# Patient Record
Sex: Female | Born: 1960 | Race: White | Hispanic: No | Smoking: Current some day smoker
Health system: Southern US, Community
[De-identification: ages and names within clinical notes are randomized; demographics above are authoritative.]

## PROBLEM LIST (undated history)

## (undated) ENCOUNTER — Emergency Department (HOSPITAL_COMMUNITY): Admission: EM | Payer: Self-pay | Source: Home / Self Care

## (undated) DIAGNOSIS — J449 Chronic obstructive pulmonary disease, unspecified: Secondary | ICD-10-CM

## (undated) DIAGNOSIS — C140 Malignant neoplasm of pharynx, unspecified: Secondary | ICD-10-CM

## (undated) DIAGNOSIS — K449 Diaphragmatic hernia without obstruction or gangrene: Secondary | ICD-10-CM

## (undated) DIAGNOSIS — M5136 Other intervertebral disc degeneration, lumbar region: Secondary | ICD-10-CM

## (undated) DIAGNOSIS — J40 Bronchitis, not specified as acute or chronic: Secondary | ICD-10-CM

## (undated) DIAGNOSIS — M51369 Other intervertebral disc degeneration, lumbar region without mention of lumbar back pain or lower extremity pain: Secondary | ICD-10-CM

## (undated) DIAGNOSIS — I10 Essential (primary) hypertension: Secondary | ICD-10-CM

## (undated) HISTORY — PX: OTHER SURGICAL HISTORY: SHX169

## (undated) HISTORY — PX: CHOLECYSTECTOMY: SHX55

## (undated) NOTE — *Deleted (*Deleted)
PROGRESS NOTE  HEIDIE KELLEN  DOB: 1960/06/10  PCP: [facility] [id number]  DOA: 03/10/2020  LOS: 7 days   Chief Complaint  Patient presents with  . Shortness of Breath   *** Brief narrative: Veronica Horton is a 75 y.o. female who presented to the ED on 03/10/2020 with shortness of breath. PMH significant for COPD on 5 L oxygen at home, hypertension. Initially admitted as COPD exacerbation on BiPAP. She underwent speech evaluation.  Barium study showed supraglottic soft tissue mass. CT scan of the neck showed a bulky soft tissue mass in the supraglottis with narrowing of the airway, involvement of the right laryngeal cartilage with extension to the strap muscles in the right paraglottic space, questionable bilateral cervical adenopathy concerning for regional metastasis. Oncology and ENT were consulted.   11/13, ENT did direct laryngoscopy with biopsy and tracheostomy on 11/13 and required transfer to intensive care. 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 11/15, she was transitioned to trach collar.  NG tube was placed for feeding. 11/15, CT chest/abdomen/pelvis with contrast did not show any distant metastatic disease to the chest, abdomen or pelvis.  11/16, Transferred out to hospitalist service  Subjective: Patient was seen and examined *** Chart reviewed. No fever, hemodynamically stable, on 10 L oxygen via trach collar.  Assessment/Plan: Acute on chronic hypoxic respiratory failure -Chronically on oxygen 5 L at home because of COPD. -Acute worsening of respiratory status secondary to laryngeal cancer.  Underwent tracheostomy.  Currently on 2 L by nasal cannula.   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 -Per oncology note from this morning, this was discussed at ENT tumor board.   Plan is to proceed with concurrent chemoradiation.  MRI neck with and without contrast ordered for staging and radiation planning.  Recommendation is to transfer her to [facility] for further treatment. -Currently on Solu-Medrol to 40 milligrams IV daily -continue nebs and Breo, flutter valve and incentive spirometry -Attempt trach collar 24 hours, will involve speech and swallow to advance with PM valve  Aspiration pneumonia -Serratia marcescens isolated in sputum culture -continue Levaquin plan 7 to 10 days  GERD -on PPI  HTN -Currently controlled without home Lasix or lisinopril  Mild AKI -improving Recent Labs    01/23/20 0234 01/24/20 0425 03/10/20 1226 03/12/20 0352 03/13/20 0849 03/14/20 0824 03/15/20 0105 03/16/20 0613 03/17/20 0245  BUN 13 23* 16 33* 37* 39* 37* 35* 31*  CREATININE 0.92 0.94 1.01* 1.10* 1.16* 1.10* 1.20* 1.13* 1.10*   Rib fractures -CT chest noted ununited fractures along posterior LEFT chest involving ribs 10 through 12. These appear subacute or chronic. These did not appear to be present on previous imaging from September of 2020. -***     Mobility: *** Code Status:   Code Status: Full Code *** Nutritional status: Body mass index is 41.27 kg/m. Nutrition Problem: Increased nutrient needs Etiology: cancer and cancer related treatments, chronic illness Signs/Symptoms: estimated needs Diet Order            Diet NPO time specified  Diet effective now                *** DVT prophylaxis: SCDs Start: 03/10/20 1625   Antimicrobials: *** Fluid: *** Consultants: *** Family Communication: ***  Status is: Inpatient  {Inpatient:23812}  Dispo: The patient is from: {From:23814}  Anticipated d/c is to: {To:23815}              Anticipated d/c date is: {Days:23816}              Patient currently {Medically stable:23817}       Infusions:  . feeding supplement (OSMOLITE 1.5 CAL) 1,000 mL (03/16/20 1901)  .  levofloxacin (LEVAQUIN) IV 750 mg (03/16/20 0921)    Scheduled Meds: . arformoterol  15 mcg Nebulization BID  . budesonide (PULMICORT) nebulizer solution  0.5 mg Nebulization BID  . chlorhexidine  15 mL Mouth Rinse BID  . chlorhexidine gluconate (MEDLINE KIT)  15 mL Mouth Rinse BID  . Chlorhexidine Gluconate Cloth  6 each Topical Daily  . dextromethorphan-guaiFENesin  1 tablet Oral BID  . enoxaparin (LOVENOX) injection  55 mg Subcutaneous Q24H  . feeding supplement (PROSource TF)  45 mL Per Tube BID  . insulin aspart  0-9 Units Subcutaneous Q4H  . ipratropium-albuterol  3 mL Nebulization Q6H  . mouth rinse  15 mL Mouth Rinse 10 times per day  . methylPREDNISolone (SOLU-MEDROL) injection  40 mg Intravenous Daily  . multivitamin with minerals  1 tablet Oral Daily  . nicotine  21 mg Transdermal Daily  . pantoprazole (PROTONIX) IV  40 mg Intravenous Q24H  . pneumococcal 23 valent vaccine  0.5 mL Intramuscular Tomorrow-1000    Antimicrobials: Anti-infectives (From admission, onward)   Start     Dose/Rate Route Frequency Ordered Stop   03/14/20 1100  levofloxacin (LEVAQUIN) IVPB 750 mg        750 mg 100 mL/hr over 90 Minutes Intravenous Daily 03/14/20 0955 03/20/20 0959   03/10/20 1730  doxycycline (VIBRAMYCIN) 100 mg in sodium chloride 0.9 % 250 mL IVPB  Status:  Discontinued        100 mg 125 mL/hr over 120 Minutes Intravenous Every 12 hours 03/10/20 1634 03/14/20 0955   03/10/20 1600  azithromycin (ZITHROMAX) 500 mg in sodium chloride 0.9 % 250 mL IVPB  Status:  Discontinued        500 mg 250 mL/hr over 60 Minutes Intravenous Every 24 hours 03/10/20 1529 03/10/20 1538   03/10/20 1500  clindamycin (CLEOCIN) IVPB 600 mg  Status:  Discontinued        600 mg 100 mL/hr over 30 Minutes Intravenous  Once 03/10/20 1458 03/10/20 1521      PRN meds: fentaNYL (SUBLIMAZE) injection, ipratropium-albuterol   Objective: Vitals:   03/17/20 0729 03/17/20 0755  BP: 112/68   Pulse: 93    Resp: (!) 21   Temp:  98 F (36.7 C)  SpO2:      Intake/Output Summary (Last 24 hours) at 03/17/2020 0836 Last data filed at 03/17/2020 0600 Gross per 24 hour  Intake 330 ml  Output 900 ml  Net -570 ml   Filed Weights   03/14/20 0500 03/16/20 0500 03/17/20 0500  Weight: 111.7 kg 112.4 kg 112.5 kg   Weight change: 0.1 kg Body mass index is 41.27 kg/m.   Physical Exam: General exam: Appears calm and comfortable. *** Skin: No rashes, lesions or ulcers. HEENT: Atraumatic, normocephalic, supple neck, no obvious bleeding Lungs: *** CVS: *** GI/Abd *** CNS: *** Psychiatry: *** Extremities: ***  Data Review: I have personally reviewed the laboratory data and studies available.  Recent Labs  Lab 03/10/20 1227 03/12/20 0352 03/13/20 0849 03/14/20 0824 03/15/20 0105 03/16/20 0613 03/17/20 0245  WBC 10.1   < > 10.2 10.6* 8.2 6.9 9.5  NEUTROABS 8.1*  --   --   --  7.3 5.5 7.4  HGB 14.5   < > 15.0 15.5* 14.6 14.1 14.5  HCT 47.2*   < > 47.4* 47.8* 45.4 44.8 46.2*  MCV 101.9*   < > 97.1 98.0 98.3 98.2 99.1  PLT 220   < > 208 203 146* 121* 112*   < > = values in this interval not displayed.   Recent Labs  Lab 03/10/20 1600 03/12/20 0352 03/13/20 0849 03/14/20 0824 03/15/20 0105 03/15/20 1621 03/15/20 1728 03/16/20 0613 03/17/20 0245  NA  --    < > 140 141 141  --   --  144 145  K  --    < > 4.3 4.5 4.3  --   --  3.9 4.3  CL  --    < > 98 101 104  --   --  105 104  CO2  --    < > 32 28 30  --   --  30 32  GLUCOSE  --    < > 103* 112* 108*  --   --  126* 116*  BUN  --    < > 37* 39* 37*  --   --  35* 31*  CREATININE  --    < > 1.16* 1.10* 1.20*  --   --  1.13* 1.10*  CALCIUM  --    < > 9.3 8.9 8.6*  --   --  8.8* 8.6*  MG 2.3  --   --   --   --  2.7* 2.7* 2.6* 2.6*  PHOS  --   --   --   --   --  3.2 3.4 3.2 3.1   < > = values in this interval not displayed.    F/u labs ***  Signed, Terrilee Croak, MD [facility] 03/17/2020

---

## 2001-03-16 ENCOUNTER — Emergency Department (HOSPITAL_COMMUNITY): Admission: EM | Admit: 2001-03-16 | Discharge: 2001-03-16 | Payer: Self-pay | Admitting: *Deleted

## 2001-08-04 ENCOUNTER — Emergency Department (HOSPITAL_COMMUNITY): Admission: EM | Admit: 2001-08-04 | Discharge: 2001-08-04 | Payer: Self-pay | Admitting: *Deleted

## 2001-08-04 ENCOUNTER — Encounter: Payer: Self-pay | Admitting: *Deleted

## 2007-03-09 ENCOUNTER — Emergency Department (HOSPITAL_COMMUNITY): Admission: EM | Admit: 2007-03-09 | Discharge: 2007-03-09 | Payer: Self-pay | Admitting: Emergency Medicine

## 2007-12-03 ENCOUNTER — Emergency Department (HOSPITAL_COMMUNITY): Admission: EM | Admit: 2007-12-03 | Discharge: 2007-12-03 | Payer: Self-pay | Admitting: Emergency Medicine

## 2010-09-25 ENCOUNTER — Emergency Department (HOSPITAL_COMMUNITY)
Admission: EM | Admit: 2010-09-25 | Discharge: 2010-09-25 | Disposition: A | Discharge status: Home / Self Care | Payer: Self-pay | Attending: Emergency Medicine | Admitting: Emergency Medicine

## 2010-09-25 ENCOUNTER — Emergency Department (HOSPITAL_COMMUNITY): Payer: Self-pay

## 2010-09-25 DIAGNOSIS — K439 Ventral hernia without obstruction or gangrene: Secondary | ICD-10-CM | POA: Insufficient documentation

## 2010-09-25 DIAGNOSIS — R1013 Epigastric pain: Secondary | ICD-10-CM | POA: Insufficient documentation

## 2010-09-25 DIAGNOSIS — F17200 Nicotine dependence, unspecified, uncomplicated: Secondary | ICD-10-CM | POA: Insufficient documentation

## 2010-09-25 LAB — DIFFERENTIAL
Basophils Absolute: 0 10*3/uL (ref 0.0–0.1)
Basophils Relative: 0 % (ref 0–1)
Eosinophils Absolute: 0.1 10*3/uL (ref 0.0–0.7)
Eosinophils Relative: 0 % (ref 0–5)
Lymphocytes Relative: 11 % — ABNORMAL LOW (ref 12–46)
Lymphs Abs: 1.4 10*3/uL (ref 0.7–4.0)
Monocytes Absolute: 0.8 10*3/uL (ref 0.1–1.0)
Monocytes Relative: 6 % (ref 3–12)
Neutro Abs: 10 10*3/uL — ABNORMAL HIGH (ref 1.7–7.7)
Neutrophils Relative %: 82 % — ABNORMAL HIGH (ref 43–77)

## 2010-09-25 LAB — COMPREHENSIVE METABOLIC PANEL
Alkaline Phosphatase: 73 U/L (ref 39–117)
BUN: 19 mg/dL (ref 6–23)
Chloride: 98 mEq/L (ref 96–112)
Glucose, Bld: 116 mg/dL — ABNORMAL HIGH (ref 70–99)
Potassium: 3.1 mEq/L — ABNORMAL LOW (ref 3.5–5.1)
Total Bilirubin: 0.5 mg/dL (ref 0.3–1.2)

## 2010-09-25 LAB — CBC
HCT: 49.3 % — ABNORMAL HIGH (ref 36.0–46.0)
Hemoglobin: 16.9 g/dL — ABNORMAL HIGH (ref 12.0–15.0)
MCH: 33.1 pg (ref 26.0–34.0)
MCHC: 34.3 g/dL (ref 30.0–36.0)
MCV: 96.5 fL (ref 78.0–100.0)
Platelets: 204 10*3/uL (ref 150–400)
RBC: 5.11 MIL/uL (ref 3.87–5.11)
RDW: 13.8 % (ref 11.5–15.5)
WBC: 12.3 10*3/uL — ABNORMAL HIGH (ref 4.0–10.5)

## 2010-09-25 LAB — URINALYSIS, ROUTINE W REFLEX MICROSCOPIC
Ketones, ur: NEGATIVE mg/dL
Nitrite: NEGATIVE
Protein, ur: NEGATIVE mg/dL
Urobilinogen, UA: 0.2 mg/dL (ref 0.0–1.0)

## 2010-09-25 LAB — LIPASE, BLOOD: Lipase: 42 U/L (ref 11–59)

## 2010-09-25 MED ORDER — IOHEXOL 300 MG/ML  SOLN
100.0000 mL | Freq: Once | INTRAMUSCULAR | Status: AC | PRN
  Administered 2010-09-25: 100 mL via INTRAVENOUS

## 2010-10-03 ENCOUNTER — Emergency Department (HOSPITAL_COMMUNITY): Payer: Self-pay

## 2010-10-03 ENCOUNTER — Emergency Department (HOSPITAL_COMMUNITY)
Admission: EM | Admit: 2010-10-03 | Discharge: 2010-10-03 | Disposition: A | Discharge status: Home / Self Care | Payer: Self-pay | Attending: Emergency Medicine | Admitting: Emergency Medicine

## 2010-10-03 ENCOUNTER — Encounter (HOSPITAL_COMMUNITY): Payer: Self-pay | Admitting: Radiology

## 2010-10-03 DIAGNOSIS — F17200 Nicotine dependence, unspecified, uncomplicated: Secondary | ICD-10-CM | POA: Insufficient documentation

## 2010-10-03 DIAGNOSIS — R935 Abnormal findings on diagnostic imaging of other abdominal regions, including retroperitoneum: Secondary | ICD-10-CM | POA: Insufficient documentation

## 2010-10-03 DIAGNOSIS — Z1389 Encounter for screening for other disorder: Secondary | ICD-10-CM | POA: Insufficient documentation

## 2010-10-03 HISTORY — DX: Essential (primary) hypertension: I10

## 2010-10-03 LAB — BASIC METABOLIC PANEL
BUN: 15 mg/dL (ref 6–23)
Creatinine, Ser: 0.96 mg/dL (ref 0.4–1.2)
GFR calc non Af Amer: >60 mL/min (ref >60)

## 2010-10-03 MED ORDER — IOHEXOL 300 MG/ML  SOLN
80.0000 mL | Freq: Once | INTRAMUSCULAR | Status: AC | PRN
  Administered 2010-10-03: 80 mL via INTRAVENOUS

## 2012-05-14 ENCOUNTER — Inpatient Hospital Stay (HOSPITAL_COMMUNITY)
Admission: EM | Admit: 2012-05-14 | Discharge: 2012-05-17 | DRG: 394 | Disposition: A | Discharge status: Home / Self Care | Payer: MEDICAID | Attending: General Surgery | Admitting: General Surgery

## 2012-05-14 ENCOUNTER — Emergency Department (HOSPITAL_COMMUNITY): Payer: Self-pay

## 2012-05-14 ENCOUNTER — Encounter (HOSPITAL_COMMUNITY): Payer: Self-pay | Admitting: *Deleted

## 2012-05-14 DIAGNOSIS — D72829 Elevated white blood cell count, unspecified: Secondary | ICD-10-CM | POA: Diagnosis present

## 2012-05-14 DIAGNOSIS — K436 Other and unspecified ventral hernia with obstruction, without gangrene: Principal | ICD-10-CM | POA: Diagnosis present

## 2012-05-14 DIAGNOSIS — Z6841 Body Mass Index (BMI) 40.0 and over, adult: Secondary | ICD-10-CM

## 2012-05-14 DIAGNOSIS — Z23 Encounter for immunization: Secondary | ICD-10-CM

## 2012-05-14 DIAGNOSIS — J40 Bronchitis, not specified as acute or chronic: Secondary | ICD-10-CM | POA: Diagnosis present

## 2012-05-14 DIAGNOSIS — I10 Essential (primary) hypertension: Secondary | ICD-10-CM | POA: Diagnosis present

## 2012-05-14 DIAGNOSIS — R109 Unspecified abdominal pain: Secondary | ICD-10-CM

## 2012-05-14 HISTORY — DX: Bronchitis, not specified as acute or chronic: J40

## 2012-05-14 HISTORY — DX: Diaphragmatic hernia without obstruction or gangrene: K44.9

## 2012-05-14 LAB — COMPREHENSIVE METABOLIC PANEL
ALT: 15 U/L (ref 0–35)
AST: 14 U/L (ref 0–37)
Albumin: 4 g/dL (ref 3.5–5.2)
CO2: 24 mEq/L (ref 19–32)
Calcium: 9.8 mg/dL (ref 8.4–10.5)
GFR calc non Af Amer: 77 mL/min — ABNORMAL LOW (ref >90)
Sodium: 140 mEq/L (ref 135–145)
Total Protein: 8.1 g/dL (ref 6.0–8.3)

## 2012-05-14 LAB — CBC WITH DIFFERENTIAL/PLATELET
Basophils Absolute: 0 10*3/uL (ref 0.0–0.1)
Eosinophils Relative: 0 % (ref 0–5)
Lymphocytes Relative: 8 % — ABNORMAL LOW (ref 12–46)
MCV: 91.3 fL (ref 78.0–100.0)
Platelets: 214 10*3/uL (ref 150–400)
RDW: 13.6 % (ref 11.5–15.5)
WBC: 14.5 10*3/uL — ABNORMAL HIGH (ref 4.0–10.5)

## 2012-05-14 MED ORDER — ENOXAPARIN SODIUM 40 MG/0.4ML ~~LOC~~ SOLN
40.0000 mg | SUBCUTANEOUS | Status: DC
  Administered 2012-05-14 – 2012-05-16 (×3): 40 mg via SUBCUTANEOUS
  Filled 2012-05-14 (×3): qty 0.4

## 2012-05-14 MED ORDER — SODIUM CHLORIDE 0.9 % IV BOLUS (SEPSIS)
1000.0000 mL | Freq: Once | INTRAVENOUS | Status: AC
  Administered 2012-05-14: 1000 mL via INTRAVENOUS

## 2012-05-14 MED ORDER — ONDANSETRON HCL 4 MG/2ML IJ SOLN
4.0000 mg | Freq: Once | INTRAMUSCULAR | Status: AC
  Administered 2012-05-14: 4 mg via INTRAVENOUS
  Filled 2012-05-14 (×2): qty 2

## 2012-05-14 MED ORDER — ONDANSETRON HCL 4 MG/2ML IJ SOLN
4.0000 mg | Freq: Once | INTRAMUSCULAR | Status: AC
  Administered 2012-05-14: 4 mg via INTRAVENOUS
  Filled 2012-05-14: qty 2

## 2012-05-14 MED ORDER — HYDROMORPHONE HCL PF 1 MG/ML IJ SOLN
1.0000 mg | INTRAMUSCULAR | Status: DC | PRN
  Administered 2012-05-14 – 2012-05-15 (×2): 1 mg via INTRAVENOUS
  Filled 2012-05-14 (×2): qty 1

## 2012-05-14 MED ORDER — PANTOPRAZOLE SODIUM 40 MG IV SOLR
40.0000 mg | Freq: Every day | INTRAVENOUS | Status: DC
  Administered 2012-05-14 – 2012-05-16 (×3): 40 mg via INTRAVENOUS
  Filled 2012-05-14 (×3): qty 40

## 2012-05-14 MED ORDER — IOHEXOL 300 MG/ML  SOLN
100.0000 mL | Freq: Once | INTRAMUSCULAR | Status: AC | PRN
  Administered 2012-05-14: 100 mL via INTRAVENOUS

## 2012-05-14 MED ORDER — IOHEXOL 300 MG/ML  SOLN
50.0000 mL | Freq: Once | INTRAMUSCULAR | Status: AC | PRN
  Administered 2012-05-14: 50 mL via ORAL

## 2012-05-14 MED ORDER — INFLUENZA VIRUS VACC SPLIT PF IM SUSP
0.5000 mL | INTRAMUSCULAR | Status: AC
  Administered 2012-05-15: 0.5 mL via INTRAMUSCULAR
  Filled 2012-05-14: qty 0.5

## 2012-05-14 MED ORDER — ONDANSETRON HCL 4 MG/2ML IJ SOLN
4.0000 mg | Freq: Four times a day (QID) | INTRAMUSCULAR | Status: DC | PRN
  Administered 2012-05-15 (×2): 4 mg via INTRAVENOUS
  Filled 2012-05-14 (×2): qty 2

## 2012-05-14 MED ORDER — MORPHINE SULFATE 4 MG/ML IJ SOLN
4.0000 mg | Freq: Once | INTRAMUSCULAR | Status: AC
  Administered 2012-05-14: 4 mg via INTRAVENOUS
  Filled 2012-05-14: qty 1

## 2012-05-14 MED ORDER — PNEUMOCOCCAL VAC POLYVALENT 25 MCG/0.5ML IJ INJ
0.5000 mL | INJECTION | INTRAMUSCULAR | Status: AC
  Administered 2012-05-15: 0.5 mL via INTRAMUSCULAR
  Filled 2012-05-14: qty 0.5

## 2012-05-14 MED ORDER — LACTATED RINGERS IV SOLN
INTRAVENOUS | Status: DC
  Administered 2012-05-14 – 2012-05-17 (×6): via INTRAVENOUS

## 2012-05-14 NOTE — ED Notes (Signed)
abd pain, vomiting, fever ,onset this am.  No diarrhea.

## 2012-05-14 NOTE — ED Provider Notes (Signed)
History  This chart was scribed for Veryl Speak, MD by Roe Coombs, ED Scribe. The patient was seen in room APA01/APA01. Patient's care was started at 1328.  CSN: [id number]  Arrival date & time 05/14/12  52   First MD Initiated Contact with Patient 05/14/12 1328      Chief Complaint  Patient presents with  . Abdominal Pain     The history is provided by the patient. No language interpreter was used.    HPI Comments: Veronica Horton is a 52 y.o. female with history of hernia and hypertension who presents to the Emergency Department complaining of moderate, constant, non-radiating abdominal pain at hernia site, vomiting and fever onset this morning. Patient denies diarrhea, headache, congestion, visual disturbance, chest pain, cough, dysuria, hematuria, back pain, or rash. Patient has not had a bowel movement today, but she is passing air. Patient is not sure how the hernia developed, but she has been evaluated here for this problem before. Per medical records, she was seen for similar pain in May 2012 and had a CT which showed large periumbilical ventral hernia containing small bowel loops and no evidence of associated strangulation or incarceration by imaging. She states that she has seen a neurosurgeon, but she did not state medical plans for treatment.    Past Medical History  Diagnosis Date  . Hypertension   . Bronchitis   . Hiatal hernia     Past Surgical History  Procedure Date  . Cholecystectomy   . Degenerative bone disease     History reviewed. No pertinent family history.  History  Substance Use Topics  . Smoking status: Current Every Day Smoker  . Smokeless tobacco: Not on file  . Alcohol Use: No    OB History    Grav Para Term Preterm Abortions TAB SAB Ect Mult Living                  Review of Systems  Constitutional: Positive for fever.  HENT: Negative for congestion.   Eyes: Negative for visual disturbance.  Respiratory: Negative for cough.     Cardiovascular: Negative for chest pain.  Gastrointestinal: Positive for vomiting and abdominal pain. Negative for diarrhea.  Genitourinary: Negative for dysuria and hematuria.  Musculoskeletal: Negative for back pain.  Skin: Negative for rash.  Neurological: Negative for headaches.  Psychiatric/Behavioral: Negative.     Allergies  Codeine and Penicillins  Home Medications  No current outpatient prescriptions on file.  Triage Vitals: BP 185/84  Pulse 50  Temp 97.1 F (36.2 C) (Oral)  Resp 18  Ht 5' 5.5" (1.664 m)  SpO2 95%  Physical Exam  Constitutional: She is oriented to person, place, and time. She appears well-developed and well-nourished. No distress.  HENT:  Head: Normocephalic and atraumatic.  Mouth/Throat: Oropharynx is clear and moist.  Eyes: Conjunctivae normal and EOM are normal. Pupils are equal, round, and reactive to light.  Neck: Neck supple.  Cardiovascular: Normal rate and regular rhythm.   No murmur heard. Pulmonary/Chest: Effort normal and breath sounds normal. No respiratory distress. She has no wheezes. She has no rales.  Abdominal: Soft. Bowel sounds are normal. There is tenderness.       Large ventral hernia present that is firm. Is also tender to palpation and non reducible.   Musculoskeletal: Normal range of motion.  Neurological: She is alert and oriented to person, place, and time.  Skin: Skin is warm and dry.  Psychiatric: She has a normal mood and affect. Her  behavior is normal.    ED Course  Procedures (including critical care time) DIAGNOSTIC STUDIES: Oxygen Saturation is 95% on room air, adequate by my interpretation.    COORDINATION OF CARE: 1:43 PM- Patient informed of current plan for treatment and evaluation and agrees with plan at this time.      Labs Reviewed - No data to display No results found.   No diagnosis found.    MDM  The patient presents here with abd pain and vomiting.  She has a large ventral hernia that  she has had for years.  It is more painful.  It is tender to the exam and feels firm.  The acute abd series does not suggest obstruction and ct scan reveals findings that could potentially suggest incarceration.  I have spoken with Dr. Geroge Baseman who believes the patient should be admitted for hydration and observation.  She will be admitted to his service.      I personally performed the services described in this documentation, which was scribed in my presence. The recorded information has been reviewed and is accurate.      Veryl Speak, MD 05/14/12 2153

## 2012-05-14 NOTE — H&P (Signed)
Veronica Horton is an 52 y.o. female.   Chief Complaint: Abdominal pain HPI: Patient presented to [facility] with increasing nausea and diffuse abdominal pain. She had similar symptomatology in the past with suspected bowel obstruction. She has a known large ventral hernia. This is slowly increased in size. She has occasional in her mid and episodes where the hernia is enlarged and tight. She denies any emesis. No change in bowel movements. Her last bowel movement was yesterday. No melena or hematochezia. Only prior abdominal surgery was a cholecystectomy. She denies any fevers. No sick contacts. No unusual travel or exposure.  Past Medical History  Diagnosis Date  . Hypertension   . Bronchitis   . Hiatal hernia     Past Surgical History  Procedure Date  . Cholecystectomy   . Degenerative bone disease     History reviewed. No pertinent family history. Social History:  reports that she has been smoking.  She does not have any smokeless tobacco history on file. She reports that she does not drink alcohol. Her drug history not on file.  Allergies:  Allergies  Allergen Reactions  . Codeine Hives  . Penicillins Hives     (Not in a hospital admission)  Results for orders placed during the hospital encounter of 05/14/12 (from the past 48 hour(s))  CBC WITH DIFFERENTIAL     Status: Abnormal   Collection Time   05/14/12  1:50 PM      Component Value Range Comment   WBC 14.5 (*) 4.0 - 10.5 K/uL    RBC 5.49 (*) 3.87 - 5.11 MIL/uL    Hemoglobin 17.8 (*) 12.0 - 15.0 g/dL    HCT 50.1 (*) 36.0 - 46.0 %    MCV 91.3  78.0 - 100.0 fL    MCH 32.4  26.0 - 34.0 pg    MCHC 35.5  30.0 - 36.0 g/dL    RDW 13.6  11.5 - 15.5 %    Platelets 214  150 - 400 K/uL    Neutrophils Relative 87 (*) 43 - 77 %    Neutro Abs 12.7 (*) 1.7 - 7.7 K/uL    Lymphocytes Relative 8 (*) 12 - 46 %    Lymphs Abs 1.1  0.7 - 4.0 K/uL    Monocytes Relative 5  3 - 12 %    Monocytes Absolute 0.7  0.1 - 1.0 K/uL      Eosinophils Relative 0  0 - 5 %    Eosinophils Absolute 0.1  0.0 - 0.7 K/uL    Basophils Relative 0  0 - 1 %    Basophils Absolute 0.0  0.0 - 0.1 K/uL   COMPREHENSIVE METABOLIC PANEL     Status: Abnormal   Collection Time   05/14/12  1:50 PM      Component Value Range Comment   Sodium 140  135 - 145 mEq/L    Potassium 3.7  3.5 - 5.1 mEq/L    Chloride 102  96 - 112 mEq/L    CO2 24  19 - 32 mEq/L    Glucose, Bld 147 (*) 70 - 99 mg/dL    BUN 17  6 - 23 mg/dL    Creatinine, Ser 0.85  0.50 - 1.10 mg/dL    Calcium 9.8  8.4 - 10.5 mg/dL    Total Protein 8.1  6.0 - 8.3 g/dL    Albumin 4.0  3.5 - 5.2 g/dL    AST 14  0 - 37 U/L  ALT 15  0 - 35 U/L    Alkaline Phosphatase 80  39 - 117 U/L    Total Bilirubin 0.4  0.3 - 1.2 mg/dL    GFR calc non Af Amer 77 (*) >90 mL/min    GFR calc Af Amer 90 (*) >90 mL/min   LIPASE, BLOOD     Status: Normal   Collection Time   05/14/12  1:50 PM      Component Value Range Comment   Lipase 23  11 - 59 U/L    Ct Abdomen Pelvis W Contrast  05/14/2012  *RADIOLOGY REPORT*  Clinical Data: Abdominal pain, large ventral hernia, fever, vomiting, hypertension, prior cholecystectomy and right salpingectomy  CT ABDOMEN AND PELVIS WITH CONTRAST  Technique:  Multidetector CT imaging of the abdomen and pelvis was performed following the standard protocol during bolus administration of intravenous contrast. Sagittal and coronal MPR images reconstructed from axial data set.  Contrast: 18mL OMNIPAQUE IOHEXOL 300 MG/ML  SOLN orally, 1103mL OMNIPAQUE IOHEXOL 300 MG/ML  SOLN  Comparison: 09/25/2010  Findings: Linear scarring at lung bases. Small left renal cysts. Liver, spleen, pancreas, kidneys, and adrenal glands otherwise normal appearance. Gallbladder surgically absent. Large ventral hernia, umbilical, fascial defects 6.9 x 5.0 cm in size. Herniation of multiple small bowel loops with associated mesenteric edema. One of the small bowel loops within the hernia sac is minimally  distended up to 3.1 cm diameter but no definite evidence of bowel obstruction is identified.  No definite bowel wall thickening. Colon and remaining small bowel loops decompressed. Appendix not visualized. Unremarkable bladder and uterus. No mass, adenopathy, or free fluid. No free intraperitoneal air or acute osseous findings.  IMPRESSION: Large ventral hernia containing nonobstructed small bowel loops. A single small bowel loop within the hernia sac is minimally distended though remaining small bowel loops and colon are decompressed; no definite evidence of bowel obstruction is identified. Mesenteric edema associated with the herniated loops, nonspecific but could reflect incarceration.   Original Report Authenticated By: Lavonia Dana, M.D.    Dg Abd Acute W/chest  05/14/2012  *RADIOLOGY REPORT*  Clinical Data: Vomiting, abdominal pain, history of ventral hernia  ACUTE ABDOMEN SERIES (ABDOMEN 2 VIEW & CHEST 1 VIEW)  Comparison: Chest radiograph 09/25/2010 Correlation:  CT abdomen and pelvis 09/25/2010  Findings: Upper normal heart size. Mediastinal contours and pulmonary vascularity normal. Chronic right basilar atelectasis. Underlying emphysematous changes. Minimal chronic peribronchial thickening. No infiltrate, pleural effusion, or pneumothorax. Surgical clips right upper quadrant likely cholecystectomy. Nonobstructive bowel gas pattern. No bowel dilatation, bowel wall thickening or free intraperitoneal air. Bones unremarkable and no urinary tract calcification seen.  IMPRESSION: Question COPD with chronic right basilar atelectasis. No acute abdominal findings.   Original Report Authenticated By: Lavonia Dana, M.D.     Review of Systems  Constitutional: Positive for chills. Negative for fever, weight loss, malaise/fatigue and diaphoresis.  HENT: Negative.   Eyes: Negative.   Respiratory: Positive for cough.   Cardiovascular: Negative.   Gastrointestinal: Positive for heartburn, nausea and abdominal pain  (diffuse moderate). Negative for vomiting, diarrhea, constipation, blood in stool and melena.  Genitourinary: Negative.   Musculoskeletal: Negative.   Skin: Negative.   Neurological: Negative.  Negative for weakness.  Endo/Heme/Allergies: Negative.   Psychiatric/Behavioral: Negative.     Blood pressure 175/87, pulse 68, temperature 98.6 F (37 C), temperature source Oral, resp. rate 18, height 5' 5.5" (1.664 m), SpO2 92.00%. Physical Exam  Constitutional: She is oriented to person, place, and time. She appears well-developed  and well-nourished. No distress.       Morbidly obese  HENT:  Head: Normocephalic and atraumatic.  Eyes: Conjunctivae normal and EOM are normal. Pupils are equal, round, and reactive to light. No scleral icterus.  Neck: Normal range of motion. Neck supple. No tracheal deviation present. No thyromegaly present.  Cardiovascular: Normal rate, regular rhythm and normal heart sounds.   Respiratory: Effort normal and breath sounds normal. No respiratory distress.  GI: Soft. Bowel sounds are normal. She exhibits distension. She exhibits no mass. There is tenderness (mild to moderate no peritoneal signs). There is no rebound and no guarding.       Large ventral hernia with bowel sounds.  Musculoskeletal: Normal range of motion.  Lymphadenopathy:    She has no cervical adenopathy.  Neurological: She is alert and oriented to person, place, and time.  Skin: Skin is warm and dry.     Assessment/Plan Large ventral hernia, nausea, abdominal pain. At this point I have a low suspicion of the hernia leading to her etiology. I suspect she may have a viral gastroenteritis contributing to some of her symptomatology although a partial bowel obstruction due to the hernia cannot be overruled. At this time I have discussed with the patient admission with IV fluid hydration and bowel rest. Clinically her exam is benign and I do not feel that this extremely large hernia needs to be urgently  repaired. We did discuss repair it due to its large size I do feel this will be a complicated repair. Additionally do to its long chronic course as well as its large fascial opening I feel this is quite likely chronically incarcerated hernia and do not suspect any strangulation at this time. Indications to urgently proceed to the operating room have been discussed with the patient. We'll continue to monitor her closely. She is agreeable to the planned and discussed treatment course.  Joshiah Traynham C 05/14/2012, 7:33 PM

## 2012-05-15 LAB — BASIC METABOLIC PANEL
BUN: 16 mg/dL (ref 6–23)
CO2: 29 mEq/L (ref 19–32)
Chloride: 102 mEq/L (ref 96–112)
Creatinine, Ser: 0.86 mg/dL (ref 0.50–1.10)
Glucose, Bld: 110 mg/dL — ABNORMAL HIGH (ref 70–99)
Potassium: 3.9 mEq/L (ref 3.5–5.1)

## 2012-05-15 LAB — CBC
HCT: 47.8 % — ABNORMAL HIGH (ref 36.0–46.0)
Hemoglobin: 16 g/dL — ABNORMAL HIGH (ref 12.0–15.0)
MCH: 31.3 pg (ref 26.0–34.0)
MCHC: 33.5 g/dL (ref 30.0–36.0)
MCV: 93.4 fL (ref 78.0–100.0)
RDW: 13.9 % (ref 11.5–15.5)

## 2012-05-15 MED ORDER — SODIUM CHLORIDE 0.9 % IJ SOLN
3.0000 mL | INTRAMUSCULAR | Status: DC | PRN
  Administered 2012-05-15: 3 mL via INTRAVENOUS

## 2012-05-15 MED ORDER — PROMETHAZINE HCL 25 MG/ML IJ SOLN
12.5000 mg | INTRAMUSCULAR | Status: DC | PRN
  Administered 2012-05-15: 12.5 mg via INTRAVENOUS
  Filled 2012-05-15: qty 1

## 2012-05-15 NOTE — Progress Notes (Signed)
UR Chart Review Completed  

## 2012-05-15 NOTE — Progress Notes (Signed)
Subjective: Still with some episodes of nausea. Pain actually has improved slightly but is still present. She denies any fevers or chills. No flatus.  Objective: Vital signs in last 24 hours: Temp:  [97.1 F (36.2 C)-98.6 F (37 C)] 97.8 F (36.6 C) (01/15 0636) Pulse Rate:  [50-72] 68  (01/15 0636) Resp:  [18-20] 20  (01/15 0636) BP: (155-185)/(81-87) 163/82 mmHg (01/15 0636) SpO2:  [92 %-95 %] 92 % (01/15 0636) Weight:  [127.4 kg (280 lb 13.9 oz)] 127.4 kg (280 lb 13.9 oz) (01/14 2021) Last BM Date: 05/13/12  Intake/Output from previous day: 01/14 0701 - 01/15 0700 In: 1492 [I.V.:1482; IV Piggyback:10] Out: 500 [Urine:500] Intake/Output this shift:    General appearance: alert and no distress GI: Quiet, soft, obese, mild to moderate abdominal wall tenderness. Hernias distended. No evidence of peritoneal signs.  Lab Results:   Basename 05/15/12 0506 05/14/12 1350  WBC 16.4* 14.5*  HGB 16.0* 17.8*  HCT 47.8* 50.1*  PLT 237 214   BMET  Basename 05/15/12 0506 05/14/12 1350  NA 139 140  K 3.9 3.7  CL 102 102  CO2 29 24  GLUCOSE 110* 147*  BUN 16 17  CREATININE 0.86 0.85  CALCIUM 9.4 9.8   PT/INR No results found for this basename: LABPROT:2,INR:2 in the last 72 hours ABG No results found for this basename: PHART:2,PCO2:2,PO2:2,HCO3:2 in the last 72 hours  Studies/Results: Ct Abdomen Pelvis W Contrast  05/14/2012  *RADIOLOGY REPORT*  Clinical Data: Abdominal pain, large ventral hernia, fever, vomiting, hypertension, prior cholecystectomy and right salpingectomy  CT ABDOMEN AND PELVIS WITH CONTRAST  Technique:  Multidetector CT imaging of the abdomen and pelvis was performed following the standard protocol during bolus administration of intravenous contrast. Sagittal and coronal MPR images reconstructed from axial data set.  Contrast: 15mL OMNIPAQUE IOHEXOL 300 MG/ML  SOLN orally, 14mL OMNIPAQUE IOHEXOL 300 MG/ML  SOLN  Comparison: 09/25/2010  Findings: Linear  scarring at lung bases. Small left renal cysts. Liver, spleen, pancreas, kidneys, and adrenal glands otherwise normal appearance. Gallbladder surgically absent. Large ventral hernia, umbilical, fascial defects 6.9 x 5.0 cm in size. Herniation of multiple small bowel loops with associated mesenteric edema. One of the small bowel loops within the hernia sac is minimally distended up to 3.1 cm diameter but no definite evidence of bowel obstruction is identified.  No definite bowel wall thickening. Colon and remaining small bowel loops decompressed. Appendix not visualized. Unremarkable bladder and uterus. No mass, adenopathy, or free fluid. No free intraperitoneal air or acute osseous findings.  IMPRESSION: Large ventral hernia containing nonobstructed small bowel loops. A single small bowel loop within the hernia sac is minimally distended though remaining small bowel loops and colon are decompressed; no definite evidence of bowel obstruction is identified. Mesenteric edema associated with the herniated loops, nonspecific but could reflect incarceration.   Original Report Authenticated By: Lavonia Dana, M.D.    Dg Abd Acute W/chest  05/14/2012  *RADIOLOGY REPORT*  Clinical Data: Vomiting, abdominal pain, history of ventral hernia  ACUTE ABDOMEN SERIES (ABDOMEN 2 VIEW & CHEST 1 VIEW)  Comparison: Chest radiograph 09/25/2010 Correlation:  CT abdomen and pelvis 09/25/2010  Findings: Upper normal heart size. Mediastinal contours and pulmonary vascularity normal. Chronic right basilar atelectasis. Underlying emphysematous changes. Minimal chronic peribronchial thickening. No infiltrate, pleural effusion, or pneumothorax. Surgical clips right upper quadrant likely cholecystectomy. Nonobstructive bowel gas pattern. No bowel dilatation, bowel wall thickening or free intraperitoneal air. Bones unremarkable and no urinary tract calcification seen.  IMPRESSION:  Question COPD with chronic right basilar atelectasis. No acute  abdominal findings.   Original Report Authenticated By: Lavonia Dana, M.D.     Anti-infectives: Anti-infectives    None      Assessment/Plan: s/p * No surgery found * Continue bowel rest. I will add Phenergan for nausea.  Leukocytosis has increased slightly. I still feel this is somewhat deep to hemoconcentration as her hemoglobin also remains elevated. Continue IV fluid hydration. Clinically patient is exam is reassuring as is her clinical progression however I do feel patient may require repair for his admission if she persists.  LOS: 1 day    Gabe Glace C 05/15/2012

## 2012-05-15 NOTE — Care Management Note (Unsigned)
    Page 1 of 1   05/15/2012     1:18:39 PM   CARE MANAGEMENT NOTE 05/15/2012  Patient:  Veronica Horton, Veronica Horton   Account Number:  [id number]  Date Initiated:  05/15/2012  Documentation initiated by:  Theophilus Kinds  Subjective/Objective Assessment:   Pt admitted from home with SBO 2nd to incarcerated hernia. Pt lives with her boyfriend and will return home at discharge. Pt is independent with ADL's.     Action/Plan:   Financial counselor spoke with pt about self pay status. Will continue to follow for possible need for medication assistance.   Anticipated DC Date:  05/18/2012   Anticipated DC Plan:  [place]  CM consult      Choice offered to / List presented to:             Status of service:  In process, will continue to follow Medicare Important Message given?   (If response is "NO", the following Medicare IM given date fields will be blank) Date Medicare IM given:   Date Additional Medicare IM given:    Discharge Disposition:    Per UR Regulation:    If discussed at Long Length of Stay Meetings, dates discussed:    Comments:  05/15/12 Nina, RN BSN CM

## 2012-05-16 LAB — BASIC METABOLIC PANEL
Calcium: 9.1 mg/dL (ref 8.4–10.5)
Creatinine, Ser: 0.83 mg/dL (ref 0.50–1.10)
GFR calc non Af Amer: 80 mL/min — ABNORMAL LOW (ref >90)
Sodium: 138 mEq/L (ref 135–145)

## 2012-05-16 LAB — CBC
MCH: 32 pg (ref 26.0–34.0)
MCHC: 33.9 g/dL (ref 30.0–36.0)
MCV: 94.5 fL (ref 78.0–100.0)
Platelets: 169 10*3/uL (ref 150–400)

## 2012-05-16 MED ORDER — BIOTENE DRY MOUTH MT LIQD
15.0000 mL | Freq: Two times a day (BID) | OROMUCOSAL | Status: DC
  Administered 2012-05-16 – 2012-05-17 (×3): 15 mL via OROMUCOSAL

## 2012-05-16 MED ORDER — CHLORHEXIDINE GLUCONATE 0.12 % MT SOLN
15.0000 mL | Freq: Two times a day (BID) | OROMUCOSAL | Status: DC
  Administered 2012-05-16 – 2012-05-17 (×3): 15 mL via OROMUCOSAL
  Filled 2012-05-16 (×3): qty 15

## 2012-05-16 NOTE — Progress Notes (Signed)
  Subjective: Nausea improved. No complaints of abdominal pain. Positive flatus and small bowel movement.  Objective: Vital signs in last 24 hours: Temp:  [97.9 F (36.6 C)-98.3 F (36.8 C)] 98.3 F (36.8 C) (01/16 1400) Pulse Rate:  [69-74] 74  (01/16 1400) Resp:  [20] 20  (01/16 1400) BP: (120-151)/(69-78) 130/74 mmHg (01/16 1400) SpO2:  [90 %-94 %] 90 % (01/16 1400) Last BM Date: 05/15/12  Intake/Output from previous day:   Intake/Output this shift: Total I/O In: 4676 [P.O.:240; I.V.:4426; IV Piggyback:10] Out: 400 [Urine:400]  General appearance: alert and no distress GI: soft, non-tender; bowel sounds normal; no masses,  no organomegaly and Ventral hernia is softer.  Lab Results:   [facility] 05/16/12 0951 05/15/12 0506  WBC 8.6 16.4*  HGB 15.2* 16.0*  HCT 44.9 47.8*  PLT 169 237   BMET  Basename 05/16/12 0951 05/15/12 0506  NA 138 139  K 3.7 3.9  CL 102 102  CO2 28 29  GLUCOSE 81 110*  BUN 17 16  CREATININE 0.83 0.86  CALCIUM 9.1 9.4   PT/INR No results found for this basename: LABPROT:2,INR:2 in the last 72 hours ABG No results found for this basename: PHART:2,PCO2:2,PO2:2,HCO3:2 in the last 72 hours  Studies/Results: No results found.  Anti-infectives: Anti-infectives    None      Assessment/Plan: s/p * No surgery found * Small bowel obstruction, large ventral hernia. At this time we'll plan to advance patient slowly on an oral diet. If patient fails to progress then we'll again discuss possible surgical intervention for the hernia and likely transition site for the small bowel obstruction. However as I previously discussed the patient, ideally patient can be treated conservatively with plans to repair the hernia electively as an outpatient. Patient is agreeable to discussed treatment plan.  LOS: 2 days    Cortni Tays C 05/16/2012

## 2012-05-17 LAB — CBC
HCT: 41.6 % (ref 36.0–46.0)
Hemoglobin: 13.9 g/dL (ref 12.0–15.0)
MCHC: 33.4 g/dL (ref 30.0–36.0)
RBC: 4.44 MIL/uL (ref 3.87–5.11)
WBC: 7.2 10*3/uL (ref 4.0–10.5)

## 2012-05-17 LAB — BASIC METABOLIC PANEL
BUN: 13 mg/dL (ref 6–23)
Chloride: 103 mEq/L (ref 96–112)
GFR calc non Af Amer: 73 mL/min — ABNORMAL LOW (ref >90)
Glucose, Bld: 91 mg/dL (ref 70–99)
Potassium: 3.6 mEq/L (ref 3.5–5.1)

## 2012-05-17 MED ORDER — PANTOPRAZOLE SODIUM 40 MG PO TBEC: 40.0000 mg | DELAYED_RELEASE_TABLET | Freq: Every day | ORAL | Status: DC

## 2012-05-17 NOTE — Progress Notes (Signed)
The patient is receiving Protonix by the intravenous route.  Based on criteria approved by the Pharmacy and [place], the medication is being converted to the equivalent oral dose form.  These criteria include: -No Active GI bleeding -Able to tolerate diet of full liquids (or better) or tube feeding OR able to tolerate other medications by the oral or enteral route  If you have any questions about this conversion, please contact the Pharmacy Department (ext [phone]).  Thank you.  Pricilla Larsson, [facility] 05/17/2012 1:31 PM

## 2012-05-17 NOTE — Progress Notes (Signed)
Pt advanced to soft diet.  Patient ate all of lunch.  Has had no c/o nausea or vomiting or pain.  Dr. Geroge Baseman notified.

## 2012-05-17 NOTE — Progress Notes (Signed)
  Subjective: Nausea has improved. Positive bowel movement. Tolerating clear liquids.  Objective: Vital signs in last 24 hours: Temp:  [97.7 F (36.5 C)-98.3 F (36.8 C)] 97.7 F (36.5 C) (01/17 0459) Pulse Rate:  [57-74] 57  (01/17 0459) Resp:  [18-20] 20  (01/17 0459) BP: (130-142)/(57-74) 137/62 mmHg (01/17 0459) SpO2:  [90 %-93 %] 93 % (01/17 0851) Last BM Date: 05/17/12  Intake/Output from previous day: 01/16 0701 - 01/17 0700 In: 7460 [P.O.:1800; I.V.:5640; IV Piggyback:20] Out: 2275 [Urine:2275] Intake/Output this shift: Total I/O In: 240 [P.O.:240] Out: 1 [Stool:1]  General appearance: alert and no distress GI: soft, non-tender; bowel sounds normal; no masses,  no organomegaly and Ventral hernia is soft with no discomfort.  Lab Results:   Basename 05/17/12 0542 05/16/12 0951  WBC 7.2 8.6  HGB 13.9 15.2*  HCT 41.6 44.9  PLT 165 169   BMET  Basename 05/17/12 0542 05/16/12 0951  NA 137 138  K 3.6 3.7  CL 103 102  CO2 30 28  GLUCOSE 91 81  BUN 13 17  CREATININE 0.89 0.83  CALCIUM 8.9 9.1   PT/INR No results found for this basename: LABPROT:2,INR:2 in the last 72 hours ABG No results found for this basename: PHART:2,PCO2:2,PO2:2,HCO3:2 in the last 72 hours  Studies/Results: No results found.  Anti-infectives: Anti-infectives    None      Assessment/Plan: s/p * No surgery found * SBO, a large ventral hernia. At this time we'll continue to advance diet. Likely discharge later today if tolerates regular diet. Have discussed with patient followup as an outpatient to further discuss surgical options for eventual repair of her extremely large ventral hernia  LOS: 3 days    Ariaunna Longsworth C 05/17/2012

## 2012-05-18 NOTE — Progress Notes (Signed)
Reviewed AVS with patient.  Patient verbalized understanding of d/c instructions.  Teach back method used.  Pt transported via w/c by NT to main entrance for d/c.  Pt stable at time of d/c.

## 2012-06-05 NOTE — Discharge Summary (Signed)
Physician Discharge Summary  Patient ID: Veronica Horton MRN: [id number] DOB/AGE: [birth year] 52 y.o.  Admit date: 05/14/2012 Discharge date: 05/17/2012  Admission Diagnoses:  Small Bowel Obstruction  Discharge Diagnoses: The same. Active Problems:  * No active hospital problems. *    Discharged Condition: stable  Hospital Course: Patient presented with nausea, vomiting, and abdominal pain  Patient admitted for SBO.  Treated conservatively.  Noted to have large chronically incarcerated ventral hernia.  Began to have bowel function.  Was slowly advanced on her diet.  TOlerated well.  Patient ready for discharge.    Consults: None  Significant Diagnostic Studies: labs: daily.  CT abdomen and pelvis.  Treatments: IV hydration  Discharge Exam: Blood pressure 125/63, pulse 70, temperature 97.8 F (36.6 C), temperature source Oral, resp. rate 20, height 5\' 5"  (1.651 m), weight 127.4 kg (280 lb 13.9 oz), SpO2 97.00%. General appearance: alert and no distress Resp: clear to auscultation bilaterally Cardio: regular rate and rhythm GI: soft, non-tender; bowel sounds normal; no masses,  no organomegaly  Large chronically incarcerated hernia.    Disposition: 01-Home or Self Care  Discharge Orders    Future Orders Please Complete By Expires   Diet - low sodium heart healthy      Increase activity slowly      Discharge instructions      Comments:   Low residual diet       Medication List     As of 06/05/2012 10:39 PM    TAKE these medications         losartan-hydrochlorothiazide 100-25 MG per tablet   Commonly known as: HYZAAR   Take 1 tablet by mouth daily.           Follow-up Information    Follow up with Donato Heinz, MD. Call on 05/28/2012.   Contact information:   [place] [phone]          Signed: Donato Heinz 06/05/2012, 10:39 PM

## 2019-01-13 ENCOUNTER — Inpatient Hospital Stay (HOSPITAL_COMMUNITY)
Admission: EM | Admit: 2019-01-13 | Discharge: 2019-01-30 | DRG: 335 | Disposition: A | Discharge status: Home Health | Payer: Self-pay | Attending: Internal Medicine | Admitting: Internal Medicine

## 2019-01-13 ENCOUNTER — Emergency Department (HOSPITAL_COMMUNITY): Payer: Self-pay

## 2019-01-13 ENCOUNTER — Encounter (HOSPITAL_COMMUNITY): Payer: Self-pay

## 2019-01-13 ENCOUNTER — Other Ambulatory Visit: Payer: Self-pay

## 2019-01-13 DIAGNOSIS — Z6841 Body Mass Index (BMI) 40.0 and over, adult: Secondary | ICD-10-CM

## 2019-01-13 DIAGNOSIS — I10 Essential (primary) hypertension: Secondary | ICD-10-CM | POA: Diagnosis present

## 2019-01-13 DIAGNOSIS — G629 Polyneuropathy, unspecified: Secondary | ICD-10-CM | POA: Diagnosis present

## 2019-01-13 DIAGNOSIS — Z23 Encounter for immunization: Secondary | ICD-10-CM

## 2019-01-13 DIAGNOSIS — F17210 Nicotine dependence, cigarettes, uncomplicated: Secondary | ICD-10-CM | POA: Diagnosis present

## 2019-01-13 DIAGNOSIS — J690 Pneumonitis due to inhalation of food and vomit: Secondary | ICD-10-CM | POA: Diagnosis present

## 2019-01-13 DIAGNOSIS — K432 Incisional hernia without obstruction or gangrene: Secondary | ICD-10-CM

## 2019-01-13 DIAGNOSIS — K449 Diaphragmatic hernia without obstruction or gangrene: Secondary | ICD-10-CM | POA: Diagnosis present

## 2019-01-13 DIAGNOSIS — K56609 Unspecified intestinal obstruction, unspecified as to partial versus complete obstruction: Secondary | ICD-10-CM

## 2019-01-13 DIAGNOSIS — Z880 Allergy status to penicillin: Secondary | ICD-10-CM

## 2019-01-13 DIAGNOSIS — J449 Chronic obstructive pulmonary disease, unspecified: Secondary | ICD-10-CM | POA: Diagnosis present

## 2019-01-13 DIAGNOSIS — D751 Secondary polycythemia: Secondary | ICD-10-CM | POA: Diagnosis present

## 2019-01-13 DIAGNOSIS — I509 Heart failure, unspecified: Secondary | ICD-10-CM

## 2019-01-13 DIAGNOSIS — Z885 Allergy status to narcotic agent status: Secondary | ICD-10-CM

## 2019-01-13 DIAGNOSIS — K430 Incisional hernia with obstruction, without gangrene: Principal | ICD-10-CM | POA: Diagnosis present

## 2019-01-13 DIAGNOSIS — J9621 Acute and chronic respiratory failure with hypoxia: Secondary | ICD-10-CM | POA: Diagnosis not present

## 2019-01-13 DIAGNOSIS — R0902 Hypoxemia: Secondary | ICD-10-CM

## 2019-01-13 DIAGNOSIS — E876 Hypokalemia: Secondary | ICD-10-CM | POA: Diagnosis not present

## 2019-01-13 DIAGNOSIS — J9601 Acute respiratory failure with hypoxia: Secondary | ICD-10-CM | POA: Diagnosis present

## 2019-01-13 DIAGNOSIS — G8929 Other chronic pain: Secondary | ICD-10-CM | POA: Diagnosis present

## 2019-01-13 DIAGNOSIS — J189 Pneumonia, unspecified organism: Secondary | ICD-10-CM

## 2019-01-13 DIAGNOSIS — Z20828 Contact with and (suspected) exposure to other viral communicable diseases: Secondary | ICD-10-CM | POA: Diagnosis present

## 2019-01-13 DIAGNOSIS — M5136 Other intervertebral disc degeneration, lumbar region: Secondary | ICD-10-CM | POA: Diagnosis present

## 2019-01-13 DIAGNOSIS — J441 Chronic obstructive pulmonary disease with (acute) exacerbation: Secondary | ICD-10-CM | POA: Diagnosis not present

## 2019-01-13 HISTORY — DX: Unspecified intestinal obstruction, unspecified as to partial versus complete obstruction: K56.609

## 2019-01-13 HISTORY — DX: Other intervertebral disc degeneration, lumbar region: M51.36

## 2019-01-13 HISTORY — DX: Chronic obstructive pulmonary disease, unspecified: J44.9

## 2019-01-13 HISTORY — DX: Other intervertebral disc degeneration, lumbar region without mention of lumbar back pain or lower extremity pain: M51.369

## 2019-01-13 LAB — CBC WITH DIFFERENTIAL/PLATELET
Abs Immature Granulocytes: 0.02 10*3/uL (ref 0.00–0.07)
Basophils Absolute: 0.1 10*3/uL (ref 0.0–0.1)
Basophils Relative: 1 %
Eosinophils Absolute: 0 10*3/uL (ref 0.0–0.5)
Eosinophils Relative: 0 %
HCT: 56.8 % — ABNORMAL HIGH (ref 36.0–46.0)
Hemoglobin: 18.1 g/dL — ABNORMAL HIGH (ref 12.0–15.0)
Immature Granulocytes: 0 %
Lymphocytes Relative: 6 %
Lymphs Abs: 0.7 10*3/uL (ref 0.7–4.0)
MCH: 32 pg (ref 26.0–34.0)
MCHC: 31.9 g/dL (ref 30.0–36.0)
MCV: 100.4 fL — ABNORMAL HIGH (ref 80.0–100.0)
Monocytes Absolute: 0.3 10*3/uL (ref 0.1–1.0)
Monocytes Relative: 3 %
Neutro Abs: 10.1 10*3/uL — ABNORMAL HIGH (ref 1.7–7.7)
Neutrophils Relative %: 90 %
Platelets: 226 10*3/uL (ref 150–400)
RBC: 5.66 MIL/uL — ABNORMAL HIGH (ref 3.87–5.11)
RDW: 14.3 % (ref 11.5–15.5)
WBC: 11.3 10*3/uL — ABNORMAL HIGH (ref 4.0–10.5)
nRBC: 0 % (ref 0.0–0.2)

## 2019-01-13 LAB — COMPREHENSIVE METABOLIC PANEL
ALT: 16 U/L (ref 0–44)
AST: 18 U/L (ref 15–41)
Albumin: 4.3 g/dL (ref 3.5–5.0)
Alkaline Phosphatase: 83 U/L (ref 38–126)
Anion gap: 10 (ref 5–15)
BUN: 16 mg/dL (ref 6–20)
CO2: 30 mmol/L (ref 22–32)
Calcium: 9.1 mg/dL (ref 8.9–10.3)
Chloride: 98 mmol/L (ref 98–111)
Creatinine, Ser: 0.85 mg/dL (ref 0.44–1.00)
GFR calc Af Amer: >60 mL/min (ref >60)
GFR calc non Af Amer: >60 mL/min (ref >60)
Glucose, Bld: 131 mg/dL — ABNORMAL HIGH (ref 70–99)
Potassium: 4 mmol/L (ref 3.5–5.1)
Sodium: 138 mmol/L (ref 135–145)
Total Bilirubin: 1.1 mg/dL (ref 0.3–1.2)
Total Protein: 8.2 g/dL — ABNORMAL HIGH (ref 6.5–8.1)

## 2019-01-13 LAB — URINALYSIS, ROUTINE W REFLEX MICROSCOPIC
Bilirubin Urine: NEGATIVE
Glucose, UA: NEGATIVE mg/dL
Hgb urine dipstick: NEGATIVE
Ketones, ur: NEGATIVE mg/dL
Leukocytes,Ua: NEGATIVE
Nitrite: NEGATIVE
Protein, ur: 100 mg/dL — AB
Specific Gravity, Urine: 1.017 (ref 1.005–1.030)
pH: 8 (ref 5.0–8.0)

## 2019-01-13 LAB — SARS CORONAVIRUS 2 BY RT PCR (HOSPITAL ORDER, PERFORMED IN ~~LOC~~ HOSPITAL LAB): SARS Coronavirus 2: NEGATIVE

## 2019-01-13 LAB — PROCALCITONIN: Procalcitonin: <0.1 ng/mL

## 2019-01-13 LAB — LIPASE, BLOOD: Lipase: 23 U/L (ref 11–51)

## 2019-01-13 LAB — BRAIN NATRIURETIC PEPTIDE: B Natriuretic Peptide: 36 pg/mL (ref 0.0–100.0)

## 2019-01-13 MED ORDER — SODIUM CHLORIDE 0.45 % IV SOLN
INTRAVENOUS | Status: DC
  Administered 2019-01-13: 21:00:00 via INTRAVENOUS

## 2019-01-13 MED ORDER — FENTANYL CITRATE (PF) 100 MCG/2ML IJ SOLN
25.0000 ug | INTRAMUSCULAR | Status: DC | PRN
  Administered 2019-01-13 – 2019-01-19 (×9): 50 ug via INTRAVENOUS
  Filled 2019-01-13 (×9): qty 2

## 2019-01-13 MED ORDER — ONDANSETRON HCL 4 MG/2ML IJ SOLN
4.0000 mg | Freq: Once | INTRAMUSCULAR | Status: AC
  Administered 2019-01-13: 4 mg via INTRAVENOUS
  Filled 2019-01-13: qty 2

## 2019-01-13 MED ORDER — PNEUMOCOCCAL VAC POLYVALENT 25 MCG/0.5ML IJ INJ
0.5000 mL | INJECTION | INTRAMUSCULAR | Status: AC
  Administered 2019-01-14: 11:00:00 0.5 mL via INTRAMUSCULAR
  Filled 2019-01-13: qty 0.5

## 2019-01-13 MED ORDER — ACETAMINOPHEN 650 MG RE SUPP: 650.0000 mg | Freq: Four times a day (QID) | RECTAL | Status: DC | PRN

## 2019-01-13 MED ORDER — SODIUM CHLORIDE 0.9 % IV SOLN: 250.0000 mL | INTRAVENOUS | Status: DC | PRN

## 2019-01-13 MED ORDER — AEROCHAMBER PLUS FLO-VU MEDIUM MISC
1.0000 | Freq: Once | Status: AC
  Administered 2019-01-13: 17:00:00 1
  Filled 2019-01-13: qty 1

## 2019-01-13 MED ORDER — ALBUTEROL SULFATE HFA 108 (90 BASE) MCG/ACT IN AERS
4.0000 | INHALATION_SPRAY | Freq: Once | RESPIRATORY_TRACT | Status: DC
  Filled 2019-01-13: qty 6.7

## 2019-01-13 MED ORDER — IOHEXOL 300 MG/ML  SOLN
100.0000 mL | Freq: Once | INTRAMUSCULAR | Status: AC | PRN
  Administered 2019-01-13: 18:00:00 100 mL via INTRAVENOUS

## 2019-01-13 MED ORDER — ONDANSETRON HCL 4 MG PO TABS: 4.0000 mg | ORAL_TABLET | Freq: Four times a day (QID) | ORAL | Status: DC | PRN

## 2019-01-13 MED ORDER — ACETAMINOPHEN 325 MG PO TABS: 650.0000 mg | ORAL_TABLET | Freq: Four times a day (QID) | ORAL | Status: DC | PRN

## 2019-01-13 MED ORDER — ONDANSETRON HCL 4 MG/2ML IJ SOLN: 4.0000 mg | Freq: Four times a day (QID) | INTRAMUSCULAR | Status: DC | PRN

## 2019-01-13 MED ORDER — FENTANYL CITRATE (PF) 100 MCG/2ML IJ SOLN
50.0000 ug | Freq: Once | INTRAMUSCULAR | Status: AC
  Administered 2019-01-13: 17:00:00 50 ug via INTRAVENOUS
  Filled 2019-01-13: qty 2

## 2019-01-13 MED ORDER — SODIUM CHLORIDE 0.9% FLUSH
3.0000 mL | Freq: Two times a day (BID) | INTRAVENOUS | Status: DC
  Administered 2019-01-13 – 2019-01-30 (×31): 3 mL via INTRAVENOUS

## 2019-01-13 MED ORDER — IPRATROPIUM-ALBUTEROL 0.5-2.5 (3) MG/3ML IN SOLN
3.0000 mL | Freq: Four times a day (QID) | RESPIRATORY_TRACT | Status: DC | PRN
  Administered 2019-01-15: 3 mL via RESPIRATORY_TRACT

## 2019-01-13 MED ORDER — ORAL CARE MOUTH RINSE
15.0000 mL | Freq: Two times a day (BID) | OROMUCOSAL | Status: DC
  Administered 2019-01-13 – 2019-01-30 (×31): 15 mL via OROMUCOSAL

## 2019-01-13 MED ORDER — INFLUENZA VAC SPLIT QUAD 0.5 ML IM SUSY
0.5000 mL | PREFILLED_SYRINGE | INTRAMUSCULAR | Status: AC
  Administered 2019-01-14: 11:00:00 0.5 mL via INTRAMUSCULAR
  Filled 2019-01-13: qty 0.5

## 2019-01-13 MED ORDER — IPRATROPIUM-ALBUTEROL 20-100 MCG/ACT IN AERS
2.0000 | INHALATION_SPRAY | Freq: Once | RESPIRATORY_TRACT | Status: AC
  Administered 2019-01-13: 2 via RESPIRATORY_TRACT
  Filled 2019-01-13: qty 4

## 2019-01-13 MED ORDER — LABETALOL HCL 5 MG/ML IV SOLN
10.0000 mg | INTRAVENOUS | Status: DC | PRN
  Administered 2019-01-20: 22:00:00 10 mg via INTRAVENOUS
  Filled 2019-01-13: qty 4

## 2019-01-13 MED ORDER — IPRATROPIUM-ALBUTEROL 0.5-2.5 (3) MG/3ML IN SOLN
3.0000 mL | Freq: Once | RESPIRATORY_TRACT | Status: AC
  Administered 2019-01-13: 19:00:00 3 mL via RESPIRATORY_TRACT
  Filled 2019-01-13: qty 3

## 2019-01-13 MED ORDER — IPRATROPIUM BROMIDE HFA 17 MCG/ACT IN AERS
2.0000 | INHALATION_SPRAY | Freq: Once | RESPIRATORY_TRACT | Status: DC
  Filled 2019-01-13: qty 12.9

## 2019-01-13 MED ORDER — SODIUM CHLORIDE 0.9% FLUSH: 3.0000 mL | INTRAVENOUS | Status: DC | PRN

## 2019-01-13 NOTE — ED Notes (Signed)
Pt ambulated to restroom, o2 sats were 80 % upon arrival back to room.

## 2019-01-13 NOTE — ED Notes (Addendum)
Pt sat 87% on RA in triage. Pt placed on 4L of O2. Sats increased to 89%

## 2019-01-13 NOTE — H&P (Signed)
History and Physical    Veronica Horton ACZ:[id number] DOB: [birth year] DOA: 01/13/2019  PCP: [place], [employer]   Patient coming from: Home   Chief Complaint: Abdominal pain, N/V   HPI: Veronica Horton is a 58 y.o. female with medical history significant for COPD, hypertension, hiatal hernia, and history of cholecystectomy, now presenting to the emergency department for evaluation of abdominal pain with nausea and vomiting.  Patient reports that she been in her usual state of health until last night when she developed abdominal pain with nausea and vomiting.  Pain and nausea/vomiting persisted throughout the night and through today, not necessarily worsening, but not improving either.  She also had a marked increase in her chronic cough last night accompanied by thick sputum production.  She does not feel particularly short of breath.  She has not noted any lower extremity swelling or tenderness.  She denies any fevers or chills.  She has not had these symptoms previously.  She denies diarrhea, melena, or hematochezia.  ED Course: Upon arrival to the ED, patient is found to be afebrile, saturating mid 80s on room air, and with remaining vitals stable.  Chemistry panel is notable for a protein of 8.2 and CBC features a leukocytosis to 11,300 and a polycythemia with hemoglobin 18.1.  BNP and lipase were normal and urinalysis with 100 protein.  Chest x-ray features cardiomegaly with diffuse interstitial infiltrates concerning for mild CHF.  CT abdomen and pelvis is notable for small bowel obstruction due to a large periumbilical ventral hernia containing multiple small bowel loops but without signs of strangulation.  Patient was treated with fentanyl, DuoNeb's, Zofran, and NG tube is been placed.  Surgery was consulted by the ED physician and recommends medical admission.  Review of Systems:  All other systems reviewed and apart from HPI, are negative.  Past Medical History:  Diagnosis  Date  . Bronchitis   . COPD (chronic obstructive pulmonary disease) ([place])   . Degenerative disc disease, lumbar   . Hiatal hernia   . Hypertension     Past Surgical History:  Procedure Laterality Date  . CHOLECYSTECTOMY    . degenerative bone disease       reports that she has been smoking. She has been smoking about 1.00 pack per day. She has never used smokeless tobacco. She reports that she does not drink alcohol or use drugs.  Allergies  Allergen Reactions  . Codeine Hives  . Penicillins Hives    Family History  Problem Relation Age of Onset  . Sudden Cardiac Death Neg Hx      Prior to Admission medications   Medication Sig Start Date End Date Taking? Authorizing Provider  gabapentin (NEURONTIN) 300 MG capsule Take 300 mg by mouth 3 (three) times daily. 12/30/18   [provider]  losartan-hydrochlorothiazide (HYZAAR) 100-25 MG per tablet Take 1 tablet by mouth daily.    [provider]    Physical Exam: Vitals:   01/13/19 1830 01/13/19 1915 01/13/19 1928 01/13/19 1930  BP:  (!) 158/87    Pulse: 71   69  Resp:      Temp:      TempSrc:      SpO2: 92%  92% 97%    Constitutional: NAD, calm  Eyes: PERTLA, lids and conjunctivae normal ENMT: Mucous membranes are moist. Posterior pharynx clear of any exudate or lesions.   Neck: normal, supple, no masses, no thyromegaly Respiratory: No tachypnea, speaking full sentences. Coarse rales bilaterally. No pallor  or cyanosis.  Cardiovascular: S1 & S2 heard, regular rate and rhythm. Trace pretibial edema bilaterally.   Abdomen: Generally tender without rebound pain or guarding. Soft. Rare high-pitched bowel sound.  Musculoskeletal: no clubbing / cyanosis. No joint deformity upper and lower extremities.    Skin: hyperpigmentation to bilateral LE's in gaiter distribution. Warm, dry, well-perfused. Neurologic: No facial asymmetry. Sensation intact. Moving all extremities.  Psychiatric: Alert and oriented x 3.  Pleasant, cooperative.    Labs on Admission: I have personally reviewed following labs and imaging studies  CBC: Recent Labs  Lab 01/13/19 1640  WBC 11.3*  NEUTROABS 10.1*  HGB 18.1*  HCT 56.8*  MCV 100.4*  PLT 009   Basic Metabolic Panel: Recent Labs  Lab 01/13/19 1640  NA 138  K 4.0  CL 98  CO2 30  GLUCOSE 131*  BUN 16  CREATININE 0.85  CALCIUM 9.1   GFR: CrCl cannot be calculated (Unknown ideal weight.). Liver Function Tests: Recent Labs  Lab 01/13/19 1640  AST 18  ALT 16  ALKPHOS 83  BILITOT 1.1  PROT 8.2*  ALBUMIN 4.3   Recent Labs  Lab 01/13/19 1640  LIPASE 23   No results for input(s): AMMONIA in the last 168 hours. Coagulation Profile: No results for input(s): INR, PROTIME in the last 168 hours. Cardiac Enzymes: No results for input(s): CKTOTAL, CKMB, CKMBINDEX, TROPONINI in the last 168 hours. BNP (last 3 results) No results for input(s): PROBNP in the last 8760 hours. HbA1C: No results for input(s): HGBA1C in the last 72 hours. CBG: No results for input(s): GLUCAP in the last 168 hours. Lipid Profile: No results for input(s): CHOL, HDL, LDLCALC, TRIG, CHOLHDL, LDLDIRECT in the last 72 hours. Thyroid Function Tests: No results for input(s): TSH, T4TOTAL, FREET4, T3FREE, THYROIDAB in the last 72 hours. Anemia Panel: No results for input(s): VITAMINB12, FOLATE, FERRITIN, TIBC, IRON, RETICCTPCT in the last 72 hours. Urine analysis:    Component Value Date/Time   COLORURINE YELLOW 01/13/2019 1730   APPEARANCEUR CLOUDY (A) 01/13/2019 1730   LABSPEC 1.017 01/13/2019 1730   PHURINE 8.0 01/13/2019 1730   GLUCOSEU NEGATIVE 01/13/2019 1730   HGBUR NEGATIVE 01/13/2019 1730   BILIRUBINUR NEGATIVE 01/13/2019 1730   KETONESUR NEGATIVE 01/13/2019 1730   PROTEINUR 100 (A) 01/13/2019 1730   UROBILINOGEN 0.2 09/25/2010 0530   NITRITE NEGATIVE 01/13/2019 1730   LEUKOCYTESUR NEGATIVE 01/13/2019 1730   Sepsis  Labs: @LABRCNTIP (procalcitonin:4,lacticidven:4) ) Recent Results (from the past 240 hour(s))  SARS Coronavirus 2 [facility] order, Performed in [facility] hospital lab) Nasopharyngeal Nasopharyngeal Swab     Status: None   Collection Time: 01/13/19  3:59 PM   Specimen: Nasopharyngeal Swab  Result Value Ref Range Status   SARS Coronavirus 2 NEGATIVE NEGATIVE Final    Comment: (NOTE) If result is NEGATIVE SARS-CoV-2 target nucleic acids are NOT DETECTED. The SARS-CoV-2 RNA is generally detectable in upper and lower  respiratory specimens during the acute phase of infection. The lowest  concentration of SARS-CoV-2 viral copies this assay can detect is 250  copies / mL. A negative result does not preclude SARS-CoV-2 infection  and should not be used as the sole basis for treatment or other  patient management decisions.  A negative result may occur with  improper specimen collection / handling, submission of specimen other  than nasopharyngeal swab, presence of viral mutation(s) within the  areas targeted by this assay, and inadequate number of viral copies  (<250 copies / mL). A negative result must be  combined with clinical  observations, patient history, and epidemiological information. If result is POSITIVE SARS-CoV-2 target nucleic acids are DETECTED. The SARS-CoV-2 RNA is generally detectable in upper and lower  respiratory specimens dur ing the acute phase of infection.  Positive  results are indicative of active infection with SARS-CoV-2.  Clinical  correlation with patient history and other diagnostic information is  necessary to determine patient infection status.  Positive results do  not rule out bacterial infection or co-infection with other viruses. If result is PRESUMPTIVE POSTIVE SARS-CoV-2 nucleic acids MAY BE PRESENT.   A presumptive positive result was obtained on the submitted specimen  and confirmed on repeat testing.  While 2019 novel coronavirus  (SARS-CoV-2)  nucleic acids may be present in the submitted sample  additional confirmatory testing may be necessary for epidemiological  and / or clinical management purposes  to differentiate between  SARS-CoV-2 and other Sarbecovirus currently known to infect humans.  If clinically indicated additional testing with an alternate test  methodology [phone]) is advised. The SARS-CoV-2 RNA is generally  detectable in upper and lower respiratory sp ecimens during the acute  phase of infection. The expected result is Negative. Fact Sheet for Patients:  StrictlyIdeas.no Fact Sheet for Healthcare Providers: BankingDealers.co.za This test is not yet approved or cleared by the [place] FDA and has been authorized for detection and/or diagnosis of SARS-CoV-2 by FDA under an Emergency Use Authorization (EUA).  This EUA will remain in effect (meaning this test can be used) for the duration of the COVID-19 declaration under Section 564(b)(1) of the Act, 21 U.S.C. section 360bbb-3(b)(1), unless the authorization is terminated or revoked sooner. Performed at [facility], [address] [place], [place] [address]      Radiological Exams on Admission: Ct Abdomen Pelvis W Contrast  Result Date: 01/13/2019 CLINICAL DATA:  Right-sided abdominal pain and nausea and vomiting beginning last night. Prior cholecystectomy. EXAM: CT ABDOMEN AND PELVIS WITH CONTRAST TECHNIQUE: Multidetector CT imaging of the abdomen and pelvis was performed using the standard protocol following bolus administration of intravenous contrast. CONTRAST:  15mL OMNIPAQUE IOHEXOL 300 MG/ML  SOLN COMPARISON:  05/14/2012 FINDINGS: Lower Chest: Increased linear opacity in both lung bases since 2014, which may be due to subsegmental atelectasis or scarring. Hepatobiliary: No hepatic masses identified. Prior cholecystectomy. No evidence of biliary obstruction. Pancreas:  No mass or inflammatory changes.  Spleen: Within normal limits in size and appearance. Adrenals/Urinary Tract: No masses identified. Several small left renal cysts again noted. No evidence of hydronephrosis. Stomach/Bowel: Large paraumbilical ventral hernia is again seen containing multiple small bowel loops. Moderate dilatation of small bowel is seen within the hernia and proximal to the hernia, consistent with small-bowel obstruction. No evidence of small bowel wall thickening or pneumatosis. No evidence of inflammatory process or abnormal fluid collections. Diverticulosis is seen mainly involving the descending and sigmoid colon, however there is no evidence of diverticulitis. Vascular/Lymphatic: No pathologically enlarged lymph nodes. No abdominal aortic aneurysm. Aortic atherosclerosis. Reproductive:  No mass or other significant abnormality. Other:  None. Musculoskeletal:  No suspicious bone lesions identified. IMPRESSION: 1. Small bowel obstruction due to large paraumbilical ventral hernia, which contains multiple small bowel loops. No signs of bowel strangulation. 2. Colonic diverticulosis, without radiographic evidence of diverticulitis. Aortic Atherosclerosis (ICD10-I70.0). Electronically Signed   By: Marlaine Hind M.D.   On: 01/13/2019 18:50   Dg Chest Portable 1 View  Result Date: 01/13/2019 CLINICAL DATA:  Cough. Nausea and vomiting. Right-sided abdominal pain. EXAM: PORTABLE CHEST  1 VIEW COMPARISON:  09/25/2010 FINDINGS: Mild-to-moderate cardiomegaly. Diffuse interstitial infiltrates, suspicious for interstitial edema. Stable linear opacity in right lower lung, consistent with scarring. No evidence of pulmonary consolidation or pleural effusion. IMPRESSION: Cardiomegaly and diffuse interstitial infiltrates, suspicious for mild congestive heart failure. Electronically Signed   By: Marlaine Hind M.D.   On: 01/13/2019 16:41    EKG: Not performed.   Assessment/Plan   1. SBO  - Presents with 1 day of abd pain and N/V, found to  have SBO on CT d/t ventral hernia   - NGT was placed in ED  - Surgery is consulting and much appreciated  - Continue NG decompression, pain-control, monitor fluid-status and electrolytes, serial exams    2. Acute hypoxic respiratory failure  - Presents with abdominal pain and N/V, also reports productive cough that developed shortly after the GI sxs and is found to be saturating in mid-80's on rm air in ED  - She is afebrile, COVID-19 is negative, BNP normal, appears roughly euvolemic, and CXR findings suggest possible CHF  - She could have new CHF, though she developed productive cough shortly after onset of N/V and suspect she may have aspirated   - Plan for gentle IVF hydration, follow daily wt and I/O's, check sputum culture and trend procalcitonin, repeat labs in am and consider repeat CXR, continue supplemental O2 and nebs as needed   3. COPD, tobacco abuse   - She was treated with duoneb in ED and is not wheezing at time of admission  - She smokes a ppd, has not thought much about quitting lately but open to trying to cut back, declines nicotine patch  - Continue duonebs prn    4. Hypertension  - BP slightly elevated in ED in setting of pain and nausea  - Control pain and nausea, use labetalol IVP's as needed while NPO   5. Polycythemia  - Hgb is 18.1 on admission  - She is a smoker and may have chronic hypoxia, also likely hemoconcentrated in setting of N/V, will trend for now     PPE: Mask, face shield  DVT prophylaxis: SCD's  Code Status: Full  Family Communication: Discussed with patient  Consults called: Surgery  Admission status: Observation     Vianne Bulls, MD [facility] Pager [phone]  If 7PM-7AM, please contact night-coverage [email or web address] Password [facility]  01/13/2019, 8:03 PM

## 2019-01-13 NOTE — ED Notes (Signed)
ED TO INPATIENT HANDOFF REPORT  ED Nurse Name and Phone #: [phone]  S Name/Age/Gender Veronica Horton 58 y.o. female Room/Bed: APA03/APA03  Code Status   Code Status: Not on file  Home/SNF/Other Home Patient oriented to: situation Is this baseline? Yes   Triage Complete: Triage complete  Chief Complaint Abdominal Pain; Emesis  Triage Note Pt presents to ED with complaints of mid and right side abdominal pain, nausea and vomiting which started last night.    Allergies Allergies  Allergen Reactions  . Codeine Hives  . Penicillins Hives    Level of Care/Admitting Diagnosis ED Disposition    None      B Medical/Surgery History Past Medical History:  Diagnosis Date  . Bronchitis   . COPD (chronic obstructive pulmonary disease) ([place])   . Degenerative disc disease, lumbar   . Hiatal hernia   . Hypertension    Past Surgical History:  Procedure Laterality Date  . CHOLECYSTECTOMY    . degenerative bone disease       A IV Location/Drains/Wounds Patient Lines/Drains/Airways Status   Active Line/Drains/Airways    Name:   Placement date:   Placement time:   Site:   Days:   Peripheral IV 01/13/19 Right Antecubital   01/13/19    1635    Antecubital   less than 1          Intake/Output Last 24 hours No intake or output data in the 24 hours ending 01/13/19 1905  Labs/Imaging Results for orders placed or performed during the hospital encounter of 01/13/19 (from the past 48 hour(s))  SARS Coronavirus 2 [facility] order, Performed in [facility] hospital lab) Nasopharyngeal Nasopharyngeal Swab     Status: None   Collection Time: 01/13/19  3:59 PM   Specimen: Nasopharyngeal Swab  Result Value Ref Range   SARS Coronavirus 2 NEGATIVE NEGATIVE    Comment: (NOTE) If result is NEGATIVE SARS-CoV-2 target nucleic acids are NOT DETECTED. The SARS-CoV-2 RNA is generally detectable in upper and lower  respiratory specimens during the acute phase of infection. The  lowest  concentration of SARS-CoV-2 viral copies this assay can detect is 250  copies / mL. A negative result does not preclude SARS-CoV-2 infection  and should not be used as the sole basis for treatment or other  patient management decisions.  A negative result may occur with  improper specimen collection / handling, submission of specimen other  than nasopharyngeal swab, presence of viral mutation(s) within the  areas targeted by this assay, and inadequate number of viral copies  (<250 copies / mL). A negative result must be combined with clinical  observations, patient history, and epidemiological information. If result is POSITIVE SARS-CoV-2 target nucleic acids are DETECTED. The SARS-CoV-2 RNA is generally detectable in upper and lower  respiratory specimens dur ing the acute phase of infection.  Positive  results are indicative of active infection with SARS-CoV-2.  Clinical  correlation with patient history and other diagnostic information is  necessary to determine patient infection status.  Positive results do  not rule out bacterial infection or co-infection with other viruses. If result is PRESUMPTIVE POSTIVE SARS-CoV-2 nucleic acids MAY BE PRESENT.   A presumptive positive result was obtained on the submitted specimen  and confirmed on repeat testing.  While 2019 novel coronavirus  (SARS-CoV-2) nucleic acids may be present in the submitted sample  additional confirmatory testing may be necessary for epidemiological  and / or clinical management purposes  to differentiate between  SARS-CoV-2 and  other Sarbecovirus currently known to infect humans.  If clinically indicated additional testing with an alternate test  methodology [phone]) is advised. The SARS-CoV-2 RNA is generally  detectable in upper and lower respiratory sp ecimens during the acute  phase of infection. The expected result is Negative. Fact Sheet for Patients:   StrictlyIdeas.no Fact Sheet for Healthcare Providers: BankingDealers.co.za This test is not yet approved or cleared by the [place] FDA and has been authorized for detection and/or diagnosis of SARS-CoV-2 by FDA under an Emergency Use Authorization (EUA).  This EUA will remain in effect (meaning this test can be used) for the duration of the COVID-19 declaration under Section 564(b)(1) of the Act, 21 U.S.C. section 360bbb-3(b)(1), unless the authorization is terminated or revoked sooner. Performed at [facility], [address], [place], [place] [address]   Comprehensive metabolic panel     Status: Abnormal   Collection Time: 01/13/19  4:40 PM  Result Value Ref Range   Sodium 138 135 - 145 mmol/L   Potassium 4.0 3.5 - 5.1 mmol/L   Chloride 98 98 - 111 mmol/L   CO2 30 22 - 32 mmol/L   Glucose, Bld 131 (H) 70 - 99 mg/dL   BUN 16 6 - 20 mg/dL   Creatinine, Ser 0.85 0.44 - 1.00 mg/dL   Calcium 9.1 8.9 - 10.3 mg/dL   Total Protein 8.2 (H) 6.5 - 8.1 g/dL   Albumin 4.3 3.5 - 5.0 g/dL   AST 18 15 - 41 U/L   ALT 16 0 - 44 U/L   Alkaline Phosphatase 83 38 - 126 U/L   Total Bilirubin 1.1 0.3 - 1.2 mg/dL   GFR calc non Af Amer >60 >60 mL/min   GFR calc Af Amer >60 >60 mL/min   Anion gap 10 5 - 15    Comment: Performed at [facility], [address] [place], [place] [address]  CBC with Differential     Status: Abnormal   Collection Time: 01/13/19  4:40 PM  Result Value Ref Range   WBC 11.3 (H) 4.0 - 10.5 K/uL   RBC 5.66 (H) 3.87 - 5.11 MIL/uL   Hemoglobin 18.1 (H) 12.0 - 15.0 g/dL   HCT 56.8 (H) 36.0 - 46.0 %   MCV 100.4 (H) 80.0 - 100.0 fL   MCH 32.0 26.0 - 34.0 pg   MCHC 31.9 30.0 - 36.0 g/dL   RDW 14.3 11.5 - 15.5 %   Platelets 226 150 - 400 K/uL   nRBC 0.0 0.0 - 0.2 %   Neutrophils Relative % 90 %   Neutro Abs 10.1 (H) 1.7 - 7.7 K/uL   Lymphocytes Relative 6 %   Lymphs Abs 0.7 0.7 - 4.0 K/uL   Monocytes Relative 3 %    Monocytes Absolute 0.3 0.1 - 1.0 K/uL   Eosinophils Relative 0 %   Eosinophils Absolute 0.0 0.0 - 0.5 K/uL   Basophils Relative 1 %   Basophils Absolute 0.1 0.0 - 0.1 K/uL   Immature Granulocytes 0 %   Abs Immature Granulocytes 0.02 0.00 - 0.07 K/uL    Comment: Performed at [facility], [address], [place], [place] [address]  Lipase, blood     Status: None   Collection Time: 01/13/19  4:40 PM  Result Value Ref Range   Lipase 23 11 - 51 U/L    Comment: Performed at [facility], [address] [place], [place] [address]  Brain natriuretic peptide     Status: None  Collection Time: 01/13/19  4:40 PM  Result Value Ref Range   B Natriuretic Peptide 36.0 0.0 - 100.0 pg/mL    Comment: Performed at [facility], [address] [place], [place] [address]  Urinalysis, Routine w reflex microscopic     Status: Abnormal   Collection Time: 01/13/19  5:30 PM  Result Value Ref Range   Color, Urine YELLOW YELLOW   APPearance CLOUDY (A) CLEAR   Specific Gravity, Urine 1.017 1.005 - 1.030   pH 8.0 5.0 - 8.0   Glucose, UA NEGATIVE NEGATIVE mg/dL   Hgb urine dipstick NEGATIVE NEGATIVE   Bilirubin Urine NEGATIVE NEGATIVE   Ketones, ur NEGATIVE NEGATIVE mg/dL   Protein, ur 100 (A) NEGATIVE mg/dL   Nitrite NEGATIVE NEGATIVE   Leukocytes,Ua NEGATIVE NEGATIVE   RBC / HPF 0-5 0 - 5 RBC/hpf   WBC, UA 6-10 0 - 5 WBC/hpf   Bacteria, UA RARE (A) NONE SEEN   Squamous Epithelial / LPF 0-5 0 - 5   Amorphous Crystal PRESENT     Comment: Performed at [facility], [address], [place], [place] [address]   Ct Abdomen Pelvis W Contrast  Result Date: 01/13/2019 CLINICAL DATA:  Right-sided abdominal pain and nausea and vomiting beginning last night. Prior cholecystectomy. EXAM: CT ABDOMEN AND PELVIS WITH CONTRAST TECHNIQUE: Multidetector CT imaging of the abdomen and pelvis was performed using the standard protocol following bolus administration of intravenous contrast. CONTRAST:  144mL OMNIPAQUE IOHEXOL  300 MG/ML  SOLN COMPARISON:  05/14/2012 FINDINGS: Lower Chest: Increased linear opacity in both lung bases since 2014, which may be due to subsegmental atelectasis or scarring. Hepatobiliary: No hepatic masses identified. Prior cholecystectomy. No evidence of biliary obstruction. Pancreas:  No mass or inflammatory changes. Spleen: Within normal limits in size and appearance. Adrenals/Urinary Tract: No masses identified. Several small left renal cysts again noted. No evidence of hydronephrosis. Stomach/Bowel: Large paraumbilical ventral hernia is again seen containing multiple small bowel loops. Moderate dilatation of small bowel is seen within the hernia and proximal to the hernia, consistent with small-bowel obstruction. No evidence of small bowel wall thickening or pneumatosis. No evidence of inflammatory process or abnormal fluid collections. Diverticulosis is seen mainly involving the descending and sigmoid colon, however there is no evidence of diverticulitis. Vascular/Lymphatic: No pathologically enlarged lymph nodes. No abdominal aortic aneurysm. Aortic atherosclerosis. Reproductive:  No mass or other significant abnormality. Other:  None. Musculoskeletal:  No suspicious bone lesions identified. IMPRESSION: 1. Small bowel obstruction due to large paraumbilical ventral hernia, which contains multiple small bowel loops. No signs of bowel strangulation. 2. Colonic diverticulosis, without radiographic evidence of diverticulitis. Aortic Atherosclerosis (ICD10-I70.0). Electronically Signed   By: Marlaine Hind M.D.   On: 01/13/2019 18:50   Dg Chest Portable 1 View  Result Date: 01/13/2019 CLINICAL DATA:  Cough. Nausea and vomiting. Right-sided abdominal pain. EXAM: PORTABLE CHEST 1 VIEW COMPARISON:  09/25/2010 FINDINGS: Mild-to-moderate cardiomegaly. Diffuse interstitial infiltrates, suspicious for interstitial edema. Stable linear opacity in right lower lung, consistent with scarring. No evidence of pulmonary  consolidation or pleural effusion. IMPRESSION: Cardiomegaly and diffuse interstitial infiltrates, suspicious for mild congestive heart failure. Electronically Signed   By: Marlaine Hind M.D.   On: 01/13/2019 16:41    Pending Labs Unresulted Labs (From admission, onward)   None      Vitals/Pain Today's Vitals   01/13/19 1630 01/13/19 1700 01/13/19 1830 01/13/19 1858  BP: (!) 161/78     Pulse:  62 71   Resp:  Temp:      TempSrc:      SpO2:  93% 92%   PainSc:   2  Asleep    Isolation Precautions No active isolations  Medications Medications  ipratropium-albuterol (DUONEB) 0.5-2.5 (3) MG/3ML nebulizer solution 3 mL (has no administration in time range)  AeroChamber Plus Flo-Vu Medium MISC 1 each (1 each Other Given 01/13/19 1635)  fentaNYL (SUBLIMAZE) injection 50 mcg (50 mcg Intravenous Given 01/13/19 1635)  ondansetron (ZOFRAN) injection 4 mg (4 mg Intravenous Given 01/13/19 1635)  Ipratropium-Albuterol (COMBIVENT) respimat 2 puff (2 puffs Inhalation Given 01/13/19 1724)  iohexol (OMNIPAQUE) 300 MG/ML solution 100 mL (100 mLs Intravenous Contrast Given 01/13/19 1805)    Mobility walks Low fall risk   Focused Assessments    R Recommendations: See Admitting Provider Note  Report given to:   Additional Notes:

## 2019-01-13 NOTE — ED Provider Notes (Signed)
[facility] EMERGENCY DEPARTMENT Provider Note   CSN: [id number] Arrival date & time: 01/13/19  1420     History   Chief Complaint Chief Complaint  Patient presents with  . Abdominal Pain    HPI Veronica Horton is a 58 y.o. female with a hx of COPD, HTN, hiatal hernia, & prior cholecystectomy who presents to the ED w/ complaints of abdominal pain that began last night. Patient states pain is located to the R side of the abdomen, it is constant, moderate in severity, no alleviating/aggravating factors. Reports associated nausea w/ 5 episodes of emesis, last BM was 2 nights prior- typically goes daily. Denies fever, chills, hematemesis, diarrhea, melena, or hematochezia. She has chronic cough & wheezing which she states are unchanged. Denies fever, chills, chest pain, or dyspnea. Last PO intake was yesterday.     HPI  Past Medical History:  Diagnosis Date  . Bronchitis   . COPD (chronic obstructive pulmonary disease) ([place])   . Degenerative disc disease, lumbar   . Hiatal hernia   . Hypertension     There are no active problems to display for this patient.   Past Surgical History:  Procedure Laterality Date  . CHOLECYSTECTOMY    . degenerative bone disease       OB History   No obstetric history on file.      Home Medications    Prior to Admission medications   Medication Sig Start Date End Date Taking? Authorizing Provider  gabapentin (NEURONTIN) 300 MG capsule Take 300 mg by mouth 3 (three) times daily. 12/30/18   [provider]  losartan-hydrochlorothiazide (HYZAAR) 100-25 MG per tablet Take 1 tablet by mouth daily.    [provider]    Family History No family history on file.  Social History Social History   Tobacco Use  . Smoking status: Current Every Day Smoker  Substance Use Topics  . Alcohol use: No  . Drug use: No     Allergies   Codeine and Penicillins   Review of Systems Review of Systems  Constitutional: Negative  for chills and fever.  HENT: Negative for congestion, ear pain and sore throat.   Respiratory: Positive for cough (chronic unchanged) and wheezing (chronic unchanged). Negative for shortness of breath.   Cardiovascular: Negative for chest pain.  Gastrointestinal: Positive for abdominal pain, constipation, nausea and vomiting. Negative for anal bleeding, blood in stool and diarrhea.  Genitourinary: Negative for dysuria, vaginal bleeding and vaginal discharge.  All other systems reviewed and are negative.    Physical Exam Updated Vital Signs BP (!) 161/78   Pulse 71   Temp 98.2 F (36.8 C) (Oral)   Resp 19   SpO2 92%    Physical Exam Vitals signs and nursing note reviewed.  Constitutional:      General: She is not in acute distress.    Appearance: She is well-developed. She is obese. She is not toxic-appearing.  HENT:     Head: Normocephalic and atraumatic.  Eyes:     General:        Right eye: No discharge.        Left eye: No discharge.     Conjunctiva/sclera: Conjunctivae normal.  Neck:     Musculoskeletal: Neck supple.  Cardiovascular:     Rate and Rhythm: Normal rate and regular rhythm.  Pulmonary:     Effort: Pulmonary effort is normal. No respiratory distress.     Breath sounds: Wheezing (expiratory throughout) present. No rhonchi or rales.  Comments: SpO2 92-96% on RA, coughing in exam room w/ brief desat to 89%.  Abdominal:     General: There is no distension.     Palpations: Abdomen is soft.     Tenderness: There is abdominal tenderness in the right upper quadrant, right lower quadrant and periumbilical area. There is no right CVA tenderness or left CVA tenderness.     Hernia: A hernia (very large in size, tender to palpation, unable to fully reduce) is present. Hernia is present in the ventral area.     Comments: Tympany to percussion.   Skin:    General: Skin is warm and dry.     Findings: No rash.  Neurological:     Mental Status: She is alert.      Comments: Clear speech.   Psychiatric:        Behavior: Behavior normal.    ED Treatments / Results  Labs (all labs ordered are listed, but only abnormal results are displayed) Labs Reviewed  URINALYSIS, ROUTINE W REFLEX MICROSCOPIC - Abnormal; Notable for the following components:      Result Value   APPearance CLOUDY (*)    Protein, ur 100 (*)    Bacteria, UA RARE (*)    All other components within normal limits  COMPREHENSIVE METABOLIC PANEL - Abnormal; Notable for the following components:   Glucose, Bld 131 (*)    Total Protein 8.2 (*)    All other components within normal limits  CBC WITH DIFFERENTIAL/PLATELET - Abnormal; Notable for the following components:   WBC 11.3 (*)    RBC 5.66 (*)    Hemoglobin 18.1 (*)    HCT 56.8 (*)    MCV 100.4 (*)    Neutro Abs 10.1 (*)    All other components within normal limits  SARS CORONAVIRUS 2 (HOSPITAL ORDER, [place] [facility])  EXPECTORATED SPUTUM ASSESSMENT W REFEX TO RESP CULTURE  LIPASE, BLOOD  BRAIN NATRIURETIC PEPTIDE  PROCALCITONIN  PROCALCITONIN    EKG None  Radiology Ct Abdomen Pelvis W Contrast  Result Date: 01/13/2019 CLINICAL DATA:  Right-sided abdominal pain and nausea and vomiting beginning last night. Prior cholecystectomy. EXAM: CT ABDOMEN AND PELVIS WITH CONTRAST TECHNIQUE: Multidetector CT imaging of the abdomen and pelvis was performed using the standard protocol following bolus administration of intravenous contrast. CONTRAST:  182mL OMNIPAQUE IOHEXOL 300 MG/ML  SOLN COMPARISON:  05/14/2012 FINDINGS: Lower Chest: Increased linear opacity in both lung bases since 2014, which may be due to subsegmental atelectasis or scarring. Hepatobiliary: No hepatic masses identified. Prior cholecystectomy. No evidence of biliary obstruction. Pancreas:  No mass or inflammatory changes. Spleen: Within normal limits in size and appearance. Adrenals/Urinary Tract: No masses identified. Several small left renal  cysts again noted. No evidence of hydronephrosis. Stomach/Bowel: Large paraumbilical ventral hernia is again seen containing multiple small bowel loops. Moderate dilatation of small bowel is seen within the hernia and proximal to the hernia, consistent with small-bowel obstruction. No evidence of small bowel wall thickening or pneumatosis. No evidence of inflammatory process or abnormal fluid collections. Diverticulosis is seen mainly involving the descending and sigmoid colon, however there is no evidence of diverticulitis. Vascular/Lymphatic: No pathologically enlarged lymph nodes. No abdominal aortic aneurysm. Aortic atherosclerosis. Reproductive:  No mass or other significant abnormality. Other:  None. Musculoskeletal:  No suspicious bone lesions identified. IMPRESSION: 1. Small bowel obstruction due to large paraumbilical ventral hernia, which contains multiple small bowel loops. No signs of bowel strangulation. 2. Colonic diverticulosis, without radiographic  evidence of diverticulitis. Aortic Atherosclerosis (ICD10-I70.0). Electronically Signed   By: Marlaine Hind M.D.   On: 01/13/2019 18:50   Dg Chest Portable 1 View  Result Date: 01/13/2019 CLINICAL DATA:  Cough. Nausea and vomiting. Right-sided abdominal pain. EXAM: PORTABLE CHEST 1 VIEW COMPARISON:  09/25/2010 FINDINGS: Mild-to-moderate cardiomegaly. Diffuse interstitial infiltrates, suspicious for interstitial edema. Stable linear opacity in right lower lung, consistent with scarring. No evidence of pulmonary consolidation or pleural effusion. IMPRESSION: Cardiomegaly and diffuse interstitial infiltrates, suspicious for mild congestive heart failure. Electronically Signed   By: Marlaine Hind M.D.   On: 01/13/2019 16:41    Procedures Procedures (including critical care time)  Medications Ordered in ED Medications - No data to display   Initial Impression / Assessment and Plan / ED Course  I have reviewed the triage vital signs and the nursing  notes.  Pertinent labs & imaging results that were available during my care of the patient were reviewed by me and considered in my medical decision making (see chart for details).   Patient presents to the emergency department with complaints of abdominal pain that began last night associated with nausea, vomiting, and constipation.  Patient is nontoxic-appearing, seems to be resting fairly comfortably, vitals notable for mild hypoxia, oxygen 89-96% @ rest while I was in the exam room, did desat to low 80s w/ ambulation per nursing staff- placed on 2L via [place]. Lungs with expiratory wheezing throughout- start w/ albuterol/atrovent inhaler, rapid covid to trial duoneb once resulted. Abdomen exam notable for very large ventral hernia which is tender to palpation as well as R sided abdominal tenderness- DDX: bowel obstruction/perf w/ or w/o hernia involvement, appendicitis, diverticulitis, mass. Analgesics & anti-emetics ordered.   CBC Mild leukocytosis at 11.3, hemoglobin/hematocrit mildly elevated. CMP: Mild hyperglycemia at 131.  Otherwise fairly unremarkable. Lipase: WNL UA: Not overly consistent with UTI.  Chest x-ray:Cardiomegaly and diffuse interstitial infiltrates, suspicious for mild congestive heart failure. --> no prior hx of HF, no significant peripheral edema on exam, BNP added on WNL.  COVID: Negative ---> Duoneb ordered.   CT A/P: 1. Small bowel obstruction due to large paraumbilical ventral hernia, which contains multiple small bowel loops. No signs of bowel strangulation. 2. Colonic diverticulosis, without radiographic evidence of diverticulitis. Aortic Atherosclerosis    Will place NG tube & discuss w/ general surgery.   19:16: CONSULT: Discussed w/ general surgeon Dr. Arnoldo Morale- in agreement w/ NG tube insertion, hospitalist admission, will see in the AM.   19:25: CONSULT: Discussed w/ hospitalist Dr. Myna Hidalgo- accepts admission.   Final Clinical Impressions(s) / ED Diagnoses    Final diagnoses:  Small bowel obstruction [facility])    ED Discharge Orders    None       Amaryllis Dyke, PA-C 01/13/19 2010    Fredia Sorrow, MD 01/20/19 [phone]

## 2019-01-13 NOTE — ED Triage Notes (Signed)
Pt presents to ED with complaints of mid and right side abdominal pain, nausea and vomiting which started last night.

## 2019-01-14 DIAGNOSIS — J441 Chronic obstructive pulmonary disease with (acute) exacerbation: Secondary | ICD-10-CM

## 2019-01-14 DIAGNOSIS — K56609 Unspecified intestinal obstruction, unspecified as to partial versus complete obstruction: Secondary | ICD-10-CM

## 2019-01-14 LAB — CBC WITH DIFFERENTIAL/PLATELET
Abs Immature Granulocytes: 0.05 10*3/uL (ref 0.00–0.07)
Basophils Absolute: 0.1 10*3/uL (ref 0.0–0.1)
Basophils Relative: 1 %
Eosinophils Absolute: 0.1 10*3/uL (ref 0.0–0.5)
Eosinophils Relative: 1 %
HCT: 54 % — ABNORMAL HIGH (ref 36.0–46.0)
Hemoglobin: 16.9 g/dL — ABNORMAL HIGH (ref 12.0–15.0)
Immature Granulocytes: 1 %
Lymphocytes Relative: 19 %
Lymphs Abs: 2 10*3/uL (ref 0.7–4.0)
MCH: 32.3 pg (ref 26.0–34.0)
MCHC: 31.3 g/dL (ref 30.0–36.0)
MCV: 103.1 fL — ABNORMAL HIGH (ref 80.0–100.0)
Monocytes Absolute: 0.9 10*3/uL (ref 0.1–1.0)
Monocytes Relative: 9 %
Neutro Abs: 7.3 10*3/uL (ref 1.7–7.7)
Neutrophils Relative %: 69 %
Platelets: 233 10*3/uL (ref 150–400)
RBC: 5.24 MIL/uL — ABNORMAL HIGH (ref 3.87–5.11)
RDW: 14.6 % (ref 11.5–15.5)
WBC: 10.3 10*3/uL (ref 4.0–10.5)
nRBC: 0 % (ref 0.0–0.2)

## 2019-01-14 LAB — BASIC METABOLIC PANEL
Anion gap: 9 (ref 5–15)
BUN: 18 mg/dL (ref 6–20)
CO2: 30 mmol/L (ref 22–32)
Calcium: 9 mg/dL (ref 8.9–10.3)
Chloride: 104 mmol/L (ref 98–111)
Creatinine, Ser: 0.97 mg/dL (ref 0.44–1.00)
GFR calc Af Amer: >60 mL/min (ref >60)
GFR calc non Af Amer: >60 mL/min (ref >60)
Glucose, Bld: 95 mg/dL (ref 70–99)
Potassium: 4.1 mmol/L (ref 3.5–5.1)
Sodium: 143 mmol/L (ref 135–145)

## 2019-01-14 LAB — SURGICAL PCR SCREEN
MRSA, PCR: NEGATIVE
Staphylococcus aureus: NEGATIVE

## 2019-01-14 LAB — PROCALCITONIN: Procalcitonin: <0.1 ng/mL

## 2019-01-14 MED ORDER — KCL IN DEXTROSE-NACL 20-5-0.45 MEQ/L-%-% IV SOLN
INTRAVENOUS | Status: DC
  Administered 2019-01-14 – 2019-01-15 (×3): via INTRAVENOUS

## 2019-01-14 MED ORDER — CHLORHEXIDINE GLUCONATE CLOTH 2 % EX PADS
6.0000 | MEDICATED_PAD | Freq: Once | CUTANEOUS | Status: AC
  Administered 2019-01-15: 06:00:00 6 via TOPICAL

## 2019-01-14 MED ORDER — BUDESONIDE 0.5 MG/2ML IN SUSP
0.5000 mg | Freq: Two times a day (BID) | RESPIRATORY_TRACT | Status: DC
  Administered 2019-01-14 – 2019-01-30 (×32): 0.5 mg via RESPIRATORY_TRACT
  Filled 2019-01-14 (×32): qty 2

## 2019-01-14 MED ORDER — ENOXAPARIN SODIUM 40 MG/0.4ML ~~LOC~~ SOLN: 40.0000 mg | Freq: Once | SUBCUTANEOUS | Status: DC

## 2019-01-14 MED ORDER — IPRATROPIUM-ALBUTEROL 0.5-2.5 (3) MG/3ML IN SOLN
3.0000 mL | Freq: Four times a day (QID) | RESPIRATORY_TRACT | Status: DC
  Administered 2019-01-14 – 2019-01-16 (×7): 3 mL via RESPIRATORY_TRACT
  Filled 2019-01-14 (×7): qty 3

## 2019-01-14 MED ORDER — METHYLPREDNISOLONE SODIUM SUCC 125 MG IJ SOLR
60.0000 mg | Freq: Three times a day (TID) | INTRAMUSCULAR | Status: DC
  Administered 2019-01-14 – 2019-01-15 (×4): 60 mg via INTRAVENOUS
  Filled 2019-01-14 (×4): qty 2

## 2019-01-14 MED ORDER — CHLORHEXIDINE GLUCONATE CLOTH 2 % EX PADS: 6.0000 | MEDICATED_PAD | Freq: Once | CUTANEOUS | Status: DC

## 2019-01-14 MED ORDER — VANCOMYCIN HCL 10 G IV SOLR: 1500.0000 mg | INTRAVENOUS | Status: AC

## 2019-01-14 NOTE — Progress Notes (Signed)
PROGRESS NOTE  Veronica Horton WJX:[id number] DOB: 08-11-1960 DOA: 01/13/2019 PCP: [facility]  Brief History:  57 y/o female with history of COPD, HTN, tobacco abuse, morbid obesity presenting with abdominal pain with nausea and vomiting that started evening 01/12/19 and progressively worsened throughout the day on 01/13/19.  She also has had increasing cough with clear sputum x 1-2 days.  She denies f/c, cp, hemoptysis, dysuria, hematuria, hematochezia, melena.  She had some dyspnea on exertion, but stated it was not any worse than usual.  Her last BM was on 01/12/19.  She denies any new medications, but continues to smoke 1ppd.   In the ED, she was afebrile and hemodynamically stable.  BMP, LFTs and CBC were essentially unremarkable except some polycythemia and WBC 11.3.  The patient was afebrile hemodynamically stable but was hypoxic with oxygen saturation 87% on room air.  CT of abd/pelvis showed SBO secondary to a large paraumbilical ventral hernia without strangulation.  NG was placed for decompression.  General surgery was consulted to assist.  Assessment/Plan: SBO -NG tube placed for decompression -Remain n.p.o. -General surgery consult appreciated--case discussed with Dr. Arnoldo Morale planning for surgery 01/15/2019 -Continue IV fluids -Optimize electrolytes  Acute respiratory failure with hypoxia -Secondary to COPD exacerbation with a component of aspiration pneumonitis -Stable on 4 L presently -Wean oxygen for saturation greater 92%  COPD exacerbation -Start Pulmicort -Start duo nebs -Start IV Solu-Medrol  Essential hypertension -Restart lisinopril/HCTZ once able to tolerate po -Hydralazine as needed SBP>180  Peripheral neuropathy -Restart gabapentin once able to tolerate po -Serum O13 -Folic acid       Disposition Plan:   Home in 3-4 days  Family Communication:  No Family at bedside  Consultants:  General surgery  Code Status:  FULL    DVT Prophylaxis:  [place] Heparin    Procedures: As Listed in Progress Note Above  Antibiotics: None       Subjective: Patient complains of abdominal pain that is a little better.  Her vomiting has improved.  She continues complain of coughing and wheezing.  She denies any fevers, chills, headache, chest pain, emesis, diarrhea, dysuria, hematuria.  There is no hematochezia or melena.  Objective: Vitals:   01/13/19 1930 01/13/19 2048 01/14/19 0548 01/14/19 0833  BP:  (!) 155/91 (!) 146/74   Pulse: 69 76 62   Resp:  19 18   Temp:  98.3 F (36.8 C) 98.5 F (36.9 C)   TempSrc:      SpO2: 97% 92% (!) 89% 91%    Intake/Output Summary (Last 24 hours) at 01/14/2019 0934 Last data filed at 01/14/2019 0600 Gross per 24 hour  Intake 180.71 ml  Output -  Net 180.71 ml   Weight change:  Exam:   General:  Pt is alert, follows commands appropriately, not in acute distress  HEENT: No icterus, No thrush, No neck mass, [place]/AT  Cardiovascular: RRR, S1/S2, no rubs, no gallops  Respiratory: Bilateral rhonchi.  Bilateral speech or wheeze peer good air movement.  Abdomen: Soft/+BS, non tender, non distended, no guarding  Extremities: 1 + LE edema, No lymphangitis, No petechiae, No rashes, no synovitis   Data Reviewed: I have personally reviewed following labs and imaging studies Basic Metabolic Panel: Recent Labs  Lab 01/13/19 1640 01/14/19 0447  NA 138 143  K 4.0 4.1  CL 98 104  CO2 30 30  GLUCOSE 131* 95  BUN 16 18  CREATININE 0.85 0.97  CALCIUM 9.1 9.0   Liver Function Tests: Recent Labs  Lab 01/13/19 1640  AST 18  ALT 16  ALKPHOS 83  BILITOT 1.1  PROT 8.2*  ALBUMIN 4.3   Recent Labs  Lab 01/13/19 1640  LIPASE 23   No results for input(s): AMMONIA in the last 168 hours. Coagulation Profile: No results for input(s): INR, PROTIME in the last 168 hours. CBC: Recent Labs  Lab 01/13/19 1640 01/14/19 0447  WBC 11.3* 10.3  NEUTROABS 10.1* 7.3  HGB  18.1* 16.9*  HCT 56.8* 54.0*  MCV 100.4* 103.1*  PLT 226 233   Cardiac Enzymes: No results for input(s): CKTOTAL, CKMB, CKMBINDEX, TROPONINI in the last 168 hours. BNP: Invalid input(s): POCBNP CBG: No results for input(s): GLUCAP in the last 168 hours. HbA1C: No results for input(s): HGBA1C in the last 72 hours. Urine analysis:    Component Value Date/Time   COLORURINE YELLOW 01/13/2019 1730   APPEARANCEUR CLOUDY (A) 01/13/2019 1730   LABSPEC 1.017 01/13/2019 1730   PHURINE 8.0 01/13/2019 1730   GLUCOSEU NEGATIVE 01/13/2019 1730   HGBUR NEGATIVE 01/13/2019 1730   BILIRUBINUR NEGATIVE 01/13/2019 1730   KETONESUR NEGATIVE 01/13/2019 1730   PROTEINUR 100 (A) 01/13/2019 1730   UROBILINOGEN 0.2 09/25/2010 0530   NITRITE NEGATIVE 01/13/2019 1730   LEUKOCYTESUR NEGATIVE 01/13/2019 1730   Sepsis Labs: @LABRCNTIP (procalcitonin:4,lacticidven:4) ) Recent Results (from the past 240 hour(s))  SARS Coronavirus 2 [facility] order, Performed in [facility] hospital lab) Nasopharyngeal Nasopharyngeal Swab     Status: None   Collection Time: 01/13/19  3:59 PM   Specimen: Nasopharyngeal Swab  Result Value Ref Range Status   SARS Coronavirus 2 NEGATIVE NEGATIVE Final    Comment: (NOTE) If result is NEGATIVE SARS-CoV-2 target nucleic acids are NOT DETECTED. The SARS-CoV-2 RNA is generally detectable in upper and lower  respiratory specimens during the acute phase of infection. The lowest  concentration of SARS-CoV-2 viral copies this assay can detect is 250  copies / mL. A negative result does not preclude SARS-CoV-2 infection  and should not be used as the sole basis for treatment or other  patient management decisions.  A negative result may occur with  improper specimen collection / handling, submission of specimen other  than nasopharyngeal swab, presence of viral mutation(s) within the  areas targeted by this assay, and inadequate number of viral copies  (<250 copies / mL). A  negative result must be combined with clinical  observations, patient history, and epidemiological information. If result is POSITIVE SARS-CoV-2 target nucleic acids are DETECTED. The SARS-CoV-2 RNA is generally detectable in upper and lower  respiratory specimens dur ing the acute phase of infection.  Positive  results are indicative of active infection with SARS-CoV-2.  Clinical  correlation with patient history and other diagnostic information is  necessary to determine patient infection status.  Positive results do  not rule out bacterial infection or co-infection with other viruses. If result is PRESUMPTIVE POSTIVE SARS-CoV-2 nucleic acids MAY BE PRESENT.   A presumptive positive result was obtained on the submitted specimen  and confirmed on repeat testing.  While 2019 novel coronavirus  (SARS-CoV-2) nucleic acids may be present in the submitted sample  additional confirmatory testing may be necessary for epidemiological  and / or clinical management purposes  to differentiate between  SARS-CoV-2 and other Sarbecovirus currently known to infect humans.  If clinically indicated additional testing with an alternate test  methodology [phone]) is advised. The SARS-CoV-2 RNA is generally  detectable in  upper and lower respiratory sp ecimens during the acute  phase of infection. The expected result is Negative. Fact Sheet for Patients:  StrictlyIdeas.no Fact Sheet for Healthcare Providers: BankingDealers.co.za This test is not yet approved or cleared by the [place] FDA and has been authorized for detection and/or diagnosis of SARS-CoV-2 by FDA under an Emergency Use Authorization (EUA).  This EUA will remain in effect (meaning this test can be used) for the duration of the COVID-19 declaration under Section 564(b)(1) of the Act, 21 U.S.C. section 360bbb-3(b)(1), unless the authorization is terminated or revoked sooner. Performed  at [facility], [address] [place], [place] [address]      Scheduled Meds: . budesonide (PULMICORT) nebulizer solution  0.5 mg Nebulization BID  . influenza vac split quadrivalent PF  0.5 mL Intramuscular Tomorrow-1000  . ipratropium-albuterol  3 mL Nebulization Q6H  . mouth rinse  15 mL Mouth Rinse BID  . methylPREDNISolone (SOLU-MEDROL) injection  60 mg Intravenous Q8H  . pneumococcal 23 valent vaccine  0.5 mL Intramuscular Tomorrow-1000  . sodium chloride flush  3 mL Intravenous Q12H   Continuous Infusions: . sodium chloride    . dextrose 5 % and 0.45 % NaCl with KCl 20 mEq/L      Procedures/Studies: Ct Abdomen Pelvis W Contrast  Result Date: 01/13/2019 CLINICAL DATA:  Right-sided abdominal pain and nausea and vomiting beginning last night. Prior cholecystectomy. EXAM: CT ABDOMEN AND PELVIS WITH CONTRAST TECHNIQUE: Multidetector CT imaging of the abdomen and pelvis was performed using the standard protocol following bolus administration of intravenous contrast. CONTRAST:  122mL OMNIPAQUE IOHEXOL 300 MG/ML  SOLN COMPARISON:  05/14/2012 FINDINGS: Lower Chest: Increased linear opacity in both lung bases since 2014, which may be due to subsegmental atelectasis or scarring. Hepatobiliary: No hepatic masses identified. Prior cholecystectomy. No evidence of biliary obstruction. Pancreas:  No mass or inflammatory changes. Spleen: Within normal limits in size and appearance. Adrenals/Urinary Tract: No masses identified. Several small left renal cysts again noted. No evidence of hydronephrosis. Stomach/Bowel: Large paraumbilical ventral hernia is again seen containing multiple small bowel loops. Moderate dilatation of small bowel is seen within the hernia and proximal to the hernia, consistent with small-bowel obstruction. No evidence of small bowel wall thickening or pneumatosis. No evidence of inflammatory process or abnormal fluid collections. Diverticulosis is seen mainly involving the  descending and sigmoid colon, however there is no evidence of diverticulitis. Vascular/Lymphatic: No pathologically enlarged lymph nodes. No abdominal aortic aneurysm. Aortic atherosclerosis. Reproductive:  No mass or other significant abnormality. Other:  None. Musculoskeletal:  No suspicious bone lesions identified. IMPRESSION: 1. Small bowel obstruction due to large paraumbilical ventral hernia, which contains multiple small bowel loops. No signs of bowel strangulation. 2. Colonic diverticulosis, without radiographic evidence of diverticulitis. Aortic Atherosclerosis (ICD10-I70.0). Electronically Signed   By: Marlaine Hind M.D.   On: 01/13/2019 18:50   Dg Chest Portable 1 View  Result Date: 01/13/2019 CLINICAL DATA:  NG tube placement EXAM: PORTABLE CHEST 1 VIEW COMPARISON:  Radiograph 01/13/2019 FINDINGS: Interval placement of a transesophageal tube with tip and side port distal to the GE junction, curling within the gastric lumen in the left upper quadrant. Redemonstration of the diffuse interstitial opacities and moderate cardiomegaly. No pneumothorax. No new consolidative process. No acute osseous or soft tissue abnormality. Degenerative changes are present in the imaged spine and shoulders. IMPRESSION: 1. Appropriate positioning of the transesophageal tube with the tip and side port distal to the GE junction 2. Slightly diminished lung volumes with  persistent interstitial pulmonary edema and cardiomegaly. Electronically Signed   By: Lovena Le M.D.   On: 01/13/2019 20:15   Dg Chest Portable 1 View  Result Date: 01/13/2019 CLINICAL DATA:  Cough. Nausea and vomiting. Right-sided abdominal pain. EXAM: PORTABLE CHEST 1 VIEW COMPARISON:  09/25/2010 FINDINGS: Mild-to-moderate cardiomegaly. Diffuse interstitial infiltrates, suspicious for interstitial edema. Stable linear opacity in right lower lung, consistent with scarring. No evidence of pulmonary consolidation or pleural effusion. IMPRESSION:  Cardiomegaly and diffuse interstitial infiltrates, suspicious for mild congestive heart failure. Electronically Signed   By: Marlaine Hind M.D.   On: 01/13/2019 16:41    Orson Eva, DO  [facility] Pager [phone]  If 7PM-7AM, please contact night-coverage [email or web address] Password [facility] 01/14/2019, 9:34 AM   LOS: 0 days

## 2019-01-14 NOTE — Consult Note (Signed)
Reason for Consult: Small bowel obstruction secondary to incisional hernia Referring Physician: Dr. Maryland Pink is an 58 y.o. female.  HPI: Patient is a 58 year old morbidly obese white female with a history of COPD who presented to the emergency room with a 2-day history of worsening nausea and vomiting.  She has had a known incisional hernia with her last admission being in 2014.  She did get some relief with the emesis and NG tube placement.  A CT scan of the abdomen was performed which revealed a very large hernia around the umbilicus with a significant portion of her small intestine extraperitoneally.  There was no evidence of strangulation.  She was admitted to the hospital for NG tube decompression.  She does have COPD and requires pulmonary care.  She currently has 2 out of 10 abdominal pain.  Past Medical History:  Diagnosis Date  . Bronchitis   . COPD (chronic obstructive pulmonary disease) ([place])   . Degenerative disc disease, lumbar   . Hiatal hernia   . Hypertension     Past Surgical History:  Procedure Laterality Date  . CHOLECYSTECTOMY    . degenerative bone disease      Family History  Problem Relation Age of Onset  . Sudden Cardiac Death Neg Hx     Social History:  reports that she has been smoking. She has been smoking about 1.00 pack per day. She has never used smokeless tobacco. She reports that she does not drink alcohol or use drugs.  Allergies:  Allergies  Allergen Reactions  . Codeine Hives  . Penicillins Hives    Did it involve swelling of the face/tongue/throat, SOB, or low BP? No Did it involve sudden or severe rash/hives, skin peeling, or any reaction on the inside of your mouth or nose? Yes Did you need to seek medical attention at a hospital or doctor's office? Unknown When did it last happen?Over 10 years If all above answers are "NO", may proceed with cephalosporin use.     Medications: I have reviewed the patient's current  medications.  Results for orders placed or performed during the hospital encounter of 01/13/19 (from the past 48 hour(s))  SARS Coronavirus 2 [facility] order, Performed in [facility] hospital lab) Nasopharyngeal Nasopharyngeal Swab     Status: None   Collection Time: 01/13/19  3:59 PM   Specimen: Nasopharyngeal Swab  Result Value Ref Range   SARS Coronavirus 2 NEGATIVE NEGATIVE    Comment: (NOTE) If result is NEGATIVE SARS-CoV-2 target nucleic acids are NOT DETECTED. The SARS-CoV-2 RNA is generally detectable in upper and lower  respiratory specimens during the acute phase of infection. The lowest  concentration of SARS-CoV-2 viral copies this assay can detect is 250  copies / mL. A negative result does not preclude SARS-CoV-2 infection  and should not be used as the sole basis for treatment or other  patient management decisions.  A negative result may occur with  improper specimen collection / handling, submission of specimen other  than nasopharyngeal swab, presence of viral mutation(s) within the  areas targeted by this assay, and inadequate number of viral copies  (<250 copies / mL). A negative result must be combined with clinical  observations, patient history, and epidemiological information. If result is POSITIVE SARS-CoV-2 target nucleic acids are DETECTED. The SARS-CoV-2 RNA is generally detectable in upper and lower  respiratory specimens dur ing the acute phase of infection.  Positive  results are indicative of active infection with SARS-CoV-2.  Clinical  correlation with patient history and other diagnostic information is  necessary to determine patient infection status.  Positive results do  not rule out bacterial infection or co-infection with other viruses. If result is PRESUMPTIVE POSTIVE SARS-CoV-2 nucleic acids MAY BE PRESENT.   A presumptive positive result was obtained on the submitted specimen  and confirmed on repeat testing.  While 2019 novel coronavirus   (SARS-CoV-2) nucleic acids may be present in the submitted sample  additional confirmatory testing may be necessary for epidemiological  and / or clinical management purposes  to differentiate between  SARS-CoV-2 and other Sarbecovirus currently known to infect humans.  If clinically indicated additional testing with an alternate test  methodology [phone]) is advised. The SARS-CoV-2 RNA is generally  detectable in upper and lower respiratory sp ecimens during the acute  phase of infection. The expected result is Negative. Fact Sheet for Patients:  StrictlyIdeas.no Fact Sheet for Healthcare Providers: BankingDealers.co.za This test is not yet approved or cleared by the [place] FDA and has been authorized for detection and/or diagnosis of SARS-CoV-2 by FDA under an Emergency Use Authorization (EUA).  This EUA will remain in effect (meaning this test can be used) for the duration of the COVID-19 declaration under Section 564(b)(1) of the Act, 21 U.S.C. section 360bbb-3(b)(1), unless the authorization is terminated or revoked sooner. Performed at [facility], [address] [place], [place] [address]   Comprehensive metabolic panel     Status: Abnormal   Collection Time: 01/13/19  4:40 PM  Result Value Ref Range   Sodium 138 135 - 145 mmol/L   Potassium 4.0 3.5 - 5.1 mmol/L   Chloride 98 98 - 111 mmol/L   CO2 30 22 - 32 mmol/L   Glucose, Bld 131 (H) 70 - 99 mg/dL   BUN 16 6 - 20 mg/dL   Creatinine, Ser 0.85 0.44 - 1.00 mg/dL   Calcium 9.1 8.9 - 10.3 mg/dL   Total Protein 8.2 (H) 6.5 - 8.1 g/dL   Albumin 4.3 3.5 - 5.0 g/dL   AST 18 15 - 41 U/L   ALT 16 0 - 44 U/L   Alkaline Phosphatase 83 38 - 126 U/L   Total Bilirubin 1.1 0.3 - 1.2 mg/dL   GFR calc non Af Amer >60 >60 mL/min   GFR calc Af Amer >60 >60 mL/min   Anion gap 10 5 - 15    Comment: Performed at [facility], [address], [place], [place] [address]  CBC with  Differential     Status: Abnormal   Collection Time: 01/13/19  4:40 PM  Result Value Ref Range   WBC 11.3 (H) 4.0 - 10.5 K/uL   RBC 5.66 (H) 3.87 - 5.11 MIL/uL   Hemoglobin 18.1 (H) 12.0 - 15.0 g/dL   HCT 56.8 (H) 36.0 - 46.0 %   MCV 100.4 (H) 80.0 - 100.0 fL   MCH 32.0 26.0 - 34.0 pg   MCHC 31.9 30.0 - 36.0 g/dL   RDW 14.3 11.5 - 15.5 %   Platelets 226 150 - 400 K/uL   nRBC 0.0 0.0 - 0.2 %   Neutrophils Relative % 90 %   Neutro Abs 10.1 (H) 1.7 - 7.7 K/uL   Lymphocytes Relative 6 %   Lymphs Abs 0.7 0.7 - 4.0 K/uL   Monocytes Relative 3 %   Monocytes Absolute 0.3 0.1 - 1.0 K/uL   Eosinophils Relative 0 %   Eosinophils Absolute 0.0 0.0 - 0.5 K/uL  Basophils Relative 1 %   Basophils Absolute 0.1 0.0 - 0.1 K/uL   Immature Granulocytes 0 %   Abs Immature Granulocytes 0.02 0.00 - 0.07 K/uL    Comment: Performed at [facility], [address], [place], [place] [address]  Lipase, blood     Status: None   Collection Time: 01/13/19  4:40 PM  Result Value Ref Range   Lipase 23 11 - 51 U/L    Comment: Performed at [facility], [address], [place], [place] [address]  Brain natriuretic peptide     Status: None   Collection Time: 01/13/19  4:40 PM  Result Value Ref Range   B Natriuretic Peptide 36.0 0.0 - 100.0 pg/mL    Comment: Performed at [facility], [address], [place], [place] [address]  Procalcitonin - Baseline     Status: None   Collection Time: 01/13/19  4:40 PM  Result Value Ref Range   Procalcitonin <0.10 ng/mL    Comment:        Interpretation: PCT (Procalcitonin) <= 0.5 ng/mL: Systemic infection (sepsis) is not likely. Local bacterial infection is possible. (NOTE)       Sepsis PCT Algorithm           Lower Respiratory Tract                                      Infection PCT Algorithm    ----------------------------     ----------------------------         PCT < 0.25 ng/mL                PCT < 0.10 ng/mL         Strongly encourage             Strongly  discourage   discontinuation of antibiotics    initiation of antibiotics    ----------------------------     -----------------------------       PCT 0.25 - 0.50 ng/mL            PCT 0.10 - 0.25 ng/mL               OR       >80% decrease in PCT            Discourage initiation of                                            antibiotics      Encourage discontinuation           of antibiotics    ----------------------------     -----------------------------         PCT >= 0.50 ng/mL              PCT 0.26 - 0.50 ng/mL               AND        <80% decrease in PCT             Encourage initiation of                                             antibiotics  Encourage continuation           of antibiotics    ----------------------------     -----------------------------        PCT >= 0.50 ng/mL                  PCT > 0.50 ng/mL               AND         increase in PCT                  Strongly encourage                                      initiation of antibiotics    Strongly encourage escalation           of antibiotics                                     -----------------------------                                           PCT <= 0.25 ng/mL                                                 OR                                        > 80% decrease in PCT                                     Discontinue / Do not initiate                                             antibiotics Performed at [facility], [address] [place], [place] [address]   Urinalysis, Routine w reflex microscopic     Status: Abnormal   Collection Time: 01/13/19  5:30 PM  Result Value Ref Range   Color, Urine YELLOW YELLOW   APPearance CLOUDY (A) CLEAR   Specific Gravity, Urine 1.017 1.005 - 1.030   pH 8.0 5.0 - 8.0   Glucose, UA NEGATIVE NEGATIVE mg/dL   Hgb urine dipstick NEGATIVE NEGATIVE   Bilirubin Urine NEGATIVE NEGATIVE   Ketones, ur NEGATIVE NEGATIVE mg/dL   Protein, ur 100 (A) NEGATIVE mg/dL    Nitrite NEGATIVE NEGATIVE   Leukocytes,Ua NEGATIVE NEGATIVE   RBC / HPF 0-5 0 - 5 RBC/hpf   WBC, UA 6-10 0 - 5 WBC/hpf   Bacteria, UA RARE (A) NONE SEEN   Squamous Epithelial / LPF 0-5 0 - 5   Amorphous Crystal PRESENT     Comment: Performed at [facility], [address], [place], [place] [address]  Procalcitonin     Status: None   Collection  Time: 01/14/19  4:47 AM  Result Value Ref Range   Procalcitonin <0.10 ng/mL    Comment:        Interpretation: PCT (Procalcitonin) <= 0.5 ng/mL: Systemic infection (sepsis) is not likely. Local bacterial infection is possible. (NOTE)       Sepsis PCT Algorithm           Lower Respiratory Tract                                      Infection PCT Algorithm    ----------------------------     ----------------------------         PCT < 0.25 ng/mL                PCT < 0.10 ng/mL         Strongly encourage             Strongly discourage   discontinuation of antibiotics    initiation of antibiotics    ----------------------------     -----------------------------       PCT 0.25 - 0.50 ng/mL            PCT 0.10 - 0.25 ng/mL               OR       >80% decrease in PCT            Discourage initiation of                                            antibiotics      Encourage discontinuation           of antibiotics    ----------------------------     -----------------------------         PCT >= 0.50 ng/mL              PCT 0.26 - 0.50 ng/mL               AND        <80% decrease in PCT             Encourage initiation of                                             antibiotics       Encourage continuation           of antibiotics    ----------------------------     -----------------------------        PCT >= 0.50 ng/mL                  PCT > 0.50 ng/mL               AND         increase in PCT                  Strongly encourage                                      initiation of antibiotics    Strongly encourage escalation  of  antibiotics                                     -----------------------------                                           PCT <= 0.25 ng/mL                                                 OR                                        > 80% decrease in PCT                                     Discontinue / Do not initiate                                             antibiotics Performed at [facility], [address] [place], [place] [address]   Basic metabolic panel     Status: None   Collection Time: 01/14/19  4:47 AM  Result Value Ref Range   Sodium 143 135 - 145 mmol/L   Potassium 4.1 3.5 - 5.1 mmol/L   Chloride 104 98 - 111 mmol/L   CO2 30 22 - 32 mmol/L   Glucose, Bld 95 70 - 99 mg/dL   BUN 18 6 - 20 mg/dL   Creatinine, Ser 0.97 0.44 - 1.00 mg/dL   Calcium 9.0 8.9 - 10.3 mg/dL   GFR calc non Af Amer >60 >60 mL/min   GFR calc Af Amer >60 >60 mL/min   Anion gap 9 5 - 15    Comment: Performed at [facility], [address] [place], [place] [address]  CBC WITH DIFFERENTIAL     Status: Abnormal   Collection Time: 01/14/19  4:47 AM  Result Value Ref Range   WBC 10.3 4.0 - 10.5 K/uL   RBC 5.24 (H) 3.87 - 5.11 MIL/uL   Hemoglobin 16.9 (H) 12.0 - 15.0 g/dL   HCT 54.0 (H) 36.0 - 46.0 %   MCV 103.1 (H) 80.0 - 100.0 fL   MCH 32.3 26.0 - 34.0 pg   MCHC 31.3 30.0 - 36.0 g/dL   RDW 14.6 11.5 - 15.5 %   Platelets 233 150 - 400 K/uL   nRBC 0.0 0.0 - 0.2 %   Neutrophils Relative % 69 %   Neutro Abs 7.3 1.7 - 7.7 K/uL   Lymphocytes Relative 19 %   Lymphs Abs 2.0 0.7 - 4.0 K/uL   Monocytes Relative 9 %   Monocytes Absolute 0.9 0.1 - 1.0 K/uL   Eosinophils Relative 1 %   Eosinophils Absolute 0.1 0.0 - 0.5 K/uL   Basophils Relative 1 %   Basophils Absolute 0.1 0.0 - 0.1 K/uL   Immature Granulocytes 1 %   Abs  Immature Granulocytes 0.05 0.00 - 0.07 K/uL    Comment: Performed at [facility], [address], [place], [place] [address]    Ct Abdomen Pelvis W Contrast  Result Date:  01/13/2019 CLINICAL DATA:  Right-sided abdominal pain and nausea and vomiting beginning last night. Prior cholecystectomy. EXAM: CT ABDOMEN AND PELVIS WITH CONTRAST TECHNIQUE: Multidetector CT imaging of the abdomen and pelvis was performed using the standard protocol following bolus administration of intravenous contrast. CONTRAST:  165mL OMNIPAQUE IOHEXOL 300 MG/ML  SOLN COMPARISON:  05/14/2012 FINDINGS: Lower Chest: Increased linear opacity in both lung bases since 2014, which may be due to subsegmental atelectasis or scarring. Hepatobiliary: No hepatic masses identified. Prior cholecystectomy. No evidence of biliary obstruction. Pancreas:  No mass or inflammatory changes. Spleen: Within normal limits in size and appearance. Adrenals/Urinary Tract: No masses identified. Several small left renal cysts again noted. No evidence of hydronephrosis. Stomach/Bowel: Large paraumbilical ventral hernia is again seen containing multiple small bowel loops. Moderate dilatation of small bowel is seen within the hernia and proximal to the hernia, consistent with small-bowel obstruction. No evidence of small bowel wall thickening or pneumatosis. No evidence of inflammatory process or abnormal fluid collections. Diverticulosis is seen mainly involving the descending and sigmoid colon, however there is no evidence of diverticulitis. Vascular/Lymphatic: No pathologically enlarged lymph nodes. No abdominal aortic aneurysm. Aortic atherosclerosis. Reproductive:  No mass or other significant abnormality. Other:  None. Musculoskeletal:  No suspicious bone lesions identified. IMPRESSION: 1. Small bowel obstruction due to large paraumbilical ventral hernia, which contains multiple small bowel loops. No signs of bowel strangulation. 2. Colonic diverticulosis, without radiographic evidence of diverticulitis. Aortic Atherosclerosis (ICD10-I70.0). Electronically Signed   By: Marlaine Hind M.D.   On: 01/13/2019 18:50   Dg Chest Portable 1  View  Result Date: 01/13/2019 CLINICAL DATA:  NG tube placement EXAM: PORTABLE CHEST 1 VIEW COMPARISON:  Radiograph 01/13/2019 FINDINGS: Interval placement of a transesophageal tube with tip and side port distal to the GE junction, curling within the gastric lumen in the left upper quadrant. Redemonstration of the diffuse interstitial opacities and moderate cardiomegaly. No pneumothorax. No new consolidative process. No acute osseous or soft tissue abnormality. Degenerative changes are present in the imaged spine and shoulders. IMPRESSION: 1. Appropriate positioning of the transesophageal tube with the tip and side port distal to the GE junction 2. Slightly diminished lung volumes with persistent interstitial pulmonary edema and cardiomegaly. Electronically Signed   By: Lovena Le M.D.   On: 01/13/2019 20:15   Dg Chest Portable 1 View  Result Date: 01/13/2019 CLINICAL DATA:  Cough. Nausea and vomiting. Right-sided abdominal pain. EXAM: PORTABLE CHEST 1 VIEW COMPARISON:  09/25/2010 FINDINGS: Mild-to-moderate cardiomegaly. Diffuse interstitial infiltrates, suspicious for interstitial edema. Stable linear opacity in right lower lung, consistent with scarring. No evidence of pulmonary consolidation or pleural effusion. IMPRESSION: Cardiomegaly and diffuse interstitial infiltrates, suspicious for mild congestive heart failure. Electronically Signed   By: Marlaine Hind M.D.   On: 01/13/2019 16:41    ROS:  Pertinent items are noted in HPI.  Blood pressure (!) 146/74, pulse 62, temperature 98.5 F (36.9 C), resp. rate 18, SpO2 91 %. Physical Exam: Pleasant morbidly obese white female no acute distress. Head is normocephalic, atraumatic Lungs are clear to auscultation with your breath sounds bilaterally Heart examination reveals a regular rate and rhythm without S3, S4, murmurs Abdomen is large with an obvious periumbilical hernia, extending down into the right pannus.  I did not attempt to reduce it.  Her  abdomen is soft.  No rigidity is noted.  CT scan images personally reviewed  Assessment/Plan: Impression: Chronic incisional hernia with new onset bowel obstruction, COPD, morbid obesity Plan: Patient is scheduled for incisional herniorrhaphy with mesh tomorrow.  The risks and benefits of the procedure including bleeding, infection, cardiopulmonary difficulties, the possibility of a bowel resection, and the possibility of a recurrence of the hernia were fully explained to the patient, who gave informed consent.  [name] 01/14/2019, 9:22 AM

## 2019-01-14 NOTE — H&P (View-Only) (Signed)
Reason for Consult: Small bowel obstruction secondary to incisional hernia Referring Physician: Dr. [place] Veronica Horton is an 58 y.o. female.  HPI: Patient is a 58 year old morbidly obese white female with a history of COPD who presented to the emergency room with a 2-day history of worsening nausea and vomiting.  She has had a known incisional hernia with her last admission being in 2014.  She did get some relief with the emesis and NG tube placement.  A CT scan of the abdomen was performed which revealed a very large hernia around the umbilicus with a significant portion of her small intestine extraperitoneally.  There was no evidence of strangulation.  She was admitted to the hospital for NG tube decompression.  She does have COPD and requires pulmonary care.  She currently has 2 out of 10 abdominal pain.  Past Medical History:  Diagnosis Date  . Bronchitis   . COPD (chronic obstructive pulmonary disease) ([place])   . Degenerative disc disease, lumbar   . Hiatal hernia   . Hypertension     Past Surgical History:  Procedure Laterality Date  . CHOLECYSTECTOMY    . degenerative bone disease      Family History  Problem Relation Age of Onset  . Sudden Cardiac Death Neg Hx     Social History:  reports that she has been smoking. She has been smoking about 1.00 pack per day. She has never used smokeless tobacco. She reports that she does not drink alcohol or use drugs.  Allergies:  Allergies  Allergen Reactions  . Codeine Hives  . Penicillins Hives    Did it involve swelling of the face/tongue/throat, SOB, or low BP? No Did it involve sudden or severe rash/hives, skin peeling, or any reaction on the inside of your mouth or nose? Yes Did you need to seek medical attention at a hospital or doctor's office? Unknown When did it last happen?Over 10 years If all above answers are "NO", may proceed with cephalosporin use.     Medications: I have reviewed the patient's current  medications.  Results for orders placed or performed during the hospital encounter of 01/13/19 (from the past 48 hour(s))  SARS Coronavirus 2 [facility] order, Performed in [facility] hospital lab) Nasopharyngeal Nasopharyngeal Swab     Status: None   Collection Time: 01/13/19  3:59 PM   Specimen: Nasopharyngeal Swab  Result Value Ref Range   SARS Coronavirus 2 NEGATIVE NEGATIVE    Comment: (NOTE) If result is NEGATIVE SARS-CoV-2 target nucleic acids are NOT DETECTED. The SARS-CoV-2 RNA is generally detectable in upper and lower  respiratory specimens during the acute phase of infection. The lowest  concentration of SARS-CoV-2 viral copies this assay can detect is 250  copies / mL. A negative result does not preclude SARS-CoV-2 infection  and should not be used as the sole basis for treatment or other  patient management decisions.  A negative result may occur with  improper specimen collection / handling, submission of specimen other  than nasopharyngeal swab, presence of viral mutation(s) within the  areas targeted by this assay, and inadequate number of viral copies  (<250 copies / mL). A negative result must be combined with clinical  observations, patient history, and epidemiological information. If result is POSITIVE SARS-CoV-2 target nucleic acids are DETECTED. The SARS-CoV-2 RNA is generally detectable in upper and lower  respiratory specimens dur ing the acute phase of infection.  Positive  results are indicative of active infection with SARS-CoV-2.  Clinical  correlation with patient history and other diagnostic information is  necessary to determine patient infection status.  Positive results do  not rule out bacterial infection or co-infection with other viruses. If result is PRESUMPTIVE POSTIVE SARS-CoV-2 nucleic acids MAY BE PRESENT.   A presumptive positive result was obtained on the submitted specimen  and confirmed on repeat testing.  While 2019 novel coronavirus   (SARS-CoV-2) nucleic acids may be present in the submitted sample  additional confirmatory testing may be necessary for epidemiological  and / or clinical management purposes  to differentiate between  SARS-CoV-2 and other Sarbecovirus currently known to infect humans.  If clinically indicated additional testing with an alternate test  methodology [phone]) is advised. The SARS-CoV-2 RNA is generally  detectable in upper and lower respiratory sp ecimens during the acute  phase of infection. The expected result is Negative. Fact Sheet for Patients:  StrictlyIdeas.no Fact Sheet for Healthcare Providers: BankingDealers.co.za This test is not yet approved or cleared by the [place] FDA and has been authorized for detection and/or diagnosis of SARS-CoV-2 by FDA under an Emergency Use Authorization (EUA).  This EUA will remain in effect (meaning this test can be used) for the duration of the COVID-19 declaration under Section 564(b)(1) of the Act, 21 U.S.C. section 360bbb-3(b)(1), unless the authorization is terminated or revoked sooner. Performed at [facility], [address], [place], [place] [address]   Comprehensive metabolic panel     Status: Abnormal   Collection Time: 01/13/19  4:40 PM  Result Value Ref Range   Sodium 138 135 - 145 mmol/L   Potassium 4.0 3.5 - 5.1 mmol/L   Chloride 98 98 - 111 mmol/L   CO2 30 22 - 32 mmol/L   Glucose, Bld 131 (H) 70 - 99 mg/dL   BUN 16 6 - 20 mg/dL   Creatinine, Ser 0.85 0.44 - 1.00 mg/dL   Calcium 9.1 8.9 - 10.3 mg/dL   Total Protein 8.2 (H) 6.5 - 8.1 g/dL   Albumin 4.3 3.5 - 5.0 g/dL   AST 18 15 - 41 U/L   ALT 16 0 - 44 U/L   Alkaline Phosphatase 83 38 - 126 U/L   Total Bilirubin 1.1 0.3 - 1.2 mg/dL   GFR calc non Af Amer >60 >60 mL/min   GFR calc Af Amer >60 >60 mL/min   Anion gap 10 5 - 15    Comment: Performed at [facility], [address] [place], [place] [address]  CBC with  Differential     Status: Abnormal   Collection Time: 01/13/19  4:40 PM  Result Value Ref Range   WBC 11.3 (H) 4.0 - 10.5 K/uL   RBC 5.66 (H) 3.87 - 5.11 MIL/uL   Hemoglobin 18.1 (H) 12.0 - 15.0 g/dL   HCT 56.8 (H) 36.0 - 46.0 %   MCV 100.4 (H) 80.0 - 100.0 fL   MCH 32.0 26.0 - 34.0 pg   MCHC 31.9 30.0 - 36.0 g/dL   RDW 14.3 11.5 - 15.5 %   Platelets 226 150 - 400 K/uL   nRBC 0.0 0.0 - 0.2 %   Neutrophils Relative % 90 %   Neutro Abs 10.1 (H) 1.7 - 7.7 K/uL   Lymphocytes Relative 6 %   Lymphs Abs 0.7 0.7 - 4.0 K/uL   Monocytes Relative 3 %   Monocytes Absolute 0.3 0.1 - 1.0 K/uL   Eosinophils Relative 0 %   Eosinophils Absolute 0.0 0.0 - 0.5 K/uL  Basophils Relative 1 %   Basophils Absolute 0.1 0.0 - 0.1 K/uL   Immature Granulocytes 0 %   Abs Immature Granulocytes 0.02 0.00 - 0.07 K/uL    Comment: Performed at [facility], [address], [place], [place] [address]  Lipase, blood     Status: None   Collection Time: 01/13/19  4:40 PM  Result Value Ref Range   Lipase 23 11 - 51 U/L    Comment: Performed at [facility], [address], [place], [place] [address]  Brain natriuretic peptide     Status: None   Collection Time: 01/13/19  4:40 PM  Result Value Ref Range   B Natriuretic Peptide 36.0 0.0 - 100.0 pg/mL    Comment: Performed at [facility], [address] [place],  [address]  Procalcitonin - Baseline     Status: None   Collection Time: 01/13/19  4:40 PM  Result Value Ref Range   Procalcitonin <0.10 ng/mL    Comment:        Interpretation: PCT (Procalcitonin) <= 0.5 ng/mL: Systemic infection (sepsis) is not likely. Local bacterial infection is possible. (NOTE)       Sepsis PCT Algorithm           Lower Respiratory Tract                                      Infection PCT Algorithm    ----------------------------     ----------------------------         PCT < 0.25 ng/mL                PCT < 0.10 ng/mL         Strongly encourage             Strongly  discourage   discontinuation of antibiotics    initiation of antibiotics    ----------------------------     -----------------------------       PCT 0.25 - 0.50 ng/mL            PCT 0.10 - 0.25 ng/mL               OR       >80% decrease in PCT            Discourage initiation of                                            antibiotics      Encourage discontinuation           of antibiotics    ----------------------------     -----------------------------         PCT >= 0.50 ng/mL              PCT 0.26 - 0.50 ng/mL               AND        <80% decrease in PCT             Encourage initiation of                                             antibiotics  Encourage continuation           of antibiotics    ----------------------------     -----------------------------        PCT >= 0.50 ng/mL                  PCT > 0.50 ng/mL               AND         increase in PCT                  Strongly encourage                                      initiation of antibiotics    Strongly encourage escalation           of antibiotics                                     -----------------------------                                           PCT <= 0.25 ng/mL                                                 OR                                        > 80% decrease in PCT                                     Discontinue / Do not initiate                                             antibiotics Performed at [facility], [address], [place], [place] [address]   Urinalysis, Routine w reflex microscopic     Status: Abnormal   Collection Time: 01/13/19  5:30 PM  Result Value Ref Range   Color, Urine YELLOW YELLOW   APPearance CLOUDY (A) CLEAR   Specific Gravity, Urine 1.017 1.005 - 1.030   pH 8.0 5.0 - 8.0   Glucose, UA NEGATIVE NEGATIVE mg/dL   Hgb urine dipstick NEGATIVE NEGATIVE   Bilirubin Urine NEGATIVE NEGATIVE   Ketones, ur NEGATIVE NEGATIVE mg/dL   Protein, ur 100 (A) NEGATIVE mg/dL    Nitrite NEGATIVE NEGATIVE   Leukocytes,Ua NEGATIVE NEGATIVE   RBC / HPF 0-5 0 - 5 RBC/hpf   WBC, UA 6-10 0 - 5 WBC/hpf   Bacteria, UA RARE (A) NONE SEEN   Squamous Epithelial / LPF 0-5 0 - 5   Amorphous Crystal PRESENT     Comment: Performed at [facility], [address], [place], [place] [address]  Procalcitonin     Status: None   Collection  Time: 01/14/19  4:47 AM  Result Value Ref Range   Procalcitonin <0.10 ng/mL    Comment:        Interpretation: PCT (Procalcitonin) <= 0.5 ng/mL: Systemic infection (sepsis) is not likely. Local bacterial infection is possible. (NOTE)       Sepsis PCT Algorithm           Lower Respiratory Tract                                      Infection PCT Algorithm    ----------------------------     ----------------------------         PCT < 0.25 ng/mL                PCT < 0.10 ng/mL         Strongly encourage             Strongly discourage   discontinuation of antibiotics    initiation of antibiotics    ----------------------------     -----------------------------       PCT 0.25 - 0.50 ng/mL            PCT 0.10 - 0.25 ng/mL               OR       >80% decrease in PCT            Discourage initiation of                                            antibiotics      Encourage discontinuation           of antibiotics    ----------------------------     -----------------------------         PCT >= 0.50 ng/mL              PCT 0.26 - 0.50 ng/mL               AND        <80% decrease in PCT             Encourage initiation of                                             antibiotics       Encourage continuation           of antibiotics    ----------------------------     -----------------------------        PCT >= 0.50 ng/mL                  PCT > 0.50 ng/mL               AND         increase in PCT                  Strongly encourage                                      initiation of antibiotics    Strongly encourage escalation  of  antibiotics                                     -----------------------------                                           PCT <= 0.25 ng/mL                                                 OR                                        > 80% decrease in PCT                                     Discontinue / Do not initiate                                             antibiotics Performed at [facility], [address] [place], [place] [address]   Basic metabolic panel     Status: None   Collection Time: 01/14/19  4:47 AM  Result Value Ref Range   Sodium 143 135 - 145 mmol/L   Potassium 4.1 3.5 - 5.1 mmol/L   Chloride 104 98 - 111 mmol/L   CO2 30 22 - 32 mmol/L   Glucose, Bld 95 70 - 99 mg/dL   BUN 18 6 - 20 mg/dL   Creatinine, Ser 0.97 0.44 - 1.00 mg/dL   Calcium 9.0 8.9 - 10.3 mg/dL   GFR calc non Af Amer >60 >60 mL/min   GFR calc Af Amer >60 >60 mL/min   Anion gap 9 5 - 15    Comment: Performed at [facility], [address] [place],  [address]  CBC WITH DIFFERENTIAL     Status: Abnormal   Collection Time: 01/14/19  4:47 AM  Result Value Ref Range   WBC 10.3 4.0 - 10.5 K/uL   RBC 5.24 (H) 3.87 - 5.11 MIL/uL   Hemoglobin 16.9 (H) 12.0 - 15.0 g/dL   HCT 54.0 (H) 36.0 - 46.0 %   MCV 103.1 (H) 80.0 - 100.0 fL   MCH 32.3 26.0 - 34.0 pg   MCHC 31.3 30.0 - 36.0 g/dL   RDW 14.6 11.5 - 15.5 %   Platelets 233 150 - 400 K/uL   nRBC 0.0 0.0 - 0.2 %   Neutrophils Relative % 69 %   Neutro Abs 7.3 1.7 - 7.7 K/uL   Lymphocytes Relative 19 %   Lymphs Abs 2.0 0.7 - 4.0 K/uL   Monocytes Relative 9 %   Monocytes Absolute 0.9 0.1 - 1.0 K/uL   Eosinophils Relative 1 %   Eosinophils Absolute 0.1 0.0 - 0.5 K/uL   Basophils Relative 1 %   Basophils Absolute 0.1 0.0 - 0.1 K/uL   Immature Granulocytes 1 %   Abs  Immature Granulocytes 0.05 0.00 - 0.07 K/uL    Comment: Performed at [facility], [address], [place], [place] [address]    Ct Abdomen Pelvis W Contrast  Result Date:  01/13/2019 CLINICAL DATA:  Right-sided abdominal pain and nausea and vomiting beginning last night. Prior cholecystectomy. EXAM: CT ABDOMEN AND PELVIS WITH CONTRAST TECHNIQUE: Multidetector CT imaging of the abdomen and pelvis was performed using the standard protocol following bolus administration of intravenous contrast. CONTRAST:  161mL OMNIPAQUE IOHEXOL 300 MG/ML  SOLN COMPARISON:  05/14/2012 FINDINGS: Lower Chest: Increased linear opacity in both lung bases since 2014, which may be due to subsegmental atelectasis or scarring. Hepatobiliary: No hepatic masses identified. Prior cholecystectomy. No evidence of biliary obstruction. Pancreas:  No mass or inflammatory changes. Spleen: Within normal limits in size and appearance. Adrenals/Urinary Tract: No masses identified. Several small left renal cysts again noted. No evidence of hydronephrosis. Stomach/Bowel: Large paraumbilical ventral hernia is again seen containing multiple small bowel loops. Moderate dilatation of small bowel is seen within the hernia and proximal to the hernia, consistent with small-bowel obstruction. No evidence of small bowel wall thickening or pneumatosis. No evidence of inflammatory process or abnormal fluid collections. Diverticulosis is seen mainly involving the descending and sigmoid colon, however there is no evidence of diverticulitis. Vascular/Lymphatic: No pathologically enlarged lymph nodes. No abdominal aortic aneurysm. Aortic atherosclerosis. Reproductive:  No mass or other significant abnormality. Other:  None. Musculoskeletal:  No suspicious bone lesions identified. IMPRESSION: 1. Small bowel obstruction due to large paraumbilical ventral hernia, which contains multiple small bowel loops. No signs of bowel strangulation. 2. Colonic diverticulosis, without radiographic evidence of diverticulitis. Aortic Atherosclerosis (ICD10-I70.0). Electronically Signed   By: Marlaine Hind M.D.   On: 01/13/2019 18:50   Dg Chest Portable 1  View  Result Date: 01/13/2019 CLINICAL DATA:  NG tube placement EXAM: PORTABLE CHEST 1 VIEW COMPARISON:  Radiograph 01/13/2019 FINDINGS: Interval placement of a transesophageal tube with tip and side port distal to the GE junction, curling within the gastric lumen in the left upper quadrant. Redemonstration of the diffuse interstitial opacities and moderate cardiomegaly. No pneumothorax. No new consolidative process. No acute osseous or soft tissue abnormality. Degenerative changes are present in the imaged spine and shoulders. IMPRESSION: 1. Appropriate positioning of the transesophageal tube with the tip and side port distal to the GE junction 2. Slightly diminished lung volumes with persistent interstitial pulmonary edema and cardiomegaly. Electronically Signed   By: Lovena Le M.D.   On: 01/13/2019 20:15   Dg Chest Portable 1 View  Result Date: 01/13/2019 CLINICAL DATA:  Cough. Nausea and vomiting. Right-sided abdominal pain. EXAM: PORTABLE CHEST 1 VIEW COMPARISON:  09/25/2010 FINDINGS: Mild-to-moderate cardiomegaly. Diffuse interstitial infiltrates, suspicious for interstitial edema. Stable linear opacity in right lower lung, consistent with scarring. No evidence of pulmonary consolidation or pleural effusion. IMPRESSION: Cardiomegaly and diffuse interstitial infiltrates, suspicious for mild congestive heart failure. Electronically Signed   By: Marlaine Hind M.D.   On: 01/13/2019 16:41    ROS:  Pertinent items are noted in HPI.  Blood pressure (!) 146/74, pulse 62, temperature 98.5 F (36.9 C), resp. rate 18, SpO2 91 %. Physical Exam: Pleasant morbidly obese white female no acute distress. Head is normocephalic, atraumatic Lungs are clear to auscultation with your breath sounds bilaterally Heart examination reveals a regular rate and rhythm without S3, S4, murmurs Abdomen is large with an obvious periumbilical hernia, extending down into the right pannus.  I did not attempt to reduce it.  Her  abdomen is soft.  No rigidity is noted.  CT scan images personally reviewed  Assessment/Plan: Impression: Chronic incisional hernia with new onset bowel obstruction, COPD, morbid obesity Plan: Patient is scheduled for incisional herniorrhaphy with mesh tomorrow.  The risks and benefits of the procedure including bleeding, infection, cardiopulmonary difficulties, the possibility of a bowel resection, and the possibility of a recurrence of the hernia were fully explained to the patient, who gave informed consent.  [name] 01/14/2019, 9:22 AM

## 2019-01-14 NOTE — Progress Notes (Signed)
Offered to place humidity on oxygen but pt declined

## 2019-01-15 ENCOUNTER — Inpatient Hospital Stay (HOSPITAL_COMMUNITY): Payer: Self-pay | Admitting: Anesthesiology

## 2019-01-15 ENCOUNTER — Encounter (HOSPITAL_COMMUNITY)
Admission: EM | Disposition: A | Discharge status: Home Health | Payer: Self-pay | Source: Home / Self Care | Attending: Internal Medicine

## 2019-01-15 ENCOUNTER — Encounter (HOSPITAL_COMMUNITY): Payer: Self-pay | Admitting: *Deleted

## 2019-01-15 DIAGNOSIS — K432 Incisional hernia without obstruction or gangrene: Secondary | ICD-10-CM

## 2019-01-15 HISTORY — PX: INCISIONAL HERNIA REPAIR: SHX193

## 2019-01-15 HISTORY — PX: OMENTECTOMY: SHX5985

## 2019-01-15 LAB — BASIC METABOLIC PANEL
Anion gap: 11 (ref 5–15)
BUN: 21 mg/dL — ABNORMAL HIGH (ref 6–20)
CO2: 26 mmol/L (ref 22–32)
Calcium: 9.1 mg/dL (ref 8.9–10.3)
Chloride: 102 mmol/L (ref 98–111)
Creatinine, Ser: 0.93 mg/dL (ref 0.44–1.00)
GFR calc Af Amer: >60 mL/min (ref >60)
GFR calc non Af Amer: >60 mL/min (ref >60)
Glucose, Bld: 162 mg/dL — ABNORMAL HIGH (ref 70–99)
Potassium: 4.7 mmol/L (ref 3.5–5.1)
Sodium: 139 mmol/L (ref 135–145)

## 2019-01-15 LAB — CBC
HCT: 51.6 % — ABNORMAL HIGH (ref 36.0–46.0)
Hemoglobin: 16.4 g/dL — ABNORMAL HIGH (ref 12.0–15.0)
MCH: 32.7 pg (ref 26.0–34.0)
MCHC: 31.8 g/dL (ref 30.0–36.0)
MCV: 102.8 fL — ABNORMAL HIGH (ref 80.0–100.0)
Platelets: 156 10*3/uL (ref 150–400)
RBC: 5.02 MIL/uL (ref 3.87–5.11)
RDW: 14 % (ref 11.5–15.5)
WBC: 14.7 10*3/uL — ABNORMAL HIGH (ref 4.0–10.5)
nRBC: 0 % (ref 0.0–0.2)

## 2019-01-15 LAB — FOLATE: Folate: 4.3 ng/mL — ABNORMAL LOW (ref >5.9)

## 2019-01-15 LAB — MAGNESIUM: Magnesium: 2.4 mg/dL (ref 1.7–2.4)

## 2019-01-15 LAB — HIV ANTIBODY (ROUTINE TESTING W REFLEX): HIV Screen 4th Generation wRfx: NONREACTIVE

## 2019-01-15 LAB — MRSA PCR SCREENING: MRSA by PCR: NEGATIVE

## 2019-01-15 LAB — PROCALCITONIN: Procalcitonin: <0.1 ng/mL

## 2019-01-15 LAB — VITAMIN B12: Vitamin B-12: 113 pg/mL — ABNORMAL LOW (ref 180–914)

## 2019-01-15 SURGERY — REPAIR, HERNIA, INCISIONAL
Anesthesia: General | Site: Abdomen

## 2019-01-15 MED ORDER — SIMETHICONE 80 MG PO CHEW
40.0000 mg | CHEWABLE_TABLET | Freq: Four times a day (QID) | ORAL | Status: DC | PRN
  Administered 2019-01-30: 40 mg via ORAL
  Filled 2019-01-15: qty 1

## 2019-01-15 MED ORDER — MEPERIDINE HCL 50 MG/ML IJ SOLN: 6.2500 mg | INTRAMUSCULAR | Status: DC | PRN

## 2019-01-15 MED ORDER — GLYCOPYRROLATE 0.2 MG/ML IJ SOLN
INTRAMUSCULAR | Status: DC | PRN
  Administered 2019-01-15: 0.2 mg via INTRAVENOUS

## 2019-01-15 MED ORDER — KETOROLAC TROMETHAMINE 30 MG/ML IJ SOLN
30.0000 mg | Freq: Four times a day (QID) | INTRAMUSCULAR | Status: AC
  Administered 2019-01-15: 14:00:00 30 mg via INTRAVENOUS
  Filled 2019-01-15: qty 1

## 2019-01-15 MED ORDER — DIPHENHYDRAMINE HCL 50 MG/ML IJ SOLN
25.0000 mg | Freq: Four times a day (QID) | INTRAMUSCULAR | Status: DC | PRN
  Administered 2019-01-22 – 2019-01-26 (×2): 25 mg via INTRAVENOUS
  Filled 2019-01-15 (×2): qty 1

## 2019-01-15 MED ORDER — LORAZEPAM 2 MG/ML IJ SOLN: 1.0000 mg | INTRAMUSCULAR | Status: DC | PRN

## 2019-01-15 MED ORDER — ONDANSETRON HCL 4 MG/2ML IJ SOLN
INTRAMUSCULAR | Status: DC | PRN
  Administered 2019-01-15: 4 mg via INTRAVENOUS

## 2019-01-15 MED ORDER — MIDAZOLAM HCL 2 MG/2ML IJ SOLN
INTRAMUSCULAR | Status: AC
  Filled 2019-01-15: qty 2

## 2019-01-15 MED ORDER — FENTANYL CITRATE (PF) 100 MCG/2ML IJ SOLN
INTRAMUSCULAR | Status: DC | PRN
  Administered 2019-01-15 (×3): 50 ug via INTRAVENOUS
  Administered 2019-01-15: 150 ug via INTRAVENOUS
  Administered 2019-01-15: 50 ug via INTRAVENOUS

## 2019-01-15 MED ORDER — DEXAMETHASONE SODIUM PHOSPHATE 10 MG/ML IJ SOLN
INTRAMUSCULAR | Status: DC | PRN
  Administered 2019-01-15: 10 mg via INTRAVENOUS

## 2019-01-15 MED ORDER — LACTATED RINGERS IV SOLN
Freq: Once | INTRAVENOUS | Status: AC
  Administered 2019-01-15: 10:00:00 1000 mL via INTRAVENOUS

## 2019-01-15 MED ORDER — ENOXAPARIN SODIUM 40 MG/0.4ML ~~LOC~~ SOLN
40.0000 mg | Freq: Once | SUBCUTANEOUS | Status: AC
  Administered 2019-01-15: 40 mg via SUBCUTANEOUS
  Filled 2019-01-15: qty 0.4

## 2019-01-15 MED ORDER — BUPIVACAINE LIPOSOME 1.3 % IJ SUSP
INTRAMUSCULAR | Status: DC | PRN
  Administered 2019-01-15: 20 mL

## 2019-01-15 MED ORDER — FENTANYL CITRATE (PF) 100 MCG/2ML IJ SOLN
INTRAMUSCULAR | Status: AC
  Filled 2019-01-15: qty 2

## 2019-01-15 MED ORDER — SUGAMMADEX SODIUM 200 MG/2ML IV SOLN
INTRAVENOUS | Status: DC | PRN
  Administered 2019-01-15: 300 mg via INTRAVENOUS

## 2019-01-15 MED ORDER — TRAMADOL HCL 50 MG PO TABS
50.0000 mg | ORAL_TABLET | Freq: Four times a day (QID) | ORAL | Status: DC | PRN
  Administered 2019-01-18 – 2019-01-27 (×5): 50 mg via ORAL
  Filled 2019-01-15 (×5): qty 1

## 2019-01-15 MED ORDER — 0.9 % SODIUM CHLORIDE (POUR BTL) OPTIME
TOPICAL | Status: DC | PRN
  Administered 2019-01-15: 1000 mL

## 2019-01-15 MED ORDER — LIDOCAINE HCL (CARDIAC) PF 50 MG/5ML IV SOSY
PREFILLED_SYRINGE | INTRAVENOUS | Status: DC | PRN
  Administered 2019-01-15: 100 mg via INTRAVENOUS

## 2019-01-15 MED ORDER — CHLORHEXIDINE GLUCONATE CLOTH 2 % EX PADS
6.0000 | MEDICATED_PAD | Freq: Every day | CUTANEOUS | Status: DC
  Administered 2019-01-15 – 2019-01-30 (×15): 6 via TOPICAL

## 2019-01-15 MED ORDER — ONDANSETRON HCL 4 MG/2ML IJ SOLN
INTRAMUSCULAR | Status: AC
  Filled 2019-01-15: qty 2

## 2019-01-15 MED ORDER — PROPOFOL 10 MG/ML IV BOLUS
INTRAVENOUS | Status: AC
  Filled 2019-01-15: qty 20

## 2019-01-15 MED ORDER — SUCCINYLCHOLINE 20MG/ML (10ML) SYRINGE FOR MEDFUSION PUMP - OPTIME
INTRAMUSCULAR | Status: DC | PRN
  Administered 2019-01-15: 140 mg via INTRAVENOUS

## 2019-01-15 MED ORDER — FENTANYL CITRATE (PF) 250 MCG/5ML IJ SOLN
INTRAMUSCULAR | Status: AC
  Filled 2019-01-15: qty 5

## 2019-01-15 MED ORDER — KETOROLAC TROMETHAMINE 30 MG/ML IJ SOLN: 30.0000 mg | Freq: Four times a day (QID) | INTRAMUSCULAR | Status: DC | PRN

## 2019-01-15 MED ORDER — LACTATED RINGERS IV SOLN
Freq: Once | INTRAVENOUS | Status: AC
  Administered 2019-01-15: 1000 mL via INTRAVENOUS

## 2019-01-15 MED ORDER — VANCOMYCIN HCL 10 G IV SOLR
1500.0000 mg | INTRAVENOUS | Status: AC
  Administered 2019-01-15: 10:00:00 1500 mg via INTRAVENOUS
  Filled 2019-01-15: qty 1500

## 2019-01-15 MED ORDER — PROPOFOL 10 MG/ML IV BOLUS
INTRAVENOUS | Status: DC | PRN
  Administered 2019-01-15: 150 mg via INTRAVENOUS

## 2019-01-15 MED ORDER — POLYVINYL ALCOHOL 1.4 % OP SOLN
1.0000 [drp] | OPHTHALMIC | Status: DC | PRN
  Administered 2019-01-15: 1 [drp] via OPHTHALMIC
  Filled 2019-01-15: qty 15

## 2019-01-15 MED ORDER — ROCURONIUM 10MG/ML (10ML) SYRINGE FOR MEDFUSION PUMP - OPTIME
INTRAVENOUS | Status: DC | PRN
  Administered 2019-01-15: 50 mg via INTRAVENOUS
  Administered 2019-01-15 (×2): 10 mg via INTRAVENOUS

## 2019-01-15 MED ORDER — HYDROMORPHONE HCL 1 MG/ML IJ SOLN
0.5000 mg | INTRAMUSCULAR | Status: DC | PRN
  Administered 2019-01-15 (×2): 0.5 mg via INTRAVENOUS
  Filled 2019-01-15 (×2): qty 0.5

## 2019-01-15 MED ORDER — ENOXAPARIN SODIUM 40 MG/0.4ML ~~LOC~~ SOLN
40.0000 mg | SUBCUTANEOUS | Status: DC
  Administered 2019-01-16 – 2019-01-28 (×13): 40 mg via SUBCUTANEOUS
  Filled 2019-01-15 (×13): qty 0.4

## 2019-01-15 MED ORDER — POVIDONE-IODINE 10 % EX OINT
TOPICAL_OINTMENT | CUTANEOUS | Status: DC | PRN
  Administered 2019-01-15: 1 via TOPICAL

## 2019-01-15 MED ORDER — DIPHENHYDRAMINE HCL 25 MG PO CAPS
25.0000 mg | ORAL_CAPSULE | Freq: Four times a day (QID) | ORAL | Status: DC | PRN
  Administered 2019-01-20 – 2019-01-21 (×2): 25 mg via ORAL
  Filled 2019-01-15 (×2): qty 1

## 2019-01-15 MED ORDER — LACTATED RINGERS IV SOLN
INTRAVENOUS | Status: DC | PRN
  Administered 2019-01-15 (×3): via INTRAVENOUS

## 2019-01-15 MED ORDER — SODIUM CHLORIDE 0.9% FLUSH
INTRAVENOUS | Status: AC
  Filled 2019-01-15: qty 30

## 2019-01-15 SURGICAL SUPPLY — 45 items
APL PRP STRL LF DISP 70% ISPRP (MISCELLANEOUS) ×2
CHLORAPREP W/TINT 26 (MISCELLANEOUS) ×4 IMPLANT
CLOTH BEACON ORANGE TIMEOUT ST (SAFETY) ×4 IMPLANT
COVER LIGHT HANDLE STERIS (MISCELLANEOUS) ×8 IMPLANT
COVER WAND RF STERILE (DRAPES) ×4 IMPLANT
DRSG OPSITE POSTOP 4X10 (GAUZE/BANDAGES/DRESSINGS) ×2 IMPLANT
ELECT REM PT RETURN 9FT ADLT (ELECTROSURGICAL) ×4
ELECTRODE REM PT RTRN 9FT ADLT (ELECTROSURGICAL) ×2 IMPLANT
EVACUATOR DRAINAGE 10X20 100CC (DRAIN) IMPLANT
EVACUATOR SILICONE 100CC (DRAIN) ×4
GLOVE BIO SURGEON STRL SZ7 (GLOVE) ×4 IMPLANT
GLOVE BIO SURGEON STRL SZ8 (GLOVE) ×4 IMPLANT
GLOVE BIOGEL PI IND STRL 7.0 (GLOVE) ×4 IMPLANT
GLOVE BIOGEL PI IND STRL 7.5 (GLOVE) IMPLANT
GLOVE BIOGEL PI INDICATOR 7.0 (GLOVE) ×8
GLOVE BIOGEL PI INDICATOR 7.5 (GLOVE) ×4
GLOVE ECLIPSE 6.5 STRL STRAW (GLOVE) ×2 IMPLANT
GLOVE SURG SS PI 7.5 STRL IVOR (GLOVE) ×4 IMPLANT
GOWN STRL REUS W/ TWL LRG LVL3 (GOWN DISPOSABLE) IMPLANT
GOWN STRL REUS W/TWL LRG LVL3 (GOWN DISPOSABLE) ×20 IMPLANT
INST SET MAJOR GENERAL (KITS) ×2 IMPLANT
KIT TURNOVER KIT A (KITS) ×4 IMPLANT
LIGASURE IMPACT 36 18CM CVD LR (INSTRUMENTS) ×2 IMPLANT
MANIFOLD NEPTUNE II (INSTRUMENTS) ×4 IMPLANT
MESH VENTRALIGHT ST 4X6IN (Mesh General) ×2 IMPLANT
NDL HYPO 18GX1.5 BLUNT FILL (NEEDLE) IMPLANT
NDL HYPO 21X1.5 SAFETY (NEEDLE) ×2 IMPLANT
NEEDLE HYPO 18GX1.5 BLUNT FILL (NEEDLE) ×4 IMPLANT
NEEDLE HYPO 21X1.5 SAFETY (NEEDLE) ×4 IMPLANT
NS IRRIG 1000ML POUR BTL (IV SOLUTION) ×4 IMPLANT
PACK ABDOMINAL MAJOR (CUSTOM PROCEDURE TRAY) ×2 IMPLANT
PAD ARMBOARD 7.5X6 YLW CONV (MISCELLANEOUS) ×4 IMPLANT
SET BASIN LINEN APH (SET/KITS/TRAYS/PACK) ×4 IMPLANT
SPONGE DRAIN TRACH 4X4 STRL 2S (GAUZE/BANDAGES/DRESSINGS) ×2 IMPLANT
SPONGE LAP 18X18 RF (DISPOSABLE) ×6 IMPLANT
STAPLER VISISTAT (STAPLE) ×2 IMPLANT
SUT ETHILON 3 0 FSL (SUTURE) ×2 IMPLANT
SUT PROLENE 0 CT 1 CR/8 (SUTURE) ×2 IMPLANT
SUT VIC AB 2-0 CT1 27 (SUTURE) ×8
SUT VIC AB 2-0 CT1 TAPERPNT 27 (SUTURE) IMPLANT
SYR 20ML LL LF (SYRINGE) ×8 IMPLANT
SYR 30ML LL (SYRINGE) ×2 IMPLANT
SYR BULB IRRIGATION 50ML (SYRINGE) ×2 IMPLANT
TACKER 5MM HERNIA 3.5CML NAB (ENDOMECHANICALS) ×2 IMPLANT
TAPE CLOTH SURG 4X10 WHT LF (GAUZE/BANDAGES/DRESSINGS) ×2 IMPLANT

## 2019-01-15 NOTE — Transfer of Care (Signed)
Immediate Anesthesia Transfer of Care Note  Patient: Veronica Horton  Procedure(s) Performed: [facility] WITH MESH (N/A Abdomen) OMENTECTOMY (N/A )  Patient Location: PACU  Anesthesia Type:General  Level of Consciousness: awake and alert   Airway & Oxygen Therapy: Patient Spontanous Breathing  Post-op Assessment: Report given to RN  Post vital signs: Reviewed and stable  Last Vitals:  Vitals Value Taken Time  BP 200/69 01/15/19 1246  Temp    Pulse 59 01/15/19 1252  Resp 15 01/15/19 1252  SpO2 90 % 01/15/19 1252  Vitals shown include unvalidated device data.  Last Pain:  Vitals:   01/15/19 0909  TempSrc: Oral  PainSc: 0-No pain      Patients Stated Pain Goal: 6 (01/28/56 4734)  Complications: No apparent anesthesia complications

## 2019-01-15 NOTE — Progress Notes (Signed)
PROGRESS NOTE  Veronica Horton XIH:[id number] DOB: 1960/08/08 DOA: 01/13/2019 PCP: Sharilyn Sites, MD  Brief History:  58 y/o female with history of COPD, HTN, tobacco abuse, morbid obesity presenting with abdominal pain with nausea and vomiting that started evening 01/12/19 and progressively worsened throughout the day on 01/13/19.  She also has had increasing cough with clear sputum x 1-2 days.  She denies f/c, cp, hemoptysis, dysuria, hematuria, hematochezia, melena.  She had some dyspnea on exertion, but stated it was not any worse than usual.  Her last BM was on 01/12/19.  She denies any new medications, but continues to smoke 1ppd.   In the ED, she was afebrile and hemodynamically stable.  BMP, LFTs and CBC were essentially unremarkable except some polycythemia and WBC 11.3.  The patient was afebrile hemodynamically stable but was hypoxic with oxygen saturation 87% on room air.  CT of abd/pelvis showed SBO secondary to a large paraumbilical ventral hernia without strangulation.  NG was placed for decompression.  General surgery was consulted to assist.  Assessment/Plan: SBO -General surgery consult appreciated--case discussed with Dr. Arnoldo Morale planning for surgery 01/15/2019  Acute respiratory failure with hypoxia -Secondary to COPD exacerbation with a component of aspiration pneumonitis -Stable on 4 L presently -Wean oxygen for saturation greater 92%  COPD exacerbation -Started Pulmicort -Start duo nebs -Given IV solumedrol with good results  Essential hypertension -Restart lisinopril/HCTZ once able to tolerate po -Hydralazine as needed SBP>180  Peripheral neuropathy -Restart gabapentin once able to tolerate po -Serum L49 -Folic acid  Disposition Plan:   Home when medically/surgically cleared  Family Communication:  Patient updated at bedside  Consultants:  General surgery  Code Status:  FULL   DVT Prophylaxis:  [place] Heparin   Procedures: As Listed in Progress  Note Above  Antibiotics: None  Subjective: Patient seen just prior to surgery this morning. No specific complaints.   Objective: Vitals:   01/15/19 0504 01/15/19 0756 01/15/19 0801 01/15/19 0909  BP: (!) 120/53   (!) 151/81  Pulse: (!) 57   60  Resp: 20   (!) 22  Temp: 98 F (36.7 C)   97.8 F (36.6 C)  TempSrc: Oral   Oral  SpO2: (!) 88% 90% 91% (!) 88%  Weight:    127 kg  Height:    5\' 5"  (1.651 m)    Intake/Output Summary (Last 24 hours) at 01/15/2019 1259 Last data filed at 01/15/2019 1252 Gross per 24 hour  Intake 5008.53 ml  Output 450 ml  Net 4558.53 ml   Weight change:  Exam:   General:  Pt is alert, follows commands appropriately, not in acute distress  HEENT: No icterus, No thrush, No neck mass, [place]/AT  Cardiovascular: normal S1/S2, no rubs, no gallops  Respiratory: Bilateral rhonchi.  Bilateral speech or wheeze peer good air movement.  Abdomen: Soft/+BS,  no guarding  Extremities: 1 + LE edema, No lymphangitis, No petechiae, No rashes, no synovitis  Data Reviewed: I have personally reviewed following labs and imaging studies Basic Metabolic Panel: Recent Labs  Lab 01/13/19 1640 01/14/19 0447 01/15/19 0443  NA 138 143 139  K 4.0 4.1 4.7  CL 98 104 102  CO2 30 30 26   GLUCOSE 131* 95 162*  BUN 16 18 21*  CREATININE 0.85 0.97 0.93  CALCIUM 9.1 9.0 9.1  MG  --   --  2.4   Liver Function Tests: Recent Labs  Lab 01/13/19 1640  AST 18  ALT 16  ALKPHOS 83  BILITOT 1.1  PROT 8.2*  ALBUMIN 4.3   Recent Labs  Lab 01/13/19 1640  LIPASE 23   No results for input(s): AMMONIA in the last 168 hours. Coagulation Profile: No results for input(s): INR, PROTIME in the last 168 hours. CBC: Recent Labs  Lab 01/13/19 1640 01/14/19 0447 01/15/19 0443  WBC 11.3* 10.3 14.7*  NEUTROABS 10.1* 7.3  --   HGB 18.1* 16.9* 16.4*  HCT 56.8* 54.0* 51.6*  MCV 100.4* 103.1* 102.8*  PLT 226 233 156   Cardiac Enzymes: No results for input(s): CKTOTAL,  CKMB, CKMBINDEX, TROPONINI in the last 168 hours. BNP: Invalid input(s): POCBNP CBG: No results for input(s): GLUCAP in the last 168 hours. HbA1C: No results for input(s): HGBA1C in the last 72 hours. Urine analysis:    Component Value Date/Time   COLORURINE YELLOW 01/13/2019 1730   APPEARANCEUR CLOUDY (A) 01/13/2019 1730   LABSPEC 1.017 01/13/2019 1730   PHURINE 8.0 01/13/2019 1730   GLUCOSEU NEGATIVE 01/13/2019 1730   HGBUR NEGATIVE 01/13/2019 1730   BILIRUBINUR NEGATIVE 01/13/2019 1730   KETONESUR NEGATIVE 01/13/2019 1730   PROTEINUR 100 (A) 01/13/2019 1730   UROBILINOGEN 0.2 09/25/2010 0530   NITRITE NEGATIVE 01/13/2019 1730   LEUKOCYTESUR NEGATIVE 01/13/2019 1730    Recent Results (from the past 240 hour(s))  SARS Coronavirus 2 [facility] order, Performed in [facility] hospital lab) Nasopharyngeal Nasopharyngeal Swab     Status: None   Collection Time: 01/13/19  3:59 PM   Specimen: Nasopharyngeal Swab  Result Value Ref Range Status   SARS Coronavirus 2 NEGATIVE NEGATIVE Final    Comment: (NOTE) If result is NEGATIVE SARS-CoV-2 target nucleic acids are NOT DETECTED. The SARS-CoV-2 RNA is generally detectable in upper and lower  respiratory specimens during the acute phase of infection. The lowest  concentration of SARS-CoV-2 viral copies this assay can detect is 250  copies / mL. A negative result does not preclude SARS-CoV-2 infection  and should not be used as the sole basis for treatment or other  patient management decisions.  A negative result may occur with  improper specimen collection / handling, submission of specimen other  than nasopharyngeal swab, presence of viral mutation(s) within the  areas targeted by this assay, and inadequate number of viral copies  (<250 copies / mL). A negative result must be combined with clinical  observations, patient history, and epidemiological information. If result is POSITIVE SARS-CoV-2 target nucleic acids are  DETECTED. The SARS-CoV-2 RNA is generally detectable in upper and lower  respiratory specimens dur ing the acute phase of infection.  Positive  results are indicative of active infection with SARS-CoV-2.  Clinical  correlation with patient history and other diagnostic information is  necessary to determine patient infection status.  Positive results do  not rule out bacterial infection or co-infection with other viruses. If result is PRESUMPTIVE POSTIVE SARS-CoV-2 nucleic acids MAY BE PRESENT.   A presumptive positive result was obtained on the submitted specimen  and confirmed on repeat testing.  While 2019 novel coronavirus  (SARS-CoV-2) nucleic acids may be present in the submitted sample  additional confirmatory testing may be necessary for epidemiological  and / or clinical management purposes  to differentiate between  SARS-CoV-2 and other Sarbecovirus currently known to infect humans.  If clinically indicated additional testing with an alternate test  methodology [phone]) is advised. The SARS-CoV-2 RNA is generally  detectable in upper and lower respiratory sp ecimens during the acute  phase  of infection. The expected result is Negative. Fact Sheet for Patients:  StrictlyIdeas.no Fact Sheet for Healthcare Providers: BankingDealers.co.za This test is not yet approved or cleared by the [place] FDA and has been authorized for detection and/or diagnosis of SARS-CoV-2 by FDA under an Emergency Use Authorization (EUA).  This EUA will remain in effect (meaning this test can be used) for the duration of the COVID-19 declaration under Section 564(b)(1) of the Act, 21 U.S.C. section 360bbb-3(b)(1), unless the authorization is terminated or revoked sooner. Performed at [facility], [address] [place], [place] [address]   Surgical pcr screen     Status: None   Collection Time: 01/14/19  7:41 PM   Specimen: Nasal Mucosa; Nasal  Swab  Result Value Ref Range Status   MRSA, PCR NEGATIVE NEGATIVE Final   Staphylococcus aureus NEGATIVE NEGATIVE Final    Comment: (NOTE) The Xpert SA Assay (FDA approved for NASAL specimens in patients 57 years of age and older), is one component of a comprehensive surveillance program. It is not intended to diagnose infection nor to guide or monitor treatment. Performed at [facility], [address], [place], [place] [address]      Scheduled Meds: . [MAR Hold] budesonide (PULMICORT) nebulizer solution  0.5 mg Nebulization BID  . Chlorhexidine Gluconate Cloth  6 each Topical Once  . [MAR Hold] ipratropium-albuterol  3 mL Nebulization Q6H  . [MAR Hold] mouth rinse  15 mL Mouth Rinse BID  . [MAR Hold] methylPREDNISolone (SOLU-MEDROL) injection  60 mg Intravenous Q8H  . [MAR Hold] sodium chloride flush  3 mL Intravenous Q12H   Continuous Infusions: . [MAR Hold] sodium chloride    . dextrose 5 % and 0.45 % NaCl with KCl 20 mEq/L Stopped (01/15/19 0930)    Procedures/Studies: Ct Abdomen Pelvis W Contrast  Result Date: 01/13/2019 CLINICAL DATA:  Right-sided abdominal pain and nausea and vomiting beginning last night. Prior cholecystectomy. EXAM: CT ABDOMEN AND PELVIS WITH CONTRAST TECHNIQUE: Multidetector CT imaging of the abdomen and pelvis was performed using the standard protocol following bolus administration of intravenous contrast. CONTRAST:  175mL OMNIPAQUE IOHEXOL 300 MG/ML  SOLN COMPARISON:  05/14/2012 FINDINGS: Lower Chest: Increased linear opacity in both lung bases since 2014, which may be due to subsegmental atelectasis or scarring. Hepatobiliary: No hepatic masses identified. Prior cholecystectomy. No evidence of biliary obstruction. Pancreas:  No mass or inflammatory changes. Spleen: Within normal limits in size and appearance. Adrenals/Urinary Tract: No masses identified. Several small left renal cysts again noted. No evidence of hydronephrosis. Stomach/Bowel: Large  paraumbilical ventral hernia is again seen containing multiple small bowel loops. Moderate dilatation of small bowel is seen within the hernia and proximal to the hernia, consistent with small-bowel obstruction. No evidence of small bowel wall thickening or pneumatosis. No evidence of inflammatory process or abnormal fluid collections. Diverticulosis is seen mainly involving the descending and sigmoid colon, however there is no evidence of diverticulitis. Vascular/Lymphatic: No pathologically enlarged lymph nodes. No abdominal aortic aneurysm. Aortic atherosclerosis. Reproductive:  No mass or other significant abnormality. Other:  None. Musculoskeletal:  No suspicious bone lesions identified. IMPRESSION: 1. Small bowel obstruction due to large paraumbilical ventral hernia, which contains multiple small bowel loops. No signs of bowel strangulation. 2. Colonic diverticulosis, without radiographic evidence of diverticulitis. Aortic Atherosclerosis (ICD10-I70.0). Electronically Signed   By: Marlaine Hind M.D.   On: 01/13/2019 18:50   Dg Chest Portable 1 View  Result Date: 01/13/2019 CLINICAL DATA:  NG tube placement EXAM: PORTABLE CHEST 1  VIEW COMPARISON:  Radiograph 01/13/2019 FINDINGS: Interval placement of a transesophageal tube with tip and side port distal to the GE junction, curling within the gastric lumen in the left upper quadrant. Redemonstration of the diffuse interstitial opacities and moderate cardiomegaly. No pneumothorax. No new consolidative process. No acute osseous or soft tissue abnormality. Degenerative changes are present in the imaged spine and shoulders. IMPRESSION: 1. Appropriate positioning of the transesophageal tube with the tip and side port distal to the GE junction 2. Slightly diminished lung volumes with persistent interstitial pulmonary edema and cardiomegaly. Electronically Signed   By: Lovena Le M.D.   On: 01/13/2019 20:15   Dg Chest Portable 1 View  Result Date:  01/13/2019 CLINICAL DATA:  Cough. Nausea and vomiting. Right-sided abdominal pain. EXAM: PORTABLE CHEST 1 VIEW COMPARISON:  09/25/2010 FINDINGS: Mild-to-moderate cardiomegaly. Diffuse interstitial infiltrates, suspicious for interstitial edema. Stable linear opacity in right lower lung, consistent with scarring. No evidence of pulmonary consolidation or pleural effusion. IMPRESSION: Cardiomegaly and diffuse interstitial infiltrates, suspicious for mild congestive heart failure. Electronically Signed   By: Marlaine Hind M.D.   On: 01/13/2019 16:41   Raheen Capili Wynetta Emery, MD [facility] How to contact the [facility] Attending or Consulting provider [place] or covering provider during after hours [place], for this patient?  1. Check the care team in [facility] and look for a) attending/consulting [facility] provider listed and b) the [facility] team listed 2. Log into [email or web address] and use [place]'s universal password to access. If you do not have the password, please contact the hospital operator. 3. Locate the [facility] provider you are looking for under [facility] and page to a number that you can be directly reached. 4. If you still have difficulty reaching the provider, please page the [facility] (Director on Call) for the Hospitalists listed on amion for assistance.   If 7PM-7AM, please contact night-coverage [email or web address] Password [email or web address] 01/15/2019, 12:59 PM   LOS: 1 day

## 2019-01-15 NOTE — Anesthesia Preprocedure Evaluation (Addendum)
Anesthesia Evaluation  Patient identified by MRN, date of birth, ID band Patient awake    Reviewed: Allergy & Precautions, NPO status , Patient's Chart, lab work & pertinent test results  Airway Mallampati: III  TM Distance: >3 FB Neck ROM: Full    Dental  (+) Poor Dentition, Missing, Dental Advisory Given, Chipped   Pulmonary COPD,  COPD inhaler, Current Smoker and Patient abstained from smoking.,  IMPRESSION: 1. Appropriate positioning of the transesophageal tube with the tip and side port distal to the GE junction 2. Slightly diminished lung volumes with persistent interstitial pulmonary edema and cardiomegaly. Electronically Signed   By: Lovena Le M.D.   On: 01/13/2019 20:15 Patient's SpO2 on nasal cannula 3l/mt is 83 to 85% today, bilateral wheezing, duoneb nebulizer treatment was given, SpO2 levels improved to 93-95%  Patient's SpO2 on nasal cannula 3l/mt is 83 to 85% today, bilateral wheezing, duoneb nebulizer treatment was given, SpO2 levels improved to 93-95%    + wheezing      Cardiovascular Exercise Tolerance: Poor hypertension, Pt. on medications Normal cardiovascular exam Rhythm:Regular Rate:Normal  EKG - Nonspecific T wave   Neuro/Psych  Neuromuscular disease (restless leg syndrome) negative psych ROS   GI/Hepatic Neg liver ROS, hiatal hernia, Small bowel obstruction, nausea, vomiting, abdominal pain, NGT was placed yesterday came out this morning.   Endo/Other  Morbid obesity  Renal/GU negative Renal ROS     Musculoskeletal  (+) Arthritis ,   Abdominal (+) + obese,  Abdomen: tender.    Peds  Hematology  (+) Blood dyscrasia (polycythemia), ,   Anesthesia Other Findings ED Course: Upon arrival to the ED, patient is found to be afebrile, saturating mid 80s on room air, and with remaining vitals stable.  Chemistry panel is notable for a protein of 8.2 and CBC features a leukocytosis to 11,300 and  a polycythemia with hemoglobin 18.1.  BNP and lipase were normal and urinalysis with 100 protein.  Chest x-ray features cardiomegaly with diffuse interstitial infiltrates concerning for mild CHF.  CT abdomen and pelvis is notable for small bowel obstruction due to a large periumbilical ventral hernia containing multiple small bowel loops but without signs of strangulation.  Patient was treated with fentanyl, DuoNeb's, Zofran, and NG tube is been placed.  Surgery was consulted by the ED physician and recommends medical admission.  Reproductive/Obstetrics                           Anesthesia Physical Anesthesia Plan  ASA: IV  Anesthesia Plan: General   Post-op Pain Management:    Induction: Intravenous  PONV Risk Score and Plan: 4 or greater and Ondansetron, Dexamethasone and Promethazine  Airway Management Planned: Oral ETT  Additional Equipment: Arterial line  Intra-op Plan:   Post-operative Plan: Extubation in OR and Possible Post-op intubation/ventilation  Informed Consent: I have reviewed the patients History and Physical, chart, labs and discussed the procedure including the risks, benefits and alternatives for the proposed anesthesia with the patient or authorized representative who has indicated his/her understanding and acceptance.     Dental advisory given  Plan Discussed with: CRNA  Anesthesia Plan Comments:        Anesthesia Quick Evaluation

## 2019-01-15 NOTE — Op Note (Signed)
Patient:  Veronica Horton  DOB:  Dec 08, 1960  MRN:  [id number]   Preop Diagnosis: Incarcerated incisional hernia, small bowel obstruction  Postop Diagnosis: Same  Procedure: Incisional herniorrhaphy with mesh, omentectomy  Surgeon: [name], MD  Anes: General endotracheal  Indications: Patient is a 58 year old morbidly obese white female with a known incisional hernia just above her umbilicus who presents with a small bowel obstruction and incarceration of the large incisional hernia.  The risks and benefits of the procedure including bleeding, infection, cardiopulmonary difficulties, mesh use, the possibility of a bowel resection, and the possibility of recurrence of the hernia were fully explained to the patient, who gave informed consent.  Procedure note: The patient was placed in the supine position.  After induction of general endotracheal anesthesia, the abdomen was prepped and draped using usual sterile technique with ChloraPrep.  Surgical site confirmation was performed.  An incision was made from just above the umbilicus to the right of the umbilicus.  The skin was very thin and the large hernia was immediately seen.  I tried to freed away the subcutaneous tissue from the hernia sac down to the fascia.  Ultimately, I did enter the peritoneal cavity in order to free away adhesions of small bowel and omentum to the hernia sac.  Septated fascia was noted at the base, resulting in multiple hernias within the hernia sac.  This was all lysed and to facilitate reduction of the hernia contents, and omentectomy was performed using the LigaSure.  The specimen was sent to pathology further examination.  The bowel was inspected and no injury was noted.  The surrounding fascia was palpated and no other defects were noted.  The small bowel was returned into the abdominal cavity in an orderly fashion.  The resultant defect measured approximately 5 to 6 cm in its greatest diameter transversely.  A 10  x 15 cm Bard ventralight dual composite mesh that was then inserted and tacked to the fascial layer using a pro-tacker.  The overlying fascia was then reapproximated transversely using 0 Prolene interrupted sutures.  A tension-free repair was found.  The remaining hernia sac was excised and disposed of.  The wound was irrigated with normal saline.  Exparel was instilled into the surrounding wound.  #10 flat Jackson-Pratt drain was placed into the subcutaneous tissue and brought out through a separate stab wound to the right of the incision.  It was secured at the skin level using a 3-0 nylon interrupted suture.  Subcutaneous layer was reapproximated using a 2-0 Vicryl interrupted suture.  The skin was closed using staples.  Betadine ointment and dry sterile dressing were applied.  All tape and needle counts were correct at the end of the procedure.  The patient was extubated in the operating room and transferred to PACU guarded but stable condition due to her COPD.  Complications: None  EBL: 100 cc  Specimen: Omentum  Drains: Jackson-Pratt drain to subcutaneous space

## 2019-01-15 NOTE — Anesthesia Postprocedure Evaluation (Signed)
Anesthesia Post Note  Patient: Veronica Horton  Procedure(s) Performed: [facility] [place] WITH MESH (N/A Abdomen) OMENTECTOMY (N/A )  Patient location during evaluation: PACU Anesthesia Type: General Level of consciousness: awake and alert and oriented Pain management: pain level controlled Vital Signs Assessment: post-procedure vital signs reviewed and stable Respiratory status: spontaneous breathing Cardiovascular status: stable Postop Assessment: no apparent nausea or vomiting Anesthetic complications: no     Last Vitals:  Vitals:   01/15/19 0909 01/15/19 1245  BP: (!) 151/81 (!) 200/69  Pulse: 60   Resp: (!) 22 18  Temp: 36.6 C 37.1 C  SpO2: (!) 88% 91%    Last Pain:  Vitals:   01/15/19 1245  TempSrc:   PainSc: Asleep                 ADAMS, AMY A

## 2019-01-15 NOTE — Progress Notes (Signed)
Bladder scanned patient.  Patient has been voiding, but has voided small amounts each time.  Bladder not retaining urine.  Bladder scan showed less than 50cc of urine. Will continue to monitor patient.

## 2019-01-15 NOTE — Anesthesia Procedure Notes (Signed)
Procedure Name: Intubation Date/Time: 01/15/2019 10:50 AM Performed by: Ollen Bowl, CRNA Pre-anesthesia Checklist: Patient identified, Patient being monitored, Timeout performed, Emergency Drugs available and Suction available Patient Re-evaluated:Patient Re-evaluated prior to induction Oxygen Delivery Method: Circle system utilized Preoxygenation: Pre-oxygenation with 100% oxygen Induction Type: IV induction Ventilation: Mask ventilation without difficulty Laryngoscope Size: Glidescope (S3) Grade View: Grade I Tube type: Oral Tube size: 7.0 mm Number of attempts: 1 Airway Equipment and Method: Rigid stylet Placement Confirmation: ETT inserted through vocal cords under direct vision,  positive ETCO2 and breath sounds checked- equal and bilateral Secured at: 22 cm Tube secured with: Tape Dental Injury: Teeth and Oropharynx as per pre-operative assessment

## 2019-01-15 NOTE — Progress Notes (Signed)
Patient received breathing treatment that caused her to have severe sneezing and coughing, which caused the NG tube come out of patient's nose.  Notified Dr. Arnoldo Morale of NG tube coming out and he stated that was okay and it did not need to be put back in. Patient scheduled for surgery around 1000.

## 2019-01-15 NOTE — Interval H&P Note (Signed)
History and Physical Interval Note:  01/15/2019 9:51 AM  Veronica Horton  has presented today for surgery, with the diagnosis of incisional hernia.  The various methods of treatment have been discussed with the patient and family. After consideration of risks, benefits and other options for treatment, the patient has consented to  Procedure(s): HERNIA REPAIR INCISIONAL (N/A) as a surgical intervention.  The patient's history has been reviewed, patient examined, no change in status, stable for surgery.  I have reviewed the patient's chart and labs.  Questions were answered to the patient's satisfaction.     [name]

## 2019-01-16 ENCOUNTER — Inpatient Hospital Stay (HOSPITAL_COMMUNITY): Payer: Self-pay

## 2019-01-16 ENCOUNTER — Encounter (HOSPITAL_COMMUNITY): Payer: Self-pay | Admitting: General Surgery

## 2019-01-16 DIAGNOSIS — J9601 Acute respiratory failure with hypoxia: Secondary | ICD-10-CM

## 2019-01-16 LAB — ECHOCARDIOGRAM COMPLETE
Height: 65 in
Weight: 4836.01 oz

## 2019-01-16 LAB — CBC
HCT: 48.2 % — ABNORMAL HIGH (ref 36.0–46.0)
Hemoglobin: 15.3 g/dL — ABNORMAL HIGH (ref 12.0–15.0)
MCH: 32.3 pg (ref 26.0–34.0)
MCHC: 31.7 g/dL (ref 30.0–36.0)
MCV: 101.7 fL — ABNORMAL HIGH (ref 80.0–100.0)
Platelets: 228 10*3/uL (ref 150–400)
RBC: 4.74 MIL/uL (ref 3.87–5.11)
RDW: 14.3 % (ref 11.5–15.5)
WBC: 24.4 10*3/uL — ABNORMAL HIGH (ref 4.0–10.5)
nRBC: 0 % (ref 0.0–0.2)

## 2019-01-16 LAB — BASIC METABOLIC PANEL
Anion gap: 7 (ref 5–15)
BUN: 30 mg/dL — ABNORMAL HIGH (ref 6–20)
CO2: 26 mmol/L (ref 22–32)
Calcium: 8.8 mg/dL — ABNORMAL LOW (ref 8.9–10.3)
Chloride: 105 mmol/L (ref 98–111)
Creatinine, Ser: 1.15 mg/dL — ABNORMAL HIGH (ref 0.44–1.00)
GFR calc Af Amer: >60 mL/min (ref >60)
GFR calc non Af Amer: 52 mL/min — ABNORMAL LOW (ref >60)
Glucose, Bld: 128 mg/dL — ABNORMAL HIGH (ref 70–99)
Potassium: 5.1 mmol/L (ref 3.5–5.1)
Sodium: 138 mmol/L (ref 135–145)

## 2019-01-16 LAB — PHOSPHORUS: Phosphorus: 4.5 mg/dL (ref 2.5–4.6)

## 2019-01-16 LAB — MAGNESIUM: Magnesium: 2.4 mg/dL (ref 1.7–2.4)

## 2019-01-16 MED ORDER — IPRATROPIUM-ALBUTEROL 0.5-2.5 (3) MG/3ML IN SOLN
3.0000 mL | RESPIRATORY_TRACT | Status: DC
  Administered 2019-01-16 – 2019-01-30 (×81): 3 mL via RESPIRATORY_TRACT
  Filled 2019-01-16 (×77): qty 3

## 2019-01-16 MED ORDER — FUROSEMIDE 10 MG/ML IJ SOLN
30.0000 mg | Freq: Once | INTRAMUSCULAR | Status: AC
  Administered 2019-01-16: 30 mg via INTRAVENOUS
  Filled 2019-01-16: qty 4

## 2019-01-16 NOTE — Progress Notes (Addendum)
PROGRESS NOTE  Veronica Horton BZM:[id number] DOB: 1960/06/13 DOA: 01/13/2019 PCP: Sharilyn Sites, MD  Brief History:  58 y/o female with history of COPD, HTN, tobacco abuse, morbid obesity presenting with abdominal pain with nausea and vomiting that started evening 01/12/19 and progressively worsened throughout the day on 01/13/19.  She also has had increasing cough with clear sputum x 1-2 days.  She denies f/c, cp, hemoptysis, dysuria, hematuria, hematochezia, melena.  She had some dyspnea on exertion, but stated it was not any worse than usual.  Her last BM was on 01/12/19.  She denies any new medications, but continues to smoke 1ppd.   In the ED, she was afebrile and hemodynamically stable.  BMP, LFTs and CBC were essentially unremarkable except some polycythemia and WBC 11.3.  The patient was afebrile hemodynamically stable but was hypoxic with oxygen saturation 87% on room air.  CT of abd/pelvis showed SBO secondary to a large paraumbilical ventral hernia without strangulation.  NG was placed for decompression.  General surgery was consulted to assist.  Assessment/Plan: SBO - RESOLVED -POD#1 s/p incisional herniorrhaphy with mesh omentectomy - Pt is recovering well. Diet advanced by Dr. Arnoldo Morale.    Acute respiratory failure with hypoxia -Secondary to COPD exacerbation with a component of aspiration pneumonitis -Increasing oxygen requirement, now on 10L HFNC.  DC fluids, IV lasix x 1 dose, CXR reviewed.  Follow in Stepdown ICU today. -goal oxygen saturation 88% or higher.  Trying to avoid steroids if possible in immediate postop state.   COPD exacerbation -Continue Pulmicort -Increased duo nebs to every 4 hours -Given IV solumedrol 9/16 with good results  Essential hypertension -Hydralazine as needed SBP>180  Peripheral neuropathy -gabapentin once able to tolerate po  Disposition Plan:   Home when medically/surgically cleared  Family Communication:  Patient updated at  bedside  Consultants:  General surgery  Code Status:  FULL   DVT Prophylaxis:  [place] Heparin   Procedures: As Listed in Progress Note Above  Antibiotics: None  Subjective: Patient without complaints but has had increasing oxygen requirements.   Objective: Vitals:   01/16/19 0900 01/16/19 1000 01/16/19 1127 01/16/19 1200  BP: 115/88 120/68  122/60  Pulse: 74 65 62 71  Resp: 15 18    Temp:   98.4 F (36.9 C)   TempSrc:   Oral   SpO2: (!) 88% 91% 92% (!) 88%  Weight:      Height:        Intake/Output Summary (Last 24 hours) at 01/16/2019 1225 Last data filed at 01/16/2019 1000 Gross per 24 hour  Intake 1309.53 ml  Output 1155 ml  Net 154.53 ml   Weight change:  Exam:   General:  Pt is alert, follows commands appropriately, not in acute distress  HEENT: No icterus, No thrush, No neck mass, Gem/AT  Cardiovascular: normal S1/S2, no rubs, no gallops  Respiratory: diffuse wheezes and rales bilateral.  Abdomen: Soft/+BS,  no guarding  Extremities: 1 + LE edema, No lymphangitis, No petechiae, No rashes, no synovitis  Data Reviewed: I have personally reviewed following labs and imaging studies Basic Metabolic Panel: Recent Labs  Lab 01/13/19 1640 01/14/19 0447 01/15/19 0443 01/16/19 0414  NA 138 143 139 138  K 4.0 4.1 4.7 5.1  CL 98 104 102 105  CO2 30 30 26 26   GLUCOSE 131* 95 162* 128*  BUN 16 18 21* 30*  CREATININE 0.85 0.97 0.93 1.15*  CALCIUM 9.1 9.0 9.1 8.8*  MG  --   --  2.4 2.4  PHOS  --   --   --  4.5   Liver Function Tests: Recent Labs  Lab 01/13/19 1640  AST 18  ALT 16  ALKPHOS 83  BILITOT 1.1  PROT 8.2*  ALBUMIN 4.3   Recent Labs  Lab 01/13/19 1640  LIPASE 23   No results for input(s): AMMONIA in the last 168 hours. Coagulation Profile: No results for input(s): INR, PROTIME in the last 168 hours. CBC: Recent Labs  Lab 01/13/19 1640 01/14/19 0447 01/15/19 0443 01/16/19 0414  WBC 11.3* 10.3 14.7* 24.4*  NEUTROABS 10.1* 7.3   --   --   HGB 18.1* 16.9* 16.4* 15.3*  HCT 56.8* 54.0* 51.6* 48.2*  MCV 100.4* 103.1* 102.8* 101.7*  PLT 226 233 156 228   Cardiac Enzymes: No results for input(s): CKTOTAL, CKMB, CKMBINDEX, TROPONINI in the last 168 hours. BNP: Invalid input(s): POCBNP CBG: No results for input(s): GLUCAP in the last 168 hours. HbA1C: No results for input(s): HGBA1C in the last 72 hours. Urine analysis:    Component Value Date/Time   COLORURINE YELLOW 01/13/2019 1730   APPEARANCEUR CLOUDY (A) 01/13/2019 1730   LABSPEC 1.017 01/13/2019 1730   PHURINE 8.0 01/13/2019 1730   GLUCOSEU NEGATIVE 01/13/2019 1730   HGBUR NEGATIVE 01/13/2019 1730   BILIRUBINUR NEGATIVE 01/13/2019 1730   KETONESUR NEGATIVE 01/13/2019 1730   PROTEINUR 100 (A) 01/13/2019 1730   UROBILINOGEN 0.2 09/25/2010 0530   NITRITE NEGATIVE 01/13/2019 1730   LEUKOCYTESUR NEGATIVE 01/13/2019 1730    Recent Results (from the past 240 hour(s))  SARS Coronavirus 2 [facility] order, Performed in [facility] hospital lab) Nasopharyngeal Nasopharyngeal Swab     Status: None   Collection Time: 01/13/19  3:59 PM   Specimen: Nasopharyngeal Swab  Result Value Ref Range Status   SARS Coronavirus 2 NEGATIVE NEGATIVE Final    Comment: (NOTE) If result is NEGATIVE SARS-CoV-2 target nucleic acids are NOT DETECTED. The SARS-CoV-2 RNA is generally detectable in upper and lower  respiratory specimens during the acute phase of infection. The lowest  concentration of SARS-CoV-2 viral copies this assay can detect is 250  copies / mL. A negative result does not preclude SARS-CoV-2 infection  and should not be used as the sole basis for treatment or other  patient management decisions.  A negative result may occur with  improper specimen collection / handling, submission of specimen other  than nasopharyngeal swab, presence of viral mutation(s) within the  areas targeted by this assay, and inadequate number of viral copies  (<250 copies / mL). A  negative result must be combined with clinical  observations, patient history, and epidemiological information. If result is POSITIVE SARS-CoV-2 target nucleic acids are DETECTED. The SARS-CoV-2 RNA is generally detectable in upper and lower  respiratory specimens dur ing the acute phase of infection.  Positive  results are indicative of active infection with SARS-CoV-2.  Clinical  correlation with patient history and other diagnostic information is  necessary to determine patient infection status.  Positive results do  not rule out bacterial infection or co-infection with other viruses. If result is PRESUMPTIVE POSTIVE SARS-CoV-2 nucleic acids MAY BE PRESENT.   A presumptive positive result was obtained on the submitted specimen  and confirmed on repeat testing.  While 2019 novel coronavirus  (SARS-CoV-2) nucleic acids may be present in the submitted sample  additional confirmatory testing may be necessary for epidemiological  and / or clinical management purposes  to  differentiate between  SARS-CoV-2 and other Sarbecovirus currently known to infect humans.  If clinically indicated additional testing with an alternate test  methodology [phone]) is advised. The SARS-CoV-2 RNA is generally  detectable in upper and lower respiratory sp ecimens during the acute  phase of infection. The expected result is Negative. Fact Sheet for Patients:  StrictlyIdeas.no Fact Sheet for Healthcare Providers: BankingDealers.co.za This test is not yet approved or cleared by the [place] FDA and has been authorized for detection and/or diagnosis of SARS-CoV-2 by FDA under an Emergency Use Authorization (EUA).  This EUA will remain in effect (meaning this test can be used) for the duration of the COVID-19 declaration under Section 564(b)(1) of the Act, 21 U.S.C. section 360bbb-3(b)(1), unless the authorization is terminated or revoked sooner. Performed  at [facility], [address], [place], [place] [address]   Surgical pcr screen     Status: None   Collection Time: 01/14/19  7:41 PM   Specimen: Nasal Mucosa; Nasal Swab  Result Value Ref Range Status   MRSA, PCR NEGATIVE NEGATIVE Final   Staphylococcus aureus NEGATIVE NEGATIVE Final    Comment: (NOTE) The Xpert SA Assay (FDA approved for NASAL specimens in patients 65 years of age and older), is one component of a comprehensive surveillance program. It is not intended to diagnose infection nor to guide or monitor treatment. Performed at [facility], [address], [place], [place] [address]   MRSA PCR Screening     Status: None   Collection Time: 01/15/19  2:15 PM   Specimen: Nasal Mucosa; Nasopharyngeal  Result Value Ref Range Status   MRSA by PCR NEGATIVE NEGATIVE Final    Comment:        The GeneXpert MRSA Assay (FDA approved for NASAL specimens only), is one component of a comprehensive MRSA colonization surveillance program. It is not intended to diagnose MRSA infection nor to guide or monitor treatment for MRSA infections. Performed at [facility], [address], [place], [place] [address]      Scheduled Meds: . budesonide (PULMICORT) nebulizer solution  0.5 mg Nebulization BID  . Chlorhexidine Gluconate Cloth  6 each Topical Daily  . enoxaparin (LOVENOX) injection  40 mg Subcutaneous Q24H  . furosemide  30 mg Intravenous Once  . ipratropium-albuterol  3 mL Nebulization Q4H  . mouth rinse  15 mL Mouth Rinse BID  . sodium chloride flush  3 mL Intravenous Q12H   Continuous Infusions: . sodium chloride      Procedures/Studies: Ct Abdomen Pelvis W Contrast  Result Date: 01/13/2019 CLINICAL DATA:  Right-sided abdominal pain and nausea and vomiting beginning last night. Prior cholecystectomy. EXAM: CT ABDOMEN AND PELVIS WITH CONTRAST TECHNIQUE: Multidetector CT imaging of the abdomen and pelvis was performed using the standard protocol following bolus  administration of intravenous contrast. CONTRAST:  122mL OMNIPAQUE IOHEXOL 300 MG/ML  SOLN COMPARISON:  05/14/2012 FINDINGS: Lower Chest: Increased linear opacity in both lung bases since 2014, which may be due to subsegmental atelectasis or scarring. Hepatobiliary: No hepatic masses identified. Prior cholecystectomy. No evidence of biliary obstruction. Pancreas:  No mass or inflammatory changes. Spleen: Within normal limits in size and appearance. Adrenals/Urinary Tract: No masses identified. Several small left renal cysts again noted. No evidence of hydronephrosis. Stomach/Bowel: Large paraumbilical ventral hernia is again seen containing multiple small bowel loops. Moderate dilatation of small bowel is seen within the hernia and proximal to the hernia, consistent with small-bowel obstruction. No evidence of small bowel wall thickening or pneumatosis.  No evidence of inflammatory process or abnormal fluid collections. Diverticulosis is seen mainly involving the descending and sigmoid colon, however there is no evidence of diverticulitis. Vascular/Lymphatic: No pathologically enlarged lymph nodes. No abdominal aortic aneurysm. Aortic atherosclerosis. Reproductive:  No mass or other significant abnormality. Other:  None. Musculoskeletal:  No suspicious bone lesions identified. IMPRESSION: 1. Small bowel obstruction due to large paraumbilical ventral hernia, which contains multiple small bowel loops. No signs of bowel strangulation. 2. Colonic diverticulosis, without radiographic evidence of diverticulitis. Aortic Atherosclerosis (ICD10-I70.0). Electronically Signed   By: Marlaine Hind M.D.   On: 01/13/2019 18:50   Dg Chest Port 1 View  Result Date: 01/16/2019 CLINICAL DATA:  Hypoxia today. EXAM: PORTABLE CHEST 1 VIEW COMPARISON:  Single-view of the chest 01/13/2019. PA and lateral chest 09/25/2010. CT chest 10/03/2010. FINDINGS: NG tube has been removed. There is cardiomegaly and interstitial edema. Right pleural  effusion and basilar airspace disease have increased. Left basilar airspace disease is mildly improved. No pneumothorax. IMPRESSION: No marked change in cardiomegaly and interstitial edema. Increased small right pleural effusion and basilar atelectasis. Left pleural effusion is slightly decreased. Electronically Signed   By: Inge Rise M.D.   On: 01/16/2019 09:34   Dg Chest Portable 1 View  Result Date: 01/13/2019 CLINICAL DATA:  NG tube placement EXAM: PORTABLE CHEST 1 VIEW COMPARISON:  Radiograph 01/13/2019 FINDINGS: Interval placement of a transesophageal tube with tip and side port distal to the GE junction, curling within the gastric lumen in the left upper quadrant. Redemonstration of the diffuse interstitial opacities and moderate cardiomegaly. No pneumothorax. No new consolidative process. No acute osseous or soft tissue abnormality. Degenerative changes are present in the imaged spine and shoulders. IMPRESSION: 1. Appropriate positioning of the transesophageal tube with the tip and side port distal to the GE junction 2. Slightly diminished lung volumes with persistent interstitial pulmonary edema and cardiomegaly. Electronically Signed   By: Lovena Le M.D.   On: 01/13/2019 20:15   Dg Chest Portable 1 View  Result Date: 01/13/2019 CLINICAL DATA:  Cough. Nausea and vomiting. Right-sided abdominal pain. EXAM: PORTABLE CHEST 1 VIEW COMPARISON:  09/25/2010 FINDINGS: Mild-to-moderate cardiomegaly. Diffuse interstitial infiltrates, suspicious for interstitial edema. Stable linear opacity in right lower lung, consistent with scarring. No evidence of pulmonary consolidation or pleural effusion. IMPRESSION: Cardiomegaly and diffuse interstitial infiltrates, suspicious for mild congestive heart failure. Electronically Signed   By: Marlaine Hind M.D.   On: 01/13/2019 16:41    Critical care time spent 35 mins   Clanford Wynetta Emery, MD [facility] How to contact the [facility] Attending or Consulting  provider [place] or covering provider during after hours [place], for this patient?  1. Check the care team in [facility] and look for a) attending/consulting [facility] provider listed and b) the [facility] team listed 2. Log into [email or web address] and use [place]'s universal password to access. If you do not have the password, please contact the hospital operator. 3. Locate the [facility] provider you are looking for under [facility] and page to a number that you can be directly reached. 4. If you still have difficulty reaching the provider, please page the [facility] (Director on Call) for the Hospitalists listed on amion for assistance.   If 7PM-7AM, please contact night-coverage [email or web address] Password [email or web address] 01/16/2019, 12:25 PM   LOS: 2 days

## 2019-01-16 NOTE — Evaluation (Signed)
Physical Therapy Evaluation Patient Details Name: Veronica Horton MRN: [id number] DOB: [birth year] Today's Date: 01/16/2019   History of Present Illness  Veronica Horton is a 58 y.o. female with medical history significant for COPD, hypertension, hiatal hernia, and history of cholecystectomy, now presenting to the emergency department for evaluation of abdominal pain with nausea and vomiting.  Patient reports that she been in her usual state of health until last night when she developed abdominal pain with nausea and vomiting.  Pain and nausea/vomiting persisted throughout the night and through today, not necessarily worsening, but not improving either.  She also had a marked increase in her chronic cough last night accompanied by thick sputum production.  She does not feel particularly short of breath.  She has not noted any lower extremity swelling or tenderness.  She denies any fevers or chills.  She has not had these symptoms previously.  She denies diarrhea, melena, or hematochezia.    Clinical Impression  Pt presents seated at EOB, obtained position independently, had transfered to [facility] then back to bed; urine on floor, supplemental O2 disconnected, EKG leads off, pulse ox off due to becoming tangled and reports pressing call bell but no one came. Pt able to transfer from EOB and chair with min guard assist and hand held assist, demonstrating fair balance upon standing with hand held assist. Pt able to take 3-4 steps at bedside with min guard assist and single hand held assist, limited by fatigue with 7L O2 and O2 sat 82-88% with mobility. Pt also limited by increasing pain with mobility and coughing. Pt with significant support system at home and feels confident in returning home once discharged. Pt not normally on supplemental home O2, but does have COPD and confirms cigarette use so possible normative range; denies excessive shortness of breath or fatigue with mobility. Pt tolerates remaining up in  bedside chair at EOS with call bell at side, bracing pillow in lap and educated to call nursing if needs arise or wants to transfer back to bed and pt verbalizes understanding. RN in room to assist in cleaning pt up and changing linens, call bell tried and working appropriately. Patient will benefit from continued physical therapy in hospital and recommended venue below to increase strength, balance, endurance for safe ADLs and gait.     Follow Up Recommendations Home health PT;Supervision - Intermittent    Equipment Recommendations  3in1 (PT)    Recommendations for Other Services       Precautions / Restrictions Precautions Precautions: None Restrictions Weight Bearing Restrictions: No      Mobility  Bed Mobility               General bed mobility comments: seated EOB upon arrival, obtained position independently without assistance  Transfers Overall transfer level: Needs assistance Equipment used: 1 person hand held assist Transfers: Sit to/from Stand;Stand Pivot Transfers Sit to Stand: Min guard Stand pivot transfers: Min guard       General transfer comment: slow, labored movement, steadiness upon standing, offered hand held assist upon rising due to baseline with SPC/RW  Ambulation/Gait Ambulation/Gait assistance: Min guard Gait Distance (Feet): 4 Feet Assistive device: 1 person hand held assist Gait Pattern/deviations: Step-to pattern;Decreased step length - right;Decreased step length - left;Decreased stride length Gait velocity: decreased   General Gait Details: limited to 3-4 steps at bedside with HHA, limited by pain at incision site and decreased endurance, on 7L O2 and O2 sat 82-88% throughout session  Stairs  Wheelchair Mobility    Modified Rankin (Stroke Patients Only)       Balance Overall balance assessment: Needs assistance Sitting-balance support: Feet supported;No upper extremity supported Sitting balance-Leahy Scale:  Good Sitting balance - Comments: seated EOB   Standing balance support: During functional activity;Single extremity supported Standing balance-Leahy Scale: Fair Standing balance comment: single hand held assist                             Pertinent Vitals/Pain Pain Assessment: Faces Faces Pain Scale: Hurts little more Pain Location: abdomen/surgical site Pain Intervention(s): Limited activity within patient's tolerance;Monitored during session;Repositioned;Other (comment)(offered bracing pillow to hug when coughing)    Home Living Family/patient expects to be discharged to:: Private residence Living Arrangements: Spouse/significant other;Other relatives Available Help at Discharge: Family;Available 24 hours/day Type of Home: House Home Access: Ramped entrance     Home Layout: One level Home Equipment: Walker - 2 wheels;Cane - single point      Prior Function Level of Independence: Independent with assistive device(s)         Comments: Pt reports Ind with ADLs, sleeps in propped up position, not on supplemental home O2, uses SPC/RW around home and limited community distances with occasional steps around the house without AD     Hand Dominance        Extremity/Trunk Assessment   Upper Extremity Assessment Upper Extremity Assessment: Overall WFL for tasks assessed    Lower Extremity Assessment Lower Extremity Assessment: Overall WFL for tasks assessed    Cervical / Trunk Assessment Cervical / Trunk Assessment: Normal  Communication   Communication: No difficulties  Cognition Arousal/Alertness: Awake/alert Behavior During Therapy: WFL for tasks assessed/performed Overall Cognitive Status: Within Functional Limits for tasks assessed                                        General Comments      Exercises     Assessment/Plan    PT Assessment Patient needs continued PT services  PT Problem List Decreased strength;Decreased  activity tolerance;Decreased balance;Pain       PT Treatment Interventions DME instruction;Gait training;Functional mobility training;Therapeutic activities;Therapeutic exercise;Balance training;Patient/family education    PT Goals (Current goals can be found in the Care Plan section)  Acute Rehab PT Goals Patient Stated Goal: return home PT Goal Formulation: With patient Time For Goal Achievement: 01/23/19 Potential to Achieve Goals: Good    Frequency Min 3X/week   Barriers to discharge        Co-evaluation               AM-PAC PT "6 Clicks" Mobility  Outcome Measure Help needed turning from your back to your side while in a flat bed without using bedrails?: None Help needed moving from lying on your back to sitting on the side of a flat bed without using bedrails?: None Help needed moving to and from a bed to a chair (including a wheelchair)?: A Little Help needed standing up from a chair using your arms (e.g., wheelchair or bedside chair)?: A Little Help needed to walk in hospital room?: A Little Help needed climbing 3-5 steps with a railing? : A Lot 6 Click Score: 19    End of Session Equipment Utilized During Treatment: Oxygen(7 LPM O2) Activity Tolerance: Patient tolerated treatment well Patient left: in chair;with call bell/phone within reach Nurse  Communication: Mobility status PT Visit Diagnosis: Unsteadiness on feet (R26.81);Other abnormalities of gait and mobility (R26.89);Muscle weakness (generalized) (M62.81)    Time: 1455-1520 PT Time Calculation (min) (ACUTE ONLY): 25 min   Charges:   PT Evaluation $PT Eval Moderate Complexity: 1 Mod           Tori Julliana Whitmyer PT, DPT 01/16/19, 3:41 PM [phone]

## 2019-01-16 NOTE — TOC Initial Note (Signed)
Transition of Care [facility]) - Initial/Assessment Note    Patient Details  Name: Veronica Horton MRN: [id number] Date of Birth: 02-Feb-1961  Transition of Care [facility]) CM/SW Contact:    Ihor Gully, LCSW Phone Number: 01/16/2019, 10:36 AM  Clinical Narrative:                 Patient from home with aunt. Admitted for SBO. Independent at baseline. Ambulates with a cane mostly. Has a nebulizer, inhaler, and walker in the home. She does not drive. Family takes her to medical appointment. She has no issues obtaining medications.  Patient requests a tall, heavy duty bedside commode.  Attending made aware of request. DME choices provided and referral sent to agency.   Expected Discharge Plan: [place] Barriers to Discharge: Continued Medical Work up   Patient Goals and CMS Choice Patient states their goals for this hospitalization and ongoing recovery are:: to return home and get back to baseline CMS Medicare.gov Compare Post Acute Care list provided to:: Patient Choice offered to / list presented to : Patient  Expected Discharge Plan and Services Expected Discharge Plan: [place] In-house Referral: Clinical Social Work   Post Acute Care Choice: Durable Medical Equipment Living arrangements for the past 2 months: Single Family Home                 DME Arranged: Bedside commode DME Agency: [employer] Date DME Agency Contacted: 01/16/19 Time DME Agency Contacted: 80 Representative spoke with at DME Agency: Blake Divine            Prior Living Arrangements/Services Living arrangements for the past 2 months: [place] Lives with:: Relatives Patient language and need for interpreter reviewed:: Yes Do you feel safe going back to the place where you live?: Yes      Need for Family Participation in Patient Care: Yes (Comment) Care giver support system in place?: Yes (comment) Current home services: DME Criminal Activity/Legal  Involvement Pertinent to Current Situation/Hospitalization: No - Comment as needed  Activities of Daily Living Home Assistive Devices/Equipment: Eyeglasses, Cane (specify quad or straight), Walker (specify type) ADL Screening (condition at time of admission) Patient's cognitive ability adequate to safely complete daily activities?: Yes Is the patient deaf or have difficulty hearing?: No Does the patient have difficulty seeing, even when wearing glasses/contacts?: No Does the patient have difficulty concentrating, remembering, or making decisions?: No Patient able to express need for assistance with ADLs?: Yes Does the patient have difficulty dressing or bathing?: No Independently performs ADLs?: Yes (appropriate for developmental age) Does the patient have difficulty walking or climbing stairs?: No Weakness of Legs: None Weakness of Arms/Hands: None  Permission Sought/Granted                  Emotional Assessment Appearance:: Appears stated age   Affect (typically observed): Accepting Orientation: : Oriented to Self, Oriented to Place, Oriented to  Time, Oriented to Situation Alcohol / Substance Use: Not Applicable Psych Involvement: No (comment)  Admission diagnosis:  Small bowel obstruction ([place]) [K56.609] Patient Active Problem List   Diagnosis Date Noted  . Incisional hernia, without obstruction or gangrene   . COPD with acute exacerbation ([place]) 01/14/2019  . Small bowel obstruction ([place]) 01/13/2019  . COPD (chronic obstructive pulmonary disease) ([place])   . Hypertension   . Acute respiratory failure with hypoxia ([place])   . Polycythemia    PCP:  Sharilyn Sites, MD Pharmacy:   [facility]  Pharmacy, [place], [place] - [address] [place] [address] Phone: [phone] Fax: [phone]     Social Determinants of Health (SDOH) Interventions    Readmission Risk Interventions No flowsheet data found.

## 2019-01-16 NOTE — Plan of Care (Signed)
  Problem: Acute Rehab PT Goals(only PT should resolve) Goal: Patient Will Transfer Sit To/From Stand Outcome: Progressing Flowsheets (Taken 01/16/2019 1544) Patient will transfer sit to/from stand: with supervision Note: With LRAD Goal: Pt Will Transfer Bed To Chair/Chair To Bed Outcome: Progressing Flowsheets (Taken 01/16/2019 1544) Pt will Transfer Bed to Chair/Chair to Bed: with supervision Note: With LRAD Goal: Pt Will Ambulate Outcome: Progressing Flowsheets (Taken 01/16/2019 1544) Pt will Ambulate:  50 feet  with supervision  with least restrictive assistive device  Tori Christyn Gutkowski PT, DPT 01/16/19, 3:44 PM [phone]

## 2019-01-16 NOTE — Progress Notes (Signed)
  Echocardiogram 2D Echocardiogram has been performed.  Veronica Horton 01/16/2019, 3:23 PM

## 2019-01-16 NOTE — TOC Progression Note (Signed)
Transition of Care [facility]) - Progression Note    Patient Details  Name: Veronica Horton MRN: [id number] Date of Birth: [birth year]  Transition of Care [facility]) CM/SW Contact  Ihor Gully, LCSW Phone Number: 01/16/2019, 5:27 PM  Clinical Narrative:    [name] with [employer] met with patient regarding charity BSC. Patient requested heavy duty BSC. Patient does not meet the weight requirement of 350 for heavy duty BSC. Patient does qualify for regular BSC.  Patient does not want regular BSC.  Patient declined charity [facility] and stated that she will go to the pharmacy and buy a used heavy duty BSC.     Expected Discharge Plan: [place] Barriers to Discharge: Continued Medical Work up  Expected Discharge Plan and Services Expected Discharge Plan: [place] In-house Referral: Clinical Social Work   Post Acute Care Choice: Durable Medical Equipment Living arrangements for the past 2 months: Single Family Home                 DME Arranged: Bedside commode DME Agency: [employer] Date DME Agency Contacted: 01/16/19 Time DME Agency Contacted: 1021 Representative spoke with at DME Agency: Blake Divine             Social Determinants of Health ([place]) Interventions    Readmission Risk Interventions No flowsheet data found.

## 2019-01-16 NOTE — Progress Notes (Signed)
1 Day Post-Op  Subjective: Patient denies any significant abdominal pain.  Denies shortness of breath to my examination.  Objective: Vital signs in last 24 hours: Temp:  [97.8 F (36.6 C)-98.7 F (37.1 C)] 98.5 F (36.9 C) (09/17 0728) Pulse Rate:  [53-92] 64 (09/17 0800) Resp:  [12-22] 17 (09/17 0800) BP: (99-200)/(51-103) 139/60 (09/17 0800) SpO2:  [87 %-96 %] 89 % (09/17 0800) Weight:  [127 kg-139 kg] 137.1 kg (09/17 0500) Last BM Date: 01/13/19  Intake/Output from previous day: 09/16 0701 - 09/17 0700 In: 3309.5 [I.V.:3309.5] Out: 1130 [Urine:850; Drains:180; Blood:100] Intake/Output this shift: No intake/output data recorded.  General appearance: alert, cooperative and no distress GI: Soft, incision healing well.  JP drainage serosanguineous in nature.  Lab Results:  Recent Labs    01/15/19 0443 01/16/19 0414  WBC 14.7* 24.4*  HGB 16.4* 15.3*  HCT 51.6* 48.2*  PLT 156 228   BMET Recent Labs    01/15/19 0443 01/16/19 0414  NA 139 138  K 4.7 5.1  CL 102 105  CO2 26 26  GLUCOSE 162* 128*  BUN 21* 30*  CREATININE 0.93 1.15*  CALCIUM 9.1 8.8*   PT/INR No results for input(s): LABPROT, INR in the last 72 hours.  Studies/Results: No results found.  Anti-infectives: Anti-infectives (From admission, onward)   Start     Dose/Rate Route Frequency Ordered Stop   01/15/19 0900  vancomycin (VANCOCIN) 1,500 mg in sodium chloride 0.9 % 500 mL IVPB     1,500 mg 250 mL/hr over 120 Minutes Intravenous On call to O.R. 01/15/19 0636 01/15/19 1015   01/15/19 0600  vancomycin (VANCOCIN) 1,500 mg in sodium chloride 0.9 % 500 mL IVPB     1,500 mg 250 mL/hr over 120 Minutes Intravenous On call to O.R. 01/14/19 1941 01/15/19 0837      Assessment/Plan: s/p Procedure(s): INCISIONAL HERNIORRHAPHY WITH MESH OMENTECTOMY Impression: Stable on postoperative day 1.  Leukocytosis most likely secondary to steroids.  Will advance to full liquid diet.  Will get patient out of  bed.  LOS: 2 days    [name] 01/16/2019

## 2019-01-17 ENCOUNTER — Inpatient Hospital Stay (HOSPITAL_COMMUNITY): Payer: Self-pay

## 2019-01-17 ENCOUNTER — Encounter (HOSPITAL_COMMUNITY): Payer: Self-pay | Admitting: Family Medicine

## 2019-01-17 DIAGNOSIS — J690 Pneumonitis due to inhalation of food and vomit: Secondary | ICD-10-CM

## 2019-01-17 HISTORY — DX: Pneumonitis due to inhalation of food and vomit: J69.0

## 2019-01-17 LAB — CBC
HCT: 46.9 % — ABNORMAL HIGH (ref 36.0–46.0)
Hemoglobin: 14.9 g/dL (ref 12.0–15.0)
MCH: 32.5 pg (ref 26.0–34.0)
MCHC: 31.8 g/dL (ref 30.0–36.0)
MCV: 102.2 fL — ABNORMAL HIGH (ref 80.0–100.0)
Platelets: 191 10*3/uL (ref 150–400)
RBC: 4.59 MIL/uL (ref 3.87–5.11)
RDW: 14.6 % (ref 11.5–15.5)
WBC: 14.2 10*3/uL — ABNORMAL HIGH (ref 4.0–10.5)
nRBC: 0 % (ref 0.0–0.2)

## 2019-01-17 LAB — MAGNESIUM: Magnesium: 2.3 mg/dL (ref 1.7–2.4)

## 2019-01-17 LAB — BASIC METABOLIC PANEL
Anion gap: 10 (ref 5–15)
BUN: 30 mg/dL — ABNORMAL HIGH (ref 6–20)
CO2: 25 mmol/L (ref 22–32)
Calcium: 8.4 mg/dL — ABNORMAL LOW (ref 8.9–10.3)
Chloride: 102 mmol/L (ref 98–111)
Creatinine, Ser: 1.01 mg/dL — ABNORMAL HIGH (ref 0.44–1.00)
GFR calc Af Amer: >60 mL/min (ref >60)
GFR calc non Af Amer: >60 mL/min (ref >60)
Glucose, Bld: 89 mg/dL (ref 70–99)
Potassium: 4.1 mmol/L (ref 3.5–5.1)
Sodium: 137 mmol/L (ref 135–145)

## 2019-01-17 LAB — BLOOD GAS, ARTERIAL
Acid-Base Excess: 5.5 mmol/L — ABNORMAL HIGH (ref 0.0–2.0)
Bicarbonate: 28.5 mmol/L — ABNORMAL HIGH (ref 20.0–28.0)
FIO2: 70
O2 Saturation: 84.2 %
Patient temperature: 37
pCO2 arterial: 45.8 mmHg (ref 32.0–48.0)
pH, Arterial: 7.429 (ref 7.350–7.450)
pO2, Arterial: 49.9 mmHg — ABNORMAL LOW (ref 83.0–108.0)

## 2019-01-17 LAB — SURGICAL PATHOLOGY

## 2019-01-17 MED ORDER — IOHEXOL 350 MG/ML SOLN
100.0000 mL | Freq: Once | INTRAVENOUS | Status: AC | PRN
  Administered 2019-01-17: 100 mL via INTRAVENOUS

## 2019-01-17 MED ORDER — CLINDAMYCIN PHOSPHATE 300 MG/50ML IV SOLN: 300.0000 mg | Freq: Four times a day (QID) | INTRAVENOUS | Status: DC

## 2019-01-17 MED ORDER — CLINDAMYCIN PHOSPHATE 600 MG/50ML IV SOLN
600.0000 mg | Freq: Three times a day (TID) | INTRAVENOUS | Status: AC
  Administered 2019-01-17 – 2019-01-20 (×9): 600 mg via INTRAVENOUS
  Filled 2019-01-17 (×9): qty 50

## 2019-01-17 MED ORDER — MAGNESIUM HYDROXIDE 400 MG/5ML PO SUSP
30.0000 mL | Freq: Two times a day (BID) | ORAL | Status: DC
  Administered 2019-01-17 – 2019-01-30 (×26): 30 mL via ORAL
  Filled 2019-01-17 (×26): qty 30

## 2019-01-17 MED ORDER — CLINDAMYCIN PHOSPHATE 300 MG/50ML IV SOLN
300.0000 mg | Freq: Four times a day (QID) | INTRAVENOUS | Status: DC
  Filled 2019-01-17 (×12): qty 50

## 2019-01-17 NOTE — Progress Notes (Signed)
Placed patient on Bipap at this time to help with low PaO2. ABG showed decreased O2 saturation. Patient is responding well to bipap. Vt is good and patient is not SOB or showing any increased WOB at the present time.

## 2019-01-17 NOTE — Progress Notes (Signed)
2 Days Post-Op  Subjective: Patient denies any significant abdominal pain.  Is hungry.  She states her breathing is better this morning.  No bowel movement recorded  Objective: Vital signs in last 24 hours: Temp:  [98.2 F (36.8 C)-98.8 F (37.1 C)] 98.2 F (36.8 C) (09/18 0758) Pulse Rate:  [61-89] 71 (09/18 0600) Resp:  [15-22] 18 (09/18 0600) BP: (120-151)/(55-78) 141/78 (09/18 0600) SpO2:  [87 %-98 %] 90 % (09/18 0906) Last BM Date: 01/13/19  Intake/Output from previous day: 09/17 0701 - 09/18 0700 In: 240 [P.O.:240] Out: 1035 [Urine:900; Drains:135] Intake/Output this shift: Total I/O In: -  Out: 50 [Drains:50]  General appearance: alert, cooperative and no distress GI: Soft, incision healing well.  Occasional bowel sounds appreciated.  Lab Results:  Recent Labs    01/16/19 0414 01/17/19 0456  WBC 24.4* 14.2*  HGB 15.3* 14.9  HCT 48.2* 46.9*  PLT 228 191   BMET Recent Labs    01/16/19 0414 01/17/19 0456  NA 138 137  K 5.1 4.1  CL 105 102  CO2 26 25  GLUCOSE 128* 89  BUN 30* 30*  CREATININE 1.15* 1.01*  CALCIUM 8.8* 8.4*   PT/INR No results for input(s): LABPROT, INR in the last 72 hours.  Studies/Results: Dg Chest Port 1 View  Result Date: 01/17/2019 CLINICAL DATA:  Acute respiratory failure. EXAM: PORTABLE CHEST 1 VIEW COMPARISON:  Radiograph of January 16, 2019. FINDINGS: Stable cardiomediastinal silhouette. No pneumothorax is noted. Stable bibasilar atelectasis or infiltrates are noted. Small bilateral pleural effusions are noted as well. Bony thorax unremarkable. IMPRESSION: Stable bibasilar opacities as described above. Electronically Signed   By: Marijo Conception M.D.   On: 01/17/2019 07:36   Dg Chest Port 1 View  Result Date: 01/16/2019 CLINICAL DATA:  Hypoxia today. EXAM: PORTABLE CHEST 1 VIEW COMPARISON:  Single-view of the chest 01/13/2019. PA and lateral chest 09/25/2010. CT chest 10/03/2010. FINDINGS: NG tube has been removed. There is  cardiomegaly and interstitial edema. Right pleural effusion and basilar airspace disease have increased. Left basilar airspace disease is mildly improved. No pneumothorax. IMPRESSION: No marked change in cardiomegaly and interstitial edema. Increased small right pleural effusion and basilar atelectasis. Left pleural effusion is slightly decreased. Electronically Signed   By: Inge Rise M.D.   On: 01/16/2019 09:34    Anti-infectives: Anti-infectives (From admission, onward)   Start     Dose/Rate Route Frequency Ordered Stop   01/15/19 0900  vancomycin (VANCOCIN) 1,500 mg in sodium chloride 0.9 % 500 mL IVPB     1,500 mg 250 mL/hr over 120 Minutes Intravenous On call to O.R. 01/15/19 0636 01/15/19 1015   01/15/19 0600  vancomycin (VANCOCIN) 1,500 mg in sodium chloride 0.9 % 500 mL IVPB     1,500 mg 250 mL/hr over 120 Minutes Intravenous On call to O.R. 01/14/19 1941 01/15/19 0837      Assessment/Plan: s/p Procedure(s): INCISIONAL HERNIORRHAPHY WITH MESH OMENTECTOMY Impression: Respiratory status variable, may be returning to baseline state.  Tolerating full liquid diet well.  Will advance to soft diet.  Leukocytosis resolving.  Continue getting patient out of bed.  LOS: 3 days    [name] 01/17/2019

## 2019-01-17 NOTE — Progress Notes (Signed)
PROGRESS NOTE  Veronica Horton OQH:[id number] DOB: 1960/10/13 DOA: 01/13/2019 PCP: Sharilyn Sites, MD  Brief History:  58 y/o female with history of COPD, HTN, tobacco abuse, morbid obesity presenting with abdominal pain with nausea and vomiting that started evening 01/12/19 and progressively worsened throughout the day on 01/13/19.  She also has had increasing cough with clear sputum x 1-2 days.  She denies f/c, cp, hemoptysis, dysuria, hematuria, hematochezia, melena.  She had some dyspnea on exertion, but stated it was not any worse than usual.  Her last BM was on 01/12/19.  She denies any new medications, but continues to smoke 1ppd.   In the ED, she was afebrile and hemodynamically stable.  BMP, LFTs and CBC were essentially unremarkable except some polycythemia and WBC 11.3.  The patient was afebrile hemodynamically stable but was hypoxic with oxygen saturation 87% on room air.  CT of abd/pelvis showed SBO secondary to a large paraumbilical ventral hernia without strangulation.  NG was placed for decompression.  General surgery was consulted to assist.  Assessment/Plan: SBO - RESOLVED -POD#2 s/p incisional herniorrhaphy with mesh omentectomy - Pt is recovering well. Diet per Dr. Arnoldo Morale.    Acute on chronic respiratory failure with hypoxia -Secondary to COPD exacerbation with a component of aspiration pneumonitis -Increasing oxygen requirement, ABG with hypoxemia pO2 54, started bipap. -CT chest - no PE but significant atelectasis and mucus plugging and aspirate seen distal trachea.   Aspiration Pneumonia - added clindamycin IV 01/17/19 (reported allergy to penicillin).    COPD exacerbation -Continue Pulmicort -Increased duo nebs to every 4 hours -Given IV solumedrol 9/16 with good results  Essential hypertension -Hydralazine as needed SBP>180  Peripheral neuropathy -gabapentin once able to tolerate po  Disposition Plan:   Home when medically/surgically cleared  Family  Communication:  Patient updated at bedside  Consultants:  General surgery  Code Status:  FULL   DVT Prophylaxis:  [place] Heparin   Procedures: As Listed in Progress Note Above  Antibiotics: None  Subjective: Patient reports that she is coughing but mostly nonproductive.  Wants to advance diet.   Objective: Vitals:   01/17/19 0900 01/17/19 0906 01/17/19 1000 01/17/19 1118  BP: (!) 101/41  (!) 107/55   Pulse: 71  70   Resp: 20  19   Temp:    98.1 F (36.7 C)  TempSrc:    Axillary  SpO2: (!) 87% 90% 90%   Weight:      Height:        Intake/Output Summary (Last 24 hours) at 01/17/2019 1240 Last data filed at 01/17/2019 0701 Gross per 24 hour  Intake 240 ml  Output 860 ml  Net -620 ml   Weight change:  Exam:   General:  Pt is alert, follows commands appropriately, not in acute distress  HEENT: No icterus, No thrush, No neck mass, [place]/AT  Cardiovascular: normal S1/S2, no rubs, no gallops  Respiratory: diffuse wheezes and rales bilateral.  Abdomen: Soft/+BS,  no guarding  Extremities: 1 + LE edema, No lymphangitis, No petechiae, No rashes, no synovitis  Data Reviewed: I have personally reviewed following labs and imaging studies Basic Metabolic Panel: Recent Labs  Lab 01/13/19 1640 01/14/19 0447 01/15/19 0443 01/16/19 0414 01/17/19 0456  NA 138 143 139 138 137  K 4.0 4.1 4.7 5.1 4.1  CL 98 104 102 105 102  CO2 30 30 26 26 25   GLUCOSE 131* 95 162* 128* 89  BUN 16 18  21* 30* 30*  CREATININE 0.85 0.97 0.93 1.15* 1.01*  CALCIUM 9.1 9.0 9.1 8.8* 8.4*  MG  --   --  2.4 2.4 2.3  PHOS  --   --   --  4.5  --    Liver Function Tests: Recent Labs  Lab 01/13/19 1640  AST 18  ALT 16  ALKPHOS 83  BILITOT 1.1  PROT 8.2*  ALBUMIN 4.3   Recent Labs  Lab 01/13/19 1640  LIPASE 23   No results for input(s): AMMONIA in the last 168 hours. Coagulation Profile: No results for input(s): INR, PROTIME in the last 168 hours. CBC: Recent Labs  Lab 01/13/19 1640  01/14/19 0447 01/15/19 0443 01/16/19 0414 01/17/19 0456  WBC 11.3* 10.3 14.7* 24.4* 14.2*  NEUTROABS 10.1* 7.3  --   --   --   HGB 18.1* 16.9* 16.4* 15.3* 14.9  HCT 56.8* 54.0* 51.6* 48.2* 46.9*  MCV 100.4* 103.1* 102.8* 101.7* 102.2*  PLT 226 233 156 228 191   Cardiac Enzymes: No results for input(s): CKTOTAL, CKMB, CKMBINDEX, TROPONINI in the last 168 hours. BNP: Invalid input(s): POCBNP CBG: No results for input(s): GLUCAP in the last 168 hours. HbA1C: No results for input(s): HGBA1C in the last 72 hours. Urine analysis:    Component Value Date/Time   COLORURINE YELLOW 01/13/2019 1730   APPEARANCEUR CLOUDY (A) 01/13/2019 1730   LABSPEC 1.017 01/13/2019 1730   PHURINE 8.0 01/13/2019 1730   GLUCOSEU NEGATIVE 01/13/2019 1730   HGBUR NEGATIVE 01/13/2019 1730   BILIRUBINUR NEGATIVE 01/13/2019 1730   KETONESUR NEGATIVE 01/13/2019 1730   PROTEINUR 100 (A) 01/13/2019 1730   UROBILINOGEN 0.2 09/25/2010 0530   NITRITE NEGATIVE 01/13/2019 1730   LEUKOCYTESUR NEGATIVE 01/13/2019 1730    Recent Results (from the past 240 hour(s))  SARS Coronavirus 2 [facility] order, Performed in [facility] hospital lab) Nasopharyngeal Nasopharyngeal Swab     Status: None   Collection Time: 01/13/19  3:59 PM   Specimen: Nasopharyngeal Swab  Result Value Ref Range Status   SARS Coronavirus 2 NEGATIVE NEGATIVE Final    Comment: (NOTE) If result is NEGATIVE SARS-CoV-2 target nucleic acids are NOT DETECTED. The SARS-CoV-2 RNA is generally detectable in upper and lower  respiratory specimens during the acute phase of infection. The lowest  concentration of SARS-CoV-2 viral copies this assay can detect is 250  copies / mL. A negative result does not preclude SARS-CoV-2 infection  and should not be used as the sole basis for treatment or other  patient management decisions.  A negative result may occur with  improper specimen collection / handling, submission of specimen other  than nasopharyngeal  swab, presence of viral mutation(s) within the  areas targeted by this assay, and inadequate number of viral copies  (<250 copies / mL). A negative result must be combined with clinical  observations, patient history, and epidemiological information. If result is POSITIVE SARS-CoV-2 target nucleic acids are DETECTED. The SARS-CoV-2 RNA is generally detectable in upper and lower  respiratory specimens dur ing the acute phase of infection.  Positive  results are indicative of active infection with SARS-CoV-2.  Clinical  correlation with patient history and other diagnostic information is  necessary to determine patient infection status.  Positive results do  not rule out bacterial infection or co-infection with other viruses. If result is PRESUMPTIVE POSTIVE SARS-CoV-2 nucleic acids MAY BE PRESENT.   A presumptive positive result was obtained on the submitted specimen  and confirmed on repeat testing.  While  2019 novel coronavirus  (SARS-CoV-2) nucleic acids may be present in the submitted sample  additional confirmatory testing may be necessary for epidemiological  and / or clinical management purposes  to differentiate between  SARS-CoV-2 and other Sarbecovirus currently known to infect humans.  If clinically indicated additional testing with an alternate test  methodology [phone]) is advised. The SARS-CoV-2 RNA is generally  detectable in upper and lower respiratory sp ecimens during the acute  phase of infection. The expected result is Negative. Fact Sheet for Patients:  StrictlyIdeas.no Fact Sheet for Healthcare Providers: BankingDealers.co.za This test is not yet approved or cleared by the [place] FDA and has been authorized for detection and/or diagnosis of SARS-CoV-2 by FDA under an Emergency Use Authorization (EUA).  This EUA will remain in effect (meaning this test can be used) for the duration of the COVID-19 declaration  under Section 564(b)(1) of the Act, 21 U.S.C. section 360bbb-3(b)(1), unless the authorization is terminated or revoked sooner. Performed at [facility], [address], [place], [place] [address]   Surgical pcr screen     Status: None   Collection Time: 01/14/19  7:41 PM   Specimen: Nasal Mucosa; Nasal Swab  Result Value Ref Range Status   MRSA, PCR NEGATIVE NEGATIVE Final   Staphylococcus aureus NEGATIVE NEGATIVE Final    Comment: (NOTE) The Xpert SA Assay (FDA approved for NASAL specimens in patients 47 years of age and older), is one component of a comprehensive surveillance program. It is not intended to diagnose infection nor to guide or monitor treatment. Performed at [facility], [address], [place], [place] [address]   MRSA PCR Screening     Status: None   Collection Time: 01/15/19  2:15 PM   Specimen: Nasal Mucosa; Nasopharyngeal  Result Value Ref Range Status   MRSA by PCR NEGATIVE NEGATIVE Final    Comment:        The GeneXpert MRSA Assay (FDA approved for NASAL specimens only), is one component of a comprehensive MRSA colonization surveillance program. It is not intended to diagnose MRSA infection nor to guide or monitor treatment for MRSA infections. Performed at [facility], [address], [place], [place] [address]      Scheduled Meds: . budesonide (PULMICORT) nebulizer solution  0.5 mg Nebulization BID  . Chlorhexidine Gluconate Cloth  6 each Topical Daily  . enoxaparin (LOVENOX) injection  40 mg Subcutaneous Q24H  . ipratropium-albuterol  3 mL Nebulization Q4H  . magnesium hydroxide  30 mL Oral BID  . mouth rinse  15 mL Mouth Rinse BID  . sodium chloride flush  3 mL Intravenous Q12H   Continuous Infusions: . sodium chloride    . clindamycin (CLEOCIN) IV      Procedures/Studies: Ct Angio Chest Pe W Or Wo Contrast  Result Date: 01/17/2019 CLINICAL DATA:  Dyspnea chronically. Two days postop small-bowel obstruction and hernia repair. EXAM: CT  ANGIOGRAPHY CHEST WITH CONTRAST TECHNIQUE: Multidetector CT imaging of the chest was performed using the standard protocol during bolus administration of intravenous contrast. Multiplanar CT image reconstructions and MIPs were obtained to evaluate the vascular anatomy. CONTRAST:  157mL OMNIPAQUE IOHEXOL 350 MG/ML SOLN COMPARISON:  Chest CT 10/03/2010 and abdominal CT 01/13/2019 FINDINGS: Cardiovascular: Mild cardiomegaly. Minimal calcified plaque over the left anterior descending coronary artery. Thoracic aorta is normal. Pulmonary arterial system is well opacified without evidence of emboli. Aberrant right subclavian artery. Remaining vascular structures are unremarkable. Mediastinum/Nodes: No significant mediastinal or hilar adenopathy. Lungs/Pleura: Lungs are adequately inflated  demonstrate moderate consolidation over the lower lobes bilaterally likely atelectasis. Mild atelectatic change over the right middle lobe and lingula. Subtle dependent atelectasis over the posterior right upper lobe. No significant effusion. Dependent debris over the distal trachea and right mainstem bronchus likely aspirate material. Obstructed right posterior lower lobe bronchus which may be due to mucous plugging versus aspiration. Narrowed right lower lobe bronchi. Upper Abdomen: No acute findings. Musculoskeletal: Degenerative change of the spine. Review of the MIP images confirms the above findings. IMPRESSION: 1.  No evidence of pulmonary embolism. 2. Significant bilateral lower lobe consolidation likely atelectasis. Mild atelectasis over the right middle lobe and lingula as well as minimal dependent atelectasis over the right upper lobe. No effusion. Aspirate material over the distal trachea and right mainstem bronchus. 3. Cardiomegaly with minimal atherosclerotic coronary artery disease. Electronically Signed   By: Marin Olp M.D.   On: 01/17/2019 10:12   Ct Abdomen Pelvis W Contrast  Result Date: 01/13/2019 CLINICAL  DATA:  Right-sided abdominal pain and nausea and vomiting beginning last night. Prior cholecystectomy. EXAM: CT ABDOMEN AND PELVIS WITH CONTRAST TECHNIQUE: Multidetector CT imaging of the abdomen and pelvis was performed using the standard protocol following bolus administration of intravenous contrast. CONTRAST:  168mL OMNIPAQUE IOHEXOL 300 MG/ML  SOLN COMPARISON:  05/14/2012 FINDINGS: Lower Chest: Increased linear opacity in both lung bases since 2014, which may be due to subsegmental atelectasis or scarring. Hepatobiliary: No hepatic masses identified. Prior cholecystectomy. No evidence of biliary obstruction. Pancreas:  No mass or inflammatory changes. Spleen: Within normal limits in size and appearance. Adrenals/Urinary Tract: No masses identified. Several small left renal cysts again noted. No evidence of hydronephrosis. Stomach/Bowel: Large paraumbilical ventral hernia is again seen containing multiple small bowel loops. Moderate dilatation of small bowel is seen within the hernia and proximal to the hernia, consistent with small-bowel obstruction. No evidence of small bowel wall thickening or pneumatosis. No evidence of inflammatory process or abnormal fluid collections. Diverticulosis is seen mainly involving the descending and sigmoid colon, however there is no evidence of diverticulitis. Vascular/Lymphatic: No pathologically enlarged lymph nodes. No abdominal aortic aneurysm. Aortic atherosclerosis. Reproductive:  No mass or other significant abnormality. Other:  None. Musculoskeletal:  No suspicious bone lesions identified. IMPRESSION: 1. Small bowel obstruction due to large paraumbilical ventral hernia, which contains multiple small bowel loops. No signs of bowel strangulation. 2. Colonic diverticulosis, without radiographic evidence of diverticulitis. Aortic Atherosclerosis (ICD10-I70.0). Electronically Signed   By: Marlaine Hind M.D.   On: 01/13/2019 18:50   Dg Chest Port 1 View  Result Date:  01/17/2019 CLINICAL DATA:  Acute respiratory failure. EXAM: PORTABLE CHEST 1 VIEW COMPARISON:  Radiograph of January 16, 2019. FINDINGS: Stable cardiomediastinal silhouette. No pneumothorax is noted. Stable bibasilar atelectasis or infiltrates are noted. Small bilateral pleural effusions are noted as well. Bony thorax unremarkable. IMPRESSION: Stable bibasilar opacities as described above. Electronically Signed   By: Marijo Conception M.D.   On: 01/17/2019 07:36   Dg Chest Port 1 View  Result Date: 01/16/2019 CLINICAL DATA:  Hypoxia today. EXAM: PORTABLE CHEST 1 VIEW COMPARISON:  Single-view of the chest 01/13/2019. PA and lateral chest 09/25/2010. CT chest 10/03/2010. FINDINGS: NG tube has been removed. There is cardiomegaly and interstitial edema. Right pleural effusion and basilar airspace disease have increased. Left basilar airspace disease is mildly improved. No pneumothorax. IMPRESSION: No marked change in cardiomegaly and interstitial edema. Increased small right pleural effusion and basilar atelectasis. Left pleural effusion is slightly decreased. Electronically Signed  By: Inge Rise M.D.   On: 01/16/2019 09:34   Dg Chest Portable 1 View  Result Date: 01/13/2019 CLINICAL DATA:  NG tube placement EXAM: PORTABLE CHEST 1 VIEW COMPARISON:  Radiograph 01/13/2019 FINDINGS: Interval placement of a transesophageal tube with tip and side port distal to the GE junction, curling within the gastric lumen in the left upper quadrant. Redemonstration of the diffuse interstitial opacities and moderate cardiomegaly. No pneumothorax. No new consolidative process. No acute osseous or soft tissue abnormality. Degenerative changes are present in the imaged spine and shoulders. IMPRESSION: 1. Appropriate positioning of the transesophageal tube with the tip and side port distal to the GE junction 2. Slightly diminished lung volumes with persistent interstitial pulmonary edema and cardiomegaly. Electronically  Signed   By: Lovena Le M.D.   On: 01/13/2019 20:15   Dg Chest Portable 1 View  Result Date: 01/13/2019 CLINICAL DATA:  Cough. Nausea and vomiting. Right-sided abdominal pain. EXAM: PORTABLE CHEST 1 VIEW COMPARISON:  09/25/2010 FINDINGS: Mild-to-moderate cardiomegaly. Diffuse interstitial infiltrates, suspicious for interstitial edema. Stable linear opacity in right lower lung, consistent with scarring. No evidence of pulmonary consolidation or pleural effusion. IMPRESSION: Cardiomegaly and diffuse interstitial infiltrates, suspicious for mild congestive heart failure. Electronically Signed   By: Marlaine Hind M.D.   On: 01/13/2019 16:41    Critical care time spent 33 mins    Wynetta Emery, MD [facility] How to contact the [facility] Attending or Consulting provider [place] or covering provider during after hours [place], for this patient?  1. Check the care team in [facility] and look for a) attending/consulting [facility] provider listed and b) the [facility] team listed 2. Log into [email or web address] and use [place]'s universal password to access. If you do not have the password, please contact the hospital operator. 3. Locate the [facility] provider you are looking for under [facility] and page to a number that you can be directly reached. 4. If you still have difficulty reaching the provider, please page the [facility] (Director on Call) for the Hospitalists listed on amion for assistance.   If 7PM-7AM, please contact night-coverage [email or web address] Password [email or web address] 01/17/2019, 12:40 PM   LOS: 3 days

## 2019-01-18 LAB — COMPREHENSIVE METABOLIC PANEL
ALT: 16 U/L (ref 0–44)
AST: 14 U/L — ABNORMAL LOW (ref 15–41)
Albumin: 3 g/dL — ABNORMAL LOW (ref 3.5–5.0)
Alkaline Phosphatase: 52 U/L (ref 38–126)
Anion gap: 8 (ref 5–15)
BUN: 20 mg/dL (ref 6–20)
CO2: 30 mmol/L (ref 22–32)
Calcium: 8.5 mg/dL — ABNORMAL LOW (ref 8.9–10.3)
Chloride: 103 mmol/L (ref 98–111)
Creatinine, Ser: 0.92 mg/dL (ref 0.44–1.00)
GFR calc Af Amer: >60 mL/min (ref >60)
GFR calc non Af Amer: >60 mL/min (ref >60)
Glucose, Bld: 97 mg/dL (ref 70–99)
Potassium: 4.1 mmol/L (ref 3.5–5.1)
Sodium: 141 mmol/L (ref 135–145)
Total Bilirubin: 1 mg/dL (ref 0.3–1.2)
Total Protein: 6.1 g/dL — ABNORMAL LOW (ref 6.5–8.1)

## 2019-01-18 LAB — CBC WITH DIFFERENTIAL/PLATELET
Abs Immature Granulocytes: 0.03 10*3/uL (ref 0.00–0.07)
Basophils Absolute: 0 10*3/uL (ref 0.0–0.1)
Basophils Relative: 0 %
Eosinophils Absolute: 0 10*3/uL (ref 0.0–0.5)
Eosinophils Relative: 0 %
HCT: 44.4 % (ref 36.0–46.0)
Hemoglobin: 14 g/dL (ref 12.0–15.0)
Immature Granulocytes: 0 %
Lymphocytes Relative: 11 %
Lymphs Abs: 1.2 10*3/uL (ref 0.7–4.0)
MCH: 32.2 pg (ref 26.0–34.0)
MCHC: 31.5 g/dL (ref 30.0–36.0)
MCV: 102.1 fL — ABNORMAL HIGH (ref 80.0–100.0)
Monocytes Absolute: 1.1 10*3/uL — ABNORMAL HIGH (ref 0.1–1.0)
Monocytes Relative: 10 %
Neutro Abs: 9.2 10*3/uL — ABNORMAL HIGH (ref 1.7–7.7)
Neutrophils Relative %: 79 %
Platelets: 184 10*3/uL (ref 150–400)
RBC: 4.35 MIL/uL (ref 3.87–5.11)
RDW: 14.3 % (ref 11.5–15.5)
WBC: 11.7 10*3/uL — ABNORMAL HIGH (ref 4.0–10.5)
nRBC: 0 % (ref 0.0–0.2)

## 2019-01-18 LAB — MAGNESIUM: Magnesium: 2.4 mg/dL (ref 1.7–2.4)

## 2019-01-18 MED ORDER — GUAIFENESIN-DM 100-10 MG/5ML PO SYRP
5.0000 mL | ORAL_SOLUTION | ORAL | Status: DC | PRN
  Administered 2019-01-18 – 2019-01-20 (×2): 5 mL via ORAL
  Filled 2019-01-18 (×3): qty 5

## 2019-01-18 MED ORDER — POLYETHYLENE GLYCOL 3350 17 G PO PACK
17.0000 g | PACK | Freq: Two times a day (BID) | ORAL | Status: DC
  Administered 2019-01-18 (×2): 17 g via ORAL
  Filled 2019-01-18 (×3): qty 1

## 2019-01-18 MED ORDER — GUAIFENESIN ER 600 MG PO TB12
1200.0000 mg | ORAL_TABLET | Freq: Two times a day (BID) | ORAL | Status: DC
  Administered 2019-01-18 – 2019-01-30 (×25): 1200 mg via ORAL
  Filled 2019-01-18 (×24): qty 2

## 2019-01-18 NOTE — Progress Notes (Signed)
Patient was off BiPAP at first check around 2000 and on 15 lpm high flow. Saturation around 90. She stayed off BiPAP till around midnight at which time her saturation had decreased down to 85 or lower. Suspect that patients PO2/ saturation is chronically low as she show's no distress for this. Upon placing back on BiPAP at 100 percent oxygen her Saturation did not immediately increase. IT took approximately 5 to 10 minutes to reach a saturation of 91. She does have some aspiration from some time earlier and she is 2836.3 positive on fluids. Long history of smoking. Does have wheezing and rhonchi rt side is slightly more diminished. Also atelectasis is present. Started back on incentive.

## 2019-01-18 NOTE — Progress Notes (Signed)
PROGRESS NOTE  Veronica Horton QJJ:[id number] DOB: 02-20-1961 DOA: 01/13/2019 PCP: Sharilyn Sites, MD  Brief History:  58 y/o female with history of COPD, HTN, tobacco abuse, morbid obesity presenting with abdominal pain with nausea and vomiting that started evening 01/12/19 and progressively worsened throughout the day on 01/13/19.  She also has had increasing cough with clear sputum x 1-2 days.  She denies f/c, cp, hemoptysis, dysuria, hematuria, hematochezia, melena.  She had some dyspnea on exertion, but stated it was not any worse than usual.  Her last BM was on 01/12/19.  She denies any new medications, but continues to smoke 1ppd.   In the ED, she was afebrile and hemodynamically stable.  BMP, LFTs and CBC were essentially unremarkable except some polycythemia and WBC 11.3.  The patient was afebrile hemodynamically stable but was hypoxic with oxygen saturation 87% on room air.  CT of abd/pelvis showed SBO secondary to a large paraumbilical ventral hernia without strangulation.  NG was placed for decompression.  General surgery was consulted to assist.  Assessment/Plan: SBO - RESOLVED -POD#3 s/p incisional herniorrhaphy with mesh omentectomy - Pt is recovering well. Diet per Dr. Arnoldo Morale.    Acute on chronic respiratory failure with hypoxia -Secondary to COPD exacerbation with a component of aspiration pneumonitis -Increasing oxygen requirement, ABG with hypoxemia pO2 54, started bipap, now off bipap. -CT chest - no PE but significant atelectasis and mucus plugging and aspirate seen distal trachea. -Suspect she has chronic hypoxemia.  She is totally asymptomatic with a pulse ox in low to mid 80s.  Appreciate pulmonology consult.   Aspiration Pneumonia - added clindamycin IV 01/17/19 (reported allergy to penicillin).    COPD exacerbation -Continue Pulmicort -Increased duo nebs to every 4 hours -Given IV solumedrol 9/16 with good results  Essential hypertension -Hydralazine as  needed SBP>180  Peripheral neuropathy -gabapentin once able to tolerate po  Disposition Plan:   Home when medically/surgically cleared  Family Communication:  Patient updated at bedside  Consultants:  General surgery, pulmonology  Code Status:  FULL   DVT Prophylaxis:  [place] Heparin   Procedures: As Listed in Progress Note Above  Antibiotics: None  Subjective: Patient talking about wanting to go home.   Objective: Vitals:   01/18/19 0500 01/18/19 0600 01/18/19 0721 01/18/19 0752  BP: (!) 118/54 (!) 142/70    Pulse: 74 71    Resp: 16 17    Temp: 98 F (36.7 C)   98.9 F (37.2 C)  TempSrc: Oral   Oral  SpO2: (!) 89% (!) 88% (!) 88%   Weight: 135.4 kg     Height:        Intake/Output Summary (Last 24 hours) at 01/18/2019 1104 Last data filed at 01/18/2019 0529 Gross per 24 hour  Intake 591.84 ml  Output 1460 ml  Net -868.16 ml   Weight change:  Exam:   General:  Pt is alert, follows commands appropriately, not in acute distress  HEENT: No icterus, No thrush, No neck mass, [place]/AT  Cardiovascular: normal S1/S2, no rubs, no gallops  Respiratory: BBS fairly clear to ausculation with good air movmement.  Abdomen: wounds clean and dry and healing. Soft/+BS,  no guarding  Extremities: 1 + LE edema, No lymphangitis, No petechiae, No rashes, no synovitis  Data Reviewed: I have personally reviewed following labs and imaging studies Basic Metabolic Panel: Recent Labs  Lab 01/14/19 0447 01/15/19 0443 01/16/19 0414 01/17/19 0456 01/18/19 0501  NA 143  139 138 137 141  K 4.1 4.7 5.1 4.1 4.1  CL 104 102 105 102 103  CO2 30 26 26 25 30   GLUCOSE 95 162* 128* 89 97  BUN 18 21* 30* 30* 20  CREATININE 0.97 0.93 1.15* 1.01* 0.92  CALCIUM 9.0 9.1 8.8* 8.4* 8.5*  MG  --  2.4 2.4 2.3 2.4  PHOS  --   --  4.5  --   --    Liver Function Tests: Recent Labs  Lab 01/13/19 1640 01/18/19 0501  AST 18 14*  ALT 16 16  ALKPHOS 83 52  BILITOT 1.1 1.0  PROT 8.2* 6.1*   ALBUMIN 4.3 3.0*   Recent Labs  Lab 01/13/19 1640  LIPASE 23   No results for input(s): AMMONIA in the last 168 hours. Coagulation Profile: No results for input(s): INR, PROTIME in the last 168 hours. CBC: Recent Labs  Lab 01/13/19 1640 01/14/19 0447 01/15/19 0443 01/16/19 0414 01/17/19 0456 01/18/19 0501  WBC 11.3* 10.3 14.7* 24.4* 14.2* 11.7*  NEUTROABS 10.1* 7.3  --   --   --  9.2*  HGB 18.1* 16.9* 16.4* 15.3* 14.9 14.0  HCT 56.8* 54.0* 51.6* 48.2* 46.9* 44.4  MCV 100.4* 103.1* 102.8* 101.7* 102.2* 102.1*  PLT 226 233 156 228 191 184   Cardiac Enzymes: No results for input(s): CKTOTAL, CKMB, CKMBINDEX, TROPONINI in the last 168 hours. BNP: Invalid input(s): POCBNP CBG: No results for input(s): GLUCAP in the last 168 hours. HbA1C: No results for input(s): HGBA1C in the last 72 hours. Urine analysis:    Component Value Date/Time   COLORURINE YELLOW 01/13/2019 1730   APPEARANCEUR CLOUDY (A) 01/13/2019 1730   LABSPEC 1.017 01/13/2019 1730   PHURINE 8.0 01/13/2019 1730   GLUCOSEU NEGATIVE 01/13/2019 1730   HGBUR NEGATIVE 01/13/2019 1730   BILIRUBINUR NEGATIVE 01/13/2019 1730   KETONESUR NEGATIVE 01/13/2019 1730   PROTEINUR 100 (A) 01/13/2019 1730   UROBILINOGEN 0.2 09/25/2010 0530   NITRITE NEGATIVE 01/13/2019 1730   LEUKOCYTESUR NEGATIVE 01/13/2019 1730    Recent Results (from the past 240 hour(s))  SARS Coronavirus 2 [facility] order, Performed in [facility] hospital lab) Nasopharyngeal Nasopharyngeal Swab     Status: None   Collection Time: 01/13/19  3:59 PM   Specimen: Nasopharyngeal Swab  Result Value Ref Range Status   SARS Coronavirus 2 NEGATIVE NEGATIVE Final    Comment: (NOTE) If result is NEGATIVE SARS-CoV-2 target nucleic acids are NOT DETECTED. The SARS-CoV-2 RNA is generally detectable in upper and lower  respiratory specimens during the acute phase of infection. The lowest  concentration of SARS-CoV-2 viral copies this assay can detect is  250  copies / mL. A negative result does not preclude SARS-CoV-2 infection  and should not be used as the sole basis for treatment or other  patient management decisions.  A negative result may occur with  improper specimen collection / handling, submission of specimen other  than nasopharyngeal swab, presence of viral mutation(s) within the  areas targeted by this assay, and inadequate number of viral copies  (<250 copies / mL). A negative result must be combined with clinical  observations, patient history, and epidemiological information. If result is POSITIVE SARS-CoV-2 target nucleic acids are DETECTED. The SARS-CoV-2 RNA is generally detectable in upper and lower  respiratory specimens dur ing the acute phase of infection.  Positive  results are indicative of active infection with SARS-CoV-2.  Clinical  correlation with patient history and other diagnostic information is  necessary to  determine patient infection status.  Positive results do  not rule out bacterial infection or co-infection with other viruses. If result is PRESUMPTIVE POSTIVE SARS-CoV-2 nucleic acids MAY BE PRESENT.   A presumptive positive result was obtained on the submitted specimen  and confirmed on repeat testing.  While 2019 novel coronavirus  (SARS-CoV-2) nucleic acids may be present in the submitted sample  additional confirmatory testing may be necessary for epidemiological  and / or clinical management purposes  to differentiate between  SARS-CoV-2 and other Sarbecovirus currently known to infect humans.  If clinically indicated additional testing with an alternate test  methodology [phone]) is advised. The SARS-CoV-2 RNA is generally  detectable in upper and lower respiratory sp ecimens during the acute  phase of infection. The expected result is Negative. Fact Sheet for Patients:  StrictlyIdeas.no Fact Sheet for Healthcare  Providers: BankingDealers.co.za This test is not yet approved or cleared by the [place] FDA and has been authorized for detection and/or diagnosis of SARS-CoV-2 by FDA under an Emergency Use Authorization (EUA).  This EUA will remain in effect (meaning this test can be used) for the duration of the COVID-19 declaration under Section 564(b)(1) of the Act, 21 U.S.C. section 360bbb-3(b)(1), unless the authorization is terminated or revoked sooner. Performed at [facility], [address], [place], [place] [address]   Surgical pcr screen     Status: None   Collection Time: 01/14/19  7:41 PM   Specimen: Nasal Mucosa; Nasal Swab  Result Value Ref Range Status   MRSA, PCR NEGATIVE NEGATIVE Final   Staphylococcus aureus NEGATIVE NEGATIVE Final    Comment: (NOTE) The Xpert SA Assay (FDA approved for NASAL specimens in patients 68 years of age and older), is one component of a comprehensive surveillance program. It is not intended to diagnose infection nor to guide or monitor treatment. Performed at [facility], [address], [place], [place] [address]   MRSA PCR Screening     Status: None   Collection Time: 01/15/19  2:15 PM   Specimen: Nasal Mucosa; Nasopharyngeal  Result Value Ref Range Status   MRSA by PCR NEGATIVE NEGATIVE Final    Comment:        The GeneXpert MRSA Assay (FDA approved for NASAL specimens only), is one component of a comprehensive MRSA colonization surveillance program. It is not intended to diagnose MRSA infection nor to guide or monitor treatment for MRSA infections. Performed at [facility], [address] [place], [place] [address]      Scheduled Meds: . budesonide (PULMICORT) nebulizer solution  0.5 mg Nebulization BID  . Chlorhexidine Gluconate Cloth  6 each Topical Daily  . enoxaparin (LOVENOX) injection  40 mg Subcutaneous Q24H  . guaiFENesin  1,200 mg Oral BID  . ipratropium-albuterol  3 mL Nebulization Q4H  .  magnesium hydroxide  30 mL Oral BID  . mouth rinse  15 mL Mouth Rinse BID  . polyethylene glycol  17 g Oral BID  . sodium chloride flush  3 mL Intravenous Q12H   Continuous Infusions: . sodium chloride    . clindamycin (CLEOCIN) IV 100 mL/hr at 01/18/19 5465    Procedures/Studies: Ct Angio Chest Pe W Or Wo Contrast  Result Date: 01/17/2019 CLINICAL DATA:  Dyspnea chronically. Two days postop small-bowel obstruction and hernia repair. EXAM: CT ANGIOGRAPHY CHEST WITH CONTRAST TECHNIQUE: Multidetector CT imaging of the chest was performed using the standard protocol during bolus administration of intravenous contrast. Multiplanar CT image reconstructions and MIPs were obtained to  evaluate the vascular anatomy. CONTRAST:  151mL OMNIPAQUE IOHEXOL 350 MG/ML SOLN COMPARISON:  Chest CT 10/03/2010 and abdominal CT 01/13/2019 FINDINGS: Cardiovascular: Mild cardiomegaly. Minimal calcified plaque over the left anterior descending coronary artery. Thoracic aorta is normal. Pulmonary arterial system is well opacified without evidence of emboli. Aberrant right subclavian artery. Remaining vascular structures are unremarkable. Mediastinum/Nodes: No significant mediastinal or hilar adenopathy. Lungs/Pleura: Lungs are adequately inflated demonstrate moderate consolidation over the lower lobes bilaterally likely atelectasis. Mild atelectatic change over the right middle lobe and lingula. Subtle dependent atelectasis over the posterior right upper lobe. No significant effusion. Dependent debris over the distal trachea and right mainstem bronchus likely aspirate material. Obstructed right posterior lower lobe bronchus which may be due to mucous plugging versus aspiration. Narrowed right lower lobe bronchi. Upper Abdomen: No acute findings. Musculoskeletal: Degenerative change of the spine. Review of the MIP images confirms the above findings. IMPRESSION: 1.  No evidence of pulmonary embolism. 2. Significant bilateral lower  lobe consolidation likely atelectasis. Mild atelectasis over the right middle lobe and lingula as well as minimal dependent atelectasis over the right upper lobe. No effusion. Aspirate material over the distal trachea and right mainstem bronchus. 3. Cardiomegaly with minimal atherosclerotic coronary artery disease. Electronically Signed   By: Marin Olp M.D.   On: 01/17/2019 10:12   Ct Abdomen Pelvis W Contrast  Result Date: 01/13/2019 CLINICAL DATA:  Right-sided abdominal pain and nausea and vomiting beginning last night. Prior cholecystectomy. EXAM: CT ABDOMEN AND PELVIS WITH CONTRAST TECHNIQUE: Multidetector CT imaging of the abdomen and pelvis was performed using the standard protocol following bolus administration of intravenous contrast. CONTRAST:  184mL OMNIPAQUE IOHEXOL 300 MG/ML  SOLN COMPARISON:  05/14/2012 FINDINGS: Lower Chest: Increased linear opacity in both lung bases since 2014, which may be due to subsegmental atelectasis or scarring. Hepatobiliary: No hepatic masses identified. Prior cholecystectomy. No evidence of biliary obstruction. Pancreas:  No mass or inflammatory changes. Spleen: Within normal limits in size and appearance. Adrenals/Urinary Tract: No masses identified. Several small left renal cysts again noted. No evidence of hydronephrosis. Stomach/Bowel: Large paraumbilical ventral hernia is again seen containing multiple small bowel loops. Moderate dilatation of small bowel is seen within the hernia and proximal to the hernia, consistent with small-bowel obstruction. No evidence of small bowel wall thickening or pneumatosis. No evidence of inflammatory process or abnormal fluid collections. Diverticulosis is seen mainly involving the descending and sigmoid colon, however there is no evidence of diverticulitis. Vascular/Lymphatic: No pathologically enlarged lymph nodes. No abdominal aortic aneurysm. Aortic atherosclerosis. Reproductive:  No mass or other significant abnormality.  Other:  None. Musculoskeletal:  No suspicious bone lesions identified. IMPRESSION: 1. Small bowel obstruction due to large paraumbilical ventral hernia, which contains multiple small bowel loops. No signs of bowel strangulation. 2. Colonic diverticulosis, without radiographic evidence of diverticulitis. Aortic Atherosclerosis (ICD10-I70.0). Electronically Signed   By: Marlaine Hind M.D.   On: 01/13/2019 18:50   Dg Chest Port 1 View  Result Date: 01/17/2019 CLINICAL DATA:  Acute respiratory failure. EXAM: PORTABLE CHEST 1 VIEW COMPARISON:  Radiograph of January 16, 2019. FINDINGS: Stable cardiomediastinal silhouette. No pneumothorax is noted. Stable bibasilar atelectasis or infiltrates are noted. Small bilateral pleural effusions are noted as well. Bony thorax unremarkable. IMPRESSION: Stable bibasilar opacities as described above. Electronically Signed   By: Marijo Conception M.D.   On: 01/17/2019 07:36   Dg Chest Port 1 View  Result Date: 01/16/2019 CLINICAL DATA:  Hypoxia today. EXAM: PORTABLE CHEST 1 VIEW COMPARISON:  Single-view of the chest 01/13/2019. PA and lateral chest 09/25/2010. CT chest 10/03/2010. FINDINGS: NG tube has been removed. There is cardiomegaly and interstitial edema. Right pleural effusion and basilar airspace disease have increased. Left basilar airspace disease is mildly improved. No pneumothorax. IMPRESSION: No marked change in cardiomegaly and interstitial edema. Increased small right pleural effusion and basilar atelectasis. Left pleural effusion is slightly decreased. Electronically Signed   By: Inge Rise M.D.   On: 01/16/2019 09:34   Dg Chest Portable 1 View  Result Date: 01/13/2019 CLINICAL DATA:  NG tube placement EXAM: PORTABLE CHEST 1 VIEW COMPARISON:  Radiograph 01/13/2019 FINDINGS: Interval placement of a transesophageal tube with tip and side port distal to the GE junction, curling within the gastric lumen in the left upper quadrant. Redemonstration of the  diffuse interstitial opacities and moderate cardiomegaly. No pneumothorax. No new consolidative process. No acute osseous or soft tissue abnormality. Degenerative changes are present in the imaged spine and shoulders. IMPRESSION: 1. Appropriate positioning of the transesophageal tube with the tip and side port distal to the GE junction 2. Slightly diminished lung volumes with persistent interstitial pulmonary edema and cardiomegaly. Electronically Signed   By: Lovena Le M.D.   On: 01/13/2019 20:15   Dg Chest Portable 1 View  Result Date: 01/13/2019 CLINICAL DATA:  Cough. Nausea and vomiting. Right-sided abdominal pain. EXAM: PORTABLE CHEST 1 VIEW COMPARISON:  09/25/2010 FINDINGS: Mild-to-moderate cardiomegaly. Diffuse interstitial infiltrates, suspicious for interstitial edema. Stable linear opacity in right lower lung, consistent with scarring. No evidence of pulmonary consolidation or pleural effusion. IMPRESSION: Cardiomegaly and diffuse interstitial infiltrates, suspicious for mild congestive heart failure. Electronically Signed   By: Marlaine Hind M.D.   On: 01/13/2019 16:41    Critical care time spent 31 mins  Clanford Wynetta Emery, MD [facility] How to contact the [facility] Attending or Consulting provider [place] or covering provider during after hours [place], for this patient?  1. Check the care team in [facility] and look for a) attending/consulting [facility] provider listed and b) the [facility] team listed 2. Log into [email or web address] and use [place]'s universal password to access. If you do not have the password, please contact the hospital operator. 3. Locate the [facility] provider you are looking for under [facility] and page to a number that you can be directly reached. 4. If you still have difficulty reaching the provider, please page the [facility] (Director on Call) for the Hospitalists listed on amion for assistance.   If 7PM-7AM, please contact night-coverage [email or web address] Password [email or web address] 01/18/2019,  11:04 AM   LOS: 4 days

## 2019-01-18 NOTE — Consult Note (Signed)
Consult requested by: [facility] hospitalist, Dr. Wynetta Emery Consult requested for: Hypoxia  HPI: This is a 58 year old who came to the emergency department on 01/13/2019 with abdominal pain nausea and vomiting.  She also was coughing.  She was found to have small bowel obstruction from a ventral hernia.  It was felt that she had probably aspirated.  She underwent surgery for her hernia on the 16th and had hernia repair with mesh and omentectomy.  She has been hypoxic since her surgery.  She has been on BiPAP.  She has COPD at baseline.  She is coughing up a little bit of sputum.  She is known to have a large hiatal hernia hypertension degenerative disc disease and chronic back pain from that.  Chest x-ray shows atelectasis/infiltrate bilaterally.  She is on clindamycin for that.  She says she actually feels okay and is pretty asymptomatic from her hypoxia.  No chest pain.  She has some incisional pain.  She is not having nausea or vomiting now.  She did not have fever or chills prior to her admission.  No headaches.  No issues with her sinuses except she says the oxygen is causing her to have some nasal congestion.  Past Medical History:  Diagnosis Date  . Bronchitis   . COPD (chronic obstructive pulmonary disease) ([place])   . Degenerative disc disease, lumbar   . Hiatal hernia   . Hypertension      Family History  Problem Relation Age of Onset  . Sudden Cardiac Death Neg Hx   No known family history of lung disease  Social History   Socioeconomic History  . Marital status: Divorced    Spouse name: Not on file  . Number of children: Not on file  . Years of education: Not on file  . Highest education level: Not on file  Occupational History  . Not on file  Social Needs  . Financial resource strain: Not on file  . Food insecurity    Worry: Not on file    Inability: Not on file  . Transportation needs    Medical: Not on file    Non-medical: Not on file  Tobacco Use  . Smoking status:  Current Every Day Smoker    Packs/day: 1.00  . Smokeless tobacco: Never Used  Substance and Sexual Activity  . Alcohol use: No  . Drug use: No  . Sexual activity: Yes    Birth control/protection: Post-menopausal  Lifestyle  . Physical activity    Days per week: Not on file    Minutes per session: Not on file  . Stress: Not on file  Relationships  . Social Herbalist on phone: Not on file    Gets together: Not on file    Attends religious service: Not on file    Active member of club or organization: Not on file    Attends meetings of clubs or organizations: Not on file    Relationship status: Not on file  Other Topics Concern  . Not on file  Social History Narrative  . Not on file     ROS: Except as mentioned 10 point review of systems is negative    Objective: Vital signs in last 24 hours: Temp:  [97.8 F (36.6 C)-98.9 F (37.2 C)] 98.9 F (37.2 C) (09/19 0752) Pulse Rate:  [64-94] 71 (09/19 0600) Resp:  [15-23] 17 (09/19 0600) BP: (101-153)/(41-80) 142/70 (09/19 0600) SpO2:  [85 %-94 %] 88 % (09/19 0721) FiO2 (%):  [  70 %-100 %] 90 % (09/19 0439) Weight:  [135.4 kg] 135.4 kg (09/19 0500) Weight change:  Last BM Date: 01/13/19  Intake/Output from previous day: 09/18 0701 - 09/19 0700 In: 591.8 [P.O.:480; I.V.:6; IV Piggyback:105.8] Out: 1510 [Urine:1300; Drains:210]  PHYSICAL EXAM Constitutional: She is awake and alert and does not appear to be in any acute distress.  She is morbidly obese.  Eyes: Pupils react EOMI.  Ears nose mouth and throat: Mucous membranes are moist.  Hearing is grossly normal.  Cardiovascular: Heart is regular with normal heart sounds.  Respiratory: Respiratory effort is mildly increased.  She has diminished breath sounds bilaterally.  Gastrointestinal: Nontender.  Musculoskeletal: Grossly normal strength in upper and lower extremities bilaterally.  Neurological: No focal abnormalities.  Psychiatric: Normal mood and affect  Lab  Results: Basic Metabolic Panel: Recent Labs    01/16/19 0414 01/17/19 0456 01/18/19 0501  NA 138 137 141  K 5.1 4.1 4.1  CL 105 102 103  CO2 26 25 30   GLUCOSE 128* 89 97  BUN 30* 30* 20  CREATININE 1.15* 1.01* 0.92  CALCIUM 8.8* 8.4* 8.5*  MG 2.4 2.3 2.4  PHOS 4.5  --   --    Liver Function Tests: Recent Labs    01/18/19 0501  AST 14*  ALT 16  ALKPHOS 52  BILITOT 1.0  PROT 6.1*  ALBUMIN 3.0*   No results for input(s): LIPASE, AMYLASE in the last 72 hours. No results for input(s): AMMONIA in the last 72 hours. CBC: Recent Labs    01/17/19 0456 01/18/19 0501  WBC 14.2* 11.7*  NEUTROABS  --  9.2*  HGB 14.9 14.0  HCT 46.9* 44.4  MCV 102.2* 102.1*  PLT 191 184   Cardiac Enzymes: No results for input(s): CKTOTAL, CKMB, CKMBINDEX, TROPONINI in the last 72 hours. BNP: No results for input(s): PROBNP in the last 72 hours. D-Dimer: No results for input(s): DDIMER in the last 72 hours. CBG: No results for input(s): GLUCAP in the last 72 hours. Hemoglobin A1C: No results for input(s): HGBA1C in the last 72 hours. Fasting Lipid Panel: No results for input(s): CHOL, HDL, LDLCALC, TRIG, CHOLHDL, LDLDIRECT in the last 72 hours. Thyroid Function Tests: No results for input(s): TSH, T4TOTAL, FREET4, T3FREE, THYROIDAB in the last 72 hours. Anemia Panel: No results for input(s): VITAMINB12, FOLATE, FERRITIN, TIBC, IRON, RETICCTPCT in the last 72 hours. Coagulation: No results for input(s): LABPROT, INR in the last 72 hours. Urine Drug Screen: Drugs of Abuse  No results found for: LABOPIA, COCAINSCRNUR, LABBENZ, AMPHETMU, THCU, LABBARB  Alcohol Level: No results for input(s): ETH in the last 72 hours. Urinalysis: No results for input(s): COLORURINE, LABSPEC, PHURINE, GLUCOSEU, HGBUR, BILIRUBINUR, KETONESUR, PROTEINUR, UROBILINOGEN, NITRITE, LEUKOCYTESUR in the last 72 hours.  Invalid input(s): APPERANCEUR Misc. Labs:   ABGS: Recent Labs    01/17/19 0840  PHART  7.429  PO2ART 49.9*  HCO3 28.5*     MICROBIOLOGY: Recent Results (from the past 240 hour(s))  SARS Coronavirus 2 [facility] order, Performed in [facility] hospital lab) Nasopharyngeal Nasopharyngeal Swab     Status: None   Collection Time: 01/13/19  3:59 PM   Specimen: Nasopharyngeal Swab  Result Value Ref Range Status   SARS Coronavirus 2 NEGATIVE NEGATIVE Final    Comment: (NOTE) If result is NEGATIVE SARS-CoV-2 target nucleic acids are NOT DETECTED. The SARS-CoV-2 RNA is generally detectable in upper and lower  respiratory specimens during the acute phase of infection. The lowest  concentration of SARS-CoV-2 viral  copies this assay can detect is 250  copies / mL. A negative result does not preclude SARS-CoV-2 infection  and should not be used as the sole basis for treatment or other  patient management decisions.  A negative result may occur with  improper specimen collection / handling, submission of specimen other  than nasopharyngeal swab, presence of viral mutation(s) within the  areas targeted by this assay, and inadequate number of viral copies  (<250 copies / mL). A negative result must be combined with clinical  observations, patient history, and epidemiological information. If result is POSITIVE SARS-CoV-2 target nucleic acids are DETECTED. The SARS-CoV-2 RNA is generally detectable in upper and lower  respiratory specimens dur ing the acute phase of infection.  Positive  results are indicative of active infection with SARS-CoV-2.  Clinical  correlation with patient history and other diagnostic information is  necessary to determine patient infection status.  Positive results do  not rule out bacterial infection or co-infection with other viruses. If result is PRESUMPTIVE POSTIVE SARS-CoV-2 nucleic acids MAY BE PRESENT.   A presumptive positive result was obtained on the submitted specimen  and confirmed on repeat testing.  While 2019 novel coronavirus   (SARS-CoV-2) nucleic acids may be present in the submitted sample  additional confirmatory testing may be necessary for epidemiological  and / or clinical management purposes  to differentiate between  SARS-CoV-2 and other Sarbecovirus currently known to infect humans.  If clinically indicated additional testing with an alternate test  methodology [phone]) is advised. The SARS-CoV-2 RNA is generally  detectable in upper and lower respiratory sp ecimens during the acute  phase of infection. The expected result is Negative. Fact Sheet for Patients:  StrictlyIdeas.no Fact Sheet for Healthcare Providers: BankingDealers.co.za This test is not yet approved or cleared by the [place] FDA and has been authorized for detection and/or diagnosis of SARS-CoV-2 by FDA under an Emergency Use Authorization (EUA).  This EUA will remain in effect (meaning this test can be used) for the duration of the COVID-19 declaration under Section 564(b)(1) of the Act, 21 U.S.C. section 360bbb-3(b)(1), unless the authorization is terminated or revoked sooner. Performed at [facility], [address], [place], [place] [address]   Surgical pcr screen     Status: None   Collection Time: 01/14/19  7:41 PM   Specimen: Nasal Mucosa; Nasal Swab  Result Value Ref Range Status   MRSA, PCR NEGATIVE NEGATIVE Final   Staphylococcus aureus NEGATIVE NEGATIVE Final    Comment: (NOTE) The Xpert SA Assay (FDA approved for NASAL specimens in patients 43 years of age and older), is one component of a comprehensive surveillance program. It is not intended to diagnose infection nor to guide or monitor treatment. Performed at [facility], [address] [place], [place] [address]   MRSA PCR Screening     Status: None   Collection Time: 01/15/19  2:15 PM   Specimen: Nasal Mucosa; Nasopharyngeal  Result Value Ref Range Status   MRSA by PCR NEGATIVE NEGATIVE Final    Comment:         The GeneXpert MRSA Assay (FDA approved for NASAL specimens only), is one component of a comprehensive MRSA colonization surveillance program. It is not intended to diagnose MRSA infection nor to guide or monitor treatment for MRSA infections. Performed at [facility], [address] [place], [place] [address]     Studies/Results: Ct Angio Chest Pe W Or Wo Contrast  Result Date: 01/17/2019 CLINICAL DATA:  Dyspnea  chronically. Two days postop small-bowel obstruction and hernia repair. EXAM: CT ANGIOGRAPHY CHEST WITH CONTRAST TECHNIQUE: Multidetector CT imaging of the chest was performed using the standard protocol during bolus administration of intravenous contrast. Multiplanar CT image reconstructions and MIPs were obtained to evaluate the vascular anatomy. CONTRAST:  14mL OMNIPAQUE IOHEXOL 350 MG/ML SOLN COMPARISON:  Chest CT 10/03/2010 and abdominal CT 01/13/2019 FINDINGS: Cardiovascular: Mild cardiomegaly. Minimal calcified plaque over the left anterior descending coronary artery. Thoracic aorta is normal. Pulmonary arterial system is well opacified without evidence of emboli. Aberrant right subclavian artery. Remaining vascular structures are unremarkable. Mediastinum/Nodes: No significant mediastinal or hilar adenopathy. Lungs/Pleura: Lungs are adequately inflated demonstrate moderate consolidation over the lower lobes bilaterally likely atelectasis. Mild atelectatic change over the right middle lobe and lingula. Subtle dependent atelectasis over the posterior right upper lobe. No significant effusion. Dependent debris over the distal trachea and right mainstem bronchus likely aspirate material. Obstructed right posterior lower lobe bronchus which may be due to mucous plugging versus aspiration. Narrowed right lower lobe bronchi. Upper Abdomen: No acute findings. Musculoskeletal: Degenerative change of the spine. Review of the MIP images confirms the above findings. IMPRESSION: 1.  No  evidence of pulmonary embolism. 2. Significant bilateral lower lobe consolidation likely atelectasis. Mild atelectasis over the right middle lobe and lingula as well as minimal dependent atelectasis over the right upper lobe. No effusion. Aspirate material over the distal trachea and right mainstem bronchus. 3. Cardiomegaly with minimal atherosclerotic coronary artery disease. Electronically Signed   By: Marin Olp M.D.   On: 01/17/2019 10:12   Dg Chest Port 1 View  Result Date: 01/17/2019 CLINICAL DATA:  Acute respiratory failure. EXAM: PORTABLE CHEST 1 VIEW COMPARISON:  Radiograph of January 16, 2019. FINDINGS: Stable cardiomediastinal silhouette. No pneumothorax is noted. Stable bibasilar atelectasis or infiltrates are noted. Small bilateral pleural effusions are noted as well. Bony thorax unremarkable. IMPRESSION: Stable bibasilar opacities as described above. Electronically Signed   By: Marijo Conception M.D.   On: 01/17/2019 07:36   Dg Chest Port 1 View  Result Date: 01/16/2019 CLINICAL DATA:  Hypoxia today. EXAM: PORTABLE CHEST 1 VIEW COMPARISON:  Single-view of the chest 01/13/2019. PA and lateral chest 09/25/2010. CT chest 10/03/2010. FINDINGS: NG tube has been removed. There is cardiomegaly and interstitial edema. Right pleural effusion and basilar airspace disease have increased. Left basilar airspace disease is mildly improved. No pneumothorax. IMPRESSION: No marked change in cardiomegaly and interstitial edema. Increased small right pleural effusion and basilar atelectasis. Left pleural effusion is slightly decreased. Electronically Signed   By: Inge Rise M.D.   On: 01/16/2019 09:34    Medications:  Prior to Admission:  Medications Prior to Admission  Medication Sig Dispense Refill Last Dose  . albuterol (PROVENTIL) (2.5 MG/3ML) 0.083% nebulizer solution Take 2.5 mg by nebulization every 6 (six) hours as needed for wheezing or shortness of breath.   unknown  . albuterol  (VENTOLIN HFA) 108 (90 Base) MCG/ACT inhaler Inhale 1-2 puffs into the lungs every 6 (six) hours as needed for wheezing or shortness of breath.   unknown  . cyclobenzaprine (FLEXERIL) 10 MG tablet Take 10 mg by mouth daily as needed for muscle spasms.   Past Month at Unknown time  . diphenhydrAMINE HCl (ALLERGY MEDICATION PO) Take 1 tablet by mouth at bedtime.   01/12/2019 at Unknown time  . gabapentin (NEURONTIN) 300 MG capsule Take 600 mg by mouth at bedtime.    01/12/2019 at Unknown time  . ibuprofen (ADVIL)  200 MG tablet Take 400 mg by mouth at bedtime.   01/12/2019 at Unknown time  . lisinopril-hydrochlorothiazide (ZESTORETIC) 10-12.5 MG tablet Take 1 tablet by mouth every morning.   01/13/2019 at Unknown time   Scheduled: . budesonide (PULMICORT) nebulizer solution  0.5 mg Nebulization BID  . Chlorhexidine Gluconate Cloth  6 each Topical Daily  . enoxaparin (LOVENOX) injection  40 mg Subcutaneous Q24H  . guaiFENesin  1,200 mg Oral BID  . ipratropium-albuterol  3 mL Nebulization Q4H  . magnesium hydroxide  30 mL Oral BID  . mouth rinse  15 mL Mouth Rinse BID  . sodium chloride flush  3 mL Intravenous Q12H   Continuous: . sodium chloride    . clindamycin (CLEOCIN) IV 100 mL/hr at 01/18/19 0529   WER:XVQMGQ chloride, acetaminophen **OR** acetaminophen, diphenhydrAMINE **OR** diphenhydrAMINE, fentaNYL (SUBLIMAZE) injection, ipratropium-albuterol, labetalol, LORazepam, ondansetron **OR** ondansetron (ZOFRAN) IV, polyvinyl alcohol, simethicone, sodium chloride flush, traMADol  Assesment: She was admitted with small bowel obstruction from incisional hernia.  She has been treated surgically for that.  In the time that she was having nausea and vomiting she had aspiration and she is being treated for aspiration pneumonia.  Chest x-ray yesterday still shows infiltrates bilaterally  She had polycythemia on admission and that could be from chronic hypoxia at home but also may be from  hemoconcentration  She has left ventricular hypertrophy on echocardiogram likely multifactorial.  She does not have evidence of heart failure.  I think she probably has hypoxia at home that has not been recognized.  She has COPD exacerbation which is being treated  Although she is hypoxic on high flow nasal cannula she is pretty comfortable so I think less likely to need intubation and mechanical ventilation Principal Problem:   Small bowel obstruction ([facility]) Active Problems:   COPD (chronic obstructive pulmonary disease) ([facility])   Hypertension   Acute respiratory failure with hypoxia ([place])   Polycythemia   COPD with acute exacerbation ([facility])   Incisional hernia, without obstruction or gangrene   Aspiration pneumonitis ([place])    Plan: Not much else to add at this point.  She is on maximum treatment.  Not really a candidate for steroids because of her recent surgery.  Continue BiPAP as needed.  Continue getting her up because I think that will help.  Encourage cough and deep breathing.  She has incentive spirometry.  She has flutter valve.  I have added Mucinex    LOS: 4 days   Alonza Bogus 01/18/2019, 8:41 AM

## 2019-01-18 NOTE — Progress Notes (Signed)
Pt O2 sat in the 70's and she is currently on her cell phone making calls. She is alert and oriented. Changed sensor to her ear and encouraged her attempt to breathe through her nose.

## 2019-01-18 NOTE — Progress Notes (Signed)
Attempted to walk patient in room. Removed oxygen and patient desaturated to 74 SPO2 while sitting. Had to place back on oxygen at 12 lpm high flow oxygen returned to 88 SPO2 while sitting. I did not attempt to walk her due to oxygen drop.

## 2019-01-18 NOTE — Progress Notes (Signed)
Patients oxygen saturation when change to high flow ran in the 86 to 80 range. Heated high flow was mistakenly set up at 20 liters , FiO2 at 40. The 40 should have been at 100 percent. This was done by me. Patient appeared not sob of breath despite oxygen saturation running low. She talked on phone etc. This was noted when placed on BiPAP for sleep. Saturation 96 on 60 FIO2 and on 16/6 f 8 BiPAP.

## 2019-01-18 NOTE — Progress Notes (Signed)
3 Days Post-Op  Subjective: No nausea or vomiting.  No bowel movement yet.  Tolerating soft diet well.  Objective: Vital signs in last 24 hours: Temp:  [97.8 F (36.6 C)-98.9 F (37.2 C)] 98.9 F (37.2 C) (09/19 0752) Pulse Rate:  [64-94] 71 (09/19 0600) Resp:  [15-23] 17 (09/19 0600) BP: (107-153)/(50-80) 142/70 (09/19 0600) SpO2:  [85 %-94 %] 88 % (09/19 0721) FiO2 (%):  [70 %-100 %] 90 % (09/19 0439) Weight:  [135.4 kg] 135.4 kg (09/19 0500) Last BM Date: 01/13/19  Intake/Output from previous day: 09/18 0701 - 09/19 0700 In: 591.8 [P.O.:480; I.V.:6; IV Piggyback:105.8] Out: 1510 [Urine:1300; Drains:210] Intake/Output this shift: No intake/output data recorded.  General appearance: alert, cooperative and no distress GI: Soft, incision healing well.  JP drainage serosanguineous in nature.  Lab Results:  Recent Labs    01/17/19 0456 01/18/19 0501  WBC 14.2* 11.7*  HGB 14.9 14.0  HCT 46.9* 44.4  PLT 191 184   BMET Recent Labs    01/17/19 0456 01/18/19 0501  NA 137 141  K 4.1 4.1  CL 102 103  CO2 25 30  GLUCOSE 89 97  BUN 30* 20  CREATININE 1.01* 0.92  CALCIUM 8.4* 8.5*   PT/INR No results for input(s): LABPROT, INR in the last 72 hours.  Studies/Results: Ct Angio Chest Pe W Or Wo Contrast  Result Date: 01/17/2019 CLINICAL DATA:  Dyspnea chronically. Two days postop small-bowel obstruction and hernia repair. EXAM: CT ANGIOGRAPHY CHEST WITH CONTRAST TECHNIQUE: Multidetector CT imaging of the chest was performed using the standard protocol during bolus administration of intravenous contrast. Multiplanar CT image reconstructions and MIPs were obtained to evaluate the vascular anatomy. CONTRAST:  174mL OMNIPAQUE IOHEXOL 350 MG/ML SOLN COMPARISON:  Chest CT 10/03/2010 and abdominal CT 01/13/2019 FINDINGS: Cardiovascular: Mild cardiomegaly. Minimal calcified plaque over the left anterior descending coronary artery. Thoracic aorta is normal. Pulmonary arterial system  is well opacified without evidence of emboli. Aberrant right subclavian artery. Remaining vascular structures are unremarkable. Mediastinum/Nodes: No significant mediastinal or hilar adenopathy. Lungs/Pleura: Lungs are adequately inflated demonstrate moderate consolidation over the lower lobes bilaterally likely atelectasis. Mild atelectatic change over the right middle lobe and lingula. Subtle dependent atelectasis over the posterior right upper lobe. No significant effusion. Dependent debris over the distal trachea and right mainstem bronchus likely aspirate material. Obstructed right posterior lower lobe bronchus which may be due to mucous plugging versus aspiration. Narrowed right lower lobe bronchi. Upper Abdomen: No acute findings. Musculoskeletal: Degenerative change of the spine. Review of the MIP images confirms the above findings. IMPRESSION: 1.  No evidence of pulmonary embolism. 2. Significant bilateral lower lobe consolidation likely atelectasis. Mild atelectasis over the right middle lobe and lingula as well as minimal dependent atelectasis over the right upper lobe. No effusion. Aspirate material over the distal trachea and right mainstem bronchus. 3. Cardiomegaly with minimal atherosclerotic coronary artery disease. Electronically Signed   By: Marin Olp M.D.   On: 01/17/2019 10:12   Dg Chest Port 1 View  Result Date: 01/17/2019 CLINICAL DATA:  Acute respiratory failure. EXAM: PORTABLE CHEST 1 VIEW COMPARISON:  Radiograph of January 16, 2019. FINDINGS: Stable cardiomediastinal silhouette. No pneumothorax is noted. Stable bibasilar atelectasis or infiltrates are noted. Small bilateral pleural effusions are noted as well. Bony thorax unremarkable. IMPRESSION: Stable bibasilar opacities as described above. Electronically Signed   By: Marijo Conception M.D.   On: 01/17/2019 07:36   Dg Chest Port 1 View  Result Date: 01/16/2019 CLINICAL  DATA:  Hypoxia today. EXAM: PORTABLE CHEST 1 VIEW  COMPARISON:  Single-view of the chest 01/13/2019. PA and lateral chest 09/25/2010. CT chest 10/03/2010. FINDINGS: NG tube has been removed. There is cardiomegaly and interstitial edema. Right pleural effusion and basilar airspace disease have increased. Left basilar airspace disease is mildly improved. No pneumothorax. IMPRESSION: No marked change in cardiomegaly and interstitial edema. Increased small right pleural effusion and basilar atelectasis. Left pleural effusion is slightly decreased. Electronically Signed   By: Inge Rise M.D.   On: 01/16/2019 09:34    Anti-infectives: Anti-infectives (From admission, onward)   Start     Dose/Rate Route Frequency Ordered Stop   01/17/19 1400  clindamycin (CLEOCIN) IVPB 600 mg     600 mg 100 mL/hr over 30 Minutes Intravenous Every 8 hours 01/17/19 1314 01/20/19 1359   01/17/19 1200  clindamycin (CLEOCIN) IVPB 300 mg  Status:  Discontinued     300 mg 100 mL/hr over 30 Minutes Intravenous Every 6 hours 01/17/19 1159 01/17/19 1159   01/17/19 1200  clindamycin (CLEOCIN) IVPB 300 mg  Status:  Discontinued     300 mg 100 mL/hr over 30 Minutes Intravenous Every 6 hours 01/17/19 1159 01/17/19 1314   01/15/19 0900  vancomycin (VANCOCIN) 1,500 mg in sodium chloride 0.9 % 500 mL IVPB     1,500 mg 250 mL/hr over 120 Minutes Intravenous On call to O.R. 01/15/19 0636 01/15/19 1015   01/15/19 0600  vancomycin (VANCOCIN) 1,500 mg in sodium chloride 0.9 % 500 mL IVPB     1,500 mg 250 mL/hr over 120 Minutes Intravenous On call to O.R. 01/14/19 1941 01/15/19 0837      Assessment/Plan: s/p Procedure(s): INCISIONAL HERNIORRHAPHY WITH MESH OMENTECTOMY Impression: Continues to have slow return of bowel function.  No evidence of obstruction.  Will start MiraLAX.  LOS: 4 days    [name] 01/18/2019

## 2019-01-19 MED ORDER — SACCHAROMYCES BOULARDII 250 MG PO CAPS
250.0000 mg | ORAL_CAPSULE | Freq: Two times a day (BID) | ORAL | Status: DC
  Administered 2019-01-19 – 2019-01-30 (×23): 250 mg via ORAL
  Filled 2019-01-19 (×22): qty 1

## 2019-01-19 MED ORDER — LISINOPRIL 10 MG PO TABS
10.0000 mg | ORAL_TABLET | Freq: Every day | ORAL | Status: DC
  Administered 2019-01-19 – 2019-01-20 (×2): 10 mg via ORAL
  Filled 2019-01-19 (×2): qty 1

## 2019-01-19 NOTE — Progress Notes (Signed)
Subjective: She was admitted with abdominal pain and eventually had to have herniorrhaphy and removal of omentum.  She had a incisional hernia.  Since that time she has been hypoxic.  She has COPD at baseline and it looks like she is likely hypoxic at home but that has not been documented.  Her oxygenation is better this morning.  She did not do well with trying to ambulate yesterday because of hypoxia.  She says she is coughing.  She feels fairly well.  She says she had 2 bowel movements  Objective: Vital signs in last 24 hours: Temp:  [97.9 F (36.6 C)-98.9 F (37.2 C)] 98.4 F (36.9 C) (09/20 0400) Pulse Rate:  [64-105] 65 (09/20 0531) Resp:  [13-24] 13 (09/20 0531) BP: (118-161)/(63-87) 132/66 (09/20 0531) SpO2:  [82 %-97 %] 96 % (09/20 0531) FiO2 (%):  [40 %-60 %] 60 % (09/20 0547) Weight:  [134.6 kg] 134.6 kg (09/20 0500) Weight change: -0.8 kg Last BM Date: 01/18/19  Intake/Output from previous day: 09/19 0701 - 09/20 0700 In: 742.5 [P.O.:600; IV Piggyback:142.5] Out: 690 [Urine:675; Drains:15]  PHYSICAL EXAM General appearance: She is awake and alert and in no acute distress.  She is morbidly obese Resp: She has bilateral wheezes left greater than right Cardio: regular rate and rhythm, S1, S2 normal, no murmur, click, rub or gallop GI: soft, non-tender; bowel sounds normal; no masses,  no organomegaly Extremities: extremities normal, atraumatic, no cyanosis or edema  Lab Results:  Results for orders placed or performed during the hospital encounter of 01/13/19 (from the past 48 hour(s))  Blood gas, arterial     Status: Abnormal   Collection Time: 01/17/19  8:40 AM  Result Value Ref Range   FIO2 70.00    pH, Arterial 7.429 7.350 - 7.450   pCO2 arterial 45.8 32.0 - 48.0 mmHg   pO2, Arterial 49.9 (L) 83.0 - 108.0 mmHg   Bicarbonate 28.5 (H) 20.0 - 28.0 mmol/L   Acid-Base Excess 5.5 (H) 0.0 - 2.0 mmol/L   O2 Saturation 84.2 %   Patient temperature 37.0    Allens test  (pass/fail) PASS PASS    Comment: Performed at [facility], [address], [place], [place] [address]  CBC with Differential/Platelet     Status: Abnormal   Collection Time: 01/18/19  5:01 AM  Result Value Ref Range   WBC 11.7 (H) 4.0 - 10.5 K/uL   RBC 4.35 3.87 - 5.11 MIL/uL   Hemoglobin 14.0 12.0 - 15.0 g/dL   HCT 44.4 36.0 - 46.0 %   MCV 102.1 (H) 80.0 - 100.0 fL   MCH 32.2 26.0 - 34.0 pg   MCHC 31.5 30.0 - 36.0 g/dL   RDW 14.3 11.5 - 15.5 %   Platelets 184 150 - 400 K/uL   nRBC 0.0 0.0 - 0.2 %   Neutrophils Relative % 79 %   Neutro Abs 9.2 (H) 1.7 - 7.7 K/uL   Lymphocytes Relative 11 %   Lymphs Abs 1.2 0.7 - 4.0 K/uL   Monocytes Relative 10 %   Monocytes Absolute 1.1 (H) 0.1 - 1.0 K/uL   Eosinophils Relative 0 %   Eosinophils Absolute 0.0 0.0 - 0.5 K/uL   Basophils Relative 0 %   Basophils Absolute 0.0 0.0 - 0.1 K/uL   Immature Granulocytes 0 %   Abs Immature Granulocytes 0.03 0.00 - 0.07 K/uL    Comment: Performed at [facility], [address] [place],  [address]  Comprehensive metabolic panel  Status: Abnormal   Collection Time: 01/18/19  5:01 AM  Result Value Ref Range   Sodium 141 135 - 145 mmol/L   Potassium 4.1 3.5 - 5.1 mmol/L   Chloride 103 98 - 111 mmol/L   CO2 30 22 - 32 mmol/L   Glucose, Bld 97 70 - 99 mg/dL   BUN 20 6 - 20 mg/dL   Creatinine, Ser 0.92 0.44 - 1.00 mg/dL   Calcium 8.5 (L) 8.9 - 10.3 mg/dL   Total Protein 6.1 (L) 6.5 - 8.1 g/dL   Albumin 3.0 (L) 3.5 - 5.0 g/dL   AST 14 (L) 15 - 41 U/L   ALT 16 0 - 44 U/L   Alkaline Phosphatase 52 38 - 126 U/L   Total Bilirubin 1.0 0.3 - 1.2 mg/dL   GFR calc non Af Amer >60 >60 mL/min   GFR calc Af Amer >60 >60 mL/min   Anion gap 8 5 - 15    Comment: Performed at [facility], [address], [place], [place] [address]  Magnesium     Status: None   Collection Time: 01/18/19  5:01 AM  Result Value Ref Range   Magnesium 2.4 1.7 - 2.4 mg/dL    Comment: Performed at [facility], [address] [place], [place] [address]    ABGS Recent Labs    01/17/19 0840  PHART 7.429  PO2ART 49.9*  HCO3 28.5*   CULTURES Recent Results (from the past 240 hour(s))  SARS Coronavirus 2 [facility] order, Performed in [facility] hospital lab) Nasopharyngeal Nasopharyngeal Swab     Status: None   Collection Time: 01/13/19  3:59 PM   Specimen: Nasopharyngeal Swab  Result Value Ref Range Status   SARS Coronavirus 2 NEGATIVE NEGATIVE Final    Comment: (NOTE) If result is NEGATIVE SARS-CoV-2 target nucleic acids are NOT DETECTED. The SARS-CoV-2 RNA is generally detectable in upper and lower  respiratory specimens during the acute phase of infection. The lowest  concentration of SARS-CoV-2 viral copies this assay can detect is 250  copies / mL. A negative result does not preclude SARS-CoV-2 infection  and should not be used as the sole basis for treatment or other  patient management decisions.  A negative result may occur with  improper specimen collection / handling, submission of specimen other  than nasopharyngeal swab, presence of viral mutation(s) within the  areas targeted by this assay, and inadequate number of viral copies  (<250 copies / mL). A negative result must be combined with clinical  observations, patient history, and epidemiological information. If result is POSITIVE SARS-CoV-2 target nucleic acids are DETECTED. The SARS-CoV-2 RNA is generally detectable in upper and lower  respiratory specimens dur ing the acute phase of infection.  Positive  results are indicative of active infection with SARS-CoV-2.  Clinical  correlation with patient history and other diagnostic information is  necessary to determine patient infection status.  Positive results do  not rule out bacterial infection or co-infection with other viruses. If result is PRESUMPTIVE POSTIVE SARS-CoV-2 nucleic acids MAY BE PRESENT.   A presumptive positive result was obtained on the submitted specimen  and  confirmed on repeat testing.  While 2019 novel coronavirus  (SARS-CoV-2) nucleic acids may be present in the submitted sample  additional confirmatory testing may be necessary for epidemiological  and / or clinical management purposes  to differentiate between  SARS-CoV-2 and other Sarbecovirus currently known to infect humans.  If clinically indicated additional testing with an alternate test  methodology [id number]) is advised. The SARS-CoV-2 RNA is generally  detectable in upper and lower respiratory sp ecimens during the acute  phase of infection. The expected result is Negative. Fact Sheet for Patients:  StrictlyIdeas.no Fact Sheet for Healthcare Providers: BankingDealers.co.za This test is not yet approved or cleared by the [place] FDA and has been authorized for detection and/or diagnosis of SARS-CoV-2 by FDA under an Emergency Use Authorization (EUA).  This EUA will remain in effect (meaning this test can be used) for the duration of the COVID-19 declaration under Section 564(b)(1) of the Act, 21 U.S.C. section 360bbb-3(b)(1), unless the authorization is terminated or revoked sooner. Performed at [facility], [address] [place], [place] [address]   Surgical pcr screen     Status: None   Collection Time: 01/14/19  7:41 PM   Specimen: Nasal Mucosa; Nasal Swab  Result Value Ref Range Status   MRSA, PCR NEGATIVE NEGATIVE Final   Staphylococcus aureus NEGATIVE NEGATIVE Final    Comment: (NOTE) The Xpert SA Assay (FDA approved for NASAL specimens in patients 16 years of age and older), is one component of a comprehensive surveillance program. It is not intended to diagnose infection nor to guide or monitor treatment. Performed at [facility], [address], [place], [place] [address]   MRSA PCR Screening     Status: None   Collection Time: 01/15/19  2:15 PM   Specimen: Nasal Mucosa; Nasopharyngeal  Result Value Ref Range  Status   MRSA by PCR NEGATIVE NEGATIVE Final    Comment:        The GeneXpert MRSA Assay (FDA approved for NASAL specimens only), is one component of a comprehensive MRSA colonization surveillance program. It is not intended to diagnose MRSA infection nor to guide or monitor treatment for MRSA infections. Performed at [facility], [address], [place], [place] [address]    Studies/Results: Ct Angio Chest Pe W Or Wo Contrast  Result Date: 01/17/2019 CLINICAL DATA:  Dyspnea chronically. Two days postop small-bowel obstruction and hernia repair. EXAM: CT ANGIOGRAPHY CHEST WITH CONTRAST TECHNIQUE: Multidetector CT imaging of the chest was performed using the standard protocol during bolus administration of intravenous contrast. Multiplanar CT image reconstructions and MIPs were obtained to evaluate the vascular anatomy. CONTRAST:  156mL OMNIPAQUE IOHEXOL 350 MG/ML SOLN COMPARISON:  Chest CT 10/03/2010 and abdominal CT 01/13/2019 FINDINGS: Cardiovascular: Mild cardiomegaly. Minimal calcified plaque over the left anterior descending coronary artery. Thoracic aorta is normal. Pulmonary arterial system is well opacified without evidence of emboli. Aberrant right subclavian artery. Remaining vascular structures are unremarkable. Mediastinum/Nodes: No significant mediastinal or hilar adenopathy. Lungs/Pleura: Lungs are adequately inflated demonstrate moderate consolidation over the lower lobes bilaterally likely atelectasis. Mild atelectatic change over the right middle lobe and lingula. Subtle dependent atelectasis over the posterior right upper lobe. No significant effusion. Dependent debris over the distal trachea and right mainstem bronchus likely aspirate material. Obstructed right posterior lower lobe bronchus which may be due to mucous plugging versus aspiration. Narrowed right lower lobe bronchi. Upper Abdomen: No acute findings. Musculoskeletal: Degenerative change of the spine. Review of the MIP  images confirms the above findings. IMPRESSION: 1.  No evidence of pulmonary embolism. 2. Significant bilateral lower lobe consolidation likely atelectasis. Mild atelectasis over the right middle lobe and lingula as well as minimal dependent atelectasis over the right upper lobe. No effusion. Aspirate material over the distal trachea and right mainstem bronchus. 3. Cardiomegaly with minimal atherosclerotic coronary artery disease. Electronically Signed  By: Marin Olp M.D.   On: 01/17/2019 10:12    Medications:  Prior to Admission:  Medications Prior to Admission  Medication Sig Dispense Refill Last Dose  . albuterol (PROVENTIL) (2.5 MG/3ML) 0.083% nebulizer solution Take 2.5 mg by nebulization every 6 (six) hours as needed for wheezing or shortness of breath.   unknown  . albuterol (VENTOLIN HFA) 108 (90 Base) MCG/ACT inhaler Inhale 1-2 puffs into the lungs every 6 (six) hours as needed for wheezing or shortness of breath.   unknown  . cyclobenzaprine (FLEXERIL) 10 MG tablet Take 10 mg by mouth daily as needed for muscle spasms.   Past Month at Unknown time  . diphenhydrAMINE HCl (ALLERGY MEDICATION PO) Take 1 tablet by mouth at bedtime.   01/12/2019 at Unknown time  . gabapentin (NEURONTIN) 300 MG capsule Take 600 mg by mouth at bedtime.    01/12/2019 at Unknown time  . ibuprofen (ADVIL) 200 MG tablet Take 400 mg by mouth at bedtime.   01/12/2019 at Unknown time  . lisinopril-hydrochlorothiazide (ZESTORETIC) 10-12.5 MG tablet Take 1 tablet by mouth every morning.   01/13/2019 at Unknown time   Scheduled: . budesonide (PULMICORT) nebulizer solution  0.5 mg Nebulization BID  . Chlorhexidine Gluconate Cloth  6 each Topical Daily  . enoxaparin (LOVENOX) injection  40 mg Subcutaneous Q24H  . guaiFENesin  1,200 mg Oral BID  . ipratropium-albuterol  3 mL Nebulization Q4H  . lisinopril  10 mg Oral Daily  . magnesium hydroxide  30 mL Oral BID  . mouth rinse  15 mL Mouth Rinse BID  . polyethylene  glycol  17 g Oral BID  . saccharomyces boulardii  250 mg Oral BID  . sodium chloride flush  3 mL Intravenous Q12H   Continuous: . sodium chloride    . clindamycin (CLEOCIN) IV 600 mg (01/19/19 0557)   DGU:YQIHKV chloride, acetaminophen **OR** acetaminophen, diphenhydrAMINE **OR** diphenhydrAMINE, fentaNYL (SUBLIMAZE) injection, guaiFENesin-dextromethorphan, ipratropium-albuterol, labetalol, LORazepam, ondansetron **OR** ondansetron (ZOFRAN) IV, polyvinyl alcohol, simethicone, sodium chloride flush, traMADol  Assesment: She has had small bowel obstruction with incisional hernia and eventually underwent repair.  At baseline she has COPD and she is having an exacerbation which is being treated.  She had an episode of aspiration during her vomiting from her small bowel obstruction and that is being treated with clindamycin.  She has acute hypoxic respiratory failure.  I think she probably has some element of chronic hypoxic respiratory failure that has not been diagnosed yet.  She has morbid obesity unchanged  She has chronic pain.  She was polycythemic on admission which may be related to hypoxia at home but she also may have had some hemoconcentration from dehydration.  Hemoglobin level yesterday was 14 Principal Problem:   Small bowel obstruction (HCC) Active Problems:   COPD (chronic obstructive pulmonary disease) (HCC)   Hypertension   Acute respiratory failure with hypoxia (HCC)   Polycythemia   COPD with acute exacerbation (HCC)   Incisional hernia, without obstruction or gangrene   Aspiration pneumonitis ([place])    Plan: Continue current treatments.  No changes in medications.  Continue efforts at getting her up and moving as allowed by her hypoxia.    LOS: 5 days   Veronica Horton 01/19/2019, 7:41 AM

## 2019-01-19 NOTE — Progress Notes (Signed)
4 Days Post-Op  Subjective: No abdominal pain.  Still with episodes of shortness of breath.  Did have bowel movements yesterday.  Tolerating diet well.  Objective: Vital signs in last 24 hours: Temp:  [97.9 F (36.6 C)-98.9 F (37.2 C)] 98.9 F (37.2 C) (09/20 0756) Pulse Rate:  [64-105] 90 (09/20 0900) Resp:  [13-24] 22 (09/20 0900) BP: (118-161)/(63-87) 140/67 (09/20 0800) SpO2:  [82 %-97 %] 86 % (09/20 0900) FiO2 (%):  [40 %-60 %] 60 % (09/20 0909) Weight:  [134.6 kg] 134.6 kg (09/20 0500) Last BM Date: 01/18/19  Intake/Output from previous day: 09/19 0701 - 09/20 0700 In: 742.5 [P.O.:600; IV Piggyback:142.5] Out: 690 [Urine:675; Drains:15] Intake/Output this shift: Total I/O In: 125 [P.O.:120; I.V.:5] Out: 71 [Drains:50]  General appearance: alert, cooperative and no distress GI: Soft, incision healing well.  Minimal JP drainage which is serosanguineous in nature.  Lab Results:  Recent Labs    01/17/19 0456 01/18/19 0501  WBC 14.2* 11.7*  HGB 14.9 14.0  HCT 46.9* 44.4  PLT 191 184   BMET Recent Labs    01/17/19 0456 01/18/19 0501  NA 137 141  K 4.1 4.1  CL 102 103  CO2 25 30  GLUCOSE 89 97  BUN 30* 20  CREATININE 1.01* 0.92  CALCIUM 8.4* 8.5*   PT/INR No results for input(s): LABPROT, INR in the last 72 hours.  Studies/Results: Ct Angio Chest Pe W Or Wo Contrast  Result Date: 01/17/2019 CLINICAL DATA:  Dyspnea chronically. Two days postop small-bowel obstruction and hernia repair. EXAM: CT ANGIOGRAPHY CHEST WITH CONTRAST TECHNIQUE: Multidetector CT imaging of the chest was performed using the standard protocol during bolus administration of intravenous contrast. Multiplanar CT image reconstructions and MIPs were obtained to evaluate the vascular anatomy. CONTRAST:  148mL OMNIPAQUE IOHEXOL 350 MG/ML SOLN COMPARISON:  Chest CT 10/03/2010 and abdominal CT 01/13/2019 FINDINGS: Cardiovascular: Mild cardiomegaly. Minimal calcified plaque over the left  anterior descending coronary artery. Thoracic aorta is normal. Pulmonary arterial system is well opacified without evidence of emboli. Aberrant right subclavian artery. Remaining vascular structures are unremarkable. Mediastinum/Nodes: No significant mediastinal or hilar adenopathy. Lungs/Pleura: Lungs are adequately inflated demonstrate moderate consolidation over the lower lobes bilaterally likely atelectasis. Mild atelectatic change over the right middle lobe and lingula. Subtle dependent atelectasis over the posterior right upper lobe. No significant effusion. Dependent debris over the distal trachea and right mainstem bronchus likely aspirate material. Obstructed right posterior lower lobe bronchus which may be due to mucous plugging versus aspiration. Narrowed right lower lobe bronchi. Upper Abdomen: No acute findings. Musculoskeletal: Degenerative change of the spine. Review of the MIP images confirms the above findings. IMPRESSION: 1.  No evidence of pulmonary embolism. 2. Significant bilateral lower lobe consolidation likely atelectasis. Mild atelectasis over the right middle lobe and lingula as well as minimal dependent atelectasis over the right upper lobe. No effusion. Aspirate material over the distal trachea and right mainstem bronchus. 3. Cardiomegaly with minimal atherosclerotic coronary artery disease. Electronically Signed   By: Marin Olp M.D.   On: 01/17/2019 10:12    Anti-infectives: Anti-infectives (From admission, onward)   Start     Dose/Rate Route Frequency Ordered Stop   01/17/19 1400  clindamycin (CLEOCIN) IVPB 600 mg     600 mg 100 mL/hr over 30 Minutes Intravenous Every 8 hours 01/17/19 1314 01/20/19 1359   01/17/19 1200  clindamycin (CLEOCIN) IVPB 300 mg  Status:  Discontinued     300 mg 100 mL/hr over 30 Minutes Intravenous  Every 6 hours 01/17/19 1159 01/17/19 1159   01/17/19 1200  clindamycin (CLEOCIN) IVPB 300 mg  Status:  Discontinued     300 mg 100 mL/hr over 30  Minutes Intravenous Every 6 hours 01/17/19 1159 01/17/19 1314   01/15/19 0900  vancomycin (VANCOCIN) 1,500 mg in sodium chloride 0.9 % 500 mL IVPB     1,500 mg 250 mL/hr over 120 Minutes Intravenous On call to O.R. 01/15/19 0636 01/15/19 1015   01/15/19 0600  vancomycin (VANCOCIN) 1,500 mg in sodium chloride 0.9 % 500 mL IVPB     1,500 mg 250 mL/hr over 120 Minutes Intravenous On call to O.R. 01/14/19 1941 01/15/19 0837      Assessment/Plan: s/p Procedure(s): INCISIONAL HERNIORRHAPHY WITH MESH OMENTECTOMY Impression: GI function has returned.  Patient's recovery complicated by acute exacerbation of chronic COPD.  Continue management per hospitalist.  LOS: 5 days    [name] 01/19/2019

## 2019-01-19 NOTE — Progress Notes (Signed)
PROGRESS NOTE  Veronica Horton EVO:[id number] DOB: August 22, 1960 DOA: 01/13/2019 PCP: Sharilyn Sites, MD  Brief History:  58 y/o female with history of COPD, HTN, tobacco abuse, morbid obesity presenting with abdominal pain with nausea and vomiting that started evening 01/12/19 and progressively worsened throughout the day on 01/13/19.  She also has had increasing cough with clear sputum x 1-2 days.  She denies f/c, cp, hemoptysis, dysuria, hematuria, hematochezia, melena.  She had some dyspnea on exertion, but stated it was not any worse than usual.  Her last BM was on 01/12/19.  She denies any new medications, but continues to smoke 1ppd.   In the ED, she was afebrile and hemodynamically stable.  BMP, LFTs and CBC were essentially unremarkable except some polycythemia and WBC 11.3.  The patient was afebrile hemodynamically stable but was hypoxic with oxygen saturation 87% on room air.  CT of abd/pelvis showed SBO secondary to a large paraumbilical ventral hernia without strangulation.  NG was placed for decompression.  General surgery was consulted to assist.  Assessment/Plan: SBO - RESOLVED -POD#4 s/p incisional herniorrhaphy with mesh omentectomy - Pt is recovering well. Diet per Dr. Arnoldo Morale.  Having bowel movements.    Acute on chronic respiratory failure with hypoxia -Secondary to COPD exacerbation with a component of aspiration pneumonitis -Increasing oxygen requirement, ABG with hypoxemia pO2 54, started bipap, now off bipap. -CT chest - no PE but significant atelectasis and mucus plugging and aspirate seen distal trachea. -Suspect she has chronic hypoxemia.  She is totally asymptomatic with a pulse ox in low to mid 80s.  Appreciate pulmonology consult.   Aspiration Pneumonia - added clindamycin IV 01/17/19 (reported allergy to penicillin).  Pt to complete antibiotics today.   COPD exacerbation -Continue Pulmicort -Increased duo nebs to every 4 hours -Given IV solumedrol 9/16 with  good results  Essential hypertension -Hydralazine as needed SBP>180  Peripheral neuropathy -gabapentin taken at home as needed  Disposition Plan:   Home when surgically cleared  Family Communication:  Patient updated at bedside  Consultants:  General surgery, pulmonology  Code Status:  FULL   DVT Prophylaxis:   Heparin   Procedures: As Listed in Progress Note Above  Antibiotics: None  Subjective: Patient reports having 2 BMs. Pt denies SOB.  Hypoxic with ambulation.    Objective: Vitals:   01/19/19 0415 01/19/19 0500 01/19/19 0531 01/19/19 0756  BP:   132/66   Pulse: 64  65   Resp: 15  13   Temp:    98.9 F (37.2 C)  TempSrc:    Oral  SpO2: 96%  96%   Weight:  134.6 kg    Height:        Intake/Output Summary (Last 24 hours) at 01/19/2019 0908 Last data filed at 01/19/2019 2993 Gross per 24 hour  Intake 507.47 ml  Output 690 ml  Net -182.53 ml   Weight change: -0.8 kg Exam:   General:  Pt is alert, follows commands appropriately, not in acute distress  HEENT: No icterus, No thrush, No neck mass, [place]/AT  Cardiovascular: normal S1/S2, no rubs, no gallops  Respiratory: BBS fairly clear to ausculation with bibasilar exp wheezes.  Abdomen: wounds clean and dry and healing. Soft/+BS,  no guarding  Extremities: 1 + LE edema, No lymphangitis, No petechiae, No rashes, no synovitis  Data Reviewed: I have personally reviewed following labs and imaging studies Basic Metabolic Panel: Recent Labs  Lab 01/14/19 0447 01/15/19 0443 01/16/19  0973 01/17/19 0456 01/18/19 0501  NA 143 139 138 137 141  K 4.1 4.7 5.1 4.1 4.1  CL 104 102 105 102 103  CO2 30 26 26 25 30   GLUCOSE 95 162* 128* 89 97  BUN 18 21* 30* 30* 20  CREATININE 0.97 0.93 1.15* 1.01* 0.92  CALCIUM 9.0 9.1 8.8* 8.4* 8.5*  MG  --  2.4 2.4 2.3 2.4  PHOS  --   --  4.5  --   --    Liver Function Tests: Recent Labs  Lab 01/13/19 1640 01/18/19 0501  AST 18 14*  ALT 16 16  ALKPHOS 83 52   BILITOT 1.1 1.0  PROT 8.2* 6.1*  ALBUMIN 4.3 3.0*   Recent Labs  Lab 01/13/19 1640  LIPASE 23   No results for input(s): AMMONIA in the last 168 hours. Coagulation Profile: No results for input(s): INR, PROTIME in the last 168 hours. CBC: Recent Labs  Lab 01/13/19 1640 01/14/19 0447 01/15/19 0443 01/16/19 0414 01/17/19 0456 01/18/19 0501  WBC 11.3* 10.3 14.7* 24.4* 14.2* 11.7*  NEUTROABS 10.1* 7.3  --   --   --  9.2*  HGB 18.1* 16.9* 16.4* 15.3* 14.9 14.0  HCT 56.8* 54.0* 51.6* 48.2* 46.9* 44.4  MCV 100.4* 103.1* 102.8* 101.7* 102.2* 102.1*  PLT 226 233 156 228 191 184   Cardiac Enzymes: No results for input(s): CKTOTAL, CKMB, CKMBINDEX, TROPONINI in the last 168 hours. BNP: Invalid input(s): POCBNP CBG: No results for input(s): GLUCAP in the last 168 hours. HbA1C: No results for input(s): HGBA1C in the last 72 hours. Urine analysis:    Component Value Date/Time   COLORURINE YELLOW 01/13/2019 1730   APPEARANCEUR CLOUDY (A) 01/13/2019 1730   LABSPEC 1.017 01/13/2019 1730   PHURINE 8.0 01/13/2019 1730   GLUCOSEU NEGATIVE 01/13/2019 1730   HGBUR NEGATIVE 01/13/2019 1730   BILIRUBINUR NEGATIVE 01/13/2019 1730   KETONESUR NEGATIVE 01/13/2019 1730   PROTEINUR 100 (A) 01/13/2019 1730   UROBILINOGEN 0.2 09/25/2010 0530   NITRITE NEGATIVE 01/13/2019 1730   LEUKOCYTESUR NEGATIVE 01/13/2019 1730    Recent Results (from the past 240 hour(s))  SARS Coronavirus 2 [facility] order, Performed in [facility] hospital lab) Nasopharyngeal Nasopharyngeal Swab     Status: None   Collection Time: 01/13/19  3:59 PM   Specimen: Nasopharyngeal Swab  Result Value Ref Range Status   SARS Coronavirus 2 NEGATIVE NEGATIVE Final    Comment: (NOTE) If result is NEGATIVE SARS-CoV-2 target nucleic acids are NOT DETECTED. The SARS-CoV-2 RNA is generally detectable in upper and lower  respiratory specimens during the acute phase of infection. The lowest  concentration of SARS-CoV-2 viral  copies this assay can detect is 250  copies / mL. A negative result does not preclude SARS-CoV-2 infection  and should not be used as the sole basis for treatment or other  patient management decisions.  A negative result may occur with  improper specimen collection / handling, submission of specimen other  than nasopharyngeal swab, presence of viral mutation(s) within the  areas targeted by this assay, and inadequate number of viral copies  (<250 copies / mL). A negative result must be combined with clinical  observations, patient history, and epidemiological information. If result is POSITIVE SARS-CoV-2 target nucleic acids are DETECTED. The SARS-CoV-2 RNA is generally detectable in upper and lower  respiratory specimens dur ing the acute phase of infection.  Positive  results are indicative of active infection with SARS-CoV-2.  Clinical  correlation with patient history  and other diagnostic information is  necessary to determine patient infection status.  Positive results do  not rule out bacterial infection or co-infection with other viruses. If result is PRESUMPTIVE POSTIVE SARS-CoV-2 nucleic acids MAY BE PRESENT.   A presumptive positive result was obtained on the submitted specimen  and confirmed on repeat testing.  While 2019 novel coronavirus  (SARS-CoV-2) nucleic acids may be present in the submitted sample  additional confirmatory testing may be necessary for epidemiological  and / or clinical management purposes  to differentiate between  SARS-CoV-2 and other Sarbecovirus currently known to infect humans.  If clinically indicated additional testing with an alternate test  methodology [phone]) is advised. The SARS-CoV-2 RNA is generally  detectable in upper and lower respiratory sp ecimens during the acute  phase of infection. The expected result is Negative. Fact Sheet for Patients:  StrictlyIdeas.no Fact Sheet for Healthcare  Providers: BankingDealers.co.za This test is not yet approved or cleared by the [place] FDA and has been authorized for detection and/or diagnosis of SARS-CoV-2 by FDA under an Emergency Use Authorization (EUA).  This EUA will remain in effect (meaning this test can be used) for the duration of the COVID-19 declaration under Section 564(b)(1) of the Act, 21 U.S.C. section 360bbb-3(b)(1), unless the authorization is terminated or revoked sooner. Performed at [facility], [address], [place], [place] [address]   Surgical pcr screen     Status: None   Collection Time: 01/14/19  7:41 PM   Specimen: Nasal Mucosa; Nasal Swab  Result Value Ref Range Status   MRSA, PCR NEGATIVE NEGATIVE Final   Staphylococcus aureus NEGATIVE NEGATIVE Final    Comment: (NOTE) The Xpert SA Assay (FDA approved for NASAL specimens in patients 75 years of age and older), is one component of a comprehensive surveillance program. It is not intended to diagnose infection nor to guide or monitor treatment. Performed at [facility], [address], [place], [place] [address]   MRSA PCR Screening     Status: None   Collection Time: 01/15/19  2:15 PM   Specimen: Nasal Mucosa; Nasopharyngeal  Result Value Ref Range Status   MRSA by PCR NEGATIVE NEGATIVE Final    Comment:        The GeneXpert MRSA Assay (FDA approved for NASAL specimens only), is one component of a comprehensive MRSA colonization surveillance program. It is not intended to diagnose MRSA infection nor to guide or monitor treatment for MRSA infections. Performed at [facility], [address] [place], [place] [address]      Scheduled Meds: . budesonide (PULMICORT) nebulizer solution  0.5 mg Nebulization BID  . Chlorhexidine Gluconate Cloth  6 each Topical Daily  . enoxaparin (LOVENOX) injection  40 mg Subcutaneous Q24H  . guaiFENesin  1,200 mg Oral BID  . ipratropium-albuterol  3 mL Nebulization Q4H  .  lisinopril  10 mg Oral Daily  . magnesium hydroxide  30 mL Oral BID  . mouth rinse  15 mL Mouth Rinse BID  . polyethylene glycol  17 g Oral BID  . saccharomyces boulardii  250 mg Oral BID  . sodium chloride flush  3 mL Intravenous Q12H   Continuous Infusions: . sodium chloride    . clindamycin (CLEOCIN) IV 600 mg (01/19/19 0557)    Procedures/Studies: Ct Angio Chest Pe W Or Wo Contrast  Result Date: 01/17/2019 CLINICAL DATA:  Dyspnea chronically. Two days postop small-bowel obstruction and hernia repair. EXAM: CT ANGIOGRAPHY CHEST WITH CONTRAST TECHNIQUE: Multidetector CT imaging  of the chest was performed using the standard protocol during bolus administration of intravenous contrast. Multiplanar CT image reconstructions and MIPs were obtained to evaluate the vascular anatomy. CONTRAST:  15mL OMNIPAQUE IOHEXOL 350 MG/ML SOLN COMPARISON:  Chest CT 10/03/2010 and abdominal CT 01/13/2019 FINDINGS: Cardiovascular: Mild cardiomegaly. Minimal calcified plaque over the left anterior descending coronary artery. Thoracic aorta is normal. Pulmonary arterial system is well opacified without evidence of emboli. Aberrant right subclavian artery. Remaining vascular structures are unremarkable. Mediastinum/Nodes: No significant mediastinal or hilar adenopathy. Lungs/Pleura: Lungs are adequately inflated demonstrate moderate consolidation over the lower lobes bilaterally likely atelectasis. Mild atelectatic change over the right middle lobe and lingula. Subtle dependent atelectasis over the posterior right upper lobe. No significant effusion. Dependent debris over the distal trachea and right mainstem bronchus likely aspirate material. Obstructed right posterior lower lobe bronchus which may be due to mucous plugging versus aspiration. Narrowed right lower lobe bronchi. Upper Abdomen: No acute findings. Musculoskeletal: Degenerative change of the spine. Review of the MIP images confirms the above findings.  IMPRESSION: 1.  No evidence of pulmonary embolism. 2. Significant bilateral lower lobe consolidation likely atelectasis. Mild atelectasis over the right middle lobe and lingula as well as minimal dependent atelectasis over the right upper lobe. No effusion. Aspirate material over the distal trachea and right mainstem bronchus. 3. Cardiomegaly with minimal atherosclerotic coronary artery disease. Electronically Signed   By: Marin Olp M.D.   On: 01/17/2019 10:12   Ct Abdomen Pelvis W Contrast  Result Date: 01/13/2019 CLINICAL DATA:  Right-sided abdominal pain and nausea and vomiting beginning last night. Prior cholecystectomy. EXAM: CT ABDOMEN AND PELVIS WITH CONTRAST TECHNIQUE: Multidetector CT imaging of the abdomen and pelvis was performed using the standard protocol following bolus administration of intravenous contrast. CONTRAST:  166mL OMNIPAQUE IOHEXOL 300 MG/ML  SOLN COMPARISON:  05/14/2012 FINDINGS: Lower Chest: Increased linear opacity in both lung bases since 2014, which may be due to subsegmental atelectasis or scarring. Hepatobiliary: No hepatic masses identified. Prior cholecystectomy. No evidence of biliary obstruction. Pancreas:  No mass or inflammatory changes. Spleen: Within normal limits in size and appearance. Adrenals/Urinary Tract: No masses identified. Several small left renal cysts again noted. No evidence of hydronephrosis. Stomach/Bowel: Large paraumbilical ventral hernia is again seen containing multiple small bowel loops. Moderate dilatation of small bowel is seen within the hernia and proximal to the hernia, consistent with small-bowel obstruction. No evidence of small bowel wall thickening or pneumatosis. No evidence of inflammatory process or abnormal fluid collections. Diverticulosis is seen mainly involving the descending and sigmoid colon, however there is no evidence of diverticulitis. Vascular/Lymphatic: No pathologically enlarged lymph nodes. No abdominal aortic aneurysm.  Aortic atherosclerosis. Reproductive:  No mass or other significant abnormality. Other:  None. Musculoskeletal:  No suspicious bone lesions identified. IMPRESSION: 1. Small bowel obstruction due to large paraumbilical ventral hernia, which contains multiple small bowel loops. No signs of bowel strangulation. 2. Colonic diverticulosis, without radiographic evidence of diverticulitis. Aortic Atherosclerosis (ICD10-I70.0). Electronically Signed   By: Marlaine Hind M.D.   On: 01/13/2019 18:50   Dg Chest Port 1 View  Result Date: 01/17/2019 CLINICAL DATA:  Acute respiratory failure. EXAM: PORTABLE CHEST 1 VIEW COMPARISON:  Radiograph of January 16, 2019. FINDINGS: Stable cardiomediastinal silhouette. No pneumothorax is noted. Stable bibasilar atelectasis or infiltrates are noted. Small bilateral pleural effusions are noted as well. Bony thorax unremarkable. IMPRESSION: Stable bibasilar opacities as described above. Electronically Signed   By: Marijo Conception M.D.   On:  01/17/2019 07:36   Dg Chest Port 1 View  Result Date: 01/16/2019 CLINICAL DATA:  Hypoxia today. EXAM: PORTABLE CHEST 1 VIEW COMPARISON:  Single-view of the chest 01/13/2019. PA and lateral chest 09/25/2010. CT chest 10/03/2010. FINDINGS: NG tube has been removed. There is cardiomegaly and interstitial edema. Right pleural effusion and basilar airspace disease have increased. Left basilar airspace disease is mildly improved. No pneumothorax. IMPRESSION: No marked change in cardiomegaly and interstitial edema. Increased small right pleural effusion and basilar atelectasis. Left pleural effusion is slightly decreased. Electronically Signed   By: Inge Rise M.D.   On: 01/16/2019 09:34   Dg Chest Portable 1 View  Result Date: 01/13/2019 CLINICAL DATA:  NG tube placement EXAM: PORTABLE CHEST 1 VIEW COMPARISON:  Radiograph 01/13/2019 FINDINGS: Interval placement of a transesophageal tube with tip and side port distal to the GE junction, curling  within the gastric lumen in the left upper quadrant. Redemonstration of the diffuse interstitial opacities and moderate cardiomegaly. No pneumothorax. No new consolidative process. No acute osseous or soft tissue abnormality. Degenerative changes are present in the imaged spine and shoulders. IMPRESSION: 1. Appropriate positioning of the transesophageal tube with the tip and side port distal to the GE junction 2. Slightly diminished lung volumes with persistent interstitial pulmonary edema and cardiomegaly. Electronically Signed   By: Lovena Le M.D.   On: 01/13/2019 20:15   Dg Chest Portable 1 View  Result Date: 01/13/2019 CLINICAL DATA:  Cough. Nausea and vomiting. Right-sided abdominal pain. EXAM: PORTABLE CHEST 1 VIEW COMPARISON:  09/25/2010 FINDINGS: Mild-to-moderate cardiomegaly. Diffuse interstitial infiltrates, suspicious for interstitial edema. Stable linear opacity in right lower lung, consistent with scarring. No evidence of pulmonary consolidation or pleural effusion. IMPRESSION: Cardiomegaly and diffuse interstitial infiltrates, suspicious for mild congestive heart failure. Electronically Signed   By: Marlaine Hind M.D.   On: 01/13/2019 16:41    Critical care time spent 30 mins  Clanford Wynetta Emery, MD [facility] How to contact the [facility] Attending or Consulting provider [place] or covering provider during after hours [place], for this patient?  1. Check the care team in [facility] and look for a) attending/consulting [facility] provider listed and b) the [facility] team listed 2. Log into [email or web address] and use [place]'s universal password to access. If you do not have the password, please contact the hospital operator. 3. Locate the [facility] provider you are looking for under [facility] and page to a number that you can be directly reached. 4. If you still have difficulty reaching the provider, please page the [facility] (Director on Call) for the Hospitalists listed on amion for assistance.   If  7PM-7AM, please contact night-coverage [email or web address] Password [email or web address] 01/19/2019, 9:08 AM   LOS: 5 days

## 2019-01-20 ENCOUNTER — Encounter (HOSPITAL_COMMUNITY): Payer: Self-pay | Admitting: Radiology

## 2019-01-20 ENCOUNTER — Inpatient Hospital Stay (HOSPITAL_COMMUNITY): Payer: Self-pay

## 2019-01-20 NOTE — Progress Notes (Signed)
PROGRESS NOTE  Veronica Horton DQQ:[id number] DOB: 1960/07/30 DOA: 01/13/2019 PCP: Sharilyn Sites, MD  Brief History:  58 y/o female with history of COPD, HTN, tobacco abuse, morbid obesity presenting with abdominal pain with nausea and vomiting that started evening 01/12/19 and progressively worsened throughout the day on 01/13/19.  She also has had increasing cough with clear sputum x 1-2 days.  She denies f/c, cp, hemoptysis, dysuria, hematuria, hematochezia, melena.  She had some dyspnea on exertion, but stated it was not any worse than usual.  Her last BM was on 01/12/19.  She denies any new medications, but continues to smoke 1ppd.   In the ED, she was afebrile and hemodynamically stable.  BMP, LFTs and CBC were essentially unremarkable except some polycythemia and WBC 11.3.  The patient was afebrile hemodynamically stable but was hypoxic with oxygen saturation 87% on room air.  CT of abd/pelvis showed SBO secondary to a large paraumbilical ventral hernia without strangulation.  NG was placed for decompression.  General surgery was consulted to assist.  Assessment/Plan: SBO - RESOLVED -POD#5 s/p incisional herniorrhaphy with mesh omentectomy - Pt is recovering well. Diet per Dr. Arnoldo Morale.  Having bowel movements.    Acute on chronic respiratory failure with hypoxia -Secondary to COPD exacerbation with a component of aspiration pneumonitis -Increasing oxygen requirement, ABG with hypoxemia pO2 54, started bipap, now off bipap. -CT chest - no PE but significant atelectasis and mucus plugging and aspirate seen distal trachea. -Suspect she has chronic hypoxemia.  She is totally asymptomatic with a pulse ox in low to mid 80s.  Appreciate pulmonology consult. Goal is for pulse ox of 88%.    Aspiration Pneumonia - completed course of antibiotics.      COPD exacerbation -Continue Pulmicort -Increased duo nebs to every 4 hours -Given IV solumedrol 9/16 with good results  Essential  hypertension -Hydralazine as needed SBP>180  Peripheral neuropathy -gabapentin taken at home as needed  Disposition Plan:   Home when surgically cleared  Family Communication:  Patient updated at bedside  Consultants:  General surgery, pulmonology  Code Status:  FULL   DVT Prophylaxis:  [place] Heparin   Procedures: As Listed in Progress Note Above  Antibiotics: None  Subjective: Patient gonna try to ambulate more today.    Objective: Vitals:   01/20/19 0700 01/20/19 0735 01/20/19 0811 01/20/19 0813  BP: 138/71     Pulse: 65 72    Resp: 17 19    Temp:  99.2 F (37.3 C)    TempSrc:  Oral    SpO2: 90% 92% 93% 95%  Weight:      Height:        Intake/Output Summary (Last 24 hours) at 01/20/2019 0912 Last data filed at 01/20/2019 1941 Gross per 24 hour  Intake 152.44 ml  Output 465 ml  Net -312.56 ml   Weight change: 1 kg Exam:   General:  Pt is alert, follows commands appropriately, not in acute distress  HEENT: No icterus, No thrush, No neck mass, [place]/AT  Cardiovascular: normal S1/S2, no rubs, no gallops  Respiratory: BBS fairly clear to ausculation with bibasilar exp wheezes.  Abdomen: wounds clean and dry and healing. Soft/+BS,  no guarding  Extremities: trace LE edema, No lymphangitis, No petechiae, No rashes, no synovitis  Data Reviewed: I have personally reviewed following labs and imaging studies Basic Metabolic Panel: Recent Labs  Lab 01/14/19 0447 01/15/19 0443 01/16/19 0414 01/17/19 0456 01/18/19 0501  NA 143 139 138 137 141  K 4.1 4.7 5.1 4.1 4.1  CL 104 102 105 102 103  CO2 30 26 26 25 30   GLUCOSE 95 162* 128* 89 97  BUN 18 21* 30* 30* 20  CREATININE 0.97 0.93 1.15* 1.01* 0.92  CALCIUM 9.0 9.1 8.8* 8.4* 8.5*  MG  --  2.4 2.4 2.3 2.4  PHOS  --   --  4.5  --   --    Liver Function Tests: Recent Labs  Lab 01/13/19 1640 01/18/19 0501  AST 18 14*  ALT 16 16  ALKPHOS 83 52  BILITOT 1.1 1.0  PROT 8.2* 6.1*  ALBUMIN 4.3 3.0*    Recent Labs  Lab 01/13/19 1640  LIPASE 23   No results for input(s): AMMONIA in the last 168 hours. Coagulation Profile: No results for input(s): INR, PROTIME in the last 168 hours. CBC: Recent Labs  Lab 01/13/19 1640 01/14/19 0447 01/15/19 0443 01/16/19 0414 01/17/19 0456 01/18/19 0501  WBC 11.3* 10.3 14.7* 24.4* 14.2* 11.7*  NEUTROABS 10.1* 7.3  --   --   --  9.2*  HGB 18.1* 16.9* 16.4* 15.3* 14.9 14.0  HCT 56.8* 54.0* 51.6* 48.2* 46.9* 44.4  MCV 100.4* 103.1* 102.8* 101.7* 102.2* 102.1*  PLT 226 233 156 228 191 184   Cardiac Enzymes: No results for input(s): CKTOTAL, CKMB, CKMBINDEX, TROPONINI in the last 168 hours. BNP: Invalid input(s): POCBNP CBG: No results for input(s): GLUCAP in the last 168 hours. HbA1C: No results for input(s): HGBA1C in the last 72 hours. Urine analysis:    Component Value Date/Time   COLORURINE YELLOW 01/13/2019 1730   APPEARANCEUR CLOUDY (A) 01/13/2019 1730   LABSPEC 1.017 01/13/2019 1730   PHURINE 8.0 01/13/2019 1730   GLUCOSEU NEGATIVE 01/13/2019 1730   HGBUR NEGATIVE 01/13/2019 1730   BILIRUBINUR NEGATIVE 01/13/2019 1730   KETONESUR NEGATIVE 01/13/2019 1730   PROTEINUR 100 (A) 01/13/2019 1730   UROBILINOGEN 0.2 09/25/2010 0530   NITRITE NEGATIVE 01/13/2019 1730   LEUKOCYTESUR NEGATIVE 01/13/2019 1730    Recent Results (from the past 240 hour(s))  SARS Coronavirus 2 [facility] order, Performed in [facility] hospital lab) Nasopharyngeal Nasopharyngeal Swab     Status: None   Collection Time: 01/13/19  3:59 PM   Specimen: Nasopharyngeal Swab  Result Value Ref Range Status   SARS Coronavirus 2 NEGATIVE NEGATIVE Final    Comment: (NOTE) If result is NEGATIVE SARS-CoV-2 target nucleic acids are NOT DETECTED. The SARS-CoV-2 RNA is generally detectable in upper and lower  respiratory specimens during the acute phase of infection. The lowest  concentration of SARS-CoV-2 viral copies this assay can detect is 250  copies / mL. A  negative result does not preclude SARS-CoV-2 infection  and should not be used as the sole basis for treatment or other  patient management decisions.  A negative result may occur with  improper specimen collection / handling, submission of specimen other  than nasopharyngeal swab, presence of viral mutation(s) within the  areas targeted by this assay, and inadequate number of viral copies  (<250 copies / mL). A negative result must be combined with clinical  observations, patient history, and epidemiological information. If result is POSITIVE SARS-CoV-2 target nucleic acids are DETECTED. The SARS-CoV-2 RNA is generally detectable in upper and lower  respiratory specimens dur ing the acute phase of infection.  Positive  results are indicative of active infection with SARS-CoV-2.  Clinical  correlation with patient history and other diagnostic information is  necessary to determine patient infection status.  Positive results do  not rule out bacterial infection or co-infection with other viruses. If result is PRESUMPTIVE POSTIVE SARS-CoV-2 nucleic acids MAY BE PRESENT.   A presumptive positive result was obtained on the submitted specimen  and confirmed on repeat testing.  While 2019 novel coronavirus  (SARS-CoV-2) nucleic acids may be present in the submitted sample  additional confirmatory testing may be necessary for epidemiological  and / or clinical management purposes  to differentiate between  SARS-CoV-2 and other Sarbecovirus currently known to infect humans.  If clinically indicated additional testing with an alternate test  methodology [phone]) is advised. The SARS-CoV-2 RNA is generally  detectable in upper and lower respiratory sp ecimens during the acute  phase of infection. The expected result is Negative. Fact Sheet for Patients:  StrictlyIdeas.no Fact Sheet for Healthcare Providers: BankingDealers.co.za This test is not  yet approved or cleared by the [place] FDA and has been authorized for detection and/or diagnosis of SARS-CoV-2 by FDA under an Emergency Use Authorization (EUA).  This EUA will remain in effect (meaning this test can be used) for the duration of the COVID-19 declaration under Section 564(b)(1) of the Act, 21 U.S.C. section 360bbb-3(b)(1), unless the authorization is terminated or revoked sooner. Performed at [facility], [address] [place], [place] [address]   Surgical pcr screen     Status: None   Collection Time: 01/14/19  7:41 PM   Specimen: Nasal Mucosa; Nasal Swab  Result Value Ref Range Status   MRSA, PCR NEGATIVE NEGATIVE Final   Staphylococcus aureus NEGATIVE NEGATIVE Final    Comment: (NOTE) The Xpert SA Assay (FDA approved for NASAL specimens in patients 26 years of age and older), is one component of a comprehensive surveillance program. It is not intended to diagnose infection nor to guide or monitor treatment. Performed at [facility], [address], [place], [place] [address]   MRSA PCR Screening     Status: None   Collection Time: 01/15/19  2:15 PM   Specimen: Nasal Mucosa; Nasopharyngeal  Result Value Ref Range Status   MRSA by PCR NEGATIVE NEGATIVE Final    Comment:        The GeneXpert MRSA Assay (FDA approved for NASAL specimens only), is one component of a comprehensive MRSA colonization surveillance program. It is not intended to diagnose MRSA infection nor to guide or monitor treatment for MRSA infections. Performed at [facility], [address] [place], [place] [address]      Scheduled Meds: . budesonide (PULMICORT) nebulizer solution  0.5 mg Nebulization BID  . Chlorhexidine Gluconate Cloth  6 each Topical Daily  . enoxaparin (LOVENOX) injection  40 mg Subcutaneous Q24H  . guaiFENesin  1,200 mg Oral BID  . ipratropium-albuterol  3 mL Nebulization Q4H  . lisinopril  10 mg Oral Daily  . magnesium hydroxide  30 mL Oral BID  . mouth  rinse  15 mL Mouth Rinse BID  . polyethylene glycol  17 g Oral BID  . saccharomyces boulardii  250 mg Oral BID  . sodium chloride flush  3 mL Intravenous Q12H   Continuous Infusions: . sodium chloride      Procedures/Studies: Ct Angio Chest Pe W Or Wo Contrast  Result Date: 01/17/2019 CLINICAL DATA:  Dyspnea chronically. Two days postop small-bowel obstruction and hernia repair. EXAM: CT ANGIOGRAPHY CHEST WITH CONTRAST TECHNIQUE: Multidetector CT imaging of the chest was performed using the standard protocol during bolus administration of intravenous contrast.  Multiplanar CT image reconstructions and MIPs were obtained to evaluate the vascular anatomy. CONTRAST:  167mL OMNIPAQUE IOHEXOL 350 MG/ML SOLN COMPARISON:  Chest CT 10/03/2010 and abdominal CT 01/13/2019 FINDINGS: Cardiovascular: Mild cardiomegaly. Minimal calcified plaque over the left anterior descending coronary artery. Thoracic aorta is normal. Pulmonary arterial system is well opacified without evidence of emboli. Aberrant right subclavian artery. Remaining vascular structures are unremarkable. Mediastinum/Nodes: No significant mediastinal or hilar adenopathy. Lungs/Pleura: Lungs are adequately inflated demonstrate moderate consolidation over the lower lobes bilaterally likely atelectasis. Mild atelectatic change over the right middle lobe and lingula. Subtle dependent atelectasis over the posterior right upper lobe. No significant effusion. Dependent debris over the distal trachea and right mainstem bronchus likely aspirate material. Obstructed right posterior lower lobe bronchus which may be due to mucous plugging versus aspiration. Narrowed right lower lobe bronchi. Upper Abdomen: No acute findings. Musculoskeletal: Degenerative change of the spine. Review of the MIP images confirms the above findings. IMPRESSION: 1.  No evidence of pulmonary embolism. 2. Significant bilateral lower lobe consolidation likely atelectasis. Mild atelectasis  over the right middle lobe and lingula as well as minimal dependent atelectasis over the right upper lobe. No effusion. Aspirate material over the distal trachea and right mainstem bronchus. 3. Cardiomegaly with minimal atherosclerotic coronary artery disease. Electronically Signed   By: Marin Olp M.D.   On: 01/17/2019 10:12   Ct Abdomen Pelvis W Contrast  Result Date: 01/13/2019 CLINICAL DATA:  Right-sided abdominal pain and nausea and vomiting beginning last night. Prior cholecystectomy. EXAM: CT ABDOMEN AND PELVIS WITH CONTRAST TECHNIQUE: Multidetector CT imaging of the abdomen and pelvis was performed using the standard protocol following bolus administration of intravenous contrast. CONTRAST:  171mL OMNIPAQUE IOHEXOL 300 MG/ML  SOLN COMPARISON:  05/14/2012 FINDINGS: Lower Chest: Increased linear opacity in both lung bases since 2014, which may be due to subsegmental atelectasis or scarring. Hepatobiliary: No hepatic masses identified. Prior cholecystectomy. No evidence of biliary obstruction. Pancreas:  No mass or inflammatory changes. Spleen: Within normal limits in size and appearance. Adrenals/Urinary Tract: No masses identified. Several small left renal cysts again noted. No evidence of hydronephrosis. Stomach/Bowel: Large paraumbilical ventral hernia is again seen containing multiple small bowel loops. Moderate dilatation of small bowel is seen within the hernia and proximal to the hernia, consistent with small-bowel obstruction. No evidence of small bowel wall thickening or pneumatosis. No evidence of inflammatory process or abnormal fluid collections. Diverticulosis is seen mainly involving the descending and sigmoid colon, however there is no evidence of diverticulitis. Vascular/Lymphatic: No pathologically enlarged lymph nodes. No abdominal aortic aneurysm. Aortic atherosclerosis. Reproductive:  No mass or other significant abnormality. Other:  None. Musculoskeletal:  No suspicious bone lesions  identified. IMPRESSION: 1. Small bowel obstruction due to large paraumbilical ventral hernia, which contains multiple small bowel loops. No signs of bowel strangulation. 2. Colonic diverticulosis, without radiographic evidence of diverticulitis. Aortic Atherosclerosis (ICD10-I70.0). Electronically Signed   By: Marlaine Hind M.D.   On: 01/13/2019 18:50   Dg Chest Port 1 View  Result Date: 01/17/2019 CLINICAL DATA:  Acute respiratory failure. EXAM: PORTABLE CHEST 1 VIEW COMPARISON:  Radiograph of January 16, 2019. FINDINGS: Stable cardiomediastinal silhouette. No pneumothorax is noted. Stable bibasilar atelectasis or infiltrates are noted. Small bilateral pleural effusions are noted as well. Bony thorax unremarkable. IMPRESSION: Stable bibasilar opacities as described above. Electronically Signed   By: Marijo Conception M.D.   On: 01/17/2019 07:36   Dg Chest Port 1 View  Result Date: 01/16/2019 CLINICAL DATA:  Hypoxia today. EXAM: PORTABLE CHEST 1 VIEW COMPARISON:  Single-view of the chest 01/13/2019. PA and lateral chest 09/25/2010. CT chest 10/03/2010. FINDINGS: NG tube has been removed. There is cardiomegaly and interstitial edema. Right pleural effusion and basilar airspace disease have increased. Left basilar airspace disease is mildly improved. No pneumothorax. IMPRESSION: No marked change in cardiomegaly and interstitial edema. Increased small right pleural effusion and basilar atelectasis. Left pleural effusion is slightly decreased. Electronically Signed   By: Inge Rise M.D.   On: 01/16/2019 09:34   Dg Chest Portable 1 View  Result Date: 01/13/2019 CLINICAL DATA:  NG tube placement EXAM: PORTABLE CHEST 1 VIEW COMPARISON:  Radiograph 01/13/2019 FINDINGS: Interval placement of a transesophageal tube with tip and side port distal to the GE junction, curling within the gastric lumen in the left upper quadrant. Redemonstration of the diffuse interstitial opacities and moderate cardiomegaly. No  pneumothorax. No new consolidative process. No acute osseous or soft tissue abnormality. Degenerative changes are present in the imaged spine and shoulders. IMPRESSION: 1. Appropriate positioning of the transesophageal tube with the tip and side port distal to the GE junction 2. Slightly diminished lung volumes with persistent interstitial pulmonary edema and cardiomegaly. Electronically Signed   By: Lovena Le M.D.   On: 01/13/2019 20:15   Dg Chest Portable 1 View  Result Date: 01/13/2019 CLINICAL DATA:  Cough. Nausea and vomiting. Right-sided abdominal pain. EXAM: PORTABLE CHEST 1 VIEW COMPARISON:  09/25/2010 FINDINGS: Mild-to-moderate cardiomegaly. Diffuse interstitial infiltrates, suspicious for interstitial edema. Stable linear opacity in right lower lung, consistent with scarring. No evidence of pulmonary consolidation or pleural effusion. IMPRESSION: Cardiomegaly and diffuse interstitial infiltrates, suspicious for mild congestive heart failure. Electronically Signed   By: Marlaine Hind M.D.   On: 01/13/2019 16:41   Critical care time spent 30 mins   Wynetta Emery, MD [facility] How to contact the [facility] Attending or Consulting provider [place] or covering provider during after hours [place], for this patient?  1. Check the care team in [facility] and look for a) attending/consulting [facility] provider listed and b) the [facility] team listed 2. Log into [email or web address] and use [place]'s universal password to access. If you do not have the password, please contact the hospital operator. 3. Locate the [facility] provider you are looking for under [facility] and page to a number that you can be directly reached. 4. If you still have difficulty reaching the provider, please page the [facility] (Director on Call) for the Hospitalists listed on amion for assistance.   If 7PM-7AM, please contact night-coverage [email or web address] Password [email or web address] 01/20/2019, 9:12 AM   LOS: 6 days

## 2019-01-20 NOTE — Progress Notes (Signed)
Subjective: She says she feels okay.  She is still coughing.  She was able to sit up yesterday.  She remains on high flow nasal cannula  Objective: Vital signs in last 24 hours: Temp:  [98.3 F (36.8 C)-99.6 F (37.6 C)] 99.2 F (37.3 C) (09/21 0735) Pulse Rate:  [62-108] 72 (09/21 0735) Resp:  [14-27] 19 (09/21 0735) BP: (120-153)/(57-81) 138/71 (09/21 0700) SpO2:  [83 %-96 %] 95 % (09/21 0813) FiO2 (%):  [40 %-80 %] 65 % (09/21 0811) Weight:  [135.6 kg] 135.6 kg (09/21 0500) Weight change: 1 kg Last BM Date: 01/18/19  Intake/Output from previous day: 09/20 0701 - 09/21 0700 In: 277.4 [P.O.:120; I.V.:5; IV Piggyback:152.4] Out: 515 [Urine:400; Drains:115]  PHYSICAL EXAM General appearance: alert, cooperative and no distress Resp: rhonchi bilaterally Cardio: regular rate and rhythm, S1, S2 normal, no murmur, click, rub or gallop GI: soft, non-tender; bowel sounds normal; no masses,  no organomegaly Extremities: extremities normal, atraumatic, no cyanosis or edema  Lab Results:  No results found for this or any previous visit (from the past 48 hour(s)).  ABGS No results for input(s): PHART, PO2ART, TCO2, HCO3 in the last 72 hours.  Invalid input(s): PCO2 CULTURES Recent Results (from the past 240 hour(s))  SARS Coronavirus 2 [facility] order, Performed in [facility] hospital lab) Nasopharyngeal Nasopharyngeal Swab     Status: None   Collection Time: 01/13/19  3:59 PM   Specimen: Nasopharyngeal Swab  Result Value Ref Range Status   SARS Coronavirus 2 NEGATIVE NEGATIVE Final    Comment: (NOTE) If result is NEGATIVE SARS-CoV-2 target nucleic acids are NOT DETECTED. The SARS-CoV-2 RNA is generally detectable in upper and lower  respiratory specimens during the acute phase of infection. The lowest  concentration of SARS-CoV-2 viral copies this assay can detect is 250  copies / mL. A negative result does not preclude SARS-CoV-2 infection  and should not be used as the  sole basis for treatment or other  patient management decisions.  A negative result may occur with  improper specimen collection / handling, submission of specimen other  than nasopharyngeal swab, presence of viral mutation(s) within the  areas targeted by this assay, and inadequate number of viral copies  (<250 copies / mL). A negative result must be combined with clinical  observations, patient history, and epidemiological information. If result is POSITIVE SARS-CoV-2 target nucleic acids are DETECTED. The SARS-CoV-2 RNA is generally detectable in upper and lower  respiratory specimens dur ing the acute phase of infection.  Positive  results are indicative of active infection with SARS-CoV-2.  Clinical  correlation with patient history and other diagnostic information is  necessary to determine patient infection status.  Positive results do  not rule out bacterial infection or co-infection with other viruses. If result is PRESUMPTIVE POSTIVE SARS-CoV-2 nucleic acids MAY BE PRESENT.   A presumptive positive result was obtained on the submitted specimen  and confirmed on repeat testing.  While 2019 novel coronavirus  (SARS-CoV-2) nucleic acids may be present in the submitted sample  additional confirmatory testing may be necessary for epidemiological  and / or clinical management purposes  to differentiate between  SARS-CoV-2 and other Sarbecovirus currently known to infect humans.  If clinically indicated additional testing with an alternate test  methodology [phone]) is advised. The SARS-CoV-2 RNA is generally  detectable in upper and lower respiratory sp ecimens during the acute  phase of infection. The expected result is Negative. Fact Sheet for Patients:  StrictlyIdeas.no Fact Sheet  for Healthcare Providers: BankingDealers.co.za This test is not yet approved or cleared by the [place] and has been authorized for detection  and/or diagnosis of SARS-CoV-2 by FDA under an Emergency Use Authorization (EUA).  This EUA will remain in effect (meaning this test can be used) for the duration of the COVID-19 declaration under Section 564(b)(1) of the Act, 21 U.S.C. section 360bbb-3(b)(1), unless the authorization is terminated or revoked sooner. Performed at [facility], [address], [place], [place] [address]   Surgical pcr screen     Status: None   Collection Time: 01/14/19  7:41 PM   Specimen: Nasal Mucosa; Nasal Swab  Result Value Ref Range Status   MRSA, PCR NEGATIVE NEGATIVE Final   Staphylococcus aureus NEGATIVE NEGATIVE Final    Comment: (NOTE) The Xpert SA Assay (FDA approved for NASAL specimens in patients 41 years of age and older), is one component of a comprehensive surveillance program. It is not intended to diagnose infection nor to guide or monitor treatment. Performed at [facility], [address], [place], [place] [address]   MRSA PCR Screening     Status: None   Collection Time: 01/15/19  2:15 PM   Specimen: Nasal Mucosa; Nasopharyngeal  Result Value Ref Range Status   MRSA by PCR NEGATIVE NEGATIVE Final    Comment:        The GeneXpert MRSA Assay (FDA approved for NASAL specimens only), is one component of a comprehensive MRSA colonization surveillance program. It is not intended to diagnose MRSA infection nor to guide or monitor treatment for MRSA infections. Performed at [facility], [address] [place], [place] [address]    Studies/Results: No results found.  Medications:  Prior to Admission:  Medications Prior to Admission  Medication Sig Dispense Refill Last Dose  . albuterol (PROVENTIL) (2.5 MG/3ML) 0.083% nebulizer solution Take 2.5 mg by nebulization every 6 (six) hours as needed for wheezing or shortness of breath.   unknown  . albuterol (VENTOLIN HFA) 108 (90 Base) MCG/ACT inhaler Inhale 1-2 puffs into the lungs every 6 (six) hours as needed for wheezing or  shortness of breath.   unknown  . cyclobenzaprine (FLEXERIL) 10 MG tablet Take 10 mg by mouth daily as needed for muscle spasms.   Past Month at Unknown time  . diphenhydrAMINE HCl (ALLERGY MEDICATION PO) Take 1 tablet by mouth at bedtime.   01/12/2019 at Unknown time  . gabapentin (NEURONTIN) 300 MG capsule Take 600 mg by mouth at bedtime.    01/12/2019 at Unknown time  . ibuprofen (ADVIL) 200 MG tablet Take 400 mg by mouth at bedtime.   01/12/2019 at Unknown time  . lisinopril-hydrochlorothiazide (ZESTORETIC) 10-12.5 MG tablet Take 1 tablet by mouth every morning.   01/13/2019 at Unknown time   Scheduled: . budesonide (PULMICORT) nebulizer solution  0.5 mg Nebulization BID  . Chlorhexidine Gluconate Cloth  6 each Topical Daily  . enoxaparin (LOVENOX) injection  40 mg Subcutaneous Q24H  . guaiFENesin  1,200 mg Oral BID  . ipratropium-albuterol  3 mL Nebulization Q4H  . lisinopril  10 mg Oral Daily  . magnesium hydroxide  30 mL Oral BID  . mouth rinse  15 mL Mouth Rinse BID  . polyethylene glycol  17 g Oral BID  . saccharomyces boulardii  250 mg Oral BID  . sodium chloride flush  3 mL Intravenous Q12H   Continuous: . sodium chloride     YKD:XIPJAS chloride, acetaminophen **OR** acetaminophen, diphenhydrAMINE **OR** diphenhydrAMINE, fentaNYL (SUBLIMAZE) injection,  guaiFENesin-dextromethorphan, ipratropium-albuterol, labetalol, LORazepam, ondansetron **OR** ondansetron (ZOFRAN) IV, polyvinyl alcohol, simethicone, sodium chloride flush, traMADol  Assesment: She was admitted with small bowel obstruction that was related to her hernia.  She has had hernia repair.  At baseline she has COPD.  She has acute hypoxic respiratory failure and I suspect that she also has chronic hypoxic respiratory failure that has just not been diagnosed yet.  While she was having trouble with nausea vomiting from her small bowel obstruction she aspirated and she is being treated for aspiration pneumonia.  She is improving  but is now on 65% oxygen. Principal Problem:   Small bowel obstruction (HCC) Active Problems:   COPD (chronic obstructive pulmonary disease) (HCC)   Hypertension   Acute respiratory failure with hypoxia (HCC)   Polycythemia   COPD with acute exacerbation (HCC)   Incisional hernia, without obstruction or gangrene   Aspiration pneumonitis ([place])    Plan: Continue current treatments.  Goal oxygen saturation for her should be around 88%.    LOS: 6 days   Alonza Bogus 01/20/2019, 8:41 AM

## 2019-01-20 NOTE — Progress Notes (Signed)
Physical Therapy Treatment Patient Details Name: Veronica Horton MRN: [id number] DOB: 1960-06-10 Today's Date: 01/20/2019    History of Present Illness Veronica Horton is a 58 y.o. female with medical history significant for COPD, hypertension, hiatal hernia, and history of cholecystectomy, now presenting to the emergency department for evaluation of abdominal pain with nausea and vomiting.  Patient reports that she been in her usual state of health until last night when she developed abdominal pain with nausea and vomiting.  Pain and nausea/vomiting persisted throughout the night and through today, not necessarily worsening, but not improving either.  She also had a marked increase in her chronic cough last night accompanied by thick sputum production.  She does not feel particularly short of breath.  She has not noted any lower extremity swelling or tenderness.  She denies any fevers or chills.  She has not had these symptoms previously.  She denies diarrhea, melena, or hematochezia.    PT Comments    Patient presents seated in chair and agreeable for therapy.  Patient demonstrates good return for completing BLE ROM/strengthening exercises, tolerated taking steps forward/backwards at bedside, limited secondary to c/o fatigue and on 35.2 LPM with SpO2 between 85-89%.  Patient continued sitting up in chair after therapy.  Patient will benefit from continued physical therapy in hospital and recommended venue below to increase strength, balance, endurance for safe ADLs and gait.    Follow Up Recommendations  Home health PT;Supervision - Intermittent     Equipment Recommendations  3in1 (PT)    Recommendations for Other Services       Precautions / Restrictions Precautions Precautions: Fall Restrictions Weight Bearing Restrictions: No    Mobility  Bed Mobility               General bed mobility comments: Patient presents in chair (assisted by nursing staff)  Transfers Overall  transfer level: Needs assistance Equipment used: Rolling walker (2 wheeled) Transfers: Sit to/from Stand;Stand Pivot Transfers Sit to Stand: Supervision;Min guard Stand pivot transfers: Supervision;Min guard       General transfer comment: increased time, slightly labored movement  Ambulation/Gait Ambulation/Gait assistance: Min guard Gait Distance (Feet): 15 Feet Assistive device: Rolling walker (2 wheeled) Gait Pattern/deviations: Decreased step length - right;Decreased step length - left;Decreased stride length Gait velocity: decreased   General Gait Details: limited to 15-16 slow slilghtly labored forward/backward steps at bedside while on 35.2 LPM high flow O2 with SpO2 between 85-89%   Stairs             Wheelchair Mobility    Modified Rankin (Stroke Patients Only)       Balance Overall balance assessment: Needs assistance Sitting-balance support: Feet supported Sitting balance-Leahy Scale: Good Sitting balance - Comments: seated in chair   Standing balance support: During functional activity;Bilateral upper extremity supported Standing balance-Leahy Scale: Fair Standing balance comment: fair/good using RW                            Cognition Arousal/Alertness: Awake/alert Behavior During Therapy: WFL for tasks assessed/performed Overall Cognitive Status: Within Functional Limits for tasks assessed                                        Exercises General Exercises - Lower Extremity Long Arc Quad: Seated;AROM;Strengthening;Both;10 reps Hip Flexion/Marching: Seated;AROM;Strengthening;Both;10 reps Toe Raises: Seated;AROM;Strengthening;Both;10 reps Heel Raises:  Seated;AROM;Strengthening;Both;10 reps    General Comments        Pertinent Vitals/Pain Pain Assessment: No/denies pain    Home Living                      Prior Function            PT Goals (current goals can now be found in the care plan section)  Acute Rehab PT Goals Patient Stated Goal: return home PT Goal Formulation: With patient Time For Goal Achievement: 01/23/19 Potential to Achieve Goals: Good Progress towards PT goals: Progressing toward goals    Frequency    Min 3X/week      PT Plan Current plan remains appropriate    Co-evaluation              AM-PAC PT "6 Clicks" Mobility   Outcome Measure  Help needed turning from your back to your side while in a flat bed without using bedrails?: None Help needed moving from lying on your back to sitting on the side of a flat bed without using bedrails?: None Help needed moving to and from a bed to a chair (including a wheelchair)?: A Little Help needed standing up from a chair using your arms (e.g., wheelchair or bedside chair)?: A Little Help needed to walk in hospital room?: A Little Help needed climbing 3-5 steps with a railing? : A Lot 6 Click Score: 19    End of Session Equipment Utilized During Treatment: Oxygen Activity Tolerance: Patient tolerated treatment well Patient left: in chair;with call bell/phone within reach Nurse Communication: Mobility status PT Visit Diagnosis: Unsteadiness on feet (R26.81);Other abnormalities of gait and mobility (R26.89);Muscle weakness (generalized) (M62.81)     Time: 1027-1050 PT Time Calculation (min) (ACUTE ONLY): 23 min  Charges:  $Therapeutic Exercise: 8-22 mins $Therapeutic Activity: 8-22 mins                     3:03 PM, 01/20/19 Lonell Grandchild, MPT Physical Therapist with [facility] [phone] office [phone] mobile phone

## 2019-01-20 NOTE — Progress Notes (Signed)
5 Days Post-Op  Subjective: No complaints of abdominal pain.  Tolerating diet well.  Objective: Vital signs in last 24 hours: Temp:  [97.7 F (36.5 C)-99.6 F (37.6 C)] 97.7 F (36.5 C) (09/21 1110) Pulse Rate:  [62-108] 70 (09/21 1110) Resp:  [14-27] 21 (09/21 1110) BP: (120-153)/(55-81) 134/71 (09/21 1003) SpO2:  [83 %-96 %] 94 % (09/21 1110) FiO2 (%):  [40 %-80 %] 65 % (09/21 0811) Weight:  [135.6 kg] 135.6 kg (09/21 0500) Last BM Date: 01/20/19  Intake/Output from previous day: 09/20 0701 - 09/21 0700 In: 277.4 [P.O.:120; I.V.:5; IV Piggyback:152.4] Out: 515 [Urine:400; Drains:115] Intake/Output this shift: No intake/output data recorded.  General appearance: alert, cooperative and no distress GI: Soft, incision healing well.  JP drainage serosanguineous in nature.  Lab Results:  Recent Labs    01/18/19 0501  WBC 11.7*  HGB 14.0  HCT 44.4  PLT 184   BMET Recent Labs    01/18/19 0501  NA 141  K 4.1  CL 103  CO2 30  GLUCOSE 97  BUN 20  CREATININE 0.92  CALCIUM 8.5*   PT/INR No results for input(s): LABPROT, INR in the last 72 hours.  Studies/Results: No results found.  Anti-infectives: Anti-infectives (From admission, onward)   Start     Dose/Rate Route Frequency Ordered Stop   01/17/19 1400  clindamycin (CLEOCIN) IVPB 600 mg     600 mg 100 mL/hr over 30 Minutes Intravenous Every 8 hours 01/17/19 1314 01/20/19 0558   01/17/19 1200  clindamycin (CLEOCIN) IVPB 300 mg  Status:  Discontinued     300 mg 100 mL/hr over 30 Minutes Intravenous Every 6 hours 01/17/19 1159 01/17/19 1159   01/17/19 1200  clindamycin (CLEOCIN) IVPB 300 mg  Status:  Discontinued     300 mg 100 mL/hr over 30 Minutes Intravenous Every 6 hours 01/17/19 1159 01/17/19 1314   01/15/19 0900  vancomycin (VANCOCIN) 1,500 mg in sodium chloride 0.9 % 500 mL IVPB     1,500 mg 250 mL/hr over 120 Minutes Intravenous On call to O.R. 01/15/19 0636 01/15/19 1015   01/15/19 0600  vancomycin  (VANCOCIN) 1,500 mg in sodium chloride 0.9 % 500 mL IVPB     1,500 mg 250 mL/hr over 120 Minutes Intravenous On call to O.R. 01/14/19 1941 01/15/19 0837      Assessment/Plan: s/p Procedure(s): INCISIONAL HERNIORRHAPHY WITH MESH OMENTECTOMY Impression: Continues to be stable from surgery standpoint.  Patient will need home O2 given her severe COPD.  Her staples will remain for at least 2 to 3 weeks.  LOS: 6 days    [name] 01/20/2019

## 2019-01-20 NOTE — Progress Notes (Signed)
Patient is on Heated high flow nasal cannula , setting's  are 15 liters 60 percent FiO2., she currently is about 89 on her saturation. Will try to stay off BiPAP tonight. She was on 4 liters oxygen earlier and saturation was 83 to 86 which she seemed to tolerate ok. She has started coughing up thick lt yellow mucus.about 2 to 3 cc . Goal is to keep Saturation about 85 to 90 tonight. She will need oxygen at home most likely 4 liter range.

## 2019-01-20 NOTE — TOC Progression Note (Signed)
Transition of Care [facility]) - Progression Note    Patient Details  Name: Veronica Horton MRN: [id number] Date of Birth: 04/04/1961  [facility]) CM/SW Contact  Shade Flood, LCSW Phone Number: 01/20/2019, 11:57 AM  Clinical Narrative:     TOC following. Per MD, pt will need O2 at dc and also [facility] RN. PT also recommending HH PT. Asked [name] with [employer] to talk to pt about charity O2 for home use. Referred to [facility] provider for the week Alvis Lemmings). Per Donaciano Eva at [place], pt lives in [place] and he had to reach out to [facility], [employer], and [employer] to see if they can assist as Alvis Lemmings cannot service pt's address. Awaiting update. TOC will follow and assist with dc needs.  Expected Discharge Plan: [place] Barriers to Discharge: Continued Medical Work up  Expected Discharge Plan and Services Expected Discharge Plan: [place] In-house Referral: Clinical Social Work   Post Acute Care Choice: Durable Medical Equipment Living arrangements for the past 2 months: Single Family Home                 DME Arranged: Bedside commode DME Agency: [employer] Date DME Agency Contacted: 01/16/19 Time DME Agency Contacted: 2 Representative spoke with at DME Agency: Blake Divine             Social Determinants of Health ([place]) Interventions    Readmission Risk Interventions Readmission Risk Prevention Plan 01/20/2019  Medication Screening Complete  Transportation Screening Complete  Some recent data might be hidden

## 2019-01-21 MED ORDER — POLYETHYLENE GLYCOL 3350 17 G PO PACK: 17.0000 g | PACK | Freq: Every day | ORAL | Status: DC | PRN

## 2019-01-21 MED ORDER — LISINOPRIL 10 MG PO TABS
20.0000 mg | ORAL_TABLET | Freq: Every day | ORAL | Status: DC
  Administered 2019-01-21 – 2019-01-30 (×10): 20 mg via ORAL
  Filled 2019-01-21 (×10): qty 2

## 2019-01-21 NOTE — TOC Progression Note (Signed)
Transition of Care [facility]) - Progression Note    Patient Details  Name: Veronica Horton MRN: [id number] Date of Birth: 1960-06-01  Transition of Care [facility]) CM/SW Contact  Ihor Gully, LCSW Phone Number: 01/21/2019, 2:02 PM  Clinical Narrative:    Vaughan Basta at [facility] has agreed to take patient as charity HH (RN, PT). [name] with [employer] is working with patient on charity oxygen.    Expected Discharge Plan: [place] Barriers to Discharge: Continued Medical Work up  Expected Discharge Plan and Services Expected Discharge Plan: [place] In-house Referral: Clinical Social Work   Post Acute Care Choice: Durable Medical Equipment Living arrangements for the past 2 months: Single Family Home                 DME Arranged: Bedside commode DME Agency: [employer] Date DME Agency Contacted: 01/16/19 Time DME Agency Contacted: 48 Representative spoke with at DME Agency: Blake Divine             Social Determinants of Health ([place]) Interventions    Readmission Risk Interventions Readmission Risk Prevention Plan 01/20/2019  Medication Screening Complete  Transportation Screening Complete  Some recent data might be hidden

## 2019-01-21 NOTE — Progress Notes (Signed)
PROGRESS NOTE  Veronica Horton ZSW:[id number] DOB: 31-Jan-1961 DOA: 01/13/2019 PCP: Sharilyn Sites, MD  Brief History:  58 y/o female with history of COPD, HTN, tobacco abuse, morbid obesity presenting with abdominal pain with nausea and vomiting that started evening 01/12/19 and progressively worsened throughout the day on 01/13/19.  She also has had increasing cough with clear sputum x 1-2 days.  She denies f/c, cp, hemoptysis, dysuria, hematuria, hematochezia, melena.  She had some dyspnea on exertion, but stated it was not any worse than usual.  Her last BM was on 01/12/19.  She denies any new medications, but continues to smoke 1ppd.   In the ED, she was afebrile and hemodynamically stable.  BMP, LFTs and CBC were essentially unremarkable except some polycythemia and WBC 11.3.  The patient was afebrile hemodynamically stable but was hypoxic with oxygen saturation 87% on room air.  CT of abd/pelvis showed SBO secondary to a large paraumbilical ventral hernia without strangulation.  NG was placed for decompression.  General surgery was consulted to assist.  Assessment/Plan: SBO - RESOLVED -POD#6 s/p incisional herniorrhaphy with mesh omentectomy - Pt is recovering well. Diet per Dr. Arnoldo Morale.  Having bowel movements.  Pt is eating much better.  Pain is well controlled.    Acute on chronic respiratory failure with hypoxia -Secondary to COPD exacerbation with a component of aspiration pneumonitis -Increasing oxygen requirement, ABG with hypoxemia pO2 54, started bipap, now off bipap. -CT chest - no PE but significant atelectasis and mucus plugging and aspirate seen distal trachea. -Suspect she has chronic hypoxemia.  She is totally asymptomatic with a pulse ox in low to mid 80s.  Appreciate pulmonology consult. Goal is for pulse ox of 88%.   Incentive spirometry encouraged.   I asked patient to consider applying for disability benefits.  I asked social worker to [facility] is trying to  [place] services for home oxygen for the patient.  She can have up 10 L/min at home per care management.   Appreciate Dr. Luan Pulling helping [place] to get her better oxygenated.    Aspiration Pneumonia - completed course of antibiotics.      COPD exacerbation -Continue Pulmicort -Increased duo nebs to every 4 hours -Given IV solumedrol 9/16 with good results  Essential hypertension -Hydralazine as needed SBP>180 -Increased lisinopril to 20 mg daily.   Peripheral neuropathy -gabapentin taken at home as needed  Disposition Plan:   Home when surgically cleared  Family Communication:  Patient updated at bedside  Consultants:  General surgery, pulmonology  Code Status:  FULL   DVT Prophylaxis:  [place] Heparin   Procedures: As Listed in Progress Note Above  Antibiotics: None  Subjective: Patient feeling pretty well but her oxygenation has been difficult to keep up to goal.  She will try to ambulate more today.  She still has some cough and chest congestion but has been trying to use incentive spirometer.    Objective: Vitals:   01/21/19 0900 01/21/19 1000 01/21/19 1018 01/21/19 1117  BP: (!) 163/76 (!) 148/83    Pulse: 62 65  74  Resp: 20 17  (!) 23  Temp:    98.2 F (36.8 C)  TempSrc:    Oral  SpO2: (!) 86% (!) 83% (!) 87% (!) 89%  Weight:      Height:        Intake/Output Summary (Last 24 hours) at 01/21/2019 1118 Last data filed at 01/21/2019 0700 Gross per  24 hour  Intake 600 ml  Output 280 ml  Net 320 ml   Weight change: -1.6 kg Exam:   General:  Pt is alert, follows commands appropriately, not in acute distress  HEENT: No icterus, No thrush, No neck mass, [place]/AT  Cardiovascular: normal S1/S2, no rubs, no gallops  Respiratory: BBS fairly clear to ausculation with bibasilar exp wheezes.  Abdomen: wounds clean and dry and healing. Soft/+BS,  no guarding  Extremities: trace LE edema, No lymphangitis, No petechiae, No rashes, no synovitis  Data Reviewed: I  have personally reviewed following labs and imaging studies Basic Metabolic Panel: Recent Labs  Lab 01/15/19 0443 01/16/19 0414 01/17/19 0456 01/18/19 0501  NA 139 138 137 141  K 4.7 5.1 4.1 4.1  CL 102 105 102 103  CO2 26 26 25 30   GLUCOSE 162* 128* 89 97  BUN 21* 30* 30* 20  CREATININE 0.93 1.15* 1.01* 0.92  CALCIUM 9.1 8.8* 8.4* 8.5*  MG 2.4 2.4 2.3 2.4  PHOS  --  4.5  --   --    Liver Function Tests: Recent Labs  Lab 01/18/19 0501  AST 14*  ALT 16  ALKPHOS 52  BILITOT 1.0  PROT 6.1*  ALBUMIN 3.0*   No results for input(s): LIPASE, AMYLASE in the last 168 hours. No results for input(s): AMMONIA in the last 168 hours. Coagulation Profile: No results for input(s): INR, PROTIME in the last 168 hours. CBC: Recent Labs  Lab 01/15/19 0443 01/16/19 0414 01/17/19 0456 01/18/19 0501  WBC 14.7* 24.4* 14.2* 11.7*  NEUTROABS  --   --   --  9.2*  HGB 16.4* 15.3* 14.9 14.0  HCT 51.6* 48.2* 46.9* 44.4  MCV 102.8* 101.7* 102.2* 102.1*  PLT 156 228 191 184   Cardiac Enzymes: No results for input(s): CKTOTAL, CKMB, CKMBINDEX, TROPONINI in the last 168 hours. BNP: Invalid input(s): POCBNP CBG: No results for input(s): GLUCAP in the last 168 hours. HbA1C: No results for input(s): HGBA1C in the last 72 hours. Urine analysis:    Component Value Date/Time   COLORURINE YELLOW 01/13/2019 1730   APPEARANCEUR CLOUDY (A) 01/13/2019 1730   LABSPEC 1.017 01/13/2019 1730   PHURINE 8.0 01/13/2019 1730   GLUCOSEU NEGATIVE 01/13/2019 1730   HGBUR NEGATIVE 01/13/2019 1730   BILIRUBINUR NEGATIVE 01/13/2019 1730   KETONESUR NEGATIVE 01/13/2019 1730   PROTEINUR 100 (A) 01/13/2019 1730   UROBILINOGEN 0.2 09/25/2010 0530   NITRITE NEGATIVE 01/13/2019 1730   LEUKOCYTESUR NEGATIVE 01/13/2019 1730    Recent Results (from the past 240 hour(s))  SARS Coronavirus 2 [facility] order, Performed in [facility] hospital lab) Nasopharyngeal Nasopharyngeal Swab     Status: None   Collection  Time: 01/13/19  3:59 PM   Specimen: Nasopharyngeal Swab  Result Value Ref Range Status   SARS Coronavirus 2 NEGATIVE NEGATIVE Final    Comment: (NOTE) If result is NEGATIVE SARS-CoV-2 target nucleic acids are NOT DETECTED. The SARS-CoV-2 RNA is generally detectable in upper and lower  respiratory specimens during the acute phase of infection. The lowest  concentration of SARS-CoV-2 viral copies this assay can detect is 250  copies / mL. A negative result does not preclude SARS-CoV-2 infection  and should not be used as the sole basis for treatment or other  patient management decisions.  A negative result may occur with  improper specimen collection / handling, submission of specimen other  than nasopharyngeal swab, presence of viral mutation(s) within the  areas targeted by this assay, and  inadequate number of viral copies  (<250 copies / mL). A negative result must be combined with clinical  observations, patient history, and epidemiological information. If result is POSITIVE SARS-CoV-2 target nucleic acids are DETECTED. The SARS-CoV-2 RNA is generally detectable in upper and lower  respiratory specimens dur ing the acute phase of infection.  Positive  results are indicative of active infection with SARS-CoV-2.  Clinical  correlation with patient history and other diagnostic information is  necessary to determine patient infection status.  Positive results do  not rule out bacterial infection or co-infection with other viruses. If result is PRESUMPTIVE POSTIVE SARS-CoV-2 nucleic acids MAY BE PRESENT.   A presumptive positive result was obtained on the submitted specimen  and confirmed on repeat testing.  While 2019 novel coronavirus  (SARS-CoV-2) nucleic acids may be present in the submitted sample  additional confirmatory testing may be necessary for epidemiological  and / or clinical management purposes  to differentiate between  SARS-CoV-2 and other Sarbecovirus currently known  to infect humans.  If clinically indicated additional testing with an alternate test  methodology [phone]) is advised. The SARS-CoV-2 RNA is generally  detectable in upper and lower respiratory sp ecimens during the acute  phase of infection. The expected result is Negative. Fact Sheet for Patients:  StrictlyIdeas.no Fact Sheet for Healthcare Providers: BankingDealers.co.za This test is not yet approved or cleared by the [place] FDA and has been authorized for detection and/or diagnosis of SARS-CoV-2 by FDA under an Emergency Use Authorization (EUA).  This EUA will remain in effect (meaning this test can be used) for the duration of the COVID-19 declaration under Section 564(b)(1) of the Act, 21 U.S.C. section 360bbb-3(b)(1), unless the authorization is terminated or revoked sooner. Performed at [facility], [address] [place], [place] [address]   Surgical pcr screen     Status: None   Collection Time: 01/14/19  7:41 PM   Specimen: Nasal Mucosa; Nasal Swab  Result Value Ref Range Status   MRSA, PCR NEGATIVE NEGATIVE Final   Staphylococcus aureus NEGATIVE NEGATIVE Final    Comment: (NOTE) The Xpert SA Assay (FDA approved for NASAL specimens in patients 25 years of age and older), is one component of a comprehensive surveillance program. It is not intended to diagnose infection nor to guide or monitor treatment. Performed at [facility], [address], [place], [place] [address]   MRSA PCR Screening     Status: None   Collection Time: 01/15/19  2:15 PM   Specimen: Nasal Mucosa; Nasopharyngeal  Result Value Ref Range Status   MRSA by PCR NEGATIVE NEGATIVE Final    Comment:        The GeneXpert MRSA Assay (FDA approved for NASAL specimens only), is one component of a comprehensive MRSA colonization surveillance program. It is not intended to diagnose MRSA infection nor to guide or monitor treatment for MRSA  infections. Performed at [facility], [address], [place], [place] [address]      Scheduled Meds: . budesonide (PULMICORT) nebulizer solution  0.5 mg Nebulization BID  . Chlorhexidine Gluconate Cloth  6 each Topical Daily  . enoxaparin (LOVENOX) injection  40 mg Subcutaneous Q24H  . guaiFENesin  1,200 mg Oral BID  . ipratropium-albuterol  3 mL Nebulization Q4H  . lisinopril  20 mg Oral Daily  . magnesium hydroxide  30 mL Oral BID  . mouth rinse  15 mL Mouth Rinse BID  . saccharomyces boulardii  250 mg Oral BID  . sodium  chloride flush  3 mL Intravenous Q12H   Continuous Infusions: . sodium chloride      Procedures/Studies: Ct Angio Chest Pe W Or Wo Contrast  Result Date: 01/17/2019 CLINICAL DATA:  Dyspnea chronically. Two days postop small-bowel obstruction and hernia repair. EXAM: CT ANGIOGRAPHY CHEST WITH CONTRAST TECHNIQUE: Multidetector CT imaging of the chest was performed using the standard protocol during bolus administration of intravenous contrast. Multiplanar CT image reconstructions and MIPs were obtained to evaluate the vascular anatomy. CONTRAST:  184mL OMNIPAQUE IOHEXOL 350 MG/ML SOLN COMPARISON:  Chest CT 10/03/2010 and abdominal CT 01/13/2019 FINDINGS: Cardiovascular: Mild cardiomegaly. Minimal calcified plaque over the left anterior descending coronary artery. Thoracic aorta is normal. Pulmonary arterial system is well opacified without evidence of emboli. Aberrant right subclavian artery. Remaining vascular structures are unremarkable. Mediastinum/Nodes: No significant mediastinal or hilar adenopathy. Lungs/Pleura: Lungs are adequately inflated demonstrate moderate consolidation over the lower lobes bilaterally likely atelectasis. Mild atelectatic change over the right middle lobe and lingula. Subtle dependent atelectasis over the posterior right upper lobe. No significant effusion. Dependent debris over the distal trachea and right mainstem bronchus likely aspirate  material. Obstructed right posterior lower lobe bronchus which may be due to mucous plugging versus aspiration. Narrowed right lower lobe bronchi. Upper Abdomen: No acute findings. Musculoskeletal: Degenerative change of the spine. Review of the MIP images confirms the above findings. IMPRESSION: 1.  No evidence of pulmonary embolism. 2. Significant bilateral lower lobe consolidation likely atelectasis. Mild atelectasis over the right middle lobe and lingula as well as minimal dependent atelectasis over the right upper lobe. No effusion. Aspirate material over the distal trachea and right mainstem bronchus. 3. Cardiomegaly with minimal atherosclerotic coronary artery disease. Electronically Signed   By: Marin Olp M.D.   On: 01/17/2019 10:12   Ct Abdomen Pelvis W Contrast  Result Date: 01/13/2019 CLINICAL DATA:  Right-sided abdominal pain and nausea and vomiting beginning last night. Prior cholecystectomy. EXAM: CT ABDOMEN AND PELVIS WITH CONTRAST TECHNIQUE: Multidetector CT imaging of the abdomen and pelvis was performed using the standard protocol following bolus administration of intravenous contrast. CONTRAST:  146mL OMNIPAQUE IOHEXOL 300 MG/ML  SOLN COMPARISON:  05/14/2012 FINDINGS: Lower Chest: Increased linear opacity in both lung bases since 2014, which may be due to subsegmental atelectasis or scarring. Hepatobiliary: No hepatic masses identified. Prior cholecystectomy. No evidence of biliary obstruction. Pancreas:  No mass or inflammatory changes. Spleen: Within normal limits in size and appearance. Adrenals/Urinary Tract: No masses identified. Several small left renal cysts again noted. No evidence of hydronephrosis. Stomach/Bowel: Large paraumbilical ventral hernia is again seen containing multiple small bowel loops. Moderate dilatation of small bowel is seen within the hernia and proximal to the hernia, consistent with small-bowel obstruction. No evidence of small bowel wall thickening or  pneumatosis. No evidence of inflammatory process or abnormal fluid collections. Diverticulosis is seen mainly involving the descending and sigmoid colon, however there is no evidence of diverticulitis. Vascular/Lymphatic: No pathologically enlarged lymph nodes. No abdominal aortic aneurysm. Aortic atherosclerosis. Reproductive:  No mass or other significant abnormality. Other:  None. Musculoskeletal:  No suspicious bone lesions identified. IMPRESSION: 1. Small bowel obstruction due to large paraumbilical ventral hernia, which contains multiple small bowel loops. No signs of bowel strangulation. 2. Colonic diverticulosis, without radiographic evidence of diverticulitis. Aortic Atherosclerosis (ICD10-I70.0). Electronically Signed   By: Marlaine Hind M.D.   On: 01/13/2019 18:50   Dg Chest Port 1 View  Result Date: 01/20/2019 CLINICAL DATA:  Hypoxia. EXAM: PORTABLE CHEST 1 VIEW  COMPARISON:  Radiograph of January 17, 2019. FINDINGS: Stable cardiomegaly. No pneumothorax is noted. Stable bibasilar atelectasis or infiltrates are noted with possible small pleural effusions. Bony thorax is unremarkable. IMPRESSION: Stable bibasilar opacities and small effusions as described above Electronically Signed   By: Marijo Conception M.D.   On: 01/20/2019 15:01   Dg Chest Port 1 View  Result Date: 01/17/2019 CLINICAL DATA:  Acute respiratory failure. EXAM: PORTABLE CHEST 1 VIEW COMPARISON:  Radiograph of January 16, 2019. FINDINGS: Stable cardiomediastinal silhouette. No pneumothorax is noted. Stable bibasilar atelectasis or infiltrates are noted. Small bilateral pleural effusions are noted as well. Bony thorax unremarkable. IMPRESSION: Stable bibasilar opacities as described above. Electronically Signed   By: Marijo Conception M.D.   On: 01/17/2019 07:36   Dg Chest Port 1 View  Result Date: 01/16/2019 CLINICAL DATA:  Hypoxia today. EXAM: PORTABLE CHEST 1 VIEW COMPARISON:  Single-view of the chest 01/13/2019. PA and lateral  chest 09/25/2010. CT chest 10/03/2010. FINDINGS: NG tube has been removed. There is cardiomegaly and interstitial edema. Right pleural effusion and basilar airspace disease have increased. Left basilar airspace disease is mildly improved. No pneumothorax. IMPRESSION: No marked change in cardiomegaly and interstitial edema. Increased small right pleural effusion and basilar atelectasis. Left pleural effusion is slightly decreased. Electronically Signed   By: Inge Rise M.D.   On: 01/16/2019 09:34   Dg Chest Portable 1 View  Result Date: 01/13/2019 CLINICAL DATA:  NG tube placement EXAM: PORTABLE CHEST 1 VIEW COMPARISON:  Radiograph 01/13/2019 FINDINGS: Interval placement of a transesophageal tube with tip and side port distal to the GE junction, curling within the gastric lumen in the left upper quadrant. Redemonstration of the diffuse interstitial opacities and moderate cardiomegaly. No pneumothorax. No new consolidative process. No acute osseous or soft tissue abnormality. Degenerative changes are present in the imaged spine and shoulders. IMPRESSION: 1. Appropriate positioning of the transesophageal tube with the tip and side port distal to the GE junction 2. Slightly diminished lung volumes with persistent interstitial pulmonary edema and cardiomegaly. Electronically Signed   By: Lovena Le M.D.   On: 01/13/2019 20:15   Dg Chest Portable 1 View  Result Date: 01/13/2019 CLINICAL DATA:  Cough. Nausea and vomiting. Right-sided abdominal pain. EXAM: PORTABLE CHEST 1 VIEW COMPARISON:  09/25/2010 FINDINGS: Mild-to-moderate cardiomegaly. Diffuse interstitial infiltrates, suspicious for interstitial edema. Stable linear opacity in right lower lung, consistent with scarring. No evidence of pulmonary consolidation or pleural effusion. IMPRESSION: Cardiomegaly and diffuse interstitial infiltrates, suspicious for mild congestive heart failure. Electronically Signed   By: Marlaine Hind M.D.   On: 01/13/2019  16:41   Critical care time spent 30 mins  Girard Koontz Wynetta Emery, MD [facility] How to contact the [facility] Attending or Consulting provider [place] or covering provider during after hours [place], for this patient?  1. Check the care team in [facility] and look for a) attending/consulting TR[facility] provider listed and b) the [facility] team listed 2. Log into [email or web address] and use [place]'s universal password to access. If you do not have the password, please contact the hospital operator. 3. Locate the [facility] provider you are looking for under [facility] and page to a number that you can be directly reached. 4. If you still have difficulty reaching the provider, please page the [facility] (Director on Call) for the Hospitalists listed on amion for assistance.   If 7PM-7AM, please contact night-coverage [email or web address] Password [email or web address] 01/21/2019, 11:18 AM   LOS: 7 days

## 2019-01-21 NOTE — TOC Progression Note (Signed)
Transition of Care [facility]) - Progression Note    Patient Details  Name: Veronica Horton MRN: [id number] Date of Birth: 1960-05-12  [facility] CM/SW Contact  Ihor Gully, LCSW Phone Number: 01/21/2019, 3:28 PM  Clinical Narrative:    Vaughan Basta with [facility] met with patient about charity [facility] services, advising of financial information needed to do the application. Patient stated that she could walk and did not need PT. Discussed need for RN potential. Patient stated that she was not willing to provide financial information from her aunt whom she lives with.    Expected Discharge Plan: [place] Barriers to Discharge: Continued Medical Work up  Expected Discharge Plan and Services Expected Discharge Plan: [place] In-house Referral: Clinical Social Work   Post Acute Care Choice: Durable Medical Equipment Living arrangements for the past 2 months: Single Family Home                 DME Arranged: Bedside commode DME Agency: [employer] Date DME Agency Contacted: 01/16/19 Time DME Agency Contacted: 75 Representative spoke with at DME Agency: Blake Divine             Social Determinants of Health ([place]) Interventions    Readmission Risk Interventions Readmission Risk Prevention Plan 01/20/2019  Medication Screening Complete  Transportation Screening Complete  Some recent data might be hidden

## 2019-01-21 NOTE — Progress Notes (Signed)
Physical Therapy Note  Patient Details  Name: Veronica Horton MRN: [id number] Date of Birth: 06-28-1960 Today's Date: 01/21/2019    Pt up in chair; states she's been doing the exercises as instructed by PT yesterday and she does not want to do anything else today.    Teena Irani, PTA/CLT [phone]    Roseanne Reno B 01/21/2019, 3:36 PM

## 2019-01-21 NOTE — Progress Notes (Signed)
Subjective: She says she feels okay.  She is still hypoxic.  She is requiring 60% oxygen through high flow nasal cannula.  Oxygenation is marginal with that she has been in the high 80s but now is at about 83%.  She says she is having some trouble with some loose stools.  She still coughing up some sputum.  Objective: Vital signs in last 24 hours: Temp:  [97.7 F (36.5 C)-98.6 F (37 C)] 98.1 F (36.7 C) (09/22 0717) Pulse Rate:  [58-87] 72 (09/22 0717) Resp:  [11-23] 17 (09/22 0717) BP: (103-189)/(55-97) 149/77 (09/22 0400) SpO2:  [83 %-97 %] 83 % (09/22 0717) FiO2 (%):  [60 %-65 %] 60 % (09/22 0346) Weight:  [134 kg] 134 kg (09/22 0500) Weight change: -1.6 kg Last BM Date: 01/20/19  Intake/Output from previous day: 09/21 0701 - 09/22 0700 In: 600 [P.O.:600] Out: 280 [Urine:250; Drains:30]  PHYSICAL EXAM General appearance: alert, cooperative, no distress and morbidly obese Resp: She has rhonchi bilaterally right greater than left Cardio: regular rate and rhythm, S1, S2 normal, no murmur, click, rub or gallop GI: soft, non-tender; bowel sounds normal; no masses,  no organomegaly Extremities: extremities normal, atraumatic, no cyanosis or edema  Lab Results:  No results found for this or any previous visit (from the past 48 hour(s)).  ABGS No results for input(s): PHART, PO2ART, TCO2, HCO3 in the last 72 hours.  Invalid input(s): PCO2 CULTURES Recent Results (from the past 240 hour(s))  SARS Coronavirus 2 [facility] order, Performed in [facility] hospital lab) Nasopharyngeal Nasopharyngeal Swab     Status: None   Collection Time: 01/13/19  3:59 PM   Specimen: Nasopharyngeal Swab  Result Value Ref Range Status   SARS Coronavirus 2 NEGATIVE NEGATIVE Final    Comment: (NOTE) If result is NEGATIVE SARS-CoV-2 target nucleic acids are NOT DETECTED. The SARS-CoV-2 RNA is generally detectable in upper and lower  respiratory specimens during the acute phase of infection. The  lowest  concentration of SARS-CoV-2 viral copies this assay can detect is 250  copies / mL. A negative result does not preclude SARS-CoV-2 infection  and should not be used as the sole basis for treatment or other  patient management decisions.  A negative result may occur with  improper specimen collection / handling, submission of specimen other  than nasopharyngeal swab, presence of viral mutation(s) within the  areas targeted by this assay, and inadequate number of viral copies  (<250 copies / mL). A negative result must be combined with clinical  observations, patient history, and epidemiological information. If result is POSITIVE SARS-CoV-2 target nucleic acids are DETECTED. The SARS-CoV-2 RNA is generally detectable in upper and lower  respiratory specimens dur ing the acute phase of infection.  Positive  results are indicative of active infection with SARS-CoV-2.  Clinical  correlation with patient history and other diagnostic information is  necessary to determine patient infection status.  Positive results do  not rule out bacterial infection or co-infection with other viruses. If result is PRESUMPTIVE POSTIVE SARS-CoV-2 nucleic acids MAY BE PRESENT.   A presumptive positive result was obtained on the submitted specimen  and confirmed on repeat testing.  While 2019 novel coronavirus  (SARS-CoV-2) nucleic acids may be present in the submitted sample  additional confirmatory testing may be necessary for epidemiological  and / or clinical management purposes  to differentiate between  SARS-CoV-2 and other Sarbecovirus currently known to infect humans.  If clinically indicated additional testing with an alternate test  methodology [phone]) is advised. The SARS-CoV-2 RNA is generally  detectable in upper and lower respiratory sp ecimens during the acute  phase of infection. The expected result is Negative. Fact Sheet for Patients:   StrictlyIdeas.no Fact Sheet for Healthcare Providers: BankingDealers.co.za This test is not yet approved or cleared by the [place] FDA and has been authorized for detection and/or diagnosis of SARS-CoV-2 by FDA under an Emergency Use Authorization (EUA).  This EUA will remain in effect (meaning this test can be used) for the duration of the COVID-19 declaration under Section 564(b)(1) of the Act, 21 U.S.C. section 360bbb-3(b)(1), unless the authorization is terminated or revoked sooner. Performed at [facility], [address], [name], [place] [address]   Surgical pcr screen     Status: None   Collection Time: 01/14/19  7:41 PM   Specimen: Nasal Mucosa; Nasal Swab  Result Value Ref Range Status   MRSA, PCR NEGATIVE NEGATIVE Final   Staphylococcus aureus NEGATIVE NEGATIVE Final    Comment: (NOTE) The Xpert SA Assay (FDA approved for NASAL specimens in patients 9 years of age and older), is one component of a comprehensive surveillance program. It is not intended to diagnose infection nor to guide or monitor treatment. Performed at [facility], [address] [place], [place] [address]   MRSA PCR Screening     Status: None   Collection Time: 01/15/19  2:15 PM   Specimen: Nasal Mucosa; Nasopharyngeal  Result Value Ref Range Status   MRSA by PCR NEGATIVE NEGATIVE Final    Comment:        The GeneXpert MRSA Assay (FDA approved for NASAL specimens only), is one component of a comprehensive MRSA colonization surveillance program. It is not intended to diagnose MRSA infection nor to guide or monitor treatment for MRSA infections. Performed at [facility], [address], [place],  [address]    Studies/Results: Dg Chest Port 1 View  Result Date: 01/20/2019 CLINICAL DATA:  Hypoxia. EXAM: PORTABLE CHEST 1 VIEW COMPARISON:  Radiograph of January 17, 2019. FINDINGS: Stable cardiomegaly. No pneumothorax is noted. Stable  bibasilar atelectasis or infiltrates are noted with possible small pleural effusions. Bony thorax is unremarkable. IMPRESSION: Stable bibasilar opacities and small effusions as described above Electronically Signed   By: Marijo Conception M.D.   On: 01/20/2019 15:01    Medications:  Prior to Admission:  Medications Prior to Admission  Medication Sig Dispense Refill Last Dose  . albuterol (PROVENTIL) (2.5 MG/3ML) 0.083% nebulizer solution Take 2.5 mg by nebulization every 6 (six) hours as needed for wheezing or shortness of breath.   unknown  . albuterol (VENTOLIN HFA) 108 (90 Base) MCG/ACT inhaler Inhale 1-2 puffs into the lungs every 6 (six) hours as needed for wheezing or shortness of breath.   unknown  . cyclobenzaprine (FLEXERIL) 10 MG tablet Take 10 mg by mouth daily as needed for muscle spasms.   Past Month at Unknown time  . diphenhydrAMINE HCl (ALLERGY MEDICATION PO) Take 1 tablet by mouth at bedtime.   01/12/2019 at Unknown time  . gabapentin (NEURONTIN) 300 MG capsule Take 600 mg by mouth at bedtime.    01/12/2019 at Unknown time  . ibuprofen (ADVIL) 200 MG tablet Take 400 mg by mouth at bedtime.   01/12/2019 at Unknown time  . lisinopril-hydrochlorothiazide (ZESTORETIC) 10-12.5 MG tablet Take 1 tablet by mouth every morning.   01/13/2019 at Unknown time   Scheduled: . budesonide (PULMICORT) nebulizer solution  0.5 mg Nebulization BID  .  Chlorhexidine Gluconate Cloth  6 each Topical Daily  . enoxaparin (LOVENOX) injection  40 mg Subcutaneous Q24H  . guaiFENesin  1,200 mg Oral BID  . ipratropium-albuterol  3 mL Nebulization Q4H  . lisinopril  20 mg Oral Daily  . magnesium hydroxide  30 mL Oral BID  . mouth rinse  15 mL Mouth Rinse BID  . saccharomyces boulardii  250 mg Oral BID  . sodium chloride flush  3 mL Intravenous Q12H   Continuous: . sodium chloride     DIY:MEBRAX chloride, acetaminophen **OR** acetaminophen, diphenhydrAMINE **OR** diphenhydrAMINE, fentaNYL (SUBLIMAZE)  injection, guaiFENesin-dextromethorphan, ipratropium-albuterol, labetalol, LORazepam, ondansetron **OR** ondansetron (ZOFRAN) IV, polyethylene glycol, polyvinyl alcohol, simethicone, sodium chloride flush, traMADol  Assesment: She came to the hospital because of small bowel obstruction.  This is related to an incisional hernia.  She had surgery to correct the hernia and had mesh.  She had an omentectomy as well.  She has done well from a surgical point of view postop  She has acute hypoxic respiratory failure.  I think this is likely acute on chronic and she has been hypoxic at home but we do not have any documentation of that.  She is requiring high flow nasal cannula at 60% and her oxygenation is marginal.  Her goal would be for her to be in the high 80s 88% or so and right now she is at 83% although she did better through most of the day yesterday.  She has COPD at baseline and I suspect that she is hypoxic at baseline.  She has aspiration pneumonia which is being treated and she is still coughing up sputum Principal Problem:   Small bowel obstruction (HCC) Active Problems:   COPD (chronic obstructive pulmonary disease) (HCC)   Hypertension   Acute respiratory failure with hypoxia (HCC)   Polycythemia   COPD with acute exacerbation (HCC)   Incisional hernia, without obstruction or gangrene   Aspiration pneumonitis (HCC)    Plan: Continue high flow nasal cannula.  Discussed with Dr. Wynetta Emery hospitalist attending.  I do not think she is ready for discharge.  I am concerned about the ability to provide adequate oxygen at home.  Would agree to continue high flow nasal cannula continue physical therapy get her up and moving all of which may help with her oxygenation.  I also agree with Dr. Wynetta Emery that an application for disability would be in order    LOS: 7 days   Alonza Bogus 01/21/2019, 7:42 AM

## 2019-01-21 NOTE — Progress Notes (Signed)
6 Days Post-Op  Subjective: No abdominal pain, nausea, or vomiting.  Objective: Vital signs in last 24 hours: Temp:  [97.7 F (36.5 C)-98.6 F (37 C)] 98.1 F (36.7 C) (09/22 0717) Pulse Rate:  [58-87] 62 (09/22 0800) Resp:  [11-23] 20 (09/22 0800) BP: (103-189)/(59-97) 150/67 (09/22 0800) SpO2:  [82 %-97 %] 85 % (09/22 0833) FiO2 (%):  [60 %-65 %] 65 % (09/22 0833) Weight:  [134 kg] 134 kg (09/22 0500) Last BM Date: 01/20/19  Intake/Output from previous day: 09/21 0701 - 09/22 0700 In: 600 [P.O.:600] Out: 280 [Urine:250; Drains:30] Intake/Output this shift: No intake/output data recorded.  General appearance: alert, cooperative and no distress GI: Impression:Soft, incision healing well.  JP drainage serosanguineous in nature.  Lab Results:  No results for input(s): WBC, HGB, HCT, PLT in the last 72 hours. BMET No results for input(s): NA, K, CL, CO2, GLUCOSE, BUN, CREATININE, CALCIUM in the last 72 hours. PT/INR No results for input(s): LABPROT, INR in the last 72 hours.  Studies/Results: Dg Chest Port 1 View  Result Date: 01/20/2019 CLINICAL DATA:  Hypoxia. EXAM: PORTABLE CHEST 1 VIEW COMPARISON:  Radiograph of January 17, 2019. FINDINGS: Stable cardiomegaly. No pneumothorax is noted. Stable bibasilar atelectasis or infiltrates are noted with possible small pleural effusions. Bony thorax is unremarkable. IMPRESSION: Stable bibasilar opacities and small effusions as described above Electronically Signed   By: Marijo Conception M.D.   On: 01/20/2019 15:01    Anti-infectives: Anti-infectives (From admission, onward)   Start     Dose/Rate Route Frequency Ordered Stop   01/17/19 1400  clindamycin (CLEOCIN) IVPB 600 mg     600 mg 100 mL/hr over 30 Minutes Intravenous Every 8 hours 01/17/19 1314 01/20/19 0558   01/17/19 1200  clindamycin (CLEOCIN) IVPB 300 mg  Status:  Discontinued     300 mg 100 mL/hr over 30 Minutes Intravenous Every 6 hours 01/17/19 1159 01/17/19 1159    01/17/19 1200  clindamycin (CLEOCIN) IVPB 300 mg  Status:  Discontinued     300 mg 100 mL/hr over 30 Minutes Intravenous Every 6 hours 01/17/19 1159 01/17/19 1314   01/15/19 0900  vancomycin (VANCOCIN) 1,500 mg in sodium chloride 0.9 % 500 mL IVPB     1,500 mg 250 mL/hr over 120 Minutes Intravenous On call to O.R. 01/15/19 0636 01/15/19 1015   01/15/19 0600  vancomycin (VANCOCIN) 1,500 mg in sodium chloride 0.9 % 500 mL IVPB     1,500 mg 250 mL/hr over 120 Minutes Intravenous On call to O.R. 01/14/19 1941 01/15/19 0837      Assessment/Plan: s/p Procedure(s): INCISIONAL HERNIORRHAPHY WITH MESH OMENTECTOMY Impression: Postoperative day 6.  No acute changes from surgical standpoint.  Discharge planning ongoing.  LOS: 7 days    [name] 01/21/2019

## 2019-01-22 ENCOUNTER — Inpatient Hospital Stay (HOSPITAL_COMMUNITY): Payer: Self-pay

## 2019-01-22 DIAGNOSIS — J690 Pneumonitis due to inhalation of food and vomit: Secondary | ICD-10-CM

## 2019-01-22 LAB — CBC
HCT: 45 % (ref 36.0–46.0)
Hemoglobin: 14.2 g/dL (ref 12.0–15.0)
MCH: 32 pg (ref 26.0–34.0)
MCHC: 31.6 g/dL (ref 30.0–36.0)
MCV: 101.4 fL — ABNORMAL HIGH (ref 80.0–100.0)
Platelets: 216 10*3/uL (ref 150–400)
RBC: 4.44 MIL/uL (ref 3.87–5.11)
RDW: 14.3 % (ref 11.5–15.5)
WBC: 11.4 10*3/uL — ABNORMAL HIGH (ref 4.0–10.5)
nRBC: 0 % (ref 0.0–0.2)

## 2019-01-22 LAB — BASIC METABOLIC PANEL
Anion gap: 8 (ref 5–15)
BUN: 12 mg/dL (ref 6–20)
CO2: 28 mmol/L (ref 22–32)
Calcium: 8.3 mg/dL — ABNORMAL LOW (ref 8.9–10.3)
Chloride: 104 mmol/L (ref 98–111)
Creatinine, Ser: 0.92 mg/dL (ref 0.44–1.00)
GFR calc Af Amer: >60 mL/min (ref >60)
GFR calc non Af Amer: >60 mL/min (ref >60)
Glucose, Bld: 109 mg/dL — ABNORMAL HIGH (ref 70–99)
Potassium: 3.4 mmol/L — ABNORMAL LOW (ref 3.5–5.1)
Sodium: 140 mmol/L (ref 135–145)

## 2019-01-22 LAB — BRAIN NATRIURETIC PEPTIDE: B Natriuretic Peptide: 68 pg/mL (ref 0.0–100.0)

## 2019-01-22 MED ORDER — CYANOCOBALAMIN 1000 MCG/ML IJ SOLN
1000.0000 ug | Freq: Every day | INTRAMUSCULAR | Status: AC
  Administered 2019-01-22 – 2019-01-24 (×3): 1000 ug via INTRAMUSCULAR
  Filled 2019-01-22 (×3): qty 1

## 2019-01-22 MED ORDER — METHYLPREDNISOLONE SODIUM SUCC 40 MG IJ SOLR
40.0000 mg | Freq: Two times a day (BID) | INTRAMUSCULAR | Status: DC
  Administered 2019-01-22 – 2019-01-29 (×15): 40 mg via INTRAVENOUS
  Filled 2019-01-22 (×14): qty 1

## 2019-01-22 MED ORDER — FOLIC ACID 1 MG PO TABS
1.0000 mg | ORAL_TABLET | Freq: Every day | ORAL | Status: DC
  Administered 2019-01-22 – 2019-01-30 (×9): 1 mg via ORAL
  Filled 2019-01-22 (×9): qty 1

## 2019-01-22 NOTE — Progress Notes (Signed)
Subjective: She says she feels okay.  She is still on 65% oxygen via high flow nasal cannula and has oxygen saturation around 88%.  She is still coughing.  No complaints from her abdomen.  Objective: Vital signs in last 24 hours: Temp:  [98 F (36.7 C)-99.1 F (37.3 C)] 98 F (36.7 C) (09/23 0400) Pulse Rate:  [62-78] 67 (09/23 0600) Resp:  [17-26] 22 (09/23 0600) BP: (141-166)/(67-83) 142/76 (09/23 0600) SpO2:  [82 %-92 %] 87 % (09/23 0738) FiO2 (%):  [60 %-65 %] 65 % (09/23 0738) Weight:  [133.8 kg] 133.8 kg (09/23 0500) Weight change: -0.2 kg Last BM Date: 01/20/19  Intake/Output from previous day: No intake/output data recorded.  PHYSICAL EXAM General appearance: alert, cooperative and morbidly obese Resp: She has some wheezes left greater than right Cardio: regular rate and rhythm, S1, S2 normal, no murmur, click, rub or gallop GI: soft, non-tender; bowel sounds normal; no masses,  no organomegaly Extremities: extremities normal, atraumatic, no cyanosis or edema  Lab Results:  No results found for this or any previous visit (from the past 48 hour(s)).  ABGS No results for input(s): PHART, PO2ART, TCO2, HCO3 in the last 72 hours.  Invalid input(s): PCO2 CULTURES Recent Results (from the past 240 hour(s))  SARS Coronavirus 2 [facility] order, Performed in [facility] hospital lab) Nasopharyngeal Nasopharyngeal Swab     Status: None   Collection Time: 01/13/19  3:59 PM   Specimen: Nasopharyngeal Swab  Result Value Ref Range Status   SARS Coronavirus 2 NEGATIVE NEGATIVE Final    Comment: (NOTE) If result is NEGATIVE SARS-CoV-2 target nucleic acids are NOT DETECTED. The SARS-CoV-2 RNA is generally detectable in upper and lower  respiratory specimens during the acute phase of infection. The lowest  concentration of SARS-CoV-2 viral copies this assay can detect is 250  copies / mL. A negative result does not preclude SARS-CoV-2 infection  and should not be used as the  sole basis for treatment or other  patient management decisions.  A negative result may occur with  improper specimen collection / handling, submission of specimen other  than nasopharyngeal swab, presence of viral mutation(s) within the  areas targeted by this assay, and inadequate number of viral copies  (<250 copies / mL). A negative result must be combined with clinical  observations, patient history, and epidemiological information. If result is POSITIVE SARS-CoV-2 target nucleic acids are DETECTED. The SARS-CoV-2 RNA is generally detectable in upper and lower  respiratory specimens dur ing the acute phase of infection.  Positive  results are indicative of active infection with SARS-CoV-2.  Clinical  correlation with patient history and other diagnostic information is  necessary to determine patient infection status.  Positive results do  not rule out bacterial infection or co-infection with other viruses. If result is PRESUMPTIVE POSTIVE SARS-CoV-2 nucleic acids MAY BE PRESENT.   A presumptive positive result was obtained on the submitted specimen  and confirmed on repeat testing.  While 2019 novel coronavirus  (SARS-CoV-2) nucleic acids may be present in the submitted sample  additional confirmatory testing may be necessary for epidemiological  and / or clinical management purposes  to differentiate between  SARS-CoV-2 and other Sarbecovirus currently known to infect humans.  If clinically indicated additional testing with an alternate test  methodology [phone]) is advised. The SARS-CoV-2 RNA is generally  detectable in upper and lower respiratory sp ecimens during the acute  phase of infection. The expected result is Negative. Fact Sheet for Patients:  StrictlyIdeas.no Fact Sheet for Healthcare Providers: BankingDealers.co.za This test is not yet approved or cleared by the [place] FDA and has been authorized for detection  and/or diagnosis of SARS-CoV-2 by FDA under an Emergency Use Authorization (EUA).  This EUA will remain in effect (meaning this test can be used) for the duration of the COVID-19 declaration under Section 564(b)(1) of the Act, 21 U.S.C. section 360bbb-3(b)(1), unless the authorization is terminated or revoked sooner. Performed at [facility], [address] [place], [place] [address]   Surgical pcr screen     Status: None   Collection Time: 01/14/19  7:41 PM   Specimen: Nasal Mucosa; Nasal Swab  Result Value Ref Range Status   MRSA, PCR NEGATIVE NEGATIVE Final   Staphylococcus aureus NEGATIVE NEGATIVE Final    Comment: (NOTE) The Xpert SA Assay (FDA approved for NASAL specimens in patients 34 years of age and older), is one component of a comprehensive surveillance program. It is not intended to diagnose infection nor to guide or monitor treatment. Performed at [facility], [address] [place], [place] [address]   MRSA PCR Screening     Status: None   Collection Time: 01/15/19  2:15 PM   Specimen: Nasal Mucosa; Nasopharyngeal  Result Value Ref Range Status   MRSA by PCR NEGATIVE NEGATIVE Final    Comment:        The GeneXpert MRSA Assay (FDA approved for NASAL specimens only), is one component of a comprehensive MRSA colonization surveillance program. It is not intended to diagnose MRSA infection nor to guide or monitor treatment for MRSA infections. Performed at [facility], [address], [place], [place] [address]    Studies/Results: Dg Chest Port 1 View  Result Date: 01/20/2019 CLINICAL DATA:  Hypoxia. EXAM: PORTABLE CHEST 1 VIEW COMPARISON:  Radiograph of January 17, 2019. FINDINGS: Stable cardiomegaly. No pneumothorax is noted. Stable bibasilar atelectasis or infiltrates are noted with possible small pleural effusions. Bony thorax is unremarkable. IMPRESSION: Stable bibasilar opacities and small effusions as described above Electronically Signed   By: Marijo Conception M.D.   On: 01/20/2019 15:01    Medications:  Prior to Admission:  Medications Prior to Admission  Medication Sig Dispense Refill Last Dose  . albuterol (PROVENTIL) (2.5 MG/3ML) 0.083% nebulizer solution Take 2.5 mg by nebulization every 6 (six) hours as needed for wheezing or shortness of breath.   unknown  . albuterol (VENTOLIN HFA) 108 (90 Base) MCG/ACT inhaler Inhale 1-2 puffs into the lungs every 6 (six) hours as needed for wheezing or shortness of breath.   unknown  . cyclobenzaprine (FLEXERIL) 10 MG tablet Take 10 mg by mouth daily as needed for muscle spasms.   Past Month at Unknown time  . diphenhydrAMINE HCl (ALLERGY MEDICATION PO) Take 1 tablet by mouth at bedtime.   01/12/2019 at Unknown time  . gabapentin (NEURONTIN) 300 MG capsule Take 600 mg by mouth at bedtime.    01/12/2019 at Unknown time  . ibuprofen (ADVIL) 200 MG tablet Take 400 mg by mouth at bedtime.   01/12/2019 at Unknown time  . lisinopril-hydrochlorothiazide (ZESTORETIC) 10-12.5 MG tablet Take 1 tablet by mouth every morning.   01/13/2019 at Unknown time   Scheduled: . budesonide (PULMICORT) nebulizer solution  0.5 mg Nebulization BID  . Chlorhexidine Gluconate Cloth  6 each Topical Daily  . enoxaparin (LOVENOX) injection  40 mg Subcutaneous Q24H  . guaiFENesin  1,200 mg Oral BID  . ipratropium-albuterol  3 mL Nebulization Q4H  .  lisinopril  20 mg Oral Daily  . magnesium hydroxide  30 mL Oral BID  . mouth rinse  15 mL Mouth Rinse BID  . saccharomyces boulardii  250 mg Oral BID  . sodium chloride flush  3 mL Intravenous Q12H   Continuous: . sodium chloride     SVX:[id number] chloride, acetaminophen **OR** acetaminophen, diphenhydrAMINE **OR** diphenhydrAMINE, fentaNYL (SUBLIMAZE) injection, guaiFENesin-dextromethorphan, ipratropium-albuterol, labetalol, LORazepam, ondansetron **OR** ondansetron (ZOFRAN) IV, polyethylene glycol, polyvinyl alcohol, simethicone, sodium chloride flush, traMADol  Assesment: She  was admitted with small bowel obstruction from an incisional hernia.  She eventually underwent herniorrhaphy with mesh and had omentectomy as well.  She is recovering from that.  She has acute hypoxic respiratory failure and I suspect that she has chronic hypoxic respiratory failure that has not been diagnosed at home.  She is still running in the high 80s but on 65% oxygen by high flow nasal cannula.  Our goal is going to be 85 to 88%.  She has COPD exacerbation and she is being treated for that.  If okay with general surgery it may be helpful to give her some steroids  She had polycythemia on admission suggestive of the chronic hypoxic respiratory failure.  Some of this may have been from hemoconcentration.  She has aspiration pneumonia which is being treated Principal Problem:   Small bowel obstruction ([place]) Active Problems:   COPD (chronic obstructive pulmonary disease) (HCC)   Hypertension   Acute respiratory failure with hypoxia (HCC)   Polycythemia   COPD with acute exacerbation (HCC)   Incisional hernia, without obstruction or gangrene   Aspiration pneumonitis ([place])    Plan: Continue current treatments.  Continue getting up and moving.  I have messaged Dr. Arnoldo Morale her general surgeon to see if we can do IV steroids.   LOS: 8 days   Alonza Bogus 01/22/2019, 7:49 AM

## 2019-01-22 NOTE — Progress Notes (Signed)
PROGRESS NOTE  Veronica Horton WUJ:[id number] DOB: 03/15/1961 DOA: 01/13/2019 PCP: Sharilyn Sites, MD  Brief History:  58 y/o female with history of COPD, HTN, tobacco abuse, morbid obesity presenting with abdominal pain with nausea and vomiting that started evening 01/12/19 and progressively worsened throughout the day on 01/13/19.  She also has had increasing cough with clear sputum x 1-2 days.  She denies f/c, cp, hemoptysis, dysuria, hematuria, hematochezia, melena.  She had some dyspnea on exertion, but stated it was not any worse than usual.  Her last BM was on 01/12/19.  She denies any new medications, but continues to smoke 1ppd.   In the ED, she was afebrile and hemodynamically stable.  BMP, LFTs and CBC were essentially unremarkable except some polycythemia and WBC 11.3.  The patient was afebrile hemodynamically stable but was hypoxic with oxygen saturation 87% on room air.  CT of abd/pelvis showed SBO secondary to a large paraumbilical ventral hernia without strangulation.  NG was placed for decompression.  General surgery was consulted to assist.  Assessment/Plan: SBO -NG tube placed for decompression initially -01/15/19--incisional herniorrhaphy with mesh and omentectomy -having BMs and tolerating diet post-op -Optimize electrolytes  Acute respiratory failure with hypoxia -Secondary to COPD exacerbation with a component of aspiration pneumonitis -appreciate pulmonary consult -repeat CXR -BNP -Stable on HF [employer] presently -Wean oxygen for saturation greater 85-88%  COPD exacerbation -Continue Pulmicort -Continue duo nebs -reStart IV Solu-Medrol  Essential hypertension -Restarted lisinopril -Hydralazine as needed SBP>180  Peripheral neuropathy -Serum N56--213 -Folic YQMV--7.8 -supplement B12 and folate       Disposition Plan:   remain in SDU due to high oxygen requirement  Family Communication:  No Family at bedside  Consultants:  General  surgery, pulmonary  Code Status:  FULL   DVT Prophylaxis:  [place] Heparin    Procedures: As Listed in Progress Note Above  Antibiotics:       Subjective: Patient denies fevers, chills, headache, chest pain, dyspnea, nausea, vomiting, diarrhea, abdominal pain, dysuria, hematuria, hematochezia, and melena.   Objective: Vitals:   01/22/19 0500 01/22/19 0600 01/22/19 0738 01/22/19 0754  BP: (!) 151/74 (!) 142/76    Pulse: 62 67    Resp: (!) 21 (!) 22    Temp:      TempSrc:      SpO2: (!) 86% (!) 85% (!) 87% (!) 87%  Weight: 133.8 kg     Height:       No intake or output data in the 24 hours ending 01/22/19 0855 Weight change: -0.2 kg Exam:   General:  Pt is alert, follows commands appropriately, not in acute distress  HEENT: No icterus, No thrush, No neck mass, /AT  Cardiovascular: RRR, S1/S2, no rubs, no gallops  Respiratory: bilateral wheeze and scattered rales  Abdomen: Soft/+BS, non tender, non distended, no guarding  Extremities: 1 + LE edema, No lymphangitis, No petechiae, No rashes, no synovitis   Data Reviewed: I have personally reviewed following labs and imaging studies Basic Metabolic Panel: Recent Labs  Lab 01/16/19 0414 01/17/19 0456 01/18/19 0501  NA 138 137 141  K 5.1 4.1 4.1  CL 105 102 103  CO2 26 25 30   GLUCOSE 128* 89 97  BUN 30* 30* 20  CREATININE 1.15* 1.01* 0.92  CALCIUM 8.8* 8.4* 8.5*  MG 2.4 2.3 2.4  PHOS 4.5  --   --    Liver Function Tests: Recent Labs  Lab 01/18/19 0501  AST 14*  ALT 16  ALKPHOS 52  BILITOT 1.0  PROT 6.1*  ALBUMIN 3.0*   No results for input(s): LIPASE, AMYLASE in the last 168 hours. No results for input(s): AMMONIA in the last 168 hours. Coagulation Profile: No results for input(s): INR, PROTIME in the last 168 hours. CBC: Recent Labs  Lab 01/16/19 0414 01/17/19 0456 01/18/19 0501  WBC 24.4* 14.2* 11.7*  NEUTROABS  --   --  9.2*  HGB 15.3* 14.9 14.0  HCT 48.2* 46.9* 44.4  MCV  101.7* 102.2* 102.1*  PLT 228 191 184   Cardiac Enzymes: No results for input(s): CKTOTAL, CKMB, CKMBINDEX, TROPONINI in the last 168 hours. BNP: Invalid input(s): POCBNP CBG: No results for input(s): GLUCAP in the last 168 hours. HbA1C: No results for input(s): HGBA1C in the last 72 hours. Urine analysis:    Component Value Date/Time   COLORURINE YELLOW 01/13/2019 1730   APPEARANCEUR CLOUDY (A) 01/13/2019 1730   LABSPEC 1.017 01/13/2019 1730   PHURINE 8.0 01/13/2019 1730   GLUCOSEU NEGATIVE 01/13/2019 1730   HGBUR NEGATIVE 01/13/2019 1730   BILIRUBINUR NEGATIVE 01/13/2019 1730   KETONESUR NEGATIVE 01/13/2019 1730   PROTEINUR 100 (A) 01/13/2019 1730   UROBILINOGEN 0.2 09/25/2010 0530   NITRITE NEGATIVE 01/13/2019 1730   LEUKOCYTESUR NEGATIVE 01/13/2019 1730   Sepsis Labs: @LABRCNTIP (procalcitonin:4,lacticidven:4) ) Recent Results (from the past 240 hour(s))  SARS Coronavirus 2 [facility] order, Performed in [facility] hospital lab) Nasopharyngeal Nasopharyngeal Swab     Status: None   Collection Time: 01/13/19  3:59 PM   Specimen: Nasopharyngeal Swab  Result Value Ref Range Status   SARS Coronavirus 2 NEGATIVE NEGATIVE Final    Comment: (NOTE) If result is NEGATIVE SARS-CoV-2 target nucleic acids are NOT DETECTED. The SARS-CoV-2 RNA is generally detectable in upper and lower  respiratory specimens during the acute phase of infection. The lowest  concentration of SARS-CoV-2 viral copies this assay can detect is 250  copies / mL. A negative result does not preclude SARS-CoV-2 infection  and should not be used as the sole basis for treatment or other  patient management decisions.  A negative result may occur with  improper specimen collection / handling, submission of specimen other  than nasopharyngeal swab, presence of viral mutation(s) within the  areas targeted by this assay, and inadequate number of viral copies  (<250 copies / mL). A negative result must be  combined with clinical  observations, patient history, and epidemiological information. If result is POSITIVE SARS-CoV-2 target nucleic acids are DETECTED. The SARS-CoV-2 RNA is generally detectable in upper and lower  respiratory specimens dur ing the acute phase of infection.  Positive  results are indicative of active infection with SARS-CoV-2.  Clinical  correlation with patient history and other diagnostic information is  necessary to determine patient infection status.  Positive results do  not rule out bacterial infection or co-infection with other viruses. If result is PRESUMPTIVE POSTIVE SARS-CoV-2 nucleic acids MAY BE PRESENT.   A presumptive positive result was obtained on the submitted specimen  and confirmed on repeat testing.  While 2019 novel coronavirus  (SARS-CoV-2) nucleic acids may be present in the submitted sample  additional confirmatory testing may be necessary for epidemiological  and / or clinical management purposes  to differentiate between  SARS-CoV-2 and other Sarbecovirus currently known to infect humans.  If clinically indicated additional testing with an alternate test  methodology [id number]) is advised. The SARS-CoV-2 RNA is generally  detectable in upper and lower  respiratory sp ecimens during the acute  phase of infection. The expected result is Negative. Fact Sheet for Patients:  StrictlyIdeas.no Fact Sheet for Healthcare Providers: BankingDealers.co.za This test is not yet approved or cleared by the [place] FDA and has been authorized for detection and/or diagnosis of SARS-CoV-2 by FDA under an Emergency Use Authorization (EUA).  This EUA will remain in effect (meaning this test can be used) for the duration of the COVID-19 declaration under Section 564(b)(1) of the Act, 21 U.S.C. section 360bbb-3(b)(1), unless the authorization is terminated or revoked sooner. Performed at [facility],  [address], [place], [place] [address]   Surgical pcr screen     Status: None   Collection Time: 01/14/19  7:41 PM   Specimen: Nasal Mucosa; Nasal Swab  Result Value Ref Range Status   MRSA, PCR NEGATIVE NEGATIVE Final   Staphylococcus aureus NEGATIVE NEGATIVE Final    Comment: (NOTE) The Xpert SA Assay (FDA approved for NASAL specimens in patients 18 years of age and older), is one component of a comprehensive surveillance program. It is not intended to diagnose infection nor to guide or monitor treatment. Performed at [facility], [address], [place],  [address]   MRSA PCR Screening     Status: None   Collection Time: 01/15/19  2:15 PM   Specimen: Nasal Mucosa; Nasopharyngeal  Result Value Ref Range Status   MRSA by PCR NEGATIVE NEGATIVE Final    Comment:        The GeneXpert MRSA Assay (FDA approved for NASAL specimens only), is one component of a comprehensive MRSA colonization surveillance program. It is not intended to diagnose MRSA infection nor to guide or monitor treatment for MRSA infections. Performed at [facility], [address], [place],  [address]      Scheduled Meds: . budesonide (PULMICORT) nebulizer solution  0.5 mg Nebulization BID  . Chlorhexidine Gluconate Cloth  6 each Topical Daily  . enoxaparin (LOVENOX) injection  40 mg Subcutaneous Q24H  . guaiFENesin  1,200 mg Oral BID  . ipratropium-albuterol  3 mL Nebulization Q4H  . lisinopril  20 mg Oral Daily  . magnesium hydroxide  30 mL Oral BID  . mouth rinse  15 mL Mouth Rinse BID  . methylPREDNISolone (SOLU-MEDROL) injection  40 mg Intravenous Q12H  . saccharomyces boulardii  250 mg Oral BID  . sodium chloride flush  3 mL Intravenous Q12H   Continuous Infusions: . sodium chloride      Procedures/Studies: Ct Angio Chest Pe W Or Wo Contrast  Result Date: 01/17/2019 CLINICAL DATA:  Dyspnea chronically. Two days postop small-bowel obstruction and hernia repair. EXAM: CT ANGIOGRAPHY  CHEST WITH CONTRAST TECHNIQUE: Multidetector CT imaging of the chest was performed using the standard protocol during bolus administration of intravenous contrast. Multiplanar CT image reconstructions and MIPs were obtained to evaluate the vascular anatomy. CONTRAST:  196mL OMNIPAQUE IOHEXOL 350 MG/ML SOLN COMPARISON:  Chest CT 10/03/2010 and abdominal CT 01/13/2019 FINDINGS: Cardiovascular: Mild cardiomegaly. Minimal calcified plaque over the left anterior descending coronary artery. Thoracic aorta is normal. Pulmonary arterial system is well opacified without evidence of emboli. Aberrant right subclavian artery. Remaining vascular structures are unremarkable. Mediastinum/Nodes: No significant mediastinal or hilar adenopathy. Lungs/Pleura: Lungs are adequately inflated demonstrate moderate consolidation over the lower lobes bilaterally likely atelectasis. Mild atelectatic change over the right middle lobe and lingula. Subtle dependent atelectasis over the posterior right upper lobe. No significant effusion. Dependent debris over the distal trachea and right mainstem bronchus  likely aspirate material. Obstructed right posterior lower lobe bronchus which may be due to mucous plugging versus aspiration. Narrowed right lower lobe bronchi. Upper Abdomen: No acute findings. Musculoskeletal: Degenerative change of the spine. Review of the MIP images confirms the above findings. IMPRESSION: 1.  No evidence of pulmonary embolism. 2. Significant bilateral lower lobe consolidation likely atelectasis. Mild atelectasis over the right middle lobe and lingula as well as minimal dependent atelectasis over the right upper lobe. No effusion. Aspirate material over the distal trachea and right mainstem bronchus. 3. Cardiomegaly with minimal atherosclerotic coronary artery disease. Electronically Signed   By: Marin Olp M.D.   On: 01/17/2019 10:12   Ct Abdomen Pelvis W Contrast  Result Date: 01/13/2019 CLINICAL DATA:   Right-sided abdominal pain and nausea and vomiting beginning last night. Prior cholecystectomy. EXAM: CT ABDOMEN AND PELVIS WITH CONTRAST TECHNIQUE: Multidetector CT imaging of the abdomen and pelvis was performed using the standard protocol following bolus administration of intravenous contrast. CONTRAST:  172mL OMNIPAQUE IOHEXOL 300 MG/ML  SOLN COMPARISON:  05/14/2012 FINDINGS: Lower Chest: Increased linear opacity in both lung bases since 2014, which may be due to subsegmental atelectasis or scarring. Hepatobiliary: No hepatic masses identified. Prior cholecystectomy. No evidence of biliary obstruction. Pancreas:  No mass or inflammatory changes. Spleen: Within normal limits in size and appearance. Adrenals/Urinary Tract: No masses identified. Several small left renal cysts again noted. No evidence of hydronephrosis. Stomach/Bowel: Large paraumbilical ventral hernia is again seen containing multiple small bowel loops. Moderate dilatation of small bowel is seen within the hernia and proximal to the hernia, consistent with small-bowel obstruction. No evidence of small bowel wall thickening or pneumatosis. No evidence of inflammatory process or abnormal fluid collections. Diverticulosis is seen mainly involving the descending and sigmoid colon, however there is no evidence of diverticulitis. Vascular/Lymphatic: No pathologically enlarged lymph nodes. No abdominal aortic aneurysm. Aortic atherosclerosis. Reproductive:  No mass or other significant abnormality. Other:  None. Musculoskeletal:  No suspicious bone lesions identified. IMPRESSION: 1. Small bowel obstruction due to large paraumbilical ventral hernia, which contains multiple small bowel loops. No signs of bowel strangulation. 2. Colonic diverticulosis, without radiographic evidence of diverticulitis. Aortic Atherosclerosis (ICD10-I70.0). Electronically Signed   By: Marlaine Hind M.D.   On: 01/13/2019 18:50   Dg Chest Port 1 View  Result Date:  01/20/2019 CLINICAL DATA:  Hypoxia. EXAM: PORTABLE CHEST 1 VIEW COMPARISON:  Radiograph of January 17, 2019. FINDINGS: Stable cardiomegaly. No pneumothorax is noted. Stable bibasilar atelectasis or infiltrates are noted with possible small pleural effusions. Bony thorax is unremarkable. IMPRESSION: Stable bibasilar opacities and small effusions as described above Electronically Signed   By: Marijo Conception M.D.   On: 01/20/2019 15:01   Dg Chest Port 1 View  Result Date: 01/17/2019 CLINICAL DATA:  Acute respiratory failure. EXAM: PORTABLE CHEST 1 VIEW COMPARISON:  Radiograph of January 16, 2019. FINDINGS: Stable cardiomediastinal silhouette. No pneumothorax is noted. Stable bibasilar atelectasis or infiltrates are noted. Small bilateral pleural effusions are noted as well. Bony thorax unremarkable. IMPRESSION: Stable bibasilar opacities as described above. Electronically Signed   By: Marijo Conception M.D.   On: 01/17/2019 07:36   Dg Chest Port 1 View  Result Date: 01/16/2019 CLINICAL DATA:  Hypoxia today. EXAM: PORTABLE CHEST 1 VIEW COMPARISON:  Single-view of the chest 01/13/2019. PA and lateral chest 09/25/2010. CT chest 10/03/2010. FINDINGS: NG tube has been removed. There is cardiomegaly and interstitial edema. Right pleural effusion and basilar airspace disease have increased. Left basilar  airspace disease is mildly improved. No pneumothorax. IMPRESSION: No marked change in cardiomegaly and interstitial edema. Increased small right pleural effusion and basilar atelectasis. Left pleural effusion is slightly decreased. Electronically Signed   By: Inge Rise M.D.   On: 01/16/2019 09:34   Dg Chest Portable 1 View  Result Date: 01/13/2019 CLINICAL DATA:  NG tube placement EXAM: PORTABLE CHEST 1 VIEW COMPARISON:  Radiograph 01/13/2019 FINDINGS: Interval placement of a transesophageal tube with tip and side port distal to the GE junction, curling within the gastric lumen in the left upper quadrant.  Redemonstration of the diffuse interstitial opacities and moderate cardiomegaly. No pneumothorax. No new consolidative process. No acute osseous or soft tissue abnormality. Degenerative changes are present in the imaged spine and shoulders. IMPRESSION: 1. Appropriate positioning of the transesophageal tube with the tip and side port distal to the GE junction 2. Slightly diminished lung volumes with persistent interstitial pulmonary edema and cardiomegaly. Electronically Signed   By: Lovena Le M.D.   On: 01/13/2019 20:15   Dg Chest Portable 1 View  Result Date: 01/13/2019 CLINICAL DATA:  Cough. Nausea and vomiting. Right-sided abdominal pain. EXAM: PORTABLE CHEST 1 VIEW COMPARISON:  09/25/2010 FINDINGS: Mild-to-moderate cardiomegaly. Diffuse interstitial infiltrates, suspicious for interstitial edema. Stable linear opacity in right lower lung, consistent with scarring. No evidence of pulmonary consolidation or pleural effusion. IMPRESSION: Cardiomegaly and diffuse interstitial infiltrates, suspicious for mild congestive heart failure. Electronically Signed   By: Marlaine Hind M.D.   On: 01/13/2019 16:41    Orson Eva, DO  [facility] Pager [phone]  If 7PM-7AM, please contact night-coverage [email or web address] Password [facility] 01/22/2019, 8:55 AM   LOS: 8 days

## 2019-01-22 NOTE — Progress Notes (Signed)
Physical Therapy Treatment Patient Details Name: Veronica Horton MRN: [id number] DOB: [birth year] Today's Date: 01/22/2019    History of Present Illness Veronica Horton is a 58 y.o. female with medical history significant for COPD, hypertension, hiatal hernia, and history of cholecystectomy, now presenting to the emergency department for evaluation of abdominal pain with nausea and vomiting.  Patient reports that she been in her usual state of health until last night when she developed abdominal pain with nausea and vomiting.  Pain and nausea/vomiting persisted throughout the night and through today, not necessarily worsening, but not improving either.  She also had a marked increase in her chronic cough last night accompanied by thick sputum production.  She does not feel particularly short of breath.  She has not noted any lower extremity swelling or tenderness.  She denies any fevers or chills.  She has not had these symptoms previously.  She denies diarrhea, melena, or hematochezia.    PT Comments    Patient presents up in chair, agreeable for therapy and continues to be on 35.2 LPM of high flow O2.  Patient demonstrates good return for completing BLE ROM/strengthening exercises, able to take steps forward/backwards at bedside without loss of balance, but limited secondary to fatigue and SpO2 between 84-88%.  Patient continue sitting up in chair after therapy.  Patient will benefit from continued physical therapy in hospital and recommended venue below to increase strength, balance, endurance for safe ADLs and gait.   Follow Up Recommendations  Home health PT;Supervision - Intermittent     Equipment Recommendations  3in1 (PT)    Recommendations for Other Services       Precautions / Restrictions Precautions Precautions: Fall Restrictions Weight Bearing Restrictions: No    Mobility  Bed Mobility               General bed mobility comments: Patient presents in chair (assisted  by nursing staff)  Transfers Overall transfer level: Needs assistance Equipment used: Rolling walker (2 wheeled) Transfers: Sit to/from Stand;Stand Pivot Transfers Sit to Stand: Supervision;Min guard Stand pivot transfers: Supervision;Min guard       General transfer comment: increased time, slightly labored movement  Ambulation/Gait Ambulation/Gait assistance: Min guard Gait Distance (Feet): 18 Feet Assistive device: Rolling walker (2 wheeled) Gait Pattern/deviations: Decreased step length - right;Decreased step length - left;Decreased stride length Gait velocity: slow   General Gait Details: limited to 15-16 slow slilghtly labored forward/backward steps at bedside while on 35.2 LPM high flow O2 with SpO2 between 84-88%   Stairs             Wheelchair Mobility    Modified Rankin (Stroke Patients Only)       Balance Overall balance assessment: Needs assistance Sitting-balance support: Feet supported;No upper extremity supported Sitting balance-Leahy Scale: Good Sitting balance - Comments: seated in chair   Standing balance support: During functional activity;Bilateral upper extremity supported Standing balance-Leahy Scale: Fair Standing balance comment: fair/good using RW                            Cognition Arousal/Alertness: Awake/alert Behavior During Therapy: WFL for tasks assessed/performed Overall Cognitive Status: Within Functional Limits for tasks assessed                                        Exercises General Exercises - Lower Extremity Long Arc  Quad: Seated;AROM;Strengthening;Both;10 reps Hip Flexion/Marching: Seated;AROM;Strengthening;Both;10 reps Toe Raises: Seated;AROM;Strengthening;Both;10 reps Heel Raises: Seated;AROM;Strengthening;Both;10 reps    General Comments        Pertinent Vitals/Pain Pain Assessment: No/denies pain    Home Living                      Prior Function            PT  Goals (current goals can now be found in the care plan section) Acute Rehab PT Goals Patient Stated Goal: return home PT Goal Formulation: With patient Time For Goal Achievement: 02/04/19 Potential to Achieve Goals: Good Progress towards PT goals: Progressing toward goals    Frequency    Min 3X/week      PT Plan Current plan remains appropriate    Co-evaluation              AM-PAC PT "6 Clicks" Mobility   Outcome Measure  Help needed turning from your back to your side while in a flat bed without using bedrails?: None Help needed moving from lying on your back to sitting on the side of a flat bed without using bedrails?: None Help needed moving to and from a bed to a chair (including a wheelchair)?: A Little Help needed standing up from a chair using your arms (e.g., wheelchair or bedside chair)?: A Little Help needed to walk in hospital room?: A Little Help needed climbing 3-5 steps with a railing? : A Lot 6 Click Score: 19    End of Session Equipment Utilized During Treatment: Oxygen Activity Tolerance: Patient tolerated treatment well Patient left: in chair;with call bell/phone within reach Nurse Communication: Mobility status PT Visit Diagnosis: Unsteadiness on feet (R26.81);Other abnormalities of gait and mobility (R26.89);Muscle weakness (generalized) (M62.81)     Time: 3704-8889 PT Time Calculation (min) (ACUTE ONLY): 24 min  Charges:  $Therapeutic Exercise: 8-22 mins $Therapeutic Activity: 8-22 mins                     2:50 PM, 01/22/19 Lonell Grandchild, MPT Physical Therapist with [facility] [phone] office [phone] mobile phone

## 2019-01-23 LAB — PROCALCITONIN: Procalcitonin: <0.1 ng/mL

## 2019-01-23 MED ORDER — DIPHENHYDRAMINE-ZINC ACETATE 2-0.1 % EX CREA
TOPICAL_CREAM | Freq: Three times a day (TID) | CUTANEOUS | Status: DC | PRN
  Administered 2019-01-23 – 2019-01-25 (×2): via TOPICAL
  Administered 2019-01-25: 1 via TOPICAL
  Filled 2019-01-23: qty 28

## 2019-01-23 MED ORDER — ACETYLCYSTEINE 20 % IN SOLN
1.0000 mL | Freq: Two times a day (BID) | RESPIRATORY_TRACT | Status: DC
  Administered 2019-01-23 – 2019-01-25 (×4): 1 mL via RESPIRATORY_TRACT
  Administered 2019-01-25: 4 mL via RESPIRATORY_TRACT
  Administered 2019-01-26 (×2): 1 mL via RESPIRATORY_TRACT
  Administered 2019-01-27: 4 mL via RESPIRATORY_TRACT
  Administered 2019-01-27 – 2019-01-30 (×6): 1 mL via RESPIRATORY_TRACT
  Filled 2019-01-23 (×15): qty 4

## 2019-01-23 MED ORDER — POTASSIUM CHLORIDE CRYS ER 20 MEQ PO TBCR
20.0000 meq | EXTENDED_RELEASE_TABLET | Freq: Once | ORAL | Status: AC
  Administered 2019-01-23: 12:00:00 20 meq via ORAL
  Filled 2019-01-23: qty 1

## 2019-01-23 NOTE — Progress Notes (Signed)
Subjective: She says she feels okay except she is itching.  She does not have any other new complaints.  She is still requiring high flow nasal cannula and is on 65% but oxygenation is in the mid 90s so I think we can go down.  No other new complaints.  She is still coughing some.  Objective: Vital signs in last 24 hours: Temp:  [97.5 F (36.4 C)-98.5 F (36.9 C)] 98.5 F (36.9 C) (09/24 0400) Pulse Rate:  [65-83] 75 (09/24 0100) Resp:  [16-25] 16 (09/24 0100) BP: (142-170)/(69-89) 160/85 (09/24 0100) SpO2:  [82 %-92 %] 90 % (09/24 0501) FiO2 (%):  [60 %-65 %] 65 % (09/24 0501) Weight:  [132.3 kg] 132.3 kg (09/24 0500) Weight change: -1.5 kg Last BM Date: 01/20/19  Intake/Output from previous day: No intake/output data recorded.  PHYSICAL EXAM General appearance: alert, cooperative, no distress and morbidly obese Resp: rhonchi bilaterally Cardio: regular rate and rhythm, S1, S2 normal, no murmur, click, rub or gallop GI: soft, non-tender; bowel sounds normal; no masses,  no organomegaly Extremities: extremities normal, atraumatic, no cyanosis or edema  Lab Results:  Results for orders placed or performed during the hospital encounter of 01/13/19 (from the past 48 hour(s))  Basic metabolic panel     Status: Abnormal   Collection Time: 01/22/19  8:58 AM  Result Value Ref Range   Sodium 140 135 - 145 mmol/L   Potassium 3.4 (L) 3.5 - 5.1 mmol/L   Chloride 104 98 - 111 mmol/L   CO2 28 22 - 32 mmol/L   Glucose, Bld 109 (H) 70 - 99 mg/dL   BUN 12 6 - 20 mg/dL   Creatinine, Ser 0.92 0.44 - 1.00 mg/dL   Calcium 8.3 (L) 8.9 - 10.3 mg/dL   GFR calc non Af Amer >60 >60 mL/min   GFR calc Af Amer >60 >60 mL/min   Anion gap 8 5 - 15    Comment: Performed at [facility], [address], [place], [place] [address]  CBC     Status: Abnormal   Collection Time: 01/22/19  8:58 AM  Result Value Ref Range   WBC 11.4 (H) 4.0 - 10.5 K/uL   RBC 4.44 3.87 - 5.11 MIL/uL   Hemoglobin 14.2 12.0  - 15.0 g/dL   HCT 45.0 36.0 - 46.0 %   MCV 101.4 (H) 80.0 - 100.0 fL   MCH 32.0 26.0 - 34.0 pg   MCHC 31.6 30.0 - 36.0 g/dL   RDW 14.3 11.5 - 15.5 %   Platelets 216 150 - 400 K/uL   nRBC 0.0 0.0 - 0.2 %    Comment: Performed at [facility], [address], [place], [place] [address]  Brain natriuretic peptide     Status: None   Collection Time: 01/22/19  8:58 AM  Result Value Ref Range   B Natriuretic Peptide 68.0 0.0 - 100.0 pg/mL    Comment: Performed at [facility], [address], [place], [place] [address]    ABGS No results for input(s): PHART, PO2ART, TCO2, HCO3 in the last 72 hours.  Invalid input(s): PCO2 CULTURES Recent Results (from the past 240 hour(s))  SARS Coronavirus 2 [facility] order, Performed in [facility] hospital lab) Nasopharyngeal Nasopharyngeal Swab     Status: None   Collection Time: 01/13/19  3:59 PM   Specimen: Nasopharyngeal Swab  Result Value Ref Range Status   SARS Coronavirus 2 NEGATIVE NEGATIVE Final    Comment: (NOTE) If result is NEGATIVE SARS-CoV-2 target  nucleic acids are NOT DETECTED. The SARS-CoV-2 RNA is generally detectable in upper and lower  respiratory specimens during the acute phase of infection. The lowest  concentration of SARS-CoV-2 viral copies this assay can detect is 250  copies / mL. A negative result does not preclude SARS-CoV-2 infection  and should not be used as the sole basis for treatment or other  patient management decisions.  A negative result may occur with  improper specimen collection / handling, submission of specimen other  than nasopharyngeal swab, presence of viral mutation(s) within the  areas targeted by this assay, and inadequate number of viral copies  (<250 copies / mL). A negative result must be combined with clinical  observations, patient history, and epidemiological information. If result is POSITIVE SARS-CoV-2 target nucleic acids are DETECTED. The SARS-CoV-2 RNA is generally detectable in upper  and lower  respiratory specimens dur ing the acute phase of infection.  Positive  results are indicative of active infection with SARS-CoV-2.  Clinical  correlation with patient history and other diagnostic information is  necessary to determine patient infection status.  Positive results do  not rule out bacterial infection or co-infection with other viruses. If result is PRESUMPTIVE POSTIVE SARS-CoV-2 nucleic acids MAY BE PRESENT.   A presumptive positive result was obtained on the submitted specimen  and confirmed on repeat testing.  While 2019 novel coronavirus  (SARS-CoV-2) nucleic acids may be present in the submitted sample  additional confirmatory testing may be necessary for epidemiological  and / or clinical management purposes  to differentiate between  SARS-CoV-2 and other Sarbecovirus currently known to infect humans.  If clinically indicated additional testing with an alternate test  methodology [phone]) is advised. The SARS-CoV-2 RNA is generally  detectable in upper and lower respiratory sp ecimens during the acute  phase of infection. The expected result is Negative. Fact Sheet for Patients:  StrictlyIdeas.no Fact Sheet for Healthcare Providers: BankingDealers.co.za This test is not yet approved or cleared by the [place] FDA and has been authorized for detection and/or diagnosis of SARS-CoV-2 by FDA under an Emergency Use Authorization (EUA).  This EUA will remain in effect (meaning this test can be used) for the duration of the COVID-19 declaration under Section 564(b)(1) of the Act, 21 U.S.C. section 360bbb-3(b)(1), unless the authorization is terminated or revoked sooner. Performed at [facility], [address], [place], [place] [address]   Surgical pcr screen     Status: None   Collection Time: 01/14/19  7:41 PM   Specimen: Nasal Mucosa; Nasal Swab  Result Value Ref Range Status   MRSA, PCR NEGATIVE  NEGATIVE Final   Staphylococcus aureus NEGATIVE NEGATIVE Final    Comment: (NOTE) The Xpert SA Assay (FDA approved for NASAL specimens in patients 65 years of age and older), is one component of a comprehensive surveillance program. It is not intended to diagnose infection nor to guide or monitor treatment. Performed at [facility], [address], [place], [place] [address]   MRSA PCR Screening     Status: None   Collection Time: 01/15/19  2:15 PM   Specimen: Nasal Mucosa; Nasopharyngeal  Result Value Ref Range Status   MRSA by PCR NEGATIVE NEGATIVE Final    Comment:        The GeneXpert MRSA Assay (FDA approved for NASAL specimens only), is one component of a comprehensive MRSA colonization surveillance program. It is not intended to diagnose MRSA infection nor to guide or monitor treatment for MRSA infections. Performed  at [facility], [address] [place], [name] [address]    Studies/Results: Dg Chest Port 1 View  Result Date: 01/22/2019 CLINICAL DATA:  Acute respiratory failure with hypoxia, history COPD, hypertension EXAM: PORTABLE CHEST 1 VIEW COMPARISON:  Portable exam 0941 hours compared to 01/20/2019 FINDINGS: Enlargement of cardiac silhouette. BILATERAL pulmonary infiltrates in the mid to lower lungs. Associated small RIGHT pleural effusion and basilar atelectasis. Lung apices clear. No pneumothorax. IMPRESSION: Bibasilar infiltrates slightly increased from previous exam with associated small RIGHT pleural effusion and mild RIGHT basilar atelectasis. Electronically Signed   By: Lavonia Dana M.D.   On: 01/22/2019 10:03    Medications:  Prior to Admission:  Medications Prior to Admission  Medication Sig Dispense Refill Last Dose  . albuterol (PROVENTIL) (2.5 MG/3ML) 0.083% nebulizer solution Take 2.5 mg by nebulization every 6 (six) hours as needed for wheezing or shortness of breath.   unknown  . albuterol (VENTOLIN HFA) 108 (90 Base) MCG/ACT inhaler Inhale 1-2  puffs into the lungs every 6 (six) hours as needed for wheezing or shortness of breath.   unknown  . cyclobenzaprine (FLEXERIL) 10 MG tablet Take 10 mg by mouth daily as needed for muscle spasms.   Past Month at Unknown time  . diphenhydrAMINE HCl (ALLERGY MEDICATION PO) Take 1 tablet by mouth at bedtime.   01/12/2019 at Unknown time  . gabapentin (NEURONTIN) 300 MG capsule Take 600 mg by mouth at bedtime.    01/12/2019 at Unknown time  . ibuprofen (ADVIL) 200 MG tablet Take 400 mg by mouth at bedtime.   01/12/2019 at Unknown time  . lisinopril-hydrochlorothiazide (ZESTORETIC) 10-12.5 MG tablet Take 1 tablet by mouth every morning.   01/13/2019 at Unknown time   Scheduled: . budesonide (PULMICORT) nebulizer solution  0.5 mg Nebulization BID  . Chlorhexidine Gluconate Cloth  6 each Topical Daily  . cyanocobalamin  1,000 mcg Intramuscular Daily  . enoxaparin (LOVENOX) injection  40 mg Subcutaneous Q24H  . folic acid  1 mg Oral Daily  . guaiFENesin  1,200 mg Oral BID  . ipratropium-albuterol  3 mL Nebulization Q4H  . lisinopril  20 mg Oral Daily  . magnesium hydroxide  30 mL Oral BID  . mouth rinse  15 mL Mouth Rinse BID  . methylPREDNISolone (SOLU-MEDROL) injection  40 mg Intravenous Q12H  . saccharomyces boulardii  250 mg Oral BID  . sodium chloride flush  3 mL Intravenous Q12H   Continuous: . sodium chloride     PPJ:KDTOIZ chloride, acetaminophen **OR** acetaminophen, diphenhydrAMINE **OR** diphenhydrAMINE, fentaNYL (SUBLIMAZE) injection, guaiFENesin-dextromethorphan, ipratropium-albuterol, labetalol, LORazepam, ondansetron **OR** ondansetron (ZOFRAN) IV, polyethylene glycol, polyvinyl alcohol, simethicone, sodium chloride flush, traMADol  Assesment: She was admitted with small bowel obstruction that was from an incisional hernia and she eventually underwent hernia repair and omentectomy.  She is done well postop.  She had acute hypoxic respiratory failure probably on chronic hypoxic  respiratory failure and she is better.  She still has very high oxygen requirements.  She has COPD exacerbation and she was started on steroids yesterday.  She also has an element of aspiration pneumonia and she is being treated for that.  She complains of itching and that is keeping her from being comfortable Principal Problem:   Small bowel obstruction (HCC) Active Problems:   COPD (chronic obstructive pulmonary disease) (HCC)   Hypertension   Acute respiratory failure with hypoxia (HCC)   Polycythemia   COPD with acute exacerbation (HCC)   Incisional hernia, without obstruction or gangrene  Aspiration pneumonitis ([place])    Plan: Continue current treatments.  Continue trying to wean oxygen.  I turned her down to 60%.    LOS: 9 days   Veronica Horton 01/23/2019, 7:16 AM

## 2019-01-23 NOTE — Progress Notes (Signed)
Physical Therapy Treatment Patient Details Name: Veronica Horton MRN: [id number] DOB: 1961-01-10 Today's Date: 01/23/2019    History of Present Illness Veronica Horton is a 58 y.o. female with medical history significant for COPD, hypertension, hiatal hernia, and history of cholecystectomy, now presenting to the emergency department for evaluation of abdominal pain with nausea and vomiting.  Patient reports that she been in her usual state of health until last night when she developed abdominal pain with nausea and vomiting.  Pain and nausea/vomiting persisted throughout the night and through today, not necessarily worsening, but not improving either.  She also had a marked increase in her chronic cough last night accompanied by thick sputum production.  She does not feel particularly short of breath.  She has not noted any lower extremity swelling or tenderness.  She denies any fevers or chills.  She has not had these symptoms previously.  She denies diarrhea, melena, or hematochezia.    PT Comments    Patient excited to participate in physical therapy today. Performed seated LE exercises and patient needed verbal cues throughout to remind her to breath during exercises and to perform pursed lip breathing. During LAQs, O2 saturation dropped down to 69% while on oxygen. Patient most likely was holding her breath during exercise. Took about 1.5 minutes for O2 saturation to return to baseline of 85-89% with pursed lip breathing. Supervision required tfor sit to stand transfer and stepping forwards and backwards at bedside. Improved O2 noted during ambulation especially when patient was cued to "stand upright." Patient returned to chair at end of session and discussed O2 levels with nursing. Patient would continue to benefit from skilled physical therapy focusing on improving functional tolerance and strength. Plan still appropriate for patient at this time.    Follow Up Recommendations  Home health  PT;Supervision - Intermittent     Equipment Recommendations  3in1 (PT)    Recommendations for Other Services       Precautions / Restrictions Precautions Precautions: Fall Restrictions Weight Bearing Restrictions: No    Mobility  Bed Mobility               General bed mobility comments: Patient presents in chair (assisted by nursing staff)  Transfers Overall transfer level: Needs assistance Equipment used: Rolling walker (2 wheeled) Transfers: Sit to/from Stand Sit to Stand: Supervision;Min guard         General transfer comment: increased time, slightly labored movement, verbal cues to remind her of pursed lip breathing  Ambulation/Gait Ambulation/Gait assistance: Supervision Gait Distance (Feet): 20 Feet Assistive device: Rolling walker (2 wheeled) Gait Pattern/deviations: Decreased step length - right;Decreased step length - left;Decreased stride length Gait velocity: slow   General Gait Details: limited to 18-20 slow slilghtly labored forward/backward steps at bedside while on 35.2 LPM high flow O2 with SpO2 between 80-88% during ambulation,   Stairs             Wheelchair Mobility    Modified Rankin (Stroke Patients Only)       Balance Overall balance assessment: Needs assistance Sitting-balance support: Feet supported;No upper extremity supported Sitting balance-Leahy Scale: Good Sitting balance - Comments: seated in chair   Standing balance support: During functional activity;Bilateral upper extremity supported Standing balance-Leahy Scale: Fair Standing balance comment: fair/good using RW                            Cognition Arousal/Alertness: Awake/alert Behavior During Therapy: WFL for tasks  assessed/performed Overall Cognitive Status: Within Functional Limits for tasks assessed                                        Exercises General Exercises - Lower Extremity Long Arc Quad:  Seated;AROM;Strengthening;Both;20 reps Hip Flexion/Marching: Seated;AROM;Strengthening;Both;20 reps Toe Raises: Seated;AROM;Strengthening;Both;20 reps Heel Raises: Seated;AROM;Strengthening;Both;20 reps    General Comments        Pertinent Vitals/Pain Pain Assessment: No/denies pain    Home Living                      Prior Function            PT Goals (current goals can now be found in the care plan section) Acute Rehab PT Goals Patient Stated Goal: return home PT Goal Formulation: With patient Time For Goal Achievement: 02/04/19 Potential to Achieve Goals: Good Progress towards PT goals: Progressing toward goals    Frequency    Min 3X/week      PT Plan Current plan remains appropriate    Co-evaluation              AM-PAC PT "6 Clicks" Mobility   Outcome Measure  Help needed turning from your back to your side while in a flat bed without using bedrails?: None Help needed moving from lying on your back to sitting on the side of a flat bed without using bedrails?: None Help needed moving to and from a bed to a chair (including a wheelchair)?: A Little Help needed standing up from a chair using your arms (e.g., wheelchair or bedside chair)?: A Little Help needed to walk in hospital room?: A Little Help needed climbing 3-5 steps with a railing? : A Lot 6 Click Score: 19    End of Session Equipment Utilized During Treatment: Oxygen Activity Tolerance: Patient tolerated treatment well Patient left: in chair;with call bell/phone within reach Nurse Communication: Mobility status PT Visit Diagnosis: Unsteadiness on feet (R26.81);Other abnormalities of gait and mobility (R26.89);Muscle weakness (generalized) (M62.81)     Time: 5997-7414 PT Time Calculation (min) (ACUTE ONLY): 20 min  Charges:  $Therapeutic Activity: 8-22 mins                      3:50 PM, 01/23/19 Jerene Pitch, DPT Physical Therapy with [facility]   [phone] office

## 2019-01-23 NOTE — Progress Notes (Signed)
PROGRESS NOTE  NAVIAH BELFIELD DPO:[id number] DOB: 1960-10-27 DOA: 01/13/2019 PCP: Sharilyn Sites, MD   Brief History: 58 y/o female with history of COPD, HTN, tobacco abuse, morbid obesity presenting with abdominal pain with nausea and vomiting that started evening 01/12/19 and progressively worsened throughout the day on 01/13/19. She also has had increasing cough with clear sputum x 1-2 days. She denies f/c, cp, hemoptysis, dysuria, hematuria, hematochezia, melena. She had some dyspnea on exertion, but stated it was not any worse than usual. Her last BM was on 01/12/19. She denies any new medications, but continues to smoke 1ppd.  In the ED, she was afebrile and hemodynamically stable. BMP, LFTs and CBC were essentially unremarkable except some polycythemia and WBC 11.3. The patient was afebrile hemodynamically stable but was hypoxic with oxygen saturation 87% on room air. CT of abd/pelvis showed SBO secondary to a large paraumbilical ventral hernia without strangulation. NG was placed for decompression. General surgery was consulted to assist.  Assessment/Plan: SBO -NG tube placed for decompression initially -01/15/19--incisional herniorrhaphy with mesh and omentectomy -having BMs and tolerating diet post-op -Optimize electrolytes  Acute respiratory failure with hypoxia -Secondary to COPD exacerbation with a component of aspiration pneumonitis -appreciate pulmonary consult -repeat CXR--personally reviewed--bibasilar opacities -BNP--66 -Stable on HF [place] presently at 60% -Wean oxygen for saturation greater 85-88% -add mucomyst  COPD exacerbation -Continue Pulmicort -Continue duo nebs -reStarted IV Solu-Medrol 9/23  Essential hypertension -Restarted lisinopril -Hydralazine as neededSBP>180  Peripheral neuropathy -Serum E31--540 -Folic GQQP--6.1 -supplement B12 and folate  Hypokalemia -replete -check mag     Disposition Plan: remain in SDU  due to high oxygen requirement requirement  Family Communication:NoFamily at bedside  Consultants:General surgery, pulmonary  Code Status: FULL   DVT Prophylaxis: [place] Heparin    Procedures: As Listed in Progress Note Above  Antibiotics:       Subjective: Patient denies fevers, chills, headache, chest pain, dyspnea, nausea, vomiting, diarrhea, abdominal pain, dysuria, hematuria, hematochezia, and melena. She had 2 BMs in last 24 hours.  Has cough with yellow sputum  Objective: Vitals:   01/23/19 0600 01/23/19 0700 01/23/19 0751 01/23/19 0814  BP: (!) 171/79 (!) 143/70    Pulse: 63 (!) 57    Resp: 18 20    Temp:   98.4 F (36.9 C)   TempSrc:   Oral   SpO2: (!) 88% 93%  90%  Weight:      Height:       No intake or output data in the 24 hours ending 01/23/19 0946 Weight change: -1.5 kg Exam:   General:  Pt is alert, follows commands appropriately, not in acute distress  HEENT: No icterus, No thrush, No neck mass, [place]/AT  Cardiovascular: RRR, S1/S2, no rubs, no gallops  Respiratory: bibasilar rales, no wheeze  Abdomen: Soft/+BS, non tender, non distended, no guarding  Extremities: trace LEedema, No lymphangitis, No petechiae, No rashes, no synovitis   Data Reviewed: I have personally reviewed following labs and imaging studies Basic Metabolic Panel: Recent Labs  Lab 01/17/19 0456 01/18/19 0501 01/22/19 0858  NA 137 141 140  K 4.1 4.1 3.4*  CL 102 103 104  CO2 25 30 28   GLUCOSE 89 97 109*  BUN 30* 20 12  CREATININE 1.01* 0.92 0.92  CALCIUM 8.4* 8.5* 8.3*  MG 2.3 2.4  --    Liver Function Tests: Recent Labs  Lab 01/18/19 0501  AST 14*  ALT 16  ALKPHOS 52  BILITOT 1.0  PROT 6.1*  ALBUMIN 3.0*   No results for input(s): LIPASE, AMYLASE in the last 168 hours. No results for input(s): AMMONIA in the last 168 hours. Coagulation Profile: No results for input(s): INR, PROTIME in the last 168 hours. CBC: Recent Labs  Lab  01/17/19 0456 01/18/19 0501 01/22/19 0858  WBC 14.2* 11.7* 11.4*  NEUTROABS  --  9.2*  --   HGB 14.9 14.0 14.2  HCT 46.9* 44.4 45.0  MCV 102.2* 102.1* 101.4*  PLT 191 184 216   Cardiac Enzymes: No results for input(s): CKTOTAL, CKMB, CKMBINDEX, TROPONINI in the last 168 hours. BNP: Invalid input(s): POCBNP CBG: No results for input(s): GLUCAP in the last 168 hours. HbA1C: No results for input(s): HGBA1C in the last 72 hours. Urine analysis:    Component Value Date/Time   COLORURINE YELLOW 01/13/2019 1730   APPEARANCEUR CLOUDY (A) 01/13/2019 1730   LABSPEC 1.017 01/13/2019 1730   PHURINE 8.0 01/13/2019 1730   GLUCOSEU NEGATIVE 01/13/2019 1730   HGBUR NEGATIVE 01/13/2019 1730   BILIRUBINUR NEGATIVE 01/13/2019 1730   KETONESUR NEGATIVE 01/13/2019 1730   PROTEINUR 100 (A) 01/13/2019 1730   UROBILINOGEN 0.2 09/25/2010 0530   NITRITE NEGATIVE 01/13/2019 1730   LEUKOCYTESUR NEGATIVE 01/13/2019 1730   Sepsis Labs: @LABRCNTIP (procalcitonin:4,lacticidven:4) ) Recent Results (from the past 240 hour(s))  SARS Coronavirus 2 [facility] order, Performed in [facility] hospital lab) Nasopharyngeal Nasopharyngeal Swab     Status: None   Collection Time: 01/13/19  3:59 PM   Specimen: Nasopharyngeal Swab  Result Value Ref Range Status   SARS Coronavirus 2 NEGATIVE NEGATIVE Final    Comment: (NOTE) If result is NEGATIVE SARS-CoV-2 target nucleic acids are NOT DETECTED. The SARS-CoV-2 RNA is generally detectable in upper and lower  respiratory specimens during the acute phase of infection. The lowest  concentration of SARS-CoV-2 viral copies this assay can detect is 250  copies / mL. A negative result does not preclude SARS-CoV-2 infection  and should not be used as the sole basis for treatment or other  patient management decisions.  A negative result may occur with  improper specimen collection / handling, submission of specimen other  than nasopharyngeal swab, presence of viral  mutation(s) within the  areas targeted by this assay, and inadequate number of viral copies  (<250 copies / mL). A negative result must be combined with clinical  observations, patient history, and epidemiological information. If result is POSITIVE SARS-CoV-2 target nucleic acids are DETECTED. The SARS-CoV-2 RNA is generally detectable in upper and lower  respiratory specimens dur ing the acute phase of infection.  Positive  results are indicative of active infection with SARS-CoV-2.  Clinical  correlation with patient history and other diagnostic information is  necessary to determine patient infection status.  Positive results do  not rule out bacterial infection or co-infection with other viruses. If result is PRESUMPTIVE POSTIVE SARS-CoV-2 nucleic acids MAY BE PRESENT.   A presumptive positive result was obtained on the submitted specimen  and confirmed on repeat testing.  While 2019 novel coronavirus  (SARS-CoV-2) nucleic acids may be present in the submitted sample  additional confirmatory testing may be necessary for epidemiological  and / or clinical management purposes  to differentiate between  SARS-CoV-2 and other Sarbecovirus currently known to infect humans.  If clinically indicated additional testing with an alternate test  methodology [phone]) is advised. The SARS-CoV-2 RNA is generally  detectable in upper and lower respiratory sp ecimens during the acute  phase of  infection. The expected result is Negative. Fact Sheet for Patients:  StrictlyIdeas.no Fact Sheet for Healthcare Providers: BankingDealers.co.za This test is not yet approved or cleared by the [place] FDA and has been authorized for detection and/or diagnosis of SARS-CoV-2 by FDA under an Emergency Use Authorization (EUA).  This EUA will remain in effect (meaning this test can be used) for the duration of the COVID-19 declaration under Section 564(b)(1)  of the Act, 21 U.S.C. section 360bbb-3(b)(1), unless the authorization is terminated or revoked sooner. Performed at [facility], [address] [place], [place] [address]   Surgical pcr screen     Status: None   Collection Time: 01/14/19  7:41 PM   Specimen: Nasal Mucosa; Nasal Swab  Result Value Ref Range Status   MRSA, PCR NEGATIVE NEGATIVE Final   Staphylococcus aureus NEGATIVE NEGATIVE Final    Comment: (NOTE) The Xpert SA Assay (FDA approved for NASAL specimens in patients 61 years of age and older), is one component of a comprehensive surveillance program. It is not intended to diagnose infection nor to guide or monitor treatment. Performed at [facility], [address] [place], [place] [address]   MRSA PCR Screening     Status: None   Collection Time: 01/15/19  2:15 PM   Specimen: Nasal Mucosa; Nasopharyngeal  Result Value Ref Range Status   MRSA by PCR NEGATIVE NEGATIVE Final    Comment:        The GeneXpert MRSA Assay (FDA approved for NASAL specimens only), is one component of a comprehensive MRSA colonization surveillance program. It is not intended to diagnose MRSA infection nor to guide or monitor treatment for MRSA infections. Performed at [facility], [address] [place], [place] [address]      Scheduled Meds: . budesonide (PULMICORT) nebulizer solution  0.5 mg Nebulization BID  . Chlorhexidine Gluconate Cloth  6 each Topical Daily  . cyanocobalamin  1,000 mcg Intramuscular Daily  . enoxaparin (LOVENOX) injection  40 mg Subcutaneous Q24H  . folic acid  1 mg Oral Daily  . guaiFENesin  1,200 mg Oral BID  . ipratropium-albuterol  3 mL Nebulization Q4H  . lisinopril  20 mg Oral Daily  . magnesium hydroxide  30 mL Oral BID  . mouth rinse  15 mL Mouth Rinse BID  . methylPREDNISolone (SOLU-MEDROL) injection  40 mg Intravenous Q12H  . potassium chloride  20 mEq Oral Once  . saccharomyces boulardii  250 mg Oral BID  . sodium chloride flush  3 mL  Intravenous Q12H   Continuous Infusions: . sodium chloride      Procedures/Studies: Ct Angio Chest Pe W Or Wo Contrast  Result Date: 01/17/2019 CLINICAL DATA:  Dyspnea chronically. Two days postop small-bowel obstruction and hernia repair. EXAM: CT ANGIOGRAPHY CHEST WITH CONTRAST TECHNIQUE: Multidetector CT imaging of the chest was performed using the standard protocol during bolus administration of intravenous contrast. Multiplanar CT image reconstructions and MIPs were obtained to evaluate the vascular anatomy. CONTRAST:  129mL OMNIPAQUE IOHEXOL 350 MG/ML SOLN COMPARISON:  Chest CT 10/03/2010 and abdominal CT 01/13/2019 FINDINGS: Cardiovascular: Mild cardiomegaly. Minimal calcified plaque over the left anterior descending coronary artery. Thoracic aorta is normal. Pulmonary arterial system is well opacified without evidence of emboli. Aberrant right subclavian artery. Remaining vascular structures are unremarkable. Mediastinum/Nodes: No significant mediastinal or hilar adenopathy. Lungs/Pleura: Lungs are adequately inflated demonstrate moderate consolidation over the lower lobes bilaterally likely atelectasis. Mild atelectatic change over the right middle lobe and lingula. Subtle dependent atelectasis over the  posterior right upper lobe. No significant effusion. Dependent debris over the distal trachea and right mainstem bronchus likely aspirate material. Obstructed right posterior lower lobe bronchus which may be due to mucous plugging versus aspiration. Narrowed right lower lobe bronchi. Upper Abdomen: No acute findings. Musculoskeletal: Degenerative change of the spine. Review of the MIP images confirms the above findings. IMPRESSION: 1.  No evidence of pulmonary embolism. 2. Significant bilateral lower lobe consolidation likely atelectasis. Mild atelectasis over the right middle lobe and lingula as well as minimal dependent atelectasis over the right upper lobe. No effusion. Aspirate material over the  distal trachea and right mainstem bronchus. 3. Cardiomegaly with minimal atherosclerotic coronary artery disease. Electronically Signed   By: Marin Olp M.D.   On: 01/17/2019 10:12   Ct Abdomen Pelvis W Contrast  Result Date: 01/13/2019 CLINICAL DATA:  Right-sided abdominal pain and nausea and vomiting beginning last night. Prior cholecystectomy. EXAM: CT ABDOMEN AND PELVIS WITH CONTRAST TECHNIQUE: Multidetector CT imaging of the abdomen and pelvis was performed using the standard protocol following bolus administration of intravenous contrast. CONTRAST:  157mL OMNIPAQUE IOHEXOL 300 MG/ML  SOLN COMPARISON:  05/14/2012 FINDINGS: Lower Chest: Increased linear opacity in both lung bases since 2014, which may be due to subsegmental atelectasis or scarring. Hepatobiliary: No hepatic masses identified. Prior cholecystectomy. No evidence of biliary obstruction. Pancreas:  No mass or inflammatory changes. Spleen: Within normal limits in size and appearance. Adrenals/Urinary Tract: No masses identified. Several small left renal cysts again noted. No evidence of hydronephrosis. Stomach/Bowel: Large paraumbilical ventral hernia is again seen containing multiple small bowel loops. Moderate dilatation of small bowel is seen within the hernia and proximal to the hernia, consistent with small-bowel obstruction. No evidence of small bowel wall thickening or pneumatosis. No evidence of inflammatory process or abnormal fluid collections. Diverticulosis is seen mainly involving the descending and sigmoid colon, however there is no evidence of diverticulitis. Vascular/Lymphatic: No pathologically enlarged lymph nodes. No abdominal aortic aneurysm. Aortic atherosclerosis. Reproductive:  No mass or other significant abnormality. Other:  None. Musculoskeletal:  No suspicious bone lesions identified. IMPRESSION: 1. Small bowel obstruction due to large paraumbilical ventral hernia, which contains multiple small bowel loops. No signs  of bowel strangulation. 2. Colonic diverticulosis, without radiographic evidence of diverticulitis. Aortic Atherosclerosis (ICD10-I70.0). Electronically Signed   By: Marlaine Hind M.D.   On: 01/13/2019 18:50   Dg Chest Port 1 View  Result Date: 01/22/2019 CLINICAL DATA:  Acute respiratory failure with hypoxia, history COPD, hypertension EXAM: PORTABLE CHEST 1 VIEW COMPARISON:  Portable exam 0941 hours compared to 01/20/2019 FINDINGS: Enlargement of cardiac silhouette. BILATERAL pulmonary infiltrates in the mid to lower lungs. Associated small RIGHT pleural effusion and basilar atelectasis. Lung apices clear. No pneumothorax. IMPRESSION: Bibasilar infiltrates slightly increased from previous exam with associated small RIGHT pleural effusion and mild RIGHT basilar atelectasis. Electronically Signed   By: Lavonia Dana M.D.   On: 01/22/2019 10:03   Dg Chest Port 1 View  Result Date: 01/20/2019 CLINICAL DATA:  Hypoxia. EXAM: PORTABLE CHEST 1 VIEW COMPARISON:  Radiograph of January 17, 2019. FINDINGS: Stable cardiomegaly. No pneumothorax is noted. Stable bibasilar atelectasis or infiltrates are noted with possible small pleural effusions. Bony thorax is unremarkable. IMPRESSION: Stable bibasilar opacities and small effusions as described above Electronically Signed   By: Marijo Conception M.D.   On: 01/20/2019 15:01   Dg Chest Port 1 View  Result Date: 01/17/2019 CLINICAL DATA:  Acute respiratory failure. EXAM: PORTABLE CHEST 1 VIEW COMPARISON:  Radiograph of January 16, 2019. FINDINGS: Stable cardiomediastinal silhouette. No pneumothorax is noted. Stable bibasilar atelectasis or infiltrates are noted. Small bilateral pleural effusions are noted as well. Bony thorax unremarkable. IMPRESSION: Stable bibasilar opacities as described above. Electronically Signed   By: Marijo Conception M.D.   On: 01/17/2019 07:36   Dg Chest Port 1 View  Result Date: 01/16/2019 CLINICAL DATA:  Hypoxia today. EXAM: PORTABLE CHEST  1 VIEW COMPARISON:  Single-view of the chest 01/13/2019. PA and lateral chest 09/25/2010. CT chest 10/03/2010. FINDINGS: NG tube has been removed. There is cardiomegaly and interstitial edema. Right pleural effusion and basilar airspace disease have increased. Left basilar airspace disease is mildly improved. No pneumothorax. IMPRESSION: No marked change in cardiomegaly and interstitial edema. Increased small right pleural effusion and basilar atelectasis. Left pleural effusion is slightly decreased. Electronically Signed   By: Inge Rise M.D.   On: 01/16/2019 09:34   Dg Chest Portable 1 View  Result Date: 01/13/2019 CLINICAL DATA:  NG tube placement EXAM: PORTABLE CHEST 1 VIEW COMPARISON:  Radiograph 01/13/2019 FINDINGS: Interval placement of a transesophageal tube with tip and side port distal to the GE junction, curling within the gastric lumen in the left upper quadrant. Redemonstration of the diffuse interstitial opacities and moderate cardiomegaly. No pneumothorax. No new consolidative process. No acute osseous or soft tissue abnormality. Degenerative changes are present in the imaged spine and shoulders. IMPRESSION: 1. Appropriate positioning of the transesophageal tube with the tip and side port distal to the GE junction 2. Slightly diminished lung volumes with persistent interstitial pulmonary edema and cardiomegaly. Electronically Signed   By: Lovena Le M.D.   On: 01/13/2019 20:15   Dg Chest Portable 1 View  Result Date: 01/13/2019 CLINICAL DATA:  Cough. Nausea and vomiting. Right-sided abdominal pain. EXAM: PORTABLE CHEST 1 VIEW COMPARISON:  09/25/2010 FINDINGS: Mild-to-moderate cardiomegaly. Diffuse interstitial infiltrates, suspicious for interstitial edema. Stable linear opacity in right lower lung, consistent with scarring. No evidence of pulmonary consolidation or pleural effusion. IMPRESSION: Cardiomegaly and diffuse interstitial infiltrates, suspicious for mild congestive heart  failure. Electronically Signed   By: Marlaine Hind M.D.   On: 01/13/2019 16:41    Orson Eva, DO  [facility] Pager [phone]  If 7PM-7AM, please contact night-coverage [email or web address] Password [email or web address] 01/23/2019, 9:46 AM   LOS: 9 days

## 2019-01-24 ENCOUNTER — Inpatient Hospital Stay (HOSPITAL_COMMUNITY): Payer: Self-pay

## 2019-01-24 LAB — BASIC METABOLIC PANEL
Anion gap: 8 (ref 5–15)
BUN: 26 mg/dL — ABNORMAL HIGH (ref 6–20)
CO2: 26 mmol/L (ref 22–32)
Calcium: 8.7 mg/dL — ABNORMAL LOW (ref 8.9–10.3)
Chloride: 105 mmol/L (ref 98–111)
Creatinine, Ser: 1.01 mg/dL — ABNORMAL HIGH (ref 0.44–1.00)
GFR calc Af Amer: >60 mL/min (ref >60)
GFR calc non Af Amer: >60 mL/min (ref >60)
Glucose, Bld: 144 mg/dL — ABNORMAL HIGH (ref 70–99)
Potassium: 4.5 mmol/L (ref 3.5–5.1)
Sodium: 139 mmol/L (ref 135–145)

## 2019-01-24 LAB — MAGNESIUM: Magnesium: 2.6 mg/dL — ABNORMAL HIGH (ref 1.7–2.4)

## 2019-01-24 LAB — CBC
HCT: 44.4 % (ref 36.0–46.0)
Hemoglobin: 14.2 g/dL (ref 12.0–15.0)
MCH: 32.3 pg (ref 26.0–34.0)
MCHC: 32 g/dL (ref 30.0–36.0)
MCV: 101.1 fL — ABNORMAL HIGH (ref 80.0–100.0)
Platelets: 270 10*3/uL (ref 150–400)
RBC: 4.39 MIL/uL (ref 3.87–5.11)
RDW: 13.9 % (ref 11.5–15.5)
WBC: 16.9 10*3/uL — ABNORMAL HIGH (ref 4.0–10.5)
nRBC: 0 % (ref 0.0–0.2)

## 2019-01-24 MED ORDER — ARFORMOTEROL TARTRATE 15 MCG/2ML IN NEBU
15.0000 ug | INHALATION_SOLUTION | Freq: Two times a day (BID) | RESPIRATORY_TRACT | Status: DC
  Administered 2019-01-24 – 2019-01-30 (×12): 15 ug via RESPIRATORY_TRACT
  Filled 2019-01-24 (×12): qty 2

## 2019-01-24 NOTE — Progress Notes (Addendum)
Initial Nutrition Assessment  DOCUMENTATION CODES:   Morbid obesity  INTERVENTION:  ProStat 30 ml BID (each 30 ml provides 100 kcal, 15 gr protein)   Encourage: heart healthy diet at home  NUTRITION DIAGNOSIS:   Increased nutrient needs related to acute illness(S/p Hernia repair and COPD exacerbation) as evidenced by estimated needs.  GOAL:   Provide needs based on ASPEN/SCCM guidelines  MONITOR:   PO intake, Supplement acceptance, Labs, Weight trends, Skin  REASON FOR ASSESSMENT:   LOS   ASSESSMENT: Patient is a 58 yo who presented with N/V abdominal pain. Hx of COPD, HTN, hiatal hernia. Tobacco smoker. CT findings small bowel obstruction/ secondary to incisional hernia. NG tube was placed-decompression -remained until 9/16. Acute respiratory failure-COPD exacerbation. Underwent surgery -9/16-incisional hernia repair. Patient  progressed well through diet advancement. Tolerating Soft diet since 9/18. Upon RD visit today she is finishing up a plate of beef stew, mashed potatoes. Meal intake 75-100%.  Home diet is regular.  Weight history shows not significant changes. Patient eating as usual up to acute onset of abdominal pain day before presenting to ED.   Labs: BMP Latest Ref Rng & Units 01/24/2019 01/22/2019 01/18/2019  Glucose 70 - 99 mg/dL 144(H) 109(H) 97  BUN 6 - 20 mg/dL 26(H) 12 20  Creatinine 0.44 - 1.00 mg/dL 1.01(H) 0.92 0.92  Sodium 135 - 145 mmol/L 139 140 141  Potassium 3.5 - 5.1 mmol/L 4.5 3.4(L) 4.1  Chloride 98 - 111 mmol/L 105 104 103  CO2 22 - 32 mmol/L 26 28 30   Calcium 8.9 - 10.3 mg/dL 8.7(L) 8.3(L) 8.5(L)     NUTRITION - FOCUSED PHYSICAL EXAM:  Unable to complete Nutrition-Focused physical exam at this time.  Patient is eating lunch.   Diet Order:   Diet Order            DIET SOFT Room service appropriate? Yes; Fluid consistency: Thin  Diet effective now              EDUCATION NEEDS:   Education needs have been addressed   Skin:  Skin  Assessment: Skin Integrity Issues: Skin Integrity Issues:: Incisions Incisions: hernia repair and she has a surgical drain  Last BM:  9/24  Height:   Ht Readings from Last 1 Encounters:  01/15/19 5\' 5"  (1.651 m)    Weight:   Wt Readings from Last 1 Encounters:  01/24/19 132.5 kg    Ideal Body Weight:  57 kg  BMI:  Body mass index is 48.61 kg/m.  Estimated Nutritional Needs:   Kcal:  9826-4158 (11-14 kcal/kg/bw)  Protein:  125-130 gr  Fluid:  >1500 ml daily fluid   Colman Cater MS,RD,CSG,LDN Office: [phone] Pager: [phone]

## 2019-01-24 NOTE — Progress Notes (Signed)
9 Days Post-Op  Subjective: No abdominal complaints  Objective: Vital signs in last 24 hours: Temp:  [97.6 F (36.4 C)-98.6 F (37 C)] 97.6 F (36.4 C) (09/25 0758) Pulse Rate:  [48-109] 62 (09/25 0855) Resp:  [14-28] 19 (09/25 0855) BP: (124-162)/(63-89) 124/67 (09/25 0300) SpO2:  [81 %-94 %] 88 % (09/25 1116) FiO2 (%):  [60 %-75 %] 70 % (09/25 1116) Weight:  [132.5 kg] 132.5 kg (09/25 0500) Last BM Date: 01/23/19  Intake/Output from previous day: No intake/output data recorded. Intake/Output this shift: Total I/O In: 120 [P.O.:120] Out: 630 [Urine:600; Drains:30]  General appearance: alert, cooperative and no distress GI: Soft, incision healing well.  JP drainage serosanguineous.  Lab Results:  Recent Labs    01/22/19 0858 01/24/19 0417  WBC 11.4* 16.9*  HGB 14.2 14.2  HCT 45.0 44.4  PLT 216 270   BMET Recent Labs    01/22/19 0858 01/24/19 0417  NA 140 139  K 3.4* 4.5  CL 104 105  CO2 28 26  GLUCOSE 109* 144*  BUN 12 26*  CREATININE 0.92 1.01*  CALCIUM 8.3* 8.7*   PT/INR No results for input(s): LABPROT, INR in the last 72 hours.  Studies/Results: Dg Chest Port 1 View  Result Date: 01/24/2019 CLINICAL DATA:  Shortness of breath and pneumonia. EXAM: PORTABLE CHEST 1 VIEW COMPARISON:  01/22/2019 FINDINGS: Cardiac silhouette is stable. There are persistent bibasilar airspace opacities and possible small bilateral pleural effusions. No pneumothorax. No acute osseous abnormality. IMPRESSION: No significant interval change. Electronically Signed   By: Constance Holster M.D.   On: 01/24/2019 11:15    Anti-infectives: Anti-infectives (From admission, onward)   Start     Dose/Rate Route Frequency Ordered Stop   01/17/19 1400  clindamycin (CLEOCIN) IVPB 600 mg     600 mg 100 mL/hr over 30 Minutes Intravenous Every 8 hours 01/17/19 1314 01/20/19 0558   01/17/19 1200  clindamycin (CLEOCIN) IVPB 300 mg  Status:  Discontinued     300 mg 100 mL/hr over 30  Minutes Intravenous Every 6 hours 01/17/19 1159 01/17/19 1159   01/17/19 1200  clindamycin (CLEOCIN) IVPB 300 mg  Status:  Discontinued     300 mg 100 mL/hr over 30 Minutes Intravenous Every 6 hours 01/17/19 1159 01/17/19 1314   01/15/19 0900  vancomycin (VANCOCIN) 1,500 mg in sodium chloride 0.9 % 500 mL IVPB     1,500 mg 250 mL/hr over 120 Minutes Intravenous On call to O.R. 01/15/19 0636 01/15/19 1015   01/15/19 0600  vancomycin (VANCOCIN) 1,500 mg in sodium chloride 0.9 % 500 mL IVPB     1,500 mg 250 mL/hr over 120 Minutes Intravenous On call to O.R. 01/14/19 1941 01/15/19 0837      Assessment/Plan: s/p Procedure(s): INCISIONAL HERNIORRHAPHY WITH MESH OMENTECTOMY Impression: Stable from surgery standpoint.  Will follow peripherally with you.  Dr. Constance Haw will be available this weekend for any questions.  LOS: 10 days    [name] 01/24/2019

## 2019-01-24 NOTE — Progress Notes (Signed)
Subjective: She continues hypoxic.  She actually had to have her oxygen increased and she is at 75% now.  She looks comfortable.  She is not really got any symptoms.  She is still coughing a little bit.  She still has some wheezing.  Objective: Vital signs in last 24 hours: Temp:  [98.1 F (36.7 C)-98.6 F (37 C)] 98.5 F (36.9 C) (09/25 0400) Pulse Rate:  [48-109] 48 (09/25 0300) Resp:  [13-28] 18 (09/25 0300) BP: (100-162)/(43-89) 124/67 (09/25 0300) SpO2:  [82 %-94 %] 84 % (09/25 0423) FiO2 (%):  [60 %-70 %] 60 % (09/25 0423) Weight:  [132.5 kg] 132.5 kg (09/25 0500) Weight change: 0.2 kg Last BM Date: 01/23/19  Intake/Output from previous day: No intake/output data recorded.  PHYSICAL EXAM General appearance: alert, cooperative and morbidly obese Resp: rhonchi bilaterally Cardio: regular rate and rhythm, S1, S2 normal, no murmur, click, rub or gallop GI: soft, non-tender; bowel sounds normal; no masses,  no organomegaly Extremities: extremities normal, atraumatic, no cyanosis or edema  Lab Results:  Results for orders placed or performed during the hospital encounter of 01/13/19 (from the past 48 hour(s))  Basic metabolic panel     Status: Abnormal   Collection Time: 01/22/19  8:58 AM  Result Value Ref Range   Sodium 140 135 - 145 mmol/L   Potassium 3.4 (L) 3.5 - 5.1 mmol/L   Chloride 104 98 - 111 mmol/L   CO2 28 22 - 32 mmol/L   Glucose, Bld 109 (H) 70 - 99 mg/dL   BUN 12 6 - 20 mg/dL   Creatinine, Ser 0.92 0.44 - 1.00 mg/dL   Calcium 8.3 (L) 8.9 - 10.3 mg/dL   GFR calc non Af Amer >60 >60 mL/min   GFR calc Af Amer >60 >60 mL/min   Anion gap 8 5 - 15    Comment: Performed at [facility], [address], [place], [place] [address]  CBC     Status: Abnormal   Collection Time: 01/22/19  8:58 AM  Result Value Ref Range   WBC 11.4 (H) 4.0 - 10.5 K/uL   RBC 4.44 3.87 - 5.11 MIL/uL   Hemoglobin 14.2 12.0 - 15.0 g/dL   HCT 45.0 36.0 - 46.0 %   MCV 101.4 (H) 80.0 -  100.0 fL   MCH 32.0 26.0 - 34.0 pg   MCHC 31.6 30.0 - 36.0 g/dL   RDW 14.3 11.5 - 15.5 %   Platelets 216 150 - 400 K/uL   nRBC 0.0 0.0 - 0.2 %    Comment: Performed at [facility], [address] [place], [place] [address]  Brain natriuretic peptide     Status: None   Collection Time: 01/22/19  8:58 AM  Result Value Ref Range   B Natriuretic Peptide 68.0 0.0 - 100.0 pg/mL    Comment: Performed at [facility], [address], [place], [place] [address]  Procalcitonin - Baseline     Status: None   Collection Time: 01/23/19 10:20 AM  Result Value Ref Range   Procalcitonin <0.10 ng/mL    Comment:        Interpretation: PCT (Procalcitonin) <= 0.5 ng/mL: Systemic infection (sepsis) is not likely. Local bacterial infection is possible. (NOTE)       Sepsis PCT Algorithm           Lower Respiratory Tract  Infection PCT Algorithm    ----------------------------     ----------------------------         PCT < 0.25 ng/mL                PCT < 0.10 ng/mL         Strongly encourage             Strongly discourage   discontinuation of antibiotics    initiation of antibiotics    ----------------------------     -----------------------------       PCT 0.25 - 0.50 ng/mL            PCT 0.10 - 0.25 ng/mL               OR       >80% decrease in PCT            Discourage initiation of                                            antibiotics      Encourage discontinuation           of antibiotics    ----------------------------     -----------------------------         PCT >= 0.50 ng/mL              PCT 0.26 - 0.50 ng/mL               AND        <80% decrease in PCT             Encourage initiation of                                             antibiotics       Encourage continuation           of antibiotics    ----------------------------     -----------------------------        PCT >= 0.50 ng/mL                  PCT > 0.50 ng/mL               AND          increase in PCT                  Strongly encourage                                      initiation of antibiotics    Strongly encourage escalation           of antibiotics                                     -----------------------------                                           PCT <= 0.25 ng/mL  OR                                        > 80% decrease in PCT                                     Discontinue / Do not initiate                                             antibiotics Performed at [facility], [address], [place], [place] [address]   Basic metabolic panel     Status: Abnormal   Collection Time: 01/24/19  4:17 AM  Result Value Ref Range   Sodium 139 135 - 145 mmol/L   Potassium 4.5 3.5 - 5.1 mmol/L    Comment: DELTA CHECK NOTED   Chloride 105 98 - 111 mmol/L   CO2 26 22 - 32 mmol/L   Glucose, Bld 144 (H) 70 - 99 mg/dL   BUN 26 (H) 6 - 20 mg/dL   Creatinine, Ser 1.01 (H) 0.44 - 1.00 mg/dL   Calcium 8.7 (L) 8.9 - 10.3 mg/dL   GFR calc non Af Amer >60 >60 mL/min   GFR calc Af Amer >60 >60 mL/min   Anion gap 8 5 - 15    Comment: Performed at [facility], [address] [place], [place] [address]  CBC     Status: Abnormal   Collection Time: 01/24/19  4:17 AM  Result Value Ref Range   WBC 16.9 (H) 4.0 - 10.5 K/uL   RBC 4.39 3.87 - 5.11 MIL/uL   Hemoglobin 14.2 12.0 - 15.0 g/dL   HCT 44.4 36.0 - 46.0 %   MCV 101.1 (H) 80.0 - 100.0 fL   MCH 32.3 26.0 - 34.0 pg   MCHC 32.0 30.0 - 36.0 g/dL   RDW 13.9 11.5 - 15.5 %   Platelets 270 150 - 400 K/uL   nRBC 0.0 0.0 - 0.2 %    Comment: Performed at [facility], [address], [place], [place] [address]  Magnesium     Status: Abnormal   Collection Time: 01/24/19  4:17 AM  Result Value Ref Range   Magnesium 2.6 (H) 1.7 - 2.4 mg/dL    Comment: Performed at [facility], [address], [place], [place] [address]    ABGS No results for input(s): PHART, PO2ART, TCO2, HCO3 in  the last 72 hours.  Invalid input(s): PCO2 CULTURES Recent Results (from the past 240 hour(s))  Surgical pcr screen     Status: None   Collection Time: 01/14/19  7:41 PM   Specimen: Nasal Mucosa; Nasal Swab  Result Value Ref Range Status   MRSA, PCR NEGATIVE NEGATIVE Final   Staphylococcus aureus NEGATIVE NEGATIVE Final    Comment: (NOTE) The Xpert SA Assay (FDA approved for NASAL specimens in patients 57 years of age and older), is one component of a comprehensive surveillance program. It is not intended to diagnose infection nor to guide or monitor treatment. Performed at [facility], [address] [place], [place] [address]   MRSA PCR Screening     Status: None   Collection Time: 01/15/19  2:15 PM   Specimen: Nasal  Mucosa; Nasopharyngeal  Result Value Ref Range Status   MRSA by PCR NEGATIVE NEGATIVE Final    Comment:        The GeneXpert MRSA Assay (FDA approved for NASAL specimens only), is one component of a comprehensive MRSA colonization surveillance program. It is not intended to diagnose MRSA infection nor to guide or monitor treatment for MRSA infections. Performed at [facility], [address] [name], [place] [address]    Studies/Results: Dg Chest Port 1 View  Result Date: 01/22/2019 CLINICAL DATA:  Acute respiratory failure with hypoxia, history COPD, hypertension EXAM: PORTABLE CHEST 1 VIEW COMPARISON:  Portable exam 0941 hours compared to 01/20/2019 FINDINGS: Enlargement of cardiac silhouette. BILATERAL pulmonary infiltrates in the mid to lower lungs. Associated small RIGHT pleural effusion and basilar atelectasis. Lung apices clear. No pneumothorax. IMPRESSION: Bibasilar infiltrates slightly increased from previous exam with associated small RIGHT pleural effusion and mild RIGHT basilar atelectasis. Electronically Signed   By: Lavonia Dana M.D.   On: 01/22/2019 10:03    Medications:  Prior to Admission:  Medications Prior to Admission  Medication Sig  Dispense Refill Last Dose  . albuterol (PROVENTIL) (2.5 MG/3ML) 0.083% nebulizer solution Take 2.5 mg by nebulization every 6 (six) hours as needed for wheezing or shortness of breath.   unknown  . albuterol (VENTOLIN HFA) 108 (90 Base) MCG/ACT inhaler Inhale 1-2 puffs into the lungs every 6 (six) hours as needed for wheezing or shortness of breath.   unknown  . cyclobenzaprine (FLEXERIL) 10 MG tablet Take 10 mg by mouth daily as needed for muscle spasms.   Past Month at Unknown time  . diphenhydrAMINE HCl (ALLERGY MEDICATION PO) Take 1 tablet by mouth at bedtime.   01/12/2019 at Unknown time  . gabapentin (NEURONTIN) 300 MG capsule Take 600 mg by mouth at bedtime.    01/12/2019 at Unknown time  . ibuprofen (ADVIL) 200 MG tablet Take 400 mg by mouth at bedtime.   01/12/2019 at Unknown time  . lisinopril-hydrochlorothiazide (ZESTORETIC) 10-12.5 MG tablet Take 1 tablet by mouth every morning.   01/13/2019 at Unknown time   Scheduled: . acetylcysteine  1 mL Nebulization BID  . budesonide (PULMICORT) nebulizer solution  0.5 mg Nebulization BID  . Chlorhexidine Gluconate Cloth  6 each Topical Daily  . cyanocobalamin  1,000 mcg Intramuscular Daily  . enoxaparin (LOVENOX) injection  40 mg Subcutaneous Q24H  . folic acid  1 mg Oral Daily  . guaiFENesin  1,200 mg Oral BID  . ipratropium-albuterol  3 mL Nebulization Q4H  . lisinopril  20 mg Oral Daily  . magnesium hydroxide  30 mL Oral BID  . mouth rinse  15 mL Mouth Rinse BID  . methylPREDNISolone (SOLU-MEDROL) injection  40 mg Intravenous Q12H  . saccharomyces boulardii  250 mg Oral BID  . sodium chloride flush  3 mL Intravenous Q12H   Continuous: . sodium chloride     EHM:CNOBSJ chloride, acetaminophen **OR** acetaminophen, diphenhydrAMINE **OR** diphenhydrAMINE, diphenhydrAMINE-zinc acetate, fentaNYL (SUBLIMAZE) injection, guaiFENesin-dextromethorphan, ipratropium-albuterol, labetalol, LORazepam, ondansetron **OR** ondansetron (ZOFRAN) IV,  polyethylene glycol, polyvinyl alcohol, simethicone, sodium chloride flush, traMADol  Assesment: She was admitted with small bowel obstruction.  She eventually underwent surgery and had repair of incisional hernia and omentectomy with mesh.  She did well through surgery but she has been hypoxic.  She has severe acute hypoxic respiratory failure and I suspect that it is actually acute on chronic.  She is now requiring 75% oxygen to maintain O2 saturations in the high 80s.  She does have COPD at baseline and that is being treated.  She has aspiration pneumonia and that has been treated but chest x-ray done on the 23rd shows the infiltrates are somewhat increased.  We will repeat chest x-ray today Principal Problem:   Small bowel obstruction (HCC) Active Problems:   COPD (chronic obstructive pulmonary disease) (HCC)   Hypertension   Acute respiratory failure with hypoxia (HCC)   Polycythemia   COPD with acute exacerbation (HCC)   Incisional hernia, without obstruction or gangrene   Aspiration pneumonitis ([place])    Plan: Continue treatments.  It is not clear why her oxygen requirement is so high.  I do not think we have any evidence for any sort of intrapulmonary shunt.  She does have pneumonia and that is presumably the problem plus COPD exacerbation.  Will repeat chest x-ray today    LOS: 10 days   Veronica Horton 01/24/2019, 7:51 AM

## 2019-01-24 NOTE — Discharge Instructions (Signed)
Surgical [facility] Surgical drains are used to remove extra fluid that normally builds up in a surgical wound after surgery. A surgical drain helps to heal a surgical wound. Different kinds of surgical drains include:  Active drains. These drains use suction to pull drainage away from the surgical wound. Drainage flows through a tube to a container outside of the body. With these drains, you need to keep the bulb or the drainage container flat (compressed) at all times, except while you empty it. Flattening the bulb or container creates suction.  Passive drains. These drains allow fluid to drain naturally, by gravity. Drainage flows through a tube to a bandage (dressing) or a container outside of the body. Passive drains do not need to be emptied. A drain is placed during surgery. Right after surgery, drainage is usually bright red and a little thicker than water. The drainage may gradually turn yellow or pink and become thinner. It is likely that your health care provider will remove the drain when the drainage stops or when the amount decreases to 1-2 Tbsp (15-30 mL) during a 24-hour period. Supplies needed:  Tape.  Germ-free cleaning solution (sterile saline).  Cotton swabs.  Split gauze drain sponge: 4 x 4 inches (10 x 10 cm).  Gauze square: 4 x 4 inches (10 x 10 cm). How to care for your surgical drain Care for your drain as told by your health care provider. This is important to help prevent infection. If your drain is placed at your back, or any other hard-to-reach area, ask another person to assist you in performing the following tasks: General care  Keep the skin around the drain dry and covered with a dressing at all times.  Check your drain area every day for signs of infection. Check for: ? Redness, swelling, or pain. ? Pus or a bad smell. ? Cloudy drainage. ? Tenderness or pressure at the drain exit site. Changing the dressing Follow instructions from your health care  provider about how to change your dressing. Change your dressing at least once a day. Change it more often if needed to keep the dressing dry. Make sure you: 1. Gather your supplies. 2. Wash your hands with soap and water before you change your dressing. If soap and water are not available, use hand sanitizer. 3. Remove the old dressing. Avoid using scissors to do that. 4. Wash your hands with soap and water again after removing the old dressing. 5. Use sterile saline to clean your skin around the drain. You may need to use a cotton swab to clean the skin. 6. Place the tube through the slit in a drain sponge. Place the drain sponge so that it covers your wound. 7. Place the gauze square or another drain sponge on top of the drain sponge that is on the wound. Make sure the tube is between those layers. 8. Tape the dressing to your skin. 9. Tape the drainage tube to your skin 1-2 inches (2.5-5 cm) below the place where the tube enters your body. Taping keeps the tube from pulling on any stitches (sutures) that you have. 10. Wash your hands with soap and water. 11. Write down the color of your drainage and how often you change your dressing. How to empty your active drain  1. Make sure that you have a measuring cup that you can empty your drainage into. 2. Wash your hands with soap and water. If soap and water are not available, use hand sanitizer. 3.  Loosen any pins or clips that hold the tube in place. 4. If your health care provider tells you to strip the tube to prevent clots and tube blockages: ? Hold the tube at the skin with one hand. Use your other hand to pinch the tubing with your thumb and first finger. ? Gently move your fingers down the tube while squeezing very lightly. This clears any drainage, clots, or tissue from the tube. ? You may need to do this several times each day to keep the tube clear. Do not pull on the tube. 5. Open the bulb cap or the drain plug. Do not touch the  inside of the cap or the bottom of the plug. 6. Turn the device upside down and gently squeeze. 7. Empty all of the drainage into the measuring cup. 8. Compress the bulb or the container and replace the cap or the plug. To compress the bulb or the container, squeeze it firmly in the middle while you close the cap or plug the container. 9. Write down the amount of drainage that you have in each 24-hour period. If you have less than 2 Tbsp (30 mL) of drainage during 24 hours, contact your health care provider. 10. Flush the drainage down the toilet. 11. Wash your hands with soap and water. Contact a health care provider if:  You have redness, swelling, or pain around your drain area.  You have pus or a bad smell coming from your drain area.  You have a fever or chills.  The skin around your drain is warm to the touch.  The amount of drainage that you have is increasing instead of decreasing.  You have drainage that is cloudy.  There is a sudden stop or a sudden decrease in the amount of drainage that you have.  Your drain tube falls out.  Your active drain does not stay compressed after you empty it. Summary  Surgical drains are used to remove extra fluid that normally builds up in a surgical wound after surgery.  Different kinds of surgical drains include active drains and passive drains. Active drains use suction to pull drainage away from the surgical wound, and passive drains allow fluid to drain naturally.  It is important to care for your drain to prevent infection. If your drain is placed at your back, or any other hard-to-reach area, ask another person to assist you.  Contact your health care provider if you have redness, swelling, or pain around your drain area. This information is not intended to replace advice given to you by your health care provider. Make sure you discuss any questions you have with your health care provider. Document Released: 04/14/2000 Document  Revised: 05/22/2018 Document Reviewed: 05/22/2018 Elsevier Patient Education  2020 [employer].

## 2019-01-24 NOTE — Progress Notes (Signed)
PROGRESS NOTE  Veronica Horton:[id number] DOB: 1961/02/21 DOA: 01/13/2019 PCP: Sharilyn Sites, MD   Brief History: 58 y/o female with history of COPD, HTN, tobacco abuse, morbid obesity presenting with abdominal pain with nausea and vomiting that started evening 01/12/19 and progressively worsened throughout the day on 01/13/19. She also has had increasing cough with clear sputum x 1-2 days. She denies f/c, cp, hemoptysis, dysuria, hematuria, hematochezia, melena. She had some dyspnea on exertion, but stated it was not any worse than usual. Her last BM was on 01/12/19. She denies any new medications, but continues to smoke 1ppd.  In the ED, she was afebrile and hemodynamically stable. BMP, LFTs and CBC were essentially unremarkable except some polycythemia and WBC 11.3. The patient was afebrile hemodynamically stable but was hypoxic with oxygen saturation 87% on room air. CT of abd/pelvis showed SBO secondary to a large paraumbilical ventral hernia without strangulation. NG was placed for decompression. General surgery was consulted to assist.  Assessment/Plan: SBO -NG tube placed for decompressioninitially -01/15/19--incisional herniorrhaphy with mesh and omentectomy -having BMs and tolerating diet post-op -Optimize electrolytes  Acute respiratory failure with hypoxia -Secondary to COPD exacerbation with a component of aspiration pneumonitis -appreciate pulmonary consult -repeat PCT <0.10 -01/17/19 CTA chest neg for PE -repeat CXR--personally reviewed--bibasilar opacities unchanged -BNP--66 -Stable onHF NCpresently at 70% -Wean oxygen for saturation greater85-88% -add mucomyst -add brovana -suspect persistent hypoxia due to atelectasis and mucus plugging  COPD exacerbation -ContinuePulmicort -Continueduo nebs -reStarted IV Solu-Medrol 9/23 -added brovana and mucomyst  Essential hypertension -Restartedlisinopril -Hydralazine as  neededSBP>180  Peripheral neuropathy -Serum I32--549 -Folic IYME--1.5 -supplement B12 and folate  Hypokalemia -replete -check mag--2.6     Disposition Plan:remain in SDU due to high oxygen requirement requirement Family Communication:NoFamily at bedside  Consultants:General surgery, pulmonary  Code Status: FULL   DVT Prophylaxis: [place] Heparin    Procedures: As Listed in Progress Note Above  Antibiotics:    Subjective: Patient denies fevers, chills, headache, chest pain, dyspnea, nausea, vomiting, diarrhea, abdominal pain, dysuria, hematuria, hematochezia, and melena. Had BM in last 24 hours  Objective: Vitals:   01/24/19 1540 01/24/19 1555 01/24/19 1600 01/24/19 1649  BP:   135/74   Pulse: (!) 55  64   Resp:   (!) 22   Temp:    98.4 F (36.9 C)  TempSrc:    Oral  SpO2: (!) 86% (!) 85% (!) 83%   Weight:      Height:        Intake/Output Summary (Last 24 hours) at 01/24/2019 1702 Last data filed at 01/24/2019 1300 Gross per 24 hour  Intake 240 ml  Output 630 ml  Net -390 ml   Weight change: 0.2 kg Exam:   General:  Pt is alert, follows commands appropriately, not in acute distress  HEENT: No icterus, No thrush, No neck mass, [place]/AT  Cardiovascular: RRR, S1/S2, no rubs, no gallops  Respiratory: bibasilar rales, no wheeze  Abdomen: Soft/+BS, non tender, non distended, no guarding  Extremities: 1 + LE edema, No lymphangitis, No petechiae, No rashes, no synovitis   Data Reviewed: I have personally reviewed following labs and imaging studies Basic Metabolic Panel: Recent Labs  Lab 01/18/19 0501 01/22/19 0858 01/24/19 0417  NA 141 140 139  K 4.1 3.4* 4.5  CL 103 104 105  CO2 30 28 26   GLUCOSE 97 109* 144*  BUN 20 12 26*  CREATININE 0.92 0.92 1.01*  CALCIUM 8.5* 8.3*  8.7*  MG 2.4  --  2.6*   Liver Function Tests: Recent Labs  Lab 01/18/19 0501  AST 14*  ALT 16  ALKPHOS 52  BILITOT 1.0  PROT 6.1*  ALBUMIN  3.0*   No results for input(s): LIPASE, AMYLASE in the last 168 hours. No results for input(s): AMMONIA in the last 168 hours. Coagulation Profile: No results for input(s): INR, PROTIME in the last 168 hours. CBC: Recent Labs  Lab 01/18/19 0501 01/22/19 0858 01/24/19 0417  WBC 11.7* 11.4* 16.9*  NEUTROABS 9.2*  --   --   HGB 14.0 14.2 14.2  HCT 44.4 45.0 44.4  MCV 102.1* 101.4* 101.1*  PLT 184 216 270   Cardiac Enzymes: No results for input(s): CKTOTAL, CKMB, CKMBINDEX, TROPONINI in the last 168 hours. BNP: Invalid input(s): POCBNP CBG: No results for input(s): GLUCAP in the last 168 hours. HbA1C: No results for input(s): HGBA1C in the last 72 hours. Urine analysis:    Component Value Date/Time   COLORURINE YELLOW 01/13/2019 1730   APPEARANCEUR CLOUDY (A) 01/13/2019 1730   LABSPEC 1.017 01/13/2019 1730   PHURINE 8.0 01/13/2019 1730   GLUCOSEU NEGATIVE 01/13/2019 1730   HGBUR NEGATIVE 01/13/2019 1730   BILIRUBINUR NEGATIVE 01/13/2019 1730   KETONESUR NEGATIVE 01/13/2019 1730   PROTEINUR 100 (A) 01/13/2019 1730   UROBILINOGEN 0.2 09/25/2010 0530   NITRITE NEGATIVE 01/13/2019 1730   LEUKOCYTESUR NEGATIVE 01/13/2019 1730   Sepsis Labs: @LABRCNTIP (procalcitonin:4,lacticidven:4) ) Recent Results (from the past 240 hour(s))  Surgical pcr screen     Status: None   Collection Time: 01/14/19  7:41 PM   Specimen: Nasal Mucosa; Nasal Swab  Result Value Ref Range Status   MRSA, PCR NEGATIVE NEGATIVE Final   Staphylococcus aureus NEGATIVE NEGATIVE Final    Comment: (NOTE) The Xpert SA Assay (FDA approved for NASAL specimens in patients 33 years of age and older), is one component of a comprehensive surveillance program. It is not intended to diagnose infection nor to guide or monitor treatment. Performed at [facility], [address] [place], [place] [address]   MRSA PCR Screening     Status: None   Collection Time: 01/15/19  2:15 PM   Specimen: Nasal Mucosa;  Nasopharyngeal  Result Value Ref Range Status   MRSA by PCR NEGATIVE NEGATIVE Final    Comment:        The GeneXpert MRSA Assay (FDA approved for NASAL specimens only), is one component of a comprehensive MRSA colonization surveillance program. It is not intended to diagnose MRSA infection nor to guide or monitor treatment for MRSA infections. Performed at [facility], [address], [place], [place] [address]      Scheduled Meds: . acetylcysteine  1 mL Nebulization BID  . arformoterol  15 mcg Nebulization BID  . budesonide (PULMICORT) nebulizer solution  0.5 mg Nebulization BID  . Chlorhexidine Gluconate Cloth  6 each Topical Daily  . enoxaparin (LOVENOX) injection  40 mg Subcutaneous Q24H  . folic acid  1 mg Oral Daily  . guaiFENesin  1,200 mg Oral BID  . ipratropium-albuterol  3 mL Nebulization Q4H  . lisinopril  20 mg Oral Daily  . magnesium hydroxide  30 mL Oral BID  . mouth rinse  15 mL Mouth Rinse BID  . methylPREDNISolone (SOLU-MEDROL) injection  40 mg Intravenous Q12H  . saccharomyces boulardii  250 mg Oral BID  . sodium chloride flush  3 mL Intravenous Q12H   Continuous Infusions: . sodium chloride  Procedures/Studies: Ct Angio Chest Pe W Or Wo Contrast  Result Date: 01/17/2019 CLINICAL DATA:  Dyspnea chronically. Two days postop small-bowel obstruction and hernia repair. EXAM: CT ANGIOGRAPHY CHEST WITH CONTRAST TECHNIQUE: Multidetector CT imaging of the chest was performed using the standard protocol during bolus administration of intravenous contrast. Multiplanar CT image reconstructions and MIPs were obtained to evaluate the vascular anatomy. CONTRAST:  126mL OMNIPAQUE IOHEXOL 350 MG/ML SOLN COMPARISON:  Chest CT 10/03/2010 and abdominal CT 01/13/2019 FINDINGS: Cardiovascular: Mild cardiomegaly. Minimal calcified plaque over the left anterior descending coronary artery. Thoracic aorta is normal. Pulmonary arterial system is well opacified without evidence of  emboli. Aberrant right subclavian artery. Remaining vascular structures are unremarkable. Mediastinum/Nodes: No significant mediastinal or hilar adenopathy. Lungs/Pleura: Lungs are adequately inflated demonstrate moderate consolidation over the lower lobes bilaterally likely atelectasis. Mild atelectatic change over the right middle lobe and lingula. Subtle dependent atelectasis over the posterior right upper lobe. No significant effusion. Dependent debris over the distal trachea and right mainstem bronchus likely aspirate material. Obstructed right posterior lower lobe bronchus which may be due to mucous plugging versus aspiration. Narrowed right lower lobe bronchi. Upper Abdomen: No acute findings. Musculoskeletal: Degenerative change of the spine. Review of the MIP images confirms the above findings. IMPRESSION: 1.  No evidence of pulmonary embolism. 2. Significant bilateral lower lobe consolidation likely atelectasis. Mild atelectasis over the right middle lobe and lingula as well as minimal dependent atelectasis over the right upper lobe. No effusion. Aspirate material over the distal trachea and right mainstem bronchus. 3. Cardiomegaly with minimal atherosclerotic coronary artery disease. Electronically Signed   By: Marin Olp M.D.   On: 01/17/2019 10:12   Ct Abdomen Pelvis W Contrast  Result Date: 01/13/2019 CLINICAL DATA:  Right-sided abdominal pain and nausea and vomiting beginning last night. Prior cholecystectomy. EXAM: CT ABDOMEN AND PELVIS WITH CONTRAST TECHNIQUE: Multidetector CT imaging of the abdomen and pelvis was performed using the standard protocol following bolus administration of intravenous contrast. CONTRAST:  173mL OMNIPAQUE IOHEXOL 300 MG/ML  SOLN COMPARISON:  05/14/2012 FINDINGS: Lower Chest: Increased linear opacity in both lung bases since 2014, which may be due to subsegmental atelectasis or scarring. Hepatobiliary: No hepatic masses identified. Prior cholecystectomy. No evidence  of biliary obstruction. Pancreas:  No mass or inflammatory changes. Spleen: Within normal limits in size and appearance. Adrenals/Urinary Tract: No masses identified. Several small left renal cysts again noted. No evidence of hydronephrosis. Stomach/Bowel: Large paraumbilical ventral hernia is again seen containing multiple small bowel loops. Moderate dilatation of small bowel is seen within the hernia and proximal to the hernia, consistent with small-bowel obstruction. No evidence of small bowel wall thickening or pneumatosis. No evidence of inflammatory process or abnormal fluid collections. Diverticulosis is seen mainly involving the descending and sigmoid colon, however there is no evidence of diverticulitis. Vascular/Lymphatic: No pathologically enlarged lymph nodes. No abdominal aortic aneurysm. Aortic atherosclerosis. Reproductive:  No mass or other significant abnormality. Other:  None. Musculoskeletal:  No suspicious bone lesions identified. IMPRESSION: 1. Small bowel obstruction due to large paraumbilical ventral hernia, which contains multiple small bowel loops. No signs of bowel strangulation. 2. Colonic diverticulosis, without radiographic evidence of diverticulitis. Aortic Atherosclerosis (ICD10-I70.0). Electronically Signed   By: Marlaine Hind M.D.   On: 01/13/2019 18:50   Dg Chest Port 1 View  Result Date: 01/24/2019 CLINICAL DATA:  Shortness of breath and pneumonia. EXAM: PORTABLE CHEST 1 VIEW COMPARISON:  01/22/2019 FINDINGS: Cardiac silhouette is stable. There are persistent bibasilar airspace opacities and  possible small bilateral pleural effusions. No pneumothorax. No acute osseous abnormality. IMPRESSION: No significant interval change. Electronically Signed   By: Constance Holster M.D.   On: 01/24/2019 11:15   Dg Chest Port 1 View  Result Date: 01/22/2019 CLINICAL DATA:  Acute respiratory failure with hypoxia, history COPD, hypertension EXAM: PORTABLE CHEST 1 VIEW COMPARISON:  Portable  exam 0941 hours compared to 01/20/2019 FINDINGS: Enlargement of cardiac silhouette. BILATERAL pulmonary infiltrates in the mid to lower lungs. Associated small RIGHT pleural effusion and basilar atelectasis. Lung apices clear. No pneumothorax. IMPRESSION: Bibasilar infiltrates slightly increased from previous exam with associated small RIGHT pleural effusion and mild RIGHT basilar atelectasis. Electronically Signed   By: Lavonia Dana M.D.   On: 01/22/2019 10:03   Dg Chest Port 1 View  Result Date: 01/20/2019 CLINICAL DATA:  Hypoxia. EXAM: PORTABLE CHEST 1 VIEW COMPARISON:  Radiograph of January 17, 2019. FINDINGS: Stable cardiomegaly. No pneumothorax is noted. Stable bibasilar atelectasis or infiltrates are noted with possible small pleural effusions. Bony thorax is unremarkable. IMPRESSION: Stable bibasilar opacities and small effusions as described above Electronically Signed   By: Marijo Conception M.D.   On: 01/20/2019 15:01   Dg Chest Port 1 View  Result Date: 01/17/2019 CLINICAL DATA:  Acute respiratory failure. EXAM: PORTABLE CHEST 1 VIEW COMPARISON:  Radiograph of January 16, 2019. FINDINGS: Stable cardiomediastinal silhouette. No pneumothorax is noted. Stable bibasilar atelectasis or infiltrates are noted. Small bilateral pleural effusions are noted as well. Bony thorax unremarkable. IMPRESSION: Stable bibasilar opacities as described above. Electronically Signed   By: Marijo Conception M.D.   On: 01/17/2019 07:36   Dg Chest Port 1 View  Result Date: 01/16/2019 CLINICAL DATA:  Hypoxia today. EXAM: PORTABLE CHEST 1 VIEW COMPARISON:  Single-view of the chest 01/13/2019. PA and lateral chest 09/25/2010. CT chest 10/03/2010. FINDINGS: NG tube has been removed. There is cardiomegaly and interstitial edema. Right pleural effusion and basilar airspace disease have increased. Left basilar airspace disease is mildly improved. No pneumothorax. IMPRESSION: No marked change in cardiomegaly and interstitial  edema. Increased small right pleural effusion and basilar atelectasis. Left pleural effusion is slightly decreased. Electronically Signed   By: Inge Rise M.D.   On: 01/16/2019 09:34   Dg Chest Portable 1 View  Result Date: 01/13/2019 CLINICAL DATA:  NG tube placement EXAM: PORTABLE CHEST 1 VIEW COMPARISON:  Radiograph 01/13/2019 FINDINGS: Interval placement of a transesophageal tube with tip and side port distal to the GE junction, curling within the gastric lumen in the left upper quadrant. Redemonstration of the diffuse interstitial opacities and moderate cardiomegaly. No pneumothorax. No new consolidative process. No acute osseous or soft tissue abnormality. Degenerative changes are present in the imaged spine and shoulders. IMPRESSION: 1. Appropriate positioning of the transesophageal tube with the tip and side port distal to the GE junction 2. Slightly diminished lung volumes with persistent interstitial pulmonary edema and cardiomegaly. Electronically Signed   By: Lovena Le M.D.   On: 01/13/2019 20:15   Dg Chest Portable 1 View  Result Date: 01/13/2019 CLINICAL DATA:  Cough. Nausea and vomiting. Right-sided abdominal pain. EXAM: PORTABLE CHEST 1 VIEW COMPARISON:  09/25/2010 FINDINGS: Mild-to-moderate cardiomegaly. Diffuse interstitial infiltrates, suspicious for interstitial edema. Stable linear opacity in right lower lung, consistent with scarring. No evidence of pulmonary consolidation or pleural effusion. IMPRESSION: Cardiomegaly and diffuse interstitial infiltrates, suspicious for mild congestive heart failure. Electronically Signed   By: Marlaine Hind M.D.   On: 01/13/2019 16:41    Shanon Brow  Lorilee Cafarella, DO  [facility] Pager [phone]  If 7PM-7AM, please contact night-coverage [email or web address] Password [email or web address] 01/24/2019, 5:02 PM   LOS: 10 days

## 2019-01-25 LAB — CBC WITH DIFFERENTIAL/PLATELET
Abs Immature Granulocytes: 0.28 10*3/uL — ABNORMAL HIGH (ref 0.00–0.07)
Basophils Absolute: 0 10*3/uL (ref 0.0–0.1)
Basophils Relative: 0 %
Eosinophils Absolute: 0 10*3/uL (ref 0.0–0.5)
Eosinophils Relative: 0 %
HCT: 44.5 % (ref 36.0–46.0)
Hemoglobin: 14 g/dL (ref 12.0–15.0)
Immature Granulocytes: 2 %
Lymphocytes Relative: 5 %
Lymphs Abs: 0.9 10*3/uL (ref 0.7–4.0)
MCH: 31.9 pg (ref 26.0–34.0)
MCHC: 31.5 g/dL (ref 30.0–36.0)
MCV: 101.4 fL — ABNORMAL HIGH (ref 80.0–100.0)
Monocytes Absolute: 0.7 10*3/uL (ref 0.1–1.0)
Monocytes Relative: 4 %
Neutro Abs: 15.5 10*3/uL — ABNORMAL HIGH (ref 1.7–7.7)
Neutrophils Relative %: 89 %
Platelets: 288 10*3/uL (ref 150–400)
RBC: 4.39 MIL/uL (ref 3.87–5.11)
RDW: 14 % (ref 11.5–15.5)
WBC: 17.4 10*3/uL — ABNORMAL HIGH (ref 4.0–10.5)
nRBC: 0 % (ref 0.0–0.2)

## 2019-01-25 LAB — COMPREHENSIVE METABOLIC PANEL
ALT: 23 U/L (ref 0–44)
AST: 14 U/L — ABNORMAL LOW (ref 15–41)
Albumin: 3.2 g/dL — ABNORMAL LOW (ref 3.5–5.0)
Alkaline Phosphatase: 55 U/L (ref 38–126)
Anion gap: 7 (ref 5–15)
BUN: 33 mg/dL — ABNORMAL HIGH (ref 6–20)
CO2: 28 mmol/L (ref 22–32)
Calcium: 8.6 mg/dL — ABNORMAL LOW (ref 8.9–10.3)
Chloride: 103 mmol/L (ref 98–111)
Creatinine, Ser: 0.96 mg/dL (ref 0.44–1.00)
GFR calc Af Amer: >60 mL/min (ref >60)
GFR calc non Af Amer: >60 mL/min (ref >60)
Glucose, Bld: 140 mg/dL — ABNORMAL HIGH (ref 70–99)
Potassium: 4.2 mmol/L (ref 3.5–5.1)
Sodium: 138 mmol/L (ref 135–145)
Total Bilirubin: 0.6 mg/dL (ref 0.3–1.2)
Total Protein: 6.6 g/dL (ref 6.5–8.1)

## 2019-01-25 LAB — MAGNESIUM: Magnesium: 2.6 mg/dL — ABNORMAL HIGH (ref 1.7–2.4)

## 2019-01-25 MED ORDER — FUROSEMIDE 10 MG/ML IJ SOLN
20.0000 mg | Freq: Once | INTRAMUSCULAR | Status: AC
  Administered 2019-01-25: 20 mg via INTRAVENOUS
  Filled 2019-01-25: qty 2

## 2019-01-25 MED ORDER — VITAMIN B-12 1000 MCG PO TABS
500.0000 ug | ORAL_TABLET | Freq: Every day | ORAL | Status: DC
  Administered 2019-01-25 – 2019-01-30 (×6): 500 ug via ORAL
  Filled 2019-01-25 (×6): qty 1

## 2019-01-25 NOTE — Progress Notes (Signed)
Subjective: She remains with hypoxia requiring high flow oxygen.  She is on 70%.  She says she is not coughing much now.  Chest x-ray that I ordered yesterday and have personally reviewed really shows no significant change.  She was admitted after having had small bowel obstruction from a incisional hernia and has had hernia repair and omentectomy.  She has remained hypoxic postop.  I suspect that she is hypoxic at home as well.  Objective: Vital signs in last 24 hours: Temp:  [97.7 F (36.5 C)-98.4 F (36.9 C)] 97.9 F (36.6 C) (09/26 0738) Pulse Rate:  [44-73] 54 (09/26 0738) Resp:  [15-24] 15 (09/26 0738) BP: (113-172)/(60-96) 113/61 (09/26 0600) SpO2:  [83 %-90 %] 88 % (09/26 0800) FiO2 (%):  [70 %] 70 % (09/26 0800) Weight change:  Last BM Date: 01/24/19  Intake/Output from previous day: 09/25 0701 - 09/26 0700 In: 720 [P.O.:720] Out: 1030 [Urine:1000; Drains:30]  PHYSICAL EXAM General appearance: alert, cooperative and morbidly obese Resp: Her chest is clearer than yesterday Cardio: regular rate and rhythm, S1, S2 normal, no murmur, click, rub or gallop GI: soft, non-tender; bowel sounds normal; no masses,  no organomegaly Extremities: extremities normal, atraumatic, no cyanosis or edema  Lab Results:  Results for orders placed or performed during the hospital encounter of 01/13/19 (from the past 48 hour(s))  Procalcitonin - Baseline     Status: None   Collection Time: 01/23/19 10:20 AM  Result Value Ref Range   Procalcitonin <0.10 ng/mL    Comment:        Interpretation: PCT (Procalcitonin) <= 0.5 ng/mL: Systemic infection (sepsis) is not likely. Local bacterial infection is possible. (NOTE)       Sepsis PCT Algorithm           Lower Respiratory Tract                                      Infection PCT Algorithm    ----------------------------     ----------------------------         PCT < 0.25 ng/mL                PCT < 0.10 ng/mL         Strongly encourage              Strongly discourage   discontinuation of antibiotics    initiation of antibiotics    ----------------------------     -----------------------------       PCT 0.25 - 0.50 ng/mL            PCT 0.10 - 0.25 ng/mL               OR       >80% decrease in PCT            Discourage initiation of                                            antibiotics      Encourage discontinuation           of antibiotics    ----------------------------     -----------------------------         PCT >= 0.50 ng/mL              PCT 0.26 -  0.50 ng/mL               AND        <80% decrease in PCT             Encourage initiation of                                             antibiotics       Encourage continuation           of antibiotics    ----------------------------     -----------------------------        PCT >= 0.50 ng/mL                  PCT > 0.50 ng/mL               AND         increase in PCT                  Strongly encourage                                      initiation of antibiotics    Strongly encourage escalation           of antibiotics                                     -----------------------------                                           PCT <= 0.25 ng/mL                                                 OR                                        > 80% decrease in PCT                                     Discontinue / Do not initiate                                             antibiotics Performed at [facility], [address], [place], [place] [address]   Basic metabolic panel     Status: Abnormal   Collection Time: 01/24/19  4:17 AM  Result Value Ref Range   Sodium 139 135 - 145 mmol/L   Potassium 4.5 3.5 - 5.1 mmol/L    Comment: DELTA CHECK NOTED   Chloride 105 98 - 111 mmol/L   CO2 26 22 - 32 mmol/L   Glucose, Bld 144 (H) 70 - 99 mg/dL  BUN 26 (H) 6 - 20 mg/dL   Creatinine, Ser 1.01 (H) 0.44 - 1.00 mg/dL   Calcium 8.7 (L) 8.9 - 10.3 mg/dL   GFR calc non Af Amer >60 >60  mL/min   GFR calc Af Amer >60 >60 mL/min   Anion gap 8 5 - 15    Comment: Performed at [facility], [address] [place], [place] [address]  CBC     Status: Abnormal   Collection Time: 01/24/19  4:17 AM  Result Value Ref Range   WBC 16.9 (H) 4.0 - 10.5 K/uL   RBC 4.39 3.87 - 5.11 MIL/uL   Hemoglobin 14.2 12.0 - 15.0 g/dL   HCT 44.4 36.0 - 46.0 %   MCV 101.1 (H) 80.0 - 100.0 fL   MCH 32.3 26.0 - 34.0 pg   MCHC 32.0 30.0 - 36.0 g/dL   RDW 13.9 11.5 - 15.5 %   Platelets 270 150 - 400 K/uL   nRBC 0.0 0.0 - 0.2 %    Comment: Performed at [facility], [address] [place], [place] [address]  Magnesium     Status: Abnormal   Collection Time: 01/24/19  4:17 AM  Result Value Ref Range   Magnesium 2.6 (H) 1.7 - 2.4 mg/dL    Comment: Performed at [facility], [address], [place], [place] [address]  Comprehensive metabolic panel     Status: Abnormal   Collection Time: 01/25/19  4:26 AM  Result Value Ref Range   Sodium 138 135 - 145 mmol/L   Potassium 4.2 3.5 - 5.1 mmol/L   Chloride 103 98 - 111 mmol/L   CO2 28 22 - 32 mmol/L   Glucose, Bld 140 (H) 70 - 99 mg/dL   BUN 33 (H) 6 - 20 mg/dL   Creatinine, Ser 0.96 0.44 - 1.00 mg/dL   Calcium 8.6 (L) 8.9 - 10.3 mg/dL   Total Protein 6.6 6.5 - 8.1 g/dL   Albumin 3.2 (L) 3.5 - 5.0 g/dL   AST 14 (L) 15 - 41 U/L   ALT 23 0 - 44 U/L   Alkaline Phosphatase 55 38 - 126 U/L   Total Bilirubin 0.6 0.3 - 1.2 mg/dL   GFR calc non Af Amer >60 >60 mL/min   GFR calc Af Amer >60 >60 mL/min   Anion gap 7 5 - 15    Comment: Performed at [facility], [address], [place], [place] [address]  Magnesium     Status: Abnormal   Collection Time: 01/25/19  4:26 AM  Result Value Ref Range   Magnesium 2.6 (H) 1.7 - 2.4 mg/dL    Comment: Performed at [facility], [address] [place], [place] [address]  CBC with Differential/Platelet     Status: Abnormal   Collection Time: 01/25/19  4:26 AM  Result Value Ref Range   WBC 17.4 (H) 4.0 - 10.5 K/uL    RBC 4.39 3.87 - 5.11 MIL/uL   Hemoglobin 14.0 12.0 - 15.0 g/dL   HCT 44.5 36.0 - 46.0 %   MCV 101.4 (H) 80.0 - 100.0 fL   MCH 31.9 26.0 - 34.0 pg   MCHC 31.5 30.0 - 36.0 g/dL   RDW 14.0 11.5 - 15.5 %   Platelets 288 150 - 400 K/uL   nRBC 0.0 0.0 - 0.2 %   Neutrophils Relative % 89 %   Neutro Abs 15.5 (H) 1.7 - 7.7 K/uL   Lymphocytes Relative 5 %   Lymphs Abs 0.9 0.7 -  4.0 K/uL   Monocytes Relative 4 %   Monocytes Absolute 0.7 0.1 - 1.0 K/uL   Eosinophils Relative 0 %   Eosinophils Absolute 0.0 0.0 - 0.5 K/uL   Basophils Relative 0 %   Basophils Absolute 0.0 0.0 - 0.1 K/uL   Immature Granulocytes 2 %   Abs Immature Granulocytes 0.28 (H) 0.00 - 0.07 K/uL    Comment: Performed at [facility], [address], [place], [place] [address]    ABGS No results for input(s): PHART, PO2ART, TCO2, HCO3 in the last 72 hours.  Invalid input(s): PCO2 CULTURES Recent Results (from the past 240 hour(s))  MRSA PCR Screening     Status: None   Collection Time: 01/15/19  2:15 PM   Specimen: Nasal Mucosa; Nasopharyngeal  Result Value Ref Range Status   MRSA by PCR NEGATIVE NEGATIVE Final    Comment:        The GeneXpert MRSA Assay (FDA approved for NASAL specimens only), is one component of a comprehensive MRSA colonization surveillance program. It is not intended to diagnose MRSA infection nor to guide or monitor treatment for MRSA infections. Performed at [facility], [address] [place], [place] [address]    Studies/Results: Dg Chest Port 1 View  Result Date: 01/24/2019 CLINICAL DATA:  Shortness of breath and pneumonia. EXAM: PORTABLE CHEST 1 VIEW COMPARISON:  01/22/2019 FINDINGS: Cardiac silhouette is stable. There are persistent bibasilar airspace opacities and possible small bilateral pleural effusions. No pneumothorax. No acute osseous abnormality. IMPRESSION: No significant interval change. Electronically Signed   By: Constance Holster M.D.   On: 01/24/2019 11:15     Medications:  Prior to Admission:  Medications Prior to Admission  Medication Sig Dispense Refill Last Dose  . albuterol (PROVENTIL) (2.5 MG/3ML) 0.083% nebulizer solution Take 2.5 mg by nebulization every 6 (six) hours as needed for wheezing or shortness of breath.   unknown  . albuterol (VENTOLIN HFA) 108 (90 Base) MCG/ACT inhaler Inhale 1-2 puffs into the lungs every 6 (six) hours as needed for wheezing or shortness of breath.   unknown  . cyclobenzaprine (FLEXERIL) 10 MG tablet Take 10 mg by mouth daily as needed for muscle spasms.   Past Month at Unknown time  . diphenhydrAMINE HCl (ALLERGY MEDICATION PO) Take 1 tablet by mouth at bedtime.   01/12/2019 at Unknown time  . gabapentin (NEURONTIN) 300 MG capsule Take 600 mg by mouth at bedtime.    01/12/2019 at Unknown time  . ibuprofen (ADVIL) 200 MG tablet Take 400 mg by mouth at bedtime.   01/12/2019 at Unknown time  . lisinopril-hydrochlorothiazide (ZESTORETIC) 10-12.5 MG tablet Take 1 tablet by mouth every morning.   01/13/2019 at Unknown time   Scheduled: . acetylcysteine  1 mL Nebulization BID  . arformoterol  15 mcg Nebulization BID  . budesonide (PULMICORT) nebulizer solution  0.5 mg Nebulization BID  . Chlorhexidine Gluconate Cloth  6 each Topical Daily  . enoxaparin (LOVENOX) injection  40 mg Subcutaneous Q24H  . folic acid  1 mg Oral Daily  . guaiFENesin  1,200 mg Oral BID  . ipratropium-albuterol  3 mL Nebulization Q4H  . lisinopril  20 mg Oral Daily  . magnesium hydroxide  30 mL Oral BID  . mouth rinse  15 mL Mouth Rinse BID  . methylPREDNISolone (SOLU-MEDROL) injection  40 mg Intravenous Q12H  . saccharomyces boulardii  250 mg Oral BID  . sodium chloride flush  3 mL Intravenous Q12H   Continuous: . sodium chloride  [id number] chloride, acetaminophen **OR** acetaminophen, diphenhydrAMINE **OR** diphenhydrAMINE, diphenhydrAMINE-zinc acetate, fentaNYL (SUBLIMAZE) injection, guaiFENesin-dextromethorphan,  ipratropium-albuterol, labetalol, LORazepam, ondansetron **OR** ondansetron (ZOFRAN) IV, polyethylene glycol, polyvinyl alcohol, simethicone, sodium chloride flush, traMADol  Assesment: She was admitted with small bowel obstruction and eventually underwent hernia repair and omentectomy with mesh.  She is done well postop  During her period of nausea and vomiting prior to admission she aspirated and has been treated for aspiration pneumonitis.  She still has infiltrates on chest x-ray  At baseline she has COPD and she is having acute exacerbation which is being treated with nebulizer treatments and steroids  She was polycythemic on admission which may be some hemoconcentration but also would make me think that she probably has hypoxia at home. Principal Problem:   Small bowel obstruction (HCC) Active Problems:   COPD (chronic obstructive pulmonary disease) (HCC)   Hypertension   Acute respiratory failure with hypoxia (HCC)   Polycythemia   COPD with acute exacerbation (HCC)   Incisional hernia, without obstruction or gangrene   Aspiration pneumonitis ([place])    Plan: Continue treatments.  Continue attempts to wean oxygen.    LOS: 11 days   Alonza Bogus 01/25/2019, 8:25 AM

## 2019-01-25 NOTE — Progress Notes (Signed)
PROGRESS NOTE  Veronica Horton KVQ:[id number] DOB: 1961-01-19 DOA: 01/13/2019 PCP: Sharilyn Sites, MD   Brief History: 58 y/o female with history of COPD, HTN, tobacco abuse, morbid obesity presenting with abdominal pain with nausea and vomiting that started evening 01/12/19 and progressively worsened throughout the day on 01/13/19. She also has had increasing cough with clear sputum x 1-2 days. She denies f/c, cp, hemoptysis, dysuria, hematuria, hematochezia, melena. She had some dyspnea on exertion, but stated it was not any worse than usual. Her last BM was on 01/12/19. She denies any new medications, but continues to smoke 1ppd.  In the ED, she was afebrile and hemodynamically stable. BMP, LFTs and CBC were essentially unremarkable except some polycythemia and WBC 11.3. The patient was afebrile hemodynamically stable but was hypoxic with oxygen saturation 87% on room air. CT of abd/pelvis showed SBO secondary to a large paraumbilical ventral hernia without strangulation. NG was placed for decompression. General surgery was consulted to assist. Postop, the patient had persistent hypoxemia.  CTA chest was negative for PE.  Pulmonary medicine was consulted to assist with management.   Assessment/Plan: SBO -NG tube placed for decompressioninitially -01/15/19--incisional herniorrhaphy with mesh and omentectomy -having BMs and tolerating diet post-op -Optimize electrolytes  Acute respiratory failure with hypoxia -Secondary to COPD exacerbation with a component of aspiration pneumonitis -appreciate pulmonary consult -repeat PCT <0.10 -01/17/19 CTA chest neg for PE -repeat CXR--personally reviewed--bibasilar opacities unchanged -BNP--66 -Stable onHF NCpresentlyat 70% -Wean oxygen for saturation greater85-88% -added mucomyst -added brovana -suspect persistent hypoxia due to atelectasis and mucus plugging -give lasix IV x 1  COPD  exacerbation -ContinuePulmicort -Continueduo nebs -reStartedIV Solu-Medrol 9/23 -added brovana and mucomyst  Essential hypertension -Restartedlisinopril -Hydralazine as neededSBP>180  Peripheral neuropathy -Serum I43--329 -Folic JJOA--4.1 -supplement B12 and folate  Hypokalemia -replete -check mag--2.6     Disposition Plan:remain in SDU due to high oxygenrequirementrequirement Family Communication:NoFamily at bedside  Consultants:General surgery, pulmonary  Code Status: FULL   DVT Prophylaxis: [place] Lovenox    Procedures: As Listed in Progress Note Above  Antibiotics:      Subjective: Patient denies fevers, chills, headache, chest pain, dyspnea, nausea, vomiting, diarrhea, abdominal pain, dysuria, hematuria, hematochezia, and melena. She states she is not coughing up much, but feels like she needs to bring some sputum up  Objective: Vitals:   01/25/19 1200 01/25/19 1230 01/25/19 1600 01/25/19 1608  BP:      Pulse: 65     Resp: (!) 26     Temp:   98.3 F (36.8 C)   TempSrc:   Oral   SpO2: (!) 83% (!) 85%  (!) 85%  Weight:      Height:        Intake/Output Summary (Last 24 hours) at 01/25/2019 1654 Last data filed at 01/25/2019 0427 Gross per 24 hour  Intake 480 ml  Output 400 ml  Net 80 ml   Weight change:  Exam:   General:  Pt is alert, follows commands appropriately, not in acute distress  HEENT: No icterus, No thrush, No neck mass, [place]/AT  Cardiovascular: RRR, S1/S2, no rubs, no gallops  Respiratory: bibasilar rales. No wheeze  Abdomen: Soft/+BS, non tender, non distended, no guarding  Extremities: trace LE edema, No lymphangitis, No petechiae, No rashes, no synovitis   Data Reviewed: I have personally reviewed following labs and imaging studies Basic Metabolic Panel: Recent Labs  Lab 01/22/19 0858 01/24/19 0417 01/25/19 0426  NA 140 139  138  K 3.4* 4.5 4.2  CL 104 105 103  CO2 28 26 28    GLUCOSE 109* 144* 140*  BUN 12 26* 33*  CREATININE 0.92 1.01* 0.96  CALCIUM 8.3* 8.7* 8.6*  MG  --  2.6* 2.6*   Liver Function Tests: Recent Labs  Lab 01/25/19 0426  AST 14*  ALT 23  ALKPHOS 55  BILITOT 0.6  PROT 6.6  ALBUMIN 3.2*   No results for input(s): LIPASE, AMYLASE in the last 168 hours. No results for input(s): AMMONIA in the last 168 hours. Coagulation Profile: No results for input(s): INR, PROTIME in the last 168 hours. CBC: Recent Labs  Lab 01/22/19 0858 01/24/19 0417 01/25/19 0426  WBC 11.4* 16.9* 17.4*  NEUTROABS  --   --  15.5*  HGB 14.2 14.2 14.0  HCT 45.0 44.4 44.5  MCV 101.4* 101.1* 101.4*  PLT 216 270 288   Cardiac Enzymes: No results for input(s): CKTOTAL, CKMB, CKMBINDEX, TROPONINI in the last 168 hours. BNP: Invalid input(s): POCBNP CBG: No results for input(s): GLUCAP in the last 168 hours. HbA1C: No results for input(s): HGBA1C in the last 72 hours. Urine analysis:    Component Value Date/Time   COLORURINE YELLOW 01/13/2019 1730   APPEARANCEUR CLOUDY (A) 01/13/2019 1730   LABSPEC 1.017 01/13/2019 1730   PHURINE 8.0 01/13/2019 1730   GLUCOSEU NEGATIVE 01/13/2019 1730   HGBUR NEGATIVE 01/13/2019 1730   BILIRUBINUR NEGATIVE 01/13/2019 1730   KETONESUR NEGATIVE 01/13/2019 1730   PROTEINUR 100 (A) 01/13/2019 1730   UROBILINOGEN 0.2 09/25/2010 0530   NITRITE NEGATIVE 01/13/2019 1730   LEUKOCYTESUR NEGATIVE 01/13/2019 1730   Sepsis Labs: @LABRCNTIP (procalcitonin:4,lacticidven:4) )No results found for this or any previous visit (from the past 240 hour(s)).   Scheduled Meds: . acetylcysteine  1 mL Nebulization BID  . arformoterol  15 mcg Nebulization BID  . budesonide (PULMICORT) nebulizer solution  0.5 mg Nebulization BID  . Chlorhexidine Gluconate Cloth  6 each Topical Daily  . enoxaparin (LOVENOX) injection  40 mg Subcutaneous Q24H  . folic acid  1 mg Oral Daily  . guaiFENesin  1,200 mg Oral BID  . ipratropium-albuterol  3 mL  Nebulization Q4H  . lisinopril  20 mg Oral Daily  . magnesium hydroxide  30 mL Oral BID  . mouth rinse  15 mL Mouth Rinse BID  . methylPREDNISolone (SOLU-MEDROL) injection  40 mg Intravenous Q12H  . saccharomyces boulardii  250 mg Oral BID  . sodium chloride flush  3 mL Intravenous Q12H   Continuous Infusions: . sodium chloride      Procedures/Studies: Ct Angio Chest Pe W Or Wo Contrast  Result Date: 01/17/2019 CLINICAL DATA:  Dyspnea chronically. Two days postop small-bowel obstruction and hernia repair. EXAM: CT ANGIOGRAPHY CHEST WITH CONTRAST TECHNIQUE: Multidetector CT imaging of the chest was performed using the standard protocol during bolus administration of intravenous contrast. Multiplanar CT image reconstructions and MIPs were obtained to evaluate the vascular anatomy. CONTRAST:  130mL OMNIPAQUE IOHEXOL 350 MG/ML SOLN COMPARISON:  Chest CT 10/03/2010 and abdominal CT 01/13/2019 FINDINGS: Cardiovascular: Mild cardiomegaly. Minimal calcified plaque over the left anterior descending coronary artery. Thoracic aorta is normal. Pulmonary arterial system is well opacified without evidence of emboli. Aberrant right subclavian artery. Remaining vascular structures are unremarkable. Mediastinum/Nodes: No significant mediastinal or hilar adenopathy. Lungs/Pleura: Lungs are adequately inflated demonstrate moderate consolidation over the lower lobes bilaterally likely atelectasis. Mild atelectatic change over the right middle lobe and lingula. Subtle dependent atelectasis over the posterior right upper  lobe. No significant effusion. Dependent debris over the distal trachea and right mainstem bronchus likely aspirate material. Obstructed right posterior lower lobe bronchus which may be due to mucous plugging versus aspiration. Narrowed right lower lobe bronchi. Upper Abdomen: No acute findings. Musculoskeletal: Degenerative change of the spine. Review of the MIP images confirms the above findings.  IMPRESSION: 1.  No evidence of pulmonary embolism. 2. Significant bilateral lower lobe consolidation likely atelectasis. Mild atelectasis over the right middle lobe and lingula as well as minimal dependent atelectasis over the right upper lobe. No effusion. Aspirate material over the distal trachea and right mainstem bronchus. 3. Cardiomegaly with minimal atherosclerotic coronary artery disease. Electronically Signed   By: Marin Olp M.D.   On: 01/17/2019 10:12   Ct Abdomen Pelvis W Contrast  Result Date: 01/13/2019 CLINICAL DATA:  Right-sided abdominal pain and nausea and vomiting beginning last night. Prior cholecystectomy. EXAM: CT ABDOMEN AND PELVIS WITH CONTRAST TECHNIQUE: Multidetector CT imaging of the abdomen and pelvis was performed using the standard protocol following bolus administration of intravenous contrast. CONTRAST:  136mL OMNIPAQUE IOHEXOL 300 MG/ML  SOLN COMPARISON:  05/14/2012 FINDINGS: Lower Chest: Increased linear opacity in both lung bases since 2014, which may be due to subsegmental atelectasis or scarring. Hepatobiliary: No hepatic masses identified. Prior cholecystectomy. No evidence of biliary obstruction. Pancreas:  No mass or inflammatory changes. Spleen: Within normal limits in size and appearance. Adrenals/Urinary Tract: No masses identified. Several small left renal cysts again noted. No evidence of hydronephrosis. Stomach/Bowel: Large paraumbilical ventral hernia is again seen containing multiple small bowel loops. Moderate dilatation of small bowel is seen within the hernia and proximal to the hernia, consistent with small-bowel obstruction. No evidence of small bowel wall thickening or pneumatosis. No evidence of inflammatory process or abnormal fluid collections. Diverticulosis is seen mainly involving the descending and sigmoid colon, however there is no evidence of diverticulitis. Vascular/Lymphatic: No pathologically enlarged lymph nodes. No abdominal aortic aneurysm.  Aortic atherosclerosis. Reproductive:  No mass or other significant abnormality. Other:  None. Musculoskeletal:  No suspicious bone lesions identified. IMPRESSION: 1. Small bowel obstruction due to large paraumbilical ventral hernia, which contains multiple small bowel loops. No signs of bowel strangulation. 2. Colonic diverticulosis, without radiographic evidence of diverticulitis. Aortic Atherosclerosis (ICD10-I70.0). Electronically Signed   By: Marlaine Hind M.D.   On: 01/13/2019 18:50   Dg Chest Port 1 View  Result Date: 01/24/2019 CLINICAL DATA:  Shortness of breath and pneumonia. EXAM: PORTABLE CHEST 1 VIEW COMPARISON:  01/22/2019 FINDINGS: Cardiac silhouette is stable. There are persistent bibasilar airspace opacities and possible small bilateral pleural effusions. No pneumothorax. No acute osseous abnormality. IMPRESSION: No significant interval change. Electronically Signed   By: Constance Holster M.D.   On: 01/24/2019 11:15   Dg Chest Port 1 View  Result Date: 01/22/2019 CLINICAL DATA:  Acute respiratory failure with hypoxia, history COPD, hypertension EXAM: PORTABLE CHEST 1 VIEW COMPARISON:  Portable exam 0941 hours compared to 01/20/2019 FINDINGS: Enlargement of cardiac silhouette. BILATERAL pulmonary infiltrates in the mid to lower lungs. Associated small RIGHT pleural effusion and basilar atelectasis. Lung apices clear. No pneumothorax. IMPRESSION: Bibasilar infiltrates slightly increased from previous exam with associated small RIGHT pleural effusion and mild RIGHT basilar atelectasis. Electronically Signed   By: Lavonia Dana M.D.   On: 01/22/2019 10:03   Dg Chest Port 1 View  Result Date: 01/20/2019 CLINICAL DATA:  Hypoxia. EXAM: PORTABLE CHEST 1 VIEW COMPARISON:  Radiograph of January 17, 2019. FINDINGS: Stable cardiomegaly. No pneumothorax  is noted. Stable bibasilar atelectasis or infiltrates are noted with possible small pleural effusions. Bony thorax is unremarkable. IMPRESSION:  Stable bibasilar opacities and small effusions as described above Electronically Signed   By: Marijo Conception M.D.   On: 01/20/2019 15:01   Dg Chest Port 1 View  Result Date: 01/17/2019 CLINICAL DATA:  Acute respiratory failure. EXAM: PORTABLE CHEST 1 VIEW COMPARISON:  Radiograph of January 16, 2019. FINDINGS: Stable cardiomediastinal silhouette. No pneumothorax is noted. Stable bibasilar atelectasis or infiltrates are noted. Small bilateral pleural effusions are noted as well. Bony thorax unremarkable. IMPRESSION: Stable bibasilar opacities as described above. Electronically Signed   By: Marijo Conception M.D.   On: 01/17/2019 07:36   Dg Chest Port 1 View  Result Date: 01/16/2019 CLINICAL DATA:  Hypoxia today. EXAM: PORTABLE CHEST 1 VIEW COMPARISON:  Single-view of the chest 01/13/2019. PA and lateral chest 09/25/2010. CT chest 10/03/2010. FINDINGS: NG tube has been removed. There is cardiomegaly and interstitial edema. Right pleural effusion and basilar airspace disease have increased. Left basilar airspace disease is mildly improved. No pneumothorax. IMPRESSION: No marked change in cardiomegaly and interstitial edema. Increased small right pleural effusion and basilar atelectasis. Left pleural effusion is slightly decreased. Electronically Signed   By: Inge Rise M.D.   On: 01/16/2019 09:34   Dg Chest Portable 1 View  Result Date: 01/13/2019 CLINICAL DATA:  NG tube placement EXAM: PORTABLE CHEST 1 VIEW COMPARISON:  Radiograph 01/13/2019 FINDINGS: Interval placement of a transesophageal tube with tip and side port distal to the GE junction, curling within the gastric lumen in the left upper quadrant. Redemonstration of the diffuse interstitial opacities and moderate cardiomegaly. No pneumothorax. No new consolidative process. No acute osseous or soft tissue abnormality. Degenerative changes are present in the imaged spine and shoulders. IMPRESSION: 1. Appropriate positioning of the  transesophageal tube with the tip and side port distal to the GE junction 2. Slightly diminished lung volumes with persistent interstitial pulmonary edema and cardiomegaly. Electronically Signed   By: Lovena Le M.D.   On: 01/13/2019 20:15   Dg Chest Portable 1 View  Result Date: 01/13/2019 CLINICAL DATA:  Cough. Nausea and vomiting. Right-sided abdominal pain. EXAM: PORTABLE CHEST 1 VIEW COMPARISON:  09/25/2010 FINDINGS: Mild-to-moderate cardiomegaly. Diffuse interstitial infiltrates, suspicious for interstitial edema. Stable linear opacity in right lower lung, consistent with scarring. No evidence of pulmonary consolidation or pleural effusion. IMPRESSION: Cardiomegaly and diffuse interstitial infiltrates, suspicious for mild congestive heart failure. Electronically Signed   By: Marlaine Hind M.D.   On: 01/13/2019 16:41    Orson Eva, DO  [facility] Pager [phone]  If 7PM-7AM, please contact night-coverage [email or web address] Password [email or web address] 01/25/2019, 4:54 PM   LOS: 11 days

## 2019-01-26 LAB — BASIC METABOLIC PANEL
Anion gap: 8 (ref 5–15)
BUN: 33 mg/dL — ABNORMAL HIGH (ref 6–20)
CO2: 27 mmol/L (ref 22–32)
Calcium: 8.5 mg/dL — ABNORMAL LOW (ref 8.9–10.3)
Chloride: 103 mmol/L (ref 98–111)
Creatinine, Ser: 1.14 mg/dL — ABNORMAL HIGH (ref 0.44–1.00)
GFR calc Af Amer: >60 mL/min (ref >60)
GFR calc non Af Amer: 53 mL/min — ABNORMAL LOW (ref >60)
Glucose, Bld: 142 mg/dL — ABNORMAL HIGH (ref 70–99)
Potassium: 4.7 mmol/L (ref 3.5–5.1)
Sodium: 138 mmol/L (ref 135–145)

## 2019-01-26 LAB — MAGNESIUM: Magnesium: 2.7 mg/dL — ABNORMAL HIGH (ref 1.7–2.4)

## 2019-01-26 LAB — PHOSPHORUS: Phosphorus: 4.2 mg/dL (ref 2.5–4.6)

## 2019-01-26 NOTE — Progress Notes (Signed)
Dr. Maudie Mercury notified of HR in 40s while sleeping, otherwise vs WNL.

## 2019-01-26 NOTE — Progress Notes (Signed)
Subjective: She says she feels about the same.  She remains on high flow nasal cannula with 70% FiO2.  No other new complaints.  She is still coughing some and coughing up some sputum.  Objective: Vital signs in last 24 hours: Temp:  [97.7 F (36.5 C)-98.3 F (36.8 C)] 98.1 F (36.7 C) (09/27 0755) Pulse Rate:  [50-73] 53 (09/27 0755) Resp:  [11-26] 19 (09/27 0755) BP: (133-149)/(60-82) 149/82 (09/27 0000) SpO2:  [83 %-96 %] 92 % (09/27 0828) FiO2 (%):  [7 %-70 %] 70 % (09/27 0828) Weight:  [135.4 kg] 135.4 kg (09/27 0500) Weight change:  Last BM Date: 01/24/19  Intake/Output from previous day: 09/26 0701 - 09/27 0700 In: 243 [P.O.:240; I.V.:3] Out: 717 [Urine:700; Drains:17]  PHYSICAL EXAM General appearance: alert, cooperative, no distress and morbidly obese Resp: clear to auscultation bilaterally Cardio: regular rate and rhythm, S1, S2 normal, no murmur, click, rub or gallop GI: soft, non-tender; bowel sounds normal; no masses,  no organomegaly Extremities: extremities normal, atraumatic, no cyanosis or edema  Lab Results:  Results for orders placed or performed during the hospital encounter of 01/13/19 (from the past 48 hour(s))  Comprehensive metabolic panel     Status: Abnormal   Collection Time: 01/25/19  4:26 AM  Result Value Ref Range   Sodium 138 135 - 145 mmol/L   Potassium 4.2 3.5 - 5.1 mmol/L   Chloride 103 98 - 111 mmol/L   CO2 28 22 - 32 mmol/L   Glucose, Bld 140 (H) 70 - 99 mg/dL   BUN 33 (H) 6 - 20 mg/dL   Creatinine, Ser 0.96 0.44 - 1.00 mg/dL   Calcium 8.6 (L) 8.9 - 10.3 mg/dL   Total Protein 6.6 6.5 - 8.1 g/dL   Albumin 3.2 (L) 3.5 - 5.0 g/dL   AST 14 (L) 15 - 41 U/L   ALT 23 0 - 44 U/L   Alkaline Phosphatase 55 38 - 126 U/L   Total Bilirubin 0.6 0.3 - 1.2 mg/dL   GFR calc non Af Amer >60 >60 mL/min   GFR calc Af Amer >60 >60 mL/min   Anion gap 7 5 - 15    Comment: Performed at [facility], [address] [place], [place] [address]  Magnesium      Status: Abnormal   Collection Time: 01/25/19  4:26 AM  Result Value Ref Range   Magnesium 2.6 (H) 1.7 - 2.4 mg/dL    Comment: Performed at [facility], [address], [place],  [address]  CBC with Differential/Platelet     Status: Abnormal   Collection Time: 01/25/19  4:26 AM  Result Value Ref Range   WBC 17.4 (H) 4.0 - 10.5 K/uL   RBC 4.39 3.87 - 5.11 MIL/uL   Hemoglobin 14.0 12.0 - 15.0 g/dL   HCT 44.5 36.0 - 46.0 %   MCV 101.4 (H) 80.0 - 100.0 fL   MCH 31.9 26.0 - 34.0 pg   MCHC 31.5 30.0 - 36.0 g/dL   RDW 14.0 11.5 - 15.5 %   Platelets 288 150 - 400 K/uL   nRBC 0.0 0.0 - 0.2 %   Neutrophils Relative % 89 %   Neutro Abs 15.5 (H) 1.7 - 7.7 K/uL   Lymphocytes Relative 5 %   Lymphs Abs 0.9 0.7 - 4.0 K/uL   Monocytes Relative 4 %   Monocytes Absolute 0.7 0.1 - 1.0 K/uL   Eosinophils Relative 0 %   Eosinophils Absolute 0.0 0.0 - 0.5 K/uL  Basophils Relative 0 %   Basophils Absolute 0.0 0.0 - 0.1 K/uL   Immature Granulocytes 2 %   Abs Immature Granulocytes 0.28 (H) 0.00 - 0.07 K/uL    Comment: Performed at [facility], [address], [place], [place] [address]  Basic metabolic panel     Status: Abnormal   Collection Time: 01/26/19  5:13 AM  Result Value Ref Range   Sodium 138 135 - 145 mmol/L   Potassium 4.7 3.5 - 5.1 mmol/L   Chloride 103 98 - 111 mmol/L   CO2 27 22 - 32 mmol/L   Glucose, Bld 142 (H) 70 - 99 mg/dL   BUN 33 (H) 6 - 20 mg/dL   Creatinine, Ser 1.14 (H) 0.44 - 1.00 mg/dL   Calcium 8.5 (L) 8.9 - 10.3 mg/dL   GFR calc non Af Amer 53 (L) >60 mL/min   GFR calc Af Amer >60 >60 mL/min   Anion gap 8 5 - 15    Comment: Performed at [facility], [address] [place], [place] [address]  Magnesium     Status: Abnormal   Collection Time: 01/26/19  5:13 AM  Result Value Ref Range   Magnesium 2.7 (H) 1.7 - 2.4 mg/dL    Comment: Performed at [facility], [address] [place], [place] [address]  Phosphorus     Status: None   Collection Time: 01/26/19   5:13 AM  Result Value Ref Range   Phosphorus 4.2 2.5 - 4.6 mg/dL    Comment: Performed at [facility], [address] [place], [place] [address]    ABGS No results for input(s): PHART, PO2ART, TCO2, HCO3 in the last 72 hours.  Invalid input(s): PCO2 CULTURES No results found for this or any previous visit (from the past 240 hour(s)). Studies/Results: Dg Chest Port 1 View  Result Date: 01/24/2019 CLINICAL DATA:  Shortness of breath and pneumonia. EXAM: PORTABLE CHEST 1 VIEW COMPARISON:  01/22/2019 FINDINGS: Cardiac silhouette is stable. There are persistent bibasilar airspace opacities and possible small bilateral pleural effusions. No pneumothorax. No acute osseous abnormality. IMPRESSION: No significant interval change. Electronically Signed   By: Constance Holster M.D.   On: 01/24/2019 11:15    Medications:  Prior to Admission:  Medications Prior to Admission  Medication Sig Dispense Refill Last Dose  . albuterol (PROVENTIL) (2.5 MG/3ML) 0.083% nebulizer solution Take 2.5 mg by nebulization every 6 (six) hours as needed for wheezing or shortness of breath.   unknown  . albuterol (VENTOLIN HFA) 108 (90 Base) MCG/ACT inhaler Inhale 1-2 puffs into the lungs every 6 (six) hours as needed for wheezing or shortness of breath.   unknown  . cyclobenzaprine (FLEXERIL) 10 MG tablet Take 10 mg by mouth daily as needed for muscle spasms.   Past Month at Unknown time  . diphenhydrAMINE HCl (ALLERGY MEDICATION PO) Take 1 tablet by mouth at bedtime.   01/12/2019 at Unknown time  . gabapentin (NEURONTIN) 300 MG capsule Take 600 mg by mouth at bedtime.    01/12/2019 at Unknown time  . ibuprofen (ADVIL) 200 MG tablet Take 400 mg by mouth at bedtime.   01/12/2019 at Unknown time  . lisinopril-hydrochlorothiazide (ZESTORETIC) 10-12.5 MG tablet Take 1 tablet by mouth every morning.   01/13/2019 at Unknown time   Scheduled: . acetylcysteine  1 mL Nebulization BID  . arformoterol  15 mcg Nebulization BID   . budesonide (PULMICORT) nebulizer solution  0.5 mg Nebulization BID  . Chlorhexidine Gluconate Cloth  6 each Topical  Daily  . enoxaparin (LOVENOX) injection  40 mg Subcutaneous Q24H  . folic acid  1 mg Oral Daily  . guaiFENesin  1,200 mg Oral BID  . ipratropium-albuterol  3 mL Nebulization Q4H  . lisinopril  20 mg Oral Daily  . magnesium hydroxide  30 mL Oral BID  . mouth rinse  15 mL Mouth Rinse BID  . methylPREDNISolone (SOLU-MEDROL) injection  40 mg Intravenous Q12H  . saccharomyces boulardii  250 mg Oral BID  . sodium chloride flush  3 mL Intravenous Q12H  . vitamin B-12  500 mcg Oral Daily   Continuous: . sodium chloride     XJD:BZMCEY chloride, acetaminophen **OR** acetaminophen, diphenhydrAMINE **OR** diphenhydrAMINE, diphenhydrAMINE-zinc acetate, fentaNYL (SUBLIMAZE) injection, guaiFENesin-dextromethorphan, ipratropium-albuterol, labetalol, LORazepam, ondansetron **OR** ondansetron (ZOFRAN) IV, polyethylene glycol, polyvinyl alcohol, simethicone, sodium chloride flush, traMADol  Assesment: She was admitted with small bowel obstruction.  She eventually underwent surgery with hernia repair mesh placement and omentectomy.  She is doing well postop with the exception of her hypoxia.  She has acute probably on chronic hypoxic respiratory failure and she is requiring high flow nasal cannula still at 70%.  I think she likely has chronic hypoxic respiratory failure that has not been noted previously.  She has COPD exacerbation which is being treated  She had aspiration pneumonia during her time with a small bowel obstruction with nausea vomiting and she is being treated for that as well Principal Problem:   Small bowel obstruction (HCC) Active Problems:   COPD (chronic obstructive pulmonary disease) ([place])   Hypertension   Acute respiratory failure with hypoxia ([place])   Polycythemia   COPD with acute exacerbation ([place])   Incisional hernia, without obstruction or gangrene    Aspiration pneumonitis ([place])    Plan: Continue current treatments.  Continue trying to wean oxygen.    LOS: 12 days   Veronica Horton 01/26/2019, 8:31 AM

## 2019-01-26 NOTE — Progress Notes (Signed)
Subjective: This lady was admitted with small bowel obstruction and also now has respiratory failure due to aspiration and underlying morbid obesity.  She has done well from the surgical standpoint.  Unfortunately, she still requires FiO2 of 70% to maintain saturation in the mid 80s and close to 90%. This morning, she has no specific worsening symptoms.   Objective: Vital signs in last 24 hours: Temp:  [97.7 F (36.5 C)-98.3 F (36.8 C)] 98.1 F (36.7 C) (09/27 0755) Pulse Rate:  [50-73] 53 (09/27 0755) Resp:  [11-26] 19 (09/27 0755) BP: (133-149)/(60-82) 149/82 (09/27 0000) SpO2:  [83 %-96 %] 90 % (09/27 0820) FiO2 (%):  [7 %-70 %] 70 % (09/27 0820) Weight:  [135.4 kg] 135.4 kg (09/27 0500)  Intake/Output from previous day: 09/26 0701 - 09/27 0700 In: 243 [P.O.:240; I.V.:3] Out: 717 [Urine:700; Drains:17] Intake/Output this shift: No intake/output data recorded.  Recent Labs    01/24/19 0417 01/25/19 0426  HGB 14.2 14.0   Recent Labs    01/24/19 0417 01/25/19 0426  WBC 16.9* 17.4*  RBC 4.39 4.39  HCT 44.4 44.5  PLT 270 288   Recent Labs    01/25/19 0426 01/26/19 0513  NA 138 138  K 4.2 4.7  CL 103 103  CO2 28 27  BUN 33* 33*  CREATININE 0.96 1.14*  GLUCOSE 140* 142*  CALCIUM 8.6* 8.5*   No results for input(s): LABPT, INR in the last 72 hours.  She appears to be comfortable and is alert and orientated.  Vital signs are stable.  Lung fields are clinically clear except for some crackles in the right mid and lower zones.  Assessment/Plan: SBO -NG tube placed for decompressioninitially -01/15/19--incisional herniorrhaphy with mesh and omentectomy -having BMs and tolerating diet post-op -Optimize electrolytes  Acute respiratory failure with hypoxia -Secondary to COPD exacerbation with a component of aspiration pneumonitis -appreciate pulmonary consult  COPD exacerbation -ContinuePulmicort -Continueduo nebs -reStartedIV Solu-Medrol 9/23 -added  brovana and mucomyst  Essential hypertension -Restartedlisinopril -Hydralazine as neededSBP>180  Peripheral neuropathy -Serum S28--315 -Folic VVOH--6.0 -supplement B12 and folate    Nimish C Gosrani 01/26/2019, 8:25 AM

## 2019-01-27 MED ORDER — FUROSEMIDE 10 MG/ML IJ SOLN
20.0000 mg | Freq: Once | INTRAMUSCULAR | Status: AC
  Administered 2019-01-27: 20 mg via INTRAVENOUS
  Filled 2019-01-27: qty 2

## 2019-01-27 NOTE — Progress Notes (Signed)
Subjective: She says she feels a little better.  She is coughing up some sputum.  She remains on high flow nasal cannula.  Objective: Vital signs in last 24 hours: Temp:  [98 F (36.7 C)-98.5 F (36.9 C)] 98 F (36.7 C) (09/28 0400) Pulse Rate:  [45-66] 45 (09/28 0600) Resp:  [14-22] 18 (09/28 0600) BP: (85-152)/(54-86) 129/68 (09/28 0600) SpO2:  [77 %-93 %] 91 % (09/28 0600) FiO2 (%):  [70 %] 70 % (09/28 0409) Weight change:  Last BM Date: 01/26/19  Intake/Output from previous day: 09/27 0701 - 09/28 0700 In: -  Out: 10 [Drains:10]  PHYSICAL EXAM General appearance: alert, cooperative and morbidly obese Resp: rhonchi bilaterally Cardio: regular rate and rhythm, S1, S2 normal, no murmur, click, rub or gallop GI: soft, non-tender; bowel sounds normal; no masses,  no organomegaly Extremities: extremities normal, atraumatic, no cyanosis or edema  Lab Results:  Results for orders placed or performed during the hospital encounter of 01/13/19 (from the past 48 hour(s))  Basic metabolic panel     Status: Abnormal   Collection Time: 01/26/19  5:13 AM  Result Value Ref Range   Sodium 138 135 - 145 mmol/L   Potassium 4.7 3.5 - 5.1 mmol/L   Chloride 103 98 - 111 mmol/L   CO2 27 22 - 32 mmol/L   Glucose, Bld 142 (H) 70 - 99 mg/dL   BUN 33 (H) 6 - 20 mg/dL   Creatinine, Ser 1.14 (H) 0.44 - 1.00 mg/dL   Calcium 8.5 (L) 8.9 - 10.3 mg/dL   GFR calc non Af Amer 53 (L) >60 mL/min   GFR calc Af Amer >60 >60 mL/min   Anion gap 8 5 - 15    Comment: Performed at [facility], [address] [place], [place] [address]  Magnesium     Status: Abnormal   Collection Time: 01/26/19  5:13 AM  Result Value Ref Range   Magnesium 2.7 (H) 1.7 - 2.4 mg/dL    Comment: Performed at [facility], [address], [place], [place] [address]  Phosphorus     Status: None   Collection Time: 01/26/19  5:13 AM  Result Value Ref Range   Phosphorus 4.2 2.5 - 4.6 mg/dL    Comment: Performed at [facility], [address], [place], [place] [address]    ABGS No results for input(s): PHART, PO2ART, TCO2, HCO3 in the last 72 hours.  Invalid input(s): PCO2 CULTURES No results found for this or any previous visit (from the past 240 hour(s)). Studies/Results: No results found.  Medications:  Prior to Admission:  Medications Prior to Admission  Medication Sig Dispense Refill Last Dose  . albuterol (PROVENTIL) (2.5 MG/3ML) 0.083% nebulizer solution Take 2.5 mg by nebulization every 6 (six) hours as needed for wheezing or shortness of breath.   unknown  . albuterol (VENTOLIN HFA) 108 (90 Base) MCG/ACT inhaler Inhale 1-2 puffs into the lungs every 6 (six) hours as needed for wheezing or shortness of breath.   unknown  . cyclobenzaprine (FLEXERIL) 10 MG tablet Take 10 mg by mouth daily as needed for muscle spasms.   Past Month at Unknown time  . diphenhydrAMINE HCl (ALLERGY MEDICATION PO) Take 1 tablet by mouth at bedtime.   01/12/2019 at Unknown time  . gabapentin (NEURONTIN) 300 MG capsule Take 600 mg by mouth at bedtime.    01/12/2019 at Unknown time  . ibuprofen (ADVIL) 200 MG tablet Take 400 mg by mouth at bedtime.   01/12/2019 at  Unknown time  . lisinopril-hydrochlorothiazide (ZESTORETIC) 10-12.5 MG tablet Take 1 tablet by mouth every morning.   01/13/2019 at Unknown time   Scheduled: . acetylcysteine  1 mL Nebulization BID  . arformoterol  15 mcg Nebulization BID  . budesonide (PULMICORT) nebulizer solution  0.5 mg Nebulization BID  . Chlorhexidine Gluconate Cloth  6 each Topical Daily  . enoxaparin (LOVENOX) injection  40 mg Subcutaneous Q24H  . folic acid  1 mg Oral Daily  . guaiFENesin  1,200 mg Oral BID  . ipratropium-albuterol  3 mL Nebulization Q4H  . lisinopril  20 mg Oral Daily  . magnesium hydroxide  30 mL Oral BID  . mouth rinse  15 mL Mouth Rinse BID  . methylPREDNISolone (SOLU-MEDROL) injection  40 mg Intravenous Q12H  . saccharomyces boulardii  250 mg Oral BID  . sodium  chloride flush  3 mL Intravenous Q12H  . vitamin B-12  500 mcg Oral Daily   Continuous: . sodium chloride     KYH:CWCBJS chloride, acetaminophen **OR** acetaminophen, diphenhydrAMINE **OR** diphenhydrAMINE, diphenhydrAMINE-zinc acetate, fentaNYL (SUBLIMAZE) injection, guaiFENesin-dextromethorphan, ipratropium-albuterol, labetalol, LORazepam, ondansetron **OR** ondansetron (ZOFRAN) IV, polyethylene glycol, polyvinyl alcohol, simethicone, sodium chloride flush, traMADol  Assesment: She was admitted with abdominal pain and small bowel obstruction.  She had a incisional hernia and eventually underwent repair with mesh and had omentectomy.  She is done well from a surgical point of view.  She has acute hypoxic respiratory failure and I suspect that she has chronic hypoxic respiratory failure as well that has not been documented.  She remains on 70% oxygen with O2 sats that are only fair.  At this moment she is saturating at 92% so we can probably turn her oxygen down at least temporarily  She has COPD exacerbation which is being treated  She has had aspiration pneumonia and that is probably the source of some of her hypoxia. Principal Problem:   Small bowel obstruction (HCC) Active Problems:   COPD (chronic obstructive pulmonary disease) (HCC)   Hypertension   Acute respiratory failure with hypoxia (HCC)   Polycythemia   COPD with acute exacerbation (HCC)   Incisional hernia, without obstruction or gangrene   Aspiration pneumonitis ([place])    Plan: Continue treatments.  See if we can wean her oxygen    LOS: 13 days   Alonza Bogus 01/27/2019, 7:28 AM

## 2019-01-27 NOTE — Progress Notes (Signed)
Physical Therapy Treatment Patient Details Name: Veronica Horton MRN: [id number] DOB: 23-Nov-1960 Today's Date: 01/27/2019    History of Present Illness MONTEEN TOOPS is a 58 y.o. female with medical history significant for COPD, hypertension, hiatal hernia, and history of cholecystectomy, now presenting to the emergency department for evaluation of abdominal pain with nausea and vomiting.  Patient reports that she been in her usual state of health until last night when she developed abdominal pain with nausea and vomiting.  Pain and nausea/vomiting persisted throughout the night and through today, not necessarily worsening, but not improving either.  She also had a marked increase in her chronic cough last night accompanied by thick sputum production.  She does not feel particularly short of breath.  She has not noted any lower extremity swelling or tenderness.  She denies any fevers or chills.  She has not had these symptoms previously.  She denies diarrhea, melena, or hematochezia.    PT Comments    Pt sitting up in chair.  Declined ambulation but agreed to therex.  Completed either reclined or seated, 2 sets of 5 or 2 sets of 10 each exercise.  Pt verbalized that she is getting up and walking in room.  No fatique or issues noted during session.    Follow Up Recommendations              Precautions / Restrictions Precautions Precautions: Fall          Cognition Arousal/Alertness: Awake/alert Behavior During Therapy: WFL for tasks assessed/performed Overall Cognitive Status: Within Functional Limits for tasks assessed                                        Exercises General Exercises - Lower Extremity Ankle Circles/Pumps: AROM;20 reps;Both Long Arc Quad: AROM;Both;20 reps Heel Slides: AROM;Both;10 reps Hip ABduction/ADduction: AROM;Both;10 reps Straight Leg Raises: AROM;Both;10 reps Hip Flexion/Marching: AROM;Both;20 reps Toe Raises: AROM;Both;20 reps         Pertinent Vitals/Pain Pain Assessment: No/denies pain      End of Session   Activity Tolerance: Patient tolerated treatment well Patient left: in chair;with call bell/phone within reach   PT Visit Diagnosis: Unsteadiness on feet (R26.81);Other abnormalities of gait and mobility (R26.89);Muscle weakness (generalized) (M62.81)     Time: 2694-8546 PT Time Calculation (min) (ACUTE ONLY): 10 min  Charges:  $Therapeutic Exercise: 8-22 mins                    Teena Irani, PTA/CLT [place], Gurshan Settlemire B 01/27/2019, 12:00 PM

## 2019-01-27 NOTE — Progress Notes (Addendum)
PROGRESS NOTE  EMER ONNEN FVC:[id number] DOB: April 27, 1961 DOA: 01/13/2019 PCP: Sharilyn Sites, MD   Brief History: 58 y/o female with history of COPD, HTN, tobacco abuse, morbid obesity presenting with abdominal pain with nausea and vomiting that started evening 01/12/19 and progressively worsened throughout the day on 01/13/19. She also has had increasing cough with clear sputum x 1-2 days. She denies f/c, cp, hemoptysis, dysuria, hematuria, hematochezia, melena. She had some dyspnea on exertion, but stated it was not any worse than usual. Her last BM was on 01/12/19. She denies any new medications, but continues to smoke 1ppd.  In the ED, she was afebrile and hemodynamically stable. BMP, LFTs and CBC were essentially unremarkable except some polycythemia and WBC 11.3. The patient was afebrile hemodynamically stable but was hypoxic with oxygen saturation 87% on room air. CT of abd/pelvis showed SBO secondary to a large paraumbilical ventral hernia without strangulation. NG was placed for decompression. General surgery was consulted to assist. Postop, the patient had persistent hypoxemia.  CTA chest was negative for PE.  Pulmonary medicine was consulted to assist with management. Her hospital stay has been prolonged due to persisted hypoxemia.  Assessment/Plan: SBO -NG tube placed for decompressioninitially -01/15/19--incisional herniorrhaphy with mesh and omentectomy -having BMs and tolerating diet post-op -Optimize electrolytes  Acute respiratory failure with hypoxia -Secondary to COPD exacerbation with a component of aspiration pneumonitis -appreciate pulmonary consult -repeat PCT <0.10 -01/17/19 CTA chest neg for PE but showed dependent debris in distal trachea and obstructed RLL bronchus with mucus plug -repeat CXR--personally reviewed--bibasilar opacitiesunchanged -BNP--66 -Stable onHF NCpresentlyat70% -Wean oxygen for saturation greater85-88% -added  mucomyst -added brovana -suspect persistent hypoxia due to atelectasis and mucus plugging -repeat lasix IV x 1 -increase activity  COPD exacerbation -ContinuePulmicort -Continueduo nebs -reStartedIV Solu-Medrol 9/23 -added brovana and mucomyst  Essential hypertension -Restartedlisinopril -Hydralazine as neededSBP>180  Peripheral neuropathy -Serum M38--466 -Folic ZLDJ--5.7 -supplement B12 and folate  Hypokalemia -replete -check mag--2.6     Disposition Plan:remain in SDU due to high oxygenrequirementrequirement Family Communication:spouse updated at bedside 9/28  Consultants:General surgery, pulmonary  Code Status: FULL   DVT Prophylaxis: Ada Lovenox    Procedures: As Listed in Progress Note Above       Subjective: Patient denies fevers, chills, headache, chest pain, dyspnea, nausea, vomiting, diarrhea, abdominal pain, dysuria, hematuria, hematochezia, and melena.   Objective: Vitals:   01/27/19 1500 01/27/19 1600 01/27/19 1629 01/27/19 1705  BP: (!) 155/73 (!) 142/74    Pulse: 68 64    Resp: 18 (!) 21    Temp:   98.8 F (37.1 C)   TempSrc:   Oral   SpO2: (!) 88% (!) 89%  91%  Weight:      Height:        Intake/Output Summary (Last 24 hours) at 01/27/2019 1722 Last data filed at 01/27/2019 0940 Gross per 24 hour  Intake 3 ml  Output --  Net 3 ml   Weight change:  Exam:   General:  Pt is alert, follows commands appropriately, not in acute distress  HEENT: No icterus, No thrush, No neck mass, [place]/AT  Cardiovascular: RRR, S1/S2, no rubs, no gallops  Respiratory: bibasilar rales. No wheese  Abdomen: Soft/+BS, non tender, non distended, no guarding  Extremities: 1+ LE edema, No lymphangitis, No petechiae, No rashes, no synovitis   Data Reviewed: I have personally reviewed following labs and imaging studies Basic Metabolic Panel: Recent Labs  Lab 01/22/19 0858 01/24/19  7673 01/25/19 0426 01/26/19 0513   NA 140 139 138 138  K 3.4* 4.5 4.2 4.7  CL 104 105 103 103  CO2 28 26 28 27   GLUCOSE 109* 144* 140* 142*  BUN 12 26* 33* 33*  CREATININE 0.92 1.01* 0.96 1.14*  CALCIUM 8.3* 8.7* 8.6* 8.5*  MG  --  2.6* 2.6* 2.7*  PHOS  --   --   --  4.2   Liver Function Tests: Recent Labs  Lab 01/25/19 0426  AST 14*  ALT 23  ALKPHOS 55  BILITOT 0.6  PROT 6.6  ALBUMIN 3.2*   No results for input(s): LIPASE, AMYLASE in the last 168 hours. No results for input(s): AMMONIA in the last 168 hours. Coagulation Profile: No results for input(s): INR, PROTIME in the last 168 hours. CBC: Recent Labs  Lab 01/22/19 0858 01/24/19 0417 01/25/19 0426  WBC 11.4* 16.9* 17.4*  NEUTROABS  --   --  15.5*  HGB 14.2 14.2 14.0  HCT 45.0 44.4 44.5  MCV 101.4* 101.1* 101.4*  PLT 216 270 288   Cardiac Enzymes: No results for input(s): CKTOTAL, CKMB, CKMBINDEX, TROPONINI in the last 168 hours. BNP: Invalid input(s): POCBNP CBG: No results for input(s): GLUCAP in the last 168 hours. HbA1C: No results for input(s): HGBA1C in the last 72 hours. Urine analysis:    Component Value Date/Time   COLORURINE YELLOW 01/13/2019 1730   APPEARANCEUR CLOUDY (A) 01/13/2019 1730   LABSPEC 1.017 01/13/2019 1730   PHURINE 8.0 01/13/2019 1730   GLUCOSEU NEGATIVE 01/13/2019 1730   HGBUR NEGATIVE 01/13/2019 1730   BILIRUBINUR NEGATIVE 01/13/2019 1730   KETONESUR NEGATIVE 01/13/2019 1730   PROTEINUR 100 (A) 01/13/2019 1730   UROBILINOGEN 0.2 09/25/2010 0530   NITRITE NEGATIVE 01/13/2019 1730   LEUKOCYTESUR NEGATIVE 01/13/2019 1730   Sepsis Labs: @LABRCNTIP (procalcitonin:4,lacticidven:4) )No results found for this or any previous visit (from the past 240 hour(s)).   Scheduled Meds: . acetylcysteine  1 mL Nebulization BID  . arformoterol  15 mcg Nebulization BID  . budesonide (PULMICORT) nebulizer solution  0.5 mg Nebulization BID  . Chlorhexidine Gluconate Cloth  6 each Topical Daily  . enoxaparin (LOVENOX)  injection  40 mg Subcutaneous Q24H  . folic acid  1 mg Oral Daily  . furosemide  20 mg Intravenous Once  . guaiFENesin  1,200 mg Oral BID  . ipratropium-albuterol  3 mL Nebulization Q4H  . lisinopril  20 mg Oral Daily  . magnesium hydroxide  30 mL Oral BID  . mouth rinse  15 mL Mouth Rinse BID  . methylPREDNISolone (SOLU-MEDROL) injection  40 mg Intravenous Q12H  . saccharomyces boulardii  250 mg Oral BID  . sodium chloride flush  3 mL Intravenous Q12H  . vitamin B-12  500 mcg Oral Daily   Continuous Infusions: . sodium chloride      Procedures/Studies: Ct Angio Chest Pe W Or Wo Contrast  Result Date: 01/17/2019 CLINICAL DATA:  Dyspnea chronically. Two days postop small-bowel obstruction and hernia repair. EXAM: CT ANGIOGRAPHY CHEST WITH CONTRAST TECHNIQUE: Multidetector CT imaging of the chest was performed using the standard protocol during bolus administration of intravenous contrast. Multiplanar CT image reconstructions and MIPs were obtained to evaluate the vascular anatomy. CONTRAST:  140mL OMNIPAQUE IOHEXOL 350 MG/ML SOLN COMPARISON:  Chest CT 10/03/2010 and abdominal CT 01/13/2019 FINDINGS: Cardiovascular: Mild cardiomegaly. Minimal calcified plaque over the left anterior descending coronary artery. Thoracic aorta is normal. Pulmonary arterial system is well opacified without evidence of emboli. Aberrant right  subclavian artery. Remaining vascular structures are unremarkable. Mediastinum/Nodes: No significant mediastinal or hilar adenopathy. Lungs/Pleura: Lungs are adequately inflated demonstrate moderate consolidation over the lower lobes bilaterally likely atelectasis. Mild atelectatic change over the right middle lobe and lingula. Subtle dependent atelectasis over the posterior right upper lobe. No significant effusion. Dependent debris over the distal trachea and right mainstem bronchus likely aspirate material. Obstructed right posterior lower lobe bronchus which may be due to mucous  plugging versus aspiration. Narrowed right lower lobe bronchi. Upper Abdomen: No acute findings. Musculoskeletal: Degenerative change of the spine. Review of the MIP images confirms the above findings. IMPRESSION: 1.  No evidence of pulmonary embolism. 2. Significant bilateral lower lobe consolidation likely atelectasis. Mild atelectasis over the right middle lobe and lingula as well as minimal dependent atelectasis over the right upper lobe. No effusion. Aspirate material over the distal trachea and right mainstem bronchus. 3. Cardiomegaly with minimal atherosclerotic coronary artery disease. Electronically Signed   By: Marin Olp M.D.   On: 01/17/2019 10:12   Ct Abdomen Pelvis W Contrast  Result Date: 01/13/2019 CLINICAL DATA:  Right-sided abdominal pain and nausea and vomiting beginning last night. Prior cholecystectomy. EXAM: CT ABDOMEN AND PELVIS WITH CONTRAST TECHNIQUE: Multidetector CT imaging of the abdomen and pelvis was performed using the standard protocol following bolus administration of intravenous contrast. CONTRAST:  169mL OMNIPAQUE IOHEXOL 300 MG/ML  SOLN COMPARISON:  05/14/2012 FINDINGS: Lower Chest: Increased linear opacity in both lung bases since 2014, which may be due to subsegmental atelectasis or scarring. Hepatobiliary: No hepatic masses identified. Prior cholecystectomy. No evidence of biliary obstruction. Pancreas:  No mass or inflammatory changes. Spleen: Within normal limits in size and appearance. Adrenals/Urinary Tract: No masses identified. Several small left renal cysts again noted. No evidence of hydronephrosis. Stomach/Bowel: Large paraumbilical ventral hernia is again seen containing multiple small bowel loops. Moderate dilatation of small bowel is seen within the hernia and proximal to the hernia, consistent with small-bowel obstruction. No evidence of small bowel wall thickening or pneumatosis. No evidence of inflammatory process or abnormal fluid collections.  Diverticulosis is seen mainly involving the descending and sigmoid colon, however there is no evidence of diverticulitis. Vascular/Lymphatic: No pathologically enlarged lymph nodes. No abdominal aortic aneurysm. Aortic atherosclerosis. Reproductive:  No mass or other significant abnormality. Other:  None. Musculoskeletal:  No suspicious bone lesions identified. IMPRESSION: 1. Small bowel obstruction due to large paraumbilical ventral hernia, which contains multiple small bowel loops. No signs of bowel strangulation. 2. Colonic diverticulosis, without radiographic evidence of diverticulitis. Aortic Atherosclerosis (ICD10-I70.0). Electronically Signed   By: Marlaine Hind M.D.   On: 01/13/2019 18:50   Dg Chest Port 1 View  Result Date: 01/24/2019 CLINICAL DATA:  Shortness of breath and pneumonia. EXAM: PORTABLE CHEST 1 VIEW COMPARISON:  01/22/2019 FINDINGS: Cardiac silhouette is stable. There are persistent bibasilar airspace opacities and possible small bilateral pleural effusions. No pneumothorax. No acute osseous abnormality. IMPRESSION: No significant interval change. Electronically Signed   By: Constance Holster M.D.   On: 01/24/2019 11:15   Dg Chest Port 1 View  Result Date: 01/22/2019 CLINICAL DATA:  Acute respiratory failure with hypoxia, history COPD, hypertension EXAM: PORTABLE CHEST 1 VIEW COMPARISON:  Portable exam 0941 hours compared to 01/20/2019 FINDINGS: Enlargement of cardiac silhouette. BILATERAL pulmonary infiltrates in the mid to lower lungs. Associated small RIGHT pleural effusion and basilar atelectasis. Lung apices clear. No pneumothorax. IMPRESSION: Bibasilar infiltrates slightly increased from previous exam with associated small RIGHT pleural effusion and mild RIGHT basilar  atelectasis. Electronically Signed   By: Lavonia Dana M.D.   On: 01/22/2019 10:03   Dg Chest Port 1 View  Result Date: 01/20/2019 CLINICAL DATA:  Hypoxia. EXAM: PORTABLE CHEST 1 VIEW COMPARISON:  Radiograph of  January 17, 2019. FINDINGS: Stable cardiomegaly. No pneumothorax is noted. Stable bibasilar atelectasis or infiltrates are noted with possible small pleural effusions. Bony thorax is unremarkable. IMPRESSION: Stable bibasilar opacities and small effusions as described above Electronically Signed   By: Marijo Conception M.D.   On: 01/20/2019 15:01   Dg Chest Port 1 View  Result Date: 01/17/2019 CLINICAL DATA:  Acute respiratory failure. EXAM: PORTABLE CHEST 1 VIEW COMPARISON:  Radiograph of January 16, 2019. FINDINGS: Stable cardiomediastinal silhouette. No pneumothorax is noted. Stable bibasilar atelectasis or infiltrates are noted. Small bilateral pleural effusions are noted as well. Bony thorax unremarkable. IMPRESSION: Stable bibasilar opacities as described above. Electronically Signed   By: Marijo Conception M.D.   On: 01/17/2019 07:36   Dg Chest Port 1 View  Result Date: 01/16/2019 CLINICAL DATA:  Hypoxia today. EXAM: PORTABLE CHEST 1 VIEW COMPARISON:  Single-view of the chest 01/13/2019. PA and lateral chest 09/25/2010. CT chest 10/03/2010. FINDINGS: NG tube has been removed. There is cardiomegaly and interstitial edema. Right pleural effusion and basilar airspace disease have increased. Left basilar airspace disease is mildly improved. No pneumothorax. IMPRESSION: No marked change in cardiomegaly and interstitial edema. Increased small right pleural effusion and basilar atelectasis. Left pleural effusion is slightly decreased. Electronically Signed   By: Inge Rise M.D.   On: 01/16/2019 09:34   Dg Chest Portable 1 View  Result Date: 01/13/2019 CLINICAL DATA:  NG tube placement EXAM: PORTABLE CHEST 1 VIEW COMPARISON:  Radiograph 01/13/2019 FINDINGS: Interval placement of a transesophageal tube with tip and side port distal to the GE junction, curling within the gastric lumen in the left upper quadrant. Redemonstration of the diffuse interstitial opacities and moderate cardiomegaly. No  pneumothorax. No new consolidative process. No acute osseous or soft tissue abnormality. Degenerative changes are present in the imaged spine and shoulders. IMPRESSION: 1. Appropriate positioning of the transesophageal tube with the tip and side port distal to the GE junction 2. Slightly diminished lung volumes with persistent interstitial pulmonary edema and cardiomegaly. Electronically Signed   By: Lovena Le M.D.   On: 01/13/2019 20:15   Dg Chest Portable 1 View  Result Date: 01/13/2019 CLINICAL DATA:  Cough. Nausea and vomiting. Right-sided abdominal pain. EXAM: PORTABLE CHEST 1 VIEW COMPARISON:  09/25/2010 FINDINGS: Mild-to-moderate cardiomegaly. Diffuse interstitial infiltrates, suspicious for interstitial edema. Stable linear opacity in right lower lung, consistent with scarring. No evidence of pulmonary consolidation or pleural effusion. IMPRESSION: Cardiomegaly and diffuse interstitial infiltrates, suspicious for mild congestive heart failure. Electronically Signed   By: Marlaine Hind M.D.   On: 01/13/2019 16:41    Orson Eva, DO  [facility] Pager [phone]  If 7PM-7AM, please contact night-coverage [email or web address] Password [email or web address] 01/27/2019, 5:22 PM   LOS: 13 days

## 2019-01-28 ENCOUNTER — Inpatient Hospital Stay (HOSPITAL_COMMUNITY): Payer: Self-pay

## 2019-01-28 LAB — BASIC METABOLIC PANEL
Anion gap: 9 (ref 5–15)
BUN: 38 mg/dL — ABNORMAL HIGH (ref 6–20)
CO2: 28 mmol/L (ref 22–32)
Calcium: 8.5 mg/dL — ABNORMAL LOW (ref 8.9–10.3)
Chloride: 104 mmol/L (ref 98–111)
Creatinine, Ser: 1.12 mg/dL — ABNORMAL HIGH (ref 0.44–1.00)
GFR calc Af Amer: >60 mL/min (ref >60)
GFR calc non Af Amer: 54 mL/min — ABNORMAL LOW (ref >60)
Glucose, Bld: 97 mg/dL (ref 70–99)
Potassium: 4.2 mmol/L (ref 3.5–5.1)
Sodium: 141 mmol/L (ref 135–145)

## 2019-01-28 LAB — CBC
HCT: 45.2 % (ref 36.0–46.0)
Hemoglobin: 14.3 g/dL (ref 12.0–15.0)
MCH: 32.1 pg (ref 26.0–34.0)
MCHC: 31.6 g/dL (ref 30.0–36.0)
MCV: 101.3 fL — ABNORMAL HIGH (ref 80.0–100.0)
Platelets: 249 10*3/uL (ref 150–400)
RBC: 4.46 MIL/uL (ref 3.87–5.11)
RDW: 14.2 % (ref 11.5–15.5)
WBC: 16.6 10*3/uL — ABNORMAL HIGH (ref 4.0–10.5)
nRBC: 0 % (ref 0.0–0.2)

## 2019-01-28 MED ORDER — FUROSEMIDE 10 MG/ML IJ SOLN
20.0000 mg | Freq: Once | INTRAMUSCULAR | Status: AC
  Administered 2019-01-28: 20 mg via INTRAVENOUS
  Filled 2019-01-28: qty 2

## 2019-01-28 MED ORDER — ENOXAPARIN SODIUM 80 MG/0.8ML ~~LOC~~ SOLN
70.0000 mg | SUBCUTANEOUS | Status: DC
  Administered 2019-01-29 – 2019-01-30 (×2): 70 mg via SUBCUTANEOUS
  Filled 2019-01-28 (×2): qty 0.8

## 2019-01-28 NOTE — Progress Notes (Signed)
PROGRESS NOTE  MARCEL SORTER ZES:[id number] DOB: 1960/05/09 DOA: 01/13/2019 PCP: Sharilyn Sites, MD  Brief History: 58 y/o female with history of COPD, HTN, tobacco abuse, morbid obesity presenting with abdominal pain with nausea and vomiting that started evening 01/12/19 and progressively worsened throughout the day on 01/13/19. She also has had increasing cough with clear sputum x 1-2 days. She denies f/c, cp, hemoptysis, dysuria, hematuria, hematochezia, melena. She had some dyspnea on exertion, but stated it was not any worse than usual. Her last BM was on 01/12/19. She denies any new medications, but continues to smoke 1ppd.  In the ED, she was afebrile and hemodynamically stable. BMP, LFTs and CBC were essentially unremarkable except some polycythemia and WBC 11.3. The patient was afebrile hemodynamically stable but was hypoxic with oxygen saturation 87% on room air. CT of abd/pelvis showed SBO secondary to a large paraumbilical ventral hernia without strangulation. NG was placed for decompression. General surgery was consulted to assist.Postop, the patient had persistent hypoxemia. CTA chest was negative for PE. Pulmonary medicine was consulted to assist with management. Her hospital stay has been prolonged due to persisted hypoxemia.  Assessment/Plan: SBO -NG tube placed for decompressioninitially -01/15/19--incisional herniorrhaphy with mesh and omentectomy -having BMs and tolerating diet post-op -Optimize electrolytes  Acute respiratory failure with hypoxia -Secondary to COPD exacerbation with a component of aspiration pneumonitis -finished abx for aspiration -appreciate pulmonary consult -repeat PCT <0.10 -01/17/19 CTA chest neg for PE but showed dependent debris in distal trachea and obstructed RLL bronchus with mucus plug -repeat CXR--personally reviewed--bibasilar opacitiesunchanged -BNP--66 -Stable onHF NCpresentlyat15L HFNC -Wean oxygen for  saturation greater85-88% -addedmucomyst -addedbrovana -suspect persistent hypoxia due to atelectasis and mucus plugging -IV lasix daily x 3 seems to have helped decrease FiO2--repeat today -increase activity  COPD exacerbation -ContinuePulmicort -Continueduo nebs -reStartedIV Solu-Medrol 9/23 -added brovana and mucomyst  Essential hypertension -Restartedlisinopril -Hydralazine as neededSBP>180  Peripheral neuropathy -Serum U63--335 -Folic KTGY--5.6 -supplement B12 and folate  Hypokalemia -replete -check mag--2.6     Subjective: Patient denies fevers, chills, headache, chest pain, dyspnea, nausea, vomiting, diarrhea, abdominal pain, dysuria, hematuria, hematochezia, and melena.   Objective: Vitals:   01/28/19 1400 01/28/19 1500 01/28/19 1517 01/28/19 1635  BP: 132/60     Pulse: 60  64   Resp:   17   Temp:    98.6 F (37 C)  TempSrc:    Oral  SpO2: 90% 91% 92%   Weight:      Height:        Intake/Output Summary (Last 24 hours) at 01/28/2019 1645 Last data filed at 01/28/2019 1320 Gross per 24 hour  Intake 6 ml  Output 1575 ml  Net -1569 ml   Weight change:  Exam:   General:  Pt is alert, follows commands appropriately, not in acute distress  HEENT: No icterus, No thrush, No neck mass, [place]/AT  Cardiovascular: RRR, S1/S2, no rubs, no gallops  Respiratory: bibasilar crackles, no wheeze  Abdomen: Soft/+BS, non tender, non distended, no guarding  Extremities: 1 + LE edema, No lymphangitis, No petechiae, No rashes, no synovitis   Data Reviewed: I have personally reviewed following labs and imaging studies Basic Metabolic Panel: Recent Labs  Lab 01/22/19 0858 01/24/19 0417 01/25/19 0426 01/26/19 0513 01/28/19 0411  NA 140 139 138 138 141  K 3.4* 4.5 4.2 4.7 4.2  CL 104 105 103 103 104  CO2 28 26 28 27 28   GLUCOSE 109* 144* 140* 142*  97  BUN 12 26* 33* 33* 38*  CREATININE 0.92 1.01* 0.96 1.14* 1.12*  CALCIUM 8.3* 8.7* 8.6* 8.5*  8.5*  MG  --  2.6* 2.6* 2.7*  --   PHOS  --   --   --  4.2  --    Liver Function Tests: Recent Labs  Lab 01/25/19 0426  AST 14*  ALT 23  ALKPHOS 55  BILITOT 0.6  PROT 6.6  ALBUMIN 3.2*   No results for input(s): LIPASE, AMYLASE in the last 168 hours. No results for input(s): AMMONIA in the last 168 hours. Coagulation Profile: No results for input(s): INR, PROTIME in the last 168 hours. CBC: Recent Labs  Lab 01/22/19 0858 01/24/19 0417 01/25/19 0426 01/28/19 0411  WBC 11.4* 16.9* 17.4* 16.6*  NEUTROABS  --   --  15.5*  --   HGB 14.2 14.2 14.0 14.3  HCT 45.0 44.4 44.5 45.2  MCV 101.4* 101.1* 101.4* 101.3*  PLT 216 270 288 249   Cardiac Enzymes: No results for input(s): CKTOTAL, CKMB, CKMBINDEX, TROPONINI in the last 168 hours. BNP: Invalid input(s): POCBNP CBG: No results for input(s): GLUCAP in the last 168 hours. HbA1C: No results for input(s): HGBA1C in the last 72 hours. Urine analysis:    Component Value Date/Time   COLORURINE YELLOW 01/13/2019 1730   APPEARANCEUR CLOUDY (A) 01/13/2019 1730   LABSPEC 1.017 01/13/2019 1730   PHURINE 8.0 01/13/2019 1730   GLUCOSEU NEGATIVE 01/13/2019 1730   HGBUR NEGATIVE 01/13/2019 1730   BILIRUBINUR NEGATIVE 01/13/2019 1730   KETONESUR NEGATIVE 01/13/2019 1730   PROTEINUR 100 (A) 01/13/2019 1730   UROBILINOGEN 0.2 09/25/2010 0530   NITRITE NEGATIVE 01/13/2019 1730   LEUKOCYTESUR NEGATIVE 01/13/2019 1730   Sepsis Labs: @LABRCNTIP (procalcitonin:4,lacticidven:4) )No results found for this or any previous visit (from the past 240 hour(s)).   Scheduled Meds: . acetylcysteine  1 mL Nebulization BID  . arformoterol  15 mcg Nebulization BID  . budesonide (PULMICORT) nebulizer solution  0.5 mg Nebulization BID  . Chlorhexidine Gluconate Cloth  6 each Topical Daily  . [START ON 01/29/2019] enoxaparin (LOVENOX) injection  70 mg Subcutaneous Q24H  . folic acid  1 mg Oral Daily  . furosemide  20 mg Intravenous Once  .  guaiFENesin  1,200 mg Oral BID  . ipratropium-albuterol  3 mL Nebulization Q4H  . lisinopril  20 mg Oral Daily  . magnesium hydroxide  30 mL Oral BID  . mouth rinse  15 mL Mouth Rinse BID  . methylPREDNISolone (SOLU-MEDROL) injection  40 mg Intravenous Q12H  . saccharomyces boulardii  250 mg Oral BID  . sodium chloride flush  3 mL Intravenous Q12H  . vitamin B-12  500 mcg Oral Daily   Continuous Infusions: . sodium chloride      Procedures/Studies: Ct Angio Chest Pe W Or Wo Contrast  Result Date: 01/17/2019 CLINICAL DATA:  Dyspnea chronically. Two days postop small-bowel obstruction and hernia repair. EXAM: CT ANGIOGRAPHY CHEST WITH CONTRAST TECHNIQUE: Multidetector CT imaging of the chest was performed using the standard protocol during bolus administration of intravenous contrast. Multiplanar CT image reconstructions and MIPs were obtained to evaluate the vascular anatomy. CONTRAST:  186mL OMNIPAQUE IOHEXOL 350 MG/ML SOLN COMPARISON:  Chest CT 10/03/2010 and abdominal CT 01/13/2019 FINDINGS: Cardiovascular: Mild cardiomegaly. Minimal calcified plaque over the left anterior descending coronary artery. Thoracic aorta is normal. Pulmonary arterial system is well opacified without evidence of emboli. Aberrant right subclavian artery. Remaining vascular structures are unremarkable. Mediastinum/Nodes: No significant mediastinal or  hilar adenopathy. Lungs/Pleura: Lungs are adequately inflated demonstrate moderate consolidation over the lower lobes bilaterally likely atelectasis. Mild atelectatic change over the right middle lobe and lingula. Subtle dependent atelectasis over the posterior right upper lobe. No significant effusion. Dependent debris over the distal trachea and right mainstem bronchus likely aspirate material. Obstructed right posterior lower lobe bronchus which may be due to mucous plugging versus aspiration. Narrowed right lower lobe bronchi. Upper Abdomen: No acute findings.  Musculoskeletal: Degenerative change of the spine. Review of the MIP images confirms the above findings. IMPRESSION: 1.  No evidence of pulmonary embolism. 2. Significant bilateral lower lobe consolidation likely atelectasis. Mild atelectasis over the right middle lobe and lingula as well as minimal dependent atelectasis over the right upper lobe. No effusion. Aspirate material over the distal trachea and right mainstem bronchus. 3. Cardiomegaly with minimal atherosclerotic coronary artery disease. Electronically Signed   By: Marin Olp M.D.   On: 01/17/2019 10:12   Ct Abdomen Pelvis W Contrast  Result Date: 01/13/2019 CLINICAL DATA:  Right-sided abdominal pain and nausea and vomiting beginning last night. Prior cholecystectomy. EXAM: CT ABDOMEN AND PELVIS WITH CONTRAST TECHNIQUE: Multidetector CT imaging of the abdomen and pelvis was performed using the standard protocol following bolus administration of intravenous contrast. CONTRAST:  156mL OMNIPAQUE IOHEXOL 300 MG/ML  SOLN COMPARISON:  05/14/2012 FINDINGS: Lower Chest: Increased linear opacity in both lung bases since 2014, which may be due to subsegmental atelectasis or scarring. Hepatobiliary: No hepatic masses identified. Prior cholecystectomy. No evidence of biliary obstruction. Pancreas:  No mass or inflammatory changes. Spleen: Within normal limits in size and appearance. Adrenals/Urinary Tract: No masses identified. Several small left renal cysts again noted. No evidence of hydronephrosis. Stomach/Bowel: Large paraumbilical ventral hernia is again seen containing multiple small bowel loops. Moderate dilatation of small bowel is seen within the hernia and proximal to the hernia, consistent with small-bowel obstruction. No evidence of small bowel wall thickening or pneumatosis. No evidence of inflammatory process or abnormal fluid collections. Diverticulosis is seen mainly involving the descending and sigmoid colon, however there is no evidence of  diverticulitis. Vascular/Lymphatic: No pathologically enlarged lymph nodes. No abdominal aortic aneurysm. Aortic atherosclerosis. Reproductive:  No mass or other significant abnormality. Other:  None. Musculoskeletal:  No suspicious bone lesions identified. IMPRESSION: 1. Small bowel obstruction due to large paraumbilical ventral hernia, which contains multiple small bowel loops. No signs of bowel strangulation. 2. Colonic diverticulosis, without radiographic evidence of diverticulitis. Aortic Atherosclerosis (ICD10-I70.0). Electronically Signed   By: Marlaine Hind M.D.   On: 01/13/2019 18:50   Dg Chest Port 1 View  Result Date: 01/24/2019 CLINICAL DATA:  Shortness of breath and pneumonia. EXAM: PORTABLE CHEST 1 VIEW COMPARISON:  01/22/2019 FINDINGS: Cardiac silhouette is stable. There are persistent bibasilar airspace opacities and possible small bilateral pleural effusions. No pneumothorax. No acute osseous abnormality. IMPRESSION: No significant interval change. Electronically Signed   By: Constance Holster M.D.   On: 01/24/2019 11:15   Dg Chest Port 1 View  Result Date: 01/22/2019 CLINICAL DATA:  Acute respiratory failure with hypoxia, history COPD, hypertension EXAM: PORTABLE CHEST 1 VIEW COMPARISON:  Portable exam 0941 hours compared to 01/20/2019 FINDINGS: Enlargement of cardiac silhouette. BILATERAL pulmonary infiltrates in the mid to lower lungs. Associated small RIGHT pleural effusion and basilar atelectasis. Lung apices clear. No pneumothorax. IMPRESSION: Bibasilar infiltrates slightly increased from previous exam with associated small RIGHT pleural effusion and mild RIGHT basilar atelectasis. Electronically Signed   By: Crist Infante.D.  On: 01/22/2019 10:03   Dg Chest Port 1 View  Result Date: 01/20/2019 CLINICAL DATA:  Hypoxia. EXAM: PORTABLE CHEST 1 VIEW COMPARISON:  Radiograph of January 17, 2019. FINDINGS: Stable cardiomegaly. No pneumothorax is noted. Stable bibasilar atelectasis or  infiltrates are noted with possible small pleural effusions. Bony thorax is unremarkable. IMPRESSION: Stable bibasilar opacities and small effusions as described above Electronically Signed   By: Marijo Conception M.D.   On: 01/20/2019 15:01   Dg Chest Port 1 View  Result Date: 01/17/2019 CLINICAL DATA:  Acute respiratory failure. EXAM: PORTABLE CHEST 1 VIEW COMPARISON:  Radiograph of January 16, 2019. FINDINGS: Stable cardiomediastinal silhouette. No pneumothorax is noted. Stable bibasilar atelectasis or infiltrates are noted. Small bilateral pleural effusions are noted as well. Bony thorax unremarkable. IMPRESSION: Stable bibasilar opacities as described above. Electronically Signed   By: Marijo Conception M.D.   On: 01/17/2019 07:36   Dg Chest Port 1 View  Result Date: 01/16/2019 CLINICAL DATA:  Hypoxia today. EXAM: PORTABLE CHEST 1 VIEW COMPARISON:  Single-view of the chest 01/13/2019. PA and lateral chest 09/25/2010. CT chest 10/03/2010. FINDINGS: NG tube has been removed. There is cardiomegaly and interstitial edema. Right pleural effusion and basilar airspace disease have increased. Left basilar airspace disease is mildly improved. No pneumothorax. IMPRESSION: No marked change in cardiomegaly and interstitial edema. Increased small right pleural effusion and basilar atelectasis. Left pleural effusion is slightly decreased. Electronically Signed   By: Inge Rise M.D.   On: 01/16/2019 09:34   Dg Chest Portable 1 View  Result Date: 01/13/2019 CLINICAL DATA:  NG tube placement EXAM: PORTABLE CHEST 1 VIEW COMPARISON:  Radiograph 01/13/2019 FINDINGS: Interval placement of a transesophageal tube with tip and side port distal to the GE junction, curling within the gastric lumen in the left upper quadrant. Redemonstration of the diffuse interstitial opacities and moderate cardiomegaly. No pneumothorax. No new consolidative process. No acute osseous or soft tissue abnormality. Degenerative changes are  present in the imaged spine and shoulders. IMPRESSION: 1. Appropriate positioning of the transesophageal tube with the tip and side port distal to the GE junction 2. Slightly diminished lung volumes with persistent interstitial pulmonary edema and cardiomegaly. Electronically Signed   By: Lovena Le M.D.   On: 01/13/2019 20:15   Dg Chest Portable 1 View  Result Date: 01/13/2019 CLINICAL DATA:  Cough. Nausea and vomiting. Right-sided abdominal pain. EXAM: PORTABLE CHEST 1 VIEW COMPARISON:  09/25/2010 FINDINGS: Mild-to-moderate cardiomegaly. Diffuse interstitial infiltrates, suspicious for interstitial edema. Stable linear opacity in right lower lung, consistent with scarring. No evidence of pulmonary consolidation or pleural effusion. IMPRESSION: Cardiomegaly and diffuse interstitial infiltrates, suspicious for mild congestive heart failure. Electronically Signed   By: Marlaine Hind M.D.   On: 01/13/2019 16:41    Orson Eva, DO  [facility] Pager [phone]  If 7PM-7AM, please contact night-coverage [email or web address] Password [email or web address] 01/28/2019, 4:45 PM   LOS: 14 days

## 2019-01-28 NOTE — Progress Notes (Signed)
Subjective: She says she feels about the same.  She remains hypoxic.  Objective: Vital signs in last 24 hours: Temp:  [97.6 F (36.4 C)-98.8 F (37.1 C)] 98.1 F (36.7 C) (09/29 0400) Pulse Rate:  [43-77] 46 (09/29 0600) Resp:  [15-28] 18 (09/29 0600) BP: (106-161)/(50-95) 108/50 (09/29 0600) SpO2:  [83 %-98 %] 94 % (09/29 0600) FiO2 (%):  [20 %-70 %] 70 % (09/28 2358) Weight change:  Last BM Date: 01/26/19  Intake/Output from previous day: 09/28 0701 - 09/29 0700 In: 6 [I.V.:6] Out: 825 [Urine:825]  PHYSICAL EXAM General appearance: alert, cooperative and no distress Resp: rhonchi bilaterally Cardio: regular rate and rhythm, S1, S2 normal, no murmur, click, rub or gallop GI: soft, non-tender; bowel sounds normal; no masses,  no organomegaly Extremities: extremities normal, atraumatic, no cyanosis or edema  Lab Results:  Results for orders placed or performed during the hospital encounter of 01/13/19 (from the past 48 hour(s))  Basic metabolic panel     Status: Abnormal   Collection Time: 01/28/19  4:11 AM  Result Value Ref Range   Sodium 141 135 - 145 mmol/L   Potassium 4.2 3.5 - 5.1 mmol/L   Chloride 104 98 - 111 mmol/L   CO2 28 22 - 32 mmol/L   Glucose, Bld 97 70 - 99 mg/dL   BUN 38 (H) 6 - 20 mg/dL   Creatinine, Ser 1.12 (H) 0.44 - 1.00 mg/dL   Calcium 8.5 (L) 8.9 - 10.3 mg/dL   GFR calc non Af Amer 54 (L) >60 mL/min   GFR calc Af Amer >60 >60 mL/min   Anion gap 9 5 - 15    Comment: Performed at [facility], [address], [place], [place] [address]  CBC     Status: Abnormal   Collection Time: 01/28/19  4:11 AM  Result Value Ref Range   WBC 16.6 (H) 4.0 - 10.5 K/uL   RBC 4.46 3.87 - 5.11 MIL/uL   Hemoglobin 14.3 12.0 - 15.0 g/dL   HCT 45.2 36.0 - 46.0 %   MCV 101.3 (H) 80.0 - 100.0 fL   MCH 32.1 26.0 - 34.0 pg   MCHC 31.6 30.0 - 36.0 g/dL   RDW 14.2 11.5 - 15.5 %   Platelets 249 150 - 400 K/uL   nRBC 0.0 0.0 - 0.2 %    Comment: Performed at [facility], [address] [place], [place] [address]    ABGS No results for input(s): PHART, PO2ART, TCO2, HCO3 in the last 72 hours.  Invalid input(s): PCO2 CULTURES No results found for this or any previous visit (from the past 240 hour(s)). Studies/Results: No results found.  Medications:  Prior to Admission:  Medications Prior to Admission  Medication Sig Dispense Refill Last Dose  . albuterol (PROVENTIL) (2.5 MG/3ML) 0.083% nebulizer solution Take 2.5 mg by nebulization every 6 (six) hours as needed for wheezing or shortness of breath.   unknown  . albuterol (VENTOLIN HFA) 108 (90 Base) MCG/ACT inhaler Inhale 1-2 puffs into the lungs every 6 (six) hours as needed for wheezing or shortness of breath.   unknown  . cyclobenzaprine (FLEXERIL) 10 MG tablet Take 10 mg by mouth daily as needed for muscle spasms.   Past Month at Unknown time  . diphenhydrAMINE HCl (ALLERGY MEDICATION PO) Take 1 tablet by mouth at bedtime.   01/12/2019 at Unknown time  . gabapentin (NEURONTIN) 300 MG capsule Take 600 mg by mouth at bedtime.    01/12/2019 at Unknown time  .  ibuprofen (ADVIL) 200 MG tablet Take 400 mg by mouth at bedtime.   01/12/2019 at Unknown time  . lisinopril-hydrochlorothiazide (ZESTORETIC) 10-12.5 MG tablet Take 1 tablet by mouth every morning.   01/13/2019 at Unknown time   Scheduled: . acetylcysteine  1 mL Nebulization BID  . arformoterol  15 mcg Nebulization BID  . budesonide (PULMICORT) nebulizer solution  0.5 mg Nebulization BID  . Chlorhexidine Gluconate Cloth  6 each Topical Daily  . enoxaparin (LOVENOX) injection  40 mg Subcutaneous Q24H  . folic acid  1 mg Oral Daily  . guaiFENesin  1,200 mg Oral BID  . ipratropium-albuterol  3 mL Nebulization Q4H  . lisinopril  20 mg Oral Daily  . magnesium hydroxide  30 mL Oral BID  . mouth rinse  15 mL Mouth Rinse BID  . methylPREDNISolone (SOLU-MEDROL) injection  40 mg Intravenous Q12H  . saccharomyces boulardii  250 mg Oral BID  . sodium  chloride flush  3 mL Intravenous Q12H  . vitamin B-12  500 mcg Oral Daily   Continuous: . sodium chloride     OQH:UTMLYY chloride, acetaminophen **OR** acetaminophen, diphenhydrAMINE **OR** diphenhydrAMINE, diphenhydrAMINE-zinc acetate, fentaNYL (SUBLIMAZE) injection, guaiFENesin-dextromethorphan, ipratropium-albuterol, labetalol, LORazepam, ondansetron **OR** ondansetron (ZOFRAN) IV, polyethylene glycol, polyvinyl alcohol, simethicone, sodium chloride flush, traMADol  Assesment: She was admitted with small bowel obstruction.  She had a large incisional hernia and had small bowel obstruction from that.  She will underwent surgery had hernia repair omentectomy and mesh.  She is done well postop.  She has persistent hypoxia after her surgery and is requiring right now 70% oxygen but has better O2 sat.  I think she has chronic hypoxia and if we can get her to approximately 86% I think that is going to be our goal although she may improve some as an outpatient.  She has aspiration pneumonia which is been treated.  She has COPD exacerbation. Principal Problem:   Small bowel obstruction (HCC) Active Problems:   COPD (chronic obstructive pulmonary disease) (HCC)   Hypertension   Acute respiratory failure with hypoxia (HCC)   Polycythemia   COPD with acute exacerbation (HCC)   Incisional hernia, without obstruction or gangrene   Aspiration pneumonitis ([place])    Plan: Continue treatments.  Continue oxygen.  Continue to try to wean oxygen as possible CT chest without contrast.  CTA done on the 18th showed debris in the lungs suggesting aspiration and 1 segmental bronchus appeared to be occluded I would like to see if she has more obstructed airways that might create a shunt    LOS: 14 days   Veronica Horton 01/28/2019, 7:28 AM

## 2019-01-29 DIAGNOSIS — I5032 Chronic diastolic (congestive) heart failure: Secondary | ICD-10-CM

## 2019-01-29 LAB — BASIC METABOLIC PANEL
Anion gap: 11 (ref 5–15)
BUN: 38 mg/dL — ABNORMAL HIGH (ref 6–20)
CO2: 28 mmol/L (ref 22–32)
Calcium: 8.3 mg/dL — ABNORMAL LOW (ref 8.9–10.3)
Chloride: 101 mmol/L (ref 98–111)
Creatinine, Ser: 1.23 mg/dL — ABNORMAL HIGH (ref 0.44–1.00)
GFR calc Af Amer: 56 mL/min — ABNORMAL LOW (ref >60)
GFR calc non Af Amer: 48 mL/min — ABNORMAL LOW (ref >60)
Glucose, Bld: 96 mg/dL (ref 70–99)
Potassium: 4 mmol/L (ref 3.5–5.1)
Sodium: 140 mmol/L (ref 135–145)

## 2019-01-29 MED ORDER — METHYLPREDNISOLONE SODIUM SUCC 125 MG IJ SOLR
60.0000 mg | INTRAMUSCULAR | Status: DC
  Administered 2019-01-30: 60 mg via INTRAVENOUS
  Filled 2019-01-29: qty 2

## 2019-01-29 MED ORDER — FUROSEMIDE 10 MG/ML IJ SOLN
40.0000 mg | Freq: Two times a day (BID) | INTRAMUSCULAR | Status: AC
  Administered 2019-01-29 – 2019-01-30 (×2): 40 mg via INTRAVENOUS
  Filled 2019-01-29 (×2): qty 4

## 2019-01-29 NOTE — Progress Notes (Signed)
PROGRESS NOTE  Veronica SANAGUSTIN WER:[id number] DOB: 1961/04/20 DOA: 01/13/2019 PCP: Sharilyn Sites, MD  Brief History: 58 y/o female with history of COPD, HTN, tobacco abuse, morbid obesity presenting with abdominal pain with nausea and vomiting that started evening 01/12/19 and progressively worsened throughout the day on 01/13/19. She also has had increasing cough with clear sputum x 1-2 days. She denies f/c, cp, hemoptysis, dysuria, hematuria, hematochezia, melena. She had some dyspnea on exertion, but stated it was not any worse than usual. Her last BM was on 01/12/19. She denies any new medications, but continues to smoke 1ppd.  In the ED, she was afebrile and hemodynamically stable. BMP, LFTs and CBC were essentially unremarkable except some polycythemia and WBC 11.3. The patient was afebrile hemodynamically stable but was hypoxic with oxygen saturation 87% on room air. CT of abd/pelvis showed SBO secondary to a large paraumbilical ventral hernia without strangulation. NG was placed for decompression. General surgery was consulted to assist.Postop, the patient had persistent hypoxemia. CTA chest was negative for PE. Pulmonary medicine was consulted to assist with management. Her hospital stay has been prolonged due to persisted hypoxemia.  Assessment/Plan: SBO -NG tube placed for decompressioninitially -01/15/19--incisional herniorrhaphy with mesh and omentectomy -having BMs and tolerating diet post-op -Optimize electrolytes  Acute respiratory failure with hypoxia -Secondary to COPD exacerbation with a component of aspiration pneumonitis -finished abx for aspiration PNA. -appreciate pulmonary consult and recommendations. -repeat PCT <0.10 -01/17/19 CTA chest neg for PE but showed dependent debris in distal trachea and obstructed RLL bronchus with mucus plug -repeat CXR--personally reviewed--bibasilar opacitiesunchanged -BNP--66 -Stable O2 sat onHFNC -Wean oxygen  for saturation greater88% -continue Brovana, Mucomyst and Flutter valve. -suspect persistent hypoxia due to atelectasis and mucus plugging -continue IV lasix X2 doses -increase activity  COPD exacerbation -ContinuePulmicort -Continueduo nebs and IV steroids, will initiate tapering. -Continue brovana and mucomyst -started on flutter valve  Essential hypertension -Continue lisinopril -Blood pressure stable currently. -Hydralazine as neededSBP>180  Peripheral neuropathy -Serum P95--093 -Folic OIZT--2.4 -supplement B12 and folate  Hypokalemia -repleted -mag--2.6 -Continue monitoring and providing further repletion as needed.  Morbid obesity -Body mass index is 49.67 kg/m. -Low calorie diet, portion control and increase physical activity discussed with patient -Will benefit of outpatient sleep study.    Subjective: No fever, no chest pain, no nausea, no vomiting.  Reports some improvement in her breathing.  Still requiring high flow nasal cannula supplementation.  Objective: Vitals:   01/29/19 1108 01/29/19 1200 01/29/19 1219 01/29/19 1400  BP:  134/60  (!) 115/55  Pulse: 74 85  68  Resp: 15 15  15   Temp: 98.4 F (36.9 C)     TempSrc: Oral     SpO2: 95% (!) 89% 92% (!) 86%  Weight:      Height:        Intake/Output Summary (Last 24 hours) at 01/29/2019 1513 Last data filed at 01/29/2019 0913 Gross per 24 hour  Intake 3 ml  Output 1150 ml  Net -1147 ml   Weight change:  Exam:  General exam: Alert, awake, oriented x 3; afebrile, denies chest pain, no nausea or vomiting.  Reports no abdominal pain and is tolerating diet without any difficulties.  Continues to require high flow nasal cannula supplementation. Respiratory system: Positive rhonchi, fair air movement bilaterally, no active wheezing at this time.  Positive tachypnea with minimal exertion. Cardiovascular system: RRR. No murmurs, rubs, gallops. Gastrointestinal system: Abdomen is obese,  nondistended, soft  and nontender. No organomegaly or masses felt. Normal bowel sounds heard. Central nervous system: Alert and oriented. No focal neurological deficits. Extremities: No cyanosis or clubbing; 1+ lower extremity edema bilaterally. Skin: No rashes, no petechiae. Psychiatry: Judgement and insight appear normal. Mood & affect appropriate.    Data Reviewed: I have personally reviewed following labs and imaging studies  Basic Metabolic Panel: Recent Labs  Lab 01/24/19 0417 01/25/19 0426 01/26/19 0513 01/28/19 0411 01/29/19 0406  NA 139 138 138 141 140  K 4.5 4.2 4.7 4.2 4.0  CL 105 103 103 104 101  CO2 26 28 27 28 28   GLUCOSE 144* 140* 142* 97 96  BUN 26* 33* 33* 38* 38*  CREATININE 1.01* 0.96 1.14* 1.12* 1.23*  CALCIUM 8.7* 8.6* 8.5* 8.5* 8.3*  MG 2.6* 2.6* 2.7*  --   --   PHOS  --   --  4.2  --   --    Liver Function Tests: Recent Labs  Lab 01/25/19 0426  AST 14*  ALT 23  ALKPHOS 55  BILITOT 0.6  PROT 6.6  ALBUMIN 3.2*   CBC: Recent Labs  Lab 01/24/19 0417 01/25/19 0426 01/28/19 0411  WBC 16.9* 17.4* 16.6*  NEUTROABS  --  15.5*  --   HGB 14.2 14.0 14.3  HCT 44.4 44.5 45.2  MCV 101.1* 101.4* 101.3*  PLT 270 288 249   Urine analysis:    Component Value Date/Time   COLORURINE YELLOW 01/13/2019 1730   APPEARANCEUR CLOUDY (A) 01/13/2019 1730   LABSPEC 1.017 01/13/2019 1730   PHURINE 8.0 01/13/2019 1730   GLUCOSEU NEGATIVE 01/13/2019 1730   HGBUR NEGATIVE 01/13/2019 1730   BILIRUBINUR NEGATIVE 01/13/2019 1730   KETONESUR NEGATIVE 01/13/2019 1730   PROTEINUR 100 (A) 01/13/2019 1730   UROBILINOGEN 0.2 09/25/2010 0530   NITRITE NEGATIVE 01/13/2019 1730   LEUKOCYTESUR NEGATIVE 01/13/2019 1730   Scheduled Meds: . acetylcysteine  1 mL Nebulization BID  . arformoterol  15 mcg Nebulization BID  . budesonide (PULMICORT) nebulizer solution  0.5 mg Nebulization BID  . Chlorhexidine Gluconate Cloth  6 each Topical Daily  . enoxaparin (LOVENOX)  injection  70 mg Subcutaneous Q24H  . folic acid  1 mg Oral Daily  . guaiFENesin  1,200 mg Oral BID  . ipratropium-albuterol  3 mL Nebulization Q4H  . lisinopril  20 mg Oral Daily  . magnesium hydroxide  30 mL Oral BID  . mouth rinse  15 mL Mouth Rinse BID  . methylPREDNISolone (SOLU-MEDROL) injection  40 mg Intravenous Q12H  . saccharomyces boulardii  250 mg Oral BID  . sodium chloride flush  3 mL Intravenous Q12H  . vitamin B-12  500 mcg Oral Daily   Continuous Infusions: . sodium chloride      Procedures/Studies: Ct Chest Wo Contrast  Result Date: 01/28/2019 CLINICAL DATA:  Difficulty breathing.  COPD exacerbation. EXAM: CT CHEST WITHOUT CONTRAST TECHNIQUE: Multidetector CT imaging of the chest was performed following the standard protocol without IV contrast. COMPARISON:  CT scan of January 17, 2019. FINDINGS: Cardiovascular: There is no evidence of thoracic aortic aneurysm. Mild cardiomegaly is noted. No pericardial effusion is noted. Mild coronary artery calcifications are noted. Mediastinum/Nodes: No enlarged mediastinal or axillary lymph nodes. Thyroid gland, trachea, and esophagus demonstrate no significant findings. Lungs/Pleura: No pneumothorax is noted. Continued bilateral lower lobe airspace opacities are noted with air bronchograms concerning for atelectasis or possibly infiltrates, with small associated pleural effusions. Right upper lobe subsegmental atelectasis is noted. Upper Abdomen: No acute abnormality.  Musculoskeletal: No chest wall mass or suspicious bone lesions identified. IMPRESSION: Bilateral lower lobe airspace opacities are again noted, concerning for atelectasis or possibly infiltrates, with small associated bilateral pleural effusions. Right upper lobe subsegmental atelectasis is noted. Mild coronary artery calcifications are noted. Electronically Signed   By: Marijo Conception M.D.   On: 01/28/2019 17:05   Ct Angio Chest Pe W Or Wo Contrast  Result Date:  01/17/2019 CLINICAL DATA:  Dyspnea chronically. Two days postop small-bowel obstruction and hernia repair. EXAM: CT ANGIOGRAPHY CHEST WITH CONTRAST TECHNIQUE: Multidetector CT imaging of the chest was performed using the standard protocol during bolus administration of intravenous contrast. Multiplanar CT image reconstructions and MIPs were obtained to evaluate the vascular anatomy. CONTRAST:  172mL OMNIPAQUE IOHEXOL 350 MG/ML SOLN COMPARISON:  Chest CT 10/03/2010 and abdominal CT 01/13/2019 FINDINGS: Cardiovascular: Mild cardiomegaly. Minimal calcified plaque over the left anterior descending coronary artery. Thoracic aorta is normal. Pulmonary arterial system is well opacified without evidence of emboli. Aberrant right subclavian artery. Remaining vascular structures are unremarkable. Mediastinum/Nodes: No significant mediastinal or hilar adenopathy. Lungs/Pleura: Lungs are adequately inflated demonstrate moderate consolidation over the lower lobes bilaterally likely atelectasis. Mild atelectatic change over the right middle lobe and lingula. Subtle dependent atelectasis over the posterior right upper lobe. No significant effusion. Dependent debris over the distal trachea and right mainstem bronchus likely aspirate material. Obstructed right posterior lower lobe bronchus which may be due to mucous plugging versus aspiration. Narrowed right lower lobe bronchi. Upper Abdomen: No acute findings. Musculoskeletal: Degenerative change of the spine. Review of the MIP images confirms the above findings. IMPRESSION: 1.  No evidence of pulmonary embolism. 2. Significant bilateral lower lobe consolidation likely atelectasis. Mild atelectasis over the right middle lobe and lingula as well as minimal dependent atelectasis over the right upper lobe. No effusion. Aspirate material over the distal trachea and right mainstem bronchus. 3. Cardiomegaly with minimal atherosclerotic coronary artery disease. Electronically Signed   By:  Marin Olp M.D.   On: 01/17/2019 10:12   Ct Abdomen Pelvis W Contrast  Result Date: 01/13/2019 CLINICAL DATA:  Right-sided abdominal pain and nausea and vomiting beginning last night. Prior cholecystectomy. EXAM: CT ABDOMEN AND PELVIS WITH CONTRAST TECHNIQUE: Multidetector CT imaging of the abdomen and pelvis was performed using the standard protocol following bolus administration of intravenous contrast. CONTRAST:  189mL OMNIPAQUE IOHEXOL 300 MG/ML  SOLN COMPARISON:  05/14/2012 FINDINGS: Lower Chest: Increased linear opacity in both lung bases since 2014, which may be due to subsegmental atelectasis or scarring. Hepatobiliary: No hepatic masses identified. Prior cholecystectomy. No evidence of biliary obstruction. Pancreas:  No mass or inflammatory changes. Spleen: Within normal limits in size and appearance. Adrenals/Urinary Tract: No masses identified. Several small left renal cysts again noted. No evidence of hydronephrosis. Stomach/Bowel: Large paraumbilical ventral hernia is again seen containing multiple small bowel loops. Moderate dilatation of small bowel is seen within the hernia and proximal to the hernia, consistent with small-bowel obstruction. No evidence of small bowel wall thickening or pneumatosis. No evidence of inflammatory process or abnormal fluid collections. Diverticulosis is seen mainly involving the descending and sigmoid colon, however there is no evidence of diverticulitis. Vascular/Lymphatic: No pathologically enlarged lymph nodes. No abdominal aortic aneurysm. Aortic atherosclerosis. Reproductive:  No mass or other significant abnormality. Other:  None. Musculoskeletal:  No suspicious bone lesions identified. IMPRESSION: 1. Small bowel obstruction due to large paraumbilical ventral hernia, which contains multiple small bowel loops. No signs of bowel strangulation. 2. Colonic diverticulosis, without  radiographic evidence of diverticulitis. Aortic Atherosclerosis (ICD10-I70.0).  Electronically Signed   By: Marlaine Hind M.D.   On: 01/13/2019 18:50   Dg Chest Port 1 View  Result Date: 01/24/2019 CLINICAL DATA:  Shortness of breath and pneumonia. EXAM: PORTABLE CHEST 1 VIEW COMPARISON:  01/22/2019 FINDINGS: Cardiac silhouette is stable. There are persistent bibasilar airspace opacities and possible small bilateral pleural effusions. No pneumothorax. No acute osseous abnormality. IMPRESSION: No significant interval change. Electronically Signed   By: Constance Holster M.D.   On: 01/24/2019 11:15   Dg Chest Port 1 View  Result Date: 01/22/2019 CLINICAL DATA:  Acute respiratory failure with hypoxia, history COPD, hypertension EXAM: PORTABLE CHEST 1 VIEW COMPARISON:  Portable exam 0941 hours compared to 01/20/2019 FINDINGS: Enlargement of cardiac silhouette. BILATERAL pulmonary infiltrates in the mid to lower lungs. Associated small RIGHT pleural effusion and basilar atelectasis. Lung apices clear. No pneumothorax. IMPRESSION: Bibasilar infiltrates slightly increased from previous exam with associated small RIGHT pleural effusion and mild RIGHT basilar atelectasis. Electronically Signed   By: Lavonia Dana M.D.   On: 01/22/2019 10:03   Dg Chest Port 1 View  Result Date: 01/20/2019 CLINICAL DATA:  Hypoxia. EXAM: PORTABLE CHEST 1 VIEW COMPARISON:  Radiograph of January 17, 2019. FINDINGS: Stable cardiomegaly. No pneumothorax is noted. Stable bibasilar atelectasis or infiltrates are noted with possible small pleural effusions. Bony thorax is unremarkable. IMPRESSION: Stable bibasilar opacities and small effusions as described above Electronically Signed   By: Marijo Conception M.D.   On: 01/20/2019 15:01   Dg Chest Port 1 View  Result Date: 01/17/2019 CLINICAL DATA:  Acute respiratory failure. EXAM: PORTABLE CHEST 1 VIEW COMPARISON:  Radiograph of January 16, 2019. FINDINGS: Stable cardiomediastinal silhouette. No pneumothorax is noted. Stable bibasilar atelectasis or infiltrates are  noted. Small bilateral pleural effusions are noted as well. Bony thorax unremarkable. IMPRESSION: Stable bibasilar opacities as described above. Electronically Signed   By: Marijo Conception M.D.   On: 01/17/2019 07:36   Dg Chest Port 1 View  Result Date: 01/16/2019 CLINICAL DATA:  Hypoxia today. EXAM: PORTABLE CHEST 1 VIEW COMPARISON:  Single-view of the chest 01/13/2019. PA and lateral chest 09/25/2010. CT chest 10/03/2010. FINDINGS: NG tube has been removed. There is cardiomegaly and interstitial edema. Right pleural effusion and basilar airspace disease have increased. Left basilar airspace disease is mildly improved. No pneumothorax. IMPRESSION: No marked change in cardiomegaly and interstitial edema. Increased small right pleural effusion and basilar atelectasis. Left pleural effusion is slightly decreased. Electronically Signed   By: Inge Rise M.D.   On: 01/16/2019 09:34   Dg Chest Portable 1 View  Result Date: 01/13/2019 CLINICAL DATA:  NG tube placement EXAM: PORTABLE CHEST 1 VIEW COMPARISON:  Radiograph 01/13/2019 FINDINGS: Interval placement of a transesophageal tube with tip and side port distal to the GE junction, curling within the gastric lumen in the left upper quadrant. Redemonstration of the diffuse interstitial opacities and moderate cardiomegaly. No pneumothorax. No new consolidative process. No acute osseous or soft tissue abnormality. Degenerative changes are present in the imaged spine and shoulders. IMPRESSION: 1. Appropriate positioning of the transesophageal tube with the tip and side port distal to the GE junction 2. Slightly diminished lung volumes with persistent interstitial pulmonary edema and cardiomegaly. Electronically Signed   By: Lovena Le M.D.   On: 01/13/2019 20:15   Dg Chest Portable 1 View  Result Date: 01/13/2019 CLINICAL DATA:  Cough. Nausea and vomiting. Right-sided abdominal pain. EXAM: PORTABLE CHEST 1 VIEW COMPARISON:  09/25/2010 FINDINGS:  Mild-to-moderate cardiomegaly. Diffuse interstitial infiltrates, suspicious for interstitial edema. Stable linear opacity in right lower lung, consistent with scarring. No evidence of pulmonary consolidation or pleural effusion. IMPRESSION: Cardiomegaly and diffuse interstitial infiltrates, suspicious for mild congestive heart failure. Electronically Signed   By: Marlaine Hind M.D.   On: 01/13/2019 16:41    [name], MD  [facility] Pager [phone]  01/29/2019, 3:13 PM   LOS: 15 days

## 2019-01-29 NOTE — Progress Notes (Signed)
Pt already has a flutter

## 2019-01-29 NOTE — Progress Notes (Signed)
Physical Therapy Treatment Patient Details Name: Veronica Horton MRN: [id number] DOB: Jul 14, 1960 Today's Date: 01/29/2019    History of Present Illness Veronica Horton is a 58 y.o. female with medical history significant for COPD, hypertension, hiatal hernia, and history of cholecystectomy, now presenting to the emergency department for evaluation of abdominal pain with nausea and vomiting.  Patient reports that she been in her usual state of health until last night when she developed abdominal pain with nausea and vomiting.  Pain and nausea/vomiting persisted throughout the night and through today, not necessarily worsening, but not improving either.  She also had a marked increase in her chronic cough last night accompanied by thick sputum production.  She does not feel particularly short of breath.  She has not noted any lower extremity swelling or tenderness.  She denies any fevers or chills.  She has not had these symptoms previously.  She denies diarrhea, melena, or hematochezia.    PT Comments    Pt sitting in chair and willing to participate.  Pt with big grin, proud to share she was able to walk to the doorway yesterday with RN for procedure.  Monitored O2 saturation through session, pt on 10L O2 A and SpO2 range from 87-91% during gait and therex.  Supervision/min guard during gait training with no LOB, used RW for balance.  No reports of pain through session, was fatigued following therex.  EOS pt left in chair iwht call bell within reach and family member in room.  RN aware of status and O2 saturation.    Follow Up Recommendations  Home health PT;Supervision - Intermittent     Equipment Recommendations  3in1 (PT)    Recommendations for Other Services       Precautions / Restrictions Precautions Precautions: Fall Restrictions Weight Bearing Restrictions: No    Mobility  Bed Mobility                  Transfers Overall transfer level: Modified independent Equipment  used: Rolling walker (2 wheeled) Transfers: Sit to/from Stand Sit to Stand: Supervision;Min guard         General transfer comment: Pt sitting in chair upon entrance.  Good mechanics with sit to stand.  Minimal cueing required for pursed lip breathing  Ambulation/Gait Ambulation/Gait assistance: Supervision Gait Distance (Feet): 24 Feet(Forward/backward x 4RT) Assistive device: Rolling walker (2 wheeled) Gait Pattern/deviations: Decreased step length - right;Decreased step length - left;Decreased stride length Gait velocity: slow   General Gait Details: Increased distance, able to make it by side of footboard bed; forward/backward gait with RW on 10LPM O2 with SpO2 between 87-91% during gait   Stairs             Wheelchair Mobility    Modified Rankin (Stroke Patients Only)       Balance                                            Cognition Arousal/Alertness: Awake/alert Behavior During Therapy: WFL for tasks assessed/performed Overall Cognitive Status: Within Functional Limits for tasks assessed                                        Exercises General Exercises - Lower Extremity Ankle Circles/Pumps: AROM;20 reps;Both Long Arc Quad: AROM;Both;20 reps  Hip ABduction/ADduction: AROM;Both;10 reps Hip Flexion/Marching: AROM;Both;20 reps Toe Raises: AROM;Both;20 reps Heel Raises: Seated;AROM;Strengthening;Both;20 reps    General Comments        Pertinent Vitals/Pain Pain Assessment: No/denies pain    Home Living                      Prior Function            PT Goals (current goals can now be found in the care plan section)      Frequency    Min 3X/week      PT Plan Current plan remains appropriate    Co-evaluation              AM-PAC PT "6 Clicks" Mobility   Outcome Measure  Help needed turning from your back to your side while in a flat bed without using bedrails?: None Help needed moving  from lying on your back to sitting on the side of a flat bed without using bedrails?: None Help needed moving to and from a bed to a chair (including a wheelchair)?: A Little Help needed standing up from a chair using your arms (e.g., wheelchair or bedside chair)?: A Little Help needed to walk in hospital room?: A Little Help needed climbing 3-5 steps with a railing? : A Lot 6 Click Score: 19    End of Session Equipment Utilized During Treatment: Oxygen Activity Tolerance: Patient tolerated treatment well Patient left: in chair;with call bell/phone within reach Nurse Communication: Mobility status PT Visit Diagnosis: Unsteadiness on feet (R26.81);Other abnormalities of gait and mobility (R26.89);Muscle weakness (generalized) (M62.81)     Time: 5790-3833 PT Time Calculation (min) (ACUTE ONLY): 24 min  Charges:  $Therapeutic Exercise: 8-22 mins $Therapeutic Activity: 8-22 mins                     [address] LPTA; CBIS [phone]  Aldona Lento 01/29/2019, 4:08 PM

## 2019-01-29 NOTE — Progress Notes (Signed)
Pt has an incentive already in her room from previous order. Pt reminded and instructions given again about importance of use

## 2019-01-29 NOTE — Progress Notes (Signed)
14 Days Post-Op  Subjective: No abdominal complaints.  Objective: Vital signs in last 24 hours: Temp:  [97.3 F (36.3 C)-98.6 F (37 C)] 98.1 F (36.7 C) (09/30 0400) Pulse Rate:  [48-77] 59 (09/30 0600) Resp:  [10-20] 14 (09/30 0600) BP: (110-147)/(47-74) 122/73 (09/30 0600) SpO2:  [86 %-100 %] 93 % (09/30 0600) FiO2 (%):  [70 %] 70 % (09/29 1500) Weight:  [135.4 kg] 135.4 kg (09/30 0500) Last BM Date: 01/27/19  Intake/Output from previous day: 09/29 0701 - 09/30 0700 In: 3 [I.V.:3] Out: 1900 [Urine:1900] Intake/Output this shift: No intake/output data recorded.  General appearance: alert, cooperative and no distress GI: soft, non-tender; bowel sounds normal; no masses,  no organomegaly and incision healing well.  Staples removed, steristrips applied.  JP drain removed.  Lab Results:  Recent Labs    01/28/19 0411  WBC 16.6*  HGB 14.3  HCT 45.2  PLT 249   BMET Recent Labs    01/28/19 0411 01/29/19 0406  NA 141 140  K 4.2 4.0  CL 104 101  CO2 28 28  GLUCOSE 97 96  BUN 38* 38*  CREATININE 1.12* 1.23*  CALCIUM 8.5* 8.3*   PT/INR No results for input(s): LABPROT, INR in the last 72 hours.  Studies/Results: Ct Chest Wo Contrast  Result Date: 01/28/2019 CLINICAL DATA:  Difficulty breathing.  COPD exacerbation. EXAM: CT CHEST WITHOUT CONTRAST TECHNIQUE: Multidetector CT imaging of the chest was performed following the standard protocol without IV contrast. COMPARISON:  CT scan of January 17, 2019. FINDINGS: Cardiovascular: There is no evidence of thoracic aortic aneurysm. Mild cardiomegaly is noted. No pericardial effusion is noted. Mild coronary artery calcifications are noted. Mediastinum/Nodes: No enlarged mediastinal or axillary lymph nodes. Thyroid gland, trachea, and esophagus demonstrate no significant findings. Lungs/Pleura: No pneumothorax is noted. Continued bilateral lower lobe airspace opacities are noted with air bronchograms concerning for atelectasis  or possibly infiltrates, with small associated pleural effusions. Right upper lobe subsegmental atelectasis is noted. Upper Abdomen: No acute abnormality. Musculoskeletal: No chest wall mass or suspicious bone lesions identified. IMPRESSION: Bilateral lower lobe airspace opacities are again noted, concerning for atelectasis or possibly infiltrates, with small associated bilateral pleural effusions. Right upper lobe subsegmental atelectasis is noted. Mild coronary artery calcifications are noted. Electronically Signed   By: Marijo Conception M.D.   On: 01/28/2019 17:05    Anti-infectives: Anti-infectives (From admission, onward)   Start     Dose/Rate Route Frequency Ordered Stop   01/17/19 1400  clindamycin (CLEOCIN) IVPB 600 mg     600 mg 100 mL/hr over 30 Minutes Intravenous Every 8 hours 01/17/19 1314 01/20/19 0558   01/17/19 1200  clindamycin (CLEOCIN) IVPB 300 mg  Status:  Discontinued     300 mg 100 mL/hr over 30 Minutes Intravenous Every 6 hours 01/17/19 1159 01/17/19 1159   01/17/19 1200  clindamycin (CLEOCIN) IVPB 300 mg  Status:  Discontinued     300 mg 100 mL/hr over 30 Minutes Intravenous Every 6 hours 01/17/19 1159 01/17/19 1314   01/15/19 0900  vancomycin (VANCOCIN) 1,500 mg in sodium chloride 0.9 % 500 mL IVPB     1,500 mg 250 mL/hr over 120 Minutes Intravenous On call to O.R. 01/15/19 0636 01/15/19 1015   01/15/19 0600  vancomycin (VANCOCIN) 1,500 mg in sodium chloride 0.9 % 500 mL IVPB     1,500 mg 250 mL/hr over 120 Minutes Intravenous On call to O.R. 01/14/19 1941 01/15/19 0837      Assessment/Plan: s/p Procedure(s):  INCISIONAL HERNIORRHAPHY WITH MESH OMENTECTOMY Incision healing well.  Nothing further to add from surgical standpoint.  Will sign off.  Please call if any issues arise.  LOS: 15 days    [name] 01/29/2019

## 2019-01-29 NOTE — Progress Notes (Signed)
Subjective: She says she feels okay.  No complaints.  Her breathing is doing better she thinks.  Oxygen saturations 94% with her lying in bed on 65% oxygen  Objective: Vital signs in last 24 hours: Temp:  [97.3 F (36.3 C)-98.6 F (37 C)] 97.7 F (36.5 C) (09/30 0805) Pulse Rate:  [48-77] 57 (09/30 0805) Resp:  [10-18] 18 (09/30 0805) BP: (110-147)/(47-74) 136/72 (09/30 0800) SpO2:  [86 %-100 %] 92 % (09/30 0805) FiO2 (%):  [70 %] 70 % (09/29 1500) Weight:  [135.4 kg] 135.4 kg (09/30 0500) Weight change:  Last BM Date: 01/27/19  Intake/Output from previous day: 09/29 0701 - 09/30 0700 In: 3 [I.V.:3] Out: 1900 [Urine:1900]  PHYSICAL EXAM General appearance: alert, cooperative, no distress and morbidly obese Resp: clear to auscultation bilaterally Cardio: regular rate and rhythm, S1, S2 normal, no murmur, click, rub or gallop GI: soft, non-tender; bowel sounds normal; no masses,  no organomegaly Extremities: extremities normal, atraumatic, no cyanosis or edema  Lab Results:  Results for orders placed or performed during the hospital encounter of 01/13/19 (from the past 48 hour(s))  Basic metabolic panel     Status: Abnormal   Collection Time: 01/28/19  4:11 AM  Result Value Ref Range   Sodium 141 135 - 145 mmol/L   Potassium 4.2 3.5 - 5.1 mmol/L   Chloride 104 98 - 111 mmol/L   CO2 28 22 - 32 mmol/L   Glucose, Bld 97 70 - 99 mg/dL   BUN 38 (H) 6 - 20 mg/dL   Creatinine, Ser 1.12 (H) 0.44 - 1.00 mg/dL   Calcium 8.5 (L) 8.9 - 10.3 mg/dL   GFR calc non Af Amer 54 (L) >60 mL/min   GFR calc Af Amer >60 >60 mL/min   Anion gap 9 5 - 15    Comment: Performed at [facility], [address], [place], [place] [address]  CBC     Status: Abnormal   Collection Time: 01/28/19  4:11 AM  Result Value Ref Range   WBC 16.6 (H) 4.0 - 10.5 K/uL   RBC 4.46 3.87 - 5.11 MIL/uL   Hemoglobin 14.3 12.0 - 15.0 g/dL   HCT 45.2 36.0 - 46.0 %   MCV 101.3 (H) 80.0 - 100.0 fL   MCH 32.1 26.0 -  34.0 pg   MCHC 31.6 30.0 - 36.0 g/dL   RDW 14.2 11.5 - 15.5 %   Platelets 249 150 - 400 K/uL   nRBC 0.0 0.0 - 0.2 %    Comment: Performed at [name], [address] [place],  [address]  Basic metabolic panel     Status: Abnormal   Collection Time: 01/29/19  4:06 AM  Result Value Ref Range   Sodium 140 135 - 145 mmol/L   Potassium 4.0 3.5 - 5.1 mmol/L   Chloride 101 98 - 111 mmol/L   CO2 28 22 - 32 mmol/L   Glucose, Bld 96 70 - 99 mg/dL   BUN 38 (H) 6 - 20 mg/dL   Creatinine, Ser 1.23 (H) 0.44 - 1.00 mg/dL   Calcium 8.3 (L) 8.9 - 10.3 mg/dL   GFR calc non Af Amer 48 (L) >60 mL/min   GFR calc Af Amer 56 (L) >60 mL/min   Anion gap 11 5 - 15    Comment: Performed at [facility], [address], [place], [place] [address]    ABGS No results for input(s): PHART, PO2ART, TCO2, HCO3 in the last 72 hours.  Invalid input(s):  PCO2 CULTURES No results found for this or any previous visit (from the past 240 hour(s)). Studies/Results: Ct Chest Wo Contrast  Result Date: 01/28/2019 CLINICAL DATA:  Difficulty breathing.  COPD exacerbation. EXAM: CT CHEST WITHOUT CONTRAST TECHNIQUE: Multidetector CT imaging of the chest was performed following the standard protocol without IV contrast. COMPARISON:  CT scan of January 17, 2019. FINDINGS: Cardiovascular: There is no evidence of thoracic aortic aneurysm. Mild cardiomegaly is noted. No pericardial effusion is noted. Mild coronary artery calcifications are noted. Mediastinum/Nodes: No enlarged mediastinal or axillary lymph nodes. Thyroid gland, trachea, and esophagus demonstrate no significant findings. Lungs/Pleura: No pneumothorax is noted. Continued bilateral lower lobe airspace opacities are noted with air bronchograms concerning for atelectasis or possibly infiltrates, with small associated pleural effusions. Right upper lobe subsegmental atelectasis is noted. Upper Abdomen: No acute abnormality. Musculoskeletal: No chest wall mass or  suspicious bone lesions identified. IMPRESSION: Bilateral lower lobe airspace opacities are again noted, concerning for atelectasis or possibly infiltrates, with small associated bilateral pleural effusions. Right upper lobe subsegmental atelectasis is noted. Mild coronary artery calcifications are noted. Electronically Signed   By: Marijo Conception M.D.   On: 01/28/2019 17:05    Medications:  Prior to Admission:  Medications Prior to Admission  Medication Sig Dispense Refill Last Dose  . albuterol (PROVENTIL) (2.5 MG/3ML) 0.083% nebulizer solution Take 2.5 mg by nebulization every 6 (six) hours as needed for wheezing or shortness of breath.   unknown  . albuterol (VENTOLIN HFA) 108 (90 Base) MCG/ACT inhaler Inhale 1-2 puffs into the lungs every 6 (six) hours as needed for wheezing or shortness of breath.   unknown  . cyclobenzaprine (FLEXERIL) 10 MG tablet Take 10 mg by mouth daily as needed for muscle spasms.   Past Month at Unknown time  . diphenhydrAMINE HCl (ALLERGY MEDICATION PO) Take 1 tablet by mouth at bedtime.   01/12/2019 at Unknown time  . gabapentin (NEURONTIN) 300 MG capsule Take 600 mg by mouth at bedtime.    01/12/2019 at Unknown time  . ibuprofen (ADVIL) 200 MG tablet Take 400 mg by mouth at bedtime.   01/12/2019 at Unknown time  . lisinopril-hydrochlorothiazide (ZESTORETIC) 10-12.5 MG tablet Take 1 tablet by mouth every morning.   01/13/2019 at Unknown time   Scheduled: . acetylcysteine  1 mL Nebulization BID  . arformoterol  15 mcg Nebulization BID  . budesonide (PULMICORT) nebulizer solution  0.5 mg Nebulization BID  . Chlorhexidine Gluconate Cloth  6 each Topical Daily  . enoxaparin (LOVENOX) injection  70 mg Subcutaneous Q24H  . folic acid  1 mg Oral Daily  . guaiFENesin  1,200 mg Oral BID  . ipratropium-albuterol  3 mL Nebulization Q4H  . lisinopril  20 mg Oral Daily  . magnesium hydroxide  30 mL Oral BID  . mouth rinse  15 mL Mouth Rinse BID  . methylPREDNISolone  (SOLU-MEDROL) injection  40 mg Intravenous Q12H  . saccharomyces boulardii  250 mg Oral BID  . sodium chloride flush  3 mL Intravenous Q12H  . vitamin B-12  500 mcg Oral Daily   Continuous: . sodium chloride     KKX:FGHWEX chloride, acetaminophen **OR** acetaminophen, diphenhydrAMINE **OR** diphenhydrAMINE, diphenhydrAMINE-zinc acetate, fentaNYL (SUBLIMAZE) injection, guaiFENesin-dextromethorphan, ipratropium-albuterol, labetalol, LORazepam, ondansetron **OR** ondansetron (ZOFRAN) IV, polyethylene glycol, polyvinyl alcohol, simethicone, sodium chloride flush, traMADol  Assesment: She was admitted with small bowel obstruction and underwent surgery for that.  She is done well postop.  She did have aspiration pneumonia during the time that  she was vomiting from her small bowel obstruction.  She has acute on probably chronic hypoxic respiratory failure and she is still requiring high flow nasal cannula.  She had CT yesterday that still shows some infiltrates bilaterally so that may be 1 of the reasons that she is still having trouble with hypoxia.  She has COPD at baseline and is being treated Principal Problem:   Small bowel obstruction ([place]) Active Problems:   COPD (chronic obstructive pulmonary disease) (HCC)   Hypertension   Acute respiratory failure with hypoxia (HCC)   Polycythemia   COPD with acute exacerbation (HCC)   Incisional hernia, without obstruction or gangrene   Aspiration pneumonitis ([place])    Plan: Continue current treatments    LOS: 15 days   Veronica Horton 01/29/2019, 8:46 AM

## 2019-01-30 DIAGNOSIS — J189 Pneumonia, unspecified organism: Secondary | ICD-10-CM

## 2019-01-30 MED ORDER — BUDESONIDE-FORMOTEROL FUMARATE 160-4.5 MCG/ACT IN AERO: 2.0000 | INHALATION_SPRAY | Freq: Two times a day (BID) | RESPIRATORY_TRACT | 12 refills | Status: DC

## 2019-01-30 MED ORDER — FOLIC ACID 1 MG PO TABS: 1.0000 mg | ORAL_TABLET | Freq: Every day | ORAL | 2 refills | Status: DC

## 2019-01-30 MED ORDER — SACCHAROMYCES BOULARDII 250 MG PO CAPS: 250.0000 mg | ORAL_CAPSULE | Freq: Two times a day (BID) | ORAL | 0 refills | Status: DC

## 2019-01-30 MED ORDER — LISINOPRIL 20 MG PO TABS: 20.0000 mg | ORAL_TABLET | Freq: Every day | ORAL | 2 refills | Status: DC

## 2019-01-30 MED ORDER — GUAIFENESIN ER 600 MG PO TB12: 600.0000 mg | ORAL_TABLET | Freq: Two times a day (BID) | ORAL | 0 refills | Status: DC

## 2019-01-30 MED ORDER — PREDNISONE 20 MG PO TABS: ORAL_TABLET | ORAL | 0 refills | Status: DC

## 2019-01-30 MED ORDER — CYANOCOBALAMIN 500 MCG PO TABS: 500.0000 ug | ORAL_TABLET | Freq: Every day | ORAL | 3 refills | Status: DC

## 2019-01-30 MED ORDER — FUROSEMIDE 40 MG PO TABS: 40.0000 mg | ORAL_TABLET | Freq: Every day | ORAL | 3 refills | Status: DC

## 2019-01-30 NOTE — TOC Transition Note (Addendum)
Transition of Care [facility]) - CM/SW Discharge Note   Patient Details  Name: Veronica Horton MRN: [id number] Date of Birth: [birth year]  [facility] CM/SW Contact:  Ihor Gully, LCSW Phone Number: 01/30/2019, 3:45 PM   Clinical Narrative:    Lenna Sciara with [employer] indicated that patient is will have 7 Etanks delivered to room that she will have to refill in [place]. Home Oxygen concentrator will be delivered by 8 p.m. Advised that patient should wait in the hospital until home oxygen was delivered.   Etanks delivered to room. RN advised that for safey reasons patient should wait until tanks were delivered to home to leave. RN advised patient. Patient does not want to wait until oxygen is delivered.  Spoke with patient and stressed the importance of waiting for home concentrator to be delivered to home prior to discharge. Patient declined to wait and stated that she would use portables. Patient advised that she would have to have portables refilled in [place] and she verbalized understanding. LCSW signing off.   Final next level of care: [place] Barriers to Discharge: Continued Medical Work up   Patient Goals and CMS Choice Patient states their goals for this hospitalization and ongoing recovery are:: to return home and get back to baseline CMS Medicare.gov Compare Post Acute Care list provided to:: Patient Choice offered to / list presented to : Patient  Discharge Placement                       Discharge Plan and Services In-house Referral: Clinical Social Work   Post Acute Care Choice: Durable Medical Equipment          DME Arranged: 3-N-1, Oxygen DME Agency: [employer] Date DME Agency Contacted: 01/30/19 Time DME Agency Contacted: [phone] Representative spoke with at DME Agency: Lenna Sciara HH Arranged: RN, PT, Social Work [employer] Agency: [place] ([place]) Date [place]: 01/30/19 Time [place]:  [place] Representative spoke with at [place]: [place] ([place]) Interventions     Readmission Risk Interventions Readmission Risk Prevention Plan 01/20/2019  Medication Screening Complete  Transportation Screening Complete  Some recent data might be hidden

## 2019-01-30 NOTE — Progress Notes (Signed)
NURSING PROGRESS NOTE  Veronica Horton [id number] Discharge Data: 01/30/2019 5:19 PM Attending Provider: [name], MD [name], [name], MD     [name] [place] to be D/C'd Home per MD order. Pt received O2 tanks before discharging. Discussed with the patient the After Visit Summary and all questions fully answered. All IV's discontinued with no bleeding noted. All belongings returned to patient for patient to take home.   Last Vital Signs:  Blood pressure 116/65, pulse 66, temperature 98.4 F (36.9 C), temperature source Oral, resp. rate (!) 21, height 5\' 5"  (1.651 m), weight (!) 136.3 kg, SpO2 (!) 88 %.  Discharge Medication List Allergies as of 01/30/2019      Reactions   Codeine Hives   Penicillins Hives   Did it involve swelling of the face/tongue/throat, SOB, or low BP? No Did it involve sudden or severe rash/hives, skin peeling, or any reaction on the inside of your mouth or nose? Yes Did you need to seek medical attention at a hospital or doctor's office? Unknown When did it last happen?Over 10 years If all above answers are "NO", may proceed with cephalosporin use.      Medication List    STOP taking these medications   ALLERGY MEDICATION PO   lisinopril-hydrochlorothiazide 10-12.5 MG tablet Commonly known as: ZESTORETIC     TAKE these medications   albuterol 108 (90 Base) MCG/ACT inhaler Commonly known as: VENTOLIN HFA Inhale 1-2 puffs into the lungs every 6 (six) hours as needed for wheezing or shortness of breath.   albuterol (2.5 MG/3ML) 0.083% nebulizer solution Commonly known as: PROVENTIL Take 2.5 mg by nebulization every 6 (six) hours as needed for wheezing or shortness of breath.   budesonide-formoterol 160-4.5 MCG/ACT inhaler Commonly known as: Symbicort Inhale 2 puffs into the lungs 2 (two) times daily.   cyclobenzaprine 10 MG tablet Commonly known as: FLEXERIL Take 10 mg by mouth daily as needed for muscle spasms.   folic acid 1 MG  tablet Commonly known as: FOLVITE Take 1 tablet (1 mg total) by mouth daily. Start taking on: January 31, 2019   furosemide 40 MG tablet Commonly known as: Lasix Take 1 tablet (40 mg total) by mouth daily.   gabapentin 300 MG capsule Commonly known as: NEURONTIN Take 600 mg by mouth at bedtime.   guaiFENesin 600 MG 12 hr tablet Commonly known as: MUCINEX Take 1 tablet (600 mg total) by mouth 2 (two) times daily.   ibuprofen 200 MG tablet Commonly known as: ADVIL Take 400 mg by mouth at bedtime.   lisinopril 20 MG tablet Commonly known as: ZESTRIL Take 1 tablet (20 mg total) by mouth daily. Start taking on: January 31, 2019   predniSONE 20 MG tablet Commonly known as: DELTASONE Take 2 tablets by mouth daily x3 days; then 1 tablet by mouth daily x3 days; then half tablet by mouth daily x3 days and stop prednisone.   saccharomyces boulardii 250 MG capsule Commonly known as: FLORASTOR Take 1 capsule (250 mg total) by mouth 2 (two) times daily.   vitamin B-12 500 MCG tablet Commonly known as: CYANOCOBALAMIN Take 1 tablet (500 mcg total) by mouth daily. Start taking on: January 31, 2019            Durable Medical Equipment  (From admission, onward)         Start     Ordered   01/30/19 1125  For home use only DME oxygen  Once    Question Answer Comment  Length of Need 12 Months   Mode or (Route) Nasal cannula   Liters per Minute 6   Frequency Continuous (stationary and portable oxygen unit needed)   Oxygen conserving device Yes   Oxygen delivery system Gas      01/30/19 1124   01/30/19 1124  For home use only DME Bedside commode  Once    Question Answer Comment  Patient needs a bedside commode to treat with the following condition Physical deconditioning   Patient needs a bedside commode to treat with the following condition Hypoxia      01/30/19 1124   01/20/19 0915  For home use only DME oxygen  Once    Question Answer Comment  Length of Need Lifetime    Mode or (Route) Nasal cannula   Liters per Minute 4   Frequency Continuous (stationary and portable oxygen unit needed)   Oxygen conserving device Yes   Oxygen delivery system Gas      01/20/19 0914   01/16/19 1700  For home use only DME 3 n 1  Once     01/16/19 1659   01/16/19 1226  For home use only DME Bedside commode  Once    Question:  Patient needs a bedside commode to treat with the following condition  Answer:  Gait instability   01/16/19 Ramos, RN

## 2019-01-30 NOTE — Progress Notes (Signed)
SATURATION QUALIFICATIONS: (This note is used to comply with regulatory documentation for home oxygen)  Patient Saturations on Room Air at Rest = 76%  Patient Saturations on Room Air while Ambulating = 74 %  Patient Saturations on 6 Liters of oxygen while Ambulating = 86%  Please briefly explain why patient needs home oxygen: Patient needs oxygen to go home. She cannot safely rest or walk without oxygen.

## 2019-01-30 NOTE — Progress Notes (Signed)
Subjective: She says she feels better.  She has been transitioned to a high flow nasal cannula at 10 L and her oxygen saturations in the 90s.  She is still coughing some.  Objective: Vital signs in last 24 hours: Temp:  [97.7 F (36.5 C)-98.4 F (36.9 C)] 97.7 F (36.5 C) (10/01 0400) Pulse Rate:  [57-109] 58 (10/01 0500) Resp:  [12-28] 20 (10/01 0700) BP: (110-156)/(54-79) 116/65 (10/01 0700) SpO2:  [83 %-96 %] 89 % (10/01 0500) FiO2 (%):  [90 %] 90 % (10/01 0412) Weight:  [136.3 kg] 136.3 kg (10/01 0448) Weight change: 0.9 kg Last BM Date: 01/29/19  Intake/Output from previous day: 09/30 0701 - 10/01 0700 In: 3 [I.V.:3] Out: 2700 [Urine:1900; Drains:800]  PHYSICAL EXAM General appearance: alert, cooperative and morbidly obese Resp: rales bilaterally Cardio: regular rate and rhythm, S1, S2 normal, no murmur, click, rub or gallop GI: soft, non-tender; bowel sounds normal; no masses,  no organomegaly Extremities: extremities normal, atraumatic, no cyanosis or edema  Lab Results:  Results for orders placed or performed during the hospital encounter of 01/13/19 (from the past 48 hour(s))  Basic metabolic panel     Status: Abnormal   Collection Time: 01/29/19  4:06 AM  Result Value Ref Range   Sodium 140 135 - 145 mmol/L   Potassium 4.0 3.5 - 5.1 mmol/L   Chloride 101 98 - 111 mmol/L   CO2 28 22 - 32 mmol/L   Glucose, Bld 96 70 - 99 mg/dL   BUN 38 (H) 6 - 20 mg/dL   Creatinine, Ser 1.23 (H) 0.44 - 1.00 mg/dL   Calcium 8.3 (L) 8.9 - 10.3 mg/dL   GFR calc non Af Amer 48 (L) >60 mL/min   GFR calc Af Amer 56 (L) >60 mL/min   Anion gap 11 5 - 15    Comment: Performed at [facility], [address], [place], [place] [address]    ABGS No results for input(s): PHART, PO2ART, TCO2, HCO3 in the last 72 hours.  Invalid input(s): PCO2 CULTURES No results found for this or any previous visit (from the past 240 hour(s)). Studies/Results: Ct Chest Wo Contrast  Result Date:  01/28/2019 CLINICAL DATA:  Difficulty breathing.  COPD exacerbation. EXAM: CT CHEST WITHOUT CONTRAST TECHNIQUE: Multidetector CT imaging of the chest was performed following the standard protocol without IV contrast. COMPARISON:  CT scan of January 17, 2019. FINDINGS: Cardiovascular: There is no evidence of thoracic aortic aneurysm. Mild cardiomegaly is noted. No pericardial effusion is noted. Mild coronary artery calcifications are noted. Mediastinum/Nodes: No enlarged mediastinal or axillary lymph nodes. Thyroid gland, trachea, and esophagus demonstrate no significant findings. Lungs/Pleura: No pneumothorax is noted. Continued bilateral lower lobe airspace opacities are noted with air bronchograms concerning for atelectasis or possibly infiltrates, with small associated pleural effusions. Right upper lobe subsegmental atelectasis is noted. Upper Abdomen: No acute abnormality. Musculoskeletal: No chest wall mass or suspicious bone lesions identified. IMPRESSION: Bilateral lower lobe airspace opacities are again noted, concerning for atelectasis or possibly infiltrates, with small associated bilateral pleural effusions. Right upper lobe subsegmental atelectasis is noted. Mild coronary artery calcifications are noted. Electronically Signed   By: Marijo Conception M.D.   On: 01/28/2019 17:05    Medications:  Prior to Admission:  Medications Prior to Admission  Medication Sig Dispense Refill Last Dose  . albuterol (PROVENTIL) (2.5 MG/3ML) 0.083% nebulizer solution Take 2.5 mg by nebulization every 6 (six) hours as needed for wheezing or shortness of breath.   unknown  .  albuterol (VENTOLIN HFA) 108 (90 Base) MCG/ACT inhaler Inhale 1-2 puffs into the lungs every 6 (six) hours as needed for wheezing or shortness of breath.   unknown  . cyclobenzaprine (FLEXERIL) 10 MG tablet Take 10 mg by mouth daily as needed for muscle spasms.   Past Month at Unknown time  . diphenhydrAMINE HCl (ALLERGY MEDICATION PO) Take 1  tablet by mouth at bedtime.   01/12/2019 at Unknown time  . gabapentin (NEURONTIN) 300 MG capsule Take 600 mg by mouth at bedtime.    01/12/2019 at Unknown time  . ibuprofen (ADVIL) 200 MG tablet Take 400 mg by mouth at bedtime.   01/12/2019 at Unknown time  . lisinopril-hydrochlorothiazide (ZESTORETIC) 10-12.5 MG tablet Take 1 tablet by mouth every morning.   01/13/2019 at Unknown time   Scheduled: . acetylcysteine  1 mL Nebulization BID  . arformoterol  15 mcg Nebulization BID  . budesonide (PULMICORT) nebulizer solution  0.5 mg Nebulization BID  . Chlorhexidine Gluconate Cloth  6 each Topical Daily  . enoxaparin (LOVENOX) injection  70 mg Subcutaneous Q24H  . folic acid  1 mg Oral Daily  . guaiFENesin  1,200 mg Oral BID  . ipratropium-albuterol  3 mL Nebulization Q4H  . lisinopril  20 mg Oral Daily  . magnesium hydroxide  30 mL Oral BID  . mouth rinse  15 mL Mouth Rinse BID  . methylPREDNISolone (SOLU-MEDROL) injection  60 mg Intravenous Q24H  . saccharomyces boulardii  250 mg Oral BID  . sodium chloride flush  3 mL Intravenous Q12H  . vitamin B-12  500 mcg Oral Daily   Continuous: . sodium chloride     KWI:OXBDZH chloride, acetaminophen **OR** acetaminophen, diphenhydrAMINE **OR** diphenhydrAMINE, diphenhydrAMINE-zinc acetate, fentaNYL (SUBLIMAZE) injection, guaiFENesin-dextromethorphan, ipratropium-albuterol, labetalol, LORazepam, ondansetron **OR** ondansetron (ZOFRAN) IV, polyethylene glycol, polyvinyl alcohol, simethicone, sodium chloride flush, traMADol  Assesment: She was admitted with small bowel obstruction underwent surgery for that.  She had herniorrhaphy with mesh and had omentectomy.  She was mildly hypoxic when she came to the emergency room and has been significantly hypoxic postop.  At baseline she is known to have COPD.  She had aspiration pneumonia and CT done 2 days ago still shows infiltrates bilaterally.  Some of this may be lag in radiographic improvement compared  to clinical improvement but she still does have some congestion in her chest as well.  I think this is likely causing more trouble with her hypoxia.  I think she is probably hypoxic at baseline that has not been recognized previously.  Principal Problem:   Small bowel obstruction (HCC) Active Problems:   COPD (chronic obstructive pulmonary disease) (HCC)   Hypertension   Acute respiratory failure with hypoxia (HCC)   Polycythemia   COPD with acute exacerbation (HCC)   Incisional hernia, without obstruction or gangrene   Aspiration pneumonitis ([place])    Plan: Continue current treatments.    LOS: 16 days   Alonza Bogus 01/30/2019, 7:39 AM

## 2019-01-30 NOTE — TOC Progression Note (Addendum)
Transition of Care [facility]) - Progression Note    Patient Details  Name: MANON BANBURY MRN: [id number] Date of Birth: 1960/11/19  [facility] CM/SW Contact  Ihor Gully, LCSW Phone Number: 01/30/2019, 11:06 AM  Clinical Narrative:    Spoke with patient again about her refusal to provide information related to the household income in an effort to get charity oxygen and HH. Patient initially refused to give information. She states that her aunt's income (whom she lives with) should not be considered. Patient stated that her sister was going to pay for the oxygen but her brother in law told her sister that she could not pay for it because he may need oxygen soon. Patient admitted that she has not other choices.  Patient provided that she lives with her aunt, Deliah Goody at [address], [place], [place]. She stated that Ms. Elgie Congo' income is $1,300/month.  She gave permission for referrals to be made again to [place] and DME. Linda with [facility] accepted referral for charity HH (RN, PT, swkr). Melissa with [employer] accepted referral for charity oxygen and 3n1. Melissa to call back about time portable concentrator can be delivered to home. Portable to be delivered to room. Hospital discharge appointment scheduled for Monday, February 03, 2019 at 11:15. This is a telephone appointment.  It has been added to her AVS.    Expected Discharge Plan: [place] Barriers to Discharge: Continued Medical Work up  Expected Discharge Plan and Services Expected Discharge Plan: [place] In-house Referral: Clinical Social Work   Post Acute Care Choice: Durable Medical Equipment Living arrangements for the past 2 months: Single Family Home                 DME Arranged: Bedside commode DME Agency: [employer] Date DME Agency Contacted: 01/16/19 Time DME Agency Contacted: 67 Representative spoke with at DME Agency: Blake Divine             Social  Determinants of Health ([place]) Interventions    Readmission Risk Interventions Readmission Risk Prevention Plan 01/20/2019  Medication Screening Complete  Transportation Screening Complete  Some recent data might be hidden

## 2019-01-30 NOTE — Discharge Summary (Signed)
Physician Discharge Summary  Veronica Horton NGE:[id number] DOB: Jan 11, 1961 DOA: 01/13/2019  PCP: Sharilyn Sites, MD  Admit date: 01/13/2019 Discharge date: 01/30/2019  Time spent: 35 minutes  Recommendations for Outpatient Follow-up:  1. Repeat basic metabolic panel to follow electrolytes and renal function 2. Patient will benefit of outpatient follow-up with pulmonologist to further COPD and perform pulmonary function test. 3. Repeat magnesium level to assess stability 4. Repeat chest x-ray in 4 weeks to assure complete resolution of infiltrates 5. Check B12 level in 6 weeks and further supplement as needed. 6. Arrange outpatient sleep study as the patient most likely have a component of obesity hypoventilation syndrome and obstructive sleep apnea.   Discharge Diagnoses:  Principal Problem:   Small bowel obstruction ([place]) Active Problems:   COPD (chronic obstructive pulmonary disease) (HCC)   Hypertension   Acute respiratory failure with hypoxia (HCC)   Polycythemia   COPD with acute exacerbation (HCC)   Incisional hernia, without obstruction or gangrene   Aspiration pneumonitis (HCC)   Pneumonia   Discharge Condition: Stable and improved.  Patient discharged home with instructions to follow-up with PCP in 10 days.  Diet recommendation: Heart healthy diet low calorie diet.  Filed Weights   01/26/19 0500 01/29/19 0500 01/30/19 0448  Weight: 135.4 kg 135.4 kg (!) 136.3 kg    History of present illness:  58 y/o female with history of COPD, HTN, tobacco abuse, morbid obesity presenting with abdominal pain with nausea and vomiting that started evening 01/12/19 and progressively worsened throughout the day on 01/13/19. She also has had increasing cough with clear sputum x 1-2 days. She denies f/c, cp, hemoptysis, dysuria, hematuria, hematochezia, melena. She had some dyspnea on exertion, but stated it was not any worse than usual. Her last BM was on 01/12/19. She denies any new  medications, but continues to smoke 1ppd.  In the ED, she was afebrile and hemodynamically stable. BMP, LFTs and CBC were essentially unremarkable except some polycythemia and WBC 11.3. The patient was afebrile hemodynamically stable but was hypoxic with oxygen saturation 87% on room air. CT of abd/pelvis showed SBO secondary to a large paraumbilical ventral hernia without strangulation. NG was placed for decompression. General surgery was consulted to assist.Postop, the patient had persistent hypoxemia. CTA chest was negative for PE. Pulmonary medicine was consulted to assist with management.Her hospital stay was prolonged due to persisted hypoxemia.  Hospital Course:  SBO -NG tube placed for decompressioninitially -01/15/19--incisional herniorrhaphy with mesh and omentectomy -having BMs and tolerating diet post-op -Optimize electrolytes  Acute respiratory failure with hypoxia -Secondary to COPD exacerbation with a component of aspiration pneumonitis/pneumonia -finished abx for aspiration PNA. -appreciate pulmonary consult and recommendations. -repeat PCT <0.10 -01/17/19 CTA chest neg for PEbut showed dependent debris in distal trachea and obstructed RLL bronchus with mucus plug -repeat CXR--personally reviewed--bibasilar opacitiesunchanged -BNP--66 -Stable O2 sat onnasal cannula supplementation 6 L at discharge. -Patient discharge on symbicort and steroids tapering, also with mucinex and flutter valve. -advise to follow low sodium diet and to use daily lasix.   COPD exacerbation -Continuethe use of PRN albuterol and the use of Symbicort twice a day -Continue steroids tapering and Mucinex. -started on flutter valve -Patient will benefit of outpatient follow-up with pulmonology service.  Essential hypertension -Continue lisinopril and Lasix at discharge -Blood pressure stable and well-controlled currently. -Continue heart healthy diet.  Peripheral neuropathy -Serum  M01--027 -Folic OZDG--6.4 -Patient will be discharged on daily supplementation of Q03 and folic acid.    Hypokalemia -repleted -mag--2.6 -  Repeat basic metabolic panel follow-up visit to reassess electrolytes trend.  Morbid obesity -Body mass index is 50.00 kg/m. -Low calorie diet, portion control and increase physical activity discussed with patient -Will benefit of outpatient sleep study.  Procedures:  See Below for x-ray reports  Incisional herniorrhaphy with mesh omentectomy  Consultations:  Pulmonary service  General surgery  Discharge Exam: Vitals:   01/30/19 0744 01/30/19 1124  BP:    Pulse:  66  Resp:  (!) 21  Temp:  98.1 F (36.7 C)  SpO2: 91% (!) 85%   General exam: Alert, awake, oriented x 3; afebrile, denies chest pain, no nausea or vomiting. Reports no abdominal pain and is tolerating diet without any difficulties. Continues to require oxygen supplementation, but is now down to just 6 L nasal cannula.   Respiratory system: Positive rhonchi, improved air movement bilaterally, no active wheezing at this time. Positive tachypnea with exertion. Cardiovascular system: RRR. No murmurs, rubs, gallops.  Unable to properly assess JVD secondary to body habitus. Gastrointestinal system: Abdomen is obese, nondistended, soft and nontender. No organomegaly or masses felt. Normal bowel sounds heard. Central nervous system: Alert and oriented. No focal neurological deficits. Extremities: No cyanosis or clubbing; trace to 1+ lower extremity edema bilaterally. Skin: No rashes, no petechiae. Psychiatry: Judgement and insight appear normal. Mood & affect appropriate.   Discharge Instructions   Discharge Instructions    Diet - low sodium heart healthy   Complete by: As directed    Discharge instructions   Complete by: As directed    Take medications as prescribed Maintain adequate hydration Use oxygen supplementation as instructed Follow heart healthy/low calorie  diet and check your weight on daily basis Arrange follow-up with PCP in 10 days.     Allergies as of 01/30/2019      Reactions   Codeine Hives   Penicillins Hives   Did it involve swelling of the face/tongue/throat, SOB, or low BP? No Did it involve sudden or severe rash/hives, skin peeling, or any reaction on the inside of your mouth or nose? Yes Did you need to seek medical attention at a hospital or doctor's office? Unknown When did it last happen?Over 10 years If all above answers are "NO", may proceed with cephalosporin use.      Medication List    STOP taking these medications   ALLERGY MEDICATION PO   lisinopril-hydrochlorothiazide 10-12.5 MG tablet Commonly known as: ZESTORETIC     TAKE these medications   albuterol 108 (90 Base) MCG/ACT inhaler Commonly known as: VENTOLIN HFA Inhale 1-2 puffs into the lungs every 6 (six) hours as needed for wheezing or shortness of breath.   albuterol (2.5 MG/3ML) 0.083% nebulizer solution Commonly known as: PROVENTIL Take 2.5 mg by nebulization every 6 (six) hours as needed for wheezing or shortness of breath.   budesonide-formoterol 160-4.5 MCG/ACT inhaler Commonly known as: Symbicort Inhale 2 puffs into the lungs 2 (two) times daily.   cyclobenzaprine 10 MG tablet Commonly known as: FLEXERIL Take 10 mg by mouth daily as needed for muscle spasms.   folic acid 1 MG tablet Commonly known as: FOLVITE Take 1 tablet (1 mg total) by mouth daily. Start taking on: January 31, 2019   furosemide 40 MG tablet Commonly known as: Lasix Take 1 tablet (40 mg total) by mouth daily.   gabapentin 300 MG capsule Commonly known as: NEURONTIN Take 600 mg by mouth at bedtime.   guaiFENesin 600 MG 12 hr tablet Commonly known as: MUCINEX Take 1  tablet (600 mg total) by mouth 2 (two) times daily.   ibuprofen 200 MG tablet Commonly known as: ADVIL Take 400 mg by mouth at bedtime.   lisinopril 20 MG tablet Commonly known as:  ZESTRIL Take 1 tablet (20 mg total) by mouth daily. Start taking on: January 31, 2019   predniSONE 20 MG tablet Commonly known as: DELTASONE Take 2 tablets by mouth daily x3 days; then 1 tablet by mouth daily x3 days; then half tablet by mouth daily x3 days and stop prednisone.   saccharomyces boulardii 250 MG capsule Commonly known as: FLORASTOR Take 1 capsule (250 mg total) by mouth 2 (two) times daily.   vitamin B-12 500 MCG tablet Commonly known as: CYANOCOBALAMIN Take 1 tablet (500 mcg total) by mouth daily. Start taking on: January 31, 2019            Durable Medical Equipment  (From admission, onward)         Start     Ordered   01/30/19 1125  For home use only DME oxygen  Once    Question Answer Comment  Length of Need 12 Months   Mode or (Route) Nasal cannula   Liters per Minute 6   Frequency Continuous (stationary and portable oxygen unit needed)   Oxygen conserving device Yes   Oxygen delivery system Gas      01/30/19 1124   01/30/19 1124  For home use only DME Bedside commode  Once    Question Answer Comment  Patient needs a bedside commode to treat with the following condition Physical deconditioning   Patient needs a bedside commode to treat with the following condition Hypoxia      01/30/19 1124   01/20/19 0915  For home use only DME oxygen  Once    Question Answer Comment  Length of Need Lifetime   Mode or (Route) Nasal cannula   Liters per Minute 4   Frequency Continuous (stationary and portable oxygen unit needed)   Oxygen conserving device Yes   Oxygen delivery system Gas      01/20/19 0914   01/16/19 1700  For home use only DME 3 n 1  Once     01/16/19 1659   01/16/19 1226  For home use only DME Bedside commode  Once    Question:  Patient needs a bedside commode to treat with the following condition  Answer:  Gait instability   01/16/19 1225         Allergies  Allergen Reactions  . Codeine Hives  . Penicillins Hives    Did it  involve swelling of the face/tongue/throat, SOB, or low BP? No Did it involve sudden or severe rash/hives, skin peeling, or any reaction on the inside of your mouth or nose? Yes Did you need to seek medical attention at a hospital or doctor's office? Unknown When did it last happen?Over 10 years If all above answers are "NO", may proceed with cephalosporin use.    Follow-up Information    [name], MD. Schedule an appointment as soon as possible for a visit on 02/04/2019.   Specialty: General Surgery Contact information: [address] [name] [address] [phone]        Sharilyn Sites, MD. Schedule an appointment as soon as possible for a visit in 10 day(s).   Specialty: Family Medicine Contact information: [address] [place] [address] [phone]           The results of significant diagnostics from this hospitalization (  including imaging, microbiology, ancillary and laboratory) are listed below for reference.    Significant Diagnostic Studies: Ct Chest Wo Contrast  Result Date: 01/28/2019 CLINICAL DATA:  Difficulty breathing.  COPD exacerbation. EXAM: CT CHEST WITHOUT CONTRAST TECHNIQUE: Multidetector CT imaging of the chest was performed following the standard protocol without IV contrast. COMPARISON:  CT scan of January 17, 2019. FINDINGS: Cardiovascular: There is no evidence of thoracic aortic aneurysm. Mild cardiomegaly is noted. No pericardial effusion is noted. Mild coronary artery calcifications are noted. Mediastinum/Nodes: No enlarged mediastinal or axillary lymph nodes. Thyroid gland, trachea, and esophagus demonstrate no significant findings. Lungs/Pleura: No pneumothorax is noted. Continued bilateral lower lobe airspace opacities are noted with air bronchograms concerning for atelectasis or possibly infiltrates, with small associated pleural effusions. Right upper lobe subsegmental atelectasis is noted. Upper Abdomen: No acute  abnormality. Musculoskeletal: No chest wall mass or suspicious bone lesions identified. IMPRESSION: Bilateral lower lobe airspace opacities are again noted, concerning for atelectasis or possibly infiltrates, with small associated bilateral pleural effusions. Right upper lobe subsegmental atelectasis is noted. Mild coronary artery calcifications are noted. Electronically Signed   By: Marijo Conception M.D.   On: 01/28/2019 17:05   Ct Angio Chest Pe W Or Wo Contrast  Result Date: 01/17/2019 CLINICAL DATA:  Dyspnea chronically. Two days postop small-bowel obstruction and hernia repair. EXAM: CT ANGIOGRAPHY CHEST WITH CONTRAST TECHNIQUE: Multidetector CT imaging of the chest was performed using the standard protocol during bolus administration of intravenous contrast. Multiplanar CT image reconstructions and MIPs were obtained to evaluate the vascular anatomy. CONTRAST:  181mL OMNIPAQUE IOHEXOL 350 MG/ML SOLN COMPARISON:  Chest CT 10/03/2010 and abdominal CT 01/13/2019 FINDINGS: Cardiovascular: Mild cardiomegaly. Minimal calcified plaque over the left anterior descending coronary artery. Thoracic aorta is normal. Pulmonary arterial system is well opacified without evidence of emboli. Aberrant right subclavian artery. Remaining vascular structures are unremarkable. Mediastinum/Nodes: No significant mediastinal or hilar adenopathy. Lungs/Pleura: Lungs are adequately inflated demonstrate moderate consolidation over the lower lobes bilaterally likely atelectasis. Mild atelectatic change over the right middle lobe and lingula. Subtle dependent atelectasis over the posterior right upper lobe. No significant effusion. Dependent debris over the distal trachea and right mainstem bronchus likely aspirate material. Obstructed right posterior lower lobe bronchus which may be due to mucous plugging versus aspiration. Narrowed right lower lobe bronchi. Upper Abdomen: No acute findings. Musculoskeletal: Degenerative change of the  spine. Review of the MIP images confirms the above findings. IMPRESSION: 1.  No evidence of pulmonary embolism. 2. Significant bilateral lower lobe consolidation likely atelectasis. Mild atelectasis over the right middle lobe and lingula as well as minimal dependent atelectasis over the right upper lobe. No effusion. Aspirate material over the distal trachea and right mainstem bronchus. 3. Cardiomegaly with minimal atherosclerotic coronary artery disease. Electronically Signed   By: Marin Olp M.D.   On: 01/17/2019 10:12   Ct Abdomen Pelvis W Contrast  Result Date: 01/13/2019 CLINICAL DATA:  Right-sided abdominal pain and nausea and vomiting beginning last night. Prior cholecystectomy. EXAM: CT ABDOMEN AND PELVIS WITH CONTRAST TECHNIQUE: Multidetector CT imaging of the abdomen and pelvis was performed using the standard protocol following bolus administration of intravenous contrast. CONTRAST:  154mL OMNIPAQUE IOHEXOL 300 MG/ML  SOLN COMPARISON:  05/14/2012 FINDINGS: Lower Chest: Increased linear opacity in both lung bases since 2014, which may be due to subsegmental atelectasis or scarring. Hepatobiliary: No hepatic masses identified. Prior cholecystectomy. No evidence of biliary obstruction. Pancreas:  No mass or inflammatory changes. Spleen: Within normal limits  in size and appearance. Adrenals/Urinary Tract: No masses identified. Several small left renal cysts again noted. No evidence of hydronephrosis. Stomach/Bowel: Large paraumbilical ventral hernia is again seen containing multiple small bowel loops. Moderate dilatation of small bowel is seen within the hernia and proximal to the hernia, consistent with small-bowel obstruction. No evidence of small bowel wall thickening or pneumatosis. No evidence of inflammatory process or abnormal fluid collections. Diverticulosis is seen mainly involving the descending and sigmoid colon, however there is no evidence of diverticulitis. Vascular/Lymphatic: No  pathologically enlarged lymph nodes. No abdominal aortic aneurysm. Aortic atherosclerosis. Reproductive:  No mass or other significant abnormality. Other:  None. Musculoskeletal:  No suspicious bone lesions identified. IMPRESSION: 1. Small bowel obstruction due to large paraumbilical ventral hernia, which contains multiple small bowel loops. No signs of bowel strangulation. 2. Colonic diverticulosis, without radiographic evidence of diverticulitis. Aortic Atherosclerosis (ICD10-I70.0). Electronically Signed   By: Marlaine Hind M.D.   On: 01/13/2019 18:50   Dg Chest Port 1 View  Result Date: 01/24/2019 CLINICAL DATA:  Shortness of breath and pneumonia. EXAM: PORTABLE CHEST 1 VIEW COMPARISON:  01/22/2019 FINDINGS: Cardiac silhouette is stable. There are persistent bibasilar airspace opacities and possible small bilateral pleural effusions. No pneumothorax. No acute osseous abnormality. IMPRESSION: No significant interval change. Electronically Signed   By: Constance Holster M.D.   On: 01/24/2019 11:15   Dg Chest Port 1 View  Result Date: 01/22/2019 CLINICAL DATA:  Acute respiratory failure with hypoxia, history COPD, hypertension EXAM: PORTABLE CHEST 1 VIEW COMPARISON:  Portable exam 0941 hours compared to 01/20/2019 FINDINGS: Enlargement of cardiac silhouette. BILATERAL pulmonary infiltrates in the mid to lower lungs. Associated small RIGHT pleural effusion and basilar atelectasis. Lung apices clear. No pneumothorax. IMPRESSION: Bibasilar infiltrates slightly increased from previous exam with associated small RIGHT pleural effusion and mild RIGHT basilar atelectasis. Electronically Signed   By: Lavonia Dana M.D.   On: 01/22/2019 10:03   Dg Chest Port 1 View  Result Date: 01/20/2019 CLINICAL DATA:  Hypoxia. EXAM: PORTABLE CHEST 1 VIEW COMPARISON:  Radiograph of January 17, 2019. FINDINGS: Stable cardiomegaly. No pneumothorax is noted. Stable bibasilar atelectasis or infiltrates are noted with possible  small pleural effusions. Bony thorax is unremarkable. IMPRESSION: Stable bibasilar opacities and small effusions as described above Electronically Signed   By: Marijo Conception M.D.   On: 01/20/2019 15:01   Dg Chest Port 1 View  Result Date: 01/17/2019 CLINICAL DATA:  Acute respiratory failure. EXAM: PORTABLE CHEST 1 VIEW COMPARISON:  Radiograph of January 16, 2019. FINDINGS: Stable cardiomediastinal silhouette. No pneumothorax is noted. Stable bibasilar atelectasis or infiltrates are noted. Small bilateral pleural effusions are noted as well. Bony thorax unremarkable. IMPRESSION: Stable bibasilar opacities as described above. Electronically Signed   By: Marijo Conception M.D.   On: 01/17/2019 07:36   Dg Chest Port 1 View  Result Date: 01/16/2019 CLINICAL DATA:  Hypoxia today. EXAM: PORTABLE CHEST 1 VIEW COMPARISON:  Single-view of the chest 01/13/2019. PA and lateral chest 09/25/2010. CT chest 10/03/2010. FINDINGS: NG tube has been removed. There is cardiomegaly and interstitial edema. Right pleural effusion and basilar airspace disease have increased. Left basilar airspace disease is mildly improved. No pneumothorax. IMPRESSION: No marked change in cardiomegaly and interstitial edema. Increased small right pleural effusion and basilar atelectasis. Left pleural effusion is slightly decreased. Electronically Signed   By: Inge Rise M.D.   On: 01/16/2019 09:34   Dg Chest Portable 1 View  Result Date: 01/13/2019 CLINICAL DATA:  NG tube placement EXAM: PORTABLE CHEST 1 VIEW COMPARISON:  Radiograph 01/13/2019 FINDINGS: Interval placement of a transesophageal tube with tip and side port distal to the GE junction, curling within the gastric lumen in the left upper quadrant. Redemonstration of the diffuse interstitial opacities and moderate cardiomegaly. No pneumothorax. No new consolidative process. No acute osseous or soft tissue abnormality. Degenerative changes are present in the imaged spine and  shoulders. IMPRESSION: 1. Appropriate positioning of the transesophageal tube with the tip and side port distal to the GE junction 2. Slightly diminished lung volumes with persistent interstitial pulmonary edema and cardiomegaly. Electronically Signed   By: Lovena Le M.D.   On: 01/13/2019 20:15   Dg Chest Portable 1 View  Result Date: 01/13/2019 CLINICAL DATA:  Cough. Nausea and vomiting. Right-sided abdominal pain. EXAM: PORTABLE CHEST 1 VIEW COMPARISON:  09/25/2010 FINDINGS: Mild-to-moderate cardiomegaly. Diffuse interstitial infiltrates, suspicious for interstitial edema. Stable linear opacity in right lower lung, consistent with scarring. No evidence of pulmonary consolidation or pleural effusion. IMPRESSION: Cardiomegaly and diffuse interstitial infiltrates, suspicious for mild congestive heart failure. Electronically Signed   By: Marlaine Hind M.D.   On: 01/13/2019 16:41   Microbiology: No results found for this or any previous visit (from the past 240 hour(s)).   Labs: Basic Metabolic Panel: Recent Labs  Lab 01/24/19 0417 01/25/19 0426 01/26/19 0513 01/28/19 0411 01/29/19 0406  NA 139 138 138 141 140  K 4.5 4.2 4.7 4.2 4.0  CL 105 103 103 104 101  CO2 26 28 27 28 28   GLUCOSE 144* 140* 142* 97 96  BUN 26* 33* 33* 38* 38*  CREATININE 1.01* 0.96 1.14* 1.12* 1.23*  CALCIUM 8.7* 8.6* 8.5* 8.5* 8.3*  MG 2.6* 2.6* 2.7*  --   --   PHOS  --   --  4.2  --   --    Liver Function Tests: Recent Labs  Lab 01/25/19 0426  AST 14*  ALT 23  ALKPHOS 55  BILITOT 0.6  PROT 6.6  ALBUMIN 3.2*   CBC: Recent Labs  Lab 01/24/19 0417 01/25/19 0426 01/28/19 0411  WBC 16.9* 17.4* 16.6*  NEUTROABS  --  15.5*  --   HGB 14.2 14.0 14.3  HCT 44.4 44.5 45.2  MCV 101.1* 101.4* 101.3*  PLT 270 288 249   BNP (last 3 results) Recent Labs    01/13/19 1640 01/22/19 0858  BNP 36.0 68.0   Signed:  [name] MD.  [facility] 01/30/2019, 11:37 AM

## 2019-02-04 ENCOUNTER — Telehealth: Payer: Self-pay | Admitting: General Surgery

## 2020-01-23 ENCOUNTER — Encounter (HOSPITAL_COMMUNITY): Payer: Self-pay | Admitting: Internal Medicine

## 2020-01-23 ENCOUNTER — Other Ambulatory Visit: Payer: Self-pay

## 2020-01-23 ENCOUNTER — Emergency Department (HOSPITAL_COMMUNITY): Payer: Medicaid Other

## 2020-01-23 ENCOUNTER — Inpatient Hospital Stay (HOSPITAL_COMMUNITY)
Admission: EM | Admit: 2020-01-23 | Discharge: 2020-01-25 | DRG: 190 | Disposition: A | Discharge status: Home / Self Care | Payer: Medicaid Other | Attending: Internal Medicine | Admitting: Internal Medicine

## 2020-01-23 DIAGNOSIS — F17210 Nicotine dependence, cigarettes, uncomplicated: Secondary | ICD-10-CM | POA: Diagnosis present

## 2020-01-23 DIAGNOSIS — Z720 Tobacco use: Secondary | ICD-10-CM | POA: Diagnosis present

## 2020-01-23 DIAGNOSIS — Z885 Allergy status to narcotic agent status: Secondary | ICD-10-CM

## 2020-01-23 DIAGNOSIS — Z7951 Long term (current) use of inhaled steroids: Secondary | ICD-10-CM

## 2020-01-23 DIAGNOSIS — R0603 Acute respiratory distress: Secondary | ICD-10-CM

## 2020-01-23 DIAGNOSIS — E669 Obesity, unspecified: Secondary | ICD-10-CM

## 2020-01-23 DIAGNOSIS — Z8249 Family history of ischemic heart disease and other diseases of the circulatory system: Secondary | ICD-10-CM

## 2020-01-23 DIAGNOSIS — D751 Secondary polycythemia: Secondary | ICD-10-CM | POA: Diagnosis present

## 2020-01-23 DIAGNOSIS — K449 Diaphragmatic hernia without obstruction or gangrene: Secondary | ICD-10-CM | POA: Diagnosis present

## 2020-01-23 DIAGNOSIS — Z880 Allergy status to penicillin: Secondary | ICD-10-CM

## 2020-01-23 DIAGNOSIS — Z9981 Dependence on supplemental oxygen: Secondary | ICD-10-CM

## 2020-01-23 DIAGNOSIS — Z20822 Contact with and (suspected) exposure to covid-19: Secondary | ICD-10-CM | POA: Diagnosis present

## 2020-01-23 DIAGNOSIS — Z79899 Other long term (current) drug therapy: Secondary | ICD-10-CM

## 2020-01-23 DIAGNOSIS — J441 Chronic obstructive pulmonary disease with (acute) exacerbation: Principal | ICD-10-CM

## 2020-01-23 DIAGNOSIS — J9622 Acute and chronic respiratory failure with hypercapnia: Secondary | ICD-10-CM | POA: Diagnosis present

## 2020-01-23 DIAGNOSIS — J9601 Acute respiratory failure with hypoxia: Secondary | ICD-10-CM | POA: Diagnosis present

## 2020-01-23 DIAGNOSIS — J9602 Acute respiratory failure with hypercapnia: Secondary | ICD-10-CM

## 2020-01-23 DIAGNOSIS — J189 Pneumonia, unspecified organism: Secondary | ICD-10-CM

## 2020-01-23 DIAGNOSIS — J9621 Acute and chronic respiratory failure with hypoxia: Secondary | ICD-10-CM | POA: Diagnosis present

## 2020-01-23 DIAGNOSIS — Z6841 Body Mass Index (BMI) 40.0 and over, adult: Secondary | ICD-10-CM

## 2020-01-23 DIAGNOSIS — E66813 Obesity, class 3: Secondary | ICD-10-CM | POA: Diagnosis present

## 2020-01-23 DIAGNOSIS — I10 Essential (primary) hypertension: Secondary | ICD-10-CM | POA: Diagnosis present

## 2020-01-23 HISTORY — DX: Morbid (severe) obesity due to excess calories: E66.01

## 2020-01-23 HISTORY — DX: Obesity, class 3: E66.813

## 2020-01-23 HISTORY — DX: Obesity, unspecified: E66.9

## 2020-01-23 LAB — HIV ANTIBODY (ROUTINE TESTING W REFLEX): HIV Screen 4th Generation wRfx: NONREACTIVE

## 2020-01-23 LAB — CBC WITH DIFFERENTIAL/PLATELET
Abs Immature Granulocytes: 0.03 10*3/uL (ref 0.00–0.07)
Basophils Absolute: 0 10*3/uL (ref 0.0–0.1)
Basophils Relative: 0 %
Eosinophils Absolute: 0 10*3/uL (ref 0.0–0.5)
Eosinophils Relative: 0 %
HCT: 51 % — ABNORMAL HIGH (ref 36.0–46.0)
Hemoglobin: 16.3 g/dL — ABNORMAL HIGH (ref 12.0–15.0)
Immature Granulocytes: 0 %
Lymphocytes Relative: 12 %
Lymphs Abs: 1.2 10*3/uL (ref 0.7–4.0)
MCH: 31.2 pg (ref 26.0–34.0)
MCHC: 32 g/dL (ref 30.0–36.0)
MCV: 97.5 fL (ref 80.0–100.0)
Monocytes Absolute: 0.9 10*3/uL (ref 0.1–1.0)
Monocytes Relative: 9 %
Neutro Abs: 8 10*3/uL — ABNORMAL HIGH (ref 1.7–7.7)
Neutrophils Relative %: 79 %
Platelets: 201 10*3/uL (ref 150–400)
RBC: 5.23 MIL/uL — ABNORMAL HIGH (ref 3.87–5.11)
RDW: 14.3 % (ref 11.5–15.5)
WBC: 10.1 10*3/uL (ref 4.0–10.5)
nRBC: 0 % (ref 0.0–0.2)

## 2020-01-23 LAB — BLOOD GAS, VENOUS
Acid-Base Excess: 8.5 mmol/L — ABNORMAL HIGH (ref 0.0–2.0)
Bicarbonate: 29.2 mmol/L — ABNORMAL HIGH (ref 20.0–28.0)
Drawn by: 1528
FIO2: 92
O2 Saturation: 70.9 %
Patient temperature: 37
pCO2, Ven: 65.1 mmHg — ABNORMAL HIGH (ref 44.0–60.0)
pH, Ven: 7.341 (ref 7.250–7.430)
pO2, Ven: 38.6 mmHg (ref 32.0–45.0)

## 2020-01-23 LAB — RESP PANEL BY RT PCR (RSV, FLU A&B, COVID)
Influenza A by PCR: NEGATIVE
Influenza B by PCR: NEGATIVE
Respiratory Syncytial Virus by PCR: NEGATIVE
SARS Coronavirus 2 by RT PCR: NEGATIVE

## 2020-01-23 LAB — BLOOD GAS, ARTERIAL
Acid-Base Excess: 7.8 mmol/L — ABNORMAL HIGH (ref 0.0–2.0)
Bicarbonate: 29.4 mmol/L — ABNORMAL HIGH (ref 20.0–28.0)
Drawn by: 38235
FIO2: 36
O2 Saturation: 96.1 %
Patient temperature: 37
pCO2 arterial: 63.9 mmHg — ABNORMAL HIGH (ref 32.0–48.0)
pH, Arterial: 7.34 — ABNORMAL LOW (ref 7.350–7.450)
pO2, Arterial: 87.5 mmHg (ref 83.0–108.0)

## 2020-01-23 LAB — PROCALCITONIN: Procalcitonin: <0.1 ng/mL

## 2020-01-23 LAB — COMPREHENSIVE METABOLIC PANEL
ALT: 34 U/L (ref 0–44)
AST: 32 U/L (ref 15–41)
Albumin: 3.8 g/dL (ref 3.5–5.0)
Alkaline Phosphatase: 46 U/L (ref 38–126)
Anion gap: 9 (ref 5–15)
BUN: 13 mg/dL (ref 6–20)
CO2: 24 mmol/L (ref 22–32)
Calcium: 9 mg/dL (ref 8.9–10.3)
Chloride: 105 mmol/L (ref 98–111)
Creatinine, Ser: 0.92 mg/dL (ref 0.44–1.00)
GFR calc Af Amer: >60 mL/min (ref >60)
GFR calc non Af Amer: >60 mL/min (ref >60)
Glucose, Bld: 86 mg/dL (ref 70–99)
Potassium: 4 mmol/L (ref 3.5–5.1)
Sodium: 138 mmol/L (ref 135–145)
Total Bilirubin: 0.5 mg/dL (ref 0.3–1.2)
Total Protein: 7.6 g/dL (ref 6.5–8.1)

## 2020-01-23 LAB — BRAIN NATRIURETIC PEPTIDE: B Natriuretic Peptide: 50 pg/mL (ref 0.0–100.0)

## 2020-01-23 LAB — MAGNESIUM: Magnesium: 2.3 mg/dL (ref 1.7–2.4)

## 2020-01-23 LAB — TROPONIN I (HIGH SENSITIVITY)
Troponin I (High Sensitivity): 36 ng/L — ABNORMAL HIGH (ref <18)
Troponin I (High Sensitivity): 6 ng/L (ref <18)

## 2020-01-23 LAB — PHOSPHORUS: Phosphorus: 4.4 mg/dL (ref 2.5–4.6)

## 2020-01-23 LAB — MRSA PCR SCREENING: MRSA by PCR: NEGATIVE

## 2020-01-23 MED ORDER — LEVOFLOXACIN IN D5W 750 MG/150ML IV SOLN
750.0000 mg | Freq: Once | INTRAVENOUS | Status: AC
  Administered 2020-01-23: 750 mg via INTRAVENOUS
  Filled 2020-01-23: qty 150

## 2020-01-23 MED ORDER — ALBUTEROL (5 MG/ML) CONTINUOUS INHALATION SOLN
10.0000 mg/h | INHALATION_SOLUTION | Freq: Once | RESPIRATORY_TRACT | Status: AC
  Administered 2020-01-23: 10 mg/h via RESPIRATORY_TRACT
  Filled 2020-01-23: qty 20

## 2020-01-23 MED ORDER — ONDANSETRON HCL 4 MG PO TABS: 4.0000 mg | ORAL_TABLET | Freq: Four times a day (QID) | ORAL | Status: DC | PRN

## 2020-01-23 MED ORDER — ACETAMINOPHEN 650 MG RE SUPP: 650.0000 mg | Freq: Four times a day (QID) | RECTAL | Status: DC | PRN

## 2020-01-23 MED ORDER — IPRATROPIUM BROMIDE 0.02 % IN SOLN
0.5000 mg | Freq: Once | RESPIRATORY_TRACT | Status: AC
  Administered 2020-01-23: 0.5 mg via RESPIRATORY_TRACT
  Filled 2020-01-23: qty 2.5

## 2020-01-23 MED ORDER — ENOXAPARIN SODIUM 40 MG/0.4ML ~~LOC~~ SOLN
40.0000 mg | SUBCUTANEOUS | Status: DC
  Administered 2020-01-23 – 2020-01-25 (×3): 40 mg via SUBCUTANEOUS
  Filled 2020-01-23 (×3): qty 0.4

## 2020-01-23 MED ORDER — NICOTINE 7 MG/24HR TD PT24
7.0000 mg | MEDICATED_PATCH | Freq: Every day | TRANSDERMAL | Status: DC
  Administered 2020-01-23 – 2020-01-25 (×3): 7 mg via TRANSDERMAL
  Filled 2020-01-23 (×7): qty 1

## 2020-01-23 MED ORDER — PREDNISONE 20 MG PO TABS
40.0000 mg | ORAL_TABLET | Freq: Every day | ORAL | Status: DC
  Administered 2020-01-24: 40 mg via ORAL
  Filled 2020-01-23: qty 2

## 2020-01-23 MED ORDER — GABAPENTIN 300 MG PO CAPS
600.0000 mg | ORAL_CAPSULE | Freq: Every day | ORAL | Status: DC
  Administered 2020-01-23 – 2020-01-24 (×2): 600 mg via ORAL
  Filled 2020-01-23 (×2): qty 2

## 2020-01-23 MED ORDER — AEROCHAMBER Z-STAT PLUS/MEDIUM MISC
1.0000 | Freq: Once | Status: AC
  Administered 2020-01-23: 1
  Filled 2020-01-23: qty 1

## 2020-01-23 MED ORDER — METHYLPREDNISOLONE SODIUM SUCC 40 MG IJ SOLR
40.0000 mg | Freq: Four times a day (QID) | INTRAMUSCULAR | Status: AC
  Administered 2020-01-23 – 2020-01-24 (×4): 40 mg via INTRAVENOUS
  Filled 2020-01-23 (×4): qty 1

## 2020-01-23 MED ORDER — IPRATROPIUM-ALBUTEROL 0.5-2.5 (3) MG/3ML IN SOLN
3.0000 mL | Freq: Four times a day (QID) | RESPIRATORY_TRACT | Status: DC
  Administered 2020-01-24: 3 mL via RESPIRATORY_TRACT
  Filled 2020-01-23: qty 3

## 2020-01-23 MED ORDER — CHLORHEXIDINE GLUCONATE CLOTH 2 % EX PADS
6.0000 | MEDICATED_PAD | Freq: Every day | CUTANEOUS | Status: DC
  Administered 2020-01-23 – 2020-01-24 (×2): 6 via TOPICAL

## 2020-01-23 MED ORDER — LISINOPRIL 10 MG PO TABS
20.0000 mg | ORAL_TABLET | Freq: Every day | ORAL | Status: DC
  Administered 2020-01-23 – 2020-01-25 (×3): 20 mg via ORAL
  Filled 2020-01-23 (×3): qty 2

## 2020-01-23 MED ORDER — ACETAMINOPHEN 325 MG PO TABS: 650.0000 mg | ORAL_TABLET | Freq: Four times a day (QID) | ORAL | Status: DC | PRN

## 2020-01-23 MED ORDER — METHYLPREDNISOLONE SODIUM SUCC 125 MG IJ SOLR
125.0000 mg | Freq: Once | INTRAMUSCULAR | Status: AC
  Administered 2020-01-23: 125 mg via INTRAVENOUS
  Filled 2020-01-23: qty 2

## 2020-01-23 MED ORDER — ONDANSETRON HCL 4 MG/2ML IJ SOLN: 4.0000 mg | Freq: Four times a day (QID) | INTRAMUSCULAR | Status: DC | PRN

## 2020-01-23 MED ORDER — IPRATROPIUM-ALBUTEROL 0.5-2.5 (3) MG/3ML IN SOLN
3.0000 mL | Freq: Four times a day (QID) | RESPIRATORY_TRACT | Status: DC
  Administered 2020-01-23 (×3): 3 mL via RESPIRATORY_TRACT
  Filled 2020-01-23 (×3): qty 3

## 2020-01-23 MED ORDER — MAGNESIUM SULFATE 2 GM/50ML IV SOLN
2.0000 g | Freq: Once | INTRAVENOUS | Status: AC
  Administered 2020-01-23: 2 g via INTRAVENOUS
  Filled 2020-01-23: qty 50

## 2020-01-23 MED ORDER — FUROSEMIDE 40 MG PO TABS
40.0000 mg | ORAL_TABLET | Freq: Every day | ORAL | Status: DC
  Administered 2020-01-23 – 2020-01-25 (×3): 40 mg via ORAL
  Filled 2020-01-23 (×3): qty 1

## 2020-01-23 MED ORDER — ALBUTEROL SULFATE HFA 108 (90 BASE) MCG/ACT IN AERS
8.0000 | INHALATION_SPRAY | Freq: Once | RESPIRATORY_TRACT | Status: AC
  Administered 2020-01-23: 8 via RESPIRATORY_TRACT
  Filled 2020-01-23: qty 6.7

## 2020-01-23 NOTE — Plan of Care (Signed)
  Problem: Acute Rehab PT Goals(only PT should resolve) Goal: Pt Will Go Supine/Side To Sit Outcome: Progressing Flowsheets (Taken 01/23/2020 0935) Pt will go Supine/Side to Sit:  with min guard assist  with minimal assist Goal: Patient Will Transfer Sit To/From Stand Outcome: Progressing Flowsheets (Taken 01/23/2020 0935) Patient will transfer sit to/from stand: with minimal assist Goal: Pt Will Transfer Bed To Chair/Chair To Bed Outcome: Progressing Flowsheets (Taken 01/23/2020 0935) Pt will Transfer Bed to Chair/Chair to Bed: with min assist Goal: Pt Will Ambulate Outcome: Progressing Flowsheets (Taken 01/23/2020 0935) Pt will Ambulate:  25 feet  with minimal assist  with rolling walker   9:36 AM, 01/23/20 Lonell Grandchild, MPT Physical Therapist with [facility] 336 [phone] office [phone] mobile phone

## 2020-01-23 NOTE — ED Provider Notes (Signed)
[facility] EMERGENCY DEPARTMENT Provider Note   CSN: [id number] Arrival date & time: 01/23/20  0207   Time seen 2:28 AM  History Chief Complaint  Patient presents with  . Respiratory Distress   Level 5 caveat due to respiratory distress  Veronica Horton is a 59 y.o. female.  HPI   Patient states she started having trouble breathing last night.  She states she is coughing up clear mucus.  She denies fever, or chest pain.  She states her legs have been swelling.  She states she has had this before but she does not know what was wrong.  She states she smokes 1-1/2 packs/day and she uses oxygen at home 5 L/min nasal cannula.  Husband told triage nurse she took an extra dose of Lasix today.  Patient has not had the Covid vaccine  PCP Sharilyn Sites, MD   Past Medical History:  Diagnosis Date  . Bronchitis   . COPD (chronic obstructive pulmonary disease) ([place])   . Degenerative disc disease, lumbar   . Hiatal hernia   . Hypertension     Patient Active Problem List   Diagnosis Date Noted  . Pneumonia   . Aspiration pneumonitis ([place]) 01/17/2019  . Incisional hernia, without obstruction or gangrene   . COPD with acute exacerbation ([place]) 01/14/2019  . Small bowel obstruction ([place]) 01/13/2019  . COPD (chronic obstructive pulmonary disease) ([place])   . Hypertension   . Acute respiratory failure with hypoxia ([place])   . Polycythemia     Past Surgical History:  Procedure Laterality Date  . CHOLECYSTECTOMY    . degenerative bone disease    . INCISIONAL HERNIA REPAIR N/A 01/15/2019   Procedure: [name] HERNIORRHAPHY WITH MESH;  Surgeon: [name], MD;  Location: [facility];  Service: General;  Laterality: N/A;  . OMENTECTOMY N/A 01/15/2019   Procedure: OMENTECTOMY;  Surgeon: [name], MD;  Location: [facility];  Service: General;  Laterality: N/A;     OB History   No obstetric history on file.     Family History  Problem Relation Age of Onset  . Sudden Cardiac Death Neg  Hx     Social History   Tobacco Use  . Smoking status: Current Every Day Smoker    Packs/day: 1.00  . Smokeless tobacco: Never Used  Substance Use Topics  . Alcohol use: No  . Drug use: No  lives at home Lives with spouse  Home Medications Prior to Admission medications   Medication Sig Start Date End Date Taking? Authorizing Provider  albuterol (PROVENTIL) (2.5 MG/3ML) 0.083% nebulizer solution Take 2.5 mg by nebulization every 6 (six) hours as needed for wheezing or shortness of breath.    [provider]  albuterol (VENTOLIN HFA) 108 (90 Base) MCG/ACT inhaler Inhale 1-2 puffs into the lungs every 6 (six) hours as needed for wheezing or shortness of breath.    [provider]  budesonide-formoterol (SYMBICORT) 160-4.5 MCG/ACT inhaler Inhale 2 puffs into the lungs 2 (two) times daily. 01/30/19   [name], MD  cyclobenzaprine (FLEXERIL) 10 MG tablet Take 10 mg by mouth daily as needed for muscle spasms.    [provider]  folic acid (FOLVITE) 1 MG tablet Take 1 tablet (1 mg total) by mouth daily. 01/31/19   [name], MD  furosemide (LASIX) 40 MG tablet Take 1 tablet (40 mg total) by mouth daily. 01/30/19 05/30/19  [name], MD  gabapentin (NEURONTIN) 300 MG capsule Take 600 mg by mouth at bedtime.  12/30/18   [provider]  guaiFENesin (MUCINEX) 600 MG 12 hr tablet Take 1 tablet (600 mg total) by mouth 2 (two) times daily. 01/30/19   [name], MD  ibuprofen (ADVIL) 200 MG tablet Take 400 mg by mouth at bedtime.    [provider]  lisinopril (ZESTRIL) 20 MG tablet Take 1 tablet (20 mg total) by mouth daily. 01/31/19   [name], MD  predniSONE (DELTASONE) 20 MG tablet Take 2 tablets by mouth daily x3 days; then 1 tablet by mouth daily x3 days; then half tablet by mouth daily x3 days and stop prednisone. 01/30/19   [name], MD  saccharomyces boulardii (FLORASTOR) 250 MG capsule Take 1 capsule (250 mg total) by  mouth 2 (two) times daily. 01/30/19   [name], MD  vitamin B-12 (CYANOCOBALAMIN) 500 MCG tablet Take 1 tablet (500 mcg total) by mouth daily. 01/31/19   [name], MD    Allergies    Codeine and Penicillins  Review of Systems   Review of Systems  All other systems reviewed and are negative.   Physical Exam Updated Vital Signs BP (!) 146/61   Pulse 94   Temp 98.6 F (37 C) (Oral)   Resp 18   SpO2 91%   Physical Exam Vitals and nursing note reviewed.  Constitutional:      General: She is in acute distress.     Appearance: She is obese.  HENT:     Head: Normocephalic and atraumatic.     Right Ear: External ear normal.     Left Ear: External ear normal.     Mouth/Throat:     Comments: Patient is missing some of her front upper incisors, and makes her speech difficult to understand. Eyes:     Extraocular Movements: Extraocular movements intact.     Conjunctiva/sclera: Conjunctivae normal.     Pupils: Pupils are equal, round, and reactive to light.  Cardiovascular:     Rate and Rhythm: Normal rate and regular rhythm.     Pulses: Normal pulses.     Heart sounds: Normal heart sounds. No murmur heard.   Pulmonary:     Effort: Tachypnea, accessory muscle usage, prolonged expiration, respiratory distress and retractions present.     Breath sounds: Decreased breath sounds and wheezing present.  Abdominal:     General: There is distension.     Palpations: Abdomen is soft.  Musculoskeletal:        General: Swelling present.     Cervical back: Normal range of motion.     Right lower leg: Edema present.     Left lower leg: Edema present.  Skin:    General: Skin is warm and dry.     Capillary Refill: Capillary refill takes less than 2 seconds.     Comments: Patient has some hyperpigmentation of her lower extremities bilaterally between her ankle and the upper portion of her lower legs  Psychiatric:        Mood and Affect: Affect is flat.        Speech: Speech is  delayed and slurred.        Behavior: Behavior is slowed.     ED Results / Procedures / Treatments   Labs (all labs ordered are listed, but only abnormal results are displayed) Results for orders placed or performed during the hospital encounter of 01/23/20  Culture, blood (routine x 2)   Specimen: BLOOD  Result Value Ref Range   Specimen Description BLOOD RIGHT ANTECUBITAL  Special Requests      BOTTLES DRAWN AEROBIC AND ANAEROBIC Blood Culture adequate volume   Culture      NO GROWTH < 12 HOURS Performed at [facility], [address], [place], [place] [address]    Report Status PENDING   Culture, blood (routine x 2)   Specimen: BLOOD RIGHT HAND  Result Value Ref Range   Specimen Description BLOOD RIGHT HAND    Special Requests      BOTTLES DRAWN AEROBIC AND ANAEROBIC Blood Culture adequate volume   Culture      NO GROWTH < 12 HOURS Performed at [facility], [address], [place],  [address]    Report Status PENDING   Resp Panel by RT PCR (RSV, Flu A&B, Covid) - Nasopharyngeal Swab   Specimen: Nasopharyngeal Swab  Result Value Ref Range   SARS Coronavirus 2 by RT PCR NEGATIVE NEGATIVE   Influenza A by PCR NEGATIVE NEGATIVE   Influenza B by PCR NEGATIVE NEGATIVE   Respiratory Syncytial Virus by PCR NEGATIVE NEGATIVE  Comprehensive metabolic panel  Result Value Ref Range   Sodium 138 135 - 145 mmol/L   Potassium 4.0 3.5 - 5.1 mmol/L   Chloride 105 98 - 111 mmol/L   CO2 24 22 - 32 mmol/L   Glucose, Bld 86 70 - 99 mg/dL   BUN 13 6 - 20 mg/dL   Creatinine, Ser 0.92 0.44 - 1.00 mg/dL   Calcium 9.0 8.9 - 10.3 mg/dL   Total Protein 7.6 6.5 - 8.1 g/dL   Albumin 3.8 3.5 - 5.0 g/dL   AST 32 15 - 41 U/L   ALT 34 0 - 44 U/L   Alkaline Phosphatase 46 38 - 126 U/L   Total Bilirubin 0.5 0.3 - 1.2 mg/dL   GFR calc non Af Amer >60 >60 mL/min   GFR calc Af Amer >60 >60 mL/min   Anion gap 9 5 - 15  Brain natriuretic peptide  Result Value Ref Range   B Natriuretic  Peptide 50.0 0.0 - 100.0 pg/mL  CBC with Differential  Result Value Ref Range   WBC 10.1 4.0 - 10.5 K/uL   RBC 5.23 (H) 3.87 - 5.11 MIL/uL   Hemoglobin 16.3 (H) 12.0 - 15.0 g/dL   HCT 51.0 (H) 36 - 46 %   MCV 97.5 80.0 - 100.0 fL   MCH 31.2 26.0 - 34.0 pg   MCHC 32.0 30.0 - 36.0 g/dL   RDW 14.3 11.5 - 15.5 %   Platelets 201 150 - 400 K/uL   nRBC 0.0 0.0 - 0.2 %   Neutrophils Relative % 79 %   Neutro Abs 8.0 (H) 1.7 - 7.7 K/uL   Lymphocytes Relative 12 %   Lymphs Abs 1.2 0.7 - 4.0 K/uL   Monocytes Relative 9 %   Monocytes Absolute 0.9 0 - 1 K/uL   Eosinophils Relative 0 %   Eosinophils Absolute 0.0 0 - 0 K/uL   Basophils Relative 0 %   Basophils Absolute 0.0 0 - 0 K/uL   Immature Granulocytes 0 %   Abs Immature Granulocytes 0.03 0.00 - 0.07 K/uL  Blood gas, arterial (at [facility])  Result Value Ref Range   FIO2 36.00    Delivery systems NASAL CANNULA    pH, Arterial 7.340 (L) 7.35 - 7.45   pCO2 arterial 63.9 (H) 32 - 48 mmHg   pO2, Arterial 87.5 83 - 108 mmHg   Bicarbonate 29.4 (H) 20.0 - 28.0 mmol/L  Acid-Base Excess 7.8 (H) 0.0 - 2.0 mmol/L   O2 Saturation 96.1 %   Patient temperature 37.0    Collection site LEFT RADIAL    Drawn by [id number]    Allens test (pass/fail) PASS PASS  Blood gas, venous (at [facility] and [facility], not at [facility])  Result Value Ref Range   FIO2 92.00    pH, Ven 7.341 7.25 - 7.43   pCO2, Ven 65.1 (H) 44 - 60 mmHg   pO2, Ven 38.6 32 - 45 mmHg   Bicarbonate 29.2 (H) 20.0 - 28.0 mmol/L   Acid-Base Excess 8.5 (H) 0.0 - 2.0 mmol/L   O2 Saturation 70.9 %   Patient temperature 37.0    Collection site VENOUS    Drawn by 1528   Troponin I (High Sensitivity)  Result Value Ref Range   Troponin I (High Sensitivity) 36 (H) <18 ng/L  Troponin I (High Sensitivity)  Result Value Ref Range   Troponin I (High Sensitivity) 6 <18 ng/L   Laboratory interpretation all normal except mild respiratory acidosis, elevation of her hemoglobin consistent with chronic hypoxia or  dehydration    EKG EKG Interpretation  Date/Time:  Friday January 23 2020 02:36:00 EDT Ventricular Rate:  72 PR Interval:    QRS Duration: 109 QT Interval:  422 QTC Calculation: 462 R Axis:   79 Text Interpretation: Sinus rhythm Atrial premature complexes Borderline T abnormalities, diffuse leads No significant change since last tracing 15 Jan 2019 Confirmed by Rolland Porter [phone]) on 01/23/2020 3:15:51 AM   Radiology DG Chest Port 1 View  Result Date: 01/23/2020 CLINICAL DATA:  Initial evaluation for acute cough, shortness of breath. EXAM: PORTABLE CHEST 1 VIEW COMPARISON:  Prior radiograph from 01/24/2019. FINDINGS: Mild cardiomegaly, stable. Mediastinal silhouette within normal limits. Lungs hypoinflated. Streaky and patchy opacity at the left lung base could reflect atelectasis or infiltrate. Underlying coarsening of the interstitial markings, stable. Mild perihilar vascular congestion without overt pulmonary edema. No visible pleural effusion. No pneumothorax. No acute osseous finding. IMPRESSION: 1. Streaky and patchy opacity at the left lung base, which could reflect atelectasis or infiltrate. 2. Cardiomegaly with mild perihilar vascular congestion without overt pulmonary edema. Electronically Signed   By: Jeannine Boga M.D.   On: 01/23/2020 03:03    Procedures .Critical Care Performed by: Rolland Porter, MD Authorized by: Rolland Porter, MD   Critical care provider statement:    Critical care time (minutes):  40   Critical care was necessary to treat or prevent imminent or life-threatening deterioration of the following conditions:  Respiratory failure   Critical care was time spent personally by me on the following activities:  Discussions with consultants, examination of patient, obtaining history from patient or surrogate, ordering and performing treatments and interventions, ordering and review of laboratory studies, ordering and review of radiographic studies, pulse  oximetry, re-evaluation of patient's condition and review of old charts   (including critical care time)  Medications Ordered in ED Medications  albuterol (VENTOLIN HFA) 108 (90 Base) MCG/ACT inhaler 8 puff (8 puffs Inhalation Given 01/23/20 0247)  aerochamber Z-Stat Plus/medium 1 each (1 each Other Given 01/23/20 0256)  methylPREDNISolone sodium succinate (SOLU-MEDROL) 125 mg/2 mL injection 125 mg (125 mg Intravenous Given 01/23/20 0255)  levofloxacin (LEVAQUIN) IVPB 750 mg (0 mg Intravenous Stopped 01/23/20 0511)  albuterol (PROVENTIL,VENTOLIN) solution continuous neb (10 mg/hr Nebulization Given 01/23/20 0356)  ipratropium (ATROVENT) nebulizer solution 0.5 mg (0.5 mg Nebulization Given 01/23/20 0356)    ED Course  I have reviewed the  triage vital signs and the nursing notes.  Pertinent labs & imaging results that were available during my care of the patient were reviewed by me and considered in my medical decision making (see chart for details).    MDM Rules/Calculators/A&P                         Patient was given albuterol 8 puffs by inhaler.  She was given Solu-Medrol IV.  Blood cultures were drawn.  After reviewing her chest x-ray she was given Levophed 750 mg IV, patient has a penicillin allergy with hives.  Respiratory therapy took off the nonrebreather and placed patient back on nasal cannula oxygen.  When I review her last hospitalization she was discharged home with 6 L/min nasal cannula.  At that time she was felt to have an aspiration pneumonia.  They also thought she might have obstructive sleep apnea and recommended she get a sleep study which she has not had done.  Recheck at 3:36 AM patient is sleeping.  She is got her chin on her chest and she is making stridorous noise due to kinking of her airway.  I removed her pillow and her head was in a more natural position and that stridor went away.  Patient is hard to awaken.  She was given albuterol nebulizer, her Covid test is come  back negative.  We will repeat her VBG after the nebulizer.  Patient's respiratory rate is 18 with heart rate 112.  Pulse ox is 95% on 3 L/min nasal cannula.  4:40 AM patient had just finished her continuous nebulizer.  Her pulse ox is 97% on 3 L.  Respiratory rate is 20, heart rate is 111 and her blood pressure is 150/77.  Patient is snoring.  She has better air movement.  However she does not wake up to sternal rub.  Nurse reports she had awakened a short while before and complained of cramping in her leg.  Repeat VBG was ordered.  I cannot tell if patient is just sleepy because she is sleep deprived or if she is starting to retain CO2.  Patient's ABG is about the same.  When compared to her prior her pH is a little bit lower and her PCO2 is about 20 points higher.  Consideration was made for BiPAP but she is very somnolent.  Boyfriend states she has been somnolent for the last several days and falls asleep smoking her cigarettes.  He also states she has not done any follow-up since she was discharged from the hospital.  05:58 AM Dr Olevia Bowens, hospitalist, will admit.   Final Clinical Impression(s) / ED Diagnoses Final diagnoses:  COPD exacerbation ([place])  Respiratory distress  Community acquired pneumonia of left lower lobe of lung    Rx / DC Orders  Plan admission  Rolland Porter, MD, Barbette Or, MD 01/23/20 [id number]

## 2020-01-23 NOTE — ED Notes (Signed)
PT to assess pt.

## 2020-01-23 NOTE — Progress Notes (Signed)
Per HPI: Arpi [facility] a 59 y.o.femalewith medical history significant ofclass III obesity, hiatal hernia, hypertension, bronchitis, COPD, chronic respiratory failure on home oxygen at 5 LPM, active smoker of 1-1/2 packs of cigarettes per day who is brought to the emergency department via EMS due to progressively worse dyspnea for several days despite taking 2 extra diuretic tablets over the past 2 days. Patient could not provide much information as she would get tachypneic while talking and was unable to speak in full sentences. She denies headache, chest pain or abdominal pain at this time.  9/24: Patient has been admitted with acute COPD exacerbation in the setting of ongoing tobacco abuse.  She has been seen and evaluated at bedside and has been admitted after midnight.  She apparently wears 5 L nasal cannula oxygen intermittently at home.  She has been started on IV steroids and duo nebs will be added.  Her home blood pressure agents have been resumed.  She continues to have ongoing dyspnea.  PT evaluation with recommendation for SNF.  Continue monitoring closely with repeat labs in a.m.  Total CARE time: 25 minutes.

## 2020-01-23 NOTE — Evaluation (Signed)
Physical Therapy Evaluation Patient Details Name: Veronica Horton MRN: [id number] DOB: December 09, 1960 Today's Date: 01/23/2020   History of Present Illness  Veronica Horton is a 59 y.o. female with medical history significant of class III obesity, hiatal hernia, hypertension, bronchitis, COPD, chronic respiratory failure on home oxygen at 5 LPM, active smoker of 1-1/2 packs of cigarettes per day who is brought to the emergency department via EMS due to progressively worse dyspnea for several days despite taking 2 extra diuretic tablets over the past 2 days.  Patient could not provide much information as she would get tachypneic while talking and was unable to speak in full sentences.  She denies headache, chest pain or abdominal pain at this time.    Clinical Impression  Patient demonstrates slow labored movement for sitting up at bedside requiring Mod assist to pull self to sitting, limited to a few unsteady labored side steps at bedside due to weakness and fall risk and put back to bed after therapy.  Patient will benefit from continued physical therapy in hospital and recommended venue below to increase strength, balance, endurance for safe ADLs and gait.    Follow Up Recommendations SNF;Supervision - Intermittent;Supervision for mobility/OOB    Equipment Recommendations  3in1 (PT);Other (comment) (need large 3in 1)    Recommendations for Other Services       Precautions / Restrictions Precautions Precautions: Fall Restrictions Weight Bearing Restrictions: No      Mobility  Bed Mobility Overal bed mobility: Needs Assistance Bed Mobility: Supine to Sit;Sit to Supine     Supine to sit: Min assist;Mod assist Sit to supine: Mod assist      Transfers Overall transfer level: Needs assistance Equipment used: Rolling walker (2 wheeled) Transfers: Sit to/from [facility] Sit to Stand: Mod assist Stand pivot transfers: Mod assist       General transfer comment:  slow labored movement  Ambulation/Gait Ambulation/Gait assistance: Mod assist;Max assist Gait Distance (Feet): 4 Feet Assistive device: Rolling walker (2 wheeled) Gait Pattern/deviations: Decreased step length - right;Decreased step length - left;Decreased stride length Gait velocity: slow   General Gait Details: limited to 4-5 slow unsteady labored side steps at bedside secondary to weakness and fall risk  Stairs            Wheelchair Mobility    Modified Rankin (Stroke Patients Only)       Balance Overall balance assessment: Needs assistance Sitting-balance support: Bilateral upper extremity supported;Feet unsupported Sitting balance-Leahy Scale: Fair Sitting balance - Comments: seated at EOB   Standing balance support: Bilateral upper extremity supported;During functional activity Standing balance-Leahy Scale: Poor Standing balance comment: fair/poor using RW                             Pertinent Vitals/Pain Pain Assessment: No/denies pain    Home Living Family/patient expects to be discharged to:: Private residence Living Arrangements: Non-relatives/Friends Available Help at Discharge: Family;Friend(s);Available 24 hours/day Type of Home: Mobile home Home Access: Ramped entrance     Home Layout: One level Home Equipment: [place] - 2 wheels;Cane - single point      Prior Function Level of Independence: Independent with assistive device(s)         Comments: Household and short distanced community ambulator using [facility]     Hand Dominance        Extremity/Trunk Assessment   Upper Extremity Assessment Upper Extremity Assessment: Generalized weakness    Lower Extremity Assessment Lower  Extremity Assessment: Generalized weakness    Cervical / Trunk Assessment Cervical / Trunk Assessment: Kyphotic  Communication   Communication: No difficulties  Cognition Arousal/Alertness: Awake/alert Behavior During Therapy: WFL for tasks  assessed/performed Overall Cognitive Status: Within Functional Limits for tasks assessed                                        General Comments      Exercises     Assessment/Plan    PT Assessment Patient needs continued PT services  PT Problem List Decreased strength;Decreased activity tolerance;Decreased balance;Decreased mobility       PT Treatment Interventions DME instruction;Gait training;Stair training;Functional mobility training;Therapeutic activities;Therapeutic exercise;Patient/family education    PT Goals (Current goals can be found in the Care Plan section)  Acute Rehab PT Goals Patient Stated Goal: return home with family  PT Goal Formulation: With patient Time For Goal Achievement: 02/06/20 Potential to Achieve Goals: Good    Frequency Min 3X/week   Barriers to discharge        Co-evaluation               AM-PAC PT "6 Clicks" Mobility  Outcome Measure Help needed turning from your back to your side while in a flat bed without using bedrails?: A Lot Help needed moving from lying on your back to sitting on the side of a flat bed without using bedrails?: A Lot Help needed moving to and from a bed to a chair (including a wheelchair)?: A Lot Help needed standing up from a chair using your arms (e.g., wheelchair or bedside chair)?: A Lot Help needed to walk in hospital room?: A Lot Help needed climbing 3-5 steps with a railing? : Total 6 Click Score: 11    End of Session Equipment Utilized During Treatment: Oxygen Activity Tolerance: Patient tolerated treatment well;Patient limited by fatigue Patient left: in bed;with call bell/phone within reach Nurse Communication: Mobility status PT Visit Diagnosis: Unsteadiness on feet (R26.81);Other abnormalities of gait and mobility (R26.89);Muscle weakness (generalized) (M62.81)    Time: 2130-8657 PT Time Calculation (min) (ACUTE ONLY): 31 min   Charges:   PT Evaluation $PT Eval Moderate  Complexity: 1 Mod PT Treatments $Therapeutic Activity: 23-37 mins        9:31 AM, 01/23/20 Lonell Grandchild, MPT Physical Therapist with [facility] [phone] office [phone] mobile phone

## 2020-01-23 NOTE — H&P (Signed)
History and Physical    Veronica Horton QVZ:[id number] DOB: 27-Jul-1960 DOA: 01/23/2020  PCP: Sharilyn Sites, MD   Patient coming from: Home.  I have personally briefly reviewed patient's old medical records in [place]  Chief Complaint: Shortness of breath.  HPI: Veronica Horton is a 59 y.o. female with medical history significant of class III obesity, hiatal hernia, hypertension, bronchitis, COPD, chronic respiratory failure on home oxygen at 5 LPM, active smoker of 1-1/2 packs of cigarettes per day who is brought to the emergency department via EMS due to progressively worse dyspnea for several days despite taking 2 extra diuretic tablets over the past 2 days.  Patient could not provide much information as she would get tachypneic while talking and was unable to speak in full sentences.  She denies headache, chest pain or abdominal pain at this time.  ED Course: Initial vital signs were temperature 98.6 F, heart rate 101, respiration 17, blood pressure 155/84 mmHg O2 sat 94% O2 via [place] at 3 LPM.  The patient received 8 puffs of albuterol initially, followed by a continuous 10 mg neb treatment, Levaquin 750 mg IVP x1, Solu-Medrol 125 mg IVP x1.  Arterial blood's with an FiO2 36% showed a pH of 7.34, PCO2 of 64 and PO2 of 87.5 mmHg.  CBC is white count 10.1, hemoglobin 16.3 g/dL and platelets 201.  SARS and influenza PCR was negative.  BNP was 50.0 pg/mL.  Troponin was 36 and then 6 ng/L.  CMP was normal.  Venous blood gas showed similar numbers to the earlier ABG.  Please see full reports for further detail.  Imaging: A portable 1 view chest radiograph showed streaky and patchy opacity of the left lung base, which could reflect atelectasis or infiltrate.  Review of Systems: As per HPI otherwise all other systems reviewed and are negative.  Past Medical History:  Diagnosis Date  . Bronchitis   . Class 3 obesity 01/23/2020  . COPD (chronic obstructive pulmonary disease) ([place])   .  Degenerative disc disease, lumbar   . Hiatal hernia   . Hypertension    Past Surgical History:  Procedure Laterality Date  . CHOLECYSTECTOMY    . degenerative bone disease    . INCISIONAL HERNIA REPAIR N/A 01/15/2019   Procedure: [name] HERNIORRHAPHY WITH MESH;  Surgeon: [name], MD;  Location: [facility];  Service: General;  Laterality: N/A;  . OMENTECTOMY N/A 01/15/2019   Procedure: OMENTECTOMY;  Surgeon: [name], MD;  Location: [facility];  Service: General;  Laterality: N/A;   Social History  reports that she has been smoking. She has been smoking about 1.00 pack per day. She has never used smokeless tobacco. She reports that she does not drink alcohol and does not use drugs.  Allergies  Allergen Reactions  . Codeine Hives  . Penicillins Hives    Did it involve swelling of the face/tongue/throat, SOB, or low BP? No Did it involve sudden or severe rash/hives, skin peeling, or any reaction on the inside of your mouth or nose? Yes Did you need to seek medical attention at a hospital or doctor's office? Unknown When did it last happen?Over 10 years If all above answers are "NO", may proceed with cephalosporin use.    Family History  Problem Relation Age of Onset  . Hypertension Mother   . Sudden Cardiac Death Neg Hx    Prior to Admission medications   Medication Sig Start Date End Date Taking? Authorizing Provider  albuterol (PROVENTIL) (2.5 MG/3ML)  0.083% nebulizer solution Take 2.5 mg by nebulization every 6 (six) hours as needed for wheezing or shortness of breath.    [provider]  albuterol (VENTOLIN HFA) 108 (90 Base) MCG/ACT inhaler Inhale 1-2 puffs into the lungs every 6 (six) hours as needed for wheezing or shortness of breath.    [provider]  budesonide-formoterol (SYMBICORT) 160-4.5 MCG/ACT inhaler Inhale 2 puffs into the lungs 2 (two) times daily. 01/30/19   [name], MD  cyclobenzaprine (FLEXERIL) 10 MG tablet Take 10 mg by  mouth daily as needed for muscle spasms.    [provider]  folic acid (FOLVITE) 1 MG tablet Take 1 tablet (1 mg total) by mouth daily. 01/31/19   [name], MD  furosemide (LASIX) 40 MG tablet Take 1 tablet (40 mg total) by mouth daily. 01/30/19 05/30/19  [name], MD  gabapentin (NEURONTIN) 300 MG capsule Take 600 mg by mouth at bedtime.  12/30/18   [provider]  guaiFENesin (MUCINEX) 600 MG 12 hr tablet Take 1 tablet (600 mg total) by mouth 2 (two) times daily. 01/30/19   [name], MD  ibuprofen (ADVIL) 200 MG tablet Take 400 mg by mouth at bedtime.    [provider]  lisinopril (ZESTRIL) 20 MG tablet Take 1 tablet (20 mg total) by mouth daily. 01/31/19   [name], MD  predniSONE (DELTASONE) 20 MG tablet Take 2 tablets by mouth daily x3 days; then 1 tablet by mouth daily x3 days; then half tablet by mouth daily x3 days and stop prednisone. 01/30/19   [name], MD  saccharomyces boulardii (FLORASTOR) 250 MG capsule Take 1 capsule (250 mg total) by mouth 2 (two) times daily. 01/30/19   [name], MD  vitamin B-12 (CYANOCOBALAMIN) 500 MCG tablet Take 1 tablet (500 mcg total) by mouth daily. 01/31/19   [name], MD   Physical Exam: Vitals:   01/23/20 0430 01/23/20 0500 01/23/20 0530 01/23/20 0600  BP: (!) 152/77 (!) 143/95 (!) 146/61 (!) 144/86  Pulse: 88 92 94 85  Resp: 18 18 18 18   Temp:      TempSrc:      SpO2: 96% 91% 91% 91%   Constitutional: NAD, calm, comfortable Eyes: PERRL, lids and conjunctivae injected. ENMT: Mucous membranes are dry. Posterior pharynx clear of any exudate or lesions. Neck: normal, supple, no masses, no thyromegaly Respiratory: Decreased breath sounds with bilateral wheezing and rhonchi. No accessory muscle use.  Cardiovascular: Regular rate and rhythm, no murmurs / rubs / gallops. No extremity edema. 2+ pedal pulses. No carotid bruits.  Abdomen: Nondistended.  Obese. Bowel sounds positive. Soft, no  tenderness, no masses palpated. No hepatosplenomegaly.  Musculoskeletal: Generalized weakness.  No clubbing / cyanosis. Good ROM, no contractures. Normal muscle tone.  Skin: Ecchymosis on left shoulder.  Unable to fully evaluate. Neurologic: CN 2-12 grossly intact. Sensation intact, DTR normal. Strength 5/5 in all 4.  Psychiatric:  Alert and oriented x 3. Normal mood.   Labs on Admission: I have personally reviewed following labs and imaging studies  CBC: Recent Labs  Lab 01/23/20 0234  WBC 10.1  NEUTROABS 8.0*  HGB 16.3*  HCT 51.0*  MCV 97.5  PLT 914   Basic Metabolic Panel: Recent Labs  Lab 01/23/20 0234  NA 138  K 4.0  CL 105  CO2 24  GLUCOSE 86  BUN 13  CREATININE 0.92  CALCIUM 9.0   GFR: CrCl cannot be calculated (Unknown ideal weight.).  Liver Function Tests: Recent Labs  Lab 01/23/20 0234  AST 32  ALT 34  ALKPHOS 46  BILITOT 0.5  PROT 7.6  ALBUMIN 3.8   Radiological Exams on Admission: DG Chest Port 1 View  Result Date: 01/23/2020 CLINICAL DATA:  Initial evaluation for acute cough, shortness of breath. EXAM: PORTABLE CHEST 1 VIEW COMPARISON:  Prior radiograph from 01/24/2019. FINDINGS: Mild cardiomegaly, stable. Mediastinal silhouette within normal limits. Lungs hypoinflated. Streaky and patchy opacity at the left lung base could reflect atelectasis or infiltrate. Underlying coarsening of the interstitial markings, stable. Mild perihilar vascular congestion without overt pulmonary edema. No visible pleural effusion. No pneumothorax. No acute osseous finding. IMPRESSION: 1. Streaky and patchy opacity at the left lung base, which could reflect atelectasis or infiltrate. 2. Cardiomegaly with mild perihilar vascular congestion without overt pulmonary edema. Electronically Signed   By: Jeannine Boga M.D.   On: 01/23/2020 03:03   EKG: Independently reviewed. Vent. rate 72 BPM PR interval * ms QRS duration 109 ms QT/QTc 422/462 ms P-R-T axes 64 79  -41 Sinus rhythm Atrial premature complexes Borderline T abnormalities, diffuse leads  Assessment/Plan Principal Problem:   Acute respiratory failure with hypoxia and hypercapnia (HCC)   COPD with acute exacerbation (HCC) Observation/stepdown. Continue supplemental oxygen. BiPAP as needed. Continue bronchodilators. Continue Solu-Medrol 40 mg IVP every 6 hours. Start prednisone taper after methylprednisolone course. Smoking cessation was strongly advised. Has not done sleep study yet. Advised to reschedule her sleep polysomnography.  Active Problems:   Hypertension Apparently on lisinopril. We will need my reconciliation. Monitor blood pressure, renal function and electrolytes.    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 Smoking cessation suggested.    Class 3 obesity Needs significant lifestyle modifications.    Tobacco abuse Declined nicotine replacement therapy. Staff to provide tobacco cessation information.    DVT prophylaxis: Lovenox SQ. Code Status:   Full code. Family Communication:   Disposition Plan:   Patient is from:  Home.   Anticipated DC to:  Home.   Anticipated DC date:  01/23/2020  Anticipated DC barriers: Clinical status. Consults called:   Admission status:  SDU/Observation.   Severity of Illness: High due to acute on chronic respiratory failure with CO2 retention and worsening hypoxia.  Reubin Milan MD [facility]  How to contact the [facility] Attending or Consulting provider [place] or covering provider during after hours [place], for this patient?   1. Check the care team in [facility] and look for a) attending/consulting [facility] provider listed and b) the [facility] team listed 2. Log into [email or web address] and use [facility]'s universal password to access. If you do not have the password, please contact the hospital operator. 3. Locate the [facility] provider you are looking for under [facility] and page to a number that you can be directly reached. 4. If  you still have difficulty reaching the provider, please page the [facility] (Director on Call) for the Hospitalists listed on amion for assistance.  01/23/2020, 6:26 AM   This document was prepared using Dragon voice recognition software and may contain some unintended transcription errors.

## 2020-01-23 NOTE — ED Triage Notes (Signed)
Patient brought in POV for respiratory distress. Patient had wheezing and decreased lung sounds. Patient placed on Non-rebreather initially upon triage. Unable to gather much information from patient but patient's family member states patient took 2 fluid pills at home instead of her regular dose. Respiratory at bedside.

## 2020-01-24 DIAGNOSIS — J9622 Acute and chronic respiratory failure with hypercapnia: Secondary | ICD-10-CM | POA: Diagnosis present

## 2020-01-24 DIAGNOSIS — I10 Essential (primary) hypertension: Secondary | ICD-10-CM | POA: Diagnosis present

## 2020-01-24 DIAGNOSIS — F17210 Nicotine dependence, cigarettes, uncomplicated: Secondary | ICD-10-CM | POA: Diagnosis present

## 2020-01-24 DIAGNOSIS — Z20822 Contact with and (suspected) exposure to covid-19: Secondary | ICD-10-CM | POA: Diagnosis present

## 2020-01-24 DIAGNOSIS — Z8249 Family history of ischemic heart disease and other diseases of the circulatory system: Secondary | ICD-10-CM | POA: Diagnosis not present

## 2020-01-24 DIAGNOSIS — Z885 Allergy status to narcotic agent status: Secondary | ICD-10-CM | POA: Diagnosis not present

## 2020-01-24 DIAGNOSIS — J441 Chronic obstructive pulmonary disease with (acute) exacerbation: Secondary | ICD-10-CM | POA: Diagnosis present

## 2020-01-24 DIAGNOSIS — Z880 Allergy status to penicillin: Secondary | ICD-10-CM | POA: Diagnosis not present

## 2020-01-24 DIAGNOSIS — J9621 Acute and chronic respiratory failure with hypoxia: Secondary | ICD-10-CM | POA: Diagnosis present

## 2020-01-24 DIAGNOSIS — Z9981 Dependence on supplemental oxygen: Secondary | ICD-10-CM | POA: Diagnosis not present

## 2020-01-24 DIAGNOSIS — Z7951 Long term (current) use of inhaled steroids: Secondary | ICD-10-CM | POA: Diagnosis not present

## 2020-01-24 DIAGNOSIS — K449 Diaphragmatic hernia without obstruction or gangrene: Secondary | ICD-10-CM | POA: Diagnosis present

## 2020-01-24 DIAGNOSIS — D751 Secondary polycythemia: Secondary | ICD-10-CM | POA: Diagnosis present

## 2020-01-24 DIAGNOSIS — Z79899 Other long term (current) drug therapy: Secondary | ICD-10-CM | POA: Diagnosis not present

## 2020-01-24 DIAGNOSIS — Z6841 Body Mass Index (BMI) 40.0 and over, adult: Secondary | ICD-10-CM | POA: Diagnosis not present

## 2020-01-24 LAB — COMPREHENSIVE METABOLIC PANEL
ALT: 14 U/L (ref 0–44)
AST: 18 U/L (ref 15–41)
Albumin: 3.3 g/dL — ABNORMAL LOW (ref 3.5–5.0)
Alkaline Phosphatase: 71 U/L (ref 38–126)
Anion gap: 12 (ref 5–15)
BUN: 23 mg/dL — ABNORMAL HIGH (ref 6–20)
CO2: 33 mmol/L — ABNORMAL HIGH (ref 22–32)
Calcium: 9.3 mg/dL (ref 8.9–10.3)
Chloride: 92 mmol/L — ABNORMAL LOW (ref 98–111)
Creatinine, Ser: 0.94 mg/dL (ref 0.44–1.00)
GFR calc Af Amer: >60 mL/min (ref >60)
GFR calc non Af Amer: >60 mL/min (ref >60)
Glucose, Bld: 135 mg/dL — ABNORMAL HIGH (ref 70–99)
Potassium: 4 mmol/L (ref 3.5–5.1)
Sodium: 137 mmol/L (ref 135–145)
Total Bilirubin: 0.7 mg/dL (ref 0.3–1.2)
Total Protein: 7.5 g/dL (ref 6.5–8.1)

## 2020-01-24 LAB — CBC
HCT: 48.9 % — ABNORMAL HIGH (ref 36.0–46.0)
Hemoglobin: 15.6 g/dL — ABNORMAL HIGH (ref 12.0–15.0)
MCH: 31.6 pg (ref 26.0–34.0)
MCHC: 31.9 g/dL (ref 30.0–36.0)
MCV: 99 fL (ref 80.0–100.0)
Platelets: 215 10*3/uL (ref 150–400)
RBC: 4.94 MIL/uL (ref 3.87–5.11)
RDW: 14.1 % (ref 11.5–15.5)
WBC: 16.4 10*3/uL — ABNORMAL HIGH (ref 4.0–10.5)
nRBC: 0 % (ref 0.0–0.2)

## 2020-01-24 LAB — C-REACTIVE PROTEIN: CRP: 5 mg/dL — ABNORMAL HIGH (ref <1.0)

## 2020-01-24 LAB — SEDIMENTATION RATE: Sed Rate: 10 mm/hr (ref 0–22)

## 2020-01-24 LAB — MAGNESIUM: Magnesium: 2.6 mg/dL — ABNORMAL HIGH (ref 1.7–2.4)

## 2020-01-24 LAB — HIV ANTIBODY (ROUTINE TESTING W REFLEX): HIV Screen 4th Generation wRfx: NONREACTIVE

## 2020-01-24 MED ORDER — METHYLPREDNISOLONE SODIUM SUCC 40 MG IJ SOLR
40.0000 mg | Freq: Two times a day (BID) | INTRAMUSCULAR | Status: DC
  Administered 2020-01-24 – 2020-01-25 (×2): 40 mg via INTRAVENOUS
  Filled 2020-01-24 (×2): qty 1

## 2020-01-24 MED ORDER — INFLUENZA VAC SPLIT QUAD 0.5 ML IM SUSY: 0.5000 mL | PREFILLED_SYRINGE | INTRAMUSCULAR | Status: DC

## 2020-01-24 MED ORDER — ENSURE ENLIVE PO LIQD
237.0000 mL | Freq: Two times a day (BID) | ORAL | Status: DC
  Administered 2020-01-24: 237 mL via ORAL

## 2020-01-24 MED ORDER — ADULT MULTIVITAMIN W/MINERALS CH
1.0000 | ORAL_TABLET | Freq: Every day | ORAL | Status: DC
  Administered 2020-01-24 – 2020-01-25 (×2): 1 via ORAL
  Filled 2020-01-24 (×2): qty 1

## 2020-01-24 MED ORDER — IPRATROPIUM-ALBUTEROL 0.5-2.5 (3) MG/3ML IN SOLN
3.0000 mL | Freq: Four times a day (QID) | RESPIRATORY_TRACT | Status: DC | PRN
  Administered 2020-01-24 – 2020-01-25 (×2): 3 mL via RESPIRATORY_TRACT
  Filled 2020-01-24 (×3): qty 3

## 2020-01-24 NOTE — Progress Notes (Signed)
PROGRESS NOTE    Veronica Horton  LOV:[id number] DOB: 11/26/1960 DOA: 01/23/2020 PCP: Sharilyn Sites, MD   Brief Narrative:  Per HPI: Veronica Horton [facility] a 59 y.o.femalewith medical history significant ofclass III obesity, hiatal hernia, hypertension, bronchitis, COPD, chronic respiratory failure on home oxygen at 5 LPM, active smoker of 1-1/2 packs of cigarettes per day who is brought to the emergency department via EMS due to progressively worse dyspnea for several days despite taking 2 extra diuretic tablets over the past 2 days. Patient could not provide much information as she would get tachypneic while talking and was unable to speak in full sentences. She denies headache, chest pain or abdominal pain at this time.  9/24: Patient has been admitted with acute COPD exacerbation in the setting of ongoing tobacco abuse.  She has been seen and evaluated at bedside and has been admitted after midnight.  She apparently wears 5 L nasal cannula oxygen intermittently at home.  She has been started on IV steroids and duo nebs will be added.  Her home blood pressure agents have been resumed.  She continues to have ongoing dyspnea.  PT evaluation with recommendation for SNF.  Continue monitoring closely with repeat labs in a.m.  9/25: Patient continues to have some ongoing shortness of breath as well as some right-sided ear pain.  Her wheezing is improving however.  Assessment & Plan:   Principal Problem:   Acute respiratory failure with hypoxia and hypercapnia (HCC) Active Problems:   Hypertension   Polycythemia   COPD with acute exacerbation (HCC)   Class 3 obesity   Tobacco abuse   Acute on chronic hypoxemic respiratory failure secondary to COPD exacerbation -Apparently wears 5 L nasal cannula intermittently at home -Continue IV methylprednisolone 40 twice daily through today and then taper with prednisone -DuoNebs as needed for shortness of breath or wheezing -Plan to transfer to  telemetry today -Plan to have pulmonology follow-up outpatient  Hypertension-well-controlled -Continue lisinopril  Polycythemia -Secondary to chronic hypoxemia -Smoking cessation suggested  Obesity -Lifestyle modifications  Ongoing tobacco abuse -Counseled on cessation   DVT prophylaxis: Lovenox Code Status: Full code Family Communication: None at bedside Disposition Plan:  Status is: Observation  The patient will require care spanning > 2 midnights and should be moved to inpatient because: IV treatments appropriate due to intensity of illness or inability to take PO and Inpatient level of care appropriate due to severity of illness  Dispo: The patient is from: Home              Anticipated d/c is to: SNF              Anticipated d/c date is: 1 day              Patient currently is not medically stable to d/c.  Patient requires ongoing breathing treatments and IV steroids and is not quite at baseline with breathing.   Consultants:   None  Procedures:   None  Antimicrobials:  Anti-infectives (From admission, onward)   Start     Dose/Rate Route Frequency Ordered Stop   01/23/20 0345  levofloxacin (LEVAQUIN) IVPB 750 mg        750 mg 100 mL/hr over 90 Minutes Intravenous  Once 01/23/20 0332 01/23/20 0511       Subjective: Patient seen and evaluated today with some ongoing shortness of breath and minimal wheezing noted.  She denies any further coughing.  She states that she is slowly improving.  She does complain  of some right-sided ear pain as well as pain in her mouth, but this has been ongoing for quite some time.  Objective: Vitals:   01/24/20 0700 01/24/20 0805 01/24/20 0808 01/24/20 0826  BP: 125/65   (!) 149/74  Pulse: (!) 58  (!) 57   Resp:      Temp:   (!) 97.5 F (36.4 C)   TempSrc:   Oral   SpO2: 92% 90% 90%   Weight:      Height:        Intake/Output Summary (Last 24 hours) at 01/24/2020 0850 Last data filed at 01/23/2020 2051 Gross per 24  hour  Intake 240 ml  Output --  Net 240 ml   Filed Weights   01/23/20 1600 01/24/20 0400  Weight: 111.7 kg 111.8 kg    Examination:  General exam: Appears calm and comfortable, obese Respiratory system: Clear to auscultation. Respiratory effort normal.  Currently on 3 L nasal cannula oxygen. Cardiovascular system: S1 & S2 heard, RRR. No JVD, murmurs, rubs, gallops or clicks. No pedal edema. Gastrointestinal system: Abdomen is nondistended, soft and nontender.  Central nervous system: Alert and oriented. No focal neurological deficits. Extremities: No edema Skin: No rashes, lesions or ulcers Psychiatry: Judgement and insight appear normal. Mood & affect appropriate.     Data Reviewed: I have personally reviewed following labs and imaging studies  CBC: Recent Labs  Lab 01/23/20 0234 01/24/20 0425  WBC 10.1 16.4*  NEUTROABS 8.0*  --   HGB 16.3* 15.6*  HCT 51.0* 48.9*  MCV 97.5 99.0  PLT 201 956   Basic Metabolic Panel: Recent Labs  Lab 01/23/20 0234 01/23/20 0700 01/24/20 0425  NA 138  --  137  K 4.0  --  4.0  CL 105  --  92*  CO2 24  --  33*  GLUCOSE 86  --  135*  BUN 13  --  23*  CREATININE 0.92  --  0.94  CALCIUM 9.0  --  9.3  MG  --  2.3 2.6*  PHOS  --  4.4  --    GFR: Estimated Creatinine Clearance: 79.3 mL/min (by C-G formula based on SCr of 0.94 mg/dL). Liver Function Tests: Recent Labs  Lab 01/23/20 0234 01/24/20 0425  AST 32 18  ALT 34 14  ALKPHOS 46 71  BILITOT 0.5 0.7  PROT 7.6 7.5  ALBUMIN 3.8 3.3*   No results for input(s): LIPASE, AMYLASE in the last 168 hours. No results for input(s): AMMONIA in the last 168 hours. Coagulation Profile: No results for input(s): INR, PROTIME in the last 168 hours. Cardiac Enzymes: No results for input(s): CKTOTAL, CKMB, CKMBINDEX, TROPONINI in the last 168 hours. BNP (last 3 results) No results for input(s): PROBNP in the last 8760 hours. HbA1C: No results for input(s): HGBA1C in the last 72  hours. CBG: No results for input(s): GLUCAP in the last 168 hours. Lipid Profile: No results for input(s): CHOL, HDL, LDLCALC, TRIG, CHOLHDL, LDLDIRECT in the last 72 hours. Thyroid Function Tests: No results for input(s): TSH, T4TOTAL, FREET4, T3FREE, THYROIDAB in the last 72 hours. Anemia Panel: No results for input(s): VITAMINB12, FOLATE, FERRITIN, TIBC, IRON, RETICCTPCT in the last 72 hours. Sepsis Labs: Recent Labs  Lab 01/23/20 0700  PROCALCITON <0.10    Recent Results (from the past 240 hour(s))  Resp Panel by RT PCR (RSV, Flu A&B, Covid) - Nasopharyngeal Swab     Status: None   Collection Time: 01/23/20  2:35 AM  Specimen: Nasopharyngeal Swab  Result Value Ref Range Status   SARS Coronavirus 2 by RT PCR NEGATIVE NEGATIVE Final    Comment: (NOTE) SARS-CoV-2 target nucleic acids are NOT DETECTED.  The SARS-CoV-2 RNA is generally detectable in upper respiratoy specimens during the acute phase of infection. The lowest concentration of SARS-CoV-2 viral copies this assay can detect is 131 copies/mL. A negative result does not preclude SARS-Cov-2 infection and should not be used as the sole basis for treatment or other patient management decisions. A negative result may occur with  improper specimen collection/handling, submission of specimen other than nasopharyngeal swab, presence of viral mutation(s) within the areas targeted by this assay, and inadequate number of viral copies (<131 copies/mL). A negative result must be combined with clinical observations, patient history, and epidemiological information. The expected result is Negative.  Fact Sheet for Patients:  PinkCheek.be  Fact Sheet for Healthcare Providers:  GravelBags.it  This test is no t yet approved or cleared by the [place] FDA and  has been authorized for detection and/or diagnosis of SARS-CoV-2 by FDA under an Emergency Use Authorization  (EUA). This EUA will remain  in effect (meaning this test can be used) for the duration of the COVID-19 declaration under Section 564(b)(1) of the Act, 21 U.S.C. section 360bbb-3(b)(1), unless the authorization is terminated or revoked sooner.     Influenza A by PCR NEGATIVE NEGATIVE Final   Influenza B by PCR NEGATIVE NEGATIVE Final    Comment: (NOTE) The Xpert Xpress SARS-CoV-2/FLU/RSV assay is intended as an aid in  the diagnosis of influenza from Nasopharyngeal swab specimens and  should not be used as a sole basis for treatment. Nasal washings and  aspirates are unacceptable for Xpert Xpress SARS-CoV-2/FLU/RSV  testing.  Fact Sheet for Patients: PinkCheek.be  Fact Sheet for Healthcare Providers: GravelBags.it  This test is not yet approved or cleared by the [place] FDA and  has been authorized for detection and/or diagnosis of SARS-CoV-2 by  FDA under an Emergency Use Authorization (EUA). This EUA will remain  in effect (meaning this test can be used) for the duration of the  Covid-19 declaration under Section 564(b)(1) of the Act, 21  U.S.C. section 360bbb-3(b)(1), unless the authorization is  terminated or revoked.    Respiratory Syncytial Virus by PCR NEGATIVE NEGATIVE Final    Comment: (NOTE) Fact Sheet for Patients: PinkCheek.be  Fact Sheet for Healthcare Providers: GravelBags.it  This test is not yet approved or cleared by the [place] FDA and  has been authorized for detection and/or diagnosis of SARS-CoV-2 by  FDA under an Emergency Use Authorization (EUA). This EUA will remain  in effect (meaning this test can be used) for the duration of the  COVID-19 declaration under Section 564(b)(1) of the Act, 21 U.S.C.  section 360bbb-3(b)(1), unless the authorization is terminated or  revoked. Performed at [facility], [address]  [place], [place] [address]   Culture, blood (routine x 2)     Status: None (Preliminary result)   Collection Time: 01/23/20  2:54 AM   Specimen: BLOOD  Result Value Ref Range Status   Specimen Description BLOOD RIGHT ANTECUBITAL  Final   Special Requests   Final    BOTTLES DRAWN AEROBIC AND ANAEROBIC Blood Culture adequate volume   Culture   Final    NO GROWTH 1 DAY Performed at [facility], [address], [place], [place] [address]    Report Status PENDING  Incomplete  Culture, blood (routine x 2)  Status: None (Preliminary result)   Collection Time: 01/23/20  3:04 AM   Specimen: BLOOD RIGHT HAND  Result Value Ref Range Status   Specimen Description BLOOD RIGHT HAND  Final   Special Requests   Final    BOTTLES DRAWN AEROBIC AND ANAEROBIC Blood Culture adequate volume   Culture   Final    NO GROWTH 1 DAY Performed at [facility], [address] [place], [place] [address]    Report Status PENDING  Incomplete  MRSA PCR Screening     Status: None   Collection Time: 01/23/20 10:20 AM   Specimen: Nasal Mucosa; Nasopharyngeal  Result Value Ref Range Status   MRSA by PCR NEGATIVE NEGATIVE Final    Comment:        The GeneXpert MRSA Assay (FDA approved for NASAL specimens only), is one component of a comprehensive MRSA colonization surveillance program. It is not intended to diagnose MRSA infection nor to guide or monitor treatment for MRSA infections. Performed at [facility], [address], [place], [place] [address]          Radiology Studies: [facility] Chest [facility] 1 View  Result Date: 01/23/2020 CLINICAL DATA:  Initial evaluation for acute cough, shortness of breath. EXAM: PORTABLE CHEST 1 VIEW COMPARISON:  Prior radiograph from 01/24/2019. FINDINGS: Mild cardiomegaly, stable. Mediastinal silhouette within normal limits. Lungs hypoinflated. Streaky and patchy opacity at the left lung base could reflect atelectasis or infiltrate. Underlying coarsening of the interstitial  markings, stable. Mild perihilar vascular congestion without overt pulmonary edema. No visible pleural effusion. No pneumothorax. No acute osseous finding. IMPRESSION: 1. Streaky and patchy opacity at the left lung base, which could reflect atelectasis or infiltrate. 2. Cardiomegaly with mild perihilar vascular congestion without overt pulmonary edema. Electronically Signed   By: Jeannine Boga M.D.   On: 01/23/2020 03:03        Scheduled Meds: . Chlorhexidine Gluconate Cloth  6 each Topical Daily  . enoxaparin (LOVENOX) injection  40 mg Subcutaneous Q24H  . furosemide  40 mg Oral Daily  . gabapentin  600 mg Oral QHS  . [START ON 01/26/2020] influenza vac split quadrivalent PF  0.5 mL Intramuscular Tomorrow-1000  . lisinopril  20 mg Oral Daily  . methylPREDNISolone (SOLU-MEDROL) injection  40 mg Intravenous Q12H  . nicotine  7 mg Transdermal Daily    LOS: 0 days    Time spent: 30 minutes    Kenlei Safi Darleen Crocker, DO [facility]  If 7PM-7AM, please contact night-coverage [email or web address] 01/24/2020, 8:50 AM

## 2020-01-24 NOTE — Progress Notes (Signed)
Initial Nutrition Assessment  DOCUMENTATION CODES:   Morbid obesity  INTERVENTION:  Ensure Enlive po BID, each supplement provides 350 kcal and 20 grams of protein  MVI with minerals    NUTRITION DIAGNOSIS:   Increased nutrient needs related to acute illness, chronic illness (worsening dyspnea d/t acute on chronic respiratory failure; COPD) as evidenced by estimated needs.    GOAL:   Patient will meet greater than or equal to 90% of their needs    MONITOR:   Labs, Supplement acceptance, PO intake, Weight trends, I & O's  REASON FOR ASSESSMENT:   Consult Assessment of nutrition requirement/status  ASSESSMENT:  RD working remotely.  59 year old female with history of obesity, hiatal hernia, bronchitis, HTN, COPD, chronic respiratory failure on 5L nightly, active smoker presented due to progressively worsening dyspnea over the past several days despite taking 2 extra diuretic tablets x 2 days.  9/25 admitted to ICU with acute respiratory failure with hypoxia and hypercapnia   Per notes, wheezing is improving, she continues to have ongoing shortness of breath as well as right-sided ear pain today, plans to transfer to telemetry today. No documented intakes at this time, suspect she has experienced decreased appetite/intake over the past few days given persistent shortness of breath. She is ordered a therapeutic HH diet which restricts protein. Will order Ensure supplement and daily MVI to aid with meeting needs.  No recent wt history for review, per chart review pt was admitted to hospital in 09/20 for small bowl obstruction and weighed 135.4 kg (297.88 lb) on 01/29/19. This indicates ~52 lb (17%) wt loss in the last year; insignificant for time frame. Moderate pitting BLE edema noted.  Medications reviewed and include: Gabapentin, Methylprednisolone  Labs: BUN 23 (H), Mg 2.6 (H), WBC 16.4 (H), Hgb 15.6 (H), HCT 48.9 (H)   NUTRITION - FOCUSED PHYSICAL EXAM: Unable to  complete at this time, RD working remotely.    Diet Order:   Diet Order            Diet Heart Room service appropriate? Yes; Fluid consistency: Thin  Diet effective now                 EDUCATION NEEDS:   Not appropriate for education at this time  Skin:  Skin Assessment: Reviewed RN Assessment  Last BM:  9/22  Height:   Ht Readings from Last 1 Encounters:  01/23/20 5\' 5"  (1.651 m)    Weight:   Wt Readings from Last 1 Encounters:  01/24/20 111.8 kg    Ideal Body Weight:  56.8 kg  BMI:  Body mass index is 41.02 kg/m.  Estimated Nutritional Needs:   Kcal:  2032-2200 (MSJ x 1.2-1.3)  Protein:  100-110  Fluid:  >/= 2 L   Lajuan Lines, RD, LDN Clinical Nutrition After Hours/Weekend Pager # in [phone]

## 2020-01-24 NOTE — Progress Notes (Signed)
Patient complains of pressure and pain in right eat. Patient states she would like to have her ear assessed. She reports "lumps" in her neck and cheeks that are painful to touch. She desires to have a doctor assess these concerning areas.

## 2020-01-24 NOTE — TOC Progression Note (Signed)
Transition of Care [facility]) - Progression Note    Patient Details  Name: Veronica Horton MRN: [id number] Date of Birth: [birth year]  Transition of Care [facility]) CM/SW Contact  [name], LCSW Phone Number: 01/24/2020, 5:55 PM  Clinical Narrative:    CSW received SNF consult for patient. Patient is uninsured. Patient reported that she applied for medicaid earlier this year with the help of [facility] [employer] counselor, but was only given family planning and denied for additional healthcare benefits. Patient reports that she has not had any change in income or health status/diagnosis since applying for medicaid. CSW discussed [place] options with patient. Patient agreeable to [facility] CSW contacted [facility], [facility], [facility], [facility], [place], and [facility]. All HH agencies declined patient because of her location in [place] and her insurance status. CSW contacted on call supervisor [name] for assistance. [name] agreeable LOG for patient with given information. The above stated [facility] agencies reported that they are not accepting LOG's. CSW contacted [facility] and [facility] to inquire if they would take a patient with an LOG. [name] with [facility] reported that corporate has not been accepting LOG's and if they did, they would require 3 bank statements, W2's, and addition financial and health records before accepting patient and that they would have to be medicaid pending. [name] with [facility] and [facility] reported that [facility] would require a discharge plan  already in place before accepting patient, but would consider taking an LOG. Patient's sister reported that patient would only be agreeable to [facility] if she agreed to SNF because she would be close to family. CSW contacted [name] to inquire if she would be agreeable to a LOG for SNF. [name] reported that she would agree to a SNF LOG. CSW discussed [facility] referral with patient. Patient refused SNF. CSW discussed with patient that [facility] agencies would not be able to  provide services. Patient also reported that she would not be able to do OPPT because of a lack of transportation and ability to get to a vehicle for transportation without assistance. CSW discussed with patient that she would have to discharge without services if she declined SNF option. Patient stated that she would "go home and take care of herself. And would come back here to the hospital if she needed to." CSW discussed health risk and cost of hospitalization without insurance with patient. Patient continues to decline SNF. TOC to follow.         Expected Discharge Plan and Services                                                 Social Determinants of Health (SDOH) Interventions    Readmission Risk Interventions Readmission Risk Prevention Plan 01/20/2019  Medication Screening Complete  Transportation Screening Complete  Some recent data might be hidden

## 2020-01-25 LAB — CBC
HCT: 46.1 % — ABNORMAL HIGH (ref 36.0–46.0)
Hemoglobin: 14.5 g/dL (ref 12.0–15.0)
MCH: 31.5 pg (ref 26.0–34.0)
MCHC: 31.5 g/dL (ref 30.0–36.0)
MCV: 100 fL (ref 80.0–100.0)
Platelets: 232 10*3/uL (ref 150–400)
RBC: 4.61 MIL/uL (ref 3.87–5.11)
RDW: 14.1 % (ref 11.5–15.5)
WBC: 17.2 10*3/uL — ABNORMAL HIGH (ref 4.0–10.5)
nRBC: 0 % (ref 0.0–0.2)

## 2020-01-25 LAB — SEDIMENTATION RATE: Sed Rate: 7 mm/hr (ref 0–22)

## 2020-01-25 LAB — C-REACTIVE PROTEIN: CRP: 1.5 mg/dL — ABNORMAL HIGH (ref <1.0)

## 2020-01-25 MED ORDER — DM-GUAIFENESIN ER 30-600 MG PO TB12: 1.0000 | ORAL_TABLET | Freq: Two times a day (BID) | ORAL | 0 refills | Status: AC

## 2020-01-25 MED ORDER — NICOTINE 7 MG/24HR TD PT24: 7.0000 mg | MEDICATED_PATCH | Freq: Every day | TRANSDERMAL | 0 refills | Status: DC

## 2020-01-25 MED ORDER — PREDNISONE 20 MG PO TABS: 40.0000 mg | ORAL_TABLET | Freq: Every day | ORAL | 0 refills | Status: AC

## 2020-01-25 MED ORDER — ENSURE ENLIVE PO LIQD: 237.0000 mL | Freq: Two times a day (BID) | ORAL | 12 refills | Status: DC

## 2020-01-25 MED ORDER — AZITHROMYCIN 250 MG PO TABS: ORAL_TABLET | ORAL | 0 refills | Status: AC

## 2020-01-25 NOTE — Discharge Summary (Signed)
Physician Discharge Summary  Veronica Horton XTK:[id number] DOB: Nov 30, 1960 DOA: 01/23/2020  PCP: Veronica Sites, MD  Admit date: 01/23/2020  Discharge date: 01/25/2020  Admitted From:Home  Disposition:  Home; refused SNF  Recommendations for Outpatient Follow-up:  1. Follow up with PCP in 1-2 weeks 2. Follow-up with pulmonology which will be scheduled 3. Please obtain BMP/CBC in one week 4. Continue on prednisone as prescribed for 5 more days 5. Continue breathing treatments at home with nebulizer as needed 6. Continue on Mucinex as prescribed 7. Continue on azithromycin as prescribed to finish course of treatment 8. Nicotine patches provided to assist with tobacco cessation and patient counseled on smoking cessation  Home Health: None  Equipment/Devices: Has home walker and oxygen 5 L nasal cannula  Discharge Condition: Stable  CODE STATUS: Full  Diet recommendation: Heart Healthy  Brief/Interim Summary: Per HPI: Veronica Horton a 59 y.o.femalewith medical history significant ofclass III obesity, hiatal hernia, hypertension, bronchitis, COPD, chronic respiratory failure on home oxygen at 5 LPM, active smoker of 1-1/2 packs of cigarettes per day who is brought to the emergency department via EMS due to progressively worse dyspnea for several days despite taking 2 extra diuretic tablets over the past 2 days. Patient could not provide much information as she would get tachypneic while talking and was unable to speak in full sentences. She denies headache, chest pain or abdominal pain at this time.  9/24:Patient has been admitted with acute COPD exacerbation in the setting of ongoing tobacco abuse. She has been seen and evaluated at bedside and has been admitted after midnight. She apparently wears 5 L nasal cannula oxygen intermittently at home. She has been started on IV steroids and duo nebs will be added. Her home blood pressure agents have been resumed. She continues  to have ongoing dyspnea. PT evaluation with recommendation for SNF. Continue monitoring closely with repeat labs in a.m.  9/25: Patient continues to have some ongoing shortness of breath as well as some right-sided ear pain.  Her wheezing is improving however.  9/26: Patient denies any symptomatic complaints or concerns and is overall feeling back to her usual baseline.  She remains on 5 L nasal cannula oxygen which is her baseline requirement.  She continues to have a congested cough, but states that this is usually the case with her when she has COPD exacerbations and she feels comfortable going home.  She has been seen by physical therapy with recommendation for SNF, but she declines this as well as any home health.  She has been counseled on smoking cessation as well as the need for pulmonology consultation in the near future.  No other acute events noted throughout the course of this admission.  She does have some leukocytosis, but this is attributed to her steroid use.  Discharge Diagnoses:  Principal Problem:   Acute respiratory failure with hypoxia and hypercapnia (HCC) Active Problems:   Hypertension   Polycythemia   COPD with acute exacerbation (HCC)   Class 3 obesity   Tobacco abuse   Acute exacerbation of chronic obstructive pulmonary disease (COPD) ([place])  Principal discharge diagnosis: Acute on chronic hypoxemic respiratory failure secondary to COPD exacerbation in the setting of ongoing tobacco abuse.  Discharge Instructions  Discharge Instructions    Diet - low sodium heart healthy   Complete by: As directed    Increase activity slowly   Complete by: As directed      Allergies as of 01/25/2020      Reactions  Codeine Hives   Penicillins Hives   Did it involve swelling of the face/tongue/throat, SOB, or low BP? No Did it involve sudden or severe rash/hives, skin peeling, or any reaction on the inside of your mouth or nose? Yes Did you need to seek medical attention  at a hospital or doctor's office? Unknown When did it last happen?Over 10 years If all above answers are NO, may proceed with cephalosporin use.      Medication List    STOP taking these medications   guaiFENesin 600 MG 12 hr tablet Commonly known as: MUCINEX     TAKE these medications   albuterol 108 (90 Base) MCG/ACT inhaler Commonly known as: VENTOLIN HFA Inhale 1-2 puffs into the lungs every 6 (six) hours as needed for wheezing or shortness of breath.   albuterol (2.5 MG/3ML) 0.083% nebulizer solution Commonly known as: PROVENTIL Take 2.5 mg by nebulization every 6 (six) hours as needed for wheezing or shortness of breath.   azithromycin 250 MG tablet Commonly known as: Zithromax Z-Pak Take 2 tablets (500 mg) on  Day 1,  followed by 1 tablet (250 mg) once daily on Days 2 through 5.   budesonide-formoterol 160-4.5 MCG/ACT inhaler Commonly known as: Symbicort Inhale 2 puffs into the lungs 2 (two) times daily.   cyclobenzaprine 10 MG tablet Commonly known as: FLEXERIL Take 10 mg by mouth daily as needed for muscle spasms.   dextromethorphan-guaiFENesin 30-600 MG 12hr tablet Commonly known as: MUCINEX DM Take 1 tablet by mouth 2 (two) times daily for 10 days.   feeding supplement (ENSURE ENLIVE) Liqd Take 237 mLs by mouth 2 (two) times daily between meals.   folic acid 1 MG tablet Commonly known as: FOLVITE Take 1 tablet (1 mg total) by mouth daily.   furosemide 40 MG tablet Commonly known as: Lasix Take 1 tablet (40 mg total) by mouth daily.   gabapentin 300 MG capsule Commonly known as: NEURONTIN Take 600 mg by mouth at bedtime.   lisinopril 20 MG tablet Commonly known as: ZESTRIL Take 1 tablet (20 mg total) by mouth daily.   nicotine 7 mg/24hr patch Commonly known as: NICODERM CQ - dosed in mg/24 hr Place 1 patch (7 mg total) onto the skin daily. Start taking on: January 26, 2020   predniSONE 20 MG tablet Commonly known as: Deltasone Take 2  tablets (40 mg total) by mouth daily for 5 days. What changed:   how much to take  how to take this  when to take this  additional instructions   saccharomyces boulardii 250 MG capsule Commonly known as: FLORASTOR Take 1 capsule (250 mg total) by mouth 2 (two) times daily.   vitamin B-12 500 MCG tablet Commonly known as: CYANOCOBALAMIN Take 1 tablet (500 mcg total) by mouth daily.       Follow-up Information    Veronica Sites, MD Follow up in 1 week(s).   Specialty: Family Medicine Contact information: [address] [place] [address] [phone]              Allergies  Allergen Reactions   Codeine Hives   Penicillins Hives    Did it involve swelling of the face/tongue/throat, SOB, or low BP? No Did it involve sudden or severe rash/hives, skin peeling, or any reaction on the inside of your mouth or nose? Yes Did you need to seek medical attention at a hospital or doctor's office? Unknown When did it last happen?Over 10 years If all above answers are NO, may  proceed with cephalosporin use.     Consultations:  None   Procedures/Studies: DG Chest Port 1 View  Result Date: 01/23/2020 CLINICAL DATA:  Initial evaluation for acute cough, shortness of breath. EXAM: PORTABLE CHEST 1 VIEW COMPARISON:  Prior radiograph from 01/24/2019. FINDINGS: Mild cardiomegaly, stable. Mediastinal silhouette within normal limits. Lungs hypoinflated. Streaky and patchy opacity at the left lung base could reflect atelectasis or infiltrate. Underlying coarsening of the interstitial markings, stable. Mild perihilar vascular congestion without overt pulmonary edema. No visible pleural effusion. No pneumothorax. No acute osseous finding. IMPRESSION: 1. Streaky and patchy opacity at the left lung base, which could reflect atelectasis or infiltrate. 2. Cardiomegaly with mild perihilar vascular congestion without overt pulmonary edema. Electronically Signed   By: Jeannine Boga M.D.   On: 01/23/2020 03:03     Discharge Exam: Vitals:   01/25/20 0734 01/25/20 0744  BP: (!) 103/58   Pulse:    Resp:    Temp:    SpO2:  92%   Vitals:   01/25/20 0500 01/25/20 0700 01/25/20 0734 01/25/20 0744  BP:  (!) 103/58 (!) 103/58   Pulse:  (!) 49    Resp:  17    Temp: 97.9 F (36.6 C)     TempSrc: Oral     SpO2:  95%  92%  Weight:      Height:        General: Pt is alert, awake, not in acute distress, obese Cardiovascular: RRR, S1/S2 +, no rubs, no gallops Respiratory: CTA bilaterally, no wheezing, no rhonchi, on 5 L nasal cannula oxygen Abdominal: Soft, NT, ND, bowel sounds + Extremities: no edema, no cyanosis    The results of significant diagnostics from this hospitalization (including imaging, microbiology, ancillary and laboratory) are listed below for reference.     Microbiology: Recent Results (from the past 240 hour(s))  Resp Panel by RT PCR (RSV, Flu A&B, Covid) - Nasopharyngeal Swab     Status: None   Collection Time: 01/23/20  2:35 AM   Specimen: Nasopharyngeal Swab  Result Value Ref Range Status   SARS Coronavirus 2 by RT PCR NEGATIVE NEGATIVE Final    Comment: (NOTE) SARS-CoV-2 target nucleic acids are NOT DETECTED.  The SARS-CoV-2 RNA is generally detectable in upper respiratoy specimens during the acute phase of infection. The lowest concentration of SARS-CoV-2 viral copies this assay can detect is 131 copies/mL. A negative result does not preclude SARS-Cov-2 infection and should not be used as the sole basis for treatment or other patient management decisions. A negative result may occur with  improper specimen collection/handling, submission of specimen other than nasopharyngeal swab, presence of viral mutation(s) within the areas targeted by this assay, and inadequate number of viral copies (<131 copies/mL). A negative result must be combined with clinical observations, patient history, and epidemiological information.  The expected result is Negative.  Fact Sheet for Patients:  PinkCheek.be  Fact Sheet for Healthcare Providers:  GravelBags.it  This test is no t yet approved or cleared by the [place] FDA and  has been authorized for detection and/or diagnosis of SARS-CoV-2 by FDA under an Emergency Use Authorization (EUA). This EUA will remain  in effect (meaning this test can be used) for the duration of the COVID-19 declaration under Section 564(b)(1) of the Act, 21 U.S.C. section 360bbb-3(b)(1), unless the authorization is terminated or revoked sooner.     Influenza A by PCR NEGATIVE NEGATIVE Final   Influenza B by PCR NEGATIVE NEGATIVE Final  Comment: (NOTE) The Xpert Xpress SARS-CoV-2/FLU/RSV assay is intended as an aid in  the diagnosis of influenza from Nasopharyngeal swab specimens and  should not be used as a sole basis for treatment. Nasal washings and  aspirates are unacceptable for Xpert Xpress SARS-CoV-2/FLU/RSV  testing.  Fact Sheet for Patients: PinkCheek.be  Fact Sheet for Healthcare Providers: GravelBags.it  This test is not yet approved or cleared by the [place] FDA and  has been authorized for detection and/or diagnosis of SARS-CoV-2 by  FDA under an Emergency Use Authorization (EUA). This EUA will remain  in effect (meaning this test can be used) for the duration of the  Covid-19 declaration under Section 564(b)(1) of the Act, 21  U.S.C. section 360bbb-3(b)(1), unless the authorization is  terminated or revoked.    Respiratory Syncytial Virus by PCR NEGATIVE NEGATIVE Final    Comment: (NOTE) Fact Sheet for Patients: PinkCheek.be  Fact Sheet for Healthcare Providers: GravelBags.it  This test is not yet approved or cleared by the [place] FDA and  has been authorized for  detection and/or diagnosis of SARS-CoV-2 by  FDA under an Emergency Use Authorization (EUA). This EUA will remain  in effect (meaning this test can be used) for the duration of the  COVID-19 declaration under Section 564(b)(1) of the Act, 21 U.S.C.  section 360bbb-3(b)(1), unless the authorization is terminated or  revoked. Performed at [facility], [address] [place], [place] [address]   Culture, blood (routine x 2)     Status: None (Preliminary result)   Collection Time: 01/23/20  2:54 AM   Specimen: BLOOD  Result Value Ref Range Status   Specimen Description BLOOD RIGHT ANTECUBITAL  Final   Special Requests   Final    BOTTLES DRAWN AEROBIC AND ANAEROBIC Blood Culture adequate volume   Culture   Final    NO GROWTH 1 DAY Performed at [facility], [address], [place], [place] [address]    Report Status PENDING  Incomplete  Culture, blood (routine x 2)     Status: None (Preliminary result)   Collection Time: 01/23/20  3:04 AM   Specimen: BLOOD RIGHT HAND  Result Value Ref Range Status   Specimen Description BLOOD RIGHT HAND  Final   Special Requests   Final    BOTTLES DRAWN AEROBIC AND ANAEROBIC Blood Culture adequate volume   Culture   Final    NO GROWTH 1 DAY Performed at [facility], [address], [place], [place] [address]    Report Status PENDING  Incomplete  MRSA PCR Screening     Status: None   Collection Time: 01/23/20 10:20 AM   Specimen: Nasal Mucosa; Nasopharyngeal  Result Value Ref Range Status   MRSA by PCR NEGATIVE NEGATIVE Final    Comment:        The GeneXpert MRSA Assay (FDA approved for NASAL specimens only), is one component of a comprehensive MRSA colonization surveillance program. It is not intended to diagnose MRSA infection nor to guide or monitor treatment for MRSA infections. Performed at [facility], [address] [place], [place] [address]      Labs: BNP (last 3 results) Recent Labs    01/23/20 0234  BNP 50.9   Basic  Metabolic Panel: Recent Labs  Lab 01/23/20 0234 01/23/20 0700 01/24/20 0425  NA 138  --  137  K 4.0  --  4.0  CL 105  --  92*  CO2 24  --  33*  GLUCOSE 86  --  135*  BUN 13  --  23*  CREATININE 0.92  --  0.94  CALCIUM 9.0  --  9.3  MG  --  2.3 2.6*  PHOS  --  4.4  --    Liver Function Tests: Recent Labs  Lab 01/23/20 0234 01/24/20 0425  AST 32 18  ALT 34 14  ALKPHOS 46 71  BILITOT 0.5 0.7  PROT 7.6 7.5  ALBUMIN 3.8 3.3*   No results for input(s): LIPASE, AMYLASE in the last 168 hours. No results for input(s): AMMONIA in the last 168 hours. CBC: Recent Labs  Lab 01/23/20 0234 01/24/20 0425 01/25/20 0629  WBC 10.1 16.4* 17.2*  NEUTROABS 8.0*  --   --   HGB 16.3* 15.6* 14.5  HCT 51.0* 48.9* 46.1*  MCV 97.5 99.0 100.0  PLT 201 215 232   Cardiac Enzymes: No results for input(s): CKTOTAL, CKMB, CKMBINDEX, TROPONINI in the last 168 hours. BNP: Invalid input(s): POCBNP CBG: No results for input(s): GLUCAP in the last 168 hours. D-Dimer No results for input(s): DDIMER in the last 72 hours. Hgb A1c No results for input(s): HGBA1C in the last 72 hours. Lipid Profile No results for input(s): CHOL, HDL, LDLCALC, TRIG, CHOLHDL, LDLDIRECT in the last 72 hours. Thyroid function studies No results for input(s): TSH, T4TOTAL, T3FREE, THYROIDAB in the last 72 hours.  Invalid input(s): FREET3 Anemia work up No results for input(s): VITAMINB12, FOLATE, FERRITIN, TIBC, IRON, RETICCTPCT in the last 72 hours. Urinalysis    Component Value Date/Time   COLORURINE YELLOW 01/13/2019 1730   APPEARANCEUR CLOUDY (A) 01/13/2019 1730   LABSPEC 1.017 01/13/2019 1730   PHURINE 8.0 01/13/2019 1730   GLUCOSEU NEGATIVE 01/13/2019 1730   HGBUR NEGATIVE 01/13/2019 1730   BILIRUBINUR NEGATIVE 01/13/2019 1730   KETONESUR NEGATIVE 01/13/2019 1730   PROTEINUR 100 (A) 01/13/2019 1730   UROBILINOGEN 0.2 09/25/2010 0530   NITRITE NEGATIVE 01/13/2019 1730   LEUKOCYTESUR NEGATIVE  01/13/2019 1730   Sepsis Labs Invalid input(s): PROCALCITONIN,  WBC,  LACTICIDVEN Microbiology Recent Results (from the past 240 hour(s))  Resp Panel by RT PCR (RSV, Flu A&B, Covid) - Nasopharyngeal Swab     Status: None   Collection Time: 01/23/20  2:35 AM   Specimen: Nasopharyngeal Swab  Result Value Ref Range Status   SARS Coronavirus 2 by RT PCR NEGATIVE NEGATIVE Final    Comment: (NOTE) SARS-CoV-2 target nucleic acids are NOT DETECTED.  The SARS-CoV-2 RNA is generally detectable in upper respiratoy specimens during the acute phase of infection. The lowest concentration of SARS-CoV-2 viral copies this assay can detect is 131 copies/mL. A negative result does not preclude SARS-Cov-2 infection and should not be used as the sole basis for treatment or other patient management decisions. A negative result may occur with  improper specimen collection/handling, submission of specimen other than nasopharyngeal swab, presence of viral mutation(s) within the areas targeted by this assay, and inadequate number of viral copies (<131 copies/mL). A negative result must be combined with clinical observations, patient history, and epidemiological information. The expected result is Negative.  Fact Sheet for Patients:  PinkCheek.be  Fact Sheet for Healthcare Providers:  GravelBags.it  This test is no t yet approved or cleared by the [place] FDA and  has been authorized for detection and/or diagnosis of SARS-CoV-2 by FDA under an Emergency Use Authorization (EUA). This EUA will remain  in effect (meaning this test can be used) for the duration of the COVID-19 declaration under Section 564(b)(1) of the Act,  21 U.S.C. section 360bbb-3(b)(1), unless the authorization is terminated or revoked sooner.     Influenza A by PCR NEGATIVE NEGATIVE Final   Influenza B by PCR NEGATIVE NEGATIVE Final    Comment: (NOTE) The Xpert Xpress  SARS-CoV-2/FLU/RSV assay is intended as an aid in  the diagnosis of influenza from Nasopharyngeal swab specimens and  should not be used as a sole basis for treatment. Nasal washings and  aspirates are unacceptable for Xpert Xpress SARS-CoV-2/FLU/RSV  testing.  Fact Sheet for Patients: PinkCheek.be  Fact Sheet for Healthcare Providers: GravelBags.it  This test is not yet approved or cleared by the [place] FDA and  has been authorized for detection and/or diagnosis of SARS-CoV-2 by  FDA under an Emergency Use Authorization (EUA). This EUA will remain  in effect (meaning this test can be used) for the duration of the  Covid-19 declaration under Section 564(b)(1) of the Act, 21  U.S.C. section 360bbb-3(b)(1), unless the authorization is  terminated or revoked.    Respiratory Syncytial Virus by PCR NEGATIVE NEGATIVE Final    Comment: (NOTE) Fact Sheet for Patients: PinkCheek.be  Fact Sheet for Healthcare Providers: GravelBags.it  This test is not yet approved or cleared by the [place] FDA and  has been authorized for detection and/or diagnosis of SARS-CoV-2 by  FDA under an Emergency Use Authorization (EUA). This EUA will remain  in effect (meaning this test can be used) for the duration of the  COVID-19 declaration under Section 564(b)(1) of the Act, 21 U.S.C.  section 360bbb-3(b)(1), unless the authorization is terminated or  revoked. Performed at [facility], [address], [place], [place] [address]   Culture, blood (routine x 2)     Status: None (Preliminary result)   Collection Time: 01/23/20  2:54 AM   Specimen: BLOOD  Result Value Ref Range Status   Specimen Description BLOOD RIGHT ANTECUBITAL  Final   Special Requests   Final    BOTTLES DRAWN AEROBIC AND ANAEROBIC Blood Culture adequate volume   Culture   Final    NO GROWTH 1 DAY Performed  at [facility], [address] [place], [place] [address]    Report Status PENDING  Incomplete  Culture, blood (routine x 2)     Status: None (Preliminary result)   Collection Time: 01/23/20  3:04 AM   Specimen: BLOOD RIGHT HAND  Result Value Ref Range Status   Specimen Description BLOOD RIGHT HAND  Final   Special Requests   Final    BOTTLES DRAWN AEROBIC AND ANAEROBIC Blood Culture adequate volume   Culture   Final    NO GROWTH 1 DAY Performed at [facility], [address], [place], [place] [address]    Report Status PENDING  Incomplete  MRSA PCR Screening     Status: None   Collection Time: 01/23/20 10:20 AM   Specimen: Nasal Mucosa; Nasopharyngeal  Result Value Ref Range Status   MRSA by PCR NEGATIVE NEGATIVE Final    Comment:        The GeneXpert MRSA Assay (FDA approved for NASAL specimens only), is one component of a comprehensive MRSA colonization surveillance program. It is not intended to diagnose MRSA infection nor to guide or monitor treatment for MRSA infections. Performed at [facility], [address] [place], [place] [address]      Time coordinating discharge: 35 minutes  SIGNED:   Rodena Goldmann, DO [facility] 01/25/2020, 9:21 AM  If 7PM-7AM, please contact night-coverage [email or web address]

## 2020-01-25 NOTE — Progress Notes (Signed)
Nsg Discharge Note  Admit Date:  01/23/2020 Discharge date: 01/25/2020   Veronica Horton to be D/C'd Home per MD order.  AVS completed.  Patient able to verbalize understanding.  Discharge Medication: Allergies as of 01/25/2020      Reactions   Codeine Hives   Penicillins Hives   Did it involve swelling of the face/tongue/throat, SOB, or low BP? No Did it involve sudden or severe rash/hives, skin peeling, or any reaction on the inside of your mouth or nose? Yes Did you need to seek medical attention at a hospital or doctor's office? Unknown When did it last happen?Over 10 years If all above answers are "NO", may proceed with cephalosporin use.      Medication List    STOP taking these medications   guaiFENesin 600 MG 12 hr tablet Commonly known as: MUCINEX     TAKE these medications   albuterol 108 (90 Base) MCG/ACT inhaler Commonly known as: VENTOLIN HFA Inhale 1-2 puffs into the lungs every 6 (six) hours as needed for wheezing or shortness of breath.   albuterol (2.5 MG/3ML) 0.083% nebulizer solution Commonly known as: PROVENTIL Take 2.5 mg by nebulization every 6 (six) hours as needed for wheezing or shortness of breath.   azithromycin 250 MG tablet Commonly known as: Zithromax Z-Pak Take 2 tablets (500 mg) on  Day 1,  followed by 1 tablet (250 mg) once daily on Days 2 through 5.   budesonide-formoterol 160-4.5 MCG/ACT inhaler Commonly known as: Symbicort Inhale 2 puffs into the lungs 2 (two) times daily.   cyclobenzaprine 10 MG tablet Commonly known as: FLEXERIL Take 10 mg by mouth daily as needed for muscle spasms.   dextromethorphan-guaiFENesin 30-600 MG 12hr tablet Commonly known as: MUCINEX DM Take 1 tablet by mouth 2 (two) times daily for 10 days.   feeding supplement (ENSURE ENLIVE) Liqd Take 237 mLs by mouth 2 (two) times daily between meals.   folic acid 1 MG tablet Commonly known as: FOLVITE Take 1 tablet (1 mg total) by mouth daily.    furosemide 40 MG tablet Commonly known as: Lasix Take 1 tablet (40 mg total) by mouth daily.   gabapentin 300 MG capsule Commonly known as: NEURONTIN Take 600 mg by mouth at bedtime.   lisinopril 20 MG tablet Commonly known as: ZESTRIL Take 1 tablet (20 mg total) by mouth daily.   nicotine 7 mg/24hr patch Commonly known as: NICODERM CQ - dosed in mg/24 hr Place 1 patch (7 mg total) onto the skin daily. Start taking on: January 26, 2020   predniSONE 20 MG tablet Commonly known as: Deltasone Take 2 tablets (40 mg total) by mouth daily for 5 days. What changed:   how much to take  how to take this  when to take this  additional instructions   saccharomyces boulardii 250 MG capsule Commonly known as: FLORASTOR Take 1 capsule (250 mg total) by mouth 2 (two) times daily.   vitamin B-12 500 MCG tablet Commonly known as: CYANOCOBALAMIN Take 1 tablet (500 mcg total) by mouth daily.       Discharge Assessment: Vitals:   01/25/20 0734 01/25/20 0744  BP: (!) 103/58   Pulse:    Resp:    Temp:    SpO2:  92%   Skin clean, dry and intact without evidence of skin break down, no evidence of skin tears noted. IV catheter discontinued intact. Site without signs and symptoms of complications - no redness or edema noted at insertion site, patient  denies c/o pain - only slight tenderness at site.  Dressing with slight pressure applied.  D/c Instructions-Education: Discharge instructions given to patient with verbalized understanding. D/c education completed with patient including follow up instructions, medication list, d/c activities limitations if indicated, with other d/c instructions as indicated by MD - patient able to verbalize understanding, all questions fully answered. Patient instructed to return to ED, call 911, or call MD for any changes in condition.  Patient escorted via [place], and D/C home via private auto.  Arlenne Kimbley Loletha Grayer, RN 01/25/2020 10:16 AM

## 2020-01-28 LAB — CULTURE, BLOOD (ROUTINE X 2)
Culture: NO GROWTH
Culture: NO GROWTH
Special Requests: ADEQUATE
Special Requests: ADEQUATE

## 2020-03-10 ENCOUNTER — Other Ambulatory Visit: Payer: Self-pay

## 2020-03-10 ENCOUNTER — Encounter (HOSPITAL_COMMUNITY): Payer: Self-pay | Admitting: Emergency Medicine

## 2020-03-10 ENCOUNTER — Emergency Department (HOSPITAL_COMMUNITY): Payer: Medicaid Other

## 2020-03-10 ENCOUNTER — Inpatient Hospital Stay (HOSPITAL_COMMUNITY)
Admission: EM | Admit: 2020-03-10 | Discharge: 2020-04-16 | DRG: 004 | Disposition: A | Discharge status: Home / Self Care | Payer: Medicaid Other | Attending: Internal Medicine | Admitting: Internal Medicine

## 2020-03-10 DIAGNOSIS — J9621 Acute and chronic respiratory failure with hypoxia: Secondary | ICD-10-CM | POA: Diagnosis present

## 2020-03-10 DIAGNOSIS — K219 Gastro-esophageal reflux disease without esophagitis: Secondary | ICD-10-CM | POA: Diagnosis present

## 2020-03-10 DIAGNOSIS — J690 Pneumonitis due to inhalation of food and vomit: Secondary | ICD-10-CM | POA: Diagnosis present

## 2020-03-10 DIAGNOSIS — J440 Chronic obstructive pulmonary disease with acute lower respiratory infection: Secondary | ICD-10-CM | POA: Diagnosis present

## 2020-03-10 DIAGNOSIS — Z880 Allergy status to penicillin: Secondary | ICD-10-CM | POA: Diagnosis not present

## 2020-03-10 DIAGNOSIS — L03115 Cellulitis of right lower limb: Secondary | ICD-10-CM

## 2020-03-10 DIAGNOSIS — F17210 Nicotine dependence, cigarettes, uncomplicated: Secondary | ICD-10-CM | POA: Diagnosis present

## 2020-03-10 DIAGNOSIS — J9601 Acute respiratory failure with hypoxia: Secondary | ICD-10-CM

## 2020-03-10 DIAGNOSIS — J156 Pneumonia due to other aerobic Gram-negative bacteria: Secondary | ICD-10-CM | POA: Diagnosis present

## 2020-03-10 DIAGNOSIS — R9389 Abnormal findings on diagnostic imaging of other specified body structures: Secondary | ICD-10-CM

## 2020-03-10 DIAGNOSIS — Z20822 Contact with and (suspected) exposure to covid-19: Secondary | ICD-10-CM | POA: Diagnosis present

## 2020-03-10 DIAGNOSIS — J387 Other diseases of larynx: Secondary | ICD-10-CM | POA: Diagnosis present

## 2020-03-10 DIAGNOSIS — J441 Chronic obstructive pulmonary disease with (acute) exacerbation: Principal | ICD-10-CM | POA: Diagnosis present

## 2020-03-10 DIAGNOSIS — E669 Obesity, unspecified: Secondary | ICD-10-CM | POA: Diagnosis present

## 2020-03-10 DIAGNOSIS — Z9981 Dependence on supplemental oxygen: Secondary | ICD-10-CM | POA: Diagnosis not present

## 2020-03-10 DIAGNOSIS — Z6841 Body Mass Index (BMI) 40.0 and over, adult: Secondary | ICD-10-CM

## 2020-03-10 DIAGNOSIS — C321 Malignant neoplasm of supraglottis: Secondary | ICD-10-CM | POA: Diagnosis present

## 2020-03-10 DIAGNOSIS — J9602 Acute respiratory failure with hypercapnia: Secondary | ICD-10-CM | POA: Diagnosis present

## 2020-03-10 DIAGNOSIS — N179 Acute kidney failure, unspecified: Secondary | ICD-10-CM | POA: Diagnosis not present

## 2020-03-10 DIAGNOSIS — R0902 Hypoxemia: Secondary | ICD-10-CM

## 2020-03-10 DIAGNOSIS — Z515 Encounter for palliative care: Secondary | ICD-10-CM | POA: Diagnosis not present

## 2020-03-10 DIAGNOSIS — I82611 Acute embolism and thrombosis of superficial veins of right upper extremity: Secondary | ICD-10-CM | POA: Diagnosis not present

## 2020-03-10 DIAGNOSIS — Z9049 Acquired absence of other specified parts of digestive tract: Secondary | ICD-10-CM | POA: Diagnosis not present

## 2020-03-10 DIAGNOSIS — R1011 Right upper quadrant pain: Secondary | ICD-10-CM

## 2020-03-10 DIAGNOSIS — Z23 Encounter for immunization: Secondary | ICD-10-CM

## 2020-03-10 DIAGNOSIS — Z79899 Other long term (current) drug therapy: Secondary | ICD-10-CM | POA: Diagnosis not present

## 2020-03-10 DIAGNOSIS — Z885 Allergy status to narcotic agent status: Secondary | ICD-10-CM

## 2020-03-10 DIAGNOSIS — G9341 Metabolic encephalopathy: Secondary | ICD-10-CM | POA: Diagnosis present

## 2020-03-10 DIAGNOSIS — D128 Benign neoplasm of rectum: Secondary | ICD-10-CM | POA: Diagnosis present

## 2020-03-10 DIAGNOSIS — E872 Acidosis: Secondary | ICD-10-CM | POA: Diagnosis present

## 2020-03-10 DIAGNOSIS — R0602 Shortness of breath: Secondary | ICD-10-CM

## 2020-03-10 DIAGNOSIS — Z930 Tracheostomy status: Secondary | ICD-10-CM

## 2020-03-10 DIAGNOSIS — J9600 Acute respiratory failure, unspecified whether with hypoxia or hypercapnia: Secondary | ICD-10-CM

## 2020-03-10 DIAGNOSIS — D696 Thrombocytopenia, unspecified: Secondary | ICD-10-CM | POA: Diagnosis present

## 2020-03-10 DIAGNOSIS — Z7951 Long term (current) use of inhaled steroids: Secondary | ICD-10-CM

## 2020-03-10 DIAGNOSIS — E162 Hypoglycemia, unspecified: Secondary | ICD-10-CM | POA: Diagnosis not present

## 2020-03-10 DIAGNOSIS — Z8249 Family history of ischemic heart disease and other diseases of the circulatory system: Secondary | ICD-10-CM

## 2020-03-10 DIAGNOSIS — J90 Pleural effusion, not elsewhere classified: Secondary | ICD-10-CM | POA: Diagnosis not present

## 2020-03-10 DIAGNOSIS — R4182 Altered mental status, unspecified: Secondary | ICD-10-CM

## 2020-03-10 DIAGNOSIS — J9622 Acute and chronic respiratory failure with hypercapnia: Secondary | ICD-10-CM | POA: Diagnosis present

## 2020-03-10 DIAGNOSIS — J9620 Acute and chronic respiratory failure, unspecified whether with hypoxia or hypercapnia: Secondary | ICD-10-CM

## 2020-03-10 DIAGNOSIS — C329 Malignant neoplasm of larynx, unspecified: Secondary | ICD-10-CM

## 2020-03-10 DIAGNOSIS — I10 Essential (primary) hypertension: Secondary | ICD-10-CM | POA: Diagnosis present

## 2020-03-10 DIAGNOSIS — C760 Malignant neoplasm of head, face and neck: Secondary | ICD-10-CM

## 2020-03-10 DIAGNOSIS — K626 Ulcer of anus and rectum: Secondary | ICD-10-CM | POA: Diagnosis present

## 2020-03-10 DIAGNOSIS — K644 Residual hemorrhoidal skin tags: Secondary | ICD-10-CM | POA: Diagnosis present

## 2020-03-10 DIAGNOSIS — J9811 Atelectasis: Secondary | ICD-10-CM | POA: Diagnosis not present

## 2020-03-10 DIAGNOSIS — K640 First degree hemorrhoids: Secondary | ICD-10-CM | POA: Diagnosis present

## 2020-03-10 DIAGNOSIS — R9431 Abnormal electrocardiogram [ECG] [EKG]: Secondary | ICD-10-CM | POA: Diagnosis present

## 2020-03-10 DIAGNOSIS — K5900 Constipation, unspecified: Secondary | ICD-10-CM | POA: Diagnosis not present

## 2020-03-10 DIAGNOSIS — D751 Secondary polycythemia: Secondary | ICD-10-CM | POA: Diagnosis present

## 2020-03-10 DIAGNOSIS — R1313 Dysphagia, pharyngeal phase: Secondary | ICD-10-CM | POA: Diagnosis present

## 2020-03-10 DIAGNOSIS — J189 Pneumonia, unspecified organism: Secondary | ICD-10-CM

## 2020-03-10 DIAGNOSIS — E66813 Obesity, class 3: Secondary | ICD-10-CM | POA: Diagnosis present

## 2020-03-10 DIAGNOSIS — R935 Abnormal findings on diagnostic imaging of other abdominal regions, including retroperitoneum: Secondary | ICD-10-CM | POA: Clinically undetermined

## 2020-03-10 DIAGNOSIS — I82409 Acute embolism and thrombosis of unspecified deep veins of unspecified lower extremity: Secondary | ICD-10-CM

## 2020-03-10 DIAGNOSIS — I878 Other specified disorders of veins: Secondary | ICD-10-CM | POA: Diagnosis present

## 2020-03-10 DIAGNOSIS — R1310 Dysphagia, unspecified: Secondary | ICD-10-CM

## 2020-03-10 DIAGNOSIS — R109 Unspecified abdominal pain: Secondary | ICD-10-CM

## 2020-03-10 LAB — COMPREHENSIVE METABOLIC PANEL
ALT: 12 U/L (ref 0–44)
AST: 14 U/L — ABNORMAL LOW (ref 15–41)
Albumin: 3.6 g/dL (ref 3.5–5.0)
Alkaline Phosphatase: 79 U/L (ref 38–126)
Anion gap: 7 (ref 5–15)
BUN: 16 mg/dL (ref 6–20)
CO2: 34 mmol/L — ABNORMAL HIGH (ref 22–32)
Calcium: 8.6 mg/dL — ABNORMAL LOW (ref 8.9–10.3)
Chloride: 96 mmol/L — ABNORMAL LOW (ref 98–111)
Creatinine, Ser: 1.01 mg/dL — ABNORMAL HIGH (ref 0.44–1.00)
GFR, Estimated: >60 mL/min (ref >60)
Glucose, Bld: 104 mg/dL — ABNORMAL HIGH (ref 70–99)
Potassium: 4.6 mmol/L (ref 3.5–5.1)
Sodium: 137 mmol/L (ref 135–145)
Total Bilirubin: 0.8 mg/dL (ref 0.3–1.2)
Total Protein: 7.3 g/dL (ref 6.5–8.1)

## 2020-03-10 LAB — BLOOD GAS, ARTERIAL
Acid-Base Excess: 9.2 mmol/L — ABNORMAL HIGH (ref 0.0–2.0)
Acid-Base Excess: 8.7 mmol/L — ABNORMAL HIGH (ref 0.0–2.0)
Bicarbonate: 29 mmol/L — ABNORMAL HIGH (ref 20.0–28.0)
Bicarbonate: 28.7 mmol/L — ABNORMAL HIGH (ref 20.0–28.0)
FIO2: 36
FIO2: 40
O2 Saturation: 95.4 %
O2 Saturation: 93.9 %
Patient temperature: 36.8
Patient temperature: 36.9
pCO2 arterial: 91.7 mmHg (ref 32.0–48.0)
pCO2 arterial: 84.7 mmHg (ref 32.0–48.0)
pH, Arterial: 7.226 — ABNORMAL LOW (ref 7.350–7.450)
pH, Arterial: 7.249 — ABNORMAL LOW (ref 7.350–7.450)
pO2, Arterial: 82 mmHg — ABNORMAL LOW (ref 83.0–108.0)
pO2, Arterial: 73.4 mmHg — ABNORMAL LOW (ref 83.0–108.0)

## 2020-03-10 LAB — TROPONIN I (HIGH SENSITIVITY)
Troponin I (High Sensitivity): 9 ng/L
Troponin I (High Sensitivity): 8 ng/L

## 2020-03-10 LAB — CBC WITH DIFFERENTIAL/PLATELET
Abs Immature Granulocytes: 0.04 10*3/uL (ref 0.00–0.07)
Basophils Absolute: 0 10*3/uL (ref 0.0–0.1)
Basophils Relative: 0 %
Eosinophils Absolute: 0.2 10*3/uL (ref 0.0–0.5)
Eosinophils Relative: 2 %
HCT: 47.2 % — ABNORMAL HIGH (ref 36.0–46.0)
Hemoglobin: 14.5 g/dL (ref 12.0–15.0)
Immature Granulocytes: 0 %
Lymphocytes Relative: 10 %
Lymphs Abs: 1 10*3/uL (ref 0.7–4.0)
MCH: 31.3 pg (ref 26.0–34.0)
MCHC: 30.7 g/dL (ref 30.0–36.0)
MCV: 101.9 fL — ABNORMAL HIGH (ref 80.0–100.0)
Monocytes Absolute: 0.7 10*3/uL (ref 0.1–1.0)
Monocytes Relative: 7 %
Neutro Abs: 8.1 10*3/uL — ABNORMAL HIGH (ref 1.7–7.7)
Neutrophils Relative %: 81 %
Platelets: 220 10*3/uL (ref 150–400)
RBC: 4.63 MIL/uL (ref 3.87–5.11)
RDW: 14.7 % (ref 11.5–15.5)
WBC: 10.1 10*3/uL (ref 4.0–10.5)
nRBC: 0 % (ref 0.0–0.2)

## 2020-03-10 LAB — LACTIC ACID, PLASMA
Lactic Acid, Venous: 0.6 mmol/L (ref 0.5–1.9)
Lactic Acid, Venous: 0.6 mmol/L (ref 0.5–1.9)

## 2020-03-10 LAB — MRSA PCR SCREENING: MRSA by PCR: NEGATIVE

## 2020-03-10 LAB — RESPIRATORY PANEL BY RT PCR (FLU A&B, COVID)
Influenza A by PCR: NEGATIVE
Influenza B by PCR: NEGATIVE
SARS Coronavirus 2 by RT PCR: NEGATIVE

## 2020-03-10 LAB — BRAIN NATRIURETIC PEPTIDE: B Natriuretic Peptide: 183 pg/mL — ABNORMAL HIGH (ref 0.0–100.0)

## 2020-03-10 LAB — MAGNESIUM: Magnesium: 2.3 mg/dL (ref 1.7–2.4)

## 2020-03-10 MED ORDER — IPRATROPIUM-ALBUTEROL 0.5-2.5 (3) MG/3ML IN SOLN: 3.0000 mL | Freq: Once | RESPIRATORY_TRACT | Status: DC

## 2020-03-10 MED ORDER — ENOXAPARIN SODIUM 40 MG/0.4ML ~~LOC~~ SOLN: 40.0000 mg | SUBCUTANEOUS | Status: DC

## 2020-03-10 MED ORDER — ENOXAPARIN SODIUM 60 MG/0.6ML ~~LOC~~ SOLN
55.0000 mg | SUBCUTANEOUS | Status: AC
  Administered 2020-03-10 – 2020-03-20 (×10): 55 mg via SUBCUTANEOUS
  Filled 2020-03-10: qty 0.55
  Filled 2020-03-10: qty 0.6
  Filled 2020-03-10: qty 0.55
  Filled 2020-03-10 (×2): qty 0.6
  Filled 2020-03-10 (×7): qty 0.55

## 2020-03-10 MED ORDER — MAGNESIUM SULFATE 2 GM/50ML IV SOLN
2.0000 g | Freq: Once | INTRAVENOUS | Status: AC
  Administered 2020-03-10: 2 g via INTRAVENOUS
  Filled 2020-03-10: qty 50

## 2020-03-10 MED ORDER — ALBUTEROL (5 MG/ML) CONTINUOUS INHALATION SOLN
7.5000 mg/h | INHALATION_SOLUTION | Freq: Once | RESPIRATORY_TRACT | Status: AC
  Administered 2020-03-10: 7.5 mg/h via RESPIRATORY_TRACT
  Filled 2020-03-10: qty 20

## 2020-03-10 MED ORDER — MOMETASONE FURO-FORMOTEROL FUM 200-5 MCG/ACT IN AERO: 2.0000 | INHALATION_SPRAY | Freq: Two times a day (BID) | RESPIRATORY_TRACT | Status: DC

## 2020-03-10 MED ORDER — CHLORHEXIDINE GLUCONATE CLOTH 2 % EX PADS
6.0000 | MEDICATED_PAD | Freq: Every day | CUTANEOUS | Status: DC
  Administered 2020-03-10 – 2020-04-04 (×22): 6 via TOPICAL

## 2020-03-10 MED ORDER — SODIUM CHLORIDE 0.9 % IV SOLN: 500.0000 mg | INTRAVENOUS | Status: DC

## 2020-03-10 MED ORDER — FLUTICASONE FUROATE-VILANTEROL 200-25 MCG/INH IN AEPB
1.0000 | INHALATION_SPRAY | Freq: Every day | RESPIRATORY_TRACT | Status: DC
  Administered 2020-03-11 – 2020-03-13 (×3): 1 via RESPIRATORY_TRACT
  Filled 2020-03-10 (×2): qty 28

## 2020-03-10 MED ORDER — FUROSEMIDE 10 MG/ML IJ SOLN
40.0000 mg | Freq: Two times a day (BID) | INTRAMUSCULAR | Status: DC
  Administered 2020-03-10 – 2020-03-11 (×2): 40 mg via INTRAVENOUS
  Filled 2020-03-10 (×2): qty 4

## 2020-03-10 MED ORDER — AEROCHAMBER Z-STAT PLUS/MEDIUM MISC
Status: AC
  Filled 2020-03-10: qty 1

## 2020-03-10 MED ORDER — CLINDAMYCIN PHOSPHATE 600 MG/50ML IV SOLN: 600.0000 mg | Freq: Once | INTRAVENOUS | Status: DC

## 2020-03-10 MED ORDER — IPRATROPIUM-ALBUTEROL 20-100 MCG/ACT IN AERS: 1.0000 | INHALATION_SPRAY | Freq: Once | RESPIRATORY_TRACT | Status: AC

## 2020-03-10 MED ORDER — PANTOPRAZOLE SODIUM 40 MG IV SOLR
40.0000 mg | INTRAVENOUS | Status: DC
  Administered 2020-03-10 – 2020-03-23 (×13): 40 mg via INTRAVENOUS
  Filled 2020-03-10 (×13): qty 40

## 2020-03-10 MED ORDER — IPRATROPIUM-ALBUTEROL 20-100 MCG/ACT IN AERS
INHALATION_SPRAY | RESPIRATORY_TRACT | Status: AC
  Filled 2020-03-10: qty 4

## 2020-03-10 MED ORDER — METHYLPREDNISOLONE SODIUM SUCC 125 MG IJ SOLR
125.0000 mg | Freq: Once | INTRAMUSCULAR | Status: AC
  Administered 2020-03-10: 125 mg via INTRAVENOUS
  Filled 2020-03-10: qty 2

## 2020-03-10 MED ORDER — ALBUTEROL SULFATE (2.5 MG/3ML) 0.083% IN NEBU
2.5000 mg | INHALATION_SOLUTION | Freq: Four times a day (QID) | RESPIRATORY_TRACT | Status: DC
  Administered 2020-03-11 (×3): 2.5 mg via RESPIRATORY_TRACT
  Filled 2020-03-10 (×3): qty 3

## 2020-03-10 MED ORDER — SODIUM CHLORIDE 0.9 % IV SOLN
100.0000 mg | Freq: Two times a day (BID) | INTRAVENOUS | Status: DC
  Administered 2020-03-10 – 2020-03-14 (×7): 100 mg via INTRAVENOUS
  Filled 2020-03-10 (×13): qty 100

## 2020-03-10 MED ORDER — METHYLPREDNISOLONE SODIUM SUCC 40 MG IJ SOLR
40.0000 mg | Freq: Four times a day (QID) | INTRAMUSCULAR | Status: DC
  Administered 2020-03-10 – 2020-03-15 (×19): 40 mg via INTRAVENOUS
  Filled 2020-03-10 (×19): qty 1

## 2020-03-10 MED ORDER — FUROSEMIDE 10 MG/ML IJ SOLN
80.0000 mg | Freq: Once | INTRAMUSCULAR | Status: AC
  Administered 2020-03-10: 80 mg via INTRAVENOUS
  Filled 2020-03-10: qty 8

## 2020-03-10 NOTE — ED Provider Notes (Addendum)
[facility] EMERGENCY DEPARTMENT Provider Note   CSN: [id number] Arrival date & time: 03/10/20  1150     History SOB   Veronica Horton is a 59 y.o. female with past medical history significant for COPD, hypertension, hiatal hernia, chronic respiratory failure, obesity, lower extremity edema on chronic Lasix who presents for evaluation of shortness of breath.  Patient states progressively worsening dyspnea over the last 4 days.  She is not vaccinated against Covid.  Has been taking her medications without relief.  Has been using her home albuterol without relief.  Is on chronic 5 L of oxygen via nasal cannula.  She is an active tobacco user.   Equal edema to BLE however some redness and warmth between ankle and mid shaft shin on Right. Overlying warmth.  Recent admission 01/23/20 for CAP and COPD exacerbation.   Level 5 caveat- SOB   HPI     Past Medical History:  Diagnosis Date  . Bronchitis   . Class 3 obesity 01/23/2020  . COPD (chronic obstructive pulmonary disease) ([place])   . Degenerative disc disease, lumbar   . Hiatal hernia   . Hypertension     Patient Active Problem List   Diagnosis Date Noted  . Acute respiratory failure with hypercapnia ([place]) 03/10/2020  . Acute exacerbation of chronic obstructive pulmonary disease (COPD) ([place]) 01/24/2020  . Acute respiratory failure with hypoxia and hypercapnia ([place]) 01/23/2020  . Class 3 obesity 01/23/2020  . Tobacco abuse 01/23/2020  . Pneumonia   . Aspiration pneumonitis ([place]) 01/17/2019  . Incisional hernia, without obstruction or gangrene   . COPD with acute exacerbation ([place]) 01/14/2019  . Small bowel obstruction ([place]) 01/13/2019  . COPD (chronic obstructive pulmonary disease) ([place])   . Hypertension   . Acute respiratory failure with hypoxia ([place])   . Polycythemia     Past Surgical History:  Procedure Laterality Date  . CHOLECYSTECTOMY    . degenerative bone disease    . INCISIONAL HERNIA REPAIR N/A 01/15/2019    Procedure: [name] HERNIORRHAPHY WITH MESH;  Surgeon: [name], MD;  Location: [facility];  Service: General;  Laterality: N/A;  . OMENTECTOMY N/A 01/15/2019   Procedure: OMENTECTOMY;  Surgeon: [name], MD;  Location: [facility];  Service: General;  Laterality: N/A;     OB History   No obstetric history on file.     Family History  Problem Relation Age of Onset  . Hypertension Mother   . Sudden Cardiac Death Neg Hx     Social History   Tobacco Use  . Smoking status: Current Every Day Smoker    Packs/day: 1.00  . Smokeless tobacco: Never Used  Substance Use Topics  . Alcohol use: No  . Drug use: No    Home Medications Prior to Admission medications   Medication Sig Start Date End Date Taking? Authorizing Provider  albuterol (PROVENTIL) (2.5 MG/3ML) 0.083% nebulizer solution Take 2.5 mg by nebulization every 6 (six) hours as needed for wheezing or shortness of breath.    [provider]  albuterol (VENTOLIN HFA) 108 (90 Base) MCG/ACT inhaler Inhale 1-2 puffs into the lungs every 6 (six) hours as needed for wheezing or shortness of breath.    [provider]  budesonide-formoterol (SYMBICORT) 160-4.5 MCG/ACT inhaler Inhale 2 puffs into the lungs 2 (two) times daily. 01/30/19   [name], MD  cyclobenzaprine (FLEXERIL) 10 MG tablet Take 10 mg by mouth daily as needed for muscle spasms.    [provider]  feeding  supplement, ENSURE ENLIVE, (ENSURE ENLIVE) LIQD Take 237 mLs by mouth 2 (two) times daily between meals. 01/25/20   Manuella Ghazi, Pratik D, DO  folic acid (FOLVITE) 1 MG tablet Take 1 tablet (1 mg total) by mouth daily. 01/31/19   [name], MD  furosemide (LASIX) 40 MG tablet Take 1 tablet (40 mg total) by mouth daily. 01/30/19 01/23/20  [name], MD  gabapentin (NEURONTIN) 300 MG capsule Take 600 mg by mouth at bedtime.  12/30/18   [provider]  lisinopril (ZESTRIL) 20 MG tablet Take 1 tablet (20 mg total) by mouth daily.  01/31/19   [name], MD  nicotine (NICODERM CQ - DOSED IN MG/24 HR) 7 mg/24hr patch Place 1 patch (7 mg total) onto the skin daily. 01/26/20   Manuella Ghazi, Pratik D, DO  saccharomyces boulardii (FLORASTOR) 250 MG capsule Take 1 capsule (250 mg total) by mouth 2 (two) times daily. Patient not taking: Reported on 01/23/2020 01/30/19   [name], MD  vitamin B-12 (CYANOCOBALAMIN) 500 MCG tablet Take 1 tablet (500 mcg total) by mouth daily. Patient not taking: Reported on 01/23/2020 01/31/19   [name], MD    Allergies    Codeine and Penicillins  Review of Systems   Review of Systems  Constitutional: Negative.   HENT: Negative.   Respiratory: Positive for cough and shortness of breath. Negative for apnea, choking, chest tightness, wheezing and stridor.   Cardiovascular: Positive for leg swelling.  Gastrointestinal: Negative.   Genitourinary: Negative.   Musculoskeletal: Negative.   Skin: Positive for rash.  Neurological: Negative.   All other systems reviewed and are negative.   Physical Exam Updated Vital Signs BP (!) 104/44   Pulse 70   Temp 98.4 F (36.9 C) (Oral)   Resp 15   Ht 5\' 5"  (1.651 m)   Wt 111.6 kg   SpO2 98%   BMI 40.94 kg/m   Physical Exam Vitals and nursing note reviewed.  Constitutional:      General: She is not in acute distress.    Appearance: She is obese.     Comments: Sleepy  HENT:     Head: Normocephalic and atraumatic.     Mouth/Throat:     Pharynx: Oropharynx is clear.  Eyes:     Pupils: Pupils are equal, round, and reactive to light.  Cardiovascular:     Rate and Rhythm: Normal rate.     Pulses: Normal pulses.     Heart sounds: Normal heart sounds.  Pulmonary:     Effort: No respiratory distress.     Breath sounds: Decreased breath sounds, wheezing and rhonchi present.     Comments: Mild expiratory wheeze, decreased breath sound throughout. Mild course rhonchi on BL bases. Speaks in short 1-2 word sentences Chest:     Chest wall: No  mass, deformity, tenderness, crepitus or edema.  Abdominal:     General: Bowel sounds are normal. There is no distension.     Palpations: Abdomen is soft. There is no mass.     Tenderness: There is no abdominal tenderness. There is no guarding or rebound.  Musculoskeletal:        General: Normal range of motion.     Cervical back: Normal range of motion.     Right lower leg: Tenderness present. Edema present.     Left lower leg: No tenderness. Edema present.     Comments: No bony tenderness.  Compartments soft.  2+ any edema to mid shins bilaterally.  Bevelyn Buckles' sign negative.  Skin:    General: Skin is warm and dry.     Capillary Refill: Capillary refill takes less than 2 seconds.     Comments: Erythema and warmth to right lower extremity from ankle to mid shin.  No fluctuance or induration.  Warm circumferential.  Does not extend into calves.  Chronic venous stasis skin changes to bilateral lower extremities.  Neurological:     Mental Status: She is oriented to person, place, and time.     Comments: Oriented x 3 however sleepy Moves all 4 extremities equally Intact sensation 5/5 strength bilaterally     ED Results / Procedures / Treatments   Labs (all labs ordered are listed, but only abnormal results are displayed) Labs Reviewed  COMPREHENSIVE METABOLIC PANEL - Abnormal; Notable for the following components:      Result Value   Chloride 96 (*)    CO2 34 (*)    Glucose, Bld 104 (*)    Creatinine, Ser 1.01 (*)    Calcium 8.6 (*)    AST 14 (*)    All other components within normal limits  CBC WITH DIFFERENTIAL/PLATELET - Abnormal; Notable for the following components:   HCT 47.2 (*)    MCV 101.9 (*)    Neutro Abs 8.1 (*)    All other components within normal limits  BLOOD GAS, ARTERIAL - Abnormal; Notable for the following components:   pH, Arterial 7.226 (*)    pCO2 arterial 91.7 (*)    pO2, Arterial 82.0 (*)    Bicarbonate 29.0 (*)    Acid-Base Excess 9.2 (*)    All  other components within normal limits  BRAIN NATRIURETIC PEPTIDE - Abnormal; Notable for the following components:   B Natriuretic Peptide 183.0 (*)    All other components within normal limits  RESPIRATORY PANEL BY RT PCR (FLU A&B, COVID)  LACTIC ACID, PLASMA  LACTIC ACID, PLASMA  MAGNESIUM  BLOOD GAS, ARTERIAL  TROPONIN I (HIGH SENSITIVITY)  TROPONIN I (HIGH SENSITIVITY)    EKG None  Radiology DG Chest Portable 1 View  Result Date: 03/10/2020 CLINICAL DATA:  Shortness of breath and cough EXAM: PORTABLE CHEST 1 VIEW COMPARISON:  January 23, 2020 FINDINGS: There is bibasilar atelectatic change. Elsewhere the interstitium is mildly thickened. No consolidation. Heart size and pulmonary vascularity are normal. No adenopathy. There is degenerative change in each shoulder. IMPRESSION: Bibasilar atelectasis. Interstitial thickening likely represents a degree of underlying chronic bronchitis. No edema or airspace opacity. Heart size within normal limits. Electronically Signed   By: Lowella Grip III M.D.   On: 03/10/2020 12:42    Procedures .Critical Care Performed by: Nettie Elm, PA-C Authorized by: Nettie Elm, PA-C   Critical care provider statement:    Critical care time (minutes):  45   Critical care was necessary to treat or prevent imminent or life-threatening deterioration of the following conditions:  Respiratory failure   Critical care was time spent personally by me on the following activities:  Discussions with consultants, evaluation of patient's response to treatment, examination of patient, ordering and performing treatments and interventions, ordering and review of laboratory studies, ordering and review of radiographic studies, pulse oximetry, re-evaluation of patient's condition, obtaining history from patient or surrogate and review of old charts   (including critical care time)  Medications Ordered in ED Medications  methylPREDNISolone sodium  succinate (SOLU-MEDROL) 40 mg/mL injection 40 mg (has no administration in time range)  pantoprazole (PROTONIX) injection 40 mg (has no administration in time  range)  furosemide (LASIX) injection 40 mg (has no administration in time range)  magnesium sulfate IVPB 2 g 50 mL (0 g Intravenous Stopped 03/10/20 1342)  methylPREDNISolone sodium succinate (SOLU-MEDROL) 125 mg/2 mL injection 125 mg (125 mg Intravenous Given 03/10/20 1232)  furosemide (LASIX) injection 80 mg (80 mg Intravenous Given 03/10/20 1231)  Ipratropium-Albuterol (COMBIVENT) respimat 1 puff ( Inhalation Given 03/10/20 1329)  aerochamber Z-Stat Plus/medium (  Given 03/10/20 1342)  albuterol (PROVENTIL,VENTOLIN) solution continuous neb (7.5 mg/hr Nebulization Given 03/10/20 1429)    ED Course  I have reviewed the triage vital signs and the nursing notes.  Pertinent labs & imaging results that were available during my care of the patient were reviewed by me and considered in my medical decision making (see chart for details).  59 year old presents for evaluation of shortness of breath.  She is afebrile, nonseptic appearing.  Does appear moderately dyspneic on exam.  She is on her home oxygen of 5 L via nasal cannula however has significantly decreased breath sounds bilaterally with some mild expiratory wheeze and some minimal rales bibasilar lungs.  Abdomen soft, nontender.  She is sleepy on exam however neurovascularly intact.  States she did not sleep last night.  Does have history of hypercapnic respiratory failure.  Has been increasing her home Lasix without relief.  Multiple duoneb's at home PTA without relief. Does have 2+ pitting edema to however does have some erythema to her right lower extremity between ankle and midshaft.  There is some overlying warmth.  Her compartments are soft.  Her most likely this is cellulitis versus DVT.  Plan on labs, imaging and reassess.  Labs and imaging personally reviewed and interpreted:  DG  with possible chronic bronchitis, bibasilar atelectasis.  No infiltrates.  Heart within normal limits, no pulmonary edema. CBC without leukocytosis Trop 9 Lactic 0.6 CMP CO2 34, glucose 1 4, creatinine 1.10 ABG with CO2 at 91.7, O2 82.0, Ph 7.226 EKG without ischemia COVID negative  Patient reassessed. Continues to be sleepy. ABG does indicate hypercarbic respiratory failure. Patient placed on BiPAP with continuous neb. Will need admission.  Patient's unilateral leg redness consistent with cellulitis.  I have low suspicion for DVT.  Will treat with antibiotics. Allergy to Penicillins and Cephalosporins. Given patient's somnolent we will give dose IV antibiotics.  Low suspicion for acute bacterial pneumonia, PE, dissection, ACS as cause of patient's respiratory failure.  Patient reassessed. Continues to be sleepy however arousal to voice and pain. On BiPap. Getting continuous neb  CONSULT with Dr. Waldron Labs with [facility] who will evaluate to admit  Patient seen eval by attending, Dr. Vanita Panda who agrees above treatment, plan and disposition.    MDM Rules/Calculators/A&P                         KENYETTA WIMBISH was evaluated in Emergency Department on 03/10/2020 for the symptoms described in the history of present illness. She was evaluated in the context of the global COVID-19 pandemic, which necessitated consideration that the patient might be at risk for infection with the SARS-CoV-2 virus that causes COVID-19. Institutional protocols and algorithms that pertain to the evaluation of patients at risk for COVID-19 are in a state of rapid change based on information released by regulatory bodies including the CDC and federal and state organizations. These policies and algorithms were followed during the patient's care in the ED. Final Clinical Impression(s) / ED Diagnoses Final diagnoses:  Acute on chronic respiratory failure with  hypoxia and hypercapnia (HCC)  Cellulitis of right lower extremity   Altered mental status, unspecified altered mental status type    Rx / DC Orders ED Discharge Orders    None       Kathrina Crosley A, PA-C 03/10/20 1621    Jenesys Casseus A, PA-C 03/10/20 1622    Carmin Muskrat, MD 03/12/20 [id number]

## 2020-03-10 NOTE — ED Triage Notes (Addendum)
Pt c/o of sob and cough that worsening last night. 85% on room air

## 2020-03-10 NOTE — H&P (Addendum)
TRH H&P   Patient Demographics:    Veronica Horton, is a 59 y.o. female  MRN: [id number]   DOB - [birth year]  Admit Date - 03/10/2020  Outpatient Primary MD for the patient is [facility]  Referring MD/NP/PA: PA Britni  Patient coming from: Home  Chief Complaint  Patient presents with  . Shortness of Breath      HPI:    Veronica Horton  is a 59 y.o. female, with medical history significant ofclass III obesity, hiatal hernia, hypertension, bronchitis, COPD, chronic respiratory failure on home oxygen at 5 LPM, active smoker of 1-1/2 packs of cigarettes per day , patient presents to ED secondary to shortness of breath and altered mental status, patient with receptive worsening dyspnea over the last 4 days, she is not vaccinated against Covid, she is altered at the time of my examination, history was obtained from boyfriend at bedside, and ED staff, patient still smoking, she still using 5 L of oxygen, he is with increased work of breathing, significantly dyspneic, upon presentation to ED she is altered not provide any complaints at this point. - in ED chest x-ray was significant for no opacity or volume overload, her ABG was significant for pH of 7.2, PCO2 of 92 for which she is started on BiPAP, upon within normal range, creatinine at baseline at 1, globin is stable at 14.5 (she is with known history of polycythemia), she was started on IV steroids, and IV Lasix given she has significant lower extremity edema.    Review of systems:    Patient unable to provide any review of system given her encephalopathy and altered mental status.  With Past History of the following :    Past Medical History:  Diagnosis Date  . Bronchitis   . Class 3 obesity 01/23/2020  . COPD (chronic obstructive pulmonary disease) ([place])   . Degenerative disc disease, lumbar   . Hiatal  hernia   . Hypertension       Past Surgical History:  Procedure Laterality Date  . CHOLECYSTECTOMY    . degenerative bone disease    . INCISIONAL HERNIA REPAIR N/A 01/15/2019   Procedure: [name] HERNIORRHAPHY WITH MESH;  Surgeon: [name], MD;  Location: [facility];  Service: General;  Laterality: N/A;  . OMENTECTOMY N/A 01/15/2019   Procedure: OMENTECTOMY;  Surgeon: [name], MD;  Location: [facility];  Service: General;  Laterality: N/A;      Social History:     Social History   Tobacco Use  . Smoking status: Current Every Day Smoker    Packs/day: 1.00  . Smokeless tobacco: Never Used  Substance Use Topics  . Alcohol use: No       Family History :     Family History  Problem Relation Age of Onset  . Hypertension Mother   . Sudden Cardiac Death Neg Hx  Home Medications:   Prior to Admission medications   Medication Sig Start Date End Date Taking? Authorizing Provider  albuterol (PROVENTIL) (2.5 MG/3ML) 0.083% nebulizer solution Take 2.5 mg by nebulization every 6 (six) hours as needed for wheezing or shortness of breath.    [provider]  albuterol (VENTOLIN HFA) 108 (90 Base) MCG/ACT inhaler Inhale 1-2 puffs into the lungs every 6 (six) hours as needed for wheezing or shortness of breath.    [provider]  budesonide-formoterol (SYMBICORT) 160-4.5 MCG/ACT inhaler Inhale 2 puffs into the lungs 2 (two) times daily. 01/30/19   [name], MD  cyclobenzaprine (FLEXERIL) 10 MG tablet Take 10 mg by mouth daily as needed for muscle spasms.    [provider]  feeding supplement, ENSURE ENLIVE, (ENSURE ENLIVE) LIQD Take 237 mLs by mouth 2 (two) times daily between meals. 01/25/20   Manuella Ghazi, Pratik D, DO  folic acid (FOLVITE) 1 MG tablet Take 1 tablet (1 mg total) by mouth daily. 01/31/19   [name], MD  furosemide (LASIX) 40 MG tablet Take 1 tablet (40 mg total) by mouth daily. 01/30/19 01/23/20  [name], MD  gabapentin  (NEURONTIN) 300 MG capsule Take 600 mg by mouth at bedtime.  12/30/18   [provider]  lisinopril (ZESTRIL) 20 MG tablet Take 1 tablet (20 mg total) by mouth daily. 01/31/19   [name], MD  nicotine (NICODERM CQ - DOSED IN MG/24 HR) 7 mg/24hr patch Place 1 patch (7 mg total) onto the skin daily. 01/26/20   Manuella Ghazi, Pratik D, DO  saccharomyces boulardii (FLORASTOR) 250 MG capsule Take 1 capsule (250 mg total) by mouth 2 (two) times daily. Patient not taking: Reported on 01/23/2020 01/30/19   [name], MD  vitamin B-12 (CYANOCOBALAMIN) 500 MCG tablet Take 1 tablet (500 mcg total) by mouth daily. Patient not taking: Reported on 01/23/2020 01/31/19   [name], MD     Allergies:     Allergies  Allergen Reactions  . Codeine Hives  . Penicillins Hives    Did it involve swelling of the face/tongue/throat, SOB, or low BP? No Did it involve sudden or severe rash/hives, skin peeling, or any reaction on the inside of your mouth or nose? Yes Did you need to seek medical attention at a hospital or doctor's office? Unknown When did it last happen?Over 10 years If all above answers are "NO", may proceed with cephalosporin use.      Physical Exam:   Vitals  Blood pressure (!) 124/53, pulse 62, temperature 98.4 F (36.9 C), temperature source Oral, resp. rate 14, height 5\' 5"  (1.651 m), weight 111.6 kg, SpO2 100 %.   1. General Beese female, lethargic in bed on BiPAP   2.  SHEENT is lethargic, grimaces to loud verbal stimuli and to painful stimuli, otherwise unable to provide any history or answer any questions appropriately or follow any commands..  3.  Appears to be moving all extremities to painful stimuli, otherwise unable to do appropriate neurological exam given her altered mentation.  4. Ears and Eyes appear Normal, Conjunctivae clear, on BiPAP mask  5. Supple Neck, No JVD, No cervical lymphadenopathy appriciated, No Carotid Bruits.  6. Symmetrical Chest wall  movement, Minister air entry bilaterally with scattered wheezing  7. RRR, No Gallops, Rubs or Murmurs, No Parasternal Heave.  8. Positive Bowel Sounds, Abdomen Soft, No tenderness, No organomegaly appriciated,No rebound -guarding or rigidity.  9.  No Cyanosis, Normal Skin Turgor, 2 edema bilaterally, with some mild  erythema in her lower extremities(does not appear to be cellulitis)  10. Good muscle tone,  joints appear normal , no effusions, Normal ROM.  11. No Palpable Lymph Nodes in Neck or Axillae   Data Review:    CBC Recent Labs  Lab 03/10/20 1227  WBC 10.1  HGB 14.5  HCT 47.2*  PLT 220  MCV 101.9*  MCH 31.3  MCHC 30.7  RDW 14.7  LYMPHSABS 1.0  MONOABS 0.7  EOSABS 0.2  BASOSABS 0.0   ------------------------------------------------------------------------------------------------------------------  Chemistries  Recent Labs  Lab 03/10/20 1226  NA 137  K 4.6  CL 96*  CO2 34*  GLUCOSE 104*  BUN 16  CREATININE 1.01*  CALCIUM 8.6*  AST 14*  ALT 12  ALKPHOS 79  BILITOT 0.8   ------------------------------------------------------------------------------------------------------------------ estimated creatinine clearance is 73.7 mL/min (A) (by C-G formula based on SCr of 1.01 mg/dL (H)). ------------------------------------------------------------------------------------------------------------------ No results for input(s): TSH, T4TOTAL, T3FREE, THYROIDAB in the last 72 hours.  Invalid input(s): FREET3  Coagulation profile No results for input(s): INR, PROTIME in the last 168 hours. ------------------------------------------------------------------------------------------------------------------- No results for input(s): DDIMER in the last 72 hours. -------------------------------------------------------------------------------------------------------------------  Cardiac Enzymes No results for input(s): CKMB, TROPONINI, MYOGLOBIN in the last 168  hours.  Invalid input(s): CK ------------------------------------------------------------------------------------------------------------------    Component Value Date/Time   BNP 50.0 01/23/2020 0234     ---------------------------------------------------------------------------------------------------------------  Urinalysis    Component Value Date/Time   COLORURINE YELLOW 01/13/2019 1730   APPEARANCEUR CLOUDY (A) 01/13/2019 1730   LABSPEC 1.017 01/13/2019 1730   PHURINE 8.0 01/13/2019 1730   GLUCOSEU NEGATIVE 01/13/2019 1730   HGBUR NEGATIVE 01/13/2019 1730   BILIRUBINUR NEGATIVE 01/13/2019 1730   KETONESUR NEGATIVE 01/13/2019 1730   PROTEINUR 100 (A) 01/13/2019 1730   UROBILINOGEN 0.2 09/25/2010 0530   NITRITE NEGATIVE 01/13/2019 1730   LEUKOCYTESUR NEGATIVE 01/13/2019 1730    ----------------------------------------------------------------------------------------------------------------   Imaging Results:    DG Chest Portable 1 View  Result Date: 03/10/2020 CLINICAL DATA:  Shortness of breath and cough EXAM: PORTABLE CHEST 1 VIEW COMPARISON:  January 23, 2020 FINDINGS: There is bibasilar atelectatic change. Elsewhere the interstitium is mildly thickened. No consolidation. Heart size and pulmonary vascularity are normal. No adenopathy. There is degenerative change in each shoulder. IMPRESSION: Bibasilar atelectasis. Interstitial thickening likely represents a degree of underlying chronic bronchitis. No edema or airspace opacity. Heart size within normal limits. Electronically Signed   By: Lowella Grip III M.D.   On: 03/10/2020 12:42    My personal review of EKG: Rhythm NSR, Rate  59 /min, QTc 527,    Assessment & Plan:    Active Problems:   Hypertension   Polycythemia   Class 3 obesity   Acute exacerbation of chronic obstructive pulmonary disease (COPD) (HCC)   Acute respiratory failure with hypercapnia (HCC)  Acute respiratory failure with  hypercapnia due  to COPD with acute exacerbation  -With respiratory acidosis, PCO2 of 91, with altered mental status, this is due to CV exacerbation. -Continue with BiPAP, will repeat ABG in 1 hours. -Continue to aim for oxygen saturation 90 to 93%. -She will be started on IV Solu-Medrol 40 mg IV every 6 hours, scheduled Combivent, as needed albuterol, and Symbicort (formulary0. -sHe is with prolonged QTC, will avoid azithromycin or quinolone, will start on doxycycline. -Given her encephalopathy will be kept n.p.o.. -He is high risk for intubation, will admit to ICU. -He is with lower extremity edema, will keep on IV Lasix, will check BNP  Acute metabolic encephalopathy -Due to  CO2 retention.  Hypoxic respiratory failure -He is at 5 L baseline nasal cannula, keep oxygen saturation 90 to 93% due to CO2 retention.  Lisinopril -Blood pressure acceptable, hold lisinopril as unable to take oral currently and she is on IV Lasix  QTC prolongation -Avoid prolonging agent, keep magnesium> 2 and potassium> 4 -Trend telemetry and repeat EKG in a.m.  Class 3 obesity Needs significant lifestyle modifications.  Tobacco abuse -We will need counseling 1 more awake  Lower extremity edema -Does not appear to be cellulitis, it has mildly warm due to significant edema, will obtain 2D echo and continue Lasix, but she will be on doxycycline regardless in the setting of her COPD.   DVT Prophylaxis   Lovenox - SCDs   AM Labs Ordered, also please review Full Orders  Family Communication: Admission, patients condition and plan of care including tests being ordered have been discussed with boyfriend at bedside who indicate understanding and agree with the plan and Code Status.  Code Status Full  Likely DC to  home  Condition GUARDED    Consults called: none    Admission status: inpatient    Time spent in minutes : 60 minutes   Phillips Climes M.D on 03/10/2020 at 3:26 PM   [facility] - Office   [phone]

## 2020-03-11 ENCOUNTER — Inpatient Hospital Stay (HOSPITAL_COMMUNITY): Payer: Medicaid Other

## 2020-03-11 DIAGNOSIS — E669 Obesity, unspecified: Secondary | ICD-10-CM

## 2020-03-11 DIAGNOSIS — R0602 Shortness of breath: Secondary | ICD-10-CM

## 2020-03-11 DIAGNOSIS — J387 Other diseases of larynx: Secondary | ICD-10-CM | POA: Diagnosis present

## 2020-03-11 LAB — HIV ANTIBODY (ROUTINE TESTING W REFLEX): HIV Screen 4th Generation wRfx: NONREACTIVE

## 2020-03-11 LAB — ECHOCARDIOGRAM COMPLETE
Area-P 1/2: 2.69 cm2
Height: 65 in
S' Lateral: 2.3 cm
Weight: 3904.79 oz

## 2020-03-11 MED ORDER — NICOTINE 21 MG/24HR TD PT24
21.0000 mg | MEDICATED_PATCH | Freq: Every day | TRANSDERMAL | Status: DC
  Administered 2020-03-11 – 2020-04-16 (×36): 21 mg via TRANSDERMAL
  Filled 2020-03-11 (×37): qty 1

## 2020-03-11 MED ORDER — ORAL CARE MOUTH RINSE
15.0000 mL | Freq: Two times a day (BID) | OROMUCOSAL | Status: DC
  Administered 2020-03-11 – 2020-03-13 (×4): 15 mL via OROMUCOSAL

## 2020-03-11 MED ORDER — ADULT MULTIVITAMIN W/MINERALS CH
1.0000 | ORAL_TABLET | Freq: Every day | ORAL | Status: DC
  Administered 2020-03-16 – 2020-03-18 (×3): 1 via ORAL
  Filled 2020-03-11 (×3): qty 1

## 2020-03-11 MED ORDER — IPRATROPIUM-ALBUTEROL 0.5-2.5 (3) MG/3ML IN SOLN
3.0000 mL | Freq: Four times a day (QID) | RESPIRATORY_TRACT | Status: DC
  Administered 2020-03-11 – 2020-03-12 (×5): 3 mL via RESPIRATORY_TRACT
  Filled 2020-03-11 (×5): qty 3

## 2020-03-11 MED ORDER — ENSURE ENLIVE PO LIQD: 237.0000 mL | Freq: Two times a day (BID) | ORAL | Status: DC

## 2020-03-11 MED ORDER — DM-GUAIFENESIN ER 30-600 MG PO TB12
1.0000 | ORAL_TABLET | Freq: Two times a day (BID) | ORAL | Status: DC
  Administered 2020-03-11 – 2020-03-18 (×7): 1 via ORAL
  Filled 2020-03-11 (×8): qty 1

## 2020-03-11 MED ORDER — ALBUTEROL SULFATE (2.5 MG/3ML) 0.083% IN NEBU: 2.5000 mg | INHALATION_SOLUTION | Freq: Four times a day (QID) | RESPIRATORY_TRACT | Status: DC | PRN

## 2020-03-11 MED ORDER — IOHEXOL 300 MG/ML  SOLN
75.0000 mL | Freq: Once | INTRAMUSCULAR | Status: AC | PRN
  Administered 2020-03-11: 75 mL via INTRAVENOUS

## 2020-03-11 NOTE — Progress Notes (Signed)
Modified Barium Swallow Progress Note  Patient Details  Name: Veronica Horton MRN: [id number] Date of Birth: 12-08-1960  Today's Date: 03/11/2020  Modified Barium Swallow completed.  Full report located under Chart Review in the Imaging Section.  Brief recommendations include the following:  Clinical Impression  Pt presents with mild pharyngeal phase dysphagia, however appearance of anatomy appears edematous (epiglottis and aryepiglottic folds). Pt with limited dentition and requires extra time to masticate solids, swallow trigger generally at the level of the valleculae, Pt with blunted appearance of epiglottis with reduced deflection resulting in variable trace, flash penetration of thins during the swallow without aspiration and min vallecular residue with solids and brief stasis of barium tablet in valleculae. Pt with prominent cricopharyngeus. Recommend D3/mech soft and thin liquids, po medications whole in puree and follow with liquid wash or per Pt preference. Also strongly recommend additional imaging (neck CT) and ENT consult pending those results. SLP will follow pending results of imaging. Above to RN and MD.    Ricka Burdock Evaluation Recommendations   Recommended Consults: Consider ENT evaluation (consider neck CT)   SLP Diet Recommendations: Dysphagia 3 (Mech soft) solids;Thin liquid   Liquid Administration via: Cup;Straw   Medication Administration: Whole meds with puree   Supervision: Patient able to self feed   Compensations: Slow rate;Small sips/bites   Postural Changes: Remain semi-upright after after feeds/meals (Comment);Seated upright at 90 degrees   Oral Care Recommendations: Oral care BID   Other Recommendations: Clarify dietary restrictions   Thank you,  [name], [place]  [place] 03/11/2020,2:06 PM

## 2020-03-11 NOTE — Progress Notes (Signed)
*  PRELIMINARY RESULTS* Echocardiogram 2D Echocardiogram has been performed.  Leavy Cella 03/11/2020, 3:06 PM

## 2020-03-11 NOTE — Progress Notes (Signed)
Nutrition Follow-up  DOCUMENTATION CODES:   Morbid obesity  INTERVENTION:  Ensure Enlive po BID, each supplement provides 350 kcal and 20 grams of protein  MVI with minerals daily   NUTRITION DIAGNOSIS:   Increased nutrient needs related to chronic illness (chronic respiratory failure on 5 L O2 nightly; COPD) as evidenced by estimated needs.  GOAL:   Patient will meet greater than or equal to 90% of their needs    MONITOR:   PO intake, Supplement acceptance, Weight trends, Labs, I & O's, Skin  REASON FOR ASSESSMENT:   Consult Assessment of nutrition requirement/status  ASSESSMENT:   59 year old female with history significant of hiatal hernia, HTN, bronchitis, COPD, and chronic respiratory failure on 5 L oxygen at night, presents with AMS and worsening dyspnea over the last 4 days.  Pt discussed in rounds, noted on BiPAP over night and improving. She was NPO pending swallow evaluation and is s/p MBS this afternoon, diet advanced to D3 (mechanical soft) with thin liquids. Pt being transported off unit at RD attempt to see today. Discussed with RN, pt taken to CT for additional imagining of neck due to edematous appearance of epiglottis and aryepiglottic folds on MBS. RN reports a few bites of magic cup today. Suspect inadequate po intake prior to admission given worsening SOB. Will order Ensure supplement to aid with meeting needs and will plan to obtain nutrition history at follow-up.  Limited weight history for review, per chart weights have trended down ~48 lbs (16.3%) in the last 14 months; insignificant for time frame.   I/Os: -365.7 ml since admit UOP: 650 ml x 24 hrs Medications reviewed and include: Mucinex, Methylprednisolone, Protonix, Vancomycin  Labs reviewed  NUTRITION - FOCUSED PHYSICAL EXAM: Unable to complete at this time, pt off floor  Moderate pitting BLE edema per flowhsheet  Diet Order:   Diet Order            DIET DYS 3 Room service  appropriate? Yes; Fluid consistency: Thin  Diet effective now                 EDUCATION NEEDS:   Not appropriate for education at this time  Skin:  Skin Assessment: Skin Integrity Issues: Skin Integrity Issues:: Other (Comment) Other: Cellulits; LLE; MASD; R thigh  Last BM:  pta  Height:   Ht Readings from Last 1 Encounters:  03/10/20 5\' 5"  (1.651 m)    Weight:   Wt Readings from Last 1 Encounters:  03/10/20 110.7 kg    BMI:  Body mass index is 40.61 kg/m.  Estimated Nutritional Needs:   Kcal:  9201-0071 (MSJ x 1.3-1.4)  Protein:  110-125  Fluid:  > 2 L/day   Lajuan Lines, RD, LDN Clinical Nutrition After Hours/Weekend Pager # in [place]

## 2020-03-11 NOTE — Progress Notes (Signed)
PROGRESS NOTE    Veronica Horton  OMV:[id number] DOB: October 01, 1960 DOA: 03/10/2020 PCP: [facility]    Brief Narrative:  59 year old female with history of COPD, chronic respiratory failure on 5 L of oxygen, tobacco use, admitted to the hospital with altered mental status secondary to CO2 retention.  Patient was started on BiPAP and steroids, bronchodilators, antibiotics for COPD exacerbation.  Overall respiratory status/mental status has improved back to baseline.  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  She is being transferred to [facility] for ENT evaluation.   Assessment & Plan:   Active Problems:   Hypertension   Polycythemia   Class 3 obesity   Acute exacerbation of chronic obstructive pulmonary disease (COPD) (HCC)   Acute respiratory failure with hypercapnia (HCC)   Laryngeal mass   Acute on chronic respiratory failure with hypercapnia and hypoxia -Related to COPD exacerbation -Patient treated with antibiotics, steroids, bronchodilators -PCO2 elevated at 91 on admission -Clinically, mental status has improved with BiPAP -Patient is chronically on 5 L of oxygen -She has tolerated well off of BiPAP since this morning and is back on her home oxygen requirement  Acute metabolic encephalopathy -Secondary to CO2 retention -Mental status is back to baseline  Laryngeal mass -Patient reported significant coughing after trying to eat and drink -Seen by speech therapy and underwent MBSS where she was noted to have abnormal larynx -CT scan of the neck performed showed significant laryngeal mass -There is mention of possible airway compromise, but clinically she is not short of breath, does not have any stridor, and appears to be comfortable -ENT Dr. Constance Holster consulted and will evaluate the patient upon transfer to [facility]  Lower extremity edema -Suspect is related to venous stasis -Echocardiogram shows preserved  ejection fraction bilaterally -Respiratory status currently stable  Tobacco use -We will continue nicotine patch  Hypertension -Chronically on lisinopril -Blood pressures currently stable -Resume lisinopril as blood pressure allows  Class III obesity -Counseled on importance of diet and exercise   DVT prophylaxis: SCDs Start: 03/10/20 1625  Code Status: Full code Family Communication: At patient's request, try to update her sister over the phone.  Unable to reach her over the phone Disposition Plan: Status is: Inpatient.  Patient is being transferred to [facility] for further management  Remains inpatient appropriate because:Ongoing diagnostic testing needed not appropriate for outpatient work up   Dispo: The patient is from: Home              Anticipated d/c is to: Home              Anticipated d/c date is: 3 days              Patient currently is not medically stable to d/c.     Consultants:     Procedures:     Antimicrobials:   Doxycycline 11/10 >   Subjective: Overall shortness of breath is better.  She has been off of BiPAP since early this morning.  She does describe noticing coughing when she tries to eat or drink.  Objective: Vitals:   03/11/20 1523 03/11/20 1634 03/11/20 1700 03/11/20 1800  BP: 127/65  (!) 117/38 (!) 135/52  Pulse: 70 60 62 (!) 49  Resp:   12   Temp:  98.1 F (36.7 C)    TempSrc:  Oral    SpO2: 99% 96% 94% 96%  Weight:      Height:  Intake/Output Summary (Last 24 hours) at 03/11/2020 1933 Last data filed at 03/11/2020 1800 Gross per 24 hour  Intake 568.7 ml  Output 1150 ml  Net -581.3 ml   Filed Weights   03/10/20 1205 03/10/20 1645  Weight: 111.6 kg 110.7 kg    Examination:  General exam: Appears calm and comfortable, voice is very hoarse  Respiratory system: Clear to auscultation. Respiratory effort normal. Cardiovascular system: S1 & S2 heard, RRR. No JVD, murmurs, rubs, gallops or clicks. No  pedal edema. Gastrointestinal system: Abdomen is nondistended, soft and nontender. No organomegaly or masses felt. Normal bowel sounds heard. Central nervous system: Alert and oriented. No focal neurological deficits. Extremities: Symmetric 5 x 5 power. Skin: Venous stasis changes in lower extremities bilaterally Psychiatry: Judgement and insight appear normal. Mood & affect appropriate.     Data Reviewed: I have personally reviewed following labs and imaging studies  CBC: Recent Labs  Lab 03/10/20 1227  WBC 10.1  NEUTROABS 8.1*  HGB 14.5  HCT 47.2*  MCV 101.9*  PLT 254   Basic Metabolic Panel: Recent Labs  Lab 03/10/20 1226 03/10/20 1600  NA 137  --   K 4.6  --   CL 96*  --   CO2 34*  --   GLUCOSE 104*  --   BUN 16  --   CREATININE 1.01*  --   CALCIUM 8.6*  --   MG  --  2.3   GFR: Estimated Creatinine Clearance: 73.4 mL/min (A) (by C-G formula based on SCr of 1.01 mg/dL (H)). Liver Function Tests: Recent Labs  Lab 03/10/20 1226  AST 14*  ALT 12  ALKPHOS 79  BILITOT 0.8  PROT 7.3  ALBUMIN 3.6   No results for input(s): LIPASE, AMYLASE in the last 168 hours. No results for input(s): AMMONIA in the last 168 hours. Coagulation Profile: No results for input(s): INR, PROTIME in the last 168 hours. Cardiac Enzymes: No results for input(s): CKTOTAL, CKMB, CKMBINDEX, TROPONINI in the last 168 hours. BNP (last 3 results) No results for input(s): PROBNP in the last 8760 hours. HbA1C: No results for input(s): HGBA1C in the last 72 hours. CBG: No results for input(s): GLUCAP in the last 168 hours. Lipid Profile: No results for input(s): CHOL, HDL, LDLCALC, TRIG, CHOLHDL, LDLDIRECT in the last 72 hours. Thyroid Function Tests: No results for input(s): TSH, T4TOTAL, FREET4, T3FREE, THYROIDAB in the last 72 hours. Anemia Panel: No results for input(s): VITAMINB12, FOLATE, FERRITIN, TIBC, IRON, RETICCTPCT in the last 72 hours. Sepsis Labs: Recent Labs  Lab  03/10/20 1227 03/10/20 1405  LATICACIDVEN 0.6 0.6    Recent Results (from the past 240 hour(s))  Respiratory Panel by RT PCR (Flu A&B, Covid) - Nasopharyngeal Swab     Status: None   Collection Time: 03/10/20 12:26 PM   Specimen: Nasopharyngeal Swab  Result Value Ref Range Status   SARS Coronavirus 2 by RT PCR NEGATIVE NEGATIVE Final    Comment: (NOTE) SARS-CoV-2 target nucleic acids are NOT DETECTED.  The SARS-CoV-2 RNA is generally detectable in upper respiratoy specimens during the acute phase of infection. The lowest concentration of SARS-CoV-2 viral copies this assay can detect is 131 copies/mL. A negative result does not preclude SARS-Cov-2 infection and should not be used as the sole basis for treatment or other patient management decisions. A negative result may occur with  improper specimen collection/handling, submission of specimen other than nasopharyngeal swab, presence of viral mutation(s) within the areas targeted by this assay, and inadequate  number of viral copies (<131 copies/mL). A negative result must be combined with clinical observations, patient history, and epidemiological information. The expected result is Negative.  Fact Sheet for Patients:  PinkCheek.be  Fact Sheet for Healthcare Providers:  GravelBags.it  This test is no t yet approved or cleared by the [place] FDA and  has been authorized for detection and/or diagnosis of SARS-CoV-2 by FDA under an Emergency Use Authorization (EUA). This EUA will remain  in effect (meaning this test can be used) for the duration of the COVID-19 declaration under Section 564(b)(1) of the Act, 21 U.S.C. section 360bbb-3(b)(1), unless the authorization is terminated or revoked sooner.     Influenza A by PCR NEGATIVE NEGATIVE Final   Influenza B by PCR NEGATIVE NEGATIVE Final    Comment: (NOTE) The Xpert Xpress SARS-CoV-2/FLU/RSV assay is intended as  an aid in  the diagnosis of influenza from Nasopharyngeal swab specimens and  should not be used as a sole basis for treatment. Nasal washings and  aspirates are unacceptable for Xpert Xpress SARS-CoV-2/FLU/RSV  testing.  Fact Sheet for Patients: PinkCheek.be  Fact Sheet for Healthcare Providers: GravelBags.it  This test is not yet approved or cleared by the [place] FDA and  has been authorized for detection and/or diagnosis of SARS-CoV-2 by  FDA under an Emergency Use Authorization (EUA). This EUA will remain  in effect (meaning this test can be used) for the duration of the  Covid-19 declaration under Section 564(b)(1) of the Act, 21  U.S.C. section 360bbb-3(b)(1), unless the authorization is  terminated or revoked. Performed at [facility], [address], [place], [place] [address]   MRSA PCR Screening     Status: None   Collection Time: 03/10/20  5:08 PM   Specimen: Nasopharyngeal  Result Value Ref Range Status   MRSA by PCR NEGATIVE NEGATIVE Final    Comment:        The GeneXpert MRSA Assay (FDA approved for NASAL specimens only), is one component of a comprehensive MRSA colonization surveillance program. It is not intended to diagnose MRSA infection nor to guide or monitor treatment for MRSA infections. Performed at [facility], [address] [place], [place] [address]          Radiology Studies: CT SOFT TISSUE NECK W CONTRAST  Addendum Date: 03/11/2020   ADDENDUM REPORT: 03/11/2020 16:36 ADDENDUM: Study discussed by telephone with Dr. Jolaine Artist Juel Ripley on 03/11/2020 at 1631 hours. Electronically Signed   By: Genevie Ann M.D.   On: 03/11/2020 16:36   Result Date: 03/11/2020 CLINICAL DATA:  59 year old female with dysphagia. Tonsillitis suspected. EXAM: CT NECK WITH CONTRAST TECHNIQUE: Multidetector CT imaging of the neck was performed using the standard protocol following the bolus administration of  intravenous contrast. CONTRAST:  44mL OMNIPAQUE IOHEXOL 300 MG/ML  SOLN COMPARISON:  Chest CT 01/28/2019. FINDINGS: Pharynx and larynx: Bulky soft tissue mass throughout the bilateral supraglottic larynx and affecting the hypopharynx. Lobulated diffuse thickening of the epiglottis (series 2, image 53). Diffuse false cord and anterior commissure involvement with evidence of extension into the right paraglottic space on series 2 image 61 - where nodular direct extension of tumor and/or inseparable abnormal right level 3 lymph node protrudes on coronal image 58. Asymmetric erosion of the undersurface of the right thyroid cartilage on image 65. Asymmetric sclerosis of the right arytenoid on image 67., and midline extension of tumor is suspected through the anterior commissure a distance of about 11 mm as seen on series 2, image 67  and sagittal image 48. All told, tumor size is estimated at 37 x 52 by 45 mm (AP by transverse by CC). There is evidence of airway compromise. The true cords may be spared as seen on series 2, image 71. Subglottic larynx is within normal limits. Above the vallecula pharyngeal contours appear normal. Normal superior parapharyngeal and retropharyngeal spaces. Salivary glands: Negative sublingual space. Submandibular glands and parotid glands remain within normal limits. Thyroid: Negative. Lymph nodes: Malignant left level IIIb lymph node measures 17 mm short axis and 29 mm long axis (series 2, image 55 and coronal image 73). As stated above it is possible of malignant right level IIIa lymph node is inseparable from the parent tumor along the right paraglottic space (coronal image 58). Smaller but asymmetrically enlarged left level 4 nodes measure up to 9 mm short axis (coronal image 72). No other abnormal or suspicious lymph nodes identified. Vascular: Major vascular structures in the neck and at the skull base remain patent including both internal jugular veins. The left vertebral artery  appears dominant. Calcified atherosclerosis at the skull base. Limited intracranial: Negative. Visualized orbits: Negative. Mastoids and visualized paranasal sinuses: Clear. Skeleton: Absent and carious dentition. Cervical spine degeneration. No suspicious osseous lesion identified. 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 No superior mediastinal lymphadenopathy. Visible axillary lymph nodes are normal. Mild dependent atelectasis.  No upper lung nodule identified. IMPRESSION: 1. 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 Recommend ENT consultation. Involvement of right laryngeal cartilages, with extension in the midline anterior to the strap muscles, and also extension through the right paraglottic space and/or inseparable malignant right level IIIa lymph node. Estimated tumor long axis 5.2 cm. 2. Malignant contralateral left level 3b lymph node is 2.9 cm long axis. Smaller indeterminate left level 4 lymph nodes. 3. No distant metastatic disease identified in the neck or upper chest. Electronically Signed: By: Genevie Ann M.D. On: 03/11/2020 16:23   DG Chest Portable 1 View  Result Date: 03/10/2020 CLINICAL DATA:  Shortness of breath and cough EXAM: PORTABLE CHEST 1 VIEW COMPARISON:  January 23, 2020 FINDINGS: There is bibasilar atelectatic change. Elsewhere the interstitium is mildly thickened. No consolidation. Heart size and pulmonary vascularity are normal. No adenopathy. There is degenerative change in each shoulder. IMPRESSION: Bibasilar atelectasis. Interstitial thickening likely represents a degree of underlying chronic bronchitis. No edema or airspace opacity. Heart size within normal limits. Electronically Signed   By: Lowella Grip III M.D.   On: 03/10/2020 12:42   DG Swallowing Func-Speech Pathology  Result Date: 03/11/2020 Objective Swallowing Evaluation: Type of Study: MBS-Modified Barium Swallow Study  Patient Details Name: Veronica Horton MRN: [id number] Date of Birth: January 27, 1961 Today's Date: 03/11/2020 Time: SLP Start Time (ACUTE ONLY): 4196 -SLP Stop Time (ACUTE ONLY): 2229 SLP Time Calculation (min) (ACUTE ONLY): 21 min Past Medical History: Past Medical History: Diagnosis Date  Bronchitis   Class 3 obesity 01/23/2020  COPD (chronic obstructive pulmonary disease) ([place])   Degenerative disc disease, lumbar   Hiatal hernia   Hypertension  Past Surgical History: Past Surgical History: Procedure Laterality Date  CHOLECYSTECTOMY    degenerative bone disease    INCISIONAL HERNIA REPAIR N/A 01/15/2019  Procedure: [name] HERNIORRHAPHY WITH MESH;  Surgeon: [name], MD;  Location: [facility];  Service: General;  Laterality: N/A;  OMENTECTOMY N/A 01/15/2019  Procedure: OMENTECTOMY;  Surgeon: [name], MD;  Location: [facility];  Service: General;  Laterality: N/A; HPI: Veronica Horton  is a 59 y.o.  female, with medical history significant of class III obesity, hiatal hernia, hypertension, bronchitis, COPD, chronic respiratory failure on home oxygen at 5 LPM, active smoker of 1-1/2 packs of cigarettes per day , patient presents to ED secondary to shortness of breath and altered mental status, patient with receptive worsening dyspnea over the last 4 days, she is not vaccinated against Covid, she is altered at the time of my examination, history was obtained from boyfriend at bedside, and ED staff, patient still smoking, she still using 5 L of oxygen, he is with increased work of breathing, significantly dyspneic, upon presentation to ED she is altered not provide any complaints at this point. BSE requested.  Subjective: "I haven't been able to swallow my pills for a couple weeks." Assessment / Plan / Recommendation CHL IP CLINICAL IMPRESSIONS 03/11/2020 Clinical Impression Pt presents with mild pharyngeal phase dysphagia, however appearance of anatomy appears edematous (epiglottis and aryepiglottic folds). Pt with limited dentition and requires  extra time to masticate solids, swallow trigger generally at the level of the valleculae, Pt with blunted appearance of epiglottis with reduced deflection resulting in variable trace, flash penetration of thins during the swallow without aspiration and min vallecular residue with solids and brief stasis of barium tablet in valleculae. Pt with prominent cricopharyngeus. Recommend D3/mech soft and thin liquids, po medications whole in puree and follow with liquid wash or per Pt preference. Also strongly recommend additional imaging (neck CT) and ENT consult pending those results. SLP will follow pending results of imaging. Above to RN and MD.  SLP Visit Diagnosis Dysphagia, oropharyngeal phase (R13.12) Attention and concentration deficit following -- Frontal lobe and executive function deficit following -- Impact on safety and function Mild aspiration risk   CHL IP TREATMENT RECOMMENDATION 03/11/2020 Treatment Recommendations No treatment recommended at this time   Prognosis 03/11/2020 Prognosis for Safe Diet Advancement Fair Barriers to Reach Goals Severity of deficits Barriers/Prognosis Comment -- CHL IP DIET RECOMMENDATION 03/11/2020 SLP Diet Recommendations Dysphagia 3 (Mech soft) solids;Thin liquid Liquid Administration via Cup;Straw Medication Administration Whole meds with puree Compensations Slow rate;Small sips/bites Postural Changes Remain semi-upright after after feeds/meals (Comment);Seated upright at 90 degrees   CHL IP OTHER RECOMMENDATIONS 03/11/2020 Recommended Consults Consider ENT evaluation Oral Care Recommendations Oral care BID Other Recommendations Clarify dietary restrictions   CHL IP FOLLOW UP RECOMMENDATIONS 03/11/2020 Follow up Recommendations None   CHL IP FREQUENCY AND DURATION 03/11/2020 Speech Therapy Frequency (ACUTE ONLY) min 2x/week Treatment Duration 1 week      CHL IP ORAL PHASE 03/11/2020 Oral Phase WFL Oral - Pudding Teaspoon -- Oral - Pudding Cup -- Oral - Honey Teaspoon -- Oral -  Honey Cup -- Oral - Nectar Teaspoon -- Oral - Nectar Cup -- Oral - Nectar Straw -- Oral - Thin Teaspoon -- Oral - Thin Cup -- Oral - Thin Straw -- Oral - Puree -- Oral - Mech Soft -- Oral - Regular -- Oral - Multi-Consistency -- Oral - Pill -- Oral Phase - Comment --  CHL IP PHARYNGEAL PHASE 03/11/2020 Pharyngeal Phase Impaired Pharyngeal- Pudding Teaspoon -- Pharyngeal -- Pharyngeal- Pudding Cup -- Pharyngeal -- Pharyngeal- Honey Teaspoon -- Pharyngeal -- Pharyngeal- Honey Cup -- Pharyngeal -- Pharyngeal- Nectar Teaspoon -- Pharyngeal -- Pharyngeal- Nectar Cup -- Pharyngeal -- Pharyngeal- Nectar Straw Pharyngeal residue - valleculae;Reduced epiglottic inversion Pharyngeal -- Pharyngeal- Thin Teaspoon Delayed swallow initiation-vallecula;Reduced epiglottic inversion Pharyngeal -- Pharyngeal- Thin Cup Reduced epiglottic inversion;Penetration/Aspiration during swallow Pharyngeal Material enters airway, remains ABOVE vocal cords then ejected out Pharyngeal-  Thin Straw Reduced epiglottic inversion;Penetration/Aspiration during swallow Pharyngeal Material enters airway, remains ABOVE vocal cords then ejected out Pharyngeal- Puree WFL Pharyngeal -- Pharyngeal- Mechanical Soft -- Pharyngeal -- Pharyngeal- Regular Pharyngeal residue - valleculae;Reduced epiglottic inversion Pharyngeal -- Pharyngeal- Multi-consistency -- Pharyngeal -- Pharyngeal- Pill Pharyngeal residue - valleculae;Reduced epiglottic inversion Pharyngeal -- Pharyngeal Comment edematous pharynx  CHL IP CERVICAL ESOPHAGEAL PHASE 03/11/2020 Cervical Esophageal Phase Impaired Pudding Teaspoon -- Pudding Cup -- Honey Teaspoon -- Honey Cup -- Nectar Teaspoon -- Nectar Cup -- Nectar Straw -- Thin Teaspoon -- Thin Cup Prominent cricopharyngeal segment Thin Straw -- Puree -- Mechanical Soft -- Regular -- Multi-consistency -- Pill -- Cervical Esophageal Comment -- Thank you, [name], [place] Sevier 03/11/2020, 2:12 PM               ECHOCARDIOGRAM COMPLETE  Result Date: 03/11/2020    ECHOCARDIOGRAM REPORT   Patient Name:   Veronica Horton Date of Exam: 03/11/2020 Medical Rec #:  [id number]       Height:       65.0 in Accession #:    [id number]      Weight:       244.0 lb Date of Birth:  [birth year]       BSA:          2.153 m Patient Age:    50 years        BP:           115/35 mmHg Patient Gender: F               HR:           57 bpm. Exam Location:  [facility] Procedure: 2D Echo Indications:    Dyspnea 786.09 / R06.00  History:        Patient has prior history of Echocardiogram examinations, most                 recent 01/16/2019. COPD; Risk Factors:Hypertension and Current                 Smoker. Acute respiratory failure with hypoxia.  Sonographer:    Leavy Cella RDCS (AE) Referring Phys: [id number] [address] [place] IMPRESSIONS  1. Left ventricular ejection fraction, by estimation, is 70 to 75%. The left ventricle has hyperdynamic function. The left ventricle has no regional wall motion abnormalities. There is mild left ventricular hypertrophy. Left ventricular diastolic parameters were normal.  2. Right ventricular systolic function is normal. The right ventricular size is normal. Tricuspid regurgitation signal is inadequate for assessing PA pressure.  3. The mitral valve is grossly normal. Trivial mitral valve regurgitation.  4. The aortic valve is tricuspid. Aortic valve regurgitation is not visualized.  5. The inferior vena cava is normal in size with greater than 50% respiratory variability, suggesting right atrial pressure of 3 mmHg. FINDINGS  Left Ventricle: Left ventricular ejection fraction, by estimation, is 70 to 75%. The left ventricle has hyperdynamic function. The left ventricle has no regional wall motion abnormalities. The left ventricular internal cavity size was normal in size. There is mild left ventricular hypertrophy. Left ventricular diastolic parameters were normal. Right Ventricle: The right ventricular size is  normal. No increase in right ventricular wall thickness. Right ventricular systolic function is normal. Tricuspid regurgitation signal is inadequate for assessing PA pressure. Left Atrium: Left atrial size was normal in size. Right Atrium: Right atrial size was normal in size. Pericardium: There is no evidence of pericardial effusion. Mitral Valve: The mitral  valve is grossly normal. Trivial mitral valve regurgitation. Tricuspid Valve: The tricuspid valve is grossly normal. Tricuspid valve regurgitation is trivial. Aortic Valve: The aortic valve is tricuspid. Aortic valve regurgitation is not visualized. Pulmonic Valve: The pulmonic valve was not well visualized. Pulmonic valve regurgitation is not visualized. Aorta: The aortic root is normal in size and structure. Venous: The inferior vena cava is normal in size with greater than 50% respiratory variability, suggesting right atrial pressure of 3 mmHg. IAS/Shunts: No atrial level shunt detected by color flow Doppler.  LEFT VENTRICLE PLAX 2D LVIDd:         4.05 cm  Diastology LVIDs:         2.30 cm  LV e' medial:    8.59 cm/s LV PW:         1.35 cm  LV E/e' medial:  12.2 LV IVS:        1.34 cm  LV e' lateral:   11.00 cm/s LVOT diam:     2.00 cm  LV E/e' lateral: 9.5 LVOT Area:     3.14 cm  RIGHT VENTRICLE RV S prime:     15.40 cm/s TAPSE (M-mode): 2.9 cm LEFT ATRIUM             Index       RIGHT ATRIUM           Index LA diam:        4.70 cm 2.18 cm/m  RA Area:     13.50 cm LA Vol (A2C):   73.3 ml 34.05 ml/m RA Volume:   35.40 ml  16.44 ml/m LA Vol (A4C):   39.3 ml 18.26 ml/m LA Biplane Vol: 55.6 ml 25.83 ml/m   AORTA Ao Root diam: 2.70 cm MITRAL VALVE MV Area (PHT): 2.69 cm     SHUNTS MV Decel Time: 282 msec     Systemic Diam: 2.00 cm MV E velocity: 105.00 cm/s MV A velocity: 57.80 cm/s MV E/A ratio:  1.82 Rozann Lesches MD Electronically signed by Rozann Lesches MD Signature Date/Time: 03/11/2020/4:59:31 PM    Final         Scheduled Meds:   Chlorhexidine Gluconate Cloth  6 each Topical Daily   dextromethorphan-guaiFENesin  1 tablet Oral BID   enoxaparin (LOVENOX) injection  55 mg Subcutaneous Q24H   [START ON 03/12/2020] feeding supplement  237 mL Oral BID BM   fluticasone furoate-vilanterol  1 puff Inhalation Daily   ipratropium-albuterol  3 mL Nebulization Q6H   mouth rinse  15 mL Mouth Rinse BID   methylPREDNISolone (SOLU-MEDROL) injection  40 mg Intravenous Q6H   [START ON 03/12/2020] multivitamin with minerals  1 tablet Oral Daily   nicotine  21 mg Transdermal Daily   pantoprazole (PROTONIX) IV  40 mg Intravenous Q24H   Continuous Infusions:  doxycycline (VIBRAMYCIN) IV 125 mL/hr at 03/11/20 1800     LOS: 1 day    Time spent: 22mins    Kathie Dike, MD [facility]   If 7PM-7AM, please contact night-coverage [email or web address]  03/11/2020, 7:33 PM

## 2020-03-11 NOTE — Plan of Care (Signed)
  Problem: Acute Rehab PT Goals(only PT should resolve) Goal: Pt Will Go Supine/Side To Sit Outcome: Progressing Flowsheets (Taken 03/11/2020 1227) Pt will go Supine/Side to Sit:  Independently  with modified independence Goal: Patient Will Transfer Sit To/From Stand Outcome: Progressing Flowsheets (Taken 03/11/2020 1227) Patient will transfer sit to/from stand: with supervision Goal: Pt Will Transfer Bed To Chair/Chair To Bed Outcome: Progressing Flowsheets (Taken 03/11/2020 1227) Pt will Transfer Bed to Chair/Chair to Bed: with supervision Goal: Pt Will Ambulate Outcome: Progressing Flowsheets (Taken 03/11/2020 1227) Pt will Ambulate:  50 feet  with supervision  with rolling walker  with min guard assist   12:28 PM, 03/11/20 Lonell Grandchild, MPT Physical Therapist with [facility] [phone] office [phone] mobile phone

## 2020-03-11 NOTE — Evaluation (Signed)
Physical Therapy Evaluation Patient Details Name: Veronica Horton MRN: [id number] DOB: 1960/12/16 Today's Date: 03/11/2020   History of Present Illness  Veronica Horton  is a 59 y.o. female, with medical history significant of class III obesity, hiatal hernia, hypertension, bronchitis, COPD, chronic respiratory failure on home oxygen at 5 LPM, active smoker of 1-1/2 packs of cigarettes per day , patient presents to ED secondary to shortness of breath and altered mental status, patient with receptive worsening dyspnea over the last 4 days, she is not vaccinated against Covid, she is altered at the time of my examination, history was obtained from boyfriend at bedside, and ED staff, patient still smoking, she still using 5 L of oxygen, he is with increased work of breathing, significantly dyspneic, upon presentation to ED she is altered not provide any complaints at this point.- in ED chest x-ray was significant for no opacity or volume overload, her ABG was significant for pH of 7.2, PCO2 of 92 for which she is started on BiPAP, upon within normal range, creatinine at baseline at 1, globin is stable at 14.5 (she is with known history of polycythemia), she was started on IV steroids, and IV Lasix given she has significant lower extremity edema.    Clinical Impression  Patient has to lean on nearby objects for support when standing, able to take steps forward/backwards at bedside up to 6-7 minutes before having to sit, on 5 LPM with SpO2 dropping from 94% to 85% when walking and tolerated staying up in chair after therapy - RN aware.  Patient will benefit from continued physical therapy in hospital and recommended venue below to increase strength, balance, endurance for safe ADLs and gait.    Follow Up Recommendations Home health PT;Supervision for mobility/OOB;Supervision - Intermittent    Equipment Recommendations  None recommended by PT    Recommendations for Other Services       Precautions /  Restrictions Precautions Precautions: Fall Restrictions Weight Bearing Restrictions: No      Mobility  Bed Mobility Overal bed mobility: Needs Assistance Bed Mobility: Supine to Sit     Supine to sit: Supervision     General bed mobility comments: slightly labored movement, increase time    Transfers Overall transfer level: Needs assistance Equipment used: Rolling walker (2 wheeled);None Transfers: Sit to/from [employer] to Stand: Min guard Stand pivot transfers: Min guard       General transfer comment: had to lean on nearby objects for support when not using AD, safer using RW  Ambulation/Gait Ambulation/Gait assistance: Min guard;Min assist Gait Distance (Feet): 30 Feet Assistive device: Rolling walker (2 wheeled) Gait Pattern/deviations: Decreased step length - right;Decreased step length - left;Decreased stride length;Trunk flexed Gait velocity: decreased   General Gait Details: able to ambulate forward/backwards at bedside with slow labored cadence without loss of balance, limited mostly due to fatigue with SpO2 dropping from 94% to 85%  Stairs            Wheelchair Mobility    Modified Rankin (Stroke Patients Only)       Balance Overall balance assessment: Needs assistance Sitting-balance support: Feet supported;No upper extremity supported Sitting balance-Leahy Scale: Fair Sitting balance - Comments: fair/good seated at EOB   Standing balance support: During functional activity;No upper extremity supported Standing balance-Leahy Scale: Fair Standing balance comment: fair/poor without AD, fair using RW  Pertinent Vitals/Pain Pain Assessment: No/denies pain    Home Living Family/patient expects to be discharged to:: Private residence Living Arrangements: Non-relatives/Friends;Other relatives (aunt) Available Help at Discharge: Friend(s);Available 24 hours/day;Family Type of Home:  Mobile home Home Access: Ramped entrance     Home Layout: One level Home Equipment: [place] - 2 wheels;Cane - single point      Prior Function Level of Independence: Independent with assistive device(s)         Comments: Household and short distanced community ambulator using [facility]     Hand Dominance        Extremity/Trunk Assessment   Upper Extremity Assessment Upper Extremity Assessment: Overall WFL for tasks assessed    Lower Extremity Assessment Lower Extremity Assessment: Generalized weakness    Cervical / Trunk Assessment Cervical / Trunk Assessment: Kyphotic  Communication   Communication: No difficulties  Cognition Arousal/Alertness: Awake/alert Behavior During Therapy: WFL for tasks assessed/performed Overall Cognitive Status: Within Functional Limits for tasks assessed                                        General Comments      Exercises     Assessment/Plan    PT Assessment Patient needs continued PT services  PT Problem List Decreased strength;Decreased activity tolerance;Decreased balance;Decreased mobility       PT Treatment Interventions DME instruction;Gait training;Stair training;Functional mobility training;Therapeutic activities;Therapeutic exercise;Patient/family education;Balance training    PT Goals (Current goals can be found in the Care Plan section)  Acute Rehab PT Goals Patient Stated Goal: return home with family to assist PT Goal Formulation: With patient Time For Goal Achievement: 03/18/20 Potential to Achieve Goals: Good    Frequency Min 3X/week   Barriers to discharge        Co-evaluation               AM-PAC PT "6 Clicks" Mobility  Outcome Measure Help needed turning from your back to your side while in a flat bed without using bedrails?: None Help needed moving from lying on your back to sitting on the side of a flat bed without using bedrails?: None Help needed moving to and from a bed to a  chair (including a wheelchair)?: A Little Help needed standing up from a chair using your arms (e.g., wheelchair or bedside chair)?: A Little Help needed to walk in hospital room?: A Little Help needed climbing 3-5 steps with a railing? : A Lot 6 Click Score: 19    End of Session Equipment Utilized During Treatment: Oxygen Activity Tolerance: Patient tolerated treatment well;Patient limited by fatigue Patient left: in chair;with call bell/phone within reach Nurse Communication: Mobility status PT Visit Diagnosis: Unsteadiness on feet (R26.81);Other abnormalities of gait and mobility (R26.89);Muscle weakness (generalized) (M62.81)    Time: 2119-4174 PT Time Calculation (min) (ACUTE ONLY): 31 min   Charges:   PT Evaluation $PT Eval Moderate Complexity: 1 Mod PT Treatments $Therapeutic Activity: 23-37 mins        12:25 PM, 03/11/20 Lonell Grandchild, MPT Physical Therapist with [facility] [phone] office [phone] mobile phone

## 2020-03-11 NOTE — Evaluation (Signed)
Clinical/Bedside Swallow Evaluation Patient Details  Name: Veronica Horton MRN: [id number] Date of Birth: 11-20-1960  Today's Date: 03/11/2020 Time: SLP Start Time (ACUTE ONLY): 1000 SLP Stop Time (ACUTE ONLY): 1028 SLP Time Calculation (min) (ACUTE ONLY): 28 min  Past Medical History:  Past Medical History:  Diagnosis Date  . Bronchitis   . Class 3 obesity 01/23/2020  . COPD (chronic obstructive pulmonary disease) ([place])   . Degenerative disc disease, lumbar   . Hiatal hernia   . Hypertension    Past Surgical History:  Past Surgical History:  Procedure Laterality Date  . CHOLECYSTECTOMY    . degenerative bone disease    . INCISIONAL HERNIA REPAIR N/A 01/15/2019   Procedure: Veronica Horton HERNIORRHAPHY WITH MESH;  Surgeon: [name], MD;  Location: [facility];  Service: General;  Laterality: N/A;  . OMENTECTOMY N/A 01/15/2019   Procedure: OMENTECTOMY;  Surgeon: [name], MD;  Location: [facility];  Service: General;  Laterality: N/A;   HPI:  Veronica Horton  is a 59 y.o. female, with medical history significant of class III obesity, hiatal hernia, hypertension, bronchitis, COPD, chronic respiratory failure on home oxygen at 5 LPM, active smoker of 1-1/2 packs of cigarettes per day , patient presents to ED secondary to shortness of breath and altered mental status, patient with receptive worsening dyspnea over the last 4 days, she is not vaccinated against Covid, she is altered at the time of my examination, history was obtained from boyfriend at bedside, and ED staff, patient still smoking, she still using 5 L of oxygen, he is with increased work of breathing, significantly dyspneic, upon presentation to ED she is altered not provide any complaints at this point. BSE requested.   Assessment / Plan / Recommendation Clinical Impression  Clinical swallow evaluation completed with Pt sitting up in chair. Pt with sparse dentition and indicates that she has difficulty chewing meats. Oral motor  examination is otherwise WNL. Pt with hoarse, harsh vocal quality, which she states started about 3 weeks ago. Pt shows no overt signs or symptoms of aspiration clinically, however she is at risk for aspiration given respiratory compromise/COPD. Pt does indicate acute onset difficulty swallowing pills (~3 weeks) and has to dissolve them in order to swallow. She reports that they "stick" in her throat. Given severity of COPD, harsh vocal quality, and globus sensation in pharynx with pills, will proceed with MBSS to objectively evaluate swallow. Above to RN>   SLP Visit Diagnosis: Dysphagia, unspecified (R13.10)    Aspiration Risk  Mild aspiration risk;Moderate aspiration risk    Diet Recommendation Dysphagia 3 (Mech soft);Thin liquid   Liquid Administration via: Straw;Cup Medication Administration: Crushed with puree Supervision: Patient able to self feed;Intermittent supervision to cue for compensatory strategies Compensations: Slow rate;Small sips/bites Postural Changes: Seated upright at 90 degrees;Remain upright for at least 30 minutes after po intake    Other  Recommendations Oral Care Recommendations: Oral care BID Other Recommendations: Clarify dietary restrictions   Follow up Recommendations None      Frequency and Duration min 2x/week  1 week       Prognosis Prognosis for Safe Diet Advancement: Fair Barriers to Reach Goals: Severity of deficits      Swallow Study   General Date of Onset: 03/10/20 HPI: Veronica Horton  is a 59 y.o. female, with medical history significant of class III obesity, hiatal hernia, hypertension, bronchitis, COPD, chronic respiratory failure on home oxygen at 5 LPM, active smoker of 1-1/2 packs of cigarettes per day ,  patient presents to ED secondary to shortness of breath and altered mental status, patient with receptive worsening dyspnea over the last 4 days, she is not vaccinated against Covid, she is altered at the time of my examination, history  was obtained from boyfriend at bedside, and ED staff, patient still smoking, she still using 5 L of oxygen, he is with increased work of breathing, significantly dyspneic, upon presentation to ED she is altered not provide any complaints at this point. BSE requested. Type of Study: Bedside Swallow Evaluation Previous Swallow Assessment: None on record Diet Prior to this Study: NPO Temperature Spikes Noted: No Respiratory Status: Nasal cannula History of Recent Intubation: No Behavior/Cognition: Alert;Cooperative;Pleasant mood Oral Cavity Assessment: Within Functional Limits Oral Care Completed by SLP: Recent completion by staff Oral Cavity - Dentition: Missing dentition;Poor condition Vision: Functional for self-feeding Self-Feeding Abilities: Able to feed self Patient Positioning: Upright in chair Baseline Vocal Quality: Hoarse Volitional Cough: Strong;Congested Volitional Swallow: Able to elicit    Oral/Motor/Sensory Function Overall Oral Motor/Sensory Function: Within functional limits   Ice Chips Ice chips: Within functional limits Presentation: Spoon   Thin Liquid Thin Liquid: Within functional limits Presentation: Cup;Self Fed;Straw    Nectar Thick Nectar Thick Liquid: Not tested   Honey Thick Honey Thick Liquid: Not tested   Puree Puree: Within functional limits Presentation: Spoon;Self Fed   Solid     Solid: Impaired Presentation: Self Fed Oral Phase Impairments: Impaired mastication Oral Phase Functional Implications: Prolonged oral transit     Thank you,  Veronica Horton, [place]  Veronica Horton 03/11/2020,10:28 AM

## 2020-03-12 LAB — BASIC METABOLIC PANEL
Anion gap: 13 (ref 5–15)
BUN: 33 mg/dL — ABNORMAL HIGH (ref 6–20)
CO2: 32 mmol/L (ref 22–32)
Calcium: 9.7 mg/dL (ref 8.9–10.3)
Chloride: 95 mmol/L — ABNORMAL LOW (ref 98–111)
Creatinine, Ser: 1.1 mg/dL — ABNORMAL HIGH (ref 0.44–1.00)
GFR, Estimated: 58 mL/min — ABNORMAL LOW (ref >60)
Glucose, Bld: 131 mg/dL — ABNORMAL HIGH (ref 70–99)
Potassium: 4 mmol/L (ref 3.5–5.1)
Sodium: 140 mmol/L (ref 135–145)

## 2020-03-12 LAB — CBC
HCT: 48.4 % — ABNORMAL HIGH (ref 36.0–46.0)
Hemoglobin: 15 g/dL (ref 12.0–15.0)
MCH: 31.2 pg (ref 26.0–34.0)
MCHC: 31 g/dL (ref 30.0–36.0)
MCV: 100.6 fL — ABNORMAL HIGH (ref 80.0–100.0)
Platelets: 248 10*3/uL (ref 150–400)
RBC: 4.81 MIL/uL (ref 3.87–5.11)
RDW: 14.6 % (ref 11.5–15.5)
WBC: 18.7 10*3/uL — ABNORMAL HIGH (ref 4.0–10.5)
nRBC: 0 % (ref 0.0–0.2)

## 2020-03-12 NOTE — Progress Notes (Signed)
PROGRESS NOTE    Veronica Horton  QQP:[id number] DOB: 04/11/1961 DOA: 03/10/2020 PCP: [facility]    Brief Narrative:  59 year old female with history of COPD, chronic respiratory failure on 5 L of oxygen, tobacco use, admitted to the hospital with altered mental status secondary to CO2 retention.  Patient was started on BiPAP and steroids, bronchodilators, antibiotics for COPD exacerbation.  Overall respiratory status/mental status has improved back to baseline.  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  She is being transferred to [facility] for ENT evaluation.   Assessment & Plan:   Active Problems:   Hypertension   Polycythemia   Class 3 obesity   Acute exacerbation of chronic obstructive pulmonary disease (COPD) (HCC)   Acute respiratory failure with hypercapnia (HCC)   Laryngeal mass   Acute on chronic respiratory failure with hypercapnia and hypoxia -Related to COPD exacerbation -Patient treated with antibiotics, steroids, bronchodilators -PCO2 elevated at 91 on admission -Clinically, mental status has improved with BiPAP -Patient is chronically on 5 L of oxygen -Bipap discontinued on 11/11 and she appears to be tolerating nasal cannula  Acute metabolic encephalopathy -Secondary to CO2 retention -Mental status is back to baseline  Laryngeal mass -Patient reported significant coughing after trying to eat and drink -Seen by speech therapy and underwent MBSS where she was noted to have abnormal larynx -CT scan of the neck performed showed significant laryngeal mass -There is mention of possible airway compromise, but clinically she is not short of breath, does not have any stridor, and appears to be comfortable -ENT Dr. Constance Holster consulted and will evaluate the patient upon transfer to [facility]  Lower extremity edema -Suspect is related to venous stasis -Echocardiogram shows preserved ejection fraction  bilaterally -Respiratory status currently stable  Tobacco use -We will continue nicotine patch  Hypertension -Chronically on lisinopril -Blood pressures currently stable -Resume lisinopril as blood pressure allows  Class III obesity -Counseled on importance of diet and exercise   DVT prophylaxis: SCDs Start: 03/10/20 1625  Code Status: Full code Family Communication: At patient's request, try to update her sister over the phone.  Unable to reach her over the phone Disposition Plan: Status is: Inpatient.  Patient is being transferred to [facility] for further management  Remains inpatient appropriate because:Ongoing diagnostic testing needed not appropriate for outpatient work up   Dispo: The patient is from: Home              Anticipated d/c is to: Home              Anticipated d/c date is: 3 days              Patient currently is not medically stable to d/c.     Consultants:     Procedures:     Antimicrobials:   Doxycycline 11/10 >   Subjective: Feels that breathing is improving. She has a productive cough. Feels that wheezing is improving.  Objective: Vitals:   03/11/20 2100 03/11/20 2134 03/12/20 0249 03/12/20 0618  BP: (!) 147/52   (!) 104/33  Pulse: 69 68  (!) 51  Resp:    12  Temp:    97.8 F (36.6 C)  TempSrc:    Oral  SpO2: 92% 94% 93% 95%  Weight:      Height:        Intake/Output Summary (Last 24 hours) at 03/12/2020 1101 Last data filed at 03/12/2020 0807 Gross per  24 hour  Intake 553.95 ml  Output 500 ml  Net 53.95 ml   Filed Weights   03/10/20 1205 03/10/20 1645  Weight: 111.6 kg 110.7 kg    Examination:  General exam: Alert, awake, oriented x 3, voice is hoarse Respiratory system: Clear to auscultation. Respiratory effort normal. Cardiovascular system:RRR. No murmurs, rubs, gallops. Gastrointestinal system: Abdomen is nondistended, soft and nontender. No organomegaly or masses felt. Normal bowel sounds  heard. Central nervous system: Alert and oriented. No focal neurological deficits. Extremities: No C/C/E, +pedal pulses Skin: No rashes, lesions or ulcers Psychiatry: Judgement and insight appear normal. Mood & affect appropriate.      Data Reviewed: I have personally reviewed following labs and imaging studies  CBC: Recent Labs  Lab 03/10/20 1227 03/12/20 0352  WBC 10.1 18.7*  NEUTROABS 8.1*  --   HGB 14.5 15.0  HCT 47.2* 48.4*  MCV 101.9* 100.6*  PLT 220 130   Basic Metabolic Panel: Recent Labs  Lab 03/10/20 1226 03/10/20 1600 03/12/20 0352  NA 137  --  140  K 4.6  --  4.0  CL 96*  --  95*  CO2 34*  --  32  GLUCOSE 104*  --  131*  BUN 16  --  33*  CREATININE 1.01*  --  1.10*  CALCIUM 8.6*  --  9.7  MG  --  2.3  --    GFR: Estimated Creatinine Clearance: 67.4 mL/min (A) (by C-G formula based on SCr of 1.1 mg/dL (H)). Liver Function Tests: Recent Labs  Lab 03/10/20 1226  AST 14*  ALT 12  ALKPHOS 79  BILITOT 0.8  PROT 7.3  ALBUMIN 3.6   No results for input(s): LIPASE, AMYLASE in the last 168 hours. No results for input(s): AMMONIA in the last 168 hours. Coagulation Profile: No results for input(s): INR, PROTIME in the last 168 hours. Cardiac Enzymes: No results for input(s): CKTOTAL, CKMB, CKMBINDEX, TROPONINI in the last 168 hours. BNP (last 3 results) No results for input(s): PROBNP in the last 8760 hours. HbA1C: No results for input(s): HGBA1C in the last 72 hours. CBG: No results for input(s): GLUCAP in the last 168 hours. Lipid Profile: No results for input(s): CHOL, HDL, LDLCALC, TRIG, CHOLHDL, LDLDIRECT in the last 72 hours. Thyroid Function Tests: No results for input(s): TSH, T4TOTAL, FREET4, T3FREE, THYROIDAB in the last 72 hours. Anemia Panel: No results for input(s): VITAMINB12, FOLATE, FERRITIN, TIBC, IRON, RETICCTPCT in the last 72 hours. Sepsis Labs: Recent Labs  Lab 03/10/20 1227 03/10/20 1405  LATICACIDVEN 0.6 0.6     Recent Results (from the past 240 hour(s))  Respiratory Panel by RT PCR (Flu A&B, Covid) - Nasopharyngeal Swab     Status: None   Collection Time: 03/10/20 12:26 PM   Specimen: Nasopharyngeal Swab  Result Value Ref Range Status   SARS Coronavirus 2 by RT PCR NEGATIVE NEGATIVE Final    Comment: (NOTE) SARS-CoV-2 target nucleic acids are NOT DETECTED.  The SARS-CoV-2 RNA is generally detectable in upper respiratoy specimens during the acute phase of infection. The lowest concentration of SARS-CoV-2 viral copies this assay can detect is 131 copies/mL. A negative result does not preclude SARS-Cov-2 infection and should not be used as the sole basis for treatment or other patient management decisions. A negative result may occur with  improper specimen collection/handling, submission of specimen other than nasopharyngeal swab, presence of viral mutation(s) within the areas targeted by this assay, and inadequate number of viral copies (<131 copies/mL). A  negative result must be combined with clinical observations, patient history, and epidemiological information. The expected result is Negative.  Fact Sheet for Patients:  PinkCheek.be  Fact Sheet for Healthcare Providers:  GravelBags.it  This test is no t yet approved or cleared by the [place] FDA and  has been authorized for detection and/or diagnosis of SARS-CoV-2 by FDA under an Emergency Use Authorization (EUA). This EUA will remain  in effect (meaning this test can be used) for the duration of the COVID-19 declaration under Section 564(b)(1) of the Act, 21 U.S.C. section 360bbb-3(b)(1), unless the authorization is terminated or revoked sooner.     Influenza A by PCR NEGATIVE NEGATIVE Final   Influenza B by PCR NEGATIVE NEGATIVE Final    Comment: (NOTE) The Xpert Xpress SARS-CoV-2/FLU/RSV assay is intended as an aid in  the diagnosis of influenza from  Nasopharyngeal swab specimens and  should not be used as a sole basis for treatment. Nasal washings and  aspirates are unacceptable for Xpert Xpress SARS-CoV-2/FLU/RSV  testing.  Fact Sheet for Patients: PinkCheek.be  Fact Sheet for Healthcare Providers: GravelBags.it  This test is not yet approved or cleared by the [place] FDA and  has been authorized for detection and/or diagnosis of SARS-CoV-2 by  FDA under an Emergency Use Authorization (EUA). This EUA will remain  in effect (meaning this test can be used) for the duration of the  Covid-19 declaration under Section 564(b)(1) of the Act, 21  U.S.C. section 360bbb-3(b)(1), unless the authorization is  terminated or revoked. Performed at [facility], [address] [place], [place] [address]   MRSA PCR Screening     Status: None   Collection Time: 03/10/20  5:08 PM   Specimen: Nasopharyngeal  Result Value Ref Range Status   MRSA by PCR NEGATIVE NEGATIVE Final    Comment:        The GeneXpert MRSA Assay (FDA approved for NASAL specimens only), is one component of a comprehensive MRSA colonization surveillance program. It is not intended to diagnose MRSA infection nor to guide or monitor treatment for MRSA infections. Performed at [facility], [address], [place], [place] [id number]          Radiology Studies: CT SOFT TISSUE NECK W CONTRAST  Addendum Date: 03/11/2020   ADDENDUM REPORT: 03/11/2020 16:36 ADDENDUM: Study discussed by telephone with Dr. Jolaine Artist Niambi Smoak on 03/11/2020 at 1631 hours. Electronically Signed   By: Genevie Ann M.D.   On: 03/11/2020 16:36   Result Date: 03/11/2020 CLINICAL DATA:  59 year old female with dysphagia. Tonsillitis suspected. EXAM: CT NECK WITH CONTRAST TECHNIQUE: Multidetector CT imaging of the neck was performed using the standard protocol following the bolus administration of intravenous contrast. CONTRAST:  83mL  OMNIPAQUE IOHEXOL 300 MG/ML  SOLN COMPARISON:  Chest CT 01/28/2019. FINDINGS: Pharynx and larynx: Bulky soft tissue mass throughout the bilateral supraglottic larynx and affecting the hypopharynx. Lobulated diffuse thickening of the epiglottis (series 2, image 53). Diffuse false cord and anterior commissure involvement with evidence of extension into the right paraglottic space on series 2 image 61 - where nodular direct extension of tumor and/or inseparable abnormal right level 3 lymph node protrudes on coronal image 58. Asymmetric erosion of the undersurface of the right thyroid cartilage on image 65. Asymmetric sclerosis of the right arytenoid on image 67., and midline extension of tumor is suspected through the anterior commissure a distance of about 11 mm as seen on series 2, image 67 and sagittal image 48. All told, tumor  size is estimated at 37 x 52 by 45 mm (AP by transverse by CC). There is evidence of airway compromise. The true cords may be spared as seen on series 2, image 71. Subglottic larynx is within normal limits. Above the vallecula pharyngeal contours appear normal. Normal superior parapharyngeal and retropharyngeal spaces. Salivary glands: Negative sublingual space. Submandibular glands and parotid glands remain within normal limits. Thyroid: Negative. Lymph nodes: Malignant left level IIIb lymph node measures 17 mm short axis and 29 mm long axis (series 2, image 55 and coronal image 73). As stated above it is possible of malignant right level IIIa lymph node is inseparable from the parent tumor along the right paraglottic space (coronal image 58). Smaller but asymmetrically enlarged left level 4 nodes measure up to 9 mm short axis (coronal image 72). No other abnormal or suspicious lymph nodes identified. Vascular: Major vascular structures in the neck and at the skull base remain patent including both internal jugular veins. The left vertebral artery appears dominant. Calcified atherosclerosis  at the skull base. Limited intracranial: Negative. Visualized orbits: Negative. Mastoids and visualized paranasal sinuses: Clear. Skeleton: Absent and carious dentition. Cervical spine degeneration. No suspicious osseous lesion identified. 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 No superior mediastinal lymphadenopathy. Visible axillary lymph nodes are normal. Mild dependent atelectasis.  No upper lung nodule identified. IMPRESSION: 1. 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 Recommend ENT consultation. Involvement of right laryngeal cartilages, with extension in the midline anterior to the strap muscles, and also extension through the right paraglottic space and/or inseparable malignant right level IIIa lymph node. Estimated tumor long axis 5.2 cm. 2. Malignant contralateral left level 3b lymph node is 2.9 cm long axis. Smaller indeterminate left level 4 lymph nodes. 3. No distant metastatic disease identified in the neck or upper chest. Electronically Signed: By: Genevie Ann M.D. On: 03/11/2020 16:23   DG Chest Portable 1 View  Result Date: 03/10/2020 CLINICAL DATA:  Shortness of breath and cough EXAM: PORTABLE CHEST 1 VIEW COMPARISON:  January 23, 2020 FINDINGS: There is bibasilar atelectatic change. Elsewhere the interstitium is mildly thickened. No consolidation. Heart size and pulmonary vascularity are normal. No adenopathy. There is degenerative change in each shoulder. IMPRESSION: Bibasilar atelectasis. Interstitial thickening likely represents a degree of underlying chronic bronchitis. No edema or airspace opacity. Heart size within normal limits. Electronically Signed   By: Lowella Grip III M.D.   On: 03/10/2020 12:42   DG Swallowing Func-Speech Pathology  Result Date: 03/11/2020 Objective Swallowing Evaluation: Type of Study: MBS-Modified Barium Swallow Study  Patient Details Name: Veronica Horton MRN: [id number] Date of Birth: Nov 17, 1960  Today's Date: 03/11/2020 Time: SLP Start Time (ACUTE ONLY): 5038 -SLP Stop Time (ACUTE ONLY): 1316 SLP Time Calculation (min) (ACUTE ONLY): 21 min Past Medical History: Past Medical History: Diagnosis Date . Bronchitis  . Class 3 obesity 01/23/2020 . COPD (chronic obstructive pulmonary disease) ([place])  . Degenerative disc disease, lumbar  . Hiatal hernia  . Hypertension  Past Surgical History: Past Surgical History: Procedure Laterality Date . CHOLECYSTECTOMY   . degenerative bone disease   . INCISIONAL HERNIA REPAIR N/A 01/15/2019  Procedure: [name] HERNIORRHAPHY WITH MESH;  Surgeon: [name], MD;  Location: [facility];  Service: General;  Laterality: N/A; . OMENTECTOMY N/A 01/15/2019  Procedure: OMENTECTOMY;  Surgeon: [name], MD;  Location: [facility];  Service: General;  Laterality: N/A; HPI: Arturo Freundlich  is a 59 y.o. female, with medical history significant of class  III obesity, hiatal hernia, hypertension, bronchitis, COPD, chronic respiratory failure on home oxygen at 5 LPM, active smoker of 1-1/2 packs of cigarettes per day , patient presents to ED secondary to shortness of breath and altered mental status, patient with receptive worsening dyspnea over the last 4 days, she is not vaccinated against Covid, she is altered at the time of my examination, history was obtained from boyfriend at bedside, and ED staff, patient still smoking, she still using 5 L of oxygen, he is with increased work of breathing, significantly dyspneic, upon presentation to ED she is altered not provide any complaints at this point. BSE requested.  Subjective: "I haven't been able to swallow my pills for a couple weeks." Assessment / Plan / Recommendation CHL IP CLINICAL IMPRESSIONS 03/11/2020 Clinical Impression Pt presents with mild pharyngeal phase dysphagia, however appearance of anatomy appears edematous (epiglottis and aryepiglottic folds). Pt with limited dentition and requires extra time to masticate solids, swallow  trigger generally at the level of the valleculae, Pt with blunted appearance of epiglottis with reduced deflection resulting in variable trace, flash penetration of thins during the swallow without aspiration and min vallecular residue with solids and brief stasis of barium tablet in valleculae. Pt with prominent cricopharyngeus. Recommend D3/mech soft and thin liquids, po medications whole in puree and follow with liquid wash or per Pt preference. Also strongly recommend additional imaging (neck CT) and ENT consult pending those results. SLP will follow pending results of imaging. Above to RN and MD.  SLP Visit Diagnosis Dysphagia, oropharyngeal phase (R13.12) Attention and concentration deficit following -- Frontal lobe and executive function deficit following -- Impact on safety and function Mild aspiration risk   CHL IP TREATMENT RECOMMENDATION 03/11/2020 Treatment Recommendations No treatment recommended at this time   Prognosis 03/11/2020 Prognosis for Safe Diet Advancement Fair Barriers to Reach Goals Severity of deficits Barriers/Prognosis Comment -- CHL IP DIET RECOMMENDATION 03/11/2020 SLP Diet Recommendations Dysphagia 3 (Mech soft) solids;Thin liquid Liquid Administration via Cup;Straw Medication Administration Whole meds with puree Compensations Slow rate;Small sips/bites Postural Changes Remain semi-upright after after feeds/meals (Comment);Seated upright at 90 degrees   CHL IP OTHER RECOMMENDATIONS 03/11/2020 Recommended Consults Consider ENT evaluation Oral Care Recommendations Oral care BID Other Recommendations Clarify dietary restrictions   CHL IP FOLLOW UP RECOMMENDATIONS 03/11/2020 Follow up Recommendations None   CHL IP FREQUENCY AND DURATION 03/11/2020 Speech Therapy Frequency (ACUTE ONLY) min 2x/week Treatment Duration 1 week      CHL IP ORAL PHASE 03/11/2020 Oral Phase WFL Oral - Pudding Teaspoon -- Oral - Pudding Cup -- Oral - Honey Teaspoon -- Oral - Honey Cup -- Oral - Nectar Teaspoon --  Oral - Nectar Cup -- Oral - Nectar Straw -- Oral - Thin Teaspoon -- Oral - Thin Cup -- Oral - Thin Straw -- Oral - Puree -- Oral - Mech Soft -- Oral - Regular -- Oral - Multi-Consistency -- Oral - Pill -- Oral Phase - Comment --  CHL IP PHARYNGEAL PHASE 03/11/2020 Pharyngeal Phase Impaired Pharyngeal- Pudding Teaspoon -- Pharyngeal -- Pharyngeal- Pudding Cup -- Pharyngeal -- Pharyngeal- Honey Teaspoon -- Pharyngeal -- Pharyngeal- Honey Cup -- Pharyngeal -- Pharyngeal- Nectar Teaspoon -- Pharyngeal -- Pharyngeal- Nectar Cup -- Pharyngeal -- Pharyngeal- Nectar Straw Pharyngeal residue - valleculae;Reduced epiglottic inversion Pharyngeal -- Pharyngeal- Thin Teaspoon Delayed swallow initiation-vallecula;Reduced epiglottic inversion Pharyngeal -- Pharyngeal- Thin Cup Reduced epiglottic inversion;Penetration/Aspiration during swallow Pharyngeal Material enters airway, remains ABOVE vocal cords then ejected out Pharyngeal- Thin Straw Reduced epiglottic inversion;Penetration/Aspiration during swallow  Pharyngeal Material enters airway, remains ABOVE vocal cords then ejected out Pharyngeal- Puree WFL Pharyngeal -- Pharyngeal- Mechanical Soft -- Pharyngeal -- Pharyngeal- Regular Pharyngeal residue - valleculae;Reduced epiglottic inversion Pharyngeal -- Pharyngeal- Multi-consistency -- Pharyngeal -- Pharyngeal- Pill Pharyngeal residue - valleculae;Reduced epiglottic inversion Pharyngeal -- Pharyngeal Comment edematous pharynx  CHL IP CERVICAL ESOPHAGEAL PHASE 03/11/2020 Cervical Esophageal Phase Impaired Pudding Teaspoon -- Pudding Cup -- Honey Teaspoon -- Honey Cup -- Nectar Teaspoon -- Nectar Cup -- Nectar Straw -- Thin Teaspoon -- Thin Cup Prominent cricopharyngeal segment Thin Straw -- Puree -- Mechanical Soft -- Regular -- Multi-consistency -- Pill -- Cervical Esophageal Comment -- Thank you, [name], [place] 03/11/2020, 2:12 PM              ECHOCARDIOGRAM COMPLETE  Result Date:  03/11/2020    ECHOCARDIOGRAM REPORT   Patient Name:   Veronica Horton Date of Exam: 03/11/2020 Medical Rec #:  [id number]       Height:       65.0 in Accession #:    [id number]      Weight:       244.0 lb Date of Birth:  January 17, 1961       BSA:          2.153 m Patient Age:    82 years        BP:           115/35 mmHg Patient Gender: F               HR:           57 bpm. Exam Location:  [facility] Procedure: 2D Echo Indications:    Dyspnea 786.09 / R06.00  History:        Patient has prior history of Echocardiogram examinations, most                 recent 01/16/2019. COPD; Risk Factors:Hypertension and Current                 Smoker. Acute respiratory failure with hypoxia.  Sonographer:    Leavy Cella RDCS (AE) Referring Phys: [id number] [address] [place] IMPRESSIONS  1. Left ventricular ejection fraction, by estimation, is 70 to 75%. The left ventricle has hyperdynamic function. The left ventricle has no regional wall motion abnormalities. There is mild left ventricular hypertrophy. Left ventricular diastolic parameters were normal.  2. Right ventricular systolic function is normal. The right ventricular size is normal. Tricuspid regurgitation signal is inadequate for assessing PA pressure.  3. The mitral valve is grossly normal. Trivial mitral valve regurgitation.  4. The aortic valve is tricuspid. Aortic valve regurgitation is not visualized.  5. The inferior vena cava is normal in size with greater than 50% respiratory variability, suggesting right atrial pressure of 3 mmHg. FINDINGS  Left Ventricle: Left ventricular ejection fraction, by estimation, is 70 to 75%. The left ventricle has hyperdynamic function. The left ventricle has no regional wall motion abnormalities. The left ventricular internal cavity size was normal in size. There is mild left ventricular hypertrophy. Left ventricular diastolic parameters were normal. Right Ventricle: The right ventricular size is normal. No increase in right ventricular wall  thickness. Right ventricular systolic function is normal. Tricuspid regurgitation signal is inadequate for assessing PA pressure. Left Atrium: Left atrial size was normal in size. Right Atrium: Right atrial size was normal in size. Pericardium: There is no evidence of pericardial effusion. Mitral Valve: The mitral valve is grossly normal. Trivial mitral valve  regurgitation. Tricuspid Valve: The tricuspid valve is grossly normal. Tricuspid valve regurgitation is trivial. Aortic Valve: The aortic valve is tricuspid. Aortic valve regurgitation is not visualized. Pulmonic Valve: The pulmonic valve was not well visualized. Pulmonic valve regurgitation is not visualized. Aorta: The aortic root is normal in size and structure. Venous: The inferior vena cava is normal in size with greater than 50% respiratory variability, suggesting right atrial pressure of 3 mmHg. IAS/Shunts: No atrial level shunt detected by color flow Doppler.  LEFT VENTRICLE PLAX 2D LVIDd:         4.05 cm  Diastology LVIDs:         2.30 cm  LV e' medial:    8.59 cm/s LV PW:         1.35 cm  LV E/e' medial:  12.2 LV IVS:        1.34 cm  LV e' lateral:   11.00 cm/s LVOT diam:     2.00 cm  LV E/e' lateral: 9.5 LVOT Area:     3.14 cm  RIGHT VENTRICLE RV S prime:     15.40 cm/s TAPSE (M-mode): 2.9 cm LEFT ATRIUM             Index       RIGHT ATRIUM           Index LA diam:        4.70 cm 2.18 cm/m  RA Area:     13.50 cm LA Vol (A2C):   73.3 ml 34.05 ml/m RA Volume:   35.40 ml  16.44 ml/m LA Vol (A4C):   39.3 ml 18.26 ml/m LA Biplane Vol: 55.6 ml 25.83 ml/m   AORTA Ao Root diam: 2.70 cm MITRAL VALVE MV Area (PHT): 2.69 cm     SHUNTS MV Decel Time: 282 msec     Systemic Diam: 2.00 cm MV E velocity: 105.00 cm/s MV A velocity: 57.80 cm/s MV E/A ratio:  1.82 Rozann Lesches MD Electronically signed by Rozann Lesches MD Signature Date/Time: 03/11/2020/4:59:31 PM    Final         Scheduled Meds: . Chlorhexidine Gluconate Cloth  6 each Topical Daily   . dextromethorphan-guaiFENesin  1 tablet Oral BID  . enoxaparin (LOVENOX) injection  55 mg Subcutaneous Q24H  . feeding supplement  237 mL Oral BID BM  . fluticasone furoate-vilanterol  1 puff Inhalation Daily  . ipratropium-albuterol  3 mL Nebulization Q6H  . mouth rinse  15 mL Mouth Rinse BID  . methylPREDNISolone (SOLU-MEDROL) injection  40 mg Intravenous Q6H  . multivitamin with minerals  1 tablet Oral Daily  . nicotine  21 mg Transdermal Daily  . pantoprazole (PROTONIX) IV  40 mg Intravenous Q24H   Continuous Infusions: . doxycycline (VIBRAMYCIN) IV 100 mg (03/12/20 0525)     LOS: 2 days    Time spent: 32mins    Kathie Dike, MD [facility]   If 7PM-7AM, please contact night-coverage [email or web address]  03/12/2020, 11:01 AM

## 2020-03-12 NOTE — Progress Notes (Signed)
Patient admitted with COPD. PT recommending HHPT, Patient only has Family plan medicaid. 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 Recommend ENT consultation. Patient will transfer to [facility].  TOC called [facility] to make them aware of new finds and change in medical status that will require great needs.  Veronica Horton will try to see patient before transfer if not she will contact [facility].

## 2020-03-12 NOTE — Progress Notes (Signed)
Patient in the care of [facility] as of 2331 Report call to [facility] [address]) completed as of 2353 Pt was awake and oriented at departure.

## 2020-03-13 ENCOUNTER — Encounter (HOSPITAL_COMMUNITY)
Admission: EM | Disposition: A | Discharge status: Home / Self Care | Payer: Self-pay | Source: Home / Self Care | Attending: Internal Medicine

## 2020-03-13 ENCOUNTER — Other Ambulatory Visit: Payer: Self-pay

## 2020-03-13 ENCOUNTER — Inpatient Hospital Stay (HOSPITAL_COMMUNITY): Payer: Medicaid Other | Admitting: Anesthesiology

## 2020-03-13 DIAGNOSIS — D751 Secondary polycythemia: Secondary | ICD-10-CM

## 2020-03-13 DIAGNOSIS — J9622 Acute and chronic respiratory failure with hypercapnia: Secondary | ICD-10-CM | POA: Diagnosis present

## 2020-03-13 DIAGNOSIS — J9621 Acute and chronic respiratory failure with hypoxia: Secondary | ICD-10-CM | POA: Diagnosis present

## 2020-03-13 HISTORY — PX: TRACHEOSTOMY TUBE PLACEMENT: SHX814

## 2020-03-13 HISTORY — PX: DIRECT LARYNGOSCOPY: SHX5326

## 2020-03-13 LAB — BASIC METABOLIC PANEL
Anion gap: 10 (ref 5–15)
BUN: 37 mg/dL — ABNORMAL HIGH (ref 6–20)
CO2: 32 mmol/L (ref 22–32)
Calcium: 9.3 mg/dL (ref 8.9–10.3)
Chloride: 98 mmol/L (ref 98–111)
Creatinine, Ser: 1.16 mg/dL — ABNORMAL HIGH (ref 0.44–1.00)
GFR, Estimated: 54 mL/min — ABNORMAL LOW (ref >60)
Glucose, Bld: 103 mg/dL — ABNORMAL HIGH (ref 70–99)
Potassium: 4.3 mmol/L (ref 3.5–5.1)
Sodium: 140 mmol/L (ref 135–145)

## 2020-03-13 LAB — CBC
HCT: 47.4 % — ABNORMAL HIGH (ref 36.0–46.0)
Hemoglobin: 15 g/dL (ref 12.0–15.0)
MCH: 30.7 pg (ref 26.0–34.0)
MCHC: 31.6 g/dL (ref 30.0–36.0)
MCV: 97.1 fL (ref 80.0–100.0)
Platelets: 208 10*3/uL (ref 150–400)
RBC: 4.88 MIL/uL (ref 3.87–5.11)
RDW: 14.5 % (ref 11.5–15.5)
WBC: 10.2 10*3/uL (ref 4.0–10.5)
nRBC: 0 % (ref 0.0–0.2)

## 2020-03-13 LAB — GLUCOSE, CAPILLARY
Glucose-Capillary: 111 mg/dL — ABNORMAL HIGH (ref 70–99)
Glucose-Capillary: 116 mg/dL — ABNORMAL HIGH (ref 70–99)

## 2020-03-13 SURGERY — LARYNGOSCOPY, DIRECT
Anesthesia: Monitor Anesthesia Care

## 2020-03-13 MED ORDER — ONDANSETRON HCL 4 MG/2ML IJ SOLN
INTRAMUSCULAR | Status: AC
  Filled 2020-03-13: qty 2

## 2020-03-13 MED ORDER — OXYMETAZOLINE HCL 0.05 % NA SOLN
NASAL | Status: AC
  Filled 2020-03-13: qty 30

## 2020-03-13 MED ORDER — PHENYLEPHRINE HCL (PRESSORS) 10 MG/ML IV SOLN
INTRAVENOUS | Status: DC | PRN
  Administered 2020-03-13: 80 ug via INTRAVENOUS

## 2020-03-13 MED ORDER — CHLORHEXIDINE GLUCONATE 0.12 % MT SOLN
15.0000 mL | Freq: Two times a day (BID) | OROMUCOSAL | Status: DC
  Administered 2020-03-13 – 2020-03-17 (×4): 15 mL via OROMUCOSAL
  Filled 2020-03-13: qty 15

## 2020-03-13 MED ORDER — ONDANSETRON HCL 4 MG/2ML IJ SOLN
INTRAMUSCULAR | Status: DC | PRN
  Administered 2020-03-13: 4 mg via INTRAVENOUS

## 2020-03-13 MED ORDER — DEXAMETHASONE SODIUM PHOSPHATE 10 MG/ML IJ SOLN
INTRAMUSCULAR | Status: DC | PRN
  Administered 2020-03-13: 10 mg via INTRAVENOUS

## 2020-03-13 MED ORDER — ORAL CARE MOUTH RINSE
15.0000 mL | Freq: Two times a day (BID) | OROMUCOSAL | Status: DC
  Administered 2020-03-14: 15 mL via OROMUCOSAL

## 2020-03-13 MED ORDER — SUGAMMADEX SODIUM 200 MG/2ML IV SOLN
INTRAVENOUS | Status: DC | PRN
  Administered 2020-03-13: 250 mg via INTRAVENOUS

## 2020-03-13 MED ORDER — 0.9 % SODIUM CHLORIDE (POUR BTL) OPTIME
TOPICAL | Status: DC | PRN
  Administered 2020-03-13: 1000 mL

## 2020-03-13 MED ORDER — LIDOCAINE 2% (20 MG/ML) 5 ML SYRINGE
INTRAMUSCULAR | Status: AC
  Filled 2020-03-13: qty 10

## 2020-03-13 MED ORDER — LACTATED RINGERS IV SOLN: INTRAVENOUS | Status: DC | PRN

## 2020-03-13 MED ORDER — PHENYLEPHRINE HCL-NACL 10-0.9 MG/250ML-% IV SOLN
INTRAVENOUS | Status: DC | PRN
  Administered 2020-03-13: 15 ug/min via INTRAVENOUS

## 2020-03-13 MED ORDER — DEXAMETHASONE SODIUM PHOSPHATE 10 MG/ML IJ SOLN
INTRAMUSCULAR | Status: AC
  Filled 2020-03-13: qty 1

## 2020-03-13 MED ORDER — ROCURONIUM BROMIDE 10 MG/ML (PF) SYRINGE
PREFILLED_SYRINGE | INTRAVENOUS | Status: DC | PRN
  Administered 2020-03-13: 30 mg via INTRAVENOUS
  Administered 2020-03-13: 20 mg via INTRAVENOUS

## 2020-03-13 MED ORDER — PROMETHAZINE HCL 25 MG/ML IJ SOLN: 6.2500 mg | INTRAMUSCULAR | Status: DC | PRN

## 2020-03-13 MED ORDER — HEMOSTATIC AGENTS (NO CHARGE) OPTIME
TOPICAL | Status: DC | PRN
  Administered 2020-03-13: 1 via TOPICAL

## 2020-03-13 MED ORDER — OXYMETAZOLINE HCL 0.05 % NA SOLN
NASAL | Status: DC | PRN
  Administered 2020-03-13: 1 via TOPICAL

## 2020-03-13 MED ORDER — LIDOCAINE-EPINEPHRINE 1 %-1:100000 IJ SOLN
INTRAMUSCULAR | Status: AC
  Filled 2020-03-13: qty 1

## 2020-03-13 MED ORDER — MIDAZOLAM HCL 2 MG/2ML IJ SOLN
INTRAMUSCULAR | Status: AC
  Filled 2020-03-13: qty 2

## 2020-03-13 MED ORDER — LIDOCAINE-EPINEPHRINE 1 %-1:100000 IJ SOLN
INTRAMUSCULAR | Status: DC | PRN
  Administered 2020-03-13: 18 mL

## 2020-03-13 MED ORDER — LACTATED RINGERS IV SOLN: INTRAVENOUS | Status: DC

## 2020-03-13 MED ORDER — MEPERIDINE HCL 25 MG/ML IJ SOLN: 6.2500 mg | INTRAMUSCULAR | Status: DC | PRN

## 2020-03-13 MED ORDER — FENTANYL CITRATE (PF) 250 MCG/5ML IJ SOLN
INTRAMUSCULAR | Status: DC | PRN
  Administered 2020-03-13: 25 ug via INTRAVENOUS
  Administered 2020-03-13 (×2): 50 ug via INTRAVENOUS
  Administered 2020-03-13: 25 ug via INTRAVENOUS
  Administered 2020-03-13: 50 ug via INTRAVENOUS
  Administered 2020-03-13 (×2): 25 ug via INTRAVENOUS

## 2020-03-13 MED ORDER — FENTANYL CITRATE (PF) 250 MCG/5ML IJ SOLN
INTRAMUSCULAR | Status: AC
  Filled 2020-03-13: qty 5

## 2020-03-13 MED ORDER — PHENYLEPHRINE 40 MCG/ML (10ML) SYRINGE FOR IV PUSH (FOR BLOOD PRESSURE SUPPORT)
PREFILLED_SYRINGE | INTRAVENOUS | Status: AC
  Filled 2020-03-13: qty 20

## 2020-03-13 MED ORDER — IPRATROPIUM-ALBUTEROL 0.5-2.5 (3) MG/3ML IN SOLN
3.0000 mL | Freq: Four times a day (QID) | RESPIRATORY_TRACT | Status: DC | PRN
  Administered 2020-03-14: 08:00:00 3 mL via RESPIRATORY_TRACT
  Filled 2020-03-13: qty 3

## 2020-03-13 MED ORDER — SUCCINYLCHOLINE CHLORIDE 200 MG/10ML IV SOSY
PREFILLED_SYRINGE | INTRAVENOUS | Status: AC
  Filled 2020-03-13: qty 10

## 2020-03-13 MED ORDER — PROPOFOL 10 MG/ML IV BOLUS
INTRAVENOUS | Status: DC | PRN
  Administered 2020-03-13 (×2): 30 mg via INTRAVENOUS
  Administered 2020-03-13: 90 mg via INTRAVENOUS
  Administered 2020-03-13: 20 mg via INTRAVENOUS

## 2020-03-13 MED ORDER — EPINEPHRINE HCL (NASAL) 0.1 % NA SOLN
NASAL | Status: AC
  Filled 2020-03-13: qty 30

## 2020-03-13 MED ORDER — FENTANYL CITRATE (PF) 100 MCG/2ML IJ SOLN: 25.0000 ug | INTRAMUSCULAR | Status: DC | PRN

## 2020-03-13 MED ORDER — MIDAZOLAM HCL 2 MG/2ML IJ SOLN
INTRAMUSCULAR | Status: DC | PRN
  Administered 2020-03-13 (×4): .5 mg via INTRAVENOUS

## 2020-03-13 SURGICAL SUPPLY — 45 items
BLADE SURG 15 STRL LF DISP TIS (BLADE) IMPLANT
BLADE SURG 15 STRL SS (BLADE) ×2
CANISTER SUCT 3000ML PPV (MISCELLANEOUS) ×2 IMPLANT
CLEANER TIP ELECTROSURG 2X2 (MISCELLANEOUS) ×2 IMPLANT
CNTNR URN SCR LID CUP LEK RST (MISCELLANEOUS) IMPLANT
CONT SPEC 4OZ STRL OR WHT (MISCELLANEOUS) ×8
CORD BIPOLAR FORCEPS 12FT (ELECTRODE) ×1 IMPLANT
COVER SURGICAL LIGHT HANDLE (MISCELLANEOUS) ×2 IMPLANT
DRAPE HALF SHEET 40X57 (DRAPES) ×2 IMPLANT
DRSG TELFA 3X8 NADH (GAUZE/BANDAGES/DRESSINGS) ×2 IMPLANT
ELECT COATED BLADE 2.86 ST (ELECTRODE) ×2 IMPLANT
ELECT REM PT RETURN 9FT ADLT (ELECTROSURGICAL) ×2
ELECTRODE REM PT RTRN 9FT ADLT (ELECTROSURGICAL) ×1 IMPLANT
FORCEPS BIPOLAR SPETZLER 8 1.0 (NEUROSURGERY SUPPLIES) ×1 IMPLANT
GLOVE BIO SURGEON STRL SZ 6.5 (GLOVE) ×2 IMPLANT
GOWN STRL REUS W/ TWL LRG LVL3 (GOWN DISPOSABLE) ×1 IMPLANT
GOWN STRL REUS W/TWL 2XL LVL3 (GOWN DISPOSABLE) ×1 IMPLANT
GOWN STRL REUS W/TWL LRG LVL3 (GOWN DISPOSABLE) ×4
HEMOSTAT ARISTA ABSORB 3G PWDR (HEMOSTASIS) ×1 IMPLANT
HOLDER TRACH TUBE VELCRO 19.5 (MISCELLANEOUS) ×1 IMPLANT
KIT BASIN OR (CUSTOM PROCEDURE TRAY) ×2 IMPLANT
KIT SUCTION CATH 14FR (SUCTIONS) ×1 IMPLANT
KIT TURNOVER KIT B (KITS) ×2 IMPLANT
NDL HYPO 25GX1X1/2 BEV (NEEDLE) ×1 IMPLANT
NEEDLE HYPO 25GX1X1/2 BEV (NEEDLE) ×2 IMPLANT
NS IRRIG 1000ML POUR BTL (IV SOLUTION) ×2 IMPLANT
PACK EENT II TURBAN DRAPE (CUSTOM PROCEDURE TRAY) ×2 IMPLANT
PAD ARMBOARD 7.5X6 YLW CONV (MISCELLANEOUS) ×4 IMPLANT
PAD DRESSING TELFA 3X8 NADH (GAUZE/BANDAGES/DRESSINGS) IMPLANT
PATTIES SURGICAL .5 X3 (DISPOSABLE) ×1 IMPLANT
PENCIL SMOKE EVACUATOR (MISCELLANEOUS) ×2 IMPLANT
POSITIONER HEAD DONUT 9IN (MISCELLANEOUS) ×1 IMPLANT
SOL ANTI FOG 6CC (MISCELLANEOUS) IMPLANT
SOLUTION ANTI FOG 6CC (MISCELLANEOUS) ×1
SPONGE INTESTINAL PEANUT (DISPOSABLE) ×3 IMPLANT
SUT ETHILON 2 0 FS 18 (SUTURE) ×4 IMPLANT
SUT VIC AB 3-0 SH 27 (SUTURE) ×2
SUT VIC AB 3-0 SH 27X BRD (SUTURE) IMPLANT
SYR 20ML LL LF (SYRINGE) ×2 IMPLANT
SYR CONTROL 10ML LL (SYRINGE) ×2 IMPLANT
TOWEL GREEN STERILE FF (TOWEL DISPOSABLE) ×4 IMPLANT
TUBE CONNECTING 12X1/4 (SUCTIONS) ×2 IMPLANT
TUBE TRACH  6.0 CUFF FLEX (MISCELLANEOUS) ×2
TUBE TRACH 6.0 CUFF FLEX (MISCELLANEOUS) IMPLANT
WATER STERILE IRR 1000ML POUR (IV SOLUTION) ×2 IMPLANT

## 2020-03-13 NOTE — Progress Notes (Signed)
Admitted as a transfer- for respiratory failure- felt contineuos pulse is appropriate- npo at this time- inquired about pt needing iv fluids

## 2020-03-13 NOTE — Progress Notes (Signed)
PT Cancellation Note  Patient Details Name: Veronica Horton MRN: [id number] DOB: 1960-11-12   Cancelled Treatment: Therapy attempted to see patient in the morning. Nursing advised PT to hold in the morning 2nd to respiratory issues. Therapy unable to follow up in the afternoon.         Carney Living PT DPT  03/13/2020, 1:48 PM

## 2020-03-13 NOTE — Interval H&P Note (Signed)
History and Physical Interval Note:  03/13/2020 2:07 PM  Veronica Horton  has presented today for surgery, with the diagnosis of laryngeal mass.  The various methods of treatment have been discussed with the patient . After consideration of risks, benefits and other options for treatment, the patient has consented to  Procedure(s): DIRECT LARYNGOSCOPY WITH BIOPSY (N/A) AWAKE TRACHEOSTOMY (N/A) as a surgical intervention.  The patient's history has been reviewed, patient examined, no change in status, stable for surgery.  I have reviewed the patient's chart and labs.  Questions were answered to the patient's satisfaction.     Zooey Schreurs A Eydan Chianese

## 2020-03-13 NOTE — Transfer of Care (Signed)
Immediate Anesthesia Transfer of Care Note  Patient: Veronica Horton  Procedure(s) Performed: DIRECT LARYNGOSCOPY WITH BIOPSY (N/A ) AWAKE TRACHEOSTOMY (N/A )  Patient Location: PACU  Anesthesia Type:General  Level of Consciousness: awake, alert  and oriented  Airway & Oxygen Therapy: Patient Spontanous Breathing and Patient connected to tracheostomy mask oxygen  Post-op Assessment: Report given to RN and Post -op Vital signs reviewed and stable  Post vital signs: Reviewed and stable  Last Vitals:  Vitals Value Taken Time  BP 170/78 03/13/20 1625  Temp 36.4 C 03/13/20 1623  Pulse 116 03/13/20 1632  Resp 16 03/13/20 1632  SpO2 87 % 03/13/20 1632  Vitals shown include unvalidated device data.  Last Pain:  Vitals:   03/13/20 1623  TempSrc:   PainSc: 0-No pain         Complications: No complications documented.

## 2020-03-13 NOTE — Op Note (Signed)
OPERATIVE NOTE  Veronica Horton Date/Time of Admission: 03/10/2020 11:52 AM  CSN: [id number];YKZ:[id number] Attending Provider: Desiree Hane, MD Room/Bed: MCPO/NONE DOB: Mar 09, 1961 Age: 59 y.o.   Pre-Op Diagnosis: Laryngeal Mass  Post-Op Diagnosis: Laryngeal Mass  Procedure: Procedure(s): DIRECT LARYNGOSCOPY WITH BIOPSY AWAKE TRACHEOSTOMY  Anesthesia: General  Surgeon(s): [place], DO  Staff: Circulator: Tereasa Coop, RN Scrub Person: Mauricia Area; Clovis Fredrickson Circulator Assistant: Natalia Leatherwood, RN  Implants: * No implants in log *  Specimens: ID Type Source Tests Collected by Time Destination  1 : Central Base of Tongue  ENT PATH Other SURGICAL PATHOLOGY Caoilainn Sacks, [place], DO 03/13/2020 1527   2 : Lingual Surface of Epiglottis ENT PATH Other SURGICAL PATHOLOGY Haygen Zebrowski A, DO 03/13/2020 1530   3 : Lingual Surface of Epiglottis  ENT PATH Other SURGICAL PATHOLOGY Aris Even A, DO 03/13/2020 1532   4 : Right False Vocal Cord  ENT Vocal Cord, Right SURGICAL PATHOLOGY Locke Barrell A, DO 93/90/3009 2330     Complications: None    EBL: 15 ML  Condition: Stable  Brief History: The patient is a 59 y.o. female with a history of COPD, supraglottic mass lesion on CT neck.  Flexible laryngoscopy performed at the bedside demonstrated an extensive mass lesion completely obstructing visualization of the patient's true vocal folds. Given the patient's history and findings I recommended awake tracheostomy with direct laryngoscopy and biopsy under local anesthesia, risks and benefits were discussed in detail with the patient, who agreed to proceed with above-stated procedure.  Operative Findings:  Large supraglottic mass, involving lingual and laryngeal surface of epiglottis, with apparent involvement of the right arytenoid, right false vocal fold.  Bilateral true vocal folds did not have gross tumor involvement.  Postcricoid area  uninvolved. 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  Additional tissue sent as permanent specimen.   Clinically staged T4a N2c MX   Description of Operation: Once operative consent was obtained and the site and surgery were confirmed with the patient and the operating room team, the patient was brought back to the operating room spontaneously breathing. Once the patient was lightly sedated, the patient was turned over to the ENT service. The patient was prepped and draped in clean fashion. A 2 cm incision was planned horizontally in the neck 2 cm above the sternal notch in the midline. The incision was injected with 10 ml of 1% lidocaine with 1:100,000 epinephrine A #15 blade scalpel was then used to incise the skin and subcutaneous tissue along the planned incision. The subcutaneous tissue and strap musculature were divided through the midline raphae with Bovie electrocautery. The thyroid isthmus was identified and also divided with Bovie electrocautery.  Additional 7cc of 1% lidocaine with 1:100,000 epinephrine was utilized to maintain patient comfort. Education officer, museum were used to retract the soft tissues and then the patient's anterior trachea was identified and cleaned with a Kitner. The second tracheal ring was identified.  Meticulous hemostasis was achieved using bipolar cautery.  A 15 blade scalpel was used to incise the space between the first and second trachel ring and the airway was suctioned free of any mucus and blood. A Bjrk flap was then created using a heavy scissor and sutured to the subcutaneous tissue using a  3-0 vicryl suture.  A 6-0 Shiley tracheostomy tube was inserted without difficulty and the patient's airway was stabilized.  The anesthesia circuit was then  connected and good gas exchange was confirmed.  Once good gas exchange was confirmed, general anesthesia was induced. The  tracheostomy tube was sutured in place with 2-0 Silk sutures and a trach tie. Once again, the surgical site was inspected for any active bleeding, and hemostasis was confirmed.    Attention was then turned to the direct laryngoscopy portion of the procedure.  The patient's bed was rotated 90 degrees. A laryngoscope was used to examine the patient's oral cavity, oropharynx and larynx with findings and frozen section biopsy results as documented above.  Moderate bleeding was noted from the tumor, which was controlled with Afrin-soaked pledgets and Arista.   An orogastric tube was passed and stomach contents were aspirated. Patient was aroused from anesthetic and transferred from the operating room to the recovery room in stable condition. There were no complications.   Jason Coop, [facility]  03/13/2020

## 2020-03-13 NOTE — Progress Notes (Addendum)
OTO HNS POST-OP NOTE    Veronica Horton is a 59 y/o F with history of COPD,chronic respiratory failure on home oxygen at 5 L/min, with large laryngeal mass lesion, now POD #0 s/p tracheostomy with Shiley 6-0 and direct laryngoscopy and biopsy. 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 Patient is clinically staged [id number].   Plan: - Admit to ICU for post-op observation. Please note patient is NOT intubatable from above, please keep sign at head of bed stating this.  - Tape obturator to head of bed - Keep extra 6-0 and 4-0 shiley trach at nursing station - Wean vent as tolerated - Routine trach care - OK to deflate trach after 24 hours if not requiring vent support - Continue speech and swallowing therapy once cuff deflated - Do not cut trach sutures until POD #5 - Follow up final pathology results - Blood-tinged sputum to be expected in postoperative state, moderate bleeding from tumor was noted intraoperatively; patient on Lovenox for DVT prophylaxis - Recommend oncology, radiation oncology consultation - Patient will need PET/CT for staging, will defer timing of imaging to Oncology  - Recommend dental evaluation for extraction of remaining dentition - Recommend smoking cessation counseling - Recommend CorPak placement to provide nutrition, patient will likely require consideration for PEG tube placement pending treatment course - Medical management as per primary team  Thank you for allowing me to participate in the care of this patient. Please do not hesitate to contact me with any questions or concerns.   Jason Coop, [facility] Cell: [phone]

## 2020-03-13 NOTE — Progress Notes (Signed)
Surgeon aware of lovenox yesterday.

## 2020-03-13 NOTE — Anesthesia Postprocedure Evaluation (Signed)
Anesthesia Post Note  Patient: Veronica Horton  Procedure(s) Performed: DIRECT LARYNGOSCOPY WITH BIOPSY (N/A ) AWAKE TRACHEOSTOMY (N/A )     Patient location during evaluation: PACU Anesthesia Type: MAC and General Level of consciousness: sedated and patient cooperative Pain management: pain level controlled Vital Signs Assessment: post-procedure vital signs reviewed and stable Respiratory status: spontaneous breathing Cardiovascular status: stable Anesthetic complications: no   No complications documented.  Last Vitals:  Vitals:   03/13/20 2000 03/13/20 2100  BP: (!) 143/74 133/69  Pulse: (!) 54 (!) 55  Resp: 15 14  Temp:    SpO2: 90% 90%    Last Pain:  Vitals:   03/13/20 2000  TempSrc:   PainSc: 0-No pain                 Nolon Nations

## 2020-03-13 NOTE — H&P (View-Only) (Signed)
ENT CONSULT:  Reason for Consult: Supraglottic Mass  Referring Physician:  Dr. Lonny Prude  HPI: Veronica Horton is an 59 y.o. female with past medical history of obesity, hiatal hernia, hypertension, bronchitis, COPD, chronic respiratory failure on home oxygen at 5 L/min, who presents as transfer from [facility].  Patient initially presented to [facility] on 03/10/2020 with complaints of shortness of breath and altered mental status.  Patient was admitted with acute exacerbation of COPD and placed on BiPAP.  Patient was evaluated by speech-language pathology.  Modified barium swallow study was performed on 11/11 which demonstrated edematous supraglottis and mild pharyngeal phase dysphagia; SLP recommended CT neck and ENT consultation.  CT of the neck was performed which demonstrated a bulky soft tissue mass in the supraglottis with narrowing of the airway, involvement of the right laryngeal cartilage with extension to the strap muscles and the right paraglottic space, questionable bilateral cervical adenopathy concerning for regional metastatic disease.  ENT was consulted and evaluated transfer to [facility].  On examination today, patient is sitting up in a chair.  She reports that her shortness of breath is significantly improved from initial presentation; she is currently on 5 L nasal cannula.  She states symptoms of dysphagia have been present for approximately 3 weeks prior to her presentation at [facility].  She denies any unintentional weight loss.  She states she was a current pack-a-day smoker up until her hospitalization, with a roughly 35-pack-year history.    Past Medical History:  Diagnosis Date  . Bronchitis   . Class 3 obesity 01/23/2020  . COPD (chronic obstructive pulmonary disease) ([place])   . Degenerative disc disease, lumbar   . Hiatal hernia   . Hypertension     Past Surgical History:  Procedure Laterality Date  . CHOLECYSTECTOMY    . degenerative bone disease     . INCISIONAL HERNIA REPAIR N/A 01/15/2019   Procedure: [name] HERNIORRHAPHY WITH MESH;  Surgeon: [name], MD;  Location: [facility];  Service: General;  Laterality: N/A;  . OMENTECTOMY N/A 01/15/2019   Procedure: OMENTECTOMY;  Surgeon: [name], MD;  Location: [facility];  Service: General;  Laterality: N/A;    Family History  Problem Relation Age of Onset  . Hypertension Mother   . Sudden Cardiac Death Neg Hx     Social History:  reports that she has been smoking. She has been smoking about 1.00 pack per day. She has never used smokeless tobacco. She reports that she does not drink alcohol and does not use drugs.  Allergies:  Allergies  Allergen Reactions  . Codeine Hives  . Penicillins Hives    Did it involve swelling of the face/tongue/throat, SOB, or low BP? No Did it involve sudden or severe rash/hives, skin peeling, or any reaction on the inside of your mouth or nose? Yes Did you need to seek medical attention at a hospital or doctor's office? Unknown When did it last happen?Over 10 years If all above answers are "NO", may proceed with cephalosporin use.     Medications: I have reviewed the patient's current medications.  Results for orders placed or performed during the hospital encounter of 03/10/20 (from the past 48 hour(s))  Basic metabolic panel     Status: Abnormal   Collection Time: 03/12/20  3:52 AM  Result Value Ref Range   Sodium 140 135 - 145 mmol/L   Potassium 4.0 3.5 - 5.1 mmol/L   Chloride 95 (L) 98 - 111 mmol/L  CO2 32 22 - 32 mmol/L   Glucose, Bld 131 (H) 70 - 99 mg/dL    Comment: Glucose reference range applies only to samples taken after fasting for at least 8 hours.   BUN 33 (H) 6 - 20 mg/dL   Creatinine, Ser 1.10 (H) 0.44 - 1.00 mg/dL   Calcium 9.7 8.9 - 10.3 mg/dL   GFR, Estimated 58 (L) >60 mL/min    Comment: (NOTE) Calculated using the CKD-EPI Creatinine Equation (2021)    Anion gap 13 5 - 15    Comment: Performed at [facility], [address] [place], [place] [address]  CBC     Status: Abnormal   Collection Time: 03/12/20  3:52 AM  Result Value Ref Range   WBC 18.7 (H) 4.0 - 10.5 K/uL   RBC 4.81 3.87 - 5.11 MIL/uL   Hemoglobin 15.0 12.0 - 15.0 g/dL   HCT 48.4 (H) 36 - 46 %   MCV 100.6 (H) 80.0 - 100.0 fL   MCH 31.2 26.0 - 34.0 pg   MCHC 31.0 30.0 - 36.0 g/dL   RDW 14.6 11.5 - 15.5 %   Platelets 248 150 - 400 K/uL   nRBC 0.0 0.0 - 0.2 %    Comment: Performed at [facility], [address] [place], [place] [address]  CBC     Status: Abnormal   Collection Time: 03/13/20  8:49 AM  Result Value Ref Range   WBC 10.2 4.0 - 10.5 K/uL   RBC 4.88 3.87 - 5.11 MIL/uL   Hemoglobin 15.0 12.0 - 15.0 g/dL   HCT 47.4 (H) 36 - 46 %   MCV 97.1 80.0 - 100.0 fL   MCH 30.7 26.0 - 34.0 pg   MCHC 31.6 30.0 - 36.0 g/dL   RDW 14.5 11.5 - 15.5 %   Platelets 208 150 - 400 K/uL   nRBC 0.0 0.0 - 0.2 %    Comment: Performed at [facility], [place] [address], [place], [place] [address]  Basic metabolic panel     Status: Abnormal   Collection Time: 03/13/20  8:49 AM  Result Value Ref Range   Sodium 140 135 - 145 mmol/L   Potassium 4.3 3.5 - 5.1 mmol/L   Chloride 98 98 - 111 mmol/L   CO2 32 22 - 32 mmol/L   Glucose, Bld 103 (H) 70 - 99 mg/dL    Comment: Glucose reference range applies only to samples taken after fasting for at least 8 hours.   BUN 37 (H) 6 - 20 mg/dL   Creatinine, Ser 1.16 (H) 0.44 - 1.00 mg/dL   Calcium 9.3 8.9 - 10.3 mg/dL   GFR, Estimated 54 (L) >60 mL/min    Comment: (NOTE) Calculated using the CKD-EPI Creatinine Equation (2021)    Anion gap 10 5 - 15    Comment: Performed at [facility] [address], [place], [place] [address]    CT SOFT TISSUE NECK W CONTRAST  Addendum Date: 03/11/2020   ADDENDUM REPORT: 03/11/2020 16:36 ADDENDUM: Study discussed by telephone with Dr. Jolaine Artist MEMON on 03/11/2020 at 1631 hours. Electronically Signed   By: Genevie Ann M.D.   On: 03/11/2020 16:36    Result Date: 03/11/2020 CLINICAL DATA:  59 year old female with dysphagia. Tonsillitis suspected. EXAM: CT NECK WITH CONTRAST TECHNIQUE: Multidetector CT imaging of the neck was performed using the standard protocol following the bolus administration of intravenous contrast. CONTRAST:  98mL OMNIPAQUE IOHEXOL 300 MG/ML  SOLN COMPARISON:  Chest CT 01/28/2019. FINDINGS: Pharynx and larynx: Bulky soft tissue mass throughout the bilateral supraglottic larynx and affecting the hypopharynx. Lobulated diffuse thickening of the epiglottis (series 2, image 53). Diffuse false cord and anterior commissure involvement with evidence of extension into the right paraglottic space on series 2 image 61 - where nodular direct extension of tumor and/or inseparable abnormal right level 3 lymph node protrudes on coronal image 58. Asymmetric erosion of the undersurface of the right thyroid cartilage on image 65. Asymmetric sclerosis of the right arytenoid on image 67., and midline extension of tumor is suspected through the anterior commissure a distance of about 11 mm as seen on series 2, image 67 and sagittal image 48. All told, tumor size is estimated at 37 x 52 by 45 mm (AP by transverse by CC). There is evidence of airway compromise. The true cords may be spared as seen on series 2, image 71. Subglottic larynx is within normal limits. Above the vallecula pharyngeal contours appear normal. Normal superior parapharyngeal and retropharyngeal spaces. Salivary glands: Negative sublingual space. Submandibular glands and parotid glands remain within normal limits. Thyroid: Negative. Lymph nodes: Malignant left level IIIb lymph node measures 17 mm short axis and 29 mm long axis (series 2, image 55 and coronal image 73). As stated above it is possible of malignant right level IIIa lymph node is inseparable from the parent tumor along the right paraglottic space (coronal image 58). Smaller but asymmetrically enlarged left level 4 nodes  measure up to 9 mm short axis (coronal image 72). No other abnormal or suspicious lymph nodes identified. Vascular: Major vascular structures in the neck and at the skull base remain patent including both internal jugular veins. The left vertebral artery appears dominant. Calcified atherosclerosis at the skull base. Limited intracranial: Negative. Visualized orbits: Negative. Mastoids and visualized paranasal sinuses: Clear. Skeleton: Absent and carious dentition. Cervical spine degeneration. No suspicious osseous lesion identified. 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 No superior mediastinal lymphadenopathy. Visible axillary lymph nodes are normal. Mild dependent atelectasis.  No upper lung nodule identified. IMPRESSION: 1. 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 Recommend ENT consultation. Involvement of right laryngeal cartilages, with extension in the midline anterior to the strap muscles, and also extension through the right paraglottic space and/or inseparable malignant right level IIIa lymph node. Estimated tumor long axis 5.2 cm. 2. Malignant contralateral left level 3b lymph node is 2.9 cm long axis. Smaller indeterminate left level 4 lymph nodes. 3. No distant metastatic disease identified in the neck or upper chest. Electronically Signed: By: Genevie Ann M.D. On: 03/11/2020 16:23   DG Swallowing Func-Speech Pathology  Result Date: 03/11/2020 Objective Swallowing Evaluation: Type of Study: MBS-Modified Barium Swallow Study  Patient Details Name: AKEISHA LAGERQUIST MRN: [id number] Date of Birth: 29-Jun-1960 Today's Date: 03/11/2020 Time: SLP Start Time (ACUTE ONLY): 8366 -SLP Stop Time (ACUTE ONLY): 1316 SLP Time Calculation (min) (ACUTE ONLY): 21 min Past Medical History: Past Medical History: Diagnosis Date . Bronchitis  . Class 3 obesity 01/23/2020 . COPD (chronic obstructive pulmonary disease) ([place])  . Degenerative disc disease, lumbar  . Hiatal  hernia  . Hypertension  Past Surgical History: Past Surgical History: Procedure Laterality Date . CHOLECYSTECTOMY   . degenerative bone disease   . INCISIONAL HERNIA REPAIR N/A 01/15/2019  Procedure: [name] HERNIORRHAPHY WITH MESH;  Surgeon: [name], MD;  Location: [facility];  Service: General;  Laterality: N/A; . OMENTECTOMY N/A 01/15/2019  Procedure: OMENTECTOMY;  Surgeon: Arnoldo Morale,  Elta Guadeloupe, MD;  Location: [facility];  Service: General;  Laterality: N/A; HPI: Ruthellen Tippy  is a 59 y.o. female, with medical history significant of class III obesity, hiatal hernia, hypertension, bronchitis, COPD, chronic respiratory failure on home oxygen at 5 LPM, active smoker of 1-1/2 packs of cigarettes per day , patient presents to ED secondary to shortness of breath and altered mental status, patient with receptive worsening dyspnea over the last 4 days, she is not vaccinated against Covid, she is altered at the time of my examination, history was obtained from boyfriend at bedside, and ED staff, patient still smoking, she still using 5 L of oxygen, he is with increased work of breathing, significantly dyspneic, upon presentation to ED she is altered not provide any complaints at this point. BSE requested.  Subjective: "I haven't been able to swallow my pills for a couple weeks." Assessment / Plan / Recommendation CHL IP CLINICAL IMPRESSIONS 03/11/2020 Clinical Impression Pt presents with mild pharyngeal phase dysphagia, however appearance of anatomy appears edematous (epiglottis and aryepiglottic folds). Pt with limited dentition and requires extra time to masticate solids, swallow trigger generally at the level of the valleculae, Pt with blunted appearance of epiglottis with reduced deflection resulting in variable trace, flash penetration of thins during the swallow without aspiration and min vallecular residue with solids and brief stasis of barium tablet in valleculae. Pt with prominent cricopharyngeus. Recommend D3/mech  soft and thin liquids, po medications whole in puree and follow with liquid wash or per Pt preference. Also strongly recommend additional imaging (neck CT) and ENT consult pending those results. SLP will follow pending results of imaging. Above to RN and MD.  SLP Visit Diagnosis Dysphagia, oropharyngeal phase (R13.12) Attention and concentration deficit following -- Frontal lobe and executive function deficit following -- Impact on safety and function Mild aspiration risk   CHL IP TREATMENT RECOMMENDATION 03/11/2020 Treatment Recommendations No treatment recommended at this time   Prognosis 03/11/2020 Prognosis for Safe Diet Advancement Fair Barriers to Reach Goals Severity of deficits Barriers/Prognosis Comment -- CHL IP DIET RECOMMENDATION 03/11/2020 SLP Diet Recommendations Dysphagia 3 (Mech soft) solids;Thin liquid Liquid Administration via Cup;Straw Medication Administration Whole meds with puree Compensations Slow rate;Small sips/bites Postural Changes Remain semi-upright after after feeds/meals (Comment);Seated upright at 90 degrees   CHL IP OTHER RECOMMENDATIONS 03/11/2020 Recommended Consults Consider ENT evaluation Oral Care Recommendations Oral care BID Other Recommendations Clarify dietary restrictions   CHL IP FOLLOW UP RECOMMENDATIONS 03/11/2020 Follow up Recommendations None   CHL IP FREQUENCY AND DURATION 03/11/2020 Speech Therapy Frequency (ACUTE ONLY) min 2x/week Treatment Duration 1 week      CHL IP ORAL PHASE 03/11/2020 Oral Phase WFL Oral - Pudding Teaspoon -- Oral - Pudding Cup -- Oral - Honey Teaspoon -- Oral - Honey Cup -- Oral - Nectar Teaspoon -- Oral - Nectar Cup -- Oral - Nectar Straw -- Oral - Thin Teaspoon -- Oral - Thin Cup -- Oral - Thin Straw -- Oral - Puree -- Oral - Mech Soft -- Oral - Regular -- Oral - Multi-Consistency -- Oral - Pill -- Oral Phase - Comment --  CHL IP PHARYNGEAL PHASE 03/11/2020 Pharyngeal Phase Impaired Pharyngeal- Pudding Teaspoon -- Pharyngeal -- Pharyngeal-  Pudding Cup -- Pharyngeal -- Pharyngeal- Honey Teaspoon -- Pharyngeal -- Pharyngeal- Honey Cup -- Pharyngeal -- Pharyngeal- Nectar Teaspoon -- Pharyngeal -- Pharyngeal- Nectar Cup -- Pharyngeal -- Pharyngeal- Nectar Straw Pharyngeal residue - valleculae;Reduced epiglottic inversion Pharyngeal -- Pharyngeal- Thin Teaspoon Delayed swallow initiation-vallecula;Reduced epiglottic inversion Pharyngeal --  Pharyngeal- Thin Cup Reduced epiglottic inversion;Penetration/Aspiration during swallow Pharyngeal Material enters airway, remains ABOVE vocal cords then ejected out Pharyngeal- Thin Straw Reduced epiglottic inversion;Penetration/Aspiration during swallow Pharyngeal Material enters airway, remains ABOVE vocal cords then ejected out Pharyngeal- Puree WFL Pharyngeal -- Pharyngeal- Mechanical Soft -- Pharyngeal -- Pharyngeal- Regular Pharyngeal residue - valleculae;Reduced epiglottic inversion Pharyngeal -- Pharyngeal- Multi-consistency -- Pharyngeal -- Pharyngeal- Pill Pharyngeal residue - valleculae;Reduced epiglottic inversion Pharyngeal -- Pharyngeal Comment edematous pharynx  CHL IP CERVICAL ESOPHAGEAL PHASE 03/11/2020 Cervical Esophageal Phase Impaired Pudding Teaspoon -- Pudding Cup -- Honey Teaspoon -- Honey Cup -- Nectar Teaspoon -- Nectar Cup -- Nectar Straw -- Thin Teaspoon -- Thin Cup Prominent cricopharyngeal segment Thin Straw -- Puree -- Mechanical Soft -- Regular -- Multi-consistency -- Pill -- Cervical Esophageal Comment -- Thank you, [name], [place] 03/11/2020, 2:12 PM              ECHOCARDIOGRAM COMPLETE  Result Date: 03/11/2020    ECHOCARDIOGRAM REPORT   Patient Name:   ANNMARIE PLEMMONS Date of Exam: 03/11/2020 Medical Rec #:  [id number]       Height:       65.0 in Accession #:    [id number]      Weight:       244.0 lb Date of Birth:  1961-01-13       BSA:          2.153 m Patient Age:    43 years        BP:           115/35 mmHg Patient Gender: F               HR:            57 bpm. Exam Location:  [facility] Procedure: 2D Echo Indications:    Dyspnea 786.09 / R06.00  History:        Patient has prior history of Echocardiogram examinations, most                 recent 01/16/2019. COPD; Risk Factors:Hypertension and Current                 Smoker. Acute respiratory failure with hypoxia.  Sonographer:    Leavy Cella RDCS (AE) Referring Phys: [id number] [address] [place] IMPRESSIONS  1. Left ventricular ejection fraction, by estimation, is 70 to 75%. The left ventricle has hyperdynamic function. The left ventricle has no regional wall motion abnormalities. There is mild left ventricular hypertrophy. Left ventricular diastolic parameters were normal.  2. Right ventricular systolic function is normal. The right ventricular size is normal. Tricuspid regurgitation signal is inadequate for assessing PA pressure.  3. The mitral valve is grossly normal. Trivial mitral valve regurgitation.  4. The aortic valve is tricuspid. Aortic valve regurgitation is not visualized.  5. The inferior vena cava is normal in size with greater than 50% respiratory variability, suggesting right atrial pressure of 3 mmHg. FINDINGS  Left Ventricle: Left ventricular ejection fraction, by estimation, is 70 to 75%. The left ventricle has hyperdynamic function. The left ventricle has no regional wall motion abnormalities. The left ventricular internal cavity size was normal in size. There is mild left ventricular hypertrophy. Left ventricular diastolic parameters were normal. Right Ventricle: The right ventricular size is normal. No increase in right ventricular wall thickness. Right ventricular systolic function is normal. Tricuspid regurgitation signal is inadequate for assessing PA pressure. Left Atrium: Left atrial size was normal in size. Right Atrium:  Right atrial size was normal in size. Pericardium: There is no evidence of pericardial effusion. Mitral Valve: The mitral valve is grossly normal. Trivial mitral  valve regurgitation. Tricuspid Valve: The tricuspid valve is grossly normal. Tricuspid valve regurgitation is trivial. Aortic Valve: The aortic valve is tricuspid. Aortic valve regurgitation is not visualized. Pulmonic Valve: The pulmonic valve was not well visualized. Pulmonic valve regurgitation is not visualized. Aorta: The aortic root is normal in size and structure. Venous: The inferior vena cava is normal in size with greater than 50% respiratory variability, suggesting right atrial pressure of 3 mmHg. IAS/Shunts: No atrial level shunt detected by color flow Doppler.  LEFT VENTRICLE PLAX 2D LVIDd:         4.05 cm  Diastology LVIDs:         2.30 cm  LV e' medial:    8.59 cm/s LV PW:         1.35 cm  LV E/e' medial:  12.2 LV IVS:        1.34 cm  LV e' lateral:   11.00 cm/s LVOT diam:     2.00 cm  LV E/e' lateral: 9.5 LVOT Area:     3.14 cm  RIGHT VENTRICLE RV S prime:     15.40 cm/s TAPSE (M-mode): 2.9 cm LEFT ATRIUM             Index       RIGHT ATRIUM           Index LA diam:        4.70 cm 2.18 cm/m  RA Area:     13.50 cm LA Vol (A2C):   73.3 ml 34.05 ml/m RA Volume:   35.40 ml  16.44 ml/m LA Vol (A4C):   39.3 ml 18.26 ml/m LA Biplane Vol: 55.6 ml 25.83 ml/m   AORTA Ao Root diam: 2.70 cm MITRAL VALVE MV Area (PHT): 2.69 cm     SHUNTS MV Decel Time: 282 msec     Systemic Diam: 2.00 cm MV E velocity: 105.00 cm/s MV A velocity: 57.80 cm/s MV E/A ratio:  1.82 Rozann Lesches MD Electronically signed by Rozann Lesches MD Signature Date/Time: 03/11/2020/4:59:31 PM    Final     WIO:XBDZHG of Systems  Respiratory: Positive for cough and shortness of breath.     Blood pressure 136/64, pulse (!) 55, temperature 97.7 F (36.5 C), temperature source Oral, resp. rate 16, height 5\' 5"  (1.651 m), weight 110.7 kg, SpO2 99 %.   CONSTITUTIONAL: Obese female, sitting up in char, appears comfortable, in no no distress. Alert and oriented x 3 PULMONARY/CHEST WALL: On 5 L [place] with no increased work of  breathing, no stridor HENT: Head : normocephalic and atraumatic Nose: Mild crusting noted bilaterally of caudal septum at contact points of nasal cannula, no active bleeding appreciated Mouth/Throat: Very poor dentition with several broken and missing teeth, remaining dentition with active decay.  Floor mouth soft.  No evidence of mucosal lesions, uvula midline.  Throat: oropharynx clear and moist. 1+ tonsils Mucous membranes: normal EYES: conjunctiva normal, EOM normal and PERRL NECK: Bulky mass lesion noted at level of thyroid cartilage on right, questionable level II lymph node versus tumor extension, overlying skin normal, palpable tracheal landmarks. Left level III lymph node measuring approximately 2.5cm, mobile.   Studies Reviewed:CT neck reviewed  Flexible Fiberoptic Nasolaryngoscopy: Anesth: Topical with 4% lidocaine Compl: None Findings: Bulky exophytic mass lesion of the supraglottis with complete encasement of the epiglottis; unable to visualize true  vocal folds due to extent of mass lesion, unable to visualize pyriform sinuses bilaterally.   Description:  After discussing risks, benefits, and alternatives, the patient was placed in a seated position and the left nasal passage was sprayed with topical anesthetic.  The fiberoptic scope was passed through the left nasal passage to view the pharynx and larynx.  Findings are noted above.  The scope was then removed and she was returned to nursing care in stable condition.   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  -Patient presently stable on 5 L oxygen via nasal cannula.  -Patient will need direct laryngoscopy with biopsy; based on flexible laryngoscopy performed at bedside, I believe this will be most safely done with an awake tracheostomy to preceed DL and biopsy; will discuss with Anesthesia.   -Maintain NPO status -Patient Covid negative on 11/10 -Obtain consent for planned procedure. -Medical management per primary team  -Further recommendations pending results of above.   Thank you for allowing me to participate in the care of this patient. Please do not hesitate to contact me with any questions or concerns.   Jason Coop, [facility] Cell: [phone] 03/13/2020, 12:09 PM

## 2020-03-13 NOTE — Progress Notes (Signed)
SLP Cancellation Note  Patient Details Name: Veronica Horton MRN: [id number] DOB: 06-30-1960   Cancelled treatment:       Reason Eval/Treat Not Completed: Other (comment) (transfer to [facility] from [facility] s/p MBS with rec for dys3/thin diet, and pills crushed due to pt's laryngeal mass causing dysphagia,  currently NPO) [name], MS [place] Office [phone] Pager [phone]    [name] 03/13/2020, 7:59 AM

## 2020-03-13 NOTE — Anesthesia Preprocedure Evaluation (Addendum)
Anesthesia Evaluation  Patient identified by MRN, date of birth, ID band Patient awake    Reviewed: Allergy & Precautions, NPO status , Patient's Chart, lab work & pertinent test results, Unable to perform ROS - Chart review onlyPreop documentation limited or incomplete due to emergent nature of procedure.  Airway Mallampati: III  TM Distance: >3 FB Neck ROM: Limited    Dental  (+) Poor Dentition, Missing, Loose, Chipped   Pulmonary pneumonia, COPD, Current Smoker,     + decreased breath sounds + stridor     Cardiovascular hypertension, Pt. on medications  Rhythm:Regular Rate:Normal     Neuro/Psych negative neurological ROS     GI/Hepatic Neg liver ROS, hiatal hernia,   Endo/Other  negative endocrine ROS  Renal/GU negative Renal ROS     Musculoskeletal  (+) Arthritis ,   Abdominal (+) + obese,   Peds  Hematology negative hematology ROS (+)   Anesthesia Other Findings   Reproductive/Obstetrics                            Anesthesia Physical Anesthesia Plan  ASA: IV and emergent  Anesthesia Plan: MAC   Post-op Pain Management:    Induction: Intravenous  PONV Risk Score and Plan: 1 and Ondansetron, Propofol infusion, Treatment may vary due to age or medical condition and Midazolam  Airway Management Planned: Natural Airway  Additional Equipment:   Intra-op Plan:   Post-operative Plan:   Informed Consent: I have reviewed the patients History and Physical, chart, labs and discussed the procedure including the risks, benefits and alternatives for the proposed anesthesia with the patient or authorized representative who has indicated his/her understanding and acceptance.     Dental advisory given  Plan Discussed with: CRNA  Anesthesia Plan Comments:       Anesthesia Quick Evaluation

## 2020-03-13 NOTE — Consult Note (Signed)
ENT CONSULT:  Reason for Consult: Supraglottic Mass  Referring Physician:  Dr. Lonny Prude  HPI: Veronica Horton is an 59 y.o. female with past medical history of obesity, hiatal hernia, hypertension, bronchitis, COPD, chronic respiratory failure on home oxygen at 5 L/min, who presents as transfer from [facility].  Patient initially presented to [facility] on 03/10/2020 with complaints of shortness of breath and altered mental status.  Patient was admitted with acute exacerbation of COPD and placed on BiPAP.  Patient was evaluated by speech-language pathology.  Modified barium swallow study was performed on 11/11 which demonstrated edematous supraglottis and mild pharyngeal phase dysphagia; SLP recommended CT neck and ENT consultation.  CT of the neck was performed which demonstrated a bulky soft tissue mass in the supraglottis with narrowing of the airway, involvement of the right laryngeal cartilage with extension to the strap muscles and the right paraglottic space, questionable bilateral cervical adenopathy concerning for regional metastatic disease.  ENT was consulted and evaluated transfer to [facility].  On examination today, patient is sitting up in a chair.  She reports that her shortness of breath is significantly improved from initial presentation; she is currently on 5 L nasal cannula.  She states symptoms of dysphagia have been present for approximately 3 weeks prior to her presentation at [facility].  She denies any unintentional weight loss.  She states she was a current pack-a-day smoker up until her hospitalization, with a roughly 35-pack-year history.    Past Medical History:  Diagnosis Date   Bronchitis    Class 3 obesity 01/23/2020   COPD (chronic obstructive pulmonary disease) (HCC)    Degenerative disc disease, lumbar    Hiatal hernia    Hypertension     Past Surgical History:  Procedure Laterality Date   CHOLECYSTECTOMY     degenerative bone disease      INCISIONAL HERNIA REPAIR N/A 01/15/2019   Procedure: [name] HERNIORRHAPHY WITH MESH;  Surgeon: [name], MD;  Location: [facility];  Service: General;  Laterality: N/A;   OMENTECTOMY N/A 01/15/2019   Procedure: OMENTECTOMY;  Surgeon: [name], MD;  Location: [facility];  Service: General;  Laterality: N/A;    Family History  Problem Relation Age of Onset   Hypertension Mother    Sudden Cardiac Death Neg Hx     Social History:  reports that she has been smoking. She has been smoking about 1.00 pack per day. She has never used smokeless tobacco. She reports that she does not drink alcohol and does not use drugs.  Allergies:  Allergies  Allergen Reactions   Codeine Hives   Penicillins Hives    Did it involve swelling of the face/tongue/throat, SOB, or low BP? No Did it involve sudden or severe rash/hives, skin peeling, or any reaction on the inside of your mouth or nose? Yes Did you need to seek medical attention at a hospital or doctor's office? Unknown When did it last happen?Over 10 years If all above answers are NO, may proceed with cephalosporin use.     Medications: I have reviewed the patient's current medications.  Results for orders placed or performed during the hospital encounter of 03/10/20 (from the past 48 hour(s))  Basic metabolic panel     Status: Abnormal   Collection Time: 03/12/20  3:52 AM  Result Value Ref Range   Sodium 140 135 - 145 mmol/L   Potassium 4.0 3.5 - 5.1 mmol/L   Chloride 95 (L) 98 - 111 mmol/L  CO2 32 22 - 32 mmol/L   Glucose, Bld 131 (H) 70 - 99 mg/dL    Comment: Glucose reference range applies only to samples taken after fasting for at least 8 hours.   BUN 33 (H) 6 - 20 mg/dL   Creatinine, Ser 1.10 (H) 0.44 - 1.00 mg/dL   Calcium 9.7 8.9 - 10.3 mg/dL   GFR, Estimated 58 (L) >60 mL/min    Comment: (NOTE) Calculated using the CKD-EPI Creatinine Equation (2021)    Anion gap 13 5 - 15    Comment: Performed at [facility], [address] [place], [place] [address]  CBC     Status: Abnormal   Collection Time: 03/12/20  3:52 AM  Result Value Ref Range   WBC 18.7 (H) 4.0 - 10.5 K/uL   RBC 4.81 3.87 - 5.11 MIL/uL   Hemoglobin 15.0 12.0 - 15.0 g/dL   HCT 48.4 (H) 36 - 46 %   MCV 100.6 (H) 80.0 - 100.0 fL   MCH 31.2 26.0 - 34.0 pg   MCHC 31.0 30.0 - 36.0 g/dL   RDW 14.6 11.5 - 15.5 %   Platelets 248 150 - 400 K/uL   nRBC 0.0 0.0 - 0.2 %    Comment: Performed at [facility], [address], [place], [place] [address]  CBC     Status: Abnormal   Collection Time: 03/13/20  8:49 AM  Result Value Ref Range   WBC 10.2 4.0 - 10.5 K/uL   RBC 4.88 3.87 - 5.11 MIL/uL   Hemoglobin 15.0 12.0 - 15.0 g/dL   HCT 47.4 (H) 36 - 46 %   MCV 97.1 80.0 - 100.0 fL   MCH 30.7 26.0 - 34.0 pg   MCHC 31.6 30.0 - 36.0 g/dL   RDW 14.5 11.5 - 15.5 %   Platelets 208 150 - 400 K/uL   nRBC 0.0 0.0 - 0.2 %    Comment: Performed at [facility], [place] [address], [place], [place] [address]  Basic metabolic panel     Status: Abnormal   Collection Time: 03/13/20  8:49 AM  Result Value Ref Range   Sodium 140 135 - 145 mmol/L   Potassium 4.3 3.5 - 5.1 mmol/L   Chloride 98 98 - 111 mmol/L   CO2 32 22 - 32 mmol/L   Glucose, Bld 103 (H) 70 - 99 mg/dL    Comment: Glucose reference range applies only to samples taken after fasting for at least 8 hours.   BUN 37 (H) 6 - 20 mg/dL   Creatinine, Ser 1.16 (H) 0.44 - 1.00 mg/dL   Calcium 9.3 8.9 - 10.3 mg/dL   GFR, Estimated 54 (L) >60 mL/min    Comment: (NOTE) Calculated using the CKD-EPI Creatinine Equation (2021)    Anion gap 10 5 - 15    Comment: Performed at [facility] [address] [place], [place] [address]    CT SOFT TISSUE NECK W CONTRAST  Addendum Date: 03/11/2020   ADDENDUM REPORT: 03/11/2020 16:36 ADDENDUM: Study discussed by telephone with Dr. Jolaine Artist MEMON on 03/11/2020 at 1631 hours. Electronically Signed   By: Genevie Ann M.D.   On: 03/11/2020 16:36    Result Date: 03/11/2020 CLINICAL DATA:  59 year old female with dysphagia. Tonsillitis suspected. EXAM: CT NECK WITH CONTRAST TECHNIQUE: Multidetector CT imaging of the neck was performed using the standard protocol following the bolus administration of intravenous contrast. CONTRAST:  43mL OMNIPAQUE IOHEXOL 300 MG/ML  SOLN COMPARISON:  Chest CT 01/28/2019. FINDINGS: Pharynx and larynx: Bulky soft tissue mass throughout the bilateral supraglottic larynx and affecting the hypopharynx. Lobulated diffuse thickening of the epiglottis (series 2, image 53). Diffuse false cord and anterior commissure involvement with evidence of extension into the right paraglottic space on series 2 image 61 - where nodular direct extension of tumor and/or inseparable abnormal right level 3 lymph node protrudes on coronal image 58. Asymmetric erosion of the undersurface of the right thyroid cartilage on image 65. Asymmetric sclerosis of the right arytenoid on image 67., and midline extension of tumor is suspected through the anterior commissure a distance of about 11 mm as seen on series 2, image 67 and sagittal image 48. All told, tumor size is estimated at 37 x 52 by 45 mm (AP by transverse by CC). There is evidence of airway compromise. The true cords may be spared as seen on series 2, image 71. Subglottic larynx is within normal limits. Above the vallecula pharyngeal contours appear normal. Normal superior parapharyngeal and retropharyngeal spaces. Salivary glands: Negative sublingual space. Submandibular glands and parotid glands remain within normal limits. Thyroid: Negative. Lymph nodes: Malignant left level IIIb lymph node measures 17 mm short axis and 29 mm long axis (series 2, image 55 and coronal image 73). As stated above it is possible of malignant right level IIIa lymph node is inseparable from the parent tumor along the right paraglottic space (coronal image 58). Smaller but asymmetrically enlarged left level 4 nodes  measure up to 9 mm short axis (coronal image 72). No other abnormal or suspicious lymph nodes identified. Vascular: Major vascular structures in the neck and at the skull base remain patent including both internal jugular veins. The left vertebral artery appears dominant. Calcified atherosclerosis at the skull base. Limited intracranial: Negative. Visualized orbits: Negative. Mastoids and visualized paranasal sinuses: Clear. Skeleton: Absent and carious dentition. Cervical spine degeneration. No suspicious osseous lesion identified. 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 No superior mediastinal lymphadenopathy. Visible axillary lymph nodes are normal. Mild dependent atelectasis.  No upper lung nodule identified. IMPRESSION: 1. 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 Recommend ENT consultation. Involvement of right laryngeal cartilages, with extension in the midline anterior to the strap muscles, and also extension through the right paraglottic space and/or inseparable malignant right level IIIa lymph node. Estimated tumor long axis 5.2 cm. 2. Malignant contralateral left level 3b lymph node is 2.9 cm long axis. Smaller indeterminate left level 4 lymph nodes. 3. No distant metastatic disease identified in the neck or upper chest. Electronically Signed: By: Genevie Ann M.D. On: 03/11/2020 16:23   DG Swallowing Func-Speech Pathology  Result Date: 03/11/2020 Objective Swallowing Evaluation: Type of Study: MBS-Modified Barium Swallow Study  Patient Details Name: ARIANNI GALLEGO MRN: [id number] Date of Birth: 1960-08-28 Today's Date: 03/11/2020 Time: SLP Start Time (ACUTE ONLY): 3235 -SLP Stop Time (ACUTE ONLY): 5732 SLP Time Calculation (min) (ACUTE ONLY): 21 min Past Medical History: Past Medical History: Diagnosis Date  Bronchitis   Class 3 obesity 01/23/2020  COPD (chronic obstructive pulmonary disease) ([place])   Degenerative disc disease, lumbar   Hiatal  hernia   Hypertension  Past Surgical History: Past Surgical History: Procedure Laterality Date  CHOLECYSTECTOMY    degenerative bone disease    INCISIONAL HERNIA REPAIR N/A 01/15/2019  Procedure: [name] HERNIORRHAPHY WITH MESH;  Surgeon: [name], MD;  Location: [facility];  Service: General;  Laterality: N/A;  OMENTECTOMY N/A 01/15/2019  Procedure: OMENTECTOMY;  Surgeon: Arnoldo Morale,  Elta Guadeloupe, MD;  Location: [facility];  Service: General;  Laterality: N/A; HPI: Zinia Innocent  is a 59 y.o. female, with medical history significant of class III obesity, hiatal hernia, hypertension, bronchitis, COPD, chronic respiratory failure on home oxygen at 5 LPM, active smoker of 1-1/2 packs of cigarettes per day , patient presents to ED secondary to shortness of breath and altered mental status, patient with receptive worsening dyspnea over the last 4 days, she is not vaccinated against Covid, she is altered at the time of my examination, history was obtained from boyfriend at bedside, and ED staff, patient still smoking, she still using 5 L of oxygen, he is with increased work of breathing, significantly dyspneic, upon presentation to ED she is altered not provide any complaints at this point. BSE requested.  Subjective: "I haven't been able to swallow my pills for a couple weeks." Assessment / Plan / Recommendation CHL IP CLINICAL IMPRESSIONS 03/11/2020 Clinical Impression Pt presents with mild pharyngeal phase dysphagia, however appearance of anatomy appears edematous (epiglottis and aryepiglottic folds). Pt with limited dentition and requires extra time to masticate solids, swallow trigger generally at the level of the valleculae, Pt with blunted appearance of epiglottis with reduced deflection resulting in variable trace, flash penetration of thins during the swallow without aspiration and min vallecular residue with solids and brief stasis of barium tablet in valleculae. Pt with prominent cricopharyngeus. Recommend D3/mech  soft and thin liquids, po medications whole in puree and follow with liquid wash or per Pt preference. Also strongly recommend additional imaging (neck CT) and ENT consult pending those results. SLP will follow pending results of imaging. Above to RN and MD.  SLP Visit Diagnosis Dysphagia, oropharyngeal phase (R13.12) Attention and concentration deficit following -- Frontal lobe and executive function deficit following -- Impact on safety and function Mild aspiration risk   CHL IP TREATMENT RECOMMENDATION 03/11/2020 Treatment Recommendations No treatment recommended at this time   Prognosis 03/11/2020 Prognosis for Safe Diet Advancement Fair Barriers to Reach Goals Severity of deficits Barriers/Prognosis Comment -- CHL IP DIET RECOMMENDATION 03/11/2020 SLP Diet Recommendations Dysphagia 3 (Mech soft) solids;Thin liquid Liquid Administration via Cup;Straw Medication Administration Whole meds with puree Compensations Slow rate;Small sips/bites Postural Changes Remain semi-upright after after feeds/meals (Comment);Seated upright at 90 degrees   CHL IP OTHER RECOMMENDATIONS 03/11/2020 Recommended Consults Consider ENT evaluation Oral Care Recommendations Oral care BID Other Recommendations Clarify dietary restrictions   CHL IP FOLLOW UP RECOMMENDATIONS 03/11/2020 Follow up Recommendations None   CHL IP FREQUENCY AND DURATION 03/11/2020 Speech Therapy Frequency (ACUTE ONLY) min 2x/week Treatment Duration 1 week      CHL IP ORAL PHASE 03/11/2020 Oral Phase WFL Oral - Pudding Teaspoon -- Oral - Pudding Cup -- Oral - Honey Teaspoon -- Oral - Honey Cup -- Oral - Nectar Teaspoon -- Oral - Nectar Cup -- Oral - Nectar Straw -- Oral - Thin Teaspoon -- Oral - Thin Cup -- Oral - Thin Straw -- Oral - Puree -- Oral - Mech Soft -- Oral - Regular -- Oral - Multi-Consistency -- Oral - Pill -- Oral Phase - Comment --  CHL IP PHARYNGEAL PHASE 03/11/2020 Pharyngeal Phase Impaired Pharyngeal- Pudding Teaspoon -- Pharyngeal -- Pharyngeal-  Pudding Cup -- Pharyngeal -- Pharyngeal- Honey Teaspoon -- Pharyngeal -- Pharyngeal- Honey Cup -- Pharyngeal -- Pharyngeal- Nectar Teaspoon -- Pharyngeal -- Pharyngeal- Nectar Cup -- Pharyngeal -- Pharyngeal- Nectar Straw Pharyngeal residue - valleculae;Reduced epiglottic inversion Pharyngeal -- Pharyngeal- Thin Teaspoon Delayed swallow initiation-vallecula;Reduced epiglottic inversion Pharyngeal --  Pharyngeal- Thin Cup Reduced epiglottic inversion;Penetration/Aspiration during swallow Pharyngeal Material enters airway, remains ABOVE vocal cords then ejected out Pharyngeal- Thin Straw Reduced epiglottic inversion;Penetration/Aspiration during swallow Pharyngeal Material enters airway, remains ABOVE vocal cords then ejected out Pharyngeal- Puree WFL Pharyngeal -- Pharyngeal- Mechanical Soft -- Pharyngeal -- Pharyngeal- Regular Pharyngeal residue - valleculae;Reduced epiglottic inversion Pharyngeal -- Pharyngeal- Multi-consistency -- Pharyngeal -- Pharyngeal- Pill Pharyngeal residue - valleculae;Reduced epiglottic inversion Pharyngeal -- Pharyngeal Comment edematous pharynx  CHL IP CERVICAL ESOPHAGEAL PHASE 03/11/2020 Cervical Esophageal Phase Impaired Pudding Teaspoon -- Pudding Cup -- Honey Teaspoon -- Honey Cup -- Nectar Teaspoon -- Nectar Cup -- Nectar Straw -- Thin Teaspoon -- Thin Cup Prominent cricopharyngeal segment Thin Straw -- Puree -- Mechanical Soft -- Regular -- Multi-consistency -- Pill -- Cervical Esophageal Comment -- Thank you, [name], [place] 03/11/2020, 2:12 PM              ECHOCARDIOGRAM COMPLETE  Result Date: 03/11/2020    ECHOCARDIOGRAM REPORT   Patient Name:   JOLYNDA TOWNLEY Date of Exam: 03/11/2020 Medical Rec #:  [id number]       Height:       65.0 in Accession #:    [id number]      Weight:       244.0 lb Date of Birth:  07/13/1960       BSA:          2.153 m Patient Age:    66 years        BP:           115/35 mmHg Patient Gender: F               HR:            57 bpm. Exam Location:  [facility] Procedure: 2D Echo Indications:    Dyspnea 786.09 / R06.00  History:        Patient has prior history of Echocardiogram examinations, most                 recent 01/16/2019. COPD; Risk Factors:Hypertension and Current                 Smoker. Acute respiratory failure with hypoxia.  Sonographer:    Leavy Cella RDCS (AE) Referring Phys: [id number] [address] [place] IMPRESSIONS  1. Left ventricular ejection fraction, by estimation, is 70 to 75%. The left ventricle has hyperdynamic function. The left ventricle has no regional wall motion abnormalities. There is mild left ventricular hypertrophy. Left ventricular diastolic parameters were normal.  2. Right ventricular systolic function is normal. The right ventricular size is normal. Tricuspid regurgitation signal is inadequate for assessing PA pressure.  3. The mitral valve is grossly normal. Trivial mitral valve regurgitation.  4. The aortic valve is tricuspid. Aortic valve regurgitation is not visualized.  5. The inferior vena cava is normal in size with greater than 50% respiratory variability, suggesting right atrial pressure of 3 mmHg. FINDINGS  Left Ventricle: Left ventricular ejection fraction, by estimation, is 70 to 75%. The left ventricle has hyperdynamic function. The left ventricle has no regional wall motion abnormalities. The left ventricular internal cavity size was normal in size. There is mild left ventricular hypertrophy. Left ventricular diastolic parameters were normal. Right Ventricle: The right ventricular size is normal. No increase in right ventricular wall thickness. Right ventricular systolic function is normal. Tricuspid regurgitation signal is inadequate for assessing PA pressure. Left Atrium: Left atrial size was normal in size. Right Atrium:  Right atrial size was normal in size. Pericardium: There is no evidence of pericardial effusion. Mitral Valve: The mitral valve is grossly normal. Trivial mitral  valve regurgitation. Tricuspid Valve: The tricuspid valve is grossly normal. Tricuspid valve regurgitation is trivial. Aortic Valve: The aortic valve is tricuspid. Aortic valve regurgitation is not visualized. Pulmonic Valve: The pulmonic valve was not well visualized. Pulmonic valve regurgitation is not visualized. Aorta: The aortic root is normal in size and structure. Venous: The inferior vena cava is normal in size with greater than 50% respiratory variability, suggesting right atrial pressure of 3 mmHg. IAS/Shunts: No atrial level shunt detected by color flow Doppler.  LEFT VENTRICLE PLAX 2D LVIDd:         4.05 cm  Diastology LVIDs:         2.30 cm  LV e' medial:    8.59 cm/s LV PW:         1.35 cm  LV E/e' medial:  12.2 LV IVS:        1.34 cm  LV e' lateral:   11.00 cm/s LVOT diam:     2.00 cm  LV E/e' lateral: 9.5 LVOT Area:     3.14 cm  RIGHT VENTRICLE RV S prime:     15.40 cm/s TAPSE (M-mode): 2.9 cm LEFT ATRIUM             Index       RIGHT ATRIUM           Index LA diam:        4.70 cm 2.18 cm/m  RA Area:     13.50 cm LA Vol (A2C):   73.3 ml 34.05 ml/m RA Volume:   35.40 ml  16.44 ml/m LA Vol (A4C):   39.3 ml 18.26 ml/m LA Biplane Vol: 55.6 ml 25.83 ml/m   AORTA Ao Root diam: 2.70 cm MITRAL VALVE MV Area (PHT): 2.69 cm     SHUNTS MV Decel Time: 282 msec     Systemic Diam: 2.00 cm MV E velocity: 105.00 cm/s MV A velocity: 57.80 cm/s MV E/A ratio:  1.82 Rozann Lesches MD Electronically signed by Rozann Lesches MD Signature Date/Time: 03/11/2020/4:59:31 PM    Final     BJS:EGBTDV of Systems  Respiratory: Positive for cough and shortness of breath.     Blood pressure 136/64, pulse (!) 55, temperature 97.7 F (36.5 C), temperature source Oral, resp. rate 16, height 5\' 5"  (1.651 m), weight 110.7 kg, SpO2 99 %.   CONSTITUTIONAL: Obese female, sitting up in char, appears comfortable, in no no distress. Alert and oriented x 3 PULMONARY/CHEST WALL: On 5 L [place] with no increased work of  breathing, no stridor HENT: Head : normocephalic and atraumatic Nose: Mild crusting noted bilaterally of caudal septum at contact points of nasal cannula, no active bleeding appreciated Mouth/Throat: Very poor dentition with several broken and missing teeth, remaining dentition with active decay.  Floor mouth soft.  No evidence of mucosal lesions, uvula midline.  Throat: oropharynx clear and moist. 1+ tonsils Mucous membranes: normal EYES: conjunctiva normal, EOM normal and PERRL NECK: Bulky mass lesion noted at level of thyroid cartilage on right, questionable level II lymph node versus tumor extension, overlying skin normal, palpable tracheal landmarks. Left level III lymph node measuring approximately 2.5cm, mobile.   Studies Reviewed:CT neck reviewed  Flexible Fiberoptic Nasolaryngoscopy: Anesth: Topical with 4% lidocaine Compl: None Findings: Bulky exophytic mass lesion of the supraglottis with complete encasement of the epiglottis; unable to visualize true  vocal folds due to extent of mass lesion, unable to visualize pyriform sinuses bilaterally.   Description:  After discussing risks, benefits, and alternatives, the patient was placed in a seated position and the left nasal passage was sprayed with topical anesthetic.  The fiberoptic scope was passed through the left nasal passage to view the pharynx and larynx.  Findings are noted above.  The scope was then removed and she was returned to nursing care in stable condition.   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  -Patient presently stable on 5 L oxygen via nasal cannula.  -Patient will need direct laryngoscopy with biopsy; based on flexible laryngoscopy performed at bedside, I believe this will be most safely done with an awake tracheostomy to preceed DL and biopsy; will discuss with Anesthesia.   -Maintain NPO status -Patient Covid negative on 11/10 -Obtain consent for planned procedure. -Medical management per primary team  -Further recommendations pending results of above.   Thank you for allowing me to participate in the care of this patient. Please do not hesitate to contact me with any questions or concerns.   Jason Coop, [facility] Cell: [phone] 03/13/2020, 12:09 PM

## 2020-03-13 NOTE — Progress Notes (Signed)
Sinus bradycardia rate 58, SpO2 98% on 5 lpm RR 14 on arrival to Short Stay.

## 2020-03-13 NOTE — Progress Notes (Addendum)
[facility]  PROGRESS NOTE  Veronica Horton YQM:[id number] DOB: 1960-10-31 DOA: 03/10/2020 PCP: [facility] date - 03/10/2020   Admitting Physician Albertine Patricia, MD  Outpatient Primary MD for the patient is [facility]  LOS - 3 Brief Narrative  Veronica Horton is a 59 year old female with medical history significant for COPD with chronic hypoxic respiratory failure on 5 L, HTN, tobacco use, obesity class 3 who presented on 11/10 with worsening shortness of breath over the past 4 days and increased confusion.  Patient was found to have acute on chronic hypoxic hypercapnic respiratory failure secondary to COPD exacerbation that initially required BiPAP due to CO2 22, pH is 7.2 he quickly improved with IV steroids, IV Lasix and scheduled inhalers.  Hospital course complicated by dysphagia for 3 weeks evaluated with MBS barium swallow found to have edematous epiglottis/aryepiglottic folds and blunted appearance of epiglottis on MBS.  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   Currently stable on home oxygen requirements of 5 L, no increased work of breathing, quite hoarse with dysphonia -Currently n.p.o. -Continue to closely monitor respiratory status -ENT consulted for evaluation  Acute on chronic hypoxic/hypercapnic respiratory failure secondary to COPD exacerbation, resolved.   No wheezing on exam.  Stable on home O2 at 5 L. Normal mentation -Continue scheduled Breo, doxycycline -Currently on IV Solu-Medrol, given n.p.o. we will hold off on oral  prednisone -Continue Mucinex, incentive spirometry, flutter valve  GERD, stable -IV PPI, given n.p.o.  Tobacco use -cessation encouraged admission -Nicotine patch  Hypertension, currently at goal -holding home BP measurement given n.p.o. for now -IV hydralazine as needed  Class III obesity -Strongly encourage lifestyle changes, with outpatient PCP --recommend outpatient sleep study ( given polycythemia and elevated CO2)  Extremity edema, resolved.   TTE showed preserved EF.  Likely related to venous stasis given quick resolution and no longer requiring diuresis.  Doubt DVT given resolution -Close monitoring       Family Communication  :  none  Code Status :  FULL  Disposition Plan  :  Patient is from home. Anticipated d/c date: 2 to 3 days. Barriers to d/c or necessity for inpatient status:  Stable from a COPD standpoint however CT chest concerning for laryngeal mass, will need close monitoring of respiratory status and ENT evaluation Consults  :  ENT  Procedures  :  Barium Swallow  DVT Prophylaxis  :  Lovenox  MDM: The below labs and imaging reports were reviewed and summarized above.  Medication management as above.  Lab Results  Component Value Date   PLT 208 03/13/2020    Diet :  Diet Order            Diet NPO time specified  Diet effective now                  Inpatient Medications Scheduled Meds: . Chlorhexidine Gluconate Cloth  6 each Topical Daily  . dextromethorphan-guaiFENesin  1 tablet Oral BID  . enoxaparin (LOVENOX) injection  55 mg Subcutaneous Q24H  . feeding supplement  237  mL Oral BID BM  . fluticasone furoate-vilanterol  1 puff Inhalation Daily  . mouth rinse  15 mL Mouth Rinse BID  . methylPREDNISolone (SOLU-MEDROL) injection  40 mg Intravenous Q6H  . multivitamin with minerals  1 tablet Oral Daily  . nicotine  21 mg Transdermal Daily  . pantoprazole (PROTONIX) IV  40 mg Intravenous Q24H   Continuous Infusions: . doxycycline  (VIBRAMYCIN) IV 100 mg (03/13/20 3762)  . lactated ringers 50 mL/hr at 03/13/20 0451   PRN Meds:.albuterol, ipratropium-albuterol  Antibiotics  :   Anti-infectives (From admission, onward)   Start     Dose/Rate Route Frequency Ordered Stop   03/10/20 1730  doxycycline (VIBRAMYCIN) 100 mg in sodium chloride 0.9 % 250 mL IVPB        100 mg 125 mL/hr over 120 Minutes Intravenous Every 12 hours 03/10/20 1634     03/10/20 1600  azithromycin (ZITHROMAX) 500 mg in sodium chloride 0.9 % 250 mL IVPB  Status:  Discontinued        500 mg 250 mL/hr over 60 Minutes Intravenous Every 24 hours 03/10/20 1529 03/10/20 1538   03/10/20 1500  clindamycin (CLEOCIN) IVPB 600 mg  Status:  Discontinued        600 mg 100 mL/hr over 30 Minutes Intravenous  Once 03/10/20 1458 03/10/20 1521       Objective   Vitals:   03/13/20 0019 03/13/20 0350 03/13/20 0747 03/13/20 0859  BP: 129/69 133/62 136/64   Pulse: 68 60 (!) 55   Resp: 15 16 16    Temp: 98.4 F (36.9 C) (!) 97.5 F (36.4 C) 97.7 F (36.5 C)   TempSrc: Oral Axillary Oral   SpO2: 95% 97% 92% 99%  Weight:      Height:        SpO2: 99 % O2 Flow Rate (L/min): 4 L/min FiO2 (%): 36 %  Wt Readings from Last 3 Encounters:  03/10/20 110.7 kg  01/24/20 111.8 kg  01/30/19 (!) 136.3 kg     Intake/Output Summary (Last 24 hours) at 03/13/2020 1203 Last data filed at 03/13/2020 0350 Gross per 24 hour  Intake 250 ml  Output 500 ml  Net -250 ml    Physical Exam:   Awake Alert, Oriented X 3, Normal affect, no distress Hoarse voice No new F.N deficits,  .AT, Normal respiratory effort on 5 L, no wheezing, normal breath sounds, but some conversation dyspnea RRR,No Gallops,Rubs or new Murmurs,  +ve B.Sounds, Abd Soft, No tenderness, No rebound, guarding or rigidity. No Cyanosis, No new Rash or bruise     I have personally reviewed the following:   Data Reviewed:  CBC Recent Labs  Lab 03/10/20 1227 03/12/20 0352 03/13/20 0849   WBC 10.1 18.7* 10.2  HGB 14.5 15.0 15.0  HCT 47.2* 48.4* 47.4*  PLT 220 248 208  MCV 101.9* 100.6* 97.1  MCH 31.3 31.2 30.7  MCHC 30.7 31.0 31.6  RDW 14.7 14.6 14.5  LYMPHSABS 1.0  --   --   MONOABS 0.7  --   --   EOSABS 0.2  --   --   BASOSABS 0.0  --   --     Chemistries  Recent Labs  Lab 03/10/20 1226 03/10/20 1600 03/12/20 0352 03/13/20 0849  NA 137  --  140 140  K 4.6  --  4.0 4.3  CL 96*  --  95* 98  CO2 34*  --  32 32  GLUCOSE 104*  --  131* 103*  BUN 16  --  33* 37*  CREATININE 1.01*  --  1.10* 1.16*  CALCIUM 8.6*  --  9.7 9.3  MG  --  2.3  --   --   AST 14*  --   --   --   ALT 12  --   --   --   ALKPHOS 79  --   --   --   BILITOT 0.8  --   --   --    ------------------------------------------------------------------------------------------------------------------ No results for input(s): CHOL, HDL, LDLCALC, TRIG, CHOLHDL, LDLDIRECT in the last 72 hours.  No results found for: HGBA1C ------------------------------------------------------------------------------------------------------------------ No results for input(s): TSH, T4TOTAL, T3FREE, THYROIDAB in the last 72 hours.  Invalid input(s): FREET3 ------------------------------------------------------------------------------------------------------------------ No results for input(s): VITAMINB12, FOLATE, FERRITIN, TIBC, IRON, RETICCTPCT in the last 72 hours.  Coagulation profile No results for input(s): INR, PROTIME in the last 168 hours.  No results for input(s): DDIMER in the last 72 hours.  Cardiac Enzymes No results for input(s): CKMB, TROPONINI, MYOGLOBIN in the last 168 hours.  Invalid input(s): CK ------------------------------------------------------------------------------------------------------------------    Component Value Date/Time   BNP 183.0 (H) 03/10/2020 1227    Micro Results Recent Results (from the past 240 hour(s))  Respiratory Panel by RT PCR (Flu A&B, Covid) -  Nasopharyngeal Swab     Status: None   Collection Time: 03/10/20 12:26 PM   Specimen: Nasopharyngeal Swab  Result Value Ref Range Status   SARS Coronavirus 2 by RT PCR NEGATIVE NEGATIVE Final    Comment: (NOTE) SARS-CoV-2 target nucleic acids are NOT DETECTED.  The SARS-CoV-2 RNA is generally detectable in upper respiratoy specimens during the acute phase of infection. The lowest concentration of SARS-CoV-2 viral copies this assay can detect is 131 copies/mL. A negative result does not preclude SARS-Cov-2 infection and should not be used as the sole basis for treatment or other patient management decisions. A negative result may occur with  improper specimen collection/handling, submission of specimen other than nasopharyngeal swab, presence of viral mutation(s) within the areas targeted by this assay, and inadequate number of viral copies (<131 copies/mL). A negative result must be combined with clinical observations, patient history, and epidemiological information. The expected result is Negative.  Fact Sheet for Patients:  PinkCheek.be  Fact Sheet for Healthcare Providers:  GravelBags.it  This test is no t yet approved or cleared by the [place] FDA and  has been authorized for detection and/or diagnosis of SARS-CoV-2 by FDA under an Emergency Use Authorization (EUA). This EUA will remain  in effect (meaning this test can be used) for the duration of the COVID-19 declaration under Section 564(b)(1) of the Act, 21 U.S.C. section 360bbb-3(b)(1), unless the authorization is terminated or revoked sooner.     Influenza A by PCR NEGATIVE NEGATIVE Final   Influenza B by PCR NEGATIVE NEGATIVE Final    Comment: (NOTE) The Xpert Xpress SARS-CoV-2/FLU/RSV assay is intended as an aid in  the diagnosis of influenza from Nasopharyngeal swab specimens and  should not be used as a sole basis for treatment. Nasal washings and   aspirates are unacceptable for Xpert Xpress SARS-CoV-2/FLU/RSV  testing.  Fact Sheet for Patients: PinkCheek.be  Fact Sheet for Healthcare Providers: GravelBags.it  This test is not yet approved or cleared by the [place] FDA and  has been authorized for detection and/or diagnosis of SARS-CoV-2 by  FDA under an Emergency Use Authorization (EUA). This EUA will remain  in effect (meaning this test can be used) for  the duration of the  Covid-19 declaration under Section 564(b)(1) of the Act, 21  U.S.C. section 360bbb-3(b)(1), unless the authorization is  terminated or revoked. Performed at [facility], [address], [place], [place] [address]   MRSA PCR Screening     Status: None   Collection Time: 03/10/20  5:08 PM   Specimen: Nasopharyngeal  Result Value Ref Range Status   MRSA by PCR NEGATIVE NEGATIVE Final    Comment:        The GeneXpert MRSA Assay (FDA approved for NASAL specimens only), is one component of a comprehensive MRSA colonization surveillance program. It is not intended to diagnose MRSA infection nor to guide or monitor treatment for MRSA infections. Performed at [facility] [address], [place], [place] [address]     Radiology Reports CT SOFT TISSUE NECK W CONTRAST  Addendum Date: 03/11/2020   ADDENDUM REPORT: 03/11/2020 16:36 ADDENDUM: Study discussed by telephone with Dr. Jolaine Artist MEMON on 03/11/2020 at 1631 hours. Electronically Signed   By: Genevie Ann M.D.   On: 03/11/2020 16:36   Result Date: 03/11/2020 CLINICAL DATA:  59 year old female with dysphagia. Tonsillitis suspected. EXAM: CT NECK WITH CONTRAST TECHNIQUE: Multidetector CT imaging of the neck was performed using the standard protocol following the bolus administration of intravenous contrast. CONTRAST:  18mL OMNIPAQUE IOHEXOL 300 MG/ML  SOLN COMPARISON:  Chest CT 01/28/2019. FINDINGS: Pharynx and larynx: Bulky soft tissue mass  throughout the bilateral supraglottic larynx and affecting the hypopharynx. Lobulated diffuse thickening of the epiglottis (series 2, image 53). Diffuse false cord and anterior commissure involvement with evidence of extension into the right paraglottic space on series 2 image 61 - where nodular direct extension of tumor and/or inseparable abnormal right level 3 lymph node protrudes on coronal image 58. Asymmetric erosion of the undersurface of the right thyroid cartilage on image 65. Asymmetric sclerosis of the right arytenoid on image 67., and midline extension of tumor is suspected through the anterior commissure a distance of about 11 mm as seen on series 2, image 67 and sagittal image 48. All told, tumor size is estimated at 37 x 52 by 45 mm (AP by transverse by CC). There is evidence of airway compromise. The true cords may be spared as seen on series 2, image 71. Subglottic larynx is within normal limits. Above the vallecula pharyngeal contours appear normal. Normal superior parapharyngeal and retropharyngeal spaces. Salivary glands: Negative sublingual space. Submandibular glands and parotid glands remain within normal limits. Thyroid: Negative. Lymph nodes: Malignant left level IIIb lymph node measures 17 mm short axis and 29 mm long axis (series 2, image 55 and coronal image 73). As stated above it is possible of malignant right level IIIa lymph node is inseparable from the parent tumor along the right paraglottic space (coronal image 58). Smaller but asymmetrically enlarged left level 4 nodes measure up to 9 mm short axis (coronal image 72). No other abnormal or suspicious lymph nodes identified. Vascular: Major vascular structures in the neck and at the skull base remain patent including both internal jugular veins. The left vertebral artery appears dominant. Calcified atherosclerosis at the skull base. Limited intracranial: Negative. Visualized orbits: Negative. Mastoids and visualized paranasal  sinuses: Clear. Skeleton: Absent and carious dentition. Cervical spine degeneration. No suspicious osseous lesion identified. 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 No superior mediastinal lymphadenopathy. Visible axillary lymph nodes are normal. Mild dependent atelectasis.  No upper lung nodule identified. IMPRESSION: 1. 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 Recommend ENT consultation. Involvement of right laryngeal cartilages, with extension in the midline anterior to the strap muscles, and also extension through the right paraglottic space and/or inseparable malignant right level IIIa lymph node. Estimated tumor long axis 5.2 cm. 2. Malignant contralateral left level 3b lymph node is 2.9 cm long axis. Smaller indeterminate left level 4 lymph nodes. 3. No distant metastatic disease identified in the neck or upper chest. Electronically Signed: By: Genevie Ann M.D. On: 03/11/2020 16:23   DG Chest Portable 1 View  Result Date: 03/10/2020 CLINICAL DATA:  Shortness of breath and cough EXAM: PORTABLE CHEST 1 VIEW COMPARISON:  January 23, 2020 FINDINGS: There is bibasilar atelectatic change. Elsewhere the interstitium is mildly thickened. No consolidation. Heart size and pulmonary vascularity are normal. No adenopathy. There is degenerative change in each shoulder. IMPRESSION: Bibasilar atelectasis. Interstitial thickening likely represents a degree of underlying chronic bronchitis. No edema or airspace opacity. Heart size within normal limits. Electronically Signed   By: Lowella Grip III M.D.   On: 03/10/2020 12:42   DG Swallowing Func-Speech Pathology  Result Date: 03/11/2020 Objective Swallowing Evaluation: Type of Study: MBS-Modified Barium Swallow Study  Patient Details Name: Veronica Horton MRN: [id number] Date of Birth: 19-Sep-1960 Today's Date: 03/11/2020 Time: SLP Start Time (ACUTE ONLY): 7412 -SLP Stop Time (ACUTE ONLY): 1316 SLP Time  Calculation (min) (ACUTE ONLY): 21 min Past Medical History: Past Medical History: Diagnosis Date . Bronchitis  . Class 3 obesity 01/23/2020 . COPD (chronic obstructive pulmonary disease) ([place])  . Degenerative disc disease, lumbar  . Hiatal hernia  . Hypertension  Past Surgical History: Past Surgical History: Procedure Laterality Date . CHOLECYSTECTOMY   . degenerative bone disease   . INCISIONAL HERNIA REPAIR N/A 01/15/2019  Procedure: [name] HERNIORRHAPHY WITH MESH;  Surgeon: [name], MD;  Location: [facility];  Service: General;  Laterality: N/A; . OMENTECTOMY N/A 01/15/2019  Procedure: OMENTECTOMY;  Surgeon: [name], MD;  Location: [facility];  Service: General;  Laterality: N/A; HPI: Veronica Horton  is a 59 y.o. female, with medical history significant of class III obesity, hiatal hernia, hypertension, bronchitis, COPD, chronic respiratory failure on home oxygen at 5 LPM, active smoker of 1-1/2 packs of cigarettes per day , patient presents to ED secondary to shortness of breath and altered mental status, patient with receptive worsening dyspnea over the last 4 days, she is not vaccinated against Covid, she is altered at the time of my examination, history was obtained from boyfriend at bedside, and ED staff, patient still smoking, she still using 5 L of oxygen, he is with increased work of breathing, significantly dyspneic, upon presentation to ED she is altered not provide any complaints at this point. BSE requested.  Subjective: "I haven't been able to swallow my pills for a couple weeks." Assessment / Plan / Recommendation CHL IP CLINICAL IMPRESSIONS 03/11/2020 Clinical Impression Pt presents with mild pharyngeal phase dysphagia, however appearance of anatomy appears edematous (epiglottis and aryepiglottic folds). Pt with limited dentition and requires extra time to masticate solids, swallow trigger generally at the level of the valleculae, Pt with blunted appearance of epiglottis with reduced  deflection resulting in variable trace, flash penetration of thins during the swallow without aspiration and min vallecular residue with solids and brief stasis of barium tablet in valleculae. Pt with prominent cricopharyngeus. Recommend D3/mech soft and thin liquids, po medications whole in puree and follow with liquid wash or per Pt preference. Also strongly recommend additional imaging (neck CT) and ENT  consult pending those results. SLP will follow pending results of imaging. Above to RN and MD.  SLP Visit Diagnosis Dysphagia, oropharyngeal phase (R13.12) Attention and concentration deficit following -- Frontal lobe and executive function deficit following -- Impact on safety and function Mild aspiration risk   CHL IP TREATMENT RECOMMENDATION 03/11/2020 Treatment Recommendations No treatment recommended at this time   Prognosis 03/11/2020 Prognosis for Safe Diet Advancement Fair Barriers to Reach Goals Severity of deficits Barriers/Prognosis Comment -- CHL IP DIET RECOMMENDATION 03/11/2020 SLP Diet Recommendations Dysphagia 3 (Mech soft) solids;Thin liquid Liquid Administration via Cup;Straw Medication Administration Whole meds with puree Compensations Slow rate;Small sips/bites Postural Changes Remain semi-upright after after feeds/meals (Comment);Seated upright at 90 degrees   CHL IP OTHER RECOMMENDATIONS 03/11/2020 Recommended Consults Consider ENT evaluation Oral Care Recommendations Oral care BID Other Recommendations Clarify dietary restrictions   CHL IP FOLLOW UP RECOMMENDATIONS 03/11/2020 Follow up Recommendations None   CHL IP FREQUENCY AND DURATION 03/11/2020 Speech Therapy Frequency (ACUTE ONLY) min 2x/week Treatment Duration 1 week      CHL IP ORAL PHASE 03/11/2020 Oral Phase WFL Oral - Pudding Teaspoon -- Oral - Pudding Cup -- Oral - Honey Teaspoon -- Oral - Honey Cup -- Oral - Nectar Teaspoon -- Oral - Nectar Cup -- Oral - Nectar Straw -- Oral - Thin Teaspoon -- Oral - Thin Cup -- Oral - Thin Straw  -- Oral - Puree -- Oral - Mech Soft -- Oral - Regular -- Oral - Multi-Consistency -- Oral - Pill -- Oral Phase - Comment --  CHL IP PHARYNGEAL PHASE 03/11/2020 Pharyngeal Phase Impaired Pharyngeal- Pudding Teaspoon -- Pharyngeal -- Pharyngeal- Pudding Cup -- Pharyngeal -- Pharyngeal- Honey Teaspoon -- Pharyngeal -- Pharyngeal- Honey Cup -- Pharyngeal -- Pharyngeal- Nectar Teaspoon -- Pharyngeal -- Pharyngeal- Nectar Cup -- Pharyngeal -- Pharyngeal- Nectar Straw Pharyngeal residue - valleculae;Reduced epiglottic inversion Pharyngeal -- Pharyngeal- Thin Teaspoon Delayed swallow initiation-vallecula;Reduced epiglottic inversion Pharyngeal -- Pharyngeal- Thin Cup Reduced epiglottic inversion;Penetration/Aspiration during swallow Pharyngeal Material enters airway, remains ABOVE vocal cords then ejected out Pharyngeal- Thin Straw Reduced epiglottic inversion;Penetration/Aspiration during swallow Pharyngeal Material enters airway, remains ABOVE vocal cords then ejected out Pharyngeal- Puree WFL Pharyngeal -- Pharyngeal- Mechanical Soft -- Pharyngeal -- Pharyngeal- Regular Pharyngeal residue - valleculae;Reduced epiglottic inversion Pharyngeal -- Pharyngeal- Multi-consistency -- Pharyngeal -- Pharyngeal- Pill Pharyngeal residue - valleculae;Reduced epiglottic inversion Pharyngeal -- Pharyngeal Comment edematous pharynx  CHL IP CERVICAL ESOPHAGEAL PHASE 03/11/2020 Cervical Esophageal Phase Impaired Pudding Teaspoon -- Pudding Cup -- Honey Teaspoon -- Honey Cup -- Nectar Teaspoon -- Nectar Cup -- Nectar Straw -- Thin Teaspoon -- Thin Cup Prominent cricopharyngeal segment Thin Straw -- Puree -- Mechanical Soft -- Regular -- Multi-consistency -- Pill -- Cervical Esophageal Comment -- Thank you, [name], [place] 03/11/2020, 2:12 PM              ECHOCARDIOGRAM COMPLETE  Result Date: 03/11/2020    ECHOCARDIOGRAM REPORT   Patient Name:   Veronica Horton Date of Exam: 03/11/2020 Medical Rec #:   [id number]       Height:       65.0 in Accession #:    [id number]      Weight:       244.0 lb Date of Birth:  [birth year]       BSA:          2.153 m Patient Age:    58 years        BP:  115/35 mmHg Patient Gender: F               HR:           57 bpm. Exam Location:  [facility] Procedure: 2D Echo Indications:    Dyspnea 786.09 / R06.00  History:        Patient has prior history of Echocardiogram examinations, most                 recent 01/16/2019. COPD; Risk Factors:Hypertension and Current                 Smoker. Acute respiratory failure with hypoxia.  Sonographer:    Leavy Cella RDCS (AE) Referring Phys: [id number] [address] [place] IMPRESSIONS  1. Left ventricular ejection fraction, by estimation, is 70 to 75%. The left ventricle has hyperdynamic function. The left ventricle has no regional wall motion abnormalities. There is mild left ventricular hypertrophy. Left ventricular diastolic parameters were normal.  2. Right ventricular systolic function is normal. The right ventricular size is normal. Tricuspid regurgitation signal is inadequate for assessing PA pressure.  3. The mitral valve is grossly normal. Trivial mitral valve regurgitation.  4. The aortic valve is tricuspid. Aortic valve regurgitation is not visualized.  5. The inferior vena cava is normal in size with greater than 50% respiratory variability, suggesting right atrial pressure of 3 mmHg. FINDINGS  Left Ventricle: Left ventricular ejection fraction, by estimation, is 70 to 75%. The left ventricle has hyperdynamic function. The left ventricle has no regional wall motion abnormalities. The left ventricular internal cavity size was normal in size. There is mild left ventricular hypertrophy. Left ventricular diastolic parameters were normal. Right Ventricle: The right ventricular size is normal. No increase in right ventricular wall thickness. Right ventricular systolic function is normal. Tricuspid regurgitation signal is inadequate for  assessing PA pressure. Left Atrium: Left atrial size was normal in size. Right Atrium: Right atrial size was normal in size. Pericardium: There is no evidence of pericardial effusion. Mitral Valve: The mitral valve is grossly normal. Trivial mitral valve regurgitation. Tricuspid Valve: The tricuspid valve is grossly normal. Tricuspid valve regurgitation is trivial. Aortic Valve: The aortic valve is tricuspid. Aortic valve regurgitation is not visualized. Pulmonic Valve: The pulmonic valve was not well visualized. Pulmonic valve regurgitation is not visualized. Aorta: The aortic root is normal in size and structure. Venous: The inferior vena cava is normal in size with greater than 50% respiratory variability, suggesting right atrial pressure of 3 mmHg. IAS/Shunts: No atrial level shunt detected by color flow Doppler.  LEFT VENTRICLE PLAX 2D LVIDd:         4.05 cm  Diastology LVIDs:         2.30 cm  LV e' medial:    8.59 cm/s LV PW:         1.35 cm  LV E/e' medial:  12.2 LV IVS:        1.34 cm  LV e' lateral:   11.00 cm/s LVOT diam:     2.00 cm  LV E/e' lateral: 9.5 LVOT Area:     3.14 cm  RIGHT VENTRICLE RV S prime:     15.40 cm/s TAPSE (M-mode): 2.9 cm LEFT ATRIUM             Index       RIGHT ATRIUM           Index LA diam:        4.70 cm 2.18 cm/m  RA  Area:     13.50 cm LA Vol (A2C):   73.3 ml 34.05 ml/m RA Volume:   35.40 ml  16.44 ml/m LA Vol (A4C):   39.3 ml 18.26 ml/m LA Biplane Vol: 55.6 ml 25.83 ml/m   AORTA Ao Root diam: 2.70 cm MITRAL VALVE MV Area (PHT): 2.69 cm     SHUNTS MV Decel Time: 282 msec     Systemic Diam: 2.00 cm MV E velocity: 105.00 cm/s MV A velocity: 57.80 cm/s MV E/A ratio:  1.82 Rozann Lesches MD Electronically signed by Rozann Lesches MD Signature Date/Time: 03/11/2020/4:59:31 PM    Final      Time Spent in minutes  30     Desiree Hane M.D on 03/13/2020 at 12:03 PM  To page go to [email or web address] - password [facility]

## 2020-03-14 ENCOUNTER — Encounter (HOSPITAL_COMMUNITY): Payer: Self-pay | Admitting: Otolaryngology

## 2020-03-14 ENCOUNTER — Inpatient Hospital Stay (HOSPITAL_COMMUNITY): Payer: Medicaid Other

## 2020-03-14 DIAGNOSIS — J181 Lobar pneumonia, unspecified organism: Secondary | ICD-10-CM

## 2020-03-14 DIAGNOSIS — J441 Chronic obstructive pulmonary disease with (acute) exacerbation: Principal | ICD-10-CM

## 2020-03-14 DIAGNOSIS — R0902 Hypoxemia: Secondary | ICD-10-CM

## 2020-03-14 DIAGNOSIS — I10 Essential (primary) hypertension: Secondary | ICD-10-CM

## 2020-03-14 DIAGNOSIS — F17210 Nicotine dependence, cigarettes, uncomplicated: Secondary | ICD-10-CM

## 2020-03-14 DIAGNOSIS — C329 Malignant neoplasm of larynx, unspecified: Secondary | ICD-10-CM

## 2020-03-14 LAB — BLOOD GAS, ARTERIAL
Acid-Base Excess: 6.5 mmol/L — ABNORMAL HIGH (ref 0.0–2.0)
Bicarbonate: 31.7 mmol/L — ABNORMAL HIGH (ref 20.0–28.0)
Drawn by: 13746
FIO2: 100
O2 Saturation: 97.8 %
Patient temperature: 36.6
pCO2 arterial: 55.4 mmHg — ABNORMAL HIGH (ref 32.0–48.0)
pH, Arterial: 7.373 (ref 7.350–7.450)
pO2, Arterial: 108 mmHg (ref 83.0–108.0)

## 2020-03-14 LAB — CBC
HCT: 47.8 % — ABNORMAL HIGH (ref 36.0–46.0)
Hemoglobin: 15.5 g/dL — ABNORMAL HIGH (ref 12.0–15.0)
MCH: 31.8 pg (ref 26.0–34.0)
MCHC: 32.4 g/dL (ref 30.0–36.0)
MCV: 98 fL (ref 80.0–100.0)
Platelets: 203 10*3/uL (ref 150–400)
RBC: 4.88 MIL/uL (ref 3.87–5.11)
RDW: 14.5 % (ref 11.5–15.5)
WBC: 10.6 10*3/uL — ABNORMAL HIGH (ref 4.0–10.5)
nRBC: 0 % (ref 0.0–0.2)

## 2020-03-14 LAB — BASIC METABOLIC PANEL
Anion gap: 12 (ref 5–15)
BUN: 39 mg/dL — ABNORMAL HIGH (ref 6–20)
CO2: 28 mmol/L (ref 22–32)
Calcium: 8.9 mg/dL (ref 8.9–10.3)
Chloride: 101 mmol/L (ref 98–111)
Creatinine, Ser: 1.1 mg/dL — ABNORMAL HIGH (ref 0.44–1.00)
GFR, Estimated: 58 mL/min — ABNORMAL LOW (ref >60)
Glucose, Bld: 112 mg/dL — ABNORMAL HIGH (ref 70–99)
Potassium: 4.5 mmol/L (ref 3.5–5.1)
Sodium: 141 mmol/L (ref 135–145)

## 2020-03-14 LAB — GLUCOSE, CAPILLARY
Glucose-Capillary: 109 mg/dL — ABNORMAL HIGH (ref 70–99)
Glucose-Capillary: 114 mg/dL — ABNORMAL HIGH (ref 70–99)
Glucose-Capillary: 79 mg/dL (ref 70–99)
Glucose-Capillary: 95 mg/dL (ref 70–99)
Glucose-Capillary: 94 mg/dL (ref 70–99)
Glucose-Capillary: 105 mg/dL — ABNORMAL HIGH (ref 70–99)

## 2020-03-14 MED ORDER — IPRATROPIUM-ALBUTEROL 0.5-2.5 (3) MG/3ML IN SOLN
3.0000 mL | Freq: Four times a day (QID) | RESPIRATORY_TRACT | Status: DC
  Administered 2020-03-14 – 2020-03-20 (×23): 3 mL via RESPIRATORY_TRACT
  Filled 2020-03-14 (×23): qty 3

## 2020-03-14 MED ORDER — FENTANYL CITRATE (PF) 100 MCG/2ML IJ SOLN
INTRAMUSCULAR | Status: AC
  Filled 2020-03-14: qty 2

## 2020-03-14 MED ORDER — IPRATROPIUM-ALBUTEROL 0.5-2.5 (3) MG/3ML IN SOLN: 3.0000 mL | Freq: Four times a day (QID) | RESPIRATORY_TRACT | Status: DC

## 2020-03-14 MED ORDER — CHLORHEXIDINE GLUCONATE 0.12% ORAL RINSE (MEDLINE KIT)
15.0000 mL | Freq: Two times a day (BID) | OROMUCOSAL | Status: DC
  Administered 2020-03-14 – 2020-03-23 (×18): 15 mL via OROMUCOSAL

## 2020-03-14 MED ORDER — LEVOFLOXACIN IN D5W 750 MG/150ML IV SOLN
750.0000 mg | Freq: Every day | INTRAVENOUS | Status: AC
  Administered 2020-03-14 – 2020-03-19 (×6): 750 mg via INTRAVENOUS
  Filled 2020-03-14 (×6): qty 150

## 2020-03-14 MED ORDER — FENTANYL CITRATE (PF) 100 MCG/2ML IJ SOLN
25.0000 ug | INTRAMUSCULAR | Status: DC | PRN
  Administered 2020-03-14 – 2020-03-27 (×20): 25 ug via INTRAVENOUS
  Filled 2020-03-14 (×20): qty 2

## 2020-03-14 MED ORDER — ORAL CARE MOUTH RINSE
15.0000 mL | OROMUCOSAL | Status: DC
  Administered 2020-03-14 – 2020-03-23 (×75): 15 mL via OROMUCOSAL

## 2020-03-14 NOTE — Progress Notes (Signed)
Pt in apparent distress, with O2 desaturations into upper 70's. RT at bedside, and CCM MD (Dr Ander Slade) notified of status change, since he was present and nearby on the unit. Pt placed on ventilator for respiratory support per MD. O2 sats greatly improved once on mechanical ventilation. STAT CXR ordered. RT will obtain ABG. Admitting physician, Dr Oretha Milch with [facility], paged to inform her of pt's status change. New orders received for pain meds prn and consult to CCM for vent management. Will continue to monitor.

## 2020-03-14 NOTE — Progress Notes (Addendum)
[facility]  PROGRESS NOTE  Veronica Horton BLT:[id number] DOB: 1960/11/26 DOA: 03/10/2020 PCP: [facility] date - 03/10/2020   Admitting Physician Albertine Patricia, MD  Outpatient Primary MD for the patient is [facility]  LOS - 4 Brief Narrative  Veronica Horton is a 59 year old female with medical history significant for COPD with chronic hypoxic respiratory failure on 5 L, HTN, tobacco use, obesity class 3 who presented on 11/10 with worsening shortness of breath over the past 4 days and increased confusion.  Patient was found to have acute on chronic hypoxic hypercapnic respiratory failure secondary to COPD exacerbation that initially required BiPAP due to CO2 22, pH is 7.2 he quickly improved with IV steroids, IV Lasix and scheduled inhalers.  Hospital course complicated by dysphagia for 3 weeks evaluated with MBS barium swallow found to have edematous epiglottis/aryepiglottic folds and blunted appearance of epiglottis on MBS.  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  Patient was transferred from [facility] to [facility] for formal ENT consultation/evaluation.  Patient underwent laryngeal mass biopsy and resultant tracheostomy 11/13 and transferred to ICU for postop care.  In a.m. of 11/14 patient's oxygen levels desaturated on 80% FiO2 via trach collar despite suctioning.  Stat chest x-ray was obtained with breathing treatments, critical care Dr. [place] Bali patient by request of bedside respiratory therapist.  Subjective  This morning now on ventilator via trach with improvement in oxygen saturations 96%.  Patient still able to respond appropriately to voice and using suction enough.  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  Status post  laryngeal mass biopsy on 11/13 and now with tracheostomy.  Currently requiring vent support (started postoperative), -Continue to wean vent support in ICU, wean vent as tolerated -Currently n.p.o., will need core track for nutrition -Pathology pending, will consult heme-onc/radiation oncology -ENT also recommends consulting dentistry to remove remaining of teeth -Appreciate ENT recommendations, speech and swallow therapy once cuff deflated (okay to deflate after 24 hours if not requiring vent support)  Acute on chronic hypoxic/hypercapnic respiratory failure, multifactorial etiology including COPD exacerbation, complicated by laryngeal mass requiring tracheostomy on 11/13, LUL pneumonia/left pleural effusion.  COPD exacerbation had resolved but after tracheostomy worsening resptiratory statusNow receiving oxygenation via ventilator via trach to maintain O2 saturations greater than 88%.  Normal mentation, following commands. CXR shows areas of patchy airspace disease concerning for left upper lobe pneumoniae and equivocal left pleural effusion Home regimen 5 L O2 nasal cannula -Continue vent support, hopeful to be able to wean down throughout the day, will continue to monitor in ICU -Appreciate assistance from PCCM who agrees with chest x-ray, further monitoring with ABG and close monitoring respiratory status -Continue scheduled Breo, discontinue doxycycline, change to levaquin for CAP coverage given COPD there is increased risk for pseudomonal infection, obtain sputum cx -Currently on IV Solu-Medrol, given n.p.o. we will hold off on oral prednisone -Continue Mucinex, incentive spirometry, flutter valve -Pain control with IV fentanyl  GERD, stable -IV PPI, given n.p.o.  Tobacco use -cessation encouraged admission -Nicotine patch  Hypertension, currently at goal -holding home BP measurement given n.p.o. for now -IV hydralazine as needed  Class III obesity -Strongly encourage lifestyle  changes, with outpatient PCP --recommend outpatient sleep study ( given polycythemia and elevated  CO2)  Extremity edema, resolved.   TTE showed preserved EF.  Likely related to venous stasis given quick resolution and no longer requiring diuresis.  Doubt DVT given resolution -Close monitoring       Family Communication  :  none  Code Status :  FULL  Disposition Plan  :  Patient is from home. Anticipated d/c date: 2 to 3 days. Barriers to d/c or necessity for inpatient status:  Status post tracheostomy after biopsy for laryngeal mass, currently requiring vent support via trach hopefully only need for short period of time Consults  :  ENT  Procedures  :  Barium Swallow  DVT Prophylaxis  :  Lovenox  MDM: The below labs and imaging reports were reviewed and summarized above.  Medication management as above.  Lab Results  Component Value Date   PLT 203 03/14/2020    Diet :  Diet Order            Diet NPO time specified  Diet effective now                  Inpatient Medications Scheduled Meds: . chlorhexidine  15 mL Mouth Rinse BID  . Chlorhexidine Gluconate Cloth  6 each Topical Daily  . dextromethorphan-guaiFENesin  1 tablet Oral BID  . enoxaparin (LOVENOX) injection  55 mg Subcutaneous Q24H  . feeding supplement  237 mL Oral BID BM  . fentaNYL      . fluticasone furoate-vilanterol  1 puff Inhalation Daily  . ipratropium-albuterol  3 mL Nebulization Q6H  . mouth rinse  15 mL Mouth Rinse q12n4p  . methylPREDNISolone (SOLU-MEDROL) injection  40 mg Intravenous Q6H  . multivitamin with minerals  1 tablet Oral Daily  . nicotine  21 mg Transdermal Daily  . pantoprazole (PROTONIX) IV  40 mg Intravenous Q24H   Continuous Infusions: . doxycycline (VIBRAMYCIN) IV 125 mL/hr at 03/14/20 0600  . lactated ringers Stopped (03/14/20 0547)   PRN Meds:.fentaNYL (SUBLIMAZE) injection, ipratropium-albuterol  Antibiotics  :   Anti-infectives (From admission, onward)   Start      Dose/Rate Route Frequency Ordered Stop   03/10/20 1730  doxycycline (VIBRAMYCIN) 100 mg in sodium chloride 0.9 % 250 mL IVPB        100 mg 125 mL/hr over 120 Minutes Intravenous Every 12 hours 03/10/20 1634     03/10/20 1600  azithromycin (ZITHROMAX) 500 mg in sodium chloride 0.9 % 250 mL IVPB  Status:  Discontinued        500 mg 250 mL/hr over 60 Minutes Intravenous Every 24 hours 03/10/20 1529 03/10/20 1538   03/10/20 1500  clindamycin (CLEOCIN) IVPB 600 mg  Status:  Discontinued        600 mg 100 mL/hr over 30 Minutes Intravenous  Once 03/10/20 1458 03/10/20 1521       Objective   Vitals:   03/14/20 0600 03/14/20 0732 03/14/20 0754 03/14/20 0814  BP: 128/70     Pulse: (!) 50 61    Resp: 15     Temp:   97.9 F (36.6 C)   TempSrc:   Oral   SpO2: (!) 88% (!) 89%    Weight:      Height:    5\' 5"  (1.651 m)    SpO2: (!) 89 % O2 Flow Rate (L/min): 15 L/min FiO2 (%): 100 %  Wt Readings from Last 3 Encounters:  03/14/20 111.7 kg  01/24/20 111.8 kg  01/30/19 (!) 136.3 kg     Intake/Output Summary (  Last 24 hours) at 03/14/2020 0852 Last data filed at 03/14/2020 0600 Gross per 24 hour  Intake 1968.23 ml  Output 325 ml  Net 1643.23 ml    Physical Exam:   Awake Alert, Oriented X 3, Normal affect, no distress Tracheostomy with vent support on 100% Fio2 Slightly increased work of breathing with use of abdominal muscles No new F.N deficits,  Veronica Horton.AT, RRR,No Gallops,Rubs or new Murmurs,  +ve B.Sounds, Abd Soft, No tenderness, No rebound, guarding or rigidity. No Cyanosis, No new Rash or bruise     I have personally reviewed the following:   Data Reviewed:  CBC Recent Labs  Lab 03/10/20 1227 03/12/20 0352 03/13/20 0849 03/14/20 0824  WBC 10.1 18.7* 10.2 10.6*  HGB 14.5 15.0 15.0 15.5*  HCT 47.2* 48.4* 47.4* 47.8*  PLT 220 248 208 203  MCV 101.9* 100.6* 97.1 98.0  MCH 31.3 31.2 30.7 31.8  MCHC 30.7 31.0 31.6 32.4  RDW 14.7 14.6 14.5 14.5  LYMPHSABS 1.0  --    --   --   MONOABS 0.7  --   --   --   EOSABS 0.2  --   --   --   BASOSABS 0.0  --   --   --     Chemistries  Recent Labs  Lab 03/10/20 1226 03/10/20 1600 03/12/20 0352 03/13/20 0849  NA 137  --  140 140  K 4.6  --  4.0 4.3  CL 96*  --  95* 98  CO2 34*  --  32 32  GLUCOSE 104*  --  131* 103*  BUN 16  --  33* 37*  CREATININE 1.01*  --  1.10* 1.16*  CALCIUM 8.6*  --  9.7 9.3  MG  --  2.3  --   --   AST 14*  --   --   --   ALT 12  --   --   --   ALKPHOS 79  --   --   --   BILITOT 0.8  --   --   --    ------------------------------------------------------------------------------------------------------------------ No results for input(s): CHOL, HDL, LDLCALC, TRIG, CHOLHDL, LDLDIRECT in the last 72 hours.  No results found for: HGBA1C ------------------------------------------------------------------------------------------------------------------ No results for input(s): TSH, T4TOTAL, T3FREE, THYROIDAB in the last 72 hours.  Invalid input(s): FREET3 ------------------------------------------------------------------------------------------------------------------ No results for input(s): VITAMINB12, FOLATE, FERRITIN, TIBC, IRON, RETICCTPCT in the last 72 hours.  Coagulation profile No results for input(s): INR, PROTIME in the last 168 hours.  No results for input(s): DDIMER in the last 72 hours.  Cardiac Enzymes No results for input(s): CKMB, TROPONINI, MYOGLOBIN in the last 168 hours.  Invalid input(s): CK ------------------------------------------------------------------------------------------------------------------    Component Value Date/Time   BNP 183.0 (H) 03/10/2020 1227    Micro Results Recent Results (from the past 240 hour(s))  Respiratory Panel by RT PCR (Flu A&B, Covid) - Nasopharyngeal Swab     Status: None   Collection Time: 03/10/20 12:26 PM   Specimen: Nasopharyngeal Swab  Result Value Ref Range Status   SARS Coronavirus 2 by RT PCR NEGATIVE  NEGATIVE Final    Comment: (NOTE) SARS-CoV-2 target nucleic acids are NOT DETECTED.  The SARS-CoV-2 RNA is generally detectable in upper respiratoy specimens during the acute phase of infection. The lowest concentration of SARS-CoV-2 viral copies this assay can detect is 131 copies/mL. A negative result does not preclude SARS-Cov-2 infection and should not be used as the sole basis for treatment or other patient management decisions.  A negative result may occur with  improper specimen collection/handling, submission of specimen other than nasopharyngeal swab, presence of viral mutation(s) within the areas targeted by this assay, and inadequate number of viral copies (<131 copies/mL). A negative result must be combined with clinical observations, patient history, and epidemiological information. The expected result is Negative.  Fact Sheet for Patients:  PinkCheek.be  Fact Sheet for Healthcare Providers:  GravelBags.it  This test is no t yet approved or cleared by the [place] FDA and  has been authorized for detection and/or diagnosis of SARS-CoV-2 by FDA under an Emergency Use Authorization (EUA). This EUA will remain  in effect (meaning this test can be used) for the duration of the COVID-19 declaration under Section 564(b)(1) of the Act, 21 U.S.C. section 360bbb-3(b)(1), unless the authorization is terminated or revoked sooner.     Influenza A by PCR NEGATIVE NEGATIVE Final   Influenza B by PCR NEGATIVE NEGATIVE Final    Comment: (NOTE) The Xpert Xpress SARS-CoV-2/FLU/RSV assay is intended as an aid in  the diagnosis of influenza from Nasopharyngeal swab specimens and  should not be used as a sole basis for treatment. Nasal washings and  aspirates are unacceptable for Xpert Xpress SARS-CoV-2/FLU/RSV  testing.  Fact Sheet for Patients: PinkCheek.be  Fact Sheet for Healthcare  Providers: GravelBags.it  This test is not yet approved or cleared by the [place] FDA and  has been authorized for detection and/or diagnosis of SARS-CoV-2 by  FDA under an Emergency Use Authorization (EUA). This EUA will remain  in effect (meaning this test can be used) for the duration of the  Covid-19 declaration under Section 564(b)(1) of the Act, 21  U.S.C. section 360bbb-3(b)(1), unless the authorization is  terminated or revoked. Performed at [facility], [address], [place], [place] [address]   MRSA PCR Screening     Status: None   Collection Time: 03/10/20  5:08 PM   Specimen: Nasopharyngeal  Result Value Ref Range Status   MRSA by PCR NEGATIVE NEGATIVE Final    Comment:        The GeneXpert MRSA Assay (FDA approved for NASAL specimens only), is one component of a comprehensive MRSA colonization surveillance program. It is not intended to diagnose MRSA infection nor to guide or monitor treatment for MRSA infections. Performed at [facility], [address] [place], [place] [address]     Radiology Reports CT SOFT TISSUE NECK W CONTRAST  Addendum Date: 03/11/2020   ADDENDUM REPORT: 03/11/2020 16:36 ADDENDUM: Study discussed by telephone with Dr. Jolaine Artist MEMON on 03/11/2020 at 1631 hours. Electronically Signed   By: Genevie Ann M.D.   On: 03/11/2020 16:36   Result Date: 03/11/2020 CLINICAL DATA:  59 year old female with dysphagia. Tonsillitis suspected. EXAM: CT NECK WITH CONTRAST TECHNIQUE: Multidetector CT imaging of the neck was performed using the standard protocol following the bolus administration of intravenous contrast. CONTRAST:  2mL OMNIPAQUE IOHEXOL 300 MG/ML  SOLN COMPARISON:  Chest CT 01/28/2019. FINDINGS: Pharynx and larynx: Bulky soft tissue mass throughout the bilateral supraglottic larynx and affecting the hypopharynx. Lobulated diffuse thickening of the epiglottis (series 2, image 53). Diffuse false cord and anterior  commissure involvement with evidence of extension into the right paraglottic space on series 2 image 61 - where nodular direct extension of tumor and/or inseparable abnormal right level 3 lymph node protrudes on coronal image 58. Asymmetric erosion of the undersurface of the right thyroid cartilage on image 65. Asymmetric sclerosis of the right arytenoid on image  22., and midline extension of tumor is suspected through the anterior commissure a distance of about 11 mm as seen on series 2, image 67 and sagittal image 48. All told, tumor size is estimated at 37 x 52 by 45 mm (AP by transverse by CC). There is evidence of airway compromise. The true cords may be spared as seen on series 2, image 71. Subglottic larynx is within normal limits. Above the vallecula pharyngeal contours appear normal. Normal superior parapharyngeal and retropharyngeal spaces. Salivary glands: Negative sublingual space. Submandibular glands and parotid glands remain within normal limits. Thyroid: Negative. Lymph nodes: Malignant left level IIIb lymph node measures 17 mm short axis and 29 mm long axis (series 2, image 55 and coronal image 73). As stated above it is possible of malignant right level IIIa lymph node is inseparable from the parent tumor along the right paraglottic space (coronal image 58). Smaller but asymmetrically enlarged left level 4 nodes measure up to 9 mm short axis (coronal image 72). No other abnormal or suspicious lymph nodes identified. Vascular: Major vascular structures in the neck and at the skull base remain patent including both internal jugular veins. The left vertebral artery appears dominant. Calcified atherosclerosis at the skull base. Limited intracranial: Negative. Visualized orbits: Negative. Mastoids and visualized paranasal sinuses: Clear. Skeleton: Absent and carious dentition. Cervical spine degeneration. No suspicious osseous lesion identified. Upper chest: Aberrant origin right subclavian artery  (normal variant). No superior mediastinal lymphadenopathy. Visible axillary lymph nodes are normal. Mild dependent atelectasis.  No upper lung nodule identified. IMPRESSION: 1. 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 Recommend ENT consultation. Involvement of right laryngeal cartilages, with extension in the midline anterior to the strap muscles, and also extension through the right paraglottic space and/or inseparable malignant right level IIIa lymph node. Estimated tumor long axis 5.2 cm. 2. Malignant contralateral left level 3b lymph node is 2.9 cm long axis. Smaller indeterminate left level 4 lymph nodes. 3. No distant metastatic disease identified in the neck or upper chest. Electronically Signed: By: Genevie Ann M.D. On: 03/11/2020 16:23   DG Chest Portable 1 View  Result Date: 03/10/2020 CLINICAL DATA:  Shortness of breath and cough EXAM: PORTABLE CHEST 1 VIEW COMPARISON:  January 23, 2020 FINDINGS: There is bibasilar atelectatic change. Elsewhere the interstitium is mildly thickened. No consolidation. Heart size and pulmonary vascularity are normal. No adenopathy. There is degenerative change in each shoulder. IMPRESSION: Bibasilar atelectasis. Interstitial thickening likely represents a degree of underlying chronic bronchitis. No edema or airspace opacity. Heart size within normal limits. Electronically Signed   By: Lowella Grip III M.D.   On: 03/10/2020 12:42   DG Swallowing Func-Speech Pathology  Result Date: 03/11/2020 Objective Swallowing Evaluation: Type of Study: MBS-Modified Barium Swallow Study  Patient Details Name: TZIREL LEONOR MRN: [id number] Date of Birth: 1960-11-18 Today's Date: 03/11/2020 Time: SLP Start Time (ACUTE ONLY): 0938 -SLP Stop Time (ACUTE ONLY): 1316 SLP Time Calculation (min) (ACUTE ONLY): 21 min Past Medical History: Past Medical History: Diagnosis Date . Bronchitis  . Class 3 obesity 01/23/2020 . COPD (chronic obstructive pulmonary  disease) ([place])  . Degenerative disc disease, lumbar  . Hiatal hernia  . Hypertension  Past Surgical History: Past Surgical History: Procedure Laterality Date . CHOLECYSTECTOMY   . degenerative bone disease   . INCISIONAL HERNIA REPAIR N/A 01/15/2019  Procedure: [name] HERNIORRHAPHY WITH MESH;  Surgeon: [name], MD;  Location: [facility];  Service: General;  Laterality: N/A; . OMENTECTOMY N/A 01/15/2019  Procedure: OMENTECTOMY;  Surgeon: [name], MD;  Location: [facility];  Service: General;  Laterality: N/A; HPI: Rylen Hou  is a 59 y.o. female, with medical history significant of class III obesity, hiatal hernia, hypertension, bronchitis, COPD, chronic respiratory failure on home oxygen at 5 LPM, active smoker of 1-1/2 packs of cigarettes per day , patient presents to ED secondary to shortness of breath and altered mental status, patient with receptive worsening dyspnea over the last 4 days, she is not vaccinated against Covid, she is altered at the time of my examination, history was obtained from boyfriend at bedside, and ED staff, patient still smoking, she still using 5 L of oxygen, he is with increased work of breathing, significantly dyspneic, upon presentation to ED she is altered not provide any complaints at this point. BSE requested.  Subjective: "I haven't been able to swallow my pills for a couple weeks." Assessment / Plan / Recommendation CHL IP CLINICAL IMPRESSIONS 03/11/2020 Clinical Impression Pt presents with mild pharyngeal phase dysphagia, however appearance of anatomy appears edematous (epiglottis and aryepiglottic folds). Pt with limited dentition and requires extra time to masticate solids, swallow trigger generally at the level of the valleculae, Pt with blunted appearance of epiglottis with reduced deflection resulting in variable trace, flash penetration of thins during the swallow without aspiration and min vallecular residue with solids and brief stasis of barium tablet in  valleculae. Pt with prominent cricopharyngeus. Recommend D3/mech soft and thin liquids, po medications whole in puree and follow with liquid wash or per Pt preference. Also strongly recommend additional imaging (neck CT) and ENT consult pending those results. SLP will follow pending results of imaging. Above to RN and MD.  SLP Visit Diagnosis Dysphagia, oropharyngeal phase (R13.12) Attention and concentration deficit following -- Frontal lobe and executive function deficit following -- Impact on safety and function Mild aspiration risk   CHL IP TREATMENT RECOMMENDATION 03/11/2020 Treatment Recommendations No treatment recommended at this time   Prognosis 03/11/2020 Prognosis for Safe Diet Advancement Fair Barriers to Reach Goals Severity of deficits Barriers/Prognosis Comment -- CHL IP DIET RECOMMENDATION 03/11/2020 SLP Diet Recommendations Dysphagia 3 (Mech soft) solids;Thin liquid Liquid Administration via Cup;Straw Medication Administration Whole meds with puree Compensations Slow rate;Small sips/bites Postural Changes Remain semi-upright after after feeds/meals (Comment);Seated upright at 90 degrees   CHL IP OTHER RECOMMENDATIONS 03/11/2020 Recommended Consults Consider ENT evaluation Oral Care Recommendations Oral care BID Other Recommendations Clarify dietary restrictions   CHL IP FOLLOW UP RECOMMENDATIONS 03/11/2020 Follow up Recommendations None   CHL IP FREQUENCY AND DURATION 03/11/2020 Speech Therapy Frequency (ACUTE ONLY) min 2x/week Treatment Duration 1 week      CHL IP ORAL PHASE 03/11/2020 Oral Phase WFL Oral - Pudding Teaspoon -- Oral - Pudding Cup -- Oral - Honey Teaspoon -- Oral - Honey Cup -- Oral - Nectar Teaspoon -- Oral - Nectar Cup -- Oral - Nectar Straw -- Oral - Thin Teaspoon -- Oral - Thin Cup -- Oral - Thin Straw -- Oral - Puree -- Oral - Mech Soft -- Oral - Regular -- Oral - Multi-Consistency -- Oral - Pill -- Oral Phase - Comment --  CHL IP PHARYNGEAL PHASE 03/11/2020 Pharyngeal Phase  Impaired Pharyngeal- Pudding Teaspoon -- Pharyngeal -- Pharyngeal- Pudding Cup -- Pharyngeal -- Pharyngeal- Honey Teaspoon -- Pharyngeal -- Pharyngeal- Honey Cup -- Pharyngeal -- Pharyngeal- Nectar Teaspoon -- Pharyngeal -- Pharyngeal- Nectar Cup -- Pharyngeal -- Pharyngeal- Nectar Straw Pharyngeal residue - valleculae;Reduced epiglottic inversion Pharyngeal -- Pharyngeal- Thin Teaspoon Delayed swallow  initiation-vallecula;Reduced epiglottic inversion Pharyngeal -- Pharyngeal- Thin Cup Reduced epiglottic inversion;Penetration/Aspiration during swallow Pharyngeal Material enters airway, remains ABOVE vocal cords then ejected out Pharyngeal- Thin Straw Reduced epiglottic inversion;Penetration/Aspiration during swallow Pharyngeal Material enters airway, remains ABOVE vocal cords then ejected out Pharyngeal- Puree WFL Pharyngeal -- Pharyngeal- Mechanical Soft -- Pharyngeal -- Pharyngeal- Regular Pharyngeal residue - valleculae;Reduced epiglottic inversion Pharyngeal -- Pharyngeal- Multi-consistency -- Pharyngeal -- Pharyngeal- Pill Pharyngeal residue - valleculae;Reduced epiglottic inversion Pharyngeal -- Pharyngeal Comment edematous pharynx  CHL IP CERVICAL ESOPHAGEAL PHASE 03/11/2020 Cervical Esophageal Phase Impaired Pudding Teaspoon -- Pudding Cup -- Honey Teaspoon -- Honey Cup -- Nectar Teaspoon -- Nectar Cup -- Nectar Straw -- Thin Teaspoon -- Thin Cup Prominent cricopharyngeal segment Thin Straw -- Puree -- Mechanical Soft -- Regular -- Multi-consistency -- Pill -- Cervical Esophageal Comment -- Thank you, [name], [place] 03/11/2020, 2:12 PM              ECHOCARDIOGRAM COMPLETE  Result Date: 03/11/2020    ECHOCARDIOGRAM REPORT   Patient Name:   RUHAMA LEHEW Date of Exam: 03/11/2020 Medical Rec #:  [id number]       Height:       65.0 in Accession #:    [id number]      Weight:       244.0 lb Date of Birth:  1960/08/14       BSA:          2.153 m Patient Age:    19 years         BP:           115/35 mmHg Patient Gender: F               HR:           57 bpm. Exam Location:  [facility] Procedure: 2D Echo Indications:    Dyspnea 786.09 / R06.00  History:        Patient has prior history of Echocardiogram examinations, most                 recent 01/16/2019. COPD; Risk Factors:Hypertension and Current                 Smoker. Acute respiratory failure with hypoxia.  Sonographer:    Leavy Cella RDCS (AE) Referring Phys: [id number] [address] [place] IMPRESSIONS  1. Left ventricular ejection fraction, by estimation, is 70 to 75%. The left ventricle has hyperdynamic function. The left ventricle has no regional wall motion abnormalities. There is mild left ventricular hypertrophy. Left ventricular diastolic parameters were normal.  2. Right ventricular systolic function is normal. The right ventricular size is normal. Tricuspid regurgitation signal is inadequate for assessing PA pressure.  3. The mitral valve is grossly normal. Trivial mitral valve regurgitation.  4. The aortic valve is tricuspid. Aortic valve regurgitation is not visualized.  5. The inferior vena cava is normal in size with greater than 50% respiratory variability, suggesting right atrial pressure of 3 mmHg. FINDINGS  Left Ventricle: Left ventricular ejection fraction, by estimation, is 70 to 75%. The left ventricle has hyperdynamic function. The left ventricle has no regional wall motion abnormalities. The left ventricular internal cavity size was normal in size. There is mild left ventricular hypertrophy. Left ventricular diastolic parameters were normal. Right Ventricle: The right ventricular size is normal. No increase in right ventricular wall thickness. Right ventricular systolic function is normal. Tricuspid regurgitation signal is inadequate for assessing PA pressure. Left Atrium: Left atrial size was  normal in size. Right Atrium: Right atrial size was normal in size. Pericardium: There is no evidence of pericardial  effusion. Mitral Valve: The mitral valve is grossly normal. Trivial mitral valve regurgitation. Tricuspid Valve: The tricuspid valve is grossly normal. Tricuspid valve regurgitation is trivial. Aortic Valve: The aortic valve is tricuspid. Aortic valve regurgitation is not visualized. Pulmonic Valve: The pulmonic valve was not well visualized. Pulmonic valve regurgitation is not visualized. Aorta: The aortic root is normal in size and structure. Venous: The inferior vena cava is normal in size with greater than 50% respiratory variability, suggesting right atrial pressure of 3 mmHg. IAS/Shunts: No atrial level shunt detected by color flow Doppler.  LEFT VENTRICLE PLAX 2D LVIDd:         4.05 cm  Diastology LVIDs:         2.30 cm  LV e' medial:    8.59 cm/s LV PW:         1.35 cm  LV E/e' medial:  12.2 LV IVS:        1.34 cm  LV e' lateral:   11.00 cm/s LVOT diam:     2.00 cm  LV E/e' lateral: 9.5 LVOT Area:     3.14 cm  RIGHT VENTRICLE RV S prime:     15.40 cm/s TAPSE (M-mode): 2.9 cm LEFT ATRIUM             Index       RIGHT ATRIUM           Index LA diam:        4.70 cm 2.18 cm/m  RA Area:     13.50 cm LA Vol (A2C):   73.3 ml 34.05 ml/m RA Volume:   35.40 ml  16.44 ml/m LA Vol (A4C):   39.3 ml 18.26 ml/m LA Biplane Vol: 55.6 ml 25.83 ml/m   AORTA Ao Root diam: 2.70 cm MITRAL VALVE MV Area (PHT): 2.69 cm     SHUNTS MV Decel Time: 282 msec     Systemic Diam: 2.00 cm MV E velocity: 105.00 cm/s MV A velocity: 57.80 cm/s MV E/A ratio:  1.82 Rozann Lesches MD Electronically signed by Rozann Lesches MD Signature Date/Time: 03/11/2020/4:59:31 PM    Final      Time Spent in minutes  30     Desiree Hane M.D on 03/14/2020 at 8:52 AM  To page go to [email or web address] - password [email or web address]

## 2020-03-14 NOTE — Evaluation (Signed)
Physical Therapy RE- Evaluation Patient Details Name: Veronica Horton MRN: [id number] DOB: 02/28/1961 Today's Date: 03/14/2020   History of Present Illness  Ms. Stimmel is a 59 year old female with medical history significant for COPD with chronic hypoxic respiratory failure on 5 L, HTN, tobacco use, obesity class 3 who presented on 11/10 with worsening shortness of breath over the past 4 days and increased confusion. Pt found to be in COPD exacerbation, hospital course at [facility] complicated by dysphasia, pt found to have a laryngeal mass. Pt transferred to [facility]. Pt underwent trach placement on 11/13 s/p layrngeal mass biopsy, went on vent 11/14 in AM due to desaturation..    Clinical Impression  Pt transferred from [facility], pt now with trach and on vent. Pt very motivated to move and was able to stand at bedside today despite excessive coughing and RN suctioning patient x2. Pt writing to communicate but also mouthing words. Anticipate once patient is weaned off vent pt with be able to mobilize with minimal assess however acute PT will cont to follow and re-assess d/c recommendations.    Follow Up Recommendations Home health PT;Supervision/Assistance - 24 hour    Equipment Recommendations  None recommended by PT    Recommendations for Other Services       Precautions / Restrictions Precautions Precautions: Fall Restrictions Weight Bearing Restrictions: No      Mobility  Bed Mobility Overal bed mobility: Needs Assistance Bed Mobility: Supine to Sit     Supine to sit: Supervision     General bed mobility comments: HOB elevated, pt brought self to EOB, supervision for line management, verbal cues for safety, increased time    Transfers Overall transfer level: Needs assistance Equipment used: 2 person hand held assist Transfers: Sit to/from Stand Sit to Stand: Min assist;+2 safety/equipment         General transfer comment: minA to power up and manage lines and vent,  pt attempted to side step to [facility] pt requiring assist to move R LE.   Ambulation/Gait             General Gait Details: unable due to trach/vent  Stairs            Wheelchair Mobility    Modified Rankin (Stroke Patients Only)       Balance Overall balance assessment: Needs assistance Sitting-balance support: Feet unsupported;No upper extremity supported Sitting balance-Leahy Scale: Good Sitting balance - Comments: pt with excessive coughing during EOB sitting, RN present and aware, suctioned pt   Standing balance support: Bilateral upper extremity supported;During functional activity Standing balance-Leahy Scale: Poor Standing balance comment: dependent on bilat UE support                             Pertinent Vitals/Pain Pain Assessment: No/denies pain    Home Living Family/patient expects to be discharged to:: Private residence Living Arrangements: Spouse/significant other Available Help at Discharge: Friend(s);Available 24 hours/day (spouse and sister) Type of Home: Mobile home Home Access: Ramped entrance     Home Layout: One level Home Equipment: Walker - 2 wheels;Cane - single point      Prior Function Level of Independence: Independent with assistive device(s)         Comments: used cane and RW to aide in amb, pt doesn't work     Hand Dominance   Dominant Hand: Right    Extremity/Trunk Assessment   Upper Extremity Assessment Upper Extremity Assessment: Overall [facility]  for tasks assessed    Lower Extremity Assessment Lower Extremity Assessment: Overall WFL for tasks assessed    Cervical / Trunk Assessment Cervical / Trunk Assessment: Normal  Communication   Communication: Tracheostomy (writes on paper and mouths words)  Cognition Arousal/Alertness: Awake/alert Behavior During Therapy: WFL for tasks assessed/performed Overall Cognitive Status: Within Functional Limits for tasks assessed                                         General Comments General comments (skin integrity, edema, etc.): pt with bilat LE discoloration, SPO2 >95% on vent, pt with lots of coughing spells, RN states "shes just getting used to the trach"    Exercises     Assessment/Plan    PT Assessment Patient needs continued PT services  PT Problem List Decreased strength;Decreased activity tolerance;Decreased balance;Decreased mobility       PT Treatment Interventions DME instruction;Gait training;Stair training;Functional mobility training;Therapeutic activities;Therapeutic exercise;Patient/family education;Balance training    PT Goals (Current goals can be found in the Care Plan section)  Acute Rehab PT Goals Patient Stated Goal: didn't state PT Goal Formulation: With patient Time For Goal Achievement: 03/28/20 Potential to Achieve Goals: Good    Frequency Min 3X/week   Barriers to discharge        Co-evaluation               AM-PAC PT "6 Clicks" Mobility  Outcome Measure Help needed turning from your back to your side while in a flat bed without using bedrails?: None Help needed moving from lying on your back to sitting on the side of a flat bed without using bedrails?: None Help needed moving to and from a bed to a chair (including a wheelchair)?: A Little Help needed standing up from a chair using your arms (e.g., wheelchair or bedside chair)?: A Little Help needed to walk in hospital room?: A Lot Help needed climbing 3-5 steps with a railing? : A Lot 6 Click Score: 18    End of Session Equipment Utilized During Treatment: Oxygen Activity Tolerance: Patient tolerated treatment well;Patient limited by fatigue Patient left: in bed;with bed alarm set Nurse Communication: Mobility status PT Visit Diagnosis: Unsteadiness on feet (R26.81);Other abnormalities of gait and mobility (R26.89);Muscle weakness (generalized) (M62.81)    Time: 9735-3299 PT Time Calculation (min) (ACUTE ONLY): 27  min   Charges:   PT Evaluation $PT Re-evaluation: 1 Re-eval PT Treatments $Therapeutic Activity: 8-22 mins        Kittie Plater, PT, DPT Acute Rehabilitation Services Pager #: [phone] Office #: [phone]   Berline Lopes 03/14/2020, 2:20 PM

## 2020-03-14 NOTE — Progress Notes (Addendum)
ENT PROGRESS NOTE   Subjective: Patient seen and examined at bedside. Patient was placed on vent support this morning due to desaturation. She is resting comfortably now, awake and alert, able to answer questions with head movements and gestures.  She reports pain is controlled.    Objective: Vital signs in last 24 hours: Temp:  [97.5 F (36.4 C)-98.5 F (36.9 C)] 98 F (36.7 C) (11/14 1133) Pulse Rate:  [45-101] 51 (11/14 1100) Resp:  [10-23] 14 (11/14 1100) BP: (120-170)/(52-81) 121/55 (11/14 1100) SpO2:  [82 %-98 %] 94 % (11/14 1100) FiO2 (%):  [60 %-100 %] 60 % (11/14 1041) Weight:  [111.7 kg] 111.7 kg (11/14 0500)  Physical Exam: Constitutional: Obese female, resting comfortably, on vent support, no acute distress HENT: Head : normocephalic and atraumatic Mouth/Throat: Very poor dentition with several broken and missing teeth, remaining dentition with active decay.  Floor mouth soft.  No evidence of mucosal lesions, uvula midline.  Neck:  Shiley tracheostomy secured with sutures and Velcro ties, patent, no significant bleeding around tracheostomy Pulmonary:  On vent support  @LABLAST2 (wbc:2,hgb:2,hct:2,plt:2) Recent Labs    03/13/20 0849 03/14/20 0824  NA 140 141  K 4.3 4.5  CL 98 101  CO2 32 28  GLUCOSE 103* 112*  BUN 37* 39*  CREATININE 1.16* 1.10*  CALCIUM 9.3 8.9    Medications: I have reviewed the patient's current medications.  New Imaging: CXR 11/14 IMPRESSION: Tracheostomy as described without pneumothorax. Patchy airspace opacity consistent with scattered areas of pneumonia in the left upper lobe and left base. Equivocal left pleural effusion. Right lung clear. Stable cardiac silhouette.  Pathology: Final pathology pending  Assessment/Plan: Veronica Horton is a 59 y/o F with history of COPD,chronic respiratory failure on home oxygen at 5 L/min,with large laryngeal mass lesion, now POD #1 s/p tracheostomy with Shiley 6-0 and direct laryngoscopy and  biopsy. 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 Patient is clinically staged [id number].  - Continue ICU monitoring, appreciate critical care assistance. Please note patient is NOT intubatable from above, please keep sign at head of bed stating this.  - Tape obturator to head of bed - Keep extra 6-0 and 4-0 shiley trach at nursing station - Wean vent as tolerated - Routine trach care, ok deflate trach cuff if not requiring vent support - Continue speech and swallowing therapy once cuff deflated - Do not cut trach sutures until POD #5 - Follow up final pathology results - Blood-tinged sputum to be expected in postoperative state, moderate bleeding from tumor was noted intraoperatively; patient on Lovenox for DVT prophylaxis - Recommend oncology, radiation oncology consultation - Patient will need PET/CT for staging, will defer timing of imaging to Oncology  - Recommend dental evaluation for extraction of remaining dentition - Recommend smoking cessation counseling - Recommend CorPak placement to provide nutrition, patient will likely require consideration for PEG tube placement pending treatment course - Medical management as per primary team   LOS: 4 days  Thank you for allowing me to participate in the care of this patient. Please do not hesitate to contact me with any questions or concerns.   Jason Coop, [place] Cell: [phone] 03/14/2020, 11:58 AM

## 2020-03-14 NOTE — Progress Notes (Signed)
Patient in distress  Desaturated, suctioning has not really helped  Fresh tracheostomy  Ordered for patient to be placed on a ventilator Stat breathing treatment Stat chest x-ray Obtain ABG   Nurse will let ENT know about events

## 2020-03-14 NOTE — Progress Notes (Signed)
Entered patient's room to find her desaturating to 82% on 80% trach collar.  Pt repsonds to voice appropriately and is able to suction her mouth with [place].  Trach tube suctioned for moderate amount of blood-tinged secretions with no improvements in saturations.  Asked Dr. Ander Slade to see the patient until we could get in touch with the attending physician.  Pt was placed on the ventilator in PRVC mode and stat duoneb treatment given inline.  O2 saturations improved to 89-91%.  Stat chest x ray ordered and we will obtain ABG later this morning.

## 2020-03-14 NOTE — Consult Note (Signed)
Veronica Horton  Telephone:([phone]) [place]  DOB: [birth year]  MR#: [id number]  CSN#: [id number]    Requesting Physician: [facility]  Patient Care Team: [facility] as PCP - General  Reason for consult: Newly diagnosed laryngeal cancer  History of present illness: Patient is a 59 year old Caucasian female, with past medical history of COPD, on home oxygen 5 L/min continuously, hypertension, presented with worsening dyspnea to any pain hospital on March 10, 2020.  She was initially admitted for acute exacerbation of COPD and placed on BiPAP, speech evaluation showed erythematous supraglottis and mild pharyngeal phase dysphagia.  CT neck was sent which showed a large soft tissue mass in the supraglottis with narrowing of the trachea.  Patient was transferred to [facility] for further evaluation, and underwent urgent tracheostomy yesterday.  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  Final surgical path is not back yet.  Patient is awake, able to communicate briefly with writing.  Due to desaturation, he was put on ventilation last night. No fever or chills.  MEDICAL HISTORY:  Past Medical History:  Diagnosis Date  . Bronchitis   . Class 3 obesity 01/23/2020  . COPD (chronic obstructive pulmonary disease) ([place])   . Degenerative disc disease, lumbar   . Hiatal hernia   . Hypertension     SURGICAL HISTORY: Past Surgical History:  Procedure Laterality Date  . CHOLECYSTECTOMY    . degenerative bone disease    . DIRECT LARYNGOSCOPY N/A 03/13/2020   Procedure: DIRECT LARYNGOSCOPY WITH BIOPSY;  Surgeon: Jason Coop, DO;  Location: [facility];  Service: ENT;  Laterality: N/A;  . INCISIONAL HERNIA REPAIR N/A 01/15/2019   Procedure: [name] HERNIORRHAPHY WITH MESH;  Surgeon: [name], MD;  Location: [facility];   Service: General;  Laterality: N/A;  . OMENTECTOMY N/A 01/15/2019   Procedure: OMENTECTOMY;  Surgeon: [name], MD;  Location: [facility];  Service: General;  Laterality: N/A;  . TRACHEOSTOMY TUBE PLACEMENT N/A 03/13/2020   Procedure: AWAKE TRACHEOSTOMY;  Surgeon: Jason Coop, DO;  Location: [facility];  Service: ENT;  Laterality: N/A;    SOCIAL HISTORY: Social History   Socioeconomic History  . Marital status: Divorced    Spouse name: Not on file  . Number of children: Not on file  . Years of education: Not on file  . Highest education level: Not on file  Occupational History  . Not on file  Tobacco Use  . Smoking status: Current Every Day Smoker    Packs/day: 1.00  . Smokeless tobacco: Never Used  Substance and Sexual Activity  . Alcohol use: No  . Drug use: No  . Sexual activity: Yes    Birth control/protection: Post-menopausal  Other Topics Concern  . Not on file  Social History Narrative  . Not on file   Social Determinants of Health   Financial Resource Strain:   . Difficulty of Paying Living Expenses: Not on file  Food Insecurity:   . Worried About Charity fundraiser in the Last Year: Not on file  . Ran Out of Food in the Last Year: Not on file  Transportation Needs:   . Lack of Transportation (Medical): Not on file  . Lack of Transportation (Non-Medical): Not on file  Physical Activity:   . Days of Exercise per Week: Not on file  . Minutes of Exercise per Session:  Not on file  Stress:   . Feeling of Stress : Not on file  Social Connections:   . Frequency of Communication with Friends and Family: Not on file  . Frequency of Social Gatherings with Friends and Family: Not on file  . Attends Religious Services: Not on file  . Active Member of Clubs or Organizations: Not on file  . Attends Archivist Meetings: Not on file  . Marital Status: Not on file  Intimate Partner Violence:   . Fear of Current or Ex-Partner: Not on file  . Emotionally  Abused: Not on file  . Physically Abused: Not on file  . Sexually Abused: Not on file    FAMILY HISTORY: Family History  Problem Relation Age of Onset  . Hypertension Mother   . Sudden Cardiac Death Neg Hx     ALLERGIES:  is allergic to codeine and penicillins.  MEDICATIONS:  Current Facility-Administered Medications  Medication Dose Route Frequency Provider Last Rate Last Admin  . chlorhexidine (PERIDEX) 0.12 % solution 15 mL  15 mL Mouth Rinse BID Oretha Milch D, MD   15 mL at 03/14/20 1000  . chlorhexidine gluconate (MEDLINE KIT) (PERIDEX) 0.12 % solution 15 mL  15 mL Mouth Rinse BID Oretha Milch D, MD      . Chlorhexidine Gluconate Cloth 2 % PADS 6 each  6 each Topical Daily Elgergawy, Silver Huguenin, MD   6 each at 03/14/20 0932  . dextromethorphan-guaiFENesin (MUCINEX DM) 30-600 MG per 12 hr tablet 1 tablet  1 tablet Oral BID Kathie Dike, MD   1 tablet at 03/11/20 1158  . enoxaparin (LOVENOX) injection 55 mg  55 mg Subcutaneous Q24H Elgergawy, Silver Huguenin, MD   55 mg at 03/14/20 1706  . feeding supplement (ENSURE ENLIVE / ENSURE PLUS) liquid 237 mL  237 mL Oral BID BM Memon, Jolaine Artist, MD      . fentaNYL (SUBLIMAZE) injection 25 mcg  25 mcg Intravenous Q2H PRN Oretha Milch D, MD   25 mcg at 03/14/20 0847  . fluticasone furoate-vilanterol (BREO ELLIPTA) 200-25 MCG/INH 1 puff  1 puff Inhalation Daily Elgergawy, Silver Huguenin, MD   1 puff at 03/13/20 0857  . ipratropium-albuterol (DUONEB) 0.5-2.5 (3) MG/3ML nebulizer solution 3 mL  3 mL Nebulization Q6H PRN Kathie Dike, MD   3 mL at 03/14/20 0817  . ipratropium-albuterol (DUONEB) 0.5-2.5 (3) MG/3ML nebulizer solution 3 mL  3 mL Nebulization Q6H Olalere, Adewale A, MD   3 mL at 03/14/20 1252  . lactated ringers infusion   Intravenous Continuous Lovey Newcomer T, NP 50 mL/hr at 03/14/20 1700 Rate Verify at 03/14/20 1700  . levofloxacin (LEVAQUIN) IVPB 750 mg  750 mg Intravenous Daily Desiree Hane, MD   Stopped at 03/14/20 1339  .  MEDLINE mouth rinse  15 mL Mouth Rinse 10 times per day Oretha Milch D, MD   15 mL at 03/14/20 1600  . methylPREDNISolone sodium succinate (SOLU-MEDROL) 40 mg/mL injection 40 mg  40 mg Intravenous Q6H Elgergawy, Silver Huguenin, MD   40 mg at 03/14/20 1706  . multivitamin with minerals tablet 1 tablet  1 tablet Oral Daily Memon, Jolaine Artist, MD      . nicotine (NICODERM CQ - dosed in mg/24 hours) patch 21 mg  21 mg Transdermal Daily Kathie Dike, MD   21 mg at 03/14/20 1025  . pantoprazole (PROTONIX) injection 40 mg  40 mg Intravenous Q24H Elgergawy, Silver Huguenin, MD   40 mg at 03/14/20 1434  REVIEW OF SYSTEMS:   Constitutional: Denies fevers, chills or abnormal night sweats. (+) fatigue  Eyes: Denies blurriness of vision, double vision or watery eyes Ears, nose, mouth, throat, and face: Denies mucositis or sore throat Respiratory: (+) Worsening dyspnea Cardiovascular: Denies palpitation, chest discomfort or lower extremity swelling Gastrointestinal:  Denies nausea, heartburn or change in bowel habits Skin: Denies abnormal skin rashes Lymphatics: Denies new lymphadenopathy or easy bruising Neurological:Denies numbness, tingling or new weaknesses Behavioral/Psych: Mood is stable, no new changes  All other systems were reviewed with the patient and are negative.  PHYSICAL EXAMINATION: ECOG PERFORMANCE STATUS: 3 - Symptomatic, >50% confined to bed  Vitals:   03/14/20 1530 03/14/20 1700  BP:  (!) 124/58  Pulse:  (!) 59  Resp:  16  Temp: 97.8 F (36.6 C)   SpO2:  95%   Filed Weights   03/10/20 1205 03/10/20 1645 03/14/20 0500  Weight: 246 lb (111.6 kg) 244 lb 0.8 oz (110.7 kg) 246 lb 4.1 oz (111.7 kg)    GENERAL:alert, no distress and comfortable SKIN: skin color, texture, turgor are normal, no rashes or significant lesions EYES: normal, conjunctiva are pink and non-injected, sclera clear OROPHARYNX:no exudate, no erythema and lips, buccal mucosa, and tongue normal  NECK: supple, thyroid  normal size, non-tender, without nodularity LYMPH:  no palpable lymphadenopathy in the cervical, axillary or inguinal LUNGS: clear to auscultation and percussion with normal breathing effort HEART: regular rate & rhythm and no murmurs and no lower extremity edema ABDOMEN:abdomen soft, non-tender and normal bowel sounds Musculoskeletal:no cyanosis of digits and no clubbing  PSYCH: alert & oriented x 3 with fluent speech NEURO: no focal motor/sensory deficits  LABORATORY DATA:  I have reviewed the data as listed Lab Results  Component Value Date   WBC 10.6 (H) 03/14/2020   HGB 15.5 (H) 03/14/2020   HCT 47.8 (H) 03/14/2020   MCV 98.0 03/14/2020   PLT 203 03/14/2020   Recent Labs    01/23/20 0234 01/23/20 0234 01/24/20 0425 01/24/20 0425 03/10/20 1226 03/10/20 1226 03/12/20 0352 03/13/20 0849 03/14/20 0824  NA 138   < > 137   < > 137   < > 140 140 141  K 4.0   < > 4.0   < > 4.6   < > 4.0 4.3 4.5  CL 105   < > 92*   < > 96*   < > 95* 98 101  CO2 24   < > 33*   < > 34*   < > 32 32 28  GLUCOSE 86   < > 135*   < > 104*   < > 131* 103* 112*  BUN 13   < > 23*   < > 16   < > 33* 37* 39*  CREATININE 0.92   < > 0.94   < > 1.01*   < > 1.10* 1.16* 1.10*  CALCIUM 9.0   < > 9.3   < > 8.6*   < > 9.7 9.3 8.9  GFRNONAA >60   < > >60  --  >60   < > 58* 54* 58*  GFRAA >60  --  >60  --   --   --   --   --   --   PROT 7.6  --  7.5  --  7.3  --   --   --   --   ALBUMIN 3.8  --  3.3*  --  3.6  --   --   --   --  AST 32  --  18  --  14*  --   --   --   --   ALT 34  --  14  --  12  --   --   --   --   ALKPHOS 46  --  71  --  79  --   --   --   --   BILITOT 0.5  --  0.7  --  0.8  --   --   --   --    < > = values in this interval not displayed.    RADIOGRAPHIC STUDIES: I have personally reviewed the radiological images as listed and agreed with the findings in the report. CT SOFT TISSUE NECK W CONTRAST  Addendum Date: 03/11/2020   ADDENDUM REPORT: 03/11/2020 16:36 ADDENDUM: Study discussed  by telephone with Dr. Jolaine Artist MEMON on 03/11/2020 at 1631 hours. Electronically Signed   By: Genevie Ann M.D.   On: 03/11/2020 16:36   Result Date: 03/11/2020 CLINICAL DATA:  59 year old female with dysphagia. Tonsillitis suspected. EXAM: CT NECK WITH CONTRAST TECHNIQUE: Multidetector CT imaging of the neck was performed using the standard protocol following the bolus administration of intravenous contrast. CONTRAST:  94mL OMNIPAQUE IOHEXOL 300 MG/ML  SOLN COMPARISON:  Chest CT 01/28/2019. FINDINGS: Pharynx and larynx: Bulky soft tissue mass throughout the bilateral supraglottic larynx and affecting the hypopharynx. Lobulated diffuse thickening of the epiglottis (series 2, image 53). Diffuse false cord and anterior commissure involvement with evidence of extension into the right paraglottic space on series 2 image 61 - where nodular direct extension of tumor and/or inseparable abnormal right level 3 lymph node protrudes on coronal image 58. Asymmetric erosion of the undersurface of the right thyroid cartilage on image 65. Asymmetric sclerosis of the right arytenoid on image 67., and midline extension of tumor is suspected through the anterior commissure a distance of about 11 mm as seen on series 2, image 67 and sagittal image 48. All told, tumor size is estimated at 37 x 52 by 45 mm (AP by transverse by CC). There is evidence of airway compromise. The true cords may be spared as seen on series 2, image 71. Subglottic larynx is within normal limits. Above the vallecula pharyngeal contours appear normal. Normal superior parapharyngeal and retropharyngeal spaces. Salivary glands: Negative sublingual space. Submandibular glands and parotid glands remain within normal limits. Thyroid: Negative. Lymph nodes: Malignant left level IIIb lymph node measures 17 mm short axis and 29 mm long axis (series 2, image 55 and coronal image 73). As stated above it is possible of malignant right level IIIa lymph node is inseparable  from the parent tumor along the right paraglottic space (coronal image 58). Smaller but asymmetrically enlarged left level 4 nodes measure up to 9 mm short axis (coronal image 72). No other abnormal or suspicious lymph nodes identified. Vascular: Major vascular structures in the neck and at the skull base remain patent including both internal jugular veins. The left vertebral artery appears dominant. Calcified atherosclerosis at the skull base. Limited intracranial: Negative. Visualized orbits: Negative. Mastoids and visualized paranasal sinuses: Clear. Skeleton: Absent and carious dentition. Cervical spine degeneration. No suspicious osseous lesion identified. 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 No superior mediastinal lymphadenopathy. Visible axillary lymph nodes are normal. Mild dependent atelectasis.  No upper lung nodule identified. IMPRESSION: 1. 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 Recommend ENT consultation. Involvement of right laryngeal cartilages, with extension in the midline anterior  to the strap muscles, and also extension through the right paraglottic space and/or inseparable malignant right level IIIa lymph node. Estimated tumor long axis 5.2 cm. 2. Malignant contralateral left level 3b lymph node is 2.9 cm long axis. Smaller indeterminate left level 4 lymph nodes. 3. No distant metastatic disease identified in the neck or upper chest. Electronically Signed: By: Genevie Ann M.D. On: 03/11/2020 16:23   DG CHEST PORT 1 VIEW  Result Date: 03/14/2020 CLINICAL DATA:  Respiratory failure with hypercapnia EXAM: PORTABLE CHEST 1 VIEW COMPARISON:  March 10, 2020 FINDINGS: Tracheostomy catheter tip is 6.2 cm above the carina. There is ill-defined airspace opacity in the left upper lobe and left base regions with equivocal left pleural effusion. The right lung is clear. Heart is upper normal in size with pulmonary vascularity normal. No  adenopathy. No bone lesions. IMPRESSION: Tracheostomy as described without pneumothorax. Patchy airspace opacity consistent with scattered areas of pneumonia in the left upper lobe and left base. Equivocal left pleural effusion. Right lung clear. Stable cardiac silhouette. Electronically Signed   By: Lowella Grip III M.D.   On: 03/14/2020 09:17   DG Chest Portable 1 View  Result Date: 03/10/2020 CLINICAL DATA:  Shortness of breath and cough EXAM: PORTABLE CHEST 1 VIEW COMPARISON:  January 23, 2020 FINDINGS: There is bibasilar atelectatic change. Elsewhere the interstitium is mildly thickened. No consolidation. Heart size and pulmonary vascularity are normal. No adenopathy. There is degenerative change in each shoulder. IMPRESSION: Bibasilar atelectasis. Interstitial thickening likely represents a degree of underlying chronic bronchitis. No edema or airspace opacity. Heart size within normal limits. Electronically Signed   By: Lowella Grip III M.D.   On: 03/10/2020 12:42   DG Swallowing Func-Speech Pathology  Result Date: 03/11/2020 Objective Swallowing Evaluation: Type of Study: MBS-Modified Barium Swallow Study  Patient Details Name: Veronica Horton MRN: [id number] Date of Birth: 1960/08/02 Today's Date: 03/11/2020 Time: SLP Start Time (ACUTE ONLY): 6578 -SLP Stop Time (ACUTE ONLY): 1316 SLP Time Calculation (min) (ACUTE ONLY): 21 min Past Medical History: Past Medical History: Diagnosis Date . Bronchitis  . Class 3 obesity 01/23/2020 . COPD (chronic obstructive pulmonary disease) ([place])  . Degenerative disc disease, lumbar  . Hiatal hernia  . Hypertension  Past Surgical History: Past Surgical History: Procedure Laterality Date . CHOLECYSTECTOMY   . degenerative bone disease   . INCISIONAL HERNIA REPAIR N/A 01/15/2019  Procedure: [name] HERNIORRHAPHY WITH MESH;  Surgeon: [name], MD;  Location: [facility];  Service: General;  Laterality: N/A; . OMENTECTOMY N/A 01/15/2019  Procedure:  OMENTECTOMY;  Surgeon: [name], MD;  Location: [facility];  Service: General;  Laterality: N/A; HPI: Teniya Filter  is a 59 y.o. female, with medical history significant of class III obesity, hiatal hernia, hypertension, bronchitis, COPD, chronic respiratory failure on home oxygen at 5 LPM, active smoker of 1-1/2 packs of cigarettes per day , patient presents to ED secondary to shortness of breath and altered mental status, patient with receptive worsening dyspnea over the last 4 days, she is not vaccinated against Covid, she is altered at the time of my examination, history was obtained from boyfriend at bedside, and ED staff, patient still smoking, she still using 5 L of oxygen, he is with increased work of breathing, significantly dyspneic, upon presentation to ED she is altered not provide any complaints at this point. BSE requested.  Subjective: "I haven't been able to swallow my pills for a couple weeks." Assessment / Plan / Recommendation CHL IP CLINICAL  IMPRESSIONS 03/11/2020 Clinical Impression Pt presents with mild pharyngeal phase dysphagia, however appearance of anatomy appears edematous (epiglottis and aryepiglottic folds). Pt with limited dentition and requires extra time to masticate solids, swallow trigger generally at the level of the valleculae, Pt with blunted appearance of epiglottis with reduced deflection resulting in variable trace, flash penetration of thins during the swallow without aspiration and min vallecular residue with solids and brief stasis of barium tablet in valleculae. Pt with prominent cricopharyngeus. Recommend D3/mech soft and thin liquids, po medications whole in puree and follow with liquid wash or per Pt preference. Also strongly recommend additional imaging (neck CT) and ENT consult pending those results. SLP will follow pending results of imaging. Above to RN and MD.  SLP Visit Diagnosis Dysphagia, oropharyngeal phase (R13.12) Attention and concentration deficit  following -- Frontal lobe and executive function deficit following -- Impact on safety and function Mild aspiration risk   CHL IP TREATMENT RECOMMENDATION 03/11/2020 Treatment Recommendations No treatment recommended at this time   Prognosis 03/11/2020 Prognosis for Safe Diet Advancement Fair Barriers to Reach Goals Severity of deficits Barriers/Prognosis Comment -- CHL IP DIET RECOMMENDATION 03/11/2020 SLP Diet Recommendations Dysphagia 3 (Mech soft) solids;Thin liquid Liquid Administration via Cup;Straw Medication Administration Whole meds with puree Compensations Slow rate;Small sips/bites Postural Changes Remain semi-upright after after feeds/meals (Comment);Seated upright at 90 degrees   CHL IP OTHER RECOMMENDATIONS 03/11/2020 Recommended Consults Consider ENT evaluation Oral Care Recommendations Oral care BID Other Recommendations Clarify dietary restrictions   CHL IP FOLLOW UP RECOMMENDATIONS 03/11/2020 Follow up Recommendations None   CHL IP FREQUENCY AND DURATION 03/11/2020 Speech Therapy Frequency (ACUTE ONLY) min 2x/week Treatment Duration 1 week      CHL IP ORAL PHASE 03/11/2020 Oral Phase WFL Oral - Pudding Teaspoon -- Oral - Pudding Cup -- Oral - Honey Teaspoon -- Oral - Honey Cup -- Oral - Nectar Teaspoon -- Oral - Nectar Cup -- Oral - Nectar Straw -- Oral - Thin Teaspoon -- Oral - Thin Cup -- Oral - Thin Straw -- Oral - Puree -- Oral - Mech Soft -- Oral - Regular -- Oral - Multi-Consistency -- Oral - Pill -- Oral Phase - Comment --  CHL IP PHARYNGEAL PHASE 03/11/2020 Pharyngeal Phase Impaired Pharyngeal- Pudding Teaspoon -- Pharyngeal -- Pharyngeal- Pudding Cup -- Pharyngeal -- Pharyngeal- Honey Teaspoon -- Pharyngeal -- Pharyngeal- Honey Cup -- Pharyngeal -- Pharyngeal- Nectar Teaspoon -- Pharyngeal -- Pharyngeal- Nectar Cup -- Pharyngeal -- Pharyngeal- Nectar Straw Pharyngeal residue - valleculae;Reduced epiglottic inversion Pharyngeal -- Pharyngeal- Thin Teaspoon Delayed swallow  initiation-vallecula;Reduced epiglottic inversion Pharyngeal -- Pharyngeal- Thin Cup Reduced epiglottic inversion;Penetration/Aspiration during swallow Pharyngeal Material enters airway, remains ABOVE vocal cords then ejected out Pharyngeal- Thin Straw Reduced epiglottic inversion;Penetration/Aspiration during swallow Pharyngeal Material enters airway, remains ABOVE vocal cords then ejected out Pharyngeal- Puree WFL Pharyngeal -- Pharyngeal- Mechanical Soft -- Pharyngeal -- Pharyngeal- Regular Pharyngeal residue - valleculae;Reduced epiglottic inversion Pharyngeal -- Pharyngeal- Multi-consistency -- Pharyngeal -- Pharyngeal- Pill Pharyngeal residue - valleculae;Reduced epiglottic inversion Pharyngeal -- Pharyngeal Comment edematous pharynx  CHL IP CERVICAL ESOPHAGEAL PHASE 03/11/2020 Cervical Esophageal Phase Impaired Pudding Teaspoon -- Pudding Cup -- Honey Teaspoon -- Honey Cup -- Nectar Teaspoon -- Nectar Cup -- Nectar Straw -- Thin Teaspoon -- Thin Cup Prominent cricopharyngeal segment Thin Straw -- Puree -- Mechanical Soft -- Regular -- Multi-consistency -- Pill -- Cervical Esophageal Comment -- Thank you, [name], [place] PORTER,DABNEY 03/11/2020, 2:12 PM  ECHOCARDIOGRAM COMPLETE  Result Date: 03/11/2020    ECHOCARDIOGRAM REPORT   Patient Name:   Veronica Horton Date of Exam: 03/11/2020 Medical Rec #:  [id number]       Height:       65.0 in Accession #:    [id number]      Weight:       244.0 lb Date of Birth:  1960-09-17       BSA:          2.153 m Patient Age:    81 years        BP:           115/35 mmHg Patient Gender: F               HR:           57 bpm. Exam Location:  [facility] Procedure: 2D Echo Indications:    Dyspnea 786.09 / R06.00  History:        Patient has prior history of Echocardiogram examinations, most                 recent 01/16/2019. COPD; Risk Factors:Hypertension and Current                 Smoker. Acute respiratory failure with hypoxia.  Sonographer:     Leavy Cella RDCS (AE) Referring Phys: [id number] [address] [place] IMPRESSIONS  1. Left ventricular ejection fraction, by estimation, is 70 to 75%. The left ventricle has hyperdynamic function. The left ventricle has no regional wall motion abnormalities. There is mild left ventricular hypertrophy. Left ventricular diastolic parameters were normal.  2. Right ventricular systolic function is normal. The right ventricular size is normal. Tricuspid regurgitation signal is inadequate for assessing PA pressure.  3. The mitral valve is grossly normal. Trivial mitral valve regurgitation.  4. The aortic valve is tricuspid. Aortic valve regurgitation is not visualized.  5. The inferior vena cava is normal in size with greater than 50% respiratory variability, suggesting right atrial pressure of 3 mmHg. FINDINGS  Left Ventricle: Left ventricular ejection fraction, by estimation, is 70 to 75%. The left ventricle has hyperdynamic function. The left ventricle has no regional wall motion abnormalities. The left ventricular internal cavity size was normal in size. There is mild left ventricular hypertrophy. Left ventricular diastolic parameters were normal. Right Ventricle: The right ventricular size is normal. No increase in right ventricular wall thickness. Right ventricular systolic function is normal. Tricuspid regurgitation signal is inadequate for assessing PA pressure. Left Atrium: Left atrial size was normal in size. Right Atrium: Right atrial size was normal in size. Pericardium: There is no evidence of pericardial effusion. Mitral Valve: The mitral valve is grossly normal. Trivial mitral valve regurgitation. Tricuspid Valve: The tricuspid valve is grossly normal. Tricuspid valve regurgitation is trivial. Aortic Valve: The aortic valve is tricuspid. Aortic valve regurgitation is not visualized. Pulmonic Valve: The pulmonic valve was not well visualized. Pulmonic valve regurgitation is not visualized. Aorta: The aortic  root is normal in size and structure. Venous: The inferior vena cava is normal in size with greater than 50% respiratory variability, suggesting right atrial pressure of 3 mmHg. IAS/Shunts: No atrial level shunt detected by color flow Doppler.  LEFT VENTRICLE PLAX 2D LVIDd:         4.05 cm  Diastology LVIDs:         2.30 cm  LV e' medial:    8.59 cm/s LV PW:  1.35 cm  LV E/e' medial:  12.2 LV IVS:        1.34 cm  LV e' lateral:   11.00 cm/s LVOT diam:     2.00 cm  LV E/e' lateral: 9.5 LVOT Area:     3.14 cm  RIGHT VENTRICLE RV S prime:     15.40 cm/s TAPSE (M-mode): 2.9 cm LEFT ATRIUM             Index       RIGHT ATRIUM           Index LA diam:        4.70 cm 2.18 cm/m  RA Area:     13.50 cm LA Vol (A2C):   73.3 ml 34.05 ml/m RA Volume:   35.40 ml  16.44 ml/m LA Vol (A4C):   39.3 ml 18.26 ml/m LA Biplane Vol: 55.6 ml 25.83 ml/m   AORTA Ao Root diam: 2.70 cm MITRAL VALVE MV Area (PHT): 2.69 cm     SHUNTS MV Decel Time: 282 msec     Systemic Diam: 2.00 cm MV E velocity: 105.00 cm/s MV A velocity: 57.80 cm/s MV E/A ratio:  1.82 Rozann Lesches MD Electronically signed by Rozann Lesches MD Signature Date/Time: 03/11/2020/4:59:31 PM    Final     ASSESSMENT & PLAN: 59 year old Caucasian female, with medical history of heavy smoking, COPD on home oxygen 5 L/min, hypertension, presents with worsening dyspnea and altered mental status.  Work-up showed a large laryngeal mass.  Patient required urgent tracheostomy.  1.  Supraglottic laryngeal cancer, [id number] 2. COPD with acute exacerbation, on chronic home oxygen 5 L/min 3. HTN  4.  Heavy smoking history   Recommendations: -I have reviewed her CT scan and her records.  She has at least locally advanced supraglottic laryngeal cancer, with compromised airway, required urgent tracheostomy.  -For staging scan, we ideally would like to get a PET scan.  However this is not feasible while she is in hospital, and there is quite bit social difficult to  get her treated as outpt (she lives an hour away and has no transportation), so please get CT chest, abdomen and pelvis with contrast tomorrow for staging  -although I have not treated many head and neck cancer, but I think the best treatment option would be concurrent chemoradiation for her. I am not sure if she is a candidate for total laryngectomy.  I will refer her to radiation oncologist Dr. Isidore Moos, and my colleague Dr. Chryl Heck who are specialized in treatment of head and neck cancers.  Patient may need to be transferred to [facility] long hospital if we start treatment as inpt this week.  -pt needs feeding tube for nutrition support  -we will update the treatment recommendation tomorrow  -I called pt's sister [name] and updated her and answered her questions. Both pt and her daughter are concerned about her lack of transportation [name] may be to help some), and the financial burden (she has no income) from treatment, please consult case manager.    All questions were answered. The patient knows to call the clinic with any problems, questions or concerns.      Truitt Merle, MD 03/14/2020 5:23 PM

## 2020-03-15 ENCOUNTER — Inpatient Hospital Stay (HOSPITAL_COMMUNITY): Payer: Medicaid Other

## 2020-03-15 DIAGNOSIS — J9621 Acute and chronic respiratory failure with hypoxia: Secondary | ICD-10-CM

## 2020-03-15 DIAGNOSIS — Z930 Tracheostomy status: Secondary | ICD-10-CM

## 2020-03-15 DIAGNOSIS — J387 Other diseases of larynx: Secondary | ICD-10-CM

## 2020-03-15 DIAGNOSIS — J9622 Acute and chronic respiratory failure with hypercapnia: Secondary | ICD-10-CM

## 2020-03-15 LAB — CBC WITH DIFFERENTIAL/PLATELET
Abs Immature Granulocytes: 0.03 10*3/uL (ref 0.00–0.07)
Basophils Absolute: 0 10*3/uL (ref 0.0–0.1)
Basophils Relative: 0 %
Eosinophils Absolute: 0 10*3/uL (ref 0.0–0.5)
Eosinophils Relative: 0 %
HCT: 45.4 % (ref 36.0–46.0)
Hemoglobin: 14.6 g/dL (ref 12.0–15.0)
Immature Granulocytes: 0 %
Lymphocytes Relative: 3 %
Lymphs Abs: 0.2 10*3/uL — ABNORMAL LOW (ref 0.7–4.0)
MCH: 31.6 pg (ref 26.0–34.0)
MCHC: 32.2 g/dL (ref 30.0–36.0)
MCV: 98.3 fL (ref 80.0–100.0)
Monocytes Absolute: 0.6 10*3/uL (ref 0.1–1.0)
Monocytes Relative: 7 %
Neutro Abs: 7.3 10*3/uL (ref 1.7–7.7)
Neutrophils Relative %: 90 %
Platelets: 146 10*3/uL — ABNORMAL LOW (ref 150–400)
RBC: 4.62 MIL/uL (ref 3.87–5.11)
RDW: 14.6 % (ref 11.5–15.5)
WBC: 8.2 10*3/uL (ref 4.0–10.5)
nRBC: 0 % (ref 0.0–0.2)

## 2020-03-15 LAB — MAGNESIUM
Magnesium: 2.7 mg/dL — ABNORMAL HIGH (ref 1.7–2.4)
Magnesium: 2.7 mg/dL — ABNORMAL HIGH (ref 1.7–2.4)

## 2020-03-15 LAB — BASIC METABOLIC PANEL
Anion gap: 7 (ref 5–15)
BUN: 37 mg/dL — ABNORMAL HIGH (ref 6–20)
CO2: 30 mmol/L (ref 22–32)
Calcium: 8.6 mg/dL — ABNORMAL LOW (ref 8.9–10.3)
Chloride: 104 mmol/L (ref 98–111)
Creatinine, Ser: 1.2 mg/dL — ABNORMAL HIGH (ref 0.44–1.00)
GFR, Estimated: 52 mL/min — ABNORMAL LOW (ref >60)
Glucose, Bld: 108 mg/dL — ABNORMAL HIGH (ref 70–99)
Potassium: 4.3 mmol/L (ref 3.5–5.1)
Sodium: 141 mmol/L (ref 135–145)

## 2020-03-15 LAB — PHOSPHORUS
Phosphorus: 3.2 mg/dL (ref 2.5–4.6)
Phosphorus: 3.4 mg/dL (ref 2.5–4.6)

## 2020-03-15 LAB — HEMOGLOBIN A1C
Hgb A1c MFr Bld: 5.4 % (ref 4.8–5.6)
Mean Plasma Glucose: 108.28 mg/dL

## 2020-03-15 LAB — GLUCOSE, CAPILLARY
Glucose-Capillary: 101 mg/dL — ABNORMAL HIGH (ref 70–99)
Glucose-Capillary: 91 mg/dL (ref 70–99)
Glucose-Capillary: 77 mg/dL (ref 70–99)
Glucose-Capillary: 111 mg/dL — ABNORMAL HIGH (ref 70–99)

## 2020-03-15 MED ORDER — IOHEXOL 300 MG/ML  SOLN
100.0000 mL | Freq: Once | INTRAMUSCULAR | Status: AC | PRN
  Administered 2020-03-15: 100 mL via INTRAVENOUS

## 2020-03-15 MED ORDER — PROSOURCE TF PO LIQD
45.0000 mL | Freq: Two times a day (BID) | ORAL | Status: DC
  Administered 2020-03-15 – 2020-03-21 (×14): 45 mL
  Filled 2020-03-15 (×13): qty 45

## 2020-03-15 MED ORDER — INSULIN ASPART 100 UNIT/ML ~~LOC~~ SOLN
0.0000 [IU] | SUBCUTANEOUS | Status: DC
  Administered 2020-03-15: 1 [IU] via SUBCUTANEOUS
  Administered 2020-03-16: 2 [IU] via SUBCUTANEOUS
  Administered 2020-03-16 (×3): 1 [IU] via SUBCUTANEOUS
  Administered 2020-03-16: 2 [IU] via SUBCUTANEOUS
  Administered 2020-03-17 – 2020-03-20 (×11): 1 [IU] via SUBCUTANEOUS
  Administered 2020-03-20: 12:00:00 2 [IU] via SUBCUTANEOUS
  Administered 2020-03-20: 16:00:00 1 [IU] via SUBCUTANEOUS
  Administered 2020-03-21: 2 [IU] via SUBCUTANEOUS
  Administered 2020-03-21 – 2020-03-27 (×7): 1 [IU] via SUBCUTANEOUS
  Administered 2020-03-28 – 2020-03-29 (×2): 2 [IU] via SUBCUTANEOUS
  Administered 2020-03-29 – 2020-03-30 (×3): 1 [IU] via SUBCUTANEOUS
  Administered 2020-03-30: 3 [IU] via SUBCUTANEOUS
  Administered 2020-03-31 – 2020-04-03 (×2): 1 [IU] via SUBCUTANEOUS
  Administered 2020-04-03: 2 [IU] via SUBCUTANEOUS
  Administered 2020-04-04: 12:00:00 1 [IU] via SUBCUTANEOUS
  Administered 2020-04-04: 01:00:00 2 [IU] via SUBCUTANEOUS
  Administered 2020-04-04: 5 [IU] via SUBCUTANEOUS
  Administered 2020-04-05: 1 [IU] via SUBCUTANEOUS
  Administered 2020-04-05: 21:00:00 3 [IU] via SUBCUTANEOUS
  Administered 2020-04-06: 1 [IU] via SUBCUTANEOUS
  Administered 2020-04-06: 01:00:00 2 [IU] via SUBCUTANEOUS
  Administered 2020-04-06 – 2020-04-13 (×9): 1 [IU] via SUBCUTANEOUS
  Administered 2020-04-14: 14:00:00 2 [IU] via SUBCUTANEOUS
  Administered 2020-04-14 – 2020-04-16 (×3): 1 [IU] via SUBCUTANEOUS

## 2020-03-15 MED ORDER — METHYLPREDNISOLONE SODIUM SUCC 40 MG IJ SOLR
40.0000 mg | Freq: Two times a day (BID) | INTRAMUSCULAR | Status: DC
  Administered 2020-03-15 – 2020-03-16 (×2): 40 mg via INTRAVENOUS
  Filled 2020-03-15 (×2): qty 1

## 2020-03-15 MED ORDER — PNEUMOCOCCAL VAC POLYVALENT 25 MCG/0.5ML IJ INJ
0.5000 mL | INJECTION | INTRAMUSCULAR | Status: AC
  Administered 2020-03-18: 0.5 mL via INTRAMUSCULAR
  Filled 2020-03-15: qty 0.5

## 2020-03-15 MED ORDER — OSMOLITE 1.5 CAL PO LIQD
1000.0000 mL | ORAL | Status: DC
  Administered 2020-03-15 – 2020-03-21 (×6): 1000 mL
  Filled 2020-03-15 (×11): qty 1000

## 2020-03-15 NOTE — Procedures (Signed)
Cortrak  Person Inserting Tube:  King, Ivette Castronova E, RD Tube Type:  Cortrak - 43 inches Tube Location:  Left nare Initial Placement:  Stomach Secured by: Bridle Technique Used to Measure Tube Placement:  Documented cm marking at nare/ corner of mouth Cortrak Secured At:  65 cm    Cortrak Tube Team Note:  Consult received to place a Cortrak feeding tube.   No x-ray is required. RN may begin using tube.    If the tube becomes dislodged please keep the tube and contact the Cortrak team at [email or web address] (password [email or web address]) for replacement.  If after hours and replacement cannot be delayed, place a NG tube and confirm placement with an abdominal x-ray.    Itza Maniaci King, MS, RD, LDN Pager number available on Amion 

## 2020-03-15 NOTE — Progress Notes (Signed)
ENT PROGRESS NOTE   Subjective: Patient seen and examined at bedside.  She had CorPak feeding tube placement earlier today and is currently tolerating feeding tubes without difficulty.  She denies nausea, abdominal pain.  She denies pain over tracheostomy site.  She is currently on 40% ATC and tolerating well.  Objective: Vital signs in last 24 hours: Temp:  [97.9 F (36.6 C)-98.4 F (36.9 C)] 98.4 F (36.9 C) (11/15 1529) Pulse Rate:  [56-113] 70 (11/15 1700) Resp:  [11-22] 14 (11/15 1700) BP: (109-156)/(57-94) 130/61 (11/15 1700) SpO2:  [88 %-100 %] 92 % (11/15 1700) FiO2 (%):  [40 %-50 %] 40 % (11/15 1600)  Physical Exam: Constitutional: Obese female, resting comfortably, on vent support, no acute distress HENT: Head : normocephalic and atraumatic Mouth/Throat: Very poor dentition with several broken and missing teeth, remaining dentition with active decay.  Floor mouth soft.  No evidence of mucosal lesions, uvula midline.  Neck:  Shiley tracheostomy secured with sutures and Velcro ties, patent, no significant bleeding around tracheostomy Pulmonary:  On vent support  @[email or web address] (wbc:2,hgb:2,hct:2,plt:2) Recent Labs    03/14/20 0824 03/15/20 0105  NA 141 141  K 4.5 4.3  CL 101 104  CO2 28 30  GLUCOSE 112* 108*  BUN 39* 37*  CREATININE 1.10* 1.20*  CALCIUM 8.9 8.6*    Medications: I have reviewed the patient's current medications.  New Imaging: CT CHEST ABDOMEN PELVIS W CONTRAST: IMPRESSION: 1. Supraglottic mass partially imaged on the first image of the data set. No signs of metastatic disease to the chest, abdomen or pelvis. 2. Rounded partially necrotic lymph node in the LEFT neck partially visualized, corresponding to level IIIb lymph node seen on the neck CT. 3. Basilar consolidative changes bilaterally with similar pattern of consolidation though decreased volume loss when compared to the study of September of 2020. Findings may be related to aspiration. No  definite lesions seen in the chest though these areas could obscure underlying lesions. 4. Postoperative changes with small amount of pneumomediastinum presumably related to recent tracheostomy tube insertion. 5. Ununited fractures along posterior LEFT chest involving ribs 10 through 12. These appear subacute or chronic. These did not appear to be present on previous imaging from September of 2020. 6. Post abdominal wall reconstruction with rectus diastasis, no frank hernia. 7. Aortic atherosclerosis. Tracheostomy as described without pneumothorax. Patchy airspace opacity consistent with scattered areas of pneumonia in the left upper lobe and left base. Equivocal left pleural effusion. Right lung clear. Stable cardiac silhouette.  Pathology: Final pathology pending  Assessment/Plan: Veronica Horton is a 59 y/o F with history of COPD,chronic respiratory failure on home oxygen at 5 L/min,with large laryngeal mass lesion, now POD #2 s/p tracheostomy with Shiley 6-0 and direct laryngoscopy and biopsy. 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 Patient is clinically staged [id number].   - Continue ICU monitoring, appreciate critical care assistance. Please note patient is NOT intubatable from above, please keep sign at head of bed stating this.  - Tape obturator to head of bed - Keep extra 6-0 and 4-0 shiley trach at nursing station - Wean vent as tolerated - Routine trach care, ok deflate trach cuff if not requiring vent support - Continue speech and swallowing therapy once cuff deflated - Do not cut trach sutures until POD #5 - Follow up final pathology results - Oncology consulted appreciate recommendations, CT chest abdomen and pelvis completed today with no evidence of distant metastases.  - Radiation oncology consultation pending - Patient to be discussed  at tumor board on Wednesday.  Based on current clinical staging, she is a candidate for a total laryngectomy with  bilateral neck dissections.  I did discuss the procedure, as well as expected postoperative course and recovery with patient at bedside. At this time she is very hesitant about surgical option for initial treatment.  - Patient will need PET/CT for staging, will defer timing of imaging to Oncology  - Recommend dental evaluation for extraction of remaining dentition - Recommend smoking cessation counseling - Recommend consultation for PEG tube placement as patient will need more permanent means of nutrition during treatment course and recovery - Medical management as per primary team   LOS: 5 days  Thank you for allowing me to participate in the care of this patient. Please do not hesitate to contact me with any questions or concerns.   Jason Coop, [facility] Cell: [phone] 03/15/2020, 6:11 PM

## 2020-03-15 NOTE — Progress Notes (Signed)
Pt placed on 40% ATC and is tolerating well at this time. RN aware. RT to continue to monitor. 

## 2020-03-15 NOTE — Progress Notes (Addendum)
HEMATOLOGY-ONCOLOGY PROGRESS NOTE  SUBJECTIVE: Overall reports that she is feeling well today.  No pain to her trach site.  Denies shortness of breath.  She has no other complaints.  REVIEW OF SYSTEMS:   Constitutional: Denies fevers, chills Eyes: Denies blurriness of vision Ears, nose, mouth, throat, and face: Denies mucositis or sore throat Respiratory: No shortness of breath reported today. Cardiovascular: Denies palpitation, chest discomfort Gastrointestinal:  Denies nausea, heartburn or change in bowel habits Skin: Denies abnormal skin rashes Lymphatics: Denies new lymphadenopathy or easy bruising Neurological:Denies numbness, tingling or new weaknesses Behavioral/Psych: Mood is stable, no new changes  Extremities: No lower extremity edema All other systems were reviewed with the patient and are negative.  I have reviewed the past medical history, past surgical history, social history and family history with the patient and they are unchanged from previous note.   PHYSICAL EXAMINATION: ECOG PERFORMANCE STATUS: 3 - Symptomatic, >50% confined to bed  Vitals:   03/15/20 0735 03/15/20 0800  BP:  (!) 109/94  Pulse:  69  Resp:  16  Temp: 98.2 F (36.8 C)   SpO2:  93%   Filed Weights   03/10/20 1205 03/10/20 1645 03/14/20 0500  Weight: 111.6 kg 110.7 kg 111.7 kg    Intake/Output from previous day: 11/14 0701 - 11/15 0700 In: 1289.2 [I.V.:1038.9; IV Piggyback:250.3] Out: 700 [Urine:700]  GENERAL:alert, no distress and comfortable SKIN: skin color, texture, turgor are normal, no rashes or significant lesions EYES: normal, Conjunctiva are pink and non-injected, sclera clear OROPHARYNX:no exudate, no erythema and lips, buccal mucosa, and tongue normal  NECK: Tracheostomy midline without erythema or drainage LYMPH: Palpable cervical lymph nodes on the left LUNGS: clear to auscultation and percussion with normal breathing effort HEART: regular rate & rhythm and no murmurs  and no lower extremity edema ABDOMEN:abdomen soft, non-tender and normal bowel sounds NEURO: alert & oriented x 3 with fluent speech, no focal motor/sensory deficits  LABORATORY DATA:  I have reviewed the data as listed CMP Latest Ref Rng & Units 03/15/2020 03/14/2020 03/13/2020  Glucose 70 - 99 mg/dL 108(H) 112(H) 103(H)  BUN 6 - 20 mg/dL 37(H) 39(H) 37(H)  Creatinine 0.44 - 1.00 mg/dL 1.20(H) 1.10(H) 1.16(H)  Sodium 135 - 145 mmol/L 141 141 140  Potassium 3.5 - 5.1 mmol/L 4.3 4.5 4.3  Chloride 98 - 111 mmol/L 104 101 98  CO2 22 - 32 mmol/L 30 28 32  Calcium 8.9 - 10.3 mg/dL 8.6(L) 8.9 9.3  Total Protein 6.5 - 8.1 g/dL - - -  Total Bilirubin 0.3 - 1.2 mg/dL - - -  Alkaline Phos 38 - 126 U/L - - -  AST 15 - 41 U/L - - -  ALT 0 - 44 U/L - - -    Lab Results  Component Value Date   WBC 8.2 03/15/2020   HGB 14.6 03/15/2020   HCT 45.4 03/15/2020   MCV 98.3 03/15/2020   PLT 146 (L) 03/15/2020   NEUTROABS 7.3 03/15/2020    CT SOFT TISSUE NECK W CONTRAST  Addendum Date: 03/11/2020   ADDENDUM REPORT: 03/11/2020 16:36 ADDENDUM: Study discussed by telephone with Dr. Jolaine Artist MEMON on 03/11/2020 at 1631 hours. Electronically Signed   By: Genevie Ann M.D.   On: 03/11/2020 16:36   Result Date: 03/11/2020 CLINICAL DATA:  59 year old female with dysphagia. Tonsillitis suspected. EXAM: CT NECK WITH CONTRAST TECHNIQUE: Multidetector CT imaging of the neck was performed using the standard protocol following the bolus administration of intravenous contrast. CONTRAST:  35mL  OMNIPAQUE IOHEXOL 300 MG/ML  SOLN COMPARISON:  Chest CT 01/28/2019. FINDINGS: Pharynx and larynx: Bulky soft tissue mass throughout the bilateral supraglottic larynx and affecting the hypopharynx. Lobulated diffuse thickening of the epiglottis (series 2, image 53). Diffuse false cord and anterior commissure involvement with evidence of extension into the right paraglottic space on series 2 image 61 - where nodular direct extension  of tumor and/or inseparable abnormal right level 3 lymph node protrudes on coronal image 58. Asymmetric erosion of the undersurface of the right thyroid cartilage on image 65. Asymmetric sclerosis of the right arytenoid on image 67., and midline extension of tumor is suspected through the anterior commissure a distance of about 11 mm as seen on series 2, image 67 and sagittal image 48. All told, tumor size is estimated at 37 x 52 by 45 mm (AP by transverse by CC). There is evidence of airway compromise. The true cords may be spared as seen on series 2, image 71. Subglottic larynx is within normal limits. Above the vallecula pharyngeal contours appear normal. Normal superior parapharyngeal and retropharyngeal spaces. Salivary glands: Negative sublingual space. Submandibular glands and parotid glands remain within normal limits. Thyroid: Negative. Lymph nodes: Malignant left level IIIb lymph node measures 17 mm short axis and 29 mm long axis (series 2, image 55 and coronal image 73). As stated above it is possible of malignant right level IIIa lymph node is inseparable from the parent tumor along the right paraglottic space (coronal image 58). Smaller but asymmetrically enlarged left level 4 nodes measure up to 9 mm short axis (coronal image 72). No other abnormal or suspicious lymph nodes identified. Vascular: Major vascular structures in the neck and at the skull base remain patent including both internal jugular veins. The left vertebral artery appears dominant. Calcified atherosclerosis at the skull base. Limited intracranial: Negative. Visualized orbits: Negative. Mastoids and visualized paranasal sinuses: Clear. Skeleton: Absent and carious dentition. Cervical spine degeneration. No suspicious osseous lesion identified. 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 No superior mediastinal lymphadenopathy. Visible axillary lymph nodes are normal. Mild dependent atelectasis.  No upper lung  nodule identified. IMPRESSION: 1. 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 Recommend ENT consultation. Involvement of right laryngeal cartilages, with extension in the midline anterior to the strap muscles, and also extension through the right paraglottic space and/or inseparable malignant right level IIIa lymph node. Estimated tumor long axis 5.2 cm. 2. Malignant contralateral left level 3b lymph node is 2.9 cm long axis. Smaller indeterminate left level 4 lymph nodes. 3. No distant metastatic disease identified in the neck or upper chest. Electronically Signed: By: Genevie Ann M.D. On: 03/11/2020 16:23   DG CHEST PORT 1 VIEW  Result Date: 03/14/2020 CLINICAL DATA:  Respiratory failure with hypercapnia EXAM: PORTABLE CHEST 1 VIEW COMPARISON:  March 10, 2020 FINDINGS: Tracheostomy catheter tip is 6.2 cm above the carina. There is ill-defined airspace opacity in the left upper lobe and left base regions with equivocal left pleural effusion. The right lung is clear. Heart is upper normal in size with pulmonary vascularity normal. No adenopathy. No bone lesions. IMPRESSION: Tracheostomy as described without pneumothorax. Patchy airspace opacity consistent with scattered areas of pneumonia in the left upper lobe and left base. Equivocal left pleural effusion. Right lung clear. Stable cardiac silhouette. Electronically Signed   By: Lowella Grip III M.D.   On: 03/14/2020 09:17   DG Chest Portable 1 View  Result Date: 03/10/2020 CLINICAL DATA:  Shortness of breath  and cough EXAM: PORTABLE CHEST 1 VIEW COMPARISON:  January 23, 2020 FINDINGS: There is bibasilar atelectatic change. Elsewhere the interstitium is mildly thickened. No consolidation. Heart size and pulmonary vascularity are normal. No adenopathy. There is degenerative change in each shoulder. IMPRESSION: Bibasilar atelectasis. Interstitial thickening likely represents a degree of underlying chronic bronchitis. No edema  or airspace opacity. Heart size within normal limits. Electronically Signed   By: Lowella Grip III M.D.   On: 03/10/2020 12:42   DG Swallowing Func-Speech Pathology  Result Date: 03/11/2020 Objective Swallowing Evaluation: Type of Study: MBS-Modified Barium Swallow Study  Patient Details Name: LAMISHA ROUSSELL MRN: [id number] Date of Birth: 1961/04/20 Today's Date: 03/11/2020 Time: SLP Start Time (ACUTE ONLY): 1700 -SLP Stop Time (ACUTE ONLY): 1316 SLP Time Calculation (min) (ACUTE ONLY): 21 min Past Medical History: Past Medical History: Diagnosis Date . Bronchitis  . Class 3 obesity 01/23/2020 . COPD (chronic obstructive pulmonary disease) ([place])  . Degenerative disc disease, lumbar  . Hiatal hernia  . Hypertension  Past Surgical History: Past Surgical History: Procedure Laterality Date . CHOLECYSTECTOMY   . degenerative bone disease   . INCISIONAL HERNIA REPAIR N/A 01/15/2019  Procedure: [name] HERNIORRHAPHY WITH MESH;  Surgeon: [name], MD;  Location: [facility];  Service: General;  Laterality: N/A; . OMENTECTOMY N/A 01/15/2019  Procedure: OMENTECTOMY;  Surgeon: [name], MD;  Location: [facility];  Service: General;  Laterality: N/A; HPI: Marely Apgar  is a 59 y.o. female, with medical history significant of class III obesity, hiatal hernia, hypertension, bronchitis, COPD, chronic respiratory failure on home oxygen at 5 LPM, active smoker of 1-1/2 packs of cigarettes per day , patient presents to ED secondary to shortness of breath and altered mental status, patient with receptive worsening dyspnea over the last 4 days, she is not vaccinated against Covid, she is altered at the time of my examination, history was obtained from boyfriend at bedside, and ED staff, patient still smoking, she still using 5 L of oxygen, he is with increased work of breathing, significantly dyspneic, upon presentation to ED she is altered not provide any complaints at this point. BSE requested.  Subjective: "I haven't been  able to swallow my pills for a couple weeks." Assessment / Plan / Recommendation CHL IP CLINICAL IMPRESSIONS 03/11/2020 Clinical Impression Pt presents with mild pharyngeal phase dysphagia, however appearance of anatomy appears edematous (epiglottis and aryepiglottic folds). Pt with limited dentition and requires extra time to masticate solids, swallow trigger generally at the level of the valleculae, Pt with blunted appearance of epiglottis with reduced deflection resulting in variable trace, flash penetration of thins during the swallow without aspiration and min vallecular residue with solids and brief stasis of barium tablet in valleculae. Pt with prominent cricopharyngeus. Recommend D3/mech soft and thin liquids, po medications whole in puree and follow with liquid wash or per Pt preference. Also strongly recommend additional imaging (neck CT) and ENT consult pending those results. SLP will follow pending results of imaging. Above to RN and MD.  SLP Visit Diagnosis Dysphagia, oropharyngeal phase (R13.12) Attention and concentration deficit following -- Frontal lobe and executive function deficit following -- Impact on safety and function Mild aspiration risk   CHL IP TREATMENT RECOMMENDATION 03/11/2020 Treatment Recommendations No treatment recommended at this time   Prognosis 03/11/2020 Prognosis for Safe Diet Advancement Fair Barriers to Reach Goals Severity of deficits Barriers/Prognosis Comment -- CHL IP DIET RECOMMENDATION 03/11/2020 SLP Diet Recommendations Dysphagia 3 (Mech soft) solids;Thin liquid Liquid Administration via Cup;Straw Medication  Administration Whole meds with puree Compensations Slow rate;Small sips/bites Postural Changes Remain semi-upright after after feeds/meals (Comment);Seated upright at 90 degrees   CHL IP OTHER RECOMMENDATIONS 03/11/2020 Recommended Consults Consider ENT evaluation Oral Care Recommendations Oral care BID Other Recommendations Clarify dietary restrictions   CHL IP  FOLLOW UP RECOMMENDATIONS 03/11/2020 Follow up Recommendations None   CHL IP FREQUENCY AND DURATION 03/11/2020 Speech Therapy Frequency (ACUTE ONLY) min 2x/week Treatment Duration 1 week      CHL IP ORAL PHASE 03/11/2020 Oral Phase WFL Oral - Pudding Teaspoon -- Oral - Pudding Cup -- Oral - Honey Teaspoon -- Oral - Honey Cup -- Oral - Nectar Teaspoon -- Oral - Nectar Cup -- Oral - Nectar Straw -- Oral - Thin Teaspoon -- Oral - Thin Cup -- Oral - Thin Straw -- Oral - Puree -- Oral - Mech Soft -- Oral - Regular -- Oral - Multi-Consistency -- Oral - Pill -- Oral Phase - Comment --  CHL IP PHARYNGEAL PHASE 03/11/2020 Pharyngeal Phase Impaired Pharyngeal- Pudding Teaspoon -- Pharyngeal -- Pharyngeal- Pudding Cup -- Pharyngeal -- Pharyngeal- Honey Teaspoon -- Pharyngeal -- Pharyngeal- Honey Cup -- Pharyngeal -- Pharyngeal- Nectar Teaspoon -- Pharyngeal -- Pharyngeal- Nectar Cup -- Pharyngeal -- Pharyngeal- Nectar Straw Pharyngeal residue - valleculae;Reduced epiglottic inversion Pharyngeal -- Pharyngeal- Thin Teaspoon Delayed swallow initiation-vallecula;Reduced epiglottic inversion Pharyngeal -- Pharyngeal- Thin Cup Reduced epiglottic inversion;Penetration/Aspiration during swallow Pharyngeal Material enters airway, remains ABOVE vocal cords then ejected out Pharyngeal- Thin Straw Reduced epiglottic inversion;Penetration/Aspiration during swallow Pharyngeal Material enters airway, remains ABOVE vocal cords then ejected out Pharyngeal- Puree WFL Pharyngeal -- Pharyngeal- Mechanical Soft -- Pharyngeal -- Pharyngeal- Regular Pharyngeal residue - valleculae;Reduced epiglottic inversion Pharyngeal -- Pharyngeal- Multi-consistency -- Pharyngeal -- Pharyngeal- Pill Pharyngeal residue - valleculae;Reduced epiglottic inversion Pharyngeal -- Pharyngeal Comment edematous pharynx  CHL IP CERVICAL ESOPHAGEAL PHASE 03/11/2020 Cervical Esophageal Phase Impaired Pudding Teaspoon -- Pudding Cup -- Honey Teaspoon -- Honey Cup -- Nectar  Teaspoon -- Nectar Cup -- Nectar Straw -- Thin Teaspoon -- Thin Cup Prominent cricopharyngeal segment Thin Straw -- Puree -- Mechanical Soft -- Regular -- Multi-consistency -- Pill -- Cervical Esophageal Comment -- Thank you, [name], [place] 03/11/2020, 2:12 PM              ECHOCARDIOGRAM COMPLETE  Result Date: 03/11/2020    ECHOCARDIOGRAM REPORT   Patient Name:   TARYNN GARLING Date of Exam: 03/11/2020 Medical Rec #:  [id number]       Height:       65.0 in Accession #:    [id number]      Weight:       244.0 lb Date of Birth:  [birth year]       BSA:          2.153 m Patient Age:    4 years        BP:           115/35 mmHg Patient Gender: F               HR:           57 bpm. Exam Location:  [facility] Procedure: 2D Echo Indications:    Dyspnea 786.09 / R06.00  History:        Patient has prior history of Echocardiogram examinations, most                 recent 01/16/2019. COPD; Risk Factors:Hypertension and Current  Smoker. Acute respiratory failure with hypoxia.  Sonographer:    Leavy Cella RDCS (AE) Referring Phys: [id number] [place] S [place] IMPRESSIONS  1. Left ventricular ejection fraction, by estimation, is 70 to 75%. The left ventricle has hyperdynamic function. The left ventricle has no regional wall motion abnormalities. There is mild left ventricular hypertrophy. Left ventricular diastolic parameters were normal.  2. Right ventricular systolic function is normal. The right ventricular size is normal. Tricuspid regurgitation signal is inadequate for assessing PA pressure.  3. The mitral valve is grossly normal. Trivial mitral valve regurgitation.  4. The aortic valve is tricuspid. Aortic valve regurgitation is not visualized.  5. The inferior vena cava is normal in size with greater than 50% respiratory variability, suggesting right atrial pressure of 3 mmHg. FINDINGS  Left Ventricle: Left ventricular ejection fraction, by estimation, is 70 to 75%. The  left ventricle has hyperdynamic function. The left ventricle has no regional wall motion abnormalities. The left ventricular internal cavity size was normal in size. There is mild left ventricular hypertrophy. Left ventricular diastolic parameters were normal. Right Ventricle: The right ventricular size is normal. No increase in right ventricular wall thickness. Right ventricular systolic function is normal. Tricuspid regurgitation signal is inadequate for assessing PA pressure. Left Atrium: Left atrial size was normal in size. Right Atrium: Right atrial size was normal in size. Pericardium: There is no evidence of pericardial effusion. Mitral Valve: The mitral valve is grossly normal. Trivial mitral valve regurgitation. Tricuspid Valve: The tricuspid valve is grossly normal. Tricuspid valve regurgitation is trivial. Aortic Valve: The aortic valve is tricuspid. Aortic valve regurgitation is not visualized. Pulmonic Valve: The pulmonic valve was not well visualized. Pulmonic valve regurgitation is not visualized. Aorta: The aortic root is normal in size and structure. Venous: The inferior vena cava is normal in size with greater than 50% respiratory variability, suggesting right atrial pressure of 3 mmHg. IAS/Shunts: No atrial level shunt detected by color flow Doppler.  LEFT VENTRICLE PLAX 2D LVIDd:         4.05 cm  Diastology LVIDs:         2.30 cm  LV e' medial:    8.59 cm/s LV PW:         1.35 cm  LV E/e' medial:  12.2 LV IVS:        1.34 cm  LV e' lateral:   11.00 cm/s LVOT diam:     2.00 cm  LV E/e' lateral: 9.5 LVOT Area:     3.14 cm  RIGHT VENTRICLE RV S prime:     15.40 cm/s TAPSE (M-mode): 2.9 cm LEFT ATRIUM             Index       RIGHT ATRIUM           Index LA diam:        4.70 cm 2.18 cm/m  RA Area:     13.50 cm LA Vol (A2C):   73.3 ml 34.05 ml/m RA Volume:   35.40 ml  16.44 ml/m LA Vol (A4C):   39.3 ml 18.26 ml/m LA Biplane Vol: 55.6 ml 25.83 ml/m   AORTA Ao Root diam: 2.70 cm MITRAL VALVE MV  Area (PHT): 2.69 cm     SHUNTS MV Decel Time: 282 msec     Systemic Diam: 2.00 cm MV E velocity: 105.00 cm/s MV A velocity: 57.80 cm/s MV E/A ratio:  1.82 Rozann Lesches MD Electronically signed by Rozann Lesches MD Signature Date/Time: 03/11/2020/4:59:31 PM  Final     ASSESSMENT AND PLAN: 59 year old Caucasian female, with medical history of heavy smoking, COPD on home oxygen 5 L/min, hypertension, presents with worsening dyspnea and altered mental status.  Work-up showed a large laryngeal mass.  Patient required urgent tracheostomy.  1.  Supraglottic laryngeal cancer, [id number] 2. COPD with acute exacerbation, on chronic home oxygen 5 L/min 3. HTN  4.  Heavy smoking history   Recommendations: -I have reviewed her initial CT scan and her records.  She has at least locally advanced supraglottic laryngeal cancer, with compromised airway, required urgent tracheostomy.  -For staging scan, we ideally would like to get a PET scan.  However this is not feasible while she is in hospital, and there is quite bit social difficult to get her treated as outpt (she lives an hour away and has no transportation). CT C/A/P performed earlier today did not show evidence of distant mets.  -We have reached out to ENT to see if she is a candidate for total laryngectomy. They plan to see later today. We have also spoken with Dr. Isidore Moos of Radiation Oncology. They will discuss in tumor board this Wednesday. Concurrent chemoradiation may be a reasonable treatment option if she is not a candidate for total laryngectomy. May need to be transferred to [facility] later this week to begin radiation if total laryngectomy not recommended.  We have also reached out to Dr. Chryl Heck who specializes in treatment of head and neck cancers.   -pt needs feeding tube for nutrition support  -If the patient proceeds with radiation, will reach out to dental medicine for teeth extraction.   LOS: 5 days   Mikey Bussing, DNP,  AGPCNP-BC, AOCNP 03/15/20  Addendum  I saw pt this morning, she is on trach collar, doing better.  NG tube has been placed for feeding..   I reviewed her staging CT scan with her, which was negative for distant metastasis.  We discussed the standard treatment for her locally advanced supraglottic cancer is total laryngectomy, followed by adjuvant radiation and possible chemotherapy, depends on risk features. She has spoken with ENT Dr. Bethann Humble yesterday about her daughter lumpectomy, she is very reluctant to have the surgery.  We discussed the alternative concurrent chemoradiation, which will have 30-50% cure rate, and salvage total laryngectomy if she does not achieve complete response.  Logistics and potential side effects from chemo and radiation were discussed with her today.  After lengthy discussion, patient is leaning towards chemoradiation first.  Her case will be discussed in tumor board tomorrow morning to finalize her treatment plan.  We will update after the conference, and decide on transferring her to [facility] or Thursday, for radiation simulation this Friday and start treatment next Monday.  I told the patient that we would likely start her treatment in the hospital, and complete as outpatient. All questions were answered.   Truitt Merle  03/16/2020

## 2020-03-15 NOTE — Progress Notes (Signed)
Nutrition Follow-up  DOCUMENTATION CODES:   Morbid obesity  INTERVENTION:   Tube Feeding via Cortrak :  Osmolite 1.5 at 60 ml/hr Pro-Source 45 mL BID Provides 112 g of protein, 2240 kcals and 1094 mL of free water Meets 100% estimated calorie and protein needs   NUTRITION DIAGNOSIS:   Increased nutrient needs related to cancer and cancer related treatments, chronic illness as evidenced by estimated needs.  Being addressed via TF   GOAL:   Patient will meet greater than or equal to 90% of their needs  Progressing  MONITOR:   TF tolerance, Diet advancement, Labs, Weight trends  REASON FOR ASSESSMENT:   Consult Assessment of nutrition requirement/status  ASSESSMENT:   60 year old female with history significant of hiatal hernia, HTN, bronchitis, COPD, and chronic respiratory failure on 5 L oxygen at night, presents with AMS and worsening dyspnea over the last 4 days.   11/11 MBS by SLP with abnormal larynx, CT neck with supraglottic mass 11/13 Direct laryngoscopy with biopsy and trach 11/15 CT chest/abdomen pelvis with no signs of metastatic disease, partially necrotic lymph node to left neck  Noted pt with dysphagia to liquids and solids x 3 weeks PTA. Based on weight encounters, no weight loss since September admission  Pt with newly diagnosed laryngeal cancer with large laryngeal mass  Pt remains on vent support via trach, tolerating SBT with plans for TCT Cortrak placement today. NPO  Current wt 111.7 kg; admit wt 111 kg  Labs: reviewed Meds: ss novolog, LR at 50 ml/hr, solumedrol, MVI with Minearls   Diet Order:   Diet Order            Diet NPO time specified  Diet effective now                 EDUCATION NEEDS:   Not appropriate for education at this time  Skin:  Skin Assessment: Skin Integrity Issues: Skin Integrity Issues:: Other (Comment) Other: Cellulits; LLE; MASD; R thigh  Last BM:  11/13  Height:   Ht Readings from Last 1  Encounters:  03/14/20 5\' 5"  (1.651 m)    Weight:   Wt Readings from Last 1 Encounters:  03/14/20 111.7 kg    BMI:  Body mass index is 40.98 kg/m.  Estimated Nutritional Needs:   Kcal:  2000-2300 kcals  Protein:  110-125  Fluid:  > 2 L/day   Kerman Passey MS, RDN, LDN, CNSC Registered Dietitian III Clinical Nutrition RD Pager and On-Call Pager Number Located in [place]

## 2020-03-15 NOTE — Progress Notes (Signed)
Physical Therapy Treatment Patient Details Name: Veronica Horton MRN: [id number] DOB: 17-Apr-1961 Today's Date: 03/15/2020    History of Present Illness [name] is a 59 year old female with medical history significant for COPD with chronic hypoxic respiratory failure on 5 L, HTN, tobacco use, obesity class 3 who presented on 11/10 with worsening shortness of breath over the past 4 days and increased confusion. Pt found to be in COPD exacerbation, hospital course at [facility] complicated by dysphasia, pt found to have a laryngeal mass. Pt transferred to [facility]. Pt underwent trach placement on 11/13 s/p layrngeal mass biopsy, went on vent 11/14 in AM due to desaturation..    PT Comments    Pt admitted with above diagnosis. Pt was able to ambulate with RW a few feet beside bed limited by lines.  Pt should progress well when not limited by lines.  Will continue to follow pt.  Pt currently with functional limitations due to balance and endurance deficits. Pt will benefit from skilled PT to increase their independence and safety with mobility to allow discharge to the venue listed below.     Follow Up Recommendations  Home health PT;Supervision/Assistance - 24 hour     Equipment Recommendations  None recommended by PT    Recommendations for Other Services       Precautions / Restrictions Precautions Precautions: Fall Precaution Comments: Vent with trach Restrictions Weight Bearing Restrictions: No    Mobility  Bed Mobility Overal bed mobility: Needs Assistance Bed Mobility: Supine to Sit     Supine to sit: Supervision     General bed mobility comments: HOB elevated, pt brought self to EOB, supervision for line management, verbal cues for safety, increased time  Transfers Overall transfer level: Needs assistance Equipment used: 2 person hand held assist;Rolling walker (2 wheeled) Transfers: Sit to/from Stand Sit to Stand: +2 safety/equipment;Min guard Stand pivot transfers: Min  guard       General transfer comment: min guard A to power up and manage lines and vent  Ambulation/Gait Ambulation/Gait assistance: Min guard;Min assist Gait Distance (Feet): 12 Feet (4 feet x 3) Assistive device: Rolling walker (2 wheeled) Gait Pattern/deviations: Decreased step length - right;Decreased step length - left;Decreased stride length;Trunk flexed Gait velocity: decreased Gait velocity interpretation: <1.31 ft/sec, indicative of household ambulator General Gait Details: Pt walked 4 feet forward and back as long as the vent tubes would reach beside bed.  Pt did this x 3 x.  Steady overall. Limited by vent tubes.    Stairs             Wheelchair Mobility    Modified Rankin (Stroke Patients Only)       Balance Overall balance assessment: Needs assistance Sitting-balance support: Feet unsupported;No upper extremity supported Sitting balance-Leahy Scale: Good     Standing balance support: Bilateral upper extremity supported;During functional activity Standing balance-Leahy Scale: Poor Standing balance comment: dependent on bilat UE support                            Cognition Arousal/Alertness: Awake/alert Behavior During Therapy: WFL for tasks assessed/performed Overall Cognitive Status: Within Functional Limits for tasks assessed                                        Exercises General Exercises - Lower Extremity Ankle Circles/Pumps: AROM;Both;10 reps;Supine Heel Slides: AROM;Both;10  reps;Supine    General Comments General comments (skin integrity, edema, etc.): Vent setting 50%FiO2 and PEEP 8.  Desat with coughing x 1 to 83% but rebounded to >90% most of session, other VSS      Pertinent Vitals/Pain Pain Assessment: No/denies pain    Home Living                      Prior Function            PT Goals (current goals can now be found in the care plan section) Acute Rehab PT Goals Patient Stated Goal:  didn't state Progress towards PT goals: Progressing toward goals    Frequency    Min 3X/week      PT Plan Current plan remains appropriate    Co-evaluation              AM-PAC PT "6 Clicks" Mobility   Outcome Measure  Help needed turning from your back to your side while in a flat bed without using bedrails?: None Help needed moving from lying on your back to sitting on the side of a flat bed without using bedrails?: None Help needed moving to and from a bed to a chair (including a wheelchair)?: A Little Help needed standing up from a chair using your arms (e.g., wheelchair or bedside chair)?: A Little Help needed to walk in hospital room?: A Little Help needed climbing 3-5 steps with a railing? : A Lot 6 Click Score: 19    End of Session Equipment Utilized During Treatment: Oxygen;Gait belt Activity Tolerance: Patient tolerated treatment well Patient left: in bed;with bed alarm set;with call bell/phone within reach Nurse Communication: Mobility status PT Visit Diagnosis: Unsteadiness on feet (R26.81);Other abnormalities of gait and mobility (R26.89);Muscle weakness (generalized) (M62.81)     Time: 7681-1572 PT Time Calculation (min) (ACUTE ONLY): 19 min  Charges:  $Gait Training: 8-22 mins                     Wynee Matarazzo W,PT [place] Pager:  [phone]  Office:  [place] 03/15/2020, 1:32 PM

## 2020-03-15 NOTE — Progress Notes (Signed)
Patient requests to have all vaccines given if applicable; Covid immunization, shingles immunization, Pneumonia immunization, and influenza immunization. Will discuss with provider team if appropriate at this time in am

## 2020-03-15 NOTE — Progress Notes (Signed)
Pt transported from [place] to CT and back on full vent support. No complications noted.

## 2020-03-15 NOTE — Progress Notes (Signed)
SLP Cancellation Note  Patient Details Name: Veronica Horton MRN: [id number] DOB: 1961/03/02   Cancelled evaluation: Pt remains on ventilator s/p trach due to desaturation.  SLP will follow along for readiness for speech/swallowing assessments.  Pt will need PMV eval orders.   Leannah Guse L. Tivis Ringer, [place] Office number [phone] Pager [phone]            Juan Quam Laurice 03/15/2020, 9:49 AM

## 2020-03-15 NOTE — Consult Note (Signed)
NAME:  Veronica Horton, MRN:  [id number], DOB:  June 16, 1960, LOS: 5 ADMISSION DATE:  03/10/2020, CONSULTATION DATE:  03/15/20 REFERRING MD:  Hospitalist, CHIEF COMPLAINT:  Respiratory failure   Brief History   59 y.o. F with PMH of COPD 11/10 with shortness of breath found to have laryngeal mass concerning for malignancy s/p biopsy and tracheostomy who was transferred to the intensive care unit as she required ventilator support.  History of present illness   Veronica Horton is a 59y/o F with PMH of COPD on 5L home O2, DDD and HTN who presented on 11/10 for shortness of breath and was treated initially for a COPD exacerbation on bipap. She underwent SLP evaluation and barium that demonstrated supraglottic soft tissue mass. CT of the neck was performed which demonstrated a bulky soft tissue mass in the supraglottis with narrowing of the airway, involvement of the right laryngeal cartilage with extension to the strap muscles and the right paraglottic space, questionable bilateral cervical adenopathy concerning for regional metastatic disease.  She underwent direct laryngoscopy with biopsy and tracheostomy on 11/13 and required transfer to intensive care.  Oncology was consulted and given she is multiple to get a PET scan while inpatient CT chest/abdomen/pelvis with contrast was ordered.  Recommendations from head and neck cancer specialist are pending and patient may need transfer to [facility].  Past Medical History   has a past medical history of Bronchitis, Class 3 obesity (01/23/2020), COPD (chronic obstructive pulmonary disease) ([place]), Degenerative disc disease, lumbar, Hiatal hernia, and Hypertension.  Significant Hospital Events   11/10 to hospitalists 11/13 laryngoscopy and biopsy with tracheostomy 11/15 transferred to intensive care unit  Consults:  ENT Oncology PCCM  Procedures:  11/13 tracheostomy by ENT  Significant Diagnostic Tests:  11/11 CT soft tissue neck with  contrast>>Bulky bilateral supraglottic tumor with possible airway Compromise, Involvement of right laryngeal cartilages, with extension in the midline anterior to the strap muscles, and also extension through the right paraglottic space and/or inseparable malignant right level IIIa lymph node.  11/15 CT chest/abdomen/pelvis>> no signs of metastatic disease, rounded partially necrotic lymph node to the left neck, basilar consolidative changes similar to prior, small pneumomediastinum  Micro Data:  11/10 Covid-19, influenza>> negative 11/10 MRSA>> give 11/14 sputum culture>>   Antimicrobials:  Doxycycline 11/10-11/13 Levofloxacin 11/14-  Interim history/subjective:  Patient has been on ventilator support for the last day, currently awake alert and in no distress 50% FiO2 and PEEP of 8  Objective   Blood pressure (!) 109/94, pulse 69, temperature 98.2 F (36.8 C), temperature source Oral, resp. rate 16, height 5\' 5"  (1.651 m), weight 111.7 kg, SpO2 95 %.    Vent Mode: PRVC FiO2 (%):  [50 %-60 %] 50 % Set Rate:  [14 bmp] 14 bmp Vt Set:  [450 mL] 450 mL PEEP:  [8 cmH20] 8 cmH20 Plateau Pressure:  [11 cmH20-16 cmH20] 12 cmH20   Intake/Output Summary (Last 24 hours) at 03/15/2020 1048 Last data filed at 03/15/2020 0800 Gross per 24 hour  Intake 1113.73 ml  Output 700 ml  Net 413.73 ml   Filed Weights   03/10/20 1205 03/10/20 1645 03/14/20 0500  Weight: 111.6 kg 110.7 kg 111.7 kg    General: Elderly appearing female, awake and responsive in no distress HEENT: MM pink/moist, tracheostomy in place without bleeding Neuro: Awake and alert, mouthing and writing responses to questions CV: s1s2 RRR, no m/r/g PULM: Mechanical breath sounds without wheezing or rhonchi bilaterally, diminished in bilateral bases GI:  soft, bsx4 active  Extremities: warm/dry, trace pedal edema  Skin: no rashes or lesions  Resolved Hospital Problem list     Assessment & Plan:   Acute hypoxic  respiratory failure in the setting of supraglottic laryngeal cancer with airway compromise and history of COPD on 5L home O2 Status post 6-0 [place] tracheostomy, subsequent respiratory difficulty requiring full vent support and ICU transfer P: -ENT and oncology following, 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  May need transfer to [facility] for chemoradiation. -SBT with good volumes, trial trach collar  -Currently n.p.o., core track placement today -No wheezing on exam, start tapering IV Solu-Medrol from 40 mg every 6 to 40 mg twice daily -On levofloxacin for possible CAP, chronic consolidative changes on CXR without leukocytosis or fever.  Suptum culture growing Serratia so continue flouroquinolone pending susceptibilites -continue nebs and Breo, flutter valve and incentive spirometry  GERD -on PPI   HTN -Currently controlled without home Lasix or lisinopril   Mild AKI Creatinine 1.2 P: -Follow renal indices and urine output and avoid nephrotoxins  Best practice:  Diet: NPO Pain/Anxiety/Delirium protocol (if indicated): Fentanyl as needed  VAP protocol (if indicated): HOB 30 degrees suction as needed  DVT prophylaxis: Lovenox GI prophylaxis: PPI Glucose control: SSI Mobility: Out of bed as tolerated with PT Code Status: Full code Family Communication: Pending Disposition: ICU  Labs   CBC: Recent Labs  Lab 03/10/20 1227 03/12/20 0352 03/13/20 0849 03/14/20 0824 03/15/20 0105  WBC 10.1 18.7* 10.2 10.6* 8.2  NEUTROABS 8.1*  --   --   --  7.3  HGB 14.5 15.0 15.0 15.5* 14.6  HCT 47.2* 48.4* 47.4* 47.8* 45.4  MCV 101.9* 100.6* 97.1 98.0 98.3  PLT 220 248 208 203 146*    Basic Metabolic Panel: Recent Labs  Lab 03/10/20 1226 03/10/20 1600 03/12/20 0352 03/13/20 0849 03/14/20 0824 03/15/20 0105  NA 137  --  140 140 141 141  K 4.6  --  4.0 4.3 4.5 4.3   CL 96*  --  95* 98 101 104  CO2 34*  --  32 32 28 30  GLUCOSE 104*  --  131* 103* 112* 108*  BUN 16  --  33* 37* 39* 37*  CREATININE 1.01*  --  1.10* 1.16* 1.10* 1.20*  CALCIUM 8.6*  --  9.7 9.3 8.9 8.6*  MG  --  2.3  --   --   --   --    GFR: Estimated Creatinine Clearance: 62.1 mL/min (A) (by C-G formula based on SCr of 1.2 mg/dL (H)). Recent Labs  Lab 03/10/20 1227 03/10/20 1227 03/10/20 1405 03/12/20 0352 03/13/20 0849 03/14/20 0824 03/15/20 0105  WBC 10.1   < >  --  18.7* 10.2 10.6* 8.2  LATICACIDVEN 0.6  --  0.6  --   --   --   --    < > = values in this interval not displayed.    Liver Function Tests: Recent Labs  Lab 03/10/20 1226  AST 14*  ALT 12  ALKPHOS 79  BILITOT 0.8  PROT 7.3  ALBUMIN 3.6   No results for input(s): LIPASE, AMYLASE in the last 168 hours. No results for input(s): AMMONIA in the last 168 hours.  ABG    Component Value Date/Time   PHART 7.373 03/14/2020 0936   PCO2ART 55.4 (H) 03/14/2020 0936   PO2ART 108 03/14/2020 0936   HCO3 31.7 (H)  03/14/2020 0936   O2SAT 97.8 03/14/2020 0936     Coagulation Profile: No results for input(s): INR, PROTIME in the last 168 hours.  Cardiac Enzymes: No results for input(s): CKTOTAL, CKMB, CKMBINDEX, TROPONINI in the last 168 hours.  HbA1C: No results found for: HGBA1C  CBG: Recent Labs  Lab 03/14/20 1131 03/14/20 1527 03/14/20 1939 03/14/20 2317 03/15/20 0733  GLUCAP 79 95 94 105* 101*    Review of Systems:   Unable to fully obtain as patient is unable to speak  Past Medical History  She,  has a past medical history of Bronchitis, Class 3 obesity (01/23/2020), COPD (chronic obstructive pulmonary disease) ([place]), Degenerative disc disease, lumbar, Hiatal hernia, and Hypertension.   Surgical History    Past Surgical History:  Procedure Laterality Date  . CHOLECYSTECTOMY    . degenerative bone disease    . DIRECT LARYNGOSCOPY N/A 03/13/2020   Procedure: DIRECT LARYNGOSCOPY WITH  BIOPSY;  Surgeon: Jason Coop, DO;  Location: [place];  Service: ENT;  Laterality: N/A;  . INCISIONAL HERNIA REPAIR N/A 01/15/2019   Procedure: [name] HERNIORRHAPHY WITH MESH;  Surgeon: [name], MD;  Location: [facility];  Service: General;  Laterality: N/A;  . OMENTECTOMY N/A 01/15/2019   Procedure: OMENTECTOMY;  Surgeon: [name], MD;  Location: [facility];  Service: General;  Laterality: N/A;  . TRACHEOSTOMY TUBE PLACEMENT N/A 03/13/2020   Procedure: AWAKE TRACHEOSTOMY;  Surgeon: Jason Coop, DO;  Location: [facility];  Service: ENT;  Laterality: N/A;     Social History   reports that she has been smoking. She has been smoking about 1.00 pack per day. She has never used smokeless tobacco. She reports that she does not drink alcohol and does not use drugs.   Family History   Her family history includes Hypertension in her mother. There is no history of Sudden Cardiac Death.   Allergies Allergies  Allergen Reactions  . Codeine Hives  . Penicillins Hives    Did it involve swelling of the face/tongue/throat, SOB, or low BP? No Did it involve sudden or severe rash/hives, skin peeling, or any reaction on the inside of your mouth or nose? Yes Did you need to seek medical attention at a hospital or doctor's office? Unknown When did it last happen?Over 10 years If all above answers are "NO", may proceed with cephalosporin use.      Home Medications  Prior to Admission medications   Medication Sig Start Date End Date Taking? Authorizing Provider  albuterol (PROVENTIL) (2.5 MG/3ML) 0.083% nebulizer solution Take 2.5 mg by nebulization every 6 (six) hours as needed for wheezing or shortness of breath.   Yes [provider]  albuterol (VENTOLIN HFA) 108 (90 Base) MCG/ACT inhaler Inhale 1-2 puffs into the lungs every 6 (six) hours as needed for wheezing or shortness of breath.   Yes [provider]  feeding supplement, ENSURE ENLIVE, (ENSURE ENLIVE) LIQD  Take 237 mLs by mouth 2 (two) times daily between meals. 01/25/20  Yes Shah, Pratik D, DO  folic acid (FOLVITE) 1 MG tablet Take 1 tablet (1 mg total) by mouth daily. 01/31/19  Yes [name], MD  furosemide (LASIX) 40 MG tablet Take 1 tablet (40 mg total) by mouth daily. 01/30/19 03/14/20 Yes [name], MD  gabapentin (NEURONTIN) 300 MG capsule Take 600 mg by mouth 3 (three) times daily.  12/30/18  Yes [provider]  lisinopril (ZESTRIL) 20 MG tablet Take 1 tablet (20 mg total) by mouth daily. 01/31/19  Yes [name], MD  budesonide-formoterol [facility]) 160-4.5 MCG/ACT inhaler Inhale 2 puffs into the lungs 2 (two) times daily. Patient not taking: Reported on 03/14/2020 01/30/19   [name], MD  cyclobenzaprine (FLEXERIL) 10 MG tablet Take 10 mg by mouth daily as needed for muscle spasms. Patient not taking: Reported on 03/14/2020    [provider]  nicotine (NICODERM CQ - DOSED IN MG/24 HR) 7 mg/24hr patch Place 1 patch (7 mg total) onto the skin daily. Patient not taking: Reported on 03/14/2020 01/26/20   Heath Lark D, DO  saccharomyces boulardii (FLORASTOR) 250 MG capsule Take 1 capsule (250 mg total) by mouth 2 (two) times daily. Patient not taking: Reported on 01/23/2020 01/30/19   [name], MD  vitamin B-12 (CYANOCOBALAMIN) 500 MCG tablet Take 1 tablet (500 mcg total) by mouth daily. Patient not taking: Reported on 01/23/2020 01/31/19   [name], MD     Critical care time: 35 minutes    CRITICAL CARE Performed by: Otilio Carpen Brytnee Bechler   Total critical care time: 35 minutes  Critical care time was exclusive of separately billable procedures and treating other patients.  Critical care was necessary to treat or prevent imminent or life-threatening deterioration.  Critical care was time spent personally by me on the following activities: development of treatment plan with patient and/or surrogate as well as nursing, discussions with consultants,  evaluation of patient's response to treatment, examination of patient, obtaining history from patient or surrogate, ordering and performing treatments and interventions, ordering and review of laboratory studies, ordering and review of radiographic studies, pulse oximetry and re-evaluation of patient's condition.   Otilio Carpen Maxxon Schwanke, PA-C [place] P[facility]  Pager# [phone], if no answer [phone]

## 2020-03-15 NOTE — Evaluation (Signed)
Occupational Therapy Evaluation Patient Details Name: Veronica Horton MRN: [id number] DOB: 1960/08/27 Today's Date: 03/15/2020    History of Present Illness Veronica Horton is a 59 year old female with medical history significant for COPD with chronic hypoxic respiratory failure on 5 L, HTN, tobacco use, obesity class 3 who presented on 11/10 with worsening shortness of breath over the past 4 days and increased confusion. Pt found to be in COPD exacerbation, hospital course at [facility] complicated by dysphasia, pt found to have a laryngeal mass. Pt transferred to [facility]. Pt underwent trach placement on 11/13 s/p layrngeal mass biopsy, went on vent 11/14 in AM due to desaturation..   Clinical Impression   Limited evaluation due to pt on ventilator. She is typically independent in ADL and walks with a cane or walker. She presents with generalized weakness, impaired standing balance and decreased activity tolerance. She requires set up to moderate assistance for ADL. Pt likely to progress well and has good family support at home. Will follow acutely.    Follow Up Recommendations  Home health OT;Supervision/Assistance - 24 hour    Equipment Recommendations  3 in 1 bedside commode    Recommendations for Other Services       Precautions / Restrictions Precautions Precautions: Fall Precaution Comments: Vent with trach, cortrak Restrictions Weight Bearing Restrictions: No      Mobility Bed Mobility Overal bed mobility: Needs Assistance Bed Mobility: Supine to Sit;Sit to Supine     Supine to sit: Supervision Sit to supine: Supervision   General bed mobility comments: HOB elevated, pt brought self to EOB, supervision for line management, verbal cues for safety, increased time    Transfers Overall transfer level: Needs assistance Equipment used: 1 person hand held assist Transfers: Sit to/from Stand Sit to Stand: Min guard;+2 safety/equipment Stand pivot transfers: Min guard        General transfer comment: min guard for safety and line management    Balance Overall balance assessment: Needs assistance Sitting-balance support: Feet unsupported;No upper extremity supported Sitting balance-Leahy Scale: Good     Standing balance support: Single extremity supported Standing balance-Leahy Scale: Poor Standing balance comment: dependent on minimal hand held assist                           ADL either performed or assessed with clinical judgement   ADL Overall ADL's : Needs assistance/impaired Eating/Feeding: NPO   Grooming: Set up;Bed level;Oral care   Upper Body Bathing: Minimal assistance;Sitting   Lower Body Bathing: Sit to/from stand;Moderate assistance   Upper Body Dressing : Minimal assistance;Sitting   Lower Body Dressing: Maximal assistance;Sit to/from stand   Toilet Transfer: Minimal assistance   Toileting- Clothing Manipulation and Hygiene: Minimal assistance;Sit to/from stand         General ADL Comments: pt reports she typically pulls her leg up onto the bed to don her socks     Vision Baseline Vision/History: Wears glasses Wears Glasses: Reading only Patient Visual Report: No change from baseline       Perception     Praxis      Pertinent Vitals/Pain Pain Assessment: No/denies pain     Hand Dominance Right   Extremity/Trunk Assessment Upper Extremity Assessment Upper Extremity Assessment: Overall WFL for tasks assessed   Lower Extremity Assessment Lower Extremity Assessment: Defer to PT evaluation   Cervical / Trunk Assessment Cervical / Trunk Assessment: Normal (reluctant to turn head with trach and cortrak)   Communication Communication  Communication: Tracheostomy;Other (comment) (communicated by writing)   Cognition Arousal/Alertness: Awake/alert Behavior During Therapy: WFL for tasks assessed/performed Overall Cognitive Status: Within Functional Limits for tasks assessed                                      General Comments  vent on cpap    Exercises    Shoulder Instructions      Home Living Family/patient expects to be discharged to:: Private residence Living Arrangements: Spouse/significant other;Other relatives (aunt/cousin) Available Help at Discharge: Friend(s);Available 24 hours/day (boyfriend and sister) Type of Home: Mobile home Home Access: Ramped entrance     Home Layout: One level     Bathroom Shower/Tub: Walk-in shower;Other (comment) (garden tub)   Bathroom Toilet: Standard     Home Equipment: Environmental consultant - 2 wheels;Cane - single point          Prior Functioning/Environment Level of Independence: Independent with assistive device(s)        Comments: used cane and RW to aide in amb, pt doesn't work        OT Problem List: Decreased strength;Decreased activity tolerance;Impaired balance (sitting and/or standing);Decreased knowledge of use of DME or AE;Obesity;Cardiopulmonary status limiting activity      OT Treatment/Interventions: Self-care/ADL training;DME and/or AE instruction;Therapeutic activities;Patient/family education;Balance training    OT Goals(Current goals can be found in the care plan section) Acute Rehab OT Goals Patient Stated Goal: to eat OT Goal Formulation: With patient Time For Goal Achievement: 03/29/20 Potential to Achieve Goals: Good ADL Goals Pt Will Perform Grooming: with supervision;standing Pt Will Perform Lower Body Bathing: with supervision;sit to/from stand Pt Will Perform Lower Body Dressing: with supervision;sit to/from stand Pt Will Transfer to Toilet: with supervision;ambulating;regular height toilet (determine need for 3 in1) Pt Will Perform Toileting - Clothing Manipulation and hygiene: with supervision;sit to/from stand Additional ADL Goal #1: Pt will implement energy conservation strategies in ADL and mobility.  OT Frequency: Min 2X/week   Barriers to D/C:            Co-evaluation               AM-PAC OT "6 Clicks" Daily Activity     Outcome Measure Help from another person eating meals?: Total Help from another person taking care of personal grooming?: A Little Help from another person toileting, which includes using toliet, bedpan, or urinal?: A Little Help from another person bathing (including washing, rinsing, drying)?: A Lot Help from another person to put on and taking off regular upper body clothing?: A Little Help from another person to put on and taking off regular lower body clothing?: A Lot 6 Click Score: 14   End of Session Equipment Utilized During Treatment: Other (comment) (vent)  Activity Tolerance: Treatment limited secondary to medical complications (Comment) (vent on CPAP) Patient left: in bed;with call bell/phone within reach  OT Visit Diagnosis: Unsteadiness on feet (R26.81);Muscle weakness (generalized) (M62.81)                Time: 3151-7616 OT Time Calculation (min): 13 min Charges:  OT General Charges $OT Visit: 1 Visit OT Evaluation $OT Eval High Complexity: 1 High  Nestor Lewandowsky, OTR/L Acute Rehabilitation Services Pager: [phone] Office: [phone]  Malka So 03/15/2020, 2:46 PM

## 2020-03-15 NOTE — Plan of Care (Signed)
Called PCCM given patient transferred to ICU on 03/13/20 for ventilator support s/p tracheostomy and continues on ventilator TRH will sign off while she is in ICU.  [facility]

## 2020-03-16 DIAGNOSIS — J189 Pneumonia, unspecified organism: Secondary | ICD-10-CM

## 2020-03-16 DIAGNOSIS — C760 Malignant neoplasm of head, face and neck: Secondary | ICD-10-CM

## 2020-03-16 DIAGNOSIS — Y95 Nosocomial condition: Secondary | ICD-10-CM

## 2020-03-16 LAB — CBC WITH DIFFERENTIAL/PLATELET
Abs Immature Granulocytes: 0.05 10*3/uL (ref 0.00–0.07)
Basophils Absolute: 0 10*3/uL (ref 0.0–0.1)
Basophils Relative: 0 %
Eosinophils Absolute: 0 10*3/uL (ref 0.0–0.5)
Eosinophils Relative: 0 %
HCT: 44.8 % (ref 36.0–46.0)
Hemoglobin: 14.1 g/dL (ref 12.0–15.0)
Immature Granulocytes: 1 %
Lymphocytes Relative: 9 %
Lymphs Abs: 0.6 10*3/uL — ABNORMAL LOW (ref 0.7–4.0)
MCH: 30.9 pg (ref 26.0–34.0)
MCHC: 31.5 g/dL (ref 30.0–36.0)
MCV: 98.2 fL (ref 80.0–100.0)
Monocytes Absolute: 0.7 10*3/uL (ref 0.1–1.0)
Monocytes Relative: 11 %
Neutro Abs: 5.5 10*3/uL (ref 1.7–7.7)
Neutrophils Relative %: 79 %
Platelets: 121 10*3/uL — ABNORMAL LOW (ref 150–400)
RBC: 4.56 MIL/uL (ref 3.87–5.11)
RDW: 14.6 % (ref 11.5–15.5)
WBC: 6.9 10*3/uL (ref 4.0–10.5)
nRBC: 0 % (ref 0.0–0.2)

## 2020-03-16 LAB — BASIC METABOLIC PANEL
Anion gap: 9 (ref 5–15)
BUN: 35 mg/dL — ABNORMAL HIGH (ref 6–20)
CO2: 30 mmol/L (ref 22–32)
Calcium: 8.8 mg/dL — ABNORMAL LOW (ref 8.9–10.3)
Chloride: 105 mmol/L (ref 98–111)
Creatinine, Ser: 1.13 mg/dL — ABNORMAL HIGH (ref 0.44–1.00)
GFR, Estimated: 56 mL/min — ABNORMAL LOW (ref >60)
Glucose, Bld: 126 mg/dL — ABNORMAL HIGH (ref 70–99)
Potassium: 3.9 mmol/L (ref 3.5–5.1)
Sodium: 144 mmol/L (ref 135–145)

## 2020-03-16 LAB — CULTURE, RESPIRATORY W GRAM STAIN: Gram Stain: NONE SEEN

## 2020-03-16 LAB — GLUCOSE, CAPILLARY
Glucose-Capillary: 146 mg/dL — ABNORMAL HIGH (ref 70–99)
Glucose-Capillary: 147 mg/dL — ABNORMAL HIGH (ref 70–99)
Glucose-Capillary: 155 mg/dL — ABNORMAL HIGH (ref 70–99)
Glucose-Capillary: 151 mg/dL — ABNORMAL HIGH (ref 70–99)
Glucose-Capillary: 127 mg/dL — ABNORMAL HIGH (ref 70–99)
Glucose-Capillary: 99 mg/dL (ref 70–99)

## 2020-03-16 LAB — MAGNESIUM: Magnesium: 2.6 mg/dL — ABNORMAL HIGH (ref 1.7–2.4)

## 2020-03-16 LAB — PHOSPHORUS: Phosphorus: 3.2 mg/dL (ref 2.5–4.6)

## 2020-03-16 MED ORDER — METHYLPREDNISOLONE SODIUM SUCC 40 MG IJ SOLR
40.0000 mg | Freq: Every day | INTRAMUSCULAR | Status: DC
  Administered 2020-03-17 – 2020-03-19 (×3): 40 mg via INTRAVENOUS
  Filled 2020-03-16 (×3): qty 1

## 2020-03-16 MED ORDER — BUDESONIDE 0.5 MG/2ML IN SUSP
0.5000 mg | Freq: Two times a day (BID) | RESPIRATORY_TRACT | Status: DC
  Administered 2020-03-16 – 2020-04-16 (×63): 0.5 mg via RESPIRATORY_TRACT
  Filled 2020-03-16 (×62): qty 2

## 2020-03-16 MED ORDER — ARFORMOTEROL TARTRATE 15 MCG/2ML IN NEBU
15.0000 ug | INHALATION_SOLUTION | Freq: Two times a day (BID) | RESPIRATORY_TRACT | Status: DC
  Administered 2020-03-16 – 2020-04-16 (×63): 15 ug via RESPIRATORY_TRACT
  Filled 2020-03-16 (×63): qty 2

## 2020-03-16 NOTE — Progress Notes (Signed)
NAME:  Veronica Horton, MRN:  [id number], DOB:  04-May-1960, LOS: 6 ADMISSION DATE:  03/10/2020, CONSULTATION DATE:  03/16/20 REFERRING MD:  Hospitalist, CHIEF COMPLAINT:  Respiratory failure   Brief History   59 y.o. F with PMH of COPD 11/10 with shortness of breath found to have laryngeal mass concerning for malignancy s/p biopsy and tracheostomy who was transferred to the intensive care unit as she required ventilator support.  History of present illness   Veronica Horton is a 59y/o F with PMH of COPD on 5L home O2, DDD and HTN who presented on 11/10 for shortness of breath and was treated initially for a COPD exacerbation on bipap. She underwent SLP evaluation and barium that demonstrated supraglottic soft tissue mass. CT of the neck was performed which demonstrated a bulky soft tissue mass in the supraglottis with narrowing of the airway, involvement of the right laryngeal cartilage with extension to the strap muscles and the right paraglottic space, questionable bilateral cervical adenopathy concerning for regional metastatic disease.  She underwent direct laryngoscopy with biopsy and tracheostomy on 11/13 and required transfer to intensive care.  Oncology was consulted and given she is multiple to get a PET scan while inpatient CT chest/abdomen/pelvis with contrast was ordered.  Recommendations from head and neck cancer specialist are pending and patient may need transfer to [facility].  Past Medical History   has a past medical history of Bronchitis, Class 3 obesity (01/23/2020), COPD (chronic obstructive pulmonary disease) ([place]), Degenerative disc disease, lumbar, Hiatal hernia, and Hypertension.  Significant Hospital Events   11/10 to hospitalists 11/13 laryngoscopy and biopsy with tracheostomy 11/15 transferred to intensive care unit 11/15 transition to trach collar  Consults:  ENT Oncology PCCM  Procedures:  11/13 tracheostomy by ENT  Significant Diagnostic Tests:  11/11  CT soft tissue neck with contrast>>Bulky bilateral supraglottic tumor with possible airway Compromise, Involvement of right laryngeal cartilages, with extension in the midline anterior to the strap muscles, and also extension through the right paraglottic space and/or inseparable malignant right level IIIa lymph node.  11/15 CT chest/abdomen/pelvis>> no signs of metastatic disease, rounded partially necrotic lymph node to the left neck, basilar consolidative changes similar to prior, small pneumomediastinum  Micro Data:  11/10 Covid-19, influenza>> negative 11/10 MRSA>> give 11/14 sputum culture>> Serratia >>   Antimicrobials:  Doxycycline 11/10-11/13 Levofloxacin 11/14-  Interim history/subjective:   Placed on trach collar yesterday and she tolerated all day, placed back on vent at night Hemodynamically stable Afebrile Denies pain or dyspnea  Objective   Blood pressure (!) 114/59, pulse 69, temperature 98 F (36.7 C), resp. rate 18, height 5\' 5"  (1.651 m), weight 112.4 kg, SpO2 95 %.    Vent Mode: PSV;CPAP FiO2 (%):  [40 %-60 %] 60 % Set Rate:  [14 bmp] 14 bmp Vt Set:  [450 mL] 450 mL PEEP:  [8 cmH20] 8 cmH20 Pressure Support:  [5 cmH20] 5 cmH20 Plateau Pressure:  [12 cmH20-16 cmH20] 15 cmH20   Intake/Output Summary (Last 24 hours) at 03/16/2020 0935 Last data filed at 03/16/2020 0800 Gross per 24 hour  Intake 1754.9 ml  Output 799 ml  Net 955.9 ml   Filed Weights   03/10/20 1645 03/14/20 0500 03/16/20 0500  Weight: 110.7 kg 111.7 kg 112.4 kg    Gen:      elderly woman, no distress , on 40% trach collar HEENT:  EOMI, sclera anicteric, mild pallor Neck:     No JVD; no thyromegaly Lungs:   Decreased  breath sounds bilateral no accessory muscle use CV:         Regular rate and rhythm; no murmurs Abd:      + bowel sounds; soft, non-tender; no palpable masses, no distension Ext:    No edema; adequate peripheral perfusion Skin:      Warm and dry; no rash Neuro: alert  and oriented x 3 , nonfocal, interactive  Chest x-ray 11/14 independently reviewed which shows left upper lobe airspace disease, no effusions.  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  Aspiration -Await sensitivities, continue Levaquin plan 7 to 10 days  GERD -on PPI   HTN -Currently controlled without home Lasix or lisinopril   Mild AKI , improving  P: -Follow renal indices and urine output and avoid nephrotoxins  Best practice:  Diet: NPO Pain/Anxiety/Delirium protocol (if indicated): Fentanyl prn VAP protocol (if indicated): HOB 30 degrees suction as needed  DVT prophylaxis: Lovenox GI prophylaxis: PPI Glucose control: SSI Mobility: Out of bed as tolerated with PT Code Status: Full code Family Communication:pt Disposition: ICU  Labs   CBC: Recent Labs  Lab 03/10/20 1227 03/10/20 1227 03/12/20 0352 03/13/20 0849 03/14/20 0824 03/15/20 0105 03/16/20 0613  WBC 10.1   < > 18.7* 10.2 10.6* 8.2 6.9  NEUTROABS 8.1*  --   --   --   --  7.3 5.5  HGB 14.5   < > 15.0 15.0 15.5* 14.6 14.1  HCT 47.2*   < > 48.4* 47.4* 47.8* 45.4 44.8  MCV 101.9*   < > 100.6* 97.1 98.0 98.3 98.2  PLT 220   < > 248 208 203 146* 121*   < > = values in this interval not displayed.    Basic Metabolic Panel: Recent Labs  Lab  03/10/20 1226 03/10/20 1600 03/12/20 0352 03/13/20 0849 03/14/20 0824 03/15/20 0105 03/15/20 1621 03/15/20 1728 03/16/20 0613  NA   < >  --  140 140 141 141  --   --  144  K   < >  --  4.0 4.3 4.5 4.3  --   --  3.9  CL   < >  --  95* 98 101 104  --   --  105  CO2   < >  --  32 32 28 30  --   --  30  GLUCOSE   < >  --  131* 103* 112* 108*  --   --  126*  BUN   < >  --  33* 37* 39* 37*  --   --  35*  CREATININE   < >  --  1.10* 1.16* 1.10* 1.20*  --   --  1.13*  CALCIUM   < >  --  9.7 9.3 8.9 8.6*  --   --  8.8*  MG  --  2.3  --   --   --   --  2.7* 2.7* 2.6*  PHOS  --   --   --   --   --   --  3.2 3.4 3.2   < > = values in this interval not displayed.   GFR: Estimated Creatinine Clearance: 66.2 mL/min (A) (by C-G formula based on SCr of 1.13 mg/dL (H)). Recent Labs  Lab 03/10/20 1227 03/10/20 1405 03/12/20 0352 03/13/20 0849 03/14/20 0824 03/15/20 0105 03/16/20 0613  WBC 10.1  --    < > 10.2 10.6* 8.2 6.9  LATICACIDVEN 0.6 0.6  --   --   --   --   --    < > = values in this interval not displayed.    Liver Function Tests: Recent Labs  Lab 03/10/20 1226  AST 14*  ALT 12  ALKPHOS 79  BILITOT 0.8  PROT 7.3  ALBUMIN 3.6   No results for input(s): LIPASE, AMYLASE in the last 168 hours. No results for input(s): AMMONIA in the last 168 hours.  ABG    Component Value Date/Time   PHART 7.373 03/14/2020 0936   PCO2ART 55.4 (H) 03/14/2020 0936   PO2ART 108 03/14/2020 0936   HCO3 31.7 (H) 03/14/2020 0936   O2SAT 97.8 03/14/2020 0936      The patient is critically ill with multiple organ systems failure and requires high complexity decision making for assessment and support, frequent evaluation and titration of therapies, application of advanced monitoring technologies and extensive interpretation of multiple databases. Critical Care Time devoted to patient care services described in this note independent of APP/resident  time is 31 minutes.   Kara Mead MD.  [facility]. [facility] See Amion for pager  If no response to pager , please call [phone]  After 7:00 pm call Elink  [phone]   03/16/2020

## 2020-03-16 NOTE — Evaluation (Signed)
[facility]y-Muir Speaking Valve - Evaluation Patient Details  Name: Veronica Horton MRN: [id number] Date of Birth: [birth year]  Today's Date: 03/16/2020 Time: 1010-1030 SLP Time Calculation (min) (ACUTE ONLY): 20 min  Past Medical History:  Past Medical History:  Diagnosis Date  . Bronchitis   . Class 3 obesity 01/23/2020  . COPD (chronic obstructive pulmonary disease) ([place])   . Degenerative disc disease, lumbar   . Hiatal hernia   . Hypertension    Past Surgical History:  Past Surgical History:  Procedure Laterality Date  . CHOLECYSTECTOMY    . degenerative bone disease    . DIRECT LARYNGOSCOPY N/A 03/13/2020   Procedure: DIRECT LARYNGOSCOPY WITH BIOPSY;  Surgeon: Jason Coop, DO;  Location: [facility];  Service: ENT;  Laterality: N/A;  . INCISIONAL HERNIA REPAIR N/A 01/15/2019   Procedure: [name] HERNIORRHAPHY WITH MESH;  Surgeon: [name], MD;  Location: [facility];  Service: General;  Laterality: N/A;  . OMENTECTOMY N/A 01/15/2019   Procedure: OMENTECTOMY;  Surgeon: [name], MD;  Location: [facility];  Service: General;  Laterality: N/A;  . TRACHEOSTOMY TUBE PLACEMENT N/A 03/13/2020   Procedure: AWAKE TRACHEOSTOMY;  Surgeon: Jason Coop, DO;  Location: [facility];  Service: ENT;  Laterality: N/A;   HPI:  Veronica Horton is a 59 y/o F with history of COPD,chronic respiratory failure on home oxygen at 5 L/min, with large laryngeal mass lesion, now s/p tracheostomy with Shiley 6-0 and direct laryngoscopy and biopsy. 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   Assessment / Plan / Recommendation Clinical Impression  Initial PMV evaluation complete. Patient alert, comfortable at baseline. Cuff deflated with onset of coughing, expectoration of thin clear oral secretions, and drop in O2 saturation levels to mid 80s which lasted approximately 10 minutes. Requested tracheal suctioning from RN with some improvement. Combination of tracheal and independent oral  expectoration of secretions resulted in eventual calm state with return of O2 to low 90s. Patinet able to achieve evidence of minimal positive upper airway patency with finger occlusion (eventual coughing) but without ability to produce audible phonation. Given that patient reports she was able to phonate prior to tracheostomy, suspect that combination of mass, trach size, and edema may all be contributing. No PMV placement attempted today given performance. Will continue to f/u.  SLP Visit Diagnosis: Aphonia (R49.1)    SLP Assessment  Patient needs continued Speech Lanaguage Pathology Services       Frequency and Duration min 3x week  2 weeks    PMSV Trial PMSV was placed for: n/a   Tracheostomy Tube  Additional Tracheostomy Tube Assessment Fenestrated: No Trach Collar Period: none yet Secretion Description: minimal, thin Level of Secretion Expectoration: Tracheal;Oral    Vent Dependency  Vent Dependent: No Vent Mode: (S) PSV;CPAP PEEP: 8 cmH20 Pressure Support: 5 cmH20 FiO2 (%): 60 %    Cuff Deflation Trial  GO Tolerated Cuff Deflation: Yes Length of Time for Cuff Deflation Trial: 15 minutes Behavior: Alert;Controlled;Cooperative Cuff Deflation Trial - Comments: see notes     Veronica Howington MA, CCC-SLP     Veronica Horton Meryl 03/16/2020, 10:40 AM

## 2020-03-16 NOTE — Progress Notes (Signed)
Pt placed on 60% ATC and is tolerating well at this time. RN aware. RT to continue to monitor.

## 2020-03-17 ENCOUNTER — Inpatient Hospital Stay (HOSPITAL_COMMUNITY): Payer: Medicaid Other

## 2020-03-17 LAB — CBC WITH DIFFERENTIAL/PLATELET
Abs Immature Granulocytes: 0.09 10*3/uL — ABNORMAL HIGH (ref 0.00–0.07)
Basophils Absolute: 0 10*3/uL (ref 0.0–0.1)
Basophils Relative: 0 %
Eosinophils Absolute: 0 10*3/uL (ref 0.0–0.5)
Eosinophils Relative: 0 %
HCT: 46.2 % — ABNORMAL HIGH (ref 36.0–46.0)
Hemoglobin: 14.5 g/dL (ref 12.0–15.0)
Immature Granulocytes: 1 %
Lymphocytes Relative: 11 %
Lymphs Abs: 1 10*3/uL (ref 0.7–4.0)
MCH: 31.1 pg (ref 26.0–34.0)
MCHC: 31.4 g/dL (ref 30.0–36.0)
MCV: 99.1 fL (ref 80.0–100.0)
Monocytes Absolute: 1 10*3/uL (ref 0.1–1.0)
Monocytes Relative: 11 %
Neutro Abs: 7.4 10*3/uL (ref 1.7–7.7)
Neutrophils Relative %: 77 %
Platelets: 112 10*3/uL — ABNORMAL LOW (ref 150–400)
RBC: 4.66 MIL/uL (ref 3.87–5.11)
RDW: 14.8 % (ref 11.5–15.5)
WBC: 9.5 10*3/uL (ref 4.0–10.5)
nRBC: 0 % (ref 0.0–0.2)

## 2020-03-17 LAB — BASIC METABOLIC PANEL
Anion gap: 9 (ref 5–15)
BUN: 31 mg/dL — ABNORMAL HIGH (ref 6–20)
CO2: 32 mmol/L (ref 22–32)
Calcium: 8.6 mg/dL — ABNORMAL LOW (ref 8.9–10.3)
Chloride: 104 mmol/L (ref 98–111)
Creatinine, Ser: 1.1 mg/dL — ABNORMAL HIGH (ref 0.44–1.00)
GFR, Estimated: 58 mL/min — ABNORMAL LOW (ref >60)
Glucose, Bld: 116 mg/dL — ABNORMAL HIGH (ref 70–99)
Potassium: 4.3 mmol/L (ref 3.5–5.1)
Sodium: 145 mmol/L (ref 135–145)

## 2020-03-17 LAB — GLUCOSE, CAPILLARY
Glucose-Capillary: 125 mg/dL — ABNORMAL HIGH (ref 70–99)
Glucose-Capillary: 136 mg/dL — ABNORMAL HIGH (ref 70–99)
Glucose-Capillary: 143 mg/dL — ABNORMAL HIGH (ref 70–99)
Glucose-Capillary: 111 mg/dL — ABNORMAL HIGH (ref 70–99)
Glucose-Capillary: 131 mg/dL — ABNORMAL HIGH (ref 70–99)
Glucose-Capillary: 165 mg/dL — ABNORMAL HIGH (ref 70–99)

## 2020-03-17 LAB — MAGNESIUM: Magnesium: 2.6 mg/dL — ABNORMAL HIGH (ref 1.7–2.4)

## 2020-03-17 LAB — PHOSPHORUS: Phosphorus: 3.1 mg/dL (ref 2.5–4.6)

## 2020-03-17 MED ORDER — GLYCOPYRROLATE 0.2 MG/ML IJ SOLN
0.1000 mg | Freq: Once | INTRAMUSCULAR | Status: AC
  Administered 2020-03-17: 0.1 mg via INTRAVENOUS
  Filled 2020-03-17: qty 1

## 2020-03-17 NOTE — Progress Notes (Signed)
RT assisted in vent pt. Transport to MRI and back to 4G65 without complications.

## 2020-03-17 NOTE — Progress Notes (Signed)
Patient placed on [facility] vent for transport to [facility].

## 2020-03-17 NOTE — Progress Notes (Signed)
[facility] Progress Note Patient Name: JALEN OBERRY DOB: 11-28-1960 MRN: [id number]   Date of Service  03/17/2020  HPI/Events of Note  Notified of growth of Serratia from tracheal aspirate and inquiry if contact precaution necessary  eICU Interventions  Sensitivities reviewed and not an MDR organism hence contact precaution not necessary     Intervention Category Intermediate Interventions: Other:  Judd Lien 03/17/2020, 5:06 AM

## 2020-03-17 NOTE — Progress Notes (Signed)
OT Cancellation Note  Patient Details Name: Veronica Horton MRN: [id number] DOB: Apr 09, 1961   Cancelled Treatment:    Reason Eval/Treat Not Completed: Patient at procedure or test/ unavailable  Malka So 03/17/2020, 11:05 AM  Nestor Lewandowsky, OTR/L Acute Rehabilitation Services Pager: [phone] Office: [phone]

## 2020-03-17 NOTE — Progress Notes (Signed)
Physical Therapy Treatment Patient Details Name: Veronica Horton MRN: [id number] DOB: [birth year] Today's Date: 03/17/2020    History of Present Illness Ms. Galeno is a 59 year old female with medical history significant for COPD with chronic hypoxic respiratory failure on 5 L, HTN, tobacco use, obesity class 3 who presented on 11/10 with worsening shortness of breath over the past 4 days and increased confusion. Pt found to be in COPD exacerbation, hospital course at [facility] complicated by dysphasia, pt found to have a laryngeal mass. Pt transferred to [facility]. Pt underwent trach placement on 11/13 s/p layrngeal mass biopsy, went on vent 11/14 in AM due to desaturation..    PT Comments    Pt admitted with above diagnosis. Pt was able to ambulate with RW with min guard to min assist with RT assisting with vent management as pt on vent with trach.  Sats maintained during walk and other VSS.  Pt fatigued at end of walk but tolerated well.  Pt currently with functional limitations due to balance and endurance deficits. Pt will benefit from skilled PT to increase their independence and safety with mobility to allow discharge to the venue listed below.     Follow Up Recommendations  Home health PT;Supervision/Assistance - 24 hour     Equipment Recommendations  None recommended by PT    Recommendations for Other Services       Precautions / Restrictions Precautions Precautions: Fall Precaution Comments: Vent with trach, cortrak Restrictions Weight Bearing Restrictions: No    Mobility  Bed Mobility Overal bed mobility: Needs Assistance Bed Mobility: Supine to Sit;Sit to Supine     Supine to sit: Min assist Sit to supine: Supervision   General bed mobility comments: HOB up, increased time, assist to initiate LEs over EOB and to raise trunk, pt uses momentum to get her LEs back into bed  Transfers Overall transfer level: Needs assistance Equipment used: Rolling walker (2  wheeled) Transfers: Sit to/from Stand Sit to Stand: Min guard;+2 safety/equipment         General transfer comment: min guard for safety and line management  Ambulation/Gait Ambulation/Gait assistance: Min guard;Min assist Gait Distance (Feet): 150 Feet Assistive device: Rolling walker (2 wheeled) Gait Pattern/deviations: Decreased step length - right;Decreased step length - left;Decreased stride length;Trunk flexed Gait velocity: decreased Gait velocity interpretation: <1.31 ft/sec, indicative of household ambulator General Gait Details: RT pushed vent and PT and OT assisted with pt to ambulate in hallway with RW.  Pt FiO2 -100%, PEEP 8, with sats >93%.  HR 71-80 bpm, RR 23-30.  Tolerated well overall.    Stairs             Wheelchair Mobility    Modified Rankin (Stroke Patients Only)       Balance Overall balance assessment: Needs assistance Sitting-balance support: Feet unsupported;No upper extremity supported Sitting balance-Leahy Scale: Good     Standing balance support: Bilateral upper extremity supported Standing balance-Leahy Scale: Poor Standing balance comment: dependent on B UE support on RW                            Cognition Arousal/Alertness: Awake/alert Behavior During Therapy: WFL for tasks assessed/performed Overall Cognitive Status: Within Functional Limits for tasks assessed  Exercises General Exercises - Lower Extremity Ankle Circles/Pumps: AROM;Both;10 reps;Supine Heel Slides: AROM;Both;10 reps;Supine    General Comments General comments (skin integrity, edema, etc.): On 100% vent support for the walk and after walk stayed on this.       Pertinent Vitals/Pain Pain Assessment: Faces Faces Pain Scale: Hurts little more Pain Location: LEs with touch Pain Descriptors / Indicators: Grimacing;Guarding Pain Intervention(s): Limited activity within patient's  tolerance;Monitored during session;Repositioned    Home Living                      Prior Function            PT Goals (current goals can now be found in the care plan section) Acute Rehab PT Goals Patient Stated Goal: to eat Progress towards PT goals: Progressing toward goals    Frequency    Min 3X/week      PT Plan Current plan remains appropriate    Co-evaluation PT/OT/SLP Co-Evaluation/Treatment: Yes Reason for Co-Treatment: Complexity of the patient's impairments (multi-system involvement);For patient/therapist safety PT goals addressed during session: Mobility/safety with mobility OT goals addressed during session: Proper use of Adaptive equipment and DME      AM-PAC PT "6 Clicks" Mobility   Outcome Measure  Help needed turning from your back to your side while in a flat bed without using bedrails?: None Help needed moving from lying on your back to sitting on the side of a flat bed without using bedrails?: None Help needed moving to and from a bed to a chair (including a wheelchair)?: A Little Help needed standing up from a chair using your arms (e.g., wheelchair or bedside chair)?: A Little Help needed to walk in hospital room?: A Little Help needed climbing 3-5 steps with a railing? : A Lot 6 Click Score: 19    End of Session Equipment Utilized During Treatment: Oxygen;Gait belt Activity Tolerance: Patient tolerated treatment well Patient left: in bed;with bed alarm set;with call bell/phone within reach Nurse Communication: Mobility status PT Visit Diagnosis: Unsteadiness on feet (R26.81);Other abnormalities of gait and mobility (R26.89);Muscle weakness (generalized) (M62.81)     Time: 7341-9379 PT Time Calculation (min) (ACUTE ONLY): 24 min  Charges:  $Gait Training: 8-22 mins                     Hasana Alcorta W,PT [place] Pager:  [phone]  Office:  [place] 03/17/2020, 2:08 PM

## 2020-03-17 NOTE — Progress Notes (Signed)
Occupational Therapy Treatment Patient Details Name: Veronica Horton MRN: [id number] DOB: 05-Oct-1960 Today's Date: 03/17/2020    History of present illness Veronica Horton is a 59 year old female with medical history significant for COPD with chronic hypoxic respiratory failure on 5 L, HTN, tobacco use, obesity class 3 who presented on 11/10 with worsening shortness of breath over the past 4 days and increased confusion. Pt found to be in COPD exacerbation, hospital course at [facility] complicated by dysphasia, pt found to have a laryngeal mass. Pt transferred to [facility]. Pt underwent trach placement on 11/13 s/p layrngeal mass biopsy, went on vent 11/14 in AM due to desaturation..   OT comments  Pt remains highly motivated. Willing to work with therapies on OOB on ventilator with RT support and bed level exercises despite testing this morning and plan for move to [employer] as soon as this afternoon. Min assist to achieve sitting EOB, stood and ambulated with RW and chair follow with min guard assist. Pt needing moderate assistance for UB dressing and total assist for LB.  Sp02 remained >94% on vent support (100%FiO).   Follow Up Recommendations  Home health OT;Supervision/Assistance - 24 hour    Equipment Recommendations  3 in 1 bedside commode    Recommendations for Other Services      Precautions / Restrictions Precautions Precautions: Fall Precaution Comments: Vent with trach, cortrak       Mobility Bed Mobility Overal bed mobility: Needs Assistance Bed Mobility: Supine to Sit;Sit to Supine     Supine to sit: Min assist Sit to supine: Supervision   General bed mobility comments: HOB up, increased time, assist to initiate LEs over EOB and to raise trunk, pt uses momentum to get her LEs back into bed  Transfers Overall transfer level: Needs assistance Equipment used: Rolling walker (2 wheeled) Transfers: Sit to/from Stand Sit to Stand: Min guard;+2 safety/equipment          General transfer comment: min guard for safety and line management    Balance Overall balance assessment: Needs assistance Sitting-balance support: Feet unsupported;No upper extremity supported Sitting balance-Leahy Scale: Good     Standing balance support: Bilateral upper extremity supported Standing balance-Leahy Scale: Poor Standing balance comment: dependent on B UE support on RW                           ADL either performed or assessed with clinical judgement   ADL Overall ADL's : Needs assistance/impaired                 Upper Body Dressing : Moderate assistance;Sitting   Lower Body Dressing: Total assistance;Bed level                       Vision       Perception     Praxis      Cognition Arousal/Alertness: Awake/alert Behavior During Therapy: WFL for tasks assessed/performed Overall Cognitive Status: Within Functional Limits for tasks assessed                                          Exercises     Shoulder Instructions       General Comments      Pertinent Vitals/ Pain       Pain Assessment: Faces Faces Pain Scale: Hurts little more Pain Location:  LEs with touch Pain Descriptors / Indicators: Grimacing;Guarding Pain Intervention(s): Monitored during session;Repositioned  Home Living                                          Prior Functioning/Environment              Frequency  Min 2X/week        Progress Toward Goals  OT Goals(current goals can now be found in the care plan section)  Progress towards OT goals: Progressing toward goals  Acute Rehab OT Goals Patient Stated Goal: to eat OT Goal Formulation: With patient Time For Goal Achievement: 03/29/20 Potential to Achieve Goals: Good  Plan Discharge plan remains appropriate    Co-evaluation    PT/OT/SLP Co-Evaluation/Treatment: Yes Reason for Co-Treatment: Complexity of the patient's impairments (multi-system  involvement)   OT goals addressed during session: Proper use of Adaptive equipment and DME      AM-PAC OT "6 Clicks" Daily Activity     Outcome Measure   Help from another person eating meals?: Total Help from another person taking care of personal grooming?: A Little Help from another person toileting, which includes using toliet, bedpan, or urinal?: A Little Help from another person bathing (including washing, rinsing, drying)?: A Lot Help from another person to put on and taking off regular upper body clothing?: A Little Help from another person to put on and taking off regular lower body clothing?: A Lot 6 Click Score: 14    End of Session Equipment Utilized During Treatment: Gait belt;Rolling walker;Other (comment) (RT managed vent)  OT Visit Diagnosis: Unsteadiness on feet (R26.81);Muscle weakness (generalized) (M62.81)   Activity Tolerance Patient tolerated treatment well   Patient Left in bed;with call bell/phone within reach   Nurse Communication Mobility status        Time: 0258-5277 OT Time Calculation (min): 24 min  Charges: OT General Charges $OT Visit: 1 Visit OT Treatments $Therapeutic Activity: 8-22 mins  Nestor Lewandowsky, OTR/L Acute Rehabilitation Services Pager: [phone] Office: [phone]   Malka So 03/17/2020, 1:45 PM

## 2020-03-17 NOTE — Progress Notes (Signed)
Pt could not tolerate MRI, became anxious and asking to get out. Pt refused offer of nurse to ask MD for anxiety med. Returned to unit. Dr. Elsworth Soho aware. Pt imminently transferring to [facility] ICU.Ellamae Sia

## 2020-03-17 NOTE — Progress Notes (Signed)
[place] Progress Note Patient Name: Veronica Horton DOB: 1960/05/19 MRN: [id number]   Date of Service  03/17/2020  HPI/Events of Note  Patient with copious airway secretions causing the ventilator to "pop off".  eICU Interventions  Robinul 0.1 mg iv x 1 ordered.        Kerry Kass Colm Lyford 03/17/2020, 9:34 PM

## 2020-03-17 NOTE — Progress Notes (Signed)
Oncology brief note   We discussed her case in ENT tumor board this morning, will proceed with concurrent chemoradiation.  I have ordered neck MRI w wo contrast to be done asap for staging and radiation planning.   Please arrange her to be transferred to [facility] today or tomorrow.  Truitt Merle  03/17/2020

## 2020-03-17 NOTE — Progress Notes (Signed)
[facility] at the bedside to transport patient to [facility] ICU . Pt's sister [name] and  pts significant other informed. Ellamae Sia

## 2020-03-17 NOTE — Progress Notes (Signed)
RT assisted PT with pt. walking in the unit on the vent without complications.

## 2020-03-17 NOTE — Progress Notes (Signed)
NAME:  Veronica Horton, MRN:  [id number], DOB:  05-27-1960, LOS: 7 ADMISSION DATE:  03/10/2020, CONSULTATION DATE:  03/17/20 REFERRING MD:  Hospitalist, CHIEF COMPLAINT:  Respiratory failure   Brief History   59 y.o. F with PMH of COPD 11/10 with shortness of breath found to have laryngeal mass concerning for malignancy s/p biopsy and tracheostomy who was transferred to the intensive care unit as she required ventilator support.  History of present illness   Veronica Horton is a 59y/o F with PMH of COPD on 5L home O2, DDD and HTN who presented on 11/10 for shortness of breath and was treated initially for a COPD exacerbation on bipap. She underwent SLP evaluation and barium that demonstrated supraglottic soft tissue mass. CT of the neck was performed which demonstrated a bulky soft tissue mass in the supraglottis with narrowing of the airway, involvement of the right laryngeal cartilage with extension to the strap muscles and the right paraglottic space, questionable bilateral cervical adenopathy concerning for regional metastatic disease.  She underwent direct laryngoscopy with biopsy and tracheostomy on 11/13 and required transfer to intensive care.  Oncology was consulted and given she is multiple to get a PET scan while inpatient CT chest/abdomen/pelvis with contrast was ordered.  Recommendations from head and neck cancer specialist are pending and patient may need transfer to [facility].  Past Medical History   has a past medical history of Bronchitis, Class 3 obesity (01/23/2020), COPD (chronic obstructive pulmonary disease) ([place]), Degenerative disc disease, lumbar, Hiatal hernia, and Hypertension.  Significant Hospital Events   11/10 to hospitalists 11/13 laryngoscopy and biopsy with tracheostomy 11/15 transferred to intensive care unit 11/15 transition to trach collar  Consults:  ENT Oncology PCCM  Procedures:  11/13 tracheostomy by ENT  Significant Diagnostic Tests:  11/11  CT soft tissue neck with contrast>>Bulky bilateral supraglottic tumor with possible airway Compromise, Involvement of right laryngeal cartilages, with extension in the midline anterior to the strap muscles, and also extension through the right paraglottic space and/or inseparable malignant right level IIIa lymph node.  11/15 CT chest/abdomen/pelvis>> no signs of metastatic disease, rounded partially necrotic lymph node to the left neck, basilar consolidative changes similar to prior, small pneumomediastinum  Micro Data:  11/10 Covid-19, influenza>> negative 11/10 MRSA>> give 11/14 sputum culture>> Serratia >>   Antimicrobials:  Doxycycline 11/10-11/13 Levofloxacin 11/14-  Interim history/subjective:   Stayed on trach collar last 24 hours, more hypoxic this morning, FiO2 turned up to 100% Denies chest pain or dyspnea Afebrile  Objective   Blood pressure (!) 110/59, pulse 82, temperature 98 F (36.7 C), temperature source Oral, resp. rate (!) 23, height 5\' 5"  (1.651 m), weight 112.5 kg, SpO2 90 %.    FiO2 (%):  [60 %-98 %] 98 %   Intake/Output Summary (Last 24 hours) at 03/17/2020 0918 Last data filed at 03/17/2020 0800 Gross per 24 hour  Intake 1109.5 ml  Output 900 ml  Net 209.5 ml   Filed Weights   03/14/20 0500 03/16/20 0500 03/17/20 0500  Weight: 111.7 kg 112.4 kg 112.5 kg    Gen:      elderly woman, no distress , on 100% trach collar HEENT:  EOMI, sclera anicteric, mild pallor Neck:     No JVD; no thyromegaly Lungs:   No accessory muscle use, decreased breath sounds bilateral CV:         Regular rate and rhythm; no murmurs Abd:      + bowel sounds; soft, non-tender; no palpable  masses, no distension Ext:    No edema; adequate peripheral perfusion Skin:      Warm and dry; no rash Neuro: alert and oriented x 3 , nonfocal, interactive  Chest x-ray 11/16 independently reviewed -increased left lower lobe airspace disease versus atelectasis/effusion  Labs show no  leukocytosis, stable thrombocytopenia, normal electrolytes   Resolved Hospital Problem list     Assessment & Plan:   Acute hypoxic respiratory failure Status post 6-0 Shiley tracheostomy (ENT )  - COPD on 5L home O2 Serratia HAP?  Aspiration  -Hypoxia appears worse which may be related to worsening consolidation/atelectasis. 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  P: -ENT and oncology following, plan is laryngectomy versus chemoradiation Per the recommendation after discussing in tumor board, transferred to [facility]   GERD -on PPI   HTN -Currently controlled without home Lasix or lisinopril   Mild AKI , improving P: -Follow renal indices and urine output and avoid nephrotoxins  Best practice:  Diet: NPO Pain/Anxiety/Delirium protocol (if indicated): Fentanyl prn VAP protocol (if indicated): HOB 30 degrees suction as needed  DVT prophylaxis: Lovenox GI prophylaxis: PPI Glucose control: SSI Mobility: Out of bed as tolerated with PT Code Status: Full code Family Communication:pt Disposition: Stay in ICU due to worsening hypoxia  Labs   CBC: Recent Labs  Lab 03/10/20 1227 03/12/20 0352 03/13/20 0849 03/14/20 0824 03/15/20 0105 03/16/20 0613 03/17/20 0245  WBC 10.1   < > 10.2 10.6* 8.2 6.9 9.5  NEUTROABS 8.1*  --   --   --  7.3 5.5 7.4  HGB 14.5   < > 15.0 15.5* 14.6 14.1 14.5  HCT 47.2*   < > 47.4* 47.8* 45.4 44.8 46.2*  MCV 101.9*   < > 97.1 98.0 98.3 98.2 99.1  PLT 220   < > 208 203 146* 121* 112*   < > = values in this interval not displayed.    Basic Metabolic Panel: Recent Labs  Lab 03/10/20 1600 03/12/20 0352 03/13/20 0849 03/14/20 0824 03/15/20 0105 03/15/20 1621 03/15/20 1728 03/16/20 0613 03/17/20 0245   NA  --    < > 140 141 141  --   --  144 145  K  --    < > 4.3 4.5 4.3  --   --  3.9 4.3  CL  --    < > 98 101 104  --   --  105 104  CO2  --    < > 32 28 30  --   --  30 32  GLUCOSE  --    < > 103* 112* 108*  --   --  126* 116*  BUN  --    < > 37* 39* 37*  --   --  35* 31*  CREATININE  --    < > 1.16* 1.10* 1.20*  --   --  1.13* 1.10*  CALCIUM  --    < > 9.3 8.9 8.6*  --   --  8.8* 8.6*  MG 2.3  --   --   --   --  2.7* 2.7* 2.6* 2.6*  PHOS  --   --   --   --   --  3.2 3.4 3.2 3.1   < > = values  in this interval not displayed.   GFR: Estimated Creatinine Clearance: 68 mL/min (A) (by C-G formula based on SCr of 1.1 mg/dL (H)). Recent Labs  Lab 03/10/20 1227 03/10/20 1405 03/12/20 0352 03/14/20 0824 03/15/20 0105 03/16/20 0613 03/17/20 0245  WBC 10.1  --    < > 10.6* 8.2 6.9 9.5  LATICACIDVEN 0.6 0.6  --   --   --   --   --    < > = values in this interval not displayed.    Liver Function Tests: Recent Labs  Lab 03/10/20 1226  AST 14*  ALT 12  ALKPHOS 79  BILITOT 0.8  PROT 7.3  ALBUMIN 3.6   No results for input(s): LIPASE, AMYLASE in the last 168 hours. No results for input(s): AMMONIA in the last 168 hours.  ABG    Component Value Date/Time   PHART 7.373 03/14/2020 0936   PCO2ART 55.4 (H) 03/14/2020 0936   PO2ART 108 03/14/2020 0936   HCO3 31.7 (H) 03/14/2020 0936   O2SAT 97.8 03/14/2020 0936     The patient is critically ill with multiple organ systems failure and requires high complexity decision making for assessment and support, frequent evaluation and titration of therapies, application of advanced monitoring technologies and extensive interpretation of multiple databases. Critical Care Time devoted to patient care services described in this note independent of APP/resident  time is 32 minutes.    Kara Mead MD. [place] Flood. [facility] See Amion for pager  If no response to pager , please call [phone]  After 7:00 pm call Elink   [phone]   03/17/2020

## 2020-03-18 ENCOUNTER — Inpatient Hospital Stay (HOSPITAL_COMMUNITY): Payer: Medicaid Other

## 2020-03-18 DIAGNOSIS — C329 Malignant neoplasm of larynx, unspecified: Secondary | ICD-10-CM

## 2020-03-18 DIAGNOSIS — J9602 Acute respiratory failure with hypercapnia: Secondary | ICD-10-CM

## 2020-03-18 DIAGNOSIS — Z930 Tracheostomy status: Secondary | ICD-10-CM

## 2020-03-18 DIAGNOSIS — C321 Malignant neoplasm of supraglottis: Secondary | ICD-10-CM

## 2020-03-18 LAB — GLUCOSE, CAPILLARY
Glucose-Capillary: 101 mg/dL — ABNORMAL HIGH (ref 70–99)
Glucose-Capillary: 116 mg/dL — ABNORMAL HIGH (ref 70–99)
Glucose-Capillary: 123 mg/dL — ABNORMAL HIGH (ref 70–99)
Glucose-Capillary: 129 mg/dL — ABNORMAL HIGH (ref 70–99)
Glucose-Capillary: 130 mg/dL — ABNORMAL HIGH (ref 70–99)
Glucose-Capillary: 135 mg/dL — ABNORMAL HIGH (ref 70–99)

## 2020-03-18 LAB — CBC
HCT: 45.9 % (ref 36.0–46.0)
Hemoglobin: 14.2 g/dL (ref 12.0–15.0)
MCH: 31.8 pg (ref 26.0–34.0)
MCHC: 30.9 g/dL (ref 30.0–36.0)
MCV: 102.7 fL — ABNORMAL HIGH (ref 80.0–100.0)
Platelets: 103 10*3/uL — ABNORMAL LOW (ref 150–400)
RBC: 4.47 MIL/uL (ref 3.87–5.11)
RDW: 15.2 % (ref 11.5–15.5)
WBC: 11.4 10*3/uL — ABNORMAL HIGH (ref 4.0–10.5)
nRBC: 0 % (ref 0.0–0.2)

## 2020-03-18 LAB — BASIC METABOLIC PANEL
Anion gap: 5 (ref 5–15)
BUN: 28 mg/dL — ABNORMAL HIGH (ref 6–20)
CO2: 32 mmol/L (ref 22–32)
Calcium: 8.5 mg/dL — ABNORMAL LOW (ref 8.9–10.3)
Chloride: 105 mmol/L (ref 98–111)
Creatinine, Ser: 0.91 mg/dL (ref 0.44–1.00)
GFR, Estimated: >60 mL/min (ref >60)
Glucose, Bld: 123 mg/dL — ABNORMAL HIGH (ref 70–99)
Potassium: 4.5 mmol/L (ref 3.5–5.1)
Sodium: 142 mmol/L (ref 135–145)

## 2020-03-18 LAB — SURGICAL PATHOLOGY

## 2020-03-18 LAB — MAGNESIUM: Magnesium: 2.8 mg/dL — ABNORMAL HIGH (ref 1.7–2.4)

## 2020-03-18 LAB — PHOSPHORUS: Phosphorus: 3.5 mg/dL (ref 2.5–4.6)

## 2020-03-18 MED ORDER — DOCUSATE SODIUM 50 MG/5ML PO LIQD
100.0000 mg | Freq: Two times a day (BID) | ORAL | Status: DC
  Administered 2020-03-18 – 2020-03-28 (×17): 100 mg
  Filled 2020-03-18 (×20): qty 10

## 2020-03-18 MED ORDER — SODIUM CHLORIDE 0.9 % IV SOLN: INTRAVENOUS | Status: DC | PRN

## 2020-03-18 MED ORDER — GUAIFENESIN-DM 100-10 MG/5ML PO SYRP
5.0000 mL | ORAL_SOLUTION | ORAL | Status: DC | PRN
  Administered 2020-03-18 – 2020-03-25 (×5): 5 mL
  Filled 2020-03-18 (×5): qty 10

## 2020-03-18 MED ORDER — ADULT MULTIVITAMIN W/MINERALS CH
1.0000 | ORAL_TABLET | Freq: Every day | ORAL | Status: DC
  Administered 2020-03-19 – 2020-03-28 (×9): 1
  Filled 2020-03-18 (×9): qty 1

## 2020-03-18 NOTE — Consult Note (Signed)
Chief Complaint: Patient was seen in consultation today for dysphagia/gastrostomy tube placement.  Referring Physician(s): Donita Brooks (CCM)  Supervising Physician: Arne Cleveland  Patient Status: [facility] - In-pt  History of Present Illness: Veronica Horton is a 59 y.o. female with a past medical history of hypertension, COPD, bronchitis, laryngeal cancer, hiatal hernia, obesity, and DDD. She presented to [facility] ED 03/10/2020 with complaints of dyspnea. She was admitted for what was presumed COPD exacerbation. Further work-up unfortunately revealed a laryngeal mass. 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 She was then transferred to [facility] for oncology management and has tentative plans to begin concurrent chemoradiation. She requires alternative measures (other than PO) for nutritional support due to tracheostomy/chemoradiation plans.  IR consulted by Noe Gens, NP for possible image-guided percutaneous gastrostomy tube placement. Patient awake and alert sitting in bed, tracheostomy in place. No speech valve but patient does nod appropriately to yes/no questions and does use paper/pen for communication. No complaints. Denies fever, chills, chest pain, dyspnea, abdominal pain, or headache.  LD Lovenox SQ 03/17/2020 at 1824.   Past Medical History:  Diagnosis Date  . Bronchitis   . Class 3 obesity 01/23/2020  . COPD (chronic obstructive pulmonary disease) ([place])   . Degenerative disc disease, lumbar   . Hiatal hernia   . Hypertension     Past Surgical History:  Procedure Laterality Date  . CHOLECYSTECTOMY    . degenerative bone disease    . DIRECT LARYNGOSCOPY N/A 03/13/2020   Procedure: DIRECT LARYNGOSCOPY WITH BIOPSY;  Surgeon: Jason Coop, DO;  Location: [place];  Service: ENT;  Laterality: N/A;  . INCISIONAL HERNIA REPAIR N/A 01/15/2019   Procedure: [name] HERNIORRHAPHY WITH MESH;  Surgeon: [name], MD;  Location: [facility];  Service: General;  Laterality: N/A;  . OMENTECTOMY N/A 01/15/2019   Procedure: OMENTECTOMY;  Surgeon: [name], MD;  Location: [facility];  Service: General;  Laterality: N/A;  . TRACHEOSTOMY TUBE PLACEMENT N/A 03/13/2020   Procedure: AWAKE TRACHEOSTOMY;  Surgeon: Jason Coop, DO;  Location: [place];  Service: ENT;  Laterality: N/A;    Allergies: Codeine and Penicillins  Medications: Prior to Admission medications   Medication Sig Start Date End Date Taking? Authorizing Provider  albuterol (PROVENTIL) (2.5 MG/3ML) 0.083% nebulizer solution Take 2.5 mg by nebulization every 6 (six) hours as needed for wheezing or shortness of breath.   Yes [provider]  albuterol (VENTOLIN HFA) 108 (90 Base) MCG/ACT inhaler Inhale 1-2 puffs into the lungs every 6 (six) hours as needed for wheezing or shortness of breath.   Yes [provider]  feeding supplement, ENSURE ENLIVE, (ENSURE ENLIVE) LIQD Take 237 mLs by mouth 2 (two) times daily between meals. 01/25/20  Yes Shah, Pratik D, DO  folic acid (FOLVITE) 1 MG tablet Take 1 tablet (1 mg total) by mouth daily. 01/31/19  Yes [name], MD  furosemide (LASIX) 40 MG tablet Take 1 tablet (40 mg total) by mouth daily. 01/30/19 03/14/20 Yes [name], MD  gabapentin (NEURONTIN) 300 MG capsule Take 600 mg by mouth 3 (three) times daily.  12/30/18  Yes [provider]  lisinopril (ZESTRIL) 20 MG tablet Take 1 tablet (20 mg total) by mouth daily. 01/31/19  Yes [name], MD  budesonide-formoterol [facility]) 160-4.5 MCG/ACT inhaler Inhale 2 puffs into the lungs 2 (two) times daily. Patient not taking: Reported on 03/14/2020 01/30/19   [name], MD  cyclobenzaprine (FLEXERIL) 10  MG tablet Take 10 mg by mouth daily as needed for muscle spasms. Patient not taking: Reported on 03/14/2020    [provider]  nicotine (NICODERM CQ - DOSED IN MG/24 HR) 7 mg/24hr patch Place 1 patch (7  mg total) onto the skin daily. Patient not taking: Reported on 03/14/2020 01/26/20   Heath Lark D, DO  saccharomyces boulardii (FLORASTOR) 250 MG capsule Take 1 capsule (250 mg total) by mouth 2 (two) times daily. Patient not taking: Reported on 01/23/2020 01/30/19   [name], MD  vitamin B-12 (CYANOCOBALAMIN) 500 MCG tablet Take 1 tablet (500 mcg total) by mouth daily. Patient not taking: Reported on 01/23/2020 01/31/19   [name], MD     Family History  Problem Relation Age of Onset  . Hypertension Mother   . Sudden Cardiac Death Neg Hx     Social History   Socioeconomic History  . Marital status: Divorced    Spouse name: Not on file  . Number of children: Not on file  . Years of education: Not on file  . Highest education level: Not on file  Occupational History  . Not on file  Tobacco Use  . Smoking status: Current Every Day Smoker    Packs/day: 1.00  . Smokeless tobacco: Never Used  Substance and Sexual Activity  . Alcohol use: No  . Drug use: No  . Sexual activity: Yes    Birth control/protection: Post-menopausal  Other Topics Concern  . Not on file  Social History Narrative  . Not on file   Social Determinants of Health   Financial Resource Strain:   . Difficulty of Paying Living Expenses: Not on file  Food Insecurity:   . Worried About Charity fundraiser in the Last Year: Not on file  . Ran Out of Food in the Last Year: Not on file  Transportation Needs:   . Lack of Transportation (Medical): Not on file  . Lack of Transportation (Non-Medical): Not on file  Physical Activity:   . Days of Exercise per Week: Not on file  . Minutes of Exercise per Session: Not on file  Stress:   . Feeling of Stress : Not on file  Social Connections:   . Frequency of Communication with Friends and Family: Not on file  . Frequency of Social Gatherings with Friends and Family: Not on file  . Attends Religious Services: Not on file  . Active Member of Clubs or  Organizations: Not on file  . Attends Archivist Meetings: Not on file  . Marital Status: Not on file     Review of Systems: A 12 point ROS discussed and pertinent positives are indicated in the HPI above.  All other systems are negative.  Review of Systems  Constitutional: Negative for chills and fever.  Respiratory: Negative for shortness of breath and wheezing.   Cardiovascular: Negative for chest pain and palpitations.  Gastrointestinal: Negative for abdominal pain.  Neurological: Negative for headaches.    Vital Signs: BP (!) 124/55   Pulse 72   Temp 98.3 F (36.8 C) (Oral)   Resp 12   Ht 5\' 5"  (1.651 m)   Wt 248 lb 0.3 oz (112.5 kg)   SpO2 (!) 89% Comment: RT aware  BMI 41.27 kg/m   Physical Exam Vitals and nursing note reviewed.  Constitutional:      General: She is not in acute distress.    Appearance: She is ill-appearing.     Comments: Tracheostomy.  Cardiovascular:  Rate and Rhythm: Normal rate and regular rhythm.     Heart sounds: Normal heart sounds. No murmur heard.   Pulmonary:     Effort: Pulmonary effort is normal. No respiratory distress.     Breath sounds: Normal breath sounds. No wheezing.     Comments: Tracheostomy. Skin:    General: Skin is warm and dry.  Neurological:     Mental Status: She is alert and oriented to person, place, and time.     Comments: No speech valve but nods appropriately to yes/no questions and uses pen/paper for communication.      MD Evaluation Airway:  (Tracheostomy.) Heart: WNL Abdomen: WNL Chest/ Lungs: WNL ASA  Classification: 3 Mallampati/Airway Score: Three (Tracheostomy.)   Imaging: CT SOFT TISSUE NECK W CONTRAST  Addendum Date: 03/11/2020   ADDENDUM REPORT: 03/11/2020 16:36 ADDENDUM: Study discussed by telephone with Dr. Jolaine Artist MEMON on 03/11/2020 at 1631 hours. Electronically Signed   By: Genevie Ann M.D.   On: 03/11/2020 16:36   Result Date: 03/11/2020 CLINICAL DATA:  59 year old  female with dysphagia. Tonsillitis suspected. EXAM: CT NECK WITH CONTRAST TECHNIQUE: Multidetector CT imaging of the neck was performed using the standard protocol following the bolus administration of intravenous contrast. CONTRAST:  55mL OMNIPAQUE IOHEXOL 300 MG/ML  SOLN COMPARISON:  Chest CT 01/28/2019. FINDINGS: Pharynx and larynx: Bulky soft tissue mass throughout the bilateral supraglottic larynx and affecting the hypopharynx. Lobulated diffuse thickening of the epiglottis (series 2, image 53). Diffuse false cord and anterior commissure involvement with evidence of extension into the right paraglottic space on series 2 image 61 - where nodular direct extension of tumor and/or inseparable abnormal right level 3 lymph node protrudes on coronal image 58. Asymmetric erosion of the undersurface of the right thyroid cartilage on image 65. Asymmetric sclerosis of the right arytenoid on image 67., and midline extension of tumor is suspected through the anterior commissure a distance of about 11 mm as seen on series 2, image 67 and sagittal image 48. All told, tumor size is estimated at 37 x 52 by 45 mm (AP by transverse by CC). There is evidence of airway compromise. The true cords may be spared as seen on series 2, image 71. Subglottic larynx is within normal limits. Above the vallecula pharyngeal contours appear normal. Normal superior parapharyngeal and retropharyngeal spaces. Salivary glands: Negative sublingual space. Submandibular glands and parotid glands remain within normal limits. Thyroid: Negative. Lymph nodes: Malignant left level IIIb lymph node measures 17 mm short axis and 29 mm long axis (series 2, image 55 and coronal image 73). As stated above it is possible of malignant right level IIIa lymph node is inseparable from the parent tumor along the right paraglottic space (coronal image 58). Smaller but asymmetrically enlarged left level 4 nodes measure up to 9 mm short axis (coronal image 72). No other  abnormal or suspicious lymph nodes identified. Vascular: Major vascular structures in the neck and at the skull base remain patent including both internal jugular veins. The left vertebral artery appears dominant. Calcified atherosclerosis at the skull base. Limited intracranial: Negative. Visualized orbits: Negative. Mastoids and visualized paranasal sinuses: Clear. Skeleton: Absent and carious dentition. Cervical spine degeneration. No suspicious osseous lesion identified. 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 No superior mediastinal lymphadenopathy. Visible axillary lymph nodes are normal. Mild dependent atelectasis.  No upper lung nodule identified. IMPRESSION: 1. 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 Recommend ENT consultation. Involvement of right laryngeal cartilages, with extension in  the midline anterior to the strap muscles, and also extension through the right paraglottic space and/or inseparable malignant right level IIIa lymph node. Estimated tumor long axis 5.2 cm. 2. Malignant contralateral left level 3b lymph node is 2.9 cm long axis. Smaller indeterminate left level 4 lymph nodes. 3. No distant metastatic disease identified in the neck or upper chest. Electronically Signed: By: Genevie Ann M.D. On: 03/11/2020 16:23   CT CHEST ABDOMEN PELVIS W CONTRAST  Result Date: 03/15/2020 CLINICAL DATA:  New diagnosis of head neck mass EXAM: CT CHEST, ABDOMEN, AND PELVIS WITH CONTRAST TECHNIQUE: Multidetector CT imaging of the chest, abdomen and pelvis was performed following the standard protocol during bolus administration of intravenous contrast. CONTRAST:  138mL OMNIPAQUE IOHEXOL 300 MG/ML  SOLN COMPARISON:  Prior swallowing evaluation and CT of the neck. FINDINGS: CT CHEST FINDINGS Cardiovascular: Calcified coronary artery disease. Aorta is normal caliber. 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 Central  pulmonary vasculature is mildly engorged. Approximately 3 cm greatest caliber unchanged from previous exam. Unremarkable on limited venous phase assessment. Mediastinum/Nodes: Supraglottic mass partially imaged on the first image of the data set. Interval placement of tracheostomy tube with resultant pneumomediastinum in the anterior mediastinum in upper mediastinum. No adenopathy within the mediastinum or axilla. No hilar adenopathy. Rounded partial necrotic lymph node in the LEFT neck partially visualized, corresponding to level IIIb lymph node seen on the neck CT. Lungs/Pleura: Basilar consolidative changes bilaterally with similar pattern of consolidation though decreased volume loss when compared to the study of September of 2020. Airways are patent. Musculoskeletal: See below for full musculoskeletal detail. CT ABDOMEN PELVIS FINDINGS Hepatobiliary: Liver without focal lesion. Post cholecystectomy. No biliary duct dilation. Pancreas: Mild atrophy of the pancreas without focal lesion, ductal dilation or inflammation. Spleen: Spleen normal in size and contour. Adrenals/Urinary Tract: Adrenal glands are normal. Symmetric renal enhancement. No hydronephrosis. LEFT renal cysts, largest arising from the lower pole measuring 2.8 x 2.9 cm. Urinary bladder under distended limiting assessment. Stomach/Bowel: Gastrointestinal tract with signs of colonic diverticulosis. No acute bowel process. Rectus diastasis. Postoperative changes in the midline of the abdomen related to prior ventral hernia repair. Mild bulging at the site of hernia repair, no herniation beyond the inserted mesh near the umbilicus. Vascular/Lymphatic: Calcified atheromatous plaque in the abdominal aorta. No aneurysmal dilation. There is no gastrohepatic or hepatoduodenal ligament lymphadenopathy. No retroperitoneal or mesenteric lymphadenopathy. No pelvic sidewall lymphadenopathy. Reproductive: No adnexal mass. Reproductive structures are unremarkable.  Other: Post abdominal wall reconstruction with rectus diastasis, no frank hernia. Musculoskeletal: Spinal degenerative changes. Degenerative changes in glenohumeral joints and hips. Ununited fractures along posterior LEFT chest involving ribs 10 through 12. These appear subacute or chronic. These did not appear to be present on previous imaging from September of 2020. IMPRESSION: 1. Supraglottic mass partially imaged on the first image of the data set. No signs of metastatic disease to the chest, abdomen or pelvis. 2. Rounded partially necrotic lymph node in the LEFT neck partially visualized, corresponding to level IIIb lymph node seen on the neck CT. 3. Basilar consolidative changes bilaterally with similar pattern of consolidation though decreased volume loss when compared to the study of September of 2020. Findings may be related to aspiration. No definite lesions seen in the chest though these areas could obscure underlying lesions. 4. Postoperative changes with small amount of pneumomediastinum presumably related to recent tracheostomy tube insertion. 5. Ununited fractures along posterior LEFT chest involving ribs 10 through 12. These appear  subacute or chronic. These did not appear to be present on previous imaging from September of 2020. 6. Post abdominal wall reconstruction with rectus diastasis, no frank hernia. 7. Aortic atherosclerosis. Aortic Atherosclerosis (ICD10-I70.0). Electronically Signed   By: Zetta Bills M.D.   On: 03/15/2020 10:53   DG Chest Port 1 View  Result Date: 03/18/2020 CLINICAL DATA:  Respiratory failure. EXAM: PORTABLE CHEST 1 VIEW COMPARISON:  03/17/2020. FINDINGS: Tracheostomy tube and feeding tube in stable position. Cardiomegaly. Mild pulmonary venous congestion. Mild bilateral interstitial prominence. Mild component of CHF cannot be excluded. Persistent left lower lobe atelectasis/infiltrate. New onset right mid lung prominent atelectatic changes. No pneumothorax.  IMPRESSION: 1. Tracheostomy tube and feeding tube in stable position. 2. Cardiomegaly with mild pulmonary venous congestion and mild bilateral interstitial prominence. Mild component of CHF cannot be excluded. 3. Persistent left lower lobe atelectasis/infiltrate. New onset of right mid lung prominent atelectatic changes. Electronically Signed   By: Marcello Moores  Register   On: 03/18/2020 07:34   DG CHEST PORT 1 VIEW  Result Date: 03/17/2020 CLINICAL DATA:  Respiratory failure EXAM: PORTABLE CHEST 1 VIEW COMPARISON:  March 14, 2020 FINDINGS: Tracheostomy catheter tip is 6.2 cm above the carina. Feeding tube tip is below the diaphragm. No pneumothorax. There is a small left pleural effusion with consolidation in the left lower lobe. There is right base atelectasis with equivocal small right pleural effusion. Heart is mildly enlarged, stable, with pulmonary vascularity normal. No adenopathy. No bone lesions. IMPRESSION: Tube positions as described without pneumothorax. Left pleural effusion with equivocal right pleural effusion. Airspace opacity consistent with combination of atelectasis and pneumonia left lower lobe. Mild atelectasis right base. Stable cardiac prominence. Electronically Signed   By: Lowella Grip III M.D.   On: 03/17/2020 09:02   DG CHEST PORT 1 VIEW  Result Date: 03/14/2020 CLINICAL DATA:  Respiratory failure with hypercapnia EXAM: PORTABLE CHEST 1 VIEW COMPARISON:  March 10, 2020 FINDINGS: Tracheostomy catheter tip is 6.2 cm above the carina. There is ill-defined airspace opacity in the left upper lobe and left base regions with equivocal left pleural effusion. The right lung is clear. Heart is upper normal in size with pulmonary vascularity normal. No adenopathy. No bone lesions. IMPRESSION: Tracheostomy as described without pneumothorax. Patchy airspace opacity consistent with scattered areas of pneumonia in the left upper lobe and left base. Equivocal left pleural effusion. Right  lung clear. Stable cardiac silhouette. Electronically Signed   By: Lowella Grip III M.D.   On: 03/14/2020 09:17   DG Chest Portable 1 View  Result Date: 03/10/2020 CLINICAL DATA:  Shortness of breath and cough EXAM: PORTABLE CHEST 1 VIEW COMPARISON:  January 23, 2020 FINDINGS: There is bibasilar atelectatic change. Elsewhere the interstitium is mildly thickened. No consolidation. Heart size and pulmonary vascularity are normal. No adenopathy. There is degenerative change in each shoulder. IMPRESSION: Bibasilar atelectasis. Interstitial thickening likely represents a degree of underlying chronic bronchitis. No edema or airspace opacity. Heart size within normal limits. Electronically Signed   By: Lowella Grip III M.D.   On: 03/10/2020 12:42   DG Swallowing Func-Speech Pathology  Result Date: 03/11/2020 Objective Swallowing Evaluation: Type of Study: MBS-Modified Barium Swallow Study  Patient Details Name: JAMESE TRAUGER MRN: [id number] Date of Birth: 1960-10-13 Today's Date: 03/11/2020 Time: SLP Start Time (ACUTE ONLY): 9629 -SLP Stop Time (ACUTE ONLY): 1316 SLP Time Calculation (min) (ACUTE ONLY): 21 min Past Medical History: Past Medical History: Diagnosis Date . Bronchitis  . Class 3 obesity 01/23/2020 .  COPD (chronic obstructive pulmonary disease) ([place])  . Degenerative disc disease, lumbar  . Hiatal hernia  . Hypertension  Past Surgical History: Past Surgical History: Procedure Laterality Date . CHOLECYSTECTOMY   . degenerative bone disease   . INCISIONAL HERNIA REPAIR N/A 01/15/2019  Procedure: [name] HERNIORRHAPHY WITH MESH;  Surgeon: [name], MD;  Location: [facility];  Service: General;  Laterality: N/A; . OMENTECTOMY N/A 01/15/2019  Procedure: OMENTECTOMY;  Surgeon: [name], MD;  Location: [facility];  Service: General;  Laterality: N/A; HPI: Analissa Bayless  is a 59 y.o. female, with medical history significant of class III obesity, hiatal hernia, hypertension, bronchitis, COPD,  chronic respiratory failure on home oxygen at 5 LPM, active smoker of 1-1/2 packs of cigarettes per day , patient presents to ED secondary to shortness of breath and altered mental status, patient with receptive worsening dyspnea over the last 4 days, she is not vaccinated against Covid, she is altered at the time of my examination, history was obtained from boyfriend at bedside, and ED staff, patient still smoking, she still using 5 L of oxygen, he is with increased work of breathing, significantly dyspneic, upon presentation to ED she is altered not provide any complaints at this point. BSE requested.  Subjective: "I haven't been able to swallow my pills for a couple weeks." Assessment / Plan / Recommendation CHL IP CLINICAL IMPRESSIONS 03/11/2020 Clinical Impression Pt presents with mild pharyngeal phase dysphagia, however appearance of anatomy appears edematous (epiglottis and aryepiglottic folds). Pt with limited dentition and requires extra time to masticate solids, swallow trigger generally at the level of the valleculae, Pt with blunted appearance of epiglottis with reduced deflection resulting in variable trace, flash penetration of thins during the swallow without aspiration and min vallecular residue with solids and brief stasis of barium tablet in valleculae. Pt with prominent cricopharyngeus. Recommend D3/mech soft and thin liquids, po medications whole in puree and follow with liquid wash or per Pt preference. Also strongly recommend additional imaging (neck CT) and ENT consult pending those results. SLP will follow pending results of imaging. Above to RN and MD.  SLP Visit Diagnosis Dysphagia, oropharyngeal phase (R13.12) Attention and concentration deficit following -- Frontal lobe and executive function deficit following -- Impact on safety and function Mild aspiration risk   CHL IP TREATMENT RECOMMENDATION 03/11/2020 Treatment Recommendations No treatment recommended at this time   Prognosis  03/11/2020 Prognosis for Safe Diet Advancement Fair Barriers to Reach Goals Severity of deficits Barriers/Prognosis Comment -- CHL IP DIET RECOMMENDATION 03/11/2020 SLP Diet Recommendations Dysphagia 3 (Mech soft) solids;Thin liquid Liquid Administration via Cup;Straw Medication Administration Whole meds with puree Compensations Slow rate;Small sips/bites Postural Changes Remain semi-upright after after feeds/meals (Comment);Seated upright at 90 degrees   CHL IP OTHER RECOMMENDATIONS 03/11/2020 Recommended Consults Consider ENT evaluation Oral Care Recommendations Oral care BID Other Recommendations Clarify dietary restrictions   CHL IP FOLLOW UP RECOMMENDATIONS 03/11/2020 Follow up Recommendations None   CHL IP FREQUENCY AND DURATION 03/11/2020 Speech Therapy Frequency (ACUTE ONLY) min 2x/week Treatment Duration 1 week      CHL IP ORAL PHASE 03/11/2020 Oral Phase WFL Oral - Pudding Teaspoon -- Oral - Pudding Cup -- Oral - Honey Teaspoon -- Oral - Honey Cup -- Oral - Nectar Teaspoon -- Oral - Nectar Cup -- Oral - Nectar Straw -- Oral - Thin Teaspoon -- Oral - Thin Cup -- Oral - Thin Straw -- Oral - Puree -- Oral - Mech Soft -- Oral - Regular -- Oral - Multi-Consistency --  Oral - Pill -- Oral Phase - Comment --  CHL IP PHARYNGEAL PHASE 03/11/2020 Pharyngeal Phase Impaired Pharyngeal- Pudding Teaspoon -- Pharyngeal -- Pharyngeal- Pudding Cup -- Pharyngeal -- Pharyngeal- Honey Teaspoon -- Pharyngeal -- Pharyngeal- Honey Cup -- Pharyngeal -- Pharyngeal- Nectar Teaspoon -- Pharyngeal -- Pharyngeal- Nectar Cup -- Pharyngeal -- Pharyngeal- Nectar Straw Pharyngeal residue - valleculae;Reduced epiglottic inversion Pharyngeal -- Pharyngeal- Thin Teaspoon Delayed swallow initiation-vallecula;Reduced epiglottic inversion Pharyngeal -- Pharyngeal- Thin Cup Reduced epiglottic inversion;Penetration/Aspiration during swallow Pharyngeal Material enters airway, remains ABOVE vocal cords then ejected out Pharyngeal- Thin Straw Reduced  epiglottic inversion;Penetration/Aspiration during swallow Pharyngeal Material enters airway, remains ABOVE vocal cords then ejected out Pharyngeal- Puree WFL Pharyngeal -- Pharyngeal- Mechanical Soft -- Pharyngeal -- Pharyngeal- Regular Pharyngeal residue - valleculae;Reduced epiglottic inversion Pharyngeal -- Pharyngeal- Multi-consistency -- Pharyngeal -- Pharyngeal- Pill Pharyngeal residue - valleculae;Reduced epiglottic inversion Pharyngeal -- Pharyngeal Comment edematous pharynx  CHL IP CERVICAL ESOPHAGEAL PHASE 03/11/2020 Cervical Esophageal Phase Impaired Pudding Teaspoon -- Pudding Cup -- Honey Teaspoon -- Honey Cup -- Nectar Teaspoon -- Nectar Cup -- Nectar Straw -- Thin Teaspoon -- Thin Cup Prominent cricopharyngeal segment Thin Straw -- Puree -- Mechanical Soft -- Regular -- Multi-consistency -- Pill -- Cervical Esophageal Comment -- Thank you, [name], Drummond Hasty 03/11/2020, 2:12 PM              ECHOCARDIOGRAM COMPLETE  Result Date: 03/11/2020    ECHOCARDIOGRAM REPORT   Patient Name:   CHYANN AMBROCIO Date of Exam: 03/11/2020 Medical Rec #:  [id number]       Height:       65.0 in Accession #:    [id number]      Weight:       244.0 lb Date of Birth:  06-29-1960       BSA:          2.153 m Patient Age:    64 years        BP:           115/35 mmHg Patient Gender: F               HR:           57 bpm. Exam Location:  [facility] Procedure: 2D Echo Indications:    Dyspnea 786.09 / R06.00  History:        Patient has prior history of Echocardiogram examinations, most                 recent 01/16/2019. COPD; Risk Factors:Hypertension and Current                 Smoker. Acute respiratory failure with hypoxia.  Sonographer:    Leavy Cella RDCS (AE) Referring Phys: [id number] [address] S [place] IMPRESSIONS  1. Left ventricular ejection fraction, by estimation, is 70 to 75%. The left ventricle has hyperdynamic function. The left ventricle has no regional wall motion abnormalities.  There is mild left ventricular hypertrophy. Left ventricular diastolic parameters were normal.  2. Right ventricular systolic function is normal. The right ventricular size is normal. Tricuspid regurgitation signal is inadequate for assessing PA pressure.  3. The mitral valve is grossly normal. Trivial mitral valve regurgitation.  4. The aortic valve is tricuspid. Aortic valve regurgitation is not visualized.  5. The inferior vena cava is normal in size with greater than 50% respiratory variability, suggesting right atrial pressure of 3 mmHg. FINDINGS  Left Ventricle: Left ventricular ejection fraction, by estimation, is 70 to 75%. The left  ventricle has hyperdynamic function. The left ventricle has no regional wall motion abnormalities. The left ventricular internal cavity size was normal in size. There is mild left ventricular hypertrophy. Left ventricular diastolic parameters were normal. Right Ventricle: The right ventricular size is normal. No increase in right ventricular wall thickness. Right ventricular systolic function is normal. Tricuspid regurgitation signal is inadequate for assessing PA pressure. Left Atrium: Left atrial size was normal in size. Right Atrium: Right atrial size was normal in size. Pericardium: There is no evidence of pericardial effusion. Mitral Valve: The mitral valve is grossly normal. Trivial mitral valve regurgitation. Tricuspid Valve: The tricuspid valve is grossly normal. Tricuspid valve regurgitation is trivial. Aortic Valve: The aortic valve is tricuspid. Aortic valve regurgitation is not visualized. Pulmonic Valve: The pulmonic valve was not well visualized. Pulmonic valve regurgitation is not visualized. Aorta: The aortic root is normal in size and structure. Venous: The inferior vena cava is normal in size with greater than 50% respiratory variability, suggesting right atrial pressure of 3 mmHg. IAS/Shunts: No atrial level shunt detected by color flow Doppler.  LEFT VENTRICLE  PLAX 2D LVIDd:         4.05 cm  Diastology LVIDs:         2.30 cm  LV e' medial:    8.59 cm/s LV PW:         1.35 cm  LV E/e' medial:  12.2 LV IVS:        1.34 cm  LV e' lateral:   11.00 cm/s LVOT diam:     2.00 cm  LV E/e' lateral: 9.5 LVOT Area:     3.14 cm  RIGHT VENTRICLE RV S prime:     15.40 cm/s TAPSE (M-mode): 2.9 cm LEFT ATRIUM             Index       RIGHT ATRIUM           Index LA diam:        4.70 cm 2.18 cm/m  RA Area:     13.50 cm LA Vol (A2C):   73.3 ml 34.05 ml/m RA Volume:   35.40 ml  16.44 ml/m LA Vol (A4C):   39.3 ml 18.26 ml/m LA Biplane Vol: 55.6 ml 25.83 ml/m   AORTA Ao Root diam: 2.70 cm MITRAL VALVE MV Area (PHT): 2.69 cm     SHUNTS MV Decel Time: 282 msec     Systemic Diam: 2.00 cm MV E velocity: 105.00 cm/s MV A velocity: 57.80 cm/s MV E/A ratio:  1.82 Rozann Lesches MD Electronically signed by Rozann Lesches MD Signature Date/Time: 03/11/2020/4:59:31 PM    Final     Labs:  CBC: Recent Labs    03/15/20 0105 03/16/20 1601 03/17/20 0245 03/18/20 0909  WBC 8.2 6.9 9.5 11.4*  HGB 14.6 14.1 14.5 14.2  HCT 45.4 44.8 46.2* 45.9  PLT 146* 121* 112* 103*    COAGS: No results for input(s): INR, APTT in the last 8760 hours.  BMP: Recent Labs    01/23/20 0234 01/23/20 0234 01/24/20 0425 03/10/20 1226 03/15/20 0105 03/16/20 0613 03/17/20 0245 03/18/20 0909  NA 138   < > 137   < > 141 144 145 142  K 4.0   < > 4.0   < > 4.3 3.9 4.3 4.5  CL 105   < > 92*   < > 104 105 104 105  CO2 24   < > 33*   < > 30 30 32 32  GLUCOSE 86   < > 135*   < > 108* 126* 116* 123*  BUN 13   < > 23*   < > 37* 35* 31* 28*  CALCIUM 9.0   < > 9.3   < > 8.6* 8.8* 8.6* 8.5*  CREATININE 0.92   < > 0.94   < > 1.20* 1.13* 1.10* 0.91  GFRNONAA >60   < > >60   < > 52* 56* 58* >60  GFRAA >60  --  >60  --   --   --   --   --    < > = values in this interval not displayed.    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 Plan for image-guided percutaneous gastrostomy tube placement in IR tentatively for Monday 03/22/2020 pending IR scheduling. Patient will be NPO at midnight prior to procedure including tube feeds. Afebrile. Will hold Lovenox per IR protocol. INR pending.  Called patient's sister, [name], at 1440 per patient's request to update on upcoming procedure. All questions answered and concerns addressed.  Risks and benefits discussed with the patient including, but not limited to the need for a barium enema during the procedure, bleeding, infection, peritonitis, or damage to adjacent structures. All of the patient's questions were answered, patient is agreeable to proceed. Consent signed and in chart.   Thank you for this interesting consult.  I greatly enjoyed [place] and look forward to participating in their care.  A copy of this report was sent to the requesting provider on this date.  Electronically Signed: Earley Abide, PA-C 03/18/2020, 2:08 PM   I spent a total of 40 Minutes in face to face in clinical consultation, greater than 50% of which was counseling/coordinating care for dysphagia/gastrostomy tube placement.

## 2020-03-18 NOTE — Progress Notes (Signed)
HEMATOLOGY-ONCOLOGY PROGRESS NOTE  SUBJECTIVE: Overall reports that she is feeling well today.  No pain to her trach site. She ha been needing vent support at night. Feeding through NG tube. No fevers.  REVIEW OF SYSTEMS:   Constitutional: Denies fevers, chills Eyes: Denies blurriness of vision Ears, nose, mouth, throat, and face: Denies mucositis or sore throat Respiratory: No shortness of breath reported today. Needing vent support at night. Cardiovascular: Denies palpitation, chest discomfort Gastrointestinal:  Denies nausea, heartburn or change in bowel habits Behavioral/Psych: Mood is stable, no new changes  Extremities: No lower extremity edema All other systems were reviewed with the patient and are negative.  I have reviewed the past medical history, past surgical history, social history and family history with the patient and they are unchanged from previous note.   PHYSICAL EXAMINATION: ECOG PERFORMANCE STATUS: 3 - Symptomatic, >50% confined to bed  Vitals:   03/18/20 1100 03/18/20 1200  BP: (!) 135/58 (!) 125/52  Pulse: 67 70  Resp: 15 16  Temp:  98.3 F (36.8 C)  SpO2: 91% 92%   Filed Weights   03/16/20 0500 03/17/20 0500 03/18/20 0500  Weight: 247 lb 12.8 oz (112.4 kg) 248 lb 0.3 oz (112.5 kg) 248 lb 0.3 oz (112.5 kg)    Intake/Output from previous day: 11/17 0701 - 11/18 0700 In: 2024 [NG/GT:1640; IV Piggyback:384] Out: 775 [Urine:775]  GENERAL:alert, no distress and comfortable, on trach collar SKIN: skin color, texture, turgor are normal, no rashes or significant lesions EYES: normal, Conjunctiva are pink and non-injected, sclera clear NECK: Tracheostomy midline without erythema or drainage LYMPH: Palpable cervical lymph nodes on the left LUNGS: clear to auscultation and percussion with normal breathing effort HEART: regular rate & rhythm and no murmurs and no lower extremity edema ABDOMEN:abdomen soft, non-tender and normal bowel sounds NEURO: alert &  oriented  LABORATORY DATA:  I have reviewed the data as listed CMP Latest Ref Rng & Units 03/18/2020 03/17/2020 03/16/2020  Glucose 70 - 99 mg/dL 123(H) 116(H) 126(H)  BUN 6 - 20 mg/dL 28(H) 31(H) 35(H)  Creatinine 0.44 - 1.00 mg/dL 0.91 1.10(H) 1.13(H)  Sodium 135 - 145 mmol/L 142 145 144  Potassium 3.5 - 5.1 mmol/L 4.5 4.3 3.9  Chloride 98 - 111 mmol/L 105 104 105  CO2 22 - 32 mmol/L 32 32 30  Calcium 8.9 - 10.3 mg/dL 8.5(L) 8.6(L) 8.8(L)  Total Protein 6.5 - 8.1 g/dL - - -  Total Bilirubin 0.3 - 1.2 mg/dL - - -  Alkaline Phos 38 - 126 U/L - - -  AST 15 - 41 U/L - - -  ALT 0 - 44 U/L - - -    Lab Results  Component Value Date   WBC 11.4 (H) 03/18/2020   HGB 14.2 03/18/2020   HCT 45.9 03/18/2020   MCV 102.7 (H) 03/18/2020   PLT 103 (L) 03/18/2020   NEUTROABS 7.4 03/17/2020    CT SOFT TISSUE NECK W CONTRAST  Addendum Date: 03/11/2020   ADDENDUM REPORT: 03/11/2020 16:36 ADDENDUM: Study discussed by telephone with Dr. Jolaine Artist MEMON on 03/11/2020 at 1631 hours. Electronically Signed   By: Genevie Ann M.D.   On: 03/11/2020 16:36   Result Date: 03/11/2020 CLINICAL DATA:  59 year old female with dysphagia. Tonsillitis suspected. EXAM: CT NECK WITH CONTRAST TECHNIQUE: Multidetector CT imaging of the neck was performed using the standard protocol following the bolus administration of intravenous contrast. CONTRAST:  68mL OMNIPAQUE IOHEXOL 300 MG/ML  SOLN COMPARISON:  Chest CT 01/28/2019. FINDINGS: Pharynx  and larynx: Bulky soft tissue mass throughout the bilateral supraglottic larynx and affecting the hypopharynx. Lobulated diffuse thickening of the epiglottis (series 2, image 53). Diffuse false cord and anterior commissure involvement with evidence of extension into the right paraglottic space on series 2 image 61 - where nodular direct extension of tumor and/or inseparable abnormal right level 3 lymph node protrudes on coronal image 58. Asymmetric erosion of the undersurface of the right  thyroid cartilage on image 65. Asymmetric sclerosis of the right arytenoid on image 67., and midline extension of tumor is suspected through the anterior commissure a distance of about 11 mm as seen on series 2, image 67 and sagittal image 48. All told, tumor size is estimated at 37 x 52 by 45 mm (AP by transverse by CC). There is evidence of airway compromise. The true cords may be spared as seen on series 2, image 71. Subglottic larynx is within normal limits. Above the vallecula pharyngeal contours appear normal. Normal superior parapharyngeal and retropharyngeal spaces. Salivary glands: Negative sublingual space. Submandibular glands and parotid glands remain within normal limits. Thyroid: Negative. Lymph nodes: Malignant left level IIIb lymph node measures 17 mm short axis and 29 mm long axis (series 2, image 55 and coronal image 73). As stated above it is possible of malignant right level IIIa lymph node is inseparable from the parent tumor along the right paraglottic space (coronal image 58). Smaller but asymmetrically enlarged left level 4 nodes measure up to 9 mm short axis (coronal image 72). No other abnormal or suspicious lymph nodes identified. Vascular: Major vascular structures in the neck and at the skull base remain patent including both internal jugular veins. The left vertebral artery appears dominant. Calcified atherosclerosis at the skull base. Limited intracranial: Negative. Visualized orbits: Negative. Mastoids and visualized paranasal sinuses: Clear. Skeleton: Absent and carious dentition. Cervical spine degeneration. No suspicious osseous lesion identified. 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 No superior mediastinal lymphadenopathy. Visible axillary lymph nodes are normal. Mild dependent atelectasis.  No upper lung nodule identified. IMPRESSION: 1. 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 Recommend ENT consultation. Involvement of  right laryngeal cartilages, with extension in the midline anterior to the strap muscles, and also extension through the right paraglottic space and/or inseparable malignant right level IIIa lymph node. Estimated tumor long axis 5.2 cm. 2. Malignant contralateral left level 3b lymph node is 2.9 cm long axis. Smaller indeterminate left level 4 lymph nodes. 3. No distant metastatic disease identified in the neck or upper chest. Electronically Signed: By: Genevie Ann M.D. On: 03/11/2020 16:23   CT CHEST ABDOMEN PELVIS W CONTRAST  Result Date: 03/15/2020 CLINICAL DATA:  New diagnosis of head neck mass EXAM: CT CHEST, ABDOMEN, AND PELVIS WITH CONTRAST TECHNIQUE: Multidetector CT imaging of the chest, abdomen and pelvis was performed following the standard protocol during bolus administration of intravenous contrast. CONTRAST:  153mL OMNIPAQUE IOHEXOL 300 MG/ML  SOLN COMPARISON:  Prior swallowing evaluation and CT of the neck. FINDINGS: CT CHEST FINDINGS Cardiovascular: Calcified coronary artery disease. Aorta is normal caliber. 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 Central pulmonary vasculature is mildly engorged. Approximately 3 cm greatest caliber unchanged from previous exam. Unremarkable on limited venous phase assessment. Mediastinum/Nodes: Supraglottic mass partially imaged on the first image of the data set. Interval placement of tracheostomy tube with resultant pneumomediastinum in the anterior mediastinum in upper mediastinum. No adenopathy within the mediastinum or axilla. No hilar adenopathy. Rounded partial  necrotic lymph node in the LEFT neck partially visualized, corresponding to level IIIb lymph node seen on the neck CT. Lungs/Pleura: Basilar consolidative changes bilaterally with similar pattern of consolidation though decreased volume loss when compared to the study of September of 2020. Airways are patent. Musculoskeletal: See below for full musculoskeletal  detail. CT ABDOMEN PELVIS FINDINGS Hepatobiliary: Liver without focal lesion. Post cholecystectomy. No biliary duct dilation. Pancreas: Mild atrophy of the pancreas without focal lesion, ductal dilation or inflammation. Spleen: Spleen normal in size and contour. Adrenals/Urinary Tract: Adrenal glands are normal. Symmetric renal enhancement. No hydronephrosis. LEFT renal cysts, largest arising from the lower pole measuring 2.8 x 2.9 cm. Urinary bladder under distended limiting assessment. Stomach/Bowel: Gastrointestinal tract with signs of colonic diverticulosis. No acute bowel process. Rectus diastasis. Postoperative changes in the midline of the abdomen related to prior ventral hernia repair. Mild bulging at the site of hernia repair, no herniation beyond the inserted mesh near the umbilicus. Vascular/Lymphatic: Calcified atheromatous plaque in the abdominal aorta. No aneurysmal dilation. There is no gastrohepatic or hepatoduodenal ligament lymphadenopathy. No retroperitoneal or mesenteric lymphadenopathy. No pelvic sidewall lymphadenopathy. Reproductive: No adnexal mass. Reproductive structures are unremarkable. Other: Post abdominal wall reconstruction with rectus diastasis, no frank hernia. Musculoskeletal: Spinal degenerative changes. Degenerative changes in glenohumeral joints and hips. Ununited fractures along posterior LEFT chest involving ribs 10 through 12. These appear subacute or chronic. These did not appear to be present on previous imaging from September of 2020. IMPRESSION: 1. Supraglottic mass partially imaged on the first image of the data set. No signs of metastatic disease to the chest, abdomen or pelvis. 2. Rounded partially necrotic lymph node in the LEFT neck partially visualized, corresponding to level IIIb lymph node seen on the neck CT. 3. Basilar consolidative changes bilaterally with similar pattern of consolidation though decreased volume loss when compared to the study of September of  2020. Findings may be related to aspiration. No definite lesions seen in the chest though these areas could obscure underlying lesions. 4. Postoperative changes with small amount of pneumomediastinum presumably related to recent tracheostomy tube insertion. 5. Ununited fractures along posterior LEFT chest involving ribs 10 through 12. These appear subacute or chronic. These did not appear to be present on previous imaging from September of 2020. 6. Post abdominal wall reconstruction with rectus diastasis, no frank hernia. 7. Aortic atherosclerosis. Aortic Atherosclerosis (ICD10-I70.0). Electronically Signed   By: Zetta Bills M.D.   On: 03/15/2020 10:53   DG Chest Port 1 View  Result Date: 03/18/2020 CLINICAL DATA:  Respiratory failure. EXAM: PORTABLE CHEST 1 VIEW COMPARISON:  03/17/2020. FINDINGS: Tracheostomy tube and feeding tube in stable position. Cardiomegaly. Mild pulmonary venous congestion. Mild bilateral interstitial prominence. Mild component of CHF cannot be excluded. Persistent left lower lobe atelectasis/infiltrate. New onset right mid lung prominent atelectatic changes. No pneumothorax. IMPRESSION: 1. Tracheostomy tube and feeding tube in stable position. 2. Cardiomegaly with mild pulmonary venous congestion and mild bilateral interstitial prominence. Mild component of CHF cannot be excluded. 3. Persistent left lower lobe atelectasis/infiltrate. New onset of right mid lung prominent atelectatic changes. Electronically Signed   By: Marcello Moores  Register   On: 03/18/2020 07:34   DG CHEST PORT 1 VIEW  Result Date: 03/17/2020 CLINICAL DATA:  Respiratory failure EXAM: PORTABLE CHEST 1 VIEW COMPARISON:  March 14, 2020 FINDINGS: Tracheostomy catheter tip is 6.2 cm above the carina. Feeding tube tip is below the diaphragm. No pneumothorax. There is a small left pleural effusion with consolidation in the left  lower lobe. There is right base atelectasis with equivocal small right pleural effusion.  Heart is mildly enlarged, stable, with pulmonary vascularity normal. No adenopathy. No bone lesions. IMPRESSION: Tube positions as described without pneumothorax. Left pleural effusion with equivocal right pleural effusion. Airspace opacity consistent with combination of atelectasis and pneumonia left lower lobe. Mild atelectasis right base. Stable cardiac prominence. Electronically Signed   By: Lowella Grip III M.D.   On: 03/17/2020 09:02   DG CHEST PORT 1 VIEW  Result Date: 03/14/2020 CLINICAL DATA:  Respiratory failure with hypercapnia EXAM: PORTABLE CHEST 1 VIEW COMPARISON:  March 10, 2020 FINDINGS: Tracheostomy catheter tip is 6.2 cm above the carina. There is ill-defined airspace opacity in the left upper lobe and left base regions with equivocal left pleural effusion. The right lung is clear. Heart is upper normal in size with pulmonary vascularity normal. No adenopathy. No bone lesions. IMPRESSION: Tracheostomy as described without pneumothorax. Patchy airspace opacity consistent with scattered areas of pneumonia in the left upper lobe and left base. Equivocal left pleural effusion. Right lung clear. Stable cardiac silhouette. Electronically Signed   By: Lowella Grip III M.D.   On: 03/14/2020 09:17   DG Chest Portable 1 View  Result Date: 03/10/2020 CLINICAL DATA:  Shortness of breath and cough EXAM: PORTABLE CHEST 1 VIEW COMPARISON:  January 23, 2020 FINDINGS: There is bibasilar atelectatic change. Elsewhere the interstitium is mildly thickened. No consolidation. Heart size and pulmonary vascularity are normal. No adenopathy. There is degenerative change in each shoulder. IMPRESSION: Bibasilar atelectasis. Interstitial thickening likely represents a degree of underlying chronic bronchitis. No edema or airspace opacity. Heart size within normal limits. Electronically Signed   By: Lowella Grip III M.D.   On: 03/10/2020 12:42   DG Swallowing Func-Speech Pathology  Result Date:  03/11/2020 Objective Swallowing Evaluation: Type of Study: MBS-Modified Barium Swallow Study  Patient Details Name: JAMEEKA MARCY MRN: [id number] Date of Birth: [birth year] Today's Date: 03/11/2020 Time: SLP Start Time (ACUTE ONLY): 7628 -SLP Stop Time (ACUTE ONLY): 1316 SLP Time Calculation (min) (ACUTE ONLY): 21 min Past Medical History: Past Medical History: Diagnosis Date . Bronchitis  . Class 3 obesity 01/23/2020 . COPD (chronic obstructive pulmonary disease) ([place])  . Degenerative disc disease, lumbar  . Hiatal hernia  . Hypertension  Past Surgical History: Past Surgical History: Procedure Laterality Date . CHOLECYSTECTOMY   . degenerative bone disease   . INCISIONAL HERNIA REPAIR N/A 01/15/2019  Procedure: [name] HERNIORRHAPHY WITH MESH;  Surgeon: [name], MD;  Location: [facility];  Service: General;  Laterality: N/A; . OMENTECTOMY N/A 01/15/2019  Procedure: OMENTECTOMY;  Surgeon: [name], MD;  Location: [facility];  Service: General;  Laterality: N/A; HPI: Abigayl Hor  is a 59 y.o. female, with medical history significant of class III obesity, hiatal hernia, hypertension, bronchitis, COPD, chronic respiratory failure on home oxygen at 5 LPM, active smoker of 1-1/2 packs of cigarettes per day , patient presents to ED secondary to shortness of breath and altered mental status, patient with receptive worsening dyspnea over the last 4 days, she is not vaccinated against Covid, she is altered at the time of my examination, history was obtained from boyfriend at bedside, and ED staff, patient still smoking, she still using 5 L of oxygen, he is with increased work of breathing, significantly dyspneic, upon presentation to ED she is altered not provide any complaints at this point. BSE requested.  Subjective: "I haven't been able to swallow my pills for a couple weeks."  Assessment / Plan / Recommendation CHL IP CLINICAL IMPRESSIONS 03/11/2020 Clinical Impression Pt presents with mild pharyngeal phase  dysphagia, however appearance of anatomy appears edematous (epiglottis and aryepiglottic folds). Pt with limited dentition and requires extra time to masticate solids, swallow trigger generally at the level of the valleculae, Pt with blunted appearance of epiglottis with reduced deflection resulting in variable trace, flash penetration of thins during the swallow without aspiration and min vallecular residue with solids and brief stasis of barium tablet in valleculae. Pt with prominent cricopharyngeus. Recommend D3/mech soft and thin liquids, po medications whole in puree and follow with liquid wash or per Pt preference. Also strongly recommend additional imaging (neck CT) and ENT consult pending those results. SLP will follow pending results of imaging. Above to RN and MD.  SLP Visit Diagnosis Dysphagia, oropharyngeal phase (R13.12) Attention and concentration deficit following -- Frontal lobe and executive function deficit following -- Impact on safety and function Mild aspiration risk   CHL IP TREATMENT RECOMMENDATION 03/11/2020 Treatment Recommendations No treatment recommended at this time   Prognosis 03/11/2020 Prognosis for Safe Diet Advancement Fair Barriers to Reach Goals Severity of deficits Barriers/Prognosis Comment -- CHL IP DIET RECOMMENDATION 03/11/2020 SLP Diet Recommendations Dysphagia 3 (Mech soft) solids;Thin liquid Liquid Administration via Cup;Straw Medication Administration Whole meds with puree Compensations Slow rate;Small sips/bites Postural Changes Remain semi-upright after after feeds/meals (Comment);Seated upright at 90 degrees   CHL IP OTHER RECOMMENDATIONS 03/11/2020 Recommended Consults Consider ENT evaluation Oral Care Recommendations Oral care BID Other Recommendations Clarify dietary restrictions   CHL IP FOLLOW UP RECOMMENDATIONS 03/11/2020 Follow up Recommendations None   CHL IP FREQUENCY AND DURATION 03/11/2020 Speech Therapy Frequency (ACUTE ONLY) min 2x/week Treatment Duration  1 week      CHL IP ORAL PHASE 03/11/2020 Oral Phase WFL Oral - Pudding Teaspoon -- Oral - Pudding Cup -- Oral - Honey Teaspoon -- Oral - Honey Cup -- Oral - Nectar Teaspoon -- Oral - Nectar Cup -- Oral - Nectar Straw -- Oral - Thin Teaspoon -- Oral - Thin Cup -- Oral - Thin Straw -- Oral - Puree -- Oral - Mech Soft -- Oral - Regular -- Oral - Multi-Consistency -- Oral - Pill -- Oral Phase - Comment --  CHL IP PHARYNGEAL PHASE 03/11/2020 Pharyngeal Phase Impaired Pharyngeal- Pudding Teaspoon -- Pharyngeal -- Pharyngeal- Pudding Cup -- Pharyngeal -- Pharyngeal- Honey Teaspoon -- Pharyngeal -- Pharyngeal- Honey Cup -- Pharyngeal -- Pharyngeal- Nectar Teaspoon -- Pharyngeal -- Pharyngeal- Nectar Cup -- Pharyngeal -- Pharyngeal- Nectar Straw Pharyngeal residue - valleculae;Reduced epiglottic inversion Pharyngeal -- Pharyngeal- Thin Teaspoon Delayed swallow initiation-vallecula;Reduced epiglottic inversion Pharyngeal -- Pharyngeal- Thin Cup Reduced epiglottic inversion;Penetration/Aspiration during swallow Pharyngeal Material enters airway, remains ABOVE vocal cords then ejected out Pharyngeal- Thin Straw Reduced epiglottic inversion;Penetration/Aspiration during swallow Pharyngeal Material enters airway, remains ABOVE vocal cords then ejected out Pharyngeal- Puree WFL Pharyngeal -- Pharyngeal- Mechanical Soft -- Pharyngeal -- Pharyngeal- Regular Pharyngeal residue - valleculae;Reduced epiglottic inversion Pharyngeal -- Pharyngeal- Multi-consistency -- Pharyngeal -- Pharyngeal- Pill Pharyngeal residue - valleculae;Reduced epiglottic inversion Pharyngeal -- Pharyngeal Comment edematous pharynx  CHL IP CERVICAL ESOPHAGEAL PHASE 03/11/2020 Cervical Esophageal Phase Impaired Pudding Teaspoon -- Pudding Cup -- Honey Teaspoon -- Honey Cup -- Nectar Teaspoon -- Nectar Cup -- Nectar Straw -- Thin Teaspoon -- Thin Cup Prominent cricopharyngeal segment Thin Straw -- Puree -- Mechanical Soft -- Regular -- Multi-consistency -- Pill  -- Cervical Esophageal Comment -- Thank you, [name], [place] PORTER,DABNEY 03/11/2020, 2:12 PM  ECHOCARDIOGRAM COMPLETE  Result Date: 03/11/2020    ECHOCARDIOGRAM REPORT   Patient Name:   LEN KLUVER Date of Exam: 03/11/2020 Medical Rec #:  [id number]       Height:       65.0 in Accession #:    [id number]      Weight:       244.0 lb Date of Birth:  Oct 19, 1960       BSA:          2.153 m Patient Age:    59 years        BP:           115/35 mmHg Patient Gender: F               HR:           57 bpm. Exam Location:  [facility] Procedure: 2D Echo Indications:    Dyspnea 786.09 / R06.00  History:        Patient has prior history of Echocardiogram examinations, most                 recent 01/16/2019. COPD; Risk Factors:Hypertension and Current                 Smoker. Acute respiratory failure with hypoxia.  Sonographer:    Leavy Cella RDCS (AE) Referring Phys: [id number] [place] [place] IMPRESSIONS  1. Left ventricular ejection fraction, by estimation, is 70 to 75%. The left ventricle has hyperdynamic function. The left ventricle has no regional wall motion abnormalities. There is mild left ventricular hypertrophy. Left ventricular diastolic parameters were normal.  2. Right ventricular systolic function is normal. The right ventricular size is normal. Tricuspid regurgitation signal is inadequate for assessing PA pressure.  3. The mitral valve is grossly normal. Trivial mitral valve regurgitation.  4. The aortic valve is tricuspid. Aortic valve regurgitation is not visualized.  5. The inferior vena cava is normal in size with greater than 50% respiratory variability, suggesting right atrial pressure of 3 mmHg. FINDINGS  Left Ventricle: Left ventricular ejection fraction, by estimation, is 70 to 75%. The left ventricle has hyperdynamic function. The left ventricle has no regional wall motion abnormalities. The left ventricular internal cavity size was normal in size. There is mild  left ventricular hypertrophy. Left ventricular diastolic parameters were normal. Right Ventricle: The right ventricular size is normal. No increase in right ventricular wall thickness. Right ventricular systolic function is normal. Tricuspid regurgitation signal is inadequate for assessing PA pressure. Left Atrium: Left atrial size was normal in size. Right Atrium: Right atrial size was normal in size. Pericardium: There is no evidence of pericardial effusion. Mitral Valve: The mitral valve is grossly normal. Trivial mitral valve regurgitation. Tricuspid Valve: The tricuspid valve is grossly normal. Tricuspid valve regurgitation is trivial. Aortic Valve: The aortic valve is tricuspid. Aortic valve regurgitation is not visualized. Pulmonic Valve: The pulmonic valve was not well visualized. Pulmonic valve regurgitation is not visualized. Aorta: The aortic root is normal in size and structure. Venous: The inferior vena cava is normal in size with greater than 50% respiratory variability, suggesting right atrial pressure of 3 mmHg. IAS/Shunts: No atrial level shunt detected by color flow Doppler.  LEFT VENTRICLE PLAX 2D LVIDd:         4.05 cm  Diastology LVIDs:         2.30 cm  LV e' medial:    8.59 cm/s LV PW:         1.35  cm  LV E/e' medial:  12.2 LV IVS:        1.34 cm  LV e' lateral:   11.00 cm/s LVOT diam:     2.00 cm  LV E/e' lateral: 9.5 LVOT Area:     3.14 cm  RIGHT VENTRICLE RV S prime:     15.40 cm/s TAPSE (M-mode): 2.9 cm LEFT ATRIUM             Index       RIGHT ATRIUM           Index LA diam:        4.70 cm 2.18 cm/m  RA Area:     13.50 cm LA Vol (A2C):   73.3 ml 34.05 ml/m RA Volume:   35.40 ml  16.44 ml/m LA Vol (A4C):   39.3 ml 18.26 ml/m LA Biplane Vol: 55.6 ml 25.83 ml/m   AORTA Ao Root diam: 2.70 cm MITRAL VALVE MV Area (PHT): 2.69 cm     SHUNTS MV Decel Time: 282 msec     Systemic Diam: 2.00 cm MV E velocity: 105.00 cm/s MV A velocity: 57.80 cm/s MV E/A ratio:  1.82 Rozann Lesches MD  Electronically signed by Rozann Lesches MD Signature Date/Time: 03/11/2020/4:59:31 PM    Final     ASSESSMENT AND PLAN: 58 year old Caucasian female, with medical history of heavy smoking, COPD on home oxygen 5 L/min, hypertension, presents with worsening dyspnea and altered mental status.  Work-up showed a large laryngeal mass.  Patient required urgent tracheostomy.  1.  Supraglottic laryngeal cancer, [id number] 2. COPD with acute exacerbation, on chronic home oxygen 5 L/min 3. HTN  4.  Heavy smoking history  Recommendations:  -I have reviewed her initial CT scan and her records.  She has T4aN2c supraglottic laryngeal cancer, with compromised airway, required urgent tracheostomy.  -For staging scan, we ideally would like to get a PET scan.  However this is not feasible while she is in hospital, and there is quite bit social difficult to get her treated as outpt (she lives an hour away and has no transportation). CT C/A/P performed did not show evidence of distant mets.  - Ideally for T4a tumors, total laryngectomy with bilateral neck dissection is the way to go but patient refused surgery. I did explain to her if patient declines surgery, we can proceed with concurrent chemo radiation although it is likely that she may need salvage surgery if she has incomplete response. Given baseline PS, she is not an ideal candidate for every 21 day cisplatin, hence we can proceed with weekly cisplatin. We will coordinate with radiation oncology for chemo start. Informed consent obtained today. I have discussed adverse effects from cisplatin including but not limited to fatigue, nausea, vomiting, increased risk of infections which can be fatal occasionally, ototoxicity resulting in permanent hearing loss at times, nephrotoxicity which can occasionally be permanent. She understands that the treatment can be tough and life threatening rarely but she agrees to proceed We will need a PEG tube for feeding,  discussed with ICU team [facility] NP. Will e-mail team about treatment planning.   LOS: 8 days   Benay Pike, MD

## 2020-03-18 NOTE — Progress Notes (Addendum)
NAME:  Veronica Horton, MRN:  [id number], DOB:  03-26-1961, LOS: 8 ADMISSION DATE:  03/10/2020, CONSULTATION DATE:  03/18/20 REFERRING MD:  Hospitalist, CHIEF COMPLAINT:  Respiratory failure   Brief History   59 y.o. F with PMH of COPD 11/10 with shortness of breath found to have laryngeal mass concerning for malignancy s/p biopsy and tracheostomy.  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  She was transferred to the intensive care unit post biopsy as she required ventilator support.  Tx to [facility] on 11/17 for ONC therapy.   Past Medical History  COPD Bronchitis  Class III Obesity  DJD  Hiatal Hernia  HTN  Significant Hospital Events   11/10 admit to hospitalists with SOB 11/13 laryngoscopy and biopsy with tracheostomy 11/15 transferred to intensive care unit 11/15 transition to trach collar 11/17 Tx to [facility] for ONC therapy. 24 hours on ATC.   Consults:  ENT Oncology PCCM  Procedures:  ENT Trach 11/13 >>  Significant Diagnostic Tests:  11/11 CT soft tissue neck with contrast >> Bulky bilateral supraglottic tumor with possible airway Compromise, Involvement of right laryngeal cartilages, with extension in the midline anterior to the strap muscles, and also extension through the right paraglottic space and/or inseparable malignant right level IIIa lymph node. 11/15 CT chest/abdomen/pelvis >> no signs of metastatic disease, rounded partially necrotic lymph node to the left neck, basilar consolidative changes similar to prior, small pneumomediastinum  Micro Data:  COVID 11/10 >> negative Influenza 11/10 >> negative  MRSA PCR 11/10 >>  Sputum 11/14 >> serratia >> R-cefazolin, S-ceftriaxone  Antimicrobials:  Azithro 11/10 x1  Clinda 11/10 x1  Doxycycline 11/10 >> 11/14  Levofloxacin 11/14 >>   Interim history/subjective:  Afebrile / WBC 9.5  On Vent - PSV 8/8 with 40% fiO2  Glucose range 99-165 I/O 775 ml UOP, +1.2L in last 24  hours Pt denies complaints  Objective   Blood pressure (!) 116/46, pulse 75, temperature 97.9 F (36.6 C), temperature source Oral, resp. rate 17, height 5\' 5"  (1.651 m), weight 112.5 kg, SpO2 96 %.    Vent Mode: PSV;CPAP FiO2 (%):  [40 %-100 %] 40 % Set Rate:  [14 bmp] 14 bmp Vt Set:  [450 mL] 450 mL PEEP:  [8 cmH20] 8 cmH20 Pressure Support:  [8 cmH20] 8 cmH20 Plateau Pressure:  [10 cmH20-16 cmH20] 15 cmH20   Intake/Output Summary (Last 24 hours) at 03/18/2020 0818 Last data filed at 03/18/2020 0600 Gross per 24 hour  Intake 1904.01 ml  Output 775 ml  Net 1129.01 ml   Filed Weights   03/16/20 0500 03/17/20 0500 03/18/20 0500  Weight: 112.4 kg 112.5 kg 112.5 kg    Exam: General: adult female lying in bed watching [employer] movies  HEENT: MM pink/moist, trach midline c/d/i  Neuro: AAOx4, communicates approproately  CV: s1s2 RRR, no m/r/g PULM: non-labored on PSV, lungs bilaterally with rhonchi GI: soft, bsx4 active  Extremities: warm/dry, trace pedal edema  Skin: no rashes or lesions  PCXR 11/18 >> images personally reviewed, persistent LLL opacity, new RML linear atelectasis  Resolved Hospital Problem list     Assessment & Plan:    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 Chryl Heck, will plan for PEG   Acute on Chronic Hypoxic Respiratory Failure   Tracheostomy Status  COPD on 5L home O2 Serratia PNA, Suspected Aspiration  Baseline 5L O2  dependent, s/p #6 Shiley per ENT -continue levaquin, stop date in place  -brovana + pulmicort BID with duoneb  -solumedrol 40 mg QD -SLP assistance appreciated  -pulmonary hygiene - IS, mobilize  -PRVC 8cc/kg as rest mode, in the short term may need vent at night and trach collar during day  -PSV as tolerated -follow intermittent CXR  -aspiration precaution  GERD -PPI   HTN -monitor off home  lasix, lisinopril   AKI  -Trend BMP / urinary output -Replace electrolytes as indicated -Avoid nephrotoxic agents, ensure adequate renal perfusion  Thrombocytopenia  -follow CBC -monitor for bleeding    Best practice:  Diet: NPO Pain/Anxiety/Delirium protocol (if indicated): Fentanyl prn VAP protocol (if indicated): HOB elevated   DVT prophylaxis: Lovenox GI prophylaxis: PPI Glucose control: SSI Mobility: as tolerated  Code Status: Full code Family Communication: Patient updated on plan of care 11/18 Disposition: ICU  Labs   CBC: Recent Labs  Lab 03/13/20 0849 03/14/20 0824 03/15/20 0105 03/16/20 0613 03/17/20 0245  WBC 10.2 10.6* 8.2 6.9 9.5  NEUTROABS  --   --  7.3 5.5 7.4  HGB 15.0 15.5* 14.6 14.1 14.5  HCT 47.4* 47.8* 45.4 44.8 46.2*  MCV 97.1 98.0 98.3 98.2 99.1  PLT 208 203 146* 121* 112*    Basic Metabolic Panel: Recent Labs  Lab 03/13/20 0849 03/14/20 0824 03/15/20 0105 03/15/20 1621 03/15/20 1728 03/16/20 0613 03/17/20 0245  NA 140 141 141  --   --  144 145  K 4.3 4.5 4.3  --   --  3.9 4.3  CL 98 101 104  --   --  105 104  CO2 32 28 30  --   --  30 32  GLUCOSE 103* 112* 108*  --   --  126* 116*  BUN 37* 39* 37*  --   --  35* 31*  CREATININE 1.16* 1.10* 1.20*  --   --  1.13* 1.10*  CALCIUM 9.3 8.9 8.6*  --   --  8.8* 8.6*  MG  --   --   --  2.7* 2.7* 2.6* 2.6*  PHOS  --   --   --  3.2 3.4 3.2 3.1   GFR: Estimated Creatinine Clearance: 68 mL/min (A) (by C-G formula based on SCr of 1.1 mg/dL (H)). Recent Labs  Lab 03/14/20 0824 03/15/20 0105 03/16/20 0613 03/17/20 0245  WBC 10.6* 8.2 6.9 9.5    Liver Function Tests: No results for input(s): AST, ALT, ALKPHOS, BILITOT, PROT, ALBUMIN in the last 168 hours. No results for input(s): LIPASE, AMYLASE in the last 168 hours. No results for input(s): AMMONIA in the last 168 hours.  ABG    Component Value Date/Time   PHART 7.373 03/14/2020 0936   PCO2ART 55.4 (H) 03/14/2020 0936   PO2ART  108 03/14/2020 0936   HCO3 31.7 (H) 03/14/2020 0936   O2SAT 97.8 03/14/2020 0936     Critical Care Time: 32 minutes   Noe Gens, MSN, NP-C, AGACNP-BC [facility] 03/18/2020, 8:18 AM   Please see [email or web address] for pager details.

## 2020-03-18 NOTE — TOC Progression Note (Signed)
Transition of Care [facility]) - Progression Note    Patient Details  Name: Veronica Horton MRN: [id number] Date of Birth: [birth year]  Transition of [facility]) CM/SW Contact  Leeroy Cha, RN Phone Number: 03/18/2020, 7:54 AM  Clinical Narrative:    59y/o F with PMH of COPD on 5L home O2, DDD and HTN who presented on 11/10 for shortness of breath and was treated initially for a COPD exacerbation on bipap. She underwent SLP evaluation and barium that demonstrated supraglottic soft tissue mass. CT of the neck was performed which demonstrated a bulky soft tissue mass in the supraglottis with narrowing of the airway,involvement of the right laryngeal cartilage with extension to the strap muscles and the right paraglottic space,questionable bilateral cervical adenopathy concerning for regional metastatic disease.  She underwent direct laryngoscopy with biopsy and tracheostomy on 11/13 and required transfer to intensive care.  Oncology was consulted and given she is multiple to get a PET scan while inpatient CT chest/abdomen/pelvis with contrast was ordered.  Recommendations from head and neck cancer specialist are pending and patient may need transfer to [facility] long hospital. On vent at 40% fi02, iv solumedrol and iv levaquin, tube feeds-osmolite/ Plan is unsure at this time due to vent and secretions/ Followings for progression.  Transfer from [place] on [id number] Expected Discharge Plan: [place] Barriers to Discharge: Continued Medical Work up  Expected Discharge Plan and Services Expected Discharge Plan: [place]                                               Social Determinants of Health (SDOH) Interventions    Readmission Risk Interventions Readmission Risk Prevention Plan 01/20/2019  Medication Screening Complete  Transportation Screening Complete  Some recent data might be hidden

## 2020-03-18 NOTE — Progress Notes (Signed)
CPT on hold due to pt in chair.  

## 2020-03-19 ENCOUNTER — Institutional Professional Consult (permissible substitution): Payer: Self-pay | Admitting: Radiation Oncology

## 2020-03-19 ENCOUNTER — Ambulatory Visit
Admit: 2020-03-19 | Discharge: 2020-03-19 | Disposition: A | Discharge status: Home / Self Care | Payer: MEDICAID | Attending: Radiation Oncology | Admitting: Radiation Oncology

## 2020-03-19 ENCOUNTER — Ambulatory Visit
Admit: 2020-03-19 | Discharge: 2020-03-19 | Disposition: A | Discharge status: Home / Self Care | Payer: Medicaid Other | Attending: Radiation Oncology | Admitting: Radiation Oncology

## 2020-03-19 ENCOUNTER — Inpatient Hospital Stay (HOSPITAL_COMMUNITY): Payer: Medicaid Other

## 2020-03-19 DIAGNOSIS — C321 Malignant neoplasm of supraglottis: Secondary | ICD-10-CM

## 2020-03-19 LAB — GLUCOSE, CAPILLARY
Glucose-Capillary: 102 mg/dL — ABNORMAL HIGH (ref 70–99)
Glucose-Capillary: 110 mg/dL — ABNORMAL HIGH (ref 70–99)
Glucose-Capillary: 111 mg/dL — ABNORMAL HIGH (ref 70–99)
Glucose-Capillary: 119 mg/dL — ABNORMAL HIGH (ref 70–99)
Glucose-Capillary: 133 mg/dL — ABNORMAL HIGH (ref 70–99)
Glucose-Capillary: 133 mg/dL — ABNORMAL HIGH (ref 70–99)
Glucose-Capillary: 95 mg/dL (ref 70–99)

## 2020-03-19 LAB — CBC
HCT: 44.3 % (ref 36.0–46.0)
Hemoglobin: 14.1 g/dL (ref 12.0–15.0)
MCH: 31.5 pg (ref 26.0–34.0)
MCHC: 31.8 g/dL (ref 30.0–36.0)
MCV: 98.9 fL (ref 80.0–100.0)
Platelets: 122 10*3/uL — ABNORMAL LOW (ref 150–400)
RBC: 4.48 MIL/uL (ref 3.87–5.11)
RDW: 15 % (ref 11.5–15.5)
WBC: 13.7 10*3/uL — ABNORMAL HIGH (ref 4.0–10.5)
nRBC: 0 % (ref 0.0–0.2)

## 2020-03-19 LAB — PROTIME-INR
INR: 1.1 (ref 0.8–1.2)
Prothrombin Time: 13.3 seconds (ref 11.4–15.2)

## 2020-03-19 LAB — BASIC METABOLIC PANEL
Anion gap: 4 — ABNORMAL LOW (ref 5–15)
BUN: 30 mg/dL — ABNORMAL HIGH (ref 6–20)
CO2: 30 mmol/L (ref 22–32)
Calcium: 8.5 mg/dL — ABNORMAL LOW (ref 8.9–10.3)
Chloride: 106 mmol/L (ref 98–111)
Creatinine, Ser: 0.97 mg/dL (ref 0.44–1.00)
GFR, Estimated: >60 mL/min (ref >60)
Glucose, Bld: 130 mg/dL — ABNORMAL HIGH (ref 70–99)
Potassium: 4.4 mmol/L (ref 3.5–5.1)
Sodium: 140 mmol/L (ref 135–145)

## 2020-03-19 LAB — PHOSPHORUS: Phosphorus: 3.8 mg/dL (ref 2.5–4.6)

## 2020-03-19 LAB — MAGNESIUM: Magnesium: 2.6 mg/dL — ABNORMAL HIGH (ref 1.7–2.4)

## 2020-03-19 MED ORDER — METHYLPREDNISOLONE SODIUM SUCC 40 MG IJ SOLR
30.0000 mg | Freq: Every day | INTRAMUSCULAR | Status: DC
  Administered 2020-03-20 – 2020-03-22 (×3): 30 mg via INTRAVENOUS
  Filled 2020-03-19 (×3): qty 1

## 2020-03-19 NOTE — Plan of Care (Signed)
This RN will continue to monitor patient's progression of care plan.  

## 2020-03-19 NOTE — Progress Notes (Addendum)
PT Cancellation Note  Patient Details Name: Veronica Horton MRN: [id number] DOB: 1960-10-06   Cancelled Treatment:     Planned to see patient after 1pm  then patient left for radiation therapy at the same time. Will attempt to see patient at a different time, schedule permitting.    Juel Burrow, [facility] [phone]

## 2020-03-19 NOTE — Progress Notes (Signed)
SLP Cancellation Note  Patient Details Name: Veronica Horton MRN: [id number] DOB: 01/03/1961   Cancelled treatment:       Reason Eval/Treat Not Completed: Medical issues which prohibited therapy - pt on vent at this time. Will continue efforts.  Darion Juhasz B. Quentin Ore, [facility], [place] Speech Language Pathologist Office: [phone] Pager: [phone]  Shonna Chock 03/19/2020, 9:24 AM

## 2020-03-19 NOTE — Progress Notes (Signed)
Pt continues to have copious amounts of thick, pink-tinged secretions from trach, and white/clear secretions from mouth. This RN and RT suctioned pt through trach, and RN gave pt PRN robitussin. Pt still on trach collar; O2 92%. RN will continue to carefully monitor pt.

## 2020-03-19 NOTE — Progress Notes (Signed)
Removed PT from vent and placed on 28% ATC- tolerating well at this time. RN aware.

## 2020-03-19 NOTE — Progress Notes (Signed)
NAME:  Veronica Horton, MRN:  [id number], DOB:  08-May-1960, LOS: 9 ADMISSION DATE:  03/10/2020, CONSULTATION DATE:  03/19/20 REFERRING MD:  Hospitalist, CHIEF COMPLAINT:  Respiratory failure   Brief History   59 y.o. F with PMH of COPD 11/10 with shortness of breath found to have laryngeal mass concerning for malignancy s/p biopsy and tracheostomy.  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  She was transferred to the intensive care unit post biopsy as she required ventilator support.  Tx to [facility] on 11/17 for ONC therapy.   Past Medical History  COPD Bronchitis  Class III Obesity  DJD  Hiatal Hernia  HTN  Significant Hospital Events   11/10 admit to hospitalists with SOB 11/13 laryngoscopy and biopsy with tracheostomy 11/15 transferred to intensive care unit 11/15 transition to trach collar 11/17 Tx to [facility] for ONC therapy. 24 hours on ATC.   Consults:  ENT Oncology PCCM  Procedures:  ENT Trach 11/13 >>  Significant Diagnostic Tests:  11/11 CT soft tissue neck with contrast >> Bulky bilateral supraglottic tumor with possible airway Compromise, Involvement of right laryngeal cartilages, with extension in the midline anterior to the strap muscles, and also extension through the right paraglottic space and/or inseparable malignant right level IIIa lymph node. 11/15 CT chest/abdomen/pelvis >> no signs of metastatic disease, rounded partially necrotic lymph node to the left neck, basilar consolidative changes similar to prior, small pneumomediastinum  Micro Data:  COVID 11/10 >> negative Influenza 11/10 >> negative  MRSA PCR 11/10 >>  Sputum 11/14 >> serratia >> R-cefazolin, S-ceftriaxone  Antimicrobials:  Azithro 11/10 x1  Clinda 11/10 x1  Doxycycline 11/10 >> 11/14  Levofloxacin 11/14 >>   Interim history/subjective:  Afebrile / WBC 13.7  RN reports pt sat up in chair for 5-6 hours yesterday, tolerated ATC most of day, on vent  at night for comfort No acute events  Planned PEG for 11/22 Glucose range 95-130  Tolerating TF I/O 950 ml UOP, +737  Objective   Blood pressure (!) 123/54, pulse 70, temperature 98 F (36.7 C), temperature source Oral, resp. rate 15, height 5\' 5"  (1.651 m), weight 112.5 kg, SpO2 96 %.    Vent Mode: PRVC FiO2 (%):  [30 %-80 %] 30 % Set Rate:  [14 bmp] 14 bmp Vt Set:  [450 mL] 450 mL PEEP:  [8 cmH20] 8 cmH20 Plateau Pressure:  [12 cmH20-17 cmH20] 12 cmH20   Intake/Output Summary (Last 24 hours) at 03/19/2020 6834 Last data filed at 03/19/2020 0800 Gross per 24 hour  Intake 1687 ml  Output 950 ml  Net 737 ml   Filed Weights   03/16/20 0500 03/17/20 0500 03/18/20 0500  Weight: 112.4 kg 112.5 kg 112.5 kg    Exam: General: adult female lying in bed in NAD on vent sleeping HEENT: MM pink/moist, #6 trach midline c/d/i, anicteric  Neuro: Awake / alert, oriented, communicates by writing  CV: s1s2 rrr, no m/r/g PULM: non-labored on vent, lungs bilaterally clear anterior, diminished at bases GI: soft, bsx4 active  Extremities: warm/dry, trace edema  Skin: no rashes or lesions  PCXR 11/19 >> images personally reviewed, persistent LLL opacity, R atelectasis   Resolved Hospital Problem list     Assessment & Plan:    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  -appreciate ENT, ONC input  -further therapy plan per ONC -plan for PEG 11/22 per IR   Acute on Chronic Hypoxic Respiratory Failure   Tracheostomy Status  COPD on 5L home O2 Serratia PNA, Suspected Aspiration  Baseline 5L O2 dependent, s/p #6 Shiley per ENT -continue levaquin for serratia PNA, stop date in place  -brovana + pulmicort BID with duoneb  -solumedrol 40 mg QD  -pulmonary hygiene -chest PT, mobilize  -Vent support at night as needed, trach collar during day  -follow intermittent CXR  -aspiration precautions -no intubation from above  GERD -PPI   HTN -monitor off home  lasix, lisinopril   AKI  -Trend BMP / urinary output -Replace electrolytes as indicated -Avoid nephrotoxic agents, ensure adequate renal perfusion  Thrombocytopenia  -follow CBC  -monitor for bleeding    Best practice:  Diet: NPO, TF Pain/Anxiety/Delirium protocol (if indicated): Fentanyl prn VAP protocol (if indicated): HOB elevated   DVT prophylaxis: Lovenox GI prophylaxis: PPI Glucose control: SSI Mobility: as tolerated  Code Status: Full code Family Communication: Patient updated on plan of care 11/19 Disposition: ICU, to [facility] as of 11/20.  PCCM will follow for vent / trach assist.   Labs   CBC: Recent Labs  Lab 03/15/20 0105 03/16/20 0613 03/17/20 0245 03/18/20 0909 03/19/20 0254  WBC 8.2 6.9 9.5 11.4* 13.7*  NEUTROABS 7.3 5.5 7.4  --   --   HGB 14.6 14.1 14.5 14.2 14.1  HCT 45.4 44.8 46.2* 45.9 44.3  MCV 98.3 98.2 99.1 102.7* 98.9  PLT 146* 121* 112* 103* 122*    Basic Metabolic Panel: Recent Labs  Lab 03/15/20 0105 03/15/20 1621 03/15/20 1728 03/16/20 0613 03/17/20 0245 03/18/20 0909 03/19/20 0254  NA 141  --   --  144 145 142 140  K 4.3  --   --  3.9 4.3 4.5 4.4  CL 104  --   --  105 104 105 106  CO2 30  --   --  30 32 32 30  GLUCOSE 108*  --   --  126* 116* 123* 130*  BUN 37*  --   --  35* 31* 28* 30*  CREATININE 1.20*  --   --  1.13* 1.10* 0.91 0.97  CALCIUM 8.6*  --   --  8.8* 8.6* 8.5* 8.5*  MG  --    < > 2.7* 2.6* 2.6* 2.8* 2.6*  PHOS  --    < > 3.4 3.2 3.1 3.5 3.8   < > = values in this interval not displayed.   GFR: Estimated Creatinine Clearance: 77.1 mL/min (by C-G formula based on SCr of 0.97 mg/dL). Recent Labs  Lab 03/16/20 0613 03/17/20 0245 03/18/20 0909 03/19/20 0254  WBC 6.9 9.5 11.4* 13.7*    Liver Function Tests: No results for input(s): AST, ALT, ALKPHOS, BILITOT, PROT, ALBUMIN in the last 168 hours. No results for input(s): LIPASE, AMYLASE in the last 168 hours. No results for input(s): AMMONIA in the last 168  hours.  ABG    Component Value Date/Time   PHART 7.373 03/14/2020 0936   PCO2ART 55.4 (H) 03/14/2020 0936   PO2ART 108 03/14/2020 0936   HCO3 31.7 (H) 03/14/2020 0936   O2SAT 97.8 03/14/2020 0936     Critical Care Time: 30 minutes   Noe Gens, MSN, NP-C, AGACNP-BC [place] [facility] 03/19/2020, 8:33 AM   Please see [email or web address] for pager details.

## 2020-03-20 LAB — BASIC METABOLIC PANEL
Anion gap: 10 (ref 5–15)
BUN: 31 mg/dL — ABNORMAL HIGH (ref 6–20)
CO2: 27 mmol/L (ref 22–32)
Calcium: 8.4 mg/dL — ABNORMAL LOW (ref 8.9–10.3)
Chloride: 104 mmol/L (ref 98–111)
Creatinine, Ser: 0.91 mg/dL (ref 0.44–1.00)
GFR, Estimated: >60 mL/min (ref >60)
Glucose, Bld: 86 mg/dL (ref 70–99)
Potassium: 4.6 mmol/L (ref 3.5–5.1)
Sodium: 141 mmol/L (ref 135–145)

## 2020-03-20 LAB — CBC
HCT: 47.6 % — ABNORMAL HIGH (ref 36.0–46.0)
Hemoglobin: 14.8 g/dL (ref 12.0–15.0)
MCH: 31.5 pg (ref 26.0–34.0)
MCHC: 31.1 g/dL (ref 30.0–36.0)
MCV: 101.3 fL — ABNORMAL HIGH (ref 80.0–100.0)
Platelets: 130 10*3/uL — ABNORMAL LOW (ref 150–400)
RBC: 4.7 MIL/uL (ref 3.87–5.11)
RDW: 15.1 % (ref 11.5–15.5)
WBC: 11.8 10*3/uL — ABNORMAL HIGH (ref 4.0–10.5)
nRBC: 0 % (ref 0.0–0.2)

## 2020-03-20 LAB — GLUCOSE, CAPILLARY
Glucose-Capillary: 61 mg/dL — ABNORMAL LOW (ref 70–99)
Glucose-Capillary: 70 mg/dL (ref 70–99)
Glucose-Capillary: 125 mg/dL — ABNORMAL HIGH (ref 70–99)
Glucose-Capillary: 152 mg/dL — ABNORMAL HIGH (ref 70–99)
Glucose-Capillary: 143 mg/dL — ABNORMAL HIGH (ref 70–99)
Glucose-Capillary: 102 mg/dL — ABNORMAL HIGH (ref 70–99)

## 2020-03-20 LAB — PHOSPHORUS: Phosphorus: 4.5 mg/dL (ref 2.5–4.6)

## 2020-03-20 LAB — MAGNESIUM: Magnesium: 2.7 mg/dL — ABNORMAL HIGH (ref 1.7–2.4)

## 2020-03-20 MED ORDER — IPRATROPIUM-ALBUTEROL 0.5-2.5 (3) MG/3ML IN SOLN
3.0000 mL | RESPIRATORY_TRACT | Status: DC
  Administered 2020-03-20 – 2020-03-21 (×10): 3 mL via RESPIRATORY_TRACT
  Filled 2020-03-20 (×10): qty 3

## 2020-03-20 NOTE — Progress Notes (Signed)
Four sutures removed from trach. No other sutures seen.

## 2020-03-20 NOTE — Progress Notes (Signed)
PROGRESS NOTE    Veronica Horton  PQD:[id number] DOB: 06-08-1960 DOA: 03/10/2020 PCP: [facility]   Chief Complain: Shortness of breath  Brief Narrative: Patient is a 59 year old female with history of morbid obesity, hiatal hernia, hypertension, COPD, chronic respiratory failure on home oxygen at 5 L/min, smoker who presents to the emergency room  On 03/10/20 with complaint of shortness of breath, altered mental status.  On presentation ABG showed PCO2 of 92 and she had to be kept on BiPAP.  She was found to have laryngeal mass concerning for malignancy and she underwent biopsy which showed invasive squamous  cell carcinoma and she required tracheostomy.  She had to be transferred to ICU post biopsy and had to be kept on ventilator support.  She was transferred to [place] [facility] on 11/17 for radiation therapy.  Plan is to put the PEG by GI on 03/22/2020.  Transfer to [facility] service from PCCM service on 03/20/2020.  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  Found to have airway obstruction/compromise due to laryngeal mass.  CT soft tissue neck on 11/11 showed bulky bilateral supraglottic tumor with possible airway compromise, involvement of right laryngeal cartilages.  ENT was consulted.  She underwent laryngeal biopsy which showed squamous cell carcinoma.  Status post tracheostomy Undergoing PEG placement by IR on 11/22. CT chest/abdomen/pelvis did not show signs of metastatic  disease. Undergoing radiation therapy.  Plan to start on  cisplatin here.  Acute on chronic hypoxic/hypercarbic respiratory failure: History of COPD, on oxygen at home at 5 L/min.  Chronic respiratory failure exacerbated by laryngeal squamous cell carcinoma, COPD exacerbation, pneumonia.  Hospital course was remarkable for Serratia pneumonia.  Finished the course of antibiotics with levofloxacin.   COPD exacerbation: Being treated with steroids, bronchodilators.  Continue pulmonary hygiene, ventilator support at night, trach collar during the day.  Follow intermittent chest x-ray.  Aspiration precautions.  GERD: Continue PPI  Hypertension: Monitor blood pressure.  Off antihypertensives which she are at home.  AKI: Avoid nephrotoxins.  Continue to monitor BMP.  Thrombocytopenia: Stable.    Nutrition Problem: Increased nutrient needs Etiology: cancer and cancer related treatments, chronic illness      DVT prophylaxis:Lovenox Code Status: Full code Family Communication: None at bedside Status is: Inpatient  Remains inpatient appropriate because:Inpatient level of care appropriate due to severity of illness   Dispo: The patient is from: Home              Anticipated d/c is to: Home              Anticipated d/c date is: > 3 days              Patient currently is not medically stable to d/c.    Consultants: PCCM, ENT, oncology  Procedures: Tracheostomy  Antimicrobials:  Anti-infectives (From admission, onward)   Start     Dose/Rate Route Frequency Ordered Stop   03/14/20 1100  levofloxacin (LEVAQUIN) IVPB 750 mg  750 mg 100 mL/hr over 90 Minutes Intravenous Daily 03/14/20 0955 03/19/20 1146   03/10/20 1730  doxycycline (VIBRAMYCIN) 100 mg in sodium chloride 0.9 % 250 mL IVPB  Status:  Discontinued        100 mg 125 mL/hr over 120 Minutes Intravenous Every 12 hours 03/10/20 1634 03/14/20 0955   03/10/20 1600  azithromycin (ZITHROMAX) 500 mg in sodium chloride 0.9 %  250 mL IVPB  Status:  Discontinued        500 mg 250 mL/hr over 60 Minutes Intravenous Every 24 hours 03/10/20 1529 03/10/20 1538   03/10/20 1500  clindamycin (CLEOCIN) IVPB 600 mg  Status:  Discontinued        600 mg 100 mL/hr over 30 Minutes Intravenous  Once 03/10/20 1458 03/10/20 1521      Subjective: Patient seen and examined the bedside this morning.  Hemodynamically stable.  On trach collar.  Denies any nausea, vomiting, shortness of breath or chest pain.  Comfortable  Objective: Vitals:   03/20/20 0400 03/20/20 0420 03/20/20 0500 03/20/20 0600  BP: (!) 106/47  (!) 100/39 (!) 92/36  Pulse: 62  (!) 58 64  Resp: _0 Temp: 98.1 F (36.7 C)     TempSrc: Oral     SpO2: 95% 97% 97% 92%  Weight:      Height:        Intake/Output Summary (Last 24 hours) at 03/20/2020 9798 Last data filed at 03/20/2020 0600 Gross per 24 hour  Intake 0 ml  Output 1200 ml  Net -1200 ml   Filed Weights   03/16/20 0500 03/17/20 0500 03/18/20 0500  Weight: 112.4 kg 112.5 kg 112.5 kg    Examination:  General exam: Comfortable, morbidly obese HEENT: Trach collar Respiratory system: Bilateral equal air entry, normal vesicular breath sounds, no wheezes or crackles  Cardiovascular system: S1 & S2 heard, RRR. No JVD, murmurs, rubs, gallops or clicks. No pedal edema. Gastrointestinal system: Abdomen is nondistended, soft and nontender. No organomegaly or masses felt. Normal bowel sounds heard. Central nervous system: Alert and oriented. No focal neurological deficits. Extremities: No edema, no clubbing ,no cyanosis Skin: Chronic venous stasis changes on bilateral lower extremities, no ulcers,no icterus ,no pallor   Data Reviewed: I have personally reviewed following labs and imaging studies  CBC: Recent Labs  Lab 03/15/20 0105 03/15/20 0105 03/16/20 9211 03/17/20 0245 03/18/20 0909 03/19/20 0254 03/20/20 0249  WBC 8.2   < > 6.9 9.5 11.4* 13.7* 11.8*  NEUTROABS 7.3  --  5.5 7.4   --   --   --   HGB 14.6   < > 14.1 14.5 14.2 14.1 14.8  HCT 45.4   < > 44.8 46.2* 45.9 44.3 47.6*  MCV 98.3   < > 98.2 99.1 102.7* 98.9 101.3*  PLT 146*   < > 121* 112* 103* 122* 130*   < > = values in this interval not displayed.   Basic Metabolic Panel: Recent Labs  Lab 03/16/20 0613 03/17/20 0245 03/18/20 0909 03/19/20 0254 03/20/20 0249  NA 144 145 142 140 141  K 3.9 4.3 4.5 4.4 4.6  CL 105 104 105 106 104  CO2 30 32 32 30 27  GLUCOSE 126* 116* 123* 130* 86  BUN 35* 31* 28* 30* 31*  CREATININE 1.13* 1.10* 0.91 0.97 0.91  CALCIUM 8.8* 8.6* 8.5* 8.5* 8.4*  MG 2.6* 2.6* 2.8* 2.6* 2.7*  PHOS 3.2 3.1 3.5 3.8 4.5   GFR: Estimated Creatinine Clearance:  82.2 mL/min (by C-G formula based on SCr of 0.91 mg/dL). Liver Function Tests: No results for input(s): AST, ALT, ALKPHOS, BILITOT, PROT, ALBUMIN in the last 168 hours. No results for input(s): LIPASE, AMYLASE in the last 168 hours. No results for input(s): AMMONIA in the last 168 hours. Coagulation Profile: Recent Labs  Lab 03/19/20 0254  INR 1.1   Cardiac Enzymes: No results for input(s): CKTOTAL, CKMB, CKMBINDEX, TROPONINI in the last 168 hours. BNP (last 3 results) No results for input(s): PROBNP in the last 8760 hours. HbA1C: No results for input(s): HGBA1C in the last 72 hours. CBG: Recent Labs  Lab 03/19/20 1630 03/19/20 1939 03/19/20 2321 03/20/20 0324 03/20/20 0355  GLUCAP 133* 111* 119* 61* 70   Lipid Profile: No results for input(s): CHOL, HDL, LDLCALC, TRIG, CHOLHDL, LDLDIRECT in the last 72 hours. Thyroid Function Tests: No results for input(s): TSH, T4TOTAL, FREET4, T3FREE, THYROIDAB in the last 72 hours. Anemia Panel: No results for input(s): VITAMINB12, FOLATE, FERRITIN, TIBC, IRON, RETICCTPCT in the last 72 hours. Sepsis Labs: No results for input(s): PROCALCITON, LATICACIDVEN in the last 168 hours.  Recent Results (from the past 240 hour(s))  Respiratory Panel by RT PCR (Flu A&B, Covid) -  Nasopharyngeal Swab     Status: None   Collection Time: 03/10/20 12:26 PM   Specimen: Nasopharyngeal Swab  Result Value Ref Range Status   SARS Coronavirus 2 by RT PCR NEGATIVE NEGATIVE Final    Comment: (NOTE) SARS-CoV-2 target nucleic acids are NOT DETECTED.  The SARS-CoV-2 RNA is generally detectable in upper respiratoy specimens during the acute phase of infection. The lowest concentration of SARS-CoV-2 viral copies this assay can detect is 131 copies/mL. A negative result does not preclude SARS-Cov-2 infection and should not be used as the sole basis for treatment or other patient management decisions. A negative result may occur with  improper specimen collection/handling, submission of specimen other than nasopharyngeal swab, presence of viral mutation(s) within the areas targeted by this assay, and inadequate number of viral copies (<131 copies/mL). A negative result must be combined with clinical observations, patient history, and epidemiological information. The expected result is Negative.  Fact Sheet for Patients:  PinkCheek.be  Fact Sheet for Healthcare Providers:  GravelBags.it  This test is no t yet approved or cleared by the [place] FDA and  has been authorized for detection and/or diagnosis of SARS-CoV-2 by FDA under an Emergency Use Authorization (EUA). This EUA will remain  in effect (meaning this test can be used) for the duration of the COVID-19 declaration under Section 564(b)(1) of the Act, 21 U.S.C. section 360bbb-3(b)(1), unless the authorization is terminated or revoked sooner.     Influenza A by PCR NEGATIVE NEGATIVE Final   Influenza B by PCR NEGATIVE NEGATIVE Final    Comment: (NOTE) The Xpert Xpress SARS-CoV-2/FLU/RSV assay is intended as an aid in  the diagnosis of influenza from Nasopharyngeal swab specimens and  should not be used as a sole basis for treatment. Nasal washings and   aspirates are unacceptable for Xpert Xpress SARS-CoV-2/FLU/RSV  testing.  Fact Sheet for Patients: PinkCheek.be  Fact Sheet for Healthcare Providers: GravelBags.it  This test is not yet approved or cleared by the [place] FDA and  has been authorized for detection and/or diagnosis of SARS-CoV-2 by  FDA under an Emergency Use Authorization (EUA). This EUA will remain  in effect (meaning this test can be used) for the duration of the  Covid-19 declaration under Section 564(b)(1) of the  Act, 21  U.S.C. section 360bbb-3(b)(1), unless the authorization is  terminated or revoked. Performed at [facility], [address], [place], [place] [address]   MRSA PCR Screening     Status: None   Collection Time: 03/10/20  5:08 PM   Specimen: Nasopharyngeal  Result Value Ref Range Status   MRSA by PCR NEGATIVE NEGATIVE Final    Comment:        The GeneXpert MRSA Assay (FDA approved for NASAL specimens only), is one component of a comprehensive MRSA colonization surveillance program. It is not intended to diagnose MRSA infection nor to guide or monitor treatment for MRSA infections. Performed at [facility], [address], [place], [place] [address]   Culture, respiratory     Status: None   Collection Time: 03/14/20 11:36 AM   Specimen: Tracheal Aspirate  Result Value Ref Range Status   Specimen Description TRACHEAL ASPIRATE  Final   Special Requests NONE  Final   Gram Stain   Final    NO WBC SEEN NO ORGANISMS SEEN Performed at [facility], [address], [place],  [address]    Culture RARE SERRATIA MARCESCENS  Final   Report Status 03/16/2020 FINAL  Final   Organism ID, Bacteria SERRATIA MARCESCENS  Final      Susceptibility   Serratia marcescens - MIC*    CEFAZOLIN >=64 RESISTANT Resistant     CEFEPIME <=0.12 SENSITIVE Sensitive     CEFTAZIDIME <=1 SENSITIVE Sensitive     CEFTRIAXONE <=0.25  SENSITIVE Sensitive     CIPROFLOXACIN <=0.25 SENSITIVE Sensitive     GENTAMICIN <=1 SENSITIVE Sensitive     TRIMETH/SULFA <=20 SENSITIVE Sensitive     * RARE SERRATIA MARCESCENS         Radiology Studies: DG CHEST PORT 1 VIEW  Result Date: 03/19/2020 CLINICAL DATA:  Pneumonia. EXAM: PORTABLE CHEST 1 VIEW COMPARISON:  03/18/2020. FINDINGS: Tracheostomy tube, feeding tube in stable position. Stable cardiomegaly. Improved pulmonary venous congestion. Lung volumes. Persistent left base atelectasis/infiltrate. Persistent right base subsegmental atelectasis. Elliptical density noted over the right mid chest may represent atelectasis and or fissural fluid. Pleural effusions cannot be excluded. No pneumothorax. IMPRESSION: 1. Lines and tubes in stable position. 2. Stable cardiomegaly. Improved pulmonary venous congestion. 3. Persistent left base atelectasis/infiltrate. Persistent right base subsegmental atelectasis. Elliptical density noted over the right mid chest may represent atelectasis and or fissural fluid. Small pleural effusions cannot be excluded. Electronically Signed   By: Marcello Moores  Register   On: 03/19/2020 06:03        Scheduled Meds: . arformoterol  15 mcg Nebulization BID  . budesonide (PULMICORT) nebulizer solution  0.5 mg Nebulization BID  . chlorhexidine gluconate (MEDLINE KIT)  15 mL Mouth Rinse BID  . Chlorhexidine Gluconate Cloth  6 each Topical Daily  . docusate  100 mg Per Tube BID  . enoxaparin (LOVENOX) injection  55 mg Subcutaneous Q24H  . feeding supplement (PROSource TF)  45 mL Per Tube BID  . insulin aspart  0-9 Units Subcutaneous Q4H  . ipratropium-albuterol  3 mL Nebulization Q6H  . mouth rinse  15 mL Mouth Rinse 10 times per day  . methylPREDNISolone (SOLU-MEDROL) injection  30 mg Intravenous Daily  . multivitamin with minerals  1 tablet Per Tube Daily  . nicotine  21 mg Transdermal Daily  . pantoprazole (PROTONIX) IV  40 mg Intravenous Q24H   Continuous  Infusions: . sodium chloride    . feeding supplement (OSMOLITE 1.5 CAL) 1,000 mL (  03/19/20 0507)     LOS: 10 days    Time spent:35 mins. More than 50% of that time was spent in counseling and/or coordination of care.      Shelly Coss, MD [facility] P11/20/2021, 7:14 AM

## 2020-03-21 LAB — GLUCOSE, CAPILLARY
Glucose-Capillary: 109 mg/dL — ABNORMAL HIGH (ref 70–99)
Glucose-Capillary: 116 mg/dL — ABNORMAL HIGH (ref 70–99)
Glucose-Capillary: 118 mg/dL — ABNORMAL HIGH (ref 70–99)
Glucose-Capillary: 122 mg/dL — ABNORMAL HIGH (ref 70–99)
Glucose-Capillary: 124 mg/dL — ABNORMAL HIGH (ref 70–99)
Glucose-Capillary: 156 mg/dL — ABNORMAL HIGH (ref 70–99)
Glucose-Capillary: 93 mg/dL (ref 70–99)

## 2020-03-21 MED ORDER — FENTANYL CITRATE (PF) 100 MCG/2ML IJ SOLN
25.0000 ug | Freq: Once | INTRAMUSCULAR | Status: AC
  Administered 2020-03-21: 25 ug via INTRAVENOUS
  Filled 2020-03-21: qty 2

## 2020-03-21 MED ORDER — CHLORHEXIDINE GLUCONATE 0.12 % MT SOLN
OROMUCOSAL | Status: AC
  Administered 2020-03-21: 15 mL via OROMUCOSAL
  Filled 2020-03-21: qty 15

## 2020-03-21 MED ORDER — MAGIC MOUTHWASH W/LIDOCAINE
10.0000 mL | Freq: Four times a day (QID) | ORAL | Status: DC | PRN
  Administered 2020-03-21 – 2020-04-12 (×28): 10 mL via ORAL
  Administered 2020-04-13: 5 mL via ORAL
  Administered 2020-04-13 – 2020-04-15 (×4): 10 mL via ORAL
  Filled 2020-03-21 (×37): qty 10

## 2020-03-21 NOTE — Progress Notes (Signed)
Radiation Oncology         [phone] ________________________________  Initial Inpatient Consultation  Name: Veronica Horton MRN: [id number]  Date: 03/19/2020  DOB: 1960-08-20  CC:[facility]   REFERRING PHYSICIAN: [facility]  DIAGNOSIS:    ICD-10-CM   1. 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 female who presented to the ED on 03/10/2020 with chief complaint of shortness of breath. She was admitted to the hospital for further management of acute on chronic respiratory failure with hypoxia and hypercapnia, cellulitis of the right lower extremity, and altered mental status.  While admitted to the hospital, the patient reported difficulty swallowing over the last couple of weeks. Thus, she underwent a soft tissue neck CT scan on 03/11/2020 that showed a bulky bilateral supraglottic tumor with possible airway compromise. There was involvement of the right laryngeal cartilages with extension in the midline anterior to the strap muscles as well as extension through the right paraglottic space and/or inseparable malignant right level IIIa lymph node. The estimated tumor along axis was 5.2 cm. There was also noted to be malignancy contralateral left level 3b lymph node that was 2.9 cm in long axis in addition to smaller indeterminate left level 4 lymph nodes. There was no distant metastatic disease identified in the neck or upper chest.  Given the above findings, the patient underwent a direct laryngoscopy with biopsy and awake tracheostomy on the date of 03/13/2020. Pathology from  the procedure revealed invasive moderately differentiated squamous cell carcinoma of the central base of the tongue that was focally suspicious for lymphovascular invasion. There was also invasive moderately differentiated squamous cell carcinoma identified in the lingual surface of the epiglottis and right false vocal cord.  CT scan of chest/abdomen/pelvis on the date of 03/15/2020 showed a supraglottic mass with a rounded, partially necrotic lymph node in the left neck. There were also noted to be basilar consolidative changes bilaterally with a similar pattern of consolidation, though decreased volume loss when compared to prior study in 2020; possibly related to aspiration. There were no definite lesions seen in the chest, though the above areas could be obscure underlying lesions. Additionally, there were postoperative changes with a small amount of pneumomediastinum presumably related to tracheostomy tube insertion. Finally, there were ununited fractures along the posterior left chest involving ribs 10-12 that appeared subacute or chronic. There were no signs of metastatic disease to the chest, abdomen, or pelvis.  The candidate's case was discussed at tumor board on 03/17/2020, during which time concurrent chemoradiation was recommended. She has discussed surgery with ENT and adamantly declined that.  She has not smoked since admission, h/o smoking 1ppd previously.   PREVIOUS RADIATION THERAPY: No  PAST MEDICAL HISTORY:  has a past medical history of Bronchitis, Class 3 obesity (01/23/2020), COPD (chronic obstructive pulmonary disease) ([place]), Degenerative disc disease, lumbar, Hiatal hernia, and Hypertension.    PAST SURGICAL HISTORY: Past Surgical History:  Procedure Laterality Date  . CHOLECYSTECTOMY    . degenerative bone disease    . DIRECT LARYNGOSCOPY N/A 03/13/2020   Procedure: DIRECT LARYNGOSCOPY WITH BIOPSY;  Surgeon:  Skotnicki, Meghan A, DO;  Location: [place];  Service: ENT;   Laterality: N/A;  . INCISIONAL HERNIA REPAIR N/A 01/15/2019   Procedure: [name] HERNIORRHAPHY WITH MESH;  Surgeon: [name], MD;  Location: [facility];  Service: General;  Laterality: N/A;  . OMENTECTOMY N/A 01/15/2019   Procedure: OMENTECTOMY;  Surgeon: [name], MD;  Location: [facility];  Service: General;  Laterality: N/A;  . TRACHEOSTOMY TUBE PLACEMENT N/A 03/13/2020   Procedure: AWAKE TRACHEOSTOMY;  Surgeon: Jason Coop, DO;  Location: [place];  Service: ENT;  Laterality: N/A;    FAMILY HISTORY: family history includes Hypertension in her mother.  SOCIAL HISTORY:  reports that she has been smoking. She has been smoking about 1.00 pack per day. She has never used smokeless tobacco. She reports that she does not drink alcohol and does not use drugs.  ALLERGIES: Codeine and Penicillins  MEDICATIONS:  No current facility-administered medications for this encounter.   No current outpatient medications on file.   Facility-Administered Medications Ordered in Other Encounters  Medication Dose Route Frequency Provider Last Rate Last Admin  . 0.9 %  sodium chloride infusion   Intravenous PRN Mannam, Praveen, MD      . arformoterol (BROVANA) nebulizer solution 15 mcg  15 mcg Nebulization BID Rigoberto Noel, MD   15 mcg at 03/21/20 2038  . budesonide (PULMICORT) nebulizer solution 0.5 mg  0.5 mg Nebulization BID Rigoberto Noel, MD   0.5 mg at 03/21/20 2038  . chlorhexidine gluconate (MEDLINE KIT) (PERIDEX) 0.12 % solution 15 mL  15 mL Mouth Rinse BID Oretha Milch D, MD   15 mL at 03/21/20 2100  . Chlorhexidine Gluconate Cloth 2 % PADS 6 each  6 each Topical Daily Elgergawy, Silver Huguenin, MD   6 each at 03/21/20 1800  . docusate (COLACE) 50 MG/5ML liquid 100 mg  100 mg Per Tube BID Mannam, Praveen, MD   100 mg at 03/21/20 2123  . feeding supplement (OSMOLITE 1.5 CAL) liquid 1,000 mL  1,000 mL Per Tube Continuous Gleason, Otilio Carpen, PA-C 60 mL/hr at 03/22/20 0000 Rate Verify at 03/22/20  0000  . feeding supplement (PROSource TF) liquid 45 mL  45 mL Per Tube BID Gleason, Otilio Carpen, PA-C   45 mL at 03/21/20 2123  . fentaNYL (SUBLIMAZE) injection 25 mcg  25 mcg Intravenous Q2H PRN Oretha Milch D, MD   25 mcg at 03/22/20 0355  . guaiFENesin-dextromethorphan (ROBITUSSIN DM) 100-10 MG/5ML syrup 5 mL  5 mL Per Tube Q4H PRN Mannam, Praveen, MD   5 mL at 03/19/20 1712  . insulin aspart (novoLOG) injection 0-9 Units  0-9 Units Subcutaneous Q4H Gleason, Otilio Carpen, PA-C   1 Units at 03/21/20 2051  . ipratropium-albuterol (DUONEB) 0.5-2.5 (3) MG/3ML nebulizer solution 3 mL  3 mL Nebulization Q6H PRN Kathie Dike, MD   3 mL at 03/14/20 0817  . ipratropium-albuterol (DUONEB) 0.5-2.5 (3) MG/3ML nebulizer solution 3 mL  3 mL Nebulization Q6H Adhikari, Amrit, MD      . magic mouthwash w/lidocaine  10 mL Oral QID PRN Lovey Newcomer T, NP   10 mL at 03/22/20 0052  . MEDLINE mouth rinse  15 mL Mouth Rinse 10 times per day Oretha Milch D, MD   15 mL at 03/22/20 0414  . methylPREDNISolone sodium succinate (SOLU-MEDROL) 40 mg/mL injection 30 mg  30 mg Intravenous Daily Mannam, Praveen, MD   30 mg at 03/21/20 0927  . multivitamin with minerals tablet 1 tablet  1 tablet  Per Tube Daily Efraim Kaufmann, RPH   1 tablet at 03/21/20 0737  . nicotine (NICODERM CQ - dosed in mg/24 hours) patch 21 mg  21 mg Transdermal Daily Kathie Dike, MD   21 mg at 03/21/20 0926  . pantoprazole (PROTONIX) injection 40 mg  40 mg Intravenous Q24H Elgergawy, Silver Huguenin, MD   40 mg at 03/21/20 1508    REVIEW OF SYSTEMS:  Notable for that above.   PHYSICAL EXAM:  vitals were not taken for this visit.   General: Alert and oriented, in no acute distress, lying on gurney HEENT: Head is normocephalic. Oropharynx is notable for no thrush or obvious lesions in mouth/throat (upper).  Poor dentition.  Trach intact. Neck: Neck is notable for palpable cervical adenopathy bilaterally (exam limited by patient position and trach  collar) Extremities: ankle edema bilaterally Psychiatric: alert and oriented, conversant  ECOG = 3  0 - Asymptomatic (Fully active, able to carry on all predisease activities without restriction)  1 - Symptomatic but completely ambulatory (Restricted in physically strenuous activity but ambulatory and able to carry out work of a light or sedentary nature. For example, light housework, office work)  2 - Symptomatic, <50% in bed during the day (Ambulatory and capable of all self care but unable to carry out any work activities. Up and about more than 50% of waking hours)  3 - Symptomatic, >50% in bed, but not bedbound (Capable of only limited self-care, confined to bed or chair 50% or more of waking hours)  4 - Bedbound (Completely disabled. Cannot carry on any self-care. Totally confined to bed or chair)  5 - Death   Eustace Pen MM, Creech RH, Tormey DC, et al. [phone]). "Toxicity and response criteria of the [facility]". [place] Oncol. 5 (6): 649-55   LABORATORY DATA:  Lab Results  Component Value Date   WBC 11.8 (H) 03/20/2020   HGB 14.8 03/20/2020   HCT 47.6 (H) 03/20/2020   MCV 101.3 (H) 03/20/2020   PLT 130 (L) 03/20/2020   CMP     Component Value Date/Time   NA 141 03/20/2020 0249   K 4.6 03/20/2020 0249   CL 104 03/20/2020 0249   CO2 27 03/20/2020 0249   GLUCOSE 86 03/20/2020 0249   BUN 31 (H) 03/20/2020 0249   CREATININE 0.91 03/20/2020 0249   CALCIUM 8.4 (L) 03/20/2020 0249   PROT 7.3 03/10/2020 1226   ALBUMIN 3.6 03/10/2020 1226   AST 14 (L) 03/10/2020 1226   ALT 12 03/10/2020 1226   ALKPHOS 79 03/10/2020 1226   BILITOT 0.8 03/10/2020 1226   GFRNONAA >60 03/20/2020 0249   GFRAA >60 01/24/2020 0425      No results found for: TSH   RADIOGRAPHY: CT SOFT TISSUE NECK W CONTRAST  Addendum Date: 03/11/2020   ADDENDUM REPORT: 03/11/2020 16:36 ADDENDUM: Study discussed by telephone with Dr. Jolaine Artist MEMON on 03/11/2020 at 1631 hours.  Electronically Signed   By: Genevie Ann M.D.   On: 03/11/2020 16:36   Result Date: 03/11/2020 CLINICAL DATA:  59 year old female with dysphagia. Tonsillitis suspected. EXAM: CT NECK WITH CONTRAST TECHNIQUE: Multidetector CT imaging of the neck was performed using the standard protocol following the bolus administration of intravenous contrast. CONTRAST:  3m OMNIPAQUE IOHEXOL 300 MG/ML  SOLN COMPARISON:  Chest CT 01/28/2019. FINDINGS: Pharynx and larynx: Bulky soft tissue mass throughout the bilateral supraglottic larynx and affecting the hypopharynx. Lobulated diffuse thickening of the epiglottis (series 2, image 53). Diffuse false cord  and anterior commissure involvement with evidence of extension into the right paraglottic space on series 2 image 61 - where nodular direct extension of tumor and/or inseparable abnormal right level 3 lymph node protrudes on coronal image 58. Asymmetric erosion of the undersurface of the right thyroid cartilage on image 65. Asymmetric sclerosis of the right arytenoid on image 67., and midline extension of tumor is suspected through the anterior commissure a distance of about 11 mm as seen on series 2, image 67 and sagittal image 48. All told, tumor size is estimated at 37 x 52 by 45 mm (AP by transverse by CC). There is evidence of airway compromise. The true cords may be spared as seen on series 2, image 71. Subglottic larynx is within normal limits. Above the vallecula pharyngeal contours appear normal. Normal superior parapharyngeal and retropharyngeal spaces. Salivary glands: Negative sublingual space. Submandibular glands and parotid glands remain within normal limits. Thyroid: Negative. Lymph nodes: Malignant left level IIIb lymph node measures 17 mm short axis and 29 mm long axis (series 2, image 55 and coronal image 73). As stated above it is possible of malignant right level IIIa lymph node is inseparable from the parent tumor along the right paraglottic space (coronal  image 58). Smaller but asymmetrically enlarged left level 4 nodes measure up to 9 mm short axis (coronal image 72). No other abnormal or suspicious lymph nodes identified. Vascular: Major vascular structures in the neck and at the skull base remain patent including both internal jugular veins. The left vertebral artery appears dominant. Calcified atherosclerosis at the skull base. Limited intracranial: Negative. Visualized orbits: Negative. Mastoids and visualized paranasal sinuses: Clear. Skeleton: Absent and carious dentition. Cervical spine degeneration. No suspicious osseous lesion identified. 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 No superior mediastinal lymphadenopathy. Visible axillary lymph nodes are normal. Mild dependent atelectasis.  No upper lung nodule identified. IMPRESSION: 1. 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 Recommend ENT consultation. Involvement of right laryngeal cartilages, with extension in the midline anterior to the strap muscles, and also extension through the right paraglottic space and/or inseparable malignant right level IIIa lymph node. Estimated tumor long axis 5.2 cm. 2. Malignant contralateral left level 3b lymph node is 2.9 cm long axis. Smaller indeterminate left level 4 lymph nodes. 3. No distant metastatic disease identified in the neck or upper chest. Electronically Signed: By: Genevie Ann M.D. On: 03/11/2020 16:23   CT CHEST ABDOMEN PELVIS W CONTRAST  Result Date: 03/15/2020 CLINICAL DATA:  New diagnosis of head neck mass EXAM: CT CHEST, ABDOMEN, AND PELVIS WITH CONTRAST TECHNIQUE: Multidetector CT imaging of the chest, abdomen and pelvis was performed following the standard protocol during bolus administration of intravenous contrast. CONTRAST:  142m OMNIPAQUE IOHEXOL 300 MG/ML  SOLN COMPARISON:  Prior swallowing evaluation and CT of the neck. FINDINGS: CT CHEST FINDINGS Cardiovascular: Calcified coronary  artery disease. Aorta is normal caliber. 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 Central pulmonary vasculature is mildly engorged. Approximately 3 cm greatest caliber unchanged from previous exam. Unremarkable on limited venous phase assessment. Mediastinum/Nodes: Supraglottic mass partially imaged on the first image of the data set. Interval placement of tracheostomy tube with resultant pneumomediastinum in the anterior mediastinum in upper mediastinum. No adenopathy within the mediastinum or axilla. No hilar adenopathy. Rounded partial necrotic lymph node in the LEFT neck partially visualized, corresponding to level IIIb lymph node seen on the neck CT. Lungs/Pleura: Basilar consolidative changes bilaterally with similar pattern  of consolidation though decreased volume loss when compared to the study of September of 2020. Airways are patent. Musculoskeletal: See below for full musculoskeletal detail. CT ABDOMEN PELVIS FINDINGS Hepatobiliary: Liver without focal lesion. Post cholecystectomy. No biliary duct dilation. Pancreas: Mild atrophy of the pancreas without focal lesion, ductal dilation or inflammation. Spleen: Spleen normal in size and contour. Adrenals/Urinary Tract: Adrenal glands are normal. Symmetric renal enhancement. No hydronephrosis. LEFT renal cysts, largest arising from the lower pole measuring 2.8 x 2.9 cm. Urinary bladder under distended limiting assessment. Stomach/Bowel: Gastrointestinal tract with signs of colonic diverticulosis. No acute bowel process. Rectus diastasis. Postoperative changes in the midline of the abdomen related to prior ventral hernia repair. Mild bulging at the site of hernia repair, no herniation beyond the inserted mesh near the umbilicus. Vascular/Lymphatic: Calcified atheromatous plaque in the abdominal aorta. No aneurysmal dilation. There is no gastrohepatic or hepatoduodenal ligament lymphadenopathy. No retroperitoneal or  mesenteric lymphadenopathy. No pelvic sidewall lymphadenopathy. Reproductive: No adnexal mass. Reproductive structures are unremarkable. Other: Post abdominal wall reconstruction with rectus diastasis, no frank hernia. Musculoskeletal: Spinal degenerative changes. Degenerative changes in glenohumeral joints and hips. Ununited fractures along posterior LEFT chest involving ribs 10 through 12. These appear subacute or chronic. These did not appear to be present on previous imaging from September of 2020. IMPRESSION: 1. Supraglottic mass partially imaged on the first image of the data set. No signs of metastatic disease to the chest, abdomen or pelvis. 2. Rounded partially necrotic lymph node in the LEFT neck partially visualized, corresponding to level IIIb lymph node seen on the neck CT. 3. Basilar consolidative changes bilaterally with similar pattern of consolidation though decreased volume loss when compared to the study of September of 2020. Findings may be related to aspiration. No definite lesions seen in the chest though these areas could obscure underlying lesions. 4. Postoperative changes with small amount of pneumomediastinum presumably related to recent tracheostomy tube insertion. 5. Ununited fractures along posterior LEFT chest involving ribs 10 through 12. These appear subacute or chronic. These did not appear to be present on previous imaging from September of 2020. 6. Post abdominal wall reconstruction with rectus diastasis, no frank hernia. 7. Aortic atherosclerosis. Aortic Atherosclerosis (ICD10-I70.0). Electronically Signed   By: Zetta Bills M.D.   On: 03/15/2020 10:53   DG CHEST PORT 1 VIEW  Result Date: 03/19/2020 CLINICAL DATA:  Pneumonia. EXAM: PORTABLE CHEST 1 VIEW COMPARISON:  03/18/2020. FINDINGS: Tracheostomy tube, feeding tube in stable position. Stable cardiomegaly. Improved pulmonary venous congestion. Lung volumes. Persistent left base atelectasis/infiltrate. Persistent right  base subsegmental atelectasis. Elliptical density noted over the right mid chest may represent atelectasis and or fissural fluid. Pleural effusions cannot be excluded. No pneumothorax. IMPRESSION: 1. Lines and tubes in stable position. 2. Stable cardiomegaly. Improved pulmonary venous congestion. 3. Persistent left base atelectasis/infiltrate. Persistent right base subsegmental atelectasis. Elliptical density noted over the right mid chest may represent atelectasis and or fissural fluid. Small pleural effusions cannot be excluded. Electronically Signed   By: Marcello Moores  Register   On: 03/19/2020 06:03   DG Chest Port 1 View  Result Date: 03/18/2020 CLINICAL DATA:  Respiratory failure. EXAM: PORTABLE CHEST 1 VIEW COMPARISON:  03/17/2020. FINDINGS: Tracheostomy tube and feeding tube in stable position. Cardiomegaly. Mild pulmonary venous congestion. Mild bilateral interstitial prominence. Mild component of CHF cannot be excluded. Persistent left lower lobe atelectasis/infiltrate. New onset right mid lung prominent atelectatic changes. No pneumothorax. IMPRESSION: 1. Tracheostomy tube and feeding tube in stable position. 2. Cardiomegaly with  mild pulmonary venous congestion and mild bilateral interstitial prominence. Mild component of CHF cannot be excluded. 3. Persistent left lower lobe atelectasis/infiltrate. New onset of right mid lung prominent atelectatic changes. Electronically Signed   By: Marcello Moores  Register   On: 03/18/2020 07:34   DG CHEST PORT 1 VIEW  Result Date: 03/17/2020 CLINICAL DATA:  Respiratory failure EXAM: PORTABLE CHEST 1 VIEW COMPARISON:  March 14, 2020 FINDINGS: Tracheostomy catheter tip is 6.2 cm above the carina. Feeding tube tip is below the diaphragm. No pneumothorax. There is a small left pleural effusion with consolidation in the left lower lobe. There is right base atelectasis with equivocal small right pleural effusion. Heart is mildly enlarged, stable, with pulmonary vascularity  normal. No adenopathy. No bone lesions. IMPRESSION: Tube positions as described without pneumothorax. Left pleural effusion with equivocal right pleural effusion. Airspace opacity consistent with combination of atelectasis and pneumonia left lower lobe. Mild atelectasis right base. Stable cardiac prominence. Electronically Signed   By: Lowella Grip III M.D.   On: 03/17/2020 09:02   DG CHEST PORT 1 VIEW  Result Date: 03/14/2020 CLINICAL DATA:  Respiratory failure with hypercapnia EXAM: PORTABLE CHEST 1 VIEW COMPARISON:  March 10, 2020 FINDINGS: Tracheostomy catheter tip is 6.2 cm above the carina. There is ill-defined airspace opacity in the left upper lobe and left base regions with equivocal left pleural effusion. The right lung is clear. Heart is upper normal in size with pulmonary vascularity normal. No adenopathy. No bone lesions. IMPRESSION: Tracheostomy as described without pneumothorax. Patchy airspace opacity consistent with scattered areas of pneumonia in the left upper lobe and left base. Equivocal left pleural effusion. Right lung clear. Stable cardiac silhouette. Electronically Signed   By: Lowella Grip III M.D.   On: 03/14/2020 09:17   DG Chest Portable 1 View  Result Date: 03/10/2020 CLINICAL DATA:  Shortness of breath and cough EXAM: PORTABLE CHEST 1 VIEW COMPARISON:  January 23, 2020 FINDINGS: There is bibasilar atelectatic change. Elsewhere the interstitium is mildly thickened. No consolidation. Heart size and pulmonary vascularity are normal. No adenopathy. There is degenerative change in each shoulder. IMPRESSION: Bibasilar atelectasis. Interstitial thickening likely represents a degree of underlying chronic bronchitis. No edema or airspace opacity. Heart size within normal limits. Electronically Signed   By: Lowella Grip III M.D.   On: 03/10/2020 12:42   DG Swallowing Func-Speech Pathology  Result Date: 03/11/2020 Objective Swallowing Evaluation: Type of Study:  MBS-Modified Barium Swallow Study  Patient Details Name: Veronica Horton MRN: [id number] Date of Birth: 04-Mar-1961 Today's Date: 03/11/2020 Time: SLP Start Time (ACUTE ONLY): 4627 -SLP Stop Time (ACUTE ONLY): 1316 SLP Time Calculation (min) (ACUTE ONLY): 21 min Past Medical History: Past Medical History: Diagnosis Date . Bronchitis  . Class 3 obesity 01/23/2020 . COPD (chronic obstructive pulmonary disease) ([place])  . Degenerative disc disease, lumbar  . Hiatal hernia  . Hypertension  Past Surgical History: Past Surgical History: Procedure Laterality Date . CHOLECYSTECTOMY   . degenerative bone disease   . INCISIONAL HERNIA REPAIR N/A 01/15/2019  Procedure: [name] HERNIORRHAPHY WITH MESH;  Surgeon: [name], MD;  Location: [facility];  Service: General;  Laterality: N/A; . OMENTECTOMY N/A 01/15/2019  Procedure: OMENTECTOMY;  Surgeon: [name], MD;  Location: [facility];  Service: General;  Laterality: N/A; HPI: Veronica Horton  is a 59 y.o. female, with medical history significant of class III obesity, hiatal hernia, hypertension, bronchitis, COPD, chronic respiratory failure on home oxygen at 5 LPM, active smoker of 1-1/2 packs of  cigarettes per day , patient presents to ED secondary to shortness of breath and altered mental status, patient with receptive worsening dyspnea over the last 4 days, she is not vaccinated against Covid, she is altered at the time of my examination, history was obtained from boyfriend at bedside, and ED staff, patient still smoking, she still using 5 L of oxygen, he is with increased work of breathing, significantly dyspneic, upon presentation to ED she is altered not provide any complaints at this point. BSE requested.  Subjective: "I haven't been able to swallow my pills for a couple weeks." Assessment / Plan / Recommendation CHL IP CLINICAL IMPRESSIONS 03/11/2020 Clinical Impression Pt presents with mild pharyngeal phase dysphagia, however appearance of anatomy appears edematous  (epiglottis and aryepiglottic folds). Pt with limited dentition and requires extra time to masticate solids, swallow trigger generally at the level of the valleculae, Pt with blunted appearance of epiglottis with reduced deflection resulting in variable trace, flash penetration of thins during the swallow without aspiration and min vallecular residue with solids and brief stasis of barium tablet in valleculae. Pt with prominent cricopharyngeus. Recommend D3/mech soft and thin liquids, po medications whole in puree and follow with liquid wash or per Pt preference. Also strongly recommend additional imaging (neck CT) and ENT consult pending those results. SLP will follow pending results of imaging. Above to RN and MD.  SLP Visit Diagnosis Dysphagia, oropharyngeal phase (R13.12) Attention and concentration deficit following -- Frontal lobe and executive function deficit following -- Impact on safety and function Mild aspiration risk   CHL IP TREATMENT RECOMMENDATION 03/11/2020 Treatment Recommendations No treatment recommended at this time   Prognosis 03/11/2020 Prognosis for Safe Diet Advancement Fair Barriers to Reach Goals Severity of deficits Barriers/Prognosis Comment -- [facility]L IP DIET RECOMMENDATION 03/11/2020 SLP Diet Recommendations Dysphagia 3 (Mech soft) solids;Thin liquid Liquid Administration via Cup;Straw Medication Administration Whole meds with puree Compensations Slow rate;Small sips/bites Postural Changes Remain semi-upright after after feeds/meals (Comment);Seated upright at 90 degrees   CHL IP OTHER RECOMMENDATIONS 03/11/2020 Recommended Consults Consider ENT evaluation Oral Care Recommendations Oral care BID Other Recommendations Clarify dietary restrictions   CHL IP FOLLOW UP RECOMMENDATIONS 03/11/2020 Follow up Recommendations None   [facility] IP FREQUENCY AND DURATION 03/11/2020 Speech Therapy Frequency (ACUTE ONLY) min 2x/week Treatment Duration 1 week      CHL IP ORAL PHASE 03/11/2020 Oral Phase WFL  Oral - Pudding Teaspoon -- Oral - Pudding Cup -- Oral - Honey Teaspoon -- Oral - Honey Cup -- Oral - Nectar Teaspoon -- Oral - Nectar Cup -- Oral - Nectar Straw -- Oral - Thin Teaspoon -- Oral - Thin Cup -- Oral - Thin Straw -- Oral - Puree -- Oral - Mech Soft -- Oral - Regular -- Oral - Multi-Consistency -- Oral - Pill -- Oral Phase - Comment --  CHL IP PHARYNGEAL PHASE 03/11/2020 Pharyngeal Phase Impaired Pharyngeal- Pudding Teaspoon -- Pharyngeal -- Pharyngeal- Pudding Cup -- Pharyngeal -- Pharyngeal- Honey Teaspoon -- Pharyngeal -- Pharyngeal- Honey Cup -- Pharyngeal -- Pharyngeal- Nectar Teaspoon -- Pharyngeal -- Pharyngeal- Nectar Cup -- Pharyngeal -- Pharyngeal- Nectar Straw Pharyngeal residue - valleculae;Reduced epiglottic inversion Pharyngeal -- Pharyngeal- Thin Teaspoon Delayed swallow initiation-vallecula;Reduced epiglottic inversion Pharyngeal -- Pharyngeal- Thin Cup Reduced epiglottic inversion;Penetration/Aspiration during swallow Pharyngeal Material enters airway, remains ABOVE vocal cords then ejected out Pharyngeal- Thin Straw Reduced epiglottic inversion;Penetration/Aspiration during swallow Pharyngeal Material enters airway, remains ABOVE vocal cords then ejected out Pharyngeal- Puree WFL Pharyngeal -- Pharyngeal- Mechanical Soft -- Pharyngeal --  Pharyngeal- Regular Pharyngeal residue - valleculae;Reduced epiglottic inversion Pharyngeal -- Pharyngeal- Multi-consistency -- Pharyngeal -- Pharyngeal- Pill Pharyngeal residue - valleculae;Reduced epiglottic inversion Pharyngeal -- Pharyngeal Comment edematous pharynx  CHL IP CERVICAL ESOPHAGEAL PHASE 03/11/2020 Cervical Esophageal Phase Impaired Pudding Teaspoon -- Pudding Cup -- Honey Teaspoon -- Honey Cup -- Nectar Teaspoon -- Nectar Cup -- Nectar Straw -- Thin Teaspoon -- Thin Cup Prominent cricopharyngeal segment Thin Straw -- Puree -- Mechanical Soft -- Regular -- Multi-consistency -- Pill -- Cervical Esophageal Comment -- Thank you, [name], Hanna [place] 03/11/2020, 2:12 PM              ECHOCARDIOGRAM COMPLETE  Result Date: 03/11/2020    ECHOCARDIOGRAM REPORT   Patient Name:   Veronica Horton Date of Exam: 03/11/2020 Medical Rec #:  [id number]       Height:       65.0 in Accession #:    [id number]      Weight:       244.0 lb Date of Birth:  08/30/1960       BSA:          2.153 m Patient Age:    34 years        BP:           115/35 mmHg Patient Gender: F               HR:           57 bpm. Exam Location:  [facility] Procedure: 2D Echo Indications:    Dyspnea 786.09 / R06.00  History:        Patient has prior history of Echocardiogram examinations, most                 recent 01/16/2019. COPD; Risk Factors:Hypertension and Current                 Smoker. Acute respiratory failure with hypoxia.  Sonographer:    Leavy Cella RDCS (AE) Referring Phys: [id number] DAWOOD S ELGER[place]WY IMPRESSIONS  1. Left ventricular ejection fraction, by estimation, is 70 to 75%. The left ventricle has hyperdynamic function. The left ventricle has no regional wall motion abnormalities. There is mild left ventricular hypertrophy. Left ventricular diastolic parameters were normal.  2. Right ventricular systolic function is normal. The right ventricular size is normal. Tricuspid regurgitation signal is inadequate for assessing PA pressure.  3. The mitral valve is grossly normal. Trivial mitral valve regurgitation.  4. The aortic valve is tricuspid. Aortic valve regurgitation is not visualized.  5. The inferior vena cava is normal in size with greater than 50% respiratory variability, suggesting right atrial pressure of 3 mmHg. FINDINGS  Left Ventricle: Left ventricular ejection fraction, by estimation, is 70 to 75%. The left ventricle has hyperdynamic function. The left ventricle has no regional wall motion abnormalities. The left ventricular internal cavity size was normal in size. There is mild left ventricular hypertrophy. Left ventricular  diastolic parameters were normal. Right Ventricle: The right ventricular size is normal. No increase in right ventricular wall thickness. Right ventricular systolic function is normal. Tricuspid regurgitation signal is inadequate for assessing PA pressure. Left Atrium: Left atrial size was normal in size. Right Atrium: Right atrial size was normal in size. Pericardium: There is no evidence of pericardial effusion. Mitral Valve: The mitral valve is grossly normal. Trivial mitral valve regurgitation. Tricuspid Valve: The tricuspid valve is grossly normal. Tricuspid valve regurgitation is trivial. Aortic Valve: The aortic valve is tricuspid. Aortic  valve regurgitation is not visualized. Pulmonic Valve: The pulmonic valve was not well visualized. Pulmonic valve regurgitation is not visualized. Aorta: The aortic root is normal in size and structure. Venous: The inferior vena cava is normal in size with greater than 50% respiratory variability, suggesting right atrial pressure of 3 mmHg. IAS/Shunts: No atrial level shunt detected by color flow Doppler.  LEFT VENTRICLE PLAX 2D LVIDd:         4.05 cm  Diastology LVIDs:         2.30 cm  LV e' medial:    8.59 cm/s LV PW:         1.35 cm  LV E/e' medial:  12.2 LV IVS:        1.34 cm  LV e' lateral:   11.00 cm/s LVOT diam:     2.00 cm  LV E/e' lateral: 9.5 LVOT Area:     3.14 cm  RIGHT VENTRICLE RV S prime:     15.40 cm/s TAPSE (M-mode): 2.9 cm LEFT ATRIUM             Index       RIGHT ATRIUM           Index LA diam:        4.70 cm 2.18 cm/m  RA Area:     13.50 cm LA Vol (A2C):   73.3 ml 34.05 ml/m RA Volume:   35.40 ml  16.44 ml/m LA Vol (A4C):   39.3 ml 18.26 ml/m LA Biplane Vol: 55.6 ml 25.83 ml/m   AORTA Ao Root diam: 2.70 cm MITRAL VALVE MV Area (PHT): 2.69 cm     SHUNTS MV Decel Time: 282 msec     Systemic Diam: 2.00 cm MV E velocity: 105.00 cm/s MV A velocity: 57.80 cm/s MV E/A ratio:  1.82 Rozann Lesches MD Electronically signed by Rozann Lesches MD Signature  Date/Time: 03/11/2020/4:59:31 PM    Final       IMPRESSION/PLAN: Laryngeal cancer  This is a delightful patient with locally advancedsupraglottic cancer who has declined surgical options; airway protected by trach currently. Per consensus at ENT tumor board, I recommend 7 weeks of radiotherapy for this patient. She may be a candidate for concurrent chemotherapy.   Patient's sister [name]) was present on speaker phone for the discussion, as was Harlow Asa, RN (in person), our Head and Neck Oncology Navigator  We discussed the potential risks, benefits, and side effects of radiotherapy. We talked in detail about acute and late effects. We discussed that some of the most bothersome acute effects may be mucositis, dysgeusia, salivary changes, skin irritation, hair loss, dehydration, weight loss and fatigue. We talked about late effects which include but are not necessarily limited to dysphagia, lymphedema, hypothyroidism, nerve injury, vascular injury, spinal cord injury, xerostomia, trismus, neck edema, and potential injury to any of the tissues in the head and neck region. No guarantees of treatment were given. A consent form was signed and placed in the patient's medical record. The patient is enthusiastic about proceeding with treatment. I look forward to participating in the patient's care.    Simulation (treatment planning) will take place today  We also discussed that the treatment of head and neck cancer is a multidisciplinary process to maximize treatment outcomes and quality of life. For this reason the following referrals have been or will be made:   Medical oncology to discuss chemotherapy    Nutritionist for nutrition support during and after treatment.   Speech language pathology for  swallowing and/or speech therapy.   Social work for social support.    Physical therapy due to risk of lymphedema in neck and deconditioning.   Baseline labs including TSH.  Harlow Asa  will coordinate ancillary care once pt is discharged from hospital. Anticipate starting RT 03/29/20 to allow technical time for radiation planning w/ our physics team.  On date of service, in total, I spent 45 minutes on this encounter. Patient was seen in person.  __________________________________________   Eppie Gibson, MD  This document serves as a record of services personally performed by Eppie Gibson, MD. It was created on his behalf by Clerance Lav, a trained medical scribe. The creation of this record is based on the scribe's personal observations and the provider's statements to them. This document has been checked and approved by the attending provider.

## 2020-03-21 NOTE — Progress Notes (Signed)
NAME:  Veronica Horton, MRN:  [id number], DOB:  June 06, 1960, LOS: 22 ADMISSION DATE:  03/10/2020, CONSULTATION DATE:  03/21/20 REFERRING MD:  Hospitalist, CHIEF COMPLAINT:  Respiratory failure   Brief History   59 y.o. F with PMH of COPD 11/10 with shortness of breath found to have laryngeal mass concerning for malignancy s/p biopsy and tracheostomy.  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  She was transferred to the intensive care unit post biopsy as she required ventilator support.  Tx to [facility] on 11/17 for ONC therapy.   Past Medical History  COPD Bronchitis  Class III Obesity  DJD  Hiatal Hernia  HTN  Significant Hospital Events   11/10 admit to hospitalists with SOB 11/13 laryngoscopy and biopsy with tracheostomy 11/15 transferred to intensive care unit 11/15 transition to trach collar 11/17 Tx to [facility] for ONC therapy. 24 hours on ATC.   Consults:  ENT Oncology PCCM  Procedures:  ENT Trach 11/13 >>  Significant Diagnostic Tests:  11/11 CT soft tissue neck with contrast >> Bulky bilateral supraglottic tumor with possible airway Compromise, Involvement of right laryngeal cartilages, with extension in the midline anterior to the strap muscles, and also extension through the right paraglottic space and/or inseparable malignant right level IIIa lymph node. 11/15 CT chest/abdomen/pelvis >> no signs of metastatic disease, rounded partially necrotic lymph node to the left neck, basilar consolidative changes similar to prior, small pneumomediastinum  Micro Data:  COVID 11/10 >> negative Influenza 11/10 >> negative  MRSA PCR 11/10 >>  Sputum 11/14 >> serratia >> R-cefazolin, S-ceftriaxone  Antimicrobials:  Azithro 11/10 x1  Clinda 11/10 x1  Doxycycline 11/10 >> 11/14  Levofloxacin 11/14 >> 11/19  Interim history/subjective:  Slept well last night on TC. Having ongoing thick secretions.  Objective   Blood pressure (!) 113/52,  pulse (P) 68, temperature 98.7 F (37.1 C), temperature source Oral, resp. rate (P) 16, height 5\' 5"  (1.651 m), weight 112.5 kg, SpO2 93 %.    FiO2 (%):  [40 %-60 %] 60 %   Intake/Output Summary (Last 24 hours) at 03/21/2020 1612 Last data filed at 03/20/2020 2000 Gross per 24 hour  Intake 120 ml  Output 650 ml  Net -530 ml   Filed Weights   03/16/20 0500 03/17/20 0500 03/18/20 0500  Weight: 112.4 kg 112.5 kg 112.5 kg    Exam: General: chronically ill appearing woman sitting up in bed in NAD HEENT: [place]/AT, eyes anicteric, poor dentition Neck: trach in place, some dried blood around the site Neuro: awake and alert, nodding to answer questions an mouthing answers. Responding appropriately to questions. CV: S1S2, RRR PULM: faint rhonchi bilaterally, examined just after pneumatic vest therapy. Breathing comfortably on TC. Coughing up thick secretions. 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 She will not be able to resume dry  powder inhalers at home due to upper airway occlusion. Will need nebulizer trach collar attachment for all inhaled meds. Recommend yupelri and brovana at discharge if possible. Will also need trach collar attachment for home oxygen rather than [place]. Transtracheal oxygen may be an eventual  requirement if she is unable to wean to a suitable home oxygen setting on TC. -solumedrol 40 mg QD  -remain on TC unless needs to go back on MV- doing well off the vent since AM on 11/20 -aspiration precautions -No intubation from above due to airway occlusion. Not a PMV candidate unless she has appropriate collateral air flow- Con't SLP evaluation. Last evaluation on 11/16. Patient would receive total laryngectomy if she opted for surgical management of her malignancy.  GERD -PPI   Thrombocytopenia  -follow CBC  -monitor for bleeding    Best practice:  Diet: NPO, TF Pain/Anxiety/Delirium protocol (if indicated): Fentanyl prn VAP protocol (if indicated): HOB elevated   DVT prophylaxis: Lovenox GI prophylaxis: PPI Glucose control: SSI Mobility: as tolerated  Code Status: Full code Family Communication: Patient updated on plan of care 11/21 Disposition: per primary. PCCM will follow for trach assist.   Labs   CBC: Recent Labs  Lab 03/15/20 0105 03/15/20 0105 03/16/20 5732 03/17/20 0245 03/18/20 0909 03/19/20 0254 03/20/20 0249  WBC 8.2   < > 6.9 9.5 11.4* 13.7* 11.8*  NEUTROABS 7.3  --  5.5 7.4  --   --   --   HGB 14.6   < > 14.1 14.5 14.2 14.1 14.8  HCT 45.4   < > 44.8 46.2* 45.9 44.3 47.6*  MCV 98.3   < > 98.2 99.1 102.7* 98.9 101.3*  PLT 146*   < > 121* 112* 103* 122* 130*   < > = values in this interval not displayed.    Basic Metabolic Panel: Recent Labs  Lab 03/16/20 0613 03/17/20 0245 03/18/20 0909 03/19/20 0254 03/20/20 0249  NA 144 145 142 140 141  K 3.9 4.3 4.5 4.4 4.6  CL 105 104 105 106 104  CO2 30 32 32 30 27  GLUCOSE 126* 116* 123* 130* 86  BUN 35* 31* 28* 30* 31*  CREATININE 1.13* 1.10* 0.91 0.97 0.91  CALCIUM 8.8* 8.6* 8.5* 8.5* 8.4*  MG 2.6* 2.6* 2.8* 2.6* 2.7*  PHOS 3.2 3.1 3.5 3.8 4.5   GFR: Estimated Creatinine Clearance: 82.2 mL/min (by C-G formula based on SCr of 0.91 mg/dL). Recent Labs  Lab 03/17/20 0245 03/18/20 0909  03/19/20 0254 03/20/20 0249  WBC 9.5 11.4* 13.7* 11.8*    Liver Function Tests: No results for input(s): AST, ALT, ALKPHOS, BILITOT, PROT, ALBUMIN in the last 168 hours. No results for input(s): LIPASE, AMYLASE in the last 168 hours. No results for input(s): AMMONIA in the last 168 hours.  ABG    Component Value Date/Time   PHART 7.373 03/14/2020 0936   PCO2ART 55.4 (H) 03/14/2020 0936   PO2ART 108 03/14/2020 0936   HCO3 31.7 (H) 03/14/2020 0936   O2SAT 97.8 03/14/2020 Atkinson Dakoda Bassette, DO 03/21/20 4:31 PM [facility]

## 2020-03-21 NOTE — Progress Notes (Signed)
PROGRESS NOTE    Veronica Horton  ZOX:[id number] DOB: December 12, 1960 DOA: 03/10/2020 PCP: [facility]   Chief Complain: Shortness of breath  Brief Narrative: Patient is a 59 year old female with history of morbid obesity, hiatal hernia, hypertension, COPD, chronic respiratory failure on home oxygen at 5 L/min, smoker who presents to the emergency room  On 03/10/20 with complaint of shortness of breath, altered mental status.  On presentation ABG showed PCO2 of 92 and she had to be kept on BiPAP.  She was found to have laryngeal mass concerning for malignancy and she underwent biopsy which showed invasive squamous  cell carcinoma and she required tracheostomy.  She had to be transferred to ICU post biopsy and had to be kept on ventilator support.  She was transferred to [facility] on 11/17 for radiation therapy.  Plan is to put the PEG by GI on 03/22/2020.  Transfer to [facility] service from PCCM service on 03/20/2020.  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  Found to have airway obstruction/compromise due to laryngeal mass.  CT soft tissue neck on 11/11 showed bulky bilateral supraglottic tumor with possible airway compromise, involvement of right laryngeal cartilages.  ENT was consulted.  She underwent laryngeal biopsy which showed squamous cell carcinoma.  Status post tracheostomy Undergoing PEG placement by IR on 11/22. CT chest/abdomen/pelvis did not show signs of metastatic disease. She was  recommended total laryngectomy with bilateral neck dissection but she refused surgery.  Pan is to start concurrent chemoradiation.Oncology following  Acute on chronic hypoxic/hypercarbic respiratory failure: History of COPD, on oxygen at home at 5 L/min.  Chronic respiratory failure exacerbated by laryngeal squamous cell carcinoma, COPD exacerbation, pneumonia.  Hospital course was remarkable for Serratia pneumonia.  Finished the course of antibiotics with levofloxacin.   COPD exacerbation: Being treated with steroids, bronchodilators.  Continue pulmonary hygiene, ventilator support at night, trach collar during the day.  Follow intermittent chest x-ray.  Aspiration precautions.  GERD: Continue PPI  Hypertension: Monitor blood pressure.  Off antihypertensives which she are at home.  AKI: Avoid nephrotoxins.  Continue to monitor BMP.  Thrombocytopenia: Stable.    Nutrition Problem: Increased nutrient needs Etiology: cancer and cancer related treatments, chronic illness      DVT prophylaxis:Lovenox Code Status: Full code Family Communication: None at bedside Status is: Inpatient  Remains inpatient appropriate because:Inpatient level of care appropriate due to severity of illness   Dispo: The patient is from: Home              Anticipated d/c is to: Home              Anticipated d/c date is: > 3 days              Patient currently is not medically stable to d/c.    Consultants: PCCM, ENT, oncology  Procedures: Tracheostomy  Antimicrobials:  Anti-infectives (From admission, onward)   Start     Dose/Rate Route Frequency Ordered Stop   03/14/20 1100  levofloxacin (LEVAQUIN)  IVPB 750 mg        750 mg 100 mL/hr over 90 Minutes Intravenous Daily 03/14/20 0955 03/19/20 1146   03/10/20 1730  doxycycline (VIBRAMYCIN) 100 mg in sodium chloride 0.9 % 250 mL IVPB  Status:  Discontinued        100 mg 125 mL/hr over 120 Minutes Intravenous Every 12 hours 03/10/20 1634 03/14/20 0955    03/10/20 1600  azithromycin (ZITHROMAX) 500 mg in sodium chloride 0.9 % 250 mL IVPB  Status:  Discontinued        500 mg 250 mL/hr over 60 Minutes Intravenous Every 24 hours 03/10/20 1529 03/10/20 1538   03/10/20 1500  clindamycin (CLEOCIN) IVPB 600 mg  Status:  Discontinued        600 mg 100 mL/hr over 30 Minutes Intravenous  Once 03/10/20 1458 03/10/20 1521      Subjective:  Patient seen and examined at the bedside this morning.  Hemodynamically stable.  Sitting on the chair.  She had some desdaturarion issue this mrng  but during my evaluation she was on trach collar and comfortable.  Plan for PEG placement tomorrow by IR.  Objective: Vitals:   03/21/20 0400 03/21/20 0500 03/21/20 0502 03/21/20 0600  BP: (!) 110/46 124/61  (!) 105/45  Pulse: 63 69  68  Resp: $Remo'16 18  15  'JmEbA$ Temp: 98.4 F (36.9 C)     TempSrc: Oral     SpO2: 90% 92% 92% 90%  Weight:      Height:        Intake/Output Summary (Last 24 hours) at 03/21/2020 0736 Last data filed at 03/20/2020 2000 Gross per 24 hour  Intake 1263.5 ml  Output 1050 ml  Net 213.5 ml   Filed Weights   03/16/20 0500 03/17/20 0500 03/18/20 0500  Weight: 112.4 kg 112.5 kg 112.5 kg    Examination:  General exam: Comfortable, sitting in the chair HEENT: Trach collar, NG tube Respiratory system: Bilateral equal air entry, normal vesicular breath sounds, no wheezes or crackles  Cardiovascular system: S1 & S2 heard, RRR. No JVD, murmurs, rubs, gallops or clicks. Gastrointestinal system: Abdomen is nondistended, soft and nontender. No organomegaly or masses felt. Normal bowel sounds heard. Central nervous system: Alert and oriented. No focal neurological deficits. Extremities: No edema, no clubbing ,no cyanosis Skin: No rashes, lesions or ulcers,no icterus ,no pallor    Data Reviewed: I have personally reviewed following labs and imaging studies  CBC: Recent Labs  Lab 03/15/20 0105 03/15/20 0105 03/16/20 6440 03/17/20 0245  03/18/20 0909 03/19/20 0254 03/20/20 0249  WBC 8.2   < > 6.9 9.5 11.4* 13.7* 11.8*  NEUTROABS 7.3  --  5.5 7.4  --   --   --   HGB 14.6   < > 14.1 14.5 14.2 14.1 14.8  HCT 45.4   < > 44.8 46.2* 45.9 44.3 47.6*  MCV 98.3   < > 98.2 99.1 102.7* 98.9 101.3*  PLT 146*   < > 121* 112* 103* 122* 130*   < > = values in this interval not displayed.   Basic Metabolic Panel: Recent Labs  Lab 03/16/20 0613 03/17/20 0245 03/18/20 0909 03/19/20 0254 03/20/20 0249  NA 144 145 142 140 141  K 3.9 4.3 4.5 4.4 4.6  CL 105 104 105 106 104  CO2 30 32 32 30 27  GLUCOSE 126* 116* 123* 130* 86  BUN 35* 31* 28* 30* 31*  CREATININE 1.13* 1.10* 0.91 0.97 0.91  CALCIUM 8.8* 8.6*  8.5* 8.5* 8.4*  MG 2.6* 2.6* 2.8* 2.6* 2.7*  PHOS 3.2 3.1 3.5 3.8 4.5   GFR: Estimated Creatinine Clearance: 82.2 mL/min (by C-G formula based on SCr of 0.91 mg/dL). Liver Function Tests: No results for input(s): AST, ALT, ALKPHOS, BILITOT, PROT, ALBUMIN in the last 168 hours. No results for input(s): LIPASE, AMYLASE in the last 168 hours. No results for input(s): AMMONIA in the last 168 hours. Coagulation Profile: Recent Labs  Lab 03/19/20 0254  INR 1.1   Cardiac Enzymes: No results for input(s): CKTOTAL, CKMB, CKMBINDEX, TROPONINI in the last 168 hours. BNP (last 3 results) No results for input(s): PROBNP in the last 8760 hours. HbA1C: No results for input(s): HGBA1C in the last 72 hours. CBG: Recent Labs  Lab 03/20/20 1130 03/20/20 1604 03/20/20 2000 03/21/20 0001 03/21/20 0353  GLUCAP 152* 143* 102* 109* 122*   Lipid Profile: No results for input(s): CHOL, HDL, LDLCALC, TRIG, CHOLHDL, LDLDIRECT in the last 72 hours. Thyroid Function Tests: No results for input(s): TSH, T4TOTAL, FREET4, T3FREE, THYROIDAB in the last 72 hours. Anemia Panel: No results for input(s): VITAMINB12, FOLATE, FERRITIN, TIBC, IRON, RETICCTPCT in the last 72 hours. Sepsis Labs: No results for input(s): PROCALCITON, LATICACIDVEN  in the last 168 hours.  Recent Results (from the past 240 hour(s))  Culture, respiratory     Status: None   Collection Time: 03/14/20 11:36 AM   Specimen: Tracheal Aspirate  Result Value Ref Range Status   Specimen Description TRACHEAL ASPIRATE  Final   Special Requests NONE  Final   Gram Stain   Final    NO WBC SEEN NO ORGANISMS SEEN Performed at [facility], [address], [place], [place] [address]    Culture RARE SERRATIA MARCESCENS  Final   Report Status 03/16/2020 FINAL  Final   Organism ID, Bacteria SERRATIA MARCESCENS  Final      Susceptibility   Serratia marcescens - MIC*    CEFAZOLIN >=64 RESISTANT Resistant     CEFEPIME <=0.12 SENSITIVE Sensitive     CEFTAZIDIME <=1 SENSITIVE Sensitive     CEFTRIAXONE <=0.25 SENSITIVE Sensitive     CIPROFLOXACIN <=0.25 SENSITIVE Sensitive     GENTAMICIN <=1 SENSITIVE Sensitive     TRIMETH/SULFA <=20 SENSITIVE Sensitive     * RARE SERRATIA MARCESCENS         Radiology Studies: No results found.      Scheduled Meds: . arformoterol  15 mcg Nebulization BID  . budesonide (PULMICORT) nebulizer solution  0.5 mg Nebulization BID  . chlorhexidine gluconate (MEDLINE KIT)  15 mL Mouth Rinse BID  . Chlorhexidine Gluconate Cloth  6 each Topical Daily  . docusate  100 mg Per Tube BID  . feeding supplement (PROSource TF)  45 mL Per Tube BID  . insulin aspart  0-9 Units Subcutaneous Q4H  . ipratropium-albuterol  3 mL Nebulization Q4H  . mouth rinse  15 mL Mouth Rinse 10 times per day  . methylPREDNISolone (SOLU-MEDROL) injection  30 mg Intravenous Daily  . multivitamin with minerals  1 tablet Per Tube Daily  . nicotine  21 mg Transdermal Daily  . pantoprazole (PROTONIX) IV  40 mg Intravenous Q24H   Continuous Infusions: . sodium chloride    . feeding supplement (OSMOLITE 1.5 CAL) 1,000 mL (03/19/20 0507)     LOS: 11 days    Time spent:35 mins. More than 50% of that time was spent in counseling and/or coordination of  care.      Shelly Coss,  MD [facility] P11/21/2021, 7:36 AM

## 2020-03-21 NOTE — Progress Notes (Signed)
Patient stayed on [facility] all night and has done very well. Had to increase oxygen to 60% due to sats being 86%. 2000 CPT was not done due to patient sitting up in the chair.

## 2020-03-22 ENCOUNTER — Inpatient Hospital Stay (HOSPITAL_COMMUNITY): Payer: Medicaid Other

## 2020-03-22 ENCOUNTER — Encounter: Payer: Self-pay | Admitting: Radiation Oncology

## 2020-03-22 DIAGNOSIS — J449 Chronic obstructive pulmonary disease, unspecified: Secondary | ICD-10-CM

## 2020-03-22 HISTORY — PX: IR GASTROSTOMY TUBE MOD SED: IMG625

## 2020-03-22 LAB — GLUCOSE, CAPILLARY
Glucose-Capillary: 95 mg/dL (ref 70–99)
Glucose-Capillary: 91 mg/dL (ref 70–99)
Glucose-Capillary: 101 mg/dL — ABNORMAL HIGH (ref 70–99)
Glucose-Capillary: 89 mg/dL (ref 70–99)
Glucose-Capillary: 66 mg/dL — ABNORMAL LOW (ref 70–99)
Glucose-Capillary: 88 mg/dL (ref 70–99)

## 2020-03-22 LAB — TSH: TSH: 9.685 u[IU]/mL — ABNORMAL HIGH (ref 0.350–4.500)

## 2020-03-22 MED ORDER — IPRATROPIUM-ALBUTEROL 0.5-2.5 (3) MG/3ML IN SOLN
3.0000 mL | Freq: Four times a day (QID) | RESPIRATORY_TRACT | Status: DC
  Administered 2020-03-22 (×2): 3 mL via RESPIRATORY_TRACT
  Filled 2020-03-22 (×3): qty 3

## 2020-03-22 MED ORDER — IOHEXOL 300 MG/ML  SOLN
50.0000 mL | Freq: Once | INTRAMUSCULAR | Status: AC | PRN
  Administered 2020-03-22: 12 mL

## 2020-03-22 MED ORDER — VANCOMYCIN HCL IN DEXTROSE 1-5 GM/200ML-% IV SOLN
INTRAVENOUS | Status: AC
  Administered 2020-03-22: 1500 mg
  Filled 2020-03-22: qty 200

## 2020-03-22 MED ORDER — GLUCAGON HCL (RDNA) 1 MG IJ SOLR
INTRAMUSCULAR | Status: DC | PRN
  Administered 2020-03-22: 1 mg via INTRAVENOUS
  Administered 2020-03-22: .5 mg via INTRAVENOUS
  Administered 2020-04-09: 1 mg via INTRAVENOUS

## 2020-03-22 MED ORDER — GLUCAGON HCL RDNA (DIAGNOSTIC) 1 MG IJ SOLR
INTRAMUSCULAR | Status: AC
  Filled 2020-03-22: qty 1

## 2020-03-22 MED ORDER — STERILE WATER FOR INJECTION IJ SOLN
INTRAMUSCULAR | Status: AC
  Filled 2020-03-22: qty 10

## 2020-03-22 MED ORDER — FENTANYL CITRATE (PF) 100 MCG/2ML IJ SOLN
INTRAMUSCULAR | Status: AC
  Filled 2020-03-22: qty 2

## 2020-03-22 MED ORDER — FENTANYL CITRATE (PF) 100 MCG/2ML IJ SOLN
INTRAMUSCULAR | Status: DC | PRN
Start: 2020-03-22 — End: 2020-03-24
  Administered 2020-03-22 (×2): 25 ug via INTRAVENOUS

## 2020-03-22 MED ORDER — LIDOCAINE HCL (PF) 1 % IJ SOLN
INTRAMUSCULAR | Status: DC | PRN
  Administered 2020-03-22: 10 mL via INTRADERMAL

## 2020-03-22 MED ORDER — MIDAZOLAM HCL 2 MG/2ML IJ SOLN
INTRAMUSCULAR | Status: AC
  Filled 2020-03-22: qty 2

## 2020-03-22 MED ORDER — DEXTROSE 50 % IV SOLN
INTRAVENOUS | Status: AC
  Filled 2020-03-22: qty 50

## 2020-03-22 MED ORDER — VANCOMYCIN HCL IN DEXTROSE 1-5 GM/200ML-% IV SOLN: 1000.0000 mg | Freq: Once | INTRAVENOUS | Status: DC

## 2020-03-22 MED ORDER — LIDOCAINE HCL 1 % IJ SOLN
INTRAMUSCULAR | Status: AC
  Filled 2020-03-22: qty 20

## 2020-03-22 NOTE — Progress Notes (Signed)
Holding per patient pain. Will administer CPT at later time.

## 2020-03-22 NOTE — Progress Notes (Signed)
PROGRESS NOTE    Veronica Horton  QMV:[id number] DOB: Jan 20, 1961 DOA: 03/10/2020 PCP: [facility]   Chief Complain: Shortness of breath  Brief Narrative: Patient is a 59 year old female with history of morbid obesity, hiatal hernia, hypertension, COPD, chronic respiratory failure on home oxygen at 5 L/min, smoker who presents to the emergency room  On 03/10/20 with complaint of shortness of breath, altered mental status.  On presentation ABG showed PCO2 of 92 and she had to be kept on BiPAP.  She was found to have laryngeal mass concerning for malignancy and she underwent biopsy which showed invasive squamous  cell carcinoma and she required tracheostomy.  She had to be transferred to ICU post biopsy and had to be kept on ventilator support.  She was transferred to [facility] on 11/17 for radiation therapy.  Plan is to put the PEG by GI on 03/22/2020.  Transfer to [facility] service from PCCM service on 03/20/2020.  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  Found to have airway obstruction/compromise due to laryngeal mass.  CT soft tissue neck on 11/11 showed bulky bilateral supraglottic tumor with possible airway compromise, involvement of right laryngeal cartilages.  ENT was consulted.  She underwent laryngeal biopsy which showed squamous cell carcinoma.  Status post tracheostomy Undergoing PEG placement by IR on 11/22. CT chest/abdomen/pelvis did not show signs of metastatic disease. She was  recommended total laryngectomy with bilateral neck dissection but she refused surgery.  Pan is to start concurrent chemoradiation.Oncology following  Acute on chronic hypoxic/hypercarbic respiratory failure: History of COPD, on oxygen at home at 5 L/min.  Chronic respiratory failure exacerbated by laryngeal squamous cell carcinoma, COPD exacerbation, pneumonia.  Hospital course was remarkable for Serratia pneumonia.  Finished the course of antibiotics with levofloxacin.   COPD exacerbation: Being treated with steroids, bronchodilators.  Continue pulmonary hygiene, ventilator support at night, trach collar during the day.  Follow intermittent chest x-ray.  Aspiration precautions.  GERD: Continue PPI  Hypertension: Monitor blood pressure.  Off antihypertensives which she was at home.  Blood pressure okay.  AKI: Resolved  Thrombocytopenia: Stable.    Nutrition Problem: Increased nutrient needs Etiology: cancer and cancer related treatments, chronic illness      DVT prophylaxis:Lovenox Code Status: Full code Family Communication: None at bedside Status is: Inpatient  Remains inpatient appropriate because:Inpatient level of care appropriate due to severity of illness   Dispo: The patient is from: Home              Anticipated d/c is to: Home              Anticipated d/c date is: > 3 days              Patient currently is not medically stable to d/c.    Consultants: PCCM, ENT, oncology  Procedures: Tracheostomy  Antimicrobials:  Anti-infectives (From admission, onward)   Start     Dose/Rate Route Frequency Ordered Stop   03/14/20 1100  levofloxacin (LEVAQUIN) IVPB 750  mg        750 mg 100 mL/hr over 90 Minutes Intravenous Daily 03/14/20 0955 03/19/20 1146   03/10/20 1730  doxycycline (VIBRAMYCIN) 100 mg in sodium chloride 0.9 % 250 mL IVPB  Status:  Discontinued        100 mg 125 mL/hr over 120 Minutes Intravenous Every 12 hours 03/10/20 1634 03/14/20 0955   03/10/20 1600   azithromycin (ZITHROMAX) 500 mg in sodium chloride 0.9 % 250 mL IVPB  Status:  Discontinued        500 mg 250 mL/hr over 60 Minutes Intravenous Every 24 hours 03/10/20 1529 03/10/20 1538   03/10/20 1500  clindamycin (CLEOCIN) IVPB 600 mg  Status:  Discontinued        600 mg 100 mL/hr over 30 Minutes Intravenous  Once 03/10/20 1458 03/10/20 1521      Subjective:  Patient seen and examined at the bedside this morning.  Comfortable, hemodynamically stable.  No active issues today.  Plan for IR guided PEG placement  Objective: Vitals:   03/22/20 0200 03/22/20 0400 03/22/20 0459 03/22/20 0600  BP: (!) 104/49 (!) 107/52  (!) 118/55  Pulse: 70 64 (!) 58 (!) 57  Resp: _0 Temp:   98.7 F (37.1 C)   TempSrc:   Axillary   SpO2: 90% 93% 94% 96%  Weight:      Height:        Intake/Output Summary (Last 24 hours) at 03/22/2020 0744 Last data filed at 03/22/2020 0000 Gross per 24 hour  Intake 640 ml  Output 950 ml  Net -310 ml   Filed Weights   03/16/20 0500 03/17/20 0500 03/18/20 0500  Weight: 112.4 kg 112.5 kg 112.5 kg    Examination: General exam:comfortable, morbidly obese HEENT:Trach collar Respiratory system: Bilateral equal air entry, normal vesicular breath sounds, no wheezes or crackles  Cardiovascular system: S1 & S2 heard, RRR. No JVD, murmurs, rubs, gallops or clicks. Gastrointestinal system: Abdomen is nondistended, soft and nontender. No organomegaly or masses felt. Normal bowel sounds heard. Central nervous system: Alert and oriented. No focal neurological deficits. Extremities: No edema, no clubbing ,no cyanosis Skin: No  ulcers,no icterus ,no pallor, chronic venous stasis changes in bilateral lower extremities   Data Reviewed: I have personally reviewed following labs and imaging studies  CBC: Recent Labs  Lab 03/16/20 0613 03/17/20 0245 03/18/20 0909 03/19/20 0254 03/20/20 0249  WBC 6.9 9.5 11.4* 13.7* 11.8*  NEUTROABS 5.5 7.4  --   --   --    HGB 14.1 14.5 14.2 14.1 14.8  HCT 44.8 46.2* 45.9 44.3 47.6*  MCV 98.2 99.1 102.7* 98.9 101.3*  PLT 121* 112* 103* 122* 419*   Basic Metabolic Panel: Recent Labs  Lab 03/16/20 0613 03/17/20 0245 03/18/20 0909 03/19/20 0254 03/20/20 0249  NA 144 145 142 140 141  K 3.9 4.3 4.5 4.4 4.6  CL 105 104 105 106 104  CO2 30 32 32 30 27  GLUCOSE 126* 116* 123* 130* 86  BUN 35* 31* 28* 30* 31*  CREATININE 1.13* 1.10* 0.91 0.97 0.91  CALCIUM 8.8* 8.6* 8.5* 8.5* 8.4*  MG 2.6* 2.6* 2.8* 2.6* 2.7*  PHOS 3.2 3.1 3.5 3.8 4.5   GFR: Estimated Creatinine Clearance: 82.2 mL/min (by C-G formula based on SCr of 0.91 mg/dL). Liver Function Tests: No results for input(s): AST, ALT, ALKPHOS, BILITOT, PROT, ALBUMIN in the last 168 hours. No results for input(s): LIPASE, AMYLASE in the last 168 hours. No results  for input(s): AMMONIA in the last 168 hours. Coagulation Profile: Recent Labs  Lab 03/19/20 0254  INR 1.1   Cardiac Enzymes: No results for input(s): CKTOTAL, CKMB, CKMBINDEX, TROPONINI in the last 168 hours. BNP (last 3 results) No results for input(s): PROBNP in the last 8760 hours. HbA1C: No results for input(s): HGBA1C in the last 72 hours. CBG: Recent Labs  Lab 03/21/20 1245 03/21/20 1557 03/21/20 1957 03/21/20 2328 03/22/20 0333  GLUCAP 118* 156* 124* 93 95   Lipid Profile: No results for input(s): CHOL, HDL, LDLCALC, TRIG, CHOLHDL, LDLDIRECT in the last 72 hours. Thyroid Function Tests: No results for input(s): TSH, T4TOTAL, FREET4, T3FREE, THYROIDAB in the last 72 hours. Anemia Panel: No results for input(s): VITAMINB12, FOLATE, FERRITIN, TIBC, IRON, RETICCTPCT in the last 72 hours. Sepsis Labs: No results for input(s): PROCALCITON, LATICACIDVEN in the last 168 hours.  Recent Results (from the past 240 hour(s))  Culture, respiratory     Status: None   Collection Time: 03/14/20 11:36 AM   Specimen: Tracheal Aspirate  Result Value Ref Range Status   Specimen  Description TRACHEAL ASPIRATE  Final   Special Requests NONE  Final   Gram Stain   Final    NO WBC SEEN NO ORGANISMS SEEN Performed at [facility], [address] [place], [place] [address]    Culture RARE SERRATIA MARCESCENS  Final   Report Status 03/16/2020 FINAL  Final   Organism ID, Bacteria SERRATIA MARCESCENS  Final      Susceptibility   Serratia marcescens - MIC*    CEFAZOLIN >=64 RESISTANT Resistant     CEFEPIME <=0.12 SENSITIVE Sensitive     CEFTAZIDIME <=1 SENSITIVE Sensitive     CEFTRIAXONE <=0.25 SENSITIVE Sensitive     CIPROFLOXACIN <=0.25 SENSITIVE Sensitive     GENTAMICIN <=1 SENSITIVE Sensitive     TRIMETH/SULFA <=20 SENSITIVE Sensitive     * RARE SERRATIA MARCESCENS  Culture, respiratory (non-expectorated)     Status: None (Preliminary result)   Collection Time: 03/21/20  4:25 PM   Specimen: Tracheal Aspirate; Respiratory  Result Value Ref Range Status   Specimen Description   Final    TRACHEAL ASPIRATE Performed at [facility] [address] [place], [place] [address]    Special Requests   Final    NONE Performed at [facility], [place] [address], [place], [place] [address]    Gram Stain   Final    FEW WBC PRESENT, PREDOMINANTLY PMN FEW GRAM POSITIVE COCCI IN PAIRS IN CLUSTERS RARE GRAM NEGATIVE RODS Performed at [facility], [place] [address], [place], [place] [address]    Culture PENDING  Incomplete   Report Status PENDING  Incomplete         Radiology Studies: No results found.      Scheduled Meds: . arformoterol  15 mcg Nebulization BID  . budesonide (PULMICORT) nebulizer solution  0.5 mg Nebulization BID  . chlorhexidine gluconate (MEDLINE KIT)  15 mL Mouth Rinse BID  . Chlorhexidine Gluconate Cloth  6 each Topical Daily  . docusate  100 mg Per Tube BID  . feeding supplement (PROSource TF)  45 mL Per Tube BID  . insulin aspart  0-9 Units Subcutaneous Q4H  . ipratropium-albuterol  3  mL Nebulization Q6H  . mouth rinse  15 mL Mouth Rinse 10 times per day  . methylPREDNISolone (SOLU-MEDROL) injection  30 mg Intravenous Daily  . multivitamin with minerals  1 tablet Per Tube Daily  .  nicotine  21 mg Transdermal Daily  . pantoprazole (PROTONIX) IV  40 mg Intravenous Q24H   Continuous Infusions: . sodium chloride    . feeding supplement (OSMOLITE 1.5 CAL) 60 mL/hr at 03/22/20 0000     LOS: 12 days    Time spent:35 mins. More than 50% of that time was spent in counseling and/or coordination of care.      Shelly Coss, MD [facility] P11/22/2021, 7:44 AM

## 2020-03-22 NOTE — Procedures (Signed)
Interventional Radiology Procedure Note  Procedure: fluoro 16 fr balloon tip Gtube    Complications: None  Estimated Blood Loss:  min  Findings: Confirmed in the stomach    Veronica Punt, MD

## 2020-03-22 NOTE — Progress Notes (Signed)
Patient declined CPT at this time.

## 2020-03-22 NOTE — Progress Notes (Signed)
Oncology Nurse Navigator Documentation  Met with patient during initial consult with Ms. Herrod.  . Introduced myself as his/their Navigator, explained my role as a member of the Care Team. . Assisted with post-consult appt scheduling. . She verbalized understanding of information provided. . She is currently an inpatient. Will need to arrange transportation when patient is discharged.  . I encouraged them to call with questions/concerns moving forward.  Harlow Asa, RN, BSN, OCN Head & Neck Oncology Nurse [place] [facility] [phone]

## 2020-03-22 NOTE — Progress Notes (Signed)
Pt complaining of pain at this time.  Cpt held per patient's request.

## 2020-03-22 NOTE — Progress Notes (Signed)
Physical Therapy Treatment Patient Details Name: Veronica Horton MRN: [id number] DOB: [birth year] Today's Date: 03/22/2020    History of Present Illness Ms. Childress is a 59 year old female with medical history significant for COPD with chronic hypoxic respiratory failure on 5 L, HTN, tobacco use, obesity class 3 who presented on 11/10 with worsening shortness of breath over the past 4 days and increased confusion. Pt found to be in COPD exacerbation, hospital course at [facility] complicated by dysphasia, pt found to have a laryngeal mass. Pt transferred to [facility]. Pt underwent trach placement on 11/13 s/p layrngeal mass biopsy, went on vent 11/14 in AM due to desaturation..    PT Comments    Patient found asleep in bed. Beginning/supine vitals 108/47 BP, 64 HR, 92% O2 sat on 10L 60%. Min A to get EOB for line management, increased time, and assist with trunk negotiation; notable LE weakness. Sitting vitals 115/52 BP, 65 HR, 87% O2 sat. Pt experiencing no SOB. Min G with sit<>stand for safety; pt requires increased time to get standing upright, but no knee buckling observed or physical assistance needed. Standing vitals 109/60 BP, 75 HR, 77% O2 sats. Gait is deferred due to drop in vitals. Min A to get supine, assistance needed to get centered in bed. Ending supine vials 105/48 BP, 63 HR, 92% O2 sats. Pt claiming to be asymptomatic throughout session, no c/o dizziness or SOB. Nursing notified of desatting.   Follow Up Recommendations  Home health PT;Supervision/Assistance - 24 hour     Equipment Recommendations  None recommended by PT    Recommendations for Other Services       Precautions / Restrictions Precautions Precautions: Fall Precaution Comments: Vent with trach, cortrak Restrictions Weight Bearing Restrictions: No    Mobility  Bed Mobility Overal bed mobility: Needs Assistance Bed Mobility: Supine to Sit;Sit to Supine     Supine to sit: Min assist Sit to supine: Min assist    General bed mobility comments: HOB up, increased time, assist to initiate LEs over EOB and to raise trunk, pt uses momentum to get her LEs back into bed  Transfers Overall transfer level: Needs assistance Equipment used: Rolling walker (2 wheeled) Transfers: Sit to/from Stand Sit to Stand: Min guard;+2 safety/equipment Stand pivot transfers: Min guard       General transfer comment: min guard for safety and line management  Ambulation/Gait             General Gait Details: No gait performed due to BP and O2 sat drop in standing   Stairs             Wheelchair Mobility    Modified Rankin (Stroke Patients Only)       Balance                                            Cognition Arousal/Alertness: Awake/alert Behavior During Therapy: WFL for tasks assessed/performed Overall Cognitive Status: Within Functional Limits for tasks assessed                                        Exercises      General Comments        Pertinent Vitals/Pain Pain Assessment: No/denies pain Pain Intervention(s): Monitored during session    Home Living  Prior Function            PT Goals (current goals can now be found in the care plan section) Acute Rehab PT Goals Patient Stated Goal: to eat PT Goal Formulation: With patient Time For Goal Achievement: 03/28/20 Potential to Achieve Goals: Good Progress towards PT goals: Progressing toward goals    Frequency    Min 3X/week      PT Plan Current plan remains appropriate    Co-evaluation              AM-PAC PT "6 Clicks" Mobility   Outcome Measure  Help needed turning from your back to your side while in a flat bed without using bedrails?: None Help needed moving from lying on your back to sitting on the side of a flat bed without using bedrails?: None Help needed moving to and from a bed to a chair (including a wheelchair)?: A Little Help  needed standing up from a chair using your arms (e.g., wheelchair or bedside chair)?: A Little Help needed to walk in hospital room?: A Little Help needed climbing 3-5 steps with a railing? : A Lot 6 Click Score: 19    End of Session Equipment Utilized During Treatment: Gait belt Activity Tolerance: Patient tolerated treatment well Patient left: in bed;with bed alarm set;with call bell/phone within reach Nurse Communication: Mobility status PT Visit Diagnosis: Unsteadiness on feet (R26.81);Other abnormalities of gait and mobility (R26.89);Muscle weakness (generalized) (M62.81)     Time: 4496-7591 PT Time Calculation (min) (ACUTE ONLY): 23 min  Charges:  $Therapeutic Activity: 23-37 mins                     C. Parks [place], [place] [phone]

## 2020-03-22 NOTE — TOC Progression Note (Addendum)
Transition of Care [facility]) - Progression Note    Patient Details  Name: Veronica Horton MRN: [id number] Date of Birth: 1961-04-13  Transition of Care [facility]) CM/SW Contact  Leeroy Cha, RN Phone Number: 03/22/2020, 8:42 AM  Clinical Narrative:    tracheostomy on 11132021/Iv solu medrol, tube feeds with osmolite Trach collar at 60% Plan is to return to home Following for progression. Trach supplies for home use ordered through [employer] [place], text sent to [place] at 1228 Expected Discharge Plan: [place] Barriers to Discharge: Continued Medical Work up  Expected Discharge Plan and Services Expected Discharge Plan: [place]                                               Social Determinants of Health (SDOH) Interventions    Readmission Risk Interventions Readmission Risk Prevention Plan 01/20/2019  Medication Screening Complete  Transportation Screening Complete  Some recent data might be hidden

## 2020-03-22 NOTE — Progress Notes (Signed)
Oncology Nurse Navigator Documentation  At Dr. Pearlie Oyster request I called and spoke to Veronica Horton, Veronica Horton. I requested a TSH be added to her next blood draw. She verbalized that she would get the order and place it for Veronica Horton.  Harlow Asa RN, BSN, OCN Head & Neck Oncology Nurse [place] at [facility] Phone # [phone]  Fax # [phone]

## 2020-03-22 NOTE — Progress Notes (Addendum)
NAME:  Veronica Horton, MRN:  [id number], DOB:  1960-06-24, LOS: 12 ADMISSION DATE:  03/10/2020, CONSULTATION DATE:  03/22/20 REFERRING MD:  Hospitalist, CHIEF COMPLAINT:  Respiratory failure   Brief History   59 y.o. F with PMH of COPD 11/10 with shortness of breath found to have laryngeal mass concerning for malignancy s/p biopsy and tracheostomy.  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  She was transferred to the intensive care unit post biopsy as she required ventilator support.  Tx to [facility] on 11/17 for ONC therapy.   Past Medical History  COPD, Bronchitis,Class III Obesity ,DJD,Hiatal [facility] Events   11/10 admit to hospitalists with SOB 11/13 laryngoscopy and biopsy with tracheostomy 11/15 transferred to intensive care unit 11/15 transition to trach collar 11/17 Tx to [facility] for ONC therapy. 24 hours on ATC.   Consults:  ENT Oncology PCCM  Procedures:  ENT Trach 11/13 >>  Significant Diagnostic Tests:  11/11 CT soft tissue neck with contrast >> Bulky bilateral supraglottic tumor with possible airway Compromise, Involvement of right laryngeal cartilages, with extension in the midline anterior to the strap muscles, and also extension through the right paraglottic space and/or inseparable malignant right level IIIa lymph node. 11/15 CT chest/abdomen/pelvis >> no signs of metastatic disease, rounded partially necrotic lymph node to the left neck, basilar consolidative changes similar to prior, small pneumomediastinum  Micro Data:  COVID 11/10 >> negative Influenza 11/10 >> negative  MRSA PCR 11/10 >>  Sputum 11/14 >> serratia >> R-cefazolin, S-ceftriaxone  Antimicrobials:  Azithro 11/10 x1  Clinda 11/10 x1  Doxycycline 11/10 >> 11/14  Levofloxacin 11/14 >> 11/19  Interim history/subjective:  No acute issues   Objective   Blood pressure (Abnormal) 118/55, pulse (Abnormal) 57, temperature 98 F (36.7  C), temperature source Oral, resp. rate 18, height 5\' 5"  (1.651 m), weight 112.5 kg, SpO2 (Abnormal) 87 %.    FiO2 (%):  [60 %] 60 %   Intake/Output Summary (Last 24 hours) at 03/22/2020 1020 Last data filed at 03/22/2020 0000 Gross per 24 hour  Intake 640 ml  Output 950 ml  Net -310 ml   Filed Weights   03/16/20 0500 03/17/20 0500 03/18/20 0500  Weight: 112.4 kg 112.5 kg 112.5 kg    Exam:  General this is a 59 year old white female resting in bed no distress HENT NCAT # 6 cuffed trach is unremarkable. No breakdown. Does have thick tracheal secretions Pulm scattered rhonchi  No accessory use. 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 No intubation from above due to airway occlusion. Plan Cont BID pulmicort and duoneb Wean oxygen  Recommend Yupelri and brovana at dc (DO NOT SEND HOME ON DRY POWDER NEBS) Cont slow pred taper Aspiration precautions Not PMV candidate   GERD Plan PPI  Thrombocytopenia  Plan  Trend cbc   Best practice:  Diet: NPO, TF Pain/Anxiety/Delirium protocol (if indicated): Fentanyl prn VAP protocol (if indicated): HOB elevated   DVT prophylaxis: Lovenox GI prophylaxis: PPI Glucose control: SSI Mobility: as tolerated  Code Status: Full code Family Communication: Patient updated on plan of care  11/21 Disposition: per primary. PCCM will follow for trach assist.   Erick Colace ACNP-BC [place] Pager # [phone] OR # [phone] if no answer

## 2020-03-22 NOTE — Consult Note (Signed)
Please see note at top of chart for consultation. -----------------------------------  Eppie Gibson, MD

## 2020-03-22 NOTE — Progress Notes (Signed)
Nutrition Follow-up  DOCUMENTATION CODES:   Morbid obesity  INTERVENTION:   Once PEG ready to use: -Initiate Osmolite 1.5 @ 40 ml/hr, advance by 10 ml every 4 hours to goal rate of 60 ml/hr.  Goal for discharge: -Once tolerating continuous feeds at goal can transition to bolus feeds of 237 ml Osmolite 1.5 -6 times daily -Free water flush with 60 ml before and after each bolus feed -Provides 2130 kcals, 89g protein and 1806 ml H2O  NUTRITION DIAGNOSIS:   Increased nutrient needs related to cancer and cancer related treatments, chronic illness as evidenced by estimated needs.  GOAL:   Patient will meet greater than or equal to 90% of their needs  MONITOR:   TF tolerance, Diet advancement, Labs, Weight trends  ASSESSMENT:   59 year old female with history significant of hiatal hernia, HTN, bronchitis, COPD, and chronic respiratory failure on 5 L oxygen at night, presents with AMS and worsening dyspnea over the last 4 days.  11/11 MBS by SLP with abnormal larynx, CT neck with supraglottic mass 11/13 Direct laryngoscopy with biopsy and trach 11/14 intubated 11/15 CT chest/abdomen pelvis with no signs of metastatic disease, partially necrotic lymph node to left neck, Cortrak placed, transitioned to trach collar 11/17 transferred to [facility] for chemoradiation  Patient currently on trach collar, last required MV on 11/20. TF currently off for placement of PEG today in IR.   Patient has been consistently receiving TF since Cortrak placement on 11/15. Will initiate feeds continuously via PEG but anticipate will be able to transition to bolus feeds for discharge soon after. Will leave recommendations for both.  Admission weight: 244 lbs. Current weight: 248 lbs  Medications: Colace, Multivitamin with minerals daily, Magic Mouthwash with lidocaine  Labs reviewed: CBGs: 91-101  Diet Order:   Diet Order            Diet NPO time specified  Diet effective midnight                  EDUCATION NEEDS:   Not appropriate for education at this time  Skin:  Skin Assessment: Skin Integrity Issues: Skin Integrity Issues:: Other (Comment) Other: Cellulits; LLE; MASD; R thigh  Last BM:  11/13  Height:   Ht Readings from Last 1 Encounters:  03/14/20 5\' 5"  (1.651 m)    Weight:   Wt Readings from Last 1 Encounters:  03/18/20 112.5 kg   BMI:  Body mass index is 41.27 kg/m.  Estimated Nutritional Needs:   Kcal:  2000-2300 kcals  Protein:  110-125  Fluid:  > 2 L/day  Clayton Bibles, MS, RD, LDN Inpatient Clinical Dietitian Contact information available via Amion

## 2020-03-23 ENCOUNTER — Inpatient Hospital Stay (HOSPITAL_COMMUNITY): Payer: Medicaid Other

## 2020-03-23 LAB — CBC WITH DIFFERENTIAL/PLATELET
Abs Immature Granulocytes: 0.11 10*3/uL — ABNORMAL HIGH (ref 0.00–0.07)
Basophils Absolute: 0 10*3/uL (ref 0.0–0.1)
Basophils Relative: 0 %
Eosinophils Absolute: 0.1 10*3/uL (ref 0.0–0.5)
Eosinophils Relative: 1 %
HCT: 44.2 % (ref 36.0–46.0)
Hemoglobin: 13.9 g/dL (ref 12.0–15.0)
Immature Granulocytes: 1 %
Lymphocytes Relative: 12 %
Lymphs Abs: 1.6 10*3/uL (ref 0.7–4.0)
MCH: 31.4 pg (ref 26.0–34.0)
MCHC: 31.4 g/dL (ref 30.0–36.0)
MCV: 100 fL (ref 80.0–100.0)
Monocytes Absolute: 1 10*3/uL (ref 0.1–1.0)
Monocytes Relative: 7 %
Neutro Abs: 10.6 10*3/uL — ABNORMAL HIGH (ref 1.7–7.7)
Neutrophils Relative %: 79 %
Platelets: 163 10*3/uL (ref 150–400)
RBC: 4.42 MIL/uL (ref 3.87–5.11)
RDW: 15 % (ref 11.5–15.5)
WBC: 13.4 10*3/uL — ABNORMAL HIGH (ref 4.0–10.5)
nRBC: 0 % (ref 0.0–0.2)

## 2020-03-23 LAB — GLUCOSE, CAPILLARY
Glucose-Capillary: 72 mg/dL (ref 70–99)
Glucose-Capillary: 77 mg/dL (ref 70–99)
Glucose-Capillary: 81 mg/dL (ref 70–99)
Glucose-Capillary: 121 mg/dL — ABNORMAL HIGH (ref 70–99)
Glucose-Capillary: 111 mg/dL — ABNORMAL HIGH (ref 70–99)
Glucose-Capillary: 104 mg/dL — ABNORMAL HIGH (ref 70–99)

## 2020-03-23 LAB — BASIC METABOLIC PANEL
Anion gap: 9 (ref 5–15)
BUN: 29 mg/dL — ABNORMAL HIGH (ref 6–20)
CO2: 23 mmol/L (ref 22–32)
Calcium: 8.4 mg/dL — ABNORMAL LOW (ref 8.9–10.3)
Chloride: 107 mmol/L (ref 98–111)
Creatinine, Ser: 0.81 mg/dL (ref 0.44–1.00)
GFR, Estimated: >60 mL/min (ref >60)
Glucose, Bld: 81 mg/dL (ref 70–99)
Potassium: 4.4 mmol/L (ref 3.5–5.1)
Sodium: 139 mmol/L (ref 135–145)

## 2020-03-23 LAB — CULTURE, RESPIRATORY W GRAM STAIN: Culture: NORMAL

## 2020-03-23 MED ORDER — IPRATROPIUM-ALBUTEROL 0.5-2.5 (3) MG/3ML IN SOLN
3.0000 mL | Freq: Three times a day (TID) | RESPIRATORY_TRACT | Status: DC
  Administered 2020-03-23 – 2020-04-07 (×44): 3 mL via RESPIRATORY_TRACT
  Filled 2020-03-23 (×44): qty 3

## 2020-03-23 MED ORDER — ENOXAPARIN SODIUM 60 MG/0.6ML ~~LOC~~ SOLN
55.0000 mg | SUBCUTANEOUS | Status: DC
  Administered 2020-03-23 – 2020-04-01 (×10): 55 mg via SUBCUTANEOUS
  Filled 2020-03-23: qty 0.6
  Filled 2020-03-23: qty 0.55
  Filled 2020-03-23 (×5): qty 0.6
  Filled 2020-03-23 (×2): qty 0.55
  Filled 2020-03-23 (×2): qty 0.6

## 2020-03-23 MED ORDER — DEXTROSE 10 % IV SOLN: INTRAVENOUS | Status: DC

## 2020-03-23 MED ORDER — PREDNISONE 20 MG PO TABS
20.0000 mg | ORAL_TABLET | Freq: Every day | ORAL | Status: DC
  Administered 2020-03-23 – 2020-03-24 (×2): 20 mg via ORAL
  Filled 2020-03-23 (×2): qty 1

## 2020-03-23 MED ORDER — OSMOLITE 1.5 CAL PO LIQD
1000.0000 mL | ORAL | Status: DC
  Administered 2020-03-23 – 2020-03-29 (×7): 1000 mL
  Filled 2020-03-23 (×10): qty 1000

## 2020-03-23 NOTE — TOC Progression Note (Signed)
Transition of Care [facility]) - Progression Note    Patient Details  Name: Veronica Horton MRN: [id number] Date of Birth: 12-Mar-1961  Transition of Care [facility]) CM/SW Contact  Leeroy Cha, RN Phone Number: 03/23/2020, 1:57 PM  Clinical Narrative:    Damaris Schooner with [name] the patient sister.  Wanting to know about how the [place] application ws coming.  Letter emailed to [employer] the fin. Counselor for this group. To pleas call the sister name and number to call was given.   Expected Discharge Plan: Skilled Nursing Facility Barriers to Discharge: Continued Medical Work up  Expected Discharge Plan and Services Expected Discharge Plan: [place] Acute Care Choice: Durable Medical Equipment                   DME Arranged: Lurline Idol supplies DME Agency: [employer] Date DME Agency Contacted: 03/22/20 Time DME Agency Contacted: 1229 Representative spoke with at DME Agency: [email or web address]             Social Determinants of Health ([place]) Interventions    Readmission Risk Interventions Readmission Risk Prevention Plan 01/20/2019  Medication Screening Complete  Transportation Screening Complete  Some recent data might be hidden

## 2020-03-23 NOTE — Progress Notes (Signed)
  Speech Language Pathology Treatment: Dysphagia  Patient Details Name: Veronica Horton MRN: [id number] DOB: 1960-07-03 Today's Date: 03/23/2020 Time: 4944-9675 SLP Time Calculation (min) (ACUTE ONLY): 30 min  Assessment / Plan / Recommendation Clinical Impression  Pt today sitting upright in chair with sister present.  Per documentation, she will not be a candidate for a PMSV presumed due to mass location.  Pt is now s/p #6 Shiley trach placed - currently with cuff deflated.  She is also s/p PEG placement 03/22/2020 with nutrition started via feeding tube 03/23/2020.  SLP had undergone MBS at [facility] with recommendations for Dys3/thin diet -  Due to her anatomical related dysphagia resulting in flash penetration of thin, minimal vallecular residuals with brief stasis of barium tablet at vallecular space. It was recommended pt take pills with puree - follow with liquids.    Given pt's placement of trach and premorbid COPD, pill dysphagia and pt requiring ventilator usage - she would benefit from repeat MBS to assess changes in swallow function and airway protection.    Pt's articulation is excellent, thus SLP had her brush her oral cavity and consume single ice chips. Delayed cough noted.  Pt with oral and trachea viscous secretion clearance  - however pt's trach was also manipulated (as it was off center and SLP obtained RN for assist) which likely contributed to cough.     Encouraged pt to continue po of ice chips after oral care.  Advised her to conduct 2 swallows with each ice chip bolus and conduct effortful swallow.  Pt able to demonstrate effortful swallows with min cues and viewed oral care importance with teach back.  Pt's sister and pt agreeable to plan and swallow precaution signs provided and posted.       HPI HPI: Kimberle Stanfill is a 59 y/o F with history of COPD,chronic respiratory failure on home oxygen at 5 L/min, with large laryngeal mass lesion, now s/p tracheostomy with Shiley  6-0 and direct laryngoscopy and biopsy. 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  Pt is also s/p PEG for nutrition.  Plans are for chemo and radiation concurrently starting while in hospital.  Prior medical history includes smoking and some dyspnea if walking long distanced.      SLP Plan  Continue with current plan of care       Recommendations  Diet recommendations: Other(comment) (ice) Medication Administration: Via alternative means Supervision: Patient able to self feed                Oral Care Recommendations: Oral care prior to ice chip/H20 Follow up Recommendations: Other (comment) (TBD) SLP Visit Diagnosis: Dysphagia, pharyngoesophageal phase (R13.14) Plan: Continue with current plan of care       GO                [name] 03/23/2020, 2:23 PM [name], MS [facility] SLP Acute Rehab Services Office [phone] Pager [phone]

## 2020-03-23 NOTE — Progress Notes (Signed)
PROGRESS NOTE    Veronica Horton  OJJ:[id number] DOB: Apr 18, 1961 DOA: 03/10/2020 PCP: [facility]   Chief Complain: Shortness of breath  Brief Narrative: Patient is a 59 year old female with history of morbid obesity, hiatal hernia, hypertension, COPD, chronic respiratory failure on home oxygen at 5 L/min, smoker who presents to the emergency room  On 03/10/20 with complaint of shortness of breath, altered mental status.  On presentation ABG showed PCO2 of 92 and she had to be kept on BiPAP.  She was found to have laryngeal mass concerning for malignancy and she underwent biopsy which showed invasive squamous  cell carcinoma and she required tracheostomy.  She had to be transferred to ICU post biopsy and had to be kept on ventilator support.  She was transferred to [facility] on 11/17 for radiation therapy.  ransfer to [facility] service from [facility] service on 03/20/2020. Underwent  PEG placement  by IR on 03/22/2020. Plan is to start concurrent chemo radiation during this hospitalization while seeking placement in CIR.  Oncology/radiation oncology following.  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  Found to have airway obstruction/compromise due to laryngeal mass.  CT soft tissue neck on 11/11 showed bulky bilateral supraglottic tumor with possible airway compromise, involvement of right laryngeal cartilages.  ENT was consulted.  She underwent laryngeal biopsy which showed squamous cell carcinoma.   Status post tracheostomy Underwent PEG placement by IR on 11/22. CT chest/abdomen/pelvis did not show signs of metastatic disease. She was recommended total laryngectomy with bilateral neck dissection but she refused surgery.  Pan is to start concurrent chemoradiation during this hospitalization.Oncology/radiation oncology following.  Acute on chronic hypoxic/hypercarbic respiratory failure: History of COPD, on oxygen at home at 5 L/min.  Chronic respiratory failure exacerbated by laryngeal squamous cell carcinoma, COPD exacerbation, pneumonia.  Hospital course was remarkable for Serratia pneumonia.  Finished the course of antibiotics with levofloxacin.  Currently on trach collar on day, vent at night.  Chest x-ray done today showed persistent  bibasilar atelectasis/infiltrates and a small bilateral pleural effusions.  PCCM following.  Continue bronchodilators.   COPD exacerbation: Being treated with steroids, bronchodilators.  Continue pulmonary hygiene, ventilator support at night, trach collar during the day.  Follow intermittent chest x-ray.  Aspiration precautions.  Continue tapering steroids.  Hypoglycemia: Hypoglycemia overnight.  Given D10 infusion.  We will start her tube feeding so hopefully this will resolve the issue.  GERD: Continue PPI  Hypertension: Monitor blood pressure.  Off antihypertensives which she was at home.  Blood pressure okay.  AKI: Resolved  Thrombocytopenia: Stable.  Disposition: PT/OT recommending CIR.  She denies skilled nursing facility placement.  Plan is to start on concurrent chemoradiation during this hospitalization but can go to the CIR whenever possible.  TOC following    Nutrition Problem: Increased nutrient needs Etiology: cancer and cancer related treatments, chronic illness      DVT prophylaxis:Lovenox Code Status: Full code Family Communication: Called  sister for update,call not received Status is: Inpatient  Remains  inpatient  appropriate because:Inpatient level of care appropriate due to severity of illness   Dispo: The patient is from: Home              Anticipated d/c is to: CIR              Anticipated d/c date is: > 3 days              Patient currently is not medically stable to d/c.    Consultants: PCCM, ENT, oncology  Procedures: Tracheostomy  Antimicrobials:  Anti-infectives (From admission, onward)   Start     Dose/Rate Route Frequency Ordered Stop   03/22/20 1700  vancomycin (VANCOCIN) IVPB 1000 mg/200 mL premix  Status:  Discontinued        1,000 mg 200 mL/hr over 60 Minutes Intravenous  Once 03/22/20 1605 03/23/20 0236   03/22/20 1604  vancomycin (VANCOCIN) 1-5 GM/200ML-% IVPB       Note to Pharmacy: [name]   : cabinet override      03/22/20 1604 03/22/20 1615   03/14/20 1100  levofloxacin (LEVAQUIN) IVPB 750 mg        750 mg 100 mL/hr over 90 Minutes Intravenous Daily 03/14/20 0955 03/19/20 1146   03/10/20 1730  doxycycline (VIBRAMYCIN) 100 mg in sodium chloride 0.9 % 250 mL IVPB  Status:  Discontinued        100 mg 125 mL/hr over 120 Minutes Intravenous Every 12 hours 03/10/20 1634 03/14/20 0955   03/10/20 1600  azithromycin (ZITHROMAX) 500 mg in sodium chloride 0.9 % 250 mL IVPB  Status:  Discontinued        500 mg 250 mL/hr over 60 Minutes Intravenous Every 24 hours 03/10/20 1529 03/10/20 1538   03/10/20 1500  clindamycin (CLEOCIN) IVPB 600 mg  Status:  Discontinued        600 mg 100 mL/hr over 30 Minutes Intravenous  Once 03/10/20 1458 03/10/20 1521      Subjective:  Patient seen and examined at the bedside this morning.  Sitting on the edge of the bed.  Overall comfortable but had some desaturation this morning and blood sugar issues last night.  Objective: Vitals:   03/23/20 0329 03/23/20 0400 03/23/20 0500 03/23/20 0600  BP: (!) 104/44 (!) 120/56 112/63 (!) 102/45  Pulse: (!) 58 (!) 56 (!) 57 (!) 56  Resp: $Remo'17 20 15 16  'TNZyY$ Temp: 98.4 F (36.9 C)     TempSrc: Oral      SpO2: 95% 95% 95% 93%  Weight:   111.8 kg   Height:        Intake/Output Summary (Last 24 hours) at 03/23/2020 0754 Last data filed at 03/23/2020 0500 Gross per 24 hour  Intake 75.58 ml  Output 600 ml  Net -524.42 ml   Filed Weights   03/17/20 0500 03/18/20 0500 03/23/20 0500  Weight: 112.5 kg 112.5 kg 111.8 kg    Examination:  General exam: Comfortable, morbidly obese  HEENT: Trach Respiratory system: Bilateral rhonchi  cardiovascular system: S1 & S2 heard, RRR. No JVD, murmurs, rubs, gallops or clicks. Gastrointestinal system: Abdomen is nondistended, soft and nontender. No organomegaly or masses felt. Normal bowel sounds heard.  PEG Central nervous system: Alert and oriented. No focal neurological deficits. Extremities: No edema, no clubbing ,no cyanosis Skin: Chronic venous stasis changes in bilateral lower extremities,  no lesions or ulcers,no icterus ,no pallor    Data Reviewed: I have personally reviewed following labs and imaging studies  CBC: Recent Labs  Lab 03/17/20 0245 03/18/20 0909 03/19/20 0254 03/20/20 0249 03/23/20 0301  WBC 9.5 11.4* 13.7* 11.8* 13.4*  NEUTROABS 7.4  --   --   --  10.6*  HGB 14.5 14.2 14.1 14.8 13.9  HCT 46.2* 45.9 44.3 47.6* 44.2  MCV 99.1 102.7* 98.9 101.3* 100.0  PLT 112* 103* 122* 130* 096   Basic Metabolic Panel: Recent Labs  Lab 03/17/20 0245 03/18/20 0909 03/19/20 0254 03/20/20 0249 03/23/20 0301  NA 145 142 140 141 139  K 4.3 4.5 4.4 4.6 4.4  CL 104 105 106 104 107  CO2 32 32 $Rem'30 27 23  'bUjT$ GLUCOSE 116* 123* 130* 86 81  BUN 31* 28* 30* 31* 29*  CREATININE 1.10* 0.91 0.97 0.91 0.81  CALCIUM 8.6* 8.5* 8.5* 8.4* 8.4*  MG 2.6* 2.8* 2.6* 2.7*  --   PHOS 3.1 3.5 3.8 4.5  --    GFR: Estimated Creatinine Clearance: 92 mL/min (by C-G formula based on SCr of 0.81 mg/dL). Liver Function Tests: No results for input(s): AST, ALT, ALKPHOS, BILITOT, PROT, ALBUMIN in the last 168 hours. No results for input(s): LIPASE,  AMYLASE in the last 168 hours. No results for input(s): AMMONIA in the last 168 hours. Coagulation Profile: Recent Labs  Lab 03/19/20 0254  INR 1.1   Cardiac Enzymes: No results for input(s): CKTOTAL, CKMB, CKMBINDEX, TROPONINI in the last 168 hours. BNP (last 3 results) No results for input(s): PROBNP in the last 8760 hours. HbA1C: No results for input(s): HGBA1C in the last 72 hours. CBG: Recent Labs  Lab 03/22/20 1825 03/22/20 2011 03/22/20 2101 03/23/20 0103 03/23/20 0339  GLUCAP 89 66* 88 72 77   Lipid Profile: No results for input(s): CHOL, HDL, LDLCALC, TRIG, CHOLHDL, LDLDIRECT in the last 72 hours. Thyroid Function Tests: Recent Labs    03/22/20 1038  TSH 9.685*   Anemia Panel: No results for input(s): VITAMINB12, FOLATE, FERRITIN, TIBC, IRON, RETICCTPCT in the last 72 hours. Sepsis Labs: No results for input(s): PROCALCITON, LATICACIDVEN in the last 168 hours.  Recent Results (from the past 240 hour(s))  Culture, respiratory     Status: None   Collection Time: 03/14/20 11:36 AM   Specimen: Tracheal Aspirate  Result Value Ref Range Status   Specimen Description TRACHEAL ASPIRATE  Final   Special Requests NONE  Final   Gram Stain   Final    NO WBC SEEN NO ORGANISMS SEEN Performed at [facility], [address], [place], [place] [address]    Culture RARE SERRATIA MARCESCENS  Final   Report Status 03/16/2020 FINAL  Final   Organism ID, Bacteria SERRATIA MARCESCENS  Final      Susceptibility   Serratia marcescens - MIC*    CEFAZOLIN >=64 RESISTANT Resistant     CEFEPIME <=0.12 SENSITIVE Sensitive     CEFTAZIDIME <=1 SENSITIVE Sensitive     CEFTRIAXONE <=0.25 SENSITIVE Sensitive     CIPROFLOXACIN <=0.25 SENSITIVE Sensitive     GENTAMICIN <=1 SENSITIVE Sensitive     TRIMETH/SULFA <=20 SENSITIVE Sensitive     * RARE SERRATIA MARCESCENS  Culture, respiratory (non-expectorated)     Status: None   Collection Time: 03/21/20  4:25 PM   Specimen:  Tracheal Aspirate; Respiratory  Result Value Ref Range Status   Specimen Description   Final    TRACHEAL ASPIRATE Performed at [facility] [address], [place], [place] [address]    Special Requests   Final    NONE Performed at [facility], [place] [address] [place], [place] [address]    Gram Stain   Final    FEW WBC PRESENT, PREDOMINANTLY PMN FEW GRAM POSITIVE COCCI IN PAIRS IN CLUSTERS RARE GRAM NEGATIVE RODS    Culture   Final    Normal respiratory flora-no Staph aureus or Pseudomonas seen Performed at [facility], [address] [place], [place] [address]    Report Status 03/23/2020 FINAL  Final         Radiology Studies: IR GASTROSTOMY TUBE MOD SED  Result Date: 03/22/2020 INDICATION: Head and neck malignancy, dysphagia EXAM: FLUOROSCOPIC 16 FRENCH BALLOON RETENTION GASTROSTOMY MEDICATIONS: 1 G VANCOMYCIN; Antibiotics were administered within 1 hour of the procedure. GLUCAGON 0.5 MG IV ANESTHESIA/SEDATION: Versed 0 mg IV; Fentanyl 50 mcg IV Moderate Sedation Time:  12 MINUTES The patient was continuously monitored during the procedure by the interventional radiology nurse under my direct supervision. CONTRAST:  10 CC-administered into the gastric lumen. FLUOROSCOPY TIME:  Fluoroscopy Time: 1 minutes 36 seconds (53 mGy). COMPLICATIONS: None immediate. PROCEDURE: Informed written consent was obtained from the patient after a thorough discussion of the procedural risks, benefits and alternatives. All questions were addressed. Maximal Sterile Barrier Technique was utilized including caps, mask, sterile gowns, sterile gloves, sterile drape, hand hygiene and skin antiseptic. A timeout was performed prior to the initiation of the procedure. Previous imaging reviewed. Patient has an existing feeding tube within the stomach. This was utilized to insufflate the stomach with air. The stomach was localized beneath the left subcostal margin in  the left abdomen with biplane fluoroscopy. Overlying skin marked. Under sterile conditions and local anesthesia, introducer needle was advanced into the stomach. Contrast injection confirms percutaneous needle access within the stomach. Single T tack deployed for gastropexy. Amplatz guidewire inserted. Tract dilatation performed to insert the 16 French balloon retention gastrostomy. Balloon tip inflated with 7 cc mixture of saline and 1 cc contrast. This was retracted against the anterior gastric wall. Contrast injection confirms position in the stomach. Images obtained for documentation. T tacks secured to the skin site. A sterile dressing applied. No immediate complication. Patient tolerated the procedure well. IMPRESSION: Successful fluoroscopic 63 French balloon retention gastrostomy Electronically Signed   By: Jerilynn Mages.  Shick M.D.   On: 03/22/2020 16:53   DG CHEST PORT 1 VIEW  Result Date: 03/23/2020 CLINICAL DATA:  Hypoxia. EXAM: PORTABLE CHEST 1 VIEW COMPARISON:  Left 19 2021. FINDINGS: Interim removal of feeding tube. Tracheostomy tube in stable position. Cardiomegaly. Persistent bibasilar atelectasis/infiltrates and small bilateral pleural effusions. Density noted over the right mid chest may represent atelectasis and or pleural fluid pseudotumor, no interim change. IMPRESSION: 1. Interim removal of feeding tube. Tracheostomy tube in stable position. 2. Stable cardiomegaly. 3. Persistent bibasilar atelectasis/infiltrates and small bilateral pleural effusions. Density of the right mid chest may represent atelectasis and or pleural fluid pseudotumor, no interim change. Electronically Signed   By: Marcello Moores  Register   On: 03/23/2020 06:39        Scheduled Meds: . arformoterol  15 mcg Nebulization BID  . budesonide (PULMICORT) nebulizer solution  0.5 mg Nebulization BID  . chlorhexidine gluconate (MEDLINE KIT)  15 mL Mouth Rinse BID  . Chlorhexidine Gluconate Cloth  6 each Topical Daily  . docusate   100 mg Per Tube BID  . feeding supplement (PROSource TF)  45 mL Per Tube BID  . insulin aspart  0-9 Units Subcutaneous Q4H  . ipratropium-albuterol  3 mL Nebulization TID  . mouth rinse  15 mL Mouth Rinse 10 times per day  . methylPREDNISolone (SOLU-MEDROL) injection  30 mg Intravenous Daily  . multivitamin with minerals  1 tablet Per Tube Daily  . nicotine  21 mg Transdermal Daily  . pantoprazole (PROTONIX) IV  40 mg Intravenous Q24H   Continuous Infusions: . sodium chloride    . dextrose 30 mL/hr at 03/23/20 0500  . feeding supplement (OSMOLITE 1.5 CAL) Stopped (03/22/20 0740)     LOS: 13 days    Time spent:35 mins. More than 50% of that time was spent in counseling and/or coordination of care.      Shelly Coss, MD [facility] P11/23/2021, 7:54 AM

## 2020-03-23 NOTE — Progress Notes (Signed)
Pt declined cpt at this time.

## 2020-03-23 NOTE — Progress Notes (Signed)
Inpatient Rehab Admissions Coordinator Note:   Per OT recommendations, pt was screened for CIR candidacy by Shann Medal, PT, DPT.  Note PT recommending [place] f/u.  Pt currently requiring 10L via 60% trach collar and mobilizing at min assist or better.  We cannot accept patients requiring more than 28% FiO2 on trach collar, or O2 10L at rest.  Will not request a consult order at this time.  I will follow from a distance for decreasing O2 needs and if pt does not continue to progress with therapy, will rescreen for possible candidacy for CIR.   Please contact me with questions.   Shann Medal, PT, DPT [phone] 03/23/20 2:03 PM

## 2020-03-23 NOTE — Progress Notes (Signed)
Physical Therapy Treatment Patient Details Name: RAIZEL WESOLOWSKI MRN: [id number] DOB: 1960/11/14 Today's Date: 03/23/2020    History of Present Illness Ms. Espaillat is a 59 year old female with medical history significant for COPD with chronic hypoxic respiratory failure on 5 L, HTN, tobacco use, obesity class 3 who presented on 11/10 with worsening shortness of breath over the past 4 days and increased confusion. Pt found to be in COPD exacerbation, hospital course at [facility] complicated by dysphasia, pt found to have a laryngeal mass. Pt transferred to [facility]. Pt underwent trach placement on 11/13 s/p layrngeal mass biopsy, went on vent 11/14 in AM due to desaturation..11/22 G-tube    PT Comments    Patient received awake in chair with nurse present. Pt eager to participate in therapy. She is on 10L O2 60% T-collar. Beginning vitals 113/61 BP, 95 O2, 69 HR. Min G to stand for safety, additional time needed. No c/o of increased pain or SOB. O2 at 96 in standing. Min G in hallway for 243ft no LOB and 1 sitting rest break required for ~1 min. Slight SOB noted and lowest O2 sat to report at 92 during ambulation. Narrow base of support and decreased stride length, but overall steady and careful. She is educated on standing tall to open diaphragm and increase lung capacity. No overt c/o SOB and no s/s of dizziness. Min G for standing pivot to chair. Post session BP: 110/62 and O2sat: 100.  Follow Up Recommendations  Home health PT;Supervision/Assistance - 24 hour     Equipment Recommendations  None recommended by PT    Recommendations for Other Services       Precautions / Restrictions Precautions Precautions: Fall Precaution Comments: Vent with trach, G-tube Restrictions Weight Bearing Restrictions: No    Mobility  Bed Mobility               General bed mobility comments: Pt OOB in chair  Transfers Overall transfer level: Needs assistance Equipment used: Rolling walker (2  wheeled) Transfers: Sit to/from [facility] Sit to Stand: Min guard Stand pivot transfers: Min guard       General transfer comment: Min G for safety; pt assist with line management  Ambulation/Gait Ambulation/Gait assistance: Min guard Gait Distance (Feet): 225 Feet Assistive device: Rolling walker (2 wheeled) Gait Pattern/deviations: Decreased stride length;Trunk flexed;Step-through pattern Gait velocity: decreased   General Gait Details: cues to stand tall to open up lungs   Stairs             Wheelchair Mobility    Modified Rankin (Stroke Patients Only)       Balance                                            Cognition Arousal/Alertness: Awake/alert Behavior During Therapy: WFL for tasks assessed/performed Overall Cognitive Status: Within Functional Limits for tasks assessed                                        Exercises      General Comments        Pertinent Vitals/Pain Pain Assessment: Faces Pain Score: 4  Faces Pain Scale: Hurts a little bit Pain Location: G-tube insertion site Pain Descriptors / Indicators: Grimacing;Guarding Pain Intervention(s): Monitored during session  Home Living                      Prior Function            PT Goals (current goals can now be found in the care plan section) Acute Rehab PT Goals Patient Stated Goal: to go home PT Goal Formulation: With patient Time For Goal Achievement: 03/28/20 Potential to Achieve Goals: Good Progress towards PT goals: Progressing toward goals    Frequency    Min 3X/week      PT Plan Current plan remains appropriate    Co-evaluation              AM-PAC PT "6 Clicks" Mobility   Outcome Measure  Help needed turning from your back to your side while in a flat bed without using bedrails?: None Help needed moving from lying on your back to sitting on the side of a flat bed without using bedrails?:  None Help needed moving to and from a bed to a chair (including a wheelchair)?: A Little Help needed standing up from a chair using your arms (e.g., wheelchair or bedside chair)?: A Little Help needed to walk in hospital room?: A Little Help needed climbing 3-5 steps with a railing? : A Little 6 Click Score: 20    End of Session Equipment Utilized During Treatment: Gait belt;Oxygen Activity Tolerance: Patient tolerated treatment well Patient left: in chair;with call bell/phone within reach Nurse Communication: Mobility status PT Visit Diagnosis: Unsteadiness on feet (R26.81);Other abnormalities of gait and mobility (R26.89);Muscle weakness (generalized) (M62.81)     Time: 1540-1610 PT Time Calculation (min) (ACUTE ONLY): 30 min  Charges:  $Gait Training: 8-22 mins $Therapeutic Activity: 8-22 mins                     C. Parks [place], [facility] [phone]

## 2020-03-23 NOTE — Progress Notes (Signed)
  Speech Language Pathology Treatment: Cognitive-Linquistic (speech/communication)  Patient Details Name: Veronica Horton MRN: [id number] DOB: [birth year] Today's Date: 03/23/2020 Time: 1340-1401 SLP Time Calculation (min) (ACUTE ONLY): 21 min  Assessment / Plan / Recommendation Clinical Impression  Second session focused on communication alternatives for pt as per notes, she is not a PMSV candidate.  Pt currently is writing in a spiral notebook for communication.  Her writing is both legible and comprehensive thus is effective.  Provided pt with communication board for emergent use, writing frequent communication items and marking in a book certain areas including *basic needs, questions for MD, basic social communication, etc to keep pt from having to repeatedly write the same information.  In addition, showed pt 2 different AAC programs for smart phones or tablet that may be useful for her if she or her sister chose to obtain smart phone.  Pt's sister states she will look into getting the patient a smart device to allow AAC given uncertainty for amount of time pt may not be able to voice/PMSV contraindicated with mass.  Pt also demonstrated appropriate gestures for rapid communication  - eg: inability to speak. Pt and her sister advised to understanding to plan.   Will follow up with patient to facilitate expressive communication given she is cognitively intact.      HPI HPI: Veronica Horton is a 59 y/o F with history of COPD,chronic respiratory failure on home oxygen at 5 L/min, with large laryngeal mass lesion, now s/p tracheostomy with Shiley 6-0 and direct laryngoscopy and biopsy. 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  Pt is also s/p PEG for nutrition.  Plans are for chemo and radiation concurrently starting while in hospital.  Prior medical history includes smoking and some dyspnea if walking long distanced.      SLP Plan  Continue with current plan of care        Recommendations  Diet recommendations: Other(comment) (ice) Medication Administration: Via alternative means Supervision: Patient able to self feed                Oral Care Recommendations: Oral care prior to ice chip/H20 Follow up Recommendations: Other (comment) (TBD) SLP Visit Diagnosis: Aphonia (R49.1) Plan: Continue with current plan of care       GO                [name] 03/23/2020, 3:01 PM   [name], MS [facility] SLP Acute Rehab Services Office [phone] Pager [phone]

## 2020-03-23 NOTE — Progress Notes (Signed)
S/p perc G-tube yesterday Site clean dry. Already in use with TF, no leak.  Call IR if issues.  Ascencion Dike PA-C Interventional Radiology 03/23/2020 10:17 AM

## 2020-03-23 NOTE — Progress Notes (Signed)
NAME:  Veronica Horton, MRN:  [id number], DOB:  April 23, 1961, LOS: 32 ADMISSION DATE:  03/10/2020, CONSULTATION DATE:  03/23/20 REFERRING MD:  Hospitalist, CHIEF COMPLAINT:  Respiratory failure   Brief History   59 y.o. F with PMH of COPD 11/10 with shortness of breath found to have laryngeal mass concerning for malignancy s/p biopsy and tracheostomy.  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  She was transferred to the intensive care unit post biopsy as she required ventilator support.  Tx to [facility] on 11/17 for ONC therapy.   Past Medical History  COPD, Bronchitis,Class III Obesity ,DJD,[facility] Hospital Events   11/10 admit to hospitalists with SOB 11/13 laryngoscopy and biopsy with tracheostomy 11/15 transferred to intensive care unit 11/15 transition to trach collar 11/17 Tx to [facility] for ONC therapy. 24 hours on ATC.  11/22 G Tube with IR  11/23 starting EN   Consults:  ENT Oncology PCCM  Procedures:  ENT Trach 11/13 > IR GTube 11/22>   Significant Diagnostic Tests:  11/11 CT soft tissue neck with contrast >> Bulky bilateral supraglottic tumor with possible airway Compromise, Involvement of right laryngeal cartilages, with extension in the midline anterior to the strap muscles, and also extension through the right paraglottic space and/or inseparable malignant right level IIIa lymph node. 11/15 CT chest/abdomen/pelvis >> no signs of metastatic disease, rounded partially necrotic lymph node to the left neck, basilar consolidative changes similar to prior, small pneumomediastinum  Micro Data:  COVID 11/10 >> negative Influenza 11/10 >> negative  MRSA PCR 11/10 >>  Sputum 11/14 >> serratia >> R-cefazolin, S-ceftriaxone  Antimicrobials:  Azithro 11/10 x1  Clinda 11/10 x1  Doxycycline 11/10 >> 11/14  Levofloxacin 11/14 >> 11/19  Interim history/subjective:  G tube yesterday with IR  Hypoglycemic overnight,  started on D10W  Objective   Blood pressure (!) 102/45, pulse (!) 56, temperature 98.4 F (36.9 C), temperature source Oral, resp. rate 16, height 5\' 5"  (1.651 m), weight 111.8 kg, SpO2 93 %.    FiO2 (%):  [60 %-80 %] 60 %   Intake/Output Summary (Last 24 hours) at 03/23/2020 0738 Last data filed at 03/23/2020 0500 Gross per 24 hour  Intake 75.58 ml  Output 600 ml  Net -524.42 ml   Filed Weights   03/17/20 0500 03/18/20 0500 03/23/20 0500  Weight: 112.5 kg 112.5 kg 111.8 kg    Exam: General: Chronically ill appearing older adult F, reclined in bed NAD  HENT: #6 cuffed trach secure.  Pulm: Symmetrical chest expansion, no accessory use. Expiratory wheeze.  CV: RRR distant heart sounds 2+ pulses GI: obese soft round normoactive. 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 No intubation from above due to airway occlusion. Plan - Continue BID pulmicort and duoneb -wean O2 as able for SpO2 goal 88-92%   -Recommend Maretta Bees  and brovana at dc (DO NOT SEND HOME ON DRY POWDER NEBS) -Cont slow pred taper -Aspiration precautions -Not PMV candidate  -remains off nocturnal vent, tolerating well  -Pulm hygiene, CPT   GERD -PPI  Inadequate PO intake -s/p G tube 11/22 with IR  -EN   Best practice:  Diet:EN via G tube   Pain/Anxiety/Delirium protocol (if indicated): Fentanyl prn VAP protocol (if indicated): HOB elevated   DVT prophylaxis: Lovenox GI prophylaxis: PPI Glucose control: SSI Mobility: as tolerated  Code Status: Full code Family Communication: pt updated 11/23. Family communication per primary  Disposition: per primary. PCCM will follow for trach assist.     Eliseo Gum MSN, AGACNP-BC [place] [phone]  [phone] 03/23/2020, 7:39 AM

## 2020-03-23 NOTE — Progress Notes (Signed)
Chaplain engaged in initial visit with Pamala Hurry.  During visit, Antonela shared that she wishes to go home.  She has been independent all of her life and the thought of having to go to a facility to live, as mentioned by a physician, has left her feeling down.  She is adamant about receiving care at home.  Davonna is also in a place of understanding that she has multiple things that need to be in place before she can go home, which include her oxygen.  Being in this waiting period of cultures coming back and seeing if she will be removed off her current status has been hard for her.  Chaplain offered the ministries of prayer and listening with Sadhana, uplifting the journey she is on and praying for her requests to be honored.  Zoraida also shared that her son has been dealing with his own health crisis.  Chaplain and Naquisha prayed over son's health.  Chaplain will continue to follow-up.    03/23/20 1200  Clinical Encounter Type  Visited With Patient  Visit Type Initial;Spiritual support  Referral From Nurse;Care management  Consult/Referral To Chaplain

## 2020-03-23 NOTE — Progress Notes (Signed)
Occupational Therapy Treatment Patient Details Name: Veronica Horton MRN: [id number] DOB: 1960/11/03 Today's Date: 03/23/2020    History of present illness Veronica Horton is a 59 year old female with medical history significant for COPD with chronic hypoxic respiratory failure on 5 L, HTN, tobacco use, obesity class 3 who presented on 11/10 with worsening shortness of breath over the past 4 days and increased confusion. Pt found to be in COPD exacerbation, hospital course at [facility] complicated by dysphasia, pt found to have a laryngeal mass. Pt transferred to [facility]. Pt underwent trach placement on 11/13 s/p layrngeal mass biopsy, went on vent 11/14 in AM due to desaturation..11/22 G-tube   OT comments  This 59 yo female seen today to look at bed mobility, transfers and grooming. Pt needing a little bit more A today--had G-tube placed yesterday. She continues to be motivated to get up and move and wants to go home, but needs to be more independent and family/patient need to be educated on how to manage her trach and G-tube. She will continue to benefit from acute OT with follow up on CIR now recommended.  Follow Up Recommendations  CIR;Supervision/Assistance - 24 hour    Equipment Recommendations  3 in 1 bedside commode       Precautions / Restrictions Precautions Precautions: Fall Precaution Comments: Vent with trach, G-tube Restrictions Weight Bearing Restrictions: No       Mobility Bed Mobility Overal bed mobility: Needs Assistance Bed Mobility: Supine to Sit     Supine to sit: Min assist     General bed mobility comments: Pt able get to 3/4 of the way up with VCs for hand placement--needed remainder of A for trunk  Transfers Overall transfer level: Needs assistance Equipment used: 1 person hand held assist Transfers: Sit to/from [facility] Sit to Stand: Min assist Stand pivot transfers: Min assist            Balance Overall balance assessment: Needs  assistance Sitting-balance support: No upper extremity supported;Feet supported Sitting balance-Leahy Scale: Good     Standing balance support: Single extremity supported Standing balance-Leahy Scale: Poor                             ADL either performed or assessed with clinical judgement   ADL Overall ADL's : Needs assistance/impaired     Grooming: Wash/dry face;Set up;Sitting Grooming Details (indicate cue type and reason): in recliner                 Toilet Transfer: Minimal assistance;Stand-pivot Toilet Transfer Details (indicate cue type and reason): bed>recliner                 Vision Baseline Vision/History: Wears glasses Wears Glasses: Reading only Patient Visual Report: No change from baseline            Cognition Arousal/Alertness: Awake/alert Behavior During Therapy: WFL for tasks assessed/performed Overall Cognitive Status: Within Functional Limits for tasks assessed                                                     Pertinent Vitals/ Pain       Pain Assessment: Faces Faces Pain Scale: Hurts little more Pain Location: adomen where new G--tube is Pain Descriptors / Indicators: Grimacing;Guarding Pain Intervention(s): Limited  activity within patient's tolerance;Monitored during session         Frequency  Min 2X/week        Progress Toward Goals  OT Goals(current goals can now be found in the care plan section)  Progress towards OT goals: Not progressing toward goals - comment (has had a couple medical issues/procedures since eval, but still very motivated to get better and go home)  Acute Rehab OT Goals Patient Stated Goal: to go home OT Goal Formulation: With patient Time For Goal Achievement: 03/29/20 Potential to Achieve Goals: Good  Plan Discharge plan needs to be updated       AM-PAC OT "6 Clicks" Daily Activity     Outcome Measure   Help from another person eating meals?: Total  (NPO) Help from another person taking care of personal grooming?: A Little Help from another person toileting, which includes using toliet, bedpan, or urinal?: A Lot Help from another person bathing (including washing, rinsing, drying)?: A Lot Help from another person to put on and taking off regular upper body clothing?: A Little Help from another person to put on and taking off regular lower body clothing?: A Lot 6 Click Score: 13    End of Session Equipment Utilized During Treatment: Oxygen (10 liters 60% trach collar)  OT Visit Diagnosis: Unsteadiness on feet (R26.81);Muscle weakness (generalized) (M62.81);Pain Pain - part of body:  (abdomen)   Activity Tolerance Patient tolerated treatment well   Patient Left in chair;with call bell/phone within reach   Nurse Communication          Time: 8329-1916 OT Time Calculation (min): 36 min  Charges: OT General Charges $OT Visit: 1 Visit OT Treatments $Self Care/Home Management : 23-37 mins  Veronica Horton, OTR/L Acute [employer] Pager [phone] Office [phone]      Veronica Horton 03/23/2020, 10:57 AM

## 2020-03-23 NOTE — Progress Notes (Signed)
[place] Progress Note Patient Name: Veronica Horton DOB: November 21, 1960 MRN: [id number]   Date of Service  03/23/2020  HPI/Events of Note  Hypoglycemia episodes X 2  - Blood glucose = 66 and 72. Already given D50.  eICU Interventions  Plan: 1. D10W to run IV at 30 mL/hour.      Intervention Category Major Interventions: Other:  Lysle Dingwall 03/23/2020, 1:39 AM

## 2020-03-23 NOTE — Progress Notes (Signed)
At 20:11pm patient BS=66 hypoglycemic, asymptomatic lying in her bed. Given Glucagon IV and rechecked BS=88. At 1:00am BS=72, again asymptomatic. Called E-link to inform and was given order to start D10 IVF at 63ml/hr.

## 2020-03-23 NOTE — Progress Notes (Signed)
Nutrition Brief Note  Received message from RN that MD wants TF order placed and initiated.  Will begin Osmolite 1.5 @ 40 ml/hr, advance by 10 ml every 4 hours to goal rate of 60 ml/hr via PEG.   Once tolerating at goal rate, can then transition to bolus feeds.  Goal for discharge: -Bolus feeds of 237 ml Osmolite 1.5 -6 times daily -Free water flush with 60 ml before and after each bolus feed -Provides 2130 kcals, 89g protein and 1806 ml H2O  If further nutrition issues arise, please consult RD.   Clayton Bibles, MS, RD, LDN Inpatient Clinical Dietitian Contact information available via Amion

## 2020-03-23 NOTE — Progress Notes (Signed)
Patient declined CPT at this time due to pain.

## 2020-03-24 LAB — CBC WITH DIFFERENTIAL/PLATELET
Abs Immature Granulocytes: 0.07 10*3/uL (ref 0.00–0.07)
Basophils Absolute: 0 10*3/uL (ref 0.0–0.1)
Basophils Relative: 0 %
Eosinophils Absolute: 0.1 10*3/uL (ref 0.0–0.5)
Eosinophils Relative: 1 %
HCT: 40.6 % (ref 36.0–46.0)
Hemoglobin: 13.1 g/dL (ref 12.0–15.0)
Immature Granulocytes: 1 %
Lymphocytes Relative: 14 %
Lymphs Abs: 1.4 10*3/uL (ref 0.7–4.0)
MCH: 31.6 pg (ref 26.0–34.0)
MCHC: 32.3 g/dL (ref 30.0–36.0)
MCV: 97.8 fL (ref 80.0–100.0)
Monocytes Absolute: 1 10*3/uL (ref 0.1–1.0)
Monocytes Relative: 10 %
Neutro Abs: 7.4 10*3/uL (ref 1.7–7.7)
Neutrophils Relative %: 74 %
Platelets: 174 10*3/uL (ref 150–400)
RBC: 4.15 MIL/uL (ref 3.87–5.11)
RDW: 15.2 % (ref 11.5–15.5)
WBC: 10 10*3/uL (ref 4.0–10.5)
nRBC: 0 % (ref 0.0–0.2)

## 2020-03-24 LAB — GLUCOSE, CAPILLARY
Glucose-Capillary: 110 mg/dL — ABNORMAL HIGH (ref 70–99)
Glucose-Capillary: 114 mg/dL — ABNORMAL HIGH (ref 70–99)
Glucose-Capillary: 117 mg/dL — ABNORMAL HIGH (ref 70–99)
Glucose-Capillary: 117 mg/dL — ABNORMAL HIGH (ref 70–99)
Glucose-Capillary: 91 mg/dL (ref 70–99)
Glucose-Capillary: 93 mg/dL (ref 70–99)
Glucose-Capillary: 95 mg/dL (ref 70–99)

## 2020-03-24 LAB — COMPREHENSIVE METABOLIC PANEL
ALT: 19 U/L (ref 0–44)
AST: 14 U/L — ABNORMAL LOW (ref 15–41)
Albumin: 2.8 g/dL — ABNORMAL LOW (ref 3.5–5.0)
Alkaline Phosphatase: 49 U/L (ref 38–126)
Anion gap: 10 (ref 5–15)
BUN: 28 mg/dL — ABNORMAL HIGH (ref 6–20)
CO2: 22 mmol/L (ref 22–32)
Calcium: 8.3 mg/dL — ABNORMAL LOW (ref 8.9–10.3)
Chloride: 105 mmol/L (ref 98–111)
Creatinine, Ser: 0.96 mg/dL (ref 0.44–1.00)
GFR, Estimated: >60 mL/min (ref >60)
Glucose, Bld: 134 mg/dL — ABNORMAL HIGH (ref 70–99)
Potassium: 3.8 mmol/L (ref 3.5–5.1)
Sodium: 137 mmol/L (ref 135–145)
Total Bilirubin: 0.9 mg/dL (ref 0.3–1.2)
Total Protein: 5.3 g/dL — ABNORMAL LOW (ref 6.5–8.1)

## 2020-03-24 MED ORDER — DIPHENHYDRAMINE HCL 12.5 MG/5ML PO ELIX: 12.5000 mg | ORAL_SOLUTION | Freq: Four times a day (QID) | ORAL | Status: DC | PRN

## 2020-03-24 MED ORDER — DICLOFENAC SODIUM 1 % EX GEL
2.0000 g | Freq: Four times a day (QID) | CUTANEOUS | Status: DC | PRN
  Administered 2020-03-25 – 2020-03-27 (×2): 2 g via TOPICAL
  Filled 2020-03-24: qty 100

## 2020-03-24 MED ORDER — PREDNISONE 20 MG PO TABS
20.0000 mg | ORAL_TABLET | Freq: Every day | ORAL | Status: AC
  Administered 2020-03-25: 20 mg
  Filled 2020-03-24: qty 1

## 2020-03-24 MED ORDER — PANTOPRAZOLE SODIUM 40 MG PO PACK
40.0000 mg | PACK | Freq: Every day | ORAL | Status: DC
  Administered 2020-03-24 – 2020-03-27 (×4): 40 mg
  Filled 2020-03-24 (×5): qty 20

## 2020-03-24 MED ORDER — HYDROXYZINE HCL 10 MG/5ML PO SYRP
10.0000 mg | ORAL_SOLUTION | Freq: Three times a day (TID) | ORAL | Status: DC | PRN
  Filled 2020-03-24: qty 5

## 2020-03-24 MED ORDER — DICLOFENAC SODIUM 1 % EX GEL: 2.0000 g | Freq: Four times a day (QID) | CUTANEOUS | Status: DC

## 2020-03-24 MED ORDER — ORAL CARE MOUTH RINSE
15.0000 mL | Freq: Two times a day (BID) | OROMUCOSAL | Status: DC
  Administered 2020-03-24 – 2020-04-16 (×40): 15 mL via OROMUCOSAL

## 2020-03-24 MED ORDER — CHLORHEXIDINE GLUCONATE 0.12 % MT SOLN
15.0000 mL | Freq: Two times a day (BID) | OROMUCOSAL | Status: DC
  Administered 2020-03-24 – 2020-04-16 (×46): 15 mL via OROMUCOSAL
  Filled 2020-03-24 (×44): qty 15

## 2020-03-24 NOTE — Progress Notes (Signed)
NAME:  Veronica Horton, MRN:  [id number], DOB:  1960-11-06, LOS: 54 ADMISSION DATE:  03/10/2020, CONSULTATION DATE:  03/24/20 REFERRING MD:  Hospitalist, CHIEF COMPLAINT:  Respiratory failure   Brief History   59 y.o. F with PMH of COPD 11/10 with shortness of breath found to have laryngeal mass concerning for malignancy s/p biopsy and tracheostomy.  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  She was transferred to the intensive care unit post biopsy as she required ventilator support.  Tx to [facility] on 11/17 for ONC therapy.   Past Medical History  COPD, Bronchitis,Class III Obesity ,DJD,[facility] [place] Hospital Events   11/10 admit to hospitalists with SOB 11/13 laryngoscopy and biopsy with tracheostomy 11/15 transferred to intensive care unit 11/15 transition to trach collar 11/17 Tx to [facility] for ONC therapy. 24 hours on ATC.  11/22 G Tube with IR  11/23 starting EN   Consults:  ENT Oncology PCCM  Procedures:  ENT Trach 11/13 > IR GTube 11/22>   Significant Diagnostic Tests:  11/11 CT soft tissue neck with contrast >> Bulky bilateral supraglottic tumor with possible airway Compromise, Involvement of right laryngeal cartilages, with extension in the midline anterior to the strap muscles, and also extension through the right paraglottic space and/or inseparable malignant right level IIIa lymph node. 11/15 CT chest/abdomen/pelvis >> no signs of metastatic disease, rounded partially necrotic lymph node to the left neck, basilar consolidative changes similar to prior, small pneumomediastinum  Micro Data:  COVID 11/10 >> negative Influenza 11/10 >> negative  MRSA PCR 11/10 >>  Sputum 11/14 >> serratia >> R-cefazolin, S-ceftriaxone Trach aspirate 11/21> normal flora   Antimicrobials:  Azithro 11/10 x1  Clinda 11/10 x1  Doxycycline 11/10 >> 11/14  Levofloxacin 11/14 >> 11/19  Interim history/subjective:  Tolerating  EN  Remains off of nocturnal vent   Trach collar 40%  Objective   Blood pressure (!) 107/49, pulse 61, temperature 98.1 F (36.7 C), temperature source Oral, resp. rate 17, height 5\' 5"  (1.651 m), weight 111.5 kg, SpO2 92 %.    FiO2 (%):  [40 %-60 %] 40 %   Intake/Output Summary (Last 24 hours) at 03/24/2020 1013 Last data filed at 03/24/2020 0500 Gross per 24 hour  Intake 980 ml  Output 700 ml  Net 280 ml   Filed Weights   03/18/20 0500 03/23/20 0500 03/24/20 0500  Weight: 112.5 kg 111.8 kg 111.5 kg    Exam: General: Chronically ill appearing adult F, reclined in bed, trach collar, NAD  HENT: #6 cuffed trach secure. Moderate tan secretions. Pulm: Symmetrical chest expansion. Scattered rhonchi  CV: RRR distant heart sounds. Cap refill brisk  GI: Obese soft round. Slight tenderness surrounding G tube site. Normoactive bowel sounds  Ext: no obvious joint deformity. 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 No intubation from above due to airway occlusion. Plan -  Continue BID pulmicort and duoneb -wean O2 as able for SpO2 goal 88-92%   -Recommend Yupelri and brovana at dc (DO NOT SEND HOME ON DRY POWDER NEBS) -Cont slow pred taper -Aspiration precautions -Not PMV candidate  -Pulm hygiene, CPT   GERD -PPI  Inadequate PO intake -s/p G  tube 11/22 with IR  -EN   Best practice:  Diet:EN via G tube  Pain/Anxiety/Delirium protocol (if indicated): Fentanyl prn VAP protocol (if indicated): HOB elevated   DVT prophylaxis: Lovenox GI prophylaxis: PPI Glucose control: SSI Mobility: as tolerated  Code Status: Full code Family Communication: pt updated 11/24 Disposition: per primary -- PCCM following for trach   Eliseo Gum MSN, AGACNP-BC [place] [phone] If no answer, [phone] 03/24/2020, 10:13 AM

## 2020-03-24 NOTE — Progress Notes (Signed)

## 2020-03-24 NOTE — Progress Notes (Signed)
HEMATOLOGY-ONCOLOGY PROGRESS NOTE  SUBJECTIVE: Overall reports that she is feeling well today.  She is not needing vent support anymore.  She is on 8 L trach collar at the time of my visit. Feeding through PEG tube.  REVIEW OF SYSTEMS:   Constitutional: Denies fevers, chills Eyes: Denies blurriness of vision Ears, nose, mouth, throat, and face: Denies mucositis or sore throat Respiratory: No shortness of breath reported today.  Cardiovascular: Denies palpitation, chest discomfort Gastrointestinal:  Denies nausea, heartburn or change in bowel habits Behavioral/Psych: Mood is stable, no new changes  Extremities: No lower extremity edema All other systems were reviewed with the patient and are negative.  I have reviewed the past medical history, past surgical history, social history and family history with the patient and they are unchanged from previous note.   PHYSICAL EXAMINATION: ECOG PERFORMANCE STATUS: 3 - Symptomatic, >50% confined to bed  Vitals:   03/24/20 1235 03/24/20 1346  BP: (!) 140/104   Pulse: 67   Resp: 17   Temp:    SpO2: 92% 91%   Filed Weights   03/18/20 0500 03/23/20 0500 03/24/20 0500  Weight: 248 lb 0.3 oz (112.5 kg) 246 lb 7.6 oz (111.8 kg) 245 lb 13 oz (111.5 kg)    Intake/Output from previous day: 11/23 0701 - 11/24 0700 In: 1076.1 [I.V.:96.1; NG/GT:980] Out: 700 [Urine:700]  GENERAL:alert, no distress and comfortable, on trach collar, communicates by writing on the note pad SKIN: skin color, texture, turgor are normal, no rashes or significant lesions EYES: normal, Conjunctiva are pink and non-injected, sclera clear NECK: Tracheostomy midline without erythema or drainage, palpable large left cervical LN LYMPH: Palpable cervical lymph nodes on the left LUNGS: clear to auscultation and percussion with normal breathing effort, ant lung fields HEART: regular rate & rhythm and no murmurs and no lower extremity edema ABDOMEN:abdomen soft, non-tender  and normal bowel sounds NEURO: alert & oriented No LE edema.  LABORATORY DATA:  I have reviewed the data as listed CMP Latest Ref Rng & Units 03/24/2020 03/23/2020 03/20/2020  Glucose 70 - 99 mg/dL 134(H) 81 86  BUN 6 - 20 mg/dL 28(H) 29(H) 31(H)  Creatinine 0.44 - 1.00 mg/dL 0.96 0.81 0.91  Sodium 135 - 145 mmol/L 137 139 141  Potassium 3.5 - 5.1 mmol/L 3.8 4.4 4.6  Chloride 98 - 111 mmol/L 105 107 104  CO2 22 - 32 mmol/L 22 23 27   Calcium 8.9 - 10.3 mg/dL 8.3(L) 8.4(L) 8.4(L)  Total Protein 6.5 - 8.1 g/dL 5.3(L) - -  Total Bilirubin 0.3 - 1.2 mg/dL 0.9 - -  Alkaline Phos 38 - 126 U/L 49 - -  AST 15 - 41 U/L 14(L) - -  ALT 0 - 44 U/L 19 - -    Lab Results  Component Value Date   WBC 10.0 03/24/2020   HGB 13.1 03/24/2020   HCT 40.6 03/24/2020   MCV 97.8 03/24/2020   PLT 174 03/24/2020   NEUTROABS 7.4 03/24/2020    CT SOFT TISSUE NECK W CONTRAST  Addendum Date: 03/11/2020   ADDENDUM REPORT: 03/11/2020 16:36 ADDENDUM: Study discussed by telephone with Dr. Jolaine Artist MEMON on 03/11/2020 at 1631 hours. Electronically Signed   By: Genevie Ann M.D.   On: 03/11/2020 16:36   Result Date: 03/11/2020 CLINICAL DATA:  59 year old female with dysphagia. Tonsillitis suspected. EXAM: CT NECK WITH CONTRAST TECHNIQUE: Multidetector CT imaging of the neck was performed using the standard protocol following the bolus administration of intravenous contrast. CONTRAST:  96mL OMNIPAQUE IOHEXOL  300 MG/ML  SOLN COMPARISON:  Chest CT 01/28/2019. FINDINGS: Pharynx and larynx: Bulky soft tissue mass throughout the bilateral supraglottic larynx and affecting the hypopharynx. Lobulated diffuse thickening of the epiglottis (series 2, image 53). Diffuse false cord and anterior commissure involvement with evidence of extension into the right paraglottic space on series 2 image 61 - where nodular direct extension of tumor and/or inseparable abnormal right level 3 lymph node protrudes on coronal image 58. Asymmetric  erosion of the undersurface of the right thyroid cartilage on image 65. Asymmetric sclerosis of the right arytenoid on image 67., and midline extension of tumor is suspected through the anterior commissure a distance of about 11 mm as seen on series 2, image 67 and sagittal image 48. All told, tumor size is estimated at 37 x 52 by 45 mm (AP by transverse by CC). There is evidence of airway compromise. The true cords may be spared as seen on series 2, image 71. Subglottic larynx is within normal limits. Above the vallecula pharyngeal contours appear normal. Normal superior parapharyngeal and retropharyngeal spaces. Salivary glands: Negative sublingual space. Submandibular glands and parotid glands remain within normal limits. Thyroid: Negative. Lymph nodes: Malignant left level IIIb lymph node measures 17 mm short axis and 29 mm long axis (series 2, image 55 and coronal image 73). As stated above it is possible of malignant right level IIIa lymph node is inseparable from the parent tumor along the right paraglottic space (coronal image 58). Smaller but asymmetrically enlarged left level 4 nodes measure up to 9 mm short axis (coronal image 72). No other abnormal or suspicious lymph nodes identified. Vascular: Major vascular structures in the neck and at the skull base remain patent including both internal jugular veins. The left vertebral artery appears dominant. Calcified atherosclerosis at the skull base. Limited intracranial: Negative. Visualized orbits: Negative. Mastoids and visualized paranasal sinuses: Clear. Skeleton: Absent and carious dentition. Cervical spine degeneration. No suspicious osseous lesion identified. 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 No superior mediastinal lymphadenopathy. Visible axillary lymph nodes are normal. Mild dependent atelectasis.  No upper lung nodule identified. IMPRESSION: 1. 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  Recommend ENT consultation. Involvement of right laryngeal cartilages, with extension in the midline anterior to the strap muscles, and also extension through the right paraglottic space and/or inseparable malignant right level IIIa lymph node. Estimated tumor long axis 5.2 cm. 2. Malignant contralateral left level 3b lymph node is 2.9 cm long axis. Smaller indeterminate left level 4 lymph nodes. 3. No distant metastatic disease identified in the neck or upper chest. Electronically Signed: By: Genevie Ann M.D. On: 03/11/2020 16:23   IR GASTROSTOMY TUBE MOD SED  Result Date: 03/22/2020 INDICATION: Head and neck malignancy, dysphagia EXAM: FLUOROSCOPIC 16 FRENCH BALLOON RETENTION GASTROSTOMY MEDICATIONS: 1 G VANCOMYCIN; Antibiotics were administered within 1 hour of the procedure. GLUCAGON 0.5 MG IV ANESTHESIA/SEDATION: Versed 0 mg IV; Fentanyl 50 mcg IV Moderate Sedation Time:  12 MINUTES The patient was continuously monitored during the procedure by the interventional radiology nurse under my direct supervision. CONTRAST:  10 CC-administered into the gastric lumen. FLUOROSCOPY TIME:  Fluoroscopy Time: 1 minutes 36 seconds (53 mGy). COMPLICATIONS: None immediate. PROCEDURE: Informed written consent was obtained from the patient after a thorough discussion of the procedural risks, benefits and alternatives. All questions were addressed. Maximal Sterile Barrier Technique was utilized including caps, mask, sterile gowns, sterile gloves, sterile drape, hand hygiene and skin antiseptic. A timeout was performed prior  to the initiation of the procedure. Previous imaging reviewed. Patient has an existing feeding tube within the stomach. This was utilized to insufflate the stomach with air. The stomach was localized beneath the left subcostal margin in the left abdomen with biplane fluoroscopy. Overlying skin marked. Under sterile conditions and local anesthesia, introducer needle was advanced into the stomach. Contrast  injection confirms percutaneous needle access within the stomach. Single T tack deployed for gastropexy. Amplatz guidewire inserted. Tract dilatation performed to insert the 16 French balloon retention gastrostomy. Balloon tip inflated with 7 cc mixture of saline and 1 cc contrast. This was retracted against the anterior gastric wall. Contrast injection confirms position in the stomach. Images obtained for documentation. T tacks secured to the skin site. A sterile dressing applied. No immediate complication. Patient tolerated the procedure well. IMPRESSION: Successful fluoroscopic 72 French balloon retention gastrostomy Electronically Signed   By: Jerilynn Mages.  Shick M.D.   On: 03/22/2020 16:53   CT CHEST ABDOMEN PELVIS W CONTRAST  Result Date: 03/15/2020 CLINICAL DATA:  New diagnosis of head neck mass EXAM: CT CHEST, ABDOMEN, AND PELVIS WITH CONTRAST TECHNIQUE: Multidetector CT imaging of the chest, abdomen and pelvis was performed following the standard protocol during bolus administration of intravenous contrast. CONTRAST:  17mL OMNIPAQUE IOHEXOL 300 MG/ML  SOLN COMPARISON:  Prior swallowing evaluation and CT of the neck. FINDINGS: CT CHEST FINDINGS Cardiovascular: Calcified coronary artery disease. Aorta is normal caliber. 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 Central pulmonary vasculature is mildly engorged. Approximately 3 cm greatest caliber unchanged from previous exam. Unremarkable on limited venous phase assessment. Mediastinum/Nodes: Supraglottic mass partially imaged on the first image of the data set. Interval placement of tracheostomy tube with resultant pneumomediastinum in the anterior mediastinum in upper mediastinum. No adenopathy within the mediastinum or axilla. No hilar adenopathy. Rounded partial necrotic lymph node in the LEFT neck partially visualized, corresponding to level IIIb lymph node seen on the neck CT. Lungs/Pleura: Basilar consolidative changes  bilaterally with similar pattern of consolidation though decreased volume loss when compared to the study of September of 2020. Airways are patent. Musculoskeletal: See below for full musculoskeletal detail. CT ABDOMEN PELVIS FINDINGS Hepatobiliary: Liver without focal lesion. Post cholecystectomy. No biliary duct dilation. Pancreas: Mild atrophy of the pancreas without focal lesion, ductal dilation or inflammation. Spleen: Spleen normal in size and contour. Adrenals/Urinary Tract: Adrenal glands are normal. Symmetric renal enhancement. No hydronephrosis. LEFT renal cysts, largest arising from the lower pole measuring 2.8 x 2.9 cm. Urinary bladder under distended limiting assessment. Stomach/Bowel: Gastrointestinal tract with signs of colonic diverticulosis. No acute bowel process. Rectus diastasis. Postoperative changes in the midline of the abdomen related to prior ventral hernia repair. Mild bulging at the site of hernia repair, no herniation beyond the inserted mesh near the umbilicus. Vascular/Lymphatic: Calcified atheromatous plaque in the abdominal aorta. No aneurysmal dilation. There is no gastrohepatic or hepatoduodenal ligament lymphadenopathy. No retroperitoneal or mesenteric lymphadenopathy. No pelvic sidewall lymphadenopathy. Reproductive: No adnexal mass. Reproductive structures are unremarkable. Other: Post abdominal wall reconstruction with rectus diastasis, no frank hernia. Musculoskeletal: Spinal degenerative changes. Degenerative changes in glenohumeral joints and hips. Ununited fractures along posterior LEFT chest involving ribs 10 through 12. These appear subacute or chronic. These did not appear to be present on previous imaging from September of 2020. IMPRESSION: 1. Supraglottic mass partially imaged on the first image of the data set. No signs of metastatic disease to the chest, abdomen or pelvis. 2. Rounded partially necrotic lymph  node in the LEFT neck partially visualized, corresponding to  level IIIb lymph node seen on the neck CT. 3. Basilar consolidative changes bilaterally with similar pattern of consolidation though decreased volume loss when compared to the study of September of 2020. Findings may be related to aspiration. No definite lesions seen in the chest though these areas could obscure underlying lesions. 4. Postoperative changes with small amount of pneumomediastinum presumably related to recent tracheostomy tube insertion. 5. Ununited fractures along posterior LEFT chest involving ribs 10 through 12. These appear subacute or chronic. These did not appear to be present on previous imaging from September of 2020. 6. Post abdominal wall reconstruction with rectus diastasis, no frank hernia. 7. Aortic atherosclerosis. Aortic Atherosclerosis (ICD10-I70.0). Electronically Signed   By: Zetta Bills M.D.   On: 03/15/2020 10:53   DG CHEST PORT 1 VIEW  Result Date: 03/23/2020 CLINICAL DATA:  Hypoxia. EXAM: PORTABLE CHEST 1 VIEW COMPARISON:  Left 19 2021. FINDINGS: Interim removal of feeding tube. Tracheostomy tube in stable position. Cardiomegaly. Persistent bibasilar atelectasis/infiltrates and small bilateral pleural effusions. Density noted over the right mid chest may represent atelectasis and or pleural fluid pseudotumor, no interim change. IMPRESSION: 1. Interim removal of feeding tube. Tracheostomy tube in stable position. 2. Stable cardiomegaly. 3. Persistent bibasilar atelectasis/infiltrates and small bilateral pleural effusions. Density of the right mid chest may represent atelectasis and or pleural fluid pseudotumor, no interim change. Electronically Signed   By: Marcello Moores  Register   On: 03/23/2020 06:39   DG CHEST PORT 1 VIEW  Result Date: 03/19/2020 CLINICAL DATA:  Pneumonia. EXAM: PORTABLE CHEST 1 VIEW COMPARISON:  03/18/2020. FINDINGS: Tracheostomy tube, feeding tube in stable position. Stable cardiomegaly. Improved pulmonary venous congestion. Lung volumes. Persistent  left base atelectasis/infiltrate. Persistent right base subsegmental atelectasis. Elliptical density noted over the right mid chest may represent atelectasis and or fissural fluid. Pleural effusions cannot be excluded. No pneumothorax. IMPRESSION: 1. Lines and tubes in stable position. 2. Stable cardiomegaly. Improved pulmonary venous congestion. 3. Persistent left base atelectasis/infiltrate. Persistent right base subsegmental atelectasis. Elliptical density noted over the right mid chest may represent atelectasis and or fissural fluid. Small pleural effusions cannot be excluded. Electronically Signed   By: Marcello Moores  Register   On: 03/19/2020 06:03   DG Chest Port 1 View  Result Date: 03/18/2020 CLINICAL DATA:  Respiratory failure. EXAM: PORTABLE CHEST 1 VIEW COMPARISON:  03/17/2020. FINDINGS: Tracheostomy tube and feeding tube in stable position. Cardiomegaly. Mild pulmonary venous congestion. Mild bilateral interstitial prominence. Mild component of CHF cannot be excluded. Persistent left lower lobe atelectasis/infiltrate. New onset right mid lung prominent atelectatic changes. No pneumothorax. IMPRESSION: 1. Tracheostomy tube and feeding tube in stable position. 2. Cardiomegaly with mild pulmonary venous congestion and mild bilateral interstitial prominence. Mild component of CHF cannot be excluded. 3. Persistent left lower lobe atelectasis/infiltrate. New onset of right mid lung prominent atelectatic changes. Electronically Signed   By: Marcello Moores  Register   On: 03/18/2020 07:34   DG CHEST PORT 1 VIEW  Result Date: 03/17/2020 CLINICAL DATA:  Respiratory failure EXAM: PORTABLE CHEST 1 VIEW COMPARISON:  March 14, 2020 FINDINGS: Tracheostomy catheter tip is 6.2 cm above the carina. Feeding tube tip is below the diaphragm. No pneumothorax. There is a small left pleural effusion with consolidation in the left lower lobe. There is right base atelectasis with equivocal small right pleural effusion. Heart is  mildly enlarged, stable, with pulmonary vascularity normal. No adenopathy. No bone lesions. IMPRESSION: Tube positions as described without pneumothorax. Left  pleural effusion with equivocal right pleural effusion. Airspace opacity consistent with combination of atelectasis and pneumonia left lower lobe. Mild atelectasis right base. Stable cardiac prominence. Electronically Signed   By: Lowella Grip III M.D.   On: 03/17/2020 09:02   DG CHEST PORT 1 VIEW  Result Date: 03/14/2020 CLINICAL DATA:  Respiratory failure with hypercapnia EXAM: PORTABLE CHEST 1 VIEW COMPARISON:  March 10, 2020 FINDINGS: Tracheostomy catheter tip is 6.2 cm above the carina. There is ill-defined airspace opacity in the left upper lobe and left base regions with equivocal left pleural effusion. The right lung is clear. Heart is upper normal in size with pulmonary vascularity normal. No adenopathy. No bone lesions. IMPRESSION: Tracheostomy as described without pneumothorax. Patchy airspace opacity consistent with scattered areas of pneumonia in the left upper lobe and left base. Equivocal left pleural effusion. Right lung clear. Stable cardiac silhouette. Electronically Signed   By: Lowella Grip III M.D.   On: 03/14/2020 09:17   DG Chest Portable 1 View  Result Date: 03/10/2020 CLINICAL DATA:  Shortness of breath and cough EXAM: PORTABLE CHEST 1 VIEW COMPARISON:  January 23, 2020 FINDINGS: There is bibasilar atelectatic change. Elsewhere the interstitium is mildly thickened. No consolidation. Heart size and pulmonary vascularity are normal. No adenopathy. There is degenerative change in each shoulder. IMPRESSION: Bibasilar atelectasis. Interstitial thickening likely represents a degree of underlying chronic bronchitis. No edema or airspace opacity. Heart size within normal limits. Electronically Signed   By: Lowella Grip III M.D.   On: 03/10/2020 12:42   DG Swallowing Func-Speech Pathology  Result Date:  03/11/2020 Objective Swallowing Evaluation: Type of Study: MBS-Modified Barium Swallow Study  Patient Details Name: Veronica Horton MRN: [id number] Date of Birth: 08-Feb-1961 Today's Date: 03/11/2020 Time: SLP Start Time (ACUTE ONLY): 5809 -SLP Stop Time (ACUTE ONLY): 1316 SLP Time Calculation (min) (ACUTE ONLY): 21 min Past Medical History: Past Medical History: Diagnosis Date . Bronchitis  . Class 3 obesity 01/23/2020 . COPD (chronic obstructive pulmonary disease) ([place])  . Degenerative disc disease, lumbar  . Hiatal hernia  . Hypertension  Past Surgical History: Past Surgical History: Procedure Laterality Date . CHOLECYSTECTOMY   . degenerative bone disease   . INCISIONAL HERNIA REPAIR N/A 01/15/2019  Procedure: [name] HERNIORRHAPHY WITH MESH;  Surgeon: [name], MD;  Location: [facility];  Service: General;  Laterality: N/A; . OMENTECTOMY N/A 01/15/2019  Procedure: OMENTECTOMY;  Surgeon: [name], MD;  Location: [facility];  Service: General;  Laterality: N/A; HPI: Asianae Minkler  is a 59 y.o. female, with medical history significant of class III obesity, hiatal hernia, hypertension, bronchitis, COPD, chronic respiratory failure on home oxygen at 5 LPM, active smoker of 1-1/2 packs of cigarettes per day , patient presents to ED secondary to shortness of breath and altered mental status, patient with receptive worsening dyspnea over the last 4 days, she is not vaccinated against Covid, she is altered at the time of my examination, history was obtained from boyfriend at bedside, and ED staff, patient still smoking, she still using 5 L of oxygen, he is with increased work of breathing, significantly dyspneic, upon presentation to ED she is altered not provide any complaints at this point. BSE requested.  Subjective: "I haven't been able to swallow my pills for a couple weeks." Assessment / Plan / Recommendation CHL IP CLINICAL IMPRESSIONS 03/11/2020 Clinical Impression Pt presents with mild pharyngeal phase  dysphagia, however appearance of anatomy appears edematous (epiglottis and aryepiglottic folds). Pt with limited dentition and requires  extra time to masticate solids, swallow trigger generally at the level of the valleculae, Pt with blunted appearance of epiglottis with reduced deflection resulting in variable trace, flash penetration of thins during the swallow without aspiration and min vallecular residue with solids and brief stasis of barium tablet in valleculae. Pt with prominent cricopharyngeus. Recommend D3/mech soft and thin liquids, po medications whole in puree and follow with liquid wash or per Pt preference. Also strongly recommend additional imaging (neck CT) and ENT consult pending those results. SLP will follow pending results of imaging. Above to RN and MD.  SLP Visit Diagnosis Dysphagia, oropharyngeal phase (R13.12) Attention and concentration deficit following -- Frontal lobe and executive function deficit following -- Impact on safety and function Mild aspiration risk   CHL IP TREATMENT RECOMMENDATION 03/11/2020 Treatment Recommendations No treatment recommended at this time   Prognosis 03/11/2020 Prognosis for Safe Diet Advancement Fair Barriers to Reach Goals Severity of deficits Barriers/Prognosis Comment -- CHL IP DIET RECOMMENDATION 03/11/2020 SLP Diet Recommendations Dysphagia 3 (Mech soft) solids;Thin liquid Liquid Administration via Cup;Straw Medication Administration Whole meds with puree Compensations Slow rate;Small sips/bites Postural Changes Remain semi-upright after after feeds/meals (Comment);Seated upright at 90 degrees   CHL IP OTHER RECOMMENDATIONS 03/11/2020 Recommended Consults Consider ENT evaluation Oral Care Recommendations Oral care BID Other Recommendations Clarify dietary restrictions   CHL IP FOLLOW UP RECOMMENDATIONS 03/11/2020 Follow up Recommendations None   CHL IP FREQUENCY AND DURATION 03/11/2020 Speech Therapy Frequency (ACUTE ONLY) min 2x/week Treatment Duration  1 week      CHL IP ORAL PHASE 03/11/2020 Oral Phase WFL Oral - Pudding Teaspoon -- Oral - Pudding Cup -- Oral - Honey Teaspoon -- Oral - Honey Cup -- Oral - Nectar Teaspoon -- Oral - Nectar Cup -- Oral - Nectar Straw -- Oral - Thin Teaspoon -- Oral - Thin Cup -- Oral - Thin Straw -- Oral - Puree -- Oral - Mech Soft -- Oral - Regular -- Oral - Multi-Consistency -- Oral - Pill -- Oral Phase - Comment --  CHL IP PHARYNGEAL PHASE 03/11/2020 Pharyngeal Phase Impaired Pharyngeal- Pudding Teaspoon -- Pharyngeal -- Pharyngeal- Pudding Cup -- Pharyngeal -- Pharyngeal- Honey Teaspoon -- Pharyngeal -- Pharyngeal- Honey Cup -- Pharyngeal -- Pharyngeal- Nectar Teaspoon -- Pharyngeal -- Pharyngeal- Nectar Cup -- Pharyngeal -- Pharyngeal- Nectar Straw Pharyngeal residue - valleculae;Reduced epiglottic inversion Pharyngeal -- Pharyngeal- Thin Teaspoon Delayed swallow initiation-vallecula;Reduced epiglottic inversion Pharyngeal -- Pharyngeal- Thin Cup Reduced epiglottic inversion;Penetration/Aspiration during swallow Pharyngeal Material enters airway, remains ABOVE vocal cords then ejected out Pharyngeal- Thin Straw Reduced epiglottic inversion;Penetration/Aspiration during swallow Pharyngeal Material enters airway, remains ABOVE vocal cords then ejected out Pharyngeal- Puree WFL Pharyngeal -- Pharyngeal- Mechanical Soft -- Pharyngeal -- Pharyngeal- Regular Pharyngeal residue - valleculae;Reduced epiglottic inversion Pharyngeal -- Pharyngeal- Multi-consistency -- Pharyngeal -- Pharyngeal- Pill Pharyngeal residue - valleculae;Reduced epiglottic inversion Pharyngeal -- Pharyngeal Comment edematous pharynx  CHL IP CERVICAL ESOPHAGEAL PHASE 03/11/2020 Cervical Esophageal Phase Impaired Pudding Teaspoon -- Pudding Cup -- Honey Teaspoon -- Honey Cup -- Nectar Teaspoon -- Nectar Cup -- Nectar Straw -- Thin Teaspoon -- Thin Cup Prominent cricopharyngeal segment Thin Straw -- Puree -- Mechanical Soft -- Regular -- Multi-consistency -- Pill  -- Cervical Esophageal Comment -- Thank you, [name], [place] 03/11/2020, 2:12 PM              ECHOCARDIOGRAM COMPLETE  Result Date: 03/11/2020    ECHOCARDIOGRAM REPORT   Patient Name:   Veronica Horton Date of Exam: 03/11/2020 Medical Rec #:  [id number]       Height:       65.0 in Accession #:    [id number]      Weight:       244.0 lb Date of Birth:  29-May-1960       BSA:          2.153 m Patient Age:    73 years        BP:           115/35 mmHg Patient Gender: F               HR:           57 bpm. Exam Location:  [facility] Procedure: 2D Echo Indications:    Dyspnea 786.09 / R06.00  History:        Patient has prior history of Echocardiogram examinations, most                 recent 01/16/2019. COPD; Risk Factors:Hypertension and Current                 Smoker. Acute respiratory failure with hypoxia.  Sonographer:    Leavy Cella RDCS (AE) Referring Phys: [id number] DAWOOD S ELGERGAWY IMPRESSIONS  1. Left ventricular ejection fraction, by estimation, is 70 to 75%. The left ventricle has hyperdynamic function. The left ventricle has no regional wall motion abnormalities. There is mild left ventricular hypertrophy. Left ventricular diastolic parameters were normal.  2. Right ventricular systolic function is normal. The right ventricular size is normal. Tricuspid regurgitation signal is inadequate for assessing PA pressure.  3. The mitral valve is grossly normal. Trivial mitral valve regurgitation.  4. The aortic valve is tricuspid. Aortic valve regurgitation is not visualized.  5. The inferior vena cava is normal in size with greater than 50% respiratory variability, suggesting right atrial pressure of 3 mmHg. FINDINGS  Left Ventricle: Left ventricular ejection fraction, by estimation, is 70 to 75%. The left ventricle has hyperdynamic function. The left ventricle has no regional wall motion abnormalities. The left ventricular internal cavity size was normal in size. There is mild  left ventricular hypertrophy. Left ventricular diastolic parameters were normal. Right Ventricle: The right ventricular size is normal. No increase in right ventricular wall thickness. Right ventricular systolic function is normal. Tricuspid regurgitation signal is inadequate for assessing PA pressure. Left Atrium: Left atrial size was normal in size. Right Atrium: Right atrial size was normal in size. Pericardium: There is no evidence of pericardial effusion. Mitral Valve: The mitral valve is grossly normal. Trivial mitral valve regurgitation. Tricuspid Valve: The tricuspid valve is grossly normal. Tricuspid valve regurgitation is trivial. Aortic Valve: The aortic valve is tricuspid. Aortic valve regurgitation is not visualized. Pulmonic Valve: The pulmonic valve was not well visualized. Pulmonic valve regurgitation is not visualized. Aorta: The aortic root is normal in size and structure. Venous: The inferior vena cava is normal in size with greater than 50% respiratory variability, suggesting right atrial pressure of 3 mmHg. IAS/Shunts: No atrial level shunt detected by color flow Doppler.  LEFT VENTRICLE PLAX 2D LVIDd:         4.05 cm  Diastology LVIDs:         2.30 cm  LV e' medial:    8.59 cm/s LV PW:         1.35 cm  LV E/e' medial:  12.2 LV IVS:        1.34 cm  LV e' lateral:   11.00 cm/s LVOT diam:  2.00 cm  LV E/e' lateral: 9.5 LVOT Area:     3.14 cm  RIGHT VENTRICLE RV S prime:     15.40 cm/s TAPSE (M-mode): 2.9 cm LEFT ATRIUM             Index       RIGHT ATRIUM           Index LA diam:        4.70 cm 2.18 cm/m  RA Area:     13.50 cm LA Vol (A2C):   73.3 ml 34.05 ml/m RA Volume:   35.40 ml  16.44 ml/m LA Vol (A4C):   39.3 ml 18.26 ml/m LA Biplane Vol: 55.6 ml 25.83 ml/m   AORTA Ao Root diam: 2.70 cm MITRAL VALVE MV Area (PHT): 2.69 cm     SHUNTS MV Decel Time: 282 msec     Systemic Diam: 2.00 cm MV E velocity: 105.00 cm/s MV A velocity: 57.80 cm/s MV E/A ratio:  1.82 Rozann Lesches MD  Electronically signed by Rozann Lesches MD Signature Date/Time: 03/11/2020/4:59:31 PM    Final     ASSESSMENT AND PLAN: 59 year old Caucasian female, with medical history of heavy smoking, COPD on home oxygen 5 L/min, hypertension, presents with worsening dyspnea and altered mental status.  Work-up showed a large laryngeal mass.  Patient required urgent tracheostomy.  1.  Supraglottic laryngeal cancer, [id number] 2. COPD with acute exacerbation, on chronic home oxygen 5 L/min 3. HTN  4.  Heavy smoking history  Recommendations:  -I have reviewed her initial CT scan and her records.  She has T4aN2c supraglottic laryngeal cancer, with compromised airway, required urgent tracheostomy.  -For staging scan, we ideally would like to get a PET scan.  However this is not feasible while she is in hospital, and there is quite bit social difficult to get her treated as outpt (she lives an hour away and has no transportation). CT C/A/P performed did not show evidence of distant mets.  - Ideally for T4a tumors, total laryngectomy with bilateral neck dissection is the way to go but patient refused surgery. I did explain to her if patient declines surgery, we can proceed with concurrent chemo radiation although it is likely that she may need salvage surgery if she has incomplete response. Given baseline PS, she is not an ideal candidate for every 21 day cisplatin, hence we can proceed with weekly cisplatin. Informed consent obtained. I have discussed adverse effects from cisplatin including but not limited to fatigue, nausea, vomiting, increased risk of infections which can be fatal occasionally, ototoxicity resulting in permanent hearing loss at times, nephrotoxicity which can occasionally be permanent. She understands that the treatment can be tough and life threatening rarely but she agrees to proceed PEG for feeding. Anticipate CRT start on Monday, will coordinate with Dr Isidore Moos. Dr Lorenso Courier with see the  patient with our inpt NP on Monday before chemo start. No contraindications to chemo from my review today, GFR greater than 60 today. Disposition plan, yet to be determined, I discussed with hospitalist team early this week and there appear to be some social issues with placement, hence this is still being evaluated.  Benay Pike, MD

## 2020-03-24 NOTE — Progress Notes (Signed)
PROGRESS NOTE    Veronica Horton  KGM:[id number] DOB: 02/13/1961 DOA: 03/10/2020 PCP: [facility]   Chief Complaint  Patient presents with  . Shortness of Breath   Brief Narrative: Patient is Veronica Horton 59 year old female with history of morbid obesity, hiatal hernia, hypertension, COPD, chronic respiratory failure on home oxygen at 5 L/min, smoker who presents to the emergency room  On 03/10/20 with complaint of shortness of breath, altered mental status.  On presentation ABG showed PCO2 of 92 and she had to be kept on BiPAP.  She was found to have laryngeal mass concerning for malignancy and she underwent biopsy which showed invasive squamous  cell carcinoma and she required tracheostomy.  She had to be transferred to ICU post biopsy and had to be kept on ventilator support.  She was transferred to [facility] on 11/17 for radiation therapy.  ransfer to [facility] service from [facility] service on 03/20/2020. Underwent  PEG placement  by IR on 03/22/2020. Plan is to start concurrent chemo radiation during this hospitalization while seeking placement in CIR.  Oncology/radiation oncology following.  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  Found to have airway obstruction/compromise due to laryngeal mass.  CT soft tissue neck on 11/11 showed bulky bilateral supraglottic tumor with possible airway compromise, involvement of right laryngeal cartilages.  ENT was consulted.  She underwent laryngeal biopsy which  showed squamous cell carcinoma.  Status post tracheostomy Underwent PEG placement by IR on 11/22. CT chest/abdomen/pelvis did not show signs of metastatic disease. She was recommended total laryngectomy with bilateral neck dissection but she refused surgery.  Pan is to start concurrent chemoradiation during this hospitalization.Oncology/radiation oncology following.  Per oncology, anticipate CRT Monday.  Acute on chronic hypoxic/hypercarbic respiratory failure: History of COPD, on oxygen at home at 5 L/min.  Chronic respiratory failure exacerbated by laryngeal squamous cell carcinoma, COPD exacerbation, pneumonia.  Hospital course was remarkable for Serratia pneumonia.  Finished the course of antibiotics with levofloxacin.  Currently on trach collar, currently requiring 10 L by trach collar, awaiting improving improvement in her oxygenations  CXR 11/23 persistent bibasilar atelectassi/infiltrates and small bilateral effusions  COPD exacerbation: Being treated with steroids, bronchodilators.  Continue pulmonary hygiene, ventilator support at night, trach collar during the day.  Follow intermittent chest x-ray.  Aspiration precautions.  Continue tapering steroids.  Hypoglycemia: resolved with tube feeds, continue to monitor  GERD: Continue PPI  Hypertension: Monitor blood pressure.  Off antihypertensives which she was at home.  Blood pressure okay.  AKI: Resolved  Thrombocytopenia: resolved.  Disposition: PT/OT recommending CIR.  She denies skilled nursing facility placement.  Plan is to start on concurrent chemoradiation during this hospitalization.  Awaiting improvement in oxygen needs prior to CIR.  Nutrition Problem: Increased nutrient needs Etiology: cancer and cancer related treatments, chronic illness  DVT prophylaxis:lovenox Code Status:full  Family Communication: [name] Disposition:   Status is: Inpatient  Remains inpatient appropriate because:Inpatient level of care  appropriate due to severity of illness   Dispo: The patient  is from: Home              Anticipated d/c is to: CIR              Anticipated d/c date is: > 3 days              Patient currently is not medically stable to d/c.       Consultants:   PCCM  Rad onc  Oncology  ENT  Procedures:  tracheostomy  Echo IMPRESSIONS    1. Left ventricular ejection fraction, by estimation, is 70 to 75%. The  left ventricle has hyperdynamic function. The left ventricle has no  regional wall motion abnormalities. There is mild left ventricular  hypertrophy. Left ventricular diastolic  parameters were normal.  2. Right ventricular systolic function is normal. The right ventricular  size is normal. Tricuspid regurgitation signal is inadequate for assessing  PA pressure.  3. The mitral valve is grossly normal. Trivial mitral valve  regurgitation.  4. The aortic valve is tricuspid. Aortic valve regurgitation is not  visualized.  5. The inferior vena cava is normal in size with greater than 50%  respiratory variability, suggesting right atrial pressure of 3 mmHg.  Antimicrobials:  Anti-infectives (From admission, onward)   Start     Dose/Rate Route Frequency Ordered Stop   03/22/20 1700  vancomycin (VANCOCIN) IVPB 1000 mg/200 mL premix  Status:  Discontinued        1,000 mg 200 mL/hr over 60 Minutes Intravenous  Once 03/22/20 1605 03/23/20 0236   03/22/20 1604  vancomycin (VANCOCIN) 1-5 GM/200ML-% IVPB       Note to Pharmacy: [name]   : cabinet override      03/22/20 1604 03/22/20 1615   03/14/20 1100  levofloxacin (LEVAQUIN) IVPB 750 mg        750 mg 100 mL/hr over 90 Minutes Intravenous Daily 03/14/20 0955 03/19/20 1146   03/10/20 1730  doxycycline (VIBRAMYCIN) 100 mg in sodium chloride 0.9 % 250 mL IVPB  Status:  Discontinued        100 mg 125 mL/hr over 120 Minutes Intravenous Every 12 hours 03/10/20 1634 03/14/20 0955   03/10/20 1600  azithromycin (ZITHROMAX) 500 mg  in sodium chloride 0.9 % 250 mL IVPB  Status:  Discontinued        500 mg 250 mL/hr over 60 Minutes Intravenous Every 24 hours 03/10/20 1529 03/10/20 1538   03/10/20 1500  clindamycin (CLEOCIN) IVPB 600 mg  Status:  Discontinued        600 mg 100 mL/hr over 30 Minutes Intravenous  Once 03/10/20 1458 03/10/20 1521         Subjective: C/o some msk pain, pain with cough, otherwise doing ok  Objective: Vitals:   03/24/20 0500 03/24/20 0600 03/24/20 0800 03/24/20 0821  BP:  (!) 107/49    Pulse:  61    Resp:  17    Temp:   98.1 F (36.7 C)   TempSrc:   Oral   SpO2:  (!) 89%  92%  Weight: 111.5 kg     Height:        Intake/Output Summary (Last 24 hours) at 03/24/2020 0936 Last data filed at 03/24/2020 0500 Gross per 24 hour  Intake 980 ml  Output 700 ml  Net 280 ml   Filed Weights   03/18/20 0500 03/23/20 0500 03/24/20 0500  Weight: 112.5 kg 111.8 kg 111.5 kg    Examination:  General exam: Appears calm and comfortable  Respiratory system: trach, unlabored Cardiovascular system: S1 & S2 heard, RRR Gastrointestinal system: Abdomen is nondistended, soft and nontender Central nervous system: Alert and oriented. No focal neurological deficits. Extremities:no LEE Skin: No rashes, lesions or ulcers Psychiatry: Judgement and insight appear normal. Mood & affect appropriate.     Data Reviewed: I have personally reviewed following labs and imaging studies  CBC: Recent Labs  Lab 03/18/20 0909 03/19/20 0254 03/20/20 0249 03/23/20 0301 03/24/20 0748  WBC 11.4* 13.7* 11.8* 13.4* 10.0  NEUTROABS  --   --   --  10.6* 7.4  HGB 14.2 14.1 14.8 13.9 13.1  HCT 45.9 44.3 47.6* 44.2 40.6  MCV 102.7* 98.9 101.3* 100.0 97.8  PLT 103* 122* 130* 163 154    Basic Metabolic Panel: Recent Labs  Lab 03/18/20 0909 03/19/20 0254 03/20/20 0249 03/23/20 0301 03/24/20 0748  NA 142 140 141 139 137  K 4.5 4.4 4.6 4.4 3.8  CL 105 106 104 107 105  CO2 32 30 27 23 22   GLUCOSE 123*  130* 86 81 134*  BUN 28* 30* 31* 29* 28*  CREATININE 0.91 0.97 0.91 0.81 0.96  CALCIUM 8.5* 8.5* 8.4* 8.4* 8.3*  MG 2.8* 2.6* 2.7*  --   --   PHOS 3.5 3.8 4.5  --   --     GFR: Estimated Creatinine Clearance: 77.5 mL/min (by C-G formula based on SCr of 0.96 mg/dL).  Liver Function Tests: Recent Labs  Lab 03/24/20 0748  AST 14*  ALT 19  ALKPHOS 49  BILITOT 0.9  PROT 5.3*  ALBUMIN 2.8*    CBG: Recent Labs  Lab 03/23/20 1622 03/23/20 1941 03/24/20 0018 03/24/20 0343 03/24/20 0732  GLUCAP 111* 104* 93 91 117*     Recent Results (from the past 240 hour(s))  Culture, respiratory     Status: None   Collection Time: 03/14/20 11:36 AM   Specimen: Tracheal Aspirate  Result Value Ref Range Status   Specimen Description TRACHEAL ASPIRATE  Final   Special Requests NONE  Final   Gram Stain   Final    NO WBC SEEN NO ORGANISMS SEEN Performed at [facility], [address] [place], [place] [address]    Culture RARE SERRATIA MARCESCENS  Final   Report Status 03/16/2020 FINAL  Final   Organism ID, Bacteria SERRATIA MARCESCENS  Final      Susceptibility   Serratia marcescens - MIC*    CEFAZOLIN >=64 RESISTANT Resistant     CEFEPIME <=0.12 SENSITIVE Sensitive     CEFTAZIDIME <=1 SENSITIVE Sensitive     CEFTRIAXONE <=0.25 SENSITIVE Sensitive     CIPROFLOXACIN <=0.25 SENSITIVE Sensitive     GENTAMICIN <=1 SENSITIVE Sensitive     TRIMETH/SULFA <=20 SENSITIVE Sensitive     * RARE SERRATIA MARCESCENS  Culture, respiratory (non-expectorated)     Status: None   Collection Time: 03/21/20  4:25 PM   Specimen: Tracheal Aspirate; Respiratory  Result Value Ref Range Status   Specimen Description   Final    TRACHEAL ASPIRATE Performed at [facility] [address] [place], [place] [address]    Special Requests   Final    NONE Performed at [facility], [place] [address], [place], [place] [address]    Gram Stain   Final    FEW WBC  PRESENT, PREDOMINANTLY PMN FEW GRAM POSITIVE COCCI IN PAIRS IN CLUSTERS RARE GRAM NEGATIVE RODS    Culture   Final    Normal respiratory  flora-no Staph aureus or Pseudomonas seen Performed at [facility] [address] [place], [place] [address]    Report Status 03/23/2020 FINAL  Final         Radiology Studies: IR GASTROSTOMY TUBE MOD SED  Result Date: 03/22/2020 INDICATION: Head and neck malignancy, dysphagia EXAM: FLUOROSCOPIC 16 FRENCH BALLOON RETENTION GASTROSTOMY MEDICATIONS: 1 G VANCOMYCIN; Antibiotics were administered within 1 hour of the procedure. GLUCAGON 0.5 MG IV ANESTHESIA/SEDATION: Versed 0 mg IV; Fentanyl 50 mcg IV Moderate Sedation Time:  12 MINUTES The patient was continuously monitored during the procedure by the interventional radiology nurse under my direct supervision. CONTRAST:  10 CC-administered into the gastric lumen. FLUOROSCOPY TIME:  Fluoroscopy Time: 1 minutes 36 seconds (53 mGy). COMPLICATIONS: None immediate. PROCEDURE: Informed written consent was obtained from the patient after Maxon Kresse thorough discussion of the procedural risks, benefits and alternatives. All questions were addressed. Maximal Sterile Barrier Technique was utilized including caps, mask, sterile gowns, sterile gloves, sterile drape, hand hygiene and skin antiseptic. Tema Alire timeout was performed prior to the initiation of the procedure. Previous imaging reviewed. Patient has an existing feeding tube within the stomach. This was utilized to insufflate the stomach with air. The stomach was localized beneath the left subcostal margin in the left abdomen with biplane fluoroscopy. Overlying skin marked. Under sterile conditions and local anesthesia, introducer needle was advanced into the stomach. Contrast injection confirms percutaneous needle access within the stomach. Single T tack deployed for gastropexy. Amplatz guidewire inserted. Tract dilatation performed to insert the 16 French balloon retention  gastrostomy. Balloon tip inflated with 7 cc mixture of saline and 1 cc contrast. This was retracted against the anterior gastric wall. Contrast injection confirms position in the stomach. Images obtained for documentation. T tacks secured to the skin site. Ivania Teagarden sterile dressing applied. No immediate complication. Patient tolerated the procedure well. IMPRESSION: Successful fluoroscopic 43 French balloon retention gastrostomy Electronically Signed   By: Jerilynn Mages.  Shick M.D.   On: 03/22/2020 16:53   DG CHEST PORT 1 VIEW  Result Date: 03/23/2020 CLINICAL DATA:  Hypoxia. EXAM: PORTABLE CHEST 1 VIEW COMPARISON:  Left 19 2021. FINDINGS: Interim removal of feeding tube. Tracheostomy tube in stable position. Cardiomegaly. Persistent bibasilar atelectasis/infiltrates and small bilateral pleural effusions. Density noted over the right mid chest may represent atelectasis and or pleural fluid pseudotumor, no interim change. IMPRESSION: 1. Interim removal of feeding tube. Tracheostomy tube in stable position. 2. Stable cardiomegaly. 3. Persistent bibasilar atelectasis/infiltrates and small bilateral pleural effusions. Density of the right mid chest may represent atelectasis and or pleural fluid pseudotumor, no interim change. Electronically Signed   By: Marcello Moores  Register   On: 03/23/2020 06:39        Scheduled Meds: . arformoterol  15 mcg Nebulization BID  . budesonide (PULMICORT) nebulizer solution  0.5 mg Nebulization BID  . chlorhexidine  15 mL Mouth Rinse BID  . Chlorhexidine Gluconate Cloth  6 each Topical Daily  . docusate  100 mg Per Tube BID  . enoxaparin (LOVENOX) injection  55 mg Subcutaneous Q24H  . insulin aspart  0-9 Units Subcutaneous Q4H  . ipratropium-albuterol  3 mL Nebulization TID  . mouth rinse  15 mL Mouth Rinse q12n4p  . multivitamin with minerals  1 tablet Per Tube Daily  . nicotine  21 mg Transdermal Daily  . pantoprazole (PROTONIX) IV  40 mg Intravenous Q24H  . predniSONE  20 mg Oral Q  breakfast   Continuous Infusions: . sodium chloride    .  feeding supplement (OSMOLITE 1.5 CAL) 60 mL/hr at 03/23/20 2200     LOS: 14 days    Time spent: over 30 min    Fayrene Helper, MD [facility]   To contact the attending provider between 7A-7P or the covering provider during after hours 7P-7A, please log into the web site [email or web address] and access using universal [place] password for that web site. If you do not have the password, please call the hospital operator.  03/24/2020, 9:36 AM

## 2020-03-25 ENCOUNTER — Inpatient Hospital Stay (HOSPITAL_COMMUNITY): Payer: Medicaid Other

## 2020-03-25 LAB — CBC WITH DIFFERENTIAL/PLATELET
Abs Immature Granulocytes: 0.07 10*3/uL (ref 0.00–0.07)
Basophils Absolute: 0 10*3/uL (ref 0.0–0.1)
Basophils Relative: 0 %
Eosinophils Absolute: 0.1 10*3/uL (ref 0.0–0.5)
Eosinophils Relative: 1 %
HCT: 40.9 % (ref 36.0–46.0)
Hemoglobin: 13.3 g/dL (ref 12.0–15.0)
Immature Granulocytes: 1 %
Lymphocytes Relative: 14 %
Lymphs Abs: 1.4 10*3/uL (ref 0.7–4.0)
MCH: 31.5 pg (ref 26.0–34.0)
MCHC: 32.5 g/dL (ref 30.0–36.0)
MCV: 96.9 fL (ref 80.0–100.0)
Monocytes Absolute: 0.9 10*3/uL (ref 0.1–1.0)
Monocytes Relative: 9 %
Neutro Abs: 7.3 10*3/uL (ref 1.7–7.7)
Neutrophils Relative %: 75 %
Platelets: 159 10*3/uL (ref 150–400)
RBC: 4.22 MIL/uL (ref 3.87–5.11)
RDW: 15 % (ref 11.5–15.5)
WBC: 9.7 10*3/uL (ref 4.0–10.5)
nRBC: 0 % (ref 0.0–0.2)

## 2020-03-25 LAB — COMPREHENSIVE METABOLIC PANEL
ALT: 17 U/L (ref 0–44)
AST: 14 U/L — ABNORMAL LOW (ref 15–41)
Albumin: 2.8 g/dL — ABNORMAL LOW (ref 3.5–5.0)
Alkaline Phosphatase: 54 U/L (ref 38–126)
Anion gap: 10 (ref 5–15)
BUN: 25 mg/dL — ABNORMAL HIGH (ref 6–20)
CO2: 23 mmol/L (ref 22–32)
Calcium: 8.4 mg/dL — ABNORMAL LOW (ref 8.9–10.3)
Chloride: 105 mmol/L (ref 98–111)
Creatinine, Ser: 0.84 mg/dL (ref 0.44–1.00)
GFR, Estimated: >60 mL/min (ref >60)
Glucose, Bld: 115 mg/dL — ABNORMAL HIGH (ref 70–99)
Potassium: 3.9 mmol/L (ref 3.5–5.1)
Sodium: 138 mmol/L (ref 135–145)
Total Bilirubin: 0.6 mg/dL (ref 0.3–1.2)
Total Protein: 5.4 g/dL — ABNORMAL LOW (ref 6.5–8.1)

## 2020-03-25 LAB — PHOSPHORUS: Phosphorus: 4.6 mg/dL (ref 2.5–4.6)

## 2020-03-25 LAB — GLUCOSE, CAPILLARY
Glucose-Capillary: 121 mg/dL — ABNORMAL HIGH (ref 70–99)
Glucose-Capillary: 129 mg/dL — ABNORMAL HIGH (ref 70–99)
Glucose-Capillary: 142 mg/dL — ABNORMAL HIGH (ref 70–99)
Glucose-Capillary: 102 mg/dL — ABNORMAL HIGH (ref 70–99)
Glucose-Capillary: 93 mg/dL (ref 70–99)

## 2020-03-25 LAB — MAGNESIUM: Magnesium: 2.4 mg/dL (ref 1.7–2.4)

## 2020-03-25 NOTE — Progress Notes (Signed)
Pt. has strong productive cough for small amount white sputum.

## 2020-03-25 NOTE — Progress Notes (Signed)
Chaplain engaged in follow-up visit with Veronica Horton.  [place] shared that they had found a bed for her.  Chaplain continued to offer support and let her know that chaplains are there for her.  Tal[place] shared that she is feeling better.  Chaplain will follow-up as needed.    03/25/20 1000  Clinical Encounter Type  Visited With Patient  Visit Type Follow-up

## 2020-03-25 NOTE — Progress Notes (Addendum)
PROGRESS NOTE    Veronica Horton  KTG:[id number] DOB: 1961/01/21 DOA: 03/10/2020 PCP: [facility]   Chief Complaint  Patient presents with  . Shortness of Breath   Brief Narrative: Patient is Veronica Horton 59 year old female with history of morbid obesity, hiatal hernia, hypertension, COPD, chronic respiratory failure on home oxygen at 5 L/min, smoker who presents to the emergency room  On 03/10/20 with complaint of shortness of breath, altered mental status.  On presentation ABG showed PCO2 of 92 and she had to be kept on BiPAP.  She was found to have laryngeal mass concerning for malignancy and she underwent biopsy which showed invasive squamous  cell carcinoma and she required tracheostomy.  She had to be transferred to ICU post biopsy and had to be kept on ventilator support.  She was transferred to [facility] on 11/17 for radiation therapy.  ransfer to [facility] service from [facility] service on 03/20/2020. Underwent  PEG placement  by IR on 03/22/2020. Plan is to start concurrent chemo radiation during this hospitalization while seeking placement in CIR.  Oncology/radiation oncology following.  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  Found to have airway obstruction/compromise due to laryngeal mass.  CT soft tissue neck on 11/11 showed bulky bilateral supraglottic tumor with possible airway compromise, involvement of right laryngeal cartilages.  ENT was consulted.  She underwent laryngeal biopsy which  showed squamous cell carcinoma.   Status post tracheostomy Underwent PEG placement by IR on 11/22. CT chest/abdomen/pelvis did not show signs of metastatic disease. She was recommended total laryngectomy with bilateral neck dissection but she declined surgery.  Pan is to start concurrent chemoradiation during this hospitalization.  Oncology/radiation oncology following.  Per oncology, anticipate CRT Monday.  Acute on chronic hypoxic/hypercarbic respiratory failure: History of COPD, on oxygen at home at 5 L/min.  Chronic respiratory failure exacerbated by laryngeal squamous cell carcinoma, COPD exacerbation, pneumonia.   Hospital course was remarkable for Serratia pneumonia.  Finished the course of antibiotics with levofloxacin.  Currently on trach collar, currently requiring 10 L by trach collar, intermittently desatting to mid 80's, fluctuating into the 90's Repeat CXR 11/25 pending  COPD exacerbation: Being treated with steroids, bronchodilators.  Continue pulmonary hygiene, ventilator support at night, trach collar during the day.  Follow intermittent chest x-ray.  Aspiration precautions.  Continue tapering steroids.  No wheezing appreciated.  Hypoglycemia: resolved with tube feeds, continue to monitor  GERD: Continue PPI  Hypertension: Monitor blood pressure.  Off antihypertensives which she was at home.  Blood pressure okay.  AKI: Resolved  Thrombocytopenia: resolved.  Disposition: PT/OT recommending CIR.  She denies skilled nursing facility placement.  Plan is to start on concurrent chemoradiation during this hospitalization.  Awaiting improvement in oxygen needs prior to CIR.  Nutrition Problem: Increased nutrient needs Etiology: cancer and cancer related treatments, chronic illness  DVT prophylaxis:lovenox Code Status:full  Family Communication: [name] 11/24 - called 11/25, no answer Disposition:   Status is: Inpatient  Remains inpatient appropriate  because:Inpatient level of care  appropriate due to severity of illness   Dispo: The patient is from: Home              Anticipated d/c is to: CIR              Anticipated d/c date is: > 3 days              Patient currently is not medically stable to d/c.       Consultants:   PCCM  Rad onc  Oncology  ENT  Procedures:  tracheostomy  Echo IMPRESSIONS    1. Left ventricular ejection fraction, by estimation, is 70 to 75%. The  left ventricle has hyperdynamic function. The left ventricle has no  regional wall motion abnormalities. There is mild left ventricular  hypertrophy. Left ventricular diastolic  parameters were normal.  2. Right ventricular systolic function is normal. The right ventricular  size is normal. Tricuspid regurgitation signal is inadequate for assessing  PA pressure.  3. The mitral valve is grossly normal. Trivial mitral valve  regurgitation.  4. The aortic valve is tricuspid. Aortic valve regurgitation is not  visualized.  5. The inferior vena cava is normal in size with greater than 50%  respiratory variability, suggesting right atrial pressure of 3 mmHg.  Antimicrobials:  Anti-infectives (From admission, onward)   Start     Dose/Rate Route Frequency Ordered Stop   03/22/20 1700  vancomycin (VANCOCIN) IVPB 1000 mg/200 mL premix  Status:  Discontinued        1,000 mg 200 mL/hr over 60 Minutes Intravenous  Once 03/22/20 1605 03/23/20 0236   03/22/20 1604  vancomycin (VANCOCIN) 1-5 GM/200ML-% IVPB       Note to Pharmacy: [name]   : cabinet override      03/22/20 1604 03/22/20 1615   03/14/20 1100  levofloxacin (LEVAQUIN) IVPB 750 mg        750 mg 100 mL/hr over 90 Minutes Intravenous Daily 03/14/20 0955 03/19/20 1146   03/10/20 1730  doxycycline (VIBRAMYCIN) 100 mg in sodium chloride 0.9 % 250 mL IVPB  Status:  Discontinued        100 mg 125 mL/hr over 120 Minutes Intravenous Every 12 hours 03/10/20 1634 03/14/20 0955   03/10/20 1600   azithromycin (ZITHROMAX) 500 mg in sodium chloride 0.9 % 250 mL IVPB  Status:  Discontinued        500 mg 250 mL/hr over 60 Minutes Intravenous Every 24 hours 03/10/20 1529 03/10/20 1538   03/10/20 1500  clindamycin (CLEOCIN) IVPB 600 mg  Status:  Discontinued        600 mg 100 mL/hr over 30 Minutes Intravenous  Once 03/10/20 1458 03/10/20 1521         Subjective: Denies pain or discomfort Denies SOB  Objective: Vitals:   03/25/20 0600 03/25/20 0700 03/25/20 0743 03/25/20 0806  BP: (!) 98/40     Pulse: (!) 54 (!) 51    Resp: 18 17    Temp:    98.2 F (36.8 C)  TempSrc:    Oral  SpO2: 94% 93% 91%   Weight:      Height:        Intake/Output Summary (Last 24 hours) at 03/25/2020 0836 Last data filed at 03/25/2020 0600 Gross per 24 hour  Intake 1580 ml  Output 800 ml  Net 780 ml   Filed Weights   03/18/20 0500 03/23/20 0500 03/24/20 0500  Weight: 112.5 kg 111.8 kg 111.5 kg    Examination:  General: No acute distress. Cardiovascular: Heart sounds show Aleatha Taite regular rate, and rhythm. No gallops or rubs. No murmurs. No JVD. Lungs: Clear to auscultation bilaterally.  Trach.  Abdomen: Soft, nontender, nondistended  Neurological: Alert and oriented 3. Moves all extremities 4 . Cranial nerves II through XII grossly intact. Skin: Warm and dry. No rashes or lesions. Extremities: No clubbing or cyanosis. No edema.    Data Reviewed: I have personally reviewed following labs and imaging studies  CBC: Recent Labs  Lab 03/19/20 0254 03/20/20 0249 03/23/20 0301 03/24/20 0748 03/25/20 0243  WBC 13.7* 11.8* 13.4* 10.0 9.7  NEUTROABS  --   --  10.6* 7.4 7.3  HGB 14.1 14.8 13.9 13.1 13.3  HCT 44.3 47.6* 44.2 40.6 40.9  MCV 98.9 101.3* 100.0 97.8 96.9  PLT 122* 130* 163 174 027    Basic Metabolic Panel: Recent Labs  Lab 03/18/20 0909 03/18/20 0909 03/19/20 0254 03/20/20 0249 03/23/20 0301 03/24/20 0748 03/25/20 0243  NA 142   < > 140 141 139 137 138  K 4.5   < >  4.4 4.6 4.4 3.8 3.9  CL 105   < > 106 104 107 105 105  CO2 32   < > 30 27 23 22 23   GLUCOSE 123*   < > 130* 86 81 134* 115*  BUN 28*   < > 30* 31* 29* 28* 25*  CREATININE 0.91   < > 0.97 0.91 0.81 0.96 0.84  CALCIUM 8.5*   < > 8.5* 8.4* 8.4* 8.3* 8.4*  MG 2.8*  --  2.6* 2.7*  --   --  2.4  PHOS 3.5  --  3.8 4.5  --   --  4.6   < > = values in this interval not displayed.    GFR: Estimated Creatinine Clearance: 88.6 mL/min (by C-G formula based on SCr of 0.84 mg/dL).  Liver Function Tests: Recent Labs  Lab 03/24/20 0748 03/25/20 0243  AST 14* 14*  ALT 19 17  ALKPHOS 49 54  BILITOT 0.9 0.6  PROT 5.3* 5.4*  ALBUMIN 2.8* 2.8*    CBG: Recent Labs  Lab 03/24/20 1535 03/24/20 2032 03/24/20 2323 03/25/20 0350 03/25/20 0749  GLUCAP 117* 95 110* 121* 129*     Recent Results (from the past 240 hour(s))  Culture, respiratory (non-expectorated)     Status: None   Collection Time: 03/21/20  4:25 PM   Specimen: Tracheal Aspirate; Respiratory  Result Value Ref Range Status   Specimen Description   Final    TRACHEAL ASPIRATE Performed at [facility] [address], [name], [place] [address]    Special Requests   Final    NONE Performed at [facility], [place] [address] [place] [address], [place], [place] [address]    Gram Stain   Final    FEW WBC PRESENT, PREDOMINANTLY PMN FEW GRAM POSITIVE COCCI IN PAIRS IN CLUSTERS RARE GRAM NEGATIVE RODS    Culture   Final    Normal respiratory flora-no Staph aureus or Pseudomonas seen Performed at [facility], [address], [place], [place] [address]    Report Status 03/23/2020 FINAL  Final         Radiology Studies: No results found.      Scheduled Meds: . arformoterol  15 mcg Nebulization BID  . budesonide (PULMICORT) nebulizer solution  0.5 mg Nebulization BID  . chlorhexidine  15 mL Mouth Rinse BID  . Chlorhexidine Gluconate Cloth  6 each Topical Daily  .  docusate  100 mg Per Tube  BID  . enoxaparin (LOVENOX) injection  55 mg Subcutaneous Q24H  . insulin aspart  0-9 Units Subcutaneous Q4H  . ipratropium-albuterol  3 mL Nebulization TID  . mouth rinse  15 mL Mouth Rinse q12n4p  . multivitamin with minerals  1 tablet Per Tube Daily  . nicotine  21 mg Transdermal Daily  . pantoprazole sodium  40 mg Per Tube QHS  . predniSONE  20 mg Per Tube Q breakfast   Continuous Infusions: . sodium chloride    . feeding supplement (OSMOLITE 1.5 CAL) 60 mL/hr at 03/25/20 0600     LOS: 15 days    Time spent: over 30 min    Fayrene Helper, MD [facility]   To contact the attending provider between 7A-7P or the covering provider during after hours 7P-7A, please log into the web site [email or web address] and access using universal [place] password for that web site. If you do not have the password, please call the hospital operator.  03/25/2020, 8:36 AM

## 2020-03-26 LAB — MAGNESIUM: Magnesium: 2.5 mg/dL — ABNORMAL HIGH (ref 1.7–2.4)

## 2020-03-26 LAB — CBC WITH DIFFERENTIAL/PLATELET
Abs Immature Granulocytes: 0.1 10*3/uL — ABNORMAL HIGH (ref 0.00–0.07)
Basophils Absolute: 0 10*3/uL (ref 0.0–0.1)
Basophils Relative: 0 %
Eosinophils Absolute: 0.1 10*3/uL (ref 0.0–0.5)
Eosinophils Relative: 1 %
HCT: 40.8 % (ref 36.0–46.0)
Hemoglobin: 12.9 g/dL (ref 12.0–15.0)
Immature Granulocytes: 1 %
Lymphocytes Relative: 20 %
Lymphs Abs: 1.5 10*3/uL (ref 0.7–4.0)
MCH: 31.4 pg (ref 26.0–34.0)
MCHC: 31.6 g/dL (ref 30.0–36.0)
MCV: 99.3 fL (ref 80.0–100.0)
Monocytes Absolute: 0.6 10*3/uL (ref 0.1–1.0)
Monocytes Relative: 8 %
Neutro Abs: 5.2 10*3/uL (ref 1.7–7.7)
Neutrophils Relative %: 70 %
Platelets: 167 10*3/uL (ref 150–400)
RBC: 4.11 MIL/uL (ref 3.87–5.11)
RDW: 15.1 % (ref 11.5–15.5)
WBC: 7.6 10*3/uL (ref 4.0–10.5)
nRBC: 0 % (ref 0.0–0.2)

## 2020-03-26 LAB — COMPREHENSIVE METABOLIC PANEL
ALT: 17 U/L (ref 0–44)
AST: 15 U/L (ref 15–41)
Albumin: 2.7 g/dL — ABNORMAL LOW (ref 3.5–5.0)
Alkaline Phosphatase: 52 U/L (ref 38–126)
Anion gap: 8 (ref 5–15)
BUN: 22 mg/dL — ABNORMAL HIGH (ref 6–20)
CO2: 25 mmol/L (ref 22–32)
Calcium: 8.4 mg/dL — ABNORMAL LOW (ref 8.9–10.3)
Chloride: 104 mmol/L (ref 98–111)
Creatinine, Ser: 0.9 mg/dL (ref 0.44–1.00)
GFR, Estimated: >60 mL/min (ref >60)
Glucose, Bld: 141 mg/dL — ABNORMAL HIGH (ref 70–99)
Potassium: 4 mmol/L (ref 3.5–5.1)
Sodium: 137 mmol/L (ref 135–145)
Total Bilirubin: 0.7 mg/dL (ref 0.3–1.2)
Total Protein: 5.3 g/dL — ABNORMAL LOW (ref 6.5–8.1)

## 2020-03-26 LAB — GLUCOSE, CAPILLARY
Glucose-Capillary: 103 mg/dL — ABNORMAL HIGH (ref 70–99)
Glucose-Capillary: 123 mg/dL — ABNORMAL HIGH (ref 70–99)
Glucose-Capillary: 74 mg/dL (ref 70–99)
Glucose-Capillary: 75 mg/dL (ref 70–99)
Glucose-Capillary: 81 mg/dL (ref 70–99)
Glucose-Capillary: 95 mg/dL (ref 70–99)
Glucose-Capillary: 95 mg/dL (ref 70–99)

## 2020-03-26 LAB — PHOSPHORUS: Phosphorus: 4.5 mg/dL (ref 2.5–4.6)

## 2020-03-26 NOTE — Progress Notes (Signed)
NAME:  Veronica Horton, MRN:  [id number], DOB:  Jun 24, 1960, LOS: 29 ADMISSION DATE:  03/10/2020, CONSULTATION DATE:  03/26/20 REFERRING MD:  Hospitalist, CHIEF COMPLAINT:  Respiratory failure   Brief History   59 y.o. F with PMH of COPD 11/10 with shortness of breath found to have laryngeal mass concerning for malignancy s/p biopsy and tracheostomy.  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  She was transferred to the intensive care unit post biopsy as she required ventilator support.  Tx to [facility] on 11/17 for ONC therapy.   Past Medical History  COPD, Bronchitis,Class III Obesity ,DJD,Hiatal [place] Hospital Events   11/10 admit to hospitalists with SOB 11/13 laryngoscopy and biopsy with tracheostomy 11/15 transferred to intensive care unit 11/15 transition to trach collar 11/17 Tx to [facility] for ONC therapy. 24 hours on ATC.  11/22 G Tube with IR  11/23 starting EN   Consults:  ENT Oncology PCCM  Procedures:  ENT Trach 11/13 > IR GTube 11/22>   Significant Diagnostic Tests:  11/11 CT soft tissue neck with contrast >> Bulky bilateral supraglottic tumor with possible airway Compromise, Involvement of right laryngeal cartilages, with extension in the midline anterior to the strap muscles, and also extension through the right paraglottic space and/or inseparable malignant right level IIIa lymph node. 11/15 CT chest/abdomen/pelvis >> no signs of metastatic disease, rounded partially necrotic lymph node to the left neck, basilar consolidative changes similar to prior, small pneumomediastinum  Micro Data:  COVID 11/10 >> negative Influenza 11/10 >> negative  MRSA PCR 11/10 >>  Sputum 11/14 >> serratia >> R-cefazolin, S-ceftriaxone Trach aspirate 11/21> normal flora   Antimicrobials:  Azithro 11/10 x1  Clinda 11/10 x1  Doxycycline 11/10 >> 11/14  Levofloxacin 11/14 >> 11/19  Interim history/subjective:  Has moved out  of ICU  Tolerating EN  Trach collar 60%   Objective   Blood pressure 123/68, pulse (!) 58, temperature 99.1 F (37.3 C), temperature source Oral, resp. rate 18, height 5\' 5"  (1.651 m), weight 111.5 kg, SpO2 92 %.    FiO2 (%):  [60 %] 60 %   Intake/Output Summary (Last 24 hours) at 03/26/2020 1109 Last data filed at 03/25/2020 2140 Gross per 24 hour  Intake --  Output 400 ml  Net -400 ml   Filed Weights   03/18/20 0500 03/23/20 0500 03/24/20 0500  Weight: 112.5 kg 111.8 kg 111.5 kg    Exam: General: Chronically ill F, older adult. Reclined in bed NAD  HENT: #6 cuffed trach secure. Small amount of thick tan secretions. Trach collar 60%  Pulm: Symmetrical chest expansion, even unlabored respirations. Diminished bibasilar sounds. Scattered rhonchi, less than prior.  CV: rrr s1s2. Cap refill brisk   GI: Obese soft round. G Tube in place. + bowel sounds  Ext: BLE chronic darkened discoloration over shins. No obvious joint deformity. No cyanosis or clubbing  Neuro: Awake alert oriented. 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 No intubation from above due to airway occlusion. Plan -wean O2 as able for goal 88-92%  -BID duoneb, pulmicort  -Recommend Yupelri and brovana at dc (DO NOT SEND HOME ON DRY POWDER NEBS) -Not PMV candidate  -Cont pulm hygiene, mobility, CPT -aspiration  precautions   GERD -PPI  Inadequate PO intake -s/p G tube 11/22 with IR  -EN   Best practice:  Diet:EN via G tube  Pain/Anxiety/Delirium protocol (if indicated): Fentanyl prn VAP protocol (if indicated): HOB elevated   DVT prophylaxis: Lovenox GI prophylaxis: PPI Glucose control: SSI Mobility: PT/OT, continue efforts at advancement  Code Status: Full code Family Communication: pt updated 11/26 Disposition: per primary -- PCCM following for trach   Eliseo Gum MSN, AGACNP-BC [place] [phone] If no answer, [phone] 03/26/2020, 11:09 AM

## 2020-03-26 NOTE — Progress Notes (Signed)
Physical Therapy Treatment Patient Details Name: Veronica Horton MRN: [id number] DOB: 06-10-1960 Today's Date: 03/26/2020    History of Present Illness Veronica Horton is a 58 year old female with medical history significant for COPD with chronic hypoxic respiratory failure on 5 L, HTN, tobacco use, obesity class 3 who presented on 11/10 with worsening shortness of breath over the past 4 days and increased confusion. Pt found to be in COPD exacerbation, hospital course at [facility] complicated by dysphasia, pt found to have a laryngeal mass. Pt transferred to [facility]. Pt underwent trach placement on 11/13 s/p layrngeal mass biopsy, went on vent 11/14 in AM due to desaturation..11/22 G-tube    PT Comments    Pt ambulated in hallway with RW and mobilizing well.  Pt requiring min/guard mostly for lines/tubing.  Pt remains on 60% 10L trach collar.   Follow Up Recommendations  Home health PT;Supervision/Assistance - 24 hour     Equipment Recommendations  None recommended by PT    Recommendations for Other Services       Precautions / Restrictions Precautions Precautions: Fall Precaution Comments: trach collar, G-tube    Mobility  Bed Mobility Overal bed mobility: Needs Assistance Bed Mobility: Supine to Sit     Supine to sit: Min guard     General bed mobility comments: pt utilized bed rail and provided a hand to self assist trunk upright  Transfers Overall transfer level: Needs assistance Equipment used: Rolling walker (2 wheeled) Transfers: Sit to/from Stand Sit to Stand: Min guard         General transfer comment: min/guard for safety  Ambulation/Gait Ambulation/Gait assistance: Min guard Gait Distance (Feet): 200 Feet Assistive device: Rolling walker (2 wheeled) Gait Pattern/deviations: Decreased stride length;Step-through pattern     General Gait Details: tolerated distance well, min/guard for safey/lines   Stairs             Wheelchair Mobility     Modified Rankin (Stroke Patients Only)       Balance                                            Cognition Arousal/Alertness: Awake/alert Behavior During Therapy: WFL for tasks assessed/performed Overall Cognitive Status: Within Functional Limits for tasks assessed                                        Exercises      General Comments        Pertinent Vitals/Pain Pain Assessment: No/denies pain Pain Intervention(s): Repositioned;Monitored during session    Home Living                      Prior Function            PT Goals (current goals can now be found in the care plan section) Progress towards PT goals: Progressing toward goals    Frequency    Min 3X/week      PT Plan Current plan remains appropriate    Co-evaluation              AM-PAC PT "6 Clicks" Mobility   Outcome Measure  Help needed turning from your back to your side while in a flat bed without using bedrails?: None Help needed moving from lying on  your back to sitting on the side of a flat bed without using bedrails?: None Help needed moving to and from a bed to a chair (including a wheelchair)?: A Little Help needed standing up from a chair using your arms (e.g., wheelchair or bedside chair)?: A Little Help needed to walk in hospital room?: A Little Help needed climbing 3-5 steps with a railing? : A Little 6 Click Score: 20    End of Session Equipment Utilized During Treatment: Oxygen Activity Tolerance: Patient tolerated treatment well Patient left: in chair;with call bell/phone within reach Nurse Communication: Mobility status PT Visit Diagnosis: Muscle weakness (generalized) (M62.81);Difficulty in walking, not elsewhere classified (R26.2)     Time: 0370-4888 PT Time Calculation (min) (ACUTE ONLY): 29 min  Charges:  $Gait Training: 23-37 mins                     Veronica Horton PT, DPT Acute Rehabilitation Services Pager:  [phone] Office: [phone]  Veronica Horton E 03/26/2020, 1:20 PM

## 2020-03-26 NOTE — Progress Notes (Signed)
HEMATOLOGY-ONCOLOGY PROGRESS NOTE  SUBJECTIVE:   Patient was working with PT at the time of my visit. Using trach collar 8 L No pain Breathing stable. She feels well overall and is ready to start treatment.  REVIEW OF SYSTEMS:    Constitutional: Denies fevers, chills Eyes: Denies blurriness of vision Ears, nose, mouth, throat, and face: Denies mucositis or sore throat Respiratory: No shortness of breath reported today. Oxygen needs stable. Cardiovascular: Denies palpitation, chest discomfort Gastrointestinal:  Denies nausea, heartburn or change in bowel habits Behavioral/Psych: Mood is stable, no new changes  Extremities: LE edema improving. All other systems were reviewed with the patient and are negative.  I have reviewed the past medical history, past surgical history, social history and family history with the patient and they are unchanged from previous note.   PHYSICAL EXAMINATION: ECOG PERFORMANCE STATUS: 3 - Symptomatic, >50% confined to bed  Vitals:   03/26/20 0742 03/26/20 1159  BP:  (!) 107/59  Pulse:  64  Resp:  18  Temp:  97.9 F (36.6 C)  SpO2: 92% 94%   Filed Weights   03/18/20 0500 03/23/20 0500 03/24/20 0500  Weight: 248 lb 0.3 oz (112.5 kg) 246 lb 7.6 oz (111.8 kg) 245 lb 13 oz (111.5 kg)    Intake/Output from previous day: 11/25 0701 - 11/26 0700 In: -  Out: 400 [Urine:400]  GENERAL:alert, no distress and comfortable, on trach collar Venous stasis changes BLE Alert, oriented.  LABORATORY DATA:  I have reviewed the data as listed CMP Latest Ref Rng & Units 03/26/2020 03/25/2020 03/24/2020  Glucose 70 - 99 mg/dL 141(H) 115(H) 134(H)  BUN 6 - 20 mg/dL 22(H) 25(H) 28(H)  Creatinine 0.44 - 1.00 mg/dL 0.90 0.84 0.96  Sodium 135 - 145 mmol/L 137 138 137  Potassium 3.5 - 5.1 mmol/L 4.0 3.9 3.8  Chloride 98 - 111 mmol/L 104 105 105  CO2 22 - 32 mmol/L 25 23 22   Calcium 8.9 - 10.3 mg/dL 8.4(L) 8.4(L) 8.3(L)  Total Protein 6.5 - 8.1 g/dL 5.3(L)  5.4(L) 5.3(L)  Total Bilirubin 0.3 - 1.2 mg/dL 0.7 0.6 0.9  Alkaline Phos 38 - 126 U/L 52 54 49  AST 15 - 41 U/L 15 14(L) 14(L)  ALT 0 - 44 U/L 17 17 19     Lab Results  Component Value Date   WBC 7.6 03/26/2020   HGB 12.9 03/26/2020   HCT 40.8 03/26/2020   MCV 99.3 03/26/2020   PLT 167 03/26/2020   NEUTROABS 5.2 03/26/2020    CT SOFT TISSUE NECK W CONTRAST  Addendum Date: 03/11/2020   ADDENDUM REPORT: 03/11/2020 16:36 ADDENDUM: Study discussed by telephone with Dr. Jolaine Artist MEMON on 03/11/2020 at 1631 hours. Electronically Signed   By: Genevie Ann M.D.   On: 03/11/2020 16:36   Result Date: 03/11/2020 CLINICAL DATA:  59 year old female with dysphagia. Tonsillitis suspected. EXAM: CT NECK WITH CONTRAST TECHNIQUE: Multidetector CT imaging of the neck was performed using the standard protocol following the bolus administration of intravenous contrast. CONTRAST:  72mL OMNIPAQUE IOHEXOL 300 MG/ML  SOLN COMPARISON:  Chest CT 01/28/2019. FINDINGS: Pharynx and larynx: Bulky soft tissue mass throughout the bilateral supraglottic larynx and affecting the hypopharynx. Lobulated diffuse thickening of the epiglottis (series 2, image 53). Diffuse false cord and anterior commissure involvement with evidence of extension into the right paraglottic space on series 2 image 61 - where nodular direct extension of tumor and/or inseparable abnormal right level 3 lymph node protrudes on coronal image 58. Asymmetric erosion of  the undersurface of the right thyroid cartilage on image 65. Asymmetric sclerosis of the right arytenoid on image 67., and midline extension of tumor is suspected through the anterior commissure a distance of about 11 mm as seen on series 2, image 67 and sagittal image 48. All told, tumor size is estimated at 37 x 52 by 45 mm (AP by transverse by CC). There is evidence of airway compromise. The true cords may be spared as seen on series 2, image 71. Subglottic larynx is within normal limits. Above  the vallecula pharyngeal contours appear normal. Normal superior parapharyngeal and retropharyngeal spaces. Salivary glands: Negative sublingual space. Submandibular glands and parotid glands remain within normal limits. Thyroid: Negative. Lymph nodes: Malignant left level IIIb lymph node measures 17 mm short axis and 29 mm long axis (series 2, image 55 and coronal image 73). As stated above it is possible of malignant right level IIIa lymph node is inseparable from the parent tumor along the right paraglottic space (coronal image 58). Smaller but asymmetrically enlarged left level 4 nodes measure up to 9 mm short axis (coronal image 72). No other abnormal or suspicious lymph nodes identified. Vascular: Major vascular structures in the neck and at the skull base remain patent including both internal jugular veins. The left vertebral artery appears dominant. Calcified atherosclerosis at the skull base. Limited intracranial: Negative. Visualized orbits: Negative. Mastoids and visualized paranasal sinuses: Clear. Skeleton: Absent and carious dentition. Cervical spine degeneration. No suspicious osseous lesion identified. 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 No superior mediastinal lymphadenopathy. Visible axillary lymph nodes are normal. Mild dependent atelectasis.  No upper lung nodule identified. IMPRESSION: 1. 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 Recommend ENT consultation. Involvement of right laryngeal cartilages, with extension in the midline anterior to the strap muscles, and also extension through the right paraglottic space and/or inseparable malignant right level IIIa lymph node. Estimated tumor long axis 5.2 cm. 2. Malignant contralateral left level 3b lymph node is 2.9 cm long axis. Smaller indeterminate left level 4 lymph nodes. 3. No distant metastatic disease identified in the neck or upper chest. Electronically Signed: By: Genevie Ann M.D. On:  03/11/2020 16:23   IR GASTROSTOMY TUBE MOD SED  Result Date: 03/22/2020 INDICATION: Head and neck malignancy, dysphagia EXAM: FLUOROSCOPIC 16 FRENCH BALLOON RETENTION GASTROSTOMY MEDICATIONS: 1 G VANCOMYCIN; Antibiotics were administered within 1 hour of the procedure. GLUCAGON 0.5 MG IV ANESTHESIA/SEDATION: Versed 0 mg IV; Fentanyl 50 mcg IV Moderate Sedation Time:  12 MINUTES The patient was continuously monitored during the procedure by the interventional radiology nurse under my direct supervision. CONTRAST:  10 CC-administered into the gastric lumen. FLUOROSCOPY TIME:  Fluoroscopy Time: 1 minutes 36 seconds (53 mGy). COMPLICATIONS: None immediate. PROCEDURE: Informed written consent was obtained from the patient after a thorough discussion of the procedural risks, benefits and alternatives. All questions were addressed. Maximal Sterile Barrier Technique was utilized including caps, mask, sterile gowns, sterile gloves, sterile drape, hand hygiene and skin antiseptic. A timeout was performed prior to the initiation of the procedure. Previous imaging reviewed. Patient has an existing feeding tube within the stomach. This was utilized to insufflate the stomach with air. The stomach was localized beneath the left subcostal margin in the left abdomen with biplane fluoroscopy. Overlying skin marked. Under sterile conditions and local anesthesia, introducer needle was advanced into the stomach. Contrast injection confirms percutaneous needle access within the stomach. Single T tack deployed for gastropexy. Amplatz guidewire inserted. Tract dilatation performed  to insert the 40 French balloon retention gastrostomy. Balloon tip inflated with 7 cc mixture of saline and 1 cc contrast. This was retracted against the anterior gastric wall. Contrast injection confirms position in the stomach. Images obtained for documentation. T tacks secured to the skin site. A sterile dressing applied. No immediate complication.  Patient tolerated the procedure well. IMPRESSION: Successful fluoroscopic 50 French balloon retention gastrostomy Electronically Signed   By: Jerilynn Mages.  Shick M.D.   On: 03/22/2020 16:53   CT CHEST ABDOMEN PELVIS W CONTRAST  Result Date: 03/15/2020 CLINICAL DATA:  New diagnosis of head neck mass EXAM: CT CHEST, ABDOMEN, AND PELVIS WITH CONTRAST TECHNIQUE: Multidetector CT imaging of the chest, abdomen and pelvis was performed following the standard protocol during bolus administration of intravenous contrast. CONTRAST:  137mL OMNIPAQUE IOHEXOL 300 MG/ML  SOLN COMPARISON:  Prior swallowing evaluation and CT of the neck. FINDINGS: CT CHEST FINDINGS Cardiovascular: Calcified coronary artery disease. Aorta is normal caliber. 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 Central pulmonary vasculature is mildly engorged. Approximately 3 cm greatest caliber unchanged from previous exam. Unremarkable on limited venous phase assessment. Mediastinum/Nodes: Supraglottic mass partially imaged on the first image of the data set. Interval placement of tracheostomy tube with resultant pneumomediastinum in the anterior mediastinum in upper mediastinum. No adenopathy within the mediastinum or axilla. No hilar adenopathy. Rounded partial necrotic lymph node in the LEFT neck partially visualized, corresponding to level IIIb lymph node seen on the neck CT. Lungs/Pleura: Basilar consolidative changes bilaterally with similar pattern of consolidation though decreased volume loss when compared to the study of September of 2020. Airways are patent. Musculoskeletal: See below for full musculoskeletal detail. CT ABDOMEN PELVIS FINDINGS Hepatobiliary: Liver without focal lesion. Post cholecystectomy. No biliary duct dilation. Pancreas: Mild atrophy of the pancreas without focal lesion, ductal dilation or inflammation. Spleen: Spleen normal in size and contour. Adrenals/Urinary Tract: Adrenal glands are normal.  Symmetric renal enhancement. No hydronephrosis. LEFT renal cysts, largest arising from the lower pole measuring 2.8 x 2.9 cm. Urinary bladder under distended limiting assessment. Stomach/Bowel: Gastrointestinal tract with signs of colonic diverticulosis. No acute bowel process. Rectus diastasis. Postoperative changes in the midline of the abdomen related to prior ventral hernia repair. Mild bulging at the site of hernia repair, no herniation beyond the inserted mesh near the umbilicus. Vascular/Lymphatic: Calcified atheromatous plaque in the abdominal aorta. No aneurysmal dilation. There is no gastrohepatic or hepatoduodenal ligament lymphadenopathy. No retroperitoneal or mesenteric lymphadenopathy. No pelvic sidewall lymphadenopathy. Reproductive: No adnexal mass. Reproductive structures are unremarkable. Other: Post abdominal wall reconstruction with rectus diastasis, no frank hernia. Musculoskeletal: Spinal degenerative changes. Degenerative changes in glenohumeral joints and hips. Ununited fractures along posterior LEFT chest involving ribs 10 through 12. These appear subacute or chronic. These did not appear to be present on previous imaging from September of 2020. IMPRESSION: 1. Supraglottic mass partially imaged on the first image of the data set. No signs of metastatic disease to the chest, abdomen or pelvis. 2. Rounded partially necrotic lymph node in the LEFT neck partially visualized, corresponding to level IIIb lymph node seen on the neck CT. 3. Basilar consolidative changes bilaterally with similar pattern of consolidation though decreased volume loss when compared to the study of September of 2020. Findings may be related to aspiration. No definite lesions seen in the chest though these areas could obscure underlying lesions. 4. Postoperative changes with small amount of pneumomediastinum presumably related to recent tracheostomy tube insertion. 5. Ununited fractures along  posterior LEFT chest  involving ribs 10 through 12. These appear subacute or chronic. These did not appear to be present on previous imaging from September of 2020. 6. Post abdominal wall reconstruction with rectus diastasis, no frank hernia. 7. Aortic atherosclerosis. Aortic Atherosclerosis (ICD10-I70.0). Electronically Signed   By: Zetta Bills M.D.   On: 03/15/2020 10:53   DG CHEST PORT 1 VIEW  Result Date: 03/25/2020 CLINICAL DATA:  Hypoxia, shortness of breath, altered mental status history COPD, hypertension, history laryngeal carcinoma, tracheostomy, undergoing radiation therapy EXAM: PORTABLE CHEST 1 VIEW COMPARISON:  Portable exam 0844 hours compared to 03/23/2020 FINDINGS: Tracheostomy tube projects over tracheal air column. Normal heart size, mediastinal contours, and pulmonary vascularity. Improving opacity at LEFT lung base which could represent atelectasis or consolidation. Subsegmental atelectasis RIGHT middle lobe slightly improved. Tiny LEFT pleural effusion. Remaining lungs clear. No pneumothorax. Bones demineralized. IMPRESSION: Persistent subsegmental atelectasis at RIGHT base with improving atelectasis versus consolidation and tiny pleural effusion at LEFT base. Electronically Signed   By: Lavonia Dana M.D.   On: 03/25/2020 10:33   DG CHEST PORT 1 VIEW  Result Date: 03/23/2020 CLINICAL DATA:  Hypoxia. EXAM: PORTABLE CHEST 1 VIEW COMPARISON:  Left 19 2021. FINDINGS: Interim removal of feeding tube. Tracheostomy tube in stable position. Cardiomegaly. Persistent bibasilar atelectasis/infiltrates and small bilateral pleural effusions. Density noted over the right mid chest may represent atelectasis and or pleural fluid pseudotumor, no interim change. IMPRESSION: 1. Interim removal of feeding tube. Tracheostomy tube in stable position. 2. Stable cardiomegaly. 3. Persistent bibasilar atelectasis/infiltrates and small bilateral pleural effusions. Density of the right mid chest may represent atelectasis and or  pleural fluid pseudotumor, no interim change. Electronically Signed   By: Marcello Moores  Register   On: 03/23/2020 06:39   DG CHEST PORT 1 VIEW  Result Date: 03/19/2020 CLINICAL DATA:  Pneumonia. EXAM: PORTABLE CHEST 1 VIEW COMPARISON:  03/18/2020. FINDINGS: Tracheostomy tube, feeding tube in stable position. Stable cardiomegaly. Improved pulmonary venous congestion. Lung volumes. Persistent left base atelectasis/infiltrate. Persistent right base subsegmental atelectasis. Elliptical density noted over the right mid chest may represent atelectasis and or fissural fluid. Pleural effusions cannot be excluded. No pneumothorax. IMPRESSION: 1. Lines and tubes in stable position. 2. Stable cardiomegaly. Improved pulmonary venous congestion. 3. Persistent left base atelectasis/infiltrate. Persistent right base subsegmental atelectasis. Elliptical density noted over the right mid chest may represent atelectasis and or fissural fluid. Small pleural effusions cannot be excluded. Electronically Signed   By: Marcello Moores  Register   On: 03/19/2020 06:03   DG Chest Port 1 View  Result Date: 03/18/2020 CLINICAL DATA:  Respiratory failure. EXAM: PORTABLE CHEST 1 VIEW COMPARISON:  03/17/2020. FINDINGS: Tracheostomy tube and feeding tube in stable position. Cardiomegaly. Mild pulmonary venous congestion. Mild bilateral interstitial prominence. Mild component of CHF cannot be excluded. Persistent left lower lobe atelectasis/infiltrate. New onset right mid lung prominent atelectatic changes. No pneumothorax. IMPRESSION: 1. Tracheostomy tube and feeding tube in stable position. 2. Cardiomegaly with mild pulmonary venous congestion and mild bilateral interstitial prominence. Mild component of CHF cannot be excluded. 3. Persistent left lower lobe atelectasis/infiltrate. New onset of right mid lung prominent atelectatic changes. Electronically Signed   By: Marcello Moores  Register   On: 03/18/2020 07:34   DG CHEST PORT 1 VIEW  Result Date:  03/17/2020 CLINICAL DATA:  Respiratory failure EXAM: PORTABLE CHEST 1 VIEW COMPARISON:  March 14, 2020 FINDINGS: Tracheostomy catheter tip is 6.2 cm above the carina. Feeding tube tip is below the diaphragm. No pneumothorax. There is a  small left pleural effusion with consolidation in the left lower lobe. There is right base atelectasis with equivocal small right pleural effusion. Heart is mildly enlarged, stable, with pulmonary vascularity normal. No adenopathy. No bone lesions. IMPRESSION: Tube positions as described without pneumothorax. Left pleural effusion with equivocal right pleural effusion. Airspace opacity consistent with combination of atelectasis and pneumonia left lower lobe. Mild atelectasis right base. Stable cardiac prominence. Electronically Signed   By: Lowella Grip III M.D.   On: 03/17/2020 09:02   DG CHEST PORT 1 VIEW  Result Date: 03/14/2020 CLINICAL DATA:  Respiratory failure with hypercapnia EXAM: PORTABLE CHEST 1 VIEW COMPARISON:  March 10, 2020 FINDINGS: Tracheostomy catheter tip is 6.2 cm above the carina. There is ill-defined airspace opacity in the left upper lobe and left base regions with equivocal left pleural effusion. The right lung is clear. Heart is upper normal in size with pulmonary vascularity normal. No adenopathy. No bone lesions. IMPRESSION: Tracheostomy as described without pneumothorax. Patchy airspace opacity consistent with scattered areas of pneumonia in the left upper lobe and left base. Equivocal left pleural effusion. Right lung clear. Stable cardiac silhouette. Electronically Signed   By: Lowella Grip III M.D.   On: 03/14/2020 09:17   DG Chest Portable 1 View  Result Date: 03/10/2020 CLINICAL DATA:  Shortness of breath and cough EXAM: PORTABLE CHEST 1 VIEW COMPARISON:  January 23, 2020 FINDINGS: There is bibasilar atelectatic change. Elsewhere the interstitium is mildly thickened. No consolidation. Heart size and pulmonary vascularity  are normal. No adenopathy. There is degenerative change in each shoulder. IMPRESSION: Bibasilar atelectasis. Interstitial thickening likely represents a degree of underlying chronic bronchitis. No edema or airspace opacity. Heart size within normal limits. Electronically Signed   By: Lowella Grip III M.D.   On: 03/10/2020 12:42   DG Swallowing Func-Speech Pathology  Result Date: 03/11/2020 Objective Swallowing Evaluation: Type of Study: MBS-Modified Barium Swallow Study  Patient Details Name: AZARIE CORIZ MRN: [id number] Date of Birth: August 09, 1960 Today's Date: 03/11/2020 Time: SLP Start Time (ACUTE ONLY): 7096 -SLP Stop Time (ACUTE ONLY): 1316 SLP Time Calculation (min) (ACUTE ONLY): 21 min Past Medical History: Past Medical History: Diagnosis Date . Bronchitis  . Class 3 obesity 01/23/2020 . COPD (chronic obstructive pulmonary disease) ([place])  . Degenerative disc disease, lumbar  . Hiatal hernia  . Hypertension  Past Surgical History: Past Surgical History: Procedure Laterality Date . CHOLECYSTECTOMY   . degenerative bone disease   . INCISIONAL HERNIA REPAIR N/A 01/15/2019  Procedure: [name] HERNIORRHAPHY WITH MESH;  Surgeon: [name], MD;  Location: [facility];  Service: General;  Laterality: N/A; . OMENTECTOMY N/A 01/15/2019  Procedure: OMENTECTOMY;  Surgeon: [name], MD;  Location: [facility];  Service: General;  Laterality: N/A; HPI: Veronica Horton  is a 58 y.o. female, with medical history significant of class III obesity, hiatal hernia, hypertension, bronchitis, COPD, chronic respiratory failure on home oxygen at 5 LPM, active smoker of 1-1/2 packs of cigarettes per day , patient presents to ED secondary to shortness of breath and altered mental status, patient with receptive worsening dyspnea over the last 4 days, she is not vaccinated against Covid, she is altered at the time of my examination, history was obtained from boyfriend at bedside, and ED staff, patient still smoking, she still using  5 L of oxygen, he is with increased work of breathing, significantly dyspneic, upon presentation to ED she is altered not provide any complaints at this point. BSE requested.  Subjective: "I haven't been  able to swallow my pills for a couple weeks." Assessment / Plan / Recommendation CHL IP CLINICAL IMPRESSIONS 03/11/2020 Clinical Impression Pt presents with mild pharyngeal phase dysphagia, however appearance of anatomy appears edematous (epiglottis and aryepiglottic folds). Pt with limited dentition and requires extra time to masticate solids, swallow trigger generally at the level of the valleculae, Pt with blunted appearance of epiglottis with reduced deflection resulting in variable trace, flash penetration of thins during the swallow without aspiration and min vallecular residue with solids and brief stasis of barium tablet in valleculae. Pt with prominent cricopharyngeus. Recommend D3/mech soft and thin liquids, po medications whole in puree and follow with liquid wash or per Pt preference. Also strongly recommend additional imaging (neck CT) and ENT consult pending those results. SLP will follow pending results of imaging. Above to RN and MD.  SLP Visit Diagnosis Dysphagia, oropharyngeal phase (R13.12) Attention and concentration deficit following -- Frontal lobe and executive function deficit following -- Impact on safety and function Mild aspiration risk   CHL IP TREATMENT RECOMMENDATION 03/11/2020 Treatment Recommendations No treatment recommended at this time   Prognosis 03/11/2020 Prognosis for Safe Diet Advancement Fair Barriers to Reach Goals Severity of deficits Barriers/Prognosis Comment -- CHL IP DIET RECOMMENDATION 03/11/2020 SLP Diet Recommendations Dysphagia 3 (Mech soft) solids;Thin liquid Liquid Administration via Cup;Straw Medication Administration Whole meds with puree Compensations Slow rate;Small sips/bites Postural Changes Remain semi-upright after after feeds/meals (Comment);Seated  upright at 90 degrees   CHL IP OTHER RECOMMENDATIONS 03/11/2020 Recommended Consults Consider ENT evaluation Oral Care Recommendations Oral care BID Other Recommendations Clarify dietary restrictions   CHL IP FOLLOW UP RECOMMENDATIONS 03/11/2020 Follow up Recommendations None   CHL IP FREQUENCY AND DURATION 03/11/2020 Speech Therapy Frequency (ACUTE ONLY) min 2x/week Treatment Duration 1 week      CHL IP ORAL PHASE 03/11/2020 Oral Phase WFL Oral - Pudding Teaspoon -- Oral - Pudding Cup -- Oral - Honey Teaspoon -- Oral - Honey Cup -- Oral - Nectar Teaspoon -- Oral - Nectar Cup -- Oral - Nectar Straw -- Oral - Thin Teaspoon -- Oral - Thin Cup -- Oral - Thin Straw -- Oral - Puree -- Oral - Mech Soft -- Oral - Regular -- Oral - Multi-Consistency -- Oral - Pill -- Oral Phase - Comment --  CHL IP PHARYNGEAL PHASE 03/11/2020 Pharyngeal Phase Impaired Pharyngeal- Pudding Teaspoon -- Pharyngeal -- Pharyngeal- Pudding Cup -- Pharyngeal -- Pharyngeal- Honey Teaspoon -- Pharyngeal -- Pharyngeal- Honey Cup -- Pharyngeal -- Pharyngeal- Nectar Teaspoon -- Pharyngeal -- Pharyngeal- Nectar Cup -- Pharyngeal -- Pharyngeal- Nectar Straw Pharyngeal residue - valleculae;Reduced epiglottic inversion Pharyngeal -- Pharyngeal- Thin Teaspoon Delayed swallow initiation-vallecula;Reduced epiglottic inversion Pharyngeal -- Pharyngeal- Thin Cup Reduced epiglottic inversion;Penetration/Aspiration during swallow Pharyngeal Material enters airway, remains ABOVE vocal cords then ejected out Pharyngeal- Thin Straw Reduced epiglottic inversion;Penetration/Aspiration during swallow Pharyngeal Material enters airway, remains ABOVE vocal cords then ejected out Pharyngeal- Puree WFL Pharyngeal -- Pharyngeal- Mechanical Soft -- Pharyngeal -- Pharyngeal- Regular Pharyngeal residue - valleculae;Reduced epiglottic inversion Pharyngeal -- Pharyngeal- Multi-consistency -- Pharyngeal -- Pharyngeal- Pill Pharyngeal residue - valleculae;Reduced epiglottic  inversion Pharyngeal -- Pharyngeal Comment edematous pharynx  CHL IP CERVICAL ESOPHAGEAL PHASE 03/11/2020 Cervical Esophageal Phase Impaired Pudding Teaspoon -- Pudding Cup -- Honey Teaspoon -- Honey Cup -- Nectar Teaspoon -- Nectar Cup -- Nectar Straw -- Thin Teaspoon -- Thin Cup Prominent cricopharyngeal segment Thin Straw -- Puree -- Mechanical Soft -- Regular -- Multi-consistency -- Pill -- Cervical Esophageal Comment -- Thank you, [name], CCC-SLP  [phone] PORTER,DABNEY 03/11/2020, 2:12 PM              ECHOCARDIOGRAM COMPLETE  Result Date: 03/11/2020    ECHOCARDIOGRAM REPORT   Patient Name:   Veronica Horton Date of Exam: 03/11/2020 Medical Rec #:  [id number]       Height:       65.0 in Accession #:    [id number]      Weight:       244.0 lb Date of Birth:  05-Jul-1960       BSA:          2.153 m Patient Age:    59 years        BP:           115/35 mmHg Patient Gender: F               HR:           57 bpm. Exam Location:  [facility] Procedure: 2D Echo Indications:    Dyspnea 786.09 / R06.00  History:        Patient has prior history of Echocardiogram examinations, most                 recent 01/16/2019. COPD; Risk Factors:Hypertension and Current                 Smoker. Acute respiratory failure with hypoxia.  Sonographer:    Leavy Cella RDCS (AE) Referring Phys: [id number] DAWOOD S ELGERGAWY IMPRESSIONS  1. Left ventricular ejection fraction, by estimation, is 70 to 75%. The left ventricle has hyperdynamic function. The left ventricle has no regional wall motion abnormalities. There is mild left ventricular hypertrophy. Left ventricular diastolic parameters were normal.  2. Right ventricular systolic function is normal. The right ventricular size is normal. Tricuspid regurgitation signal is inadequate for assessing PA pressure.  3. The mitral valve is grossly normal. Trivial mitral valve regurgitation.  4. The aortic valve is tricuspid. Aortic valve regurgitation is not visualized.  5. The inferior  vena cava is normal in size with greater than 50% respiratory variability, suggesting right atrial pressure of 3 mmHg. FINDINGS  Left Ventricle: Left ventricular ejection fraction, by estimation, is 70 to 75%. The left ventricle has hyperdynamic function. The left ventricle has no regional wall motion abnormalities. The left ventricular internal cavity size was normal in size. There is mild left ventricular hypertrophy. Left ventricular diastolic parameters were normal. Right Ventricle: The right ventricular size is normal. No increase in right ventricular wall thickness. Right ventricular systolic function is normal. Tricuspid regurgitation signal is inadequate for assessing PA pressure. Left Atrium: Left atrial size was normal in size. Right Atrium: Right atrial size was normal in size. Pericardium: There is no evidence of pericardial effusion. Mitral Valve: The mitral valve is grossly normal. Trivial mitral valve regurgitation. Tricuspid Valve: The tricuspid valve is grossly normal. Tricuspid valve regurgitation is trivial. Aortic Valve: The aortic valve is tricuspid. Aortic valve regurgitation is not visualized. Pulmonic Valve: The pulmonic valve was not well visualized. Pulmonic valve regurgitation is not visualized. Aorta: The aortic root is normal in size and structure. Venous: The inferior vena cava is normal in size with greater than 50% respiratory variability, suggesting right atrial pressure of 3 mmHg. IAS/Shunts: No atrial level shunt detected by color flow Doppler.  LEFT VENTRICLE PLAX 2D LVIDd:         4.05 cm  Diastology LVIDs:         2.30 cm  LV  e' medial:    8.59 cm/s LV PW:         1.35 cm  LV E/e' medial:  12.2 LV IVS:        1.34 cm  LV e' lateral:   11.00 cm/s LVOT diam:     2.00 cm  LV E/e' lateral: 9.5 LVOT Area:     3.14 cm  RIGHT VENTRICLE RV S prime:     15.40 cm/s TAPSE (M-mode): 2.9 cm LEFT ATRIUM             Index       RIGHT ATRIUM           Index LA diam:        4.70 cm 2.18 cm/m   RA Area:     13.50 cm LA Vol (A2C):   73.3 ml 34.05 ml/m RA Volume:   35.40 ml  16.44 ml/m LA Vol (A4C):   39.3 ml 18.26 ml/m LA Biplane Vol: 55.6 ml 25.83 ml/m   AORTA Ao Root diam: 2.70 cm MITRAL VALVE MV Area (PHT): 2.69 cm     SHUNTS MV Decel Time: 282 msec     Systemic Diam: 2.00 cm MV E velocity: 105.00 cm/s MV A velocity: 57.80 cm/s MV E/A ratio:  1.82 Rozann Lesches MD Electronically signed by Rozann Lesches MD Signature Date/Time: 03/11/2020/4:59:31 PM    Final     ASSESSMENT AND PLAN: 59 year old Caucasian female, with medical history of heavy smoking, COPD on home oxygen 5 L/min, hypertension, presents with worsening dyspnea and altered mental status.  Work-up showed a large laryngeal mass.  Patient required urgent tracheostomy.  1.  Supraglottic laryngeal cancer, [id number] 2. COPD with acute exacerbation, on chronic home oxygen 5 L/min 3. HTN  4.  Heavy smoking history  Recommendations:  She has T4aN2c supraglottic laryngeal cancer, with compromised airway, required urgent tracheostomy.  -For staging scan, we ideally would like to get a PET scan.  However this is not feasible while she is in hospital, and there is quite bit social difficult to get her treated as outpt (she lives an hour away and has no transportation). CT C/A/P performed did not show evidence of distant mets.  - Ideally for T4a tumors, total laryngectomy with bilateral neck dissection is the way to go but patient refused surgery. I did explain to her if patient declines surgery, we can proceed with concurrent chemo radiation although it is likely that she may need salvage surgery if she has incomplete response. Given baseline PS, she is not an ideal candidate for every 21 day cisplatin, hence we will proceed with weekly cisplatin. Informed consent obtained. I have discussed adverse effects from cisplatin including but not limited to fatigue, nausea, vomiting, increased risk of infections which can be fatal  occasionally, ototoxicity resulting in permanent hearing loss at times, nephrotoxicity which can occasionally be permanent. She understands that the treatment can be tough and life threatening rarely but she agrees to proceed PEG for feeding. Anticipate CRT start on Monday. No contraindications to chemo from my review today, GFR greater than 60 today. Disposition plan, yet to be determined, Benay Pike, MD

## 2020-03-26 NOTE — Progress Notes (Signed)
Requested and obtained MBS order given pt's trach placement and secretions with new laryngeal mass diagnosis.  Currently pt has PEG for nutrition and is unable to use PMSV due to laryngeal mass.     Will plan MBS tomorrow 03/27/2020 to determine pt's appropriateness for po.  In the interim, recommend continue ice chips after oral care.  Thank you!  [name], MS [facility] SLP Acute Rehab Services Office [phone] Pager [phone]

## 2020-03-26 NOTE — Progress Notes (Signed)
PROGRESS NOTE    Veronica Horton  UXN:[id number] DOB: 04-14-1961 DOA: 03/10/2020 PCP: [facility]   Chief Complaint  Patient presents with  . Shortness of Breath   Brief Narrative: Patient is Veronica Horton 59 year old female with history of morbid obesity, hiatal hernia, hypertension, COPD, chronic respiratory failure on home oxygen at 5 L/min, smoker who presents to the emergency room  On 03/10/20 with complaint of shortness of breath, altered mental status.  On presentation ABG showed PCO2 of 92 and she had to be kept on BiPAP.  She was found to have laryngeal mass concerning for malignancy and she underwent biopsy which showed invasive squamous  cell carcinoma and she required tracheostomy.  She had to be transferred to ICU post biopsy and had to be kept on ventilator support.  She was transferred to [facility] on 11/17 for radiation therapy.  ransfer to [facility] service from [name] service on 03/20/2020. Underwent  PEG placement  by IR on 03/22/2020. Plan is to start concurrent chemo radiation during this hospitalization while seeking placement in CIR.  Oncology/radiation oncology following.  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  Found to have airway obstruction/compromise due to laryngeal mass.  CT soft tissue neck on 11/11 showed bulky bilateral supraglottic tumor with possible airway compromise, involvement of right laryngeal cartilages.  ENT was consulted.  She underwent laryngeal biopsy which  showed squamous cell carcinoma.   Status post tracheostomy Underwent PEG placement by IR on 11/22. CT chest/abdomen/pelvis did not show signs of metastatic disease. She was recommended total laryngectomy with bilateral neck dissection but she declined surgery.  Plan is to start concurrent chemoradiation during this hospitalization.  Oncology/radiation oncology following.  Per oncology, anticipate CRT Monday.  Acute on chronic hypoxic/hypercarbic respiratory failure: History of COPD, on oxygen at home at 5 L/min.  Chronic respiratory failure exacerbated by laryngeal squamous cell carcinoma, COPD exacerbation, pneumonia.   Hospital course was remarkable for Serratia pneumonia.  Finished the course of antibiotics with levofloxacin.  Currently on trach collar, currently requiring 10 L by trach collar, intermittently desatting to mid 80's, fluctuating into the 90's Repeat CXR 11/25 with persistent subsegmental atelectasis at R base with improving atelectasis vs consolidation and tiny effusion at L base  COPD exacerbation: Being treated with steroids, bronchodilators.  Continue pulmonary hygiene, ventilator support at night, trach collar during the day.  Follow intermittent chest x-ray.  Aspiration precautions.  Steroids off.  No wheezing appreciated.  Hypoglycemia: resolved with tube feeds, continue to monitor  GERD: Continue PPI  Hypertension: Monitor blood pressure.  Off antihypertensives which she was at home.  Blood pressure okay.  AKI: Resolved  Thrombocytopenia: resolved.  Disposition: PT/OT recommending CIR.  She denies skilled nursing facility placement.  Plan is to start on concurrent chemoradiation during this hospitalization.  Awaiting improvement in oxygen needs prior to CIR.  Nutrition Problem: Increased nutrient needs Etiology: cancer and cancer related treatments, chronic illness  DVT prophylaxis:lovenox Code Status:full  Family Communication: [name] 11/24 - called  11/25,  no answer Disposition:   Status is: Inpatient  Remains inpatient appropriate because:Inpatient level of care appropriate due to severity of illness   Dispo: The patient is from: Home              Anticipated d/c is to: CIR              Anticipated d/c date is: > 3 days              Patient currently is not medically stable to d/c.       Consultants:   PCCM  Rad onc  Oncology  ENT  Procedures:  tracheostomy  Echo IMPRESSIONS    1. Left ventricular ejection fraction, by estimation, is 70 to 75%. The  left ventricle has hyperdynamic function. The left ventricle has no  regional wall motion abnormalities. There is mild left ventricular  hypertrophy. Left ventricular diastolic  parameters were normal.  2. Right ventricular systolic function is normal. The right ventricular  size is normal. Tricuspid regurgitation signal is inadequate for assessing  PA pressure.  3. The mitral valve is grossly normal. Trivial mitral valve  regurgitation.  4. The aortic valve is tricuspid. Aortic valve regurgitation is not  visualized.  5. The inferior vena cava is normal in size with greater than 50%  respiratory variability, suggesting right atrial pressure of 3 mmHg.  Antimicrobials:  Anti-infectives (From admission, onward)   Start     Dose/Rate Route Frequency Ordered Stop   03/22/20 1700  vancomycin (VANCOCIN) IVPB 1000 mg/200 mL premix  Status:  Discontinued        1,000 mg 200 mL/hr over 60 Minutes Intravenous  Once 03/22/20 1605 03/23/20 0236   03/22/20 1604  vancomycin (VANCOCIN) 1-5 GM/200ML-% IVPB       Note to Pharmacy: [name]   : cabinet override      03/22/20 1604 03/22/20 1615   03/14/20 1100  levofloxacin (LEVAQUIN) IVPB 750 mg        750 mg 100 mL/hr over 90 Minutes Intravenous Daily 03/14/20 0955 03/19/20 1146   03/10/20 1730  doxycycline (VIBRAMYCIN) 100 mg in sodium chloride 0.9 % 250 mL IVPB  Status:  Discontinued        100 mg 125 mL/hr  over 120 Minutes Intravenous Every 12 hours 03/10/20 1634 03/14/20 0955   03/10/20 1600  azithromycin (ZITHROMAX) 500 mg in sodium chloride 0.9 % 250 mL IVPB  Status:  Discontinued        500 mg 250 mL/hr over 60 Minutes Intravenous Every 24 hours 03/10/20 1529 03/10/20 1538   03/10/20 1500  clindamycin (CLEOCIN) IVPB 600 mg  Status:  Discontinued        600 mg 100 mL/hr over 30 Minutes Intravenous  Once 03/10/20 1458 03/10/20 1521         Subjective: Just c/o soreness from chair No other complaints  Objective: Vitals:   03/26/20 0030 03/26/20 0313 03/26/20 0742 03/26/20 1159  BP:    (!) 107/59  Pulse: 73 (!) 58  64  Resp: 18 18  18   Temp:    97.9 F (36.6 C)  TempSrc:      SpO2: 92% 92% 92% 94%  Weight:      Height:        Intake/Output Summary (Last 24 hours) at 03/26/2020 1456 Last data filed at 03/25/2020 2140 Gross per 24 hour  Intake --  Output 400 ml  Net -400 ml   Filed Weights   03/18/20 0500  03/23/20 0500 03/24/20 0500  Weight: 112.5 kg 111.8 kg 111.5 kg    Examination:  General: No acute distress. Cardiovascular: Heart sounds show Aydrian Halpin regular rate, and rhythm Lungs: coarse breath sounds bilatearlly. Trach. Abdomen: Soft, nontender, nondistended.  Peg. Neurological: Alert and oriented 3. Moves all extremities 4. Cranial nerves II through XII grossly intact. Skin: Warm and dry. No rashes or lesions. Extremities: No clubbing or cyanosis. No edema. Data Reviewed: I have personally reviewed following labs and imaging studies  CBC: Recent Labs  Lab 03/20/20 0249 03/23/20 0301 03/24/20 0748 03/25/20 0243 03/26/20 0535  WBC 11.8* 13.4* 10.0 9.7 7.6  NEUTROABS  --  10.6* 7.4 7.3 5.2  HGB 14.8 13.9 13.1 13.3 12.9  HCT 47.6* 44.2 40.6 40.9 40.8  MCV 101.3* 100.0 97.8 96.9 99.3  PLT 130* 163 174 159 254    Basic Metabolic Panel: Recent Labs  Lab 03/20/20 0249 03/23/20 0301 03/24/20 0748 03/25/20 0243 03/26/20 0535  NA 141 139 137 138 137  K  4.6 4.4 3.8 3.9 4.0  CL 104 107 105 105 104  CO2 27 23 22 23 25   GLUCOSE 86 81 134* 115* 141*  BUN 31* 29* 28* 25* 22*  CREATININE 0.91 0.81 0.96 0.84 0.90  CALCIUM 8.4* 8.4* 8.3* 8.4* 8.4*  MG 2.7*  --   --  2.4 2.5*  PHOS 4.5  --   --  4.6 4.5    GFR: Estimated Creatinine Clearance: 82.7 mL/min (by C-G formula based on SCr of 0.9 mg/dL).  Liver Function Tests: Recent Labs  Lab 03/24/20 0748 03/25/20 0243 03/26/20 0535  AST 14* 14* 15  ALT 19 17 17   ALKPHOS 49 54 52  BILITOT 0.9 0.6 0.7  PROT 5.3* 5.4* 5.3*  ALBUMIN 2.8* 2.8* 2.7*    CBG: Recent Labs  Lab 03/25/20 2016 03/26/20 0008 03/26/20 0417 03/26/20 0725 03/26/20 1201  GLUCAP 93 75 103* 123* 95     Recent Results (from the past 240 hour(s))  Culture, respiratory (non-expectorated)     Status: None   Collection Time: 03/21/20  4:25 PM   Specimen: Tracheal Aspirate; Respiratory  Result Value Ref Range Status   Specimen Description   Final    TRACHEAL ASPIRATE Performed at [facility] [address], [place], [place] [address]    Special Requests   Final    NONE Performed at [facility], [place] [address], [name], [place] [address]    Gram Stain   Final    FEW WBC PRESENT, PREDOMINANTLY PMN FEW GRAM POSITIVE COCCI IN PAIRS IN CLUSTERS RARE GRAM NEGATIVE RODS    Culture   Final    Normal respiratory flora-no Staph aureus or Pseudomonas seen Performed at [facility], [address] [place], [place] [address]    Report Status 03/23/2020 FINAL  Final         Radiology Studies: DG CHEST PORT 1 VIEW  Result Date: 03/25/2020 CLINICAL DATA:  Hypoxia, shortness of breath, altered mental status history COPD, hypertension, history laryngeal carcinoma, tracheostomy, undergoing radiation therapy EXAM: PORTABLE CHEST 1 VIEW COMPARISON:  Portable exam 0844 hours compared to 03/23/2020 FINDINGS: Tracheostomy tube projects over tracheal air column. Normal heart  size, mediastinal contours, and pulmonary vascularity. Improving opacity at LEFT lung base which could represent atelectasis or consolidation. Subsegmental atelectasis RIGHT middle lobe slightly improved. Tiny LEFT pleural effusion. Remaining lungs clear. No pneumothorax. Bones demineralized. IMPRESSION: Persistent subsegmental atelectasis at RIGHT base with improving atelectasis versus  consolidation and tiny pleural effusion at LEFT base. Electronically Signed   By: Lavonia Dana M.D.   On: 03/25/2020 10:33        Scheduled Meds: . arformoterol  15 mcg Nebulization BID  . budesonide (PULMICORT) nebulizer solution  0.5 mg Nebulization BID  . chlorhexidine  15 mL Mouth Rinse BID  . Chlorhexidine Gluconate Cloth  6 each Topical Daily  . docusate  100 mg Per Tube BID  . enoxaparin (LOVENOX) injection  55 mg Subcutaneous Q24H  . insulin aspart  0-9 Units Subcutaneous Q4H  . ipratropium-albuterol  3 mL Nebulization TID  . mouth rinse  15 mL Mouth Rinse q12n4p  . multivitamin with minerals  1 tablet Per Tube Daily  . nicotine  21 mg Transdermal Daily  . pantoprazole sodium  40 mg Per Tube QHS   Continuous Infusions: . sodium chloride    . feeding supplement (OSMOLITE 1.5 CAL) 60 mL/hr at 03/25/20 0600     LOS: 16 days    Time spent: over 30 min    Fayrene Helper, MD [facility]   To contact the attending provider between 7A-7P or the covering provider during after hours 7P-7A, please log into the web site [email or web address] and access using universal [place] password for that web site. If you do not have the password, please call the hospital operator.  03/26/2020, 2:56 PM

## 2020-03-27 ENCOUNTER — Inpatient Hospital Stay (HOSPITAL_COMMUNITY): Payer: Medicaid Other

## 2020-03-27 LAB — CBC WITH DIFFERENTIAL/PLATELET
Abs Immature Granulocytes: 0.05 10*3/uL (ref 0.00–0.07)
Basophils Absolute: 0 10*3/uL (ref 0.0–0.1)
Basophils Relative: 0 %
Eosinophils Absolute: 0.2 10*3/uL (ref 0.0–0.5)
Eosinophils Relative: 2 %
HCT: 40 % (ref 36.0–46.0)
Hemoglobin: 13 g/dL (ref 12.0–15.0)
Immature Granulocytes: 1 %
Lymphocytes Relative: 15 %
Lymphs Abs: 1.3 10*3/uL (ref 0.7–4.0)
MCH: 31.9 pg (ref 26.0–34.0)
MCHC: 32.5 g/dL (ref 30.0–36.0)
MCV: 98.3 fL (ref 80.0–100.0)
Monocytes Absolute: 0.6 10*3/uL (ref 0.1–1.0)
Monocytes Relative: 7 %
Neutro Abs: 6.4 10*3/uL (ref 1.7–7.7)
Neutrophils Relative %: 75 %
Platelets: 158 10*3/uL (ref 150–400)
RBC: 4.07 MIL/uL (ref 3.87–5.11)
RDW: 15.2 % (ref 11.5–15.5)
WBC: 8.5 10*3/uL (ref 4.0–10.5)
nRBC: 0 % (ref 0.0–0.2)

## 2020-03-27 LAB — PHOSPHORUS: Phosphorus: 3.5 mg/dL (ref 2.5–4.6)

## 2020-03-27 LAB — COMPREHENSIVE METABOLIC PANEL
ALT: 15 U/L (ref 0–44)
AST: 14 U/L — ABNORMAL LOW (ref 15–41)
Albumin: 2.7 g/dL — ABNORMAL LOW (ref 3.5–5.0)
Alkaline Phosphatase: 55 U/L (ref 38–126)
Anion gap: 8 (ref 5–15)
BUN: 17 mg/dL (ref 6–20)
CO2: 26 mmol/L (ref 22–32)
Calcium: 8.5 mg/dL — ABNORMAL LOW (ref 8.9–10.3)
Chloride: 106 mmol/L (ref 98–111)
Creatinine, Ser: 0.88 mg/dL (ref 0.44–1.00)
GFR, Estimated: >60 mL/min (ref >60)
Glucose, Bld: 137 mg/dL — ABNORMAL HIGH (ref 70–99)
Potassium: 4.4 mmol/L (ref 3.5–5.1)
Sodium: 140 mmol/L (ref 135–145)
Total Bilirubin: 0.5 mg/dL (ref 0.3–1.2)
Total Protein: 5.5 g/dL — ABNORMAL LOW (ref 6.5–8.1)

## 2020-03-27 LAB — MAGNESIUM: Magnesium: 2.4 mg/dL (ref 1.7–2.4)

## 2020-03-27 LAB — GLUCOSE, CAPILLARY
Glucose-Capillary: 104 mg/dL — ABNORMAL HIGH (ref 70–99)
Glucose-Capillary: 114 mg/dL — ABNORMAL HIGH (ref 70–99)
Glucose-Capillary: 122 mg/dL — ABNORMAL HIGH (ref 70–99)
Glucose-Capillary: 113 mg/dL — ABNORMAL HIGH (ref 70–99)
Glucose-Capillary: 105 mg/dL — ABNORMAL HIGH (ref 70–99)

## 2020-03-27 MED ORDER — FENTANYL CITRATE (PF) 100 MCG/2ML IJ SOLN
25.0000 ug | INTRAMUSCULAR | Status: DC | PRN
  Administered 2020-03-27 – 2020-03-29 (×2): 25 ug via INTRAVENOUS
  Filled 2020-03-27 (×2): qty 2

## 2020-03-27 MED ORDER — SCOPOLAMINE 1 MG/3DAYS TD PT72
1.0000 | MEDICATED_PATCH | TRANSDERMAL | Status: DC
  Administered 2020-03-28 – 2020-04-15 (×7): 1.5 mg via TRANSDERMAL
  Filled 2020-03-27 (×7): qty 1

## 2020-03-27 NOTE — Progress Notes (Signed)
  Speech Language Pathology Treatment: Dysphagia  Patient Details Name: Veronica Horton MRN: [id number] DOB: 07/20/1960 Today's Date: 03/27/2020 Time: 8657-8469 SLP Time Calculation (min) (ACUTE ONLY): 20 min  Assessment / Plan / Recommendation Clinical Impression  Pt seen to review MBS, diet recommendations, compensation strategies importance and to measure mandibular ROM.  SLP showed pt her MBS study demonstrating reasoning for following solids with liquids.  Using teach back, she is able to state precautions.  Measurement of mandibular ROM is 50 mm which is normal at this time.  Pt demonstrated precautions with intake of soda and applesauce.    Reviewed possible AAC with pt to use on a smart phone or tablet and after review, she states she is fine with writing.  Advised her to consider putting flags on her notebook to allow repeat usage of repetitive written statements.    Will follow up next week to initiate swallowing exercises for pt to begin as soon as any dysphagia is present *per research prophylactic exercises may not be beneficial.  Importance of continuing po during treatment reviewed per literature to decrease disuse muscle atrophy and help combat fibrosis.  Pt educated to all above and agreeable to plan.   HPI HPI: Veronica Horton is a 59 y/o F with history of COPD,chronic respiratory failure on home oxygen at 5 L/min, with large laryngeal mass esion, now s/p tracheostomy with Shiley 6-0 and direct laryngoscopy and biopsy. 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  Pt is also s/p PEG for nutrition.  Plans are for chemo and radiation concurrently starting while in hospital.  Prior medical history includes smoking and some dyspnea if walking long distanced.  Pt underwent MBS today and follow up completed to educate her to recommendations.  SLP showed pt her MBS study during testing and observed pt to po trials.      SLP Plan  Continue with current plan of care        Recommendations  Diet recommendations: Dysphagia 1 (puree);Thin liquid Medication Administration: Via alternative means (or with puree) Supervision: Patient able to self feed Compensations: Slow rate;Small sips/bites;Follow solids with liquid Postural Changes and/or Swallow Maneuvers: Seated upright 90 degrees;Upright 30-60 min after meal                Oral Care Recommendations: Oral care prior to ice chip/H20 Follow up Recommendations: Inpatient Rehab (TBD) SLP Visit Diagnosis: Dysphagia, pharyngoesophageal phase (R13.14) Plan: Continue with current plan of care       GO                [name] 03/27/2020, 4:20 PM  [name], MS [place] Office [phone] Pager [phone]

## 2020-03-27 NOTE — Progress Notes (Addendum)
PROGRESS NOTE    Veronica Horton  VOH:[id number] DOB: Aug 04, 1960 DOA: 03/10/2020 PCP: [facility]   Chief Complaint  Patient presents with  . Shortness of Breath   Brief Narrative: Patient is Veronica Horton 59 year old female with history of morbid obesity, hiatal hernia, hypertension, COPD, chronic respiratory failure on home oxygen at 5 L/min, smoker who presents to the emergency room  On 03/10/20 with complaint of shortness of breath, altered mental status.  On presentation ABG showed PCO2 of 92 and she had to be kept on BiPAP.  She was found to have laryngeal mass concerning for malignancy and she underwent biopsy which showed invasive squamous  cell carcinoma and she required tracheostomy.  She had to be transferred to ICU post biopsy and had to be kept on ventilator support.  She was transferred to [facility] on 11/17 for radiation therapy.  ransfer to [facility] service from [facility] service on 03/20/2020. Underwent  PEG placement  by IR on 03/22/2020. Plan is to start concurrent chemo radiation during this hospitalization while seeking placement in CIR.  Oncology/radiation oncology following.  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  Found to have airway obstruction/compromise due to laryngeal mass.  CT soft tissue neck on 11/11 showed bulky bilateral supraglottic tumor with possible airway compromise, involvement of right laryngeal cartilages.  ENT was consulted.  She underwent laryngeal biopsy which  showed squamous cell carcinoma.   Status post tracheostomy Underwent PEG placement by IR on 11/22. CT chest/abdomen/pelvis did not show signs of metastatic disease. She was recommended total laryngectomy with bilateral neck dissection but she declined surgery.  Plan is to start concurrent chemoradiation during this hospitalization.  Oncology/radiation oncology following.  Per oncology, anticipate CRT Monday. S/p MBS today, per speech  Acute on chronic hypoxic/hypercarbic respiratory failure: History of COPD, on oxygen at home at 5 L/min.  Chronic respiratory failure exacerbated by laryngeal squamous cell carcinoma, COPD exacerbation, pneumonia.   Hospital course was remarkable for Serratia pneumonia.  Finished the course of antibiotics with levofloxacin.  Currently on trach collar, currently requiring 10 L by trach collar, wean as tolerated Repeat CXR 11/25 with persistent subsegmental atelectasis at R base with improving atelectasis vs consolidation and tiny effusion at L base  COPD exacerbation: Being treated with steroids, bronchodilators.  Continue pulmonary hygiene, ventilator support at night, trach collar during the day.  Follow intermittent chest x-ray.  Aspiration precautions.  Steroids off.  No wheezing appreciated.  Hypoglycemia: resolved with tube feeds, continue to monitor  GERD: Continue PPI  Hypertension: Monitor blood pressure.  Off antihypertensives which she was at home.  Blood pressure okay.  AKI: Resolved  Thrombocytopenia: resolved.  Disposition: PT/OT recommending CIR.  She denies skilled nursing facility placement.  Plan is to start on concurrent chemoradiation during this hospitalization.  Awaiting improvement in oxygen needs prior to CIR.  Nutrition Problem: Increased nutrient needs Etiology: cancer and cancer related treatments, chronic illness  DVT prophylaxis:lovenox Code Status:full  Family Communication: [name] 11/24 - called 11/25, no  answer Disposition:   Status is: Inpatient  Remains inpatient appropriate because:Inpatient level of care appropriate due to severity of illness   Dispo: The patient is from: Home              Anticipated d/c is to: CIR              Anticipated d/c date is: > 3 days              Patient currently is not medically stable to d/c.       Consultants:   PCCM  Rad onc  Oncology  ENT  Procedures:  tracheostomy  Echo IMPRESSIONS    1. Left ventricular ejection fraction, by estimation, is 70 to 75%. The  left ventricle has hyperdynamic function. The left ventricle has no  regional wall motion abnormalities. There is mild left ventricular  hypertrophy. Left ventricular diastolic  parameters were normal.  2. Right ventricular systolic function is normal. The right ventricular  size is normal. Tricuspid regurgitation signal is inadequate for assessing  PA pressure.  3. The mitral valve is grossly normal. Trivial mitral valve  regurgitation.  4. The aortic valve is tricuspid. Aortic valve regurgitation is not  visualized.  5. The inferior vena cava is normal in size with greater than 50%  respiratory variability, suggesting right atrial pressure of 3 mmHg.  Antimicrobials:  Anti-infectives (From admission, onward)   Start     Dose/Rate Route Frequency Ordered Stop   03/22/20 1700  vancomycin (VANCOCIN) IVPB 1000 mg/200 mL premix  Status:  Discontinued        1,000 mg 200 mL/hr over 60 Minutes Intravenous  Once 03/22/20 1605 03/23/20 0236   03/22/20 1604  vancomycin (VANCOCIN) 1-5 GM/200ML-% IVPB       Note to Pharmacy: [name]   : cabinet override      03/22/20 1604 03/22/20 1615   03/14/20 1100  levofloxacin (LEVAQUIN) IVPB 750 mg        750 mg 100 mL/hr over 90 Minutes Intravenous Daily 03/14/20 0955 03/19/20 1146   03/10/20 1730  doxycycline (VIBRAMYCIN) 100 mg in sodium chloride 0.9 % 250 mL IVPB  Status:  Discontinued        100 mg 125 mL/hr over 120  Minutes Intravenous Every 12 hours 03/10/20 1634 03/14/20 0955   03/10/20 1600  azithromycin (ZITHROMAX) 500 mg in sodium chloride 0.9 % 250 mL IVPB  Status:  Discontinued        500 mg 250 mL/hr over 60 Minutes Intravenous Every 24 hours 03/10/20 1529 03/10/20 1538   03/10/20 1500  clindamycin (CLEOCIN) IVPB 600 mg  Status:  Discontinued        600 mg 100 mL/hr over 30 Minutes Intravenous  Once 03/10/20 1458 03/10/20 1521         Subjective: No new complaints today Just came from [facility]  Objective: Vitals:   03/26/20 2102 03/27/20 0408 03/27/20 0730 03/27/20 0738  BP: (!) 106/54 111/65    Pulse: 72 (!) 58  78  Resp:  13  (!) 21  Temp: 98.8 F (37.1 C) 98.5 F (36.9 C)    TempSrc: Oral     SpO2: 94% 94% 95% 95%  Weight:      Height:        Intake/Output Summary (Last 24 hours) at 03/27/2020 1326 Last data filed at 03/27/2020 0616 Gross per 24 hour  Intake 1456 ml  Output 400 ml  Net 1056 ml   [employer]  03/18/20 0500 03/23/20 0500 03/24/20 0500  Weight: 112.5 kg 111.8 kg 111.5 kg    Examination:  General: No acute distress. Cardiovascular: Heart sounds show Xaidyn Kepner regular rate, and rhythm Lungs: unlabored, trach in place Abdomen: Soft, nontender, nondistended, peg Neurological: Alert and oriented 3. Moves all extremities 4 with equal strength. Cranial nerves II through XII grossly intact. Skin: Warm and dry. No rashes or lesions. Extremities: No clubbing or cyanosis. No edema.  Data Reviewed: I have personally reviewed following labs and imaging studies  CBC: Recent Labs  Lab 03/23/20 0301 03/24/20 0748 03/25/20 0243 03/26/20 0535 03/27/20 0616  WBC 13.4* 10.0 9.7 7.6 8.5  NEUTROABS 10.6* 7.4 7.3 5.2 6.4  HGB 13.9 13.1 13.3 12.9 13.0  HCT 44.2 40.6 40.9 40.8 40.0  MCV 100.0 97.8 96.9 99.3 98.3  PLT 163 174 159 167 937    Basic Metabolic Panel: Recent Labs  Lab 03/23/20 0301 03/24/20 0748 03/25/20 0243 03/26/20 0535 03/27/20 0616  NA 139  137 138 137 140  K 4.4 3.8 3.9 4.0 4.4  CL 107 105 105 104 106  CO2 23 22 23 25 26   GLUCOSE 81 134* 115* 141* 137*  BUN 29* 28* 25* 22* 17  CREATININE 0.81 0.96 0.84 0.90 0.88  CALCIUM 8.4* 8.3* 8.4* 8.4* 8.5*  MG  --   --  2.4 2.5* 2.4  PHOS  --   --  4.6 4.5 3.5    GFR: Estimated Creatinine Clearance: 84.6 mL/min (by C-G formula based on SCr of 0.88 mg/dL).  Liver Function Tests: Recent Labs  Lab 03/24/20 0748 03/25/20 0243 03/26/20 0535 03/27/20 0616  AST 14* 14* 15 14*  ALT 19 17 17 15   ALKPHOS 49 54 52 55  BILITOT 0.9 0.6 0.7 0.5  PROT 5.3* 5.4* 5.3* 5.5*  ALBUMIN 2.8* 2.8* 2.7* 2.7*    CBG: Recent Labs  Lab 03/26/20 1936 03/26/20 2338 03/27/20 0405 03/27/20 0754 03/27/20 1143  GLUCAP 74 95 104* 114* 122*     Recent Results (from the past 240 hour(s))  Culture, respiratory (non-expectorated)     Status: None   Collection Time: 03/21/20  4:25 PM   Specimen: Tracheal Aspirate; Respiratory  Result Value Ref Range Status   Specimen Description   Final    TRACHEAL ASPIRATE Performed at [facility] [address] [place], [place] [address]    Special Requests   Final    NONE Performed at [facility], [place] [address], [name], [place] [address]    Gram Stain   Final    FEW WBC PRESENT, PREDOMINANTLY PMN FEW GRAM POSITIVE COCCI IN PAIRS IN CLUSTERS RARE GRAM NEGATIVE RODS    Culture   Final    Normal respiratory flora-no Staph aureus or Pseudomonas seen Performed at [facility], [address] [place], [place] [address]    Report Status 03/23/2020 FINAL  Final         Radiology Studies: No results found.      Scheduled Meds: . arformoterol  15 mcg Nebulization BID  . budesonide (PULMICORT) nebulizer solution  0.5 mg Nebulization BID  . chlorhexidine  15 mL Mouth Rinse BID  . Chlorhexidine Gluconate Cloth  6 each Topical Daily  . docusate  100 mg Per Tube BID  . enoxaparin (LOVENOX)  injection  55 mg Subcutaneous Q24H  . insulin aspart  0-9 Units Subcutaneous Q4H  . ipratropium-albuterol  3 mL Nebulization TID  . mouth rinse  15 mL  Mouth Rinse q12n4p  . multivitamin with minerals  1 tablet Per Tube Daily  . nicotine  21 mg Transdermal Daily  . pantoprazole sodium  40 mg Per Tube QHS   Continuous Infusions: . sodium chloride    . feeding supplement (OSMOLITE 1.5 CAL) 1,000 mL (03/27/20 0922)     LOS: 17 days    Time spent: over 30 min    Fayrene Helper, MD [facility]   To contact the attending provider between 7A-7P or the covering provider during after hours 7P-7A, please log into the web site [email or web address] and access using universal [place] password for that web site. If you do not have the password, please call the hospital operator.  03/27/2020, 1:26 PM

## 2020-03-27 NOTE — Progress Notes (Addendum)
Modified Barium Swallow Progress Note  Patient Details  Name: Veronica Horton MRN: [id number] Date of Birth: 14-Aug-1960  Today's Date: 03/27/2020  Modified Barium Swallow completed.  Full report located under Chart Review in the Imaging Section.  Brief recommendations include the following:  Clinical Impression  Pt continues with with mild pharyngeal phase dysphagia - obstructive due to mass impacting epiglottic deflection.  In addition, she requires extra time to masticate solids due to dentition.  Swallow trigger is timely with minimal laryngeal penetration of liquid into larynx during the swallow due to decreased epiglottic deflection.  Min vallecular residue with solids without pt awareness - liquid wash assisted to clear.  Pt with prominent cricopharyngeus and appears with trace backflow near this region but view was suboptimal.  Recommend D3/mech soft and thin liquids, po medications whole in puree and follow with liquid wash.  Of note, following testing, pt did cough and slight whitish coating noted on secretions but no aspiration noted during testing.  Suspect pt has some low grade chronic secretion aspiration.   Swallow Evaluation Recommendations     SLP Diet Recommendations: Dysphagia 3 (Mech soft) solids;Thin liquid       Medication Administration: Whole meds with puree, follow with liquids   Supervision: Patient able to self feed   Compensations: Slow rate;Small sips/bites;Follow solids with liquid, Follow solids with liquids       Oral Care Recommendations: Oral care BID   Other Recommendations: Clarify dietary restrictions  [name], MS [facility] SLP Acute Rehab Services Office [phone] Pager [phone]    [name] 03/27/2020,4:29 PM

## 2020-03-27 NOTE — Progress Notes (Signed)
OT Cancellation Note  Patient Details Name: Veronica Horton MRN: [id number] DOB: Feb 07, 1961   Cancelled Treatment:    Reason Eval/Treat Not Completed: Patient at procedure or test/ unavailable. Patient going down for swallow evaluation. Will check back as schedule permits.   Delbert Phenix OT OT pager: [phone]   Rosemary Holms 03/27/2020, 12:02 PM

## 2020-03-28 LAB — CBC WITH DIFFERENTIAL/PLATELET
Abs Immature Granulocytes: 0.04 10*3/uL (ref 0.00–0.07)
Basophils Absolute: 0 10*3/uL (ref 0.0–0.1)
Basophils Relative: 0 %
Eosinophils Absolute: 0.2 10*3/uL (ref 0.0–0.5)
Eosinophils Relative: 2 %
HCT: 38 % (ref 36.0–46.0)
Hemoglobin: 12.3 g/dL (ref 12.0–15.0)
Immature Granulocytes: 1 %
Lymphocytes Relative: 16 %
Lymphs Abs: 1.3 10*3/uL (ref 0.7–4.0)
MCH: 31.9 pg (ref 26.0–34.0)
MCHC: 32.4 g/dL (ref 30.0–36.0)
MCV: 98.7 fL (ref 80.0–100.0)
Monocytes Absolute: 0.7 10*3/uL (ref 0.1–1.0)
Monocytes Relative: 8 %
Neutro Abs: 5.7 10*3/uL (ref 1.7–7.7)
Neutrophils Relative %: 73 %
Platelets: 152 10*3/uL (ref 150–400)
RBC: 3.85 MIL/uL — ABNORMAL LOW (ref 3.87–5.11)
RDW: 15.1 % (ref 11.5–15.5)
WBC: 7.9 10*3/uL (ref 4.0–10.5)
nRBC: 0 % (ref 0.0–0.2)

## 2020-03-28 LAB — COMPREHENSIVE METABOLIC PANEL
ALT: 16 U/L (ref 0–44)
AST: 13 U/L — ABNORMAL LOW (ref 15–41)
Albumin: 2.6 g/dL — ABNORMAL LOW (ref 3.5–5.0)
Alkaline Phosphatase: 52 U/L (ref 38–126)
Anion gap: 7 (ref 5–15)
BUN: 17 mg/dL (ref 6–20)
CO2: 27 mmol/L (ref 22–32)
Calcium: 8.4 mg/dL — ABNORMAL LOW (ref 8.9–10.3)
Chloride: 105 mmol/L (ref 98–111)
Creatinine, Ser: 0.95 mg/dL (ref 0.44–1.00)
GFR, Estimated: >60 mL/min (ref >60)
Glucose, Bld: 134 mg/dL — ABNORMAL HIGH (ref 70–99)
Potassium: 4.1 mmol/L (ref 3.5–5.1)
Sodium: 139 mmol/L (ref 135–145)
Total Bilirubin: 0.6 mg/dL (ref 0.3–1.2)
Total Protein: 5.4 g/dL — ABNORMAL LOW (ref 6.5–8.1)

## 2020-03-28 LAB — GLUCOSE, CAPILLARY
Glucose-Capillary: 104 mg/dL — ABNORMAL HIGH (ref 70–99)
Glucose-Capillary: 108 mg/dL — ABNORMAL HIGH (ref 70–99)
Glucose-Capillary: 111 mg/dL — ABNORMAL HIGH (ref 70–99)
Glucose-Capillary: 120 mg/dL — ABNORMAL HIGH (ref 70–99)
Glucose-Capillary: 123 mg/dL — ABNORMAL HIGH (ref 70–99)
Glucose-Capillary: 97 mg/dL (ref 70–99)

## 2020-03-28 LAB — MAGNESIUM: Magnesium: 2.3 mg/dL (ref 1.7–2.4)

## 2020-03-28 LAB — PHOSPHORUS: Phosphorus: 4.4 mg/dL (ref 2.5–4.6)

## 2020-03-28 MED ORDER — PANTOPRAZOLE SODIUM 40 MG PO TBEC
40.0000 mg | DELAYED_RELEASE_TABLET | Freq: Every day | ORAL | Status: DC
  Administered 2020-03-28: 40 mg via ORAL
  Filled 2020-03-28: qty 1

## 2020-03-28 MED ORDER — LIDOCAINE 5 % EX PTCH
1.0000 | MEDICATED_PATCH | CUTANEOUS | Status: DC
  Administered 2020-03-28 – 2020-04-16 (×18): 1 via TRANSDERMAL
  Filled 2020-03-28 (×21): qty 1

## 2020-03-28 MED ORDER — OXYCODONE HCL 5 MG PO TABS
5.0000 mg | ORAL_TABLET | Freq: Four times a day (QID) | ORAL | Status: DC | PRN
  Administered 2020-03-28: 5 mg via ORAL
  Filled 2020-03-28: qty 1

## 2020-03-28 MED ORDER — ADULT MULTIVITAMIN W/MINERALS CH
1.0000 | ORAL_TABLET | Freq: Every day | ORAL | Status: DC
  Administered 2020-03-28: 1 via ORAL
  Filled 2020-03-28: qty 1

## 2020-03-28 MED ORDER — GUAIFENESIN-DM 100-10 MG/5ML PO SYRP
5.0000 mL | ORAL_SOLUTION | ORAL | Status: DC | PRN
  Administered 2020-03-28: 5 mL via ORAL
  Filled 2020-03-28: qty 10

## 2020-03-28 MED ORDER — DOCUSATE SODIUM 100 MG PO CAPS
100.0000 mg | ORAL_CAPSULE | Freq: Two times a day (BID) | ORAL | Status: DC
  Administered 2020-03-28: 100 mg via ORAL

## 2020-03-28 MED ORDER — HYDROXYZINE HCL 10 MG PO TABS
10.0000 mg | ORAL_TABLET | Freq: Three times a day (TID) | ORAL | Status: DC | PRN
  Filled 2020-03-28: qty 1

## 2020-03-28 MED ORDER — ACETAMINOPHEN 325 MG PO TABS: 650.0000 mg | ORAL_TABLET | Freq: Four times a day (QID) | ORAL | Status: DC | PRN

## 2020-03-28 NOTE — Progress Notes (Signed)
PROGRESS NOTE    Veronica Horton  SKA:[id number] DOB: 1961-02-03 DOA: 03/10/2020 PCP: [facility]   Chief Complaint  Patient presents with  . Shortness of Breath   Brief Narrative: Patient is Veronica Horton 59 year old female with history of morbid obesity, hiatal hernia, hypertension, COPD, chronic respiratory failure on home oxygen at 5 L/min, smoker who presents to the emergency room  On 03/10/20 with complaint of shortness of breath, altered mental status.  On presentation ABG showed PCO2 of 92 and she had to be kept on BiPAP.  She was found to have laryngeal mass concerning for malignancy and she underwent biopsy which showed invasive squamous  cell carcinoma and she required tracheostomy.  She had to be transferred to ICU post biopsy and had to be kept on ventilator support.  She was transferred [facility] on [facility]/17 for radiation therapy.  ransfer to [facility] service from [facility] service on 03/20/2020. Underwent  PEG placement  by IR on 03/22/2020. Plan is to start concurrent chemo radiation during this hospitalization while seeking placement in CIR.  Oncology/radiation oncology following.  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  Found to have airway obstruction/compromise due to laryngeal mass.  CT soft tissue neck on 11/11 showed bulky bilateral supraglottic tumor with possible airway compromise, involvement of right laryngeal cartilages.  ENT was consulted.  She underwent laryngeal biopsy which  showed squamous cell carcinoma.   Status post tracheostomy Underwent PEG placement by IR on 11/22. CT chest/abdomen/pelvis did not show signs of metastatic disease. She was recommended total laryngectomy with bilateral neck dissection but she declined surgery.  Plan is to start concurrent chemoradiation during this hospitalization.  Oncology/radiation oncology following.  Per oncology, anticipate CRT Monday. S/p MBS today, per speech dysphagia 1 thin liquids, medication with puree  Acute on chronic hypoxic/hypercarbic respiratory failure: History of COPD, on oxygen at home at 5 L/min.  Chronic respiratory failure exacerbated by laryngeal squamous cell carcinoma, COPD exacerbation, pneumonia.   Hospital course was remarkable for Serratia pneumonia.  Finished the course of antibiotics with levofloxacin.  Currently on trach collar, currently requiring 10 L by trach collar, wean as tolerated Repeat CXR 11/25 with persistent subsegmental atelectasis at R base with improving atelectasis vs consolidation and tiny effusion at L base Repeat CXR 11/29 pending  COPD exacerbation: Being treated with steroids, bronchodilators.  Continue pulmonary hygiene, ventilator support at night, trach collar during the day.  Follow intermittent chest x-ray.  Aspiration precautions.  Steroids off.  No wheezing appreciated.  Hypoglycemia: resolved with tube feeds, continue to monitor  GERD: Continue PPI  Hypertension: Monitor blood pressure.  Off antihypertensives which she was at home.  Blood pressure okay.  AKI: Resolved  Thrombocytopenia: resolved.  Disposition: PT/OT recommending CIR.  She denies skilled nursing facility placement.  Plan is to start on concurrent chemoradiation during this hospitalization.  Awaiting improvement in oxygen needs prior to CIR.  Nutrition Problem: Increased nutrient needs Etiology: cancer and cancer related treatments, chronic illness  DVT prophylaxis:lovenox Code  Status:full  Family Communication: [name] 11/24 - called 11/25, no answer Disposition:   Status is: Inpatient  Remains inpatient appropriate because:Inpatient level of care appropriate due to severity of illness   Dispo: The patient is from: Home              Anticipated d/c is to: CIR              Anticipated d/c date is: > 3 days              Patient currently is not medically stable to d/c.       Consultants:   PCCM  Rad onc  Oncology  ENT  Procedures:  tracheostomy  Echo IMPRESSIONS    1. Left ventricular ejection fraction, by estimation, is 70 to 75%. The  left ventricle has hyperdynamic function. The left ventricle has no  regional wall motion abnormalities. There is mild left ventricular  hypertrophy. Left ventricular diastolic  parameters were normal.  2. Right ventricular systolic function is normal. The right ventricular  size is normal. Tricuspid regurgitation signal is inadequate for assessing  PA pressure.  3. The mitral valve is grossly normal. Trivial mitral valve  regurgitation.  4. The aortic valve is tricuspid. Aortic valve regurgitation is not  visualized.  5. The inferior vena cava is normal in size with greater than 50%  respiratory variability, suggesting right atrial pressure of 3 mmHg.  Antimicrobials:  Anti-infectives (From admission, onward)   Start     Dose/Rate Route Frequency Ordered Stop   03/22/20 1700  vancomycin (VANCOCIN) IVPB 1000 mg/200 mL premix  Status:  Discontinued        1,000 mg 200 mL/hr over 60 Minutes Intravenous  Once 03/22/20 1605 03/23/20 0236   03/22/20 1604  vancomycin (VANCOCIN) 1-5 GM/200ML-% IVPB       Note to Pharmacy: [name]   : cabinet override      03/22/20 1604 03/22/20 1615   03/14/20 1100  levofloxacin (LEVAQUIN) IVPB 750 mg        750 mg 100 mL/hr over 90 Minutes Intravenous Daily 03/14/20 0955 03/19/20 1146   03/10/20 1730  doxycycline (VIBRAMYCIN) 100 mg in sodium chloride 0.9 % 250  mL IVPB  Status:  Discontinued        100 mg 125 mL/hr over 120 Minutes Intravenous Every 12 hours 03/10/20 1634 03/14/20 0955   03/10/20 1600  azithromycin (ZITHROMAX) 500 mg in sodium chloride 0.9 % 250 mL IVPB  Status:  Discontinued        500 mg 250 mL/hr over 60 Minutes Intravenous Every 24 hours 03/10/20 1529 03/10/20 1538   03/10/20 1500  clindamycin (CLEOCIN) IVPB 600 mg  Status:  Discontinued        600 mg 100 mL/hr over 30 Minutes Intravenous  Once 03/10/20 1458 03/10/20 1521       Subjective: No new complaints Just coughed up some mucus  Objective: Vitals:   03/28/20 0625 03/28/20 0728 03/28/20 1317 03/28/20 1404  BP: (!) 105/59  (!) 109/56   Pulse: (!) 50  66   Resp: 18  18   Temp: 98.2 F (36.8 C)  98.3 F (36.8 C)   TempSrc:   Oral   SpO2: 97% 98% 96% 97%  Weight: 113.4 kg     Height:        Intake/Output Summary (Last 24 hours) at 03/28/2020 1828 Last data filed at 03/28/2020 1008 Gross per 24 hour  Intake 240 ml  Output 1500  ml  Net -1260 ml   Filed Weights   03/23/20 0500 03/24/20 0500 03/28/20 0625  Weight: 111.8 kg 111.5 kg 113.4 kg    Examination:  General: No acute distress. Cardiovascular: Heart sounds show Vietta Bonifield regular rate, and rhythm.  Lungs: Clear to auscultation bilaterally. trach Abdomen: Soft, nontender, nondistended.  peg Neurological: Alert and oriented 3. Moves all extremities 4. Cranial nerves II through XII grossly intact. Skin: Warm and dry. No rashes or lesions. Extremities: No clubbing or cyanosis. No edema.    Data Reviewed: I have personally reviewed following labs and imaging studies  CBC: Recent Labs  Lab 03/24/20 0748 03/25/20 0243 03/26/20 0535 03/27/20 0616 03/28/20 0646  WBC 10.0 9.7 7.6 8.5 7.9  NEUTROABS 7.4 7.3 5.2 6.4 5.7  HGB 13.1 13.3 12.9 13.0 12.3  HCT 40.6 40.9 40.8 40.0 38.0  MCV 97.8 96.9 99.3 98.3 98.7  PLT 174 159 167 158 735    Basic Metabolic Panel: Recent Labs  Lab 03/24/20 0748  03/25/20 0243 03/26/20 0535 03/27/20 0616 03/28/20 0646  NA 137 138 137 140 139  K 3.8 3.9 4.0 4.4 4.1  CL 105 105 104 106 105  CO2 22 23 25 26 27   GLUCOSE 134* 115* 141* 137* 134*  BUN 28* 25* 22* 17 17  CREATININE 0.96 0.84 0.90 0.88 0.95  CALCIUM 8.3* 8.4* 8.4* 8.5* 8.4*  MG  --  2.4 2.5* 2.4 2.3  PHOS  --  4.6 4.5 3.5 4.4    GFR: Estimated Creatinine Clearance: 79.1 mL/min (by C-G formula based on SCr of 0.95 mg/dL).  Liver Function Tests: Recent Labs  Lab 03/24/20 0748 03/25/20 0243 03/26/20 0535 03/27/20 0616 03/28/20 0646  AST 14* 14* 15 14* 13*  ALT 19 17 17 15 16   ALKPHOS 49 54 52 55 52  BILITOT 0.9 0.6 0.7 0.5 0.6  PROT 5.3* 5.4* 5.3* 5.5* 5.4*  ALBUMIN 2.8* 2.8* 2.7* 2.7* 2.6*    CBG: Recent Labs  Lab 03/27/20 2358 03/28/20 0418 03/28/20 0730 03/28/20 1138 03/28/20 1623  GLUCAP 97 108* 123* 120* 104*     Recent Results (from the past 240 hour(s))  Culture, respiratory (non-expectorated)     Status: None   Collection Time: 03/21/20  4:25 PM   Specimen: Tracheal Aspirate; Respiratory  Result Value Ref Range Status   Specimen Description   Final    TRACHEAL ASPIRATE Performed at [facility] [address] [place], [place] [address]    Special Requests   Final    NONE Performed at [facility], [place] [address], [place], [place] [address]    Gram Stain   Final    FEW WBC PRESENT, PREDOMINANTLY PMN FEW GRAM POSITIVE COCCI IN PAIRS IN CLUSTERS RARE GRAM NEGATIVE RODS    Culture   Final    Normal respiratory flora-no Staph aureus or Pseudomonas seen Performed at [facility], [address], [place], [place] [address]    Report Status 03/23/2020 FINAL  Final         Radiology Studies: DG Swallowing Func-Speech Pathology  Result Date: 03/27/2020 Objective Swallowing Evaluation: Type of Study: MBS-Modified Barium Swallow Study  Patient Details Name: JENNIFER PAYES MRN: [id number] Date of  Birth: 1960-08-06 Today's Date: 03/27/2020 Time: SLP Start Time (ACUTE ONLY): 1941 -SLP Stop Time (ACUTE ONLY): 1235 SLP Time Calculation (min) (ACUTE ONLY): 20 min Past Medical History: Past Medical History: Diagnosis Date . Bronchitis  . Class 3 obesity 01/23/2020 . COPD (  chronic obstructive pulmonary disease) ([place])  . Degenerative disc disease, lumbar  . Hiatal hernia  . Hypertension  Past Surgical History: Past Surgical History: Procedure Laterality Date . CHOLECYSTECTOMY   . degenerative bone disease   . DIRECT LARYNGOSCOPY N/Ajooni Karam 03/13/2020  Procedure: DIRECT LARYNGOSCOPY WITH BIOPSY;  Surgeon: Jason Coop, DO;  Location: [facility];  Service: ENT;  Laterality: N/Govani Radloff; . INCISIONAL HERNIA REPAIR N/Albin Duckett 01/15/2019  Procedure: [name] HERNIORRHAPHY WITH MESH;  Surgeon: [name], MD;  Location: [facility];  Service: General;  Laterality: N/Zakarie Sturdivant; . IR GASTROSTOMY TUBE MOD SED  03/22/2020 . OMENTECTOMY N/Lilly Gasser 01/15/2019  Procedure: OMENTECTOMY;  Surgeon: [name], MD;  Location: [facility];  Service: General;  Laterality: N/Ruger Saxer; . TRACHEOSTOMY TUBE PLACEMENT N/Ryley Bachtel 03/13/2020  Procedure: AWAKE TRACHEOSTOMY;  Surgeon: Jason Coop, DO;  Location: [facility];  Service: ENT;  Laterality: N/Kensi Karr; HPI: Chessica Audia is Oda Lansdowne 59 y/o F with history of COPD,chronic respiratory failure on home oxygen at 5 L/min, with large laryngeal mass lesion, now s/p tracheostomy with Shiley 6-0 and direct laryngoscopy and biopsy. 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  Pt is also s/p PEG for nutrition.  Plans are for chemo and radiation concurrently starting while in hospital.  Prior medical history includes smoking and some dyspnea if walking long distanced.  Subjective: pt awake in chair Assessment / Plan / Recommendation CHL IP CLINICAL IMPRESSIONS 03/27/2020 Clinical Impression Pt continues with with mild pharyngeal phase dysphagia - obstructive due to mass impacting epiglottic deflection.  In addition, she requires extra time  to masticate solids due to dentition.  Swallow trigger is timely with minimal laryngeal penetration of liquid into larynx during the swallow due to decreased epiglottic deflection.  Min vallecular residue with solids without pt awareness - liquid wash assisted to clear.  Pt with prominent cricopharyngeus and appears with trace backflow near this region but view was suboptimal.  Recommend D3/mech soft and thin liquids, po medications whole in puree and follow with liquid wash.  Of note, following testing, pt did cough and slight whitish coating noted on secretions but no aspiration noted during testing.  Suspect pt has some low grade chronic secretion aspiration. SLP Visit Diagnosis Dysphagia, oropharyngeal phase (R13.12) Attention and concentration deficit following -- Frontal lobe and executive function deficit following -- Impact on safety and function Mild aspiration risk   CHL IP TREATMENT RECOMMENDATION 03/27/2020 Treatment Recommendations Therapy as outlined in treatment plan below   Prognosis 03/27/2020 Prognosis for Safe Diet Advancement Fair Barriers to Reach Goals Other (Comment) Barriers/Prognosis Comment -- CHL IP DIET RECOMMENDATION 03/27/2020 SLP Diet Recommendations Dysphagia 3 (Mech soft) solids;Thin liquid Liquid Administration via -- Medication Administration Whole meds with puree Compensations Slow rate;Small sips/bites Postural Changes --   CHL IP OTHER RECOMMENDATIONS 03/27/2020 Recommended Consults (No Data) Oral Care Recommendations Oral care BID Other Recommendations Clarify dietary restrictions   CHL IP FOLLOW UP RECOMMENDATIONS 03/27/2020 Follow up Recommendations Skilled Nursing facility   [facility] IP FREQUENCY AND DURATION 03/27/2020 Speech Therapy Frequency (ACUTE ONLY) min 1 x/week Treatment Duration 1 week      CHL IP ORAL PHASE 03/27/2020 Oral Phase WFL Oral - Pudding Teaspoon -- Oral - Pudding Cup -- Oral - Honey Teaspoon -- Oral - Honey Cup -- Oral - Nectar Teaspoon -- Oral - Nectar Cup  -- Oral - Nectar Straw -- Oral - Thin Teaspoon -- Oral - Thin Cup -- Oral - Thin Straw -- Oral - Puree -- Oral - Mech Soft -- Oral - Regular --  Oral - Multi-Consistency -- Oral - Pill -- Oral Phase - Comment --  CHL IP PHARYNGEAL PHASE 03/27/2020 Pharyngeal Phase -- Pharyngeal- Pudding Teaspoon -- Pharyngeal -- Pharyngeal- Pudding Cup -- Pharyngeal -- Pharyngeal- Honey Teaspoon -- Pharyngeal -- Pharyngeal- Honey Cup -- Pharyngeal -- Pharyngeal- Nectar Teaspoon -- Pharyngeal -- Pharyngeal- Nectar Cup -- Pharyngeal -- Pharyngeal- Nectar Straw Reduced epiglottic inversion;Pharyngeal residue - valleculae Pharyngeal -- Pharyngeal- Thin Teaspoon Reduced epiglottic inversion;Pharyngeal residue - valleculae Pharyngeal -- Pharyngeal- Thin Cup Pharyngeal residue - valleculae;Reduced epiglottic inversion;Penetration/Aspiration during swallow Pharyngeal Material enters airway, remains ABOVE vocal cords then ejected out Pharyngeal- Thin Straw Reduced epiglottic inversion;Pharyngeal residue - valleculae;Penetration/Aspiration during swallow Pharyngeal Material enters airway, remains ABOVE vocal cords then ejected out Pharyngeal- Puree WFL Pharyngeal -- Pharyngeal- Mechanical Soft -- Pharyngeal -- Pharyngeal- Regular Reduced epiglottic inversion Pharyngeal -- Pharyngeal- Multi-consistency -- Pharyngeal -- Pharyngeal- Pill NT Pharyngeal -- Pharyngeal Comment --  CHL IP CERVICAL ESOPHAGEAL PHASE 03/27/2020 Cervical Esophageal Phase -- Pudding Teaspoon -- Pudding Cup -- Honey Teaspoon -- Honey Cup -- Nectar Teaspoon -- Nectar Cup -- Nectar Straw -- Thin Teaspoon -- Thin Cup Prominent cricopharyngeal segment Thin Straw -- Puree -- Mechanical Soft -- Regular -- Multi-consistency -- Pill -- Cervical Esophageal Comment appearance of potential minimal backflow of liquid near UES region = suboptimal view - did not backflow into pharynx/larynx [name], MS [facility] SLP Acute Rehab Services Office [phone] Pager [phone] [name] 03/27/2020, 2:31 PM                   Scheduled Meds: . arformoterol  15 mcg Nebulization BID  . budesonide (PULMICORT) nebulizer solution  0.5 mg Nebulization BID  . chlorhexidine  15 mL Mouth Rinse BID  . Chlorhexidine Gluconate Cloth  6 each Topical Daily  . docusate sodium  100 mg Oral BID  . enoxaparin (LOVENOX) injection  55 mg Subcutaneous Q24H  . insulin aspart  0-9 Units Subcutaneous Q4H  . ipratropium-albuterol  3 mL Nebulization TID  . lidocaine  1 patch Transdermal Q24H  . mouth rinse  15 mL Mouth Rinse q12n4p  . multivitamin with minerals  1 tablet Oral Daily  . nicotine  21 mg Transdermal Daily  . pantoprazole  40 mg Oral Daily  . scopolamine  1 patch Transdermal Q72H   Continuous Infusions: . sodium chloride    . feeding supplement (OSMOLITE 1.5 CAL) 1,000 mL (03/28/20 0421)     LOS: 18 days    Time spent: over 30 min    Fayrene Helper, MD [facility]   To contact the attending provider between 7A-7P or the covering provider during after hours 7P-7A, please log into the web site [email or web address] and access using universal [place] password for that web site. If you do not have the password, please call the hospital operator.  03/28/2020, 6:28 PM

## 2020-03-28 NOTE — Progress Notes (Signed)
Occupational Therapy Treatment Patient Details Name: BROOK GERACI MRN: [id number] DOB: 1960/11/19 Today's Date: 03/28/2020    History of present illness Ms. Sammarco is a 59 year old female with medical history significant for COPD with chronic hypoxic respiratory failure on 5 L, HTN, tobacco use, obesity class 3 who presented on 11/10 with worsening shortness of breath over the past 4 days and increased confusion. Pt found to be in COPD exacerbation, hospital course at [facility] complicated by dysphasia, pt found to have a laryngeal mass. Pt transferred to [facility]. Pt underwent trach placement on 11/13 s/p layrngeal mass biopsy, went on vent 11/14 in AM due to desaturation..11/22 G-tube   OT comments  Treatment focused on improving patient's ability to perform self care tasks and activity tolerance. Patient stood at sink to perform grooming tasks for approx 3 minutes with supervision. Patient mod I for supine to sit, min guard for taking steps to sink and transfer to recliner. Patient making progress towards goals.    Follow Up Recommendations  CIR;Supervision/Assistance - 24 hour    Equipment Recommendations  3 in 1 bedside commode    Recommendations for Other Services      Precautions / Restrictions Precautions Precautions: Fall Precaution Comments: trach collar, G-tube Restrictions Weight Bearing Restrictions: No       Mobility Bed Mobility   Bed Mobility: Supine to Sit     Supine to sit: Modified independent (Device/Increase time)     General bed mobility comments: Mod I for supine to sit.  Transfers Overall transfer level: Needs assistance Equipment used: None Transfers: Stand Pivot Transfers;Sit to/from Stand Sit to Stand: Min guard Stand pivot transfers: Min guard       General transfer comment: Min guard to take steps to sink, stand and then pivot to sit in recliner. Patient able to stand and position pillows as she wanted in recliner before turning and lifting  herself all the way back into recliner.    Balance Overall balance assessment: Mild deficits observed, not formally tested Sitting-balance support: No upper extremity supported;Feet supported Sitting balance-Leahy Scale: Good     Standing balance support: Single extremity supported Standing balance-Leahy Scale: Poor Standing balance comment: Dependent on one hand for steadying herself                           ADL either performed or assessed with clinical judgement   ADL Overall ADL's : Needs assistance/impaired Eating/Feeding: Independent   Grooming: Wash/dry hands;Wash/dry face;Oral care;Standing Grooming Details (indicate cue type and reason): Patient performed grooming tasks at sink. Patient propping on sink counter for support.                                     Vision Patient Visual Report: No change from baseline     Perception     Praxis      Cognition Arousal/Alertness: Awake/alert Behavior During Therapy: WFL for tasks assessed/performed Overall Cognitive Status: Within Functional Limits for tasks assessed                                          Exercises     Shoulder Instructions       General Comments      Pertinent Vitals/ Pain  Pain Assessment: No/denies pain  Home Living                                          Prior Functioning/Environment              Frequency  Min 2X/week        Progress Toward Goals  OT Goals(current goals can now be found in the care plan section)  Progress towards OT goals: Progressing toward goals  Acute Rehab OT Goals Patient Stated Goal: to go home OT Goal Formulation: With patient Time For Goal Achievement: 04/11/20 Potential to Achieve Goals: Good  Plan Discharge plan remains appropriate    Co-evaluation          OT goals addressed during session: ADL's and self-care      AM-PAC OT "6 Clicks" Daily Activity     Outcome  Measure   Help from another person eating meals?: None Help from another person taking care of personal grooming?: A Little Help from another person toileting, which includes using toliet, bedpan, or urinal?: A Little Help from another person bathing (including washing, rinsing, drying)?: A Lot Help from another person to put on and taking off regular upper body clothing?: A Little Help from another person to put on and taking off regular lower body clothing?: A Lot 6 Click Score: 17    End of Session Equipment Utilized During Treatment: Oxygen  OT Visit Diagnosis: Unsteadiness on feet (R26.81);Muscle weakness (generalized) (M62.81);Pain   Activity Tolerance Patient tolerated treatment well   Patient Left in chair;with call bell/phone within reach   Nurse Communication Mobility status        Time: 0950-0959 OT Time Calculation (min): 9 min  Charges: OT General Charges $OT Visit: 1 Visit OT Treatments $Self Care/Home Management : 8-22 mins  Derl Barrow, OTR/L [facility]  Office [phone] Pager: [place] 03/28/2020, 1:12 PM

## 2020-03-29 ENCOUNTER — Inpatient Hospital Stay (HOSPITAL_COMMUNITY): Payer: Medicaid Other

## 2020-03-29 ENCOUNTER — Ambulatory Visit
Admit: 2020-03-29 | Discharge: 2020-03-29 | Disposition: A | Discharge status: Home / Self Care | Payer: Medicaid Other | Attending: Radiation Oncology | Admitting: Radiation Oncology

## 2020-03-29 DIAGNOSIS — C321 Malignant neoplasm of supraglottis: Secondary | ICD-10-CM

## 2020-03-29 DIAGNOSIS — C4492 Squamous cell carcinoma of skin, unspecified: Secondary | ICD-10-CM

## 2020-03-29 DIAGNOSIS — Z430 Encounter for attention to tracheostomy: Secondary | ICD-10-CM

## 2020-03-29 LAB — CBC WITH DIFFERENTIAL/PLATELET
Abs Immature Granulocytes: 0.03 10*3/uL (ref 0.00–0.07)
Basophils Absolute: 0 10*3/uL (ref 0.0–0.1)
Basophils Relative: 0 %
Eosinophils Absolute: 0.2 10*3/uL (ref 0.0–0.5)
Eosinophils Relative: 2 %
HCT: 37.7 % (ref 36.0–46.0)
Hemoglobin: 12.1 g/dL (ref 12.0–15.0)
Immature Granulocytes: 0 %
Lymphocytes Relative: 19 %
Lymphs Abs: 1.3 10*3/uL (ref 0.7–4.0)
MCH: 31.8 pg (ref 26.0–34.0)
MCHC: 32.1 g/dL (ref 30.0–36.0)
MCV: 99 fL (ref 80.0–100.0)
Monocytes Absolute: 0.6 10*3/uL (ref 0.1–1.0)
Monocytes Relative: 9 %
Neutro Abs: 4.8 10*3/uL (ref 1.7–7.7)
Neutrophils Relative %: 70 %
Platelets: 149 10*3/uL — ABNORMAL LOW (ref 150–400)
RBC: 3.81 MIL/uL — ABNORMAL LOW (ref 3.87–5.11)
RDW: 15.2 % (ref 11.5–15.5)
WBC: 7 10*3/uL (ref 4.0–10.5)
nRBC: 0 % (ref 0.0–0.2)

## 2020-03-29 LAB — MAGNESIUM: Magnesium: 2.3 mg/dL (ref 1.7–2.4)

## 2020-03-29 LAB — COMPREHENSIVE METABOLIC PANEL
ALT: 15 U/L (ref 0–44)
AST: 14 U/L — ABNORMAL LOW (ref 15–41)
Albumin: 2.7 g/dL — ABNORMAL LOW (ref 3.5–5.0)
Alkaline Phosphatase: 53 U/L (ref 38–126)
Anion gap: 8 (ref 5–15)
BUN: 19 mg/dL (ref 6–20)
CO2: 27 mmol/L (ref 22–32)
Calcium: 8.4 mg/dL — ABNORMAL LOW (ref 8.9–10.3)
Chloride: 103 mmol/L (ref 98–111)
Creatinine, Ser: 0.97 mg/dL (ref 0.44–1.00)
GFR, Estimated: >60 mL/min (ref >60)
Glucose, Bld: 130 mg/dL — ABNORMAL HIGH (ref 70–99)
Potassium: 4.4 mmol/L (ref 3.5–5.1)
Sodium: 138 mmol/L (ref 135–145)
Total Bilirubin: 0.7 mg/dL (ref 0.3–1.2)
Total Protein: 5.6 g/dL — ABNORMAL LOW (ref 6.5–8.1)

## 2020-03-29 LAB — GLUCOSE, CAPILLARY
Glucose-Capillary: 117 mg/dL — ABNORMAL HIGH (ref 70–99)
Glucose-Capillary: 127 mg/dL — ABNORMAL HIGH (ref 70–99)
Glucose-Capillary: 127 mg/dL — ABNORMAL HIGH (ref 70–99)
Glucose-Capillary: 149 mg/dL — ABNORMAL HIGH (ref 70–99)
Glucose-Capillary: 194 mg/dL — ABNORMAL HIGH (ref 70–99)
Glucose-Capillary: 78 mg/dL (ref 70–99)

## 2020-03-29 LAB — BRAIN NATRIURETIC PEPTIDE: B Natriuretic Peptide: 64.6 pg/mL (ref 0.0–100.0)

## 2020-03-29 LAB — PHOSPHORUS: Phosphorus: 4.4 mg/dL (ref 2.5–4.6)

## 2020-03-29 MED ORDER — PANTOPRAZOLE SODIUM 40 MG PO PACK
40.0000 mg | PACK | Freq: Every day | ORAL | Status: DC
  Administered 2020-03-29 – 2020-04-16 (×19): 40 mg
  Filled 2020-03-29 (×20): qty 20

## 2020-03-29 MED ORDER — SODIUM CHLORIDE 0.9 % IV SOLN
Freq: Once | INTRAVENOUS | Status: AC
  Filled 2020-03-29: qty 10

## 2020-03-29 MED ORDER — HYDROXYZINE HCL 10 MG PO TABS
10.0000 mg | ORAL_TABLET | Freq: Three times a day (TID) | ORAL | Status: DC | PRN
  Administered 2020-04-07 – 2020-04-09 (×2): 10 mg
  Filled 2020-03-29 (×3): qty 1

## 2020-03-29 MED ORDER — DOCUSATE SODIUM 50 MG/5ML PO LIQD
100.0000 mg | Freq: Two times a day (BID) | ORAL | Status: DC
  Administered 2020-03-29 – 2020-04-13 (×28): 100 mg
  Filled 2020-03-29 (×32): qty 10

## 2020-03-29 MED ORDER — ADULT MULTIVITAMIN W/MINERALS CH
1.0000 | ORAL_TABLET | Freq: Every day | ORAL | Status: DC
  Administered 2020-03-29 – 2020-04-15 (×19): 1
  Filled 2020-03-29 (×18): qty 1

## 2020-03-29 MED ORDER — GUAIFENESIN-DM 100-10 MG/5ML PO SYRP
5.0000 mL | ORAL_SOLUTION | ORAL | Status: DC | PRN
  Administered 2020-03-29 – 2020-04-02 (×3): 5 mL
  Filled 2020-03-29 (×3): qty 10

## 2020-03-29 MED ORDER — OXYCODONE HCL 5 MG PO TABS
5.0000 mg | ORAL_TABLET | Freq: Four times a day (QID) | ORAL | Status: DC | PRN
  Administered 2020-03-29 – 2020-04-15 (×32): 5 mg
  Filled 2020-03-29 (×33): qty 1

## 2020-03-29 MED ORDER — ENSURE ENLIVE PO LIQD
237.0000 mL | Freq: Two times a day (BID) | ORAL | Status: DC
  Administered 2020-03-29 – 2020-03-31 (×4): 237 mL via ORAL

## 2020-03-29 MED ORDER — SODIUM CHLORIDE 0.9 % IV SOLN: Freq: Once | INTRAVENOUS | Status: AC

## 2020-03-29 MED ORDER — OSMOLITE 1.5 CAL PO LIQD
237.0000 mL | Freq: Three times a day (TID) | ORAL | Status: DC
  Administered 2020-03-29 – 2020-03-30 (×4): 237 mL
  Filled 2020-03-29 (×8): qty 237

## 2020-03-29 MED ORDER — SODIUM CHLORIDE 0.9 % IV SOLN
10.0000 mg | Freq: Once | INTRAVENOUS | Status: AC
  Administered 2020-03-29: 10 mg via INTRAVENOUS
  Filled 2020-03-29: qty 1

## 2020-03-29 MED ORDER — SODIUM CHLORIDE 0.9 % IV SOLN
150.0000 mg | Freq: Once | INTRAVENOUS | Status: AC
  Administered 2020-03-29: 150 mg via INTRAVENOUS
  Filled 2020-03-29: qty 5

## 2020-03-29 MED ORDER — SODIUM CHLORIDE 0.9 % IV SOLN
40.0000 mg/m2 | Freq: Once | INTRAVENOUS | Status: AC
  Administered 2020-03-29: 91 mg via INTRAVENOUS
  Filled 2020-03-29: qty 91

## 2020-03-29 MED ORDER — PALONOSETRON HCL INJECTION 0.25 MG/5ML
0.2500 mg | Freq: Once | INTRAVENOUS | Status: AC
  Administered 2020-03-29: 0.25 mg via INTRAVENOUS
  Filled 2020-03-29: qty 5

## 2020-03-29 MED ORDER — FUROSEMIDE 10 MG/ML IJ SOLN
20.0000 mg | Freq: Once | INTRAMUSCULAR | Status: AC
  Administered 2020-03-29: 20 mg via INTRAVENOUS
  Filled 2020-03-29: qty 2

## 2020-03-29 MED ORDER — PROCHLORPERAZINE MALEATE 10 MG PO TABS: 10.0000 mg | ORAL_TABLET | Freq: Four times a day (QID) | ORAL | Status: DC | PRN

## 2020-03-29 MED ORDER — ACETAMINOPHEN 160 MG/5ML PO SOLN
650.0000 mg | Freq: Four times a day (QID) | ORAL | Status: DC | PRN
  Administered 2020-04-08 – 2020-04-13 (×3): 650 mg
  Filled 2020-03-29 (×3): qty 20.3

## 2020-03-29 NOTE — Progress Notes (Signed)
Hemoptysis noted. Manual CPT held at this time.

## 2020-03-29 NOTE — Progress Notes (Signed)
Inpatient Rehabilitation Admissions Coordinator  PT recommending [facility] and OT recommends CIR. Remains on 60% trach collar. Feel patient can progress to d/c home with [facility] once oxygen needs are manageable at home.  Danne Baxter, RN, MSN Rehab Admissions Coordinator [phone] 03/29/2020 2:13 PM

## 2020-03-29 NOTE — Progress Notes (Signed)
PT Cancellation Note  Patient Details Name: Veronica Horton MRN: [id number] DOB: 07-05-1960   Cancelled Treatment:    Reason Eval/Treat Not Completed: Medical issues which prohibited therapy Pt just returned from radiation and to start chemotherapy today.  Will check back as schedule permits.   Haylin Camilli,KATHrine E 03/29/2020, 9:43 AM Jannette Spanner PT, DPT Acute Rehabilitation Services Pager: [phone] Office: [phone]

## 2020-03-29 NOTE — Progress Notes (Signed)
PROGRESS NOTE    Veronica Horton  JOI:[id number] DOB: 1961-04-13 DOA: 03/10/2020 PCP: [facility]   Chief Complaint  Patient presents with  . Shortness of Breath   Brief Narrative: Patient is Veronica Horton 59 year old female with history of morbid obesity, hiatal hernia, hypertension, COPD, chronic respiratory failure on home oxygen at 5 L/min, smoker who presents to the emergency room  On 03/10/20 with complaint of shortness of breath, altered mental status.  On presentation ABG showed PCO2 of 92 and she had to be kept on BiPAP.  She was found to have laryngeal mass concerning for malignancy and she underwent biopsy which showed invasive squamous  cell carcinoma and she required tracheostomy.  She had to be transferred to ICU post biopsy and had to be kept on ventilator support.  She was transferred to [facility] on 11/17 for radiation therapy.  ransfer to [facility] service from [facility] service on 03/20/2020. Underwent  PEG placement  by IR on 03/22/2020. Plan is to start concurrent chemo radiation during this hospitalization while seeking placement in CIR.  Oncology/radiation oncology following.  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  Found to have airway obstruction/compromise due to laryngeal mass.  CT soft tissue neck on 11/11 showed bulky bilateral supraglottic tumor with possible airway compromise, involvement of right laryngeal cartilages.  ENT was consulted.  She underwent laryngeal biopsy which  showed squamous cell carcinoma.   Status post tracheostomy Underwent PEG placement by IR on 11/22. CT chest/abdomen/pelvis did not show signs of metastatic disease. She was recommended total laryngectomy with bilateral neck dissection but she declined surgery.  Plan is to start concurrent chemoradiation during this hospitalization.  Oncology/radiation oncology following.  Per oncology, anticipate CRT 11/29 (today). S/p MBS today, per speech dysphagia 1 thin liquids, medication with puree  Acute on chronic hypoxic/hypercarbic respiratory failure: History of COPD, on oxygen at home at 5 L/min.  Chronic respiratory failure exacerbated by laryngeal squamous cell carcinoma, COPD exacerbation, pneumonia.   Hospital course was remarkable for Serratia pneumonia.  Finished the course of antibiotics with levofloxacin.  Currently on trach collar, currently requiring 10 L by trach collar, wean as tolerated Repeat CXR 11/25 with persistent subsegmental atelectasis at R base with improving atelectasis vs consolidation and tiny effusion at L base Repeat CXR 11/29 with interval small bilateral effusions -> trial of lasix  COPD exacerbation: Being treated with steroids, bronchodilators.  Continue pulmonary hygiene, ventilator support at night, trach collar during the day.  Follow intermittent chest x-ray.  Aspiration precautions.  Steroids off.  No wheezing appreciated.  Hypoglycemia: resolved with tube feeds, continue to monitor  GERD: Continue PPI  Hypertension: Monitor blood pressure.  Off antihypertensives which she was at home.  Blood pressure okay.  AKI: Resolved  Thrombocytopenia: resolved.  Disposition: PT/OT recommending CIR.  She denies skilled nursing facility placement.  Plan is to start on concurrent chemoradiation during this hospitalization.  Awaiting improvement in oxygen needs prior to CIR.  Nutrition Problem: Increased nutrient needs Etiology: cancer and  cancer related  treatments, chronic illness  DVT prophylaxis:lovenox Code Status:full  Family Communication: [name] 11/24 - called 11/25, no answer Disposition:   Status is: Inpatient  Remains inpatient appropriate because:Inpatient level of care appropriate due to severity of illness   Dispo: The patient is from: Home              Anticipated d/c is to: CIR              Anticipated d/c date is: > 3 days              Patient currently is not medically stable to d/c.       Consultants:   PCCM  Rad onc  Oncology  ENT  Procedures:  tracheostomy  Echo IMPRESSIONS    1. Left ventricular ejection fraction, by estimation, is 70 to 75%. The  left ventricle has hyperdynamic function. The left ventricle has no  regional wall motion abnormalities. There is mild left ventricular  hypertrophy. Left ventricular diastolic  parameters were normal.  2. Right ventricular systolic function is normal. The right ventricular  size is normal. Tricuspid regurgitation signal is inadequate for assessing  PA pressure.  3. The mitral valve is grossly normal. Trivial mitral valve  regurgitation.  4. The aortic valve is tricuspid. Aortic valve regurgitation is not  visualized.  5. The inferior vena cava is normal in size with greater than 50%  respiratory variability, suggesting right atrial pressure of 3 mmHg.  Antimicrobials:  Anti-infectives (From admission, onward)   Start     Dose/Rate Route Frequency Ordered Stop   03/22/20 1700  vancomycin (VANCOCIN) IVPB 1000 mg/200 mL premix  Status:  Discontinued        1,000 mg 200 mL/hr over 60 Minutes Intravenous  Once 03/22/20 1605 03/23/20 0236   03/22/20 1604  vancomycin (VANCOCIN) 1-5 GM/200ML-% IVPB       Note to Pharmacy: [name]   : cabinet override      03/22/20 1604 03/22/20 1615   03/14/20 1100  levofloxacin (LEVAQUIN) IVPB 750 mg        750 mg 100 mL/hr over 90 Minutes Intravenous Daily 03/14/20 0955 03/19/20 1146   03/10/20 1730   doxycycline (VIBRAMYCIN) 100 mg in sodium chloride 0.9 % 250 mL IVPB  Status:  Discontinued        100 mg 125 mL/hr over 120 Minutes Intravenous Every 12 hours 03/10/20 1634 03/14/20 0955   03/10/20 1600  azithromycin (ZITHROMAX) 500 mg in sodium chloride 0.9 % 250 mL IVPB  Status:  Discontinued        500 mg 250 mL/hr over 60 Minutes Intravenous Every 24 hours 03/10/20 1529 03/10/20 1538   03/10/20 1500  clindamycin (CLEOCIN) IVPB 600 mg  Status:  Discontinued        600 mg 100 mL/hr over 30 Minutes Intravenous  Once 03/10/20 1458 03/10/20 1521       Subjective: No new complaints today  Objective: Vitals:   03/29/20 0500 03/29/20 0600 03/29/20 0748 03/29/20 1313  BP:      Pulse:      Resp: 18 17    Temp:      TempSrc:      SpO2:   94% 95%  Weight: 113.8 kg     Height:        Intake/Output Summary (Last 24 hours) at 03/29/2020 1442 Last data filed at 03/29/2020 1334 Gross per 24 hour  Intake 120 ml  Output 2400 ml  Net -  2280 ml   Filed Weights   03/24/20 0500 03/28/20 0625 03/29/20 0500  Weight: 111.5 kg 113.4 kg 113.8 kg    Examination:  General: No acute distress. Cardiovascular: Heart sounds show Talen Poser regular rate, and rhythm Lungs: coarse breath sounds, trach  Abdomen: Soft, nontender, nondistended Neurological: Alert and oriented 3. Moves all extremities 4. Cranial nerves II through XII grossly intact. Skin: Warm and dry. No rashes or lesions. Extremities: No clubbing or cyanosis. No edema.    Data Reviewed: I have personally reviewed following labs and imaging studies  CBC: Recent Labs  Lab 03/25/20 0243 03/26/20 0535 03/27/20 0616 03/28/20 0646 03/29/20 0515  WBC 9.7 7.6 8.5 7.9 7.0  NEUTROABS 7.3 5.2 6.4 5.7 4.8  HGB 13.3 12.9 13.0 12.3 12.1  HCT 40.9 40.8 40.0 38.0 37.7  MCV 96.9 99.3 98.3 98.7 99.0  PLT 159 167 158 152 149*    Basic Metabolic Panel: Recent Labs  Lab 03/25/20 0243 03/26/20 0535 03/27/20 0616 03/28/20 0646  03/29/20 0515  NA 138 137 140 139 138  K 3.9 4.0 4.4 4.1 4.4  CL 105 104 106 105 103  CO2 23 25 26 27 27   GLUCOSE 115* 141* 137* 134* 130*  BUN 25* 22* 17 17 19   CREATININE 0.84 0.90 0.88 0.95 0.97  CALCIUM 8.4* 8.4* 8.5* 8.4* 8.4*  MG 2.4 2.5* 2.4 2.3 2.3  PHOS 4.6 4.5 3.5 4.4 4.4    GFR: Estimated Creatinine Clearance: 77.6 mL/min (by C-G formula based on SCr of 0.97 mg/dL).  Liver Function Tests: Recent Labs  Lab 03/25/20 0243 03/26/20 0535 03/27/20 0616 03/28/20 0646 03/29/20 0515  AST 14* 15 14* 13* 14*  ALT 17 17 15 16 15   ALKPHOS 54 52 55 52 53  BILITOT 0.6 0.7 0.5 0.6 0.7  PROT 5.4* 5.3* 5.5* 5.4* 5.6*  ALBUMIN 2.8* 2.7* 2.7* 2.6* 2.7*    CBG: Recent Labs  Lab 03/28/20 2040 03/29/20 0001 03/29/20 0435 03/29/20 0736 03/29/20 1142  GLUCAP 111* 127* 117* 127* 78     Recent Results (from the past 240 hour(s))  Culture, respiratory (non-expectorated)     Status: None   Collection Time: 03/21/20  4:25 PM   Specimen: Tracheal Aspirate; Respiratory  Result Value Ref Range Status   Specimen Description   Final    TRACHEAL ASPIRATE Performed at [facility] [address], [place], [place] [address]    Special Requests   Final    NONE Performed at [facility], [place] [address], [place], [place] [address]    Gram Stain   Final    FEW WBC PRESENT, PREDOMINANTLY PMN FEW GRAM POSITIVE COCCI IN PAIRS IN CLUSTERS RARE GRAM NEGATIVE RODS    Culture   Final    Normal respiratory flora-no Staph aureus or Pseudomonas seen Performed at [facility], [address], [place],  [address]    Report Status 03/23/2020 FINAL  Final         Radiology Studies: DG CHEST PORT 1 VIEW  Result Date: 03/29/2020 CLINICAL DATA:  Dyspnea EXAM: PORTABLE CHEST 1 VIEW COMPARISON:  03/25/2020 FINDINGS: Tracheostomy unchanged. Pulmonary insufflation is symmetric though has decreased slightly since prior examination. Small  bilateral pleural effusions have developed with associated bibasilar atelectasis. No pneumothorax. Cardiac size within normal limits. Pulmonary vascularity is normal. No acute bone abnormality. IMPRESSION: Interval development of small bilateral pleural effusions with bibasilar compressive atelectasis. Electronically Signed   By: Fidela Salisbury MD  On: 03/29/2020 06:00        Scheduled Meds: . arformoterol  15 mcg Nebulization BID  . budesonide (PULMICORT) nebulizer solution  0.5 mg Nebulization BID  . chlorhexidine  15 mL Mouth Rinse BID  . Chlorhexidine Gluconate Cloth  6 each Topical Daily  . CISplatin  40 mg/m2 (Treatment Plan Recorded) Intravenous Once  . docusate  100 mg Per Tube BID  . enoxaparin (LOVENOX) injection  55 mg Subcutaneous Q24H  . feeding supplement  237 mL Oral BID BM  . feeding supplement (OSMOLITE 1.5 CAL)  237 mL Per Tube TID BM  . insulin aspart  0-9 Units Subcutaneous Q4H  . ipratropium-albuterol  3 mL Nebulization TID  . lidocaine  1 patch Transdermal Q24H  . mouth rinse  15 mL Mouth Rinse q12n4p  . multivitamin with minerals  1 tablet Per Tube Daily  . nicotine  21 mg Transdermal Daily  . pantoprazole sodium  40 mg Per Tube Daily  . scopolamine  1 patch Transdermal Q72H   Continuous Infusions: . sodium chloride       LOS: 19 days    Time spent: over 30 min    Fayrene Helper, MD [facility]   To contact the attending provider between 7A-7P or the covering provider during after hours 7P-7A, please log into the web site [email or web address] and access using universal [place] password for that web site. If you do not have the password, please call the hospital operator.  03/29/2020, 2:42 PM

## 2020-03-29 NOTE — Plan of Care (Signed)
  Problem: Clinical Measurements: Goal: Respiratory complications will improve Outcome: Progressing   Problem: Activity: Goal: Risk for activity intolerance will decrease Outcome: Progressing   Problem: Coping: Goal: Level of anxiety will decrease Outcome: Progressing   

## 2020-03-29 NOTE — Plan of Care (Signed)
  Problem: Education: Goal: Knowledge of the prescribed therapeutic regimen will improve Outcome: Progressing   Problem: Clinical Measurements: Goal: Will remain free from infection Outcome: Progressing   Problem: Bowel/Gastric: Goal: Occurrences of nausea will decrease Outcome: Not Applicable

## 2020-03-29 NOTE — Progress Notes (Signed)
HEMATOLOGY-ONCOLOGY PROGRESS NOTE  SUBJECTIVE:   Patient just returned from radiation. Using trach collar 10 L No pain Breathing stable. Denies nausea vomiting. She feels well overall and is ready to start treatment.  REVIEW OF SYSTEMS:    Constitutional: Denies fevers, chills Eyes: Denies blurriness of vision Ears, nose, mouth, throat, and face: Denies mucositis or sore throat Respiratory: No shortness of breath reported today. Oxygen needs stable. Cardiovascular: Denies palpitation, chest discomfort Gastrointestinal:  Denies nausea, heartburn or change in bowel habits Behavioral/Psych: Mood is stable, no new changes  Extremities: LE edema improving. All other systems were reviewed with the patient and are negative.  I have reviewed the past medical history, past surgical history, social history and family history with the patient and they are unchanged from previous note.   PHYSICAL EXAMINATION: ECOG PERFORMANCE STATUS: 3 - Symptomatic, >50% confined to bed  Vitals:   03/29/20 0600 03/29/20 0748  BP:    Pulse:    Resp: 17   Temp:    SpO2:  94%   Filed Weights   03/24/20 0500 03/28/20 0625 03/29/20 0500  Weight: 111.5 kg 113.4 kg 113.8 kg    Intake/Output from previous day: 11/28 0701 - 11/29 0700 In: -  Out: 1500 [Urine:1500]  GENERAL:alert, no distress and comfortable, on trach collar Venous stasis changes BLE Alert, oriented.  LABORATORY DATA:  I have reviewed the data as listed CMP Latest Ref Rng & Units 03/29/2020 03/28/2020 03/27/2020  Glucose 70 - 99 mg/dL 130(H) 134(H) 137(H)  BUN 6 - 20 mg/dL 19 17 17   Creatinine 0.44 - 1.00 mg/dL 0.97 0.95 0.88  Sodium 135 - 145 mmol/L 138 139 140  Potassium 3.5 - 5.1 mmol/L 4.4 4.1 4.4  Chloride 98 - 111 mmol/L 103 105 106  CO2 22 - 32 mmol/L 27 27 26   Calcium 8.9 - 10.3 mg/dL 8.4(L) 8.4(L) 8.5(L)  Total Protein 6.5 - 8.1 g/dL 5.6(L) 5.4(L) 5.5(L)  Total Bilirubin 0.3 - 1.2 mg/dL 0.7 0.6 0.5  Alkaline Phos  38 - 126 U/L 53 52 55  AST 15 - 41 U/L 14(L) 13(L) 14(L)  ALT 0 - 44 U/L 15 16 15     Lab Results  Component Value Date   WBC 7.0 03/29/2020   HGB 12.1 03/29/2020   HCT 37.7 03/29/2020   MCV 99.0 03/29/2020   PLT 149 (L) 03/29/2020   NEUTROABS 4.8 03/29/2020    CT SOFT TISSUE NECK W CONTRAST  Addendum Date: 03/11/2020   ADDENDUM REPORT: 03/11/2020 16:36 ADDENDUM: Study discussed by telephone with Dr. Jolaine Artist MEMON on 03/11/2020 at 1631 hours. Electronically Signed   By: Genevie Ann M.D.   On: 03/11/2020 16:36   Result Date: 03/11/2020 CLINICAL DATA:  59 year old female with dysphagia. Tonsillitis suspected. EXAM: CT NECK WITH CONTRAST TECHNIQUE: Multidetector CT imaging of the neck was performed using the standard protocol following the bolus administration of intravenous contrast. CONTRAST:  74mL OMNIPAQUE IOHEXOL 300 MG/ML  SOLN COMPARISON:  Chest CT 01/28/2019. FINDINGS: Pharynx and larynx: Bulky soft tissue mass throughout the bilateral supraglottic larynx and affecting the hypopharynx. Lobulated diffuse thickening of the epiglottis (series 2, image 53). Diffuse false cord and anterior commissure involvement with evidence of extension into the right paraglottic space on series 2 image 61 - where nodular direct extension of tumor and/or inseparable abnormal right level 3 lymph node protrudes on coronal image 58. Asymmetric erosion of the undersurface of the right thyroid cartilage on image 65. Asymmetric sclerosis of the right arytenoid on image  12., and midline extension of tumor is suspected through the anterior commissure a distance of about 11 mm as seen on series 2, image 67 and sagittal image 48. All told, tumor size is estimated at 37 x 52 by 45 mm (AP by transverse by CC). There is evidence of airway compromise. The true cords may be spared as seen on series 2, image 71. Subglottic larynx is within normal limits. Above the vallecula pharyngeal contours appear normal. Normal superior  parapharyngeal and retropharyngeal spaces. Salivary glands: Negative sublingual space. Submandibular glands and parotid glands remain within normal limits. Thyroid: Negative. Lymph nodes: Malignant left level IIIb lymph node measures 17 mm short axis and 29 mm long axis (series 2, image 55 and coronal image 73). As stated above it is possible of malignant right level IIIa lymph node is inseparable from the parent tumor along the right paraglottic space (coronal image 58). Smaller but asymmetrically enlarged left level 4 nodes measure up to 9 mm short axis (coronal image 72). No other abnormal or suspicious lymph nodes identified. Vascular: Major vascular structures in the neck and at the skull base remain patent including both internal jugular veins. The left vertebral artery appears dominant. Calcified atherosclerosis at the skull base. Limited intracranial: Negative. Visualized orbits: Negative. Mastoids and visualized paranasal sinuses: Clear. Skeleton: Absent and carious dentition. Cervical spine degeneration. No suspicious osseous lesion identified. 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 No superior mediastinal lymphadenopathy. Visible axillary lymph nodes are normal. Mild dependent atelectasis.  No upper lung nodule identified. IMPRESSION: 1. 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 Recommend ENT consultation. Involvement of right laryngeal cartilages, with extension in the midline anterior to the strap muscles, and also extension through the right paraglottic space and/or inseparable malignant right level IIIa lymph node. Estimated tumor long axis 5.2 cm. 2. Malignant contralateral left level 3b lymph node is 2.9 cm long axis. Smaller indeterminate left level 4 lymph nodes. 3. No distant metastatic disease identified in the neck or upper chest. Electronically Signed: By: Genevie Ann M.D. On: 03/11/2020 16:23   IR GASTROSTOMY TUBE MOD SED  Result Date:  03/22/2020 INDICATION: Head and neck malignancy, dysphagia EXAM: FLUOROSCOPIC 16 FRENCH BALLOON RETENTION GASTROSTOMY MEDICATIONS: 1 G VANCOMYCIN; Antibiotics were administered within 1 hour of the procedure. GLUCAGON 0.5 MG IV ANESTHESIA/SEDATION: Versed 0 mg IV; Fentanyl 50 mcg IV Moderate Sedation Time:  12 MINUTES The patient was continuously monitored during the procedure by the interventional radiology nurse under my direct supervision. CONTRAST:  10 CC-administered into the gastric lumen. FLUOROSCOPY TIME:  Fluoroscopy Time: 1 minutes 36 seconds (53 mGy). COMPLICATIONS: None immediate. PROCEDURE: Informed written consent was obtained from the patient after a thorough discussion of the procedural risks, benefits and alternatives. All questions were addressed. Maximal Sterile Barrier Technique was utilized including caps, mask, sterile gowns, sterile gloves, sterile drape, hand hygiene and skin antiseptic. A timeout was performed prior to the initiation of the procedure. Previous imaging reviewed. Patient has an existing feeding tube within the stomach. This was utilized to insufflate the stomach with air. The stomach was localized beneath the left subcostal margin in the left abdomen with biplane fluoroscopy. Overlying skin marked. Under sterile conditions and local anesthesia, introducer needle was advanced into the stomach. Contrast injection confirms percutaneous needle access within the stomach. Single T tack deployed for gastropexy. Amplatz guidewire inserted. Tract dilatation performed to insert the 16 French balloon retention gastrostomy. Balloon tip inflated with 7 cc mixture of saline and  1 cc contrast. This was retracted against the anterior gastric wall. Contrast injection confirms position in the stomach. Images obtained for documentation. T tacks secured to the skin site. A sterile dressing applied. No immediate complication. Patient tolerated the procedure well. IMPRESSION: Successful  fluoroscopic 19 French balloon retention gastrostomy Electronically Signed   By: Jerilynn Mages.  Shick M.D.   On: 03/22/2020 16:53   CT CHEST ABDOMEN PELVIS W CONTRAST  Result Date: 03/15/2020 CLINICAL DATA:  New diagnosis of head neck mass EXAM: CT CHEST, ABDOMEN, AND PELVIS WITH CONTRAST TECHNIQUE: Multidetector CT imaging of the chest, abdomen and pelvis was performed following the standard protocol during bolus administration of intravenous contrast. CONTRAST:  155mL OMNIPAQUE IOHEXOL 300 MG/ML  SOLN COMPARISON:  Prior swallowing evaluation and CT of the neck. FINDINGS: CT CHEST FINDINGS Cardiovascular: Calcified coronary artery disease. Aorta is normal caliber. 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 Central pulmonary vasculature is mildly engorged. Approximately 3 cm greatest caliber unchanged from previous exam. Unremarkable on limited venous phase assessment. Mediastinum/Nodes: Supraglottic mass partially imaged on the first image of the data set. Interval placement of tracheostomy tube with resultant pneumomediastinum in the anterior mediastinum in upper mediastinum. No adenopathy within the mediastinum or axilla. No hilar adenopathy. Rounded partial necrotic lymph node in the LEFT neck partially visualized, corresponding to level IIIb lymph node seen on the neck CT. Lungs/Pleura: Basilar consolidative changes bilaterally with similar pattern of consolidation though decreased volume loss when compared to the study of September of 2020. Airways are patent. Musculoskeletal: See below for full musculoskeletal detail. CT ABDOMEN PELVIS FINDINGS Hepatobiliary: Liver without focal lesion. Post cholecystectomy. No biliary duct dilation. Pancreas: Mild atrophy of the pancreas without focal lesion, ductal dilation or inflammation. Spleen: Spleen normal in size and contour. Adrenals/Urinary Tract: Adrenal glands are normal. Symmetric renal enhancement. No hydronephrosis. LEFT renal  cysts, largest arising from the lower pole measuring 2.8 x 2.9 cm. Urinary bladder under distended limiting assessment. Stomach/Bowel: Gastrointestinal tract with signs of colonic diverticulosis. No acute bowel process. Rectus diastasis. Postoperative changes in the midline of the abdomen related to prior ventral hernia repair. Mild bulging at the site of hernia repair, no herniation beyond the inserted mesh near the umbilicus. Vascular/Lymphatic: Calcified atheromatous plaque in the abdominal aorta. No aneurysmal dilation. There is no gastrohepatic or hepatoduodenal ligament lymphadenopathy. No retroperitoneal or mesenteric lymphadenopathy. No pelvic sidewall lymphadenopathy. Reproductive: No adnexal mass. Reproductive structures are unremarkable. Other: Post abdominal wall reconstruction with rectus diastasis, no frank hernia. Musculoskeletal: Spinal degenerative changes. Degenerative changes in glenohumeral joints and hips. Ununited fractures along posterior LEFT chest involving ribs 10 through 12. These appear subacute or chronic. These did not appear to be present on previous imaging from September of 2020. IMPRESSION: 1. Supraglottic mass partially imaged on the first image of the data set. No signs of metastatic disease to the chest, abdomen or pelvis. 2. Rounded partially necrotic lymph node in the LEFT neck partially visualized, corresponding to level IIIb lymph node seen on the neck CT. 3. Basilar consolidative changes bilaterally with similar pattern of consolidation though decreased volume loss when compared to the study of September of 2020. Findings may be related to aspiration. No definite lesions seen in the chest though these areas could obscure underlying lesions. 4. Postoperative changes with small amount of pneumomediastinum presumably related to recent tracheostomy tube insertion. 5. Ununited fractures along posterior LEFT chest involving ribs 10 through 12. These appear subacute or chronic.  These did not appear  to be present on previous imaging from September of 2020. 6. Post abdominal wall reconstruction with rectus diastasis, no frank hernia. 7. Aortic atherosclerosis. Aortic Atherosclerosis (ICD10-I70.0). Electronically Signed   By: Zetta Bills M.D.   On: 03/15/2020 10:53   DG CHEST PORT 1 VIEW  Result Date: 03/29/2020 CLINICAL DATA:  Dyspnea EXAM: PORTABLE CHEST 1 VIEW COMPARISON:  03/25/2020 FINDINGS: Tracheostomy unchanged. Pulmonary insufflation is symmetric though has decreased slightly since prior examination. Small bilateral pleural effusions have developed with associated bibasilar atelectasis. No pneumothorax. Cardiac size within normal limits. Pulmonary vascularity is normal. No acute bone abnormality. IMPRESSION: Interval development of small bilateral pleural effusions with bibasilar compressive atelectasis. Electronically Signed   By: Fidela Salisbury MD   On: 03/29/2020 06:00   DG CHEST PORT 1 VIEW  Result Date: 03/25/2020 CLINICAL DATA:  Hypoxia, shortness of breath, altered mental status history COPD, hypertension, history laryngeal carcinoma, tracheostomy, undergoing radiation therapy EXAM: PORTABLE CHEST 1 VIEW COMPARISON:  Portable exam 6644 hours compared to 03/23/2020 FINDINGS: Tracheostomy tube projects over tracheal air column. Normal heart size, mediastinal contours, and pulmonary vascularity. Improving opacity at LEFT lung base which could represent atelectasis or consolidation. Subsegmental atelectasis RIGHT middle lobe slightly improved. Tiny LEFT pleural effusion. Remaining lungs clear. No pneumothorax. Bones demineralized. IMPRESSION: Persistent subsegmental atelectasis at RIGHT base with improving atelectasis versus consolidation and tiny pleural effusion at LEFT base. Electronically Signed   By: Lavonia Dana M.D.   On: 03/25/2020 10:33   DG CHEST PORT 1 VIEW  Result Date: 03/23/2020 CLINICAL DATA:  Hypoxia. EXAM: PORTABLE CHEST 1 VIEW COMPARISON:  Left  19 2021. FINDINGS: Interim removal of feeding tube. Tracheostomy tube in stable position. Cardiomegaly. Persistent bibasilar atelectasis/infiltrates and small bilateral pleural effusions. Density noted over the right mid chest may represent atelectasis and or pleural fluid pseudotumor, no interim change. IMPRESSION: 1. Interim removal of feeding tube. Tracheostomy tube in stable position. 2. Stable cardiomegaly. 3. Persistent bibasilar atelectasis/infiltrates and small bilateral pleural effusions. Density of the right mid chest may represent atelectasis and or pleural fluid pseudotumor, no interim change. Electronically Signed   By: Marcello Moores  Register   On: 03/23/2020 06:39   DG CHEST PORT 1 VIEW  Result Date: 03/19/2020 CLINICAL DATA:  Pneumonia. EXAM: PORTABLE CHEST 1 VIEW COMPARISON:  03/18/2020. FINDINGS: Tracheostomy tube, feeding tube in stable position. Stable cardiomegaly. Improved pulmonary venous congestion. Lung volumes. Persistent left base atelectasis/infiltrate. Persistent right base subsegmental atelectasis. Elliptical density noted over the right mid chest may represent atelectasis and or fissural fluid. Pleural effusions cannot be excluded. No pneumothorax. IMPRESSION: 1. Lines and tubes in stable position. 2. Stable cardiomegaly. Improved pulmonary venous congestion. 3. Persistent left base atelectasis/infiltrate. Persistent right base subsegmental atelectasis. Elliptical density noted over the right mid chest may represent atelectasis and or fissural fluid. Small pleural effusions cannot be excluded. Electronically Signed   By: Marcello Moores  Register   On: 03/19/2020 06:03   DG Chest Port 1 View  Result Date: 03/18/2020 CLINICAL DATA:  Respiratory failure. EXAM: PORTABLE CHEST 1 VIEW COMPARISON:  03/17/2020. FINDINGS: Tracheostomy tube and feeding tube in stable position. Cardiomegaly. Mild pulmonary venous congestion. Mild bilateral interstitial prominence. Mild component of CHF cannot be  excluded. Persistent left lower lobe atelectasis/infiltrate. New onset right mid lung prominent atelectatic changes. No pneumothorax. IMPRESSION: 1. Tracheostomy tube and feeding tube in stable position. 2. Cardiomegaly with mild pulmonary venous congestion and mild bilateral interstitial prominence. Mild component of CHF cannot be excluded. 3. Persistent left lower lobe atelectasis/infiltrate.  New onset of right mid lung prominent atelectatic changes. Electronically Signed   By: Marcello Moores  Register   On: 03/18/2020 07:34   DG CHEST PORT 1 VIEW  Result Date: 03/17/2020 CLINICAL DATA:  Respiratory failure EXAM: PORTABLE CHEST 1 VIEW COMPARISON:  March 14, 2020 FINDINGS: Tracheostomy catheter tip is 6.2 cm above the carina. Feeding tube tip is below the diaphragm. No pneumothorax. There is a small left pleural effusion with consolidation in the left lower lobe. There is right base atelectasis with equivocal small right pleural effusion. Heart is mildly enlarged, stable, with pulmonary vascularity normal. No adenopathy. No bone lesions. IMPRESSION: Tube positions as described without pneumothorax. Left pleural effusion with equivocal right pleural effusion. Airspace opacity consistent with combination of atelectasis and pneumonia left lower lobe. Mild atelectasis right base. Stable cardiac prominence. Electronically Signed   By: Lowella Grip III M.D.   On: 03/17/2020 09:02   DG CHEST PORT 1 VIEW  Result Date: 03/14/2020 CLINICAL DATA:  Respiratory failure with hypercapnia EXAM: PORTABLE CHEST 1 VIEW COMPARISON:  March 10, 2020 FINDINGS: Tracheostomy catheter tip is 6.2 cm above the carina. There is ill-defined airspace opacity in the left upper lobe and left base regions with equivocal left pleural effusion. The right lung is clear. Heart is upper normal in size with pulmonary vascularity normal. No adenopathy. No bone lesions. IMPRESSION: Tracheostomy as described without pneumothorax. Patchy  airspace opacity consistent with scattered areas of pneumonia in the left upper lobe and left base. Equivocal left pleural effusion. Right lung clear. Stable cardiac silhouette. Electronically Signed   By: Lowella Grip III M.D.   On: 03/14/2020 09:17   DG Chest Portable 1 View  Result Date: 03/10/2020 CLINICAL DATA:  Shortness of breath and cough EXAM: PORTABLE CHEST 1 VIEW COMPARISON:  January 23, 2020 FINDINGS: There is bibasilar atelectatic change. Elsewhere the interstitium is mildly thickened. No consolidation. Heart size and pulmonary vascularity are normal. No adenopathy. There is degenerative change in each shoulder. IMPRESSION: Bibasilar atelectasis. Interstitial thickening likely represents a degree of underlying chronic bronchitis. No edema or airspace opacity. Heart size within normal limits. Electronically Signed   By: Lowella Grip III M.D.   On: 03/10/2020 12:42   DG Swallowing Func-Speech Pathology  Result Date: 03/27/2020 Objective Swallowing Evaluation: Type of Study: MBS-Modified Barium Swallow Study  Patient Details Name: Veronica Horton MRN: [id number] Date of Birth: February 02, 1961 Today's Date: 03/27/2020 Time: SLP Start Time (ACUTE ONLY): 9678 -SLP Stop Time (ACUTE ONLY): 1235 SLP Time Calculation (min) (ACUTE ONLY): 20 min Past Medical History: Past Medical History: Diagnosis Date . Bronchitis  . Class 3 obesity 01/23/2020 . COPD (chronic obstructive pulmonary disease) ([place])  . Degenerative disc disease, lumbar  . Hiatal hernia  . Hypertension  Past Surgical History: Past Surgical History: Procedure Laterality Date . CHOLECYSTECTOMY   . degenerative bone disease   . DIRECT LARYNGOSCOPY N/A 03/13/2020  Procedure: DIRECT LARYNGOSCOPY WITH BIOPSY;  Surgeon: Jason Coop, DO;  Location: [place];  Service: ENT;  Laterality: N/A; . INCISIONAL HERNIA REPAIR N/A 01/15/2019  Procedure: [name] HERNIORRHAPHY WITH MESH;  Surgeon: [name], MD;  Location: [facility];  Service:  General;  Laterality: N/A; . IR GASTROSTOMY TUBE MOD SED  03/22/2020 . OMENTECTOMY N/A 01/15/2019  Procedure: OMENTECTOMY;  Surgeon: [name], MD;  Location: [facility];  Service: General;  Laterality: N/A; . TRACHEOSTOMY TUBE PLACEMENT N/A 03/13/2020  Procedure: AWAKE TRACHEOSTOMY;  Surgeon: Jason Coop, DO;  Location: [place];  Service: ENT;  Laterality: N/A; HPI: Veronica Horton is a 59 y/o F with history of COPD,chronic respiratory failure on home oxygen at 5 L/min, with large laryngeal mass lesion, now s/p tracheostomy with Shiley 6-0 and direct laryngoscopy and biopsy. 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  Pt is also s/p PEG for nutrition.  Plans are for chemo and radiation concurrently starting while in hospital.  Prior medical history includes smoking and some dyspnea if walking long distanced.  Subjective: pt awake in chair Assessment / Plan / Recommendation CHL IP CLINICAL IMPRESSIONS 03/27/2020 Clinical Impression Pt continues with with mild pharyngeal phase dysphagia - obstructive due to mass impacting epiglottic deflection.  In addition, she requires extra time to masticate solids due to dentition.  Swallow trigger is timely with minimal laryngeal penetration of liquid into larynx during the swallow due to decreased epiglottic deflection.  Min vallecular residue with solids without pt awareness - liquid wash assisted to clear.  Pt with prominent cricopharyngeus and appears with trace backflow near this region but view was suboptimal.  Recommend D3/mech soft and thin liquids, po medications whole in puree and follow with liquid wash.  Of note, following testing, pt did cough and slight whitish coating noted on secretions but no aspiration noted during testing.  Suspect pt has some low grade chronic secretion aspiration. SLP Visit Diagnosis Dysphagia, oropharyngeal phase (R13.12) Attention and concentration deficit following -- Frontal lobe and executive function deficit  following -- Impact on safety and function Mild aspiration risk   CHL IP TREATMENT RECOMMENDATION 03/27/2020 Treatment Recommendations Therapy as outlined in treatment plan below   Prognosis 03/27/2020 Prognosis for Safe Diet Advancement Fair Barriers to Reach Goals Other (Comment) Barriers/Prognosis Comment -- CH[facility] IP DIET RECOMMENDATION 03/27/2020 SLP Diet Recommendations Dysphagia 3 (Mech soft) solids;Thin liquid Liquid Administration via -- Medication Administration Whole meds with puree Compensations Slow rate;Small sips/bites Postural Changes --   CHL IP OTHER RECOMMENDATIONS 03/27/2020 Recommended Consults (No Data) Oral Care Recommendations Oral care BID Other Recommendations Clarify dietary restrictions   [facility] IP FOLLOW UP RECOMMENDATIONS 03/27/2020 Follow up Recommendations Skilled Nursing facility   [facility] IP FREQUENCY AND DURATION 03/27/2020 Speech Therapy Frequency (ACUTE ONLY) min 1 x/week Treatment Duration 1 week      CHL IP ORAL PHASE 03/27/2020 Oral Phase WFL Oral - Pudding Teaspoon -- Oral - Pudding Cup -- Oral - Honey Teaspoon -- Oral - Honey Cup -- Oral - Nectar Teaspoon -- Oral - Nectar Cup -- Oral - Nectar Straw -- Oral - Thin Teaspoon -- Oral - Thin Cup -- Oral - Thin Straw -- Oral - Puree -- Oral - Mech Soft -- Oral - Regular -- Oral - Multi-Consistency -- Oral - Pill -- Oral Phase - Comment --  CHL IP PHARYNGEAL PHASE 03/27/2020 Pharyngeal Phase -- Pharyngeal- Pudding Teaspoon -- Pharyngeal -- Pharyngeal- Pudding Cup -- Pharyngeal -- Pharyngeal- Honey Teaspoon -- Pharyngeal -- Pharyngeal- Honey Cup -- Pharyngeal -- Pharyngeal- Nectar Teaspoon -- Pharyngeal -- Pharyngeal- Nectar Cup -- Pharyngeal -- Pharyngeal- Nectar Straw Reduced epiglottic inversion;Pharyngeal residue - valleculae Pharyngeal -- Pharyngeal- Thin Teaspoon Reduced epiglottic inversion;Pharyngeal residue - valleculae Pharyngeal -- Pharyngeal- Thin Cup Pharyngeal residue - valleculae;Reduced epiglottic  inversion;Penetration/Aspiration during swallow Pharyngeal Material enters airway, remains ABOVE vocal cords then ejected out Pharyngeal- Thin Straw Reduced epiglottic inversion;Pharyngeal residue - valleculae;Penetration/Aspiration during swallow Pharyngeal Material enters airway, remains ABOVE vocal cords then ejected out Pharyngeal- Puree WFL Pharyngeal -- Pharyngeal- Mechanical Soft -- Pharyngeal -- Pharyngeal- Regular Reduced epiglottic inversion Pharyngeal -- Pharyngeal- Multi-consistency --  Pharyngeal -- Pharyngeal- Pill NT Pharyngeal -- Pharyngeal Comment --  CHL IP CERVICAL ESOPHAGEAL PHASE 03/27/2020 Cervical Esophageal Phase -- Pudding Teaspoon -- Pudding Cup -- Honey Teaspoon -- Honey Cup -- Nectar Teaspoon -- Nectar Cup -- Nectar Straw -- Thin Teaspoon -- Thin Cup Prominent cricopharyngeal segment Thin Straw -- Puree -- Mechanical Soft -- Regular -- Multi-consistency -- Pill -- Cervical Esophageal Comment appearance of potential minimal backflow of liquid near UES region = suboptimal view - did not backflow into pharynx/larynx [name], MS [facility] SLP Acute Rehab Services Office [phone] Pager [phone] [name] 03/27/2020, 2:31 PM              DG Swallowing Func-Speech Pathology  Result Date: 03/11/2020 Objective Swallowing Evaluation: Type of Study: MBS-Modified Barium Swallow Study  Patient Details Name: Veronica Horton MRN: [id number] Date of Birth: 1961/01/24 Today's Date: 03/11/2020 Time: SLP Start Time (ACUTE ONLY): 5364 -SLP Stop Time (ACUTE ONLY): 1316 SLP Time Calculation (min) (ACUTE ONLY): 21 min Past Medical History: Past Medical History: Diagnosis Date . Bronchitis  . Class 3 obesity 01/23/2020 . COPD (chronic obstructive pulmonary disease) ([place])  . Degenerative disc disease, lumbar  . Hiatal hernia  . Hypertension  Past Surgical History: Past Surgical History: Procedure Laterality Date . CHOLECYSTECTOMY   . degenerative bone disease   . INCISIONAL HERNIA REPAIR N/A  01/15/2019  Procedure: [name] HERNIORRHAPHY WITH MESH;  Surgeon: [name], MD;  Location: [facility];  Service: General;  Laterality: N/A; . OMENTECTOMY N/A 01/15/2019  Procedure: OMENTECTOMY;  Surgeon: [name], MD;  Location: [facility];  Service: General;  Laterality: N/A; HPI: Astella Desir  is a 59 y.o. female, with medical history significant of class III obesity, hiatal hernia, hypertension, bronchitis, COPD, chronic respiratory failure on home oxygen at 5 LPM, active smoker of 1-1/2 packs of cigarettes per day , patient presents to ED secondary to shortness of breath and altered mental status, patient with receptive worsening dyspnea over the last 4 days, she is not vaccinated against Covid, she is altered at the time of my examination, history was obtained from boyfriend at bedside, and ED staff, patient still smoking, she still using 5 L of oxygen, he is with increased work of breathing, significantly dyspneic, upon presentation to ED she is altered not provide any complaints at this point. BSE requested.  Subjective: "I haven't been able to swallow my pills for a couple weeks." Assessment / Plan / Recommendation CHL IP CLINICAL IMPRESSIONS 03/11/2020 Clinical Impression Pt presents with mild pharyngeal phase dysphagia, however appearance of anatomy appears edematous (epiglottis and aryepiglottic folds). Pt with limited dentition and requires extra time to masticate solids, swallow trigger generally at the level of the valleculae, Pt with blunted appearance of epiglottis with reduced deflection resulting in variable trace, flash penetration of thins during the swallow without aspiration and min vallecular residue with solids and brief stasis of barium tablet in valleculae. Pt with prominent cricopharyngeus. Recommend D3/mech soft and thin liquids, po medications whole in puree and follow with liquid wash or per Pt preference. Also strongly recommend additional imaging (neck CT) and ENT consult pending  those results. SLP will follow pending results of imaging. Above to RN and MD.  SLP Visit Diagnosis Dysphagia, oropharyngeal phase (R13.12) Attention and concentration deficit following -- Frontal lobe and executive function deficit following -- Impact on safety and function Mild aspiration risk   CHL IP TREATMENT RECOMMENDATION 03/11/2020 Treatment Recommendations No treatment recommended at this time   Prognosis 03/11/2020  Prognosis for Safe Diet Advancement Fair Barriers to Reach Goals Severity of deficits Barriers/Prognosis Comment -- CHL IP DIET RECOMMENDATION 03/11/2020 SLP Diet Recommendations Dysphagia 3 (Mech soft) solids;Thin liquid Liquid Administration via Cup;Straw Medication Administration Whole meds with puree Compensations Slow rate;Small sips/bites Postural Changes Remain semi-upright after after feeds/meals (Comment);Seated upright at 90 degrees   CHL IP OTHER RECOMMENDATIONS 03/11/2020 Recommended Consults Consider ENT evaluation Oral Care Recommendations Oral care BID Other Recommendations Clarify dietary restrictions   CHL IP FOLLOW UP RECOMMENDATIONS 03/11/2020 Follow up Recommendations None   CHL IP FREQUENCY AND DURATION 03/11/2020 Speech Therapy Frequency (ACUTE ONLY) min 2x/week Treatment Duration 1 week      CHL IP ORAL PHASE 03/11/2020 Oral Phase WFL Oral - Pudding Teaspoon -- Oral - Pudding Cup -- Oral - Honey Teaspoon -- Oral - Honey Cup -- Oral - Nectar Teaspoon -- Oral - Nectar Cup -- Oral - Nectar Straw -- Oral - Thin Teaspoon -- Oral - Thin Cup -- Oral - Thin Straw -- Oral - Puree -- Oral - Mech Soft -- Oral - Regular -- Oral - Multi-Consistency -- Oral - Pill -- Oral Phase - Comment --  CHL IP PHARYNGEAL PHASE 03/11/2020 Pharyngeal Phase Impaired Pharyngeal- Pudding Teaspoon -- Pharyngeal -- Pharyngeal- Pudding Cup -- Pharyngeal -- Pharyngeal- Honey Teaspoon -- Pharyngeal -- Pharyngeal- Honey Cup -- Pharyngeal -- Pharyngeal- Nectar Teaspoon -- Pharyngeal -- Pharyngeal- Nectar Cup  -- Pharyngeal -- Pharyngeal- Nectar Straw Pharyngeal residue - valleculae;Reduced epiglottic inversion Pharyngeal -- Pharyngeal- Thin Teaspoon Delayed swallow initiation-vallecula;Reduced epiglottic inversion Pharyngeal -- Pharyngeal- Thin Cup Reduced epiglottic inversion;Penetration/Aspiration during swallow Pharyngeal Material enters airway, remains ABOVE vocal cords then ejected out Pharyngeal- Thin Straw Reduced epiglottic inversion;Penetration/Aspiration during swallow Pharyngeal Material enters airway, remains ABOVE vocal cords then ejected out Pharyngeal- Puree WFL Pharyngeal -- Pharyngeal- Mechanical Soft -- Pharyngeal -- Pharyngeal- Regular Pharyngeal residue - valleculae;Reduced epiglottic inversion Pharyngeal -- Pharyngeal- Multi-consistency -- Pharyngeal -- Pharyngeal- Pill Pharyngeal residue - valleculae;Reduced epiglottic inversion Pharyngeal -- Pharyngeal Comment edematous pharynx  CHL IP CERVICAL ESOPHAGEAL PHASE 03/11/2020 Cervical Esophageal Phase Impaired Pudding Teaspoon -- Pudding Cup -- Honey Teaspoon -- Honey Cup -- Nectar Teaspoon -- Nectar Cup -- Nectar Straw -- Thin Teaspoon -- Thin Cup Prominent cricopharyngeal segment Thin Straw -- Puree -- Mechanical Soft -- Regular -- Multi-consistency -- Pill -- Cervical Esophageal Comment -- Thank you, [name], [place] 03/11/2020, 2:12 PM              ECHOCARDIOGRAM COMPLETE  Result Date: 03/11/2020    ECHOCARDIOGRAM REPORT   Patient Name:   Veronica Horton Date of Exam: 03/11/2020 Medical Rec #:  [id number]       Height:       65.0 in Accession #:    [id number]      Weight:       244.0 lb Date of Birth:  10/26/1960       BSA:          2.153 m Patient Age:    40 years        BP:           115/35 mmHg Patient Gender: F               HR:           57 bpm. Exam Location:  [facility] Procedure: 2D Echo Indications:    Dyspnea 786.09 / R06.00  History:        Patient has prior history of Echocardiogram  examinations,  most                 recent 01/16/2019. COPD; Risk Factors:Hypertension and Current                 Smoker. Acute respiratory failure with hypoxia.  Sonographer:    Leavy Cella RDCS (AE) Referring Phys: [id number] DAWOOD S [place] IMPRESSIONS  1. Left ventricular ejection fraction, by estimation, is 70 to 75%. The left ventricle has hyperdynamic function. The left ventricle has no regional wall motion abnormalities. There is mild left ventricular hypertrophy. Left ventricular diastolic parameters were normal.  2. Right ventricular systolic function is normal. The right ventricular size is normal. Tricuspid regurgitation signal is inadequate for assessing PA pressure.  3. The mitral valve is grossly normal. Trivial mitral valve regurgitation.  4. The aortic valve is tricuspid. Aortic valve regurgitation is not visualized.  5. The inferior vena cava is normal in size with greater than 50% respiratory variability, suggesting right atrial pressure of 3 mmHg. FINDINGS  Left Ventricle: Left ventricular ejection fraction, by estimation, is 70 to 75%. The left ventricle has hyperdynamic function. The left ventricle has no regional wall motion abnormalities. The left ventricular internal cavity size was normal in size. There is mild left ventricular hypertrophy. Left ventricular diastolic parameters were normal. Right Ventricle: The right ventricular size is normal. No increase in right ventricular wall thickness. Right ventricular systolic function is normal. Tricuspid regurgitation signal is inadequate for assessing PA pressure. Left Atrium: Left atrial size was normal in size. Right Atrium: Right atrial size was normal in size. Pericardium: There is no evidence of pericardial effusion. Mitral Valve: The mitral valve is grossly normal. Trivial mitral valve regurgitation. Tricuspid Valve: The tricuspid valve is grossly normal. Tricuspid valve regurgitation is trivial. Aortic Valve: The aortic valve is tricuspid. Aortic valve  regurgitation is not visualized. Pulmonic Valve: The pulmonic valve was not well visualized. Pulmonic valve regurgitation is not visualized. Aorta: The aortic root is normal in size and structure. Venous: The inferior vena cava is normal in size with greater than 50% respiratory variability, suggesting right atrial pressure of 3 mmHg. IAS/Shunts: No atrial level shunt detected by color flow Doppler.  LEFT VENTRICLE PLAX 2D LVIDd:         4.05 cm  Diastology LVIDs:         2.30 cm  LV e' medial:    8.59 cm/s LV PW:         1.35 cm  LV E/e' medial:  12.2 LV IVS:        1.34 cm  LV e' lateral:   11.00 cm/s LVOT diam:     2.00 cm  LV E/e' lateral: 9.5 LVOT Area:     3.14 cm  RIGHT VENTRICLE RV S prime:     15.40 cm/s TAPSE (M-mode): 2.9 cm LEFT ATRIUM             Index       RIGHT ATRIUM           Index LA diam:        4.70 cm 2.18 cm/m  RA Area:     13.50 cm LA Vol (A2C):   73.3 ml 34.05 ml/m RA Volume:   35.40 ml  16.44 ml/m LA Vol (A4C):   39.3 ml 18.26 ml/m LA Biplane Vol: 55.6 ml 25.83 ml/m   AORTA Ao Root diam: 2.70 cm MITRAL VALVE MV Area (PHT): 2.69 cm     SHUNTS MV  Decel Time: 282 msec     Systemic Diam: 2.00 cm MV E velocity: 105.00 cm/s MV A velocity: 57.80 cm/s MV E/A ratio:  1.82 Rozann Lesches MD Electronically signed by Rozann Lesches MD Signature Date/Time: 03/11/2020/4:59:31 PM    Final     ASSESSMENT AND PLAN: 59 year old Caucasian female, with medical history of heavy smoking, COPD on home oxygen 5 L/min, hypertension, presents with worsening dyspnea and altered mental status.  Work-up showed a large laryngeal mass.  Patient required urgent tracheostomy.  1.  Supraglottic laryngeal cancer, cT4aN2bMx 2. COPD with acute exacerbation, on chronic home oxygen 5 L/min 3. HTN  4.  Heavy smoking history  Recommendations:  -She has T4aN2c supraglottic laryngeal cancer, with compromised airway, required urgent tracheostomy.  -For staging scan, we ideally would like to get a PET scan.   However this is not feasible while she is in hospital, and there is quite bit social difficult to get her treated as outpt (she lives an hour away and has no transportation). CT C/A/P performed did not show evidence of distant mets.  - Ideally for T4a tumors, total laryngectomy with bilateral neck dissection is the way to go but patient refused surgery. I did explain to her if patient declines surgery, we can proceed with concurrent chemo radiation although it is likely that she may need salvage surgery if she has incomplete response. Given baseline PS, she is not an ideal candidate for every 21 day cisplatin, hence we will proceed with weekly cisplatin. -Informed consent obtained. I have discussed adverse effects from cisplatin including but not limited to fatigue, nausea, vomiting, increased risk of infections which can be fatal occasionally, ototoxicity resulting in permanent hearing loss at times, nephrotoxicity which can occasionally be permanent. She understands that the treatment can be tough and life threatening rarely but she agrees to proceed PEG for feeding. -Labs from this morning have been reviewed.  There are no contraindications to beginning chemoradiation today. -Discharge planning-disposition to be determined.  Mikey Bussing

## 2020-03-29 NOTE — Progress Notes (Signed)
NAME:  Veronica Horton, MRN:  [id number], DOB:  08-06-1960, LOS: 40 ADMISSION DATE:  03/10/2020, CONSULTATION DATE:  03/29/20 REFERRING MD:  Hospitalist, CHIEF COMPLAINT:  Respiratory failure   Brief History   59 y.o. F with PMH of COPD 11/10 with shortness of breath found to have laryngeal mass concerning for malignancy s/p biopsy and tracheostomy.  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  She was transferred to the intensive care unit post biopsy as she required ventilator support.  Tx to [facility] on 11/17 for ONC therapy.   Past Medical History  COPD, Bronchitis,Class III Obesity ,DJD,Hiatal Leslie Hospital Events   11/10 admit to hospitalists with SOB 11/13 laryngoscopy and biopsy with tracheostomy 11/15 transferred to intensive care unit 11/15 transition to trach collar 11/17 Tx to [facility] for ONC therapy. 24 hours on ATC.  11/22 G Tube with IR  11/23 starting EN   Consults:  ENT Oncology PCCM  Procedures:  ENT Trach 11/13 > IR GTube 11/22>   Significant Diagnostic Tests:  11/11 CT soft tissue neck with contrast >> Bulky bilateral supraglottic tumor with possible airway Compromise, Involvement of right laryngeal cartilages, with extension in the midline anterior to the strap muscles, and also extension through the right paraglottic space and/or inseparable malignant right level IIIa lymph node. 11/15 CT chest/abdomen/pelvis >> no signs of metastatic disease, rounded partially necrotic lymph node to the left neck, basilar consolidative changes similar to prior, small pneumomediastinum  Micro Data:  COVID 11/10 >> negative Influenza 11/10 >> negative  MRSA PCR 11/10 >>  Sputum 11/14 >> serratia >> R-cefazolin, S-ceftriaxone Trach aspirate 11/21> normal flora   Antimicrobials:  Azithro 11/10 x1  Clinda 11/10 x1  Doxycycline 11/10 >> 11/14  Levofloxacin 11/14 >> 11/19  Interim history/subjective:  No complaints.  Breathing comfortably on 60% ATC.   Objective   Blood pressure (!) 106/52, pulse 72, temperature 99.6 F (37.6 C), temperature source Oral, resp. rate 20, height 5\' 5"  (1.651 m), weight 113.8 kg, SpO2 90 %.    FiO2 (%):  [60 %] 60 %   Intake/Output Summary (Last 24 hours) at 03/29/2020 1517 Last data filed at 03/29/2020 1334 Gross per 24 hour  Intake 120 ml  Output 2400 ml  Net -2280 ml   Filed Weights   03/24/20 0500 03/28/20 0625 03/29/20 0500  Weight: 111.5 kg 113.4 kg 113.8 kg    Exam: General: Chronically ill F, older adult. Reclined in bed NAD  HENT: #6 cuffed trach secure. Small amount of thick tan secretions. Trach collar 60%  Pulm: Symmetrical chest expansion, even unlabored respirations. Diminished bibasilar sounds. Scattered rhonchi, less than prior.  CV: rrr s1s2. Cap refill brisk   GI: Obese soft round. G Tube in place. + bowel sounds  Ext: BLE chronic darkened discoloration over shins. No obvious joint deformity. No cyanosis or clubbing  Neuro: Awake alert oriented. 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 No intubation from above due to airway occlusion.  CXR 11/29 with bilateral effusions  Plan - wean O2 as able for goal 88-95% - I have turned oxygen down  to 40% on trach collar, which is essentially her 5L home O2 and she is maintaining sats > 88% - Continue current nebs - Recommend brovana and yupelri at discharge. She will not be able to use inhalers at discharge due to upper airway occlusion. - Cannot use PMV due to airway obstruction - Cont pulm hygiene, mobility,  CPT, routine trach care - Aspiration precautions  - Agree with Lasix today, track I&O   Supraglottic squamous cell laryngeal cancer (T4aN2cMX) with airway compromise, s/p laryngoscopy with biopsy & tracheostomy  -appreciate ENT, ONC input  Plan -plan is chemo and xrt concurrently  -routine trach care   Best practice:  Diet:EN via G tube  Pain/Anxiety/Delirium protocol (if indicated): Fentanyl prn VAP protocol (if indicated): HOB elevated   DVT prophylaxis: Lovenox GI prophylaxis: PPI Glucose control: SSI Mobility: PT/OT, continue efforts at advancement  Code Status: Full code Family Communication: pt updated 11/29 Disposition: per primary -- PCCM following for trach   Georgann Housekeeper, AGACNP-BC [place] for personal pager PCCM on call pager [phone]  03/29/2020 3:35 PM

## 2020-03-29 NOTE — Progress Notes (Signed)
Patient due for Cisplatin chemotherapy today. Reinforced teaching of chemotherapy side effects and patient demonstrated understanding. Verified chemotherapy consent was signed and in shadow chart. Patient voided total of 1600 cc urine prior to Cisplatin administration. Cisplatin dosage, total VTBI, pharmacy signatures, expiration date/time, and patient identifiers independently verified by me and by Lottie Dawson, RN.

## 2020-03-29 NOTE — Progress Notes (Signed)
Nutrition Follow-up  DOCUMENTATION CODES:   Morbid obesity  INTERVENTION:   Begin transition to bolus feed regimen: -237 ml Osmolite 1.5 TID via PEG -Flush with 60 ml before and after each bolus (360 ml total)  -Ensure Plus BID PO, each provides 350 kcals and 13g protein  -Encouraged PO intakes of meals  *If patient able to take in PO, goal for bolus feeds will be 237 ml Osmolite 1.5 QID.  **If unable to tolerate PO intakes, 100% of nutritional needs will be met with 237 ml Osmolite 1.5 -6 times daily.  NUTRITION DIAGNOSIS:   Increased nutrient needs related to cancer and cancer related treatments, chronic illness as evidenced by estimated needs.  Ongoing.  GOAL:   Patient will meet greater than or equal to 90% of their needs  Progressing.  MONITOR:   TF tolerance, Diet advancement, Labs, Weight trends  REASON FOR ASSESSMENT:   Consult Assessment of nutrition requirement/status  ASSESSMENT:   59 year old female with history significant of hiatal hernia, HTN, bronchitis, COPD, and chronic respiratory failure on 5 L oxygen at night, presents with AMS and worsening dyspnea over the last 4 days.  11/11 MBS by SLP with abnormal larynx, CT neck with supraglottic mass 11/13 Direct laryngoscopy with biopsy and trach 11/14 intubated 11/15 CT chest/abdomen pelvis with no signs of metastatic disease, partially necrotic lymph node to left neck, Cortrak placed, transitioned to trach collar 11/17 transferred to [facility] for chemoradiation 11/22: PEG placement 11/27: MBS: SLP recommending D3 diet with thin liquids  Patient in room with RN and tech. Pt expected to begin chemotherapy today. Had radiation this morning.   Patient states she is eating fine and is tolerating her TF well at 60 ml/hr. Pt is able to communicate via writing. Explained TF options for discharge and pt states she would like to try transition to bolus feeds in addition to eating meals and drinking supplements.  Ordered Ensure for pt to try. Per nurse tech in room, pt did not eat that much of her breakfast meal this morning but she will document how much pt ate of her lunch tray which was ordered for 1300.  4 cartons of Osmolite 1.5 via PEG and 2 oral Ensure supplements a day would provide pt 2120 kcals (100% of needs) and 85g protein (100% of needs).  Admission weight: 244 lbs. Current weight: 250 lbs.  Medications: Colace, IV Lasix, Multivitamin with minerals daily  Labs reviewed:  CBGs: 78-127 Mg/Phos WNL  Diet Order:   Diet Order            DIET DYS 3 Room service appropriate? Yes; Fluid consistency: Thin  Diet effective now                 EDUCATION NEEDS:   Not appropriate for education at this time  Skin:  Skin Assessment: Skin Integrity Issues: Skin Integrity Issues:: Other (Comment) Other: Cellulits; LLE; MASD; R thigh  Last BM:  11/13  Height:   Ht Readings from Last 1 Encounters:  03/14/20 $RemoveB'5\' 5"'QxFSZCfl$  (1.651 m)    Weight:   Wt Readings from Last 1 Encounters:  03/29/20 113.8 kg   BMI:  Body mass index is 41.75 kg/m.  Estimated Nutritional Needs:   Kcal:  2100-2300  Protein:  85-100g  Fluid:  2L/day  Clayton Bibles, MS, RD, LDN Inpatient Clinical Dietitian Contact information available via Amion

## 2020-03-29 NOTE — TOC Progression Note (Addendum)
Transition of Care [facility]) - Progression Note    Patient Details  Name: Veronica Horton MRN: [id number] Date of Birth: [birth year]  [facility] CM/SW Contact  Deundra Furber, [name], RN Phone Number: 03/29/2020, 1:47 PM  Clinical Narrative: Patient has PEG-TF-recc bolus for home;On 02-high flow;medicaid pending. Unable to leave vm w/contact person [name] listed sister [phone]. PT recc HHPT. No HHC agency to accept currently for charity. Will continue to follow.  5p-patient d/c plan home. Portable suction w/catheters,yankauers ordered.CIR not appropriate for d/c plan. 10:29a-Patient will stay w/Aunt-Eveline  Bass:[address] [place] [address];The contact [place] [phone].     Expected Discharge Plan: Skilled Nursing Facility Barriers to Discharge: Continued Medical Work up  Expected Discharge Plan and Services Expected Discharge Plan: [place] In-house Referral: Development worker, community Discharge Planning Services: CM Consult Post Acute Care Choice: Durable Medical Equipment                   DME Arranged: Trach supplies DME Agency: [employer] Date DME Agency Contacted: 03/22/20 Time DME Agency Contacted: 1229 Representative spoke with at DME Agency: [email or web address]             Social Determinants of Health ([place]) Interventions    Readmission Risk Interventions Readmission Risk Prevention Plan 01/20/2019  Medication Screening Complete  Transportation Screening Complete  Some recent data might be hidden

## 2020-03-29 NOTE — Progress Notes (Signed)
Patient tolerated chemotherapy with no issues.

## 2020-03-29 NOTE — Progress Notes (Signed)
Placed on new ATC- uneventful.

## 2020-03-29 NOTE — Progress Notes (Signed)
Oncology Nurse Navigator Documentation  To provide support, encouragement and care continuity, met with [name] for her initial RT.  She is currently an inpatient and I assisted her transport to [place] [facility] from 1407.   I reviewed the 2-step treatment process, answered questions.   [name] completed treatment without difficulty, denied questions/concerns.  I encouraged them to call me with questions/concerns as treatments proceed.  She saw Dr. Isidore Moos for her weekly PUT as well.   She will receive her first Chemotherapy today as an inpatient.   I will follow her inpatient stay for discharge to help arrange transportation to and from the [place] for her daily radiation treatments and weekly chemotherapy treatments.    Harlow Asa RN, BSN, OCN Head & Neck Oncology Nurse [place] at [facility] Phone # [phone]  Fax # [phone]

## 2020-03-30 ENCOUNTER — Ambulatory Visit
Admit: 2020-03-30 | Discharge: 2020-03-30 | Disposition: A | Discharge status: Home / Self Care | Payer: Medicaid Other | Attending: Radiation Oncology | Admitting: Radiation Oncology

## 2020-03-30 LAB — CBC WITH DIFFERENTIAL/PLATELET
Abs Immature Granulocytes: 0.04 10*3/uL (ref 0.00–0.07)
Basophils Absolute: 0 10*3/uL (ref 0.0–0.1)
Basophils Relative: 0 %
Eosinophils Absolute: 0 10*3/uL (ref 0.0–0.5)
Eosinophils Relative: 0 %
HCT: 41.1 % (ref 36.0–46.0)
Hemoglobin: 13.3 g/dL (ref 12.0–15.0)
Immature Granulocytes: 0 %
Lymphocytes Relative: 4 %
Lymphs Abs: 0.4 10*3/uL — ABNORMAL LOW (ref 0.7–4.0)
MCH: 31.7 pg (ref 26.0–34.0)
MCHC: 32.4 g/dL (ref 30.0–36.0)
MCV: 97.9 fL (ref 80.0–100.0)
Monocytes Absolute: 0.2 10*3/uL (ref 0.1–1.0)
Monocytes Relative: 2 %
Neutro Abs: 9.1 10*3/uL — ABNORMAL HIGH (ref 1.7–7.7)
Neutrophils Relative %: 94 %
Platelets: 169 10*3/uL (ref 150–400)
RBC: 4.2 MIL/uL (ref 3.87–5.11)
RDW: 14.5 % (ref 11.5–15.5)
WBC: 9.8 10*3/uL (ref 4.0–10.5)
nRBC: 0 % (ref 0.0–0.2)

## 2020-03-30 LAB — COMPREHENSIVE METABOLIC PANEL
ALT: 16 U/L (ref 0–44)
AST: 17 U/L (ref 15–41)
Albumin: 2.9 g/dL — ABNORMAL LOW (ref 3.5–5.0)
Alkaline Phosphatase: 56 U/L (ref 38–126)
Anion gap: 8 (ref 5–15)
BUN: 26 mg/dL — ABNORMAL HIGH (ref 6–20)
CO2: 27 mmol/L (ref 22–32)
Calcium: 8.9 mg/dL (ref 8.9–10.3)
Chloride: 103 mmol/L (ref 98–111)
Creatinine, Ser: 0.9 mg/dL (ref 0.44–1.00)
GFR, Estimated: >60 mL/min (ref >60)
Glucose, Bld: 124 mg/dL — ABNORMAL HIGH (ref 70–99)
Potassium: 4.9 mmol/L (ref 3.5–5.1)
Sodium: 138 mmol/L (ref 135–145)
Total Bilirubin: 0.8 mg/dL (ref 0.3–1.2)
Total Protein: 6.4 g/dL — ABNORMAL LOW (ref 6.5–8.1)

## 2020-03-30 LAB — GLUCOSE, CAPILLARY
Glucose-Capillary: 110 mg/dL — ABNORMAL HIGH (ref 70–99)
Glucose-Capillary: 125 mg/dL — ABNORMAL HIGH (ref 70–99)
Glucose-Capillary: 138 mg/dL — ABNORMAL HIGH (ref 70–99)
Glucose-Capillary: 144 mg/dL — ABNORMAL HIGH (ref 70–99)
Glucose-Capillary: 144 mg/dL — ABNORMAL HIGH (ref 70–99)
Glucose-Capillary: 232 mg/dL — ABNORMAL HIGH (ref 70–99)
Glucose-Capillary: 71 mg/dL (ref 70–99)

## 2020-03-30 LAB — PHOSPHORUS: Phosphorus: 4.1 mg/dL (ref 2.5–4.6)

## 2020-03-30 LAB — MAGNESIUM: Magnesium: 2.7 mg/dL — ABNORMAL HIGH (ref 1.7–2.4)

## 2020-03-30 MED ORDER — GABAPENTIN 300 MG PO CAPS
600.0000 mg | ORAL_CAPSULE | Freq: Every day | ORAL | Status: DC
  Administered 2020-03-30 – 2020-04-15 (×16): 600 mg via ORAL
  Filled 2020-03-30 (×17): qty 2

## 2020-03-30 MED ORDER — GABAPENTIN 300 MG PO CAPS: 300.0000 mg | ORAL_CAPSULE | Freq: Every day | ORAL | Status: DC

## 2020-03-30 MED ORDER — KETOTIFEN FUMARATE 0.025 % OP SOLN
1.0000 [drp] | Freq: Two times a day (BID) | OPHTHALMIC | Status: DC
  Administered 2020-03-30 – 2020-04-16 (×34): 1 [drp] via OPHTHALMIC
  Filled 2020-03-30 (×2): qty 5

## 2020-03-30 MED ORDER — MELATONIN 5 MG PO TABS
5.0000 mg | ORAL_TABLET | Freq: Every day | ORAL | Status: DC
  Administered 2020-03-30 – 2020-04-15 (×17): 5 mg via ORAL
  Filled 2020-03-30 (×17): qty 1

## 2020-03-30 MED ORDER — FUROSEMIDE 10 MG/ML IJ SOLN
20.0000 mg | Freq: Once | INTRAMUSCULAR | Status: AC
  Administered 2020-03-30: 20 mg via INTRAVENOUS
  Filled 2020-03-30: qty 2

## 2020-03-30 NOTE — Progress Notes (Signed)
Meds scanned by student RN witnessed by this RN.

## 2020-03-30 NOTE — Progress Notes (Signed)
PROGRESS NOTE    Veronica Horton  MGQ:[id number] DOB: 07/19/1960 DOA: 03/10/2020 PCP: [facility]   Chief Complaint  Patient presents with  . Shortness of Breath   Brief Narrative: Patient is Veronica Horton 58 year old female with history of morbid obesity, hiatal hernia, hypertension, COPD, chronic respiratory failure on home oxygen at 5 L/min, smoker who presents to the emergency room  On 03/10/20 with complaint of shortness of breath, altered mental status.  On presentation ABG showed PCO2 of 92 and she had to be kept on BiPAP.  She was found to have laryngeal mass concerning for malignancy and she underwent biopsy which showed invasive squamous  cell carcinoma and she required tracheostomy.  She had to be transferred to ICU post biopsy and had to be kept on ventilator support.  She was transferred to [facility] on 11/17 for radiation therapy.  ransfer to [facility] service from [facility] service on 03/20/2020. Underwent  PEG placement  by IR on 03/22/2020. She started concurrent chemo radiation on 11/29.  Currently working on discharge planning.  Therapy recommending home health with 24 hr supervision/assistance.  CM currently working on [facility], some difficulty with finding accepting agency.  Currently discharge planning pending.  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  Found to have airway obstruction/compromise due to laryngeal mass.  CT soft tissue neck on 11/11 showed bulky bilateral supraglottic tumor with  possible airway compromise, involvement of right laryngeal cartilages.  ENT was consulted.  She underwent laryngeal biopsy which showed squamous cell carcinoma.   Status post tracheostomy Underwent PEG placement by IR on 11/22. CT chest/abdomen/pelvis did not show signs of metastatic disease. She was recommended total laryngectomy with bilateral neck dissection but she declined surgery.  Plan is to start concurrent chemoradiation during this hospitalization.  Oncology/radiation oncology following.  Per oncology, started chemoradiation 11/29. S/p MBS today, per speech dysphagia 1 thin liquids, medication with puree -> per RD, transitioning to bolus feed program with tube feeds - per RD, if able to tolerate PO, goal for bolus feeds is 237 ml osmolite 1.5 QID (if unable to tolerated PO intake 100% of nutritional needs will be met with 237 ml osmolite 1.5 6 times daily)  Acute on chronic hypoxic/hypercarbic respiratory failure: History of COPD, on oxygen at home at 5 L/min.  Chronic respiratory failure exacerbated by laryngeal squamous cell carcinoma, COPD exacerbation, pneumonia.   Hospital course was remarkable for Serratia pneumonia.  Finished the course of antibiotics with levofloxacin.  Currently on trach collar, currently requiring 10 L by trach collar on 40% FiO2, wean as tolerated Repeat CXR 11/25 with persistent subsegmental atelectasis at R base with improving atelectasis vs consolidation and tiny effusion at L base Repeat CXR 11/29 with interval small bilateral effusions -> received lasix 11/29 and 11/30, follow   COPD exacerbation: Being treated with steroids, bronchodilators.  Continue pulmonary hygiene, ventilator support at night, trach collar during the day.  Follow intermittent chest x-ray.  Aspiration precautions.  Steroids off.  No wheezing appreciated.  Hypoglycemia: resolved with tube feeds, continue to monitor  GERD: Continue PPI  Hypertension: Monitor blood pressure.  Off  antihypertensives which she was at home.  Blood pressure okay.  AKI: Resolved  Thrombocytopenia: resolved.  Disposition: therapy now recommending HH and 24 hr supervision/assistance, CM working on home health, but apparently no accepting agency.  Will need to ensure she's able to manage trach and o2 needs at home as well.   Nutrition Problem: Increased nutrient needs Etiology: cancer and cancer related treatments, chronic illness  DVT prophylaxis:lovenox Code Status:full  Family Communication: [name] 11/30 Disposition:   Status is: Inpatient  Remains inpatient appropriate because:Inpatient level of care appropriate due to severity of illness   Dispo: The patient is from: Home              Anticipated d/c is to: CIR              Anticipated d/c date is: > 3 days              Patient currently is not medically stable to d/c.       Consultants:   PCCM  Rad onc  Oncology  ENT  Procedures:  tracheostomy  Echo IMPRESSIONS    1. Left ventricular ejection fraction, by estimation, is 70 to 75%. The  left ventricle has hyperdynamic function. The left ventricle has no  regional wall motion abnormalities. There is mild left ventricular  hypertrophy. Left ventricular diastolic  parameters were normal.  2. Right ventricular systolic function is normal. The right ventricular  size is normal. Tricuspid regurgitation signal is inadequate for assessing  PA pressure.  3. The mitral valve is grossly normal. Trivial mitral valve  regurgitation.  4. The aortic valve is tricuspid. Aortic valve regurgitation is not  visualized.  5. The inferior vena cava is normal in size with greater than 50%  respiratory variability, suggesting right atrial pressure of 3 mmHg.  Antimicrobials:  Anti-infectives (From admission, onward)   Start     Dose/Rate Route Frequency Ordered Stop   03/22/20 1700  vancomycin (VANCOCIN) IVPB 1000 mg/200 mL premix  Status:  Discontinued         1,000 mg 200 mL/hr over 60 Minutes Intravenous  Once 03/22/20 1605 03/23/20 0236   03/22/20 1604  vancomycin (VANCOCIN) 1-5 GM/200ML-% IVPB       Note to Pharmacy: [name]   : cabinet override      03/22/20 1604 03/22/20 1615   03/14/20 1100  levofloxacin (LEVAQUIN) IVPB 750 mg        750 mg 100 mL/hr over 90 Minutes Intravenous Daily 03/14/20 0955 03/19/20 1146   03/10/20 1730  doxycycline (VIBRAMYCIN) 100 mg in sodium chloride 0.9 % 250 mL IVPB  Status:  Discontinued        100 mg 125 mL/hr over 120 Minutes Intravenous Every 12 hours 03/10/20 1634 03/14/20 0955   03/10/20 1600  azithromycin (ZITHROMAX) 500 mg in sodium chloride 0.9 % 250 mL IVPB  Status:  Discontinued        500 mg 250 mL/hr over 60 Minutes Intravenous Every 24 hours 03/10/20 1529 03/10/20 1538   03/10/20 1500  clindamycin (CLEOCIN) IVPB 600 mg  Status:  Discontinued        600 mg 100 mL/hr over 30 Minutes Intravenous  Once 03/10/20 1458 03/10/20 1521       Subjective: No new complaints  Objective: Vitals:   03/30/20 0421 03/30/20 0500 03/30/20 0826 03/30/20 1225  BP: 107/60 **Note De-identified vi Obfusction** Pulse: (!) 47   70  Resp: 14   17  Temp: 97.9 F (36.6 C)     TempSrc: Orl     SpO2: 92%  92% 90%  Weight:  94.6 kg    Height:        Intke/Output Summry (Lst 24 hours) t 03/30/2020 1715 Lst dt filed t 03/30/2020 1647 Gross per 24 hour  Intke 1248 ml  Output 950 ml  Net 298 ml   Filed Weights   03/28/20 0625 03/29/20 0500 03/30/20 0500  Weight: 113.4 kg 113.8 kg 94.6 kg    Exmintion:  Generl: No cute distress. Crdiovsculr: Hert sounds show  regulr rte, nd rhythm Lungs: Cler to usculttion bilterlly. Trch in plce. Abdomen: Soft, nontender, nondistended.  PEG. Neurologicl: Alert nd oriented 3. Moves ll extremities 4. Crnil nerves II through XII grossly intct. Skin: Wrm nd dry. No rshes or lesions. Extremities: No clubbing or cynosis. No edem  Dt Reviewed: I hve  personlly reviewed following lbs nd imging studies  CBC: Recent Lbs  Lb 03/26/20 0535 03/27/20 0616 03/28/20 0646 03/29/20 0515 03/30/20 0819  WBC 7.6 8.5 7.9 7.0 9.8  NEUTROABS 5.2 6.4 5.7 4.8 9.1*  HGB 12.9 13.0 12.3 12.1 13.3  HCT 40.8 40.0 38.0 37.7 41.1  MCV 99.3 98.3 98.7 99.0 97.9  PLT 167 158 152 149* 169    Bsic Metbolic Pnel: Recent Lbs  Lb 03/26/20 0535 03/27/20 0616 03/28/20 0646 03/29/20 0515 03/30/20 0819  NA 137 140 139 138 138  K 4.0 4.4 4.1 4.4 4.9  CL 104 106 105 103 103  CO2 25 26 27 27 27  GLUCOSE 141* 137* 134* 130* 124*  BUN 22* 17 17 19 26*  CREATININE 0.90 0.88 0.95 0.97 0.90  CALCIUM 8.4* 8.5* 8.4* 8.4* 8.9  MG 2.5* 2.4 2.3 2.3 2.7*  PHOS 4.5 3.5 4.4 4.4 4.1    GFR: Estimted Cretinine Clernce: 75.6 mL/min (by [employer] formul bsed on SCr of 0.9 mg/dL).  Liver Function Tests: Recent Lbs  Lb 03/26/20 0535 03/27/20 0616 03/28/20 0646 03/29/20 0515 03/30/20 0819  AST 15 14* 13* 14* 17  ALT 17 15 16 15 16  ALKPHOS 52 55 52 53 56  BILITOT 0.7 0.5 0.6 0.7 0.8  PROT 5.3* 5.5* 5.4* 5.6* 6.4*  ALBUMIN 2.7* 2.7* 2.6* 2.7* 2.9*    CBG: Recent Lbs  Lb 03/30/20 0027 03/30/20 0417 03/30/20 0752 03/30/20 1104 03/30/20 1606  GLUCAP 125* 138* 110* 232* 71     Recent Results (from the pst 240 hour(s))  Culture, respirtory (non-expectorted)     Sttus: None   Collection Time: 03/21/20  4:25 PM   Specimen: Trchel Aspirte; Respirtory  Result Vlue Ref Rnge Sttus   Specimen Description   Finl    TRACHEAL ASPIRATE Performed t [facility], [address] [place], [place] [address]    Specil Requests   Finl    NONE Performed t [facility], [address] [place], [place] [address]    Grm Stin   Finl    FEW WBC PRESENT, PREDOMINANTLY PMN FEW GRAM POSITIVE COCCI IN PAIRS IN CLUSTERS RARE GRAM NEGATIVE RODS    Culture   Finl    Norml respirtory flor-no  Stph ureus or Pseudomons seen Performed t [facility], [address] [place], [place] [address]    Report Sttus 03/23/2020 FINAL  Finl         Rdiology Studies: DG CHEST PORT 1   VIEW  Result Date: 03/29/2020 CLINICAL DATA:  Dyspnea EXAM: PORTABLE CHEST 1 VIEW COMPARISON:  03/25/2020 FINDINGS: Tracheostomy unchanged. Pulmonary insufflation is symmetric though has decreased slightly since prior examination. Small bilateral pleural effusions have developed with associated bibasilar atelectasis. No pneumothorax. Cardiac size within normal limits. Pulmonary vascularity is normal. No acute bone abnormality. IMPRESSION: Interval development of small bilateral pleural effusions with bibasilar compressive atelectasis. Electronically Signed   By: Ashesh  Parikh MD   On: 03/29/2020 06:00        Scheduled Meds: . arformoterol  15 mcg Nebulization BID  . budesonide (PULMICORT) nebulizer solution  0.5 mg Nebulization BID  . chlorhexidine  15 mL Mouth Rinse BID  . Chlorhexidine Gluconate Cloth  6 each Topical Daily  . docusate  100 mg Per Tube BID  . enoxaparin (LOVENOX) injection  55 mg Subcutaneous Q24H  . feeding supplement  237 mL Oral BID BM  . feeding supplement (OSMOLITE 1.5 CAL)  237 mL Per Tube TID BM  . gabapentin  300 mg Oral QHS  . insulin aspart  0-9 Units Subcutaneous Q4H  . ipratropium-albuterol  3 mL Nebulization TID  . lidocaine  1 patch Transdermal Q24H  . mouth rinse  15 mL Mouth Rinse q12n4p  . melatonin  5 mg Oral QHS  . multivitamin with minerals  1 tablet Per Tube Daily  . nicotine  21 mg Transdermal Daily  . pantoprazole sodium  40 mg Per Tube Daily  . scopolamine  1 patch Transdermal Q72H   Continuous Infusions: . sodium chloride       LOS: 20 days    Time spent: over 30 min    Caldwell Powell, MD [facility]   To contact the attending provider between 7A-7P or the covering provider during after hours 7P-7A, please log into the  web site [email or web address] and access using universal  password for that web site. If you do not have the password, please call the hospital operator.  03/30/2020, 5:15 PM  

## 2020-03-30 NOTE — Progress Notes (Signed)
Physical Therapy Treatment Patient Details Name: Veronica Horton MRN: [id number] DOB: 1960/10/02 Today's Date: 03/30/2020    History of Present Illness Ms. Turck is a 59 year old female with medical history significant for COPD with chronic hypoxic respiratory failure on 5 L, HTN, tobacco use, obesity class 3 who presented on 11/10 with worsening shortness of breath over the past 4 days and increased confusion. Pt found to be in COPD exacerbation, hospital course at [facility] complicated by dysphasia, pt found to have a laryngeal mass. Pt transferred to [facility]. Pt underwent trach placement on 11/13 s/p layrngeal mass biopsy, went on vent 11/14 in AM due to desaturation..11/22 G-tube    PT Comments    Patient motivated to ambulate, tolerated 240' using RW and 8 L/40% trach collar. SPO2 after ambulation  +91%,   Follow Up Recommendations  Home health PT;Supervision/Assistance - 24 hour     Equipment Recommendations  None recommended by PT    Recommendations for Other Services       Precautions / Restrictions Precautions Precautions: Fall Precaution Comments: trach collar, G-tube    Mobility  Bed Mobility         Supine to sit: Modified independent (Device/Increase time)        Transfers   Equipment used: Rolling walker (2 wheeled) Transfers: Sit to/from Stand Sit to Stand: Min guard         General transfer comment: steady asssit to rise from bed  Ambulation/Gait Ambulation/Gait assistance: Min guard Gait Distance (Feet): 240 Feet Assistive device: Rolling walker (2 wheeled) Gait Pattern/deviations: Step-through pattern Gait velocity: decreased   General Gait Details: tolerated distance well, min/guard for O2   Stairs             Wheelchair Mobility    Modified Rankin (Stroke Patients Only)       Balance Overall balance assessment: Mild deficits observed, not formally tested Sitting-balance support: No upper extremity supported;Feet  supported Sitting balance-Leahy Scale: Good       Standing balance-Leahy Scale: Fair Standing balance comment: Dependent on one hand for steadying herself                            Cognition Arousal/Alertness: Awake/alert Behavior During Therapy: WFL for tasks assessed/performed                                          Exercises      General Comments        Pertinent Vitals/Pain Pain Assessment: No/denies pain    Home Living                      Prior Function            PT Goals (current goals can now be found in the care plan section) Progress towards PT goals: Progressing toward goals    Frequency    Min 3X/week      PT Plan Current plan remains appropriate    Co-evaluation              AM-PAC PT "6 Clicks" Mobility   Outcome Measure  Help needed turning from your back to your side while in a flat bed without using bedrails?: None Help needed moving from lying on your back to sitting on the side of a flat bed without using bedrails?:  None Help needed moving to and from a bed to a chair (including a wheelchair)?: A Little Help needed standing up from a chair using your arms (e.g., wheelchair or bedside chair)?: A Little Help needed to walk in hospital room?: A Little Help needed climbing 3-5 steps with a railing? : A Little 6 Click Score: 20    End of Session Equipment Utilized During Treatment: Oxygen Activity Tolerance: Patient tolerated treatment well Patient left: in bed;with call bell/phone within reach Nurse Communication: Mobility status PT Visit Diagnosis: Muscle weakness (generalized) (M62.81);Difficulty in walking, not elsewhere classified (R26.2)     Time: 1135-1200 PT Time Calculation (min) (ACUTE ONLY): 25 min  Charges:  $Gait Training: 23-37 mins                     [place] Pager [phone] Office [phone]    Claretha Cooper 03/30/2020, 1:47 PM

## 2020-03-30 NOTE — TOC Progression Note (Signed)
Transition of Care [facility]) - Progression Note    Patient Details  Name: Veronica Horton MRN: [id number] Date of Birth: 02/03/1961  [facility] CM/SW Contact  Paizlie Klaus, [name], RN Phone Number: 03/30/2020, 1:29 PM  Clinical Narrative:  Patient/sister [name] state patient will stay with an [name] in [place] [address]-[name] who lives down the rd will asst with care-family willing to learn. Unable to find [facility] agency-will accept a trach.no insurance-medicaid pending.Patient states she does not want to go to a nursing home. Getting chemo, then xrt after. Discussed case with supv.     Expected Discharge Plan: Home/Self Care Barriers to Discharge: Continued Medical Work up  Expected Discharge Plan and Services Expected Discharge Plan: Home/Self Care In-house Referral: Financial Counselor Discharge Planning Services: CM Consult Post Acute Care Choice: Durable Medical Equipment                   DME Arranged: Trach supplies DME Agency: [employer] Date DME Agency Contacted: 03/22/20 Time DME Agency Contacted: 1229 Representative spoke with at DME Agency: [email or web address]             Social Determinants of Health ([place]) Interventions    Readmission Risk Interventions Readmission Risk Prevention Plan 01/20/2019  Medication Screening Complete  Transportation Screening Complete  Some recent data might be hidden

## 2020-03-31 ENCOUNTER — Inpatient Hospital Stay (HOSPITAL_COMMUNITY): Payer: Medicaid Other

## 2020-03-31 ENCOUNTER — Ambulatory Visit
Admission: RE | Admit: 2020-03-31 | Discharge: 2020-03-31 | Disposition: A | Discharge status: Home / Self Care | Payer: Medicaid Other | Source: Ambulatory Visit | Attending: Radiation Oncology | Admitting: Radiation Oncology

## 2020-03-31 DIAGNOSIS — C328 Malignant neoplasm of overlapping sites of larynx: Secondary | ICD-10-CM | POA: Insufficient documentation

## 2020-03-31 DIAGNOSIS — J410 Simple chronic bronchitis: Secondary | ICD-10-CM

## 2020-03-31 DIAGNOSIS — Z923 Personal history of irradiation: Secondary | ICD-10-CM | POA: Insufficient documentation

## 2020-03-31 DIAGNOSIS — Z931 Gastrostomy status: Secondary | ICD-10-CM

## 2020-03-31 DIAGNOSIS — Z510 Encounter for antineoplastic radiation therapy: Secondary | ICD-10-CM | POA: Insufficient documentation

## 2020-03-31 LAB — COMPREHENSIVE METABOLIC PANEL
ALT: 22 U/L (ref 0–44)
AST: 19 U/L (ref 15–41)
Albumin: 2.9 g/dL — ABNORMAL LOW (ref 3.5–5.0)
Alkaline Phosphatase: 52 U/L (ref 38–126)
Anion gap: 7 (ref 5–15)
BUN: 34 mg/dL — ABNORMAL HIGH (ref 6–20)
CO2: 29 mmol/L (ref 22–32)
Calcium: 8.6 mg/dL — ABNORMAL LOW (ref 8.9–10.3)
Chloride: 99 mmol/L (ref 98–111)
Creatinine, Ser: 1.19 mg/dL — ABNORMAL HIGH (ref 0.44–1.00)
GFR, Estimated: 52 mL/min — ABNORMAL LOW (ref >60)
Glucose, Bld: 94 mg/dL (ref 70–99)
Potassium: 4.8 mmol/L (ref 3.5–5.1)
Sodium: 135 mmol/L (ref 135–145)
Total Bilirubin: 0.5 mg/dL (ref 0.3–1.2)
Total Protein: 5.8 g/dL — ABNORMAL LOW (ref 6.5–8.1)

## 2020-03-31 LAB — CBC WITH DIFFERENTIAL/PLATELET
Abs Immature Granulocytes: 0.03 10*3/uL (ref 0.00–0.07)
Basophils Absolute: 0 10*3/uL (ref 0.0–0.1)
Basophils Relative: 0 %
Eosinophils Absolute: 0 10*3/uL (ref 0.0–0.5)
Eosinophils Relative: 0 %
HCT: 39.2 % (ref 36.0–46.0)
Hemoglobin: 12.5 g/dL (ref 12.0–15.0)
Immature Granulocytes: 0 %
Lymphocytes Relative: 11 %
Lymphs Abs: 0.9 10*3/uL (ref 0.7–4.0)
MCH: 31.6 pg (ref 26.0–34.0)
MCHC: 31.9 g/dL (ref 30.0–36.0)
MCV: 99 fL (ref 80.0–100.0)
Monocytes Absolute: 0.8 10*3/uL (ref 0.1–1.0)
Monocytes Relative: 9 %
Neutro Abs: 6.8 10*3/uL (ref 1.7–7.7)
Neutrophils Relative %: 80 %
Platelets: 170 10*3/uL (ref 150–400)
RBC: 3.96 MIL/uL (ref 3.87–5.11)
RDW: 14.6 % (ref 11.5–15.5)
WBC: 8.6 10*3/uL (ref 4.0–10.5)
nRBC: 0 % (ref 0.0–0.2)

## 2020-03-31 LAB — GLUCOSE, CAPILLARY
Glucose-Capillary: 100 mg/dL — ABNORMAL HIGH (ref 70–99)
Glucose-Capillary: 104 mg/dL — ABNORMAL HIGH (ref 70–99)
Glucose-Capillary: 106 mg/dL — ABNORMAL HIGH (ref 70–99)
Glucose-Capillary: 111 mg/dL — ABNORMAL HIGH (ref 70–99)
Glucose-Capillary: 69 mg/dL — ABNORMAL LOW (ref 70–99)
Glucose-Capillary: 74 mg/dL (ref 70–99)
Glucose-Capillary: 86 mg/dL (ref 70–99)

## 2020-03-31 LAB — MAGNESIUM: Magnesium: 2.4 mg/dL (ref 1.7–2.4)

## 2020-03-31 LAB — PHOSPHORUS: Phosphorus: 5.4 mg/dL — ABNORMAL HIGH (ref 2.5–4.6)

## 2020-03-31 MED ORDER — ENSURE ENLIVE PO LIQD
237.0000 mL | Freq: Three times a day (TID) | ORAL | Status: DC
  Administered 2020-03-31 – 2020-04-12 (×18): 237 mL via ORAL

## 2020-03-31 MED FILL — Insulin Aspart Inj 100 Unit/ML: SUBCUTANEOUS | Qty: 0.01 | Status: AC

## 2020-03-31 NOTE — Progress Notes (Signed)
Manual CPT done at this time. Pt coughing up secretions with strong cough. No suction needed at this time.

## 2020-03-31 NOTE — Progress Notes (Signed)
NAME:  Veronica Horton, MRN:  [id number], DOB:  [birth year], LOS: 21 ADMISSION DATE:  03/10/2020, CONSULTATION DATE:  03/31/20 REFERRING MD:  Hospitalist, CHIEF COMPLAINT:  Respiratory failure   Brief History   59 y.o. F with PMH of COPD 11/10 with shortness of breath found to have laryngeal mass concerning for malignancy s/p biopsy and tracheostomy.  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  She was transferred to the intensive care unit post biopsy as she required ventilator support.  Tx to [facility] on 11/17 for ONC therapy.   Past Medical History  COPD, Bronchitis,Class III Obesity ,DJD,[facility]atal [place] [facility] Events   11/10 admit to hospitalists with SOB 11/13 laryngoscopy and biopsy with tracheostomy 11/15 transferred to intensive care unit 11/15 transition to trach collar 11/17 Tx to [facility] for ONC therapy. 24 hours on ATC.  11/22 G Tube with IR  11/23 starting EN   Consults:  ENT Oncology PCCM  Procedures:  ENT Trach 11/13 > IR GTube 11/22>   Significant Diagnostic Tests:  11/11 CT soft tissue neck with contrast >> Bulky bilateral supraglottic tumor with possible airway Compromise, Involvement of right laryngeal cartilages, with extension in the midline anterior to the strap muscles, and also extension through the right paraglottic space and/or inseparable malignant right level IIIa lymph node. 11/15 CT chest/abdomen/pelvis >> no signs of metastatic disease, rounded partially necrotic lymph node to the left neck, basilar consolidative changes similar to prior, small pneumomediastinum  Micro Data:  COVID 11/10 >> negative Influenza 11/10 >> negative  MRSA PCR 11/10 >>  Sputum 11/14 >> serratia >> R-cefazolin, S-ceftriaxone Trach aspirate 11/21> normal flora   Antimicrobials:  Azithro 11/10 x1  Clinda 11/10 x1  Doxycycline 11/10 >> 11/14  Levofloxacin 11/14 >> 11/19  Interim history/subjective:  Resting in bed.  No complaints . Now down to 40% ATC  Objective   Blood pressure 101/63, pulse (Abnormal) 54, temperature 98.5 F (36.9 C), temperature source Oral, resp. rate 16, height 5\' 5"  (1.651 m), weight 94.6 kg, SpO2 94 %.    FiO2 (%):  [40 %] 40 %   Intake/Output Summary (Last 24 hours) at 03/31/2020 0934 Last data filed at 03/31/2020 0640 Gross per 24 hour  Intake 711 ml  Output 375 ml  Net 336 ml   Filed Weights   03/28/20 0625 03/29/20 0500 03/30/20 0500  Weight: 113.4 kg 113.8 kg 94.6 kg    Exam: General this is a delightful 59 year old female sitting up in bed. She is in no acute distress this am  HENT # 6 cuffed trach in place. Secretions thin. She is not able to phonate d/t laryngeal mass and airway occlusion Pulm faint exp wheeze no accessory use 40% ATC  Card RRR abd soft not tender tol diet Neuro intact gu voids Ext chronic venous congestion changes over LEs. 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 No intubation from above due to airway occlusion. pcxr improved aeration bibasilar atx low volume  Plan Cont routine trach care Brovana and Yupelri at [employer] (won't be able to use MDIs d/t upper airway  occlusion) Can't use PMB due to obstruction Will change to cuffless trach Initiate trach care teaching Mobilize Daily assessment for diuretics   Supraglottic squamous cell laryngeal cancer ([id number]) with airway compromise, s/p laryngoscopy with biopsy & tracheostomy  -appreciate ENT, ONC input  Plan Will eventually have concurrent chemo and xrt F/u w/ ONC and ENT    Best practice:   Diet:EN via G tube  Pain/Anxiety/Delirium protocol (if indicated): Fentanyl prn VAP protocol (if indicated): HOB elevated   DVT prophylaxis: Lovenox GI prophylaxis: PPI Glucose control: SSI Mobility: PT/OT, continue efforts at advancement  Code Status: Full code Family Communication: pt updated 11/29 Disposition: per primary -- PCCM following for trach   Erick Colace ACNP-BC [place] Pager # [phone] OR # [phone] if no answer

## 2020-03-31 NOTE — Progress Notes (Signed)
Occupational Therapy Treatment Patient Details Name: Veronica Horton MRN: [id number] DOB: 12/16/1960 Today's Date: 03/31/2020    History of present illness Veronica Horton is a 59 year old female with medical history significant for COPD with chronic hypoxic respiratory failure on 5 L, HTN, tobacco use, obesity class 3 who presented on 11/10 with worsening shortness of breath over the past 4 days and increased confusion. 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 Pt transferred to [facility]. Pt underwent trach placement on 11/13 s/p layrngeal mass biopsy, went on vent 11/14 in AM due to desaturation..11/22 G-tube   Horton comments  Treatment focused on improving activity tolerance, ADLs and preparation for return home. Patient able to don socks sitting in bed, ambulate in room and hall with RW. Patient demonstrated ability to straighten up bed linens with BUEs without loss of balance. Patient ambulated approx 300 feet in hall with RW on 8L trach collar - o2 sat dropped to 85% but recovered quickly. Patient and therapist discussed home environment and therapist recommends 3in1 BSC. Patient fearful of slipping in walk in shower. Discussed need for Veronica Horton at home - to improve patient's independence and make home environment safe.    Follow Up Recommendations  Home health Horton;Supervision - Intermittent    Equipment Recommendations  3 in 1 bedside commode    Recommendations for Other Services      Precautions / Restrictions Precautions Precautions: Fall Precaution Comments: trach collar, G-tube Restrictions Weight Bearing Restrictions: No       Mobility Bed Mobility Overal bed mobility: Needs Assistance Bed Mobility: Sit to Supine;Supine to Sit     Supine to sit: Modified independent (Device/Increase time);HOB elevated Sit to supine: Supervision   General bed mobility comments: Mod I - increased time and use of bed  rail.  Transfers Overall transfer level: Needs assistance Equipment used: Rolling walker (2 wheeled) Transfers: Sit to/from Stand Sit to Stand: Min guard         General transfer comment: Min guard to ambulate in hall (at patient's request) with RW on 8L trach collar    Balance Overall balance assessment: Mild deficits observed, not formally tested         Standing balance support: During functional activity Standing balance-Leahy Scale: Fair Standing balance comment: use of RW with ambulation but demonstrated ability to use UEs to manage bed lines                           ADL either performed or assessed with clinical judgement   ADL               Lower Body Bathing: Set up;Bed level Lower Body Bathing Details (indicate cue type and reason): able to don socks sitting in bed                       General ADL Comments: POC is for patient to go home at this time. During ambulation and mobility patient and therapist discussed home environement and DME needs.     Vision Baseline Vision/History: Wears glasses Wears Glasses: Reading only Patient Visual Report: No change from baseline     Perception     Praxis      Cognition Arousal/Alertness: Awake/alert Behavior During Therapy: WFL for tasks assessed/performed Overall Cognitive Status: Within Functional Limits for tasks assessed  Exercises     Shoulder Instructions       General Comments      Pertinent Vitals/ Pain       Pain Assessment: No/denies pain  Home Living                                          Prior Functioning/Environment              Frequency  Min 2X/week        Progress Toward Goals  Horton Goals(current goals can now be found in the care plan section)  Progress towards Horton goals: Progressing toward goals  Acute Rehab Horton Goals Patient Stated Goal: to go home Horton Goal  Formulation: With patient Time For Goal Achievement: 04/11/20 Potential to Achieve Goals: Good  Plan Discharge plan needs to be updated    Co-evaluation          Horton goals addressed during session: ADL's and self-care (mobility, DME equipment for home)      AM-PAC Horton "6 Clicks" Daily Activity     Outcome Measure   Help from another person eating meals?: None Help from another person taking care of personal grooming?: A Little Help from another person toileting, which includes using toliet, bedpan, or urinal?: A Little Help from another person bathing (including washing, rinsing, drying)?: A Little Help from another person to put on and taking off regular upper body clothing?: A Little Help from another person to put on and taking off regular lower body clothing?: A Little 6 Click Score: 19    End of Session Equipment Utilized During Treatment: Oxygen;Rolling walker  Horton Visit Diagnosis: Unsteadiness on feet (R26.81);Muscle weakness (generalized) (M62.81);Pain   Activity Tolerance Patient tolerated treatment well   Patient Left in chair;with call bell/phone within reach   Nurse Communication Mobility status        Time: 1308-6578 Horton Time Calculation (min): 28 min  Charges: Horton General Charges $Horton Visit: 1 Visit Horton Treatments $Self Care/Home Management : 8-22 mins $Therapeutic Activity: 8-22 mins  Veronica Horton, OTR/L [place]  Office [phone] Pager: [place] 03/31/2020, 4:39 PM

## 2020-03-31 NOTE — Progress Notes (Signed)
Speech Language Pathology Treatment: Dysphagia  Patient Details Name: Veronica Horton MRN: [id number] DOB: July 09, 1960 Today's Date: 03/31/2020 Time: 2841-3244 SLP Time Calculation (min) (ACUTE ONLY): 40 min  Assessment / Plan / Recommendation Clinical Impression  Session focused on assessing po tolerance, providing pt with information re: xerostomia compensations, potential side effects of chemoradiation on swallowing and compensation/mitigation strategies.  Pt communicated that she already has Biotene rinse and antibacterial rinse that is nonalcoholic.  Pt communicates via writing effectively and her articulation abilities are impeccable.    She wants her diet advanced to allow her more freedom of food choices and assures this SLP that she will only order soft foods.  Given her cognition is intact and she desires to focus on oral po vs tube feeding, providing an opportunity is reasonable.    Pt advised that she MUST ORDER  HER MEALS - providing NURSE or NURSE TECH with her food order in writing *one list for entire day* so that she does not receive foods she can't manage.  She reports she will not eat foods that are hard to masticate and she was eating softer foods at home.  She does however want to try pizza, cake, etc.  Messaged MD and received approval for plan.    Meal arrived during session and SLP observed pt consuming meal to assess tolerance.  She consumed brocolli that she cut well, beans, rice and a sweet potato.  Occasional cough during intake noted with expectoration of secretions via trach x3- tracheal secretions were initially viscocus and blood tinged with last being very thin and on her chest.  Nothing expectorated was the color of what she was consuming.   RT reports her secretions have decreased in amount since last week.    Recommend advance to regular/thin with strict precautions and SLP will follow up .  Using "teach back" and written cues/sign, pt able to state strategies to  combat potential chemo-rad side effects and xerostomia as well as need to drink liquids during meal.    SLP will follow up for tolerance and to provide pt with pharyngeal exercises to start when she begins experiencing dysphagia. RN and NT informed of recommendations.  Thanks!      HPI HPI: Veronica Horton is a 59 y/o F with history of COPD,chronic respiratory failure on home oxygen at 5 L/min, with large laryngeal mass esion, now s/p tracheostomy with Shiley 6-0 and direct laryngoscopy and biopsy. 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  Pt is also s/p PEG for nutrition.  Plans are for chemo and radiation concurrently starting while in hospital.  Prior medical history includes smoking and some dyspnea if walking long distanced.  Pt underwent MBS today and follow up completed to educate her to recommendations.  She has been on a dys3/thin diet and follow up indicated to provide information and compensations for potential side effects of radiation and compensation strategies.  In addition assuring pt tolerating diet indicated.  She has started XRT and reports she is on her 3rd treatment.      SLP Plan  Continue with current plan of care       Recommendations  Diet recommendations: Regular;Thin liquid (with pt ordering only soft foods) Liquids provided via: Straw;Cup Medication Administration: Via alternative means (or with puree) Supervision: Patient able to self feed Compensations: Slow rate;Small sips/bites;Follow solids with liquid Postural Changes and/or Swallow Maneuvers: Seated upright 90 degrees;Upright 30-60 min after meal  Oral Care Recommendations: Oral care BID Follow up Recommendations: Home health SLP SLP Visit Diagnosis: Dysphagia, pharyngoesophageal phase (R13.14) Plan: Continue with current plan of care       GO              [name], MS [place] Office [phone] Pager [phone]    [name] 03/31/2020, 6:39 PM

## 2020-03-31 NOTE — Progress Notes (Signed)
HEMATOLOGY-ONCOLOGY PROGRESS NOTE  SUBJECTIVE:   She looks well, feels well. No pain Breathing stable.  She has been eating ok. No nausea or vomiting Bowels moving well. Urinating well.  Rest of the pertinent 10 point ROS reviewed and negative  I have reviewed the past medical history, past surgical history, social history and family history with the patient and they are unchanged from previous note.   PHYSICAL EXAMINATION: ECOG PERFORMANCE STATUS: 2 - Symptomatic, <50% confined to bed  Vitals:   03/31/20 1219 03/31/20 1324  BP: (!) 82/52   Pulse: 72   Resp: 19   Temp: 98.4 F (36.9 C)   SpO2: 93% 92%   Filed Weights   03/28/20 0625 03/29/20 0500 03/30/20 0500  Weight: 250 lb (113.4 kg) 250 lb 14.1 oz (113.8 kg) 208 lb 8.9 oz (94.6 kg)    Intake/Output from previous day: 11/30 0701 - 12/01 0700 In: 1008 [P.O.:237; NG/GT:711] Out: 375 [Urine:375]  GENERAL:alert, no distress and comfortable, on trach collar Ant lung fields clear. Abdomen. Non tender non distended BLE venous stasis , no change.   LABORATORY DATA:  I have reviewed the data as listed CMP Latest Ref Rng & Units 03/31/2020 03/30/2020 03/29/2020  Glucose 70 - 99 mg/dL 94 124(H) 130(H)  BUN 6 - 20 mg/dL 34(H) 26(H) 19  Creatinine 0.44 - 1.00 mg/dL 1.19(H) 0.90 0.97  Sodium 135 - 145 mmol/L 135 138 138  Potassium 3.5 - 5.1 mmol/L 4.8 4.9 4.4  Chloride 98 - 111 mmol/L 99 103 103  CO2 22 - 32 mmol/L 29 27 27   Calcium 8.9 - 10.3 mg/dL 8.6(L) 8.9 8.4(L)  Total Protein 6.5 - 8.1 g/dL 5.8(L) 6.4(L) 5.6(L)  Total Bilirubin 0.3 - 1.2 mg/dL 0.5 0.8 0.7  Alkaline Phos 38 - 126 U/L 52 56 53  AST 15 - 41 U/L 19 17 14(L)  ALT 0 - 44 U/L 22 16 15     Lab Results  Component Value Date   WBC 8.6 03/31/2020   HGB 12.5 03/31/2020   HCT 39.2 03/31/2020   MCV 99.0 03/31/2020   PLT 170 03/31/2020   NEUTROABS 6.8 03/31/2020    CT SOFT TISSUE NECK W CONTRAST  Addendum Date: 03/11/2020   ADDENDUM REPORT:  03/11/2020 16:36 ADDENDUM: Study discussed by telephone with Dr. Jolaine Artist MEMON on 03/11/2020 at 1631 hours. Electronically Signed   By: Genevie Ann M.D.   On: 03/11/2020 16:36   Result Date: 03/11/2020 CLINICAL DATA:  59 year old female with dysphagia. Tonsillitis suspected. EXAM: CT NECK WITH CONTRAST TECHNIQUE: Multidetector CT imaging of the neck was performed using the standard protocol following the bolus administration of intravenous contrast. CONTRAST:  44mL OMNIPAQUE IOHEXOL 300 MG/ML  SOLN COMPARISON:  Chest CT 01/28/2019. FINDINGS: Pharynx and larynx: Bulky soft tissue mass throughout the bilateral supraglottic larynx and affecting the hypopharynx. Lobulated diffuse thickening of the epiglottis (series 2, image 53). Diffuse false cord and anterior commissure involvement with evidence of extension into the right paraglottic space on series 2 image 61 - where nodular direct extension of tumor and/or inseparable abnormal right level 3 lymph node protrudes on coronal image 58. Asymmetric erosion of the undersurface of the right thyroid cartilage on image 65. Asymmetric sclerosis of the right arytenoid on image 67., and midline extension of tumor is suspected through the anterior commissure a distance of about 11 mm as seen on series 2, image 67 and sagittal image 48. All told, tumor size is estimated at 37 x 52 by 45 mm (AP  by transverse by CC). There is evidence of airway compromise. The true cords may be spared as seen on series 2, image 71. Subglottic larynx is within normal limits. Above the vallecula pharyngeal contours appear normal. Normal superior parapharyngeal and retropharyngeal spaces. Salivary glands: Negative sublingual space. Submandibular glands and parotid glands remain within normal limits. Thyroid: Negative. Lymph nodes: Malignant left level IIIb lymph node measures 17 mm short axis and 29 mm long axis (series 2, image 55 and coronal image 73). As stated above it is possible of malignant  right level IIIa lymph node is inseparable from the parent tumor along the right paraglottic space (coronal image 58). Smaller but asymmetrically enlarged left level 4 nodes measure up to 9 mm short axis (coronal image 72). No other abnormal or suspicious lymph nodes identified. Vascular: Major vascular structures in the neck and at the skull base remain patent including both internal jugular veins. The left vertebral artery appears dominant. Calcified atherosclerosis at the skull base. Limited intracranial: Negative. Visualized orbits: Negative. Mastoids and visualized paranasal sinuses: Clear. Skeleton: Absent and carious dentition. Cervical spine degeneration. No suspicious osseous lesion identified. 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 No superior mediastinal lymphadenopathy. Visible axillary lymph nodes are normal. Mild dependent atelectasis.  No upper lung nodule identified. IMPRESSION: 1. 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 Recommend ENT consultation. Involvement of right laryngeal cartilages, with extension in the midline anterior to the strap muscles, and also extension through the right paraglottic space and/or inseparable malignant right level IIIa lymph node. Estimated tumor long axis 5.2 cm. 2. Malignant contralateral left level 3b lymph node is 2.9 cm long axis. Smaller indeterminate left level 4 lymph nodes. 3. No distant metastatic disease identified in the neck or upper chest. Electronically Signed: By: Genevie Ann M.D. On: 03/11/2020 16:23   IR GASTROSTOMY TUBE MOD SED  Result Date: 03/22/2020 INDICATION: Head and neck malignancy, dysphagia EXAM: FLUOROSCOPIC 16 FRENCH BALLOON RETENTION GASTROSTOMY MEDICATIONS: 1 G VANCOMYCIN; Antibiotics were administered within 1 hour of the procedure. GLUCAGON 0.5 MG IV ANESTHESIA/SEDATION: Versed 0 mg IV; Fentanyl 50 mcg IV Moderate Sedation Time:  12 MINUTES The patient was continuously  monitored during the procedure by the interventional radiology nurse under my direct supervision. CONTRAST:  10 CC-administered into the gastric lumen. FLUOROSCOPY TIME:  Fluoroscopy Time: 1 minutes 36 seconds (53 mGy). COMPLICATIONS: None immediate. PROCEDURE: Informed written consent was obtained from the patient after a thorough discussion of the procedural risks, benefits and alternatives. All questions were addressed. Maximal Sterile Barrier Technique was utilized including caps, mask, sterile gowns, sterile gloves, sterile drape, hand hygiene and skin antiseptic. A timeout was performed prior to the initiation of the procedure. Previous imaging reviewed. Patient has an existing feeding tube within the stomach. This was utilized to insufflate the stomach with air. The stomach was localized beneath the left subcostal margin in the left abdomen with biplane fluoroscopy. Overlying skin marked. Under sterile conditions and local anesthesia, introducer needle was advanced into the stomach. Contrast injection confirms percutaneous needle access within the stomach. Single T tack deployed for gastropexy. Amplatz guidewire inserted. Tract dilatation performed to insert the 16 French balloon retention gastrostomy. Balloon tip inflated with 7 cc mixture of saline and 1 cc contrast. This was retracted against the anterior gastric wall. Contrast injection confirms position in the stomach. Images obtained for documentation. T tacks secured to the skin site. A sterile dressing applied. No immediate complication. Patient tolerated the procedure well. IMPRESSION: Successful  fluoroscopic 34 French balloon retention gastrostomy Electronically Signed   By: Jerilynn Mages.  Shick M.D.   On: 03/22/2020 16:53   CT CHEST ABDOMEN PELVIS W CONTRAST  Result Date: 03/15/2020 CLINICAL DATA:  New diagnosis of head neck mass EXAM: CT CHEST, ABDOMEN, AND PELVIS WITH CONTRAST TECHNIQUE: Multidetector CT imaging of the chest, abdomen and pelvis was  performed following the standard protocol during bolus administration of intravenous contrast. CONTRAST:  145mL OMNIPAQUE IOHEXOL 300 MG/ML  SOLN COMPARISON:  Prior swallowing evaluation and CT of the neck. FINDINGS: CT CHEST FINDINGS Cardiovascular: Calcified coronary artery disease. Aorta is normal caliber. 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 Central pulmonary vasculature is mildly engorged. Approximately 3 cm greatest caliber unchanged from previous exam. Unremarkable on limited venous phase assessment. Mediastinum/Nodes: Supraglottic mass partially imaged on the first image of the data set. Interval placement of tracheostomy tube with resultant pneumomediastinum in the anterior mediastinum in upper mediastinum. No adenopathy within the mediastinum or axilla. No hilar adenopathy. Rounded partial necrotic lymph node in the LEFT neck partially visualized, corresponding to level IIIb lymph node seen on the neck CT. Lungs/Pleura: Basilar consolidative changes bilaterally with similar pattern of consolidation though decreased volume loss when compared to the study of September of 2020. Airways are patent. Musculoskeletal: See below for full musculoskeletal detail. CT ABDOMEN PELVIS FINDINGS Hepatobiliary: Liver without focal lesion. Post cholecystectomy. No biliary duct dilation. Pancreas: Mild atrophy of the pancreas without focal lesion, ductal dilation or inflammation. Spleen: Spleen normal in size and contour. Adrenals/Urinary Tract: Adrenal glands are normal. Symmetric renal enhancement. No hydronephrosis. LEFT renal cysts, largest arising from the lower pole measuring 2.8 x 2.9 cm. Urinary bladder under distended limiting assessment. Stomach/Bowel: Gastrointestinal tract with signs of colonic diverticulosis. No acute bowel process. Rectus diastasis. Postoperative changes in the midline of the abdomen related to prior ventral hernia repair. Mild bulging at the site of  hernia repair, no herniation beyond the inserted mesh near the umbilicus. Vascular/Lymphatic: Calcified atheromatous plaque in the abdominal aorta. No aneurysmal dilation. There is no gastrohepatic or hepatoduodenal ligament lymphadenopathy. No retroperitoneal or mesenteric lymphadenopathy. No pelvic sidewall lymphadenopathy. Reproductive: No adnexal mass. Reproductive structures are unremarkable. Other: Post abdominal wall reconstruction with rectus diastasis, no frank hernia. Musculoskeletal: Spinal degenerative changes. Degenerative changes in glenohumeral joints and hips. Ununited fractures along posterior LEFT chest involving ribs 10 through 12. These appear subacute or chronic. These did not appear to be present on previous imaging from September of 2020. IMPRESSION: 1. Supraglottic mass partially imaged on the first image of the data set. No signs of metastatic disease to the chest, abdomen or pelvis. 2. Rounded partially necrotic lymph node in the LEFT neck partially visualized, corresponding to level IIIb lymph node seen on the neck CT. 3. Basilar consolidative changes bilaterally with similar pattern of consolidation though decreased volume loss when compared to the study of September of 2020. Findings may be related to aspiration. No definite lesions seen in the chest though these areas could obscure underlying lesions. 4. Postoperative changes with small amount of pneumomediastinum presumably related to recent tracheostomy tube insertion. 5. Ununited fractures along posterior LEFT chest involving ribs 10 through 12. These appear subacute or chronic. These did not appear to be present on previous imaging from September of 2020. 6. Post abdominal wall reconstruction with rectus diastasis, no frank hernia. 7. Aortic atherosclerosis. Aortic Atherosclerosis (ICD10-I70.0). Electronically Signed   By: Zetta Bills M.D.   On: 03/15/2020 10:53   DG  CHEST PORT 1 VIEW  Result Date: 03/31/2020 CLINICAL DATA:   Pleural effusion EXAM: PORTABLE CHEST 1 VIEW COMPARISON:  03/29/2020 FINDINGS: Tracheostomy unchanged. Lung volumes are small, however, pulmonary insufflation remains stable since prior examination. Bilateral pleural effusions are present small and decreased in size since prior examination. Ovoid opacity within the right mid lung zone likely represents fluid within the fissure. There is bibasilar atelectasis. Cardiac size is within normal limits. No acute bone abnormality. IMPRESSION: Slight interval decrease in small bilateral pleural effusions with associated bibasilar atelectasis. Pulmonary hypoinflation. Electronically Signed   By: Fidela Salisbury MD   On: 03/31/2020 05:41   DG CHEST PORT 1 VIEW  Result Date: 03/29/2020 CLINICAL DATA:  Dyspnea EXAM: PORTABLE CHEST 1 VIEW COMPARISON:  03/25/2020 FINDINGS: Tracheostomy unchanged. Pulmonary insufflation is symmetric though has decreased slightly since prior examination. Small bilateral pleural effusions have developed with associated bibasilar atelectasis. No pneumothorax. Cardiac size within normal limits. Pulmonary vascularity is normal. No acute bone abnormality. IMPRESSION: Interval development of small bilateral pleural effusions with bibasilar compressive atelectasis. Electronically Signed   By: Fidela Salisbury MD   On: 03/29/2020 06:00   DG CHEST PORT 1 VIEW  Result Date: 03/25/2020 CLINICAL DATA:  Hypoxia, shortness of breath, altered mental status history COPD, hypertension, history laryngeal carcinoma, tracheostomy, undergoing radiation therapy EXAM: PORTABLE CHEST 1 VIEW COMPARISON:  Portable exam 6606 hours compared to 03/23/2020 FINDINGS: Tracheostomy tube projects over tracheal air column. Normal heart size, mediastinal contours, and pulmonary vascularity. Improving opacity at LEFT lung base which could represent atelectasis or consolidation. Subsegmental atelectasis RIGHT middle lobe slightly improved. Tiny LEFT pleural effusion. Remaining  lungs clear. No pneumothorax. Bones demineralized. IMPRESSION: Persistent subsegmental atelectasis at RIGHT base with improving atelectasis versus consolidation and tiny pleural effusion at LEFT base. Electronically Signed   By: Lavonia Dana M.D.   On: 03/25/2020 10:33   DG CHEST PORT 1 VIEW  Result Date: 03/23/2020 CLINICAL DATA:  Hypoxia. EXAM: PORTABLE CHEST 1 VIEW COMPARISON:  Left 19 2021. FINDINGS: Interim removal of feeding tube. Tracheostomy tube in stable position. Cardiomegaly. Persistent bibasilar atelectasis/infiltrates and small bilateral pleural effusions. Density noted over the right mid chest may represent atelectasis and or pleural fluid pseudotumor, no interim change. IMPRESSION: 1. Interim removal of feeding tube. Tracheostomy tube in stable position. 2. Stable cardiomegaly. 3. Persistent bibasilar atelectasis/infiltrates and small bilateral pleural effusions. Density of the right mid chest may represent atelectasis and or pleural fluid pseudotumor, no interim change. Electronically Signed   By: Marcello Moores  Register   On: 03/23/2020 06:39   DG CHEST PORT 1 VIEW  Result Date: 03/19/2020 CLINICAL DATA:  Pneumonia. EXAM: PORTABLE CHEST 1 VIEW COMPARISON:  03/18/2020. FINDINGS: Tracheostomy tube, feeding tube in stable position. Stable cardiomegaly. Improved pulmonary venous congestion. Lung volumes. Persistent left base atelectasis/infiltrate. Persistent right base subsegmental atelectasis. Elliptical density noted over the right mid chest may represent atelectasis and or fissural fluid. Pleural effusions cannot be excluded. No pneumothorax. IMPRESSION: 1. Lines and tubes in stable position. 2. Stable cardiomegaly. Improved pulmonary venous congestion. 3. Persistent left base atelectasis/infiltrate. Persistent right base subsegmental atelectasis. Elliptical density noted over the right mid chest may represent atelectasis and or fissural fluid. Small pleural effusions cannot be excluded.  Electronically Signed   By: Marcello Moores  Register   On: 03/19/2020 06:03   DG Chest Port 1 View  Result Date: 03/18/2020 CLINICAL DATA:  Respiratory failure. EXAM: PORTABLE CHEST 1 VIEW COMPARISON:  03/17/2020. FINDINGS: Tracheostomy tube and feeding tube in stable position.  Cardiomegaly. Mild pulmonary venous congestion. Mild bilateral interstitial prominence. Mild component of CHF cannot be excluded. Persistent left lower lobe atelectasis/infiltrate. New onset right mid lung prominent atelectatic changes. No pneumothorax. IMPRESSION: 1. Tracheostomy tube and feeding tube in stable position. 2. Cardiomegaly with mild pulmonary venous congestion and mild bilateral interstitial prominence. Mild component of CHF cannot be excluded. 3. Persistent left lower lobe atelectasis/infiltrate. New onset of right mid lung prominent atelectatic changes. Electronically Signed   By: Marcello Moores  Register   On: 03/18/2020 07:34   DG CHEST PORT 1 VIEW  Result Date: 03/17/2020 CLINICAL DATA:  Respiratory failure EXAM: PORTABLE CHEST 1 VIEW COMPARISON:  March 14, 2020 FINDINGS: Tracheostomy catheter tip is 6.2 cm above the carina. Feeding tube tip is below the diaphragm. No pneumothorax. There is a small left pleural effusion with consolidation in the left lower lobe. There is right base atelectasis with equivocal small right pleural effusion. Heart is mildly enlarged, stable, with pulmonary vascularity normal. No adenopathy. No bone lesions. IMPRESSION: Tube positions as described without pneumothorax. Left pleural effusion with equivocal right pleural effusion. Airspace opacity consistent with combination of atelectasis and pneumonia left lower lobe. Mild atelectasis right base. Stable cardiac prominence. Electronically Signed   By: Lowella Grip III M.D.   On: 03/17/2020 09:02   DG CHEST PORT 1 VIEW  Result Date: 03/14/2020 CLINICAL DATA:  Respiratory failure with hypercapnia EXAM: PORTABLE CHEST 1 VIEW COMPARISON:   March 10, 2020 FINDINGS: Tracheostomy catheter tip is 6.2 cm above the carina. There is ill-defined airspace opacity in the left upper lobe and left base regions with equivocal left pleural effusion. The right lung is clear. Heart is upper normal in size with pulmonary vascularity normal. No adenopathy. No bone lesions. IMPRESSION: Tracheostomy as described without pneumothorax. Patchy airspace opacity consistent with scattered areas of pneumonia in the left upper lobe and left base. Equivocal left pleural effusion. Right lung clear. Stable cardiac silhouette. Electronically Signed   By: Lowella Grip III M.D.   On: 03/14/2020 09:17   DG Chest Portable 1 View  Result Date: 03/10/2020 CLINICAL DATA:  Shortness of breath and cough EXAM: PORTABLE CHEST 1 VIEW COMPARISON:  January 23, 2020 FINDINGS: There is bibasilar atelectatic change. Elsewhere the interstitium is mildly thickened. No consolidation. Heart size and pulmonary vascularity are normal. No adenopathy. There is degenerative change in each shoulder. IMPRESSION: Bibasilar atelectasis. Interstitial thickening likely represents a degree of underlying chronic bronchitis. No edema or airspace opacity. Heart size within normal limits. Electronically Signed   By: Lowella Grip III M.D.   On: 03/10/2020 12:42   DG Swallowing Func-Speech Pathology  Result Date: 03/27/2020 Objective Swallowing Evaluation: Type of Study: MBS-Modified Barium Swallow Study  Patient Details Name: ZIARE ORRICK MRN: [id number] Date of Birth: 25-Dec-1960 Today's Date: 03/27/2020 Time: SLP Start Time (ACUTE ONLY): 1215 -SLP Stop Time (ACUTE ONLY): 5621 SLP Time Calculation (min) (ACUTE ONLY): 20 min Past Medical History: Past Medical History: Diagnosis Date  Bronchitis   Class 3 obesity 01/23/2020  COPD (chronic obstructive pulmonary disease) ([place])   Degenerative disc disease, lumbar   Hiatal hernia   Hypertension  Past Surgical History: Past Surgical History:  Procedure Laterality Date  CHOLECYSTECTOMY    degenerative bone disease    DIRECT LARYNGOSCOPY N/A 03/13/2020  Procedure: DIRECT LARYNGOSCOPY WITH BIOPSY;  Surgeon: Jason Coop, DO;  Location: [facility];  Service: ENT;  Laterality: N/A;  [place] N/A 01/15/2019  Procedure: [name] HERNIORRHAPHY WITH MESH;  Surgeon: Arnoldo Morale,  Elta Guadeloupe, MD;  Location: [facility];  Service: General;  Laterality: N/A;  IR GASTROSTOMY TUBE MOD SED  03/22/2020  OMENTECTOMY N/A 01/15/2019  Procedure: OMENTECTOMY;  Surgeon: [name], MD;  Location: [facility];  Service: General;  Laterality: N/A;  TRACHEOSTOMY TUBE PLACEMENT N/A 03/13/2020  Procedure: AWAKE TRACHEOSTOMY;  Surgeon: Jason Coop, DO;  Location: [facility];  Service: ENT;  Laterality: N/A; HPI: Audris Speaker is a 59 y/o F with history of COPD,chronic respiratory failure on home oxygen at 5 L/min, with large laryngeal mass lesion, now s/p tracheostomy with Shiley 6-0 and direct laryngoscopy and biopsy. 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  Pt is also s/p PEG for nutrition.  Plans are for chemo and radiation concurrently starting while in hospital.  Prior medical history includes smoking and some dyspnea if walking long distanced.  Subjective: pt awake in chair Assessment / Plan / Recommendation CHL IP CLINICAL IMPRESSIONS 03/27/2020 Clinical Impression Pt continues with with mild pharyngeal phase dysphagia - obstructive due to mass impacting epiglottic deflection.  In addition, she requires extra time to masticate solids due to dentition.  Swallow trigger is timely with minimal laryngeal penetration of liquid into larynx during the swallow due to decreased epiglottic deflection.  Min vallecular residue with solids without pt awareness - liquid wash assisted to clear.  Pt with prominent cricopharyngeus and appears with trace backflow near this region but view was suboptimal.  Recommend D3/mech soft and thin liquids, po  medications whole in puree and follow with liquid wash.  Of note, following testing, pt did cough and slight whitish coating noted on secretions but no aspiration noted during testing.  Suspect pt has some low grade chronic secretion aspiration. SLP Visit Diagnosis Dysphagia, oropharyngeal phase (R13.12) Attention and concentration deficit following -- Frontal lobe and executive function deficit following -- Impact on safety and function Mild aspiration risk   CHL IP TREATMENT RECOMMENDATION 03/27/2020 Treatment Recommendations Therapy as outlined in treatment plan below   Prognosis 03/27/2020 Prognosis for Safe Diet Advancement Fair Barriers to Reach Goals Other (Comment) Barriers/Prognosis Comment -- CH[facility] IP DIET RECOMMENDATION 03/27/2020 SLP Diet Recommendations Dysphagia 3 (Mech soft) solids;Thin liquid Liquid Administration via -- Medication Administration Whole meds with puree Compensations Slow rate;Small sips/bites Postural Changes --   CHL IP OTHER RECOMMENDATIONS 03/27/2020 Recommended Consults (No Data) Oral Care Recommendations Oral care BID Other Recommendations Clarify dietary restrictions   CH[facility] IP FOLLOW UP RECOMMENDATIONS 03/27/2020 Follow up Recommendations Skilled Nursing facility   [facility] IP FREQUENCY AND DURATION 03/27/2020 Speech Therapy Frequency (ACUTE ONLY) min 1 x/week Treatment Duration 1 week      CHL IP ORAL PHASE 03/27/2020 Oral Phase WFL Oral - Pudding Teaspoon -- Oral - Pudding Cup -- Oral - Honey Teaspoon -- Oral - Honey Cup -- Oral - Nectar Teaspoon -- Oral - Nectar Cup -- Oral - Nectar Straw -- Oral - Thin Teaspoon -- Oral - Thin Cup -- Oral - Thin Straw -- Oral - Puree -- Oral - Mech Soft -- Oral - Regular -- Oral - Multi-Consistency -- Oral - Pill -- Oral Phase - Comment --  CH[facility] IP PHARYNGEAL PHASE 03/27/2020 Pharyngeal Phase -- Pharyngeal- Pudding Teaspoon -- Pharyngeal -- Pharyngeal- Pudding Cup -- Pharyngeal -- Pharyngeal- Honey Teaspoon -- Pharyngeal -- Pharyngeal- Honey Cup --  Pharyngeal -- Pharyngeal- Nectar Teaspoon -- Pharyngeal -- Pharyngeal- Nectar Cup -- Pharyngeal -- Pharyngeal- Nectar Straw Reduced epiglottic inversion;Pharyngeal residue - valleculae Pharyngeal -- Pharyngeal- Thin Teaspoon Reduced epiglottic inversion;Pharyngeal residue - valleculae Pharyngeal --  Pharyngeal- Thin Cup Pharyngeal residue - valleculae;Reduced epiglottic inversion;Penetration/Aspiration during swallow Pharyngeal Material enters airway, remains ABOVE vocal cords then ejected out Pharyngeal- Thin Straw Reduced epiglottic inversion;Pharyngeal residue - valleculae;Penetration/Aspiration during swallow Pharyngeal Material enters airway, remains ABOVE vocal cords then ejected out Pharyngeal- Puree WFL Pharyngeal -- Pharyngeal- Mechanical Soft -- Pharyngeal -- Pharyngeal- Regular Reduced epiglottic inversion Pharyngeal -- Pharyngeal- Multi-consistency -- Pharyngeal -- Pharyngeal- Pill NT Pharyngeal -- Pharyngeal Comment --  CHL IP CERVICAL ESOPHAGEAL PHASE 03/27/2020 Cervical Esophageal Phase -- Pudding Teaspoon -- Pudding Cup -- Honey Teaspoon -- Honey Cup -- Nectar Teaspoon -- Nectar Cup -- Nectar Straw -- Thin Teaspoon -- Thin Cup Prominent cricopharyngeal segment Thin Straw -- Puree -- Mechanical Soft -- Regular -- Multi-consistency -- Pill -- Cervical Esophageal Comment appearance of potential minimal backflow of liquid near UES region = suboptimal view - did not backflow into pharynx/larynx [name], MS [facility] SLP Acute Rehab Services Office [phone] Pager [phone] [name] 03/27/2020, 2:31 PM              DG Swallowing Func-Speech Pathology  Result Date: 03/11/2020 Objective Swallowing Evaluation: Type of Study: MBS-Modified Barium Swallow Study  Patient Details Name: TASHIANNA BROOME MRN: [id number] Date of Birth: 1960/06/30 Today's Date: 03/11/2020 Time: SLP Start Time (ACUTE ONLY): 5465 -SLP Stop Time (ACUTE ONLY): 6812 SLP Time Calculation (min) (ACUTE ONLY): 21 min Past Medical  History: Past Medical History: Diagnosis Date  Bronchitis   Class 3 obesity 01/23/2020  COPD (chronic obstructive pulmonary disease) ([place])   Degenerative disc disease, lumbar   Hiatal hernia   Hypertension  Past Surgical History: Past Surgical History: Procedure Laterality Date  CHOLECYSTECTOMY    degenerative bone disease    INCISIONAL HERNIA REPAIR N/A 01/15/2019  Procedure: INCISIONAL HERNIORRHAPHY WITH MESH;  Surgeon: [name], MD;  Location: [facility];  Service: General;  Laterality: N/A;  OMENTECTOMY N/A 01/15/2019  Procedure: OMENTECTOMY;  Surgeon: [name], MD;  Location: [facility];  Service: General;  Laterality: N/A; HPI: Desteni Piscopo  is a 59 y.o. female, with medical history significant of class III obesity, hiatal hernia, hypertension, bronchitis, COPD, chronic respiratory failure on home oxygen at 5 LPM, active smoker of 1-1/2 packs of cigarettes per day , patient presents to ED secondary to shortness of breath and altered mental status, patient with receptive worsening dyspnea over the last 4 days, she is not vaccinated against Covid, she is altered at the time of my examination, history was obtained from boyfriend at bedside, and ED staff, patient still smoking, she still using 5 L of oxygen, he is with increased work of breathing, significantly dyspneic, upon presentation to ED she is altered not provide any complaints at this point. BSE requested.  Subjective: "I haven't been able to swallow my pills for a couple weeks." Assessment / Plan / Recommendation CHL IP CLINICAL IMPRESSIONS 03/11/2020 Clinical Impression Pt presents with mild pharyngeal phase dysphagia, however appearance of anatomy appears edematous (epiglottis and aryepiglottic folds). Pt with limited dentition and requires extra time to masticate solids, swallow trigger generally at the level of the valleculae, Pt with blunted appearance of epiglottis with reduced deflection resulting in variable trace, flash penetration of  thins during the swallow without aspiration and min vallecular residue with solids and brief stasis of barium tablet in valleculae. Pt with prominent cricopharyngeus. Recommend D3/mech soft and thin liquids, po medications whole in puree and follow with liquid wash or per Pt preference. Also strongly recommend additional imaging (neck CT) and ENT  consult pending those results. SLP will follow pending results of imaging. Above to RN and MD.  SLP Visit Diagnosis Dysphagia, oropharyngeal phase (R13.12) Attention and concentration deficit following -- Frontal lobe and executive function deficit following -- Impact on safety and function Mild aspiration risk   CHL IP TREATMENT RECOMMENDATION 03/11/2020 Treatment Recommendations No treatment recommended at this time   Prognosis 03/11/2020 Prognosis for Safe Diet Advancement Fair Barriers to Reach Goals Severity of deficits Barriers/Prognosis Comment -- CHL IP DIET RECOMMENDATION 03/11/2020 SLP Diet Recommendations Dysphagia 3 (Mech soft) solids;Thin liquid Liquid Administration via Cup;Straw Medication Administration Whole meds with puree Compensations Slow rate;Small sips/bites Postural Changes Remain semi-upright after after feeds/meals (Comment);Seated upright at 90 degrees   CHL IP OTHER RECOMMENDATIONS 03/11/2020 Recommended Consults Consider ENT evaluation Oral Care Recommendations Oral care BID Other Recommendations Clarify dietary restrictions   CHL IP FOLLOW UP RECOMMENDATIONS 03/11/2020 Follow up Recommendations None   CHL IP FREQUENCY AND DURATION 03/11/2020 Speech Therapy Frequency (ACUTE ONLY) min 2x/week Treatment Duration 1 week      CHL IP ORAL PHASE 03/11/2020 Oral Phase WFL Oral - Pudding Teaspoon -- Oral - Pudding Cup -- Oral - Honey Teaspoon -- Oral - Honey Cup -- Oral - Nectar Teaspoon -- Oral - Nectar Cup -- Oral - Nectar Straw -- Oral - Thin Teaspoon -- Oral - Thin Cup -- Oral - Thin Straw -- Oral - Puree -- Oral - Mech Soft -- Oral - Regular --  Oral - Multi-Consistency -- Oral - Pill -- Oral Phase - Comment --  CHL IP PHARYNGEAL PHASE 03/11/2020 Pharyngeal Phase Impaired Pharyngeal- Pudding Teaspoon -- Pharyngeal -- Pharyngeal- Pudding Cup -- Pharyngeal -- Pharyngeal- Honey Teaspoon -- Pharyngeal -- Pharyngeal- Honey Cup -- Pharyngeal -- Pharyngeal- Nectar Teaspoon -- Pharyngeal -- Pharyngeal- Nectar Cup -- Pharyngeal -- Pharyngeal- Nectar Straw Pharyngeal residue - valleculae;Reduced epiglottic inversion Pharyngeal -- Pharyngeal- Thin Teaspoon Delayed swallow initiation-vallecula;Reduced epiglottic inversion Pharyngeal -- Pharyngeal- Thin Cup Reduced epiglottic inversion;Penetration/Aspiration during swallow Pharyngeal Material enters airway, remains ABOVE vocal cords then ejected out Pharyngeal- Thin Straw Reduced epiglottic inversion;Penetration/Aspiration during swallow Pharyngeal Material enters airway, remains ABOVE vocal cords then ejected out Pharyngeal- Puree WFL Pharyngeal -- Pharyngeal- Mechanical Soft -- Pharyngeal -- Pharyngeal- Regular Pharyngeal residue - valleculae;Reduced epiglottic inversion Pharyngeal -- Pharyngeal- Multi-consistency -- Pharyngeal -- Pharyngeal- Pill Pharyngeal residue - valleculae;Reduced epiglottic inversion Pharyngeal -- Pharyngeal Comment edematous pharynx  CHL IP CERVICAL ESOPHAGEAL PHASE 03/11/2020 Cervical Esophageal Phase Impaired Pudding Teaspoon -- Pudding Cup -- Honey Teaspoon -- Honey Cup -- Nectar Teaspoon -- Nectar Cup -- Nectar Straw -- Thin Teaspoon -- Thin Cup Prominent cricopharyngeal segment Thin Straw -- Puree -- Mechanical Soft -- Regular -- Multi-consistency -- Pill -- Cervical Esophageal Comment -- Thank you, [name], [place] 03/11/2020, 2:12 PM              ECHOCARDIOGRAM COMPLETE  Result Date: 03/11/2020    ECHOCARDIOGRAM REPORT   Patient Name:   VANNESSA GODOWN Date of Exam: 03/11/2020 Medical Rec #:  [id number]       Height:       65.0 in Accession #:     [id number]      Weight:       244.0 lb Date of Birth:  21-Dec-1960       BSA:          2.153 m Patient Age:    7 years        BP:  115/35 mmHg Patient Gender: F               HR:           57 bpm. Exam Location:  [facility] Procedure: 2D Echo Indications:    Dyspnea 786.09 / R06.00  History:        Patient has prior history of Echocardiogram examinations, most                 recent 01/16/2019. COPD; Risk Factors:Hypertension and Current                 Smoker. Acute respiratory failure with hypoxia.  Sonographer:    Leavy Cella RDCS (AE) Referring Phys: [id number] [place] [address] [place] IMPRESSIONS  1. Left ventricular ejection fraction, by estimation, is 70 to 75%. The left ventricle has hyperdynamic function. The left ventricle has no regional wall motion abnormalities. There is mild left ventricular hypertrophy. Left ventricular diastolic parameters were normal.  2. Right ventricular systolic function is normal. The right ventricular size is normal. Tricuspid regurgitation signal is inadequate for assessing PA pressure.  3. The mitral valve is grossly normal. Trivial mitral valve regurgitation.  4. The aortic valve is tricuspid. Aortic valve regurgitation is not visualized.  5. The inferior vena cava is normal in size with greater than 50% respiratory variability, suggesting right atrial pressure of 3 mmHg. FINDINGS  Left Ventricle: Left ventricular ejection fraction, by estimation, is 70 to 75%. The left ventricle has hyperdynamic function. The left ventricle has no regional wall motion abnormalities. The left ventricular internal cavity size was normal in size. There is mild left ventricular hypertrophy. Left ventricular diastolic parameters were normal. Right Ventricle: The right ventricular size is normal. No increase in right ventricular wall thickness. Right ventricular systolic function is normal. Tricuspid regurgitation signal is inadequate for assessing PA pressure. Left Atrium: Left atrial size was  normal in size. Right Atrium: Right atrial size was normal in size. Pericardium: There is no evidence of pericardial effusion. Mitral Valve: The mitral valve is grossly normal. Trivial mitral valve regurgitation. Tricuspid Valve: The tricuspid valve is grossly normal. Tricuspid valve regurgitation is trivial. Aortic Valve: The aortic valve is tricuspid. Aortic valve regurgitation is not visualized. Pulmonic Valve: The pulmonic valve was not well visualized. Pulmonic valve regurgitation is not visualized. Aorta: The aortic root is normal in size and structure. Venous: The inferior vena cava is normal in size with greater than 50% respiratory variability, suggesting right atrial pressure of 3 mmHg. IAS/Shunts: No atrial level shunt detected by color flow Doppler.  LEFT VENTRICLE PLAX 2D LVIDd:         4.05 cm  Diastology LVIDs:         2.30 cm  LV e' medial:    8.59 cm/s LV PW:         1.35 cm  LV E/e' medial:  12.2 LV IVS:        1.34 cm  LV e' lateral:   11.00 cm/s LVOT diam:     2.00 cm  LV E/e' lateral: 9.5 LVOT Area:     3.14 cm  RIGHT VENTRICLE RV S prime:     15.40 cm/s TAPSE (M-mode): 2.9 cm LEFT ATRIUM             Index       RIGHT ATRIUM           Index LA diam:        4.70 cm 2.18 cm/m  RA  Area:     13.50 cm LA Vol (A2C):   73.3 ml 34.05 ml/m RA Volume:   35.40 ml  16.44 ml/m LA Vol (A4C):   39.3 ml 18.26 ml/m LA Biplane Vol: 55.6 ml 25.83 ml/m   AORTA Ao Root diam: 2.70 cm MITRAL VALVE MV Area (PHT): 2.69 cm     SHUNTS MV Decel Time: 282 msec     Systemic Diam: 2.00 cm MV E velocity: 105.00 cm/s MV A velocity: 57.80 cm/s MV E/A ratio:  1.82 Rozann Lesches MD Electronically signed by Rozann Lesches MD Signature Date/Time: 03/11/2020/4:59:31 PM    Final     ASSESSMENT AND PLAN: 59 year old Caucasian female, with medical history of heavy smoking, COPD on home oxygen 5 L/min, hypertension, presents with worsening dyspnea and altered mental status.  Work-up showed a large laryngeal mass.  Patient  required urgent tracheostomy.  1.  Supraglottic laryngeal cancer, [id number] 2. COPD with acute exacerbation, on chronic home oxygen 5 L/min 3. HTN  4.  Heavy smoking history  Recommendations:  She has T4aN2c supraglottic laryngeal cancer, with compromised airway, required urgent tracheostomy.  -For staging scan, we ideally would like to get a PET scan.  However this is not feasible while she is in hospital, and there is quite bit social difficult to get her treated as outpt (she lives an hour away and has no transportation). CT C/A/P performed did not show evidence of distant mets.  - Ideally for T4a tumors, total laryngectomy with bilateral neck dissection is the way to go but patient refused surgery. I did explain to her if patient declines surgery, we can proceed with concurrent chemo radiation although it is likely that she may need salvage surgery if she has incomplete response. Given baseline PS, she is not an ideal candidate for every 21 day cisplatin, hence we offered weekly cisplatin. Started concurrent CRT on 03/29/2020 Mild creatinine impairment noted, likely pre renal, given bump in BUN as well, could consider IVF it this continues to worsen. We will continue to monitor her periodically Please let us know about her disposition so we can arrange for FU outpatient. If she remains inpatient, she will be due for chemo coming Monday.  Benay Pike, MD

## 2020-03-31 NOTE — Progress Notes (Addendum)
Nutrition Follow-up  DOCUMENTATION CODES:   Morbid obesity  INTERVENTION:   -Ensure Plus TID PO, each provides 350 kcals and 13g protein -Provide "Dysphagia 3 diet" handout per pt request  -Placed lunch and dinner orders for patient   NUTRITION DIAGNOSIS:   Increased nutrient needs related to cancer and cancer related treatments, chronic illness as evidenced by estimated needs.  Ongoing.  GOAL:   Patient will meet greater than or equal to 90% of their needs  Meeting with TF + PO + supplements  MONITOR:   TF tolerance, PO intake, supplemental acceptance, Labs, Weight trends  ASSESSMENT:   59 year old female with history significant of hiatal hernia, HTN, bronchitis, COPD, and chronic respiratory failure on 5 L oxygen at night, presents with AMS and worsening dyspnea over the last 4 days.  11/11 MBS by SLP with abnormal larynx, CT neck with supraglottic mass 11/13 Direct laryngoscopy with biopsy and trach 11/14 intubated 11/15 CT chest/abdomen pelvis with no signs of metastatic disease, partially necrotic lymph node to left neck, Cortrak placed, transitioned to trach collar 11/17 transferred to [facility] for chemoradiation 11/22: PEG placement 11/27: MBS: SLP recommending D3 diet with thin liquids  Pt reports eating well. Per documentation, pt consuming 75-90% of meals at this time. Pt requested certain items for her lunch and dinner today. Ordered additional items to orders that weren't already in the system.  Pt with questions as to what she can have on the dysphagia 3 diet. Provided a handout and reviewed options on menu with pt.   Pt would like to mainly focus on consumption of meals and drink Ensure. Will discontinue bolus feeds at this time. Can always order again if PO decreases. Will continue to monitor POs. Will increase Ensure to TID.  Admission weight: 244 lbs. Current weight: 208 lbs (question if this is correct).  I/Os: -473 ml since 11/17 UOP: 975 ml x 24  hrs  Medications: Colace, Multivitamin with minerals daily  Labs reviewed: CBGs: 100-111 Elevated Phos Mg WNL  Diet Order:   Diet Order            DIET DYS 3 Room service appropriate? Yes; Fluid consistency: Thin  Diet effective now                 EDUCATION NEEDS:   Not appropriate for education at this time  Skin:  Skin Assessment: Skin Integrity Issues: Skin Integrity Issues:: Other (Comment) Other: Cellulits; LLE; MASD; R thigh  Last BM:  11/13  Height:   Ht Readings from Last 1 Encounters:  03/14/20 5\' 5"  (1.651 m)    Weight:   Wt Readings from Last 1 Encounters:  03/30/20 94.6 kg   BMI:  Body mass index is 34.71 kg/m.  Estimated Nutritional Needs:   Kcal:  2100-2300  Protein:  85-100g  Fluid:  2L/day  Clayton Bibles, MS, RD, LDN Inpatient Clinical Dietitian Contact information available via Amion

## 2020-03-31 NOTE — Progress Notes (Signed)
Patient ID: Veronica Horton, female   DOB: 1961/01/23, 59 y.o.   MRN: [id number]  PROGRESS NOTE    Veronica Horton  CVE:[id number] DOB: 12-14-1960 DOA: 03/10/2020 PCP: [facility]   Brief Narrative:  59 year old female with history of morbid obesity, hiatal hernia, hypertension, COPD, chronic respiratory failure on home oxygen at 5 L/min, smoker presented with worsening shortness of breath and altered mental status.  On presentation, ABG showed PCO2 of 92 requiring BiPAP. She was found to have laryngeal mass concerning for malignancy and she underwent biopsy which showed invasive squamous cell carcinoma and she required tracheostomy. She had to be transferred to ICU post biopsy and had to be kept on ventilator support. She was transferred [facility] 11/17 for radiation therapy. She was transferred to [facility] service from [facility] service on 03/20/2020. UnderwentPEG placementby IRon 03/22/2020. She started concurrent chemo radiationon 11/29.  Currently working on discharge planning.  Therapy recommending home health with 24 hr supervision/assistance.  CM currently working on [facility], some difficulty with finding accepting agency.   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 Found to have airway obstruction/compromise due to laryngeal mass. CT soft tissue neck on 11/11 showed bulky bilateral supraglottic tumor with possible airway compromise, involvement of right laryngeal cartilages. ENT was consulted. She underwent laryngeal biopsy which showed squamous cell carcinoma.  -Status post tracheostomy -UnderwentPEG placement by IR on 11/22. -CT chest/abdomen/pelvis did not show signs of metastatic disease. -She was recommended total laryngectomy with bilateral neck dissection but she declined surgery.  -Oncology/radiation oncologyfollowing.  Per oncology, started chemoradiation  11/29. -Diet as per SLP recommendations.  Continue tube feeding as per dietary -Consult palliative care for goals of care discussion  Acute on chronic hypoxic/hypercarbic respiratory failure: History of COPD, on oxygen at home at 5 L/min. Chronic respiratory failure exacerbated by laryngeal squamous cell carcinoma, COPD exacerbation, pneumonia.  -Hospital course was remarkable for Serratia pneumonia. Finished the course of antibiotics with levofloxacin.  -Currently on trach collar, currently requiring 10 L by trach collar on 40% FiO2, wean as tolerated.  PCCM following. -Repeat CXR 11/25 with persistent subsegmental atelectasis at R base with improving atelectasis vs consolidation and tiny effusion at L base -Repeat CXR 11/29 with interval small bilateral effusions -> received lasix 11/29 and 11/30, follow   COPD exacerbation:  -Continue pulmonary hygiene, ventilator support at night, trach collar during the day. Follow intermittent chest x-ray. Aspiration precautions.Steroids have been tapered off. No wheezing appreciated. -Continue nebs  Hypoglycemia: -Continue tube feeds and oral intake as tolerated  GERD: Continue PPI  Hypertension:Monitor blood pressure. Off antihypertensives which she was at home. Blood pressure okay.  AKI: Resolved  Thrombocytopenia: resolved.  Disposition:therapy now recommending HH and 24 hr supervision/assistance, CM working on home health, but apparently no accepting agency.  Will need to ensure she's able to manage trach and o2 needs at home as well.     DVT prophylaxis:lovenox Code Status: Full Family Communication:  None at bedside Disposition Plan: Status is: Inpatient  Remains inpatient appropriate because:Inpatient level of care appropriate due to severity of illness   Dispo: The patient is from: Home              Anticipated d/c is to: Home              Anticipated d/c date is: > 3 days  Patient currently is  not medically stable to d/c.   Consultants: PCCM/ENT/IR/oncology/radiation oncology  Procedures:  Intubation/extubation Tracheostomy PEG tube placement  Echo IMPRESSIONS    1. Left ventricular ejection fraction, by estimation, is 70 to 75%. The  left ventricle has hyperdynamic function. The left ventricle has no  regional wall motion abnormalities. There is mild left ventricular  hypertrophy. Left ventricular diastolic  parameters were normal.  2. Right ventricular systolic function is normal. The right ventricular  size is normal. Tricuspid regurgitation signal is inadequate for assessing  PA pressure.  3. The mitral valve is grossly normal. Trivial mitral valve  regurgitation.  4. The aortic valve is tricuspid. Aortic valve regurgitation is not  visualized.  5. The inferior vena cava is normal in size with greater than 50%  respiratory variability, suggesting right atrial pressure of 3 mmHg.   Antimicrobials:  Anti-infectives (From admission, onward)   Start     Dose/Rate Route Frequency Ordered Stop   03/22/20 1700  vancomycin (VANCOCIN) IVPB 1000 mg/200 mL premix  Status:  Discontinued        1,000 mg 200 mL/hr over 60 Minutes Intravenous  Once 03/22/20 1605 03/23/20 0236   03/22/20 1604  vancomycin (VANCOCIN) 1-5 GM/200ML-% IVPB       Note to Pharmacy: [name]   : cabinet override      03/22/20 1604 03/22/20 1615   03/14/20 1100  levofloxacin (LEVAQUIN) IVPB 750 mg        750 mg 100 mL/hr over 90 Minutes Intravenous Daily 03/14/20 0955 03/19/20 1146   03/10/20 1730  doxycycline (VIBRAMYCIN) 100 mg in sodium chloride 0.9 % 250 mL IVPB  Status:  Discontinued        100 mg 125 mL/hr over 120 Minutes Intravenous Every 12 hours 03/10/20 1634 03/14/20 0955   03/10/20 1600  azithromycin (ZITHROMAX) 500 mg in sodium chloride 0.9 % 250 mL IVPB  Status:  Discontinued        500 mg 250 mL/hr over 60 Minutes Intravenous Every 24 hours 03/10/20 1529 03/10/20 1538    03/10/20 1500  clindamycin (CLEOCIN) IVPB 600 mg  Status:  Discontinued        600 mg 100 mL/hr over 30 Minutes Intravenous  Once 03/10/20 1458 03/10/20 1521       Subjective: Patient seen and examined at bedside.  Not certain some questions.  Denies worsening shortness of breath.  No overnight fever or vomiting reported.  Objective: Vitals:   03/31/20 0553 03/31/20 0801 03/31/20 1110 03/31/20 1219  BP: 101/63   (!) 82/52  Pulse: (!) 54 (!) 54 86 72  Resp: 14 16 18 19   Temp: 98.5 F (36.9 C)   98.4 F (36.9 C)  TempSrc: Oral   Oral  SpO2: 95% 94% 96% 93%  Weight:      Height:        Intake/Output Summary (Last 24 hours) at 03/31/2020 1230 Last data filed at 03/31/2020 0640 Gross per 24 hour  Intake 474 ml  Output 375 ml  Net 99 ml   Filed Weights   03/28/20 0625 03/29/20 0500 03/30/20 0500  Weight: 113.4 kg 113.8 kg 94.6 kg    Examination:  General exam: Chronically ill looking female lying on bed.  No distress  ENT: Tracheostomy present; on trach collar  respiratory system: Bilateral decreased breath sounds at bases with scattered crackles Cardiovascular system: S1 & S2 heard, intermittently bradycardic  gastrointestinal system: Abdomen is obese, nondistended, soft and nontender. Normal bowel  sounds heard.  PEG tube present Extremities: No cyanosis, clubbing; trace lower extremity edema Central nervous system: Awake, nods her head to some questions.  No focal neurological deficits. Moving extremities Skin: No obvious ecchymosis/lesions Psychiatry: Flat affect    Data Reviewed: I have personally reviewed following labs and imaging studies  CBC: Recent Labs  Lab 03/27/20 0616 03/28/20 0646 03/29/20 0515 03/30/20 0819 03/31/20 0611  WBC 8.5 7.9 7.0 9.8 8.6  NEUTROABS 6.4 5.7 4.8 9.1* 6.8  HGB 13.0 12.3 12.1 13.3 12.5  HCT 40.0 38.0 37.7 41.1 39.2  MCV 98.3 98.7 99.0 97.9 99.0  PLT 158 152 149* 169 390   Basic Metabolic Panel: Recent Labs  Lab  03/27/20 0616 03/28/20 0646 03/29/20 0515 03/30/20 0819 03/31/20 0611  NA 140 139 138 138 135  K 4.4 4.1 4.4 4.9 4.8  CL 106 105 103 103 99  CO2 26 27 27 27 29   GLUCOSE 137* 134* 130* 124* 94  BUN 17 17 19  26* 34*  CREATININE 0.88 0.95 0.97 0.90 1.19*  CALCIUM 8.5* 8.4* 8.4* 8.9 8.6*  MG 2.4 2.3 2.3 2.7* 2.4  PHOS 3.5 4.4 4.4 4.1 5.4*   GFR: Estimated Creatinine Clearance: 57.1 mL/min (A) (by C-G formula based on SCr of 1.19 mg/dL (H)). Liver Function Tests: Recent Labs  Lab 03/27/20 0616 03/28/20 0646 03/29/20 0515 03/30/20 0819 03/31/20 0611  AST 14* 13* 14* 17 19  ALT 15 16 15 16 22   ALKPHOS 55 52 53 56 52  BILITOT 0.5 0.6 0.7 0.8 0.5  PROT 5.5* 5.4* 5.6* 6.4* 5.8*  ALBUMIN 2.7* 2.6* 2.7* 2.9* 2.9*   No results for input(s): LIPASE, AMYLASE in the last 168 hours. No results for input(s): AMMONIA in the last 168 hours. Coagulation Profile: No results for input(s): INR, PROTIME in the last 168 hours. Cardiac Enzymes: No results for input(s): CKTOTAL, CKMB, CKMBINDEX, TROPONINI in the last 168 hours. BNP (last 3 results) No results for input(s): PROBNP in the last 8760 hours. HbA1C: No results for input(s): HGBA1C in the last 72 hours. CBG: Recent Labs  Lab 03/30/20 2341 03/31/20 0423 03/31/20 0753 03/31/20 0844 03/31/20 1213  GLUCAP 144* 86 69* 74 111*   Lipid Profile: No results for input(s): CHOL, HDL, LDLCALC, TRIG, CHOLHDL, LDLDIRECT in the last 72 hours. Thyroid Function Tests: No results for input(s): TSH, T4TOTAL, FREET4, T3FREE, THYROIDAB in the last 72 hours. Anemia Panel: No results for input(s): VITAMINB12, FOLATE, FERRITIN, TIBC, IRON, RETICCTPCT in the last 72 hours. Sepsis Labs: No results for input(s): PROCALCITON, LATICACIDVEN in the last 168 hours.  Recent Results (from the past 240 hour(s))  Culture, respiratory (non-expectorated)     Status: None   Collection Time: 03/21/20  4:25 PM   Specimen: Tracheal Aspirate; Respiratory   Result Value Ref Range Status   Specimen Description   Final    TRACHEAL ASPIRATE Performed at [facility] [address] [place], [place] [address]    Special Requests   Final    NONE Performed at [facility], [place] [address], [place], [place] [address]    Gram Stain   Final    FEW WBC PRESENT, PREDOMINANTLY PMN FEW GRAM POSITIVE COCCI IN PAIRS IN CLUSTERS RARE GRAM NEGATIVE RODS    Culture   Final    Normal respiratory flora-no Staph aureus or Pseudomonas seen Performed at [facility], [address], [place], [place] [address]    Report Status 03/23/2020 FINAL  Final  Radiology Studies: DG CHEST PORT 1 VIEW  Result Date: 03/31/2020 CLINICAL DATA:  Pleural effusion EXAM: PORTABLE CHEST 1 VIEW COMPARISON:  03/29/2020 FINDINGS: Tracheostomy unchanged. Lung volumes are small, however, pulmonary insufflation remains stable since prior examination. Bilateral pleural effusions are present small and decreased in size since prior examination. Ovoid opacity within the right mid lung zone likely represents fluid within the fissure. There is bibasilar atelectasis. Cardiac size is within normal limits. No acute bone abnormality. IMPRESSION: Slight interval decrease in small bilateral pleural effusions with associated bibasilar atelectasis. Pulmonary hypoinflation. Electronically Signed   By: Fidela Salisbury MD   On: 03/31/2020 05:41        Scheduled Meds: . arformoterol  15 mcg Nebulization BID  . budesonide (PULMICORT) nebulizer solution  0.5 mg Nebulization BID  . chlorhexidine  15 mL Mouth Rinse BID  . Chlorhexidine Gluconate Cloth  6 each Topical Daily  . docusate  100 mg Per Tube BID  . enoxaparin (LOVENOX) injection  55 mg Subcutaneous Q24H  . feeding supplement  237 mL Oral TID BM  . feeding supplement (OSMOLITE 1.5 CAL)  237 mL Per Tube TID BM  . gabapentin  600 mg Oral QHS  . insulin aspart  0-9 Units Subcutaneous Q4H   . ipratropium-albuterol  3 mL Nebulization TID  . ketotifen  1 drop Both Eyes BID  . lidocaine  1 patch Transdermal Q24H  . mouth rinse  15 mL Mouth Rinse q12n4p  . melatonin  5 mg Oral QHS  . multivitamin with minerals  1 tablet Per Tube Daily  . nicotine  21 mg Transdermal Daily  . pantoprazole sodium  40 mg Per Tube Daily  . scopolamine  1 patch Transdermal Q72H   Continuous Infusions: . sodium chloride            Aline August, MD [facility] 03/31/2020, 12:30 PM

## 2020-04-01 ENCOUNTER — Ambulatory Visit
Admit: 2020-04-01 | Discharge: 2020-04-01 | Disposition: A | Discharge status: Home / Self Care | Payer: Medicaid Other | Attending: Radiation Oncology | Admitting: Radiation Oncology

## 2020-04-01 LAB — GLUCOSE, CAPILLARY
Glucose-Capillary: 112 mg/dL — ABNORMAL HIGH (ref 70–99)
Glucose-Capillary: 113 mg/dL — ABNORMAL HIGH (ref 70–99)
Glucose-Capillary: 118 mg/dL — ABNORMAL HIGH (ref 70–99)
Glucose-Capillary: 78 mg/dL (ref 70–99)
Glucose-Capillary: 80 mg/dL (ref 70–99)
Glucose-Capillary: 82 mg/dL (ref 70–99)

## 2020-04-01 NOTE — Procedures (Signed)
Tracheostomy Exchange Procedure Note  HILLERY ZACHMAN  [id number]  [birth year]  Date:04/01/20  Time:12:11 PM   Provider Performing:Pete E Kary Kos   Procedure: Tracheostomy Exchange Through Immature Stoma [phone])  Indication(s) Change to cuffless  Consent Risks of the procedure as well as the alternatives and risks of each were explained to the patient and/or caregiver.  Consent for the procedure was obtained and is signed in the bedside chart  Anesthesia None   Time Out Verified patient identification, verified procedure, site/side was marked, verified correct patient position, special equipment/implants available, medications/allergies/relevant history reviewed, required imaging and test results available.   Sterile Technique Hand hygiene, gloves   Procedure Description Size 6 cuffed existing Shiley removed and size 6 uncuffed Shiley placed through stoma. Pt does have very small ledge for trach   Complications/Tolerance None; patient tolerated the procedure well..   EBL Minimal   Will need to see her 1 month after [place] ACNP-BC [place] Pager # [phone] OR # [phone] if no answer

## 2020-04-01 NOTE — Progress Notes (Signed)
CPT not done due to pt sleeping. °

## 2020-04-01 NOTE — Progress Notes (Signed)
Patient ID: Veronica Horton, female   DOB: 09-18-1960, 59 y.o.   MRN: [id number]  PROGRESS NOTE    Veronica Horton  UQJ:[id number] DOB: Aug 28, 1960 DOA: 03/10/2020 PCP: [facility]   Brief Narrative:  59 year old female with history of morbid obesity, hiatal hernia, hypertension, COPD, chronic respiratory failure on home oxygen at 5 L/min, smoker presented with worsening shortness of breath and altered mental status.  On presentation, ABG showed PCO2 of 92 requiring BiPAP. She was found to have laryngeal mass concerning for malignancy and she underwent biopsy which showed invasive squamous cell carcinoma and she required tracheostomy. She had to be transferred to ICU post biopsy and had to be kept on ventilator support. She was transferred to [facility] long on 11/17 for radiation therapy. She was transferred to [facility] service from [name] service on 03/20/2020. UnderwentPEG placementby IRon 03/22/2020. She started concurrent chemo radiationon 11/29.  Currently working on discharge planning.  Therapy recommending home health with 24 hr supervision/assistance.  CM currently working on [facility], some difficulty with finding accepting agency.   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 Found to have airway obstruction/compromise due to laryngeal mass. CT soft tissue neck on 11/11 showed bulky bilateral supraglottic tumor with possible airway compromise, involvement of right laryngeal cartilages. ENT was consulted. She underwent laryngeal biopsy which showed squamous cell carcinoma.  -Status post tracheostomy -UnderwentPEG placement by IR on 11/22. -CT chest/abdomen/pelvis did not show signs of metastatic disease. -She was recommended total laryngectomy with bilateral neck dissection but she declined surgery.  -Oncology/radiation oncologyfollowing.  Per oncology, started chemoradiation  11/29. -Diet as per SLP recommendations.  Continue tube feeding as per dietary -palliative care for goals of care discussion is pending.  Acute on chronic hypoxic/hypercarbic respiratory failure: History of COPD, on oxygen at home at 5 L/min. Chronic respiratory failure exacerbated by laryngeal squamous cell carcinoma, COPD exacerbation, pneumonia.  -Hospital course was remarkable for Serratia pneumonia. Finished the course of antibiotics with levofloxacin.  -Currently on trach collar, currently requiring 10 L by trach collar on 40% FiO2, wean as tolerated.  PCCM following. -Repeat CXR 11/25 with persistent subsegmental atelectasis at R base with improving atelectasis vs consolidation and tiny effusion at L base -Repeat CXR 11/29 with interval small bilateral effusions -> received lasix 11/29 and 11/30, follow   COPD exacerbation:  -Continue pulmonary hygiene, ventilator support at night, trach collar during the day. Follow intermittent chest x-ray. Aspiration precautions.Steroids have been tapered off. No wheezing appreciated. -Continue nebs  Hypoglycemia: -Continue tube feeds and increase oral intake as tolerated  GERD: Continue PPI  Hypertension:Monitor blood pressure. Off antihypertensives which she was at home. Blood pressure okay.  AKI: Resolved  Thrombocytopenia: resolved.  Disposition:therapy now recommending HH and 24 hr supervision/assistance, CM working on home health, but apparently no accepting agency.  Will need to ensure she's able to manage trach and o2 needs at home as well.     DVT prophylaxis:lovenox Code Status: Full Family Communication:  None at bedside Disposition Plan: Status is: Inpatient  Remains inpatient appropriate because:Inpatient level of care appropriate due to severity of illness   Dispo: The patient is from: Home              Anticipated d/c is to: Home              Anticipated d/c date is: > 3 days  Patient  currently is not medically stable to d/c.   Consultants: PCCM/ENT/IR/oncology/radiation oncology  Procedures:  Intubation/extubation Tracheostomy PEG tube placement  Echo IMPRESSIONS    1. Left ventricular ejection fraction, by estimation, is 70 to 75%. The  left ventricle has hyperdynamic function. The left ventricle has no  regional wall motion abnormalities. There is mild left ventricular  hypertrophy. Left ventricular diastolic  parameters were normal.  2. Right ventricular systolic function is normal. The right ventricular  size is normal. Tricuspid regurgitation signal is inadequate for assessing  PA pressure.  3. The mitral valve is grossly normal. Trivial mitral valve  regurgitation.  4. The aortic valve is tricuspid. Aortic valve regurgitation is not  visualized.  5. The inferior vena cava is normal in size with greater than 50%  respiratory variability, suggesting right atrial pressure of 3 mmHg.   Antimicrobials:  Anti-infectives (From admission, onward)   Start     Dose/Rate Route Frequency Ordered Stop   03/22/20 1700  vancomycin (VANCOCIN) IVPB 1000 mg/200 mL premix  Status:  Discontinued        1,000 mg 200 mL/hr over 60 Minutes Intravenous  Once 03/22/20 1605 03/23/20 0236   03/22/20 1604  vancomycin (VANCOCIN) 1-5 GM/200ML-% IVPB       Note to Pharmacy: Veronica Horton   : cabinet override      03/22/20 1604 03/22/20 1615   03/14/20 1100  levofloxacin (LEVAQUIN) IVPB 750 mg        750 mg 100 mL/hr over 90 Minutes Intravenous Daily 03/14/20 0955 03/19/20 1146   03/10/20 1730  doxycycline (VIBRAMYCIN) 100 mg in sodium chloride 0.9 % 250 mL IVPB  Status:  Discontinued        100 mg 125 mL/hr over 120 Minutes Intravenous Every 12 hours 03/10/20 1634 03/14/20 0955   03/10/20 1600  azithromycin (ZITHROMAX) 500 mg in sodium chloride 0.9 % 250 mL IVPB  Status:  Discontinued        500 mg 250 mL/hr over 60 Minutes Intravenous Every 24 hours 03/10/20 1529  03/10/20 1538   03/10/20 1500  clindamycin (CLEOCIN) IVPB 600 mg  Status:  Discontinued        600 mg 100 mL/hr over 30 Minutes Intravenous  Once 03/10/20 1458 03/10/20 1521       Subjective: Patient seen and examined at bedside.  Poor historian.  Sleepy, wakes up slightly, nods her head to some questions.  No worsening shortness of breath, fever or vomiting reported.  Objective: Vitals:   03/31/20 2008 03/31/20 2233 04/01/20 0334 04/01/20 0421  BP: (!) 102/54   (!) 97/52  Pulse: 76 68 68 62  Resp: 18   18  Temp: 98.9 F (37.2 C)   98.1 F (36.7 C)  TempSrc: Oral   Oral  SpO2: 90% (!) 89% 91% 94%  Weight:      Height:        Intake/Output Summary (Last 24 hours) at 04/01/2020 0801 Last data filed at 04/01/2020 0000 Gross per 24 hour  Intake 840 ml  Output 900 ml  Net -60 ml   Filed Weights   03/28/20 0625 03/29/20 0500 03/30/20 0500  Weight: 113.4 kg 113.8 kg 94.6 kg    Examination:  General exam: No acute distress.  Looks chronically ill.  Currently on 10 L oxygen via tracheostomy ENT: Tracheostomy present. respiratory system: Decreased breath sounds at bases with some crackles, no wheezing  cardiovascular system: Currently rate controlled, S1-S2 heard gastrointestinal system: Abdomen is obese,  nondistended, soft and nontender.  Bowel sounds are heard.  PEG tube present Extremities: Trace lower extremity edema present.  No clubbing  Central nervous system: Sleepy, wakes up slightly, nods her head to some questions.  No focal neurological deficits.  Moves extremities.  Poor historian. Skin: No obvious petechiae/rashes  psychiatry: Affect is flat    Data Reviewed: I have personally reviewed following labs and imaging studies  CBC: Recent Labs  Lab 03/27/20 0616 03/28/20 0646 03/29/20 0515 03/30/20 0819 03/31/20 0611  WBC 8.5 7.9 7.0 9.8 8.6  NEUTROABS 6.4 5.7 4.8 9.1* 6.8  HGB 13.0 12.3 12.1 13.3 12.5  HCT 40.0 38.0 37.7 41.1 39.2  MCV 98.3 98.7 99.0  97.9 99.0  PLT 158 152 149* 169 619   Basic Metabolic Panel: Recent Labs  Lab 03/27/20 0616 03/28/20 0646 03/29/20 0515 03/30/20 0819 03/31/20 0611  NA 140 139 138 138 135  K 4.4 4.1 4.4 4.9 4.8  CL 106 105 103 103 99  CO2 26 27 27 27 29   GLUCOSE 137* 134* 130* 124* 94  BUN 17 17 19  26* 34*  CREATININE 0.88 0.95 0.97 0.90 1.19*  CALCIUM 8.5* 8.4* 8.4* 8.9 8.6*  MG 2.4 2.3 2.3 2.7* 2.4  PHOS 3.5 4.4 4.4 4.1 5.4*   GFR: Estimated Creatinine Clearance: 57.1 mL/min (A) (by C-G formula based on SCr of 1.19 mg/dL (H)). Liver Function Tests: Recent Labs  Lab 03/27/20 0616 03/28/20 0646 03/29/20 0515 03/30/20 0819 03/31/20 0611  AST 14* 13* 14* 17 19  ALT 15 16 15 16 22   ALKPHOS 55 52 53 56 52  BILITOT 0.5 0.6 0.7 0.8 0.5  PROT 5.5* 5.4* 5.6* 6.4* 5.8*  ALBUMIN 2.7* 2.6* 2.7* 2.9* 2.9*   No results for input(s): LIPASE, AMYLASE in the last 168 hours. No results for input(s): AMMONIA in the last 168 hours. Coagulation Profile: No results for input(s): INR, PROTIME in the last 168 hours. Cardiac Enzymes: No results for input(s): CKTOTAL, CKMB, CKMBINDEX, TROPONINI in the last 168 hours. BNP (last 3 results) No results for input(s): PROBNP in the last 8760 hours. HbA1C: No results for input(s): HGBA1C in the last 72 hours. CBG: Recent Labs  Lab 03/31/20 1543 03/31/20 2007 03/31/20 2347 04/01/20 0419 04/01/20 0731  GLUCAP 100* 104* 106* 78 80   Lipid Profile: No results for input(s): CHOL, HDL, LDLCALC, TRIG, CHOLHDL, LDLDIRECT in the last 72 hours. Thyroid Function Tests: No results for input(s): TSH, T4TOTAL, FREET4, T3FREE, THYROIDAB in the last 72 hours. Anemia Panel: No results for input(s): VITAMINB12, FOLATE, FERRITIN, TIBC, IRON, RETICCTPCT in the last 72 hours. Sepsis Labs: No results for input(s): PROCALCITON, LATICACIDVEN in the last 168 hours.  No results found for this or any previous visit (from the past 240 hour(s)).       Radiology  Studies: DG CHEST PORT 1 VIEW  Result Date: 03/31/2020 CLINICAL DATA:  Pleural effusion EXAM: PORTABLE CHEST 1 VIEW COMPARISON:  03/29/2020 FINDINGS: Tracheostomy unchanged. Lung volumes are small, however, pulmonary insufflation remains stable since prior examination. Bilateral pleural effusions are present small and decreased in size since prior examination. Ovoid opacity within the right mid lung zone likely represents fluid within the fissure. There is bibasilar atelectasis. Cardiac size is within normal limits. No acute bone abnormality. IMPRESSION: Slight interval decrease in small bilateral pleural effusions with associated bibasilar atelectasis. Pulmonary hypoinflation. Electronically Signed   By: Fidela Salisbury MD   On: 03/31/2020 05:41        Scheduled Meds:  arformoterol  15 mcg Nebulization BID   budesonide (PULMICORT) nebulizer solution  0.5 mg Nebulization BID   chlorhexidine  15 mL Mouth Rinse BID   Chlorhexidine Gluconate Cloth  6 each Topical Daily   docusate  100 mg Per Tube BID   enoxaparin (LOVENOX) injection  55 mg Subcutaneous Q24H   feeding supplement  237 mL Oral TID BM   gabapentin  600 mg Oral QHS   insulin aspart  0-9 Units Subcutaneous Q4H   ipratropium-albuterol  3 mL Nebulization TID   ketotifen  1 drop Both Eyes BID   lidocaine  1 patch Transdermal Q24H   mouth rinse  15 mL Mouth Rinse q12n4p   melatonin  5 mg Oral QHS   multivitamin with minerals  1 tablet Per Tube Daily   nicotine  21 mg Transdermal Daily   pantoprazole sodium  40 mg Per Tube Daily   scopolamine  1 patch Transdermal Q72H   Continuous Infusions:  sodium chloride            Aline August, MD [facility] 04/01/2020, 8:01 AM

## 2020-04-01 NOTE — Progress Notes (Signed)
Trach changed by CCM/NP. Pt had good color change on CO2 detector, equal bs and normal vitals.

## 2020-04-01 NOTE — Progress Notes (Signed)
SLP Cancellation Note  Patient Details Name: Veronica Horton MRN: [id number] DOB: December 24, 1960   Cancelled treatment:       Reason Eval/Treat Not Completed: Other (comment) (pt was asleep when SLP came by earlier at approx 1245 pm, will continue efforts)  [name], MS [facility] SLP [place] Office [phone] Pager [phone]    [name] 04/01/2020, 2:38 PM

## 2020-04-01 NOTE — Progress Notes (Signed)
PT Cancellation Note  Patient Details Name: Veronica Horton MRN: [id number] DOB: 07/25/1960   Cancelled Treatment:    Reason Eval/Treat Not Completed: Patient at procedure or test/unavailable Pt with tracheostomy exchange earlier and now at radiation.  Will check back as schedule permits.   Kathaleen Dudziak,KATHrine E 04/01/2020, 2:26 PM Kati PT, DPT [facility] Pager: [phone] Office: [phone]

## 2020-04-02 ENCOUNTER — Ambulatory Visit
Admit: 2020-04-02 | Discharge: 2020-04-02 | Disposition: A | Discharge status: Home / Self Care | Payer: Medicaid Other | Attending: Radiation Oncology | Admitting: Radiation Oncology

## 2020-04-02 LAB — CBC WITH DIFFERENTIAL/PLATELET
Abs Immature Granulocytes: 0.02 10*3/uL (ref 0.00–0.07)
Basophils Absolute: 0 10*3/uL (ref 0.0–0.1)
Basophils Relative: 0 %
Eosinophils Absolute: 0.1 10*3/uL (ref 0.0–0.5)
Eosinophils Relative: 2 %
HCT: 37.6 % (ref 36.0–46.0)
Hemoglobin: 12 g/dL (ref 12.0–15.0)
Immature Granulocytes: 0 %
Lymphocytes Relative: 12 %
Lymphs Abs: 0.7 10*3/uL (ref 0.7–4.0)
MCH: 31.7 pg (ref 26.0–34.0)
MCHC: 31.9 g/dL (ref 30.0–36.0)
MCV: 99.5 fL (ref 80.0–100.0)
Monocytes Absolute: 0.5 10*3/uL (ref 0.1–1.0)
Monocytes Relative: 10 %
Neutro Abs: 4.2 10*3/uL (ref 1.7–7.7)
Neutrophils Relative %: 76 %
Platelets: 149 10*3/uL — ABNORMAL LOW (ref 150–400)
RBC: 3.78 MIL/uL — ABNORMAL LOW (ref 3.87–5.11)
RDW: 14.6 % (ref 11.5–15.5)
WBC: 5.5 10*3/uL (ref 4.0–10.5)
nRBC: 0 % (ref 0.0–0.2)

## 2020-04-02 LAB — GLUCOSE, CAPILLARY
Glucose-Capillary: 100 mg/dL — ABNORMAL HIGH (ref 70–99)
Glucose-Capillary: 100 mg/dL — ABNORMAL HIGH (ref 70–99)
Glucose-Capillary: 103 mg/dL — ABNORMAL HIGH (ref 70–99)
Glucose-Capillary: 120 mg/dL — ABNORMAL HIGH (ref 70–99)
Glucose-Capillary: 91 mg/dL (ref 70–99)
Glucose-Capillary: 93 mg/dL (ref 70–99)

## 2020-04-02 LAB — COMPREHENSIVE METABOLIC PANEL
ALT: 18 U/L (ref 0–44)
AST: 15 U/L (ref 15–41)
Albumin: 3 g/dL — ABNORMAL LOW (ref 3.5–5.0)
Alkaline Phosphatase: 61 U/L (ref 38–126)
Anion gap: 8 (ref 5–15)
BUN: 20 mg/dL (ref 6–20)
CO2: 28 mmol/L (ref 22–32)
Calcium: 8.5 mg/dL — ABNORMAL LOW (ref 8.9–10.3)
Chloride: 99 mmol/L (ref 98–111)
Creatinine, Ser: 0.99 mg/dL (ref 0.44–1.00)
GFR, Estimated: >60 mL/min (ref >60)
Glucose, Bld: 101 mg/dL — ABNORMAL HIGH (ref 70–99)
Potassium: 4.1 mmol/L (ref 3.5–5.1)
Sodium: 135 mmol/L (ref 135–145)
Total Bilirubin: 0.6 mg/dL (ref 0.3–1.2)
Total Protein: 5.9 g/dL — ABNORMAL LOW (ref 6.5–8.1)

## 2020-04-02 MED ORDER — POLYETHYLENE GLYCOL 3350 17 G PO PACK: 17.0000 g | PACK | Freq: Every day | ORAL | Status: DC | PRN

## 2020-04-02 MED ORDER — ENOXAPARIN SODIUM 60 MG/0.6ML ~~LOC~~ SOLN
60.0000 mg | Freq: Every day | SUBCUTANEOUS | Status: DC
  Administered 2020-04-02 – 2020-04-14 (×13): 60 mg via SUBCUTANEOUS
  Filled 2020-04-02 (×12): qty 0.6

## 2020-04-02 MED ORDER — BISACODYL 10 MG RE SUPP
10.0000 mg | Freq: Every day | RECTAL | Status: DC | PRN
  Administered 2020-04-02 – 2020-04-06 (×2): 10 mg via RECTAL
  Filled 2020-04-02 (×4): qty 1

## 2020-04-02 MED ORDER — OSMOLITE 1.5 CAL PO LIQD
237.0000 mL | Freq: Three times a day (TID) | ORAL | Status: DC
  Administered 2020-04-02 – 2020-04-16 (×38): 237 mL
  Filled 2020-04-02 (×45): qty 237

## 2020-04-02 MED ORDER — GUAIFENESIN-DM 100-10 MG/5ML PO SYRP
10.0000 mL | ORAL_SOLUTION | ORAL | Status: DC | PRN
  Administered 2020-04-02 – 2020-04-03 (×2): 10 mL
  Filled 2020-04-02 (×3): qty 10

## 2020-04-02 NOTE — Progress Notes (Signed)
Patient ID: Veronica Horton, female   DOB: April 27, 1961, 59 y.o.   MRN: [id number]  PROGRESS NOTE    Veronica Horton  TSV:[id number] DOB: 20-Dec-1960 DOA: 03/10/2020 PCP: [facility]   Brief Narrative:  59 year old female with history of morbid obesity, hiatal hernia, hypertension, COPD, chronic respiratory failure on home oxygen at 5 L/min, smoker presented with worsening shortness of breath and altered mental status.  On presentation, ABG showed PCO2 of 92 requiring BiPAP. She was found to have laryngeal mass concerning for malignancy and she underwent biopsy which showed invasive squamous cell carcinoma and she required tracheostomy. She had to be transferred to ICU post biopsy and had to be kept on ventilator support. She was transferred to [place] long on 11/17 for radiation therapy. She was transferred to [facility] service from [facility] service on 03/20/2020. UnderwentPEG placementby IRon 03/22/2020. She started concurrent chemo radiationon 11/29.  Currently working on discharge planning.  Therapy recommending home health with 24 hr supervision/assistance.  CM currently working on [facility], some difficulty with finding accepting agency.   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 Found to have airway obstruction/compromise due to laryngeal mass. CT soft tissue neck on 11/11 showed bulky bilateral supraglottic tumor with possible airway compromise, involvement of right laryngeal cartilages. ENT was consulted. She underwent laryngeal biopsy which showed squamous cell carcinoma.  -Status post tracheostomy -UnderwentPEG placement by IR on 11/22. -CT chest/abdomen/pelvis did not show signs of metastatic disease. -She was recommended total laryngectomy with bilateral neck dissection but she declined surgery.  -Oncology/radiation oncologyfollowing.  Per oncology, started chemoradiation  11/29. -Diet as per SLP recommendations.  Continue tube feeding as per dietary -palliative care for goals of care discussion is pending.  Acute on chronic hypoxic/hypercarbic respiratory failure: History of COPD, on oxygen at home at 5 L/min. Chronic respiratory failure exacerbated by laryngeal squamous cell carcinoma, COPD exacerbation, pneumonia.  -Hospital course was remarkable for Serratia pneumonia. Finished the course of antibiotics with levofloxacin.  -PCCM following: Size 6 cuffed Shiley removed and size 6 uncuffed Shiley placed by PCCM on 04/01/2020.  Currently still requiring 10 L oxygen via trach -Repeat CXR 11/25 with persistent subsegmental atelectasis at R base with improving atelectasis vs consolidation and tiny effusion at L base -Repeat CXR 11/29 with interval small bilateral effusions -> received lasix 11/29 and 11/30, follow   COPD exacerbation:  -Continue pulmonary hygiene, ventilator support at night, trach collar during the day. Follow intermittent chest x-ray. Aspiration precautions.Steroids have been tapered off. No wheezing appreciated. -Continue nebs  Hypoglycemia: -Continue tube feeds and increase oral intake as tolerated  GERD: Continue PPI  Hypertension:Monitor blood pressure. Off antihypertensives which she was at home. Blood pressure okay.  AKI: Resolved  Thrombocytopenia: resolved.  Disposition:therapy now recommending HH and 24 hr supervision/assistance, CM working on home health, but apparently no accepting agency.  Will need to ensure she's able to manage trach and o2 needs at home as well.     DVT prophylaxis:lovenox Code Status: Full Family Communication:  None at bedside Disposition Plan: Status is: Inpatient  Remains inpatient appropriate because:Inpatient level of care appropriate due to severity of illness   Dispo: The patient is from: Home              Anticipated d/c is to: Home              Anticipated d/c date  is: > 3 days  Patient currently is medically stable to d/c.  If appropriate home health agency can be found   Consultants: PCCM/ENT/IR/oncology/radiation oncology  Procedures:  Intubation/extubation Tracheostomy PEG tube placement  Echo IMPRESSIONS    1. Left ventricular ejection fraction, by estimation, is 70 to 75%. The  left ventricle has hyperdynamic function. The left ventricle has no  regional wall motion abnormalities. There is mild left ventricular  hypertrophy. Left ventricular diastolic  parameters were normal.  2. Right ventricular systolic function is normal. The right ventricular  size is normal. Tricuspid regurgitation signal is inadequate for assessing  PA pressure.  3. The mitral valve is grossly normal. Trivial mitral valve  regurgitation.  4. The aortic valve is tricuspid. Aortic valve regurgitation is not  visualized.  5. The inferior vena cava is normal in size with greater than 50%  respiratory variability, suggesting right atrial pressure of 3 mmHg.   Antimicrobials:  Anti-infectives (From admission, onward)   Start     Dose/Rate Route Frequency Ordered Stop   03/22/20 1700  vancomycin (VANCOCIN) IVPB 1000 mg/200 mL premix  Status:  Discontinued        1,000 mg 200 mL/hr over 60 Minutes Intravenous  Once 03/22/20 1605 03/23/20 0236   03/22/20 1604  vancomycin (VANCOCIN) 1-5 GM/200ML-% IVPB       Note to Pharmacy: [name]   : cabinet override      03/22/20 1604 03/22/20 1615   03/14/20 1100  levofloxacin (LEVAQUIN) IVPB 750 mg        750 mg 100 mL/hr over 90 Minutes Intravenous Daily 03/14/20 0955 03/19/20 1146   03/10/20 1730  doxycycline (VIBRAMYCIN) 100 mg in sodium chloride 0.9 % 250 mL IVPB  Status:  Discontinued        100 mg 125 mL/hr over 120 Minutes Intravenous Every 12 hours 03/10/20 1634 03/14/20 0955   03/10/20 1600  azithromycin (ZITHROMAX) 500 mg in sodium chloride 0.9 % 250 mL IVPB  Status:  Discontinued         500 mg 250 mL/hr over 60 Minutes Intravenous Every 24 hours 03/10/20 1529 03/10/20 1538   03/10/20 1500  clindamycin (CLEOCIN) IVPB 600 mg  Status:  Discontinued        600 mg 100 mL/hr over 30 Minutes Intravenous  Once 03/10/20 1458 03/10/20 1521       Subjective: Patient seen and examined at bedside.  Very poor historian.  Awake, nods her head to some questions.  No overnight fever or vomiting reported. Objective: Vitals:   04/01/20 2012 04/01/20 2319 04/02/20 0330 04/02/20 0433  BP: (!) 105/53   (!) 99/56  Pulse: 71 68 (!) 58 60  Resp: 18 16 16 20   Temp: 98.6 F (37 C)   98.7 F (37.1 C)  TempSrc: Oral   Oral  SpO2: 94% 92% 91% 93%  Weight:      Height:        Intake/Output Summary (Last 24 hours) at 04/02/2020 0800 Last data filed at 04/02/2020 0000 Gross per 24 hour  Intake 120 ml  Output 1000 ml  Net -880 ml   Filed Weights   03/29/20 0500 03/30/20 0500 04/01/20 1700  Weight: 113.8 kg 94.6 kg 115.8 kg    Examination:  General exam: No distress.  Looks chronically ill.  Still on 10 L oxygen via tracheostomy  ENT: Trach present. respiratory system: Bilateral decreased responses bases with scattered crackles cardiovascular system: Rate controlled, S1-S2 heard gastrointestinal system: Abdomen is obese, nondistended, soft and nontender.  Normal bowel sounds are heard.  PEG tube present Extremities: No cyanosis.  Mild lower extremity edema present. Central nervous system: Poor historian, awake, nods her head to some questions.  No focal neurological deficits.  Moving extremities Skin: No obvious ecchymosis/lesions psychiatry: Flat affect   Data Reviewed: I have personally reviewed following labs and imaging studies  CBC: Recent Labs  Lab 03/27/20 0616 03/28/20 0646 03/29/20 0515 03/30/20 0819 03/31/20 0611  WBC 8.5 7.9 7.0 9.8 8.6  NEUTROABS 6.4 5.7 4.8 9.1* 6.8  HGB 13.0 12.3 12.1 13.3 12.5  HCT 40.0 38.0 37.7 41.1 39.2  MCV 98.3 98.7 99.0 97.9 99.0   PLT 158 152 149* 169 585   Basic Metabolic Panel: Recent Labs  Lab 03/27/20 0616 03/28/20 0646 03/29/20 0515 03/30/20 0819 03/31/20 0611  NA 140 139 138 138 135  K 4.4 4.1 4.4 4.9 4.8  CL 106 105 103 103 99  CO2 26 27 27 27 29   GLUCOSE 137* 134* 130* 124* 94  BUN 17 17 19  26* 34*  CREATININE 0.88 0.95 0.97 0.90 1.19*  CALCIUM 8.5* 8.4* 8.4* 8.9 8.6*  MG 2.4 2.3 2.3 2.7* 2.4  PHOS 3.5 4.4 4.4 4.1 5.4*   GFR: Estimated Creatinine Clearance: 63.9 mL/min (A) (by C-G formula based on SCr of 1.19 mg/dL (H)). Liver Function Tests: Recent Labs  Lab 03/27/20 0616 03/28/20 0646 03/29/20 0515 03/30/20 0819 03/31/20 0611  AST 14* 13* 14* 17 19  ALT 15 16 15 16 22   ALKPHOS 55 52 53 56 52  BILITOT 0.5 0.6 0.7 0.8 0.5  PROT 5.5* 5.4* 5.6* 6.4* 5.8*  ALBUMIN 2.7* 2.6* 2.7* 2.9* 2.9*   No results for input(s): LIPASE, AMYLASE in the last 168 hours. No results for input(s): AMMONIA in the last 168 hours. Coagulation Profile: No results for input(s): INR, PROTIME in the last 168 hours. Cardiac Enzymes: No results for input(s): CKTOTAL, CKMB, CKMBINDEX, TROPONINI in the last 168 hours. BNP (last 3 results) No results for input(s): PROBNP in the last 8760 hours. HbA1C: No results for input(s): HGBA1C in the last 72 hours. CBG: Recent Labs  Lab 04/01/20 1607 04/01/20 2010 04/01/20 2357 04/02/20 0430 04/02/20 0748  GLUCAP 113* 118* 112* 100* 103*   Lipid Profile: No results for input(s): CHOL, HDL, LDLCALC, TRIG, CHOLHDL, LDLDIRECT in the last 72 hours. Thyroid Function Tests: No results for input(s): TSH, T4TOTAL, FREET4, T3FREE, THYROIDAB in the last 72 hours. Anemia Panel: No results for input(s): VITAMINB12, FOLATE, FERRITIN, TIBC, IRON, RETICCTPCT in the last 72 hours. Sepsis Labs: No results for input(s): PROCALCITON, LATICACIDVEN in the last 168 hours.  No results found for this or any previous visit (from the past 240 hour(s)).       Radiology Studies: No  results found.      Scheduled Meds: . arformoterol  15 mcg Nebulization BID  . budesonide (PULMICORT) nebulizer solution  0.5 mg Nebulization BID  . chlorhexidine  15 mL Mouth Rinse BID  . Chlorhexidine Gluconate Cloth  6 each Topical Daily  . docusate  100 mg Per Tube BID  . enoxaparin (LOVENOX) injection  55 mg Subcutaneous Q24H  . feeding supplement  237 mL Oral TID BM  . gabapentin  600 mg Oral QHS  . insulin aspart  0-9 Units Subcutaneous Q4H  . ipratropium-albuterol  3 mL Nebulization TID  . ketotifen  1 drop Both Eyes BID  . lidocaine  1 patch Transdermal Q24H  . mouth rinse  15 mL Mouth Rinse q12n4p  .  melatonin  5 mg Oral QHS  . multivitamin with minerals  1 tablet Per Tube Daily  . nicotine  21 mg Transdermal Daily  . pantoprazole sodium  40 mg Per Tube Daily  . scopolamine  1 patch Transdermal Q72H   Continuous Infusions: . sodium chloride            Aline August, MD [facility] 04/02/2020, 8:00 AM

## 2020-04-02 NOTE — Progress Notes (Signed)
Physical Therapy Treatment Patient Details Name: Veronica Horton MRN: [id number] DOB: [birth year] Today's Date: 04/02/2020    History of Present Illness Ms. Klumb is a 59 year old female with medical history significant for COPD with chronic hypoxic respiratory failure on 5 L, HTN, tobacco use, obesity class 3 who presented on 11/10 with worsening shortness of breath over the past 4 days and increased confusion. Pt found to be in COPD exacerbation, hospital course at [facility] complicated by dysphasia, pt found to have a laryngeal mass. Pt transferred to [facility]. Pt underwent trach placement on 11/13 s/p layrngeal mass biopsy, went on vent 11/14 in AM due to desaturation..11/22 G-tube    PT Comments    Pt assisted with ambulating in hallway and progressing very well with mobility.   Follow Up Recommendations  Home health PT;Supervision/Assistance - 24 hour     Equipment Recommendations  None recommended by PT    Recommendations for Other Services       Precautions / Restrictions Precautions Precautions: Fall Precaution Comments: trach collar, G-tube    Mobility  Bed Mobility Overal bed mobility: Modified Independent                Transfers Overall transfer level: Needs assistance Equipment used: Rolling walker (2 wheeled) Transfers: Sit to/from Stand Sit to Stand: Supervision         General transfer comment: supervision for safety/ lines  Ambulation/Gait Ambulation/Gait assistance: Supervision;Min guard Gait Distance (Feet): 220 Feet Assistive device: Rolling walker (2 wheeled) Gait Pattern/deviations: Step-through pattern;Trunk flexed     General Gait Details: cues for RW positioning, ambulated on 8L 40% trach collar   Stairs             Wheelchair Mobility    Modified Rankin (Stroke Patients Only)       Balance           Standing balance support: During functional activity Standing balance-Leahy Scale: Fair                               Cognition Arousal/Alertness: Awake/alert Behavior During Therapy: WFL for tasks assessed/performed Overall Cognitive Status: Within Functional Limits for tasks assessed                                        Exercises      General Comments        Pertinent Vitals/Pain Pain Assessment: No/denies pain    Home Living                      Prior Function            PT Goals (current goals can now be found in the care plan section) Acute Rehab PT Goals PT Goal Formulation: With patient Time For Goal Achievement: 04/16/20 Potential to Achieve Goals: Good Progress towards PT goals: Progressing toward goals    Frequency    Min 3X/week      PT Plan Current plan remains appropriate    Co-evaluation              AM-PAC PT "6 Clicks" Mobility   Outcome Measure  Help needed turning from your back to your side while in a flat bed without using bedrails?: None Help needed moving from lying on your back to sitting on the side of a flat  bed without using bedrails?: None Help needed moving to and from a bed to a chair (including a wheelchair)?: A Little Help needed standing up from a chair using your arms (e.g., wheelchair or bedside chair)?: A Little Help needed to walk in hospital room?: A Little Help needed climbing 3-5 steps with a railing? : A Little 6 Click Score: 20    End of Session Equipment Utilized During Treatment: Oxygen Activity Tolerance: Patient tolerated treatment well Patient left: in chair;with call bell/phone within reach Nurse Communication: Mobility status (RN aware chair alarm not on (pt sitting on 5 pillows)) PT Visit Diagnosis: Muscle weakness (generalized) (M62.81);Difficulty in walking, not elsewhere classified (R26.2)     Time: 2633-3545 PT Time Calculation (min) (ACUTE ONLY): 24 min  Charges:  $Gait Training: 8-22 mins                     Arlyce Dice, DPT Acute Rehabilitation Services Pager:  [phone] Office: [phone] [place] E 04/02/2020, 1:01 PM

## 2020-04-02 NOTE — Progress Notes (Signed)
NAME:  Veronica Horton, MRN:  [id number], DOB:  03-01-1961, LOS: 23 ADMISSION DATE:  03/10/2020, CONSULTATION DATE:  04/02/20 REFERRING MD:  Hospitalist, CHIEF COMPLAINT:  Respiratory failure   Brief History   59 y.o. F with PMH of COPD 11/10 with shortness of breath found to have laryngeal mass concerning for malignancy s/p biopsy and tracheostomy.  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  She was transferred to the intensive care unit post biopsy as she required ventilator support.  Tx to [facility] on 11/17 for ONC therapy.   Past Medical History  COPD, Bronchitis,Class III Obesity ,DJD,[facility] [place] [facility]   11/10 admit to hospitalists with SOB 11/13 laryngoscopy and biopsy with tracheostomy 11/15 transferred to intensive care unit 11/15 transition to trach collar 11/17 Tx to [facility] for ONC therapy. 24 hours on ATC.  11/22 G Tube with IR  11/23 starting EN   Consults:  ENT Oncology PCCM  Procedures:  ENT Trach 11/13 > IR GTube 11/22>   Significant Diagnostic Tests:  11/11 CT soft tissue neck with contrast >> Bulky bilateral supraglottic tumor with possible airway Compromise, Involvement of right laryngeal cartilages, with extension in the midline anterior to the strap muscles, and also extension through the right paraglottic space and/or inseparable malignant right level IIIa lymph node. 11/15 CT chest/abdomen/pelvis >> no signs of metastatic disease, rounded partially necrotic lymph node to the left neck, basilar consolidative changes similar to prior, small pneumomediastinum  Micro Data:  COVID 11/10 >> negative Influenza 11/10 >> negative  MRSA PCR 11/10 >>  Sputum 11/14 >> serratia >> R-cefazolin, S-ceftriaxone Trach aspirate 11/21> normal flora   Antimicrobials:  Azithro 11/10 x1  Clinda 11/10 x1  Doxycycline 11/10 >> 11/14  Levofloxacin 11/14 >> 11/19  Interim history/subjective:  No  distress Objective   Blood pressure (Abnormal) 99/56, pulse 62, temperature 98.7 F (37.1 C), temperature source Oral, resp. rate 18, height 5\' 5"  (1.651 m), weight 115.8 kg, SpO2 92 %.    FiO2 (%):  [40 %-60 %] 60 %   Intake/Output Summary (Last 24 hours) at 04/02/2020 1126 Last data filed at 04/02/2020 0800 Gross per 24 hour  Intake no documentation  Output 1950 ml  Net -1950 ml   Filed Weights   03/29/20 0500 03/30/20 0500 04/01/20 1700  Weight: 113.8 kg 94.6 kg 115.8 kg    Exam:  General this is a 59 year old female she is resting in chair. Not In distress HENT NCAT the size 6 cuffless trach is in good position. She is unable to phonate. Still has sig thick secretions.  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 No intubation from above due to airway occlusion. pcxr improved aeration bibasilar atx low volume  Plan Cont routine trach care Brovana and Yuperlri at dc No PMV for now. 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

## 2020-04-02 NOTE — Progress Notes (Signed)
  Speech Language Pathology Treatment: Dysphagia  Patient Details Name: Veronica Horton MRN: [id number] DOB: Feb 26, 1961 Today's Date: 04/02/2020 Time: 5993-5701 SLP Time Calculation (min) (ACUTE ONLY): 16 min  Assessment / Plan / Recommendation Clinical Impression  Pt reports she is consuming boluses of liquids after bites of food via mouthing "I do that".  Upon entrance to room, pt is demonstrating significant coughing = and although her cough is strong, she reports "choking on it".   When pt able to clear, her tracheal secretions are viscous and clear to white tinged, oral secretions are frothy and pt uses oral suction to remove.  Pt requested assistance and tracheal suctioning to clear - RN informed and she arrived to complete.  Following tracheal suctioning by RN, pt coughed an expectorated a large bolus of viscous secretions.  SLP observed pt consuming water with cough following swallow but no expectoration via trach. RN reports pt's secretions are not tinged the same color as her secretions fortunately.   However pt's oxygen needs increased from 40 to 60 and she has increased secretion production over the last 2 days with some dyspnea.  Thus SLP advises for pt to restart tube feeding at 1/2 dose to assure adequate nutrition consumed to decrease asp risk if pt is sleepy, dyspneic or tired, especially as she is progressing through treatment.  Encouraged pt to assure adequate usage of communication board for basic emergent needs and she placed this board on top of her notebook.  Pt assured SLP she will continue following solids with liquids and masaticating well.  Deferred exercises initiation due to pt report of "choking" on secretions.    HPI   Veronica Horton is a 59 y/o F with history of COPD,chronic respiratory failure on home oxygen at 5 L/min, with large laryngeal mass esion, now s/p tracheostomy with Shiley 6-0 and direct laryngoscopy and biopsy. Intraoperative frozen section confirmed squamous  cell carcinoma.  Pt is also s/p PEG for nutrition.  Plans are for chemo and radiation concurrently starting while in hospital.  Prior medical history includes smoking and some dyspnea if walking long distanced.  Pt underwent MBS today and follow up completed to educate her to recommendations.  She has been on a dys3/thin diet and follow up indicated to provide information and compensations for potential side effects of radiation and compensation strategies.  In addition assuring pt tolerating diet indicated.       SLP Plan  Continue with current plan of care       Recommendations  Diet recommendations: Regular;Thin liquid Liquids provided via: Straw;Cup Medication Administration: Via alternative means (or with puree) Supervision: Patient able to self feed Compensations: Slow rate;Small sips/bites;Follow solids with liquid Postural Changes and/or Swallow Maneuvers: Seated upright 90 degrees;Upright 30-60 min after meal                Oral Care Recommendations: Oral care BID Follow up Recommendations: Skilled Nursing facility SLP Visit Diagnosis: Dysphagia, pharyngoesophageal phase (R13.14) Plan: Continue with current plan of care       GO                Veronica Horton 04/02/2020, 4:17 PM  Veronica Lime, MS [facility] SLP Acute Rehab Services Office [phone] Pager [phone]

## 2020-04-02 NOTE — Progress Notes (Addendum)
HEMATOLOGY-ONCOLOGY PROGRESS NOTE  SUBJECTIVE:   She looks well, feels well. No pain Breathing stable.  She has been eating ok. No nausea or vomiting Bowels moving well. Urinating well. She was wondering if I can give her something to break the mucus,mucus is really thick No fevers or chills.  Rest of the pertinent 10 point ROS reviewed and negative  I have reviewed the past medical history, past surgical history, social history and family history with the patient and they are unchanged from previous note.   PHYSICAL EXAMINATION: ECOG PERFORMANCE STATUS: 2 - Symptomatic, <50% confined to bed  Vitals:   04/02/20 1534 04/02/20 1537  BP:    Pulse:  62  Resp:  18  Temp:    SpO2: 95% 95%   Filed Weights   03/29/20 0500 03/30/20 0500 04/01/20 1700  Weight: 250 lb 14.1 oz (113.8 kg) 208 lb 8.9 oz (94.6 kg) 255 lb 4.8 oz (115.8 kg)    Intake/Output from previous day: 12/02 0701 - 12/03 0700 In: 120 [P.O.:120] Out: 1000 [Urine:1000]  GENERAL: alert, no distress and comfortable, on trach collar Ant lung fields clear. Trach site with clear mucus Abdomen. Non tender non distended, PEG site clean. BLE venous stasis , no change.  LABORATORY DATA:  I have reviewed the data as listed CMP Latest Ref Rng & Units 04/02/2020 03/31/2020 03/30/2020  Glucose 70 - 99 mg/dL 101(H) 94 124(H)  BUN 6 - 20 mg/dL 20 34(H) 26(H)  Creatinine 0.44 - 1.00 mg/dL 0.99 1.19(H) 0.90  Sodium 135 - 145 mmol/L 135 135 138  Potassium 3.5 - 5.1 mmol/L 4.1 4.8 4.9  Chloride 98 - 111 mmol/L 99 99 103  CO2 22 - 32 mmol/L 28 29 27   Calcium 8.9 - 10.3 mg/dL 8.5(L) 8.6(L) 8.9  Total Protein 6.5 - 8.1 g/dL 5.9(L) 5.8(L) 6.4(L)  Total Bilirubin 0.3 - 1.2 mg/dL 0.6 0.5 0.8  Alkaline Phos 38 - 126 U/L 61 52 56  AST 15 - 41 U/L 15 19 17   ALT 0 - 44 U/L 18 22 16     Lab Results  Component Value Date   WBC 5.5 04/02/2020   HGB 12.0 04/02/2020   HCT 37.6 04/02/2020   MCV 99.5 04/02/2020   PLT 149 (L)  04/02/2020   NEUTROABS 4.2 04/02/2020    CT SOFT TISSUE NECK W CONTRAST  Addendum Date: 03/11/2020   ADDENDUM REPORT: 03/11/2020 16:36 ADDENDUM: Study discussed by telephone with Dr. Jolaine Artist MEMON on 03/11/2020 at 1631 hours. Electronically Signed   By: Genevie Ann M.D.   On: 03/11/2020 16:36   Result Date: 03/11/2020 CLINICAL DATA:  59 year old female with dysphagia. Tonsillitis suspected. EXAM: CT NECK WITH CONTRAST TECHNIQUE: Multidetector CT imaging of the neck was performed using the standard protocol following the bolus administration of intravenous contrast. CONTRAST:  33mL OMNIPAQUE IOHEXOL 300 MG/ML  SOLN COMPARISON:  Chest CT 01/28/2019. FINDINGS: Pharynx and larynx: Bulky soft tissue mass throughout the bilateral supraglottic larynx and affecting the hypopharynx. Lobulated diffuse thickening of the epiglottis (series 2, image 53). Diffuse false cord and anterior commissure involvement with evidence of extension into the right paraglottic space on series 2 image 61 - where nodular direct extension of tumor and/or inseparable abnormal right level 3 lymph node protrudes on coronal image 58. Asymmetric erosion of the undersurface of the right thyroid cartilage on image 65. Asymmetric sclerosis of the right arytenoid on image 67., and midline extension of tumor is suspected through the anterior commissure a distance of about 11  mm as seen on series 2, image 67 and sagittal image 48. All told, tumor size is estimated at 37 x 52 by 45 mm (AP by transverse by CC). There is evidence of airway compromise. The true cords may be spared as seen on series 2, image 71. Subglottic larynx is within normal limits. Above the vallecula pharyngeal contours appear normal. Normal superior parapharyngeal and retropharyngeal spaces. Salivary glands: Negative sublingual space. Submandibular glands and parotid glands remain within normal limits. Thyroid: Negative. Lymph nodes: Malignant left level IIIb lymph node measures 17  mm short axis and 29 mm long axis (series 2, image 55 and coronal image 73). As stated above it is possible of malignant right level IIIa lymph node is inseparable from the parent tumor along the right paraglottic space (coronal image 58). Smaller but asymmetrically enlarged left level 4 nodes measure up to 9 mm short axis (coronal image 72). No other abnormal or suspicious lymph nodes identified. Vascular: Major vascular structures in the neck and at the skull base remain patent including both internal jugular veins. The left vertebral artery appears dominant. Calcified atherosclerosis at the skull base. Limited intracranial: Negative. Visualized orbits: Negative. Mastoids and visualized paranasal sinuses: Clear. Skeleton: Absent and carious dentition. Cervical spine degeneration. No suspicious osseous lesion identified. 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 No superior mediastinal lymphadenopathy. Visible axillary lymph nodes are normal. Mild dependent atelectasis.  No upper lung nodule identified. IMPRESSION: 1. 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 Recommend ENT consultation. Involvement of right laryngeal cartilages, with extension in the midline anterior to the strap muscles, and also extension through the right paraglottic space and/or inseparable malignant right level IIIa lymph node. Estimated tumor long axis 5.2 cm. 2. Malignant contralateral left level 3b lymph node is 2.9 cm long axis. Smaller indeterminate left level 4 lymph nodes. 3. No distant metastatic disease identified in the neck or upper chest. Electronically Signed: By: Genevie Ann M.D. On: 03/11/2020 16:23   IR GASTROSTOMY TUBE MOD SED  Result Date: 03/22/2020 INDICATION: Head and neck malignancy, dysphagia EXAM: FLUOROSCOPIC 16 FRENCH BALLOON RETENTION GASTROSTOMY MEDICATIONS: 1 G VANCOMYCIN; Antibiotics were administered within 1 hour of the procedure. GLUCAGON 0.5 MG IV  ANESTHESIA/SEDATION: Versed 0 mg IV; Fentanyl 50 mcg IV Moderate Sedation Time:  12 MINUTES The patient was continuously monitored during the procedure by the interventional radiology nurse under my direct supervision. CONTRAST:  10 CC-administered into the gastric lumen. FLUOROSCOPY TIME:  Fluoroscopy Time: 1 minutes 36 seconds (53 mGy). COMPLICATIONS: None immediate. PROCEDURE: Informed written consent was obtained from the patient after a thorough discussion of the procedural risks, benefits and alternatives. All questions were addressed. Maximal Sterile Barrier Technique was utilized including caps, mask, sterile gowns, sterile gloves, sterile drape, hand hygiene and skin antiseptic. A timeout was performed prior to the initiation of the procedure. Previous imaging reviewed. Patient has an existing feeding tube within the stomach. This was utilized to insufflate the stomach with air. The stomach was localized beneath the left subcostal margin in the left abdomen with biplane fluoroscopy. Overlying skin marked. Under sterile conditions and local anesthesia, introducer needle was advanced into the stomach. Contrast injection confirms percutaneous needle access within the stomach. Single T tack deployed for gastropexy. Amplatz guidewire inserted. Tract dilatation performed to insert the 16 French balloon retention gastrostomy. Balloon tip inflated with 7 cc mixture of saline and 1 cc contrast. This was retracted against the anterior gastric wall. Contrast injection confirms position in the  stomach. Images obtained for documentation. T tacks secured to the skin site. A sterile dressing applied. No immediate complication. Patient tolerated the procedure well. IMPRESSION: Successful fluoroscopic 1 French balloon retention gastrostomy Electronically Signed   By: Jerilynn Mages.  Shick M.D.   On: 03/22/2020 16:53   CT CHEST ABDOMEN PELVIS W CONTRAST  Result Date: 03/15/2020 CLINICAL DATA:  New diagnosis of head neck mass EXAM:  CT CHEST, ABDOMEN, AND PELVIS WITH CONTRAST TECHNIQUE: Multidetector CT imaging of the chest, abdomen and pelvis was performed following the standard protocol during bolus administration of intravenous contrast. CONTRAST:  119mL OMNIPAQUE IOHEXOL 300 MG/ML  SOLN COMPARISON:  Prior swallowing evaluation and CT of the neck. FINDINGS: CT CHEST FINDINGS Cardiovascular: Calcified coronary artery disease. Aorta is normal caliber. 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 Central pulmonary vasculature is mildly engorged. Approximately 3 cm greatest caliber unchanged from previous exam. Unremarkable on limited venous phase assessment. Mediastinum/Nodes: Supraglottic mass partially imaged on the first image of the data set. Interval placement of tracheostomy tube with resultant pneumomediastinum in the anterior mediastinum in upper mediastinum. No adenopathy within the mediastinum or axilla. No hilar adenopathy. Rounded partial necrotic lymph node in the LEFT neck partially visualized, corresponding to level IIIb lymph node seen on the neck CT. Lungs/Pleura: Basilar consolidative changes bilaterally with similar pattern of consolidation though decreased volume loss when compared to the study of September of 2020. Airways are patent. Musculoskeletal: See below for full musculoskeletal detail. CT ABDOMEN PELVIS FINDINGS Hepatobiliary: Liver without focal lesion. Post cholecystectomy. No biliary duct dilation. Pancreas: Mild atrophy of the pancreas without focal lesion, ductal dilation or inflammation. Spleen: Spleen normal in size and contour. Adrenals/Urinary Tract: Adrenal glands are normal. Symmetric renal enhancement. No hydronephrosis. LEFT renal cysts, largest arising from the lower pole measuring 2.8 x 2.9 cm. Urinary bladder under distended limiting assessment. Stomach/Bowel: Gastrointestinal tract with signs of colonic diverticulosis. No acute bowel process. Rectus diastasis.  Postoperative changes in the midline of the abdomen related to prior ventral hernia repair. Mild bulging at the site of hernia repair, no herniation beyond the inserted mesh near the umbilicus. Vascular/Lymphatic: Calcified atheromatous plaque in the abdominal aorta. No aneurysmal dilation. There is no gastrohepatic or hepatoduodenal ligament lymphadenopathy. No retroperitoneal or mesenteric lymphadenopathy. No pelvic sidewall lymphadenopathy. Reproductive: No adnexal mass. Reproductive structures are unremarkable. Other: Post abdominal wall reconstruction with rectus diastasis, no frank hernia. Musculoskeletal: Spinal degenerative changes. Degenerative changes in glenohumeral joints and hips. Ununited fractures along posterior LEFT chest involving ribs 10 through 12. These appear subacute or chronic. These did not appear to be present on previous imaging from September of 2020. IMPRESSION: 1. Supraglottic mass partially imaged on the first image of the data set. No signs of metastatic disease to the chest, abdomen or pelvis. 2. Rounded partially necrotic lymph node in the LEFT neck partially visualized, corresponding to level IIIb lymph node seen on the neck CT. 3. Basilar consolidative changes bilaterally with similar pattern of consolidation though decreased volume loss when compared to the study of September of 2020. Findings may be related to aspiration. No definite lesions seen in the chest though these areas could obscure underlying lesions. 4. Postoperative changes with small amount of pneumomediastinum presumably related to recent tracheostomy tube insertion. 5. Ununited fractures along posterior LEFT chest involving ribs 10 through 12. These appear subacute or chronic. These did not appear to be present on previous imaging from September of 2020. 6. Post abdominal wall reconstruction with rectus diastasis,  no frank hernia. 7. Aortic atherosclerosis. Aortic Atherosclerosis (ICD10-I70.0). Electronically  Signed   By: Zetta Bills M.D.   On: 03/15/2020 10:53   DG CHEST PORT 1 VIEW  Result Date: 03/31/2020 CLINICAL DATA:  Pleural effusion EXAM: PORTABLE CHEST 1 VIEW COMPARISON:  03/29/2020 FINDINGS: Tracheostomy unchanged. Lung volumes are small, however, pulmonary insufflation remains stable since prior examination. Bilateral pleural effusions are present small and decreased in size since prior examination. Ovoid opacity within the right mid lung zone likely represents fluid within the fissure. There is bibasilar atelectasis. Cardiac size is within normal limits. No acute bone abnormality. IMPRESSION: Slight interval decrease in small bilateral pleural effusions with associated bibasilar atelectasis. Pulmonary hypoinflation. Electronically Signed   By: Fidela Salisbury MD   On: 03/31/2020 05:41   DG CHEST PORT 1 VIEW  Result Date: 03/29/2020 CLINICAL DATA:  Dyspnea EXAM: PORTABLE CHEST 1 VIEW COMPARISON:  03/25/2020 FINDINGS: Tracheostomy unchanged. Pulmonary insufflation is symmetric though has decreased slightly since prior examination. Small bilateral pleural effusions have developed with associated bibasilar atelectasis. No pneumothorax. Cardiac size within normal limits. Pulmonary vascularity is normal. No acute bone abnormality. IMPRESSION: Interval development of small bilateral pleural effusions with bibasilar compressive atelectasis. Electronically Signed   By: Fidela Salisbury MD   On: 03/29/2020 06:00   DG CHEST PORT 1 VIEW  Result Date: 03/25/2020 CLINICAL DATA:  Hypoxia, shortness of breath, altered mental status history COPD, hypertension, history laryngeal carcinoma, tracheostomy, undergoing radiation therapy EXAM: PORTABLE CHEST 1 VIEW COMPARISON:  Portable exam 6294 hours compared to 03/23/2020 FINDINGS: Tracheostomy tube projects over tracheal air column. Normal heart size, mediastinal contours, and pulmonary vascularity. Improving opacity at LEFT lung base which could represent  atelectasis or consolidation. Subsegmental atelectasis RIGHT middle lobe slightly improved. Tiny LEFT pleural effusion. Remaining lungs clear. No pneumothorax. Bones demineralized. IMPRESSION: Persistent subsegmental atelectasis at RIGHT base with improving atelectasis versus consolidation and tiny pleural effusion at LEFT base. Electronically Signed   By: Lavonia Dana M.D.   On: 03/25/2020 10:33   DG CHEST PORT 1 VIEW  Result Date: 03/23/2020 CLINICAL DATA:  Hypoxia. EXAM: PORTABLE CHEST 1 VIEW COMPARISON:  Left 19 2021. FINDINGS: Interim removal of feeding tube. Tracheostomy tube in stable position. Cardiomegaly. Persistent bibasilar atelectasis/infiltrates and small bilateral pleural effusions. Density noted over the right mid chest may represent atelectasis and or pleural fluid pseudotumor, no interim change. IMPRESSION: 1. Interim removal of feeding tube. Tracheostomy tube in stable position. 2. Stable cardiomegaly. 3. Persistent bibasilar atelectasis/infiltrates and small bilateral pleural effusions. Density of the right mid chest may represent atelectasis and or pleural fluid pseudotumor, no interim change. Electronically Signed   By: Marcello Moores  Register   On: 03/23/2020 06:39   DG CHEST PORT 1 VIEW  Result Date: 03/19/2020 CLINICAL DATA:  Pneumonia. EXAM: PORTABLE CHEST 1 VIEW COMPARISON:  03/18/2020. FINDINGS: Tracheostomy tube, feeding tube in stable position. Stable cardiomegaly. Improved pulmonary venous congestion. Lung volumes. Persistent left base atelectasis/infiltrate. Persistent right base subsegmental atelectasis. Elliptical density noted over the right mid chest may represent atelectasis and or fissural fluid. Pleural effusions cannot be excluded. No pneumothorax. IMPRESSION: 1. Lines and tubes in stable position. 2. Stable cardiomegaly. Improved pulmonary venous congestion. 3. Persistent left base atelectasis/infiltrate. Persistent right base subsegmental atelectasis. Elliptical density  noted over the right mid chest may represent atelectasis and or fissural fluid. Small pleural effusions cannot be excluded. Electronically Signed   By: Marcello Moores  Register   On: 03/19/2020 06:03   DG Chest Port 1 [address]  Result Date: 03/18/2020 CLINICAL DATA:  Respiratory failure. EXAM: PORTABLE CHEST 1 VIEW COMPARISON:  03/17/2020. FINDINGS: Tracheostomy tube and feeding tube in stable position. Cardiomegaly. Mild pulmonary venous congestion. Mild bilateral interstitial prominence. Mild component of CHF cannot be excluded. Persistent left lower lobe atelectasis/infiltrate. New onset right mid lung prominent atelectatic changes. No pneumothorax. IMPRESSION: 1. Tracheostomy tube and feeding tube in stable position. 2. Cardiomegaly with mild pulmonary venous congestion and mild bilateral interstitial prominence. Mild component of CHF cannot be excluded. 3. Persistent left lower lobe atelectasis/infiltrate. New onset of right mid lung prominent atelectatic changes. Electronically Signed   By: Marcello Moores  Register   On: 03/18/2020 07:34   DG CHEST PORT 1 VIEW  Result Date: 03/17/2020 CLINICAL DATA:  Respiratory failure EXAM: PORTABLE CHEST 1 VIEW COMPARISON:  March 14, 2020 FINDINGS: Tracheostomy catheter tip is 6.2 cm above the carina. Feeding tube tip is below the diaphragm. No pneumothorax. There is a small left pleural effusion with consolidation in the left lower lobe. There is right base atelectasis with equivocal small right pleural effusion. Heart is mildly enlarged, stable, with pulmonary vascularity normal. No adenopathy. No bone lesions. IMPRESSION: Tube positions as described without pneumothorax. Left pleural effusion with equivocal right pleural effusion. Airspace opacity consistent with combination of atelectasis and pneumonia left lower lobe. Mild atelectasis right base. Stable cardiac prominence. Electronically Signed   By: Lowella Grip III M.D.   On: 03/17/2020 09:02   DG CHEST PORT 1  VIEW  Result Date: 03/14/2020 CLINICAL DATA:  Respiratory failure with hypercapnia EXAM: PORTABLE CHEST 1 VIEW COMPARISON:  March 10, 2020 FINDINGS: Tracheostomy catheter tip is 6.2 cm above the carina. There is ill-defined airspace opacity in the left upper lobe and left base regions with equivocal left pleural effusion. The right lung is clear. Heart is upper normal in size with pulmonary vascularity normal. No adenopathy. No bone lesions. IMPRESSION: Tracheostomy as described without pneumothorax. Patchy airspace opacity consistent with scattered areas of pneumonia in the left upper lobe and left base. Equivocal left pleural effusion. Right lung clear. Stable cardiac silhouette. Electronically Signed   By: Lowella Grip III M.D.   On: 03/14/2020 09:17   DG Chest Portable 1 View  Result Date: 03/10/2020 CLINICAL DATA:  Shortness of breath and cough EXAM: PORTABLE CHEST 1 VIEW COMPARISON:  January 23, 2020 FINDINGS: There is bibasilar atelectatic change. Elsewhere the interstitium is mildly thickened. No consolidation. Heart size and pulmonary vascularity are normal. No adenopathy. There is degenerative change in each shoulder. IMPRESSION: Bibasilar atelectasis. Interstitial thickening likely represents a degree of underlying chronic bronchitis. No edema or airspace opacity. Heart size within normal limits. Electronically Signed   By: Lowella Grip III M.D.   On: 03/10/2020 12:42   DG Swallowing Func-Speech Pathology  Result Date: 03/27/2020 Objective Swallowing Evaluation: Type of Study: MBS-Modified Barium Swallow Study  Patient Details Name: BRITTON PERKINSON MRN: [id number] Date of Birth: Feb 20, 1961 Today's Date: 03/27/2020 Time: SLP Start Time (ACUTE ONLY): 9983 -SLP Stop Time (ACUTE ONLY): 1235 SLP Time Calculation (min) (ACUTE ONLY): 20 min Past Medical History: Past Medical History: Diagnosis Date . Bronchitis  . Class 3 obesity 01/23/2020 . COPD (chronic obstructive pulmonary disease)  ([place])  . Degenerative disc disease, lumbar  . Hiatal hernia  . Hypertension  Past Surgical History: Past Surgical History: Procedure Laterality Date . CHOLECYSTECTOMY   . degenerative bone disease   . DIRECT LARYNGOSCOPY N/A 03/13/2020  Procedure: DIRECT LARYNGOSCOPY WITH BIOPSY;  Surgeon: Ebbie Latus A, DO;  Location: [facility];  Service: ENT;  Laterality: N/A; . INCISIONAL HERNIA REPAIR N/A 01/15/2019  Procedure: [name] HERNIORRHAPHY WITH MESH;  Surgeon: [name], MD;  Location: [facility];  Service: General;  Laterality: N/A; . IR GASTROSTOMY TUBE MOD SED  03/22/2020 . OMENTECTOMY N/A 01/15/2019  Procedure: OMENTECTOMY;  Surgeon: [name], MD;  Location: [facility];  Service: General;  Laterality: N/A; . TRACHEOSTOMY TUBE PLACEMENT N/A 03/13/2020  Procedure: AWAKE TRACHEOSTOMY;  Surgeon: Jason Coop, DO;  Location: [facility];  Service: ENT;  Laterality: N/A; HPI: Krystale Rinkenberger is a 59 y/o F with history of COPD,chronic respiratory failure on home oxygen at 5 L/min, with large laryngeal mass lesion, now s/p tracheostomy with Shiley 6-0 and direct laryngoscopy and biopsy. 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  Pt is also s/p PEG for nutrition.  Plans are for chemo and radiation concurrently starting while in hospital.  Prior medical history includes smoking and some dyspnea if walking long distanced.  Subjective: pt awake in chair Assessment / Plan / Recommendation CHL IP CLINICAL IMPRESSIONS 03/27/2020 Clinical Impression Pt continues with with mild pharyngeal phase dysphagia - obstructive due to mass impacting epiglottic deflection.  In addition, she requires extra time to masticate solids due to dentition.  Swallow trigger is timely with minimal laryngeal penetration of liquid into larynx during the swallow due to decreased epiglottic deflection.  Min vallecular residue with solids without pt awareness - liquid wash assisted to clear.  Pt with prominent cricopharyngeus and  appears with trace backflow near this region but view was suboptimal.  Recommend D3/mech soft and thin liquids, po medications whole in puree and follow with liquid wash.  Of note, following testing, pt did cough and slight whitish coating noted on secretions but no aspiration noted during testing.  Suspect pt has some low grade chronic secretion aspiration. SLP Visit Diagnosis Dysphagia, oropharyngeal phase (R13.12) Attention and concentration deficit following -- Frontal lobe and executive function deficit following -- Impact on safety and function Mild aspiration risk   CHL IP TREATMENT RECOMMENDATION 03/27/2020 Treatment Recommendations Therapy as outlined in treatment plan below   Prognosis 03/27/2020 Prognosis for Safe Diet Advancement Fair Barriers to Reach Goals Other (Comment) Barriers/Prognosis Comment -- CH[facility] IP DIET RECOMMENDATION 03/27/2020 SLP Diet Recommendations Dysphagia 3 (Mech soft) solids;Thin liquid Liquid Administration via -- Medication Administration Whole meds with puree Compensations Slow rate;Small sips/bites Postural Changes --   CHL IP OTHER RECOMMENDATIONS 03/27/2020 Recommended Consults (No Data) Oral Care Recommendations Oral care BID Other Recommendations Clarify dietary restrictions   [facility] IP FOLLOW UP RECOMMENDATIONS 03/27/2020 Follow up Recommendations Skilled Nursing facility   [facility] IP FREQUENCY AND DURATION 03/27/2020 Speech Therapy Frequency (ACUTE ONLY) min 1 x/week Treatment Duration 1 week      CHL IP ORAL PHASE 03/27/2020 Oral Phase WFL Oral - Pudding Teaspoon -- Oral - Pudding Cup -- Oral - Honey Teaspoon -- Oral - Honey Cup -- Oral - Nectar Teaspoon -- Oral - Nectar Cup -- Oral - Nectar Straw -- Oral - Thin Teaspoon -- Oral - Thin Cup -- Oral - Thin Straw -- Oral - Puree -- Oral - Mech Soft -- Oral - Regular -- Oral - Multi-Consistency -- Oral - Pill -- Oral Phase - Comment --  CHL IP PHARYNGEAL PHASE 03/27/2020 Pharyngeal Phase -- Pharyngeal- Pudding Teaspoon -- Pharyngeal  -- Pharyngeal- Pudding Cup -- Pharyngeal -- Pharyngeal- Honey Teaspoon -- Pharyngeal -- Pharyngeal- Honey Cup -- Pharyngeal -- Pharyngeal- Nectar Teaspoon -- Pharyngeal -- Pharyngeal- Nectar Cup --  Pharyngeal -- Pharyngeal- Nectar Straw Reduced epiglottic inversion;Pharyngeal residue - valleculae Pharyngeal -- Pharyngeal- Thin Teaspoon Reduced epiglottic inversion;Pharyngeal residue - valleculae Pharyngeal -- Pharyngeal- Thin Cup Pharyngeal residue - valleculae;Reduced epiglottic inversion;Penetration/Aspiration during swallow Pharyngeal Material enters airway, remains ABOVE vocal cords then ejected out Pharyngeal- Thin Straw Reduced epiglottic inversion;Pharyngeal residue - valleculae;Penetration/Aspiration during swallow Pharyngeal Material enters airway, remains ABOVE vocal cords then ejected out Pharyngeal- Puree WFL Pharyngeal -- Pharyngeal- Mechanical Soft -- Pharyngeal -- Pharyngeal- Regular Reduced epiglottic inversion Pharyngeal -- Pharyngeal- Multi-consistency -- Pharyngeal -- Pharyngeal- Pill NT Pharyngeal -- Pharyngeal Comment --  CHL IP CERVICAL ESOPHAGEAL PHASE 03/27/2020 Cervical Esophageal Phase -- Pudding Teaspoon -- Pudding Cup -- Honey Teaspoon -- Honey Cup -- Nectar Teaspoon -- Nectar Cup -- Nectar Straw -- Thin Teaspoon -- Thin Cup Prominent cricopharyngeal segment Thin Straw -- Puree -- Mechanical Soft -- Regular -- Multi-consistency -- Pill -- Cervical Esophageal Comment appearance of potential minimal backflow of liquid near UES region = suboptimal view - did not backflow into pharynx/larynx [name], MS [facility] SLP Acute Rehab Services Office [phone] Pager [phone] [name] 03/27/2020, 2:31 PM              DG Swallowing Func-Speech Pathology  Result Date: 03/11/2020 Objective Swallowing Evaluation: Type of Study: MBS-Modified Barium Swallow Study  Patient Details Name: STACI DACK MRN: [id number] Date of Birth: 20-Mar-1961 Today's Date: 03/11/2020 Time: SLP Start Time  (ACUTE ONLY): 3557 -SLP Stop Time (ACUTE ONLY): 1316 SLP Time Calculation (min) (ACUTE ONLY): 21 min Past Medical History: Past Medical History: Diagnosis Date . Bronchitis  . Class 3 obesity 01/23/2020 . COPD (chronic obstructive pulmonary disease) ([place])  . Degenerative disc disease, lumbar  . Hiatal hernia  . Hypertension  Past Surgical History: Past Surgical History: Procedure Laterality Date . CHOLECYSTECTOMY   . degenerative bone disease   . INCISIONAL HERNIA REPAIR N/A 01/15/2019  Procedure: [name] HERNIORRHAPHY WITH MESH;  Surgeon: [name], MD;  Location: [facility];  Service: General;  Laterality: N/A; . OMENTECTOMY N/A 01/15/2019  Procedure: OMENTECTOMY;  Surgeon: [name], MD;  Location: [facility];  Service: General;  Laterality: N/A; HPI: Lashawnna Lambrecht  is a 59 y.o. female, with medical history significant of class III obesity, hiatal hernia, hypertension, bronchitis, COPD, chronic respiratory failure on home oxygen at 5 LPM, active smoker of 1-1/2 packs of cigarettes per day , patient presents to ED secondary to shortness of breath and altered mental status, patient with receptive worsening dyspnea over the last 4 days, she is not vaccinated against Covid, she is altered at the time of my examination, history was obtained from boyfriend at bedside, and ED staff, patient still smoking, she still using 5 L of oxygen, he is with increased work of breathing, significantly dyspneic, upon presentation to ED she is altered not provide any complaints at this point. BSE requested.  Subjective: "I haven't been able to swallow my pills for a couple weeks." Assessment / Plan / Recommendation CHL IP CLINICAL IMPRESSIONS 03/11/2020 Clinical Impression Pt presents with mild pharyngeal phase dysphagia, however appearance of anatomy appears edematous (epiglottis and aryepiglottic folds). Pt with limited dentition and requires extra time to masticate solids, swallow trigger generally at the level of the valleculae,  Pt with blunted appearance of epiglottis with reduced deflection resulting in variable trace, flash penetration of thins during the swallow without aspiration and min vallecular residue with solids and brief stasis of barium tablet in valleculae. Pt with prominent cricopharyngeus. Recommend D3/mech soft and thin  liquids, po medications whole in puree and follow with liquid wash or per Pt preference. Also strongly recommend additional imaging (neck CT) and ENT consult pending those results. SLP will follow pending results of imaging. Above to RN and MD.  SLP Visit Diagnosis Dysphagia, oropharyngeal phase (R13.12) Attention and concentration deficit following -- Frontal lobe and executive function deficit following -- Impact on safety and function Mild aspiration risk   CHL IP TREATMENT RECOMMENDATION 03/11/2020 Treatment Recommendations No treatment recommended at this time   Prognosis 03/11/2020 Prognosis for Safe Diet Advancement Fair Barriers to Reach Goals Severity of deficits Barriers/Prognosis Comment -- CHL IP DIET RECOMMENDATION 03/11/2020 SLP Diet Recommendations Dysphagia 3 (Mech soft) solids;Thin liquid Liquid Administration via Cup;Straw Medication Administration Whole meds with puree Compensations Slow rate;Small sips/bites Postural Changes Remain semi-upright after after feeds/meals (Comment);Seated upright at 90 degrees   CHL IP OTHER RECOMMENDATIONS 03/11/2020 Recommended Consults Consider ENT evaluation Oral Care Recommendations Oral care BID Other Recommendations Clarify dietary restrictions   CHL IP FOLLOW UP RECOMMENDATIONS 03/11/2020 Follow up Recommendations None   CHL IP FREQUENCY AND DURATION 03/11/2020 Speech Therapy Frequency (ACUTE ONLY) min 2x/week Treatment Duration 1 week      CHL IP ORAL PHASE 03/11/2020 Oral Phase WFL Oral - Pudding Teaspoon -- Oral - Pudding Cup -- Oral - Honey Teaspoon -- Oral - Honey Cup -- Oral - Nectar Teaspoon -- Oral - Nectar Cup -- Oral - Nectar Straw -- Oral -  Thin Teaspoon -- Oral - Thin Cup -- Oral - Thin Straw -- Oral - Puree -- Oral - Mech Soft -- Oral - Regular -- Oral - Multi-Consistency -- Oral - Pill -- Oral Phase - Comment --  CHL IP PHARYNGEAL PHASE 03/11/2020 Pharyngeal Phase Impaired Pharyngeal- Pudding Teaspoon -- Pharyngeal -- Pharyngeal- Pudding Cup -- Pharyngeal -- Pharyngeal- Honey Teaspoon -- Pharyngeal -- Pharyngeal- Honey Cup -- Pharyngeal -- Pharyngeal- Nectar Teaspoon -- Pharyngeal -- Pharyngeal- Nectar Cup -- Pharyngeal -- Pharyngeal- Nectar Straw Pharyngeal residue - valleculae;Reduced epiglottic inversion Pharyngeal -- Pharyngeal- Thin Teaspoon Delayed swallow initiation-vallecula;Reduced epiglottic inversion Pharyngeal -- Pharyngeal- Thin Cup Reduced epiglottic inversion;Penetration/Aspiration during swallow Pharyngeal Material enters airway, remains ABOVE vocal cords then ejected out Pharyngeal- Thin Straw Reduced epiglottic inversion;Penetration/Aspiration during swallow Pharyngeal Material enters airway, remains ABOVE vocal cords then ejected out Pharyngeal- Puree WFL Pharyngeal -- Pharyngeal- Mechanical Soft -- Pharyngeal -- Pharyngeal- Regular Pharyngeal residue - valleculae;Reduced epiglottic inversion Pharyngeal -- Pharyngeal- Multi-consistency -- Pharyngeal -- Pharyngeal- Pill Pharyngeal residue - valleculae;Reduced epiglottic inversion Pharyngeal -- Pharyngeal Comment edematous pharynx  CHL IP CERVICAL ESOPHAGEAL PHASE 03/11/2020 Cervical Esophageal Phase Impaired Pudding Teaspoon -- Pudding Cup -- Honey Teaspoon -- Honey Cup -- Nectar Teaspoon -- Nectar Cup -- Nectar Straw -- Thin Teaspoon -- Thin Cup Prominent cricopharyngeal segment Thin Straw -- Puree -- Mechanical Soft -- Regular -- Multi-consistency -- Pill -- Cervical Esophageal Comment -- Thank you, [name], Howell Dover 03/11/2020, 2:12 PM              ECHOCARDIOGRAM COMPLETE  Result Date: 03/11/2020    ECHOCARDIOGRAM REPORT   Patient Name:    CELLA CAPPELLO Date of Exam: 03/11/2020 Medical Rec #:  [id number]       Height:       65.0 in Accession #:    [id number]      Weight:       244.0 lb Date of Birth:  14-Sep-1960       BSA:  2.153 m Patient Age:    59 years        BP:           115/35 mmHg Patient Gender: F               HR:           57 bpm. Exam Location:  [facility] Procedure: 2D Echo Indications:    Dyspnea 786.09 / R06.00  History:        Patient has prior history of Echocardiogram examinations, most                 recent 01/16/2019. COPD; Risk Factors:Hypertension and Current                 Smoker. Acute respiratory failure with hypoxia.  Sonographer:    Leavy Cella RDCS (AE) Referring Phys: [id number] [place] [address] [place] IMPRESSIONS  1. Left ventricular ejection fraction, by estimation, is 70 to 75%. The left ventricle has hyperdynamic function. The left ventricle has no regional wall motion abnormalities. There is mild left ventricular hypertrophy. Left ventricular diastolic parameters were normal.  2. Right ventricular systolic function is normal. The right ventricular size is normal. Tricuspid regurgitation signal is inadequate for assessing PA pressure.  3. The mitral valve is grossly normal. Trivial mitral valve regurgitation.  4. The aortic valve is tricuspid. Aortic valve regurgitation is not visualized.  5. The inferior vena cava is normal in size with greater than 50% respiratory variability, suggesting right atrial pressure of 3 mmHg. FINDINGS  Left Ventricle: Left ventricular ejection fraction, by estimation, is 70 to 75%. The left ventricle has hyperdynamic function. The left ventricle has no regional wall motion abnormalities. The left ventricular internal cavity size was normal in size. There is mild left ventricular hypertrophy. Left ventricular diastolic parameters were normal. Right Ventricle: The right ventricular size is normal. No increase in right ventricular wall thickness. Right ventricular systolic function is  normal. Tricuspid regurgitation signal is inadequate for assessing PA pressure. Left Atrium: Left atrial size was normal in size. Right Atrium: Right atrial size was normal in size. Pericardium: There is no evidence of pericardial effusion. Mitral Valve: The mitral valve is grossly normal. Trivial mitral valve regurgitation. Tricuspid Valve: The tricuspid valve is grossly normal. Tricuspid valve regurgitation is trivial. Aortic Valve: The aortic valve is tricuspid. Aortic valve regurgitation is not visualized. Pulmonic Valve: The pulmonic valve was not well visualized. Pulmonic valve regurgitation is not visualized. Aorta: The aortic root is normal in size and structure. Venous: The inferior vena cava is normal in size with greater than 50% respiratory variability, suggesting right atrial pressure of 3 mmHg. IAS/Shunts: No atrial level shunt detected by color flow Doppler.  LEFT VENTRICLE PLAX 2D LVIDd:         4.05 cm  Diastology LVIDs:         2.30 cm  LV e' medial:    8.59 cm/s LV PW:         1.35 cm  LV E/e' medial:  12.2 LV IVS:        1.34 cm  LV e' lateral:   11.00 cm/s LVOT diam:     2.00 cm  LV E/e' lateral: 9.5 LVOT Area:     3.14 cm  RIGHT VENTRICLE RV S prime:     15.40 cm/s TAPSE (M-mode): 2.9 cm LEFT ATRIUM             Index       RIGHT  ATRIUM           Index LA diam:        4.70 cm 2.18 cm/m  RA Area:     13.50 cm LA Vol (A2C):   73.3 ml 34.05 ml/m RA Volume:   35.40 ml  16.44 ml/m LA Vol (A4C):   39.3 ml 18.26 ml/m LA Biplane Vol: 55.6 ml 25.83 ml/m   AORTA Ao Root diam: 2.70 cm MITRAL VALVE MV Area (PHT): 2.69 cm     SHUNTS MV Decel Time: 282 msec     Systemic Diam: 2.00 cm MV E velocity: 105.00 cm/s MV A velocity: 57.80 cm/s MV E/A ratio:  1.82 Rozann Lesches MD Electronically signed by Rozann Lesches MD Signature Date/Time: 03/11/2020/4:59:31 PM    Final     ASSESSMENT AND PLAN: 59 year old Caucasian female, with medical history of heavy smoking, COPD on home oxygen 5 L/min,  hypertension, presents with worsening dyspnea and altered mental status.  Work-up showed a large laryngeal mass.  Patient required urgent tracheostomy.  1.  Supraglottic laryngeal cancer, [id number] 2. COPD with acute exacerbation, on chronic home oxygen 5 L/min 3. HTN  4.  Heavy smoking history  Recommendations:  She has T4aN2c supraglottic laryngeal cancer, with compromised airway, required urgent tracheostomy.  -For staging scan, we ideally would like to get a PET scan.  However this is not feasible while she is in hospital, and there is quite bit social difficult to get her treated as outpt (she lives an hour away and has no transportation). CT C/A/P performed did not show evidence of distant mets.  - Ideally for T4a tumors, total laryngectomy with bilateral neck dissection is the way to go but patient refused surgery. I did explain to her if patient declines surgery, we can proceed with concurrent chemo radiation although it is likely that she may need salvage surgery if she has incomplete response. Given baseline PS, she is not an ideal candidate for every 21 day cisplatin, hence we offered weekly cisplatin. Started concurrent CRT on 03/29/2020 CBC and CMP reviewed, mild thrombocytopenia, creatinine appears well. Needs repeat CBC and CMP on Monday before chemotherapy administration. We will continue to monitor her periodically Please let us know about her disposition so we can arrange for FU outpatient. She is already on guaifenesin for mucolytic, will discuss with primary team.  Ok to proceed with cisplatin on Monday, provided labs are within parameters and no contraindication to infections.  Benay Pike, MD

## 2020-04-02 NOTE — Progress Notes (Signed)
Nutrition Note  SLP requesting pt's tube feeding be resumed given increased secretions, chronic cough and increased oxygen needs. MD agreed to restart of tube feeds.  RD to place orders for bolus feeds: -Osmolite 1.5 TID via PEG.  -Please flush with 60 ml of free water before and after each bolus. -This will provide 1065 kcals, 44 g protein and 903 ml H2O.  Pt continues on regular diet. Consumed 75% of meals today.   If further nutrition issues arise, please consult RD.   Clayton Bibles, MS, RD, LDN Inpatient Clinical Dietitian Contact information available via Amion

## 2020-04-02 NOTE — Plan of Care (Signed)
  Problem: Education: Goal: Knowledge of General Education information will improve Description: Including pain rating scale, medication(s)/side effects and non-pharmacologic comfort measures Outcome: Progressing  Patient seems to be well versed on medications, most trach care requirements & nutrition goals. Problem: Coping: Goal: Level of anxiety will decrease Outcome: Progressing  Patient seems to be coping best to her ability. Blinds opened ever am for sunlight, crossword puzzles at bedside & tv on for patient.  Problem: Nutritional: Goal: Maintenance of adequate nutrition will improve Outcome: Progressing She is eating very well & drinking her ensure supplements between meals. Consumes 60-100% of each meal & drinks the 2 ensures each day

## 2020-04-03 ENCOUNTER — Inpatient Hospital Stay (HOSPITAL_COMMUNITY): Payer: Medicaid Other

## 2020-04-03 LAB — GLUCOSE, CAPILLARY
Glucose-Capillary: 108 mg/dL — ABNORMAL HIGH (ref 70–99)
Glucose-Capillary: 154 mg/dL — ABNORMAL HIGH (ref 70–99)
Glucose-Capillary: 120 mg/dL — ABNORMAL HIGH (ref 70–99)
Glucose-Capillary: 101 mg/dL — ABNORMAL HIGH (ref 70–99)
Glucose-Capillary: 146 mg/dL — ABNORMAL HIGH (ref 70–99)

## 2020-04-03 MED ORDER — FLEET ENEMA 7-19 GM/118ML RE ENEM
1.0000 | ENEMA | Freq: Once | RECTAL | Status: AC
  Administered 2020-04-03: 1 via RECTAL
  Filled 2020-04-03: qty 1

## 2020-04-03 MED ORDER — GUAIFENESIN-DM 100-10 MG/5ML PO SYRP
10.0000 mL | ORAL_SOLUTION | ORAL | Status: DC | PRN
  Administered 2020-04-03 – 2020-04-07 (×11): 10 mL via ORAL
  Filled 2020-04-03 (×11): qty 10

## 2020-04-03 NOTE — Progress Notes (Signed)
Pt called RN to room and communicated via writing that that she was having trouble breathing. Pt presented with increased work of breathing and a congested cough. BL Lung sounds were found to have increased rhonchi than previous shift assessment. O2 noted to be 92% on 60%FiO2.  Pt wrote down on paper that she, "had a mucus plug stuck in her trach". Respiratory therapist and rapid response team called to bedside. Pt trach was suctioned and a minimal amount of thick pink tinged sputum was removed. Pt was pulled up in bed and HOB elevated. Pt stated that she felt better after interventions carried out.  MD notified. New orders placed. Will continue to monitor.

## 2020-04-03 NOTE — Progress Notes (Signed)
Attempted to see again today x 2.  Patient receiving personal care and not able to meet.  Will continue to attempt follow-up.  [name], MD [facility] Team [phone]  NO CHARGE NOTE

## 2020-04-03 NOTE — Progress Notes (Signed)
Patient ID: Veronica Horton, female   DOB: June 26, 1960, 59 y.o.   MRN: [id number]  PROGRESS NOTE    Veronica Horton  OVZ:[id number] DOB: [birth year] DOA: 03/10/2020 PCP: [facility]   Brief Narrative:  59 year old female with history of morbid obesity, hiatal hernia, hypertension, COPD, chronic respiratory failure on home oxygen at 5 L/min, smoker presented with worsening shortness of breath and altered mental status.  On presentation, ABG showed PCO2 of 92 requiring BiPAP. She was found to have laryngeal mass concerning for malignancy and she underwent biopsy which showed invasive squamous cell carcinoma and she required tracheostomy. She had to be transferred to ICU post biopsy and had to be kept on ventilator support. She was transferred to [facility] on 11/17 for radiation therapy. She was transferred to [facility] service from [facility] service on 03/20/2020. UnderwentPEG placementby IRon 03/22/2020. She started concurrent chemo radiationon 11/29.  Currently working on discharge planning.  Therapy recommending home health with 24 hr supervision/assistance.  CM currently working on [facility], some difficulty with finding accepting agency.   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 Found to have airway obstruction/compromise due to laryngeal mass. CT soft tissue neck on 11/11 showed bulky bilateral supraglottic tumor with possible airway compromise, involvement of right laryngeal cartilages. ENT was consulted. She underwent laryngeal biopsy which showed squamous cell carcinoma.  -Status post tracheostomy -UnderwentPEG placement by IR on 11/22. -CT chest/abdomen/pelvis did not show signs of metastatic disease. -She was recommended total laryngectomy with bilateral neck dissection but she declined surgery.  -Oncology/radiation oncologyfollowing.  Per oncology, started chemoradiation 11/29.   Next chemotherapy on Monday as per oncology. -Diet as per SLP recommendations.  Continue tube feeding as per dietary -palliative care for goals of care discussion is pending.  Acute on chronic hypoxic/hypercarbic respiratory failure: History of COPD, on oxygen at home at 5 L/min. Chronic respiratory failure exacerbated by laryngeal squamous cell carcinoma, COPD exacerbation, pneumonia.  -Hospital course was remarkable for Serratia pneumonia. Finished the course of antibiotics with levofloxacin.  -PCCM following: Size 6 cuffed Shiley removed and size 6 uncuffed Shiley placed by PCCM on 04/01/2020.  Currently still requiring 7 L oxygen via trach -Repeat CXR 11/25 with persistent subsegmental atelectasis at R base with improving atelectasis vs consolidation and tiny effusion at L base -Repeat CXR 11/29 with interval small bilateral effusions -> received lasix 11/29 and 11/30, follow   COPD exacerbation:  -Continue pulmonary hygiene, ventilator support at night, trach collar during the day. Follow intermittent chest x-ray. Aspiration precautions.Steroids have been tapered off. No wheezing appreciated. -Continue nebs  Hypoglycemia: -Continue tube feeds and increase oral intake as tolerated  GERD: Continue PPI  Hypertension:Monitor blood pressure. Off antihypertensives which she was at home. Blood pressure okay.  AKI: Resolved  Thrombocytopenia: resolved.  Disposition:therapy now recommending HH and 24 hr supervision/assistance, CM working on home health, but apparently no accepting agency.  Will need to ensure she's able to manage trach and o2 needs at home as well.     DVT prophylaxis:lovenox Code Status: Full Family Communication:  None at bedside Disposition Plan: Status is: Inpatient  Remains inpatient appropriate because:Inpatient level of care appropriate due to severity of illness   Dispo: The patient is from: Home              Anticipated d/c is to:  Home  Anticipated d/c date is: > 3 days              Patient currently is medically stable to d/c.  If appropriate home health agency can be found   Consultants: PCCM/ENT/IR/oncology/radiation oncology  Procedures:  Intubation/extubation Tracheostomy PEG tube placement  Echo IMPRESSIONS    1. Left ventricular ejection fraction, by estimation, is 70 to 75%. The  left ventricle has hyperdynamic function. The left ventricle has no  regional wall motion abnormalities. There is mild left ventricular  hypertrophy. Left ventricular diastolic  parameters were normal.  2. Right ventricular systolic function is normal. The right ventricular  size is normal. Tricuspid regurgitation signal is inadequate for assessing  PA pressure.  3. The mitral valve is grossly normal. Trivial mitral valve  regurgitation.  4. The aortic valve is tricuspid. Aortic valve regurgitation is not  visualized.  5. The inferior vena cava is normal in size with greater than 50%  respiratory variability, suggesting right atrial pressure of 3 mmHg.   Antimicrobials:  Anti-infectives (From admission, onward)   Start     Dose/Rate Route Frequency Ordered Stop   03/22/20 1700  vancomycin (VANCOCIN) IVPB 1000 mg/200 mL premix  Status:  Discontinued        1,000 mg 200 mL/hr over 60 Minutes Intravenous  Once 03/22/20 1605 03/23/20 0236   03/22/20 1604  vancomycin (VANCOCIN) 1-5 GM/200ML-% IVPB       Note to Pharmacy: [name]   : cabinet override      03/22/20 1604 03/22/20 1615   03/14/20 1100  levofloxacin (LEVAQUIN) IVPB 750 mg        750 mg 100 mL/hr over 90 Minutes Intravenous Daily 03/14/20 0955 03/19/20 1146   03/10/20 1730  doxycycline (VIBRAMYCIN) 100 mg in sodium chloride 0.9 % 250 mL IVPB  Status:  Discontinued        100 mg 125 mL/hr over 120 Minutes Intravenous Every 12 hours 03/10/20 1634 03/14/20 0955   03/10/20 1600  azithromycin (ZITHROMAX) 500 mg in sodium chloride 0.9 % 250  mL IVPB  Status:  Discontinued        500 mg 250 mL/hr over 60 Minutes Intravenous Every 24 hours 03/10/20 1529 03/10/20 1538   03/10/20 1500  clindamycin (CLEOCIN) IVPB 600 mg  Status:  Discontinued        600 mg 100 mL/hr over 30 Minutes Intravenous  Once 03/10/20 1458 03/10/20 1521       Subjective: Patient seen and examined at bedside.  Poor historian.  No overnight fever, vomiting reported.  Nods her head to some questions  objective: Vitals:   04/02/20 2145 04/03/20 0043 04/03/20 0334 04/03/20 0340  BP:   121/64   Pulse:   71   Resp:  17 20   Temp:   99.1 F (37.3 C)   TempSrc:      SpO2: 93%  95%   Weight:    123.5 kg  Height:        Intake/Output Summary (Last 24 hours) at 04/03/2020 0802 Last data filed at 04/03/2020 0732 Gross per 24 hour  Intake 480 ml  Output 1700 ml  Net -1220 ml   Filed Weights   03/30/20 0500 04/01/20 1700 04/03/20 0340  Weight: 94.6 kg 115.8 kg 123.5 kg    Examination:  General exam: No acute distress looks chronically ill.  Still on 7 L oxygen via tracheostomy  ENT: Tracheostomy is present respiratory system: Decreased breath sounds at bases bilaterally with some  crackles  cardiovascular system: S1-S2 heard, rate controlled  gastrointestinal system: Abdomen is obese, nondistended, soft and nontender.  Bowel sounds heard.  PEG tube is present.   Extremities: Trace lower extremity edema present.  No clubbing  Central nervous system: Sleepy, wakes up slightly, nods her head to some questions.  No focal neurological deficits.  Moves extremities Skin: No obvious petechiae/lesions  psychiatry: Affect is flat   Data Reviewed: I have personally reviewed following labs and imaging studies  CBC: Recent Labs  Lab 03/28/20 0646 03/29/20 0515 03/30/20 0819 03/31/20 0611 04/02/20 1446  WBC 7.9 7.0 9.8 8.6 5.5  NEUTROABS 5.7 4.8 9.1* 6.8 4.2  HGB 12.3 12.1 13.3 12.5 12.0  HCT 38.0 37.7 41.1 39.2 37.6  MCV 98.7 99.0 97.9 99.0 99.5  PLT  152 149* 169 170 045*   Basic Metabolic Panel: Recent Labs  Lab 03/28/20 0646 03/29/20 0515 03/30/20 0819 03/31/20 0611 04/02/20 1446  NA 139 138 138 135 135  K 4.1 4.4 4.9 4.8 4.1  CL 105 103 103 99 99  CO2 27 27 27 29 28   GLUCOSE 134* 130* 124* 94 101*  BUN 17 19 26* 34* 20  CREATININE 0.95 0.97 0.90 1.19* 0.99  CALCIUM 8.4* 8.4* 8.9 8.6* 8.5*  MG 2.3 2.3 2.7* 2.4  --   PHOS 4.4 4.4 4.1 5.4*  --    GFR: Estimated Creatinine Clearance: 79.8 mL/min (by C-G formula based on SCr of 0.99 mg/dL). Liver Function Tests: Recent Labs  Lab 03/28/20 0646 03/29/20 0515 03/30/20 0819 03/31/20 0611 04/02/20 1446  AST 13* 14* 17 19 15   ALT 16 15 16 22 18   ALKPHOS 52 53 56 52 61  BILITOT 0.6 0.7 0.8 0.5 0.6  PROT 5.4* 5.6* 6.4* 5.8* 5.9*  ALBUMIN 2.6* 2.7* 2.9* 2.9* 3.0*   No results for input(s): LIPASE, AMYLASE in the last 168 hours. No results for input(s): AMMONIA in the last 168 hours. Coagulation Profile: No results for input(s): INR, PROTIME in the last 168 hours. Cardiac Enzymes: No results for input(s): CKTOTAL, CKMB, CKMBINDEX, TROPONINI in the last 168 hours. BNP (last 3 results) No results for input(s): PROBNP in the last 8760 hours. HbA1C: No results for input(s): HGBA1C in the last 72 hours. CBG: Recent Labs  Lab 04/02/20 1217 04/02/20 1559 04/02/20 2006 04/02/20 2337 04/03/20 0337  GLUCAP 91 100* 120* 93 108*   Lipid Profile: No results for input(s): CHOL, HDL, LDLCALC, TRIG, CHOLHDL, LDLDIRECT in the last 72 hours. Thyroid Function Tests: No results for input(s): TSH, T4TOTAL, FREET4, T3FREE, THYROIDAB in the last 72 hours. Anemia Panel: No results for input(s): VITAMINB12, FOLATE, FERRITIN, TIBC, IRON, RETICCTPCT in the last 72 hours. Sepsis Labs: No results for input(s): PROCALCITON, LATICACIDVEN in the last 168 hours.  Recent Results (from the past 240 hour(s))  Culture, respiratory (non-expectorated)     Status: None (Preliminary result)    Collection Time: 04/02/20  3:36 PM   Specimen: Tracheal Aspirate; Respiratory  Result Value Ref Range Status   Specimen Description   Final    TRACHEAL ASPIRATE Performed at [facility] [address] [place], [place] [address]    Special Requests   Final    NONE Performed at [facility], [place] [address] [place], [place] [address]    Gram Stain   Final    RARE WBC PRESENT, PREDOMINANTLY PMN RARE GRAM POSITIVE COCCI RARE GRAM NEGATIVE RODS Performed at [facility], [place],  [place] [address]    Culture PENDING  Incomplete   Report Status PENDING  Incomplete         Radiology Studies: No results found.      Scheduled Meds: . arformoterol  15 mcg Nebulization BID  . budesonide (PULMICORT) nebulizer solution  0.5 mg Nebulization BID  . chlorhexidine  15 mL Mouth Rinse BID  . Chlorhexidine Gluconate Cloth  6 each Topical Daily  . docusate  100 mg Per Tube BID  . enoxaparin (LOVENOX) injection  60 mg Subcutaneous Daily  . feeding supplement  237 mL Oral TID BM  . feeding supplement (OSMOLITE 1.5 CAL)  237 mL Per Tube TID BM  . gabapentin  600 mg Oral QHS  . insulin aspart  0-9 Units Subcutaneous Q4H  . ipratropium-albuterol  3 mL Nebulization TID  . ketotifen  1 drop Both Eyes BID  . lidocaine  1 patch Transdermal Q24H  . mouth rinse  15 mL Mouth Rinse q12n4p  . melatonin  5 mg Oral QHS  . multivitamin with minerals  1 tablet Per Tube Daily  . nicotine  21 mg Transdermal Daily  . pantoprazole sodium  40 mg Per Tube Daily  . scopolamine  1 patch Transdermal Q72H   Continuous Infusions: . sodium chloride            Aline August, MD [facility] 04/03/2020, 8:02 AM

## 2020-04-03 NOTE — Progress Notes (Signed)
Palliative care brief progress note  Palliative care consult received.  Chart reviewed including review of pertinent labs and imaging.  I met briefly with Veronica Horton this evening.  She was working on eating dinner at the time of my encounter and was very much focused on this.  I introduced palliative care as specialized medical care for people living with serious illness. It focuses on providing relief from the symptoms and stress of a serious illness. The goal is to improve quality of life for both the patient and the family.  I offered to come back to meet with her tomorrow if she would prefer as she was focusing on trying to eat. She expressed this would be her preference.  Veronica Rough, MD [place] [facility] [phone]  No charge note

## 2020-04-04 DIAGNOSIS — Z515 Encounter for palliative care: Secondary | ICD-10-CM

## 2020-04-04 DIAGNOSIS — Z7189 Other specified counseling: Secondary | ICD-10-CM

## 2020-04-04 LAB — GLUCOSE, CAPILLARY
Glucose-Capillary: 103 mg/dL — ABNORMAL HIGH (ref 70–99)
Glucose-Capillary: 139 mg/dL — ABNORMAL HIGH (ref 70–99)
Glucose-Capillary: 151 mg/dL — ABNORMAL HIGH (ref 70–99)
Glucose-Capillary: 154 mg/dL — ABNORMAL HIGH (ref 70–99)
Glucose-Capillary: 259 mg/dL — ABNORMAL HIGH (ref 70–99)
Glucose-Capillary: 87 mg/dL (ref 70–99)
Glucose-Capillary: 93 mg/dL (ref 70–99)

## 2020-04-04 NOTE — Progress Notes (Signed)
Patient ID: Veronica Horton, female   DOB: 21-Apr-1961, 59 y.o.   MRN: [id number]  PROGRESS NOTE    Veronica Horton  IDP:[id number] DOB: Nov 03, 1960 DOA: 03/10/2020 PCP: [facility]   Brief Narrative:  59 year old female with history of morbid obesity, hiatal hernia, hypertension, COPD, chronic respiratory failure on home oxygen at 5 L/min, smoker presented with worsening shortness of breath and altered mental status.  On presentation, ABG showed PCO2 of 92 requiring BiPAP. She was found to have laryngeal mass concerning for malignancy and she underwent biopsy which showed invasive squamous cell carcinoma and she required tracheostomy. She had to be transferred to ICU post biopsy and had to be kept on ventilator support. She was transferred to [facility] on 11/17 for radiation therapy. She was transferred to [facility] service from [facility] service on 03/20/2020. UnderwentPEG placementby IRon 03/22/2020. She started concurrent chemo radiationon 11/29.  Currently working on discharge planning.  Therapy recommending home health with 24 hr supervision/assistance.  CM currently working on [facility], some difficulty with finding accepting agency.   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 Found to have airway obstruction/compromise due to laryngeal mass. CT soft tissue neck on 11/11 showed bulky bilateral supraglottic tumor with possible airway compromise, involvement of right laryngeal cartilages. ENT was consulted. She underwent laryngeal biopsy which showed squamous cell carcinoma.  -Status post tracheostomy -UnderwentPEG placement by IR on 11/22. -CT chest/abdomen/pelvis did not show signs of metastatic disease. -She was recommended total laryngectomy with bilateral neck dissection but she declined surgery.  -Oncology/radiation oncologyfollowing.  Per oncology, started chemoradiation 11/29.   Next chemotherapy on Monday as per oncology. -Diet as per SLP recommendations.  Continue tube feeding as per dietary -palliative care evaluation for goals of care discussion is pending.  Acute on chronic hypoxic/hypercarbic respiratory failure: History of COPD, on oxygen at home at 5 L/min. Chronic respiratory failure exacerbated by laryngeal squamous cell carcinoma, COPD exacerbation, pneumonia.  -Hospital course was remarkable for Serratia pneumonia. Finished the course of antibiotics with levofloxacin.  -PCCM following: Size 6 cuffed Shiley removed and size 6 uncuffed Shiley placed by PCCM on 04/01/2020.  Currently still requiring 10 L oxygen via trach -Chest x-ray from 04/03/2020 showed persistent bibasilar opacities with slightly improved aeration.  COPD exacerbation:  -Continue pulmonary hygiene, ventilator support at night, trach collar during the day. Follow intermittent chest x-ray. Aspiration precautions.Steroids have been tapered off. No wheezing appreciated. -Continue nebs  Hypoglycemia: -Continue tube feeds and increase oral intake as tolerated  GERD: Continue PPI  Hypertension:Monitor blood pressure. Off antihypertensives which she was at home. Blood pressure okay.  AKI: Resolved  Thrombocytopenia: resolved.  Disposition:therapy now recommending HH and 24 hr supervision/assistance, CM working on home health, but apparently no accepting agency.  Will need to ensure she's able to manage trach and o2 needs at home as well.     DVT prophylaxis:lovenox Code Status: Full Family Communication:  None at bedside Disposition Plan: Status is: Inpatient  Remains inpatient appropriate because:Inpatient level of care appropriate due to severity of illness   Dispo: The patient is from: Home              Anticipated d/c is to: Home              Anticipated d/c date is: > 3 days              Patient currently is medically stable  to d/c.  If appropriate home  health agency can be found   Consultants: PCCM/ENT/IR/oncology/radiation oncology  Procedures:  Intubation/extubation Tracheostomy PEG tube placement  Echo IMPRESSIONS    1. Left ventricular ejection fraction, by estimation, is 70 to 75%. The  left ventricle has hyperdynamic function. The left ventricle has no  regional wall motion abnormalities. There is mild left ventricular  hypertrophy. Left ventricular diastolic  parameters were normal.  2. Right ventricular systolic function is normal. The right ventricular  size is normal. Tricuspid regurgitation signal is inadequate for assessing  PA pressure.  3. The mitral valve is grossly normal. Trivial mitral valve  regurgitation.  4. The aortic valve is tricuspid. Aortic valve regurgitation is not  visualized.  5. The inferior vena cava is normal in size with greater than 50%  respiratory variability, suggesting right atrial pressure of 3 mmHg.   Antimicrobials:  Anti-infectives (From admission, onward)   Start     Dose/Rate Route Frequency Ordered Stop   03/22/20 1700  vancomycin (VANCOCIN) IVPB 1000 mg/200 mL premix  Status:  Discontinued        1,000 mg 200 mL/hr over 60 Minutes Intravenous  Once 03/22/20 1605 03/23/20 0236   03/22/20 1604  vancomycin (VANCOCIN) 1-5 GM/200ML-% IVPB       Note to Pharmacy: [name]   : cabinet override      03/22/20 1604 03/22/20 1615   03/14/20 1100  levofloxacin (LEVAQUIN) IVPB 750 mg        750 mg 100 mL/hr over 90 Minutes Intravenous Daily 03/14/20 0955 03/19/20 1146   03/10/20 1730  doxycycline (VIBRAMYCIN) 100 mg in sodium chloride 0.9 % 250 mL IVPB  Status:  Discontinued        100 mg 125 mL/hr over 120 Minutes Intravenous Every 12 hours 03/10/20 1634 03/14/20 0955   03/10/20 1600  azithromycin (ZITHROMAX) 500 mg in sodium chloride 0.9 % 250 mL IVPB  Status:  Discontinued        500 mg 250 mL/hr over 60 Minutes Intravenous Every 24 hours 03/10/20 1529 03/10/20 1538    03/10/20 1500  clindamycin (CLEOCIN) IVPB 600 mg  Status:  Discontinued        600 mg 100 mL/hr over 30 Minutes Intravenous  Once 03/10/20 1458 03/10/20 1521       Subjective: Patient seen and examined at bedside.  Nods her head to some questions.  Poor historian.  Complains of cough and more shortness of breath.  No overnight fever or vomiting reported objective: Vitals:   04/03/20 2335 04/04/20 0500 04/04/20 0514 04/04/20 0742  BP:   (!) 103/54   Pulse: 74  66   Resp:   16   Temp:   99.5 F (37.5 C)   TempSrc:   Oral   SpO2: 93%  94% (!) 89%  Weight:  118.5 kg    Height:        Intake/Output Summary (Last 24 hours) at 04/04/2020 4540 Last data filed at 04/04/2020 0220 Gross per 24 hour  Intake 1194 ml  Output 1850 ml  Net -656 ml   Filed Weights   04/01/20 1700 04/03/20 0340 04/04/20 0500  Weight: 115.8 kg 123.5 kg 118.5 kg    Examination:  General exam: No distress.  Chronically ill looking female lying in bed.  Currently on 10 L oxygen via tracheostomy  ENT: Trach present respiratory system: Bilateral reviewed breath sounds at bases with scattered crackles, no wheezing  cardiovascular system: Rate controlled, S1-S2 heard  gastrointestinal system: Abdomen is morbidly obese, nondistended, soft and nontender.  Normal bowel sounds are heard.  PEG tube is present.   Extremities: No cyanosis.  Mild lower extremity edema present Central nervous system: Poor historian.  Nods her head to some questions.  No focal neurological deficits.  Moving extremities Skin: No obvious ecchymosis/rashes psychiatry: Flat affect  Data Reviewed: I have personally reviewed following labs and imaging studies  CBC: Recent Labs  Lab 03/29/20 0515 03/30/20 0819 03/31/20 0611 04/02/20 1446  WBC 7.0 9.8 8.6 5.5  NEUTROABS 4.8 9.1* 6.8 4.2  HGB 12.1 13.3 12.5 12.0  HCT 37.7 41.1 39.2 37.6  MCV 99.0 97.9 99.0 99.5  PLT 149* 169 170 440*   Basic Metabolic Panel: Recent Labs  Lab  03/29/20 0515 03/30/20 0819 03/31/20 0611 04/02/20 1446  NA 138 138 135 135  K 4.4 4.9 4.8 4.1  CL 103 103 99 99  CO2 27 27 29 28   GLUCOSE 130* 124* 94 101*  BUN 19 26* 34* 20  CREATININE 0.97 0.90 1.19* 0.99  CALCIUM 8.4* 8.9 8.6* 8.5*  MG 2.3 2.7* 2.4  --   PHOS 4.4 4.1 5.4*  --    GFR: Estimated Creatinine Clearance: 77.8 mL/min (by C-G formula based on SCr of 0.99 mg/dL). Liver Function Tests: Recent Labs  Lab 03/29/20 0515 03/30/20 0819 03/31/20 0611 04/02/20 1446  AST 14* 17 19 15   ALT 15 16 22 18   ALKPHOS 53 56 52 61  BILITOT 0.7 0.8 0.5 0.6  PROT 5.6* 6.4* 5.8* 5.9*  ALBUMIN 2.7* 2.9* 2.9* 3.0*   No results for input(s): LIPASE, AMYLASE in the last 168 hours. No results for input(s): AMMONIA in the last 168 hours. Coagulation Profile: No results for input(s): INR, PROTIME in the last 168 hours. Cardiac Enzymes: No results for input(s): CKTOTAL, CKMB, CKMBINDEX, TROPONINI in the last 168 hours. BNP (last 3 results) No results for input(s): PROBNP in the last 8760 hours. HbA1C: No results for input(s): HGBA1C in the last 72 hours. CBG: Recent Labs  Lab 04/03/20 1656 04/03/20 2006 04/04/20 0013 04/04/20 0445 04/04/20 0740  GLUCAP 101* 146* 154* 87 259*   Lipid Profile: No results for input(s): CHOL, HDL, LDLCALC, TRIG, CHOLHDL, LDLDIRECT in the last 72 hours. Thyroid Function Tests: No results for input(s): TSH, T4TOTAL, FREET4, T3FREE, THYROIDAB in the last 72 hours. Anemia Panel: No results for input(s): VITAMINB12, FOLATE, FERRITIN, TIBC, IRON, RETICCTPCT in the last 72 hours. Sepsis Labs: No results for input(s): PROCALCITON, LATICACIDVEN in the last 168 hours.  Recent Results (from the past 240 hour(s))  Culture, respiratory (non-expectorated)     Status: None (Preliminary result)   Collection Time: 04/02/20  3:36 PM   Specimen: Tracheal Aspirate; Respiratory  Result Value Ref Range Status   Specimen Description   Final    TRACHEAL ASPIRATE  Performed at [facility] [address] [place], [place] [address]    Special Requests   Final    NONE Performed at [facility], [place] [address] [place], [place] [address]    Gram Stain   Final    RARE WBC PRESENT, PREDOMINANTLY PMN RARE GRAM POSITIVE COCCI RARE GRAM NEGATIVE RODS    Culture   Final    FEW GRAM NEGATIVE RODS CULTURE REINCUBATED FOR BETTER GROWTH Performed at [facility], [place] [address], [place], [place] [address]    Report Status PENDING  Incomplete         Radiology Studies:  DG CHEST PORT 1 VIEW  Result Date: 04/03/2020 CLINICAL DATA:  Increasing shortness of breath EXAM: PORTABLE CHEST 1 VIEW COMPARISON:  03/31/2020 FINDINGS: Tracheostomy tube is noted in satisfactory position. Cardiac shadow is enlarged but stable. Some persistent bibasilar opacities are noted although mildly improved when compared with the prior exam. No new focal abnormality is noted. IMPRESSION: Improved aeration as described. Electronically Signed   By: Inez Catalina M.D.   On: 04/03/2020 11:59        Scheduled Meds: . arformoterol  15 mcg Nebulization BID  . budesonide (PULMICORT) nebulizer solution  0.5 mg Nebulization BID  . chlorhexidine  15 mL Mouth Rinse BID  . Chlorhexidine Gluconate Cloth  6 each Topical Daily  . docusate  100 mg Per Tube BID  . enoxaparin (LOVENOX) injection  60 mg Subcutaneous Daily  . feeding supplement  237 mL Oral TID BM  . feeding supplement (OSMOLITE 1.5 CAL)  237 mL Per Tube TID BM  . gabapentin  600 mg Oral QHS  . insulin aspart  0-9 Units Subcutaneous Q4H  . ipratropium-albuterol  3 mL Nebulization TID  . ketotifen  1 drop Both Eyes BID  . lidocaine  1 patch Transdermal Q24H  . mouth rinse  15 mL Mouth Rinse q12n4p  . melatonin  5 mg Oral QHS  . multivitamin with minerals  1 tablet Per Tube Daily  . nicotine  21 mg Transdermal Daily  . pantoprazole sodium  40 mg Per Tube Daily  . scopolamine   1 patch Transdermal Q72H   Continuous Infusions: . sodium chloride            Aline August, MD [facility] 04/04/2020, 8:22 AM

## 2020-04-05 ENCOUNTER — Ambulatory Visit
Admit: 2020-04-05 | Discharge: 2020-04-05 | Disposition: A | Discharge status: Home / Self Care | Payer: Medicaid Other | Attending: Radiation Oncology | Admitting: Radiation Oncology

## 2020-04-05 LAB — BASIC METABOLIC PANEL
Anion gap: 9 (ref 5–15)
BUN: 14 mg/dL (ref 6–20)
CO2: 29 mmol/L (ref 22–32)
Calcium: 8.5 mg/dL — ABNORMAL LOW (ref 8.9–10.3)
Chloride: 99 mmol/L (ref 98–111)
Creatinine, Ser: 1 mg/dL (ref 0.44–1.00)
GFR, Estimated: >60 mL/min (ref >60)
Glucose, Bld: 89 mg/dL (ref 70–99)
Potassium: 4.4 mmol/L (ref 3.5–5.1)
Sodium: 137 mmol/L (ref 135–145)

## 2020-04-05 LAB — CBC WITH DIFFERENTIAL/PLATELET
Abs Immature Granulocytes: 0.04 10*3/uL (ref 0.00–0.07)
Basophils Absolute: 0 10*3/uL (ref 0.0–0.1)
Basophils Relative: 1 %
Eosinophils Absolute: 0.1 10*3/uL (ref 0.0–0.5)
Eosinophils Relative: 1 %
HCT: 37.5 % (ref 36.0–46.0)
Hemoglobin: 12 g/dL (ref 12.0–15.0)
Immature Granulocytes: 1 %
Lymphocytes Relative: 17 %
Lymphs Abs: 1 10*3/uL (ref 0.7–4.0)
MCH: 31.6 pg (ref 26.0–34.0)
MCHC: 32 g/dL (ref 30.0–36.0)
MCV: 98.7 fL (ref 80.0–100.0)
Monocytes Absolute: 0.6 10*3/uL (ref 0.1–1.0)
Monocytes Relative: 11 %
Neutro Abs: 4.1 10*3/uL (ref 1.7–7.7)
Neutrophils Relative %: 69 %
Platelets: 171 10*3/uL (ref 150–400)
RBC: 3.8 MIL/uL — ABNORMAL LOW (ref 3.87–5.11)
RDW: 14.3 % (ref 11.5–15.5)
WBC: 5.8 10*3/uL (ref 4.0–10.5)
nRBC: 0 % (ref 0.0–0.2)

## 2020-04-05 LAB — GLUCOSE, CAPILLARY
Glucose-Capillary: 87 mg/dL (ref 70–99)
Glucose-Capillary: 88 mg/dL (ref 70–99)
Glucose-Capillary: 113 mg/dL — ABNORMAL HIGH (ref 70–99)
Glucose-Capillary: 128 mg/dL — ABNORMAL HIGH (ref 70–99)
Glucose-Capillary: 237 mg/dL — ABNORMAL HIGH (ref 70–99)

## 2020-04-05 LAB — CULTURE, RESPIRATORY W GRAM STAIN

## 2020-04-05 LAB — MAGNESIUM: Magnesium: 2.1 mg/dL (ref 1.7–2.4)

## 2020-04-05 MED ORDER — SODIUM CHLORIDE 0.9 % IV SOLN
150.0000 mg | Freq: Once | INTRAVENOUS | Status: AC
  Administered 2020-04-05: 150 mg via INTRAVENOUS
  Filled 2020-04-05: qty 5

## 2020-04-05 MED ORDER — SODIUM CHLORIDE 0.9 % IV SOLN
Freq: Once | INTRAVENOUS | Status: AC
  Filled 2020-04-05: qty 10

## 2020-04-05 MED ORDER — PALONOSETRON HCL INJECTION 0.25 MG/5ML
0.2500 mg | Freq: Once | INTRAVENOUS | Status: AC
  Administered 2020-04-05: 0.25 mg via INTRAVENOUS
  Filled 2020-04-05: qty 5

## 2020-04-05 MED ORDER — SODIUM CHLORIDE 0.9 % IV SOLN
10.0000 mg | Freq: Once | INTRAVENOUS | Status: AC
  Administered 2020-04-05: 10 mg via INTRAVENOUS
  Filled 2020-04-05: qty 1

## 2020-04-05 MED ORDER — SODIUM CHLORIDE 0.9 % IV SOLN
40.0000 mg/m2 | Freq: Once | INTRAVENOUS | Status: AC
  Administered 2020-04-05: 91 mg via INTRAVENOUS
  Filled 2020-04-05: qty 91

## 2020-04-05 MED ORDER — SODIUM CHLORIDE 0.9 % IV SOLN
Freq: Once | INTRAVENOUS | Status: DC
  Filled 2020-04-05: qty 1000

## 2020-04-05 MED ORDER — SODIUM CHLORIDE 0.9 % IV SOLN: Freq: Once | INTRAVENOUS | Status: AC

## 2020-04-05 NOTE — Progress Notes (Addendum)
HEMATOLOGY-ONCOLOGY PROGRESS NOTE  SUBJECTIVE:  The patient is feeling well overall this morning.  Denies pain.  Respiratory status is stable.  Denies nausea and vomiting.  No bowel changes.  No difficulty urinating.  Still has thick blood-tinged mucus from trach.  Using guaifenesin as needed to thin secretions.  No fevers or chills.  Rest of the pertinent 10 point ROS reviewed and negative  I have reviewed the past medical history, past surgical history, social history and family history with the patient and they are unchanged from previous note.   PHYSICAL EXAMINATION: ECOG PERFORMANCE STATUS: 2 - Symptomatic, <50% confined to bed  Vitals:   04/05/20 0746 04/05/20 0837  BP:    Pulse:    Resp:    Temp:    SpO2: 92% 92%   Filed Weights   04/03/20 0340 04/04/20 0500 04/05/20 0500  Weight: 123.5 kg 118.5 kg 116.7 kg    Intake/Output from previous day: 12/05 0701 - 12/06 0700 In: 735 [P.O.:870] Out: 700 [Urine:700]  GENERAL: alert, no distress and comfortable, on trach collar Ant lung fields clear. Trach site with clear mucus Abdomen. Non tender non distended, PEG site clean. BLE venous stasis , no change.  LABORATORY DATA:  I have reviewed the data as listed CMP Latest Ref Rng & Units 04/05/2020 04/02/2020 03/31/2020  Glucose 70 - 99 mg/dL 89 101(H) 94  BUN 6 - 20 mg/dL 14 20 34(H)  Creatinine 0.44 - 1.00 mg/dL 1.00 0.99 1.19(H)  Sodium 135 - 145 mmol/L 137 135 135  Potassium 3.5 - 5.1 mmol/L 4.4 4.1 4.8  Chloride 98 - 111 mmol/L 99 99 99  CO2 22 - 32 mmol/L 29 28 29   Calcium 8.9 - 10.3 mg/dL 8.5(L) 8.5(L) 8.6(L)  Total Protein 6.5 - 8.1 g/dL - 5.9(L) 5.8(L)  Total Bilirubin 0.3 - 1.2 mg/dL - 0.6 0.5  Alkaline Phos 38 - 126 U/L - 61 52  AST 15 - 41 U/L - 15 19  ALT 0 - 44 U/L - 18 22    Lab Results  Component Value Date   WBC 5.8 04/05/2020   HGB 12.0 04/05/2020   HCT 37.5 04/05/2020   MCV 98.7 04/05/2020   PLT 171 04/05/2020   NEUTROABS 4.1 04/05/2020    CT  SOFT TISSUE NECK W CONTRAST  Addendum Date: 03/11/2020   ADDENDUM REPORT: 03/11/2020 16:36 ADDENDUM: Study discussed by telephone with Dr. Jolaine Artist MEMON on 03/11/2020 at 1631 hours. Electronically Signed   By: Genevie Ann M.D.   On: 03/11/2020 16:36   Result Date: 03/11/2020 CLINICAL DATA:  59 year old female with dysphagia. Tonsillitis suspected. EXAM: CT NECK WITH CONTRAST TECHNIQUE: Multidetector CT imaging of the neck was performed using the standard protocol following the bolus administration of intravenous contrast. CONTRAST:  78mL OMNIPAQUE IOHEXOL 300 MG/ML  SOLN COMPARISON:  Chest CT 01/28/2019. FINDINGS: Pharynx and larynx: Bulky soft tissue mass throughout the bilateral supraglottic larynx and affecting the hypopharynx. Lobulated diffuse thickening of the epiglottis (series 2, image 53). Diffuse false cord and anterior commissure involvement with evidence of extension into the right paraglottic space on series 2 image 61 - where nodular direct extension of tumor and/or inseparable abnormal right level 3 lymph node protrudes on coronal image 58. Asymmetric erosion of the undersurface of the right thyroid cartilage on image 65. Asymmetric sclerosis of the right arytenoid on image 67., and midline extension of tumor is suspected through the anterior commissure a distance of about 11 mm as seen on series 2, image 67  and sagittal image 48. All told, tumor size is estimated at 37 x 52 by 45 mm (AP by transverse by CC). There is evidence of airway compromise. The true cords may be spared as seen on series 2, image 71. Subglottic larynx is within normal limits. Above the vallecula pharyngeal contours appear normal. Normal superior parapharyngeal and retropharyngeal spaces. Salivary glands: Negative sublingual space. Submandibular glands and parotid glands remain within normal limits. Thyroid: Negative. Lymph nodes: Malignant left level IIIb lymph node measures 17 mm short axis and 29 mm long axis (series 2,  image 55 and coronal image 73). As stated above it is possible of malignant right level IIIa lymph node is inseparable from the parent tumor along the right paraglottic space (coronal image 58). Smaller but asymmetrically enlarged left level 4 nodes measure up to 9 mm short axis (coronal image 72). No other abnormal or suspicious lymph nodes identified. Vascular: Major vascular structures in the neck and at the skull base remain patent including both internal jugular veins. The left vertebral artery appears dominant. Calcified atherosclerosis at the skull base. Limited intracranial: Negative. Visualized orbits: Negative. Mastoids and visualized paranasal sinuses: Clear. Skeleton: Absent and carious dentition. Cervical spine degeneration. No suspicious osseous lesion identified. 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 No superior mediastinal lymphadenopathy. Visible axillary lymph nodes are normal. Mild dependent atelectasis.  No upper lung nodule identified. IMPRESSION: 1. 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 Recommend ENT consultation. Involvement of right laryngeal cartilages, with extension in the midline anterior to the strap muscles, and also extension through the right paraglottic space and/or inseparable malignant right level IIIa lymph node. Estimated tumor long axis 5.2 cm. 2. Malignant contralateral left level 3b lymph node is 2.9 cm long axis. Smaller indeterminate left level 4 lymph nodes. 3. No distant metastatic disease identified in the neck or upper chest. Electronically Signed: By: Genevie Ann M.D. On: 03/11/2020 16:23   IR GASTROSTOMY TUBE MOD SED  Result Date: 03/22/2020 INDICATION: Head and neck malignancy, dysphagia EXAM: FLUOROSCOPIC 16 FRENCH BALLOON RETENTION GASTROSTOMY MEDICATIONS: 1 G VANCOMYCIN; Antibiotics were administered within 1 hour of the procedure. GLUCAGON 0.5 MG IV ANESTHESIA/SEDATION: Versed 0 mg IV; Fentanyl 50 mcg  IV Moderate Sedation Time:  12 MINUTES The patient was continuously monitored during the procedure by the interventional radiology nurse under my direct supervision. CONTRAST:  10 CC-administered into the gastric lumen. FLUOROSCOPY TIME:  Fluoroscopy Time: 1 minutes 36 seconds (53 mGy). COMPLICATIONS: None immediate. PROCEDURE: Informed written consent was obtained from the patient after a thorough discussion of the procedural risks, benefits and alternatives. All questions were addressed. Maximal Sterile Barrier Technique was utilized including caps, mask, sterile gowns, sterile gloves, sterile drape, hand hygiene and skin antiseptic. A timeout was performed prior to the initiation of the procedure. Previous imaging reviewed. Patient has an existing feeding tube within the stomach. This was utilized to insufflate the stomach with air. The stomach was localized beneath the left subcostal margin in the left abdomen with biplane fluoroscopy. Overlying skin marked. Under sterile conditions and local anesthesia, introducer needle was advanced into the stomach. Contrast injection confirms percutaneous needle access within the stomach. Single T tack deployed for gastropexy. Amplatz guidewire inserted. Tract dilatation performed to insert the 16 French balloon retention gastrostomy. Balloon tip inflated with 7 cc mixture of saline and 1 cc contrast. This was retracted against the anterior gastric wall. Contrast injection confirms position in the stomach. Images obtained for documentation. T tacks secured  to the skin site. A sterile dressing applied. No immediate complication. Patient tolerated the procedure well. IMPRESSION: Successful fluoroscopic 38 French balloon retention gastrostomy Electronically Signed   By: Jerilynn Mages.  Shick M.D.   On: 03/22/2020 16:53   CT CHEST ABDOMEN PELVIS W CONTRAST  Result Date: 03/15/2020 CLINICAL DATA:  New diagnosis of head neck mass EXAM: CT CHEST, ABDOMEN, AND PELVIS WITH CONTRAST  TECHNIQUE: Multidetector CT imaging of the chest, abdomen and pelvis was performed following the standard protocol during bolus administration of intravenous contrast. CONTRAST:  141mL OMNIPAQUE IOHEXOL 300 MG/ML  SOLN COMPARISON:  Prior swallowing evaluation and CT of the neck. FINDINGS: CT CHEST FINDINGS Cardiovascular: Calcified coronary artery disease. Aorta is normal caliber. 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 Central pulmonary vasculature is mildly engorged. Approximately 3 cm greatest caliber unchanged from previous exam. Unremarkable on limited venous phase assessment. Mediastinum/Nodes: Supraglottic mass partially imaged on the first image of the data set. Interval placement of tracheostomy tube with resultant pneumomediastinum in the anterior mediastinum in upper mediastinum. No adenopathy within the mediastinum or axilla. No hilar adenopathy. Rounded partial necrotic lymph node in the LEFT neck partially visualized, corresponding to level IIIb lymph node seen on the neck CT. Lungs/Pleura: Basilar consolidative changes bilaterally with similar pattern of consolidation though decreased volume loss when compared to the study of September of 2020. Airways are patent. Musculoskeletal: See below for full musculoskeletal detail. CT ABDOMEN PELVIS FINDINGS Hepatobiliary: Liver without focal lesion. Post cholecystectomy. No biliary duct dilation. Pancreas: Mild atrophy of the pancreas without focal lesion, ductal dilation or inflammation. Spleen: Spleen normal in size and contour. Adrenals/Urinary Tract: Adrenal glands are normal. Symmetric renal enhancement. No hydronephrosis. LEFT renal cysts, largest arising from the lower pole measuring 2.8 x 2.9 cm. Urinary bladder under distended limiting assessment. Stomach/Bowel: Gastrointestinal tract with signs of colonic diverticulosis. No acute bowel process. Rectus diastasis. Postoperative changes in the midline of the  abdomen related to prior ventral hernia repair. Mild bulging at the site of hernia repair, no herniation beyond the inserted mesh near the umbilicus. Vascular/Lymphatic: Calcified atheromatous plaque in the abdominal aorta. No aneurysmal dilation. There is no gastrohepatic or hepatoduodenal ligament lymphadenopathy. No retroperitoneal or mesenteric lymphadenopathy. No pelvic sidewall lymphadenopathy. Reproductive: No adnexal mass. Reproductive structures are unremarkable. Other: Post abdominal wall reconstruction with rectus diastasis, no frank hernia. Musculoskeletal: Spinal degenerative changes. Degenerative changes in glenohumeral joints and hips. Ununited fractures along posterior LEFT chest involving ribs 10 through 12. These appear subacute or chronic. These did not appear to be present on previous imaging from September of 2020. IMPRESSION: 1. Supraglottic mass partially imaged on the first image of the data set. No signs of metastatic disease to the chest, abdomen or pelvis. 2. Rounded partially necrotic lymph node in the LEFT neck partially visualized, corresponding to level IIIb lymph node seen on the neck CT. 3. Basilar consolidative changes bilaterally with similar pattern of consolidation though decreased volume loss when compared to the study of September of 2020. Findings may be related to aspiration. No definite lesions seen in the chest though these areas could obscure underlying lesions. 4. Postoperative changes with small amount of pneumomediastinum presumably related to recent tracheostomy tube insertion. 5. Ununited fractures along posterior LEFT chest involving ribs 10 through 12. These appear subacute or chronic. These did not appear to be present on previous imaging from September of 2020. 6. Post abdominal wall reconstruction with rectus diastasis, no frank hernia. 7. Aortic atherosclerosis. Aortic Atherosclerosis (  ICD10-I70.0). Electronically Signed   By: Zetta Bills M.D.   On:  03/15/2020 10:53   DG CHEST PORT 1 VIEW  Result Date: 04/03/2020 CLINICAL DATA:  Increasing shortness of breath EXAM: PORTABLE CHEST 1 VIEW COMPARISON:  03/31/2020 FINDINGS: Tracheostomy tube is noted in satisfactory position. Cardiac shadow is enlarged but stable. Some persistent bibasilar opacities are noted although mildly improved when compared with the prior exam. No new focal abnormality is noted. IMPRESSION: Improved aeration as described. Electronically Signed   By: Inez Catalina M.D.   On: 04/03/2020 11:59   DG CHEST PORT 1 VIEW  Result Date: 03/31/2020 CLINICAL DATA:  Pleural effusion EXAM: PORTABLE CHEST 1 VIEW COMPARISON:  03/29/2020 FINDINGS: Tracheostomy unchanged. Lung volumes are small, however, pulmonary insufflation remains stable since prior examination. Bilateral pleural effusions are present small and decreased in size since prior examination. Ovoid opacity within the right mid lung zone likely represents fluid within the fissure. There is bibasilar atelectasis. Cardiac size is within normal limits. No acute bone abnormality. IMPRESSION: Slight interval decrease in small bilateral pleural effusions with associated bibasilar atelectasis. Pulmonary hypoinflation. Electronically Signed   By: Fidela Salisbury MD   On: 03/31/2020 05:41   DG CHEST PORT 1 VIEW  Result Date: 03/29/2020 CLINICAL DATA:  Dyspnea EXAM: PORTABLE CHEST 1 VIEW COMPARISON:  03/25/2020 FINDINGS: Tracheostomy unchanged. Pulmonary insufflation is symmetric though has decreased slightly since prior examination. Small bilateral pleural effusions have developed with associated bibasilar atelectasis. No pneumothorax. Cardiac size within normal limits. Pulmonary vascularity is normal. No acute bone abnormality. IMPRESSION: Interval development of small bilateral pleural effusions with bibasilar compressive atelectasis. Electronically Signed   By: Fidela Salisbury MD   On: 03/29/2020 06:00   DG CHEST PORT 1 VIEW  Result  Date: 03/25/2020 CLINICAL DATA:  Hypoxia, shortness of breath, altered mental status history COPD, hypertension, history laryngeal carcinoma, tracheostomy, undergoing radiation therapy EXAM: PORTABLE CHEST 1 VIEW COMPARISON:  Portable exam 6387 hours compared to 03/23/2020 FINDINGS: Tracheostomy tube projects over tracheal air column. Normal heart size, mediastinal contours, and pulmonary vascularity. Improving opacity at LEFT lung base which could represent atelectasis or consolidation. Subsegmental atelectasis RIGHT middle lobe slightly improved. Tiny LEFT pleural effusion. Remaining lungs clear. No pneumothorax. Bones demineralized. IMPRESSION: Persistent subsegmental atelectasis at RIGHT base with improving atelectasis versus consolidation and tiny pleural effusion at LEFT base. Electronically Signed   By: Lavonia Dana M.D.   On: 03/25/2020 10:33   DG CHEST PORT 1 VIEW  Result Date: 03/23/2020 CLINICAL DATA:  Hypoxia. EXAM: PORTABLE CHEST 1 VIEW COMPARISON:  Left 19 2021. FINDINGS: Interim removal of feeding tube. Tracheostomy tube in stable position. Cardiomegaly. Persistent bibasilar atelectasis/infiltrates and small bilateral pleural effusions. Density noted over the right mid chest may represent atelectasis and or pleural fluid pseudotumor, no interim change. IMPRESSION: 1. Interim removal of feeding tube. Tracheostomy tube in stable position. 2. Stable cardiomegaly. 3. Persistent bibasilar atelectasis/infiltrates and small bilateral pleural effusions. Density of the right mid chest may represent atelectasis and or pleural fluid pseudotumor, no interim change. Electronically Signed   By: Marcello Moores  Register   On: 03/23/2020 06:39   DG CHEST PORT 1 VIEW  Result Date: 03/19/2020 CLINICAL DATA:  Pneumonia. EXAM: PORTABLE CHEST 1 VIEW COMPARISON:  03/18/2020. FINDINGS: Tracheostomy tube, feeding tube in stable position. Stable cardiomegaly. Improved pulmonary venous congestion. Lung volumes. Persistent  left base atelectasis/infiltrate. Persistent right base subsegmental atelectasis. Elliptical density noted over the right mid chest may represent atelectasis and or fissural fluid. Pleural  effusions cannot be excluded. No pneumothorax. IMPRESSION: 1. Lines and tubes in stable position. 2. Stable cardiomegaly. Improved pulmonary venous congestion. 3. Persistent left base atelectasis/infiltrate. Persistent right base subsegmental atelectasis. Elliptical density noted over the right mid chest may represent atelectasis and or fissural fluid. Small pleural effusions cannot be excluded. Electronically Signed   By: Marcello Moores  Register   On: 03/19/2020 06:03   DG Chest Port 1 View  Result Date: 03/18/2020 CLINICAL DATA:  Respiratory failure. EXAM: PORTABLE CHEST 1 VIEW COMPARISON:  03/17/2020. FINDINGS: Tracheostomy tube and feeding tube in stable position. Cardiomegaly. Mild pulmonary venous congestion. Mild bilateral interstitial prominence. Mild component of CHF cannot be excluded. Persistent left lower lobe atelectasis/infiltrate. New onset right mid lung prominent atelectatic changes. No pneumothorax. IMPRESSION: 1. Tracheostomy tube and feeding tube in stable position. 2. Cardiomegaly with mild pulmonary venous congestion and mild bilateral interstitial prominence. Mild component of CHF cannot be excluded. 3. Persistent left lower lobe atelectasis/infiltrate. New onset of right mid lung prominent atelectatic changes. Electronically Signed   By: Marcello Moores  Register   On: 03/18/2020 07:34   DG CHEST PORT 1 VIEW  Result Date: 03/17/2020 CLINICAL DATA:  Respiratory failure EXAM: PORTABLE CHEST 1 VIEW COMPARISON:  March 14, 2020 FINDINGS: Tracheostomy catheter tip is 6.2 cm above the carina. Feeding tube tip is below the diaphragm. No pneumothorax. There is a small left pleural effusion with consolidation in the left lower lobe. There is right base atelectasis with equivocal small right pleural effusion. Heart is  mildly enlarged, stable, with pulmonary vascularity normal. No adenopathy. No bone lesions. IMPRESSION: Tube positions as described without pneumothorax. Left pleural effusion with equivocal right pleural effusion. Airspace opacity consistent with combination of atelectasis and pneumonia left lower lobe. Mild atelectasis right base. Stable cardiac prominence. Electronically Signed   By: Lowella Grip III M.D.   On: 03/17/2020 09:02   DG CHEST PORT 1 VIEW  Result Date: 03/14/2020 CLINICAL DATA:  Respiratory failure with hypercapnia EXAM: PORTABLE CHEST 1 VIEW COMPARISON:  March 10, 2020 FINDINGS: Tracheostomy catheter tip is 6.2 cm above the carina. There is ill-defined airspace opacity in the left upper lobe and left base regions with equivocal left pleural effusion. The right lung is clear. Heart is upper normal in size with pulmonary vascularity normal. No adenopathy. No bone lesions. IMPRESSION: Tracheostomy as described without pneumothorax. Patchy airspace opacity consistent with scattered areas of pneumonia in the left upper lobe and left base. Equivocal left pleural effusion. Right lung clear. Stable cardiac silhouette. Electronically Signed   By: Lowella Grip III M.D.   On: 03/14/2020 09:17   DG Chest Portable 1 View  Result Date: 03/10/2020 CLINICAL DATA:  Shortness of breath and cough EXAM: PORTABLE CHEST 1 VIEW COMPARISON:  January 23, 2020 FINDINGS: There is bibasilar atelectatic change. Elsewhere the interstitium is mildly thickened. No consolidation. Heart size and pulmonary vascularity are normal. No adenopathy. There is degenerative change in each shoulder. IMPRESSION: Bibasilar atelectasis. Interstitial thickening likely represents a degree of underlying chronic bronchitis. No edema or airspace opacity. Heart size within normal limits. Electronically Signed   By: Lowella Grip III M.D.   On: 03/10/2020 12:42   DG Swallowing Func-Speech Pathology  Result Date:  03/27/2020 Objective Swallowing Evaluation: Type of Study: MBS-Modified Barium Swallow Study  Patient Details Name: DEZYRAE KENSINGER MRN: [id number] Date of Birth: Oct 30, 1960 Today's Date: 03/27/2020 Time: SLP Start Time (ACUTE ONLY): 3825 -SLP Stop Time (ACUTE ONLY): 1235 SLP Time Calculation (min) (ACUTE ONLY): 20 min  Past Medical History: Past Medical History: Diagnosis Date . Bronchitis  . Class 3 obesity 01/23/2020 . COPD (chronic obstructive pulmonary disease) ([place])  . Degenerative disc disease, lumbar  . Hiatal hernia  . Hypertension  Past Surgical History: Past Surgical History: Procedure Laterality Date . CHOLECYSTECTOMY   . degenerative bone disease   . DIRECT LARYNGOSCOPY N/A 03/13/2020  Procedure: DIRECT LARYNGOSCOPY WITH BIOPSY;  Surgeon: Jason Coop, DO;  Location: [place];  Service: ENT;  Laterality: N/A; . INCISIONAL HERNIA REPAIR N/A 01/15/2019  Procedure: [name] HERNIORRHAPHY WITH MESH;  Surgeon: [name], MD;  Location: [facility];  Service: General;  Laterality: N/A; . IR GASTROSTOMY TUBE MOD SED  03/22/2020 . OMENTECTOMY N/A 01/15/2019  Procedure: OMENTECTOMY;  Surgeon: [name], MD;  Location: [facility];  Service: General;  Laterality: N/A; . TRACHEOSTOMY TUBE PLACEMENT N/A 03/13/2020  Procedure: AWAKE TRACHEOSTOMY;  Surgeon: Jason Coop, DO;  Location: [facility] [name];  Service: ENT;  Laterality: N/A; HPI: Veronica Horton is a 59 y/o F with history of COPD,chronic respiratory failure on home oxygen at 5 L/min, with large laryngeal mass lesion, now s/p tracheostomy with Shiley 6-0 and direct laryngoscopy and biopsy. 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  Pt is also s/p PEG for nutrition.  Plans are for chemo and radiation concurrently starting while in hospital.  Prior medical history includes smoking and some dyspnea if walking long distanced.  Subjective: pt awake in chair Assessment / Plan / Recommendation CHL IP CLINICAL IMPRESSIONS 03/27/2020 Clinical  Impression Pt continues with with mild pharyngeal phase dysphagia - obstructive due to mass impacting epiglottic deflection.  In addition, she requires extra time to masticate solids due to dentition.  Swallow trigger is timely with minimal laryngeal penetration of liquid into larynx during the swallow due to decreased epiglottic deflection.  Min vallecular residue with solids without pt awareness - liquid wash assisted to clear.  Pt with prominent cricopharyngeus and appears with trace backflow near this region but view was suboptimal.  Recommend D3/mech soft and thin liquids, po medications whole in puree and follow with liquid wash.  Of note, following testing, pt did cough and slight whitish coating noted on secretions but no aspiration noted during testing.  Suspect pt has some low grade chronic secretion aspiration. SLP Visit Diagnosis Dysphagia, oropharyngeal phase (R13.12) Attention and concentration deficit following -- Frontal lobe and executive function deficit following -- Impact on safety and function Mild aspiration risk   CHL IP TREATMENT RECOMMENDATION 03/27/2020 Treatment Recommendations Therapy as outlined in treatment plan below   Prognosis 03/27/2020 Prognosis for Safe Diet Advancement Fair Barriers to Reach Goals Other (Comment) Barriers/Prognosis Comment -- CHL IP DIET RECOMMENDATION 03/27/2020 SLP Diet Recommendations Dysphagia 3 (Mech soft) solids;Thin liquid Liquid Administration via -- Medication Administration Whole meds with puree Compensations Slow rate;Small sips/bites Postural Changes --   CHL IP OTHER RECOMMENDATIONS 03/27/2020 Recommended Consults (No Data) Oral Care Recommendations Oral care BID Other Recommendations Clarify dietary restrictions   CH[facility] IP FOLLOW UP RECOMMENDATIONS 03/27/2020 Follow up Recommendations Skilled Nursing facility   [facility] IP FREQUENCY AND DURATION 03/27/2020 Speech Therapy Frequency (ACUTE ONLY) min 1 x/week Treatment Duration 1 week      CHL IP ORAL PHASE  03/27/2020 Oral Phase WFL Oral - Pudding Teaspoon -- Oral - Pudding Cup -- Oral - Honey Teaspoon -- Oral - Honey Cup -- Oral - Nectar Teaspoon -- Oral - Nectar Cup -- Oral - Nectar Straw -- Oral - Thin Teaspoon -- Oral - Thin Cup --  Oral - Thin Straw -- Oral - Puree -- Oral - Mech Soft -- Oral - Regular -- Oral - Multi-Consistency -- Oral - Pill -- Oral Phase - Comment --  CH[facility] IP PHARYNGEAL PHASE 03/27/2020 Pharyngeal Phase -- Pharyngeal- Pudding Teaspoon -- Pharyngeal -- Pharyngeal- Pudding Cup -- Pharyngeal -- Pharyngeal- Honey Teaspoon -- Pharyngeal -- Pharyngeal- Honey Cup -- Pharyngeal -- Pharyngeal- Nectar Teaspoon -- Pharyngeal -- Pharyngeal- Nectar Cup -- Pharyngeal -- Pharyngeal- Nectar Straw Reduced epiglottic inversion;Pharyngeal residue - valleculae Pharyngeal -- Pharyngeal- Thin Teaspoon Reduced epiglottic inversion;Pharyngeal residue - valleculae Pharyngeal -- Pharyngeal- Thin Cup Pharyngeal residue - valleculae;Reduced epiglottic inversion;Penetration/Aspiration during swallow Pharyngeal Material enters airway, remains ABOVE vocal cords then ejected out Pharyngeal- Thin Straw Reduced epiglottic inversion;Pharyngeal residue - valleculae;Penetration/Aspiration during swallow Pharyngeal Material enters airway, remains ABOVE vocal cords then ejected out Pharyngeal- Puree WFL Pharyngeal -- Pharyngeal- Mechanical Soft -- Pharyngeal -- Pharyngeal- Regular Reduced epiglottic inversion Pharyngeal -- Pharyngeal- Multi-consistency -- Pharyngeal -- Pharyngeal- Pill NT Pharyngeal -- Pharyngeal Comment --  CHL IP CERVICAL ESOPHAGEAL PHASE 03/27/2020 Cervical Esophageal Phase -- Pudding Teaspoon -- Pudding Cup -- Honey Teaspoon -- Honey Cup -- Nectar Teaspoon -- Nectar Cup -- Nectar Straw -- Thin Teaspoon -- Thin Cup Prominent cricopharyngeal segment Thin Straw -- Puree -- Mechanical Soft -- Regular -- Multi-consistency -- Pill -- Cervical Esophageal Comment appearance of potential minimal backflow of liquid near  UES region = suboptimal view - did not backflow into pharynx/larynx [name], MS [facility] SLP Acute Rehab Services Office [phone] Pager [phone] [name] 03/27/2020, 2:31 PM              DG Swallowing Func-Speech Pathology  Result Date: 03/11/2020 Objective Swallowing Evaluation: Type of Study: MBS-Modified Barium Swallow Study  Patient Details Name: Veronica Horton MRN: [id number] Date of Birth: 1960/06/07 Today's Date: 03/11/2020 Time: SLP Start Time (ACUTE ONLY): 9833 -SLP Stop Time (ACUTE ONLY): 1316 SLP Time Calculation (min) (ACUTE ONLY): 21 min Past Medical History: Past Medical History: Diagnosis Date . Bronchitis  . Class 3 obesity 01/23/2020 . COPD (chronic obstructive pulmonary disease) ([place])  . Degenerative disc disease, lumbar  . Hiatal hernia  . Hypertension  Past Surgical History: Past Surgical History: Procedure Laterality Date . CHOLECYSTECTOMY   . degenerative bone disease   . INCISIONAL HERNIA REPAIR N/A 01/15/2019  Procedure: [name] HERNIORRHAPHY WITH MESH;  Surgeon: [name], MD;  Location: [facility];  Service: General;  Laterality: N/A; . OMENTECTOMY N/A 01/15/2019  Procedure: OMENTECTOMY;  Surgeon: [name], MD;  Location: [facility];  Service: General;  Laterality: N/A; HPI: Veronica Horton  is a 59 y.o. female, with medical history significant of class III obesity, hiatal hernia, hypertension, bronchitis, COPD, chronic respiratory failure on home oxygen at 5 LPM, active smoker of 1-1/2 packs of cigarettes per day , patient presents to ED secondary to shortness of breath and altered mental status, patient with receptive worsening dyspnea over the last 4 days, she is not vaccinated against Covid, she is altered at the time of my examination, history was obtained from boyfriend at bedside, and ED staff, patient still smoking, she still using 5 L of oxygen, he is with increased work of breathing, significantly dyspneic, upon presentation to ED she is altered not provide any  complaints at this point. BSE requested.  Subjective: "I haven't been able to swallow my pills for a couple weeks." Assessment / Plan / Recommendation CHL IP CLINICAL IMPRESSIONS 03/11/2020 Clinical Impression Pt presents with mild pharyngeal phase dysphagia, however  appearance of anatomy appears edematous (epiglottis and aryepiglottic folds). Pt with limited dentition and requires extra time to masticate solids, swallow trigger generally at the level of the valleculae, Pt with blunted appearance of epiglottis with reduced deflection resulting in variable trace, flash penetration of thins during the swallow without aspiration and min vallecular residue with solids and brief stasis of barium tablet in valleculae. Pt with prominent cricopharyngeus. Recommend D3/mech soft and thin liquids, po medications whole in puree and follow with liquid wash or per Pt preference. Also strongly recommend additional imaging (neck CT) and ENT consult pending those results. SLP will follow pending results of imaging. Above to RN and MD.  SLP Visit Diagnosis Dysphagia, oropharyngeal phase (R13.12) Attention and concentration deficit following -- Frontal lobe and executive function deficit following -- Impact on safety and function Mild aspiration risk   CHL IP TREATMENT RECOMMENDATION 03/11/2020 Treatment Recommendations No treatment recommended at this time   Prognosis 03/11/2020 Prognosis for Safe Diet Advancement Fair Barriers to Reach Goals Severity of deficits Barriers/Prognosis Comment -- CHL IP DIET RECOMMENDATION 03/11/2020 SLP Diet Recommendations Dysphagia 3 (Mech soft) solids;Thin liquid Liquid Administration via Cup;Straw Medication Administration Whole meds with puree Compensations Slow rate;Small sips/bites Postural Changes Remain semi-upright after after feeds/meals (Comment);Seated upright at 90 degrees   CHL IP OTHER RECOMMENDATIONS 03/11/2020 Recommended Consults Consider ENT evaluation Oral Care Recommendations Oral  care BID Other Recommendations Clarify dietary restrictions   CHL IP FOLLOW UP RECOMMENDATIONS 03/11/2020 Follow up Recommendations None   CHL IP FREQUENCY AND DURATION 03/11/2020 Speech Therapy Frequency (ACUTE ONLY) min 2x/week Treatment Duration 1 week      CHL IP ORAL PHASE 03/11/2020 Oral Phase WFL Oral - Pudding Teaspoon -- Oral - Pudding Cup -- Oral - Honey Teaspoon -- Oral - Honey Cup -- Oral - Nectar Teaspoon -- Oral - Nectar Cup -- Oral - Nectar Straw -- Oral - Thin Teaspoon -- Oral - Thin Cup -- Oral - Thin Straw -- Oral - Puree -- Oral - Mech Soft -- Oral - Regular -- Oral - Multi-Consistency -- Oral - Pill -- Oral Phase - Comment --  CHL IP PHARYNGEAL PHASE 03/11/2020 Pharyngeal Phase Impaired Pharyngeal- Pudding Teaspoon -- Pharyngeal -- Pharyngeal- Pudding Cup -- Pharyngeal -- Pharyngeal- Honey Teaspoon -- Pharyngeal -- Pharyngeal- Honey Cup -- Pharyngeal -- Pharyngeal- Nectar Teaspoon -- Pharyngeal -- Pharyngeal- Nectar Cup -- Pharyngeal -- Pharyngeal- Nectar Straw Pharyngeal residue - valleculae;Reduced epiglottic inversion Pharyngeal -- Pharyngeal- Thin Teaspoon Delayed swallow initiation-vallecula;Reduced epiglottic inversion Pharyngeal -- Pharyngeal- Thin Cup Reduced epiglottic inversion;Penetration/Aspiration during swallow Pharyngeal Material enters airway, remains ABOVE vocal cords then ejected out Pharyngeal- Thin Straw Reduced epiglottic inversion;Penetration/Aspiration during swallow Pharyngeal Material enters airway, remains ABOVE vocal cords then ejected out Pharyngeal- Puree WFL Pharyngeal -- Pharyngeal- Mechanical Soft -- Pharyngeal -- Pharyngeal- Regular Pharyngeal residue - valleculae;Reduced epiglottic inversion Pharyngeal -- Pharyngeal- Multi-consistency -- Pharyngeal -- Pharyngeal- Pill Pharyngeal residue - valleculae;Reduced epiglottic inversion Pharyngeal -- Pharyngeal Comment edematous pharynx  CHL IP CERVICAL ESOPHAGEAL PHASE 03/11/2020 Cervical Esophageal Phase Impaired  Pudding Teaspoon -- Pudding Cup -- Honey Teaspoon -- Honey Cup -- Nectar Teaspoon -- Nectar Cup -- Nectar Straw -- Thin Teaspoon -- Thin Cup Prominent cricopharyngeal segment Thin Straw -- Puree -- Mechanical Soft -- Regular -- Multi-consistency -- Pill -- Cervical Esophageal Comment -- Thank you, [name], Easton Avis 03/11/2020, 2:12 PM              ECHOCARDIOGRAM COMPLETE  Result Date: 03/11/2020    ECHOCARDIOGRAM REPORT  Patient Name:   Veronica Horton Date of Exam: 03/11/2020 Medical Rec #:  [id number]       Height:       65.0 in Accession #:    [id number]      Weight:       244.0 lb Date of Birth:  Jul 03, 1960       BSA:          2.153 m Patient Age:    37 years        BP:           115/35 mmHg Patient Gender: F               HR:           57 bpm. Exam Location:  [facility] Procedure: 2D Echo Indications:    Dyspnea 786.09 / R06.00  History:        Patient has prior history of Echocardiogram examinations, most                 recent 01/16/2019. COPD; Risk Factors:Hypertension and Current                 Smoker. Acute respiratory failure with hypoxia.  Sonographer:    Leavy Cella RDCS (AE) Referring Phys: [id number] DAWOOD S [place] IMPRESSIONS  1. Left ventricular ejection fraction, by estimation, is 70 to 75%. The left ventricle has hyperdynamic function. The left ventricle has no regional wall motion abnormalities. There is mild left ventricular hypertrophy. Left ventricular diastolic parameters were normal.  2. Right ventricular systolic function is normal. The right ventricular size is normal. Tricuspid regurgitation signal is inadequate for assessing PA pressure.  3. The mitral valve is grossly normal. Trivial mitral valve regurgitation.  4. The aortic valve is tricuspid. Aortic valve regurgitation is not visualized.  5. The inferior vena cava is normal in size with greater than 50% respiratory variability, suggesting right atrial pressure of 3 mmHg. FINDINGS  Left  Ventricle: Left ventricular ejection fraction, by estimation, is 70 to 75%. The left ventricle has hyperdynamic function. The left ventricle has no regional wall motion abnormalities. The left ventricular internal cavity size was normal in size. There is mild left ventricular hypertrophy. Left ventricular diastolic parameters were normal. Right Ventricle: The right ventricular size is normal. No increase in right ventricular wall thickness. Right ventricular systolic function is normal. Tricuspid regurgitation signal is inadequate for assessing PA pressure. Left Atrium: Left atrial size was normal in size. Right Atrium: Right atrial size was normal in size. Pericardium: There is no evidence of pericardial effusion. Mitral Valve: The mitral valve is grossly normal. Trivial mitral valve regurgitation. Tricuspid Valve: The tricuspid valve is grossly normal. Tricuspid valve regurgitation is trivial. Aortic Valve: The aortic valve is tricuspid. Aortic valve regurgitation is not visualized. Pulmonic Valve: The pulmonic valve was not well visualized. Pulmonic valve regurgitation is not visualized. Aorta: The aortic root is normal in size and structure. Venous: The inferior vena cava is normal in size with greater than 50% respiratory variability, suggesting right atrial pressure of 3 mmHg. IAS/Shunts: No atrial level shunt detected by color flow Doppler.  LEFT VENTRICLE PLAX 2D LVIDd:         4.05 cm  Diastology LVIDs:         2.30 cm  LV e' medial:    8.59 cm/s LV PW:         1.35 cm  LV E/e' medial:  12.2 LV IVS:  1.34 cm  LV e' lateral:   11.00 cm/s LVOT diam:     2.00 cm  LV E/e' lateral: 9.5 LVOT Area:     3.14 cm  RIGHT VENTRICLE RV S prime:     15.40 cm/s TAPSE (M-mode): 2.9 cm LEFT ATRIUM             Index       RIGHT ATRIUM           Index LA diam:        4.70 cm 2.18 cm/m  RA Area:     13.50 cm LA Vol (A2C):   73.3 ml 34.05 ml/m RA Volume:   35.40 ml  16.44 ml/m LA Vol (A4C):   39.3 ml 18.26 ml/m LA  Biplane Vol: 55.6 ml 25.83 ml/m   AORTA Ao Root diam: 2.70 cm MITRAL VALVE MV Area (PHT): 2.69 cm     SHUNTS MV Decel Time: 282 msec     Systemic Diam: 2.00 cm MV E velocity: 105.00 cm/s MV A velocity: 57.80 cm/s MV E/A ratio:  1.82 Rozann Lesches MD Electronically signed by Rozann Lesches MD Signature Date/Time: 03/11/2020/4:59:31 PM    Final     ASSESSMENT AND PLAN: 59 year old Caucasian female, with medical history of heavy smoking, COPD on home oxygen 5 L/min, hypertension, presents with worsening dyspnea and altered mental status.  Work-up showed a large laryngeal mass.  Patient required urgent tracheostomy.  1.  Supraglottic laryngeal cancer, [id number] 2. COPD with acute exacerbation, on chronic home oxygen 5 L/min 3. HTN  4.  Heavy smoking history  Recommendations:  -She has T4aN2c supraglottic laryngeal cancer, with compromised airway, required urgent tracheostomy.  -For staging scan, we ideally would like to get a PET scan.  However this is not feasible while she is in hospital, and there is quite bit social difficult to get her treated as outpt (she lives an hour away and has no transportation). CT C/A/P performed did not show evidence of distant mets.  - Ideally for T4a tumors, total laryngectomy with bilateral neck dissection is the way to go but patient refused surgery. I did explain to her if patient declines surgery, we can proceed with concurrent chemo radiation although it is likely that she may need salvage surgery if she has incomplete response. -Given baseline PS, she is not an ideal candidate for every 21 day cisplatin, hence we offered weekly cisplatin. -Started concurrent CRT on 03/29/2020.  Tolerating chemotherapy well. -Labs reviewed and are adequate for treatment today. -We will continue to monitor her periodically -Disposition plan pending.  I have tentatively scheduled her for weekly chemotherapy starting next Monday, 04/12/2020.  However, she remains in the  hospital, we will administer her weekly cisplatin in her hospital room. -Continue guaifenesin for mucolytic.  Veronica Horton   ADDENDUM: Hematology/Oncology Attending: The patient was seen and examined today.  I agree with the above note.  She is a very pleasant 59 years old white female with a T4a, N2C supraglottic laryngeal cancer and currently undergoing a course of concurrent chemoradiation with weekly cisplatin.  The patient has been tolerating her treatment well with no concerning adverse effects. Blood work today is unremarkable and it meets the parameter for her to proceed with the treatment. She will continue with her radiotherapy as planned. The patient will be seen by Dr. Chryl Heck tomorrow. Disclaimer: This note was dictated with voice recognition software. Similar sounding words can inadvertently be transcribed and may be missed upon review. Eilleen Kempf,  MD

## 2020-04-05 NOTE — Progress Notes (Signed)
Occupational Therapy Treatment Patient Details Name: Veronica Horton MRN: [id number] DOB: [birth year] Today's Date: 04/05/2020    History of present illness Veronica Horton is a 59 year old female with medical history significant for COPD with chronic hypoxic respiratory failure on 5 L, HTN, tobacco use, obesity class 3 who presented on 11/10 with worsening shortness of breath over the past 4 days and increased confusion. Pt found to be in COPD exacerbation, hospital course at [facility] complicated by dysphasia, pt found to have a laryngeal mass. Pt transferred to [facility]. Pt underwent trach placement on 11/13 s/p layrngeal mass biopsy, went on vent 11/14 in AM due to desaturation..11/22 G-tube   OT comments  Pt progressing overall towards acute OT. Limited session due to arrival of meal tray and pt leaving soon for radiation treatment. Focused on energy conservation education. D/c plan remains appropriate.    Follow Up Recommendations  Home health OT;Supervision - Intermittent    Equipment Recommendations  3 in 1 bedside commode    Recommendations for Other Services      Precautions / Restrictions Precautions Precautions: Fall Precaution Comments: trach collar, G-tube Restrictions Weight Bearing Restrictions: No       Mobility Bed Mobility Overal bed mobility: Modified Independent                Transfers                 General transfer comment: has not been needing physical A with transfers per chart review and conversation with pt    Balance                                           ADL either performed or assessed with clinical judgement   ADL Overall ADL's : Needs assistance/impaired                                       General ADL Comments: session focused on energy conservation education. Session limited by arrival of meal tray and pt leaving soon for radiation treatment     Vision       Perception     Praxis       Cognition Arousal/Alertness: Awake/alert Behavior During Therapy: WFL for tasks assessed/performed Overall Cognitive Status: Within Functional Limits for tasks assessed                                          Exercises Exercises: Other exercises Other Exercises Other Exercises: Pt declined theraband for BUE strengthening.    Shoulder Instructions       General Comments      Pertinent Vitals/ Pain       Pain Assessment: Faces Faces Pain Scale: Hurts a little bit Pain Location: unspecified Pain Intervention(s): Monitored during session  Home Living                                          Prior Functioning/Environment              Frequency  Min 2X/week        Progress Toward Goals  OT Goals(current goals can now be found in the care plan section)  Progress towards OT goals: Progressing toward goals  Acute Rehab OT Goals Patient Stated Goal: to go home OT Goal Formulation: With patient Time For Goal Achievement: 04/11/20 Potential to Achieve Goals: Good ADL Goals Pt Will Perform Grooming: with supervision;standing Pt Will Perform Lower Body Bathing: with supervision;sit to/from stand Pt Will Perform Lower Body Dressing: with supervision;sit to/from stand Pt Will Transfer to Toilet: with supervision;ambulating;regular height toilet Pt Will Perform Toileting - Clothing Manipulation and hygiene: with supervision;sit to/from stand Additional ADL Goal #1: Pt will implement energy conservation strategies in ADL and mobility.  Plan Discharge plan remains appropriate    Co-evaluation                 AM-PAC OT "6 Clicks" Daily Activity     Outcome Measure   Help from another person eating meals?: None Help from another person taking care of personal grooming?: None Help from another person toileting, which includes using toliet, bedpan, or urinal?: A Little Help from another person bathing (including washing,  rinsing, drying)?: A Little Help from another person to put on and taking off regular upper body clothing?: A Little Help from another person to put on and taking off regular lower body clothing?: A Little 6 Click Score: 20    End of Session Equipment Utilized During Treatment: Oxygen  OT Visit Diagnosis: Unsteadiness on feet (R26.81);Muscle weakness (generalized) (M62.81);Pain   Activity Tolerance Patient tolerated treatment well   Patient Left in bed;with call bell/phone within reach   Nurse Communication          Time: 4431-5400 OT Time Calculation (min): 8 min  Charges: OT General Charges $OT Visit: 1 Visit OT Treatments $Self Care/Home Management : 8-22 mins  Veronica Horton, OT Acute Rehabilitation Services Pager: [phone] Office: [phone]    Veronica Horton 04/05/2020, 2:08 PM

## 2020-04-05 NOTE — Progress Notes (Signed)
Patient due for second dose of Cisplatin today.  Chemotherapy dosage, total VTBI, 2 pharmacy signatures, expiration date/time, and patient identifiers independently verified by me and by Aldean Baker, RN.  Patient just voided 600 cc urine.  Zandra Abts [facility]  04/05/2020  11:59 AM

## 2020-04-05 NOTE — Consult Note (Signed)
Consultation Note Date: 04/05/2020   Patient Name: Veronica Horton  DOB: 09-Jan-1961  MRN: [id number]  Age / Sex: 59 y.o., female  PCP: [facility] Referring Physician: Aline August, MD  Reason for Consultation: Establishing goals of care  HPI/Patient Profile: 59 y.o. female  with past medical history of obesity, hiatal hernia, hypertension, COPD, chronic respiratory failure on 5 L oxygen prior to admission admitted on 03/10/2020 with worsened mental status and shortness of breath.  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  She declined radical neck surgery but has had tracheostomy and is undergoing chemoradiation.  Currently, she is fairly stable and working towards plan for discharge as there has been difficulty finding accepting home health agency.  Palliative consulted for goals of care.  Clinical Assessment and Goals of Care: I met today with Veronica Horton. We discussed clinical course as well as wishes moving forward in regard to advanced directives.  Concepts specific to code status and rehospitalization discussed.  We discussed difference between a aggressive medical intervention path and a palliative, comfort focused care path.  Values and goals of care important to patient and family were attempted to be elicited.  In regard to advanced care planning, we discussed MOST form.  She was open to completion today and elected for continuation of aggressive interventions.  She reports that she had just started on chemoradiation and wants to see how she does with trial of treatment prior to making any changes to her long-term care plan.  We also discussed surrogate decision making.  She does have a son, Veronica Horton, who is an adult.  She indicates that she does not feel that he would be the best  person to act as her surrogate as her sister, [name], and her long-term significant other, Nori Riis, are more readily available and would be better able to speak what her wishes would be in any particular situation.  Questions and concerns addressed.   PMT will continue to support holistically.  SUMMARY OF RECOMMENDATIONS   - Full Code, Full Scope treatment - Veronica Horton just has relatively new cancer diagnosis and is currently undergoing chemoradiation.  She is invented in continuation of aggressive interventions at this point. - We completed a MOST form today.  Full code, full scope treatment, antibiotics if indicated, IV fluids if indicated, feeding tube long-term if indicated. - We discussed surrogate decision making.  At this point, legally it appears it would be her son, Veronica Horton who would be her decision maker.  She reports that she feels that her sister, Veronica Horton, and her long-term significant other, Veronica Horton, should be her surrogate decision makers.  I placed a referral to spiritual care to help facilitate completion of [facility] POA paperwork.   Code Status/Advance Care Planning:  Full code Prognosis:   Unable to determine  Discharge Planning: Home with Home Health      Primary Diagnoses: Present on Admission: . Acute exacerbation  of chronic obstructive pulmonary disease (COPD) ([place]) . Class 3 obesity . Hypertension . Polycythemia . Acute respiratory failure with hypercapnia ([place]) . Laryngeal mass . Acute on chronic respiratory failure with hypoxia and hypercapnia ([facility])   I have reviewed the medical record, interviewed the patient and family, and examined the patient. The following aspects are pertinent.  Past Medical History:  Diagnosis Date  . Bronchitis   . Class 3 obesity 01/23/2020  . COPD (chronic obstructive pulmonary disease) ([place])   . Degenerative disc disease, lumbar   . Hiatal hernia   . Hypertension    Social History   Socioeconomic History  .  Marital status: Divorced    Spouse name: Not on file  . Number of children: Not on file  . Years of education: Not on file  . Highest education level: Not on file  Occupational History  . Not on file  Tobacco Use  . Smoking status: Current Every Day Smoker    Packs/day: 1.00  . Smokeless tobacco: Never Used  Substance and Sexual Activity  . Alcohol use: No  . Drug use: No  . Sexual activity: Yes    Birth control/protection: Post-menopausal  Other Topics Concern  . Not on file  Social History Narrative  . Not on file   Social Determinants of Health   Financial Resource Strain:   . Difficulty of Paying Living Expenses: Not on file  Food Insecurity:   . Worried About Charity fundraiser in the Last Year: Not on file  . Ran Out of Food in the Last Year: Not on file  Transportation Needs:   . Lack of Transportation (Medical): Not on file  . Lack of Transportation (Non-Medical): Not on file  Physical Activity:   . Days of Exercise per Week: Not on file  . Minutes of Exercise per Session: Not on file  Stress:   . Feeling of Stress : Not on file  Social Connections:   . Frequency of Communication with Friends and Family: Not on file  . Frequency of Social Gatherings with Friends and Family: Not on file  . Attends Religious Services: Not on file  . Active Member of Clubs or Organizations: Not on file  . Attends Archivist Meetings: Not on file  . Marital Status: Not on file   Family History  Problem Relation Age of Onset  . Hypertension Mother   . Sudden Cardiac Death Neg Hx    Scheduled Meds: . arformoterol  15 mcg Nebulization BID  . budesonide (PULMICORT) nebulizer solution  0.5 mg Nebulization BID  . chlorhexidine  15 mL Mouth Rinse BID  . Chlorhexidine Gluconate Cloth  6 each Topical Daily  . docusate  100 mg Per Tube BID  . enoxaparin (LOVENOX) injection  60 mg Subcutaneous Daily  . feeding supplement  237 mL Oral TID BM  . feeding supplement (OSMOLITE  1.5 CAL)  237 mL Per Tube TID BM  . gabapentin  600 mg Oral QHS  . insulin aspart  0-9 Units Subcutaneous Q4H  . ipratropium-albuterol  3 mL Nebulization TID  . ketotifen  1 drop Both Eyes BID  . lidocaine  1 patch Transdermal Q24H  . mouth rinse  15 mL Mouth Rinse q12n4p  . melatonin  5 mg Oral QHS  . multivitamin with minerals  1 tablet Per Tube Daily  . nicotine  21 mg Transdermal Daily  . palonosetron  0.25 mg Intravenous Once  . pantoprazole sodium  40 mg Per  Tube Daily  . scopolamine  1 patch Transdermal Q72H  . NS 1000 mL + KCL + MG +/- MANNITOL IV CISplatin hydration   Intravenous Once   Continuous Infusions: . sodium chloride    . sodium chloride    . dexamethasone (DECADRON) IVPB ([facility])    . fosaprepitant (EMEND) IV infusion 150 mg     PRN Meds:.sodium chloride, acetaminophen (TYLENOL) oral liquid 160 mg/5 mL, bisacodyl, diclofenac Sodium, diphenhydrAMINE, glucagon, guaiFENesin-dextromethorphan, hydrOXYzine, ipratropium-albuterol, lidocaine (PF), magic mouthwash w/lidocaine, oxyCODONE, polyethylene glycol, prochlorperazine Medications Prior to Admission:  Prior to Admission medications   Medication Sig Start Date End Date Taking? Authorizing Provider  albuterol (PROVENTIL) (2.5 MG/3ML) 0.083% nebulizer solution Take 2.5 mg by nebulization every 6 (six) hours as needed for wheezing or shortness of breath.   Yes [provider]  albuterol (VENTOLIN HFA) 108 (90 Base) MCG/ACT inhaler Inhale 1-2 puffs into the lungs every 6 (six) hours as needed for wheezing or shortness of breath.   Yes [provider]  feeding supplement, ENSURE ENLIVE, (ENSURE ENLIVE) LIQD Take 237 mLs by mouth 2 (two) times daily between meals. 01/25/20  Yes Shah, Pratik D, DO  folic acid (FOLVITE) 1 MG tablet Take 1 tablet (1 mg total) by mouth daily. 01/31/19  Yes [name], MD  furosemide (LASIX) 40 MG tablet Take 1 tablet (40 mg total) by mouth daily. 01/30/19 03/14/20 Yes [name], MD  gabapentin (NEURONTIN) 300 MG capsule Take 600 mg by mouth 3 (three) times daily.  12/30/18  Yes [provider]  lisinopril (ZESTRIL) 20 MG tablet Take 1 tablet (20 mg total) by mouth daily. 01/31/19  Yes [name], MD  budesonide-formoterol [facility]) 160-4.5 MCG/ACT inhaler Inhale 2 puffs into the lungs 2 (two) times daily. Patient not taking: Reported on 03/14/2020 01/30/19   [name], MD  cyclobenzaprine (FLEXERIL) 10 MG tablet Take 10 mg by mouth daily as needed for muscle spasms. Patient not taking: Reported on 03/14/2020    [provider]  nicotine (NICODERM CQ - DOSED IN MG/24 HR) 7 mg/24hr patch Place 1 patch (7 mg total) onto the skin daily. Patient not taking: Reported on 03/14/2020 01/26/20   Heath Lark D, DO  saccharomyces boulardii (FLORASTOR) 250 MG capsule Take 1 capsule (250 mg total) by mouth 2 (two) times daily. Patient not taking: Reported on 01/23/2020 01/30/19   [name], MD  vitamin B-12 (CYANOCOBALAMIN) 500 MCG tablet Take 1 tablet (500 mcg total) by mouth daily. Patient not taking: Reported on 01/23/2020 01/31/19   [name], MD   Allergies  Allergen Reactions  . Codeine Hives    Reports itching only per RN  . Penicillins Hives    Did it involve swelling of the face/tongue/throat, SOB, or low BP? No Did it involve sudden or severe rash/hives, skin peeling, or any reaction on the inside of your mouth or nose? Yes Did you need to seek medical attention at a hospital or doctor's office? Unknown When did it last happen?Over 10 years If all above answers are "NO", may proceed with cephalosporin use.    Review of Systems  Constitutional: Positive for appetite change and fatigue.  Respiratory: Positive for shortness of breath.   Psychiatric/Behavioral: Positive for sleep disturbance.   Physical Exam General: Alert, awake, in no acute distress. Trach in place.  Sitting in bedside chair. Heart: Regular rate and  rhythm. No murmur appreciated. Lungs: Good air movement, scattered crackles Abdomen: Soft, nontender, nondistended, positive bowel sounds.  Ext: No  significant edema Skin: Warm and dry Neuro: Grossly intact, nonfocal.  Vital Signs: BP (!) 99/58 (BP Location: Left Arm)   Pulse (!) 59   Temp 98.5 F (36.9 C)   Resp 18   Ht $R'5\' 5"'Ja$  (1.651 m)   Wt 116.7 kg   SpO2 92%   BMI 42.81 kg/m  Pain Scale: 0-10 POSS *See Group Information*: 1-Acceptable,Awake and alert Pain Score: 1    SpO2: SpO2: 92 % O2 Device:SpO2: 92 % O2 Flow Rate: .O2 Flow Rate (L/min): 10 L/min  IO: Intake/output summary:   Intake/Output Summary (Last 24 hours) at 04/05/2020 0911 Last data filed at 04/05/2020 0600 Gross per 24 hour  Intake 870 ml  Output 700 ml  Net 170 ml    LBM: Last BM Date: 04/03/20 Baseline Weight: Weight: 111.6 kg Most recent weight: Weight: 116.7 kg     Palliative Assessment/Data:   Flowsheet Rows     Most Recent Value  Intake Tab  Referral Department Hospitalist  Unit at Time of Referral Med/Surg Unit  Palliative Care Primary Diagnosis Cancer  Date Notified 03/31/20  Palliative Care Type New Palliative care  Reason for referral Clarify Goals of Care  Date of Admission 03/10/20  Date first seen by Palliative Care 04/02/20  # of days Palliative referral response time 2 Day(s)  # of days IP prior to Palliative referral 21  Clinical Assessment  Palliative Performance Scale Score 40%  Psychosocial & Spiritual Assessment  Palliative Care Outcomes  Patient/Family meeting held? Yes  Who was at the meeting? Patient      Time In: 1530 Time Out: 1630 Time Total: 60 Greater than 50%  of this time was spent counseling and coordinating care related to the above assessment and plan.  Signed by: [name], MD   Please contact Palliative Medicine Team phone at [phone] for questions and concerns.  For individual provider: See Shea Evans

## 2020-04-05 NOTE — Progress Notes (Signed)
Pt sats dropped to mid 80's.  Pt was suctioned, inner cannula cleaned, O2 increased to 80%.  Pt was then lavaged, bagged and suctioned . Sats currently 90% on 80% trach collar.  Will continue to monitor and titrate O2 if possible.

## 2020-04-05 NOTE — Progress Notes (Signed)
Pt alert and aware. I explained why I was there and she wrote answer. I helped get the witnesses and notoriety. We where able to complete the AD and copies where given to pt's nurse.

## 2020-04-05 NOTE — Progress Notes (Signed)
Patient ID: Veronica Horton, female   DOB: [birth year], 59 y.o.   MRN: [id number]  PROGRESS NOTE    Veronica Horton  PPJ:[id number] DOB: May 22, 1960 DOA: 03/10/2020 PCP: [facility]   Brief Narrative:  59 year old female with history of morbid obesity, hiatal hernia, hypertension, COPD, chronic respiratory failure on home oxygen at 5 L/min, smoker presented with worsening shortness of breath and altered mental status.  On presentation, ABG showed PCO2 of 92 requiring BiPAP. She was found to have laryngeal mass concerning for malignancy and she underwent biopsy which showed invasive squamous cell carcinoma and she required tracheostomy. She had to be transferred to ICU post biopsy and had to be kept on ventilator support. [facility] was [facility]/17 for radiation therapy. She was transferred to [facility] service from [facility] service on 03/20/2020. UnderwentPEG placementby IRon 03/22/2020. She started concurrent chemo radiationon 11/29.  Currently working on discharge planning.  Therapy recommending home health with 24 hr supervision/assistance.  CM currently working on [facility], some difficulty with finding accepting agency.   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 Found to have airway obstruction/compromise due to laryngeal mass. CT soft tissue neck on 11/11 showed bulky bilateral supraglottic tumor with possible airway compromise, involvement of right laryngeal cartilages. ENT was consulted. She underwent laryngeal biopsy which showed squamous cell carcinoma.  -Status post tracheostomy -UnderwentPEG placement by IR on 11/22. -CT chest/abdomen/pelvis did not show signs of metastatic disease. -She was recommended total laryngectomy with bilateral neck dissection but she declined surgery.  -Oncology/radiation oncologyfollowing.  Per oncology, started chemoradiation 11/29.   Next chemotherapy on Monday as per oncology. -Diet as per SLP recommendations.  Continue tube feeding as per dietary -palliative care evaluation for goals of care discussion is pending.  Acute on chronic hypoxic/hypercarbic respiratory failure: History of COPD, on oxygen at home at 5 L/min. Chronic respiratory failure exacerbated by laryngeal squamous cell carcinoma, COPD exacerbation, pneumonia.  -Hospital course was remarkable for Serratia pneumonia. Finished the course of antibiotics with levofloxacin.  -PCCM following: Size 6 cuffed Shiley removed and size 6 uncuffed Shiley placed by PCCM on 04/01/2020.  She is currently still requiring 10 L oxygen via tracheostomy. -Chest x-ray from 04/03/2020 showed persistent bibasilar opacities with slightly improved aeration.  COPD exacerbation:  -Continue pulmonary hygiene, ventilator support at night, trach collar during the day. Follow intermittent chest x-ray. Aspiration precautions.Steroids have been tapered off. No wheezing appreciated. -Continue nebs  Hypoglycemia: -Continue tube feeds and increase oral intake as tolerated  GERD: Continue PPI  Hypertension:Monitor blood pressure. Off antihypertensives which she was at home. Blood pressure okay.  AKI: Resolved  Thrombocytopenia: resolved.  Disposition:therapy now recommending HH and 24 hr supervision/assistance, CM working on home health, but apparently no accepting agency.  Will need to ensure she's able to manage trach and o2 needs at home as well.     DVT prophylaxis:lovenox Code Status: Full Family Communication:  None at bedside Disposition Plan: Status is: Inpatient  Remains inpatient appropriate because:Inpatient level of care appropriate due to severity of illness   Dispo: The patient is from: Home              Anticipated d/c is to: Home              Anticipated d/c date is: > 3 days              Patient currently is  medically stable to d/c.  If  appropriate home health agency can be found   Consultants: PCCM/ENT/IR/oncology/radiation oncology  Procedures:  Intubation/extubation Tracheostomy PEG tube placement  Echo IMPRESSIONS    1. Left ventricular ejection fraction, by estimation, is 70 to 75%. The  left ventricle has hyperdynamic function. The left ventricle has no  regional wall motion abnormalities. There is mild left ventricular  hypertrophy. Left ventricular diastolic  parameters were normal.  2. Right ventricular systolic function is normal. The right ventricular  size is normal. Tricuspid regurgitation signal is inadequate for assessing  PA pressure.  3. The mitral valve is grossly normal. Trivial mitral valve  regurgitation.  4. The aortic valve is tricuspid. Aortic valve regurgitation is not  visualized.  5. The inferior vena cava is normal in size with greater than 50%  respiratory variability, suggesting right atrial pressure of 3 mmHg.   Antimicrobials:  Anti-infectives (From admission, onward)   Start     Dose/Rate Route Frequency Ordered Stop   03/22/20 1700  vancomycin (VANCOCIN) IVPB 1000 mg/200 mL premix  Status:  Discontinued        1,000 mg 200 mL/hr over 60 Minutes Intravenous  Once 03/22/20 1605 03/23/20 0236   03/22/20 1604  vancomycin (VANCOCIN) 1-5 GM/200ML-% IVPB       Note to Pharmacy: [name]   : cabinet override      03/22/20 1604 03/22/20 1615   03/14/20 1100  levofloxacin (LEVAQUIN) IVPB 750 mg        750 mg 100 mL/hr over 90 Minutes Intravenous Daily 03/14/20 0955 03/19/20 1146   03/10/20 1730  doxycycline (VIBRAMYCIN) 100 mg in sodium chloride 0.9 % 250 mL IVPB  Status:  Discontinued        100 mg 125 mL/hr over 120 Minutes Intravenous Every 12 hours 03/10/20 1634 03/14/20 0955   03/10/20 1600  azithromycin (ZITHROMAX) 500 mg in sodium chloride 0.9 % 250 mL IVPB  Status:  Discontinued        500 mg 250 mL/hr over 60 Minutes Intravenous Every 24 hours 03/10/20 1529  03/10/20 1538   03/10/20 1500  clindamycin (CLEOCIN) IVPB 600 mg  Status:  Discontinued        600 mg 100 mL/hr over 30 Minutes Intravenous  Once 03/10/20 1458 03/10/20 1521       Subjective: Patient seen and examined at bedside.  Poor historian.  Still complains of intermittent cough and mild shortness of breath.  Nods her head to some questions.  No overnight fever reported.  objective: Vitals:   04/05/20 0023 04/05/20 0353 04/05/20 0500 04/05/20 0746  BP:   (!) 99/58   Pulse: 66 62 (!) 59   Resp: 18 18 18    Temp:   98.5 F (36.9 C)   TempSrc:      SpO2: 92% 92% 100% 92%  Weight:   116.7 kg   Height:        Intake/Output Summary (Last 24 hours) at 04/05/2020 0759 Last data filed at 04/05/2020 0600 Gross per 24 hour  Intake 870 ml  Output 700 ml  Net 170 ml   Filed Weights   04/03/20 0340 04/04/20 0500 04/05/20 0500  Weight: 123.5 kg 118.5 kg 116.7 kg    Examination:  General exam: No acute distress.  Chronically ill looking female lying in bed.  Currently still on 10 L oxygen via tracheostomy  ENT: Tracheostomy present respiratory system: Decreased breath sounds at bases bilaterally with scattered crackles cardiovascular system: Intermittently bradycardic,  S1-S2 heard gastrointestinal system: Abdomen is morbidly obese, nondistended, soft and nontender.  Bowel sounds heard.  PEG tube is present.   Extremities: Trace lower extremity edema present.  No clubbing  Central nervous system: Moves extremities.  No focal neurological deficits.  Nods her head to some questions.   Skin: No obvious lesions/petechiae psychiatry: Affect is flat  Data Reviewed: I have personally reviewed following labs and imaging studies  CBC: Recent Labs  Lab 03/30/20 0819 03/31/20 0611 04/02/20 1446 04/05/20 0517  WBC 9.8 8.6 5.5 5.8  NEUTROABS 9.1* 6.8 4.2 4.1  HGB 13.3 12.5 12.0 12.0  HCT 41.1 39.2 37.6 37.5  MCV 97.9 99.0 99.5 98.7  PLT 169 170 149* 947   Basic Metabolic  Panel: Recent Labs  Lab 03/30/20 0819 03/31/20 0611 04/02/20 1446 04/05/20 0517  NA 138 135 135 137  K 4.9 4.8 4.1 4.4  CL 103 99 99 99  CO2 27 29 28 29   GLUCOSE 124* 94 101* 89  BUN 26* 34* 20 14  CREATININE 0.90 1.19* 0.99 1.00  CALCIUM 8.9 8.6* 8.5* 8.5*  MG 2.7* 2.4  --  2.1  PHOS 4.1 5.4*  --   --    GFR: Estimated Creatinine Clearance: 76.4 mL/min (by C-G formula based on SCr of 1 mg/dL). Liver Function Tests: Recent Labs  Lab 03/30/20 0819 03/31/20 0611 04/02/20 1446  AST 17 19 15   ALT 16 22 18   ALKPHOS 56 52 61  BILITOT 0.8 0.5 0.6  PROT 6.4* 5.8* 5.9*  ALBUMIN 2.9* 2.9* 3.0*   No results for input(s): LIPASE, AMYLASE in the last 168 hours. No results for input(s): AMMONIA in the last 168 hours. Coagulation Profile: No results for input(s): INR, PROTIME in the last 168 hours. Cardiac Enzymes: No results for input(s): CKTOTAL, CKMB, CKMBINDEX, TROPONINI in the last 168 hours. BNP (last 3 results) No results for input(s): PROBNP in the last 8760 hours. HbA1C: No results for input(s): HGBA1C in the last 72 hours. CBG: Recent Labs  Lab 04/04/20 1646 04/04/20 2029 04/04/20 2355 04/05/20 0452 04/05/20 0744  GLUCAP 103* 93 151* 87 88   Lipid Profile: No results for input(s): CHOL, HDL, LDLCALC, TRIG, CHOLHDL, LDLDIRECT in the last 72 hours. Thyroid Function Tests: No results for input(s): TSH, T4TOTAL, FREET4, T3FREE, THYROIDAB in the last 72 hours. Anemia Panel: No results for input(s): VITAMINB12, FOLATE, FERRITIN, TIBC, IRON, RETICCTPCT in the last 72 hours. Sepsis Labs: No results for input(s): PROCALCITON, LATICACIDVEN in the last 168 hours.  Recent Results (from the past 240 hour(s))  Culture, respiratory (non-expectorated)     Status: None (Preliminary result)   Collection Time: 04/02/20  3:36 PM   Specimen: Tracheal Aspirate; Respiratory  Result Value Ref Range Status   Specimen Description   Final    TRACHEAL ASPIRATE Performed at [facility] [address], [place], [place] [address]    Special Requests   Final    NONE Performed at [facility] [place] [address], [place], [place] [address]    Gram Stain   Final    RARE WBC PRESENT, PREDOMINANTLY PMN RARE GRAM POSITIVE COCCI RARE GRAM NEGATIVE RODS    Culture   Final    MODERATE SERRATIA MARCESCENS SUSCEPTIBILITIES TO FOLLOW Performed at [facility], [place] [address], [place], [place] [address]    Report Status PENDING  Incomplete         Radiology Studies: DG CHEST PORT 1 VIEW  Result Date:  04/03/2020 CLINICAL DATA:  Increasing shortness of breath EXAM: PORTABLE CHEST 1 VIEW COMPARISON:  03/31/2020 FINDINGS: Tracheostomy tube is noted in satisfactory position. Cardiac shadow is enlarged but stable. Some persistent bibasilar opacities are noted although mildly improved when compared with the prior exam. No new focal abnormality is noted. IMPRESSION: Improved aeration as described. Electronically Signed   By: Inez Catalina M.D.   On: 04/03/2020 11:59        Scheduled Meds: . arformoterol  15 mcg Nebulization BID  . budesonide (PULMICORT) nebulizer solution  0.5 mg Nebulization BID  . chlorhexidine  15 mL Mouth Rinse BID  . Chlorhexidine Gluconate Cloth  6 each Topical Daily  . docusate  100 mg Per Tube BID  . enoxaparin (LOVENOX) injection  60 mg Subcutaneous Daily  . feeding supplement  237 mL Oral TID BM  . feeding supplement (OSMOLITE 1.5 CAL)  237 mL Per Tube TID BM  . gabapentin  600 mg Oral QHS  . insulin aspart  0-9 Units Subcutaneous Q4H  . ipratropium-albuterol  3 mL Nebulization TID  . ketotifen  1 drop Both Eyes BID  . lidocaine  1 patch Transdermal Q24H  . mouth rinse  15 mL Mouth Rinse q12n4p  . melatonin  5 mg Oral QHS  . multivitamin with minerals  1 tablet Per Tube Daily  . nicotine  21 mg Transdermal Daily  . pantoprazole sodium  40 mg Per Tube Daily  . scopolamine  1 patch Transdermal Q72H    Continuous Infusions: . sodium chloride            Aline August, MD [facility] 04/05/2020, 7:59 AM

## 2020-04-06 ENCOUNTER — Inpatient Hospital Stay: Payer: MEDICAID | Admitting: Nutrition

## 2020-04-06 ENCOUNTER — Ambulatory Visit
Admit: 2020-04-06 | Discharge: 2020-04-06 | Disposition: A | Discharge status: Home / Self Care | Payer: Medicaid Other | Attending: Radiation Oncology | Admitting: Radiation Oncology

## 2020-04-06 LAB — GLUCOSE, CAPILLARY
Glucose-Capillary: 101 mg/dL — ABNORMAL HIGH (ref 70–99)
Glucose-Capillary: 109 mg/dL — ABNORMAL HIGH (ref 70–99)
Glucose-Capillary: 115 mg/dL — ABNORMAL HIGH (ref 70–99)
Glucose-Capillary: 129 mg/dL — ABNORMAL HIGH (ref 70–99)
Glucose-Capillary: 129 mg/dL — ABNORMAL HIGH (ref 70–99)
Glucose-Capillary: 168 mg/dL — ABNORMAL HIGH (ref 70–99)
Glucose-Capillary: 170 mg/dL — ABNORMAL HIGH (ref 70–99)

## 2020-04-06 MED ORDER — PROCHLORPERAZINE EDISYLATE 10 MG/2ML IJ SOLN
10.0000 mg | Freq: Four times a day (QID) | INTRAMUSCULAR | Status: DC | PRN
  Administered 2020-04-06: 10 mg via INTRAVENOUS
  Filled 2020-04-06: qty 2

## 2020-04-06 NOTE — Progress Notes (Signed)
Patient refusing suppository for constipation, states an enema is the only thing that will work. Education provided on suppository medications and progress of clinical pathway

## 2020-04-06 NOTE — Plan of Care (Signed)
  Problem: Clinical Measurements: Goal: Ability to maintain clinical measurements within normal limits will improve Outcome: Progressing   

## 2020-04-06 NOTE — Progress Notes (Signed)
Patient refusing tube feed 04/06/2020 @ 2000, states she feels full, residual checked, and 43mL aspirated. Pt states she has not had a BM since day before yesterday, is nauseous, and has not passed gas since yesterday. Bowel sounds active and audible in all four quadrants, patient stating she feels she the urge to produce a BM but is unable to.

## 2020-04-06 NOTE — Progress Notes (Signed)
Patient ID: Veronica Horton, female   DOB: 23-Jul-1960, 59 y.o.   MRN: [id number]  PROGRESS NOTE    Veronica Horton  ERX:[id number] DOB: 14-Aug-1960 DOA: 03/10/2020 PCP: [facility]   Brief Narrative:  59 year old female with history of morbid obesity, hiatal hernia, hypertension, COPD, chronic respiratory failure on home oxygen at 5 L/min, smoker presented with worsening shortness of breath and altered mental status.  On presentation, ABG showed PCO2 of 92 requiring BiPAP. She was found to have laryngeal mass concerning for malignancy and she underwent biopsy which showed invasive squamous cell carcinoma and she required tracheostomy. She had to be transferred to ICU post biopsy and had to be kept on ventilator support. She was transferred to [facility] on 11/17 for radiation therapy. She was transferred to [facility] service from [facility] service on 03/20/2020. UnderwentPEG placementby IRon 03/22/2020. She started concurrent chemo radiationon 11/29.  Currently working on discharge planning.  Therapy recommending home health with 24 hr supervision/assistance.  CM currently working on [facility], some difficulty with finding accepting agency.   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 Found to have airway obstruction/compromise due to laryngeal mass. CT soft tissue neck on 11/11 showed bulky bilateral supraglottic tumor with possible airway compromise, involvement of right laryngeal cartilages. ENT was consulted. She underwent laryngeal biopsy which showed squamous cell carcinoma.  -Status post tracheostomy -UnderwentPEG placement by IR on 11/22. -CT chest/abdomen/pelvis did not show signs of metastatic disease. -She was recommended total laryngectomy with bilateral neck dissection but she declined surgery.  -Oncology/radiation oncologyfollowing.  Per oncology, started chemoradiation 11/29.   She is getting weekly cisplatin.  She received her second dose yesterday on 04/05/2020. -Diet as per SLP recommendations.  Continue tube feeding as per dietary -palliative care evaluation has evaluated the patient for goals of care.  Remains full code.  Acute on chronic hypoxic/hypercarbic respiratory failure: History of COPD, on oxygen at home at 5 L/min. Chronic respiratory failure exacerbated by laryngeal squamous cell carcinoma, COPD exacerbation, pneumonia.  -Hospital course was remarkable for Serratia pneumonia. Finished the course of antibiotics with levofloxacin.  -PCCM following: Size 6 cuffed Shiley removed and size 6 uncuffed Shiley placed by PCCM on 04/01/2020.  She is currently still requiring 10 L oxygen via tracheostomy. -Chest x-ray from 04/03/2020 showed persistent bibasilar opacities with slightly improved aeration.  COPD exacerbation:  -Continue pulmonary hygiene, ventilator support at night, trach collar during the day. Follow intermittent chest x-ray. Aspiration precautions.Steroids have been tapered off. No wheezing appreciated. -Continue nebs  Hypoglycemia: -Continue tube feeds and increase oral intake as tolerated  GERD: Continue PPI  Hypertension:Monitor blood pressure. Off antihypertensives which she was at home. Blood pressure okay.  AKI: Resolved  Thrombocytopenia: resolved.  Disposition:therapy now recommending HH and 24 hr supervision/assistance, CM working on home health, but apparently no accepting agency.  Will need to ensure she's able to manage trach and oxygen needs at home as well.     DVT prophylaxis:lovenox Code Status: Full Family Communication:  None at bedside Disposition Plan: Status is: Inpatient  Remains inpatient appropriate because:Inpatient level of care appropriate due to severity of illness   Dispo: The patient is from: Home              Anticipated d/c is to: Home              Anticipated d/c date is: > 3  days  Patient currently is medically stable to d/c.  If appropriate home health agency can be found   Consultants: PCCM/ENT/IR/oncology/radiation oncology  Procedures:  Intubation/extubation Tracheostomy PEG tube placement  Echo IMPRESSIONS    1. Left ventricular ejection fraction, by estimation, is 70 to 75%. The  left ventricle has hyperdynamic function. The left ventricle has no  regional wall motion abnormalities. There is mild left ventricular  hypertrophy. Left ventricular diastolic  parameters were normal.  2. Right ventricular systolic function is normal. The right ventricular  size is normal. Tricuspid regurgitation signal is inadequate for assessing  PA pressure.  3. The mitral valve is grossly normal. Trivial mitral valve  regurgitation.  4. The aortic valve is tricuspid. Aortic valve regurgitation is not  visualized.  5. The inferior vena cava is normal in size with greater than 50%  respiratory variability, suggesting right atrial pressure of 3 mmHg.   Antimicrobials:  Anti-infectives (From admission, onward)   Start     Dose/Rate Route Frequency Ordered Stop   03/22/20 1700  vancomycin (VANCOCIN) IVPB 1000 mg/200 mL premix  Status:  Discontinued        1,000 mg 200 mL/hr over 60 Minutes Intravenous  Once 03/22/20 1605 03/23/20 0236   03/22/20 1604  vancomycin (VANCOCIN) 1-5 GM/200ML-% IVPB       Note to Pharmacy: [name]   : cabinet override      03/22/20 1604 03/22/20 1615   03/14/20 1100  levofloxacin (LEVAQUIN) IVPB 750 mg        750 mg 100 mL/hr over 90 Minutes Intravenous Daily 03/14/20 0955 03/19/20 1146   03/10/20 1730  doxycycline (VIBRAMYCIN) 100 mg in sodium chloride 0.9 % 250 mL IVPB  Status:  Discontinued        100 mg 125 mL/hr over 120 Minutes Intravenous Every 12 hours 03/10/20 1634 03/14/20 0955   03/10/20 1600  azithromycin (ZITHROMAX) 500 mg in sodium chloride 0.9 % 250 mL IVPB  Status:  Discontinued        500  mg 250 mL/hr over 60 Minutes Intravenous Every 24 hours 03/10/20 1529 03/10/20 1538   03/10/20 1500  clindamycin (CLEOCIN) IVPB 600 mg  Status:  Discontinued        600 mg 100 mL/hr over 30 Minutes Intravenous  Once 03/10/20 1458 03/10/20 1521       Subjective: Patient seen and examined at bedside.  Poor historian.  Nods her head to some questions.  Denies worsening shortness of breath or vomiting.  No overnight fever reported.   Objective: Vitals:   04/06/20 0042 04/06/20 0438 04/06/20 0629 04/06/20 0900  BP:   (!) 109/57   Pulse: (!) 56 63 (!) 50   Resp: 16 16 18    Temp:   98.2 F (36.8 C)   TempSrc:   Oral   SpO2: 92% 91% 95% 94%  Weight:   118.7 kg   Height:        Intake/Output Summary (Last 24 hours) at 04/06/2020 0914 Last data filed at 04/05/2020 2051 Gross per 24 hour  Intake 756.75 ml  Output 2300 ml  Net -1543.25 ml   Filed Weights   04/04/20 0500 04/05/20 0500 04/06/20 0629  Weight: 118.5 kg 116.7 kg 118.7 kg    Examination:  General exam: No distress.  Chronically ill looking female lying in bed.  Still requiring 10 L oxygen via tracheostomy  ENT: Trach present respiratory system: Bilateral decreased breath sounds at bases with some crackles, no wheezing  cardiovascular system:  S1-S2 heard, bradycardic gastrointestinal system: Abdomen is morbidly obese, nondistended, soft and nontender.  PEG tube present.  Normal bowel sounds heard. Extremities: No cyanosis.  Mild lower extremity edema present.   Central nervous system: Moving extremities.  No obvious focal neurological deficits.  Nods her head to some questions.   Skin: No obvious ecchymosis/lesions psychiatry: Flat affect  Data Reviewed: I have personally reviewed following labs and imaging studies  CBC: Recent Labs  Lab 03/31/20 0611 04/02/20 1446 04/05/20 0517  WBC 8.6 5.5 5.8  NEUTROABS 6.8 4.2 4.1  HGB 12.5 12.0 12.0  HCT 39.2 37.6 37.5  MCV 99.0 99.5 98.7  PLT 170 149* 299   Basic  Metabolic Panel: Recent Labs  Lab 03/31/20 0611 04/02/20 1446 04/05/20 0517  NA 135 135 137  K 4.8 4.1 4.4  CL 99 99 99  CO2 29 28 29   GLUCOSE 94 101* 89  BUN 34* 20 14  CREATININE 1.19* 0.99 1.00  CALCIUM 8.6* 8.5* 8.5*  MG 2.4  --  2.1  PHOS 5.4*  --   --    GFR: Estimated Creatinine Clearance: 77.2 mL/min (by C-G formula based on SCr of 1 mg/dL). Liver Function Tests: Recent Labs  Lab 03/31/20 0611 04/02/20 1446  AST 19 15  ALT 22 18  ALKPHOS 52 61  BILITOT 0.5 0.6  PROT 5.8* 5.9*  ALBUMIN 2.9* 3.0*   No results for input(s): LIPASE, AMYLASE in the last 168 hours. No results for input(s): AMMONIA in the last 168 hours. Coagulation Profile: No results for input(s): INR, PROTIME in the last 168 hours. Cardiac Enzymes: No results for input(s): CKTOTAL, CKMB, CKMBINDEX, TROPONINI in the last 168 hours. BNP (last 3 results) No results for input(s): PROBNP in the last 8760 hours. HbA1C: No results for input(s): HGBA1C in the last 72 hours. CBG: Recent Labs  Lab 04/05/20 1627 04/05/20 2027 04/06/20 0056 04/06/20 0623 04/06/20 0747  GLUCAP 128* 237* 168* 129* 115*   Lipid Profile: No results for input(s): CHOL, HDL, LDLCALC, TRIG, CHOLHDL, LDLDIRECT in the last 72 hours. Thyroid Function Tests: No results for input(s): TSH, T4TOTAL, FREET4, T3FREE, THYROIDAB in the last 72 hours. Anemia Panel: No results for input(s): VITAMINB12, FOLATE, FERRITIN, TIBC, IRON, RETICCTPCT in the last 72 hours. Sepsis Labs: No results for input(s): PROCALCITON, LATICACIDVEN in the last 168 hours.  Recent Results (from the past 240 hour(s))  Culture, respiratory (non-expectorated)     Status: None   Collection Time: 04/02/20  3:36 PM   Specimen: Tracheal Aspirate; Respiratory  Result Value Ref Range Status   Specimen Description   Final    TRACHEAL ASPIRATE Performed at [facility] [address], [place], [place] [address]    Special Requests   Final     NONE Performed at [facility], [place] [address], [place], [place] [address]    Gram Stain   Final    RARE WBC PRESENT, PREDOMINANTLY PMN RARE GRAM POSITIVE COCCI RARE GRAM NEGATIVE RODS Performed at [facility], [place] [address] [place],  [address]    Culture MODERATE SERRATIA MARCESCENS  Final   Report Status 04/05/2020 FINAL  Final   Organism ID, Bacteria SERRATIA MARCESCENS  Final      Susceptibility   Serratia marcescens - MIC*    CEFAZOLIN >=64 RESISTANT Resistant     CEFEPIME <=0.12 SENSITIVE Sensitive     CEFTAZIDIME <=1 SENSITIVE Sensitive     CEFTRIAXONE <=0.25 SENSITIVE Sensitive  CIPROFLOXACIN <=0.25 SENSITIVE Sensitive     GENTAMICIN <=1 SENSITIVE Sensitive     TRIMETH/SULFA <=20 SENSITIVE Sensitive     * MODERATE SERRATIA MARCESCENS         Radiology Studies: No results found.      Scheduled Meds: . arformoterol  15 mcg Nebulization BID  . budesonide (PULMICORT) nebulizer solution  0.5 mg Nebulization BID  . chlorhexidine  15 mL Mouth Rinse BID  . docusate  100 mg Per Tube BID  . enoxaparin (LOVENOX) injection  60 mg Subcutaneous Daily  . feeding supplement  237 mL Oral TID BM  . feeding supplement (OSMOLITE 1.5 CAL)  237 mL Per Tube TID BM  . gabapentin  600 mg Oral QHS  . insulin aspart  0-9 Units Subcutaneous Q4H  . ipratropium-albuterol  3 mL Nebulization TID  . ketotifen  1 drop Both Eyes BID  . lidocaine  1 patch Transdermal Q24H  . mouth rinse  15 mL Mouth Rinse q12n4p  . melatonin  5 mg Oral QHS  . multivitamin with minerals  1 tablet Per Tube Daily  . nicotine  21 mg Transdermal Daily  . pantoprazole sodium  40 mg Per Tube Daily  . scopolamine  1 patch Transdermal Q72H   Continuous Infusions: . sodium chloride            Aline August, MD [facility] 04/06/2020, 9:14 AM

## 2020-04-06 NOTE — Progress Notes (Signed)
Physical Therapy Treatment Patient Details Name: Veronica Horton MRN: [id number] DOB: 30-Apr-1961 Today's Date: 04/06/2020    History of Present Illness [name] is a 59 year old female with medical history significant for COPD with chronic hypoxic respiratory failure on 5 L, HTN, tobacco use, obesity class 3 who presented on 11/10 with worsening shortness of breath over the past 4 days and increased confusion. Veronica Horton found to be in COPD exacerbation, hospital course at [facility] complicated by dysphasia, Veronica Horton found to have a laryngeal mass. Veronica Horton transferred to [facility]. Veronica Horton underwent trach placement on 11/13 s/p layrngeal mass biopsy, went on vent 11/14 in AM due to desaturation..11/22 G-tube    Veronica Horton Comments    Veronica Horton continues to be cooperative and motivated. She reports she amb in room with nursing staff this weekend.  amb hallway distance today on 10L, 60%. SpO2=88-96% with activity.  Continue to follow in acute setting.   Follow Up Recommendations  Home health Veronica Horton;Supervision - Intermittent     Equipment Recommendations  None recommended by Veronica Horton    Recommendations for Other Services       Precautions / Restrictions Precautions Precautions: Fall Precaution Comments: trach collar, G-tube Restrictions Weight Bearing Restrictions: No    Mobility  Bed Mobility Overal bed mobility: Modified Independent       Supine to sit: Modified independent (Device/Increase time);HOB elevated     General bed mobility comments: Mod I - increased time and use of bed rail.  Transfers Overall transfer level: Needs assistance Equipment used: Rolling walker (2 wheeled) Transfers: Sit to/from Stand Sit to Stand: Supervision         General transfer comment: for safety, line management   Ambulation/Gait Ambulation/Gait assistance: Supervision;Min guard Gait Distance (Feet): 240 Feet Assistive device: Rolling walker (2 wheeled) Gait Pattern/deviations: Step-through pattern;Trunk flexed;Wide base of  support Gait velocity: decreased   General Gait Details: cues for RW positioning, ambulated on 10L 60% trach collar   Stairs             Wheelchair Mobility    Modified Rankin (Stroke Patients Only)       Balance   Sitting-balance support: No upper extremity supported;Feet supported Sitting balance-Leahy Scale: Good       Standing balance-Leahy Scale: Fair Standing balance comment: use of RW/reliant on UEs for gait. able to static stand without UE support                            Cognition Arousal/Alertness: Awake/alert Behavior During Therapy: WFL for tasks assessed/performed Overall Cognitive Status: Within Functional Limits for tasks assessed                                        Exercises      General Comments General comments (skin integrity, edema, etc.): sat EOB for recovery, emphasis on trunk extension, Veronica Horton fatigues quickly       Pertinent Vitals/Pain Pain Assessment: Faces Faces Pain Scale: Hurts a little bit Pain Location: "butt" Pain Descriptors / Indicators: Grimacing Pain Intervention(s): Limited activity within patient's tolerance;Monitored during session;Repositioned    Home Living                      Prior Function            Veronica Horton Goals (current goals can now be found in the care plan  section) Acute Rehab Veronica Horton Goals Patient Stated Goal: to go home Veronica Horton Goal Formulation: With patient Time For Goal Achievement: 04/16/20 Potential to Achieve Goals: Good Progress towards Veronica Horton goals: Progressing toward goals    Frequency    Min 3X/week      Veronica Horton Plan Current plan remains appropriate    Co-evaluation              AM-PAC Veronica Horton "6 Clicks" Mobility   Outcome Measure  Help needed turning from your back to your side while in a flat bed without using bedrails?: None Help needed moving from lying on your back to sitting on the side of a flat bed without using bedrails?: None Help needed moving to  and from a bed to a chair (including a wheelchair)?: A Little Help needed standing up from a chair using your arms (e.g., wheelchair or bedside chair)?: None Help needed to walk in hospital room?: A Little Help needed climbing 3-5 steps with a railing? : A Little 6 Click Score: 21    End of Session Equipment Utilized During Treatment: Gait belt;Oxygen Activity Tolerance: Patient tolerated treatment well Patient left: in bed;with call bell/phone within reach;with bed alarm set   Veronica Horton Visit Diagnosis: Muscle weakness (generalized) (M62.81);Difficulty in walking, not elsewhere classified (R26.2)     Time: 5732-2025 Veronica Horton Time Calculation (min) (ACUTE ONLY): 25 min  Charges:  $Gait Training: 23-37 mins                     Baxter Flattery, Veronica Horton  Acute Rehab Dept ([place]) [phone] Pager [phone]  04/06/2020    [facility] 04/06/2020, 4:02 PM

## 2020-04-07 ENCOUNTER — Inpatient Hospital Stay (HOSPITAL_COMMUNITY): Payer: Medicaid Other

## 2020-04-07 ENCOUNTER — Ambulatory Visit
Admit: 2020-04-07 | Discharge: 2020-04-07 | Disposition: A | Discharge status: Home / Self Care | Payer: Medicaid Other | Attending: Radiation Oncology | Admitting: Radiation Oncology

## 2020-04-07 DIAGNOSIS — R1030 Lower abdominal pain, unspecified: Secondary | ICD-10-CM

## 2020-04-07 LAB — CBC WITH DIFFERENTIAL/PLATELET
Abs Immature Granulocytes: 0.03 10*3/uL (ref 0.00–0.07)
Basophils Absolute: 0 10*3/uL (ref 0.0–0.1)
Basophils Relative: 0 %
Eosinophils Absolute: 0 10*3/uL (ref 0.0–0.5)
Eosinophils Relative: 0 %
HCT: 33.9 % — ABNORMAL LOW (ref 36.0–46.0)
Hemoglobin: 10.9 g/dL — ABNORMAL LOW (ref 12.0–15.0)
Immature Granulocytes: 0 %
Lymphocytes Relative: 12 %
Lymphs Abs: 0.9 10*3/uL (ref 0.7–4.0)
MCH: 31.6 pg (ref 26.0–34.0)
MCHC: 32.2 g/dL (ref 30.0–36.0)
MCV: 98.3 fL (ref 80.0–100.0)
Monocytes Absolute: 0.8 10*3/uL (ref 0.1–1.0)
Monocytes Relative: 11 %
Neutro Abs: 5.7 10*3/uL (ref 1.7–7.7)
Neutrophils Relative %: 77 %
Platelets: 177 10*3/uL (ref 150–400)
RBC: 3.45 MIL/uL — ABNORMAL LOW (ref 3.87–5.11)
RDW: 14.3 % (ref 11.5–15.5)
WBC: 7.4 10*3/uL (ref 4.0–10.5)
nRBC: 0 % (ref 0.0–0.2)

## 2020-04-07 LAB — GLUCOSE, CAPILLARY
Glucose-Capillary: 65 mg/dL — ABNORMAL LOW (ref 70–99)
Glucose-Capillary: 114 mg/dL — ABNORMAL HIGH (ref 70–99)
Glucose-Capillary: 119 mg/dL — ABNORMAL HIGH (ref 70–99)
Glucose-Capillary: 90 mg/dL (ref 70–99)
Glucose-Capillary: 128 mg/dL — ABNORMAL HIGH (ref 70–99)
Glucose-Capillary: 128 mg/dL — ABNORMAL HIGH (ref 70–99)

## 2020-04-07 LAB — BASIC METABOLIC PANEL
Anion gap: 8 (ref 5–15)
BUN: 34 mg/dL — ABNORMAL HIGH (ref 6–20)
CO2: 27 mmol/L (ref 22–32)
Calcium: 8.3 mg/dL — ABNORMAL LOW (ref 8.9–10.3)
Chloride: 99 mmol/L (ref 98–111)
Creatinine, Ser: 1.36 mg/dL — ABNORMAL HIGH (ref 0.44–1.00)
GFR, Estimated: 45 mL/min — ABNORMAL LOW (ref >60)
Glucose, Bld: 126 mg/dL — ABNORMAL HIGH (ref 70–99)
Potassium: 4.4 mmol/L (ref 3.5–5.1)
Sodium: 134 mmol/L — ABNORMAL LOW (ref 135–145)

## 2020-04-07 LAB — MAGNESIUM: Magnesium: 2.3 mg/dL (ref 1.7–2.4)

## 2020-04-07 MED ORDER — POLYETHYLENE GLYCOL 3350 17 G PO PACK
17.0000 g | PACK | Freq: Two times a day (BID) | ORAL | Status: DC
  Administered 2020-04-07 – 2020-04-13 (×12): 17 g
  Filled 2020-04-07 (×12): qty 1

## 2020-04-07 MED ORDER — IPRATROPIUM-ALBUTEROL 0.5-2.5 (3) MG/3ML IN SOLN
3.0000 mL | Freq: Two times a day (BID) | RESPIRATORY_TRACT | Status: DC
  Administered 2020-04-07 – 2020-04-11 (×8): 3 mL via RESPIRATORY_TRACT
  Filled 2020-04-07 (×8): qty 3

## 2020-04-07 MED ORDER — PROCHLORPERAZINE EDISYLATE 10 MG/2ML IJ SOLN
10.0000 mg | Freq: Four times a day (QID) | INTRAMUSCULAR | Status: DC | PRN
  Administered 2020-04-10 – 2020-04-13 (×2): 10 mg via INTRAVENOUS
  Filled 2020-04-07 (×2): qty 2

## 2020-04-07 MED ORDER — PROCHLORPERAZINE MALEATE 10 MG PO TABS: 10.0000 mg | ORAL_TABLET | Freq: Four times a day (QID) | ORAL | Status: DC | PRN

## 2020-04-07 MED ORDER — LACTULOSE ENEMA
300.0000 mL | Freq: Once | ORAL | Status: AC
  Administered 2020-04-07: 300 mL via RECTAL
  Filled 2020-04-07: qty 300

## 2020-04-07 NOTE — Progress Notes (Signed)
PT is refusing tube feeds due to abdominal pain, pending imaging. Patient's CBG 128 and orders for novolog are 1 unit for this CBG. Patient experienced hypoglycemic event yesterday at 0400 of CBG 65 after receiving 2 units for similar CBG and refusing tube feedings due to constipation. Requested hold for Novolog for this evening to prevent further hypoglycemic events.

## 2020-04-07 NOTE — Progress Notes (Signed)
Occupational Therapy Treatment Patient Details Name: Veronica Horton MRN: [id number] DOB: 06/11/1960 Today's Date: 04/07/2020    History of present illness Ms. Taffe is a 59 year old female with medical history significant for COPD with chronic hypoxic respiratory failure on 5 L, HTN, tobacco use, obesity class 3 who presented on 11/10 with worsening shortness of breath over the past 4 days and increased confusion. Pt found to be in COPD exacerbation, hospital course at [facility] complicated by dysphasia, pt found to have a laryngeal mass. Pt transferred to [facility]. Pt underwent trach placement on 11/13 s/p layrngeal mass biopsy, went on vent 11/14 in AM due to desaturation..11/22 G-tube   OT comments  Treatment focused on improving independence with self care tasks. Patient requiring increased time and use of bed rails for bed mobility (mod I), min guard for standing (patient needing to rock to get into standing today) and min guard to ambulate to bathroom. Patient min guard/supervision for toileting and grooming. Patient's o2 sats fluctuated between 88-92% on 10 L trach collar. Patient able to stand approx 8 minutes at sink for grooming tasks - leaning onto sink for support. Patient returned to bed at end of treatment reporting she is tired and not feeling well tired. Needs continued OT services to progress to modified independence.   Follow Up Recommendations  Home health OT;Supervision - Intermittent    Equipment Recommendations  3 in 1 bedside commode    Recommendations for Other Services      Precautions / Restrictions Precautions Precautions: Fall Precaution Comments: trach collar, G-tube Restrictions Weight Bearing Restrictions: No       Mobility Bed Mobility Overal bed mobility: Modified Independent             General bed mobility comments: Mod I - increased time and use of bed rail.  Transfers Overall transfer level: Needs assistance Equipment used: Rolling walker (2  wheeled) Transfers: Sit to/from Stand Sit to Stand: Min guard Stand pivot transfers: Min guard       General transfer comment: Patient needed to perform rocking motion to perform sit to stand. min guard for ambulation and sit to stands. Assistance for line management.    Balance Overall balance assessment: Mild deficits observed, not formally tested                                         ADL either performed or assessed with clinical judgement   ADL       Grooming: Wash/dry hands;Wash/dry face;Oral care;Standing;Brushing hair Grooming Details (indicate cue type and reason): Patient performed grooming tasks at sink. Patient propping on sink counter for support. Patient stood at sink approx 8 minutes to complete tasks.                 Toilet Transfer: Nature conservation officer;Ambulation;RW Toilet Transfer Details (indicate cue type and reason): Ambulated to bathroom on 10 L trach collar. Min guard for transfer. Patient needed to rock back and forth for sit to stand. Toileting- Clothing Manipulation and Hygiene: Supervision/safety;Sitting/lateral lean Toileting - Clothing Manipulation Details (indicate cue type and reason): Patient able to manage hospital gown and pericare.             Vision Wears Glasses: Reading only Patient Visual Report: No change from baseline     Perception     Praxis      Cognition Arousal/Alertness: Awake/alert Behavior During Therapy:  WFL for tasks assessed/performed Overall Cognitive Status: Within Functional Limits for tasks assessed                                          Exercises     Shoulder Instructions       General Comments      Pertinent Vitals/ Pain       Pain Assessment: Faces Faces Pain Scale: Hurts a little bit Pain Location: "butt" Pain Descriptors / Indicators: Grimacing Pain Intervention(s): Monitored during session  Home Living                                           Prior Functioning/Environment              Frequency  Min 2X/week        Progress Toward Goals  OT Goals(current goals can now be found in the care plan section)  Progress towards OT goals: Progressing toward goals  Acute Rehab OT Goals Patient Stated Goal: to go home OT Goal Formulation: With patient Time For Goal Achievement: 04/11/20 Potential to Achieve Goals: Good  Plan Discharge plan remains appropriate    Co-evaluation          OT goals addressed during session: ADL's and self-care      AM-PAC OT "6 Clicks" Daily Activity     Outcome Measure   Help from another person eating meals?: None Help from another person taking care of personal grooming?: None Help from another person toileting, which includes using toliet, bedpan, or urinal?: A Little Help from another person bathing (including washing, rinsing, drying)?: A Little Help from another person to put on and taking off regular upper body clothing?: A Little Help from another person to put on and taking off regular lower body clothing?: A Little 6 Click Score: 20    End of Session Equipment Utilized During Treatment: Oxygen;Rolling walker  OT Visit Diagnosis: Unsteadiness on feet (R26.81);Muscle weakness (generalized) (M62.81);Pain   Activity Tolerance Patient tolerated treatment well   Patient Left in bed;with call bell/phone within reach   Nurse Communication Mobility status        Time: 5436-0677 OT Time Calculation (min): 31 min  Charges: OT General Charges $OT Visit: 1 Visit OT Treatments $Self Care/Home Management : 23-37 mins  Ayrton Mcvay, OTR/L [place]  Office [phone] Pager: [place] 04/07/2020, 10:52 AM

## 2020-04-07 NOTE — Progress Notes (Signed)
  Speech Language Pathology Treatment: Dysphagia  Patient Details Name: Veronica Horton MRN: [id number] DOB: 08/02/1960 Today's Date: 04/07/2020 Time: 9485-4627 SLP Time Calculation (min) (ACUTE ONLY): 34 min  Assessment / Plan / Recommendation Clinical Impression  Today pt returned from XRT and reports increase in secretion amounts due to frequently being moved.  Secretions are viscous and blood tinged but fortunately pt can largely clear most of them.   Pt observed consuming thin water from a bottle, delayed coughing with expectoration of secretions noted. Pt with copious coughing with secretions at baseline therefore can not determine if cough could be aspiration related.  Pt denies having sensation of food/drink entering airway.   Since cough is strong and vitals are stable, recommend continue intake.    She has required lavage, bagging and suction on 04/05/2020 per review of chart.  In light of this occurence and pt being toward end of hall, requested she have a bell to use for respiratory emergencies.   Charge RN provided pt with bell and pt understands it is for respiratory emergencies only.    Pt agreeable to initiate a single exercises today, lingual press to improve laryngeal closure for airway protection with intake demonstrated with pt using return demonstration.  Advised pt conduct exercise for 3 seconds x20 repetitions - TID.  Offered to write down exercise but pt politely declined.    Recommend strict aspiration precautions and using teach back, informed pt to importance of continuing water intake.  Recommended caution with other intake due to large amount of secretions potentially impacting airway protection with po negatively.    Given sensorimotor deficits that may occur with chemorad, monitoring vitals closely is indicated to help with determining po tolerance.  She advised understanding to information.     HPI HPI: Veronica Horton is a 59 y/o F with history of COPD,chronic  respiratory failure on home oxygen at 5 L/min, with large laryngeal mass esion, now s/p tracheostomy with Shiley 6-0 and direct laryngoscopy and biopsy. 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  Pt is also s/p PEG for nutrition.  Plans are for chemo and radiation concurrently starting while in hospital.  Prior medical history includes smoking and some dyspnea if walking long distanced.  Pt underwent MBS today and follow up completed to educate her to recommendations.  She has been on a dys3/thin diet and follow up indicated to provide information and compensations for potential side effects of radiation and compensation strategies.      SLP Plan  Continue with current plan of care       Recommendations  Diet recommendations: Thin liquid (solids with strict precautions) Liquids provided via: Straw;Cup Medication Administration: Via alternative means (or with puree) Supervision: Patient able to self feed Compensations: Slow rate;Small sips/bites;Follow solids with liquid Postural Changes and/or Swallow Maneuvers: Seated upright 90 degrees;Upright 30-60 min after meal                Oral Care Recommendations: Oral care BID Follow up Recommendations: Skilled Nursing facility SLP Visit Diagnosis: Dysphagia, pharyngoesophageal phase (R13.14) Plan: Continue with current plan of care       GO                Veronica Horton 04/07/2020, 4:53 PM

## 2020-04-07 NOTE — Progress Notes (Signed)
[facility]  PROGRESS NOTE  [place] [name] DOB: November 28, 1960 DOA: 03/10/2020 PCP: [facility] date - 03/10/2020   Admitting Physician Albertine Patricia, MD  Outpatient Primary MD for the patient is [facility] [place] is a 59 y.o. year old female with medical history significant for COPD, chronic hypoxic respiratory failure on 5 L O2, hiatal hernia, morbid obesity who presented on 03/10/2020 with shortness of breath thus far have COPD exacerbation.  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  Has been at [facility] long since 11/17 radiation therapy, also had PEG tube placed on 11/22.  Currently receiving chemoradiation.    Subjective  Today states she still having lower abdomen pain and feels full when she tries tube feeds  A & P  Abdominal pain, and lower quadrant, suspect constipation given reports of diminished bowel movements of the past 3 days with minimal improvement using enema PEG tube site looks stable.  Abdominal x-ray shows nonobstructive gas pattern -Optimize bowel regimen continue Colace twice daily, add MiraLAX twice daily -Continue  PPI -Check LFTs in a.m.  Squamous cell laryngeal cancer, status post tracheostomy and PEG placement.  Patient declined total laryngectomy -Continue weekly cisplatin per oncology -Receiving radiation  Acute on chronic hypoxic/hypercarbic respiratory failure.  Likely multifactorial etiology including COPD further exacerbated by laryngeal squamous cell carcinoma as well aspiration pneumonia. -On size 6 uncuffed Shiley placed by PCCM on 12/2 -Still requiring 2 L via trach -Encourage incentive spirometry  COPD, no wheezing -Continue inhaler regimen  Hypoglycemia.  Closely monitor, patient declining further tube feeds  given abdominal pain. -Continue close monitor CBG on insulin regimen  GERD-continue PPI     Family Communication  : None at bedside  Code Status : Full  Disposition Plan  :  Patient is from home. Anticipated d/c date: 2 to 3 days. Barriers to d/c or necessity for inpatient status:  Still requiring fairly high amounts of oxygen currently at 10 L trach support, case management assisting with disposition patient will likely need home health therapy at minimum Consults  :    Procedures  :    DVT Prophylaxis  :  Lovenox  MDM: The below labs and imaging reports were reviewed and summarized above.  Medication management as above.  Lab Results  Component Value Date   PLT 177 04/07/2020    Diet :  Diet Order            Diet regular Room service appropriate? Yes; Fluid consistency: Thin  Diet effective now                  Inpatient Medications Scheduled Meds: . arformoterol  15 mcg Nebulization BID  . budesonide (PULMICORT) nebulizer solution  0.5 mg Nebulization BID  . chlorhexidine  15 mL Mouth Rinse BID  . docusate  100 mg Per Tube BID  . enoxaparin (LOVENOX) injection  60 mg Subcutaneous Daily  . feeding supplement  237 mL Oral TID BM  . feeding supplement (OSMOLITE 1.5 CAL)  237 mL Per Tube TID BM  . gabapentin  600 mg Oral QHS  . insulin aspart  0-9 Units Subcutaneous Q4H  . ipratropium-albuterol  3 mL Nebulization BID  . ketotifen  1 drop Both Eyes BID  . lidocaine  1 patch Transdermal Q24H  . mouth rinse  15 mL Mouth Rinse q12n4p  . melatonin  5 mg Oral QHS  . multivitamin with minerals  1 tablet Per Tube Daily  . nicotine  21 mg Transdermal Daily  . pantoprazole sodium  40 mg Per Tube Daily  . scopolamine  1 patch Transdermal Q72H   Continuous Infusions: . sodium chloride     PRN Meds:.sodium chloride, acetaminophen (TYLENOL) oral liquid 160 mg/5 mL, bisacodyl, diclofenac Sodium, diphenhydrAMINE, glucagon, guaiFENesin-dextromethorphan, hydrOXYzine,  ipratropium-albuterol, lidocaine (PF), magic mouthwash w/lidocaine, oxyCODONE, polyethylene glycol, prochlorperazine **OR** prochlorperazine  Antibiotics  :   Anti-infectives (From admission, onward)   Start     Dose/Rate Route Frequency Ordered Stop   03/22/20 1700  vancomycin (VANCOCIN) IVPB 1000 mg/200 mL premix  Status:  Discontinued        1,000 mg 200 mL/hr over 60 Minutes Intravenous  Once 03/22/20 1605 03/23/20 0236   03/22/20 1604  vancomycin (VANCOCIN) 1-5 GM/200ML-% IVPB       Note to Pharmacy: [name]   : cabinet override      03/22/20 1604 03/22/20 1615   03/14/20 1100  levofloxacin (LEVAQUIN) IVPB 750 mg        750 mg 100 mL/hr over 90 Minutes Intravenous Daily 03/14/20 0955 03/19/20 1146   03/10/20 1730  doxycycline (VIBRAMYCIN) 100 mg in sodium chloride 0.9 % 250 mL IVPB  Status:  Discontinued        100 mg 125 mL/hr over 120 Minutes Intravenous Every 12 hours 03/10/20 1634 03/14/20 0955   03/10/20 1600  azithromycin (ZITHROMAX) 500 mg in sodium chloride 0.9 % 250 mL IVPB  Status:  Discontinued        500 mg 250 mL/hr over 60 Minutes Intravenous Every 24 hours 03/10/20 1529 03/10/20 1538   03/10/20 1500  clindamycin (CLEOCIN) IVPB 600 mg  Status:  Discontinued        600 mg 100 mL/hr over 30 Minutes Intravenous  Once 03/10/20 1458 03/10/20 1521       Objective   Vitals:   04/07/20 1205 04/07/20 1514 04/07/20 1928 04/07/20 1959  BP: (!) 96/55   136/65  Pulse: 62 74 70 80  Resp: 18 18 16 18   Temp: 99.5 F (37.5 C)   99.3 F (37.4 C)  TempSrc: Oral     SpO2: (!) 88% 91% 96% 95%  Weight:      Height:        SpO2: 95 % O2 Flow Rate (L/min): 10 L/min FiO2 (%): 60 %  Wt Readings from Last 3 Encounters:  04/06/20 118.7 kg  01/24/20 111.8 kg  01/30/19 (!) 136.3 kg     Intake/Output Summary (Last 24 hours) at 04/07/2020 2137 Last data filed at 04/07/2020 1717 Gross per 24 hour  Intake 480 ml  Output 800 ml  Net -320 ml    Physical  Exam:     Awake Alert, Oriented X 3, flat affect No new F.N deficits,  [place].AT, Normal respiratory effort on trach collar, CTAB RRR,No Gallops,Rubs or new Murmurs,  Abd Soft, tender in lower quadrant,no tenderness around PEG tube No Cyanosis, No new Rash or bruise    I have personally reviewed the following:   Data Reviewed:  CBC Recent Labs  Lab 04/02/20 1446 04/05/20 0517 04/07/20 0800  WBC 5.5 5.8 7.4  HGB 12.0 12.0 10.9*  HCT 37.6 37.5 33.9*  PLT 149* 171 177  MCV 99.5 98.7 98.3  MCH 31.7 31.6 31.6  MCHC 31.9 32.0 32.2  RDW 14.6 14.3 14.3  LYMPHSABS 0.7 1.0 0.9  MONOABS 0.5 0.6 0.8  EOSABS 0.1 0.1 0.0  BASOSABS 0.0 0.0 0.0    Chemistries  Recent Labs  Lab 04/02/20 1446 04/05/20 0517 04/07/20 0800  NA 135 137 134*  K 4.1 4.4 4.4  CL 99 99 99  CO2 28 29 27   GLUCOSE 101* 89 126*  BUN 20 14 34*  CREATININE 0.99 1.00 1.36*  CALCIUM 8.5* 8.5* 8.3*  MG  --  2.1 2.3  AST 15  --   --   ALT 18  --   --   ALKPHOS 61  --   --   BILITOT 0.6  --   --    ------------------------------------------------------------------------------------------------------------------ No results for input(s): CHOL, HDL, LDLCALC, TRIG, CHOLHDL, LDLDIRECT in the last 72 hours.  Lab Results  Component Value Date   HGBA1C 5.4 03/15/2020   ------------------------------------------------------------------------------------------------------------------ No results for input(s): TSH, T4TOTAL, T3FREE, THYROIDAB in the last 72 hours.  Invalid input(s): FREET3 ------------------------------------------------------------------------------------------------------------------ No results for input(s): VITAMINB12, FOLATE, FERRITIN, TIBC, IRON, RETICCTPCT in the last 72 hours.  Coagulation profile No results for input(s): INR, PROTIME in the last 168 hours.  No results for input(s): DDIMER in the last 72 hours.  Cardiac Enzymes No results for input(s): CKMB, TROPONINI, MYOGLOBIN in the  last 168 hours.  Invalid input(s): CK ------------------------------------------------------------------------------------------------------------------    Component Value Date/Time   BNP 64.6 03/29/2020 0515    Micro Results Recent Results (from the past 240 hour(s))  Culture, respiratory (non-expectorated)     Status: None   Collection Time: 04/02/20  3:36 PM   Specimen: Tracheal Aspirate; Respiratory  Result Value Ref Range Status   Specimen Description   Final    TRACHEAL ASPIRATE Performed at [facility] [address] [place], [place] [address]    Special Requests   Final    NONE Performed at [facility], [place] [address] [place], [place] [address]    Gram Stain   Final    RARE WBC PRESENT, PREDOMINANTLY PMN RARE GRAM POSITIVE COCCI RARE GRAM NEGATIVE RODS Performed at [facility], [place] [address], [place], [place] [address]    Culture MODERATE SERRATIA MARCESCENS  Final   Report Status 04/05/2020 FINAL  Final   Organism ID, Bacteria SERRATIA MARCESCENS  Final      Susceptibility   Serratia marcescens - MIC*    CEFAZOLIN >=64 RESISTANT Resistant     CEFEPIME <=0.12 SENSITIVE Sensitive     CEFTAZIDIME <=1 SENSITIVE Sensitive     CEFTRIAXONE <=0.25 SENSITIVE Sensitive     CIPROFLOXACIN <=0.25 SENSITIVE Sensitive     GENTAMICIN <=1 SENSITIVE Sensitive     TRIMETH/SULFA <=20 SENSITIVE Sensitive     * MODERATE SERRATIA MARCESCENS    Radiology Reports CT SOFT TISSUE NECK W CONTRAST  Addendum Date: 03/11/2020   ADDENDUM REPORT: 03/11/2020 16:36 ADDENDUM: Study discussed by telephone with Dr. Jolaine Artist MEMON on 03/11/2020 at 1631 hours. Electronically Signed   By: Genevie Ann M.D.   On: 03/11/2020 16:36   Result Date: 03/11/2020 CLINICAL DATA:  59 year old female with dysphagia. Tonsillitis suspected. EXAM: CT NECK WITH CONTRAST TECHNIQUE: Multidetector CT imaging of the neck was performed using the standard protocol following  the bolus administration of intravenous contrast. CONTRAST:  34mL OMNIPAQUE IOHEXOL 300 MG/ML  SOLN COMPARISON:  Chest CT 01/28/2019. FINDINGS: Pharynx and larynx: Bulky soft tissue mass throughout the bilateral supraglottic larynx and affecting the hypopharynx. Lobulated diffuse thickening of the epiglottis (series 2, image  18). Diffuse false cord and anterior commissure involvement with evidence of extension into the right paraglottic space on series 2 image 61 - where nodular direct extension of tumor and/or inseparable abnormal right level 3 lymph node protrudes on coronal image 58. Asymmetric erosion of the undersurface of the right thyroid cartilage on image 65. Asymmetric sclerosis of the right arytenoid on image 67., and midline extension of tumor is suspected through the anterior commissure a distance of about 11 mm as seen on series 2, image 67 and sagittal image 48. All told, tumor size is estimated at 37 x 52 by 45 mm (AP by transverse by CC). There is evidence of airway compromise. The true cords may be spared as seen on series 2, image 71. Subglottic larynx is within normal limits. Above the vallecula pharyngeal contours appear normal. Normal superior parapharyngeal and retropharyngeal spaces. Salivary glands: Negative sublingual space. Submandibular glands and parotid glands remain within normal limits. Thyroid: Negative. Lymph nodes: Malignant left level IIIb lymph node measures 17 mm short axis and 29 mm long axis (series 2, image 55 and coronal image 73). As stated above it is possible of malignant right level IIIa lymph node is inseparable from the parent tumor along the right paraglottic space (coronal image 58). Smaller but asymmetrically enlarged left level 4 nodes measure up to 9 mm short axis (coronal image 72). No other abnormal or suspicious lymph nodes identified. Vascular: Major vascular structures in the neck and at the skull base remain patent including both internal jugular veins. The  left vertebral artery appears dominant. Calcified atherosclerosis at the skull base. Limited intracranial: Negative. Visualized orbits: Negative. Mastoids and visualized paranasal sinuses: Clear. Skeleton: Absent and carious dentition. Cervical spine degeneration. No suspicious osseous lesion identified. 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 No superior mediastinal lymphadenopathy. Visible axillary lymph nodes are normal. Mild dependent atelectasis.  No upper lung nodule identified. IMPRESSION: 1. 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 Recommend ENT consultation. Involvement of right laryngeal cartilages, with extension in the midline anterior to the strap muscles, and also extension through the right paraglottic space and/or inseparable malignant right level IIIa lymph node. Estimated tumor long axis 5.2 cm. 2. Malignant contralateral left level 3b lymph node is 2.9 cm long axis. Smaller indeterminate left level 4 lymph nodes. 3. No distant metastatic disease identified in the neck or upper chest. Electronically Signed: By: Genevie Ann M.D. On: 03/11/2020 16:23   IR GASTROSTOMY TUBE MOD SED  Result Date: 03/22/2020 INDICATION: Head and neck malignancy, dysphagia EXAM: FLUOROSCOPIC 16 FRENCH BALLOON RETENTION GASTROSTOMY MEDICATIONS: 1 G VANCOMYCIN; Antibiotics were administered within 1 hour of the procedure. GLUCAGON 0.5 MG IV ANESTHESIA/SEDATION: Versed 0 mg IV; Fentanyl 50 mcg IV Moderate Sedation Time:  12 MINUTES The patient was continuously monitored during the procedure by the interventional radiology nurse under my direct supervision. CONTRAST:  10 CC-administered into the gastric lumen. FLUOROSCOPY TIME:  Fluoroscopy Time: 1 minutes 36 seconds (53 mGy). COMPLICATIONS: None immediate. PROCEDURE: Informed written consent was obtained from the patient after a thorough discussion of the procedural risks, benefits and alternatives. All questions  were addressed. Maximal Sterile Barrier Technique was utilized including caps, mask, sterile gowns, sterile gloves, sterile drape, hand hygiene and skin antiseptic. A timeout was performed prior to the initiation of the procedure. Previous imaging reviewed. Patient has an existing feeding tube within the stomach. This was utilized to insufflate the stomach with air. The stomach was localized beneath the left subcostal  margin in the left abdomen with biplane fluoroscopy. Overlying skin marked. Under sterile conditions and local anesthesia, introducer needle was advanced into the stomach. Contrast injection confirms percutaneous needle access within the stomach. Single T tack deployed for gastropexy. Amplatz guidewire inserted. Tract dilatation performed to insert the 16 French balloon retention gastrostomy. Balloon tip inflated with 7 cc mixture of saline and 1 cc contrast. This was retracted against the anterior gastric wall. Contrast injection confirms position in the stomach. Images obtained for documentation. T tacks secured to the skin site. A sterile dressing applied. No immediate complication. Patient tolerated the procedure well. IMPRESSION: Successful fluoroscopic 8 French balloon retention gastrostomy Electronically Signed   By: Jerilynn Mages.  Shick M.D.   On: 03/22/2020 16:53   CT CHEST ABDOMEN PELVIS W CONTRAST  Result Date: 03/15/2020 CLINICAL DATA:  New diagnosis of head neck mass EXAM: CT CHEST, ABDOMEN, AND PELVIS WITH CONTRAST TECHNIQUE: Multidetector CT imaging of the chest, abdomen and pelvis was performed following the standard protocol during bolus administration of intravenous contrast. CONTRAST:  158mL OMNIPAQUE IOHEXOL 300 MG/ML  SOLN COMPARISON:  Prior swallowing evaluation and CT of the neck. FINDINGS: CT CHEST FINDINGS Cardiovascular: Calcified coronary artery disease. Aorta is normal caliber. 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 Central pulmonary  vasculature is mildly engorged. Approximately 3 cm greatest caliber unchanged from previous exam. Unremarkable on limited venous phase assessment. Mediastinum/Nodes: Supraglottic mass partially imaged on the first image of the data set. Interval placement of tracheostomy tube with resultant pneumomediastinum in the anterior mediastinum in upper mediastinum. No adenopathy within the mediastinum or axilla. No hilar adenopathy. Rounded partial necrotic lymph node in the LEFT neck partially visualized, corresponding to level IIIb lymph node seen on the neck CT. Lungs/Pleura: Basilar consolidative changes bilaterally with similar pattern of consolidation though decreased volume loss when compared to the study of September of 2020. Airways are patent. Musculoskeletal: See below for full musculoskeletal detail. CT ABDOMEN PELVIS FINDINGS Hepatobiliary: Liver without focal lesion. Post cholecystectomy. No biliary duct dilation. Pancreas: Mild atrophy of the pancreas without focal lesion, ductal dilation or inflammation. Spleen: Spleen normal in size and contour. Adrenals/Urinary Tract: Adrenal glands are normal. Symmetric renal enhancement. No hydronephrosis. LEFT renal cysts, largest arising from the lower pole measuring 2.8 x 2.9 cm. Urinary bladder under distended limiting assessment. Stomach/Bowel: Gastrointestinal tract with signs of colonic diverticulosis. No acute bowel process. Rectus diastasis. Postoperative changes in the midline of the abdomen related to prior ventral hernia repair. Mild bulging at the site of hernia repair, no herniation beyond the inserted mesh near the umbilicus. Vascular/Lymphatic: Calcified atheromatous plaque in the abdominal aorta. No aneurysmal dilation. There is no gastrohepatic or hepatoduodenal ligament lymphadenopathy. No retroperitoneal or mesenteric lymphadenopathy. No pelvic sidewall lymphadenopathy. Reproductive: No adnexal mass. Reproductive structures are unremarkable. Other:  Post abdominal wall reconstruction with rectus diastasis, no frank hernia. Musculoskeletal: Spinal degenerative changes. Degenerative changes in glenohumeral joints and hips. Ununited fractures along posterior LEFT chest involving ribs 10 through 12. These appear subacute or chronic. These did not appear to be present on previous imaging from September of 2020. IMPRESSION: 1. Supraglottic mass partially imaged on the first image of the data set. No signs of metastatic disease to the chest, abdomen or pelvis. 2. Rounded partially necrotic lymph node in the LEFT neck partially visualized, corresponding to level IIIb lymph node seen on the neck CT. 3. Basilar consolidative changes bilaterally with similar pattern of consolidation though decreased volume loss when compared to  the study of September of 2020. Findings may be related to aspiration. No definite lesions seen in the chest though these areas could obscure underlying lesions. 4. Postoperative changes with small amount of pneumomediastinum presumably related to recent tracheostomy tube insertion. 5. Ununited fractures along posterior LEFT chest involving ribs 10 through 12. These appear subacute or chronic. These did not appear to be present on previous imaging from September of 2020. 6. Post abdominal wall reconstruction with rectus diastasis, no frank hernia. 7. Aortic atherosclerosis. Aortic Atherosclerosis (ICD10-I70.0). Electronically Signed   By: Zetta Bills M.D.   On: 03/15/2020 10:53   DG CHEST PORT 1 VIEW  Result Date: 04/03/2020 CLINICAL DATA:  Increasing shortness of breath EXAM: PORTABLE CHEST 1 VIEW COMPARISON:  03/31/2020 FINDINGS: Tracheostomy tube is noted in satisfactory position. Cardiac shadow is enlarged but stable. Some persistent bibasilar opacities are noted although mildly improved when compared with the prior exam. No new focal abnormality is noted. IMPRESSION: Improved aeration as described. Electronically Signed   By: Inez Catalina M.D.   On: 04/03/2020 11:59   DG CHEST PORT 1 VIEW  Result Date: 03/31/2020 CLINICAL DATA:  Pleural effusion EXAM: PORTABLE CHEST 1 VIEW COMPARISON:  03/29/2020 FINDINGS: Tracheostomy unchanged. Lung volumes are small, however, pulmonary insufflation remains stable since prior examination. Bilateral pleural effusions are present small and decreased in size since prior examination. Ovoid opacity within the right mid lung zone likely represents fluid within the fissure. There is bibasilar atelectasis. Cardiac size is within normal limits. No acute bone abnormality. IMPRESSION: Slight interval decrease in small bilateral pleural effusions with associated bibasilar atelectasis. Pulmonary hypoinflation. Electronically Signed   By: Fidela Salisbury MD   On: 03/31/2020 05:41   DG CHEST PORT 1 VIEW  Result Date: 03/29/2020 CLINICAL DATA:  Dyspnea EXAM: PORTABLE CHEST 1 VIEW COMPARISON:  03/25/2020 FINDINGS: Tracheostomy unchanged. Pulmonary insufflation is symmetric though has decreased slightly since prior examination. Small bilateral pleural effusions have developed with associated bibasilar atelectasis. No pneumothorax. Cardiac size within normal limits. Pulmonary vascularity is normal. No acute bone abnormality. IMPRESSION: Interval development of small bilateral pleural effusions with bibasilar compressive atelectasis. Electronically Signed   By: Fidela Salisbury MD   On: 03/29/2020 06:00   DG CHEST PORT 1 VIEW  Result Date: 03/25/2020 CLINICAL DATA:  Hypoxia, shortness of breath, altered mental status history COPD, hypertension, history laryngeal carcinoma, tracheostomy, undergoing radiation therapy EXAM: PORTABLE CHEST 1 VIEW COMPARISON:  Portable exam 5170 hours compared to 03/23/2020 FINDINGS: Tracheostomy tube projects over tracheal air column. Normal heart size, mediastinal contours, and pulmonary vascularity. Improving opacity at LEFT lung base which could represent atelectasis or consolidation.  Subsegmental atelectasis RIGHT middle lobe slightly improved. Tiny LEFT pleural effusion. Remaining lungs clear. No pneumothorax. Bones demineralized. IMPRESSION: Persistent subsegmental atelectasis at RIGHT base with improving atelectasis versus consolidation and tiny pleural effusion at LEFT base. Electronically Signed   By: Lavonia Dana M.D.   On: 03/25/2020 10:33   DG CHEST PORT 1 VIEW  Result Date: 03/23/2020 CLINICAL DATA:  Hypoxia. EXAM: PORTABLE CHEST 1 VIEW COMPARISON:  Left 19 2021. FINDINGS: Interim removal of feeding tube. Tracheostomy tube in stable position. Cardiomegaly. Persistent bibasilar atelectasis/infiltrates and small bilateral pleural effusions. Density noted over the right mid chest may represent atelectasis and or pleural fluid pseudotumor, no interim change. IMPRESSION: 1. Interim removal of feeding tube. Tracheostomy tube in stable position. 2. Stable cardiomegaly. 3. Persistent bibasilar atelectasis/infiltrates and small bilateral pleural effusions. Density of the right mid chest may  represent atelectasis and or pleural fluid pseudotumor, no interim change. Electronically Signed   By: Marcello Moores  Register   On: 03/23/2020 06:39   DG CHEST PORT 1 VIEW  Result Date: 03/19/2020 CLINICAL DATA:  Pneumonia. EXAM: PORTABLE CHEST 1 VIEW COMPARISON:  03/18/2020. FINDINGS: Tracheostomy tube, feeding tube in stable position. Stable cardiomegaly. Improved pulmonary venous congestion. Lung volumes. Persistent left base atelectasis/infiltrate. Persistent right base subsegmental atelectasis. Elliptical density noted over the right mid chest may represent atelectasis and or fissural fluid. Pleural effusions cannot be excluded. No pneumothorax. IMPRESSION: 1. Lines and tubes in stable position. 2. Stable cardiomegaly. Improved pulmonary venous congestion. 3. Persistent left base atelectasis/infiltrate. Persistent right base subsegmental atelectasis. Elliptical density noted over the right mid chest  may represent atelectasis and or fissural fluid. Small pleural effusions cannot be excluded. Electronically Signed   By: Marcello Moores  Register   On: 03/19/2020 06:03   DG Chest Port 1 View  Result Date: 03/18/2020 CLINICAL DATA:  Respiratory failure. EXAM: PORTABLE CHEST 1 VIEW COMPARISON:  03/17/2020. FINDINGS: Tracheostomy tube and feeding tube in stable position. Cardiomegaly. Mild pulmonary venous congestion. Mild bilateral interstitial prominence. Mild component of CHF cannot be excluded. Persistent left lower lobe atelectasis/infiltrate. New onset right mid lung prominent atelectatic changes. No pneumothorax. IMPRESSION: 1. Tracheostomy tube and feeding tube in stable position. 2. Cardiomegaly with mild pulmonary venous congestion and mild bilateral interstitial prominence. Mild component of CHF cannot be excluded. 3. Persistent left lower lobe atelectasis/infiltrate. New onset of right mid lung prominent atelectatic changes. Electronically Signed   By: Marcello Moores  Register   On: 03/18/2020 07:34   DG CHEST PORT 1 VIEW  Result Date: 03/17/2020 CLINICAL DATA:  Respiratory failure EXAM: PORTABLE CHEST 1 VIEW COMPARISON:  March 14, 2020 FINDINGS: Tracheostomy catheter tip is 6.2 cm above the carina. Feeding tube tip is below the diaphragm. No pneumothorax. There is a small left pleural effusion with consolidation in the left lower lobe. There is right base atelectasis with equivocal small right pleural effusion. Heart is mildly enlarged, stable, with pulmonary vascularity normal. No adenopathy. No bone lesions. IMPRESSION: Tube positions as described without pneumothorax. Left pleural effusion with equivocal right pleural effusion. Airspace opacity consistent with combination of atelectasis and pneumonia left lower lobe. Mild atelectasis right base. Stable cardiac prominence. Electronically Signed   By: Lowella Grip III M.D.   On: 03/17/2020 09:02   DG CHEST PORT 1 VIEW  Result Date:  03/14/2020 CLINICAL DATA:  Respiratory failure with hypercapnia EXAM: PORTABLE CHEST 1 VIEW COMPARISON:  March 10, 2020 FINDINGS: Tracheostomy catheter tip is 6.2 cm above the carina. There is ill-defined airspace opacity in the left upper lobe and left base regions with equivocal left pleural effusion. The right lung is clear. Heart is upper normal in size with pulmonary vascularity normal. No adenopathy. No bone lesions. IMPRESSION: Tracheostomy as described without pneumothorax. Patchy airspace opacity consistent with scattered areas of pneumonia in the left upper lobe and left base. Equivocal left pleural effusion. Right lung clear. Stable cardiac silhouette. Electronically Signed   By: Lowella Grip III M.D.   On: 03/14/2020 09:17   DG Chest Portable 1 View  Result Date: 03/10/2020 CLINICAL DATA:  Shortness of breath and cough EXAM: PORTABLE CHEST 1 VIEW COMPARISON:  January 23, 2020 FINDINGS: There is bibasilar atelectatic change. Elsewhere the interstitium is mildly thickened. No consolidation. Heart size and pulmonary vascularity are normal. No adenopathy. There is degenerative change in each shoulder. IMPRESSION: Bibasilar atelectasis. Interstitial thickening likely represents a  degree of underlying chronic bronchitis. No edema or airspace opacity. Heart size within normal limits. Electronically Signed   By: Lowella Grip III M.D.   On: 03/10/2020 12:42   DG Abd Portable 1V  Result Date: 04/07/2020 CLINICAL DATA:  Abdomen pain EXAM: PORTABLE ABDOMEN - 1 VIEW COMPARISON:  CT 03/15/2020 FINDINGS: Gastrostomy tube in the left mid abdomen. Nonobstructed bowel-gas pattern. Evidence of prior hernia repair. Curvilinear densities in the right lower quadrant, possible calcifications or retained contrast within appendix. IMPRESSION: Nonobstructed gas pattern. Electronically Signed   By: Donavan Foil M.D.   On: 04/07/2020 21:16   DG Swallowing Func-Speech Pathology  Result Date:  03/27/2020 Objective Swallowing Evaluation: Type of Study: MBS-Modified Barium Swallow Study  Patient Details Name: TAMARAH BHULLAR MRN: [id number] Date of Birth: 1961-03-23 Today's Date: 03/27/2020 Time: SLP Start Time (ACUTE ONLY): 3474 -SLP Stop Time (ACUTE ONLY): 1235 SLP Time Calculation (min) (ACUTE ONLY): 20 min Past Medical History: Past Medical History: Diagnosis Date . Bronchitis  . Class 3 obesity 01/23/2020 . COPD (chronic obstructive pulmonary disease) ([place])  . Degenerative disc disease, lumbar  . Hiatal hernia  . Hypertension  Past Surgical History: Past Surgical History: Procedure Laterality Date . CHOLECYSTECTOMY   . degenerative bone disease   . DIRECT LARYNGOSCOPY N/A 03/13/2020  Procedure: DIRECT LARYNGOSCOPY WITH BIOPSY;  Surgeon: Jason Coop, DO;  Location: [facility];  Service: ENT;  Laterality: N/A; . INCISIONAL HERNIA REPAIR N/A 01/15/2019  Procedure: [name] HERNIORRHAPHY WITH MESH;  Surgeon: [name], MD;  Location: [facility];  Service: General;  Laterality: N/A; . IR GASTROSTOMY TUBE MOD SED  03/22/2020 . OMENTECTOMY N/A 01/15/2019  Procedure: OMENTECTOMY;  Surgeon: [name], MD;  Location: [facility];  Service: General;  Laterality: N/A; . TRACHEOSTOMY TUBE PLACEMENT N/A 03/13/2020  Procedure: AWAKE TRACHEOSTOMY;  Surgeon: Jason Coop, DO;  Location: [facility];  Service: ENT;  Laterality: N/A; HPI: Tamilyn Lupien is a 59 y/o F with history of COPD,chronic respiratory failure on home oxygen at 5 L/min, with large laryngeal mass lesion, now s/p tracheostomy with Shiley 6-0 and direct laryngoscopy and biopsy. 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  Pt is also s/p PEG for nutrition.  Plans are for chemo and radiation concurrently starting while in hospital.  Prior medical history includes smoking and some dyspnea if walking long distanced.  Subjective: pt awake in chair Assessment / Plan / Recommendation CHL IP CLINICAL IMPRESSIONS 03/27/2020 Clinical  Impression Pt continues with with mild pharyngeal phase dysphagia - obstructive due to mass impacting epiglottic deflection.  In addition, she requires extra time to masticate solids due to dentition.  Swallow trigger is timely with minimal laryngeal penetration of liquid into larynx during the swallow due to decreased epiglottic deflection.  Min vallecular residue with solids without pt awareness - liquid wash assisted to clear.  Pt with prominent cricopharyngeus and appears with trace backflow near this region but view was suboptimal.  Recommend D3/mech soft and thin liquids, po medications whole in puree and follow with liquid wash.  Of note, following testing, pt did cough and slight whitish coating noted on secretions but no aspiration noted during testing.  Suspect pt has some low grade chronic secretion aspiration. SLP Visit Diagnosis Dysphagia, oropharyngeal phase (R13.12) Attention and concentration deficit following -- Frontal lobe and executive function deficit following -- Impact on safety and function Mild aspiration risk   CHL IP TREATMENT RECOMMENDATION 03/27/2020 Treatment Recommendations Therapy as outlined in treatment plan below   Prognosis  03/27/2020 Prognosis for Safe Diet Advancement Fair Barriers to Reach Goals Other (Comment) Barriers/Prognosis Comment -- CHL IP DIET RECOMMENDATION 03/27/2020 SLP Diet Recommendations Dysphagia 3 (Mech soft) solids;Thin liquid Liquid Administration via -- Medication Administration Whole meds with puree Compensations Slow rate;Small sips/bites Postural Changes --   CHL IP OTHER RECOMMENDATIONS 03/27/2020 Recommended Consults (No Data) Oral Care Recommendations Oral care BID Other Recommendations Clarify dietary restrictions   CHL IP FOLLOW UP RECOMMENDATIONS 03/27/2020 Follow up Recommendations Skilled Nursing facility   [facility] IP FREQUENCY AND DURATION 03/27/2020 Speech Therapy Frequency (ACUTE ONLY) min 1 x/week Treatment Duration 1 week      CHL IP ORAL PHASE  03/27/2020 Oral Phase WFL Oral - Pudding Teaspoon -- Oral - Pudding Cup -- Oral - Honey Teaspoon -- Oral - Honey Cup -- Oral - Nectar Teaspoon -- Oral - Nectar Cup -- Oral - Nectar Straw -- Oral - Thin Teaspoon -- Oral - Thin Cup -- Oral - Thin Straw -- Oral - Puree -- Oral - Mech Soft -- Oral - Regular -- Oral - Multi-Consistency -- Oral - Pill -- Oral Phase - Comment --  CHL IP PHARYNGEAL PHASE 03/27/2020 Pharyngeal Phase -- Pharyngeal- Pudding Teaspoon -- Pharyngeal -- Pharyngeal- Pudding Cup -- Pharyngeal -- Pharyngeal- Honey Teaspoon -- Pharyngeal -- Pharyngeal- Honey Cup -- Pharyngeal -- Pharyngeal- Nectar Teaspoon -- Pharyngeal -- Pharyngeal- Nectar Cup -- Pharyngeal -- Pharyngeal- Nectar Straw Reduced epiglottic inversion;Pharyngeal residue - valleculae Pharyngeal -- Pharyngeal- Thin Teaspoon Reduced epiglottic inversion;Pharyngeal residue - valleculae Pharyngeal -- Pharyngeal- Thin Cup Pharyngeal residue - valleculae;Reduced epiglottic inversion;Penetration/Aspiration during swallow Pharyngeal Material enters airway, remains ABOVE vocal cords then ejected out Pharyngeal- Thin Straw Reduced epiglottic inversion;Pharyngeal residue - valleculae;Penetration/Aspiration during swallow Pharyngeal Material enters airway, remains ABOVE vocal cords then ejected out Pharyngeal- Puree WFL Pharyngeal -- Pharyngeal- Mechanical Soft -- Pharyngeal -- Pharyngeal- Regular Reduced epiglottic inversion Pharyngeal -- Pharyngeal- Multi-consistency -- Pharyngeal -- Pharyngeal- Pill NT Pharyngeal -- Pharyngeal Comment --  CHL IP CERVICAL ESOPHAGEAL PHASE 03/27/2020 Cervical Esophageal Phase -- Pudding Teaspoon -- Pudding Cup -- Honey Teaspoon -- Honey Cup -- Nectar Teaspoon -- Nectar Cup -- Nectar Straw -- Thin Teaspoon -- Thin Cup Prominent cricopharyngeal segment Thin Straw -- Puree -- Mechanical Soft -- Regular -- Multi-consistency -- Pill -- Cervical Esophageal Comment appearance of potential minimal backflow of liquid near  UES region = suboptimal view - did not backflow into pharynx/larynx [name], MS [facility] SLP Acute Rehab Services Office [phone] Pager [phone] [name] 03/27/2020, 2:31 PM              DG Swallowing Func-Speech Pathology  Result Date: 03/11/2020 Objective Swallowing Evaluation: Type of Study: MBS-Modified Barium Swallow Study  Patient Details Name: DENEEN SLAGER MRN: [id number] Date of Birth: 04-11-1961 Today's Date: 03/11/2020 Time: SLP Start Time (ACUTE ONLY): 1255 -SLP Stop Time (ACUTE ONLY): 1316 SLP Time Calculation (min) (ACUTE ONLY): 21 min Past Medical History: Past Medical History: Diagnosis Date . Bronchitis  . Class 3 obesity 01/23/2020 . COPD (chronic obstructive pulmonary disease) ([place])  . Degenerative disc disease, lumbar  . Hiatal hernia  . Hypertension  Past Surgical History: Past Surgical History: Procedure Laterality Date . CHOLECYSTECTOMY   . degenerative bone disease   . INCISIONAL HERNIA REPAIR N/A 01/15/2019  Procedure: [name] HERNIORRHAPHY WITH MESH;  Surgeon: [name], MD;  Location: [facility];  Service: General;  Laterality: N/A; . OMENTECTOMY N/A 01/15/2019  Procedure: OMENTECTOMY;  Surgeon: [name], MD;  Location: [facility];  Service: General;  Laterality: N/A; HPI: Riana Tessmer  is a 59 y.o. female, with medical history significant of class III obesity, hiatal hernia, hypertension, bronchitis, COPD, chronic respiratory failure on home oxygen at 5 LPM, active smoker of 1-1/2 packs of cigarettes per day , patient presents to ED secondary to shortness of breath and altered mental status, patient with receptive worsening dyspnea over the last 4 days, she is not vaccinated against Covid, she is altered at the time of my examination, history was obtained from boyfriend at bedside, and ED staff, patient still smoking, she still using 5 L of oxygen, he is with increased work of breathing, significantly dyspneic, upon presentation to ED she is altered not provide any  complaints at this point. BSE requested.  Subjective: "I haven't been able to swallow my pills for a couple weeks." Assessment / Plan / Recommendation CHL IP CLINICAL IMPRESSIONS 03/11/2020 Clinical Impression Pt presents with mild pharyngeal phase dysphagia, however appearance of anatomy appears edematous (epiglottis and aryepiglottic folds). Pt with limited dentition and requires extra time to masticate solids, swallow trigger generally at the level of the valleculae, Pt with blunted appearance of epiglottis with reduced deflection resulting in variable trace, flash penetration of thins during the swallow without aspiration and min vallecular residue with solids and brief stasis of barium tablet in valleculae. Pt with prominent cricopharyngeus. Recommend D3/mech soft and thin liquids, po medications whole in puree and follow with liquid wash or per Pt preference. Also strongly recommend additional imaging (neck CT) and ENT consult pending those results. SLP will follow pending results of imaging. Above to RN and MD.  SLP Visit Diagnosis Dysphagia, oropharyngeal phase (R13.12) Attention and concentration deficit following -- Frontal lobe and executive function deficit following -- Impact on safety and function Mild aspiration risk   CHL IP TREATMENT RECOMMENDATION 03/11/2020 Treatment Recommendations No treatment recommended at this time   Prognosis 03/11/2020 Prognosis for Safe Diet Advancement Fair Barriers to Reach Goals Severity of deficits Barriers/Prognosis Comment -- CHL IP DIET RECOMMENDATION 03/11/2020 SLP Diet Recommendations Dysphagia 3 (Mech soft) solids;Thin liquid Liquid Administration via Cup;Straw Medication Administration Whole meds with puree Compensations Slow rate;Small sips/bites Postural Changes Remain semi-upright after after feeds/meals (Comment);Seated upright at 90 degrees   CHL IP OTHER RECOMMENDATIONS 03/11/2020 Recommended Consults Consider ENT evaluation Oral Care Recommendations Oral  care BID Other Recommendations Clarify dietary restrictions   CHL IP FOLLOW UP RECOMMENDATIONS 03/11/2020 Follow up Recommendations None   CHL IP FREQUENCY AND DURATION 03/11/2020 Speech Therapy Frequency (ACUTE ONLY) min 2x/week Treatment Duration 1 week      CHL IP ORAL PHASE 03/11/2020 Oral Phase WFL Oral - Pudding Teaspoon -- Oral - Pudding Cup -- Oral - Honey Teaspoon -- Oral - Honey Cup -- Oral - Nectar Teaspoon -- Oral - Nectar Cup -- Oral - Nectar Straw -- Oral - Thin Teaspoon -- Oral - Thin Cup -- Oral - Thin Straw -- Oral - Puree -- Oral - Mech Soft -- Oral - Regular -- Oral - Multi-Consistency -- Oral - Pill -- Oral Phase - Comment --  CHL IP PHARYNGEAL PHASE 03/11/2020 Pharyngeal Phase Impaired Pharyngeal- Pudding Teaspoon -- Pharyngeal -- Pharyngeal- Pudding Cup -- Pharyngeal -- Pharyngeal- Honey Teaspoon -- Pharyngeal -- Pharyngeal- Honey Cup -- Pharyngeal -- Pharyngeal- Nectar Teaspoon -- Pharyngeal -- Pharyngeal- Nectar Cup -- Pharyngeal -- Pharyngeal- Nectar Straw Pharyngeal residue - valleculae;Reduced epiglottic inversion Pharyngeal -- Pharyngeal- Thin Teaspoon Delayed swallow initiation-vallecula;Reduced epiglottic inversion Pharyngeal -- Pharyngeal- Thin Cup Reduced epiglottic inversion;Penetration/Aspiration during swallow Pharyngeal Material  enters airway, remains ABOVE vocal cords then ejected out Pharyngeal- Thin Straw Reduced epiglottic inversion;Penetration/Aspiration during swallow Pharyngeal Material enters airway, remains ABOVE vocal cords then ejected out Pharyngeal- Puree WFL Pharyngeal -- Pharyngeal- Mechanical Soft -- Pharyngeal -- Pharyngeal- Regular Pharyngeal residue - valleculae;Reduced epiglottic inversion Pharyngeal -- Pharyngeal- Multi-consistency -- Pharyngeal -- Pharyngeal- Pill Pharyngeal residue - valleculae;Reduced epiglottic inversion Pharyngeal -- Pharyngeal Comment edematous pharynx  CHL IP CERVICAL ESOPHAGEAL PHASE 03/11/2020 Cervical Esophageal Phase Impaired  Pudding Teaspoon -- Pudding Cup -- Honey Teaspoon -- Honey Cup -- Nectar Teaspoon -- Nectar Cup -- Nectar Straw -- Thin Teaspoon -- Thin Cup Prominent cricopharyngeal segment Thin Straw -- Puree -- Mechanical Soft -- Regular -- Multi-consistency -- Pill -- Cervical Esophageal Comment -- Thank you, [name], [address] [place] 03/11/2020, 2:12 PM              ECHOCARDIOGRAM COMPLETE  Result Date: 03/11/2020    ECHOCARDIOGRAM REPORT   Patient Name:   HENDRIX CONSOLE Date of Exam: 03/11/2020 Medical Rec #:  [id number]       Height:       65.0 in Accession #:    [id number]      Weight:       244.0 lb Date of Birth:  28-Apr-1961       BSA:          2.153 m Patient Age:    32 years        BP:           115/35 mmHg Patient Gender: F               HR:           57 bpm. Exam Location:  [facility] Procedure: 2D Echo Indications:    Dyspnea 786.09 / R06.00  History:        Patient has prior history of Echocardiogram examinations, most                 recent 01/16/2019. COPD; Risk Factors:Hypertension and Current                 Smoker. Acute respiratory failure with hypoxia.  Sonographer:    Leavy Cella RDCS (AE) Referring Phys: [id number] [address] [place] IMPRESSIONS  1. Left ventricular ejection fraction, by estimation, is 70 to 75%. The left ventricle has hyperdynamic function. The left ventricle has no regional wall motion abnormalities. There is mild left ventricular hypertrophy. Left ventricular diastolic parameters were normal.  2. Right ventricular systolic function is normal. The right ventricular size is normal. Tricuspid regurgitation signal is inadequate for assessing PA pressure.  3. The mitral valve is grossly normal. Trivial mitral valve regurgitation.  4. The aortic valve is tricuspid. Aortic valve regurgitation is not visualized.  5. The inferior vena cava is normal in size with greater than 50% respiratory variability, suggesting right atrial pressure of 3 mmHg. FINDINGS  Left  Ventricle: Left ventricular ejection fraction, by estimation, is 70 to 75%. The left ventricle has hyperdynamic function. The left ventricle has no regional wall motion abnormalities. The left ventricular internal cavity size was normal in size. There is mild left ventricular hypertrophy. Left ventricular diastolic parameters were normal. Right Ventricle: The right ventricular size is normal. No increase in right ventricular wall thickness. Right ventricular systolic function is normal. Tricuspid regurgitation signal is inadequate for assessing PA pressure. Left Atrium: Left atrial size was normal in size. Right Atrium: Right atrial size was normal in size. Pericardium: There  is no evidence of pericardial effusion. Mitral Valve: The mitral valve is grossly normal. Trivial mitral valve regurgitation. Tricuspid Valve: The tricuspid valve is grossly normal. Tricuspid valve regurgitation is trivial. Aortic Valve: The aortic valve is tricuspid. Aortic valve regurgitation is not visualized. Pulmonic Valve: The pulmonic valve was not well visualized. Pulmonic valve regurgitation is not visualized. Aorta: The aortic root is normal in size and structure. Venous: The inferior vena cava is normal in size with greater than 50% respiratory variability, suggesting right atrial pressure of 3 mmHg. IAS/Shunts: No atrial level shunt detected by color flow Doppler.  LEFT VENTRICLE PLAX 2D LVIDd:         4.05 cm  Diastology LVIDs:         2.30 cm  LV e' medial:    8.59 cm/s LV PW:         1.35 cm  LV E/e' medial:  12.2 LV IVS:        1.34 cm  LV e' lateral:   11.00 cm/s LVOT diam:     2.00 cm  LV E/e' lateral: 9.5 LVOT Area:     3.14 cm  RIGHT VENTRICLE RV S prime:     15.40 cm/s TAPSE (M-mode): 2.9 cm LEFT ATRIUM             Index       RIGHT ATRIUM           Index LA diam:        4.70 cm 2.18 cm/m  RA Area:     13.50 cm LA Vol (A2C):   73.3 ml 34.05 ml/m RA Volume:   35.40 ml  16.44 ml/m LA Vol (A4C):   39.3 ml 18.26 ml/m LA  Biplane Vol: 55.6 ml 25.83 ml/m   AORTA Ao Root diam: 2.70 cm MITRAL VALVE MV Area (PHT): 2.69 cm     SHUNTS MV Decel Time: 282 msec     Systemic Diam: 2.00 cm MV E velocity: 105.00 cm/s MV A velocity: 57.80 cm/s MV E/A ratio:  1.82 Rozann Lesches MD Electronically signed by Rozann Lesches MD Signature Date/Time: 03/11/2020/4:59:31 PM    Final      Time Spent in minutes  30     Desiree Hane M.D on 04/07/2020 at 9:37 PM  To page go to [email or web address] - password [facility]

## 2020-04-07 NOTE — Progress Notes (Signed)
Hypoglycemic Event  CBG: 65  Treatment: Administered Previously scheuled tube feed per patient's allowance completed at 0610  Symptoms: N/A   Follow-up CBG: Time: 0625 CBG Result:114  Possible Reasons for Event: Refusal of tube feedings  Comments/MD notified:   [name]

## 2020-04-07 NOTE — Progress Notes (Signed)
Nutrition Follow-up  DOCUMENTATION CODES:   Morbid obesity  INTERVENTION:   -Continue 237 ml Osmolite 1.5 TID via PEG -Flush with 60 ml before and after each bolus (360 ml total)  -Ensure Plus TID PO, each provides 350 kcals and 13g protein  NUTRITION DIAGNOSIS:   Increased nutrient needs related to cancer and cancer related treatments, chronic illness as evidenced by estimated needs.  Ongoing.  GOAL:   Patient will meet greater than or equal to 90% of their needs  Meeting with PO + TF  MONITOR:   TF tolerance, Diet advancement, Labs, Weight trends  ASSESSMENT:   59 year old female with history significant of hiatal hernia, HTN, bronchitis, COPD, and chronic respiratory failure on 5 L oxygen at night, presents with AMS and worsening dyspnea over the last 4 days.  11/11 MBS by SLP with abnormal larynx, CT neck with supraglottic mass 11/13 Direct laryngoscopy with biopsy and trach 11/14 intubated 11/15 CT chest/abdomen pelvis with no signs of metastatic disease, partially necrotic lymph node to left neck, Cortrak placed, transitioned to trach collar 11/17 transferred to [facility] for chemoradiation 11/22: PEG placement 11/27: MBS: SLP recommending D3 diet with thin liquids 12/1: diet liberalized to regular  12/3: Bolus feeds resumed  Patient continues to receive bolus feeds of Osmolite 1.5 TID via PEG. Pt did refuse a feeding last night d/t feeling of fullness. Per RN note, residual was checked which was not significant. Pt has not had a BM since 12/4. Refused suppository for constipation.  Pt is drinking Ensures. About 2 daily.  Admission weight: 244 lbs. Current weight: 261 lbs.  I/Os: -5.1L since 11/24 UOP: 850 ml x 24 hrs  Medications: Colace, Multivitamin with minerals daily  Labs reviewed:  CBGs: 90-119 Low Na  Diet Order:   Diet Order            Diet regular Room service appropriate? Yes; Fluid consistency: Thin  Diet effective now                  EDUCATION NEEDS:   Not appropriate for education at this time  Skin:  Skin Assessment: Skin Integrity Issues: Skin Integrity Issues:: Other (Comment) Other: Cellulits; LLE; MASD; R thigh  Last BM:  11/13  Height:   Ht Readings from Last 1 Encounters:  03/14/20 5\' 5"  (1.651 m)    Weight:   Wt Readings from Last 1 Encounters:  04/06/20 118.7 kg   BMI:  Body mass index is 43.53 kg/m.  Estimated Nutritional Needs:   Kcal:  2100-2300  Protein:  85-100g  Fluid:  2L/day  Clayton Bibles, MS, RD, LDN Inpatient Clinical Dietitian Contact information available via Amion

## 2020-04-08 ENCOUNTER — Inpatient Hospital Stay (HOSPITAL_COMMUNITY): Payer: Medicaid Other

## 2020-04-08 ENCOUNTER — Ambulatory Visit
Admit: 2020-04-08 | Discharge: 2020-04-08 | Disposition: A | Discharge status: Home / Self Care | Payer: Medicaid Other | Attending: Radiation Oncology | Admitting: Radiation Oncology

## 2020-04-08 LAB — COMPREHENSIVE METABOLIC PANEL
ALT: 15 U/L (ref 0–44)
AST: 14 U/L — ABNORMAL LOW (ref 15–41)
Albumin: 2.6 g/dL — ABNORMAL LOW (ref 3.5–5.0)
Alkaline Phosphatase: 51 U/L (ref 38–126)
Anion gap: 10 (ref 5–15)
BUN: 31 mg/dL — ABNORMAL HIGH (ref 6–20)
CO2: 25 mmol/L (ref 22–32)
Calcium: 8.3 mg/dL — ABNORMAL LOW (ref 8.9–10.3)
Chloride: 98 mmol/L (ref 98–111)
Creatinine, Ser: 1.18 mg/dL — ABNORMAL HIGH (ref 0.44–1.00)
GFR, Estimated: 53 mL/min — ABNORMAL LOW (ref >60)
Glucose, Bld: 86 mg/dL (ref 70–99)
Potassium: 4.8 mmol/L (ref 3.5–5.1)
Sodium: 133 mmol/L — ABNORMAL LOW (ref 135–145)
Total Bilirubin: 0.5 mg/dL (ref 0.3–1.2)
Total Protein: 5.5 g/dL — ABNORMAL LOW (ref 6.5–8.1)

## 2020-04-08 LAB — CBC
HCT: 34.7 % — ABNORMAL LOW (ref 36.0–46.0)
Hemoglobin: 11.1 g/dL — ABNORMAL LOW (ref 12.0–15.0)
MCH: 30.9 pg (ref 26.0–34.0)
MCHC: 32 g/dL (ref 30.0–36.0)
MCV: 96.7 fL (ref 80.0–100.0)
Platelets: 151 10*3/uL (ref 150–400)
RBC: 3.59 MIL/uL — ABNORMAL LOW (ref 3.87–5.11)
RDW: 14.2 % (ref 11.5–15.5)
WBC: 6 10*3/uL (ref 4.0–10.5)
nRBC: 0 % (ref 0.0–0.2)

## 2020-04-08 LAB — GLUCOSE, CAPILLARY
Glucose-Capillary: 132 mg/dL — ABNORMAL HIGH (ref 70–99)
Glucose-Capillary: 142 mg/dL — ABNORMAL HIGH (ref 70–99)
Glucose-Capillary: 81 mg/dL (ref 70–99)
Glucose-Capillary: 85 mg/dL (ref 70–99)
Glucose-Capillary: 92 mg/dL (ref 70–99)
Glucose-Capillary: 96 mg/dL (ref 70–99)
Glucose-Capillary: 99 mg/dL (ref 70–99)

## 2020-04-08 MED ORDER — BISACODYL 10 MG RE SUPP: 10.0000 mg | Freq: Once | RECTAL | Status: DC

## 2020-04-08 NOTE — Progress Notes (Signed)
[facility]  PROGRESS NOTE  Veronica Horton RXV:[id number] DOB: [birth year] DOA: 03/10/2020 PCP: [facility] date - 03/10/2020   Admitting Physician Albertine Patricia, MD  Outpatient Primary MD for the patient is [facility] is a 59 y.o. year old female with medical history significant for COPD, chronic hypoxic respiratory failure on 5 L O2, hiatal hernia, morbid obesity who presented on 03/10/2020 with shortness of breath thus far have COPD exacerbation.  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  Has been at [facility] since 11/17 radiation therapy, also had PEG tube placed on 11/22.  Currently receiving chemoradiation.    Subjective  She still feels like her stool is hard.  Had a big bowel movement this morning per nursing.  Still reports that hides him pain in the lower part of her abdomen.  Worse with coughing  A & P  Abdominal pain, and lower quadrant, suspect constipation given reports of diminished bowel movements of the past 3 days with minimal improvement using enema PEG tube site looks stable.  Abdominal x-ray shows nonobstructive gas pattern.  Had a BM yesterday and this morning but still complaining of intermittent lower quadrant pain, denies any urinary symptoms, LFTs unremarkable, doubt infection given normal white count and no fevers -We will add suppository -Optimize bowel regimen continue Colace twice daily, add MiraLAX twice daily -Continue  PPI -If persist despite bowel optimization will assess with CT abdomen  Supraglottic cell laryngeal cancer, status post tracheostomy and PEG placement.  Patient declined total laryngectomy -Responding well to chemochterapy, discussed with Dr.Iruku -Receiving radiation  Acute on chronic hypoxic/hypercarbic  respiratory failure.  Likely multifactorial etiology including COPD further exacerbated by laryngeal squamous cell carcinoma as well aspiration pneumonia.  No current evidence of wheezing, feels COPD stable, discussed with oncology status of her larger masses decreased significantly, would expect ability to decrease O2 requirements -On size 6 uncuffed Shiley placed by PCCM on 12/2 -Still requiring 10 L via trach, previous home regimen 5 L, will need to wean to be able to be safe from a disposition standpoint to go home, if unable to wean will assess with chest x-ray to ensure no contributing factors from a lung standpoint, goal SPO2 greater than 88% -Encourage incentive spirometry  AKI, improving.  Baseline creatinine point 9-1.  1.36 on 12/8, downtrending to 1.1 on 12/9.  Suspect likely prerenal given diminished appetite related to ongoing/intermittent abdominal pain. -Avoid nephrotoxins -Monitor output -Monitor BMP  COPD, no wheezing -Continue inhaler regimen  Hypoglycemia.  Closely monitor, patient declining further tube feeds given abdominal pain. -Continue close monitor CBG on insulin regimen  GERD -continue PPI     Family Communication  : None at bedside  Code Status : Full  Disposition Plan  :  Patient is from home. Anticipated d/c date: 2 to 3 days. Barriers to d/c or necessity for inpatient status:  Still requiring fairly high amounts of oxygen currently at 10 L trach support, case management assisting with disposition patient will likely need home health therapy at minimum Consults  : Oncology  Procedures  :    DVT Prophylaxis  :  Lovenox  MDM: The below labs and imaging reports were reviewed and summarized above.  Medication management as above.  Lab Results  Component Value Date   PLT 151 04/08/2020  Diet :  Diet Order            Diet regular Room service appropriate? Yes; Fluid consistency: Thin  Diet effective now                  Inpatient  Medications Scheduled Meds: . arformoterol  15 mcg Nebulization BID  . bisacodyl  10 mg Rectal Once  . budesonide (PULMICORT) nebulizer solution  0.5 mg Nebulization BID  . chlorhexidine  15 mL Mouth Rinse BID  . docusate  100 mg Per Tube BID  . enoxaparin (LOVENOX) injection  60 mg Subcutaneous Daily  . feeding supplement  237 mL Oral TID BM  . feeding supplement (OSMOLITE 1.5 CAL)  237 mL Per Tube TID BM  . gabapentin  600 mg Oral QHS  . insulin aspart  0-9 Units Subcutaneous Q4H  . ipratropium-albuterol  3 mL Nebulization BID  . ketotifen  1 drop Both Eyes BID  . lidocaine  1 patch Transdermal Q24H  . mouth rinse  15 mL Mouth Rinse q12n4p  . melatonin  5 mg Oral QHS  . multivitamin with minerals  1 tablet Per Tube Daily  . nicotine  21 mg Transdermal Daily  . pantoprazole sodium  40 mg Per Tube Daily  . polyethylene glycol  17 g Per Tube BID  . scopolamine  1 patch Transdermal Q72H   Continuous Infusions: . sodium chloride     PRN Meds:.sodium chloride, acetaminophen (TYLENOL) oral liquid 160 mg/5 mL, bisacodyl, diclofenac Sodium, diphenhydrAMINE, glucagon, guaiFENesin-dextromethorphan, hydrOXYzine, ipratropium-albuterol, lidocaine (PF), magic mouthwash w/lidocaine, oxyCODONE, prochlorperazine **OR** prochlorperazine  Antibiotics  :   Anti-infectives (From admission, onward)   Start     Dose/Rate Route Frequency Ordered Stop   03/22/20 1700  vancomycin (VANCOCIN) IVPB 1000 mg/200 mL premix  Status:  Discontinued        1,000 mg 200 mL/hr over 60 Minutes Intravenous  Once 03/22/20 1605 03/23/20 0236   03/22/20 1604  vancomycin (VANCOCIN) 1-5 GM/200ML-% IVPB       Note to Pharmacy: [name]   : cabinet override      03/22/20 1604 03/22/20 1615   03/14/20 1100  levofloxacin (LEVAQUIN) IVPB 750 mg        750 mg 100 mL/hr over 90 Minutes Intravenous Daily 03/14/20 0955 03/19/20 1146   03/10/20 1730  doxycycline (VIBRAMYCIN) 100 mg in sodium chloride 0.9 % 250 mL IVPB   Status:  Discontinued        100 mg 125 mL/hr over 120 Minutes Intravenous Every 12 hours 03/10/20 1634 03/14/20 0955   03/10/20 1600  azithromycin (ZITHROMAX) 500 mg in sodium chloride 0.9 % 250 mL IVPB  Status:  Discontinued        500 mg 250 mL/hr over 60 Minutes Intravenous Every 24 hours 03/10/20 1529 03/10/20 1538   03/10/20 1500  clindamycin (CLEOCIN) IVPB 600 mg  Status:  Discontinued        600 mg 100 mL/hr over 30 Minutes Intravenous  Once 03/10/20 1458 03/10/20 1521       Objective   Vitals:   04/08/20 0923 04/08/20 1120 04/08/20 1152 04/08/20 1519  BP: (!) 112/56  112/60   Pulse: 70 65 65 63  Resp: 20 16 19    Temp:   98.9 F (37.2 C)   TempSrc:      SpO2: 90% 90% 93% 93%  Weight:      Height:        SpO2: 93 % O2 Flow  Rate (L/min): 10 L/min FiO2 (%): 80 %  Wt Readings from Last 3 Encounters:  04/06/20 118.7 kg  01/24/20 111.8 kg  01/30/19 (!) 136.3 kg     Intake/Output Summary (Last 24 hours) at 04/08/2020 1534 Last data filed at 04/07/2020 2130 Gross per 24 hour  Intake 120 ml  Output 800 ml  Net -680 ml    Physical Exam:     Awake Alert, Oriented X 3, flat affect No new F.N deficits,  [name].AT, Normal respiratory effort on trach collar, no wheezing RRR,No Gallops,Rubs or new Murmurs,  Abd Soft, tender in lower quadrant with deep palpation,no tenderness around PEG tube No Cyanosis, No new Rash or bruise    I have personally reviewed the following:   Data Reviewed:  CBC Recent Labs  Lab 04/02/20 1446 04/05/20 0517 04/07/20 0800 04/08/20 0517  WBC 5.5 5.8 7.4 6.0  HGB 12.0 12.0 10.9* 11.1*  HCT 37.6 37.5 33.9* 34.7*  PLT 149* 171 177 151  MCV 99.5 98.7 98.3 96.7  MCH 31.7 31.6 31.6 30.9  MCHC 31.9 32.0 32.2 32.0  RDW 14.6 14.3 14.3 14.2  LYMPHSABS 0.7 1.0 0.9  --   MONOABS 0.5 0.6 0.8  --   EOSABS 0.1 0.1 0.0  --   BASOSABS 0.0 0.0 0.0  --     Chemistries  Recent Labs  Lab 04/02/20 1446 04/05/20 0517 04/07/20 0800  04/08/20 0517  NA 135 137 134* 133*  K 4.1 4.4 4.4 4.8  CL 99 99 99 98  CO2 28 29 27 25   GLUCOSE 101* 89 126* 86  BUN 20 14 34* 31*  CREATININE 0.99 1.00 1.36* 1.18*  CALCIUM 8.5* 8.5* 8.3* 8.3*  MG  --  2.1 2.3  --   AST 15  --   --  14*  ALT 18  --   --  15  ALKPHOS 61  --   --  51  BILITOT 0.6  --   --  0.5   ------------------------------------------------------------------------------------------------------------------ No results for input(s): CHOL, HDL, LDLCALC, TRIG, CHOLHDL, LDLDIRECT in the last 72 hours.  Lab Results  Component Value Date   HGBA1C 5.4 03/15/2020   ------------------------------------------------------------------------------------------------------------------ No results for input(s): TSH, T4TOTAL, T3FREE, THYROIDAB in the last 72 hours.  Invalid input(s): FREET3 ------------------------------------------------------------------------------------------------------------------ No results for input(s): VITAMINB12, FOLATE, FERRITIN, TIBC, IRON, RETICCTPCT in the last 72 hours.  Coagulation profile No results for input(s): INR, PROTIME in the last 168 hours.  No results for input(s): DDIMER in the last 72 hours.  Cardiac Enzymes No results for input(s): CKMB, TROPONINI, MYOGLOBIN in the last 168 hours.  Invalid input(s): CK ------------------------------------------------------------------------------------------------------------------    Component Value Date/Time   BNP 64.6 03/29/2020 0515    Micro Results Recent Results (from the past 240 hour(s))  Culture, respiratory (non-expectorated)     Status: None   Collection Time: 04/02/20  3:36 PM   Specimen: Tracheal Aspirate; Respiratory  Result Value Ref Range Status   Specimen Description   Final    TRACHEAL ASPIRATE Performed at [facility] [address], [place], [place] [address]    Special Requests   Final    NONE Performed at [facility],  [place] [address] [place], [place] [address]    Gram Stain   Final    RARE WBC PRESENT, PREDOMINANTLY PMN RARE GRAM POSITIVE COCCI RARE GRAM NEGATIVE RODS Performed at [facility], [place] [address], [place], [place] [address]    Culture MODERATE SERRATIA  MARCESCENS  Final   Report Status 04/05/2020 FINAL  Final   Organism ID, Bacteria SERRATIA MARCESCENS  Final      Susceptibility   Serratia marcescens - MIC*    CEFAZOLIN >=64 RESISTANT Resistant     CEFEPIME <=0.12 SENSITIVE Sensitive     CEFTAZIDIME <=1 SENSITIVE Sensitive     CEFTRIAXONE <=0.25 SENSITIVE Sensitive     CIPROFLOXACIN <=0.25 SENSITIVE Sensitive     GENTAMICIN <=1 SENSITIVE Sensitive     TRIMETH/SULFA <=20 SENSITIVE Sensitive     * MODERATE SERRATIA MARCESCENS    Radiology Reports CT SOFT TISSUE NECK W CONTRAST  Addendum Date: 03/11/2020   ADDENDUM REPORT: 03/11/2020 16:36 ADDENDUM: Study discussed by telephone with Dr. Jolaine Artist MEMON on 03/11/2020 at 1631 hours. Electronically Signed   By: Genevie Ann M.D.   On: 03/11/2020 16:36   Result Date: 03/11/2020 CLINICAL DATA:  59 year old female with dysphagia. Tonsillitis suspected. EXAM: CT NECK WITH CONTRAST TECHNIQUE: Multidetector CT imaging of the neck was performed using the standard protocol following the bolus administration of intravenous contrast. CONTRAST:  58mL OMNIPAQUE IOHEXOL 300 MG/ML  SOLN COMPARISON:  Chest CT 01/28/2019. FINDINGS: Pharynx and larynx: Bulky soft tissue mass throughout the bilateral supraglottic larynx and affecting the hypopharynx. Lobulated diffuse thickening of the epiglottis (series 2, image 53). Diffuse false cord and anterior commissure involvement with evidence of extension into the right paraglottic space on series 2 image 61 - where nodular direct extension of tumor and/or inseparable abnormal right level 3 lymph node protrudes on coronal image 58. Asymmetric erosion of the undersurface of the right thyroid cartilage on image 65.  Asymmetric sclerosis of the right arytenoid on image 67., and midline extension of tumor is suspected through the anterior commissure a distance of about 11 mm as seen on series 2, image 67 and sagittal image 48. All told, tumor size is estimated at 37 x 52 by 45 mm (AP by transverse by CC). There is evidence of airway compromise. The true cords may be spared as seen on series 2, image 71. Subglottic larynx is within normal limits. Above the vallecula pharyngeal contours appear normal. Normal superior parapharyngeal and retropharyngeal spaces. Salivary glands: Negative sublingual space. Submandibular glands and parotid glands remain within normal limits. Thyroid: Negative. Lymph nodes: Malignant left level IIIb lymph node measures 17 mm short axis and 29 mm long axis (series 2, image 55 and coronal image 73). As stated above it is possible of malignant right level IIIa lymph node is inseparable from the parent tumor along the right paraglottic space (coronal image 58). Smaller but asymmetrically enlarged left level 4 nodes measure up to 9 mm short axis (coronal image 72). No other abnormal or suspicious lymph nodes identified. Vascular: Major vascular structures in the neck and at the skull base remain patent including both internal jugular veins. The left vertebral artery appears dominant. Calcified atherosclerosis at the skull base. Limited intracranial: Negative. Visualized orbits: Negative. Mastoids and visualized paranasal sinuses: Clear. Skeleton: Absent and carious dentition. Cervical spine degeneration. No suspicious osseous lesion identified. 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 No superior mediastinal lymphadenopathy. Visible axillary lymph nodes are normal. Mild dependent atelectasis.  No upper lung nodule identified. IMPRESSION: 1. 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 Recommend ENT consultation. Involvement of right laryngeal cartilages,  with extension in the midline anterior to the strap muscles, and also extension through the right paraglottic space and/or inseparable malignant right level IIIa lymph node. Estimated tumor long axis  5.2 cm. 2. Malignant contralateral left level 3b lymph node is 2.9 cm long axis. Smaller indeterminate left level 4 lymph nodes. 3. No distant metastatic disease identified in the neck or upper chest. Electronically Signed: By: Genevie Ann M.D. On: 03/11/2020 16:23   IR GASTROSTOMY TUBE MOD SED  Result Date: 03/22/2020 INDICATION: Head and neck malignancy, dysphagia EXAM: FLUOROSCOPIC 16 FRENCH BALLOON RETENTION GASTROSTOMY MEDICATIONS: 1 G VANCOMYCIN; Antibiotics were administered within 1 hour of the procedure. GLUCAGON 0.5 MG IV ANESTHESIA/SEDATION: Versed 0 mg IV; Fentanyl 50 mcg IV Moderate Sedation Time:  12 MINUTES The patient was continuously monitored during the procedure by the interventional radiology nurse under my direct supervision. CONTRAST:  10 CC-administered into the gastric lumen. FLUOROSCOPY TIME:  Fluoroscopy Time: 1 minutes 36 seconds (53 mGy). COMPLICATIONS: None immediate. PROCEDURE: Informed written consent was obtained from the patient after a thorough discussion of the procedural risks, benefits and alternatives. All questions were addressed. Maximal Sterile Barrier Technique was utilized including caps, mask, sterile gowns, sterile gloves, sterile drape, hand hygiene and skin antiseptic. A timeout was performed prior to the initiation of the procedure. Previous imaging reviewed. Patient has an existing feeding tube within the stomach. This was utilized to insufflate the stomach with air. The stomach was localized beneath the left subcostal margin in the left abdomen with biplane fluoroscopy. Overlying skin marked. Under sterile conditions and local anesthesia, introducer needle was advanced into the stomach. Contrast injection confirms percutaneous needle access within the stomach. Single T  tack deployed for gastropexy. Amplatz guidewire inserted. Tract dilatation performed to insert the 16 French balloon retention gastrostomy. Balloon tip inflated with 7 cc mixture of saline and 1 cc contrast. This was retracted against the anterior gastric wall. Contrast injection confirms position in the stomach. Images obtained for documentation. T tacks secured to the skin site. A sterile dressing applied. No immediate complication. Patient tolerated the procedure well. IMPRESSION: Successful fluoroscopic 47 French balloon retention gastrostomy Electronically Signed   By: Jerilynn Mages.  Shick M.D.   On: 03/22/2020 16:53   CT CHEST ABDOMEN PELVIS W CONTRAST  Result Date: 03/15/2020 CLINICAL DATA:  New diagnosis of head neck mass EXAM: CT CHEST, ABDOMEN, AND PELVIS WITH CONTRAST TECHNIQUE: Multidetector CT imaging of the chest, abdomen and pelvis was performed following the standard protocol during bolus administration of intravenous contrast. CONTRAST:  164mL OMNIPAQUE IOHEXOL 300 MG/ML  SOLN COMPARISON:  Prior swallowing evaluation and CT of the neck. FINDINGS: CT CHEST FINDINGS Cardiovascular: Calcified coronary artery disease. Aorta is normal caliber. 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 Central pulmonary vasculature is mildly engorged. Approximately 3 cm greatest caliber unchanged from previous exam. Unremarkable on limited venous phase assessment. Mediastinum/Nodes: Supraglottic mass partially imaged on the first image of the data set. Interval placement of tracheostomy tube with resultant pneumomediastinum in the anterior mediastinum in upper mediastinum. No adenopathy within the mediastinum or axilla. No hilar adenopathy. Rounded partial necrotic lymph node in the LEFT neck partially visualized, corresponding to level IIIb lymph node seen on the neck CT. Lungs/Pleura: Basilar consolidative changes bilaterally with similar pattern of consolidation though decreased volume  loss when compared to the study of September of 2020. Airways are patent. Musculoskeletal: See below for full musculoskeletal detail. CT ABDOMEN PELVIS FINDINGS Hepatobiliary: Liver without focal lesion. Post cholecystectomy. No biliary duct dilation. Pancreas: Mild atrophy of the pancreas without focal lesion, ductal dilation or inflammation. Spleen: Spleen normal in size and contour. Adrenals/Urinary Tract: Adrenal glands are normal.  Symmetric renal enhancement. No hydronephrosis. LEFT renal cysts, largest arising from the lower pole measuring 2.8 x 2.9 cm. Urinary bladder under distended limiting assessment. Stomach/Bowel: Gastrointestinal tract with signs of colonic diverticulosis. No acute bowel process. Rectus diastasis. Postoperative changes in the midline of the abdomen related to prior ventral hernia repair. Mild bulging at the site of hernia repair, no herniation beyond the inserted mesh near the umbilicus. Vascular/Lymphatic: Calcified atheromatous plaque in the abdominal aorta. No aneurysmal dilation. There is no gastrohepatic or hepatoduodenal ligament lymphadenopathy. No retroperitoneal or mesenteric lymphadenopathy. No pelvic sidewall lymphadenopathy. Reproductive: No adnexal mass. Reproductive structures are unremarkable. Other: Post abdominal wall reconstruction with rectus diastasis, no frank hernia. Musculoskeletal: Spinal degenerative changes. Degenerative changes in glenohumeral joints and hips. Ununited fractures along posterior LEFT chest involving ribs 10 through 12. These appear subacute or chronic. These did not appear to be present on previous imaging from September of 2020. IMPRESSION: 1. Supraglottic mass partially imaged on the first image of the data set. No signs of metastatic disease to the chest, abdomen or pelvis. 2. Rounded partially necrotic lymph node in the LEFT neck partially visualized, corresponding to level IIIb lymph node seen on the neck CT. 3. Basilar consolidative  changes bilaterally with similar pattern of consolidation though decreased volume loss when compared to the study of September of 2020. Findings may be related to aspiration. No definite lesions seen in the chest though these areas could obscure underlying lesions. 4. Postoperative changes with small amount of pneumomediastinum presumably related to recent tracheostomy tube insertion. 5. Ununited fractures along posterior LEFT chest involving ribs 10 through 12. These appear subacute or chronic. These did not appear to be present on previous imaging from September of 2020. 6. Post abdominal wall reconstruction with rectus diastasis, no frank hernia. 7. Aortic atherosclerosis. Aortic Atherosclerosis (ICD10-I70.0). Electronically Signed   By: Zetta Bills M.D.   On: 03/15/2020 10:53   DG CHEST PORT 1 VIEW  Result Date: 04/03/2020 CLINICAL DATA:  Increasing shortness of breath EXAM: PORTABLE CHEST 1 VIEW COMPARISON:  03/31/2020 FINDINGS: Tracheostomy tube is noted in satisfactory position. Cardiac shadow is enlarged but stable. Some persistent bibasilar opacities are noted although mildly improved when compared with the prior exam. No new focal abnormality is noted. IMPRESSION: Improved aeration as described. Electronically Signed   By: Inez Catalina M.D.   On: 04/03/2020 11:59   DG CHEST PORT 1 VIEW  Result Date: 03/31/2020 CLINICAL DATA:  Pleural effusion EXAM: PORTABLE CHEST 1 VIEW COMPARISON:  03/29/2020 FINDINGS: Tracheostomy unchanged. Lung volumes are small, however, pulmonary insufflation remains stable since prior examination. Bilateral pleural effusions are present small and decreased in size since prior examination. Ovoid opacity within the right mid lung zone likely represents fluid within the fissure. There is bibasilar atelectasis. Cardiac size is within normal limits. No acute bone abnormality. IMPRESSION: Slight interval decrease in small bilateral pleural effusions with associated bibasilar  atelectasis. Pulmonary hypoinflation. Electronically Signed   By: Fidela Salisbury MD   On: 03/31/2020 05:41   DG CHEST PORT 1 VIEW  Result Date: 03/29/2020 CLINICAL DATA:  Dyspnea EXAM: PORTABLE CHEST 1 VIEW COMPARISON:  03/25/2020 FINDINGS: Tracheostomy unchanged. Pulmonary insufflation is symmetric though has decreased slightly since prior examination. Small bilateral pleural effusions have developed with associated bibasilar atelectasis. No pneumothorax. Cardiac size within normal limits. Pulmonary vascularity is normal. No acute bone abnormality. IMPRESSION: Interval development of small bilateral pleural effusions with bibasilar compressive atelectasis. Electronically Signed   By: Fidela Salisbury MD  On: 03/29/2020 06:00   DG CHEST PORT 1 VIEW  Result Date: 03/25/2020 CLINICAL DATA:  Hypoxia, shortness of breath, altered mental status history COPD, hypertension, history laryngeal carcinoma, tracheostomy, undergoing radiation therapy EXAM: PORTABLE CHEST 1 VIEW COMPARISON:  Portable exam 0844 hours compared to 03/23/2020 FINDINGS: Tracheostomy tube projects over tracheal air column. Normal heart size, mediastinal contours, and pulmonary vascularity. Improving opacity at LEFT lung base which could represent atelectasis or consolidation. Subsegmental atelectasis RIGHT middle lobe slightly improved. Tiny LEFT pleural effusion. Remaining lungs clear. No pneumothorax. Bones demineralized. IMPRESSION: Persistent subsegmental atelectasis at RIGHT base with improving atelectasis versus consolidation and tiny pleural effusion at LEFT base. Electronically Signed   By: Lavonia Dana M.D.   On: 03/25/2020 10:33   DG CHEST PORT 1 VIEW  Result Date: 03/23/2020 CLINICAL DATA:  Hypoxia. EXAM: PORTABLE CHEST 1 VIEW COMPARISON:  Left 19 2021. FINDINGS: Interim removal of feeding tube. Tracheostomy tube in stable position. Cardiomegaly. Persistent bibasilar atelectasis/infiltrates and small bilateral pleural effusions.  Density noted over the right mid chest may represent atelectasis and or pleural fluid pseudotumor, no interim change. IMPRESSION: 1. Interim removal of feeding tube. Tracheostomy tube in stable position. 2. Stable cardiomegaly. 3. Persistent bibasilar atelectasis/infiltrates and small bilateral pleural effusions. Density of the right mid chest may represent atelectasis and or pleural fluid pseudotumor, no interim change. Electronically Signed   By: Marcello Moores  Register   On: 03/23/2020 06:39   DG CHEST PORT 1 VIEW  Result Date: 03/19/2020 CLINICAL DATA:  Pneumonia. EXAM: PORTABLE CHEST 1 VIEW COMPARISON:  03/18/2020. FINDINGS: Tracheostomy tube, feeding tube in stable position. Stable cardiomegaly. Improved pulmonary venous congestion. Lung volumes. Persistent left base atelectasis/infiltrate. Persistent right base subsegmental atelectasis. Elliptical density noted over the right mid chest may represent atelectasis and or fissural fluid. Pleural effusions cannot be excluded. No pneumothorax. IMPRESSION: 1. Lines and tubes in stable position. 2. Stable cardiomegaly. Improved pulmonary venous congestion. 3. Persistent left base atelectasis/infiltrate. Persistent right base subsegmental atelectasis. Elliptical density noted over the right mid chest may represent atelectasis and or fissural fluid. Small pleural effusions cannot be excluded. Electronically Signed   By: Marcello Moores  Register   On: 03/19/2020 06:03   DG Chest Port 1 View  Result Date: 03/18/2020 CLINICAL DATA:  Respiratory failure. EXAM: PORTABLE CHEST 1 VIEW COMPARISON:  03/17/2020. FINDINGS: Tracheostomy tube and feeding tube in stable position. Cardiomegaly. Mild pulmonary venous congestion. Mild bilateral interstitial prominence. Mild component of CHF cannot be excluded. Persistent left lower lobe atelectasis/infiltrate. New onset right mid lung prominent atelectatic changes. No pneumothorax. IMPRESSION: 1. Tracheostomy tube and feeding tube in stable  position. 2. Cardiomegaly with mild pulmonary venous congestion and mild bilateral interstitial prominence. Mild component of CHF cannot be excluded. 3. Persistent left lower lobe atelectasis/infiltrate. New onset of right mid lung prominent atelectatic changes. Electronically Signed   By: Marcello Moores  Register   On: 03/18/2020 07:34   DG CHEST PORT 1 VIEW  Result Date: 03/17/2020 CLINICAL DATA:  Respiratory failure EXAM: PORTABLE CHEST 1 VIEW COMPARISON:  March 14, 2020 FINDINGS: Tracheostomy catheter tip is 6.2 cm above the carina. Feeding tube tip is below the diaphragm. No pneumothorax. There is a small left pleural effusion with consolidation in the left lower lobe. There is right base atelectasis with equivocal small right pleural effusion. Heart is mildly enlarged, stable, with pulmonary vascularity normal. No adenopathy. No bone lesions. IMPRESSION: Tube positions as described without pneumothorax. Left pleural effusion with equivocal right pleural effusion. Airspace opacity consistent with combination  of atelectasis and pneumonia left lower lobe. Mild atelectasis right base. Stable cardiac prominence. Electronically Signed   By: Lowella Grip III M.D.   On: 03/17/2020 09:02   DG CHEST PORT 1 VIEW  Result Date: 03/14/2020 CLINICAL DATA:  Respiratory failure with hypercapnia EXAM: PORTABLE CHEST 1 VIEW COMPARISON:  March 10, 2020 FINDINGS: Tracheostomy catheter tip is 6.2 cm above the carina. There is ill-defined airspace opacity in the left upper lobe and left base regions with equivocal left pleural effusion. The right lung is clear. Heart is upper normal in size with pulmonary vascularity normal. No adenopathy. No bone lesions. IMPRESSION: Tracheostomy as described without pneumothorax. Patchy airspace opacity consistent with scattered areas of pneumonia in the left upper lobe and left base. Equivocal left pleural effusion. Right lung clear. Stable cardiac silhouette. Electronically Signed    By: Lowella Grip III M.D.   On: 03/14/2020 09:17   DG Chest Portable 1 View  Result Date: 03/10/2020 CLINICAL DATA:  Shortness of breath and cough EXAM: PORTABLE CHEST 1 VIEW COMPARISON:  January 23, 2020 FINDINGS: There is bibasilar atelectatic change. Elsewhere the interstitium is mildly thickened. No consolidation. Heart size and pulmonary vascularity are normal. No adenopathy. There is degenerative change in each shoulder. IMPRESSION: Bibasilar atelectasis. Interstitial thickening likely represents a degree of underlying chronic bronchitis. No edema or airspace opacity. Heart size within normal limits. Electronically Signed   By: Lowella Grip III M.D.   On: 03/10/2020 12:42   DG Abd Portable 1V  Result Date: 04/07/2020 CLINICAL DATA:  Abdomen pain EXAM: PORTABLE ABDOMEN - 1 VIEW COMPARISON:  CT 03/15/2020 FINDINGS: Gastrostomy tube in the left mid abdomen. Nonobstructed bowel-gas pattern. Evidence of prior hernia repair. Curvilinear densities in the right lower quadrant, possible calcifications or retained contrast within appendix. IMPRESSION: Nonobstructed gas pattern. Electronically Signed   By: Donavan Foil M.D.   On: 04/07/2020 21:16   DG Swallowing Func-Speech Pathology  Result Date: 03/27/2020 Objective Swallowing Evaluation: Type of Study: MBS-Modified Barium Swallow Study  Patient Details Name: Veronica Horton MRN: [id number] Date of Birth: 22-Oct-1960 Today's Date: 03/27/2020 Time: SLP Start Time (ACUTE ONLY): 3267 -SLP Stop Time (ACUTE ONLY): 1235 SLP Time Calculation (min) (ACUTE ONLY): 20 min Past Medical History: Past Medical History: Diagnosis Date . Bronchitis  . Class 3 obesity 01/23/2020 . COPD (chronic obstructive pulmonary disease) ([place])  . Degenerative disc disease, lumbar  . Hiatal hernia  . Hypertension  Past Surgical History: Past Surgical History: Procedure Laterality Date . CHOLECYSTECTOMY   . degenerative bone disease   . DIRECT LARYNGOSCOPY N/A 03/13/2020   Procedure: DIRECT LARYNGOSCOPY WITH BIOPSY;  Surgeon: Jason Coop, DO;  Location: [facility];  Service: ENT;  Laterality: N/A; . INCISIONAL HERNIA REPAIR N/A 01/15/2019  Procedure: [name] HERNIORRHAPHY WITH MESH;  Surgeon: [name], MD;  Location: [facility];  Service: General;  Laterality: N/A; . IR GASTROSTOMY TUBE MOD SED  03/22/2020 . OMENTECTOMY N/A 01/15/2019  Procedure: OMENTECTOMY;  Surgeon: [name], MD;  Location: [facility];  Service: General;  Laterality: N/A; . TRACHEOSTOMY TUBE PLACEMENT N/A 03/13/2020  Procedure: AWAKE TRACHEOSTOMY;  Surgeon: Jason Coop, DO;  Location: [facility];  Service: ENT;  Laterality: N/A; HPI: Varshini Arrants is a 59 y/o F with history of COPD,chronic respiratory failure on home oxygen at 5 L/min, with large laryngeal mass lesion, now s/p tracheostomy with Shiley 6-0 and direct laryngoscopy and biopsy. 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  Pt is also s/p PEG for nutrition.  Plans are for chemo and radiation concurrently starting while in hospital.  Prior medical history includes smoking and some dyspnea if walking long distanced.  Subjective: pt awake in chair Assessment / Plan / Recommendation CHL IP CLINICAL IMPRESSIONS 03/27/2020 Clinical Impression Pt continues with with mild pharyngeal phase dysphagia - obstructive due to mass impacting epiglottic deflection.  In addition, she requires extra time to masticate solids due to dentition.  Swallow trigger is timely with minimal laryngeal penetration of liquid into larynx during the swallow due to decreased epiglottic deflection.  Min vallecular residue with solids without pt awareness - liquid wash assisted to clear.  Pt with prominent cricopharyngeus and appears with trace backflow near this region but view was suboptimal.  Recommend D3/mech soft and thin liquids, po medications whole in puree and follow with liquid wash.  Of note, following testing, pt did cough and slight whitish coating  noted on secretions but no aspiration noted during testing.  Suspect pt has some low grade chronic secretion aspiration. SLP Visit Diagnosis Dysphagia, oropharyngeal phase (R13.12) Attention and concentration deficit following -- Frontal lobe and executive function deficit following -- Impact on safety and function Mild aspiration risk   CHL IP TREATMENT RECOMMENDATION 03/27/2020 Treatment Recommendations Therapy as outlined in treatment plan below   Prognosis 03/27/2020 Prognosis for Safe Diet Advancement Fair Barriers to Reach Goals Other (Comment) Barriers/Prognosis Comment -- CHL IP DIET RECOMMENDATION 03/27/2020 SLP Diet Recommendations Dysphagia 3 (Mech soft) solids;Thin liquid Liquid Administration via -- Medication Administration Whole meds with puree Compensations Slow rate;Small sips/bites Postural Changes --   CHL IP OTHER RECOMMENDATIONS 03/27/2020 Recommended Consults (No Data) Oral Care Recommendations Oral care BID Other Recommendations Clarify dietary restrictions   CHL IP FOLLOW UP RECOMMENDATIONS 03/27/2020 Follow up Recommendations Skilled Nursing facility   [facility] IP FREQUENCY AND DURATION 03/27/2020 Speech Therapy Frequency (ACUTE ONLY) min 1 x/week Treatment Duration 1 week      CHL IP ORAL PHASE 03/27/2020 Oral Phase WFL Oral - Pudding Teaspoon -- Oral - Pudding Cup -- Oral - Honey Teaspoon -- Oral - Honey Cup -- Oral - Nectar Teaspoon -- Oral - Nectar Cup -- Oral - Nectar Straw -- Oral - Thin Teaspoon -- Oral - Thin Cup -- Oral - Thin Straw -- Oral - Puree -- Oral - Mech Soft -- Oral - Regular -- Oral - Multi-Consistency -- Oral - Pill -- Oral Phase - Comment --  CHL IP PHARYNGEAL PHASE 03/27/2020 Pharyngeal Phase -- Pharyngeal- Pudding Teaspoon -- Pharyngeal -- Pharyngeal- Pudding Cup -- Pharyngeal -- Pharyngeal- Honey Teaspoon -- Pharyngeal -- Pharyngeal- Honey Cup -- Pharyngeal -- Pharyngeal- Nectar Teaspoon -- Pharyngeal -- Pharyngeal- Nectar Cup -- Pharyngeal -- Pharyngeal- Nectar Straw  Reduced epiglottic inversion;Pharyngeal residue - valleculae Pharyngeal -- Pharyngeal- Thin Teaspoon Reduced epiglottic inversion;Pharyngeal residue - valleculae Pharyngeal -- Pharyngeal- Thin Cup Pharyngeal residue - valleculae;Reduced epiglottic inversion;Penetration/Aspiration during swallow Pharyngeal Material enters airway, remains ABOVE vocal cords then ejected out Pharyngeal- Thin Straw Reduced epiglottic inversion;Pharyngeal residue - valleculae;Penetration/Aspiration during swallow Pharyngeal Material enters airway, remains ABOVE vocal cords then ejected out Pharyngeal- Puree WFL Pharyngeal -- Pharyngeal- Mechanical Soft -- Pharyngeal -- Pharyngeal- Regular Reduced epiglottic inversion Pharyngeal -- Pharyngeal- Multi-consistency -- Pharyngeal -- Pharyngeal- Pill NT Pharyngeal -- Pharyngeal Comment --  CHL IP CERVICAL ESOPHAGEAL PHASE 03/27/2020 Cervical Esophageal Phase -- Pudding Teaspoon -- Pudding Cup -- Honey Teaspoon -- Honey Cup -- Nectar Teaspoon -- Nectar Cup -- Nectar Straw -- Thin Teaspoon -- Thin Cup Prominent cricopharyngeal segment Thin Straw -- Puree --  Mechanical Soft -- Regular -- Multi-consistency -- Pill -- Cervical Esophageal Comment appearance of potential minimal backflow of liquid near UES region = suboptimal view - did not backflow into pharynx/larynx [name], MS [facility] SLP Acute Rehab Services Office [phone] Pager [phone] [name] 03/27/2020, 2:31 PM              DG Swallowing Func-Speech Pathology  Result Date: 03/11/2020 Objective Swallowing Evaluation: Type of Study: MBS-Modified Barium Swallow Study  Patient Details Name: Veronica Horton MRN: [id number] Date of Birth: 22-Sep-1960 Today's Date: 03/11/2020 Time: SLP Start Time (ACUTE ONLY): 3329 -SLP Stop Time (ACUTE ONLY): 1316 SLP Time Calculation (min) (ACUTE ONLY): 21 min Past Medical History: Past Medical History: Diagnosis Date . Bronchitis  . Class 3 obesity 01/23/2020 . COPD (chronic obstructive pulmonary  disease) ([place])  . Degenerative disc disease, lumbar  . Hiatal hernia  . Hypertension  Past Surgical History: Past Surgical History: Procedure Laterality Date . CHOLECYSTECTOMY   . degenerative bone disease   . INCISIONAL HERNIA REPAIR N/A 01/15/2019  Procedure: [name] HERNIORRHAPHY WITH MESH;  Surgeon: [name], MD;  Location: [facility];  Service: General;  Laterality: N/A; . OMENTECTOMY N/A 01/15/2019  Procedure: OMENTECTOMY;  Surgeon: [name], MD;  Location: [facility];  Service: General;  Laterality: N/A; HPI: Veronica Horton  is a 58 y.o. female, with medical history significant of class III obesity, hiatal hernia, hypertension, bronchitis, COPD, chronic respiratory failure on home oxygen at 5 LPM, active smoker of 1-1/2 packs of cigarettes per day , patient presents to ED secondary to shortness of breath and altered mental status, patient with receptive worsening dyspnea over the last 4 days, she is not vaccinated against Covid, she is altered at the time of my examination, history was obtained from boyfriend at bedside, and ED staff, patient still smoking, she still using 5 L of oxygen, he is with increased work of breathing, significantly dyspneic, upon presentation to ED she is altered not provide any complaints at this point. BSE requested.  Subjective: "I haven't been able to swallow my pills for a couple weeks." Assessment / Plan / Recommendation CHL IP CLINICAL IMPRESSIONS 03/11/2020 Clinical Impression Pt presents with mild pharyngeal phase dysphagia, however appearance of anatomy appears edematous (epiglottis and aryepiglottic folds). Pt with limited dentition and requires extra time to masticate solids, swallow trigger generally at the level of the valleculae, Pt with blunted appearance of epiglottis with reduced deflection resulting in variable trace, flash penetration of thins during the swallow without aspiration and min vallecular residue with solids and brief stasis of barium tablet in  valleculae. Pt with prominent cricopharyngeus. Recommend D3/mech soft and thin liquids, po medications whole in puree and follow with liquid wash or per Pt preference. Also strongly recommend additional imaging (neck CT) and ENT consult pending those results. SLP will follow pending results of imaging. Above to RN and MD.  SLP Visit Diagnosis Dysphagia, oropharyngeal phase (R13.12) Attention and concentration deficit following -- Frontal lobe and executive function deficit following -- Impact on safety and function Mild aspiration risk   CHL IP TREATMENT RECOMMENDATION 03/11/2020 Treatment Recommendations No treatment recommended at this time   Prognosis 03/11/2020 Prognosis for Safe Diet Advancement Fair Barriers to Reach Goals Severity of deficits Barriers/Prognosis Comment -- CHL IP DIET RECOMMENDATION 03/11/2020 SLP Diet Recommendations Dysphagia 3 (Mech soft) solids;Thin liquid Liquid Administration via Cup;Straw Medication Administration Whole meds with puree Compensations Slow rate;Small sips/bites Postural Changes Remain semi-upright after after feeds/meals (Comment);Seated upright at 90  degrees   CHL IP OTHER RECOMMENDATIONS 03/11/2020 Recommended Consults Consider ENT evaluation Oral Care Recommendations Oral care BID Other Recommendations Clarify dietary restrictions   CHL IP FOLLOW UP RECOMMENDATIONS 03/11/2020 Follow up Recommendations None   CHL IP FREQUENCY AND DURATION 03/11/2020 Speech Therapy Frequency (ACUTE ONLY) min 2x/week Treatment Duration 1 week      CHL IP ORAL PHASE 03/11/2020 Oral Phase WFL Oral - Pudding Teaspoon -- Oral - Pudding Cup -- Oral - Honey Teaspoon -- Oral - Honey Cup -- Oral - Nectar Teaspoon -- Oral - Nectar Cup -- Oral - Nectar Straw -- Oral - Thin Teaspoon -- Oral - Thin Cup -- Oral - Thin Straw -- Oral - Puree -- Oral - Mech Soft -- Oral - Regular -- Oral - Multi-Consistency -- Oral - Pill -- Oral Phase - Comment --  CHL IP PHARYNGEAL PHASE 03/11/2020 Pharyngeal Phase  Impaired Pharyngeal- Pudding Teaspoon -- Pharyngeal -- Pharyngeal- Pudding Cup -- Pharyngeal -- Pharyngeal- Honey Teaspoon -- Pharyngeal -- Pharyngeal- Honey Cup -- Pharyngeal -- Pharyngeal- Nectar Teaspoon -- Pharyngeal -- Pharyngeal- Nectar Cup -- Pharyngeal -- Pharyngeal- Nectar Straw Pharyngeal residue - valleculae;Reduced epiglottic inversion Pharyngeal -- Pharyngeal- Thin Teaspoon Delayed swallow initiation-vallecula;Reduced epiglottic inversion Pharyngeal -- Pharyngeal- Thin Cup Reduced epiglottic inversion;Penetration/Aspiration during swallow Pharyngeal Material enters airway, remains ABOVE vocal cords then ejected out Pharyngeal- Thin Straw Reduced epiglottic inversion;Penetration/Aspiration during swallow Pharyngeal Material enters airway, remains ABOVE vocal cords then ejected out Pharyngeal- Puree WFL Pharyngeal -- Pharyngeal- Mechanical Soft -- Pharyngeal -- Pharyngeal- Regular Pharyngeal residue - valleculae;Reduced epiglottic inversion Pharyngeal -- Pharyngeal- Multi-consistency -- Pharyngeal -- Pharyngeal- Pill Pharyngeal residue - valleculae;Reduced epiglottic inversion Pharyngeal -- Pharyngeal Comment edematous pharynx  CHL IP CERVICAL ESOPHAGEAL PHASE 03/11/2020 Cervical Esophageal Phase Impaired Pudding Teaspoon -- Pudding Cup -- Honey Teaspoon -- Honey Cup -- Nectar Teaspoon -- Nectar Cup -- Nectar Straw -- Thin Teaspoon -- Thin Cup Prominent cricopharyngeal segment Thin Straw -- Puree -- Mechanical Soft -- Regular -- Multi-consistency -- Pill -- Cervical Esophageal Comment -- Thank you, [name], [facility] [place] 03/11/2020, 2:12 PM              ECHOCARDIOGRAM COMPLETE  Result Date: 03/11/2020    ECHOCARDIOGRAM REPORT   Patient Name:   Veronica Horton Date of Exam: 03/11/2020 Medical Rec #:  [id number]       Height:       65.0 in Accession #:    [id number]      Weight:       244.0 lb Date of Birth:  1960-08-16       BSA:          2.153 m Patient Age:    76 years         BP:           115/35 mmHg Patient Gender: F               HR:           57 bpm. Exam Location:  [facility] Procedure: 2D Echo Indications:    Dyspnea 786.09 / R06.00  History:        Patient has prior history of Echocardiogram examinations, most                 recent 01/16/2019. COPD; Risk Factors:Hypertension and Current                 Smoker. Acute respiratory failure with hypoxia.  Sonographer:    Leavy Cella  RDCS (AE) Referring Phys: [place]  1. Left ventricular ejection fraction, by estimation, is 70 to 75%. The left ventricle has hyperdynamic function. The left ventricle has no regional wall motion abnormalities. There is mild left ventricular hypertrophy. Left ventricular diastolic parameters were normal.  2. Right ventricular systolic function is normal. The right ventricular size is normal. Tricuspid regurgitation signal is inadequate for assessing PA pressure.  3. The mitral valve is grossly normal. Trivial mitral valve regurgitation.  4. The aortic valve is tricuspid. Aortic valve regurgitation is not visualized.  5. The inferior vena cava is normal in size with greater than 50% respiratory variability, suggesting right atrial pressure of 3 mmHg. FINDINGS  Left Ventricle: Left ventricular ejection fraction, by estimation, is 70 to 75%. The left ventricle has hyperdynamic function. The left ventricle has no regional wall motion abnormalities. The left ventricular internal cavity size was normal in size. There is mild left ventricular hypertrophy. Left ventricular diastolic parameters were normal. Right Ventricle: The right ventricular size is normal. No increase in right ventricular wall thickness. Right ventricular systolic function is normal. Tricuspid regurgitation signal is inadequate for assessing PA pressure. Left Atrium: Left atrial size was normal in size. Right Atrium: Right atrial size was normal in size. Pericardium: There is no evidence of pericardial  effusion. Mitral Valve: The mitral valve is grossly normal. Trivial mitral valve regurgitation. Tricuspid Valve: The tricuspid valve is grossly normal. Tricuspid valve regurgitation is trivial. Aortic Valve: The aortic valve is tricuspid. Aortic valve regurgitation is not visualized. Pulmonic Valve: The pulmonic valve was not well visualized. Pulmonic valve regurgitation is not visualized. Aorta: The aortic root is normal in size and structure. Venous: The inferior vena cava is normal in size with greater than 50% respiratory variability, suggesting right atrial pressure of 3 mmHg. IAS/Shunts: No atrial level shunt detected by color flow Doppler.  LEFT VENTRICLE PLAX 2D LVIDd:         4.05 cm  Diastology LVIDs:         2.30 cm  LV e' medial:    8.59 cm/s LV PW:         1.35 cm  LV E/e' medial:  12.2 LV IVS:        1.34 cm  LV e' lateral:   11.00 cm/s LVOT diam:     2.00 cm  LV E/e' lateral: 9.5 LVOT Area:     3.14 cm  RIGHT VENTRICLE RV S prime:     15.40 cm/s TAPSE (M-mode): 2.9 cm LEFT ATRIUM             Index       RIGHT ATRIUM           Index LA diam:        4.70 cm 2.18 cm/m  RA Area:     13.50 cm LA Vol (A2C):   73.3 ml 34.05 ml/m RA Volume:   35.40 ml  16.44 ml/m LA Vol (A4C):   39.3 ml 18.26 ml/m LA Biplane Vol: 55.6 ml 25.83 ml/m   AORTA Ao Root diam: 2.70 cm MITRAL VALVE MV Area (PHT): 2.69 cm     SHUNTS MV Decel Time: 282 msec     Systemic Diam: 2.00 cm MV E velocity: 105.00 cm/s MV A velocity: 57.80 cm/s MV E/A ratio:  1.82 Rozann Lesches MD Electronically signed by Rozann Lesches MD Signature Date/Time: 03/11/2020/4:59:31 PM    Final      Time Spent in minutes  30  Desiree Hane M.D on 04/08/2020 at 3:34 PM  To page go to [email or web address] - password [facility]

## 2020-04-08 NOTE — Progress Notes (Signed)
PT Cancellation Note  Patient Details Name: Veronica Horton MRN: [id number] DOB: Aug 01, 1960   Cancelled Treatment:    Reason Eval/Treat Not Completed: Other (comment) Pt politely declines today.  Requests PT to come back tomorrow.  Willl check back as schedule permits.   Alvis Edgell,KATHrine E 04/08/2020, 11:39 AM Arlyce Dice, DPT Acute Rehabilitation Services Pager: [phone] Office: [phone]

## 2020-04-08 NOTE — Progress Notes (Signed)
HEMATOLOGY-ONCOLOGY PROGRESS NOTE  SUBJECTIVE:   The patient is feeling tired this morning. She complains of abdominal pain, more so in the lower quadrants and she is not able to have a good bowel movements. She complained of some pain in the mouth, but able to eat and drink. No change in breathing.  Rest of the pertinent 10 point ROS reviewed and negative  I have reviewed the past medical history, past surgical history, social history and family history with the patient and they are unchanged from previous note.   PHYSICAL EXAMINATION: ECOG PERFORMANCE STATUS: 2 - Symptomatic, <50% confined to bed  Vitals:   04/08/20 1120 04/08/20 1152  BP:  112/60  Pulse: 65 65  Resp: 16 19  Temp:  98.9 F (37.2 C)  SpO2: 90% 93%   Filed Weights   04/04/20 0500 04/05/20 0500 04/06/20 0629  Weight: 261 lb 3.9 oz (118.5 kg) 257 lb 4.4 oz (116.7 kg) 261 lb 9.6 oz (118.7 kg)    Intake/Output from previous day: 12/08 0701 - 12/09 0700 In: 600 [P.O.:480] Out: 800 [Urine:800]  GENERAL: alert, no distress and comfortable, on trach collar Neck: Significant decrease in the LN  Ant lung fields clear. Trach site with clear mucus Abdomen. PEG site clean ,tenderness noted in the lower abdomen. BS normal. She was however holding her abdomen when she was coughing. BLE venous stasis , no change.  LABORATORY DATA:  I have reviewed the data as listed CMP Latest Ref Rng & Units 04/08/2020 04/07/2020 04/05/2020  Glucose 70 - 99 mg/dL 86 126(H) 89  BUN 6 - 20 mg/dL 31(H) 34(H) 14  Creatinine 0.44 - 1.00 mg/dL 1.18(H) 1.36(H) 1.00  Sodium 135 - 145 mmol/L 133(L) 134(L) 137  Potassium 3.5 - 5.1 mmol/L 4.8 4.4 4.4  Chloride 98 - 111 mmol/L 98 99 99  CO2 22 - 32 mmol/L 25 27 29   Calcium 8.9 - 10.3 mg/dL 8.3(L) 8.3(L) 8.5(L)  Total Protein 6.5 - 8.1 g/dL 5.5(L) - -  Total Bilirubin 0.3 - 1.2 mg/dL 0.5 - -  Alkaline Phos 38 - 126 U/L 51 - -  AST 15 - 41 U/L 14(L) - -  ALT 0 - 44 U/L 15 - -    Lab  Results  Component Value Date   WBC 6.0 04/08/2020   HGB 11.1 (L) 04/08/2020   HCT 34.7 (L) 04/08/2020   MCV 96.7 04/08/2020   PLT 151 04/08/2020   NEUTROABS 5.7 04/07/2020    CT SOFT TISSUE NECK W CONTRAST  Addendum Date: 03/11/2020   ADDENDUM REPORT: 03/11/2020 16:36 ADDENDUM: Study discussed by telephone with Dr. Jolaine Artist MEMON on 03/11/2020 at 1631 hours. Electronically Signed   By: Genevie Ann M.D.   On: 03/11/2020 16:36   Result Date: 03/11/2020 CLINICAL DATA:  59 year old female with dysphagia. Tonsillitis suspected. EXAM: CT NECK WITH CONTRAST TECHNIQUE: Multidetector CT imaging of the neck was performed using the standard protocol following the bolus administration of intravenous contrast. CONTRAST:  82mL OMNIPAQUE IOHEXOL 300 MG/ML  SOLN COMPARISON:  Chest CT 01/28/2019. FINDINGS: Pharynx and larynx: Bulky soft tissue mass throughout the bilateral supraglottic larynx and affecting the hypopharynx. Lobulated diffuse thickening of the epiglottis (series 2, image 53). Diffuse false cord and anterior commissure involvement with evidence of extension into the right paraglottic space on series 2 image 61 - where nodular direct extension of tumor and/or inseparable abnormal right level 3 lymph node protrudes on coronal image 58. Asymmetric erosion of the undersurface of the right thyroid cartilage  on image 65. Asymmetric sclerosis of the right arytenoid on image 67., and midline extension of tumor is suspected through the anterior commissure a distance of about 11 mm as seen on series 2, image 67 and sagittal image 48. All told, tumor size is estimated at 37 x 52 by 45 mm (AP by transverse by CC). There is evidence of airway compromise. The true cords may be spared as seen on series 2, image 71. Subglottic larynx is within normal limits. Above the vallecula pharyngeal contours appear normal. Normal superior parapharyngeal and retropharyngeal spaces. Salivary glands: Negative sublingual space.  Submandibular glands and parotid glands remain within normal limits. Thyroid: Negative. Lymph nodes: Malignant left level IIIb lymph node measures 17 mm short axis and 29 mm long axis (series 2, image 55 and coronal image 73). As stated above it is possible of malignant right level IIIa lymph node is inseparable from the parent tumor along the right paraglottic space (coronal image 58). Smaller but asymmetrically enlarged left level 4 nodes measure up to 9 mm short axis (coronal image 72). No other abnormal or suspicious lymph nodes identified. Vascular: Major vascular structures in the neck and at the skull base remain patent including both internal jugular veins. The left vertebral artery appears dominant. Calcified atherosclerosis at the skull base. Limited intracranial: Negative. Visualized orbits: Negative. Mastoids and visualized paranasal sinuses: Clear. Skeleton: Absent and carious dentition. Cervical spine degeneration. No suspicious osseous lesion identified. 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 No superior mediastinal lymphadenopathy. Visible axillary lymph nodes are normal. Mild dependent atelectasis.  No upper lung nodule identified. IMPRESSION: 1. 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 Recommend ENT consultation. Involvement of right laryngeal cartilages, with extension in the midline anterior to the strap muscles, and also extension through the right paraglottic space and/or inseparable malignant right level IIIa lymph node. Estimated tumor long axis 5.2 cm. 2. Malignant contralateral left level 3b lymph node is 2.9 cm long axis. Smaller indeterminate left level 4 lymph nodes. 3. No distant metastatic disease identified in the neck or upper chest. Electronically Signed: By: Genevie Ann M.D. On: 03/11/2020 16:23   IR GASTROSTOMY TUBE MOD SED  Result Date: 03/22/2020 INDICATION: Head and neck malignancy, dysphagia EXAM: FLUOROSCOPIC 16  FRENCH BALLOON RETENTION GASTROSTOMY MEDICATIONS: 1 G VANCOMYCIN; Antibiotics were administered within 1 hour of the procedure. GLUCAGON 0.5 MG IV ANESTHESIA/SEDATION: Versed 0 mg IV; Fentanyl 50 mcg IV Moderate Sedation Time:  12 MINUTES The patient was continuously monitored during the procedure by the interventional radiology nurse under my direct supervision. CONTRAST:  10 CC-administered into the gastric lumen. FLUOROSCOPY TIME:  Fluoroscopy Time: 1 minutes 36 seconds (53 mGy). COMPLICATIONS: None immediate. PROCEDURE: Informed written consent was obtained from the patient after a thorough discussion of the procedural risks, benefits and alternatives. All questions were addressed. Maximal Sterile Barrier Technique was utilized including caps, mask, sterile gowns, sterile gloves, sterile drape, hand hygiene and skin antiseptic. A timeout was performed prior to the initiation of the procedure. Previous imaging reviewed. Patient has an existing feeding tube within the stomach. This was utilized to insufflate the stomach with air. The stomach was localized beneath the left subcostal margin in the left abdomen with biplane fluoroscopy. Overlying skin marked. Under sterile conditions and local anesthesia, introducer needle was advanced into the stomach. Contrast injection confirms percutaneous needle access within the stomach. Single T tack deployed for gastropexy. Amplatz guidewire inserted. Tract dilatation performed to insert the 16 French balloon retention  gastrostomy. Balloon tip inflated with 7 cc mixture of saline and 1 cc contrast. This was retracted against the anterior gastric wall. Contrast injection confirms position in the stomach. Images obtained for documentation. T tacks secured to the skin site. A sterile dressing applied. No immediate complication. Patient tolerated the procedure well. IMPRESSION: Successful fluoroscopic 38 French balloon retention gastrostomy Electronically Signed   By: Jerilynn Mages.  Shick  M.D.   On: 03/22/2020 16:53   CT CHEST ABDOMEN PELVIS W CONTRAST  Result Date: 03/15/2020 CLINICAL DATA:  New diagnosis of head neck mass EXAM: CT CHEST, ABDOMEN, AND PELVIS WITH CONTRAST TECHNIQUE: Multidetector CT imaging of the chest, abdomen and pelvis was performed following the standard protocol during bolus administration of intravenous contrast. CONTRAST:  14mL OMNIPAQUE IOHEXOL 300 MG/ML  SOLN COMPARISON:  Prior swallowing evaluation and CT of the neck. FINDINGS: CT CHEST FINDINGS Cardiovascular: Calcified coronary artery disease. Aorta is normal caliber. 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 Central pulmonary vasculature is mildly engorged. Approximately 3 cm greatest caliber unchanged from previous exam. Unremarkable on limited venous phase assessment. Mediastinum/Nodes: Supraglottic mass partially imaged on the first image of the data set. Interval placement of tracheostomy tube with resultant pneumomediastinum in the anterior mediastinum in upper mediastinum. No adenopathy within the mediastinum or axilla. No hilar adenopathy. Rounded partial necrotic lymph node in the LEFT neck partially visualized, corresponding to level IIIb lymph node seen on the neck CT. Lungs/Pleura: Basilar consolidative changes bilaterally with similar pattern of consolidation though decreased volume loss when compared to the study of September of 2020. Airways are patent. Musculoskeletal: See below for full musculoskeletal detail. CT ABDOMEN PELVIS FINDINGS Hepatobiliary: Liver without focal lesion. Post cholecystectomy. No biliary duct dilation. Pancreas: Mild atrophy of the pancreas without focal lesion, ductal dilation or inflammation. Spleen: Spleen normal in size and contour. Adrenals/Urinary Tract: Adrenal glands are normal. Symmetric renal enhancement. No hydronephrosis. LEFT renal cysts, largest arising from the lower pole measuring 2.8 x 2.9 cm. Urinary bladder under  distended limiting assessment. Stomach/Bowel: Gastrointestinal tract with signs of colonic diverticulosis. No acute bowel process. Rectus diastasis. Postoperative changes in the midline of the abdomen related to prior ventral hernia repair. Mild bulging at the site of hernia repair, no herniation beyond the inserted mesh near the umbilicus. Vascular/Lymphatic: Calcified atheromatous plaque in the abdominal aorta. No aneurysmal dilation. There is no gastrohepatic or hepatoduodenal ligament lymphadenopathy. No retroperitoneal or mesenteric lymphadenopathy. No pelvic sidewall lymphadenopathy. Reproductive: No adnexal mass. Reproductive structures are unremarkable. Other: Post abdominal wall reconstruction with rectus diastasis, no frank hernia. Musculoskeletal: Spinal degenerative changes. Degenerative changes in glenohumeral joints and hips. Ununited fractures along posterior LEFT chest involving ribs 10 through 12. These appear subacute or chronic. These did not appear to be present on previous imaging from September of 2020. IMPRESSION: 1. Supraglottic mass partially imaged on the first image of the data set. No signs of metastatic disease to the chest, abdomen or pelvis. 2. Rounded partially necrotic lymph node in the LEFT neck partially visualized, corresponding to level IIIb lymph node seen on the neck CT. 3. Basilar consolidative changes bilaterally with similar pattern of consolidation though decreased volume loss when compared to the study of September of 2020. Findings may be related to aspiration. No definite lesions seen in the chest though these areas could obscure underlying lesions. 4. Postoperative changes with small amount of pneumomediastinum presumably related to recent tracheostomy tube insertion. 5. Ununited fractures along posterior LEFT chest involving ribs 10 through  12. These appear subacute or chronic. These did not appear to be present on previous imaging from September of 2020. 6. Post  abdominal wall reconstruction with rectus diastasis, no frank hernia. 7. Aortic atherosclerosis. Aortic Atherosclerosis (ICD10-I70.0). Electronically Signed   By: Zetta Bills M.D.   On: 03/15/2020 10:53   DG CHEST PORT 1 VIEW  Result Date: 04/03/2020 CLINICAL DATA:  Increasing shortness of breath EXAM: PORTABLE CHEST 1 VIEW COMPARISON:  03/31/2020 FINDINGS: Tracheostomy tube is noted in satisfactory position. Cardiac shadow is enlarged but stable. Some persistent bibasilar opacities are noted although mildly improved when compared with the prior exam. No new focal abnormality is noted. IMPRESSION: Improved aeration as described. Electronically Signed   By: Inez Catalina M.D.   On: 04/03/2020 11:59   DG CHEST PORT 1 VIEW  Result Date: 03/31/2020 CLINICAL DATA:  Pleural effusion EXAM: PORTABLE CHEST 1 VIEW COMPARISON:  03/29/2020 FINDINGS: Tracheostomy unchanged. Lung volumes are small, however, pulmonary insufflation remains stable since prior examination. Bilateral pleural effusions are present small and decreased in size since prior examination. Ovoid opacity within the right mid lung zone likely represents fluid within the fissure. There is bibasilar atelectasis. Cardiac size is within normal limits. No acute bone abnormality. IMPRESSION: Slight interval decrease in small bilateral pleural effusions with associated bibasilar atelectasis. Pulmonary hypoinflation. Electronically Signed   By: Fidela Salisbury MD   On: 03/31/2020 05:41   DG CHEST PORT 1 VIEW  Result Date: 03/29/2020 CLINICAL DATA:  Dyspnea EXAM: PORTABLE CHEST 1 VIEW COMPARISON:  03/25/2020 FINDINGS: Tracheostomy unchanged. Pulmonary insufflation is symmetric though has decreased slightly since prior examination. Small bilateral pleural effusions have developed with associated bibasilar atelectasis. No pneumothorax. Cardiac size within normal limits. Pulmonary vascularity is normal. No acute bone abnormality. IMPRESSION: Interval  development of small bilateral pleural effusions with bibasilar compressive atelectasis. Electronically Signed   By: Fidela Salisbury MD   On: 03/29/2020 06:00   DG CHEST PORT 1 VIEW  Result Date: 03/25/2020 CLINICAL DATA:  Hypoxia, shortness of breath, altered mental status history COPD, hypertension, history laryngeal carcinoma, tracheostomy, undergoing radiation therapy EXAM: PORTABLE CHEST 1 VIEW COMPARISON:  Portable exam 2831 hours compared to 03/23/2020 FINDINGS: Tracheostomy tube projects over tracheal air column. Normal heart size, mediastinal contours, and pulmonary vascularity. Improving opacity at LEFT lung base which could represent atelectasis or consolidation. Subsegmental atelectasis RIGHT middle lobe slightly improved. Tiny LEFT pleural effusion. Remaining lungs clear. No pneumothorax. Bones demineralized. IMPRESSION: Persistent subsegmental atelectasis at RIGHT base with improving atelectasis versus consolidation and tiny pleural effusion at LEFT base. Electronically Signed   By: Lavonia Dana M.D.   On: 03/25/2020 10:33   DG CHEST PORT 1 VIEW  Result Date: 03/23/2020 CLINICAL DATA:  Hypoxia. EXAM: PORTABLE CHEST 1 VIEW COMPARISON:  Left 19 2021. FINDINGS: Interim removal of feeding tube. Tracheostomy tube in stable position. Cardiomegaly. Persistent bibasilar atelectasis/infiltrates and small bilateral pleural effusions. Density noted over the right mid chest may represent atelectasis and or pleural fluid pseudotumor, no interim change. IMPRESSION: 1. Interim removal of feeding tube. Tracheostomy tube in stable position. 2. Stable cardiomegaly. 3. Persistent bibasilar atelectasis/infiltrates and small bilateral pleural effusions. Density of the right mid chest may represent atelectasis and or pleural fluid pseudotumor, no interim change. Electronically Signed   By: Marcello Moores  Register   On: 03/23/2020 06:39   DG CHEST PORT 1 VIEW  Result Date: 03/19/2020 CLINICAL DATA:  Pneumonia. EXAM:  PORTABLE CHEST 1 VIEW COMPARISON:  03/18/2020. FINDINGS: Tracheostomy tube, feeding tube  in stable position. Stable cardiomegaly. Improved pulmonary venous congestion. Lung volumes. Persistent left base atelectasis/infiltrate. Persistent right base subsegmental atelectasis. Elliptical density noted over the right mid chest may represent atelectasis and or fissural fluid. Pleural effusions cannot be excluded. No pneumothorax. IMPRESSION: 1. Lines and tubes in stable position. 2. Stable cardiomegaly. Improved pulmonary venous congestion. 3. Persistent left base atelectasis/infiltrate. Persistent right base subsegmental atelectasis. Elliptical density noted over the right mid chest may represent atelectasis and or fissural fluid. Small pleural effusions cannot be excluded. Electronically Signed   By: Marcello Moores  Register   On: 03/19/2020 06:03   DG Chest Port 1 View  Result Date: 03/18/2020 CLINICAL DATA:  Respiratory failure. EXAM: PORTABLE CHEST 1 VIEW COMPARISON:  03/17/2020. FINDINGS: Tracheostomy tube and feeding tube in stable position. Cardiomegaly. Mild pulmonary venous congestion. Mild bilateral interstitial prominence. Mild component of CHF cannot be excluded. Persistent left lower lobe atelectasis/infiltrate. New onset right mid lung prominent atelectatic changes. No pneumothorax. IMPRESSION: 1. Tracheostomy tube and feeding tube in stable position. 2. Cardiomegaly with mild pulmonary venous congestion and mild bilateral interstitial prominence. Mild component of CHF cannot be excluded. 3. Persistent left lower lobe atelectasis/infiltrate. New onset of right mid lung prominent atelectatic changes. Electronically Signed   By: Marcello Moores  Register   On: 03/18/2020 07:34   DG CHEST PORT 1 VIEW  Result Date: 03/17/2020 CLINICAL DATA:  Respiratory failure EXAM: PORTABLE CHEST 1 VIEW COMPARISON:  March 14, 2020 FINDINGS: Tracheostomy catheter tip is 6.2 cm above the carina. Feeding tube tip is below the  diaphragm. No pneumothorax. There is a small left pleural effusion with consolidation in the left lower lobe. There is right base atelectasis with equivocal small right pleural effusion. Heart is mildly enlarged, stable, with pulmonary vascularity normal. No adenopathy. No bone lesions. IMPRESSION: Tube positions as described without pneumothorax. Left pleural effusion with equivocal right pleural effusion. Airspace opacity consistent with combination of atelectasis and pneumonia left lower lobe. Mild atelectasis right base. Stable cardiac prominence. Electronically Signed   By: Lowella Grip III M.D.   On: 03/17/2020 09:02   DG CHEST PORT 1 VIEW  Result Date: 03/14/2020 CLINICAL DATA:  Respiratory failure with hypercapnia EXAM: PORTABLE CHEST 1 VIEW COMPARISON:  March 10, 2020 FINDINGS: Tracheostomy catheter tip is 6.2 cm above the carina. There is ill-defined airspace opacity in the left upper lobe and left base regions with equivocal left pleural effusion. The right lung is clear. Heart is upper normal in size with pulmonary vascularity normal. No adenopathy. No bone lesions. IMPRESSION: Tracheostomy as described without pneumothorax. Patchy airspace opacity consistent with scattered areas of pneumonia in the left upper lobe and left base. Equivocal left pleural effusion. Right lung clear. Stable cardiac silhouette. Electronically Signed   By: Lowella Grip III M.D.   On: 03/14/2020 09:17   DG Chest Portable 1 View  Result Date: 03/10/2020 CLINICAL DATA:  Shortness of breath and cough EXAM: PORTABLE CHEST 1 VIEW COMPARISON:  January 23, 2020 FINDINGS: There is bibasilar atelectatic change. Elsewhere the interstitium is mildly thickened. No consolidation. Heart size and pulmonary vascularity are normal. No adenopathy. There is degenerative change in each shoulder. IMPRESSION: Bibasilar atelectasis. Interstitial thickening likely represents a degree of underlying chronic bronchitis. No edema  or airspace opacity. Heart size within normal limits. Electronically Signed   By: Lowella Grip III M.D.   On: 03/10/2020 12:42   DG Abd Portable 1V  Result Date: 04/07/2020 CLINICAL DATA:  Abdomen pain EXAM: PORTABLE ABDOMEN - 1 VIEW  COMPARISON:  CT 03/15/2020 FINDINGS: Gastrostomy tube in the left mid abdomen. Nonobstructed bowel-gas pattern. Evidence of prior hernia repair. Curvilinear densities in the right lower quadrant, possible calcifications or retained contrast within appendix. IMPRESSION: Nonobstructed gas pattern. Electronically Signed   By: Donavan Foil M.D.   On: 04/07/2020 21:16   DG Swallowing Func-Speech Pathology  Result Date: 03/27/2020 Objective Swallowing Evaluation: Type of Study: MBS-Modified Barium Swallow Study  Patient Details Name: NOLAH KRENZER MRN: [id number] Date of Birth: [birth year] Today's Date: 03/27/2020 Time: SLP Start Time (ACUTE ONLY): 4496 -SLP Stop Time (ACUTE ONLY): 1235 SLP Time Calculation (min) (ACUTE ONLY): 20 min Past Medical History: Past Medical History: Diagnosis Date . Bronchitis  . Class 3 obesity 01/23/2020 . COPD (chronic obstructive pulmonary disease) ([place])  . Degenerative disc disease, lumbar  . Hiatal hernia  . Hypertension  Past Surgical History: Past Surgical History: Procedure Laterality Date . CHOLECYSTECTOMY   . degenerative bone disease   . DIRECT LARYNGOSCOPY N/A 03/13/2020  Procedure: DIRECT LARYNGOSCOPY WITH BIOPSY;  Surgeon: Jason Coop, DO;  Location: [place] [place];  Service: ENT;  Laterality: N/A; . INCISIONAL HERNIA REPAIR N/A 01/15/2019  Procedure: [name] HERNIORRHAPHY WITH MESH;  Surgeon: [name], MD;  Location: [facility];  Service: General;  Laterality: N/A; . IR GASTROSTOMY TUBE MOD SED  03/22/2020 . OMENTECTOMY N/A 01/15/2019  Procedure: OMENTECTOMY;  Surgeon: [name], MD;  Location: [facility];  Service: General;  Laterality: N/A; . TRACHEOSTOMY TUBE PLACEMENT N/A 03/13/2020  Procedure: AWAKE TRACHEOSTOMY;  Surgeon:  Jason Coop, DO;  Location: [facility];  Service: ENT;  Laterality: N/A; HPI: Jin Shockley is a 59 y/o F with history of COPD,chronic respiratory failure on home oxygen at 5 L/min, with large laryngeal mass lesion, now s/p tracheostomy with Shiley 6-0 and direct laryngoscopy and biopsy. 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  Pt is also s/p PEG for nutrition.  Plans are for chemo and radiation concurrently starting while in hospital.  Prior medical history includes smoking and some dyspnea if walking long distanced.  Subjective: pt awake in chair Assessment / Plan / Recommendation CHL IP CLINICAL IMPRESSIONS 03/27/2020 Clinical Impression Pt continues with with mild pharyngeal phase dysphagia - obstructive due to mass impacting epiglottic deflection.  In addition, she requires extra time to masticate solids due to dentition.  Swallow trigger is timely with minimal laryngeal penetration of liquid into larynx during the swallow due to decreased epiglottic deflection.  Min vallecular residue with solids without pt awareness - liquid wash assisted to clear.  Pt with prominent cricopharyngeus and appears with trace backflow near this region but view was suboptimal.  Recommend D3/mech soft and thin liquids, po medications whole in puree and follow with liquid wash.  Of note, following testing, pt did cough and slight whitish coating noted on secretions but no aspiration noted during testing.  Suspect pt has some low grade chronic secretion aspiration. SLP Visit Diagnosis Dysphagia, oropharyngeal phase (R13.12) Attention and concentration deficit following -- Frontal lobe and executive function deficit following -- Impact on safety and function Mild aspiration risk   CHL IP TREATMENT RECOMMENDATION 03/27/2020 Treatment Recommendations Therapy as outlined in treatment plan below   Prognosis 03/27/2020 Prognosis for Safe Diet Advancement Fair Barriers to Reach Goals Other (Comment)  Barriers/Prognosis Comment -- CH[facility] IP DIET RECOMMENDATION 03/27/2020 SLP Diet Recommendations Dysphagia 3 (Mech soft) solids;Thin liquid Liquid Administration via -- Medication Administration Whole meds with puree Compensations Slow rate;Small sips/bites Postural Changes --   CHL IP  OTHER RECOMMENDATIONS 03/27/2020 Recommended Consults (No Data) Oral Care Recommendations Oral care BID Other Recommendations Clarify dietary restrictions   [facility] IP FOLLOW UP RECOMMENDATIONS 03/27/2020 Follow up Recommendations Skilled Nursing facility   [facility] IP FREQUENCY AND DURATION 03/27/2020 Speech Therapy Frequency (ACUTE ONLY) min 1 x/week Treatment Duration 1 week      CHL IP ORAL PHASE 03/27/2020 Oral Phase WFL Oral - Pudding Teaspoon -- Oral - Pudding Cup -- Oral - Honey Teaspoon -- Oral - Honey Cup -- Oral - Nectar Teaspoon -- Oral - Nectar Cup -- Oral - Nectar Straw -- Oral - Thin Teaspoon -- Oral - Thin Cup -- Oral - Thin Straw -- Oral - Puree -- Oral - Mech Soft -- Oral - Regular -- Oral - Multi-Consistency -- Oral - Pill -- Oral Phase - Comment --  CHL IP PHARYNGEAL PHASE 03/27/2020 Pharyngeal Phase -- Pharyngeal- Pudding Teaspoon -- Pharyngeal -- Pharyngeal- Pudding Cup -- Pharyngeal -- Pharyngeal- Honey Teaspoon -- Pharyngeal -- Pharyngeal- Honey Cup -- Pharyngeal -- Pharyngeal- Nectar Teaspoon -- Pharyngeal -- Pharyngeal- Nectar Cup -- Pharyngeal -- Pharyngeal- Nectar Straw Reduced epiglottic inversion;Pharyngeal residue - valleculae Pharyngeal -- Pharyngeal- Thin Teaspoon Reduced epiglottic inversion;Pharyngeal residue - valleculae Pharyngeal -- Pharyngeal- Thin Cup Pharyngeal residue - valleculae;Reduced epiglottic inversion;Penetration/Aspiration during swallow Pharyngeal Material enters airway, remains ABOVE vocal cords then ejected out Pharyngeal- Thin Straw Reduced epiglottic inversion;Pharyngeal residue - valleculae;Penetration/Aspiration during swallow Pharyngeal Material enters airway, remains ABOVE vocal cords  then ejected out Pharyngeal- Puree WFL Pharyngeal -- Pharyngeal- Mechanical Soft -- Pharyngeal -- Pharyngeal- Regular Reduced epiglottic inversion Pharyngeal -- Pharyngeal- Multi-consistency -- Pharyngeal -- Pharyngeal- Pill NT Pharyngeal -- Pharyngeal Comment --  CHL IP CERVICAL ESOPHAGEAL PHASE 03/27/2020 Cervical Esophageal Phase -- Pudding Teaspoon -- Pudding Cup -- Honey Teaspoon -- Honey Cup -- Nectar Teaspoon -- Nectar Cup -- Nectar Straw -- Thin Teaspoon -- Thin Cup Prominent cricopharyngeal segment Thin Straw -- Puree -- Mechanical Soft -- Regular -- Multi-consistency -- Pill -- Cervical Esophageal Comment appearance of potential minimal backflow of liquid near UES region = suboptimal view - did not backflow into pharynx/larynx [name], MS [facility] SLP Acute Rehab Services Office [phone] Pager [phone] [name] 03/27/2020, 2:31 PM              DG Swallowing Func-Speech Pathology  Result Date: 03/11/2020 Objective Swallowing Evaluation: Type of Study: MBS-Modified Barium Swallow Study  Patient Details Name: AREEBAH MEINDERS MRN: [id number] Date of Birth: 1960-05-13 Today's Date: 03/11/2020 Time: SLP Start Time (ACUTE ONLY): 1255 -SLP Stop Time (ACUTE ONLY): 1316 SLP Time Calculation (min) (ACUTE ONLY): 21 min Past Medical History: Past Medical History: Diagnosis Date . Bronchitis  . Class 3 obesity 01/23/2020 . COPD (chronic obstructive pulmonary disease) ([place])  . Degenerative disc disease, lumbar  . Hiatal hernia  . Hypertension  Past Surgical History: Past Surgical History: Procedure Laterality Date . CHOLECYSTECTOMY   . degenerative bone disease   . INCISIONAL HERNIA REPAIR N/A 01/15/2019  Procedure: [name] HERNIORRHAPHY WITH MESH;  Surgeon: [name], MD;  Location: [facility];  Service: General;  Laterality: N/A; . OMENTECTOMY N/A 01/15/2019  Procedure: OMENTECTOMY;  Surgeon: [name], MD;  Location: [facility];  Service: General;  Laterality: N/A; HPI: Charmagne Buhl  is a 59 y.o.  female, with medical history significant of class III obesity, hiatal hernia, hypertension, bronchitis, COPD, chronic respiratory failure on home oxygen at 5 LPM, active smoker of 1-1/2 packs of cigarettes per day , patient presents to ED secondary to shortness  of breath and altered mental status, patient with receptive worsening dyspnea over the last 4 days, she is not vaccinated against Covid, she is altered at the time of my examination, history was obtained from boyfriend at bedside, and ED staff, patient still smoking, she still using 5 L of oxygen, he is with increased work of breathing, significantly dyspneic, upon presentation to ED she is altered not provide any complaints at this point. BSE requested.  Subjective: "I haven't been able to swallow my pills for a couple weeks." Assessment / Plan / Recommendation CHL IP CLINICAL IMPRESSIONS 03/11/2020 Clinical Impression Pt presents with mild pharyngeal phase dysphagia, however appearance of anatomy appears edematous (epiglottis and aryepiglottic folds). Pt with limited dentition and requires extra time to masticate solids, swallow trigger generally at the level of the valleculae, Pt with blunted appearance of epiglottis with reduced deflection resulting in variable trace, flash penetration of thins during the swallow without aspiration and min vallecular residue with solids and brief stasis of barium tablet in valleculae. Pt with prominent cricopharyngeus. Recommend D3/mech soft and thin liquids, po medications whole in puree and follow with liquid wash or per Pt preference. Also strongly recommend additional imaging (neck CT) and ENT consult pending those results. SLP will follow pending results of imaging. Above to RN and MD.  SLP Visit Diagnosis Dysphagia, oropharyngeal phase (R13.12) Attention and concentration deficit following -- Frontal lobe and executive function deficit following -- Impact on safety and function Mild aspiration risk   CHL IP  TREATMENT RECOMMENDATION 03/11/2020 Treatment Recommendations No treatment recommended at this time   Prognosis 03/11/2020 Prognosis for Safe Diet Advancement Fair Barriers to Reach Goals Severity of deficits Barriers/Prognosis Comment -- CHL IP DIET RECOMMENDATION 03/11/2020 SLP Diet Recommendations Dysphagia 3 (Mech soft) solids;Thin liquid Liquid Administration via Cup;Straw Medication Administration Whole meds with puree Compensations Slow rate;Small sips/bites Postural Changes Remain semi-upright after after feeds/meals (Comment);Seated upright at 90 degrees   CHL IP OTHER RECOMMENDATIONS 03/11/2020 Recommended Consults Consider ENT evaluation Oral Care Recommendations Oral care BID Other Recommendations Clarify dietary restrictions   CHL IP FOLLOW UP RECOMMENDATIONS 03/11/2020 Follow up Recommendations None   CHL IP FREQUENCY AND DURATION 03/11/2020 Speech Therapy Frequency (ACUTE ONLY) min 2x/week Treatment Duration 1 week      CHL IP ORAL PHASE 03/11/2020 Oral Phase WFL Oral - Pudding Teaspoon -- Oral - Pudding Cup -- Oral - Honey Teaspoon -- Oral - Honey Cup -- Oral - Nectar Teaspoon -- Oral - Nectar Cup -- Oral - Nectar Straw -- Oral - Thin Teaspoon -- Oral - Thin Cup -- Oral - Thin Straw -- Oral - Puree -- Oral - Mech Soft -- Oral - Regular -- Oral - Multi-Consistency -- Oral - Pill -- Oral Phase - Comment --  CHL IP PHARYNGEAL PHASE 03/11/2020 Pharyngeal Phase Impaired Pharyngeal- Pudding Teaspoon -- Pharyngeal -- Pharyngeal- Pudding Cup -- Pharyngeal -- Pharyngeal- Honey Teaspoon -- Pharyngeal -- Pharyngeal- Honey Cup -- Pharyngeal -- Pharyngeal- Nectar Teaspoon -- Pharyngeal -- Pharyngeal- Nectar Cup -- Pharyngeal -- Pharyngeal- Nectar Straw Pharyngeal residue - valleculae;Reduced epiglottic inversion Pharyngeal -- Pharyngeal- Thin Teaspoon Delayed swallow initiation-vallecula;Reduced epiglottic inversion Pharyngeal -- Pharyngeal- Thin Cup Reduced epiglottic inversion;Penetration/Aspiration during  swallow Pharyngeal Material enters airway, remains ABOVE vocal cords then ejected out Pharyngeal- Thin Straw Reduced epiglottic inversion;Penetration/Aspiration during swallow Pharyngeal Material enters airway, remains ABOVE vocal cords then ejected out Pharyngeal- Puree WFL Pharyngeal -- Pharyngeal- Mechanical Soft -- Pharyngeal -- Pharyngeal- Regular Pharyngeal residue - valleculae;Reduced epiglottic inversion Pharyngeal -- Pharyngeal-  Multi-consistency -- Pharyngeal -- Pharyngeal- Pill Pharyngeal residue - valleculae;Reduced epiglottic inversion Pharyngeal -- Pharyngeal Comment edematous pharynx  CHL IP CERVICAL ESOPHAGEAL PHASE 03/11/2020 Cervical Esophageal Phase Impaired Pudding Teaspoon -- Pudding Cup -- Honey Teaspoon -- Honey Cup -- Nectar Teaspoon -- Nectar Cup -- Nectar Straw -- Thin Teaspoon -- Thin Cup Prominent cricopharyngeal segment Thin Straw -- Puree -- Mechanical Soft -- Regular -- Multi-consistency -- Pill -- Cervical Esophageal Comment -- Thank you, [name], Hopewell Brimfield 03/11/2020, 2:12 PM              ECHOCARDIOGRAM COMPLETE  Result Date: 03/11/2020    ECHOCARDIOGRAM REPORT   Patient Name:   MATTALYNN CRANDLE Date of Exam: 03/11/2020 Medical Rec #:  [id number]       Height:       65.0 in Accession #:    [id number]      Weight:       244.0 lb Date of Birth:  [birth year]       BSA:          2.153 m Patient Age:    78 years        BP:           115/35 mmHg Patient Gender: F               HR:           57 bpm. Exam Location:  [facility] Procedure: 2D Echo Indications:    Dyspnea 786.09 / R06.00  History:        Patient has prior history of Echocardiogram examinations, most                 recent 01/16/2019. COPD; Risk Factors:Hypertension and Current                 Smoker. Acute respiratory failure with hypoxia.  Sonographer:    Leavy Cella RDCS (AE) Referring Phys: [id number] [place] S [place] IMPRESSIONS  1. Left ventricular ejection fraction, by estimation, is 70 to  75%. The left ventricle has hyperdynamic function. The left ventricle has no regional wall motion abnormalities. There is mild left ventricular hypertrophy. Left ventricular diastolic parameters were normal.  2. Right ventricular systolic function is normal. The right ventricular size is normal. Tricuspid regurgitation signal is inadequate for assessing PA pressure.  3. The mitral valve is grossly normal. Trivial mitral valve regurgitation.  4. The aortic valve is tricuspid. Aortic valve regurgitation is not visualized.  5. The inferior vena cava is normal in size with greater than 50% respiratory variability, suggesting right atrial pressure of 3 mmHg. FINDINGS  Left Ventricle: Left ventricular ejection fraction, by estimation, is 70 to 75%. The left ventricle has hyperdynamic function. The left ventricle has no regional wall motion abnormalities. The left ventricular internal cavity size was normal in size. There is mild left ventricular hypertrophy. Left ventricular diastolic parameters were normal. Right Ventricle: The right ventricular size is normal. No increase in right ventricular wall thickness. Right ventricular systolic function is normal. Tricuspid regurgitation signal is inadequate for assessing PA pressure. Left Atrium: Left atrial size was normal in size. Right Atrium: Right atrial size was normal in size. Pericardium: There is no evidence of pericardial effusion. Mitral Valve: The mitral valve is grossly normal. Trivial mitral valve regurgitation. Tricuspid Valve: The tricuspid valve is grossly normal. Tricuspid valve regurgitation is trivial. Aortic Valve: The aortic valve is tricuspid. Aortic valve regurgitation is not visualized. Pulmonic Valve: The pulmonic valve was not  well visualized. Pulmonic valve regurgitation is not visualized. Aorta: The aortic root is normal in size and structure. Venous: The inferior vena cava is normal in size with greater than 50% respiratory variability, suggesting  right atrial pressure of 3 mmHg. IAS/Shunts: No atrial level shunt detected by color flow Doppler.  LEFT VENTRICLE PLAX 2D LVIDd:         4.05 cm  Diastology LVIDs:         2.30 cm  LV e' medial:    8.59 cm/s LV PW:         1.35 cm  LV E/e' medial:  12.2 LV IVS:        1.34 cm  LV e' lateral:   11.00 cm/s LVOT diam:     2.00 cm  LV E/e' lateral: 9.5 LVOT Area:     3.14 cm  RIGHT VENTRICLE RV S prime:     15.40 cm/s TAPSE (M-mode): 2.9 cm LEFT ATRIUM             Index       RIGHT ATRIUM           Index LA diam:        4.70 cm 2.18 cm/m  RA Area:     13.50 cm LA Vol (A2C):   73.3 ml 34.05 ml/m RA Volume:   35.40 ml  16.44 ml/m LA Vol (A4C):   39.3 ml 18.26 ml/m LA Biplane Vol: 55.6 ml 25.83 ml/m   AORTA Ao Root diam: 2.70 cm MITRAL VALVE MV Area (PHT): 2.69 cm     SHUNTS MV Decel Time: 282 msec     Systemic Diam: 2.00 cm MV E velocity: 105.00 cm/s MV A velocity: 57.80 cm/s MV E/A ratio:  1.82 Rozann Lesches MD Electronically signed by Rozann Lesches MD Signature Date/Time: 03/11/2020/4:59:31 PM    Final    ASSESSMENT AND PLAN:  59 year old Caucasian female, with medical history of heavy smoking, COPD on home oxygen 5 L/min, hypertension, presents with worsening dyspnea and altered mental status.  Work-up showed a large laryngeal mass.  Patient required urgent tracheostomy.  1.  Supraglottic laryngeal cancer, [id number] 2. COPD with acute exacerbation, on chronic home oxygen 5 L/min 3. HTN  4.  Heavy smoking history  Recommendations:  -She has T4aN2c supraglottic laryngeal cancer, with compromised airway, required urgent tracheostomy.  -Started concurrent CRT on 03/29/2020.  Tolerating chemotherapy well. Significant response in lymphadenopathy. Labs reviewed, no major cytopenias, BUN and creatinine high, likely some AKI. We will closely monitor this. New onset abdominal pain, agree with trying laxative and CT imaging to evaluate further. Upon discharge, we will arrange for out patient  follow up. Discussed plan of care with Dr Lonny Prude.

## 2020-04-09 ENCOUNTER — Ambulatory Visit
Admit: 2020-04-09 | Discharge: 2020-04-09 | Disposition: A | Discharge status: Home / Self Care | Payer: Medicaid Other | Attending: Radiation Oncology | Admitting: Radiation Oncology

## 2020-04-09 ENCOUNTER — Inpatient Hospital Stay: Payer: Medicaid Other | Attending: Medical | Admitting: Medical

## 2020-04-09 ENCOUNTER — Inpatient Hospital Stay: Payer: Medicaid Other

## 2020-04-09 ENCOUNTER — Inpatient Hospital Stay (HOSPITAL_COMMUNITY): Payer: Medicaid Other

## 2020-04-09 ENCOUNTER — Encounter: Payer: Self-pay | Admitting: General Practice

## 2020-04-09 DIAGNOSIS — C321 Malignant neoplasm of supraglottis: Secondary | ICD-10-CM | POA: Insufficient documentation

## 2020-04-09 DIAGNOSIS — Z23 Encounter for immunization: Secondary | ICD-10-CM | POA: Insufficient documentation

## 2020-04-09 DIAGNOSIS — Z5112 Encounter for antineoplastic immunotherapy: Secondary | ICD-10-CM | POA: Insufficient documentation

## 2020-04-09 DIAGNOSIS — K6289 Other specified diseases of anus and rectum: Secondary | ICD-10-CM

## 2020-04-09 LAB — CBC
HCT: 33.6 % — ABNORMAL LOW (ref 36.0–46.0)
Hemoglobin: 11.1 g/dL — ABNORMAL LOW (ref 12.0–15.0)
MCH: 31.8 pg (ref 26.0–34.0)
MCHC: 33 g/dL (ref 30.0–36.0)
MCV: 96.3 fL (ref 80.0–100.0)
Platelets: 184 10*3/uL (ref 150–400)
RBC: 3.49 MIL/uL — ABNORMAL LOW (ref 3.87–5.11)
RDW: 14.3 % (ref 11.5–15.5)
WBC: 9.5 10*3/uL (ref 4.0–10.5)
nRBC: 0 % (ref 0.0–0.2)

## 2020-04-09 LAB — COMPREHENSIVE METABOLIC PANEL
ALT: 15 U/L (ref 0–44)
AST: 16 U/L (ref 15–41)
Albumin: 2.7 g/dL — ABNORMAL LOW (ref 3.5–5.0)
Alkaline Phosphatase: 54 U/L (ref 38–126)
Anion gap: 9 (ref 5–15)
BUN: 28 mg/dL — ABNORMAL HIGH (ref 6–20)
CO2: 27 mmol/L (ref 22–32)
Calcium: 8.1 mg/dL — ABNORMAL LOW (ref 8.9–10.3)
Chloride: 99 mmol/L (ref 98–111)
Creatinine, Ser: 1.18 mg/dL — ABNORMAL HIGH (ref 0.44–1.00)
GFR, Estimated: 53 mL/min — ABNORMAL LOW (ref >60)
Glucose, Bld: 89 mg/dL (ref 70–99)
Potassium: 4.5 mmol/L (ref 3.5–5.1)
Sodium: 135 mmol/L (ref 135–145)
Total Bilirubin: 0.7 mg/dL (ref 0.3–1.2)
Total Protein: 5.6 g/dL — ABNORMAL LOW (ref 6.5–8.1)

## 2020-04-09 LAB — GLUCOSE, CAPILLARY
Glucose-Capillary: 65 mg/dL — ABNORMAL LOW (ref 70–99)
Glucose-Capillary: 93 mg/dL (ref 70–99)
Glucose-Capillary: 82 mg/dL (ref 70–99)
Glucose-Capillary: 131 mg/dL — ABNORMAL HIGH (ref 70–99)
Glucose-Capillary: 103 mg/dL — ABNORMAL HIGH (ref 70–99)
Glucose-Capillary: 119 mg/dL — ABNORMAL HIGH (ref 70–99)
Glucose-Capillary: 147 mg/dL — ABNORMAL HIGH (ref 70–99)

## 2020-04-09 MED ORDER — GLUCAGON HCL RDNA (DIAGNOSTIC) 1 MG IJ SOLR
INTRAMUSCULAR | Status: AC
  Filled 2020-04-09: qty 1

## 2020-04-09 MED ORDER — METRONIDAZOLE 500 MG PO TABS
500.0000 mg | ORAL_TABLET | Freq: Three times a day (TID) | ORAL | Status: DC
  Administered 2020-04-09 – 2020-04-13 (×10): 500 mg
  Filled 2020-04-09 (×11): qty 1

## 2020-04-09 MED ORDER — CIPROFLOXACIN IN D5W 400 MG/200ML IV SOLN
400.0000 mg | Freq: Two times a day (BID) | INTRAVENOUS | Status: DC
  Administered 2020-04-09 – 2020-04-13 (×8): 400 mg via INTRAVENOUS
  Filled 2020-04-09 (×8): qty 200

## 2020-04-09 NOTE — Progress Notes (Signed)
[facility] CSW Progress Notes  Message received from inpatient [facility] Team member, K Mahabir.  States patient will be living w aunt at discharge, sister [name] will be the contact person.  Called sister.  Family plans to transport patient to all [facility] appointments, do not need [facility].  Referral was already made so if family does need help, Transportation has the referral.  Family was given the number to Transportation in order to schedule rides as needed, stressed need to provide advance notice of rides due to distance.  Sister asked for information on [employer] (contact information given) and patient also referred to [facility] for any possible help.  Inpatient TOC team alerted that sister would like an update re discharge plans.  Edwyna Shell, LCSW Clinical Social Worker Phone:  [phone]

## 2020-04-09 NOTE — Progress Notes (Signed)
PT Cancellation Note  Patient Details Name: Veronica Horton MRN: [id number] DOB: Mar 17, 1961   Cancelled Treatment:    Reason Eval/Treat Not Completed: Other (comment), generally not feeling well. Politely declines   [facility] 04/09/2020, 1:16 PM

## 2020-04-09 NOTE — Plan of Care (Signed)
  Problem: Clinical Measurements: Goal: Respiratory complications will improve Outcome: Progressing   Problem: Pain Managment: Goal: General experience of comfort will improve Outcome: Progressing   Problem: Safety: Goal: Ability to remain free from injury will improve Outcome: Progressing   Problem: Education: Goal: Knowledge of General Education information will improve Description: Including pain rating scale, medication(s)/side effects and non-pharmacologic comfort measures Outcome: Completed/Met

## 2020-04-09 NOTE — Progress Notes (Signed)
Hypoglycemic Event  CBG: 65  Treatment: glucagon  Symptoms: asymptomatic  Follow-up CBG: Time:0450 CBG Result: 93  Possible Reasons for Event:   Comments/MD notified:Yes, Lavinia Sharps

## 2020-04-09 NOTE — TOC Progression Note (Addendum)
Transition of Care [facility]) - Progression Note    Patient Details  Name: LERONDA LEWERS MRN: [id number] Date of Birth: 1960/11/12  Transition of Care [facility]) CM/SW Contact  Torryn Fiske, [name], RN Phone Number: 04/09/2020, 9:49 AM  Clinical Narrative:Spoke to patient about d/c plans-declines SNF wants home-No [facility] agency to accept-new trach;has trach otpt appt;Nsg to continue teaching patient/family. Nsg please provide 70fr catheters/yankauers;states she can manage PEG feedings;goes to xrt/chemo. Has family support.[employer] rep Thedore Mins following for PEG supples/formula/trach supplies-already has home 02-family to bring tank to hospital @ d/c.[facility] about transport for otpt xrt/chemo-await call back.    2:37p-Spoke to sister Jacklynn Barnacle will manage transportation after d/c to/from xrt/chemo treatments;updated on currentl d/c plans-recc to contact floor for medical concerns.    Expected Discharge Plan: Home/Self Care Barriers to Discharge: Continued Medical Work up  Expected Discharge Plan and Services Expected Discharge Plan: Home/Self Care In-house Referral: Financial Counselor Discharge Planning Services: CM Consult Post Acute Care Choice: Durable Medical Equipment                   DME Arranged: Trach supplies DME Agency: [employer] Date DME Agency Contacted: 03/22/20 Time DME Agency Contacted: [phone] Representative spoke with at DME Agency: [email or web address]             Social Determinants of Health ([place]) Interventions    Readmission Risk Interventions Readmission Risk Prevention Plan 01/20/2019  Medication Screening Complete  Transportation Screening Complete  Some recent data might be hidden

## 2020-04-09 NOTE — Progress Notes (Addendum)
[facility]  PROGRESS NOTE  Veronica Horton UXN:[id number] DOB: 04-Oct-1960 DOA: 03/10/2020 PCP: [facility] date - 03/10/2020   Admitting Physician Albertine Patricia, MD  Outpatient Primary MD for the patient is [name] is a 59 y.o. year old female with medical history significant for COPD, chronic hypoxic respiratory failure on 5 L O2, hiatal hernia, morbid obesity who presented on 03/10/2020 with shortness of breath thus far have COPD exacerbation.  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  Has been at [facility] since 11/17 radiation therapy, also had PEG tube placed on 11/22.  Currently receiving chemoradiation.    Subjective  She still feels like her stool is hard.  Had a big bowel movement this morning per nursing.  Still reports that hides him pain in the lower part of her abdomen.  Worse with coughing  A & P   Lower quadrant abdominal pain, CT abdomen showing rectal wall thickening and perirectal infiltrate changes concerning for proctitis. Malignancy as well as radiation associated proctitis also on differential. Still having abdominal pain despite optimization of bowel regimen and having bowel movements, as well as fever and increase in WBC though no leukocytosis currently. -Add IV cipro an flagy (listed allergy to penicillins). IV Given poor po intake related to abdominal pain -monitor blood cultures -Discussed with on-call GI, GI will evaluate on 12/11 given nonurgent, will make n.p.o. at midnight in case of potential endoscopic procedure -bowel regimen continue Colace twice daily, add MiraLAX twice daily -Continue  PPI  Fever in the setting of likely proctitis.  T-max 100.7 overnight has no white count but WBC did increase to 9.5 from 6.   Hemodynamically remained stable. -Starting IV cipro and flagyl given concern for proctitis -Monitor blood cultures  Supraglottic cell laryngeal cancer, status post tracheostomy and PEG placement.  Patient declined total laryngectomy -Responding well to chemochterapy, discussed with Dr.Iruku -Receiving radiation  Acute on chronic hypoxic/hypercarbic respiratory failure.  Likely multifactorial etiology including COPD further exacerbated by laryngeal squamous cell carcinoma as well aspiration pneumonia.  No current evidence of wheezing, feels COPD stable, discussed with oncology status of her larger masses decreased significantly, would expect ability to decrease O2 requirements -On size 6 uncuffed Shiley placed by PCCM on 12/2 -Still requiring 10 L via trach, previous home regimen 5 L, will need to wean to be able to be safe from a disposition standpoint to go home, if unable to wean will assess with chest x-ray to ensure no contributing factors from a lung standpoint, goal SPO2 greater than 88% -Encourage incentive spirometry  AKI, improving.  Baseline creatinine point 9-1.  1.36 on 12/8, stable at 1.1.  Suspect likely prerenal given diminished appetite related to ongoing/intermittent abdominal pain. -Avoid nephrotoxins -Monitor output -Monitor BMP  COPD, no wheezing -Continue inhaler regimen  Hypoglycemia.  Closely monitor, patient declining further tube feeds given abdominal pain. -Continue close monitor CBG on insulin regimen  GERD -continue PPI     Family Communication  : None at bedside  Code Status : Full  Disposition Plan  :  Patient is from home. Anticipated d/c date: 2 to 3 days. Barriers to d/c or necessity for inpatient status:  IV cipro and flagyl per tube started for proctitis, GI consulted, still requiring fairly high amounts of oxygen currently at 10  L trach support, case management assisting with disposition patient will likely need home health therapy at  minimum Consults  : Oncology  Procedures  :    DVT Prophylaxis  :  Lovenox  MDM: The below labs and imaging reports were reviewed and summarized above.  Medication management as above.  Lab Results  Component Value Date   PLT 184 04/09/2020    Diet :  Diet Order            Diet regular Room service appropriate? Yes; Fluid consistency: Thin  Diet effective now                  Inpatient Medications Scheduled Meds: . arformoterol  15 mcg Nebulization BID  . bisacodyl  10 mg Rectal Once  . budesonide (PULMICORT) nebulizer solution  0.5 mg Nebulization BID  . chlorhexidine  15 mL Mouth Rinse BID  . docusate  100 mg Per Tube BID  . enoxaparin (LOVENOX) injection  60 mg Subcutaneous Daily  . feeding supplement  237 mL Oral TID BM  . feeding supplement (OSMOLITE 1.5 CAL)  237 mL Per Tube TID BM  . gabapentin  600 mg Oral QHS  . insulin aspart  0-9 Units Subcutaneous Q4H  . ipratropium-albuterol  3 mL Nebulization BID  . ketotifen  1 drop Both Eyes BID  . lidocaine  1 patch Transdermal Q24H  . mouth rinse  15 mL Mouth Rinse q12n4p  . melatonin  5 mg Oral QHS  . multivitamin with minerals  1 tablet Per Tube Daily  . nicotine  21 mg Transdermal Daily  . pantoprazole sodium  40 mg Per Tube Daily  . polyethylene glycol  17 g Per Tube BID  . scopolamine  1 patch Transdermal Q72H   Continuous Infusions: . sodium chloride     PRN Meds:.sodium chloride, acetaminophen (TYLENOL) oral liquid 160 mg/5 mL, bisacodyl, diclofenac Sodium, diphenhydrAMINE, glucagon, guaiFENesin-dextromethorphan, hydrOXYzine, ipratropium-albuterol, lidocaine (PF), magic mouthwash w/lidocaine, oxyCODONE, prochlorperazine **OR** prochlorperazine  Antibiotics  :   Anti-infectives (From admission, onward)   Start     Dose/Rate Route Frequency Ordered Stop   03/22/20 1700  vancomycin (VANCOCIN) IVPB 1000 mg/200 mL premix  Status:  Discontinued        1,000 mg 200 mL/hr over 60 Minutes Intravenous  Once  03/22/20 1605 03/23/20 0236   03/22/20 1604  vancomycin (VANCOCIN) 1-5 GM/200ML-% IVPB       Note to Pharmacy: [name]   : cabinet override      03/22/20 1604 03/22/20 1615   03/14/20 1100  levofloxacin (LEVAQUIN) IVPB 750 mg        750 mg 100 mL/hr over 90 Minutes Intravenous Daily 03/14/20 0955 03/19/20 1146   03/10/20 1730  doxycycline (VIBRAMYCIN) 100 mg in sodium chloride 0.9 % 250 mL IVPB  Status:  Discontinued        100 mg 125 mL/hr over 120 Minutes Intravenous Every 12 hours 03/10/20 1634 03/14/20 0955   03/10/20 1600  azithromycin (ZITHROMAX) 500 mg in sodium chloride 0.9 % 250 mL IVPB  Status:  Discontinued        500 mg 250 mL/hr over 60 Minutes Intravenous Every 24 hours 03/10/20 1529 03/10/20 1538   03/10/20 1500  clindamycin (CLEOCIN) IVPB 600 mg  Status:  Discontinued        600 mg 100 mL/hr over 30 Minutes Intravenous  Once 03/10/20 1458 03/10/20 1521       Objective   Vitals:   04/09/20  0827 04/09/20 1116 04/09/20 1353 04/09/20 1515  BP:   (!) 103/49   Pulse: 60 62 71 88  Resp: 17 18 18  (!) 24  Temp:   100 F (37.8 C)   TempSrc:   Oral   SpO2: 94% 95% 91% 92%  Weight:      Height:        SpO2: 92 % O2 Flow Rate (L/min): 10 L/min FiO2 (%): 60 %  Wt Readings from Last 3 Encounters:  04/09/20 125.6 kg  01/24/20 111.8 kg  01/30/19 (!) 136.3 kg     Intake/Output Summary (Last 24 hours) at 04/09/2020 1634 Last data filed at 04/09/2020 1403 Gross per 24 hour  Intake 857 ml  Output 1800 ml  Net -943 ml    Physical Exam:  Awake Alert, Oriented X 3, flat affect No new F.N deficits,  [place].AT, Normal respiratory effort on trach collar, no wheezing RRR,No Gallops,Rubs or new Murmurs,  Abd Soft, tender in lower quadrant with deep palpation,no tenderness around PEG tube No Cyanosis, No new Rash or bruise    I have personally reviewed the following:   Data Reviewed:  CBC Recent Labs  Lab 04/05/20 0517 04/07/20 0800 04/08/20 0517  04/09/20 0830  WBC 5.8 7.4 6.0 9.5  HGB 12.0 10.9* 11.1* 11.1*  HCT 37.5 33.9* 34.7* 33.6*  PLT 171 177 151 184  MCV 98.7 98.3 96.7 96.3  MCH 31.6 31.6 30.9 31.8  MCHC 32.0 32.2 32.0 33.0  RDW 14.3 14.3 14.2 14.3  LYMPHSABS 1.0 0.9  --   --   MONOABS 0.6 0.8  --   --   EOSABS 0.1 0.0  --   --   BASOSABS 0.0 0.0  --   --     Chemistries  Recent Labs  Lab 04/05/20 0517 04/07/20 0800 04/08/20 0517 04/09/20 0830  NA 137 134* 133* 135  K 4.4 4.4 4.8 4.5  CL 99 99 98 99  CO2 29 27 25 27   GLUCOSE 89 126* 86 89  BUN 14 34* 31* 28*  CREATININE 1.00 1.36* 1.18* 1.18*  CALCIUM 8.5* 8.3* 8.3* 8.1*  MG 2.1 2.3  --   --   AST  --   --  14* 16  ALT  --   --  15 15  ALKPHOS  --   --  51 54  BILITOT  --   --  0.5 0.7   ------------------------------------------------------------------------------------------------------------------ No results for input(s): CHOL, HDL, LDLCALC, TRIG, CHOLHDL, LDLDIRECT in the last 72 hours.  Lab Results  Component Value Date   HGBA1C 5.4 03/15/2020   ------------------------------------------------------------------------------------------------------------------ No results for input(s): TSH, T4TOTAL, T3FREE, THYROIDAB in the last 72 hours.  Invalid input(s): FREET3 ------------------------------------------------------------------------------------------------------------------ No results for input(s): VITAMINB12, FOLATE, FERRITIN, TIBC, IRON, RETICCTPCT in the last 72 hours.  Coagulation profile No results for input(s): INR, PROTIME in the last 168 hours.  No results for input(s): DDIMER in the last 72 hours.  Cardiac Enzymes No results for input(s): CKMB, TROPONINI, MYOGLOBIN in the last 168 hours.  Invalid input(s): CK ------------------------------------------------------------------------------------------------------------------    Component Value Date/Time   BNP 64.6 03/29/2020 0515    Micro Results Recent Results (from the  past 240 hour(s))  Culture, respiratory (non-expectorated)     Status: None   Collection Time: 04/02/20  3:36 PM   Specimen: Tracheal Aspirate; Respiratory  Result Value Ref Range Status   Specimen Description   Final    TRACHEAL ASPIRATE Performed at [facility]  [address], [place], [place] [address]    Special Requests   Final    NONE Performed at [facility], [place] [address] [place], [place] [address]    Gram Stain   Final    RARE WBC PRESENT, PREDOMINANTLY PMN RARE GRAM POSITIVE COCCI RARE GRAM NEGATIVE RODS Performed at [facility], [place] [address] [place], [place] [address]    Culture MODERATE SERRATIA MARCESCENS  Final   Report Status 04/05/2020 FINAL  Final   Organism ID, Bacteria SERRATIA MARCESCENS  Final      Susceptibility   Serratia marcescens - MIC*    CEFAZOLIN >=64 RESISTANT Resistant     CEFEPIME <=0.12 SENSITIVE Sensitive     CEFTAZIDIME <=1 SENSITIVE Sensitive     CEFTRIAXONE <=0.25 SENSITIVE Sensitive     CIPROFLOXACIN <=0.25 SENSITIVE Sensitive     GENTAMICIN <=1 SENSITIVE Sensitive     TRIMETH/SULFA <=20 SENSITIVE Sensitive     * MODERATE SERRATIA MARCESCENS    Radiology Reports CT ABDOMEN PELVIS WO CONTRAST  Result Date: 04/09/2020 CLINICAL DATA:  Intermittent lower quadrant abdominal pain, diminished bowel movements over past 3 days, fever, history COPD, hypertension, hiatal hernia EXAM: CT ABDOMEN AND PELVIS WITHOUT CONTRAST TECHNIQUE: Multidetector CT imaging of the abdomen and pelvis was performed following the standard protocol without IV contrast. Sagittal and coronal MPR images reconstructed from axial data set. COMPARISON:  03/15/2020 FINDINGS: Lower chest: Bibasilar consolidation, volume loss and pleural effusions Hepatobiliary: Gallbladder surgically absent. Liver normal appearance Pancreas: Atrophic, otherwise unremarkable Spleen: Normal appearance Adrenals/Urinary Tract: LEFT renal cysts  unchanged. Adrenal glands, kidneys, ureters, and bladder otherwise normal appearance Stomach/Bowel: Scattered radiopacities within stool. Normal appendix. Gastrostomy tube in stomach. Mild diverticulosis of distal descending and proximal sigmoid colon without evidence of diverticulitis. Significant rectal wall thickening with surrounding perirectal infiltrative changes extending into presacral space favor proctitis though tumor not excluded. Prior ventral hernia repair with recurrent hernia identified at the inferior margin of the repair containing a nonobstructed small bowel loop (see coronal images 81-82). Vascular/Lymphatic: Atherosclerotic calcifications aorta and iliac arteries without aneurysm. No adenopathy. Reproductive: Unremarkable uterus and adnexa Other: Infiltration of fat within small bowel mesentery consistent with fibrosing mesenteritis. No free air or free fluid. Musculoskeletal: Osseous demineralization. IMPRESSION: Distal colonic diverticulosis without evidence of diverticulitis. Significant rectal wall thickening with surrounding perirectal infiltrative changes extending into presacral space favor proctitis though tumor not excluded; recommend correlation with proctoscopy. Prior ventral hernia repair with recurrent hernia at the inferior margin of the repair, containing a nonobstructed small bowel loop. Changes of fibrosing mesenteritis. Bibasilar consolidation, volume loss and pleural effusions. Aortic Atherosclerosis (ICD10-I70.0). Electronically Signed   By: Lavonia Dana M.D.   On: 04/09/2020 13:27   CT SOFT TISSUE NECK W CONTRAST  Addendum Date: 03/11/2020   ADDENDUM REPORT: 03/11/2020 16:36 ADDENDUM: Study discussed by telephone with Dr. Jolaine Artist MEMON on 03/11/2020 at 1631 hours. Electronically Signed   By: Genevie Ann M.D.   On: 03/11/2020 16:36   Result Date: 03/11/2020 CLINICAL DATA:  59 year old female with dysphagia. Tonsillitis suspected. EXAM: CT NECK WITH CONTRAST TECHNIQUE:  Multidetector CT imaging of the neck was performed using the standard protocol following the bolus administration of intravenous contrast. CONTRAST:  44mL OMNIPAQUE IOHEXOL 300 MG/ML  SOLN COMPARISON:  Chest CT 01/28/2019. FINDINGS: Pharynx and larynx: Bulky soft tissue mass throughout the bilateral supraglottic larynx and affecting the hypopharynx. Lobulated diffuse thickening of the epiglottis (series 2, image 53). Diffuse false cord and anterior commissure involvement with  evidence of extension into the right paraglottic space on series 2 image 61 - where nodular direct extension of tumor and/or inseparable abnormal right level 3 lymph node protrudes on coronal image 58. Asymmetric erosion of the undersurface of the right thyroid cartilage on image 65. Asymmetric sclerosis of the right arytenoid on image 67., and midline extension of tumor is suspected through the anterior commissure a distance of about 11 mm as seen on series 2, image 67 and sagittal image 48. All told, tumor size is estimated at 37 x 52 by 45 mm (AP by transverse by CC). There is evidence of airway compromise. The true cords may be spared as seen on series 2, image 71. Subglottic larynx is within normal limits. Above the vallecula pharyngeal contours appear normal. Normal superior parapharyngeal and retropharyngeal spaces. Salivary glands: Negative sublingual space. Submandibular glands and parotid glands remain within normal limits. Thyroid: Negative. Lymph nodes: Malignant left level IIIb lymph node measures 17 mm short axis and 29 mm long axis (series 2, image 55 and coronal image 73). As stated above it is possible of malignant right level IIIa lymph node is inseparable from the parent tumor along the right paraglottic space (coronal image 58). Smaller but asymmetrically enlarged left level 4 nodes measure up to 9 mm short axis (coronal image 72). No other abnormal or suspicious lymph nodes identified. Vascular: Major vascular structures in  the neck and at the skull base remain patent including both internal jugular veins. The left vertebral artery appears dominant. Calcified atherosclerosis at the skull base. Limited intracranial: Negative. Visualized orbits: Negative. Mastoids and visualized paranasal sinuses: Clear. Skeleton: Absent and carious dentition. Cervical spine degeneration. No suspicious osseous lesion identified. 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 No superior mediastinal lymphadenopathy. Visible axillary lymph nodes are normal. Mild dependent atelectasis.  No upper lung nodule identified. IMPRESSION: 1. 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 Recommend ENT consultation. Involvement of right laryngeal cartilages, with extension in the midline anterior to the strap muscles, and also extension through the right paraglottic space and/or inseparable malignant right level IIIa lymph node. Estimated tumor long axis 5.2 cm. 2. Malignant contralateral left level 3b lymph node is 2.9 cm long axis. Smaller indeterminate left level 4 lymph nodes. 3. No distant metastatic disease identified in the neck or upper chest. Electronically Signed: By: Genevie Ann M.D. On: 03/11/2020 16:23   IR GASTROSTOMY TUBE MOD SED  Result Date: 03/22/2020 INDICATION: Head and neck malignancy, dysphagia EXAM: FLUOROSCOPIC 16 FRENCH BALLOON RETENTION GASTROSTOMY MEDICATIONS: 1 G VANCOMYCIN; Antibiotics were administered within 1 hour of the procedure. GLUCAGON 0.5 MG IV ANESTHESIA/SEDATION: Versed 0 mg IV; Fentanyl 50 mcg IV Moderate Sedation Time:  12 MINUTES The patient was continuously monitored during the procedure by the interventional radiology nurse under my direct supervision. CONTRAST:  10 CC-administered into the gastric lumen. FLUOROSCOPY TIME:  Fluoroscopy Time: 1 minutes 36 seconds (53 mGy). COMPLICATIONS: None immediate. PROCEDURE: Informed written consent was obtained from the patient after a  thorough discussion of the procedural risks, benefits and alternatives. All questions were addressed. Maximal Sterile Barrier Technique was utilized including caps, mask, sterile gowns, sterile gloves, sterile drape, hand hygiene and skin antiseptic. A timeout was performed prior to the initiation of the procedure. Previous imaging reviewed. Patient has an existing feeding tube within the stomach. This was utilized to insufflate the stomach with air. The stomach was localized beneath the left subcostal margin in the left abdomen with biplane fluoroscopy. Overlying  skin marked. Under sterile conditions and local anesthesia, introducer needle was advanced into the stomach. Contrast injection confirms percutaneous needle access within the stomach. Single T tack deployed for gastropexy. Amplatz guidewire inserted. Tract dilatation performed to insert the 16 French balloon retention gastrostomy. Balloon tip inflated with 7 cc mixture of saline and 1 cc contrast. This was retracted against the anterior gastric wall. Contrast injection confirms position in the stomach. Images obtained for documentation. T tacks secured to the skin site. A sterile dressing applied. No immediate complication. Patient tolerated the procedure well. IMPRESSION: Successful fluoroscopic 50 French balloon retention gastrostomy Electronically Signed   By: Jerilynn Mages.  Shick M.D.   On: 03/22/2020 16:53   CT CHEST ABDOMEN PELVIS W CONTRAST  Result Date: 03/15/2020 CLINICAL DATA:  New diagnosis of head neck mass EXAM: CT CHEST, ABDOMEN, AND PELVIS WITH CONTRAST TECHNIQUE: Multidetector CT imaging of the chest, abdomen and pelvis was performed following the standard protocol during bolus administration of intravenous contrast. CONTRAST:  169mL OMNIPAQUE IOHEXOL 300 MG/ML  SOLN COMPARISON:  Prior swallowing evaluation and CT of the neck. FINDINGS: CT CHEST FINDINGS Cardiovascular: Calcified coronary artery disease. Aorta is normal caliber. 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 Central pulmonary vasculature is mildly engorged. Approximately 3 cm greatest caliber unchanged from previous exam. Unremarkable on limited venous phase assessment. Mediastinum/Nodes: Supraglottic mass partially imaged on the first image of the data set. Interval placement of tracheostomy tube with resultant pneumomediastinum in the anterior mediastinum in upper mediastinum. No adenopathy within the mediastinum or axilla. No hilar adenopathy. Rounded partial necrotic lymph node in the LEFT neck partially visualized, corresponding to level IIIb lymph node seen on the neck CT. Lungs/Pleura: Basilar consolidative changes bilaterally with similar pattern of consolidation though decreased volume loss when compared to the study of September of 2020. Airways are patent. Musculoskeletal: See below for full musculoskeletal detail. CT ABDOMEN PELVIS FINDINGS Hepatobiliary: Liver without focal lesion. Post cholecystectomy. No biliary duct dilation. Pancreas: Mild atrophy of the pancreas without focal lesion, ductal dilation or inflammation. Spleen: Spleen normal in size and contour. Adrenals/Urinary Tract: Adrenal glands are normal. Symmetric renal enhancement. No hydronephrosis. LEFT renal cysts, largest arising from the lower pole measuring 2.8 x 2.9 cm. Urinary bladder under distended limiting assessment. Stomach/Bowel: Gastrointestinal tract with signs of colonic diverticulosis. No acute bowel process. Rectus diastasis. Postoperative changes in the midline of the abdomen related to prior ventral hernia repair. Mild bulging at the site of hernia repair, no herniation beyond the inserted mesh near the umbilicus. Vascular/Lymphatic: Calcified atheromatous plaque in the abdominal aorta. No aneurysmal dilation. There is no gastrohepatic or hepatoduodenal ligament lymphadenopathy. No retroperitoneal or mesenteric lymphadenopathy. No pelvic sidewall lymphadenopathy.  Reproductive: No adnexal mass. Reproductive structures are unremarkable. Other: Post abdominal wall reconstruction with rectus diastasis, no frank hernia. Musculoskeletal: Spinal degenerative changes. Degenerative changes in glenohumeral joints and hips. Ununited fractures along posterior LEFT chest involving ribs 10 through 12. These appear subacute or chronic. These did not appear to be present on previous imaging from September of 2020. IMPRESSION: 1. Supraglottic mass partially imaged on the first image of the data set. No signs of metastatic disease to the chest, abdomen or pelvis. 2. Rounded partially necrotic lymph node in the LEFT neck partially visualized, corresponding to level IIIb lymph node seen on the neck CT. 3. Basilar consolidative changes bilaterally with similar pattern of consolidation though decreased volume loss when compared to the study of September of 2020. Findings may be  related to aspiration. No definite lesions seen in the chest though these areas could obscure underlying lesions. 4. Postoperative changes with small amount of pneumomediastinum presumably related to recent tracheostomy tube insertion. 5. Ununited fractures along posterior LEFT chest involving ribs 10 through 12. These appear subacute or chronic. These did not appear to be present on previous imaging from September of 2020. 6. Post abdominal wall reconstruction with rectus diastasis, no frank hernia. 7. Aortic atherosclerosis. Aortic Atherosclerosis (ICD10-I70.0). Electronically Signed   By: Zetta Bills M.D.   On: 03/15/2020 10:53   DG CHEST PORT 1 VIEW  Result Date: 04/08/2020 CLINICAL DATA:  Respiratory failure. EXAM: PORTABLE CHEST 1 VIEW COMPARISON:  April 03, 2020 FINDINGS: There is stable tracheostomy tube positioning. Mild, diffuse chronic appearing increased interstitial lung markings are seen. Mild to moderate severity atelectasis and/or infiltrate is seen within the right lung base. This is similar in  severity when compared to the prior study. Marked severity left basilar atelectasis and/or infiltrate is seen and is increased in severity when compared to the prior exam. There is no evidence of a pleural effusion or pneumothorax. There is mild to moderate severity enlargement of the cardiac silhouette. Degenerative changes are seen throughout the thoracic spine. IMPRESSION: 1. Bibasilar atelectasis and/or infiltrate, left greater than right. Electronically Signed   By: Virgina Norfolk M.D.   On: 04/08/2020 16:45   DG CHEST PORT 1 VIEW  Result Date: 04/03/2020 CLINICAL DATA:  Increasing shortness of breath EXAM: PORTABLE CHEST 1 VIEW COMPARISON:  03/31/2020 FINDINGS: Tracheostomy tube is noted in satisfactory position. Cardiac shadow is enlarged but stable. Some persistent bibasilar opacities are noted although mildly improved when compared with the prior exam. No new focal abnormality is noted. IMPRESSION: Improved aeration as described. Electronically Signed   By: Inez Catalina M.D.   On: 04/03/2020 11:59   DG CHEST PORT 1 VIEW  Result Date: 03/31/2020 CLINICAL DATA:  Pleural effusion EXAM: PORTABLE CHEST 1 VIEW COMPARISON:  03/29/2020 FINDINGS: Tracheostomy unchanged. Lung volumes are small, however, pulmonary insufflation remains stable since prior examination. Bilateral pleural effusions are present small and decreased in size since prior examination. Ovoid opacity within the right mid lung zone likely represents fluid within the fissure. There is bibasilar atelectasis. Cardiac size is within normal limits. No acute bone abnormality. IMPRESSION: Slight interval decrease in small bilateral pleural effusions with associated bibasilar atelectasis. Pulmonary hypoinflation. Electronically Signed   By: Fidela Salisbury MD   On: 03/31/2020 05:41   DG CHEST PORT 1 VIEW  Result Date: 03/29/2020 CLINICAL DATA:  Dyspnea EXAM: PORTABLE CHEST 1 VIEW COMPARISON:  03/25/2020 FINDINGS: Tracheostomy unchanged.  Pulmonary insufflation is symmetric though has decreased slightly since prior examination. Small bilateral pleural effusions have developed with associated bibasilar atelectasis. No pneumothorax. Cardiac size within normal limits. Pulmonary vascularity is normal. No acute bone abnormality. IMPRESSION: Interval development of small bilateral pleural effusions with bibasilar compressive atelectasis. Electronically Signed   By: Fidela Salisbury MD   On: 03/29/2020 06:00   DG CHEST PORT 1 VIEW  Result Date: 03/25/2020 CLINICAL DATA:  Hypoxia, shortness of breath, altered mental status history COPD, hypertension, history laryngeal carcinoma, tracheostomy, undergoing radiation therapy EXAM: PORTABLE CHEST 1 VIEW COMPARISON:  Portable exam 2595 hours compared to 03/23/2020 FINDINGS: Tracheostomy tube projects over tracheal air column. Normal heart size, mediastinal contours, and pulmonary vascularity. Improving opacity at LEFT lung base which could represent atelectasis or consolidation. Subsegmental atelectasis RIGHT middle lobe slightly improved. Tiny LEFT pleural effusion. Remaining lungs  clear. No pneumothorax. Bones demineralized. IMPRESSION: Persistent subsegmental atelectasis at RIGHT base with improving atelectasis versus consolidation and tiny pleural effusion at LEFT base. Electronically Signed   By: Lavonia Dana M.D.   On: 03/25/2020 10:33   DG CHEST PORT 1 VIEW  Result Date: 03/23/2020 CLINICAL DATA:  Hypoxia. EXAM: PORTABLE CHEST 1 VIEW COMPARISON:  Left 19 2021. FINDINGS: Interim removal of feeding tube. Tracheostomy tube in stable position. Cardiomegaly. Persistent bibasilar atelectasis/infiltrates and small bilateral pleural effusions. Density noted over the right mid chest may represent atelectasis and or pleural fluid pseudotumor, no interim change. IMPRESSION: 1. Interim removal of feeding tube. Tracheostomy tube in stable position. 2. Stable cardiomegaly. 3. Persistent bibasilar  atelectasis/infiltrates and small bilateral pleural effusions. Density of the right mid chest may represent atelectasis and or pleural fluid pseudotumor, no interim change. Electronically Signed   By: Marcello Moores  Register   On: 03/23/2020 06:39   DG CHEST PORT 1 VIEW  Result Date: 03/19/2020 CLINICAL DATA:  Pneumonia. EXAM: PORTABLE CHEST 1 VIEW COMPARISON:  03/18/2020. FINDINGS: Tracheostomy tube, feeding tube in stable position. Stable cardiomegaly. Improved pulmonary venous congestion. Lung volumes. Persistent left base atelectasis/infiltrate. Persistent right base subsegmental atelectasis. Elliptical density noted over the right mid chest may represent atelectasis and or fissural fluid. Pleural effusions cannot be excluded. No pneumothorax. IMPRESSION: 1. Lines and tubes in stable position. 2. Stable cardiomegaly. Improved pulmonary venous congestion. 3. Persistent left base atelectasis/infiltrate. Persistent right base subsegmental atelectasis. Elliptical density noted over the right mid chest may represent atelectasis and or fissural fluid. Small pleural effusions cannot be excluded. Electronically Signed   By: Marcello Moores  Register   On: 03/19/2020 06:03   DG Chest Port 1 View  Result Date: 03/18/2020 CLINICAL DATA:  Respiratory failure. EXAM: PORTABLE CHEST 1 VIEW COMPARISON:  03/17/2020. FINDINGS: Tracheostomy tube and feeding tube in stable position. Cardiomegaly. Mild pulmonary venous congestion. Mild bilateral interstitial prominence. Mild component of CHF cannot be excluded. Persistent left lower lobe atelectasis/infiltrate. New onset right mid lung prominent atelectatic changes. No pneumothorax. IMPRESSION: 1. Tracheostomy tube and feeding tube in stable position. 2. Cardiomegaly with mild pulmonary venous congestion and mild bilateral interstitial prominence. Mild component of CHF cannot be excluded. 3. Persistent left lower lobe atelectasis/infiltrate. New onset of right mid lung prominent  atelectatic changes. Electronically Signed   By: Marcello Moores  Register   On: 03/18/2020 07:34   DG CHEST PORT 1 VIEW  Result Date: 03/17/2020 CLINICAL DATA:  Respiratory failure EXAM: PORTABLE CHEST 1 VIEW COMPARISON:  March 14, 2020 FINDINGS: Tracheostomy catheter tip is 6.2 cm above the carina. Feeding tube tip is below the diaphragm. No pneumothorax. There is a small left pleural effusion with consolidation in the left lower lobe. There is right base atelectasis with equivocal small right pleural effusion. Heart is mildly enlarged, stable, with pulmonary vascularity normal. No adenopathy. No bone lesions. IMPRESSION: Tube positions as described without pneumothorax. Left pleural effusion with equivocal right pleural effusion. Airspace opacity consistent with combination of atelectasis and pneumonia left lower lobe. Mild atelectasis right base. Stable cardiac prominence. Electronically Signed   By: Lowella Grip III M.D.   On: 03/17/2020 09:02   DG CHEST PORT 1 VIEW  Result Date: 03/14/2020 CLINICAL DATA:  Respiratory failure with hypercapnia EXAM: PORTABLE CHEST 1 VIEW COMPARISON:  March 10, 2020 FINDINGS: Tracheostomy catheter tip is 6.2 cm above the carina. There is ill-defined airspace opacity in the left upper lobe and left base regions with equivocal left pleural effusion. The right lung  is clear. Heart is upper normal in size with pulmonary vascularity normal. No adenopathy. No bone lesions. IMPRESSION: Tracheostomy as described without pneumothorax. Patchy airspace opacity consistent with scattered areas of pneumonia in the left upper lobe and left base. Equivocal left pleural effusion. Right lung clear. Stable cardiac silhouette. Electronically Signed   By: Lowella Grip III M.D.   On: 03/14/2020 09:17   DG Abd Portable 1V  Result Date: 04/07/2020 CLINICAL DATA:  Abdomen pain EXAM: PORTABLE ABDOMEN - 1 VIEW COMPARISON:  CT 03/15/2020 FINDINGS: Gastrostomy tube in the left mid  abdomen. Nonobstructed bowel-gas pattern. Evidence of prior hernia repair. Curvilinear densities in the right lower quadrant, possible calcifications or retained contrast within appendix. IMPRESSION: Nonobstructed gas pattern. Electronically Signed   By: Donavan Foil M.D.   On: 04/07/2020 21:16   DG Swallowing Func-Speech Pathology  Result Date: 03/27/2020 Objective Swallowing Evaluation: Type of Study: MBS-Modified Barium Swallow Study  Patient Details Name: Veronica Horton MRN: [id number] Date of Birth: October 06, 1960 Today's Date: 03/27/2020 Time: SLP Start Time (ACUTE ONLY): 7062 -SLP Stop Time (ACUTE ONLY): 1235 SLP Time Calculation (min) (ACUTE ONLY): 20 min Past Medical History: Past Medical History: Diagnosis Date . Bronchitis  . Class 3 obesity 01/23/2020 . COPD (chronic obstructive pulmonary disease) ([place])  . Degenerative disc disease, lumbar  . Hiatal hernia  . Hypertension  Past Surgical History: Past Surgical History: Procedure Laterality Date . CHOLECYSTECTOMY   . degenerative bone disease   . DIRECT LARYNGOSCOPY N/A 03/13/2020  Procedure: DIRECT LARYNGOSCOPY WITH BIOPSY;  Surgeon: Jason Coop, DO;  Location: [place];  Service: ENT;  Laterality: N/A; . INCISIONAL HERNIA REPAIR N/A 01/15/2019  Procedure: [name] HERNIORRHAPHY WITH MESH;  Surgeon: [name], MD;  Location: [facility];  Service: General;  Laterality: N/A; . IR GASTROSTOMY TUBE MOD SED  03/22/2020 . OMENTECTOMY N/A 01/15/2019  Procedure: OMENTECTOMY;  Surgeon: [name], MD;  Location: [facility];  Service: General;  Laterality: N/A; . TRACHEOSTOMY TUBE PLACEMENT N/A 03/13/2020  Procedure: AWAKE TRACHEOSTOMY;  Surgeon: Jason Coop, DO;  Location: [place];  Service: ENT;  Laterality: N/A; HPI: Veronica Horton is a 59 y/o F with history of COPD,chronic respiratory failure on home oxygen at 5 L/min, with large laryngeal mass lesion, now s/p tracheostomy with Shiley 6-0 and direct laryngoscopy and biopsy. 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  Pt is also s/p PEG for nutrition.  Plans are for chemo and radiation concurrently starting while in hospital.  Prior medical history includes smoking and some dyspnea if walking long distanced.  Subjective: pt awake in chair Assessment / Plan / Recommendation CHL IP CLINICAL IMPRESSIONS 03/27/2020 Clinical Impression Pt continues with with mild pharyngeal phase dysphagia - obstructive due to mass impacting epiglottic deflection.  In addition, she requires extra time to masticate solids due to dentition.  Swallow trigger is timely with minimal laryngeal penetration of liquid into larynx during the swallow due to decreased epiglottic deflection.  Min vallecular residue with solids without pt awareness - liquid wash assisted to clear.  Pt with prominent cricopharyngeus and appears with trace backflow near this region but view was suboptimal.  Recommend D3/mech soft and thin liquids, po medications whole in puree and follow with liquid wash.  Of note, following testing, pt did cough and slight whitish coating noted on secretions but no aspiration noted during testing.  Suspect pt has some low grade chronic secretion aspiration. SLP Visit Diagnosis Dysphagia, oropharyngeal phase (R13.12) Attention and concentration deficit following --  Frontal lobe and executive function deficit following -- Impact on safety and function Mild aspiration risk   CHL IP TREATMENT RECOMMENDATION 03/27/2020 Treatment Recommendations Therapy as outlined in treatment plan below   Prognosis 03/27/2020 Prognosis for Safe Diet Advancement Fair Barriers to Reach Goals Other (Comment) Barriers/Prognosis Comment -- CHL IP DIET RECOMMENDATION 03/27/2020 SLP Diet Recommendations Dysphagia 3 (Mech soft) solids;Thin liquid Liquid Administration via -- Medication Administration Whole meds with puree Compensations Slow rate;Small sips/bites Postural Changes --   CHL IP OTHER RECOMMENDATIONS 03/27/2020 Recommended  Consults (No Data) Oral Care Recommendations Oral care BID Other Recommendations Clarify dietary restrictions   CHL IP FOLLOW UP RECOMMENDATIONS 03/27/2020 Follow up Recommendations Skilled Nursing facility   [facility] IP FREQUENCY AND DURATION 03/27/2020 Speech Therapy Frequency (ACUTE ONLY) min 1 x/week Treatment Duration 1 week      CHL IP ORAL PHASE 03/27/2020 Oral Phase WFL Oral - Pudding Teaspoon -- Oral - Pudding Cup -- Oral - Honey Teaspoon -- Oral - Honey Cup -- Oral - Nectar Teaspoon -- Oral - Nectar Cup -- Oral - Nectar Straw -- Oral - Thin Teaspoon -- Oral - Thin Cup -- Oral - Thin Straw -- Oral - Puree -- Oral - Mech Soft -- Oral - Regular -- Oral - Multi-Consistency -- Oral - Pill -- Oral Phase - Comment --  CHL IP PHARYNGEAL PHASE 03/27/2020 Pharyngeal Phase -- Pharyngeal- Pudding Teaspoon -- Pharyngeal -- Pharyngeal- Pudding Cup -- Pharyngeal -- Pharyngeal- Honey Teaspoon -- Pharyngeal -- Pharyngeal- Honey Cup -- Pharyngeal -- Pharyngeal- Nectar Teaspoon -- Pharyngeal -- Pharyngeal- Nectar Cup -- Pharyngeal -- Pharyngeal- Nectar Straw Reduced epiglottic inversion;Pharyngeal residue - valleculae Pharyngeal -- Pharyngeal- Thin Teaspoon Reduced epiglottic inversion;Pharyngeal residue - valleculae Pharyngeal -- Pharyngeal- Thin Cup Pharyngeal residue - valleculae;Reduced epiglottic inversion;Penetration/Aspiration during swallow Pharyngeal Material enters airway, remains ABOVE vocal cords then ejected out Pharyngeal- Thin Straw Reduced epiglottic inversion;Pharyngeal residue - valleculae;Penetration/Aspiration during swallow Pharyngeal Material enters airway, remains ABOVE vocal cords then ejected out Pharyngeal- Puree WFL Pharyngeal -- Pharyngeal- Mechanical Soft -- Pharyngeal -- Pharyngeal- Regular Reduced epiglottic inversion Pharyngeal -- Pharyngeal- Multi-consistency -- Pharyngeal -- Pharyngeal- Pill NT Pharyngeal -- Pharyngeal Comment --  CHL IP CERVICAL ESOPHAGEAL PHASE 03/27/2020 Cervical Esophageal  Phase -- Pudding Teaspoon -- Pudding Cup -- Honey Teaspoon -- Honey Cup -- Nectar Teaspoon -- Nectar Cup -- Nectar Straw -- Thin Teaspoon -- Thin Cup Prominent cricopharyngeal segment Thin Straw -- Puree -- Mechanical Soft -- Regular -- Multi-consistency -- Pill -- Cervical Esophageal Comment appearance of potential minimal backflow of liquid near UES region = suboptimal view - did not backflow into pharynx/larynx [name], MS [facility] SLP Acute Rehab Services Office [phone] Pager [phone] [name] 03/27/2020, 2:31 PM              DG Swallowing Func-Speech Pathology  Result Date: 03/11/2020 Objective Swallowing Evaluation: Type of Study: MBS-Modified Barium Swallow Study  Patient Details Name: Veronica Horton MRN: [id number] Date of Birth: 12-18-1960 Today's Date: 03/11/2020 Time: SLP Start Time (ACUTE ONLY): 1255 -SLP Stop Time (ACUTE ONLY): 1316 SLP Time Calculation (min) (ACUTE ONLY): 21 min Past Medical History: Past Medical History: Diagnosis Date . Bronchitis  . Class 3 obesity 01/23/2020 . COPD (chronic obstructive pulmonary disease) ([place])  . Degenerative disc disease, lumbar  . Hiatal hernia  . Hypertension  Past Surgical History: Past Surgical History: Procedure Laterality Date . CHOLECYSTECTOMY   . degenerative bone disease   . INCISIONAL HERNIA REPAIR N/A 01/15/2019  Procedure: INCISIONAL HERNIORRHAPHY WITH  MESH;  Surgeon: [name], MD;  Location: [facility];  Service: General;  Laterality: N/A; . OMENTECTOMY N/A 01/15/2019  Procedure: OMENTECTOMY;  Surgeon: [name], MD;  Location: [facility];  Service: General;  Laterality: N/A; HPI: Veronica Horton  is a 59 y.o. female, with medical history significant of class III obesity, hiatal hernia, hypertension, bronchitis, COPD, chronic respiratory failure on home oxygen at 5 LPM, active smoker of 1-1/2 packs of cigarettes per day , patient presents to ED secondary to shortness of breath and altered mental status, patient with receptive worsening  dyspnea over the last 4 days, she is not vaccinated against Covid, she is altered at the time of my examination, history was obtained from boyfriend at bedside, and ED staff, patient still smoking, she still using 5 L of oxygen, he is with increased work of breathing, significantly dyspneic, upon presentation to ED she is altered not provide any complaints at this point. BSE requested.  Subjective: "I haven't been able to swallow my pills for a couple weeks." Assessment / Plan / Recommendation CHL IP CLINICAL IMPRESSIONS 03/11/2020 Clinical Impression Pt presents with mild pharyngeal phase dysphagia, however appearance of anatomy appears edematous (epiglottis and aryepiglottic folds). Pt with limited dentition and requires extra time to masticate solids, swallow trigger generally at the level of the valleculae, Pt with blunted appearance of epiglottis with reduced deflection resulting in variable trace, flash penetration of thins during the swallow without aspiration and min vallecular residue with solids and brief stasis of barium tablet in valleculae. Pt with prominent cricopharyngeus. Recommend D3/mech soft and thin liquids, po medications whole in puree and follow with liquid wash or per Pt preference. Also strongly recommend additional imaging (neck CT) and ENT consult pending those results. SLP will follow pending results of imaging. Above to RN and MD.  SLP Visit Diagnosis Dysphagia, oropharyngeal phase (R13.12) Attention and concentration deficit following -- Frontal lobe and executive function deficit following -- Impact on safety and function Mild aspiration risk   CHL IP TREATMENT RECOMMENDATION 03/11/2020 Treatment Recommendations No treatment recommended at this time   Prognosis 03/11/2020 Prognosis for Safe Diet Advancement Fair Barriers to Reach Goals Severity of deficits Barriers/Prognosis Comment -- CHL IP DIET RECOMMENDATION 03/11/2020 SLP Diet Recommendations Dysphagia 3 (Mech soft) solids;Thin  liquid Liquid Administration via Cup;Straw Medication Administration Whole meds with puree Compensations Slow rate;Small sips/bites Postural Changes Remain semi-upright after after feeds/meals (Comment);Seated upright at 90 degrees   CHL IP OTHER RECOMMENDATIONS 03/11/2020 Recommended Consults Consider ENT evaluation Oral Care Recommendations Oral care BID Other Recommendations Clarify dietary restrictions   CHL IP FOLLOW UP RECOMMENDATIONS 03/11/2020 Follow up Recommendations None   CHL IP FREQUENCY AND DURATION 03/11/2020 Speech Therapy Frequency (ACUTE ONLY) min 2x/week Treatment Duration 1 week      CHL IP ORAL PHASE 03/11/2020 Oral Phase WFL Oral - Pudding Teaspoon -- Oral - Pudding Cup -- Oral - Honey Teaspoon -- Oral - Honey Cup -- Oral - Nectar Teaspoon -- Oral - Nectar Cup -- Oral - Nectar Straw -- Oral - Thin Teaspoon -- Oral - Thin Cup -- Oral - Thin Straw -- Oral - Puree -- Oral - Mech Soft -- Oral - Regular -- Oral - Multi-Consistency -- Oral - Pill -- Oral Phase - Comment --  CHL IP PHARYNGEAL PHASE 03/11/2020 Pharyngeal Phase Impaired Pharyngeal- Pudding Teaspoon -- Pharyngeal -- Pharyngeal- Pudding Cup -- Pharyngeal -- Pharyngeal- Honey Teaspoon -- Pharyngeal -- Pharyngeal- Honey Cup -- Pharyngeal -- Pharyngeal- Nectar Teaspoon -- Pharyngeal -- Pharyngeal-  Nectar Cup -- Pharyngeal -- Pharyngeal- Nectar Straw Pharyngeal residue - valleculae;Reduced epiglottic inversion Pharyngeal -- Pharyngeal- Thin Teaspoon Delayed swallow initiation-vallecula;Reduced epiglottic inversion Pharyngeal -- Pharyngeal- Thin Cup Reduced epiglottic inversion;Penetration/Aspiration during swallow Pharyngeal Material enters airway, remains ABOVE vocal cords then ejected out Pharyngeal- Thin Straw Reduced epiglottic inversion;Penetration/Aspiration during swallow Pharyngeal Material enters airway, remains ABOVE vocal cords then ejected out Pharyngeal- Puree WFL Pharyngeal -- Pharyngeal- Mechanical Soft -- Pharyngeal --  Pharyngeal- Regular Pharyngeal residue - valleculae;Reduced epiglottic inversion Pharyngeal -- Pharyngeal- Multi-consistency -- Pharyngeal -- Pharyngeal- Pill Pharyngeal residue - valleculae;Reduced epiglottic inversion Pharyngeal -- Pharyngeal Comment edematous pharynx  CHL IP CERVICAL ESOPHAGEAL PHASE 03/11/2020 Cervical Esophageal Phase Impaired Pudding Teaspoon -- Pudding Cup -- Honey Teaspoon -- Honey Cup -- Nectar Teaspoon -- Nectar Cup -- Nectar Straw -- Thin Teaspoon -- Thin Cup Prominent cricopharyngeal segment Thin Straw -- Puree -- Mechanical Soft -- Regular -- Multi-consistency -- Pill -- Cervical Esophageal Comment -- Thank you, [name], [place] 03/11/2020, 2:12 PM              ECHOCARDIOGRAM COMPLETE  Result Date: 03/11/2020    ECHOCARDIOGRAM REPORT   Patient Name:   Veronica Horton Date of Exam: 03/11/2020 Medical Rec #:  [id number]       Height:       65.0 in Accession #:    [id number]      Weight:       244.0 lb Date of Birth:  [birth year]       BSA:          2.153 m Patient Age:    11 years        BP:           115/35 mmHg Patient Gender: F               HR:           57 bpm. Exam Location:  [facility] Procedure: 2D Echo Indications:    Dyspnea 786.09 / R06.00  History:        Patient has prior history of Echocardiogram examinations, most                 recent 01/16/2019. COPD; Risk Factors:Hypertension and Current                 Smoker. Acute respiratory failure with hypoxia.  Sonographer:    Leavy Cella RDCS (AE) Referring Phys: [id number] [place] [place] IMPRESSIONS  1. Left ventricular ejection fraction, by estimation, is 70 to 75%. The left ventricle has hyperdynamic function. The left ventricle has no regional wall motion abnormalities. There is mild left ventricular hypertrophy. Left ventricular diastolic parameters were normal.  2. Right ventricular systolic function is normal. The right ventricular size is normal. Tricuspid regurgitation signal is  inadequate for assessing PA pressure.  3. The mitral valve is grossly normal. Trivial mitral valve regurgitation.  4. The aortic valve is tricuspid. Aortic valve regurgitation is not visualized.  5. The inferior vena cava is normal in size with greater than 50% respiratory variability, suggesting right atrial pressure of 3 mmHg. FINDINGS  Left Ventricle: Left ventricular ejection fraction, by estimation, is 70 to 75%. The left ventricle has hyperdynamic function. The left ventricle has no regional wall motion abnormalities. The left ventricular internal cavity size was normal in size. There is mild left ventricular hypertrophy. Left ventricular diastolic parameters were normal. Right Ventricle: The right ventricular size is normal. No increase in right ventricular wall  thickness. Right ventricular systolic function is normal. Tricuspid regurgitation signal is inadequate for assessing PA pressure. Left Atrium: Left atrial size was normal in size. Right Atrium: Right atrial size was normal in size. Pericardium: There is no evidence of pericardial effusion. Mitral Valve: The mitral valve is grossly normal. Trivial mitral valve regurgitation. Tricuspid Valve: The tricuspid valve is grossly normal. Tricuspid valve regurgitation is trivial. Aortic Valve: The aortic valve is tricuspid. Aortic valve regurgitation is not visualized. Pulmonic Valve: The pulmonic valve was not well visualized. Pulmonic valve regurgitation is not visualized. Aorta: The aortic root is normal in size and structure. Venous: The inferior vena cava is normal in size with greater than 50% respiratory variability, suggesting right atrial pressure of 3 mmHg. IAS/Shunts: No atrial level shunt detected by color flow Doppler.  LEFT VENTRICLE PLAX 2D LVIDd:         4.05 cm  Diastology LVIDs:         2.30 cm  LV e' medial:    8.59 cm/s LV PW:         1.35 cm  LV E/e' medial:  12.2 LV IVS:        1.34 cm  LV e' lateral:   11.00 cm/s LVOT diam:     2.00 cm   LV E/e' lateral: 9.5 LVOT Area:     3.14 cm  RIGHT VENTRICLE RV S prime:     15.40 cm/s TAPSE (M-mode): 2.9 cm LEFT ATRIUM             Index       RIGHT ATRIUM           Index LA diam:        4.70 cm 2.18 cm/m  RA Area:     13.50 cm LA Vol (A2C):   73.3 ml 34.05 ml/m RA Volume:   35.40 ml  16.44 ml/m LA Vol (A4C):   39.3 ml 18.26 ml/m LA Biplane Vol: 55.6 ml 25.83 ml/m   AORTA Ao Root diam: 2.70 cm MITRAL VALVE MV Area (PHT): 2.69 cm     SHUNTS MV Decel Time: 282 msec     Systemic Diam: 2.00 cm MV E velocity: 105.00 cm/s MV A velocity: 57.80 cm/s MV E/A ratio:  1.82 Rozann Lesches MD Electronically signed by Rozann Lesches MD Signature Date/Time: 03/11/2020/4:59:31 PM    Final      Time Spent in minutes  30     Desiree Hane M.D on 04/09/2020 at 4:34 PM  To page go to [email or web address] - password [facility]

## 2020-04-10 ENCOUNTER — Encounter (HOSPITAL_COMMUNITY): Payer: Self-pay | Admitting: Anesthesiology

## 2020-04-10 LAB — BASIC METABOLIC PANEL
Anion gap: 7 (ref 5–15)
BUN: 23 mg/dL — ABNORMAL HIGH (ref 6–20)
CO2: 28 mmol/L (ref 22–32)
Calcium: 8 mg/dL — ABNORMAL LOW (ref 8.9–10.3)
Chloride: 99 mmol/L (ref 98–111)
Creatinine, Ser: 1.21 mg/dL — ABNORMAL HIGH (ref 0.44–1.00)
GFR, Estimated: 51 mL/min — ABNORMAL LOW (ref >60)
Glucose, Bld: 101 mg/dL — ABNORMAL HIGH (ref 70–99)
Potassium: 4.4 mmol/L (ref 3.5–5.1)
Sodium: 134 mmol/L — ABNORMAL LOW (ref 135–145)

## 2020-04-10 LAB — GLUCOSE, CAPILLARY
Glucose-Capillary: 107 mg/dL — ABNORMAL HIGH (ref 70–99)
Glucose-Capillary: 126 mg/dL — ABNORMAL HIGH (ref 70–99)
Glucose-Capillary: 131 mg/dL — ABNORMAL HIGH (ref 70–99)
Glucose-Capillary: 80 mg/dL (ref 70–99)
Glucose-Capillary: 86 mg/dL (ref 70–99)
Glucose-Capillary: 94 mg/dL (ref 70–99)

## 2020-04-10 LAB — CBC
HCT: 32.1 % — ABNORMAL LOW (ref 36.0–46.0)
Hemoglobin: 10.3 g/dL — ABNORMAL LOW (ref 12.0–15.0)
MCH: 31.3 pg (ref 26.0–34.0)
MCHC: 32.1 g/dL (ref 30.0–36.0)
MCV: 97.6 fL (ref 80.0–100.0)
Platelets: 157 10*3/uL (ref 150–400)
RBC: 3.29 MIL/uL — ABNORMAL LOW (ref 3.87–5.11)
RDW: 14.4 % (ref 11.5–15.5)
WBC: 7.6 10*3/uL (ref 4.0–10.5)
nRBC: 0 % (ref 0.0–0.2)

## 2020-04-10 MED ORDER — FLEET ENEMA 7-19 GM/118ML RE ENEM
1.0000 | ENEMA | Freq: Once | RECTAL | Status: AC
  Administered 2020-04-11: 07:00:00 1 via RECTAL
  Filled 2020-04-10: qty 1

## 2020-04-10 MED ORDER — FLEET ENEMA 7-19 GM/118ML RE ENEM
1.0000 | ENEMA | Freq: Once | RECTAL | Status: AC
  Administered 2020-04-10: 21:00:00 1 via RECTAL
  Filled 2020-04-10: qty 1

## 2020-04-10 MED ORDER — SODIUM CHLORIDE 0.9 % IV SOLN: INTRAVENOUS | Status: DC

## 2020-04-10 NOTE — Progress Notes (Signed)
[facility]  PROGRESS NOTE  Veronica Horton GYJ:[id number] DOB: 10-24-1960 DOA: 03/10/2020 PCP: [facility] date - 03/10/2020   Admitting Physician Albertine Patricia, MD  Outpatient Primary MD for the patient is [facility] is a 59 y.o. year old female with medical history significant for COPD, chronic hypoxic respiratory failure on 5 L O2, hiatal hernia, morbid obesity who presented on 03/10/2020 with shortness of breath thus far have COPD exacerbation.  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  Has been at [facility] since 11/17 radiation therapy, also had PEG tube placed on 11/22.  Currently receiving chemoradiation.    Subjective  Reports increasing cough, still having abdominal pain.  A & P   Lower quadrant abdominal pain, CT abdomen showing rectal wall thickening and perirectal infiltrate changes concerning for proctitis. Malignancy as well as radiation associated proctitis also on differential. Still having abdominal pain despite optimization of bowel regimen and having bowel movements, as well as fever and increase in WBC though no leukocytosis currently. - IV cipro an flagy (listed allergy to penicillins). IV Given poor po intake related to abdominal pain -monitor blood cultures -GI plans for endoscopic evaluation on 12/12, continue liquid diet, n.p.o. at midnight -bowel regimen continue Colace twice daily, add MiraLAX twice daily -Continue  PPI  Fever in the setting of likely proctitis.  T-max 100.7 on 12/10.  T-max of 100 overnight has no leukocytosis, blood pressures becoming a little bit more soft with SBP 98 with diastolic over 49 -Continue IV cipro and flagyl given concern for proctitis -Monitor blood cultures -Closely monitor blood pressure, on  telemetry  Supraglottic cell laryngeal cancer, status post tracheostomy and PEG placement.  Patient declined total laryngectomy -Responding well to chemochterapy, discussed with Dr.Iruku on 12/9 -Receiving radiation  Acute on chronic hypoxic/hypercarbic respiratory failure.  Likely multifactorial etiology including COPD further exacerbated by laryngeal squamous cell carcinoma as well aspiration pneumonia.  No current evidence of wheezing, feel COPD stable, discussed with oncology status of her larger masses decreased significantly, would expect ability to decrease O2 requirements, chest x-ray on 12/9 shows bibasilar atelectasis or infiltrate -On size 6 uncuffed Shiley placed by PCCM on 12/2 -Still requiring 10 L via trach, previous home regimen 5 L, will need to wean to be able to be safe from a disposition standpoint to go home, if unable to wean will assess with chest x-ray to ensure no contributing factors from a lung standpoint, goal SPO2 greater than 88% -Encourage incentive spirometry -If cough worsens or continues to persist with fever will broaden out coverage with antibiotics for pneumonia -Continue Pulmicort and duo nebs inhalers  AKI, improving.  Baseline creatinine 0.9-1.  Currently 1.2 suspect likely prerenal given diminished appetite related to ongoing/intermittent abdominal pain. -Avoid nephrotoxins -Monitor output -Monitor BMP  COPD, no wheezing -Continue inhaler regimen  Hypoglycemia episodes.  Closely monitor, patient declining further tube feeds given abdominal pain. -Continue close monitor CBG on insulin regimen  GERD -continue PPI     Family Communication  : None at bedside  Code Status : Full  Disposition Plan  :  Patient is from home. Anticipated d/c date: 2 to 3 days. Barriers to d/c or necessity for inpatient status:  IV cipro and flagyl per tube for proctitis, GI consulted, still requiring fairly high amounts of  oxygen currently at 10 L trach support, case  management assisting with disposition patient will likely need home health therapy at minimum Consults  : Oncology  Procedures  :    DVT Prophylaxis  :  Lovenox  MDM: The below labs and imaging reports were reviewed and summarized above.  Medication management as above.  Lab Results  Component Value Date   PLT 157 04/10/2020    Diet :  Diet Order            Diet NPO time specified  Diet effective midnight           DIET SOFT Room service appropriate? Yes; Fluid consistency: Thin  Diet effective now                  Inpatient Medications Scheduled Meds: . arformoterol  15 mcg Nebulization BID  . bisacodyl  10 mg Rectal Once  . budesonide (PULMICORT) nebulizer solution  0.5 mg Nebulization BID  . chlorhexidine  15 mL Mouth Rinse BID  . docusate  100 mg Per Tube BID  . enoxaparin (LOVENOX) injection  60 mg Subcutaneous Daily  . feeding supplement  237 mL Oral TID BM  . feeding supplement (OSMOLITE 1.5 CAL)  237 mL Per Tube TID BM  . gabapentin  600 mg Oral QHS  . insulin aspart  0-9 Units Subcutaneous Q4H  . ipratropium-albuterol  3 mL Nebulization BID  . ketotifen  1 drop Both Eyes BID  . lidocaine  1 patch Transdermal Q24H  . mouth rinse  15 mL Mouth Rinse q12n4p  . melatonin  5 mg Oral QHS  . metroNIDAZOLE  500 mg Per Tube Q8H  . multivitamin with minerals  1 tablet Per Tube Daily  . nicotine  21 mg Transdermal Daily  . pantoprazole sodium  40 mg Per Tube Daily  . polyethylene glycol  17 g Per Tube BID  . scopolamine  1 patch Transdermal Q72H  . sodium phosphate  1 enema Rectal Once  . [START ON 04/11/2020] sodium phosphate  1 enema Rectal Once   Continuous Infusions: . sodium chloride    . sodium chloride    . ciprofloxacin 400 mg (04/10/20 0605)   PRN Meds:.sodium chloride, acetaminophen (TYLENOL) oral liquid 160 mg/5 mL, bisacodyl, diclofenac Sodium, diphenhydrAMINE, glucagon, guaiFENesin-dextromethorphan, hydrOXYzine, ipratropium-albuterol, lidocaine (PF),  magic mouthwash w/lidocaine, oxyCODONE, prochlorperazine **OR** prochlorperazine  Antibiotics  :   Anti-infectives (From admission, onward)   Start     Dose/Rate Route Frequency Ordered Stop   04/09/20 1800  ciprofloxacin (CIPRO) IVPB 400 mg        400 mg 200 mL/hr over 60 Minutes Intravenous Every 12 hours 04/09/20 1705     04/09/20 1800  metroNIDAZOLE (FLAGYL) tablet 500 mg        500 mg Per Tube Every 8 hours 04/09/20 1705     03/22/20 1700  vancomycin (VANCOCIN) IVPB 1000 mg/200 mL premix  Status:  Discontinued        1,000 mg 200 mL/hr over 60 Minutes Intravenous  Once 03/22/20 1605 03/23/20 0236   03/22/20 1604  vancomycin (VANCOCIN) 1-5 GM/200ML-% IVPB       Note to Pharmacy: [name]   : cabinet override      03/22/20 1604 03/22/20 1615   03/14/20 1100  levofloxacin (LEVAQUIN) IVPB 750 mg        750 mg 100 mL/hr over 90 Minutes Intravenous Daily 03/14/20 0955 03/19/20 1146   03/10/20 1730  doxycycline (VIBRAMYCIN) 100 mg  in sodium chloride 0.9 % 250 mL IVPB  Status:  Discontinued        100 mg 125 mL/hr over 120 Minutes Intravenous Every 12 hours 03/10/20 1634 03/14/20 0955   03/10/20 1600  azithromycin (ZITHROMAX) 500 mg in sodium chloride 0.9 % 250 mL IVPB  Status:  Discontinued        500 mg 250 mL/hr over 60 Minutes Intravenous Every 24 hours 03/10/20 1529 03/10/20 1538   03/10/20 1500  clindamycin (CLEOCIN) IVPB 600 mg  Status:  Discontinued        600 mg 100 mL/hr over 30 Minutes Intravenous  Once 03/10/20 1458 03/10/20 1521       Objective   Vitals:   04/10/20 0513 04/10/20 0842 04/10/20 1132 04/10/20 1340  BP: (!) 100/55   (!) 98/49  Pulse: 65 (!) 55  61  Resp: 17 16 20 13   Temp: 98.8 F (37.1 C)   98.8 F (37.1 C)  TempSrc: Oral   Oral  SpO2: 92% 95% 91% 91%  Weight: 126.3 kg     Height:        SpO2: 91 % O2 Flow Rate (L/min): 10 L/min FiO2 (%): 60 %  Wt Readings from Last 3 Encounters:  04/10/20 126.3 kg  01/24/20 111.8 kg  01/30/19 (!)  136.3 kg     Intake/Output Summary (Last 24 hours) at 04/10/2020 1532 Last data filed at 04/10/2020 1100 Gross per 24 hour  Intake 674.81 ml  Output 700 ml  Net -25.19 ml    Physical Exam:  Awake Alert, Oriented X 3, flat affect No new F.N deficits,  [place].AT, Normal respiratory effort on trach collar, no wheezing, RRR,No Gallops,Rubs or new Murmurs,  Abd Soft, tender in lower quadrant with deep palpation,no tenderness around PEG tube No Cyanosis, No new Rash or bruise    I have personally reviewed the following:   Data Reviewed:  CBC Recent Labs  Lab 04/05/20 0517 04/07/20 0800 04/08/20 0517 04/09/20 0830 04/10/20 0617  WBC 5.8 7.4 6.0 9.5 7.6  HGB 12.0 10.9* 11.1* 11.1* 10.3*  HCT 37.5 33.9* 34.7* 33.6* 32.1*  PLT 171 177 151 184 157  MCV 98.7 98.3 96.7 96.3 97.6  MCH 31.6 31.6 30.9 31.8 31.3  MCHC 32.0 32.2 32.0 33.0 32.1  RDW 14.3 14.3 14.2 14.3 14.4  LYMPHSABS 1.0 0.9  --   --   --   MONOABS 0.6 0.8  --   --   --   EOSABS 0.1 0.0  --   --   --   BASOSABS 0.0 0.0  --   --   --     Chemistries  Recent Labs  Lab 04/05/20 0517 04/07/20 0800 04/08/20 0517 04/09/20 0830 04/10/20 0617  NA 137 134* 133* 135 134*  K 4.4 4.4 4.8 4.5 4.4  CL 99 99 98 99 99  CO2 29 27 25 27 28   GLUCOSE 89 126* 86 89 101*  BUN 14 34* 31* 28* 23*  CREATININE 1.00 1.36* 1.18* 1.18* 1.21*  CALCIUM 8.5* 8.3* 8.3* 8.1* 8.0*  MG 2.1 2.3  --   --   --   AST  --   --  14* 16  --   ALT  --   --  15 15  --   ALKPHOS  --   --  51 54  --   BILITOT  --   --  0.5 0.7  --    ------------------------------------------------------------------------------------------------------------------ No results for input(s): CHOL, HDL,  LDLCALC, TRIG, CHOLHDL, LDLDIRECT in the last 72 hours.  Lab Results  Component Value Date   HGBA1C 5.4 03/15/2020   ------------------------------------------------------------------------------------------------------------------ No results for input(s): TSH,  T4TOTAL, T3FREE, THYROIDAB in the last 72 hours.  Invalid input(s): FREET3 ------------------------------------------------------------------------------------------------------------------ No results for input(s): VITAMINB12, FOLATE, FERRITIN, TIBC, IRON, RETICCTPCT in the last 72 hours.  Coagulation profile No results for input(s): INR, PROTIME in the last 168 hours.  No results for input(s): DDIMER in the last 72 hours.  Cardiac Enzymes No results for input(s): CKMB, TROPONINI, MYOGLOBIN in the last 168 hours.  Invalid input(s): CK ------------------------------------------------------------------------------------------------------------------    Component Value Date/Time   BNP 64.6 03/29/2020 0515    Micro Results Recent Results (from the past 240 hour(s))  Culture, respiratory (non-expectorated)     Status: None   Collection Time: 04/02/20  3:36 PM   Specimen: Tracheal Aspirate; Respiratory  Result Value Ref Range Status   Specimen Description   Final    TRACHEAL ASPIRATE Performed at [facility] [address], [place], [place] [address]    Special Requests   Final    NONE Performed at [facility], [place] [address], [place], [place] [address]    Gram Stain   Final    RARE WBC PRESENT, PREDOMINANTLY PMN RARE GRAM POSITIVE COCCI RARE GRAM NEGATIVE RODS Performed at [facility], [place] [address], [place], [place] [address]    Culture MODERATE SERRATIA MARCESCENS  Final   Report Status 04/05/2020 FINAL  Final   Organism ID, Bacteria SERRATIA MARCESCENS  Final      Susceptibility   Serratia marcescens - MIC*    CEFAZOLIN >=64 RESISTANT Resistant     CEFEPIME <=0.12 SENSITIVE Sensitive     CEFTAZIDIME <=1 SENSITIVE Sensitive     CEFTRIAXONE <=0.25 SENSITIVE Sensitive     CIPROFLOXACIN <=0.25 SENSITIVE Sensitive     GENTAMICIN <=1 SENSITIVE Sensitive     TRIMETH/SULFA <=20 SENSITIVE Sensitive     * MODERATE SERRATIA  MARCESCENS  Culture, blood (routine x 2)     Status: None (Preliminary result)   Collection Time: 04/09/20  8:23 AM   Specimen: BLOOD  Result Value Ref Range Status   Specimen Description   Final    BLOOD LEFT ANTECUBITAL Performed at [facility] [address], [place], [place] [address]    Special Requests   Final    BOTTLES DRAWN AEROBIC AND ANAEROBIC Blood Culture adequate volume Performed at [facility] [address], [place], [place] [address]    Culture   Final    NO GROWTH 1 DAY Performed at [facility], [place] [address], [place], [place] [address]    Report Status PENDING  Incomplete  Culture, blood (routine x 2)     Status: None (Preliminary result)   Collection Time: 04/09/20  8:30 AM   Specimen: BLOOD  Result Value Ref Range Status   Specimen Description   Final    BLOOD LEFT HAND Performed at [facility] [address] [place], [place] [address]    Special Requests   Final    BOTTLES DRAWN AEROBIC AND ANAEROBIC Blood Culture adequate volume Performed at [facility] [address] [place], [place] [address]    Culture   Final    NO GROWTH 1 DAY Performed at [facility], [place] [address], [place],  [address]    Report Status PENDING  Incomplete  Culture,  respiratory (non-expectorated)     Status: None (Preliminary result)   Collection Time: 04/09/20  3:26 PM   Specimen: Tracheal Aspirate; Respiratory  Result Value Ref Range Status   Specimen Description   Final    TRACHEAL ASPIRATE Performed at [facility] [address], [place], [place] [address]    Special Requests   Final    NONE Performed at [facility], [place] [address] [place], [place] [address]    Gram Stain   Final    NO WBC SEEN RARE GRAM POSITIVE COCCI RARE GRAM NEGATIVE RODS    Culture   Final    TOO YOUNG TO READ Performed at [facility], [place] [address], [place], [place] [address]    Report Status PENDING  Incomplete    Radiology Reports CT ABDOMEN PELVIS WO CONTRAST  Result Date: 04/09/2020 CLINICAL DATA:  Intermittent lower quadrant abdominal pain, diminished bowel movements over past 3 days, fever, history COPD, hypertension, hiatal hernia EXAM: CT ABDOMEN AND PELVIS WITHOUT CONTRAST TECHNIQUE: Multidetector CT imaging of the abdomen and pelvis was performed following the standard protocol without IV contrast. Sagittal and coronal MPR images reconstructed from axial data set. COMPARISON:  03/15/2020 FINDINGS: Lower chest: Bibasilar consolidation, volume loss and pleural effusions Hepatobiliary: Gallbladder surgically absent. Liver normal appearance Pancreas: Atrophic, otherwise unremarkable Spleen: Normal appearance Adrenals/Urinary Tract: LEFT renal cysts unchanged. Adrenal glands, kidneys, ureters, and bladder otherwise normal appearance Stomach/Bowel: Scattered radiopacities within stool. Normal appendix. Gastrostomy tube in stomach. Mild diverticulosis of distal descending and proximal sigmoid colon without evidence of diverticulitis. Significant rectal wall thickening with surrounding perirectal infiltrative changes extending into presacral space favor proctitis though tumor not excluded. Prior ventral hernia repair with recurrent hernia identified at the inferior margin of the repair containing a nonobstructed small bowel loop (see coronal images 81-82). Vascular/Lymphatic: Atherosclerotic calcifications aorta and iliac arteries without aneurysm. No adenopathy. Reproductive: Unremarkable uterus and adnexa Other: Infiltration of fat within small bowel mesentery consistent with fibrosing mesenteritis. No free air or free fluid. Musculoskeletal: Osseous demineralization. IMPRESSION: Distal colonic diverticulosis without evidence of diverticulitis. Significant rectal wall thickening with surrounding perirectal infiltrative changes  extending into presacral space favor proctitis though tumor not excluded; recommend correlation with proctoscopy. Prior ventral hernia repair with recurrent hernia at the inferior margin of the repair, containing a nonobstructed small bowel loop. Changes of fibrosing mesenteritis. Bibasilar consolidation, volume loss and pleural effusions. Aortic Atherosclerosis (ICD10-I70.0). Electronically Signed   By: Lavonia Dana M.D.   On: 04/09/2020 13:27   CT SOFT TISSUE NECK W CONTRAST  Addendum Date: 03/11/2020   ADDENDUM REPORT: 03/11/2020 16:36 ADDENDUM: Study discussed by telephone with Dr. Jolaine Artist MEMON on 03/11/2020 at 1631 hours. Electronically Signed   By: Genevie Ann M.D.   On: 03/11/2020 16:36   Result Date: 03/11/2020 CLINICAL DATA:  59 year old female with dysphagia. Tonsillitis suspected. EXAM: CT NECK WITH CONTRAST TECHNIQUE: Multidetector CT imaging of the neck was performed using the standard protocol following the bolus administration of intravenous contrast. CONTRAST:  52mL OMNIPAQUE IOHEXOL 300 MG/ML  SOLN COMPARISON:  Chest CT 01/28/2019. FINDINGS: Pharynx and larynx: Bulky soft tissue mass throughout the bilateral supraglottic larynx and affecting the hypopharynx. Lobulated diffuse thickening of the epiglottis (series 2, image 53). Diffuse false cord and anterior commissure involvement with evidence of extension into the right paraglottic space on series 2 image 61 - where nodular direct extension of tumor and/or inseparable abnormal right level 3 lymph node protrudes on coronal  image 58. Asymmetric erosion of the undersurface of the right thyroid cartilage on image 65. Asymmetric sclerosis of the right arytenoid on image 67., and midline extension of tumor is suspected through the anterior commissure a distance of about 11 mm as seen on series 2, image 67 and sagittal image 48. All told, tumor size is estimated at 37 x 52 by 45 mm (AP by transverse by CC). There is evidence of airway compromise. The  true cords may be spared as seen on series 2, image 71. Subglottic larynx is within normal limits. Above the vallecula pharyngeal contours appear normal. Normal superior parapharyngeal and retropharyngeal spaces. Salivary glands: Negative sublingual space. Submandibular glands and parotid glands remain within normal limits. Thyroid: Negative. Lymph nodes: Malignant left level IIIb lymph node measures 17 mm short axis and 29 mm long axis (series 2, image 55 and coronal image 73). As stated above it is possible of malignant right level IIIa lymph node is inseparable from the parent tumor along the right paraglottic space (coronal image 58). Smaller but asymmetrically enlarged left level 4 nodes measure up to 9 mm short axis (coronal image 72). No other abnormal or suspicious lymph nodes identified. Vascular: Major vascular structures in the neck and at the skull base remain patent including both internal jugular veins. The left vertebral artery appears dominant. Calcified atherosclerosis at the skull base. Limited intracranial: Negative. Visualized orbits: Negative. Mastoids and visualized paranasal sinuses: Clear. Skeleton: Absent and carious dentition. Cervical spine degeneration. No suspicious osseous lesion identified. 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 No superior mediastinal lymphadenopathy. Visible axillary lymph nodes are normal. Mild dependent atelectasis.  No upper lung nodule identified. IMPRESSION: 1. 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 Recommend ENT consultation. Involvement of right laryngeal cartilages, with extension in the midline anterior to the strap muscles, and also extension through the right paraglottic space and/or inseparable malignant right level IIIa lymph node. Estimated tumor long axis 5.2 cm. 2. Malignant contralateral left level 3b lymph node is 2.9 cm long axis. Smaller indeterminate left level 4 lymph nodes. 3. No  distant metastatic disease identified in the neck or upper chest. Electronically Signed: By: Genevie Ann M.D. On: 03/11/2020 16:23   IR GASTROSTOMY TUBE MOD SED  Result Date: 03/22/2020 INDICATION: Head and neck malignancy, dysphagia EXAM: FLUOROSCOPIC 16 FRENCH BALLOON RETENTION GASTROSTOMY MEDICATIONS: 1 G VANCOMYCIN; Antibiotics were administered within 1 hour of the procedure. GLUCAGON 0.5 MG IV ANESTHESIA/SEDATION: Versed 0 mg IV; Fentanyl 50 mcg IV Moderate Sedation Time:  12 MINUTES The patient was continuously monitored during the procedure by the interventional radiology nurse under my direct supervision. CONTRAST:  10 CC-administered into the gastric lumen. FLUOROSCOPY TIME:  Fluoroscopy Time: 1 minutes 36 seconds (53 mGy). COMPLICATIONS: None immediate. PROCEDURE: Informed written consent was obtained from the patient after a thorough discussion of the procedural risks, benefits and alternatives. All questions were addressed. Maximal Sterile Barrier Technique was utilized including caps, mask, sterile gowns, sterile gloves, sterile drape, hand hygiene and skin antiseptic. A timeout was performed prior to the initiation of the procedure. Previous imaging reviewed. Patient has an existing feeding tube within the stomach. This was utilized to insufflate the stomach with air. The stomach was localized beneath the left subcostal margin in the left abdomen with biplane fluoroscopy. Overlying skin marked. Under sterile conditions and local anesthesia, introducer needle was advanced into the stomach. Contrast injection confirms percutaneous needle access within the stomach. Single T tack deployed for gastropexy. Amplatz  guidewire inserted. Tract dilatation performed to insert the 16 French balloon retention gastrostomy. Balloon tip inflated with 7 cc mixture of saline and 1 cc contrast. This was retracted against the anterior gastric wall. Contrast injection confirms position in the stomach. Images obtained for  documentation. T tacks secured to the skin site. A sterile dressing applied. No immediate complication. Patient tolerated the procedure well. IMPRESSION: Successful fluoroscopic 68 French balloon retention gastrostomy Electronically Signed   By: Jerilynn Mages.  Shick M.D.   On: 03/22/2020 16:53   CT CHEST ABDOMEN PELVIS W CONTRAST  Result Date: 03/15/2020 CLINICAL DATA:  New diagnosis of head neck mass EXAM: CT CHEST, ABDOMEN, AND PELVIS WITH CONTRAST TECHNIQUE: Multidetector CT imaging of the chest, abdomen and pelvis was performed following the standard protocol during bolus administration of intravenous contrast. CONTRAST:  119mL OMNIPAQUE IOHEXOL 300 MG/ML  SOLN COMPARISON:  Prior swallowing evaluation and CT of the neck. FINDINGS: CT CHEST FINDINGS Cardiovascular: Calcified coronary artery disease. Aorta is normal caliber. 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 Central pulmonary vasculature is mildly engorged. Approximately 3 cm greatest caliber unchanged from previous exam. Unremarkable on limited venous phase assessment. Mediastinum/Nodes: Supraglottic mass partially imaged on the first image of the data set. Interval placement of tracheostomy tube with resultant pneumomediastinum in the anterior mediastinum in upper mediastinum. No adenopathy within the mediastinum or axilla. No hilar adenopathy. Rounded partial necrotic lymph node in the LEFT neck partially visualized, corresponding to level IIIb lymph node seen on the neck CT. Lungs/Pleura: Basilar consolidative changes bilaterally with similar pattern of consolidation though decreased volume loss when compared to the study of September of 2020. Airways are patent. Musculoskeletal: See below for full musculoskeletal detail. CT ABDOMEN PELVIS FINDINGS Hepatobiliary: Liver without focal lesion. Post cholecystectomy. No biliary duct dilation. Pancreas: Mild atrophy of the pancreas without focal lesion, ductal dilation or  inflammation. Spleen: Spleen normal in size and contour. Adrenals/Urinary Tract: Adrenal glands are normal. Symmetric renal enhancement. No hydronephrosis. LEFT renal cysts, largest arising from the lower pole measuring 2.8 x 2.9 cm. Urinary bladder under distended limiting assessment. Stomach/Bowel: Gastrointestinal tract with signs of colonic diverticulosis. No acute bowel process. Rectus diastasis. Postoperative changes in the midline of the abdomen related to prior ventral hernia repair. Mild bulging at the site of hernia repair, no herniation beyond the inserted mesh near the umbilicus. Vascular/Lymphatic: Calcified atheromatous plaque in the abdominal aorta. No aneurysmal dilation. There is no gastrohepatic or hepatoduodenal ligament lymphadenopathy. No retroperitoneal or mesenteric lymphadenopathy. No pelvic sidewall lymphadenopathy. Reproductive: No adnexal mass. Reproductive structures are unremarkable. Other: Post abdominal wall reconstruction with rectus diastasis, no frank hernia. Musculoskeletal: Spinal degenerative changes. Degenerative changes in glenohumeral joints and hips. Ununited fractures along posterior LEFT chest involving ribs 10 through 12. These appear subacute or chronic. These did not appear to be present on previous imaging from September of 2020. IMPRESSION: 1. Supraglottic mass partially imaged on the first image of the data set. No signs of metastatic disease to the chest, abdomen or pelvis. 2. Rounded partially necrotic lymph node in the LEFT neck partially visualized, corresponding to level IIIb lymph node seen on the neck CT. 3. Basilar consolidative changes bilaterally with similar pattern of consolidation though decreased volume loss when compared to the study of September of 2020. Findings may be related to aspiration. No definite lesions seen in the chest though these areas could obscure underlying lesions. 4. Postoperative changes with small amount of pneumomediastinum  presumably related to recent tracheostomy  tube insertion. 5. Ununited fractures along posterior LEFT chest involving ribs 10 through 12. These appear subacute or chronic. These did not appear to be present on previous imaging from September of 2020. 6. Post abdominal wall reconstruction with rectus diastasis, no frank hernia. 7. Aortic atherosclerosis. Aortic Atherosclerosis (ICD10-I70.0). Electronically Signed   By: Zetta Bills M.D.   On: 03/15/2020 10:53   DG CHEST PORT 1 VIEW  Result Date: 04/08/2020 CLINICAL DATA:  Respiratory failure. EXAM: PORTABLE CHEST 1 VIEW COMPARISON:  April 03, 2020 FINDINGS: There is stable tracheostomy tube positioning. Mild, diffuse chronic appearing increased interstitial lung markings are seen. Mild to moderate severity atelectasis and/or infiltrate is seen within the right lung base. This is similar in severity when compared to the prior study. Marked severity left basilar atelectasis and/or infiltrate is seen and is increased in severity when compared to the prior exam. There is no evidence of a pleural effusion or pneumothorax. There is mild to moderate severity enlargement of the cardiac silhouette. Degenerative changes are seen throughout the thoracic spine. IMPRESSION: 1. Bibasilar atelectasis and/or infiltrate, left greater than right. Electronically Signed   By: Virgina Norfolk M.D.   On: 04/08/2020 16:45   DG CHEST PORT 1 VIEW  Result Date: 04/03/2020 CLINICAL DATA:  Increasing shortness of breath EXAM: PORTABLE CHEST 1 VIEW COMPARISON:  03/31/2020 FINDINGS: Tracheostomy tube is noted in satisfactory position. Cardiac shadow is enlarged but stable. Some persistent bibasilar opacities are noted although mildly improved when compared with the prior exam. No new focal abnormality is noted. IMPRESSION: Improved aeration as described. Electronically Signed   By: Inez Catalina M.D.   On: 04/03/2020 11:59   DG CHEST PORT 1 VIEW  Result Date: 03/31/2020 CLINICAL  DATA:  Pleural effusion EXAM: PORTABLE CHEST 1 VIEW COMPARISON:  03/29/2020 FINDINGS: Tracheostomy unchanged. Lung volumes are small, however, pulmonary insufflation remains stable since prior examination. Bilateral pleural effusions are present small and decreased in size since prior examination. Ovoid opacity within the right mid lung zone likely represents fluid within the fissure. There is bibasilar atelectasis. Cardiac size is within normal limits. No acute bone abnormality. IMPRESSION: Slight interval decrease in small bilateral pleural effusions with associated bibasilar atelectasis. Pulmonary hypoinflation. Electronically Signed   By: Fidela Salisbury MD   On: 03/31/2020 05:41   DG CHEST PORT 1 VIEW  Result Date: 03/29/2020 CLINICAL DATA:  Dyspnea EXAM: PORTABLE CHEST 1 VIEW COMPARISON:  03/25/2020 FINDINGS: Tracheostomy unchanged. Pulmonary insufflation is symmetric though has decreased slightly since prior examination. Small bilateral pleural effusions have developed with associated bibasilar atelectasis. No pneumothorax. Cardiac size within normal limits. Pulmonary vascularity is normal. No acute bone abnormality. IMPRESSION: Interval development of small bilateral pleural effusions with bibasilar compressive atelectasis. Electronically Signed   By: Fidela Salisbury MD   On: 03/29/2020 06:00   DG CHEST PORT 1 VIEW  Result Date: 03/25/2020 CLINICAL DATA:  Hypoxia, shortness of breath, altered mental status history COPD, hypertension, history laryngeal carcinoma, tracheostomy, undergoing radiation therapy EXAM: PORTABLE CHEST 1 VIEW COMPARISON:  Portable exam 7741 hours compared to 03/23/2020 FINDINGS: Tracheostomy tube projects over tracheal air column. Normal heart size, mediastinal contours, and pulmonary vascularity. Improving opacity at LEFT lung base which could represent atelectasis or consolidation. Subsegmental atelectasis RIGHT middle lobe slightly improved. Tiny LEFT pleural effusion.  Remaining lungs clear. No pneumothorax. Bones demineralized. IMPRESSION: Persistent subsegmental atelectasis at RIGHT base with improving atelectasis versus consolidation and tiny pleural effusion at LEFT base. Electronically Signed   By: Elta Guadeloupe  Thornton Papas M.D.   On: 03/25/2020 10:33   DG CHEST PORT 1 VIEW  Result Date: 03/23/2020 CLINICAL DATA:  Hypoxia. EXAM: PORTABLE CHEST 1 VIEW COMPARISON:  Left 19 2021. FINDINGS: Interim removal of feeding tube. Tracheostomy tube in stable position. Cardiomegaly. Persistent bibasilar atelectasis/infiltrates and small bilateral pleural effusions. Density noted over the right mid chest may represent atelectasis and or pleural fluid pseudotumor, no interim change. IMPRESSION: 1. Interim removal of feeding tube. Tracheostomy tube in stable position. 2. Stable cardiomegaly. 3. Persistent bibasilar atelectasis/infiltrates and small bilateral pleural effusions. Density of the right mid chest may represent atelectasis and or pleural fluid pseudotumor, no interim change. Electronically Signed   By: Marcello Moores  Register   On: 03/23/2020 06:39   DG CHEST PORT 1 VIEW  Result Date: 03/19/2020 CLINICAL DATA:  Pneumonia. EXAM: PORTABLE CHEST 1 VIEW COMPARISON:  03/18/2020. FINDINGS: Tracheostomy tube, feeding tube in stable position. Stable cardiomegaly. Improved pulmonary venous congestion. Lung volumes. Persistent left base atelectasis/infiltrate. Persistent right base subsegmental atelectasis. Elliptical density noted over the right mid chest may represent atelectasis and or fissural fluid. Pleural effusions cannot be excluded. No pneumothorax. IMPRESSION: 1. Lines and tubes in stable position. 2. Stable cardiomegaly. Improved pulmonary venous congestion. 3. Persistent left base atelectasis/infiltrate. Persistent right base subsegmental atelectasis. Elliptical density noted over the right mid chest may represent atelectasis and or fissural fluid. Small pleural effusions cannot be  excluded. Electronically Signed   By: Marcello Moores  Register   On: 03/19/2020 06:03   DG Chest Port 1 View  Result Date: 03/18/2020 CLINICAL DATA:  Respiratory failure. EXAM: PORTABLE CHEST 1 VIEW COMPARISON:  03/17/2020. FINDINGS: Tracheostomy tube and feeding tube in stable position. Cardiomegaly. Mild pulmonary venous congestion. Mild bilateral interstitial prominence. Mild component of CHF cannot be excluded. Persistent left lower lobe atelectasis/infiltrate. New onset right mid lung prominent atelectatic changes. No pneumothorax. IMPRESSION: 1. Tracheostomy tube and feeding tube in stable position. 2. Cardiomegaly with mild pulmonary venous congestion and mild bilateral interstitial prominence. Mild component of CHF cannot be excluded. 3. Persistent left lower lobe atelectasis/infiltrate. New onset of right mid lung prominent atelectatic changes. Electronically Signed   By: Marcello Moores  Register   On: 03/18/2020 07:34   DG CHEST PORT 1 VIEW  Result Date: 03/17/2020 CLINICAL DATA:  Respiratory failure EXAM: PORTABLE CHEST 1 VIEW COMPARISON:  March 14, 2020 FINDINGS: Tracheostomy catheter tip is 6.2 cm above the carina. Feeding tube tip is below the diaphragm. No pneumothorax. There is a small left pleural effusion with consolidation in the left lower lobe. There is right base atelectasis with equivocal small right pleural effusion. Heart is mildly enlarged, stable, with pulmonary vascularity normal. No adenopathy. No bone lesions. IMPRESSION: Tube positions as described without pneumothorax. Left pleural effusion with equivocal right pleural effusion. Airspace opacity consistent with combination of atelectasis and pneumonia left lower lobe. Mild atelectasis right base. Stable cardiac prominence. Electronically Signed   By: Lowella Grip III M.D.   On: 03/17/2020 09:02   DG CHEST PORT 1 VIEW  Result Date: 03/14/2020 CLINICAL DATA:  Respiratory failure with hypercapnia EXAM: PORTABLE CHEST 1 VIEW  COMPARISON:  March 10, 2020 FINDINGS: Tracheostomy catheter tip is 6.2 cm above the carina. There is ill-defined airspace opacity in the left upper lobe and left base regions with equivocal left pleural effusion. The right lung is clear. Heart is upper normal in size with pulmonary vascularity normal. No adenopathy. No bone lesions. IMPRESSION: Tracheostomy as described without pneumothorax. Patchy airspace opacity consistent with scattered areas of  pneumonia in the left upper lobe and left base. Equivocal left pleural effusion. Right lung clear. Stable cardiac silhouette. Electronically Signed   By: Lowella Grip III M.D.   On: 03/14/2020 09:17   DG Abd Portable 1V  Result Date: 04/07/2020 CLINICAL DATA:  Abdomen pain EXAM: PORTABLE ABDOMEN - 1 VIEW COMPARISON:  CT 03/15/2020 FINDINGS: Gastrostomy tube in the left mid abdomen. Nonobstructed bowel-gas pattern. Evidence of prior hernia repair. Curvilinear densities in the right lower quadrant, possible calcifications or retained contrast within appendix. IMPRESSION: Nonobstructed gas pattern. Electronically Signed   By: Donavan Foil M.D.   On: 04/07/2020 21:16   DG Swallowing Func-Speech Pathology  Result Date: 03/27/2020 Objective Swallowing Evaluation: Type of Study: MBS-Modified Barium Swallow Study  Patient Details Name: ANJANNETTE GAUGER MRN: [id number] Date of Birth: Jun 06, 1960 Today's Date: 03/27/2020 Time: SLP Start Time (ACUTE ONLY): 2774 -SLP Stop Time (ACUTE ONLY): 1235 SLP Time Calculation (min) (ACUTE ONLY): 20 min Past Medical History: Past Medical History: Diagnosis Date . Bronchitis  . Class 3 obesity 01/23/2020 . COPD (chronic obstructive pulmonary disease) ([place])  . Degenerative disc disease, lumbar  . Hiatal hernia  . Hypertension  Past Surgical History: Past Surgical History: Procedure Laterality Date . CHOLECYSTECTOMY   . degenerative bone disease   . DIRECT LARYNGOSCOPY N/A 03/13/2020  Procedure: DIRECT LARYNGOSCOPY WITH BIOPSY;   Surgeon: Jason Coop, DO;  Location: [facility];  Service: ENT;  Laterality: N/A; . INCISIONAL HERNIA REPAIR N/A 01/15/2019  Procedure: [name] HERNIORRHAPHY WITH MESH;  Surgeon: [name], MD;  Location: [facility];  Service: General;  Laterality: N/A; . IR GASTROSTOMY TUBE MOD SED  03/22/2020 . OMENTECTOMY N/A 01/15/2019  Procedure: OMENTECTOMY;  Surgeon: [name], MD;  Location: [facility];  Service: General;  Laterality: N/A; . TRACHEOSTOMY TUBE PLACEMENT N/A 03/13/2020  Procedure: AWAKE TRACHEOSTOMY;  Surgeon: Jason Coop, DO;  Location: [facility];  Service: ENT;  Laterality: N/A; HPI: Eunie Lawn is a 59 y/o F with history of COPD,chronic respiratory failure on home oxygen at 5 L/min, with large laryngeal mass lesion, now s/p tracheostomy with Shiley 6-0 and direct laryngoscopy and biopsy. 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  Pt is also s/p PEG for nutrition.  Plans are for chemo and radiation concurrently starting while in hospital.  Prior medical history includes smoking and some dyspnea if walking long distanced.  Subjective: pt awake in chair Assessment / Plan / Recommendation CHL IP CLINICAL IMPRESSIONS 03/27/2020 Clinical Impression Pt continues with with mild pharyngeal phase dysphagia - obstructive due to mass impacting epiglottic deflection.  In addition, she requires extra time to masticate solids due to dentition.  Swallow trigger is timely with minimal laryngeal penetration of liquid into larynx during the swallow due to decreased epiglottic deflection.  Min vallecular residue with solids without pt awareness - liquid wash assisted to clear.  Pt with prominent cricopharyngeus and appears with trace backflow near this region but view was suboptimal.  Recommend D3/mech soft and thin liquids, po medications whole in puree and follow with liquid wash.  Of note, following testing, pt did cough and slight whitish coating noted on secretions but no aspiration noted  during testing.  Suspect pt has some low grade chronic secretion aspiration. SLP Visit Diagnosis Dysphagia, oropharyngeal phase (R13.12) Attention and concentration deficit following -- Frontal lobe and executive function deficit following -- Impact on safety and function Mild aspiration risk   CHL IP TREATMENT RECOMMENDATION 03/27/2020 Treatment Recommendations Therapy as outlined in treatment plan  below   Prognosis 03/27/2020 Prognosis for Safe Diet Advancement Fair Barriers to Reach Goals Other (Comment) Barriers/Prognosis Comment -- CHL IP DIET RECOMMENDATION 03/27/2020 SLP Diet Recommendations Dysphagia 3 (Mech soft) solids;Thin liquid Liquid Administration via -- Medication Administration Whole meds with puree Compensations Slow rate;Small sips/bites Postural Changes --   CHL IP OTHER RECOMMENDATIONS 03/27/2020 Recommended Consults (No Data) Oral Care Recommendations Oral care BID Other Recommendations Clarify dietary restrictions   CHL IP FOLLOW UP RECOMMENDATIONS 03/27/2020 Follow up Recommendations Skilled Nursing facility   [facility] IP FREQUENCY AND DURATION 03/27/2020 Speech Therapy Frequency (ACUTE ONLY) min 1 x/week Treatment Duration 1 week      CHL IP ORAL PHASE 03/27/2020 Oral Phase WFL Oral - Pudding Teaspoon -- Oral - Pudding Cup -- Oral - Honey Teaspoon -- Oral - Honey Cup -- Oral - Nectar Teaspoon -- Oral - Nectar Cup -- Oral - Nectar Straw -- Oral - Thin Teaspoon -- Oral - Thin Cup -- Oral - Thin Straw -- Oral - Puree -- Oral - Mech Soft -- Oral - Regular -- Oral - Multi-Consistency -- Oral - Pill -- Oral Phase - Comment --  CHL IP PHARYNGEAL PHASE 03/27/2020 Pharyngeal Phase -- Pharyngeal- Pudding Teaspoon -- Pharyngeal -- Pharyngeal- Pudding Cup -- Pharyngeal -- Pharyngeal- Honey Teaspoon -- Pharyngeal -- Pharyngeal- Honey Cup -- Pharyngeal -- Pharyngeal- Nectar Teaspoon -- Pharyngeal -- Pharyngeal- Nectar Cup -- Pharyngeal -- Pharyngeal- Nectar Straw Reduced epiglottic inversion;Pharyngeal residue  - valleculae Pharyngeal -- Pharyngeal- Thin Teaspoon Reduced epiglottic inversion;Pharyngeal residue - valleculae Pharyngeal -- Pharyngeal- Thin Cup Pharyngeal residue - valleculae;Reduced epiglottic inversion;Penetration/Aspiration during swallow Pharyngeal Material enters airway, remains ABOVE vocal cords then ejected out Pharyngeal- Thin Straw Reduced epiglottic inversion;Pharyngeal residue - valleculae;Penetration/Aspiration during swallow Pharyngeal Material enters airway, remains ABOVE vocal cords then ejected out Pharyngeal- Puree WFL Pharyngeal -- Pharyngeal- Mechanical Soft -- Pharyngeal -- Pharyngeal- Regular Reduced epiglottic inversion Pharyngeal -- Pharyngeal- Multi-consistency -- Pharyngeal -- Pharyngeal- Pill NT Pharyngeal -- Pharyngeal Comment --  CHL IP CERVICAL ESOPHAGEAL PHASE 03/27/2020 Cervical Esophageal Phase -- Pudding Teaspoon -- Pudding Cup -- Honey Teaspoon -- Honey Cup -- Nectar Teaspoon -- Nectar Cup -- Nectar Straw -- Thin Teaspoon -- Thin Cup Prominent cricopharyngeal segment Thin Straw -- Puree -- Mechanical Soft -- Regular -- Multi-consistency -- Pill -- Cervical Esophageal Comment appearance of potential minimal backflow of liquid near UES region = suboptimal view - did not backflow into pharynx/larynx [name], MS [facility] SLP Acute Rehab Services Office [phone] Pager [phone] [name] 03/27/2020, 2:31 PM                Time Spent in minutes  30     Desiree Hane M.D on 04/10/2020 at 3:32 PM  To page go to [email or web address] - password [email or web address]

## 2020-04-10 NOTE — Anesthesia Preprocedure Evaluation (Deleted)
Anesthesia Evaluation  Patient identified by MRN, date of birth, ID band Patient awake    Reviewed: Allergy & Precautions, H&P , NPO status , Patient's Chart, lab work & pertinent test results  Airway        Dental no notable dental hx.    Pulmonary COPD,  COPD inhaler and oxygen dependent, Current Smoker and Patient abstained from smoking.,  Trach for supraglotic mass   Pulmonary exam normal        Cardiovascular Exercise Tolerance: Good hypertension, Pt. on medications      Neuro/Psych negative neurological ROS  negative psych ROS   GI/Hepatic Neg liver ROS, hiatal hernia,   Endo/Other  Morbid obesity  Renal/GU negative Renal ROS  negative genitourinary   Musculoskeletal  (+) Arthritis ,   Abdominal   Peds  Hematology negative hematology ROS (+)   Anesthesia Other Findings   Reproductive/Obstetrics negative OB ROS                            Anesthesia Physical Anesthesia Plan  ASA: IV  Anesthesia Plan: MAC   Post-op Pain Management:    Induction: Intravenous  PONV Risk Score and Plan: 1 and Propofol infusion and Treatment may vary due to age or medical condition  Airway Management Planned: Tracheostomy  Additional Equipment:   Intra-op Plan:   Post-operative Plan:   Informed Consent: I have reviewed the patients History and Physical, chart, labs and discussed the procedure including the risks, benefits and alternatives for the proposed anesthesia with the patient or authorized representative who has indicated his/her understanding and acceptance.     Dental advisory given  Plan Discussed with: CRNA  Anesthesia Plan Comments:        Anesthesia Quick Evaluation

## 2020-04-10 NOTE — Consult Note (Signed)
Referring Provider: [facility] Primary Care Physician:  [facility] Gastroenterologist: Althia Forts  Reason for Consultation: Lower abdominal pain, abnormal CT scan showing rectal wall thickening  HPI: Veronica Horton is a 59 y.o. female with past medical history of COPD and chronic hypoxic respiratory failure was admitted to the hospital on November 10 with shortness of breath.  Was initially diagnosed with COPD exacerbation.  Was subsequently diagnosed with laryngeal mass.  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  Also underwent PEG tube placement.  Currently receiving chemoradiation.  Underwent CT abdomen pelvis for evaluation of lower abdominal pain which showed significant rectal wall thickening with surrounding perirectal infiltrative changes.  Could be from proctitis could be malignancy.  GI was consulted for further evaluation.  Patient seen and examined at bedside.  Chart reviewed.  Limited history obtained because of patient's tracheostomy.  Patient's lower abdominal pain is improving.  She is denying any blood in the stool or black stool.  No previous colonoscopy.  No family history of colon cancer  Past Medical History:  Diagnosis Date  . Bronchitis   . Class 3 obesity 01/23/2020  . COPD (chronic obstructive pulmonary disease) ([place])   . Degenerative disc disease, lumbar   . Hiatal hernia   . Hypertension     Past Surgical History:  Procedure Laterality Date  . CHOLECYSTECTOMY    . degenerative bone disease    . DIRECT LARYNGOSCOPY N/A 03/13/2020   Procedure: DIRECT LARYNGOSCOPY WITH BIOPSY;  Surgeon: Jason Coop, DO;  Location: [facility];  Service: ENT;  Laterality: N/A;  . INCISIONAL HERNIA REPAIR N/A 01/15/2019   Procedure: [name] HERNIORRHAPHY WITH MESH;  Surgeon: [name], MD;  Location: [facility];  Service: General;  Laterality: N/A;  . IR GASTROSTOMY TUBE MOD SED  03/22/2020  . OMENTECTOMY N/A  01/15/2019   Procedure: OMENTECTOMY;  Surgeon: [name], MD;  Location: [facility];  Service: General;  Laterality: N/A;  . TRACHEOSTOMY TUBE PLACEMENT N/A 03/13/2020   Procedure: AWAKE TRACHEOSTOMY;  Surgeon: Jason Coop, DO;  Location: [facility];  Service: ENT;  Laterality: N/A;    Prior to Admission medications   Medication Sig Start Date End Date Taking? Authorizing Provider  albuterol (PROVENTIL) (2.5 MG/3ML) 0.083% nebulizer solution Take 2.5 mg by nebulization every 6 (six) hours as needed for wheezing or shortness of breath.   Yes [provider]  albuterol (VENTOLIN HFA) 108 (90 Base) MCG/ACT inhaler Inhale 1-2 puffs into the lungs every 6 (six) hours as needed for wheezing or shortness of breath.   Yes [provider]  feeding supplement, ENSURE ENLIVE, (ENSURE ENLIVE) LIQD Take 237 mLs by mouth 2 (two) times daily between meals. 01/25/20  Yes Shah, Pratik D, DO  folic acid (FOLVITE) 1 MG tablet Take 1 tablet (1 mg total) by mouth daily. 01/31/19  Yes [name], MD  furosemide (LASIX) 40 MG tablet Take 1 tablet (40 mg total) by mouth daily. 01/30/19 03/14/20 Yes [name], MD  gabapentin (NEURONTIN) 300 MG capsule Take 600 mg by mouth 3 (three) times daily.  12/30/18  Yes [provider]  lisinopril (ZESTRIL) 20 MG tablet Take 1 tablet (20 mg total) by mouth daily. 01/31/19  Yes [name], MD  budesonide-formoterol [facility]) 160-4.5 MCG/ACT inhaler Inhale 2 puffs into the lungs 2 (two) times daily. Patient not taking: Reported on 03/14/2020 01/30/19   [name], MD  cyclobenzaprine (FLEXERIL) 10 MG tablet Take 10 mg  by mouth daily as needed for muscle spasms. Patient not taking: Reported on 03/14/2020    [provider]  nicotine (NICODERM CQ - DOSED IN MG/24 HR) 7 mg/24hr patch Place 1 patch (7 mg total) onto the skin daily. Patient not taking: Reported on 03/14/2020 01/26/20   Heath Lark D, DO  saccharomyces boulardii  (FLORASTOR) 250 MG capsule Take 1 capsule (250 mg total) by mouth 2 (two) times daily. Patient not taking: Reported on 01/23/2020 01/30/19   [name], MD  vitamin B-12 (CYANOCOBALAMIN) 500 MCG tablet Take 1 tablet (500 mcg total) by mouth daily. Patient not taking: Reported on 01/23/2020 01/31/19   [name], MD    Scheduled Meds: . arformoterol  15 mcg Nebulization BID  . bisacodyl  10 mg Rectal Once  . budesonide (PULMICORT) nebulizer solution  0.5 mg Nebulization BID  . chlorhexidine  15 mL Mouth Rinse BID  . docusate  100 mg Per Tube BID  . enoxaparin (LOVENOX) injection  60 mg Subcutaneous Daily  . feeding supplement  237 mL Oral TID BM  . feeding supplement (OSMOLITE 1.5 CAL)  237 mL Per Tube TID BM  . gabapentin  600 mg Oral QHS  . insulin aspart  0-9 Units Subcutaneous Q4H  . ipratropium-albuterol  3 mL Nebulization BID  . ketotifen  1 drop Both Eyes BID  . lidocaine  1 patch Transdermal Q24H  . mouth rinse  15 mL Mouth Rinse q12n4p  . melatonin  5 mg Oral QHS  . metroNIDAZOLE  500 mg Per Tube Q8H  . multivitamin with minerals  1 tablet Per Tube Daily  . nicotine  21 mg Transdermal Daily  . pantoprazole sodium  40 mg Per Tube Daily  . polyethylene glycol  17 g Per Tube BID  . scopolamine  1 patch Transdermal Q72H   Continuous Infusions: . sodium chloride    . ciprofloxacin 400 mg (04/10/20 0605)   PRN Meds:.sodium chloride, acetaminophen (TYLENOL) oral liquid 160 mg/5 mL, bisacodyl, diclofenac Sodium, diphenhydrAMINE, glucagon, guaiFENesin-dextromethorphan, hydrOXYzine, ipratropium-albuterol, lidocaine (PF), magic mouthwash w/lidocaine, oxyCODONE, prochlorperazine **OR** prochlorperazine  Allergies as of 03/10/2020 - Review Complete 03/10/2020  Allergen Reaction Noted  . Codeine Hives 10/03/2010  . Penicillins Hives 10/03/2010    Family History  Problem Relation Age of Onset  . Hypertension Mother   . Sudden Cardiac Death Neg Hx     Social History    Socioeconomic History  . Marital status: Divorced    Spouse name: Not on file  . Number of children: Not on file  . Years of education: Not on file  . Highest education level: Not on file  Occupational History  . Not on file  Tobacco Use  . Smoking status: Current Every Day Smoker    Packs/day: 1.00  . Smokeless tobacco: Never Used  Substance and Sexual Activity  . Alcohol use: No  . Drug use: No  . Sexual activity: Yes    Birth control/protection: Post-menopausal  Other Topics Concern  . Not on file  Social History Narrative  . Not on file   Social Determinants of Health   Financial Resource Strain: Not on file  Food Insecurity: Not on file  Transportation Needs: Not on file  Physical Activity: Not on file  Stress: Not on file  Social Connections: Not on file  Intimate Partner Violence: Not on file    Review of Systems: All negative except as stated above in HPI.  Physical Exam: Vital signs: Vitals:   04/10/20  0351 04/10/20 0513  BP:  (!) 100/55  Pulse: 64 65  Resp: 18 17  Temp:  98.8 F (37.1 C)  SpO2: 92% 92%   Last BM Date: 04/09/20 Physical Exam Vitals reviewed.  Constitutional:      General: She is not in acute distress.    Appearance: She is well-developed.  HENT:     Head: Normocephalic and atraumatic.  Neck:     Comments: Tracheostomy in place Cardiovascular:     Rate and Rhythm: Normal rate and regular rhythm.     Heart sounds: No murmur heard.   Pulmonary:     Effort: Pulmonary effort is normal.     Comments: Coarse breath sounds noted.  Anterior exam only Chest:     Chest wall: No deformity.  Abdominal:     General: Bowel sounds are normal.     Palpations: Abdomen is soft. There is no mass.     Tenderness: There is no abdominal tenderness. There is no guarding or rebound.  Musculoskeletal:     Right lower leg: No edema.     Left lower leg: No edema.  Skin:    General: Skin is warm.  Neurological:     Mental Status: She is  alert and oriented to person, place, and time.  Psychiatric:        Mood and Affect: Mood normal.        Behavior: Behavior normal.     GI:  Lab Results: Recent Labs    04/08/20 0517 04/09/20 0830 04/10/20 0617  WBC 6.0 9.5 7.6  HGB 11.1* 11.1* 10.3*  HCT 34.7* 33.6* 32.1*  PLT 151 184 157   BMET Recent Labs    04/08/20 0517 04/09/20 0830 04/10/20 0617  NA 133* 135 134*  K 4.8 4.5 4.4  CL 98 99 99  CO2 25 27 28   GLUCOSE 86 89 101*  BUN 31* 28* 23*  CREATININE 1.18* 1.18* 1.21*  CALCIUM 8.3* 8.1* 8.0*   LFT Recent Labs    04/09/20 0830  PROT 5.6*  ALBUMIN 2.7*  AST 16  ALT 15  ALKPHOS 54  BILITOT 0.7   PT/INR No results for input(s): LABPROT, INR in the last 72 hours.   Studies/Results: CT ABDOMEN PELVIS WO CONTRAST  Result Date: 04/09/2020 CLINICAL DATA:  Intermittent lower quadrant abdominal pain, diminished bowel movements over past 3 days, fever, history COPD, hypertension, hiatal hernia EXAM: CT ABDOMEN AND PELVIS WITHOUT CONTRAST TECHNIQUE: Multidetector CT imaging of the abdomen and pelvis was performed following the standard protocol without IV contrast. Sagittal and coronal MPR images reconstructed from axial data set. COMPARISON:  03/15/2020 FINDINGS: Lower chest: Bibasilar consolidation, volume loss and pleural effusions Hepatobiliary: Gallbladder surgically absent. Liver normal appearance Pancreas: Atrophic, otherwise unremarkable Spleen: Normal appearance Adrenals/Urinary Tract: LEFT renal cysts unchanged. Adrenal glands, kidneys, ureters, and bladder otherwise normal appearance Stomach/Bowel: Scattered radiopacities within stool. Normal appendix. Gastrostomy tube in stomach. Mild diverticulosis of distal descending and proximal sigmoid colon without evidence of diverticulitis. Significant rectal wall thickening with surrounding perirectal infiltrative changes extending into presacral space favor proctitis though tumor not excluded. Prior ventral  hernia repair with recurrent hernia identified at the inferior margin of the repair containing a nonobstructed small bowel loop (see coronal images 81-82). Vascular/Lymphatic: Atherosclerotic calcifications aorta and iliac arteries without aneurysm. No adenopathy. Reproductive: Unremarkable uterus and adnexa Other: Infiltration of fat within small bowel mesentery consistent with fibrosing mesenteritis. No free air or free fluid. Musculoskeletal: Osseous demineralization. IMPRESSION: Distal colonic diverticulosis  without evidence of diverticulitis. Significant rectal wall thickening with surrounding perirectal infiltrative changes extending into presacral space favor proctitis though tumor not excluded; recommend correlation with proctoscopy. Prior ventral hernia repair with recurrent hernia at the inferior margin of the repair, containing a nonobstructed small bowel loop. Changes of fibrosing mesenteritis. Bibasilar consolidation, volume loss and pleural effusions. Aortic Atherosclerosis (ICD10-I70.0). Electronically Signed   By: Lavonia Dana M.D.   On: 04/09/2020 13:27   DG CHEST PORT 1 VIEW  Result Date: 04/08/2020 CLINICAL DATA:  Respiratory failure. EXAM: PORTABLE CHEST 1 VIEW COMPARISON:  April 03, 2020 FINDINGS: There is stable tracheostomy tube positioning. Mild, diffuse chronic appearing increased interstitial lung markings are seen. Mild to moderate severity atelectasis and/or infiltrate is seen within the right lung base. This is similar in severity when compared to the prior study. Marked severity left basilar atelectasis and/or infiltrate is seen and is increased in severity when compared to the prior exam. There is no evidence of a pleural effusion or pneumothorax. There is mild to moderate severity enlargement of the cardiac silhouette. Degenerative changes are seen throughout the thoracic spine. IMPRESSION: 1. Bibasilar atelectasis and/or infiltrate, left greater than right. Electronically Signed    By: Virgina Norfolk M.D.   On: 04/08/2020 16:45    Impression/Plan: -Abnormal CT scan concerning for rectal wall thickening.  Could be proctitis versus tumor. -Supraglottic laryngeal cancer.  Status post tracheostomy.  Currently receiving chemoradiation. -COPD   Recommendations ------------------------ -Plan for flexible sigmoidoscopy tomorrow. -Okay to have soft diet today.  Keep n.p.o. past midnight.   fleets enema, 1 tonight and 1 tomorrow morning.  Risks (bleeding, infection, bowel perforation that could require surgery, sedation-related changes in cardiopulmonary systems), benefits (identification and possible treatment of source of symptoms, exclusion of certain causes of symptoms), and alternatives (watchful waiting, radiographic imaging studies, empiric medical treatment)  were explained to patient in detail and patient wishes to proceed.   LOS: 31 days   Otis Brace  MD, FACP 04/10/2020, 8:41 AM  Contact #  [phone]

## 2020-04-10 NOTE — Progress Notes (Signed)
PT Cancellation Note  Patient Details Name: Veronica Horton MRN: [id number] DOB: 01-18-1961   Cancelled Treatment:    Reason Eval/Treat Not Completed: Medical issues which prohibited therapy. Pt declines due to her stomach bothering her and feeling poorly. She is getting colonoscopy tomorrow. Will check back at the beginning of next week.   Galen Manila 04/10/2020, 1:33 PM

## 2020-04-11 ENCOUNTER — Inpatient Hospital Stay (HOSPITAL_COMMUNITY): Payer: Medicaid Other

## 2020-04-11 ENCOUNTER — Encounter (HOSPITAL_COMMUNITY)
Admission: EM | Disposition: A | Discharge status: Home / Self Care | Payer: Self-pay | Source: Home / Self Care | Attending: Internal Medicine

## 2020-04-11 DIAGNOSIS — R9389 Abnormal findings on diagnostic imaging of other specified body structures: Secondary | ICD-10-CM

## 2020-04-11 LAB — BASIC METABOLIC PANEL
Anion gap: 10 (ref 5–15)
BUN: 21 mg/dL — ABNORMAL HIGH (ref 6–20)
CO2: 26 mmol/L (ref 22–32)
Calcium: 8.3 mg/dL — ABNORMAL LOW (ref 8.9–10.3)
Chloride: 99 mmol/L (ref 98–111)
Creatinine, Ser: 1.14 mg/dL — ABNORMAL HIGH (ref 0.44–1.00)
GFR, Estimated: 55 mL/min — ABNORMAL LOW (ref >60)
Glucose, Bld: 91 mg/dL (ref 70–99)
Potassium: 4.2 mmol/L (ref 3.5–5.1)
Sodium: 135 mmol/L (ref 135–145)

## 2020-04-11 LAB — CBC
HCT: 32.9 % — ABNORMAL LOW (ref 36.0–46.0)
Hemoglobin: 10.7 g/dL — ABNORMAL LOW (ref 12.0–15.0)
MCH: 31.7 pg (ref 26.0–34.0)
MCHC: 32.5 g/dL (ref 30.0–36.0)
MCV: 97.3 fL (ref 80.0–100.0)
Platelets: 166 10*3/uL (ref 150–400)
RBC: 3.38 MIL/uL — ABNORMAL LOW (ref 3.87–5.11)
RDW: 14.3 % (ref 11.5–15.5)
WBC: 5.6 10*3/uL (ref 4.0–10.5)
nRBC: 0 % (ref 0.0–0.2)

## 2020-04-11 LAB — GLUCOSE, CAPILLARY
Glucose-Capillary: 86 mg/dL (ref 70–99)
Glucose-Capillary: 93 mg/dL (ref 70–99)
Glucose-Capillary: 85 mg/dL (ref 70–99)
Glucose-Capillary: 101 mg/dL — ABNORMAL HIGH (ref 70–99)
Glucose-Capillary: 137 mg/dL — ABNORMAL HIGH (ref 70–99)

## 2020-04-11 SURGERY — CANCELLED PROCEDURE
Anesthesia: Monitor Anesthesia Care

## 2020-04-11 MED ORDER — FLEET ENEMA 7-19 GM/118ML RE ENEM
1.0000 | ENEMA | Freq: Once | RECTAL | Status: AC
  Administered 2020-04-12: 06:00:00 1 via RECTAL
  Filled 2020-04-11: qty 1

## 2020-04-11 MED ORDER — IPRATROPIUM-ALBUTEROL 0.5-2.5 (3) MG/3ML IN SOLN
3.0000 mL | Freq: Four times a day (QID) | RESPIRATORY_TRACT | Status: DC
  Administered 2020-04-11 (×2): 3 mL via RESPIRATORY_TRACT
  Filled 2020-04-11 (×2): qty 3

## 2020-04-11 NOTE — Progress Notes (Signed)
[facility]  PROGRESS NOTE  AUDEN WETTSTEIN IOX:[id number] DOB: 02-02-1961 DOA: 03/10/2020 PCP: [facility] date - 03/10/2020   Admitting Physician Albertine Patricia, MD  Outpatient Primary MD for the patient is [facility] [place] is a 59 y.o. year old female with medical history significant for COPD, chronic hypoxic respiratory failure on 5 L O2, hiatal hernia, morbid obesity who presented on 03/10/2020 with shortness of breath thus far have COPD exacerbation.  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  Has been at [facility] since 11/17 radiation therapy, also had PEG tube placed on 11/22.  Currently receiving chemoradiation.  Planned flex sig and anoscopy on 12/12 cancer due to respiratory issues.  Repeat chest x-ray shows right middle lobe atelectasis and stable pleural effusions/atelectasis.    Subjective  Still having productive cough, lower abdominal pain  A & P   Lower quadrant abdominal pain, CT abdomen showing rectal wall thickening and perirectal infiltrate changes concerning for proctitis. Malignancy as well as radiation associated proctitis also on differential. Still having abdominal pain despite optimization of bowel regimen and having bowel movements, as well as fever and increase in WBC though no leukocytosis currently. - IV cipro and flagy (listed allergy to penicillins). IV Given poor po intake related to abdominal pain -monitor blood cultures -GI plans for endoscopic evaluation on 12/13 (held on 12/12 due to some respiratory issues this a.m. that are now resolved), continue liquid diet, n.p.o. at midnight -bowel regimen continue Colace twice daily, add MiraLAX twice daily -Continue  PPI  Fever in the setting of likely proctitis.  T-max 100.3 overnight,  has no leukocytosis, blood pressures stable at 115/65 -Continue IV cipro and flagyl given concern for proctitis -Monitor blood cultures -Closely monitor blood pressure, on telemetry  Supraglottic cell laryngeal cancer, status post tracheostomy and PEG placement.  Patient declined total laryngectomy -Responding well to chemochterapy, discussed with Dr.Iruku on 12/9 -Receiving radiation  Acute on chronic hypoxic/hypercarbic respiratory failure, stable.  Likely multifactorial etiology including COPD further exacerbated by laryngeal squamous cell carcinoma as well aspiration pneumonia.  No current evidence of wheezing, feel COPD stable, discussed with oncology status of her larger masses decreased significantly, would expect ability to decrease O2 requirements, chest x-ray on 12/12 shows bibasilar atelectasis and right middle lobe atelectasis -On size 6 uncuffed Shiley placed by PCCM on 12/2 -Increase scheduled duo nebs to every 6 -Still requiring 10 L via trach, previous home regimen 5 L, will need to wean to be able to be safe from a disposition standpoint to go home, if unable to wean will assess with chest x-ray to ensure no contributing factors from a lung standpoint, goal SPO2 greater than 88% -Encourage incentive spirometry use -If cough worsens or continues to persist with fever will broaden out coverage with antibiotics for pneumonia -Continue Pulmicort and duo nebs inhalers  AKI, improving.  Baseline creatinine 0.9-1.  Currently 1.14 suspect likely prerenal given diminished appetite related to ongoing/intermittent abdominal pain. -Avoid nephrotoxins -Monitor output -Monitor BMP  COPD, no wheezing.  Increased rhonchi, chest x-ray shows atelectasis -Continue inhaler regimen -increase scheduled DuoNebs given increased rhonchi and productive cough  Hypoglycemia episodes.  Closely monitor regimen while on tube feeds -Continue close monitor CBG on insulin regimen  GERD -continue  PPI     Family Communication  :  None at bedside, will update family [name]  Code Status : Full  Disposition Plan  :  Patient is from home. Anticipated d/c date: 2 to 3 days. Barriers to d/c or necessity for inpatient status:  IV cipro and flagyl per tube for proctitis, GI consulted plan for flex sig on 12/13, still requiring fairly high amounts of oxygen currently at 10 L trach support, case management assisting with disposition patient will likely need home health therapy at minimum Consults  : Oncology and GI  Procedures  :    DVT Prophylaxis  :  Lovenox  MDM: The below labs and imaging reports were reviewed and summarized above.  Medication management as above.  Lab Results  Component Value Date   PLT 166 04/11/2020    Diet :  Diet Order            Diet NPO time specified  Diet effective midnight           DIET SOFT Room service appropriate? Yes; Fluid consistency: Thin  Diet effective now                  Inpatient Medications Scheduled Meds: . arformoterol  15 mcg Nebulization BID  . bisacodyl  10 mg Rectal Once  . budesonide (PULMICORT) nebulizer solution  0.5 mg Nebulization BID  . chlorhexidine  15 mL Mouth Rinse BID  . docusate  100 mg Per Tube BID  . enoxaparin (LOVENOX) injection  60 mg Subcutaneous Daily  . feeding supplement  237 mL Oral TID BM  . feeding supplement (OSMOLITE 1.5 CAL)  237 mL Per Tube TID BM  . gabapentin  600 mg Oral QHS  . insulin aspart  0-9 Units Subcutaneous Q4H  . ipratropium-albuterol  3 mL Nebulization Q6H  . ketotifen  1 drop Both Eyes BID  . lidocaine  1 patch Transdermal Q24H  . mouth rinse  15 mL Mouth Rinse q12n4p  . melatonin  5 mg Oral QHS  . metroNIDAZOLE  500 mg Per Tube Q8H  . multivitamin with minerals  1 tablet Per Tube Daily  . nicotine  21 mg Transdermal Daily  . pantoprazole sodium  40 mg Per Tube Daily  . polyethylene glycol  17 g Per Tube BID  . scopolamine  1 patch Transdermal Q72H  . [START ON  04/12/2020] sodium phosphate  1 enema Rectal Once   Continuous Infusions: . sodium chloride    . sodium chloride 20 mL/hr at 04/11/20 0006  . ciprofloxacin 400 mg (04/11/20 0634)   PRN Meds:.sodium chloride, acetaminophen (TYLENOL) oral liquid 160 mg/5 mL, bisacodyl, diclofenac Sodium, diphenhydrAMINE, glucagon, guaiFENesin-dextromethorphan, hydrOXYzine, ipratropium-albuterol, lidocaine (PF), magic mouthwash w/lidocaine, oxyCODONE, prochlorperazine **OR** prochlorperazine  Antibiotics  :   Anti-infectives (From admission, onward)   Start     Dose/Rate Route Frequency Ordered Stop   04/09/20 1800  ciprofloxacin (CIPRO) IVPB 400 mg        400 mg 200 mL/hr over 60 Minutes Intravenous Every 12 hours 04/09/20 1705     04/09/20 1800  metroNIDAZOLE (FLAGYL) tablet 500 mg        500 mg Per Tube Every 8 hours 04/09/20 1705     03/22/20 1700  vancomycin (VANCOCIN) IVPB 1000 mg/200 mL premix  Status:  Discontinued        1,000 mg 200 mL/hr over 60 Minutes Intravenous  Once 03/22/20 1605 03/23/20 0236   03/22/20 1604  vancomycin (VANCOCIN) 1-5 GM/200ML-% IVPB       Note to  Pharmacy: [name]   : cabinet override      03/22/20 1604 03/22/20 1615   03/14/20 1100  levofloxacin (LEVAQUIN) IVPB 750 mg        750 mg 100 mL/hr over 90 Minutes Intravenous Daily 03/14/20 0955 03/19/20 1146   03/10/20 1730  doxycycline (VIBRAMYCIN) 100 mg in sodium chloride 0.9 % 250 mL IVPB  Status:  Discontinued        100 mg 125 mL/hr over 120 Minutes Intravenous Every 12 hours 03/10/20 1634 03/14/20 0955   03/10/20 1600  azithromycin (ZITHROMAX) 500 mg in sodium chloride 0.9 % 250 mL IVPB  Status:  Discontinued        500 mg 250 mL/hr over 60 Minutes Intravenous Every 24 hours 03/10/20 1529 03/10/20 1538   03/10/20 1500  clindamycin (CLEOCIN) IVPB 600 mg  Status:  Discontinued        600 mg 100 mL/hr over 30 Minutes Intravenous  Once 03/10/20 1458 03/10/20 1521       Objective   Vitals:   04/11/20 1112  04/11/20 1145 04/11/20 1341 04/11/20 1543  BP:  115/65    Pulse: 60 (!) 59  65  Resp:  16    Temp:  98.9 F (37.2 C)    TempSrc:  Oral    SpO2: 92% 93% 93% 93%  Weight:      Height:        SpO2: 93 % O2 Flow Rate (L/min): 10 L/min FiO2 (%): 60 %  Wt Readings from Last 3 Encounters:  04/11/20 118.6 kg  01/24/20 111.8 kg  01/30/19 (!) 136.3 kg     Intake/Output Summary (Last 24 hours) at 04/11/2020 1639 Last data filed at 04/11/2020 0200 Gross per 24 hour  Intake 674.75 ml  Output 1050 ml  Net -375.25 ml    Physical Exam:  Awake Alert, Oriented X 3, flat affect No new F.N deficits,  .AT, Normal respiratory effort on trach collar, no wheezing, rhonchi present RRR,No Gallops,Rubs or new Murmurs,  Abd Soft, tender in lower quadrant with deep palpation,no tenderness around PEG tube No Cyanosis, No new Rash or bruise    I have personally reviewed the following:   Data Reviewed:  CBC Recent Labs  Lab 04/05/20 0517 04/07/20 0800 04/08/20 0517 04/09/20 0830 04/10/20 0617 04/11/20 0530  WBC 5.8 7.4 6.0 9.5 7.6 5.6  HGB 12.0 10.9* 11.1* 11.1* 10.3* 10.7*  HCT 37.5 33.9* 34.7* 33.6* 32.1* 32.9*  PLT 171 177 151 184 157 166  MCV 98.7 98.3 96.7 96.3 97.6 97.3  MCH 31.6 31.6 30.9 31.8 31.3 31.7  MCHC 32.0 32.2 32.0 33.0 32.1 32.5  RDW 14.3 14.3 14.2 14.3 14.4 14.3  LYMPHSABS 1.0 0.9  --   --   --   --   MONOABS 0.6 0.8  --   --   --   --   EOSABS 0.1 0.0  --   --   --   --   BASOSABS 0.0 0.0  --   --   --   --     Chemistries  Recent Labs  Lab 04/05/20 0517 04/07/20 0800 04/08/20 0517 04/09/20 0830 04/10/20 0617 04/11/20 0530  NA 137 134* 133* 135 134* 135  K 4.4 4.4 4.8 4.5 4.4 4.2  CL 99 99 98 99 99 99  CO2 29 27 25 27 28 26   GLUCOSE 89 126* 86 89 101* 91  BUN 14 34* 31* 28* 23* 21*  CREATININE 1.00 1.36* 1.18* 1.18*  1.21* 1.14*  CALCIUM 8.5* 8.3* 8.3* 8.1* 8.0* 8.3*  MG 2.1 2.3  --   --   --   --   AST  --   --  14* 16  --   --   ALT  --    --  15 15  --   --   ALKPHOS  --   --  51 54  --   --   BILITOT  --   --  0.5 0.7  --   --    ------------------------------------------------------------------------------------------------------------------ No results for input(s): CHOL, HDL, LDLCALC, TRIG, CHOLHDL, LDLDIRECT in the last 72 hours.  Lab Results  Component Value Date   HGBA1C 5.4 03/15/2020   ------------------------------------------------------------------------------------------------------------------ No results for input(s): TSH, T4TOTAL, T3FREE, THYROIDAB in the last 72 hours.  Invalid input(s): FREET3 ------------------------------------------------------------------------------------------------------------------ No results for input(s): VITAMINB12, FOLATE, FERRITIN, TIBC, IRON, RETICCTPCT in the last 72 hours.  Coagulation profile No results for input(s): INR, PROTIME in the last 168 hours.  No results for input(s): DDIMER in the last 72 hours.  Cardiac Enzymes No results for input(s): CKMB, TROPONINI, MYOGLOBIN in the last 168 hours.  Invalid input(s): CK ------------------------------------------------------------------------------------------------------------------    Component Value Date/Time   BNP 64.6 03/29/2020 0515    Micro Results Recent Results (from the past 240 hour(s))  Culture, respiratory (non-expectorated)     Status: None   Collection Time: 04/02/20  3:36 PM   Specimen: Tracheal Aspirate; Respiratory  Result Value Ref Range Status   Specimen Description   Final    TRACHEAL ASPIRATE Performed at [facility] [address] [place], [place] [address]    Special Requests   Final    NONE Performed at [facility], [place] [address], [place], [place] [address]    Gram Stain   Final    RARE WBC PRESENT, PREDOMINANTLY PMN RARE GRAM POSITIVE COCCI RARE GRAM NEGATIVE RODS Performed at [facility], [place] [address] [place], [place]  [address]    Culture MODERATE SERRATIA MARCESCENS  Final   Report Status 04/05/2020 FINAL  Final   Organism ID, Bacteria SERRATIA MARCESCENS  Final      Susceptibility   Serratia marcescens - MIC*    CEFAZOLIN >=64 RESISTANT Resistant     CEFEPIME <=0.12 SENSITIVE Sensitive     CEFTAZIDIME <=1 SENSITIVE Sensitive     CEFTRIAXONE <=0.25 SENSITIVE Sensitive     CIPROFLOXACIN <=0.25 SENSITIVE Sensitive     GENTAMICIN <=1 SENSITIVE Sensitive     TRIMETH/SULFA <=20 SENSITIVE Sensitive     * MODERATE SERRATIA MARCESCENS  Culture, blood (routine x 2)     Status: None (Preliminary result)   Collection Time: 04/09/20  8:23 AM   Specimen: BLOOD  Result Value Ref Range Status   Specimen Description   Final    BLOOD LEFT ANTECUBITAL Performed at [facility] [address] [place], [place] [address]    Special Requests   Final    BOTTLES DRAWN AEROBIC AND ANAEROBIC Blood Culture adequate volume Performed at [facility] [address], [place], [place] [address]    Culture   Final    NO GROWTH 2 DAYS Performed at [facility] [address], [place], [place] [address]    Report Status PENDING  Incomplete  Culture, blood (routine x 2)     Status: None (Preliminary result)   Collection Time: 04/09/20  8:30 AM   Specimen: BLOOD  Result Value  Ref Range Status   Specimen Description   Final    BLOOD LEFT HAND Performed at [facility] [address] [place], [place] [address]    Special Requests   Final    BOTTLES DRAWN AEROBIC AND ANAEROBIC Blood Culture adequate volume Performed at [facility] [address], [place], [place] [address]    Culture   Final    NO GROWTH 2 DAYS Performed at [facility] [address] [place], [place] [address]    Report Status PENDING  Incomplete  Culture, respiratory (non-expectorated)     Status: None (Preliminary result)   Collection Time: 04/09/20  3:26  PM   Specimen: Tracheal Aspirate; Respiratory  Result Value Ref Range Status   Specimen Description   Final    TRACHEAL ASPIRATE Performed at [facility] [address] [place], [place] [address]    Special Requests   Final    NONE Performed at [facility], [place] [address] [place], [place] [address]    Gram Stain   Final    NO WBC SEEN RARE GRAM POSITIVE COCCI RARE GRAM NEGATIVE RODS    Culture   Final    MODERATE SERRATIA MARCESCENS SUSCEPTIBILITIES TO FOLLOW Performed at [facility], [place] [address] [place], [place] [address]    Report Status PENDING  Incomplete    Radiology Reports CT ABDOMEN PELVIS WO CONTRAST  Result Date: 04/09/2020 CLINICAL DATA:  Intermittent lower quadrant abdominal pain, diminished bowel movements over past 3 days, fever, history COPD, hypertension, hiatal hernia EXAM: CT ABDOMEN AND PELVIS WITHOUT CONTRAST TECHNIQUE: Multidetector CT imaging of the abdomen and pelvis was performed following the standard protocol without IV contrast. Sagittal and coronal MPR images reconstructed from axial data set. COMPARISON:  03/15/2020 FINDINGS: Lower chest: Bibasilar consolidation, volume loss and pleural effusions Hepatobiliary: Gallbladder surgically absent. Liver normal appearance Pancreas: Atrophic, otherwise unremarkable Spleen: Normal appearance Adrenals/Urinary Tract: LEFT renal cysts unchanged. Adrenal glands, kidneys, ureters, and bladder otherwise normal appearance Stomach/Bowel: Scattered radiopacities within stool. Normal appendix. Gastrostomy tube in stomach. Mild diverticulosis of distal descending and proximal sigmoid colon without evidence of diverticulitis. Significant rectal wall thickening with surrounding perirectal infiltrative changes extending into presacral space favor proctitis though tumor not excluded. Prior ventral hernia repair with recurrent hernia identified at the inferior margin of the repair  containing a nonobstructed small bowel loop (see coronal images 81-82). Vascular/Lymphatic: Atherosclerotic calcifications aorta and iliac arteries without aneurysm. No adenopathy. Reproductive: Unremarkable uterus and adnexa Other: Infiltration of fat within small bowel mesentery consistent with fibrosing mesenteritis. No free air or free fluid. Musculoskeletal: Osseous demineralization. IMPRESSION: Distal colonic diverticulosis without evidence of diverticulitis. Significant rectal wall thickening with surrounding perirectal infiltrative changes extending into presacral space favor proctitis though tumor not excluded; recommend correlation with proctoscopy. Prior ventral hernia repair with recurrent hernia at the inferior margin of the repair, containing a nonobstructed small bowel loop. Changes of fibrosing mesenteritis. Bibasilar consolidation, volume loss and pleural effusions. Aortic Atherosclerosis (ICD10-I70.0). Electronically Signed   By: Lavonia Dana M.D.   On: 04/09/2020 13:27   IR GASTROSTOMY TUBE MOD SED  Result Date: 03/22/2020 INDICATION: Head and neck malignancy, dysphagia EXAM: FLUOROSCOPIC 16 FRENCH BALLOON RETENTION GASTROSTOMY MEDICATIONS: 1 G VANCOMYCIN; Antibiotics were administered within 1 hour of the procedure. GLUCAGON 0.5 MG IV ANESTHESIA/SEDATION: Versed 0 mg IV; Fentanyl 50 mcg IV Moderate Sedation Time:  12 MINUTES The patient was continuously monitored during the procedure  by the interventional radiology nurse under my direct supervision. CONTRAST:  10 CC-administered into the gastric lumen. FLUOROSCOPY TIME:  Fluoroscopy Time: 1 minutes 36 seconds (53 mGy). COMPLICATIONS: None immediate. PROCEDURE: Informed written consent was obtained from the patient after a thorough discussion of the procedural risks, benefits and alternatives. All questions were addressed. Maximal Sterile Barrier Technique was utilized including caps, mask, sterile gowns, sterile gloves, sterile drape, hand  hygiene and skin antiseptic. A timeout was performed prior to the initiation of the procedure. Previous imaging reviewed. Patient has an existing feeding tube within the stomach. This was utilized to insufflate the stomach with air. The stomach was localized beneath the left subcostal margin in the left abdomen with biplane fluoroscopy. Overlying skin marked. Under sterile conditions and local anesthesia, introducer needle was advanced into the stomach. Contrast injection confirms percutaneous needle access within the stomach. Single T tack deployed for gastropexy. Amplatz guidewire inserted. Tract dilatation performed to insert the 16 French balloon retention gastrostomy. Balloon tip inflated with 7 cc mixture of saline and 1 cc contrast. This was retracted against the anterior gastric wall. Contrast injection confirms position in the stomach. Images obtained for documentation. T tacks secured to the skin site. A sterile dressing applied. No immediate complication. Patient tolerated the procedure well. IMPRESSION: Successful fluoroscopic 37 French balloon retention gastrostomy Electronically Signed   By: Jerilynn Mages.  Shick M.D.   On: 03/22/2020 16:53   CT CHEST ABDOMEN PELVIS W CONTRAST  Result Date: 03/15/2020 CLINICAL DATA:  New diagnosis of head neck mass EXAM: CT CHEST, ABDOMEN, AND PELVIS WITH CONTRAST TECHNIQUE: Multidetector CT imaging of the chest, abdomen and pelvis was performed following the standard protocol during bolus administration of intravenous contrast. CONTRAST:  199mL OMNIPAQUE IOHEXOL 300 MG/ML  SOLN COMPARISON:  Prior swallowing evaluation and CT of the neck. FINDINGS: CT CHEST FINDINGS Cardiovascular: Calcified coronary artery disease. Aorta is normal caliber. 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 Central pulmonary vasculature is mildly engorged. Approximately 3 cm greatest caliber unchanged from previous exam. Unremarkable on limited venous phase  assessment. Mediastinum/Nodes: Supraglottic mass partially imaged on the first image of the data set. Interval placement of tracheostomy tube with resultant pneumomediastinum in the anterior mediastinum in upper mediastinum. No adenopathy within the mediastinum or axilla. No hilar adenopathy. Rounded partial necrotic lymph node in the LEFT neck partially visualized, corresponding to level IIIb lymph node seen on the neck CT. Lungs/Pleura: Basilar consolidative changes bilaterally with similar pattern of consolidation though decreased volume loss when compared to the study of September of 2020. Airways are patent. Musculoskeletal: See below for full musculoskeletal detail. CT ABDOMEN PELVIS FINDINGS Hepatobiliary: Liver without focal lesion. Post cholecystectomy. No biliary duct dilation. Pancreas: Mild atrophy of the pancreas without focal lesion, ductal dilation or inflammation. Spleen: Spleen normal in size and contour. Adrenals/Urinary Tract: Adrenal glands are normal. Symmetric renal enhancement. No hydronephrosis. LEFT renal cysts, largest arising from the lower pole measuring 2.8 x 2.9 cm. Urinary bladder under distended limiting assessment. Stomach/Bowel: Gastrointestinal tract with signs of colonic diverticulosis. No acute bowel process. Rectus diastasis. Postoperative changes in the midline of the abdomen related to prior ventral hernia repair. Mild bulging at the site of hernia repair, no herniation beyond the inserted mesh near the umbilicus. Vascular/Lymphatic: Calcified atheromatous plaque in the abdominal aorta. No aneurysmal dilation. There is no gastrohepatic or hepatoduodenal ligament lymphadenopathy. No retroperitoneal or mesenteric lymphadenopathy. No pelvic sidewall lymphadenopathy. Reproductive: No adnexal mass. Reproductive structures are unremarkable. Other: Post  abdominal wall reconstruction with rectus diastasis, no frank hernia. Musculoskeletal: Spinal degenerative changes. Degenerative  changes in glenohumeral joints and hips. Ununited fractures along posterior LEFT chest involving ribs 10 through 12. These appear subacute or chronic. These did not appear to be present on previous imaging from September of 2020. IMPRESSION: 1. Supraglottic mass partially imaged on the first image of the data set. No signs of metastatic disease to the chest, abdomen or pelvis. 2. Rounded partially necrotic lymph node in the LEFT neck partially visualized, corresponding to level IIIb lymph node seen on the neck CT. 3. Basilar consolidative changes bilaterally with similar pattern of consolidation though decreased volume loss when compared to the study of September of 2020. Findings may be related to aspiration. No definite lesions seen in the chest though these areas could obscure underlying lesions. 4. Postoperative changes with small amount of pneumomediastinum presumably related to recent tracheostomy tube insertion. 5. Ununited fractures along posterior LEFT chest involving ribs 10 through 12. These appear subacute or chronic. These did not appear to be present on previous imaging from September of 2020. 6. Post abdominal wall reconstruction with rectus diastasis, no frank hernia. 7. Aortic atherosclerosis. Aortic Atherosclerosis (ICD10-I70.0). Electronically Signed   By: Zetta Bills M.D.   On: 03/15/2020 10:53   DG CHEST PORT 1 VIEW  Result Date: 04/11/2020 CLINICAL DATA:  Hypoxia. EXAM: PORTABLE CHEST 1 VIEW COMPARISON:  April 08, 2020. FINDINGS: Stable cardiomediastinal silhouette. Tracheostomy tube is unchanged. No pneumothorax is noted. Stable left pleural effusion is noted with associated left basilar atelectasis or infiltrate. Right midlung subsegmental atelectasis is noted with probable small right pleural effusion. Bony thorax is unremarkable. IMPRESSION: Stable left pleural effusion with associated left basilar atelectasis or infiltrate. Right midlung subsegmental atelectasis is noted with  probable small right pleural effusion. Electronically Signed   By: Marijo Conception M.D.   On: 04/11/2020 13:20   DG CHEST PORT 1 VIEW  Result Date: 04/08/2020 CLINICAL DATA:  Respiratory failure. EXAM: PORTABLE CHEST 1 VIEW COMPARISON:  April 03, 2020 FINDINGS: There is stable tracheostomy tube positioning. Mild, diffuse chronic appearing increased interstitial lung markings are seen. Mild to moderate severity atelectasis and/or infiltrate is seen within the right lung base. This is similar in severity when compared to the prior study. Marked severity left basilar atelectasis and/or infiltrate is seen and is increased in severity when compared to the prior exam. There is no evidence of a pleural effusion or pneumothorax. There is mild to moderate severity enlargement of the cardiac silhouette. Degenerative changes are seen throughout the thoracic spine. IMPRESSION: 1. Bibasilar atelectasis and/or infiltrate, left greater than right. Electronically Signed   By: Virgina Norfolk M.D.   On: 04/08/2020 16:45   DG CHEST PORT 1 VIEW  Result Date: 04/03/2020 CLINICAL DATA:  Increasing shortness of breath EXAM: PORTABLE CHEST 1 VIEW COMPARISON:  03/31/2020 FINDINGS: Tracheostomy tube is noted in satisfactory position. Cardiac shadow is enlarged but stable. Some persistent bibasilar opacities are noted although mildly improved when compared with the prior exam. No new focal abnormality is noted. IMPRESSION: Improved aeration as described. Electronically Signed   By: Inez Catalina M.D.   On: 04/03/2020 11:59   DG CHEST PORT 1 VIEW  Result Date: 03/31/2020 CLINICAL DATA:  Pleural effusion EXAM: PORTABLE CHEST 1 VIEW COMPARISON:  03/29/2020 FINDINGS: Tracheostomy unchanged. Lung volumes are small, however, pulmonary insufflation remains stable since prior examination. Bilateral pleural effusions are present small and decreased in size since prior examination. Ovoid opacity  within the right mid lung zone likely  represents fluid within the fissure. There is bibasilar atelectasis. Cardiac size is within normal limits. No acute bone abnormality. IMPRESSION: Slight interval decrease in small bilateral pleural effusions with associated bibasilar atelectasis. Pulmonary hypoinflation. Electronically Signed   By: Fidela Salisbury MD   On: 03/31/2020 05:41   DG CHEST PORT 1 VIEW  Result Date: 03/29/2020 CLINICAL DATA:  Dyspnea EXAM: PORTABLE CHEST 1 VIEW COMPARISON:  03/25/2020 FINDINGS: Tracheostomy unchanged. Pulmonary insufflation is symmetric though has decreased slightly since prior examination. Small bilateral pleural effusions have developed with associated bibasilar atelectasis. No pneumothorax. Cardiac size within normal limits. Pulmonary vascularity is normal. No acute bone abnormality. IMPRESSION: Interval development of small bilateral pleural effusions with bibasilar compressive atelectasis. Electronically Signed   By: Fidela Salisbury MD   On: 03/29/2020 06:00   DG CHEST PORT 1 VIEW  Result Date: 03/25/2020 CLINICAL DATA:  Hypoxia, shortness of breath, altered mental status history COPD, hypertension, history laryngeal carcinoma, tracheostomy, undergoing radiation therapy EXAM: PORTABLE CHEST 1 VIEW COMPARISON:  Portable exam 9379 hours compared to 03/23/2020 FINDINGS: Tracheostomy tube projects over tracheal air column. Normal heart size, mediastinal contours, and pulmonary vascularity. Improving opacity at LEFT lung base which could represent atelectasis or consolidation. Subsegmental atelectasis RIGHT middle lobe slightly improved. Tiny LEFT pleural effusion. Remaining lungs clear. No pneumothorax. Bones demineralized. IMPRESSION: Persistent subsegmental atelectasis at RIGHT base with improving atelectasis versus consolidation and tiny pleural effusion at LEFT base. Electronically Signed   By: Lavonia Dana M.D.   On: 03/25/2020 10:33   DG CHEST PORT 1 VIEW  Result Date: 03/23/2020 CLINICAL DATA:  Hypoxia.  EXAM: PORTABLE CHEST 1 VIEW COMPARISON:  Left 19 2021. FINDINGS: Interim removal of feeding tube. Tracheostomy tube in stable position. Cardiomegaly. Persistent bibasilar atelectasis/infiltrates and small bilateral pleural effusions. Density noted over the right mid chest may represent atelectasis and or pleural fluid pseudotumor, no interim change. IMPRESSION: 1. Interim removal of feeding tube. Tracheostomy tube in stable position. 2. Stable cardiomegaly. 3. Persistent bibasilar atelectasis/infiltrates and small bilateral pleural effusions. Density of the right mid chest may represent atelectasis and or pleural fluid pseudotumor, no interim change. Electronically Signed   By: Marcello Moores  Register   On: 03/23/2020 06:39   DG CHEST PORT 1 VIEW  Result Date: 03/19/2020 CLINICAL DATA:  Pneumonia. EXAM: PORTABLE CHEST 1 VIEW COMPARISON:  03/18/2020. FINDINGS: Tracheostomy tube, feeding tube in stable position. Stable cardiomegaly. Improved pulmonary venous congestion. Lung volumes. Persistent left base atelectasis/infiltrate. Persistent right base subsegmental atelectasis. Elliptical density noted over the right mid chest may represent atelectasis and or fissural fluid. Pleural effusions cannot be excluded. No pneumothorax. IMPRESSION: 1. Lines and tubes in stable position. 2. Stable cardiomegaly. Improved pulmonary venous congestion. 3. Persistent left base atelectasis/infiltrate. Persistent right base subsegmental atelectasis. Elliptical density noted over the right mid chest may represent atelectasis and or fissural fluid. Small pleural effusions cannot be excluded. Electronically Signed   By: Marcello Moores  Register   On: 03/19/2020 06:03   DG Chest Port 1 View  Result Date: 03/18/2020 CLINICAL DATA:  Respiratory failure. EXAM: PORTABLE CHEST 1 VIEW COMPARISON:  03/17/2020. FINDINGS: Tracheostomy tube and feeding tube in stable position. Cardiomegaly. Mild pulmonary venous congestion. Mild bilateral interstitial  prominence. Mild component of CHF cannot be excluded. Persistent left lower lobe atelectasis/infiltrate. New onset right mid lung prominent atelectatic changes. No pneumothorax. IMPRESSION: 1. Tracheostomy tube and feeding tube in stable position. 2. Cardiomegaly with mild pulmonary venous congestion and mild bilateral  interstitial prominence. Mild component of CHF cannot be excluded. 3. Persistent left lower lobe atelectasis/infiltrate. New onset of right mid lung prominent atelectatic changes. Electronically Signed   By: Marcello Moores  Register   On: 03/18/2020 07:34   DG CHEST PORT 1 VIEW  Result Date: 03/17/2020 CLINICAL DATA:  Respiratory failure EXAM: PORTABLE CHEST 1 VIEW COMPARISON:  March 14, 2020 FINDINGS: Tracheostomy catheter tip is 6.2 cm above the carina. Feeding tube tip is below the diaphragm. No pneumothorax. There is a small left pleural effusion with consolidation in the left lower lobe. There is right base atelectasis with equivocal small right pleural effusion. Heart is mildly enlarged, stable, with pulmonary vascularity normal. No adenopathy. No bone lesions. IMPRESSION: Tube positions as described without pneumothorax. Left pleural effusion with equivocal right pleural effusion. Airspace opacity consistent with combination of atelectasis and pneumonia left lower lobe. Mild atelectasis right base. Stable cardiac prominence. Electronically Signed   By: Lowella Grip III M.D.   On: 03/17/2020 09:02   DG CHEST PORT 1 VIEW  Result Date: 03/14/2020 CLINICAL DATA:  Respiratory failure with hypercapnia EXAM: PORTABLE CHEST 1 VIEW COMPARISON:  March 10, 2020 FINDINGS: Tracheostomy catheter tip is 6.2 cm above the carina. There is ill-defined airspace opacity in the left upper lobe and left base regions with equivocal left pleural effusion. The right lung is clear. Heart is upper normal in size with pulmonary vascularity normal. No adenopathy. No bone lesions. IMPRESSION: Tracheostomy as  described without pneumothorax. Patchy airspace opacity consistent with scattered areas of pneumonia in the left upper lobe and left base. Equivocal left pleural effusion. Right lung clear. Stable cardiac silhouette. Electronically Signed   By: Lowella Grip III M.D.   On: 03/14/2020 09:17   DG Abd Portable 1V  Result Date: 04/07/2020 CLINICAL DATA:  Abdomen pain EXAM: PORTABLE ABDOMEN - 1 VIEW COMPARISON:  CT 03/15/2020 FINDINGS: Gastrostomy tube in the left mid abdomen. Nonobstructed bowel-gas pattern. Evidence of prior hernia repair. Curvilinear densities in the right lower quadrant, possible calcifications or retained contrast within appendix. IMPRESSION: Nonobstructed gas pattern. Electronically Signed   By: Donavan Foil M.D.   On: 04/07/2020 21:16   DG Swallowing Func-Speech Pathology  Result Date: 03/27/2020 Objective Swallowing Evaluation: Type of Study: MBS-Modified Barium Swallow Study  Patient Details Name: IYESHA SUCH MRN: [id number] Date of Birth: 04-17-1961 Today's Date: 03/27/2020 Time: SLP Start Time (ACUTE ONLY): 3903 -SLP Stop Time (ACUTE ONLY): 1235 SLP Time Calculation (min) (ACUTE ONLY): 20 min Past Medical History: Past Medical History: Diagnosis Date . Bronchitis  . Class 3 obesity 01/23/2020 . COPD (chronic obstructive pulmonary disease) ([place])  . Degenerative disc disease, lumbar  . Hiatal hernia  . Hypertension  Past Surgical History: Past Surgical History: Procedure Laterality Date . CHOLECYSTECTOMY   . degenerative bone disease   . DIRECT LARYNGOSCOPY N/A 03/13/2020  Procedure: DIRECT LARYNGOSCOPY WITH BIOPSY;  Surgeon: Jason Coop, DO;  Location: [facility];  Service: ENT;  Laterality: N/A; . INCISIONAL HERNIA REPAIR N/A 01/15/2019  Procedure: [name] HERNIORRHAPHY WITH MESH;  Surgeon: [name], MD;  Location: [facility];  Service: General;  Laterality: N/A; . IR GASTROSTOMY TUBE MOD SED  03/22/2020 . OMENTECTOMY N/A 01/15/2019  Procedure: OMENTECTOMY;  Surgeon:  [name], MD;  Location: [facility];  Service: General;  Laterality: N/A; . TRACHEOSTOMY TUBE PLACEMENT N/A 03/13/2020  Procedure: AWAKE TRACHEOSTOMY;  Surgeon: Jason Coop, DO;  Location: [facility];  Service: ENT;  Laterality: N/A; HPI: Minervia Osso is a 59 y/o  F with history of COPD,chronic respiratory failure on home oxygen at 5 L/min, with large laryngeal mass lesion, now s/p tracheostomy with Shiley 6-0 and direct laryngoscopy and biopsy. 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  Pt is also s/p PEG for nutrition.  Plans are for chemo and radiation concurrently starting while in hospital.  Prior medical history includes smoking and some dyspnea if walking long distanced.  Subjective: pt awake in chair Assessment / Plan / Recommendation CHL IP CLINICAL IMPRESSIONS 03/27/2020 Clinical Impression Pt continues with with mild pharyngeal phase dysphagia - obstructive due to mass impacting epiglottic deflection.  In addition, she requires extra time to masticate solids due to dentition.  Swallow trigger is timely with minimal laryngeal penetration of liquid into larynx during the swallow due to decreased epiglottic deflection.  Min vallecular residue with solids without pt awareness - liquid wash assisted to clear.  Pt with prominent cricopharyngeus and appears with trace backflow near this region but view was suboptimal.  Recommend D3/mech soft and thin liquids, po medications whole in puree and follow with liquid wash.  Of note, following testing, pt did cough and slight whitish coating noted on secretions but no aspiration noted during testing.  Suspect pt has some low grade chronic secretion aspiration. SLP Visit Diagnosis Dysphagia, oropharyngeal phase (R13.12) Attention and concentration deficit following -- Frontal lobe and executive function deficit following -- Impact on safety and function Mild aspiration risk   CHL IP TREATMENT RECOMMENDATION 03/27/2020 Treatment Recommendations  Therapy as outlined in treatment plan below   Prognosis 03/27/2020 Prognosis for Safe Diet Advancement Fair Barriers to Reach Goals Other (Comment) Barriers/Prognosis Comment -- CHL IP DIET RECOMMENDATION 03/27/2020 SLP Diet Recommendations Dysphagia 3 (Mech soft) solids;Thin liquid Liquid Administration via -- Medication Administration Whole meds with puree Compensations Slow rate;Small sips/bites Postural Changes --   CHL IP OTHER RECOMMENDATIONS 03/27/2020 Recommended Consults (No Data) Oral Care Recommendations Oral care BID Other Recommendations Clarify dietary restrictions   CHL IP FOLLOW UP RECOMMENDATIONS 03/27/2020 Follow up Recommendations Skilled Nursing facility   [facility] IP FREQUENCY AND DURATION 03/27/2020 Speech Therapy Frequency (ACUTE ONLY) min 1 x/week Treatment Duration 1 week      CHL IP ORAL PHASE 03/27/2020 Oral Phase WFL Oral - Pudding Teaspoon -- Oral - Pudding Cup -- Oral - Honey Teaspoon -- Oral - Honey Cup -- Oral - Nectar Teaspoon -- Oral - Nectar Cup -- Oral - Nectar Straw -- Oral - Thin Teaspoon -- Oral - Thin Cup -- Oral - Thin Straw -- Oral - Puree -- Oral - Mech Soft -- Oral - Regular -- Oral - Multi-Consistency -- Oral - Pill -- Oral Phase - Comment --  CHL IP PHARYNGEAL PHASE 03/27/2020 Pharyngeal Phase -- Pharyngeal- Pudding Teaspoon -- Pharyngeal -- Pharyngeal- Pudding Cup -- Pharyngeal -- Pharyngeal- Honey Teaspoon -- Pharyngeal -- Pharyngeal- Honey Cup -- Pharyngeal -- Pharyngeal- Nectar Teaspoon -- Pharyngeal -- Pharyngeal- Nectar Cup -- Pharyngeal -- Pharyngeal- Nectar Straw Reduced epiglottic inversion;Pharyngeal residue - valleculae Pharyngeal -- Pharyngeal- Thin Teaspoon Reduced epiglottic inversion;Pharyngeal residue - valleculae Pharyngeal -- Pharyngeal- Thin Cup Pharyngeal residue - valleculae;Reduced epiglottic inversion;Penetration/Aspiration during swallow Pharyngeal Material enters airway, remains ABOVE vocal cords then ejected out Pharyngeal- Thin Straw Reduced  epiglottic inversion;Pharyngeal residue - valleculae;Penetration/Aspiration during swallow Pharyngeal Material enters airway, remains ABOVE vocal cords then ejected out Pharyngeal- Puree WFL Pharyngeal -- Pharyngeal- Mechanical Soft -- Pharyngeal -- Pharyngeal- Regular Reduced epiglottic inversion Pharyngeal -- Pharyngeal- Multi-consistency -- Pharyngeal -- Pharyngeal- Pill NT Pharyngeal -- Pharyngeal Comment --  CHL IP CERVICAL ESOPHAGEAL PHASE 03/27/2020 Cervical Esophageal Phase -- Pudding Teaspoon -- Pudding Cup -- Honey Teaspoon -- Honey Cup -- Nectar Teaspoon -- Nectar Cup -- Nectar Straw -- Thin Teaspoon -- Thin Cup Prominent cricopharyngeal segment Thin Straw -- Puree -- Mechanical Soft -- Regular -- Multi-consistency -- Pill -- Cervical Esophageal Comment appearance of potential minimal backflow of liquid near UES region = suboptimal view - did not backflow into pharynx/larynx [name], MS [facility] SLP Acute Rehab Services Office [phone] Pager [phone] [name] 03/27/2020, 2:31 PM                Time Spent in minutes  30     Desiree Hane M.D on 04/11/2020 at 4:39 PM  To page go to [email or web address] - password [facility]

## 2020-04-11 NOTE — H&P (View-Only) (Signed)
[facility] Gastroenterology Progress Note  Joscelynn Brutus JFHLK 59 y.o. 07/09/1960  CC: Abdominal pain, abnormal CT scan   Subjective: Patient seen and examined at bedside.  Flexible sigmoidoscopy this morning was canceled because of respiratory issues.  She is feeling better now.  ROS : Afebrile.  Negative for acute chest pain.   Objective: Vital signs in last 24 hours: Vitals:   04/11/20 1112 04/11/20 1145  BP:  115/65  Pulse: 60 (!) 59  Resp:  16  Temp:  98.9 F (37.2 C)  SpO2: 92% 93%    Physical Exam:  General:  Alert, cooperative, tracheostomy in place  Head:  Normocephalic, without obvious abnormality, atraumatic  Eyes:  , EOM's intact,   Lungs:    Coarse breath sounds  Heart:  Regular rate and rhythm, S1, S2 normal  Abdomen:   Soft, non-tender, nondistended, bowel sounds present.  No peritoneal signs  Extremities: Extremities normal, atraumatic, no  edema    Lab Results: Recent Labs    04/10/20 0617 04/11/20 0530  NA 134* 135  K 4.4 4.2  CL 99 99  CO2 28 26  GLUCOSE 101* 91  BUN 23* 21*  CREATININE 1.21* 1.14*  CALCIUM 8.0* 8.3*   Recent Labs    04/09/20 0830  AST 16  ALT 15  ALKPHOS 54  BILITOT 0.7  PROT 5.6*  ALBUMIN 2.7*   Recent Labs    04/10/20 0617 04/11/20 0530  WBC 7.6 5.6  HGB 10.3* 10.7*  HCT 32.1* 32.9*  MCV 97.6 97.3  PLT 157 166   No results for input(s): LABPROT, INR in the last 72 hours.    Assessment/Plan: -Abnormal CT scan concerning for rectal wall thickening.  Could be proctitis versus tumor. -Supraglottic laryngeal cancer.  Status post tracheostomy.  Currently receiving chemoradiation. -COPD  Recommendations -------------------------- -Flexible sigmoidoscopy this morning was canceled because of respiratory issues.  She is feeling better now.  Reschedule flex sig for tomorrow. -Continue other supportive care.  GI will follow.  Okay to have diet today.  Hold tube feeding and diet from midnight.   Otis Brace MD,  [place] 04/11/2020, 1:06 PM  Contact #  [phone]

## 2020-04-11 NOTE — Progress Notes (Signed)
[facility] Gastroenterology Progress Note  Veronica Horton EVOJJ 59 y.o. 1960-12-28  CC: Abdominal pain, abnormal CT scan   Subjective: Patient seen and examined at bedside.  Flexible sigmoidoscopy this morning was canceled because of respiratory issues.  She is feeling better now.  ROS : Afebrile.  Negative for acute chest pain.   Objective: Vital signs in last 24 hours: Vitals:   04/11/20 1112 04/11/20 1145  BP:  115/65  Pulse: 60 (!) 59  Resp:  16  Temp:  98.9 F (37.2 C)  SpO2: 92% 93%    Physical Exam:  General:  Alert, cooperative, tracheostomy in place  Head:  Normocephalic, without obvious abnormality, atraumatic  Eyes:  , EOM's intact,   Lungs:    Coarse breath sounds  Heart:  Regular rate and rhythm, S1, S2 normal  Abdomen:   Soft, non-tender, nondistended, bowel sounds present.  No peritoneal signs  Extremities: Extremities normal, atraumatic, no  edema    Lab Results: Recent Labs    04/10/20 0617 04/11/20 0530  NA 134* 135  K 4.4 4.2  CL 99 99  CO2 28 26  GLUCOSE 101* 91  BUN 23* 21*  CREATININE 1.21* 1.14*  CALCIUM 8.0* 8.3*   Recent Labs    04/09/20 0830  AST 16  ALT 15  ALKPHOS 54  BILITOT 0.7  PROT 5.6*  ALBUMIN 2.7*   Recent Labs    04/10/20 0617 04/11/20 0530  WBC 7.6 5.6  HGB 10.3* 10.7*  HCT 32.1* 32.9*  MCV 97.6 97.3  PLT 157 166   No results for input(s): LABPROT, INR in the last 72 hours.    Assessment/Plan: -Abnormal CT scan concerning for rectal wall thickening.  Could be proctitis versus tumor. -Supraglottic laryngeal cancer.  Status post tracheostomy.  Currently receiving chemoradiation. -COPD  Recommendations -------------------------- -Flexible sigmoidoscopy this morning was canceled because of respiratory issues.  She is feeling better now.  Reschedule flex sig for tomorrow. -Continue other supportive care.  GI will follow.  Okay to have diet today.  Hold tube feeding and diet from midnight.   Otis Brace MD,  [place] 04/11/2020, 1:06 PM  Contact #  [phone]

## 2020-04-12 ENCOUNTER — Inpatient Hospital Stay (HOSPITAL_COMMUNITY): Payer: Medicaid Other | Admitting: Anesthesiology

## 2020-04-12 ENCOUNTER — Inpatient Hospital Stay: Payer: Medicaid Other

## 2020-04-12 ENCOUNTER — Ambulatory Visit
Admit: 2020-04-12 | Discharge: 2020-04-12 | Disposition: A | Discharge status: Home / Self Care | Payer: Medicaid Other | Attending: Radiation Oncology | Admitting: Radiation Oncology

## 2020-04-12 ENCOUNTER — Encounter (HOSPITAL_COMMUNITY)
Admission: EM | Disposition: A | Discharge status: Home / Self Care | Payer: Self-pay | Source: Home / Self Care | Attending: Internal Medicine

## 2020-04-12 ENCOUNTER — Encounter (HOSPITAL_COMMUNITY): Payer: Self-pay | Admitting: Internal Medicine

## 2020-04-12 DIAGNOSIS — J9601 Acute respiratory failure with hypoxia: Secondary | ICD-10-CM

## 2020-04-12 DIAGNOSIS — R935 Abnormal findings on diagnostic imaging of other abdominal regions, including retroperitoneum: Secondary | ICD-10-CM | POA: Clinically undetermined

## 2020-04-12 HISTORY — PX: POLYPECTOMY: SHX5525

## 2020-04-12 HISTORY — PX: BIOPSY: SHX5522

## 2020-04-12 HISTORY — PX: FLEXIBLE SIGMOIDOSCOPY: SHX5431

## 2020-04-12 LAB — GLUCOSE, CAPILLARY
Glucose-Capillary: 103 mg/dL — ABNORMAL HIGH (ref 70–99)
Glucose-Capillary: 113 mg/dL — ABNORMAL HIGH (ref 70–99)
Glucose-Capillary: 116 mg/dL — ABNORMAL HIGH (ref 70–99)
Glucose-Capillary: 130 mg/dL — ABNORMAL HIGH (ref 70–99)
Glucose-Capillary: 86 mg/dL (ref 70–99)
Glucose-Capillary: 87 mg/dL (ref 70–99)
Glucose-Capillary: 92 mg/dL (ref 70–99)

## 2020-04-12 LAB — CBC
HCT: 30.7 % — ABNORMAL LOW (ref 36.0–46.0)
Hemoglobin: 10.3 g/dL — ABNORMAL LOW (ref 12.0–15.0)
MCH: 32.2 pg (ref 26.0–34.0)
MCHC: 33.6 g/dL (ref 30.0–36.0)
MCV: 95.9 fL (ref 80.0–100.0)
Platelets: 189 10*3/uL (ref 150–400)
RBC: 3.2 MIL/uL — ABNORMAL LOW (ref 3.87–5.11)
RDW: 14.4 % (ref 11.5–15.5)
WBC: 5.9 10*3/uL (ref 4.0–10.5)
nRBC: 0 % (ref 0.0–0.2)

## 2020-04-12 LAB — BASIC METABOLIC PANEL
Anion gap: 8 (ref 5–15)
BUN: 21 mg/dL — ABNORMAL HIGH (ref 6–20)
CO2: 27 mmol/L (ref 22–32)
Calcium: 7.8 mg/dL — ABNORMAL LOW (ref 8.9–10.3)
Chloride: 97 mmol/L — ABNORMAL LOW (ref 98–111)
Creatinine, Ser: 1.17 mg/dL — ABNORMAL HIGH (ref 0.44–1.00)
GFR, Estimated: 53 mL/min — ABNORMAL LOW (ref >60)
Glucose, Bld: 91 mg/dL (ref 70–99)
Potassium: 4.2 mmol/L (ref 3.5–5.1)
Sodium: 132 mmol/L — ABNORMAL LOW (ref 135–145)

## 2020-04-12 LAB — CULTURE, RESPIRATORY W GRAM STAIN: Gram Stain: NONE SEEN

## 2020-04-12 SURGERY — SIGMOIDOSCOPY, FLEXIBLE
Anesthesia: Monitor Anesthesia Care

## 2020-04-12 MED ORDER — IPRATROPIUM-ALBUTEROL 0.5-2.5 (3) MG/3ML IN SOLN
3.0000 mL | Freq: Three times a day (TID) | RESPIRATORY_TRACT | Status: DC
  Administered 2020-04-12 – 2020-04-16 (×14): 3 mL via RESPIRATORY_TRACT
  Filled 2020-04-12 (×14): qty 3

## 2020-04-12 MED ORDER — LIDOCAINE HCL (CARDIAC) PF 100 MG/5ML IV SOSY
PREFILLED_SYRINGE | INTRAVENOUS | Status: DC | PRN
  Administered 2020-04-12 (×2): 50 mg via INTRAVENOUS

## 2020-04-12 MED ORDER — MIDAZOLAM HCL 5 MG/5ML IJ SOLN
INTRAMUSCULAR | Status: DC | PRN
  Administered 2020-04-12 (×2): 1 mg via INTRAVENOUS

## 2020-04-12 MED ORDER — MIDAZOLAM HCL 2 MG/2ML IJ SOLN
INTRAMUSCULAR | Status: AC
  Filled 2020-04-12: qty 2

## 2020-04-12 MED ORDER — PROPOFOL 10 MG/ML IV BOLUS
INTRAVENOUS | Status: DC | PRN
  Administered 2020-04-12 (×2): 10 mg via INTRAVENOUS

## 2020-04-12 NOTE — Brief Op Note (Signed)
Flex sig revealed a large rectal ulcer likely a stercoral ulcer and doubt malignancy. Biopsies taken. Benign-appearing distal rectal polyp removed with snare cautery. Supportive care. Laxatives. Will call path results as outpt. Will sign off. Call if questions. D/W Dr. Lonny Prude.

## 2020-04-12 NOTE — Plan of Care (Signed)
  Problem: Health Behavior/Discharge Planning: Goal: Ability to manage health-related needs will improve Outcome: Progressing   Problem: Clinical Measurements: Goal: Ability to maintain clinical measurements within normal limits will improve Outcome: Progressing Goal: Will remain free from infection Outcome: Progressing Goal: Diagnostic test results will improve Outcome: Progressing Goal: Respiratory complications will improve Outcome: Progressing Goal: Cardiovascular complication will be avoided Outcome: Progressing   Problem: Activity: Goal: Risk for activity intolerance will decrease Outcome: Progressing   Problem: Nutrition: Goal: Adequate nutrition will be maintained Outcome: Progressing   Problem: Coping: Goal: Level of anxiety will decrease Outcome: Progressing   Problem: Elimination: Goal: Will not experience complications related to bowel motility Outcome: Progressing Goal: Will not experience complications related to urinary retention Outcome: Progressing   Problem: Pain Managment: Goal: General experience of comfort will improve Outcome: Progressing   Problem: Safety: Goal: Ability to remain free from injury will improve Outcome: Progressing   Problem: Skin Integrity: Goal: Risk for impaired skin integrity will decrease Outcome: Progressing   Problem: Education: Goal: Knowledge about tracheostomy care/management will improve Outcome: Progressing   Problem: Activity: Goal: Ability to tolerate increased activity will improve Outcome: Progressing   Problem: Health Behavior/Discharge Planning: Goal: Ability to manage tracheostomy will improve Outcome: Progressing   Problem: Respiratory: Goal: Patent airway maintenance will improve Outcome: Progressing   Problem: Role Relationship: Goal: Ability to communicate will improve Outcome: Progressing   Problem: Education: Goal: Knowledge of disease or condition will improve Outcome:  Progressing Goal: Knowledge of the prescribed therapeutic regimen will improve Outcome: Progressing Goal: Individualized Educational Video(s) Outcome: Progressing   Problem: Activity: Goal: Ability to tolerate increased activity will improve Outcome: Progressing Goal: Will verbalize the importance of balancing activity with adequate rest periods Outcome: Progressing   Problem: Respiratory: Goal: Ability to maintain a clear airway will improve Outcome: Progressing Goal: Levels of oxygenation will improve Outcome: Progressing Goal: Ability to maintain adequate ventilation will improve Outcome: Progressing   Problem: Education: Goal: Knowledge about tracheostomy care/management will improve Outcome: Progressing   Problem: Activity: Goal: Ability to tolerate increased activity will improve Outcome: Progressing   Problem: Health Behavior/Discharge Planning: Goal: Ability to manage tracheostomy will improve Outcome: Progressing   Problem: Respiratory: Goal: Patent airway maintenance will improve Outcome: Progressing   Problem: Role Relationship: Goal: Ability to communicate will improve Outcome: Progressing   Problem: Education: Goal: Knowledge of the prescribed therapeutic regimen will improve Outcome: Progressing   Problem: Activity: Goal: Ability to implement measures to reduce episodes of fatigue will improve Outcome: Progressing   Problem: Bowel/Gastric: Goal: Will not experience complications related to bowel motility Outcome: Progressing   Problem: Coping: Goal: Ability to identify and develop effective coping behavior will improve Outcome: Progressing   Problem: Education: Goal: Knowledge of the prescribed therapeutic regimen will improve Outcome: Progressing   Problem: Activity: Goal: Ability to perform activities at highest level will improve Outcome: Progressing   Problem: Bowel/Gastric: Goal: Bowel function will improve Outcome: Progressing    Problem: Nutritional: Goal: Maintenance of adequate nutrition will improve Outcome: Progressing   Problem: Clinical Measurements: Goal: Will remain free from infection Outcome: Progressing   Problem: Skin Integrity: Goal: Status of oral mucous membranes will improve Outcome: Progressing

## 2020-04-12 NOTE — Interval H&P Note (Signed)
History and Physical Interval Note:  04/12/2020 11:54 AM  [place]  has presented today for surgery, with the diagnosis of bleeding.  The various methods of treatment have been discussed with the patient and family. After consideration of risks, benefits and other options for treatment, the patient has consented to  Procedure(s): FLEXIBLE SIGMOIDOSCOPY (N/A) as a surgical intervention.  The patient's history has been reviewed, patient examined, no change in status, stable for surgery.  I have reviewed the patient's chart and labs.  Questions were answered to the patient's satisfaction.     Lear Ng

## 2020-04-12 NOTE — Progress Notes (Signed)
Erroneous encounter

## 2020-04-12 NOTE — Transfer of Care (Signed)
Immediate Anesthesia Transfer of Care Note  Patient: LOYAL RUDY  Procedure(s) Performed: FLEXIBLE SIGMOIDOSCOPY (N/A ) BIOPSY POLYPECTOMY  Patient Location: Endoscopy Unit  Anesthesia Type:MAC  Level of Consciousness: awake  Airway & Oxygen Therapy: Patient Spontanous Breathing and Patient connected to T-piece oxygen  Post-op Assessment: Report given to RN and Post -op Vital signs reviewed and stable  Post vital signs: Reviewed and stable  Last Vitals:  Vitals Value Taken Time  BP    Temp    Pulse 65 04/12/20 1242  Resp 19 04/12/20 1242  SpO2 88 % 04/12/20 1242  Vitals shown include unvalidated device data.  Last Pain:  Vitals:   04/12/20 1128  TempSrc:   PainSc: 9       Patients Stated Pain Goal: 4 (35/32/99 2426)  Complications: No complications documented.

## 2020-04-12 NOTE — Anesthesia Postprocedure Evaluation (Addendum)
Anesthesia Post Note  Patient: Veronica Horton  Procedure(s) Performed: FLEXIBLE SIGMOIDOSCOPY (N/A ) BIOPSY POLYPECTOMY     Patient location during evaluation: Endoscopy Anesthesia Type: MAC Level of consciousness: awake and alert Pain management: pain level controlled Vital Signs Assessment: post-procedure vital signs reviewed and stable Respiratory status: spontaneous breathing and patient connected to tracheostomy mask oxygen (at pre-procedure baseline) Cardiovascular status: blood pressure returned to baseline and stable Postop Assessment: no apparent nausea or vomiting Anesthetic complications: no   No complications documented.  Last Vitals:  Vitals:   04/12/20 1310 04/12/20 1320  BP: (!) 93/36 (!) 95/39  Pulse: 71 65  Resp: 18 (!) 21  Temp:    SpO2: 96% 90%    Last Pain:  Vitals:   04/12/20 1320  TempSrc:   PainSc: 0-No pain                 Lidia Collum

## 2020-04-12 NOTE — Op Note (Signed)
[facility] Patient Name: Veronica Horton Procedure Date: 04/12/2020 MRN: [id number] Attending MD: Lear Ng , MD Date of Birth: 02/13/1961 CSN: [id number] Age: 59 Admit Type: Inpatient Procedure:                Flexible Sigmoidoscopy Indications:              Lower abdominal pain, Abnormal CT of the GI tract Providers:                Lear Ng, MD, Cleda Daub, RN, Elspeth Cho Tech., Technician, Signa Kell, CRNA Referring MD:             Benay Pike; hospital team Medicines:                Propofol per Anesthesia, Monitored Anesthesia Care Complications:            No immediate complications. Estimated Blood Loss:     Estimated blood loss was minimal. Procedure:                Pre-Anesthesia Assessment:                           - Prior to the procedure, a History and Physical                            was performed, and patient medications and                            allergies were reviewed. The patient's tolerance of                            previous anesthesia was also reviewed. The risks                            and benefits of the procedure and the sedation                            options and risks were discussed with the patient.                            All questions were answered, and informed consent                            was obtained. Prior Anticoagulants: The patient has                            taken no previous anticoagulant or antiplatelet                            agents. ASA Grade Assessment: IV - A patient with                            severe systemic disease that is a constant threat  to life. After reviewing the risks and benefits,                            the patient was deemed in satisfactory condition to                            undergo the procedure.                           After obtaining informed consent, the scope was                             passed under direct vision. The PCF-H190DL                            ([id number] Olympus pediatric colonscope was                            introduced through the anus and advanced to the the                            sigmoid colon. The flexible sigmoidoscopy was                            accomplished without difficulty. The patient                            tolerated the procedure well. The quality of the                            bowel preparation was fair. Scope In: Scope Out: Findings:      The perianal exam findings include non-thrombosed external hemorrhoids.      A single (solitary) twenty mm ulcer was found in the rectum. No bleeding       was present. Stigmata of recent bleeding were present.      A segmental area of moderately congested and erythematous mucosa was       found in the rectum. Biopsies were taken with a cold forceps for       histology. Estimated blood loss was minimal.      A 10 mm polyp was found in the distal rectum. The polyp was       semi-pedunculated. The polyp was removed with a hot snare. Resection and       retrieval were complete. Estimated blood loss: none.      Internal hemorrhoids were found during retroflexion. The hemorrhoids       were small and Grade I (internal hemorrhoids that do not prolapse). Impression:               - Preparation of the colon was fair.                           - Non-thrombosed external hemorrhoids found on                            perianal exam.                           -  A single (solitary) ulcer in the rectum.                           - Congested and erythematous mucosa in the rectum.                            Biopsied.                           - One 10 mm polyp in the distal rectum, removed                            with a hot snare. Resected and retrieved.                           - Internal hemorrhoids. Moderate Sedation:      Not Applicable - Patient had care per Anesthesia. Recommendation:            - Resume previous diet.                           - Await pathology results. Procedure Code(s):        --- Professional ---                           [phone], Sigmoidoscopy, flexible; with removal of                            tumor(s), polyp(s), or other lesion(s) by snare                            technique                           [id number], 47, Sigmoidoscopy, flexible; with biopsy,                            single or multiple Diagnosis Code(s):        --- Professional ---                           R10.30, Lower abdominal pain, unspecified                           K62.1, Rectal polyp                           R93.3, Abnormal findings on diagnostic imaging of                            other parts of digestive tract                           K62.89, Other specified diseases of anus and rectum                           K62.6, Ulcer of anus and rectum  K64.4, Residual hemorrhoidal skin tags                           K64.0, First degree hemorrhoids CPT copyright 2019 [employer]. All rights reserved. The codes documented in this report are preliminary and upon coder review may  be revised to meet current compliance requirements. Lear Ng, MD 04/12/2020 12:41:12 PM This report has been signed electronically. Number of Addenda: 0

## 2020-04-12 NOTE — Progress Notes (Signed)
PT Cancellation Note  Patient Details Name: Veronica Horton MRN: [id number] DOB: 04-22-1961   Cancelled Treatment:     pt at procedure this morning then was seen amb in hallway with nursing.   Pt has been evaluated with rec for [place]  PTA Acute  Rehabilitation Services Pager      [phone] Office      [phone]

## 2020-04-12 NOTE — Progress Notes (Signed)
[facility]  PROGRESS NOTE  Veronica Horton WVP:[id number] DOB: April 03, 1961 DOA: 03/10/2020 PCP: [facility] date - 03/10/2020   Admitting Physician Veronica Patricia, MD  Outpatient Primary MD for the patient is [facility] is a 59 y.o. year old female with medical history significant for COPD, chronic hypoxic respiratory failure on 5 L O2, hiatal hernia, morbid obesity who presented on 03/10/2020 with shortness of breath thus far have COPD exacerbation.  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  Has been at [facility] since 11/17 radiation therapy, also had PEG tube placed on 11/22.  Currently receiving chemoradiation.  Planned flex sig and anoscopy on 12/12 canceled due to respiratory issues.  Repeat chest x-ray shows right middle lobe atelectasis and stable pleural effusions/atelectasis.    Subjective  Coughing but less productive, feels breathing is better.  Still has lower abdominal pain.  A & P   Lower quadrant abdominal pain, CT abdomen showing rectal wall thickening and perirectal infiltrate changes concerning for proctitis. Malignancy as well as radiation associated proctitis also on differential. Still having abdominal pain despite optimization of bowel regimen and having nonbloody bowel movements febrile, no leukocytosis or leukopenia since starting antibiotics  - IV cipro and flagy (listed allergy to penicillins). IV Given poor po intake related to abdominal pain -monitor blood cultures -GI plans for endoscopic evaluation on 12/13 (held on 12/12 due to some respiratory issues this a.m. that are now resolved), will await further recommendations -bowel regimen continue Colace twice daily, add MiraLAX twice daily -Continue  PPI  Fever in the setting of  likely proctitis.  Afebrile overnight, has no leukocytosis, blood pressures stable at 115/65 -Continue IV cipro and flagyl given concern for proctitis -Monitor blood cultures -Closely monitor blood pressure, on telemetry  Supraglottic cell laryngeal cancer, status post tracheostomy and PEG placement.  Patient declined total laryngectomy -Responding well to chemochterapy, discussed with Veronica Horton on 12/9 -Receiving radiation  Acute on chronic hypoxic/hypercarbic respiratory failure, stable.  Likely multifactorial etiology including COPD further exacerbated by laryngeal squamous cell carcinoma as well aspiration pneumonia.  Chest x-ray on 12/12 shows bibasilar atelectasis and right middle lobe atelectasis.  Tracheal aspirate culture growing Serratia pansensitive (only resistant to Keflex) -On size 6 uncuffed Shiley placed by PCCM on 12/2 - scheduled duo nebs to every 6, pending findings refluxate may transition to cefdinir -Still requiring 10 L via trach, previous home regimen 5 L, will need to wean to be able to be safe from a disposition standpoint to go home,  goal SPO2 greater than 88% -Encourage incentive spirometry use -Continue Pulmicort and duo nebs inhalers  AKI, improving.  Baseline creatinine 0.9-1.  Currently 1.1 suspect likely prerenal given diminished appetite related to ongoing/intermittent abdominal pain. -Avoid nephrotoxins -Monitor output -Monitor BMP  COPD, no wheezing.  Still has rhonchi on exam, chest x-ray shows atelectasis -Continue inhaler regimen - scheduled DuoNebs every 6 hours given increased rhonchi and productive cough  Hypoglycemia episodes.  G this a.m. 103.  Currently n.p.o. for flex sig  -Continue close monitor CBG on insulin regimen  GERD -continue PPI     Family Communication  : Updated Veronica Horton, sister on 12/12 by phone  Code Status : Full  Disposition Plan  :  Patient is from home. Anticipated d/c date: 2 to 3 days.  Barriers to d/c or  necessity for inpatient status:  IV cipro and flagyl per tube for proctitis, GI consulted plan for flex sig on 12/13, still requiring fairly high amounts of oxygen currently at 10 L trach support, case management assisting with disposition patient will likely need home health therapy at minimum Consults  : Oncology and GI  Procedures  :    DVT Prophylaxis  :  Lovenox  MDM: The below labs and imaging reports were reviewed and summarized above.  Medication management as above.  Lab Results  Component Value Date   PLT 189 04/12/2020    Diet :  Diet Order            Diet NPO time specified  Diet effective midnight                  Inpatient Medications Scheduled Meds: . [MAR Hold] arformoterol  15 mcg Nebulization BID  . [MAR Hold] bisacodyl  10 mg Rectal Once  . [MAR Hold] budesonide (PULMICORT) nebulizer solution  0.5 mg Nebulization BID  . [MAR Hold] chlorhexidine  15 mL Mouth Rinse BID  . [MAR Hold] docusate  100 mg Per Tube BID  . [MAR Hold] enoxaparin (LOVENOX) injection  60 mg Subcutaneous Daily  . [MAR Hold] feeding supplement  237 mL Oral TID BM  . [MAR Hold] feeding supplement (OSMOLITE 1.5 CAL)  237 mL Per Tube TID BM  . [MAR Hold] gabapentin  600 mg Oral QHS  . [MAR Hold] insulin aspart  0-9 Units Subcutaneous Q4H  . [MAR Hold] ipratropium-albuterol  3 mL Nebulization TID  . [MAR Hold] ketotifen  1 drop Both Eyes BID  . [MAR Hold] lidocaine  1 patch Transdermal Q24H  . [MAR Hold] mouth rinse  15 mL Mouth Rinse q12n4p  . [MAR Hold] melatonin  5 mg Oral QHS  . [MAR Hold] metroNIDAZOLE  500 mg Per Tube Q8H  . [MAR Hold] multivitamin with minerals  1 tablet Per Tube Daily  . [MAR Hold] nicotine  21 mg Transdermal Daily  . [MAR Hold] pantoprazole sodium  40 mg Per Tube Daily  . [MAR Hold] polyethylene glycol  17 g Per Tube BID  . [MAR Hold] scopolamine  1 patch Transdermal Q72H   Continuous Infusions: . [MAR Hold] sodium chloride    . sodium chloride 20 mL/hr at  04/12/20 1201  . [MAR Hold] ciprofloxacin 400 mg (04/12/20 0616)   PRN Meds:.[MAR Hold] sodium chloride, [MAR Hold] acetaminophen (TYLENOL) oral liquid 160 mg/5 mL, [MAR Hold] bisacodyl, [MAR Hold] diclofenac Sodium, [MAR Hold] diphenhydrAMINE, glucagon, [MAR Hold] guaiFENesin-dextromethorphan, [MAR Hold] hydrOXYzine, [MAR Hold] ipratropium-albuterol, lidocaine (PF), [MAR Hold] magic mouthwash w/lidocaine, [MAR Hold] oxyCODONE, [MAR Hold] prochlorperazine **OR** [MAR Hold] prochlorperazine  Antibiotics  :   Anti-infectives (From admission, onward)   Start     Dose/Rate Route Frequency Ordered Stop   04/09/20 1800  [MAR Hold]  ciprofloxacin (CIPRO) IVPB 400 mg        (MAR Hold since Mon 04/12/2020 at 1112.Hold Reason: Transfer to a Procedural area.)   400 mg 200 mL/hr over 60 Minutes Intravenous Every 12 hours 04/09/20 1705     04/09/20 1800  [MAR Hold]  metroNIDAZOLE (FLAGYL) tablet 500 mg        (MAR Hold since Mon 04/12/2020 at 1112.Hold Reason: Transfer to a Procedural area.)   500 mg Per Tube Every 8 hours 04/09/20 1705     03/22/20 1700  vancomycin (VANCOCIN) IVPB 1000 mg/200 mL premix  Status:  Discontinued        1,000 mg 200 mL/hr over 60 Minutes Intravenous  Once 03/22/20 1605 03/23/20 0236   03/22/20 1604  vancomycin (VANCOCIN) 1-5 GM/200ML-% IVPB       Note to Pharmacy: [name]   : cabinet override      03/22/20 1604 03/22/20 1615   03/14/20 1100  levofloxacin (LEVAQUIN) IVPB 750 mg        750 mg 100 mL/hr over 90 Minutes Intravenous Daily 03/14/20 0955 03/19/20 1146   03/10/20 1730  doxycycline (VIBRAMYCIN) 100 mg in sodium chloride 0.9 % 250 mL IVPB  Status:  Discontinued        100 mg 125 mL/hr over 120 Minutes Intravenous Every 12 hours 03/10/20 1634 03/14/20 0955   03/10/20 1600  azithromycin (ZITHROMAX) 500 mg in sodium chloride 0.9 % 250 mL IVPB  Status:  Discontinued        500 mg 250 mL/hr over 60 Minutes Intravenous Every 24 hours 03/10/20 1529 03/10/20 1538    03/10/20 1500  clindamycin (CLEOCIN) IVPB 600 mg  Status:  Discontinued        600 mg 100 mL/hr over 30 Minutes Intravenous  Once 03/10/20 1458 03/10/20 1521       Objective   Vitals:   04/12/20 0504 04/12/20 0736 04/12/20 1000 04/12/20 1128  BP: (!) 100/45   (!) 123/29  Pulse: 64   60  Resp: 18   20  Temp: 98.7 F (37.1 C)     TempSrc: Oral     SpO2: 92% (!) 89% 100% 90%  Weight:      Height:        SpO2: 90 % O2 Flow Rate (L/min): 10 L/min FiO2 (%): 60 %  Wt Readings from Last 3 Encounters:  04/12/20 104.7 kg  01/24/20 111.8 kg  01/30/19 (!) 136.3 kg     Intake/Output Summary (Last 24 hours) at 04/12/2020 1226 Last data filed at 04/12/2020 1222 Gross per 24 hour  Intake 1100.87 ml  Output 900 ml  Net 200.87 ml    Physical Exam:  Awake Alert, Oriented X 3, flat affect No new F.N deficits,  [place].AT, Normal respiratory effort on trach collar, no wheezing, rhonchi present RRR,No Gallops,Rubs or new Murmurs,  Abd Soft, tender in lower quadrant with deep palpation,no tenderness around PEG tube No Cyanosis, No new Rash or bruise    I have personally reviewed the following:   Data Reviewed:  CBC Recent Labs  Lab 04/07/20 0800 04/08/20 0517 04/09/20 0830 04/10/20 0617 04/11/20 0530 04/12/20 0515  WBC 7.4 6.0 9.5 7.6 5.6 5.9  HGB 10.9* 11.1* 11.1* 10.3* 10.7* 10.3*  HCT 33.9* 34.7* 33.6* 32.1* 32.9* 30.7*  PLT 177 151 184 157 166 189  MCV 98.3 96.7 96.3 97.6 97.3 95.9  MCH 31.6 30.9 31.8 31.3 31.7 32.2  MCHC 32.2 32.0 33.0 32.1 32.5 33.6  RDW 14.3 14.2 14.3 14.4 14.3 14.4  LYMPHSABS 0.9  --   --   --   --   --   MONOABS 0.8  --   --   --   --   --   EOSABS 0.0  --   --   --   --   --   BASOSABS 0.0  --   --   --   --   --     Chemistries  Recent Labs  Lab 04/07/20 0800 04/08/20 0517 04/09/20 0830 04/10/20 0617 04/11/20 0530 04/12/20 0515  NA  134* 133* 135 134* 135 132*  K 4.4 4.8 4.5 4.4 4.2 4.2  CL 99 98 99 99 99 97*  CO2 27 25 27 28 26  27   GLUCOSE 126* 86 89 101* 91 91  BUN 34* 31* 28* 23* 21* 21*  CREATININE 1.36* 1.18* 1.18* 1.21* 1.14* 1.17*  CALCIUM 8.3* 8.3* 8.1* 8.0* 8.3* 7.8*  MG 2.3  --   --   --   --   --   AST  --  14* 16  --   --   --   ALT  --  15 15  --   --   --   ALKPHOS  --  51 54  --   --   --   BILITOT  --  0.5 0.7  --   --   --    ------------------------------------------------------------------------------------------------------------------ No results for input(s): CHOL, HDL, LDLCALC, TRIG, CHOLHDL, LDLDIRECT in the last 72 hours.  Lab Results  Component Value Date   HGBA1C 5.4 03/15/2020   ------------------------------------------------------------------------------------------------------------------ No results for input(s): TSH, T4TOTAL, T3FREE, THYROIDAB in the last 72 hours.  Invalid input(s): FREET3 ------------------------------------------------------------------------------------------------------------------ No results for input(s): VITAMINB12, FOLATE, FERRITIN, TIBC, IRON, RETICCTPCT in the last 72 hours.  Coagulation profile No results for input(s): INR, PROTIME in the last 168 hours.  No results for input(s): DDIMER in the last 72 hours.  Cardiac Enzymes No results for input(s): CKMB, TROPONINI, MYOGLOBIN in the last 168 hours.  Invalid input(s): CK ------------------------------------------------------------------------------------------------------------------    Component Value Date/Time   BNP 64.6 03/29/2020 0515    Micro Results Recent Results (from the past 240 hour(s))  Culture, respiratory (non-expectorated)     Status: None   Collection Time: 04/02/20  3:36 PM   Specimen: Tracheal Aspirate; Respiratory  Result Value Ref Range Status   Specimen Description   Final    TRACHEAL ASPIRATE Performed at [facility] [address], [place], [place] [address]    Special Requests   Final    NONE Performed at [facility],  [place] [address] [place], [place] [address]    Gram Stain   Final    RARE WBC PRESENT, PREDOMINANTLY PMN RARE GRAM POSITIVE COCCI RARE GRAM NEGATIVE RODS Performed at [facility], [place] [address] [place], [place] [address]    Culture MODERATE SERRATIA MARCESCENS  Final   Report Status 04/05/2020 FINAL  Final   Organism ID, Bacteria SERRATIA MARCESCENS  Final      Susceptibility   Serratia marcescens - MIC*    CEFAZOLIN >=64 RESISTANT Resistant     CEFEPIME <=0.12 SENSITIVE Sensitive     CEFTAZIDIME <=1 SENSITIVE Sensitive     CEFTRIAXONE <=0.25 SENSITIVE Sensitive     CIPROFLOXACIN <=0.25 SENSITIVE Sensitive     GENTAMICIN <=1 SENSITIVE Sensitive     TRIMETH/SULFA <=20 SENSITIVE Sensitive     * MODERATE SERRATIA MARCESCENS  Culture, blood (routine x 2)     Status: None (Preliminary result)   Collection Time: 04/09/20  8:23 AM   Specimen: BLOOD  Result Value Ref Range Status   Specimen Description   Final    BLOOD LEFT ANTECUBITAL Performed at [facility] [address], [place], [place] [address]    Special Requests   Final    BOTTLES DRAWN AEROBIC AND ANAEROBIC Blood Culture adequate volume Performed at [facility] [address] [place], [place] [address]    Culture   Final  NO GROWTH 3 DAYS Performed at [facility], [place] [address] [place], [place] [address]    Report Status PENDING  Incomplete  Culture, blood (routine x 2)     Status: None (Preliminary result)   Collection Time: 04/09/20  8:30 AM   Specimen: BLOOD  Result Value Ref Range Status   Specimen Description   Final    BLOOD LEFT HAND Performed at [facility] [address], [place], [place] [address]    Special Requests   Final    BOTTLES DRAWN AEROBIC AND ANAEROBIC Blood Culture adequate volume Performed at [facility] [address], [place], [place] [address]    Culture   Final    NO GROWTH 3  DAYS Performed at [facility], [place] [address], [place], [place] [address]    Report Status PENDING  Incomplete  Culture, respiratory (non-expectorated)     Status: None   Collection Time: 04/09/20  3:26 PM   Specimen: Tracheal Aspirate; Respiratory  Result Value Ref Range Status   Specimen Description   Final    TRACHEAL ASPIRATE Performed at [facility] [address] [place], [place] [address]    Special Requests   Final    NONE Performed at [facility], [place] [address], [place], [place] [address]    Gram Stain   Final    NO WBC SEEN RARE GRAM POSITIVE COCCI RARE GRAM NEGATIVE RODS Performed at [facility], [place] [address], [place], [place] [address]    Culture MODERATE SERRATIA MARCESCENS  Final   Report Status 04/12/2020 FINAL  Final   Organism ID, Bacteria SERRATIA MARCESCENS  Final      Susceptibility   Serratia marcescens - MIC*    CEFAZOLIN >=64 RESISTANT Resistant     CEFEPIME <=0.12 SENSITIVE Sensitive     CEFTAZIDIME <=1 SENSITIVE Sensitive     CEFTRIAXONE <=0.25 SENSITIVE Sensitive     CIPROFLOXACIN <=0.25 SENSITIVE Sensitive     GENTAMICIN <=1 SENSITIVE Sensitive     TRIMETH/SULFA <=20 SENSITIVE Sensitive     * MODERATE SERRATIA MARCESCENS    Radiology Reports CT ABDOMEN PELVIS WO CONTRAST  Result Date: 04/09/2020 CLINICAL DATA:  Intermittent lower quadrant abdominal pain, diminished bowel movements over past 3 days, fever, history COPD, hypertension, hiatal hernia EXAM: CT ABDOMEN AND PELVIS WITHOUT CONTRAST TECHNIQUE: Multidetector CT imaging of the abdomen and pelvis was performed following the standard protocol without IV contrast. Sagittal and coronal MPR images reconstructed from axial data set. COMPARISON:  03/15/2020 FINDINGS: Lower chest: Bibasilar consolidation, volume loss and pleural effusions Hepatobiliary: Gallbladder surgically absent. Liver normal appearance Pancreas: Atrophic, otherwise  unremarkable Spleen: Normal appearance Adrenals/Urinary Tract: LEFT renal cysts unchanged. Adrenal glands, kidneys, ureters, and bladder otherwise normal appearance Stomach/Bowel: Scattered radiopacities within stool. Normal appendix. Gastrostomy tube in stomach. Mild diverticulosis of distal descending and proximal sigmoid colon without evidence of diverticulitis. Significant rectal wall thickening with surrounding perirectal infiltrative changes extending into presacral space favor proctitis though tumor not excluded. Prior ventral hernia repair with recurrent hernia identified at the inferior margin of the repair containing a nonobstructed small bowel loop (see coronal images 81-82). Vascular/Lymphatic: Atherosclerotic calcifications aorta and iliac arteries without aneurysm. No adenopathy. Reproductive: Unremarkable uterus and adnexa Other: Infiltration of fat within small bowel mesentery consistent with fibrosing mesenteritis. No free air or free fluid. Musculoskeletal: Osseous demineralization. IMPRESSION: Distal colonic diverticulosis without evidence of diverticulitis. Significant rectal wall thickening with surrounding  perirectal infiltrative changes extending into presacral space favor proctitis though tumor not excluded; recommend correlation with proctoscopy. Prior ventral hernia repair with recurrent hernia at the inferior margin of the repair, containing a nonobstructed small bowel loop. Changes of fibrosing mesenteritis. Bibasilar consolidation, volume loss and pleural effusions. Aortic Atherosclerosis (ICD10-I70.0). Electronically Signed   By: Lavonia Dana M.D.   On: 04/09/2020 13:27   IR GASTROSTOMY TUBE MOD SED  Result Date: 03/22/2020 INDICATION: Head and neck malignancy, dysphagia EXAM: FLUOROSCOPIC 16 FRENCH BALLOON RETENTION GASTROSTOMY MEDICATIONS: 1 G VANCOMYCIN; Antibiotics were administered within 1 hour of the procedure. GLUCAGON 0.5 MG IV ANESTHESIA/SEDATION: Versed 0 mg IV; Fentanyl  50 mcg IV Moderate Sedation Time:  12 MINUTES The patient was continuously monitored during the procedure by the interventional radiology nurse under my direct supervision. CONTRAST:  10 CC-administered into the gastric lumen. FLUOROSCOPY TIME:  Fluoroscopy Time: 1 minutes 36 seconds (53 mGy). COMPLICATIONS: None immediate. PROCEDURE: Informed written consent was obtained from the patient after a thorough discussion of the procedural risks, benefits and alternatives. All questions were addressed. Maximal Sterile Barrier Technique was utilized including caps, mask, sterile gowns, sterile gloves, sterile drape, hand hygiene and skin antiseptic. A timeout was performed prior to the initiation of the procedure. Previous imaging reviewed. Patient has an existing feeding tube within the stomach. This was utilized to insufflate the stomach with air. The stomach was localized beneath the left subcostal margin in the left abdomen with biplane fluoroscopy. Overlying skin marked. Under sterile conditions and local anesthesia, introducer needle was advanced into the stomach. Contrast injection confirms percutaneous needle access within the stomach. Single T tack deployed for gastropexy. Amplatz guidewire inserted. Tract dilatation performed to insert the 16 French balloon retention gastrostomy. Balloon tip inflated with 7 cc mixture of saline and 1 cc contrast. This was retracted against the anterior gastric wall. Contrast injection confirms position in the stomach. Images obtained for documentation. T tacks secured to the skin site. A sterile dressing applied. No immediate complication. Patient tolerated the procedure well. IMPRESSION: Successful fluoroscopic 52 French balloon retention gastrostomy Electronically Signed   By: Jerilynn Mages.  Shick M.D.   On: 03/22/2020 16:53   CT CHEST ABDOMEN PELVIS W CONTRAST  Result Date: 03/15/2020 CLINICAL DATA:  New diagnosis of head neck mass EXAM: CT CHEST, ABDOMEN, AND PELVIS WITH CONTRAST  TECHNIQUE: Multidetector CT imaging of the chest, abdomen and pelvis was performed following the standard protocol during bolus administration of intravenous contrast. CONTRAST:  163mL OMNIPAQUE IOHEXOL 300 MG/ML  SOLN COMPARISON:  Prior swallowing evaluation and CT of the neck. FINDINGS: CT CHEST FINDINGS Cardiovascular: Calcified coronary artery disease. Aorta is normal caliber. 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 Central pulmonary vasculature is mildly engorged. Approximately 3 cm greatest caliber unchanged from previous exam. Unremarkable on limited venous phase assessment. Mediastinum/Nodes: Supraglottic mass partially imaged on the first image of the data set. Interval placement of tracheostomy tube with resultant pneumomediastinum in the anterior mediastinum in upper mediastinum. No adenopathy within the mediastinum or axilla. No hilar adenopathy. Rounded partial necrotic lymph node in the LEFT neck partially visualized, corresponding to level IIIb lymph node seen on the neck CT. Lungs/Pleura: Basilar consolidative changes bilaterally with similar pattern of consolidation though decreased volume loss when compared to the study of September of 2020. Airways are patent. Musculoskeletal: See below for full musculoskeletal detail. CT ABDOMEN PELVIS FINDINGS Hepatobiliary: Liver without focal lesion. Post cholecystectomy. No biliary duct dilation. Pancreas: Mild atrophy of the  pancreas without focal lesion, ductal dilation or inflammation. Spleen: Spleen normal in size and contour. Adrenals/Urinary Tract: Adrenal glands are normal. Symmetric renal enhancement. No hydronephrosis. LEFT renal cysts, largest arising from the lower pole measuring 2.8 x 2.9 cm. Urinary bladder under distended limiting assessment. Stomach/Bowel: Gastrointestinal tract with signs of colonic diverticulosis. No acute bowel process. Rectus diastasis. Postoperative changes in the midline of the  abdomen related to prior ventral hernia repair. Mild bulging at the site of hernia repair, no herniation beyond the inserted mesh near the umbilicus. Vascular/Lymphatic: Calcified atheromatous plaque in the abdominal aorta. No aneurysmal dilation. There is no gastrohepatic or hepatoduodenal ligament lymphadenopathy. No retroperitoneal or mesenteric lymphadenopathy. No pelvic sidewall lymphadenopathy. Reproductive: No adnexal mass. Reproductive structures are unremarkable. Other: Post abdominal wall reconstruction with rectus diastasis, no frank hernia. Musculoskeletal: Spinal degenerative changes. Degenerative changes in glenohumeral joints and hips. Ununited fractures along posterior LEFT chest involving ribs 10 through 12. These appear subacute or chronic. These did not appear to be present on previous imaging from September of 2020. IMPRESSION: 1. Supraglottic mass partially imaged on the first image of the data set. No signs of metastatic disease to the chest, abdomen or pelvis. 2. Rounded partially necrotic lymph node in the LEFT neck partially visualized, corresponding to level IIIb lymph node seen on the neck CT. 3. Basilar consolidative changes bilaterally with similar pattern of consolidation though decreased volume loss when compared to the study of September of 2020. Findings may be related to aspiration. No definite lesions seen in the chest though these areas could obscure underlying lesions. 4. Postoperative changes with small amount of pneumomediastinum presumably related to recent tracheostomy tube insertion. 5. Ununited fractures along posterior LEFT chest involving ribs 10 through 12. These appear subacute or chronic. These did not appear to be present on previous imaging from September of 2020. 6. Post abdominal wall reconstruction with rectus diastasis, no frank hernia. 7. Aortic atherosclerosis. Aortic Atherosclerosis (ICD10-I70.0). Electronically Signed   By: Zetta Bills M.D.   On:  03/15/2020 10:53   DG CHEST PORT 1 VIEW  Result Date: 04/11/2020 CLINICAL DATA:  Hypoxia. EXAM: PORTABLE CHEST 1 VIEW COMPARISON:  April 08, 2020. FINDINGS: Stable cardiomediastinal silhouette. Tracheostomy tube is unchanged. No pneumothorax is noted. Stable left pleural effusion is noted with associated left basilar atelectasis or infiltrate. Right midlung subsegmental atelectasis is noted with probable small right pleural effusion. Bony thorax is unremarkable. IMPRESSION: Stable left pleural effusion with associated left basilar atelectasis or infiltrate. Right midlung subsegmental atelectasis is noted with probable small right pleural effusion. Electronically Signed   By: Marijo Conception M.D.   On: 04/11/2020 13:20   DG CHEST PORT 1 VIEW  Result Date: 04/08/2020 CLINICAL DATA:  Respiratory failure. EXAM: PORTABLE CHEST 1 VIEW COMPARISON:  April 03, 2020 FINDINGS: There is stable tracheostomy tube positioning. Mild, diffuse chronic appearing increased interstitial lung markings are seen. Mild to moderate severity atelectasis and/or infiltrate is seen within the right lung base. This is similar in severity when compared to the prior study. Marked severity left basilar atelectasis and/or infiltrate is seen and is increased in severity when compared to the prior exam. There is no evidence of a pleural effusion or pneumothorax. There is mild to moderate severity enlargement of the cardiac silhouette. Degenerative changes are seen throughout the thoracic spine. IMPRESSION: 1. Bibasilar atelectasis and/or infiltrate, left greater than right. Electronically Signed   By: Virgina Norfolk M.D.   On: 04/08/2020 16:45   DG CHEST PORT  1 VIEW  Result Date: 04/03/2020 CLINICAL DATA:  Increasing shortness of breath EXAM: PORTABLE CHEST 1 VIEW COMPARISON:  03/31/2020 FINDINGS: Tracheostomy tube is noted in satisfactory position. Cardiac shadow is enlarged but stable. Some persistent bibasilar opacities are noted  although mildly improved when compared with the prior exam. No new focal abnormality is noted. IMPRESSION: Improved aeration as described. Electronically Signed   By: Inez Catalina M.D.   On: 04/03/2020 11:59   DG CHEST PORT 1 VIEW  Result Date: 03/31/2020 CLINICAL DATA:  Pleural effusion EXAM: PORTABLE CHEST 1 VIEW COMPARISON:  03/29/2020 FINDINGS: Tracheostomy unchanged. Lung volumes are small, however, pulmonary insufflation remains stable since prior examination. Bilateral pleural effusions are present small and decreased in size since prior examination. Ovoid opacity within the right mid lung zone likely represents fluid within the fissure. There is bibasilar atelectasis. Cardiac size is within normal limits. No acute bone abnormality. IMPRESSION: Slight interval decrease in small bilateral pleural effusions with associated bibasilar atelectasis. Pulmonary hypoinflation. Electronically Signed   By: Fidela Salisbury MD   On: 03/31/2020 05:41   DG CHEST PORT 1 VIEW  Result Date: 03/29/2020 CLINICAL DATA:  Dyspnea EXAM: PORTABLE CHEST 1 VIEW COMPARISON:  03/25/2020 FINDINGS: Tracheostomy unchanged. Pulmonary insufflation is symmetric though has decreased slightly since prior examination. Small bilateral pleural effusions have developed with associated bibasilar atelectasis. No pneumothorax. Cardiac size within normal limits. Pulmonary vascularity is normal. No acute bone abnormality. IMPRESSION: Interval development of small bilateral pleural effusions with bibasilar compressive atelectasis. Electronically Signed   By: Fidela Salisbury MD   On: 03/29/2020 06:00   DG CHEST PORT 1 VIEW  Result Date: 03/25/2020 CLINICAL DATA:  Hypoxia, shortness of breath, altered mental status history COPD, hypertension, history laryngeal carcinoma, tracheostomy, undergoing radiation therapy EXAM: PORTABLE CHEST 1 VIEW COMPARISON:  Portable exam 2542 hours compared to 03/23/2020 FINDINGS: Tracheostomy tube projects over  tracheal air column. Normal heart size, mediastinal contours, and pulmonary vascularity. Improving opacity at LEFT lung base which could represent atelectasis or consolidation. Subsegmental atelectasis RIGHT middle lobe slightly improved. Tiny LEFT pleural effusion. Remaining lungs clear. No pneumothorax. Bones demineralized. IMPRESSION: Persistent subsegmental atelectasis at RIGHT base with improving atelectasis versus consolidation and tiny pleural effusion at LEFT base. Electronically Signed   By: Lavonia Dana M.D.   On: 03/25/2020 10:33   DG CHEST PORT 1 VIEW  Result Date: 03/23/2020 CLINICAL DATA:  Hypoxia. EXAM: PORTABLE CHEST 1 VIEW COMPARISON:  Left 19 2021. FINDINGS: Interim removal of feeding tube. Tracheostomy tube in stable position. Cardiomegaly. Persistent bibasilar atelectasis/infiltrates and small bilateral pleural effusions. Density noted over the right mid chest may represent atelectasis and or pleural fluid pseudotumor, no interim change. IMPRESSION: 1. Interim removal of feeding tube. Tracheostomy tube in stable position. 2. Stable cardiomegaly. 3. Persistent bibasilar atelectasis/infiltrates and small bilateral pleural effusions. Density of the right mid chest may represent atelectasis and or pleural fluid pseudotumor, no interim change. Electronically Signed   By: Marcello Moores  Register   On: 03/23/2020 06:39   DG CHEST PORT 1 VIEW  Result Date: 03/19/2020 CLINICAL DATA:  Pneumonia. EXAM: PORTABLE CHEST 1 VIEW COMPARISON:  03/18/2020. FINDINGS: Tracheostomy tube, feeding tube in stable position. Stable cardiomegaly. Improved pulmonary venous congestion. Lung volumes. Persistent left base atelectasis/infiltrate. Persistent right base subsegmental atelectasis. Elliptical density noted over the right mid chest may represent atelectasis and or fissural fluid. Pleural effusions cannot be excluded. No pneumothorax. IMPRESSION: 1. Lines and tubes in stable position. 2. Stable cardiomegaly. Improved  pulmonary venous  congestion. 3. Persistent left base atelectasis/infiltrate. Persistent right base subsegmental atelectasis. Elliptical density noted over the right mid chest may represent atelectasis and or fissural fluid. Small pleural effusions cannot be excluded. Electronically Signed   By: Marcello Moores  Register   On: 03/19/2020 06:03   DG Chest Port 1 View  Result Date: 03/18/2020 CLINICAL DATA:  Respiratory failure. EXAM: PORTABLE CHEST 1 VIEW COMPARISON:  03/17/2020. FINDINGS: Tracheostomy tube and feeding tube in stable position. Cardiomegaly. Mild pulmonary venous congestion. Mild bilateral interstitial prominence. Mild component of CHF cannot be excluded. Persistent left lower lobe atelectasis/infiltrate. New onset right mid lung prominent atelectatic changes. No pneumothorax. IMPRESSION: 1. Tracheostomy tube and feeding tube in stable position. 2. Cardiomegaly with mild pulmonary venous congestion and mild bilateral interstitial prominence. Mild component of CHF cannot be excluded. 3. Persistent left lower lobe atelectasis/infiltrate. New onset of right mid lung prominent atelectatic changes. Electronically Signed   By: Marcello Moores  Register   On: 03/18/2020 07:34   DG CHEST PORT 1 VIEW  Result Date: 03/17/2020 CLINICAL DATA:  Respiratory failure EXAM: PORTABLE CHEST 1 VIEW COMPARISON:  March 14, 2020 FINDINGS: Tracheostomy catheter tip is 6.2 cm above the carina. Feeding tube tip is below the diaphragm. No pneumothorax. There is a small left pleural effusion with consolidation in the left lower lobe. There is right base atelectasis with equivocal small right pleural effusion. Heart is mildly enlarged, stable, with pulmonary vascularity normal. No adenopathy. No bone lesions. IMPRESSION: Tube positions as described without pneumothorax. Left pleural effusion with equivocal right pleural effusion. Airspace opacity consistent with combination of atelectasis and pneumonia left lower lobe. Mild atelectasis  right base. Stable cardiac prominence. Electronically Signed   By: Lowella Grip III M.D.   On: 03/17/2020 09:02   DG CHEST PORT 1 VIEW  Result Date: 03/14/2020 CLINICAL DATA:  Respiratory failure with hypercapnia EXAM: PORTABLE CHEST 1 VIEW COMPARISON:  March 10, 2020 FINDINGS: Tracheostomy catheter tip is 6.2 cm above the carina. There is ill-defined airspace opacity in the left upper lobe and left base regions with equivocal left pleural effusion. The right lung is clear. Heart is upper normal in size with pulmonary vascularity normal. No adenopathy. No bone lesions. IMPRESSION: Tracheostomy as described without pneumothorax. Patchy airspace opacity consistent with scattered areas of pneumonia in the left upper lobe and left base. Equivocal left pleural effusion. Right lung clear. Stable cardiac silhouette. Electronically Signed   By: Lowella Grip III M.D.   On: 03/14/2020 09:17   DG Abd Portable 1V  Result Date: 04/07/2020 CLINICAL DATA:  Abdomen pain EXAM: PORTABLE ABDOMEN - 1 VIEW COMPARISON:  CT 03/15/2020 FINDINGS: Gastrostomy tube in the left mid abdomen. Nonobstructed bowel-gas pattern. Evidence of prior hernia repair. Curvilinear densities in the right lower quadrant, possible calcifications or retained contrast within appendix. IMPRESSION: Nonobstructed gas pattern. Electronically Signed   By: Donavan Foil M.D.   On: 04/07/2020 21:16   DG Swallowing Func-Speech Pathology  Result Date: 03/27/2020 Objective Swallowing Evaluation: Type of Study: MBS-Modified Barium Swallow Study  Patient Details Name: CENA BRUHN MRN: [id number] Date of Birth: 1960/05/06 Today's Date: 03/27/2020 Time: SLP Start Time (ACUTE ONLY): 1884 -SLP Stop Time (ACUTE ONLY): 1235 SLP Time Calculation (min) (ACUTE ONLY): 20 min Past Medical History: Past Medical History: Diagnosis Date . Bronchitis  . Class 3 obesity 01/23/2020 . COPD (chronic obstructive pulmonary disease) ([place])  . Degenerative disc disease,  lumbar  . Hiatal hernia  . Hypertension  Past Surgical History: Past Surgical History: Procedure  Laterality Date . CHOLECYSTECTOMY   . degenerative bone disease   . DIRECT LARYNGOSCOPY N/A 03/13/2020  Procedure: DIRECT LARYNGOSCOPY WITH BIOPSY;  Surgeon: Jason Coop, DO;  Location: [place];  Service: ENT;  Laterality: N/A; . INCISIONAL HERNIA REPAIR N/A 01/15/2019  Procedure: [name] HERNIORRHAPHY WITH MESH;  Surgeon: [name], MD;  Location: [facility];  Service: General;  Laterality: N/A; . IR GASTROSTOMY TUBE MOD SED  03/22/2020 . OMENTECTOMY N/A 01/15/2019  Procedure: OMENTECTOMY;  Surgeon: [name], MD;  Location: [facility];  Service: General;  Laterality: N/A; . TRACHEOSTOMY TUBE PLACEMENT N/A 03/13/2020  Procedure: AWAKE TRACHEOSTOMY;  Surgeon: Jason Coop, DO;  Location: [facility];  Service: ENT;  Laterality: N/A; HPI: Aamari West is a 59 y/o F with history of COPD,chronic respiratory failure on home oxygen at 5 L/min, with large laryngeal mass lesion, now s/p tracheostomy with Shiley 6-0 and direct laryngoscopy and biopsy. 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  Pt is also s/p PEG for nutrition.  Plans are for chemo and radiation concurrently starting while in hospital.  Prior medical history includes smoking and some dyspnea if walking long distanced.  Subjective: pt awake in chair Assessment / Plan / Recommendation CHL IP CLINICAL IMPRESSIONS 03/27/2020 Clinical Impression Pt continues with with mild pharyngeal phase dysphagia - obstructive due to mass impacting epiglottic deflection.  In addition, she requires extra time to masticate solids due to dentition.  Swallow trigger is timely with minimal laryngeal penetration of liquid into larynx during the swallow due to decreased epiglottic deflection.  Min vallecular residue with solids without pt awareness - liquid wash assisted to clear.  Pt with prominent cricopharyngeus and appears with trace backflow near this  region but view was suboptimal.  Recommend D3/mech soft and thin liquids, po medications whole in puree and follow with liquid wash.  Of note, following testing, pt did cough and slight whitish coating noted on secretions but no aspiration noted during testing.  Suspect pt has some low grade chronic secretion aspiration. SLP Visit Diagnosis Dysphagia, oropharyngeal phase (R13.12) Attention and concentration deficit following -- Frontal lobe and executive function deficit following -- Impact on safety and function Mild aspiration risk   CHL IP TREATMENT RECOMMENDATION 03/27/2020 Treatment Recommendations Therapy as outlined in treatment plan below   Prognosis 03/27/2020 Prognosis for Safe Diet Advancement Fair Barriers to Reach Goals Other (Comment) Barriers/Prognosis Comment -- CHL IP DIET RECOMMENDATION 03/27/2020 SLP Diet Recommendations Dysphagia 3 (Mech soft) solids;Thin liquid Liquid Administration via -- Medication Administration Whole meds with puree Compensations Slow rate;Small sips/bites Postural Changes --   CHL IP OTHER RECOMMENDATIONS 03/27/2020 Recommended Consults (No Data) Oral Care Recommendations Oral care BID Other Recommendations Clarify dietary restrictions   CHL IP FOLLOW UP RECOMMENDATIONS 03/27/2020 Follow up Recommendations Skilled Nursing facility   [facility] IP FREQUENCY AND DURATION 03/27/2020 Speech Therapy Frequency (ACUTE ONLY) min 1 x/week Treatment Duration 1 week      CHL IP ORAL PHASE 03/27/2020 Oral Phase WFL Oral - Pudding Teaspoon -- Oral - Pudding Cup -- Oral - Honey Teaspoon -- Oral - Honey Cup -- Oral - Nectar Teaspoon -- Oral - Nectar Cup -- Oral - Nectar Straw -- Oral - Thin Teaspoon -- Oral - Thin Cup -- Oral - Thin Straw -- Oral - Puree -- Oral - Mech Soft -- Oral - Regular -- Oral - Multi-Consistency -- Oral - Pill -- Oral Phase - Comment --  CHL IP PHARYNGEAL PHASE 03/27/2020 Pharyngeal Phase -- Pharyngeal- Pudding Teaspoon --  Pharyngeal -- Pharyngeal- Pudding Cup --  Pharyngeal -- Pharyngeal- Honey Teaspoon -- Pharyngeal -- Pharyngeal- Honey Cup -- Pharyngeal -- Pharyngeal- Nectar Teaspoon -- Pharyngeal -- Pharyngeal- Nectar Cup -- Pharyngeal -- Pharyngeal- Nectar Straw Reduced epiglottic inversion;Pharyngeal residue - valleculae Pharyngeal -- Pharyngeal- Thin Teaspoon Reduced epiglottic inversion;Pharyngeal residue - valleculae Pharyngeal -- Pharyngeal- Thin Cup Pharyngeal residue - valleculae;Reduced epiglottic inversion;Penetration/Aspiration during swallow Pharyngeal Material enters airway, remains ABOVE vocal cords then ejected out Pharyngeal- Thin Straw Reduced epiglottic inversion;Pharyngeal residue - valleculae;Penetration/Aspiration during swallow Pharyngeal Material enters airway, remains ABOVE vocal cords then ejected out Pharyngeal- Puree WFL Pharyngeal -- Pharyngeal- Mechanical Soft -- Pharyngeal -- Pharyngeal- Regular Reduced epiglottic inversion Pharyngeal -- Pharyngeal- Multi-consistency -- Pharyngeal -- Pharyngeal- Pill NT Pharyngeal -- Pharyngeal Comment --  CHL IP CERVICAL ESOPHAGEAL PHASE 03/27/2020 Cervical Esophageal Phase -- Pudding Teaspoon -- Pudding Cup -- Honey Teaspoon -- Honey Cup -- Nectar Teaspoon -- Nectar Cup -- Nectar Straw -- Thin Teaspoon -- Thin Cup Prominent cricopharyngeal segment Thin Straw -- Puree -- Mechanical Soft -- Regular -- Multi-consistency -- Pill -- Cervical Esophageal Comment appearance of potential minimal backflow of liquid near UES region = suboptimal view - did not backflow into pharynx/larynx [name], MS [facility] SLP Acute Rehab Services Office [phone] Pager [phone] [name] 03/27/2020, 2:31 PM                Time Spent in minutes  30     Desiree Hane M.D on 04/12/2020 at 12:26 PM  To page go to [email or web address] - password [facility]

## 2020-04-12 NOTE — Progress Notes (Signed)
PT denies need for trach suctioning at this time. 

## 2020-04-12 NOTE — Anesthesia Preprocedure Evaluation (Addendum)
Anesthesia Evaluation  Patient identified by MRN, date of birth, ID band Patient awake    Reviewed: Allergy & Precautions, NPO status , Patient's Chart, lab work & pertinent test results  History of Anesthesia Complications Negative for: history of anesthetic complications  Airway Mallampati: Trach       Dental   Pulmonary COPD (chronic respiratory failure; recent COPD exacerbation),  oxygen dependent, Current Smoker,    Pulmonary exam normal        Cardiovascular hypertension, negative cardio ROS Normal cardiovascular exam     Neuro/Psych negative neurological ROS  negative psych ROS   GI/Hepatic Neg liver ROS, hiatal hernia, proctitis   Endo/Other  negative endocrine ROS  Renal/GU negative Renal ROS  negative genitourinary   Musculoskeletal negative musculoskeletal ROS (+)   Abdominal   Peds  Hematology negative hematology ROS (+)   Anesthesia Other Findings  Laryngeal tumor s/p tracheostomy  Reproductive/Obstetrics                            Anesthesia Physical Anesthesia Plan  ASA: IV  Anesthesia Plan: MAC   Post-op Pain Management:    Induction: Intravenous  PONV Risk Score and Plan: 2 and Propofol infusion, TIVA and Treatment may vary due to age or medical condition  Airway Management Planned: Natural Airway, Nasal Cannula and Simple Face Mask  Additional Equipment: None  Intra-op Plan:   Post-operative Plan:   Informed Consent: I have reviewed the patients History and Physical, chart, labs and discussed the procedure including the risks, benefits and alternatives for the proposed anesthesia with the patient or authorized representative who has indicated his/her understanding and acceptance.       Plan Discussed with:   Anesthesia Plan Comments:         Anesthesia Quick Evaluation

## 2020-04-12 NOTE — Progress Notes (Signed)
SLP Cancellation Note  Patient Details Name: Veronica Horton MRN: [id number] DOB: 05-18-1960   Cancelled treatment:       Reason Eval/Treat Not Completed: Other (comment) (pt npo for procedure with GI per notes, will continue efforts for dysphagia management, swallowing exercise implementation)   [name] 04/12/2020, 8:15 AM  [name], MS [facility] SLP Acute Rehab Services Office [phone] Pager [phone]

## 2020-04-12 NOTE — Progress Notes (Signed)
RN states she just suctioned PT trach. PT denies need for additional trach suctioning at this time.

## 2020-04-12 NOTE — Progress Notes (Addendum)
HEMATOLOGY-ONCOLOGY PROGRESS NOTE  SUBJECTIVE:   The patient reports ongoing abdominal pain this morning. She is scheduled to have a sigmoidoscopy later today. Currently receiving bowel regimen. Having a lot of thick secretions from her tracheostomy. No change in her breathing. Having some pain in her mouth but able to eat and drink. No bleeding reported. No recurrent fevers.  Rest of the pertinent 10 point ROS reviewed and negative  I have reviewed the past medical history, past surgical history, social history and family history with the patient and they are unchanged from previous note.   PHYSICAL EXAMINATION: ECOG PERFORMANCE STATUS: 2 - Symptomatic, <50% confined to bed  Vitals:   04/12/20 0736 04/12/20 1000  BP:    Pulse:    Resp:    Temp:    SpO2: (!) 89% 100%   Filed Weights   04/10/20 0513 04/11/20 0500 04/12/20 0500  Weight: 126.3 kg 118.6 kg 104.7 kg    Intake/Output from previous day: 12/12 0701 - 12/13 0700 In: 900.9 [P.O.:120; I.V.:143.9; NG/GT:237; IV Piggyback:200] Out: 900 [Urine:900]  GENERAL: alert, no distress and comfortable, on trach collar Neck: Significant decrease in the LN  Ant lung fields clear. Trach site with clear mucus Abdomen. PEG site clean ,tenderness noted in the lower abdomen. BS normal. She was however holding her abdomen when she was coughing. BLE venous stasis , no change.  LABORATORY DATA:  I have reviewed the data as listed CMP Latest Ref Rng & Units 04/12/2020 04/11/2020 04/10/2020  Glucose 70 - 99 mg/dL 91 91 101(H)  BUN 6 - 20 mg/dL 21(H) 21(H) 23(H)  Creatinine 0.44 - 1.00 mg/dL 1.17(H) 1.14(H) 1.21(H)  Sodium 135 - 145 mmol/L 132(L) 135 134(L)  Potassium 3.5 - 5.1 mmol/L 4.2 4.2 4.4  Chloride 98 - 111 mmol/L 97(L) 99 99  CO2 22 - 32 mmol/L 27 26 28   Calcium 8.9 - 10.3 mg/dL 7.8(L) 8.3(L) 8.0(L)  Total Protein 6.5 - 8.1 g/dL - - -  Total Bilirubin 0.3 - 1.2 mg/dL - - -  Alkaline Phos 38 - 126 U/L - - -  AST 15 -  41 U/L - - -  ALT 0 - 44 U/L - - -    Lab Results  Component Value Date   WBC 5.9 04/12/2020   HGB 10.3 (L) 04/12/2020   HCT 30.7 (L) 04/12/2020   MCV 95.9 04/12/2020   PLT 189 04/12/2020   NEUTROABS 5.7 04/07/2020    CT ABDOMEN PELVIS WO CONTRAST  Result Date: 04/09/2020 CLINICAL DATA:  Intermittent lower quadrant abdominal pain, diminished bowel movements over past 3 days, fever, history COPD, hypertension, hiatal hernia EXAM: CT ABDOMEN AND PELVIS WITHOUT CONTRAST TECHNIQUE: Multidetector CT imaging of the abdomen and pelvis was performed following the standard protocol without IV contrast. Sagittal and coronal MPR images reconstructed from axial data set. COMPARISON:  03/15/2020 FINDINGS: Lower chest: Bibasilar consolidation, volume loss and pleural effusions Hepatobiliary: Gallbladder surgically absent. Liver normal appearance Pancreas: Atrophic, otherwise unremarkable Spleen: Normal appearance Adrenals/Urinary Tract: LEFT renal cysts unchanged. Adrenal glands, kidneys, ureters, and bladder otherwise normal appearance Stomach/Bowel: Scattered radiopacities within stool. Normal appendix. Gastrostomy tube in stomach. Mild diverticulosis of distal descending and proximal sigmoid colon without evidence of diverticulitis. Significant rectal wall thickening with surrounding perirectal infiltrative changes extending into presacral space favor proctitis though tumor not excluded. Prior ventral hernia repair with recurrent hernia identified at the inferior margin of the repair containing a nonobstructed small bowel loop (see coronal images 81-82). Vascular/Lymphatic: Atherosclerotic calcifications aorta  and iliac arteries without aneurysm. No adenopathy. Reproductive: Unremarkable uterus and adnexa Other: Infiltration of fat within small bowel mesentery consistent with fibrosing mesenteritis. No free air or free fluid. Musculoskeletal: Osseous demineralization. IMPRESSION: Distal colonic diverticulosis  without evidence of diverticulitis. Significant rectal wall thickening with surrounding perirectal infiltrative changes extending into presacral space favor proctitis though tumor not excluded; recommend correlation with proctoscopy. Prior ventral hernia repair with recurrent hernia at the inferior margin of the repair, containing a nonobstructed small bowel loop. Changes of fibrosing mesenteritis. Bibasilar consolidation, volume loss and pleural effusions. Aortic Atherosclerosis (ICD10-I70.0). Electronically Signed   By: Lavonia Dana M.D.   On: 04/09/2020 13:27   IR GASTROSTOMY TUBE MOD SED  Result Date: 03/22/2020 INDICATION: Head and neck malignancy, dysphagia EXAM: FLUOROSCOPIC 16 FRENCH BALLOON RETENTION GASTROSTOMY MEDICATIONS: 1 G VANCOMYCIN; Antibiotics were administered within 1 hour of the procedure. GLUCAGON 0.5 MG IV ANESTHESIA/SEDATION: Versed 0 mg IV; Fentanyl 50 mcg IV Moderate Sedation Time:  12 MINUTES The patient was continuously monitored during the procedure by the interventional radiology nurse under my direct supervision. CONTRAST:  10 CC-administered into the gastric lumen. FLUOROSCOPY TIME:  Fluoroscopy Time: 1 minutes 36 seconds (53 mGy). COMPLICATIONS: None immediate. PROCEDURE: Informed written consent was obtained from the patient after a thorough discussion of the procedural risks, benefits and alternatives. All questions were addressed. Maximal Sterile Barrier Technique was utilized including caps, mask, sterile gowns, sterile gloves, sterile drape, hand hygiene and skin antiseptic. A timeout was performed prior to the initiation of the procedure. Previous imaging reviewed. Patient has an existing feeding tube within the stomach. This was utilized to insufflate the stomach with air. The stomach was localized beneath the left subcostal margin in the left abdomen with biplane fluoroscopy. Overlying skin marked. Under sterile conditions and local anesthesia, introducer needle was  advanced into the stomach. Contrast injection confirms percutaneous needle access within the stomach. Single T tack deployed for gastropexy. Amplatz guidewire inserted. Tract dilatation performed to insert the 16 French balloon retention gastrostomy. Balloon tip inflated with 7 cc mixture of saline and 1 cc contrast. This was retracted against the anterior gastric wall. Contrast injection confirms position in the stomach. Images obtained for documentation. T tacks secured to the skin site. A sterile dressing applied. No immediate complication. Patient tolerated the procedure well. IMPRESSION: Successful fluoroscopic 2 French balloon retention gastrostomy Electronically Signed   By: Jerilynn Mages.  Shick M.D.   On: 03/22/2020 16:53   CT CHEST ABDOMEN PELVIS W CONTRAST  Result Date: 03/15/2020 CLINICAL DATA:  New diagnosis of head neck mass EXAM: CT CHEST, ABDOMEN, AND PELVIS WITH CONTRAST TECHNIQUE: Multidetector CT imaging of the chest, abdomen and pelvis was performed following the standard protocol during bolus administration of intravenous contrast. CONTRAST:  128mL OMNIPAQUE IOHEXOL 300 MG/ML  SOLN COMPARISON:  Prior swallowing evaluation and CT of the neck. FINDINGS: CT CHEST FINDINGS Cardiovascular: Calcified coronary artery disease. Aorta is normal caliber. 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 Central pulmonary vasculature is mildly engorged. Approximately 3 cm greatest caliber unchanged from previous exam. Unremarkable on limited venous phase assessment. Mediastinum/Nodes: Supraglottic mass partially imaged on the first image of the data set. Interval placement of tracheostomy tube with resultant pneumomediastinum in the anterior mediastinum in upper mediastinum. No adenopathy within the mediastinum or axilla. No hilar adenopathy. Rounded partial necrotic lymph node in the LEFT neck partially visualized, corresponding to level IIIb lymph node seen on the neck CT.  Lungs/Pleura: Basilar consolidative changes bilaterally  with similar pattern of consolidation though decreased volume loss when compared to the study of September of 2020. Airways are patent. Musculoskeletal: See below for full musculoskeletal detail. CT ABDOMEN PELVIS FINDINGS Hepatobiliary: Liver without focal lesion. Post cholecystectomy. No biliary duct dilation. Pancreas: Mild atrophy of the pancreas without focal lesion, ductal dilation or inflammation. Spleen: Spleen normal in size and contour. Adrenals/Urinary Tract: Adrenal glands are normal. Symmetric renal enhancement. No hydronephrosis. LEFT renal cysts, largest arising from the lower pole measuring 2.8 x 2.9 cm. Urinary bladder under distended limiting assessment. Stomach/Bowel: Gastrointestinal tract with signs of colonic diverticulosis. No acute bowel process. Rectus diastasis. Postoperative changes in the midline of the abdomen related to prior ventral hernia repair. Mild bulging at the site of hernia repair, no herniation beyond the inserted mesh near the umbilicus. Vascular/Lymphatic: Calcified atheromatous plaque in the abdominal aorta. No aneurysmal dilation. There is no gastrohepatic or hepatoduodenal ligament lymphadenopathy. No retroperitoneal or mesenteric lymphadenopathy. No pelvic sidewall lymphadenopathy. Reproductive: No adnexal mass. Reproductive structures are unremarkable. Other: Post abdominal wall reconstruction with rectus diastasis, no frank hernia. Musculoskeletal: Spinal degenerative changes. Degenerative changes in glenohumeral joints and hips. Ununited fractures along posterior LEFT chest involving ribs 10 through 12. These appear subacute or chronic. These did not appear to be present on previous imaging from September of 2020. IMPRESSION: 1. Supraglottic mass partially imaged on the first image of the data set. No signs of metastatic disease to the chest, abdomen or pelvis. 2. Rounded partially necrotic lymph node in the LEFT  neck partially visualized, corresponding to level IIIb lymph node seen on the neck CT. 3. Basilar consolidative changes bilaterally with similar pattern of consolidation though decreased volume loss when compared to the study of September of 2020. Findings may be related to aspiration. No definite lesions seen in the chest though these areas could obscure underlying lesions. 4. Postoperative changes with small amount of pneumomediastinum presumably related to recent tracheostomy tube insertion. 5. Ununited fractures along posterior LEFT chest involving ribs 10 through 12. These appear subacute or chronic. These did not appear to be present on previous imaging from September of 2020. 6. Post abdominal wall reconstruction with rectus diastasis, no frank hernia. 7. Aortic atherosclerosis. Aortic Atherosclerosis (ICD10-I70.0). Electronically Signed   By: Zetta Bills M.D.   On: 03/15/2020 10:53   DG CHEST PORT 1 VIEW  Result Date: 04/11/2020 CLINICAL DATA:  Hypoxia. EXAM: PORTABLE CHEST 1 VIEW COMPARISON:  April 08, 2020. FINDINGS: Stable cardiomediastinal silhouette. Tracheostomy tube is unchanged. No pneumothorax is noted. Stable left pleural effusion is noted with associated left basilar atelectasis or infiltrate. Right midlung subsegmental atelectasis is noted with probable small right pleural effusion. Bony thorax is unremarkable. IMPRESSION: Stable left pleural effusion with associated left basilar atelectasis or infiltrate. Right midlung subsegmental atelectasis is noted with probable small right pleural effusion. Electronically Signed   By: Marijo Conception M.D.   On: 04/11/2020 13:20   DG CHEST PORT 1 VIEW  Result Date: 04/08/2020 CLINICAL DATA:  Respiratory failure. EXAM: PORTABLE CHEST 1 VIEW COMPARISON:  April 03, 2020 FINDINGS: There is stable tracheostomy tube positioning. Mild, diffuse chronic appearing increased interstitial lung markings are seen. Mild to moderate severity atelectasis  and/or infiltrate is seen within the right lung base. This is similar in severity when compared to the prior study. Marked severity left basilar atelectasis and/or infiltrate is seen and is increased in severity when compared to the prior exam. There is no evidence of a pleural effusion or pneumothorax.  There is mild to moderate severity enlargement of the cardiac silhouette. Degenerative changes are seen throughout the thoracic spine. IMPRESSION: 1. Bibasilar atelectasis and/or infiltrate, left greater than right. Electronically Signed   By: Virgina Norfolk M.D.   On: 04/08/2020 16:45   DG CHEST PORT 1 VIEW  Result Date: 04/03/2020 CLINICAL DATA:  Increasing shortness of breath EXAM: PORTABLE CHEST 1 VIEW COMPARISON:  03/31/2020 FINDINGS: Tracheostomy tube is noted in satisfactory position. Cardiac shadow is enlarged but stable. Some persistent bibasilar opacities are noted although mildly improved when compared with the prior exam. No new focal abnormality is noted. IMPRESSION: Improved aeration as described. Electronically Signed   By: Inez Catalina M.D.   On: 04/03/2020 11:59   DG CHEST PORT 1 VIEW  Result Date: 03/31/2020 CLINICAL DATA:  Pleural effusion EXAM: PORTABLE CHEST 1 VIEW COMPARISON:  03/29/2020 FINDINGS: Tracheostomy unchanged. Lung volumes are small, however, pulmonary insufflation remains stable since prior examination. Bilateral pleural effusions are present small and decreased in size since prior examination. Ovoid opacity within the right mid lung zone likely represents fluid within the fissure. There is bibasilar atelectasis. Cardiac size is within normal limits. No acute bone abnormality. IMPRESSION: Slight interval decrease in small bilateral pleural effusions with associated bibasilar atelectasis. Pulmonary hypoinflation. Electronically Signed   By: Fidela Salisbury MD   On: 03/31/2020 05:41   DG CHEST PORT 1 VIEW  Result Date: 03/29/2020 CLINICAL DATA:  Dyspnea EXAM: PORTABLE  CHEST 1 VIEW COMPARISON:  03/25/2020 FINDINGS: Tracheostomy unchanged. Pulmonary insufflation is symmetric though has decreased slightly since prior examination. Small bilateral pleural effusions have developed with associated bibasilar atelectasis. No pneumothorax. Cardiac size within normal limits. Pulmonary vascularity is normal. No acute bone abnormality. IMPRESSION: Interval development of small bilateral pleural effusions with bibasilar compressive atelectasis. Electronically Signed   By: Fidela Salisbury MD   On: 03/29/2020 06:00   DG CHEST PORT 1 VIEW  Result Date: 03/25/2020 CLINICAL DATA:  Hypoxia, shortness of breath, altered mental status history COPD, hypertension, history laryngeal carcinoma, tracheostomy, undergoing radiation therapy EXAM: PORTABLE CHEST 1 VIEW COMPARISON:  Portable exam 6384 hours compared to 03/23/2020 FINDINGS: Tracheostomy tube projects over tracheal air column. Normal heart size, mediastinal contours, and pulmonary vascularity. Improving opacity at LEFT lung base which could represent atelectasis or consolidation. Subsegmental atelectasis RIGHT middle lobe slightly improved. Tiny LEFT pleural effusion. Remaining lungs clear. No pneumothorax. Bones demineralized. IMPRESSION: Persistent subsegmental atelectasis at RIGHT base with improving atelectasis versus consolidation and tiny pleural effusion at LEFT base. Electronically Signed   By: Lavonia Dana M.D.   On: 03/25/2020 10:33   DG CHEST PORT 1 VIEW  Result Date: 03/23/2020 CLINICAL DATA:  Hypoxia. EXAM: PORTABLE CHEST 1 VIEW COMPARISON:  Left 19 2021. FINDINGS: Interim removal of feeding tube. Tracheostomy tube in stable position. Cardiomegaly. Persistent bibasilar atelectasis/infiltrates and small bilateral pleural effusions. Density noted over the right mid chest may represent atelectasis and or pleural fluid pseudotumor, no interim change. IMPRESSION: 1. Interim removal of feeding tube. Tracheostomy tube in stable  position. 2. Stable cardiomegaly. 3. Persistent bibasilar atelectasis/infiltrates and small bilateral pleural effusions. Density of the right mid chest may represent atelectasis and or pleural fluid pseudotumor, no interim change. Electronically Signed   By: Marcello Moores  Register   On: 03/23/2020 06:39   DG CHEST PORT 1 VIEW  Result Date: 03/19/2020 CLINICAL DATA:  Pneumonia. EXAM: PORTABLE CHEST 1 VIEW COMPARISON:  03/18/2020. FINDINGS: Tracheostomy tube, feeding tube in stable position. Stable cardiomegaly. Improved pulmonary venous congestion.  Lung volumes. Persistent left base atelectasis/infiltrate. Persistent right base subsegmental atelectasis. Elliptical density noted over the right mid chest may represent atelectasis and or fissural fluid. Pleural effusions cannot be excluded. No pneumothorax. IMPRESSION: 1. Lines and tubes in stable position. 2. Stable cardiomegaly. Improved pulmonary venous congestion. 3. Persistent left base atelectasis/infiltrate. Persistent right base subsegmental atelectasis. Elliptical density noted over the right mid chest may represent atelectasis and or fissural fluid. Small pleural effusions cannot be excluded. Electronically Signed   By: Marcello Moores  Register   On: 03/19/2020 06:03   DG Chest Port 1 View  Result Date: 03/18/2020 CLINICAL DATA:  Respiratory failure. EXAM: PORTABLE CHEST 1 VIEW COMPARISON:  03/17/2020. FINDINGS: Tracheostomy tube and feeding tube in stable position. Cardiomegaly. Mild pulmonary venous congestion. Mild bilateral interstitial prominence. Mild component of CHF cannot be excluded. Persistent left lower lobe atelectasis/infiltrate. New onset right mid lung prominent atelectatic changes. No pneumothorax. IMPRESSION: 1. Tracheostomy tube and feeding tube in stable position. 2. Cardiomegaly with mild pulmonary venous congestion and mild bilateral interstitial prominence. Mild component of CHF cannot be excluded. 3. Persistent left lower lobe  atelectasis/infiltrate. New onset of right mid lung prominent atelectatic changes. Electronically Signed   By: Marcello Moores  Register   On: 03/18/2020 07:34   DG CHEST PORT 1 VIEW  Result Date: 03/17/2020 CLINICAL DATA:  Respiratory failure EXAM: PORTABLE CHEST 1 VIEW COMPARISON:  March 14, 2020 FINDINGS: Tracheostomy catheter tip is 6.2 cm above the carina. Feeding tube tip is below the diaphragm. No pneumothorax. There is a small left pleural effusion with consolidation in the left lower lobe. There is right base atelectasis with equivocal small right pleural effusion. Heart is mildly enlarged, stable, with pulmonary vascularity normal. No adenopathy. No bone lesions. IMPRESSION: Tube positions as described without pneumothorax. Left pleural effusion with equivocal right pleural effusion. Airspace opacity consistent with combination of atelectasis and pneumonia left lower lobe. Mild atelectasis right base. Stable cardiac prominence. Electronically Signed   By: Lowella Grip III M.D.   On: 03/17/2020 09:02   DG CHEST PORT 1 VIEW  Result Date: 03/14/2020 CLINICAL DATA:  Respiratory failure with hypercapnia EXAM: PORTABLE CHEST 1 VIEW COMPARISON:  March 10, 2020 FINDINGS: Tracheostomy catheter tip is 6.2 cm above the carina. There is ill-defined airspace opacity in the left upper lobe and left base regions with equivocal left pleural effusion. The right lung is clear. Heart is upper normal in size with pulmonary vascularity normal. No adenopathy. No bone lesions. IMPRESSION: Tracheostomy as described without pneumothorax. Patchy airspace opacity consistent with scattered areas of pneumonia in the left upper lobe and left base. Equivocal left pleural effusion. Right lung clear. Stable cardiac silhouette. Electronically Signed   By: Lowella Grip III M.D.   On: 03/14/2020 09:17   DG Abd Portable 1V  Result Date: 04/07/2020 CLINICAL DATA:  Abdomen pain EXAM: PORTABLE ABDOMEN - 1 VIEW COMPARISON:   CT 03/15/2020 FINDINGS: Gastrostomy tube in the left mid abdomen. Nonobstructed bowel-gas pattern. Evidence of prior hernia repair. Curvilinear densities in the right lower quadrant, possible calcifications or retained contrast within appendix. IMPRESSION: Nonobstructed gas pattern. Electronically Signed   By: Donavan Foil M.D.   On: 04/07/2020 21:16   DG Swallowing Func-Speech Pathology  Result Date: 03/27/2020 Objective Swallowing Evaluation: Type of Study: MBS-Modified Barium Swallow Study  Patient Details Name: Veronica Horton MRN: [id number] Date of Birth: 10-06-1960 Today's Date: 03/27/2020 Time: SLP Start Time (ACUTE ONLY): 4098 -SLP Stop Time (ACUTE ONLY): 1191 SLP Time Calculation (min) (ACUTE  ONLY): 20 min Past Medical History: Past Medical History: Diagnosis Date . Bronchitis  . Class 3 obesity 01/23/2020 . COPD (chronic obstructive pulmonary disease) ([place])  . Degenerative disc disease, lumbar  . Hiatal hernia  . Hypertension  Past Surgical History: Past Surgical History: Procedure Laterality Date . CHOLECYSTECTOMY   . degenerative bone disease   . DIRECT LARYNGOSCOPY N/A 03/13/2020  Procedure: DIRECT LARYNGOSCOPY WITH BIOPSY;  Surgeon: Jason Coop, DO;  Location: [place];  Service: ENT;  Laterality: N/A; . INCISIONAL HERNIA REPAIR N/A 01/15/2019  Procedure: [name] HERNIORRHAPHY WITH MESH;  Surgeon: [name], MD;  Location: [facility];  Service: General;  Laterality: N/A; . IR GASTROSTOMY TUBE MOD SED  03/22/2020 . OMENTECTOMY N/A 01/15/2019  Procedure: OMENTECTOMY;  Surgeon: [name], MD;  Location: [facility];  Service: General;  Laterality: N/A; . TRACHEOSTOMY TUBE PLACEMENT N/A 03/13/2020  Procedure: AWAKE TRACHEOSTOMY;  Surgeon: Jason Coop, DO;  Location: [place];  Service: ENT;  Laterality: N/A; HPI: Veronica Horton is a 59 y/o F with history of COPD,chronic respiratory failure on home oxygen at 5 L/min, with large laryngeal mass lesion, now s/p tracheostomy with Shiley 6-0 and  direct laryngoscopy and biopsy. 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  Pt is also s/p PEG for nutrition.  Plans are for chemo and radiation concurrently starting while in hospital.  Prior medical history includes smoking and some dyspnea if walking long distanced.  Subjective: pt awake in chair Assessment / Plan / Recommendation CHL IP CLINICAL IMPRESSIONS 03/27/2020 Clinical Impression Pt continues with with mild pharyngeal phase dysphagia - obstructive due to mass impacting epiglottic deflection.  In addition, she requires extra time to masticate solids due to dentition.  Swallow trigger is timely with minimal laryngeal penetration of liquid into larynx during the swallow due to decreased epiglottic deflection.  Min vallecular residue with solids without pt awareness - liquid wash assisted to clear.  Pt with prominent cricopharyngeus and appears with trace backflow near this region but view was suboptimal.  Recommend D3/mech soft and thin liquids, po medications whole in puree and follow with liquid wash.  Of note, following testing, pt did cough and slight whitish coating noted on secretions but no aspiration noted during testing.  Suspect pt has some low grade chronic secretion aspiration. SLP Visit Diagnosis Dysphagia, oropharyngeal phase (R13.12) Attention and concentration deficit following -- Frontal lobe and executive function deficit following -- Impact on safety and function Mild aspiration risk   CHL IP TREATMENT RECOMMENDATION 03/27/2020 Treatment Recommendations Therapy as outlined in treatment plan below   Prognosis 03/27/2020 Prognosis for Safe Diet Advancement Fair Barriers to Reach Goals Other (Comment) Barriers/Prognosis Comment -- CH[facility] IP DIET RECOMMENDATION 03/27/2020 SLP Diet Recommendations Dysphagia 3 (Mech soft) solids;Thin liquid Liquid Administration via -- Medication Administration Whole meds with puree Compensations Slow rate;Small sips/bites Postural Changes --    CHL IP OTHER RECOMMENDATIONS 03/27/2020 Recommended Consults (No Data) Oral Care Recommendations Oral care BID Other Recommendations Clarify dietary restrictions   [facility] IP FOLLOW UP RECOMMENDATIONS 03/27/2020 Follow up Recommendations Skilled Nursing facility   [facility] IP FREQUENCY AND DURATION 03/27/2020 Speech Therapy Frequency (ACUTE ONLY) min 1 x/week Treatment Duration 1 week      CHL IP ORAL PHASE 03/27/2020 Oral Phase WFL Oral - Pudding Teaspoon -- Oral - Pudding Cup -- Oral - Honey Teaspoon -- Oral - Honey Cup -- Oral - Nectar Teaspoon -- Oral - Nectar Cup -- Oral - Nectar Straw -- Oral - Thin Teaspoon -- Oral -  Thin Cup -- Oral - Thin Straw -- Oral - Puree -- Oral - Mech Soft -- Oral - Regular -- Oral - Multi-Consistency -- Oral - Pill -- Oral Phase - Comment --  [facility] IP PHARYNGEAL PHASE 03/27/2020 Pharyngeal Phase -- Pharyngeal- Pudding Teaspoon -- Pharyngeal -- Pharyngeal- Pudding Cup -- Pharyngeal -- Pharyngeal- Honey Teaspoon -- Pharyngeal -- Pharyngeal- Honey Cup -- Pharyngeal -- Pharyngeal- Nectar Teaspoon -- Pharyngeal -- Pharyngeal- Nectar Cup -- Pharyngeal -- Pharyngeal- Nectar Straw Reduced epiglottic inversion;Pharyngeal residue - valleculae Pharyngeal -- Pharyngeal- Thin Teaspoon Reduced epiglottic inversion;Pharyngeal residue - valleculae Pharyngeal -- Pharyngeal- Thin Cup Pharyngeal residue - valleculae;Reduced epiglottic inversion;Penetration/Aspiration during swallow Pharyngeal Material enters airway, remains ABOVE vocal cords then ejected out Pharyngeal- Thin Straw Reduced epiglottic inversion;Pharyngeal residue - valleculae;Penetration/Aspiration during swallow Pharyngeal Material enters airway, remains ABOVE vocal cords then ejected out Pharyngeal- Puree WFL Pharyngeal -- Pharyngeal- Mechanical Soft -- Pharyngeal -- Pharyngeal- Regular Reduced epiglottic inversion Pharyngeal -- Pharyngeal- Multi-consistency -- Pharyngeal -- Pharyngeal- Pill NT Pharyngeal -- Pharyngeal Comment --  CHL IP  CERVICAL ESOPHAGEAL PHASE 03/27/2020 Cervical Esophageal Phase -- Pudding Teaspoon -- Pudding Cup -- Honey Teaspoon -- Honey Cup -- Nectar Teaspoon -- Nectar Cup -- Nectar Straw -- Thin Teaspoon -- Thin Cup Prominent cricopharyngeal segment Thin Straw -- Puree -- Mechanical Soft -- Regular -- Multi-consistency -- Pill -- Cervical Esophageal Comment appearance of potential minimal backflow of liquid near UES region = suboptimal view - did not backflow into pharynx/larynx [name], MS [facility] SLP Acute Rehab Services Office [phone] Pager [phone] [name] 03/27/2020, 2:31 PM              ASSESSMENT AND PLAN:  59 year old Caucasian female, with medical history of heavy smoking, COPD on home oxygen 5 L/min, hypertension, presents with worsening dyspnea and altered mental status.  Work-up showed a large laryngeal mass.  Patient required urgent tracheostomy.  1.  Supraglottic laryngeal cancer, [id number] 2. COPD with acute exacerbation, on chronic home oxygen 5 L/min 3. HTN  4.  Heavy smoking history 5.  Abdominal pain with proctitis noted on CT scan  Recommendations:  -She has T4aN2c supraglottic laryngeal cancer, with compromised airway, required urgent tracheostomy.  -Started concurrent CRT on 03/29/2020.  Tolerating chemotherapy well. Significant response in lymphadenopathy. -Labs reviewed.  She has no neutropenia or thrombocytopenia.  She has mild anemia which is overall stable.  BUN/creatinine remain elevated but stable.  Likely component of AKI.  We will continue to monitor this. -She has persistent abdominal pain.  Scheduled for sigmoidoscopy later today per GI.  On Cipro and Flagyl for proctitis noted on CT scan. -She has no recurrent fevers.  Blood cultures remain negative.  Sputum culture positive for Serratia marcescens -?colinization.  Serratia sensitive to Cipro. -Given ongoing abdominal pain and work-up plan by GI later today, will hold the therapy plan for today. -She  is tentatively scheduled for weekly follow-up in our clinic -we will adjust schedule as needed. -Discussed with hospitalist.  Mikey Bussing, DNP, AGPCNP-BC, AOCNP  ADDENDUM  .Patient was Personally and independently interviewed, examined and relevant elements of the history of present illness were reviewed in details and an assessment and plan was created. All elements of the patient's history of present illness , assessment and plan were discussed in details with Mikey Bussing, DNP, AGPCNP-BC, AOCNP. The above documentation reflects our combined findings assessment and plan. Holding chemotherapy scheduled today Sigmoidoscopy showed -a large rectal ulcer likely a stercoral ulcer and doubt malignancy. Biopsies taken. Benign-appearing distal  rectal polyp removed with snare cautery. GI recommended laxatives and supportive care. F/u as per scheduled appointment for her next labs, clinic visit and treatment as outpatient on 04/19/2020.   Sullivan Lone MD MS

## 2020-04-12 NOTE — Progress Notes (Signed)
PT in procedure at this time.

## 2020-04-13 ENCOUNTER — Ambulatory Visit
Admit: 2020-04-13 | Discharge: 2020-04-13 | Disposition: A | Discharge status: Home / Self Care | Payer: Medicaid Other | Attending: Radiation Oncology | Admitting: Radiation Oncology

## 2020-04-13 ENCOUNTER — Inpatient Hospital Stay (HOSPITAL_COMMUNITY): Payer: Medicaid Other

## 2020-04-13 ENCOUNTER — Encounter (HOSPITAL_COMMUNITY): Payer: Self-pay | Admitting: Gastroenterology

## 2020-04-13 ENCOUNTER — Encounter: Payer: Self-pay | Admitting: General Practice

## 2020-04-13 ENCOUNTER — Inpatient Hospital Stay: Payer: Medicaid Other

## 2020-04-13 ENCOUNTER — Other Ambulatory Visit: Payer: Self-pay | Admitting: *Deleted

## 2020-04-13 DIAGNOSIS — R935 Abnormal findings on diagnostic imaging of other abdominal regions, including retroperitoneum: Secondary | ICD-10-CM

## 2020-04-13 DIAGNOSIS — C321 Malignant neoplasm of supraglottis: Secondary | ICD-10-CM

## 2020-04-13 DIAGNOSIS — R1011 Right upper quadrant pain: Secondary | ICD-10-CM

## 2020-04-13 LAB — CBC
HCT: 32.3 % — ABNORMAL LOW (ref 36.0–46.0)
Hemoglobin: 10.4 g/dL — ABNORMAL LOW (ref 12.0–15.0)
MCH: 31.3 pg (ref 26.0–34.0)
MCHC: 32.2 g/dL (ref 30.0–36.0)
MCV: 97.3 fL (ref 80.0–100.0)
Platelets: 199 10*3/uL (ref 150–400)
RBC: 3.32 MIL/uL — ABNORMAL LOW (ref 3.87–5.11)
RDW: 14.6 % (ref 11.5–15.5)
WBC: 4.8 10*3/uL (ref 4.0–10.5)
nRBC: 0 % (ref 0.0–0.2)

## 2020-04-13 LAB — BASIC METABOLIC PANEL
Anion gap: 10 (ref 5–15)
BUN: 17 mg/dL (ref 6–20)
CO2: 26 mmol/L (ref 22–32)
Calcium: 8.2 mg/dL — ABNORMAL LOW (ref 8.9–10.3)
Chloride: 101 mmol/L (ref 98–111)
Creatinine, Ser: 1.05 mg/dL — ABNORMAL HIGH (ref 0.44–1.00)
GFR, Estimated: >60 mL/min (ref >60)
Glucose, Bld: 92 mg/dL (ref 70–99)
Potassium: 4 mmol/L (ref 3.5–5.1)
Sodium: 137 mmol/L (ref 135–145)

## 2020-04-13 LAB — HEPATIC FUNCTION PANEL
ALT: 12 U/L (ref 0–44)
AST: 13 U/L — ABNORMAL LOW (ref 15–41)
Albumin: 2.5 g/dL — ABNORMAL LOW (ref 3.5–5.0)
Alkaline Phosphatase: 45 U/L (ref 38–126)
Bilirubin, Direct: 0.1 mg/dL (ref 0.0–0.2)
Indirect Bilirubin: 0.4 mg/dL (ref 0.3–0.9)
Total Bilirubin: 0.5 mg/dL (ref 0.3–1.2)
Total Protein: 5.3 g/dL — ABNORMAL LOW (ref 6.5–8.1)

## 2020-04-13 LAB — GLUCOSE, CAPILLARY
Glucose-Capillary: 82 mg/dL (ref 70–99)
Glucose-Capillary: 87 mg/dL (ref 70–99)
Glucose-Capillary: 87 mg/dL (ref 70–99)
Glucose-Capillary: 107 mg/dL — ABNORMAL HIGH (ref 70–99)

## 2020-04-13 LAB — SURGICAL PATHOLOGY

## 2020-04-13 MED ORDER — POLYETHYLENE GLYCOL 3350 17 G PO PACK: 17.0000 g | PACK | Freq: Two times a day (BID) | ORAL | Status: DC | PRN

## 2020-04-13 MED ORDER — BOOST PLUS PO LIQD
237.0000 mL | ORAL | Status: DC
  Filled 2020-04-13 (×3): qty 237

## 2020-04-13 NOTE — Progress Notes (Signed)
[facility] Gastroenterology Progress Note  Veronica Horton 59 y.o. 1960-09-01  CC:  Lower abdominal pain, rectal thickening of CT  Subjective: Patient reports continued lower abdominal pain. Had a large liquid BM this morning without any bleeding.  Denies nausea/vomiting. Is tolerating a regular diet.  ROS : Review of Systems  Cardiovascular: Negative for chest pain and palpitations.  Gastrointestinal: Positive for abdominal pain and diarrhea. Negative for blood in stool, constipation, heartburn, melena, nausea and vomiting.    Objective: Vital signs in last 24 hours: Vitals:   04/13/20 0424 04/13/20 0739  BP:    Pulse: 72   Resp: 18   Temp:    SpO2: 96% 95%    Physical Exam:  General:  Alert, cooperative, no distress  Neck:  Trahehostomy in place  Eyes:  Anicteric sclera, EOMs intact   Lungs:   Clear to auscultation bilaterally, respirations unlabored  Heart:  Regular rate and rhythm, S1, S2 normal  Abdomen:   Soft, mild RLQ and LLQ tenderness to palpation, bowel sounds active all four quadrants,  no peritoneal signs   Extremities: Extremities normal, atraumatic, no  edema  Pulses: 2+ and symmetric    Lab Results: Recent Labs    04/12/20 0515 04/13/20 0515  NA 132* 137  K 4.2 4.0  CL 97* 101  CO2 27 26  GLUCOSE 91 92  BUN 21* 17  CREATININE 1.17* 1.05*  CALCIUM 7.8* 8.2*   No results for input(s): AST, ALT, ALKPHOS, BILITOT, PROT, ALBUMIN in the last 72 hours. Recent Labs    04/12/20 0515 04/13/20 0515  WBC 5.9 4.8  HGB 10.3* 10.4*  HCT 30.7* 32.3*  MCV 95.9 97.3  PLT 189 199   No results for input(s): LABPROT, INR in the last 72 hours.    Assessment: Lower abdominal pain, rectal thickening of CT -Flex sig yesterday revealed a large rectal ulcer, likely a stercoral ulcer and benign-appearing distal rectal polyp, biopsies pending   Plan: Avoid constipation.  Miralax 1-2 times daily PRN.    [facility] will sign off. Please contact us if we can be of any  further assistance during this hospital stay.  Salley Slaughter PA-C 04/13/2020, 10:29 AM  Contact #  [phone]

## 2020-04-13 NOTE — Progress Notes (Signed)
OT Cancellation Note  Patient Details Name: AVIGAYIL TON MRN: [id number] DOB: [birth year]   Cancelled Treatment:    Reason Eval/Treat Not Completed: Patient declined due to lunch just having arrived.  Pt was provided with energy conservation handout and pt observed reviewing the information on her own. Will continue efforts.   Julien Girt 04/13/2020, 2:26 PM

## 2020-04-13 NOTE — Progress Notes (Addendum)
[facility]  PROGRESS NOTE  Veronica Horton QQV:[id number] DOB: 1960-10-30 DOA: 03/10/2020 PCP: [facility] date - 03/10/2020   Admitting Physician Albertine Patricia, MD  Outpatient Primary MD for the patient is [facility] [place] [place] [facility] is a 59 y.o. year old female with medical history significant for COPD, chronic hypoxic respiratory failure on 5 L O2, hiatal hernia, morbid obesity who presented on 03/10/2020 with shortness of breath thus far have COPD exacerbation.  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  Has been at [facility] long since 11/17 radiation therapy, also had PEG tube placed on 11/22.  Currently receiving chemoradiation.  Underwent flexible sigmoidoscopy on 12/13 due to rectal wall thickening concerning for proctitis seen on CT abdomen and was found to have  a large rectal ulcer presumed to be stercoral ulcer in the setting of constipation.  Her IV antibiotics were discontinued given less concern for infectious proctitis.Patient's respiratory status has been optimized given chest x-ray showed atelectasis, have encouraged some spirometry with scheduled DuoNebs and nebulizers given patient cannot tolerate inhalers due to laryngeal mass as recommended by PCCM.   Subjective  Coughing but less productive, feels breathing is better.  Still has lower abdominal pain.  A & P   Lower quadrant abdominal pain, persists.  Likely from persistent constipation.  Status post flex sig revealed large rectal ulcer presumed to be stercoral ulcer in setting of constipation/proctitis.  Abdominal x-ray today shows no signs of obstruction shows moderate stool in colon.  Patient does still have some pain in right upper quadrant (status post gallbladder removal on imaging) -Discontinue IV antibiotics  given afebrile, no white count, blood cultures unremarkable, doubt infection -Decrease MiraLAX to twice daily as needed given patient also having diarrhea.  DC scheduled Colace -Continue  PPI  Loculated pleural effusion, left base.  Noted on abdominal x-ray.  Has had intermittent episodes of increase O2 requirements, has remained relatively stable on 2 L, 40 to 60% FiO2. -CT chest to evaluate pleural effusion -Tracheal culture significant for Serratia, unclear clinical significance, previously on IV Cipro for presumed infectious proctitis earlier -Continue to closely monitor  Fever in the setting of likely proctitis, resolved.  Is been afebrile for greater than 48 hours.  Blood cultures unremarkable.  Doubt infectious proctitis monitor kelechi stercoral colitis in setting of constipation as noted above.  -IV antibiotics discontinued -Continue to Monitor blood cultures -Closely monitor blood pressure, on telemetry  Supraglottic cell laryngeal cancer, status post tracheostomy and PEG placement.  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  She can not be intubated from above due to airway occlusion.  Abdominal x-ray notes concern for loculated pleural effusion at the left -Obtain CT chest for further evaluation of left pleural effusion --Garlon Hatchet and Yuperlri at discharge, she can not use inhalers due to airway occlusion -NO passy-muir valve. She will be seen in trach clinic after chemotherapy for her tumor to see if she can t olerate per PCCM --routine trach care needs to be taught to patient and family(discussed with sister who  states she can't come up to hospital but the  patient's significant other can) --I touched base with cancer center team/social work on 12/14 to see if there any resources for nursing/therapy for patient to go home with as well  Likely multifactorial etiology including COPD further exacerbated by laryngeal squamous cell carcinoma as well aspiration pneumonia.  Chest x-ray on 12/12 shows bibasilar atelectasis and right middle lobe atelectasis.  Tracheal aspirate culture growing Serratia pansensitive (only resistant to Keflex) -On size 6 uncuffed Shiley placed by PCCM on 12/2 - scheduled duo nebs to every 6, pending findings refluxate may transition to cefdinir -Still requiring 10 L via trach, previous home regimen 5 L, will need to wean to be able to be safe from a disposition standpoint to go home,  goal SPO2 greater than 88% -Encourage incentive spirometry use -Continue Pulmicort and duo nebs inhalers  AKI, improving.  Baseline creatinine 0.9-1.  Currently 1.1 suspect likely prerenal given diminished appetite related to ongoing/intermittent abdominal pain. -Avoid nephrotoxins -Monitor output -Monitor BMP  COPD, no wheezing.  Still has rhonchi on exam, chest x-ray shows atelectasis -Continue inhaler regimen - scheduled DuoNebs every 6 hours given increased rhonchi and productive cough  Hypoglycemia episodes.  G this a.m. 103.  Currently n.p.o. for flex sig  -Continue close monitor CBG on insulin regimen  GERD -continue PPI     Family Communication  : Updated Veronica Horton, sister on 12/13 by phone, will update today  Code Status : Full  Disposition Plan  :  Patient is from home. Anticipated d/c date: 2 to 3 days. Barriers to d/c or necessity for inpatient status:  Discontinue IV antibiotics, still requiring fairly high amounts of oxygen currently at 10 L trach support but has been stable--need to make sure patient/family know the education of trach care, CT chest to evaluate possible loculated pleural effusion, case management  assisting with disposition patient will likely need home health therapy at minimum Consults  : Oncology and GI  Procedures  :    DVT Prophylaxis  :  Lovenox  MDM: The below labs and imaging reports were reviewed and summarized above.  Medication management as above.  Lab Results  Component Value Date   PLT 199 04/13/2020    Diet :  Diet Order            Diet Heart Room service appropriate? Yes; Fluid consistency: Thin  Diet effective now                  Inpatient Medications Scheduled Meds: . arformoterol  15 mcg Nebulization BID  . bisacodyl  10 mg Rectal Once  . budesonide (PULMICORT) nebulizer solution  0.5 mg Nebulization BID  . chlorhexidine  15 mL Mouth Rinse BID  . docusate  100 mg Per Tube BID  . enoxaparin (LOVENOX) injection  60 mg Subcutaneous Daily  . feeding supplement  237 mL Oral TID BM  . feeding supplement (OSMOLITE 1.5 CAL)  237 mL Per Tube TID BM  . gabapentin  600 mg Oral QHS  . insulin aspart  0-9 Units Subcutaneous Q4H  . ipratropium-albuterol  3 mL Nebulization TID  . ketotifen  1 drop Both Eyes BID  . lidocaine  1 patch Transdermal Q24H  . mouth rinse  15 mL Mouth Rinse q12n4p  . melatonin  5 mg Oral QHS  . multivitamin with minerals  1 tablet Per Tube Daily  . nicotine  21 mg Transdermal Daily  . pantoprazole sodium  40 mg  Per Tube Daily  . polyethylene glycol  17 g Per Tube BID  . scopolamine  1 patch Transdermal Q72H   Continuous Infusions: . sodium chloride    . ciprofloxacin 400 mg (04/13/20 0528)   PRN Meds:.sodium chloride, acetaminophen (TYLENOL) oral liquid 160 mg/5 mL, bisacodyl, diclofenac Sodium, diphenhydrAMINE, glucagon, guaiFENesin-dextromethorphan, hydrOXYzine, ipratropium-albuterol, lidocaine (PF), magic mouthwash w/lidocaine, oxyCODONE, prochlorperazine **OR** prochlorperazine  Antibiotics  :   Anti-infectives (From admission, onward)   Start     Dose/Rate Route Frequency Ordered Stop   04/09/20 1800  ciprofloxacin  (CIPRO) IVPB 400 mg        400 mg 200 mL/hr over 60 Minutes Intravenous Every 12 hours 04/09/20 1705     04/09/20 1800  metroNIDAZOLE (FLAGYL) tablet 500 mg  Status:  Discontinued        500 mg Per Tube Every 8 hours 04/09/20 1705 04/13/20 1019   03/22/20 1700  vancomycin (VANCOCIN) IVPB 1000 mg/200 mL premix  Status:  Discontinued        1,000 mg 200 mL/hr over 60 Minutes Intravenous  Once 03/22/20 1605 03/23/20 0236   03/22/20 1604  vancomycin (VANCOCIN) 1-5 GM/200ML-% IVPB       Note to Pharmacy: [name]   : cabinet override      03/22/20 1604 03/22/20 1615   03/14/20 1100  levofloxacin (LEVAQUIN) IVPB 750 mg        750 mg 100 mL/hr over 90 Minutes Intravenous Daily 03/14/20 0955 03/19/20 1146   03/10/20 1730  doxycycline (VIBRAMYCIN) 100 mg in sodium chloride 0.9 % 250 mL IVPB  Status:  Discontinued        100 mg 125 mL/hr over 120 Minutes Intravenous Every 12 hours 03/10/20 1634 03/14/20 0955   03/10/20 1600  azithromycin (ZITHROMAX) 500 mg in sodium chloride 0.9 % 250 mL IVPB  Status:  Discontinued        500 mg 250 mL/hr over 60 Minutes Intravenous Every 24 hours 03/10/20 1529 03/10/20 1538   03/10/20 1500  clindamycin (CLEOCIN) IVPB 600 mg  Status:  Discontinued        600 mg 100 mL/hr over 30 Minutes Intravenous  Once 03/10/20 1458 03/10/20 1521       Objective   Vitals:   04/12/20 2018 04/12/20 2035 04/13/20 0424 04/13/20 0739  BP:  (!) 98/50    Pulse: 72 81 72   Resp: 18 16 18    Temp:  99 F (37.2 C)    TempSrc:  Oral    SpO2: 90% 93% 96% 95%  Weight:      Height:        SpO2: 95 % (decreased to 40% (next interval on ATC device)) O2 Flow Rate (L/min): 10 L/min FiO2 (%): 60 %  Wt Readings from Last 3 Encounters:  04/12/20 104.7 kg  01/24/20 111.8 kg  01/30/19 (!) 136.3 kg     Intake/Output Summary (Last 24 hours) at 04/13/2020 1021 Last data filed at 04/13/2020 0086 Gross per 24 hour  Intake 497 ml  Output 800 ml  Net -303 ml    Physical  Exam:  Awake Alert, Oriented X 3, flat affect No new F.N deficits,  [place].AT, Normal respiratory effort on trach collar, no wheezing, rhonchi present in all fields RRR,No Gallops,Rubs or new Murmurs,  Abd Soft, mildly tender in lower quadrant with deep palpation, increased tenderness in RUQ, no tenderness around PEG tube No Cyanosis, No new Rash or bruise    I have personally reviewed the following:  Data Reviewed:  CBC Recent Labs  Lab 04/07/20 0800 04/08/20 0517 04/09/20 0830 04/10/20 0617 04/11/20 0530 04/12/20 0515 04/13/20 0515  WBC 7.4   < > 9.5 7.6 5.6 5.9 4.8  HGB 10.9*   < > 11.1* 10.3* 10.7* 10.3* 10.4*  HCT 33.9*   < > 33.6* 32.1* 32.9* 30.7* 32.3*  PLT 177   < > 184 157 166 189 199  MCV 98.3   < > 96.3 97.6 97.3 95.9 97.3  MCH 31.6   < > 31.8 31.3 31.7 32.2 31.3  MCHC 32.2   < > 33.0 32.1 32.5 33.6 32.2  RDW 14.3   < > 14.3 14.4 14.3 14.4 14.6  LYMPHSABS 0.9  --   --   --   --   --   --   MONOABS 0.8  --   --   --   --   --   --   EOSABS 0.0  --   --   --   --   --   --   BASOSABS 0.0  --   --   --   --   --   --    < > = values in this interval not displayed.    Chemistries  Recent Labs  Lab 04/07/20 0800 04/08/20 0517 04/09/20 0830 04/10/20 0617 04/11/20 0530 04/12/20 0515 04/13/20 0515  NA 134* 133* 135 134* 135 132* 137  K 4.4 4.8 4.5 4.4 4.2 4.2 4.0  CL 99 98 99 99 99 97* 101  CO2 27 25 27 28 26 27 26   GLUCOSE 126* 86 89 101* 91 91 92  BUN 34* 31* 28* 23* 21* 21* 17  CREATININE 1.36* 1.18* 1.18* 1.21* 1.14* 1.17* 1.05*  CALCIUM 8.3* 8.3* 8.1* 8.0* 8.3* 7.8* 8.2*  MG 2.3  --   --   --   --   --   --   AST  --  14* 16  --   --   --   --   ALT  --  15 15  --   --   --   --   ALKPHOS  --  51 54  --   --   --   --   BILITOT  --  0.5 0.7  --   --   --   --    ------------------------------------------------------------------------------------------------------------------ No results for input(s): CHOL, HDL, LDLCALC, TRIG, CHOLHDL, LDLDIRECT in  the last 72 hours.  Lab Results  Component Value Date   HGBA1C 5.4 03/15/2020   ------------------------------------------------------------------------------------------------------------------ No results for input(s): TSH, T4TOTAL, T3FREE, THYROIDAB in the last 72 hours.  Invalid input(s): FREET3 ------------------------------------------------------------------------------------------------------------------ No results for input(s): VITAMINB12, FOLATE, FERRITIN, TIBC, IRON, RETICCTPCT in the last 72 hours.  Coagulation profile No results for input(s): INR, PROTIME in the last 168 hours.  No results for input(s): DDIMER in the last 72 hours.  Cardiac Enzymes No results for input(s): CKMB, TROPONINI, MYOGLOBIN in the last 168 hours.  Invalid input(s): CK ------------------------------------------------------------------------------------------------------------------    Component Value Date/Time   BNP 64.6 03/29/2020 0515    Micro Results Recent Results (from the past 240 hour(s))  Culture, blood (routine x 2)     Status: None (Preliminary result)   Collection Time: 04/09/20  8:23 AM   Specimen: BLOOD  Result Value Ref Range Status   Specimen Description   Final    BLOOD LEFT ANTECUBITAL Performed at [facility] [address], [place], [place] [address]  Special Requests   Final    BOTTLES DRAWN AEROBIC AND ANAEROBIC Blood Culture adequate volume Performed at [facility] [address], [place], [place] [address]    Culture   Final    NO GROWTH 3 DAYS Performed at [facility], [place] [address] [place], [place] [address]    Report Status PENDING  Incomplete  Culture, blood (routine x 2)     Status: None (Preliminary result)   Collection Time: 04/09/20  8:30 AM   Specimen: BLOOD  Result Value Ref Range Status   Specimen Description   Final    BLOOD LEFT HAND Performed at [facility]  [address], [place], [place] [address]    Special Requests   Final    BOTTLES DRAWN AEROBIC AND ANAEROBIC Blood Culture adequate volume Performed at [facility] [address] [place], [place] [address]    Culture   Final    NO GROWTH 3 DAYS Performed at [facility], [place] [address] [place], [place] [address]    Report Status PENDING  Incomplete  Culture, respiratory (non-expectorated)     Status: None   Collection Time: 04/09/20  3:26 PM   Specimen: Tracheal Aspirate; Respiratory  Result Value Ref Range Status   Specimen Description   Final    TRACHEAL ASPIRATE Performed at [facility] [address], [place], [place] [address]    Special Requests   Final    NONE Performed at [facility], [place] [address] [place], [place] [address]    Gram Stain   Final    NO WBC SEEN RARE GRAM POSITIVE COCCI RARE GRAM NEGATIVE RODS Performed at [facility], [place] [name], [place], [place] [address]    Culture MODERATE SERRATIA MARCESCENS  Final   Report Status 04/12/2020 FINAL  Final   Organism ID, Bacteria SERRATIA MARCESCENS  Final      Susceptibility   Serratia marcescens - MIC*    CEFAZOLIN >=64 RESISTANT Resistant     CEFEPIME <=0.12 SENSITIVE Sensitive     CEFTAZIDIME <=1 SENSITIVE Sensitive     CEFTRIAXONE <=0.25 SENSITIVE Sensitive     CIPROFLOXACIN <=0.25 SENSITIVE Sensitive     GENTAMICIN <=1 SENSITIVE Sensitive     TRIMETH/SULFA <=20 SENSITIVE Sensitive     * MODERATE SERRATIA MARCESCENS    Radiology Reports CT ABDOMEN PELVIS WO CONTRAST  Result Date: 04/09/2020 CLINICAL DATA:  Intermittent lower quadrant abdominal pain, diminished bowel movements over past 3 days, fever, history COPD, hypertension, hiatal hernia EXAM: CT ABDOMEN AND PELVIS WITHOUT CONTRAST TECHNIQUE: Multidetector CT imaging of the abdomen and pelvis was performed following the standard protocol without IV contrast. Sagittal and  coronal MPR images reconstructed from axial data set. COMPARISON:  03/15/2020 FINDINGS: Lower chest: Bibasilar consolidation, volume loss and pleural effusions Hepatobiliary: Gallbladder surgically absent. Liver normal appearance Pancreas: Atrophic, otherwise unremarkable Spleen: Normal appearance Adrenals/Urinary Tract: LEFT renal cysts unchanged. Adrenal glands, kidneys, ureters, and bladder otherwise normal appearance Stomach/Bowel: Scattered radiopacities within stool. Normal appendix. Gastrostomy tube in stomach. Mild diverticulosis of distal descending and proximal sigmoid colon without evidence of diverticulitis. Significant rectal wall thickening with surrounding perirectal infiltrative changes extending into presacral space favor proctitis though tumor not excluded. Prior ventral hernia repair with recurrent hernia identified at the inferior margin of the repair containing a nonobstructed small bowel loop (see coronal images 81-82). Vascular/Lymphatic: Atherosclerotic calcifications aorta and iliac arteries without aneurysm. No adenopathy.  Reproductive: Unremarkable uterus and adnexa Other: Infiltration of fat within small bowel mesentery consistent with fibrosing mesenteritis. No free air or free fluid. Musculoskeletal: Osseous demineralization. IMPRESSION: Distal colonic diverticulosis without evidence of diverticulitis. Significant rectal wall thickening with surrounding perirectal infiltrative changes extending into presacral space favor proctitis though tumor not excluded; recommend correlation with proctoscopy. Prior ventral hernia repair with recurrent hernia at the inferior margin of the repair, containing a nonobstructed small bowel loop. Changes of fibrosing mesenteritis. Bibasilar consolidation, volume loss and pleural effusions. Aortic Atherosclerosis (ICD10-I70.0). Electronically Signed   By: Lavonia Dana M.D.   On: 04/09/2020 13:27   IR GASTROSTOMY TUBE MOD SED  Result Date:  03/22/2020 INDICATION: Head and neck malignancy, dysphagia EXAM: FLUOROSCOPIC 16 FRENCH BALLOON RETENTION GASTROSTOMY MEDICATIONS: 1 G VANCOMYCIN; Antibiotics were administered within 1 hour of the procedure. GLUCAGON 0.5 MG IV ANESTHESIA/SEDATION: Versed 0 mg IV; Fentanyl 50 mcg IV Moderate Sedation Time:  12 MINUTES The patient was continuously monitored during the procedure by the interventional radiology nurse under my direct supervision. CONTRAST:  10 CC-administered into the gastric lumen. FLUOROSCOPY TIME:  Fluoroscopy Time: 1 minutes 36 seconds (53 mGy). COMPLICATIONS: None immediate. PROCEDURE: Informed written consent was obtained from the patient after a thorough discussion of the procedural risks, benefits and alternatives. All questions were addressed. Maximal Sterile Barrier Technique was utilized including caps, mask, sterile gowns, sterile gloves, sterile drape, hand hygiene and skin antiseptic. A timeout was performed prior to the initiation of the procedure. Previous imaging reviewed. Patient has an existing feeding tube within the stomach. This was utilized to insufflate the stomach with air. The stomach was localized beneath the left subcostal margin in the left abdomen with biplane fluoroscopy. Overlying skin marked. Under sterile conditions and local anesthesia, introducer needle was advanced into the stomach. Contrast injection confirms percutaneous needle access within the stomach. Single T tack deployed for gastropexy. Amplatz guidewire inserted. Tract dilatation performed to insert the 16 French balloon retention gastrostomy. Balloon tip inflated with 7 cc mixture of saline and 1 cc contrast. This was retracted against the anterior gastric wall. Contrast injection confirms position in the stomach. Images obtained for documentation. T tacks secured to the skin site. A sterile dressing applied. No immediate complication. Patient tolerated the procedure well. IMPRESSION: Successful  fluoroscopic 52 French balloon retention gastrostomy Electronically Signed   By: Jerilynn Mages.  Shick M.D.   On: 03/22/2020 16:53   CT CHEST ABDOMEN PELVIS W CONTRAST  Result Date: 03/15/2020 CLINICAL DATA:  New diagnosis of head neck mass EXAM: CT CHEST, ABDOMEN, AND PELVIS WITH CONTRAST TECHNIQUE: Multidetector CT imaging of the chest, abdomen and pelvis was performed following the standard protocol during bolus administration of intravenous contrast. CONTRAST:  166mL OMNIPAQUE IOHEXOL 300 MG/ML  SOLN COMPARISON:  Prior swallowing evaluation and CT of the neck. FINDINGS: CT CHEST FINDINGS Cardiovascular: Calcified coronary artery disease. Aorta is normal caliber. 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 Central pulmonary vasculature is mildly engorged. Approximately 3 cm greatest caliber unchanged from previous exam. Unremarkable on limited venous phase assessment. Mediastinum/Nodes: Supraglottic mass partially imaged on the first image of the data set. Interval placement of tracheostomy tube with resultant pneumomediastinum in the anterior mediastinum in upper mediastinum. No adenopathy within the mediastinum or axilla. No hilar adenopathy. Rounded partial necrotic lymph node in the LEFT neck partially visualized, corresponding to level IIIb lymph node seen on the neck CT. Lungs/Pleura: Basilar consolidative changes bilaterally with similar pattern of consolidation though decreased volume  loss when compared to the study of September of 2020. Airways are patent. Musculoskeletal: See below for full musculoskeletal detail. CT ABDOMEN PELVIS FINDINGS Hepatobiliary: Liver without focal lesion. Post cholecystectomy. No biliary duct dilation. Pancreas: Mild atrophy of the pancreas without focal lesion, ductal dilation or inflammation. Spleen: Spleen normal in size and contour. Adrenals/Urinary Tract: Adrenal glands are normal. Symmetric renal enhancement. No hydronephrosis. LEFT renal  cysts, largest arising from the lower pole measuring 2.8 x 2.9 cm. Urinary bladder under distended limiting assessment. Stomach/Bowel: Gastrointestinal tract with signs of colonic diverticulosis. No acute bowel process. Rectus diastasis. Postoperative changes in the midline of the abdomen related to prior ventral hernia repair. Mild bulging at the site of hernia repair, no herniation beyond the inserted mesh near the umbilicus. Vascular/Lymphatic: Calcified atheromatous plaque in the abdominal aorta. No aneurysmal dilation. There is no gastrohepatic or hepatoduodenal ligament lymphadenopathy. No retroperitoneal or mesenteric lymphadenopathy. No pelvic sidewall lymphadenopathy. Reproductive: No adnexal mass. Reproductive structures are unremarkable. Other: Post abdominal wall reconstruction with rectus diastasis, no frank hernia. Musculoskeletal: Spinal degenerative changes. Degenerative changes in glenohumeral joints and hips. Ununited fractures along posterior LEFT chest involving ribs 10 through 12. These appear subacute or chronic. These did not appear to be present on previous imaging from September of 2020. IMPRESSION: 1. Supraglottic mass partially imaged on the first image of the data set. No signs of metastatic disease to the chest, abdomen or pelvis. 2. Rounded partially necrotic lymph node in the LEFT neck partially visualized, corresponding to level IIIb lymph node seen on the neck CT. 3. Basilar consolidative changes bilaterally with similar pattern of consolidation though decreased volume loss when compared to the study of September of 2020. Findings may be related to aspiration. No definite lesions seen in the chest though these areas could obscure underlying lesions. 4. Postoperative changes with small amount of pneumomediastinum presumably related to recent tracheostomy tube insertion. 5. Ununited fractures along posterior LEFT chest involving ribs 10 through 12. These appear subacute or chronic.  These did not appear to be present on previous imaging from September of 2020. 6. Post abdominal wall reconstruction with rectus diastasis, no frank hernia. 7. Aortic atherosclerosis. Aortic Atherosclerosis (ICD10-I70.0). Electronically Signed   By: Zetta Bills M.D.   On: 03/15/2020 10:53   DG CHEST PORT 1 VIEW  Result Date: 04/11/2020 CLINICAL DATA:  Hypoxia. EXAM: PORTABLE CHEST 1 VIEW COMPARISON:  April 08, 2020. FINDINGS: Stable cardiomediastinal silhouette. Tracheostomy tube is unchanged. No pneumothorax is noted. Stable left pleural effusion is noted with associated left basilar atelectasis or infiltrate. Right midlung subsegmental atelectasis is noted with probable small right pleural effusion. Bony thorax is unremarkable. IMPRESSION: Stable left pleural effusion with associated left basilar atelectasis or infiltrate. Right midlung subsegmental atelectasis is noted with probable small right pleural effusion. Electronically Signed   By: Marijo Conception M.D.   On: 04/11/2020 13:20   DG CHEST PORT 1 VIEW  Result Date: 04/08/2020 CLINICAL DATA:  Respiratory failure. EXAM: PORTABLE CHEST 1 VIEW COMPARISON:  April 03, 2020 FINDINGS: There is stable tracheostomy tube positioning. Mild, diffuse chronic appearing increased interstitial lung markings are seen. Mild to moderate severity atelectasis and/or infiltrate is seen within the right lung base. This is similar in severity when compared to the prior study. Marked severity left basilar atelectasis and/or infiltrate is seen and is increased in severity when compared to the prior exam. There is no evidence of a pleural effusion or pneumothorax. There is mild to moderate severity enlargement of  the cardiac silhouette. Degenerative changes are seen throughout the thoracic spine. IMPRESSION: 1. Bibasilar atelectasis and/or infiltrate, left greater than right. Electronically Signed   By: Virgina Norfolk M.D.   On: 04/08/2020 16:45   DG CHEST PORT 1  VIEW  Result Date: 04/03/2020 CLINICAL DATA:  Increasing shortness of breath EXAM: PORTABLE CHEST 1 VIEW COMPARISON:  03/31/2020 FINDINGS: Tracheostomy tube is noted in satisfactory position. Cardiac shadow is enlarged but stable. Some persistent bibasilar opacities are noted although mildly improved when compared with the prior exam. No new focal abnormality is noted. IMPRESSION: Improved aeration as described. Electronically Signed   By: Inez Catalina M.D.   On: 04/03/2020 11:59   DG CHEST PORT 1 VIEW  Result Date: 03/31/2020 CLINICAL DATA:  Pleural effusion EXAM: PORTABLE CHEST 1 VIEW COMPARISON:  03/29/2020 FINDINGS: Tracheostomy unchanged. Lung volumes are small, however, pulmonary insufflation remains stable since prior examination. Bilateral pleural effusions are present small and decreased in size since prior examination. Ovoid opacity within the right mid lung zone likely represents fluid within the fissure. There is bibasilar atelectasis. Cardiac size is within normal limits. No acute bone abnormality. IMPRESSION: Slight interval decrease in small bilateral pleural effusions with associated bibasilar atelectasis. Pulmonary hypoinflation. Electronically Signed   By: Fidela Salisbury MD   On: 03/31/2020 05:41   DG CHEST PORT 1 VIEW  Result Date: 03/29/2020 CLINICAL DATA:  Dyspnea EXAM: PORTABLE CHEST 1 VIEW COMPARISON:  03/25/2020 FINDINGS: Tracheostomy unchanged. Pulmonary insufflation is symmetric though has decreased slightly since prior examination. Small bilateral pleural effusions have developed with associated bibasilar atelectasis. No pneumothorax. Cardiac size within normal limits. Pulmonary vascularity is normal. No acute bone abnormality. IMPRESSION: Interval development of small bilateral pleural effusions with bibasilar compressive atelectasis. Electronically Signed   By: Fidela Salisbury MD   On: 03/29/2020 06:00   DG CHEST PORT 1 VIEW  Result Date: 03/25/2020 CLINICAL DATA:   Hypoxia, shortness of breath, altered mental status history COPD, hypertension, history laryngeal carcinoma, tracheostomy, undergoing radiation therapy EXAM: PORTABLE CHEST 1 VIEW COMPARISON:  Portable exam 2229 hours compared to 03/23/2020 FINDINGS: Tracheostomy tube projects over tracheal air column. Normal heart size, mediastinal contours, and pulmonary vascularity. Improving opacity at LEFT lung base which could represent atelectasis or consolidation. Subsegmental atelectasis RIGHT middle lobe slightly improved. Tiny LEFT pleural effusion. Remaining lungs clear. No pneumothorax. Bones demineralized. IMPRESSION: Persistent subsegmental atelectasis at RIGHT base with improving atelectasis versus consolidation and tiny pleural effusion at LEFT base. Electronically Signed   By: Lavonia Dana M.D.   On: 03/25/2020 10:33   DG CHEST PORT 1 VIEW  Result Date: 03/23/2020 CLINICAL DATA:  Hypoxia. EXAM: PORTABLE CHEST 1 VIEW COMPARISON:  Left 19 2021. FINDINGS: Interim removal of feeding tube. Tracheostomy tube in stable position. Cardiomegaly. Persistent bibasilar atelectasis/infiltrates and small bilateral pleural effusions. Density noted over the right mid chest may represent atelectasis and or pleural fluid pseudotumor, no interim change. IMPRESSION: 1. Interim removal of feeding tube. Tracheostomy tube in stable position. 2. Stable cardiomegaly. 3. Persistent bibasilar atelectasis/infiltrates and small bilateral pleural effusions. Density of the right mid chest may represent atelectasis and or pleural fluid pseudotumor, no interim change. Electronically Signed   By: Marcello Moores  Register   On: 03/23/2020 06:39   DG CHEST PORT 1 VIEW  Result Date: 03/19/2020 CLINICAL DATA:  Pneumonia. EXAM: PORTABLE CHEST 1 VIEW COMPARISON:  03/18/2020. FINDINGS: Tracheostomy tube, feeding tube in stable position. Stable cardiomegaly. Improved pulmonary venous congestion. Lung volumes. Persistent left base atelectasis/infiltrate.  Persistent  right base subsegmental atelectasis. Elliptical density noted over the right mid chest may represent atelectasis and or fissural fluid. Pleural effusions cannot be excluded. No pneumothorax. IMPRESSION: 1. Lines and tubes in stable position. 2. Stable cardiomegaly. Improved pulmonary venous congestion. 3. Persistent left base atelectasis/infiltrate. Persistent right base subsegmental atelectasis. Elliptical density noted over the right mid chest may represent atelectasis and or fissural fluid. Small pleural effusions cannot be excluded. Electronically Signed   By: Marcello Moores  Register   On: 03/19/2020 06:03   DG Chest Port 1 View  Result Date: 03/18/2020 CLINICAL DATA:  Respiratory failure. EXAM: PORTABLE CHEST 1 VIEW COMPARISON:  03/17/2020. FINDINGS: Tracheostomy tube and feeding tube in stable position. Cardiomegaly. Mild pulmonary venous congestion. Mild bilateral interstitial prominence. Mild component of CHF cannot be excluded. Persistent left lower lobe atelectasis/infiltrate. New onset right mid lung prominent atelectatic changes. No pneumothorax. IMPRESSION: 1. Tracheostomy tube and feeding tube in stable position. 2. Cardiomegaly with mild pulmonary venous congestion and mild bilateral interstitial prominence. Mild component of CHF cannot be excluded. 3. Persistent left lower lobe atelectasis/infiltrate. New onset of right mid lung prominent atelectatic changes. Electronically Signed   By: Marcello Moores  Register   On: 03/18/2020 07:34   DG CHEST PORT 1 VIEW  Result Date: 03/17/2020 CLINICAL DATA:  Respiratory failure EXAM: PORTABLE CHEST 1 VIEW COMPARISON:  March 14, 2020 FINDINGS: Tracheostomy catheter tip is 6.2 cm above the carina. Feeding tube tip is below the diaphragm. No pneumothorax. There is a small left pleural effusion with consolidation in the left lower lobe. There is right base atelectasis with equivocal small right pleural effusion. Heart is mildly enlarged, stable, with  pulmonary vascularity normal. No adenopathy. No bone lesions. IMPRESSION: Tube positions as described without pneumothorax. Left pleural effusion with equivocal right pleural effusion. Airspace opacity consistent with combination of atelectasis and pneumonia left lower lobe. Mild atelectasis right base. Stable cardiac prominence. Electronically Signed   By: Lowella Grip III M.D.   On: 03/17/2020 09:02   DG Abd Portable 1V  Result Date: 04/07/2020 CLINICAL DATA:  Abdomen pain EXAM: PORTABLE ABDOMEN - 1 VIEW COMPARISON:  CT 03/15/2020 FINDINGS: Gastrostomy tube in the left mid abdomen. Nonobstructed bowel-gas pattern. Evidence of prior hernia repair. Curvilinear densities in the right lower quadrant, possible calcifications or retained contrast within appendix. IMPRESSION: Nonobstructed gas pattern. Electronically Signed   By: Donavan Foil M.D.   On: 04/07/2020 21:16   DG Swallowing Func-Speech Pathology  Result Date: 03/27/2020 Objective Swallowing Evaluation: Type of Study: MBS-Modified Barium Swallow Study  Patient Details Name: GRIFFIN DEWILDE MRN: [id number] Date of Birth: 08/13/1960 Today's Date: 03/27/2020 Time: SLP Start Time (ACUTE ONLY): 1962 -SLP Stop Time (ACUTE ONLY): 1235 SLP Time Calculation (min) (ACUTE ONLY): 20 min Past Medical History: Past Medical History: Diagnosis Date . Bronchitis  . Class 3 obesity 01/23/2020 . COPD (chronic obstructive pulmonary disease) ([place])  . Degenerative disc disease, lumbar  . Hiatal hernia  . Hypertension  Past Surgical History: Past Surgical History: Procedure Laterality Date . CHOLECYSTECTOMY   . degenerative bone disease   . DIRECT LARYNGOSCOPY N/A 03/13/2020  Procedure: DIRECT LARYNGOSCOPY WITH BIOPSY;  Surgeon: Jason Coop, DO;  Location: [facility];  Service: ENT;  Laterality: N/A; . INCISIONAL HERNIA REPAIR N/A 01/15/2019  Procedure: [name] HERNIORRHAPHY WITH MESH;  Surgeon: [name], MD;  Location: [facility];  Service: General;  Laterality:  N/A; . IR GASTROSTOMY TUBE MOD SED  03/22/2020 . OMENTECTOMY N/A 01/15/2019  Procedure: OMENTECTOMY;  Surgeon: [name],  MD;  Location: [facility];  Service: General;  Laterality: N/A; . TRACHEOSTOMY TUBE PLACEMENT N/A 03/13/2020  Procedure: AWAKE TRACHEOSTOMY;  Surgeon: Jason Coop, DO;  Location: [facility];  Service: ENT;  Laterality: N/A; HPI: Soumya Colson is a 59 y/o F with history of COPD,chronic respiratory failure on home oxygen at 5 L/min, with large laryngeal mass lesion, now s/p tracheostomy with Shiley 6-0 and direct laryngoscopy and biopsy. 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  Pt is also s/p PEG for nutrition.  Plans are for chemo and radiation concurrently starting while in hospital.  Prior medical history includes smoking and some dyspnea if walking long distanced.  Subjective: pt awake in chair Assessment / Plan / Recommendation CHL IP CLINICAL IMPRESSIONS 03/27/2020 Clinical Impression Pt continues with with mild pharyngeal phase dysphagia - obstructive due to mass impacting epiglottic deflection.  In addition, she requires extra time to masticate solids due to dentition.  Swallow trigger is timely with minimal laryngeal penetration of liquid into larynx during the swallow due to decreased epiglottic deflection.  Min vallecular residue with solids without pt awareness - liquid wash assisted to clear.  Pt with prominent cricopharyngeus and appears with trace backflow near this region but view was suboptimal.  Recommend D3/mech soft and thin liquids, po medications whole in puree and follow with liquid wash.  Of note, following testing, pt did cough and slight whitish coating noted on secretions but no aspiration noted during testing.  Suspect pt has some low grade chronic secretion aspiration. SLP Visit Diagnosis Dysphagia, oropharyngeal phase (R13.12) Attention and concentration deficit following -- Frontal lobe and executive function deficit following -- Impact on  safety and function Mild aspiration risk   CHL IP TREATMENT RECOMMENDATION 03/27/2020 Treatment Recommendations Therapy as outlined in treatment plan below   Prognosis 03/27/2020 Prognosis for Safe Diet Advancement Fair Barriers to Reach Goals Other (Comment) Barriers/Prognosis Comment -- CHL IP DIET RECOMMENDATION 03/27/2020 SLP Diet Recommendations Dysphagia 3 (Mech soft) solids;Thin liquid Liquid Administration via -- Medication Administration Whole meds with puree Compensations Slow rate;Small sips/bites Postural Changes --   CHL IP OTHER RECOMMENDATIONS 03/27/2020 Recommended Consults (No Data) Oral Care Recommendations Oral care BID Other Recommendations Clarify dietary restrictions   CHL IP FOLLOW UP RECOMMENDATIONS 03/27/2020 Follow up Recommendations Skilled Nursing facility   [facility] IP FREQUENCY AND DURATION 03/27/2020 Speech Therapy Frequency (ACUTE ONLY) min 1 x/week Treatment Duration 1 week      CHL IP ORAL PHASE 03/27/2020 Oral Phase WFL Oral - Pudding Teaspoon -- Oral - Pudding Cup -- Oral - Honey Teaspoon -- Oral - Honey Cup -- Oral - Nectar Teaspoon -- Oral - Nectar Cup -- Oral - Nectar Straw -- Oral - Thin Teaspoon -- Oral - Thin Cup -- Oral - Thin Straw -- Oral - Puree -- Oral - Mech Soft -- Oral - Regular -- Oral - Multi-Consistency -- Oral - Pill -- Oral Phase - Comment --  CHL IP PHARYNGEAL PHASE 03/27/2020 Pharyngeal Phase -- Pharyngeal- Pudding Teaspoon -- Pharyngeal -- Pharyngeal- Pudding Cup -- Pharyngeal -- Pharyngeal- Honey Teaspoon -- Pharyngeal -- Pharyngeal- Honey Cup -- Pharyngeal -- Pharyngeal- Nectar Teaspoon -- Pharyngeal -- Pharyngeal- Nectar Cup -- Pharyngeal -- Pharyngeal- Nectar Straw Reduced epiglottic inversion;Pharyngeal residue - valleculae Pharyngeal -- Pharyngeal- Thin Teaspoon Reduced epiglottic inversion;Pharyngeal residue - valleculae Pharyngeal -- Pharyngeal- Thin Cup Pharyngeal residue - valleculae;Reduced epiglottic inversion;Penetration/Aspiration during swallow  Pharyngeal Material enters airway, remains ABOVE vocal cords then ejected out Pharyngeal- Thin Straw Reduced epiglottic inversion;Pharyngeal residue -  valleculae;Penetration/Aspiration during swallow Pharyngeal Material enters airway, remains ABOVE vocal cords then ejected out Pharyngeal- Puree WFL Pharyngeal -- Pharyngeal- Mechanical Soft -- Pharyngeal -- Pharyngeal- Regular Reduced epiglottic inversion Pharyngeal -- Pharyngeal- Multi-consistency -- Pharyngeal -- Pharyngeal- Pill NT Pharyngeal -- Pharyngeal Comment --  CHL IP CERVICAL ESOPHAGEAL PHASE 03/27/2020 Cervical Esophageal Phase -- Pudding Teaspoon -- Pudding Cup -- Honey Teaspoon -- Honey Cup -- Nectar Teaspoon -- Nectar Cup -- Nectar Straw -- Thin Teaspoon -- Thin Cup Prominent cricopharyngeal segment Thin Straw -- Puree -- Mechanical Soft -- Regular -- Multi-consistency -- Pill -- Cervical Esophageal Comment appearance of potential minimal backflow of liquid near UES region = suboptimal view - did not backflow into pharynx/larynx [name], MS [facility] SLP Acute Rehab Services Office [phone] Pager [phone] [name] 03/27/2020, 2:31 PM                Time Spent in minutes  30     Desiree Hane M.D on 04/13/2020 at 10:21 AM  To page go to [email or web address] - password [facility]

## 2020-04-13 NOTE — Progress Notes (Signed)
New ATC set up.

## 2020-04-13 NOTE — Progress Notes (Signed)
[facility] CSW Progress Notes  Spoke w J McKeel, [employer].  They are in possession of her completed Medicaid application and will submit it to [facility] this week.  She has been referred to [facility] for disability assistance, [facility] needs letter from provider documenting that she is unable to speak for herself on the phone - letter should specify that patient needs someone to speak on her behalf.  Edwyna Shell, LCSW Clinical Social Worker Phone:  [phone] Cell:  [phone]

## 2020-04-13 NOTE — Plan of Care (Signed)
  Problem: Health Behavior/Discharge Planning: Goal: Ability to manage health-related needs will improve Outcome: Progressing   Problem: Clinical Measurements: Goal: Ability to maintain clinical measurements within normal limits will improve Outcome: Progressing Goal: Will remain free from infection Outcome: Progressing Goal: Diagnostic test results will improve Outcome: Progressing Goal: Respiratory complications will improve Outcome: Progressing Goal: Cardiovascular complication will be avoided Outcome: Progressing   Problem: Activity: Goal: Risk for activity intolerance will decrease Outcome: Progressing   Problem: Nutrition: Goal: Adequate nutrition will be maintained Outcome: Progressing   Problem: Coping: Goal: Level of anxiety will decrease Outcome: Progressing   Problem: Elimination: Goal: Will not experience complications related to bowel motility Outcome: Progressing Goal: Will not experience complications related to urinary retention Outcome: Progressing   Problem: Pain Managment: Goal: General experience of comfort will improve Outcome: Progressing   Problem: Safety: Goal: Ability to remain free from injury will improve Outcome: Progressing   Problem: Skin Integrity: Goal: Risk for impaired skin integrity will decrease Outcome: Progressing   Problem: Education: Goal: Knowledge about tracheostomy care/management will improve Outcome: Progressing   Problem: Activity: Goal: Ability to tolerate increased activity will improve Outcome: Progressing   Problem: Health Behavior/Discharge Planning: Goal: Ability to manage tracheostomy will improve Outcome: Progressing   Problem: Respiratory: Goal: Patent airway maintenance will improve Outcome: Progressing   Problem: Education: Goal: Knowledge about tracheostomy care/management will improve Outcome: Progressing   Problem: Activity: Goal: Ability to tolerate increased activity will improve Outcome:  Progressing   Problem: Respiratory: Goal: Patent airway maintenance will improve Outcome: Progressing

## 2020-04-13 NOTE — Progress Notes (Addendum)
Nutrition Follow-up  DOCUMENTATION CODES:   Obesity unspecified  INTERVENTION:   Downgrade to Dysphagia 3 diet for ease of chewing/swallowing.  Discontinue Ensure Enlive TID  Boost Plus once daily, each supplement provides 360 kcals, 14 grams of protein  Continue 237 ml Osmolite 1.5 TID via PEG Flush with 60 ml before and after each bolus (360 ml total)  NUTRITION DIAGNOSIS:   Increased nutrient needs related to cancer and cancer related treatments,chronic illness as evidenced by estimated needs.  Progressing  GOAL:   Patient will meet greater than or equal to 90% of their needs  Progressing  MONITOR:   TF tolerance,Diet advancement,Labs,Weight trends  REASON FOR ASSESSMENT:   Consult Assessment of nutrition requirement/status  ASSESSMENT:   59 year old female with history significant of hiatal hernia, HTN, bronchitis, COPD, and chronic respiratory failure on 5 L oxygen at night, presents with AMS and worsening dyspnea over the last 4 days.  11/11 MBS by SLP with abnormal larynx, CT neck with supraglottic mass 11/13 Direct laryngoscopy with biopsy and trach 11/14 intubated 11/15 CT chest/abdomen pelvis with no signs of metastatic disease, partially necrotic lymph node to left neck, Cortrak placed, transitioned to trach collar 11/17 transferred to [facility] for chemoradiation 11/22: PEG placement 11/27: MBS: SLP recommending D3 diet with thin liquids 12/1: diet liberalized to regular  12/3: Bolus feeds resumed 12/13: underwent flex sigmoidoscopy, revealed large rectal ulcer  Spoke with pt at bedside. Pt was able to communicate via writing responses onto paper.   Pt stated she was frustrated with her meals because they are not sending her food soft enough to eat. Pt complained that she ordered toast with butter and it is too hard for her to consume. She mentioned she is able to each spaghetti and meatloaf because they are softer. She reports she received a piece of  chicken for a meal and could not eat it because it was not cut up and soft enough. Pt reports that she is not drinking her Ensures because they are too thick. She was wanting to try Boost drinks instead.   Pt reports no issues with her TF via PEG tube. She is currently on 237 mL Osmolite 1.5 TID with 60 mL free water flushes before and after each bolus. This regimen provides 1065 kcals, 44 grams protein and 903 mL free water.   Pt complained of recent diarrhea. Noted she is on stool softeners because of recent constipation and antibiotics BID. TF formula is fiber free. Per MD, it is appropriate to discontinue stool softeners for today only.   Per chart review, pt's weight fluctuates frequently during this admission.   Labs reviewed.   Medications reviewed and include: dolcolax, colace, miralax,  SSI, MVI with minerals, protonix, Cipro  Diet Order:   Diet Order            DIET DYS 3 Room service appropriate? Yes with Assist; Fluid consistency: Thin  Diet effective now                 EDUCATION NEEDS:   Not appropriate for education at this time  Skin:  Skin Assessment: Skin Integrity Issues: Skin Integrity Issues:: Other (Comment) Other: Cellulits; LLE; MASD; R thigh  Last BM:  04/12/20  Height:   Ht Readings from Last 1 Encounters:  03/14/20 5\' 5"  (1.651 m)    Weight:   Wt Readings from Last 1 Encounters:  04/12/20 104.7 kg    BMI:  Body mass index is 38.41 kg/m.  Estimated Nutritional Needs:   Kcal:  2100-2300  Protein:  85-100g  Fluid:  2L/day    Ronnald Nian, Dietetic Intern Pager: [phone] If unavailable: [phone]

## 2020-04-13 NOTE — Progress Notes (Addendum)
HEMATOLOGY-ONCOLOGY PROGRESS NOTE  SUBJECTIVE:    Underwent flex sigmoidoscopy yesterday This showed a large rectal ulcer likely a stercoral ulcer and doubt malignancy. Biopsies taken. Benign-appearing distal rectal polyp removed with snare cautery. GI recommended laxatives and supportive care. The patient reports ongoing abdominal pain this morning. Rates abdominal pain 9/10 in pain primarily in the right upper quadrant. Breathing remains stable. Having some pain in her mouth but able to eat and drink. No bleeding reported. No recurrent fevers.  Rest of the pertinent 10 point ROS reviewed and negative  I have reviewed the past medical history, past surgical history, social history and family history with the patient and they are unchanged from previous note.   PHYSICAL EXAMINATION: ECOG PERFORMANCE STATUS: 2 - Symptomatic, <50% confined to bed  Vitals:   04/13/20 0424 04/13/20 0739  BP:    Pulse: 72   Resp: 18   Temp:    SpO2: 96% 95%   Filed Weights   04/10/20 0513 04/11/20 0500 04/12/20 0500  Weight: 126.3 kg 118.6 kg 104.7 kg    Intake/Output from previous day: 12/13 0701 - 12/14 0700 In: 497 [I.V.:200; NG/GT:237] Out: 800 [Urine:800]  GENERAL: alert, no distress and comfortable, on trach collar Neck: Significant decrease in the LN  Ant lung fields clear. Trach site with clear mucus Abdomen. PEG site clean , tenderness to the right upper quadrant with palpation, BS normal.  BLE venous stasis , no change.  LABORATORY DATA:  I have reviewed the data as listed CMP Latest Ref Rng & Units 04/13/2020 04/12/2020 04/11/2020  Glucose 70 - 99 mg/dL 92 91 91  BUN 6 - 20 mg/dL 17 21(H) 21(H)  Creatinine 0.44 - 1.00 mg/dL 1.05(H) 1.17(H) 1.14(H)  Sodium 135 - 145 mmol/L 137 132(L) 135  Potassium 3.5 - 5.1 mmol/L 4.0 4.2 4.2  Chloride 98 - 111 mmol/L 101 97(L) 99  CO2 22 - 32 mmol/L 26 27 26   Calcium 8.9 - 10.3 mg/dL 8.2(L) 7.8(L) 8.3(L)  Total Protein 6.5 - 8.1 g/dL -  - -  Total Bilirubin 0.3 - 1.2 mg/dL - - -  Alkaline Phos 38 - 126 U/L - - -  AST 15 - 41 U/L - - -  ALT 0 - 44 U/L - - -    Lab Results  Component Value Date   WBC 4.8 04/13/2020   HGB 10.4 (L) 04/13/2020   HCT 32.3 (L) 04/13/2020   MCV 97.3 04/13/2020   PLT 199 04/13/2020   NEUTROABS 5.7 04/07/2020    CT ABDOMEN PELVIS WO CONTRAST  Result Date: 04/09/2020 CLINICAL DATA:  Intermittent lower quadrant abdominal pain, diminished bowel movements over past 3 days, fever, history COPD, hypertension, hiatal hernia EXAM: CT ABDOMEN AND PELVIS WITHOUT CONTRAST TECHNIQUE: Multidetector CT imaging of the abdomen and pelvis was performed following the standard protocol without IV contrast. Sagittal and coronal MPR images reconstructed from axial data set. COMPARISON:  03/15/2020 FINDINGS: Lower chest: Bibasilar consolidation, volume loss and pleural effusions Hepatobiliary: Gallbladder surgically absent. Liver normal appearance Pancreas: Atrophic, otherwise unremarkable Spleen: Normal appearance Adrenals/Urinary Tract: LEFT renal cysts unchanged. Adrenal glands, kidneys, ureters, and bladder otherwise normal appearance Stomach/Bowel: Scattered radiopacities within stool. Normal appendix. Gastrostomy tube in stomach. Mild diverticulosis of distal descending and proximal sigmoid colon without evidence of diverticulitis. Significant rectal wall thickening with surrounding perirectal infiltrative changes extending into presacral space favor proctitis though tumor not excluded. Prior ventral hernia repair with recurrent hernia identified at the inferior margin of the repair containing a  nonobstructed small bowel loop (see coronal images 81-82). Vascular/Lymphatic: Atherosclerotic calcifications aorta and iliac arteries without aneurysm. No adenopathy. Reproductive: Unremarkable uterus and adnexa Other: Infiltration of fat within small bowel mesentery consistent with fibrosing mesenteritis. No free air or free  fluid. Musculoskeletal: Osseous demineralization. IMPRESSION: Distal colonic diverticulosis without evidence of diverticulitis. Significant rectal wall thickening with surrounding perirectal infiltrative changes extending into presacral space favor proctitis though tumor not excluded; recommend correlation with proctoscopy. Prior ventral hernia repair with recurrent hernia at the inferior margin of the repair, containing a nonobstructed small bowel loop. Changes of fibrosing mesenteritis. Bibasilar consolidation, volume loss and pleural effusions. Aortic Atherosclerosis (ICD10-I70.0). Electronically Signed   By: Lavonia Dana M.D.   On: 04/09/2020 13:27   IR GASTROSTOMY TUBE MOD SED  Result Date: 03/22/2020 INDICATION: Head and neck malignancy, dysphagia EXAM: FLUOROSCOPIC 16 FRENCH BALLOON RETENTION GASTROSTOMY MEDICATIONS: 1 G VANCOMYCIN; Antibiotics were administered within 1 hour of the procedure. GLUCAGON 0.5 MG IV ANESTHESIA/SEDATION: Versed 0 mg IV; Fentanyl 50 mcg IV Moderate Sedation Time:  12 MINUTES The patient was continuously monitored during the procedure by the interventional radiology nurse under my direct supervision. CONTRAST:  10 CC-administered into the gastric lumen. FLUOROSCOPY TIME:  Fluoroscopy Time: 1 minutes 36 seconds (53 mGy). COMPLICATIONS: None immediate. PROCEDURE: Informed written consent was obtained from the patient after a thorough discussion of the procedural risks, benefits and alternatives. All questions were addressed. Maximal Sterile Barrier Technique was utilized including caps, mask, sterile gowns, sterile gloves, sterile drape, hand hygiene and skin antiseptic. A timeout was performed prior to the initiation of the procedure. Previous imaging reviewed. Patient has an existing feeding tube within the stomach. This was utilized to insufflate the stomach with air. The stomach was localized beneath the left subcostal margin in the left abdomen with biplane fluoroscopy.  Overlying skin marked. Under sterile conditions and local anesthesia, introducer needle was advanced into the stomach. Contrast injection confirms percutaneous needle access within the stomach. Single T tack deployed for gastropexy. Amplatz guidewire inserted. Tract dilatation performed to insert the 16 French balloon retention gastrostomy. Balloon tip inflated with 7 cc mixture of saline and 1 cc contrast. This was retracted against the anterior gastric wall. Contrast injection confirms position in the stomach. Images obtained for documentation. T tacks secured to the skin site. A sterile dressing applied. No immediate complication. Patient tolerated the procedure well. IMPRESSION: Successful fluoroscopic 10 French balloon retention gastrostomy Electronically Signed   By: Jerilynn Mages.  Shick M.D.   On: 03/22/2020 16:53   CT CHEST ABDOMEN PELVIS W CONTRAST  Result Date: 03/15/2020 CLINICAL DATA:  New diagnosis of head neck mass EXAM: CT CHEST, ABDOMEN, AND PELVIS WITH CONTRAST TECHNIQUE: Multidetector CT imaging of the chest, abdomen and pelvis was performed following the standard protocol during bolus administration of intravenous contrast. CONTRAST:  113mL OMNIPAQUE IOHEXOL 300 MG/ML  SOLN COMPARISON:  Prior swallowing evaluation and CT of the neck. FINDINGS: CT CHEST FINDINGS Cardiovascular: Calcified coronary artery disease. Aorta is normal caliber. 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 Central pulmonary vasculature is mildly engorged. Approximately 3 cm greatest caliber unchanged from previous exam. Unremarkable on limited venous phase assessment. Mediastinum/Nodes: Supraglottic mass partially imaged on the first image of the data set. Interval placement of tracheostomy tube with resultant pneumomediastinum in the anterior mediastinum in upper mediastinum. No adenopathy within the mediastinum or axilla. No hilar adenopathy. Rounded partial necrotic lymph node in the LEFT neck  partially visualized, corresponding to level IIIb  lymph node seen on the neck CT. Lungs/Pleura: Basilar consolidative changes bilaterally with similar pattern of consolidation though decreased volume loss when compared to the study of September of 2020. Airways are patent. Musculoskeletal: See below for full musculoskeletal detail. CT ABDOMEN PELVIS FINDINGS Hepatobiliary: Liver without focal lesion. Post cholecystectomy. No biliary duct dilation. Pancreas: Mild atrophy of the pancreas without focal lesion, ductal dilation or inflammation. Spleen: Spleen normal in size and contour. Adrenals/Urinary Tract: Adrenal glands are normal. Symmetric renal enhancement. No hydronephrosis. LEFT renal cysts, largest arising from the lower pole measuring 2.8 x 2.9 cm. Urinary bladder under distended limiting assessment. Stomach/Bowel: Gastrointestinal tract with signs of colonic diverticulosis. No acute bowel process. Rectus diastasis. Postoperative changes in the midline of the abdomen related to prior ventral hernia repair. Mild bulging at the site of hernia repair, no herniation beyond the inserted mesh near the umbilicus. Vascular/Lymphatic: Calcified atheromatous plaque in the abdominal aorta. No aneurysmal dilation. There is no gastrohepatic or hepatoduodenal ligament lymphadenopathy. No retroperitoneal or mesenteric lymphadenopathy. No pelvic sidewall lymphadenopathy. Reproductive: No adnexal mass. Reproductive structures are unremarkable. Other: Post abdominal wall reconstruction with rectus diastasis, no frank hernia. Musculoskeletal: Spinal degenerative changes. Degenerative changes in glenohumeral joints and hips. Ununited fractures along posterior LEFT chest involving ribs 10 through 12. These appear subacute or chronic. These did not appear to be present on previous imaging from September of 2020. IMPRESSION: 1. Supraglottic mass partially imaged on the first image of the data set. No signs of metastatic disease to  the chest, abdomen or pelvis. 2. Rounded partially necrotic lymph node in the LEFT neck partially visualized, corresponding to level IIIb lymph node seen on the neck CT. 3. Basilar consolidative changes bilaterally with similar pattern of consolidation though decreased volume loss when compared to the study of September of 2020. Findings may be related to aspiration. No definite lesions seen in the chest though these areas could obscure underlying lesions. 4. Postoperative changes with small amount of pneumomediastinum presumably related to recent tracheostomy tube insertion. 5. Ununited fractures along posterior LEFT chest involving ribs 10 through 12. These appear subacute or chronic. These did not appear to be present on previous imaging from September of 2020. 6. Post abdominal wall reconstruction with rectus diastasis, no frank hernia. 7. Aortic atherosclerosis. Aortic Atherosclerosis (ICD10-I70.0). Electronically Signed   By: Zetta Bills M.D.   On: 03/15/2020 10:53   DG CHEST PORT 1 VIEW  Result Date: 04/11/2020 CLINICAL DATA:  Hypoxia. EXAM: PORTABLE CHEST 1 VIEW COMPARISON:  April 08, 2020. FINDINGS: Stable cardiomediastinal silhouette. Tracheostomy tube is unchanged. No pneumothorax is noted. Stable left pleural effusion is noted with associated left basilar atelectasis or infiltrate. Right midlung subsegmental atelectasis is noted with probable small right pleural effusion. Bony thorax is unremarkable. IMPRESSION: Stable left pleural effusion with associated left basilar atelectasis or infiltrate. Right midlung subsegmental atelectasis is noted with probable small right pleural effusion. Electronically Signed   By: Marijo Conception M.D.   On: 04/11/2020 13:20   DG CHEST PORT 1 VIEW  Result Date: 04/08/2020 CLINICAL DATA:  Respiratory failure. EXAM: PORTABLE CHEST 1 VIEW COMPARISON:  April 03, 2020 FINDINGS: There is stable tracheostomy tube positioning. Mild, diffuse chronic appearing  increased interstitial lung markings are seen. Mild to moderate severity atelectasis and/or infiltrate is seen within the right lung base. This is similar in severity when compared to the prior study. Marked severity left basilar atelectasis and/or infiltrate is seen and is increased in severity when compared to the  prior exam. There is no evidence of a pleural effusion or pneumothorax. There is mild to moderate severity enlargement of the cardiac silhouette. Degenerative changes are seen throughout the thoracic spine. IMPRESSION: 1. Bibasilar atelectasis and/or infiltrate, left greater than right. Electronically Signed   By: Virgina Norfolk M.D.   On: 04/08/2020 16:45   DG CHEST PORT 1 VIEW  Result Date: 04/03/2020 CLINICAL DATA:  Increasing shortness of breath EXAM: PORTABLE CHEST 1 VIEW COMPARISON:  03/31/2020 FINDINGS: Tracheostomy tube is noted in satisfactory position. Cardiac shadow is enlarged but stable. Some persistent bibasilar opacities are noted although mildly improved when compared with the prior exam. No new focal abnormality is noted. IMPRESSION: Improved aeration as described. Electronically Signed   By: Inez Catalina M.D.   On: 04/03/2020 11:59   DG CHEST PORT 1 VIEW  Result Date: 03/31/2020 CLINICAL DATA:  Pleural effusion EXAM: PORTABLE CHEST 1 VIEW COMPARISON:  03/29/2020 FINDINGS: Tracheostomy unchanged. Lung volumes are small, however, pulmonary insufflation remains stable since prior examination. Bilateral pleural effusions are present small and decreased in size since prior examination. Ovoid opacity within the right mid lung zone likely represents fluid within the fissure. There is bibasilar atelectasis. Cardiac size is within normal limits. No acute bone abnormality. IMPRESSION: Slight interval decrease in small bilateral pleural effusions with associated bibasilar atelectasis. Pulmonary hypoinflation. Electronically Signed   By: Fidela Salisbury MD   On: 03/31/2020 05:41   DG  CHEST PORT 1 VIEW  Result Date: 03/29/2020 CLINICAL DATA:  Dyspnea EXAM: PORTABLE CHEST 1 VIEW COMPARISON:  03/25/2020 FINDINGS: Tracheostomy unchanged. Pulmonary insufflation is symmetric though has decreased slightly since prior examination. Small bilateral pleural effusions have developed with associated bibasilar atelectasis. No pneumothorax. Cardiac size within normal limits. Pulmonary vascularity is normal. No acute bone abnormality. IMPRESSION: Interval development of small bilateral pleural effusions with bibasilar compressive atelectasis. Electronically Signed   By: Fidela Salisbury MD   On: 03/29/2020 06:00   DG CHEST PORT 1 VIEW  Result Date: 03/25/2020 CLINICAL DATA:  Hypoxia, shortness of breath, altered mental status history COPD, hypertension, history laryngeal carcinoma, tracheostomy, undergoing radiation therapy EXAM: PORTABLE CHEST 1 VIEW COMPARISON:  Portable exam 0076 hours compared to 03/23/2020 FINDINGS: Tracheostomy tube projects over tracheal air column. Normal heart size, mediastinal contours, and pulmonary vascularity. Improving opacity at LEFT lung base which could represent atelectasis or consolidation. Subsegmental atelectasis RIGHT middle lobe slightly improved. Tiny LEFT pleural effusion. Remaining lungs clear. No pneumothorax. Bones demineralized. IMPRESSION: Persistent subsegmental atelectasis at RIGHT base with improving atelectasis versus consolidation and tiny pleural effusion at LEFT base. Electronically Signed   By: Lavonia Dana M.D.   On: 03/25/2020 10:33   DG CHEST PORT 1 VIEW  Result Date: 03/23/2020 CLINICAL DATA:  Hypoxia. EXAM: PORTABLE CHEST 1 VIEW COMPARISON:  Left 19 2021. FINDINGS: Interim removal of feeding tube. Tracheostomy tube in stable position. Cardiomegaly. Persistent bibasilar atelectasis/infiltrates and small bilateral pleural effusions. Density noted over the right mid chest may represent atelectasis and or pleural fluid pseudotumor, no interim  change. IMPRESSION: 1. Interim removal of feeding tube. Tracheostomy tube in stable position. 2. Stable cardiomegaly. 3. Persistent bibasilar atelectasis/infiltrates and small bilateral pleural effusions. Density of the right mid chest may represent atelectasis and or pleural fluid pseudotumor, no interim change. Electronically Signed   By: Marcello Moores  Register   On: 03/23/2020 06:39   DG CHEST PORT 1 VIEW  Result Date: 03/19/2020 CLINICAL DATA:  Pneumonia. EXAM: PORTABLE CHEST 1 VIEW COMPARISON:  03/18/2020. FINDINGS: Tracheostomy  tube, feeding tube in stable position. Stable cardiomegaly. Improved pulmonary venous congestion. Lung volumes. Persistent left base atelectasis/infiltrate. Persistent right base subsegmental atelectasis. Elliptical density noted over the right mid chest may represent atelectasis and or fissural fluid. Pleural effusions cannot be excluded. No pneumothorax. IMPRESSION: 1. Lines and tubes in stable position. 2. Stable cardiomegaly. Improved pulmonary venous congestion. 3. Persistent left base atelectasis/infiltrate. Persistent right base subsegmental atelectasis. Elliptical density noted over the right mid chest may represent atelectasis and or fissural fluid. Small pleural effusions cannot be excluded. Electronically Signed   By: Marcello Moores  Register   On: 03/19/2020 06:03   DG Chest Port 1 View  Result Date: 03/18/2020 CLINICAL DATA:  Respiratory failure. EXAM: PORTABLE CHEST 1 VIEW COMPARISON:  03/17/2020. FINDINGS: Tracheostomy tube and feeding tube in stable position. Cardiomegaly. Mild pulmonary venous congestion. Mild bilateral interstitial prominence. Mild component of CHF cannot be excluded. Persistent left lower lobe atelectasis/infiltrate. New onset right mid lung prominent atelectatic changes. No pneumothorax. IMPRESSION: 1. Tracheostomy tube and feeding tube in stable position. 2. Cardiomegaly with mild pulmonary venous congestion and mild bilateral interstitial prominence.  Mild component of CHF cannot be excluded. 3. Persistent left lower lobe atelectasis/infiltrate. New onset of right mid lung prominent atelectatic changes. Electronically Signed   By: Marcello Moores  Register   On: 03/18/2020 07:34   DG CHEST PORT 1 VIEW  Result Date: 03/17/2020 CLINICAL DATA:  Respiratory failure EXAM: PORTABLE CHEST 1 VIEW COMPARISON:  March 14, 2020 FINDINGS: Tracheostomy catheter tip is 6.2 cm above the carina. Feeding tube tip is below the diaphragm. No pneumothorax. There is a small left pleural effusion with consolidation in the left lower lobe. There is right base atelectasis with equivocal small right pleural effusion. Heart is mildly enlarged, stable, with pulmonary vascularity normal. No adenopathy. No bone lesions. IMPRESSION: Tube positions as described without pneumothorax. Left pleural effusion with equivocal right pleural effusion. Airspace opacity consistent with combination of atelectasis and pneumonia left lower lobe. Mild atelectasis right base. Stable cardiac prominence. Electronically Signed   By: Lowella Grip III M.D.   On: 03/17/2020 09:02   DG Abd Portable 1V  Result Date: 04/07/2020 CLINICAL DATA:  Abdomen pain EXAM: PORTABLE ABDOMEN - 1 VIEW COMPARISON:  CT 03/15/2020 FINDINGS: Gastrostomy tube in the left mid abdomen. Nonobstructed bowel-gas pattern. Evidence of prior hernia repair. Curvilinear densities in the right lower quadrant, possible calcifications or retained contrast within appendix. IMPRESSION: Nonobstructed gas pattern. Electronically Signed   By: Donavan Foil M.D.   On: 04/07/2020 21:16   DG Swallowing Func-Speech Pathology  Result Date: 03/27/2020 Objective Swallowing Evaluation: Type of Study: MBS-Modified Barium Swallow Study  Patient Details Name: SHALECE STAFFA MRN: [id number] Date of Birth: 03/27/1961 Today's Date: 03/27/2020 Time: SLP Start Time (ACUTE ONLY): 1215 -SLP Stop Time (ACUTE ONLY): 5329 SLP Time Calculation (min) (ACUTE ONLY):  20 min Past Medical History: Past Medical History: Diagnosis Date  Bronchitis   Class 3 obesity 01/23/2020  COPD (chronic obstructive pulmonary disease) ([place])   Degenerative disc disease, lumbar   Hiatal hernia   Hypertension  Past Surgical History: Past Surgical History: Procedure Laterality Date  CHOLECYSTECTOMY    degenerative bone disease    DIRECT LARYNGOSCOPY N/A 03/13/2020  Procedure: DIRECT LARYNGOSCOPY WITH BIOPSY;  Surgeon: Jason Coop, DO;  Location: [facility];  Service: ENT;  Laterality: N/A;  INCISIONAL HERNIA REPAIR N/A 01/15/2019  Procedure: Dorian Furnace WITH MESH;  Surgeon: [name], MD;  Location: [facility];  Service: General;  Laterality: N/A;  IR GASTROSTOMY TUBE MOD SED  03/22/2020  OMENTECTOMY N/A 01/15/2019  Procedure: OMENTECTOMY;  Surgeon: [name], MD;  Location: [facility];  Service: General;  Laterality: N/A;  TRACHEOSTOMY TUBE PLACEMENT N/A 03/13/2020  Procedure: AWAKE TRACHEOSTOMY;  Surgeon: Jason Coop, DO;  Location: [facility];  Service: ENT;  Laterality: N/A; HPI: Earlene Bjelland is a 60 y/o F with history of COPD,chronic respiratory failure on home oxygen at 5 L/min, with large laryngeal mass lesion, now s/p tracheostomy with Shiley 6-0 and direct laryngoscopy and biopsy. 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  Pt is also s/p PEG for nutrition.  Plans are for chemo and radiation concurrently starting while in hospital.  Prior medical history includes smoking and some dyspnea if walking long distanced.  Subjective: pt awake in chair Assessment / Plan / Recommendation CHL IP CLINICAL IMPRESSIONS 03/27/2020 Clinical Impression Pt continues with with mild pharyngeal phase dysphagia - obstructive due to mass impacting epiglottic deflection.  In addition, she requires extra time to masticate solids due to dentition.  Swallow trigger is timely with minimal laryngeal penetration of liquid into larynx during the swallow due to decreased epiglottic  deflection.  Min vallecular residue with solids without pt awareness - liquid wash assisted to clear.  Pt with prominent cricopharyngeus and appears with trace backflow near this region but view was suboptimal.  Recommend D3/mech soft and thin liquids, po medications whole in puree and follow with liquid wash.  Of note, following testing, pt did cough and slight whitish coating noted on secretions but no aspiration noted during testing.  Suspect pt has some low grade chronic secretion aspiration. SLP Visit Diagnosis Dysphagia, oropharyngeal phase (R13.12) Attention and concentration deficit following -- Frontal lobe and executive function deficit following -- Impact on safety and function Mild aspiration risk   CHL IP TREATMENT RECOMMENDATION 03/27/2020 Treatment Recommendations Therapy as outlined in treatment plan below   Prognosis 03/27/2020 Prognosis for Safe Diet Advancement Fair Barriers to Reach Goals Other (Comment) Barriers/Prognosis Comment -- CHL IP DIET RECOMMENDATION 03/27/2020 SLP Diet Recommendations Dysphagia 3 (Mech soft) solids;Thin liquid Liquid Administration via -- Medication Administration Whole meds with puree Compensations Slow rate;Small sips/bites Postural Changes --   CHL IP OTHER RECOMMENDATIONS 03/27/2020 Recommended Consults (No Data) Oral Care Recommendations Oral care BID Other Recommendations Clarify dietary restrictions   CHL IP FOLLOW UP RECOMMENDATIONS 03/27/2020 Follow up Recommendations Skilled Nursing facility   [facility] IP FREQUENCY AND DURATION 03/27/2020 Speech Therapy Frequency (ACUTE ONLY) min 1 x/week Treatment Duration 1 week      CHL IP ORAL PHASE 03/27/2020 Oral Phase WFL Oral - Pudding Teaspoon -- Oral - Pudding Cup -- Oral - Honey Teaspoon -- Oral - Honey Cup -- Oral - Nectar Teaspoon -- Oral - Nectar Cup -- Oral - Nectar Straw -- Oral - Thin Teaspoon -- Oral - Thin Cup -- Oral - Thin Straw -- Oral - Puree -- Oral - Mech Soft -- Oral - Regular -- Oral - Multi-Consistency  -- Oral - Pill -- Oral Phase - Comment --  CHL IP PHARYNGEAL PHASE 03/27/2020 Pharyngeal Phase -- Pharyngeal- Pudding Teaspoon -- Pharyngeal -- Pharyngeal- Pudding Cup -- Pharyngeal -- Pharyngeal- Honey Teaspoon -- Pharyngeal -- Pharyngeal- Honey Cup -- Pharyngeal -- Pharyngeal- Nectar Teaspoon -- Pharyngeal -- Pharyngeal- Nectar Cup -- Pharyngeal -- Pharyngeal- Nectar Straw Reduced epiglottic inversion;Pharyngeal residue - valleculae Pharyngeal -- Pharyngeal- Thin Teaspoon Reduced epiglottic inversion;Pharyngeal residue - valleculae Pharyngeal -- Pharyngeal- Thin Cup Pharyngeal residue - valleculae;Reduced epiglottic inversion;Penetration/Aspiration during swallow Pharyngeal  Material enters airway, remains ABOVE vocal cords then ejected out Pharyngeal- Thin Straw Reduced epiglottic inversion;Pharyngeal residue - valleculae;Penetration/Aspiration during swallow Pharyngeal Material enters airway, remains ABOVE vocal cords then ejected out Pharyngeal- Puree WFL Pharyngeal -- Pharyngeal- Mechanical Soft -- Pharyngeal -- Pharyngeal- Regular Reduced epiglottic inversion Pharyngeal -- Pharyngeal- Multi-consistency -- Pharyngeal -- Pharyngeal- Pill NT Pharyngeal -- Pharyngeal Comment --  CHL IP CERVICAL ESOPHAGEAL PHASE 03/27/2020 Cervical Esophageal Phase -- Pudding Teaspoon -- Pudding Cup -- Honey Teaspoon -- Honey Cup -- Nectar Teaspoon -- Nectar Cup -- Nectar Straw -- Thin Teaspoon -- Thin Cup Prominent cricopharyngeal segment Thin Straw -- Puree -- Mechanical Soft -- Regular -- Multi-consistency -- Pill -- Cervical Esophageal Comment appearance of potential minimal backflow of liquid near UES region = suboptimal view - did not backflow into pharynx/larynx [name], MS [facility] SLP Acute Rehab Services Office [phone] Pager [phone] [name] 03/27/2020, 2:31 PM              ASSESSMENT AND PLAN:  59 year old Caucasian female, with medical history of heavy smoking, COPD on home oxygen 5 L/min,  hypertension, presents with worsening dyspnea and altered mental status.  Work-up showed a large laryngeal mass.  Patient required urgent tracheostomy.   1.  Supraglottic laryngeal cancer, cT4aN2bMx 2. COPD with acute exacerbation, on chronic home oxygen 5 L/min 3. HTN  4.  Heavy smoking history 5.  Abdominal pain with proctitis noted on CT scan   Recommendations:  -She has T4aN2c supraglottic laryngeal cancer, with compromised airway, required urgent tracheostomy.  -Started concurrent CRT on 03/29/2020.  Tolerating chemotherapy well. Significant response in lymphadenopathy. -Labs reviewed.  She has no neutropenia or thrombocytopenia.  She has mild anemia which is overall stable.  BUN/creatinine improved today. We will continue to monitor this. -She has persistent abdominal pain.  Sigmoidoscopy showed a large rectal ulcer likely a stercoral ulcer and doubt malignancy, biopsies taken.  We will plan to follow-up on these.  GI recommending laxatives and supportive care.  Remains on Cipro and Flagyl. -She has no recurrent fevers.  Blood cultures remain negative.  Sputum culture positive for Serratia marcescens -?colinization.  Serratia sensitive to Cipro. -Hospitalist indicates possible discharge to home later today. -We are trying to arrange for labs, follow-up visit, and possible chemotherapy in our office tomorrow.  We will contact the patient with the date/time.  We will continue on radiation under the care of Dr. Isidore Moos.  Mikey Bussing, DNP, AGPCNP-BC, AOCNP  Patient seen and examined personally.  I agree with that note above.  Patient reports no major complaints at this time.  Patient slightly lethargic.  Her abdominal examination still reveals some discomfort on palpitation.  I recommended continued supportive management and daily radiation and defer resumption of chemotherapy for the time being.

## 2020-04-13 NOTE — TOC Progression Note (Signed)
Transition of Care [facility]) - Progression Note    Patient Details  Name: Veronica Horton MRN: [id number] Date of Birth: 10-Nov-1960  Transition of Care [facility]) CM/SW Contact  [place]na Stegeman, [name], RN Phone Number: 04/13/2020, 11:27 AM  Clinical Narrative: MD place home 02 order & place;oxygen w/liter flow/frequency/route;if humidity needed. [employer] providing all dme.Patient will transport home on own. No further CM needs.     Expected Discharge Plan: Home/Self Care Barriers to Discharge: Continued Medical Work up  Expected Discharge Plan and Services Expected Discharge Plan: Home/Self Care In-house Referral: Financial Counselor Discharge Planning Services: CM Consult Post Acute Care Choice: Durable Medical Equipment                   DME Arranged: Trach supplies DME Agency: [employer] Date DME Agency Contacted: 03/22/20 Time DME Agency Contacted: [phone] Representative spoke with at DME Agency: [email or web address]             Social Determinants of Health ([place]) Interventions    Readmission Risk Interventions Readmission Risk Prevention Plan 01/20/2019  Medication Screening Complete  Transportation Screening Complete  Some recent data might be hidden

## 2020-04-14 ENCOUNTER — Encounter: Payer: Self-pay | Admitting: General Practice

## 2020-04-14 ENCOUNTER — Other Ambulatory Visit (HOSPITAL_COMMUNITY): Payer: Self-pay | Admitting: Hematology and Oncology

## 2020-04-14 ENCOUNTER — Ambulatory Visit
Admit: 2020-04-14 | Discharge: 2020-04-14 | Disposition: A | Discharge status: Home / Self Care | Payer: Medicaid Other | Attending: Radiation Oncology | Admitting: Radiation Oncology

## 2020-04-14 LAB — CBC
HCT: 31.5 % — ABNORMAL LOW (ref 36.0–46.0)
Hemoglobin: 10.2 g/dL — ABNORMAL LOW (ref 12.0–15.0)
MCH: 31.7 pg (ref 26.0–34.0)
MCHC: 32.4 g/dL (ref 30.0–36.0)
MCV: 97.8 fL (ref 80.0–100.0)
Platelets: 238 10*3/uL (ref 150–400)
RBC: 3.22 MIL/uL — ABNORMAL LOW (ref 3.87–5.11)
RDW: 14.8 % (ref 11.5–15.5)
WBC: 5.7 10*3/uL (ref 4.0–10.5)
nRBC: 0 % (ref 0.0–0.2)

## 2020-04-14 LAB — BASIC METABOLIC PANEL
Anion gap: 10 (ref 5–15)
BUN: 15 mg/dL (ref 6–20)
CO2: 26 mmol/L (ref 22–32)
Calcium: 8.2 mg/dL — ABNORMAL LOW (ref 8.9–10.3)
Chloride: 100 mmol/L (ref 98–111)
Creatinine, Ser: 0.96 mg/dL (ref 0.44–1.00)
GFR, Estimated: >60 mL/min (ref >60)
Glucose, Bld: 90 mg/dL (ref 70–99)
Potassium: 4.1 mmol/L (ref 3.5–5.1)
Sodium: 136 mmol/L (ref 135–145)

## 2020-04-14 LAB — GLUCOSE, CAPILLARY
Glucose-Capillary: 101 mg/dL — ABNORMAL HIGH (ref 70–99)
Glucose-Capillary: 108 mg/dL — ABNORMAL HIGH (ref 70–99)
Glucose-Capillary: 127 mg/dL — ABNORMAL HIGH (ref 70–99)
Glucose-Capillary: 148 mg/dL — ABNORMAL HIGH (ref 70–99)
Glucose-Capillary: 165 mg/dL — ABNORMAL HIGH (ref 70–99)
Glucose-Capillary: 79 mg/dL (ref 70–99)

## 2020-04-14 LAB — CULTURE, BLOOD (ROUTINE X 2)
Culture: NO GROWTH
Culture: NO GROWTH
Special Requests: ADEQUATE
Special Requests: ADEQUATE

## 2020-04-14 MED ORDER — COVID-19 MRNA VACC (MODERNA) 100 MCG/0.5ML IM SUSP
0.5000 mL | Freq: Once | INTRAMUSCULAR | Status: AC
  Filled 2020-04-14: qty 0.5

## 2020-04-14 NOTE — Progress Notes (Signed)
Speech Language Pathology Treatment: Dysphagia  Patient Details Name: Veronica Horton MRN: [id number] DOB: 1961/02/29 Today's Date: 04/14/2020 Time: 1015-1106 SLP Time Calculation (min) (ACUTE ONLY): 51 min  Assessment / Plan / Recommendation Clinical Impression  SLP provided pt with empty spiral notebook to use for communication. Pt seen to address her dysphagia goals including therapeutic exercises to improve laryngeal closure by strengthening sublingual musculature.  Pt admits she has been conducting exercises daily.  Pt demonstrated exercise with min cues initially fading to mod I - lingual press x3 seconds x20 repetitions conducted.  Pt advised work level was 9/10 at end of regimen, thus advised her to decrease repetitions with goal of effort 7/10.  She reported understanding and was agreeable to task.  Round bell remains in room for emergent uses- advised RN to pt's use for respiratory emergency and posted signs in room for staff carryover.   Also requested nurse secretary to post sign at desk for staff to know.  Pt reports she is not eating much due to being full from her Tube feeding - advised her to her baseline dysphagia and potential changes with swallowing and immune status with XRT/chemo that will increase her aspiration pna risk.  Observed pt consuming water during exercises without s/s of aspiration.  Viscous secretions coughed from trach prior to significant po administration.  SLP observed pt consuming peaches and water - she placed multiple peaches in her oral cavity without adequately masticating and thus proceeded with overtly cough with face turning red and expectoration of blood tinged secretions through trach.  Highly suspect pt aspirated some peach and hopefully we able to clear.   She advises this happens generally once a day with solids not liquids.  Pt is cognitively intact and advises that she will use extreme caution with solid po - assuring to take small bites/masticating  well and following solids with liquids.  Reviewed risk for aspiration pna being increased due to her level of dysphagia and immune status.  Will follow closely.    HPI HPI: Selin Eisler is a 59 y/o F with history of COPD,chronic respiratory failure on home oxygen at 5 L/min, with large laryngeal mass esion, now s/p tracheostomy with Shiley 6-0 and direct laryngoscopy and biopsy. 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  Pt is also s/p PEG for nutrition.  Plans are for chemo and radiation concurrently starting while in hospital.  Prior medical history includes smoking and some dyspnea if walking long distanced.  Pt underwent MBS today and follow up completed to educate her to recommendations.  She has been on a dys3/thin diet and follow up indicated to provide information and compensations for potential side effects of radiation and compensation strategies.      SLP Plan  Continue with current plan of care       Recommendations  Liquids provided via: Straw;Cup Medication Administration: Via alternative means (or with puree) Supervision: Patient able to self feed Compensations: Slow rate;Small sips/bites;Follow solids with liquid;Other (Comment) (stop intake if coughing!) Postural Changes and/or Swallow Maneuvers: Seated upright 90 degrees;Upright 30-60 min after meal                Oral Care Recommendations: Oral care BID Follow up Recommendations: Home health SLP SLP Visit Diagnosis: Dysphagia, pharyngoesophageal phase (R13.14) Plan: Continue with current plan of care       [place], Jeston Junkins Ann 04/14/2020, 11:25 AM Tammy K,  MS [facility] SLP Acute Rehab Services Office [phone] Pager [phone]

## 2020-04-14 NOTE — Progress Notes (Signed)
Occupational Therapy Treatment Veronica Horton Details Name: Veronica Horton MRN: [id number] DOB: [birth year] Today's Date: 04/14/2020    History of present illness Veronica Horton is a 59 year old female with medical history significant for COPD with chronic hypoxic respiratory failure on 5 L, HTN, tobacco use, obesity class 3 who presented on 11/10 with worsening shortness of breath over the past 4 days and increased confusion. Pt found to be in COPD exacerbation, hospital course at [facility] complicated by dysphasia, pt found to have a laryngeal mass. Pt transferred to [facility]. Pt underwent trach placement on 11/13 s/p layrngeal mass biopsy. 11/22 G-tube. Veronica Horton tolerating dysphagia diet and trach.   OT comments  Veronica Horton is progressing towards goal and POC changed to 1 x a week. Veronica Horton presents on 12 L trach collar and 60% fio2. Veronica Horton mod I for bed mobiilty, supervision for short ambulation around the bed with use use of RW. Veronica Horton independent to stand at sink and perform grooming task - stabilizing her self at sink. Veronica Horton demonstrated ability to manage trach line, leave walker when holding onto furniture and manage telemetry box during grooming task. Veronica Horton also demonstrated ability to bend over and grab item from floor while holding onto sink. Veronica Horton's o2  Sat 98% at end of treatment after activity.    Follow Up Recommendations  Home health OT;Supervision - Intermittent    Equipment Recommendations  3 in 1 bedside commode;Tub/shower seat    Recommendations for Other Services      Precautions / Restrictions Precautions Precaution Comments: trach collar, G-tube Restrictions Weight Bearing Restrictions: No       Mobility Bed Mobility Overal bed mobility: Modified Independent             General bed mobility comments: Mod I - increased time and use of bed rail.  Transfers Overall transfer level: Needs assistance Equipment used: Rolling walker (2 wheeled) Transfers: Sit to/from  [facility] Sit to Stand: Supervision Stand pivot transfers: Supervision       General transfer comment: Supervision to stand and take steps to sink for grooming. Veronica Horton leans on sink as is her baseline. Veronica Horton will leave walker when having something to hold onto (recliner and sink). Demonstrated ability to bend over and pick something up off of the floor with one hand hold.    Balance Overall balance assessment: Mild deficits observed, not formally tested                                         ADL either performed or assessed with clinical judgement   ADL Overall ADL's : Needs assistance/impaired Eating/Feeding: Independent   Grooming: Independent;Wash/dry face;Wash/dry hands;Oral care;Standing Grooming Details (indicate cue type and reason): Veronica Horton independent to stand at sink and perform grooming - oral care and washing face and hands.             Lower Body Dressing: Set up;Supervision/safety;Sit to/from stand Lower Body Dressing Details (indicate cue type and reason): able to don socks - manuevering on bed as needed. Declined donning underwear at this time.                     Vision Veronica Horton Visual Report: No change from baseline     Perception     Praxis      Cognition Arousal/Alertness: Awake/alert Behavior During Therapy: WFL for tasks assessed/performed Overall Cognitive Status: Within Functional Limits for  tasks assessed                                          Exercises     Shoulder Instructions       General Comments      Pertinent Vitals/ Pain       Pain Assessment: Faces Faces Pain Scale: Hurts a little bit Pain Location: botttom Pain Descriptors / Indicators: Grimacing Pain Intervention(s): Monitored during session  Home Living                                          Prior Functioning/Environment              Frequency  Min 1X/week        Progress  Toward Goals  OT Goals(current goals can now be found in the care plan section)  Progress towards OT goals: Progressing toward goals;Goals met and updated - see care plan  Acute Rehab OT Goals Veronica Horton Stated Goal: to go home OT Goal Formulation: With Veronica Horton Time For Goal Achievement: 04/28/20 Potential to Achieve Goals: Good  Plan Discharge plan remains appropriate    Co-evaluation          OT goals addressed during session: ADL's and self-care      AM-PAC OT "6 Clicks" Daily Activity     Outcome Measure   Help from another person eating meals?: None Help from another person taking care of personal grooming?: None Help from another person toileting, which includes using toliet, bedpan, or urinal?: A Little Help from another person bathing (including washing, rinsing, drying)?: A Little Help from another person to put on and taking off regular upper body clothing?: A Little Help from another person to put on and taking off regular lower body clothing?: A Little 6 Click Score: 20    End of Session Equipment Utilized During Treatment: Rolling walker;Oxygen  OT Visit Diagnosis: Unsteadiness on feet (R26.81);Muscle weakness (generalized) (M62.81);Pain   Activity Tolerance Veronica Horton tolerated treatment well   Veronica Horton Left in chair;with call bell/phone within reach   Nurse Communication Mobility status        Time: 7902-4097 OT Time Calculation (min): 16 min  Charges: OT General Charges $OT Visit: 1 Visit OT Treatments $Self Care/Home Management : 8-22 mins  Derl Barrow, OTR/L [place]  Office [phone] Pager: [place] 04/14/2020, 11:23 AM

## 2020-04-14 NOTE — Plan of Care (Signed)
  Problem: Clinical Measurements: Goal: Will remain free from infection Outcome: Progressing   Problem: Clinical Measurements: Goal: Cardiovascular complication will be avoided Outcome: Progressing   

## 2020-04-14 NOTE — TOC Progression Note (Signed)
Transition of Care [facility]) - Progression Note    Patient Details  Name: Veronica Horton MRN: [id number] Date of Birth: December 04, 1960  Transition of Care [facility]) CM/SW Contact  Roslind Michaux, [name], RN Phone Number: 04/14/2020, 12:31 PM  Clinical Narrative:  Patient declines SNF, or any type of facility-no LTACH. D/c plan home w/family. [place]-will have support from sisters. Nsg-instruct sister [name] on trach care prior d/c-Nsg aware.Respiratory following on 02 weaning liter flow. [employer] can manage home 02-up to 8l. All other dme [employer] has orders for home. Family to transport home on own.    Expected Discharge Plan: Home/Self Care Barriers to Discharge: Continued Medical Work up  Expected Discharge Plan and Services Expected Discharge Plan: Home/Self Care In-house Referral: Financial Counselor Discharge Planning Services: CM Consult Post Acute Care Choice: Durable Medical Equipment                   DME Arranged: Oxygen DME Agency: [employer] Date DME Agency Contacted: 04/14/20 Time DME Agency Contacted: 1142 Representative spoke with at DME Agency: [place] Interventions    Readmission Risk Interventions Readmission Risk Prevention Plan 01/20/2019  Medication Screening Complete  Transportation Screening Complete  Some recent data might be hidden

## 2020-04-14 NOTE — Progress Notes (Signed)
Physical Therapy Discharge Patient Details Name: Veronica Horton MRN: [id number] DOB: 08-Aug-1960 Today's Date: 04/14/2020 Time:  -     Patient discharged from PT services secondary to goals met and no further PT needs identified.Patient is able to ambulate with nursing staff.. Plans for DC home. Please see latest therapy progress note for current level of functioning and progress toward goals.    Progress and discharge plan discussed with patient and/or caregiver: pt. Out of room.  GP     Claretha Cooper 04/14/2020, 12:23 PM Tresa Endo PT Acute Rehabilitation Services Pager [phone] Office [phone]

## 2020-04-14 NOTE — Progress Notes (Addendum)
PROGRESS NOTE  FELISA ZECHMAN QMG:[id number] DOB: 09-Oct-1960 DOA: 03/10/2020 PCP: [facility]   LOS: 35 days   Brief narrative: Veronica Horton  is a 59 y.o. female, with medical history significant ofclass III obesity, hiatal hernia, hypertension, bronchitis, COPD, chronic respiratory failure on home oxygen at 5 LPM, active smoker of 1-1/2 packs of cigarettes per day ,  presented to the emergency department with complaint of shortness of breath and altered mental status with worsening dyspnea.  In the ED,chest x-ray was significant for no opacity or volume overload, her ABG was significant for pH of 7.2, PCO2 of 92 for which she was started on BiPAP. She was also started on IV steroids, and IV Lasix given for significant lower extremity edema.   Hospitalization was complicated by finding of laryngeal mass as he underwent biopsy showed invasive squamous cell carcinoma and patient underwent tracheostomy.  Has been at [facility] since 11/17 for radiation therapy, also had PEG tube placed on 11/22.    Underwent flexible sigmoidoscopy on 12/13 due to rectal wall thickening concerning for proctitis seen on CT abdomen and was found to have  a large rectal ulcer presumed to be stercoral ulcer in the setting of constipation.  Her IV antibiotics were discontinued given less concern for infectious proctitis.  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  Status post flex sigmoidoscopy revealed  large rectal ulcer presumed to be stercoral ulcer in setting of constipation/proctitis.    Antibiotics have been discontinued.  Continue MiraLAX to avoid constipation.  Loculated pleural effusion, left base.  Noted on abdominal x-ray.    CT chest formed on 04/13/2020 showed patchy airspace infiltrate with a left upper lobe and a small bilateral pleural effusion with atelectasis.  -Tracheal culture significant for Serratia, unclear clinical significance, previously on IV Cipro for presumed infectious proctitis .  Fever in the setting of likely proctitis, resolved.    Off antibiotic at this time.  Right arm mild erythema and induration.  Will get duplex ultrasound of the right upper extremity.  Supraglottic cell laryngeal cancer, status post tracheostomy and PEG placement.  Patient declined total laryngectomy.  Responding well to chemotherapy.  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 COPD on 5L home O2 Trach collar currently.  Declined a total laryngectomy.  CT scan with mild effusion but no metastatic disease. --Garlon Hatchet and Yuperlri at discharge, she can not use inhalers due to airway occlusion. NO passy-muir valve. She will be seen in trach clinic after chemotherapy for her tumor to see if she can t olerate per PCCM.  Will need routine trach care at home.  Patient's sister is willing to help her on discharge.  Currently trying to set of oxygen at home.  Dyspnea secondary to COPD further exacerbated by laryngeal squamous cell carcinoma as well aspiration pneumonia.  Chest x-ray on 12/12 shows bibasilar atelectasis and right middle lobe atelectasis.  CT scan on 04/13/2020 with  mild effusions and infiltrate.  Continue bronchodilators, supplemental oxygen tracheostomy care.  Will need nebulizers on discharge.  Will need continued trach care at home  AKI, improving.  Baseline creatinine 0.9-1.     Has resolved at this time.  Hypoglycemia episodes.    Improved.  GERD -continue PPI  DVT prophylaxis: SCDs Start: 03/10/20 1625   Code Status: Full code  Family Communication: None today  Status is: Inpatient  Remains inpatient appropriate because:IV treatments appropriate due to intensity of illness or inability to take PO and Inpatient level of care appropriate due to severity of illness   Dispo: The patient is from: Home              Anticipated d/c is to: Home with home health              Anticipated d/c date is: 1 day, doing home health arrangements with humidified oxygen through trach collar.  Spoke at the progression rounds              Patient currently is not medically stable to d/c.   Consultants:  Oncology  ENT  Radiation oncology  GI  Procedures:  Tracheostomy 03/13/2020  flexible sigmoidoscopy and polypectomy on 04/12/2020  PEG tube placement 11/22.  Antibiotics:  . None at this time  Anti-infectives (From admission, onward)   Start     Dose/Rate Route Frequency Ordered Stop   04/09/20 1800  ciprofloxacin (CIPRO) IVPB 400 mg  Status:  Discontinued        400 mg 200 mL/hr over 60 Minutes Intravenous Every 12 hours 04/09/20 1705 04/13/20 1659   04/09/20 1800  metroNIDAZOLE (FLAGYL) tablet 500 mg  Status:  Discontinued        500 mg Per Tube Every 8 hours 04/09/20 1705 04/13/20 1019   03/22/20 1700  vancomycin (VANCOCIN) IVPB 1000 mg/200 mL premix  Status:  Discontinued        1,000 mg 200 mL/hr over 60 Minutes Intravenous  Once 03/22/20 1605 03/23/20 0236   03/22/20 1604  vancomycin (VANCOCIN) 1-5 GM/200ML-% IVPB       Note to Pharmacy: [name]   : cabinet override      03/22/20 1604 03/22/20 1615   03/14/20 1100  levofloxacin (LEVAQUIN) IVPB 750 mg        750 mg 100 mL/hr over 90 Minutes Intravenous Daily 03/14/20 0955 03/19/20 1146   03/10/20 1730  doxycycline (VIBRAMYCIN) 100 mg in sodium chloride 0.9 % 250 mL IVPB  Status:   Discontinued        100 mg 125 mL/hr over 120 Minutes Intravenous Every 12 hours 03/10/20 1634 03/14/20 0955   03/10/20 1600  azithromycin (ZITHROMAX) 500 mg in sodium chloride 0.9 % 250 mL IVPB  Status:  Discontinued        500 mg 250 mL/hr over 60 Minutes Intravenous Every 24 hours 03/10/20 1529 03/10/20 1538   03/10/20 1500  clindamycin (CLEOCIN) IVPB 600 mg  Status:  Discontinued        600 mg 100 mL/hr over 30 Minutes Intravenous  Once 03/10/20 1458 03/10/20 1521     Subjective: Today, patient was seen and examined at bedside.   Denies any chest pain, shortness of breath, fever or chills.  Wishes to go home.  Objective: Vitals:   04/14/20 0724 04/14/20 0746  BP:    Pulse:  74  Resp:  (!) 23  Temp:    SpO2: 90% 92%    Intake/Output Summary (Last 24  hours) at 04/14/2020 0834 Last data filed at 04/13/2020 2330 Gross per 24 hour  Intake --  Output 700 ml  Net -700 ml   Filed Weights   04/11/20 0500 04/12/20 0500 04/14/20 0500  Weight: 118.6 kg 104.7 kg 104.7 kg   Body mass index is 38.41 kg/m.   Physical Exam: GENERAL: Patient is alert awake and oriented. Not in obvious distress.  Obese, on trach collar. HENT: No scleral pallor or icterus. Pupils equally reactive to light. Oral mucosa is moist, obese on trach collar at 11 L/min NECK: is supple, no gross swelling noted. CHEST: Coarse breath sounds noted bilaterally, diminished breath sounds bilaterally. CVS: S1 and S2 heard, no murmur. Regular rate and rhythm.  ABDOMEN: Soft, non-tender, bowel sounds are present.  PEG tube in place. EXTREMITIES: No edema. CNS: Cranial nerves are intact. No focal motor deficits.  Moving extremities. SKIN: warm and dry without rashes.  Data Review: I have personally reviewed the following laboratory data and studies,  CBC: Recent Labs  Lab 04/10/20 0617 04/11/20 0530 04/12/20 0515 04/13/20 0515 04/14/20 0610  WBC 7.6 5.6 5.9 4.8 5.7  HGB 10.3* 10.7* 10.3* 10.4* 10.2*  HCT  32.1* 32.9* 30.7* 32.3* 31.5*  MCV 97.6 97.3 95.9 97.3 97.8  PLT 157 166 189 199 093   Basic Metabolic Panel: Recent Labs  Lab 04/10/20 0617 04/11/20 0530 04/12/20 0515 04/13/20 0515 04/14/20 0610  NA 134* 135 132* 137 136  K 4.4 4.2 4.2 4.0 4.1  CL 99 99 97* 101 100  CO2 28 26 27 26 26   GLUCOSE 101* 91 91 92 90  BUN 23* 21* 21* 17 15  CREATININE 1.21* 1.14* 1.17* 1.05* 0.96  CALCIUM 8.0* 8.3* 7.8* 8.2* 8.2*   Liver Function Tests: Recent Labs  Lab 04/08/20 0517 04/09/20 0830 04/13/20 0515  AST 14* 16 13*  ALT 15 15 12   ALKPHOS 51 54 45  BILITOT 0.5 0.7 0.5  PROT 5.5* 5.6* 5.3*  ALBUMIN 2.6* 2.7* 2.5*   No results for input(s): LIPASE, AMYLASE in the last 168 hours. No results for input(s): AMMONIA in the last 168 hours. Cardiac Enzymes: No results for input(s): CKTOTAL, CKMB, CKMBINDEX, TROPONINI in the last 168 hours. BNP (last 3 results) Recent Labs    01/23/20 0234 03/10/20 1227 03/29/20 0515  BNP 50.0 183.0* 64.6    ProBNP (last 3 results) No results for input(s): PROBNP in the last 8760 hours.  CBG: Recent Labs  Lab 04/13/20 1656 04/13/20 2014 04/14/20 0003 04/14/20 0405 04/14/20 0730  GLUCAP 87 107* 148* 101* 108*   Recent Results (from the past 240 hour(s))  Culture, blood (routine x 2)     Status: None (Preliminary result)   Collection Time: 04/09/20  8:23 AM   Specimen: BLOOD  Result Value Ref Range Status   Specimen Description   Final    BLOOD LEFT ANTECUBITAL Performed at [facility], [place] [address], [place], [place] [address]    Special Requests   Final    BOTTLES DRAWN AEROBIC AND ANAEROBIC Blood Culture adequate volume Performed at  [address] [place], [place] [address]    Culture   Final    NO GROWTH 4 DAYS Performed at [facility], [place] [address], [place], [place] [address]    Report Status PENDING  Incomplete  Culture, blood (routine x 2)     Status: None  (Preliminary result)   Collection Time: 04/09/20  8:30 AM  Specimen: BLOOD  Result Value Ref Range Status   Specimen Description   Final    BLOOD LEFT HAND Performed at [facility] [address], [place], [place] [address]    Special Requests   Final    BOTTLES DRAWN AEROBIC AND ANAEROBIC Blood Culture adequate volume Performed at [facility] [address], [place], [place] [address]    Culture   Final    NO GROWTH 4 DAYS Performed at [facility], [place] [address], [place], [place] [address]    Report Status PENDING  Incomplete  Culture, respiratory (non-expectorated)     Status: None   Collection Time: 04/09/20  3:26 PM   Specimen: Tracheal Aspirate; Respiratory  Result Value Ref Range Status   Specimen Description   Final    TRACHEAL ASPIRATE Performed at [facility] [address] [place], [place] [address]    Special Requests   Final    NONE Performed at [facility], [place] [address], [place], [place] [address]    Gram Stain   Final    NO WBC SEEN RARE GRAM POSITIVE COCCI RARE GRAM NEGATIVE RODS Performed at [facility], [place] [address] [place], [place] [address]    Culture MODERATE SERRATIA MARCESCENS  Final   Report Status 04/12/2020 FINAL  Final   Organism ID, Bacteria SERRATIA MARCESCENS  Final      Susceptibility   Serratia marcescens - MIC*    CEFAZOLIN >=64 RESISTANT Resistant     CEFEPIME <=0.12 SENSITIVE Sensitive     CEFTAZIDIME <=1 SENSITIVE Sensitive     CEFTRIAXONE <=0.25 SENSITIVE Sensitive     CIPROFLOXACIN <=0.25 SENSITIVE Sensitive     GENTAMICIN <=1 SENSITIVE Sensitive     TRIMETH/SULFA <=20 SENSITIVE Sensitive     * MODERATE SERRATIA MARCESCENS     Studies: CT CHEST WO CONTRAST  Result Date: 04/13/2020 CLINICAL DATA:  Pleural effusion EXAM: CT CHEST WITHOUT CONTRAST TECHNIQUE: Multidetector CT imaging of the chest was performed following the  standard protocol without IV contrast. COMPARISON:  03/15/2020 FINDINGS: Cardiovascular: Extensive coronary artery calcification again noted predominantly within the left anterior descending coronary artery. Global cardiac size within normal limits. No pericardial effusion. Central pulmonary arteries are enlarged in keeping with changes of pulmonary arterial hypertension, similar to that noted on prior examination. Mild atherosclerotic calcification is seen within the thoracic aorta. Aberrant right subclavian artery noted. Mediastinum/Nodes: Tracheostomy in expected position. No pathologic thoracic adenopathy. The esophagus is unremarkable. Lungs/Pleura: Patchy pulmonary infiltrate is seen within the a left upper lobe, new since prior examination and likely infectious in the acute setting. Bibasilar atelectasis is present with subtotal collapse of the lower lobes bilaterally. Small bilateral pleural effusions are present, left greater than right. No pneumothorax. No central obstructing lesion. Upper Abdomen: Cholecystectomy has been performed. No acute abnormality. Musculoskeletal: No lytic or blastic bone lesions are seen. Multiple remote left rib fractures are noted no acute bone abnormality. IMPRESSION: Small bilateral pleural effusions with associated bibasilar atelectasis. Patchy airspace infiltrate within the left upper lobe, likely infectious in the acute setting. No central obstructing lesion. Extensive coronary artery calcification. Morphologic changes in keeping with pulmonary arterial hypertension. Aortic Atherosclerosis (ICD10-I70.0). Electronically Signed   By: Fidela Salisbury MD   On: 04/13/2020 20:19   DG Abd Portable 1V  Result Date: 04/13/2020 CLINICAL DATA:  Abdominal pain EXAM: PORTABLE ABDOMEN - 1 VIEW COMPARISON:  CT abdomen and pelvis April 09, 2020; abdominal  radiograph April 07, 2020 FINDINGS: Gastrostomy catheter overlying stomach region. Moderate stool in colon. No bowel dilatation  or air-fluid level to suggest bowel obstruction. No free air. Postoperative change left pelvic region. Loculated effusion left base. IMPRESSION: No bowel obstruction or free air. Moderate stool in colon. Loculated pleural effusion lateral left base. Electronically Signed   By: Lowella Grip III M.D.   On: 04/13/2020 16:14      Flora Lipps, MD  [facility] 04/14/2020  If 7PM-7AM, please contact night-coverage

## 2020-04-14 NOTE — Progress Notes (Signed)
RT NOTE:  Pt O2 increased to 8L/35% due to saturations in the lower 80s while sleeping. Pt now at 92% and comfortable. Will continue to monitor pts oxygen levels and oxygen requirements.

## 2020-04-14 NOTE — Progress Notes (Signed)
[facility] Therapy Treatment Record Phone [phone]   [id number] Therapy was administered to Veronica Horton on: 04/14/2020  11:44 AM and was treatment # 13 out of a planned course of 35 treatments.  Radiation Treatment  1). Beam photons with 6-10 energy  2). Brachytherapy None  3). Stereotactic Radiosurgery None  4). Other Radiation None     Veto Macqueen A Bensen [place], RT (T)

## 2020-04-14 NOTE — Progress Notes (Signed)
RT NOTE:  Pt currently down in radiation, RN to call RT when pt returns.

## 2020-04-14 NOTE — Progress Notes (Signed)
Patient ID: Veronica Horton, female   DOB: 1960-06-03, 59 y.o.   MRN: [id number]  Informed patient of pathology results. Rectal biopsies negative for active inflammation or malignancy. Rectal polyp was hyperplastic. Suspect patient had constipation leading to the rectal ulcer. Reports ongoing loose stools. "Mushy" stools reported yesterday. Continue supportive care. Laxatives prn if constipation develops. Will sign off. Call us back if needed. Dr. Cristina Gong available tomorrow if needed.

## 2020-04-14 NOTE — Progress Notes (Addendum)
HEMATOLOGY-ONCOLOGY PROGRESS NOTE  SUBJECTIVE:    She is doing better. Abdominal pain is improving. She is able to eat. No fevers or chills. Mild nausea and sore throat, no vomiting. No change in breathing.  Rest of the pertinent 10 point ROS reviewed and negative  I have reviewed the past medical history, past surgical history, social history and family history with the patient and they are unchanged from previous note.   PHYSICAL EXAMINATION: ECOG PERFORMANCE STATUS: 2 - Symptomatic, <50% confined to bed  Vitals:   04/14/20 1244 04/14/20 1458  BP:  (!) 112/55  Pulse: 78 65  Resp: 15 15  Temp:    SpO2: 95% 90%   Filed Weights   04/11/20 0500 04/12/20 0500 04/14/20 0500  Weight: 261 lb 7.5 oz (118.6 kg) 230 lb 13.2 oz (104.7 kg) 230 lb 13.2 oz (104.7 kg)    Intake/Output from previous day: 12/14 0800 - 12/15 0759 In: -  Out: 700 [Urine:700]  GENERAL: alert, no distress and comfortable, on trach collar Neck: Significant decrease in the LN  Ant lung fields clear. Trach site with clear mucus Abdomen. PEG site clean , BS normal, no tenderness , guarding or rigidity. BLE venous stasis , no change.  LABORATORY DATA:  I have reviewed the data as listed CMP Latest Ref Rng & Units 04/14/2020 04/13/2020 04/12/2020  Glucose 70 - 99 mg/dL 90 92 91  BUN 6 - 20 mg/dL 15 17 21(H)  Creatinine 0.44 - 1.00 mg/dL 0.96 1.05(H) 1.17(H)  Sodium 135 - 145 mmol/L 136 137 132(L)  Potassium 3.5 - 5.1 mmol/L 4.1 4.0 4.2  Chloride 98 - 111 mmol/L 100 101 97(L)  CO2 22 - 32 mmol/L 26 26 27   Calcium 8.9 - 10.3 mg/dL 8.2(L) 8.2(L) 7.8(L)  Total Protein 6.5 - 8.1 g/dL - 5.3(L) -  Total Bilirubin 0.3 - 1.2 mg/dL - 0.5 -  Alkaline Phos 38 - 126 U/L - 45 -  AST 15 - 41 U/L - 13(L) -  ALT 0 - 44 U/L - 12 -    Lab Results  Component Value Date   WBC 5.7 04/14/2020   HGB 10.2 (L) 04/14/2020   HCT 31.5 (L) 04/14/2020   MCV 97.8 04/14/2020   PLT 238 04/14/2020   NEUTROABS 5.7 04/07/2020     CT ABDOMEN PELVIS WO CONTRAST  Result Date: 04/09/2020 CLINICAL DATA:  Intermittent lower quadrant abdominal pain, diminished bowel movements over past 3 days, fever, history COPD, hypertension, hiatal hernia EXAM: CT ABDOMEN AND PELVIS WITHOUT CONTRAST TECHNIQUE: Multidetector CT imaging of the abdomen and pelvis was performed following the standard protocol without IV contrast. Sagittal and coronal MPR images reconstructed from axial data set. COMPARISON:  03/15/2020 FINDINGS: Lower chest: Bibasilar consolidation, volume loss and pleural effusions Hepatobiliary: Gallbladder surgically absent. Liver normal appearance Pancreas: Atrophic, otherwise unremarkable Spleen: Normal appearance Adrenals/Urinary Tract: LEFT renal cysts unchanged. Adrenal glands, kidneys, ureters, and bladder otherwise normal appearance Stomach/Bowel: Scattered radiopacities within stool. Normal appendix. Gastrostomy tube in stomach. Mild diverticulosis of distal descending and proximal sigmoid colon without evidence of diverticulitis. Significant rectal wall thickening with surrounding perirectal infiltrative changes extending into presacral space favor proctitis though tumor not excluded. Prior ventral hernia repair with recurrent hernia identified at the inferior margin of the repair containing a nonobstructed small bowel loop (see coronal images 81-82). Vascular/Lymphatic: Atherosclerotic calcifications aorta and iliac arteries without aneurysm. No adenopathy. Reproductive: Unremarkable uterus and adnexa Other: Infiltration of fat within small bowel mesentery consistent with fibrosing mesenteritis. No  free air or free fluid. Musculoskeletal: Osseous demineralization. IMPRESSION: Distal colonic diverticulosis without evidence of diverticulitis. Significant rectal wall thickening with surrounding perirectal infiltrative changes extending into presacral space favor proctitis though tumor not excluded; recommend correlation with  proctoscopy. Prior ventral hernia repair with recurrent hernia at the inferior margin of the repair, containing a nonobstructed small bowel loop. Changes of fibrosing mesenteritis. Bibasilar consolidation, volume loss and pleural effusions. Aortic Atherosclerosis (ICD10-I70.0). Electronically Signed   By: Lavonia Dana M.D.   On: 04/09/2020 13:27   CT CHEST WO CONTRAST  Result Date: 04/13/2020 CLINICAL DATA:  Pleural effusion EXAM: CT CHEST WITHOUT CONTRAST TECHNIQUE: Multidetector CT imaging of the chest was performed following the standard protocol without IV contrast. COMPARISON:  03/15/2020 FINDINGS: Cardiovascular: Extensive coronary artery calcification again noted predominantly within the left anterior descending coronary artery. Global cardiac size within normal limits. No pericardial effusion. Central pulmonary arteries are enlarged in keeping with changes of pulmonary arterial hypertension, similar to that noted on prior examination. Mild atherosclerotic calcification is seen within the thoracic aorta. Aberrant right subclavian artery noted. Mediastinum/Nodes: Tracheostomy in expected position. No pathologic thoracic adenopathy. The esophagus is unremarkable. Lungs/Pleura: Patchy pulmonary infiltrate is seen within the a left upper lobe, new since prior examination and likely infectious in the acute setting. Bibasilar atelectasis is present with subtotal collapse of the lower lobes bilaterally. Small bilateral pleural effusions are present, left greater than right. No pneumothorax. No central obstructing lesion. Upper Abdomen: Cholecystectomy has been performed. No acute abnormality. Musculoskeletal: No lytic or blastic bone lesions are seen. Multiple remote left rib fractures are noted no acute bone abnormality. IMPRESSION: Small bilateral pleural effusions with associated bibasilar atelectasis. Patchy airspace infiltrate within the left upper lobe, likely infectious in the acute setting. No central  obstructing lesion. Extensive coronary artery calcification. Morphologic changes in keeping with pulmonary arterial hypertension. Aortic Atherosclerosis (ICD10-I70.0). Electronically Signed   By: Fidela Salisbury MD   On: 04/13/2020 20:19   IR GASTROSTOMY TUBE MOD SED  Result Date: 03/22/2020 INDICATION: Head and neck malignancy, dysphagia EXAM: FLUOROSCOPIC 16 FRENCH BALLOON RETENTION GASTROSTOMY MEDICATIONS: 1 G VANCOMYCIN; Antibiotics were administered within 1 hour of the procedure. GLUCAGON 0.5 MG IV ANESTHESIA/SEDATION: Versed 0 mg IV; Fentanyl 50 mcg IV Moderate Sedation Time:  12 MINUTES The patient was continuously monitored during the procedure by the interventional radiology nurse under my direct supervision. CONTRAST:  10 CC-administered into the gastric lumen. FLUOROSCOPY TIME:  Fluoroscopy Time: 1 minutes 36 seconds (53 mGy). COMPLICATIONS: None immediate. PROCEDURE: Informed written consent was obtained from the patient after a thorough discussion of the procedural risks, benefits and alternatives. All questions were addressed. Maximal Sterile Barrier Technique was utilized including caps, mask, sterile gowns, sterile gloves, sterile drape, hand hygiene and skin antiseptic. A timeout was performed prior to the initiation of the procedure. Previous imaging reviewed. Patient has an existing feeding tube within the stomach. This was utilized to insufflate the stomach with air. The stomach was localized beneath the left subcostal margin in the left abdomen with biplane fluoroscopy. Overlying skin marked. Under sterile conditions and local anesthesia, introducer needle was advanced into the stomach. Contrast injection confirms percutaneous needle access within the stomach. Single T tack deployed for gastropexy. Amplatz guidewire inserted. Tract dilatation performed to insert the 16 French balloon retention gastrostomy. Balloon tip inflated with 7 cc mixture of saline and 1 cc contrast. This was  retracted against the anterior gastric wall. Contrast injection confirms position in the stomach. Images obtained for  documentation. T tacks secured to the skin site. A sterile dressing applied. No immediate complication. Patient tolerated the procedure well. IMPRESSION: Successful fluoroscopic 47 French balloon retention gastrostomy Electronically Signed   By: Jerilynn Mages.  Shick M.D.   On: 03/22/2020 16:53   DG CHEST PORT 1 VIEW  Result Date: 04/11/2020 CLINICAL DATA:  Hypoxia. EXAM: PORTABLE CHEST 1 VIEW COMPARISON:  April 08, 2020. FINDINGS: Stable cardiomediastinal silhouette. Tracheostomy tube is unchanged. No pneumothorax is noted. Stable left pleural effusion is noted with associated left basilar atelectasis or infiltrate. Right midlung subsegmental atelectasis is noted with probable small right pleural effusion. Bony thorax is unremarkable. IMPRESSION: Stable left pleural effusion with associated left basilar atelectasis or infiltrate. Right midlung subsegmental atelectasis is noted with probable small right pleural effusion. Electronically Signed   By: Marijo Conception M.D.   On: 04/11/2020 13:20   DG CHEST PORT 1 VIEW  Result Date: 04/08/2020 CLINICAL DATA:  Respiratory failure. EXAM: PORTABLE CHEST 1 VIEW COMPARISON:  April 03, 2020 FINDINGS: There is stable tracheostomy tube positioning. Mild, diffuse chronic appearing increased interstitial lung markings are seen. Mild to moderate severity atelectasis and/or infiltrate is seen within the right lung base. This is similar in severity when compared to the prior study. Marked severity left basilar atelectasis and/or infiltrate is seen and is increased in severity when compared to the prior exam. There is no evidence of a pleural effusion or pneumothorax. There is mild to moderate severity enlargement of the cardiac silhouette. Degenerative changes are seen throughout the thoracic spine. IMPRESSION: 1. Bibasilar atelectasis and/or infiltrate, left greater  than right. Electronically Signed   By: Virgina Norfolk M.D.   On: 04/08/2020 16:45   DG CHEST PORT 1 VIEW  Result Date: 04/03/2020 CLINICAL DATA:  Increasing shortness of breath EXAM: PORTABLE CHEST 1 VIEW COMPARISON:  03/31/2020 FINDINGS: Tracheostomy tube is noted in satisfactory position. Cardiac shadow is enlarged but stable. Some persistent bibasilar opacities are noted although mildly improved when compared with the prior exam. No new focal abnormality is noted. IMPRESSION: Improved aeration as described. Electronically Signed   By: Inez Catalina M.D.   On: 04/03/2020 11:59   DG CHEST PORT 1 VIEW  Result Date: 03/31/2020 CLINICAL DATA:  Pleural effusion EXAM: PORTABLE CHEST 1 VIEW COMPARISON:  03/29/2020 FINDINGS: Tracheostomy unchanged. Lung volumes are small, however, pulmonary insufflation remains stable since prior examination. Bilateral pleural effusions are present small and decreased in size since prior examination. Ovoid opacity within the right mid lung zone likely represents fluid within the fissure. There is bibasilar atelectasis. Cardiac size is within normal limits. No acute bone abnormality. IMPRESSION: Slight interval decrease in small bilateral pleural effusions with associated bibasilar atelectasis. Pulmonary hypoinflation. Electronically Signed   By: Fidela Salisbury MD   On: 03/31/2020 05:41   DG CHEST PORT 1 VIEW  Result Date: 03/29/2020 CLINICAL DATA:  Dyspnea EXAM: PORTABLE CHEST 1 VIEW COMPARISON:  03/25/2020 FINDINGS: Tracheostomy unchanged. Pulmonary insufflation is symmetric though has decreased slightly since prior examination. Small bilateral pleural effusions have developed with associated bibasilar atelectasis. No pneumothorax. Cardiac size within normal limits. Pulmonary vascularity is normal. No acute bone abnormality. IMPRESSION: Interval development of small bilateral pleural effusions with bibasilar compressive atelectasis. Electronically Signed   By: Fidela Salisbury MD   On: 03/29/2020 06:00   DG CHEST PORT 1 VIEW  Result Date: 03/25/2020 CLINICAL DATA:  Hypoxia, shortness of breath, altered mental status history COPD, hypertension, history laryngeal carcinoma, tracheostomy, undergoing radiation therapy EXAM: PORTABLE  CHEST 1 VIEW COMPARISON:  Portable exam 9326 hours compared to 03/23/2020 FINDINGS: Tracheostomy tube projects over tracheal air column. Normal heart size, mediastinal contours, and pulmonary vascularity. Improving opacity at LEFT lung base which could represent atelectasis or consolidation. Subsegmental atelectasis RIGHT middle lobe slightly improved. Tiny LEFT pleural effusion. Remaining lungs clear. No pneumothorax. Bones demineralized. IMPRESSION: Persistent subsegmental atelectasis at RIGHT base with improving atelectasis versus consolidation and tiny pleural effusion at LEFT base. Electronically Signed   By: Lavonia Dana M.D.   On: 03/25/2020 10:33   DG CHEST PORT 1 VIEW  Result Date: 03/23/2020 CLINICAL DATA:  Hypoxia. EXAM: PORTABLE CHEST 1 VIEW COMPARISON:  Left 19 2021. FINDINGS: Interim removal of feeding tube. Tracheostomy tube in stable position. Cardiomegaly. Persistent bibasilar atelectasis/infiltrates and small bilateral pleural effusions. Density noted over the right mid chest may represent atelectasis and or pleural fluid pseudotumor, no interim change. IMPRESSION: 1. Interim removal of feeding tube. Tracheostomy tube in stable position. 2. Stable cardiomegaly. 3. Persistent bibasilar atelectasis/infiltrates and small bilateral pleural effusions. Density of the right mid chest may represent atelectasis and or pleural fluid pseudotumor, no interim change. Electronically Signed   By: Marcello Moores  Register   On: 03/23/2020 06:39   DG CHEST PORT 1 VIEW  Result Date: 03/19/2020 CLINICAL DATA:  Pneumonia. EXAM: PORTABLE CHEST 1 VIEW COMPARISON:  03/18/2020. FINDINGS: Tracheostomy tube, feeding tube in stable position. Stable  cardiomegaly. Improved pulmonary venous congestion. Lung volumes. Persistent left base atelectasis/infiltrate. Persistent right base subsegmental atelectasis. Elliptical density noted over the right mid chest may represent atelectasis and or fissural fluid. Pleural effusions cannot be excluded. No pneumothorax. IMPRESSION: 1. Lines and tubes in stable position. 2. Stable cardiomegaly. Improved pulmonary venous congestion. 3. Persistent left base atelectasis/infiltrate. Persistent right base subsegmental atelectasis. Elliptical density noted over the right mid chest may represent atelectasis and or fissural fluid. Small pleural effusions cannot be excluded. Electronically Signed   By: Marcello Moores  Register   On: 03/19/2020 06:03   DG Chest Port 1 View  Result Date: 03/18/2020 CLINICAL DATA:  Respiratory failure. EXAM: PORTABLE CHEST 1 VIEW COMPARISON:  03/17/2020. FINDINGS: Tracheostomy tube and feeding tube in stable position. Cardiomegaly. Mild pulmonary venous congestion. Mild bilateral interstitial prominence. Mild component of CHF cannot be excluded. Persistent left lower lobe atelectasis/infiltrate. New onset right mid lung prominent atelectatic changes. No pneumothorax. IMPRESSION: 1. Tracheostomy tube and feeding tube in stable position. 2. Cardiomegaly with mild pulmonary venous congestion and mild bilateral interstitial prominence. Mild component of CHF cannot be excluded. 3. Persistent left lower lobe atelectasis/infiltrate. New onset of right mid lung prominent atelectatic changes. Electronically Signed   By: Marcello Moores  Register   On: 03/18/2020 07:34   DG CHEST PORT 1 VIEW  Result Date: 03/17/2020 CLINICAL DATA:  Respiratory failure EXAM: PORTABLE CHEST 1 VIEW COMPARISON:  March 14, 2020 FINDINGS: Tracheostomy catheter tip is 6.2 cm above the carina. Feeding tube tip is below the diaphragm. No pneumothorax. There is a small left pleural effusion with consolidation in the left lower lobe. There is  right base atelectasis with equivocal small right pleural effusion. Heart is mildly enlarged, stable, with pulmonary vascularity normal. No adenopathy. No bone lesions. IMPRESSION: Tube positions as described without pneumothorax. Left pleural effusion with equivocal right pleural effusion. Airspace opacity consistent with combination of atelectasis and pneumonia left lower lobe. Mild atelectasis right base. Stable cardiac prominence. Electronically Signed   By: Lowella Grip III M.D.   On: 03/17/2020 09:02   DG Abd Portable 1V  Result Date: 04/13/2020 CLINICAL DATA:  Abdominal pain EXAM: PORTABLE ABDOMEN - 1 VIEW COMPARISON:  CT abdomen and pelvis April 09, 2020; abdominal radiograph April 07, 2020 FINDINGS: Gastrostomy catheter overlying stomach region. Moderate stool in colon. No bowel dilatation or air-fluid level to suggest bowel obstruction. No free air. Postoperative change left pelvic region. Loculated effusion left base. IMPRESSION: No bowel obstruction or free air. Moderate stool in colon. Loculated pleural effusion lateral left base. Electronically Signed   By: Lowella Grip III M.D.   On: 04/13/2020 16:14   DG Abd Portable 1V  Result Date: 04/07/2020 CLINICAL DATA:  Abdomen pain EXAM: PORTABLE ABDOMEN - 1 VIEW COMPARISON:  CT 03/15/2020 FINDINGS: Gastrostomy tube in the left mid abdomen. Nonobstructed bowel-gas pattern. Evidence of prior hernia repair. Curvilinear densities in the right lower quadrant, possible calcifications or retained contrast within appendix. IMPRESSION: Nonobstructed gas pattern. Electronically Signed   By: Donavan Foil M.D.   On: 04/07/2020 21:16   DG Swallowing Func-Speech Pathology  Result Date: 03/27/2020 Objective Swallowing Evaluation: Type of Study: MBS-Modified Barium Swallow Study  Patient Details Name: ZAKEYA JUNKER MRN: [id number] Date of Birth: 11/18/1960 Today's Date: 03/27/2020 Time: SLP Start Time (ACUTE ONLY): 7124 -SLP Stop Time (ACUTE  ONLY): 1235 SLP Time Calculation (min) (ACUTE ONLY): 20 min Past Medical History: Past Medical History: Diagnosis Date . Bronchitis  . Class 3 obesity 01/23/2020 . COPD (chronic obstructive pulmonary disease) ([place])  . Degenerative disc disease, lumbar  . Hiatal hernia  . Hypertension  Past Surgical History: Past Surgical History: Procedure Laterality Date . CHOLECYSTECTOMY   . degenerative bone disease   . DIRECT LARYNGOSCOPY N/A 03/13/2020  Procedure: DIRECT LARYNGOSCOPY WITH BIOPSY;  Surgeon: Jason Coop, DO;  Location: [facility];  Service: ENT;  Laterality: N/A; . INCISIONAL HERNIA REPAIR N/A 01/15/2019  Procedure: [name] HERNIORRHAPHY WITH MESH;  Surgeon: [name], MD;  Location: [facility];  Service: General;  Laterality: N/A; . IR GASTROSTOMY TUBE MOD SED  03/22/2020 . OMENTECTOMY N/A 01/15/2019  Procedure: OMENTECTOMY;  Surgeon: [name], MD;  Location: [facility];  Service: General;  Laterality: N/A; . TRACHEOSTOMY TUBE PLACEMENT N/A 03/13/2020  Procedure: AWAKE TRACHEOSTOMY;  Surgeon: Jason Coop, DO;  Location: [facility];  Service: ENT;  Laterality: N/A; HPI: Opal Dinning is a 59 y/o F with history of COPD,chronic respiratory failure on home oxygen at 5 L/min, with large laryngeal mass lesion, now s/p tracheostomy with Shiley 6-0 and direct laryngoscopy and biopsy. 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  Pt is also s/p PEG for nutrition.  Plans are for chemo and radiation concurrently starting while in hospital.  Prior medical history includes smoking and some dyspnea if walking long distanced.  Subjective: pt awake in chair Assessment / Plan / Recommendation CHL IP CLINICAL IMPRESSIONS 03/27/2020 Clinical Impression Pt continues with with mild pharyngeal phase dysphagia - obstructive due to mass impacting epiglottic deflection.  In addition, she requires extra time to masticate solids due to dentition.  Swallow trigger is timely with minimal laryngeal penetration of  liquid into larynx during the swallow due to decreased epiglottic deflection.  Min vallecular residue with solids without pt awareness - liquid wash assisted to clear.  Pt with prominent cricopharyngeus and appears with trace backflow near this region but view was suboptimal.  Recommend D3/mech soft and thin liquids, po medications whole in puree and follow with liquid wash.  Of note, following testing, pt did cough and slight whitish coating noted on secretions but no  aspiration noted during testing.  Suspect pt has some low grade chronic secretion aspiration. SLP Visit Diagnosis Dysphagia, oropharyngeal phase (R13.12) Attention and concentration deficit following -- Frontal lobe and executive function deficit following -- Impact on safety and function Mild aspiration risk   CHL IP TREATMENT RECOMMENDATION 03/27/2020 Treatment Recommendations Therapy as outlined in treatment plan below   Prognosis 03/27/2020 Prognosis for Safe Diet Advancement Fair Barriers to Reach Goals Other (Comment) Barriers/Prognosis Comment -- CHL IP DIET RECOMMENDATION 03/27/2020 SLP Diet Recommendations Dysphagia 3 (Mech soft) solids;Thin liquid Liquid Administration via -- Medication Administration Whole meds with puree Compensations Slow rate;Small sips/bites Postural Changes --   CHL IP OTHER RECOMMENDATIONS 03/27/2020 Recommended Consults (No Data) Oral Care Recommendations Oral care BID Other Recommendations Clarify dietary restrictions   CHL IP FOLLOW UP RECOMMENDATIONS 03/27/2020 Follow up Recommendations Skilled Nursing facility   [facility] IP FREQUENCY AND DURATION 03/27/2020 Speech Therapy Frequency (ACUTE ONLY) min 1 x/week Treatment Duration 1 week      CHL IP ORAL PHASE 03/27/2020 Oral Phase WFL Oral - Pudding Teaspoon -- Oral - Pudding Cup -- Oral - Honey Teaspoon -- Oral - Honey Cup -- Oral - Nectar Teaspoon -- Oral - Nectar Cup -- Oral - Nectar Straw -- Oral - Thin Teaspoon -- Oral - Thin Cup -- Oral - Thin Straw -- Oral - Puree  -- Oral - Mech Soft -- Oral - Regular -- Oral - Multi-Consistency -- Oral - Pill -- Oral Phase - Comment --  CHL IP PHARYNGEAL PHASE 03/27/2020 Pharyngeal Phase -- Pharyngeal- Pudding Teaspoon -- Pharyngeal -- Pharyngeal- Pudding Cup -- Pharyngeal -- Pharyngeal- Honey Teaspoon -- Pharyngeal -- Pharyngeal- Honey Cup -- Pharyngeal -- Pharyngeal- Nectar Teaspoon -- Pharyngeal -- Pharyngeal- Nectar Cup -- Pharyngeal -- Pharyngeal- Nectar Straw Reduced epiglottic inversion;Pharyngeal residue - valleculae Pharyngeal -- Pharyngeal- Thin Teaspoon Reduced epiglottic inversion;Pharyngeal residue - valleculae Pharyngeal -- Pharyngeal- Thin Cup Pharyngeal residue - valleculae;Reduced epiglottic inversion;Penetration/Aspiration during swallow Pharyngeal Material enters airway, remains ABOVE vocal cords then ejected out Pharyngeal- Thin Straw Reduced epiglottic inversion;Pharyngeal residue - valleculae;Penetration/Aspiration during swallow Pharyngeal Material enters airway, remains ABOVE vocal cords then ejected out Pharyngeal- Puree WFL Pharyngeal -- Pharyngeal- Mechanical Soft -- Pharyngeal -- Pharyngeal- Regular Reduced epiglottic inversion Pharyngeal -- Pharyngeal- Multi-consistency -- Pharyngeal -- Pharyngeal- Pill NT Pharyngeal -- Pharyngeal Comment --  CHL IP CERVICAL ESOPHAGEAL PHASE 03/27/2020 Cervical Esophageal Phase -- Pudding Teaspoon -- Pudding Cup -- Honey Teaspoon -- Honey Cup -- Nectar Teaspoon -- Nectar Cup -- Nectar Straw -- Thin Teaspoon -- Thin Cup Prominent cricopharyngeal segment Thin Straw -- Puree -- Mechanical Soft -- Regular -- Multi-consistency -- Pill -- Cervical Esophageal Comment appearance of potential minimal backflow of liquid near UES region = suboptimal view - did not backflow into pharynx/larynx [name], MS [facility] SLP Acute Rehab Services Office [phone] Pager [phone] [name] 03/27/2020, 2:31 PM              ASSESSMENT AND PLAN:  59 year old Caucasian female, with medical  history of heavy smoking, COPD on home oxygen 5 L/min, hypertension, presents with worsening dyspnea and altered mental status.  Work-up showed a large laryngeal mass.  Patient required urgent tracheostomy.   1.  Supraglottic laryngeal cancer, [id number] 2. COPD with acute exacerbation, on chronic home oxygen 5 L/min 3. HTN  4.  Heavy smoking history 5.  Abdominal pain with proctitis noted on CT scan   Recommendations:  -She has T4aN2c supraglottic laryngeal cancer, with compromised airway, required urgent tracheostomy.  -Started  concurrent CRT on 03/29/2020.  Tolerating chemotherapy well. Significant response in lymphadenopathy. -Sigmoidoscopy showed a large rectal ulcer benign, hyperplastic polyp. -GI recommending laxatives and supportive care.   -She has no recurrent fevers.  Blood cultures remain negative.  Sputum culture positive for Serratia marcescens -?colinization. Labs reviewed no major concerns. If she is discharged tomorrow, we have already set FU appointment and labs for Friday for Monday chemo. If she remains in the hospital, we will examine her on Friday. She didn't get chemo this week, but we are hoping to continue from next week.

## 2020-04-14 NOTE — Progress Notes (Signed)
RT NOTE:  Pt ATC lowered to 28%/5L. Pt saturations stayed between 91-95%. Pt states her breathing feels good at this time and she feels comfortable. Vitals are stable at this time, RT will continue to monitor.

## 2020-04-15 ENCOUNTER — Encounter: Payer: Self-pay | Admitting: General Practice

## 2020-04-15 ENCOUNTER — Other Ambulatory Visit (HOSPITAL_COMMUNITY): Payer: Self-pay | Admitting: Hematology and Oncology

## 2020-04-15 ENCOUNTER — Other Ambulatory Visit: Payer: Self-pay | Admitting: *Deleted

## 2020-04-15 ENCOUNTER — Ambulatory Visit
Admit: 2020-04-15 | Discharge: 2020-04-15 | Disposition: A | Discharge status: Home / Self Care | Payer: Medicaid Other | Attending: Radiation Oncology | Admitting: Radiation Oncology

## 2020-04-15 ENCOUNTER — Inpatient Hospital Stay (HOSPITAL_COMMUNITY): Payer: Medicaid Other

## 2020-04-15 DIAGNOSIS — C321 Malignant neoplasm of supraglottis: Secondary | ICD-10-CM

## 2020-04-15 DIAGNOSIS — I82409 Acute embolism and thrombosis of unspecified deep veins of unspecified lower extremity: Secondary | ICD-10-CM

## 2020-04-15 LAB — GLUCOSE, CAPILLARY
Glucose-Capillary: 114 mg/dL — ABNORMAL HIGH (ref 70–99)
Glucose-Capillary: 130 mg/dL — ABNORMAL HIGH (ref 70–99)
Glucose-Capillary: 132 mg/dL — ABNORMAL HIGH (ref 70–99)
Glucose-Capillary: 138 mg/dL — ABNORMAL HIGH (ref 70–99)
Glucose-Capillary: 73 mg/dL (ref 70–99)
Glucose-Capillary: 85 mg/dL (ref 70–99)
Glucose-Capillary: 95 mg/dL (ref 70–99)

## 2020-04-15 MED ORDER — LORAZEPAM 0.5 MG PO TABS: 0.5000 mg | ORAL_TABLET | Freq: Four times a day (QID) | ORAL | 0 refills | Status: DC | PRN

## 2020-04-15 MED ORDER — DEXAMETHASONE 4 MG PO TABS: 8.0000 mg | ORAL_TABLET | Freq: Every day | ORAL | 1 refills | Status: DC

## 2020-04-15 MED ORDER — ONDANSETRON HCL 8 MG PO TABS: 8.0000 mg | ORAL_TABLET | Freq: Two times a day (BID) | ORAL | 1 refills | Status: DC | PRN

## 2020-04-15 MED ORDER — ENOXAPARIN SODIUM 100 MG/ML ~~LOC~~ SOLN
100.0000 mg | Freq: Two times a day (BID) | SUBCUTANEOUS | Status: DC
  Administered 2020-04-15: 10:00:00 100 mg via SUBCUTANEOUS
  Filled 2020-04-15: qty 1

## 2020-04-15 MED ORDER — PROCHLORPERAZINE MALEATE 10 MG PO TABS: 10.0000 mg | ORAL_TABLET | Freq: Four times a day (QID) | ORAL | 1 refills | Status: DC | PRN

## 2020-04-15 MED ORDER — ADULT MULTIVITAMIN LIQUID CH
15.0000 mL | Freq: Every day | ORAL | Status: DC
  Administered 2020-04-16: 15 mL
  Filled 2020-04-15 (×2): qty 15

## 2020-04-15 MED ORDER — LIDOCAINE-PRILOCAINE 2.5-2.5 % EX CREA: TOPICAL_CREAM | CUTANEOUS | 3 refills | Status: DC

## 2020-04-15 MED ORDER — ENOXAPARIN SODIUM 60 MG/0.6ML ~~LOC~~ SOLN
50.0000 mg | SUBCUTANEOUS | Status: DC
  Administered 2020-04-16: 10:00:00 50 mg via SUBCUTANEOUS
  Filled 2020-04-15: qty 0.6

## 2020-04-15 NOTE — Plan of Care (Signed)
  Problem: Clinical Measurements: Goal: Diagnostic test results will improve Outcome: Progressing Goal: Respiratory complications will improve Outcome: Progressing   Problem: Activity: Goal: Risk for activity intolerance will decrease Outcome: Progressing   Problem: Coping: Goal: Level of anxiety will decrease Outcome: Progressing   Problem: Clinical Measurements: Goal: Cardiovascular complication will be avoided Outcome: Adequate for Discharge   Problem: Pain Managment: Goal: General experience of comfort will improve Outcome: Adequate for Discharge

## 2020-04-15 NOTE — Progress Notes (Signed)
Speech Language Pathology Treatment: Dysphagia  Patient Details Name: Veronica Horton MRN: [id number] DOB: [birth year] Today's Date: 04/15/2020 Time: 1905-2001 SLP Time Calculation (min) (ACUTE ONLY): 56 min  Assessment / Plan / Recommendation Clinical Impression  Since plans are for pt to dc home tomorrow final education completed with pt re: her dysphagia/compensation strategies.  SLP also remeasured her mandibular ROM - lip to lip remains 50 mm WFL.  Pt advised this SLP that she had no "choking" episodes simliar to occurence yesterday when consuming peaches.  She advised she was extra careful and recognizes that she sensed she "got choked" and pulmonary risk of aspirating.    Discussed her goals of dysphagia management, of which she desires to eat.  Repeat MBS potential reviewed with pt but together SLP and pt determined if is not necessary at this time as it will likely not change pt's care plan and she is managing her swallowing dysfunction well overall.     Reviewed s/s of asp pna, provided information re: long term effects of radiation, neck and mandibular ROM and massage exercises to conduct DAILY to decrease fibrosis.  Also reviewed with pt lack of sensation of retention in pharynx on her prior MBS thus extra caution needed.    Advised pt to continue po as much as able throughout treatment but if she becomes acutely ill, to use extra caution including decreased po to water only and use tube feeding until resolution of her acute illness.  Maintaining cough/"hock" strength indicated to assure airway clearance.   Advised pt assure she is followed by a dentist due to her poor dentition and monitor her oral health closely.    Pt has made excellent progress in her tolerance of po, understanding of her dysphagia and committment to exercises. Education completed at this point and all information providing in writinig, recommend follow up with OP SLP approximately 3 months after completion of XRT  and chemo to assure pt's tolerating po well and modify exercise regimen as needed.  Thanks for allowing me to help participate in this pt's care plan while she has been hospitalized. She is dedicated to her care plan and has been a delightful patient.   HPI HPI: Veronica Horton is a 59 y/o F with history of COPD,chronic respiratory failure on home oxygen at 5 L/min, with large laryngeal mass esion, now s/p tracheostomy with Shiley 6-0 and direct laryngoscopy and biopsy. 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  Pt is also s/p PEG and has undergone XRT and chemo.  Prior medical history includes smoking and some dyspnea if walking long distanced.  She has been on a dys3/thin diet and follow up indicated to provide information including exercises etc for potential side effects of XRT/chemo.  Pt has been maintained on a dys3/thin diet.  She initally desired her diet to be advanced but has greed to soft/thin diet. She is to dc home tomorrow and communicates that she will eat only soft foods at home. She states she will NOT be home alone.      SLP Plan  All goals met       Recommendations  Diet recommendations: Dysphagia 3 (mechanical soft);Thin liquid Liquids provided via: Straw;Cup Medication Administration: Via alternative means (or with puree) Supervision: Patient able to self feed Compensations: Slow rate;Small sips/bites;Follow solids with liquid;Other (Comment) (stop intake if coughing!) Postural Changes and/or Swallow Maneuvers: Seated upright 90 degrees;Upright 30-60 min after meal  Oral Care Recommendations: Oral care BID Follow up Recommendations: Home health SLP SLP Visit Diagnosis: Dysphagia, pharyngoesophageal phase (R13.14) Plan: All goals met       GO                Veronica Horton 04/15/2020, 8:37 PM  Veronica Lime, MS [facility] SLP Acute Rehab Services Office [phone] Pager [phone]

## 2020-04-15 NOTE — Progress Notes (Signed)
Pt + for R arm DVT per [place] tech.  Notified MD to check results. Veronica Horton

## 2020-04-15 NOTE — TOC Progression Note (Signed)
Transition of Care [facility]) - Progression Note    Patient Details  Name: Veronica Horton MRN: [id number] Date of Birth: 03-01-1961  Transition of Care [facility]) CM/SW Contact  Tammey Deeg, [name], RN Phone Number: 04/15/2020, 12:32 PM  Clinical Narrative:Patient has a nebulizer machine @ home. Noted 02 sats yesterday-qualify for home 02 @ max of 8l-[facility] following.Family to be taught trach care prior d/c.Family to transport home on own.       Expected Discharge Plan: Home/Self Care Barriers to Discharge: Continued Medical Work up  Expected Discharge Plan and Services Expected Discharge Plan: Home/Self Care In-house Referral: Financial Counselor Discharge Planning Services: CM Consult Post Acute Care Choice: Durable Medical Equipment                   DME Arranged: Oxygen DME Agency: [facility] Date DME Agency Contacted: 04/14/20 Time DME Agency Contacted: 1142 Representative spoke with at DME Agency: [place] ([place]) Interventions    Readmission Risk Interventions Readmission Risk Prevention Plan 01/20/2019  Medication Screening Complete  Transportation Screening Complete  Some recent data might be hidden

## 2020-04-15 NOTE — Progress Notes (Addendum)
PROGRESS NOTE  Veronica Horton NKN:[id number] DOB: [birth year] DOA: 03/10/2020 PCP: [facility]   LOS: 36 days   Brief narrative: Veronica Horton  is a 59 y.o. female, with medical history significant ofclass III obesity, hiatal hernia, hypertension, bronchitis, COPD, chronic respiratory failure on home oxygen at 5 LPM, active smoker of 1-1/2 packs of cigarettes per day ,  presented to the emergency department with complaint of shortness of breath and altered mental status with worsening dyspnea.  In the ED,chest x-ray was significant for no opacity or volume overload, her ABG was significant for pH of 7.2, PCO2 of 92 for which she was started on BiPAP. She was also started on IV steroids, and IV Lasix given for significant lower extremity edema.  Hospitalization was complicated by finding of laryngeal mass as he underwent biopsy showed invasive squamous cell carcinoma and patient underwent tracheostomy.  Has been at [facility] since 11/17 for radiation therapy, also had PEG tube placed on 11/22.    Underwent flexible sigmoidoscopy on 12/13 due to rectal wall thickening concerning for proctitis seen on CT abdomen and was found to have  a large rectal ulcer presumed to be stercoral ulcer in the setting of constipation.  Her IV antibiotics were discontinued given less concern for infectious proctitis.  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  Improved..  Status post flex sigmoidoscopy revealed large rectal ulcer presumed to be  stercoral ulcer in setting of constipation/proctitis.     Continue MiraLAX to avoid constipation.  Loculated pleural effusion, left base.     CT chest formed on 04/13/2020 showed patchy airspace infiltrate with a left upper lobe and a small bilateral pleural effusion with atelectasis.  -Tracheal culture significant for Serratia, unclear clinical significance, previously on IV Cipro for presumed infectious proctitis .  No need for further antibiotic.  Fever in the setting of likely proctitis, resolved.    Off antibiotic at this time.  Right arm mild erythema and induration.  Ultrasound of the right upper extremity showed evidence of superficial vein thrombosis in the basilic and cephalic vein which was age-indeterminate.  No deep vein thrombosis.  Will continue supportive care   Supraglottic cell laryngeal cancer, status post tracheostomy and PEG placement.  Patient declined total laryngectomy.  Responding well to chemotherapy.  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 COPD on 5L home O2 Trach collar currently.  Declined total laryngectomy.  CT scan chest with mild effusion but no metastatic disease.  Will consider-Brovana and Yuperlri at discharge, she can not use inhalers due to airway occlusion. NO passy-muir valve. She will be seen in trach clinic after chemotherapy for her tumor to see if she can t olerate per PCCM.  Will need routine trach care at home.  Patient's sister is willing to help her on discharge.  Currently trying to set of oxygen at home including family teaching for trach care.  Dyspnea secondary to COPD further exacerbated by laryngeal squamous cell  carcinoma as well aspiration pneumonia.  Chest x-ray on 12/12 shows bibasilar atelectasis and right middle lobe atelectasis.  CT scan on 04/13/2020 with mild effusions and infiltrate.  Continue bronchodilators, supplemental oxygen  tracheostomy care.  Will need nebulizers on discharge.  Will need continued trach care at home  AKI, improving.  Baseline creatinine 0.9-1.    Has resolved at this time.  Hypoglycemia episodes.    Improved.  GERD -continue PPI  DVT prophylaxis: SCDs Start: 03/10/20 1625   Code Status: Full code  Family Communication:Spoke with the patient's sister [name] on the phone  Status is: Inpatient  Remains inpatient appropriate because:IV treatments appropriate due to intensity of illness or inability to take PO and Inpatient level of care appropriate due to severity of illness   Dispo: The patient is from: Home              Anticipated d/c is to: Home with home health              Anticipated d/c date is: 1 day, doing home health arrangements with humidified oxygen through trach collar.  I again spoke at the progression rounds.              Patient currently is not medically stable to d/c.   Consultants:  Oncology  ENT  Radiation oncology  GI  Procedures:  Tracheostomy 03/13/2020  flexible sigmoidoscopy and polypectomy on 04/12/2020  PEG tube placement 11/22.  Antibiotics:  . None at this time  Anti-infectives (From admission, onward)   Start     Dose/Rate Route Frequency Ordered Stop   04/09/20 1800  ciprofloxacin (CIPRO) IVPB 400 mg  Status:  Discontinued        400 mg 200 mL/hr over 60 Minutes Intravenous Every 12 hours 04/09/20 1705 04/13/20 1659   04/09/20 1800  metroNIDAZOLE (FLAGYL) tablet 500 mg  Status:  Discontinued        500 mg Per Tube Every 8 hours 04/09/20 1705 04/13/20 1019   03/22/20 1700  vancomycin (VANCOCIN) IVPB 1000 mg/200 mL premix  Status:  Discontinued        1,000 mg 200 mL/hr over 60 Minutes Intravenous  Once 03/22/20 1605 03/23/20 0236   03/22/20 1604  vancomycin (VANCOCIN) 1-5 GM/200ML-% IVPB       Note to Pharmacy: [name]   : cabinet override      03/22/20 1604 03/22/20 1615   03/14/20 1100  levofloxacin (LEVAQUIN) IVPB 750 mg         750 mg 100 mL/hr over 90 Minutes Intravenous Daily 03/14/20 0955 03/19/20 1146   03/10/20 1730  doxycycline (VIBRAMYCIN) 100 mg in sodium chloride 0.9 % 250 mL IVPB  Status:  Discontinued        100 mg 125 mL/hr over 120 Minutes Intravenous Every 12 hours 03/10/20 1634 03/14/20 0955   03/10/20 1600  azithromycin (ZITHROMAX) 500 mg in sodium chloride 0.9 % 250 mL IVPB  Status:  Discontinued        500 mg 250 mL/hr over 60 Minutes Intravenous Every 24 hours 03/10/20 1529 03/10/20 1538   03/10/20 1500  clindamycin (CLEOCIN) IVPB 600 mg  Status:  Discontinued        600 mg 100 mL/hr over 30 Minutes Intravenous  Once 03/10/20 1458 03/10/20 1521     Subjective: Today, patient was seen and examined at bedside.  Patient denies any pain, nausea, vomiting. Complains of mild redness of the right upper extremity with pain..    Objective:  Vitals:   04/15/20 0522 04/15/20 0827  BP: (!) 104/53   Pulse: 67   Resp: 18   Temp: 98.6 F (37 C)   SpO2: 93% 94%    Intake/Output Summary (Last 24 hours) at 04/15/2020 1141 Last data filed at 04/15/2020 1027 Gross per 24 hour  Intake 654 ml  Output 400 ml  Net 254 ml   Filed Weights   04/11/20 0500 04/12/20 0500 04/14/20 0500  Weight: 118.6 kg 104.7 kg 104.7 kg   Body mass index is 38.41 kg/m.   Physical Exam:  GENERAL: Patient is alert awake and oriented. Not in obvious distress.  Obese, on trach  HENT: No scleral pallor or icterus. Pupils equally reactive to light. Oral mucosa is moist, obese on trach collar at 8 L/min. NECK: is supple, no gross swelling noted. CHEST: Coarse breath sounds noted bilaterally, diminished breath sounds bilaterally. CVS: S1 and S2 heard, no murmur. Regular rate and rhythm.  ABDOMEN: Soft, non-tender, bowel sounds are present.  PEG tube in place. EXTREMITIES: No edema. CNS: Cranial nerves are intact. No focal motor deficits.  Moving extremities. SKIN: warm and dry without rashes.  Data Review: I have  personally reviewed the following laboratory data and studies,  CBC: Recent Labs  Lab 04/10/20 0617 04/11/20 0530 04/12/20 0515 04/13/20 0515 04/14/20 0610  WBC 7.6 5.6 5.9 4.8 5.7  HGB 10.3* 10.7* 10.3* 10.4* 10.2*  HCT 32.1* 32.9* 30.7* 32.3* 31.5*  MCV 97.6 97.3 95.9 97.3 97.8  PLT 157 166 189 199 716   Basic Metabolic Panel: Recent Labs  Lab 04/10/20 0617 04/11/20 0530 04/12/20 0515 04/13/20 0515 04/14/20 0610  NA 134* 135 132* 137 136  K 4.4 4.2 4.2 4.0 4.1  CL 99 99 97* 101 100  CO2 28 26 27 26 26   GLUCOSE 101* 91 91 92 90  BUN 23* 21* 21* 17 15  CREATININE 1.21* 1.14* 1.17* 1.05* 0.96  CALCIUM 8.0* 8.3* 7.8* 8.2* 8.2*   Liver Function Tests: Recent Labs  Lab 04/09/20 0830 04/13/20 0515  AST 16 13*  ALT 15 12  ALKPHOS 54 45  BILITOT 0.7 0.5  PROT 5.6* 5.3*  ALBUMIN 2.7* 2.5*   No results for input(s): LIPASE, AMYLASE in the last 168 hours. No results for input(s): AMMONIA in the last 168 hours. Cardiac Enzymes: No results for input(s): CKTOTAL, CKMB, CKMBINDEX, TROPONINI in the last 168 hours. BNP (last 3 results) Recent Labs    01/23/20 0234 03/10/20 1227 03/29/20 0515  BNP 50.0 183.0* 64.6    ProBNP (last 3 results) No results for input(s): PROBNP in the last 8760 hours.  CBG: Recent Labs  Lab 04/14/20 1630 04/14/20 2046 04/15/20 0013 04/15/20 0417 04/15/20 0740  GLUCAP 127* 79 138* 85 73   Recent Results (from the past 240 hour(s))  Culture, blood (routine x 2)     Status: None   Collection Time: 04/09/20  8:23 AM   Specimen: BLOOD  Result Value Ref Range Status   Specimen Description   Final    BLOOD LEFT ANTECUBITAL Performed at [facility] [address], [place], [place] [address]    Special Requests   Final    BOTTLES DRAWN AEROBIC AND ANAEROBIC Blood Culture adequate volume Performed at [facility] [address] [place],  [address]    Culture   Final    NO GROWTH 5  DAYS Performed at [facility], [place] [address], [place],  [address]  Report Status 04/14/2020 FINAL  Final  Culture, blood (routine x 2)     Status: None   Collection Time: 04/09/20  8:30 AM   Specimen: BLOOD  Result Value Ref Range Status   Specimen Description   Final    BLOOD LEFT HAND Performed at [facility] [address], [place], [place] [address]    Special Requests   Final    BOTTLES DRAWN AEROBIC AND ANAEROBIC Blood Culture adequate volume Performed at [facility] [address] [place], [place] [address]    Culture   Final    NO GROWTH 5 DAYS Performed at [facility], [place] [address] [place], [place] [address]    Report Status 04/14/2020 FINAL  Final  Culture, respiratory (non-expectorated)     Status: None   Collection Time: 04/09/20  3:26 PM   Specimen: Tracheal Aspirate; Respiratory  Result Value Ref Range Status   Specimen Description   Final    TRACHEAL ASPIRATE Performed at [facility] [address], [place], [place] [address]    Special Requests   Final    NONE Performed at [facility], [place] [address], [place], [place] [address]    Gram Stain   Final    NO WBC SEEN RARE GRAM POSITIVE COCCI RARE GRAM NEGATIVE RODS Performed at [facility], [place] [address], [place], [place] [address]    Culture MODERATE SERRATIA MARCESCENS  Final   Report Status 04/12/2020 FINAL  Final   Organism ID, Bacteria SERRATIA MARCESCENS  Final      Susceptibility   Serratia marcescens - MIC*    CEFAZOLIN >=64 RESISTANT Resistant     CEFEPIME <=0.12 SENSITIVE Sensitive     CEFTAZIDIME <=1 SENSITIVE Sensitive     CEFTRIAXONE <=0.25 SENSITIVE Sensitive     CIPROFLOXACIN <=0.25 SENSITIVE Sensitive     GENTAMICIN <=1 SENSITIVE Sensitive     TRIMETH/SULFA <=20 SENSITIVE Sensitive     * MODERATE SERRATIA MARCESCENS     Studies: CT CHEST WO CONTRAST  Result Date:  04/13/2020 CLINICAL DATA:  Pleural effusion EXAM: CT CHEST WITHOUT CONTRAST TECHNIQUE: Multidetector CT imaging of the chest was performed following the standard protocol without IV contrast. COMPARISON:  03/15/2020 FINDINGS: Cardiovascular: Extensive coronary artery calcification again noted predominantly within the left anterior descending coronary artery. Global cardiac size within normal limits. No pericardial effusion. Central pulmonary arteries are enlarged in keeping with changes of pulmonary arterial hypertension, similar to that noted on prior examination. Mild atherosclerotic calcification is seen within the thoracic aorta. Aberrant right subclavian artery noted. Mediastinum/Nodes: Tracheostomy in expected position. No pathologic thoracic adenopathy. The esophagus is unremarkable. Lungs/Pleura: Patchy pulmonary infiltrate is seen within the a left upper lobe, new since prior examination and likely infectious in the acute setting. Bibasilar atelectasis is present with subtotal collapse of the lower lobes bilaterally. Small bilateral pleural effusions are present, left greater than right. No pneumothorax. No central obstructing lesion. Upper Abdomen: Cholecystectomy has been performed. No acute abnormality. Musculoskeletal: No lytic or blastic bone lesions are seen. Multiple remote left rib fractures are noted no acute bone abnormality. IMPRESSION: Small bilateral pleural effusions with associated bibasilar atelectasis. Patchy airspace infiltrate within the left upper lobe, likely infectious in the acute setting. No central obstructing lesion. Extensive coronary artery calcification. Morphologic changes in keeping with pulmonary arterial hypertension. Aortic Atherosclerosis (ICD10-I70.0). Electronically Signed   By: Fidela Salisbury MD   On: 04/13/2020 20:19  DG Abd Portable 1V  Result Date: 04/13/2020 CLINICAL DATA:  Abdominal pain EXAM: PORTABLE ABDOMEN - 1 VIEW COMPARISON:  CT abdomen and pelvis  April 09, 2020; abdominal radiograph April 07, 2020 FINDINGS: Gastrostomy catheter overlying stomach region. Moderate stool in colon. No bowel dilatation or air-fluid level to suggest bowel obstruction. No free air. Postoperative change left pelvic region. Loculated effusion left base. IMPRESSION: No bowel obstruction or free air. Moderate stool in colon. Loculated pleural effusion lateral left base. Electronically Signed   By: Lowella Grip III M.D.   On: 04/13/2020 16:14   VAS [place] UPPER EXTREMITY VENOUS DUPLEX  Result Date: 04/15/2020 UPPER VENOUS STUDY  Indications: Swelling, and Pain Comparison Study: no prior Performing Technologist: Abram Sander RVS  Examination Guidelines: A complete evaluation includes B-mode imaging, spectral Doppler, color Doppler, and power Doppler as needed of all accessible portions of each vessel. Bilateral testing is considered an integral part of a complete examination. Limited examinations for reoccurring indications may be performed as noted.  Right Findings: +----------+------------+---------+-----------+----------+-----------------+ RIGHT     CompressiblePhasicitySpontaneousProperties     Summary      +----------+------------+---------+-----------+----------+-----------------+ IJV           Full       Yes       Yes                                +----------+------------+---------+-----------+----------+-----------------+ Subclavian    Full       Yes       Yes                                +----------+------------+---------+-----------+----------+-----------------+ Axillary      Full       Yes       Yes                                +----------+------------+---------+-----------+----------+-----------------+ Brachial      Full       Yes       Yes                                +----------+------------+---------+-----------+----------+-----------------+ Radial        Full                                                     +----------+------------+---------+-----------+----------+-----------------+ Ulnar         Full                                                    +----------+------------+---------+-----------+----------+-----------------+ Cephalic      None                                  Age Indeterminate +----------+------------+---------+-----------+----------+-----------------+ Basilic       None  Age Indeterminate +----------+------------+---------+-----------+----------+-----------------+  Summary:  Right: No evidence of deep vein thrombosis in the upper extremity. Findings consistent with age indeterminate superficial vein thrombosis involving the right cephalic vein and right basilic vein.  *See table(s) above for measurements and observations.    Preliminary       Flora Lipps, MD  [facility] 04/15/2020  If 7PM-7AM, please contact night-coverage

## 2020-04-15 NOTE — Progress Notes (Signed)
RT spoke with RN- appears trach change due. PT is being treated for laryngeal mass (currently receiving radiation). MD will need to be consulted about scheduled trach change.

## 2020-04-15 NOTE — Progress Notes (Signed)
Upper extremity venous has been completed.   Results given to Rn. Preliminary results in CV Proc.   Abram Sander 04/15/2020 8:49 AM

## 2020-04-15 NOTE — Progress Notes (Signed)
[facility] CSW Progress Notes  Email from [place], stating she has filed patients claim for disability w [employer] on 04/07/2020.   Edwyna Shell, LCSW Clinical Social Worker Phone:  [phone]

## 2020-04-15 NOTE — Progress Notes (Addendum)
ANTICOAGULATION CONSULT NOTE - Initial Consult  Pharmacy Consult for Lovenox Indication: VTE treatment  Allergies  Allergen Reactions  . Codeine Hives    Reports itching only per RN  . Penicillins Hives    Did it involve swelling of the face/tongue/throat, SOB, or low BP? No Did it involve sudden or severe rash/hives, skin peeling, or any reaction on the inside of your mouth or nose? Yes Did you need to seek medical attention at a hospital or doctor's office? Unknown When did it last happen?Over 10 years If all above answers are "NO", may proceed with cephalosporin use.     Patient Measurements: Height: 5\' 5"  (165.1 cm) Weight: 104.7 kg (230 lb 13.2 oz) IBW/kg (Calculated) : 57 Heparin Dosing Weight:   Vital Signs: Temp: 98.6 F (37 C) (12/16 0522) Temp Source: Oral (12/16 0522) BP: 104/53 (12/16 0522) Pulse Rate: 67 (12/16 0522)  Labs: Recent Labs    04/13/20 0515 04/14/20 0610  HGB 10.4* 10.2*  HCT 32.3* 31.5*  PLT 199 238  CREATININE 1.05* 0.96    Estimated Creatinine Clearance: 74.9 mL/min (by C-G formula based on SCr of 0.96 mg/dL).   Medical History: Past Medical History:  Diagnosis Date  . Bronchitis   . Class 3 obesity 01/23/2020  . COPD (chronic obstructive pulmonary disease) ([place])   . Degenerative disc disease, lumbar   . Hiatal hernia   . Hypertension     Medications:  Scheduled:  . arformoterol  15 mcg Nebulization BID  . bisacodyl  10 mg Rectal Once  . budesonide (PULMICORT) nebulizer solution  0.5 mg Nebulization BID  . chlorhexidine  15 mL Mouth Rinse BID  . COVID-19 mRNA vaccine (Moderna)  0.5 mL Intramuscular ONCE-1600  . enoxaparin (LOVENOX) injection  100 mg Subcutaneous Q12H  . feeding supplement (OSMOLITE 1.5 CAL)  237 mL Per Tube TID BM  . gabapentin  600 mg Oral QHS  . insulin aspart  0-9 Units Subcutaneous Q4H  . ipratropium-albuterol  3 mL Nebulization TID  . ketotifen  1 drop Both Eyes BID  . lactose free nutrition   237 mL Oral Q24H  . lidocaine  1 patch Transdermal Q24H  . mouth rinse  15 mL Mouth Rinse q12n4p  . melatonin  5 mg Oral QHS  . multivitamin with minerals  1 tablet Per Tube Daily  . nicotine  21 mg Transdermal Daily  . pantoprazole sodium  40 mg Per Tube Daily  . scopolamine  1 patch Transdermal Q72H    Assessment: 57 yoF admitted on 11/10 with SOB, confusion, COPD exacerbation.  Today, she is found to have RUE thrombosis.  Pharmacy is consulted to dose Lovenox. She has been on prophylactic Lovenox 0.5 mg/kg daily, last dose on 12/15.  12/16 UE dopplers: age indeterminate superficial vein thrombosis involving the right cephalic vein and right basilic vein.   Today, 04/15/2020: SCr < 1 with CrCl > 30 ml/min. CBC: Hgb remains low/stable at 10.2, Plt WNL No bleeding or complications reported   Goal of Therapy:  Anti-Xa level 0.6-1 units/ml 4hrs after LMWH dose given Monitor platelets by anticoagulation protocol: Yes   Plan:  Lovenox 1 mg/kg (100mg ) SQ q12h Pharmacy to follow up SCr and CBC Follow up long-term anticoagulation plans.  Gretta Arab PharmD, BCPS Clinical Pharmacist [facility] main pharmacy [phone] 04/15/2020 8:56 AM   Addendum: Due to the superficial clot, no need for treatment dose Lovenox.  Plan to resume prophylaxis dosing. Plan: Lovenox 0.5 mg/kg (50 mg) SQ q24h Pharmacy to  sign off notes.  Gretta Arab PharmD, BCPS Clinical Pharmacist [facility] [phone] 04/15/2020 1:37 PM

## 2020-04-15 NOTE — Progress Notes (Signed)
PT is in radiation at this time- RN stated she had suctioned PT prior to departure.

## 2020-04-16 ENCOUNTER — Inpatient Hospital Stay: Payer: Medicaid Other

## 2020-04-16 ENCOUNTER — Ambulatory Visit
Admit: 2020-04-16 | Discharge: 2020-04-16 | Disposition: A | Discharge status: Home / Self Care | Payer: Medicaid Other | Attending: Radiation Oncology | Admitting: Radiation Oncology

## 2020-04-16 ENCOUNTER — Inpatient Hospital Stay: Payer: Medicaid Other | Admitting: Medical

## 2020-04-16 LAB — COMPREHENSIVE METABOLIC PANEL
ALT: 9 U/L (ref 0–44)
AST: 13 U/L — ABNORMAL LOW (ref 15–41)
Albumin: 2.7 g/dL — ABNORMAL LOW (ref 3.5–5.0)
Alkaline Phosphatase: 45 U/L (ref 38–126)
Anion gap: 8 (ref 5–15)
BUN: 17 mg/dL (ref 6–20)
CO2: 29 mmol/L (ref 22–32)
Calcium: 8.3 mg/dL — ABNORMAL LOW (ref 8.9–10.3)
Chloride: 98 mmol/L (ref 98–111)
Creatinine, Ser: 1.09 mg/dL — ABNORMAL HIGH (ref 0.44–1.00)
GFR, Estimated: 58 mL/min — ABNORMAL LOW (ref >60)
Glucose, Bld: 92 mg/dL (ref 70–99)
Potassium: 4.7 mmol/L (ref 3.5–5.1)
Sodium: 135 mmol/L (ref 135–145)
Total Bilirubin: 0.4 mg/dL (ref 0.3–1.2)
Total Protein: 6.1 g/dL — ABNORMAL LOW (ref 6.5–8.1)

## 2020-04-16 LAB — CBC WITH DIFFERENTIAL/PLATELET
Abs Immature Granulocytes: 0.06 10*3/uL (ref 0.00–0.07)
Basophils Absolute: 0 10*3/uL (ref 0.0–0.1)
Basophils Relative: 1 %
Eosinophils Absolute: 0 10*3/uL (ref 0.0–0.5)
Eosinophils Relative: 0 %
HCT: 32.3 % — ABNORMAL LOW (ref 36.0–46.0)
Hemoglobin: 10.5 g/dL — ABNORMAL LOW (ref 12.0–15.0)
Immature Granulocytes: 1 %
Lymphocytes Relative: 10 %
Lymphs Abs: 0.6 10*3/uL — ABNORMAL LOW (ref 0.7–4.0)
MCH: 31.6 pg (ref 26.0–34.0)
MCHC: 32.5 g/dL (ref 30.0–36.0)
MCV: 97.3 fL (ref 80.0–100.0)
Monocytes Absolute: 0.7 10*3/uL (ref 0.1–1.0)
Monocytes Relative: 13 %
Neutro Abs: 4 10*3/uL (ref 1.7–7.7)
Neutrophils Relative %: 75 %
Platelets: 251 10*3/uL (ref 150–400)
RBC: 3.32 MIL/uL — ABNORMAL LOW (ref 3.87–5.11)
RDW: 15.2 % (ref 11.5–15.5)
WBC: 5.4 10*3/uL (ref 4.0–10.5)
nRBC: 0 % (ref 0.0–0.2)

## 2020-04-16 LAB — GLUCOSE, CAPILLARY
Glucose-Capillary: 100 mg/dL — ABNORMAL HIGH (ref 70–99)
Glucose-Capillary: 87 mg/dL (ref 70–99)
Glucose-Capillary: 157 mg/dL — ABNORMAL HIGH (ref 70–99)

## 2020-04-16 LAB — MAGNESIUM: Magnesium: 2.2 mg/dL (ref 1.7–2.4)

## 2020-04-16 MED ORDER — DICLOFENAC SODIUM 1 % EX GEL: 2.0000 g | Freq: Four times a day (QID) | CUTANEOUS | 0 refills | Status: DC | PRN

## 2020-04-16 MED ORDER — OSMOLITE 1.5 CAL PO LIQD
237.0000 mL | Freq: Four times a day (QID) | ORAL | Status: DC
  Filled 2020-04-16: qty 237

## 2020-04-16 MED ORDER — MELATONIN 5 MG PO TABS: 5.0000 mg | ORAL_TABLET | Freq: Every day | ORAL | 2 refills | Status: DC

## 2020-04-16 MED ORDER — ARFORMOTEROL TARTRATE 15 MCG/2ML IN NEBU: 15.0000 ug | INHALATION_SOLUTION | Freq: Two times a day (BID) | RESPIRATORY_TRACT | 2 refills | Status: DC

## 2020-04-16 MED ORDER — GUAIFENESIN-DM 100-10 MG/5ML PO SYRP: 10.0000 mL | ORAL_SOLUTION | ORAL | 0 refills | Status: DC | PRN

## 2020-04-16 MED ORDER — KETOTIFEN FUMARATE 0.025 % OP SOLN: 1.0000 [drp] | Freq: Two times a day (BID) | OPHTHALMIC | 0 refills | Status: DC

## 2020-04-16 MED ORDER — GABAPENTIN 300 MG PO CAPS: 600.0000 mg | ORAL_CAPSULE | Freq: Every day | ORAL | Status: DC

## 2020-04-16 MED ORDER — BOOST PLUS PO LIQD: 237.0000 mL | ORAL | 2 refills | Status: AC

## 2020-04-16 MED ORDER — LIDOCAINE 5 % EX PTCH: 1.0000 | MEDICATED_PATCH | CUTANEOUS | 0 refills | Status: DC

## 2020-04-16 MED ORDER — POLYETHYLENE GLYCOL 3350 17 G PO PACK: 17.0000 g | PACK | Freq: Two times a day (BID) | ORAL | 0 refills | Status: DC | PRN

## 2020-04-16 MED ORDER — DIPHENHYDRAMINE HCL 12.5 MG/5ML PO ELIX: 12.5000 mg | ORAL_SOLUTION | Freq: Four times a day (QID) | ORAL | 0 refills | Status: DC | PRN

## 2020-04-16 MED ORDER — SCOPOLAMINE 1 MG/3DAYS TD PT72: 1.0000 | MEDICATED_PATCH | TRANSDERMAL | 12 refills | Status: DC

## 2020-04-16 MED ORDER — BUDESONIDE 0.5 MG/2ML IN SUSP: 0.5000 mg | Freq: Two times a day (BID) | RESPIRATORY_TRACT | 2 refills | Status: AC

## 2020-04-16 MED ORDER — OXYCODONE HCL 5 MG PO TABS
5.0000 mg | ORAL_TABLET | Freq: Four times a day (QID) | ORAL | 0 refills | Status: DC | PRN
Start: 2020-04-16 — End: 2020-11-29

## 2020-04-16 MED ORDER — BISACODYL 10 MG RE SUPP: 10.0000 mg | Freq: Every day | RECTAL | 0 refills | Status: DC | PRN

## 2020-04-16 MED ORDER — OSMOLITE 1.5 CAL PO LIQD: 237.0000 mL | Freq: Three times a day (TID) | ORAL | 2 refills | Status: AC

## 2020-04-16 MED ORDER — IPRATROPIUM-ALBUTEROL 0.5-2.5 (3) MG/3ML IN SOLN: 3.0000 mL | Freq: Four times a day (QID) | RESPIRATORY_TRACT | 2 refills | Status: DC | PRN

## 2020-04-16 MED ORDER — PANTOPRAZOLE SODIUM 40 MG PO PACK: 40.0000 mg | PACK | Freq: Every day | ORAL | 2 refills | Status: DC

## 2020-04-16 MED ORDER — HYDROXYZINE HCL 10 MG PO TABS: 10.0000 mg | ORAL_TABLET | Freq: Three times a day (TID) | ORAL | 2 refills | Status: DC | PRN

## 2020-04-16 NOTE — Progress Notes (Addendum)
HEMATOLOGY-ONCOLOGY PROGRESS NOTE  SUBJECTIVE:    The patient is feeling better.  Abdominal pain has resolved. She is eating and drinking without difficulty. Denies mucositis, nausea, vomiting. Breathing remains stable. Case management is working on obtaining home equipment for possible discharge in the next 1 to 2 days.  Rest of the pertinent 10 point ROS reviewed and negative  I have reviewed the past medical history, past surgical history, social history and family history with the patient and they are unchanged from previous note.   PHYSICAL EXAMINATION: ECOG PERFORMANCE STATUS: 2 - Symptomatic, <50% confined to bed  Vitals:   04/16/20 0719 04/16/20 0739  BP:    Pulse:  (!) 59  Resp:  16  Temp:    SpO2: 94% 95%   Filed Weights   04/12/20 0500 04/14/20 0500 04/15/20 1500  Weight: 104.7 kg 104.7 kg 116.1 kg    Intake/Output from previous day: 12/16 0701 - 12/17 0700 In: 1194 [P.O.:480; NG/GT:474] Out: 700 [Urine:700]  GENERAL: alert, no distress and comfortable, on trach collar Neck: Significant decrease in the LN  Ant lung fields clear. Trach site with clear mucus Abdomen. PEG site clean , BS normal, no tenderness , guarding or rigidity. BLE venous stasis , no change.  LABORATORY DATA:  I have reviewed the data as listed CMP Latest Ref Rng & Units 04/16/2020 04/14/2020 04/13/2020  Glucose 70 - 99 mg/dL 92 90 92  BUN 6 - 20 mg/dL 17 15 17   Creatinine 0.44 - 1.00 mg/dL 1.09(H) 0.96 1.05(H)  Sodium 135 - 145 mmol/L 135 136 137  Potassium 3.5 - 5.1 mmol/L 4.7 4.1 4.0  Chloride 98 - 111 mmol/L 98 100 101  CO2 22 - 32 mmol/L 29 26 26   Calcium 8.9 - 10.3 mg/dL 8.3(L) 8.2(L) 8.2(L)  Total Protein 6.5 - 8.1 g/dL 6.1(L) - 5.3(L)  Total Bilirubin 0.3 - 1.2 mg/dL 0.4 - 0.5  Alkaline Phos 38 - 126 U/L 45 - 45  AST 15 - 41 U/L 13(L) - 13(L)  ALT 0 - 44 U/L 9 - 12    Lab Results  Component Value Date   WBC 5.4 04/16/2020   HGB 10.5 (L) 04/16/2020   HCT 32.3 (L)  04/16/2020   MCV 97.3 04/16/2020   PLT 251 04/16/2020   NEUTROABS 4.0 04/16/2020    CT ABDOMEN PELVIS WO CONTRAST  Result Date: 04/09/2020 CLINICAL DATA:  Intermittent lower quadrant abdominal pain, diminished bowel movements over past 3 days, fever, history COPD, hypertension, hiatal hernia EXAM: CT ABDOMEN AND PELVIS WITHOUT CONTRAST TECHNIQUE: Multidetector CT imaging of the abdomen and pelvis was performed following the standard protocol without IV contrast. Sagittal and coronal MPR images reconstructed from axial data set. COMPARISON:  03/15/2020 FINDINGS: Lower chest: Bibasilar consolidation, volume loss and pleural effusions Hepatobiliary: Gallbladder surgically absent. Liver normal appearance Pancreas: Atrophic, otherwise unremarkable Spleen: Normal appearance Adrenals/Urinary Tract: LEFT renal cysts unchanged. Adrenal glands, kidneys, ureters, and bladder otherwise normal appearance Stomach/Bowel: Scattered radiopacities within stool. Normal appendix. Gastrostomy tube in stomach. Mild diverticulosis of distal descending and proximal sigmoid colon without evidence of diverticulitis. Significant rectal wall thickening with surrounding perirectal infiltrative changes extending into presacral space favor proctitis though tumor not excluded. Prior ventral hernia repair with recurrent hernia identified at the inferior margin of the repair containing a nonobstructed small bowel loop (see coronal images 81-82). Vascular/Lymphatic: Atherosclerotic calcifications aorta and iliac arteries without aneurysm. No adenopathy. Reproductive: Unremarkable uterus and adnexa Other: Infiltration of fat within small bowel mesentery consistent with  fibrosing mesenteritis. No free air or free fluid. Musculoskeletal: Osseous demineralization. IMPRESSION: Distal colonic diverticulosis without evidence of diverticulitis. Significant rectal wall thickening with surrounding perirectal infiltrative changes extending into  presacral space favor proctitis though tumor not excluded; recommend correlation with proctoscopy. Prior ventral hernia repair with recurrent hernia at the inferior margin of the repair, containing a nonobstructed small bowel loop. Changes of fibrosing mesenteritis. Bibasilar consolidation, volume loss and pleural effusions. Aortic Atherosclerosis (ICD10-I70.0). Electronically Signed   By: Lavonia Dana M.D.   On: 04/09/2020 13:27   CT CHEST WO CONTRAST  Result Date: 04/13/2020 CLINICAL DATA:  Pleural effusion EXAM: CT CHEST WITHOUT CONTRAST TECHNIQUE: Multidetector CT imaging of the chest was performed following the standard protocol without IV contrast. COMPARISON:  03/15/2020 FINDINGS: Cardiovascular: Extensive coronary artery calcification again noted predominantly within the left anterior descending coronary artery. Global cardiac size within normal limits. No pericardial effusion. Central pulmonary arteries are enlarged in keeping with changes of pulmonary arterial hypertension, similar to that noted on prior examination. Mild atherosclerotic calcification is seen within the thoracic aorta. Aberrant right subclavian artery noted. Mediastinum/Nodes: Tracheostomy in expected position. No pathologic thoracic adenopathy. The esophagus is unremarkable. Lungs/Pleura: Patchy pulmonary infiltrate is seen within the a left upper lobe, new since prior examination and likely infectious in the acute setting. Bibasilar atelectasis is present with subtotal collapse of the lower lobes bilaterally. Small bilateral pleural effusions are present, left greater than right. No pneumothorax. No central obstructing lesion. Upper Abdomen: Cholecystectomy has been performed. No acute abnormality. Musculoskeletal: No lytic or blastic bone lesions are seen. Multiple remote left rib fractures are noted no acute bone abnormality. IMPRESSION: Small bilateral pleural effusions with associated bibasilar atelectasis. Patchy airspace  infiltrate within the left upper lobe, likely infectious in the acute setting. No central obstructing lesion. Extensive coronary artery calcification. Morphologic changes in keeping with pulmonary arterial hypertension. Aortic Atherosclerosis (ICD10-I70.0). Electronically Signed   By: Fidela Salisbury MD   On: 04/13/2020 20:19   IR GASTROSTOMY TUBE MOD SED  Result Date: 03/22/2020 INDICATION: Head and neck malignancy, dysphagia EXAM: FLUOROSCOPIC 16 FRENCH BALLOON RETENTION GASTROSTOMY MEDICATIONS: 1 G VANCOMYCIN; Antibiotics were administered within 1 hour of the procedure. GLUCAGON 0.5 MG IV ANESTHESIA/SEDATION: Versed 0 mg IV; Fentanyl 50 mcg IV Moderate Sedation Time:  12 MINUTES The patient was continuously monitored during the procedure by the interventional radiology nurse under my direct supervision. CONTRAST:  10 CC-administered into the gastric lumen. FLUOROSCOPY TIME:  Fluoroscopy Time: 1 minutes 36 seconds (53 mGy). COMPLICATIONS: None immediate. PROCEDURE: Informed written consent was obtained from the patient after a thorough discussion of the procedural risks, benefits and alternatives. All questions were addressed. Maximal Sterile Barrier Technique was utilized including caps, mask, sterile gowns, sterile gloves, sterile drape, hand hygiene and skin antiseptic. A timeout was performed prior to the initiation of the procedure. Previous imaging reviewed. Patient has an existing feeding tube within the stomach. This was utilized to insufflate the stomach with air. The stomach was localized beneath the left subcostal margin in the left abdomen with biplane fluoroscopy. Overlying skin marked. Under sterile conditions and local anesthesia, introducer needle was advanced into the stomach. Contrast injection confirms percutaneous needle access within the stomach. Single T tack deployed for gastropexy. Amplatz guidewire inserted. Tract dilatation performed to insert the 16 French balloon retention  gastrostomy. Balloon tip inflated with 7 cc mixture of saline and 1 cc contrast. This was retracted against the anterior gastric wall. Contrast injection confirms position in the stomach.  Images obtained for documentation. T tacks secured to the skin site. A sterile dressing applied. No immediate complication. Patient tolerated the procedure well. IMPRESSION: Successful fluoroscopic 69 French balloon retention gastrostomy Electronically Signed   By: Jerilynn Mages.  Shick M.D.   On: 03/22/2020 16:53   DG CHEST PORT 1 VIEW  Result Date: 04/11/2020 CLINICAL DATA:  Hypoxia. EXAM: PORTABLE CHEST 1 VIEW COMPARISON:  April 08, 2020. FINDINGS: Stable cardiomediastinal silhouette. Tracheostomy tube is unchanged. No pneumothorax is noted. Stable left pleural effusion is noted with associated left basilar atelectasis or infiltrate. Right midlung subsegmental atelectasis is noted with probable small right pleural effusion. Bony thorax is unremarkable. IMPRESSION: Stable left pleural effusion with associated left basilar atelectasis or infiltrate. Right midlung subsegmental atelectasis is noted with probable small right pleural effusion. Electronically Signed   By: Marijo Conception M.D.   On: 04/11/2020 13:20   DG CHEST PORT 1 VIEW  Result Date: 04/08/2020 CLINICAL DATA:  Respiratory failure. EXAM: PORTABLE CHEST 1 VIEW COMPARISON:  April 03, 2020 FINDINGS: There is stable tracheostomy tube positioning. Mild, diffuse chronic appearing increased interstitial lung markings are seen. Mild to moderate severity atelectasis and/or infiltrate is seen within the right lung base. This is similar in severity when compared to the prior study. Marked severity left basilar atelectasis and/or infiltrate is seen and is increased in severity when compared to the prior exam. There is no evidence of a pleural effusion or pneumothorax. There is mild to moderate severity enlargement of the cardiac silhouette. Degenerative changes are seen  throughout the thoracic spine. IMPRESSION: 1. Bibasilar atelectasis and/or infiltrate, left greater than right. Electronically Signed   By: Virgina Norfolk M.D.   On: 04/08/2020 16:45   DG CHEST PORT 1 VIEW  Result Date: 04/03/2020 CLINICAL DATA:  Increasing shortness of breath EXAM: PORTABLE CHEST 1 VIEW COMPARISON:  03/31/2020 FINDINGS: Tracheostomy tube is noted in satisfactory position. Cardiac shadow is enlarged but stable. Some persistent bibasilar opacities are noted although mildly improved when compared with the prior exam. No new focal abnormality is noted. IMPRESSION: Improved aeration as described. Electronically Signed   By: Inez Catalina M.D.   On: 04/03/2020 11:59   DG CHEST PORT 1 VIEW  Result Date: 03/31/2020 CLINICAL DATA:  Pleural effusion EXAM: PORTABLE CHEST 1 VIEW COMPARISON:  03/29/2020 FINDINGS: Tracheostomy unchanged. Lung volumes are small, however, pulmonary insufflation remains stable since prior examination. Bilateral pleural effusions are present small and decreased in size since prior examination. Ovoid opacity within the right mid lung zone likely represents fluid within the fissure. There is bibasilar atelectasis. Cardiac size is within normal limits. No acute bone abnormality. IMPRESSION: Slight interval decrease in small bilateral pleural effusions with associated bibasilar atelectasis. Pulmonary hypoinflation. Electronically Signed   By: Fidela Salisbury MD   On: 03/31/2020 05:41   DG CHEST PORT 1 VIEW  Result Date: 03/29/2020 CLINICAL DATA:  Dyspnea EXAM: PORTABLE CHEST 1 VIEW COMPARISON:  03/25/2020 FINDINGS: Tracheostomy unchanged. Pulmonary insufflation is symmetric though has decreased slightly since prior examination. Small bilateral pleural effusions have developed with associated bibasilar atelectasis. No pneumothorax. Cardiac size within normal limits. Pulmonary vascularity is normal. No acute bone abnormality. IMPRESSION: Interval development of small  bilateral pleural effusions with bibasilar compressive atelectasis. Electronically Signed   By: Fidela Salisbury MD   On: 03/29/2020 06:00   DG CHEST PORT 1 VIEW  Result Date: 03/25/2020 CLINICAL DATA:  Hypoxia, shortness of breath, altered mental status history COPD, hypertension, history laryngeal carcinoma, tracheostomy, undergoing radiation  therapy EXAM: PORTABLE CHEST 1 VIEW COMPARISON:  Portable exam 8588 hours compared to 03/23/2020 FINDINGS: Tracheostomy tube projects over tracheal air column. Normal heart size, mediastinal contours, and pulmonary vascularity. Improving opacity at LEFT lung base which could represent atelectasis or consolidation. Subsegmental atelectasis RIGHT middle lobe slightly improved. Tiny LEFT pleural effusion. Remaining lungs clear. No pneumothorax. Bones demineralized. IMPRESSION: Persistent subsegmental atelectasis at RIGHT base with improving atelectasis versus consolidation and tiny pleural effusion at LEFT base. Electronically Signed   By: Lavonia Dana M.D.   On: 03/25/2020 10:33   DG CHEST PORT 1 VIEW  Result Date: 03/23/2020 CLINICAL DATA:  Hypoxia. EXAM: PORTABLE CHEST 1 VIEW COMPARISON:  Left 19 2021. FINDINGS: Interim removal of feeding tube. Tracheostomy tube in stable position. Cardiomegaly. Persistent bibasilar atelectasis/infiltrates and small bilateral pleural effusions. Density noted over the right mid chest may represent atelectasis and or pleural fluid pseudotumor, no interim change. IMPRESSION: 1. Interim removal of feeding tube. Tracheostomy tube in stable position. 2. Stable cardiomegaly. 3. Persistent bibasilar atelectasis/infiltrates and small bilateral pleural effusions. Density of the right mid chest may represent atelectasis and or pleural fluid pseudotumor, no interim change. Electronically Signed   By: Marcello Moores  Register   On: 03/23/2020 06:39   DG CHEST PORT 1 VIEW  Result Date: 03/19/2020 CLINICAL DATA:  Pneumonia. EXAM: PORTABLE CHEST 1 VIEW  COMPARISON:  03/18/2020. FINDINGS: Tracheostomy tube, feeding tube in stable position. Stable cardiomegaly. Improved pulmonary venous congestion. Lung volumes. Persistent left base atelectasis/infiltrate. Persistent right base subsegmental atelectasis. Elliptical density noted over the right mid chest may represent atelectasis and or fissural fluid. Pleural effusions cannot be excluded. No pneumothorax. IMPRESSION: 1. Lines and tubes in stable position. 2. Stable cardiomegaly. Improved pulmonary venous congestion. 3. Persistent left base atelectasis/infiltrate. Persistent right base subsegmental atelectasis. Elliptical density noted over the right mid chest may represent atelectasis and or fissural fluid. Small pleural effusions cannot be excluded. Electronically Signed   By: Marcello Moores  Register   On: 03/19/2020 06:03   DG Chest Port 1 View  Result Date: 03/18/2020 CLINICAL DATA:  Respiratory failure. EXAM: PORTABLE CHEST 1 VIEW COMPARISON:  03/17/2020. FINDINGS: Tracheostomy tube and feeding tube in stable position. Cardiomegaly. Mild pulmonary venous congestion. Mild bilateral interstitial prominence. Mild component of CHF cannot be excluded. Persistent left lower lobe atelectasis/infiltrate. New onset right mid lung prominent atelectatic changes. No pneumothorax. IMPRESSION: 1. Tracheostomy tube and feeding tube in stable position. 2. Cardiomegaly with mild pulmonary venous congestion and mild bilateral interstitial prominence. Mild component of CHF cannot be excluded. 3. Persistent left lower lobe atelectasis/infiltrate. New onset of right mid lung prominent atelectatic changes. Electronically Signed   By: Marcello Moores  Register   On: 03/18/2020 07:34   DG CHEST PORT 1 VIEW  Result Date: 03/17/2020 CLINICAL DATA:  Respiratory failure EXAM: PORTABLE CHEST 1 VIEW COMPARISON:  March 14, 2020 FINDINGS: Tracheostomy catheter tip is 6.2 cm above the carina. Feeding tube tip is below the diaphragm. No pneumothorax.  There is a small left pleural effusion with consolidation in the left lower lobe. There is right base atelectasis with equivocal small right pleural effusion. Heart is mildly enlarged, stable, with pulmonary vascularity normal. No adenopathy. No bone lesions. IMPRESSION: Tube positions as described without pneumothorax. Left pleural effusion with equivocal right pleural effusion. Airspace opacity consistent with combination of atelectasis and pneumonia left lower lobe. Mild atelectasis right base. Stable cardiac prominence. Electronically Signed   By: Lowella Grip III M.D.   On: 03/17/2020 09:02   DG Abd  Portable 1V  Result Date: 04/13/2020 CLINICAL DATA:  Abdominal pain EXAM: PORTABLE ABDOMEN - 1 VIEW COMPARISON:  CT abdomen and pelvis April 09, 2020; abdominal radiograph April 07, 2020 FINDINGS: Gastrostomy catheter overlying stomach region. Moderate stool in colon. No bowel dilatation or air-fluid level to suggest bowel obstruction. No free air. Postoperative change left pelvic region. Loculated effusion left base. IMPRESSION: No bowel obstruction or free air. Moderate stool in colon. Loculated pleural effusion lateral left base. Electronically Signed   By: Lowella Grip III M.D.   On: 04/13/2020 16:14   DG Abd Portable 1V  Result Date: 04/07/2020 CLINICAL DATA:  Abdomen pain EXAM: PORTABLE ABDOMEN - 1 VIEW COMPARISON:  CT 03/15/2020 FINDINGS: Gastrostomy tube in the left mid abdomen. Nonobstructed bowel-gas pattern. Evidence of prior hernia repair. Curvilinear densities in the right lower quadrant, possible calcifications or retained contrast within appendix. IMPRESSION: Nonobstructed gas pattern. Electronically Signed   By: Donavan Foil M.D.   On: 04/07/2020 21:16   DG Swallowing Func-Speech Pathology  Result Date: 03/27/2020 Objective Swallowing Evaluation: Type of Study: MBS-Modified Barium Swallow Study  Patient Details Name: Veronica Horton MRN: [id number] Date of Birth:  09/02/1960 Today's Date: 03/27/2020 Time: SLP Start Time (ACUTE ONLY): 1027 -SLP Stop Time (ACUTE ONLY): 1235 SLP Time Calculation (min) (ACUTE ONLY): 20 min Past Medical History: Past Medical History: Diagnosis Date . Bronchitis  . Class 3 obesity 01/23/2020 . COPD (chronic obstructive pulmonary disease) ([place])  . Degenerative disc disease, lumbar  . Hiatal hernia  . Hypertension  Past Surgical History: Past Surgical History: Procedure Laterality Date . CHOLECYSTECTOMY   . degenerative bone disease   . DIRECT LARYNGOSCOPY N/A 03/13/2020  Procedure: DIRECT LARYNGOSCOPY WITH BIOPSY;  Surgeon: Jason Coop, DO;  Location: [facility] [facility];  Service: ENT;  Laterality: N/A; . INCISIONAL HERNIA REPAIR N/A 01/15/2019  Procedure: [name] HERNIORRHAPHY WITH MESH;  Surgeon: [name], MD;  Location: [facility];  Service: General;  Laterality: N/A; . IR GASTROSTOMY TUBE MOD SED  03/22/2020 . OMENTECTOMY N/A 01/15/2019  Procedure: OMENTECTOMY;  Surgeon: [name], MD;  Location: [facility];  Service: General;  Laterality: N/A; . TRACHEOSTOMY TUBE PLACEMENT N/A 03/13/2020  Procedure: AWAKE TRACHEOSTOMY;  Surgeon: Jason Coop, DO;  Location: [facility];  Service: ENT;  Laterality: N/A; HPI: Veronica Horton is a 59 y/o F with history of COPD,chronic respiratory failure on home oxygen at 5 L/min, with large laryngeal mass lesion, now s/p tracheostomy with Shiley 6-0 and direct laryngoscopy and biopsy. 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  Pt is also s/p PEG for nutrition.  Plans are for chemo and radiation concurrently starting while in hospital.  Prior medical history includes smoking and some dyspnea if walking long distanced.  Subjective: pt awake in chair Assessment / Plan / Recommendation CHL IP CLINICAL IMPRESSIONS 03/27/2020 Clinical Impression Pt continues with with mild pharyngeal phase dysphagia - obstructive due to mass impacting epiglottic deflection.  In addition, she requires extra time to  masticate solids due to dentition.  Swallow trigger is timely with minimal laryngeal penetration of liquid into larynx during the swallow due to decreased epiglottic deflection.  Min vallecular residue with solids without pt awareness - liquid wash assisted to clear.  Pt with prominent cricopharyngeus and appears with trace backflow near this region but view was suboptimal.  Recommend D3/mech soft and thin liquids, po medications whole in puree and follow with liquid wash.  Of note, following testing, pt did cough and slight whitish coating noted on  secretions but no aspiration noted during testing.  Suspect pt has some low grade chronic secretion aspiration. SLP Visit Diagnosis Dysphagia, oropharyngeal phase (R13.12) Attention and concentration deficit following -- Frontal lobe and executive function deficit following -- Impact on safety and function Mild aspiration risk   CHL IP TREATMENT RECOMMENDATION 03/27/2020 Treatment Recommendations Therapy as outlined in treatment plan below   Prognosis 03/27/2020 Prognosis for Safe Diet Advancement Fair Barriers to Reach Goals Other (Comment) Barriers/Prognosis Comment -- CHL IP DIET RECOMMENDATION 03/27/2020 SLP Diet Recommendations Dysphagia 3 (Mech soft) solids;Thin liquid Liquid Administration via -- Medication Administration Whole meds with puree Compensations Slow rate;Small sips/bites Postural Changes --   CHL IP OTHER RECOMMENDATIONS 03/27/2020 Recommended Consults (No Data) Oral Care Recommendations Oral care BID Other Recommendations Clarify dietary restrictions   CHL IP FOLLOW UP RECOMMENDATIONS 03/27/2020 Follow up Recommendations Skilled Nursing facility   [facility] IP FREQUENCY AND DURATION 03/27/2020 Speech Therapy Frequency (ACUTE ONLY) min 1 x/week Treatment Duration 1 week      CHL IP ORAL PHASE 03/27/2020 Oral Phase WFL Oral - Pudding Teaspoon -- Oral - Pudding Cup -- Oral - Honey Teaspoon -- Oral - Honey Cup -- Oral - Nectar Teaspoon -- Oral - Nectar Cup --  Oral - Nectar Straw -- Oral - Thin Teaspoon -- Oral - Thin Cup -- Oral - Thin Straw -- Oral - Puree -- Oral - Mech Soft -- Oral - Regular -- Oral - Multi-Consistency -- Oral - Pill -- Oral Phase - Comment --  CHL IP PHARYNGEAL PHASE 03/27/2020 Pharyngeal Phase -- Pharyngeal- Pudding Teaspoon -- Pharyngeal -- Pharyngeal- Pudding Cup -- Pharyngeal -- Pharyngeal- Honey Teaspoon -- Pharyngeal -- Pharyngeal- Honey Cup -- Pharyngeal -- Pharyngeal- Nectar Teaspoon -- Pharyngeal -- Pharyngeal- Nectar Cup -- Pharyngeal -- Pharyngeal- Nectar Straw Reduced epiglottic inversion;Pharyngeal residue - valleculae Pharyngeal -- Pharyngeal- Thin Teaspoon Reduced epiglottic inversion;Pharyngeal residue - valleculae Pharyngeal -- Pharyngeal- Thin Cup Pharyngeal residue - valleculae;Reduced epiglottic inversion;Penetration/Aspiration during swallow Pharyngeal Material enters airway, remains ABOVE vocal cords then ejected out Pharyngeal- Thin Straw Reduced epiglottic inversion;Pharyngeal residue - valleculae;Penetration/Aspiration during swallow Pharyngeal Material enters airway, remains ABOVE vocal cords then ejected out Pharyngeal- Puree WFL Pharyngeal -- Pharyngeal- Mechanical Soft -- Pharyngeal -- Pharyngeal- Regular Reduced epiglottic inversion Pharyngeal -- Pharyngeal- Multi-consistency -- Pharyngeal -- Pharyngeal- Pill NT Pharyngeal -- Pharyngeal Comment --  CHL IP CERVICAL ESOPHAGEAL PHASE 03/27/2020 Cervical Esophageal Phase -- Pudding Teaspoon -- Pudding Cup -- Honey Teaspoon -- Honey Cup -- Nectar Teaspoon -- Nectar Cup -- Nectar Straw -- Thin Teaspoon -- Thin Cup Prominent cricopharyngeal segment Thin Straw -- Puree -- Mechanical Soft -- Regular -- Multi-consistency -- Pill -- Cervical Esophageal Comment appearance of potential minimal backflow of liquid near UES region = suboptimal view - did not backflow into pharynx/larynx Veronica Lime, MS [facility] SLP Acute Rehab Services Office [phone] Pager [phone] [name]  03/27/2020, 2:31 PM              VAS [place] UPPER EXTREMITY VENOUS DUPLEX  Result Date: 04/15/2020 UPPER VENOUS STUDY  Indications: Swelling, and Pain Comparison Study: no prior Performing Technologist: Abram Sander RVS  Examination Guidelines: A complete evaluation includes B-mode imaging, spectral Doppler, color Doppler, and power Doppler as needed of all accessible portions of each vessel. Bilateral testing is considered an integral part of a complete examination. Limited examinations for reoccurring indications may be performed as noted.  Right Findings: +----------+------------+---------+-----------+----------+-----------------+ RIGHT     CompressiblePhasicitySpontaneousProperties     Summary      +----------+------------+---------+-----------+----------+-----------------+  IJV           Full       Yes       Yes                                +----------+------------+---------+-----------+----------+-----------------+ Subclavian    Full       Yes       Yes                                +----------+------------+---------+-----------+----------+-----------------+ Axillary      Full       Yes       Yes                                +----------+------------+---------+-----------+----------+-----------------+ Brachial      Full       Yes       Yes                                +----------+------------+---------+-----------+----------+-----------------+ Radial        Full                                                    +----------+------------+---------+-----------+----------+-----------------+ Ulnar         Full                                                    +----------+------------+---------+-----------+----------+-----------------+ Cephalic      None                                  Age Indeterminate +----------+------------+---------+-----------+----------+-----------------+ Basilic       None                                  Age  Indeterminate +----------+------------+---------+-----------+----------+-----------------+  Summary:  Right: No evidence of deep vein thrombosis in the upper extremity. Findings consistent with age indeterminate superficial vein thrombosis involving the right cephalic vein and right basilic vein.  *See table(s) above for measurements and observations.    Preliminary    ASSESSMENT AND PLAN:  59 year old Caucasian female, with medical history of heavy smoking, COPD on home oxygen 5 L/min, hypertension, presents with worsening dyspnea and altered mental status.  Work-up showed a large laryngeal mass.  Patient required urgent tracheostomy.   1.  Supraglottic laryngeal cancer, [id number] 2. COPD with acute exacerbation, on chronic home oxygen 5 L/min 3. HTN  4.  Heavy smoking history 5.  Abdominal pain with proctitis noted on CT scan, resolved   Recommendations:  -She has T4aN2c supraglottic laryngeal cancer, with compromised airway, required urgent tracheostomy.  -Started concurrent CRT on 03/29/2020.  Tolerating chemotherapy well. Significant response in lymphadenopathy. -Sigmoidoscopy showed a large rectal ulcer benign, hyperplastic polyp. GI recommending laxatives and supportive care.  Abdominal pain now resolved. -  Blood cultures remain negative.  Sputum culture positive for Serratia marcescens -?colonization. -Labs obtained today in anticipation of chemotherapy on Monday, 04/19/2020.  She has mild anemia which is overall stable, magnesium remains normal, and renal function stable.  Labs are adequate to proceed with chemotherapy as planned on 04/19/2020. -The patient is scheduled as an outpatient on 04/19/2020 for chemotherapy.  I anticipate that she will be discharged in the next 24 to 48 hours.  If discharge is again delayed, we will plan for chemotherapy inpatient on Monday, 04/19/2020.  Mikey Bussing, DNP, AGPCNP-BC, AOCNP   ADDENDUM  .Patient was Personally and independently  interviewed, examined and relevant elements of the history of present illness were reviewed in details and an assessment and plan was created. All elements of the patient's history of present illness , assessment and plan were discussed in details with  Mikey Bussing, DNP, AGPCNP-BC, AOCNP. The above documentation reflects our combined findings assessment and plan.  Reasonable to proceed with next cycle of weekly Cisplatin with RT  . CBC Latest Ref Rng & Units 04/16/2020 04/14/2020 04/13/2020  WBC 4.0 - 10.5 K/uL 5.4 5.7 4.8  Hemoglobin 12.0 - 15.0 g/dL 10.5(L) 10.2(L) 10.4(L)  Hematocrit 36.0 - 46.0 % 32.3(L) 31.5(L) 32.3(L)  Platelets 150 - 400 K/uL 251 238 199    . CMP Latest Ref Rng & Units 04/16/2020 04/14/2020 04/13/2020  Glucose 70 - 99 mg/dL 92 90 92  BUN 6 - 20 mg/dL 17 15 17   Creatinine 0.44 - 1.00 mg/dL 1.09(H) 0.96 1.05(H)  Sodium 135 - 145 mmol/L 135 136 137  Potassium 3.5 - 5.1 mmol/L 4.7 4.1 4.0  Chloride 98 - 111 mmol/L 98 100 101  CO2 22 - 32 mmol/L 29 26 26   Calcium 8.9 - 10.3 mg/dL 8.3(L) 8.2(L) 8.2(L)  Total Protein 6.5 - 8.1 g/dL 6.1(L) - 5.3(L)  Total Bilirubin 0.3 - 1.2 mg/dL 0.4 - 0.5  Alkaline Phos 38 - 126 U/L 45 - 45  AST 15 - 41 U/L 13(L) - 13(L)  ALT 0 - 44 U/L 9 - 12     Sullivan Lone MD MS

## 2020-04-16 NOTE — TOC Transition Note (Addendum)
Transition of Care [facility]) - CM/SW Discharge Note   Patient Details  Name: Veronica Horton MRN: [id number] Date of Birth: 01-09-1961  [facility]) CM/SW Contact:  Dessa Phi, RN Phone Number: 04/16/2020, 11:48 AM   Clinical Narrative: [employer] following for all dme-they will manage delivery of supplies,& arrange with family.Patient has home 02 travel tank-family will bring to hospital.Nsg aware to provide 3days of trach supplies to [facility] prior d/c.No further CM needs.  Refer to prior Case Management notes also.    Final next level of care: Home/Self Care Barriers to Discharge: No Barriers Identified   Patient Goals and CMS Choice Patient states their goals for this hospitalization and ongoing recovery are:: to return home. CMS Medicare.gov Compare Post Acute Care list provided to:: Patient    Discharge Placement                       Discharge Plan and Services In-house Referral: Financial Counselor Discharge Planning Services: CM Consult Post Acute Care Choice: Durable Medical Equipment          DME Arranged: Oxygen DME Agency: [employer] Date DME Agency Contacted: 04/14/20 Time DME Agency Contacted: 1142 Representative spoke with at DME Agency: [place] ([place]) Interventions     Readmission Risk Interventions Readmission Risk Prevention Plan 01/20/2019  Medication Screening Complete  Transportation Screening Complete  Some recent data might be hidden

## 2020-04-16 NOTE — Discharge Summary (Signed)
Physician Discharge Summary  Veronica Horton NLZ:[id number] DOB: [birth year] DOA: 03/10/2020  PCP: [facility] date: 03/10/2020 Discharge date: 04/16/2020  Admitted From: Home  Discharge disposition: Home    Recommendations for Outpatient Follow-Up:   . Follow up with your primary care provider in one week.  . Check CBC, BMP, magnesium in the next visit . Patient had right upper extremity superficial thrombophlebitis.  Continue to monitor as outpatient. . Continue radiation and chemotherapy as outpatient.  Continue follow-up with oncology. . Continue tube feeding as outpatient. . Will need tracheostomy care at home plus follow-up for tracheostomy in the future.   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  Diet recommendation: .  Wound care: None.  Code status: Full.   History of Present Illness:   BarbaraWileyis a59 y.o.female,with medical history significant ofclass III obesity, hiatal hernia, hypertension, bronchitis, COPD, chronic respiratory failure on home oxygen at 5 LPM, active smoker of 1-1/2 packs of cigarettes per day, presented to the emergency department with complaint of shortness of breath and altered mental status with worsening dyspnea.  In the ED,chest x-ray was significant for no opacity or volume overload, her ABG was significant for pH of 7.2, PCO2 of 92 for which she was started on BiPAP. She was also started on IV steroids, and IV Lasix given for  significant lower extremity edema.  Hospitalization was complicated by finding of laryngeal mass as he underwent biopsy showed invasive squamous cell carcinoma and patient underwent tracheostomy. Has been at [facility] long since 11/17 for radiation therapy, also had PEG tube placed on 11/22.  Underwent flexible sigmoidoscopy on 12/13 due to rectal wall thickening concerning for proctitis seen on CT abdomen and was found to have a large rectal ulcer presumed to be stercoral ulcer in the setting of constipation. Her IV antibiotics were discontinued given less concern for infectious proctitis.   Hospital Course:   Following conditions were addressed during hospitalization as listed below,  Constipation.  Improved.. Status post flex sigmoidoscopy revealed large rectal ulcer presumed to be stercoral ulcer in setting of constipation/proctitis.    Continue MiraLAX to avoid constipation.  Loculated pleural effusion, left base.  CT chest formed on 04/13/2020 showed patchy airspace infiltrate with a left upper lobe and a small bilateral pleural effusion with atelectasis.  -Tracheal culture significant for Serratia, unclear clinical significance, previously on IV Cipro for presumed infectious proctitis .  No need for further antibiotic.  Fever in the setting of likely proctitis, resolved.  Off antibiotic at this time.  Right arm mild erythema and induration.  Ultrasound of the right upper extremity showed evidence of superficial vein thrombosis in the basilic and cephalic vein which was age-indeterminate.  No deep vein thrombosis.  Will continue supportive care with cold and warm compress.  Supraglottic cell laryngeal cancer, status post tracheostomy and PEG placement.Patient declined total laryngectomy.  Responding well to chemotherapy.  Receiving radiation treatment. Will need continued chemo on discharge.  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 COPD on 5L home O2 Trach collar currently.  Declined total laryngectomy.  CT scan chest with mild effusion but no metastatic disease.  Will consider-Brovana and Yuperlri at [facility] passy-muir valve. She will be seen in trach clinic after chemotherapy for her tumor to see if she can tolerate per PCCM.  Will need routine trach care at home.  Patient's sister is willing to help her on discharge.    Dyspnea secondary to COPD further exacerbated by laryngeal squamous cell carcinoma as well aspiration pneumonia.Chest x-ray on 12/12 shows bibasilar atelectasis and right middle lobe atelectasis.  CT scan on 04/13/2020 with mild effusions and infiltrate.  Continue bronchodilators, supplemental oxygen tracheostomy care.  Will need nebulizers on discharge. Will need continued trach care at home  AKI,Baseline creatinine 0.9-1.   Has resolved at this time.  Hypoglycemia episodes.  Improved.  On tube feeding.  GERD -continue PPI on discharge.  Disposition.  At this time, patient is stable for disposition home.  Will need to follow-up with oncology as outpatient.  Spoke with the patient's sister about disposition yesterday.  Medical Consultants:    Oncology  ENT  Radiation oncology  GI  Procedures:     Tracheostomy 03/13/2020  flexible sigmoidoscopy and polypectomy on 04/12/2020  PEG tube placement 11/22.  Subjective:   Today, patient was seen and examined at bedside.Patient denies any nausea vomiting shortness of breath cough fever.  Wishes to go home  Discharge Exam:   Vitals:   04/16/20 1255 04/16/20 1404  BP: (!) 91/44   Pulse: 67 70  Resp: 18 18  Temp: 98.9 F (37.2 C)   SpO2: (!) 89% 93%   Vitals:   04/16/20 0719 04/16/20 0739 04/16/20 1255 04/16/20 1404  BP:   (!) 91/44   Pulse:  (!) 59 67 70  Resp:  16 18 18   Temp:   98.9 F (37.2 C)   TempSrc:   Oral   SpO2: 94% 95% (!) 89% 93%  Weight:      Height:         GENERAL: Patient is alert awake and oriented. Not in obvious distress.  Obese, on trach  HENT: No scleral pallor or icterus. Pupils equally reactive to light. Oral mucosa is moist, obese on trach collar at 8 L/min. NECK: is supple, no gross swelling noted. CHEST: Coarse breath sounds noted bilaterally, diminished breath sounds bilaterally. CVS: S1 and S2 heard, no murmur. Regular rate and rhythm.  ABDOMEN: Soft, non-tender, bowel sounds are present.  PEG tube in place. EXTREMITIES: No edema. CNS: Cranial nerves are intact. No focal motor deficits.  Moving extremities. SKIN: warm and dry without rashes.   The results of significant diagnostics from this hospitalization (including imaging, microbiology, ancillary and laboratory) are listed below for reference.     Diagnostic Studies:   No results found.   Labs:   Basic Metabolic Panel: Recent Labs  Lab 04/11/20 0530 04/12/20 0515 04/13/20 0515 04/14/20 0610 04/16/20 0828  NA 135 132* 137 136 135  K 4.2 4.2 4.0 4.1 4.7  CL 99 97* 101 100 98  CO2 26 27 26 26 29   GLUCOSE 91 91 92 90 92  BUN 21* 21* 17 15 17   CREATININE 1.14* 1.17* 1.05* 0.96 1.09*  CALCIUM 8.3* 7.8* 8.2* 8.2* 8.3*  MG  --   --   --   --  2.2   GFR Estimated Creatinine Clearance: 69.8 mL/min (A) (by C-G formula  based on SCr of 1.09 mg/dL (H)). Liver Function Tests: Recent Labs  Lab 04/13/20 0515 04/16/20 0828  AST 13* 13*  ALT 12 9  ALKPHOS 45 45  BILITOT 0.5 0.4  PROT 5.3* 6.1*  ALBUMIN 2.5* 2.7*   No results for input(s): LIPASE, AMYLASE in the last 168 hours. No results for input(s): AMMONIA in the last 168 hours. Coagulation profile No results for input(s): INR, PROTIME in the last 168 hours.  CBC: Recent Labs  Lab 04/11/20 0530 04/12/20 0515 04/13/20 0515 04/14/20 0610 04/16/20 0828  WBC 5.6 5.9 4.8 5.7 5.4  NEUTROABS  --   --   --   --  4.0  HGB 10.7* 10.3* 10.4* 10.2* 10.5*  HCT 32.9* 30.7* 32.3* 31.5* 32.3*  MCV 97.3 95.9  97.3 97.8 97.3  PLT 166 189 199 238 251   Cardiac Enzymes: No results for input(s): CKTOTAL, CKMB, CKMBINDEX, TROPONINI in the last 168 hours. BNP: Invalid input(s): POCBNP CBG: Recent Labs  Lab 04/15/20 2023 04/15/20 2345 04/16/20 0428 04/16/20 0813 04/16/20 1125  GLUCAP 95 130* 100* 87 157*   D-Dimer No results for input(s): DDIMER in the last 72 hours. Hgb A1c No results for input(s): HGBA1C in the last 72 hours. Lipid Profile No results for input(s): CHOL, HDL, LDLCALC, TRIG, CHOLHDL, LDLDIRECT in the last 72 hours. Thyroid function studies No results for input(s): TSH, T4TOTAL, T3FREE, THYROIDAB in the last 72 hours.  Invalid input(s): FREET3 Anemia work up No results for input(s): VITAMINB12, FOLATE, FERRITIN, TIBC, IRON, RETICCTPCT in the last 72 hours. Microbiology Recent Results (from the past 240 hour(s))  Culture, blood (routine x 2)     Status: None   Collection Time: 04/09/20  8:23 AM   Specimen: BLOOD  Result Value Ref Range Status   Specimen Description   Final    BLOOD LEFT ANTECUBITAL Performed at [facility] [address] [place], [place] [address]    Special Requests   Final    BOTTLES DRAWN AEROBIC AND ANAEROBIC Blood Culture adequate volume Performed at [facility] [address] [place], [place] [address]    Culture   Final    NO GROWTH 5 DAYS Performed at [facility], [place] [address] [place], [place] [address]    Report Status 04/14/2020 FINAL  Final  Culture, blood (routine x 2)     Status: None   Collection Time: 04/09/20  8:30 AM   Specimen: BLOOD  Result Value Ref Range Status   Specimen Description   Final    BLOOD LEFT HAND Performed at [facility] [address], [place], [place] [address]    Special Requests   Final    BOTTLES DRAWN AEROBIC AND ANAEROBIC Blood Culture adequate volume Performed at [facility] [address]  [place], [place] [address]    Culture   Final    NO GROWTH 5 DAYS Performed at [facility], [place] [address], [place], [place] [address]    Report Status 04/14/2020 FINAL  Final  Culture, respiratory (non-expectorated)     Status: None   Collection Time: 04/09/20  3:26 PM   Specimen: Tracheal Aspirate; Respiratory  Result Value Ref Range Status   Specimen Description   Final    TRACHEAL ASPIRATE Performed at [facility] [address] [place], [place] [address]    Special Requests   Final    NONE Performed at [facility],  [place] [address], [place], [place] [address]    Gram Stain   Final    NO WBC SEEN RARE GRAM POSITIVE COCCI RARE GRAM NEGATIVE RODS Performed at [facility], [place] [address], [place], [place] [address]    Culture MODERATE SERRATIA MARCESCENS  Final   Report Status 04/12/2020 FINAL  Final   Organism ID, Bacteria SERRATIA MARCESCENS  Final      Susceptibility   Serratia marcescens - MIC*    CEFAZOLIN >=64 RESISTANT Resistant     CEFEPIME <=0.12 SENSITIVE Sensitive     CEFTAZIDIME <=1 SENSITIVE Sensitive     CEFTRIAXONE <=0.25 SENSITIVE Sensitive     CIPROFLOXACIN <=0.25 SENSITIVE Sensitive     GENTAMICIN <=1 SENSITIVE Sensitive     TRIMETH/SULFA <=20 SENSITIVE Sensitive     * MODERATE SERRATIA MARCESCENS     Discharge Instructions:   Discharge Instructions    CISPLATIN TREATMENT CONDITION   Complete by: As directed    Notify MD if Serum Creatinine > 1.5 or urine output < 200 mL prior to cisplatin.   CISPLATIN TREATMENT CONDITION   Complete by: As directed    Notify MD if Serum Creatinine > 1.5 or urine output < 200 mL prior to cisplatin.   CISPLATIN TREATMENT CONDITION   Complete by: As directed    Patient should have a magnesium level within 7 days prior to chemotherapy administration. Notify MD if Mg < 1.6.   Call MD for:  persistant nausea and vomiting   Complete by: As directed    Call MD for:   severe uncontrolled pain   Complete by: As directed    Call MD for:  temperature >100.4   Complete by: As directed    Diet - low sodium heart healthy   Complete by: As directed    Dysphagia 3 diet   Discharge instructions   Complete by: As directed    Follow-up with your primary care provider in 1 week. Continue trach care at home. Continue ice packs and alternate hot compression of the right arm.  Follow-up with oncology for chemotherapy and radiation treatment   Increase activity slowly   Complete by: As directed    No wound care   Complete by: As directed    TREATMENT CONDITIONS   Complete by: As directed    Patient should have CBC & BMP within 7 days prior to chemotherapy administration. NOTIFY MD IF: ANC < 1500, Hemoglobin < 8, PLT < 100,000,  Creatinine > 1.5 or if patient has unstable vital signs: Temperature > 38.5, SBP > 180 or < 90, RR > 30 or HR > 100.   TREATMENT CONDITIONS   Complete by: As directed    Patient should have CBC & BMP within 7 days prior to chemotherapy administration. NOTIFY MD IF: ANC < 1500, Hemoglobin < 8, PLT < 100,000,  Creatinine > 1.5 or if patient has unstable vital signs: Temperature > 38.5, SBP > 180 or < 90, RR > 30 or HR > 100.     Allergies as of 04/16/2020      Reactions   Codeine Hives   Reports itching only per RN   Penicillins Hives   Did it involve swelling of the face/tongue/throat, SOB, or low BP? No Did it involve sudden or severe rash/hives, skin peeling, or any reaction on the inside of your mouth or nose? Yes Did you need to seek medical attention at a hospital or doctor's office? Unknown When did it last happen?Over  10 years If all above answers are "NO", may proceed with cephalosporin use.      Medication List    STOP taking these medications   albuterol (2.5 MG/3ML) 0.083% nebulizer solution Commonly known as: PROVENTIL   albuterol 108 (90 Base) MCG/ACT inhaler Commonly known as: VENTOLIN HFA    budesonide-formoterol 160-4.5 MCG/ACT inhaler Commonly known as: Symbicort   cyclobenzaprine 10 MG tablet Commonly known as: FLEXERIL   furosemide 40 MG tablet Commonly known as: Lasix     TAKE these medications   arformoterol 15 MCG/2ML Nebu Commonly known as: BROVANA Take 2 mLs (15 mcg total) by nebulization 2 (two) times daily.   bisacodyl 10 MG suppository Commonly known as: DULCOLAX Place 1 suppository (10 mg total) rectally daily as needed for severe constipation.   budesonide 0.5 MG/2ML nebulizer solution Commonly known as: PULMICORT Take 2 mLs (0.5 mg total) by nebulization 2 (two) times daily.   diclofenac Sodium 1 % Gel Commonly known as: VOLTAREN Apply 2 g topically 4 (four) times daily as needed (neck/back pain).   diphenhydrAMINE 12.5 MG/5ML elixir Commonly known as: BENADRYL Place 5 mLs (12.5 mg total) into feeding tube every 6 (six) hours as needed for itching.   feeding supplement Liqd Take 237 mLs by mouth 2 (two) times daily between meals. What changed: Another medication with the same name was added. Make sure you understand how and when to take each.   feeding supplement (OSMOLITE 1.5 CAL) Liqd Place 237 mLs into feeding tube 3 (three) times daily between meals. What changed: You were already taking a medication with the same name, and this prescription was added. Make sure you understand how and when to take each.   lactose free nutrition Liqd Take 237 mLs by mouth daily. What changed: You were already taking a medication with the same name, and this prescription was added. Make sure you understand how and when to take each.   folic acid 1 MG tablet Commonly known as: FOLVITE Take 1 tablet (1 mg total) by mouth daily.   gabapentin 300 MG capsule Commonly known as: NEURONTIN Take 2 capsules (600 mg total) by mouth at bedtime. What changed: when to take this   guaiFENesin-dextromethorphan 100-10 MG/5ML syrup Commonly known as: ROBITUSSIN  DM Take 10 mLs by mouth every 4 (four) hours as needed for cough.   hydrOXYzine 10 MG tablet Commonly known as: ATARAX/VISTARIL Place 1 tablet (10 mg total) into feeding tube 3 (three) times daily as needed for anxiety.   ipratropium-albuterol 0.5-2.5 (3) MG/3ML Soln Commonly known as: DUONEB Take 3 mLs by nebulization every 6 (six) hours as needed.   ketotifen 0.025 % ophthalmic solution Commonly known as: ZADITOR Place 1 drop into both eyes 2 (two) times daily.   lidocaine 5 % Commonly known as: LIDODERM Place 1 patch onto the skin daily. Remove & Discard patch within 12 hours or as directed by MD   lisinopril 20 MG tablet Commonly known as: ZESTRIL Take 1 tablet (20 mg total) by mouth daily.   melatonin 5 MG Tabs Take 1 tablet (5 mg total) by mouth at bedtime.   nicotine 7 mg/24hr patch Commonly known as: NICODERM CQ - dosed in mg/24 hr Place 1 patch (7 mg total) onto the skin daily.   ondansetron 8 MG tablet Commonly known as: Zofran Take 1 tablet (8 mg total) by mouth 2 (two) times daily as needed. Start on the third day after chemotherapy.   oxyCODONE 5 MG immediate release tablet Commonly  known as: Oxy IR/ROXICODONE Place 1 tablet (5 mg total) into feeding tube every 6 (six) hours as needed for severe pain.   pantoprazole sodium 40 mg/20 mL Pack Commonly known as: PROTONIX Place 20 mLs (40 mg total) into feeding tube daily.   polyethylene glycol 17 g packet Commonly known as: MIRALAX / GLYCOLAX Place 17 g into feeding tube 2 (two) times daily as needed for moderate constipation.   prochlorperazine 10 MG tablet Commonly known as: COMPAZINE Take 1 tablet (10 mg total) by mouth every 6 (six) hours as needed (Nausea or vomiting).   saccharomyces boulardii 250 MG capsule Commonly known as: FLORASTOR Take 1 capsule (250 mg total) by mouth 2 (two) times daily.   scopolamine 1 MG/3DAYS Commonly known as: TRANSDERM-SCOP Place 1 patch (1.5 mg total) onto the skin  every 3 (three) days. Start taking on: April 18, 2020   vitamin B-12 500 MCG tablet Commonly known as: CYANOCOBALAMIN Take 1 tablet (500 mcg total) by mouth daily.            Durable Medical Equipment  (From admission, onward)         Start     Ordered   04/14/20 1239  For home use only DME oxygen  Once       Comments: Humidified air trach collar  Question Answer Comment  Length of Need 6 Months   Mode or (Route) Mask   Liters per Minute 8   Frequency Continuous (stationary and portable oxygen unit needed)   Oxygen conserving device Yes   Oxygen delivery system Other see comments      04/14/20 1239   04/08/20 1430  For home use only DME Other see comment  Once       Comments: RD to place orders for bolus feeds: -Osmolite 1.5 TID via PEG.  -Please flush with 60 ml of free water before and after each bolus. -This will provide 1065 kcals, 44 g protein and 903 ml H2O.  Question:  Length of Need  Answer:  6 Months   04/08/20 1434   03/31/20 0944  For home use only DME Trach supplies  Once       Question Answer Comment  Trach Type Shiley   Size 6 cuffless w/ reusable inner cannula   Cuffed or Uncuffed Uncuffed   Fenestrated No   Does patient need speaking valve? No   Trach Humidification Yes   If Oxygen Bleed In (lpm) 6   Suction Needed? Yes   Manual Resuscitator Needed? No   Emergency Backup Trach size (one size smaller than trach in throat) 4 cuffless shiley w/ reusable inner cannula      03/31/20 0944   03/29/20 1702  For home use only DME Other see comment  Once       Comments: Portable suction machine;french catheters-size 3fr;yankauers  Question:  Length of Need  Answer:  6 Months   03/29/20 1705   03/23/20 1124  For home use only DME Trach supplies  Once        03/23/20 1123          Follow-up Information    [facility] .   Specialty: Radiology Contact information: [address] [phone] [name] [facility]. Go on 05/05/2020.   Specialty: Respiratory Therapy Why: @ 2p;Entrance A-valet parking.Call with any concerns. Contact information: [place] [phone] [place]  [place]. Go on 05/03/2020.   Why: 2p. Contact information: [address]               Time coordinating discharge: 39 minutes  Signed:  Jader Desai  [facility] 04/16/2020, 3:58 PM

## 2020-04-16 NOTE — Progress Notes (Addendum)
Nutrition Follow-up  DOCUMENTATION CODES:   Obesity unspecified  INTERVENTION:   Discontinue Boost Plus once daily as pt not drinking.   Begin 237 ml Osmolite 1.5 QID via PEG Flush with 60 ml before and after each bolus (480 ml total)  This regimen provides a total of 1422 kcals, 60 grams of protein, and 1204 mL of free water.   Continue to monitor po intake   NUTRITION DIAGNOSIS:   Increased nutrient needs related to cancer and cancer related treatments,chronic illness as evidenced by estimated needs.  Progressing  GOAL:   Patient will meet greater than or equal to 90% of their needs  Progressing  MONITOR:   TF tolerance,Diet advancement,Labs,Weight trends  REASON FOR ASSESSMENT:   Consult Assessment of nutrition requirement/status  ASSESSMENT:   59 year old female with history significant of hiatal hernia, HTN, bronchitis, COPD, and chronic respiratory failure on 5 L oxygen at night, presents with AMS and worsening dyspnea over the last 4 days.  11/11 MBS by SLP with abnormal larynx, CT neck with supraglottic mass 11/13 Direct laryngoscopy with biopsy and trach 11/14 intubated 11/15 CT chest/abdomen pelvis with no signs of metastatic disease, partially necrotic lymph node to left neck, Cortrak placed, transitioned to trach collar 11/17 transferred to [facility] for chemoradiation 11/22: PEG placement 11/27: MBS: SLP recommending D3 diet with thin liquids 12/1: diet liberalized to regular  12/3: Bolus feeds resumed 12/13: underwent flex sigmoidoscopy, revealed large rectal ulcer 12/20: Next chemotherapy treatment  Per pt report, she is able to eat PO without chewing or swallowing complications. Pt reports she is able to consume her foods better since downgrading to Dysphagia 3 diet. She reports that some foods are more difficult than others to eat but overall reports an easier time with softer textures. Pt reports that she feels full quickly and often cannot finish her  meals. Per chart review, pt consumed 20-25% of last two documented meal records on 12/16.   Noted lunch tray at bedside of oatmeal, cereal, juice and applesauce with 50% consumed. RN reports pt ordered same meal for breakfast and lunch today. RN reports pt has continued coughing for a few weeks but attributed this to increased secretions and mucus. RN reports no recent diarrhea. Pt denies abdominal discomfort.   Pt reports she is not drinking Boost protein drinks because they are too thick and she does not like the taste.   Pt reports no issues with her TF via PEG tube. She is currently on 237 mL Osmolite 1.5 TID with 60 mL free water flushes before and after each bolus. This regimen provides 1065 kcals, 44 grams protein and 903 mL free water. This regimen meets 51% of her estimated kcal needs, and 52% of her estimated protein needs.     Per chart review, pt's weight fluctuates frequently during this admission.   Labs reviewed.   Medications reviewed and include: Dulcolax, Neurotonin, liquid MVI 15 mL, Protonix, SSI   Diet Order:   Diet Order            DIET DYS 3 Room service appropriate? Yes with Assist; Fluid consistency: Thin  Diet effective now                 EDUCATION NEEDS:   Not appropriate for education at this time  Skin:  Skin Assessment: Skin Integrity Issues: Skin Integrity Issues:: Other (Comment) Other: Cellulits; LLE; MASD; R thigh  Last BM:  12/16  Height:   Ht Readings from Last 1 Encounters:  03/14/20 5\' 5"  (1.651 m)    Weight:   Wt Readings from Last 1 Encounters:  04/15/20 116.1 kg    BMI:  Body mass index is 42.59 kg/m.  Estimated Nutritional Needs:   Kcal:  2100-2300  Protein:  85-100g  Fluid:  2L/day    Ronnald Nian, Dietetic Intern Pager: [phone] If unavailable: [phone]

## 2020-04-16 NOTE — TOC Transition Note (Addendum)
Transition of Care [facility]) - CM/SW Discharge Note   Patient Details  Name: Veronica Horton MRN: [id number] Date of Birth: Apr 04, 1961  Transition of Care [facility]) CM/SW Contact:  Dessa Phi, RN Phone Number: 04/16/2020, 2:03 PM   Clinical Narrative:  Provided [employer] rep Thedore Mins- with delivery address for DME: Kim(sister) contact person [address] [employer] [address] tel#[phone].[name](sister) in rm receiving teaching, will tranport home,has 02 travel tank.No further CM needs. 3p-Nurse to document 02 sats. Received call about dme cost informed nurse that [place] works directly with the patient's family on the cost, & affordability. No further CM needs.    Final next level of care: Home/Self Care Barriers to Discharge: No Barriers Identified   Patient Goals and CMS Choice Patient states their goals for this hospitalization and ongoing recovery are:: to return home. CMS Medicare.gov Compare Post Acute Care list provided to:: Patient    Discharge Placement                       Discharge Plan and Services In-house Referral: Financial Counselor Discharge Planning Services: CM Consult Post Acute Care Choice: Durable Medical Equipment          DME Arranged: Oxygen DME Agency: [employer] Date DME Agency Contacted: 04/14/20 Time DME Agency Contacted: 1142 Representative spoke with at DME Agency: [facility] Interventions     Readmission Risk Interventions Readmission Risk Prevention Plan 01/20/2019  Medication Screening Complete  Transportation Screening Complete  Some recent data might be hidden

## 2020-04-19 ENCOUNTER — Other Ambulatory Visit: Payer: Self-pay

## 2020-04-19 ENCOUNTER — Ambulatory Visit: Payer: Medicaid Other | Admitting: Medical

## 2020-04-19 ENCOUNTER — Other Ambulatory Visit: Payer: Medicaid Other

## 2020-04-19 ENCOUNTER — Ambulatory Visit
Admission: RE | Admit: 2020-04-19 | Discharge: 2020-04-19 | Disposition: A | Discharge status: Home / Self Care | Payer: Medicaid Other | Source: Ambulatory Visit | Attending: Radiation Oncology | Admitting: Radiation Oncology

## 2020-04-19 ENCOUNTER — Inpatient Hospital Stay: Payer: Medicaid Other

## 2020-04-19 ENCOUNTER — Other Ambulatory Visit: Payer: Self-pay | Admitting: Hematology

## 2020-04-19 VITALS — BP 107/55 | HR 55 | Temp 99.1°F | Resp 20 | Wt 255.5 lb

## 2020-04-19 DIAGNOSIS — Z23 Encounter for immunization: Secondary | ICD-10-CM

## 2020-04-19 DIAGNOSIS — C321 Malignant neoplasm of supraglottis: Secondary | ICD-10-CM | POA: Diagnosis present

## 2020-04-19 DIAGNOSIS — Z923 Personal history of irradiation: Secondary | ICD-10-CM | POA: Diagnosis not present

## 2020-04-19 DIAGNOSIS — C328 Malignant neoplasm of overlapping sites of larynx: Secondary | ICD-10-CM | POA: Diagnosis not present

## 2020-04-19 DIAGNOSIS — Z510 Encounter for antineoplastic radiation therapy: Secondary | ICD-10-CM | POA: Diagnosis present

## 2020-04-19 DIAGNOSIS — Z5112 Encounter for antineoplastic immunotherapy: Secondary | ICD-10-CM | POA: Diagnosis present

## 2020-04-19 MED ORDER — SODIUM CHLORIDE 0.9 % IV SOLN
150.0000 mg | Freq: Once | INTRAVENOUS | Status: AC
  Administered 2020-04-19: 13:00:00 150 mg via INTRAVENOUS
  Filled 2020-04-19: qty 150

## 2020-04-19 MED ORDER — INFLUENZA VAC SPLIT QUAD 0.5 ML IM SUSY
0.5000 mL | PREFILLED_SYRINGE | INTRAMUSCULAR | Status: AC
  Administered 2020-04-19: 12:00:00 0.5 mL via INTRAMUSCULAR

## 2020-04-19 MED ORDER — INFLUENZA VAC SPLIT QUAD 0.5 ML IM SUSY
PREFILLED_SYRINGE | INTRAMUSCULAR | Status: AC
  Filled 2020-04-19: qty 0.5

## 2020-04-19 MED ORDER — PALONOSETRON HCL INJECTION 0.25 MG/5ML
0.2500 mg | Freq: Once | INTRAVENOUS | Status: AC
  Administered 2020-04-19: 13:00:00 0.25 mg via INTRAVENOUS

## 2020-04-19 MED ORDER — SODIUM CHLORIDE 0.9 % IV SOLN
Freq: Once | INTRAVENOUS | Status: AC
  Filled 2020-04-19: qty 10

## 2020-04-19 MED ORDER — SODIUM CHLORIDE 0.9 % IV SOLN
40.0000 mg/m2 | Freq: Once | INTRAVENOUS | Status: AC
  Administered 2020-04-19: 15:00:00 91 mg via INTRAVENOUS
  Filled 2020-04-19: qty 91

## 2020-04-19 MED ORDER — SODIUM CHLORIDE 0.9 % IV SOLN
10.0000 mg | Freq: Once | INTRAVENOUS | Status: AC
  Administered 2020-04-19: 13:00:00 10 mg via INTRAVENOUS
  Filled 2020-04-19: qty 10

## 2020-04-19 MED ORDER — PALONOSETRON HCL INJECTION 0.25 MG/5ML
INTRAVENOUS | Status: AC
  Filled 2020-04-19: qty 5

## 2020-04-19 MED ORDER — SODIUM CHLORIDE 0.9 % IV SOLN
Freq: Once | INTRAVENOUS | Status: AC
  Filled 2020-04-19: qty 250

## 2020-04-19 NOTE — Patient Instructions (Addendum)
[facility] Discharge Instructions for Patients Receiving Chemotherapy  Today you received the following chemotherapy agent: Cisplatin  To help prevent nausea and vomiting after your treatment, we encourage you to take your nausea medication as directed by your MD.   If you develop nausea and vomiting that is not controlled by your nausea medication, call the clinic.   BELOW ARE SYMPTOMS THAT SHOULD BE REPORTED IMMEDIATELY:  *FEVER GREATER THAN 100.5 F  *CHILLS WITH OR WITHOUT FEVER  NAUSEA AND VOMITING THAT IS NOT CONTROLLED WITH YOUR NAUSEA MEDICATION  *UNUSUAL SHORTNESS OF BREATH  *UNUSUAL BRUISING OR BLEEDING  TENDERNESS IN MOUTH AND THROAT WITH OR WITHOUT PRESENCE OF ULCERS  *URINARY PROBLEMS  *BOWEL PROBLEMS  UNUSUAL RASH Items with * indicate a potential emergency and should be followed up as soon as possible.  Feel free to call the clinic should you have any questions or concerns. The clinic phone number is [phone].  Please show the [facility] at check-in to the Emergency Department and triage nurse.   Influenza, Adult Influenza, more commonly known as "the flu," is a viral infection that mainly affects the respiratory tract. The respiratory tract includes organs that help you breathe, such as the lungs, nose, and throat. The flu causes many symptoms similar to the common cold along with high fever and body aches. The flu spreads easily from person to person (is contagious). Getting a flu shot (influenza vaccination) every year is the best way to prevent the flu. What are the causes? This condition is caused by the influenza virus. You can get the virus by:  Breathing in droplets that are in the air from an infected person's cough or sneeze.  Touching something that has been exposed to the virus (has been contaminated) and then touching your mouth, nose, or eyes. What increases the risk? The following factors may make you more likely to  get the flu:  Not washing or sanitizing your hands often.  Having close contact with many people during cold and flu season.  Touching your mouth, eyes, or nose without first washing or sanitizing your hands.  Not getting a yearly (annual) flu shot. You may have a higher risk for the flu, including serious problems such as a lung infection (pneumonia), if you:  Are older than 65.  Are pregnant.  Have a weakened disease-fighting system (immune system). You may have a weakened immune system if you: ? Have HIV or AIDS. ? Are undergoing chemotherapy. ? Are taking medicines that reduce (suppress) the activity of your immune system.  Have a long-term (chronic) illness, such as heart disease, kidney disease, diabetes, or lung disease.  Have a liver disorder.  Are severely overweight (morbidly obese).  Have anemia. This is a condition that affects your red blood cells.  Have asthma. What are the signs or symptoms? Symptoms of this condition usually begin suddenly and last 4-14 days. They may include:  Fever and chills.  Headaches, body aches, or muscle aches.  Sore throat.  Cough.  Runny or stuffy (congested) nose.  Chest discomfort.  Poor appetite.  Weakness or fatigue.  Dizziness.  Nausea or vomiting. How is this diagnosed? This condition may be diagnosed based on:  Your symptoms and medical history.  A physical exam.  Swabbing your nose or throat and testing the fluid for the influenza virus. How is this treated? If the flu is diagnosed early, you can be treated with medicine that can help reduce how severe the illness  is and how long it lasts (antiviral medicine). This may be given by mouth (orally) or through an IV. Taking care of yourself at home can help relieve symptoms. Your health care provider may recommend:  Taking over-the-counter medicines.  Drinking plenty of fluids. In many cases, the flu goes away on its own. If you have severe symptoms or  complications, you may be treated in a hospital. Follow these instructions at home: Activity  Rest as needed and get plenty of sleep.  Stay home from work or school as told by your health care provider. Unless you are visiting your health care provider, avoid leaving home until your fever has been gone for 24 hours without taking medicine. Eating and drinking  Take an oral rehydration solution (ORS). This is a drink that is sold at pharmacies and retail stores.  Drink enough fluid to keep your urine pale yellow.  Drink clear fluids in small amounts as you are able. Clear fluids include water, ice chips, diluted fruit juice, and low-calorie sports drinks.  Eat bland, easy-to-digest foods in small amounts as you are able. These foods include bananas, applesauce, rice, lean meats, toast, and crackers.  Avoid drinking fluids that contain a lot of sugar or caffeine, such as energy drinks, regular sports drinks, and soda.  Avoid alcohol.  Avoid spicy or fatty foods. General instructions      Take over-the-counter and prescription medicines only as told by your health care provider.  Use a cool mist humidifier to add humidity to the air in your home. This can make it easier to breathe.  Cover your mouth and nose when you cough or sneeze.  Wash your hands with soap and water often, especially after you cough or sneeze. If soap and water are not available, use alcohol-based hand sanitizer.  Keep all follow-up visits as told by your health care provider. This is important. How is this prevented?   Get an annual flu shot. You may get the flu shot in late summer, fall, or winter. Ask your health care provider when you should get your flu shot.  Avoid contact with people who are sick during cold and flu season. This is generally fall and winter. Contact a health care provider if:  You develop new symptoms.  You have: ? Chest pain. ? Diarrhea. ? A fever.  Your cough gets  worse.  You produce more mucus.  You feel nauseous or you vomit. Get help right away if:  You develop shortness of breath or difficulty breathing.  Your skin or nails turn a bluish color.  You have severe pain or stiffness in your neck.  You develop a sudden headache or sudden pain in your face or ear.  You cannot eat or drink without vomiting. Summary  Influenza, more commonly known as "the flu," is a viral infection that primarily affects your respiratory tract.  Symptoms of the flu usually begin suddenly and last 4-14 days.  Getting an annual flu shot is the best way to prevent getting the flu.  Stay home from work or school as told by your health care provider. Unless you are visiting your health care provider, avoid leaving home until your fever has been gone for 24 hours without taking medicine.  Keep all follow-up visits as told by your health care provider. This is important. This information is not intended to replace advice given to you by your health care provider. Make sure you discuss any questions you have with your health care provider.  Document Revised: 07/18/2018 Document Reviewed: 10/03/2017 Elsevier Patient Education  [place].

## 2020-04-19 NOTE — Progress Notes (Signed)
Per Dr. Irene Limbo OK to run post hydration cisplatin fluids at 500 cc/hr       Covid-19 Vaccination Clinic  Name:  Veronica Horton    MRN: [id number] DOB: 1960/11/20  04/19/2020  Veronica Horton was observed post Covid-19 immunization for 15 minutes without incident. She was provided with Vaccine Information Sheet and instruction to access the V-Safe system.   Veronica Horton was instructed to call 911 with any severe reactions post vaccine: Marland Kitchen Difficulty breathing  . Swelling of face and throat  . A fast heartbeat  . A bad rash all over body  . Dizziness and weakness

## 2020-04-19 NOTE — Progress Notes (Signed)
flu

## 2020-04-20 ENCOUNTER — Ambulatory Visit
Admission: RE | Admit: 2020-04-20 | Discharge: 2020-04-20 | Disposition: A | Discharge status: Home / Self Care | Payer: Medicaid Other | Source: Ambulatory Visit | Attending: Radiation Oncology | Admitting: Radiation Oncology

## 2020-04-20 ENCOUNTER — Encounter: Payer: Self-pay | Admitting: Hematology and Oncology

## 2020-04-20 DIAGNOSIS — Z510 Encounter for antineoplastic radiation therapy: Secondary | ICD-10-CM | POA: Diagnosis not present

## 2020-04-20 NOTE — Progress Notes (Signed)
Called pt to introduce myself as her Arboriculturist and to discuss the [employer].  I couldn't leave a msg because her voicemail isn't set up.  I will try again on 04/21/20.

## 2020-04-21 ENCOUNTER — Inpatient Hospital Stay: Payer: Medicaid Other

## 2020-04-21 ENCOUNTER — Ambulatory Visit
Admission: RE | Admit: 2020-04-21 | Discharge: 2020-04-21 | Disposition: A | Discharge status: Home / Self Care | Payer: Medicaid Other | Source: Ambulatory Visit | Attending: Radiation Oncology | Admitting: Radiation Oncology

## 2020-04-21 ENCOUNTER — Encounter: Payer: Medicaid Other | Admitting: Nutrition

## 2020-04-21 DIAGNOSIS — Z510 Encounter for antineoplastic radiation therapy: Secondary | ICD-10-CM | POA: Diagnosis not present

## 2020-04-21 NOTE — Progress Notes (Signed)
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  Hospital admission reviewed.  Past medical history of COPD with home oxygen, HTN, smoker, s/p trach and PEG (11/22) during admission.  Patient receiving radiation and chemotherapy.    Met patient and sister, [name].  Patient reports that she was delivered tube feeding formula late last night (12/21) and gave 1 tube feeding last night.  Patient was discharged on 12/17.  Patient says she has not received any other tube feeding supplies (syringes, tape, gauze).  Patient has been eating soft foods (green beans, mashed potatoes, cheesecake, ice cream. Meats are hard for her to swallow.  Noted SLP recommended dysphagia 3 with thin liquids. Patient drinking water during visit.   Reports that was taking 3 osmolite 1.5 in hospital.  Noted inpatient RD had recommended increasing to 4 cartons per day as not drinking boost shake.   Patient ran out of oxygen at the beginning of meeting, brought home oxygen tank.  RD spoke with nursing and was brought cancer center tank for patient to use during visit.  Sister reports that they have oxygen tanks at home and all are empty.  Has not been able to reach [facility] for more oxygen tanks.     Medications: reviewed   Labs: reviewed   Anthropometrics:   Weight 255 lb 8 oz on 12/20  9/25 246 lb noted   Estimated Energy Needs  Kcals: 2500-2800 Protein: 125-140 g Fluid: > 2.5 L   NUTRITION DIAGNOSIS: Inadequate oral intake related to cancer as evidenced by PEG tube and limited oral intake   INTERVENTION: Recommend osmolite 1.5 1 carton QID. Flush with 73ml of water before and after feeding.  Written instructions given to patient and sister.  Patient to eat orally soft, finely chopped foods for additional calories and protein and continue oral hydration.  RD called and spoke with [facility] regarding oxygen  issues and enteral supplies.  [employer] delivered 4 tanks to patient at the cancer center after RD appointment.  Plans were for [facility] representative to come back out to house today regarding oxygen.   RD gave patient more enteral supplies today (syringes, gauze, tape, etc).  [employer] to follow-up.   RD returned cancer center tank to Radiation department after [employer] delivered tanks to patient at cancer center.      MONITORING, EVALUATION, GOAL: weight trends, intake   Next Visit: Dec 29   Curtistine Pettitt B. Zenia Resides, [place], [place] Registered Dietitian [phone] (mobile)

## 2020-04-22 ENCOUNTER — Ambulatory Visit
Admission: RE | Admit: 2020-04-22 | Discharge: 2020-04-22 | Disposition: A | Discharge status: Home / Self Care | Payer: Medicaid Other | Source: Ambulatory Visit | Attending: Radiation Oncology | Admitting: Radiation Oncology

## 2020-04-22 ENCOUNTER — Other Ambulatory Visit: Payer: Self-pay

## 2020-04-22 DIAGNOSIS — Z510 Encounter for antineoplastic radiation therapy: Secondary | ICD-10-CM | POA: Diagnosis not present

## 2020-04-24 ENCOUNTER — Encounter (HOSPITAL_COMMUNITY): Payer: Self-pay

## 2020-04-24 ENCOUNTER — Emergency Department (HOSPITAL_COMMUNITY): Payer: Medicaid Other

## 2020-04-24 ENCOUNTER — Other Ambulatory Visit: Payer: Self-pay

## 2020-04-24 ENCOUNTER — Inpatient Hospital Stay (HOSPITAL_COMMUNITY)
Admission: EM | Admit: 2020-04-24 | Discharge: 2020-04-30 | DRG: 871 | Disposition: A | Discharge status: Home / Self Care | Payer: Medicaid Other | Attending: Internal Medicine | Admitting: Internal Medicine

## 2020-04-24 DIAGNOSIS — I872 Venous insufficiency (chronic) (peripheral): Secondary | ICD-10-CM | POA: Diagnosis present

## 2020-04-24 DIAGNOSIS — R0602 Shortness of breath: Secondary | ICD-10-CM

## 2020-04-24 DIAGNOSIS — A419 Sepsis, unspecified organism: Secondary | ICD-10-CM | POA: Diagnosis not present

## 2020-04-24 DIAGNOSIS — Z79899 Other long term (current) drug therapy: Secondary | ICD-10-CM

## 2020-04-24 DIAGNOSIS — N179 Acute kidney failure, unspecified: Secondary | ICD-10-CM | POA: Diagnosis not present

## 2020-04-24 DIAGNOSIS — J440 Chronic obstructive pulmonary disease with acute lower respiratory infection: Secondary | ICD-10-CM | POA: Diagnosis present

## 2020-04-24 DIAGNOSIS — J9621 Acute and chronic respiratory failure with hypoxia: Secondary | ICD-10-CM | POA: Diagnosis present

## 2020-04-24 DIAGNOSIS — G8929 Other chronic pain: Secondary | ICD-10-CM | POA: Diagnosis not present

## 2020-04-24 DIAGNOSIS — L89892 Pressure ulcer of other site, stage 2: Secondary | ICD-10-CM | POA: Diagnosis present

## 2020-04-24 DIAGNOSIS — Z8249 Family history of ischemic heart disease and other diseases of the circulatory system: Secondary | ICD-10-CM | POA: Diagnosis not present

## 2020-04-24 DIAGNOSIS — J90 Pleural effusion, not elsewhere classified: Secondary | ICD-10-CM | POA: Diagnosis not present

## 2020-04-24 DIAGNOSIS — Z9049 Acquired absence of other specified parts of digestive tract: Secondary | ICD-10-CM

## 2020-04-24 DIAGNOSIS — I10 Essential (primary) hypertension: Secondary | ICD-10-CM | POA: Diagnosis present

## 2020-04-24 DIAGNOSIS — Z880 Allergy status to penicillin: Secondary | ICD-10-CM

## 2020-04-24 DIAGNOSIS — Z20822 Contact with and (suspected) exposure to covid-19: Secondary | ICD-10-CM | POA: Diagnosis present

## 2020-04-24 DIAGNOSIS — Z6841 Body Mass Index (BMI) 40.0 and over, adult: Secondary | ICD-10-CM | POA: Diagnosis not present

## 2020-04-24 DIAGNOSIS — F17210 Nicotine dependence, cigarettes, uncomplicated: Secondary | ICD-10-CM | POA: Diagnosis not present

## 2020-04-24 DIAGNOSIS — J189 Pneumonia, unspecified organism: Secondary | ICD-10-CM | POA: Diagnosis not present

## 2020-04-24 DIAGNOSIS — Z931 Gastrostomy status: Secondary | ICD-10-CM

## 2020-04-24 DIAGNOSIS — Z930 Tracheostomy status: Secondary | ICD-10-CM

## 2020-04-24 DIAGNOSIS — J690 Pneumonitis due to inhalation of food and vomit: Secondary | ICD-10-CM | POA: Diagnosis not present

## 2020-04-24 DIAGNOSIS — Z885 Allergy status to narcotic agent status: Secondary | ICD-10-CM

## 2020-04-24 DIAGNOSIS — C329 Malignant neoplasm of larynx, unspecified: Secondary | ICD-10-CM | POA: Diagnosis not present

## 2020-04-24 DIAGNOSIS — R1310 Dysphagia, unspecified: Secondary | ICD-10-CM | POA: Diagnosis present

## 2020-04-24 DIAGNOSIS — M5136 Other intervertebral disc degeneration, lumbar region: Secondary | ICD-10-CM | POA: Diagnosis not present

## 2020-04-24 DIAGNOSIS — G936 Cerebral edema: Secondary | ICD-10-CM | POA: Diagnosis not present

## 2020-04-24 DIAGNOSIS — F32A Depression, unspecified: Secondary | ICD-10-CM | POA: Diagnosis present

## 2020-04-24 DIAGNOSIS — I82431 Acute embolism and thrombosis of right popliteal vein: Secondary | ICD-10-CM | POA: Diagnosis present

## 2020-04-24 DIAGNOSIS — Z7951 Long term (current) use of inhaled steroids: Secondary | ICD-10-CM

## 2020-04-24 LAB — CBC WITH DIFFERENTIAL/PLATELET
Abs Immature Granulocytes: 0.04 10*3/uL (ref 0.00–0.07)
Basophils Absolute: 0 10*3/uL (ref 0.0–0.1)
Basophils Relative: 0 %
Eosinophils Absolute: 0 10*3/uL (ref 0.0–0.5)
Eosinophils Relative: 0 %
HCT: 32.9 % — ABNORMAL LOW (ref 36.0–46.0)
Hemoglobin: 10.2 g/dL — ABNORMAL LOW (ref 12.0–15.0)
Immature Granulocytes: 1 %
Lymphocytes Relative: 6 %
Lymphs Abs: 0.5 10*3/uL — ABNORMAL LOW (ref 0.7–4.0)
MCH: 30.9 pg (ref 26.0–34.0)
MCHC: 31 g/dL (ref 30.0–36.0)
MCV: 99.7 fL (ref 80.0–100.0)
Monocytes Absolute: 0.8 10*3/uL (ref 0.1–1.0)
Monocytes Relative: 10 %
Neutro Abs: 7.1 10*3/uL (ref 1.7–7.7)
Neutrophils Relative %: 83 %
Platelets: 187 10*3/uL (ref 150–400)
RBC: 3.3 MIL/uL — ABNORMAL LOW (ref 3.87–5.11)
RDW: 15.7 % — ABNORMAL HIGH (ref 11.5–15.5)
WBC: 8.5 10*3/uL (ref 4.0–10.5)
nRBC: 0 % (ref 0.0–0.2)

## 2020-04-24 LAB — COMPREHENSIVE METABOLIC PANEL WITH GFR
ALT: 11 U/L (ref 0–44)
AST: 13 U/L — ABNORMAL LOW (ref 15–41)
Albumin: 3 g/dL — ABNORMAL LOW (ref 3.5–5.0)
Alkaline Phosphatase: 42 U/L (ref 38–126)
Anion gap: 7 (ref 5–15)
BUN: 25 mg/dL — ABNORMAL HIGH (ref 6–20)
CO2: 29 mmol/L (ref 22–32)
Calcium: 8.4 mg/dL — ABNORMAL LOW (ref 8.9–10.3)
Chloride: 98 mmol/L (ref 98–111)
Creatinine, Ser: 1.79 mg/dL — ABNORMAL HIGH (ref 0.44–1.00)
GFR, Estimated: 32 mL/min — ABNORMAL LOW
Glucose, Bld: 108 mg/dL — ABNORMAL HIGH (ref 70–99)
Potassium: 5 mmol/L (ref 3.5–5.1)
Sodium: 134 mmol/L — ABNORMAL LOW (ref 135–145)
Total Bilirubin: 0.9 mg/dL (ref 0.3–1.2)
Total Protein: 6.3 g/dL — ABNORMAL LOW (ref 6.5–8.1)

## 2020-04-24 LAB — LACTIC ACID, PLASMA
Lactic Acid, Venous: 0.8 mmol/L (ref 0.5–1.9)
Lactic Acid, Venous: 0.8 mmol/L (ref 0.5–1.9)

## 2020-04-24 LAB — PROTIME-INR
INR: 1.2 (ref 0.8–1.2)
Prothrombin Time: 14.3 seconds (ref 11.4–15.2)

## 2020-04-24 LAB — RESP PANEL BY RT-PCR (FLU A&B, COVID) ARPGX2
Influenza A by PCR: NEGATIVE
Influenza B by PCR: NEGATIVE
SARS Coronavirus 2 by RT PCR: NEGATIVE

## 2020-04-24 LAB — TSH: TSH: 2.255 u[IU]/mL (ref 0.350–4.500)

## 2020-04-24 LAB — APTT: aPTT: 29 seconds (ref 24–36)

## 2020-04-24 MED ORDER — BUDESONIDE 0.5 MG/2ML IN SUSP
0.5000 mg | Freq: Two times a day (BID) | RESPIRATORY_TRACT | Status: DC
  Administered 2020-04-24 – 2020-04-30 (×12): 0.5 mg via RESPIRATORY_TRACT
  Filled 2020-04-24 (×12): qty 2

## 2020-04-24 MED ORDER — POLYETHYLENE GLYCOL 3350 17 G PO PACK: 17.0000 g | PACK | Freq: Two times a day (BID) | ORAL | Status: DC | PRN

## 2020-04-24 MED ORDER — PROCHLORPERAZINE MALEATE 10 MG PO TABS: 10.0000 mg | ORAL_TABLET | Freq: Four times a day (QID) | ORAL | Status: DC | PRN

## 2020-04-24 MED ORDER — ARFORMOTEROL TARTRATE 15 MCG/2ML IN NEBU
15.0000 ug | INHALATION_SOLUTION | Freq: Two times a day (BID) | RESPIRATORY_TRACT | Status: DC
  Administered 2020-04-24 – 2020-04-30 (×12): 15 ug via RESPIRATORY_TRACT
  Filled 2020-04-24 (×12): qty 2

## 2020-04-24 MED ORDER — CIPROFLOXACIN IN D5W 400 MG/200ML IV SOLN
400.0000 mg | Freq: Once | INTRAVENOUS | Status: AC
  Administered 2020-04-24: 15:00:00 400 mg via INTRAVENOUS
  Filled 2020-04-24: qty 200

## 2020-04-24 MED ORDER — GABAPENTIN 300 MG PO CAPS
600.0000 mg | ORAL_CAPSULE | Freq: Every day | ORAL | Status: DC
  Administered 2020-04-24: 600 mg via ORAL
  Filled 2020-04-24: qty 2

## 2020-04-24 MED ORDER — ENSURE ENLIVE PO LIQD
237.0000 mL | Freq: Two times a day (BID) | ORAL | Status: DC
  Filled 2020-04-24 (×4): qty 237

## 2020-04-24 MED ORDER — ENOXAPARIN SODIUM 40 MG/0.4ML ~~LOC~~ SOLN: 40.0000 mg | SUBCUTANEOUS | Status: DC

## 2020-04-24 MED ORDER — DICLOFENAC SODIUM 1 % EX GEL
2.0000 g | Freq: Four times a day (QID) | CUTANEOUS | Status: DC | PRN
  Filled 2020-04-24: qty 100

## 2020-04-24 MED ORDER — SODIUM CHLORIDE 0.9 % IV SOLN
2.0000 g | INTRAVENOUS | Status: DC
  Administered 2020-04-24 – 2020-04-29 (×6): 2 g via INTRAVENOUS
  Filled 2020-04-24 (×4): qty 2
  Filled 2020-04-24 (×2): qty 20
  Filled 2020-04-24: qty 2

## 2020-04-24 MED ORDER — GUAIFENESIN-DM 100-10 MG/5ML PO SYRP
10.0000 mL | ORAL_SOLUTION | ORAL | Status: DC | PRN
  Administered 2020-04-30 (×2): 10 mL via ORAL

## 2020-04-24 MED ORDER — ACETAMINOPHEN 325 MG PO TABS
650.0000 mg | ORAL_TABLET | Freq: Four times a day (QID) | ORAL | Status: DC | PRN
  Filled 2020-04-24: qty 2

## 2020-04-24 MED ORDER — IOHEXOL 300 MG/ML  SOLN
100.0000 mL | Freq: Once | INTRAMUSCULAR | Status: AC | PRN
  Administered 2020-04-24: 17:00:00 75 mL via INTRAVENOUS

## 2020-04-24 MED ORDER — METRONIDAZOLE 500 MG PO TABS
500.0000 mg | ORAL_TABLET | Freq: Three times a day (TID) | ORAL | Status: DC
  Administered 2020-04-24 – 2020-04-30 (×18): 500 mg
  Filled 2020-04-24 (×18): qty 1

## 2020-04-24 MED ORDER — FOLIC ACID 1 MG PO TABS: 1.0000 mg | ORAL_TABLET | Freq: Every day | ORAL | Status: DC

## 2020-04-24 MED ORDER — HEPARIN SODIUM (PORCINE) 5000 UNIT/ML IJ SOLN
5000.0000 [IU] | Freq: Two times a day (BID) | INTRAMUSCULAR | Status: DC
  Administered 2020-04-24 – 2020-04-26 (×4): 5000 [IU] via SUBCUTANEOUS
  Filled 2020-04-24 (×4): qty 1

## 2020-04-24 MED ORDER — SODIUM CHLORIDE 0.9 % IV SOLN: INTRAVENOUS | Status: DC

## 2020-04-24 MED ORDER — OXYCODONE HCL 5 MG PO TABS
5.0000 mg | ORAL_TABLET | Freq: Four times a day (QID) | ORAL | Status: DC | PRN
  Administered 2020-04-25: 5 mg
  Filled 2020-04-24: qty 1

## 2020-04-24 MED ORDER — LACTATED RINGERS IV BOLUS
2000.0000 mL | Freq: Once | INTRAVENOUS | Status: AC
  Administered 2020-04-24: 15:00:00 1000 mL via INTRAVENOUS

## 2020-04-24 MED ORDER — KETOTIFEN FUMARATE 0.025 % OP SOLN
1.0000 [drp] | Freq: Two times a day (BID) | OPHTHALMIC | Status: DC
  Administered 2020-04-25 – 2020-04-30 (×11): 1 [drp] via OPHTHALMIC
  Filled 2020-04-24 (×2): qty 5

## 2020-04-24 MED ORDER — HYDROXYZINE HCL 10 MG PO TABS
10.0000 mg | ORAL_TABLET | Freq: Three times a day (TID) | ORAL | Status: DC | PRN
  Filled 2020-04-24: qty 1

## 2020-04-24 MED ORDER — ACETAMINOPHEN 650 MG RE SUPP: 650.0000 mg | Freq: Four times a day (QID) | RECTAL | Status: DC | PRN

## 2020-04-24 MED ORDER — OSMOLITE 1.5 CAL PO LIQD
237.0000 mL | Freq: Three times a day (TID) | ORAL | Status: DC
  Filled 2020-04-24 (×6): qty 237

## 2020-04-24 MED ORDER — METRONIDAZOLE IN NACL 5-0.79 MG/ML-% IV SOLN
500.0000 mg | Freq: Once | INTRAVENOUS | Status: AC
  Administered 2020-04-24: 15:00:00 500 mg via INTRAVENOUS
  Filled 2020-04-24: qty 100

## 2020-04-24 MED ORDER — DIPHENHYDRAMINE HCL 12.5 MG/5ML PO ELIX: 12.5000 mg | ORAL_SOLUTION | Freq: Four times a day (QID) | ORAL | Status: DC | PRN

## 2020-04-24 MED ORDER — PANTOPRAZOLE SODIUM 40 MG PO PACK
40.0000 mg | PACK | Freq: Every day | ORAL | Status: DC
  Administered 2020-04-25 – 2020-04-30 (×6): 40 mg
  Filled 2020-04-24 (×9): qty 20

## 2020-04-24 MED ORDER — ONDANSETRON HCL 4 MG PO TABS
4.0000 mg | ORAL_TABLET | Freq: Two times a day (BID) | ORAL | Status: DC | PRN
  Administered 2020-04-24: 4 mg via ORAL
  Filled 2020-04-24: qty 1

## 2020-04-24 MED ORDER — BISACODYL 10 MG RE SUPP: 10.0000 mg | Freq: Every day | RECTAL | Status: DC | PRN

## 2020-04-24 MED ORDER — MELATONIN 3 MG PO TABS
3.0000 mg | ORAL_TABLET | Freq: Every day | ORAL | Status: DC
  Administered 2020-04-24: 3 mg via ORAL
  Filled 2020-04-24: qty 1

## 2020-04-24 MED ORDER — SACCHAROMYCES BOULARDII 250 MG PO CAPS
250.0000 mg | ORAL_CAPSULE | Freq: Two times a day (BID) | ORAL | Status: DC
  Administered 2020-04-24 – 2020-04-25 (×2): 250 mg via ORAL
  Filled 2020-04-24 (×6): qty 1

## 2020-04-24 MED ORDER — BOOST PLUS PO LIQD
237.0000 mL | ORAL | Status: DC
  Filled 2020-04-24 (×2): qty 237

## 2020-04-24 MED ORDER — SODIUM CHLORIDE 0.9 % IV SOLN: 500.0000 mg | INTRAVENOUS | Status: DC

## 2020-04-24 MED ORDER — LIDOCAINE 5 % EX PTCH
1.0000 | MEDICATED_PATCH | CUTANEOUS | Status: DC
  Administered 2020-04-25 – 2020-04-29 (×3): 1 via TRANSDERMAL
  Filled 2020-04-24 (×6): qty 1

## 2020-04-24 MED ORDER — IPRATROPIUM-ALBUTEROL 0.5-2.5 (3) MG/3ML IN SOLN: 3.0000 mL | Freq: Four times a day (QID) | RESPIRATORY_TRACT | Status: DC | PRN

## 2020-04-24 MED ORDER — ONDANSETRON HCL 4 MG/2ML IJ SOLN
4.0000 mg | Freq: Three times a day (TID) | INTRAMUSCULAR | Status: DC | PRN
  Administered 2020-04-26 – 2020-04-28 (×3): 4 mg via INTRAVENOUS
  Filled 2020-04-24 (×3): qty 2

## 2020-04-24 NOTE — ED Triage Notes (Addendum)
Pt from home, brought to ED via [facility] for fever of 103, pt given tylenol 1 gram. Pt with trach and feeding tube. Pt hx throat cancer. Pt also with O2 sats between 86-92% placed on 8L. Pt states she wears 8L at home.

## 2020-04-24 NOTE — Progress Notes (Signed)
Continue to monitor sepsis protocol

## 2020-04-24 NOTE — H&P (Signed)
History and Physical    Veronica Horton FVC:[id number] DOB: 03/21/1961 DOA: 04/24/2020  PCP: [facility] (Confirm with patient/family/NH records and if not entered, this has to be entered at [facility] point of entry) Patient coming from: [facility]  I have personally briefly reviewed patient's old medical records in [place]  Chief Complaint: Fever, cough, increasing short of breath  HPI: Veronica Horton is a 59 y.o. 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  Patient has undergoing radiation and chemotherapy, this week, she finished 1 day of cisplatin on Monday and then 3 more days of radiation therapy completed on Thursday.  Patient unable to talk, most history provided by patient significant other at bedside.  Family reported the patient has been having frequent cough after eating, despite having a PEG tube, patient able to eat by herself which she does, but family reported frequent coughing or choking after eat in the last couple of days, and this morning patient was warm to touch.  They checked her temperature was lower 100.  And it appears that the patient has increasing trach collar secretions.  Patient denied any chest pain, no diarrhea no urinary symptoms.  ED Course: Temperature 99 (patient was given 2 Tylenols at home before coming in) patient was found to have significant hypoxia, stabilized on 2 L.  Blood pressure borderline low, but responded to IV fluid boluses of 2 L.  Chest x-ray showed lungs congested.  WBC 8.5.  Review of Systems: Unable to perform, patient unable to talk.  Past Medical History:  Diagnosis Date  . Bronchitis   . Class 3 obesity 01/23/2020  . COPD (chronic obstructive pulmonary  disease) ([place])   . Degenerative disc disease, lumbar   . Hiatal hernia   . Hypertension     Past Surgical History:  Procedure Laterality Date  . BIOPSY  04/12/2020   Procedure: BIOPSY;  Surgeon: Wilford Corner, MD;  [facility];  Service: Gastroenterology;;  . CHOLECYSTECTOMY    . degenerative bone disease    . DIRECT LARYNGOSCOPY N/A 03/13/2020   Procedure: DIRECT LARYNGOSCOPY WITH BIOPSY;  Surgeon: Jason Coop, DO;  Location: [facility];  Service: ENT;  Laterality: N/A;  . FLEXIBLE SIGMOIDOSCOPY N/A 04/12/2020   Procedure: FLEXIBLE SIGMOIDOSCOPY;  Surgeon: Wilford Corner, MD;  [facility];  Service: Gastroenterology;  Laterality: N/A;  . INCISIONAL HERNIA REPAIR N/A 01/15/2019   Procedure: [name] HERNIORRHAPHY WITH MESH;  Surgeon: [name], MD;  [facility];  Service: General;  Laterality: N/A;  . IR GASTROSTOMY TUBE MOD SED  03/22/2020  . OMENTECTOMY N/A 01/15/2019   Procedure: OMENTECTOMY;  Surgeon: [name], MD;  [facility];  Service: General;  Laterality: N/A;  . POLYPECTOMY  04/12/2020   Procedure: POLYPECTOMY;  Surgeon: Wilford Corner, MD;  [facility];  Service: Gastroenterology;;  . TRACHEOSTOMY TUBE PLACEMENT N/A 03/13/2020   Procedure: AWAKE TRACHEOSTOMY;  Surgeon: Jason Coop, DO;  Location: [facility];  Service: ENT;  Laterality: N/A;     reports that she has been smoking. She has been smoking about 1.00 pack per day. She has never used smokeless tobacco. She reports that she does not drink alcohol and does not use drugs.  Allergies  Allergen Reactions  . Codeine Hives  Reports itching only per RN  . Penicillins Hives    Did it involve swelling of the face/tongue/throat, SOB, or low BP? No Did it involve sudden or severe rash/hives, skin peeling, or any reaction on the inside of your mouth or nose? Yes Did you need to seek medical attention at a hospital or doctor's office? Unknown When did it  last happen?Over 10 years If all above answers are "NO", may proceed with cephalosporin use.     Family History  Problem Relation Age of Onset  . Hypertension Mother   . Sudden Cardiac Death Neg Hx      Prior to Admission medications   Medication Sig Start Date End Date Taking? Authorizing Provider  arformoterol (BROVANA) 15 MCG/2ML NEBU Take 2 mLs (15 mcg total) by nebulization 2 (two) times daily. 04/16/20   Pokhrel, Corrie Mckusick, MD  bisacodyl (DULCOLAX) 10 MG suppository Place 1 suppository (10 mg total) rectally daily as needed for severe constipation. 04/16/20   Pokhrel, Laxman, MD  budesonide (PULMICORT) 0.5 MG/2ML nebulizer solution Take 2 mLs (0.5 mg total) by nebulization 2 (two) times daily. 04/16/20   Pokhrel, Corrie Mckusick, MD  diclofenac Sodium (VOLTAREN) 1 % GEL Apply 2 g topically 4 (four) times daily as needed (neck/back pain). 04/16/20   Pokhrel, Corrie Mckusick, MD  diphenhydrAMINE (BENADRYL) 12.5 MG/5ML elixir Place 5 mLs (12.5 mg total) into feeding tube every 6 (six) hours as needed for itching. 04/16/20   Pokhrel, Corrie Mckusick, MD  feeding supplement, ENSURE ENLIVE, (ENSURE ENLIVE) LIQD Take 237 mLs by mouth 2 (two) times daily between meals. 01/25/20   Manuella Ghazi, Pratik D, DO  folic acid (FOLVITE) 1 MG tablet Take 1 tablet (1 mg total) by mouth daily. 01/31/19   [name], MD  gabapentin (NEURONTIN) 300 MG capsule Take 2 capsules (600 mg total) by mouth at bedtime. 04/16/20   Pokhrel, Corrie Mckusick, MD  guaiFENesin-dextromethorphan (ROBITUSSIN DM) 100-10 MG/5ML syrup Take 10 mLs by mouth every 4 (four) hours as needed for cough. 04/16/20   Pokhrel, Corrie Mckusick, MD  hydrOXYzine (ATARAX/VISTARIL) 10 MG tablet Place 1 tablet (10 mg total) into feeding tube 3 (three) times daily as needed for anxiety. 04/16/20   Pokhrel, Laxman, MD  ipratropium-albuterol (DUONEB) 0.5-2.5 (3) MG/3ML SOLN Take 3 mLs by nebulization every 6 (six) hours as needed. 04/16/20 05/16/20  Pokhrel, Corrie Mckusick, MD  ketotifen (ZADITOR) 0.025  % ophthalmic solution Place 1 drop into both eyes 2 (two) times daily. 04/16/20   Pokhrel, Corrie Mckusick, MD  lactose free nutrition (BOOST PLUS) LIQD Take 237 mLs by mouth daily. 04/16/20 05/16/20  Pokhrel, Corrie Mckusick, MD  lidocaine (LIDODERM) 5 % Place 1 patch onto the skin daily. Remove & Discard patch within 12 hours or as directed by MD 04/16/20   Flora Lipps, MD  lisinopril (ZESTRIL) 20 MG tablet Take 1 tablet (20 mg total) by mouth daily. 01/31/19   [name], MD  melatonin 5 MG TABS Take 1 tablet (5 mg total) by mouth at bedtime. 04/16/20   Pokhrel, Corrie Mckusick, MD  nicotine (NICODERM CQ - DOSED IN MG/24 HR) 7 mg/24hr patch Place 1 patch (7 mg total) onto the skin daily. Patient not taking: Reported on 03/14/2020 01/26/20   Heath Lark D, DO  Nutritional Supplements (FEEDING SUPPLEMENT, OSMOLITE 1.5 CAL,) LIQD Place 237 mLs into feeding tube 3 (three) times daily between meals. 04/16/20 05/16/20  Pokhrel, Corrie Mckusick, MD  ondansetron (ZOFRAN) 8 MG tablet Take 1 tablet (8 mg total) by mouth 2 (two) times daily as needed. Start on the third  day after chemotherapy. 04/15/20   Benay Pike, MD  oxyCODONE (OXY IR/ROXICODONE) 5 MG immediate release tablet Place 1 tablet (5 mg total) into feeding tube every 6 (six) hours as needed for severe pain. 04/16/20   Pokhrel, Corrie Mckusick, MD  pantoprazole sodium (PROTONIX) 40 mg/20 mL PACK Place 20 mLs (40 mg total) into feeding tube daily. 04/16/20 05/16/20  Pokhrel, Corrie Mckusick, MD  polyethylene glycol (MIRALAX / GLYCOLAX) 17 g packet Place 17 g into feeding tube 2 (two) times daily as needed for moderate constipation. 04/16/20   Pokhrel, Corrie Mckusick, MD  prochlorperazine (COMPAZINE) 10 MG tablet Take 1 tablet (10 mg total) by mouth every 6 (six) hours as needed (Nausea or vomiting). 04/15/20   Benay Pike, MD  saccharomyces boulardii (FLORASTOR) 250 MG capsule Take 1 capsule (250 mg total) by mouth 2 (two) times daily. Patient not taking: Reported on 01/23/2020 01/30/19   [name], MD  scopolamine (TRANSDERM-SCOP) 1 MG/3DAYS Place 1 patch (1.5 mg total) onto the skin every 3 (three) days. 04/18/20   Pokhrel, Corrie Mckusick, MD  vitamin B-12 (CYANOCOBALAMIN) 500 MCG tablet Take 1 tablet (500 mcg total) by mouth daily. Patient not taking: Reported on 01/23/2020 01/31/19   [name], MD    Physical Exam: Vitals:   04/24/20 1700 04/24/20 1710 04/24/20 1730 04/24/20 1800  BP: 117/60  110/61 107/60  Pulse: 64 62    Resp: (!) 23 16 15    Temp:      TempSrc:      SpO2: (!) 89% 93%    Weight:      Height:        Constitutional: NAD, calm, comfortable Vitals:   04/24/20 1700 04/24/20 1710 04/24/20 1730 04/24/20 1800  BP: 117/60  110/61 107/60  Pulse: 64 62    Resp: (!) 23 16 15    Temp:      TempSrc:      SpO2: (!) 89% 93%    Weight:      Height:       Eyes: PERRL, lids and conjunctivae normal ENMT: Mucous membranes are dry. Posterior pharynx clear of any exudate or lesions.Normal dentition.  Neck: normal, supple, no masses, no thyromegaly Respiratory: clear to auscultation bilaterally, crackles on B/L bases.  Increasing respiratory effort. No accessory muscle use.  Cardiovascular: Regular rate and rhythm, no murmurs / rubs / gallops. 2+ extremity edema. 2+ pedal pulses. No carotid bruits.  Abdomen: no tenderness, no masses palpated. No hepatosplenomegaly. Bowel sounds positive.  Musculoskeletal: no clubbing / cyanosis. No joint deformity upper and lower extremities. Good ROM, no contractures. Normal muscle tone.  Skin: no rashes, lesions, ulcers. No induration Neurologic: CN 2-12 grossly intact. Sensation intact, DTR normal. Strength 5/5 in all 4.  Psychiatric: Normal judgment and insight. Alert and oriented x 3. Normal mood.     Labs on Admission: I have personally reviewed following labs and imaging studies  CBC: Recent Labs  Lab 04/24/20 1534  WBC 8.5  NEUTROABS 7.1  HGB 10.2*  HCT 32.9*  MCV 99.7  PLT 497   Basic Metabolic Panel: Recent  Labs  Lab 04/24/20 1534  NA 134*  K 5.0  CL 98  CO2 29  GLUCOSE 108*  BUN 25*  CREATININE 1.79*  CALCIUM 8.4*   GFR: Estimated Creatinine Clearance: 42.5 mL/min (A) (by C-G formula based on SCr of 1.79 mg/dL (H)). Liver Function Tests: Recent Labs  Lab 04/24/20 1534  AST 13*  ALT 11  ALKPHOS 42  BILITOT 0.9  PROT 6.3*  ALBUMIN 3.0*   No results for input(s): LIPASE, AMYLASE in the last 168 hours. No results for input(s): AMMONIA in the last 168 hours. Coagulation Profile: Recent Labs  Lab 04/24/20 1534  INR 1.2   Cardiac Enzymes: No results for input(s): CKTOTAL, CKMB, CKMBINDEX, TROPONINI in the last 168 hours. BNP (last 3 results) No results for input(s): PROBNP in the last 8760 hours. HbA1C: No results for input(s): HGBA1C in the last 72 hours. CBG: No results for input(s): GLUCAP in the last 168 hours. Lipid Profile: No results for input(s): CHOL, HDL, LDLCALC, TRIG, CHOLHDL, LDLDIRECT in the last 72 hours. Thyroid Function Tests: No results for input(s): TSH, T4TOTAL, FREET4, T3FREE, THYROIDAB in the last 72 hours. Anemia Panel: No results for input(s): VITAMINB12, FOLATE, FERRITIN, TIBC, IRON, RETICCTPCT in the last 72 hours. Urine analysis:    Component Value Date/Time   COLORURINE YELLOW 01/13/2019 1730   APPEARANCEUR CLOUDY (A) 01/13/2019 1730   LABSPEC 1.017 01/13/2019 1730   PHURINE 8.0 01/13/2019 1730   GLUCOSEU NEGATIVE 01/13/2019 1730   HGBUR NEGATIVE 01/13/2019 1730   BILIRUBINUR NEGATIVE 01/13/2019 1730   KETONESUR NEGATIVE 01/13/2019 1730   PROTEINUR 100 (A) 01/13/2019 1730   UROBILINOGEN 0.2 09/25/2010 0530   NITRITE NEGATIVE 01/13/2019 1730   LEUKOCYTESUR NEGATIVE 01/13/2019 1730    Radiological Exams on Admission: CT ABDOMEN PELVIS W CONTRAST  Result Date: 04/24/2020 CLINICAL DATA:  Abdominal pain, fever, for surge EXAM: CT ABDOMEN AND PELVIS WITH CONTRAST TECHNIQUE: Multidetector CT imaging of the abdomen and pelvis was  performed using the standard protocol following bolus administration of intravenous contrast. CONTRAST:  40mL OMNIPAQUE IOHEXOL 300 MG/ML  SOLN COMPARISON:  04/09/2020 FINDINGS: Lower chest: Bandlike scarring and atelectasis of the dependent bilateral lung bases. Multiple nonacute fractures of the posterior left ribs. Hepatobiliary: No focal liver abnormality is seen. Status post cholecystectomy. No biliary dilatation. Pancreas: Unremarkable. No pancreatic ductal dilatation or surrounding inflammatory changes. Spleen: Normal in size without significant abnormality. Adrenals/Urinary Tract: Adrenal glands are unremarkable. Kidneys are normal, without renal calculi, solid lesion, or hydronephrosis. Bladder is unremarkable. Stomach/Bowel: Percutaneous gastrostomy, balloon within the gastric lumen. Appendix appears normal. No evidence of bowel wall thickening, distention, or inflammatory changes. Vascular/Lymphatic: Aortic atherosclerosis. No enlarged abdominal or pelvic lymph nodes. Reproductive: No mass or other significant abnormality. Other: Broad-based midline ventral abdominal hernia, status post mesh repair. Redemonstrated recurrent inferior hernia component, containing a single, nonobstructed loop of mid small bowel (series 2, image 62). No abdominopelvic ascites. Musculoskeletal: No acute or significant osseous findings. IMPRESSION: 1. No acute CT findings of the abdomen or pelvis to explain abdominal pain. No evidence infectious nidus internal to the abdomen or pelvis. 2. Previously identified perirectal fat stranding is resolved, consistent with resolution of infectious or inflammatory proctitis. 3. Broad-based midline ventral abdominal hernia, status post mesh repair. Redemonstrated recurrent inferior hernia component, containing a single, nonobstructed loop of mid small bowel. Aortic Atherosclerosis (ICD10-I70.0). Electronically Signed   By: Eddie Candle M.D.   On: 04/24/2020 17:05   DG Chest Port 1  View  Result Date: 04/24/2020 CLINICAL DATA:  Questionable sepsis EXAM: PORTABLE CHEST 1 VIEW COMPARISON:  04/11/2020, CT chest, 04/13/2020 FINDINGS: Cardiomegaly. Tracheostomy. Bandlike scarring or atelectasis of the right midlung. There is a background of diffuse, coarse appearing interstitial opacity. No acute appearing airspace opacity. IMPRESSION: 1.  Cardiomegaly. 2.  Tracheostomy. 3. Bandlike scarring or atelectasis of the right midlung. There is a background of diffuse, coarse appearing interstitial opacity, generally corresponding to scarring and  atelectasis seen on prior CT. No acute appearing airspace opacity. Electronically Signed   By: Eddie Candle M.D.   On: 04/24/2020 15:23    EKG: Independently reviewed.  Sinus, low voltage  Assessment/Plan Active Problems:   PNA (pneumonia)   Sepsis ([place])  (please populate well all problems here in Problem List. (For example, if patient is on BP meds at home and you resume or decide to hold them, it is a problem that needs to be her. Same for CAD, COPD, HLD and so on)  Sepsis -Evidenced by low blood pressure, elevated temperature and worsening hypoxia, source is considered to be aspiration pneumonia/pneumonitis, blood pressure low but responded to IV fluid and also with evidence of endorgan damage of AKI. -Antibiotics ceftriaxone plus Flagyl -Discussed with ED physician, given patient has complicated laryngeal cancer and now suspicious for worsening of dysphagia, also expect patient will need more cancer-related care, will admit to [facility] long hospital. -N.p.o. for now, consult speech, also ordered a barium swallow. -Sputum culture  Acute on chronic hypoxic respiratory failure -As above, wean down oxygen  AKI -Secondary to sepsis, continue maintenance IV fluid -Recheck BMP in a.m.  COPD -No symptoms signs of acute exacerbation, continue home breathing meds regimen.  Laryngeal invasive CA -Consult nutrition for starting PEG tube  feeding. -Outpatient oncology follow-up  Peripheral edema -Echo showed normal LVEF, DVT study last week showed only superficial thrombosis of right cephalic vein -Limb elevation and consider diuresis once blood pressure more stabilized and kidney function allows.  DVT prophylaxis: Heparin subcu  code Status: Full code Family Communication: Husband at bedside Disposition Plan: Expect more than 2 midnight hospital stay to treat aspiration pneumonia and wean down oxygen Consults called: None Admission status: PCU   Lequita Halt MD [facility] Pager [phone]  04/24/2020, 6:36 PM

## 2020-04-24 NOTE — ED Notes (Signed)
Pt c throat cancer   Recent chemo 12/20   Here for fever, also recent covid vaccine   IV x 2  Sepsis protocol  Suction by resp  Paper and clip board for communication

## 2020-04-24 NOTE — ED Provider Notes (Signed)
[facility] Emergency Department Provider Note MRN:  [id number]  Arrival date & time: 04/24/20     Chief Complaint   Fever   History of Present Illness   ALEXIYA FRANQUI is a 59 y.o. year-old female with a history of hypertension, COPD, laryngeal cancer presenting to the ED with chief complaint of fever.  Reported fever at care facility.  Patient is endorsing abdominal pain.  Recent extensive hospitalization for hypoxia in the setting of COPD exacerbation, also found to have laryngeal cancer and now has a tracheostomy tube.  I was unable to obtain an accurate HPI, PMH, or ROS due to the patient's trach dependence.  Level 5 caveat.  Review of Systems  Positive for fever, abdominal pain.  Patient's Health History    Past Medical History:  Diagnosis Date  . Bronchitis   . Class 3 obesity 01/23/2020  . COPD (chronic obstructive pulmonary disease) ([employer])   . Degenerative disc disease, lumbar   . Hiatal hernia   . Hypertension     Past Surgical History:  Procedure Laterality Date  . BIOPSY  04/12/2020   Procedure: BIOPSY;  Surgeon: Wilford Corner, MD;  [facility];  Service: Gastroenterology;;  . CHOLECYSTECTOMY    . degenerative bone disease    . DIRECT LARYNGOSCOPY N/A 03/13/2020   Procedure: DIRECT LARYNGOSCOPY WITH BIOPSY;  Surgeon: Jason Coop, DO;  Location: [facility];  Service: ENT;  Laterality: N/A;  . FLEXIBLE SIGMOIDOSCOPY N/A 04/12/2020   Procedure: FLEXIBLE SIGMOIDOSCOPY;  Surgeon: Wilford Corner, MD;  [facility];  Service: Gastroenterology;  Laterality: N/A;  . INCISIONAL HERNIA REPAIR N/A 01/15/2019   Procedure: [name] HERNIORRHAPHY WITH MESH;  Surgeon: [name], MD;  [facility];  Service: General;  Laterality: N/A;  . IR GASTROSTOMY TUBE MOD SED  03/22/2020  . OMENTECTOMY N/A 01/15/2019   Procedure: OMENTECTOMY;  Surgeon: [name], MD;  [facility];  Service: General;  Laterality: N/A;   . POLYPECTOMY  04/12/2020   Procedure: POLYPECTOMY;  Surgeon: Wilford Corner, MD;  [facility];  Service: Gastroenterology;;  . TRACHEOSTOMY TUBE PLACEMENT N/A 03/13/2020   Procedure: AWAKE TRACHEOSTOMY;  Surgeon: Jason Coop, DO;  Location: [facility];  Service: ENT;  Laterality: N/A;    Family History  Problem Relation Age of Onset  . Hypertension Mother   . Sudden Cardiac Death Neg Hx     Social History   Socioeconomic History  . Marital status: Divorced    Spouse name: Not on file  . Number of children: Not on file  . Years of education: Not on file  . Highest education level: Not on file  Occupational History  . Not on file  Tobacco Use  . Smoking status: Current Every Day Smoker    Packs/day: 1.00  . Smokeless tobacco: Never Used  Substance and Sexual Activity  . Alcohol use: No  . Drug use: No  . Sexual activity: Yes    Birth control/protection: Post-menopausal  Other Topics Concern  . Not on file  Social History Narrative  . Not on file   Social Determinants of Health   Financial Resource Strain: Not on file  Food Insecurity: Not on file  Transportation Needs: Not on file  Physical Activity: Not on file  Stress: Not on file  Social Connections: Not on file  Intimate Partner Violence: Not on file     Physical Exam   Vitals:   04/24/20 1700 04/24/20 1710  BP: 117/60  Pulse: 64 62  Resp: (!) 23 16  Temp:    SpO2: (!) 89% 93%    CONSTITUTIONAL: Chronically ill-appearing, NAD NEURO:  Alert and oriented x 3, no focal deficits EYES:  eyes equal and reactive ENT/NECK:  no LAD, no JVD, tracheostomy tube in place CARDIO: Regular rate, well-perfused, normal S1 and S2 PULM:  CTAB no wheezing or rhonchi GI/GU:  normal bowel sounds, non-distended, moderate diffuse tenderness, G-tube in place with minimal erythema at the stoma MSK/SPINE:  No gross deformities, no edema SKIN:  no rash, atraumatic PSYCH:  Appropriate speech and  behavior  *Additional and/or pertinent findings included in MDM below  Diagnostic and Interventional Summary    EKG Interpretation  Date/Time:  Saturday April 24 2020 14:54:18 EST Ventricular Rate:  79 PR Interval:    QRS Duration: 98 QT Interval:  366 QTC Calculation: 420 R Axis:   81 Text Interpretation: Sinus rhythm Borderline right axis deviation Low voltage, precordial leads Borderline T abnormalities, anterior leads Baseline wander in lead(s) II III aVR aVF V4 V6 Confirmed by Gerlene Fee [phone]) on 04/24/2020 5:36:15 PM      Labs Reviewed  COMPREHENSIVE METABOLIC PANEL - Abnormal; Notable for the following components:      Result Value   Sodium 134 (*)    Glucose, Bld 108 (*)    BUN 25 (*)    Creatinine, Ser 1.79 (*)    Calcium 8.4 (*)    Total Protein 6.3 (*)    Albumin 3.0 (*)    AST 13 (*)    GFR, Estimated 32 (*)    All other components within normal limits  CBC WITH DIFFERENTIAL/PLATELET - Abnormal; Notable for the following components:   RBC 3.30 (*)    Hemoglobin 10.2 (*)    HCT 32.9 (*)    RDW 15.7 (*)    Lymphs Abs 0.5 (*)    All other components within normal limits  RESP PANEL BY RT-PCR (FLU A&B, COVID) [id number]  CULTURE, BLOOD (ROUTINE X 2)  CULTURE, BLOOD (ROUTINE X 2)  URINE CULTURE  LACTIC ACID, PLASMA  PROTIME-INR  APTT  LACTIC ACID, PLASMA  URINALYSIS, ROUTINE W REFLEX MICROSCOPIC    CT ABDOMEN PELVIS W CONTRAST  Final Result    DG Chest Port 1 View  Final Result      Medications  lactated ringers bolus 2,000 mL (0 mLs Intravenous Stopped 04/24/20 1731)  ciprofloxacin (CIPRO) IVPB 400 mg (0 mg Intravenous Stopped 04/24/20 1717)  metroNIDAZOLE (FLAGYL) IVPB 500 mg (0 mg Intravenous Stopped 04/24/20 1717)  iohexol (OMNIPAQUE) 300 MG/ML solution 100 mL (75 mLs Intravenous Contrast Given 04/24/20 1639)     Procedures  /  Critical Care .Critical Care Performed by: Maudie Flakes, MD Authorized by: Maudie Flakes, MD   Critical  care provider statement:    Critical care time (minutes):  36   Critical care was necessary to treat or prevent imminent or life-threatening deterioration of the following conditions:  Sepsis   Critical care was time spent personally by me on the following activities:  Discussions with consultants, evaluation of patient's response to treatment, examination of patient, ordering and performing treatments and interventions, ordering and review of laboratory studies, ordering and review of radiographic studies, pulse oximetry, re-evaluation of patient's condition, obtaining history from patient or surrogate and review of old charts    ED Course and Medical Decision Making  I have reviewed the triage vital signs, the nursing notes, and pertinent available records from the  EMR.  Listed above are laboratory and imaging tests that I personally ordered, reviewed, and interpreted and then considered in my medical decision making (see below for details).  Fever, abdominal pain, chronically ill individual with recent extensive hospitalization, laryngeal cancer with trach dependence.  Code sepsis initiated, blood pressure initially soft, providing 2 L IV fluids and will reassess.     Good response to IV fluids.  CT abdomen without acute process.  Patient with increased sputum and oxygen saturation downtrending into the 80s on her baseline 8 L, now up to 10 L.  Question of pulmonary source.  Admitted to hospitalist service for further care.  Barth Kirks. [place]onia Small, [place] [email or web address]  Final Clinical Impressions(s) / ED Diagnoses     ICD-10-CM   1. Sepsis, due to unspecified organism, unspecified whether acute organ dysfunction present [facility])  A41.9     ED Discharge Orders    None       Discharge Instructions Discussed with and Provided to Patient:   Discharge Instructions   None       Maudie Flakes, MD 04/24/20 1737

## 2020-04-24 NOTE — Progress Notes (Signed)
Pt received from [facility] at approximately 2210.  Pt placed on 60% atc.  Pt tracheal suctioned for sputum and sample sent to lab for processing.

## 2020-04-24 NOTE — ED Notes (Signed)
Call to Resp for Sats less than 85 per cent   She is enroute

## 2020-04-24 NOTE — ED Notes (Signed)
Dr Sedonia Small in to speak w pt and SO

## 2020-04-24 NOTE — Progress Notes (Signed)
elink monitoring for sepsis. 

## 2020-04-25 LAB — BASIC METABOLIC PANEL
Anion gap: 9 (ref 5–15)
BUN: 23 mg/dL — ABNORMAL HIGH (ref 6–20)
CO2: 27 mmol/L (ref 22–32)
Calcium: 8.1 mg/dL — ABNORMAL LOW (ref 8.9–10.3)
Chloride: 99 mmol/L (ref 98–111)
Creatinine, Ser: 1.62 mg/dL — ABNORMAL HIGH (ref 0.44–1.00)
GFR, Estimated: 36 mL/min — ABNORMAL LOW (ref >60)
Glucose, Bld: 85 mg/dL (ref 70–99)
Potassium: 4.4 mmol/L (ref 3.5–5.1)
Sodium: 135 mmol/L (ref 135–145)

## 2020-04-25 LAB — CBC WITH DIFFERENTIAL/PLATELET
Abs Immature Granulocytes: 0.04 10*3/uL (ref 0.00–0.07)
Basophils Absolute: 0 10*3/uL (ref 0.0–0.1)
Basophils Relative: 0 %
Eosinophils Absolute: 0 10*3/uL (ref 0.0–0.5)
Eosinophils Relative: 1 %
HCT: 29.8 % — ABNORMAL LOW (ref 36.0–46.0)
Hemoglobin: 9.5 g/dL — ABNORMAL LOW (ref 12.0–15.0)
Immature Granulocytes: 1 %
Lymphocytes Relative: 10 %
Lymphs Abs: 0.5 10*3/uL — ABNORMAL LOW (ref 0.7–4.0)
MCH: 31.7 pg (ref 26.0–34.0)
MCHC: 31.9 g/dL (ref 30.0–36.0)
MCV: 99.3 fL (ref 80.0–100.0)
Monocytes Absolute: 0.7 10*3/uL (ref 0.1–1.0)
Monocytes Relative: 14 %
Neutro Abs: 3.5 10*3/uL (ref 1.7–7.7)
Neutrophils Relative %: 74 %
Platelets: 137 10*3/uL — ABNORMAL LOW (ref 150–400)
RBC: 3 MIL/uL — ABNORMAL LOW (ref 3.87–5.11)
RDW: 15.7 % — ABNORMAL HIGH (ref 11.5–15.5)
WBC: 4.8 10*3/uL (ref 4.0–10.5)
nRBC: 0 % (ref 0.0–0.2)

## 2020-04-25 LAB — COMPREHENSIVE METABOLIC PANEL
ALT: 10 U/L (ref 0–44)
AST: 12 U/L — ABNORMAL LOW (ref 15–41)
Albumin: 2.7 g/dL — ABNORMAL LOW (ref 3.5–5.0)
Alkaline Phosphatase: 38 U/L (ref 38–126)
Anion gap: 8 (ref 5–15)
BUN: 24 mg/dL — ABNORMAL HIGH (ref 6–20)
CO2: 28 mmol/L (ref 22–32)
Calcium: 8.3 mg/dL — ABNORMAL LOW (ref 8.9–10.3)
Chloride: 99 mmol/L (ref 98–111)
Creatinine, Ser: 1.75 mg/dL — ABNORMAL HIGH (ref 0.44–1.00)
GFR, Estimated: 33 mL/min — ABNORMAL LOW (ref >60)
Glucose, Bld: 88 mg/dL (ref 70–99)
Potassium: 4.6 mmol/L (ref 3.5–5.1)
Sodium: 135 mmol/L (ref 135–145)
Total Bilirubin: 0.8 mg/dL (ref 0.3–1.2)
Total Protein: 5.7 g/dL — ABNORMAL LOW (ref 6.5–8.1)

## 2020-04-25 LAB — CORTISOL-PM, BLOOD: Cortisol - PM: 24.8 ug/dL — ABNORMAL HIGH (ref <10.0)

## 2020-04-25 LAB — GLUCOSE, CAPILLARY
Glucose-Capillary: 77 mg/dL (ref 70–99)
Glucose-Capillary: 119 mg/dL — ABNORMAL HIGH (ref 70–99)

## 2020-04-25 LAB — CBC
HCT: 27.9 % — ABNORMAL LOW (ref 36.0–46.0)
Hemoglobin: 9 g/dL — ABNORMAL LOW (ref 12.0–15.0)
MCH: 32 pg (ref 26.0–34.0)
MCHC: 32.3 g/dL (ref 30.0–36.0)
MCV: 99.3 fL (ref 80.0–100.0)
Platelets: 141 10*3/uL — ABNORMAL LOW (ref 150–400)
RBC: 2.81 MIL/uL — ABNORMAL LOW (ref 3.87–5.11)
RDW: 15.6 % — ABNORMAL HIGH (ref 11.5–15.5)
WBC: 4.7 10*3/uL (ref 4.0–10.5)
nRBC: 0 % (ref 0.0–0.2)

## 2020-04-25 LAB — LACTIC ACID, PLASMA: Lactic Acid, Venous: 0.8 mmol/L (ref 0.5–1.9)

## 2020-04-25 LAB — MAGNESIUM: Magnesium: 1.9 mg/dL (ref 1.7–2.4)

## 2020-04-25 MED ORDER — HYDROCODONE-ACETAMINOPHEN 7.5-325 MG/15ML PO SOLN
10.0000 mL | Freq: Four times a day (QID) | ORAL | Status: DC | PRN
  Administered 2020-04-25 – 2020-04-30 (×8): 10 mL
  Filled 2020-04-25 (×8): qty 15

## 2020-04-25 MED ORDER — GUAIFENESIN-DM 100-10 MG/5ML PO SYRP
5.0000 mL | ORAL_SOLUTION | Freq: Four times a day (QID) | ORAL | Status: DC
  Administered 2020-04-25 – 2020-04-30 (×19): 5 mL
  Filled 2020-04-25 (×19): qty 10

## 2020-04-25 MED ORDER — ADULT MULTIVITAMIN LIQUID CH
15.0000 mL | Freq: Every day | ORAL | Status: DC
  Administered 2020-04-25 – 2020-04-30 (×6): 15 mL
  Filled 2020-04-25 (×6): qty 15

## 2020-04-25 MED ORDER — GABAPENTIN 250 MG/5ML PO SOLN
300.0000 mg | Freq: Every day | ORAL | Status: DC
  Administered 2020-04-25 – 2020-04-29 (×5): 300 mg
  Filled 2020-04-25 (×7): qty 6

## 2020-04-25 MED ORDER — SODIUM CHLORIDE 0.9 % IV SOLN: INTRAVENOUS | Status: DC

## 2020-04-25 MED ORDER — NYSTATIN 100000 UNIT/ML MT SUSP
5.0000 mL | Freq: Four times a day (QID) | OROMUCOSAL | Status: DC
  Administered 2020-04-26 – 2020-04-30 (×19): 500000 [IU] via OROMUCOSAL
  Filled 2020-04-25 (×19): qty 5

## 2020-04-25 MED ORDER — OSMOLITE 1.5 CAL PO LIQD
237.0000 mL | Freq: Every day | ORAL | Status: DC
  Administered 2020-04-25 – 2020-04-30 (×26): 237 mL
  Filled 2020-04-25 (×32): qty 237

## 2020-04-25 MED ORDER — PROSOURCE TF PO LIQD
45.0000 mL | Freq: Two times a day (BID) | ORAL | Status: DC
  Administered 2020-04-25 – 2020-04-30 (×9): 45 mL
  Filled 2020-04-25 (×11): qty 45

## 2020-04-25 MED ORDER — DEXTROMETHORPHAN POLISTIREX ER 30 MG/5ML PO SUER
30.0000 mg | Freq: Two times a day (BID) | ORAL | Status: DC
  Administered 2020-04-25: 30 mg
  Filled 2020-04-25: qty 5

## 2020-04-25 MED ORDER — POLYETHYLENE GLYCOL 3350 17 G PO PACK
17.0000 g | PACK | Freq: Every day | ORAL | Status: DC
  Administered 2020-04-25: 09:00:00 17 g
  Filled 2020-04-25: qty 1

## 2020-04-25 MED ORDER — OSMOLITE 1.2 CAL PO LIQD: 1000.0000 mL | ORAL | Status: DC

## 2020-04-25 NOTE — Progress Notes (Signed)
Initial Nutrition Assessment  DOCUMENTATION CODES:   Morbid obesity  INTERVENTION:  D/c Boost Plus  Bolus Osmolite 1.5 - 1 carton (237 ml) 6 times daily with 45 ml ProSource TF BID via PEG tube  90 ml free water flush before and after each feeding  -This will provide 2210 kcal, 111 grams of protein, and 1086 ml free water (2166 total free water with flushes)   NUTRITION DIAGNOSIS:   Inadequate oral intake related to cancer and cancer related treatments,dysphagia as evidenced by  (s/p PEG for supplemental nutrition/hydration).    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  RD working remotely.  Unable to obtain nutrition history as pt is unable to talk. Per notes, patient continues to eat orally with reports of frequent coughing or choking after eating in the last couple of days. Patient is currently NPO pending MBS.   Patient followed by outpt nutrition at clinic. Per notes, patient giving 1 carton (237 ml) Osmolite 1.5 QID with 60 ml water before and after feedings and eating soft, finely chopped foods for additional calories and protein as well as po hydration. Will increase bolus feedings to 6x daily to better meet her needs given current NPO order.   Per chart, weights have increased ~9 lbs in the last 3 months.   Mild pitting BLE edema noted.  Medications reviewed and include: Gabapentin, Melatonin, MVI liquid, Protonix, Florastor, Rocephin  Labs: BUN 24 (H), Cr 1.75  (H)   NUTRITION - FOCUSED PHYSICAL EXAM: Unable to complete at this time   Diet Order:   Diet Order            Diet NPO time specified  Diet effective now                 EDUCATION NEEDS:   Not appropriate for education at this time  Skin:  Skin Assessment: Skin Integrity Issues: Skin Integrity Issues:: Incisions Incisions: closed; neck Other: MASD; R/L thigh  Last BM:  12/24  Height:   Ht Readings from Last 1 Encounters:  04/24/20 5\' 5"  (1.651 m)    Weight:   Wt Readings from Last 1 Encounters:  04/24/20 115.9 kg    BMI:  Body mass index is 42.52 kg/m.  Estimated Nutritional Needs:   Kcal:  2150-2400  Protein:  95-115  Fluid:  >/= 2.1 L/day   Lajuan Lines, RD, LDN Clinical Nutrition After Hours/Weekend Pager # in [phone]

## 2020-04-25 NOTE — Progress Notes (Signed)
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 Presents with concerns for aspiration pneumonia. Currently plan is continue current care.  Assessment and Plan: 1.  Sepsis secondary to aspiration pneumonia, POA Acute on chronic hypoxic respiratory failure, POA Meeting SIRS criteria on admission with hypotension, fever, worsening hypoxia. Currently requiring 60% FiO2. Patient was given IV fluids. Currently started on ceftriaxone and Flagyl. Remains NPO. Speech therapy consulted, MBS tomorrow. Follow-up on cultures. Supportive care for cough  2.  Acute kidney injury Renal function improving.  Monitor.  Will reduce the rate of the IV fluids given concern with aspiration.  3.  COPD Continue inhalers.  4.  Laryngeal invasive carcinoma Outpatient follow-up with oncology. Monitor.  5.  Chronic venous insufficiency Currently stable. Has chronic swelling in the leg. Also reports chronic leg pain. Doppler last week showed SVT on the right leg. Monitor.  6.  Morbid obesity Patient is a patient at high risk for poor outcome. Currently actually has tracheostomy which will improve airway resistance Body mass index is 42.52 kg/m.  Nutrition Problem: Inadequate oral intake Etiology: cancer and cancer related treatments,dysphagia Interventions: Interventions: MVI,Tube feeding      Diet: N.p.o. DVT Prophylaxis:   heparin injection 5,000 Units Start: 04/24/20 2200    Advance goals of care discussion: Limited code  Family Communication: no family was present at bedside, at the time of interview.   Disposition:  Status is: Inpatient  Remains  inpatient appropriate because:IV treatments appropriate due to intensity of illness or inability to take PO and Inpatient level of care appropriate due to severity of illness  Dispo: The patient is from: Home              Anticipated d/c is to: Home              Anticipated d/c date is: > 3 days              Patient currently is not medically stable to d/c.  Subjective: Continues to have cough and shortness of breath but no nausea no vomiting but no fever no chills.  Has questions about her radiation and chemotherapy.  Physical Exam:  General: Appear in mild distress, no Rash; Oral Mucosa Clear, moist. no Abnormal Neck Mass Or lumps, Conjunctiva normal  Cardiovascular: S1 and S2 Present, no Murmur, Respiratory: increased respiratory effort, Bilateral Air entry present and bilateral  Crackles, no wheezes Abdomen: Bowel Sound present, Soft and no tenderness Extremities: trace Pedal edema Neurology: alert and oriented to time, place, and person affect appropriate. no new focal deficit Gait not checked due to patient safety concerns  Vitals:   04/25/20 0742 04/25/20 1150 04/25/20 1337 04/25/20 1951  BP:   134/77   Pulse:  (!) 54 (!) 53 (!) 59  Resp:    18  Temp:   98.3 F (36.8 C)   TempSrc:   Oral   SpO2: 93% 95% 97% 92%  Weight:      Height:        Intake/Output Summary (Last 24 hours) at 04/25/2020 2005 Last data filed at 04/25/2020 1800 Gross per 24 hour  Intake 2042.83 ml  Output --  Net 2042.83 ml  Filed Weights   04/24/20 1448  Weight: 115.9 kg    Data Reviewed: I have personally reviewed and interpreted daily labs, tele strips, imagings as discussed above. I reviewed all nursing notes, pharmacy notes, vitals, pertinent old records I have discussed plan of care as described above with RN and patient/family.  CBC: Recent Labs  Lab 04/24/20 1534 04/25/20 0810  WBC 8.5 4.8  NEUTROABS 7.1 3.5  HGB 10.2* 9.5*  HCT 32.9* 29.8*  MCV 99.7 99.3  PLT 187 137*    Basic Metabolic Panel: Recent Labs  Lab 04/24/20 1534 04/25/20 0810  NA 134* 135  K 5.0 4.6  CL 98 99  CO2 29 28  GLUCOSE 108* 88  BUN 25* 24*  CREATININE 1.79* 1.75*  CALCIUM 8.4* 8.3*  MG  --  1.9    Studies: No results found.  Scheduled Meds: . arformoterol  15 mcg Nebulization BID  . budesonide  0.5 mg Nebulization BID  . feeding supplement (OSMOLITE 1.5 CAL)  237 mL Per Tube 6 X Daily  . feeding supplement (PROSource TF)  45 mL Per Tube BID  . gabapentin  300 mg Per Tube QHS  . guaiFENesin-dextromethorphan  5 mL Per Tube QID  . heparin injection (subcutaneous)  5,000 Units Subcutaneous Q12H  . ketotifen  1 drop Both Eyes BID  . lidocaine  1 patch Transdermal Q24H  . metroNIDAZOLE  500 mg Per Tube Q8H  . multivitamin  15 mL Per Tube Daily  . pantoprazole sodium  40 mg Per Tube Daily  . polyethylene glycol  17 g Per Tube Daily   Continuous Infusions: . sodium chloride 75 mL/hr at 04/25/20 1622  . cefTRIAXone (ROCEPHIN)  IV 2 g (04/25/20 1728)   PRN Meds: acetaminophen **OR** acetaminophen, bisacodyl, diclofenac Sodium, guaiFENesin-dextromethorphan, HYDROcodone-acetaminophen, ipratropium-albuterol, ondansetron (ZOFRAN) IV  Time spent: 35 minutes  Author: Berle Mull, MD [facility] 04/25/2020 8:05 PM  To reach On-call, see care teams to locate the attending and reach out via [email or web address]. Between 7PM-7AM, please contact night-coverage If you still have difficulty reaching the attending provider, please page the [facility] (Director on Call) for [facility] on amion for assistance.

## 2020-04-25 NOTE — Progress Notes (Signed)
Patient writes the plan for her here is antibiotics, chemo and labs. Pt unsure of chemo plan at this point but would like to know.

## 2020-04-25 NOTE — Progress Notes (Deleted)
HEMATOLOGY-ONCOLOGY PROGRESS NOTE  SUBJECTIVE:   The patient is feeling tired this morning. She complains of abdominal pain, more so in the lower quadrants and she is not able to have a good bowel movements. She complained of some pain in the mouth, but able to eat and drink. No change in breathing.  Rest of the pertinent 10 point ROS reviewed and negative  I have reviewed the past medical history, past surgical history, social history and family history with the patient and they are unchanged from previous note.   PHYSICAL EXAMINATION: ECOG PERFORMANCE STATUS: 2 - Symptomatic, <50% confined to bed  There were no vitals filed for this visit. There were no vitals filed for this visit.  Intake/Output from previous day: No intake/output data recorded.  GENERAL: alert, no distress and comfortable, on trach collar Neck: Significant decrease in the LN  Ant lung fields clear. Trach site with clear mucus Abdomen. PEG site clean ,tenderness noted in the lower abdomen. BS normal. She was however holding her abdomen when she was coughing. BLE venous stasis , no change.  LABORATORY DATA:  I have reviewed the data as listed CMP Latest Ref Rng & Units 04/25/2020 04/24/2020 04/16/2020  Glucose 70 - 99 mg/dL 88 108(H) 92  BUN 6 - 20 mg/dL 24(H) 25(H) 17  Creatinine 0.44 - 1.00 mg/dL 1.75(H) 1.79(H) 1.09(H)  Sodium 135 - 145 mmol/L 135 134(L) 135  Potassium 3.5 - 5.1 mmol/L 4.6 5.0 4.7  Chloride 98 - 111 mmol/L 99 98 98  CO2 22 - 32 mmol/L 28 29 29   Calcium 8.9 - 10.3 mg/dL 8.3(L) 8.4(L) 8.3(L)  Total Protein 6.5 - 8.1 g/dL 5.7(L) 6.3(L) 6.1(L)  Total Bilirubin 0.3 - 1.2 mg/dL 0.8 0.9 0.4  Alkaline Phos 38 - 126 U/L 38 42 45  AST 15 - 41 U/L 12(L) 13(L) 13(L)  ALT 0 - 44 U/L 10 11 9     Lab Results  Component Value Date   WBC 4.8 04/25/2020   HGB 9.5 (L) 04/25/2020   HCT 29.8 (L) 04/25/2020   MCV 99.3 04/25/2020   PLT 137 (L) 04/25/2020   NEUTROABS 3.5 04/25/2020    CT ABDOMEN  PELVIS WO CONTRAST  Result Date: 04/09/2020 CLINICAL DATA:  Intermittent lower quadrant abdominal pain, diminished bowel movements over past 3 days, fever, history COPD, hypertension, hiatal hernia EXAM: CT ABDOMEN AND PELVIS WITHOUT CONTRAST TECHNIQUE: Multidetector CT imaging of the abdomen and pelvis was performed following the standard protocol without IV contrast. Sagittal and coronal MPR images reconstructed from axial data set. COMPARISON:  03/15/2020 FINDINGS: Lower chest: Bibasilar consolidation, volume loss and pleural effusions Hepatobiliary: Gallbladder surgically absent. Liver normal appearance Pancreas: Atrophic, otherwise unremarkable Spleen: Normal appearance Adrenals/Urinary Tract: LEFT renal cysts unchanged. Adrenal glands, kidneys, ureters, and bladder otherwise normal appearance Stomach/Bowel: Scattered radiopacities within stool. Normal appendix. Gastrostomy tube in stomach. Mild diverticulosis of distal descending and proximal sigmoid colon without evidence of diverticulitis. Significant rectal wall thickening with surrounding perirectal infiltrative changes extending into presacral space favor proctitis though tumor not excluded. Prior ventral hernia repair with recurrent hernia identified at the inferior margin of the repair containing a nonobstructed small bowel loop (see coronal images 81-82). Vascular/Lymphatic: Atherosclerotic calcifications aorta and iliac arteries without aneurysm. No adenopathy. Reproductive: Unremarkable uterus and adnexa Other: Infiltration of fat within small bowel mesentery consistent with fibrosing mesenteritis. No free air or free fluid. Musculoskeletal: Osseous demineralization. IMPRESSION: Distal colonic diverticulosis without evidence of diverticulitis. Significant rectal wall thickening with surrounding perirectal infiltrative changes  extending into presacral space favor proctitis though tumor not excluded; recommend correlation with proctoscopy. Prior  ventral hernia repair with recurrent hernia at the inferior margin of the repair, containing a nonobstructed small bowel loop. Changes of fibrosing mesenteritis. Bibasilar consolidation, volume loss and pleural effusions. Aortic Atherosclerosis (ICD10-I70.0). Electronically Signed   By: Lavonia Dana M.D.   On: 04/09/2020 13:27   CT CHEST WO CONTRAST  Result Date: 04/13/2020 CLINICAL DATA:  Pleural effusion EXAM: CT CHEST WITHOUT CONTRAST TECHNIQUE: Multidetector CT imaging of the chest was performed following the standard protocol without IV contrast. COMPARISON:  03/15/2020 FINDINGS: Cardiovascular: Extensive coronary artery calcification again noted predominantly within the left anterior descending coronary artery. Global cardiac size within normal limits. No pericardial effusion. Central pulmonary arteries are enlarged in keeping with changes of pulmonary arterial hypertension, similar to that noted on prior examination. Mild atherosclerotic calcification is seen within the thoracic aorta. Aberrant right subclavian artery noted. Mediastinum/Nodes: Tracheostomy in expected position. No pathologic thoracic adenopathy. The esophagus is unremarkable. Lungs/Pleura: Patchy pulmonary infiltrate is seen within the a left upper lobe, new since prior examination and likely infectious in the acute setting. Bibasilar atelectasis is present with subtotal collapse of the lower lobes bilaterally. Small bilateral pleural effusions are present, left greater than right. No pneumothorax. No central obstructing lesion. Upper Abdomen: Cholecystectomy has been performed. No acute abnormality. Musculoskeletal: No lytic or blastic bone lesions are seen. Multiple remote left rib fractures are noted no acute bone abnormality. IMPRESSION: Small bilateral pleural effusions with associated bibasilar atelectasis. Patchy airspace infiltrate within the left upper lobe, likely infectious in the acute setting. No central obstructing lesion.  Extensive coronary artery calcification. Morphologic changes in keeping with pulmonary arterial hypertension. Aortic Atherosclerosis (ICD10-I70.0). Electronically Signed   By: Fidela Salisbury MD   On: 04/13/2020 20:19   CT ABDOMEN PELVIS W CONTRAST  Result Date: 04/24/2020 CLINICAL DATA:  Abdominal pain, fever, for surge EXAM: CT ABDOMEN AND PELVIS WITH CONTRAST TECHNIQUE: Multidetector CT imaging of the abdomen and pelvis was performed using the standard protocol following bolus administration of intravenous contrast. CONTRAST:  48mL OMNIPAQUE IOHEXOL 300 MG/ML  SOLN COMPARISON:  04/09/2020 FINDINGS: Lower chest: Bandlike scarring and atelectasis of the dependent bilateral lung bases. Multiple nonacute fractures of the posterior left ribs. Hepatobiliary: No focal liver abnormality is seen. Status post cholecystectomy. No biliary dilatation. Pancreas: Unremarkable. No pancreatic ductal dilatation or surrounding inflammatory changes. Spleen: Normal in size without significant abnormality. Adrenals/Urinary Tract: Adrenal glands are unremarkable. Kidneys are normal, without renal calculi, solid lesion, or hydronephrosis. Bladder is unremarkable. Stomach/Bowel: Percutaneous gastrostomy, balloon within the gastric lumen. Appendix appears normal. No evidence of bowel wall thickening, distention, or inflammatory changes. Vascular/Lymphatic: Aortic atherosclerosis. No enlarged abdominal or pelvic lymph nodes. Reproductive: No mass or other significant abnormality. Other: Broad-based midline ventral abdominal hernia, status post mesh repair. Redemonstrated recurrent inferior hernia component, containing a single, nonobstructed loop of mid small bowel (series 2, image 62). No abdominopelvic ascites. Musculoskeletal: No acute or significant osseous findings. IMPRESSION: 1. No acute CT findings of the abdomen or pelvis to explain abdominal pain. No evidence infectious nidus internal to the abdomen or pelvis. 2. Previously  identified perirectal fat stranding is resolved, consistent with resolution of infectious or inflammatory proctitis. 3. Broad-based midline ventral abdominal hernia, status post mesh repair. Redemonstrated recurrent inferior hernia component, containing a single, nonobstructed loop of mid small bowel. Aortic Atherosclerosis (ICD10-I70.0). Electronically Signed   By: Eddie Candle M.D.   On: 04/24/2020 17:05  DG Chest Port 1 View  Result Date: 04/24/2020 CLINICAL DATA:  Questionable sepsis EXAM: PORTABLE CHEST 1 VIEW COMPARISON:  04/11/2020, CT chest, 04/13/2020 FINDINGS: Cardiomegaly. Tracheostomy. Bandlike scarring or atelectasis of the right midlung. There is a background of diffuse, coarse appearing interstitial opacity. No acute appearing airspace opacity. IMPRESSION: 1.  Cardiomegaly. 2.  Tracheostomy. 3. Bandlike scarring or atelectasis of the right midlung. There is a background of diffuse, coarse appearing interstitial opacity, generally corresponding to scarring and atelectasis seen on prior CT. No acute appearing airspace opacity. Electronically Signed   By: Eddie Candle M.D.   On: 04/24/2020 15:23   DG CHEST PORT 1 VIEW  Result Date: 04/11/2020 CLINICAL DATA:  Hypoxia. EXAM: PORTABLE CHEST 1 VIEW COMPARISON:  April 08, 2020. FINDINGS: Stable cardiomediastinal silhouette. Tracheostomy tube is unchanged. No pneumothorax is noted. Stable left pleural effusion is noted with associated left basilar atelectasis or infiltrate. Right midlung subsegmental atelectasis is noted with probable small right pleural effusion. Bony thorax is unremarkable. IMPRESSION: Stable left pleural effusion with associated left basilar atelectasis or infiltrate. Right midlung subsegmental atelectasis is noted with probable small right pleural effusion. Electronically Signed   By: Marijo Conception M.D.   On: 04/11/2020 13:20   DG CHEST PORT 1 VIEW  Result Date: 04/08/2020 CLINICAL DATA:  Respiratory failure. EXAM:  PORTABLE CHEST 1 VIEW COMPARISON:  April 03, 2020 FINDINGS: There is stable tracheostomy tube positioning. Mild, diffuse chronic appearing increased interstitial lung markings are seen. Mild to moderate severity atelectasis and/or infiltrate is seen within the right lung base. This is similar in severity when compared to the prior study. Marked severity left basilar atelectasis and/or infiltrate is seen and is increased in severity when compared to the prior exam. There is no evidence of a pleural effusion or pneumothorax. There is mild to moderate severity enlargement of the cardiac silhouette. Degenerative changes are seen throughout the thoracic spine. IMPRESSION: 1. Bibasilar atelectasis and/or infiltrate, left greater than right. Electronically Signed   By: Virgina Norfolk M.D.   On: 04/08/2020 16:45   DG CHEST PORT 1 VIEW  Result Date: 04/03/2020 CLINICAL DATA:  Increasing shortness of breath EXAM: PORTABLE CHEST 1 VIEW COMPARISON:  03/31/2020 FINDINGS: Tracheostomy tube is noted in satisfactory position. Cardiac shadow is enlarged but stable. Some persistent bibasilar opacities are noted although mildly improved when compared with the prior exam. No new focal abnormality is noted. IMPRESSION: Improved aeration as described. Electronically Signed   By: Inez Catalina M.D.   On: 04/03/2020 11:59   DG CHEST PORT 1 VIEW  Result Date: 03/31/2020 CLINICAL DATA:  Pleural effusion EXAM: PORTABLE CHEST 1 VIEW COMPARISON:  03/29/2020 FINDINGS: Tracheostomy unchanged. Lung volumes are small, however, pulmonary insufflation remains stable since prior examination. Bilateral pleural effusions are present small and decreased in size since prior examination. Ovoid opacity within the right mid lung zone likely represents fluid within the fissure. There is bibasilar atelectasis. Cardiac size is within normal limits. No acute bone abnormality. IMPRESSION: Slight interval decrease in small bilateral pleural effusions  with associated bibasilar atelectasis. Pulmonary hypoinflation. Electronically Signed   By: Fidela Salisbury MD   On: 03/31/2020 05:41   DG CHEST PORT 1 VIEW  Result Date: 03/29/2020 CLINICAL DATA:  Dyspnea EXAM: PORTABLE CHEST 1 VIEW COMPARISON:  03/25/2020 FINDINGS: Tracheostomy unchanged. Pulmonary insufflation is symmetric though has decreased slightly since prior examination. Small bilateral pleural effusions have developed with associated bibasilar atelectasis. No pneumothorax. Cardiac size within normal limits. Pulmonary vascularity is  normal. No acute bone abnormality. IMPRESSION: Interval development of small bilateral pleural effusions with bibasilar compressive atelectasis. Electronically Signed   By: Fidela Salisbury MD   On: 03/29/2020 06:00   DG Abd Portable 1V  Result Date: 04/13/2020 CLINICAL DATA:  Abdominal pain EXAM: PORTABLE ABDOMEN - 1 VIEW COMPARISON:  CT abdomen and pelvis April 09, 2020; abdominal radiograph April 07, 2020 FINDINGS: Gastrostomy catheter overlying stomach region. Moderate stool in colon. No bowel dilatation or air-fluid level to suggest bowel obstruction. No free air. Postoperative change left pelvic region. Loculated effusion left base. IMPRESSION: No bowel obstruction or free air. Moderate stool in colon. Loculated pleural effusion lateral left base. Electronically Signed   By: Lowella Grip III M.D.   On: 04/13/2020 16:14   DG Abd Portable 1V  Result Date: 04/07/2020 CLINICAL DATA:  Abdomen pain EXAM: PORTABLE ABDOMEN - 1 VIEW COMPARISON:  CT 03/15/2020 FINDINGS: Gastrostomy tube in the left mid abdomen. Nonobstructed bowel-gas pattern. Evidence of prior hernia repair. Curvilinear densities in the right lower quadrant, possible calcifications or retained contrast within appendix. IMPRESSION: Nonobstructed gas pattern. Electronically Signed   By: Donavan Foil M.D.   On: 04/07/2020 21:16   DG Swallowing Func-Speech Pathology  Result Date:  03/27/2020 Objective Swallowing Evaluation: Type of Study: MBS-Modified Barium Swallow Study  Patient Details Name: TANISA LAGACE MRN: [id number] Date of Birth: 10/23/1960 Today's Date: 03/27/2020 Time: SLP Start Time (ACUTE ONLY): 8185 -SLP Stop Time (ACUTE ONLY): 1235 SLP Time Calculation (min) (ACUTE ONLY): 20 min Past Medical History: Past Medical History: Diagnosis Date . Bronchitis  . Class 3 obesity 01/23/2020 . COPD (chronic obstructive pulmonary disease) ([place])  . Degenerative disc disease, lumbar  . Hiatal hernia  . Hypertension  Past Surgical History: Past Surgical History: Procedure Laterality Date . CHOLECYSTECTOMY   . degenerative bone disease   . DIRECT LARYNGOSCOPY N/A 03/13/2020  Procedure: DIRECT LARYNGOSCOPY WITH BIOPSY;  Surgeon: Jason Coop, DO;  Location: [facility];  Service: ENT;  Laterality: N/A; . INCISIONAL HERNIA REPAIR N/A 01/15/2019  Procedure: [name] HERNIORRHAPHY WITH MESH;  Surgeon: [name], MD;  Location: [facility];  Service: General;  Laterality: N/A; . IR GASTROSTOMY TUBE MOD SED  03/22/2020 . OMENTECTOMY N/A 01/15/2019  Procedure: OMENTECTOMY;  Surgeon: [name], MD;  Location: [facility];  Service: General;  Laterality: N/A; . TRACHEOSTOMY TUBE PLACEMENT N/A 03/13/2020  Procedure: AWAKE TRACHEOSTOMY;  Surgeon: Jason Coop, DO;  Location: [facility];  Service: ENT;  Laterality: N/A; HPI: Veronica Horton is a 59 y/o F with history of COPD,chronic respiratory failure on home oxygen at 5 L/min, with large laryngeal mass lesion, now s/p tracheostomy with Shiley 6-0 and direct laryngoscopy and biopsy. 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  Pt is also s/p PEG for nutrition.  Plans are for chemo and radiation concurrently starting while in hospital.  Prior medical history includes smoking and some dyspnea if walking long distanced.  Subjective: pt awake in chair Assessment / Plan / Recommendation CHL IP CLINICAL IMPRESSIONS 03/27/2020 Clinical  Impression Pt continues with with mild pharyngeal phase dysphagia - obstructive due to mass impacting epiglottic deflection.  In addition, she requires extra time to masticate solids due to dentition.  Swallow trigger is timely with minimal laryngeal penetration of liquid into larynx during the swallow due to decreased epiglottic deflection.  Min vallecular residue with solids without pt awareness - liquid wash assisted to clear.  Pt with prominent cricopharyngeus and appears with trace backflow near this region  but view was suboptimal.  Recommend D3/mech soft and thin liquids, po medications whole in puree and follow with liquid wash.  Of note, following testing, pt did cough and slight whitish coating noted on secretions but no aspiration noted during testing.  Suspect pt has some low grade chronic secretion aspiration. SLP Visit Diagnosis Dysphagia, oropharyngeal phase (R13.12) Attention and concentration deficit following -- Frontal lobe and executive function deficit following -- Impact on safety and function Mild aspiration risk   CHL IP TREATMENT RECOMMENDATION 03/27/2020 Treatment Recommendations Therapy as outlined in treatment plan below   Prognosis 03/27/2020 Prognosis for Safe Diet Advancement Fair Barriers to Reach Goals Other (Comment) Barriers/Prognosis Comment -- CHL IP DIET RECOMMENDATION 03/27/2020 SLP Diet Recommendations Dysphagia 3 (Mech soft) solids;Thin liquid Liquid Administration via -- Medication Administration Whole meds with puree Compensations Slow rate;Small sips/bites Postural Changes --   CHL IP OTHER RECOMMENDATIONS 03/27/2020 Recommended Consults (No Data) Oral Care Recommendations Oral care BID Other Recommendations Clarify dietary restrictions   CHL IP FOLLOW UP RECOMMENDATIONS 03/27/2020 Follow up Recommendations Skilled Nursing facility   [facility] IP FREQUENCY AND DURATION 03/27/2020 Speech Therapy Frequency (ACUTE ONLY) min 1 x/week Treatment Duration 1 week      CHL IP ORAL PHASE  03/27/2020 Oral Phase WFL Oral - Pudding Teaspoon -- Oral - Pudding Cup -- Oral - Honey Teaspoon -- Oral - Honey Cup -- Oral - Nectar Teaspoon -- Oral - Nectar Cup -- Oral - Nectar Straw -- Oral - Thin Teaspoon -- Oral - Thin Cup -- Oral - Thin Straw -- Oral - Puree -- Oral - Mech Soft -- Oral - Regular -- Oral - Multi-Consistency -- Oral - Pill -- Oral Phase - Comment --  CHL IP PHARYNGEAL PHASE 03/27/2020 Pharyngeal Phase -- Pharyngeal- Pudding Teaspoon -- Pharyngeal -- Pharyngeal- Pudding Cup -- Pharyngeal -- Pharyngeal- Honey Teaspoon -- Pharyngeal -- Pharyngeal- Honey Cup -- Pharyngeal -- Pharyngeal- Nectar Teaspoon -- Pharyngeal -- Pharyngeal- Nectar Cup -- Pharyngeal -- Pharyngeal- Nectar Straw Reduced epiglottic inversion;Pharyngeal residue - valleculae Pharyngeal -- Pharyngeal- Thin Teaspoon Reduced epiglottic inversion;Pharyngeal residue - valleculae Pharyngeal -- Pharyngeal- Thin Cup Pharyngeal residue - valleculae;Reduced epiglottic inversion;Penetration/Aspiration during swallow Pharyngeal Material enters airway, remains ABOVE vocal cords then ejected out Pharyngeal- Thin Straw Reduced epiglottic inversion;Pharyngeal residue - valleculae;Penetration/Aspiration during swallow Pharyngeal Material enters airway, remains ABOVE vocal cords then ejected out Pharyngeal- Puree WFL Pharyngeal -- Pharyngeal- Mechanical Soft -- Pharyngeal -- Pharyngeal- Regular Reduced epiglottic inversion Pharyngeal -- Pharyngeal- Multi-consistency -- Pharyngeal -- Pharyngeal- Pill NT Pharyngeal -- Pharyngeal Comment --  CHL IP CERVICAL ESOPHAGEAL PHASE 03/27/2020 Cervical Esophageal Phase -- Pudding Teaspoon -- Pudding Cup -- Honey Teaspoon -- Honey Cup -- Nectar Teaspoon -- Nectar Cup -- Nectar Straw -- Thin Teaspoon -- Thin Cup Prominent cricopharyngeal segment Thin Straw -- Puree -- Mechanical Soft -- Regular -- Multi-consistency -- Pill -- Cervical Esophageal Comment appearance of potential minimal backflow of liquid near  UES region = suboptimal view - did not backflow into pharynx/larynx [name], MS [facility] SLP Acute Rehab Services Office [phone] Pager [phone] [name] 03/27/2020, 2:31 PM              VAS [place] UPPER EXTREMITY VENOUS DUPLEX  Result Date: 04/16/2020 UPPER VENOUS STUDY  Indications: Swelling, and Pain Comparison Study: no prior Performing Technologist: Abram Sander RVS  Examination Guidelines: A complete evaluation includes B-mode imaging, spectral Doppler, color Doppler, and power Doppler as needed of all accessible portions of each vessel. Bilateral testing is considered an  integral part of a complete examination. Limited examinations for reoccurring indications may be performed as noted.  Right Findings: +----------+------------+---------+-----------+----------+-----------------+ RIGHT     CompressiblePhasicitySpontaneousProperties     Summary      +----------+------------+---------+-----------+----------+-----------------+ IJV           Full       Yes       Yes                                +----------+------------+---------+-----------+----------+-----------------+ Subclavian    Full       Yes       Yes                                +----------+------------+---------+-----------+----------+-----------------+ Axillary      Full       Yes       Yes                                +----------+------------+---------+-----------+----------+-----------------+ Brachial      Full       Yes       Yes                                +----------+------------+---------+-----------+----------+-----------------+ Radial        Full                                                    +----------+------------+---------+-----------+----------+-----------------+ Ulnar         Full                                                    +----------+------------+---------+-----------+----------+-----------------+ Cephalic      None                                   Age Indeterminate +----------+------------+---------+-----------+----------+-----------------+ Basilic       None                                  Age Indeterminate +----------+------------+---------+-----------+----------+-----------------+  Summary:  Right: No evidence of deep vein thrombosis in the upper extremity. Findings consistent with age indeterminate superficial vein thrombosis involving the right cephalic vein and right basilic vein.  *See table(s) above for measurements and observations.  Diagnosing physician: Monica Martinez MD Electronically signed by Monica Martinez MD on 04/16/2020 at 4:45:55 PM.    Final    ASSESSMENT AND PLAN:  59 year old Caucasian female, with medical history of heavy smoking, COPD on home oxygen 5 L/min, hypertension, presents with worsening dyspnea and altered mental status.  Work-up showed a large laryngeal mass.  Patient required urgent tracheostomy.  1.  Supraglottic laryngeal cancer, [id number] 2. COPD with acute exacerbation, on chronic home oxygen 5 L/min 3. HTN  4.  Heavy smoking history  Recommendations:  -She has T4aN2c supraglottic laryngeal  cancer, with compromised airway, required urgent tracheostomy.  -Started concurrent CRT on 03/29/2020.  Tolerating chemotherapy well. Significant response in lymphadenopathy. Labs reviewed, no major cytopenias, BUN and creatinine high, likely some AKI. We will closely monitor this. New onset abdominal pain, agree with trying laxative and CT imaging to evaluate further. Upon discharge, we will arrange for out patient follow up. Discussed plan of care with Dr Lonny Prude.

## 2020-04-26 ENCOUNTER — Inpatient Hospital Stay: Payer: Medicaid Other

## 2020-04-26 ENCOUNTER — Inpatient Hospital Stay (HOSPITAL_COMMUNITY): Payer: Medicaid Other

## 2020-04-26 ENCOUNTER — Inpatient Hospital Stay: Payer: Medicaid Other | Admitting: Hematology and Oncology

## 2020-04-26 ENCOUNTER — Ambulatory Visit
Admit: 2020-04-26 | Discharge: 2020-04-26 | Disposition: A | Discharge status: Home / Self Care | Payer: Medicaid Other | Attending: Radiation Oncology | Admitting: Radiation Oncology

## 2020-04-26 DIAGNOSIS — R609 Edema, unspecified: Secondary | ICD-10-CM

## 2020-04-26 DIAGNOSIS — F17210 Nicotine dependence, cigarettes, uncomplicated: Secondary | ICD-10-CM

## 2020-04-26 DIAGNOSIS — J449 Chronic obstructive pulmonary disease, unspecified: Secondary | ICD-10-CM

## 2020-04-26 DIAGNOSIS — C321 Malignant neoplasm of supraglottis: Secondary | ICD-10-CM

## 2020-04-26 LAB — COMPREHENSIVE METABOLIC PANEL
ALT: 9 U/L (ref 0–44)
AST: 12 U/L — ABNORMAL LOW (ref 15–41)
Albumin: 2.5 g/dL — ABNORMAL LOW (ref 3.5–5.0)
Alkaline Phosphatase: 37 U/L — ABNORMAL LOW (ref 38–126)
Anion gap: 7 (ref 5–15)
BUN: 22 mg/dL — ABNORMAL HIGH (ref 6–20)
CO2: 28 mmol/L (ref 22–32)
Calcium: 8 mg/dL — ABNORMAL LOW (ref 8.9–10.3)
Chloride: 101 mmol/L (ref 98–111)
Creatinine, Ser: 1.56 mg/dL — ABNORMAL HIGH (ref 0.44–1.00)
GFR, Estimated: 38 mL/min — ABNORMAL LOW (ref >60)
Glucose, Bld: 93 mg/dL (ref 70–99)
Potassium: 4.5 mmol/L (ref 3.5–5.1)
Sodium: 136 mmol/L (ref 135–145)
Total Bilirubin: 0.6 mg/dL (ref 0.3–1.2)
Total Protein: 5.3 g/dL — ABNORMAL LOW (ref 6.5–8.1)

## 2020-04-26 LAB — CBC WITH DIFFERENTIAL/PLATELET
Abs Immature Granulocytes: 0.07 10*3/uL (ref 0.00–0.07)
Basophils Absolute: 0 10*3/uL (ref 0.0–0.1)
Basophils Relative: 1 %
Eosinophils Absolute: 0 10*3/uL (ref 0.0–0.5)
Eosinophils Relative: 1 %
HCT: 29.2 % — ABNORMAL LOW (ref 36.0–46.0)
Hemoglobin: 9.2 g/dL — ABNORMAL LOW (ref 12.0–15.0)
Immature Granulocytes: 2 %
Lymphocytes Relative: 12 %
Lymphs Abs: 0.5 10*3/uL — ABNORMAL LOW (ref 0.7–4.0)
MCH: 31.5 pg (ref 26.0–34.0)
MCHC: 31.5 g/dL (ref 30.0–36.0)
MCV: 100 fL (ref 80.0–100.0)
Monocytes Absolute: 0.7 10*3/uL (ref 0.1–1.0)
Monocytes Relative: 16 %
Neutro Abs: 3 10*3/uL (ref 1.7–7.7)
Neutrophils Relative %: 68 %
Platelets: 127 10*3/uL — ABNORMAL LOW (ref 150–400)
RBC: 2.92 MIL/uL — ABNORMAL LOW (ref 3.87–5.11)
RDW: 15.5 % (ref 11.5–15.5)
WBC: 4.4 10*3/uL (ref 4.0–10.5)
nRBC: 0 % (ref 0.0–0.2)

## 2020-04-26 LAB — GLUCOSE, CAPILLARY
Glucose-Capillary: 103 mg/dL — ABNORMAL HIGH (ref 70–99)
Glucose-Capillary: 108 mg/dL — ABNORMAL HIGH (ref 70–99)
Glucose-Capillary: 135 mg/dL — ABNORMAL HIGH (ref 70–99)
Glucose-Capillary: 158 mg/dL — ABNORMAL HIGH (ref 70–99)
Glucose-Capillary: 163 mg/dL — ABNORMAL HIGH (ref 70–99)
Glucose-Capillary: 98 mg/dL (ref 70–99)

## 2020-04-26 LAB — MAGNESIUM: Magnesium: 1.7 mg/dL (ref 1.7–2.4)

## 2020-04-26 LAB — LACTIC ACID, PLASMA: Lactic Acid, Venous: 0.9 mmol/L (ref 0.5–1.9)

## 2020-04-26 LAB — PROCALCITONIN: Procalcitonin: <0.1 ng/mL

## 2020-04-26 MED ORDER — MIDODRINE HCL 5 MG PO TABS
5.0000 mg | ORAL_TABLET | Freq: Two times a day (BID) | ORAL | Status: DC
  Administered 2020-04-26 – 2020-04-30 (×8): 5 mg via ORAL
  Filled 2020-04-26 (×9): qty 1

## 2020-04-26 MED ORDER — HEPARIN (PORCINE) 25000 UT/250ML-% IV SOLN
1750.0000 [IU]/h | INTRAVENOUS | Status: DC
  Administered 2020-04-26: 18:00:00 1500 [IU]/h via INTRAVENOUS
  Administered 2020-04-27: 08:00:00 1750 [IU]/h via INTRAVENOUS
  Filled 2020-04-26 (×2): qty 250

## 2020-04-26 MED ORDER — SACCHAROMYCES BOULARDII 250 MG PO CAPS
250.0000 mg | ORAL_CAPSULE | Freq: Two times a day (BID) | ORAL | Status: DC
  Administered 2020-04-27 – 2020-04-30 (×8): 250 mg
  Filled 2020-04-26 (×8): qty 1

## 2020-04-26 MED ORDER — BACITRACIN-NEOMYCIN-POLYMYXIN 400-5-5000 EX OINT
TOPICAL_OINTMENT | Freq: Two times a day (BID) | CUTANEOUS | Status: DC
  Filled 2020-04-26: qty 1

## 2020-04-26 MED ORDER — HEPARIN BOLUS VIA INFUSION
2500.0000 [IU] | Freq: Once | INTRAVENOUS | Status: AC
  Administered 2020-04-26: 18:00:00 2500 [IU] via INTRAVENOUS
  Filled 2020-04-26: qty 2500

## 2020-04-26 MED ORDER — ENOXAPARIN SODIUM 40 MG/0.4ML ~~LOC~~ SOLN: 40.0000 mg | SUBCUTANEOUS | Status: DC

## 2020-04-26 MED ORDER — IPRATROPIUM-ALBUTEROL 0.5-2.5 (3) MG/3ML IN SOLN
3.0000 mL | Freq: Four times a day (QID) | RESPIRATORY_TRACT | Status: DC
  Administered 2020-04-26 – 2020-04-28 (×7): 3 mL via RESPIRATORY_TRACT
  Filled 2020-04-26 (×7): qty 3

## 2020-04-26 NOTE — Progress Notes (Signed)
Bilateral lower extremity venous duplex has been completed. Preliminary results can be found in CV Proc through chart review.  Results were given to the patient's nurse, Myriam Jacobson.  04/26/20 3:29 PM Veronica Horton RVT

## 2020-04-26 NOTE — Progress Notes (Signed)
ANTICOAGULATION CONSULT NOTE - Initial Consult  Pharmacy Consult for Heparin Indication: DVT  Allergies  Allergen Reactions  . Codeine Hives    Reports itching only per RN  . Penicillins Hives    Did it involve swelling of the face/tongue/throat, SOB, or low BP? No Did it involve sudden or severe rash/hives, skin peeling, or any reaction on the inside of your mouth or nose? Yes Did you need to seek medical attention at a hospital or doctor's office? Unknown When did it last happen?Over 10 years If all above answers are "NO", may proceed with cephalosporin use.     Patient Measurements: Height: 5\' 5"  (165.1 cm) Weight: 115.9 kg (255 lb 8 oz) IBW/kg (Calculated) : 57 Heparin Dosing Weight: 84.6 kg  Vital Signs: Temp: 98.2 F (36.8 C) (12/27 1354) Temp Source: Oral (12/27 0500) BP: 126/66 (12/27 1354) Pulse Rate: 56 (12/27 1354)  Labs: Recent Labs    04/24/20 1534 04/25/20 0523 04/25/20 0810 04/26/20 0426  HGB 10.2* 9.0* 9.5* 9.2*  HCT 32.9* 27.9* 29.8* 29.2*  PLT 187 141* 137* 127*  APTT 29  --   --   --   LABPROT 14.3  --   --   --   INR 1.2  --   --   --   CREATININE 1.79* 1.62* 1.75* 1.56*    Estimated Creatinine Clearance: 48.8 mL/min (A) (by C-G formula based on SCr of 1.56 mg/dL (H)).   Medical History: Past Medical History:  Diagnosis Date  . Bronchitis   . Class 3 obesity 01/23/2020  . COPD (chronic obstructive pulmonary disease) ([place])   . Degenerative disc disease, lumbar   . Hiatal hernia   . Hypertension     Medications:  Scheduled:  . arformoterol  15 mcg Nebulization BID  . budesonide  0.5 mg Nebulization BID  . feeding supplement (OSMOLITE 1.5 CAL)  237 mL Per Tube 6 X Daily  . feeding supplement (PROSource TF)  45 mL Per Tube BID  . gabapentin  300 mg Per Tube QHS  . guaiFENesin-dextromethorphan  5 mL Per Tube QID  . ipratropium-albuterol  3 mL Nebulization QID  . ketotifen  1 drop Both Eyes BID  . lidocaine  1 patch Transdermal  Q24H  . metroNIDAZOLE  500 mg Per Tube Q8H  . midodrine  5 mg Oral BID WC  . multivitamin  15 mL Per Tube Daily  . neomycin-bacitracin-polymyxin   Topical BID  . nystatin  5 mL Mouth/Throat QID  . pantoprazole sodium  40 mg Per Tube Daily   Infusions:  . cefTRIAXone (ROCEPHIN)  IV 2 g (04/25/20 1728)   PRN: acetaminophen **OR** acetaminophen, bisacodyl, diclofenac Sodium, guaiFENesin-dextromethorphan, HYDROcodone-acetaminophen, ondansetron (ZOFRAN) IV  Assessment: 59 yo female with invasive laryngeal carcinoma SP tracheostomy admitted with aspiration PNA.  Pharmacy is now consulted to dose IV heparin for acute DVT.  Goal of Therapy:  Heparin level 0.3-0.7 units/ml Monitor platelets by anticoagulation protocol: Yes   Plan:   Heparin 2500 units IV bolus  Heparin IV infusion 1500 units/hr  Check heparin level in 8hrs  Daily heparin level and CBC  Monitor for signs/symptoms of bleeding  Peggyann Juba, PharmD, BCPS Pharmacy: [phone] 04/26/2020,4:58 PM

## 2020-04-26 NOTE — TOC Initial Note (Signed)
Transition of Care [facility]) - Initial/Assessment Note    Patient Details  Name: Veronica Horton MRN: [id number] Date of Birth: 05-Sep-1960  Transition of Care [place]) CM/SW Contact:    Dessa Phi, RN Phone Number: 04/26/2020, 10:33 AM  Clinical Narrative: From home. Active-[facility] dme. D/c plan home                  Expected Discharge Plan: Home/Self Care Barriers to Discharge: Continued Medical Work up   Patient Goals and CMS Choice Patient states their goals for this hospitalization and ongoing recovery are:: go home CMS Medicare.gov Compare Post Acute Care list provided to:: Patient Choice offered to / list presented to : Patient  Expected Discharge Plan and Services Expected Discharge Plan: Home/Self Care   Discharge Planning Services: CM Consult Post Acute Care Choice: Durable Medical Equipment ([facility]-PEG/Trach supplies/oxygen) Living arrangements for the past 2 months: Single Family Home                                      Prior Living Arrangements/Services Living arrangements for the past 2 months: Single Family Home Lives with:: Siblings Patient language and need for interpreter reviewed:: Yes Do you feel safe going back to the place where you live?: Yes      Need for Family Participation in Patient Care: No (Comment) Care giver support system in place?: Yes (comment) Current home services: DME ([facility]) Criminal Activity/Legal Involvement Pertinent to Current Situation/Hospitalization: No - Comment as needed  Activities of Daily Living Home Assistive Devices/Equipment: Oxygen,Blood pressure cuff,CBG Meter,Eyeglasses ADL Screening (condition at time of admission) Patient's cognitive ability adequate to safely complete daily activities?: Yes Is the patient deaf or have difficulty hearing?: No Does the patient have difficulty seeing, even when wearing glasses/contacts?: No Does the patient have difficulty concentrating, remembering, or  making decisions?: No Patient able to express need for assistance with ADLs?: Yes Does the patient have difficulty dressing or bathing?: No Independently performs ADLs?: Yes (appropriate for developmental age) Does the patient have difficulty walking or climbing stairs?: Yes Weakness of Legs: Both Weakness of Arms/Hands: None  Permission Sought/Granted Permission sought to share information with : Case Manager Permission granted to share information with : Yes, Verbal Permission Granted  Share Information with NAME: Case Manager     Permission granted to share info w Relationship: donna sister [phone]     Emotional Assessment Appearance:: Appears stated age Attitude/Demeanor/Rapport: Gracious Affect (typically observed): Accepting Orientation: : Oriented to Self,Oriented to Place,Oriented to  Time,Oriented to Situation Alcohol / Substance Use: Not Applicable Psych Involvement: No (comment)  Admission diagnosis:  PNA (pneumonia) [J18.9] Sepsis ([place]) [A41.9] Sepsis, due to unspecified organism, unspecified whether acute organ dysfunction present [facility]) [A41.9] Patient Active Problem List   Diagnosis Date Noted  . PNA (pneumonia) 04/24/2020  . Sepsis ([place]) 04/24/2020  . Abnormal findings on diagnostic imaging of abdomen 04/12/2020  . 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 Tracheostomy in place [facility])   . Head and neck cancer ([place])   . Acute on chronic respiratory failure with hypoxia and hypercapnia ([place]) 03/13/2020  . Laryngeal mass 03/11/2020  . Acute respiratory failure with hypercapnia ([place]) 03/10/2020  . Acute exacerbation of chronic obstructive pulmonary disease (COPD) ([place]) 01/24/2020  . Acute respiratory failure with hypoxia and hypercapnia ([place]) 01/23/2020  . Class 3 obesity 01/23/2020  . Tobacco abuse 01/23/2020  . Pneumonia   .  Aspiration pneumonitis ([place]) 01/17/2019  . Incisional hernia, without obstruction or gangrene   . COPD with  acute exacerbation ([place]) 01/14/2019  . Small bowel obstruction ([place]) 01/13/2019  . COPD (chronic obstructive pulmonary disease) ([place])   . Hypertension   . Acute respiratory failure with hypoxia ([place])   . Polycythemia    PCP:  [facility]:   [place], [place] - [address] [place] [address] Phone: [phone] Fax: [phone]     Social Determinants of Health (SDOH) Interventions    Readmission Risk Interventions Readmission Risk Prevention Plan 04/26/2020 01/20/2019  Medication Screening - Complete  Transportation Screening Complete Complete  Medication Review ([place]) Complete -  PCP or Specialist appointment within 3-5 days of discharge Complete -  [place] or Home Care Consult Complete -  SW Recovery Care/Counseling Consult Complete -  [place] Patient Refused -  Some recent data might be hidden

## 2020-04-26 NOTE — Progress Notes (Signed)
Modified Barium Swallow Progress Note  Patient Details  Name: Veronica Horton MRN: [id number] Date of Birth: [birth year]  Today's Date: 04/26/2020  Modified Barium Swallow completed.  Full report located under Chart Review in the Imaging Section.  Brief recommendations include the following:  Clinical Impression  Pt continues with with mild pharyngeal phase dysphagia with minimal worsening of swallow function.  Dysphagia is obstructive in nature due to mass impacting epiglottic deflection and also imparing laryngeal closure, thus allowing liquid penetration abd trace aspiration and aspiration of secretions. Swallow trigger remains timely.  Min vallecular residue with solids and puree without pt awareness - liquid wash assisted to clear.   Recommend D3/mech soft and thin liquids, po medications consider crush with puree or suspension and follow with liquid wash.  After every 3-4 swallows of liquid, recommend pt produce hard cough and expectorate, as this helped to clear secretions.  Suspect pt has some low grade chronic secretion aspiration.  Trials of head of chair reclined, chin tuck ,etc were not helpful to prevent penetration or improve pharyngeal clearance.  Using teach back, advised pt to recommendations.   Swallow Evaluation Recommendations       SLP Diet Recommendations: Dysphagia 3 (Mech soft) solids;Thin liquid   Liquid Administration via: Cup;No straw   Medication Administration: Via alternative means (or crush with puree)   Supervision: Patient able to self feed   Compensations: Slow rate;Small sips/bites;Multiple dry swallows after each bite/sip;Hard cough after swallow (hard cough and re-swallow)   Postural Changes: Seated upright at 90 degrees   Oral Care Recommendations: Oral care BID   Other Recommendations: Clarify dietary restrictions;Have oral suction available  Veronica Lime, MS [facility] SLP Acute Rehab Services Office [phone] Pager [phone]    Veronica Horton 04/26/2020,10:01 AM

## 2020-04-26 NOTE — Progress Notes (Signed)
[facility] Progress Note  Patient: Veronica Horton    YQI:[id number]  DOA: 04/24/2020     Date of Service: the patient was seen and examined on 04/26/2020  Brief hospital course: Past medical history of recently diagnosed invasive laryngeal carcinoma SP tracheostomy, chronic respiratory failure on 3 LPM by trach collar, dysphagia, PEG tube placement, COPD, HTN, obesity. Presents with concerns for aspiration pneumonia. Currently plan is continue current care.  Assessment and Plan: 1.  Sepsis secondary to aspiration pneumonia, POA Acute on chronic hypoxic respiratory failure, POA Meeting SIRS criteria on admission with hypotension, fever, worsening hypoxia. Currently requiring 60% FiO2. Patient was given IV fluids. Currently started on ceftriaxone and Flagyl. Speech therapy consulted, MBS was completed patient was able to tolerate dysphagia 3 thin liquid diet.  Later when patient was given dysphagia 3 diet she started coughing violently and is requesting to remain on full liquid diet only. Follow-up on cultures. Supportive care for cough  2.  Acute kidney injury Renal function improving.  Monitor.   Currently appears to be volume overloaded although renal function still not back to baseline. Discontinue IV fluids and monitor. Will check urine electrolytes and creatinine to rule out a cardiorenal hemodynamics.  3.  COPD Continue inhalers.  4.  Laryngeal invasive carcinoma Outpatient follow-up with oncology. Monitor.  5.  Chronic venous insufficiency Acute DVT Currently stable. Has chronic swelling in the leg. Also reports chronic leg pain. Doppler last week showed SVT upper extremity. Doppler positive for acute DVT on right leg today. Initiating IV heparin. Monitor.  6.  Morbid obesity Patient is a patient at high risk for poor outcome. Currently actually has tracheostomy which will improve airway resistance Body mass index is 42.52 kg/m.  Nutrition Problem:  Inadequate oral intake Etiology: cancer and cancer related treatments,dysphagia Interventions: Interventions: MVI,Tube feeding      Diet: Full liquid diet DVT Prophylaxis:   Place and maintain sequential compression device Start: 04/26/20 1152 Place TED hose Start: 04/26/20 1152    Advance goals of care discussion: Limited code  Family Communication: no family was present at bedside, at the time of interview.   Disposition:  Status is: Inpatient  Remains inpatient appropriate because:IV treatments appropriate due to intensity of illness or inability to take PO and Inpatient level of care appropriate due to severity of illness  Dispo: The patient is from: Home              Anticipated d/c is to: Home              Anticipated d/c date is: > 3 days              Patient currently is not medically stable to d/c.  Subjective: Continues to have severe cough.  Also has shortness of breath.  No nausea no vomiting.  Leg swelling.  No bleeding.  Physical Exam: General: Appear in mild distress, no Rash; Oral Mucosa Clear, moist. no Abnormal Neck Mass Or lumps, Conjunctiva normal  Cardiovascular: S1 and S2 Present, no Murmur, Respiratory: increased respiratory effort, Bilateral Air entry present and bilateral  Crackles, bilateral wheezes Abdomen: Bowel Sound present, Soft and no tenderness Extremities: bilateral Pedal edema Neurology: alert and oriented to time, place, and person affect appropriate. no new focal deficit Gait not checked due to patient safety concerns    Vitals:   04/26/20 1040 04/26/20 1354 04/26/20 1547 04/26/20 1827  BP:  126/66    Pulse:  (!) 56    Resp:  18  Temp:  98.2 F (36.8 C)    TempSrc:      SpO2: 93% 93% 93% 94%  Weight:      Height:        Intake/Output Summary (Last 24 hours) at 04/26/2020 1915 Last data filed at 04/26/2020 1800 Gross per 24 hour  Intake 2270.25 ml  Output 400 ml  Net 1870.25 ml   Filed Weights   04/24/20 1448  Weight:  115.9 kg    Data Reviewed: I have personally reviewed and interpreted daily labs, tele strips, imagings as discussed above. I reviewed all nursing notes, pharmacy notes, vitals, pertinent old records I have discussed plan of care as described above with RN and patient/family.  CBC: Recent Labs  Lab 04/24/20 1534 04/25/20 0523 04/25/20 0810 04/26/20 0426  WBC 8.5 4.7 4.8 4.4  NEUTROABS 7.1  --  3.5 3.0  HGB 10.2* 9.0* 9.5* 9.2*  HCT 32.9* 27.9* 29.8* 29.2*  MCV 99.7 99.3 99.3 100.0  PLT 187 141* 137* 025*   Basic Metabolic Panel: Recent Labs  Lab 04/24/20 1534 04/25/20 0523 04/25/20 0810 04/26/20 0426  NA 134* 135 135 136  K 5.0 4.4 4.6 4.5  CL 98 99 99 101  CO2 29 27 28 28   GLUCOSE 108* 85 88 93  BUN 25* 23* 24* 22*  CREATININE 1.79* 1.62* 1.75* 1.56*  CALCIUM 8.4* 8.1* 8.3* 8.0*  MG  --   --  1.9 1.7    Studies: VAS [place] LOWER EXTREMITY VENOUS (DVT)  Result Date: 04/26/2020  Lower Venous DVT Study Indications: Edema.  Risk Factors: Cancer. Limitations: Body habitus and poor ultrasound/tissue interface. Comparison Study: No prior studies. Performing Technologist: Oliver Hum RVT  Examination Guidelines: A complete evaluation includes B-mode imaging, spectral Doppler, color Doppler, and power Doppler as needed of all accessible portions of each vessel. Bilateral testing is considered an integral part of a complete examination. Limited examinations for reoccurring indications may be performed as noted. The reflux portion of the exam is performed with the patient in reverse Trendelenburg.  +---------+---------------+---------+-----------+----------+-------------------+ RIGHT    CompressibilityPhasicitySpontaneityPropertiesThrombus Aging      +---------+---------------+---------+-----------+----------+-------------------+ CFV      Full           Yes      Yes                                       +---------+---------------+---------+-----------+----------+-------------------+ SFJ      Full                                                             +---------+---------------+---------+-----------+----------+-------------------+ FV Prox  Full                                                             +---------+---------------+---------+-----------+----------+-------------------+ FV Mid   Full                                                             +---------+---------------+---------+-----------+----------+-------------------+  FV Distal               Yes      Yes                                      +---------+---------------+---------+-----------+----------+-------------------+ PFV      Full                                                             +---------+---------------+---------+-----------+----------+-------------------+ POP      None           No       No                   Acute               +---------+---------------+---------+-----------+----------+-------------------+ PTV      Full                                                             +---------+---------------+---------+-----------+----------+-------------------+ PERO                                                  Not well visualized +---------+---------------+---------+-----------+----------+-------------------+   +---------+---------------+---------+-----------+----------+-------------------+ LEFT     CompressibilityPhasicitySpontaneityPropertiesThrombus Aging      +---------+---------------+---------+-----------+----------+-------------------+ CFV      Full           Yes      Yes                                      +---------+---------------+---------+-----------+----------+-------------------+ SFJ      Full                                                             +---------+---------------+---------+-----------+----------+-------------------+ FV  Prox  Full                                                             +---------+---------------+---------+-----------+----------+-------------------+ FV Mid   Full                                                             +---------+---------------+---------+-----------+----------+-------------------+ FV Distal  Yes      Yes                                      +---------+---------------+---------+-----------+----------+-------------------+ PFV      Full                                                             +---------+---------------+---------+-----------+----------+-------------------+ POP      Full           Yes      Yes                                      +---------+---------------+---------+-----------+----------+-------------------+ PTV      Full                                                             +---------+---------------+---------+-----------+----------+-------------------+ PERO                                                  Not well visualized +---------+---------------+---------+-----------+----------+-------------------+     Summary: RIGHT: - Findings consistent with acute deep vein thrombosis involving the right popliteal vein. - No cystic structure found in the popliteal fossa.  LEFT: - There is no evidence of deep vein thrombosis in the lower extremity. However, portions of this examination were limited- see technologist comments above.  - No cystic structure found in the popliteal fossa.  *See table(s) above for measurements and observations. Electronically signed by Ruta Hinds MD on 04/26/2020 at 5:22:14 PM.    Final     Scheduled Meds: . arformoterol  15 mcg Nebulization BID  . budesonide  0.5 mg Nebulization BID  . feeding supplement (OSMOLITE 1.5 CAL)  237 mL Per Tube 6 X Daily  . feeding supplement (PROSource TF)  45 mL Per Tube BID  . gabapentin  300 mg Per Tube QHS  . guaiFENesin-dextromethorphan  5 mL  Per Tube QID  . ipratropium-albuterol  3 mL Nebulization QID  . ketotifen  1 drop Both Eyes BID  . lidocaine  1 patch Transdermal Q24H  . metroNIDAZOLE  500 mg Per Tube Q8H  . midodrine  5 mg Oral BID WC  . multivitamin  15 mL Per Tube Daily  . neomycin-bacitracin-polymyxin   Topical BID  . nystatin  5 mL Mouth/Throat QID  . pantoprazole sodium  40 mg Per Tube Daily   Continuous Infusions: . cefTRIAXone (ROCEPHIN)  IV 2 g (04/26/20 1825)  . heparin 1,500 Units/hr (04/26/20 1817)   PRN Meds: acetaminophen **OR** acetaminophen, bisacodyl, diclofenac Sodium, guaiFENesin-dextromethorphan, HYDROcodone-acetaminophen, ondansetron (ZOFRAN) IV  Time spent: 35 minutes  Author: Berle Mull, MD [facility] 04/26/2020 7:15 PM  To reach On-call, see care teams to locate the attending and reach out via [email or web address]. Between 7PM-7AM, please contact night-coverage If  you still have difficulty reaching the attending provider, please page the [facility] (Director on Call) for [facility] on amion for assistance.

## 2020-04-26 NOTE — Consult Note (Signed)
[place] CONSULT NOTE  Patient Care Team: [facility] as PCP - General  CHIEF COMPLAINTS/PURPOSE OF CONSULTATION:  Fevers  ASSESSMENT & PLAN:  No problem-specific Assessment & Plan notes found for this encounter.  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  She was found to have a left upper lobe pneumonia and is currently on antibiotics.  Have seen her in the hospital, she was initially scheduled with me in the clinic prior to chemotherapy administration. She feels well except for ongoing cough, shortness of breath, had fevers at home before she came to the hospital. At this time given ongoing treatment for pneumonia, we cannot proceed with chemotherapy.  She has only received 3 cycles of chemotherapy so far.  We will continue to monitor her periodically while she is admitted in the hospital.  If she recovers, we will plan to administer chemotherapy next week.   Anemia, likely from treatment, no indication for transfusion at this time unless Hb less than 8. AKI on admission, likely from poor po intake and dehydration, creatinine slowly dropping, conitnue to monitor.  HISTORY OF PRESENTING ILLNESS:  Veronica Horton 59 y.o. female is here because of fevers in patient receiving chemoradiation given diagnosis of supraglottic laryngeal cancer.  Chronology  59 year old Caucasian female, with medical history of heavy smoking, COPD on home oxygen 5 L/min,hypertension, presents with fevers and SOB.  Patient is a 59 year old Caucasian female, with past medical history of COPD, on home oxygen 5 L/min continuously, hypertension, presented with worsening dyspnea to any pain hospital on March 10, 2020.  She was initially admitted for acute exacerbation of COPD and placed  on BiPAP, speech evaluation showed erythematous supraglottis and mild pharyngeal phase dysphagia.  CT neck was sent which showed a large soft tissue mass in the supraglottis with narrowing of the trachea.    On November 13 she had direct laryngoscopy with biopsy, await tracheostomy and again this demonstrated a large supraglottic mass involving lingual and laryngeal surface of epiglottis with apparent involvement of right arytenoid, right false vocal cord.  Bilateral true vocal cords did not have gross tumor involvement.  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  She was suggested to proceed with surgery but she declined surgery and wanted to proceed with concurrent chemoradiation.  She was started inpatient given social issues and inability to discharge with proper home health care.    She started concurrent chemoradiation on 03/29/2020 Cycle 2-day 1 released on 04/05/2020 Cycle 3 was delayed a week because of concern for intra-abdominal infection. Cycle 4 was planned for today however she is admitted with fevers and shortness of breath and concern for aspiration pneumonia.  She is a good historian.  I was able to visit her in the hospital.  She tells me that she tried to stay home but had a fever of 103 and hence went to [facility] and was subsequently transferred here.  She is currently on antibiotics for pneumonia. She was found to have patchy airspace infiltrate within the left upper lobe likely infectious with no central obstructing lesion, small bilateral pleural effusions with associated bibasilar atelectasis. CT abdomen pelvis with the previously identified perirectal fat stranding consistent with resolution of infectious or inflammatory proctitis.  She looks well today but  she continues to have high oxygen requirement.  She denies any pain in her throat.  She denies any diarrhea, continues to have some right upper and  right lower quadrant pain.  No dysuria.  She has definitely noticed some worsening cough and shortness of breath in the past week. She is using her tube for nourishment Rest of the pertinent 10 point ROS reviewed and negative.   MEDICAL HISTORY:  Past Medical History:  Diagnosis Date  . Bronchitis   . Class 3 obesity 01/23/2020  . COPD (chronic obstructive pulmonary disease) ([place])   . Degenerative disc disease, lumbar   . Hiatal hernia   . Hypertension     SURGICAL HISTORY: Past Surgical History:  Procedure Laterality Date  . BIOPSY  04/12/2020   Procedure: BIOPSY;  Surgeon: Wilford Corner, MD;  Location: [facility];  Service: Gastroenterology;;  . CHOLECYSTECTOMY    . degenerative bone disease    . DIRECT LARYNGOSCOPY N/A 03/13/2020   Procedure: DIRECT LARYNGOSCOPY WITH BIOPSY;  Surgeon: Jason Coop, DO;  Location: [place];  Service: ENT;  Laterality: N/A;  . FLEXIBLE SIGMOIDOSCOPY N/A 04/12/2020   Procedure: FLEXIBLE SIGMOIDOSCOPY;  Surgeon: Wilford Corner, MD;  Location: [facility];  Service: Gastroenterology;  Laterality: N/A;  . INCISIONAL HERNIA REPAIR N/A 01/15/2019   Procedure: [name] HERNIORRHAPHY WITH MESH;  Surgeon: [name], MD;  Location: [facility];  Service: General;  Laterality: N/A;  . IR GASTROSTOMY TUBE MOD SED  03/22/2020  . OMENTECTOMY N/A 01/15/2019   Procedure: OMENTECTOMY;  Surgeon: [name], MD;  Location: [facility];  Service: General;  Laterality: N/A;  . POLYPECTOMY  04/12/2020   Procedure: POLYPECTOMY;  Surgeon: Wilford Corner, MD;  Location: [facility];  Service: Gastroenterology;;  . TRACHEOSTOMY TUBE PLACEMENT N/A 03/13/2020   Procedure: AWAKE TRACHEOSTOMY;  Surgeon: Jason Coop, DO;  Location: [place];  Service: ENT;  Laterality: N/A;    SOCIAL HISTORY: Social History   Socioeconomic History  . Marital status: Divorced    Spouse name: Not on file  . Number of children: Not on file  . Years of education: Not on  file  . Highest education level: Not on file  Occupational History  . Not on file  Tobacco Use  . Smoking status: Current Every Day Smoker    Packs/day: 1.00  . Smokeless tobacco: Never Used  Substance and Sexual Activity  . Alcohol use: No  . Drug use: No  . Sexual activity: Yes    Birth control/protection: Post-menopausal  Other Topics Concern  . Not on file  Social History Narrative  . Not on file   Social Determinants of Health   Financial Resource Strain: Not on file  Food Insecurity: Not on file  Transportation Needs: Not on file  Physical Activity: Not on file  Stress: Not on file  Social Connections: Not on file  Intimate Partner Violence: Not on file    FAMILY HISTORY: Family History  Problem Relation Age of Onset  . Hypertension Mother   . Sudden Cardiac Death Neg Hx     ALLERGIES:  is allergic to codeine and penicillins.  MEDICATIONS:  Current Facility-Administered Medications  Medication Dose Route Frequency Provider Last Rate Last Admin  . acetaminophen (TYLENOL) tablet 650 mg  650 mg Oral Q6H PRN Wynetta Fines T, MD       Or  . acetaminophen (TYLENOL) suppository 650 mg  650 mg Rectal Q6H PRN Lequita Halt, MD      . arformoterol [facility])  nebulizer solution 15 mcg  15 mcg Nebulization BID Wynetta Fines T, MD   15 mcg at 04/26/20 0810  . bisacodyl (DULCOLAX) suppository 10 mg  10 mg Rectal Daily PRN Wynetta Fines T, MD      . budesonide (PULMICORT) nebulizer solution 0.5 mg  0.5 mg Nebulization BID Wynetta Fines T, MD   0.5 mg at 04/26/20 0820  . cefTRIAXone (ROCEPHIN) 2 g in sodium chloride 0.9 % 100 mL IVPB  2 g Intravenous Q24H Wynetta Fines T, MD 200 mL/hr at 04/25/20 1728 2 g at 04/25/20 1728  . diclofenac Sodium (VOLTAREN) 1 % topical gel 2 g  2 g Topical QID PRN Wynetta Fines T, MD      . enoxaparin (LOVENOX) injection 40 mg  40 mg Subcutaneous Q24H Berle Mull M, MD      . feeding supplement (OSMOLITE 1.5 CAL) liquid 237 mL  237 mL Per Tube 6 X Daily  Lavina Hamman, MD   237 mL at 04/26/20 0532  . feeding supplement (PROSource TF) liquid 45 mL  45 mL Per Tube BID Lavina Hamman, MD   45 mL at 04/26/20 1029  . gabapentin (NEURONTIN) 250 MG/5ML solution 300 mg  300 mg Per Tube QHS Lavina Hamman, MD   300 mg at 04/25/20 2213  . guaiFENesin-dextromethorphan (ROBITUSSIN DM) 100-10 MG/5ML syrup 10 mL  10 mL Oral Q4H PRN Wynetta Fines T, MD      . guaiFENesin-dextromethorphan [facility] DM) 100-10 MG/5ML syrup 5 mL  5 mL Per Tube QID Lavina Hamman, MD   5 mL at 04/26/20 1035  . HYDROcodone-acetaminophen (HYCET) 7.5-325 mg/15 ml solution 10 mL  10 mL Per Tube Q6H PRN Lavina Hamman, MD   10 mL at 04/26/20 0531  . ipratropium-albuterol (DUONEB) 0.5-2.5 (3) MG/3ML nebulizer solution 3 mL  3 mL Nebulization QID Lavina Hamman, MD      . ketotifen (ZADITOR) 0.025 % ophthalmic solution 1 drop  1 drop Both Eyes BID Wynetta Fines T, MD   1 drop at 04/26/20 1036  . lidocaine (LIDODERM) 5 % 1 patch  1 patch Transdermal Q24H Lequita Halt, MD   1 patch at 04/25/20 1727  . metroNIDAZOLE (FLAGYL) tablet 500 mg  500 mg Per Tube Q8H Lequita Halt, MD   500 mg at 04/26/20 0533  . midodrine (PROAMATINE) tablet 5 mg  5 mg Oral BID WC Lavina Hamman, MD      . multivitamin liquid 15 mL  15 mL Per Tube Daily Lavina Hamman, MD   15 mL at 04/26/20 1032  . neomycin-bacitracin-polymyxin (NEOSPORIN) ointment packet   Topical BID Lavina Hamman, MD      . nystatin (MYCOSTATIN) 100000 UNIT/ML suspension 500,000 Units  5 mL Mouth/Throat QID Sharion Settler, NP   500,000 Units at 04/26/20 1029  . ondansetron (ZOFRAN) injection 4 mg  4 mg Intravenous Q8H PRN Sharion Settler, NP      . pantoprazole sodium (PROTONIX) 40 mg/20 mL oral suspension 40 mg  40 mg Per Tube Daily Wynetta Fines T, MD   40 mg at 04/26/20 1134     PHYSICAL EXAMINATION: ECOG PERFORMANCE STATUS: 2 - Symptomatic, <50% confined to bed  Vitals:   04/26/20 0810 04/26/20 1040  BP:    Pulse:    Resp:     Temp:    SpO2: 91% 93%   Filed Weights   04/24/20 1448  Weight: 255 lb 8 oz (115.9 kg)  GENERAL:alert, no distress  SKIN: skin color, texture, turgor are normal, no rashes or significant lesions NECK: supple, palpable LN in the left side, smaller. LUNGS: clear to auscultation in anterior fields HEART: regular rate & rhythm and no murmurs and no lower extremity edema ABDOMEN:abdomen soft, normal BS, mild tenderness in the RUOQ Musculoskeletal:no cyanosis of digits and no clubbing  PSYCH: alert & oriented x 3 with fluent speech NEURO: no focal motor/sensory deficits  LABORATORY DATA:  I have reviewed the data as listed Lab Results  Component Value Date   WBC 4.4 04/26/2020   HGB 9.2 (L) 04/26/2020   HCT 29.2 (L) 04/26/2020   MCV 100.0 04/26/2020   PLT 127 (L) 04/26/2020    RADIOGRAPHIC STUDIES: I have personally reviewed the radiological images as listed and agreed with the findings in the report. CT ABDOMEN PELVIS WO CONTRAST  Result Date: 04/09/2020 CLINICAL DATA:  Intermittent lower quadrant abdominal pain, diminished bowel movements over past 3 days, fever, history COPD, hypertension, hiatal hernia EXAM: CT ABDOMEN AND PELVIS WITHOUT CONTRAST TECHNIQUE: Multidetector CT imaging of the abdomen and pelvis was performed following the standard protocol without IV contrast. Sagittal and coronal MPR images reconstructed from axial data set. COMPARISON:  03/15/2020 FINDINGS: Lower chest: Bibasilar consolidation, volume loss and pleural effusions Hepatobiliary: Gallbladder surgically absent. Liver normal appearance Pancreas: Atrophic, otherwise unremarkable Spleen: Normal appearance Adrenals/Urinary Tract: LEFT renal cysts unchanged. Adrenal glands, kidneys, ureters, and bladder otherwise normal appearance Stomach/Bowel: Scattered radiopacities within stool. Normal appendix. Gastrostomy tube in stomach. Mild diverticulosis of distal descending and proximal sigmoid colon without  evidence of diverticulitis. Significant rectal wall thickening with surrounding perirectal infiltrative changes extending into presacral space favor proctitis though tumor not excluded. Prior ventral hernia repair with recurrent hernia identified at the inferior margin of the repair containing a nonobstructed small bowel loop (see coronal images 81-82). Vascular/Lymphatic: Atherosclerotic calcifications aorta and iliac arteries without aneurysm. No adenopathy. Reproductive: Unremarkable uterus and adnexa Other: Infiltration of fat within small bowel mesentery consistent with fibrosing mesenteritis. No free air or free fluid. Musculoskeletal: Osseous demineralization. IMPRESSION: Distal colonic diverticulosis without evidence of diverticulitis. Significant rectal wall thickening with surrounding perirectal infiltrative changes extending into presacral space favor proctitis though tumor not excluded; recommend correlation with proctoscopy. Prior ventral hernia repair with recurrent hernia at the inferior margin of the repair, containing a nonobstructed small bowel loop. Changes of fibrosing mesenteritis. Bibasilar consolidation, volume loss and pleural effusions. Aortic Atherosclerosis (ICD10-I70.0). Electronically Signed   By: Lavonia Dana M.D.   On: 04/09/2020 13:27   CT CHEST WO CONTRAST  Result Date: 04/13/2020 CLINICAL DATA:  Pleural effusion EXAM: CT CHEST WITHOUT CONTRAST TECHNIQUE: Multidetector CT imaging of the chest was performed following the standard protocol without IV contrast. COMPARISON:  03/15/2020 FINDINGS: Cardiovascular: Extensive coronary artery calcification again noted predominantly within the left anterior descending coronary artery. Global cardiac size within normal limits. No pericardial effusion. Central pulmonary arteries are enlarged in keeping with changes of pulmonary arterial hypertension, similar to that noted on prior examination. Mild atherosclerotic calcification is seen within  the thoracic aorta. Aberrant right subclavian artery noted. Mediastinum/Nodes: Tracheostomy in expected position. No pathologic thoracic adenopathy. The esophagus is unremarkable. Lungs/Pleura: Patchy pulmonary infiltrate is seen within the a left upper lobe, new since prior examination and likely infectious in the acute setting. Bibasilar atelectasis is present with subtotal collapse of the lower lobes bilaterally. Small bilateral pleural effusions are present, left greater than right. No pneumothorax. No central obstructing lesion. Upper Abdomen:  Cholecystectomy has been performed. No acute abnormality. Musculoskeletal: No lytic or blastic bone lesions are seen. Multiple remote left rib fractures are noted no acute bone abnormality. IMPRESSION: Small bilateral pleural effusions with associated bibasilar atelectasis. Patchy airspace infiltrate within the left upper lobe, likely infectious in the acute setting. No central obstructing lesion. Extensive coronary artery calcification. Morphologic changes in keeping with pulmonary arterial hypertension. Aortic Atherosclerosis (ICD10-I70.0). Electronically Signed   By: Fidela Salisbury MD   On: 04/13/2020 20:19   CT ABDOMEN PELVIS W CONTRAST  Result Date: 04/24/2020 CLINICAL DATA:  Abdominal pain, fever, for surge EXAM: CT ABDOMEN AND PELVIS WITH CONTRAST TECHNIQUE: Multidetector CT imaging of the abdomen and pelvis was performed using the standard protocol following bolus administration of intravenous contrast. CONTRAST:  43mL OMNIPAQUE IOHEXOL 300 MG/ML  SOLN COMPARISON:  04/09/2020 FINDINGS: Lower chest: Bandlike scarring and atelectasis of the dependent bilateral lung bases. Multiple nonacute fractures of the posterior left ribs. Hepatobiliary: No focal liver abnormality is seen. Status post cholecystectomy. No biliary dilatation. Pancreas: Unremarkable. No pancreatic ductal dilatation or surrounding inflammatory changes. Spleen: Normal in size without significant  abnormality. Adrenals/Urinary Tract: Adrenal glands are unremarkable. Kidneys are normal, without renal calculi, solid lesion, or hydronephrosis. Bladder is unremarkable. Stomach/Bowel: Percutaneous gastrostomy, balloon within the gastric lumen. Appendix appears normal. No evidence of bowel wall thickening, distention, or inflammatory changes. Vascular/Lymphatic: Aortic atherosclerosis. No enlarged abdominal or pelvic lymph nodes. Reproductive: No mass or other significant abnormality. Other: Broad-based midline ventral abdominal hernia, status post mesh repair. Redemonstrated recurrent inferior hernia component, containing a single, nonobstructed loop of mid small bowel (series 2, image 62). No abdominopelvic ascites. Musculoskeletal: No acute or significant osseous findings. IMPRESSION: 1. No acute CT findings of the abdomen or pelvis to explain abdominal pain. No evidence infectious nidus internal to the abdomen or pelvis. 2. Previously identified perirectal fat stranding is resolved, consistent with resolution of infectious or inflammatory proctitis. 3. Broad-based midline ventral abdominal hernia, status post mesh repair. Redemonstrated recurrent inferior hernia component, containing a single, nonobstructed loop of mid small bowel. Aortic Atherosclerosis (ICD10-I70.0). Electronically Signed   By: Eddie Candle M.D.   On: 04/24/2020 17:05   DG Chest Port 1 View  Result Date: 04/24/2020 CLINICAL DATA:  Questionable sepsis EXAM: PORTABLE CHEST 1 VIEW COMPARISON:  04/11/2020, CT chest, 04/13/2020 FINDINGS: Cardiomegaly. Tracheostomy. Bandlike scarring or atelectasis of the right midlung. There is a background of diffuse, coarse appearing interstitial opacity. No acute appearing airspace opacity. IMPRESSION: 1.  Cardiomegaly. 2.  Tracheostomy. 3. Bandlike scarring or atelectasis of the right midlung. There is a background of diffuse, coarse appearing interstitial opacity, generally corresponding to scarring and  atelectasis seen on prior CT. No acute appearing airspace opacity. Electronically Signed   By: Eddie Candle M.D.   On: 04/24/2020 15:23   DG CHEST PORT 1 VIEW  Result Date: 04/11/2020 CLINICAL DATA:  Hypoxia. EXAM: PORTABLE CHEST 1 VIEW COMPARISON:  April 08, 2020. FINDINGS: Stable cardiomediastinal silhouette. Tracheostomy tube is unchanged. No pneumothorax is noted. Stable left pleural effusion is noted with associated left basilar atelectasis or infiltrate. Right midlung subsegmental atelectasis is noted with probable small right pleural effusion. Bony thorax is unremarkable. IMPRESSION: Stable left pleural effusion with associated left basilar atelectasis or infiltrate. Right midlung subsegmental atelectasis is noted with probable small right pleural effusion. Electronically Signed   By: Marijo Conception M.D.   On: 04/11/2020 13:20   DG CHEST PORT 1 VIEW  Result Date: 04/08/2020 CLINICAL DATA:  Respiratory  failure. EXAM: PORTABLE CHEST 1 VIEW COMPARISON:  April 03, 2020 FINDINGS: There is stable tracheostomy tube positioning. Mild, diffuse chronic appearing increased interstitial lung markings are seen. Mild to moderate severity atelectasis and/or infiltrate is seen within the right lung base. This is similar in severity when compared to the prior study. Marked severity left basilar atelectasis and/or infiltrate is seen and is increased in severity when compared to the prior exam. There is no evidence of a pleural effusion or pneumothorax. There is mild to moderate severity enlargement of the cardiac silhouette. Degenerative changes are seen throughout the thoracic spine. IMPRESSION: 1. Bibasilar atelectasis and/or infiltrate, left greater than right. Electronically Signed   By: Virgina Norfolk M.D.   On: 04/08/2020 16:45   DG CHEST PORT 1 VIEW  Result Date: 04/03/2020 CLINICAL DATA:  Increasing shortness of breath EXAM: PORTABLE CHEST 1 VIEW COMPARISON:  03/31/2020 FINDINGS: Tracheostomy tube  is noted in satisfactory position. Cardiac shadow is enlarged but stable. Some persistent bibasilar opacities are noted although mildly improved when compared with the prior exam. No new focal abnormality is noted. IMPRESSION: Improved aeration as described. Electronically Signed   By: Inez Catalina M.D.   On: 04/03/2020 11:59   DG CHEST PORT 1 VIEW  Result Date: 03/31/2020 CLINICAL DATA:  Pleural effusion EXAM: PORTABLE CHEST 1 VIEW COMPARISON:  03/29/2020 FINDINGS: Tracheostomy unchanged. Lung volumes are small, however, pulmonary insufflation remains stable since prior examination. Bilateral pleural effusions are present small and decreased in size since prior examination. Ovoid opacity within the right mid lung zone likely represents fluid within the fissure. There is bibasilar atelectasis. Cardiac size is within normal limits. No acute bone abnormality. IMPRESSION: Slight interval decrease in small bilateral pleural effusions with associated bibasilar atelectasis. Pulmonary hypoinflation. Electronically Signed   By: Fidela Salisbury MD   On: 03/31/2020 05:41   DG CHEST PORT 1 VIEW  Result Date: 03/29/2020 CLINICAL DATA:  Dyspnea EXAM: PORTABLE CHEST 1 VIEW COMPARISON:  03/25/2020 FINDINGS: Tracheostomy unchanged. Pulmonary insufflation is symmetric though has decreased slightly since prior examination. Small bilateral pleural effusions have developed with associated bibasilar atelectasis. No pneumothorax. Cardiac size within normal limits. Pulmonary vascularity is normal. No acute bone abnormality. IMPRESSION: Interval development of small bilateral pleural effusions with bibasilar compressive atelectasis. Electronically Signed   By: Fidela Salisbury MD   On: 03/29/2020 06:00   DG Abd Portable 1V  Result Date: 04/13/2020 CLINICAL DATA:  Abdominal pain EXAM: PORTABLE ABDOMEN - 1 VIEW COMPARISON:  CT abdomen and pelvis April 09, 2020; abdominal radiograph April 07, 2020 FINDINGS: Gastrostomy  catheter overlying stomach region. Moderate stool in colon. No bowel dilatation or air-fluid level to suggest bowel obstruction. No free air. Postoperative change left pelvic region. Loculated effusion left base. IMPRESSION: No bowel obstruction or free air. Moderate stool in colon. Loculated pleural effusion lateral left base. Electronically Signed   By: Lowella Grip III M.D.   On: 04/13/2020 16:14   DG Abd Portable 1V  Result Date: 04/07/2020 CLINICAL DATA:  Abdomen pain EXAM: PORTABLE ABDOMEN - 1 VIEW COMPARISON:  CT 03/15/2020 FINDINGS: Gastrostomy tube in the left mid abdomen. Nonobstructed bowel-gas pattern. Evidence of prior hernia repair. Curvilinear densities in the right lower quadrant, possible calcifications or retained contrast within appendix. IMPRESSION: Nonobstructed gas pattern. Electronically Signed   By: Donavan Foil M.D.   On: 04/07/2020 21:16   DG Swallowing Func-Speech Pathology  Result Date: 03/27/2020 Objective Swallowing Evaluation: Type of Study: MBS-Modified Barium Swallow Study  Patient Details  Name: ZANNIE LOCASTRO MRN: [id number] Date of Birth: 07-Jan-1961 Today's Date: 03/27/2020 Time: SLP Start Time (ACUTE ONLY): 1215 -SLP Stop Time (ACUTE ONLY): 1235 SLP Time Calculation (min) (ACUTE ONLY): 20 min Past Medical History: Past Medical History: Diagnosis Date . Bronchitis  . Class 3 obesity 01/23/2020 . COPD (chronic obstructive pulmonary disease) ([place])  . Degenerative disc disease, lumbar  . Hiatal hernia  . Hypertension  Past Surgical History: Past Surgical History: Procedure Laterality Date . CHOLECYSTECTOMY   . degenerative bone disease   . DIRECT LARYNGOSCOPY N/A 03/13/2020  Procedure: DIRECT LARYNGOSCOPY WITH BIOPSY;  Surgeon: Jason Coop, DO;  Location: [place];  Service: ENT;  Laterality: N/A; . INCISIONAL HERNIA REPAIR N/A 01/15/2019  Procedure: [name] HERNIORRHAPHY WITH MESH;  Surgeon: [name], MD;  Location: [facility];  Service: General;  Laterality:  N/A; . IR GASTROSTOMY TUBE MOD SED  03/22/2020 . OMENTECTOMY N/A 01/15/2019  Procedure: OMENTECTOMY;  Surgeon: [name], MD;  Location: [facility];  Service: General;  Laterality: N/A; . TRACHEOSTOMY TUBE PLACEMENT N/A 03/13/2020  Procedure: AWAKE TRACHEOSTOMY;  Surgeon: Jason Coop, DO;  Location: [place];  Service: ENT;  Laterality: N/A; HPI: Brekyn Huntoon is a 60 y/o F with history of COPD,chronic respiratory failure on home oxygen at 5 L/min, with large laryngeal mass lesion, now s/p tracheostomy with Shiley 6-0 and direct laryngoscopy and biopsy. 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  Pt is also s/p PEG for nutrition.  Plans are for chemo and radiation concurrently starting while in hospital.  Prior medical history includes smoking and some dyspnea if walking long distanced.  Subjective: pt awake in chair Assessment / Plan / Recommendation [facility]L IP CLINICAL IMPRESSIONS 03/27/2020 Clinical Impression Pt continues with with mild pharyngeal phase dysphagia - obstructive due to mass impacting epiglottic deflection.  In addition, she requires extra time to masticate solids due to dentition.  Swallow trigger is timely with minimal laryngeal penetration of liquid into larynx during the swallow due to decreased epiglottic deflection.  Min vallecular residue with solids without pt awareness - liquid wash assisted to clear.  Pt with prominent cricopharyngeus and appears with trace backflow near this region but view was suboptimal.  Recommend D3/mech soft and thin liquids, po medications whole in puree and follow with liquid wash.  Of note, following testing, pt did cough and slight whitish coating noted on secretions but no aspiration noted during testing.  Suspect pt has some low grade chronic secretion aspiration. SLP Visit Diagnosis Dysphagia, oropharyngeal phase (R13.12) Attention and concentration deficit following -- Frontal lobe and executive function deficit following -- Impact on  safety and function Mild aspiration risk   [facility] IP TREATMENT RECOMMENDATION 03/27/2020 Treatment Recommendations Therapy as outlined in treatment plan below   Prognosis 03/27/2020 Prognosis for Safe Diet Advancement Fair Barriers to Reach Goals Other (Comment) Barriers/Prognosis Comment -- [facility] IP DIET RECOMMENDATION 03/27/2020 SLP Diet Recommendations Dysphagia 3 (Mech soft) solids;Thin liquid Liquid Administration via -- Medication Administration Whole meds with puree Compensations Slow rate;Small sips/bites Postural Changes --   [facility] IP OTHER RECOMMENDATIONS 03/27/2020 Recommended Consults (No Data) Oral Care Recommendations Oral care BID Other Recommendations Clarify dietary restrictions   [facility] IP FOLLOW UP RECOMMENDATIONS 03/27/2020 Follow up Recommendations Skilled Nursing facility   [facility] IP FREQUENCY AND DURATION 03/27/2020 Speech Therapy Frequency (ACUTE ONLY) min 1 x/week Treatment Duration 1 week      [facility] IP ORAL PHASE 03/27/2020 Oral Phase WFL Oral - Pudding Teaspoon -- Oral - Pudding Cup --  Oral - Honey Teaspoon -- Oral - Honey Cup -- Oral - Nectar Teaspoon -- Oral - Nectar Cup -- Oral - Nectar Straw -- Oral - Thin Teaspoon -- Oral - Thin Cup -- Oral - Thin Straw -- Oral - Puree -- Oral - Mech Soft -- Oral - Regular -- Oral - Multi-Consistency -- Oral - Pill -- Oral Phase - Comment --  [facility] IP PHARYNGEAL PHASE 03/27/2020 Pharyngeal Phase -- Pharyngeal- Pudding Teaspoon -- Pharyngeal -- Pharyngeal- Pudding Cup -- Pharyngeal -- Pharyngeal- Honey Teaspoon -- Pharyngeal -- Pharyngeal- Honey Cup -- Pharyngeal -- Pharyngeal- Nectar Teaspoon -- Pharyngeal -- Pharyngeal- Nectar Cup -- Pharyngeal -- Pharyngeal- Nectar Straw Reduced epiglottic inversion;Pharyngeal residue - valleculae Pharyngeal -- Pharyngeal- Thin Teaspoon Reduced epiglottic inversion;Pharyngeal residue - valleculae Pharyngeal -- Pharyngeal- Thin Cup Pharyngeal residue - valleculae;Reduced epiglottic inversion;Penetration/Aspiration during swallow  Pharyngeal Material enters airway, remains ABOVE vocal cords then ejected out Pharyngeal- Thin Straw Reduced epiglottic inversion;Pharyngeal residue - valleculae;Penetration/Aspiration during swallow Pharyngeal Material enters airway, remains ABOVE vocal cords then ejected out Pharyngeal- Puree WFL Pharyngeal -- Pharyngeal- Mechanical Soft -- Pharyngeal -- Pharyngeal- Regular Reduced epiglottic inversion Pharyngeal -- Pharyngeal- Multi-consistency -- Pharyngeal -- Pharyngeal- Pill NT Pharyngeal -- Pharyngeal Comment --  [facility] IP CERVICAL ESOPHAGEAL PHASE 03/27/2020 Cervical Esophageal Phase -- Pudding Teaspoon -- Pudding Cup -- Honey Teaspoon -- Honey Cup -- Nectar Teaspoon -- Nectar Cup -- Nectar Straw -- Thin Teaspoon -- Thin Cup Prominent cricopharyngeal segment Thin Straw -- Puree -- Mechanical Soft -- Regular -- Multi-consistency -- Pill -- Cervical Esophageal Comment appearance of potential minimal backflow of liquid near UES region = suboptimal view - did not backflow into pharynx/larynx [name], MS [facility] SLP Acute Rehab Services Office [phone] Pager [phone] [name] 03/27/2020, 2:31 PM              VAS [place] UPPER EXTREMITY VENOUS DUPLEX  Result Date: 04/16/2020 UPPER VENOUS STUDY  Indications: Swelling, and Pain Comparison Study: no prior Performing Technologist: Abram Sander RVS  Examination Guidelines: A complete evaluation includes B-mode imaging, spectral Doppler, color Doppler, and power Doppler as needed of all accessible portions of each vessel. Bilateral testing is considered an integral part of a complete examination. Limited examinations for reoccurring indications may be performed as noted.  Right Findings: +----------+------------+---------+-----------+----------+-----------------+ RIGHT     CompressiblePhasicitySpontaneousProperties     Summary      +----------+------------+---------+-----------+----------+-----------------+ IJV           Full       Yes       Yes                                 +----------+------------+---------+-----------+----------+-----------------+ Subclavian    Full       Yes       Yes                                +----------+------------+---------+-----------+----------+-----------------+ Axillary      Full       Yes       Yes                                +----------+------------+---------+-----------+----------+-----------------+ Brachial      Full       Yes       Yes                                +----------+------------+---------+-----------+----------+-----------------+  Radial        Full                                                    +----------+------------+---------+-----------+----------+-----------------+ Ulnar         Full                                                    +----------+------------+---------+-----------+----------+-----------------+ Cephalic      None                                  Age Indeterminate +----------+------------+---------+-----------+----------+-----------------+ Basilic       None                                  Age Indeterminate +----------+------------+---------+-----------+----------+-----------------+  Summary:  Right: No evidence of deep vein thrombosis in the upper extremity. Findings consistent with age indeterminate superficial vein thrombosis involving the right cephalic vein and right basilic vein.  *See table(s) above for measurements and observations.  Diagnosing physician: Monica Martinez MD Electronically signed by Monica Martinez MD on 04/16/2020 at 4:45:55 PM.    Final     All questions were answered. The patient knows to call the clinic with any problems, questions or concerns. I spent 50 minutes in the care of this patient including H and P, review of records, counseling and coordination of care.     Benay Pike, MD 04/26/2020 12:07 PM

## 2020-04-26 NOTE — Progress Notes (Signed)
  Speech Language Pathology Treatment: Dysphagia  Patient Details Name: Veronica Horton MRN: [id number] DOB: Dec 21, 1960 Today's Date: 04/26/2020 Time: 1850-1901 SLP Time Calculation (min) (ACUTE ONLY): 11 min  Assessment / Plan / Recommendation Clinical Impression  SLP was notified by RN that pt began violently coughing and choking when trying to consume chicken and mashed potatoes and used the hand bell provided for respiratory emergencies.  SLP followed up with pt - who advised this did not occur to her at home but she says she only consumed soft foods.    RN was providing pt with tube feeding upon SLP entrance to room.  Pt noted to cough on secretions x2 - once with excessive cough, face turning mildly red and expectoration of secretions orally and via trach.  Viscous slightly tanned colored secretions cleared from trachea.     After pt recovered, SLP provided pt with gingerale.  She consumed a single bolus without overt coughing.    Given her low grade chronic secretion aspiration, advised pt to continue po of thin water at least and utilize tube feeding for nutrition. Intention of water is to decrease disuse muscle atrophy.   Pt agreeable to only consume liquids if she tolerates and will stop if coughing.  She stated "I don't want to eat anything, I'll drink."   Hopeful for pt's functional swallow to improve as pna resolves and secretions are diminished.  Will follow closely for dysphagia management and re-initiate swallow exercises as pt can tolerate.       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  Prior medical history includes smoking and some dyspnea if walking long distanced.  She has been on a dys3/thin diet and follow up indicated to provide information including exercises etc for potential side effects of  XRT/chemo.  Pt has been maintained on a dys3/thin diet and tube feeding. Readmitted with pna - concern for aspiration.      SLP Plan  Continue with current plan of care       Recommendations  Diet recommendations: Thin liquid (full liquids) Liquids provided via: Cup;No straw Medication Administration: Crushed with puree Supervision: Patient able to self feed Compensations: Slow rate;Small sips/bites Postural Changes and/or Swallow Maneuvers: Seated upright 90 degrees;Upright 30-60 min after meal                Oral Care Recommendations: Oral care QID Follow up Recommendations: Skilled Nursing facility SLP Visit Diagnosis: Dysphagia, pharyngoesophageal phase (R13.14) Plan: Continue with current plan of care       GO                [name] 04/26/2020, 7:27 PM  [name], MS [facility] SLP Acute Rehab Services Office [phone] Pager [phone]

## 2020-04-26 NOTE — Plan of Care (Signed)
Patient will have a swallow evaluation today.

## 2020-04-27 ENCOUNTER — Ambulatory Visit
Admit: 2020-04-27 | Discharge: 2020-04-27 | Disposition: A | Discharge status: Home / Self Care | Payer: Medicaid Other | Attending: Radiation Oncology | Admitting: Radiation Oncology

## 2020-04-27 ENCOUNTER — Inpatient Hospital Stay (HOSPITAL_COMMUNITY): Payer: Medicaid Other

## 2020-04-27 LAB — BASIC METABOLIC PANEL
Anion gap: 11 (ref 5–15)
BUN: 18 mg/dL (ref 6–20)
CO2: 24 mmol/L (ref 22–32)
Calcium: 7.7 mg/dL — ABNORMAL LOW (ref 8.9–10.3)
Chloride: 102 mmol/L (ref 98–111)
Creatinine, Ser: 1.28 mg/dL — ABNORMAL HIGH (ref 0.44–1.00)
GFR, Estimated: 48 mL/min — ABNORMAL LOW (ref >60)
Glucose, Bld: 92 mg/dL (ref 70–99)
Potassium: 4.2 mmol/L (ref 3.5–5.1)
Sodium: 137 mmol/L (ref 135–145)

## 2020-04-27 LAB — GLUCOSE, CAPILLARY
Glucose-Capillary: 119 mg/dL — ABNORMAL HIGH (ref 70–99)
Glucose-Capillary: 120 mg/dL — ABNORMAL HIGH (ref 70–99)
Glucose-Capillary: 133 mg/dL — ABNORMAL HIGH (ref 70–99)
Glucose-Capillary: 137 mg/dL — ABNORMAL HIGH (ref 70–99)
Glucose-Capillary: 147 mg/dL — ABNORMAL HIGH (ref 70–99)
Glucose-Capillary: 93 mg/dL (ref 70–99)

## 2020-04-27 LAB — CBC
HCT: 30.3 % — ABNORMAL LOW (ref 36.0–46.0)
Hemoglobin: 9.7 g/dL — ABNORMAL LOW (ref 12.0–15.0)
MCH: 31.7 pg (ref 26.0–34.0)
MCHC: 32 g/dL (ref 30.0–36.0)
MCV: 99 fL (ref 80.0–100.0)
Platelets: 151 10*3/uL (ref 150–400)
RBC: 3.06 MIL/uL — ABNORMAL LOW (ref 3.87–5.11)
RDW: 15.9 % — ABNORMAL HIGH (ref 11.5–15.5)
WBC: 5 10*3/uL (ref 4.0–10.5)
nRBC: 0 % (ref 0.0–0.2)

## 2020-04-27 LAB — CULTURE, RESPIRATORY W GRAM STAIN: Culture: NORMAL

## 2020-04-27 LAB — HEPARIN LEVEL (UNFRACTIONATED)
Heparin Unfractionated: 0.14 IU/mL — ABNORMAL LOW (ref 0.30–0.70)
Heparin Unfractionated: 0.36 IU/mL (ref 0.30–0.70)

## 2020-04-27 LAB — MAGNESIUM: Magnesium: 1.9 mg/dL (ref 1.7–2.4)

## 2020-04-27 LAB — OSMOLALITY, URINE: Osmolality, Ur: 192 mOsm/kg — ABNORMAL LOW (ref 300–900)

## 2020-04-27 LAB — NA AND K (SODIUM & POTASSIUM), RAND UR
Potassium Urine: 14 mmol/L
Sodium, Ur: 19 mmol/L

## 2020-04-27 LAB — CREATININE, URINE, RANDOM: Creatinine, Urine: 52.57 mg/dL

## 2020-04-27 LAB — PROCALCITONIN: Procalcitonin: <0.1 ng/mL

## 2020-04-27 MED ORDER — HYDROCOD POLST-CPM POLST ER 10-8 MG/5ML PO SUER: 5.0000 mL | Freq: Two times a day (BID) | ORAL | Status: DC | PRN

## 2020-04-27 MED ORDER — MENTHOL 3 MG MT LOZG: 1.0000 | LOZENGE | OROMUCOSAL | Status: DC | PRN

## 2020-04-27 MED ORDER — DEXAMETHASONE SODIUM PHOSPHATE 4 MG/ML IJ SOLN
4.0000 mg | Freq: Two times a day (BID) | INTRAMUSCULAR | Status: DC
  Administered 2020-04-27 – 2020-04-30 (×6): 4 mg via INTRAVENOUS
  Filled 2020-04-27 (×6): qty 1

## 2020-04-27 MED ORDER — BENZONATATE 100 MG PO CAPS
100.0000 mg | ORAL_CAPSULE | Freq: Three times a day (TID) | ORAL | Status: DC
  Administered 2020-04-27 – 2020-04-30 (×10): 100 mg via ORAL
  Filled 2020-04-27 (×11): qty 1

## 2020-04-27 MED ORDER — HEPARIN BOLUS VIA INFUSION
2500.0000 [IU] | Freq: Once | INTRAVENOUS | Status: AC
  Administered 2020-04-27: 07:00:00 2500 [IU] via INTRAVENOUS
  Filled 2020-04-27: qty 2500

## 2020-04-27 MED ORDER — DIPHENHYDRAMINE HCL 12.5 MG/5ML PO ELIX: 12.5000 mg | ORAL_SOLUTION | Freq: Four times a day (QID) | ORAL | Status: DC | PRN

## 2020-04-27 NOTE — Progress Notes (Signed)
ANTICOAGULATION CONSULT NOTE - follow up  Pharmacy Consult for Heparin Indication: DVT  Allergies  Allergen Reactions  . Codeine Hives    Reports itching only per RN  . Penicillins Hives    Did it involve swelling of the face/tongue/throat, SOB, or low BP? No Did it involve sudden or severe rash/hives, skin peeling, or any reaction on the inside of your mouth or nose? Yes Did you need to seek medical attention at a hospital or doctor's office? Unknown When did it last happen?Over 10 years If all above answers are "NO", may proceed with cephalosporin use.     Patient Measurements: Height: 5\' 5"  (165.1 cm) Weight: 115.9 kg (255 lb 8 oz) IBW/kg (Calculated) : 57 Heparin Dosing Weight: 84.6 kg  Vital Signs: Temp: 98.1 F (36.7 C) (12/27 1952) BP: 101/49 (12/27 1952) Pulse Rate: 61 (12/28 0355)  Labs: Recent Labs    04/24/20 1534 04/25/20 0523 04/25/20 0810 04/26/20 0426 04/27/20 0415  HGB 10.2* 9.0* 9.5* 9.2*  --   HCT 32.9* 27.9* 29.8* 29.2*  --   PLT 187 141* 137* 127*  --   APTT 29  --   --   --   --   LABPROT 14.3  --   --   --   --   INR 1.2  --   --   --   --   HEPARINUNFRC  --   --   --   --  0.14*  CREATININE 1.79* 1.62* 1.75* 1.56*  --     Estimated Creatinine Clearance: 48.8 mL/min (A) (by C-G formula based on SCr of 1.56 mg/dL (H)).   Medical History: Past Medical History:  Diagnosis Date  . Bronchitis   . Class 3 obesity 01/23/2020  . COPD (chronic obstructive pulmonary disease) ([place])   . Degenerative disc disease, lumbar   . Hiatal hernia   . Hypertension     Medications:  Scheduled:  . arformoterol  15 mcg Nebulization BID  . budesonide  0.5 mg Nebulization BID  . feeding supplement (OSMOLITE 1.5 CAL)  237 mL Per Tube 6 X Daily  . feeding supplement (PROSource TF)  45 mL Per Tube BID  . gabapentin  300 mg Per Tube QHS  . guaiFENesin-dextromethorphan  5 mL Per Tube QID  . ipratropium-albuterol  3 mL Nebulization QID  . ketotifen  1  drop Both Eyes BID  . lidocaine  1 patch Transdermal Q24H  . metroNIDAZOLE  500 mg Per Tube Q8H  . midodrine  5 mg Oral BID WC  . multivitamin  15 mL Per Tube Daily  . neomycin-bacitracin-polymyxin   Topical BID  . nystatin  5 mL Mouth/Throat QID  . pantoprazole sodium  40 mg Per Tube Daily  . saccharomyces boulardii  250 mg Per Tube BID   Infusions:  . cefTRIAXone (ROCEPHIN)  IV 2 g (04/26/20 1825)  . 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  Pharmacy is now consulted to dose IV heparin for acute DVT.  04/27/2020 HL 0.14 sub-therapeutic on 1500 units/hr Hgb 9.2, plts 127 (12/27) No bleeding or line issues per RN  Goal of Therapy:  Heparin level 0.3-0.7 units/ml Monitor platelets by anticoagulation protocol: Yes   Plan:   Heparin 2500 units IV bolus  Increase Heparin IV infusion to 1750 units/hr  Check heparin level in 8hrs  Daily heparin level and CBC  Monitor for signs/symptoms of bleeding  Dolly Rias RPh 04/27/2020, 5:09 AM

## 2020-04-27 NOTE — Progress Notes (Signed)
Request received for IV restart.  Patient currently has IV with heparin.  Per micromedex rocephin and heparin are compatible.  Bri, RN made aware.  Will re consult if other IV meds ordered.

## 2020-04-27 NOTE — Progress Notes (Signed)
ANTICOAGULATION CONSULT NOTE - Follow Up Consult  Pharmacy Consult for heparin Indication: acute DVT  Allergies  Allergen Reactions  . Codeine Hives    Reports itching only per RN  . Penicillins Hives    Did it involve swelling of the face/tongue/throat, SOB, or low BP? No Did it involve sudden or severe rash/hives, skin peeling, or any reaction on the inside of your mouth or nose? Yes Did you need to seek medical attention at a hospital or doctor's office? Unknown When did it last happen?Over 10 years If all above answers are "NO", may proceed with cephalosporin use.     Patient Measurements: Height: 5\' 5"  (165.1 cm) Weight: 120.4 kg (265 lb 6.9 oz) IBW/kg (Calculated) : 57 Heparin Dosing Weight: 85kg  Vital Signs: Temp: 98.3 F (36.8 C) (12/28 0543) BP: 103/61 (12/28 0543) Pulse Rate: 55 (12/28 0543)  Labs: Recent Labs    04/24/20 1534 04/25/20 0523 04/25/20 0810 04/26/20 0426 04/27/20 0415  HGB 10.2* 9.0* 9.5* 9.2*  --   HCT 32.9* 27.9* 29.8* 29.2*  --   PLT 187 141* 137* 127*  --   APTT 29  --   --   --   --   LABPROT 14.3  --   --   --   --   INR 1.2  --   --   --   --   HEPARINUNFRC  --   --   --   --  0.14*  CREATININE 1.79* 1.62* 1.75* 1.56*  --     Estimated Creatinine Clearance: 49.9 mL/min (A) (by C-G formula based on SCr of 1.56 mg/dL (H)).   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 LE doppler on 12/27 showed acute DVT.  She's currently on heparin drip for VTE.  - 12/16 UE doppler: No evidence of deep vein thrombosis in the upper extremity. Findings consistent with age indeterminate superficial vein thrombosis involving the right cephalic vein and right basilic vein - 76/73 LE doppler: acute deep vein thrombosis involving the right  popliteal vein  Today, 04/27/2020: - heparin level drawn at 1:48p is therapeutic at  0.36 with rate running at 1750 units/hr - cbc stable - no bleeding documented  Goal of Therapy:  Heparin level 0.3-0.7 units/ml Monitor platelets by anticoagulation protocol: Yes   Plan:  - continue heparin drip at 1750 units/hr - check another heparin level at 8pm to ensure level is still therapeutic before changing to daily monitoring - monitor for s/sx bleeding  Lariyah Shetterly P 04/27/2020,7:57 AM

## 2020-04-27 NOTE — Progress Notes (Signed)
PT Cancellation Note  Patient Details Name: Veronica Horton MRN: [id number] DOB: Jun 03, 1960   Cancelled Treatment:    Reason Eval/Treat Not Completed: Patient at procedure or test/unavailable (pt out of room at radiation. Will follow.)  Philomena Doheny PT 04/27/2020  Acute Rehabilitation Services Pager [phone] Office [phone]

## 2020-04-27 NOTE — Plan of Care (Signed)
  Problem: Health Behavior/Discharge Planning: Goal: Ability to manage health-related needs will improve Outcome: Progressing   Problem: Activity: Goal: Risk for activity intolerance will decrease Outcome: Progressing   Problem: Clinical Measurements: Goal: Ability to maintain clinical measurements within normal limits will improve Outcome: Progressing Goal: Will remain free from infection Outcome: Progressing Goal: Diagnostic test results will improve Outcome: Progressing Goal: Respiratory complications will improve Outcome: Progressing Goal: Cardiovascular complication will be avoided Outcome: Progressing

## 2020-04-27 NOTE — Progress Notes (Signed)
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 Presents with concerns for aspiration pneumonia. Currently plan is continue current care.  Assessment and Plan: 1.  Sepsis secondary to aspiration pneumonia, POA Acute on chronic hypoxic respiratory failure, POA Meeting SIRS criteria on admission with hypotension, fever, worsening hypoxia. On 5 LPM oxygen at baseline. Currently requiring 60% FiO2. Patient was given IV fluids. Currently started on ceftriaxone and Flagyl. Speech therapy consulted, MBS was completed patient was able to tolerate dysphagia 3 thin liquid diet.  Later when patient was given dysphagia 3 diet she started coughing violently and is requesting to remain on full liquid diet only. Follow-up on cultures.  So far negative Supportive care for cough  2.  Acute kidney injury Renal function improving.  Monitor.   Currently appears to be volume overloaded although renal function still not back to baseline. Discontinue IV fluids and monitor. Urine studies consistent with prerenal etiology although it can be seen with heart failure and contrast-induced nephropathy as well.  3.  COPD Continue inhalers.  Add steroid  4.  Laryngeal invasive carcinoma Outpatient follow-up with oncology. Monitor.  5.  Chronic venous insufficiency Acute DVT Currently stable. Has chronic swelling in the leg. Also reports chronic leg pain. Doppler last week showed SVT upper extremity. Doppler positive for acute DVT on right leg today. Tolerated IV heparin although on hold on 12/28 based on the CT scan evidence of vasogenic edema in the cerebrum.  6.  Morbid  obesity Patient is a patient at high risk for poor outcome. Currently actually has tracheostomy which will improve airway resistance Body mass index is 44.17 kg/m.  Nutrition Problem: Inadequate oral intake Etiology: cancer and cancer related treatments,dysphagia Interventions: Interventions: MVI,Tube feeding  7.  Abnormal CT scan CT soft tissue neck shows evidence of potential vasogenic edema on the right posterior cerebral area. Discontinue heparin for now. CT head stat. Add Decadron.  Diet: Full liquid diet DVT Prophylaxis:   Place and maintain sequential compression device Start: 04/26/20 1152 Place TED hose Start: 04/26/20 1152    Advance goals of care discussion: Limited code  Family Communication: no family was present at bedside, at the time of interview.  Discussed with sister on the phone.  Disposition:  Status is: Inpatient  Remains inpatient appropriate because:IV treatments appropriate due to intensity of illness or inability to take PO and Inpatient level of care appropriate due to severity of illness  Dispo: The patient is from: Home              Anticipated d/c is to: Home              Anticipated d/c date is: > 3 days              Patient currently is not medically stable to d/c.  Subjective: Cough still present.  No nausea no vomiting.  No focal deficit.  No chest pain.  Abdominal pain.  No bleeding.  Physical Exam: General: Appear in moderate distress, no Rash; Oral Mucosa Clear, moist. no Abnormal Neck Mass Or lumps, Conjunctiva normal  Cardiovascular: S1 and S2 Present, no Murmur, Respiratory: increased respiratory effort, Bilateral Air entry present and bilateral  Crackles, bilateral expiratory wheezes Abdomen: Bowel Sound present, Soft and no tenderness Extremities: trace Pedal edema Neurology: alert and oriented to time, place, and person affect appropriate. no new focal deficit Gait not checked due to patient safety concerns    Vitals:    04/27/20 1139 04/27/20 1141 04/27/20 1328 04/27/20 1527  BP:   132/62   Pulse:  (!) 55 62   Resp:  15 16   Temp:   98.6 F (37 C)   TempSrc:   Oral   SpO2: 93% 95% 95% 93%  Weight:      Height:        Intake/Output Summary (Last 24 hours) at 04/27/2020 1929 Last data filed at 04/27/2020 1735 Gross per 24 hour  Intake 2587.5 ml  Output 1082 ml  Net 1505.5 ml   Filed Weights   04/24/20 1448 04/27/20 0543  Weight: 115.9 kg 120.4 kg    Data Reviewed: I have personally reviewed and interpreted daily labs, tele strips, imagings as discussed above. I reviewed all nursing notes, pharmacy notes, vitals, pertinent old records I have discussed plan of care as described above with RN and patient/family.  CBC: Recent Labs  Lab 04/24/20 1534 04/25/20 0523 04/25/20 0810 04/26/20 0426 04/27/20 1348  WBC 8.5 4.7 4.8 4.4 5.0  NEUTROABS 7.1  --  3.5 3.0  --   HGB 10.2* 9.0* 9.5* 9.2* 9.7*  HCT 32.9* 27.9* 29.8* 29.2* 30.3*  MCV 99.7 99.3 99.3 100.0 99.0  PLT 187 141* 137* 127* 619   Basic Metabolic Panel: Recent Labs  Lab 04/24/20 1534 04/25/20 0523 04/25/20 0810 04/26/20 0426 04/27/20 0415  NA 134* 135 135 136 137  K 5.0 4.4 4.6 4.5 4.2  CL 98 99 99 101 102  CO2 29 27 28 28 24   GLUCOSE 108* 85 88 93 92  BUN 25* 23* 24* 22* 18  CREATININE 1.79* 1.62* 1.75* 1.56* 1.28*  CALCIUM 8.4* 8.1* 8.3* 8.0* 7.7*  MG  --   --  1.9 1.7 1.9    Studies: CT SOFT TISSUE NECK WO CONTRAST  Result Date: 04/27/2020 CLINICAL DATA:  Obstructive dysphagia, laryngeal squamous cell carcinoma post tracheostomy EXAM: CT NECK WITHOUT CONTRAST TECHNIQUE: Multidetector CT imaging of the neck was performed following the standard protocol without intravenous contrast. COMPARISON:  03/11/2020 FINDINGS: Pharynx and larynx: Abnormal soft tissue thickening involving the supraglottic larynx and hypopharynx as seen previously with paraglottic extension. True cords may be spared but submucosal involvement is  not excluded. Cartilage involvement as previously described. Measurement is difficult but there has been a decrease in size with improved visualization of the airway. Salivary glands: Unremarkable. Thyroid: Unremarkable. Lymph nodes: Suboptimal evaluation in the absence of intravenous contrast. Abnormal left level 3 node (axial series 2, image 78) is similar in size. Left supraclavicular node on image 89 has increased in size measuring 1.5 x 0.9 cm (previously 1.2 x 0.7 cm). Vascular: No significant abnormality on this noncontrast study. Limited intracranial: Hypoattenuation in the right posterior cerebral white matter. There is mild mass effect. Partially imaged focus of mineralization at the superior margin of this region. Visualized orbits: Unremarkable. Mastoids and visualized paranasal sinuses: Mild paranasal sinus mucosal thickening. Minimal right mastoid opacification. Skeleton: No acute osseous abnormality. Upper chest: Refer to dedicated chest imaging. Other: None. IMPRESSION: Similar extent but decreased bulkiness of supraglottic tumor with improved visualization of airway. Similar size of enlarged left level 3 node. Increase in size of left supraclavicular node. Hypoattenuation in the right posterior cerebral white  matter. This was not imaged on the prior study and may reflect vasogenic edema. MRI with contrast recommended. These results will be called to the ordering clinician or representative by the Radiologist Assistant, and communication documented in the PACS or [employer]. Electronically Signed   By: Macy Mis M.D.   On: 04/27/2020 15:22   CT CHEST WO CONTRAST  Result Date: 04/27/2020 CLINICAL DATA:  Pneumonia. EXAM: CT CHEST WITHOUT CONTRAST TECHNIQUE: Multidetector CT imaging of the chest was performed following the standard protocol without IV contrast. COMPARISON:  April 13, 2020. FINDINGS: Cardiovascular: No evidence of thoracic aortic aneurysm. Mild cardiomegaly is noted.  No pericardial effusion. Mediastinum/Nodes: Tracheostomy tube is in good position. The esophagus is unremarkable. No significant adenopathy is noted. Thyroid gland is unremarkable. Lungs/Pleura: No pneumothorax is noted. Small bilateral pleural effusions are noted with adjacent subsegmental atelectasis or infiltrates. Increased patchy opacities are noted in both upper lobes concerning for multifocal pneumonia. Upper Abdomen: No acute abnormality. Musculoskeletal: No chest wall mass or suspicious bone lesions identified. IMPRESSION: 1. Small bilateral pleural effusions are noted with adjacent subsegmental atelectasis or infiltrates. 2. Increased patchy opacities are noted in both upper lobes concerning for multifocal pneumonia. 3. Tracheostomy tube is in good position. Electronically Signed   By: Marijo Conception M.D.   On: 04/27/2020 15:10    Scheduled Meds: . arformoterol  15 mcg Nebulization BID  . benzonatate  100 mg Oral TID  . budesonide  0.5 mg Nebulization BID  . dexamethasone (DECADRON) injection  4 mg Intravenous Q12H  . feeding supplement (OSMOLITE 1.5 CAL)  237 mL Per Tube 6 X Daily  . feeding supplement (PROSource TF)  45 mL Per Tube BID  . gabapentin  300 mg Per Tube QHS  . guaiFENesin-dextromethorphan  5 mL Per Tube QID  . ipratropium-albuterol  3 mL Nebulization QID  . ketotifen  1 drop Both Eyes BID  . lidocaine  1 patch Transdermal Q24H  . metroNIDAZOLE  500 mg Per Tube Q8H  . midodrine  5 mg Oral BID WC  . multivitamin  15 mL Per Tube Daily  . neomycin-bacitracin-polymyxin   Topical BID  . nystatin  5 mL Mouth/Throat QID  . pantoprazole sodium  40 mg Per Tube Daily  . saccharomyces boulardii  250 mg Per Tube BID   Continuous Infusions: . cefTRIAXone (ROCEPHIN)  IV 2 g (04/27/20 1735)   PRN Meds: acetaminophen **OR** acetaminophen, bisacodyl, chlorpheniramine-HYDROcodone, diclofenac Sodium, diphenhydrAMINE, guaiFENesin-dextromethorphan, HYDROcodone-acetaminophen,  menthol-cetylpyridinium, ondansetron (ZOFRAN) IV  Time spent: 35 minutes  Author: Berle Mull, MD [facility] 04/27/2020 7:29 PM  To reach On-call, see care teams to locate the attending and reach out via [email or web address]. Between 7PM-7AM, please contact night-coverage If you still have difficulty reaching the attending provider, please page the [facility] (Director on Call) for [facility] on amion for assistance.

## 2020-04-28 ENCOUNTER — Inpatient Hospital Stay: Payer: Medicaid Other

## 2020-04-28 ENCOUNTER — Encounter: Payer: Medicaid Other | Admitting: Nutrition

## 2020-04-28 ENCOUNTER — Ambulatory Visit
Admit: 2020-04-28 | Discharge: 2020-04-28 | Disposition: A | Discharge status: Home / Self Care | Payer: Medicaid Other | Attending: Radiation Oncology | Admitting: Radiation Oncology

## 2020-04-28 LAB — CBC
HCT: 29.1 % — ABNORMAL LOW (ref 36.0–46.0)
Hemoglobin: 9.3 g/dL — ABNORMAL LOW (ref 12.0–15.0)
MCH: 31.7 pg (ref 26.0–34.0)
MCHC: 32 g/dL (ref 30.0–36.0)
MCV: 99.3 fL (ref 80.0–100.0)
Platelets: 141 10*3/uL — ABNORMAL LOW (ref 150–400)
RBC: 2.93 MIL/uL — ABNORMAL LOW (ref 3.87–5.11)
RDW: 15.9 % — ABNORMAL HIGH (ref 11.5–15.5)
WBC: 7 10*3/uL (ref 4.0–10.5)
nRBC: 0 % (ref 0.0–0.2)

## 2020-04-28 LAB — BASIC METABOLIC PANEL
Anion gap: 8 (ref 5–15)
BUN: 15 mg/dL (ref 6–20)
CO2: 27 mmol/L (ref 22–32)
Calcium: 8.4 mg/dL — ABNORMAL LOW (ref 8.9–10.3)
Chloride: 101 mmol/L (ref 98–111)
Creatinine, Ser: 1.25 mg/dL — ABNORMAL HIGH (ref 0.44–1.00)
GFR, Estimated: 49 mL/min — ABNORMAL LOW (ref >60)
Glucose, Bld: 165 mg/dL — ABNORMAL HIGH (ref 70–99)
Potassium: 4.7 mmol/L (ref 3.5–5.1)
Sodium: 136 mmol/L (ref 135–145)

## 2020-04-28 LAB — GLUCOSE, CAPILLARY
Glucose-Capillary: 105 mg/dL — ABNORMAL HIGH (ref 70–99)
Glucose-Capillary: 137 mg/dL — ABNORMAL HIGH (ref 70–99)
Glucose-Capillary: 148 mg/dL — ABNORMAL HIGH (ref 70–99)
Glucose-Capillary: 150 mg/dL — ABNORMAL HIGH (ref 70–99)
Glucose-Capillary: 162 mg/dL — ABNORMAL HIGH (ref 70–99)
Glucose-Capillary: 168 mg/dL — ABNORMAL HIGH (ref 70–99)

## 2020-04-28 LAB — MAGNESIUM: Magnesium: 2 mg/dL (ref 1.7–2.4)

## 2020-04-28 LAB — UREA NITROGEN, URINE: Urea Nitrogen, Ur: 273 mg/dL

## 2020-04-28 MED ORDER — IPRATROPIUM-ALBUTEROL 0.5-2.5 (3) MG/3ML IN SOLN
3.0000 mL | Freq: Two times a day (BID) | RESPIRATORY_TRACT | Status: DC
  Administered 2020-04-28 – 2020-04-30 (×4): 3 mL via RESPIRATORY_TRACT
  Filled 2020-04-28 (×4): qty 3

## 2020-04-28 MED ORDER — ESCITALOPRAM OXALATE 10 MG PO TABS
5.0000 mg | ORAL_TABLET | Freq: Every day | ORAL | Status: DC
  Administered 2020-04-28 – 2020-04-30 (×3): 5 mg via ORAL
  Filled 2020-04-28 (×3): qty 1

## 2020-04-28 MED ORDER — LOPERAMIDE HCL 2 MG PO CAPS
2.0000 mg | ORAL_CAPSULE | ORAL | Status: DC | PRN
  Administered 2020-04-28 – 2020-04-30 (×6): 2 mg via ORAL
  Filled 2020-04-28 (×6): qty 1

## 2020-04-28 NOTE — Evaluation (Signed)
Occupational Therapy Evaluation Patient Details Name: Veronica Horton MRN: [id number] DOB: 02-14-1961 Today's Date: 04/28/2020    History of Present Illness Veronica Horton is a 59 year old female with medical history significant for COPD with chronic hypoxic respiratory failure on 5 L, HTN, tobacco use, obesity class 3 who presented on 11/10 with worsening shortness of breath and increased confusion. Pt found to be in COPD exacerbation, hospital course at [facility] complicated by dysphasia, pt found to have a laryngeal mass. Pt transferred to [facility]. Pt underwent trach placement on 11/13 s/p layrngeal mass biopsy. 11/22 G-tube. DC 04/16/20. Pt now admitted 04/24/20 with aspiration PNA.   Clinical Impression   Veronica Horton is a 59 year old woman admitted to hospital with above medical history. On evaluation patient presents with decreased activity tolerance, generalized weakness, decreased balance and impaired cardiopulmonary status resulting in a decline in baseline abilities. On evaluation patient needing more assistance with LB ADLs and toileting. Patient will benefit from skilled OT services while in hospital to improve deficits and learn compensatory strategies as needed in order to return home at discharge.       Follow Up Recommendations  Home health OT;Supervision/Assistance - 24 hour (24/7 in case of respiratory emergency)    Equipment Recommendations  Tub/shower seat    Recommendations for Other Services       Precautions / Restrictions Precautions Precautions: Fall Precaution Comments: trach collar, G-tube, reports fall at home (or sliding off the bed because she fell asleep?) Restrictions Weight Bearing Restrictions: No      Mobility Bed Mobility Overal bed mobility: Needs Assistance Bed Mobility: Supine to Sit     Supine to sit: Min guard;HOB elevated     General bed mobility comments: Min guard for transfer to side of bed and use of bed rail.     Transfers Overall transfer level: Needs assistance Equipment used: Rolling walker (2 wheeled) Transfers: Sit to/from [facility] Sit to Stand: Min guard Stand pivot transfers: Min guard       General transfer comment: Min guard for standing. patient needed hand hold on bed rail during pericare. Min guard with RW to transfer to recliner. patient on 10L o2 sat 92%.    Balance Overall balance assessment: Mild deficits observed, not formally tested                                         ADL either performed or assessed with clinical judgement   ADL   Eating/Feeding: Independent   Grooming: Set up;Sitting   Upper Body Bathing: Set up;Sitting   Lower Body Bathing: Set up;Sit to/from stand;Moderate assistance   Upper Body Dressing : Set up;Sitting   Lower Body Dressing: Sit to/from stand;Moderate assistance Lower Body Dressing Details (indicate cue type and reason): assistance to don socks Toilet Transfer: Min guard;RW;BSC   Toileting- Clothing Manipulation and Hygiene: Moderate assistance;Sit to/from stand Toileting - Clothing Manipulation Details (indicate cue type and reason): assistance for managing clothing and cleaning backside. Patient able to stabilize with one hand and clean periarea.             Vision Patient Visual Report: No change from baseline       Perception     Praxis      Pertinent Vitals/Pain Pain Assessment: No/denies pain     Hand Dominance Right   Extremity/Trunk Assessment Upper Extremity Assessment Upper Extremity  Assessment: Overall WFL for tasks assessed   Lower Extremity Assessment Lower Extremity Assessment: Defer to PT evaluation   Cervical / Trunk Assessment Cervical / Trunk Assessment: Normal   Communication Communication Communication: Tracheostomy   Cognition Arousal/Alertness: Awake/alert Behavior During Therapy: WFL for tasks assessed/performed Overall Cognitive Status: Within  Functional Limits for tasks assessed                                     General Comments       Exercises     Shoulder Instructions      Home Living Family/patient expects to be discharged to:: Private residence Living Arrangements: Spouse/significant other Available Help at Discharge: Friend(s);Available 24 hours/day Type of Home: Mobile home Home Access: Ramped entrance     Home Layout: One level     Bathroom Shower/Tub: Occupational psychologist: Standard Bathroom Accessibility: Yes   Home Equipment: Environmental consultant - 2 wheels;Cane - single point          Prior Functioning/Environment Level of Independence: Independent with assistive device(s);Needs assistance  Gait / Transfers Assistance Needed: Reports using RW since being home. Reports she can walk to the kitchen. ADL's / Homemaking Assistance Needed: Has been able to perform ADLs. Reports predominantly wearing night gowns for comfort. Hasn't been able to get a shower chair.            OT Problem List: Decreased activity tolerance;Impaired balance (sitting and/or standing);Cardiopulmonary status limiting activity;Increased edema      OT Treatment/Interventions: Self-care/ADL training;DME and/or AE instruction;Therapeutic activities;Patient/family education;Balance training    OT Goals(Current goals can be found in the care plan section) Acute Rehab OT Goals Patient Stated Goal: to maintain functional abilities and strength OT Goal Formulation: With patient Time For Goal Achievement: 05/07/20 Potential to Achieve Goals: Good  OT Frequency: Min 2X/week   Barriers to D/C:            Co-evaluation              AM-PAC OT "6 Clicks" Daily Activity     Outcome Measure Help from another person eating meals?: None Help from another person taking care of personal grooming?: A Little Help from another person toileting, which includes using toliet, bedpan, or urinal?: A Lot Help from  another person bathing (including washing, rinsing, drying)?: A Little Help from another person to put on and taking off regular upper body clothing?: A Little Help from another person to put on and taking off regular lower body clothing?: A Lot 6 Click Score: 17   End of Session Equipment Utilized During Treatment: Rolling walker;Oxygen Nurse Communication:  (okay to see per RN)  Activity Tolerance: Patient tolerated treatment well Patient left: in chair;with call bell/phone within reach;with family/visitor present  OT Visit Diagnosis: Unsteadiness on feet (R26.81);Muscle weakness (generalized) (M62.81)                Time: 5631-4970 OT Time Calculation (min): 23 min Charges:  OT General Charges $OT Visit: 1 Visit OT Evaluation $OT Eval Moderate Complexity: 1 Mod OT Treatments $Self Care/Home Management : 8-22 mins  Makyiah Horton, OTR/L [place] [phone] Pager: [phone]   Lenward Chancellor 04/28/2020, 3:45 PM

## 2020-04-28 NOTE — Progress Notes (Signed)
PT Cancellation Note  Patient Details Name: Veronica Horton MRN: [id number] DOB: Sep 27, 1960   Cancelled Treatment:    Reason Eval/Treat Not Completed:  Attempted PT eval. Pt requested PT check back another time "later today". She stated she is going to radiation this morning. Will check back as schedule allows.    [facility]  Office: [phone] Pager: [phone]

## 2020-04-28 NOTE — Progress Notes (Signed)
PT denies need for trach suctioning at this time. 

## 2020-04-28 NOTE — TOC Progression Note (Addendum)
Transition of Care [facility]) - Progression Note    Patient Details  Name: [name] MRN: [id number] Date of Birth: Feb 24, 1961  Transition of Care [facility]) CM/SW Contact  Kelise Kuch, [name], RN Phone Number: 04/28/2020, 11:42 AM  Clinical Narrative:  Refer to prior CM notes on 12/10 HHRN-patient is not appropriate for [facility] with a trach 30 days or less old. Also patient does not have health insurance, charity covering [facility] is not accepting d/t staffing. No HHC for HHRN. Patient/sis [name] aware, & voiced understanding.Family able to be instructed on wound care. Family will transport home on own.    Expected Discharge Plan: Home/Self Care Barriers to Discharge: Continued Medical Work up  Expected Discharge Plan and Services Expected Discharge Plan: Home/Self Care   Discharge Planning Services: CM Consult Post Acute Care Choice: Durable Medical Equipment ([employer]-PEG/Trach supplies/oxygen) Living arrangements for the past 2 months: Single Family Home                                       Social Determinants of Health (SDOH) Interventions    Readmission Risk Interventions Readmission Risk Prevention Plan 04/26/2020 01/20/2019  Medication Screening - Complete  Transportation Screening Complete Complete  Medication Review ([place]) Complete -  PCP or Specialist appointment within 3-5 days of discharge Complete -  Independence or Home Care Consult Complete -  SW Recovery Care/Counseling Consult Complete -  [place] Patient Refused -  Some recent data might be hidden

## 2020-04-28 NOTE — Evaluation (Signed)
Passy-Muir Speaking Valve - Evaluation Patient Details  Name: Veronica Horton MRN: [id number] Date of Birth: [birth year]  Today's Date: 04/28/2020 Time: 8270-7867 SLP Time Calculation (min) (ACUTE ONLY): 40 min  Past Medical History:  Past Medical History:  Diagnosis Date  . Bronchitis   . Class 3 obesity 01/23/2020  . COPD (chronic obstructive pulmonary disease) ([place])   . Degenerative disc disease, lumbar   . Hiatal hernia   . Hypertension    Past Surgical History:  Past Surgical History:  Procedure Laterality Date  . BIOPSY  04/12/2020   Procedure: BIOPSY;  Surgeon: Wilford Corner, MD;  Location: [facility];  Service: Gastroenterology;;  . CHOLECYSTECTOMY    . degenerative bone disease    . DIRECT LARYNGOSCOPY N/A 03/13/2020   Procedure: DIRECT LARYNGOSCOPY WITH BIOPSY;  Surgeon: Jason Coop, DO;  Location: [place];  Service: ENT;  Laterality: N/A;  . FLEXIBLE SIGMOIDOSCOPY N/A 04/12/2020   Procedure: FLEXIBLE SIGMOIDOSCOPY;  Surgeon: Wilford Corner, MD;  Location: [facility];  Service: Gastroenterology;  Laterality: N/A;  . INCISIONAL HERNIA REPAIR N/A 01/15/2019   Procedure: [name] HERNIORRHAPHY WITH MESH;  Surgeon: [name], MD;  Location: [facility];  Service: General;  Laterality: N/A;  . IR GASTROSTOMY TUBE MOD SED  03/22/2020  . OMENTECTOMY N/A 01/15/2019   Procedure: OMENTECTOMY;  Surgeon: [name], MD;  Location: [facility];  Service: General;  Laterality: N/A;  . POLYPECTOMY  04/12/2020   Procedure: POLYPECTOMY;  Surgeon: Wilford Corner, MD;  Location: [facility];  Service: Gastroenterology;;  . TRACHEOSTOMY TUBE PLACEMENT N/A 03/13/2020   Procedure: AWAKE TRACHEOSTOMY;  Surgeon: Jason Coop, DO;  Location: [place];  Service: ENT;  Laterality: N/A;   HPI:  Veronica Horton is a 59 y/o F with history of COPD,chronic respiratory failure on home oxygen at 5 L/min, laryngeal cancer s/p tracheostomy, PEG and is undergoing XRT/chemo.  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  Prior medical history includes smoking and some dyspnea if walking long distanced.  She has been on a dys3/thin diet and follow up indicated to provide information including exercises etc for potential side effects of XRT/chemo.  Pt has been maintained on a dys3/thin diet and tube feeding. Readmitted with pna - concern for aspiration.  Pt has a trach and its able to move air around it, thus PMSV adivsed to be tried.   Assessment / Plan / Recommendation Clinical Impression  Trial of PMSV completed today as pt able to redirect air around trach through upper airway fortunately.  She tolerated this PMSV for 30 minutes with all vitals stable.  Initially pt attempted to speak on inhalation but with min cues was able to adjust to exhalation for phonation.  She benefited from reminders to use her voice as she tends to whisper - since this has been her method of "speaking" since was able after she received her trach.  Minimal breath stacking initially noted with congestion hear near larynx - cues to cough and clear helped to remove mild amount of secretions via trach.  No further episodes of breath stacking.   Pt called her family (sister and husband) to surprise them by speaking to them with her valve in place. Recommend intially to allow PMSV with full supervision. Will follow up for tolerance, increase in usage.  Assure pt's secretions are clear is necessary before valve.  Pt is making great progress! SLP Visit Diagnosis: Aphonia (R49.1)    SLP Assessment  Patient needs continued [facility]  Follow Up Recommendations  Other (comment) (tbd)    Frequency and Duration min 2x/week  2 weeks    PMSV Trial PMSV was placed for: communication/phonation Able to redirect subglottic air through upper airway: Yes Able to Attain Phonation: Yes Voice Quality: Hoarse;Low vocal intensity Able to Expectorate Secretions: Yes Level of Secretion Expectoration  with PMSV: Oral Breath Support for Phonation: Moderately decreased Intelligibility: Intelligible SpO2 During Trial: 92 % Pulse During Trial: 63 Behavior: Alert;Cooperative;Expresses self well;Good eye contact;Responsive to questions   Tracheostomy Tube  Additional Tracheostomy Tube Assessment Fenestrated: No Secretion Description: slightly viscous, white in color Frequency of Tracheal Suctioning: pt reports twice today Level of Secretion Expectoration: Tracheal;Oral    Vent Dependency  Vent Dependent: No FiO2 (%): 40 %    Cuff Deflation Trial  GO Tolerated Cuff Deflation:  (no cuff) Behavior: Alert;Controlled;Cooperative        [name] 04/28/2020, 4:51 PM  [name], MS [facility] SLP Acute Rehab Services Office [phone] Pager [phone]

## 2020-04-28 NOTE — Progress Notes (Signed)
When drain sponge replaced under PT trach- skin is red and wearing thin. RT requested that RN place Mepilex cushion or special foam drain sponge. Also, requested RN have Sp02 monitored via telemetry or bedside pulse ox.

## 2020-04-28 NOTE — Progress Notes (Signed)
PT Sp02 now being monitored. PT has Mepilex pad in place under trach flange. PT denies need for trach suctioning at this time.

## 2020-04-28 NOTE — Progress Notes (Signed)
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 Presents with concerns for aspiration pneumonia. Currently plan is continue current care.  Assessment & Plan:   Active Problems:   PNA (pneumonia)   Sepsis ([place])  #1 sepsis secondary to aspiration pneumonia present on admission patient met criteria for sepsis with fever hypotension and hypoxia. Currently on Rocephin and Flagyl. Speech therapy is following.  They recommend dysphagia 3 diet.  Since patient started coughing with dysphagia 3 diet it was changed to full liquid diet only.  #2 AKI improving she received IV fluids initially.  #3 history of laryngeal invasive carcinoma followed by oncology  #4 acute DVT -Doppler was positive for acute DVT in the right leg.  Heparin on hold since CT scan of the head showed evidence of vasogenic edema in the cerebrum.  #5 morbid obesity BMI of 44 risk for poor outcome.  #6 abnormal CT soft tissue neck with potential vasogenic edema on the right posterior cerebral area.  Decadron was added Heparin was stopped.  #7 diarrhea Imodium as needed start probiotics  #8 depression we will start low-dose Lexapro Pressure Injury 04/28/20 Throat Mid;Lower Stage 2 -  Partial thickness loss of dermis presenting as a shallow open injury with a red, pink wound bed without slough. under trach flange (Active)  04/28/20 1000  Location: Throat  Location Orientation: Mid;Lower  Staging: Stage 2 -  Partial thickness loss of dermis presenting as a shallow open injury with a red, pink wound bed without slough.  Wound Description (Comments): under trach flange  Present on Admission: No      Nutrition Problem: Inadequate oral  intake Etiology: cancer and cancer related treatments,dysphagia     Signs/Symptoms:  (s/p PEG for supplemental nutrition/hydration)    Interventions: MVI,Tube feeding  Estimated body mass index is 45.12 kg/m as calculated from the following:   Height as of this encounter: $RemoveBeforeD'5\' 5"'FetFImZBwlSnGG$  (1.651 m).   Weight as of this encounter: 123 kg.  DVT prophylaxis: TED hose Heparin was stopped due to cerebral edema.   Code Status: Partial code Family Communication: None at bedside  disposition Plan:  Status is: Inpatient  Dispo: The patient is from: Home              Anticipated d/c is to: Home              Anticipated d/c date is: 2 days              Patient currently is not medically stable to d/c.   Consultants: None  Procedures: None Antimicrobials- Anti-infectives (From admission, onward)   Start     Dose/Rate Route Frequency Ordered Stop   04/24/20 2200  metroNIDAZOLE (FLAGYL) tablet 500 mg        500 mg Per Tube Every 8 hours 04/24/20 1823     04/24/20 1830  azithromycin (ZITHROMAX) 500 mg in sodium chloride 0.9 % 250 mL IVPB  Status:  Discontinued        500 mg 250 mL/hr over 60 Minutes Intravenous Every 24 hours 04/24/20 1819 04/24/20 1822   04/24/20 1830  cefTRIAXone (ROCEPHIN) 2 g in sodium chloride 0.9 % 100 mL IVPB        2 g 200 mL/hr over 30  Minutes Intravenous Every 24 hours 04/24/20 1822     04/24/20 1515  ciprofloxacin (CIPRO) IVPB 400 mg        400 mg 200 mL/hr over 60 Minutes Intravenous  Once 04/24/20 1501 04/24/20 1717   04/24/20 1515  metroNIDAZOLE (FLAGYL) IVPB 500 mg        500 mg 100 mL/hr over 60 Minutes Intravenous  Once 04/24/20 1501 04/24/20 1717       Subjective:  Resting in bed with eyes closed.  Speech therapy trying PMV valve today.  Staff concerned she is depressed.  Complaining of diarrhea. Objective: Vitals:   04/28/20 0800 04/28/20 1100 04/28/20 1223 04/28/20 1526  BP:   (!) 146/70   Pulse:  86 (!) 58 61  Resp:  $Remo'17 17 17  'vbkMP$ Temp:   98.2 F  (36.8 C)   TempSrc:      SpO2: 93% 92% 93% 92%  Weight:      Height:        Intake/Output Summary (Last 24 hours) at 04/28/2020 1616 Last data filed at 04/28/2020 1000 Gross per 24 hour  Intake 1845.8 ml  Output --  Net 1845.8 ml   Filed Weights   04/24/20 1448 04/27/20 0543 04/28/20 0500  Weight: 115.9 kg 120.4 kg 123 kg    Examination: Patient resting in bed trach and PEG tube in place General exam: Appears calm and comfortable  Respiratory system: Bilateral rhonchi and wheezing to auscultation. Respiratory effort normal. Cardiovascular system: S1 & S2 heard, RRR. No JVD, murmurs, rubs, gallops or clicks. No pedal edema. Gastrointestinal system: Abdomen is nondistended, soft and nontender. No organomegaly or masses felt. Normal bowel sounds heard.  PEG tube in place Central nervous system: Alert and oriented. No focal neurological deficits. Extremities: 1+ pitting edema Skin: No rashes, lesions or ulcers Psychiatry: Judgement and insight appear normal. Mood & affect appropriate.     Data Reviewed: I have personally reviewed following labs and imaging studies  CBC: Recent Labs  Lab 04/24/20 1534 04/25/20 0523 04/25/20 0810 04/26/20 0426 04/27/20 1348 04/28/20 0337  WBC 8.5 4.7 4.8 4.4 5.0 7.0  NEUTROABS 7.1  --  3.5 3.0  --   --   HGB 10.2* 9.0* 9.5* 9.2* 9.7* 9.3*  HCT 32.9* 27.9* 29.8* 29.2* 30.3* 29.1*  MCV 99.7 99.3 99.3 100.0 99.0 99.3  PLT 187 141* 137* 127* 151 403*   Basic Metabolic Panel: Recent Labs  Lab 04/25/20 0523 04/25/20 0810 04/26/20 0426 04/27/20 0415 04/28/20 0337  NA 135 135 136 137 136  K 4.4 4.6 4.5 4.2 4.7  CL 99 99 101 102 101  CO2 $Re'27 28 28 24 27  'vDF$ GLUCOSE 85 88 93 92 165*  BUN 23* 24* 22* 18 15  CREATININE 1.62* 1.75* 1.56* 1.28* 1.25*  CALCIUM 8.1* 8.3* 8.0* 7.7* 8.4*  MG  --  1.9 1.7 1.9 2.0   GFR: Estimated Creatinine Clearance: 63 mL/min (A) (by C-G formula based on SCr of 1.25 mg/dL (H)). Liver Function  Tests: Recent Labs  Lab 04/24/20 1534 04/25/20 0810 04/26/20 0426  AST 13* 12* 12*  ALT $Re'11 10 9  'Wyl$ ALKPHOS 42 38 37*  BILITOT 0.9 0.8 0.6  PROT 6.3* 5.7* 5.3*  ALBUMIN 3.0* 2.7* 2.5*   No results for input(s): LIPASE, AMYLASE in the last 168 hours. No results for input(s): AMMONIA in the last 168 hours. Coagulation Profile: Recent Labs  Lab 04/24/20 1534  INR 1.2   Cardiac Enzymes: No results for input(s): CKTOTAL, CKMB, CKMBINDEX,  TROPONINI in the last 168 hours. BNP (last 3 results) No results for input(s): PROBNP in the last 8760 hours. HbA1C: No results for input(s): HGBA1C in the last 72 hours. CBG: Recent Labs  Lab 04/28/20 0001 04/28/20 0432 04/28/20 0731 04/28/20 1219 04/28/20 1614  GLUCAP 105* 148* 168* 137* 162*   Lipid Profile: No results for input(s): CHOL, HDL, LDLCALC, TRIG, CHOLHDL, LDLDIRECT in the last 72 hours. Thyroid Function Tests: No results for input(s): TSH, T4TOTAL, FREET4, T3FREE, THYROIDAB in the last 72 hours. Anemia Panel: No results for input(s): VITAMINB12, FOLATE, FERRITIN, TIBC, IRON, RETICCTPCT in the last 72 hours. Sepsis Labs: Recent Labs  Lab 04/24/20 1535 04/24/20 1722 04/25/20 0810 04/26/20 0426 04/27/20 0415  PROCALCITON  --   --   --  <0.10 <0.10  LATICACIDVEN 0.8 0.8 0.8 0.9  --     Recent Results (from the past 240 hour(s))  Blood Culture (routine x 2)     Status: None (Preliminary result)   Collection Time: 04/24/20  3:34 PM   Specimen: Left Antecubital; Blood  Result Value Ref Range Status   Specimen Description LEFT ANTECUBITAL  Final   Special Requests   Final    BOTTLES DRAWN AEROBIC AND ANAEROBIC Blood Culture adequate volume   Culture   Final    NO GROWTH 3 DAYS Performed at [facility], [address], [place], [place] [address]    Report Status PENDING  Incomplete  Blood Culture (routine x 2)     Status: None (Preliminary result)   Collection Time: 04/24/20  3:34 PM   Specimen: Right Antecubital;  Blood  Result Value Ref Range Status   Specimen Description RIGHT ANTECUBITAL  Final   Special Requests   Final    BOTTLES DRAWN AEROBIC ONLY Blood Culture results may not be optimal due to an inadequate volume of blood received in culture bottles   Culture   Final    NO GROWTH 3 DAYS Performed at [facility], [address] [place], [place] [address]    Report Status PENDING  Incomplete  Resp Panel by RT-PCR (Flu A&B, Covid) Nasopharyngeal Swab     Status: None   Collection Time: 04/24/20  3:35 PM   Specimen: Nasopharyngeal Swab; Nasopharyngeal(NP) swabs in vial transport medium  Result Value Ref Range Status   SARS Coronavirus 2 by RT PCR NEGATIVE NEGATIVE Final    Comment: (NOTE) SARS-CoV-2 target nucleic acids are NOT DETECTED.  The SARS-CoV-2 RNA is generally detectable in upper respiratory specimens during the acute phase of infection. The lowest concentration of SARS-CoV-2 viral copies this assay can detect is 138 copies/mL. A negative result does not preclude SARS-Cov-2 infection and should not be used as the sole basis for treatment or other patient management decisions. A negative result may occur with  improper specimen collection/handling, submission of specimen other than nasopharyngeal swab, presence of viral mutation(s) within the areas targeted by this assay, and inadequate number of viral copies(<138 copies/mL). A negative result must be combined with clinical observations, patient history, and epidemiological information. The expected result is Negative.  Fact Sheet for Patients:  EntrepreneurPulse.com.au  Fact Sheet for Healthcare Providers:  IncredibleEmployment.be  This test is no t yet approved or cleared by the [place] FDA and  has been authorized for detection and/or diagnosis of SARS-CoV-2 by FDA under an Emergency Use Authorization (EUA). This EUA will remain  in effect (meaning this test can be used) for the  duration of the COVID-19 declaration under Section 564(b)(1)  of the Act, 21 U.S.C.section 360bbb-3(b)(1), unless the authorization is terminated  or revoked sooner.       Influenza A by PCR NEGATIVE NEGATIVE Final   Influenza B by PCR NEGATIVE NEGATIVE Final    Comment: (NOTE) The Xpert Xpress SARS-CoV-2/FLU/RSV plus assay is intended as an aid in the diagnosis of influenza from Nasopharyngeal swab specimens and should not be used as a sole basis for treatment. Nasal washings and aspirates are unacceptable for Xpert Xpress SARS-CoV-2/FLU/RSV testing.  Fact Sheet for Patients: EntrepreneurPulse.com.au  Fact Sheet for Healthcare Providers: IncredibleEmployment.be  This test is not yet approved or cleared by the [place] FDA and has been authorized for detection and/or diagnosis of SARS-CoV-2 by FDA under an Emergency Use Authorization (EUA). This EUA will remain in effect (meaning this test can be used) for the duration of the COVID-19 declaration under Section 564(b)(1) of the Act, 21 U.S.C. section 360bbb-3(b)(1), unless the authorization is terminated or revoked.  Performed at [facility], [name], [place], [place] [address]   Culture, respiratory (non-expectorated)     Status: None   Collection Time: 04/24/20 10:20 PM   Specimen: Tracheal Aspirate; Respiratory  Result Value Ref Range Status   Specimen Description   Final    TRACHEAL ASPIRATE Performed at [facility] [address] [place], [place] [address]    Special Requests   Final    NONE Performed at [facility], [place] [address] [place], [place] [address]    Gram Stain   Final    ABUNDANT WBC PRESENT, PREDOMINANTLY PMN ABUNDANT GRAM POSITIVE COCCI ABUNDANT GRAM POSITIVE RODS    Culture   Final    FEW Normal respiratory flora-no Staph aureus or Pseudomonas seen Performed at [facility] [address],  [place], [place] [address]    Report Status 04/27/2020 FINAL  Final         Radiology Studies: CT HEAD WO CONTRAST  Result Date: 04/27/2020 CLINICAL DATA:  Cerebral edema on heparin drip. EXAM: CT HEAD WITHOUT CONTRAST TECHNIQUE: Contiguous axial images were obtained from the base of the skull through the vertex without intravenous contrast. COMPARISON:  No head CT for comparison FINDINGS: Brain: No midline shift. Signs of vasogenic edema with mass along the high RIGHT parietal region which may be extra-axial measuring approximately 3.7 x 3.0 x 2.3 cm. Extensive vasogenic edema in the RIGHT occipital and parietal lobe with mild mass effect upon the posterior horn of the lateral ventricle but without signs of hemorrhage. Mass appears to have areas of calcification. No hydrocephalus or abnormal extra-axial fluid. Vascular: No hyperdense vessel or unexpected calcification. Skull: Normal. Negative for fracture or focal lesion. Sinuses/Orbits: Air-fluid level in the LEFT sphenoid sinus. Mild mucosal thickening of LEFT sphenoid. Orbits are unremarkable. Other: None. IMPRESSION: 1. Extensive vasogenic edema in the RIGHT parietal/occipital region associated with what is likely an extra-axial mass with calcification. Meningioma is favored. Metastatic disease while considered is felt less likely. MRI with and without contrast is suggested for further assessment. 2. Mild mass effect upon the RIGHT lateral ventricle, posterior horn, no signs of midline shift. 3. No signs of intracranial hemorrhage or abnormal extra-axial fluid. Electronically Signed   By: Zetta Bills M.D.   On: 04/27/2020 19:56   CT SOFT TISSUE NECK WO CONTRAST  Result Date: 04/27/2020 CLINICAL DATA:  Obstructive dysphagia, laryngeal squamous cell carcinoma post tracheostomy EXAM: CT NECK WITHOUT CONTRAST TECHNIQUE: Multidetector CT imaging of the neck was performed  following the standard protocol without intravenous contrast. COMPARISON:   03/11/2020 FINDINGS: Pharynx and larynx: Abnormal soft tissue thickening involving the supraglottic larynx and hypopharynx as seen previously with paraglottic extension. True cords may be spared but submucosal involvement is not excluded. Cartilage involvement as previously described. Measurement is difficult but there has been a decrease in size with improved visualization of the airway. Salivary glands: Unremarkable. Thyroid: Unremarkable. Lymph nodes: Suboptimal evaluation in the absence of intravenous contrast. Abnormal left level 3 node (axial series 2, image 78) is similar in size. Left supraclavicular node on image 89 has increased in size measuring 1.5 x 0.9 cm (previously 1.2 x 0.7 cm). Vascular: No significant abnormality on this noncontrast study. Limited intracranial: Hypoattenuation in the right posterior cerebral white matter. There is mild mass effect. Partially imaged focus of mineralization at the superior margin of this region. Visualized orbits: Unremarkable. Mastoids and visualized paranasal sinuses: Mild paranasal sinus mucosal thickening. Minimal right mastoid opacification. Skeleton: No acute osseous abnormality. Upper chest: Refer to dedicated chest imaging. Other: None. IMPRESSION: Similar extent but decreased bulkiness of supraglottic tumor with improved visualization of airway. Similar size of enlarged left level 3 node. Increase in size of left supraclavicular node. Hypoattenuation in the right posterior cerebral white matter. This was not imaged on the prior study and may reflect vasogenic edema. MRI with contrast recommended. These results will be called to the ordering clinician or representative by the Radiologist Assistant, and communication documented in the PACS or [employer]. Electronically Signed   By: Macy Mis M.D.   On: 04/27/2020 15:22   CT CHEST WO CONTRAST  Result Date: 04/27/2020 CLINICAL DATA:  Pneumonia. EXAM: CT CHEST WITHOUT CONTRAST TECHNIQUE:  Multidetector CT imaging of the chest was performed following the standard protocol without IV contrast. COMPARISON:  April 13, 2020. FINDINGS: Cardiovascular: No evidence of thoracic aortic aneurysm. Mild cardiomegaly is noted. No pericardial effusion. Mediastinum/Nodes: Tracheostomy tube is in good position. The esophagus is unremarkable. No significant adenopathy is noted. Thyroid gland is unremarkable. Lungs/Pleura: No pneumothorax is noted. Small bilateral pleural effusions are noted with adjacent subsegmental atelectasis or infiltrates. Increased patchy opacities are noted in both upper lobes concerning for multifocal pneumonia. Upper Abdomen: No acute abnormality. Musculoskeletal: No chest wall mass or suspicious bone lesions identified. IMPRESSION: 1. Small bilateral pleural effusions are noted with adjacent subsegmental atelectasis or infiltrates. 2. Increased patchy opacities are noted in both upper lobes concerning for multifocal pneumonia. 3. Tracheostomy tube is in good position. Electronically Signed   By: Marijo Conception M.D.   On: 04/27/2020 15:10        Scheduled Meds: . arformoterol  15 mcg Nebulization BID  . benzonatate  100 mg Oral TID  . budesonide  0.5 mg Nebulization BID  . dexamethasone (DECADRON) injection  4 mg Intravenous Q12H  . feeding supplement (OSMOLITE 1.5 CAL)  237 mL Per Tube 6 X Daily  . feeding supplement (PROSource TF)  45 mL Per Tube BID  . gabapentin  300 mg Per Tube QHS  . guaiFENesin-dextromethorphan  5 mL Per Tube QID  . ipratropium-albuterol  3 mL Nebulization BID  . ketotifen  1 drop Both Eyes BID  . lidocaine  1 patch Transdermal Q24H  . metroNIDAZOLE  500 mg Per Tube Q8H  . midodrine  5 mg Oral BID WC  . multivitamin  15 mL Per Tube Daily  . neomycin-bacitracin-polymyxin   Topical BID  . nystatin  5 mL Mouth/Throat QID  . pantoprazole sodium  40 mg Per Tube Daily  . saccharomyces boulardii  250 mg Per Tube BID   Continuous Infusions: .  cefTRIAXone (ROCEPHIN)  IV Stopped (04/27/20 1805)     LOS: 4 days     Georgette Shell, MD  04/28/2020, 4:16 PM

## 2020-04-28 NOTE — Progress Notes (Signed)
PT Cancellation Note  Patient Details Name: Veronica Horton MRN: [id number] DOB: 1961-01-29   Cancelled Treatment:    Reason Eval/Treat Not Completed: Patient at procedure or test/unavailable--working with Speech Therapy. Will check back on tomorrow.    [facility]  Office: [phone] Pager: [phone]

## 2020-04-29 ENCOUNTER — Inpatient Hospital Stay (HOSPITAL_COMMUNITY): Payer: Medicaid Other

## 2020-04-29 ENCOUNTER — Ambulatory Visit
Admit: 2020-04-29 | Discharge: 2020-04-29 | Disposition: A | Discharge status: Home / Self Care | Payer: Medicaid Other | Attending: Radiation Oncology | Admitting: Radiation Oncology

## 2020-04-29 DIAGNOSIS — R0602 Shortness of breath: Secondary | ICD-10-CM

## 2020-04-29 LAB — BASIC METABOLIC PANEL
Anion gap: 8 (ref 5–15)
BUN: 22 mg/dL — ABNORMAL HIGH (ref 6–20)
CO2: 27 mmol/L (ref 22–32)
Calcium: 8.8 mg/dL — ABNORMAL LOW (ref 8.9–10.3)
Chloride: 101 mmol/L (ref 98–111)
Creatinine, Ser: 1.14 mg/dL — ABNORMAL HIGH (ref 0.44–1.00)
GFR, Estimated: 55 mL/min — ABNORMAL LOW (ref >60)
Glucose, Bld: 143 mg/dL — ABNORMAL HIGH (ref 70–99)
Potassium: 5.5 mmol/L — ABNORMAL HIGH (ref 3.5–5.1)
Sodium: 136 mmol/L (ref 135–145)

## 2020-04-29 LAB — CULTURE, BLOOD (ROUTINE X 2)
Culture: NO GROWTH
Culture: NO GROWTH
Special Requests: ADEQUATE

## 2020-04-29 LAB — MAGNESIUM: Magnesium: 2.1 mg/dL (ref 1.7–2.4)

## 2020-04-29 LAB — CBC
HCT: 30.4 % — ABNORMAL LOW (ref 36.0–46.0)
Hemoglobin: 9.7 g/dL — ABNORMAL LOW (ref 12.0–15.0)
MCH: 31.5 pg (ref 26.0–34.0)
MCHC: 31.9 g/dL (ref 30.0–36.0)
MCV: 98.7 fL (ref 80.0–100.0)
Platelets: 130 10*3/uL — ABNORMAL LOW (ref 150–400)
RBC: 3.08 MIL/uL — ABNORMAL LOW (ref 3.87–5.11)
RDW: 16.3 % — ABNORMAL HIGH (ref 11.5–15.5)
WBC: 7.4 10*3/uL (ref 4.0–10.5)
nRBC: 0 % (ref 0.0–0.2)

## 2020-04-29 LAB — GLUCOSE, CAPILLARY
Glucose-Capillary: 123 mg/dL — ABNORMAL HIGH (ref 70–99)
Glucose-Capillary: 126 mg/dL — ABNORMAL HIGH (ref 70–99)
Glucose-Capillary: 140 mg/dL — ABNORMAL HIGH (ref 70–99)
Glucose-Capillary: 151 mg/dL — ABNORMAL HIGH (ref 70–99)
Glucose-Capillary: 164 mg/dL — ABNORMAL HIGH (ref 70–99)
Glucose-Capillary: 165 mg/dL — ABNORMAL HIGH (ref 70–99)

## 2020-04-29 MED ORDER — COVID-19 MRNA VACC (MODERNA) 100 MCG/0.5ML IM SUSP: 0.5000 mL | Freq: Once | INTRAMUSCULAR | Status: DC

## 2020-04-29 MED ORDER — LORAZEPAM 2 MG/ML IJ SOLN: 1.0000 mg | Freq: Once | INTRAMUSCULAR | Status: DC | PRN

## 2020-04-29 NOTE — Progress Notes (Signed)
Placed PT on new ATC- uneventful.

## 2020-04-29 NOTE — Progress Notes (Signed)
MRI staff went to pick her up for the MRI Brain wo/w exam. Patient adamantly refused MRI. She stated she has tried MRI's before and she cannot tolerate the exam. Spoke with Lesly Rubenstein RN and sent message to Dr Zigmund Daniel.

## 2020-04-29 NOTE — TOC Progression Note (Addendum)
Transition of Care [facility]) - Progression Note    Patient Details  Name: KATIANNE BARRE MRN: [id number] Date of Birth: 1960-05-17  Transition of Care [facility]) CM/SW Contact  Channel Papandrea, [name], RN Phone Number: 04/29/2020, 1:54 PM  Clinical Narrative: PT recc HHPT-[facility] unable to accept for charity-spoke to [facility] to otpt PT;also informed [name] to contact our first source that assts with medicaid applic-voiced understanding. Informed to contact floor nurse to set up teaching for [facility] care.   2:14p-Patient will go to ptpt PT closer to her home in [place] prefers a manual script for otpt PT-MD notified.    Expected Discharge Plan: Home/Self Care Barriers to Discharge:  (No [place] agency to accept charity for HHPT.will set up otpt PT)  Expected Discharge Plan and Services Expected Discharge Plan: Home/Self Care   Discharge Planning Services: CM Consult Post Acute Care Choice: Durable Medical Equipment ([facility]-PEG/Trach supplies/oxygen) Living arrangements for the past 2 months: Single Family Home                                       Social Determinants of Health (SDOH) Interventions    Readmission Risk Interventions Readmission Risk Prevention Plan 04/26/2020 01/20/2019  Medication Screening - Complete  Transportation Screening Complete Complete  Medication Review ([place]) Complete -  PCP or Specialist appointment within 3-5 days of discharge Complete -  [place] or Home Care Consult Complete -  SW Recovery Care/Counseling Consult Complete -  [place] Patient Refused -  Some recent data might be hidden

## 2020-04-29 NOTE — Progress Notes (Signed)
PT denies need for suctioning at this time. 

## 2020-04-29 NOTE — Progress Notes (Signed)
Patient's CBG=164, per order set on-call provider [facility] notified. Awaiting any new orders.

## 2020-04-29 NOTE — Progress Notes (Signed)
  Speech Language Pathology Treatment: Nada Boozer Speaking valve  Patient Details Name: Veronica Horton MRN: [id number] DOB: 03-23-1961 Today's Date: 04/29/2020 Time: 6962-9528 SLP Time Calculation (min) (ACUTE ONLY): 18 min  Assessment / Plan / Recommendation Clinical Impression  Pt seen to assess tolerance of PMSV = Upon walking into the room, pt had her PMSV in place.  Per RN, pt is donning and doffing her PMSV herself.   SLP was concerned with this plan given trach and upon attempt to remove PMSV, valv was on too tight causing difficulty in removing.  SLP demonstrated donning/removal to pt and pt able to adequately secure trach and place valve gently.    Reviewed importance of taking PMSV off when sleeping or receiving breathing treatment and to assure adequacy of secretion clearance prior to placement.    Pt noted to drink water - with mouth full of jello.  Audible swallow noted and no immediate indication of airway compromise.  Delayed cough noted after SLP was manipulating trach to remove valve.  Pt's face was red with significant coughing and secretion expectoration. Pt stated "That jello".  Suspect pt's cough was due to manipulation of trach with PMSV - pt agreed.   Recommend continue PMSV as tolerated and po diet.  Will follow for dysphagia/PMSV management. Pt spoke to her sister on the phone today and is happy with her PMSV usage.      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  Prior medical history includes smoking and some dyspnea if walking long distanced.  She has been on a dys3/thin diet and follow up indicated to provide information including exercises etc for potential side effects of XRT/chemo.  Pt has been maintained on a dys3/thin diet and tube feeding. Readmitted with pna - concern for aspiration.   Pt has a trach and its able to move air around it, thus PMSV adivsed to be tried.   PMSV placed on 04/28/2020 and follow up indicated to assess tolerance.      SLP Plan  Continue with current plan of care       Recommendations         Patient may use Passy-Muir Speech Valve: During all waking hours (remove during sleep);Caregiver trained to provide supervision;During PO intake/meals PMSV Supervision: Intermittent         General recommendations: Other(comment) Oral Care Recommendations: Oral care QID Follow up Recommendations: None SLP Visit Diagnosis: Aphonia (R49.1) Plan: Continue with current plan of care       GO                [name] 04/29/2020, 2:06 PM   [name], MS [facility] SLP Acute Rehab Services Office [phone] Pager [phone]

## 2020-04-29 NOTE — Evaluation (Signed)
Physical Therapy Evaluation Patient Details Name: Veronica Horton MRN: [id number] DOB: Nov 24, 1960 Today's Date: 04/29/2020   History of Present Illness  Veronica Horton is a 59 year old female with medical history significant for COPD with chronic hypoxic respiratory failure on 5 L, HTN, tobacco use, obesity class 3 who presented on 11/10 with worsening shortness of breath and increased confusion. Pt found to be in COPD exacerbation, hospital course at [facility] complicated by dysphasia, pt found to have a laryngeal mass. Pt transferred to [facility]. Pt underwent trach placement on 11/13 s/p layrngeal mass biopsy. 11/22 G-tube. DC 04/16/20. Pt now admitted 04/24/20 with aspiration PNA.  Clinical Impression  The patient is eager is get up. Patient assisted with hygiene post incontinent BM. Patient able to get up and  trnasfer to [facility] and recliner. Did not feel up to ambulating today.Patient planning to return home.  Patient used PMV, tolerated well.  Pt admitted with above diagnosis.   Pt currently with functional limitations due to the deficits listed below (see PT Problem List). Pt will benefit from skilled PT to increase their independence and safety with mobility to allow discharge to the venue listed below.        Follow Up Recommendations Home health PT;Supervision - Intermittent    Equipment Recommendations  None recommended by PT    Recommendations for Other Services       Precautions / Restrictions Precautions Precautions: Fall Precaution Comments: trach collar, G-tube,      Mobility  Bed Mobility   Bed Mobility: Supine to Sit       Sit to supine: HOB elevated;Min guard   General bed mobility comments: Min guard for transfer to side of bed and use of bed rail.    Transfers Overall transfer level: Needs assistance   Transfers: Sit to/from Stand;Stand Pivot Transfers Sit to Stand: Min guard Stand pivot transfers: Min guard       General transfer comment: pivot transfer  to [place] then to recliner, did not use RW.  Ambulation/Gait                Stairs            Wheelchair Mobility    Modified Rankin (Stroke Patients Only)       Balance Overall balance assessment: Needs assistance Sitting-balance support: No upper extremity supported;Feet supported Sitting balance-Leahy Scale: Good     Standing balance support: No upper extremity supported;Single extremity supported Standing balance-Leahy Scale: Fair Standing balance comment: /reliant on UEs for pericare while standing                             Pertinent Vitals/Pain Faces Pain Scale: Hurts a little bit Pain Location: [place] Pain Descriptors / Indicators: Grimacing Pain Intervention(s): Monitored during session    Home Living Family/patient expects to be discharged to:: Private residence Living Arrangements: Other relatives Available Help at Discharge: Friend(s);Available 24 hours/day;Family Type of Home: Mobile home Home Access: Ramped entrance     Home Layout: One level Home Equipment: [place] - 2 wheels;Cane - single point      Prior Function Level of Independence: Needs assistance   Gait / Transfers Assistance Needed: Reports using RW since being home. Reports she can walk to the kitchen.  ADL's / Homemaking Assistance Needed: Has been able to perform ADLs. Reports predominantly wearing night gowns for comfort. Hasn't been able to get a shower chair.  Comments: used cane and RW to aide  in amb, pt doesn't work     Journalist, newspaper   Dominant Hand: Right    Extremity/Trunk Assessment   Upper Extremity Assessment Upper Extremity Assessment: Overall WFL for tasks assessed    Lower Extremity Assessment Lower Extremity Assessment: Generalized weakness    Cervical / Trunk Assessment Cervical / Trunk Assessment: Normal  Communication   Communication: Tracheostomy  Cognition Arousal/Alertness: Awake/alert Behavior During Therapy: WFL for tasks  assessed/performed Overall Cognitive Status: Within Functional Limits for tasks assessed                                        General Comments      Exercises     Assessment/Plan    PT Assessment Patient needs continued PT services  PT Problem List Decreased strength;Decreased activity tolerance;Decreased balance;Decreased mobility       PT Treatment Interventions DME instruction;Gait training;Stair training;Functional mobility training;Therapeutic activities;Therapeutic exercise;Patient/family education;Balance training    PT Goals (Current goals can be found in the Care Plan section)  Acute Rehab PT Goals Patient Stated Goal: to get back home PT Goal Formulation: With patient Time For Goal Achievement: 05/13/20 Potential to Achieve Goals: Good    Frequency Min 3X/week   Barriers to discharge        Co-evaluation               AM-PAC PT "6 Clicks" Mobility  Outcome Measure Help needed turning from your back to your side while in a flat bed without using bedrails?: None Help needed moving from lying on your back to sitting on the side of a flat bed without using bedrails?: None Help needed moving to and from a bed to a chair (including a wheelchair)?: A Little Help needed standing up from a chair using your arms (e.g., wheelchair or bedside chair)?: A Little Help needed to walk in hospital room?: A Little Help needed climbing 3-5 steps with a railing? : A Lot 6 Click Score: 19    End of Session Equipment Utilized During Treatment: Gait belt;Oxygen Activity Tolerance: Patient tolerated treatment well Patient left: in chair;with call bell/phone within reach;with chair alarm set Nurse Communication: Mobility status PT Visit Diagnosis: Muscle weakness (generalized) (M62.81);Difficulty in walking, not elsewhere classified (R26.2)    Time: 1610-9604 PT Time Calculation (min) (ACUTE ONLY): 27 min   Charges:   PT Evaluation $PT Eval Low  Complexity: 1 Low PT Treatments $Therapeutic Activity: 8-22 mins        Tresa Endo PT Acute Rehabilitation Services Pager [phone] Office [phone]   Claretha Cooper 04/29/2020, 1:05 PM

## 2020-04-29 NOTE — Progress Notes (Signed)
PT denies need for trach suctioning at this time. 

## 2020-04-29 NOTE — Progress Notes (Signed)
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 Presents with concerns for aspiration pneumonia.  Assessment & Plan:   Active Problems:   PNA (pneumonia)   Sepsis (HCC)  #1 sepsis secondary to aspiration pneumonia present on admission patient met criteria for sepsis with fever hypotension and hypoxia. Currently on Rocephin and Flagyl. Speech therapy is following.  They recommend dysphagia 3 diet.  Since patient started coughing with dysphagia 3 diet it was changed to full liquid diet only. Patient was tried on PMV yesterday and she did well.  #2 AKI improving she received IV fluids initially.  Creatinine is 1.14 from 1.56.  #3 history of laryngeal invasive carcinoma followed by oncology dr kale  #4 acute DVT -Doppler was positive for acute popliteal  DVT in the right leg.  Heparin on hold since CT scan of the head showed evidence of vasogenic edema in the cerebrum? MRI brain with and without to r/o mets  May need IVC filter.  #5 morbid obesity BMI of 44 risk for poor outcome.  #6 abnormal CT soft tissue neck with potential vasogenic edema on the right posterior cerebral area.  Decadron was added Heparin was stopped.  #7 diarrhea likely antibiotics induced.start florastor. Imodium as needed start probiotics  #8 depression we will start low-dose Lexapro  Pressure Injury 04/28/20 Throat Mid;Lower Stage 2 -  Partial thickness loss of dermis presenting as a shallow open injury with a red, pink wound bed without slough. under trach flange (Active)  04/28/20 1000  Location: Throat  Location Orientation: Mid;Lower  Staging: Stage 2 -  Partial thickness loss of dermis presenting as a shallow open  injury with a red, pink wound bed without slough.  Wound Description (Comments): under trach flange  Present on Admission: No      Nutrition Problem: Inadequate oral intake Etiology: cancer and cancer related treatments,dysphagia     Signs/Symptoms:  (s/p PEG for supplemental nutrition/hydration)    Interventions: MVI,Tube feeding  Estimated body mass index is 44.54 kg/m as calculated from the following:   Height as of this encounter: 5\' 5"  (1.651 m).   Weight as of this encounter: 121.4 kg.  DVT prophylaxis: TED hose Heparin was stopped due to cerebral edema.   Code Status: Partial code Family Communication dw sister  disposition Plan:  Status is: Inpatient  Dispo: The patient is from: Home              Anticipated d/c is to: Home              Anticipated d/c date is: 2 days              Patient currently is not medically stable to d/c.   Consultants: None  Procedures: None Antimicrobials- Anti-infectives (From admission, onward)   Start     Dose/Rate Route Frequency Ordered Stop   04/24/20 2200  metroNIDAZOLE (FLAGYL) tablet 500 mg        500 mg Per Tube Every 8 hours 04/24/20 1823     04/24/20 1830  azithromycin (ZITHROMAX) 500 mg in sodium chloride 0.9 % 250 mL IVPB  Status:  Discontinued        500 mg 250 mL/hr over 60 Minutes Intravenous Every 24 hours 04/24/20 1819 04/24/20  1822   04/24/20 1830  cefTRIAXone (ROCEPHIN) 2 g in sodium chloride 0.9 % 100 mL IVPB        2 g 200 mL/hr over 30 Minutes Intravenous Every 24 hours 04/24/20 1822     04/24/20 1515  ciprofloxacin (CIPRO) IVPB 400 mg        400 mg 200 mL/hr over 60 Minutes Intravenous  Once 04/24/20 1501 04/24/20 1717   04/24/20 1515  metroNIDAZOLE (FLAGYL) IVPB 500 mg        500 mg 100 mL/hr over 60 Minutes Intravenous  Once 04/24/20 1501 04/24/20 1717       Subjective: She is resting in bed in nad no events overnite diarhea better   Objective: Vitals:   04/28/20 2300 04/29/20 0339 04/29/20  0629 04/29/20 0757  BP:   (!) 117/56   Pulse: 78 (!) 55 60   Resp:   18   Temp:   98.6 F (37 C)   TempSrc:   Oral   SpO2: 93% 95% 93% 95%  Weight:   121.4 kg   Height:        Intake/Output Summary (Last 24 hours) at 04/29/2020 1031 Last data filed at 04/29/2020 0600 Gross per 24 hour  Intake 850 ml  Output 500 ml  Net 350 ml   Filed Weights   04/27/20 0543 04/28/20 0500 04/29/20 0629  Weight: 120.4 kg 123 kg 121.4 kg    Examination: Patient resting in bed trach and PEG tube in place General exam: Appears calm and comfortable  Respiratory system: Bilateral rhonchi and wheezing to auscultation. Respiratory effort normal. Cardiovascular system: S1 & S2 heard, RRR. No JVD, murmurs, rubs, gallops or clicks. No pedal edema. Gastrointestinal system: Abdomen is nondistended, soft and nontender. No organomegaly or masses felt. Normal bowel sounds heard.  PEG tube in place Central nervous system: Alert and oriented. No focal neurological deficits. Extremities: 1+ pitting edema Skin: No rashes, lesions or ulcers Psychiatry: Judgement and insight appear normal. Mood & affect appropriate.     Data Reviewed: I have personally reviewed following labs and imaging studies  CBC: Recent Labs  Lab 04/24/20 1534 04/25/20 0523 04/25/20 0810 04/26/20 0426 04/27/20 1348 04/28/20 0337 04/29/20 0530  WBC 8.5   < > 4.8 4.4 5.0 7.0 7.4  NEUTROABS 7.1  --  3.5 3.0  --   --   --   HGB 10.2*   < > 9.5* 9.2* 9.7* 9.3* 9.7*  HCT 32.9*   < > 29.8* 29.2* 30.3* 29.1* 30.4*  MCV 99.7   < > 99.3 100.0 99.0 99.3 98.7  PLT 187   < > 137* 127* 151 141* 130*   < > = values in this interval not displayed.   Basic Metabolic Panel: Recent Labs  Lab 04/25/20 0810 04/26/20 0426 04/27/20 0415 04/28/20 0337 04/29/20 0530  NA 135 136 137 136 136  K 4.6 4.5 4.2 4.7 5.5*  CL 99 101 102 101 101  CO2 $Re'28 28 24 27 27  'til$ GLUCOSE 88 93 92 165* 143*  BUN 24* 22* 18 15 22*  CREATININE 1.75* 1.56* 1.28*  1.25* 1.14*  CALCIUM 8.3* 8.0* 7.7* 8.4* 8.8*  MG 1.9 1.7 1.9 2.0 2.1   GFR: Estimated Creatinine Clearance: 68.6 mL/min (A) (by C-G formula based on SCr of 1.14 mg/dL (H)). Liver Function Tests: Recent Labs  Lab 04/24/20 1534 04/25/20 0810 04/26/20 0426  AST 13* 12* 12*  ALT $Re'11 10 9  'hqV$ ALKPHOS 42 38 37*  BILITOT 0.9  0.8 0.6  PROT 6.3* 5.7* 5.3*  ALBUMIN 3.0* 2.7* 2.5*   No results for input(s): LIPASE, AMYLASE in the last 168 hours. No results for input(s): AMMONIA in the last 168 hours. Coagulation Profile: Recent Labs  Lab 04/24/20 1534  INR 1.2   Cardiac Enzymes: No results for input(s): CKTOTAL, CKMB, CKMBINDEX, TROPONINI in the last 168 hours. BNP (last 3 results) No results for input(s): PROBNP in the last 8760 hours. HbA1C: No results for input(s): HGBA1C in the last 72 hours. CBG: Recent Labs  Lab 04/28/20 1614 04/28/20 2025 04/29/20 0002 04/29/20 0406 04/29/20 0720  GLUCAP 162* 150* 164* 126* 151*   Lipid Profile: No results for input(s): CHOL, HDL, LDLCALC, TRIG, CHOLHDL, LDLDIRECT in the last 72 hours. Thyroid Function Tests: No results for input(s): TSH, T4TOTAL, FREET4, T3FREE, THYROIDAB in the last 72 hours. Anemia Panel: No results for input(s): VITAMINB12, FOLATE, FERRITIN, TIBC, IRON, RETICCTPCT in the last 72 hours. Sepsis Labs: Recent Labs  Lab 04/24/20 1535 04/24/20 1722 04/25/20 0810 04/26/20 0426 04/27/20 0415  PROCALCITON  --   --   --  <0.10 <0.10  LATICACIDVEN 0.8 0.8 0.8 0.9  --     Recent Results (from the past 240 hour(s))  Blood Culture (routine x 2)     Status: None (Preliminary result)   Collection Time: 04/24/20  3:34 PM   Specimen: Left Antecubital; Blood  Result Value Ref Range Status   Specimen Description LEFT ANTECUBITAL  Final   Special Requests   Final    BOTTLES DRAWN AEROBIC AND ANAEROBIC Blood Culture adequate volume   Culture   Final    NO GROWTH 3 DAYS Performed at [facility], [address],  [place], [place] [address]    Report Status PENDING  Incomplete  Blood Culture (routine x 2)     Status: None (Preliminary result)   Collection Time: 04/24/20  3:34 PM   Specimen: Right Antecubital; Blood  Result Value Ref Range Status   Specimen Description RIGHT ANTECUBITAL  Final   Special Requests   Final    BOTTLES DRAWN AEROBIC ONLY Blood Culture results may not be optimal due to an inadequate volume of blood received in culture bottles   Culture   Final    NO GROWTH 3 DAYS Performed at [facility], [address], [place], [place] [address]    Report Status PENDING  Incomplete  Resp Panel by RT-PCR (Flu A&B, Covid) Nasopharyngeal Swab     Status: None   Collection Time: 04/24/20  3:35 PM   Specimen: Nasopharyngeal Swab; Nasopharyngeal(NP) swabs in vial transport medium  Result Value Ref Range Status   SARS Coronavirus 2 by RT PCR NEGATIVE NEGATIVE Final    Comment: (NOTE) SARS-CoV-2 target nucleic acids are NOT DETECTED.  The SARS-CoV-2 RNA is generally detectable in upper respiratory specimens during the acute phase of infection. The lowest concentration of SARS-CoV-2 viral copies this assay can detect is 138 copies/mL. A negative result does not preclude SARS-Cov-2 infection and should not be used as the sole basis for treatment or other patient management decisions. A negative result may occur with  improper specimen collection/handling, submission of specimen other than nasopharyngeal swab, presence of viral mutation(s) within the areas targeted by this assay, and inadequate number of viral copies(<138 copies/mL). A negative result must be combined with clinical observations, patient history, and epidemiological information. The expected result is Negative.  Fact Sheet for Patients:  EntrepreneurPulse.com.au  Fact Sheet for Healthcare Providers:  IncredibleEmployment.be  This test is no t yet approved or cleared by the [place] and  has been authorized for detection and/or diagnosis of SARS-CoV-2 by FDA under an Emergency Use Authorization (EUA). This EUA will remain  in effect (meaning this test can be used) for the duration of the COVID-19 declaration under Section 564(b)(1) of the Act, 21 U.S.C.section 360bbb-3(b)(1), unless the authorization is terminated  or revoked sooner.       Influenza A by PCR NEGATIVE NEGATIVE Final   Influenza B by PCR NEGATIVE NEGATIVE Final    Comment: (NOTE) The Xpert Xpress SARS-CoV-2/FLU/RSV plus assay is intended as an aid in the diagnosis of influenza from Nasopharyngeal swab specimens and should not be used as a sole basis for treatment. Nasal washings and aspirates are unacceptable for Xpert Xpress SARS-CoV-2/FLU/RSV testing.  Fact Sheet for Patients: EntrepreneurPulse.com.au  Fact Sheet for Healthcare Providers: IncredibleEmployment.be  This test is not yet approved or cleared by the [place] FDA and has been authorized for detection and/or diagnosis of SARS-CoV-2 by FDA under an Emergency Use Authorization (EUA). This EUA will remain in effect (meaning this test can be used) for the duration of the COVID-19 declaration under Section 564(b)(1) of the Act, 21 U.S.C. section 360bbb-3(b)(1), unless the authorization is terminated or revoked.  Performed at [facility], [address] [place], [place] [address]   Culture, respiratory (non-expectorated)     Status: None   Collection Time: 04/24/20 10:20 PM   Specimen: Tracheal Aspirate; Respiratory  Result Value Ref Range Status   Specimen Description   Final    TRACHEAL ASPIRATE Performed at [facility] [address], [place], [place] [address]    Special Requests   Final    NONE Performed at [facility], [place] [address] [place], [place] [address]    Gram Stain   Final    ABUNDANT WBC PRESENT, PREDOMINANTLY PMN ABUNDANT  GRAM POSITIVE COCCI ABUNDANT GRAM POSITIVE RODS    Culture   Final    FEW Normal respiratory flora-no Staph aureus or Pseudomonas seen Performed at [facility] [address], [place], [place] [address]    Report Status 04/27/2020 FINAL  Final         Radiology Studies: CT HEAD WO CONTRAST  Result Date: 04/27/2020 CLINICAL DATA:  Cerebral edema on heparin drip. EXAM: CT HEAD WITHOUT CONTRAST TECHNIQUE: Contiguous axial images were obtained from the base of the skull through the vertex without intravenous contrast. COMPARISON:  No head CT for comparison FINDINGS: Brain: No midline shift. Signs of vasogenic edema with mass along the high RIGHT parietal region which may be extra-axial measuring approximately 3.7 x 3.0 x 2.3 cm. Extensive vasogenic edema in the RIGHT occipital and parietal lobe with mild mass effect upon the posterior horn of the lateral ventricle but without signs of hemorrhage. Mass appears to have areas of calcification. No hydrocephalus or abnormal extra-axial fluid. Vascular: No hyperdense vessel or unexpected calcification. Skull: Normal. Negative for fracture or focal lesion. Sinuses/Orbits: Air-fluid level in the LEFT sphenoid sinus. Mild mucosal thickening of LEFT sphenoid. Orbits are unremarkable. Other: None. IMPRESSION: 1. Extensive vasogenic edema in the RIGHT parietal/occipital region associated with what is likely an extra-axial mass with calcification. Meningioma is favored. Metastatic disease while considered is felt less likely. MRI with and without contrast is suggested for further assessment. 2. Mild mass effect upon the RIGHT lateral ventricle, posterior horn, no signs of midline shift. 3. No signs of  intracranial hemorrhage or abnormal extra-axial fluid. Electronically Signed   By: Zetta Bills M.D.   On: 04/27/2020 19:56   CT SOFT TISSUE NECK WO CONTRAST  Result Date: 04/27/2020 CLINICAL DATA:  Obstructive dysphagia, laryngeal squamous cell  carcinoma post tracheostomy EXAM: CT NECK WITHOUT CONTRAST TECHNIQUE: Multidetector CT imaging of the neck was performed following the standard protocol without intravenous contrast. COMPARISON:  03/11/2020 FINDINGS: Pharynx and larynx: Abnormal soft tissue thickening involving the supraglottic larynx and hypopharynx as seen previously with paraglottic extension. True cords may be spared but submucosal involvement is not excluded. Cartilage involvement as previously described. Measurement is difficult but there has been a decrease in size with improved visualization of the airway. Salivary glands: Unremarkable. Thyroid: Unremarkable. Lymph nodes: Suboptimal evaluation in the absence of intravenous contrast. Abnormal left level 3 node (axial series 2, image 78) is similar in size. Left supraclavicular node on image 89 has increased in size measuring 1.5 x 0.9 cm (previously 1.2 x 0.7 cm). Vascular: No significant abnormality on this noncontrast study. Limited intracranial: Hypoattenuation in the right posterior cerebral white matter. There is mild mass effect. Partially imaged focus of mineralization at the superior margin of this region. Visualized orbits: Unremarkable. Mastoids and visualized paranasal sinuses: Mild paranasal sinus mucosal thickening. Minimal right mastoid opacification. Skeleton: No acute osseous abnormality. Upper chest: Refer to dedicated chest imaging. Other: None. IMPRESSION: Similar extent but decreased bulkiness of supraglottic tumor with improved visualization of airway. Similar size of enlarged left level 3 node. Increase in size of left supraclavicular node. Hypoattenuation in the right posterior cerebral white matter. This was not imaged on the prior study and may reflect vasogenic edema. MRI with contrast recommended. These results will be called to the ordering clinician or representative by the Radiologist Assistant, and communication documented in the PACS or [employer].  Electronically Signed   By: Macy Mis M.D.   On: 04/27/2020 15:22   CT CHEST WO CONTRAST  Result Date: 04/27/2020 CLINICAL DATA:  Pneumonia. EXAM: CT CHEST WITHOUT CONTRAST TECHNIQUE: Multidetector CT imaging of the chest was performed following the standard protocol without IV contrast. COMPARISON:  April 13, 2020. FINDINGS: Cardiovascular: No evidence of thoracic aortic aneurysm. Mild cardiomegaly is noted. No pericardial effusion. Mediastinum/Nodes: Tracheostomy tube is in good position. The esophagus is unremarkable. No significant adenopathy is noted. Thyroid gland is unremarkable. Lungs/Pleura: No pneumothorax is noted. Small bilateral pleural effusions are noted with adjacent subsegmental atelectasis or infiltrates. Increased patchy opacities are noted in both upper lobes concerning for multifocal pneumonia. Upper Abdomen: No acute abnormality. Musculoskeletal: No chest wall mass or suspicious bone lesions identified. IMPRESSION: 1. Small bilateral pleural effusions are noted with adjacent subsegmental atelectasis or infiltrates. 2. Increased patchy opacities are noted in both upper lobes concerning for multifocal pneumonia. 3. Tracheostomy tube is in good position. Electronically Signed   By: Marijo Conception M.D.   On: 04/27/2020 15:10        Scheduled Meds: . arformoterol  15 mcg Nebulization BID  . benzonatate  100 mg Oral TID  . budesonide  0.5 mg Nebulization BID  . dexamethasone (DECADRON) injection  4 mg Intravenous Q12H  . escitalopram  5 mg Oral Daily  . feeding supplement (OSMOLITE 1.5 CAL)  237 mL Per Tube 6 X Daily  . feeding supplement (PROSource TF)  45 mL Per Tube BID  . gabapentin  300 mg Per Tube QHS  . guaiFENesin-dextromethorphan  5 mL Per Tube QID  . ipratropium-albuterol  3 mL  Nebulization BID  . ketotifen  1 drop Both Eyes BID  . lidocaine  1 patch Transdermal Q24H  . metroNIDAZOLE  500 mg Per Tube Q8H  . midodrine  5 mg Oral BID WC  . multivitamin  15  mL Per Tube Daily  . neomycin-bacitracin-polymyxin   Topical BID  . nystatin  5 mL Mouth/Throat QID  . pantoprazole sodium  40 mg Per Tube Daily  . saccharomyces boulardii  250 mg Per Tube BID   Continuous Infusions: . cefTRIAXone (ROCEPHIN)  IV 2 g (04/28/20 1850)     LOS: 5 days     Georgette Shell, MD  04/29/2020, 10:31 AM

## 2020-04-30 LAB — MAGNESIUM: Magnesium: 2.1 mg/dL (ref 1.7–2.4)

## 2020-04-30 LAB — BASIC METABOLIC PANEL
Anion gap: 9 (ref 5–15)
BUN: 32 mg/dL — ABNORMAL HIGH (ref 6–20)
CO2: 26 mmol/L (ref 22–32)
Calcium: 9 mg/dL (ref 8.9–10.3)
Chloride: 103 mmol/L (ref 98–111)
Creatinine, Ser: 1.15 mg/dL — ABNORMAL HIGH (ref 0.44–1.00)
GFR, Estimated: 55 mL/min — ABNORMAL LOW (ref >60)
Glucose, Bld: 120 mg/dL — ABNORMAL HIGH (ref 70–99)
Potassium: 5.8 mmol/L — ABNORMAL HIGH (ref 3.5–5.1)
Sodium: 138 mmol/L (ref 135–145)

## 2020-04-30 LAB — CBC
HCT: 31.4 % — ABNORMAL LOW (ref 36.0–46.0)
Hemoglobin: 9.9 g/dL — ABNORMAL LOW (ref 12.0–15.0)
MCH: 31.5 pg (ref 26.0–34.0)
MCHC: 31.5 g/dL (ref 30.0–36.0)
MCV: 100 fL (ref 80.0–100.0)
Platelets: 121 10*3/uL — ABNORMAL LOW (ref 150–400)
RBC: 3.14 MIL/uL — ABNORMAL LOW (ref 3.87–5.11)
RDW: 16.7 % — ABNORMAL HIGH (ref 11.5–15.5)
WBC: 6.8 10*3/uL (ref 4.0–10.5)
nRBC: 0 % (ref 0.0–0.2)

## 2020-04-30 LAB — GLUCOSE, CAPILLARY
Glucose-Capillary: 116 mg/dL — ABNORMAL HIGH (ref 70–99)
Glucose-Capillary: 156 mg/dL — ABNORMAL HIGH (ref 70–99)
Glucose-Capillary: 165 mg/dL — ABNORMAL HIGH (ref 70–99)
Glucose-Capillary: 167 mg/dL — ABNORMAL HIGH (ref 70–99)
Glucose-Capillary: 172 mg/dL — ABNORMAL HIGH (ref 70–99)

## 2020-04-30 MED ORDER — LEVOFLOXACIN 500 MG PO TABS: 500.0000 mg | ORAL_TABLET | Freq: Every day | ORAL | 0 refills | Status: AC

## 2020-04-30 MED ORDER — DEXAMETHASONE 2 MG PO TABS: 4.0000 mg | ORAL_TABLET | Freq: Two times a day (BID) | ORAL | 0 refills | Status: DC

## 2020-04-30 MED ORDER — MIDODRINE HCL 5 MG PO TABS: 5.0000 mg | ORAL_TABLET | Freq: Two times a day (BID) | ORAL | 2 refills | Status: DC

## 2020-04-30 MED ORDER — ESCITALOPRAM OXALATE 5 MG PO TABS: 5.0000 mg | ORAL_TABLET | Freq: Every day | ORAL | 2 refills | Status: DC

## 2020-04-30 NOTE — Progress Notes (Addendum)
Discussed MRI with patient at length. Education provided on the benefits of completing MRI. Discussed use of Ativan. Pt is not agreeable to MRI at this time and request to be discharged. Pt stated that she will need to be sedated to complete testing. [facility]ed [facility] is an option. Pt is still not agreeable at this time. Will update provider.

## 2020-04-30 NOTE — Progress Notes (Addendum)
Patient stated "Im ready to go I don't need anything else." Provider updated and is preparing paper for discharge. Per patient request,  call placed to patients sister, [name] to confirm oxygen for transport.

## 2020-04-30 NOTE — Progress Notes (Addendum)
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 Presents with concerns for aspiration pneumonia.  Assessment & Plan:   Active Problems:   PNA (pneumonia)   Sepsis ([place])   SOB (shortness of breath)  #1 sepsis secondary to aspiration pneumonia present on admission patient met criteria for sepsis with fever hypotension and hypoxia. Currently on Rocephin and Flagyl. Speech therapy is following.  They recommend dysphagia 3 diet.  Since patient started coughing with dysphagia 3 diet it was changed to full liquid diet only. Patient was tried on PMV yesterday and she did well.  #2 AKI improving she received IV fluids initially.  Creatinine is 1.14 from 1.56.  #3 history of laryngeal invasive carcinoma followed by oncology dr kale  #4 acute DVT -Doppler was positive for acute popliteal  DVT in the right leg.  Heparin on hold since CT scan of the head showed evidence of vasogenic edema in the cerebrum? MRI brain with and without to r/o mets  May need IVC filter.  #5 morbid obesity BMI of 44 risk for poor outcome.  #6 abnormal CT soft tissue neck with potential vasogenic edema on the right posterior cerebral area.  Decadron was added Heparin was stopped.  Patient agrees to have MRI of the brain done.  Will order MRI for today.  Give her Ativan 1 mg prior to MRI.  #7 diarrhea likely antibiotics induced.start florastor. Imodium as needed start probiotics  #8 depression we will start low-dose Lexapro  Pressure Injury 04/28/20 Throat Mid;Lower Stage 2 -  Partial thickness loss of dermis presenting as a shallow open injury with a red, pink wound bed without slough. under trach flange (Active)  04/28/20 1000   Location: Throat  Location Orientation: Mid;Lower  Staging: Stage 2 -  Partial thickness loss of dermis presenting as a shallow open injury with a red, pink wound bed without slough.  Wound Description (Comments): under trach flange  Present on Admission: No    Nutrition Problem: Inadequate oral intake Etiology: cancer and cancer related treatments,dysphagia  Signs/Symptoms:  (s/p PEG for supplemental nutrition/hydration)  Interventions: MVI,Tube feeding  Estimated body mass index is 45.38 kg/m as calculated from the following:   Height as of this encounter: _0  (1.651 m).   Weight as of this encounter: 123.7 kg.  DVT prophylaxis: TED hose Heparin was stopped due to cerebral edema.   Code Status: Partial code Family Communication dw sister 04/30/2020. disposition Plan:  Status is: Inpatient  Dispo: The patient is from: Home              Anticipated d/c is to: Home              Anticipated d/c date is: 2 days              Patient currently is not medically stable to d/c.   Consultants: None  Procedures: None Antimicrobials- Anti-infectives (From admission, onward)   Start     Dose/Rate Route Frequency Ordered Stop   04/24/20 2200  metroNIDAZOLE (FLAGYL) tablet 500 mg        500 mg Per Tube Every 8 hours 04/24/20 1823     04/24/20 1830  azithromycin (ZITHROMAX) 500 mg in sodium chloride 0.9 % 250 mL IVPB  Status:  Discontinued        500 mg 250 mL/hr over 60 Minutes Intravenous Every 24 hours 04/24/20 1819 04/24/20 1822   04/24/20 1830  cefTRIAXone (ROCEPHIN) 2 g in sodium chloride 0.9 % 100 mL IVPB        2 g 200 mL/hr over 30 Minutes Intravenous Every 24 hours 04/24/20 1822     04/24/20 1515  ciprofloxacin (CIPRO) IVPB 400 mg        400 mg 200 mL/hr over 60 Minutes Intravenous  Once 04/24/20 1501 04/24/20 1717   04/24/20 1515  metroNIDAZOLE (FLAGYL) IVPB 500 mg        500 mg 100 mL/hr over 60 Minutes Intravenous  Once 04/24/20 1501 04/24/20 1717        Subjective: Patient is resting in bed she is awake and alert.  She tells me today she does not want to go to [facility] to get an MRI.  Yesterday she told me she wanted to be put down for the MRI.  And she refused to have the MRI done at [facility] even after me telling her that I will give her Ativan prior to the MRI.  Today she is willing to have MRI at [facility] with Ativan. Objective: Vitals:   04/30/20 0812 04/30/20 0816 04/30/20 1100 04/30/20 1244  BP:    125/69  Pulse:   69 (!) 55  Resp:  _0 Temp:    98.1 F (36.7 C)  TempSrc:      SpO2: 93%  93% 91%  Weight:      Height:        Intake/Output Summary (Last 24 hours) at 04/30/2020 1519 Last data filed at 04/30/2020 0610 Gross per 24 hour  Intake 1174 ml  Output 550 ml  Net 624 ml   Filed Weights   04/28/20 0500 04/29/20 0629 04/30/20 0500  Weight: 123 kg 121.4 kg 123.7 kg    Examination: Patient resting in bed trach and PEG tube in place General exam: Appears calm and comfortable  Respiratory system: Bilateral rhonchi and wheezing to auscultation. Respiratory effort normal. Cardiovascular system: S1 & S2 heard, RRR. No JVD, murmurs, rubs, gallops or clicks. No pedal edema. Gastrointestinal system: Abdomen is nondistended, soft and nontender. No organomegaly or masses felt. Normal bowel sounds heard.  PEG tube in place Central nervous system: Alert and oriented. No focal neurological deficits. Extremities: 1+ pitting edema Skin: No rashes, lesions or ulcers Psychiatry: Judgement and insight appear normal. Mood & affect appropriate.     Data Reviewed: I have personally reviewed following labs and imaging studies  CBC: Recent Labs  Lab 04/24/20 1534 04/25/20 0523 04/25/20 0810 04/26/20 0426 04/27/20 1348 04/28/20 0337 04/29/20 0530 04/30/20 0524  WBC 8.5   < > 4.8 4.4 5.0 7.0 7.4 6.8  NEUTROABS 7.1  --  3.5 3.0  --   --   --   --   HGB 10.2*   < > 9.5* 9.2* 9.7* 9.3* 9.7* 9.9*  HCT 32.9*   < > 29.8* 29.2*  30.3* 29.1* 30.4* 31.4*  MCV 99.7   < > 99.3 100.0 99.0 99.3 98.7 100.0  PLT 187   < > 137* 127* 151 141* 130* 121*   < > = values in this interval not displayed.   Basic Metabolic Panel: Recent Labs  Lab 04/26/20 0426 04/27/20 0415 04/28/20 0337 04/29/20 0530 04/30/20 0524  NA 136 137 136 136 138  K 4.5 4.2 4.7 5.5* 5.8*  CL  101 102 101 101 103  CO2 _0 GLUCOSE 93 92 165* 143* 120*  BUN 22* 18 15 22* 32*  CREATININE 1.56* 1.28* 1.25* 1.14* 1.15*  CALCIUM 8.0* 7.7* 8.4* 8.8* 9.0  MG 1.7 1.9 2.0 2.1 2.1   GFR: Estimated Creatinine Clearance: 68.7 mL/min (A) (by C-G formula based on SCr of 1.15 mg/dL (H)). Liver Function Tests: Recent Labs  Lab 04/24/20 1534 04/25/20 0810 04/26/20 0426  AST 13* 12* 12*  ALT _1 ALKPHOS 42 38 37*  BILITOT 0.9 0.8 0.6  PROT 6.3* 5.7* 5.3*  ALBUMIN 3.0* 2.7* 2.5*   No results for input(s): LIPASE, AMYLASE in the last 168 hours. No results for input(s): AMMONIA in the last 168 hours. Coagulation Profile: Recent Labs  Lab 04/24/20 1534  INR 1.2   Cardiac Enzymes: No results for input(s): CKTOTAL, CKMB, CKMBINDEX, TROPONINI in the last 168 hours. BNP (last 3 results) No results for input(s): PROBNP in the last 8760 hours. HbA1C: No results for input(s): HGBA1C in the last 72 hours. CBG: Recent Labs  Lab 04/29/20 2012 04/30/20 0013 04/30/20 0449 04/30/20 0749 04/30/20 1141  GLUCAP 123* 156* 116* 167* 165*   Lipid Profile: No results for input(s): CHOL, HDL, LDLCALC, TRIG, CHOLHDL, LDLDIRECT in the last 72 hours. Thyroid Function Tests: No results for input(s): TSH, T4TOTAL, FREET4, T3FREE, THYROIDAB in the last 72 hours. Anemia Panel: No results for input(s): VITAMINB12, FOLATE, FERRITIN, TIBC, IRON, RETICCTPCT in the last 72 hours. Sepsis Labs: Recent Labs  Lab 04/24/20 1535 04/24/20 1722 04/25/20 0810 04/26/20 0426 04/27/20 0415  PROCALCITON  --   --   --  <0.10 <0.10  LATICACIDVEN 0.8 0.8 0.8 0.9   --     Recent Results (from the past 240 hour(s))  Blood Culture (routine x 2)     Status: None   Collection Time: 04/24/20  3:34 PM   Specimen: Left Antecubital; Blood  Result Value Ref Range Status   Specimen Description LEFT ANTECUBITAL  Final   Special Requests   Final    BOTTLES DRAWN AEROBIC AND ANAEROBIC Blood Culture adequate volume   Culture   Final    NO GROWTH 5 DAYS Performed at [facility], [address], [place], [place] [address]    Report Status 04/29/2020 FINAL  Final  Blood Culture (routine x 2)     Status: None   Collection Time: 04/24/20  3:34 PM   Specimen: Right Antecubital; Blood  Result Value Ref Range Status   Specimen Description RIGHT ANTECUBITAL  Final   Special Requests   Final    BOTTLES DRAWN AEROBIC ONLY Blood Culture results may not be optimal due to an inadequate volume of blood received in culture bottles   Culture   Final    NO GROWTH 5 DAYS Performed at [facility], [address], [place], [place] [address]    Report Status 04/29/2020 FINAL  Final  Resp Panel by RT-PCR (Flu A&B, Covid) Nasopharyngeal Swab     Status: None   Collection Time: 04/24/20  3:35 PM   Specimen: Nasopharyngeal Swab; Nasopharyngeal(NP) swabs in vial transport medium  Result Value Ref Range Status   SARS Coronavirus 2 by RT PCR NEGATIVE NEGATIVE Final    Comment: (NOTE) SARS-CoV-2 target nucleic acids are NOT DETECTED.  The SARS-CoV-2 RNA is generally detectable in upper respiratory specimens during the acute phase of infection. The lowest concentration of SARS-CoV-2 viral copies this assay can detect is 138  copies/mL. A negative result does not preclude SARS-Cov-2 infection and should not be used as the sole basis for treatment or other patient management decisions. A negative result may occur with  improper specimen collection/handling, submission of specimen other than nasopharyngeal swab, presence of viral mutation(s) within the areas targeted by this  assay, and inadequate number of viral copies(<138 copies/mL). A negative result must be combined with clinical observations, patient history, and epidemiological information. The expected result is Negative.  Fact Sheet for Patients:  EntrepreneurPulse.com.au  Fact Sheet for Healthcare Providers:  IncredibleEmployment.be  This test is no t yet approved or cleared by the [place] FDA and  has been authorized for detection and/or diagnosis of SARS-CoV-2 by FDA under an Emergency Use Authorization (EUA). This EUA will remain  in effect (meaning this test can be used) for the duration of the COVID-19 declaration under Section 564(b)(1) of the Act, 21 U.S.C.section 360bbb-3(b)(1), unless the authorization is terminated  or revoked sooner.       Influenza A by PCR NEGATIVE NEGATIVE Final   Influenza B by PCR NEGATIVE NEGATIVE Final    Comment: (NOTE) The Xpert Xpress SARS-CoV-2/FLU/RSV plus assay is intended as an aid in the diagnosis of influenza from Nasopharyngeal swab specimens and should not be used as a sole basis for treatment. Nasal washings and aspirates are unacceptable for Xpert Xpress SARS-CoV-2/FLU/RSV testing.  Fact Sheet for Patients: EntrepreneurPulse.com.au  Fact Sheet for Healthcare Providers: IncredibleEmployment.be  This test is not yet approved or cleared by the [place] FDA and has been authorized for detection and/or diagnosis of SARS-CoV-2 by FDA under an Emergency Use Authorization (EUA). This EUA will remain in effect (meaning this test can be used) for the duration of the COVID-19 declaration under Section 564(b)(1) of the Act, 21 U.S.C. section 360bbb-3(b)(1), unless the authorization is terminated or revoked.  Performed at [facility], [address], [place], [place] [address]   Culture, respiratory (non-expectorated)     Status: None   Collection Time: 04/24/20 10:20  PM   Specimen: Tracheal Aspirate; Respiratory  Result Value Ref Range Status   Specimen Description   Final    TRACHEAL ASPIRATE Performed at [name] [address], [place], [place] [address]    Special Requests   Final    NONE Performed at [facility], [place] [address] [place], [place] [address]    Gram Stain   Final    ABUNDANT WBC PRESENT, PREDOMINANTLY PMN ABUNDANT GRAM POSITIVE COCCI ABUNDANT GRAM POSITIVE RODS    Culture   Final    FEW Normal respiratory flora-no Staph aureus or Pseudomonas seen Performed at [facility] [address] [place], [place] [address]    Report Status 04/27/2020 FINAL  Final         Radiology Studies: DG Chest 1 View  Result Date: 04/29/2020 CLINICAL DATA:  Possible aspiration pneumonia. EXAM: CHEST  1 VIEW COMPARISON:  April 24, 2020. FINDINGS: Stable cardiomegaly. Tracheostomy tube is unchanged in position. No pneumothorax is noted. Increased right middle lobe opacity is noted concerning for atelectasis or infiltrate. Stable left basilar atelectasis or infiltrate is noted. Bony thorax is unremarkable. IMPRESSION: Increased right middle lobe opacity is noted concerning for atelectasis or infiltrate. Stable left basilar atelectasis or infiltrate is noted. Electronically Signed   By: Marijo Conception M.D.   On: 04/29/2020 13:23        Scheduled Meds: . arformoterol  15 mcg Nebulization BID  .  benzonatate  100 mg Oral TID  . budesonide  0.5 mg Nebulization BID  . dexamethasone (DECADRON) injection  4 mg Intravenous Q12H  . escitalopram  5 mg Oral Daily  . feeding supplement (OSMOLITE 1.5 CAL)  237 mL Per Tube 6 X Daily  . feeding supplement (PROSource TF)  45 mL Per Tube BID  . gabapentin  300 mg Per Tube QHS  . guaiFENesin-dextromethorphan  5 mL Per Tube QID  . ipratropium-albuterol  3 mL Nebulization BID  . ketotifen  1 drop Both Eyes BID  . lidocaine  1 patch Transdermal Q24H   . metroNIDAZOLE  500 mg Per Tube Q8H  . midodrine  5 mg Oral BID WC  . multivitamin  15 mL Per Tube Daily  . neomycin-bacitracin-polymyxin   Topical BID  . nystatin  5 mL Mouth/Throat QID  . pantoprazole sodium  40 mg Per Tube Daily  . saccharomyces boulardii  250 mg Per Tube BID   Continuous Infusions: . cefTRIAXone (ROCEPHIN)  IV 2 g (04/29/20 1720)     LOS: 6 days     Georgette Shell, MD  04/30/2020, 3:19 PM

## 2020-04-30 NOTE — Plan of Care (Signed)
  Problem: Health Behavior/Discharge Planning: °Goal: Ability to manage health-related needs will improve °Outcome: Progressing °  °Problem: Nutrition: °Goal: Adequate nutrition will be maintained °Outcome: Progressing °  °Problem: Coping: °Goal: Level of anxiety will decrease °Outcome: Progressing °  °Problem: Elimination: °Goal: Will not experience complications related to bowel motility °Outcome: Progressing °Goal: Will not experience complications related to urinary retention °Outcome: Progressing °  °Problem: Pain Managment: °Goal: General experience of comfort will improve °Outcome: Progressing °  °Problem: Safety: °Goal: Ability to remain free from injury will improve °Outcome: Progressing °  °Problem: Skin Integrity: °Goal: Risk for impaired skin integrity will decrease °Outcome: Progressing °  °

## 2020-04-30 NOTE — Progress Notes (Addendum)
Discharge instructions reviewed, Hard prescription provided for PT/PEG. Pt denied questions, concerns at this time. Pt was encouraged to complete follow up care recommendations and to adhere to medication regimen. Pt has refused 1700 and 1800 medications. Pt remains stable at this time.

## 2020-04-30 NOTE — Progress Notes (Signed)
OT Cancellation Note  Patient Details Name: Veronica Horton MRN: [id number] DOB: 1960-10-31   Cancelled Treatment:    Reason Eval/Treat Not Completed: Other (comment). Declined therapy stating she is going home today and soon. Will f/u if patient stays in [place].  Snigdha Howser L Keiosha Cancro 04/30/2020, 3:31 PM

## 2020-05-01 NOTE — Discharge Summary (Signed)
Physician Discharge Summary  GENEAN ADAMSKI OIZ:[id number] DOB: 18-Apr-1961 DOA: 04/24/2020  PCP: [facility] date: 04/24/2020 Discharge date: 05/01/2020  Admitted From: Home  disposition: Home Recommendations for Outpatient Follow-up:  1. Follow up with PCP in 1-2 weeks 2. 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 Presents with concerns for aspiration pneumonia.   Discharge Diagnoses:  Active Problems:   PNA (pneumonia)   Sepsis ([place])   SOB (shortness of breath)   Pressure Injury 04/28/20 Throat Mid;Lower Stage 2 -  Partial thickness loss of dermis presenting as a shallow open injury with a red, pink wound bed without slough. under trach flange (Active)  04/28/20 1000  Location: Throat  Location Orientation: Mid;Lower  Staging: Stage 2 -  Partial thickness loss of dermis presenting as a shallow open injury with a red, pink wound bed without slough.  Wound Description (Comments): under trach flange  Present on Admission: No      Nutrition Problem: Inadequate oral intake Etiology: cancer and cancer related treatments,dysphagia    Signs/Symptoms:  (s/p PEG for supplemental nutrition/hydration)   #1 sepsis secondary to aspiration pneumonia present on admission patient met criteria for sepsis with fever hypotension and hypoxia.  She was treated with Rocephin and Flagyl.  She was also seen by speech therapy.  She was tried on the PMV valve and she did well. Patient was adamant about being discharged and was refusing any further work-up.   #2 AKI improved with fluids.#3 history of  laryngeal invasive carcinoma followed by oncology dr kale  #4 acute DVT -Doppler was positive for acute popliteal  DVT in the right leg.  Heparin on hold since CT scan of the head showed evidence of vasogenic edema in the cerebrum? MRI brain was ordered however patient refused to do it.  This was discussed with the patient and family multiple times by me several nursing staff.  I had scheduled to have MRI of the brain at [facility] under sedation but she refused to go.  She refused to have it done at [facility] with even Ativan on board.  Since I was not able to do an MRI of the brain to exclude metastatic lesions she was not sent out on anticoagulation for DVT.  #5 morbid obesity BMI of 44 risk for poor outcome.  #6 abnormal CT soft tissue neck with potential vasogenic edema on the right posterior cerebral area.  She was discharged on Decadron.  Discussed with neurosurgery on-call advised to get MRI which she refused.  She will need to follow-up with neurosurgery on discharge.   #7 diarrhea likely antibiotics induced #8 depression we will start low-dose Lexapro  Interventions: MVI,Tube feeding  Estimated body mass index is 45.38 kg/m as calculated from the following:   Height as of this encounter: _0  (1.651 m).   Weight as of this encounter: 123.7 kg.  Discharge Instructions  Discharge Instructions    Diet - low sodium heart healthy   Complete by: As directed    Discharge wound care:   Complete by: As directed    See orders   Increase activity slowly   Complete by: As directed  Allergies as of 04/30/2020      Reactions   Codeine Hives   Reports itching only per RN   Penicillins Hives   Did it involve swelling of the face/tongue/throat, SOB, or low BP? No Did it involve sudden or severe rash/hives, skin peeling, or any reaction on the inside of your mouth or nose? Yes Did you need to seek medical attention at a hospital or doctor's office? Unknown When did it last  happen?Over 10 years If all above answers are "NO", may proceed with cephalosporin use.      Medication List    STOP taking these medications   lisinopril 20 MG tablet Commonly known as: ZESTRIL   nicotine 7 mg/24hr patch Commonly known as: NICODERM CQ - dosed in mg/24 hr     TAKE these medications   arformoterol 15 MCG/2ML Nebu Commonly known as: BROVANA Take 2 mLs (15 mcg total) by nebulization 2 (two) times daily.   bisacodyl 10 MG suppository Commonly known as: DULCOLAX Place 1 suppository (10 mg total) rectally daily as needed for severe constipation.   budesonide 0.5 MG/2ML nebulizer solution Commonly known as: PULMICORT Take 2 mLs (0.5 mg total) by nebulization 2 (two) times daily.   dexamethasone 2 MG tablet Commonly known as: DECADRON Take 2 tablets (4 mg total) by mouth 2 (two) times daily.   diclofenac Sodium 1 % Gel Commonly known as: VOLTAREN Apply 2 g topically 4 (four) times daily as needed (neck/back pain).   diphenhydrAMINE 12.5 MG/5ML elixir Commonly known as: BENADRYL Place 5 mLs (12.5 mg total) into feeding tube every 6 (six) hours as needed for itching.   escitalopram 5 MG tablet Commonly known as: LEXAPRO Take 1 tablet (5 mg total) by mouth daily.   feeding supplement Liqd Take 237 mLs by mouth 2 (two) times daily between meals.   feeding supplement (OSMOLITE 1.5 CAL) Liqd Place 237 mLs into feeding tube 3 (three) times daily between meals.   lactose free nutrition Liqd Take 237 mLs by mouth daily.   folic acid 1 MG tablet Commonly known as: FOLVITE Take 1 tablet (1 mg total) by mouth daily.   furosemide 40 MG tablet Commonly known as: LASIX Take 40 mg by mouth daily.   gabapentin 300 MG capsule Commonly known as: NEURONTIN Take 2 capsules (600 mg total) by mouth at bedtime.   guaiFENesin-dextromethorphan 100-10 MG/5ML syrup Commonly known as: ROBITUSSIN DM Take 10 mLs by mouth every 4 (four) hours as needed for cough.    hydrOXYzine 10 MG tablet Commonly known as: ATARAX/VISTARIL Place 1 tablet (10 mg total) into feeding tube 3 (three) times daily as needed for anxiety.   ipratropium-albuterol 0.5-2.5 (3) MG/3ML Soln Commonly known as: DUONEB Take 3 mLs by nebulization every 6 (six) hours as needed.   ketotifen 0.025 % ophthalmic solution Commonly known as: ZADITOR Place 1 drop into both eyes 2 (two) times daily.   levofloxacin 500 MG tablet Commonly known as: Levaquin Take 1 tablet (500 mg total) by mouth daily for 10 days.   lidocaine 5 % Commonly known as: LIDODERM Place 1 patch onto the skin daily. Remove & Discard patch within 12 hours or as directed by MD   melatonin 5 MG Tabs Take 1 tablet (5 mg total) by mouth at bedtime.   midodrine 5 MG tablet Commonly known as: PROAMATINE Take 1 tablet (5 mg total) by mouth 2 (two) times daily with a meal.   ondansetron 8 MG tablet Commonly known as: Zofran Take  1 tablet (8 mg total) by mouth 2 (two) times daily as needed. Start on the third day after chemotherapy.   oxyCODONE 5 MG immediate release tablet Commonly known as: Oxy IR/ROXICODONE Place 1 tablet (5 mg total) into feeding tube every 6 (six) hours as needed for severe pain.   pantoprazole sodium 40 mg/20 mL Pack Commonly known as: PROTONIX Place 20 mLs (40 mg total) into feeding tube daily.   polyethylene glycol 17 g packet Commonly known as: MIRALAX / GLYCOLAX Place 17 g into feeding tube 2 (two) times daily as needed for moderate constipation.   prochlorperazine 10 MG tablet Commonly known as: COMPAZINE Take 1 tablet (10 mg total) by mouth every 6 (six) hours as needed (Nausea or vomiting).   saccharomyces boulardii 250 MG capsule Commonly known as: FLORASTOR Take 1 capsule (250 mg total) by mouth 2 (two) times daily.   scopolamine 1 MG/3DAYS Commonly known as: TRANSDERM-SCOP Place 1 patch (1.5 mg total) onto the skin every 3 (three) days.   vitamin B-12 500 MCG  tablet Commonly known as: CYANOCOBALAMIN Take 1 tablet (500 mcg total) by mouth daily.            Discharge Care Instructions  (From admission, onward)         Start     Ordered   04/30/20 0000  Discharge wound care:       Comments: See orders   04/30/20 1707          Follow-up Information    [facility].   Contact information: [address] [place] [address] [phone]        Dawley, Troy C, DO Follow up.   Why: Patient has vasogenic edema and history of malignancy Contact information: [address] [place] [address] [phone]              Allergies  Allergen Reactions  . Codeine Hives    Reports itching only per RN  . Penicillins Hives    Did it involve swelling of the face/tongue/throat, SOB, or low BP? No Did it involve sudden or severe rash/hives, skin peeling, or any reaction on the inside of your mouth or nose? Yes Did you need to seek medical attention at a hospital or doctor's office? Unknown When did it last happen?Over 10 years If all above answers are "NO", may proceed with cephalosporin use.     Consultations: None     Procedures/Studies: CT ABDOMEN PELVIS WO CONTRAST  Result Date: 04/09/2020 CLINICAL DATA:  Intermittent lower quadrant abdominal pain, diminished bowel movements over past 3 days, fever, history COPD, hypertension, hiatal hernia EXAM: CT ABDOMEN AND PELVIS WITHOUT CONTRAST TECHNIQUE: Multidetector CT imaging of the abdomen and pelvis was performed following the standard protocol without IV contrast. Sagittal and coronal MPR images reconstructed from axial data set. COMPARISON:  03/15/2020 FINDINGS: Lower chest: Bibasilar consolidation, volume loss and pleural effusions Hepatobiliary: Gallbladder surgically absent. Liver normal appearance Pancreas: Atrophic, otherwise unremarkable Spleen: Normal appearance Adrenals/Urinary Tract: LEFT renal cysts unchanged.  Adrenal glands, kidneys, ureters, and bladder otherwise normal appearance Stomach/Bowel: Scattered radiopacities within stool. Normal appendix. Gastrostomy tube in stomach. Mild diverticulosis of distal descending and proximal sigmoid colon without evidence of diverticulitis. Significant rectal wall thickening with surrounding perirectal infiltrative changes extending into presacral space favor proctitis though tumor not excluded. Prior ventral hernia repair with recurrent hernia identified at the inferior margin of the repair containing a nonobstructed small bowel loop (  see coronal images 81-82). Vascular/Lymphatic: Atherosclerotic calcifications aorta and iliac arteries without aneurysm. No adenopathy. Reproductive: Unremarkable uterus and adnexa Other: Infiltration of fat within small bowel mesentery consistent with fibrosing mesenteritis. No free air or free fluid. Musculoskeletal: Osseous demineralization. IMPRESSION: Distal colonic diverticulosis without evidence of diverticulitis. Significant rectal wall thickening with surrounding perirectal infiltrative changes extending into presacral space favor proctitis though tumor not excluded; recommend correlation with proctoscopy. Prior ventral hernia repair with recurrent hernia at the inferior margin of the repair, containing a nonobstructed small bowel loop. Changes of fibrosing mesenteritis. Bibasilar consolidation, volume loss and pleural effusions. Aortic Atherosclerosis (ICD10-I70.0). Electronically Signed   By: Lavonia Dana M.D.   On: 04/09/2020 13:27   DG Chest 1 View  Result Date: 04/29/2020 CLINICAL DATA:  Possible aspiration pneumonia. EXAM: CHEST  1 VIEW COMPARISON:  April 24, 2020. FINDINGS: Stable cardiomegaly. Tracheostomy tube is unchanged in position. No pneumothorax is noted. Increased right middle lobe opacity is noted concerning for atelectasis or infiltrate. Stable left basilar atelectasis or infiltrate is noted. Bony thorax is  unremarkable. IMPRESSION: Increased right middle lobe opacity is noted concerning for atelectasis or infiltrate. Stable left basilar atelectasis or infiltrate is noted. Electronically Signed   By: Marijo Conception M.D.   On: 04/29/2020 13:23   CT HEAD WO CONTRAST  Result Date: 04/27/2020 CLINICAL DATA:  Cerebral edema on heparin drip. EXAM: CT HEAD WITHOUT CONTRAST TECHNIQUE: Contiguous axial images were obtained from the base of the skull through the vertex without intravenous contrast. COMPARISON:  No head CT for comparison FINDINGS: Brain: No midline shift. Signs of vasogenic edema with mass along the high RIGHT parietal region which may be extra-axial measuring approximately 3.7 x 3.0 x 2.3 cm. Extensive vasogenic edema in the RIGHT occipital and parietal lobe with mild mass effect upon the posterior horn of the lateral ventricle but without signs of hemorrhage. Mass appears to have areas of calcification. No hydrocephalus or abnormal extra-axial fluid. Vascular: No hyperdense vessel or unexpected calcification. Skull: Normal. Negative for fracture or focal lesion. Sinuses/Orbits: Air-fluid level in the LEFT sphenoid sinus. Mild mucosal thickening of LEFT sphenoid. Orbits are unremarkable. Other: None. IMPRESSION: 1. Extensive vasogenic edema in the RIGHT parietal/occipital region associated with what is likely an extra-axial mass with calcification. Meningioma is favored. Metastatic disease while considered is felt less likely. MRI with and without contrast is suggested for further assessment. 2. Mild mass effect upon the RIGHT lateral ventricle, posterior horn, no signs of midline shift. 3. No signs of intracranial hemorrhage or abnormal extra-axial fluid. Electronically Signed   By: Zetta Bills M.D.   On: 04/27/2020 19:56   CT SOFT TISSUE NECK WO CONTRAST  Result Date: 04/27/2020 CLINICAL DATA:  Obstructive dysphagia, laryngeal squamous cell carcinoma post tracheostomy EXAM: CT NECK WITHOUT  CONTRAST TECHNIQUE: Multidetector CT imaging of the neck was performed following the standard protocol without intravenous contrast. COMPARISON:  03/11/2020 FINDINGS: Pharynx and larynx: Abnormal soft tissue thickening involving the supraglottic larynx and hypopharynx as seen previously with paraglottic extension. True cords may be spared but submucosal involvement is not excluded. Cartilage involvement as previously described. Measurement is difficult but there has been a decrease in size with improved visualization of the airway. Salivary glands: Unremarkable. Thyroid: Unremarkable. Lymph nodes: Suboptimal evaluation in the absence of intravenous contrast. Abnormal left level 3 node (axial series 2, image 78) is similar in size. Left supraclavicular node on image 89 has increased in size measuring 1.5 x 0.9 cm (previously 1.2 x  0.7 cm). Vascular: No significant abnormality on this noncontrast study. Limited intracranial: Hypoattenuation in the right posterior cerebral white matter. There is mild mass effect. Partially imaged focus of mineralization at the superior margin of this region. Visualized orbits: Unremarkable. Mastoids and visualized paranasal sinuses: Mild paranasal sinus mucosal thickening. Minimal right mastoid opacification. Skeleton: No acute osseous abnormality. Upper chest: Refer to dedicated chest imaging. Other: None. IMPRESSION: Similar extent but decreased bulkiness of supraglottic tumor with improved visualization of airway. Similar size of enlarged left level 3 node. Increase in size of left supraclavicular node. Hypoattenuation in the right posterior cerebral white matter. This was not imaged on the prior study and may reflect vasogenic edema. MRI with contrast recommended. These results will be called to the ordering clinician or representative by the Radiologist Assistant, and communication documented in the PACS or [employer]. Electronically Signed   By: Macy Mis M.D.   On:  04/27/2020 15:22   CT CHEST WO CONTRAST  Result Date: 04/27/2020 CLINICAL DATA:  Pneumonia. EXAM: CT CHEST WITHOUT CONTRAST TECHNIQUE: Multidetector CT imaging of the chest was performed following the standard protocol without IV contrast. COMPARISON:  April 13, 2020. FINDINGS: Cardiovascular: No evidence of thoracic aortic aneurysm. Mild cardiomegaly is noted. No pericardial effusion. Mediastinum/Nodes: Tracheostomy tube is in good position. The esophagus is unremarkable. No significant adenopathy is noted. Thyroid gland is unremarkable. Lungs/Pleura: No pneumothorax is noted. Small bilateral pleural effusions are noted with adjacent subsegmental atelectasis or infiltrates. Increased patchy opacities are noted in both upper lobes concerning for multifocal pneumonia. Upper Abdomen: No acute abnormality. Musculoskeletal: No chest wall mass or suspicious bone lesions identified. IMPRESSION: 1. Small bilateral pleural effusions are noted with adjacent subsegmental atelectasis or infiltrates. 2. Increased patchy opacities are noted in both upper lobes concerning for multifocal pneumonia. 3. Tracheostomy tube is in good position. Electronically Signed   By: Marijo Conception M.D.   On: 04/27/2020 15:10   CT CHEST WO CONTRAST  Result Date: 04/13/2020 CLINICAL DATA:  Pleural effusion EXAM: CT CHEST WITHOUT CONTRAST TECHNIQUE: Multidetector CT imaging of the chest was performed following the standard protocol without IV contrast. COMPARISON:  03/15/2020 FINDINGS: Cardiovascular: Extensive coronary artery calcification again noted predominantly within the left anterior descending coronary artery. Global cardiac size within normal limits. No pericardial effusion. Central pulmonary arteries are enlarged in keeping with changes of pulmonary arterial hypertension, similar to that noted on prior examination. Mild atherosclerotic calcification is seen within the thoracic aorta. Aberrant right subclavian artery noted.  Mediastinum/Nodes: Tracheostomy in expected position. No pathologic thoracic adenopathy. The esophagus is unremarkable. Lungs/Pleura: Patchy pulmonary infiltrate is seen within the a left upper lobe, new since prior examination and likely infectious in the acute setting. Bibasilar atelectasis is present with subtotal collapse of the lower lobes bilaterally. Small bilateral pleural effusions are present, left greater than right. No pneumothorax. No central obstructing lesion. Upper Abdomen: Cholecystectomy has been performed. No acute abnormality. Musculoskeletal: No lytic or blastic bone lesions are seen. Multiple remote left rib fractures are noted no acute bone abnormality. IMPRESSION: Small bilateral pleural effusions with associated bibasilar atelectasis. Patchy airspace infiltrate within the left upper lobe, likely infectious in the acute setting. No central obstructing lesion. Extensive coronary artery calcification. Morphologic changes in keeping with pulmonary arterial hypertension. Aortic Atherosclerosis (ICD10-I70.0). Electronically Signed   By: Fidela Salisbury MD   On: 04/13/2020 20:19   CT ABDOMEN PELVIS W CONTRAST  Result Date: 04/24/2020 CLINICAL DATA:  Abdominal pain, fever, for surge EXAM: CT  ABDOMEN AND PELVIS WITH CONTRAST TECHNIQUE: Multidetector CT imaging of the abdomen and pelvis was performed using the standard protocol following bolus administration of intravenous contrast. CONTRAST:  38mL OMNIPAQUE IOHEXOL 300 MG/ML  SOLN COMPARISON:  04/09/2020 FINDINGS: Lower chest: Bandlike scarring and atelectasis of the dependent bilateral lung bases. Multiple nonacute fractures of the posterior left ribs. Hepatobiliary: No focal liver abnormality is seen. Status post cholecystectomy. No biliary dilatation. Pancreas: Unremarkable. No pancreatic ductal dilatation or surrounding inflammatory changes. Spleen: Normal in size without significant abnormality. Adrenals/Urinary Tract: Adrenal glands are  unremarkable. Kidneys are normal, without renal calculi, solid lesion, or hydronephrosis. Bladder is unremarkable. Stomach/Bowel: Percutaneous gastrostomy, balloon within the gastric lumen. Appendix appears normal. No evidence of bowel wall thickening, distention, or inflammatory changes. Vascular/Lymphatic: Aortic atherosclerosis. No enlarged abdominal or pelvic lymph nodes. Reproductive: No mass or other significant abnormality. Other: Broad-based midline ventral abdominal hernia, status post mesh repair. Redemonstrated recurrent inferior hernia component, containing a single, nonobstructed loop of mid small bowel (series 2, image 62). No abdominopelvic ascites. Musculoskeletal: No acute or significant osseous findings. IMPRESSION: 1. No acute CT findings of the abdomen or pelvis to explain abdominal pain. No evidence infectious nidus internal to the abdomen or pelvis. 2. Previously identified perirectal fat stranding is resolved, consistent with resolution of infectious or inflammatory proctitis. 3. Broad-based midline ventral abdominal hernia, status post mesh repair. Redemonstrated recurrent inferior hernia component, containing a single, nonobstructed loop of mid small bowel. Aortic Atherosclerosis (ICD10-I70.0). Electronically Signed   By: Eddie Candle M.D.   On: 04/24/2020 17:05   DG Chest Port 1 View  Result Date: 04/24/2020 CLINICAL DATA:  Questionable sepsis EXAM: PORTABLE CHEST 1 VIEW COMPARISON:  04/11/2020, CT chest, 04/13/2020 FINDINGS: Cardiomegaly. Tracheostomy. Bandlike scarring or atelectasis of the right midlung. There is a background of diffuse, coarse appearing interstitial opacity. No acute appearing airspace opacity. IMPRESSION: 1.  Cardiomegaly. 2.  Tracheostomy. 3. Bandlike scarring or atelectasis of the right midlung. There is a background of diffuse, coarse appearing interstitial opacity, generally corresponding to scarring and atelectasis seen on prior CT. No acute appearing  airspace opacity. Electronically Signed   By: Eddie Candle M.D.   On: 04/24/2020 15:23   DG CHEST PORT 1 VIEW  Result Date: 04/11/2020 CLINICAL DATA:  Hypoxia. EXAM: PORTABLE CHEST 1 VIEW COMPARISON:  April 08, 2020. FINDINGS: Stable cardiomediastinal silhouette. Tracheostomy tube is unchanged. No pneumothorax is noted. Stable left pleural effusion is noted with associated left basilar atelectasis or infiltrate. Right midlung subsegmental atelectasis is noted with probable small right pleural effusion. Bony thorax is unremarkable. IMPRESSION: Stable left pleural effusion with associated left basilar atelectasis or infiltrate. Right midlung subsegmental atelectasis is noted with probable small right pleural effusion. Electronically Signed   By: Marijo Conception M.D.   On: 04/11/2020 13:20   DG CHEST PORT 1 VIEW  Result Date: 04/08/2020 CLINICAL DATA:  Respiratory failure. EXAM: PORTABLE CHEST 1 VIEW COMPARISON:  April 03, 2020 FINDINGS: There is stable tracheostomy tube positioning. Mild, diffuse chronic appearing increased interstitial lung markings are seen. Mild to moderate severity atelectasis and/or infiltrate is seen within the right lung base. This is similar in severity when compared to the prior study. Marked severity left basilar atelectasis and/or infiltrate is seen and is increased in severity when compared to the prior exam. There is no evidence of a pleural effusion or pneumothorax. There is mild to moderate severity enlargement of the cardiac silhouette. Degenerative changes are seen throughout the thoracic spine. IMPRESSION: 1.  Bibasilar atelectasis and/or infiltrate, left greater than right. Electronically Signed   By: Aram Candela M.D.   On: 04/08/2020 16:45   DG CHEST PORT 1 VIEW  Result Date: 04/03/2020 CLINICAL DATA:  Increasing shortness of breath EXAM: PORTABLE CHEST 1 VIEW COMPARISON:  03/31/2020 FINDINGS: Tracheostomy tube is noted in satisfactory position. Cardiac shadow  is enlarged but stable. Some persistent bibasilar opacities are noted although mildly improved when compared with the prior exam. No new focal abnormality is noted. IMPRESSION: Improved aeration as described. Electronically Signed   By: Alcide Clever M.D.   On: 04/03/2020 11:59   DG Abd Portable 1V  Result Date: 04/13/2020 CLINICAL DATA:  Abdominal pain EXAM: PORTABLE ABDOMEN - 1 VIEW COMPARISON:  CT abdomen and pelvis April 09, 2020; abdominal radiograph April 07, 2020 FINDINGS: Gastrostomy catheter overlying stomach region. Moderate stool in colon. No bowel dilatation or air-fluid level to suggest bowel obstruction. No free air. Postoperative change left pelvic region. Loculated effusion left base. IMPRESSION: No bowel obstruction or free air. Moderate stool in colon. Loculated pleural effusion lateral left base. Electronically Signed   By: Bretta Bang III M.D.   On: 04/13/2020 16:14   DG Abd Portable 1V  Result Date: 04/07/2020 CLINICAL DATA:  Abdomen pain EXAM: PORTABLE ABDOMEN - 1 VIEW COMPARISON:  CT 03/15/2020 FINDINGS: Gastrostomy tube in the left mid abdomen. Nonobstructed bowel-gas pattern. Evidence of prior hernia repair. Curvilinear densities in the right lower quadrant, possible calcifications or retained contrast within appendix. IMPRESSION: Nonobstructed gas pattern. Electronically Signed   By: Jasmine Pang M.D.   On: 04/07/2020 21:16   DG Swallowing Func-Speech Pathology  Result Date: 04/26/2020 Objective Swallowing Evaluation: Type of Study: MBS-Modified Barium Swallow Study  Patient Details Name: CERINITY ZYNDA MRN: [id number] Date of Birth: May 20, 1960 Today's Date: 04/26/2020 Time: SLP Start Time (ACUTE ONLY): 1850 -SLP Stop Time (ACUTE ONLY): 1901 SLP Time Calculation (min) (ACUTE ONLY): 11 min Past Medical History: Past Medical History: Diagnosis Date . Bronchitis  . Class 3 obesity 01/23/2020 . COPD (chronic obstructive pulmonary disease) (HCC)  . Degenerative disc  disease, lumbar  . Hiatal hernia  . Hypertension  Past Surgical History: Past Surgical History: Procedure Laterality Date . BIOPSY  04/12/2020  Procedure: BIOPSY;  Surgeon: Charlott Rakes, MD;  Location: [facility];  Service: Gastroenterology;; . CHOLECYSTECTOMY   . degenerative bone disease   . DIRECT LARYNGOSCOPY N/A 03/13/2020  Procedure: DIRECT LARYNGOSCOPY WITH BIOPSY;  Surgeon: Laren Boom, DO;  Location: [facility];  Service: ENT;  Laterality: N/A; . FLEXIBLE SIGMOIDOSCOPY N/A 04/12/2020  Procedure: FLEXIBLE SIGMOIDOSCOPY;  Surgeon: Charlott Rakes, MD;  Location: [facility];  Service: Gastroenterology;  Laterality: N/A; . INCISIONAL HERNIA REPAIR N/A 01/15/2019  Procedure: Sherald Hess HERNIORRHAPHY WITH MESH;  Surgeon: Franky Macho, MD;  Location: [facility];  Service: General;  Laterality: N/A; . IR GASTROSTOMY TUBE MOD SED  03/22/2020 . OMENTECTOMY N/A 01/15/2019  Procedure: OMENTECTOMY;  Surgeon: Franky Macho, MD;  Location: [facility];  Service: General;  Laterality: N/A; . POLYPECTOMY  04/12/2020  Procedure: POLYPECTOMY;  Surgeon: Charlott Rakes, MD;  Location[facility];  Service: Gastroenterology;; . 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  Prior  medical history includes smoking and some dyspnea if walking long distanced.  She has been on a dys3/thin diet and follow up indicated to provide information including exercises etc for potential side effects of XRT/chemo.  Pt has been maintained on a dys3/thin diet and tube feeding. Readmitted with pna - concern for aspiration.  Subjective: pt awake in chair Assessment / Plan / Recommendation CHL IP CLINICAL  IMPRESSIONS 04/26/2020 Clinical Impression -- SLP Visit Diagnosis Dysphagia, pharyngoesophageal phase (R13.14) Attention and concentration deficit following -- Frontal lobe and executive function deficit following -- Impact on safety and function --   [facility] IP TREATMENT RECOMMENDATION 04/26/2020 Treatment Recommendations Therapy as outlined in treatment plan below   Prognosis 04/26/2020 Prognosis for Safe Diet Advancement Good Barriers to Reach Goals -- Barriers/Prognosis Comment -- [facility] IP DIET RECOMMENDATION 04/26/2020 SLP Diet Recommendations -- Liquid Administration via -- Medication Administration -- Compensations Slow rate;Small sips/bites Postural Changes --   CH[facility] IP OTHER RECOMMENDATIONS 04/26/2020 Recommended Consults -- Oral Care Recommendations Oral care BID Other Recommendations Clarify dietary restrictions;Have oral suction available   [facility] IP FOLLOW UP RECOMMENDATIONS 04/26/2020 Follow up Recommendations Skilled Nursing facility   [facility] IP FREQUENCY AND DURATION 04/26/2020 Speech Therapy Frequency (ACUTE ONLY) min 1 x/week Treatment Duration 1 week      CH[facility] IP ORAL PHASE 04/26/2020 Oral Phase WFL Oral - Pudding Teaspoon -- Oral - Pudding Cup -- Oral - Honey Teaspoon -- Oral - Honey Cup -- Oral - Nectar Teaspoon -- Oral - Nectar Cup -- Oral - Nectar Straw -- Oral - Thin Teaspoon -- Oral - Thin Cup -- Oral - Thin Straw -- Oral - Puree -- Oral - Mech Soft -- Oral - Regular -- Oral - Multi-Consistency -- Oral - Pill -- Oral Phase - Comment --  CH[facility] IP PHARYNGEAL PHASE 04/26/2020 Pharyngeal Phase Impaired Pharyngeal- Pudding Teaspoon -- Pharyngeal -- Pharyngeal- Pudding Cup -- Pharyngeal -- Pharyngeal- Honey Teaspoon -- Pharyngeal -- Pharyngeal- Honey Cup -- Pharyngeal -- Pharyngeal- Nectar Teaspoon Reduced airway/laryngeal closure;Pharyngeal residue - valleculae Pharyngeal Material does not enter airway Pharyngeal- Nectar Cup -- Pharyngeal -- Pharyngeal- Nectar Straw Reduced airway/laryngeal closure;Reduced  epiglottic inversion;Penetration/Aspiration during swallow;Penetration/Apiration after swallow;Trace aspiration Pharyngeal Material enters airway, passes BELOW cords then ejected out;Material enters airway, remains ABOVE vocal cords and not ejected out Pharyngeal- Thin Teaspoon Reduced epiglottic inversion;Reduced airway/laryngeal closure Pharyngeal -- Pharyngeal- Thin Cup Reduced epiglottic inversion;Reduced airway/laryngeal closure;Penetration/Aspiration during swallow;Penetration/Apiration after swallow;Trace aspiration Pharyngeal Material enters airway, remains ABOVE vocal cords then ejected out;Material enters airway, passes BELOW cords then ejected out Pharyngeal- Thin Straw Penetration/Aspiration during swallow;Penetration/Apiration after swallow;Trace aspiration;Reduced epiglottic inversion Pharyngeal Material enters airway, passes BELOW cords then ejected out Pharyngeal- Puree Reduced epiglottic inversion;Pharyngeal residue - valleculae Pharyngeal -- Pharyngeal- Mechanical Soft Reduced epiglottic inversion;Pharyngeal residue - valleculae Pharyngeal Material does not enter airway Pharyngeal- Regular NT Pharyngeal -- Pharyngeal- Multi-consistency -- Pharyngeal -- Pharyngeal- Pill NT Pharyngeal -- Pharyngeal Comment --  CHL IP CERVICAL ESOPHAGEAL PHASE 04/26/2020 Cervical Esophageal Phase -- Pudding Teaspoon -- Pudding Cup -- Honey Teaspoon -- Honey Cup -- Nectar Teaspoon -- Nectar Cup -- Nectar Straw -- Thin Teaspoon -- Thin Cup Prominent cricopharyngeal segment Thin Straw -- Puree -- Mechanical Soft -- Regular -- Multi-consistency -- Pill -- Cervical Esophageal Comment no backflow observed during SLP MBS [name], MS [facility] SLP Acute Rehab Services Office [phone] Pager [phone] [name] 04/26/2020, 7:41 PM              VAS [place] LOWER EXTREMITY VENOUS (DVT)  Result Date: 04/26/2020  Lower Venous DVT Study Indications: Edema.  Risk Factors: Cancer. Limitations: Body habitus and poor  ultrasound/tissue interface. Comparison Study: No prior studies. Performing Technologist: Oliver Hum RVT  Examination Guidelines: A complete evaluation includes B-mode imaging, spectral Doppler, color Doppler, and power Doppler as needed of all accessible portions of each vessel. Bilateral testing is considered an integral part of a complete examination. Limited examinations for reoccurring indications may be performed as noted. The reflux portion of the exam is performed with the patient in reverse Trendelenburg.  +---------+---------------+---------+-----------+----------+-------------------+ RIGHT    CompressibilityPhasicitySpontaneityPropertiesThrombus Aging      +---------+---------------+---------+-----------+----------+-------------------+ CFV      Full           Yes      Yes                                      +---------+---------------+---------+-----------+----------+-------------------+ SFJ      Full                                                             +---------+---------------+---------+-----------+----------+-------------------+ FV Prox  Full                                                             +---------+---------------+---------+-----------+----------+-------------------+ FV Mid   Full                                                             +---------+---------------+---------+-----------+----------+-------------------+ FV Distal               Yes      Yes                                      +---------+---------------+---------+-----------+----------+-------------------+ PFV      Full                                                             +---------+---------------+---------+-----------+----------+-------------------+ POP      None           No       No                   Acute               +---------+---------------+---------+-----------+----------+-------------------+ PTV      Full                                                              +---------+---------------+---------+-----------+----------+-------------------+  PERO                                                  Not well visualized +---------+---------------+---------+-----------+----------+-------------------+   +---------+---------------+---------+-----------+----------+-------------------+ LEFT     CompressibilityPhasicitySpontaneityPropertiesThrombus Aging      +---------+---------------+---------+-----------+----------+-------------------+ CFV      Full           Yes      Yes                                      +---------+---------------+---------+-----------+----------+-------------------+ SFJ      Full                                                             +---------+---------------+---------+-----------+----------+-------------------+ FV Prox  Full                                                             +---------+---------------+---------+-----------+----------+-------------------+ FV Mid   Full                                                             +---------+---------------+---------+-----------+----------+-------------------+ FV Distal               Yes      Yes                                      +---------+---------------+---------+-----------+----------+-------------------+ PFV      Full                                                             +---------+---------------+---------+-----------+----------+-------------------+ POP      Full           Yes      Yes                                      +---------+---------------+---------+-----------+----------+-------------------+ PTV      Full                                                             +---------+---------------+---------+-----------+----------+-------------------+ PERO  Not well visualized  +---------+---------------+---------+-----------+----------+-------------------+     Summary: RIGHT: - Findings consistent with acute deep vein thrombosis involving the right popliteal vein. - No cystic structure found in the popliteal fossa.  LEFT: - There is no evidence of deep vein thrombosis in the lower extremity. However, portions of this examination were limited- see technologist comments above.  - No cystic structure found in the popliteal fossa.  *See table(s) above for measurements and observations. Electronically signed by Ruta Hinds MD on 04/26/2020 at 5:22:14 PM.    Final    VAS [place] UPPER EXTREMITY VENOUS DUPLEX  Result Date: 04/16/2020 UPPER VENOUS STUDY  Indications: Swelling, and Pain Comparison Study: no prior Performing Technologist: Abram Sander RVS  Examination Guidelines: A complete evaluation includes B-mode imaging, spectral Doppler, color Doppler, and power Doppler as needed of all accessible portions of each vessel. Bilateral testing is considered an integral part of a complete examination. Limited examinations for reoccurring indications may be performed as noted.  Right Findings: +----------+------------+---------+-----------+----------+-----------------+ RIGHT     CompressiblePhasicitySpontaneousProperties     Summary      +----------+------------+---------+-----------+----------+-----------------+ IJV           Full       Yes       Yes                                +----------+------------+---------+-----------+----------+-----------------+ Subclavian    Full       Yes       Yes                                +----------+------------+---------+-----------+----------+-----------------+ Axillary      Full       Yes       Yes                                +----------+------------+---------+-----------+----------+-----------------+ Brachial      Full       Yes       Yes                                 +----------+------------+---------+-----------+----------+-----------------+ Radial        Full                                                    +----------+------------+---------+-----------+----------+-----------------+ Ulnar         Full                                                    +----------+------------+---------+-----------+----------+-----------------+ Cephalic      None                                  Age Indeterminate +----------+------------+---------+-----------+----------+-----------------+ Basilic       None  Age Indeterminate +----------+------------+---------+-----------+----------+-----------------+  Summary:  Right: No evidence of deep vein thrombosis in the upper extremity. Findings consistent with age indeterminate superficial vein thrombosis involving the right cephalic vein and right basilic vein.  *See table(s) above for measurements and observations.  Diagnosing physician: Monica Martinez MD Electronically signed by Monica Martinez MD on 04/16/2020 at 4:45:55 PM.    Final     (Echo, Carotid, EGD, Colonoscopy, ERCP)    Subjective: Resting in bed awake alert  Discharge Exam: Vitals:   04/30/20 1244 04/30/20 1620  BP: 125/69   Pulse: (!) 55 78  Resp: 18 17  Temp: 98.1 F (36.7 C)   SpO2: 91% 95%   Vitals:   04/30/20 0816 04/30/20 1100 04/30/20 1244 04/30/20 1620  BP:   125/69   Pulse:  69 (!) 55 78  Resp: _0 Temp:   98.1 F (36.7 C)   TempSrc:      SpO2:  93% 91% 95%  Weight:      Height:        General: Pt is alert, awake, not in acute distress trach in place Cardiovascular: RRR, S1/S2 +, no rubs, no gallops Respiratory: CTA bilaterally, no wheezing, no rhonchi Abdominal: Soft, NT, ND, bowel sounds + PEG in place Extremities: no edema, no cyanosis    The results of significant diagnostics from this hospitalization (including imaging, microbiology, ancillary and laboratory)  are listed below for reference.     Microbiology: Recent Results (from the past 240 hour(s))  Blood Culture (routine x 2)     Status: None   Collection Time: 04/24/20  3:34 PM   Specimen: Left Antecubital; Blood  Result Value Ref Range Status   Specimen Description LEFT ANTECUBITAL  Final   Special Requests   Final    BOTTLES DRAWN AEROBIC AND ANAEROBIC Blood Culture adequate volume   Culture   Final    NO GROWTH 5 DAYS Performed at [facility], [address] [place], [place] [address]    Report Status 04/29/2020 FINAL  Final  Blood Culture (routine x 2)     Status: None   Collection Time: 04/24/20  3:34 PM   Specimen: Right Antecubital; Blood  Result Value Ref Range Status   Specimen Description RIGHT ANTECUBITAL  Final   Special Requests   Final    BOTTLES DRAWN AEROBIC ONLY Blood Culture results may not be optimal due to an inadequate volume of blood received in culture bottles   Culture   Final    NO GROWTH 5 DAYS Performed at [facility], [address] [place], [place] [address]    Report Status 04/29/2020 FINAL  Final  Resp Panel by RT-PCR (Flu A&B, Covid) Nasopharyngeal Swab     Status: None   Collection Time: 04/24/20  3:35 PM   Specimen: Nasopharyngeal Swab; Nasopharyngeal(NP) swabs in vial transport medium  Result Value Ref Range Status   SARS Coronavirus 2 by RT PCR NEGATIVE NEGATIVE Final    Comment: (NOTE) SARS-CoV-2 target nucleic acids are NOT DETECTED.  The SARS-CoV-2 RNA is generally detectable in upper respiratory specimens during the acute phase of infection. The lowest concentration of SARS-CoV-2 viral copies this assay can detect is 138 copies/mL. A negative result does not preclude SARS-Cov-2 infection and should not be used as the sole basis for treatment or other patient management decisions. A negative result may occur with  improper specimen collection/handling, submission of specimen other than nasopharyngeal swab, presence of viral  mutation(s) within the  areas targeted by this assay, and inadequate number of viral copies(<138 copies/mL). A negative result must be combined with clinical observations, patient history, and epidemiological information. The expected result is Negative.  Fact Sheet for Patients:  EntrepreneurPulse.com.au  Fact Sheet for Healthcare Providers:  IncredibleEmployment.be  This test is no t yet approved or cleared by the [place] FDA and  has been authorized for detection and/or diagnosis of SARS-CoV-2 by FDA under an Emergency Use Authorization (EUA). This EUA will remain  in effect (meaning this test can be used) for the duration of the COVID-19 declaration under Section 564(b)(1) of the Act, 21 U.S.C.section 360bbb-3(b)(1), unless the authorization is terminated  or revoked sooner.       Influenza A by PCR NEGATIVE NEGATIVE Final   Influenza B by PCR NEGATIVE NEGATIVE Final    Comment: (NOTE) The Xpert Xpress SARS-CoV-2/FLU/RSV plus assay is intended as an aid in the diagnosis of influenza from Nasopharyngeal swab specimens and should not be used as a sole basis for treatment. Nasal washings and aspirates are unacceptable for Xpert Xpress SARS-CoV-2/FLU/RSV testing.  Fact Sheet for Patients: EntrepreneurPulse.com.au  Fact Sheet for Healthcare Providers: IncredibleEmployment.be  This test is not yet approved or cleared by the [place] FDA and has been authorized for detection and/or diagnosis of SARS-CoV-2 by FDA under an Emergency Use Authorization (EUA). This EUA will remain in effect (meaning this test can be used) for the duration of the COVID-19 declaration under Section 564(b)(1) of the Act, 21 U.S.C. section 360bbb-3(b)(1), unless the authorization is terminated or revoked.  Performed at [facility], [address], [place], [place] [address]   Culture, respiratory (non-expectorated)      Status: None   Collection Time: 04/24/20 10:20 PM   Specimen: Tracheal Aspirate; Respiratory  Result Value Ref Range Status   Specimen Description   Final    TRACHEAL ASPIRATE Performed at [facility] [address], [place], [place] [address]    Special Requests   Final    NONE Performed at [facility], [place] [address], [place], [place] [address]    Gram Stain   Final    ABUNDANT WBC PRESENT, PREDOMINANTLY PMN ABUNDANT GRAM POSITIVE COCCI ABUNDANT GRAM POSITIVE RODS    Culture   Final    FEW Normal respiratory flora-no Staph aureus or Pseudomonas seen Performed at [facility] [address], [place], [place] [address]    Report Status 04/27/2020 FINAL  Final     Labs: BNP (last 3 results) Recent Labs    01/23/20 0234 03/10/20 1227 03/29/20 0515  BNP 50.0 183.0* 01.0   Basic Metabolic Panel: Recent Labs  Lab 04/26/20 0426 04/27/20 0415 04/28/20 0337 04/29/20 0530 04/30/20 0524  NA 136 137 136 136 138  K 4.5 4.2 4.7 5.5* 5.8*  CL 101 102 101 101 103  CO2 _0 GLUCOSE 93 92 165* 143* 120*  BUN 22* 18 15 22* 32*  CREATININE 1.56* 1.28* 1.25* 1.14* 1.15*  CALCIUM 8.0* 7.7* 8.4* 8.8* 9.0  MG 1.7 1.9 2.0 2.1 2.1   Liver Function Tests: Recent Labs  Lab 04/25/20 0810 04/26/20 0426  AST 12* 12*  ALT 10 9  ALKPHOS 38 37*  BILITOT 0.8 0.6  PROT 5.7* 5.3*  ALBUMIN 2.7* 2.5*   No results for input(s): LIPASE, AMYLASE in the last 168 hours. No results for input(s): AMMONIA in the last 168 hours. CBC: Recent Labs  Lab 04/25/20 0810  04/26/20 0426 04/27/20 1348 04/28/20 0337 04/29/20 0530 04/30/20 0524  WBC 4.8 4.4 5.0 7.0 7.4 6.8  NEUTROABS 3.5 3.0  --   --   --   --   HGB 9.5* 9.2* 9.7* 9.3* 9.7* 9.9*  HCT 29.8* 29.2* 30.3* 29.1* 30.4* 31.4*  MCV 99.3 100.0 99.0 99.3 98.7 100.0  PLT 137* 127* 151 141* 130* 121*   Cardiac Enzymes: No results for input(s): CKTOTAL, CKMB, CKMBINDEX, TROPONINI  in the last 168 hours. BNP: Invalid input(s): POCBNP CBG: Recent Labs  Lab 04/30/20 0013 04/30/20 0449 04/30/20 0749 04/30/20 1141 04/30/20 1609  GLUCAP 156* 116* 167* 165* 172*   D-Dimer No results for input(s): DDIMER in the last 72 hours. Hgb A1c No results for input(s): HGBA1C in the last 72 hours. Lipid Profile No results for input(s): CHOL, HDL, LDLCALC, TRIG, CHOLHDL, LDLDIRECT in the last 72 hours. Thyroid function studies No results for input(s): TSH, T4TOTAL, T3FREE, THYROIDAB in the last 72 hours.  Invalid input(s): FREET3 Anemia work up No results for input(s): VITAMINB12, FOLATE, FERRITIN, TIBC, IRON, RETICCTPCT in the last 72 hours. Urinalysis    Component Value Date/Time   COLORURINE YELLOW 01/13/2019 1730   APPEARANCEUR CLOUDY (A) 01/13/2019 1730   LABSPEC 1.017 01/13/2019 1730   PHURINE 8.0 01/13/2019 1730   GLUCOSEU NEGATIVE 01/13/2019 1730   HGBUR NEGATIVE 01/13/2019 1730   BILIRUBINUR NEGATIVE 01/13/2019 1730   KETONESUR NEGATIVE 01/13/2019 1730   PROTEINUR 100 (A) 01/13/2019 1730   UROBILINOGEN 0.2 09/25/2010 0530   NITRITE NEGATIVE 01/13/2019 1730   LEUKOCYTESUR NEGATIVE 01/13/2019 1730   Sepsis Labs Invalid input(s): PROCALCITONIN,  WBC,  LACTICIDVEN Microbiology Recent Results (from the past 240 hour(s))  Blood Culture (routine x 2)     Status: None   Collection Time: 04/24/20  3:34 PM   Specimen: Left Antecubital; Blood  Result Value Ref Range Status   Specimen Description LEFT ANTECUBITAL  Final   Special Requests   Final    BOTTLES DRAWN AEROBIC AND ANAEROBIC Blood Culture adequate volume   Culture   Final    NO GROWTH 5 DAYS Performed at [facility], [address] [place], [place] [address]    Report Status 04/29/2020 FINAL  Final  Blood Culture (routine x 2)     Status: None   Collection Time: 04/24/20  3:34 PM   Specimen: Right Antecubital; Blood  Result Value Ref Range Status   Specimen Description RIGHT ANTECUBITAL  Final    Special Requests   Final    BOTTLES DRAWN AEROBIC ONLY Blood Culture results may not be optimal due to an inadequate volume of blood received in culture bottles   Culture   Final    NO GROWTH 5 DAYS Performed at [facility], [address] [place],  [address]    Report Status 04/29/2020 FINAL  Final  Resp Panel by RT-PCR (Flu A&B, Covid) Nasopharyngeal Swab     Status: None   Collection Time: 04/24/20  3:35 PM   Specimen: Nasopharyngeal Swab; Nasopharyngeal(NP) swabs in vial transport medium  Result Value Ref Range Status   SARS Coronavirus 2 by RT PCR NEGATIVE NEGATIVE Final    Comment: (NOTE) SARS-CoV-2 target nucleic acids are NOT DETECTED.  The SARS-CoV-2 RNA is generally detectable in upper respiratory specimens during the acute phase of infection. The lowest concentration of SARS-CoV-2 viral copies this assay can detect is 138 copies/mL. A negative result does not preclude SARS-Cov-2 infection and should not be used as the sole basis  for treatment or other patient management decisions. A negative result may occur with  improper specimen collection/handling, submission of specimen other than nasopharyngeal swab, presence of viral mutation(s) within the areas targeted by this assay, and inadequate number of viral copies(<138 copies/mL). A negative result must be combined with clinical observations, patient history, and epidemiological information. The expected result is Negative.  Fact Sheet for Patients:  EntrepreneurPulse.com.au  Fact Sheet for Healthcare Providers:  IncredibleEmployment.be  This test is no t yet approved or cleared by the [place] FDA and  has been authorized for detection and/or diagnosis of SARS-CoV-2 by FDA under an Emergency Use Authorization (EUA). This EUA will remain  in effect (meaning this test can be used) for the duration of the COVID-19 declaration under Section 564(b)(1) of the Act,  21 U.S.C.section 360bbb-3(b)(1), unless the authorization is terminated  or revoked sooner.       Influenza A by PCR NEGATIVE NEGATIVE Final   Influenza B by PCR NEGATIVE NEGATIVE Final    Comment: (NOTE) The Xpert Xpress SARS-CoV-2/FLU/RSV plus assay is intended as an aid in the diagnosis of influenza from Nasopharyngeal swab specimens and should not be used as a sole basis for treatment. Nasal washings and aspirates are unacceptable for Xpert Xpress SARS-CoV-2/FLU/RSV testing.  Fact Sheet for Patients: EntrepreneurPulse.com.au  Fact Sheet for Healthcare Providers: IncredibleEmployment.be  This test is not yet approved or cleared by the [place] FDA and has been authorized for detection and/or diagnosis of SARS-CoV-2 by FDA under an Emergency Use Authorization (EUA). This EUA will remain in effect (meaning this test can be used) for the duration of the COVID-19 declaration under Section 564(b)(1) of the Act, 21 U.S.C. section 360bbb-3(b)(1), unless the authorization is terminated or revoked.  Performed at [facility], [address], [place], [place] [address]   Culture, respiratory (non-expectorated)     Status: None   Collection Time: 04/24/20 10:20 PM   Specimen: Tracheal Aspirate; Respiratory  Result Value Ref Range Status   Specimen Description   Final    TRACHEAL ASPIRATE Performed at [facility] [address] [place], [place] [address]    Special Requests   Final    NONE Performed at [facility], [place] [address] [place], [place] [address]    Gram Stain   Final    ABUNDANT WBC PRESENT, PREDOMINANTLY PMN ABUNDANT GRAM POSITIVE COCCI ABUNDANT GRAM POSITIVE RODS    Culture   Final    FEW Normal respiratory flora-no Staph aureus or Pseudomonas seen Performed at [facility] [address], [place], [place] [address]    Report Status 04/27/2020 FINAL  Final     Time  coordinating discharge: 39 minutes  SIGNED:   Georgette Shell, MD  [facility] 05/01/2020, 4:26 PM

## 2020-05-02 NOTE — Progress Notes (Deleted)
[facility] Progress Note   Veronica Horton [id number] 10/07/1960 60 y.o.  Veronica Horton is managed by Benay Pike, MD  Actively treated with chemotherapy/immunotherapy/hormonal therapy: yes  Current therapy: concurrent chemoradiation  Last treated: *** / *** / ***  Next scheduled appointment with provider: 05/11/2020  Assessment: Plan:    Chronic obstructive pulmonary disease, unspecified COPD type ([name])  Acute respiratory failure with hypoxia and hypercapnia ([place])  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[place])  Please see After Visit Summary for patient specific instructions.  Future Appointments  Date Time Provider [place]  05/03/2020 10:30 AM [facility] LAB [facility] None  05/03/2020 11:00 AM , Lucianne Lei E., PA-C [facility] None  05/03/2020 11:45 AM [facility] LINAC 4 [facility] None  05/03/2020  2:00 PM [facility]-SCREENING [facility]SDSC None  05/04/2020  7:30 AM [facility] INFUSION [facility] None  05/04/2020 11:45 AM [facility] [id number] [facility] None  05/04/2020 12:00 PM Jennet Maduro, RD [facility] None  05/05/2020 11:45 AM [facility] [id number] [facility] None  05/05/2020  2:00 PM [facility]-RESPTX TECH [facility]RESPTX None  05/06/2020 11:45 AM [facility] [id number] [facility] None  05/07/2020 11:45 AM [facility] [id number] [facility] None  05/10/2020 11:45 AM [facility] [id number] [facility] None  05/10/2020 12:00 PM [place] [facility] None  05/11/2020  7:30 AM [facility] LAB [facility] None  05/11/2020  8:00 AM Iruku, Praveena, MD [facility] None  05/11/2020  8:30 AM [facility] INFUSION [facility] None  05/11/2020 11:45 AM [facility] [id number] [facility] None  05/11/2020 12:00 PM Neff, Tanesha L, RD [facility] None  05/12/2020 11:45 AM [facility] [id number] [facility] None  05/13/2020 11:45 AM [facility] [id number] [facility] None  05/14/2020 11:45 AM [facility] [id number] [facility] None  05/17/2020 11:45 AM [facility] [id number]  [facility] None  05/18/2020  8:30 AM [facility] LAB [facility] None  05/18/2020  9:00 AM Iruku, Praveena, MD [facility] None  05/18/2020 10:00 AM [facility] INFUSION [facility] None  05/18/2020 11:45 AM [facility] [id number] [facility] None  05/18/2020 12:00 PM Neff, Mariame L, RD [facility] None    No orders of the defined types were placed in this encounter.      Subjective:   Patient ID:  Veronica Horton is a 60 y.o. (DOB [birth year]) female.  Chief Complaint: No chief complaint on file.   HPI Veronica Horton  is a 60 y.o. 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  Admit date: 04/24/2020 Discharge date: 05/01/2020  #1 sepsis secondary to aspiration pneumonia present on admission patient met criteria for sepsis with fever hypotension and hypoxia.  She was treated with Rocephin and Flagyl.  She was also seen by speech therapy.  She was tried on the PMV valve and she did well. Patient was adamant about being discharged and was refusing any further work-up.   #2 AKI improved with fluids.#3 history of laryngeal invasive carcinoma followed by oncology dr kale  #4 acute DVT -Doppler was positive for acute popliteal DVT in the right leg. Heparin on hold since CT scan of the head showed evidence of vasogenic edema in the cerebrum? MRI brain was ordered however patient refused to do it.  This was discussed with the patient and family multiple times by me several nursing staff.  I had scheduled to have MRI of the brain at [facility] under sedation but she refused to go.  She refused to have it done at [facility] with even Ativan on board.  Since I was not able to do an MRI of the brain to exclude metastatic lesions she was not sent out on anticoagulation for DVT.  #5 morbid obesity BMI of 44 risk for poor  outcome.  #6 abnormal CT soft tissue neck with potential vasogenic edema on the right posterior cerebral area.  She was discharged on Decadron.  Discussed with neurosurgery on-call advised to get MRI which she refused.  She will need to follow-up with neurosurgery on discharge.   #7 diarrhea likely antibiotics induced #8 depression we will start low-dose Lexapro   Medications: {medication reviewed/display:[id number]}  Allergies:  Allergies  Allergen Reactions  . Codeine Hives    Reports itching only per RN  . Penicillins Hives    Did it involve swelling of the face/tongue/throat, SOB, or low BP? No Did it involve sudden or severe rash/hives, skin peeling, or any reaction on the inside of your mouth or nose? Yes Did you need to seek medical attention at a hospital or doctor's office? Unknown When did it last happen?Over 10 years If all above answers are "NO", may proceed with cephalosporin use.     Past Medical History:  Diagnosis Date  . Bronchitis   . Class 3 obesity 01/23/2020  . COPD (chronic obstructive pulmonary disease) ([place])   . Degenerative disc disease, lumbar   . Hiatal hernia   . Hypertension     Past Surgical History:  Procedure Laterality Date  . BIOPSY  04/12/2020   Procedure: BIOPSY;  Surgeon: Wilford Corner, MD;  Location: [facility];  Service: Gastroenterology;;  . CHOLECYSTECTOMY    . degenerative bone disease    . DIRECT LARYNGOSCOPY N/A 03/13/2020   Procedure: DIRECT LARYNGOSCOPY WITH BIOPSY;  Surgeon: Jason Coop, DO;  Location: [facility];  Service: ENT;  Laterality: N/A;  . FLEXIBLE SIGMOIDOSCOPY N/A 04/12/2020   Procedure: FLEXIBLE SIGMOIDOSCOPY;  Surgeon: Wilford Corner, MD;  Location: [facility];  Service: Gastroenterology;  Laterality: N/A;  . INCISIONAL HERNIA REPAIR N/A 01/15/2019   Procedure: [name] HERNIORRHAPHY WITH MESH;  Surgeon: [name], MD;  Location: [facility];  Service: General;  Laterality: N/A;  . IR  GASTROSTOMY TUBE MOD SED  03/22/2020  . OMENTECTOMY N/A 01/15/2019   Procedure: OMENTECTOMY;  Surgeon: [name], MD;  Location: [facility];  Service: General;  Laterality: N/A;  . POLYPECTOMY  04/12/2020   Procedure: POLYPECTOMY;  Surgeon: Wilford Corner, MD;  Location: [facility];  Service: Gastroenterology;;  . TRACHEOSTOMY TUBE PLACEMENT N/A 03/13/2020   Procedure: AWAKE TRACHEOSTOMY;  Surgeon: Jason Coop, DO;  Location: [facility];  Service: ENT;  Laterality: N/A;    Family History  Problem Relation Age of Onset  . Hypertension Mother   . Sudden Cardiac Death Neg Hx     Social History   Socioeconomic History  . Marital status: Divorced    Spouse name: Not on file  . Number of children: Not on file  . Years of education: Not on file  . Highest education level: Not on file  Occupational History  . Not on file  Tobacco Use  . Smoking status: Current Every Day Smoker    Packs/day: 1.00  . Smokeless tobacco: Never Used  Substance and Sexual Activity  . Alcohol use: No  . Drug use: No  . Sexual activity: Yes    Birth control/protection: Post-menopausal  Other Topics Concern  . Not on file  Social History Narrative  . Not on file   Social Determinants of Health   Financial  Resource Strain: Not on file  Food Insecurity: Not on file  Transportation Needs: Not on file  Physical Activity: Not on file  Stress: Not on file  Social Connections: Not on file  Intimate Partner Violence: Not on file    Past Medical History, Surgical history, Social history, and Family history were reviewed and updated as appropriate.   Please see review of systems for further details on the patient's review from today.   Review of Systems:  Review of Systems  Objective:   Physical Exam:  There were no vitals taken for this visit. ECOG: ***  Physical Exam  Lab Review:     Component Value Date/Time   NA 138 04/30/2020 0524   K 5.8 (H) 04/30/2020 0524   CL 103 04/30/2020  0524   CO2 26 04/30/2020 0524   GLUCOSE 120 (H) 04/30/2020 0524   BUN 32 (H) 04/30/2020 0524   CREATININE 1.15 (H) 04/30/2020 0524   CALCIUM 9.0 04/30/2020 0524   PROT 5.3 (L) 04/26/2020 0426   ALBUMIN 2.5 (L) 04/26/2020 0426   AST 12 (L) 04/26/2020 0426   ALT 9 04/26/2020 0426   ALKPHOS 37 (L) 04/26/2020 0426   BILITOT 0.6 04/26/2020 0426   GFRNONAA 55 (L) 04/30/2020 0524   GFRAA >60 01/24/2020 0425       Component Value Date/Time   WBC 6.8 04/30/2020 0524   RBC 3.14 (L) 04/30/2020 0524   HGB 9.9 (L) 04/30/2020 0524   HCT 31.4 (L) 04/30/2020 0524   PLT 121 (L) 04/30/2020 0524   MCV 100.0 04/30/2020 0524   MCH 31.5 04/30/2020 0524   MCHC 31.5 04/30/2020 0524   RDW 16.7 (H) 04/30/2020 0524   LYMPHSABS 0.5 (L) 04/26/2020 0426   MONOABS 0.7 04/26/2020 0426   EOSABS 0.0 04/26/2020 0426   BASOSABS 0.0 04/26/2020 0426   -------------------------------  Imaging from last 24 hours (if applicable):  Radiology interpretation: CT ABDOMEN PELVIS WO CONTRAST  Result Date: 04/09/2020 CLINICAL DATA:  Intermittent lower quadrant abdominal pain, diminished bowel movements over past 3 days, fever, history COPD, hypertension, hiatal hernia EXAM: CT ABDOMEN AND PELVIS WITHOUT CONTRAST TECHNIQUE: Multidetector CT imaging of the abdomen and pelvis was performed following the standard protocol without IV contrast. Sagittal and coronal MPR images reconstructed from axial data set. COMPARISON:  03/15/2020 FINDINGS: Lower chest: Bibasilar consolidation, volume loss and pleural effusions Hepatobiliary: Gallbladder surgically absent. Liver normal appearance Pancreas: Atrophic, otherwise unremarkable Spleen: Normal appearance Adrenals/Urinary Tract: LEFT renal cysts unchanged. Adrenal glands, kidneys, ureters, and bladder otherwise normal appearance Stomach/Bowel: Scattered radiopacities within stool. Normal appendix. Gastrostomy tube in stomach. Mild diverticulosis of distal descending and proximal  sigmoid colon without evidence of diverticulitis. Significant rectal wall thickening with surrounding perirectal infiltrative changes extending into presacral space favor proctitis though tumor not excluded. Prior ventral hernia repair with recurrent hernia identified at the inferior margin of the repair containing a nonobstructed small bowel loop (see coronal images 81-82). Vascular/Lymphatic: Atherosclerotic calcifications aorta and iliac arteries without aneurysm. No adenopathy. Reproductive: Unremarkable uterus and adnexa Other: Infiltration of fat within small bowel mesentery consistent with fibrosing mesenteritis. No free air or free fluid. Musculoskeletal: Osseous demineralization. IMPRESSION: Distal colonic diverticulosis without evidence of diverticulitis. Significant rectal wall thickening with surrounding perirectal infiltrative changes extending into presacral space favor proctitis though tumor not excluded; recommend correlation with proctoscopy. Prior ventral hernia repair with recurrent hernia at the inferior margin of the repair, containing a nonobstructed small bowel loop. Changes of fibrosing mesenteritis. Bibasilar consolidation, volume loss  and pleural effusions. Aortic Atherosclerosis (ICD10-I70.0). Electronically Signed   By: Lavonia Dana M.D.   On: 04/09/2020 13:27   DG Chest 1 View  Result Date: 04/29/2020 CLINICAL DATA:  Possible aspiration pneumonia. EXAM: CHEST  1 VIEW COMPARISON:  April 24, 2020. FINDINGS: Stable cardiomegaly. Tracheostomy tube is unchanged in position. No pneumothorax is noted. Increased right middle lobe opacity is noted concerning for atelectasis or infiltrate. Stable left basilar atelectasis or infiltrate is noted. Bony thorax is unremarkable. IMPRESSION: Increased right middle lobe opacity is noted concerning for atelectasis or infiltrate. Stable left basilar atelectasis or infiltrate is noted. Electronically Signed   By: Marijo Conception M.D.   On: 04/29/2020  13:23   CT HEAD WO CONTRAST  Result Date: 04/27/2020 CLINICAL DATA:  Cerebral edema on heparin drip. EXAM: CT HEAD WITHOUT CONTRAST TECHNIQUE: Contiguous axial images were obtained from the base of the skull through the vertex without intravenous contrast. COMPARISON:  No head CT for comparison FINDINGS: Brain: No midline shift. Signs of vasogenic edema with mass along the high RIGHT parietal region which may be extra-axial measuring approximately 3.7 x 3.0 x 2.3 cm. Extensive vasogenic edema in the RIGHT occipital and parietal lobe with mild mass effect upon the posterior horn of the lateral ventricle but without signs of hemorrhage. Mass appears to have areas of calcification. No hydrocephalus or abnormal extra-axial fluid. Vascular: No hyperdense vessel or unexpected calcification. Skull: Normal. Negative for fracture or focal lesion. Sinuses/Orbits: Air-fluid level in the LEFT sphenoid sinus. Mild mucosal thickening of LEFT sphenoid. Orbits are unremarkable. Other: None. IMPRESSION: 1. Extensive vasogenic edema in the RIGHT parietal/occipital region associated with what is likely an extra-axial mass with calcification. Meningioma is favored. Metastatic disease while considered is felt less likely. MRI with and without contrast is suggested for further assessment. 2. Mild mass effect upon the RIGHT lateral ventricle, posterior horn, no signs of midline shift. 3. No signs of intracranial hemorrhage or abnormal extra-axial fluid. Electronically Signed   By: Zetta Bills M.D.   On: 04/27/2020 19:56   CT SOFT TISSUE NECK WO CONTRAST  Result Date: 04/27/2020 CLINICAL DATA:  Obstructive dysphagia, laryngeal squamous cell carcinoma post tracheostomy EXAM: CT NECK WITHOUT CONTRAST TECHNIQUE: Multidetector CT imaging of the neck was performed following the standard protocol without intravenous contrast. COMPARISON:  03/11/2020 FINDINGS: Pharynx and larynx: Abnormal soft tissue thickening involving the  supraglottic larynx and hypopharynx as seen previously with paraglottic extension. True cords may be spared but submucosal involvement is not excluded. Cartilage involvement as previously described. Measurement is difficult but there has been a decrease in size with improved visualization of the airway. Salivary glands: Unremarkable. Thyroid: Unremarkable. Lymph nodes: Suboptimal evaluation in the absence of intravenous contrast. Abnormal left level 3 node (axial series 2, image 78) is similar in size. Left supraclavicular node on image 89 has increased in size measuring 1.5 x 0.9 cm (previously 1.2 x 0.7 cm). Vascular: No significant abnormality on this noncontrast study. Limited intracranial: Hypoattenuation in the right posterior cerebral white matter. There is mild mass effect. Partially imaged focus of mineralization at the superior margin of this region. Visualized orbits: Unremarkable. Mastoids and visualized paranasal sinuses: Mild paranasal sinus mucosal thickening. Minimal right mastoid opacification. Skeleton: No acute osseous abnormality. Upper chest: Refer to dedicated chest imaging. Other: None. IMPRESSION: Similar extent but decreased bulkiness of supraglottic tumor with improved visualization of airway. Similar size of enlarged left level 3 node. Increase in size of left supraclavicular node. Hypoattenuation in the  right posterior cerebral white matter. This was not imaged on the prior study and may reflect vasogenic edema. MRI with contrast recommended. These results will be called to the ordering clinician or representative by the Radiologist Assistant, and communication documented in the PACS or [employer]. Electronically Signed   By: Macy Mis M.D.   On: 04/27/2020 15:22   CT CHEST WO CONTRAST  Result Date: 04/27/2020 CLINICAL DATA:  Pneumonia. EXAM: CT CHEST WITHOUT CONTRAST TECHNIQUE: Multidetector CT imaging of the chest was performed following the standard protocol without IV  contrast. COMPARISON:  April 13, 2020. FINDINGS: Cardiovascular: No evidence of thoracic aortic aneurysm. Mild cardiomegaly is noted. No pericardial effusion. Mediastinum/Nodes: Tracheostomy tube is in good position. The esophagus is unremarkable. No significant adenopathy is noted. Thyroid gland is unremarkable. Lungs/Pleura: No pneumothorax is noted. Small bilateral pleural effusions are noted with adjacent subsegmental atelectasis or infiltrates. Increased patchy opacities are noted in both upper lobes concerning for multifocal pneumonia. Upper Abdomen: No acute abnormality. Musculoskeletal: No chest wall mass or suspicious bone lesions identified. IMPRESSION: 1. Small bilateral pleural effusions are noted with adjacent subsegmental atelectasis or infiltrates. 2. Increased patchy opacities are noted in both upper lobes concerning for multifocal pneumonia. 3. Tracheostomy tube is in good position. Electronically Signed   By: Marijo Conception M.D.   On: 04/27/2020 15:10   CT CHEST WO CONTRAST  Result Date: 04/13/2020 CLINICAL DATA:  Pleural effusion EXAM: CT CHEST WITHOUT CONTRAST TECHNIQUE: Multidetector CT imaging of the chest was performed following the standard protocol without IV contrast. COMPARISON:  03/15/2020 FINDINGS: Cardiovascular: Extensive coronary artery calcification again noted predominantly within the left anterior descending coronary artery. Global cardiac size within normal limits. No pericardial effusion. Central pulmonary arteries are enlarged in keeping with changes of pulmonary arterial hypertension, similar to that noted on prior examination. Mild atherosclerotic calcification is seen within the thoracic aorta. Aberrant right subclavian artery noted. Mediastinum/Nodes: Tracheostomy in expected position. No pathologic thoracic adenopathy. The esophagus is unremarkable. Lungs/Pleura: Patchy pulmonary infiltrate is seen within the a left upper lobe, new since prior examination and  likely infectious in the acute setting. Bibasilar atelectasis is present with subtotal collapse of the lower lobes bilaterally. Small bilateral pleural effusions are present, left greater than right. No pneumothorax. No central obstructing lesion. Upper Abdomen: Cholecystectomy has been performed. No acute abnormality. Musculoskeletal: No lytic or blastic bone lesions are seen. Multiple remote left rib fractures are noted no acute bone abnormality. IMPRESSION: Small bilateral pleural effusions with associated bibasilar atelectasis. Patchy airspace infiltrate within the left upper lobe, likely infectious in the acute setting. No central obstructing lesion. Extensive coronary artery calcification. Morphologic changes in keeping with pulmonary arterial hypertension. Aortic Atherosclerosis (ICD10-I70.0). Electronically Signed   By: Fidela Salisbury MD   On: 04/13/2020 20:19   CT ABDOMEN PELVIS W CONTRAST  Result Date: 04/24/2020 CLINICAL DATA:  Abdominal pain, fever, for surge EXAM: CT ABDOMEN AND PELVIS WITH CONTRAST TECHNIQUE: Multidetector CT imaging of the abdomen and pelvis was performed using the standard protocol following bolus administration of intravenous contrast. CONTRAST:  6m OMNIPAQUE IOHEXOL 300 MG/ML  SOLN COMPARISON:  04/09/2020 FINDINGS: Lower chest: Bandlike scarring and atelectasis of the dependent bilateral lung bases. Multiple nonacute fractures of the posterior left ribs. Hepatobiliary: No focal liver abnormality is seen. Status post cholecystectomy. No biliary dilatation. Pancreas: Unremarkable. No pancreatic ductal dilatation or surrounding inflammatory changes. Spleen: Normal in size without significant abnormality. Adrenals/Urinary Tract: Adrenal glands are unremarkable. Kidneys are normal, without renal  calculi, solid lesion, or hydronephrosis. Bladder is unremarkable. Stomach/Bowel: Percutaneous gastrostomy, balloon within the gastric lumen. Appendix appears normal. No evidence of bowel  wall thickening, distention, or inflammatory changes. Vascular/Lymphatic: Aortic atherosclerosis. No enlarged abdominal or pelvic lymph nodes. Reproductive: No mass or other significant abnormality. Other: Broad-based midline ventral abdominal hernia, status post mesh repair. Redemonstrated recurrent inferior hernia component, containing a single, nonobstructed loop of mid small bowel (series 2, image 62). No abdominopelvic ascites. Musculoskeletal: No acute or significant osseous findings. IMPRESSION: 1. No acute CT findings of the abdomen or pelvis to explain abdominal pain. No evidence infectious nidus internal to the abdomen or pelvis. 2. Previously identified perirectal fat stranding is resolved, consistent with resolution of infectious or inflammatory proctitis. 3. Broad-based midline ventral abdominal hernia, status post mesh repair. Redemonstrated recurrent inferior hernia component, containing a single, nonobstructed loop of mid small bowel. Aortic Atherosclerosis (ICD10-I70.0). Electronically Signed   By: Eddie Candle M.D.   On: 04/24/2020 17:05   DG Chest Port 1 View  Result Date: 04/24/2020 CLINICAL DATA:  Questionable sepsis EXAM: PORTABLE CHEST 1 VIEW COMPARISON:  04/11/2020, CT chest, 04/13/2020 FINDINGS: Cardiomegaly. Tracheostomy. Bandlike scarring or atelectasis of the right midlung. There is a background of diffuse, coarse appearing interstitial opacity. No acute appearing airspace opacity. IMPRESSION: 1.  Cardiomegaly. 2.  Tracheostomy. 3. Bandlike scarring or atelectasis of the right midlung. There is a background of diffuse, coarse appearing interstitial opacity, generally corresponding to scarring and atelectasis seen on prior CT. No acute appearing airspace opacity. Electronically Signed   By: Eddie Candle M.D.   On: 04/24/2020 15:23   DG CHEST PORT 1 VIEW  Result Date: 04/11/2020 CLINICAL DATA:  Hypoxia. EXAM: PORTABLE CHEST 1 VIEW COMPARISON:  April 08, 2020. FINDINGS: Stable  cardiomediastinal silhouette. Tracheostomy tube is unchanged. No pneumothorax is noted. Stable left pleural effusion is noted with associated left basilar atelectasis or infiltrate. Right midlung subsegmental atelectasis is noted with probable small right pleural effusion. Bony thorax is unremarkable. IMPRESSION: Stable left pleural effusion with associated left basilar atelectasis or infiltrate. Right midlung subsegmental atelectasis is noted with probable small right pleural effusion. Electronically Signed   By: Marijo Conception M.D.   On: 04/11/2020 13:20   DG CHEST PORT 1 VIEW  Result Date: 04/08/2020 CLINICAL DATA:  Respiratory failure. EXAM: PORTABLE CHEST 1 VIEW COMPARISON:  April 03, 2020 FINDINGS: There is stable tracheostomy tube positioning. Mild, diffuse chronic appearing increased interstitial lung markings are seen. Mild to moderate severity atelectasis and/or infiltrate is seen within the right lung base. This is similar in severity when compared to the prior study. Marked severity left basilar atelectasis and/or infiltrate is seen and is increased in severity when compared to the prior exam. There is no evidence of a pleural effusion or pneumothorax. There is mild to moderate severity enlargement of the cardiac silhouette. Degenerative changes are seen throughout the thoracic spine. IMPRESSION: 1. Bibasilar atelectasis and/or infiltrate, left greater than right. Electronically Signed   By: Virgina Norfolk M.D.   On: 04/08/2020 16:45   DG CHEST PORT 1 VIEW  Result Date: 04/03/2020 CLINICAL DATA:  Increasing shortness of breath EXAM: PORTABLE CHEST 1 VIEW COMPARISON:  03/31/2020 FINDINGS: Tracheostomy tube is noted in satisfactory position. Cardiac shadow is enlarged but stable. Some persistent bibasilar opacities are noted although mildly improved when compared with the prior exam. No new focal abnormality is noted. IMPRESSION: Improved aeration as described. Electronically Signed   By:  Linus Mako.D.  On: 04/03/2020 11:59   DG Abd Portable 1V  Result Date: 04/13/2020 CLINICAL DATA:  Abdominal pain EXAM: PORTABLE ABDOMEN - 1 VIEW COMPARISON:  CT abdomen and pelvis April 09, 2020; abdominal radiograph April 07, 2020 FINDINGS: Gastrostomy catheter overlying stomach region. Moderate stool in colon. No bowel dilatation or air-fluid level to suggest bowel obstruction. No free air. Postoperative change left pelvic region. Loculated effusion left base. IMPRESSION: No bowel obstruction or free air. Moderate stool in colon. Loculated pleural effusion lateral left base. Electronically Signed   By: Lowella Grip III M.D.   On: 04/13/2020 16:14   DG Abd Portable 1V  Result Date: 04/07/2020 CLINICAL DATA:  Abdomen pain EXAM: PORTABLE ABDOMEN - 1 VIEW COMPARISON:  CT 03/15/2020 FINDINGS: Gastrostomy tube in the left mid abdomen. Nonobstructed bowel-gas pattern. Evidence of prior hernia repair. Curvilinear densities in the right lower quadrant, possible calcifications or retained contrast within appendix. IMPRESSION: Nonobstructed gas pattern. Electronically Signed   By: Donavan Foil M.D.   On: 04/07/2020 21:16   DG Swallowing Func-Speech Pathology  Result Date: 04/26/2020 Objective Swallowing Evaluation: Type of Study: MBS-Modified Barium Swallow Study  Patient Details Name: NICHOLA CIESLINSKI MRN: [id number] Date of Birth: 1960/12/22 Today's Date: 04/26/2020 Time: SLP Start Time (ACUTE ONLY): 6468 -SLP Stop Time (ACUTE ONLY): 1901 SLP Time Calculation (min) (ACUTE ONLY): 11 min Past Medical History: Past Medical History: Diagnosis Date . Bronchitis  . Class 3 obesity 01/23/2020 . COPD (chronic obstructive pulmonary disease) ([place])  . Degenerative disc disease, lumbar  . Hiatal hernia  . Hypertension  Past Surgical History: Past Surgical History: Procedure Laterality Date . BIOPSY  04/12/2020  Procedure: BIOPSY;  Surgeon: Wilford Corner, MD;  Location: [facility];  Service:  Gastroenterology;; . CHOLECYSTECTOMY   . degenerative bone disease   . DIRECT LARYNGOSCOPY N/A 03/13/2020  Procedure: DIRECT LARYNGOSCOPY WITH BIOPSY;  Surgeon: Jason Coop, DO;  Location: [facility];  Service: ENT;  Laterality: N/A; . FLEXIBLE SIGMOIDOSCOPY N/A 04/12/2020  Procedure: FLEXIBLE SIGMOIDOSCOPY;  Surgeon: Wilford Corner, MD;  Location: [facility];  Service: Gastroenterology;  Laterality: N/A; . INCISIONAL HERNIA REPAIR N/A 01/15/2019  Procedure: [name] HERNIORRHAPHY WITH MESH;  Surgeon: [name], MD;  Location: [facility];  Service: General;  Laterality: N/A; . IR GASTROSTOMY TUBE MOD SED  03/22/2020 . OMENTECTOMY N/A 01/15/2019  Procedure: OMENTECTOMY;  Surgeon: [name], MD;  Location: [facility];  Service: General;  Laterality: N/A; . POLYPECTOMY  04/12/2020  Procedure: POLYPECTOMY;  Surgeon: Wilford Corner, MD;  Location: [facility];  Service: Gastroenterology;; . TRACHEOSTOMY TUBE PLACEMENT N/A 03/13/2020  Procedure: AWAKE TRACHEOSTOMY;  Surgeon: Jason Coop, DO;  Location: [facility];  Service: ENT;  Laterality: N/A; HPI: Veronica Horton is a 60 y/o F with history of COPD,chronic respiratory failure on home oxygen at 5 L/min, laryngeal cancer s/p tracheostomy, PEG and is undergoing XRT/chemo.  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  Prior medical history includes smoking and some dyspnea if walking long distanced.  She has been on a dys3/thin diet and follow up indicated to provide information including exercises etc for potential side effects of XRT/chemo.  Pt has been maintained on a dys3/thin diet and tube feeding. Readmitted with pna - concern for aspiration.  Subjective: pt awake in chair Assessment / Plan / Recommendation CHL IP CLINICAL IMPRESSIONS 04/26/2020 Clinical Impression -- SLP Visit Diagnosis Dysphagia, pharyngoesophageal phase (R13.14) Attention and concentration deficit following -- Frontal lobe and executive function deficit following -- Impact on safety and  function --  CHL IP TREATMENT RECOMMENDATION 04/26/2020 Treatment Recommendations Therapy as outlined in treatment plan below   Prognosis 04/26/2020 Prognosis for Safe Diet Advancement Good Barriers to Reach Goals -- Barriers/Prognosis Comment -- CHL IP DIET RECOMMENDATION 04/26/2020 SLP Diet Recommendations -- Liquid Administration via -- Medication Administration -- Compensations Slow rate;Small sips/bites Postural Changes --   CHL IP OTHER RECOMMENDATIONS 04/26/2020 Recommended Consults -- Oral Care Recommendations Oral care BID Other Recommendations Clarify dietary restrictions;Have oral suction available   CHL IP FOLLOW UP RECOMMENDATIONS 04/26/2020 Follow up Recommendations Skilled Nursing facility   [facility] IP FREQUENCY AND DURATION 04/26/2020 Speech Therapy Frequency (ACUTE ONLY) min 1 x/week Treatment Duration 1 week      CHL IP ORAL PHASE 04/26/2020 Oral Phase WFL Oral - Pudding Teaspoon -- Oral - Pudding Cup -- Oral - Honey Teaspoon -- Oral - Honey Cup -- Oral - Nectar Teaspoon -- Oral - Nectar Cup -- Oral - Nectar Straw -- Oral - Thin Teaspoon -- Oral - Thin Cup -- Oral - Thin Straw -- Oral - Puree -- Oral - Mech Soft -- Oral - Regular -- Oral - Multi-Consistency -- Oral - Pill -- Oral Phase - Comment --  CHL IP PHARYNGEAL PHASE 04/26/2020 Pharyngeal Phase Impaired Pharyngeal- Pudding Teaspoon -- Pharyngeal -- Pharyngeal- Pudding Cup -- Pharyngeal -- Pharyngeal- Honey Teaspoon -- Pharyngeal -- Pharyngeal- Honey Cup -- Pharyngeal -- Pharyngeal- Nectar Teaspoon Reduced airway/laryngeal closure;Pharyngeal residue - valleculae Pharyngeal Material does not enter airway Pharyngeal- Nectar Cup -- Pharyngeal -- Pharyngeal- Nectar Straw Reduced airway/laryngeal closure;Reduced epiglottic inversion;Penetration/Aspiration during swallow;Penetration/Apiration after swallow;Trace aspiration Pharyngeal Material enters airway, passes BELOW cords then ejected out;Material enters airway, remains ABOVE vocal cords and not  ejected out Pharyngeal- Thin Teaspoon Reduced epiglottic inversion;Reduced airway/laryngeal closure Pharyngeal -- Pharyngeal- Thin Cup Reduced epiglottic inversion;Reduced airway/laryngeal closure;Penetration/Aspiration during swallow;Penetration/Apiration after swallow;Trace aspiration Pharyngeal Material enters airway, remains ABOVE vocal cords then ejected out;Material enters airway, passes BELOW cords then ejected out Pharyngeal- Thin Straw Penetration/Aspiration during swallow;Penetration/Apiration after swallow;Trace aspiration;Reduced epiglottic inversion Pharyngeal Material enters airway, passes BELOW cords then ejected out Pharyngeal- Puree Reduced epiglottic inversion;Pharyngeal residue - valleculae Pharyngeal -- Pharyngeal- Mechanical Soft Reduced epiglottic inversion;Pharyngeal residue - valleculae Pharyngeal Material does not enter airway Pharyngeal- Regular NT Pharyngeal -- Pharyngeal- Multi-consistency -- Pharyngeal -- Pharyngeal- Pill NT Pharyngeal -- Pharyngeal Comment --  CHL IP CERVICAL ESOPHAGEAL PHASE 04/26/2020 Cervical Esophageal Phase -- Pudding Teaspoon -- Pudding Cup -- Honey Teaspoon -- Honey Cup -- Nectar Teaspoon -- Nectar Cup -- Nectar Straw -- Thin Teaspoon -- Thin Cup Prominent cricopharyngeal segment Thin Straw -- Puree -- Mechanical Soft -- Regular -- Multi-consistency -- Pill -- Cervical Esophageal Comment no backflow observed during SLP MBS [name], MS [facility] SLP Acute Rehab Services Office [phone] Pager [phone] [name] 04/26/2020, 7:41 PM              VAS [place] LOWER EXTREMITY VENOUS (DVT)  Result Date: 04/26/2020  Lower Venous DVT Study Indications: Edema.  Risk Factors: Cancer. Limitations: Body habitus and poor ultrasound/tissue interface. Comparison Study: No prior studies. Performing Technologist: Oliver Hum RVT  Examination Guidelines: A complete evaluation includes B-mode imaging, spectral Doppler, color Doppler, and power Doppler as needed of all  accessible portions of each vessel. Bilateral testing is considered an integral part of a complete examination. Limited examinations for reoccurring indications may be performed as noted. The reflux portion of the exam is performed with the patient in reverse Trendelenburg.  +---------+---------------+---------+-----------+----------+-------------------+ RIGHT    CompressibilityPhasicitySpontaneityPropertiesThrombus Aging      +---------+---------------+---------+-----------+----------+-------------------+  CFV      Full           Yes      Yes                                      +---------+---------------+---------+-----------+----------+-------------------+ SFJ      Full                                                             +---------+---------------+---------+-----------+----------+-------------------+ FV Prox  Full                                                             +---------+---------------+---------+-----------+----------+-------------------+ FV Mid   Full                                                             +---------+---------------+---------+-----------+----------+-------------------+ FV Distal               Yes      Yes                                      +---------+---------------+---------+-----------+----------+-------------------+ PFV      Full                                                             +---------+---------------+---------+-----------+----------+-------------------+ POP      None           No       No                   Acute               +---------+---------------+---------+-----------+----------+-------------------+ PTV      Full                                                             +---------+---------------+---------+-----------+----------+-------------------+ PERO                                                  Not well visualized  +---------+---------------+---------+-----------+----------+-------------------+   +---------+---------------+---------+-----------+----------+-------------------+ LEFT     CompressibilityPhasicitySpontaneityPropertiesThrombus Aging      +---------+---------------+---------+-----------+----------+-------------------+ CFV      Full  Yes      Yes                                      +---------+---------------+---------+-----------+----------+-------------------+ SFJ      Full                                                             +---------+---------------+---------+-----------+----------+-------------------+ FV Prox  Full                                                             +---------+---------------+---------+-----------+----------+-------------------+ FV Mid   Full                                                             +---------+---------------+---------+-----------+----------+-------------------+ FV Distal               Yes      Yes                                      +---------+---------------+---------+-----------+----------+-------------------+ PFV      Full                                                             +---------+---------------+---------+-----------+----------+-------------------+ POP      Full           Yes      Yes                                      +---------+---------------+---------+-----------+----------+-------------------+ PTV      Full                                                             +---------+---------------+---------+-----------+----------+-------------------+ PERO                                                  Not well visualized +---------+---------------+---------+-----------+----------+-------------------+     Summary: RIGHT: - Findings consistent with acute deep vein thrombosis involving the right popliteal vein. - No cystic structure found in the popliteal fossa.   LEFT: - There is no evidence of deep vein thrombosis in the lower  extremity. However, portions of this examination were limited- see technologist comments above.  - No cystic structure found in the popliteal fossa.  *See table(s) above for measurements and observations. Electronically signed by Ruta Hinds MD on 04/26/2020 at 5:22:14 PM.    Final    VAS [place] UPPER EXTREMITY VENOUS DUPLEX  Result Date: 04/16/2020 UPPER VENOUS STUDY  Indications: Swelling, and Pain Comparison Study: no prior Performing Technologist: Abram Sander RVS  Examination Guidelines: A complete evaluation includes B-mode imaging, spectral Doppler, color Doppler, and power Doppler as needed of all accessible portions of each vessel. Bilateral testing is considered an integral part of a complete examination. Limited examinations for reoccurring indications may be performed as noted.  Right Findings: +----------+------------+---------+-----------+----------+-----------------+ RIGHT     CompressiblePhasicitySpontaneousProperties     Summary      +----------+------------+---------+-----------+----------+-----------------+ IJV           Full       Yes       Yes                                +----------+------------+---------+-----------+----------+-----------------+ Subclavian    Full       Yes       Yes                                +----------+------------+---------+-----------+----------+-----------------+ Axillary      Full       Yes       Yes                                +----------+------------+---------+-----------+----------+-----------------+ Brachial      Full       Yes       Yes                                +----------+------------+---------+-----------+----------+-----------------+ Radial        Full                                                    +----------+------------+---------+-----------+----------+-----------------+ Ulnar         Full                                                     +----------+------------+---------+-----------+----------+-----------------+ Cephalic      None                                  Age Indeterminate +----------+------------+---------+-----------+----------+-----------------+ Basilic       None                                  Age Indeterminate +----------+------------+---------+-----------+----------+-----------------+  Summary:  Right: No evidence of deep vein thrombosis in the upper extremity. Findings consistent with age indeterminate superficial vein thrombosis involving the right cephalic vein and right basilic vein.  *  See table(s) above for measurements and observations.  Diagnosing physician: Monica Martinez MD Electronically signed by Monica Martinez MD on 04/16/2020 at 4:45:55 PM.    Final

## 2020-05-03 ENCOUNTER — Ambulatory Visit: Payer: Medicaid Other

## 2020-05-03 ENCOUNTER — Telehealth: Payer: Self-pay | Admitting: Medical

## 2020-05-03 ENCOUNTER — Inpatient Hospital Stay: Payer: Medicaid Other | Admitting: Medical

## 2020-05-03 ENCOUNTER — Inpatient Hospital Stay: Payer: Medicaid Other

## 2020-05-03 ENCOUNTER — Telehealth: Payer: Self-pay | Admitting: *Deleted

## 2020-05-03 ENCOUNTER — Other Ambulatory Visit (HOSPITAL_COMMUNITY): Payer: Medicaid Other

## 2020-05-03 DIAGNOSIS — C321 Malignant neoplasm of supraglottis: Secondary | ICD-10-CM

## 2020-05-03 DIAGNOSIS — J449 Chronic obstructive pulmonary disease, unspecified: Secondary | ICD-10-CM

## 2020-05-03 DIAGNOSIS — J9601 Acute respiratory failure with hypoxia: Secondary | ICD-10-CM

## 2020-05-03 NOTE — Telephone Encounter (Signed)
Patients sister called needing advice on name of OTC medication for diarrhea.  States patient has been having diarrhea since she was discharged from the hospital.  Recommended Imodium OTC.  Advised that if no improvement by tomorrow or first thing Wednesday to let us know.  She was hospitalized and on antibiotics and further investigation into her loose stools might be warranted.  Encouraged fluid intake while experiencing diarrhea.  No other symptoms were noted by the sister at this time.

## 2020-05-03 NOTE — Telephone Encounter (Signed)
Patient's assistant stated she would like to reschedule appointments for today and tomorrow. Informed patient we would give her a call back with availability.

## 2020-05-04 ENCOUNTER — Inpatient Hospital Stay: Payer: Medicaid Other

## 2020-05-04 ENCOUNTER — Ambulatory Visit: Payer: Medicaid Other

## 2020-05-05 ENCOUNTER — Ambulatory Visit
Admission: RE | Admit: 2020-05-05 | Discharge: 2020-05-05 | Disposition: A | Discharge status: Home / Self Care | Payer: Medicaid Other | Source: Ambulatory Visit | Attending: Radiation Oncology | Admitting: Radiation Oncology

## 2020-05-05 ENCOUNTER — Telehealth: Payer: Self-pay | Admitting: *Deleted

## 2020-05-05 ENCOUNTER — Other Ambulatory Visit: Payer: Self-pay | Admitting: Acute Care

## 2020-05-05 ENCOUNTER — Other Ambulatory Visit: Payer: Self-pay

## 2020-05-05 ENCOUNTER — Telehealth: Payer: Self-pay | Admitting: Student

## 2020-05-05 ENCOUNTER — Ambulatory Visit (HOSPITAL_COMMUNITY)
Admission: RE | Admit: 2020-05-05 | Discharge: 2020-05-05 | Disposition: A | Discharge status: Home / Self Care | Payer: Medicaid Other | Source: Ambulatory Visit | Attending: Acute Care | Admitting: Acute Care

## 2020-05-05 DIAGNOSIS — R059 Cough, unspecified: Secondary | ICD-10-CM | POA: Insufficient documentation

## 2020-05-05 DIAGNOSIS — J9610 Chronic respiratory failure, unspecified whether with hypoxia or hypercapnia: Secondary | ICD-10-CM | POA: Insufficient documentation

## 2020-05-05 DIAGNOSIS — Z930 Tracheostomy status: Secondary | ICD-10-CM

## 2020-05-05 DIAGNOSIS — L8990 Pressure ulcer of unspecified site, unspecified stage: Secondary | ICD-10-CM | POA: Diagnosis not present

## 2020-05-05 DIAGNOSIS — R197 Diarrhea, unspecified: Secondary | ICD-10-CM | POA: Insufficient documentation

## 2020-05-05 DIAGNOSIS — C760 Malignant neoplasm of head, face and neck: Secondary | ICD-10-CM | POA: Insufficient documentation

## 2020-05-05 DIAGNOSIS — J449 Chronic obstructive pulmonary disease, unspecified: Secondary | ICD-10-CM | POA: Diagnosis not present

## 2020-05-05 DIAGNOSIS — C7839 Secondary malignant neoplasm of other respiratory organs: Secondary | ICD-10-CM | POA: Insufficient documentation

## 2020-05-05 DIAGNOSIS — Z452 Encounter for adjustment and management of vascular access device: Secondary | ICD-10-CM | POA: Diagnosis present

## 2020-05-05 DIAGNOSIS — C321 Malignant neoplasm of supraglottis: Secondary | ICD-10-CM | POA: Insufficient documentation

## 2020-05-05 MED ORDER — DIPHENOXYLATE-ATROPINE 2.5-0.025 MG PO TABS: 1.0000 | ORAL_TABLET | Freq: Four times a day (QID) | ORAL | 1 refills | Status: DC | PRN

## 2020-05-05 NOTE — Progress Notes (Signed)
Tracheostomy Procedure Note  Veronica Horton [id number] [birth year]  Pre Procedure Tracheostomy Information  Trach Brand: Shiley Size: 6.0 Style: Uncuffed Flexed [id number] Secured by: Velcro   Procedure: Trach cleaning and trach change    Post Procedure Tracheostomy Information  Trach Brand: Shiley Size: 6.0 Style:  Uncuffed Flexed [id number] Secured by: Velcro   Post Procedure Evaluation:  ETCO2 positive color change from yellow to purple : Yes.   Vital signs:VSS and pulse oximetry 95% on 8 lpm via trach collar Patients current condition: stable Complications: No apparent complications Trach site exam:  clean and dry Wound care done: dry and drain gauze applied Patient did tolerate procedure well.   Education: none  Prescription needs: none    Additional needs: New PMV given to patient

## 2020-05-05 NOTE — Telephone Encounter (Signed)
-----   Message from Erick Colace, NP sent at 05/05/2020  2:23 PM EST ----- Regarding: DME needs [employer]  Need to have [employer] get her some new suction catheters  She needs 14 french # 49 or what ever they will supply for 1 month.   She tells me they have been sending to the wrong address so make sure it goes to the below address.    [address]  [place] [address]   Thanks   Laurey Arrow

## 2020-05-05 NOTE — Progress Notes (Signed)
[facility]   Reason for visit:  Planned trach change  HPI:  Veronica Horton presented today to tracheostomy clinic for planned tracheostomy change.  I last saw her in the hospital back in the first part of December, at which point I changed her to a cuffless tracheostomy as she awaited starting her radiation therapy.  She presents today for routine tracheostomy change and follow-up.  At time of my last visit with Veronica Horton she still had a fairly significant tracheobronchitis for which was being treated and at that time had fairly thick tracheal secretions.  She was unable to tolerate Passy-Muir valve at that point.  She is done remarkably well in the outpatient setting, she actually started using the Passy-Muir valve about 2 weeks ago and she is really thrived with it.  She presents today reporting that she uses the Passy-Muir valve pretty much all day long, she does take it off when she is eating, she also takes it off at night.  She does still cough, she does occasionally require suctioning as frequently as twice a day, otherwise she has no complaints from a tracheostomy standpoint.  She does have other complaints these include frequent diarrhea, this is been an active problem since her discharge and has not responded to medication at home, also PEG site discomfort. ROS  Review of Systems - History obtained from the patient General ROS: negative for - chills, fatigue, fever or night sweats Psychological ROS: negative ENT ROS: negative for - headaches, nasal discharge, sinus pain, sore throat or trach site pain Endocrine ROS: negative Respiratory ROS: no cough, shortness of breath, or wheezing will have intermittent clear cough mucous  Cardiovascular ROS: no chest pain or dyspnea on exertion Gastrointestinal ROS: no abdominal pain, change in bowel habits, or black or bloody stools positive for - diarrhea Genito-Urinary ROS: no dysuria, trouble voiding, or  hematuria Musculoskeletal ROS: negative Neurological ROS: no TIA or stroke symptoms   Was having some discomfort at the PEG stoma site.   Vital signs:  Pulse ox mid 90s no accessory use currently 30% aerosol trach collar Exam:   General this is a very pleasant 60 year old female currently sitting upright in the wheelchair she is in no acute distress HEENT normocephalic atraumatic she has a size 6 tracheostomy in place, its cuffless.  The tracheostomy stoma is unremarkable Pulmonary: Clear to auscultation without accessory use Cardiac: Regular rate and rhythm Abdomen: Soft, she is tender at tracheostomy stoma site.  There is an area of ulceration located at approximately 6-7 o'clock in relation to the PEG stoma which appears to be pressure related from feeding tube flange there is no significant discharge, but there is significant ulceration.  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  I do not think she is a candidate for downsizing given ongoing radiation therapy and occasional need for suction however I do think she can continue to push PMV trial as much as possible.  She does have significant diarrhea,  Discussion  She has been treating this in the outpatient setting with Imodium without success.  She also has fairly significant ulceration as mentioned above at her PEG site.  In  regards to the ulceration I have placed a dressing to help place the pressure a little more equally around the PEG site but ultimately I think this needs to be evaluated Plan   From a tracheostomy  standpoint I will see her again in 10 weeks for routine tracheostomy change In regards to the diarrhea I have given her prescription for Lomotil, I instructed her to try this, if it continues over the next 10 days she will need to see her primary care provider, she has seen several antibiotics in the past, if she continues to have diarrhea in spite of treatment this needs to be further evaluated and is beyond the scope of what I am comfortable treating in the tracheostomy clinic In regards to the PEG tube ulceration I have reached out to interventional radiology, they placed the original PEG tube.  I think that the current tube can probably repositioned some in regards to the flange but I have not actually manipulated these in the past.  Visit time: 40  minutes.   Veronica Horton ACNP-BC [place]

## 2020-05-06 ENCOUNTER — Ambulatory Visit
Admission: RE | Admit: 2020-05-06 | Discharge: 2020-05-06 | Disposition: A | Discharge status: Home / Self Care | Payer: Medicaid Other | Source: Ambulatory Visit | Attending: Radiation Oncology | Admitting: Radiation Oncology

## 2020-05-06 ENCOUNTER — Other Ambulatory Visit (HOSPITAL_COMMUNITY): Payer: Self-pay | Admitting: Student

## 2020-05-06 DIAGNOSIS — R633 Feeding difficulties, unspecified: Secondary | ICD-10-CM

## 2020-05-06 DIAGNOSIS — C760 Malignant neoplasm of head, face and neck: Secondary | ICD-10-CM | POA: Diagnosis not present

## 2020-05-07 ENCOUNTER — Other Ambulatory Visit (HOSPITAL_COMMUNITY): Payer: Self-pay | Admitting: Hematology and Oncology

## 2020-05-07 ENCOUNTER — Ambulatory Visit (HOSPITAL_COMMUNITY)
Admission: RE | Admit: 2020-05-07 | Discharge: 2020-05-07 | Disposition: A | Discharge status: Home / Self Care | Payer: Medicaid Other | Source: Ambulatory Visit | Attending: Student | Admitting: Student

## 2020-05-07 ENCOUNTER — Ambulatory Visit
Admission: RE | Admit: 2020-05-07 | Discharge: 2020-05-07 | Disposition: A | Discharge status: Home / Self Care | Payer: Medicaid Other | Source: Ambulatory Visit | Attending: Radiation Oncology | Admitting: Radiation Oncology

## 2020-05-07 ENCOUNTER — Ambulatory Visit: Payer: Medicaid Other | Admitting: Medical

## 2020-05-07 ENCOUNTER — Inpatient Hospital Stay: Payer: Medicaid Other | Attending: Medical | Admitting: Medical

## 2020-05-07 ENCOUNTER — Inpatient Hospital Stay: Payer: Medicaid Other

## 2020-05-07 ENCOUNTER — Other Ambulatory Visit: Payer: Self-pay

## 2020-05-07 ENCOUNTER — Other Ambulatory Visit: Payer: Medicaid Other

## 2020-05-07 VITALS — BP 132/52 | HR 40 | Temp 97.7°F | Resp 18 | Wt 250.3 lb

## 2020-05-07 DIAGNOSIS — R633 Feeding difficulties, unspecified: Secondary | ICD-10-CM | POA: Diagnosis not present

## 2020-05-07 DIAGNOSIS — I700 Atherosclerosis of aorta: Secondary | ICD-10-CM | POA: Insufficient documentation

## 2020-05-07 DIAGNOSIS — I2721 Secondary pulmonary arterial hypertension: Secondary | ICD-10-CM | POA: Insufficient documentation

## 2020-05-07 DIAGNOSIS — Z23 Encounter for immunization: Secondary | ICD-10-CM | POA: Diagnosis not present

## 2020-05-07 DIAGNOSIS — F17210 Nicotine dependence, cigarettes, uncomplicated: Secondary | ICD-10-CM | POA: Insufficient documentation

## 2020-05-07 DIAGNOSIS — I82431 Acute embolism and thrombosis of right popliteal vein: Secondary | ICD-10-CM | POA: Insufficient documentation

## 2020-05-07 DIAGNOSIS — Z79899 Other long term (current) drug therapy: Secondary | ICD-10-CM | POA: Insufficient documentation

## 2020-05-07 DIAGNOSIS — Z8249 Family history of ischemic heart disease and other diseases of the circulatory system: Secondary | ICD-10-CM | POA: Diagnosis not present

## 2020-05-07 DIAGNOSIS — N289 Disorder of kidney and ureter, unspecified: Secondary | ICD-10-CM | POA: Diagnosis not present

## 2020-05-07 DIAGNOSIS — C321 Malignant neoplasm of supraglottis: Secondary | ICD-10-CM

## 2020-05-07 DIAGNOSIS — R600 Localized edema: Secondary | ICD-10-CM | POA: Insufficient documentation

## 2020-05-07 DIAGNOSIS — C760 Malignant neoplasm of head, face and neck: Secondary | ICD-10-CM | POA: Diagnosis not present

## 2020-05-07 HISTORY — PX: IR RADIOLOGIST EVAL & MGMT: IMG5224

## 2020-05-07 LAB — COMPREHENSIVE METABOLIC PANEL
ALT: 11 U/L (ref 0–44)
AST: 11 U/L — ABNORMAL LOW (ref 15–41)
Albumin: 3.1 g/dL — ABNORMAL LOW (ref 3.5–5.0)
Alkaline Phosphatase: 45 U/L (ref 38–126)
Anion gap: 11 (ref 5–15)
BUN: 23 mg/dL — ABNORMAL HIGH (ref 6–20)
CO2: 28 mmol/L (ref 22–32)
Calcium: 9.3 mg/dL (ref 8.9–10.3)
Chloride: 103 mmol/L (ref 98–111)
Creatinine, Ser: 1.73 mg/dL — ABNORMAL HIGH (ref 0.44–1.00)
GFR, Estimated: 33 mL/min — ABNORMAL LOW (ref >60)
Glucose, Bld: 87 mg/dL (ref 70–99)
Potassium: 4.3 mmol/L (ref 3.5–5.1)
Sodium: 142 mmol/L (ref 135–145)
Total Bilirubin: 0.6 mg/dL (ref 0.3–1.2)
Total Protein: 6.9 g/dL (ref 6.5–8.1)

## 2020-05-07 LAB — CBC WITH DIFFERENTIAL/PLATELET
Abs Immature Granulocytes: 0.01 10*3/uL (ref 0.00–0.07)
Basophils Absolute: 0 10*3/uL (ref 0.0–0.1)
Basophils Relative: 0 %
Eosinophils Absolute: 0 10*3/uL (ref 0.0–0.5)
Eosinophils Relative: 0 %
HCT: 30.1 % — ABNORMAL LOW (ref 36.0–46.0)
Hemoglobin: 9.7 g/dL — ABNORMAL LOW (ref 12.0–15.0)
Immature Granulocytes: 0 %
Lymphocytes Relative: 13 %
Lymphs Abs: 0.6 10*3/uL — ABNORMAL LOW (ref 0.7–4.0)
MCH: 31.9 pg (ref 26.0–34.0)
MCHC: 32.2 g/dL (ref 30.0–36.0)
MCV: 99 fL (ref 80.0–100.0)
Monocytes Absolute: 0.5 10*3/uL (ref 0.1–1.0)
Monocytes Relative: 12 %
Neutro Abs: 3 10*3/uL (ref 1.7–7.7)
Neutrophils Relative %: 75 %
Platelets: 186 10*3/uL (ref 150–400)
RBC: 3.04 MIL/uL — ABNORMAL LOW (ref 3.87–5.11)
RDW: 17.9 % — ABNORMAL HIGH (ref 11.5–15.5)
WBC: 4.1 10*3/uL (ref 4.0–10.5)
nRBC: 0 % (ref 0.0–0.2)

## 2020-05-07 MED ORDER — SODIUM CHLORIDE 0.9 % IV SOLN
INTRAVENOUS | Status: DC
  Filled 2020-05-07: qty 250

## 2020-05-07 MED FILL — Dexamethasone Sodium Phosphate Inj 100 MG/10ML: INTRAMUSCULAR | Qty: 1 | Status: AC

## 2020-05-07 MED FILL — Fosaprepitant Dimeglumine For IV Infusion 150 MG (Base Eq): INTRAVENOUS | Qty: 5 | Status: AC

## 2020-05-07 NOTE — Progress Notes (Signed)
Patient transported to radiology via wheelchair.  Family at patient's side.  Stable at discharge.

## 2020-05-07 NOTE — Progress Notes (Signed)
Update for treatment:  Hold cisplatin 05/08/20 due to elevated scr.  Give IV fluids NS only on 05/08/20.  Delay treatment of cisplatin until 05/14/20.  Dr Eliseo Squires, PA/Jamera Vanloan Ronnald Ramp, PharmD

## 2020-05-07 NOTE — Progress Notes (Signed)
Patient ID: Veronica Horton, female   DOB: 22-Jan-1961, 60 y.o.   MRN: [id number] Veronica Horton presented to IR department today for evaluation of some redness and skin irritation at gastrostomy tube insertion site.  Her 25 French balloon retention gastrostomy tube was placed on 03/22/2020.  She continues to use the tube and it is functioning appropriately.  Recent CT on 04/24/2020 showed appropriate positioning of the tube.  On exam today there is a small amount of irritation/skin breakdown near inferior border of skin with outer disc.  A single T TAC remains in place.  This was removed today.  A single layer of drain sponge gauze was placed under the disc to prevent further skin irritation.  Patient was encouraged to discontinue Neosporin at site and begin placing Desitin and/or Vaseline in region as a moisture barrier and to aid with skin healing.  She was told to contact our service if problems continue.

## 2020-05-07 NOTE — Progress Notes (Signed)
Patient ID: Veronica Horton, female   DOB: [birth year], 61 y.o.   MRN: [id number]

## 2020-05-07 NOTE — Patient Instructions (Signed)
Per Sandi Mealy, OK to treat despite serum creatinine of 1.73.  Gardiner Rhyme, RN

## 2020-05-08 ENCOUNTER — Inpatient Hospital Stay: Payer: Medicaid Other

## 2020-05-08 VITALS — BP 110/78 | HR 52 | Temp 98.7°F | Resp 18

## 2020-05-08 DIAGNOSIS — E860 Dehydration: Secondary | ICD-10-CM

## 2020-05-08 DIAGNOSIS — C321 Malignant neoplasm of supraglottis: Secondary | ICD-10-CM | POA: Diagnosis not present

## 2020-05-08 MED ORDER — SODIUM CHLORIDE 0.9 % IV SOLN
INTRAVENOUS | Status: DC
  Filled 2020-05-08 (×2): qty 250

## 2020-05-08 NOTE — Progress Notes (Signed)
Pt discharged to home w/ family mbr.  Pt A/O, VSS

## 2020-05-08 NOTE — Patient Instructions (Signed)

## 2020-05-09 NOTE — Progress Notes (Signed)
[facility] Progress Note   Veronica Horton [id number] 03/25/1961 61 y.o.  Veronica Horton is managed by Dr. 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 Har labs returned with her creatinine higher at 1.73. Her cisplatin will be held today. She continues with radiation and will return for follow up amd repeat labs on 05/10/2020. She will see Dr. Chryl Heck on 05/14/2020.  Renal insufficiency: Labs returned with a creatinine of 1.73. Chemotherapy will be held. She will return tomorrow for IV fluids and will return on 05/10/2020.  Please see After Visit Summary for patient specific instructions.  Future Appointments  Date Time Provider [place]  05/10/2020 11:45 AM [facility] [id number] [facility] None  05/10/2020 12:00 PM [place] [facility] None  05/10/2020 12:30 PM [facility] LAB [facility] None  05/10/2020  1:00 PM Novali Vollman E., PA-C [facility] None  05/11/2020 11:45 AM [facility] [id number] [facility] None  05/11/2020 12:00 PM Karie Mainland, RD [facility] None  05/12/2020 11:45 AM [facility] [id number] [facility] None  05/13/2020 11:45 AM [facility] [id number] [facility] None  05/14/2020  8:00 AM [facility] LAB [facility] None  05/14/2020  8:40 AM Iruku, Praveena, MD [facility] None  05/14/2020  9:30 AM [facility]  INFUSION [facility] None  05/14/2020 11:45 AM [facility] [id number] [facility] None  05/17/2020 11:45 AM [facility] [id number] [facility] None  05/18/2020 11:45 AM [facility] [id number] [facility] None  05/18/2020 12:00 PM Neff, Alejandro L, RD [facility] None  05/19/2020 11:45 AM [facility] [id number] [facility] None  05/20/2020 11:45 AM [facility] LINAC 3 [facility] None  05/21/2020  8:30 AM [facility] LAB [facility] None  05/21/2020  9:00 AM Iruku, Arletha Pili, MD [facility] None  05/21/2020 10:00 AM [facility] INFUSION [facility] None    Orders Placed This Encounter  Procedures  . CBC with Differential ([place] Only)  . CMP ([place] only)       Subjective:   Patient ID:  Veronica Horton is a 60 y.o. (DOB 1960/08/15) female.  Chief Complaint: No chief complaint on file.   HPI Veronica Horton  is a 60 y.o. female with a diagnosis of a squamous cell carcinoma of the supraglottis. She  Ms. continues with daily radiation and presents for cycle # 4 of cisplatin tomorrow. She was recently hospitalized from 04/24/2020 to 05/01/2020 with pneumonia, shortness of breath and sepsis. Hehe denies issues of concern today. Her labs returned with her creatinine higher at 1.73. She continues with radiation and will return for follow up amd repeat labs on 05/10/2020. She will see Dr. Chryl Heck on 05/14/2020.     Medications: I have reviewed the patient's current medications.  Allergies:  Allergies  Allergen Reactions  . Codeine Hives    Reports itching only per RN  . Penicillins Hives    Did it involve swelling of the face/tongue/throat, SOB, or low BP? No Did it involve sudden or severe rash/hives, skin peeling, or any reaction on the inside of your mouth or nose? Yes Did you  need to seek medical attention at a hospital or doctor's office? Unknown When did it last happen?Over 10 years If all above answers are "NO", may proceed with cephalosporin use.     Past Medical  History:  Diagnosis Date  . Bronchitis   . Class 3 obesity 01/23/2020  . COPD (chronic obstructive pulmonary disease) ([place])   . Degenerative disc disease, lumbar   . Hiatal hernia   . Hypertension     Past Surgical History:  Procedure Laterality Date  . BIOPSY  04/12/2020   Procedure: BIOPSY;  Surgeon: Wilford Corner, MD;  [facility];  Service: Gastroenterology;;  . CHOLECYSTECTOMY    . degenerative bone disease    . DIRECT LARYNGOSCOPY N/A 03/13/2020   Procedure: DIRECT LARYNGOSCOPY WITH BIOPSY;  Surgeon: Jason Coop, DO;  Location: [facility];  Service: ENT;  Laterality: N/A;  . FLEXIBLE SIGMOIDOSCOPY N/A 04/12/2020   Procedure: FLEXIBLE SIGMOIDOSCOPY;  Surgeon: Wilford Corner, MD;  [facility];  Service: Gastroenterology;  Laterality: N/A;  . INCISIONAL HERNIA REPAIR N/A 01/15/2019   Procedure: [name] HERNIORRHAPHY WITH MESH;  Surgeon: [name], MD;  [facility];  Service: General;  Laterality: N/A;  . IR GASTROSTOMY TUBE MOD SED  03/22/2020  . IR RADIOLOGIST EVAL & MGMT  05/07/2020  . OMENTECTOMY N/A 01/15/2019   Procedure: OMENTECTOMY;  Surgeon: [name], MD;  [facility];  Service: General;  Laterality: N/A;  . POLYPECTOMY  04/12/2020   Procedure: POLYPECTOMY;  Surgeon: Wilford Corner, MD;  [facility];  Service: Gastroenterology;;  . TRACHEOSTOMY TUBE PLACEMENT N/A 03/13/2020   Procedure: AWAKE TRACHEOSTOMY;  Surgeon: Jason Coop, DO;  Location: [facility];  Service: ENT;  Laterality: N/A;    Family History  Problem Relation Age of Onset  . Hypertension Mother   . Sudden Cardiac Death Neg Hx     Social History   Socioeconomic History  . Marital status: Divorced    Spouse name: Not on file  . Number of children: Not on file  . Years of education: Not on file  . Highest education level: Not on file  Occupational History  . Not on file  Tobacco Use  . Smoking status: Current Every Day Smoker     Packs/day: 1.00  . Smokeless tobacco: Never Used  Substance and Sexual Activity  . Alcohol use: No  . Drug use: No  . Sexual activity: Yes    Birth control/protection: Post-menopausal  Other Topics Concern  . Not on file  Social History Narrative  . Not on file   Social Determinants of Health   Financial Resource Strain: Not on file  Food Insecurity: Not on file  Transportation Needs: Not on file  Physical Activity: Not on file  Stress: Not on file  Social Connections: Not on file  Intimate Partner Violence: Not on file    Past Medical History, Surgical history, Social history, and Family history were reviewed and updated as appropriate.   Please see review of systems for further details on the patient's review from today.   Review of Systems:  Review of Systems  Constitutional: Negative for chills, diaphoresis and fever.  HENT: Negative for trouble swallowing and voice change.   Respiratory: Negative for cough, choking, chest tightness, shortness of breath, wheezing and stridor.   Cardiovascular: Negative for chest pain and palpitations.  Gastrointestinal: Negative for abdominal pain, constipation, diarrhea, nausea and vomiting.  Musculoskeletal: Negative for back pain and myalgias.  Neurological: Negative for dizziness, light-headedness and  headaches.    Objective:   Physical Exam:  BP (!) 132/52 (BP Location: Right Arm, Patient Position: Sitting)   Pulse (!) 40   Temp 97.7 F (36.5 C) (Oral)   Resp 18   Wt 250 lb 4.8 oz (113.5 kg)   SpO2 99%   BMI 41.65 kg/m  ECOG: 1  Physical Exam Constitutional:      General: She is not in acute distress.    Appearance: She is not diaphoretic.  HENT:     Head: Normocephalic and atraumatic.  Eyes:     General: No scleral icterus.       Right eye: No discharge.        Left eye: No discharge.  Cardiovascular:     Rate and Rhythm: Normal rate and regular rhythm.     Heart sounds: Normal heart sounds. No murmur  heard. No friction rub. No gallop.   Pulmonary:     Effort: Pulmonary effort is normal. No respiratory distress.     Breath sounds: Normal breath sounds. No wheezing or rales.  Abdominal:     General: Bowel sounds are normal.    Skin:    General: Skin is warm and dry.     Findings: No erythema or rash.  Neurological:     Mental Status: She is alert.     Coordination: Coordination normal.     Gait: Gait normal.     Lab Review:     Component Value Date/Time   NA 142 05/07/2020 1220   K 4.3 05/07/2020 1220   CL 103 05/07/2020 1220   CO2 28 05/07/2020 1220   GLUCOSE 87 05/07/2020 1220   BUN 23 (H) 05/07/2020 1220   CREATININE 1.73 (H) 05/07/2020 1220   CALCIUM 9.3 05/07/2020 1220   PROT 6.9 05/07/2020 1220   ALBUMIN 3.1 (L) 05/07/2020 1220   AST 11 (L) 05/07/2020 1220   ALT 11 05/07/2020 1220   ALKPHOS 45 05/07/2020 1220   BILITOT 0.6 05/07/2020 1220   GFRNONAA 33 (L) 05/07/2020 1220   GFRAA >60 01/24/2020 0425       Component Value Date/Time   WBC 4.1 05/07/2020 1220   RBC 3.04 (L) 05/07/2020 1220   HGB 9.7 (L) 05/07/2020 1220   HCT 30.1 (L) 05/07/2020 1220   PLT 186 05/07/2020 1220   MCV 99.0 05/07/2020 1220   MCH 31.9 05/07/2020 1220   MCHC 32.2 05/07/2020 1220   RDW 17.9 (H) 05/07/2020 1220   LYMPHSABS 0.6 (L) 05/07/2020 1220   MONOABS 0.5 05/07/2020 1220   EOSABS 0.0 05/07/2020 1220   BASOSABS 0.0 05/07/2020 1220   -------------------------------  Imaging from last 24 hours (if applicable):  Radiology interpretation: DG Chest 1 View  Result Date: 04/29/2020 CLINICAL DATA:  Possible aspiration pneumonia. EXAM: CHEST  1 VIEW COMPARISON:  April 24, 2020. FINDINGS: Stable cardiomegaly. Tracheostomy tube is unchanged in position. No pneumothorax is noted. Increased right middle lobe opacity is noted concerning for atelectasis or infiltrate. Stable left basilar atelectasis or infiltrate is noted. Bony thorax is unremarkable. IMPRESSION: Increased right  middle lobe opacity is noted concerning for atelectasis or infiltrate. Stable left basilar atelectasis or infiltrate is noted. Electronically Signed   By: Marijo Conception M.D.   On: 04/29/2020 13:23   CT HEAD WO CONTRAST  Result Date: 04/27/2020 CLINICAL DATA:  Cerebral edema on heparin drip. EXAM: CT HEAD WITHOUT CONTRAST TECHNIQUE: Contiguous axial images were obtained from the base of the skull through the vertex without intravenous contrast.  COMPARISON:  No head CT for comparison FINDINGS: Brain: No midline shift. Signs of vasogenic edema with mass along the high RIGHT parietal region which may be extra-axial measuring approximately 3.7 x 3.0 x 2.3 cm. Extensive vasogenic edema in the RIGHT occipital and parietal lobe with mild mass effect upon the posterior horn of the lateral ventricle but without signs of hemorrhage. Mass appears to have areas of calcification. No hydrocephalus or abnormal extra-axial fluid. Vascular: No hyperdense vessel or unexpected calcification. Skull: Normal. Negative for fracture or focal lesion. Sinuses/Orbits: Air-fluid level in the LEFT sphenoid sinus. Mild mucosal thickening of LEFT sphenoid. Orbits are unremarkable. Other: None. IMPRESSION: 1. Extensive vasogenic edema in the RIGHT parietal/occipital region associated with what is likely an extra-axial mass with calcification. Meningioma is favored. Metastatic disease while considered is felt less likely. MRI with and without contrast is suggested for further assessment. 2. Mild mass effect upon the RIGHT lateral ventricle, posterior horn, no signs of midline shift. 3. No signs of intracranial hemorrhage or abnormal extra-axial fluid. Electronically Signed   By: Zetta Bills M.D.   On: 04/27/2020 19:56   CT SOFT TISSUE NECK WO CONTRAST  Result Date: 04/27/2020 CLINICAL DATA:  Obstructive dysphagia, laryngeal squamous cell carcinoma post tracheostomy EXAM: CT NECK WITHOUT CONTRAST TECHNIQUE: Multidetector CT imaging of  the neck was performed following the standard protocol without intravenous contrast. COMPARISON:  03/11/2020 FINDINGS: Pharynx and larynx: Abnormal soft tissue thickening involving the supraglottic larynx and hypopharynx as seen previously with paraglottic extension. True cords may be spared but submucosal involvement is not excluded. Cartilage involvement as previously described. Measurement is difficult but there has been a decrease in size with improved visualization of the airway. Salivary glands: Unremarkable. Thyroid: Unremarkable. Lymph nodes: Suboptimal evaluation in the absence of intravenous contrast. Abnormal left level 3 node (axial series 2, image 78) is similar in size. Left supraclavicular node on image 89 has increased in size measuring 1.5 x 0.9 cm (previously 1.2 x 0.7 cm). Vascular: No significant abnormality on this noncontrast study. Limited intracranial: Hypoattenuation in the right posterior cerebral white matter. There is mild mass effect. Partially imaged focus of mineralization at the superior margin of this region. Visualized orbits: Unremarkable. Mastoids and visualized paranasal sinuses: Mild paranasal sinus mucosal thickening. Minimal right mastoid opacification. Skeleton: No acute osseous abnormality. Upper chest: Refer to dedicated chest imaging. Other: None. IMPRESSION: Similar extent but decreased bulkiness of supraglottic tumor with improved visualization of airway. Similar size of enlarged left level 3 node. Increase in size of left supraclavicular node. Hypoattenuation in the right posterior cerebral white matter. This was not imaged on the prior study and may reflect vasogenic edema. MRI with contrast recommended. These results will be called to the ordering clinician or representative by the Radiologist Assistant, and communication documented in the PACS or [employer]. Electronically Signed   By: Macy Mis M.D.   On: 04/27/2020 15:22   CT CHEST WO  CONTRAST  Result Date: 04/27/2020 CLINICAL DATA:  Pneumonia. EXAM: CT CHEST WITHOUT CONTRAST TECHNIQUE: Multidetector CT imaging of the chest was performed following the standard protocol without IV contrast. COMPARISON:  April 13, 2020. FINDINGS: Cardiovascular: No evidence of thoracic aortic aneurysm. Mild cardiomegaly is noted. No pericardial effusion. Mediastinum/Nodes: Tracheostomy tube is in good position. The esophagus is unremarkable. No significant adenopathy is noted. Thyroid gland is unremarkable. Lungs/Pleura: No pneumothorax is noted. Small bilateral pleural effusions are noted with adjacent subsegmental atelectasis or infiltrates. Increased patchy opacities are noted in both upper  lobes concerning for multifocal pneumonia. Upper Abdomen: No acute abnormality. Musculoskeletal: No chest wall mass or suspicious bone lesions identified. IMPRESSION: 1. Small bilateral pleural effusions are noted with adjacent subsegmental atelectasis or infiltrates. 2. Increased patchy opacities are noted in both upper lobes concerning for multifocal pneumonia. 3. Tracheostomy tube is in good position. Electronically Signed   By: Marijo Conception M.D.   On: 04/27/2020 15:10   CT CHEST WO CONTRAST  Result Date: 04/13/2020 CLINICAL DATA:  Pleural effusion EXAM: CT CHEST WITHOUT CONTRAST TECHNIQUE: Multidetector CT imaging of the chest was performed following the standard protocol without IV contrast. COMPARISON:  03/15/2020 FINDINGS: Cardiovascular: Extensive coronary artery calcification again noted predominantly within the left anterior descending coronary artery. Global cardiac size within normal limits. No pericardial effusion. Central pulmonary arteries are enlarged in keeping with changes of pulmonary arterial hypertension, similar to that noted on prior examination. Mild atherosclerotic calcification is seen within the thoracic aorta. Aberrant right subclavian artery noted. Mediastinum/Nodes: Tracheostomy in  expected position. No pathologic thoracic adenopathy. The esophagus is unremarkable. Lungs/Pleura: Patchy pulmonary infiltrate is seen within the a left upper lobe, new since prior examination and likely infectious in the acute setting. Bibasilar atelectasis is present with subtotal collapse of the lower lobes bilaterally. Small bilateral pleural effusions are present, left greater than right. No pneumothorax. No central obstructing lesion. Upper Abdomen: Cholecystectomy has been performed. No acute abnormality. Musculoskeletal: No lytic or blastic bone lesions are seen. Multiple remote left rib fractures are noted no acute bone abnormality. IMPRESSION: Small bilateral pleural effusions with associated bibasilar atelectasis. Patchy airspace infiltrate within the left upper lobe, likely infectious in the acute setting. No central obstructing lesion. Extensive coronary artery calcification. Morphologic changes in keeping with pulmonary arterial hypertension. Aortic Atherosclerosis (ICD10-I70.0). Electronically Signed   By: Fidela Salisbury MD   On: 04/13/2020 20:19   CT ABDOMEN PELVIS W CONTRAST  Result Date: 04/24/2020 CLINICAL DATA:  Abdominal pain, fever, for surge EXAM: CT ABDOMEN AND PELVIS WITH CONTRAST TECHNIQUE: Multidetector CT imaging of the abdomen and pelvis was performed using the standard protocol following bolus administration of intravenous contrast. CONTRAST:  46mL OMNIPAQUE IOHEXOL 300 MG/ML  SOLN COMPARISON:  04/09/2020 FINDINGS: Lower chest: Bandlike scarring and atelectasis of the dependent bilateral lung bases. Multiple nonacute fractures of the posterior left ribs. Hepatobiliary: No focal liver abnormality is seen. Status post cholecystectomy. No biliary dilatation. Pancreas: Unremarkable. No pancreatic ductal dilatation or surrounding inflammatory changes. Spleen: Normal in size without significant abnormality. Adrenals/Urinary Tract: Adrenal glands are unremarkable. Kidneys are normal,  without renal calculi, solid lesion, or hydronephrosis. Bladder is unremarkable. Stomach/Bowel: Percutaneous gastrostomy, balloon within the gastric lumen. Appendix appears normal. No evidence of bowel wall thickening, distention, or inflammatory changes. Vascular/Lymphatic: Aortic atherosclerosis. No enlarged abdominal or pelvic lymph nodes. Reproductive: No mass or other significant abnormality. Other: Broad-based midline ventral abdominal hernia, status post mesh repair. Redemonstrated recurrent inferior hernia component, containing a single, nonobstructed loop of mid small bowel (series 2, image 62). No abdominopelvic ascites. Musculoskeletal: No acute or significant osseous findings. IMPRESSION: 1. No acute CT findings of the abdomen or pelvis to explain abdominal pain. No evidence infectious nidus internal to the abdomen or pelvis. 2. Previously identified perirectal fat stranding is resolved, consistent with resolution of infectious or inflammatory proctitis. 3. Broad-based midline ventral abdominal hernia, status post mesh repair. Redemonstrated recurrent inferior hernia component, containing a single, nonobstructed loop of mid small bowel. Aortic Atherosclerosis (ICD10-I70.0). Electronically Signed   By: Eddie Candle  M.D.   On: 04/24/2020 17:05   DG Chest Port 1 View  Result Date: 04/24/2020 CLINICAL DATA:  Questionable sepsis EXAM: PORTABLE CHEST 1 VIEW COMPARISON:  04/11/2020, CT chest, 04/13/2020 FINDINGS: Cardiomegaly. Tracheostomy. Bandlike scarring or atelectasis of the right midlung. There is a background of diffuse, coarse appearing interstitial opacity. No acute appearing airspace opacity. IMPRESSION: 1.  Cardiomegaly. 2.  Tracheostomy. 3. Bandlike scarring or atelectasis of the right midlung. There is a background of diffuse, coarse appearing interstitial opacity, generally corresponding to scarring and atelectasis seen on prior CT. No acute appearing airspace opacity. Electronically Signed    By: Eddie Candle M.D.   On: 04/24/2020 15:23   DG CHEST PORT 1 VIEW  Result Date: 04/11/2020 CLINICAL DATA:  Hypoxia. EXAM: PORTABLE CHEST 1 VIEW COMPARISON:  April 08, 2020. FINDINGS: Stable cardiomediastinal silhouette. Tracheostomy tube is unchanged. No pneumothorax is noted. Stable left pleural effusion is noted with associated left basilar atelectasis or infiltrate. Right midlung subsegmental atelectasis is noted with probable small right pleural effusion. Bony thorax is unremarkable. IMPRESSION: Stable left pleural effusion with associated left basilar atelectasis or infiltrate. Right midlung subsegmental atelectasis is noted with probable small right pleural effusion. Electronically Signed   By: Marijo Conception M.D.   On: 04/11/2020 13:20   DG Abd Portable 1V  Result Date: 04/13/2020 CLINICAL DATA:  Abdominal pain EXAM: PORTABLE ABDOMEN - 1 VIEW COMPARISON:  CT abdomen and pelvis April 09, 2020; abdominal radiograph April 07, 2020 FINDINGS: Gastrostomy catheter overlying stomach region. Moderate stool in colon. No bowel dilatation or air-fluid level to suggest bowel obstruction. No free air. Postoperative change left pelvic region. Loculated effusion left base. IMPRESSION: No bowel obstruction or free air. Moderate stool in colon. Loculated pleural effusion lateral left base. Electronically Signed   By: Lowella Grip III M.D.   On: 04/13/2020 16:14   DG Swallowing Func-Speech Pathology  Result Date: 04/26/2020 Objective Swallowing Evaluation: Type of Study: MBS-Modified Barium Swallow Study  Patient Details Name: ELARA COCKE MRN: [id number] Date of Birth: 26-May-1960 Today's Date: 04/26/2020 Time: SLP Start Time (ACUTE ONLY): 3295 -SLP Stop Time (ACUTE ONLY): 1901 SLP Time Calculation (min) (ACUTE ONLY): 11 min Past Medical History: Past Medical History: Diagnosis Date . Bronchitis  . Class 3 obesity 01/23/2020 . COPD (chronic obstructive pulmonary disease) ([place])  . Degenerative disc  disease, lumbar  . Hiatal hernia  . Hypertension  Past Surgical History: Past Surgical History: Procedure Laterality Date . BIOPSY  04/12/2020  Procedure: BIOPSY;  Surgeon: Wilford Corner, MD;  [facility];  Service: Gastroenterology;; . CHOLECYSTECTOMY   . degenerative bone disease   . DIRECT LARYNGOSCOPY N/A 03/13/2020  Procedure: DIRECT LARYNGOSCOPY WITH BIOPSY;  Surgeon: Jason Coop, DO;  Location: [facility];  Service: ENT;  Laterality: N/A; . FLEXIBLE SIGMOIDOSCOPY N/A 04/12/2020  Procedure: FLEXIBLE SIGMOIDOSCOPY;  Surgeon: Wilford Corner, MD;  [facility];  Service: Gastroenterology;  Laterality: N/A; . INCISIONAL HERNIA REPAIR N/A 01/15/2019  Procedure: [name] HERNIORRHAPHY WITH MESH;  Surgeon: [name], MD;  [facility];  Service: General;  Laterality: N/A; . IR GASTROSTOMY TUBE MOD SED  03/22/2020 . OMENTECTOMY N/A 01/15/2019  Procedure: OMENTECTOMY;  Surgeon: [name], MD;  [facility];  Service: General;  Laterality: N/A; . POLYPECTOMY  04/12/2020  Procedure: POLYPECTOMY;  Surgeon: Wilford Corner, MD;  [facility];  Service: Gastroenterology;; . TRACHEOSTOMY TUBE PLACEMENT N/A 03/13/2020  Procedure: AWAKE TRACHEOSTOMY;  Surgeon: Ebbie Latus A, DO;  Location: [facility];  Service: ENT;  Laterality: N/A; HPI: Edward Trevino is a 60 y/o F with history of COPD,chronic respiratory failure on home oxygen at 5 L/min, laryngeal cancer s/p tracheostomy, PEG and is undergoing XRT/chemo.  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  Prior medical history includes smoking and some dyspnea if walking long distanced.  She has been on a dys3/thin diet and follow up indicated to provide information including exercises etc for potential side effects of XRT/chemo.  Pt has been maintained on a dys3/thin diet and tube feeding. Readmitted with pna - concern for aspiration.  Subjective: pt awake in chair Assessment / Plan / Recommendation CH[facility] IP CLINICAL  IMPRESSIONS 04/26/2020 Clinical Impression -- SLP Visit Diagnosis Dysphagia, pharyngoesophageal phase (R13.14) Attention and concentration deficit following -- Frontal lobe and executive function deficit following -- Impact on safety and function --   [facility] IP TREATMENT RECOMMENDATION 04/26/2020 Treatment Recommendations Therapy as outlined in treatment plan below   Prognosis 04/26/2020 Prognosis for Safe Diet Advancement Good Barriers to Reach Goals -- Barriers/Prognosis Comment -- [facility] IP DIET RECOMMENDATION 04/26/2020 SLP Diet Recommendations -- Liquid Administration via -- Medication Administration -- Compensations Slow rate;Small sips/bites Postural Changes --   CH[facility] IP OTHER RECOMMENDATIONS 04/26/2020 Recommended Consults -- Oral Care Recommendations Oral care BID Other Recommendations Clarify dietary restrictions;Have oral suction available   [facility] IP FOLLOW UP RECOMMENDATIONS 04/26/2020 Follow up Recommendations Skilled Nursing facility   [facility] IP FREQUENCY AND DURATION 04/26/2020 Speech Therapy Frequency (ACUTE ONLY) min 1 x/week Treatment Duration 1 week      CHL IP ORAL PHASE 04/26/2020 Oral Phase WFL Oral - Pudding Teaspoon -- Oral - Pudding Cup -- Oral - Honey Teaspoon -- Oral - Honey Cup -- Oral - Nectar Teaspoon -- Oral - Nectar Cup -- Oral - Nectar Straw -- Oral - Thin Teaspoon -- Oral - Thin Cup -- Oral - Thin Straw -- Oral - Puree -- Oral - Mech Soft -- Oral - Regular -- Oral - Multi-Consistency -- Oral - Pill -- Oral Phase - Comment --  CHL IP PHARYNGEAL PHASE 04/26/2020 Pharyngeal Phase Impaired Pharyngeal- Pudding Teaspoon -- Pharyngeal -- Pharyngeal- Pudding Cup -- Pharyngeal -- Pharyngeal- Honey Teaspoon -- Pharyngeal -- Pharyngeal- Honey Cup -- Pharyngeal -- Pharyngeal- Nectar Teaspoon Reduced airway/laryngeal closure;Pharyngeal residue - valleculae Pharyngeal Material does not enter airway Pharyngeal- Nectar Cup -- Pharyngeal -- Pharyngeal- Nectar Straw Reduced airway/laryngeal closure;Reduced  epiglottic inversion;Penetration/Aspiration during swallow;Penetration/Apiration after swallow;Trace aspiration Pharyngeal Material enters airway, passes BELOW cords then ejected out;Material enters airway, remains ABOVE vocal cords and not ejected out Pharyngeal- Thin Teaspoon Reduced epiglottic inversion;Reduced airway/laryngeal closure Pharyngeal -- Pharyngeal- Thin Cup Reduced epiglottic inversion;Reduced airway/laryngeal closure;Penetration/Aspiration during swallow;Penetration/Apiration after swallow;Trace aspiration Pharyngeal Material enters airway, remains ABOVE vocal cords then ejected out;Material enters airway, passes BELOW cords then ejected out Pharyngeal- Thin Straw Penetration/Aspiration during swallow;Penetration/Apiration after swallow;Trace aspiration;Reduced epiglottic inversion Pharyngeal Material enters airway, passes BELOW cords then ejected out Pharyngeal- Puree Reduced epiglottic inversion;Pharyngeal residue - valleculae Pharyngeal -- Pharyngeal- Mechanical Soft Reduced epiglottic inversion;Pharyngeal residue - valleculae Pharyngeal Material does not enter airway Pharyngeal- Regular NT Pharyngeal -- Pharyngeal- Multi-consistency -- Pharyngeal -- Pharyngeal- Pill NT Pharyngeal -- Pharyngeal Comment --  CHL IP CERVICAL ESOPHAGEAL PHASE 04/26/2020 Cervical Esophageal Phase -- Pudding Teaspoon -- Pudding Cup -- Honey Teaspoon -- Honey Cup -- Nectar Teaspoon -- Nectar Cup -- Nectar Straw -- Thin Teaspoon -- Thin Cup Prominent cricopharyngeal segment Thin Straw -- Puree -- Mechanical Soft -- Regular -- Multi-consistency -- Pill -- Cervical Esophageal Comment  no backflow observed during SLP MBS [name], MS [facility] SLP Acute Rehab Services Office [phone] Pager [phone] [name] 04/26/2020, 7:41 PM              VAS [place] LOWER EXTREMITY VENOUS (DVT)  Result Date: 04/26/2020  Lower Venous DVT Study Indications: Edema.  Risk Factors: Cancer. Limitations: Body habitus and poor  ultrasound/tissue interface. Comparison Study: No prior studies. Performing Technologist: Oliver Hum RVT  Examination Guidelines: A complete evaluation includes B-mode imaging, spectral Doppler, color Doppler, and power Doppler as needed of all accessible portions of each vessel. Bilateral testing is considered an integral part of a complete examination. Limited examinations for reoccurring indications may be performed as noted. The reflux portion of the exam is performed with the patient in reverse Trendelenburg.  +---------+---------------+---------+-----------+----------+-------------------+ RIGHT    CompressibilityPhasicitySpontaneityPropertiesThrombus Aging      +---------+---------------+---------+-----------+----------+-------------------+ CFV      Full           Yes      Yes                                      +---------+---------------+---------+-----------+----------+-------------------+ SFJ      Full                                                             +---------+---------------+---------+-----------+----------+-------------------+ FV Prox  Full                                                             +---------+---------------+---------+-----------+----------+-------------------+ FV Mid   Full                                                             +---------+---------------+---------+-----------+----------+-------------------+ FV Distal               Yes      Yes                                      +---------+---------------+---------+-----------+----------+-------------------+ PFV      Full                                                             +---------+---------------+---------+-----------+----------+-------------------+ POP      None           No       No                   Acute               +---------+---------------+---------+-----------+----------+-------------------+ PTV  Full                                                              +---------+---------------+---------+-----------+----------+-------------------+ PERO                                                  Not well visualized +---------+---------------+---------+-----------+----------+-------------------+   +---------+---------------+---------+-----------+----------+-------------------+ LEFT     CompressibilityPhasicitySpontaneityPropertiesThrombus Aging      +---------+---------------+---------+-----------+----------+-------------------+ CFV      Full           Yes      Yes                                      +---------+---------------+---------+-----------+----------+-------------------+ SFJ      Full                                                             +---------+---------------+---------+-----------+----------+-------------------+ FV Prox  Full                                                             +---------+---------------+---------+-----------+----------+-------------------+ FV Mid   Full                                                             +---------+---------------+---------+-----------+----------+-------------------+ FV Distal               Yes      Yes                                      +---------+---------------+---------+-----------+----------+-------------------+ PFV      Full                                                             +---------+---------------+---------+-----------+----------+-------------------+ POP      Full           Yes      Yes                                      +---------+---------------+---------+-----------+----------+-------------------+ PTV      Full                                                             +---------+---------------+---------+-----------+----------+-------------------+  PERO                                                  Not well visualized  +---------+---------------+---------+-----------+----------+-------------------+     Summary: RIGHT: - Findings consistent with acute deep vein thrombosis involving the right popliteal vein. - No cystic structure found in the popliteal fossa.  LEFT: - There is no evidence of deep vein thrombosis in the lower extremity. However, portions of this examination were limited- see technologist comments above.  - No cystic structure found in the popliteal fossa.  *See table(s) above for measurements and observations. Electronically signed by Ruta Hinds MD on 04/26/2020 at 5:22:14 PM.    Final    VAS [place] UPPER EXTREMITY VENOUS DUPLEX  Result Date: 04/16/2020 UPPER VENOUS STUDY  Indications: Swelling, and Pain Comparison Study: no prior Performing Technologist: Abram Sander RVS  Examination Guidelines: A complete evaluation includes B-mode imaging, spectral Doppler, color Doppler, and power Doppler as needed of all accessible portions of each vessel. Bilateral testing is considered an integral part of a complete examination. Limited examinations for reoccurring indications may be performed as noted.  Right Findings: +----------+------------+---------+-----------+----------+-----------------+ RIGHT     CompressiblePhasicitySpontaneousProperties     Summary      +----------+------------+---------+-----------+----------+-----------------+ IJV           Full       Yes       Yes                                +----------+------------+---------+-----------+----------+-----------------+ Subclavian    Full       Yes       Yes                                +----------+------------+---------+-----------+----------+-----------------+ Axillary      Full       Yes       Yes                                +----------+------------+---------+-----------+----------+-----------------+ Brachial      Full       Yes       Yes                                 +----------+------------+---------+-----------+----------+-----------------+ Radial        Full                                                    +----------+------------+---------+-----------+----------+-----------------+ Ulnar         Full                                                    +----------+------------+---------+-----------+----------+-----------------+ Cephalic      None  Age Indeterminate +----------+------------+---------+-----------+----------+-----------------+ Basilic       None                                  Age Indeterminate +----------+------------+---------+-----------+----------+-----------------+  Summary:  Right: No evidence of deep vein thrombosis in the upper extremity. Findings consistent with age indeterminate superficial vein thrombosis involving the right cephalic vein and right basilic vein.  *See table(s) above for measurements and observations.  Diagnosing physician: Monica Martinez MD Electronically signed by Monica Martinez MD on 04/16/2020 at 4:45:55 PM.    Final    IR Radiologist Eval & Mgmt  Result Date: 05/07/2020 EXAM: ESTABLISHED PATIENT OFFICE VISIT CHIEF COMPLAINT: Erythema, discomfort at G-tube insertion site HISTORY OF PRESENT ILLNESS: Ms. Fentress is a 59 year old female with history of head /neck cancer and previous 86 French balloon retention gastrostomy tube placement in November of 2021. She presents today for some mild erythema and skin irritation around outer disc of G-tube. She continues to use the tube and it is functioning appropriately. Recent CT on 04/24/2020 showed appropriate positioning of the tube. REVIEW OF SYSTEMS: She does have some tenderness at gastrostomy tube outer disc site; denies fever, headache, worsening respiratory issues (has trach in place), nausea, vomiting or bleeding. PHYSICAL EXAMINATION: On exam today there is a small amount of irritation/skin breakdown near the  inferior border of the skin with the outer disc. A single T tack remains in place. This was removed today. A single-layer of drain sponge gauze was placed under the disc to prevent further skin irritation. Patient was encouraged to discontinue Neosporin at site and begin placing Desitin and/or Vaseline in region as a moisture barrier and to aid with skin healing. ASSESSMENT AND PLAN: Status post balloon retention gastrostomy tube in November 2021. Tube functioning appropriately. Now with small amount of irritation/skin breakdown near the inferior border of the skin with the outer disc. Remaining single T tack suture at site removed today. A single-layer of drain sponge gauze was placed under the disc to prevent further skin irritation. She was told to maintain a single layer of drain sponge gauze under the disc and change daily. She was encouraged to discontinue Neosporin at site and begin placing Desitin and/or Vaseline in region as a moisture barrier and to aid with skin healing. She was told to contact our service if problems continue. Read by: Rowe Robert, PA-C Electronically Signed   By: Markus Daft M.D.   On: 05/07/2020 14:13        Dr. Fanny Bien. This case was discussed with Dr. Chryl Heck. She expressed agreement with my management of this patient.

## 2020-05-10 ENCOUNTER — Telehealth: Payer: Self-pay

## 2020-05-10 ENCOUNTER — Inpatient Hospital Stay: Payer: Medicaid Other

## 2020-05-10 ENCOUNTER — Inpatient Hospital Stay (HOSPITAL_BASED_OUTPATIENT_CLINIC_OR_DEPARTMENT_OTHER): Payer: Medicaid Other | Admitting: Nurse Practitioner

## 2020-05-10 ENCOUNTER — Ambulatory Visit
Admission: RE | Admit: 2020-05-10 | Discharge: 2020-05-10 | Disposition: A | Discharge status: Home / Self Care | Payer: Medicaid Other | Source: Ambulatory Visit | Attending: Radiation Oncology | Admitting: Radiation Oncology

## 2020-05-10 ENCOUNTER — Other Ambulatory Visit: Payer: Medicaid Other

## 2020-05-10 ENCOUNTER — Ambulatory Visit: Payer: Medicaid Other | Admitting: Medical

## 2020-05-10 ENCOUNTER — Encounter: Payer: Self-pay | Admitting: Nurse Practitioner

## 2020-05-10 ENCOUNTER — Other Ambulatory Visit: Payer: Self-pay

## 2020-05-10 ENCOUNTER — Telehealth: Payer: Self-pay | Admitting: Hematology and Oncology

## 2020-05-10 VITALS — BP 146/58 | HR 50 | Resp 15 | Ht 65.0 in | Wt 254.5 lb

## 2020-05-10 DIAGNOSIS — N289 Disorder of kidney and ureter, unspecified: Secondary | ICD-10-CM

## 2020-05-10 DIAGNOSIS — C321 Malignant neoplasm of supraglottis: Secondary | ICD-10-CM

## 2020-05-10 DIAGNOSIS — Z23 Encounter for immunization: Secondary | ICD-10-CM

## 2020-05-10 DIAGNOSIS — C760 Malignant neoplasm of head, face and neck: Secondary | ICD-10-CM | POA: Diagnosis not present

## 2020-05-10 LAB — CBC WITH DIFFERENTIAL (CANCER CENTER ONLY)
Abs Immature Granulocytes: 0.02 10*3/uL (ref 0.00–0.07)
Basophils Absolute: 0 10*3/uL (ref 0.0–0.1)
Basophils Relative: 0 %
Eosinophils Absolute: 0 10*3/uL (ref 0.0–0.5)
Eosinophils Relative: 0 %
HCT: 29.5 % — ABNORMAL LOW (ref 36.0–46.0)
Hemoglobin: 9.8 g/dL — ABNORMAL LOW (ref 12.0–15.0)
Immature Granulocytes: 0 %
Lymphocytes Relative: 14 %
Lymphs Abs: 0.6 10*3/uL — ABNORMAL LOW (ref 0.7–4.0)
MCH: 31.8 pg (ref 26.0–34.0)
MCHC: 33.2 g/dL (ref 30.0–36.0)
MCV: 95.8 fL (ref 80.0–100.0)
Monocytes Absolute: 0.5 10*3/uL (ref 0.1–1.0)
Monocytes Relative: 11 %
Neutro Abs: 3.5 10*3/uL (ref 1.7–7.7)
Neutrophils Relative %: 75 %
Platelet Count: 179 10*3/uL (ref 150–400)
RBC: 3.08 MIL/uL — ABNORMAL LOW (ref 3.87–5.11)
RDW: 17.8 % — ABNORMAL HIGH (ref 11.5–15.5)
WBC Count: 4.7 10*3/uL (ref 4.0–10.5)
nRBC: 0 % (ref 0.0–0.2)

## 2020-05-10 LAB — CMP (CANCER CENTER ONLY)
ALT: 9 U/L (ref 0–44)
AST: 9 U/L — ABNORMAL LOW (ref 15–41)
Albumin: 3.2 g/dL — ABNORMAL LOW (ref 3.5–5.0)
Alkaline Phosphatase: 48 U/L (ref 38–126)
Anion gap: 9 (ref 5–15)
BUN: 22 mg/dL — ABNORMAL HIGH (ref 6–20)
CO2: 27 mmol/L (ref 22–32)
Calcium: 8.9 mg/dL (ref 8.9–10.3)
Chloride: 102 mmol/L (ref 98–111)
Creatinine: 1.39 mg/dL — ABNORMAL HIGH (ref 0.44–1.00)
GFR, Estimated: 43 mL/min — ABNORMAL LOW (ref >60)
Glucose, Bld: 97 mg/dL (ref 70–99)
Potassium: 3.8 mmol/L (ref 3.5–5.1)
Sodium: 138 mmol/L (ref 135–145)
Total Bilirubin: 0.5 mg/dL (ref 0.3–1.2)
Total Protein: 6.9 g/dL (ref 6.5–8.1)

## 2020-05-10 NOTE — Telephone Encounter (Signed)
Per Dr. Burr Medico a high priority scheduling message was sent to scheduling for the patient to receive Cisplatin tomorrow 05/11/2020.

## 2020-05-10 NOTE — Telephone Encounter (Signed)
Scheduled appointments per 1/7 los. Spoke to patient who is aware of appointments dates and times.  

## 2020-05-10 NOTE — Progress Notes (Signed)
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[place])   03/22/2020 Cancer Staging   Staging form: Larynx - Supraglottis, AJCC 7th Edition - Clinical: Stage IVA (T4a, N2c, M0) - Signed by Veronica Gibson, MD on 03/22/2020   03/23/2020 - 03/23/2020 Chemotherapy   The patient had dexamethasone (DECADRON) 4 MG tablet, 8 mg, Oral, Daily, 0 of 1 cycle, Start date: --, End date: -- palonosetron (ALOXI) injection 0.25 mg, 0.25 mg, Intravenous,  Once, 0 of 10 cycles CISplatin (PLATINOL) 182 mg in sodium chloride 0.9 % 500 mL chemo infusion, 80 mg/m2, Intravenous,  Once, 0 of 10 cycles gemcitabine (GEMZAR) 2,280 mg in sodium chloride 0.9 % 250 mL chemo infusion, 1,000 mg/m2, Intravenous,  Once, 0 of 3 cycles fosaprepitant (EMEND) 150 mg in sodium chloride 0.9 % 145 mL IVPB, 150 mg, Intravenous,  Once, 0 of 10 cycles  for chemotherapy treatment.    03/29/2020 -  Chemotherapy    Patient is on Treatment Plan: HEAD AND NECK CISPLATIN Q7D + XRT        CURRENT THERAPY: chemoRT with Cisplatin   INTERVAL HISTORY: Veronica Horton returns for follow up as scheduled. She continues RT. Cisplatin was held last week due to renal dysfunction Scr 1.73. She received IVF.  She thinks she has been able to stay hydrated over the last week, however since hospital discharge her legs are very swollen, tight and weeping fluid from the left leg.  Denies fever or chills.  From head/neck cancer standpoint her swallowing has improved on chemoradiation.  She can eat now, tolerating soft diet.  Still uses tube at night. Takes oxycodone PRN for pain near feeding  tube/right quadrant. Has periodic diarrhea up to 3 soft/liquid stools per day she attributes to Osmolite.  She is using Lomotil.  Denies nausea/vomiting.  She is having insurance issues with home care service and oxygen supply. She does her own trach care. O2 at 8 L which is her baseline.  She was told after her next 4 canisters she will not receive any more until she can pay a bill.  Medicaid is pending. All other systems were reviewed with the patient and are negative.   MEDICAL HISTORY:  Past Medical History:  Diagnosis Date  . Bronchitis   . Class 3 obesity 01/23/2020  . COPD (chronic obstructive pulmonary disease) ([place])   . Degenerative disc disease, lumbar   . Hiatal hernia   . Hypertension     SURGICAL HISTORY: Past Surgical History:  Procedure Laterality Date  . BIOPSY  04/12/2020   Procedure: BIOPSY;  Surgeon: Veronica Corner, MD;  Location: [facility];  Service: Gastroenterology;;  . CHOLECYSTECTOMY    . degenerative bone disease    . DIRECT LARYNGOSCOPY N/A 03/13/2020   Procedure: DIRECT LARYNGOSCOPY WITH BIOPSY;  Surgeon: Veronica Coop, DO;  Location: [facility];  Service: ENT;  Laterality: N/A;  . FLEXIBLE SIGMOIDOSCOPY N/A 04/12/2020   Procedure: FLEXIBLE SIGMOIDOSCOPY;  Surgeon: Veronica Corner, MD;  Location: [facility];  Service: Gastroenterology;  Laterality: N/A;  . INCISIONAL HERNIA REPAIR N/A 01/15/2019   Procedure: Veronica Horton HERNIORRHAPHY WITH MESH;  Surgeon: Veronica Signs, MD;  Location: [facility];  Service: General;  Laterality: N/A;  . IR GASTROSTOMY TUBE MOD SED  03/22/2020  . IR RADIOLOGIST EVAL & MGMT  05/07/2020  . OMENTECTOMY N/A 01/15/2019   Procedure: OMENTECTOMY;  Surgeon: Veronica Signs, MD;  Location: [facility];  Service: General;  Laterality: N/A;  . POLYPECTOMY  04/12/2020   Procedure: POLYPECTOMY;  Surgeon: Veronica Corner, MD;  Location: [facility];  Service: Gastroenterology;;  . TRACHEOSTOMY TUBE PLACEMENT N/A 03/13/2020   Procedure: AWAKE  TRACHEOSTOMY;  Surgeon: Veronica Coop, DO;  Location: [facility];  Service: ENT;  Laterality: N/A;    I have reviewed the social history and family history with the patient and they are unchanged from previous note.  ALLERGIES:  is allergic to codeine and penicillins.  MEDICATIONS:  Current Outpatient Medications  Medication Sig Dispense Refill  . diphenoxylate-atropine (LOMOTIL) 2.5-0.025 MG tablet Take 1 tablet by mouth 4 (four) times daily as needed for diarrhea or loose stools. 30 tablet 1  . arformoterol (BROVANA) 15 MCG/2ML NEBU Take 2 mLs (15 mcg total) by nebulization 2 (two) times daily. 120 mL 2  . bisacodyl (DULCOLAX) 10 MG suppository Place 1 suppository (10 mg total) rectally daily as needed for severe constipation. 12 suppository 0  . budesonide (PULMICORT) 0.5 MG/2ML nebulizer solution Take 2 mLs (0.5 mg total) by nebulization 2 (two) times daily. 120 mL 2  . dexamethasone (DECADRON) 2 MG tablet Take 2 tablets (4 mg total) by mouth 2 (two) times daily. 30 tablet 0  . diclofenac Sodium (VOLTAREN) 1 % GEL Apply 2 g topically 4 (four) times daily as needed (neck/back pain). 350 g 0  . diphenhydrAMINE (BENADRYL) 12.5 MG/5ML elixir Place 5 mLs (12.5 mg total) into feeding tube every 6 (six) hours as needed for itching. 120 mL 0  . escitalopram (LEXAPRO) 5 MG tablet Take 1 tablet (5 mg total) by mouth daily. 30 tablet 2  . feeding supplement, ENSURE ENLIVE, (ENSURE ENLIVE) LIQD Take 237 mLs by mouth 2 (two) times daily between meals. 786 mL 12  . folic acid (FOLVITE) 1 MG tablet Take 1 tablet (1 mg total) by mouth daily. 30 tablet 2  . furosemide (LASIX) 40 MG tablet Take 40 mg by mouth daily.    Marland Kitchen gabapentin (NEURONTIN) 300 MG capsule Take 2 capsules (600 mg total) by mouth at bedtime.    Marland Kitchen guaiFENesin-dextromethorphan (ROBITUSSIN DM) 100-10 MG/5ML syrup Take 10 mLs by mouth every 4 (four) hours as needed for cough. 118 mL 0  . hydrOXYzine (ATARAX/VISTARIL) 10 MG tablet Place 1  tablet (10 mg total) into feeding tube 3 (three) times daily as needed for anxiety. 30 tablet 2  . ipratropium-albuterol (DUONEB) 0.5-2.5 (3) MG/3ML SOLN Take 3 mLs by nebulization every 6 (six) hours as needed. 360 mL 2  . ketotifen (ZADITOR) 0.025 % ophthalmic solution Place 1 drop into both eyes 2 (two) times daily. 5 mL 0  . lactose free nutrition (BOOST PLUS) LIQD Take 237 mLs by mouth daily. 7110 mL 2  . levofloxacin (LEVAQUIN) 500 MG tablet Take 1 tablet (500 mg total) by mouth daily for 10 days. 5 tablet 0  . lidocaine (LIDODERM) 5 % Place 1 patch onto the skin daily. Remove & Discard patch within 12 hours or as directed by MD 30 patch 0  . melatonin 5 MG TABS Take 1 tablet (5 mg total) by mouth at bedtime. 30 tablet 2  . midodrine (PROAMATINE) 5 MG tablet Take 1 tablet (5 mg total) by mouth 2 (two) times  daily with a meal. 60 tablet 2  . Nutritional Supplements (FEEDING SUPPLEMENT, OSMOLITE 1.5 CAL,) LIQD Place 237 mLs into feeding tube 3 (three) times daily between meals. 21330 mL 2  . ondansetron (ZOFRAN) 8 MG tablet Take 1 tablet (8 mg total) by mouth 2 (two) times daily as needed. Start on the third day after chemotherapy. 30 tablet 1  . oxyCODONE (OXY IR/ROXICODONE) 5 MG immediate release tablet Place 1 tablet (5 mg total) into feeding tube every 6 (six) hours as needed for severe pain. 15 tablet 0  . pantoprazole sodium (PROTONIX) 40 mg/20 mL PACK Place 20 mLs (40 mg total) into feeding tube daily. 600 mL 2  . polyethylene glycol (MIRALAX / GLYCOLAX) 17 g packet Place 17 g into feeding tube 2 (two) times daily as needed for moderate constipation. 14 each 0  . prochlorperazine (COMPAZINE) 10 MG tablet Take 1 tablet (10 mg total) by mouth every 6 (six) hours as needed (Nausea or vomiting). 30 tablet 1  . saccharomyces boulardii (FLORASTOR) 250 MG capsule Take 1 capsule (250 mg total) by mouth 2 (two) times daily. 60 capsule 0  . scopolamine (TRANSDERM-SCOP) 1 MG/3DAYS Place 1 patch (1.5  mg total) onto the skin every 3 (three) days. 10 patch 12  . vitamin B-12 (CYANOCOBALAMIN) 500 MCG tablet Take 1 tablet (500 mcg total) by mouth daily. 30 tablet 3   No current facility-administered medications for this visit.    PHYSICAL EXAMINATION: ECOG PERFORMANCE STATUS: 2 - Symptomatic, <50% confined to bed  Vitals:   05/10/20 1317  BP: (!) 146/58  Pulse: (!) 50  Resp: 15  SpO2: 99%   Filed Weights   05/10/20 1545  Weight: 254 lb 8 oz (115.4 kg)    GENERAL:alert, no distress and comfortable SKIN: no rash  EYES: sclera clear LUNGS: clear with normal breathing effort. Trach in place. Supplemental oxygen not in use  HEART: regular rate & rhythm. Marked pitting edema with weeping to bilateral lower extremities. No significant erythema or warmth  ABDOMEN:abdomen soft, non-tender and normal bowel sounds. Feeding tube in place/capped  NEURO: alert & oriented x 3 with fluent speech   LABORATORY DATA:  I have reviewed the data as listed CBC Latest Ref Rng & Units 05/10/2020 05/07/2020 04/30/2020  WBC 4.0 - 10.5 K/uL 4.7 4.1 6.8  Hemoglobin 12.0 - 15.0 g/dL 9.8(L) 9.7(L) 9.9(L)  Hematocrit 36.0 - 46.0 % 29.5(L) 30.1(L) 31.4(L)  Platelets 150 - 400 K/uL 179 186 121(L)     CMP Latest Ref Rng & Units 05/10/2020 05/07/2020 04/30/2020  Glucose 70 - 99 mg/dL 97 87 120(H)  BUN 6 - 20 mg/dL 22(H) 23(H) 32(H)  Creatinine 0.44 - 1.00 mg/dL 1.39(H) 1.73(H) 1.15(H)  Sodium 135 - 145 mmol/L 138 142 138  Potassium 3.5 - 5.1 mmol/L 3.8 4.3 5.8(H)  Chloride 98 - 111 mmol/L 102 103 103  CO2 22 - 32 mmol/L 27 28 26   Calcium 8.9 - 10.3 mg/dL 8.9 9.3 9.0  Total Protein 6.5 - 8.1 g/dL 6.9 6.9 -  Total Bilirubin 0.3 - 1.2 mg/dL 0.5 0.6 -  Alkaline Phos 38 - 126 U/L 48 45 -  AST 15 - 41 U/L 9(L) 11(L) -  ALT 0 - 44 U/L 9 11 -      RADIOGRAPHIC STUDIES: I have personally reviewed the radiological images as listed and agreed with the findings in the report. No results found.   ASSESSMENT &  PLAN: 60 year old female  1.  Squamous cell carcinoma of  the supraglottis, [id number], stage IVA -Began concurrent chemoradiation with cisplatin on 03/29/2020, tolerating chemo well  -she has had chemo interruption for pneumonia and renal dysfunction -Clinically she is improving on treatment -Renal function has improved, plan to proceed with cisplatin this week. Plan was reviewed with Dr. Burr Medico  2.  Respiratory -Trach in place with supplemental oxygen 8 L -Recently treated for pneumonia, resolved -Today's respiratory exam is benign, she was off oxygen to ambulate to the bathroom, no respiratory distress on room air.  I recommend to try to wean O2 to 4-6 L  3.  Leg edema -Likely worsened by fluid overload during recent hospitalization, on Lasix 40 mg once daily -Albumin 3.2 -exam not c/w DVT, will hold doppler  -Recommend compression stockings if she can find a size that fits, otherwise will refer to home care for wrapping  4.  Renal dysfunction -Scr 1.73 on 05/07/2020, chemotherapy was held and patient was given hydration -Improved, Scr 1.39 today  5. Social support -medicaid application is pending -home health through [facility], patient's sister notes a payment is being requested but their funds are low. Have been told she may not get more O2 tanks if a bill is not paid.  -will ask SW to f/up    Plan: -Labs reviewed -Recommend to start compression stockings for leg edema, if not able to find a good fit will refer to wound care/home health for wrapping -Follow-up with social work/financial for Medicaid application -Wean O2 as tolerated, 4-6 L  -Received 2nd Covid19 vaccine today  -lab, f/up Dr. Chryl Heck, cisplatin 1/14 (sooner if possible pending infusion schedule) and 1/21  All questions were answered. The patient knows to call the clinic with any problems, questions or concerns. No barriers to learning were detected. Total encounter time was 30 minutes.      Veronica Feeling,  NP 05/10/20

## 2020-05-11 ENCOUNTER — Other Ambulatory Visit: Payer: Medicaid Other

## 2020-05-11 ENCOUNTER — Ambulatory Visit
Admission: RE | Admit: 2020-05-11 | Discharge: 2020-05-11 | Disposition: A | Discharge status: Home / Self Care | Payer: Medicaid Other | Source: Ambulatory Visit | Attending: Radiation Oncology | Admitting: Radiation Oncology

## 2020-05-11 ENCOUNTER — Ambulatory Visit: Payer: Medicaid Other | Admitting: Hematology and Oncology

## 2020-05-11 ENCOUNTER — Inpatient Hospital Stay: Payer: Medicaid Other | Admitting: Nutrition

## 2020-05-11 ENCOUNTER — Encounter: Payer: Self-pay | Admitting: General Practice

## 2020-05-11 ENCOUNTER — Ambulatory Visit: Payer: Medicaid Other

## 2020-05-11 DIAGNOSIS — C760 Malignant neoplasm of head, face and neck: Secondary | ICD-10-CM | POA: Diagnosis not present

## 2020-05-11 NOTE — Progress Notes (Signed)
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 She arrives in a wheelchair today. Reports she is having some taste alterations but she is swallowing okay. Noted some diarrhea improved now on Lomotil. She is using 1 carton of Osmolite 1.5 via PEG once a day. She is requesting samples of Ensure complete to supplement oral intake. She is eating a variety of soft foods and modifies textures and consistency as needed. Weight was documented as 257 pounds on January 10.  This reflects a slight increase but overall is stable.  Weight documented as 255 pounds in December 2021.  Nutrition diagnosis: Inadequate oral intake is improving.  Intervention: Continue 1 carton Osmolite 1.5 once daily with 60 mL of free water before and after bolus feeding. Continue soft, finely chopped diet to provide the majority of calories and protein required. Add Ensure complete twice daily.  Provided 1 sample case.  Monitoring, evaluation, goals: Will monitor patient's weights and oral intake.  Next visit: Tuesday, January 18 after radiation therapy.  **Disclaimer: This note was dictated with voice recognition software. Similar sounding words can inadvertently be transcribed and this note may contain transcription errors which may not have been corrected upon publication of note.**

## 2020-05-11 NOTE — Progress Notes (Signed)
[facility] CSW Progress Notes  CSW team was asked to investigate financial and insurance issues for patient.  [place] submitted patient's application for disability on Dec 11/2019.  [employer] submitted patient's application for Medicaid to [place] on 04/20/20.  Stefanie Libel, supervisor, confirmed today that DSS has the MEdicaid application and it is under review.  M  [employer] manager/land agent met w patient re Advertising account executive and also provided application for [employer], a [employer]ar[employer] manager that provides [employer] to patients diagnosed w cancer residing in [place].  Edwyna Shell, LCSW Clinical Social Worker Phone:  [phone]

## 2020-05-12 ENCOUNTER — Encounter (HOSPITAL_COMMUNITY): Payer: Self-pay

## 2020-05-12 ENCOUNTER — Other Ambulatory Visit: Payer: Self-pay

## 2020-05-12 ENCOUNTER — Ambulatory Visit
Admission: RE | Admit: 2020-05-12 | Discharge: 2020-05-12 | Disposition: A | Discharge status: Home / Self Care | Payer: Medicaid Other | Source: Ambulatory Visit | Attending: Radiation Oncology | Admitting: Radiation Oncology

## 2020-05-12 ENCOUNTER — Inpatient Hospital Stay (HOSPITAL_COMMUNITY): Payer: Medicaid Other | Attending: Hematology

## 2020-05-12 ENCOUNTER — Encounter: Payer: Self-pay | Admitting: General Practice

## 2020-05-12 VITALS — BP 120/57 | HR 55 | Temp 98.2°F | Resp 18 | Wt 260.3 lb

## 2020-05-12 DIAGNOSIS — R634 Abnormal weight loss: Secondary | ICD-10-CM | POA: Diagnosis not present

## 2020-05-12 DIAGNOSIS — C760 Malignant neoplasm of head, face and neck: Secondary | ICD-10-CM | POA: Diagnosis not present

## 2020-05-12 DIAGNOSIS — Z885 Allergy status to narcotic agent status: Secondary | ICD-10-CM | POA: Diagnosis not present

## 2020-05-12 DIAGNOSIS — Z880 Allergy status to penicillin: Secondary | ICD-10-CM | POA: Diagnosis not present

## 2020-05-12 DIAGNOSIS — Z79899 Other long term (current) drug therapy: Secondary | ICD-10-CM | POA: Diagnosis not present

## 2020-05-12 DIAGNOSIS — Z5111 Encounter for antineoplastic chemotherapy: Secondary | ICD-10-CM | POA: Diagnosis not present

## 2020-05-12 DIAGNOSIS — C321 Malignant neoplasm of supraglottis: Secondary | ICD-10-CM | POA: Diagnosis present

## 2020-05-12 DIAGNOSIS — Z7901 Long term (current) use of anticoagulants: Secondary | ICD-10-CM | POA: Diagnosis not present

## 2020-05-12 DIAGNOSIS — Z9049 Acquired absence of other specified parts of digestive tract: Secondary | ICD-10-CM | POA: Diagnosis not present

## 2020-05-12 DIAGNOSIS — R6883 Chills (without fever): Secondary | ICD-10-CM | POA: Diagnosis not present

## 2020-05-12 DIAGNOSIS — R600 Localized edema: Secondary | ICD-10-CM | POA: Diagnosis not present

## 2020-05-12 DIAGNOSIS — Z923 Personal history of irradiation: Secondary | ICD-10-CM | POA: Diagnosis not present

## 2020-05-12 DIAGNOSIS — I82431 Acute embolism and thrombosis of right popliteal vein: Secondary | ICD-10-CM | POA: Diagnosis not present

## 2020-05-12 DIAGNOSIS — Z931 Gastrostomy status: Secondary | ICD-10-CM | POA: Diagnosis not present

## 2020-05-12 MED ORDER — SODIUM CHLORIDE 0.9 % IV SOLN: Freq: Once | INTRAVENOUS | Status: AC

## 2020-05-12 MED ORDER — MAGNESIUM SULFATE 2 GM/50ML IV SOLN
2.0000 g | Freq: Once | INTRAVENOUS | Status: AC
  Administered 2020-05-12: 2 g via INTRAVENOUS
  Filled 2020-05-12: qty 50

## 2020-05-12 MED ORDER — SODIUM CHLORIDE 0.9 % IV SOLN: Freq: Once | INTRAVENOUS | Status: DC

## 2020-05-12 MED ORDER — POTASSIUM CHLORIDE IN NACL 20-0.9 MEQ/L-% IV SOLN
Freq: Once | INTRAVENOUS | Status: AC
  Filled 2020-05-12: qty 1000

## 2020-05-12 MED ORDER — SODIUM CHLORIDE 0.9 % IV SOLN
150.0000 mg | Freq: Once | INTRAVENOUS | Status: AC
  Administered 2020-05-12: 150 mg via INTRAVENOUS
  Filled 2020-05-12: qty 150

## 2020-05-12 MED ORDER — SODIUM CHLORIDE 0.9 % IV SOLN
10.0000 mg | Freq: Once | INTRAVENOUS | Status: AC
  Administered 2020-05-12: 10 mg via INTRAVENOUS
  Filled 2020-05-12: qty 10

## 2020-05-12 MED ORDER — SODIUM CHLORIDE 0.9 % IV SOLN
40.0000 mg/m2 | Freq: Once | INTRAVENOUS | Status: AC
  Administered 2020-05-12: 91 mg via INTRAVENOUS
  Filled 2020-05-12: qty 91

## 2020-05-12 MED ORDER — PALONOSETRON HCL INJECTION 0.25 MG/5ML
0.2500 mg | Freq: Once | INTRAVENOUS | Status: AC
  Administered 2020-05-12: 0.25 mg via INTRAVENOUS
  Filled 2020-05-12: qty 5

## 2020-05-12 NOTE — Patient Instructions (Signed)
[facility] °Discharge Instructions for Patients Receiving Chemotherapy ° ° °Beginning January 23rd 2017 lab work for the Cancer Center will be done in the  °Main lab at [facility] on 1st floor. °If you have a lab appointment with the Cancer Center please come in thru the  °Main Entrance and check in at the main information desk ° ° °Today you received the following chemotherapy agents Cisplatin. Follow-up as scheduled ° °To help prevent nausea and vomiting after your treatment, we encourage you to take your nausea medication °  °If you develop nausea and vomiting, or diarrhea that is not controlled by your medication, call the clinic. ° °The clinic phone number is [phone]. Office hours are Monday-Friday 8:30am-5:00pm. ° °BELOW ARE SYMPTOMS THAT SHOULD BE REPORTED IMMEDIATELY: °· *FEVER GREATER THAN 101.0 F °· *CHILLS WITH OR WITHOUT FEVER °· NAUSEA AND VOMITING THAT IS NOT CONTROLLED WITH YOUR NAUSEA MEDICATION °· *UNUSUAL SHORTNESS OF BREATH °· *UNUSUAL BRUISING OR BLEEDING °· TENDERNESS IN MOUTH AND THROAT WITH OR WITHOUT PRESENCE OF ULCERS °· *URINARY PROBLEMS °· *BOWEL PROBLEMS °· UNUSUAL RASH °Items with * indicate a potential emergency and should be followed up as soon as possible. °If you have an emergency after office hours please contact your primary care physician or go to the nearest emergency department. ° °Please call the clinic during office hours if you have any questions or concerns.  ° °You may also contact the Patient Navigator at [phone] should you have any questions or need assistance in obtaining follow up care. ° ° ° ° ° °Resources For Cancer Patients and their Caregivers °? American Cancer Society: °Can assist with transportation, wigs, general needs, runs Look Good Feel Better.        °1-888-227-6333 °? Cancer Care: °Provides financial assistance, online support groups, medication/co-pay assistance.  °1-800-813-HOPE (4673) °? [facility] °Assists [place] cancer patients and their families through emotional , educational and financial support.  336-427-4357 °? [place] DSS °Where to apply for food stamps, Medicaid and utility assistance. °336-342-1394 °? [facility]: °Transportation to medical appointments. 336-347-2287 °? Social Security Administration: °May apply for disability if have a Stage IV cancer. 336-342-7796 1-800-772-1213 °? [place] [facility]: °Assists with nutrition, care and transit needs. 336-349-2343 °  ° ° ° ° ° ° °

## 2020-05-12 NOTE — Progress Notes (Signed)
Veronica Horton tolerated Cisplatin infusion well without complaints or incident. Labs from 05/10/20 reviewed prior to administering this medication. Peripheral IV site checked by 2 RN's with positive blood return noted prior to and after infusion. VSS upon discharge. Pt discharged via wheelchair in satisfactory condition accompanied by family member

## 2020-05-12 NOTE — Progress Notes (Signed)
[facility] CSW Progress Notes  Call from sister, [name], requesting update on patient's Medicaid application.  Advised that family needs to contact [place] or [employer] for any updates on Medicaid.  Sister has contact information for DSS.  Edwyna Shell, LCSW Clinical Social Worker Phone:  [phone]

## 2020-05-13 ENCOUNTER — Ambulatory Visit: Payer: Medicaid Other

## 2020-05-13 ENCOUNTER — Telehealth: Payer: Self-pay | Admitting: Hematology and Oncology

## 2020-05-13 NOTE — Telephone Encounter (Signed)
Rescheduled appointment per MD request. Spoke to patient's sister, [name], who is aware of appointments date and times.

## 2020-05-14 ENCOUNTER — Ambulatory Visit: Payer: Medicaid Other

## 2020-05-14 ENCOUNTER — Inpatient Hospital Stay: Payer: Medicaid Other | Admitting: Hematology and Oncology

## 2020-05-14 ENCOUNTER — Inpatient Hospital Stay: Payer: Medicaid Other

## 2020-05-14 ENCOUNTER — Ambulatory Visit
Admission: RE | Admit: 2020-05-14 | Discharge: 2020-05-14 | Disposition: A | Discharge status: Home / Self Care | Payer: Medicaid Other | Source: Ambulatory Visit | Attending: Radiation Oncology | Admitting: Radiation Oncology

## 2020-05-14 DIAGNOSIS — C760 Malignant neoplasm of head, face and neck: Secondary | ICD-10-CM | POA: Diagnosis not present

## 2020-05-17 ENCOUNTER — Ambulatory Visit: Payer: Medicaid Other

## 2020-05-18 ENCOUNTER — Inpatient Hospital Stay: Payer: Medicaid Other | Admitting: Nutrition

## 2020-05-18 ENCOUNTER — Inpatient Hospital Stay: Payer: Medicaid Other | Admitting: Hematology and Oncology

## 2020-05-18 ENCOUNTER — Ambulatory Visit: Payer: Medicaid Other

## 2020-05-18 ENCOUNTER — Other Ambulatory Visit: Payer: Medicaid Other

## 2020-05-18 ENCOUNTER — Inpatient Hospital Stay: Payer: Medicaid Other

## 2020-05-18 ENCOUNTER — Other Ambulatory Visit: Payer: Self-pay | Admitting: Hematology and Oncology

## 2020-05-18 ENCOUNTER — Telehealth: Payer: Self-pay | Admitting: Hematology and Oncology

## 2020-05-18 ENCOUNTER — Ambulatory Visit: Payer: Medicaid Other | Admitting: Hematology and Oncology

## 2020-05-18 NOTE — Telephone Encounter (Signed)
Rescheduled appointments due to weather. Scheduled infusion on Friday 1/21 per RN Seth Bake, and scheduled lab/MD visit on 1/20 per Dr. Rob Hickman schedule. Spoke to patient's sister, [name], who is aware of appointments. She informed me that it is still hard for them to get our because of the snow and ice and she will let us know if they are unable to make appointments day of.

## 2020-05-19 ENCOUNTER — Ambulatory Visit: Payer: Medicaid Other

## 2020-05-20 ENCOUNTER — Ambulatory Visit: Payer: Medicaid Other

## 2020-05-20 ENCOUNTER — Inpatient Hospital Stay: Payer: Medicaid Other

## 2020-05-20 ENCOUNTER — Telehealth: Payer: Self-pay | Admitting: Hematology and Oncology

## 2020-05-20 ENCOUNTER — Inpatient Hospital Stay: Payer: Medicaid Other | Admitting: Hematology and Oncology

## 2020-05-20 NOTE — Telephone Encounter (Signed)
Patient called to cancel and reschedule appointments for this week due to weather concerns. Spoke to MD who has stated patient has rescheduled appointments multiple times at this point. Infusion is unable to schedule for next week due to capacity. Will keep an eye on schedule for any cancellations. Reschedule appointments pending at this time

## 2020-05-21 ENCOUNTER — Ambulatory Visit: Payer: Medicaid Other

## 2020-05-21 ENCOUNTER — Inpatient Hospital Stay: Payer: Medicaid Other

## 2020-05-21 ENCOUNTER — Ambulatory Visit: Payer: Medicaid Other | Admitting: Hematology and Oncology

## 2020-05-21 ENCOUNTER — Other Ambulatory Visit: Payer: Medicaid Other

## 2020-05-24 ENCOUNTER — Ambulatory Visit: Payer: Medicaid Other

## 2020-05-24 ENCOUNTER — Telehealth: Payer: Self-pay

## 2020-05-24 ENCOUNTER — Ambulatory Visit
Admission: RE | Admit: 2020-05-24 | Discharge: 2020-05-24 | Disposition: A | Discharge status: Home / Self Care | Payer: Medicaid Other | Source: Ambulatory Visit | Attending: Radiation Oncology | Admitting: Radiation Oncology

## 2020-05-24 DIAGNOSIS — C321 Malignant neoplasm of supraglottis: Secondary | ICD-10-CM

## 2020-05-24 DIAGNOSIS — C760 Malignant neoplasm of head, face and neck: Secondary | ICD-10-CM | POA: Diagnosis not present

## 2020-05-24 MED ORDER — SONAFINE EX EMUL
1.0000 "application " | Freq: Two times a day (BID) | CUTANEOUS | Status: DC
  Administered 2020-05-24: 1 via TOPICAL

## 2020-05-24 NOTE — Telephone Encounter (Signed)
Spoke with nurse for patient's PCP (Dr. Abran Richard at [facility]). Relayed patient's increase in lower extremity edema that has started weeping in her right leg. Nurse stated she would pass information along to Dr. Yong Channel, and call patient directly once she had Dr. Ansel Bong recommendations. Will plan to check Care Everywhere tab Friday to see if any updates have been documented by patient's PCP. Dr. Chryl Heck was also made aware since patient will be seeing her tomorrow before her infusion appointment.

## 2020-05-25 ENCOUNTER — Telehealth: Payer: Self-pay | Admitting: Hematology and Oncology

## 2020-05-25 ENCOUNTER — Inpatient Hospital Stay: Payer: Medicaid Other

## 2020-05-25 ENCOUNTER — Ambulatory Visit: Payer: Medicaid Other

## 2020-05-25 ENCOUNTER — Other Ambulatory Visit (HOSPITAL_COMMUNITY): Payer: Self-pay | Admitting: Internal Medicine

## 2020-05-25 ENCOUNTER — Other Ambulatory Visit: Payer: Self-pay

## 2020-05-25 ENCOUNTER — Inpatient Hospital Stay: Payer: Medicaid Other | Admitting: Nutrition

## 2020-05-25 ENCOUNTER — Ambulatory Visit (HOSPITAL_COMMUNITY)
Admission: RE | Admit: 2020-05-25 | Discharge: 2020-05-25 | Disposition: A | Discharge status: Home / Self Care | Payer: Medicaid Other | Source: Ambulatory Visit | Attending: Internal Medicine | Admitting: Internal Medicine

## 2020-05-25 ENCOUNTER — Other Ambulatory Visit: Payer: Self-pay | Admitting: Hematology and Oncology

## 2020-05-25 ENCOUNTER — Inpatient Hospital Stay: Payer: Medicaid Other | Admitting: Hematology and Oncology

## 2020-05-25 ENCOUNTER — Other Ambulatory Visit: Payer: Self-pay | Admitting: Internal Medicine

## 2020-05-25 DIAGNOSIS — M79661 Pain in right lower leg: Secondary | ICD-10-CM

## 2020-05-25 DIAGNOSIS — M7989 Other specified soft tissue disorders: Secondary | ICD-10-CM | POA: Diagnosis present

## 2020-05-25 NOTE — Progress Notes (Deleted)
[facility]   Telephone:([phone] [phone]) [phone]   Clinic Follow up Note   Patient Care Team: [facility] as PCP - General 05/25/2020  CHIEF COMPLAINT: Follow up SCC of supraglottis   SUMMARY OF ONCOLOGIC HISTORY: Oncology History  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[place])  03/18/2020 Initial Diagnosis   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[place])   03/22/2020 Cancer Staging   Staging form: Larynx - Supraglottis, AJCC 7th Edition - Clinical: Stage IVA (T4a, N2c, M0) - Signed by Eppie Gibson, MD on 03/22/2020   03/23/2020 - 03/23/2020 Chemotherapy   The patient had dexamethasone (DECADRON) 4 MG tablet, 8 mg, Oral, Daily, 0 of 1 cycle, Start date: --, End date: -- palonosetron (ALOXI) injection 0.25 mg, 0.25 mg, Intravenous,  Once, 0 of 10 cycles CISplatin (PLATINOL) 182 mg in sodium chloride 0.9 % 500 mL chemo infusion, 80 mg/m2, Intravenous,  Once, 0 of 10 cycles gemcitabine (GEMZAR) 2,280 mg in sodium chloride 0.9 % 250 mL chemo infusion, 1,000 mg/m2, Intravenous,  Once, 0 of 3 cycles fosaprepitant (EMEND) 150 mg in sodium chloride 0.9 % 145 mL IVPB, 150 mg, Intravenous,  Once, 0 of 10 cycles  for chemotherapy treatment.    03/29/2020 -  Chemotherapy    Patient is on Treatment Plan: HEAD AND NECK CISPLATIN Q7D + XRT        CURRENT THERAPY: ChemoRT with Cisplatin   INTERVAL HISTORY:     MEDICAL HISTORY:  Past Medical History:  Diagnosis Date  . Bronchitis   . Class 3 obesity 01/23/2020  . COPD (chronic obstructive pulmonary disease) ([place])   . Degenerative disc disease, lumbar   . Hiatal hernia   . Hypertension     SURGICAL HISTORY: Past Surgical History:  Procedure Laterality Date  . BIOPSY  04/12/2020   Procedure: BIOPSY;  Surgeon: Wilford Corner, MD;  Location: [facility];  Service: Gastroenterology;;  . CHOLECYSTECTOMY    . degenerative bone disease    . DIRECT LARYNGOSCOPY N/A  03/13/2020   Procedure: DIRECT LARYNGOSCOPY WITH BIOPSY;  Surgeon: Jason Coop, DO;  Location: [facility];  Service: ENT;  Laterality: N/A;  . FLEXIBLE SIGMOIDOSCOPY N/A 04/12/2020   Procedure: FLEXIBLE SIGMOIDOSCOPY;  Surgeon: Wilford Corner, MD;  Location: [facility];  Service: Gastroenterology;  Laterality: N/A;  . INCISIONAL HERNIA REPAIR N/A 01/15/2019   Procedure: [name] HERNIORRHAPHY WITH MESH;  Surgeon: [name], MD;  Location: [facility];  Service: General;  Laterality: N/A;  . IR GASTROSTOMY TUBE MOD SED  03/22/2020  . IR RADIOLOGIST EVAL & MGMT  05/07/2020  . OMENTECTOMY N/A 01/15/2019   Procedure: OMENTECTOMY;  Surgeon: [name], MD;  Location: [facility];  Service: General;  Laterality: N/A;  . POLYPECTOMY  04/12/2020   Procedure: POLYPECTOMY;  Surgeon: Wilford Corner, MD;  Location: [facility];  Service: Gastroenterology;;  . TRACHEOSTOMY TUBE PLACEMENT N/A 03/13/2020   Procedure: AWAKE TRACHEOSTOMY;  Surgeon: Jason Coop, DO;  Location: [facility];  Service: ENT;  Laterality: N/A;    I have reviewed the social history and family history with the patient and they are unchanged from previous note.  ALLERGIES:  is allergic to codeine and penicillins.  MEDICATIONS:  Current Outpatient Medications  Medication Sig Dispense Refill  . diphenoxylate-atropine (LOMOTIL) 2.5-0.025 MG tablet Take 1 tablet by mouth 4 (four) times daily as needed for diarrhea or loose stools. 30 tablet 1  . Albuterol Sulfate (PROAIR RESPICLICK) 831 (90 Base) MCG/ACT AEPB See admin  instructions.    Marland Kitchen arformoterol (BROVANA) 15 MCG/2ML NEBU Take 2 mLs (15 mcg total) by nebulization 2 (two) times daily. 120 mL 2  . bisacodyl (DULCOLAX) 10 MG suppository Place 1 suppository (10 mg total) rectally daily as needed for severe constipation. 12 suppository 0  . budesonide (PULMICORT) 0.5 MG/2ML nebulizer solution Take 2 mLs (0.5 mg total) by nebulization 2 (two) times daily. 120 mL 2  .  dexamethasone (DECADRON) 2 MG tablet Take 2 tablets (4 mg total) by mouth 2 (two) times daily. 30 tablet 0  . diclofenac Sodium (VOLTAREN) 1 % GEL Apply 2 g topically 4 (four) times daily as needed (neck/back pain). 350 g 0  . diphenhydrAMINE (BENADRYL) 12.5 MG/5ML elixir Place 5 mLs (12.5 mg total) into feeding tube every 6 (six) hours as needed for itching. 120 mL 0  . escitalopram (LEXAPRO) 5 MG tablet Take 1 tablet (5 mg total) by mouth daily. 30 tablet 2  . feeding supplement, ENSURE ENLIVE, (ENSURE ENLIVE) LIQD Take 237 mLs by mouth 2 (two) times daily between meals. 161 mL 12  . folic acid (FOLVITE) 1 MG tablet Take 1 tablet (1 mg total) by mouth daily. 30 tablet 2  . furosemide (LASIX) 40 MG tablet Take 40 mg by mouth daily.    Marland Kitchen gabapentin (NEURONTIN) 300 MG capsule Take 2 capsules (600 mg total) by mouth at bedtime.    Marland Kitchen guaiFENesin-dextromethorphan (ROBITUSSIN DM) 100-10 MG/5ML syrup Take 10 mLs by mouth every 4 (four) hours as needed for cough. 118 mL 0  . hydrOXYzine (ATARAX/VISTARIL) 10 MG tablet Place 1 tablet (10 mg total) into feeding tube 3 (three) times daily as needed for anxiety. 30 tablet 2  . ipratropium-albuterol (DUONEB) 0.5-2.5 (3) MG/3ML SOLN Take 3 mLs by nebulization every 6 (six) hours as needed. 360 mL 2  . ketotifen (ZADITOR) 0.025 % ophthalmic solution Place 1 drop into both eyes 2 (two) times daily. 5 mL 0  . lidocaine (LIDODERM) 5 % Place 1 patch onto the skin daily. Remove & Discard patch within 12 hours or as directed by MD 30 patch 0  . melatonin 5 MG TABS Take 1 tablet (5 mg total) by mouth at bedtime. 30 tablet 2  . midodrine (PROAMATINE) 5 MG tablet Take 1 tablet (5 mg total) by mouth 2 (two) times daily with a meal. 60 tablet 2  . ondansetron (ZOFRAN) 8 MG tablet Take 1 tablet (8 mg total) by mouth 2 (two) times daily as needed. Start on the third day after chemotherapy. 30 tablet 1  . oxyCODONE (OXY IR/ROXICODONE) 5 MG immediate release tablet Place 1 tablet  (5 mg total) into feeding tube every 6 (six) hours as needed for severe pain. 15 tablet 0  . pantoprazole sodium (PROTONIX) 40 mg/20 mL PACK Place 20 mLs (40 mg total) into feeding tube daily. 600 mL 2  . polyethylene glycol (MIRALAX / GLYCOLAX) 17 g packet Place 17 g into feeding tube 2 (two) times daily as needed for moderate constipation. 14 each 0  . prochlorperazine (COMPAZINE) 10 MG tablet Take 1 tablet (10 mg total) by mouth every 6 (six) hours as needed (Nausea or vomiting). 30 tablet 1  . saccharomyces boulardii (FLORASTOR) 250 MG capsule Take 1 capsule (250 mg total) by mouth 2 (two) times daily. 60 capsule 0  . scopolamine (TRANSDERM-SCOP) 1 MG/3DAYS Place 1 patch (1.5 mg total) onto the skin every 3 (three) days. 10 patch 12  . vitamin B-12 (CYANOCOBALAMIN) 500 MCG tablet Take  1 tablet (500 mcg total) by mouth daily. 30 tablet 3   No current facility-administered medications for this visit.    PHYSICAL EXAMINATION: ECOG PERFORMANCE STATUS: 2 - Symptomatic, <50% confined to bed  There were no vitals filed for this visit. There were no vitals filed for this visit.  GENERAL:alert, no distress and comfortable SKIN: no rash  EYES: sclera clear LUNGS: clear with normal breathing effort. Trach in place. Supplemental oxygen not in use  HEART: regular rate & rhythm. Marked pitting edema with weeping to bilateral lower extremities. No significant erythema or warmth  ABDOMEN:abdomen soft, non-tender and normal bowel sounds. Feeding tube in place/capped  NEURO: alert & oriented x 3 with fluent speech   LABORATORY DATA:  I have reviewed the data as listed CBC Latest Ref Rng & Units 05/10/2020 05/07/2020 04/30/2020  WBC 4.0 - 10.5 K/uL 4.7 4.1 6.8  Hemoglobin 12.0 - 15.0 g/dL 9.8(L) 9.7(L) 9.9(L)  Hematocrit 36.0 - 46.0 % 29.5(L) 30.1(L) 31.4(L)  Platelets 150 - 400 K/uL 179 186 121(L)     CMP Latest Ref Rng & Units 05/10/2020 05/07/2020 04/30/2020  Glucose 70 - 99 mg/dL 97 87 120(H)   BUN 6 - 20 mg/dL 22(H) 23(H) 32(H)  Creatinine 0.44 - 1.00 mg/dL 1.39(H) 1.73(H) 1.15(H)  Sodium 135 - 145 mmol/L 138 142 138  Potassium 3.5 - 5.1 mmol/L 3.8 4.3 5.8(H)  Chloride 98 - 111 mmol/L 102 103 103  CO2 22 - 32 mmol/L 27 28 26   Calcium 8.9 - 10.3 mg/dL 8.9 9.3 9.0  Total Protein 6.5 - 8.1 g/dL 6.9 6.9 -  Total Bilirubin 0.3 - 1.2 mg/dL 0.5 0.6 -  Alkaline Phos 38 - 126 U/L 48 45 -  AST 15 - 41 U/L 9(L) 11(L) -  ALT 0 - 44 U/L 9 11 -      RADIOGRAPHIC STUDIES: I have personally reviewed the radiological images as listed and agreed with the findings in the report. No results found.   ASSESSMENT & PLAN: 60 year old female  1.  Squamous cell carcinoma of the supraglottis, [id number], stage IVA -Began concurrent chemoradiation with cisplatin on 03/29/2020, -She has had chemo interruption for pneumonia and renal dysfunction and also partly driven by patient, rescheduled a few times. -Clinically she is improving on treatment -Renal function has improved, plan to proceed with cisplatin this week. Plan was reviewed with Dr. Burr Medico  2.  Respiratory -Trach in place with supplemental oxygen 8 L -Today's respiratory exam is benign, she was off oxygen to ambulate to the bathroom, no respiratory distress on room air.  I recommend to try to wean O2 to 4-6 L  3.  Leg edema, worsening, she hasnt been wrapping her legs like recommended. - on Lasix 40 mg once daily -Albumin 3.2 -exam not c/w DVT, will hold doppler   4.  Renal dysfunction Creatinine improving  5. Social support -medicaid application is pending -home health through [facility], patient's sister notes a payment is being requested but their funds are low.    Plan: -Labs pending -Recommend to start compression stockings for leg edema, if not able to find a good fit will refer to wound care/home health for wrapping -Follow-up with social work/financial for Medicaid application -Wean O2 as tolerated, 4-6 L  -FU in  1 week. All questions were answered. The patient knows to call the clinic with any problems, questions or concerns. No barriers to learning were detected. Total encounter time was 30 minutes.      Elyana Grabski  Detron Carras, MD 05/25/20

## 2020-05-25 NOTE — Telephone Encounter (Signed)
Scheduled appointments on 1/28 per secure chat with MD. Veronica Horton to patient's sister, Veronica Horton, who said they had to miss appointments today due to Veronica Horton having some trouble with her leg. I transferred the call to desk nurse so Veronica Horton could explain in more detail what the situation was. I sent a msg to Dr. Chryl Heck who told me via secure chat to schedule patient this Friday for lab/MD visit. No mention was made of rescheduling chemo at this time as infusion is booked.

## 2020-05-26 ENCOUNTER — Other Ambulatory Visit: Payer: Self-pay

## 2020-05-26 ENCOUNTER — Ambulatory Visit: Payer: Medicaid Other

## 2020-05-26 ENCOUNTER — Other Ambulatory Visit: Payer: Self-pay | Admitting: Hematology and Oncology

## 2020-05-26 ENCOUNTER — Ambulatory Visit
Admission: RE | Admit: 2020-05-26 | Discharge: 2020-05-26 | Disposition: A | Discharge status: Home / Self Care | Payer: Medicaid Other | Source: Ambulatory Visit | Attending: Radiation Oncology | Admitting: Radiation Oncology

## 2020-05-26 ENCOUNTER — Other Ambulatory Visit (HOSPITAL_COMMUNITY): Payer: Medicaid Other

## 2020-05-26 DIAGNOSIS — C760 Malignant neoplasm of head, face and neck: Secondary | ICD-10-CM | POA: Diagnosis not present

## 2020-05-26 MED ORDER — APIXABAN (ELIQUIS) VTE STARTER PACK (10MG AND 5MG): ORAL_TABLET | ORAL | 0 refills | Status: DC

## 2020-05-26 NOTE — Progress Notes (Signed)
We were called from ED that pt has a new diagnosis of DVT but left ER AMA. She wasn't started on any blood thinners ELiquis prescribed to pharmacy of her choice Our NN called the patient and discussed the importance of taking the blood thinners. All blood thinners come with inherent risk of bleeding but given acute DVT and active malignancy, benefit of blood thinners outweigh the risks.  Veronica Horton

## 2020-05-27 ENCOUNTER — Other Ambulatory Visit: Payer: Self-pay

## 2020-05-27 ENCOUNTER — Ambulatory Visit
Admission: RE | Admit: 2020-05-27 | Discharge: 2020-05-27 | Disposition: A | Discharge status: Home / Self Care | Payer: Medicaid Other | Source: Ambulatory Visit | Attending: Radiation Oncology | Admitting: Radiation Oncology

## 2020-05-27 DIAGNOSIS — C760 Malignant neoplasm of head, face and neck: Secondary | ICD-10-CM | POA: Diagnosis not present

## 2020-05-28 ENCOUNTER — Inpatient Hospital Stay: Payer: Medicaid Other

## 2020-05-28 ENCOUNTER — Encounter: Payer: Self-pay | Admitting: Hematology and Oncology

## 2020-05-28 ENCOUNTER — Inpatient Hospital Stay (HOSPITAL_BASED_OUTPATIENT_CLINIC_OR_DEPARTMENT_OTHER): Payer: Medicaid Other | Admitting: Hematology and Oncology

## 2020-05-28 ENCOUNTER — Other Ambulatory Visit: Payer: Self-pay

## 2020-05-28 ENCOUNTER — Inpatient Hospital Stay: Payer: Medicaid Other | Admitting: Hematology and Oncology

## 2020-05-28 ENCOUNTER — Ambulatory Visit
Admission: RE | Admit: 2020-05-28 | Discharge: 2020-05-28 | Disposition: A | Discharge status: Home / Self Care | Payer: Medicaid Other | Source: Ambulatory Visit | Attending: Radiation Oncology | Admitting: Radiation Oncology

## 2020-05-28 ENCOUNTER — Ambulatory Visit: Payer: Medicaid Other

## 2020-05-28 VITALS — BP 113/53 | HR 64 | Temp 98.0°F | Resp 18 | Ht 65.0 in | Wt 245.2 lb

## 2020-05-28 DIAGNOSIS — C321 Malignant neoplasm of supraglottis: Secondary | ICD-10-CM

## 2020-05-28 DIAGNOSIS — C760 Malignant neoplasm of head, face and neck: Secondary | ICD-10-CM | POA: Diagnosis not present

## 2020-05-28 LAB — COMPREHENSIVE METABOLIC PANEL
ALT: 7 U/L (ref 0–44)
AST: 11 U/L — ABNORMAL LOW (ref 15–41)
Albumin: 2.9 g/dL — ABNORMAL LOW (ref 3.5–5.0)
Alkaline Phosphatase: 65 U/L (ref 38–126)
Anion gap: 9 (ref 5–15)
BUN: 22 mg/dL — ABNORMAL HIGH (ref 6–20)
CO2: 32 mmol/L (ref 22–32)
Calcium: 9.2 mg/dL (ref 8.9–10.3)
Chloride: 99 mmol/L (ref 98–111)
Creatinine, Ser: 1.58 mg/dL — ABNORMAL HIGH (ref 0.44–1.00)
GFR, Estimated: 37 mL/min — ABNORMAL LOW (ref >60)
Glucose, Bld: 86 mg/dL (ref 70–99)
Potassium: 4 mmol/L (ref 3.5–5.1)
Sodium: 140 mmol/L (ref 135–145)
Total Bilirubin: 1.2 mg/dL (ref 0.3–1.2)
Total Protein: 6.6 g/dL (ref 6.5–8.1)

## 2020-05-28 LAB — CBC WITH DIFFERENTIAL/PLATELET
Abs Immature Granulocytes: 0.03 10*3/uL (ref 0.00–0.07)
Basophils Absolute: 0 10*3/uL (ref 0.0–0.1)
Basophils Relative: 0 %
Eosinophils Absolute: 0 10*3/uL (ref 0.0–0.5)
Eosinophils Relative: 1 %
HCT: 29.2 % — ABNORMAL LOW (ref 36.0–46.0)
Hemoglobin: 9.4 g/dL — ABNORMAL LOW (ref 12.0–15.0)
Immature Granulocytes: 1 %
Lymphocytes Relative: 10 %
Lymphs Abs: 0.4 10*3/uL — ABNORMAL LOW (ref 0.7–4.0)
MCH: 31.8 pg (ref 26.0–34.0)
MCHC: 32.2 g/dL (ref 30.0–36.0)
MCV: 98.6 fL (ref 80.0–100.0)
Monocytes Absolute: 0.5 10*3/uL (ref 0.1–1.0)
Monocytes Relative: 12 %
Neutro Abs: 3.2 10*3/uL (ref 1.7–7.7)
Neutrophils Relative %: 76 %
Platelets: 157 10*3/uL (ref 150–400)
RBC: 2.96 MIL/uL — ABNORMAL LOW (ref 3.87–5.11)
RDW: 17.5 % — ABNORMAL HIGH (ref 11.5–15.5)
WBC: 4.2 10*3/uL (ref 4.0–10.5)
nRBC: 0 % (ref 0.0–0.2)

## 2020-05-28 NOTE — Progress Notes (Signed)
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[place])   03/22/2020 Cancer Staging   Staging form: Larynx - Supraglottis, AJCC 7th Edition - Clinical: Stage IVA (T4a, N2c, M0) - Signed by Eppie Gibson, MD on 03/22/2020   03/23/2020 - 03/23/2020 Chemotherapy   The patient had dexamethasone (DECADRON) 4 MG tablet, 8 mg, Oral, Daily, 0 of 1 cycle, Start date: --, End date: -- palonosetron (ALOXI) injection 0.25 mg, 0.25 mg, Intravenous,  Once, 0 of 10 cycles CISplatin (PLATINOL) 182 mg in sodium chloride 0.9 % 500 mL chemo infusion, 80 mg/m2, Intravenous,  Once, 0 of 10 cycles gemcitabine (GEMZAR) 2,280 mg in sodium chloride 0.9 % 250 mL chemo infusion, 1,000 mg/m2, Intravenous,  Once, 0 of 3 cycles fosaprepitant (EMEND) 150 mg in sodium chloride 0.9 % 145 mL IVPB, 150 mg, Intravenous,  Once, 0 of 10 cycles  for chemotherapy treatment.    03/29/2020 -  Chemotherapy    Patient is on Treatment Plan: HEAD AND NECK CISPLATIN Q7D + XRT        CURRENT THERAPY: ChemoRT with Cisplatin   INTERVAL HISTORY:   She is here for FU, multiple no shows and cancellations in the past. She is doing good today Taking eliquis for DVT, no bleeding issues Right leg still swollen and tender, oozing clear fluid. No chest pain, SOB Neck lymph nodes have resolved On oxygen 5 L which is her baseline. No diarrhea, dysuria.  She feels well over all. Rest of the pertinent 10 point ROS reviewed and negative  MEDICAL HISTORY:  Past Medical History:  Diagnosis Date  . Bronchitis   . Class 3 obesity 01/23/2020  . COPD (chronic obstructive  pulmonary disease) ([place])   . Degenerative disc disease, lumbar   . Hiatal hernia   . Hypertension     SURGICAL HISTORY: Past Surgical History:  Procedure Laterality Date  . BIOPSY  04/12/2020   Procedure: BIOPSY;  Surgeon: Wilford Corner, MD;  Location: [facility];  Service: Gastroenterology;;  . CHOLECYSTECTOMY    . degenerative bone disease    . DIRECT LARYNGOSCOPY N/A 03/13/2020   Procedure: DIRECT LARYNGOSCOPY WITH BIOPSY;  Surgeon: Jason Coop, DO;  Location: [facility];  Service: ENT;  Laterality: N/A;  . FLEXIBLE SIGMOIDOSCOPY N/A 04/12/2020   Procedure: FLEXIBLE SIGMOIDOSCOPY;  Surgeon: Wilford Corner, MD;  Location: [facility];  Service: Gastroenterology;  Laterality: N/A;  . INCISIONAL HERNIA REPAIR N/A 01/15/2019   Procedure: [name] HERNIORRHAPHY WITH MESH;  Surgeon: [name], MD;  Location: [facility];  Service: General;  Laterality: N/A;  . IR GASTROSTOMY TUBE MOD SED  03/22/2020  . IR RADIOLOGIST EVAL & MGMT  05/07/2020  . OMENTECTOMY N/A 01/15/2019   Procedure: OMENTECTOMY;  Surgeon: [name], MD;  Location: [facility];  Service: General;  Laterality: N/A;  . POLYPECTOMY  04/12/2020   Procedure: POLYPECTOMY;  Surgeon: Wilford Corner, MD;  Location: [facility];  Service: Gastroenterology;;  . TRACHEOSTOMY TUBE PLACEMENT N/A 03/13/2020   Procedure: AWAKE TRACHEOSTOMY;  Surgeon: Jason Coop, DO;  Location: [place];  Service: ENT;  Laterality: N/A;    I have reviewed the social history and family history  with the patient and they are unchanged from previous note.  ALLERGIES:  is allergic to codeine and penicillins.  MEDICATIONS:  Current Outpatient Medications  Medication Sig Dispense Refill  . diphenoxylate-atropine (LOMOTIL) 2.5-0.025 MG tablet Take 1 tablet by mouth 4 (four) times daily as needed for diarrhea or loose stools. 30 tablet 1  . Albuterol Sulfate (PROAIR RESPICLICK) 782 (90 Base) MCG/ACT AEPB See admin instructions.    .  APIXABAN (ELIQUIS) VTE STARTER PACK (10MG  AND 5MG ) Take as directed on package: start with two-5mg  tablets twice daily for 7 days. On day 8, switch to one-5mg  tablet twice daily. 1 each 0  . arformoterol (BROVANA) 15 MCG/2ML NEBU Take 2 mLs (15 mcg total) by nebulization 2 (two) times daily. 120 mL 2  . bisacodyl (DULCOLAX) 10 MG suppository Place 1 suppository (10 mg total) rectally daily as needed for severe constipation. 12 suppository 0  . budesonide (PULMICORT) 0.5 MG/2ML nebulizer solution Take 2 mLs (0.5 mg total) by nebulization 2 (two) times daily. 120 mL 2  . dexamethasone (DECADRON) 2 MG tablet Take 2 tablets (4 mg total) by mouth 2 (two) times daily. 30 tablet 0  . diclofenac Sodium (VOLTAREN) 1 % GEL Apply 2 g topically 4 (four) times daily as needed (neck/back pain). 350 g 0  . diphenhydrAMINE (BENADRYL) 12.5 MG/5ML elixir Place 5 mLs (12.5 mg total) into feeding tube every 6 (six) hours as needed for itching. 120 mL 0  . escitalopram (LEXAPRO) 5 MG tablet Take 1 tablet (5 mg total) by mouth daily. 30 tablet 2  . feeding supplement, ENSURE ENLIVE, (ENSURE ENLIVE) LIQD Take 237 mLs by mouth 2 (two) times daily between meals. 956 mL 12  . folic acid (FOLVITE) 1 MG tablet Take 1 tablet (1 mg total) by mouth daily. 30 tablet 2  . furosemide (LASIX) 40 MG tablet Take 40 mg by mouth daily.    Marland Kitchen gabapentin (NEURONTIN) 300 MG capsule Take 2 capsules (600 mg total) by mouth at bedtime.    Marland Kitchen guaiFENesin-dextromethorphan (ROBITUSSIN DM) 100-10 MG/5ML syrup Take 10 mLs by mouth every 4 (four) hours as needed for cough. 118 mL 0  . hydrOXYzine (ATARAX/VISTARIL) 10 MG tablet Place 1 tablet (10 mg total) into feeding tube 3 (three) times daily as needed for anxiety. 30 tablet 2  . ipratropium-albuterol (DUONEB) 0.5-2.5 (3) MG/3ML SOLN Take 3 mLs by nebulization every 6 (six) hours as needed. 360 mL 2  . ketotifen (ZADITOR) 0.025 % ophthalmic solution Place 1 drop into both eyes 2 (two) times daily. 5  mL 0  . lidocaine (LIDODERM) 5 % Place 1 patch onto the skin daily. Remove & Discard patch within 12 hours or as directed by MD 30 patch 0  . melatonin 5 MG TABS Take 1 tablet (5 mg total) by mouth at bedtime. 30 tablet 2  . midodrine (PROAMATINE) 5 MG tablet Take 1 tablet (5 mg total) by mouth 2 (two) times daily with a meal. 60 tablet 2  . ondansetron (ZOFRAN) 8 MG tablet Take 1 tablet (8 mg total) by mouth 2 (two) times daily as needed. Start on the third day after chemotherapy. 30 tablet 1  . oxyCODONE (OXY IR/ROXICODONE) 5 MG immediate release tablet Place 1 tablet (5 mg total) into feeding tube every 6 (six) hours as needed for severe pain. 15 tablet 0  . pantoprazole sodium (PROTONIX) 40 mg/20 mL PACK Place 20 mLs (40 mg total) into feeding tube daily. 600 mL 2  . polyethylene glycol (  MIRALAX / GLYCOLAX) 17 g packet Place 17 g into feeding tube 2 (two) times daily as needed for moderate constipation. 14 each 0  . prochlorperazine (COMPAZINE) 10 MG tablet Take 1 tablet (10 mg total) by mouth every 6 (six) hours as needed (Nausea or vomiting). 30 tablet 1  . saccharomyces boulardii (FLORASTOR) 250 MG capsule Take 1 capsule (250 mg total) by mouth 2 (two) times daily. 60 capsule 0  . scopolamine (TRANSDERM-SCOP) 1 MG/3DAYS Place 1 patch (1.5 mg total) onto the skin every 3 (three) days. 10 patch 12  . vitamin B-12 (CYANOCOBALAMIN) 500 MCG tablet Take 1 tablet (500 mcg total) by mouth daily. 30 tablet 3   No current facility-administered medications for this visit.    PHYSICAL EXAMINATION: ECOG PERFORMANCE STATUS: 2 - Symptomatic, <50% confined to bed  Vitals:   05/28/20 1142  BP: (!) 113/53  Pulse: 64  Resp: 18  Temp: 98 F (36.7 C)  SpO2: (!) 87%   Filed Weights   05/28/20 1142  Weight: 245 lb 3.2 oz (111.2 kg)    GENERAL:alert, no distress and comfortable SKIN: no rash  EYES: sclera clear Neck: Lymphadenopathy resolved, LUNGS: clear with normal breathing effort. Trach in  place. HEART: regular rate & rhythm.  Extremities: Marked pitting edema with weeping to bilateral lower extremities.  Chronic venous stasis changes and tenderness noted in R calf with palpation. ABDOMEN:abdomen soft, non-tender and normal bowel sounds. Feeding tube in place/capped, no drainage NEURO: alert & oriented x 3 with fluent speech   LABORATORY DATA:  I have reviewed the data as listed CBC Latest Ref Rng & Units 05/28/2020 05/10/2020 05/07/2020  WBC 4.0 - 10.5 K/uL 4.2 4.7 4.1  Hemoglobin 12.0 - 15.0 g/dL 9.4(L) 9.8(L) 9.7(L)  Hematocrit 36.0 - 46.0 % 29.2(L) 29.5(L) 30.1(L)  Platelets 150 - 400 K/uL 157 179 186     CMP Latest Ref Rng & Units 05/10/2020 05/07/2020 04/30/2020  Glucose 70 - 99 mg/dL 97 87 120(H)  BUN 6 - 20 mg/dL 22(H) 23(H) 32(H)  Creatinine 0.44 - 1.00 mg/dL 1.39(H) 1.73(H) 1.15(H)  Sodium 135 - 145 mmol/L 138 142 138  Potassium 3.5 - 5.1 mmol/L 3.8 4.3 5.8(H)  Chloride 98 - 111 mmol/L 102 103 103  CO2 22 - 32 mmol/L 27 28 26   Calcium 8.9 - 10.3 mg/dL 8.9 9.3 9.0  Total Protein 6.5 - 8.1 g/dL 6.9 6.9 -  Total Bilirubin 0.3 - 1.2 mg/dL 0.5 0.6 -  Alkaline Phos 38 - 126 U/L 48 45 -  AST 15 - 41 U/L 9(L) 11(L) -  ALT 0 - 44 U/L 9 11 -    RADIOGRAPHIC STUDIES: I have personally reviewed the radiological images as listed and agreed with the findings in the report. No results found.   ASSESSMENT & PLAN: 60 year old female  1.  Squamous cell carcinoma of the supraglottis, [id number], stage IVA -Began concurrent chemoradiation with cisplatin on 03/29/2020, -She has had chemo interruption for pneumonia and renal dysfunction and also partly driven by patient, rescheduled a few times , cancels and no shows several times. She is here for consideration of last cycle of chemotherapy. Unfortunately her GFR continues to be low, hence we will omit last cycle of chemotherapy She will complete radiation as planned.  2.  Respiratory On 5 L oxygen which is her baseline  requirement.  3.  Leg edema, worsening, she hasnt been wrapping her legs like recommended. DVT noted in the right popliteal vein Started on eliquis. Has some  ongoing tenderness, likely will improve with ongoing intake of eliquis. She was warned of risk of bleeding with eliquis.  4.  Renal dysfunction GFR in 30's, creatinine of 1.58, likely because of her other co-morbidities, she has some baseline creatinine impairment. Encouraged hydration, we will continue to monitor labs  We will also arrange for 500 ml NS on Monday,      Benay Pike, MD 05/28/20

## 2020-05-31 ENCOUNTER — Inpatient Hospital Stay: Payer: Medicaid Other | Admitting: Nutrition

## 2020-05-31 ENCOUNTER — Ambulatory Visit
Admission: RE | Admit: 2020-05-31 | Discharge: 2020-05-31 | Disposition: A | Discharge status: Home / Self Care | Payer: Medicaid Other | Source: Ambulatory Visit | Attending: Radiation Oncology | Admitting: Radiation Oncology

## 2020-05-31 ENCOUNTER — Encounter: Payer: Self-pay | Admitting: Radiation Oncology

## 2020-05-31 ENCOUNTER — Telehealth: Payer: Self-pay | Admitting: Hematology and Oncology

## 2020-05-31 ENCOUNTER — Other Ambulatory Visit: Payer: Self-pay

## 2020-05-31 ENCOUNTER — Inpatient Hospital Stay: Payer: Medicaid Other

## 2020-05-31 DIAGNOSIS — C321 Malignant neoplasm of supraglottis: Secondary | ICD-10-CM | POA: Diagnosis not present

## 2020-05-31 DIAGNOSIS — C760 Malignant neoplasm of head, face and neck: Secondary | ICD-10-CM | POA: Diagnosis not present

## 2020-05-31 MED ORDER — SODIUM CHLORIDE 0.9 % IV SOLN
Freq: Once | INTRAVENOUS | Status: AC
  Filled 2020-05-31: qty 250

## 2020-05-31 MED ORDER — SONAFINE EX EMUL
1.0000 "application " | Freq: Two times a day (BID) | CUTANEOUS | Status: DC
  Administered 2020-05-31: 1 via TOPICAL

## 2020-05-31 NOTE — Progress Notes (Signed)
Oncology Nurse Navigator Documentation  Met with Veronica Horton and her sister after final RT to offer support and to celebrate end of radiation treatment.   Provided verbal/written post-RT guidance:  Importance of keeping all follow-up appts, especially those with Nutrition and SLP.  Importance of protecting treatment area from sun.  Continuation of Sonafine application 2-3 times daily, application of antibiotic ointment to areas of raw skin; when supply of Sonafine exhausted transition to OTC lotion with vitamin E. Explained my role as navigator will continue for several more months, encouraged her to call me with needs/concerns.    Harlow Asa RN, BSN, OCN Head & Neck Oncology Nurse [place] [facility] Phone # [phone]  Fax # [phone]

## 2020-05-31 NOTE — Patient Instructions (Signed)

## 2020-05-31 NOTE — Telephone Encounter (Signed)
MD asked for appointments to be scheduled at another facility. I am unable to schedule for another facility so msg was sent to [facility] schedulers to schedule and contact patient.

## 2020-05-31 NOTE — Progress Notes (Signed)
Nutrition follow-up in the infusion room.  Patient has laryngeal cancer and is receiving IV fluids today. Noted lower extremity edema and weeping skin. Weight was documented as 245.2 pounds on January 28 decreased from 257 pounds January 10. Reports using 3 cartons of Osmolite 1.5 via PEG daily. She also is drinking 3 cartons of Ensure plus or equivalent daily.   She is requesting additional samples of Ensure. Labs were reviewed. She denies other nutrition impact symptoms.  Nutrition diagnosis: Inadequate oral intake continues.  Estimated nutrition needs: 2500-2800 cal, 130-145 g protein, 2.5 L fluid  Intervention: Encourage patient to continue strategies for adequate calorie and protein intake.  Provided 1 additional case of Ensure plus. Contacted [facility] who confirms patient does have active enteral orders on file.  They attempted to contact patient to see if she needed a refill however she has not returned their call.  They will go ahead and send a refill for Osmolite 1.5 at my request. Osmolite 1.5 and Ensure Plus providing 2115 cal, 83.7 g protein. Remainder calories and protein to come from oral soft diet.  Monitoring, evaluation, goals: Patient will tolerate increased calories and protein to minimize loss of lean body mass.  Next visit: Patient has my contact information.  We will follow plan of care.  **Disclaimer: This note was dictated with voice recognition software. Similar sounding words can inadvertently be transcribed and this note may contain transcription errors which may not have been corrected upon publication of note.**

## 2020-06-11 ENCOUNTER — Other Ambulatory Visit: Payer: Self-pay

## 2020-06-11 ENCOUNTER — Encounter: Payer: Self-pay | Admitting: Radiation Oncology

## 2020-06-11 ENCOUNTER — Inpatient Hospital Stay (HOSPITAL_COMMUNITY): Payer: Medicaid Other

## 2020-06-11 ENCOUNTER — Inpatient Hospital Stay (HOSPITAL_COMMUNITY): Payer: Medicaid Other | Attending: Hematology | Admitting: Hematology and Oncology

## 2020-06-11 ENCOUNTER — Inpatient Hospital Stay: Payer: Self-pay | Attending: Medical

## 2020-06-11 ENCOUNTER — Encounter (HOSPITAL_COMMUNITY): Payer: Self-pay | Admitting: Hematology and Oncology

## 2020-06-11 ENCOUNTER — Ambulatory Visit
Admission: RE | Admit: 2020-06-11 | Discharge: 2020-06-11 | Disposition: A | Discharge status: Home / Self Care | Payer: Medicaid Other | Source: Ambulatory Visit | Attending: Radiation Oncology | Admitting: Radiation Oncology

## 2020-06-11 VITALS — BP 126/61 | HR 73 | Temp 98.8°F | Resp 20 | Ht 65.5 in | Wt 238.0 lb

## 2020-06-11 VITALS — BP 115/60 | HR 67 | Resp 18 | Wt 235.4 lb

## 2020-06-11 DIAGNOSIS — Z880 Allergy status to penicillin: Secondary | ICD-10-CM | POA: Diagnosis not present

## 2020-06-11 DIAGNOSIS — Z7951 Long term (current) use of inhaled steroids: Secondary | ICD-10-CM | POA: Diagnosis not present

## 2020-06-11 DIAGNOSIS — Z79899 Other long term (current) drug therapy: Secondary | ICD-10-CM | POA: Insufficient documentation

## 2020-06-11 DIAGNOSIS — Z9049 Acquired absence of other specified parts of digestive tract: Secondary | ICD-10-CM | POA: Insufficient documentation

## 2020-06-11 DIAGNOSIS — Z7901 Long term (current) use of anticoagulants: Secondary | ICD-10-CM | POA: Diagnosis not present

## 2020-06-11 DIAGNOSIS — Z923 Personal history of irradiation: Secondary | ICD-10-CM | POA: Insufficient documentation

## 2020-06-11 DIAGNOSIS — C321 Malignant neoplasm of supraglottis: Secondary | ICD-10-CM

## 2020-06-11 DIAGNOSIS — Z23 Encounter for immunization: Secondary | ICD-10-CM | POA: Diagnosis not present

## 2020-06-11 DIAGNOSIS — R5383 Other fatigue: Secondary | ICD-10-CM | POA: Insufficient documentation

## 2020-06-11 DIAGNOSIS — N289 Disorder of kidney and ureter, unspecified: Secondary | ICD-10-CM | POA: Diagnosis not present

## 2020-06-11 DIAGNOSIS — C328 Malignant neoplasm of overlapping sites of larynx: Secondary | ICD-10-CM | POA: Insufficient documentation

## 2020-06-11 DIAGNOSIS — R600 Localized edema: Secondary | ICD-10-CM | POA: Insufficient documentation

## 2020-06-11 DIAGNOSIS — Z7952 Long term (current) use of systemic steroids: Secondary | ICD-10-CM | POA: Insufficient documentation

## 2020-06-11 DIAGNOSIS — I82431 Acute embolism and thrombosis of right popliteal vein: Secondary | ICD-10-CM | POA: Insufficient documentation

## 2020-06-11 DIAGNOSIS — Z885 Allergy status to narcotic agent status: Secondary | ICD-10-CM | POA: Insufficient documentation

## 2020-06-11 LAB — COMPREHENSIVE METABOLIC PANEL
ALT: 9 U/L (ref 0–44)
AST: 17 U/L (ref 15–41)
Albumin: 3.1 g/dL — ABNORMAL LOW (ref 3.5–5.0)
Alkaline Phosphatase: 67 U/L (ref 38–126)
Anion gap: 10 (ref 5–15)
BUN: 14 mg/dL (ref 6–20)
CO2: 31 mmol/L (ref 22–32)
Calcium: 9.1 mg/dL (ref 8.9–10.3)
Chloride: 97 mmol/L — ABNORMAL LOW (ref 98–111)
Creatinine, Ser: 1.41 mg/dL — ABNORMAL HIGH (ref 0.44–1.00)
GFR, Estimated: 43 mL/min — ABNORMAL LOW (ref >60)
Glucose, Bld: 95 mg/dL (ref 70–99)
Potassium: 3.1 mmol/L — ABNORMAL LOW (ref 3.5–5.1)
Sodium: 138 mmol/L (ref 135–145)
Total Bilirubin: 1.6 mg/dL — ABNORMAL HIGH (ref 0.3–1.2)
Total Protein: 7.3 g/dL (ref 6.5–8.1)

## 2020-06-11 LAB — CBC WITH DIFFERENTIAL/PLATELET
Abs Immature Granulocytes: 0.05 10*3/uL (ref 0.00–0.07)
Basophils Absolute: 0 10*3/uL (ref 0.0–0.1)
Basophils Relative: 1 %
Eosinophils Absolute: 0 10*3/uL (ref 0.0–0.5)
Eosinophils Relative: 0 %
HCT: 32 % — ABNORMAL LOW (ref 36.0–46.0)
Hemoglobin: 10 g/dL — ABNORMAL LOW (ref 12.0–15.0)
Immature Granulocytes: 1 %
Lymphocytes Relative: 12 %
Lymphs Abs: 0.7 10*3/uL (ref 0.7–4.0)
MCH: 31.7 pg (ref 26.0–34.0)
MCHC: 31.3 g/dL (ref 30.0–36.0)
MCV: 101.6 fL — ABNORMAL HIGH (ref 80.0–100.0)
Monocytes Absolute: 0.6 10*3/uL (ref 0.1–1.0)
Monocytes Relative: 10 %
Neutro Abs: 4.2 10*3/uL (ref 1.7–7.7)
Neutrophils Relative %: 76 %
Platelets: 276 10*3/uL (ref 150–400)
RBC: 3.15 MIL/uL — ABNORMAL LOW (ref 3.87–5.11)
RDW: 17.1 % — ABNORMAL HIGH (ref 11.5–15.5)
WBC: 5.5 10*3/uL (ref 4.0–10.5)
nRBC: 0 % (ref 0.0–0.2)

## 2020-06-11 LAB — MAGNESIUM: Magnesium: 1.9 mg/dL (ref 1.7–2.4)

## 2020-06-11 MED ORDER — GUAIFENESIN-DM 100-10 MG/5ML PO SYRP: 10.0000 mL | ORAL_SOLUTION | ORAL | 0 refills | Status: DC | PRN

## 2020-06-11 NOTE — Progress Notes (Addendum)
Veronica Horton presents today for 2 week follow-up after completing radiation to her larynx on 05/31/2020  Pain issues, if any: patient denies Using a feeding tube?: Yes--reports she does 2 feedings daily (1 carton of Osmolite at each session) in addition to trying to increase in oral intake Weight changes, if any:  Wt Readings from Last 3 Encounters:  06/11/20 238 lb (108 kg)  06/11/20 235 lb 6.4 oz (106.8 kg)  05/28/20 245 lb 3.2 oz (111.2 kg)   Swallowing issues, if any: Patient has increased oral intake, but still only tolerates liquids or very soft/pureed foods. Denies any issues or struggles when she does consume something by mouth. Reports throat discomfort has greatly improved Smoking or chewing tobacco? Patient denies Using fluoride trays daily? N/A Last ENT visit was on: Not since diagnosis Other notable issues, if any: Patient met with medical oncologist (Dr. Alphonzo Severance Iruku) this morning. Continues to deal with fatigue, but states it's improving. Reports improvement in dry mouth and thick saliva. Denies any ear or jaw pain, or difficulty opening her mouth. Denies any issues with lymphedema to the neck/jaw. She reports she is scheduled to see her PCP next week to continue management of lower extremity edema. Legs appear less red/swollen, and skin is no longer weeping  Vitals:   06/11/20 1052  BP: 126/61  Pulse: 73  Resp: 20  Temp: 98.8 F (37.1 C)  SpO2: 98%

## 2020-06-11 NOTE — Progress Notes (Signed)
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[place])  03/18/2020 Initial Diagnosis   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[place])   03/22/2020 Cancer Staging   Staging form: Larynx - Supraglottis, AJCC 7th Edition - Clinical: Stage IVA (T4a, N2c, M0) - Signed by Eppie Gibson, MD on 03/22/2020   03/23/2020 - 03/23/2020 Chemotherapy   The patient had dexamethasone (DECADRON) 4 MG tablet, 8 mg, Oral, Daily, 0 of 1 cycle, Start date: --, End date: -- palonosetron (ALOXI) injection 0.25 mg, 0.25 mg, Intravenous,  Once, 0 of 10 cycles CISplatin (PLATINOL) 182 mg in sodium chloride 0.9 % 500 mL chemo infusion, 80 mg/m2, Intravenous,  Once, 0 of 10 cycles gemcitabine (GEMZAR) 2,280 mg in sodium chloride 0.9 % 250 mL chemo infusion, 1,000 mg/m2, Intravenous,  Once, 0 of 3 cycles fosaprepitant (EMEND) 150 mg in sodium chloride 0.9 % 145 mL IVPB, 150 mg, Intravenous,  Once, 0 of 10 cycles  for chemotherapy treatment.    03/29/2020 - 05/12/2020 Chemotherapy           CURRENT THERAPY: ChemoRT with Cisplatin  Completed radiation on 05/31/2020 She received 4 weekly cycles of cisplatin, several interruptions due to infections, non compliance etc.  INTERVAL HISTORY:   She is here for FU, multiple no shows and cancellations in the past. She is doing good today. Ongoing cough but no change from baseline. No fevers or worsening SOB, oxygen needs are stable. Taking eliquis for DVT, no bleeding issues Leg swelling is better, no CP. Neck lymph nodes have resolved On oxygen 5 L which is her baseline. No diarrhea, dysuria.  No hearing issues, neuropathy. She feels well over all. Rest of the pertinent 10  point ROS reviewed and negative  MEDICAL HISTORY:  Past Medical History:  Diagnosis Date  . Bronchitis   . Class 3 obesity 01/23/2020  . COPD (chronic obstructive pulmonary disease) ([place])   . Degenerative disc disease, lumbar   . Hiatal hernia   . Hypertension     SURGICAL HISTORY: Past Surgical History:  Procedure Laterality Date  . BIOPSY  04/12/2020   Procedure: BIOPSY;  Surgeon: Wilford Corner, MD;  Location: [facility];  Service: Gastroenterology;;  . CHOLECYSTECTOMY    . degenerative bone disease    . DIRECT LARYNGOSCOPY N/A 03/13/2020   Procedure: DIRECT LARYNGOSCOPY WITH BIOPSY;  Surgeon: Jason Coop, DO;  Location: [facility];  Service: ENT;  Laterality: N/A;  . FLEXIBLE SIGMOIDOSCOPY N/A 04/12/2020   Procedure: FLEXIBLE SIGMOIDOSCOPY;  Surgeon: Wilford Corner, MD;  Location: [facility];  Service: Gastroenterology;  Laterality: N/A;  . INCISIONAL HERNIA REPAIR N/A 01/15/2019   Procedure: [name] HERNIORRHAPHY WITH MESH;  Surgeon: [name], MD;  Location: [facility];  Service: General;  Laterality: N/A;  . IR GASTROSTOMY TUBE MOD SED  03/22/2020  . IR RADIOLOGIST EVAL & MGMT  05/07/2020  . OMENTECTOMY N/A 01/15/2019   Procedure: OMENTECTOMY;  Surgeon: [name], MD;  Location: [facility];  Service: General;  Laterality: N/A;  . POLYPECTOMY  04/12/2020   Procedure: POLYPECTOMY;  Surgeon: Wilford Corner, MD;  Location: [facility];  Service: Gastroenterology;;  . TRACHEOSTOMY TUBE PLACEMENT N/A 03/13/2020   Procedure: AWAKE TRACHEOSTOMY;  Surgeon: Jason Coop, DO;  Location:  [facility];  Service: ENT;  Laterality: N/A;    I have reviewed the social history and family history with the patient and they are unchanged from previous note.  ALLERGIES:  is allergic to codeine and penicillins.  MEDICATIONS:  Current Outpatient Medications  Medication Sig Dispense Refill  . Albuterol Sulfate (PROAIR RESPICLICK) 188 (90 Base) MCG/ACT AEPB See admin  instructions.    . APIXABAN (ELIQUIS) VTE STARTER PACK (10MG  AND 5MG ) Take as directed on package: start with two-5mg  tablets twice daily for 7 days. On day 8, switch to one-5mg  tablet twice daily. 1 each 0  . arformoterol (BROVANA) 15 MCG/2ML NEBU Take 2 mLs (15 mcg total) by nebulization 2 (two) times daily. 120 mL 2  . bisacodyl (DULCOLAX) 10 MG suppository Place 1 suppository (10 mg total) rectally daily as needed for severe constipation. 12 suppository 0  . budesonide (PULMICORT) 0.5 MG/2ML nebulizer solution Take 2 mLs (0.5 mg total) by nebulization 2 (two) times daily. 120 mL 2  . dexamethasone (DECADRON) 2 MG tablet Take 2 tablets (4 mg total) by mouth 2 (two) times daily. 30 tablet 0  . diclofenac Sodium (VOLTAREN) 1 % GEL Apply 2 g topically 4 (four) times daily as needed (neck/back pain). 350 g 0  . diphenhydrAMINE (BENADRYL) 12.5 MG/5ML elixir Place 5 mLs (12.5 mg total) into feeding tube every 6 (six) hours as needed for itching. 120 mL 0  . diphenoxylate-atropine (LOMOTIL) 2.5-0.025 MG tablet Take 1 tablet by mouth 4 (four) times daily as needed for diarrhea or loose stools. 30 tablet 1  . escitalopram (LEXAPRO) 5 MG tablet Take 1 tablet (5 mg total) by mouth daily. 30 tablet 2  . feeding supplement, ENSURE ENLIVE, (ENSURE ENLIVE) LIQD Take 237 mLs by mouth 2 (two) times daily between meals. 416 mL 12  . folic acid (FOLVITE) 1 MG tablet Take 1 tablet (1 mg total) by mouth daily. 30 tablet 2  . furosemide (LASIX) 40 MG tablet Take 40 mg by mouth daily.    Marland Kitchen gabapentin (NEURONTIN) 300 MG capsule Take 2 capsules (600 mg total) by mouth at bedtime.    . hydrOXYzine (ATARAX/VISTARIL) 10 MG tablet Place 1 tablet (10 mg total) into feeding tube 3 (three) times daily as needed for anxiety. 30 tablet 2  . ketotifen (ZADITOR) 0.025 % ophthalmic solution Place 1 drop into both eyes 2 (two) times daily. 5 mL 0  . lidocaine (LIDODERM) 5 % Place 1 patch onto the skin daily. Remove & Discard patch  within 12 hours or as directed by MD 30 patch 0  . melatonin 5 MG TABS Take 1 tablet (5 mg total) by mouth at bedtime. 30 tablet 2  . midodrine (PROAMATINE) 5 MG tablet Take 1 tablet (5 mg total) by mouth 2 (two) times daily with a meal. 60 tablet 2  . oxyCODONE (OXY IR/ROXICODONE) 5 MG immediate release tablet Place 1 tablet (5 mg total) into feeding tube every 6 (six) hours as needed for severe pain. 15 tablet 0  . polyethylene glycol (MIRALAX / GLYCOLAX) 17 g packet Place 17 g into feeding tube 2 (two) times daily as needed for moderate constipation. 14 each 0  . saccharomyces boulardii (FLORASTOR) 250 MG capsule Take 1 capsule (250 mg total) by mouth 2 (two) times daily. 60 capsule 0  . scopolamine (TRANSDERM-SCOP) 1 MG/3DAYS Place 1 patch (1.5 mg total) onto the skin every 3 (three) days. 10 patch 12  . vitamin B-12 (CYANOCOBALAMIN) 500 MCG tablet Take 1  tablet (500 mcg total) by mouth daily. 30 tablet 3  . guaiFENesin-dextromethorphan (ROBITUSSIN DM) 100-10 MG/5ML syrup Take 10 mLs by mouth every 4 (four) hours as needed for cough. 118 mL 0  . ipratropium-albuterol (DUONEB) 0.5-2.5 (3) MG/3ML SOLN Take 3 mLs by nebulization every 6 (six) hours as needed. 360 mL 2  . pantoprazole sodium (PROTONIX) 40 mg/20 mL PACK Place 20 mLs (40 mg total) into feeding tube daily. 600 mL 2   No current facility-administered medications for this visit.    PHYSICAL EXAMINATION: ECOG PERFORMANCE STATUS: 2 - Symptomatic, <50% confined to bed  Vitals:   06/11/20 0835  BP: 115/60  Pulse: 67  Resp: 18  SpO2: 94%   Filed Weights   06/11/20 0835  Weight: 235 lb 6.4 oz (106.8 kg)    GENERAL:alert, no distress and comfortable SKIN: no rash  EYES: sclera clear Neck: Lymphadenopathy resolved, LUNGS: clear with normal breathing effort. Trach in place. HEART: regular rate & rhythm.  Extremities: Marked pitting edema improving.  Chronic venous stasis changes. ABDOMEN:abdomen soft, non-tender and normal  bowel sounds. Feeding tube in place/capped, no drainage NEURO: alert & oriented x 3 with fluent speech   LABORATORY DATA:  I have reviewed the data as listed CBC Latest Ref Rng & Units 05/28/2020 05/10/2020 05/07/2020  WBC 4.0 - 10.5 K/uL 4.2 4.7 4.1  Hemoglobin 12.0 - 15.0 g/dL 9.4(L) 9.8(L) 9.7(L)  Hematocrit 36.0 - 46.0 % 29.2(L) 29.5(L) 30.1(L)  Platelets 150 - 400 K/uL 157 179 186     CMP Latest Ref Rng & Units 05/28/2020 05/10/2020 05/07/2020  Glucose 70 - 99 mg/dL 86 97 87  BUN 6 - 20 mg/dL 22(H) 22(H) 23(H)  Creatinine 0.44 - 1.00 mg/dL 1.58(H) 1.39(H) 1.73(H)  Sodium 135 - 145 mmol/L 140 138 142  Potassium 3.5 - 5.1 mmol/L 4.0 3.8 4.3  Chloride 98 - 111 mmol/L 99 102 103  CO2 22 - 32 mmol/L 32 27 28  Calcium 8.9 - 10.3 mg/dL 9.2 8.9 9.3  Total Protein 6.5 - 8.1 g/dL 6.6 6.9 6.9  Total Bilirubin 0.3 - 1.2 mg/dL 1.2 0.5 0.6  Alkaline Phos 38 - 126 U/L 65 48 45  AST 15 - 41 U/L 11(L) 9(L) 11(L)  ALT 0 - 44 U/L 7 9 11     RADIOGRAPHIC STUDIES: I have personally reviewed the radiological images as listed and agreed with the findings in the report. No results found.   ASSESSMENT & PLAN: 60 year old female  1.  Squamous cell carcinoma of the supraglottis, [id number], stage IVA -Began concurrent chemoradiation with cisplatin on 03/29/2020, -She has had chemo interruption for pneumonia and renal dysfunction and also partly driven by patient, rescheduled a few times , cancels and no shows several times. Unfortunately her GFR continues to be low, hence we couldn't proceed with C5. She completed 4 cycles of cisplatin weekly, radiation completed on 05/31/2020 PE, complete response in the neck. Awaiting restaging PET.  2.  Respiratory On 5 L oxygen which is her baseline requirement.  3.  Leg edema,  DVT noted in the right popliteal vein Started on eliquis. She was warned of risk of bleeding with eliquis. This has improved since her last visit.  4.  Renal dysfunction GFR in 30's,  creatinine of 1.58, likely because of her other co-morbidities, she has some baseline creatinine impairment. Repeat labs today  Refilled her guaifenesin/dextromethorphan syrup RTC in 2 weeks, pt wants to come to [place], so will RTC in 3 weeks.  Benay Pike, MD 06/11/20

## 2020-06-11 NOTE — Progress Notes (Signed)
   Covid-19 Vaccination Clinic  Name:  Veronica Horton    MRN: [id number] DOB: 07-05-1960  06/11/2020  [name] was observed post Covid-19 immunization for 15 minutes without incident. She was provided with Vaccine Information Sheet and instruction to access the V-Safe system.   [name] was instructed to call 911 with any severe reactions post vaccine: Marland Kitchen Difficulty breathing  . Swelling of face and throat  . A fast heartbeat  . A bad rash all over body  . Dizziness and weakness   Immunizations Administered    Name Date Dose VIS Date Route   PFIZER Comrnaty(Gray TOP) Covid-19 Vaccine 06/11/2020 11:08 AM 0.3 mL 04/08/2020 Intramuscular   Manufacturer: [employer]   Lot: [id number]   NDC: [phone]

## 2020-06-11 NOTE — Progress Notes (Signed)
Radiation Oncology         [phone] ________________________________  Name: Veronica Horton MRN: [id number]  Date: 06/11/2020  DOB: [birth year]  Follow-Up Visit Note  CC: [facility]  Diagnosis and Prior Radiotherapy:       ICD-10-CM   1. 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   Veronica Horton presents today for 2 week follow-up after completing radiation to her larynx on 05/31/2020  Pain issues, if any: patient denies Using a feeding tube?: Yes--reports she does 2 feedings daily (1 carton of Osmolite at each session) in addition to trying to increase in oral intake Weight changes, if any:  Wt Readings from Last 3 Encounters:  06/11/20 238 lb (108 kg)  06/11/20 235 lb 6.4 oz (106.8 kg)  05/28/20 245 lb 3.2 oz (111.2 kg)   Swallowing issues, if any: Patient has increased oral intake, but still only tolerates liquids or very soft/pureed foods. Denies any issues or struggles when she does consume something by mouth. Reports throat discomfort has greatly improved Smoking or chewing tobacco? Patient denies Using fluoride trays daily? N/A Last ENT visit was on: Not since diagnosis Other notable issues, if any: Patient met with medical oncologist (Dr. Alphonzo Severance Iruku) this morning. Continues to deal with fatigue, but states it's improving. Reports improvement in dry mouth and thick saliva. Denies any ear or jaw pain, or difficulty opening her mouth. Denies any issues with lymphedema to the neck/jaw. She reports she is scheduled to see her PCP next week to continue management of lower  extremity edema. Legs appear less red/swollen, and skin is no longer weeping  Vitals:   06/11/20 1052  BP: 126/61  Pulse: 73  Resp: 20  Temp: 98.8 F (37.1 C)  SpO2: 98%                       ALLERGIES:  is allergic to codeine and penicillins.  Meds: Current Outpatient Medications  Medication Sig Dispense Refill  . Albuterol Sulfate (PROAIR RESPICLICK) 678 (90 Base) MCG/ACT AEPB See admin instructions.    . APIXABAN (ELIQUIS) VTE STARTER PACK ($RemoveBefor'10MG'CGaiPTyKncIg$  AND $Re'5MG'DlK$ ) Take as directed on package: start with two-$RemoveBefore'5mg'dNqZqwsNDAWzN$  tablets twice daily for 7 days. On day 8, switch to one-$RemoveBefor'5mg'pCccoAWFEyce$  tablet twice daily. 1 each 0  . arformoterol (BROVANA) 15 MCG/2ML NEBU Take 2 mLs (15 mcg total) by nebulization 2 (two) times daily. 120 mL 2  . bisacodyl (DULCOLAX) 10 MG suppository Place 1 suppository (10 mg total) rectally daily as needed for severe constipation. 12 suppository 0  . budesonide (PULMICORT) 0.5 MG/2ML nebulizer solution Take 2 mLs (0.5 mg total) by nebulization 2 (two) times daily. 120 mL 2  . dexamethasone (DECADRON) 2 MG tablet Take 2 tablets (4 mg total) by mouth 2 (two) times daily. 30 tablet 0  . diclofenac Sodium (VOLTAREN) 1 % GEL Apply 2 g topically 4 (four) times daily as needed (neck/back pain). 350 g 0  . diphenhydrAMINE (BENADRYL) 12.5 MG/5ML elixir Place 5 mLs (12.5 mg total) into feeding tube every 6 (six) hours as needed for itching. [place]  mL 0  . diphenoxylate-atropine (LOMOTIL) 2.5-0.025 MG tablet Take 1 tablet by mouth 4 (four) times daily as needed for diarrhea or loose stools. 30 tablet 1  . escitalopram (LEXAPRO) 5 MG tablet Take 1 tablet (5 mg total) by mouth daily. 30 tablet 2  . feeding supplement, ENSURE ENLIVE, (ENSURE ENLIVE) LIQD Take 237 mLs by mouth 2 (two) times daily between meals. 644 mL 12  . folic acid (FOLVITE) 1 MG tablet Take 1 tablet (1 mg total) by mouth daily. 30 tablet 2  . furosemide (LASIX) 40 MG tablet Take 40 mg by mouth daily.    Marland Kitchen gabapentin (NEURONTIN) 300 MG  capsule Take 2 capsules (600 mg total) by mouth at bedtime.    Marland Kitchen guaiFENesin-dextromethorphan (ROBITUSSIN DM) 100-10 MG/5ML syrup Take 10 mLs by mouth every 4 (four) hours as needed for cough. 118 mL 0  . hydrOXYzine (ATARAX/VISTARIL) 10 MG tablet Place 1 tablet (10 mg total) into feeding tube 3 (three) times daily as needed for anxiety. 30 tablet 2  . ipratropium-albuterol (DUONEB) 0.5-2.5 (3) MG/3ML SOLN Take 3 mLs by nebulization every 6 (six) hours as needed. 360 mL 2  . ketotifen (ZADITOR) 0.025 % ophthalmic solution Place 1 drop into both eyes 2 (two) times daily. 5 mL 0  . lidocaine (LIDODERM) 5 % Place 1 patch onto the skin daily. Remove & Discard patch within 12 hours or as directed by MD 30 patch 0  . melatonin 5 MG TABS Take 1 tablet (5 mg total) by mouth at bedtime. 30 tablet 2  . midodrine (PROAMATINE) 5 MG tablet Take 1 tablet (5 mg total) by mouth 2 (two) times daily with a meal. 60 tablet 2  . oxyCODONE (OXY IR/ROXICODONE) 5 MG immediate release tablet Place 1 tablet (5 mg total) into feeding tube every 6 (six) hours as needed for severe pain. 15 tablet 0  . pantoprazole sodium (PROTONIX) 40 mg/20 mL PACK Place 20 mLs (40 mg total) into feeding tube daily. 600 mL 2  . polyethylene glycol (MIRALAX / GLYCOLAX) 17 g packet Place 17 g into feeding tube 2 (two) times daily as needed for moderate constipation. 14 each 0  . saccharomyces boulardii (FLORASTOR) 250 MG capsule Take 1 capsule (250 mg total) by mouth 2 (two) times daily. 60 capsule 0  . scopolamine (TRANSDERM-SCOP) 1 MG/3DAYS Place 1 patch (1.5 mg total) onto the skin every 3 (three) days. 10 patch 12  . vitamin B-12 (CYANOCOBALAMIN) 500 MCG tablet Take 1 tablet (500 mcg total) by mouth daily. 30 tablet 3   No current facility-administered medications for this encounter.    Physical Findings: The patient is in no acute distress. Patient is alert and oriented. Wt Readings from Last 3 Encounters:  06/11/20 238 lb (108 kg)   06/11/20 235 lb 6.4 oz (106.8 kg)  05/28/20 245 lb 3.2 oz (111.2 kg)    height is 5' 5.5" (1.664 m) and weight is 238 lb (108 kg). Her temperature is 98.8 F (37.1 C). Her blood pressure is 126/61 and her pulse is 73. Her respiration is 20 and oxygen saturation is 98%. .  General: Alert and oriented, in no acute distress, in a wheelchair HEENT:  Oropharynx is notable for no thrush or lesions in upper throat.  Oral cavity is clear. Neck: Neck is notable for good healing of skin thus far, most of the desquamation and erythema have resolved.  There is a little bit of ooze around the tracheostomy site Skin: Skin in treatment  fields shows satisfactory healing  Psychiatric: Judgment and insight are intact. Affect is appropriate.   Lab Findings: Lab Results  Component Value Date   WBC 5.5 06/11/2020   HGB 10.0 (L) 06/11/2020   HCT 32.0 (L) 06/11/2020   MCV 101.6 (H) 06/11/2020   PLT 276 06/11/2020    Lab Results  Component Value Date   TSH 2.255 04/24/2020    Radiographic Findings: US Venous Img Lower Unilateral Right (DVT)  Result Date: 05/25/2020 CLINICAL DATA:  60 year old female with right lower extremity pain and edema. EXAM: Right LOWER EXTREMITY VENOUS DOPPLER ULTRASOUND TECHNIQUE: Gray-scale sonography with compression, as well as color and duplex ultrasound, were performed to evaluate the deep venous system(s) from the level of the common femoral vein through the popliteal and proximal calf veins. COMPARISON:  None. FINDINGS: VENOUS There is occlusive thrombus in the right popliteal vein with noncompressibility of the vessel. Normal compressibility of the common femoral, and superficial femoral veins. The calf veins are not visualized. Limited views of the contralateral common femoral vein are unremarkable. OTHER None. Limitations: none IMPRESSION: Occlusive thrombus in the right popliteal vein. These results were called by telephone at the time of interpretation on 05/25/2020 at  6:01 pm to provider Ruddick, who verbally acknowledged these results. Electronically Signed   By: Anner Crete M.D.   On: 05/25/2020 18:24    Impression/Plan:    1) Head and Neck Cancer Status: Healing well from chemoradiation  2) Nutritional Status: Still using PEG tube and eating soft foods as well.  Reports good intake of clear fluids PEG tube: We will continue to use for supplementation  3) Risk Factors: The patient has been educated about risk factors including alcohol and tobacco abuse; they understand that avoidance of alcohol and tobacco is important to prevent recurrences as well as other cancers  4) Swallowing: Continue speech-language pathology exercises  5) Dental: Encouraged to continue regular followup with dentistry, and dental hygiene including fluoride rinses.   6) Thyroid function: Check annually Lab Results  Component Value Date   TSH 2.255 04/24/2020    7) Other: Patient has an appointment with her primary doctor in the near future due to significant lower extremity edema and skin breakdown  COVID-19 pandemic risk mitigation: She received her booster shot this morning.  8) Follow-up in early May with restaging CT of the neck and chest.  She will continue to follow closely with medical oncology.   The patient was encouraged to call with any issues or questions before then.  On date of service, in total, I spent 25 minutes on this encounter. Patient was seen in person. _____________________________________   Eppie Gibson, MD

## 2020-06-16 ENCOUNTER — Other Ambulatory Visit: Payer: Self-pay

## 2020-06-16 ENCOUNTER — Telehealth: Payer: Self-pay

## 2020-06-16 DIAGNOSIS — C321 Malignant neoplasm of supraglottis: Secondary | ICD-10-CM

## 2020-06-16 MED ORDER — POTASSIUM CHLORIDE CRYS ER 20 MEQ PO TBCR: 20.0000 meq | EXTENDED_RELEASE_TABLET | Freq: Every day | ORAL | 3 refills | Status: DC

## 2020-06-16 NOTE — Progress Notes (Signed)
Per Dr. Chryl Heck, I am sending in a prescription for potassium 20 meg once daily. Patient was made aware.

## 2020-06-16 NOTE — Telephone Encounter (Signed)
I spoke with the patient's sister and patient to go over Dr. Rob Hickman recommendations. Patient denies taking any potassium. We went over potassium rich foods and to incorporate more of those into her diet daily. Patient is aware that a prescription for potassium has been sent to their preferred pharmacy.

## 2020-06-16 NOTE — Telephone Encounter (Signed)
-----   Message from Benay Pike, MD sent at 06/16/2020  7:41 AM EST ----- Veronica Horton  Please see if she is taking any potassium supplements. If not, lets add 20 meq daily since she is using diuretics, ok to send prescription Kcl 20 meq daily Also encourage potassium rich foods.  Thanks,

## 2020-06-30 ENCOUNTER — Other Ambulatory Visit: Payer: Self-pay | Admitting: Oncology

## 2020-07-02 ENCOUNTER — Other Ambulatory Visit: Payer: Self-pay

## 2020-07-02 ENCOUNTER — Inpatient Hospital Stay (HOSPITAL_COMMUNITY): Payer: Medicaid Other

## 2020-07-02 ENCOUNTER — Encounter (HOSPITAL_COMMUNITY): Payer: Self-pay | Admitting: Hematology and Oncology

## 2020-07-02 ENCOUNTER — Inpatient Hospital Stay (HOSPITAL_COMMUNITY): Payer: Medicaid Other | Attending: Hematology | Admitting: Hematology and Oncology

## 2020-07-02 VITALS — BP 121/73 | HR 63 | Temp 96.8°F | Resp 20 | Wt 229.3 lb

## 2020-07-02 DIAGNOSIS — C321 Malignant neoplasm of supraglottis: Secondary | ICD-10-CM | POA: Insufficient documentation

## 2020-07-02 DIAGNOSIS — Z79899 Other long term (current) drug therapy: Secondary | ICD-10-CM | POA: Diagnosis not present

## 2020-07-02 DIAGNOSIS — R634 Abnormal weight loss: Secondary | ICD-10-CM | POA: Insufficient documentation

## 2020-07-02 DIAGNOSIS — Z7901 Long term (current) use of anticoagulants: Secondary | ICD-10-CM | POA: Insufficient documentation

## 2020-07-02 DIAGNOSIS — N289 Disorder of kidney and ureter, unspecified: Secondary | ICD-10-CM | POA: Insufficient documentation

## 2020-07-02 DIAGNOSIS — I82431 Acute embolism and thrombosis of right popliteal vein: Secondary | ICD-10-CM | POA: Diagnosis not present

## 2020-07-02 DIAGNOSIS — Z885 Allergy status to narcotic agent status: Secondary | ICD-10-CM | POA: Insufficient documentation

## 2020-07-02 DIAGNOSIS — R609 Edema, unspecified: Secondary | ICD-10-CM | POA: Insufficient documentation

## 2020-07-02 DIAGNOSIS — Z923 Personal history of irradiation: Secondary | ICD-10-CM | POA: Insufficient documentation

## 2020-07-02 DIAGNOSIS — Z880 Allergy status to penicillin: Secondary | ICD-10-CM | POA: Insufficient documentation

## 2020-07-02 DIAGNOSIS — Z9049 Acquired absence of other specified parts of digestive tract: Secondary | ICD-10-CM | POA: Insufficient documentation

## 2020-07-02 LAB — COMPREHENSIVE METABOLIC PANEL
ALT: 10 U/L (ref 0–44)
AST: 14 U/L — ABNORMAL LOW (ref 15–41)
Albumin: 3.3 g/dL — ABNORMAL LOW (ref 3.5–5.0)
Alkaline Phosphatase: 67 U/L (ref 38–126)
Anion gap: 11 (ref 5–15)
BUN: 21 mg/dL — ABNORMAL HIGH (ref 6–20)
CO2: 27 mmol/L (ref 22–32)
Calcium: 9.2 mg/dL (ref 8.9–10.3)
Chloride: 95 mmol/L — ABNORMAL LOW (ref 98–111)
Creatinine, Ser: 1.39 mg/dL — ABNORMAL HIGH (ref 0.44–1.00)
GFR, Estimated: 43 mL/min — ABNORMAL LOW (ref >60)
Glucose, Bld: 85 mg/dL (ref 70–99)
Potassium: 3.9 mmol/L (ref 3.5–5.1)
Sodium: 133 mmol/L — ABNORMAL LOW (ref 135–145)
Total Bilirubin: 0.8 mg/dL (ref 0.3–1.2)
Total Protein: 7.8 g/dL (ref 6.5–8.1)

## 2020-07-02 LAB — CBC WITH DIFFERENTIAL/PLATELET
Abs Immature Granulocytes: 0.07 10*3/uL (ref 0.00–0.07)
Basophils Absolute: 0.1 10*3/uL (ref 0.0–0.1)
Basophils Relative: 1 %
Eosinophils Absolute: 0.1 10*3/uL (ref 0.0–0.5)
Eosinophils Relative: 1 %
HCT: 32.8 % — ABNORMAL LOW (ref 36.0–46.0)
Hemoglobin: 10.2 g/dL — ABNORMAL LOW (ref 12.0–15.0)
Immature Granulocytes: 1 %
Lymphocytes Relative: 14 %
Lymphs Abs: 1 10*3/uL (ref 0.7–4.0)
MCH: 31.5 pg (ref 26.0–34.0)
MCHC: 31.1 g/dL (ref 30.0–36.0)
MCV: 101.2 fL — ABNORMAL HIGH (ref 80.0–100.0)
Monocytes Absolute: 0.6 10*3/uL (ref 0.1–1.0)
Monocytes Relative: 8 %
Neutro Abs: 5.2 10*3/uL (ref 1.7–7.7)
Neutrophils Relative %: 75 %
Platelets: 291 10*3/uL (ref 150–400)
RBC: 3.24 MIL/uL — ABNORMAL LOW (ref 3.87–5.11)
RDW: 15.3 % (ref 11.5–15.5)
WBC: 6.9 10*3/uL (ref 4.0–10.5)
nRBC: 0 % (ref 0.0–0.2)

## 2020-07-02 LAB — MAGNESIUM: Magnesium: 2.1 mg/dL (ref 1.7–2.4)

## 2020-07-02 MED ORDER — APIXABAN 5 MG PO TABS: 5.0000 mg | ORAL_TABLET | Freq: Two times a day (BID) | ORAL | 1 refills | Status: DC

## 2020-07-02 NOTE — Progress Notes (Signed)
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[place])   03/22/2020 Cancer Staging   Staging form: Larynx - Supraglottis, AJCC 7th Edition - Clinical: Stage IVA (T4a, N2c, M0) - Signed by Eppie Gibson, MD on 03/22/2020   03/23/2020 - 03/23/2020 Chemotherapy   The patient had dexamethasone (DECADRON) 4 MG tablet, 8 mg, Oral, Daily, 0 of 1 cycle, Start date: --, End date: -- palonosetron (ALOXI) injection 0.25 mg, 0.25 mg, Intravenous,  Once, 0 of 10 cycles CISplatin (PLATINOL) 182 mg in sodium chloride 0.9 % 500 mL chemo infusion, 80 mg/m2, Intravenous,  Once, 0 of 10 cycles gemcitabine (GEMZAR) 2,280 mg in sodium chloride 0.9 % 250 mL chemo infusion, 1,000 mg/m2, Intravenous,  Once, 0 of 3 cycles fosaprepitant (EMEND) 150 mg in sodium chloride 0.9 % 145 mL IVPB, 150 mg, Intravenous,  Once, 0 of 10 cycles  for chemotherapy treatment.    03/29/2020 - 05/12/2020 Chemotherapy           CURRENT THERAPY:   ChemoRT with Cisplatin  Completed radiation on 05/31/2020 She received 4 weekly cycles of cisplatin, several interruptions due to infections, non compliance etc.  INTERVAL HISTORY:   She is here for follow up after chemotherapy No fevers or worsening SOB, oxygen needs are stable. She stopped taking eliquis 2 days ago, she ran out of this Leg swelling is better, no CP. Neck lymph nodes have resolved On oxygen 5 L which is her baseline. No diarrhea, dysuria.  No hearing issues, neuropathy. She is losing weight, using G tube twice a day , eating couple times a day. She met with our nutritionist today. Rest of the  pertinent 10 point ROS reviewed and negative  MEDICAL HISTORY:  Past Medical History:  Diagnosis Date  . Bronchitis   . Class 3 obesity 01/23/2020  . COPD (chronic obstructive pulmonary disease) ([place])   . Degenerative disc disease, lumbar   . Hiatal hernia   . Hypertension     SURGICAL HISTORY: Past Surgical History:  Procedure Laterality Date  . BIOPSY  04/12/2020   Procedure: BIOPSY;  Surgeon: Wilford Corner, MD;  Location: [facility];  Service: Gastroenterology;;  . CHOLECYSTECTOMY    . degenerative bone disease    . DIRECT LARYNGOSCOPY N/A 03/13/2020   Procedure: DIRECT LARYNGOSCOPY WITH BIOPSY;  Surgeon: Jason Coop, DO;  Location: [facility];  Service: ENT;  Laterality: N/A;  . FLEXIBLE SIGMOIDOSCOPY N/A 04/12/2020   Procedure: FLEXIBLE SIGMOIDOSCOPY;  Surgeon: Wilford Corner, MD;  Location: [facility];  Service: Gastroenterology;  Laterality: N/A;  . INCISIONAL HERNIA REPAIR N/A 01/15/2019   Procedure: [name] HERNIORRHAPHY WITH MESH;  Surgeon: [name], MD;  Location: [facility];  Service: General;  Laterality: N/A;  . IR GASTROSTOMY TUBE MOD SED  03/22/2020  . IR RADIOLOGIST EVAL & MGMT  05/07/2020  . OMENTECTOMY N/A 01/15/2019   Procedure: OMENTECTOMY;  Surgeon: [name], MD;  Location: [facility];  Service: General;  Laterality: N/A;  . POLYPECTOMY  04/12/2020   Procedure: POLYPECTOMY;  Surgeon: Wilford Corner, MD;  Location: [facility];  Service: Gastroenterology;;  . TRACHEOSTOMY TUBE PLACEMENT N/A 03/13/2020   Procedure: AWAKE TRACHEOSTOMY;  Surgeon: Jason Coop, DO;  Location: [facility];  Service: ENT;  Laterality: N/A;    I have reviewed the social history and family history with the patient and they are unchanged from previous note.  ALLERGIES:  is allergic to codeine and penicillins.  MEDICATIONS:  Current Outpatient Medications  Medication Sig Dispense Refill  . Albuterol Sulfate (PROAIR RESPICLICK) 921 (90 Base) MCG/ACT AEPB See  admin instructions.    . APIXABAN (ELIQUIS) VTE STARTER PACK (10MG AND 5MG) Take as directed on package: start with two-36m tablets twice daily for 7 days. On day 8, switch to one-51mtablet twice daily. 1 each 0  . arformoterol (BROVANA) 15 MCG/2ML NEBU Take 2 mLs (15 mcg total) by nebulization 2 (two) times daily. 120 mL 2  . bisacodyl (DULCOLAX) 10 MG suppository Place 1 suppository (10 mg total) rectally daily as needed for severe constipation. 12 suppository 0  . budesonide (PULMICORT) 0.5 MG/2ML nebulizer solution Take 2 mLs (0.5 mg total) by nebulization 2 (two) times daily. 120 mL 2  . dexamethasone (DECADRON) 2 MG tablet Take 2 tablets (4 mg total) by mouth 2 (two) times daily. 30 tablet 0  . diclofenac Sodium (VOLTAREN) 1 % GEL Apply 2 g topically 4 (four) times daily as needed (neck/back pain). 350 g 0  . diphenhydrAMINE (BENADRYL) 12.5 MG/5ML elixir Place 5 mLs (12.5 mg total) into feeding tube every 6 (six) hours as needed for itching. 120 mL 0  . diphenoxylate-atropine (LOMOTIL) 2.5-0.025 MG tablet Take 1 tablet by mouth 4 (four) times daily as needed for diarrhea or loose stools. 30 tablet 1  . escitalopram (LEXAPRO) 5 MG tablet Take 1 tablet (5 mg total) by mouth daily. 30 tablet 2  . feeding supplement, ENSURE ENLIVE, (ENSURE ENLIVE) LIQD Take 237 mLs by mouth 2 (two) times daily between meals. [id number] 12  . folic acid (FOLVITE) 1 MG tablet Take 1 tablet (1 mg total) by mouth daily. 30 tablet 2  . furosemide (LASIX) 40 MG tablet Take 40 mg by mouth daily.    . Marland Kitchenabapentin (NEURONTIN) 300 MG capsule Take 2 capsules (600 mg total) by mouth at bedtime.    . Marland KitchenuaiFENesin-dextromethorphan (ROBITUSSIN DM) 100-10 MG/5ML syrup Take 10 mLs by mouth every 4 (four) hours as needed for cough. 118 mL 0  . hydrOXYzine (ATARAX/VISTARIL) 10 MG tablet Place 1 tablet (10 mg total) into feeding tube 3 (three) times daily as needed for anxiety. 30 tablet 2  . ipratropium-albuterol (DUONEB) 0.5-2.5 (3)  MG/3ML SOLN Take 3 mLs by nebulization every 6 (six) hours as needed. 360 mL 2  . ketotifen (ZADITOR) 0.025 % ophthalmic solution Place 1 drop into both eyes 2 (two) times daily. 5 mL 0  . lidocaine (LIDODERM) 5 % Place 1 patch onto the skin daily. Remove & Discard patch within 12 hours or as directed by MD 30 patch 0  . melatonin 5 MG TABS Take 1 tablet (5 mg total) by mouth at bedtime. 30 tablet 2  . midodrine (PROAMATINE) 5 MG tablet Take 1 tablet (5 mg total) by mouth 2 (two) times daily with a meal. 60 tablet 2  . oxyCODONE (OXY IR/ROXICODONE) 5 MG immediate release tablet Place 1 tablet (5 mg total) into feeding tube every 6 (six) hours as needed for severe pain. 15 tablet 0  . pantoprazole sodium (PROTONIX) 40 mg/20 mL PACK Place 20 mLs (40 mg total) into feeding tube daily. 600 mL 2  . polyethylene glycol (MIRALAX / GLYCOLAX) 17  g packet Place 17 g into feeding tube 2 (two) times daily as needed for moderate constipation. 14 each 0  . potassium chloride SA (KLOR-CON) 20 MEQ tablet Take 1 tablet (20 mEq total) by mouth daily. 30 tablet 3  . saccharomyces boulardii (FLORASTOR) 250 MG capsule Take 1 capsule (250 mg total) by mouth 2 (two) times daily. 60 capsule 0  . scopolamine (TRANSDERM-SCOP) 1 MG/3DAYS Place 1 patch (1.5 mg total) onto the skin every 3 (three) days. 10 patch 12  . vitamin B-12 (CYANOCOBALAMIN) 500 MCG tablet Take 1 tablet (500 mcg total) by mouth daily. 30 tablet 3   No current facility-administered medications for this visit.    PHYSICAL EXAMINATION: ECOG PERFORMANCE STATUS: 2 - Symptomatic, <50% confined to bed  Vitals:   07/02/20 1046  BP: 121/73  Pulse: 63  Resp: 20  Temp: (!) 96.8 F (36 C)  SpO2: 93%   Filed Weights   07/02/20 1046  Weight: 229 lb 4.8 oz (104 kg)   GENERAL:alert, no distress and comfortable SKIN: no rash  EYES: sclera clear Neck: Lymphadenopathy resolved, LUNGS: clear with normal breathing effort. Trach in place. HEART: regular  rate & rhythm.  Extremities: Marked pitting edema improving.  Chronic venous stasis changes. ABDOMEN:abdomen soft, non-tender and normal bowel sounds. Feeding tube in place/capped, no drainage NEURO: alert & oriented x 3 with fluent speech   LABORATORY DATA:  I have reviewed the data as listed CBC Latest Ref Rng & Units 06/11/2020 05/28/2020 05/10/2020  WBC 4.0 - 10.5 K/uL 5.5 4.2 4.7  Hemoglobin 12.0 - 15.0 g/dL 10.0(L) 9.4(L) 9.8(L)  Hematocrit 36.0 - 46.0 % 32.0(L) 29.2(L) 29.5(L)  Platelets 150 - 400 K/uL 276 157 179     CMP Latest Ref Rng & Units 06/11/2020 05/28/2020 05/10/2020  Glucose 70 - 99 mg/dL 95 86 97  BUN 6 - 20 mg/dL 14 22(H) 22(H)  Creatinine 0.44 - 1.00 mg/dL 1.41(H) 1.58(H) 1.39(H)  Sodium 135 - 145 mmol/L 138 140 138  Potassium 3.5 - 5.1 mmol/L 3.1(L) 4.0 3.8  Chloride 98 - 111 mmol/L 97(L) 99 102  CO2 22 - 32 mmol/L 31 32 27  Calcium 8.9 - 10.3 mg/dL 9.1 9.2 8.9  Total Protein 6.5 - 8.1 g/dL 7.3 6.6 6.9  Total Bilirubin 0.3 - 1.2 mg/dL 1.6(H) 1.2 0.5  Alkaline Phos 38 - 126 U/L 67 65 48  AST 15 - 41 U/L 17 11(L) 9(L)  ALT 0 - 44 U/L _0 RADIOGRAPHIC STUDIES: I have personally reviewed the radiological images as listed and agreed with the findings in the report. No results found.   ASSESSMENT & PLAN:   60 year old female  1.  Squamous cell carcinoma of the supraglottis, [id number], stage IVA -Began concurrent chemoradiation with cisplatin on 03/29/2020, -She has had chemo interruption for pneumonia and renal dysfunction and also partly driven by patient, rescheduled a few times , cancels and no shows several times. Unfortunately her GFR continues to be low, hence we couldn't proceed with C5. She completed 4 cycles of cisplatin weekly, radiation completed on 05/31/2020 PE, complete response in the neck. Awaiting restaging PET. Sent a message to Dr Pearlie Oyster team about an ideal timeline when they would like to repeat the PET scan.  2.  Respiratory On 5 L  oxygen which is her baseline requirement.  3.  Leg edema,  DVT noted in the right popliteal vein Started on eliquis. She took this for a month and says she cant afford  this Gave her a coupon for one month prescription and also sent a message to pharmacy team if we can assist her in the management and obtaining free drug since she is un insured.  4.  Renal dysfunction Improving  5. Weight loss,  Nutrition visit today Encouraged 3 meals a day Encouraged using G tube as instructed We provided some cartons of ensure as well.  RTC in 4 weeks.     Benay Pike, MD 07/02/20

## 2020-07-02 NOTE — Progress Notes (Addendum)
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   Met with patient and sister in clinic prior to MD.  Patient reports that appetite is better and that she is able to eat more variety of foods (steak, pork chop, hamburger, hot dog) without swallowing difficulty.  Typically eats brunch around 10:30-11am eggs, sausage, sometimes egg sandwich.  Then eats again around 2pm and it may just be mashed potatoes. Eats again around 4:30pm meat and vegetables (pork chop, green beans, mashed potatoes). Does not like the taste of ensure/boost shakes and has not been drinking them but putting them through feeding tube.  Reports putting 1 ensure through tube and 1-2 cartons of osmolite 1.5 through feeding tube.    Sister confirms that oral intake has improved over the last 2 weeks.  Reports swelling in lower extremities is improved  Medications: reviewed  Labs: reviewed  Anthropometrics:   Weight 229 lb 3 oz decreased from 245 lb 1/28.    257 lb 1/10  Estimated Energy Needs  Kcals: 2500-2800 Protein: 130-145 g Fluid: 2.5 L  NUTRITION DIAGNOSIS: Inadequate oral intake continues    INTERVENTION:  Concerned about continued weight loss.  Stressed importance of patient using at least 3 cartons of osmolite 1.5/ensure plus daily via feeding tube between meals.   Discussed what constitutes a meal (protein source and at least 1-2 side items).  If just eating mashed potatoes needs to add tube feeding.  If patient misses a meal needs to add carton of tube feeding to current regimen of 3 a day to try and minimize weight loss. Provided complimentary case of ensure enlive to patient today.   Patient receiving enteral services from [facility]. Reports that medicaid is still pending.  Message sent to [facility] regarding medicaid still pending for > 90 days.      MONITORING, EVALUATION, GOAL: weight trends, intake, tube feeding   NEXT VISIT: to be  determined  Veronica Horton B. Zenia Resides, Veronica Horton, [place] Registered Dietitian [phone] (mobile)

## 2020-07-06 ENCOUNTER — Other Ambulatory Visit: Payer: Self-pay

## 2020-07-06 DIAGNOSIS — C321 Malignant neoplasm of supraglottis: Secondary | ICD-10-CM

## 2020-07-15 ENCOUNTER — Encounter: Payer: Self-pay | Admitting: General Practice

## 2020-07-15 NOTE — Progress Notes (Signed)
[facility] CSW Progress Notes  Call from patient's sister, [name].  They were notified by [place] [employer] (Ms Harris/Tammy Nyra Capes) that patient's case is still pending as DDS has not received required medical records.  As this case is being handled by [employer], asked J McKeel to follow up and ensure records are received in order to review case. Medicaid application is still "pending."  Edwyna Shell, LCSW Clinical Social Worker Phone:  [phone]

## 2020-07-16 NOTE — Progress Notes (Signed)
  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  She did develop pneumonia for which she was hospitalized.  She also developed a DVT which was managed by medical oncology.  Her kidney function was suboptimal and chemotherapy was stopped prematurely.  There were some delays in her radiation therapy due to hospitalization while managing her comorbidities.  Plan: The patient will follow-up with radiation oncology in 2-3 weeks, approximately.  -----------------------------------  Eppie Gibson, MD

## 2020-07-20 ENCOUNTER — Telehealth: Payer: Self-pay | Admitting: *Deleted

## 2020-07-20 NOTE — Telephone Encounter (Signed)
RN returned VM from family member.  Family stated patient pulled out feeding tube.  RN recommended patient go to the ED.  Family verbalized understanding.

## 2020-07-22 ENCOUNTER — Telehealth: Payer: Self-pay

## 2020-07-22 NOTE — Telephone Encounter (Signed)
Returned call from patient's sister regarding patient's feeding tube coming out. Dr. Chryl Heck made aware of the situation. Per Dr. Chryl Heck - patient does not need to have new feeding tube placed. Patient has been noted to be eating without trouble and has been eating 3 meals a day. Patient and sister educated on keeping site covered and clean. Patient is to call if any signs of infection noted. Dr. Chryl Heck will re-evaluate the situation and need for a feeding tube at next scheduled office visit in April.

## 2020-07-23 ENCOUNTER — Telehealth: Payer: Self-pay

## 2020-07-23 NOTE — Telephone Encounter (Signed)
Per Dr. Chryl Heck and Dr. Isidore Moos I have contacted the patients sister Veronica Horton to inform her that the patient does indeed need to go to the ED to have her G tube reinserted, Veronica Horton verbalized understanding and states that they are unable to get the patient to the ED today but they will have her there tomorrow for the G tube reinsertion.

## 2020-07-23 NOTE — Telephone Encounter (Signed)
Veronica Horton has been made aware of the below information and states that she will have Veronica Horton at the ED tomorrow 07/24/20 for G tube reinsertion.

## 2020-07-23 NOTE — Telephone Encounter (Signed)
----- Message from Benay Pike, MD sent at 07/23/2020  1:31 PM EDT ----- Regarding: RE: Feeding Tube Veronica Horton see below  Looks like nutrition wants her to continue using the G tube, so she needs to go back to the ED today ----- Message ----- From: Jennet Maduro, RD Sent: 07/23/2020  12:45 PM EDT To: Karie Mainland, RD, Eppie Gibson, MD, # Subject: RE: Feeding Tube                               Dr Chryl Heck,  Morbidly obese patients can still be malnourished and/or become malnourished. I am wanting the patient's weight to stop dropping before pulling the tube or reducing the tube feeding.  When patient was seen on 1/31 by RD patient was giving 3 cartons of tube feeding and drinking ensure shakes mainly.  When I saw her on 3/4 at [facility] she was sick of drinking the shakes (ensure/boost) and started giving 1 via tube and taking 2 cartons of osmolite (so still giving 3 cartons of nutrition via tube). She had started eating more solid food orally at the 3/4 visit but still not enough to maintain her weight. She lost 16 lb in about 1 month (6% weight loss in month, significant). Part of this weight loss could have been associated with fluid loss (lower edema in extremities noted on 1/31 visit but improving on 3/4 visit) which I took into account. I want her to continue 3 cartons of nutrition via tube but I educated her about ways to add calories to oral diet because we want her to be eating more orally. She will be seen again on April 11th if weight is stable or increased (factoring in edema) we will begin to reduce tube feeding further.  Once she is off tube feeding completely tube needs to remain in place for at least 4 weeks without patient using feeding tube for nutrition or hydration and weight needs to remain stable.  If weight is stable then after 4 weeks would recommend that patient was appropriate for tube removal pending no plans for any more treatment.    Hope this helps.    Please feel free to  reach out with further questions.    Joli  ----- Message ----- From: Benay Pike, MD Sent: 07/23/2020  11:24 AM EDT To: Eppie Gibson, MD, Stephani Police Malmfelt, RN, # Subject: RE: Feeding Olena Heckle  I have a question for you. So this patient was morbidly obese  prior to diagnosis, so when would say is an adequate weight for her which could be a new baseline.  She told me she is eating well.  Thanks, ----- Message ----- From: Eppie Gibson, MD Sent: 07/23/2020   8:11 AM EDT To: Ernst Spell, RN, Jennet Maduro, RD, # Subject: RE: Feeding Donald Pore, can you call pt to advise her to go to ED today for PEG reinsertion before stoma closes up? Based on Joli's notes, she still needs it for adequate nutrition. Thanks! ----- Message ----- From: Jolaine Click, RN Sent: 07/22/2020  11:01 AM EDT To: Judson Roch  Isidore Moos, MD, Benay Pike, MD Subject: Feeding Tube                                   Hello,  Patient's sister called yesterday and followed-up today about patient accidentally pulling out feeding tube. Per nutrition's last note 3/4, patient is able to eat meals. However, she is still receiving supplemental nutrition through the G tube. Patient and patient's sister had been advised to go to the ER yesterday, but upon further follow-up patient has not gone yet, but it planning on going tomorrow morning. )Please advise on what you would recommend further for patient. Do you feel that patient still requires the feeding tube or should we inform the patient and family to allow it to heal?

## 2020-07-24 ENCOUNTER — Emergency Department (HOSPITAL_COMMUNITY): Payer: Medicaid Other

## 2020-07-24 ENCOUNTER — Other Ambulatory Visit: Payer: Self-pay

## 2020-07-24 ENCOUNTER — Emergency Department (HOSPITAL_COMMUNITY)
Admission: EM | Admit: 2020-07-24 | Discharge: 2020-07-24 | Disposition: A | Discharge status: Home / Self Care | Payer: Medicaid Other | Attending: Emergency Medicine | Admitting: Emergency Medicine

## 2020-07-24 ENCOUNTER — Encounter (HOSPITAL_COMMUNITY): Payer: Self-pay

## 2020-07-24 DIAGNOSIS — Z7901 Long term (current) use of anticoagulants: Secondary | ICD-10-CM | POA: Diagnosis not present

## 2020-07-24 DIAGNOSIS — Z85828 Personal history of other malignant neoplasm of skin: Secondary | ICD-10-CM | POA: Diagnosis not present

## 2020-07-24 DIAGNOSIS — Z79899 Other long term (current) drug therapy: Secondary | ICD-10-CM | POA: Diagnosis not present

## 2020-07-24 DIAGNOSIS — K9423 Gastrostomy malfunction: Secondary | ICD-10-CM | POA: Diagnosis present

## 2020-07-24 DIAGNOSIS — J441 Chronic obstructive pulmonary disease with (acute) exacerbation: Secondary | ICD-10-CM | POA: Diagnosis not present

## 2020-07-24 DIAGNOSIS — Z85818 Personal history of malignant neoplasm of other sites of lip, oral cavity, and pharynx: Secondary | ICD-10-CM | POA: Insufficient documentation

## 2020-07-24 DIAGNOSIS — I10 Essential (primary) hypertension: Secondary | ICD-10-CM | POA: Diagnosis not present

## 2020-07-24 DIAGNOSIS — T85528A Displacement of other gastrointestinal prosthetic devices, implants and grafts, initial encounter: Secondary | ICD-10-CM

## 2020-07-24 HISTORY — PX: IR GJ TUBE CHANGE: IMG1440

## 2020-07-24 MED ORDER — LIDOCAINE VISCOUS HCL 2 % MT SOLN
OROMUCOSAL | Status: AC
  Administered 2020-07-24: 5 mL
  Filled 2020-07-24: qty 15

## 2020-07-24 MED ORDER — IOHEXOL 300 MG/ML  SOLN
50.0000 mL | Freq: Once | INTRAMUSCULAR | Status: AC | PRN
  Administered 2020-07-24: 5 mL

## 2020-07-24 NOTE — ED Notes (Signed)
Patient back from IR, alert and oriented. New G tube in place.

## 2020-07-24 NOTE — Procedures (Signed)
Interventional Radiology Procedure Note  Procedure: Gastrostomy tube replacement  Complications: None  Estimated Blood Loss: None  Findings: Gastrostomy tract catheterized and tract dilated over guidewire. 4 Fr balloon retention gastrostomy tube placed with tip in body of stomach. OK to use.  Venetia Night. Kathlene Cote, M.D Pager:  [phone]

## 2020-07-24 NOTE — ED Triage Notes (Signed)
Pt arrived via walk in, requesting G tube replacement, states g tube was accidentally removed catching it on cabinet Sunday evening.

## 2020-07-24 NOTE — ED Provider Notes (Signed)
[facility] DEPT Provider Note   CSN: [id number] Arrival date & time: 07/24/20  1024     History Chief Complaint  Patient presents with  . G Tube Replacement    Veronica Horton is a 60 y.o. female who presents after her G-tube was pulled out last Sunday, 6 days ago when it got caught on a dresser.  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  She has the G-tube there for supplemental feedings because although she can eat by mouth she is unable to eat enough that way to sustain herself.  She has no other complaints.  HPI     Past Medical History:  Diagnosis Date  . Bronchitis   . Class 3 obesity 01/23/2020  . COPD (chronic obstructive pulmonary disease) ([place])   . Degenerative disc disease, lumbar   . Hiatal hernia   . Hypertension     Patient Active Problem List   Diagnosis Date Noted  . Pressure injury of skin   . Diarrhea   . Chronic respiratory failure ([place])   . SOB (shortness of breath)   . PNA (pneumonia) 04/24/2020  . Sepsis ([place]) 04/24/2020  . Abnormal findings on diagnostic imaging of abdomen 04/12/2020  . 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 Tracheostomy dependent ([place])   . Head and neck cancer ([place])   . Acute on chronic respiratory failure with hypoxia and hypercapnia ([place]) 03/13/2020  . Laryngeal mass 03/11/2020  . Acute respiratory failure with hypercapnia ([place]) 03/10/2020  . Acute exacerbation of chronic obstructive pulmonary disease (COPD) ([place]) 01/24/2020  . Acute respiratory failure with hypoxia and hypercapnia ([place]) 01/23/2020  . Class 3 obesity 01/23/2020  . Tobacco abuse 01/23/2020  . Pneumonia   . Aspiration pneumonitis ([place]) 01/17/2019  . Incisional hernia, without obstruction or gangrene   . COPD with acute exacerbation ([place]) 01/14/2019  . Small bowel obstruction ([place]) 01/13/2019  . COPD (chronic obstructive pulmonary disease) ([place])   . Hypertension   . Acute  respiratory failure with hypoxia ([place])   . Polycythemia     Past Surgical History:  Procedure Laterality Date  . BIOPSY  04/12/2020   Procedure: BIOPSY;  Surgeon: Wilford Corner, MD;  [facility];  Service: Gastroenterology;;  . CHOLECYSTECTOMY    . degenerative bone disease    . DIRECT LARYNGOSCOPY N/A 03/13/2020   Procedure: DIRECT LARYNGOSCOPY WITH BIOPSY;  Surgeon: Jason Coop, DO;  Location: [facility];  Service: ENT;  Laterality: N/A;  . FLEXIBLE SIGMOIDOSCOPY N/A 04/12/2020   Procedure: FLEXIBLE SIGMOIDOSCOPY;  Surgeon: Wilford Corner, MD;  [facility];  Service: Gastroenterology;  Laterality: N/A;  . INCISIONAL HERNIA REPAIR N/A 01/15/2019   Procedure: [name] HERNIORRHAPHY WITH MESH;  Surgeon: [name], MD;  Location: [facility];  Service: General;  Laterality: N/A;  . IR GASTROSTOMY TUBE MOD SED  03/22/2020  . IR RADIOLOGIST EVAL & MGMT  05/07/2020  . OMENTECTOMY N/A 01/15/2019   Procedure: OMENTECTOMY;  Surgeon: [name], MD;  Location: [facility];  Service: General;  Laterality: N/A;  . POLYPECTOMY  04/12/2020   Procedure: POLYPECTOMY;  Surgeon: Wilford Corner, MD;  [facility];  Service: Gastroenterology;;  . TRACHEOSTOMY TUBE PLACEMENT N/A 03/13/2020   Procedure: AWAKE TRACHEOSTOMY;  Surgeon: Jason Coop, DO;  Location: [facility];  Service: ENT;  Laterality: N/A;     OB History   No obstetric history on file.     Family History  Problem  Relation Age of Onset  . Hypertension Mother   . Sudden Cardiac Death Neg Hx     Social History   Tobacco Use  . Smoking status: Current Every Day Smoker    Packs/day: 1.00  . Smokeless tobacco: Never Used  Substance Use Topics  . Alcohol use: No  . Drug use: No    Home Medications Prior to Admission medications   Medication Sig Start Date End Date Taking? Authorizing Provider  Albuterol Sulfate (PROAIR RESPICLICK) 409 (90 Base) MCG/ACT AEPB See admin instructions.     [provider]  apixaban (ELIQUIS) 5 MG TABS tablet Take 1 tablet (5 mg total) by mouth 2 (two) times daily. 07/02/20   Benay Pike, MD  arformoterol (BROVANA) 15 MCG/2ML NEBU Take 2 mLs (15 mcg total) by nebulization 2 (two) times daily. 04/16/20   Pokhrel, Corrie Mckusick, MD  bisacodyl (DULCOLAX) 10 MG suppository Place 1 suppository (10 mg total) rectally daily as needed for severe constipation. 04/16/20   Pokhrel, Laxman, MD  budesonide (PULMICORT) 0.5 MG/2ML nebulizer solution Take 2 mLs (0.5 mg total) by nebulization 2 (two) times daily. 04/16/20   Pokhrel, Corrie Mckusick, MD  dexamethasone (DECADRON) 2 MG tablet Take 2 tablets (4 mg total) by mouth 2 (two) times daily. 04/30/20   Georgette Shell, MD  diclofenac Sodium (VOLTAREN) 1 % GEL Apply 2 g topically 4 (four) times daily as needed (neck/back pain). 04/16/20   Pokhrel, Corrie Mckusick, MD  diphenhydrAMINE (BENADRYL) 12.5 MG/5ML elixir Place 5 mLs (12.5 mg total) into feeding tube every 6 (six) hours as needed for itching. 04/16/20   Pokhrel, Corrie Mckusick, MD  diphenoxylate-atropine (LOMOTIL) 2.5-0.025 MG tablet Take 1 tablet by mouth 4 (four) times daily as needed for diarrhea or loose stools. 05/05/20 05/05/21  Erick Colace, NP  escitalopram (LEXAPRO) 5 MG tablet Take 1 tablet (5 mg total) by mouth daily. 05/01/20   Georgette Shell, MD  feeding supplement, ENSURE ENLIVE, (ENSURE ENLIVE) LIQD Take 237 mLs by mouth 2 (two) times daily between meals. 01/25/20   Manuella Ghazi, Pratik D, DO  folic acid (FOLVITE) 1 MG tablet Take 1 tablet (1 mg total) by mouth daily. 01/31/19   [name], MD  furosemide (LASIX) 40 MG tablet Take 40 mg by mouth daily.    [provider]  gabapentin (NEURONTIN) 300 MG capsule Take 2 capsules (600 mg total) by mouth at bedtime. 04/16/20   Pokhrel, Corrie Mckusick, MD  guaiFENesin-dextromethorphan (ROBITUSSIN DM) 100-10 MG/5ML syrup Take 10 mLs by mouth every 4 (four) hours as needed for cough. 06/11/20   Benay Pike, MD   hydrOXYzine (ATARAX/VISTARIL) 10 MG tablet Place 1 tablet (10 mg total) into feeding tube 3 (three) times daily as needed for anxiety. 04/16/20   Pokhrel, Laxman, MD  ipratropium-albuterol (DUONEB) 0.5-2.5 (3) MG/3ML SOLN Take 3 mLs by nebulization every 6 (six) hours as needed. 04/16/20 05/16/20  Pokhrel, Corrie Mckusick, MD  ketotifen (ZADITOR) 0.025 % ophthalmic solution Place 1 drop into both eyes 2 (two) times daily. 04/16/20   Pokhrel, Laxman, MD  lidocaine (LIDODERM) 5 % Place 1 patch onto the skin daily. Remove & Discard patch within 12 hours or as directed by MD 04/16/20   Pokhrel, Corrie Mckusick, MD  melatonin 5 MG TABS Take 1 tablet (5 mg total) by mouth at bedtime. 04/16/20   Pokhrel, Corrie Mckusick, MD  midodrine (PROAMATINE) 5 MG tablet Take 1 tablet (5 mg total) by mouth 2 (two) times daily with a meal. 04/30/20   Georgette Shell, MD  oxyCODONE (  OXY IR/ROXICODONE) 5 MG immediate release tablet Place 1 tablet (5 mg total) into feeding tube every 6 (six) hours as needed for severe pain. 04/16/20   Pokhrel, Corrie Mckusick, MD  pantoprazole sodium (PROTONIX) 40 mg/20 mL PACK Place 20 mLs (40 mg total) into feeding tube daily. 04/16/20 05/16/20  Pokhrel, Corrie Mckusick, MD  polyethylene glycol (MIRALAX / GLYCOLAX) 17 g packet Place 17 g into feeding tube 2 (two) times daily as needed for moderate constipation. 04/16/20   Pokhrel, Corrie Mckusick, MD  potassium chloride SA (KLOR-CON) 20 MEQ tablet Take 1 tablet (20 mEq total) by mouth daily. 06/16/20   Benay Pike, MD  saccharomyces boulardii (FLORASTOR) 250 MG capsule Take 1 capsule (250 mg total) by mouth 2 (two) times daily. 01/30/19   [name], MD  scopolamine (TRANSDERM-SCOP) 1 MG/3DAYS Place 1 patch (1.5 mg total) onto the skin every 3 (three) days. 04/18/20   Pokhrel, Corrie Mckusick, MD  vitamin B-12 (CYANOCOBALAMIN) 500 MCG tablet Take 1 tablet (500 mcg total) by mouth daily. 01/31/19   [name], MD  prochlorperazine (COMPAZINE) 10 MG tablet Take 1 tablet (10 mg total) by  mouth every 6 (six) hours as needed (Nausea or vomiting). Patient not taking: Reported on 06/11/2020 04/15/20 06/11/20  Benay Pike, MD    Allergies    Codeine and Penicillins  Review of Systems   Review of Systems Ten systems reviewed and are negative for acute change, except as noted in the HPI.   Physical Exam Updated Vital Signs BP 123/73 (BP Location: Left Arm)   Pulse 72   Temp 98.1 F (36.7 C) (Oral)   Resp 20   SpO2 92%   Physical Exam Vitals and nursing note reviewed.  Constitutional:      General: She is not in acute distress.    Appearance: She is well-developed. She is not diaphoretic.  HENT:     Head: Normocephalic and atraumatic.  Eyes:     General: No scleral icterus.    Conjunctiva/sclera: Conjunctivae normal.  Cardiovascular:     Rate and Rhythm: Normal rate and regular rhythm.     Heart sounds: Normal heart sounds. No murmur heard. No friction rub. No gallop.   Pulmonary:     Effort: Pulmonary effort is normal. No respiratory distress.     Breath sounds: Normal breath sounds.  Abdominal:     General: Bowel sounds are normal. There is no distension.     Palpations: Abdomen is soft. There is no mass.     Tenderness: There is no abdominal tenderness. There is no guarding.     Comments: Gastric stoma noted to be sealed over with granulation tissue and central scab.  Musculoskeletal:     Cervical back: Normal range of motion.  Skin:    General: Skin is warm and dry.  Neurological:     Mental Status: She is alert and oriented to person, place, and time.  Psychiatric:        Behavior: Behavior normal.     ED Results / Procedures / Treatments   Labs (all labs ordered are listed, but only abnormal results are displayed) Labs Reviewed - No data to display  EKG None  Radiology No results found.  Procedures Procedures  Medications Ordered in ED Medications - No data to display  ED Course  I have reviewed the triage vital signs and the  nursing notes.  Pertinent labs & imaging results that were available during my care of the patient were reviewed by me and considered in  my medical decision making (see chart for details).    MDM Rules/Calculators/A&P                          60 year old female here with G-tube which was removed accidentally 1 week ago.  It was replaced today by Dr. Kathlene Cote.  Patient is doing well and appears appropriate for discharge at this time Final Clinical Impression(s) / ED Diagnoses Final diagnoses:  None    Rx / DC Orders ED Discharge Orders    None       Margarita Mail, PA-C 07/24/20 1631    Truddie Hidden, MD 07/25/20 [phone]

## 2020-07-24 NOTE — ED Notes (Signed)
Patient transported to IR via stretcher.

## 2020-07-24 NOTE — Discharge Instructions (Addendum)
Return for any new or worsening symptoms.

## 2020-07-29 ENCOUNTER — Telehealth: Payer: Self-pay | Admitting: General Practice

## 2020-07-29 NOTE — Telephone Encounter (Signed)
[facility] CSW Progress Notes  Call to sister, she reports that patient now has full Medicaid.  They have not received the card yet.  Advised that they inform home health and oxygen/DME providers that Medicaid has been approved.  Sister reports no further needs at this time.  Edwyna Shell, LCSW Clinical Social Worker Phone:  [phone]

## 2020-08-09 ENCOUNTER — Encounter (HOSPITAL_COMMUNITY): Payer: Self-pay | Admitting: Hematology and Oncology

## 2020-08-09 ENCOUNTER — Inpatient Hospital Stay (HOSPITAL_COMMUNITY): Payer: Medicaid Other | Attending: Hematology | Admitting: Hematology and Oncology

## 2020-08-09 ENCOUNTER — Other Ambulatory Visit: Payer: Self-pay

## 2020-08-09 ENCOUNTER — Inpatient Hospital Stay (HOSPITAL_COMMUNITY): Payer: Medicaid Other | Admitting: Dietician

## 2020-08-09 VITALS — BP 117/74 | HR 69 | Temp 97.2°F | Resp 19 | Wt 229.7 lb

## 2020-08-09 DIAGNOSIS — Z5111 Encounter for antineoplastic chemotherapy: Secondary | ICD-10-CM | POA: Insufficient documentation

## 2020-08-09 DIAGNOSIS — Z880 Allergy status to penicillin: Secondary | ICD-10-CM | POA: Insufficient documentation

## 2020-08-09 DIAGNOSIS — R600 Localized edema: Secondary | ICD-10-CM | POA: Diagnosis not present

## 2020-08-09 DIAGNOSIS — Z7901 Long term (current) use of anticoagulants: Secondary | ICD-10-CM | POA: Insufficient documentation

## 2020-08-09 DIAGNOSIS — C321 Malignant neoplasm of supraglottis: Secondary | ICD-10-CM | POA: Diagnosis present

## 2020-08-09 DIAGNOSIS — Z79899 Other long term (current) drug therapy: Secondary | ICD-10-CM | POA: Insufficient documentation

## 2020-08-09 DIAGNOSIS — I82431 Acute embolism and thrombosis of right popliteal vein: Secondary | ICD-10-CM | POA: Insufficient documentation

## 2020-08-09 DIAGNOSIS — N289 Disorder of kidney and ureter, unspecified: Secondary | ICD-10-CM | POA: Diagnosis not present

## 2020-08-09 DIAGNOSIS — Z9049 Acquired absence of other specified parts of digestive tract: Secondary | ICD-10-CM | POA: Diagnosis not present

## 2020-08-09 DIAGNOSIS — R634 Abnormal weight loss: Secondary | ICD-10-CM | POA: Insufficient documentation

## 2020-08-09 DIAGNOSIS — Z931 Gastrostomy status: Secondary | ICD-10-CM | POA: Insufficient documentation

## 2020-08-09 DIAGNOSIS — R6883 Chills (without fever): Secondary | ICD-10-CM | POA: Insufficient documentation

## 2020-08-09 DIAGNOSIS — Z923 Personal history of irradiation: Secondary | ICD-10-CM | POA: Diagnosis not present

## 2020-08-09 DIAGNOSIS — Z885 Allergy status to narcotic agent status: Secondary | ICD-10-CM | POA: Diagnosis not present

## 2020-08-09 MED ORDER — AZITHROMYCIN 250 MG PO TABS: ORAL_TABLET | ORAL | 0 refills | Status: DC

## 2020-08-09 MED ORDER — LIDOCAINE 5 % EX PTCH: 1.0000 | MEDICATED_PATCH | CUTANEOUS | 0 refills | Status: DC

## 2020-08-09 NOTE — Progress Notes (Signed)
[facility]   [phone] [phone]   Clinic Follow up Note   Patient Care Team: 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[place])   03/22/2020 Cancer Staging   Staging form: Larynx - Supraglottis, AJCC 7th Edition - Clinical: Stage IVA (T4a, N2c, M0) - Signed by Eppie Gibson, MD on 03/22/2020   03/23/2020 - 03/23/2020 Chemotherapy   The patient had dexamethasone (DECADRON) 4 MG tablet, 8 mg, Oral, Daily, 0 of 1 cycle, Start date: --, End date: -- palonosetron (ALOXI) injection 0.25 mg, 0.25 mg, Intravenous,  Once, 0 of 10 cycles CISplatin (PLATINOL) 182 mg in sodium chloride 0.9 % 500 mL chemo infusion, 80 mg/m2, Intravenous,  Once, 0 of 10 cycles gemcitabine (GEMZAR) 2,280 mg in sodium chloride 0.9 % 250 mL chemo infusion, 1,000 mg/m2, Intravenous,  Once, 0 of 3 cycles fosaprepitant (EMEND) 150 mg in sodium chloride 0.9 % 145 mL IVPB, 150 mg, Intravenous,  Once, 0 of 10 cycles  for chemotherapy treatment.    03/29/2020 - 05/12/2020 Chemotherapy           CURRENT THERAPY:   ChemoRT with Cisplatin  Completed radiation on 05/31/2020 She received 4 weekly cycles of cisplatin, several interruptions due to infections, non compliance etc.  INTERVAL HISTORY:   She is here for follow up after chemotherapy She feels like mucus keeps plugging her trach, she has to try and cough it out.  She feels like this is more noticeable now. She also noticed some chills. No diarrhea, dysuria.  No hearing issues,  neuropathy. She maintained her weight since last visit. She was hoping to get her lidocaine patches refilled. Rest of the pertinent 10 point ROS reviewed and negative  MEDICAL HISTORY:  Past Medical History:  Diagnosis Date  . Bronchitis   . Class 3 obesity 01/23/2020  . COPD (chronic obstructive pulmonary disease) ([place])   . Degenerative disc disease, lumbar   . Hiatal hernia   . Hypertension     SURGICAL HISTORY: Past Surgical History:  Procedure Laterality Date  . BIOPSY  04/12/2020   Procedure: BIOPSY;  Surgeon: Wilford Corner, MD;  [facility];  Service: Gastroenterology;;  . CHOLECYSTECTOMY    . degenerative bone disease    . DIRECT LARYNGOSCOPY N/A 03/13/2020   Procedure: DIRECT LARYNGOSCOPY WITH BIOPSY;  Surgeon: Jason Coop, DO;  Location: [facility];  Service: ENT;  Laterality: N/A;  . FLEXIBLE SIGMOIDOSCOPY N/A 04/12/2020   Procedure: FLEXIBLE SIGMOIDOSCOPY;  Surgeon: Wilford Corner, MD;  [facility];  Service: Gastroenterology;  Laterality: N/A;  . INCISIONAL HERNIA REPAIR N/A 01/15/2019   Procedure: [name] HERNIORRHAPHY WITH MESH;  Surgeon: [name], MD;  [facility];  Service: General;  Laterality: N/A;  . IR GASTROSTOMY TUBE MOD SED  03/22/2020  . IR GJ TUBE CHANGE  07/24/2020  . IR RADIOLOGIST EVAL & MGMT  05/07/2020  . OMENTECTOMY N/A 01/15/2019   Procedure: OMENTECTOMY;  Surgeon: [name], MD;  [facility];  Service: General;  Laterality: N/A;  . POLYPECTOMY  04/12/2020   Procedure: POLYPECTOMY;  Surgeon: Wilford Corner, MD;  Location: [facility];  Service: Gastroenterology;;  . TRACHEOSTOMY TUBE PLACEMENT N/A 03/13/2020   Procedure: AWAKE TRACHEOSTOMY;  Surgeon: Jason Coop, DO;  [facility];  Service: ENT;  Laterality: N/A;    I have reviewed the social history and family history with the patient and they are unchanged from previous note.  ALLERGIES:  is allergic to codeine and  penicillins.  MEDICATIONS:  Current Outpatient Medications  Medication Sig Dispense Refill  . Albuterol Sulfate (PROAIR RESPICLICK) 179 (90 Base) MCG/ACT AEPB See admin instructions.    Marland Kitchen apixaban (ELIQUIS) 5 MG TABS tablet Take 1 tablet (5 mg total) by mouth 2 (two) times daily. 60 tablet 1  . arformoterol (BROVANA) 15 MCG/2ML NEBU Take 2 mLs (15 mcg total) by nebulization 2 (two) times daily. 120 mL 2  . bisacodyl (DULCOLAX) 10 MG suppository Place 1 suppository (10 mg total) rectally daily as needed for severe constipation. 12 suppository 0  . budesonide (PULMICORT) 0.5 MG/2ML nebulizer solution Take 2 mLs (0.5 mg total) by nebulization 2 (two) times daily. 120 mL 2  . dexamethasone (DECADRON) 2 MG tablet Take 2 tablets (4 mg total) by mouth 2 (two) times daily. 30 tablet 0  . diclofenac Sodium (VOLTAREN) 1 % GEL Apply 2 g topically 4 (four) times daily as needed (neck/back pain). 350 g 0  . diphenhydrAMINE (BENADRYL) 12.5 MG/5ML elixir Place 5 mLs (12.5 mg total) into feeding tube every 6 (six) hours as needed for itching. 120 mL 0  . diphenoxylate-atropine (LOMOTIL) 2.5-0.025 MG tablet Take 1 tablet by mouth 4 (four) times daily as needed for diarrhea or loose stools. 30 tablet 1  . escitalopram (LEXAPRO) 5 MG tablet Take 1 tablet (5 mg total) by mouth daily. 30 tablet 2  . feeding supplement, ENSURE ENLIVE, (ENSURE ENLIVE) LIQD Take 237 mLs by mouth 2 (two) times daily between meals. 150 mL 12  . folic acid (FOLVITE) 1 MG tablet Take 1 tablet (1 mg total) by mouth daily. 30 tablet 2  . furosemide (LASIX) 40 MG tablet Take 40 mg by mouth daily.    Marland Kitchen gabapentin (NEURONTIN) 300 MG capsule Take 2 capsules (600 mg total) by mouth at bedtime.    Marland Kitchen guaiFENesin-dextromethorphan (ROBITUSSIN DM) 100-10 MG/5ML syrup Take 10 mLs by mouth every 4 (four) hours as needed for cough. 118 mL 0  . hydrOXYzine (ATARAX/VISTARIL) 10 MG tablet Place 1 tablet (10 mg total) into feeding tube 3 (three) times daily  as needed for anxiety. 30 tablet 2  . ipratropium-albuterol (DUONEB) 0.5-2.5 (3) MG/3ML SOLN Take 3 mLs by nebulization every 6 (six) hours as needed. 360 mL 2  . ketotifen (ZADITOR) 0.025 % ophthalmic solution Place 1 drop into both eyes 2 (two) times daily. 5 mL 0  . lidocaine (LIDODERM) 5 % Place 1 patch onto the skin daily. Remove & Discard patch within 12 hours or as directed by MD 30 patch 0  . melatonin 5 MG TABS Take 1 tablet (5 mg total) by mouth at bedtime. 30 tablet 2  . midodrine (PROAMATINE) 5 MG tablet Take 1 tablet (5 mg total) by mouth 2 (two) times daily with a meal. 60 tablet 2  . oxyCODONE (OXY IR/ROXICODONE) 5 MG immediate release tablet Place 1 tablet (5 mg total) into feeding tube every 6 (six) hours as needed for severe pain. 15 tablet 0  . pantoprazole sodium (PROTONIX) 40 mg/20 mL PACK Place 20 mLs (40 mg total) into feeding tube daily. 600 mL 2  . polyethylene glycol (  MIRALAX / GLYCOLAX) 17 g packet Place 17 g into feeding tube 2 (two) times daily as needed for moderate constipation. 14 each 0  . potassium chloride SA (KLOR-CON) 20 MEQ tablet Take 1 tablet (20 mEq total) by mouth daily. 30 tablet 3  . saccharomyces boulardii (FLORASTOR) 250 MG capsule Take 1 capsule (250 mg total) by mouth 2 (two) times daily. 60 capsule 0  . scopolamine (TRANSDERM-SCOP) 1 MG/3DAYS Place 1 patch (1.5 mg total) onto the skin every 3 (three) days. 10 patch 12  . vitamin B-12 (CYANOCOBALAMIN) 500 MCG tablet Take 1 tablet (500 mcg total) by mouth daily. 30 tablet 3   No current facility-administered medications for this visit.    PHYSICAL EXAMINATION: ECOG PERFORMANCE STATUS: 2 - Symptomatic, <50% confined to bed  There were no vitals filed for this visit. There were no vitals filed for this visit.   GENERAL:alert, no distress and comfortable SKIN: no rash  EYES: sclera clear Neck: Lymphadenopathy resolved, LUNGS: scattered wheeze. Trach in place. HEART: regular rate & rhythm.   Extremities: BLE edema significantly improved, now 1+ Chronic venous stasis changes. ABDOMEN:abdomen soft, non-tender and normal bowel sounds. Feeding tube in place/capped, no purulent drainage NEURO: alert & oriented x 3 with fluent speech   LABORATORY DATA:  I have reviewed the data as listed CBC Latest Ref Rng & Units 07/02/2020 06/11/2020 05/28/2020  WBC 4.0 - 10.5 K/uL 6.9 5.5 4.2  Hemoglobin 12.0 - 15.0 g/dL 10.2(L) 10.0(L) 9.4(L)  Hematocrit 36.0 - 46.0 % 32.8(L) 32.0(L) 29.2(L)  Platelets 150 - 400 K/uL 291 276 157     CMP Latest Ref Rng & Units 07/02/2020 06/11/2020 05/28/2020  Glucose 70 - 99 mg/dL 85 95 86  BUN 6 - 20 mg/dL 21(H) 14 22(H)  Creatinine 0.44 - 1.00 mg/dL 1.39(H) 1.41(H) 1.58(H)  Sodium 135 - 145 mmol/L 133(L) 138 140  Potassium 3.5 - 5.1 mmol/L 3.9 3.1(L) 4.0  Chloride 98 - 111 mmol/L 95(L) 97(L) 99  CO2 22 - 32 mmol/L 27 31 32  Calcium 8.9 - 10.3 mg/dL 9.2 9.1 9.2  Total Protein 6.5 - 8.1 g/dL 7.8 7.3 6.6  Total Bilirubin 0.3 - 1.2 mg/dL 0.8 1.6(H) 1.2  Alkaline Phos 38 - 126 U/L 67 67 65  AST 15 - 41 U/L 14(L) 17 11(L)  ALT 0 - 44 U/L 10 9 7     RADIOGRAPHIC STUDIES: I have personally reviewed the radiological images as listed and agreed with the findings in the report. No results found.   ASSESSMENT & PLAN:   60 year old female  1.  Squamous cell carcinoma of the supraglottis, [id number], stage IVA -Began concurrent chemoradiation with cisplatin on 03/29/2020, -She has had chemo interruption for pneumonia and renal dysfunction and also partly driven by patient, rescheduled a few times , cancels and no showed several times. She completed 4 cycles of cisplatin weekly, radiation completed on 05/31/2020 PE, complete response in the neck. Awaiting restaging scans ordered by Dr Pearlie Oyster team  2.  Respiratory On 5 L oxygen which is her baseline requirement.  3.  Leg edema,  DVT noted in the right popliteal vein Started on eliquis. She took this for a  month. Gave her a coupon for one month prescription She feels so much better LE swelling has almost resolved.  4. Weight loss,  Nutrition visit today Maintaining weight at this time. Using G tube feeding and oral feeding.  5. Cough, worsened since last visit, and has some chills Scattered wheezes on exam. Will  give a trial of Z pak for possible bronchitis.  RTC in 6-7 weeks.     Benay Pike, MD 08/09/20

## 2020-08-09 NOTE — Progress Notes (Signed)
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  3/26 - ED visit due to PEG dislodgement    Met with patient and sister in clinic. Patient  reports appetite continues to improve, eating a variety of foods. Usually eats 3 times daily, recalls eggs, cereals, pancakes, french toast for breakfast, assorted sandwiches, ham biscuit from [employer] for lunch, and meat with starch and vegetables for dinner. Patient is giving 2 cartons of Osmolite, 2-3 Ensure and 2 (20oz) bottles of water via tube daily. She "sips" water, tea, and diet Dr. Malachi Bonds throughout the day by mouth. Yesterday patient had a lot going on and did not eat as normal. She recalls baked beans, creamed potatoes, small piece of ham, and a large container of chicken livers. She gave 2 cartons of Osmolite and 1 Ensure via tube. Patient reports she is need of formula and trach supplies. She reports [facility] has made phone calls threatening to pick up provided supplies due to non payment prior to [facility] approval on 3/31.   Medications: reviewed  Labs: reviewed  Anthropometrics: Weight 229 lb 11.2 oz stable over the last month  3/4 - 229 lb 4.8 oz 2/11 - 235 lb 6.4 oz 1/28 - 245 lb 3.2 oz  Estimated Energy Needs  Kcals: 2500-2800 Protein: 130-145 g Fluid: 2.5 L  NUTRITION DIAGNOSIS: Inadequate oral intake improving   INTERVENTION:  Continue 2 cartons Osmolite 1.5 and 2-3 Ensure Plus via tube between meals Discussed good sources of protein at each meal Provided complimentary case of Ensure Enlive today Patient receiving enteral services from [employer]. Medicaid approved 3/31, reports she has not yet received card.  Sister contacting company to inform of needed enteral and trach supplies.  Sister will call RD if issues arise with getting tube feed formula/supplies RD contact information given   MONITORING, EVALUATION, GOAL: weight trends, intake   NEXT VISIT: To  be determined

## 2020-08-10 ENCOUNTER — Telehealth: Payer: Self-pay | Admitting: Hematology and Oncology

## 2020-08-10 NOTE — Telephone Encounter (Signed)
Called pt per 4/11 sch msg. Spoke to pt's sister who said she would call back to schedule appt same day as the scan

## 2020-08-12 ENCOUNTER — Encounter: Payer: Self-pay | Admitting: General Practice

## 2020-08-12 NOTE — Progress Notes (Signed)
[place] CSW Progress Notes  Request received from [employer], dietitian, to help problem solve issues w getting oxygen and enteral supplies.  Per patient and sister, [employer] is refusing to supply additional supplies and state they need to take back equipment previously provided.  CSW reviewed chart.  Appears patient was diagnosed w COPD and was set up for charity care for oxygen in [employer] at discharge from inpatient in Oct 2020.  Typically charity care lasts for 6 months.  After the charity care period, patients would be expected to begin to pay on their account.  Patient has applied for [place] for "years" per sister - her application submitted on 04/20/20 resulted in a Medicaid approval on 07/22/20.  Spoke w Bethanne Ginger, [employer] representative handling the case.  Medicaid will pay for supplies provided when Medicaid was active and will pay retroactively to the point when successful application was submitted.  It appears the outstanding bills relate to supplies provided prior to 03/2020.  [employer] rep will continue to work with his company to determine how they can help patient.    Spoke w sister as patient has difficulty w speech on phone.  Per sister, family has not received a bill from [facility] for outstanding charges. Per sister, family had sent a $10 money order to [employer] in Jan and Feb 2022. Patient does not have a credit card or bank account which could be drafted.  Sister also states that they have not gotten formal notification of Social Security disability or received a Medicaid card - advised them to contact DSS to follow up on Medicaid and Social Security Administration to follow up on disability.    Conveyed this information to [employer] rep.  He will help as best he can so patient continues to get the supplies needed for her care.    Edwyna Shell, LCSW Clinical Social Worker Phone:  [phone]

## 2020-08-17 ENCOUNTER — Encounter: Payer: Self-pay | Admitting: General Practice

## 2020-08-17 NOTE — Progress Notes (Signed)
[place] CSW Progress Notes  "We received a referral for Ms. Ryther on 11/292021, interviewed her on 04/06/2020 and filed her claim on 04/07/2020. From the notes, it appears that DDS closed he case on 06/21/2020 due to lack of participation. We have requested the denial letter but we haven't received it. The 60th day to appeal is 08/19/20. That is if the date we were given is correct. We have not heard from Ina or her sister since December."  Message conveyed to patient via sister [name], stressed the urgency of calling [facility] as the deadline for appeals is in two days.  Edwyna Shell, LCSW Clinical Social Worker Phone:  [phone]

## 2020-08-25 ENCOUNTER — Encounter: Payer: Self-pay | Admitting: General Practice

## 2020-08-25 NOTE — Progress Notes (Signed)
[place] CSW Progress Notes  Secure email from [employer].  "We have resolved the issue with the SSI application for [facility]. The case is now a DDS. I have been in touch with [name] and let her know that as soon as we received the barcode we would send them in. We received the barcode today and I have submitted all medical records from November 2021 to present."  Patient's SSI disability application is still pending, however, per the above, Disability Determination Services now has needed medical records.  Edwyna Shell, LCSW Clinical Social Worker Phone:  [phone]

## 2020-08-26 ENCOUNTER — Telehealth: Payer: Self-pay | Admitting: *Deleted

## 2020-08-26 NOTE — Telephone Encounter (Signed)
CALLED PATIENT'S SISTER- [name] JOHNSON TO INFORM OF STAT LABS ON 08-30-20 @ 11:30 AM @ [place], SPOKE WITH PATIENT'S SISTER- [name] JOHNSON AND SHE IS AWARE OF THIS APPT.

## 2020-08-30 ENCOUNTER — Ambulatory Visit (HOSPITAL_COMMUNITY): Payer: Medicaid Other

## 2020-08-30 ENCOUNTER — Ambulatory Visit: Payer: Medicaid Other | Attending: Radiation Oncology

## 2020-08-30 DIAGNOSIS — C760 Malignant neoplasm of head, face and neck: Secondary | ICD-10-CM | POA: Insufficient documentation

## 2020-08-30 DIAGNOSIS — C321 Malignant neoplasm of supraglottis: Secondary | ICD-10-CM | POA: Insufficient documentation

## 2020-08-31 ENCOUNTER — Ambulatory Visit: Payer: Self-pay | Admitting: Radiation Oncology

## 2020-09-16 ENCOUNTER — Encounter: Payer: Self-pay | Admitting: General Practice

## 2020-09-16 NOTE — Progress Notes (Signed)
[place] CSW Progress Notes  Request from [place] to call sister, reports sister has questions about whether patient's Medicaid has been approved.  Spoke w sister Etheleen Mayhew.  She states patient has both SSI disability and Medicaid fully approved, no concerns about either of these programs.  Sister's question is whether Medicaid has approved some upcoming tests - asked RN to call back and discuss this issue w family member.  Edwyna Shell, LCSW Clinical Social Worker Phone:  [phone]

## 2020-09-17 ENCOUNTER — Ambulatory Visit: Payer: Medicaid Other | Admitting: Radiation Oncology

## 2020-09-20 ENCOUNTER — Other Ambulatory Visit: Payer: Self-pay

## 2020-09-20 DIAGNOSIS — C321 Malignant neoplasm of supraglottis: Secondary | ICD-10-CM

## 2020-09-21 ENCOUNTER — Telehealth: Payer: Self-pay

## 2020-09-21 NOTE — Telephone Encounter (Signed)
Called and spoke with patient's sister [name]. Informed her that patient's Medicaid had approved the CT scan Dr. Isidore Moos ordered, but unfortunately there is currently a shortage of the contrast needed for the scan. Informed her that a PET scan was ordered as a substitute, but since it is not what is typically ordered in this situation, our authorization team was still waiting to hear back from insurance about approval status. Told [name] that once we had an update, someone would be contacting her to reschedule patient's scan appointment and F/U with Dr. Isidore Moos to review scan results. [name] verbalized understanding of plan and appreciation of call. No other needs identified at this time, but she knows to call me back should she or the patient have any other questions/concerns.

## 2020-09-24 ENCOUNTER — Ambulatory Visit: Payer: Medicaid Other | Admitting: Radiation Oncology

## 2020-10-22 ENCOUNTER — Other Ambulatory Visit: Payer: Self-pay

## 2020-10-22 ENCOUNTER — Ambulatory Visit (HOSPITAL_COMMUNITY)
Admission: RE | Admit: 2020-10-22 | Discharge: 2020-10-22 | Disposition: A | Discharge status: Home / Self Care | Payer: Medicaid Other | Source: Ambulatory Visit | Attending: Radiation Oncology | Admitting: Radiation Oncology

## 2020-10-22 DIAGNOSIS — I2510 Atherosclerotic heart disease of native coronary artery without angina pectoris: Secondary | ICD-10-CM | POA: Diagnosis not present

## 2020-10-22 DIAGNOSIS — C321 Malignant neoplasm of supraglottis: Secondary | ICD-10-CM | POA: Insufficient documentation

## 2020-10-22 DIAGNOSIS — I700 Atherosclerosis of aorta: Secondary | ICD-10-CM | POA: Insufficient documentation

## 2020-10-22 LAB — GLUCOSE, CAPILLARY: Glucose-Capillary: 73 mg/dL (ref 70–99)

## 2020-10-22 MED ORDER — FLUDEOXYGLUCOSE F - 18 (FDG) INJECTION
11.6000 | Freq: Once | INTRAVENOUS | Status: AC
  Administered 2020-10-22: 11.6 via INTRAVENOUS

## 2020-11-03 ENCOUNTER — Other Ambulatory Visit: Payer: Self-pay

## 2020-11-03 DIAGNOSIS — C321 Malignant neoplasm of supraglottis: Secondary | ICD-10-CM

## 2020-11-08 ENCOUNTER — Other Ambulatory Visit (HOSPITAL_COMMUNITY)
Admission: RE | Admit: 2020-11-08 | Discharge: 2020-11-08 | Disposition: A | Discharge status: Home / Self Care | Payer: Medicaid Other | Source: Ambulatory Visit | Attending: Radiation Oncology | Admitting: Radiation Oncology

## 2020-11-08 ENCOUNTER — Other Ambulatory Visit: Payer: Self-pay

## 2020-11-08 DIAGNOSIS — Z01812 Encounter for preprocedural laboratory examination: Secondary | ICD-10-CM | POA: Insufficient documentation

## 2020-11-08 DIAGNOSIS — U071 COVID-19: Secondary | ICD-10-CM | POA: Insufficient documentation

## 2020-11-09 LAB — SARS CORONAVIRUS 2 (TAT 6-24 HRS): SARS Coronavirus 2: POSITIVE — AB

## 2020-11-09 NOTE — Progress Notes (Signed)
Oncology Nurse Navigator Documentation   I spoke with Ms. Strojny's sister [name] regarding our ENT conference discussion about her recent PET scan results. I told [name] that Dr. Isidore Moos has ordered a repeat CT of Ms. Hamberger's chest and neck to be completed in November and she will see Dr. Isidore Moos on 03/09/21 for results. I also informed her of an appointment to see Dr. Fredric Dine ENT to follow up after her treatment on 7/29 at 1:45. She is agreeable to the above appointments and CT scans and wrote the appointments down while I was talking with her. She knows to call me if she has any further questions or concerns.   Harlow Asa RN, BSN, OCN Head & Neck Oncology Nurse [place] [facility] Phone # [phone]  Fax # [phone]

## 2020-11-10 ENCOUNTER — Inpatient Hospital Stay (HOSPITAL_COMMUNITY): Admission: RE | Admit: 2020-11-10 | Payer: Medicaid Other | Source: Ambulatory Visit

## 2020-11-22 ENCOUNTER — Telehealth (HOSPITAL_COMMUNITY): Payer: Self-pay | Admitting: Dietician

## 2020-11-22 ENCOUNTER — Encounter (HOSPITAL_COMMUNITY): Payer: Medicaid Other | Admitting: Dietician

## 2020-11-22 NOTE — Telephone Encounter (Signed)
Nutrition Follow-up:  Patient with laryngeal cancer s/p PEG. She has completed radiation and chemotherapy.  Spoke with sister via telephone. She reports patient is "doing good" and "eating better" Sister recalls patient is eating finely chopped meats, veggies, fruit, cheese, "whatever she can get her hands on" Patient drinks mostly water, sometimes orange juice. Sister reports patient continues to use feeding tube. Patient is giving 1 carton Osmolite 1.5 every day with water flush before and after. Sister reports she will give up to 3-4 cartons if patient has "bad day" with eating orally. Sister denies nausea, vomiting, constipation, diarrhea, endorses stable weights. Sister reports patient is getting out of the house more, says she went to the grocery store yesterday.   Medications: reviewed  Labs: reviewed  Anthropometrics: No new weights for review, pt weighed 229 lb 11.2 oz on 4/11  3/4 - 229 lb 4.8 oz 2/11 - 235 lb 6.4 oz 1/28 - 245 lb 3.2 oz  Estimated Energy Needs  Kcals: 2500-2800 Protein: 139-145 Fluid: 2.5 L  NUTRITION DIAGNOSIS: Inadequate oral intake improved, pt supplementing with tube feedings as needed   INTERVENTION:  Encouraged to continue eating soft moist high protein foods orally Patient continues to give 1 carton Osmolite 1.5 via PEG daily, increases tube feedings as needed with decreased oral intake Patient reports receiving formula and supplies from [place] when needed Patient and sister have contact information    MONITORING, EVALUATION, GOAL: weight trends, intake, tube feeding   NEXT VISIT: f/u Thursday September 8 via telephone

## 2020-11-28 ENCOUNTER — Emergency Department (HOSPITAL_COMMUNITY): Payer: Medicaid Other

## 2020-11-28 ENCOUNTER — Encounter (HOSPITAL_COMMUNITY): Payer: Self-pay | Admitting: *Deleted

## 2020-11-28 ENCOUNTER — Inpatient Hospital Stay (HOSPITAL_COMMUNITY)
Admission: EM | Admit: 2020-11-28 | Discharge: 2020-12-13 | DRG: 091 | Disposition: A | Discharge status: Home Health | Payer: Medicaid Other | Attending: Internal Medicine | Admitting: Internal Medicine

## 2020-11-28 ENCOUNTER — Other Ambulatory Visit: Payer: Self-pay

## 2020-11-28 DIAGNOSIS — R339 Retention of urine, unspecified: Secondary | ICD-10-CM | POA: Diagnosis present

## 2020-11-28 DIAGNOSIS — I129 Hypertensive chronic kidney disease with stage 1 through stage 4 chronic kidney disease, or unspecified chronic kidney disease: Secondary | ICD-10-CM | POA: Diagnosis present

## 2020-11-28 DIAGNOSIS — S301XXA Contusion of abdominal wall, initial encounter: Secondary | ICD-10-CM | POA: Diagnosis present

## 2020-11-28 DIAGNOSIS — C329 Malignant neoplasm of larynx, unspecified: Secondary | ICD-10-CM

## 2020-11-28 DIAGNOSIS — C321 Malignant neoplasm of supraglottis: Secondary | ICD-10-CM | POA: Diagnosis present

## 2020-11-28 DIAGNOSIS — D62 Acute posthemorrhagic anemia: Secondary | ICD-10-CM | POA: Diagnosis present

## 2020-11-28 DIAGNOSIS — X58XXXA Exposure to other specified factors, initial encounter: Secondary | ICD-10-CM | POA: Diagnosis present

## 2020-11-28 DIAGNOSIS — D320 Benign neoplasm of cerebral meninges: Secondary | ICD-10-CM | POA: Diagnosis present

## 2020-11-28 DIAGNOSIS — J449 Chronic obstructive pulmonary disease, unspecified: Secondary | ICD-10-CM | POA: Diagnosis present

## 2020-11-28 DIAGNOSIS — G928 Other toxic encephalopathy: Secondary | ICD-10-CM | POA: Diagnosis present

## 2020-11-28 DIAGNOSIS — Z20822 Contact with and (suspected) exposure to covid-19: Secondary | ICD-10-CM | POA: Diagnosis present

## 2020-11-28 DIAGNOSIS — R0902 Hypoxemia: Secondary | ICD-10-CM | POA: Diagnosis not present

## 2020-11-28 DIAGNOSIS — Z7901 Long term (current) use of anticoagulants: Secondary | ICD-10-CM

## 2020-11-28 DIAGNOSIS — Z880 Allergy status to penicillin: Secondary | ICD-10-CM

## 2020-11-28 DIAGNOSIS — Z885 Allergy status to narcotic agent status: Secondary | ICD-10-CM

## 2020-11-28 DIAGNOSIS — G936 Cerebral edema: Secondary | ICD-10-CM | POA: Diagnosis present

## 2020-11-28 DIAGNOSIS — N1832 Chronic kidney disease, stage 3b: Secondary | ICD-10-CM | POA: Diagnosis present

## 2020-11-28 DIAGNOSIS — T502X5A Adverse effect of carbonic-anhydrase inhibitors, benzothiadiazides and other diuretics, initial encounter: Secondary | ICD-10-CM | POA: Diagnosis present

## 2020-11-28 DIAGNOSIS — K449 Diaphragmatic hernia without obstruction or gangrene: Secondary | ICD-10-CM | POA: Diagnosis present

## 2020-11-28 DIAGNOSIS — J690 Pneumonitis due to inhalation of food and vomit: Secondary | ICD-10-CM | POA: Diagnosis present

## 2020-11-28 DIAGNOSIS — Z7189 Other specified counseling: Secondary | ICD-10-CM | POA: Diagnosis not present

## 2020-11-28 DIAGNOSIS — J9621 Acute and chronic respiratory failure with hypoxia: Secondary | ICD-10-CM | POA: Diagnosis present

## 2020-11-28 DIAGNOSIS — R0600 Dyspnea, unspecified: Secondary | ICD-10-CM

## 2020-11-28 DIAGNOSIS — J9601 Acute respiratory failure with hypoxia: Secondary | ICD-10-CM | POA: Diagnosis not present

## 2020-11-28 DIAGNOSIS — Z7951 Long term (current) use of inhaled steroids: Secondary | ICD-10-CM

## 2020-11-28 DIAGNOSIS — Z781 Physical restraint status: Secondary | ICD-10-CM

## 2020-11-28 DIAGNOSIS — Z66 Do not resuscitate: Secondary | ICD-10-CM | POA: Diagnosis present

## 2020-11-28 DIAGNOSIS — J189 Pneumonia, unspecified organism: Secondary | ICD-10-CM

## 2020-11-28 DIAGNOSIS — K5900 Constipation, unspecified: Secondary | ICD-10-CM | POA: Diagnosis present

## 2020-11-28 DIAGNOSIS — R569 Unspecified convulsions: Secondary | ICD-10-CM | POA: Diagnosis not present

## 2020-11-28 DIAGNOSIS — Z79899 Other long term (current) drug therapy: Secondary | ICD-10-CM

## 2020-11-28 DIAGNOSIS — E861 Hypovolemia: Secondary | ICD-10-CM | POA: Diagnosis present

## 2020-11-28 DIAGNOSIS — G40909 Epilepsy, unspecified, not intractable, without status epilepticus: Secondary | ICD-10-CM

## 2020-11-28 DIAGNOSIS — Z6837 Body mass index (BMI) 37.0-37.9, adult: Secondary | ICD-10-CM

## 2020-11-28 DIAGNOSIS — E876 Hypokalemia: Secondary | ICD-10-CM | POA: Diagnosis not present

## 2020-11-28 DIAGNOSIS — G40901 Epilepsy, unspecified, not intractable, with status epilepticus: Secondary | ICD-10-CM | POA: Diagnosis present

## 2020-11-28 DIAGNOSIS — D329 Benign neoplasm of meninges, unspecified: Secondary | ICD-10-CM | POA: Diagnosis not present

## 2020-11-28 DIAGNOSIS — R5381 Other malaise: Secondary | ICD-10-CM | POA: Diagnosis present

## 2020-11-28 DIAGNOSIS — I9589 Other hypotension: Secondary | ICD-10-CM | POA: Diagnosis present

## 2020-11-28 DIAGNOSIS — N170 Acute kidney failure with tubular necrosis: Secondary | ICD-10-CM | POA: Diagnosis present

## 2020-11-28 DIAGNOSIS — Z8249 Family history of ischemic heart disease and other diseases of the circulatory system: Secondary | ICD-10-CM

## 2020-11-28 DIAGNOSIS — R001 Bradycardia, unspecified: Secondary | ICD-10-CM | POA: Diagnosis present

## 2020-11-28 DIAGNOSIS — T380X5A Adverse effect of glucocorticoids and synthetic analogues, initial encounter: Secondary | ICD-10-CM | POA: Diagnosis present

## 2020-11-28 DIAGNOSIS — Z515 Encounter for palliative care: Secondary | ICD-10-CM | POA: Diagnosis not present

## 2020-11-28 DIAGNOSIS — Z86718 Personal history of other venous thrombosis and embolism: Secondary | ICD-10-CM

## 2020-11-28 DIAGNOSIS — Z930 Tracheostomy status: Secondary | ICD-10-CM | POA: Diagnosis not present

## 2020-11-28 DIAGNOSIS — Z931 Gastrostomy status: Secondary | ICD-10-CM

## 2020-11-28 DIAGNOSIS — F17210 Nicotine dependence, cigarettes, uncomplicated: Secondary | ICD-10-CM | POA: Diagnosis present

## 2020-11-28 HISTORY — DX: Unspecified convulsions: R56.9

## 2020-11-28 HISTORY — DX: Malignant neoplasm of pharynx, unspecified: C14.0

## 2020-11-28 LAB — CBC WITH DIFFERENTIAL/PLATELET
Abs Immature Granulocytes: 0.02 10*3/uL (ref 0.00–0.07)
Basophils Absolute: 0 10*3/uL (ref 0.0–0.1)
Basophils Relative: 1 %
Eosinophils Absolute: 0.1 10*3/uL (ref 0.0–0.5)
Eosinophils Relative: 1 %
HCT: 33.5 % — ABNORMAL LOW (ref 36.0–46.0)
Hemoglobin: 11 g/dL — ABNORMAL LOW (ref 12.0–15.0)
Immature Granulocytes: 0 %
Lymphocytes Relative: 16 %
Lymphs Abs: 0.7 10*3/uL (ref 0.7–4.0)
MCH: 32.6 pg (ref 26.0–34.0)
MCHC: 32.8 g/dL (ref 30.0–36.0)
MCV: 99.4 fL (ref 80.0–100.0)
Monocytes Absolute: 0.4 10*3/uL (ref 0.1–1.0)
Monocytes Relative: 10 %
Neutro Abs: 3.2 10*3/uL (ref 1.7–7.7)
Neutrophils Relative %: 72 %
Platelets: 236 10*3/uL (ref 150–400)
RBC: 3.37 MIL/uL — ABNORMAL LOW (ref 3.87–5.11)
RDW: 16.5 % — ABNORMAL HIGH (ref 11.5–15.5)
WBC: 4.5 10*3/uL (ref 4.0–10.5)
nRBC: 0 % (ref 0.0–0.2)

## 2020-11-28 LAB — URINALYSIS, ROUTINE W REFLEX MICROSCOPIC
Bilirubin Urine: NEGATIVE
Glucose, UA: NEGATIVE mg/dL
Ketones, ur: NEGATIVE mg/dL
Nitrite: NEGATIVE
Protein, ur: 30 mg/dL — AB
Specific Gravity, Urine: 1.004 — ABNORMAL LOW (ref 1.005–1.030)
pH: 7 (ref 5.0–8.0)

## 2020-11-28 LAB — COMPREHENSIVE METABOLIC PANEL
ALT: 16 U/L (ref 0–44)
AST: 36 U/L (ref 15–41)
Albumin: 3.7 g/dL (ref 3.5–5.0)
Alkaline Phosphatase: 44 U/L (ref 38–126)
Anion gap: 15 (ref 5–15)
BUN: 14 mg/dL (ref 6–20)
CO2: 27 mmol/L (ref 22–32)
Calcium: 9.5 mg/dL (ref 8.9–10.3)
Chloride: 96 mmol/L — ABNORMAL LOW (ref 98–111)
Creatinine, Ser: 2.06 mg/dL — ABNORMAL HIGH (ref 0.44–1.00)
GFR, Estimated: 27 mL/min — ABNORMAL LOW (ref >60)
Glucose, Bld: 102 mg/dL — ABNORMAL HIGH (ref 70–99)
Potassium: 3.6 mmol/L (ref 3.5–5.1)
Sodium: 138 mmol/L (ref 135–145)
Total Bilirubin: 1.2 mg/dL (ref 0.3–1.2)
Total Protein: 7.3 g/dL (ref 6.5–8.1)

## 2020-11-28 LAB — BLOOD GAS, ARTERIAL
Acid-Base Excess: 7.6 mmol/L — ABNORMAL HIGH (ref 0.0–2.0)
Bicarbonate: 30.3 mmol/L — ABNORMAL HIGH (ref 20.0–28.0)
FIO2: 98
O2 Saturation: 96.6 %
Patient temperature: 37
pCO2 arterial: 55.7 mmHg — ABNORMAL HIGH (ref 32.0–48.0)
pH, Arterial: 7.385 (ref 7.350–7.450)
pO2, Arterial: 98.5 mmHg (ref 83.0–108.0)

## 2020-11-28 LAB — RESP PANEL BY RT-PCR (FLU A&B, COVID) ARPGX2
Influenza A by PCR: NEGATIVE
Influenza B by PCR: NEGATIVE
SARS Coronavirus 2 by RT PCR: NEGATIVE

## 2020-11-28 LAB — RAPID URINE DRUG SCREEN, HOSP PERFORMED
Amphetamines: NOT DETECTED
Barbiturates: NOT DETECTED
Benzodiazepines: NOT DETECTED
Cocaine: NOT DETECTED
Opiates: NOT DETECTED
Tetrahydrocannabinol: NOT DETECTED

## 2020-11-28 LAB — ETHANOL: Alcohol, Ethyl (B): <10 mg/dL (ref <10)

## 2020-11-28 LAB — LIPASE, BLOOD: Lipase: 32 U/L (ref 11–51)

## 2020-11-28 LAB — CBG MONITORING, ED: Glucose-Capillary: 112 mg/dL — ABNORMAL HIGH (ref 70–99)

## 2020-11-28 LAB — MAGNESIUM: Magnesium: 1.9 mg/dL (ref 1.7–2.4)

## 2020-11-28 LAB — LACTIC ACID, PLASMA: Lactic Acid, Venous: 1 mmol/L (ref 0.5–1.9)

## 2020-11-28 MED ORDER — DEXAMETHASONE SODIUM PHOSPHATE 10 MG/ML IJ SOLN
10.0000 mg | Freq: Once | INTRAMUSCULAR | Status: AC
  Administered 2020-11-28: 10 mg via INTRAVENOUS
  Filled 2020-11-28: qty 1

## 2020-11-28 MED ORDER — VANCOMYCIN HCL 1750 MG/350ML IV SOLN: 1750.0000 mg | Freq: Once | INTRAVENOUS | Status: DC

## 2020-11-28 MED ORDER — SODIUM CHLORIDE 0.9 % IV SOLN: INTRAVENOUS | Status: DC

## 2020-11-28 MED ORDER — METRONIDAZOLE 500 MG/100ML IV SOLN
500.0000 mg | Freq: Three times a day (TID) | INTRAVENOUS | Status: DC
  Administered 2020-11-28: 500 mg via INTRAVENOUS
  Filled 2020-11-28: qty 100

## 2020-11-28 MED ORDER — DOCUSATE SODIUM 100 MG PO CAPS: 100.0000 mg | ORAL_CAPSULE | Freq: Two times a day (BID) | ORAL | Status: DC | PRN

## 2020-11-28 MED ORDER — LEVOFLOXACIN IN D5W 750 MG/150ML IV SOLN
750.0000 mg | Freq: Once | INTRAVENOUS | Status: AC
  Administered 2020-11-28: 750 mg via INTRAVENOUS
  Filled 2020-11-28: qty 150

## 2020-11-28 MED ORDER — VANCOMYCIN HCL 1750 MG/350ML IV SOLN
1750.0000 mg | INTRAVENOUS | Status: DC
  Administered 2020-11-28: 1750 mg via INTRAVENOUS
  Filled 2020-11-28: qty 350

## 2020-11-28 MED ORDER — SODIUM CHLORIDE 0.9 % IV SOLN
2000.0000 mg | Freq: Once | INTRAVENOUS | Status: DC
  Filled 2020-11-28: qty 20

## 2020-11-28 MED ORDER — LORAZEPAM 2 MG/ML IJ SOLN: 1.0000 mg | Freq: Once | INTRAMUSCULAR | Status: DC

## 2020-11-28 MED ORDER — LORAZEPAM 2 MG/ML IJ SOLN
1.0000 mg | Freq: Once | INTRAMUSCULAR | Status: AC
  Administered 2020-11-28: 1 mg via INTRAVENOUS

## 2020-11-28 MED ORDER — LORAZEPAM 2 MG/ML IJ SOLN: 2.0000 mg | Freq: Once | INTRAMUSCULAR | Status: AC

## 2020-11-28 MED ORDER — LORAZEPAM 2 MG/ML IJ SOLN
INTRAMUSCULAR | Status: AC
  Administered 2020-11-28: 2 mg via INTRAVENOUS
  Filled 2020-11-28: qty 1

## 2020-11-28 MED ORDER — LORAZEPAM 2 MG/ML IJ SOLN
INTRAMUSCULAR | Status: AC
  Filled 2020-11-28: qty 1

## 2020-11-28 MED ORDER — SODIUM CHLORIDE 0.9 % IV SOLN
4500.0000 mg | Freq: Once | INTRAVENOUS | Status: AC
  Administered 2020-11-28: 4500 mg via INTRAVENOUS
  Filled 2020-11-28: qty 45

## 2020-11-28 MED ORDER — POLYETHYLENE GLYCOL 3350 17 G PO PACK: 17.0000 g | PACK | Freq: Every day | ORAL | Status: DC | PRN

## 2020-11-28 NOTE — ED Notes (Signed)
Both sets of blood cultures collected before antibiotics were given.

## 2020-11-28 NOTE — ED Notes (Signed)
Pt gone to CT 

## 2020-11-28 NOTE — ED Triage Notes (Addendum)
Pt brought in by [facility] from home with c/o sudden weakness. Pt was cooking dinner and started to feel weak so she sat down. When EMS arrived, pt c/o left sided numbness and tingling x 45 minutes. Numbness and tingling has now resolved upon arrival to ED but she is reporting new onset jerking. EMS reports pt's O2 sat 85% on RA originally. Pt has a trach but does not normally wear oxygen. EMS had to suction pt several times and reports large amount of mucus coming out. O2 sat 95% with NRB over trach. Pt reports she had Covid 2 weeks ago and has had an infection in her throat since then.

## 2020-11-28 NOTE — Progress Notes (Signed)
Pharmacy Antibiotic Note  Veronica Horton is a 60 y.o. female admitted on 11/28/2020 with pneumonia.  Pharmacy has been consulted for Vancomycin dosing.  Plan: Vancomycin 1750 mg IV every 48 hours. Levaquin 750 mg IV x 1 dose. F/U continuation. Monitor labs, c/s, and vanco level as indicated.  Height: 5\' 6"  (167.6 cm) Weight: 96.2 kg (212 lb) IBW/kg (Calculated) : 59.3  Temp (24hrs), Avg:98.7 F (37.1 C), Min:98.7 F (37.1 C), Max:98.7 F (37.1 C)  Recent Labs  Lab 11/28/20 1744  WBC 4.5  CREATININE 2.06*    Estimated Creatinine Clearance: 34 mL/min (A) (by C-G formula based on SCr of 2.06 mg/dL (H)).    Allergies  Allergen Reactions   Codeine Hives    Reports itching only per RN   Penicillins Hives    Did it involve swelling of the face/tongue/throat, SOB, or low BP? No Did it involve sudden or severe rash/hives, skin peeling, or any reaction on the inside of your mouth or nose? Yes Did you need to seek medical attention at a hospital or doctor's office? Unknown When did it last happen?      Over 10 years If all above answers are "NO", may proceed with cephalosporin use.     Antimicrobials this admission: Vanco 7/31 >>  Levaquin 7/31    Microbiology results: 7/31 BCx: pending  7/31 MRSA PCR: pending  Thank you for allowing pharmacy to be a part of this patient's care.  Ramond Craver 11/28/2020 8:29 PM

## 2020-11-28 NOTE — ED Notes (Signed)
Husband called RN in room stating pt is jerking more all over than focused to left side; Dr. Rogene Houston in room and assessed pt, orders given and carried out

## 2020-11-29 ENCOUNTER — Inpatient Hospital Stay (HOSPITAL_COMMUNITY): Payer: Medicaid Other

## 2020-11-29 DIAGNOSIS — J9601 Acute respiratory failure with hypoxia: Secondary | ICD-10-CM

## 2020-11-29 DIAGNOSIS — C329 Malignant neoplasm of larynx, unspecified: Secondary | ICD-10-CM

## 2020-11-29 DIAGNOSIS — R569 Unspecified convulsions: Secondary | ICD-10-CM | POA: Diagnosis not present

## 2020-11-29 LAB — POCT I-STAT 7, (LYTES, BLD GAS, ICA,H+H)
Acid-Base Excess: 6 mmol/L — ABNORMAL HIGH (ref 0.0–2.0)
Bicarbonate: 30.7 mmol/L — ABNORMAL HIGH (ref 20.0–28.0)
Calcium, Ion: 1.19 mmol/L (ref 1.15–1.40)
HCT: 31 % — ABNORMAL LOW (ref 36.0–46.0)
Hemoglobin: 10.5 g/dL — ABNORMAL LOW (ref 12.0–15.0)
O2 Saturation: 93 %
Patient temperature: 97.7
Potassium: 3.7 mmol/L (ref 3.5–5.1)
Sodium: 138 mmol/L (ref 135–145)
TCO2: 32 mmol/L (ref 22–32)
pCO2 arterial: 43.7 mmHg (ref 32.0–48.0)
pH, Arterial: 7.453 — ABNORMAL HIGH (ref 7.350–7.450)
pO2, Arterial: 64 mmHg — ABNORMAL LOW (ref 83.0–108.0)

## 2020-11-29 LAB — HEMOGLOBIN A1C
Hgb A1c MFr Bld: 5.5 % (ref 4.8–5.6)
Mean Plasma Glucose: 111.15 mg/dL

## 2020-11-29 LAB — BLOOD GAS, ARTERIAL
Acid-Base Excess: 6.3 mmol/L — ABNORMAL HIGH (ref 0.0–2.0)
Bicarbonate: 30.3 mmol/L — ABNORMAL HIGH (ref 20.0–28.0)
FIO2: 60
O2 Saturation: 95 %
Patient temperature: 37
pCO2 arterial: 36.5 mmHg (ref 32.0–48.0)
pH, Arterial: 7.518 — ABNORMAL HIGH (ref 7.350–7.450)
pO2, Arterial: 68.3 mmHg — ABNORMAL LOW (ref 83.0–108.0)

## 2020-11-29 LAB — GLUCOSE, CAPILLARY
Glucose-Capillary: 103 mg/dL — ABNORMAL HIGH (ref 70–99)
Glucose-Capillary: 122 mg/dL — ABNORMAL HIGH (ref 70–99)
Glucose-Capillary: 148 mg/dL — ABNORMAL HIGH (ref 70–99)
Glucose-Capillary: 72 mg/dL (ref 70–99)
Glucose-Capillary: 79 mg/dL (ref 70–99)

## 2020-11-29 LAB — CREATININE, SERUM
Creatinine, Ser: 2.17 mg/dL — ABNORMAL HIGH (ref 0.44–1.00)
GFR, Estimated: 25 mL/min — ABNORMAL LOW (ref >60)

## 2020-11-29 LAB — MRSA NEXT GEN BY PCR, NASAL: MRSA by PCR Next Gen: DETECTED — AB

## 2020-11-29 LAB — MAGNESIUM: Magnesium: 1.9 mg/dL (ref 1.7–2.4)

## 2020-11-29 LAB — PROCALCITONIN: Procalcitonin: <0.1 ng/mL

## 2020-11-29 LAB — PHOSPHORUS: Phosphorus: 3.4 mg/dL (ref 2.5–4.6)

## 2020-11-29 MED ORDER — INSULIN ASPART 100 UNIT/ML IJ SOLN: 0.0000 [IU] | Freq: Every day | INTRAMUSCULAR | Status: DC

## 2020-11-29 MED ORDER — IPRATROPIUM-ALBUTEROL 0.5-2.5 (3) MG/3ML IN SOLN
3.0000 mL | Freq: Four times a day (QID) | RESPIRATORY_TRACT | Status: DC | PRN
  Filled 2020-11-29: qty 3

## 2020-11-29 MED ORDER — LEVETIRACETAM IN NACL 1000 MG/100ML IV SOLN
1000.0000 mg | Freq: Two times a day (BID) | INTRAVENOUS | Status: DC
  Administered 2020-11-29 – 2020-12-03 (×9): 1000 mg via INTRAVENOUS
  Filled 2020-11-29 (×9): qty 100

## 2020-11-29 MED ORDER — INSULIN ASPART 100 UNIT/ML IJ SOLN
0.0000 [IU] | INTRAMUSCULAR | Status: DC
  Administered 2020-11-29 – 2020-12-01 (×4): 1 [IU] via SUBCUTANEOUS
  Administered 2020-12-01: 2 [IU] via SUBCUTANEOUS
  Administered 2020-12-01: 1 [IU] via SUBCUTANEOUS
  Administered 2020-12-02 (×2): 2 [IU] via SUBCUTANEOUS
  Administered 2020-12-02 – 2020-12-03 (×5): 1 [IU] via SUBCUTANEOUS
  Administered 2020-12-03: 2 [IU] via SUBCUTANEOUS
  Administered 2020-12-03 – 2020-12-10 (×17): 1 [IU] via SUBCUTANEOUS
  Administered 2020-12-10 (×2): 2 [IU] via SUBCUTANEOUS
  Administered 2020-12-11 (×3): 1 [IU] via SUBCUTANEOUS
  Administered 2020-12-12: 2 [IU] via SUBCUTANEOUS

## 2020-11-29 MED ORDER — INSULIN ASPART 100 UNIT/ML IJ SOLN: 0.0000 [IU] | Freq: Three times a day (TID) | INTRAMUSCULAR | Status: DC

## 2020-11-29 MED ORDER — LORAZEPAM 2 MG/ML IJ SOLN
2.0000 mg | INTRAMUSCULAR | Status: DC | PRN
  Filled 2020-11-29: qty 1

## 2020-11-29 MED ORDER — PROPOFOL 1000 MG/100ML IV EMUL
5.0000 ug/kg/min | INTRAVENOUS | Status: DC
Start: 2020-11-29 — End: 2020-12-01
  Administered 2020-11-29: 5 ug/kg/min via INTRAVENOUS
  Administered 2020-11-30: 70 ug/kg/min via INTRAVENOUS
  Filled 2020-11-29: qty 200
  Filled 2020-11-29: qty 100

## 2020-11-29 MED ORDER — DOCUSATE SODIUM 50 MG/5ML PO LIQD
100.0000 mg | Freq: Two times a day (BID) | ORAL | Status: DC | PRN
  Administered 2020-12-04 – 2020-12-05 (×2): 100 mg
  Filled 2020-11-29 (×2): qty 10

## 2020-11-29 MED ORDER — JEVITY 1.5 CAL/FIBER PO LIQD
1000.0000 mL | ORAL | Status: DC
  Administered 2020-11-29 – 2020-11-30 (×2): 1000 mL
  Filled 2020-11-29 (×3): qty 1000

## 2020-11-29 MED ORDER — MIDAZOLAM HCL 2 MG/2ML IJ SOLN: 1.0000 mg | INTRAMUSCULAR | Status: DC | PRN

## 2020-11-29 MED ORDER — LORAZEPAM 2 MG/ML IJ SOLN
2.0000 mg | Freq: Once | INTRAMUSCULAR | Status: AC | PRN
  Administered 2020-11-29: 2 mg via INTRAVENOUS
  Filled 2020-11-29: qty 1

## 2020-11-29 MED ORDER — VITAL HIGH PROTEIN PO LIQD: 1000.0000 mL | ORAL | Status: DC

## 2020-11-29 MED ORDER — MUPIROCIN 2 % EX OINT
1.0000 "application " | TOPICAL_OINTMENT | Freq: Two times a day (BID) | CUTANEOUS | Status: AC
  Administered 2020-11-29 – 2020-12-03 (×10): 1 via NASAL
  Filled 2020-11-29 (×2): qty 22

## 2020-11-29 MED ORDER — MIDODRINE HCL 5 MG PO TABS: 5.0000 mg | ORAL_TABLET | Freq: Two times a day (BID) | ORAL | Status: DC

## 2020-11-29 MED ORDER — IPRATROPIUM-ALBUTEROL 0.5-2.5 (3) MG/3ML IN SOLN
3.0000 mL | RESPIRATORY_TRACT | Status: DC
  Administered 2020-11-29 – 2020-12-01 (×12): 3 mL via RESPIRATORY_TRACT
  Filled 2020-11-29 (×7): qty 3
  Filled 2020-11-29: qty 6
  Filled 2020-11-29 (×2): qty 3

## 2020-11-29 MED ORDER — BUDESONIDE 0.5 MG/2ML IN SUSP
0.5000 mg | Freq: Two times a day (BID) | RESPIRATORY_TRACT | Status: DC
  Administered 2020-11-29 – 2020-12-13 (×29): 0.5 mg via RESPIRATORY_TRACT
  Filled 2020-11-29 (×29): qty 2

## 2020-11-29 MED ORDER — ARFORMOTEROL TARTRATE 15 MCG/2ML IN NEBU
15.0000 ug | INHALATION_SOLUTION | Freq: Two times a day (BID) | RESPIRATORY_TRACT | Status: DC
  Administered 2020-11-29 – 2020-12-13 (×29): 15 ug via RESPIRATORY_TRACT
  Filled 2020-11-29 (×30): qty 2

## 2020-11-29 MED ORDER — MIDODRINE HCL 5 MG PO TABS
5.0000 mg | ORAL_TABLET | Freq: Two times a day (BID) | ORAL | Status: DC
  Administered 2020-11-29 – 2020-11-30 (×3): 5 mg
  Filled 2020-11-29 (×3): qty 1

## 2020-11-29 MED ORDER — HYDROCODONE-ACETAMINOPHEN 7.5-325 MG PO TABS
1.0000 | ORAL_TABLET | Freq: Four times a day (QID) | ORAL | Status: DC | PRN
Start: 2020-11-29 — End: 2020-11-29

## 2020-11-29 MED ORDER — HYDROCODONE-ACETAMINOPHEN 7.5-325 MG PO TABS
1.0000 | ORAL_TABLET | Freq: Four times a day (QID) | ORAL | Status: DC | PRN
  Administered 2020-11-30: 1
  Filled 2020-11-29: qty 1

## 2020-11-29 MED ORDER — SODIUM CHLORIDE 0.9 % IV SOLN
1.0000 g | INTRAVENOUS | Status: DC
  Administered 2020-11-29: 1 g via INTRAVENOUS
  Filled 2020-11-29: qty 1

## 2020-11-29 MED ORDER — PROSOURCE TF PO LIQD
90.0000 mL | Freq: Four times a day (QID) | ORAL | Status: DC
  Administered 2020-11-29 – 2020-12-01 (×6): 90 mL
  Filled 2020-11-29 (×7): qty 90

## 2020-11-29 MED ORDER — ESCITALOPRAM OXALATE 10 MG PO TABS
5.0000 mg | ORAL_TABLET | Freq: Every day | ORAL | Status: DC
  Administered 2020-11-29 – 2020-12-13 (×15): 5 mg
  Filled 2020-11-29 (×15): qty 1

## 2020-11-29 MED ORDER — ESCITALOPRAM OXALATE 10 MG PO TABS: 5.0000 mg | ORAL_TABLET | Freq: Every day | ORAL | Status: DC

## 2020-11-29 MED ORDER — FUROSEMIDE 40 MG PO TABS: 40.0000 mg | ORAL_TABLET | Freq: Every day | ORAL | Status: DC

## 2020-11-29 MED ORDER — CHLORHEXIDINE GLUCONATE CLOTH 2 % EX PADS: 6.0000 | MEDICATED_PAD | Freq: Every day | CUTANEOUS | Status: DC

## 2020-11-29 MED ORDER — PHENYLEPHRINE HCL-NACL 10-0.9 MG/250ML-% IV SOLN
25.0000 ug/min | INTRAVENOUS | Status: DC
  Administered 2020-11-29: 25 ug/min via INTRAVENOUS
  Filled 2020-11-29 (×2): qty 250

## 2020-11-29 MED ORDER — SODIUM CHLORIDE 0.9 % IV BOLUS
250.0000 mL | Freq: Once | INTRAVENOUS | Status: AC
  Administered 2020-11-29: 250 mL via INTRAVENOUS

## 2020-11-29 MED ORDER — LACTATED RINGERS IV BOLUS
1000.0000 mL | Freq: Once | INTRAVENOUS | Status: AC
  Administered 2020-11-29: 1000 mL via INTRAVENOUS

## 2020-11-29 MED ORDER — LORAZEPAM 2 MG/ML IJ SOLN
2.0000 mg | Freq: Once | INTRAMUSCULAR | Status: AC
  Administered 2020-11-29: 2 mg via INTRAVENOUS

## 2020-11-29 MED ORDER — PROSOURCE TF PO LIQD: 45.0000 mL | Freq: Two times a day (BID) | ORAL | Status: DC

## 2020-11-29 MED ORDER — PROPOFOL 10 MG/ML IV BOLUS
30.0000 mg | Freq: Once | INTRAVENOUS | Status: AC
  Administered 2020-11-29: 30 mg via INTRAVENOUS

## 2020-11-29 MED ORDER — CHLORHEXIDINE GLUCONATE CLOTH 2 % EX PADS
6.0000 | MEDICATED_PAD | Freq: Every day | CUTANEOUS | Status: AC
  Administered 2020-11-29 – 2020-12-03 (×5): 6 via TOPICAL

## 2020-11-29 MED ORDER — SODIUM CHLORIDE 0.9 % IV SOLN: 250.0000 mL | INTRAVENOUS | Status: DC

## 2020-11-29 MED ORDER — DEXAMETHASONE SODIUM PHOSPHATE 4 MG/ML IJ SOLN
4.0000 mg | Freq: Four times a day (QID) | INTRAMUSCULAR | Status: DC
  Administered 2020-11-29 – 2020-12-03 (×16): 4 mg via INTRAVENOUS
  Filled 2020-11-29 (×16): qty 1

## 2020-11-29 MED ORDER — PANTOPRAZOLE SODIUM 40 MG PO PACK
40.0000 mg | PACK | Freq: Every day | ORAL | Status: DC
  Administered 2020-11-29: 40 mg
  Filled 2020-11-29: qty 20

## 2020-11-29 MED ORDER — POLYETHYLENE GLYCOL 3350 17 G PO PACK
17.0000 g | PACK | Freq: Every day | ORAL | Status: DC | PRN
  Administered 2020-12-05: 17 g
  Filled 2020-11-29: qty 1

## 2020-11-29 MED ORDER — CHLORHEXIDINE GLUCONATE 0.12% ORAL RINSE (MEDLINE KIT)
15.0000 mL | Freq: Two times a day (BID) | OROMUCOSAL | Status: DC
  Administered 2020-11-29 – 2020-12-13 (×25): 15 mL via OROMUCOSAL

## 2020-11-29 MED ORDER — ORAL CARE MOUTH RINSE
15.0000 mL | OROMUCOSAL | Status: DC
  Administered 2020-11-29 – 2020-12-13 (×115): 15 mL via OROMUCOSAL

## 2020-11-29 MED ORDER — SODIUM CHLORIDE 0.9 % IV SOLN
500.0000 mg | INTRAVENOUS | Status: DC
  Filled 2020-11-29: qty 500

## 2020-11-29 MED ORDER — PROPOFOL 10 MG/ML IV BOLUS
50.0000 mg | Freq: Once | INTRAVENOUS | Status: DC
  Filled 2020-11-29: qty 20

## 2020-11-29 NOTE — Progress Notes (Signed)
SBP 80s, MAP >65, E-link aware, 250cc bolus given and midodrine ordered to be given now per Dr. Genevive Bi.

## 2020-11-29 NOTE — Progress Notes (Signed)
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 Hooked to vent as seemed to have prolonged post ictal state. Head CT with right parietal lobe mass with vasogenic edema which was present since 03/2020 for which she was supposed to see neurosurgery but was unable to due to insurance/financial reasons.  Called to bedside as had another focal seizure episode. Patient easily arousable and appropriate  eICU Interventions  Versed prn for seizures ordered Already on Keppra, Decadron. Will need to be seen by neurosurgery     Intervention Category Major Interventions: Respiratory failure - evaluation and management;Seizures - evaluation and management Evaluation Type: New Patient Evaluation  Judd Lien 11/29/2020, 3:52 AM

## 2020-11-29 NOTE — Progress Notes (Signed)
RT placed patient on 40% 10L ATC. Patient tolerating well at this time and RN aware. RT will continue to monitor.

## 2020-11-29 NOTE — Progress Notes (Signed)
RN called RT to MRI to place patient back on vent due to low RR after giving medication. RT placed patient on MRI vent to continue with scan. Patient tolerating well. RT will continue to monitor.

## 2020-11-29 NOTE — Progress Notes (Signed)
RT transported patient from MRI to 4N25 with RN. No complications and vital signs stable. RT will continue to monitor.

## 2020-11-29 NOTE — H&P (Addendum)
NAME:  DENE LANDSBERG, MRN:  [id number], DOB:  29-Apr-1961, LOS: 1 ADMISSION DATE:  11/28/2020, CONSULTATION DATE:  11/29/20 REFERRING MD:  EDP, CHIEF COMPLAINT:  Weakness   History of Present Illness:  Lamisha Roussell is a 60 y.o. 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  She was given Ativan for this and was noted to be more somnolent afterwards requiring ventilator support.    Of note, she was admitted with aspiration PNA 03/2020 and head CT at that time showed extensive vasogenic edema R parietal/occipital region with concern for meningioma.  Pt refused an MRI at that time, and was to follow with neurosurgery outpatient.  She states that she did not follow up for insurance reasons.    In the ED, head CT with redemonstrated mass in the right parietal lobe with extensive vasogenic edema throughout the right parietal lobe.  Labs were significant for acute on chronic renal insufficiency, glucose normal, no severe electrolyte abnormalities.  She was loaded with Keppra and transferred to [facility].  On arrival she is awake and alert and hemodynamically stable, mouthing answers to questions and interactive.  Pertinent  Medical History   has a past medical history of Bronchitis, Class 3 obesity (01/23/2020), COPD (chronic obstructive pulmonary disease) ([place]), Degenerative disc disease, lumbar, Hiatal hernia, Hypertension, and Throat cancer ([place]).   Significant Hospital Events: Including procedures, antibiotic start and stop dates in addition to other pertinent events   7/31 presented to [facility] ED, possible seizure, transfer requested to [facility] Given one dose Vanc/Flagyl, started Ceftriaxone and Azithromycin  Interim History / Subjective:  Pt hemodynamically stable throughout transfer  Objective    Blood pressure 139/85, pulse 77, temperature 98.7 F (37.1 C), temperature source Oral, resp. rate 18, height 5\' 6"  (1.676 m), weight 96.2 kg, SpO2 (!) 73 %.    Vent Mode: PRVC FiO2 (%):  [60 %-70 %] 70 % Set Rate:  [16 bmp] 16 bmp Vt Set:  [470 VC-944967 mL] 470470 mL PEEP:  [5 cmH20] 5 cmH20 Plateau Pressure:  [15 cmH20] 15 cmH20   Intake/Output Summary (Last 24 hours) at 11/29/2020 0047 Last data filed at 11/28/2020 2204 Gross per 24 hour  Intake 397.17 ml  Output --  Net 397.17 ml   Filed Weights   11/28/20 1558  Weight: 96.2 kg    General: Obese, chronically ill-appearing female with trach in place in no acute distress HEENT: MM pink/moist, tracheostomy with vent support, pupils equal, sclera anicteric, Neuro: Awake, mouthing appropriate answers to questions, moving lower extremities equally, difficulty moving the left upper extremity with 3 out of 5 strength CV: s1s2 rrr, no m/r/g PULM: On full vent support, PEEP of 5 and 60% FiO2 with oxygen saturations 100%, decreased air movement bilateral bases without significant rhonchi or wheezing, no retractions or tachypnea. GI: soft, bsx4 active  Extremities: warm/dry, 1+ edema  Skin: no rashes or lesions  Resolved Hospital Problem list     Assessment & Plan:    Acute on Chronic Hypoxic Respiratory Failure Likely secondary to post-ictal respiratory depression and  Patchy left lower lobe consolidation on CXR.  No lactic acidosis or leukocytosis P: -Patient requiring minimal vent support and is now fully awake, suspect we will be able to wean off the vent quickly unless there is further seizure activity or  signs of worsening pneumonia -Check sputum culture and procalcitonin, CAP coverage with Rocephin and Azithromycin for now   R Parietal Mass with extensive vasogenic edema Discovered 6 months ago, patient states she did not follow-up with neurosurgery secondary to insurance reasons   Seizure activity Given Ativan and  loaded with Keppra, awake and alert after transfer to [facility], likely secondary to possible meningioma and substantial vasogenic edema P: -Will need neurology and neurosurgery evaluations -Continue seizure precautions   Squamous Cell Carcinoma of the supraglottis, stage IV -follows with Dr. Irene Limbo radiation completed 05/31/20 along with 4 cycles of cisplatin -repeat PET 09/2020 with decreased size of supraglottic mass with marked hypermetabolic activity consistent with residual disease, to follow up in November  History of DVT Right popliteal DVT Dx 05/25/20, per notes was not discharged on blood thinner secondary to vasogenic edema P:  -On Eliquis, will hold in the setting of vasogenic edema and mass  Acute on Chronic Renal Failure  Creatinine bump from baseline 1.3-1.7 to 2 P: -follow renal indices, UOP and electrolytes and avoid nephrotoxins  COPD Exam not currently consistent with acute flare P: -continue home Pulmicort and Brovana with prn duonebs  Best Practice (right click and "Reselect all SmartList Selections" daily)   Diet/type: tubefeeds and NPO DVT prophylaxis: SCD GI prophylaxis: N/A Lines: N/A Foley:  N/A Code Status:  full code Last date of multidisciplinary goals of care discussion [pending]  Labs   CBC: Recent Labs  Lab 11/28/20 1744  WBC 4.5  NEUTROABS 3.2  HGB 11.0*  HCT 33.5*  MCV 99.4  PLT 161    Basic Metabolic Panel: Recent Labs  Lab 11/28/20 1744  NA 138  K 3.6  CL 96*  CO2 27  GLUCOSE 102*  BUN 14  CREATININE 2.06*  CALCIUM 9.5  MG 1.9   GFR: Estimated Creatinine Clearance: 34 mL/min (A) (by C-G formula based on SCr of 2.06 mg/dL (H)). Recent Labs  Lab 11/28/20 1744 11/28/20 2029  WBC 4.5  --   LATICACIDVEN  --  1.0    Liver Function Tests: Recent Labs  Lab 11/28/20 1744  AST 36  ALT 16  ALKPHOS 44  BILITOT 1.2  PROT 7.3  ALBUMIN 3.7   Recent Labs  Lab 11/28/20 1744  LIPASE 32   No results for input(s):  AMMONIA in the last 168 hours.  ABG    Component Value Date/Time   PHART 7.385 11/28/2020 2030   PCO2ART 55.7 (H) 11/28/2020 2030   PO2ART 98.5 11/28/2020 2030   HCO3 30.3 (H) 11/28/2020 2030   O2SAT 96.6 11/28/2020 2030     Coagulation Profile: No results for input(s): INR, PROTIME in the last 168 hours.  Cardiac Enzymes: No results for input(s): CKTOTAL, CKMB, CKMBINDEX, TROPONINI in the last 168 hours.  HbA1C: Hgb A1c MFr Bld  Date/Time Value Ref Range Status  03/15/2020 04:21 PM 5.4 4.8 - 5.6 % Final    Comment:    (NOTE) Pre diabetes:          5.7%-6.4%  Diabetes:              >6.4%  Glycemic control for   <7.0% adults with diabetes     CBG: Recent Labs  Lab 11/28/20 1814  GLUCAP 112*    Review of Systems:   Unable to obtain secondary to trach and vent  Past Medical History:  She,  has a past medical history of Bronchitis, Class 3 obesity (01/23/2020), COPD (chronic obstructive pulmonary disease) ([place]),  Degenerative disc disease, lumbar, Hiatal hernia, Hypertension, and Throat cancer ([place]).   Surgical History:   Past Surgical History:  Procedure Laterality Date   BIOPSY  04/12/2020   Procedure: BIOPSY;  Surgeon: Wilford Corner, MD;  Location: [facility];  Service: Gastroenterology;;   CHOLECYSTECTOMY     degenerative bone disease     DIRECT LARYNGOSCOPY N/A 03/13/2020   Procedure: DIRECT LARYNGOSCOPY WITH BIOPSY;  Surgeon: Jason Coop, DO;  Location: [facility];  Service: ENT;  Laterality: N/A;   FLEXIBLE SIGMOIDOSCOPY N/A 04/12/2020   Procedure: FLEXIBLE SIGMOIDOSCOPY;  Surgeon: Wilford Corner, MD;  Location: [facility];  Service: Gastroenterology;  Laterality: N/A;   INCISIONAL HERNIA REPAIR N/A 01/15/2019   Procedure: [name] HERNIORRHAPHY WITH MESH;  Surgeon: [name], MD;  Location: [facility];  Service: General;  Laterality: N/A;   IR GASTROSTOMY TUBE MOD SED  03/22/2020   IR [place] TUBE CHANGE  07/24/2020   IR RADIOLOGIST EVAL &  MGMT  05/07/2020   OMENTECTOMY N/A 01/15/2019   Procedure: OMENTECTOMY;  Surgeon: [name], MD;  Location: [facility];  Service: General;  Laterality: N/A;   POLYPECTOMY  04/12/2020   Procedure: POLYPECTOMY;  Surgeon: Wilford Corner, MD;  Location: [facility];  Service: Gastroenterology;;   TRACHEOSTOMY TUBE PLACEMENT N/A 03/13/2020   Procedure: AWAKE TRACHEOSTOMY;  Surgeon: Jason Coop, DO;  Location: [facility];  Service: ENT;  Laterality: N/A;     Social History:   reports that she has been smoking cigarettes. She has been smoking an average of 1 pack per day. She has never used smokeless tobacco. She reports that she does not drink alcohol and does not use drugs.   Family History:  Her family history includes Hypertension in her mother. There is no history of Sudden Cardiac Death.   Allergies Allergies  Allergen Reactions   Codeine Hives    Reports itching only per RN   Penicillins Hives    Did it involve swelling of the face/tongue/throat, SOB, or low BP? No Did it involve sudden or severe rash/hives, skin peeling, or any reaction on the inside of your mouth or nose? Yes Did you need to seek medical attention at a hospital or doctor's office? Unknown When did it last happen?      Over 10 years If all above answers are "NO", may proceed with cephalosporin use.      Home Medications  Prior to Admission medications   Medication Sig Start Date End Date Taking? Authorizing Provider  Albuterol Sulfate (PROAIR RESPICLICK) 175 (90 Base) MCG/ACT AEPB See admin instructions.    [provider]  apixaban (ELIQUIS) 5 MG TABS tablet Take 1 tablet (5 mg total) by mouth 2 (two) times daily. 07/02/20   Benay Pike, MD  arformoterol (BROVANA) 15 MCG/2ML NEBU Take 2 mLs (15 mcg total) by nebulization 2 (two) times daily. 04/16/20   Pokhrel, Corrie Mckusick, MD  azithromycin (ZITHROMAX) 250 MG tablet Take as instructed. 08/09/20   Benay Pike, MD  bisacodyl (DULCOLAX) 10 MG  suppository Place 1 suppository (10 mg total) rectally daily as needed for severe constipation. 04/16/20   Pokhrel, Laxman, MD  budesonide (PULMICORT) 0.5 MG/2ML nebulizer solution Take 2 mLs (0.5 mg total) by nebulization 2 (two) times daily. 04/16/20   Pokhrel, Corrie Mckusick, MD  dexamethasone (DECADRON) 2 MG tablet Take 2 tablets (4 mg total) by mouth 2 (two) times daily. 04/30/20   Georgette Shell, MD  diclofenac Sodium (VOLTAREN) 1 % GEL Apply 2 g topically 4 (four) times  daily as needed (neck/back pain). 04/16/20   Pokhrel, Corrie Mckusick, MD  diphenhydrAMINE (BENADRYL) 12.5 MG/5ML elixir Place 5 mLs (12.5 mg total) into feeding tube every 6 (six) hours as needed for itching. 04/16/20   Pokhrel, Corrie Mckusick, MD  diphenoxylate-atropine (LOMOTIL) 2.5-0.025 MG tablet Take 1 tablet by mouth 4 (four) times daily as needed for diarrhea or loose stools. 05/05/20 05/05/21  Erick Colace, NP  escitalopram (LEXAPRO) 5 MG tablet Take 1 tablet (5 mg total) by mouth daily. 05/01/20   Georgette Shell, MD  feeding supplement, ENSURE ENLIVE, (ENSURE ENLIVE) LIQD Take 237 mLs by mouth 2 (two) times daily between meals. 01/25/20   Manuella Ghazi, Pratik D, DO  folic acid (FOLVITE) 1 MG tablet Take 1 tablet (1 mg total) by mouth daily. 01/31/19   [name], MD  furosemide (LASIX) 40 MG tablet Take 40 mg by mouth daily.    [provider]  gabapentin (NEURONTIN) 300 MG capsule Take 2 capsules (600 mg total) by mouth at bedtime. 04/16/20   Pokhrel, Corrie Mckusick, MD  guaiFENesin-dextromethorphan (ROBITUSSIN DM) 100-10 MG/5ML syrup Take 10 mLs by mouth every 4 (four) hours as needed for cough. 06/11/20   Benay Pike, MD  hydrOXYzine (ATARAX/VISTARIL) 10 MG tablet Place 1 tablet (10 mg total) into feeding tube 3 (three) times daily as needed for anxiety. 04/16/20   Pokhrel, Laxman, MD  ipratropium-albuterol (DUONEB) 0.5-2.5 (3) MG/3ML SOLN Take 3 mLs by nebulization every 6 (six) hours as needed. 04/16/20 05/16/20  Pokhrel, Corrie Mckusick, MD   ketotifen (ZADITOR) 0.025 % ophthalmic solution Place 1 drop into both eyes 2 (two) times daily. 04/16/20   Pokhrel, Laxman, MD  lidocaine (LIDODERM) 5 % Place 1 patch onto the skin daily. Remove & Discard patch within 12 hours or as directed by MD 08/09/20   Benay Pike, MD  melatonin 5 MG TABS Take 1 tablet (5 mg total) by mouth at bedtime. 04/16/20   Pokhrel, Corrie Mckusick, MD  midodrine (PROAMATINE) 5 MG tablet Take 1 tablet (5 mg total) by mouth 2 (two) times daily with a meal. 04/30/20   Georgette Shell, MD  oxyCODONE (OXY IR/ROXICODONE) 5 MG immediate release tablet Place 1 tablet (5 mg total) into feeding tube every 6 (six) hours as needed for severe pain. 04/16/20   Pokhrel, Corrie Mckusick, MD  pantoprazole sodium (PROTONIX) 40 mg/20 mL PACK Place 20 mLs (40 mg total) into feeding tube daily. 04/16/20 05/16/20  Pokhrel, Corrie Mckusick, MD  polyethylene glycol (MIRALAX / GLYCOLAX) 17 g packet Place 17 g into feeding tube 2 (two) times daily as needed for moderate constipation. 04/16/20   Pokhrel, Corrie Mckusick, MD  potassium chloride SA (KLOR-CON) 20 MEQ tablet Take 1 tablet (20 mEq total) by mouth daily. 06/16/20   Benay Pike, MD  saccharomyces boulardii (FLORASTOR) 250 MG capsule Take 1 capsule (250 mg total) by mouth 2 (two) times daily. 01/30/19   [name], MD  scopolamine (TRANSDERM-SCOP) 1 MG/3DAYS Place 1 patch (1.5 mg total) onto the skin every 3 (three) days. 04/18/20   Pokhrel, Corrie Mckusick, MD  vitamin B-12 (CYANOCOBALAMIN) 500 MCG tablet Take 1 tablet (500 mcg total) by mouth daily. 01/31/19   [name], MD  prochlorperazine (COMPAZINE) 10 MG tablet Take 1 tablet (10 mg total) by mouth every 6 (six) hours as needed (Nausea or vomiting). Patient not taking: Reported on 06/11/2020 04/15/20 06/11/20  Benay Pike, MD     Critical care time: 45 minutes    CRITICAL CARE Performed by: Otilio Carpen Abella Shugart   Total critical  care time: 45 minutes  Critical care time was exclusive of separately  billable procedures and treating other patients.  Critical care was necessary to treat or prevent imminent or life-threatening deterioration.  Critical care was time spent personally by me on the following activities: development of treatment plan with patient and/or surrogate as well as nursing, discussions with consultants, evaluation of patient's response to treatment, examination of patient, obtaining history from patient or surrogate, ordering and performing treatments and interventions, ordering and review of laboratory studies, ordering and review of radiographic studies, pulse oximetry and re-evaluation of patient's condition.  Otilio Carpen Everardo Voris, PA-C  Pulmonary & Critical care See Amion for pager If no response to pager , please call [phone] until 7pm After 7:00 pm call Elink  [phone]

## 2020-11-29 NOTE — Consult Note (Signed)
   Providing Compassionate, Quality Care - Together  Neurosurgery Consult  Referring physician: Dr. Helane Gunther Reason for referral:  extra-axial mass, seizure  Chief Complaint:  seizure  History of Present Illness:  this is a 60 year old female with a history of laryngeal squamous cell carcinoma, stage IV, status post radiation and chemotherapy, COPD, peg, trach collar, hypertension and obesity that had a generalized tonic clonic seizure and weakness on the left side and was taken to the hospital emergently yesterday to [facility].  Workup revealed a right parietal mass with significant surrounding vasogenic edema concerning for meningioma.  At this time she is on ventilation support but awake and communicating appropriately.  She denies any weakness.  She is currently on EEG. She was loaded with Keppra and Decadron at the outside hospital.  She was transferred here for further neurosurgical evaluation and workup.   Medications: I have reviewed the patient's current medications. Allergies: No Known Allergies  History reviewed. No pertinent family history. Social History:  has no history on file for tobacco use, alcohol use, and drug use.  ROS:   All positives and negatives are listed HPI above  Physical Exam:  Vital signs in last 24 hours: Temp:  [98 F (36.7 C)-98.3 F (36.8 C)] 98 F (36.7 C) (07/25 1814) Pulse Rate:  [58-128] 65 (07/26 0746) Resp:  [11-18] 14 (07/26 0217) BP: (138-182)/(65-125) 153/88 (07/26 0700) SpO2:  [91 %-98 %] 96 % (07/26 0746) PE:   Awake alert, mouthing words appropriately  PERRLA  EOMI  Poor dentition  Cranial nerves intact  Following commands x4  Moving all extremities equally, RUE/ R LE be 5/5, LUE 4+/5, LLE 5/5  Sensory intact to light touch   Impression/Assessment:   60 year old female with   Right  frontoparietal extra-axial mass with vasogenic edema  Seizures    Plan:  - continue supportive care - antiepileptics, EEG pending -  Decadron 4q6 - MRI brain with without contrast, stereo protocol for evaluation of tumor - recent PET scan shows response to treatment for laryngeal cancer.  Once imaging is completed we will discuss with the patient and her family plans for management of her intracranial mass   Thank you for allowing me to participate in this patient's care.  Please do not hesitate to call with questions or concerns.   Elwin Sleight, [facility] Cell: [phone]

## 2020-11-29 NOTE — Progress Notes (Signed)
Unsuccessful at obtaining MRI due to pt intolerance despite sedation given. MD Dawley and MD Chand both made aware. Vital signs stable.

## 2020-11-29 NOTE — Progress Notes (Signed)
[place] Progress Note Patient Name: Veronica Horton DOB: Dec 30, 1960 MRN: [id number]   Date of Service  11/29/2020  HPI/Events of Note  Multiple issues: 1. Hypotension - BP = 85/54. Needs increased sedation to tolerate MRI. 2. Request to change Novolog SSI from AC/HS to Q 4 hours. No CVL or CVP.  eICU Interventions  Plan:  Phenylephrine IV infusion via PIV. Titrate to MAP >= 65. Change to Q 4 hour sensitive Novolog SSI.     Intervention Category Major Interventions: Hypotension - evaluation and management;Hyperglycemia - active titration of insulin therapy  Lysle Dingwall 11/29/2020, 9:10 PM

## 2020-11-29 NOTE — Procedures (Signed)
Patient Name: Veronica Horton  MRN: [id number]  Epilepsy Attending: Lora Havens  Referring Physician/Provider: Elease Etienne, PA Date: 11/29/2020 Duration: 23.04 mins  Patient [id number] F with 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 EEG to evaluate for seizure  Level of alertness: Awake, asleep  AEDs during EEG study: LEV  Technical aspects: This EEG study was done with scalp electrodes positioned according to the 10-20 International system of electrode placement. Electrical activity was acquired at a sampling rate of 500Hz  and reviewed with a high frequency filter of 70Hz  and a low frequency filter of 1Hz . EEG data were recorded continuously and digitally stored.   Description: The posterior dominant rhythm consists of 8-9Hz  activity of moderate voltage (25-35 uV) seen predominantly in posterior head regions, symmetric and reactive to eye opening and eye closing. Sleep was characterized by vertex waves, sleep spindles (12 to 14 Hz), maximal frontocentral region. EEG showed intermittent generalized 3 to 6 Hz theta-delta slowing. Hyperventilation and photic stimulation were not performed.     ABNORMALITY - Intermittent slow, generalized  IMPRESSION: This study is suggestive of mild diffuse encephalopathy, nonspecific etiology. No seizures or epileptiform discharges were seen throughout the recording.  Emmaclaire Switala Barbra Sarks

## 2020-11-29 NOTE — Progress Notes (Signed)
During MRI patient became agitated and requested to be removed from MRI machine. Administered PRN medication for agitation related to MRI, upon administration patient saturation dropped to 79-83% on 15L trach collar. Provided rescue breaths to increase to 90%. RT notified and provided a ventilator and assistance. Patient connected to ventilator, saturating 91%, relaxed and agreeing to MRI.

## 2020-11-29 NOTE — Progress Notes (Signed)
Initial Nutrition Assessment  DOCUMENTATION CODES:   Obesity unspecified  INTERVENTION:   -Initiate Jevity 1.5  @ 30 ml/hr via PEG  90 ml Prosource TF QID.    Tube feeding regimen provides 1380 kcal (100% of needs), 134 grams of protein, and 548 ml of H2O.    -RD will follow for possible diet advancement (awaiting SLP evaluation) and adjust supplement as appropriate  NUTRITION DIAGNOSIS:   Inadequate oral intake related to inability to eat as evidenced by NPO status.  GOAL:   Provide needs based on ASPEN/SCCM guidelines  MONITOR:   Diet advancement, Vent status, Labs, Weight trends, TF tolerance, Skin, I & O's  REASON FOR ASSESSMENT:   Consult, Ventilator Enteral/tube feeding initiation and management  ASSESSMENT:   Veronica Horton is a 60 y.o. 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  She was given Ativan for this and was noted to be more somnolent afterwards requiring ventilator support.    Of note, she was admitted with aspiration PNA 03/2020 and head CT at that time showed extensive vasogenic edema R parietal/occipital region with concern for meningioma.  Pt refused an MRI at that time, and was to follow with neurosurgery outpatient.  She states that she did not follow up for insurance reasons.  Pt admitted with rt fronto-parietal extra-axial mass with vasogenic edema and seizures.   Patient on ventilator support via trach.  MV: 4.4 L/min Temp (24hrs), Avg:97.6 F (36.4 C), Min:96.4 F (35.8 C), Max:98.7 F (37.1 C)  Reviewed I/O's: +1.1 L x 24 hours   UOP: 150 ml x 24 hours  MAP: 83   Case discussed with RN. Pt just taken off vent (pt was later placed back on vent for additional support after RD visit). Per RN, pt's baseline is trach with 3 L trach  collar. No family at bedside, but RN reports receiving conflicting reports about pt's home nutrition regimen. Pt with PEG and will receive TF regimen (unknown name) which will provide 2500 kcals per day. She also reports that sometimes pt eats PO at home and does not receive TF.   Pt able to answer close ended questions, but kept eyes closed during visit. She reports feeling better today and denies pain.   Reviewed wt hx; pt has experienced a 13.5% wt loss over the past 6 months, which is significant for time frame.   Plan for SLP evaluation to determine pt's ability to take PO's. RD will meet 100% of pt's nutritional needs via TF until able to demonstrate significant oral intake.   Medications reviewed and include decadron and keppra.   Labs reviewed: CBGS: 79-103 (inpatient orders for glycemic control are 0-5 units insulin aspart daily at bedtime and 0-9 units insulin aspart TID with meals).    NUTRITION - FOCUSED PHYSICAL EXAM:  Flowsheet Row Most Recent Value  Orbital Region No depletion  Upper Arm Region No depletion  Thoracic and Lumbar Region No depletion  Buccal Region No depletion  Temple Region No depletion  Clavicle Bone Region No depletion  Clavicle and Acromion Bone Region No depletion  Scapular Bone Region No depletion  Dorsal Hand No depletion  Patellar Region No depletion  Anterior Thigh Region No depletion  Posterior Calf Region No depletion  Edema (RD Assessment) Moderate  Hair Reviewed  Eyes Reviewed  Mouth Reviewed  Skin Reviewed  Nails Reviewed       Diet Order:   Diet Order             Diet NPO time specified Except for: Sips with Meds  Diet effective now                   EDUCATION NEEDS:   Not appropriate for education at this time  Skin:  Skin Assessment: Reviewed RN Assessment  Last BM:  Unknown  Height:   Ht Readings from Last 1 Encounters:  11/28/20 5\' 6"  (1.676 m)    Weight:   Wt Readings from Last 1 Encounters:  11/28/20  96.2 kg    Ideal Body Weight:  59.1 kg  BMI:  Body mass index is 34.22 kg/m.  Estimated Nutritional Needs:   Kcal:  9024-0973  Protein:  > 118 grams  Fluid:  > 1 L    Loistine Chance, RD, LDN, [place] Registered Dietitian II Certified Diabetes Care and Education Specialist Please refer to [facility] for RD and/or RD on-call/weekend/after hours pager

## 2020-11-29 NOTE — Progress Notes (Signed)
Pt had twitching of L arm and head lasting about 15 seconds, E-link aware.

## 2020-11-29 NOTE — ED Provider Notes (Signed)
[facility] EMERGENCY DEPARTMENT Provider Note   CSN: [id number] Arrival date & time: 11/28/20  1552     History Chief Complaint  Patient presents with   Weakness    Veronica Horton is a 60 y.o. female.  Patient brought in from home with left-sided focal seizure activity with jerking of her left upper extremity and left lower extremity head was deviated to the left and eyes deviated to the left.  And without any known history of seizures in the past.  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  Has a trach in place.  Patient has had head CTs in the past that raise some kind of meningioma mass on the right parietal area.  Past medical history sniffing for COPD hypertension and throat cancer.  Patient technically in status epilepticus on presentation but it is a focal type seizure very things on the left side.  Patient can even talk some.  He can  raise her right arm and right leg.  Patient's husband states that she had felt like she was having trouble breathing and felt as if there was like too much mucus.  There was no history of any vomiting.      Past Medical History:  Diagnosis Date   Bronchitis    Class 3 obesity 01/23/2020   COPD (chronic obstructive pulmonary disease) (HCC)    Degenerative disc disease, lumbar    Hiatal hernia    Hypertension    Throat cancer [facility])     Patient Active Problem List   Diagnosis Date Noted   Status epilepticus ([place]) 11/28/2020   Seizure ([place]) 11/28/2020   Pressure injury of skin    Diarrhea    Chronic respiratory failure (HCC)    SOB (shortness of breath)    PNA (pneumonia) 04/24/2020   Sepsis ([place]) 04/24/2020   Abnormal findings on diagnostic imaging of abdomen 04/12/2020   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)    Acute on chronic respiratory failure with hypoxia and hypercapnia (HCC) 03/13/2020   Laryngeal mass 03/11/2020   Acute  respiratory failure with hypercapnia ([place]) 03/10/2020   Acute exacerbation of chronic obstructive pulmonary disease (COPD) ([place]) 01/24/2020   Acute respiratory failure with hypoxia and hypercapnia (HCC) 01/23/2020   Class 3 obesity 01/23/2020   Tobacco abuse 01/23/2020   Pneumonia    Aspiration pneumonitis ([place]) 01/17/2019   Incisional hernia, without obstruction or gangrene    COPD with acute exacerbation ([place]) 01/14/2019   Small bowel obstruction ([place]) 01/13/2019   COPD (chronic obstructive pulmonary disease) ([place])    Hypertension    Acute respiratory failure with hypoxia ([place])    Polycythemia     Past Surgical History:  Procedure Laterality Date   BIOPSY  04/12/2020   Procedure: BIOPSY;  Surgeon: Wilford Corner, MD;  Location: [facility];  Service: Gastroenterology;;   CHOLECYSTECTOMY     degenerative bone disease     DIRECT LARYNGOSCOPY N/A 03/13/2020   Procedure: DIRECT LARYNGOSCOPY WITH BIOPSY;  Surgeon: Jason Coop, DO;  Location: [place];  Service: ENT;  Laterality: N/A;   FLEXIBLE SIGMOIDOSCOPY N/A 04/12/2020   Procedure: Beryle Quant;  Surgeon: Wilford Corner, MD;  Location: [facility];  Service: Gastroenterology;  Laterality: N/A;   INCISIONAL HERNIA REPAIR N/A 01/15/2019   Procedure: [name] HERNIORRHAPHY WITH MESH;  Surgeon: [name], MD;  Location: [facility];  Service: General;  Laterality: N/A;  IR GASTROSTOMY TUBE MOD SED  03/22/2020   IR GJ TUBE CHANGE  07/24/2020   IR RADIOLOGIST EVAL & MGMT  05/07/2020   OMENTECTOMY N/A 01/15/2019   Procedure: OMENTECTOMY;  Surgeon: [name], MD;  Location: [facility];  Service: General;  Laterality: N/A;   POLYPECTOMY  04/12/2020   Procedure: POLYPECTOMY;  Surgeon: Wilford Corner, MD;  [facility]: [facility];  Service: Gastroenterology;;   TRACHEOSTOMY TUBE PLACEMENT N/A 03/13/2020   Procedure: AWAKE TRACHEOSTOMY;  Surgeon: Jason Coop, DO;  Location: [place];  Service: ENT;  Laterality:  N/A;     OB History   No obstetric history on file.     Family History  Problem Relation Age of Onset   Hypertension Mother    Sudden Cardiac Death Neg Hx     Social History   Tobacco Use   Smoking status: Every Day    Packs/day: 1.00    Types: Cigarettes   Smokeless tobacco: Never  Vaping Use   Vaping Use: Never used  Substance Use Topics   Alcohol use: No   Drug use: No    Home Medications Prior to Admission medications   Medication Sig Start Date End Date Taking? Authorizing Provider  Albuterol Sulfate (PROAIR RESPICLICK) 240 (90 Base) MCG/ACT AEPB See admin instructions.    [provider]  apixaban (ELIQUIS) 5 MG TABS tablet Take 1 tablet (5 mg total) by mouth 2 (two) times daily. 07/02/20   Benay Pike, MD  arformoterol (BROVANA) 15 MCG/2ML NEBU Take 2 mLs (15 mcg total) by nebulization 2 (two) times daily. 04/16/20   Pokhrel, Corrie Mckusick, MD  azithromycin (ZITHROMAX) 250 MG tablet Take as instructed. 08/09/20   Benay Pike, MD  bisacodyl (DULCOLAX) 10 MG suppository Place 1 suppository (10 mg total) rectally daily as needed for severe constipation. 04/16/20   Pokhrel, Laxman, MD  budesonide (PULMICORT) 0.5 MG/2ML nebulizer solution Take 2 mLs (0.5 mg total) by nebulization 2 (two) times daily. 04/16/20   Pokhrel, Corrie Mckusick, MD  dexamethasone (DECADRON) 2 MG tablet Take 2 tablets (4 mg total) by mouth 2 (two) times daily. 04/30/20   Georgette Shell, MD  diclofenac Sodium (VOLTAREN) 1 % GEL Apply 2 g topically 4 (four) times daily as needed (neck/back pain). 04/16/20   Pokhrel, Corrie Mckusick, MD  diphenhydrAMINE (BENADRYL) 12.5 MG/5ML elixir Place 5 mLs (12.5 mg total) into feeding tube every 6 (six) hours as needed for itching. 04/16/20   Pokhrel, Corrie Mckusick, MD  diphenoxylate-atropine (LOMOTIL) 2.5-0.025 MG tablet Take 1 tablet by mouth 4 (four) times daily as needed for diarrhea or loose stools. 05/05/20 05/05/21  Erick Colace, NP  escitalopram (LEXAPRO) 5 MG tablet  Take 1 tablet (5 mg total) by mouth daily. 05/01/20   Georgette Shell, MD  feeding supplement, ENSURE ENLIVE, (ENSURE ENLIVE) LIQD Take 237 mLs by mouth 2 (two) times daily between meals. 01/25/20   Manuella Ghazi, Pratik D, DO  folic acid (FOLVITE) 1 MG tablet Take 1 tablet (1 mg total) by mouth daily. 01/31/19   [name], MD  furosemide (LASIX) 40 MG tablet Take 40 mg by mouth daily.    [provider]  gabapentin (NEURONTIN) 300 MG capsule Take 2 capsules (600 mg total) by mouth at bedtime. 04/16/20   Pokhrel, Corrie Mckusick, MD  guaiFENesin-dextromethorphan (ROBITUSSIN DM) 100-10 MG/5ML syrup Take 10 mLs by mouth every 4 (four) hours as needed for cough. 06/11/20   Benay Pike, MD  hydrOXYzine (ATARAX/VISTARIL) 10 MG tablet Place 1 tablet (10 mg total) into  feeding tube 3 (three) times daily as needed for anxiety. 04/16/20   Pokhrel, Laxman, MD  ipratropium-albuterol (DUONEB) 0.5-2.5 (3) MG/3ML SOLN Take 3 mLs by nebulization every 6 (six) hours as needed. 04/16/20 05/16/20  Pokhrel, Corrie Mckusick, MD  ketotifen (ZADITOR) 0.025 % ophthalmic solution Place 1 drop into both eyes 2 (two) times daily. 04/16/20   Pokhrel, Laxman, MD  lidocaine (LIDODERM) 5 % Place 1 patch onto the skin daily. Remove & Discard patch within 12 hours or as directed by MD 08/09/20   Benay Pike, MD  melatonin 5 MG TABS Take 1 tablet (5 mg total) by mouth at bedtime. 04/16/20   Pokhrel, Corrie Mckusick, MD  midodrine (PROAMATINE) 5 MG tablet Take 1 tablet (5 mg total) by mouth 2 (two) times daily with a meal. 04/30/20   Georgette Shell, MD  oxyCODONE (OXY IR/ROXICODONE) 5 MG immediate release tablet Place 1 tablet (5 mg total) into feeding tube every 6 (six) hours as needed for severe pain. 04/16/20   Pokhrel, Corrie Mckusick, MD  pantoprazole sodium (PROTONIX) 40 mg/20 mL PACK Place 20 mLs (40 mg total) into feeding tube daily. 04/16/20 05/16/20  Pokhrel, Corrie Mckusick, MD  polyethylene glycol (MIRALAX / GLYCOLAX) 17 g packet Place 17 g into  feeding tube 2 (two) times daily as needed for moderate constipation. 04/16/20   Pokhrel, Corrie Mckusick, MD  potassium chloride SA (KLOR-CON) 20 MEQ tablet Take 1 tablet (20 mEq total) by mouth daily. 06/16/20   Benay Pike, MD  saccharomyces boulardii (FLORASTOR) 250 MG capsule Take 1 capsule (250 mg total) by mouth 2 (two) times daily. 01/30/19   [name], MD  scopolamine (TRANSDERM-SCOP) 1 MG/3DAYS Place 1 patch (1.5 mg total) onto the skin every 3 (three) days. 04/18/20   Pokhrel, Corrie Mckusick, MD  vitamin B-12 (CYANOCOBALAMIN) 500 MCG tablet Take 1 tablet (500 mcg total) by mouth daily. 01/31/19   [name], MD  prochlorperazine (COMPAZINE) 10 MG tablet Take 1 tablet (10 mg total) by mouth every 6 (six) hours as needed (Nausea or vomiting). Patient not taking: Reported on 06/11/2020 04/15/20 06/11/20  Benay Pike, MD    Allergies    Codeine and Penicillins  Review of Systems   Review of Systems  Unable to perform ROS: Acuity of condition  Respiratory:  Positive for shortness of breath. Negative for cough.   Gastrointestinal:  Negative for vomiting.  Skin:  Negative for color change and rash.  Neurological:  Positive for seizures.  All other systems reviewed and are negative.  Physical Exam Updated Vital Signs BP 139/85   Pulse 77   Temp 98.7 F (37.1 C) (Oral)   Resp 18   Ht 1.676 m (5\' 6" )   Wt 96.2 kg   SpO2 (!) 73%   BMI 34.22 kg/m   Physical Exam Vitals and nursing note reviewed.  Constitutional:      General: She is in acute distress.     Appearance: She is well-developed. She is toxic-appearing.  HENT:     Head: Normocephalic and atraumatic.  Eyes:     Conjunctiva/sclera: Conjunctivae normal.     Comments: Is deviated to the left.  Neck:     Comments: Trach in place. Cardiovascular:     Rate and Rhythm: Normal rate and regular rhythm.     Heart sounds: No murmur heard. Pulmonary:     Effort: Pulmonary effort is normal. No respiratory distress.     Breath  sounds: Normal breath sounds.  Abdominal:     Palpations: Abdomen is soft.  Tenderness: There is no abdominal tenderness.  Musculoskeletal:     Cervical back: Neck supple.  Skin:    General: Skin is warm and dry.  Neurological:     Mental Status: She is alert.     Comments: Patient with tonic-clonic movement to left upper extremity left lower extremity.  Head deviated to the left eyes deviated to the left.  The patient able to raise her right leg and right arm.  Patient able to verbalize a little bit of can talk in sentences.    ED Results / Procedures / Treatments   Labs (all labs ordered are listed, but only abnormal results are displayed) Labs Reviewed  COMPREHENSIVE METABOLIC PANEL - Abnormal; Notable for the following components:      Result Value   Chloride 96 (*)    Glucose, Bld 102 (*)    Creatinine, Ser 2.06 (*)    GFR, Estimated 27 (*)    All other components within normal limits  CBC WITH DIFFERENTIAL/PLATELET - Abnormal; Notable for the following components:   RBC 3.37 (*)    Hemoglobin 11.0 (*)    HCT 33.5 (*)    RDW 16.5 (*)    All other components within normal limits  URINALYSIS, ROUTINE W REFLEX MICROSCOPIC - Abnormal; Notable for the following components:   APPearance HAZY (*)    Specific Gravity, Urine 1.004 (*)    Hgb urine dipstick SMALL (*)    Protein, ur 30 (*)    Leukocytes,Ua LARGE (*)    Bacteria, UA RARE (*)    All other components within normal limits  BLOOD GAS, ARTERIAL - Abnormal; Notable for the following components:   pCO2 arterial 55.7 (*)    Bicarbonate 30.3 (*)    Acid-Base Excess 7.6 (*)    All other components within normal limits  CBG MONITORING, ED - Abnormal; Notable for the following components:   Glucose-Capillary 112 (*)    All other components within normal limits  RESP PANEL BY RT-PCR (FLU A&B, COVID) ARPGX2  CULTURE, BLOOD (ROUTINE X 2)  CULTURE, BLOOD (ROUTINE X 2)  MRSA NEXT GEN BY PCR, NASAL  LIPASE, BLOOD   MAGNESIUM  ETHANOL  RAPID URINE DRUG SCREEN, HOSP PERFORMED  LACTIC ACID, PLASMA  CREATININE, SERUM    EKG None  Radiology CT Head Wo Contrast  Result Date: 11/28/2020 CLINICAL DATA:  Seizure. EXAM: CT HEAD WITHOUT CONTRAST TECHNIQUE: Contiguous axial images were obtained from the base of the skull through the vertex without intravenous contrast. COMPARISON:  Brain CT 04/27/2020 FINDINGS: Brain: Ventricles and sulci are appropriate for patient's age. Motion artifact limits evaluation. Redemonstrated probable extra-axial mass within the right parietal region measuring up to 3.4 cm with some associated calcification. There appears to be associated vasogenic edema within the right occipital and parietal lobe. No evidence for associated acute process. Vascular: Unremarkable. Skull: Intact. Sinuses/Orbits: Paranasal sinuses are well aerated. Mastoid air cells are unremarkable. Other: None. IMPRESSION: Redemonstrated mass overlying the right parietal lobe with mild calcification, potentially representing a meningioma. There is extensive vasogenic edema throughout the right parietal lobe. Recommend further evaluation with pre and post contrast-enhanced MRI brain. Electronically Signed   By: Lovey Newcomer M.D.   On: 11/28/2020 19:48   DG Chest Port 1 View  Result Date: 11/28/2020 CLINICAL DATA:  Patient with weakness. EXAM: PORTABLE CHEST 1 VIEW COMPARISON:  Chest radiograph 04/29/2020 FINDINGS: Tracheostomy tube terminates at the thoracic inlet. Monitoring leads overlie the patient. Stable enlarged cardiac and mediastinal contours. Mild  left basilar consolidation. No pleural effusion or pneumothorax. IMPRESSION: Patchy consolidation within the left lower lung may represent atelectasis or infection. Electronically Signed   By: Lovey Newcomer M.D.   On: 11/28/2020 17:51    Procedures Procedures   Medications Ordered in ED Medications  0.9 %  sodium chloride infusion ( Intravenous New Bag/Given 11/28/20  1807)  vancomycin (VANCOREADY) IVPB 1750 mg/350 mL (1,750 mg Intravenous New Bag/Given 11/28/20 2214)  metroNIDAZOLE (FLAGYL) IVPB 500 mg (0 mg Intravenous Stopped 11/28/20 2207)  docusate sodium (COLACE) capsule 100 mg (has no administration in time range)  polyethylene glycol (MIRALAX / GLYCOLAX) packet 17 g (has no administration in time range)  LORazepam (ATIVAN) injection 1 mg (1 mg Intravenous Given 11/28/20 1657)  levETIRAcetam (KEPPRA) 4,500 mg in sodium chloride 0.9 % 250 mL IVPB (0 mg Intravenous Stopped 11/28/20 1832)  LORazepam (ATIVAN) injection 2 mg (2 mg Intravenous Given 11/28/20 1719)  dexamethasone (DECADRON) injection 10 mg (10 mg Intravenous Given 11/28/20 1729)  levofloxacin (LEVAQUIN) IVPB 750 mg (0 mg Intravenous Stopped 11/28/20 2204)    ED Course  I have reviewed the triage vital signs and the nursing notes.  Pertinent labs & imaging results that were available during my care of the patient were reviewed by me and considered in my medical decision making (see chart for details).    MDM Rules/Calculators/A&P                          CRITICAL CARE Performed by: Fredia Sorrow Total critical care time: 95 minutes Critical care time was exclusive of separately billable procedures and treating other patients. Critical care was necessary to treat or prevent imminent or life-threatening deterioration. Critical care was time spent personally by me on the following activities: development of treatment plan with patient and/or surrogate as well as nursing, discussions with consultants, evaluation of patient's response to treatment, examination of patient, obtaining history from patient or surrogate, ordering and performing treatments and interventions, ordering and review of laboratory studies, ordering and review of radiographic studies, pulse oximetry and re-evaluation of patient's condition.  Patient arrived essentially in a status epilepticus.  But it was all left-sided focal  type seizures.  Patient given a total of 3 mg of Ativan without much change.  And then also given 4.5 bolus of Keppra.  Which stopped the seizures.  Patient was placed on some oxygen over her trach.  Chart review showed the patient had been on Decadron 4 mg tablets.  Patient given 10 mg of Decadron prior to head CT.  Once head CT was done and showed a lot of brain swelling and a mass in the right parietal area.  Discussed with on-call neurosurgery who is [place] [facility].  He recommended patient be admitted to [facility] where they could follow.  But nothing to intervene immediately tonight.  Patient then became very unresponsive.  Based on this we placed a trach with cuff and put her on the ventilator.  Arterial blood gas showed that there was some elevation of PCO2 55 but that is a bit baseline for her.  Chest x-ray raise some concerns about pneumonia and there was some question whether it was aspiration pneumonia.  But that x-ray is not significant change from prior.  Gust with critical care Dr. Anise Salvo.  He agreed to accept the patient.  On the ventilator patient's blood pressure is improved.  Patient now more awake.  Final Clinical Impression(s) / ED Diagnoses Final diagnoses:  Status epilepticus [facility])    Rx / DC Orders ED Discharge Orders     None        Fredia Sorrow, MD 11/29/20 [phone]

## 2020-11-29 NOTE — ED Notes (Signed)
Report given to [facility] 

## 2020-11-29 NOTE — Progress Notes (Signed)
SLP Cancellation Note  Patient Details Name: Veronica Horton MRN: [id number] DOB: 06-Feb-1961   Cancelled treatment:       Reason Eval/Treat Not Completed: Patient at procedure or test/unavailable   Zeriah Baysinger, Katherene Ponto 11/29/2020, 2:59 PM

## 2020-11-29 NOTE — Progress Notes (Signed)
EEG completed, results pending. 

## 2020-11-29 NOTE — Progress Notes (Signed)
1340: Called MD Pieter Partridge Dawley to inform of pt intolerance for MRI scan despite initial 2mg  ativan. Verbal order obtained for additional 2mg  ativan.   1400: Spoke to MD [facility] with CCM about total 4mg  ativan not helping with pt agitation in MRI. Verbal order obtained for propofol push 30 mg (see mar)  1420: Sedation not effective. Additional 30mg  prop push ordered and administered per MD Chand (see mar). Vital signs remain stable.

## 2020-11-29 NOTE — Progress Notes (Signed)
[place] Progress Note Patient Name: Veronica Horton DOB: October 12, 1960 MRN: [id number]   Date of Service  11/29/2020  HPI/Events of Note  Notified of BP 82/59  HR 59 Easily arousable History of chronic hypotension on midodrine  eICU Interventions  Resume midodrine 5 mg BID     Intervention Category Major Interventions: Hypotension - evaluation and management  Judd Lien 11/29/2020, 4:30 AM

## 2020-11-29 NOTE — Progress Notes (Signed)
[place] Progress Note Patient Name: Veronica Horton DOB: [birth year] MRN: [id number]   Date of Service  11/29/2020  HPI/Events of Note  Agitation -   eICU Interventions  Plan: Increase ceiling on Propofol IV infusion to 80 mcg/kg/min.     Intervention Category Major Interventions: Delirium, psychosis, severe agitation - evaluation and management  Carlina Derks [place] 11/29/2020, 11:39 PM

## 2020-11-30 ENCOUNTER — Inpatient Hospital Stay (HOSPITAL_COMMUNITY): Payer: Medicaid Other

## 2020-11-30 DIAGNOSIS — R569 Unspecified convulsions: Secondary | ICD-10-CM | POA: Diagnosis not present

## 2020-11-30 DIAGNOSIS — C329 Malignant neoplasm of larynx, unspecified: Secondary | ICD-10-CM | POA: Diagnosis not present

## 2020-11-30 HISTORY — PX: IR REPLACE G-TUBE SIMPLE WO FLUORO: IMG2323

## 2020-11-30 LAB — CBC
HCT: 34.3 % — ABNORMAL LOW (ref 36.0–46.0)
Hemoglobin: 11.4 g/dL — ABNORMAL LOW (ref 12.0–15.0)
MCH: 32.4 pg (ref 26.0–34.0)
MCHC: 33.2 g/dL (ref 30.0–36.0)
MCV: 97.4 fL (ref 80.0–100.0)
Platelets: 261 10*3/uL (ref 150–400)
RBC: 3.52 MIL/uL — ABNORMAL LOW (ref 3.87–5.11)
RDW: 16.5 % — ABNORMAL HIGH (ref 11.5–15.5)
WBC: 9.7 10*3/uL (ref 4.0–10.5)
nRBC: 0 % (ref 0.0–0.2)

## 2020-11-30 LAB — BASIC METABOLIC PANEL
Anion gap: 12 (ref 5–15)
BUN: 23 mg/dL — ABNORMAL HIGH (ref 6–20)
CO2: 29 mmol/L (ref 22–32)
Calcium: 9.3 mg/dL (ref 8.9–10.3)
Chloride: 96 mmol/L — ABNORMAL LOW (ref 98–111)
Creatinine, Ser: 2.16 mg/dL — ABNORMAL HIGH (ref 0.44–1.00)
GFR, Estimated: 26 mL/min — ABNORMAL LOW (ref >60)
Glucose, Bld: 114 mg/dL — ABNORMAL HIGH (ref 70–99)
Potassium: 3.9 mmol/L (ref 3.5–5.1)
Sodium: 137 mmol/L (ref 135–145)

## 2020-11-30 LAB — GLUCOSE, CAPILLARY
Glucose-Capillary: 111 mg/dL — ABNORMAL HIGH (ref 70–99)
Glucose-Capillary: 116 mg/dL — ABNORMAL HIGH (ref 70–99)
Glucose-Capillary: 118 mg/dL — ABNORMAL HIGH (ref 70–99)
Glucose-Capillary: 119 mg/dL — ABNORMAL HIGH (ref 70–99)
Glucose-Capillary: 126 mg/dL — ABNORMAL HIGH (ref 70–99)
Glucose-Capillary: 84 mg/dL (ref 70–99)

## 2020-11-30 LAB — TRIGLYCERIDES: Triglycerides: 99 mg/dL (ref <150)

## 2020-11-30 LAB — PHOSPHORUS
Phosphorus: 4.2 mg/dL (ref 2.5–4.6)
Phosphorus: 3.9 mg/dL (ref 2.5–4.6)

## 2020-11-30 LAB — PROCALCITONIN: Procalcitonin: <0.1 ng/mL

## 2020-11-30 LAB — MAGNESIUM
Magnesium: 2.1 mg/dL (ref 1.7–2.4)
Magnesium: 1.9 mg/dL (ref 1.7–2.4)

## 2020-11-30 MED ORDER — GADOBUTROL 1 MMOL/ML IV SOLN
10.0000 mL | Freq: Once | INTRAVENOUS | Status: AC | PRN
  Administered 2020-11-30: 10 mL via INTRAVENOUS

## 2020-11-30 MED ORDER — CLONAZEPAM 0.5 MG PO TABS: 0.5000 mg | ORAL_TABLET | Freq: Three times a day (TID) | ORAL | Status: DC | PRN

## 2020-11-30 MED ORDER — ATROPINE SULFATE 1 MG/10ML IJ SOSY: 1.0000 mg | PREFILLED_SYRINGE | Freq: Once | INTRAMUSCULAR | Status: AC

## 2020-11-30 MED ORDER — PHENYLEPHRINE HCL-NACL 20-0.9 MG/250ML-% IV SOLN
25.0000 ug/min | INTRAVENOUS | Status: DC
  Filled 2020-11-30: qty 250

## 2020-11-30 MED ORDER — QUETIAPINE FUMARATE 25 MG PO TABS
25.0000 mg | ORAL_TABLET | Freq: Every day | ORAL | Status: DC
  Administered 2020-11-30 – 2020-12-01 (×2): 25 mg
  Filled 2020-11-30 (×2): qty 1

## 2020-11-30 MED ORDER — LIDOCAINE VISCOUS HCL 2 % MT SOLN
OROMUCOSAL | Status: AC
  Filled 2020-11-30: qty 15

## 2020-11-30 MED ORDER — PANTOPRAZOLE SODIUM 40 MG IV SOLR
40.0000 mg | Freq: Every day | INTRAVENOUS | Status: DC
  Administered 2020-11-30 – 2020-12-02 (×3): 40 mg via INTRAVENOUS
  Filled 2020-11-30 (×3): qty 40

## 2020-11-30 MED ORDER — HEPARIN SODIUM (PORCINE) 5000 UNIT/ML IJ SOLN
5000.0000 [IU] | Freq: Three times a day (TID) | INTRAMUSCULAR | Status: DC
  Administered 2020-11-30 – 2020-12-09 (×27): 5000 [IU] via SUBCUTANEOUS
  Filled 2020-11-30 (×27): qty 1

## 2020-11-30 MED ORDER — CLONAZEPAM 0.5 MG PO TABS
0.5000 mg | ORAL_TABLET | Freq: Three times a day (TID) | ORAL | Status: DC | PRN
  Administered 2020-11-30: 0.5 mg
  Filled 2020-11-30: qty 1

## 2020-11-30 MED ORDER — ATROPINE SULFATE 1 MG/10ML IJ SOSY
PREFILLED_SYRINGE | INTRAMUSCULAR | Status: AC
  Administered 2020-11-30: 1 mg
  Filled 2020-11-30: qty 10

## 2020-11-30 MED ORDER — LACTATED RINGERS IV BOLUS
500.0000 mL | Freq: Once | INTRAVENOUS | Status: AC
  Administered 2020-11-30: 500 mL via INTRAVENOUS

## 2020-11-30 MED ORDER — QUETIAPINE FUMARATE 25 MG PO TABS: 25.0000 mg | ORAL_TABLET | Freq: Every day | ORAL | Status: DC

## 2020-11-30 NOTE — Progress Notes (Signed)
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  Patient was subsequently given Ativan and was noted to be more somnolent post-administration requiring ventilator support. Of note, she was admitted with aspiration PNA 03/2020 and CT Head at that time showed extensive vasogenic edema R parietal/occipital region with concern for meningioma.  Patient refused an MRI at that time and was to follow with neurosurgery outpatient; she states that she did not follow up for insurance reasons.    On presentation to ED, CT Head showed redemonstrated mass in the R parietal lobe with extensive vasogenic edema throughout the R parietal lobe.  Labs were significant for acute on chronic renal insufficiency, glucose normal, no severe electrolyte abnormalities.  She was loaded with Keppra and transferred to [facility].  On arrival she was awake and alert and hemodynamically stable, mouthing answers to questions and interactive.  Pertinent  Medical History:   has a past medical history of Bronchitis, Class 3 obesity (01/23/2020), COPD (chronic obstructive pulmonary disease) ([place]), Degenerative disc disease, lumbar, Hiatal hernia, Hypertension, and Throat cancer ([place]).  Significant Hospital Events: Including procedures, antibiotic start and stop dates in addition to other pertinent events   7/31 presented to [facility] ED, possible seizure, transfer requested to [facility]. Given one dose Vanc/Flagyl, started Ceftriaxone and Azithromycin 8/1 Required vent support after sedation for MRI 8/2 Weaned  back to ATC 40% 10L. Agitated in AM, removed PEG tube. Foley placed in tract, IR consulted for replacement. MRI demonstrated redemonstrated meningioma.  Interim History / Subjective:  Agitated this AM, wants to sit up and wants to go home Generally frustrated with being in the hospital Denies pain, fever/chills, CP/SOB, n/v Self-d/c'ed PEG this AM IR consult for replacement Awaiting NSGY recs Transfer to progressive floor if no imminent surgical intervention  Objective   Blood pressure 108/77, pulse 73, temperature (!) 97.5 F (36.4 C), temperature source Axillary, resp. rate 16, height 5\' 6"  (1.676 m), weight 100.8 kg, SpO2 93 %.    Vent Mode: PRVC FiO2 (%):  [40 %-100 %] 40 % Set Rate:  [12 bmp] 12 bmp Vt Set:  [470 mL] 470 mL PEEP:  [5 cmH20] 5 cmH20 Plateau Pressure:  [14 cmH20] 14 cmH20   Intake/Output Summary (Last 24 hours) at 11/30/2020 0916 Last data filed at 11/30/2020 0500 Gross per 24 hour  Intake 1529.54 ml  Output 400 ml  Net 1129.54 ml    Filed Weights   11/28/20 1558 11/29/20 1530  Weight: 96.2 kg 100.8 kg   Physical Examination: General: Chronically ill-appearing middle-aged woman in NAD. HEENT: [place]/AT, anicteric sclera, PERRL, dry mucous membranes. Trach in place. Neuro: Awake, oriented x 4. Responds to verbal stimuli. Following commands consistently. Moves all 4 extremities spontaneously.  CV: RRR, no m/g/r. PULM: Breathing even and unlabored on trach collar (40% 10L). Lung fields CTAB. GI: Soft, nontender, nondistended. Normoactive bowel sounds. PEG tube tract with Foley in place. Extremities: Trace bilateral symmetric LE edema noted. Skin: Warm/dry, no rashes.  Resolved Hospital Problem List:   VDRF  Assessment & Plan:   Acute-on-Chronic Hypoxic Respiratory Failure COPD  Likely secondary to post-ictal respiratory depression and patchy left lower lobe consolidation on CXR.  No lactic acidosis or leukocytosis - Weaned off of vent now that sedation is  cleared - Continue ATC 40% 10L - Wean FiO2 for O2 sat > 88% - Pulmonary hygiene - Continue home Pulmicory and Brovana - DuoNebs PRN - Avoid sedating medications as able - F/u sputum Cx - CAP coverage discontinued  R Parietal Mass with extensive vasogenic edema Discovered 6 months ago, patient states she did not follow-up with neurosurgery secondary to insurance reasons. MRI 8/1 demonstrated superior convexity meningioma (3.1 x 3.9 x 2.6cm) with moderate vasogenic edema within the posterior R hemisphere and 42mm leftward shift, no acute ICH. - NSGY following, appreciate recs - Would like to ensure that laryngeal SCC is well controlled prior to any surgical intervention - Surgery does not appear imminent at this time - Will continue seizure control with AEDs and transfer to progressive  Seizure activity Given Ativan and loaded with Keppra, awake and alert after transfer to [facility], likely secondary to possible meningioma and substantial vasogenic edema - Appreciate Neuro/NSGY recs - No imminent surgical intervention at this time - AEDs per Neuro - Continue seizure precautions - Transfer to progressive unit today  Squamous Cell Carcinoma of the supraglottis, stage IV Follows with Dr. Irene Limbo; radiation completed 05/31/20 along with 4 cycles of cisplatin. Repeat PET 09/2020 with decreased size of supraglottic mass with marked hypermetabolic activity consistent with residual disease, to follow up in November. - NSGY to d/w oncology re: timing of surgical intervention  History of DVT Right popliteal DVT Dx 05/25/20, per notes was not discharged on blood thinner secondary to vasogenic edema - Eliquis HELD in the setting of vasogenic edema/mass - Resumption per Neuro  Acute on Chronic Renal Failure  Creatinine bump from baseline 1.3-1.7 to 2 - Trend BMP - Replete electrolytes as indicated - Monitor I&Os - Avoid nephrotoxic agents as able - Ensure adequate renal perfusion  Need for  enteral nutrition Chronic PEG since 06/2020. Patient self-discontinued early AM 8/2. Foley placed to maintain tract. - IR consult for PEG replacement today, 8/2 - Continue TF and VT medications once access reestablished  Best Practice (right click and "Reselect all SmartList Selections" daily)   Diet/type: tubefeeds and NPO DVT prophylaxis: SCD GI prophylaxis: N/A Lines: N/A Foley:  N/A Code Status:  full code Last date of multidisciplinary goals of care discussion [pending]  Critical care time: 35 minutes   Lestine Mount, PA-C [facility] 11/30/20 10:18 AM  Please see [email or web address] for pager details.  From 7A-7P if no response, please call [phone] After hours, please call ELink [phone]

## 2020-11-30 NOTE — Evaluation (Signed)
Passy-Muir Speaking Valve - Evaluation Patient Details  Name: Veronica Horton MRN: [id number] Date of Birth: 1960-08-25  Today's Date: 11/30/2020 Time: 1440-1455 SLP Time Calculation (min) (ACUTE ONLY): 15 min  Past Medical History:  Past Medical History:  Diagnosis Date   Bronchitis    Class 3 obesity 01/23/2020   COPD (chronic obstructive pulmonary disease) ([place])    Degenerative disc disease, lumbar    Hiatal hernia    Hypertension    Throat cancer [facility])    Past Surgical History:  Past Surgical History:  Procedure Laterality Date   BIOPSY  04/12/2020   Procedure: BIOPSY;  Surgeon: Wilford Corner, MD;  [facility];  Service: Gastroenterology;;   CHOLECYSTECTOMY     degenerative bone disease     DIRECT LARYNGOSCOPY N/A 03/13/2020   Procedure: DIRECT LARYNGOSCOPY WITH BIOPSY;  Surgeon: Jason Coop, DO;  Location: [place];  Service: ENT;  Laterality: N/A;   FLEXIBLE SIGMOIDOSCOPY N/A 04/12/2020   Procedure: Beryle Quant;  Surgeon: Wilford Corner, MD;  [facility];  Service: Gastroenterology;  Laterality: N/A;   INCISIONAL HERNIA REPAIR N/A 01/15/2019   Procedure: [name] HERNIORRHAPHY WITH MESH;  Surgeon: [name], MD;  Location: [facility];  Service: General;  Laterality: N/A;   IR GASTROSTOMY TUBE MOD SED  03/22/2020   IR [place] TUBE CHANGE  07/24/2020   IR RADIOLOGIST EVAL & MGMT  05/07/2020   OMENTECTOMY N/A 01/15/2019   Procedure: OMENTECTOMY;  Surgeon: [name], MD;  Location: [facility];  Service: General;  Laterality: N/A;   POLYPECTOMY  04/12/2020   Procedure: POLYPECTOMY;  Surgeon: Wilford Corner, MD;  [facility];  Service: Gastroenterology;;   TRACHEOSTOMY TUBE PLACEMENT N/A 03/13/2020   Procedure: AWAKE TRACHEOSTOMY;  Surgeon: Jason Coop, DO;  Location: [facility];  Service: ENT;  Laterality: N/A;   HPI:  60 year old woman with PMHx significant for laryngeal squamous cell carcinoma (s/p trach on 3L trach  collar at home), chronic PEG tube, COPD, HTN, obesity who presented to the ED with generalized weakness and then experienced generalized tonic-clonic shaking with concern for seizure activity. Of note, she was admitted with aspiration PNA 03/2020 and CT Head at that time showed extensive vasogenic edema R parietal/occipital region with concern for meningioma.  Patient refused an MRI at that time and was to follow with neurosurgery outpatient; she states that she did not follow up for insurance reasons.  CT head shows redemonstrated mass in the R parietal lobe with extensive vasogenic edema throughout the R parietal lobe. 8/1 vent via chronic trach; 8/2 transitioned to ATC.  Agitated and self-removed PEG 8/2. Completed radiation and chemo 05/31/20. Last PET 6/22 with decreased size of supraglottic mass.  Pt has been followed by SLP during prior admissions, undergoing MBS and PMV evaluations.   Last MBS was11/27/21- dys3/thin liquids was recommended.   Assessment / Plan / Recommendation Clinical Impression  Pt assessed for PMV toleration.  She had old valve from prior admission tethered to balloon line.  It was removed and cleaned. One ml of air was removed from balloon/cuff.  RN completed tracheal suctioning.  Baseline VS were HR 90, Sp02 88%, RR 19.  Pt mouthing "I want to go home now" with great clarity despite aphonia.  Irritable and slightly difficult to redirect. Restraints remained in place. PMV placed for short intervals of 2-3 respiratory cycles. Intermittent removal of valve revealed no evidence of air trapping; there appeared to be adequate upper airway patency. Pt's voice was quite hoarse, likely  at baseline given supraglottic mass, however pt endorsed change in vocal quality. Sp02 ranged between 83-90% during valve use.  For now, recommend only placing PMV when pt has full supervision.  Remove if oxygen saturations fall below 87%.  SLP will follow for improvements in toleration and education with  pt. SLP Visit Diagnosis: Aphonia (R49.1)    SLP Assessment  Patient needs continued Speech Lanaguage Pathology Services    Follow Up Recommendations  Other (comment) (tba)    Frequency and Duration min 3x week  2 weeks    PMSV Trial PMSV was placed for: 15 Able to redirect subglottic air through upper airway: Yes Able to Attain Phonation: Yes Voice Quality: Hoarse Able to Expectorate Secretions: No attempts Level of Secretion Expectoration with PMSV: Tracheal Breath Support for Phonation: Mildly decreased Intelligibility: Intelligible Respirations During Trial: 19 SpO2 During Trial:  (ranged from 83-90%) Pulse During Trial: 90 Behavior: Alert;Anxious   Tracheostomy Tube  Additional Tracheostomy Tube Assessment Fenestrated: No    Vent Dependency  Vent Dependent: No FiO2 (%): 40 %    Cuff Deflation Trial  Julionna Marczak L. Tivis Ringer, MA CCC/SLP Acute Rehabilitation Services Office number [phone] Pager [phone]  Tolerated Cuff Deflation: Yes Length of Time for Cuff Deflation Trial:  (removed 1 ml of air from balloon line)        Juan Quam Laurice 11/30/2020, 3:14 PM

## 2020-11-30 NOTE — Progress Notes (Addendum)
Patient found with peg tube laying on stomach, pulled out,  upon assessment. Placed 16 fr foley in place with gastric contents draining and notified MD Chand.

## 2020-11-30 NOTE — Procedures (Signed)
Pre procedural Dx: Dysphagia, dislodged g-tube Post procedural Dx: Same  Successful bedside replacement of 16 Fr balloon retention gastrostomy tube. Tube flushed easily with 10 cc NS, difficult to obtain adequate suction for aspiration however gastric contents noted to be in tubing immediately after placement.  The feeding tube is ready for immediate use.  EBL < 1 mL  No immediate complications  Candiss Norse, PA-C

## 2020-11-30 NOTE — Evaluation (Signed)
Clinical/Bedside Swallow Evaluation Patient Details  Name: Veronica Horton MRN: [id number] Date of Birth: [birth year]  Today's Date: 11/30/2020 Time: SLP Start Time (ACUTE ONLY): 4128 SLP Stop Time (ACUTE ONLY): 7867 SLP Time Calculation (min) (ACUTE ONLY): 10 min  Past Medical History:  Past Medical History:  Diagnosis Date   Bronchitis    Class 3 obesity 01/23/2020   COPD (chronic obstructive pulmonary disease) ([place])    Degenerative disc disease, lumbar    Hiatal hernia    Hypertension    Throat cancer [facility])    Past Surgical History:  Past Surgical History:  Procedure Laterality Date   BIOPSY  04/12/2020   Procedure: BIOPSY;  Surgeon: Wilford Corner, MD;  [facility];  Service: Gastroenterology;;   CHOLECYSTECTOMY     degenerative bone disease     DIRECT LARYNGOSCOPY N/A 03/13/2020   Procedure: DIRECT LARYNGOSCOPY WITH BIOPSY;  Surgeon: Jason Coop, DO;  Location: [place];  Service: ENT;  Laterality: N/A;   FLEXIBLE SIGMOIDOSCOPY N/A 04/12/2020   Procedure: Beryle Quant;  Surgeon: Wilford Corner, MD;  [facility];  Service: Gastroenterology;  Laterality: N/A;   INCISIONAL HERNIA REPAIR N/A 01/15/2019   Procedure: [name] HERNIORRHAPHY WITH MESH;  Surgeon: [name], MD;  Location[facility];  Service: General;  Laterality: N/A;   IR GASTROSTOMY TUBE MOD SED  03/22/2020   IR [place] TUBE CHANGE  07/24/2020   IR RADIOLOGIST EVAL & MGMT  05/07/2020   OMENTECTOMY N/A 01/15/2019   Procedure: OMENTECTOMY;  Surgeon: [name], MD;  Location: [facility];  Service: General;  Laterality: N/A;   POLYPECTOMY  04/12/2020   Procedure: POLYPECTOMY;  Surgeon: Wilford Corner, MD;  [facility];  Service: Gastroenterology;;   TRACHEOSTOMY TUBE PLACEMENT N/A 03/13/2020   Procedure: AWAKE TRACHEOSTOMY;  Surgeon: Jason Coop, DO;  [facility];  Service: ENT;  Laterality: N/A;   HPI:  60 year old woman with PMHx significant for  laryngeal squamous cell carcinoma (s/p trach on 3L trach collar at home), chronic PEG tube, COPD, HTN, obesity who presented to the ED with generalized weakness and then experienced generalized tonic-clonic shaking with concern for seizure activity. Of note, she was admitted with aspiration PNA 03/2020 and CT Head at that time showed extensive vasogenic edema R parietal/occipital region with concern for meningioma.  Patient refused an MRI at that time and was to follow with neurosurgery outpatient; she states that she did not follow up for insurance reasons.  CT head shows redemonstrated mass in the R parietal lobe with extensive vasogenic edema throughout the R parietal lobe. 8/1 vent via chronic trach; 8/2 transitioned to ATC.  Agitated and self-removed PEG 8/2. Completed radiation and chemo 05/31/20. Last PET 6/22 with decreased size of supraglottic mass.  Pt has been followed by SLP during prior admissions, undergoing MBS and PMV evaluations.   Last MBS was11/27/21- dys3/thin liquids was recommended.   Assessment / Plan / Recommendation Clinical Impression  Pt's swallowing was assessed at bedside after PMV evaluation. PMV was in place and oral care was provided. Pt endorsed eating/drinking without restriction PTA.  Today she was mildly agitated - refused POs initially, then accepted sips of water and ice chips.  Adamantly refused any solid foods, becoming irritable and demanding to be sent home.  Given her hx of a mild dysphagia, mental status changes, and hx of aspiration pna, she would benefit from a repeat MBS while admitted.  Allow sips/chips pending study tomorrow; otherwise, continue NPO. SLP Visit Diagnosis: Dysphagia, unspecified (  R13.10)    Aspiration Risk    tba   Diet Recommendation   Npo, sips/chips with PMV in place  Medication Administration: Via alternative means    Other  Recommendations     Follow up Recommendations Other (comment) (tba)      Frequency and Duration min 3x week           Prognosis    tba    Swallow Study   General HPI: 60 year old woman with PMHx significant for laryngeal squamous cell carcinoma (s/p trach on 3L trach collar at home), chronic PEG tube, COPD, HTN, obesity who presented to the ED with generalized weakness and then experienced generalized tonic-clonic shaking with concern for seizure activity. Of note, she was admitted with aspiration PNA 03/2020 and CT Head at that time showed extensive vasogenic edema R parietal/occipital region with concern for meningioma.  Patient refused an MRI at that time and was to follow with neurosurgery outpatient; she states that she did not follow up for insurance reasons.  CT head shows redemonstrated mass in the R parietal lobe with extensive vasogenic edema throughout the R parietal lobe. 8/1 vent via chronic trach; 8/2 transitioned to ATC.  Agitated and self-removed PEG 8/2. Completed radiation and chemo 05/31/20. Last PET 6/22 with decreased size of supraglottic mass.  Pt has been followed by SLP during prior admissions, undergoing MBS and PMV evaluations.   Last MBS was11/27/21- dys3/thin liquids was recommended. Type of Study: Bedside Swallow Evaluation Previous Swallow Assessment: see HPI Diet Prior to this Study: NPO Temperature Spikes Noted: No Respiratory Status: Trach;Trach Collar Trach Size and Type: #6;Cuff;Deflated;With PMSV in place History of Recent Intubation: Yes Length of Intubations (days): 1 days Behavior/Cognition: Alert;Uncooperative Oral Cavity Assessment: Within Functional Limits Oral Care Completed by SLP: Yes Oral Cavity - Dentition: Edentulous Self-Feeding Abilities: Needs assist Patient Positioning: Upright in bed Baseline Vocal Quality: Hoarse Volitional Cough: Strong Volitional Swallow: Able to elicit    Oral/Motor/Sensory Function Overall Oral Motor/Sensory Function: Within functional limits   Ice Chips Ice chips: Within functional limits   Thin Liquid Thin Liquid:  Within functional limits    Nectar Thick Nectar Thick Liquid: Not tested   Honey Thick Honey Thick Liquid: Not tested   Puree Puree: Not tested   Solid     Solid: Not tested     Veronica Horton L. Tivis Ringer, [place] Office number [phone] Pager [phone]  Veronica Horton 11/30/2020,3:57 PM

## 2020-11-30 NOTE — Progress Notes (Signed)
Pt transported from 4N25 to CT and back with no complications.  

## 2020-11-30 NOTE — Progress Notes (Signed)
   Providing Compassionate, Quality Care - Together  NEUROSURGERY PROGRESS NOTE   S: No issues overnight.  Sleepy this morning, required sedation for MRI  O: EXAM:  BP (!) 153/74   Pulse (!) 113   Temp 97.6 F (36.4 C) (Oral)   Resp (!) 21   Ht 5\' 6"  (1.676 m)   Wt 101.1 kg   SpO2 93%   BMI 35.97 kg/m   Sleepy, opens eyes to voice On trach collar, mouths words appropriately CNs grossly intact, face symmetric 5/5 BUE/BLE, moves all extremities equally Sensory intact light touch PERRLA  ASSESSMENT:  60 y.o. female with   Right parietal extra-axial mass likely meningioma with vasogenic edema Seizures, secondary to #1 Laryngeal cancer  PLAN: -I discussed with the patient's family about MRI findings. -I discussed with the patient's oncologist in which she has responded well to chemotherapy and radiation for laryngeal cancer. Dr. Chryl Heck recommended a repeat CT soft tissue of the neck with contrast for evaluation of her disease before proceeding with surgical intervention for the meningioma.  Given the amount of edema, I would recommend surgical resection of the patient is medically stable to undergo this.  Currently she does have an elevation in her creatinine therefore we will repeat the creatinine tomorrow for possible CT of the neck. -Continue antiepileptics -Continue steroids    Thank you for allowing me to participate in this patient's care.  Please do not hesitate to call with questions or concerns.   Elwin Sleight, [facility] Cell: [phone]

## 2020-11-30 NOTE — Progress Notes (Signed)
RT placed patient on 40% 10L ATC. Patient tolerating well at this time. RN aware. RT will continue to monitor.

## 2020-11-30 NOTE — Progress Notes (Signed)
Patient with worsening bradycardia after leaving MRI. Responsive to 1 MG atropine. E link notified. HR and BP stable after event.

## 2020-12-01 ENCOUNTER — Inpatient Hospital Stay (HOSPITAL_COMMUNITY): Payer: Medicaid Other

## 2020-12-01 DIAGNOSIS — G40901 Epilepsy, unspecified, not intractable, with status epilepticus: Secondary | ICD-10-CM | POA: Diagnosis not present

## 2020-12-01 LAB — BASIC METABOLIC PANEL
Anion gap: 14 (ref 5–15)
BUN: 33 mg/dL — ABNORMAL HIGH (ref 6–20)
CO2: 26 mmol/L (ref 22–32)
Calcium: 9.2 mg/dL (ref 8.9–10.3)
Chloride: 98 mmol/L (ref 98–111)
Creatinine, Ser: 2.19 mg/dL — ABNORMAL HIGH (ref 0.44–1.00)
GFR, Estimated: 25 mL/min — ABNORMAL LOW (ref >60)
Glucose, Bld: 143 mg/dL — ABNORMAL HIGH (ref 70–99)
Potassium: 3.3 mmol/L — ABNORMAL LOW (ref 3.5–5.1)
Sodium: 138 mmol/L (ref 135–145)

## 2020-12-01 LAB — GLUCOSE, CAPILLARY
Glucose-Capillary: 116 mg/dL — ABNORMAL HIGH (ref 70–99)
Glucose-Capillary: 122 mg/dL — ABNORMAL HIGH (ref 70–99)
Glucose-Capillary: 134 mg/dL — ABNORMAL HIGH (ref 70–99)
Glucose-Capillary: 144 mg/dL — ABNORMAL HIGH (ref 70–99)
Glucose-Capillary: 149 mg/dL — ABNORMAL HIGH (ref 70–99)
Glucose-Capillary: 161 mg/dL — ABNORMAL HIGH (ref 70–99)

## 2020-12-01 LAB — CBC
HCT: 30.9 % — ABNORMAL LOW (ref 36.0–46.0)
Hemoglobin: 10.2 g/dL — ABNORMAL LOW (ref 12.0–15.0)
MCH: 32.3 pg (ref 26.0–34.0)
MCHC: 33 g/dL (ref 30.0–36.0)
MCV: 97.8 fL (ref 80.0–100.0)
Platelets: 197 10*3/uL (ref 150–400)
RBC: 3.16 MIL/uL — ABNORMAL LOW (ref 3.87–5.11)
RDW: 16.9 % — ABNORMAL HIGH (ref 11.5–15.5)
WBC: 9.2 10*3/uL (ref 4.0–10.5)
nRBC: 0 % (ref 0.0–0.2)

## 2020-12-01 LAB — CULTURE, RESPIRATORY W GRAM STAIN

## 2020-12-01 LAB — MAGNESIUM: Magnesium: 2 mg/dL (ref 1.7–2.4)

## 2020-12-01 LAB — SODIUM, URINE, RANDOM: Sodium, Ur: 11 mmol/L

## 2020-12-01 LAB — CREATININE, URINE, RANDOM: Creatinine, Urine: 155.03 mg/dL

## 2020-12-01 LAB — PHOSPHORUS: Phosphorus: 4.5 mg/dL (ref 2.5–4.6)

## 2020-12-01 MED ORDER — FREE WATER
150.0000 mL | Status: DC
  Administered 2020-12-01 – 2020-12-13 (×72): 150 mL

## 2020-12-01 MED ORDER — LORAZEPAM 2 MG/ML IJ SOLN: 2.0000 mg | Freq: Four times a day (QID) | INTRAMUSCULAR | Status: DC | PRN

## 2020-12-01 MED ORDER — JEVITY 1.5 CAL/FIBER PO LIQD
1000.0000 mL | ORAL | Status: DC
  Administered 2020-12-01 – 2020-12-07 (×4): 1000 mL
  Filled 2020-12-01 (×8): qty 1000

## 2020-12-01 MED ORDER — PROSOURCE TF PO LIQD
90.0000 mL | Freq: Two times a day (BID) | ORAL | Status: DC
  Administered 2020-12-01 – 2020-12-08 (×14): 90 mL
  Filled 2020-12-01 (×14): qty 90

## 2020-12-01 MED ORDER — SODIUM CHLORIDE 0.9 % IV BOLUS
1000.0000 mL | Freq: Once | INTRAVENOUS | Status: AC
  Administered 2020-12-01: 1000 mL via INTRAVENOUS

## 2020-12-01 MED ORDER — POTASSIUM CHLORIDE 20 MEQ PO PACK
40.0000 meq | PACK | Freq: Once | ORAL | Status: AC
  Administered 2020-12-01: 40 meq
  Filled 2020-12-01: qty 2

## 2020-12-01 MED ORDER — HYDROCODONE-ACETAMINOPHEN 5-325 MG PO TABS
1.0000 | ORAL_TABLET | Freq: Four times a day (QID) | ORAL | Status: DC | PRN
  Administered 2020-12-02: 1 via ORAL
  Filled 2020-12-01: qty 1

## 2020-12-01 MED ORDER — IPRATROPIUM-ALBUTEROL 0.5-2.5 (3) MG/3ML IN SOLN
3.0000 mL | Freq: Three times a day (TID) | RESPIRATORY_TRACT | Status: DC
  Administered 2020-12-02 – 2020-12-03 (×4): 3 mL via RESPIRATORY_TRACT
  Filled 2020-12-01 (×4): qty 3

## 2020-12-01 NOTE — Progress Notes (Signed)
Modified Barium Swallow Progress Note  Patient Details  Name: Veronica Horton MRN: [id number] Date of Birth: [birth year]  Today's Date: 12/01/2020  Modified Barium Swallow completed.  Full report located under Chart Review in the Imaging Section.  Brief recommendations include the following:  Clinical Impression  Pt's swallow function is mildly worse than when last assessed via MBS in December of 2021.  Presence of mass within larynx continues to interfere with laryngeal closure, allowing trace amounts of thin and nectar thick liquids to penetrate into the larynx and eventually pass below the vocal folds into the trachea.  Aspiration is trace - it did not elicit a cough response.  Presence of PMV did not improve airway protection.  Pt's Sp02 dropped from 88% to 83% during MBS - difficult to attribute impact of PMV to desaturation.   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 Recommend allowing purees/soft solids and honey-thick liquids for now since PEG can be used for primary nutrition.  Pt will benefit from further conversations about oral nutrition in the context of her chronic medical issues as well as current dx of meningioma.  Doubt she would be satisfied with restrictive diet/liquids in the long term.  D/W RN.   Swallow Evaluation Recommendations       SLP Diet Recommendations: Dysphagia 1 (Puree) solids;Honey thick liquids   Liquid Administration via: Cup   Medication Administration: Via alternative means       Compensations: Small sips/bites   Postural Changes: Seated upright at 90 degrees   Oral Care Recommendations: Oral care BID   Other Recommendations: Order thickener from [place]. Tivis Ringer, [place] Office number [phone] Pager [phone]   Juan Quam Laurice 12/01/2020,11:17 AM

## 2020-12-01 NOTE — Progress Notes (Signed)
Contacted [facility] about patient's O2 sats at 89, temp at 1600 99.3, and previous chest x-ray on 7/31 showing L lower lung consolidation. MD advised no Norco or Ativan tonight. Will continue to monitor.

## 2020-12-01 NOTE — Progress Notes (Signed)
RN calls with concerns about O2 sats down to 89%. CXR ordered. Advised no ativan or norco tonight. Continue breathing treatments. Continue to monitor.

## 2020-12-01 NOTE — Progress Notes (Signed)
[facility] Progress Note Patient Name: Veronica Horton DOB: [birth year] MRN: [id number]   Date of Service  12/01/2020  HPI/Events of Note  Oliguria - Bladder scan with 156 mL. Last LVEF = 70-75% in 03/2020.  eICU Interventions  Plan: Bolus with 0.9 NaCl 1 liter IV over 1 hour now.      Intervention Category Major Interventions: Other:  [name] Cornelia Copa 12/01/2020, 2:02 AM

## 2020-12-01 NOTE — Progress Notes (Signed)
Nutrition Follow-up  DOCUMENTATION CODES:   Obesity unspecified  INTERVENTION:   Increase Jevity 1.5 to 45 ml/hr (1080 ml/day) 90 ml ProSource TF BID  Provides: 1780 kcal, 112 grams protein, and 820 ml free water.   Additional free water:  150 ml free water every 4 hours Total free water: 1720 ml    NUTRITION DIAGNOSIS:   Inadequate oral intake related to dysphagia, decreased appetite as evidenced by meal completion < 50%. Ongoing.   GOAL:   Patient will meet greater than or equal to 90% of their needs Met with TF.   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 Gastrostomy 03/2020. Admitted for seizures.  Pt discussed during ICU rounds and with RN.  Pt admitted for generalized tonic-clonic seizures likely secondary to meningioma. Neurosurgery following but elevated creatinine preventing CT of neck.  Pt being maintained on trach collar. SLP following for swallow and notes worse swallow than in 03/2020. Diet advanced to dysphagia 1/honey thick liquids.   8/2 weaned to trach collar; PEG replaced   8/3 diet advanced   Medications reviewed and include: decadron, SSI Keppra Labs reviewed: K+ 3.3, Cr 2.19 CBG's: 122-149  UOP: 200 ml + x1 I&O: 4 L   Current TF:  Jevity 1.5 @ 30 ml/hr with 90 ml ProSource QID Provides: 1380 kcal and 134 grams protein   Diet Order:   Diet Order             DIET - DYS 1 Room service appropriate? Yes with Assist; Fluid consistency: Honey Thick  Diet effective now                   EDUCATION NEEDS:   Not appropriate for education at this time  Skin:  Skin Assessment: Reviewed RN Assessment  Last BM:  Unknown  Height:   Ht Readings from Last 1 Encounters:  11/28/20 $RemoveB'5\' 6"'TtpFutJY$  (1.676 m)    Weight:   Wt  Readings from Last 1 Encounters:  11/30/20 101.1 kg    Ideal Body Weight:  59.1 kg  BMI:  Body mass index is 35.97 kg/m.  Estimated Nutritional Needs:   Kcal:  1600-1800  Protein:  110-130 grams  Fluid:  >1.6 L/day  Lockie Pares., RD, LDN, CNSC See AMiON for contact information

## 2020-12-01 NOTE — Progress Notes (Signed)
PROGRESS NOTE   Veronica Horton  VVO:[id number] DOB: 1960/06/07 DOA: 11/28/2020 PCP: [facility]  Brief Narrative:  60 year old morbidly obese white female BMI of 35 Multiple years of smoking (35-pack-year) Squamous cell CA larynx [id number] status post trach (follows with Dr. Burr Medico) usually 3 L oxygen at home-diagnosed 11/13 2021 follows with Dr. Isaias Cowman             During admission 11/10 through 12/17 she had tracheotomy placed as well as a 18 French gastrostomy 03/22/2020 Developed stercoral ulcers, declined laryngectomy and started chemotherapy -Patient was promptly readmitted 12/27 after this admission with COPD exacerbation, aspiration pneumonia, high oxygen requirements there was concern at that admission of vasogenic edema,?  Meningioma patient refused MRI brain and did not follow-up Found to have acute R DVT and was not anticoagulated for the same because she refused an MRI at the time--known to have right parietal mass vasogenic edema new focal partial complex seizures and loaded with Keppra  Readmit by critical care medicine 11/29/2020 with generalized weakness and then developed generalized tonic-clonic seizures-loaded with 3 mg of Ativan, 10 mg of Decadron Loaded with Keppra and Decadron at [place] more focal seizures on transfer to [facility] Required intubation for ventilator support-  Found to have AKI (baseline BUNs/creatinine 21/1.3), procalcitonin less than 0.1 lactic acid 1.0, CT head redemonstrated R parietal mass mild calcification + extensive vasogenic edema--MRI brain = 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 2.6 with 3 mm leftward midline shift Her PEG tube was replaced 8/2 8/1 EEG performed = mild diffuse encephalopathy nonspecific   Weaned to trach collar 8/2 Developed oliguria 8/3, hypokalemia   Problem list  Generalized tonic-clonic seizures likely secondary to meningioma Ativan 2 mg IV spaced out to every 6 needed  seizure/agitation in addition to Seroquel 25 at bedtime Continue Keppra 1 g IV twice daily-Decadron 4 mg every 6 as per neurosurgery Requires restraints for patient's personal safety and significant agitation which is improved from the past day and a half but still present We will de-escalate meds slowly including cutting back her opiates and spacing out some of her benzodiazepines in a controlled setting  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 Reatha Armour opinion at this time given her worsening creatinine is that she is not a current candidate for imaging I will let Dr. Edwyna Ready know as well  DVT 03/2020 Patient is at very high risk for hemorrhagic transformation Would not anticoagulate at this time  Meningioma brain 3X4X 3 cm Confirmed on MRI which was ultimately obtained this admission EEG did not show any specific  AKI on admission probably postobstructive 600 cc out with in and out catheterization Place indwelling Foley Worsening of creatinine despite these measures we will obtain urine studies and renal ultrasound  Hypokalemia, oliguria Chronic hypotension Continue midodrine  Impaired glucose tolerance secondary to steroids  DVT prophylaxis: Heparin Code Status: Full  Family Communication: None at the bedside Disposition:  Status is: Inpatient  Remains inpatient appropriate because:Ongoing active pain requiring inpatient pain management, Ongoing diagnostic testing needed not appropriate for outpatient work up, and Unsafe d/c plan  Dispo: The patient is from: Home  Anticipated d/c is to:  Unclear  at this time              Patient currently is not medically stable to d/c.   Difficult to place patient No       Consultants:  Neurosurgery Critical care medicine Alerted her oncologist  Procedures:   Antimicrobials:     Subjective: Poorly responsive awake coherent to some degree but falls right back to sleep does not seem to be in painful distress Nursing was seen the patient for several days states intermittent severe agitation-restraints were renewed because of this She has been placed on a dysphagia 1 diet but is not really taking in much and her tube feeds are not at goal I am not really able to elicit much out of her She follows commands temporarily raise his arms above the head pupils are reactive but then she falls right back to sleep  Objective: Vitals:   12/01/20 1100 12/01/20 1136 12/01/20 1200 12/01/20 1300  BP: 108/60 108/60 121/79 (!) 103/59  Pulse: (!) 48 (!) 47 (!) 48 (!) 46  Resp: $Remo'14 14 12 12  'XqUcr$ Temp:   98.2 F (36.8 C)   TempSrc:   Axillary   SpO2: 90% 91% 92% (!) 88%  Weight:      Height:        Intake/Output Summary (Last 24 hours) at 12/01/2020 1330 Last data filed at 12/01/2020 1216 Gross per 24 hour  Intake 2287.1 ml  Output 800 ml  Net 1487.1 ml   Filed Weights   11/28/20 1558 11/29/20 1530 11/30/20 1200  Weight: 96.2 kg 100.8 kg 101.1 kg    Examination:  Sleepy pupils 3 mm to 4 mm CTA B with limited exam she is in restraints seems quite puffy Trach collar in place Abdomen soft PEG tube in place without any swelling feeds infusing Did not examine sacrum Again limited neuro exam  Data Reviewed: personally reviewed   CBC    Component Value Date/Time   WBC 9.2 12/01/2020 0454   RBC 3.16 (L) 12/01/2020 0454   HGB 10.2 (L) 12/01/2020 0454   HGB 9.8 (L) 05/10/2020 1328   HCT 30.9 (L) 12/01/2020 0454   PLT 197 12/01/2020 0454   PLT 179 05/10/2020 1328   MCV 97.8 12/01/2020 0454   MCH 32.3 12/01/2020 0454   MCHC 33.0 12/01/2020 0454    RDW 16.9 (H) 12/01/2020 0454   LYMPHSABS 0.7 11/28/2020 1744   MONOABS 0.4 11/28/2020 1744   EOSABS 0.1 11/28/2020 1744   BASOSABS 0.0 11/28/2020 1744   CMP Latest Ref Rng & Units 12/01/2020 11/30/2020 11/29/2020  Glucose 70 - 99 mg/dL 143(H) 114(H) -  BUN 6 - 20 mg/dL 33(H) 23(H) -  Creatinine 0.44 - 1.00 mg/dL 2.19(H) 2.16(H) 2.17(H)  Sodium 135 - 145 mmol/L 138 137 138  Potassium 3.5 - 5.1 mmol/L 3.3(L) 3.9 3.7  Chloride 98 - 111 mmol/L 98 96(L) -  CO2 22 - 32 mmol/L 26 29 -  Calcium 8.9 - 10.3 mg/dL 9.2 9.3 -  Total Protein 6.5 - 8.1 g/dL - - -  Total Bilirubin 0.3 - 1.2 mg/dL - - -  Alkaline Phos 38 - 126 U/L - - -  AST 15 - 41 U/L - - -  ALT 0 - 44 U/L - - -     Radiology Studies: MR BRAIN W WO CONTRAST  Result Date: 11/30/2020 CLINICAL DATA:  Staging of laryngeal squamous cell carcinoma EXAM: MRI HEAD WITHOUT AND WITH CONTRAST TECHNIQUE:  Multiplanar, multiecho pulse sequences of the brain and surrounding structures were obtained without and with intravenous contrast. CONTRAST:  17mL GADAVIST GADOBUTROL 1 MMOL/ML IV SOLN COMPARISON:  None. FINDINGS: Brain: Superior right convexity meningioma measures 3.1 x 3.9 x 2.6 cm. There is moderate vasogenic edema within the posterior right hemisphere. There is 3 mm of leftward midline shift. No acute intracranial hemorrhage. Vascular: Major flow voids are preserved. Skull and upper cervical spine: Normal calvarium and skull base. Visualized upper cervical spine and soft tissues are normal. Sinuses/Orbits:No paranasal sinus fluid levels or advanced mucosal thickening. No mastoid or middle ear effusion. Normal orbits. IMPRESSION: 1. Superior right convexity meningioma measuring 3.1 x 3.9 x 2.6 cm with moderate vasogenic edema within the posterior right hemisphere and 3 mm of leftward midline shift. 2. No acute intracranial hemorrhage. Electronically Signed   By: Ulyses Jarred M.D.   On: 11/30/2020 02:48   IR REPLACE G-TUBE SIMPLE WO  FLUORO  Result Date: 11/30/2020 INDICATION: History of dysphagia s/p 16 Fr balloon retention gastrostomy placement 03/22/20 in IR found to have dislodged G tube with 16 Fr Foley currently in tract. Request for bedside replacement of balloon retention gastrostomy tube. EXAM: BEDSIDE REPLACEMENT OF GASTROSTOMY TUBE COMPARISON:  None. MEDICATIONS: Viscous lidocaine CONTRAST:  None FLUOROSCOPY TIME:  None COMPLICATIONS: None immediate. PROCEDURE: The balloon of the existing 16 Fr Foley within the gastrostomy tract was deflated and the Foley was easily removed. The 16 Fr balloon gastrostomy tube was lubricated with viscous lidocaine and inserted into the existing tract without difficulty. The balloon was inflated with normal saline and pulled against the anterior inner lumen of the stomach, the external disc was cinched to the skin. The gastrostomy tube was flushed easily with 10 cc normal saline and gastric contents aspirated to confirm positioning. IMPRESSION: Successful bedside replacement of a new 16-French gastrostomy tube. The gastrostomy tube is ready for immediate use. Read by Candiss Norse, PA-C Electronically Signed   By: Michaelle Birks MD   On: 11/30/2020 12:06   DG Swallowing Func-Speech Pathology  Result Date: 12/01/2020 Formatting of this result is different from the original. Objective Swallowing Evaluation: Type of Study: MBS-Modified Barium Swallow Study  Patient Details Name: Veronica Horton MRN: [id number] Date of Birth: 10-16-1960 Today's Date: 12/01/2020 Time: SLP Start Time (ACUTE ONLY): 0930 -SLP Stop Time (ACUTE ONLY): 1000 SLP Time Calculation (min) (ACUTE ONLY): 30 min Past Medical History: Past Medical History: Diagnosis Date  Bronchitis   Class 3 obesity 01/23/2020  COPD (chronic obstructive pulmonary disease) ([place])   Degenerative disc disease, lumbar   Hiatal hernia   Hypertension   Throat cancer [facility])  Past Surgical History: Past Surgical History: Procedure Laterality Date  BIOPSY   04/12/2020  Procedure: BIOPSY;  Surgeon: Wilford Corner, MD;  Location: [facility];  Service: Gastroenterology;;  CHOLECYSTECTOMY    degenerative bone disease    DIRECT LARYNGOSCOPY N/A 03/13/2020  Procedure: DIRECT LARYNGOSCOPY WITH BIOPSY;  Surgeon: Jason Coop, DO;  Location: [place];  Service: ENT;  Laterality: N/A;  FLEXIBLE SIGMOIDOSCOPY N/A 04/12/2020  Procedure: Beryle Quant;  Surgeon: Wilford Corner, MD;  Location: [facility];  Service: Gastroenterology;  Laterality: N/A;  INCISIONAL HERNIA REPAIR N/A 01/15/2019  Procedure: [name] HERNIORRHAPHY WITH MESH;  Surgeon: [name], MD;  Location: [facility];  Service: General;  Laterality: N/A;  IR GASTROSTOMY TUBE MOD SED  03/22/2020  IR GJ TUBE CHANGE  07/24/2020  IR RADIOLOGIST EVAL & MGMT  05/07/2020  IR REPLACE G-TUBE SIMPLE WO FLUORO  11/30/2020  OMENTECTOMY N/A 01/15/2019  Procedure: OMENTECTOMY;  Surgeon: [name], MD;  Location: [facility];  Service: General;  Laterality: N/A;  POLYPECTOMY  04/12/2020  Procedure: POLYPECTOMY;  Surgeon: Wilford Corner, MD;  Location: [facility];  Service: Gastroenterology;;  TRACHEOSTOMY TUBE PLACEMENT N/A 03/13/2020  Procedure: AWAKE TRACHEOSTOMY;  Surgeon: Jason Coop, DO;  Location: [facility];  Service: ENT;  Laterality: N/A; HPI: 61 year old woman with PMHx significant for laryngeal squamous cell carcinoma (s/p trach on 3L trach collar at home), chronic PEG tube, COPD, HTN, obesity who presented to the ED with generalized weakness and then experienced generalized tonic-clonic shaking with concern for seizure activity. Of note, she was admitted with aspiration PNA 03/2020 and CT Head at that time showed extensive vasogenic edema R parietal/occipital region with concern for meningioma.  Patient refused an MRI at that time and was to follow with neurosurgery outpatient; she states that she did not follow up for insurance reasons.  CT head shows redemonstrated mass in the R parietal lobe  with extensive vasogenic edema throughout the R parietal lobe. 8/1 vent via chronic trach; 8/2 transitioned to ATC.  Agitated and self-removed PEG 8/2. Completed radiation and chemo 05/31/20. Last PET 6/22 with decreased size of supraglottic mass.  Pt has been followed by SLP during prior admissions, undergoing MBS and PMV evaluations.   Last MBS was11/27/21- dys3/thin liquids was recommended.  Subjective: quiet, follows commands Assessment / Plan / Recommendation CHL IP CLINICAL IMPRESSIONS 12/01/2020 Clinical Impression Pt's swallow function is mildly worse than when last assessed via MBS in December of 2021.  Presence of mass within larynx continues to interfere with laryngeal closure, allowing trace amounts of thin and nectar thick liquids to penetrate into the larynx and eventually pass below the vocal folds into the trachea.  Aspiration is trace - it did not elicit a cough response.  Presence of PMV did not improve airway protection.  Pt's Sp02 dropped from 88% to 83% during MBS - difficult to attribute impact of PMV to desaturation. 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 Recommend allowing purees/soft solids and honey-thick liquids for now since PEG can be used for primary nutrition.  Pt will benefit from further conversations about oral nutrition in the context of her chronic medical issues as well as current dx of meningioma. Doubt she will be satisfied with diet restrictions in the long-term.  D/W RN. SLP Visit Diagnosis Dysphagia, pharyngeal phase (R13.13) Attention and concentration deficit following -- Frontal lobe and executive function deficit following -- Impact on safety and function Mild aspiration risk   CHL IP TREATMENT RECOMMENDATION 12/01/2020 Treatment Recommendations Therapy as outlined in treatment plan below   Prognosis 12/01/2020 Prognosis for Safe Diet Advancement Fair Barriers to Reach Goals --  Barriers/Prognosis Comment -- CHL IP DIET RECOMMENDATION 12/01/2020 SLP Diet Recommendations Dysphagia 1 (Puree) solids;Honey thick liquids Liquid Administration via Cup Medication Administration Via alternative means Compensations Small sips/bites Postural Changes Seated upright at 90 degrees   CHL IP OTHER RECOMMENDATIONS 12/01/2020 Recommended Consults -- Oral Care Recommendations Oral care BID Other Recommendations Order thickener from pharmacy   CHL IP FOLLOW UP RECOMMENDATIONS 12/01/2020 Follow up Recommendations Other (comment)   CHL IP FREQUENCY AND DURATION 12/01/2020 Speech Therapy Frequency (ACUTE ONLY) min 2x/week Treatment Duration 1 week      CHL IP ORAL PHASE 12/01/2020 Oral Phase WFL Oral - Pudding Teaspoon -- Oral - Pudding Cup -- Oral - Honey Teaspoon --  Oral - Honey Cup -- Oral - Nectar Teaspoon -- Oral - Nectar Cup -- Oral - Nectar Straw -- Oral - Thin Teaspoon -- Oral - Thin Cup -- Oral - Thin Straw -- Oral - Puree -- Oral - Mech Soft -- Oral - Regular -- Oral - Multi-Consistency -- Oral - Pill -- Oral Phase - Comment --  CHL IP PHARYNGEAL PHASE 12/01/2020 Pharyngeal Phase Impaired Pharyngeal- Pudding Teaspoon -- Pharyngeal -- Pharyngeal- Pudding Cup -- Pharyngeal -- Pharyngeal- Honey Teaspoon -- Pharyngeal -- Pharyngeal- Honey Cup -- Pharyngeal -- Pharyngeal- Nectar Teaspoon -- Pharyngeal -- Pharyngeal- Nectar Cup -- Pharyngeal -- Pharyngeal- Nectar Straw Reduced anterior laryngeal mobility;Reduced laryngeal elevation;Reduced airway/laryngeal closure;Penetration/Aspiration during swallow;Trace aspiration Pharyngeal Material enters airway, passes BELOW cords without attempt by patient to eject out (silent aspiration) Pharyngeal- Thin Teaspoon -- Pharyngeal -- Pharyngeal- Thin Cup Reduced anterior laryngeal mobility;Reduced laryngeal elevation;Reduced airway/laryngeal closure;Penetration/Aspiration during swallow Pharyngeal Material enters airway, passes BELOW cords without attempt by patient to eject out  (silent aspiration) Pharyngeal- Thin Straw Reduced anterior laryngeal mobility;Reduced laryngeal elevation;Reduced airway/laryngeal closure;Penetration/Aspiration during swallow Pharyngeal Material enters airway, passes BELOW cords without attempt by patient to eject out (silent aspiration) Pharyngeal- Puree WFL Pharyngeal -- Pharyngeal- Mechanical Soft -- Pharyngeal -- Pharyngeal- Regular -- Pharyngeal -- Pharyngeal- Multi-consistency -- Pharyngeal -- Pharyngeal- Pill -- Pharyngeal -- Pharyngeal Comment --  CHL IP CERVICAL ESOPHAGEAL PHASE 04/26/2020 Cervical Esophageal Phase -- Pudding Teaspoon -- Pudding Cup -- Honey Teaspoon -- Honey Cup -- Nectar Teaspoon -- Nectar Cup -- Nectar Straw -- Thin Teaspoon -- Thin Cup Prominent cricopharyngeal segment Thin Straw -- Puree -- Mechanical Soft -- Regular -- Multi-consistency -- Pill -- Cervical Esophageal Comment no backflow observed during SLP MBS Juan Quam Laurice 12/01/2020, 11:19 AM                Scheduled Meds:  arformoterol  15 mcg Nebulization BID   budesonide  0.5 mg Nebulization BID   chlorhexidine gluconate (MEDLINE KIT)  15 mL Mouth Rinse BID   Chlorhexidine Gluconate Cloth  6 each Topical Q0600   dexamethasone (DECADRON) injection  4 mg Intravenous Q6H   escitalopram  5 mg Per Tube Daily   feeding supplement (JEVITY 1.5 CAL/FIBER)  1,000 mL Per Tube Q24H   feeding supplement (PROSource TF)  90 mL Per Tube QID   heparin injection (subcutaneous)  5,000 Units Subcutaneous Q8H   insulin aspart  0-9 Units Subcutaneous Q4H   ipratropium-albuterol  3 mL Nebulization Q4H   mouth rinse  15 mL Mouth Rinse 10 times per day   mupirocin ointment  1 application Nasal BID   pantoprazole (PROTONIX) IV  40 mg Intravenous Daily   QUEtiapine  25 mg Per Tube QHS   Continuous Infusions:  sodium chloride     levETIRAcetam 1,000 mg (12/01/20 1025)     LOS: 3 days   Time spent: [place], MD [facility] To contact the  attending provider between 7A-7P or the covering provider during after hours 7P-7A, please log into the web site [email or web address] and access using universal Loop password for that web site. If you do not have the password, please call the hospital operator.  12/01/2020, 1:30 PM

## 2020-12-01 NOTE — Progress Notes (Signed)
[facility] Progress Note Patient Name: Veronica Horton DOB: 1960-06-25 MRN: [id number]   Date of Service  12/01/2020  HPI/Events of Note  Hypokalemia - K+ 3.3 and Creatinine = 2.19.  eICU Interventions  Will replace K+.     Intervention Category Major Interventions: Electrolyte abnormality - evaluation and management  Climmie Buelow [place] 12/01/2020, 6:22 AM

## 2020-12-01 NOTE — Progress Notes (Signed)
Notified E-Link of patient's lack of urine output this shift. Bladder scanned patient and got 182ml. Will bolus patient per MD's order and continue to monitor.

## 2020-12-01 NOTE — Progress Notes (Signed)
   Providing Compassionate, Quality Care - Together  NEUROSURGERY PROGRESS NOTE   S: No issues overnight. More alert this am  O: EXAM:  BP (!) 108/57 (BP Location: Right Arm)   Pulse (!) 44   Temp (!) 97.3 F (36.3 C) (Axillary)   Resp 10   Ht 5\' 6"  (1.676 m)   Wt 101.1 kg   SpO2 94%   BMI 35.97 kg/m   Sleepy, opens eyes to voice On trach collar, mouths words appropriately CNs grossly intact, face symmetric 5/5 BUE/BLE, moves all extremities equally, in restraints PERRLA   ASSESSMENT:  60 y.o. female with   Right parietal extra-axial mass likely meningioma with vasogenic edema Seizures, secondary to #1 Laryngeal cancer   PLAN: -Her creatinine unfortunately did not improve therefore we cannot obtain a CT soft tissue of the neck for evaluation of her laryngeal cancer.  Therefore we will continue medical management.  I will follow along during this admission.  If she has significant improvement then we will reevaluate her for possible craniotomy resection of meningioma.  Otherwise continue aggressive medical therapies -Continue antiepileptics -Continue steroids    Thank you for allowing me to participate in this patient's care.  Please do not hesitate to call with questions or concerns.   Elwin Sleight, [facility] Cell: [phone]

## 2020-12-02 ENCOUNTER — Other Ambulatory Visit: Payer: Self-pay | Admitting: Hematology and Oncology

## 2020-12-02 DIAGNOSIS — G40901 Epilepsy, unspecified, not intractable, with status epilepticus: Secondary | ICD-10-CM | POA: Diagnosis not present

## 2020-12-02 LAB — CBC
HCT: 30 % — ABNORMAL LOW (ref 36.0–46.0)
Hemoglobin: 10.1 g/dL — ABNORMAL LOW (ref 12.0–15.0)
MCH: 32.8 pg (ref 26.0–34.0)
MCHC: 33.7 g/dL (ref 30.0–36.0)
MCV: 97.4 fL (ref 80.0–100.0)
Platelets: 201 10*3/uL (ref 150–400)
RBC: 3.08 MIL/uL — ABNORMAL LOW (ref 3.87–5.11)
RDW: 17.2 % — ABNORMAL HIGH (ref 11.5–15.5)
WBC: 7.3 10*3/uL (ref 4.0–10.5)
nRBC: 0 % (ref 0.0–0.2)

## 2020-12-02 LAB — GLUCOSE, CAPILLARY
Glucose-Capillary: 162 mg/dL — ABNORMAL HIGH (ref 70–99)
Glucose-Capillary: 153 mg/dL — ABNORMAL HIGH (ref 70–99)
Glucose-Capillary: 138 mg/dL — ABNORMAL HIGH (ref 70–99)
Glucose-Capillary: 143 mg/dL — ABNORMAL HIGH (ref 70–99)
Glucose-Capillary: 125 mg/dL — ABNORMAL HIGH (ref 70–99)
Glucose-Capillary: 120 mg/dL — ABNORMAL HIGH (ref 70–99)

## 2020-12-02 LAB — COMPREHENSIVE METABOLIC PANEL
ALT: 14 U/L (ref 0–44)
AST: 29 U/L (ref 15–41)
Albumin: 2.6 g/dL — ABNORMAL LOW (ref 3.5–5.0)
Alkaline Phosphatase: 34 U/L — ABNORMAL LOW (ref 38–126)
Anion gap: 11 (ref 5–15)
BUN: 39 mg/dL — ABNORMAL HIGH (ref 6–20)
CO2: 26 mmol/L (ref 22–32)
Calcium: 9 mg/dL (ref 8.9–10.3)
Chloride: 99 mmol/L (ref 98–111)
Creatinine, Ser: 2.01 mg/dL — ABNORMAL HIGH (ref 0.44–1.00)
GFR, Estimated: 28 mL/min — ABNORMAL LOW (ref >60)
Glucose, Bld: 127 mg/dL — ABNORMAL HIGH (ref 70–99)
Potassium: 3.6 mmol/L (ref 3.5–5.1)
Sodium: 136 mmol/L (ref 135–145)
Total Bilirubin: 0.8 mg/dL (ref 0.3–1.2)
Total Protein: 6 g/dL — ABNORMAL LOW (ref 6.5–8.1)

## 2020-12-02 MED ORDER — LORAZEPAM 2 MG/ML IJ SOLN: 0.5000 mg | Freq: Four times a day (QID) | INTRAMUSCULAR | Status: DC | PRN

## 2020-12-02 MED ORDER — FUROSEMIDE 10 MG/ML IJ SOLN: 20.0000 mg | Freq: Every day | INTRAMUSCULAR | Status: DC

## 2020-12-02 MED ORDER — MIDODRINE HCL 5 MG PO TABS
5.0000 mg | ORAL_TABLET | Freq: Two times a day (BID) | ORAL | Status: DC
  Administered 2020-12-02 – 2020-12-10 (×16): 5 mg
  Filled 2020-12-02 (×16): qty 1

## 2020-12-02 MED ORDER — PANTOPRAZOLE SODIUM 40 MG PO PACK
40.0000 mg | PACK | Freq: Every day | ORAL | Status: DC
  Administered 2020-12-03 – 2020-12-13 (×11): 40 mg
  Filled 2020-12-02 (×11): qty 20

## 2020-12-02 MED ORDER — PANTOPRAZOLE SODIUM 40 MG PO TBEC: 40.0000 mg | DELAYED_RELEASE_TABLET | Freq: Every day | ORAL | Status: DC

## 2020-12-02 MED ORDER — MIDODRINE HCL 5 MG PO TABS: 5.0000 mg | ORAL_TABLET | Freq: Two times a day (BID) | ORAL | Status: DC

## 2020-12-02 MED ORDER — ACETAMINOPHEN 500 MG PO TABS
1000.0000 mg | ORAL_TABLET | Freq: Three times a day (TID) | ORAL | Status: AC | PRN
  Administered 2020-12-02 – 2020-12-05 (×5): 1000 mg
  Filled 2020-12-02 (×5): qty 2

## 2020-12-02 MED ORDER — SCOPOLAMINE 1 MG/3DAYS TD PT72
1.0000 | MEDICATED_PATCH | TRANSDERMAL | Status: DC
  Administered 2020-12-02 – 2020-12-11 (×4): 1.5 mg via TRANSDERMAL
  Filled 2020-12-02 (×4): qty 1

## 2020-12-02 MED ORDER — FUROSEMIDE 10 MG/ML IJ SOLN
20.0000 mg | Freq: Once | INTRAMUSCULAR | Status: AC
  Administered 2020-12-02: 20 mg via INTRAVENOUS
  Filled 2020-12-02: qty 2

## 2020-12-02 MED ORDER — ACETAMINOPHEN 500 MG PO TABS: 1000.0000 mg | ORAL_TABLET | Freq: Three times a day (TID) | ORAL | Status: DC | PRN

## 2020-12-02 MED ORDER — LORAZEPAM 2 MG/ML IJ SOLN: 0.5000 mg | Freq: Two times a day (BID) | INTRAMUSCULAR | Status: DC | PRN

## 2020-12-02 NOTE — Progress Notes (Signed)
SLP Cancellation Note  Patient Details Name: Veronica Horton MRN: [id number] DOB: [birth year]   Cancelled treatment:       Reason Eval/Treat Not Completed: Medical issues which prohibited therapy.  Pt with more frequent desaturation events per RN.  Oncology and Palliative care consults pending.  Recommend holding POs and PMV trials for now.  SLP will follow along for POC.  D/W Dr. Verlon Au.   Clariece Roesler L. Tivis Ringer, [place] Office number [phone] Pager [phone]    Juan Quam Laurice 12/02/2020, 10:59 AM

## 2020-12-02 NOTE — Progress Notes (Signed)
Tried calling sister per discussion with primary team Discussed that from cancer stand point, since there is concern for residual disease, she is not an excellent candidate for aggressive treatments, But at this time, we dont believe she is terminal from cancer stand point.  She certainly has many other co-morbidities which can complicate her current situation. I did explain to the sister that in general because of her co-morbidities, she may not do well with very aggressive treatments. I encouraged her to talk with her sister and discuss code status and goals of care in case she deteriorates She expressed understanding. Patient was not seen, no active malignancy related issue, admitted with meningioma related complications.  [place]

## 2020-12-02 NOTE — Progress Notes (Addendum)
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 Burr Medico) usually 3 L oxygen at home-diagnosed 11/13 2021 follows with Dr. Isaias Cowman             During admission 11/10 through 12/17 she had tracheotomy placed as well as a 74 French gastrostomy 03/22/2020 Developed stercoral ulcers, declined laryngectomy and started chemotherapy -Patient was promptly readmitted 12/27 after this admission with COPD exacerbation, aspiration pneumonia, high oxygen requirements there was concern at that admission of vasogenic edema,?  Meningioma patient refused MRI brain and did not follow-up Found to have acute R DVT and was not anticoagulated for the same because she refused an MRI at the time--known to have right parietal mass vasogenic edema new focal partial complex seizures and loaded with Keppra  Readmit by critical care medicine 11/29/2020 with generalized weakness and then developed generalized tonic-clonic seizures-loaded with 3 mg of Ativan, 10 mg of Decadron Loaded with Keppra and Decadron at [place] more focal seizures on transfer to [facility] Required intubation for ventilator support-  Found to have AKI (baseline BUNs/creatinine 21/1.3), procalcitonin less than 0.1 lactic acid 1.0, CT head redemonstrated R parietal mass mild calcification + extensive vasogenic edema--MRI brain = 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 2.6 with 3 mm leftward midline shift Her PEG tube was replaced 8/2 8/1 EEG performed = mild diffuse encephalopathy nonspecific   Weaned to trach collar 8/2 Developed oliguria 8/3, hypokalemia Developed worsening respiratory failure with desaturations but comes up quickly   Problem list  Generalized tonic-clonic seizures  likely secondary to meningioma Continue Keppra 1 g IV twice daily-Decadron 4 mg every 6 as per neurosurgery Encephalopathy secondary to neuroleptics, BZD's better since 8/3-patient is oriented and conversant as of 8/4 Continue to de-escalate controlled substances I will consult palliative care given multiple disease processes and high risk for decompensation although the hope is for her to improve-- I shared a very realistic discussion with her at the bedside today  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 at this time 2/2 hypoxia CXR 8/3 shows effusions both sides, she is 5 liters +--careful lasix 20 qd IV watching renal funx   DVT 03/2020 Patient is at very high risk for hemorrhagic transformation-Eliquis from admission held Would not anticoagulate at this time  Meningioma brain 3X4X 3 cm Confirmed on MRI which was ultimately obtained this admission-see above discussion EEG did not show any specific  AKI on admission etiology unclear hypovolemic VS obstructive Hypokalemia, oliguria 600 cc out with in and out catheterization-reiterated that Foley catheter needs to be indwelling Urine sodium 11 urine creatinine 155-Fina 0.11 indicating however prerenal?  Hypovolemic etiology-if no improvement we will asked nephrology for an opinion  Chronic hypotension Continue midodrine 5 mg twice daily  Impaired glucose tolerance secondary to steroids  DVT prophylaxis: Heparin Code Status: Full  Family Communication: Long discussion with patient's Sister [name] on the telephone [phone] had a detailed discussion about her progress to date and although she has made some cognitive improvements her kidneys remain in the state of AKI and I explained this in layman's terms to the patient's family member I have also  explained that  several other consultants will need to be involved in discussion about the same Disposition:  Status is: Inpatient  Remains inpatient appropriate because:Ongoing active pain requiring inpatient pain management, Ongoing diagnostic testing needed not appropriate for outpatient work up, and Unsafe d/c plan  Dispo: The patient is from: Home              Anticipated d/c is to:  Unclear at this time              Patient currently is not medically stable to d/c.   Difficult to place patient No  Care coordination separate from patient care 35 minutes  discussed with Dr. Reatha Armour --not currently given high oxygen demands and appropriate patient for surgery hopefully this will improve The patient's ENT Dr. Edwyna Ready and I discussed her course of care and if the patient has a total laryngectomy this is a survivable cancer and I appreciate her comments as well as Dr. Rob Hickman comments     Consultants:  Neurosurgery Critical care medicine Alerted her oncologist  Procedures:   Antimicrobials:     Subjective: More coherent awake alert no distress Events overnight noted was hypoxic and Ativan and morphine were held and I have adjusted them further today and discontinued all opiates and I am just using Tylenol She desats when I put the Passy-Muir valve on and although she is able to phonate, she seems tired I had a long chat at the bedside in the presence of the nurse with regards to need for overall goals of care discussion in tandem with curative options -she is interested at this juncture in taking care of the meningioma-she shares with me that she was hesitant at taking care of this at prior hospitalization because she was scared  Objective: Vitals:   12/02/20 1000 12/02/20 1020 12/02/20 1050 12/02/20 1100  BP: (!) 93/50 (!) 88/49 (!) 105/53 (!) 93/56  Pulse: (!) 49 (!) 50 (!) 54 (!) 50  Resp: $Remo'13 12 14 13  'NXLUB$ Temp:      TempSrc:      SpO2: 90% 91% 92% 92%  Weight:      Height:         Intake/Output Summary (Last 24 hours) at 12/02/2020 1114 Last data filed at 12/02/2020 1100 Gross per 24 hour  Intake 1965 ml  Output 1635 ml  Net 330 ml    Filed Weights   11/28/20 1558 11/29/20 1530 11/30/20 1200  Weight: 96.2 kg 100.8 kg 101.1 kg    Examination:  More coherent awake alert Slightly increased work of breathing compared to yesterday Poor exam to chest S1-S2 slightly tachycardic sinus rhythm Abdomen soft no rebound no guarding No lower extremity edema Trach in place Able to move limbs with moderate power  Data Reviewed: personally reviewed   CBC    Component Value Date/Time   WBC 7.3 12/02/2020 0311   RBC 3.08 (L) 12/02/2020 0311   HGB 10.1 (L) 12/02/2020 0311   HGB 9.8 (L) 05/10/2020 1328   HCT 30.0 (L) 12/02/2020 0311   PLT 201 12/02/2020 0311   PLT 179 05/10/2020 1328   MCV 97.4 12/02/2020 0311   MCH 32.8 12/02/2020 0311   MCHC 33.7 12/02/2020 0311   RDW 17.2 (H) 12/02/2020 0311   LYMPHSABS 0.7 11/28/2020 1744   MONOABS 0.4 11/28/2020 1744   EOSABS 0.1 11/28/2020 1744   BASOSABS 0.0 11/28/2020 1744   CMP Latest Ref Rng & Units 12/02/2020 12/01/2020 11/30/2020  Glucose 70 - 99 mg/dL  127(H) 143(H) 114(H)  BUN 6 - 20 mg/dL 39(H) 33(H) 23(H)  Creatinine 0.44 - 1.00 mg/dL 2.01(H) 2.19(H) 2.16(H)  Sodium 135 - 145 mmol/L 136 138 137  Potassium 3.5 - 5.1 mmol/L 3.6 3.3(L) 3.9  Chloride 98 - 111 mmol/L 99 98 96(L)  CO2 22 - 32 mmol/L $RemoveB'26 26 29  'cINTFLqd$ Calcium 8.9 - 10.3 mg/dL 9.0 9.2 9.3  Total Protein 6.5 - 8.1 g/dL 6.0(L) - -  Total Bilirubin 0.3 - 1.2 mg/dL 0.8 - -  Alkaline Phos 38 - 126 U/L 34(L) - -  AST 15 - 41 U/L 29 - -  ALT 0 - 44 U/L 14 - -     Radiology Studies: IR REPLACE G-TUBE SIMPLE WO FLUORO  Result Date: 11/30/2020 INDICATION: History of dysphagia s/p 16 Fr balloon retention gastrostomy placement 03/22/20 in IR found to have dislodged G tube with 16 Fr Foley currently in tract. Request for bedside replacement of balloon retention  gastrostomy tube. EXAM: BEDSIDE REPLACEMENT OF GASTROSTOMY TUBE COMPARISON:  None. MEDICATIONS: Viscous lidocaine CONTRAST:  None FLUOROSCOPY TIME:  None COMPLICATIONS: None immediate. PROCEDURE: The balloon of the existing 16 Fr Foley within the gastrostomy tract was deflated and the Foley was easily removed. The 16 Fr balloon gastrostomy tube was lubricated with viscous lidocaine and inserted into the existing tract without difficulty. The balloon was inflated with normal saline and pulled against the anterior inner lumen of the stomach, the external disc was cinched to the skin. The gastrostomy tube was flushed easily with 10 cc normal saline and gastric contents aspirated to confirm positioning. IMPRESSION: Successful bedside replacement of a new 16-French gastrostomy tube. The gastrostomy tube is ready for immediate use. Read by Candiss Norse, PA-C Electronically Signed   By: Michaelle Birks MD   On: 11/30/2020 12:06   DG CHEST PORT 1 VIEW  Result Date: 12/01/2020 CLINICAL DATA:  Hypoxia, tracheostomy EXAM: PORTABLE CHEST 1 VIEW COMPARISON:  11/28/2020 FINDINGS: Single frontal view of the chest demonstrates stable tracheostomy tube. There has been interval progression of the left basilar consolidation seen on prior x-ray. Bilateral pleural effusions have developed as well, left greater than right. No pneumothorax. No acute bony abnormalities. IMPRESSION: 1. Progressive left basilar consolidation, with interval development of bilateral pleural effusions. Electronically Signed   By: Randa Ngo M.D.   On: 12/01/2020 21:27   DG Swallowing Func-Speech Pathology  Result Date: 12/01/2020 Formatting of this result is different from the original. Objective Swallowing Evaluation: Type of Study: MBS-Modified Barium Swallow Study  Patient Details Name: CLELIA TRABUCCO MRN: [id number] Date of Birth: [birth year] Today's Date: 12/01/2020 Time: SLP Start Time (ACUTE ONLY): 0930 -SLP Stop Time (ACUTE ONLY): 1000 SLP Time  Calculation (min) (ACUTE ONLY): 30 min Past Medical History: Past Medical History: Diagnosis Date  Bronchitis   Class 3 obesity 01/23/2020  COPD (chronic obstructive pulmonary disease) ([place])   Degenerative disc disease, lumbar   Hiatal hernia   Hypertension   Throat cancer [facility])  Past Surgical History: Past Surgical History: Procedure Laterality Date  BIOPSY  04/12/2020  Procedure: BIOPSY;  Surgeon: Wilford Corner, MD;  Location: [facility];  Service: Gastroenterology;;  CHOLECYSTECTOMY    degenerative bone disease    DIRECT LARYNGOSCOPY N/A 03/13/2020  Procedure: DIRECT LARYNGOSCOPY WITH BIOPSY;  Surgeon: Jason Coop, DO;  Location: [place];  Service: ENT;  Laterality: N/A;  FLEXIBLE SIGMOIDOSCOPY N/A 04/12/2020  Procedure: Beryle Quant;  Surgeon: Wilford Corner, MD;  Location: [facility];  Service: Gastroenterology;  Laterality: N/A;  INCISIONAL HERNIA REPAIR N/A 01/15/2019  Procedure: [name] HERNIORRHAPHY WITH MESH;  Surgeon: [name], MD;  Location: [facility];  Service: General;  Laterality: N/A;  IR GASTROSTOMY TUBE MOD SED  03/22/2020  IR [place] TUBE CHANGE  07/24/2020  IR RADIOLOGIST EVAL & MGMT  05/07/2020  IR REPLACE G-TUBE SIMPLE WO FLUORO  11/30/2020  OMENTECTOMY N/A 01/15/2019  Procedure: OMENTECTOMY;  Surgeon: [name], MD;  Location: [facility];  Service: General;  Laterality: N/A;  POLYPECTOMY  04/12/2020  Procedure: POLYPECTOMY;  Surgeon: Wilford Corner, MD;  Location: [facility];  Service: Gastroenterology;;  TRACHEOSTOMY TUBE PLACEMENT N/A 03/13/2020  Procedure: AWAKE TRACHEOSTOMY;  Surgeon: Jason Coop, DO;  Location: [place];  Service: ENT;  Laterality: N/A; HPI: 60 year old woman with PMHx significant for laryngeal squamous cell carcinoma (s/p trach on 3L trach collar at home), chronic PEG tube, COPD, HTN, obesity who presented to the ED with generalized weakness and then experienced generalized tonic-clonic shaking with concern for seizure activity. Of note, she  was admitted with aspiration PNA 03/2020 and CT Head at that time showed extensive vasogenic edema R parietal/occipital region with concern for meningioma.  Patient refused an MRI at that time and was to follow with neurosurgery outpatient; she states that she did not follow up for insurance reasons.  CT head shows redemonstrated mass in the R parietal lobe with extensive vasogenic edema throughout the R parietal lobe. 8/1 vent via chronic trach; 8/2 transitioned to ATC.  Agitated and self-removed PEG 8/2. Completed radiation and chemo 05/31/20. Last PET 6/22 with decreased size of supraglottic mass.  Pt has been followed by SLP during prior admissions, undergoing MBS and PMV evaluations.   Last MBS was11/27/21- dys3/thin liquids was recommended.  Subjective: quiet, follows commands Assessment / Plan / Recommendation CHL IP CLINICAL IMPRESSIONS 12/01/2020 Clinical Impression Pt's swallow function is mildly worse than when last assessed via MBS in December of 2021.  Presence of mass within larynx continues to interfere with laryngeal closure, allowing trace amounts of thin and nectar thick liquids to penetrate into the larynx and eventually pass below the vocal folds into the trachea.  Aspiration is trace - it did not elicit a cough response.  Presence of PMV did not improve airway protection.  Pt's Sp02 dropped from 88% to 83% during MBS - difficult to attribute impact of PMV to desaturation. 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 Recommend allowing purees/soft solids and honey-thick liquids for now since PEG can be used for primary nutrition.  Pt will benefit from further conversations about oral nutrition in the context of her chronic medical issues as well as current dx of meningioma. Doubt she will be satisfied with diet restrictions in the long-term.  D/W RN. SLP Visit Diagnosis Dysphagia, pharyngeal phase (R13.13)  Attention and concentration deficit following -- Frontal lobe and executive function deficit following -- Impact on safety and function Mild aspiration risk   CHL IP TREATMENT RECOMMENDATION 12/01/2020 Treatment Recommendations Therapy as outlined in treatment plan below   Prognosis 12/01/2020 Prognosis for Safe Diet Advancement Fair Barriers to Reach Goals -- Barriers/Prognosis Comment -- CHL IP DIET RECOMMENDATION 12/01/2020 SLP Diet Recommendations Dysphagia 1 (Puree) solids;Honey thick liquids Liquid Administration via Cup Medication Administration Via alternative means Compensations Small sips/bites Postural Changes Seated upright at 90 degrees   CHL IP OTHER RECOMMENDATIONS 12/01/2020 Recommended Consults -- Oral Care Recommendations Oral care BID Other Recommendations Order thickener from pharmacy  CHL IP FOLLOW UP RECOMMENDATIONS 12/01/2020 Follow up Recommendations Other (comment)   CHL IP FREQUENCY AND DURATION 12/01/2020 Speech Therapy Frequency (ACUTE ONLY) min 2x/week Treatment Duration 1 week      CHL IP ORAL PHASE 12/01/2020 Oral Phase WFL Oral - Pudding Teaspoon -- Oral - Pudding Cup -- Oral - Honey Teaspoon -- Oral - Honey Cup -- Oral - Nectar Teaspoon -- Oral - Nectar Cup -- Oral - Nectar Straw -- Oral - Thin Teaspoon -- Oral - Thin Cup -- Oral - Thin Straw -- Oral - Puree -- Oral - Mech Soft -- Oral - Regular -- Oral - Multi-Consistency -- Oral - Pill -- Oral Phase - Comment --  CHL IP PHARYNGEAL PHASE 12/01/2020 Pharyngeal Phase Impaired Pharyngeal- Pudding Teaspoon -- Pharyngeal -- Pharyngeal- Pudding Cup -- Pharyngeal -- Pharyngeal- Honey Teaspoon -- Pharyngeal -- Pharyngeal- Honey Cup -- Pharyngeal -- Pharyngeal- Nectar Teaspoon -- Pharyngeal -- Pharyngeal- Nectar Cup -- Pharyngeal -- Pharyngeal- Nectar Straw Reduced anterior laryngeal mobility;Reduced laryngeal elevation;Reduced airway/laryngeal closure;Penetration/Aspiration during swallow;Trace aspiration Pharyngeal Material enters airway, passes BELOW  cords without attempt by patient to eject out (silent aspiration) Pharyngeal- Thin Teaspoon -- Pharyngeal -- Pharyngeal- Thin Cup Reduced anterior laryngeal mobility;Reduced laryngeal elevation;Reduced airway/laryngeal closure;Penetration/Aspiration during swallow Pharyngeal Material enters airway, passes BELOW cords without attempt by patient to eject out (silent aspiration) Pharyngeal- Thin Straw Reduced anterior laryngeal mobility;Reduced laryngeal elevation;Reduced airway/laryngeal closure;Penetration/Aspiration during swallow Pharyngeal Material enters airway, passes BELOW cords without attempt by patient to eject out (silent aspiration) Pharyngeal- Puree WFL Pharyngeal -- Pharyngeal- Mechanical Soft -- Pharyngeal -- Pharyngeal- Regular -- Pharyngeal -- Pharyngeal- Multi-consistency -- Pharyngeal -- Pharyngeal- Pill -- Pharyngeal -- Pharyngeal Comment --  CHL IP CERVICAL ESOPHAGEAL PHASE 04/26/2020 Cervical Esophageal Phase -- Pudding Teaspoon -- Pudding Cup -- Honey Teaspoon -- Honey Cup -- Nectar Teaspoon -- Nectar Cup -- Nectar Straw -- Thin Teaspoon -- Thin Cup Prominent cricopharyngeal segment Thin Straw -- Puree -- Mechanical Soft -- Regular -- Multi-consistency -- Pill -- Cervical Esophageal Comment no backflow observed during SLP MBS Juan Quam Laurice 12/01/2020, 11:19 AM                Scheduled Meds:  arformoterol  15 mcg Nebulization BID   budesonide  0.5 mg Nebulization BID   chlorhexidine gluconate (MEDLINE KIT)  15 mL Mouth Rinse BID   Chlorhexidine Gluconate Cloth  6 each Topical Q0600   dexamethasone (DECADRON) injection  4 mg Intravenous Q6H   escitalopram  5 mg Per Tube Daily   feeding supplement (PROSource TF)  90 mL Per Tube BID   free water  150 mL Per Tube Q4H   heparin injection (subcutaneous)  5,000 Units Subcutaneous Q8H   insulin aspart  0-9 Units Subcutaneous Q4H   ipratropium-albuterol  3 mL Nebulization TID   mouth rinse  15 mL Mouth Rinse 10 times per day    mupirocin ointment  1 application Nasal BID   pantoprazole (PROTONIX) IV  40 mg Intravenous Daily   Continuous Infusions:  sodium chloride     feeding supplement (JEVITY 1.5 CAL/FIBER) 1,000 mL (12/01/20 1642)   levETIRAcetam Stopped (12/02/20 0943)     LOS: 4 days   Time spent: [place], MD [facility] To contact the attending provider between 7A-7P or the covering provider during after hours 7P-7A, please log into the web site [email or web address] and access using universal [place] password for that web site. If you do not have the password, please call the hospital  operator.  12/02/2020, 11:14 AM

## 2020-12-02 NOTE — Progress Notes (Signed)
   12/02/20 1020  Provider Notification  Provider Name/Title J. Samtani MD  Date Provider Notified 12/02/20  Time Provider Notified 1029  Notification Type Page  Notification Reason Other (Comment) (BP 88/49 (62))  Provider response No new orders (continue to watch at this time)  Date of Provider Response 12/02/20  Time of Provider Response 1031

## 2020-12-03 DIAGNOSIS — D329 Benign neoplasm of meninges, unspecified: Secondary | ICD-10-CM

## 2020-12-03 DIAGNOSIS — R0902 Hypoxemia: Secondary | ICD-10-CM

## 2020-12-03 DIAGNOSIS — G40901 Epilepsy, unspecified, not intractable, with status epilepticus: Secondary | ICD-10-CM | POA: Diagnosis not present

## 2020-12-03 DIAGNOSIS — Z7189 Other specified counseling: Secondary | ICD-10-CM

## 2020-12-03 DIAGNOSIS — Z515 Encounter for palliative care: Secondary | ICD-10-CM | POA: Diagnosis not present

## 2020-12-03 LAB — COMPREHENSIVE METABOLIC PANEL
ALT: 26 U/L (ref 0–44)
AST: 42 U/L — ABNORMAL HIGH (ref 15–41)
Albumin: 2.6 g/dL — ABNORMAL LOW (ref 3.5–5.0)
Alkaline Phosphatase: 31 U/L — ABNORMAL LOW (ref 38–126)
Anion gap: 11 (ref 5–15)
BUN: 45 mg/dL — ABNORMAL HIGH (ref 6–20)
CO2: 25 mmol/L (ref 22–32)
Calcium: 8.5 mg/dL — ABNORMAL LOW (ref 8.9–10.3)
Chloride: 101 mmol/L (ref 98–111)
Creatinine, Ser: 1.94 mg/dL — ABNORMAL HIGH (ref 0.44–1.00)
GFR, Estimated: 29 mL/min — ABNORMAL LOW (ref >60)
Glucose, Bld: 141 mg/dL — ABNORMAL HIGH (ref 70–99)
Potassium: 3.7 mmol/L (ref 3.5–5.1)
Sodium: 137 mmol/L (ref 135–145)
Total Bilirubin: 0.8 mg/dL (ref 0.3–1.2)
Total Protein: 5.6 g/dL — ABNORMAL LOW (ref 6.5–8.1)

## 2020-12-03 LAB — GLUCOSE, CAPILLARY
Glucose-Capillary: 162 mg/dL — ABNORMAL HIGH (ref 70–99)
Glucose-Capillary: 141 mg/dL — ABNORMAL HIGH (ref 70–99)
Glucose-Capillary: 105 mg/dL — ABNORMAL HIGH (ref 70–99)
Glucose-Capillary: 124 mg/dL — ABNORMAL HIGH (ref 70–99)
Glucose-Capillary: 149 mg/dL — ABNORMAL HIGH (ref 70–99)

## 2020-12-03 LAB — CBC
HCT: 30.8 % — ABNORMAL LOW (ref 36.0–46.0)
Hemoglobin: 10.1 g/dL — ABNORMAL LOW (ref 12.0–15.0)
MCH: 32.4 pg (ref 26.0–34.0)
MCHC: 32.8 g/dL (ref 30.0–36.0)
MCV: 98.7 fL (ref 80.0–100.0)
Platelets: 160 10*3/uL (ref 150–400)
RBC: 3.12 MIL/uL — ABNORMAL LOW (ref 3.87–5.11)
RDW: 17.2 % — ABNORMAL HIGH (ref 11.5–15.5)
WBC: 7.3 10*3/uL (ref 4.0–10.5)
nRBC: 0 % (ref 0.0–0.2)

## 2020-12-03 LAB — CULTURE, BLOOD (ROUTINE X 2)
Culture: NO GROWTH
Culture: NO GROWTH
Special Requests: ADEQUATE
Special Requests: ADEQUATE

## 2020-12-03 MED ORDER — FUROSEMIDE 10 MG/ML IJ SOLN
20.0000 mg | Freq: Two times a day (BID) | INTRAMUSCULAR | Status: DC
  Administered 2020-12-03 – 2020-12-07 (×7): 20 mg via INTRAVENOUS
  Filled 2020-12-03 (×7): qty 4
  Filled 2020-12-03: qty 2

## 2020-12-03 MED ORDER — DEXAMETHASONE 4 MG PO TABS
4.0000 mg | ORAL_TABLET | Freq: Three times a day (TID) | ORAL | Status: DC
  Administered 2020-12-03 – 2020-12-13 (×31): 4 mg
  Filled 2020-12-03 (×31): qty 1

## 2020-12-03 MED ORDER — LIDOCAINE 5 % EX PTCH
1.0000 | MEDICATED_PATCH | CUTANEOUS | Status: DC
  Administered 2020-12-03 – 2020-12-08 (×6): 1 via TRANSDERMAL
  Filled 2020-12-03 (×7): qty 1

## 2020-12-03 MED ORDER — LEVETIRACETAM 100 MG/ML PO SOLN
750.0000 mg | Freq: Two times a day (BID) | ORAL | Status: DC
  Administered 2020-12-03 – 2020-12-13 (×20): 750 mg
  Filled 2020-12-03 (×20): qty 10

## 2020-12-03 MED ORDER — FOOD THICKENER (SIMPLYTHICK HONEY)
10.0000 | ORAL | Status: DC | PRN
  Filled 2020-12-03: qty 10

## 2020-12-03 MED ORDER — IPRATROPIUM-ALBUTEROL 0.5-2.5 (3) MG/3ML IN SOLN
3.0000 mL | Freq: Four times a day (QID) | RESPIRATORY_TRACT | Status: DC | PRN
  Administered 2020-12-12: 3 mL via RESPIRATORY_TRACT
  Filled 2020-12-03: qty 3

## 2020-12-03 MED ORDER — CHLORHEXIDINE GLUCONATE CLOTH 2 % EX PADS
6.0000 | MEDICATED_PAD | Freq: Every day | CUTANEOUS | Status: DC
  Administered 2020-12-04 – 2020-12-12 (×9): 6 via TOPICAL

## 2020-12-03 NOTE — Progress Notes (Signed)
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  Meningioma patient refused MRI brain and did not follow-up Found to have acute R DVT and was not anticoagulated for the same because she refused an MRI at the time--known to have right parietal mass vasogenic edema new focal partial complex seizures and loaded with Keppra  Readmit by critical care medicine 11/29/2020 with generalized weakness and then developed generalized tonic-clonic seizures-loaded with 3 mg of Ativan, 10 mg of Decadron Loaded with Keppra and Decadron at [place] more focal seizures on transfer to [facility] Required intubation for ventilator support-  Found to have AKI (baseline BUNs/creatinine 21/1.3), procalcitonin less than 0.1 lactic acid 1.0, CT head redemonstrated R parietal mass mild calcification + extensive vasogenic edema--MRI brain = 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  Transition Keppra, Decadron to p.o. 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 at this time 2/2 hypoxia--- hypoxia has improved since 8/4 therefore try to wean rate and oxygen concentration in the next 24 hours CXR 8/3 shows effusions both sides, she is 5.1 liters + therefore will increase Lasix to 20 IV bd  DVT 03/2020 Patient is at very high risk for hemorrhagic transformation-Eliquis from admission held Would not anticoagulate at this time  Meningioma brain 3X4X 3 cm Confirmed on MRI which was ultimately obtained this admission-see above discussion Not currently a candidate for neurosurgery per Veronica Horton who I discussed this with on 8/4-needs to stabilize more--- will need rediscussion with neurosurgery probably after the weekend EEG did not show any specific  AKI/ATN 2/2 hypovolemia + obstructive pathology requires indwelling Foley catheter after was found to be retaining on 8/3 Urine sodium 11 urine creatinine 155-Fina 0.11  Creatinine steadily improving Continue to monitor trends and hope for improvement with hemodynamics renal function will continue to improve  Chronic hypotension Continue midodrine 5 mg twice daily--pressure seem improved overall  Impaired glucose tolerance secondary to steroids--cover with SSI for now  DVT prophylaxis: Heparin Code Status: Full  Family Communication: Long discussion with [name] on the telephone [phone] on  8/4 Palliative consult pending See my  detailed discussion with consultants on 8/4 below Disposition:  Status is: Inpatient  Remains inpatient appropriate because:Ongoing active pain requiring inpatient pain management, Ongoing diagnostic testing needed not appropriate for outpatient work up, and Unsafe d/c plan  Dispo: The patient is from: Home              Anticipated d/c is to:  Unclear at this time              Patient currently is not medically stable to d/c.   Difficult to place patient No    8/4 discussed with Veronica Horton --not currently given high oxygen demands and appropriate patient for surgery hopefully this will improve The patient's ENT Veronica Horton and I discussed her course of care and if the patient has a total laryngectomy this is a survivable cancer and I appreciate her comments as well as Veronica Horton comments     Consultants:  Neurosurgery Critical care medicine Alerted her oncologist  Procedures:   Antimicrobials:     Subjective:  More awake coherent multiple issues with pain but responsive to Tylenol She is not in any specific distress and seems to have understood our conversation well based on RN input this morning-she asked about the hospices in her hometown area I have reassured her that her kidney function is getting better but we still may have ways to go and that we would just need to see how things go over the next several days  Objective: Vitals:   12/03/20 0400 12/03/20 0500 12/03/20 0513 12/03/20 0600  BP: (!) 141/69  (!) 123/57 (!) 112/54  Pulse: 68 (!) 46 (!) 43 (!) 50  Resp: _0 Temp: 99 F (37.2 C)     TempSrc: Oral     SpO2: 91% 93% 93% 91%  Weight:      Height:        Intake/Output Summary (Last 24 hours) at 12/03/2020 0654 Last data filed at 12/03/2020 0600 Gross per 24 hour  Intake 2280 ml  Output 1535 ml  Net 745 ml    Filed Weights   11/28/20 1558 11/29/20 1530 11/30/20 1200  Weight: 96.2 kg 100.8 kg 101.1 kg     Examination:  Coherent pleasant mouthing words external ocular movements intact moving all 4 limbs equally with good prosody and praxis power is 5/5 trach is in place she is not D satting today Seems to have sinus bradycardia at times when sleeping but otherwise S1-S2 no murmur No lower extremity edema  Data Reviewed: personally reviewed   CBC    Component Value Date/Time   WBC 7.3 12/03/2020 0349   RBC 3.12 (L) 12/03/2020 0349   HGB 10.1 (L) 12/03/2020 0349   HGB 9.8 (L) 05/10/2020 1328   HCT 30.8 (L) 12/03/2020 0349   PLT 160 12/03/2020 0349   PLT 179 05/10/2020 1328   MCV 98.7 12/03/2020 0349   MCH 32.4 12/03/2020 0349   MCHC 32.8 12/03/2020 0349   RDW 17.2 (H) 12/03/2020 0349   LYMPHSABS 0.7 11/28/2020 1744   MONOABS 0.4 11/28/2020 1744   EOSABS 0.1 11/28/2020 1744   BASOSABS 0.0 11/28/2020 1744   CMP Latest Ref Rng & Units 12/03/2020 12/02/2020 12/01/2020  Glucose 70 - 99 mg/dL 141(H) 127(H) 143(H)  BUN 6 - 20 mg/dL 45(H) 39(H) 33(H)  Creatinine 0.44 - 1.00 mg/dL 1.94(H) 2.01(H) 2.19(H)  Sodium 135 - 145 mmol/L 137 136 138  Potassium 3.5 - 5.1 mmol/L 3.7 3.6 3.3(L)  Chloride 98 -  111 mmol/L 101 99 98  CO2 22 - 32 mmol/L _0 Calcium 8.9 - 10.3 mg/dL 8.5(L) 9.0 9.2  Total Protein 6.5 - 8.1 g/dL 5.6(L) 6.0(L) -  Total Bilirubin 0.3 - 1.2 mg/dL 0.8 0.8 -  Alkaline Phos 38 - 126 U/L 31(L) 34(L) -  AST 15 - 41 U/L 42(H) 29 -  ALT 0 - 44 U/L 26 14 -     Radiology Studies: DG CHEST PORT 1 VIEW  Result Date: 12/01/2020 CLINICAL DATA:  Hypoxia, tracheostomy EXAM: PORTABLE CHEST 1 VIEW COMPARISON:  11/28/2020 FINDINGS: Single frontal view of the chest demonstrates stable tracheostomy tube. There has been interval progression of the left basilar consolidation seen on prior x-ray. Bilateral pleural effusions have developed as well, left greater than right. No pneumothorax. No acute bony abnormalities. IMPRESSION: 1. Progressive left basilar consolidation, with interval  development of bilateral pleural effusions. Electronically Signed   By: Randa Ngo M.D.   On: 12/01/2020 21:27   DG Swallowing Func-Speech Pathology  Result Date: 12/01/2020 Formatting of this result is different from the original. Objective Swallowing Evaluation: Type of Study: MBS-Modified Barium Swallow Study  Patient Details Name: LOUISE RAWSON MRN: [id number] Date of Birth: [birth year] Today's Date: 12/01/2020 Time: SLP Start Time (ACUTE ONLY): 0930 -SLP Stop Time (ACUTE ONLY): 1000 SLP Time Calculation (min) (ACUTE ONLY): 30 min Past Medical History: Past Medical History: Diagnosis Date  Bronchitis   Class 3 obesity 01/23/2020  COPD (chronic obstructive pulmonary disease) ([place])   Degenerative disc disease, lumbar   Hiatal hernia   Hypertension   Throat cancer [facility])  Past Surgical History: Past Surgical History: Procedure Laterality Date  BIOPSY  04/12/2020  Procedure: BIOPSY;  Surgeon: Wilford Corner, MD;  Location: [facility];  Service: Gastroenterology;;  CHOLECYSTECTOMY    degenerative bone disease    DIRECT LARYNGOSCOPY N/A 03/13/2020  Procedure: DIRECT LARYNGOSCOPY WITH BIOPSY;  Surgeon: Jason Coop, DO;  Location: [place];  Service: ENT;  Laterality: N/A;  FLEXIBLE SIGMOIDOSCOPY N/A 04/12/2020  Procedure: Beryle Quant;  Surgeon: Wilford Corner, MD;  Location: [facility];  Service: Gastroenterology;  Laterality: N/A;  INCISIONAL HERNIA REPAIR N/A 01/15/2019  Procedure: [name] HERNIORRHAPHY WITH MESH;  Surgeon: [name], MD;  Location: [facility];  Service: General;  Laterality: N/A;  IR GASTROSTOMY TUBE MOD SED  03/22/2020  IR [place] TUBE CHANGE  07/24/2020  IR RADIOLOGIST EVAL & MGMT  05/07/2020  IR REPLACE G-TUBE SIMPLE WO FLUORO  11/30/2020  OMENTECTOMY N/A 01/15/2019  Procedure: OMENTECTOMY;  Surgeon: [name], MD;  Location: [facility];  Service: General;  Laterality: N/A;  POLYPECTOMY  04/12/2020  Procedure: POLYPECTOMY;  Surgeon: Wilford Corner, MD;  Location: [facility];  Service: Gastroenterology;;  TRACHEOSTOMY TUBE PLACEMENT N/A 03/13/2020  Procedure: AWAKE TRACHEOSTOMY;  Surgeon: Jason Coop, DO;  Location: [place];  Service: ENT;  Laterality: N/A; HPI: 60 year old woman with PMHx significant for laryngeal squamous cell carcinoma (s/p trach on 3L trach collar at home), chronic PEG tube, COPD, HTN, obesity who presented to the ED with generalized weakness and then experienced generalized tonic-clonic shaking with concern for seizure activity. Of note, she was admitted with aspiration PNA 03/2020 and CT Head at that time showed extensive vasogenic edema R parietal/occipital region with concern for meningioma.  Patient refused an MRI at that time and was to follow with neurosurgery outpatient; she states that she did not follow up for insurance reasons.  CT head shows redemonstrated mass in the R parietal lobe  with extensive vasogenic edema throughout the R parietal lobe. 8/1 vent via chronic trach; 8/2 transitioned to ATC.  Agitated and self-removed PEG 8/2. Completed radiation and chemo 05/31/20. Last PET 6/22 with decreased size of supraglottic mass.  Pt has been followed by SLP during prior admissions, undergoing MBS and PMV evaluations.   Last MBS was11/27/21- dys3/thin liquids was recommended.  Subjective: quiet, follows commands Assessment / Plan / Recommendation CHL IP CLINICAL IMPRESSIONS 12/01/2020 Clinical Impression Pt's swallow function is mildly worse than when last assessed via MBS in December of 2021.  Presence of mass within larynx continues to interfere with laryngeal closure, allowing trace amounts of thin and nectar thick liquids to penetrate into the larynx and eventually pass below the vocal folds into the trachea.  Aspiration is trace - it did not elicit a cough response.  Presence of PMV did not improve airway protection.  Pt's Sp02 dropped from 88% to 83% during MBS - difficult to attribute impact of PMV to desaturation. Pt has likely  experienced microaspiration since dx of laryngeal cancer, and dysphagia is unfortunately an iatrogenic effect of treatment. Recommend allowing purees/soft solids and honey-thick liquids for now since PEG can be used for primary nutrition.  Pt will benefit from further conversations about oral nutrition in the context of her chronic medical issues as well as current dx of meningioma. Doubt she will be satisfied with diet restrictions in the long-term.  D/W RN. SLP Visit Diagnosis Dysphagia, pharyngeal phase (R13.13) Attention and concentration deficit following -- Frontal lobe and executive function deficit following -- Impact on safety and function Mild aspiration risk   CHL IP TREATMENT RECOMMENDATION 12/01/2020 Treatment Recommendations Therapy as outlined in treatment plan below   Prognosis 12/01/2020 Prognosis for Safe Diet Advancement Fair Barriers to Reach Goals -- Barriers/Prognosis Comment -- CHL IP DIET RECOMMENDATION 12/01/2020 SLP Diet Recommendations Dysphagia 1 (Puree) solids;Honey thick liquids Liquid Administration via Cup Medication Administration Via alternative means Compensations Small sips/bites Postural Changes Seated upright at 90 degrees   CHL IP OTHER RECOMMENDATIONS 12/01/2020 Recommended Consults -- Oral Care Recommendations Oral care BID Other Recommendations Order thickener from pharmacy   CHL IP FOLLOW UP RECOMMENDATIONS 12/01/2020 Follow up Recommendations Other (comment)   CHL IP FREQUENCY AND DURATION 12/01/2020 Speech Therapy Frequency (ACUTE ONLY) min 2x/week Treatment Duration 1 week      CHL IP ORAL PHASE 12/01/2020 Oral Phase WFL Oral - Pudding Teaspoon -- Oral - Pudding Cup -- Oral - Honey Teaspoon -- Oral - Honey Cup -- Oral - Nectar Teaspoon -- Oral - Nectar Cup -- Oral - Nectar Straw -- Oral - Thin Teaspoon -- Oral - Thin Cup -- Oral - Thin Straw -- Oral - Puree -- Oral - Mech Soft -- Oral - Regular -- Oral - Multi-Consistency -- Oral - Pill -- Oral Phase - Comment --  CHL IP PHARYNGEAL  PHASE 12/01/2020 Pharyngeal Phase Impaired Pharyngeal- Pudding Teaspoon -- Pharyngeal -- Pharyngeal- Pudding Cup -- Pharyngeal -- Pharyngeal- Honey Teaspoon -- Pharyngeal -- Pharyngeal- Honey Cup -- Pharyngeal -- Pharyngeal- Nectar Teaspoon -- Pharyngeal -- Pharyngeal- Nectar Cup -- Pharyngeal -- Pharyngeal- Nectar Straw Reduced anterior laryngeal mobility;Reduced laryngeal elevation;Reduced airway/laryngeal closure;Penetration/Aspiration during swallow;Trace aspiration Pharyngeal Material enters airway, passes BELOW cords without attempt by patient to eject out (silent aspiration) Pharyngeal- Thin Teaspoon -- Pharyngeal -- Pharyngeal- Thin Cup Reduced anterior laryngeal mobility;Reduced laryngeal elevation;Reduced airway/laryngeal closure;Penetration/Aspiration during swallow Pharyngeal Material enters airway, passes BELOW cords without attempt by patient to eject out (silent aspiration) Pharyngeal- Thin Straw  Reduced anterior laryngeal mobility;Reduced laryngeal elevation;Reduced airway/laryngeal closure;Penetration/Aspiration during swallow Pharyngeal Material enters airway, passes BELOW cords without attempt by patient to eject out (silent aspiration) Pharyngeal- Puree WFL Pharyngeal -- Pharyngeal- Mechanical Soft -- Pharyngeal -- Pharyngeal- Regular -- Pharyngeal -- Pharyngeal- Multi-consistency -- Pharyngeal -- Pharyngeal- Pill -- Pharyngeal -- Pharyngeal Comment --  CHL IP CERVICAL ESOPHAGEAL PHASE 04/26/2020 Cervical Esophageal Phase -- Pudding Teaspoon -- Pudding Cup -- Honey Teaspoon -- Honey Cup -- Nectar Teaspoon -- Nectar Cup -- Nectar Straw -- Thin Teaspoon -- Thin Cup Prominent cricopharyngeal segment Thin Straw -- Puree -- Mechanical Soft -- Regular -- Multi-consistency -- Pill -- Cervical Esophageal Comment no backflow observed during SLP MBS Juan Quam Laurice 12/01/2020, 11:19 AM                Scheduled Meds:  arformoterol  15 mcg Nebulization BID   budesonide  0.5 mg Nebulization BID    chlorhexidine gluconate (MEDLINE KIT)  15 mL Mouth Rinse BID   Chlorhexidine Gluconate Cloth  6 each Topical Daily   dexamethasone (DECADRON) injection  4 mg Intravenous Q6H   escitalopram  5 mg Per Tube Daily   feeding supplement (PROSource TF)  90 mL Per Tube BID   free water  150 mL Per Tube Q4H   furosemide  20 mg Intravenous BID   heparin injection (subcutaneous)  5,000 Units Subcutaneous Q8H   insulin aspart  0-9 Units Subcutaneous Q4H   ipratropium-albuterol  3 mL Nebulization TID   mouth rinse  15 mL Mouth Rinse 10 times per day   midodrine  5 mg Per Tube BID WC   mupirocin ointment  1 application Nasal BID   pantoprazole sodium  40 mg Per Tube Daily   scopolamine  1 patch Transdermal Q72H   Continuous Infusions:  sodium chloride     feeding supplement (JEVITY 1.5 CAL/FIBER) 1,000 mL (12/02/20 2108)   levETIRAcetam Stopped (12/03/20 0936)     LOS: 5 days   Time spent: [name], MD [facility] To contact the attending provider between 7A-7P or the covering provider during after hours 7P-7A, please log into the web site [email or web address] and access using universal [place] password for that web site. If you do not have the password, please call the hospital operator.  12/03/2020, 6:54 AM

## 2020-12-03 NOTE — Progress Notes (Signed)
Spoke with sister over phone, update given, aware of transfer. Report given to 3W RN, all belongings taken to [phone]. RT aware of transfer.

## 2020-12-03 NOTE — Progress Notes (Signed)
  Speech Language Pathology Treatment: Dysphagia;[employer] Speaking valve  Patient Details Name: Veronica Horton MRN: [id number] DOB: 04-Sep-1960 Today's Date: 12/03/2020 Time: 1615-1700 SLP Time Calculation (min) (ACUTE ONLY): 45 min  Assessment / Plan / Recommendation Clinical Impression  Pt transferred from ICU to 3W. Doing much better today- smiling and interactive. PMV was placed and pt's Sp02 hovered between 88-92%. Voice was hoarse, low volume, but functional and consistent with baseline.  Her significant other arrived - he doesn't feel comfortable training to use the PMV, so for now pt will need to be supervised by staff when valve is in use (given her tendency to desaturate).    Pt asking for something to eat.  She was provided with honey-thick liquids as recommended post-MBS on Wed. She did well with that consistency with no overt s/s of aspiration and seemed to derive some satisfaction from eating a little. Will resume dysphagia 1 diet, honey thick liquids per discussion with Dr. Verlon Au.  Pt encouraged to manage her own oral care - toothbrush/paste provided. New signage posted at [facility] and [place] D/W RN.  SLP will follow for PMV education (donning/doffing herself) and to determine potential to liberalize POs to thinner liquids.    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 Of note, she was admitted with aspiration PNA 03/2020 and CT Head at that time showed extensive vasogenic edema R parietal/occipital region with concern for meningioma.  Patient refused an MRI at that time and was to follow with neurosurgery outpatient; she states that she did not follow up for insurance reasons.  CT head shows redemonstrated mass in the R parietal  lobe with extensive vasogenic edema throughout the R parietal lobe. 8/1 vent via chronic trach; 8/2 transitioned to ATC.  Agitated and self-removed PEG 8/2. Completed radiation and chemo 05/31/20. Last PET 6/22 with decreased size of supraglottic mass.  Pt has been followed by SLP during prior admissions, undergoing MBS and PMV evaluations.   Last MBS was11/27/21- dys3/thin liquids was recommended. Recent MBS 8/3 with slightly deteriorated swallow and trace, silent aspiration of thin and nectar thick liquids.  Dys 1/honey thick recommended pending [place] conversations.      SLP Plan  Continue with current plan of care       Recommendations  Diet recommendations: Dysphagia 1 (puree);Honey-thick liquid Liquids provided via: Cup Medication Administration: Via alternative means Supervision: Staff to assist with self feeding Compensations: Small sips/bites Postural Changes and/or Swallow Maneuvers: Seated upright 90 degrees      Patient may use Passy-Muir Speech Valve: Intermittently with supervision PMSV Supervision: Full MD: Please consider changing trach tube to : Cuffless         Oral Care Recommendations: Oral care BID SLP Visit Diagnosis: Dysphagia, pharyngeal phase (R13.13) Plan: Continue with current plan of care       GO               Chaniah Cisse L. Tivis Ringer, [place] Office number [phone] Pager [phone]  Juan Quam Laurice 12/03/2020, 5:04 PM

## 2020-12-03 NOTE — Consult Note (Signed)
Palliative Care Consult Note                                  Date: 12/03/2020   Patient Name: Veronica Horton  DOB: March 22, 1961  MRN: [id number]  Age / Sex: 60 y.o., female  PCP: [facility] Referring Physician: Nita Sells, MD  Reason for Consultation: Establishing goals of care  HPI/Patient Profile: 60 y.o. 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  The patient declined laryngectomy previously.  Admitted on 11/28/2020 with complaints of generalized weakness and then developed generalized tonic-clonic seizures followed by more focal seizures on transfer to [facility] from [facility]  She required intubation and ventilator support, since been discontinued from the ventilator.  She was found to have a 3 cm x 4 cm x 3 cm meningioma of the brain with 3 mm leftward midline shift.  She underwent PEG tube replacement on 11/30/2020.  She had an EEG performed 8/1 which found mild diffuse encephalopathy, nonspecific.  She developed acute kidney injury with oliguria on 8/3.   She was transitioned to Keppra, Decadron and de-escalated from controlled substances which is helped her mentation significantly improved.  She remains n.p.o. at this time with tube feeds due to her screen cell carcinoma.  Recent outpatient evaluation of her cancer found improvement in size of tumor, but residual hypermetabolic carcinoma remains.  Also found bilateral effusions on chest x-ray 8/3 and started on diuretics.  She has been evaluated by neurosurgery Dr. Reatha Armour who indicates need further stabilization prior to consideration of craniotomy/resection of tumor, will readdress next week.  Creatinine steadily improving related to  her acute kidney injury.  Overall she is quite ill.  She has had multiple additional complications related to her cancer in addition to her chronic ongoing issues.  Palliative care was consulted to rediscuss goals of care.  Previous palliative discussion at [facility] with Dr. [name] at the time of her new diagnosis of laryngeal cancer at which point she wanted to be a full code and full aggressive care to try to get some improvement with chemotherapy.  Past Medical History:  Diagnosis Date  . Bronchitis   . Class 3 obesity 01/23/2020  . COPD (chronic obstructive pulmonary disease) ([place])   . Degenerative disc disease, lumbar   . Hiatal hernia   . Hypertension   . Throat cancer [facility])     Social History   Socioeconomic History  . Marital status: Divorced    Spouse name: Not on file  . Number of children: Not on file  . Years of education: Not on file  . Highest education level: Not on file  Occupational History  . Not on file  Tobacco Use  . Smoking status: Every Day    Packs/day: 1.00    Types: Cigarettes  . Smokeless tobacco: Never  Vaping Use  . Vaping Use: Never used  Substance and Sexual Activity  . Alcohol use: No  . Drug use: No  . Sexual activity: Yes    Birth control/protection: Post-menopausal  Other Topics Concern  . Not on file  Social History Narrative  . Not on file   Social Determinants of Health   Financial  Resource Strain: High Risk  . Difficulty of Paying Living Expenses: Very hard  Food Insecurity: Not on file  Transportation Needs: Not on file  Physical Activity: Not on file  Stress: Not on file  Social Connections: Not on file    Family History  Problem Relation Age of Onset  . Hypertension Mother   . Sudden Cardiac Death Neg Hx     Subjective:   This NP Walden Field reviewed medical records, received report from team, assessed the patient and then meet at the patient's bedside  to discuss diagnosis, prognosis, GOC, EOL  wishes disposition and options.   Concept of Palliative Care was introduced as specialized medical care for people and their families living with serious illness.  If focuses on providing relief from the symptoms and stress of a serious illness.  The goal is to improve quality of life for both the patient and the family. Values and goals of care important to patient and family were attempted to be elicited.  Created space and opportunity for patient  and family to explore thoughts and feelings regarding current medical situation   Natural trajectory and expectations at EOL were discussed. Questions and concerns addressed. Patient  encouraged to call with questions or concerns.    Life Review: The patient has a sister, [name], and his significant other, Consuella Lose, and his son Harrell Gave.  [name] and Milta Deiters are her powers of attorney.  She does still have contact with her son, who lives in [place].  She is close with her sister and significant other.  The patient enjoys sewing, such as cross-stitch and crochet.  She also enjoys word finders and crossword puzzles.  Patient Values: Things that are important to the patient include her family and her ability to interact and participate with them.  Additionally, it is important to her to try to get well.  Patient/Family Understanding of Illness: The patient understands she is quite sick.  She knows she has cancer and it is not gone, but better.  She was hopeful that she was improving.  However, she knows that she has a brain mass that might need surgery but she is not sure if they are going to do it.  She is asked me if they are going to.  I expressed that we would have to wait till early next week when neurosurgery can reevaluate.  She also notes that her teeth are bad and she needs to have them out before "something happens to me."  We discussed the risk of abscess and infection which could be significant for her, given her ongoing cancer and  possible need for further chemotherapy.  We had a frank discussion about her new healthcare challenges, in addition to the existing cancer and other healthcare issues.  We discussed how this presents a serious situation for her.  I discussed that she is a high risk for decompensation ("getting really sick, really quick").  She understands this.  Does not show that into our discussion about advance care planning as per below.  Review of Systems  Constitutional:  Positive for fatigue.  Respiratory:  Negative for shortness of breath.   Cardiovascular:  Negative for chest pain.  Gastrointestinal:  Negative for abdominal pain.   Objective:   Primary Diagnoses: Present on Admission: . Status epilepticus ([place])  Physical Exam Vitals and nursing note reviewed.  Constitutional:      General: She is not in acute distress.    Appearance: She is obese. She is ill-appearing.  Comments: Unable to speak due to trach  HENT:     Head: Normocephalic and atraumatic.  Neck:     Trachea: Tracheostomy present.  Cardiovascular:     Rate and Rhythm: Normal rate and regular rhythm.     Heart sounds: No murmur heard. Pulmonary:     Effort: Pulmonary effort is normal. No respiratory distress.  Abdominal:     General: Abdomen is protuberant.     Palpations: Abdomen is soft.     Comments: PEG tube in place to anterior abdomen with tube feeds running  Neurological:     General: No focal deficit present.     Mental Status: She is alert.  Psychiatric:        Mood and Affect: Mood normal.        Behavior: Behavior normal.    Vital Signs:  BP 124/64   Pulse (!) 58   Temp 98.5 F (36.9 C) (Oral)   Resp 18   Ht 5\' 6"  (1.676 m)   Wt 101.1 kg   SpO2 94%   BMI 35.97 kg/m  Pain Scale: 0-10   Pain Score: 5   SpO2: SpO2: 94 % O2 Device:SpO2: 94 % O2 Flow Rate: .O2 Flow Rate (L/min): 10 L/min  Palliative Assessment/Data: 50%   Advanced Care Planning:   Primary Decision  Maker: PATIENT  Code Status/Advance Care Planning: DNR  A discussion was had today regarding advanced directives. Concepts specific to code status, artifical feeding and hydration, continued IV antibiotics and rehospitalization was had.  The difference between a aggressive medical intervention path and a palliative comfort care path for this patient at this time was had. The MOST form was introduced and discussed. This was previously completed to indicate full code, full scope of care. This was marked for removal from the system due to change to "No Code". However, the patient still wants full scope of care/treatment otherwise.  Decisions/Changes to ACP: Change to DNR Full scope of treatment otherwise Confirmed patient's choices for HCPOA  Assessment & Plan:   I have reviewed the medical record, interviewed the patient and family, and examined the patient. The following aspects are pertinent.  Impression: Chronically ill 60 year old female with stage IV laryngeal carcinoma status postchemotherapy and radiation with tumor improvement, but persistent hypermetabolic cancerous tissue remains.  She has a PEG and trach.  She has had some difficulties with her trach.  PEG tube had to be replaced this hospital admission.  She presented with weakness that evolved into tonic-clonic seizures and more focal seizures.  She was initially admitted to the neuro ICU and has since been transferred to a MedSurg floor.  She remains on oxygen, tube feeds remain.  Her biggest issue right now is the meningioma as described above.  Neurosurgery has evaluated her and is not a candidate for surgery at this time given her kidney function.  However, plan to reevaluate next week for possible candidacy.  If she is not a candidate for surgery, or declined surgery, we would need to have further discussions related to goals.  SUMMARY OF RECOMMENDATIONS   Change to DNR Continue to treat the treatable Full scope of treatment  otherwise Re-assess goals as patient progresses  Symptom Management:  Per primary team Palliative is available to assist as needed  Palliative Prophylaxis:  Aspiration, Frequent Pain Assessment, and Oral Care  Additional Recommendations (Limitations, Scope, Preferences): Full Scope Treatment  Psycho-social/Spiritual:  Desire for further Chaplaincy support: yes Additional Recommendations: Caregiving  Support/Resources  Prognosis:  Unable  to determine  Discharge Planning:  To Be Determined   Discussed with: Patient, nursing staff, Dr. Verlon Au    Thank you for allowing [place] to participate in the care of MAKAIYAH SCHWEIGER PMT will continue to support holistically.  Time In: 2:15 Time Out: 4:00 Time Total: 105 min  Greater than 50%  of this time was spent counseling and coordinating care related to the above assessment and plan.  Signed by: Walden Field, NP Palliative Medicine Team  Team Phone # [phone] (Nights/Weekends)  12/03/2020, 3:16 PM

## 2020-12-04 DIAGNOSIS — Z515 Encounter for palliative care: Secondary | ICD-10-CM | POA: Diagnosis not present

## 2020-12-04 DIAGNOSIS — R569 Unspecified convulsions: Secondary | ICD-10-CM | POA: Diagnosis not present

## 2020-12-04 DIAGNOSIS — Z7189 Other specified counseling: Secondary | ICD-10-CM | POA: Diagnosis not present

## 2020-12-04 DIAGNOSIS — G40901 Epilepsy, unspecified, not intractable, with status epilepticus: Secondary | ICD-10-CM | POA: Diagnosis not present

## 2020-12-04 LAB — COMPREHENSIVE METABOLIC PANEL
ALT: 36 U/L (ref 0–44)
AST: 51 U/L — ABNORMAL HIGH (ref 15–41)
Albumin: 2.8 g/dL — ABNORMAL LOW (ref 3.5–5.0)
Alkaline Phosphatase: 32 U/L — ABNORMAL LOW (ref 38–126)
Anion gap: 8 (ref 5–15)
BUN: 47 mg/dL — ABNORMAL HIGH (ref 6–20)
CO2: 30 mmol/L (ref 22–32)
Calcium: 8.6 mg/dL — ABNORMAL LOW (ref 8.9–10.3)
Chloride: 100 mmol/L (ref 98–111)
Creatinine, Ser: 1.93 mg/dL — ABNORMAL HIGH (ref 0.44–1.00)
GFR, Estimated: 29 mL/min — ABNORMAL LOW (ref >60)
Glucose, Bld: 113 mg/dL — ABNORMAL HIGH (ref 70–99)
Potassium: 3.6 mmol/L (ref 3.5–5.1)
Sodium: 138 mmol/L (ref 135–145)
Total Bilirubin: 0.9 mg/dL (ref 0.3–1.2)
Total Protein: 5.9 g/dL — ABNORMAL LOW (ref 6.5–8.1)

## 2020-12-04 LAB — GLUCOSE, CAPILLARY
Glucose-Capillary: 110 mg/dL — ABNORMAL HIGH (ref 70–99)
Glucose-Capillary: 111 mg/dL — ABNORMAL HIGH (ref 70–99)
Glucose-Capillary: 114 mg/dL — ABNORMAL HIGH (ref 70–99)
Glucose-Capillary: 115 mg/dL — ABNORMAL HIGH (ref 70–99)
Glucose-Capillary: 115 mg/dL — ABNORMAL HIGH (ref 70–99)
Glucose-Capillary: 125 mg/dL — ABNORMAL HIGH (ref 70–99)
Glucose-Capillary: 136 mg/dL — ABNORMAL HIGH (ref 70–99)

## 2020-12-04 LAB — CBC
HCT: 30.9 % — ABNORMAL LOW (ref 36.0–46.0)
Hemoglobin: 10.2 g/dL — ABNORMAL LOW (ref 12.0–15.0)
MCH: 32.5 pg (ref 26.0–34.0)
MCHC: 33 g/dL (ref 30.0–36.0)
MCV: 98.4 fL (ref 80.0–100.0)
Platelets: 161 10*3/uL (ref 150–400)
RBC: 3.14 MIL/uL — ABNORMAL LOW (ref 3.87–5.11)
RDW: 17.1 % — ABNORMAL HIGH (ref 11.5–15.5)
WBC: 9.3 10*3/uL (ref 4.0–10.5)
nRBC: 0.2 % (ref 0.0–0.2)

## 2020-12-04 MED ORDER — LORAZEPAM 2 MG/ML IJ SOLN
1.0000 mg | Freq: Once | INTRAMUSCULAR | Status: AC
  Administered 2020-12-04: 1 mg via INTRAVENOUS
  Filled 2020-12-04: qty 1

## 2020-12-04 NOTE — Progress Notes (Addendum)
Daily Progress Note   Patient Name: Veronica Horton       Date: 12/04/2020 DOB: 1960/08/13  Age: 60 y.o. MRN#: [id number] Attending Physician: Veronica Sells, MD Primary Care Physician: Veronica Horton Date: 11/28/2020 Length of Stay: 6 days  Reason for Consultation/Follow-up: Establishing goals of care  HPI/Patient Profile:  60 y.o. female  with past medical history of morbid obesity (BMI 35), 35-pack-year smoking history, stage IV squamous cell carcinoma of Veronica larynx status post trach on 3 L oxygen at home, 16 [place] gastrostomy, sterile coral ulcers, status postchemotherapy and radiation.  Veronica patient declined laryngectomy previously.  Admitted on 11/28/2020 with complaints of generalized weakness and then developed generalized tonic-clonic seizures followed by more focal seizures on transfer to Veronica Horton from Veronica Horton.  She required intubation and ventilator support, since been discontinued from Veronica ventilator.  She was found to have a 3 cm x 4 cm x 3 cm meningioma of Veronica brain with 3 mm leftward midline shift.  She underwent PEG tube replacement on 11/30/2020.  She had an EEG performed 8/1 which found mild diffuse encephalopathy, nonspecific.  She developed acute kidney injury with oliguria on 8/3.   She was transitioned to Keppra, Decadron and de-escalated from controlled substances which is helped her mentation significantly improved.  She remains n.p.o. at this time with tube feeds due to her screen cell carcinoma.  Recent outpatient evaluation of her cancer found improvement in size of tumor, but residual hypermetabolic carcinoma remains.  Also found bilateral effusions on chest x-ray 8/3 and started on diuretics.  She has been evaluated by neurosurgery Dr. Reatha Horton who indicates need further stabilization prior to consideration of craniotomy/resection of tumor, will readdress next week.  Creatinine steadily improving related to her acute kidney injury.    Overall she is quite ill.  She has had multiple additional complications related to her cancer in addition to her chronic ongoing issues.  Palliative care was consulted to rediscuss goals of care.  Previous palliative discussion at [facility] with Dr. Micheline Horton at Veronica time of her new diagnosis of laryngeal cancer at which point she wanted to be a full code and full aggressive care to try to get some improvement with chemotherapy.  After discussion yesterday with acute on chronic health issues, she elected DNR status.  Subjective:   Subjective: Chart Reviewed. Updates received. Patient Assessed. Created space and opportunity for patient  and family to explore thoughts and feelings regarding current medical situation.  Today's Discussion: When I entered Veronica room Veronica patient was sleeping but easily awakens to voice. indicates she's doing ok this morning. Some mild abdominal discomfort, feels constipated. Hasn't had a bowel movement at least in Veronica past 2 days (none documented in Veronica chart since admission). Denies dyspnea or N/V. No other significant complaints.  ROS limited d/t trach w/o PMV Review of Systems  Respiratory:  Negative for shortness of breath.   Gastrointestinal:  Positive for abdominal pain (mild).   Objective:   Vital Signs: BP (!) 105/59 (BP Location: Left Arm)   Pulse (!) 56   Temp 97.9 F (36.6 C) (Oral)   Resp 18   Ht 5\' 6"  (1.676 m)   Wt 101.1 kg   SpO2 96%   BMI 35.97 kg/m  SpO2: SpO2: 96 % O2 Device: O2 Device: Tracheostomy Collar O2 Flow Rate: O2 Flow Rate (L/min): 8 L/min  Physical Exam: Physical Exam Vitals and nursing note reviewed.  Constitutional:  General: She is sleeping. She is not in acute distress.    Appearance: She is obese. She is ill-appearing. She is not toxic-appearing.  HENT:     Head: Normocephalic and atraumatic.  Cardiovascular:     Rate and Rhythm: Regular rhythm. Bradycardia present.  Pulmonary:     Effort:  Pulmonary effort is normal. No respiratory distress.  Abdominal:     General: Abdomen is protuberant.     Palpations: Abdomen is soft.  Skin:    General: Skin is warm and dry.  Neurological:     Mental Status: She is easily aroused.  Psychiatric:        Mood and Affect: Mood normal.        Behavior: Behavior normal.    SpO2: SpO2: 96 % O2 Device:SpO2: 96 % O2 Flow Rate: .O2 Flow Rate (L/min): 8 L/min  Palliative Assessment/Data: 40%   Assessment & Plan:   Impression:  Chronically ill 60 year old female with stage IV laryngeal carcinoma status postchemotherapy and radiation with tumor improvement, but persistent hypermetabolic cancerous tissue remains.  She has a PEG and trach.  She has had some difficulties with her trach.  PEG tube had to be replaced this Horton admission.  She presented with weakness that evolved into tonic-clonic seizures and more focal seizures.  She was initially admitted to Veronica neuro ICU and has since been transferred to a MedSurg floor.  She remains on oxygen, tube feeds remain.  Her biggest issue right now is Veronica meningioma as described above.  Neurosurgery has evaluated her and is not a candidate for surgery at this time given her kidney function.  However, plan to reevaluate next week for possible candidacy.  If she is not a candidate for surgery, or declined surgery, we would need to have further discussions related to goals.  Today may be having some constipation-related discomfort. Has prn Colace ordered, not given since admit. Requested RN to give a dose to see if she's able to have a bowel movement.  SUMMARY OF RECOMMENDATIONS   Remain DNR Continue to treat Veronica treatable Full scope of treatment otherwise Re-assess goals as patient progresses Give a prn dose of Colace, monitor for effect/need for other constipation meds  Code Status: DNR  Symptom Management: Per primary team Palliative is available to assist as needed  Prognosis: Unable to  determine  Discharge Planning: To Be Determined  Discussed with: Patient, nursing staff, medical staff  Thank you for allowing [place] to participate in Veronica care of Veronica Horton PMT will continue to support holistically.  Time Total: 30 mins  Visit consisted of counseling and education dealing with Veronica complex and emotionally intense issues of symptom management and palliative care in Veronica setting of serious and potentially life-threatening illness. Greater than 50%  of this time was spent counseling and coordinating care related to Veronica above assessment and plan.  Walden Field, NP Palliative Medicine Team  Team Phone # [phone] (Nights/Weekends)  12/04/2020, 8:54 AM

## 2020-12-04 NOTE — Progress Notes (Signed)
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 PET scan on 10/22/2020 reported decreased size of the mass in the supraglottic airway and hypopharynx particularly with respect to the left anterolateral aspect but with persistent marked hypermetabolic activity compatible with residual disease. Fiberoptic laryngoscopy on 07/29 demonstrated marked supraglottic edema and soft tissue abnormality concerning for residual disease. I discussed findings with patient at that time, and recommended referral to Head and Neck surgery in [facility] for discussion of salvage laryngectomy and bilateral neck dissection, to which she was amenable. This appointment has not yet been scheduled.   Clinical stability and decision regarding surgery for meningioma would need to be determined prior to consideration of salvage laryngeal surgery. Per documentation from Palliative care, patient is currently DNR and NS to re-evaluate next week for possible candidacy.   Thank you for allowing me to participate in the care of this patient. Please do not hesitate to contact me with any questions or concerns.   Jason Coop, [facility] Cell: [phone]

## 2020-12-04 NOTE — Progress Notes (Signed)
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 Burr Medico) usually 3 L oxygen at home-diagnosed 11/13 2021 follows with Dr. Isaias Cowman             During admission 11/10 through 12/17 she had tracheotomy placed as well as a 27 French gastrostomy 03/22/2020 Developed stercoral ulcers, declined laryngectomy and started chemotherapy -Patient was promptly readmitted 12/27 after this admission with COPD exacerbation, aspiration pneumonia, high oxygen requirements there was concern at that admission of vasogenic edema,?  Meningioma patient refused MRI brain and did not follow-up Found to have acute R DVT and was not anticoagulated for the same because she refused an MRI at the time--known to have right parietal mass vasogenic edema new focal partial complex seizures and loaded with Keppra  Readmit by critical care medicine 11/29/2020 with generalized weakness and then developed generalized tonic-clonic seizures-loaded with 3 mg of Ativan, 10 mg of Decadron Loaded with Keppra and Decadron at [place] more focal seizures on transfer to [facility] Required intubation for ventilator support-  Found to have AKI (baseline BUNs/creatinine 21/1.3), procalcitonin less than 0.1 lactic acid 1.0, CT head redemonstrated R parietal mass mild calcification + extensive vasogenic edema--MRI brain = 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 2.6 with 3 mm leftward midline shift Her PEG tube was replaced 8/2 8/1 EEG performed = mild diffuse encephalopathy nonspecific   Weaned to trach collar 8/2 Developed oliguria 8/3, hypokalemia Developed worsening respiratory failure with desaturations but comes up quickly   Problem list  Generalized tonic-clonic seizures  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 Reatha Armour follow-up who I discussed this with on 8/4---- Dr. Reatha Armour and Dr. Edwyna Ready aware EEG did not show any specific seizure pathology  AKI/ATN 2/2 hypovolemia + obstructive pathology requires indwelling Foley catheter after was found to be retaining on 8/3 Urine sodium 11 urine creatinine 155-Fina 0.11  Creatinine stabilized 1.9 range  Chronic hypotension Continue midodrine 5 mg twice daily--pressure seem improved overall  Impaired glucose tolerance secondary to steroids--cover with SSI for now  DVT prophylaxis: Heparin Code Status: Currently DNR after multiple goals of care discussion with patient and family Family Communication: Long discussion with patient's Sister [name] on the telephone [phone] on 8/4 Palliative consult  See my detailed discussion with consultants on 8/4 below Disposition:  Status is: Inpatient  Remains inpatient appropriate because:Ongoing  active pain requiring inpatient pain management, Ongoing diagnostic testing needed  not appropriate for outpatient work up, and Unsafe d/c plan  Dispo: The patient is from: Home              Anticipated d/c is to:  Unclear at this time              Patient currently is not medically stable to d/c.   Difficult to place patient No    8/4 discussed with Dr. Reatha Armour --not currently given high oxygen demands and appropriate patient for surgery hopefully this will improve The patient's ENT Dr. Edwyna Ready and I discussed her course of care and if the patient has a total laryngectomy this is a survivable cancer and I appreciate her comments as well as Dr. Rob Hickman comments     Consultants:  Neurosurgery Critical care medicine Alerted her oncologist  Procedures:   Antimicrobials:     Subjective:  Awake quiet some pain in abd Lidoderm patch not over incision--otherwise seems much better breathing well Oriented x 4   Objective: Vitals:   12/03/20 2324 12/03/20 2348 12/04/20 0345 12/04/20 0411  BP: 107/62  (!) 105/59   Pulse: (!) 50 (!) 53 (!) 50 (!) 51  Resp: _0 Temp: 97.8 F (36.6 C)  97.9 F (36.6 C)   TempSrc: Oral  Oral   SpO2: (!) 89% 91% 91% 91%  Weight:      Height:        Intake/Output Summary (Last 24 hours) at 12/04/2020 0740 Last data filed at 12/04/2020 0732 Gross per 24 hour  Intake 880 ml  Output 2700 ml  Net -1820 ml    Filed Weights   11/28/20 1558 11/29/20 1530 11/30/20 1200  Weight: 96.2 kg 100.8 kg 101.1 kg    Examination:  Thick neck mallampatti 4  Poor lung exam--clear anteriorly Abd soft--some pain around PEG--seems to be infusing feeds well No le edema Trach clean  Data Reviewed: personally reviewed   CBC    Component Value Date/Time   WBC 9.3 12/04/2020 0253   RBC 3.14 (L) 12/04/2020 0253   HGB 10.2 (L) 12/04/2020 0253   HGB 9.8 (L) 05/10/2020 1328   HCT 30.9 (L) 12/04/2020 0253   PLT 161 12/04/2020 0253   PLT 179 05/10/2020  1328   MCV 98.4 12/04/2020 0253   MCH 32.5 12/04/2020 0253   MCHC 33.0 12/04/2020 0253   RDW 17.1 (H) 12/04/2020 0253   LYMPHSABS 0.7 11/28/2020 1744   MONOABS 0.4 11/28/2020 1744   EOSABS 0.1 11/28/2020 1744   BASOSABS 0.0 11/28/2020 1744   CMP Latest Ref Rng & Units 12/04/2020 12/03/2020 12/02/2020  Glucose 70 - 99 mg/dL 113(H) 141(H) 127(H)  BUN 6 - 20 mg/dL 47(H) 45(H) 39(H)  Creatinine 0.44 - 1.00 mg/dL 1.93(H) 1.94(H) 2.01(H)  Sodium 135 - 145 mmol/L 138 137 136  Potassium 3.5 - 5.1 mmol/L 3.6 3.7 3.6  Chloride 98 - 111 mmol/L 100 101 99  CO2 22 - 32 mmol/L _1 Calcium 8.9 - 10.3 mg/dL 8.6(L) 8.5(L) 9.0  Total Protein 6.5 - 8.1 g/dL 5.9(L) 5.6(L) 6.0(L)  Total Bilirubin 0.3 - 1.2 mg/dL 0.9 0.8 0.8  Alkaline Phos 38 - 126 U/L 32(L) 31(L) 34(L)  AST 15 - 41 U/L 51(H) 42(H) 29  ALT 0 - 44 U/L 36 26 14     Radiology Studies: No results found.   Scheduled Meds:  arformoterol  15 mcg Nebulization BID   budesonide  0.5 mg Nebulization BID   chlorhexidine gluconate (MEDLINE  KIT)  15 mL Mouth Rinse BID   Chlorhexidine Gluconate Cloth  6 each Topical Daily   dexamethasone  4 mg Per Tube Q8H   escitalopram  5 mg Per Tube Daily   feeding supplement (PROSource TF)  90 mL Per Tube BID   free water  150 mL Per Tube Q4H   furosemide  20 mg Intravenous BID   heparin injection (subcutaneous)  5,000 Units Subcutaneous Q8H   insulin aspart  0-9 Units Subcutaneous Q4H   levETIRAcetam  750 mg Per Tube BID   lidocaine  1 patch Transdermal Q24H   mouth rinse  15 mL Mouth Rinse 10 times per day   midodrine  5 mg Per Tube BID WC   pantoprazole sodium  40 mg Per Tube Daily   scopolamine  1 patch Transdermal Q72H   Continuous Infusions:  sodium chloride     feeding supplement (JEVITY 1.5 CAL/FIBER) 1,000 mL (12/02/20 2108)     LOS: 6 days   Time spent: 25  Nita Sells, MD [facility] To contact the attending provider between 7A-7P or the covering provider during  after hours 7P-7A, please log into the web site [email or web address] and access using universal [place] password for that web site. If you do not have the password, please call the hospital operator.  12/04/2020, 7:40 AM

## 2020-12-05 ENCOUNTER — Inpatient Hospital Stay (HOSPITAL_COMMUNITY): Payer: Medicaid Other

## 2020-12-05 DIAGNOSIS — Z930 Tracheostomy status: Secondary | ICD-10-CM

## 2020-12-05 DIAGNOSIS — C329 Malignant neoplasm of larynx, unspecified: Secondary | ICD-10-CM | POA: Diagnosis not present

## 2020-12-05 DIAGNOSIS — Z931 Gastrostomy status: Secondary | ICD-10-CM

## 2020-12-05 LAB — COMPREHENSIVE METABOLIC PANEL
ALT: 30 U/L (ref 0–44)
AST: 34 U/L (ref 15–41)
Albumin: 2.7 g/dL — ABNORMAL LOW (ref 3.5–5.0)
Alkaline Phosphatase: 30 U/L — ABNORMAL LOW (ref 38–126)
Anion gap: 8 (ref 5–15)
BUN: 50 mg/dL — ABNORMAL HIGH (ref 6–20)
CO2: 30 mmol/L (ref 22–32)
Calcium: 8.5 mg/dL — ABNORMAL LOW (ref 8.9–10.3)
Chloride: 100 mmol/L (ref 98–111)
Creatinine, Ser: 1.92 mg/dL — ABNORMAL HIGH (ref 0.44–1.00)
GFR, Estimated: 29 mL/min — ABNORMAL LOW (ref >60)
Glucose, Bld: 120 mg/dL — ABNORMAL HIGH (ref 70–99)
Potassium: 4 mmol/L (ref 3.5–5.1)
Sodium: 138 mmol/L (ref 135–145)
Total Bilirubin: 0.8 mg/dL (ref 0.3–1.2)
Total Protein: 5.7 g/dL — ABNORMAL LOW (ref 6.5–8.1)

## 2020-12-05 LAB — CBC
HCT: 31 % — ABNORMAL LOW (ref 36.0–46.0)
Hemoglobin: 10.4 g/dL — ABNORMAL LOW (ref 12.0–15.0)
MCH: 33 pg (ref 26.0–34.0)
MCHC: 33.5 g/dL (ref 30.0–36.0)
MCV: 98.4 fL (ref 80.0–100.0)
Platelets: 149 10*3/uL — ABNORMAL LOW (ref 150–400)
RBC: 3.15 MIL/uL — ABNORMAL LOW (ref 3.87–5.11)
RDW: 17.2 % — ABNORMAL HIGH (ref 11.5–15.5)
WBC: 10.8 10*3/uL — ABNORMAL HIGH (ref 4.0–10.5)
nRBC: 0 % (ref 0.0–0.2)

## 2020-12-05 LAB — PROCALCITONIN: Procalcitonin: <0.1 ng/mL

## 2020-12-05 LAB — URINALYSIS, ROUTINE W REFLEX MICROSCOPIC
Bilirubin Urine: NEGATIVE
Glucose, UA: NEGATIVE mg/dL
Hgb urine dipstick: NEGATIVE
Ketones, ur: NEGATIVE mg/dL
Leukocytes,Ua: NEGATIVE
Nitrite: NEGATIVE
Protein, ur: NEGATIVE mg/dL
Specific Gravity, Urine: 1.019 (ref 1.005–1.030)
pH: 5 (ref 5.0–8.0)

## 2020-12-05 LAB — GLUCOSE, CAPILLARY
Glucose-Capillary: 122 mg/dL — ABNORMAL HIGH (ref 70–99)
Glucose-Capillary: 132 mg/dL — ABNORMAL HIGH (ref 70–99)
Glucose-Capillary: 125 mg/dL — ABNORMAL HIGH (ref 70–99)
Glucose-Capillary: 117 mg/dL — ABNORMAL HIGH (ref 70–99)
Glucose-Capillary: 126 mg/dL — ABNORMAL HIGH (ref 70–99)
Glucose-Capillary: 126 mg/dL — ABNORMAL HIGH (ref 70–99)

## 2020-12-05 LAB — LACTIC ACID, PLASMA
Lactic Acid, Venous: 1.4 mmol/L (ref 0.5–1.9)
Lactic Acid, Venous: 1.5 mmol/L (ref 0.5–1.9)

## 2020-12-05 MED ORDER — LORAZEPAM 2 MG/ML IJ SOLN
0.5000 mg | Freq: Once | INTRAMUSCULAR | Status: AC
  Administered 2020-12-05: 0.5 mg via INTRAVENOUS
  Filled 2020-12-05: qty 1

## 2020-12-05 MED ORDER — VANCOMYCIN HCL 1500 MG/300ML IV SOLN: 1500.0000 mg | INTRAVENOUS | Status: DC

## 2020-12-05 MED ORDER — SENNOSIDES-DOCUSATE SODIUM 8.6-50 MG PO TABS
1.0000 | ORAL_TABLET | Freq: Two times a day (BID) | ORAL | Status: DC
  Administered 2020-12-05 – 2020-12-13 (×15): 1
  Filled 2020-12-05 (×15): qty 1

## 2020-12-05 MED ORDER — VANCOMYCIN HCL 2000 MG/400ML IV SOLN
2000.0000 mg | Freq: Once | INTRAVENOUS | Status: AC
  Administered 2020-12-05: 2000 mg via INTRAVENOUS
  Filled 2020-12-05: qty 400

## 2020-12-05 NOTE — Progress Notes (Signed)
Charge nurse notified and MD made aware of yellow MEWS. Pt has history of Low BP. Lasix was not given due to low BP. MD is aware of this. See new Orders.   12/05/20 0742  Vitals  Temp 98 F (36.7 C)  Temp Source Oral  BP (!) 91/51  MAP (mmHg) (!) 63  BP Location Left Arm  BP Method Automatic  Patient Position (if appropriate) Lying  Pulse Rate (!) 47  Pulse Rate Source Monitor  ECG Heart Rate (!) 48  Resp 13  Level of Consciousness  Level of Consciousness Alert  Oxygen Therapy  SpO2 91 %  O2 Device Tracheostomy Collar  O2 Flow Rate (L/min) 8 L/min  FiO2 (%) 35 %  Pulse Oximetry Type Continuous  Pain Assessment  Pain Scale 0-10  Pain Score Asleep

## 2020-12-05 NOTE — Progress Notes (Signed)
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, status post tracheostomy and gastrostomy, s/p XRT and chemo, presented to [facility] ED due to tonic clonic shaking concerning for seizure activity, brain imaging showed enhancing mass surrounding edema, transferred to [place], diagnosed with meningioma by mri , neurosurgery is following for possible resection if aki improves, ENT recommend tertiary care referral if meningioma is addressed Palliative care following for goals of care discussion  Subjective:  She is able to talk briefly with Passy-Muir valve She denies increased airway secretion,  Rn reports no bm for three days, she remained on tube feeds due to poor oral intake Blood pressure low normal , on chronic midodrine, lasix held today due to low normal bp, she does not have edema on lower extremity  She is on trach collar at 8liters  Assessment & Plan:   Active Problems:   Laryngeal cancer (HCC)   Status epilepticus (HCC)   Seizure ([place])    Meningioma/enhancing mass with surrounding edema/seizure -On Decadron, [facility] to reevaluate next week to decide on whether intervention is needed  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 discussion of salvage laryngectomy and bilateral neck dissection, to which she was amenable  Trach aspirate grew MRSA, she has mild leukocytosis, blood pressure borderline, but she does not appear septic  Blood culture sent Check lactic acid, procalcitonin Start vancomycin for now, could be colonization, d/c vanc if procalcitonin ok and blood culture no growth  AKI on CKD 3B,  versus progressive CKD -Creatinine appear peaked at 2.19, creatinine was 1.39 in 06/2020  Urinary retention Foley inserted on 8/3  Chronic hypotension On midodrine at home  History of DVT On Eliquis at home   Nutritional Assessment:  The patient's BMI is: Body mass index is 35.97 kg/m.Marland Kitchen  Seen by dietician.  I agree with the assessment and plan as outlined below:  Nutrition Status: Nutrition Problem: Inadequate oral intake Etiology: dysphagia, decreased appetite Signs/Symptoms: meal completion < 50% Interventions: Tube feeding, Prostat  .     Skin Assessment:  I have examined the patient's skin and I agree with the wound assessment as performed by the wound care RN as outlined below:  Pressure Injury 04/28/20 Throat Mid;Lower Stage 2 -  Partial thickness loss of dermis presenting as a shallow open injury with a red, pink wound bed without slough. under trach flange (Active)  04/28/20 1000  Location: Throat  Location Orientation: Mid;Lower  Staging: Stage 2 -  Partial thickness loss of dermis presenting as a shallow open injury with a red, pink wound bed without slough.  Wound Description (Comments): under trach flange  Present on Admission: No    Unresulted Labs (From admission, onward)     Start     Ordered   12/05/20 0823  Urinalysis, Routine w reflex microscopic  Once,   R        12/05/20 0822   12/05/20 0823  Culture, blood (routine x 2)  BLOOD CULTURE X 2,   R (with TIMED occurrences)      12/05/20 3539  DVT prophylaxis: heparin injection 5,000 Units Start: 11/30/20 1400 Place and maintain sequential compression device Start: 11/29/20 0405 SCDs Start: 11/28/20 2201   Code Status:DNR Family Communication: none at bedside  Disposition:   Status is: Inpatient  Dispo: The patient is from: home              Anticipated d/c is to: TBD              Anticipated d/c date is: needs neurosurgery reeval, then discharge?                 Consultants:  Neurosurgery ENT Oncology Critical care Palliative care IR  Procedures:  PEG placement on 8/2  Antimicrobials:   Anti-infectives (From admission, onward)    Start     Dose/Rate Route Frequency Ordered Stop   12/07/20 1200  vancomycin (VANCOREADY) IVPB 1500 mg/300 mL        1,500 mg 150 mL/hr over 120 Minutes Intravenous Every 48 hours 12/05/20 1001     12/05/20 1200  vancomycin (VANCOREADY) IVPB 2000 mg/400 mL        2,000 mg 200 mL/hr over 120 Minutes Intravenous  Once 12/05/20 1001 12/05/20 1439   11/29/20 1800  azithromycin (ZITHROMAX) 500 mg in sodium chloride 0.9 % 250 mL IVPB  Status:  Discontinued        500 mg 250 mL/hr over 60 Minutes Intravenous Every 24 hours 11/29/20 0401 11/29/20 0905   11/29/20 0800  cefTRIAXone (ROCEPHIN) 1 g in sodium chloride 0.9 % 100 mL IVPB  Status:  Discontinued        1 g 200 mL/hr over 30 Minutes Intravenous Every 24 hours 11/29/20 0401 11/29/20 0905   11/28/20 2100  vancomycin (VANCOREADY) IVPB 1750 mg/350 mL  Status:  Discontinued        1,750 mg 175 mL/hr over 120 Minutes Intravenous  Once 11/28/20 2024 11/28/20 2029   11/28/20 2100  vancomycin (VANCOREADY) IVPB 1750 mg/350 mL  Status:  Discontinued        1,750 mg 175 mL/hr over 120 Minutes Intravenous Every 48 hours 11/28/20 2029 11/29/20 0349   11/28/20 2045  metroNIDAZOLE (FLAGYL) IVPB 500 mg  Status:  Discontinued        500 mg 100 mL/hr over 60 Minutes Intravenous Every 8 hours 11/28/20 2033 11/29/20 0349   11/28/20 2015  levofloxacin (LEVAQUIN) IVPB 750 mg        750 mg 100 mL/hr over 90 Minutes Intravenous  Once 11/28/20 2012 11/28/20 2204           Objective: Vitals:   12/05/20 0500 12/05/20 0742 12/05/20 0809 12/05/20 0819  BP: (!) 91/55 (!) 91/51 (!) 101/54   Pulse: 91 (!) 47 (!) 47 (!) 47  Resp: _0 Temp: 98 F (36.7 C)     TempSrc: Oral     SpO2: 94% 91% 95% 99%  Weight:      Height:        Intake/Output Summary (Last 24  hours) at 12/05/2020 0827 Last data filed at 12/05/2020 0700 Gross per 24 hour  Intake 1830 ml  Output 2225 ml  Net -395 ml   Filed Weights   11/28/20 1558 11/29/20 1530 11/30/20 1200  Weight: 96.2 kg 100.8 kg 101.1 kg    Examination:  General exam: chronically ill appearing, calm, NAD, + tach, + peg Respiratory system:  Respiratory effort normal. Cardiovascular system: S1 & S2 heard, RRR. No pedal edema. Gastrointestinal system: Abdomen is nondistended,  soft and nontender. Normal bowel sounds heard. Central nervous system: Alert and oriented. No focal neurological deficits. Extremities:generalized weakness, no edema Skin: No rashes, lesions or ulcers Psychiatry: Judgement and insight appear normal. Mood & affect appropriate.     Data Reviewed: I have personally reviewed following labs and imaging studies  CBC: Recent Labs  Lab 11/28/20 1744 11/29/20 0331 12/01/20 0454 12/02/20 0311 12/03/20 0349 12/04/20 0253 12/05/20 0337  WBC 4.5   < > 9.2 7.3 7.3 9.3 10.8*  NEUTROABS 3.2  --   --   --   --   --   --   HGB 11.0*   < > 10.2* 10.1* 10.1* 10.2* 10.4*  HCT 33.5*   < > 30.9* 30.0* 30.8* 30.9* 31.0*  MCV 99.4   < > 97.8 97.4 98.7 98.4 98.4  PLT 236   < > 197 201 160 161 149*   < > = values in this interval not displayed.    Basic Metabolic Panel: Recent Labs  Lab 11/28/20 1744 11/29/20 0331 11/29/20 1556 11/30/20 0420 11/30/20 1132 11/30/20 1712 12/01/20 0454 12/02/20 0311 12/03/20 0349 12/04/20 0253 12/05/20 0337  NA 138   < >  --   --    < >  --  138 136 137 138 138  K 3.6   < >  --   --    < >  --  3.3* 3.6 3.7 3.6 4.0  CL 96*  --   --   --    < >  --  98 99 101 100 100  CO2 27  --   --   --    < >  --  _0 GLUCOSE 102*  --   --   --    < >  --  143* 127* 141* 113* 120*  BUN 14  --   --   --    < >  --  33* 39* 45* 47* 50*  CREATININE 2.06*   < >  --   --    < >  --  2.19* 2.01* 1.94* 1.93* 1.92*  CALCIUM 9.5  --   --   --    < >  --  9.2 9.0  8.5* 8.6* 8.5*  MG 1.9  --  1.9 2.1  --  1.9 2.0  --   --   --   --   PHOS  --   --  3.4 4.2  --  3.9 4.5  --   --   --   --    < > = values in this interval not displayed.    GFR: Estimated Creatinine Clearance: 37.4 mL/min (A) (by C-G formula based on SCr of 1.92 mg/dL (H)).  Liver Function Tests: Recent Labs  Lab 11/28/20 1744 12/02/20 0311 12/03/20 0349 12/04/20 0253 12/05/20 0337  AST 36 29 42* 51* 34  ALT _1 36 30  ALKPHOS 44 34* 31* 32* 30*  BILITOT 1.2 0.8 0.8 0.9 0.8  PROT 7.3 6.0* 5.6* 5.9* 5.7*  ALBUMIN 3.7 2.6* 2.6* 2.8* 2.7*    CBG: Recent Labs  Lab 12/04/20 1119 12/04/20 1549 12/04/20 2012 12/04/20 2351 12/05/20 0520  GLUCAP 115* 125* 136* 114* 122*     Recent Results (from the past 240 hour(s))  Resp Panel by RT-PCR (Flu A&B, Covid) Nasopharyngeal Swab     Status: None   Collection Time: 11/28/20  6:37 PM   Specimen: Nasopharyngeal Swab;  Nasopharyngeal(NP) swabs in vial transport medium  Result Value Ref Range Status   SARS Coronavirus 2 by RT PCR NEGATIVE NEGATIVE Final    Comment: (NOTE) SARS-CoV-2 target nucleic acids are NOT DETECTED.  The SARS-CoV-2 RNA is generally detectable in upper respiratory specimens during the acute phase of infection. The lowest concentration of SARS-CoV-2 viral copies this assay can detect is 138 copies/mL. A negative result does not preclude SARS-Cov-2 infection and should not be used as the sole basis for treatment or other patient management decisions. A negative result may occur with  improper specimen collection/handling, submission of specimen other than nasopharyngeal swab, presence of viral mutation(s) within the areas targeted by this assay, and inadequate number of viral copies(<138 copies/mL). A negative result must be combined with clinical observations, patient history, and epidemiological information. The expected result is Negative.  Fact Sheet for Patients:   EntrepreneurPulse.com.au  Fact Sheet for Healthcare Providers:  IncredibleEmployment.be  This test is no t yet approved or cleared by the [place] FDA and  has been authorized for detection and/or diagnosis of SARS-CoV-2 by FDA under an Emergency Use Authorization (EUA). This EUA will remain  in effect (meaning this test can be used) for the duration of the COVID-19 declaration under Section 564(b)(1) of the Act, 21 U.S.C.section 360bbb-3(b)(1), unless the authorization is terminated  or revoked sooner.       Influenza A by PCR NEGATIVE NEGATIVE Final   Influenza B by PCR NEGATIVE NEGATIVE Final    Comment: (NOTE) The Xpert Xpress SARS-CoV-2/FLU/RSV plus assay is intended as an aid in the diagnosis of influenza from Nasopharyngeal swab specimens and should not be used as a sole basis for treatment. Nasal washings and aspirates are unacceptable for Xpert Xpress SARS-CoV-2/FLU/RSV testing.  Fact Sheet for Patients: EntrepreneurPulse.com.au  Fact Sheet for Healthcare Providers: IncredibleEmployment.be  This test is not yet approved or cleared by the [place] FDA and has been authorized for detection and/or diagnosis of SARS-CoV-2 by FDA under an Emergency Use Authorization (EUA). This EUA will remain in effect (meaning this test can be used) for the duration of the COVID-19 declaration under Section 564(b)(1) of the Act, 21 U.S.C. section 360bbb-3(b)(1), unless the authorization is terminated or revoked.  Performed at [facility], [address] [place], [name] [address]   Culture, blood (Routine X 2) w Reflex to ID Panel     Status: None   Collection Time: 11/28/20  8:29 PM   Specimen: BLOOD LEFT HAND  Result Value Ref Range Status   Specimen Description BLOOD LEFT HAND  Final   Special Requests   Final    BOTTLES DRAWN AEROBIC AND ANAEROBIC Blood Culture adequate volume   Culture   Final     NO GROWTH 5 DAYS Performed at [facility], [address] [place], [place] [address]    Report Status 12/03/2020 FINAL  Final  Culture, blood (Routine X 2) w Reflex to ID Panel     Status: None   Collection Time: 11/28/20  8:29 PM   Specimen: BLOOD RIGHT HAND  Result Value Ref Range Status   Specimen Description BLOOD RIGHT HAND  Final   Special Requests   Final    BOTTLES DRAWN AEROBIC AND ANAEROBIC Blood Culture adequate volume   Culture   Final    NO GROWTH 5 DAYS Performed at [facility], [address], [place], [place] [address]    Report Status 12/03/2020 FINAL  Final  MRSA Next Gen by PCR, Nasal  Status: Abnormal   Collection Time: 11/29/20  3:01 AM   Specimen: Nasal Mucosa; Nasal Swab  Result Value Ref Range Status   MRSA by PCR Next Gen DETECTED (A) NOT DETECTED Final    Comment: RESULT CALLED TO, READ BACK BY AND VERIFIED WITH: Hansel Feinstein RN, AT [phone] 11/29/20 D. VANHOOK (NOTE) The GeneXpert MRSA Assay (FDA approved for NASAL specimens only), is one component of a comprehensive MRSA colonization surveillance program. It is not intended to diagnose MRSA infection nor to guide or monitor treatment for MRSA infections. Test performance is not FDA approved in patients less than 39 years old. Performed at [facility], [place] [address], [place], [place] [address]   Culture, Respiratory w Gram Stain     Status: None   Collection Time: 11/29/20  8:17 AM   Specimen: Tracheal Aspirate; Respiratory  Result Value Ref Range Status   Specimen Description TRACHEAL ASPIRATE  Final   Special Requests NONE  Final   Gram Stain   Final    RARE WBC PRESENT,BOTH PMN AND MONONUCLEAR NO ORGANISMS SEEN    Culture   Final    RARE METHICILLIN RESISTANT STAPHYLOCOCCUS AUREUS No Pseudomonas species isolated Performed at [facility], [address] [place], [place] [address]    Report Status 12/01/2020 FINAL  Final   Organism ID, Bacteria METHICILLIN RESISTANT  STAPHYLOCOCCUS AUREUS  Final      Susceptibility   Methicillin resistant staphylococcus aureus - MIC*    CIPROFLOXACIN >=8 RESISTANT Resistant     ERYTHROMYCIN >=8 RESISTANT Resistant     GENTAMICIN <=0.5 SENSITIVE Sensitive     OXACILLIN >=4 RESISTANT Resistant     TETRACYCLINE <=1 SENSITIVE Sensitive     VANCOMYCIN 1 SENSITIVE Sensitive     TRIMETH/SULFA <=10 SENSITIVE Sensitive     CLINDAMYCIN <=0.25 SENSITIVE Sensitive     RIFAMPIN <=0.5 SENSITIVE Sensitive     Inducible Clindamycin NEGATIVE Sensitive     * RARE METHICILLIN RESISTANT STAPHYLOCOCCUS AUREUS         Radiology Studies: No results found.      Scheduled Meds:  arformoterol  15 mcg Nebulization BID   budesonide  0.5 mg Nebulization BID   chlorhexidine gluconate (MEDLINE KIT)  15 mL Mouth Rinse BID   Chlorhexidine Gluconate Cloth  6 each Topical Daily   dexamethasone  4 mg Per Tube Q8H   escitalopram  5 mg Per Tube Daily   feeding supplement (PROSource TF)  90 mL Per Tube BID   free water  150 mL Per Tube Q4H   furosemide  20 mg Intravenous BID   heparin injection (subcutaneous)  5,000 Units Subcutaneous Q8H   insulin aspart  0-9 Units Subcutaneous Q4H   levETIRAcetam  750 mg Per Tube BID   lidocaine  1 patch Transdermal Q24H   mouth rinse  15 mL Mouth Rinse 10 times per day   midodrine  5 mg Per Tube BID WC   pantoprazole sodium  40 mg Per Tube Daily   scopolamine  1 patch Transdermal Q72H   Continuous Infusions:  sodium chloride     feeding supplement (JEVITY 1.5 CAL/FIBER) 1,000 mL (12/02/20 2108)     LOS: 7 days   Time spent: 44mns Greater than 50% of this time was spent in counseling, explanation of diagnosis, planning of further management, and coordination of care.   Voice Recognition /Viviann Sparedictation system was used to create this note, attempts have been made to correct errors. Please contact  the author with questions and/or clarifications.   Florencia Reasons, MD PhD FACP [facility]  Available via Epic secure chat 7am-7pm for nonurgent issues Please page for urgent issues To page the attending provider between 7A-7P or the covering provider during after hours 7P-7A, please log into the web site [email or web address] and access using universal [place] password for that web site. If you do not have the password, please call the hospital operator.    12/05/2020, 8:27 AM

## 2020-12-05 NOTE — Progress Notes (Signed)
Pharmacy Antibiotic Note  Veronica Horton is a 60 y.o. female admitted on 11/28/2020 with seizure activity and subsequently required intubation. Patient also has chronic hypoxic respiratory failure and has a tracheostomy.  Pharmacy has been consulted to dose vancomycin for pneumonia.   Trach aspirate on 8/1 grew MRSA - patient has remained afebrile and WBC relatively stable.  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 Patient's BP has been low so MD will repeat CXR and blood cultures.  Would like to proceed with vancomycin for now and can likely d/c within the next couple of days.   Plan: Vancomycin 2g IV x1 (after Bcx drawn), followed by Vancomycin 1500 mg IV q48 hours (eAUC 518, Scr used 1.92, Vd 0.5) F/u ability to d/c or narrow antibiotics F/u repeat cultures, CXR, lactic acid, and procalcitonin Monitor renal function and adjust vancomycin dose as necessary  Height: 5\' 6"  (167.6 cm) Weight: 101.1 kg (222 lb 14.2 oz) IBW/kg (Calculated) : 59.3  Temp (24hrs), Avg:98.2 F (36.8 C), Min:97.9 F (36.6 C), Max:98.8 F (37.1 C)  Recent Labs  Lab 11/28/20 2029 11/29/20 0422 12/01/20 0454 12/02/20 0311 12/03/20 0349 12/04/20 0253 12/05/20 0337  WBC  --    < > 9.2 7.3 7.3 9.3 10.8*  CREATININE  --    < > 2.19* 2.01* 1.94* 1.93* 1.92*  LATICACIDVEN 1.0  --   --   --   --   --   --    < > = values in this interval not displayed.    Estimated Creatinine Clearance: 37.4 mL/min (A) (by C-G formula based on SCr of 1.92 mg/dL (H)).    Allergies  Allergen Reactions   Codeine Hives and Itching    Reports itching only per RN   Penicillins Hives    Did it involve swelling of the face/tongue/throat, SOB, or low BP? No Did it involve sudden or severe rash/hives, skin peeling, or any reaction on the inside of your mouth or nose? Yes Did you need to seek medical attention at a hospital or doctor's office? Unknown When did it last happen?      Over 10 years If all above answers are  "NO", may proceed with cephalosporin use.     Antimicrobials this admission: Levofloxacin 7/31 >> 7/31 Metronidazole 7/31 >> 7/31 Vancomycin 7/31 >> 7/31; 8/7 >> Ceftriaxone 8/1 >> 8/1  Microbiology results: 8/7 BCx: sent 8/1 TA: MRSA  8/1 MRSA PCR: positive 7/31 Bcx: ng F  Thank you for allowing pharmacy to be a part of this patient's care.  Dimple Nanas, PharmD 12/05/2020 10:04 AM

## 2020-12-06 DIAGNOSIS — C329 Malignant neoplasm of larynx, unspecified: Secondary | ICD-10-CM | POA: Diagnosis not present

## 2020-12-06 LAB — GLUCOSE, CAPILLARY
Glucose-Capillary: 134 mg/dL — ABNORMAL HIGH (ref 70–99)
Glucose-Capillary: 107 mg/dL — ABNORMAL HIGH (ref 70–99)
Glucose-Capillary: 86 mg/dL (ref 70–99)
Glucose-Capillary: 106 mg/dL — ABNORMAL HIGH (ref 70–99)
Glucose-Capillary: 123 mg/dL — ABNORMAL HIGH (ref 70–99)

## 2020-12-06 LAB — PROCALCITONIN: Procalcitonin: <0.1 ng/mL

## 2020-12-06 MED ORDER — LIDOCAINE 5 % EX PTCH
1.0000 | MEDICATED_PATCH | CUTANEOUS | Status: DC
  Administered 2020-12-06 – 2020-12-12 (×7): 1 via TRANSDERMAL
  Filled 2020-12-06 (×7): qty 1

## 2020-12-06 MED ORDER — LIDOCAINE 4 % EX CREA
TOPICAL_CREAM | Freq: Three times a day (TID) | CUTANEOUS | Status: DC | PRN
  Administered 2020-12-06: 1 via TOPICAL
  Filled 2020-12-06: qty 5

## 2020-12-06 MED ORDER — ACETAMINOPHEN 325 MG PO TABS
650.0000 mg | ORAL_TABLET | Freq: Four times a day (QID) | ORAL | Status: DC | PRN
  Administered 2020-12-07: 650 mg via ORAL
  Filled 2020-12-06: qty 2

## 2020-12-06 NOTE — Plan of Care (Signed)
  Problem: Safety: Goal: Non-violent Restraint(s) Outcome: Progressing   Problem: Education: Goal: Knowledge of General Education information will improve Description: Including pain rating scale, medication(s)/side effects and non-pharmacologic comfort measures Outcome: Progressing   Problem: Health Behavior/Discharge Planning: Goal: Ability to manage health-related needs will improve Outcome: Progressing   Problem: Clinical Measurements: Goal: Ability to maintain clinical measurements within normal limits will improve Outcome: Progressing Goal: Will remain free from infection Outcome: Progressing Goal: Diagnostic test results will improve Outcome: Progressing

## 2020-12-06 NOTE — Evaluation (Signed)
Occupational Therapy Evaluation Patient Details Name: Veronica Horton MRN: [id number] DOB: Jan 29, 1961 Today's Date: 12/06/2020    History of Present Illness Nashaly Dorantes is a 60 y.o. 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 LKT:GYBWLSLH right convexity meningioma measuring 3.1 x 3.9 x 2.6 cm  with moderate vasogenic edema within the posterior right hemisphere  and 3 mm of leftward midline shift--currently not a surgical candidate due to kidney function.   Clinical Impression   This 60 yo female admitted with above presents to acute OT with PLOF per her report of being able to ambulate with RW and do most of basic ADLs. Currently pt is Mod A for bed mobility, Mod A stand pivot and setup-Max A for basic ADLs. She will continue to benefit from acute OT with follow up [place] as long as family can provide the able level of A otherwise may need SNF.    Follow Up Recommendations  Home health OT;Supervision/Assistance - 24 hour (if family cannot provide current level of A then SNF is recommended)    Equipment Recommendations  Wheelchair (measurements OT);Wheelchair cushion (measurements OT)           Precautions / Restrictions Precautions Precautions: Fall Precaution Comments: trach, peg, foley Restrictions Weight Bearing Restrictions: No      Mobility Bed Mobility Overal bed mobility: Needs Assistance Bed Mobility: Supine to Sit     Supine to sit: Mod assist;HOB elevated          Transfers Overall transfer level: Needs assistance Equipment used: 1 person hand held assist Transfers: Sit to/from [facility] Sit to Stand: Mod assist Stand pivot transfers: Mod assist            Balance Overall balance assessment: Needs  assistance Sitting-balance support: No upper extremity supported;Feet supported Sitting balance-Leahy Scale: Fair     Standing balance support: Bilateral upper extremity supported Standing balance-Leahy Scale: Poor                             ADL either performed or assessed with clinical judgement   ADL Overall ADL's : Needs assistance/impaired Eating/Feeding: Supervision/ safety;Sitting;Bed level   Grooming: Set up;Sitting;Wash/dry face Grooming Details (indicate cue type and reason): in recliner Upper Body Bathing: Minimal assistance;Sitting Upper Body Bathing Details (indicate cue type and reason): in recliner Lower Body Bathing: Maximal assistance Lower Body Bathing Details (indicate cue type and reason): Mod A sit<>stand from bed Upper Body Dressing : Minimal assistance;Sitting Upper Body Dressing Details (indicate cue type and reason): in recliner Lower Body Dressing: Maximal assistance Lower Body Dressing Details (indicate cue type and reason): Mod A sit<>stand from bed Toilet Transfer: Moderate assistance;Stand-pivot Toilet Transfer Details (indicate cue type and reason): Bil HHA Toileting- Clothing Manipulation and Hygiene: Total assistance Toileting - Clothing Manipulation Details (indicate cue type and reason): Mod A for standing             Vision Patient Visual Report: No change from baseline              Pertinent Vitals/Pain Pain Assessment: Faces Faces Pain Scale: Hurts even more Pain Location: back with getting to EOB and standing Pain Descriptors / Indicators: Aching;Sore Pain Intervention(s): Limited activity within patient's tolerance;Monitored during session;Repositioned  Hand Dominance Right   Extremity/Trunk Assessment Upper Extremity Assessment Upper Extremity Assessment: Generalized weakness           Communication Communication Communication: Tracheostomy   Cognition Arousal/Alertness: Awake/alert Behavior During  Therapy: WFL for tasks assessed/performed Overall Cognitive Status: Within Functional Limits for tasks assessed                                                Home Living Family/patient expects to be discharged to:: Private residence Living Arrangements: Other relatives;Spouse/significant other Available Help at Discharge: Friend(s);Available 24 hours/day;Family Type of Home: Mobile home Home Access: Ramped entrance     Home Layout: One level     Bathroom Shower/Tub: Occupational psychologist: Standard Bathroom Accessibility: Yes   Home Equipment: Environmental consultant - 2 wheels;Cane - single point;Walker - 4 wheels          Prior Functioning/Environment Level of Independence: Needs assistance  Gait / Transfers Assistance Needed: Reports using RW since being home and that she walks wherever she needs to go in the home ADL's / Homemaking Assistance Needed: Has been able to perform ADLs. Reports predominantly wearing night gowns for comfort.            OT Problem List: Decreased strength;Impaired balance (sitting and/or standing);Obesity;Pain      OT Treatment/Interventions: Self-care/ADL training;DME and/or AE instruction;Balance training;Patient/family education    OT Goals(Current goals can be found in the care plan section) Acute Rehab OT Goals Patient Stated Goal: to get out of bed OT Goal Formulation: With patient Time For Goal Achievement: 12/20/20 Potential to Achieve Goals: Good  OT Frequency: Min 2X/week    AM-PAC OT "6 Clicks" Daily Activity     Outcome Measure Help from another person eating meals?: A Little Help from another person taking care of personal grooming?: A Little Help from another person toileting, which includes using toliet, bedpan, or urinal?: A Lot Help from another person bathing (including washing, rinsing, drying)?: A Lot Help from another person to put on and taking off regular upper body clothing?: A Lot Help from  another person to put on and taking off regular lower body clothing?: Total 6 Click Score: 13   End of Session Equipment Utilized During Treatment: Gait belt;Oxygen (trach collar 10 liters FIO2 40 %) Nurse Communication: Mobility status;Need for lift equipment (sara stedy, also written on board and told pateint)  Activity Tolerance: Patient tolerated treatment well Patient left: in chair;with call bell/phone within reach;with chair alarm set  OT Visit Diagnosis: Unsteadiness on feet (R26.81);Other abnormalities of gait and mobility (R26.89);Muscle weakness (generalized) (M62.81);Pain Pain - part of body:  (back)                Time: 8676-7209 OT Time Calculation (min): 43 min Charges:  OT General Charges $OT Visit: 1 Visit OT Evaluation $OT Eval Moderate Complexity: 1 Mod OT Treatments $Self Care/Home Management : 23-37 mins  [place] Circle, OTR/L Acute [employer] Pager [phone] Office [phone]    Almon Register 12/06/2020, 6:18 PM

## 2020-12-06 NOTE — Progress Notes (Addendum)
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, status post tracheostomy and gastrostomy, s/p XRT and chemo, presented to [facility] ED due to tonic clonic shaking concerning for seizure activity, brain imaging showed enhancing mass surrounding edema, transferred to [place], diagnosed of  meningioma by MRI , neurosurgery is following for possible resection if aki improves.ENT is also following.Palliative care  were also following for goals of care discussion  Assessment & Plan:   Active Problems:   Laryngeal cancer ([place])   Status epilepticus (H[facility])   Seizure ([place])   Meningioma/enhancing mass with surrounding edema: Currently on Decadron, Keppra.  Neurosurgery reevaluating this week to decide on intervention if needed.  Tonic-clonic seizures: Most likely secondary to meningioma.  Currently on Keppra.  EEG did not show any specific seizure pathology.  No seizure episodes recently.  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  PEG tube dislodged and was replaced on 8/2.  ENT was following.  ENT  had recommended referral to head/ neck surgery in [facility]  for discussion of salvage laryngectomy, bilateral neck dissection.  But ENT recommends clinical stability and decision regarding surgery for meningioma prior to consideration of salvage laryngeal surgery.  Patient will follow up with Dr. Isaias Cowman as an outpatient  MRSA in tracheal aspirate: Tracheal aspirate showed rare MRSA.  She is not septic.  Procalcitonin negative.  We discontinued antibiotics.  AKI on CKD stage IIIb: Kidney function  improving.  Monitor  Urinary retention: Foley inserted on 8/3.  Chronic hypotension: On midodrine  History of DVT: On Eliquis.  Goals of care: Poor  quality of life, multiple comorbidities, including trach/PEG.  Palliative care has been following for goals of care.  Remains DNR.  Current recommendation is to continue full scope of treatment.  Debility/deconditioning: We requested for PT/OT evaluation.  Patient lives with her family at home  Morbid obesity: BMI 37.2  Nutrition Problem: Inadequate oral intake Etiology: dysphagia, decreased appetite      DVT prophylaxis:Heparin [place] Code Status: DNR Family Communication: None at bedside Status is: Inpatient  Remains inpatient appropriate because:Inpatient level of care appropriate due to severity of illness  Dispo: The patient is from: Home              Anticipated d/c is to: Home              Patient currently is not medically stable to d/c.   Difficult to place patient No      Consultants: PCCM, oncology, ENT, neurosurgery  Procedures: Intubation  Antimicrobials:  Anti-infectives (From admission, onward)    Start     Dose/Rate Route Frequency Ordered Stop   12/07/20 1200  vancomycin (VANCOREADY) IVPB 1500 mg/300 mL        1,500 mg 150 mL/hr over 120 Minutes Intravenous Every 48 hours 12/05/20 1001     12/05/20 1200  vancomycin (VANCOREADY) IVPB 2000 mg/400 mL        2,000 mg 200 mL/hr over 120 Minutes Intravenous  Once 12/05/20 1001 12/05/20 1439   11/29/20 1800  azithromycin (ZITHROMAX) 500 mg in sodium chloride 0.9 % 250 mL IVPB  Status:  Discontinued        500 mg 250 mL/hr over 60 Minutes Intravenous Every 24 hours 11/29/20 0401 11/29/20 0905   11/29/20  0800  cefTRIAXone (ROCEPHIN) 1 g in sodium chloride 0.9 % 100 mL IVPB  Status:  Discontinued        1 g 200 mL/hr over 30 Minutes Intravenous Every 24 hours 11/29/20 0401 11/29/20 0905   11/28/20 2100  vancomycin (VANCOREADY) IVPB 1750 mg/350 mL  Status:  Discontinued        1,750 mg 175 mL/hr over 120 Minutes Intravenous  Once 11/28/20 2024 11/28/20 2029   11/28/20 2100  vancomycin (VANCOREADY) IVPB 1750 mg/350  mL  Status:  Discontinued        1,750 mg 175 mL/hr over 120 Minutes Intravenous Every 48 hours 11/28/20 2029 11/29/20 0349   11/28/20 2045  metroNIDAZOLE (FLAGYL) IVPB 500 mg  Status:  Discontinued        500 mg 100 mL/hr over 60 Minutes Intravenous Every 8 hours 11/28/20 2033 11/29/20 0349   11/28/20 2015  levofloxacin (LEVAQUIN) IVPB 750 mg        750 mg 100 mL/hr over 90 Minutes Intravenous  Once 11/28/20 2012 11/28/20 2204       Subjective:  Patient seen and examined the bedside this afternoon.  Hemodynamically stable during my evaluation.  On 8 L of oxygen per minute.  Denies any worsening shortness of breath or cough.  Complains of some abdominal wall discomfort.  Appears overall comfortable.   Objective: Vitals:   12/06/20 0742 12/06/20 0819 12/06/20 1108 12/06/20 1156  BP: (!) 101/53   (!) 105/50  Pulse: (!) 55 (!) 51 (!) 49 (!) 56  Resp: (!) _0 Temp: 98.1 F (36.7 C)   98.7 F (37.1 C)  TempSrc: Oral   Oral  SpO2: 91% 96% 96% 100%  Weight:      Height:        Intake/Output Summary (Last 24 hours) at 12/06/2020 1227 Last data filed at 12/06/2020 0600 Gross per 24 hour  Intake 1360 ml  Output 1900 ml  Net -540 ml   Filed Weights   11/29/20 1530 11/30/20 1200 12/06/20 0500  Weight: 100.8 kg 101.1 kg 104.6 kg    Examination:  General exam: Overall comfortable, not in distress,obese HEENT: PERRL,trach Respiratory system:  no wheezes or crackles  Cardiovascular system: S1 & S2 heard, RRR.  Gastrointestinal system: Abdomen is distended, soft and nontender.PEG Central nervous system: Alert and oriented Extremities: No edema, no clubbing ,no cyanosis Skin: No rashes, no ulcers,no icterus       Data Reviewed: I have personally reviewed following labs and imaging studies  CBC: Recent Labs  Lab 12/01/20 0454 12/02/20 0311 12/03/20 0349 12/04/20 0253 12/05/20 0337  WBC 9.2 7.3 7.3 9.3 10.8*  HGB 10.2* 10.1* 10.1* 10.2* 10.4*  HCT 30.9* 30.0*  30.8* 30.9* 31.0*  MCV 97.8 97.4 98.7 98.4 98.4  PLT 197 201 160 161 676*   Basic Metabolic Panel: Recent Labs  Lab 11/29/20 1556 11/30/20 0420 11/30/20 1132 11/30/20 1712 12/01/20 0454 12/02/20 0311 12/03/20 0349 12/04/20 0253 12/05/20 0337  NA  --   --    < >  --  138 136 137 138 138  K  --   --    < >  --  3.3* 3.6 3.7 3.6 4.0  CL  --   --    < >  --  98 99 101 100 100  CO2  --   --    < >  --  _1 GLUCOSE  --   --    < >  --  143* 127* 141* 113* 120*  BUN  --   --    < >  --  33* 39* 45* 47* 50*  CREATININE  --   --    < >  --  2.19* 2.01* 1.94* 1.93* 1.92*  CALCIUM  --   --    < >  --  9.2 9.0 8.5* 8.6* 8.5*  MG 1.9 2.1  --  1.9 2.0  --   --   --   --   PHOS 3.4 4.2  --  3.9 4.5  --   --   --   --    < > = values in this interval not displayed.   GFR: Estimated Creatinine Clearance: 38.1 mL/min (A) (by C-G formula based on SCr of 1.92 mg/dL (H)). Liver Function Tests: Recent Labs  Lab 12/02/20 0311 12/03/20 0349 12/04/20 0253 12/05/20 0337  AST 29 42* 51* 34  ALT 14 26 36 30  ALKPHOS 34* 31* 32* 30*  BILITOT 0.8 0.8 0.9 0.8  PROT 6.0* 5.6* 5.9* 5.7*  ALBUMIN 2.6* 2.6* 2.8* 2.7*   No results for input(s): LIPASE, AMYLASE in the last 168 hours. No results for input(s): AMMONIA in the last 168 hours. Coagulation Profile: No results for input(s): INR, PROTIME in the last 168 hours. Cardiac Enzymes: No results for input(s): CKTOTAL, CKMB, CKMBINDEX, TROPONINI in the last 168 hours. BNP (last 3 results) No results for input(s): PROBNP in the last 8760 hours. HbA1C: No results for input(s): HGBA1C in the last 72 hours. CBG: Recent Labs  Lab 12/05/20 2022 12/05/20 2318 12/06/20 0329 12/06/20 0757 12/06/20 1204  GLUCAP 126* 126* 134* 107* 86   Lipid Profile: No results for input(s): CHOL, HDL, LDLCALC, TRIG, CHOLHDL, LDLDIRECT in the last 72 hours. Thyroid Function Tests: No results for input(s): TSH, T4TOTAL, FREET4, T3FREE, THYROIDAB in the  last 72 hours. Anemia Panel: No results for input(s): VITAMINB12, FOLATE, FERRITIN, TIBC, IRON, RETICCTPCT in the last 72 hours. Sepsis Labs: Recent Labs  Lab 11/30/20 0420 12/05/20 0921 12/05/20 1244 12/06/20 0655  PROCALCITON <0.10 <0.10  --  <0.10  LATICACIDVEN  --  1.4 1.5  --     Recent Results (from the past 240 hour(s))  Resp Panel by RT-PCR (Flu A&B, Covid) Nasopharyngeal Swab     Status: None   Collection Time: 11/28/20  6:37 PM   Specimen: Nasopharyngeal Swab; Nasopharyngeal(NP) swabs in vial transport medium  Result Value Ref Range Status   SARS Coronavirus 2 by RT PCR NEGATIVE NEGATIVE Final    Comment: (NOTE) SARS-CoV-2 target nucleic acids are NOT DETECTED.  The SARS-CoV-2 RNA is generally detectable in upper respiratory specimens during the acute phase of infection. The lowest concentration of SARS-CoV-2 viral copies this assay can detect is 138 copies/mL. A negative result does not preclude SARS-Cov-2 infection and should not be used as the sole basis for treatment or other patient management decisions. A negative result may occur with  improper specimen collection/handling, submission of specimen other than nasopharyngeal swab, presence of viral mutation(s) within the areas targeted by this assay, and inadequate number of viral copies(<138 copies/mL). A negative result must be combined with clinical observations, patient history, and epidemiological information. The expected result is Negative.  Fact Sheet for Patients:  EntrepreneurPulse.com.au  Fact Sheet for Healthcare Providers:  IncredibleEmployment.be  This test is no t yet approved or cleared by the [place] FDA and  has been authorized for detection and/or diagnosis of SARS-CoV-2 by FDA under an  Emergency Use Authorization (EUA). This EUA will remain  in effect (meaning this test can be used) for the duration of the COVID-19 declaration under Section  564(b)(1) of the Act, 21 U.S.C.section 360bbb-3(b)(1), unless the authorization is terminated  or revoked sooner.       Influenza A by PCR NEGATIVE NEGATIVE Final   Influenza B by PCR NEGATIVE NEGATIVE Final    Comment: (NOTE) The Xpert Xpress SARS-CoV-2/FLU/RSV plus assay is intended as an aid in the diagnosis of influenza from Nasopharyngeal swab specimens and should not be used as a sole basis for treatment. Nasal washings and aspirates are unacceptable for Xpert Xpress SARS-CoV-2/FLU/RSV testing.  Fact Sheet for Patients: EntrepreneurPulse.com.au  Fact Sheet for Healthcare Providers: IncredibleEmployment.be  This test is not yet approved or cleared by the [place] FDA and has been authorized for detection and/or diagnosis of SARS-CoV-2 by FDA under an Emergency Use Authorization (EUA). This EUA will remain in effect (meaning this test can be used) for the duration of the COVID-19 declaration under Section 564(b)(1) of the Act, 21 U.S.C. section 360bbb-3(b)(1), unless the authorization is terminated or revoked.  Performed at [facility], [address], [place], [place] [address]   Culture, blood (Routine X 2) w Reflex to ID Panel     Status: None   Collection Time: 11/28/20  8:29 PM   Specimen: BLOOD LEFT HAND  Result Value Ref Range Status   Specimen Description BLOOD LEFT HAND  Final   Special Requests   Final    BOTTLES DRAWN AEROBIC AND ANAEROBIC Blood Culture adequate volume   Culture   Final    NO GROWTH 5 DAYS Performed at [facility], [address], [place], [place] [address]    Report Status 12/03/2020 FINAL  Final  Culture, blood (Routine X 2) w Reflex to ID Panel     Status: None   Collection Time: 11/28/20  8:29 PM   Specimen: BLOOD RIGHT HAND  Result Value Ref Range Status   Specimen Description BLOOD RIGHT HAND  Final   Special Requests   Final    BOTTLES DRAWN AEROBIC AND ANAEROBIC Blood Culture adequate  volume   Culture   Final    NO GROWTH 5 DAYS Performed at [facility], [address], [place], [place] [address]    Report Status 12/03/2020 FINAL  Final  MRSA Next Gen by PCR, Nasal     Status: Abnormal   Collection Time: 11/29/20  3:01 AM   Specimen: Nasal Mucosa; Nasal Swab  Result Value Ref Range Status   MRSA by PCR Next Gen DETECTED (A) NOT DETECTED Final    Comment: RESULT CALLED TO, READ BACK BY AND VERIFIED WITH: Hansel Feinstein RN, AT 6945 11/29/20 D. VANHOOK (NOTE) The GeneXpert MRSA Assay (FDA approved for NASAL specimens only), is one component of a comprehensive MRSA colonization surveillance program. It is not intended to diagnose MRSA infection nor to guide or monitor treatment for MRSA infections. Test performance is not FDA approved in patients less than 24 years old. Performed at [facility], [place] [address], [place], [place] [address]   Culture, Respiratory w Gram Stain     Status: None   Collection Time: 11/29/20  8:17 AM   Specimen: Tracheal Aspirate; Respiratory  Result Value Ref Range Status   Specimen Description TRACHEAL ASPIRATE  Final   Special Requests NONE  Final   Gram Stain   Final    RARE WBC PRESENT,BOTH PMN AND MONONUCLEAR NO ORGANISMS  SEEN    Culture   Final    RARE METHICILLIN RESISTANT STAPHYLOCOCCUS AUREUS No Pseudomonas species isolated Performed at [facility] [address], [place], [place] [address]    Report Status 12/01/2020 FINAL  Final   Organism ID, Bacteria METHICILLIN RESISTANT STAPHYLOCOCCUS AUREUS  Final      Susceptibility   Methicillin resistant staphylococcus aureus - MIC*    CIPROFLOXACIN >=8 RESISTANT Resistant     ERYTHROMYCIN >=8 RESISTANT Resistant     GENTAMICIN <=0.5 SENSITIVE Sensitive     OXACILLIN >=4 RESISTANT Resistant     TETRACYCLINE <=1 SENSITIVE Sensitive     VANCOMYCIN 1 SENSITIVE Sensitive     TRIMETH/SULFA <=10 SENSITIVE Sensitive     CLINDAMYCIN <=0.25 SENSITIVE Sensitive     RIFAMPIN  <=0.5 SENSITIVE Sensitive     Inducible Clindamycin NEGATIVE Sensitive     * RARE METHICILLIN RESISTANT STAPHYLOCOCCUS AUREUS         Radiology Studies: DG CHEST PORT 1 VIEW  Result Date: 12/05/2020 CLINICAL DATA:  Shortness of breath. EXAM: PORTABLE CHEST 1 VIEW COMPARISON:  12/01/2020 and prior studies FINDINGS: Tracheostomy tube in bilateral LOWER lung consolidation/atelectasis again noted. Bilateral pleural effusions are again noted. There is no evidence of pneumothorax. Pulmonary vascular congestion is again identified. IMPRESSION: Bilateral LOWER lung consolidation/atelectasis and bilateral pleural effusions again noted. Electronically Signed   By: Margarette Canada M.D.   On: 12/05/2020 12:20        Scheduled Meds:  arformoterol  15 mcg Nebulization BID   budesonide  0.5 mg Nebulization BID   chlorhexidine gluconate (MEDLINE KIT)  15 mL Mouth Rinse BID   Chlorhexidine Gluconate Cloth  6 each Topical Daily   dexamethasone  4 mg Per Tube Q8H   escitalopram  5 mg Per Tube Daily   feeding supplement (PROSource TF)  90 mL Per Tube BID   free water  150 mL Per Tube Q4H   furosemide  20 mg Intravenous BID   heparin injection (subcutaneous)  5,000 Units Subcutaneous Q8H   insulin aspart  0-9 Units Subcutaneous Q4H   levETIRAcetam  750 mg Per Tube BID   lidocaine  1 patch Transdermal Q24H   mouth rinse  15 mL Mouth Rinse 10 times per day   midodrine  5 mg Per Tube BID WC   pantoprazole sodium  40 mg Per Tube Daily   scopolamine  1 patch Transdermal Q72H   senna-docusate  1 tablet Per Tube BID   Continuous Infusions:  sodium chloride     feeding supplement (JEVITY 1.5 CAL/FIBER) 1,000 mL (12/06/20 0032)   [START ON 12/07/2020] vancomycin       LOS: 8 days    Time spent: 35 mins.More than 50% of that time was spent in counseling and/or coordination of care.      Shelly Coss, MD [facility] P8/11/2020, 12:27 PM

## 2020-12-06 NOTE — Progress Notes (Signed)
Palliative Medicine RN Note: Secure chat sent to Dr Tawanna Solo, [facility] attending. The PMT chaplain reports that pt was tearful during their visit, requesting a transfer to [facility] so her oncology team can see/manage her (Drs Chryl Heck and Isidore Moos). Dr Tawanna Solo reports we are still waiting on Neurosurgery recommendations, and, as they don't do NS at [facility], she needs to stay here until that is done.  Marjie Skiff Adraine Biffle, RN, BSN, [facility] Palliative Medicine Team 12/06/2020 3:19 PM Office [phone]

## 2020-12-06 NOTE — Progress Notes (Signed)
RN called RT, stating she was at bedside with pt and pt's trach was dislodged. RN x2 advanced trach in and pt stated she felt SOB, per RN. RT at bedside to assess pt. Pt's trach is in correct position, able to pass suction cath with minimal yellow/tan secretions and SVS. RT did increase pt's ATC to 10L 40%. RT will continue to monitor pt.

## 2020-12-06 NOTE — Progress Notes (Signed)
This chaplain responded to PMT consult for spiritual care.  The chaplain understands the consult is for Pt. presence and companionship.  The Pt. Is using the [place] [facility]ve for communication with the chaplain.  The chaplain introduced herself to the Pt. and sat down beside the Pt.  The chaplain listened reflectively as the Pt emotionally shared, "I want to go back to [facility] now."  The chaplain understands the Pt. has a previous relationship with [facility] and her cancer doctors:  Dr. Isidore Moos and Dr. Chryl Heck.  This chaplain updated the PMT and the OT-Cathy of the conversation with the Pt.  This chaplain is available for F/U spiritual care as needed, [phone]

## 2020-12-07 DIAGNOSIS — C329 Malignant neoplasm of larynx, unspecified: Secondary | ICD-10-CM | POA: Diagnosis not present

## 2020-12-07 LAB — CBC WITH DIFFERENTIAL/PLATELET
Abs Immature Granulocytes: 0.38 10*3/uL — ABNORMAL HIGH (ref 0.00–0.07)
Basophils Absolute: 0.1 10*3/uL (ref 0.0–0.1)
Basophils Relative: 0 %
Eosinophils Absolute: 0 10*3/uL (ref 0.0–0.5)
Eosinophils Relative: 0 %
HCT: 28.9 % — ABNORMAL LOW (ref 36.0–46.0)
Hemoglobin: 9.8 g/dL — ABNORMAL LOW (ref 12.0–15.0)
Immature Granulocytes: 3 %
Lymphocytes Relative: 4 %
Lymphs Abs: 0.5 10*3/uL — ABNORMAL LOW (ref 0.7–4.0)
MCH: 33.9 pg (ref 26.0–34.0)
MCHC: 33.9 g/dL (ref 30.0–36.0)
MCV: 100 fL (ref 80.0–100.0)
Monocytes Absolute: 0.9 10*3/uL (ref 0.1–1.0)
Monocytes Relative: 7 %
Neutro Abs: 10.7 10*3/uL — ABNORMAL HIGH (ref 1.7–7.7)
Neutrophils Relative %: 86 %
Platelets: 122 10*3/uL — ABNORMAL LOW (ref 150–400)
RBC: 2.89 MIL/uL — ABNORMAL LOW (ref 3.87–5.11)
RDW: 16.9 % — ABNORMAL HIGH (ref 11.5–15.5)
WBC: 12.5 10*3/uL — ABNORMAL HIGH (ref 4.0–10.5)
nRBC: 0 % (ref 0.0–0.2)

## 2020-12-07 LAB — GLUCOSE, CAPILLARY
Glucose-Capillary: 107 mg/dL — ABNORMAL HIGH (ref 70–99)
Glucose-Capillary: 108 mg/dL — ABNORMAL HIGH (ref 70–99)
Glucose-Capillary: 114 mg/dL — ABNORMAL HIGH (ref 70–99)
Glucose-Capillary: 115 mg/dL — ABNORMAL HIGH (ref 70–99)
Glucose-Capillary: 118 mg/dL — ABNORMAL HIGH (ref 70–99)
Glucose-Capillary: 123 mg/dL — ABNORMAL HIGH (ref 70–99)
Glucose-Capillary: 99 mg/dL (ref 70–99)

## 2020-12-07 LAB — BASIC METABOLIC PANEL
Anion gap: 11 (ref 5–15)
BUN: 55 mg/dL — ABNORMAL HIGH (ref 6–20)
CO2: 30 mmol/L (ref 22–32)
Calcium: 9 mg/dL (ref 8.9–10.3)
Chloride: 93 mmol/L — ABNORMAL LOW (ref 98–111)
Creatinine, Ser: 1.79 mg/dL — ABNORMAL HIGH (ref 0.44–1.00)
GFR, Estimated: 32 mL/min — ABNORMAL LOW (ref >60)
Glucose, Bld: 107 mg/dL — ABNORMAL HIGH (ref 70–99)
Potassium: 3.9 mmol/L (ref 3.5–5.1)
Sodium: 134 mmol/L — ABNORMAL LOW (ref 135–145)

## 2020-12-07 LAB — PROCALCITONIN: Procalcitonin: <0.1 ng/mL

## 2020-12-07 MED ORDER — TAMSULOSIN HCL 0.4 MG PO CAPS
0.4000 mg | ORAL_CAPSULE | Freq: Every day | ORAL | Status: DC
  Administered 2020-12-07 – 2020-12-13 (×7): 0.4 mg via ORAL
  Filled 2020-12-07 (×7): qty 1

## 2020-12-07 MED ORDER — GUAIFENESIN-DM 100-10 MG/5ML PO SYRP
10.0000 mL | ORAL_SOLUTION | ORAL | Status: DC | PRN
  Administered 2020-12-07: 10 mL via ORAL
  Filled 2020-12-07: qty 10

## 2020-12-07 MED ORDER — DICLOFENAC SODIUM 1 % EX GEL
2.0000 g | Freq: Four times a day (QID) | CUTANEOUS | Status: DC
  Administered 2020-12-07 – 2020-12-13 (×21): 2 g via TOPICAL
  Filled 2020-12-07: qty 100

## 2020-12-07 MED ORDER — FUROSEMIDE 20 MG PO TABS
20.0000 mg | ORAL_TABLET | Freq: Two times a day (BID) | ORAL | Status: DC
  Administered 2020-12-07 – 2020-12-09 (×4): 20 mg via ORAL
  Filled 2020-12-07 (×4): qty 1

## 2020-12-07 NOTE — Progress Notes (Signed)
Has been on the call light several times throughout the night to have TV channel changed, for blankets to be taken off or put back on; for temp in room to be turned up or down; to have passey miur valve removed, that she had just put on herself.  She is been calling out for help about every 10-20 minutes.  She will either hit the call light or just bang on side rail with call button.  When asked her to try to consolidate her calls, as she got very angry with me because she heard me across the hall cleaning someone up, after I had just been in her room for about 20 minutes helping her.  She said I was ignoring her but I reminded her that I was with another patient and had to help her.  She then said that she needs help too and I reminded her to please try to consolidate her calls and needs so I can get all of her things done while I am in the room with her, as she does become very angry easily and gets frustrated as well, if she has to wait for any time period.

## 2020-12-07 NOTE — Evaluation (Signed)
Physical Therapy Evaluation Patient Details Name: GENISIS Horton MRN: [id number] DOB: April 17, 1961 Today's Date: 12/07/2020   History of Present Illness  Veronica Horton is a 60 y.o. 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 UXN:ATFTDDUK right convexity meningioma measuring 3.1 x 3.9 x 2.6 cm  with moderate vasogenic edema within the posterior right hemisphere  and 3 mm of leftward midline shift--currently not a surgical candidate due to kidney function.   Clinical Impression  Pt admitted with above diagnosis. PTA pt lived at home with family, mod I mobility using RW, 3L O2 via trach collar. On eval, pt required min assist bed mobility and mod assist transfers. Vitals stable on 8L 35% FiO2 via trach collar. Pt currently with functional limitations due to the deficits listed below (see PT Problem List). Pt will benefit from skilled PT to increase their independence and safety with mobility to allow discharge to the venue listed below.       Follow Up Recommendations Home health PT;Supervision/Assistance - 24 hour (If family unable to provide current level of assist, SNF is recommended.)    Equipment Recommendations  Wheelchair (measurements PT);Wheelchair cushion (measurements PT)    Recommendations for Other Services       Precautions / Restrictions Precautions Precautions: Fall;Other (comment) Precaution Comments: trach, PEG, foley      Mobility  Bed Mobility Overal bed mobility: Needs Assistance Bed Mobility: Sit to Supine       Sit to supine: Min assist   General bed mobility comments: assist with BLE back to bed, cues for sequencing, increased time    Transfers Overall transfer level: Needs assistance Equipment used: 1 person hand held assist Transfers:  Sit to/from [facility] Sit to Stand: Mod assist Stand pivot transfers: Mod assist       General transfer comment: increased time to power up and pivot. Therapist anterior to pt, providing support at gait belt and pt holding therapist's forearms  Ambulation/Gait             General Gait Details: unable  Stairs            Wheelchair Mobility    Modified Rankin (Stroke Patients Only)       Balance Overall balance assessment: Needs assistance Sitting-balance support: No upper extremity supported;Feet supported Sitting balance-Leahy Scale: Fair     Standing balance support: Bilateral upper extremity supported;During functional activity Standing balance-Leahy Scale: Poor Standing balance comment: reliant on external support                             Pertinent Vitals/Pain Pain Assessment: Faces Pain Score: 0-No pain Faces Pain Scale: Hurts a little bit Pain Location: neck/trach location Pain Descriptors / Indicators: Grimacing;Discomfort Pain Intervention(s): Monitored during session;Repositioned    Home Living Family/patient expects to be discharged to:: Private residence Living Arrangements: Other relatives;Spouse/significant other Available Help at Discharge: Friend(s);Available 24 hours/day;Family Type of Home: Mobile home Home Access: Ramped entrance     Home Layout: One level Home Equipment: [place] - 2 wheels;Cane - single point;Walker - 4 wheels      Prior Function Level of Independence: Needs assistance   Gait / Transfers Assistance Needed: Reports using RW since being home and that she walks wherever  she needs to go in the home  ADL's / Homemaking Assistance Needed: Has been able to perform ADLs. Reports predominantly wearing night gowns for comfort.        Hand Dominance   Dominant Hand: Right    Extremity/Trunk Assessment   Upper Extremity Assessment Upper Extremity Assessment: Generalized weakness     Lower Extremity Assessment Lower Extremity Assessment: Generalized weakness    Cervical / Trunk Assessment Cervical / Trunk Assessment: Kyphotic  Communication   Communication: Tracheostomy  Cognition Arousal/Alertness: Awake/alert Behavior During Therapy: WFL for tasks assessed/performed Overall Cognitive Status: Within Functional Limits for tasks assessed                                        General Comments      Exercises     Assessment/Plan    PT Assessment Patient needs continued PT services  PT Problem List Decreased strength;Decreased mobility;Decreased activity tolerance;Decreased balance;Cardiopulmonary status limiting activity;Obesity       PT Treatment Interventions DME instruction;Therapeutic activities;Gait training;Therapeutic exercise;Patient/family education;Balance training;Functional mobility training;Wheelchair mobility training    PT Goals (Current goals can be found in the Care Plan section)  Acute Rehab PT Goals Patient Stated Goal: home PT Goal Formulation: With patient Time For Goal Achievement: 12/21/20 Potential to Achieve Goals: Fair    Frequency Min 3X/week   Barriers to discharge        Co-evaluation               AM-PAC PT "6 Clicks" Mobility  Outcome Measure Help needed turning from your back to your side while in a flat bed without using bedrails?: A Little Help needed moving from lying on your back to sitting on the side of a flat bed without using bedrails?: A Lot Help needed moving to and from a bed to a chair (including a wheelchair)?: A Lot Help needed standing up from a chair using your arms (e.g., wheelchair or bedside chair)?: A Lot Help needed to walk in hospital room?: Total Help needed climbing 3-5 steps with a railing? : Total 6 Click Score: 11    End of Session Equipment Utilized During Treatment: Gait belt;Oxygen Activity Tolerance: Patient tolerated treatment well Patient left: in  bed;with call bell/phone within reach;with bed alarm set Nurse Communication: Mobility status (+2 pivot transfer vs stedy) PT Visit Diagnosis: Other abnormalities of gait and mobility (R26.89);Muscle weakness (generalized) (M62.81)    Time: 1470-9295 PT Time Calculation (min) (ACUTE ONLY): 30 min   Charges:   PT Evaluation $PT Eval Moderate Complexity: 1 Mod PT Treatments $Therapeutic Activity: 8-22 mins        Lorrin Goodell, PT  Office # [phone] Pager [phone]   Lorriane Shire 12/07/2020, 10:58 AM

## 2020-12-07 NOTE — TOC Initial Note (Signed)
Transition of Care [facility]) - Initial/Assessment Note    Patient Details  Name: Veronica Horton MRN: [id number] Date of Birth: November 28, 1960  Transition of [facility]) CM/SW Contact:    Pollie Friar, RN Phone Number: 12/07/2020, 3:54 PM  Clinical Narrative:                 CM met with the patient about home with [facility] services and DME. She states she has trach supplies at home and PEG supplies except out of TF. She states [employer] has supplies her DME and supplies. CM reached out to [employer] and they dont see trach inner cannulas on her list of supplies for home. CM is following up with this so she will have needed supplies.  CM has asked nutrition to please assess needs for TF at home and make recommendations. Cm will f/u with this and get needed TF ordered. Pt was doing bolus feeds at home. Pt with orders for wheelchair and hospital bed for home. CM spoke with pt's sister, [name] (with pt permission) and she states to hold off on bed for now as they don't have the space for a bed. She does want the bed orders included in the d/c packet for later if needed. Wheelchair ordered through [facility].  HH services arranged through [facility]. Orders and information faxed: [phone]. They will need to get auth prior to starting. CM has updated the pts CM through her insurance and she will expedite the process.  CM has also reached out to [facility] trach team to see if they are able to provide some services in the home. CM awaiting their response.  TOC following.  Expected Discharge Plan: [place] Barriers to Discharge: Continued Medical Work up   Patient Goals and CMS Choice   CMS Medicare.gov Compare Post Acute Care list provided to:: Patient Choice offered to / list presented to : Patient  Expected Discharge Plan and Services Expected Discharge Plan: [place]   Discharge Planning Services: CM Consult Post Acute Care Choice: Home Health, Durable  Medical Equipment Living arrangements for the past 2 months: Single Family Home                 DME Arranged: Youth worker wheelchair with seat cushion DME Agency: [facility] Date DME Agency Contacted: 12/07/20   Representative spoke with at DME Agency: [place] Arranged: RN, PT, OT, Nurse's Aide [place] Agency:  ([facility]) Date HH Agency Contacted: 12/07/20      Prior Living Arrangements/Services Living arrangements for the past 2 months: [place] Lives with:: Relatives, Spouse Patient language and need for interpreter reviewed:: Yes Do you feel safe going back to the place where you live?: Yes      Need for Family Participation in Patient Care: Yes (Comment) Care giver support system in place?: Yes (comment) Current home services: DME (walker/ cane/ suction/ oxygen/ tube feed supplies/ trach supplies) Criminal Activity/Legal Involvement Pertinent to Current Situation/Hospitalization: No - Comment as needed  Activities of Daily Living Home Assistive Devices/Equipment: Vent/Trach supplies, Feeding equipment, Oxygen, Enteral Feeding Supplies ADL Screening (condition at time of admission) Patient's cognitive ability adequate to safely complete daily activities?: No Is the patient deaf or have difficulty hearing?: No Does the patient have difficulty seeing, even when wearing glasses/contacts?: No Does the patient have difficulty concentrating, remembering, or making decisions?: Yes Patient able to express need for assistance with ADLs?: Yes Does the patient have difficulty dressing or bathing?: No Independently  performs ADLs?: No Communication: Needs assistance Is this a change from baseline?: Change from baseline, expected to last <3 days Dressing (OT): Needs assistance Is this a change from baseline?: Pre-admission baseline Grooming: Needs assistance Is this a change from baseline?: Pre-admission baseline Feeding: Needs assistance Is this a change from  baseline?: Pre-admission baseline Bathing: Needs assistance Is this a change from baseline?: Pre-admission baseline Toileting: Needs assistance Is this a change from baseline?: Pre-admission baseline In/Out Bed: Needs assistance Is this a change from baseline?: Pre-admission baseline Walks in Home: Dependent Does the patient have difficulty walking or climbing stairs?: No Weakness of Legs: Both Weakness of Arms/Hands: Both  Permission Sought/Granted                  Emotional Assessment Appearance:: Appears stated age Attitude/Demeanor/Rapport: Engaged Affect (typically observed): Accepting Orientation: : Oriented to Self, Oriented to Place, Oriented to  Time, Oriented to Situation   Psych Involvement: No (comment)  Admission diagnosis:  Status epilepticus ([place]) [G40.901] Seizure ([place]) [R56.9] Patient Active Problem List   Diagnosis Date Noted   Status epilepticus ([place]) 11/28/2020   Seizure ([place]) 11/28/2020   Pressure injury of skin    Diarrhea    Chronic respiratory failure (HCC)    SOB (shortness of breath)    PNA (pneumonia) 04/24/2020   Sepsis ([place]) 04/24/2020   Abnormal findings on diagnostic imaging of abdomen 04/12/2020   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   Tracheostomy dependent ([place])    Head and neck cancer ([place])    Acute on chronic respiratory failure with hypoxia and hypercapnia (HCC) 03/13/2020   Laryngeal mass 03/11/2020   Acute respiratory failure with hypercapnia ([place]) 03/10/2020   Acute exacerbation of chronic obstructive pulmonary disease (COPD) ([place]) 01/24/2020   Acute respiratory failure with hypoxia and hypercapnia ([place]) 01/23/2020   Class 3 obesity 01/23/2020   Tobacco abuse 01/23/2020   Pneumonia    Aspiration pneumonitis ([place]) 01/17/2019   Incisional hernia, without obstruction or gangrene    COPD with acute exacerbation ([place]) 01/14/2019   Small bowel obstruction ([place]) 01/13/2019   COPD (chronic obstructive pulmonary disease) ([place])     Hypertension    Acute respiratory failure with hypoxia ([place])    Polycythemia    PCP:  [facility] [place]:   [place], [place] - [address] [place] [address] Phone: [phone] Fax: [phone]     Social Determinants of Health (SDOH) Interventions    Readmission Risk Interventions Readmission Risk Prevention Plan 04/26/2020 01/20/2019  Medication Screening - Complete  Transportation Screening Complete Complete  Medication Review ([place]) Complete -  PCP or Specialist appointment within 3-5 days of discharge Complete -  [place] or Home Care Consult Complete -  SW Recovery Care/Counseling Consult Complete -  Palliative Care Screening Not Applicable -  [place] Patient Refused -  Some recent data might be hidden

## 2020-12-07 NOTE — Progress Notes (Signed)
Nutrition Follow-up  DOCUMENTATION CODES:  Obesity unspecified  INTERVENTION:  Transition to bolus TF via PEG: -228ml Osmolite 1.5 TID -Flush with 36ml free water before and after each TF bolus  TF regimen provides 1065 kcals, 44 grams protein, and 580ml free water per day (923ml total free water with flushes). This meets 66.5% and 40% of pt's minimum estimated calorie and protein needs, respectively.  -Magic cup TID with meals, each supplement provides 290 kcal and 9 grams of protein -MVI with minerals daily  NUTRITION DIAGNOSIS:  Inadequate oral intake related to dysphagia, decreased appetite as evidenced by meal completion < 50%. 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 Gastrostomy 03/2020. Admitted for seizures.  8/02 weaned to trach collar; PEG replaced 8/03 diet advanced t dysphagia 1 with honey thick liquids 8/09 diet advanced to dysphagia 3 with honey thick consistency  Per MD, pt is hemodynamically stable, on 8L O2/min with some secretions from trach. Pt otherwise appears comfortable and is eager to go home. Discussed pt with Case Manager. Pt is supposed to discharge home this week and needs to be transitioned back to her home TF regimen of 250ml Osmolite 1.5 TID per CM. This provides 1065 kcals, 44 grams protein, and 56ml free water per day -- meets 66.5% and 40% of pt's minimum estimated calorie and protein needs, respectively. No PO intake documented. Discussed pt with RN and NT but neither could provide insight towards pt's po intake. Pt reports intake has been good and that she eats >50% of her meals. At home, she states that she typically has 2 balanced meals  and drinks 2 Ensures each day in addition to the home TF regimen as described above. Offered to provide pt with Ensure PO while admitted but her flavor preference isn't available on hospital formulary. Pt denies changes to weight with po intake/TF regimen. Recommend pt continue to follow plan she was following at home.   Current TF via PEG:  -Jevity 1.5 to 45 ml/hr (1080 ml/day) -90 ml ProSource TF BID -146ml free water Q4H Provides: 1780 kcal, 112 grams protein, and 820 ml free water (1754ml total free water with flushes)   Medications: decadron, lasix, SSI, heparin, protonix, senokot-s Labs: Na 134 (L), Cr 1.79 (H) CBGs [phone]  UOP: 2.4L x24 hours I/O: -1.4L since admit  Admission weight: 100.8 kg Current weight: 105.2 kg Non-pitting edema noted to all extremities per RN assessment  Diet Order:   Diet Order             DIET DYS 3 Room service appropriate? Yes; Fluid consistency: Honey Thick  Diet effective now                  EDUCATION NEEDS:  Not appropriate for education at this time  Skin:  Skin Assessment: Reviewed RN Assessment Last BM:  8/10 type 6 Height:  Ht Readings from Last 1 Encounters:  11/28/20 5\' 6"  (1.676 m)  Weight:  Wt Readings from Last 1 Encounters:  12/08/20 105.2 kg  Ideal Body Weight:  59.1 kg BMI:  Body mass index is 37.43 kg/m.  Estimated Nutritional Needs:  Kcal:  1600-1800 Protein:  110-130 grams  Fluid:  >1.6 L/day    Larkin Ina, MS, RD, LDN (she/her/hers) RD pager number and weekend/on-call pager number located in [place].

## 2020-12-07 NOTE — Progress Notes (Signed)
   Providing Compassionate, Quality Care - Together  NEUROSURGERY PROGRESS NOTE   S: No complaints at this time, no sz activity  O: EXAM:  BP 114/62 (BP Location: Left Wrist)   Pulse (!) 52   Temp 98.6 F (37 C) (Oral)   Resp 17   Ht 5\' 6"  (1.676 m)   Wt 104.4 kg   SpO2 93%   BMI 37.15 kg/m   Awake, alert, oriented  Mouthing words appropriately Trach collar PERRLA Face symmetric Moving all extremities equally Sensory intact light touch  ASSESSMENT:  60 y.o. female with   Right parietal extra-axial mass, with local mass-effect and vasogenic edema Seizures  PLAN: -An extensive discussion with the patient.  At this time I recommend treatment for her laryngeal cancer.  Her seizures have been controlled on 1 antiepileptic therefore she should continue this and low-dose steroids for the vasogenic edema.  Her extra-axial mass is likely benign and slow-growing based off previous imaging.  Therefore this should be treated secondarily to her active laryngeal cancer -I recommend she follow-up as an outpatient for surgical evaluation once her laryngeal cancer is treated surgically or stable from a medical standpoint. -I will have her follow-up as an outpatient in a few months    Thank you for allowing me to participate in this patient's care.  Please do not hesitate to call with questions or concerns.   Elwin Sleight, [facility] Cell: [phone]

## 2020-12-07 NOTE — Progress Notes (Cosign Needed)
Hospital bed:  Length of Need Lifetime  The above medical condition requires: Patient requires the ability to reposition frequently  Head must be elevated greater than: 30 degrees  Bed type Semi-electric  Support Surface: Gel Overlay   Pt receives tube feedings and is at risk for aspiration without the ability to raise the head of the bed. She also has a trach and needs head elevated for proper clearance of secretions.   Wheelchair:   Patient suffers from weakness which impairs their ability to perform daily activities like bathing and toileting in the home.  A walker will not resolve  issue with performing activities of daily living. A wheelchair will allow patient to safely perform daily activities. Patient is not able to propel themselves in the home using a standard weight wheelchair due to general weakness. Patient can self propel in the lightweight wheelchair. Length of need Lifetime.  Accessories: elevating leg rests (ELRs), wheel locks, extensions and anti-tip

## 2020-12-07 NOTE — Progress Notes (Signed)
  Speech Language Pathology Treatment: Dysphagia  Patient Details Name: Veronica Horton MRN: [id number] DOB: November 05, 1960 Today's Date: 12/07/2020 Time: 7322-0254 SLP Time Calculation (min) (ACUTE ONLY): 12 min  Assessment / Plan / Recommendation Clinical Impression  Pt seen for follow up *prior session was abbreviated.  SLP posted swallow precaution signs re: new diet recommendations, [employer] protocol, and precautions.  Reviewed using teach back educated her to importance of precautions -  SLP provided pt with thin water after she brushed her gums/tongue.  NO Indication of aspiration with water intake and pt reports significant satisfaction. Using teach back and pt mentioning "I can get pneumonia" re: risk of aspiration, etc.  Pt's intake is very poor and hopeful intake will improve with diet advancement with strict precautions.  SLP will follow up for dysphagia management.    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 Of note, she was admitted with aspiration PNA 03/2020 and CT Head at that time showed extensive vasogenic edema R parietal/occipital region with concern for meningioma.  Patient refused an MRI at that time and was to follow with neurosurgery outpatient; she states that she did not follow up for insurance reasons.  CT head shows redemonstrated mass in the R parietal lobe with extensive vasogenic edema throughout the R parietal lobe. 8/1 vent via chronic trach; 8/2 transitioned to ATC.  Agitated and self-removed PEG 8/2. Completed radiation and chemo 05/31/20. Last PET 6/22 with decreased size of supraglottic mass.  Pt has been followed by SLP during prior admissions, undergoing MBS and PMV evaluations.   Last MBS was11/27/21-  dys3/thin liquids was recommended. Recent MBS 8/3 with slightly deteriorated swallow and trace, silent aspiration of thin and nectar thick liquids.  Dys 1/honey thick recommended pending [place] conversations.      SLP Plan  Continue with current plan of care       Recommendations  Diet recommendations: Thin liquid (water) Liquids provided via: Cup Medication Administration: Via alternative means Supervision: Patient able to self feed Compensations: Small sips/bites Postural Changes and/or Swallow Maneuvers: Seated upright 90 degrees      Patient may use Passy-Muir Speech Valve: Intermittently with supervision PMSV Supervision: Intermittent MD: Please consider changing trach tube to : Cuffless         Oral Care Recommendations: Oral care BID Follow up Recommendations: Other (comment) SLP Visit Diagnosis: Dysphagia, pharyngeal phase (R13.13) Plan: Continue with current plan of care       GO               [name], MS [facility] SLP [place] Office [phone] Pager [phone]  [name] 12/07/2020, 10:58 AM

## 2020-12-07 NOTE — Plan of Care (Signed)

## 2020-12-07 NOTE — Progress Notes (Signed)
  Speech Language Pathology Treatment: Dysphagia  Patient Details Name: Veronica Horton MRN: [id number] DOB: [birth year] Today's Date: 12/07/2020 Time: [id number] SLP Time Calculation (min) (ACUTE ONLY): 12 min  Assessment / Plan / Recommendation Clinical Impression  Pt seen sitting upright in chair with congested cough. SLP set up separate suction for her use for oral clearance.  Pt informed of importance of oral care for hygiene and had her use oral suction set with toothbrush.  Pt wanted mirror ot which SLP showed her on her table - she uses this for don/doffing PMSV.   Pt reports dissatisfaction with her diet stating "I can't eat that thick stuff".  Inquired her diet prior to admit, to which she states she consumed cream of potato soups, etc. Reviewed MBS flouro loops with her showing minimal aspiration of thin and nectar.  Observed pt with consumption of solids - soft - not particulates - with PMSV in place. No indication of aspiration present.  In discussion with pt, reviewed potential for [employer] Protocol that would allow her thin water between meals after oral care for hydration and diet advancement to dys3- NO MEATS - h/o choking on chicken at [facility].   She advised she prefers advancement in diet if MD will allow.  As pt is medically improving, recommend consider advancements.    SLP will follow up with pt and MD re: recommendations.      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 Of note, she was admitted with aspiration PNA 03/2020 and CT Head at that time showed extensive vasogenic edema R parietal/occipital region with concern for meningioma.  Patient refused an MRI at that time and was to follow with neurosurgery outpatient; she  states that she did not follow up for insurance reasons.  CT head shows redemonstrated mass in the R parietal lobe with extensive vasogenic edema throughout the R parietal lobe. 8/1 vent via chronic trach; 8/2 transitioned to ATC.  Agitated and self-removed PEG 8/2. Completed radiation and chemo 05/31/20. Last PET 6/22 with decreased size of supraglottic mass.  Pt has been followed by SLP during prior admissions, undergoing MBS and PMV evaluations.   Last MBS was11/27/21- dys3/thin liquids was recommended. Recent MBS 8/3 with slightly deteriorated swallow and trace, silent aspiration of thin and nectar thick liquids.  Dys 1/honey thick recommended pending [place] conversations.      SLP Plan  Continue with current plan of care       Recommendations  Diet recommendations: Dysphagia 1 (puree);Dysphagia 2 (fine chop);Honey-thick liquid Liquids provided via: Cup Medication Administration: Via alternative means Supervision: Patient able to self feed Compensations: Small sips/bites Postural Changes and/or Swallow Maneuvers: Seated upright 90 degrees      Patient may use Passy-Muir Speech Valve: Intermittently with supervision PMSV Supervision: Intermittent MD: Please consider changing trach tube to : Cuffless         Oral Care Recommendations: Oral care BID Follow up Recommendations: Other (comment) (TBA) SLP Visit Diagnosis: Dysphagia, pharyngeal phase (R13.13) Plan: Continue with current plan of care       GO              [name], MS [facility] SLP [place] Office [phone] Pager [phone]   [name] 12/07/2020, 8:51 AM

## 2020-12-07 NOTE — Progress Notes (Signed)
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 Neurosurgery was following for possible resection but has recommended outpatient follow up.ENT was also following,now recommending outpatient follow up at [facility].Palliative care  were also following for goals of care discussion.  PT/OT consulted.  Discharge planning.  Assessment & Plan:   Active Problems:   Laryngeal cancer ([place])   Status epilepticus (HCC)   Seizure ([place])   Meningioma/enhancing mass with surrounding edema: Currently on Decadron, Keppra.  Neurosurgery reevaluated her on 12/07/2020 and recommended outpatient follow-up.  As per them, no need for intervention emergently.  Tonic-clonic seizures: Most likely secondary to meningioma.  Currently on Keppra.  EEG did not show any specific seizure pathology.  No seizure episodes recently.  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  PEG tube dislodged and was replaced on 8/2.  ENT was following.  ENT  had recommended referral to head/ neck surgery in [place]  for discussion of salvage laryngectomy, bilateral neck dissection.   Patient will also follow up with ENT,Dr. Isaias Cowman as an outpatient. Her oncologist Dr. Chryl Heck mentioned that she is not a candidate for aggressive treatment, there is no plan for initiating chemotherapy for now.  I also discussed with Dr. Isidore Moos, radiation oncology, recommend outpatient follow-up saying there is no plan for emergent radiation  treatment On discharge, patient should be provided with adequate suction instrument, adequate tracheostomy tubes.  MRSA in tracheal aspirate: Tracheal aspirate showed rare MRSA.  She is not septic.  Procalcitonin negative.  We discontinued antibiotics.  AKI on CKD stage IIIb: Kidney function  improved,now at baseline  Urinary retention: Foley inserted on 7/3.  We will give her a voiding trial, started on Flomax  Chronic hypotension: On midodrine  History of DVT: On Eliquis.  Goals of care: Poor quality of life, multiple comorbidities, including trach/PEG.  Palliative care has been following for goals of care.  Remains DNR.  Current recommendation is to continue full scope of treatment.  Debility/deconditioning: We requested for PT/OT evaluation.  Patient lives with her family at home  Morbid obesity: BMI 37.2  Nutrition Problem: Inadequate oral intake Etiology: dysphagia, decreased appetite      DVT prophylaxis:Heparin [place] Code Status: DNR Family Communication: Called and discussed with sister on phone on 12/07/20 Status is: Inpatient  Remains inpatient appropriate because:Inpatient level of care appropriate due to severity of illness  Dispo: The patient is from: Home              Anticipated d/c is to: Home              Patient currently is not medically stable to d/c.   Difficult to place patient No      Consultants: PCCM, oncology, ENT, neurosurgery  Procedures: Intubation  Antimicrobials:  Anti-infectives (From admission, onward)    Start     Dose/Rate Route Frequency Ordered Stop   12/07/20 1200  vancomycin (VANCOREADY) IVPB 1500 mg/300 mL  Status:  Discontinued        1,500 mg 150 mL/hr over 120 Minutes Intravenous  Every 48 hours 12/05/20 1001 12/06/20 1241   12/05/20 1200  vancomycin (VANCOREADY) IVPB 2000 mg/400 mL        2,000 mg 200 mL/hr over 120 Minutes Intravenous  Once 12/05/20 1001 12/05/20 1439   11/29/20 1800  azithromycin (ZITHROMAX) 500 mg in  sodium chloride 0.9 % 250 mL IVPB  Status:  Discontinued        500 mg 250 mL/hr over 60 Minutes Intravenous Every 24 hours 11/29/20 0401 11/29/20 0905   11/29/20 0800  cefTRIAXone (ROCEPHIN) 1 g in sodium chloride 0.9 % 100 mL IVPB  Status:  Discontinued        1 g 200 mL/hr over 30 Minutes Intravenous Every 24 hours 11/29/20 0401 11/29/20 0905   11/28/20 2100  vancomycin (VANCOREADY) IVPB 1750 mg/350 mL  Status:  Discontinued        1,750 mg 175 mL/hr over 120 Minutes Intravenous  Once 11/28/20 2024 11/28/20 2029   11/28/20 2100  vancomycin (VANCOREADY) IVPB 1750 mg/350 mL  Status:  Discontinued        1,750 mg 175 mL/hr over 120 Minutes Intravenous Every 48 hours 11/28/20 2029 11/29/20 0349   11/28/20 2045  metroNIDAZOLE (FLAGYL) IVPB 500 mg  Status:  Discontinued        500 mg 100 mL/hr over 60 Minutes Intravenous Every 8 hours 11/28/20 2033 11/29/20 0349   11/28/20 2015  levofloxacin (LEVAQUIN) IVPB 750 mg        750 mg 100 mL/hr over 90 Minutes Intravenous  Once 11/28/20 2012 11/28/20 2204       Subjective:  Patient seen and examined at the bedside this morning.  Hemodynamically stable.  Looks comfortable during my evaluation.  Respiratory status is stable.  On 8 L of oxygen.  Complains of some back and abdominal wall discomfort.   Objective: Vitals:   12/07/20 0000 12/07/20 0300 12/07/20 0425 12/07/20 0500  BP: (!) 103/55 103/70    Pulse: 60 62    Resp: _0 Temp: 98.2 F (36.8 C) 98.5 F (36.9 C)    TempSrc: Oral Oral    SpO2: 97% 97%    Weight:    104.4 kg  Height:        Intake/Output Summary (Last 24 hours) at 12/07/2020 0759 Last data filed at 12/07/2020 0403 Gross per 24 hour  Intake --  Output 3300 ml  Net -3300 ml   Filed Weights   11/30/20 1200 12/06/20 0500 12/07/20 0500  Weight: 101.1 kg 104.6 kg 104.4 kg    Examination:  General exam: Overall comfortable, not in distress,obese,chronically ill looking HEENT: PERRL,trach Respiratory system:   no wheezes or crackles  Cardiovascular system: S1 & S2 heard, RRR.  Gastrointestinal system: Abdomen is nondistended, soft and nontender.PEG Central nervous system: Alert and oriented Extremities: No edema, no clubbing ,no cyanosis Skin: No rashes, no ulcers,no icterus    Data Reviewed: I have personally reviewed following labs and imaging studies  CBC: Recent Labs  Lab 12/02/20 0311 12/03/20 0349 12/04/20 0253 12/05/20 0337 12/07/20 0436  WBC 7.3 7.3 9.3 10.8* 12.5*  NEUTROABS  --   --   --   --  10.7*  HGB 10.1* 10.1* 10.2* 10.4* 9.8*  HCT 30.0* 30.8* 30.9* 31.0* 28.9*  MCV 97.4 98.7 98.4 98.4 100.0  PLT 201 160 161 149* 998*   Basic Metabolic Panel: Recent Labs  Lab 11/30/20 1712 12/01/20 0454 12/02/20 0311 12/03/20 0349 12/04/20 0253 12/05/20 0337 12/07/20 0436  NA  --  138 136 137 138 138 134*  K  --  3.3* 3.6 3.7 3.6 4.0 3.9  CL  --  98 99 101 100 100 93*  CO2  --  _0 GLUCOSE  --  143* 127* 141* 113* 120* 107*  BUN  --  33* 39* 45* 47* 50* 55*  CREATININE  --  2.19* 2.01* 1.94* 1.93* 1.92* 1.79*  CALCIUM  --  9.2 9.0 8.5* 8.6* 8.5* 9.0  MG 1.9 2.0  --   --   --   --   --   PHOS 3.9 4.5  --   --   --   --   --    GFR: Estimated Creatinine Clearance: 40.8 mL/min (A) (by C-G formula based on SCr of 1.79 mg/dL (H)). Liver Function Tests: Recent Labs  Lab 12/02/20 0311 12/03/20 0349 12/04/20 0253 12/05/20 0337  AST 29 42* 51* 34  ALT 14 26 36 30  ALKPHOS 34* 31* 32* 30*  BILITOT 0.8 0.8 0.9 0.8  PROT 6.0* 5.6* 5.9* 5.7*  ALBUMIN 2.6* 2.6* 2.8* 2.7*   No results for input(s): LIPASE, AMYLASE in the last 168 hours. No results for input(s): AMMONIA in the last 168 hours. Coagulation Profile: No results for input(s): INR, PROTIME in the last 168 hours. Cardiac Enzymes: No results for input(s): CKTOTAL, CKMB, CKMBINDEX, TROPONINI in the last 168 hours. BNP (last 3 results) No results for input(s): PROBNP in the last 8760  hours. HbA1C: No results for input(s): HGBA1C in the last 72 hours. CBG: Recent Labs  Lab 12/06/20 1204 12/06/20 1620 12/06/20 2101 12/07/20 0034 12/07/20 0411  GLUCAP 86 106* 123* 99 118*   Lipid Profile: No results for input(s): CHOL, HDL, LDLCALC, TRIG, CHOLHDL, LDLDIRECT in the last 72 hours. Thyroid Function Tests: No results for input(s): TSH, T4TOTAL, FREET4, T3FREE, THYROIDAB in the last 72 hours. Anemia Panel: No results for input(s): VITAMINB12, FOLATE, FERRITIN, TIBC, IRON, RETICCTPCT in the last 72 hours. Sepsis Labs: Recent Labs  Lab 12/05/20 0921 12/05/20 1244 12/06/20 0655 12/07/20 0436  PROCALCITON <0.10  --  <0.10 <0.10  LATICACIDVEN 1.4 1.5  --   --     Recent Results (from the past 240 hour(s))  Resp Panel by RT-PCR (Flu A&B, Covid) Nasopharyngeal Swab     Status: None   Collection Time: 11/28/20  6:37 PM   Specimen: Nasopharyngeal Swab; Nasopharyngeal(NP) swabs in vial transport medium  Result Value Ref Range Status   SARS Coronavirus 2 by RT PCR NEGATIVE NEGATIVE Final    Comment: (NOTE) SARS-CoV-2 target nucleic acids are NOT DETECTED.  The SARS-CoV-2 RNA is generally detectable in upper respiratory specimens during the acute phase of infection. The lowest concentration of SARS-CoV-2 viral copies this assay can detect is 138 copies/mL. A negative result does not preclude SARS-Cov-2 infection and should not be used as the sole basis for treatment or other patient management decisions. A negative result may occur with  improper specimen collection/handling, submission of specimen other than nasopharyngeal swab, presence of viral mutation(s) within the areas targeted by this assay, and inadequate number of viral copies(<138 copies/mL). A negative result must be combined with clinical observations, patient history, and epidemiological information. The expected result is Negative.  Fact Sheet for Patients:   EntrepreneurPulse.com.au  Fact Sheet for Healthcare Providers:  IncredibleEmployment.be  This test is no t yet approved or cleared by the [place] FDA and  has been authorized for detection and/or diagnosis of SARS-CoV-2  by FDA under an Emergency Use Authorization (EUA). This EUA will remain  in effect (meaning this test can be used) for the duration of the COVID-19 declaration under Section 564(b)(1) of the Act, 21 U.S.C.section 360bbb-3(b)(1), unless the authorization is terminated  or revoked sooner.       Influenza A by PCR NEGATIVE NEGATIVE Final   Influenza B by PCR NEGATIVE NEGATIVE Final    Comment: (NOTE) The Xpert Xpress SARS-CoV-2/FLU/RSV plus assay is intended as an aid in the diagnosis of influenza from Nasopharyngeal swab specimens and should not be used as a sole basis for treatment. Nasal washings and aspirates are unacceptable for Xpert Xpress SARS-CoV-2/FLU/RSV testing.  Fact Sheet for Patients: EntrepreneurPulse.com.au  Fact Sheet for Healthcare Providers: IncredibleEmployment.be  This test is not yet approved or cleared by the [place] FDA and has been authorized for detection and/or diagnosis of SARS-CoV-2 by FDA under an Emergency Use Authorization (EUA). This EUA will remain in effect (meaning this test can be used) for the duration of the COVID-19 declaration under Section 564(b)(1) of the Act, 21 U.S.C. section 360bbb-3(b)(1), unless the authorization is terminated or revoked.  Performed at [facility], [address] [place], [place] [address]   Culture, blood (Routine X 2) w Reflex to ID Panel     Status: None   Collection Time: 11/28/20  8:29 PM   Specimen: BLOOD LEFT HAND  Result Value Ref Range Status   Specimen Description BLOOD LEFT HAND  Final   Special Requests   Final    BOTTLES DRAWN AEROBIC AND ANAEROBIC Blood Culture adequate volume   Culture   Final     NO GROWTH 5 DAYS Performed at [facility], [address] [place], [place] [address]    Report Status 12/03/2020 FINAL  Final  Culture, blood (Routine X 2) w Reflex to ID Panel     Status: None   Collection Time: 11/28/20  8:29 PM   Specimen: BLOOD RIGHT HAND  Result Value Ref Range Status   Specimen Description BLOOD RIGHT HAND  Final   Special Requests   Final    BOTTLES DRAWN AEROBIC AND ANAEROBIC Blood Culture adequate volume   Culture   Final    NO GROWTH 5 DAYS Performed at [facility], [address] [place], [place] [address]    Report Status 12/03/2020 FINAL  Final  MRSA Next Gen by PCR, Nasal     Status: Abnormal   Collection Time: 11/29/20  3:01 AM   Specimen: Nasal Mucosa; Nasal Swab  Result Value Ref Range Status   MRSA by PCR Next Gen DETECTED (A) NOT DETECTED Final    Comment: RESULT CALLED TO, READ BACK BY AND VERIFIED WITH: Hansel Feinstein RN, AT 1017 11/29/20 D. VANHOOK (NOTE) The GeneXpert MRSA Assay (FDA approved for NASAL specimens only), is one component of a comprehensive MRSA colonization surveillance program. It is not intended to diagnose MRSA infection nor to guide or monitor treatment for MRSA infections. Test performance is not FDA approved in patients less than 48 years old. Performed at [facility], [place] [address] [place], [place] [address]   Culture, Respiratory w Gram Stain     Status: None   Collection Time: 11/29/20  8:17 AM   Specimen: Tracheal Aspirate; Respiratory  Result Value Ref Range Status   Specimen Description TRACHEAL ASPIRATE  Final   Special Requests NONE  Final   Gram Stain   Final    RARE WBC PRESENT,BOTH PMN AND  MONONUCLEAR NO ORGANISMS SEEN    Culture   Final    RARE METHICILLIN RESISTANT STAPHYLOCOCCUS AUREUS No Pseudomonas species isolated Performed at [facility], [address] [place], [place] [address]    Report Status 12/01/2020 FINAL  Final   Organism ID, Bacteria METHICILLIN RESISTANT  STAPHYLOCOCCUS AUREUS  Final      Susceptibility   Methicillin resistant staphylococcus aureus - MIC*    CIPROFLOXACIN >=8 RESISTANT Resistant     ERYTHROMYCIN >=8 RESISTANT Resistant     GENTAMICIN <=0.5 SENSITIVE Sensitive     OXACILLIN >=4 RESISTANT Resistant     TETRACYCLINE <=1 SENSITIVE Sensitive     VANCOMYCIN 1 SENSITIVE Sensitive     TRIMETH/SULFA <=10 SENSITIVE Sensitive     CLINDAMYCIN <=0.25 SENSITIVE Sensitive     RIFAMPIN <=0.5 SENSITIVE Sensitive     Inducible Clindamycin NEGATIVE Sensitive     * RARE METHICILLIN RESISTANT STAPHYLOCOCCUS AUREUS  Culture, blood (routine x 2)     Status: None (Preliminary result)   Collection Time: 12/05/20  9:21 AM   Specimen: BLOOD RIGHT ARM  Result Value Ref Range Status   Specimen Description BLOOD RIGHT ARM  Final   Special Requests   Final    BOTTLES DRAWN AEROBIC ONLY Blood Culture adequate volume   Culture   Final    NO GROWTH 2 DAYS Performed at [facility], [address] [place], [place] [address]    Report Status PENDING  Incomplete  Culture, blood (routine x 2)     Status: None (Preliminary result)   Collection Time: 12/05/20  9:35 AM   Specimen: BLOOD RIGHT HAND  Result Value Ref Range Status   Specimen Description BLOOD RIGHT HAND  Final   Special Requests   Final    BOTTLES DRAWN AEROBIC AND ANAEROBIC Blood Culture results may not be optimal due to an inadequate volume of blood received in culture bottles   Culture   Final    NO GROWTH 2 DAYS Performed at [facility], [place] [address], [place], [place] [address]    Report Status PENDING  Incomplete         Radiology Studies: DG CHEST PORT 1 VIEW  Result Date: 12/05/2020 CLINICAL DATA:  Shortness of breath. EXAM: PORTABLE CHEST 1 VIEW COMPARISON:  12/01/2020 and prior studies FINDINGS: Tracheostomy tube in bilateral LOWER lung consolidation/atelectasis again noted. Bilateral pleural effusions are again noted. There is no evidence of pneumothorax.  Pulmonary vascular congestion is again identified. IMPRESSION: Bilateral LOWER lung consolidation/atelectasis and bilateral pleural effusions again noted. Electronically Signed   By: Margarette Canada M.D.   On: 12/05/2020 12:20        Scheduled Meds:  arformoterol  15 mcg Nebulization BID   budesonide  0.5 mg Nebulization BID   chlorhexidine gluconate (MEDLINE KIT)  15 mL Mouth Rinse BID   Chlorhexidine Gluconate Cloth  6 each Topical Daily   dexamethasone  4 mg Per Tube Q8H   diclofenac Sodium  2 g Topical QID   escitalopram  5 mg Per Tube Daily   feeding supplement (PROSource TF)  90 mL Per Tube BID   free water  150 mL Per Tube Q4H   furosemide  20 mg Intravenous BID   heparin injection (subcutaneous)  5,000 Units Subcutaneous Q8H   insulin aspart  0-9 Units Subcutaneous Q4H   levETIRAcetam  750 mg Per Tube BID   lidocaine  1 patch Transdermal Q24H   lidocaine  1 patch  Transdermal Q24H   mouth rinse  15 mL Mouth Rinse 10 times per day   midodrine  5 mg Per Tube BID WC   pantoprazole sodium  40 mg Per Tube Daily   scopolamine  1 patch Transdermal Q72H   senna-docusate  1 tablet Per Tube BID   Continuous Infusions:  sodium chloride     feeding supplement (JEVITY 1.5 CAL/FIBER) 1,000 mL (12/06/20 0032)     LOS: 9 days    Time spent: 35 mins.More than 50% of that time was spent in counseling and/or coordination of care.      Shelly Coss, MD [facility] P8/12/2020, 7:59 AM

## 2020-12-08 DIAGNOSIS — C329 Malignant neoplasm of larynx, unspecified: Secondary | ICD-10-CM | POA: Diagnosis not present

## 2020-12-08 LAB — GLUCOSE, CAPILLARY
Glucose-Capillary: 134 mg/dL — ABNORMAL HIGH (ref 70–99)
Glucose-Capillary: 139 mg/dL — ABNORMAL HIGH (ref 70–99)
Glucose-Capillary: 112 mg/dL — ABNORMAL HIGH (ref 70–99)
Glucose-Capillary: 89 mg/dL (ref 70–99)
Glucose-Capillary: 125 mg/dL — ABNORMAL HIGH (ref 70–99)
Glucose-Capillary: 136 mg/dL — ABNORMAL HIGH (ref 70–99)

## 2020-12-08 MED ORDER — OSMOLITE 1.5 CAL PO LIQD
237.0000 mL | Freq: Three times a day (TID) | ORAL | Status: DC
  Administered 2020-12-08 – 2020-12-12 (×13): 237 mL
  Filled 2020-12-08 (×2): qty 237

## 2020-12-08 MED ORDER — ADULT MULTIVITAMIN W/MINERALS CH
1.0000 | ORAL_TABLET | Freq: Every day | ORAL | Status: DC
  Administered 2020-12-08: 1 via ORAL
  Filled 2020-12-08: qty 1

## 2020-12-08 MED ORDER — SODIUM CHLORIDE 0.9 % IV BOLUS
250.0000 mL | Freq: Once | INTRAVENOUS | Status: AC
  Administered 2020-12-08: 250 mL via INTRAVENOUS

## 2020-12-08 NOTE — Progress Notes (Signed)
Speech Language Pathology Treatment: Dysphagia  Patient Details Name: Veronica Horton MRN: [id number] DOB: 12-Dec-1960 Today's Date: 12/08/2020 Time: 2130-8657 SLP Time Calculation (min) (ACUTE ONLY): 21 min  Assessment / Plan / Recommendation Clinical Impression  SLP follow up for skilled SLP treatment for PMSV tolerance and dysphagia management.  Pt denies coughing/choking with po intake and reports satisfaction with dietary advancement.   She is currently on 10 L of oxygen and has significantly congested cough  - with ability to expectorate secretions via trach per cue.  Oxygen saturation had declined with po attempts earlier with valve in place per RN, thus valve was removed.  Patient does not have to have pmsv in place with po per recommendations.  Pt admits to issues with congestion/secretions at baseline but not to extent as currently.  She advises her oxygen saturation remains at 90-91% typically at home.  She has a cuffed trach at this time  - thus advise consider changing to cuffless before she leaves hospital - MD made aware.    SLP reiterated importance of oral care, pH neutral status of water reviewed making it "safest to aspirate".  Observed pt with po intake of ice water without indication of aspiration, however she does have silent aspiration per MBS.   Thus pt will need to monitor for other indications of decreased po tolerance - including monitoring temperatures, secretion color, congestion, etc.   Given pt will go home with tube feeding, advised she modify her intake based on respiratory status. Use tube feeding, strict aspiration precautions and consumption of thin water only if having respiratory deficits to help maintain swallow function with least aspiration risk.    Recommend pt continue current diet with strict precautions. Note plan is for her to follow up with ENT at [facility]. Given potential surgical needs for her cancer, she would benefit from consideration for SLP after  surgery.  Pt provided with communication board to use PRN.  She has made excellent progress in terms of comprehending her dysphagia and needed precautions for airway protection. Pt agreeable to plan.       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 Of note, she was admitted with aspiration PNA 03/2020 and CT Head at that time showed extensive vasogenic edema R parietal/occipital region with concern for meningioma.  Patient refused an MRI at that time and was to follow with neurosurgery outpatient; she states that she did not follow up for insurance reasons.  CT head shows redemonstrated mass in the R parietal lobe with extensive vasogenic edema throughout the R parietal lobe. 8/1 vent via chronic trach; 8/2 transitioned to ATC.  Agitated and self-removed PEG 8/2. Completed radiation and chemo 05/31/20. Last PET 6/22 with decreased size of supraglottic mass.  Pt has been followed by SLP during prior admissions, undergoing MBS and PMV evaluations.   Last MBS was11/27/21- dys3/thin liquids was recommended. Recent MBS 8/3 with slightly deteriorated swallow and trace, silent aspiration of thin and nectar thick liquids.  Dys 1/honey thick recommended pending [place] conversations.      SLP Plan  Continue with current plan of care       Recommendations  Liquids provided via: Cup;Straw Medication Administration: Via alternative means (or crushed with puree) Supervision: Patient able to self feed Compensations: Small sips/bites Postural  Changes and/or Swallow Maneuvers: Seated upright 90 degrees      Patient may use Passy-Muir Speech Valve: Intermittently with supervision PMSV Supervision: Intermittent MD: Please consider changing trach tube to :  Cuffless         Oral Care Recommendations: Oral care BID Follow up Recommendations: Other (comment) SLP Visit Diagnosis: Dysphagia, pharyngeal phase (R13.13) Plan: Continue with current plan of care       GO              [name], MS [place] Office [phone] Pager [phone]   [name] 12/08/2020, 9:23 AM

## 2020-12-08 NOTE — Progress Notes (Signed)
Speech Language Pathology Treatment: Dysphagia  Patient Details Name: Veronica Horton MRN: [id number] DOB: Jul 07, 1960 Today's Date: 12/08/2020 Time: 0915-0940 SLP Time Calculation (min) (ACUTE ONLY): 25 min  Assessment / Plan / Recommendation Clinical Impression  Second visit as first was abbreviated by pt needing nursing care (washed, put in chair, medications given) and SLP needing to obtain anchoring mechanism for pt to use with PMSV given she has misplaced the ties.   SLP adapted small zip ties/rubber bands for pt to use which was effective. She verbalized its use as well as indications/contrandications of valve.  Prior to SLP placing valve with anchor in place, pt with congested coughing and expectoration of large amount of yellowish secretions via trach.  This allowed her to have a CLEAR voice with PMSV in place --- with improved oxygenation.   Note plans for potential discharge soon and thus SLP wanted to assure pt with understanding of importance of aspiration precautions given her silent nature of aspiration and ongoing need for cancer treatment/potential pending surgery.  Using teach back and written precautions, reviewed need to monitor temperatures, secretion colorings, coughing/congestion, etc and indication to call MD - stop po except thin water after mouth care with mod I.  Pt will dc with tube feeding per her report.   Hopefully pt's trach will be changed to a cufflless prior to dc - All education completed and pt on a diet that she appreciates at this time.    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 Of note, she was admitted with aspiration PNA 03/2020 and CT Head at that time showed extensive vasogenic edema R  parietal/occipital region with concern for meningioma.  Patient refused an MRI at that time and was to follow with neurosurgery outpatient; she states that she did not follow up for insurance reasons.  CT head shows redemonstrated mass in the R parietal lobe with extensive vasogenic edema throughout the R parietal lobe. 8/1 vent via chronic trach; 8/2 transitioned to ATC.  Agitated and self-removed PEG 8/2. Completed radiation and chemo 05/31/20. Last PET 6/22 with decreased size of supraglottic mass.  Pt has been followed by SLP during prior admissions, undergoing MBS and PMV evaluations.   Last MBS was11/27/21- dys3/thin liquids was recommended. Recent MBS 8/3 with slightly deteriorated swallow and trace, silent aspiration of thin and nectar thick liquids.  Dys 1/honey thick recommended pending [place] conversations.      SLP Plan  Continue with current plan of care       Recommendations  Liquids provided via: Cup;Straw Medication Administration: Via alternative means (or crushed with puree) Supervision: Patient able to self feed Compensations: Small sips/bites Postural Changes and/or Swallow Maneuvers: Seated upright 90 degrees      Patient may use Passy-Muir Speech Valve: Intermittently with supervision PMSV Supervision: Intermittent MD: Please consider changing trach tube to : Cuffless         Oral Care Recommendations: Oral care prior to ice chip/H20 Follow up Recommendations: Other (comment) SLP Visit Diagnosis: Dysphagia, pharyngeal phase (R13.13);[place] (R49.1) Plan: Continue with current plan of care       GO              [name], MS [place] Office [phone] Pager [phone]   [name]  12/08/2020, 11:01 AM

## 2020-12-08 NOTE — Plan of Care (Signed)

## 2020-12-08 NOTE — Progress Notes (Addendum)
Dr Tawanna Solo messaged to inform him of pt taking her IV's out earlier today (she stated they were bent and she took them out), and refused to have IV replaced.   Also informed of pt becoming agitated and yelling out (as best she could with trach/PMV on) this evening around 4:30 after RT changed her trach.  Unit charge nurse had to get security to come up to unit.Marland KitchenMarland KitchenMarland KitchenMarland Kitchenpt wanted to go home and was having a fit that she was going to go home today or else.  Pt finally calmed down, and took the advise from staff (nursing and RT) that  she was not ready to go home yet.     Also informed pt trach changed by RT and trach/pt doing well this evening. No respiratory issues.   Pt's sister [name] called earlier today and she was made aware that pt would not be going home today, not ready medically and that her trach was being changed today and told MD and RT stated that pt would have to be observed al least 24 hours after trach changed.   Sister understood and stated she would not send anyone to come and get her until she was told by hospital staff that she was ready to go home.

## 2020-12-08 NOTE — Progress Notes (Signed)
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 Neurosurgery was following for possible resection but has recommended outpatient follow up.ENT was also following,now recommending outpatient follow up at [facility].Palliative care  were also following for goals of care discussion.  PT/OT consulted.  Discharge planning.  Assessment & Plan:   Active Problems:   Laryngeal cancer ([place])   Status epilepticus (HCC)   Seizure ([place])   Meningioma/enhancing mass with surrounding edema: Currently on Decadron, Keppra.  Neurosurgery reevaluated her on 12/07/2020 and recommended outpatient follow-up.  As per them, no need for intervention emergently.  Tonic-clonic seizures: Most likely secondary to meningioma.  Currently on Keppra.  EEG did not show any specific seizure pathology.  No seizure episodes recently.  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  PEG tube dislodged and was replaced on 8/2.  ENT was following.  ENT  had recommended referral to head/ neck surgery in [place]  for discussion of salvage laryngectomy, bilateral neck dissection.   Patient will also follow up with ENT,Dr. Isaias Cowman as an outpatient. Her oncologist Dr. Chryl Heck mentioned that she is not a candidate for aggressive treatment, there is no plan for initiating chemotherapy for now.  I also discussed with Dr. Isidore Moos, radiation oncology, recommend outpatient follow-up saying there is no plan for emergent radiation  treatment On discharge, patient should be provided with adequate suction instrument, adequate tracheostomy tubes. I have requested Dr. Isaias Cowman to put orders to make her trach to cuffless so that RT can doit  MRSA in tracheal aspirate: Tracheal aspirate showed rare MRSA.  She is not septic.  Procalcitonin negative.  We discontinued antibiotics.  AKI on CKD stage IIIb: Kidney function  improved,now at baseline  Urinary retention: Foley inserted on 7/3.  Now removed. started on Flomax  Chronic hypotension: On midodrine  History of DVT: On Eliquis.  Goals of care: Poor quality of life, multiple comorbidities, including trach/PEG.  Palliative care has been following for goals of care.  Remains DNR.  Current recommendation is to continue full scope of treatment.  Debility/deconditioning: We requested for PT/OT evaluation.  Patient lives with her family at home.recommended hH  Morbid obesity: BMI 37.2  Nutrition Problem: Inadequate oral intake Etiology: dysphagia, decreased appetite      DVT prophylaxis:Heparin  Code Status: DNR Family Communication: Called and discussed with sister on phone on 12/07/20 Status is: Inpatient  Remains inpatient appropriate because:Inpatient level of care appropriate due to severity of illness  Dispo: The patient is from: Home              Anticipated d/c is to: Home              Patient currently is not medically stable to d/c.   Difficult to place patient No      Consultants: PCCM, oncology, ENT, neurosurgery  Procedures: Intubation  Antimicrobials:  Anti-infectives (From admission, onward)    Start     Dose/Rate Route Frequency Ordered Stop   12/07/20 1200  vancomycin (VANCOREADY) IVPB 1500 mg/300 mL  Status:  Discontinued  1,500 mg 150 mL/hr over 120 Minutes Intravenous Every 48 hours 12/05/20 1001 12/06/20 1241   12/05/20 1200  vancomycin (VANCOREADY) IVPB 2000 mg/400 mL        2,000 mg 200 mL/hr over 120 Minutes Intravenous   Once 12/05/20 1001 12/05/20 1439   11/29/20 1800  azithromycin (ZITHROMAX) 500 mg in sodium chloride 0.9 % 250 mL IVPB  Status:  Discontinued        500 mg 250 mL/hr over 60 Minutes Intravenous Every 24 hours 11/29/20 0401 11/29/20 0905   11/29/20 0800  cefTRIAXone (ROCEPHIN) 1 g in sodium chloride 0.9 % 100 mL IVPB  Status:  Discontinued        1 g 200 mL/hr over 30 Minutes Intravenous Every 24 hours 11/29/20 0401 11/29/20 0905   11/28/20 2100  vancomycin (VANCOREADY) IVPB 1750 mg/350 mL  Status:  Discontinued        1,750 mg 175 mL/hr over 120 Minutes Intravenous  Once 11/28/20 2024 11/28/20 2029   11/28/20 2100  vancomycin (VANCOREADY) IVPB 1750 mg/350 mL  Status:  Discontinued        1,750 mg 175 mL/hr over 120 Minutes Intravenous Every 48 hours 11/28/20 2029 11/29/20 0349   11/28/20 2045  metroNIDAZOLE (FLAGYL) IVPB 500 mg  Status:  Discontinued        500 mg 100 mL/hr over 60 Minutes Intravenous Every 8 hours 11/28/20 2033 11/29/20 0349   11/28/20 2015  levofloxacin (LEVAQUIN) IVPB 750 mg        750 mg 100 mL/hr over 90 Minutes Intravenous  Once 11/28/20 2012 11/28/20 2204       Subjective:  Patient seen and examined at the bedside this morning.  Hemodynamically stable.  On 8 L of oxygen per minute.  She was having some secretions from the trach.  Otherwise she looks comfortable, she is eager to go home.  Denies any new complaints.  Objective: Vitals:   12/08/20 0500 12/08/20 0519 12/08/20 0642 12/08/20 0752  BP:    (!) 117/51  Pulse:  (!) 47  63  Resp:  16  11  Temp:   98.2 F (36.8 C) 98.8 F (37.1 C)  TempSrc:   Oral Oral  SpO2:  96%    Weight: 105.2 kg     Height:        Intake/Output Summary (Last 24 hours) at 12/08/2020 1120 Last data filed at 12/08/2020 4782 Gross per 24 hour  Intake --  Output 2425 ml  Net -2425 ml   Filed Weights   12/06/20 0500 12/07/20 0500 12/08/20 0500  Weight: 104.6 kg 104.4 kg 105.2 kg    Examination:   General exam: Overall  comfortable, not in distress, obese HEENT: Trach Respiratory system:  no wheezes or crackles  Cardiovascular system: S1 & S2 heard, RRR.  Gastrointestinal system: Abdomen is nondistended, soft and nontender.  PEG Central nervous system: Alert and oriented Extremities: No edema, no clubbing ,no cyanosis Skin: No rashes, no ulcers,no icterus    Data Reviewed: I have personally reviewed following labs and imaging studies  CBC: Recent Labs  Lab 12/02/20 0311 12/03/20 0349 12/04/20 0253 12/05/20 0337 12/07/20 0436  WBC 7.3 7.3 9.3 10.8* 12.5*  NEUTROABS  --   --   --   --  10.7*  HGB 10.1* 10.1* 10.2* 10.4* 9.8*  HCT 30.0* 30.8* 30.9* 31.0* 28.9*  MCV 97.4 98.7 98.4 98.4 100.0  PLT 201 160 161 149* 956*   Basic Metabolic Panel: Recent Labs  Lab  12/02/20 0311 12/03/20 0349 12/04/20 0253 12/05/20 0337 12/07/20 0436  NA 136 137 138 138 134*  K 3.6 3.7 3.6 4.0 3.9  CL 99 101 100 100 93*  CO2 $Re'26 25 30 30 30  'hfY$ GLUCOSE 127* 141* 113* 120* 107*  BUN 39* 45* 47* 50* 55*  CREATININE 2.01* 1.94* 1.93* 1.92* 1.79*  CALCIUM 9.0 8.5* 8.6* 8.5* 9.0   GFR: Estimated Creatinine Clearance: 41 mL/min (A) (by C-G formula based on SCr of 1.79 mg/dL (H)). Liver Function Tests: Recent Labs  Lab 12/02/20 0311 12/03/20 0349 12/04/20 0253 12/05/20 0337  AST 29 42* 51* 34  ALT 14 26 36 30  ALKPHOS 34* 31* 32* 30*  BILITOT 0.8 0.8 0.9 0.8  PROT 6.0* 5.6* 5.9* 5.7*  ALBUMIN 2.6* 2.6* 2.8* 2.7*   No results for input(s): LIPASE, AMYLASE in the last 168 hours. No results for input(s): AMMONIA in the last 168 hours. Coagulation Profile: No results for input(s): INR, PROTIME in the last 168 hours. Cardiac Enzymes: No results for input(s): CKTOTAL, CKMB, CKMBINDEX, TROPONINI in the last 168 hours. BNP (last 3 results) No results for input(s): PROBNP in the last 8760 hours. HbA1C: No results for input(s): HGBA1C in the last 72 hours. CBG: Recent Labs  Lab 12/07/20 1705 12/07/20 2103  12/07/20 2339 12/08/20 0337 12/08/20 0751  GLUCAP 115* 123* 107* 134* 139*   Lipid Profile: No results for input(s): CHOL, HDL, LDLCALC, TRIG, CHOLHDL, LDLDIRECT in the last 72 hours. Thyroid Function Tests: No results for input(s): TSH, T4TOTAL, FREET4, T3FREE, THYROIDAB in the last 72 hours. Anemia Panel: No results for input(s): VITAMINB12, FOLATE, FERRITIN, TIBC, IRON, RETICCTPCT in the last 72 hours. Sepsis Labs: Recent Labs  Lab 12/05/20 0921 12/05/20 1244 12/06/20 0655 12/07/20 0436  PROCALCITON <0.10  --  <0.10 <0.10  LATICACIDVEN 1.4 1.5  --   --     Recent Results (from the past 240 hour(s))  Resp Panel by RT-PCR (Flu A&B, Covid) Nasopharyngeal Swab     Status: None   Collection Time: 11/28/20  6:37 PM   Specimen: Nasopharyngeal Swab; Nasopharyngeal(NP) swabs in vial transport medium  Result Value Ref Range Status   SARS Coronavirus 2 by RT PCR NEGATIVE NEGATIVE Final    Comment: (NOTE) SARS-CoV-2 target nucleic acids are NOT DETECTED.  The SARS-CoV-2 RNA is generally detectable in upper respiratory specimens during the acute phase of infection. The lowest concentration of SARS-CoV-2 viral copies this assay can detect is 138 copies/mL. A negative result does not preclude SARS-Cov-2 infection and should not be used as the sole basis for treatment or other patient management decisions. A negative result may occur with  improper specimen collection/handling, submission of specimen other than nasopharyngeal swab, presence of viral mutation(s) within the areas targeted by this assay, and inadequate number of viral copies(<138 copies/mL). A negative result must be combined with clinical observations, patient history, and epidemiological information. The expected result is Negative.  Fact Sheet for Patients:  EntrepreneurPulse.com.au  Fact Sheet for Healthcare Providers:  IncredibleEmployment.be  This test is no t yet approved  or cleared by the [place] FDA and  has been authorized for detection and/or diagnosis of SARS-CoV-2 by FDA under an Emergency Use Authorization (EUA). This EUA will remain  in effect (meaning this test can be used) for the duration of the COVID-19 declaration under Section 564(b)(1) of the Act, 21 U.S.C.section 360bbb-3(b)(1), unless the authorization is terminated  or revoked sooner.       Influenza A by  PCR NEGATIVE NEGATIVE Final   Influenza B by PCR NEGATIVE NEGATIVE Final    Comment: (NOTE) The Xpert Xpress SARS-CoV-2/FLU/RSV plus assay is intended as an aid in the diagnosis of influenza from Nasopharyngeal swab specimens and should not be used as a sole basis for treatment. Nasal washings and aspirates are unacceptable for Xpert Xpress SARS-CoV-2/FLU/RSV testing.  Fact Sheet for Patients: EntrepreneurPulse.com.au  Fact Sheet for Healthcare Providers: IncredibleEmployment.be  This test is not yet approved or cleared by the [place] FDA and has been authorized for detection and/or diagnosis of SARS-CoV-2 by FDA under an Emergency Use Authorization (EUA). This EUA will remain in effect (meaning this test can be used) for the duration of the COVID-19 declaration under Section 564(b)(1) of the Act, 21 U.S.C. section 360bbb-3(b)(1), unless the authorization is terminated or revoked.  Performed at [facility], [address] [place], [place] [address]   Culture, blood (Routine X 2) w Reflex to ID Panel     Status: None   Collection Time: 11/28/20  8:29 PM   Specimen: BLOOD LEFT HAND  Result Value Ref Range Status   Specimen Description BLOOD LEFT HAND  Final   Special Requests   Final    BOTTLES DRAWN AEROBIC AND ANAEROBIC Blood Culture adequate volume   Culture   Final    NO GROWTH 5 DAYS Performed at [facility], [address], [place], [place] [address]    Report Status 12/03/2020 FINAL  Final  Culture, blood (Routine X  2) w Reflex to ID Panel     Status: None   Collection Time: 11/28/20  8:29 PM   Specimen: BLOOD RIGHT HAND  Result Value Ref Range Status   Specimen Description BLOOD RIGHT HAND  Final   Special Requests   Final    BOTTLES DRAWN AEROBIC AND ANAEROBIC Blood Culture adequate volume   Culture   Final    NO GROWTH 5 DAYS Performed at [facility], [address] [place], [place] [address]    Report Status 12/03/2020 FINAL  Final  MRSA Next Gen by PCR, Nasal     Status: Abnormal   Collection Time: 11/29/20  3:01 AM   Specimen: Nasal Mucosa; Nasal Swab  Result Value Ref Range Status   MRSA by PCR Next Gen DETECTED (A) NOT DETECTED Final    Comment: RESULT CALLED TO, READ BACK BY AND VERIFIED WITH: Hansel Feinstein RN, AT 0998 11/29/20 D. VANHOOK (NOTE) The GeneXpert MRSA Assay (FDA approved for NASAL specimens only), is one component of a comprehensive MRSA colonization surveillance program. It is not intended to diagnose MRSA infection nor to guide or monitor treatment for MRSA infections. Test performance is not FDA approved in patients less than 41 years old. Performed at [facility], [place] [address] [place], [place] [address]   Culture, Respiratory w Gram Stain     Status: None   Collection Time: 11/29/20  8:17 AM   Specimen: Tracheal Aspirate; Respiratory  Result Value Ref Range Status   Specimen Description TRACHEAL ASPIRATE  Final   Special Requests NONE  Final   Gram Stain   Final    RARE WBC PRESENT,BOTH PMN AND MONONUCLEAR NO ORGANISMS SEEN    Culture   Final    RARE METHICILLIN RESISTANT STAPHYLOCOCCUS AUREUS No Pseudomonas species isolated Performed at [facility], [address], [place], [place] [address]    Report Status 12/01/2020 FINAL  Final   Organism ID, Bacteria METHICILLIN RESISTANT STAPHYLOCOCCUS AUREUS  Final      Susceptibility   Methicillin resistant staphylococcus aureus - MIC*    CIPROFLOXACIN >=8 RESISTANT Resistant     ERYTHROMYCIN >=8  RESISTANT Resistant     GENTAMICIN <=0.5 SENSITIVE Sensitive     OXACILLIN >=4 RESISTANT Resistant     TETRACYCLINE <=1 SENSITIVE Sensitive     VANCOMYCIN 1 SENSITIVE Sensitive     TRIMETH/SULFA <=10 SENSITIVE Sensitive     CLINDAMYCIN <=0.25 SENSITIVE Sensitive     RIFAMPIN <=0.5 SENSITIVE Sensitive     Inducible Clindamycin NEGATIVE Sensitive     * RARE METHICILLIN RESISTANT STAPHYLOCOCCUS AUREUS  Culture, blood (routine x 2)     Status: None (Preliminary result)   Collection Time: 12/05/20  9:21 AM   Specimen: BLOOD RIGHT ARM  Result Value Ref Range Status   Specimen Description BLOOD RIGHT ARM  Final   Special Requests   Final    BOTTLES DRAWN AEROBIC ONLY Blood Culture adequate volume   Culture   Final    NO GROWTH 3 DAYS Performed at [facility], [address], [place], [place] [address]    Report Status PENDING  Incomplete  Culture, blood (routine x 2)     Status: None (Preliminary result)   Collection Time: 12/05/20  9:35 AM   Specimen: BLOOD RIGHT HAND  Result Value Ref Range Status   Specimen Description BLOOD RIGHT HAND  Final   Special Requests   Final    BOTTLES DRAWN AEROBIC AND ANAEROBIC Blood Culture results may not be optimal due to an inadequate volume of blood received in culture bottles   Culture   Final    NO GROWTH 3 DAYS Performed at [facility], [place] [address], [place], [place] [address]    Report Status PENDING  Incomplete         Radiology Studies: No results found.      Scheduled Meds:  arformoterol  15 mcg Nebulization BID   budesonide  0.5 mg Nebulization BID   chlorhexidine gluconate (MEDLINE KIT)  15 mL Mouth Rinse BID   Chlorhexidine Gluconate Cloth  6 each Topical Daily   dexamethasone  4 mg Per Tube Q8H   diclofenac Sodium  2 g Topical QID   escitalopram  5 mg Per Tube Daily   feeding supplement (PROSource TF)  90 mL Per Tube BID   free water  150 mL Per Tube Q4H   furosemide  20 mg Oral BID   heparin injection  (subcutaneous)  5,000 Units Subcutaneous Q8H   insulin aspart  0-9 Units Subcutaneous Q4H   levETIRAcetam  750 mg Per Tube BID   lidocaine  1 patch Transdermal Q24H   lidocaine  1 patch Transdermal Q24H   mouth rinse  15 mL Mouth Rinse 10 times per day   midodrine  5 mg Per Tube BID WC   pantoprazole sodium  40 mg Per Tube Daily   scopolamine  1 patch Transdermal Q72H   senna-docusate  1 tablet Per Tube BID   tamsulosin  0.4 mg Oral Daily   Continuous Infusions:  sodium chloride     feeding supplement (JEVITY 1.5 CAL/FIBER) 1,000 mL (12/07/20 2243)     LOS: 10 days    Time spent: 35 mins.More than 50% of that time was spent in counseling and/or coordination of care.      Shelly Coss, MD [facility] P8/01/2021, 11:20 AM

## 2020-12-08 NOTE — Progress Notes (Signed)
Occupational Therapy Treatment Patient Details Name: Veronica Horton MRN: [id number] DOB: 1960-10-20 Today's Date: 12/08/2020    History of present illness Kyri Shader is a 60 y.o. 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 HUT:MLYYTKPT right convexity meningioma measuring 3.1 x 3.9 x 2.6 cm  with moderate vasogenic edema within the posterior right hemisphere  and 3 mm of leftward midline shift--currently not a surgical candidate due to kidney function.   OT comments  Patient continues to progress toward patient focused goals.  She is stating that she is going home later today.  No discharge order seen.  Patient has 24 hour assist as needed at home, and is probably getting close to her baseline.  OT to continue in the acute setting to maximize her functional status for a return home with [facility] services.    Follow Up Recommendations  Home health OT;Supervision/Assistance - 24 hour    Equipment Recommendations  Wheelchair (measurements OT);Wheelchair cushion (measurements OT)    Recommendations for Other Services      Precautions / Restrictions Precautions Precautions: Fall Precaution Comments: trach, PEG, foley Restrictions Weight Bearing Restrictions: No       Mobility Bed Mobility Overal bed mobility: Needs Assistance         Sit to supine: Min guard   General bed mobility comments: min guard for lines/leads.  Able to lie back with HOB at 30 degrees.  Able to bring feet up without assist. Patient Response: Cooperative  Transfers Overall transfer level: Needs assistance Equipment used: 1 person hand held assist Transfers: Sit to/from [facility] Sit to Stand: Min assist Stand pivot transfers: Min assist;Mod assist             Balance Overall balance assessment: Needs assistance         Standing balance support: Single extremity supported Standing balance-Leahy Scale: Poor Standing balance comment: reliant on external support                           ADL either performed or assessed with clinical judgement   ADL   Eating/Feeding: Sitting;Set up   Grooming: Set up;Sitting;Wash/dry face;Wash/dry Teacher, music: Minimal assistance;Moderate assistance;Stand-pivot Toilet Transfer Details (indicate cue type and reason): HHA                 Vision Patient Visual Report: No change from baseline     Perception     Praxis      Cognition Arousal/Alertness: Awake/alert Behavior During Therapy: WFL for tasks assessed/performed Overall Cognitive Status: Within Functional Limits for tasks assessed                                                            Pertinent Vitals/ Pain       Pain Assessment: No/denies pain Faces Pain Scale: No hurt Pain Intervention(s): Monitored during session  Frequency  Min 2X/week        Progress Toward Goals  OT Goals(current goals can now be found in the care plan section)  Progress towards OT goals: Progressing toward goals  Acute Rehab OT Goals Patient Stated Goal: home OT Goal Formulation: With patient Time For Goal Achievement: 12/20/20 Potential to Achieve Goals: Good  Plan Discharge plan remains appropriate    Co-evaluation                 AM-PAC OT "6 Clicks" Daily Activity     Outcome Measure   Help from another person eating meals?: None Help from another person taking care of personal grooming?: A Little Help from another person toileting, which includes using toliet, bedpan, or urinal?: A Lot Help from another person bathing (including washing, rinsing, drying)?: A Lot Help  from another person to put on and taking off regular upper body clothing?: A Lot Help from another person to put on and taking off regular lower body clothing?: A Lot 6 Click Score: 15    End of Session Equipment Utilized During Treatment: Oxygen  OT Visit Diagnosis: Unsteadiness on feet (R26.81);Other abnormalities of gait and mobility (R26.89);Muscle weakness (generalized) (M62.81);Pain   Activity Tolerance Patient tolerated treatment well   Patient Left in bed;with call bell/phone within reach;with bed alarm set   Nurse Communication Other (comment) (patient pulled out both IV's)        Time: 1311-1330 OT Time Calculation (min): 19 min  Charges: OT General Charges $OT Visit: 1 Visit OT Treatments $Self Care/Home Management : 8-22 mins  12/08/2020  Rich, OTR/L  Acute Rehabilitation Services  Office:  [place] 12/08/2020, 2:05 PM

## 2020-12-08 NOTE — Progress Notes (Signed)
Trach changed from #6 Shiley Flex Cuffed to #6 Shiley Flex Uncuffed. No complications encountered. Placement confirmed with C02 detector. Vital signs stable.

## 2020-12-09 ENCOUNTER — Inpatient Hospital Stay (HOSPITAL_COMMUNITY): Payer: Medicaid Other

## 2020-12-09 DIAGNOSIS — C329 Malignant neoplasm of larynx, unspecified: Secondary | ICD-10-CM | POA: Diagnosis not present

## 2020-12-09 LAB — DIFFERENTIAL
Abs Immature Granulocytes: 0.15 10*3/uL — ABNORMAL HIGH (ref 0.00–0.07)
Basophils Absolute: 0 10*3/uL (ref 0.0–0.1)
Basophils Relative: 0 %
Eosinophils Absolute: 0 10*3/uL (ref 0.0–0.5)
Eosinophils Relative: 0 %
Immature Granulocytes: 1 %
Lymphocytes Relative: 4 %
Lymphs Abs: 0.4 10*3/uL — ABNORMAL LOW (ref 0.7–4.0)
Monocytes Absolute: 0.8 10*3/uL (ref 0.1–1.0)
Monocytes Relative: 7 %
Neutro Abs: 10.6 10*3/uL — ABNORMAL HIGH (ref 1.7–7.7)
Neutrophils Relative %: 88 %

## 2020-12-09 LAB — BASIC METABOLIC PANEL
Anion gap: 10 (ref 5–15)
BUN: 60 mg/dL — ABNORMAL HIGH (ref 6–20)
CO2: 31 mmol/L (ref 22–32)
Calcium: 8.7 mg/dL — ABNORMAL LOW (ref 8.9–10.3)
Chloride: 94 mmol/L — ABNORMAL LOW (ref 98–111)
Creatinine, Ser: 1.92 mg/dL — ABNORMAL HIGH (ref 0.44–1.00)
GFR, Estimated: 29 mL/min — ABNORMAL LOW (ref >60)
Glucose, Bld: 130 mg/dL — ABNORMAL HIGH (ref 70–99)
Potassium: 4.1 mmol/L (ref 3.5–5.1)
Sodium: 135 mmol/L (ref 135–145)

## 2020-12-09 LAB — CBC
HCT: 19.4 % — ABNORMAL LOW (ref 36.0–46.0)
Hemoglobin: 6.5 g/dL — CL (ref 12.0–15.0)
MCH: 33.5 pg (ref 26.0–34.0)
MCHC: 33.5 g/dL (ref 30.0–36.0)
MCV: 100 fL (ref 80.0–100.0)
Platelets: 110 10*3/uL — ABNORMAL LOW (ref 150–400)
RBC: 1.94 MIL/uL — ABNORMAL LOW (ref 3.87–5.11)
RDW: 17.2 % — ABNORMAL HIGH (ref 11.5–15.5)
WBC: 12 10*3/uL — ABNORMAL HIGH (ref 4.0–10.5)
nRBC: 0 % (ref 0.0–0.2)

## 2020-12-09 LAB — GLUCOSE, CAPILLARY
Glucose-Capillary: 110 mg/dL — ABNORMAL HIGH (ref 70–99)
Glucose-Capillary: 112 mg/dL — ABNORMAL HIGH (ref 70–99)
Glucose-Capillary: 113 mg/dL — ABNORMAL HIGH (ref 70–99)
Glucose-Capillary: 190 mg/dL — ABNORMAL HIGH (ref 70–99)
Glucose-Capillary: 89 mg/dL (ref 70–99)
Glucose-Capillary: 96 mg/dL (ref 70–99)

## 2020-12-09 LAB — ABO/RH: ABO/RH(D): A POS

## 2020-12-09 LAB — HEMOGLOBIN AND HEMATOCRIT, BLOOD
HCT: 19.6 % — ABNORMAL LOW (ref 36.0–46.0)
Hemoglobin: 6.5 g/dL — CL (ref 12.0–15.0)

## 2020-12-09 MED ORDER — OXYCODONE HCL 5 MG PO TABS: 5.0000 mg | ORAL_TABLET | Freq: Four times a day (QID) | ORAL | Status: DC | PRN

## 2020-12-09 MED ORDER — FUROSEMIDE 20 MG PO TABS: 20.0000 mg | ORAL_TABLET | Freq: Two times a day (BID) | ORAL | Status: DC

## 2020-12-09 MED ORDER — FUROSEMIDE 10 MG/ML IJ SOLN
20.0000 mg | Freq: Two times a day (BID) | INTRAMUSCULAR | Status: DC
  Administered 2020-12-09 – 2020-12-10 (×2): 20 mg via INTRAVENOUS
  Filled 2020-12-09 (×2): qty 4

## 2020-12-09 MED ORDER — SODIUM CHLORIDE 0.9 % IV SOLN
3.0000 g | Freq: Four times a day (QID) | INTRAVENOUS | Status: DC
  Administered 2020-12-09 – 2020-12-13 (×16): 3 g via INTRAVENOUS
  Filled 2020-12-09 (×19): qty 8

## 2020-12-09 MED ORDER — GUAIFENESIN-DM 100-10 MG/5ML PO SYRP
10.0000 mL | ORAL_SOLUTION | ORAL | Status: DC | PRN
  Administered 2020-12-09: 10 mL
  Filled 2020-12-09: qty 10

## 2020-12-09 MED ORDER — HYDROMORPHONE HCL 1 MG/ML IJ SOLN
0.5000 mg | INTRAMUSCULAR | Status: DC | PRN
  Administered 2020-12-09: 0.5 mg via INTRAVENOUS
  Filled 2020-12-09: qty 0.5

## 2020-12-09 MED ORDER — OXYCODONE HCL 5 MG PO TABS: 10.0000 mg | ORAL_TABLET | Freq: Four times a day (QID) | ORAL | Status: DC | PRN

## 2020-12-09 MED ORDER — ADULT MULTIVITAMIN LIQUID CH
15.0000 mL | Freq: Every day | ORAL | Status: DC
  Administered 2020-12-09 – 2020-12-12 (×4): 15 mL
  Filled 2020-12-09 (×5): qty 15

## 2020-12-09 MED ORDER — ACETAMINOPHEN 325 MG PO TABS
650.0000 mg | ORAL_TABLET | Freq: Four times a day (QID) | ORAL | Status: DC | PRN
  Administered 2020-12-09 – 2020-12-12 (×4): 650 mg
  Filled 2020-12-09 (×5): qty 2

## 2020-12-09 NOTE — Progress Notes (Signed)
To ensure accuracy, Dr [place] Crane is repeating H&H and also ordered a type&screen.

## 2020-12-09 NOTE — Progress Notes (Signed)
PT Cancellation Note  Patient Details Name: Veronica Horton MRN: [id number] DOB: 10/07/1960   Cancelled Treatment:    Reason Eval/Treat Not Completed: Patient not medically ready Patient with Hgb 6.5 and has not received transfusion. Patient with newly found rectal hematoma and per RN, patient with increased pain. PT will re-attempt as time allows.   Erik Nessel A. Gilford Rile PT, DPT Acute Rehabilitation Services Pager [phone] Office [phone]    Linna Hoff 12/09/2020, 2:53 PM

## 2020-12-09 NOTE — Progress Notes (Signed)
IV unasyn administrated, no hives, itching or other s/s of allergic reaction, however pt states IV site feels tight and L forearm now has nonpitting edema, will remove IV and place orders for IV team for new site dt pt being hard stick

## 2020-12-09 NOTE — Progress Notes (Addendum)
Pharmacy Antibiotic Note  Veronica Horton is a 60 y.o. female admitted on 11/28/2020 with aspiration pneumonia.  Pharmacy has been consulted for unasyn dosing.  Noted history of rash/hives to PCN that occurred over 10 years ago. Did not receive any PCNs recently per Epic. RN aware and will monitor closely after first dose. ClCr 38 ml/min, will watch trend.  Plan: Ampicillin/sulbactam 3g Q 6 hr Monitor cultures, clinical status, renal fx Narrow abx as able and f/u duration     Height: 5\' 6"  (167.6 cm) Weight: 105 kg (231 lb 7.7 oz) IBW/kg (Calculated) : 59.3  Temp (24hrs), Avg:98.5 F (36.9 C), Min:98.4 F (36.9 C), Max:98.6 F (37 C)  Recent Labs  Lab 12/03/20 0349 12/04/20 0253 12/05/20 0337 12/05/20 0921 12/05/20 1244 12/07/20 0436 12/09/20 0125 12/09/20 0424  WBC 7.3 9.3 10.8*  --   --  12.5*  --  12.0*  CREATININE 1.94* 1.93* 1.92*  --   --  1.79* 1.92*  --   LATICACIDVEN  --   --   --  1.4 1.5  --   --   --     Estimated Creatinine Clearance: 38.2 mL/min (A) (by C-G formula based on SCr of 1.92 mg/dL (H)).    Allergies  Allergen Reactions   Codeine Hives and Itching    Reports itching only per RN   Penicillins Hives    Did it involve swelling of the face/tongue/throat, SOB, or low BP? No Did it involve sudden or severe rash/hives, skin peeling, or any reaction on the inside of your mouth or nose? Yes Did you need to seek medical attention at a hospital or doctor's office? Unknown When did it last happen?      Over 10 years If all above answers are "NO", may proceed with cephalosporin use.     Antimicrobials this admission: Amp/sulb 8/11 >>     Microbiology results: 8/7 BCx: ngtd 7/31 Bcx: ngtd   8/1 MRSA PCR: +  Thank you for allowing pharmacy to be a part of this patient's care.  Benetta Spar, PharmD, BCPS, BCCP Clinical Pharmacist  Please check AMION for all [place] phone numbers After 10:00 PM, call [place] [phone]

## 2020-12-09 NOTE — Plan of Care (Signed)
  Problem: Clinical Measurements: Goal: Will remain free from infection Outcome: Progressing Goal: Diagnostic test results will improve Outcome: Progressing Goal: Respiratory complications will improve Outcome: Progressing Goal: Cardiovascular complication will be avoided Outcome: Progressing   

## 2020-12-09 NOTE — Progress Notes (Addendum)
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 Neurosurgery was following for possible resection but has recommended outpatient follow up.ENT was also following,now recommending outpatient follow up at [facility].Palliative care  were also following for goals of care discussion.  PT/OT consulted and recommended home health.  Hospital course remarkable for persistent requirement of high amount of oxygen, she is on 5 L at baseline.  She was also complaining of abdominal wall pain.  CT abdomen/pelvis done today showed rectus sheath hematoma.  Imaging also suspicious for aspiration pneumonia.  Started on antibiotic.  Assessment & Plan:   Active Problems:   Laryngeal cancer (HCC)   Status epilepticus ([place])   Seizure ([place])   Acute on chronic hypoxic respiratory failure: Chronically on 5 L of oxygen at home through tracheostomy collar.  Currently requiring 8 to 10 L of oxygen per minute.  Chest abdomen/pelvis showed possible aspiration pneumonia on the lower lungs.  Started on Unasyn.  We will continue to try to wean the oxygen to her baseline.  We will also check chest x-ray.  Rectus sheath hematoma/acute blood loss anemia: Patient was complaining of lower abdominal pain since last 1 to 2 days.  Hemoglobin noted to be dropped to 6.5 today.  Abdominal CT/pelvis showed large right rectus sheath hematoma measuring 13.7 x9.4 x 9.2 cm. Transfused with a unit of PRBC today.  Will check CBC tomorrow.  Started on IV Dilaudid, oral  oxycodone.  Meningioma/enhancing mass with surrounding edema: Currently on Decadron, Keppra.  Neurosurgery reevaluated her on 12/07/2020 and recommended outpatient follow-up.  As per them, no need for intervention emergently.  Tonic-clonic seizures: Most likely secondary to meningioma.  Currently on Keppra.  EEG did not show any specific seizure pathology.  No seizure episodes recently.  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  PEG tube dislodged and was replaced on 8/2.  ENT was following.  ENT  had recommended referral to head/ neck surgery in [place]/[facility]  for discussion of salvage laryngectomy, bilateral neck dissection.   Patient will also follow up with ENT,Dr. Isaias Cowman as an outpatient. Her oncologist Dr. Chryl Heck mentioned that she is not a candidate for aggressive treatment, there is no plan for initiating chemotherapy for now.  I also discussed with Dr. Isidore Moos, radiation oncology, recommend outpatient follow-up saying there is no plan for emergent radiation treatment On discharge, patient should be provided with adequate suction instrument, adequate tracheostomy tubes. I have requested Dr. Isaias Cowman to put orders to make her trach to cuffless so that RT can doi  AKI on CKD stage IIIb: Kidney function  improved,now at baseline  Urinary retention: Foley inserted on 7/3.  Now removed. started on Flomax  Chronic hypotension: On midodrine  History of DVT: She was on Eliquis previously, not on Eliquis currently.  We will continue to hold because of rectus sheath hematoma.  Goals of care: Poor quality of life, multiple comorbidities, including trach/PEG.  Palliative care has been following for goals of care.  Remains DNR.  Current recommendation is to continue full scope of  treatment.  Debility/deconditioning: We requested for PT/OT evaluation.  Patient lives with her family at home.recommended hH  Morbid obesity: BMI 37.2  Nutrition Problem:  Inadequate oral intake Etiology: dysphagia, decreased appetite      DVT prophylaxis:Heparin [place] Code Status: DNR Family Communication: Called and discussed with sister on phone on 12/07/20 Status is: Inpatient  Remains inpatient appropriate because:Inpatient level of care appropriate due to severity of illness  Dispo: The patient is from: Home              Anticipated d/c is to: Home              Patient currently is not medically stable to d/c.   Difficult to place patient No      Consultants: PCCM, oncology, ENT, neurosurgery  Procedures: Intubation  Antimicrobials:  Anti-infectives (From admission, onward)    Start     Dose/Rate Route Frequency Ordered Stop   12/09/20 1515  Ampicillin-Sulbactam (UNASYN) 3 g in sodium chloride 0.9 % 100 mL IVPB        3 g 200 mL/hr over 30 Minutes Intravenous Every 6 hours 12/09/20 1426     12/07/20 1200  vancomycin (VANCOREADY) IVPB 1500 mg/300 mL  Status:  Discontinued        1,500 mg 150 mL/hr over 120 Minutes Intravenous Every 48 hours 12/05/20 1001 12/06/20 1241   12/05/20 1200  vancomycin (VANCOREADY) IVPB 2000 mg/400 mL        2,000 mg 200 mL/hr over 120 Minutes Intravenous  Once 12/05/20 1001 12/05/20 1439   11/29/20 1800  azithromycin (ZITHROMAX) 500 mg in sodium chloride 0.9 % 250 mL IVPB  Status:  Discontinued        500 mg 250 mL/hr over 60 Minutes Intravenous Every 24 hours 11/29/20 0401 11/29/20 0905   11/29/20 0800  cefTRIAXone (ROCEPHIN) 1 g in sodium chloride 0.9 % 100 mL IVPB  Status:  Discontinued        1 g 200 mL/hr over 30 Minutes Intravenous Every 24 hours 11/29/20 0401 11/29/20 0905   11/28/20 2100  vancomycin (VANCOREADY) IVPB 1750 mg/350 mL  Status:  Discontinued        1,750 mg 175 mL/hr over 120 Minutes Intravenous  Once 11/28/20 2024 11/28/20 2029   11/28/20 2100  vancomycin (VANCOREADY) IVPB 1750 mg/350 mL  Status:  Discontinued        1,750 mg 175 mL/hr over 120 Minutes Intravenous Every 48 hours  11/28/20 2029 11/29/20 0349   11/28/20 2045  metroNIDAZOLE (FLAGYL) IVPB 500 mg  Status:  Discontinued        500 mg 100 mL/hr over 60 Minutes Intravenous Every 8 hours 11/28/20 2033 11/29/20 0349   11/28/20 2015  levofloxacin (LEVAQUIN) IVPB 750 mg        750 mg 100 mL/hr over 90 Minutes Intravenous  Once 11/28/20 2012 11/28/20 2204       Subjective:  Patient seen and examined the bedside this morning.  Hemodynamically stable during my evaluation.  She was still complaining of lower abdominal discomfort.  Eager to go home.  On 10 L of oxygen per minute.  Objective: Vitals:   12/09/20 0354 12/09/20 0447 12/09/20 0840 12/09/20 1105  BP: 100/68   (!) 105/57  Pulse: (!) 58   61  Resp: 16   10  Temp: 98.6 F (37 C)   98.5 F (36.9 C)  TempSrc: Oral   Oral  SpO2: 94%  92%   Weight:  105 kg  Height:        Intake/Output Summary (Last 24 hours) at 12/09/2020 1426 Last data filed at 12/09/2020 0451 Gross per 24 hour  Intake 60 ml  Output 1600 ml  Net -1540 ml   Filed Weights   12/07/20 0500 12/08/20 0500 12/09/20 0447  Weight: 104.4 kg 105.2 kg 105 kg    Examination:   General exam: Overall comfortable, not in distress,obese HEENT: Trach Respiratory system:  no wheezes or crackles  Cardiovascular system: S1 & S2 heard, RRR.  Gastrointestinal system: Abdomen is nondistended, soft .PEG.  Lower abdominal tenderness Central nervous system: Alert and oriented Extremities: No edema, no clubbing ,no cyanosis Skin: No rashes, no ulcers,no icterus    Data Reviewed: I have personally reviewed following labs and imaging studies  CBC: Recent Labs  Lab 12/03/20 0349 12/04/20 0253 12/05/20 0337 12/07/20 0436 12/09/20 0424 12/09/20 0635  WBC 7.3 9.3 10.8* 12.5* 12.0*  --   NEUTROABS  --   --   --  10.7* 10.6*  --   HGB 10.1* 10.2* 10.4* 9.8* 6.5* 6.5*  HCT 30.8* 30.9* 31.0* 28.9* 19.4* 19.6*  MCV 98.7 98.4 98.4 100.0 100.0  --   PLT 160 161 149* 122* 110*  --    Basic  Metabolic Panel: Recent Labs  Lab 12/03/20 0349 12/04/20 0253 12/05/20 0337 12/07/20 0436 12/09/20 0125  NA 137 138 138 134* 135  K 3.7 3.6 4.0 3.9 4.1  CL 101 100 100 93* 94*  CO2 _0 GLUCOSE 141* 113* 120* 107* 130*  BUN 45* 47* 50* 55* 60*  CREATININE 1.94* 1.93* 1.92* 1.79* 1.92*  CALCIUM 8.5* 8.6* 8.5* 9.0 8.7*   GFR: Estimated Creatinine Clearance: 38.2 mL/min (A) (by C-G formula based on SCr of 1.92 mg/dL (H)). Liver Function Tests: Recent Labs  Lab 12/03/20 0349 12/04/20 0253 12/05/20 0337  AST 42* 51* 34  ALT 26 36 30  ALKPHOS 31* 32* 30*  BILITOT 0.8 0.9 0.8  PROT 5.6* 5.9* 5.7*  ALBUMIN 2.6* 2.8* 2.7*   No results for input(s): LIPASE, AMYLASE in the last 168 hours. No results for input(s): AMMONIA in the last 168 hours. Coagulation Profile: No results for input(s): INR, PROTIME in the last 168 hours. Cardiac Enzymes: No results for input(s): CKTOTAL, CKMB, CKMBINDEX, TROPONINI in the last 168 hours. BNP (last 3 results) No results for input(s): PROBNP in the last 8760 hours. HbA1C: No results for input(s): HGBA1C in the last 72 hours. CBG: Recent Labs  Lab 12/08/20 2018 12/08/20 2323 12/09/20 0419 12/09/20 0818 12/09/20 1132  GLUCAP 125* 136* 110* 89 96   Lipid Profile: No results for input(s): CHOL, HDL, LDLCALC, TRIG, CHOLHDL, LDLDIRECT in the last 72 hours. Thyroid Function Tests: No results for input(s): TSH, T4TOTAL, FREET4, T3FREE, THYROIDAB in the last 72 hours. Anemia Panel: No results for input(s): VITAMINB12, FOLATE, FERRITIN, TIBC, IRON, RETICCTPCT in the last 72 hours. Sepsis Labs: Recent Labs  Lab 12/05/20 0921 12/05/20 1244 12/06/20 0655 12/07/20 0436  PROCALCITON <0.10  --  <0.10 <0.10  LATICACIDVEN 1.4 1.5  --   --     Recent Results (from the past 240 hour(s))  Culture, blood (routine x 2)     Status: None (Preliminary result)   Collection Time: 12/05/20  9:21 AM   Specimen: BLOOD RIGHT ARM  Result Value  Ref Range Status   Specimen Description BLOOD RIGHT ARM  Final   Special Requests   Final    BOTTLES DRAWN  AEROBIC ONLY Blood Culture adequate volume   Culture   Final    NO GROWTH 4 DAYS Performed at [facility] [address] [place], [place] [address]    Report Status PENDING  Incomplete  Culture, blood (routine x 2)     Status: None (Preliminary result)   Collection Time: 12/05/20  9:35 AM   Specimen: BLOOD RIGHT HAND  Result Value Ref Range Status   Specimen Description BLOOD RIGHT HAND  Final   Special Requests   Final    BOTTLES DRAWN AEROBIC AND ANAEROBIC Blood Culture results may not be optimal due to an inadequate volume of blood received in culture bottles   Culture   Final    NO GROWTH 4 DAYS Performed at [facility], [place] [address], [place], [place] [address]    Report Status PENDING  Incomplete         Radiology Studies: CT ABDOMEN PELVIS WO CONTRAST  Result Date: 12/09/2020 CLINICAL DATA:  Abdominal pain, rule out hemorrhage, dropping hemoglobin EXAM: CT ABDOMEN AND PELVIS WITHOUT CONTRAST TECHNIQUE: Multidetector CT imaging of the abdomen and pelvis was performed following the standard protocol without IV contrast. COMPARISON:  04/24/2020 FINDINGS: Lower chest: Extensive dependent bibasilar atelectasis or consolidation with air bronchograms. Hepatobiliary: No focal liver abnormality is seen. Status post cholecystectomy. No biliary dilatation. Pancreas: Unremarkable. No pancreatic ductal dilatation or surrounding inflammatory changes. Spleen: Normal in size without significant abnormality. Adrenals/Urinary Tract: Adrenal glands are unremarkable. Fluid attenuation cysts of the left kidney. No hydronephrosis. Bladder is unremarkable. Stomach/Bowel: Percutaneous gastrostomy tube. Appendix appears normal. No evidence of bowel wall thickening, distention, or inflammatory changes. Descending and sigmoid diverticulosis. Vascular/Lymphatic: No significant vascular  findings are present. No enlarged abdominal or pelvic lymph nodes. Reproductive: No mass or other significant abnormality. Other: There is a large right rectus sheath hematoma measuring 13.7 x 9.4 x 9.2 cm (series 3, image 64, series 7, image 113). Midline diastasis and prior ventral hernia mesh repair. Anasarca. Small volume free fluid in the low pelvis. Musculoskeletal: No acute or significant osseous findings. IMPRESSION: 1. There is a large right rectus sheath hematoma measuring 13.7 x 9.4 x 9.2 cm. 2. Extensive dependent bibasilar atelectasis or consolidation with air bronchograms, appearance concerning for infection or aspiration. 3. Small volume free fluid in the low pelvis. These results will be called to the ordering clinician or representative by the Radiologist Assistant, and communication documented in the PACS or [employer]. Electronically Signed   By: Eddie Candle M.D.   On: 12/09/2020 13:43        Scheduled Meds:  arformoterol  15 mcg Nebulization BID   budesonide  0.5 mg Nebulization BID   chlorhexidine gluconate (MEDLINE KIT)  15 mL Mouth Rinse BID   Chlorhexidine Gluconate Cloth  6 each Topical Daily   dexamethasone  4 mg Per Tube Q8H   diclofenac Sodium  2 g Topical QID   escitalopram  5 mg Per Tube Daily   feeding supplement (OSMOLITE 1.5 CAL)  237 mL Per Tube TID   free water  150 mL Per Tube Q4H   furosemide  20 mg Intravenous Q12H   insulin aspart  0-9 Units Subcutaneous Q4H   levETIRAcetam  750 mg Per Tube BID   lidocaine  1 patch Transdermal Q24H   lidocaine  1 patch Transdermal Q24H   mouth rinse  15 mL Mouth Rinse 10 times per day   midodrine  5 mg Per Tube BID WC  multivitamin  15 mL Per Tube QHS   pantoprazole sodium  40 mg Per Tube Daily   scopolamine  1 patch Transdermal Q72H   senna-docusate  1 tablet Per Tube BID   tamsulosin  0.4 mg Oral Daily   Continuous Infusions:  sodium chloride     ampicillin-sulbactam (UNASYN) IV       LOS: 11 days     Time spent: 35 mins.More than 50% of that time was spent in counseling and/or coordination of care.      Shelly Coss, MD [facility] P8/03/2021, 2:26 PM

## 2020-12-09 NOTE — Progress Notes (Signed)
Paged on call MD in [facility] to report critical hgb result of 6.5.  Waiting for f/u.  No signs of distress.  Sleeping at this time.  No active bleeding noted.  HR does drop into the upper 40s at time but then bounces back up into the 60s.  At times SBP in the 90s as well, which has been the case since admission.  No resp distress.

## 2020-12-09 NOTE — Plan of Care (Signed)
  Problem: Safety: Goal: Non-violent Restraint(s) Outcome: Progressing   Problem: Education: Goal: Knowledge of General Education information will improve Description: Including pain rating scale, medication(s)/side effects and non-pharmacologic comfort measures Outcome: Progressing   Problem: Health Behavior/Discharge Planning: Goal: Ability to manage health-related needs will improve Outcome: Progressing   Problem: Clinical Measurements: Goal: Ability to maintain clinical measurements within normal limits will improve Outcome: Progressing Goal: Respiratory complications will improve Outcome: Progressing Goal: Cardiovascular complication will be avoided Outcome: Progressing   Problem: Activity: Goal: Risk for activity intolerance will decrease Outcome: Progressing   Problem: Coping: Goal: Level of anxiety will decrease Outcome: Progressing   Problem: Elimination: Goal: Will not experience complications related to bowel motility Outcome: Progressing Goal: Will not experience complications related to urinary retention Outcome: Progressing   Problem: Pain Managment: Goal: General experience of comfort will improve Outcome: Progressing   Problem: Safety: Goal: Ability to remain free from injury will improve Outcome: Progressing   Problem: Skin Integrity: Goal: Risk for impaired skin integrity will decrease Outcome: Progressing

## 2020-12-10 DIAGNOSIS — C329 Malignant neoplasm of larynx, unspecified: Secondary | ICD-10-CM | POA: Diagnosis not present

## 2020-12-10 LAB — COMPREHENSIVE METABOLIC PANEL
ALT: 17 U/L (ref 0–44)
AST: 26 U/L (ref 15–41)
Albumin: 2.8 g/dL — ABNORMAL LOW (ref 3.5–5.0)
Alkaline Phosphatase: 43 U/L (ref 38–126)
Anion gap: 11 (ref 5–15)
BUN: 58 mg/dL — ABNORMAL HIGH (ref 6–20)
CO2: 32 mmol/L (ref 22–32)
Calcium: 8.9 mg/dL (ref 8.9–10.3)
Chloride: 93 mmol/L — ABNORMAL LOW (ref 98–111)
Creatinine, Ser: 2.1 mg/dL — ABNORMAL HIGH (ref 0.44–1.00)
GFR, Estimated: 26 mL/min — ABNORMAL LOW (ref >60)
Glucose, Bld: 116 mg/dL — ABNORMAL HIGH (ref 70–99)
Potassium: 4.5 mmol/L (ref 3.5–5.1)
Sodium: 136 mmol/L (ref 135–145)
Total Bilirubin: 0.6 mg/dL (ref 0.3–1.2)
Total Protein: 6.2 g/dL — ABNORMAL LOW (ref 6.5–8.1)

## 2020-12-10 LAB — GLUCOSE, CAPILLARY
Glucose-Capillary: 110 mg/dL — ABNORMAL HIGH (ref 70–99)
Glucose-Capillary: 122 mg/dL — ABNORMAL HIGH (ref 70–99)
Glucose-Capillary: 130 mg/dL — ABNORMAL HIGH (ref 70–99)
Glucose-Capillary: 144 mg/dL — ABNORMAL HIGH (ref 70–99)
Glucose-Capillary: 153 mg/dL — ABNORMAL HIGH (ref 70–99)
Glucose-Capillary: 85 mg/dL (ref 70–99)

## 2020-12-10 LAB — CBC WITH DIFFERENTIAL/PLATELET
Abs Immature Granulocytes: 0.13 10*3/uL — ABNORMAL HIGH (ref 0.00–0.07)
Basophils Absolute: 0 10*3/uL (ref 0.0–0.1)
Basophils Relative: 0 %
Eosinophils Absolute: 0 10*3/uL (ref 0.0–0.5)
Eosinophils Relative: 0 %
HCT: 19.6 % — ABNORMAL LOW (ref 36.0–46.0)
Hemoglobin: 6.6 g/dL — CL (ref 12.0–15.0)
Immature Granulocytes: 1 %
Lymphocytes Relative: 3 %
Lymphs Abs: 0.5 10*3/uL — ABNORMAL LOW (ref 0.7–4.0)
MCH: 33.7 pg (ref 26.0–34.0)
MCHC: 33.7 g/dL (ref 30.0–36.0)
MCV: 100 fL (ref 80.0–100.0)
Monocytes Absolute: 0.8 10*3/uL (ref 0.1–1.0)
Monocytes Relative: 6 %
Neutro Abs: 13.1 10*3/uL — ABNORMAL HIGH (ref 1.7–7.7)
Neutrophils Relative %: 90 %
Platelets: 121 10*3/uL — ABNORMAL LOW (ref 150–400)
RBC: 1.96 MIL/uL — ABNORMAL LOW (ref 3.87–5.11)
RDW: 17.5 % — ABNORMAL HIGH (ref 11.5–15.5)
WBC: 14.6 10*3/uL — ABNORMAL HIGH (ref 4.0–10.5)
nRBC: 0 % (ref 0.0–0.2)

## 2020-12-10 LAB — CULTURE, BLOOD (ROUTINE X 2)
Culture: NO GROWTH
Culture: NO GROWTH
Special Requests: ADEQUATE

## 2020-12-10 LAB — PREPARE RBC (CROSSMATCH)

## 2020-12-10 LAB — SODIUM, URINE, RANDOM: Sodium, Ur: 88 mmol/L

## 2020-12-10 MED ORDER — SODIUM CHLORIDE 0.9 % IV SOLN: INTRAVENOUS | Status: DC

## 2020-12-10 MED ORDER — SODIUM CHLORIDE 0.9% IV SOLUTION: Freq: Once | INTRAVENOUS | Status: AC

## 2020-12-10 MED ORDER — DIPHENHYDRAMINE HCL 25 MG PO CAPS
25.0000 mg | ORAL_CAPSULE | Freq: Three times a day (TID) | ORAL | Status: DC | PRN
  Administered 2020-12-10: 25 mg via ORAL
  Filled 2020-12-10: qty 1

## 2020-12-10 MED ORDER — MIDODRINE HCL 5 MG PO TABS
5.0000 mg | ORAL_TABLET | Freq: Three times a day (TID) | ORAL | Status: DC
  Administered 2020-12-10 – 2020-12-13 (×9): 5 mg
  Filled 2020-12-10 (×9): qty 1

## 2020-12-10 NOTE — Progress Notes (Signed)
OT Cancellation Note  Patient Details Name: Veronica Horton MRN: [id number] DOB: 1961-04-14   Cancelled Treatment:    Reason Eval/Treat Not Completed: Medical issues which prohibited therapy. Spoke with pt's nurse and she is on her 2nd bag of blood transfusion and RN would like her to finish 2nd bag before proceeding with therapy due to pt's BP is low even at rest. Will follow up with patient next week for OT treatment session.  Veronica Horton, OTR/L Acute Rehab Services Pager [phone] Office [phone]    Almon Register 12/10/2020, 2:09 PM

## 2020-12-10 NOTE — Progress Notes (Signed)
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 Neurosurgery was following for possible resection but has recommended outpatient follow up.ENT was also following,now recommending outpatient follow up at [facility].Palliative care  were also following for goals of care discussion.  PT/OT consulted and recommended home health.  Hospital course remarkable for persistent requirement of high amount of oxygen, she is on 5 L at baseline.  She was also complaining of abdominal wall pain.  CT abdomen/pelvis done today showed rectus sheath hematoma.  Imaging also suspicious for aspiration pneumonia.  Started on antibiotic.  Assessment & Plan:   Active Problems:   Laryngeal cancer (HCC)   Status epilepticus ([place])   Seizure ([place])   Acute on chronic hypoxic respiratory failure: Chronically on 5 L of oxygen at home through tracheostomy collar.  Currently requiring 8 to 10 L of oxygen per minute.  Chest abdomen/pelvis showed possible aspiration pneumonia on the lower lungs.  Started on Unasyn.  We will continue to try to wean the oxygen to her baseline.    Rectus sheath hematoma/acute blood loss anemia: Patient was complaining of lower abdominal pain since last 1 to 2 days.  Hemoglobin noted to be dropped to 6.5 today.  Abdominal CT/pelvis showed large right rectus sheath hematoma measuring 13.7 x9.4 x 9.2 cm. Hemoglobin is still in the range of 6 today.  Transfused with a total of 3 units during this hospitalization.  Started on IV  Dilaudid, oral oxycodone.  Meningioma/enhancing mass with surrounding edema: Currently on Decadron, Keppra.  Neurosurgery reevaluated her on 12/07/2020 and recommended outpatient follow-up.  As per them, no need for intervention emergently.  Tonic-clonic seizures: Most likely secondary to meningioma.  Currently on Keppra.  EEG did not show any specific seizure pathology.  No seizure episodes recently.  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  PEG tube dislodged and was replaced on 8/2.  ENT was following.  ENT  had recommended referral to head/ neck surgery in [place]/[facility]  for discussion of salvage laryngectomy, bilateral neck dissection.   Patient will also follow up with ENT,Dr. Isaias Cowman as an outpatient. Her oncologist Dr. Chryl Heck mentioned that she is not a candidate for aggressive treatment, there is no plan for initiating chemotherapy for now.  I also discussed with Dr. Isidore Moos, radiation oncology, recommend outpatient follow-up saying there is no plan for emergent radiation treatment On discharge, patient should be provided with adequate suction instrument, adequate tracheostomy tubes. I have requested Dr. Isaias Cowman to put orders to make her trach to cuffless so that RT can doi  AKI on CKD stage IIIb: Kidney function worsened, most likely secondary to overdiuresis from Lasix.  Her baseline creatinine is around 1.5.  Started on IV fluids.  Urinary retention: Foley inserted on 7/3.  Now removed. started on Flomax  Chronic hypotension: On midodrine  History of DVT: She was on Eliquis previously, not on Eliquis currently.  We will continue to hold because of rectus sheath hematoma.  Goals of care: Poor quality of life, multiple comorbidities, including trach/PEG.  Palliative care  has been following for goals of care.  Remains DNR.  Current recommendation is to continue full scope of treatment.  Debility/deconditioning: We requested for PT/OT evaluation.   Patient lives with her family at home.recommended hH  Morbid obesity: BMI 37.2  Nutrition Problem: Inadequate oral intake Etiology: dysphagia, decreased appetite      DVT prophylaxis:Heparin [place] Code Status: DNR Family Communication: Called and discussed with sister on phone on 12/10/20 Status is: Inpatient  Remains inpatient appropriate because:Inpatient level of care appropriate due to severity of illness  Dispo: The patient is from: Home              Anticipated d/c is to: Home              Patient currently is not medically stable to d/c.   Difficult to place patient No      Consultants: PCCM, oncology, ENT, neurosurgery  Procedures: Intubation  Antimicrobials:  Anti-infectives (From admission, onward)    Start     Dose/Rate Route Frequency Ordered Stop   12/09/20 1515  Ampicillin-Sulbactam (UNASYN) 3 g in sodium chloride 0.9 % 100 mL IVPB        3 g 200 mL/hr over 30 Minutes Intravenous Every 6 hours 12/09/20 1426     12/07/20 1200  vancomycin (VANCOREADY) IVPB 1500 mg/300 mL  Status:  Discontinued        1,500 mg 150 mL/hr over 120 Minutes Intravenous Every 48 hours 12/05/20 1001 12/06/20 1241   12/05/20 1200  vancomycin (VANCOREADY) IVPB 2000 mg/400 mL        2,000 mg 200 mL/hr over 120 Minutes Intravenous  Once 12/05/20 1001 12/05/20 1439   11/29/20 1800  azithromycin (ZITHROMAX) 500 mg in sodium chloride 0.9 % 250 mL IVPB  Status:  Discontinued        500 mg 250 mL/hr over 60 Minutes Intravenous Every 24 hours 11/29/20 0401 11/29/20 0905   11/29/20 0800  cefTRIAXone (ROCEPHIN) 1 g in sodium chloride 0.9 % 100 mL IVPB  Status:  Discontinued        1 g 200 mL/hr over 30 Minutes Intravenous Every 24 hours 11/29/20 0401 11/29/20 0905   11/28/20 2100  vancomycin (VANCOREADY) IVPB 1750 mg/350 mL  Status:  Discontinued        1,750 mg 175 mL/hr over 120 Minutes Intravenous  Once 11/28/20 2024 11/28/20 2029   11/28/20 2100  vancomycin (VANCOREADY) IVPB 1750 mg/350  mL  Status:  Discontinued        1,750 mg 175 mL/hr over 120 Minutes Intravenous Every 48 hours 11/28/20 2029 11/29/20 0349   11/28/20 2045  metroNIDAZOLE (FLAGYL) IVPB 500 mg  Status:  Discontinued        500 mg 100 mL/hr over 60 Minutes Intravenous Every 8 hours 11/28/20 2033 11/29/20 0349   11/28/20 2015  levofloxacin (LEVAQUIN) IVPB 750 mg        750 mg 100 mL/hr over 90 Minutes Intravenous  Once 11/28/20 2012 11/28/20 2204       Subjective:  Patient seen and examined the bedside this morning.  She was not in any kind of distress.  On 8 L of oxygen per minute.  Abdominal wall pain better controlled today.  Objective: Vitals:   12/10/20 1143 12/10/20 1317 12/10/20 1341 12/10/20 1347  BP: (!) 88/48     Pulse: (!) 54  (!) 55   Resp: _0 Temp: 99.2 F (37.3 C) 99.1 F (37.3 C)  98.6 F (37  C)  TempSrc: Oral Oral  Oral  SpO2:  92% 93%   Weight:      Height:        Intake/Output Summary (Last 24 hours) at 12/10/2020 1404 Last data filed at 12/10/2020 1330 Gross per 24 hour  Intake 700 ml  Output 2700 ml  Net -2000 ml   Filed Weights   12/08/20 0500 12/09/20 0447 12/10/20 0500  Weight: 105.2 kg 105 kg 104.2 kg    Examination:   General exam: obese, not in distress HEENT: Trach Respiratory system: Diminished air sounds on bases ,no wheezes or crackles  Cardiovascular system: S1 & S2 heard, RRR.  Gastrointestinal system: Abdomen is nondistended, soft .  Tenderness on the lower abdomen.  PEG Central nervous system: Alert and oriented Extremities: No edema, no clubbing ,no cyanosis Skin: No rashes, no ulcers,no icterus    Data Reviewed: I have personally reviewed following labs and imaging studies  CBC: Recent Labs  Lab 12/04/20 0253 12/05/20 0337 12/07/20 0436 12/09/20 0424 12/09/20 0635 12/10/20 0507  WBC 9.3 10.8* 12.5* 12.0*  --  14.6*  NEUTROABS  --   --  10.7* 10.6*  --  13.1*  HGB 10.2* 10.4* 9.8* 6.5* 6.5* 6.6*  HCT 30.9* 31.0* 28.9* 19.4*  19.6* 19.6*  MCV 98.4 98.4 100.0 100.0  --  100.0  PLT 161 149* 122* 110*  --  709*   Basic Metabolic Panel: Recent Labs  Lab 12/04/20 0253 12/05/20 0337 12/07/20 0436 12/09/20 0125 12/10/20 0507  NA 138 138 134* 135 136  K 3.6 4.0 3.9 4.1 4.5  CL 100 100 93* 94* 93*  CO2 _0 32  GLUCOSE 113* 120* 107* 130* 116*  BUN 47* 50* 55* 60* 58*  CREATININE 1.93* 1.92* 1.79* 1.92* 2.10*  CALCIUM 8.6* 8.5* 9.0 8.7* 8.9   GFR: Estimated Creatinine Clearance: 34.8 mL/min (A) (by C-G formula based on SCr of 2.1 mg/dL (H)). Liver Function Tests: Recent Labs  Lab 12/04/20 0253 12/05/20 0337 12/10/20 0507  AST 51* 34 26  ALT 36 30 17  ALKPHOS 32* 30* 43  BILITOT 0.9 0.8 0.6  PROT 5.9* 5.7* 6.2*  ALBUMIN 2.8* 2.7* 2.8*   No results for input(s): LIPASE, AMYLASE in the last 168 hours. No results for input(s): AMMONIA in the last 168 hours. Coagulation Profile: No results for input(s): INR, PROTIME in the last 168 hours. Cardiac Enzymes: No results for input(s): CKTOTAL, CKMB, CKMBINDEX, TROPONINI in the last 168 hours. BNP (last 3 results) No results for input(s): PROBNP in the last 8760 hours. HbA1C: No results for input(s): HGBA1C in the last 72 hours. CBG: Recent Labs  Lab 12/09/20 2105 12/09/20 2342 12/10/20 0319 12/10/20 0812 12/10/20 1141  GLUCAP 112* 190* 122* 85 144*   Lipid Profile: No results for input(s): CHOL, HDL, LDLCALC, TRIG, CHOLHDL, LDLDIRECT in the last 72 hours. Thyroid Function Tests: No results for input(s): TSH, T4TOTAL, FREET4, T3FREE, THYROIDAB in the last 72 hours. Anemia Panel: No results for input(s): VITAMINB12, FOLATE, FERRITIN, TIBC, IRON, RETICCTPCT in the last 72 hours. Sepsis Labs: Recent Labs  Lab 12/05/20 0921 12/05/20 1244 12/06/20 0655 12/07/20 0436  PROCALCITON <0.10  --  <0.10 <0.10  LATICACIDVEN 1.4 1.5  --   --     Recent Results (from the past 240 hour(s))  Culture, blood (routine x 2)     Status: None  (Preliminary result)   Collection Time: 12/05/20  9:21 AM   Specimen: BLOOD RIGHT ARM  Result  Value Ref Range Status   Specimen Description BLOOD RIGHT ARM  Final   Special Requests   Final    BOTTLES DRAWN AEROBIC ONLY Blood Culture adequate volume   Culture   Final    NO GROWTH 4 DAYS Performed at [facility], [address], [place], [place] [address]    Report Status PENDING  Incomplete  Culture, blood (routine x 2)     Status: None (Preliminary result)   Collection Time: 12/05/20  9:35 AM   Specimen: BLOOD RIGHT HAND  Result Value Ref Range Status   Specimen Description BLOOD RIGHT HAND  Final   Special Requests   Final    BOTTLES DRAWN AEROBIC AND ANAEROBIC Blood Culture results may not be optimal due to an inadequate volume of blood received in culture bottles   Culture   Final    NO GROWTH 4 DAYS Performed at [facility], [place] [address] [place],  [address]    Report Status PENDING  Incomplete         Radiology Studies: CT ABDOMEN PELVIS WO CONTRAST  Result Date: 12/09/2020 CLINICAL DATA:  Abdominal pain, rule out hemorrhage, dropping hemoglobin EXAM: CT ABDOMEN AND PELVIS WITHOUT CONTRAST TECHNIQUE: Multidetector CT imaging of the abdomen and pelvis was performed following the standard protocol without IV contrast. COMPARISON:  04/24/2020 FINDINGS: Lower chest: Extensive dependent bibasilar atelectasis or consolidation with air bronchograms. Hepatobiliary: No focal liver abnormality is seen. Status post cholecystectomy. No biliary dilatation. Pancreas: Unremarkable. No pancreatic ductal dilatation or surrounding inflammatory changes. Spleen: Normal in size without significant abnormality. Adrenals/Urinary Tract: Adrenal glands are unremarkable. Fluid attenuation cysts of the left kidney. No hydronephrosis. Bladder is unremarkable. Stomach/Bowel: Percutaneous gastrostomy tube. Appendix appears normal. No evidence of bowel wall thickening, distention, or  inflammatory changes. Descending and sigmoid diverticulosis. Vascular/Lymphatic: No significant vascular findings are present. No enlarged abdominal or pelvic lymph nodes. Reproductive: No mass or other significant abnormality. Other: There is a large right rectus sheath hematoma measuring 13.7 x 9.4 x 9.2 cm (series 3, image 64, series 7, image 113). Midline diastasis and prior ventral hernia mesh repair. Anasarca. Small volume free fluid in the low pelvis. Musculoskeletal: No acute or significant osseous findings. IMPRESSION: 1. There is a large right rectus sheath hematoma measuring 13.7 x 9.4 x 9.2 cm. 2. Extensive dependent bibasilar atelectasis or consolidation with air bronchograms, appearance concerning for infection or aspiration. 3. Small volume free fluid in the low pelvis. These results will be called to the ordering clinician or representative by the Radiologist Assistant, and communication documented in the PACS or [employer]. Electronically Signed   By: Eddie Candle M.D.   On: 12/09/2020 13:43   DG CHEST PORT 1 VIEW  Result Date: 12/09/2020 CLINICAL DATA:  Dyspnea. EXAM: PORTABLE CHEST 1 VIEW COMPARISON:  December 05, 2020. FINDINGS: Stable cardiomediastinal silhouette. Tracheostomy tube is unchanged in position. No pneumothorax is noted. Stable bibasilar subsegmental atelectasis or edema is noted with small pleural effusions. Bony thorax is unremarkable. IMPRESSION: Stable bibasilar subsegmental atelectasis or edema is noted with small pleural effusions. Electronically Signed   By: Marijo Conception M.D.   On: 12/09/2020 15:51        Scheduled Meds:  arformoterol  15 mcg Nebulization BID   budesonide  0.5 mg Nebulization BID   chlorhexidine gluconate (MEDLINE KIT)  15 mL Mouth Rinse BID   Chlorhexidine Gluconate Cloth  6 each Topical Daily   dexamethasone  4 mg Per Tube  Q8H   diclofenac Sodium  2 g Topical QID   escitalopram  5 mg Per Tube Daily   feeding supplement (OSMOLITE 1.5  CAL)  237 mL Per Tube TID   free water  150 mL Per Tube Q4H   insulin aspart  0-9 Units Subcutaneous Q4H   levETIRAcetam  750 mg Per Tube BID   lidocaine  1 patch Transdermal Q24H   mouth rinse  15 mL Mouth Rinse 10 times per day   midodrine  5 mg Per Tube BID WC   multivitamin  15 mL Per Tube QHS   pantoprazole sodium  40 mg Per Tube Daily   scopolamine  1 patch Transdermal Q72H   senna-docusate  1 tablet Per Tube BID   tamsulosin  0.4 mg Oral Daily   Continuous Infusions:  sodium chloride     sodium chloride 100 mL/hr at 12/10/20 1249   ampicillin-sulbactam (UNASYN) IV 3 g (12/10/20 0959)     LOS: 12 days    Time spent: 35 mins.More than 50% of that time was spent in counseling and/or coordination of care.      Shelly Coss, MD [facility] P8/03/2021, 2:04 PM

## 2020-12-10 NOTE — Progress Notes (Signed)
PT Cancellation Note  Patient Details Name: Veronica Horton MRN: [id number] DOB: 1960/07/18   Cancelled Treatment:    Reason Eval/Treat Not Completed: Medical issues which prohibited therapy Per OT who spoke with pt's nurse, patient is on her 2nd bag of blood transfusion and RN would like her to finish 2nd bag before proceeding with therapy due to pt's BP is low even at rest. PT will re-attempt as time allows.   Monita Swier A. Gilford Rile PT, DPT Acute Rehabilitation Services Pager [phone] Office [phone]    Linna Hoff 12/10/2020, 2:13 PM

## 2020-12-10 NOTE — Progress Notes (Addendum)
[facility] consulted to obtain 2nd PIV while blood transfusing. SecureChat sent to Tawanna Solo, MD and Levada Dy, unit RN regarding need for PIV for fluids only. Educated best practice is to allow for vein preservation and decreased infection risk by limiting IV sticks whenever possible Dr. Tawanna Solo stated patient appears dehydrated and could use fluids if transfusion is not almost finished. Advised that VAST will assess patient's vasculature and attempt PIV placement if nurse responds that transfusion will last more than an hour. Spoke with patient's nurse, Levada Dy who reported patient needs 2 units of blood. Advised VAST RN will be there shortly to assess patient's vasculature and place PIV if possible.

## 2020-12-10 NOTE — Progress Notes (Signed)
  Speech Language Pathology Treatment: Dysphagia  Patient Details Name: Veronica Horton MRN: [id number] DOB: Nov 01, 1960 Today's Date: 12/10/2020 Time: 1205-1217 SLP Time Calculation (min) (ACUTE ONLY): 12 min  Assessment / Plan / Recommendation Clinical Impression  Pt eating lunch upon arrival with PMV in place.  Lurline Idol has been changed to uncuffed. She was feeding herself without difficulty; tolerating dysphagia 3 with honey thick liquids with adequate toleration. She had difficulty remembering the instructions reviewed with her earlier in the week re: circumstances when she could potentially have thin water (Between meals and after oral care), stating inaccurately that she could drink thin water while eating a meal.  We reviewed instructions re: water protocol.  Ms. Counts with inconsistent understanding.  Continue education re: swallowing and PMV pending D/C with home health.    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 Of note, she was admitted with aspiration PNA 03/2020 and CT Head at that time showed extensive vasogenic edema R parietal/occipital region with concern for meningioma.  Patient refused an MRI at that time and was to follow with neurosurgery outpatient; she states that she did not follow up for insurance reasons.  CT head shows redemonstrated mass in the R parietal lobe with extensive vasogenic edema throughout the R parietal lobe. 8/1 vent via chronic trach; 8/2 transitioned to ATC.  Agitated and self-removed PEG 8/2. Completed radiation and chemo 05/31/20. Last PET 6/22 with decreased size of supraglottic mass.  Pt has been followed by SLP during prior admissions, undergoing MBS and PMV evaluations.   Last MBS was11/27/21-  dys3/thin liquids was recommended. Recent MBS 8/3 with slightly deteriorated swallow and trace, silent aspiration of thin and nectar thick liquids.  Dys 1/honey thick recommended pending [place] conversations.      SLP Plan  Continue with current plan of care       Recommendations  Diet recommendations: Dysphagia 3 (mechanical soft);Honey-thick liquid Liquids provided via: Cup;Straw Medication Administration: Via alternative means Compensations: Small sips/bites Postural Changes and/or Swallow Maneuvers: Seated upright 90 degrees      PMSV Supervision: Intermittent         Oral Care Recommendations: Oral care prior to ice chip/H20 SLP Visit Diagnosis: Dysphagia, pharyngeal phase (R13.13) Plan: Continue with current plan of care       GO              Mora Pedraza L. Tivis Ringer, [place] Office number [phone] Pager [phone]   Veronica Horton 12/10/2020, 1:32 PM

## 2020-12-10 NOTE — Progress Notes (Signed)
Desatted during sleep and was noted to have passey muir valve still in.  Sat dropped to 84 but as soon as it was removed, her sat immediately went back up to 94%.  No signs of distress.  No SOB or complaints.  Awakened and reminded that she needs to ensure that she needs to remove valve when she sleeps.  Said she was on the phone and fell asleep.

## 2020-12-11 DIAGNOSIS — C329 Malignant neoplasm of larynx, unspecified: Secondary | ICD-10-CM | POA: Diagnosis not present

## 2020-12-11 LAB — BASIC METABOLIC PANEL
Anion gap: 9 (ref 5–15)
BUN: 57 mg/dL — ABNORMAL HIGH (ref 6–20)
CO2: 33 mmol/L — ABNORMAL HIGH (ref 22–32)
Calcium: 8.5 mg/dL — ABNORMAL LOW (ref 8.9–10.3)
Chloride: 94 mmol/L — ABNORMAL LOW (ref 98–111)
Creatinine, Ser: 2.07 mg/dL — ABNORMAL HIGH (ref 0.44–1.00)
GFR, Estimated: 27 mL/min — ABNORMAL LOW (ref >60)
Glucose, Bld: 110 mg/dL — ABNORMAL HIGH (ref 70–99)
Potassium: 4.4 mmol/L (ref 3.5–5.1)
Sodium: 136 mmol/L (ref 135–145)

## 2020-12-11 LAB — GLUCOSE, CAPILLARY
Glucose-Capillary: 109 mg/dL — ABNORMAL HIGH (ref 70–99)
Glucose-Capillary: 96 mg/dL (ref 70–99)
Glucose-Capillary: 133 mg/dL — ABNORMAL HIGH (ref 70–99)
Glucose-Capillary: 103 mg/dL — ABNORMAL HIGH (ref 70–99)
Glucose-Capillary: 124 mg/dL — ABNORMAL HIGH (ref 70–99)
Glucose-Capillary: 197 mg/dL — ABNORMAL HIGH (ref 70–99)

## 2020-12-11 LAB — CBC WITH DIFFERENTIAL/PLATELET
Abs Immature Granulocytes: 0.11 10*3/uL — ABNORMAL HIGH (ref 0.00–0.07)
Basophils Absolute: 0 10*3/uL (ref 0.0–0.1)
Basophils Relative: 0 %
Eosinophils Absolute: 0 10*3/uL (ref 0.0–0.5)
Eosinophils Relative: 0 %
HCT: 22.6 % — ABNORMAL LOW (ref 36.0–46.0)
Hemoglobin: 7.6 g/dL — ABNORMAL LOW (ref 12.0–15.0)
Immature Granulocytes: 1 %
Lymphocytes Relative: 3 %
Lymphs Abs: 0.4 10*3/uL — ABNORMAL LOW (ref 0.7–4.0)
MCH: 32.6 pg (ref 26.0–34.0)
MCHC: 33.6 g/dL (ref 30.0–36.0)
MCV: 97 fL (ref 80.0–100.0)
Monocytes Absolute: 0.6 10*3/uL (ref 0.1–1.0)
Monocytes Relative: 4 %
Neutro Abs: 12.3 10*3/uL — ABNORMAL HIGH (ref 1.7–7.7)
Neutrophils Relative %: 92 %
Platelets: 117 10*3/uL — ABNORMAL LOW (ref 150–400)
RBC: 2.33 MIL/uL — ABNORMAL LOW (ref 3.87–5.11)
RDW: 18.2 % — ABNORMAL HIGH (ref 11.5–15.5)
WBC: 13.4 10*3/uL — ABNORMAL HIGH (ref 4.0–10.5)
nRBC: 0 % (ref 0.0–0.2)

## 2020-12-11 LAB — BPAM RBC
Blood Product Expiration Date: 202209072359
Blood Product Expiration Date: 202209072359
ISSUE DATE / TIME: 202208121036
ISSUE DATE / TIME: 202208121307
Unit Type and Rh: 6200
Unit Type and Rh: 6200

## 2020-12-11 LAB — TYPE AND SCREEN
ABO/RH(D): A POS
Antibody Screen: NEGATIVE
Unit division: 0
Unit division: 0

## 2020-12-11 MED ORDER — DIPHENHYDRAMINE HCL 25 MG PO CAPS: 25.0000 mg | ORAL_CAPSULE | Freq: Three times a day (TID) | ORAL | Status: DC | PRN

## 2020-12-11 NOTE — Plan of Care (Signed)
  Problem: Education: Goal: Knowledge of General Education information will improve Description: Including pain rating scale, medication(s)/side effects and non-pharmacologic comfort measures Outcome: Progressing   Problem: Health Behavior/Discharge Planning: Goal: Ability to manage health-related needs will improve Outcome: Progressing   Problem: Clinical Measurements: Goal: Respiratory complications will improve Outcome: Progressing Goal: Cardiovascular complication will be avoided Outcome: Progressing   Problem: Activity: Goal: Risk for activity intolerance will decrease Outcome: Progressing   Problem: Coping: Goal: Level of anxiety will decrease Outcome: Progressing   Problem: Elimination: Goal: Will not experience complications related to bowel motility Outcome: Progressing

## 2020-12-11 NOTE — Progress Notes (Signed)
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 Neurosurgery was following for possible resection but has recommended outpatient follow up.ENT was also following,now recommending outpatient follow up at [facility].Palliative care  were also following for goals of care discussion.  PT/OT consulted and recommended home health.  Hospital course remarkable for persistent requirement of high amount of oxygen, she is on 5 L at baseline.  She was also complaining of abdominal wall pain.  CT abdomen/pelvis done here showed rectus sheath hematoma.  Imaging also suspicious for aspiration pneumonia.  She had acute drop in her hemoglobin requiring 3 units of blood transfusion.  Assessment & Plan:   Active Problems:   Laryngeal cancer (HCC)   Status epilepticus ([place])   Seizure ([place])   Acute on chronic hypoxic respiratory failure: Chronically on 5 L of oxygen at home through tracheostomy collar.  Currently requiring 8 to 10 L of oxygen per minute.  Chest abdomen/pelvis showed possible aspiration pneumonia on the lower lungs.  Started on Unasyn.  We will continue to try to wean the oxygen to her baseline.    Rectus sheath hematoma/acute blood loss anemia: Patient was complaining of lower abdominal pain since last 1 to 2 days.  Hemoglobin noted to be dropped to 6.5 .  Abdominal CT/pelvis showed large right rectus sheath hematoma measuring 13.7 x9.4 x 9.2 cm. Transfused with a total of 3 units during this hospitalization. Hb stable in the  range of 7 today. Continue pain management.  Meningioma/enhancing mass with surrounding edema: Currently on Decadron, Keppra.  Neurosurgery reevaluated her on 12/07/2020 and recommended outpatient follow-up.  As per them, no need for intervention emergently.  Tonic-clonic seizures: Most likely secondary to meningioma.  Currently on Keppra.  EEG did not show any specific seizure pathology.  No seizure episodes recently.  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  PEG tube dislodged and was replaced on 8/2.  ENT was following.  ENT  had recommended referral to head/ neck surgery in [place]  for discussion of salvage laryngectomy, bilateral neck dissection.   Patient will also follow up with ENT,Dr. Isaias Cowman as an outpatient. Her oncologist Dr. Chryl Heck mentioned that she is not a candidate for aggressive treatment, there is no plan for initiating chemotherapy for now.  I also discussed with Dr. Isidore Moos, radiation oncology, recommend outpatient follow-up saying there is no plan for emergent radiation treatment On discharge, patient should be provided with adequate suction instrument, adequate tracheostomy tubes. I have requested Dr. Isaias Cowman to put orders to make her trach to cuffless so that RT can doi  AKI on CKD stage IIIb: Kidney function worsened, most likely secondary to overdiuresis from Lasix.  Her baseline creatinine is around 1.5.  Started on IV fluids,now stopped.  Urinary retention: Foley inserted on 7/3.  Now removed. started on Flomax  Chronic hypotension: On midodrine  History of DVT: She was on Eliquis previously, not on Eliquis currently.  We will continue to hold because of rectus sheath hematoma.  Goals of care: Poor quality of life, multiple comorbidities,  including trach/PEG.  Palliative care has been following for goals of care.  Remains DNR.  Current recommendation is to continue full scope of treatment.  Debility/deconditioning: We  requested for PT/OT evaluation.  Patient lives with her family at home.recommended hH  Morbid obesity: BMI 37.2  Nutrition Problem: Inadequate oral intake Etiology: dysphagia, decreased appetite      DVT prophylaxis:Heparin [place] Code Status: DNR Family Communication: Called and discussed with sister on phone on 12/10/20 Status is: Inpatient  Remains inpatient appropriate because:Inpatient level of care appropriate due to severity of illness  Dispo: The patient is from: Home              Anticipated d/c is to: Home              Patient currently is not medically stable to d/c.   Difficult to place patient No      Consultants: PCCM, oncology, ENT, neurosurgery  Procedures: Intubation  Antimicrobials:  Anti-infectives (From admission, onward)    Start     Dose/Rate Route Frequency Ordered Stop   12/09/20 1515  Ampicillin-Sulbactam (UNASYN) 3 g in sodium chloride 0.9 % 100 mL IVPB        3 g 200 mL/hr over 30 Minutes Intravenous Every 6 hours 12/09/20 1426     12/07/20 1200  vancomycin (VANCOREADY) IVPB 1500 mg/300 mL  Status:  Discontinued        1,500 mg 150 mL/hr over 120 Minutes Intravenous Every 48 hours 12/05/20 1001 12/06/20 1241   12/05/20 1200  vancomycin (VANCOREADY) IVPB 2000 mg/400 mL        2,000 mg 200 mL/hr over 120 Minutes Intravenous  Once 12/05/20 1001 12/05/20 1439   11/29/20 1800  azithromycin (ZITHROMAX) 500 mg in sodium chloride 0.9 % 250 mL IVPB  Status:  Discontinued        500 mg 250 mL/hr over 60 Minutes Intravenous Every 24 hours 11/29/20 0401 11/29/20 0905   11/29/20 0800  cefTRIAXone (ROCEPHIN) 1 g in sodium chloride 0.9 % 100 mL IVPB  Status:  Discontinued        1 g 200 mL/hr over 30 Minutes Intravenous Every 24 hours 11/29/20 0401 11/29/20 0905   11/28/20 2100  vancomycin (VANCOREADY) IVPB 1750 mg/350 mL  Status:  Discontinued        1,750 mg 175 mL/hr over 120 Minutes Intravenous  Once 11/28/20 2024 11/28/20 2029   11/28/20 2100   vancomycin (VANCOREADY) IVPB 1750 mg/350 mL  Status:  Discontinued        1,750 mg 175 mL/hr over 120 Minutes Intravenous Every 48 hours 11/28/20 2029 11/29/20 0349   11/28/20 2045  metroNIDAZOLE (FLAGYL) IVPB 500 mg  Status:  Discontinued        500 mg 100 mL/hr over 60 Minutes Intravenous Every 8 hours 11/28/20 2033 11/29/20 0349   11/28/20 2015  levofloxacin (LEVAQUIN) IVPB 750 mg        750 mg 100 mL/hr over 90 Minutes Intravenous  Once 11/28/20 2012 11/28/20 2204       Subjective:  Patient seen and examined the bedside this morning.  Looks comfortable today.  Still on 8 L of oxygen per minute.  Abdominal pain well controlled  Objective: Vitals:   12/11/20 0418 12/11/20 0432 12/11/20 0440 12/11/20 0800  BP: (!) 96/52 (!) 100/58  (!) 96/54  Pulse: (!) 51 81 81 (!) 54  Resp: $Remo'15 14 14   'MHeMD$ Temp:    98.5 F (36.9 C)  TempSrc:  Oral  SpO2: 92% 93% 90%   Weight:      Height:        Intake/Output Summary (Last 24 hours) at 12/11/2020 0830 Last data filed at 12/11/2020 5619 Gross per 24 hour  Intake 2826.32 ml  Output 1100 ml  Net 1726.32 ml   Filed Weights   12/08/20 0500 12/09/20 0447 12/10/20 0500  Weight: 105.2 kg 105 kg 104.2 kg    Examination:   General exam: Overall comfortable, not in distress, chronically ill looking, deconditioned HEENT: Trach  respiratory system: Bilateral basal crackles  cardiovascular system: S1 & S2 heard, RRR.  Gastrointestinal system: Abdomen is nondistended, soft and nontender.PEG Central nervous system: Alert and oriented Extremities: No edema, no clubbing ,no cyanosis Skin: No rashes, no ulcers,no icterus    Data Reviewed: I have personally reviewed following labs and imaging studies  CBC: Recent Labs  Lab 12/05/20 0337 12/07/20 0436 12/09/20 0424 12/09/20 0635 12/10/20 0507 12/11/20 0353  WBC 10.8* 12.5* 12.0*  --  14.6* 13.4*  NEUTROABS  --  10.7* 10.6*  --  13.1* 12.3*  HGB 10.4* 9.8* 6.5* 6.5* 6.6* 7.6*  HCT 31.0*  28.9* 19.4* 19.6* 19.6* 22.6*  MCV 98.4 100.0 100.0  --  100.0 97.0  PLT 149* 122* 110*  --  121* 117*   Basic Metabolic Panel: Recent Labs  Lab 12/05/20 0337 12/07/20 0436 12/09/20 0125 12/10/20 0507 12/11/20 0353  NA 138 134* 135 136 136  K 4.0 3.9 4.1 4.5 4.4  CL 100 93* 94* 93* 94*  CO2 30 30 31  32 33*  GLUCOSE 120* 107* 130* 116* 110*  BUN 50* 55* 60* 58* 57*  CREATININE 1.92* 1.79* 1.92* 2.10* 2.07*  CALCIUM 8.5* 9.0 8.7* 8.9 8.5*   GFR: Estimated Creatinine Clearance: 35.3 mL/min (A) (by C-G formula based on SCr of 2.07 mg/dL (H)). Liver Function Tests: Recent Labs  Lab 12/05/20 0337 12/10/20 0507  AST 34 26  ALT 30 17  ALKPHOS 30* 43  BILITOT 0.8 0.6  PROT 5.7* 6.2*  ALBUMIN 2.7* 2.8*   No results for input(s): LIPASE, AMYLASE in the last 168 hours. No results for input(s): AMMONIA in the last 168 hours. Coagulation Profile: No results for input(s): INR, PROTIME in the last 168 hours. Cardiac Enzymes: No results for input(s): CKTOTAL, CKMB, CKMBINDEX, TROPONINI in the last 168 hours. BNP (last 3 results) No results for input(s): PROBNP in the last 8760 hours. HbA1C: No results for input(s): HGBA1C in the last 72 hours. CBG: Recent Labs  Lab 12/10/20 1141 12/10/20 1608 12/10/20 2026 12/10/20 2341 12/11/20 0409  GLUCAP 144* 110* 153* 130* 109*   Lipid Profile: No results for input(s): CHOL, HDL, LDLCALC, TRIG, CHOLHDL, LDLDIRECT in the last 72 hours. Thyroid Function Tests: No results for input(s): TSH, T4TOTAL, FREET4, T3FREE, THYROIDAB in the last 72 hours. Anemia Panel: No results for input(s): VITAMINB12, FOLATE, FERRITIN, TIBC, IRON, RETICCTPCT in the last 72 hours. Sepsis Labs: Recent Labs  Lab 12/05/20 0921 12/05/20 1244 12/06/20 0655 12/07/20 0436  PROCALCITON <0.10  --  <0.10 <0.10  LATICACIDVEN 1.4 1.5  --   --     Recent Results (from the past 240 hour(s))  Culture, blood (routine x 2)     Status: None   Collection Time:  12/05/20  9:21 AM   Specimen: BLOOD RIGHT ARM  Result Value Ref Range Status   Specimen Description BLOOD RIGHT ARM  Final   Special Requests   Final    BOTTLES DRAWN  AEROBIC ONLY Blood Culture adequate volume   Culture   Final    NO GROWTH 5 DAYS Performed at [facility], [place] [address] [place], [place] [address]    Report Status 12/10/2020 FINAL  Final  Culture, blood (routine x 2)     Status: None   Collection Time: 12/05/20  9:35 AM   Specimen: BLOOD RIGHT HAND  Result Value Ref Range Status   Specimen Description BLOOD RIGHT HAND  Final   Special Requests   Final    BOTTLES DRAWN AEROBIC AND ANAEROBIC Blood Culture results may not be optimal due to an inadequate volume of blood received in culture bottles   Culture   Final    NO GROWTH 5 DAYS Performed at [facility], [place] [address], [place], [place] [address]    Report Status 12/10/2020 FINAL  Final         Radiology Studies: CT ABDOMEN PELVIS WO CONTRAST  Result Date: 12/09/2020 CLINICAL DATA:  Abdominal pain, rule out hemorrhage, dropping hemoglobin EXAM: CT ABDOMEN AND PELVIS WITHOUT CONTRAST TECHNIQUE: Multidetector CT imaging of the abdomen and pelvis was performed following the standard protocol without IV contrast. COMPARISON:  04/24/2020 FINDINGS: Lower chest: Extensive dependent bibasilar atelectasis or consolidation with air bronchograms. Hepatobiliary: No focal liver abnormality is seen. Status post cholecystectomy. No biliary dilatation. Pancreas: Unremarkable. No pancreatic ductal dilatation or surrounding inflammatory changes. Spleen: Normal in size without significant abnormality. Adrenals/Urinary Tract: Adrenal glands are unremarkable. Fluid attenuation cysts of the left kidney. No hydronephrosis. Bladder is unremarkable. Stomach/Bowel: Percutaneous gastrostomy tube. Appendix appears normal. No evidence of bowel wall thickening, distention, or inflammatory changes. Descending and sigmoid  diverticulosis. Vascular/Lymphatic: No significant vascular findings are present. No enlarged abdominal or pelvic lymph nodes. Reproductive: No mass or other significant abnormality. Other: There is a large right rectus sheath hematoma measuring 13.7 x 9.4 x 9.2 cm (series 3, image 64, series 7, image 113). Midline diastasis and prior ventral hernia mesh repair. Anasarca. Small volume free fluid in the low pelvis. Musculoskeletal: No acute or significant osseous findings. IMPRESSION: 1. There is a large right rectus sheath hematoma measuring 13.7 x 9.4 x 9.2 cm. 2. Extensive dependent bibasilar atelectasis or consolidation with air bronchograms, appearance concerning for infection or aspiration. 3. Small volume free fluid in the low pelvis. These results will be called to the ordering clinician or representative by the Radiologist Assistant, and communication documented in the PACS or [employer]. Electronically Signed   By: Eddie Candle M.D.   On: 12/09/2020 13:43   DG CHEST PORT 1 VIEW  Result Date: 12/09/2020 CLINICAL DATA:  Dyspnea. EXAM: PORTABLE CHEST 1 VIEW COMPARISON:  December 05, 2020. FINDINGS: Stable cardiomediastinal silhouette. Tracheostomy tube is unchanged in position. No pneumothorax is noted. Stable bibasilar subsegmental atelectasis or edema is noted with small pleural effusions. Bony thorax is unremarkable. IMPRESSION: Stable bibasilar subsegmental atelectasis or edema is noted with small pleural effusions. Electronically Signed   By: Marijo Conception M.D.   On: 12/09/2020 15:51        Scheduled Meds:  arformoterol  15 mcg Nebulization BID   budesonide  0.5 mg Nebulization BID   chlorhexidine gluconate (MEDLINE KIT)  15 mL Mouth Rinse BID   Chlorhexidine Gluconate Cloth  6 each Topical Daily   dexamethasone  4 mg Per Tube Q8H   diclofenac Sodium  2 g Topical QID   escitalopram  5 mg Per Tube Daily   feeding supplement (OSMOLITE 1.5  CAL)  237 mL Per Tube TID   free water  150  mL Per Tube Q4H   insulin aspart  0-9 Units Subcutaneous Q4H   levETIRAcetam  750 mg Per Tube BID   lidocaine  1 patch Transdermal Q24H   mouth rinse  15 mL Mouth Rinse 10 times per day   midodrine  5 mg Per Tube TID WC   multivitamin  15 mL Per Tube QHS   pantoprazole sodium  40 mg Per Tube Daily   scopolamine  1 patch Transdermal Q72H   senna-docusate  1 tablet Per Tube BID   tamsulosin  0.4 mg Oral Daily   Continuous Infusions:  sodium chloride     sodium chloride 100 mL/hr at 12/11/20 0411   ampicillin-sulbactam (UNASYN) IV 3 g (12/11/20 0243)     LOS: 13 days    Time spent: 35 mins.More than 50% of that time was spent in counseling and/or coordination of care.      Shelly Coss, MD [facility] P8/13/2022, 8:30 AM

## 2020-12-11 NOTE — Progress Notes (Signed)
Tele called because kept desatting.  Sats were in the mid to upper 80s and was sustaining there.  I assessed her and suctioned her, had her coughing, ensured O2 was flowing but she kept desatting. Pt denied SOB and did not appear to be in resp distress.  I increased the O2, after suctioning her again and not getting any change.  Was about to page RT to assist me when the RT came to the floor and was about to enter the room.  I did tell the RT what I did and she said she would assess her.

## 2020-12-12 ENCOUNTER — Inpatient Hospital Stay (HOSPITAL_COMMUNITY): Payer: Medicaid Other

## 2020-12-12 DIAGNOSIS — C329 Malignant neoplasm of larynx, unspecified: Secondary | ICD-10-CM | POA: Diagnosis not present

## 2020-12-12 LAB — BASIC METABOLIC PANEL
Anion gap: 9 (ref 5–15)
BUN: 54 mg/dL — ABNORMAL HIGH (ref 8–23)
CO2: 30 mmol/L (ref 22–32)
Calcium: 8.7 mg/dL — ABNORMAL LOW (ref 8.9–10.3)
Chloride: 97 mmol/L — ABNORMAL LOW (ref 98–111)
Creatinine, Ser: 2 mg/dL — ABNORMAL HIGH (ref 0.44–1.00)
GFR, Estimated: 28 mL/min — ABNORMAL LOW (ref >60)
Glucose, Bld: 89 mg/dL (ref 70–99)
Potassium: 4.6 mmol/L (ref 3.5–5.1)
Sodium: 136 mmol/L (ref 135–145)

## 2020-12-12 LAB — CBC WITH DIFFERENTIAL/PLATELET
Abs Immature Granulocytes: 0.11 10*3/uL — ABNORMAL HIGH (ref 0.00–0.07)
Basophils Absolute: 0 10*3/uL (ref 0.0–0.1)
Basophils Relative: 0 %
Eosinophils Absolute: 0 10*3/uL (ref 0.0–0.5)
Eosinophils Relative: 0 %
HCT: 27.6 % — ABNORMAL LOW (ref 36.0–46.0)
Hemoglobin: 9.2 g/dL — ABNORMAL LOW (ref 12.0–15.0)
Immature Granulocytes: 1 %
Lymphocytes Relative: 4 %
Lymphs Abs: 0.5 10*3/uL — ABNORMAL LOW (ref 0.7–4.0)
MCH: 32.7 pg (ref 26.0–34.0)
MCHC: 33.3 g/dL (ref 30.0–36.0)
MCV: 98.2 fL (ref 80.0–100.0)
Monocytes Absolute: 0.6 10*3/uL (ref 0.1–1.0)
Monocytes Relative: 5 %
Neutro Abs: 11.5 10*3/uL — ABNORMAL HIGH (ref 1.7–7.7)
Neutrophils Relative %: 90 %
Platelets: 141 10*3/uL — ABNORMAL LOW (ref 150–400)
RBC: 2.81 MIL/uL — ABNORMAL LOW (ref 3.87–5.11)
RDW: 17.7 % — ABNORMAL HIGH (ref 11.5–15.5)
WBC: 12.7 10*3/uL — ABNORMAL HIGH (ref 4.0–10.5)
nRBC: 0 % (ref 0.0–0.2)

## 2020-12-12 LAB — GLUCOSE, CAPILLARY
Glucose-Capillary: 100 mg/dL — ABNORMAL HIGH (ref 70–99)
Glucose-Capillary: 109 mg/dL — ABNORMAL HIGH (ref 70–99)
Glucose-Capillary: 140 mg/dL — ABNORMAL HIGH (ref 70–99)
Glucose-Capillary: 86 mg/dL (ref 70–99)
Glucose-Capillary: 95 mg/dL (ref 70–99)
Glucose-Capillary: 98 mg/dL (ref 70–99)

## 2020-12-12 MED ORDER — POTASSIUM CHLORIDE CRYS ER 20 MEQ PO TBCR
20.0000 meq | EXTENDED_RELEASE_TABLET | Freq: Every day | ORAL | Status: DC
  Administered 2020-12-13: 20 meq via ORAL
  Filled 2020-12-12: qty 1

## 2020-12-12 MED ORDER — FUROSEMIDE 40 MG PO TABS
60.0000 mg | ORAL_TABLET | Freq: Every day | ORAL | Status: DC
  Administered 2020-12-12 – 2020-12-13 (×2): 60 mg via ORAL
  Filled 2020-12-12 (×2): qty 1

## 2020-12-12 MED ORDER — POTASSIUM CHLORIDE CRYS ER 20 MEQ PO TBCR
60.0000 meq | EXTENDED_RELEASE_TABLET | Freq: Every day | ORAL | Status: DC
  Administered 2020-12-12: 60 meq via ORAL
  Filled 2020-12-12: qty 3

## 2020-12-12 MED ORDER — POTASSIUM CHLORIDE CRYS ER 20 MEQ PO TBCR: 40.0000 meq | EXTENDED_RELEASE_TABLET | Freq: Every day | ORAL | Status: DC

## 2020-12-12 NOTE — Progress Notes (Signed)
Pharmacy Antibiotic Note  Veronica Horton is a 60 y.o. female admitted on 11/28/2020 with aspiration pneumonia.  Pharmacy has been consulted for unasyn dosing.  Noted history of rash/hives to PCN that occurred over 10 years ago. Did not receive any PCNs recently per Epic. RN aware and will monitor closely after first dose. ClCr 37 ml/min, will watch trend.  WBC improved to 12.7. Afebrile. Cultures negative. Renal function stable.    Plan: Continue ampicillin/sulbactam 3g Q 6 hr Monitor cultures, clinical status, renal fx Narrow abx as able and f/u duration   Height: 5\' 6"  (167.6 cm) Weight: 107.9 kg (237 lb 14 oz) IBW/kg (Calculated) : 59.3  Temp (24hrs), Avg:98.6 F (37 C), Min:98.2 F (36.8 C), Max:98.9 F (37.2 C)  Recent Labs  Lab 12/05/20 0921 12/05/20 1244 12/07/20 0436 12/09/20 0125 12/09/20 0424 12/10/20 0507 12/11/20 0353 12/12/20 0344  WBC  --   --  12.5*  --  12.0* 14.6* 13.4* 12.7*  CREATININE  --   --  1.79* 1.92*  --  2.10* 2.07* 2.00*  LATICACIDVEN 1.4 1.5  --   --   --   --   --   --      Estimated Creatinine Clearance: 36.7 mL/min (A) (by C-G formula based on SCr of 2 mg/dL (H)).    Allergies  Allergen Reactions   Codeine Hives and Itching    Reports itching only per RN   Penicillins Hives    Did it involve swelling of the face/tongue/throat, SOB, or low BP? No Did it involve sudden or severe rash/hives, skin peeling, or any reaction on the inside of your mouth or nose? Yes Did you need to seek medical attention at a hospital or doctor's office? Unknown When did it last happen?      Over 10 years If all above answers are "NO", may proceed with cephalosporin use.     Antimicrobials this admission: Amp/sulb 8/11 >>   Vanc x 1 8/7   Microbiology results: 8/7 BCx: neg 7/31 Bcx: neg   8/1 MRSA PCR: +  Thank you for allowing pharmacy to be a part of this patient's care.  Kerby Nora, PharmD, BCPS Clinical Pharmacist  Please check AMION for  all [place] phone numbers After 10:00 PM, call [place] [phone]

## 2020-12-12 NOTE — Progress Notes (Signed)
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 Neurosurgery was following for possible resection but has recommended outpatient follow up.ENT was also following,now recommending outpatient follow up at [facility].Palliative care  were also following for goals of care discussion.  PT/OT consulted and recommended home health.  Hospital course remarkable for persistent requirement of high amount of oxygen, she is on 5 L at baseline.  She was also complaining of abdominal wall pain.  CT abdomen/pelvis done here showed rectus sheath hematoma.  Imaging also suspicious for aspiration pneumonia.  She had acute drop in her hemoglobin requiring 3 units of blood transfusion. Now we are waiting for decrement in the oxygen requirement before discharge planning to home.  Assessment & Plan:   Active Problems:   Laryngeal cancer (HCC)   Status epilepticus ([place])   Seizure ([place])   Acute on chronic hypoxic respiratory failure: Chronically on 5 L of oxygen at home through tracheostomy collar.  Currently requiring 8 to 10 L of oxygen per minute.  Chest abdomen/pelvis showed possible aspiration pneumonia on the lower lungs.  Started on Unasyn.  We will continue to try to wean the oxygen to her baseline.    Rectus sheath hematoma/acute blood loss anemia: Patient was complaining of lower abdominal pain since last 1 to 2 days.  Hemoglobin noted to be dropped to 6.5 .  Abdominal CT/pelvis showed large right rectus sheath hematoma measuring 13.7 x9.4 x  9.2 cm. Transfused with a total of 3 units during this hospitalization. Hb stable in the range of 7 today. Continue pain management.  Meningioma/enhancing mass with surrounding edema: Currently on Decadron, Keppra.  Neurosurgery reevaluated her on 12/07/2020 and recommended outpatient follow-up.  As per them, no need for intervention emergently.  Tonic-clonic seizures: Most likely secondary to meningioma.  Currently on Keppra.  EEG did not show any specific seizure pathology.  No seizure episodes recently.  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  PEG tube dislodged and was replaced on 8/2.  ENT was following.  ENT  had recommended referral to head/ neck surgery in [place]  for discussion of salvage laryngectomy, bilateral neck dissection.   Patient will also follow up with ENT,Dr. Isaias Cowman as an outpatient. Her oncologist Dr. Chryl Heck mentioned that she is not a candidate for aggressive treatment, there is no plan for initiating chemotherapy for now.  I also discussed with Dr. Isidore Moos, radiation oncology, recommend outpatient follow-up saying there is no plan for emergent radiation treatment On discharge, patient should be provided with adequate suction instrument, adequate tracheostomy tubes. Her trach has been changed to cuffless  AKI on CKD stage IIIb: Kidney function worsened, most likely secondary to overdiuresis from Lasix.  Her baseline creatinine is around 1.5.  Started on IV fluids,now stopped.Restarted oral lasix  Urinary retention: Foley inserted on 7/3.  Now removed. started on Flomax  Chronic hypotension: On midodrine  History of DVT: She was on Eliquis previously, not on Eliquis currently.  We will continue to hold because of rectus sheath hematoma.  Goals of care: Poor  quality of life, multiple comorbidities, including trach/PEG.  Palliative care has been following for goals of care.  Remains DNR.  Current recommendation is to continue  full scope of treatment.  Debility/deconditioning: We requested for PT/OT evaluation.  Patient lives with her family at home.recommended hH  Sinus bradycardia: Unknown etiology.  Patient is asymptomatic.  We will continue to monitor.  Avoid AV nodal blocking agents  Morbid obesity: BMI 37.2  Nutrition Problem: Inadequate oral intake Etiology: dysphagia, decreased appetite      DVT prophylaxis:Heparin [place] Code Status: DNR Family Communication: Called and discussed with sister on phone on 12/10/20 Status is: Inpatient  Remains inpatient appropriate because:Inpatient level of care appropriate due to severity of illness  Dispo: The patient is from: Home              Anticipated d/c is to: Home              Patient currently is not medically stable to d/c.   Difficult to place patient No  Still on high requirement of oxygen delaying discharge   Consultants: PCCM, oncology, ENT, neurosurgery  Procedures: Intubation  Antimicrobials:  Anti-infectives (From admission, onward)    Start     Dose/Rate Route Frequency Ordered Stop   12/09/20 1515  Ampicillin-Sulbactam (UNASYN) 3 g in sodium chloride 0.9 % 100 mL IVPB        3 g 200 mL/hr over 30 Minutes Intravenous Every 6 hours 12/09/20 1426     12/07/20 1200  vancomycin (VANCOREADY) IVPB 1500 mg/300 mL  Status:  Discontinued        1,500 mg 150 mL/hr over 120 Minutes Intravenous Every 48 hours 12/05/20 1001 12/06/20 1241   12/05/20 1200  vancomycin (VANCOREADY) IVPB 2000 mg/400 mL        2,000 mg 200 mL/hr over 120 Minutes Intravenous  Once 12/05/20 1001 12/05/20 1439   11/29/20 1800  azithromycin (ZITHROMAX) 500 mg in sodium chloride 0.9 % 250 mL IVPB  Status:  Discontinued        500 mg 250 mL/hr over 60 Minutes Intravenous Every 24 hours 11/29/20 0401 11/29/20 0905   11/29/20 0800  cefTRIAXone (ROCEPHIN) 1 g in sodium chloride 0.9 % 100 mL IVPB  Status:  Discontinued        1 g 200 mL/hr over 30 Minutes Intravenous Every 24  hours 11/29/20 0401 11/29/20 0905   11/28/20 2100  vancomycin (VANCOREADY) IVPB 1750 mg/350 mL  Status:  Discontinued        1,750 mg 175 mL/hr over 120 Minutes Intravenous  Once 11/28/20 2024 11/28/20 2029   11/28/20 2100  vancomycin (VANCOREADY) IVPB 1750 mg/350 mL  Status:  Discontinued        1,750 mg 175 mL/hr over 120 Minutes Intravenous Every 48 hours 11/28/20 2029 11/29/20 0349   11/28/20 2045  metroNIDAZOLE (FLAGYL) IVPB 500 mg  Status:  Discontinued        500 mg 100 mL/hr over 60 Minutes Intravenous Every 8 hours 11/28/20 2033 11/29/20 0349   11/28/20 2015  levofloxacin (LEVAQUIN) IVPB 750 mg        750 mg 100 mL/hr over 90 Minutes Intravenous  Once 11/28/20 2012 11/28/20 2204       Subjective:  Patient seen and examined the bedside this morning.  Hemodynamically stable.  Comfortable.  Denies any new complaints today.  Abdominal pain has resolved.  Still on 10 L of oxygen.  Desaturates while weaning the oxygen  Objective: Vitals:   12/11/20  2315 12/12/20 0354 12/12/20 0628 12/12/20 0700  BP:  104/67  (!) 103/56  Pulse: (!) 48 (!) 50  (!) 57  Resp: _0 Temp:  98.7 F (37.1 C)  98.2 F (36.8 C)  TempSrc:  Oral  Oral  SpO2: 93% 100%  96%  Weight:   107.9 kg   Height:        Intake/Output Summary (Last 24 hours) at 12/12/2020 0738 Last data filed at 12/11/2020 1600 Gross per 24 hour  Intake 474 ml  Output 1200 ml  Net -726 ml   Filed Weights   12/09/20 0447 12/10/20 0500 12/12/20 0628  Weight: 105 kg 104.2 kg 107.9 kg    Examination:   General exam: Overall comfortable, not in distress, deconditioned, chronically ill looking HEENT: Trach  respiratory system:  no wheezes or crackles  Cardiovascular system: S1 & S2 heard, RRR.  Gastrointestinal system: Abdomen is nondistended, soft and nontender.PEG Central nervous system: Alert and oriented Extremities: No edema, no clubbing ,no cyanosis Skin: No rashes, no ulcers,no icterus       Data Reviewed:  I have personally reviewed following labs and imaging studies  CBC: Recent Labs  Lab 12/07/20 0436 12/09/20 0424 12/09/20 0635 12/10/20 0507 12/11/20 0353 12/12/20 0344  WBC 12.5* 12.0*  --  14.6* 13.4* 12.7*  NEUTROABS 10.7* 10.6*  --  13.1* 12.3* 11.5*  HGB 9.8* 6.5* 6.5* 6.6* 7.6* 9.2*  HCT 28.9* 19.4* 19.6* 19.6* 22.6* 27.6*  MCV 100.0 100.0  --  100.0 97.0 98.2  PLT 122* 110*  --  121* 117* 790*   Basic Metabolic Panel: Recent Labs  Lab 12/07/20 0436 12/09/20 0125 12/10/20 0507 12/11/20 0353 12/12/20 0344  NA 134* 135 136 136 136  K 3.9 4.1 4.5 4.4 4.6  CL 93* 94* 93* 94* 97*  CO2 30 31 32 33* 30  GLUCOSE 107* 130* 116* 110* 89  BUN 55* 60* 58* 57* 54*  CREATININE 1.79* 1.92* 2.10* 2.07* 2.00*  CALCIUM 9.0 8.7* 8.9 8.5* 8.7*   GFR: Estimated Creatinine Clearance: 36.7 mL/min (A) (by C-G formula based on SCr of 2 mg/dL (H)). Liver Function Tests: Recent Labs  Lab 12/10/20 0507  AST 26  ALT 17  ALKPHOS 43  BILITOT 0.6  PROT 6.2*  ALBUMIN 2.8*   No results for input(s): LIPASE, AMYLASE in the last 168 hours. No results for input(s): AMMONIA in the last 168 hours. Coagulation Profile: No results for input(s): INR, PROTIME in the last 168 hours. Cardiac Enzymes: No results for input(s): CKTOTAL, CKMB, CKMBINDEX, TROPONINI in the last 168 hours. BNP (last 3 results) No results for input(s): PROBNP in the last 8760 hours. HbA1C: No results for input(s): HGBA1C in the last 72 hours. CBG: Recent Labs  Lab 12/11/20 1224 12/11/20 1745 12/11/20 2041 12/11/20 2321 12/12/20 0335  GLUCAP 133* 103* 124* 197* 98   Lipid Profile: No results for input(s): CHOL, HDL, LDLCALC, TRIG, CHOLHDL, LDLDIRECT in the last 72 hours. Thyroid Function Tests: No results for input(s): TSH, T4TOTAL, FREET4, T3FREE, THYROIDAB in the last 72 hours. Anemia Panel: No results for input(s): VITAMINB12, FOLATE, FERRITIN, TIBC, IRON, RETICCTPCT in the last 72 hours. Sepsis  Labs: Recent Labs  Lab 12/05/20 0921 12/05/20 1244 12/06/20 0655 12/07/20 0436  PROCALCITON <0.10  --  <0.10 <0.10  LATICACIDVEN 1.4 1.5  --   --     Recent Results (from the past 240 hour(s))  Culture, blood (routine x 2)     Status:  None   Collection Time: 12/05/20  9:21 AM   Specimen: BLOOD RIGHT ARM  Result Value Ref Range Status   Specimen Description BLOOD RIGHT ARM  Final   Special Requests   Final    BOTTLES DRAWN AEROBIC ONLY Blood Culture adequate volume   Culture   Final    NO GROWTH 5 DAYS Performed at [facility], [address], [place], [place] [address]    Report Status 12/10/2020 FINAL  Final  Culture, blood (routine x 2)     Status: None   Collection Time: 12/05/20  9:35 AM   Specimen: BLOOD RIGHT HAND  Result Value Ref Range Status   Specimen Description BLOOD RIGHT HAND  Final   Special Requests   Final    BOTTLES DRAWN AEROBIC AND ANAEROBIC Blood Culture results may not be optimal due to an inadequate volume of blood received in culture bottles   Culture   Final    NO GROWTH 5 DAYS Performed at [facility], [place] [address], [place], [place] [address]    Report Status 12/10/2020 FINAL  Final         Radiology Studies: No results found.      Scheduled Meds:  arformoterol  15 mcg Nebulization BID   budesonide  0.5 mg Nebulization BID   chlorhexidine gluconate (MEDLINE KIT)  15 mL Mouth Rinse BID   Chlorhexidine Gluconate Cloth  6 each Topical Daily   dexamethasone  4 mg Per Tube Q8H   diclofenac Sodium  2 g Topical QID   escitalopram  5 mg Per Tube Daily   feeding supplement (OSMOLITE 1.5 CAL)  237 mL Per Tube TID   free water  150 mL Per Tube Q4H   insulin aspart  0-9 Units Subcutaneous Q4H   levETIRAcetam  750 mg Per Tube BID   lidocaine  1 patch Transdermal Q24H   mouth rinse  15 mL Mouth Rinse 10 times per day   midodrine  5 mg Per Tube TID WC   multivitamin  15 mL Per Tube QHS   pantoprazole sodium  40 mg Per Tube  Daily   potassium chloride  60 mEq Oral Daily   scopolamine  1 patch Transdermal Q72H   senna-docusate  1 tablet Per Tube BID   tamsulosin  0.4 mg Oral Daily   Continuous Infusions:  sodium chloride     ampicillin-sulbactam (UNASYN) IV 3 g (12/12/20 0400)     LOS: 14 days    Time spent: 35 mins.More than 50% of that time was spent in counseling and/or coordination of care.      Shelly Coss, MD [facility] P8/14/2022, 7:38 AM

## 2020-12-12 NOTE — Progress Notes (Signed)
Tele has called a couple times because her HR is sustaining in the low 40s and she is asleep.  Also, her sats were sustaining in the mid 80s.  O2 had to be increased to bring her sats back up to the low to mid 90s.  She is asleep and denies any distress. Sent Dr Tawanna Solo a message letting him know her status.

## 2020-12-12 NOTE — Plan of Care (Signed)

## 2020-12-12 NOTE — Progress Notes (Signed)
Dr Tawanna Solo at bedside to evaluate pt.  Said she has been brady since admission and he is ok with HR being in the mid 5s, as that has been her norm since admission and before.  She is not in distress and said she feels good, just tired of always being awakened because her HR drops.  She was informed that we have to keep checking her when this occurs.  No signs of distress.

## 2020-12-12 NOTE — Progress Notes (Signed)
Attempted to educate on PEG feeding per self and flushing and checking site but did not want to participate.  Declined trach education as well and said her family will be caring for her feeding tube and her trach.  I did say that we then need to get them in here to educate them so they can take her home and care for her, as well as educate her and she declined.  Veronica Horton they already have been educated and know.  I did tell her they would need to do a return demonstration before she leaves to go home tomorrow and she said they will.  No distress.  Is not very motivated to learn the skills to care for self at home.

## 2020-12-13 ENCOUNTER — Ambulatory Visit (HOSPITAL_COMMUNITY): Payer: Medicaid Other

## 2020-12-13 ENCOUNTER — Other Ambulatory Visit (HOSPITAL_COMMUNITY): Payer: Self-pay

## 2020-12-13 DIAGNOSIS — C329 Malignant neoplasm of larynx, unspecified: Secondary | ICD-10-CM | POA: Diagnosis not present

## 2020-12-13 DIAGNOSIS — G40901 Epilepsy, unspecified, not intractable, with status epilepticus: Secondary | ICD-10-CM | POA: Diagnosis not present

## 2020-12-13 LAB — CBC WITH DIFFERENTIAL/PLATELET
Abs Immature Granulocytes: 0.11 10*3/uL — ABNORMAL HIGH (ref 0.00–0.07)
Basophils Absolute: 0 10*3/uL (ref 0.0–0.1)
Basophils Relative: 0 %
Eosinophils Absolute: 0 10*3/uL (ref 0.0–0.5)
Eosinophils Relative: 0 %
HCT: 28.4 % — ABNORMAL LOW (ref 36.0–46.0)
Hemoglobin: 9.4 g/dL — ABNORMAL LOW (ref 12.0–15.0)
Immature Granulocytes: 1 %
Lymphocytes Relative: 4 %
Lymphs Abs: 0.5 10*3/uL — ABNORMAL LOW (ref 0.7–4.0)
MCH: 32.5 pg (ref 26.0–34.0)
MCHC: 33.1 g/dL (ref 30.0–36.0)
MCV: 98.3 fL (ref 80.0–100.0)
Monocytes Absolute: 0.7 10*3/uL (ref 0.1–1.0)
Monocytes Relative: 5 %
Neutro Abs: 11.7 10*3/uL — ABNORMAL HIGH (ref 1.7–7.7)
Neutrophils Relative %: 90 %
Platelets: 160 10*3/uL (ref 150–400)
RBC: 2.89 MIL/uL — ABNORMAL LOW (ref 3.87–5.11)
RDW: 17.6 % — ABNORMAL HIGH (ref 11.5–15.5)
WBC: 13 10*3/uL — ABNORMAL HIGH (ref 4.0–10.5)
nRBC: 0 % (ref 0.0–0.2)

## 2020-12-13 LAB — BASIC METABOLIC PANEL
Anion gap: 9 (ref 5–15)
BUN: 51 mg/dL — ABNORMAL HIGH (ref 8–23)
CO2: 31 mmol/L (ref 22–32)
Calcium: 8.9 mg/dL (ref 8.9–10.3)
Chloride: 96 mmol/L — ABNORMAL LOW (ref 98–111)
Creatinine, Ser: 2.17 mg/dL — ABNORMAL HIGH (ref 0.44–1.00)
GFR, Estimated: 25 mL/min — ABNORMAL LOW (ref >60)
Glucose, Bld: 124 mg/dL — ABNORMAL HIGH (ref 70–99)
Potassium: 4.6 mmol/L (ref 3.5–5.1)
Sodium: 136 mmol/L (ref 135–145)

## 2020-12-13 LAB — GLUCOSE, CAPILLARY
Glucose-Capillary: 106 mg/dL — ABNORMAL HIGH (ref 70–99)
Glucose-Capillary: 96 mg/dL (ref 70–99)
Glucose-Capillary: 82 mg/dL (ref 70–99)
Glucose-Capillary: 87 mg/dL (ref 70–99)

## 2020-12-13 MED ORDER — LEVETIRACETAM 100 MG/ML PO SOLN: 750.0000 mg | Freq: Two times a day (BID) | ORAL | 0 refills | Status: DC

## 2020-12-13 MED ORDER — ESCITALOPRAM OXALATE 5 MG PO TABS
5.0000 mg | ORAL_TABLET | Freq: Every day | ORAL | 2 refills | Status: DC
  Filled 2020-12-13: qty 30, 30d supply, fill #0

## 2020-12-13 MED ORDER — OSMOLITE 1.5 CAL PO LIQD: 237.0000 mL | Freq: Three times a day (TID) | ORAL | 0 refills | Status: DC

## 2020-12-13 MED ORDER — FUROSEMIDE 40 MG PO TABS: 40.0000 mg | ORAL_TABLET | Freq: Every day | ORAL | Status: DC

## 2020-12-13 MED ORDER — POLYETHYLENE GLYCOL 3350 17 G PO PACK: 17.0000 g | PACK | Freq: Every day | ORAL | 0 refills | Status: DC | PRN

## 2020-12-13 MED ORDER — ONDANSETRON HCL 8 MG PO TABS
8.0000 mg | ORAL_TABLET | Freq: Three times a day (TID) | ORAL | 0 refills | Status: DC | PRN
  Filled 2020-12-13: qty 20, 7d supply, fill #0

## 2020-12-13 MED ORDER — VITAMIN B-12 1000 MCG PO TABS
500.0000 ug | ORAL_TABLET | Freq: Every day | ORAL | 3 refills | Status: DC
  Filled 2020-12-13: qty 30, 60d supply, fill #0

## 2020-12-13 MED ORDER — GABAPENTIN 300 MG PO CAPS: 600.0000 mg | ORAL_CAPSULE | Freq: Every day | ORAL | Status: DC

## 2020-12-13 MED ORDER — FAMOTIDINE 20 MG PO TABS
20.0000 mg | ORAL_TABLET | Freq: Every day | ORAL | 0 refills | Status: DC
  Filled 2020-12-13: qty 30, 30d supply, fill #0

## 2020-12-13 MED ORDER — OSMOLITE 1.5 CAL PO LIQD
237.0000 mL | Freq: Three times a day (TID) | ORAL | 0 refills | Status: DC
  Filled 2020-12-13: qty 5000, 7d supply, fill #0

## 2020-12-13 MED ORDER — OXYCODONE HCL 5 MG PO TABS
5.0000 mg | ORAL_TABLET | Freq: Four times a day (QID) | ORAL | 0 refills | Status: DC | PRN
  Filled 2020-12-13: qty 20, 5d supply, fill #0

## 2020-12-13 MED ORDER — POLYETHYLENE GLYCOL 3350 17 GM/SCOOP PO POWD
17.0000 g | Freq: Every day | ORAL | 0 refills | Status: DC | PRN
  Filled 2020-12-13: qty 510, 30d supply, fill #0

## 2020-12-13 MED ORDER — MIDODRINE HCL 5 MG PO TABS
5.0000 mg | ORAL_TABLET | Freq: Two times a day (BID) | ORAL | 2 refills | Status: DC
  Filled 2020-12-13: qty 60, 30d supply, fill #0

## 2020-12-13 MED ORDER — LEVETIRACETAM 100 MG/ML PO SOLN
750.0000 mg | Freq: Two times a day (BID) | ORAL | 0 refills | Status: DC
  Filled 2020-12-13: qty 473, 32d supply, fill #0

## 2020-12-13 MED ORDER — TAMSULOSIN HCL 0.4 MG PO CAPS: 0.4000 mg | ORAL_CAPSULE | Freq: Every day | ORAL | 0 refills | Status: DC

## 2020-12-13 MED ORDER — DEXAMETHASONE 4 MG PO TABS
4.0000 mg | ORAL_TABLET | Freq: Two times a day (BID) | ORAL | 0 refills | Status: DC
  Filled 2020-12-13: qty 60, 30d supply, fill #0

## 2020-12-13 MED ORDER — FOLIC ACID 1 MG PO TABS
1.0000 mg | ORAL_TABLET | Freq: Every day | ORAL | 2 refills | Status: DC
  Filled 2020-12-13: qty 30, 30d supply, fill #0

## 2020-12-13 MED ORDER — DEXAMETHASONE 4 MG PO TABS: 4.0000 mg | ORAL_TABLET | Freq: Two times a day (BID) | ORAL | 0 refills | Status: DC

## 2020-12-13 MED ORDER — TAMSULOSIN HCL 0.4 MG PO CAPS
0.4000 mg | ORAL_CAPSULE | Freq: Every day | ORAL | 0 refills | Status: DC
  Filled 2020-12-13: qty 30, 30d supply, fill #0

## 2020-12-13 MED ORDER — APIXABAN 5 MG PO TABS: 5.0000 mg | ORAL_TABLET | Freq: Two times a day (BID) | ORAL | 1 refills | Status: DC

## 2020-12-13 MED ORDER — PROCHLORPERAZINE MALEATE 10 MG PO TABS
10.0000 mg | ORAL_TABLET | Freq: Four times a day (QID) | ORAL | 0 refills | Status: DC | PRN
  Filled 2020-12-13: qty 30, 8d supply, fill #0

## 2020-12-13 NOTE — Progress Notes (Signed)
Physical Therapy Treatment Patient Details Name: Veronica Horton MRN: [id number] DOB: 1961/03/17 Today's Date: 12/13/2020    History of Present Illness Veronica Horton is a 60 y.o. 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 QQV:ZDGLOVFI right convexity meningioma measuring 3.1 x 3.9 x 2.6 cm  with moderate vasogenic edema within the posterior right hemisphere  and 3 mm of leftward midline shift--currently not a surgical candidate due to kidney function.    PT Comments    Pt received up in chair and excited about prospect of going home today. Pt performed seated and standing there ex as well as multiple sit<>stand for strengthening and SPT 2x to [facility] for toileting. Pt performed stepping fwd and back and pregait exercises. PT will continue to follow.    Follow Up Recommendations  Home health PT;Supervision/Assistance - 24 hour (If family unable to provide current level of assist, SNF is recommended.)     Equipment Recommendations  Wheelchair (measurements PT);Wheelchair cushion (measurements PT)    Recommendations for Other Services       Precautions / Restrictions Precautions Precautions: Fall Precaution Comments: trach, PEG Restrictions Weight Bearing Restrictions: No    Mobility  Bed Mobility               General bed mobility comments: pt received in chair    Transfers Overall transfer level: Needs assistance Equipment used: Rolling walker (2 wheeled) Transfers: Sit to/from [facility] Sit to Stand: Min guard Stand pivot transfers: Min guard       General transfer comment: performed sit<>stand multiple times with use of RW, min-guard each time. Min guard for safety with SPT 2x with RW  Ambulation/Gait             General  Gait Details: limited by trach line but pt performed 2' stepping fwd and bkwd with RW as well as marching in place   Stairs             Wheelchair Mobility    Modified Rankin (Stroke Patients Only)       Balance Overall balance assessment: Needs assistance Sitting-balance support: No upper extremity supported;Feet supported Sitting balance-Leahy Scale: Good Sitting balance - Comments: pt able to wt shift in sitting to scoot back   Standing balance support: Single extremity supported Standing balance-Leahy Scale: Poor Standing balance comment: reliant on external support                            Cognition Arousal/Alertness: Awake/alert Behavior During Therapy: WFL for tasks assessed/performed Overall Cognitive Status: Within Functional Limits for tasks assessed                                        Exercises General Exercises - Lower Extremity Ankle Circles/Pumps: AROM;Both;20 reps;Seated Hip ABduction/ADduction: AROM;Both;5 reps;Standing Hip Flexion/Marching: AROM;Both;10 reps;Seated;Standing    General Comments General comments (skin integrity, edema, etc.): VSS      Pertinent Vitals/Pain Pain Assessment: No/denies pain    Home Living                      Prior Function  PT Goals (current goals can now be found in the care plan section) Acute Rehab PT Goals Patient Stated Goal: home PT Goal Formulation: With patient Time For Goal Achievement: 12/21/20 Potential to Achieve Goals: Fair Progress towards PT goals: Progressing toward goals    Frequency    Min 3X/week      PT Plan Current plan remains appropriate    Co-evaluation              AM-PAC PT "6 Clicks" Mobility   Outcome Measure  Help needed turning from your back to your side while in a flat bed without using bedrails?: A Little Help needed moving from lying on your back to sitting on the side of a flat bed without using  bedrails?: A Little Help needed moving to and from a bed to a chair (including a wheelchair)?: A Little Help needed standing up from a chair using your arms (e.g., wheelchair or bedside chair)?: A Little Help needed to walk in hospital room?: A Lot Help needed climbing 3-5 steps with a railing? : Total 6 Click Score: 15    End of Session Equipment Utilized During Treatment: Gait belt;Oxygen Activity Tolerance: Patient tolerated treatment well Patient left: with call bell/phone within reach;in chair;with chair alarm set Nurse Communication: Mobility status (+2 pivot transfer vs stedy) PT Visit Diagnosis: Other abnormalities of gait and mobility (R26.89);Muscle weakness (generalized) (M62.81)     Time: 4540-9811 PT Time Calculation (min) (ACUTE ONLY): 34 min  Charges:  $Gait Training: 8-22 mins $Therapeutic Exercise: 8-22 mins                     Leighton Roach, PT  Acute Rehab Services  Pager [phone] Office [place] 12/13/2020, 1:06 PM

## 2020-12-13 NOTE — Progress Notes (Signed)
Discharge instructions (including medications) discussed with and copy provided to patient/caregiver 

## 2020-12-13 NOTE — Discharge Summary (Addendum)
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 Neurosurgery was following for possible resection but has recommended outpatient follow up.ENT was also following,now recommending outpatient follow up at [facility].Palliative care  were also following for goals of care discussion.  PT/OT consulted and recommended home health.  Hospital course remarkable for persistent requirement of high amount of oxygen, she is on 5 L at baseline.  She was also complaining of abdominal wall pain.  CT abdomen/pelvis done here showed rectus sheath hematoma.  Imaging also suspicious for aspiration pneumonia.  She had acute drop in her hemoglobin requiring 3 units of blood transfusion.  Currently she is hemodynamically stable, on 8 L of oxygen.  She is medically stable for discharge to home today with home health.  Due to her significant comorbidities, she has high risk of readmission in the near future  Following problems were addressed during hospitalization:  Acute on chronic hypoxic respiratory failure: Chronically on 5 L of oxygen at home through tracheostomy collar.  Currently requiring 8  L of oxygen per minute.  Chest abdomen/pelvis showed possible aspiration pneumonia on the lower lungs.  Completed 5 days course of  Unasyn.     Rectus sheath hematoma/acute blood loss anemia: Patient  was complaining of lower abdominal pain since last 1 to 2 days.  Hemoglobin noted to be dropped to 6.5 .  Abdominal CT/pelvis showed large right rectus sheath hematoma measuring 13.7 x9.4 x 9.2 cm. Transfused with a total of 3 units during this hospitalization. Hb stable in the range of 9 today.    Meningioma/enhancing mass with surrounding edema: Currently on Decadron, Keppra.  Neurosurgery reevaluated her on 12/07/2020 and recommended outpatient follow-up.  As per them, no need for intervention emergently.  She will follow-up with Dr. Reatha Armour in 3 months   Tonic-clonic seizures: Most likely secondary to meningioma.  Currently on Keppra.  EEG did not show any specific seizure pathology.  No seizure episodes recently.   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  PEG tube dislodged and was replaced on 8/2.  ENT was following.  ENT  had recommended referral to head/ neck surgery in [place]  for discussion of salvage laryngectomy, bilateral neck dissection.   Patient will also follow up with ENT,Dr. Isaias Cowman as an outpatient. Her oncologist Dr. Chryl Heck mentioned that she is not a candidate for aggressive treatment, there is no plan for initiating chemotherapy for now.  I also discussed with Dr. Isidore Moos, radiation oncology, recommend outpatient follow-up saying there is no plan for emergent radiation treatment On discharge, patient should be provided with adequate suction instrument, adequate tracheostomy tubes. Her trach has been changed to cuffless   AKI on CKD stage IIIb: Kidney function worsened, most likely secondary to overdiuresis from Lasix.  Her baseline creatinine is around 1.5.  Started on IV fluids,now stopped.Restarted oral lasix   Urinary retention: Foley inserted on 7/3.  Now removed. started on Flomax  Chronic hypotension: On midodrine   History of DVT: She was on Eliquis previously, not on Eliquis currently.  We will continue to hold  till next  week because of rectus sheath hematoma.   Goals of care: Poor quality of life, multiple comorbidities, including trach/PEG.  Palliative care has been following for goals of care.  Remains DNR.  Current recommendation is to continue full scope of treatment.   Debility/deconditioning: We requested for PT/OT evaluation.  Patient lives with her family at home.recommended HH   Sinus bradycardia: Unknown etiology.  Patient is asymptomatic.  Avoid AV nodal blocking agents   Morbid obesity: BMI 37.2     Discharge Diagnoses:  Active Problems:   Laryngeal cancer ([place])   Status epilepticus ([place])   Seizure [facility])    Discharge Instructions  Discharge Instructions     Diet - low sodium heart healthy   Complete by: As directed    Dysphagia 3   Discharge instructions   Complete by: As directed    1)Please follow up with your PCP in a week. Do a CBC and BMP tests during the follow up 2)Follow up with [facility] at [facility] as soon as possible.Follow up with your local ENT physician 3)Follow up with oncology and radiation oncology 4)Follow up with neurosurgery as an outpatient in 3 months. Name and number of the provider has been attached   Increase activity slowly   Complete by: As directed       Allergies as of 12/13/2020       Reactions   Codeine Hives, Itching   Reports itching only per RN   Penicillins Hives   Did it involve swelling of the face/tongue/throat, SOB, or low BP? No Did it involve sudden or severe rash/hives, skin peeling, or any reaction on the inside of your mouth or nose? Yes Did you need to seek medical attention at a hospital or doctor's office? Unknown When did it last happen?      Over 10 years If all above answers are "NO", may proceed with cephalosporin use.        Medication List     STOP taking these medications    hydrOXYzine 10 MG tablet Commonly known as: ATARAX/VISTARIL   pantoprazole 40 MG tablet Commonly known as: PROTONIX    pantoprazole sodium 40 mg/20 mL Pack Commonly known as: PROTONIX       TAKE these medications    apixaban 5 MG Tabs tablet Commonly known as: ELIQUIS Take 1 tablet (5 mg total) by mouth 2 (two) times daily. Restart taking only after next Monday(8/22) Start taking on: December 20, 2020 What changed:  additional instructions These instructions start on December 20, 2020. If you are unsure what to do until then, ask your doctor or other care provider.   arformoterol 15 MCG/2ML Nebu Commonly known as: BROVANA Take 2 mLs (15 mcg total) by nebulization 2 (two) times daily.   budesonide 0.5 MG/2ML nebulizer solution Commonly known as: PULMICORT Take 2 mLs (0.5 mg total) by nebulization 2 (two) times daily.   dexamethasone 4 MG tablet Commonly known as: DECADRON Place 1 tablet (4 mg total) into feeding tube 2 (two) times daily.   diclofenac Sodium 1 % Gel Commonly known as: VOLTAREN Apply 2 g topically 4 (four) times daily as needed (neck/back pain). What changed: when to take this   diphenhydrAMINE 12.5 MG/5ML elixir Commonly known as: BENADRYL Place 5 mLs (12.5 mg total) into feeding tube every 6 (six) hours as needed  for itching. What changed: how to take this   escitalopram 5 MG tablet Commonly known as: LEXAPRO Place 1 tablet (5 mg total) into feeding tube daily. What changed: how to take this   famotidine 20 MG tablet Commonly known as: PEPCID Place 1 tablet (20 mg total) into feeding tube daily.   feeding supplement (OSMOLITE 1.5 CAL) Liqd Place 237 mLs into feeding tube 3 (three) times daily.   folic acid 1 MG tablet Commonly known as: FOLVITE Place 1 tablet (1 mg total) into feeding tube daily. What changed: how to take this   furosemide 40 MG tablet Commonly known as: LASIX Place 1 tablet (40 mg total) into feeding tube daily. Start taking On Thursday(12/16/20) Start taking on: December 16, 2020 What changed:  how much to take how to take this additional  instructions These instructions start on December 16, 2020. If you are unsure what to do until then, ask your doctor or other care provider.   gabapentin 300 MG capsule Commonly known as: NEURONTIN Place 2 capsules (600 mg total) into feeding tube at bedtime. What changed: how to take this   ipratropium-albuterol 0.5-2.5 (3) MG/3ML Soln Commonly known as: DUONEB Take 3 mLs by nebulization every 6 (six) hours as needed.   ketotifen 0.025 % ophthalmic solution Commonly known as: ZADITOR Place 1 drop into both eyes 2 (two) times daily.   levETIRAcetam 100 MG/ML solution Commonly known as: KEPPRA Place 7.5 mLs (750 mg total) into feeding tube 2 (two) times daily.   lidocaine 5 % Commonly known as: LIDODERM Place 1 patch onto the skin daily. Remove & Discard patch within 12 hours or as directed by MD What changed: when to take this   midodrine 5 MG tablet Commonly known as: PROAMATINE Place 1 tablet (5 mg total) into feeding tube 2 (two) times daily with a meal. What changed: how to take this   ondansetron 8 MG tablet Commonly known as: ZOFRAN Place 1 tablet (8 mg total) into feeding tube every 8 (eight) hours as needed for nausea or vomiting. What changed: how to take this   oxyCODONE 5 MG immediate release tablet Commonly known as: Oxy IR/ROXICODONE Take 1 tablet (5 mg total) by mouth every 6 (six) hours as needed for moderate pain.   polyethylene glycol powder 17 GM/SCOOP powder Commonly known as: GLYCOLAX/MIRALAX Dissolve 1 capful (17 g) in liquid and add into feeding tube daily as needed for moderate constipation.   potassium chloride SA 20 MEQ tablet Commonly known as: KLOR-CON Take 1 tablet (20 mEq total) by mouth daily. What changed: when to take this   prochlorperazine 10 MG tablet Commonly known as: COMPAZINE Place 1 tablet (10 mg total) into feeding tube every 6 (six) hours as needed for nausea or vomiting. What changed: how to take this   vitamin B-12 1000  MCG tablet Commonly known as: CYANOCOBALAMIN Place 0.5 tablets (500 mcg total) into feeding tube daily. What changed:  medication strength how to take this       ASK your doctor about these medications    scopolamine 1 MG/3DAYS Commonly known as: TRANSDERM-SCOP Place 1 patch (1.5 mg total) onto the skin every 3 (three) days.               Durable Medical Equipment  (From admission, onward)           Start     Ordered   12/08/20 1418  For home use only DME Tube feeding  Once  Comments: Transition to bolus TF via PEG: -241ml Osmolite 1.5 TID -Flush with 59ml free water before and after each TF bolus   TF regimen provides 1065 kcals, 44 grams protein, and 558ml free water per day (970ml total free water with flushes). This meets 66.5% and 40% of pt's minimum estimated calorie and protein needs, respectively.   12/08/20 1417   12/07/20 1438  For home use only DME Other see comment  Once       Comments: Size 6 uncuffed shiley disposable trachs--6 month supply  Question:  Length of Need  Answer:  6 Months   12/07/20 1439   12/07/20 1349  For home use only DME lightweight manual wheelchair with seat cushion  Once       Comments: Patient suffers from weakness which impairs their ability to perform daily activities like bathing and toileting in the home.  A walker will not resolve  issue with performing activities of daily living. A wheelchair will allow patient to safely perform daily activities. Patient is not able to propel themselves in the home using a standard weight wheelchair due to general weakness. Patient can self propel in the lightweight wheelchair. Length of need Lifetime. Accessories: elevating leg rests (ELRs), wheel locks, extensions and anti-tippers.   12/07/20 1348   12/07/20 1348  For home use only DME Hospital bed  Once       Question Answer Comment  Length of Need Lifetime   The above medical condition requires: Patient requires the ability to  reposition frequently   Head must be elevated greater than: 30 degrees   Bed type Semi-electric   Support Surface: Gel Overlay      12/07/20 1348            Follow-up Information     Dawley, Troy C, DO Follow up in 3 month(s).   Contact information: [address] [place] [address] [phone]         [facility] Schedule an appointment as soon as possible for a visit in 1 week(s).   Contact information: [address] [place] [address] [phone]                Allergies  Allergen Reactions   Codeine Hives and Itching    Reports itching only per RN   Penicillins Hives    Did it involve swelling of the face/tongue/throat, SOB, or low BP? No Did it involve sudden or severe rash/hives, skin peeling, or any reaction on the inside of your mouth or nose? Yes Did you need to seek medical attention at a hospital or doctor's office? Unknown When did it last happen?      Over 10 years If all above answers are "NO", may proceed with cephalosporin use.     Consultations: ENT,PCCM, ENT, neurosurgery   Procedures/Studies: CT ABDOMEN PELVIS WO CONTRAST  Result Date: 12/09/2020 CLINICAL DATA:  Abdominal pain, rule out hemorrhage, dropping hemoglobin EXAM: CT ABDOMEN AND PELVIS WITHOUT CONTRAST TECHNIQUE: Multidetector CT imaging of the abdomen and pelvis was performed following the standard protocol without IV contrast. COMPARISON:  04/24/2020 FINDINGS: Lower chest: Extensive dependent bibasilar atelectasis or consolidation with air bronchograms. Hepatobiliary: No focal liver abnormality is seen. Status post cholecystectomy. No biliary dilatation. Pancreas: Unremarkable. No pancreatic ductal dilatation or surrounding inflammatory changes. Spleen: Normal in size without significant abnormality. Adrenals/Urinary Tract: Adrenal glands are unremarkable. Fluid attenuation cysts of the left kidney. No hydronephrosis. Bladder is  unremarkable. Stomach/Bowel:  Percutaneous gastrostomy tube. Appendix appears normal. No evidence of bowel wall thickening, distention, or inflammatory changes. Descending and sigmoid diverticulosis. Vascular/Lymphatic: No significant vascular findings are present. No enlarged abdominal or pelvic lymph nodes. Reproductive: No mass or other significant abnormality. Other: There is a large right rectus sheath hematoma measuring 13.7 x 9.4 x 9.2 cm (series 3, image 64, series 7, image 113). Midline diastasis and prior ventral hernia mesh repair. Anasarca. Small volume free fluid in the low pelvis. Musculoskeletal: No acute or significant osseous findings. IMPRESSION: 1. There is a large right rectus sheath hematoma measuring 13.7 x 9.4 x 9.2 cm. 2. Extensive dependent bibasilar atelectasis or consolidation with air bronchograms, appearance concerning for infection or aspiration. 3. Small volume free fluid in the low pelvis. These results will be called to the ordering clinician or representative by the Radiologist Assistant, and communication documented in the PACS or [employer]. Electronically Signed   By: Eddie Candle M.D.   On: 12/09/2020 13:43   CT Head Wo Contrast  Result Date: 11/28/2020 CLINICAL DATA:  Seizure. EXAM: CT HEAD WITHOUT CONTRAST TECHNIQUE: Contiguous axial images were obtained from the base of the skull through the vertex without intravenous contrast. COMPARISON:  Brain CT 04/27/2020 FINDINGS: Brain: Ventricles and sulci are appropriate for patient's age. Motion artifact limits evaluation. Redemonstrated probable extra-axial mass within the right parietal region measuring up to 3.4 cm with some associated calcification. There appears to be associated vasogenic edema within the right occipital and parietal lobe. No evidence for associated acute process. Vascular: Unremarkable. Skull: Intact. Sinuses/Orbits: Paranasal sinuses are well aerated. Mastoid air cells are unremarkable. Other: None.  IMPRESSION: Redemonstrated mass overlying the right parietal lobe with mild calcification, potentially representing a meningioma. There is extensive vasogenic edema throughout the right parietal lobe. Recommend further evaluation with pre and post contrast-enhanced MRI brain. Electronically Signed   By: Lovey Newcomer M.D.   On: 11/28/2020 19:48   MR BRAIN W WO CONTRAST  Result Date: 11/30/2020 CLINICAL DATA:  Staging of laryngeal squamous cell carcinoma EXAM: MRI HEAD WITHOUT AND WITH CONTRAST TECHNIQUE: Multiplanar, multiecho pulse sequences of the brain and surrounding structures were obtained without and with intravenous contrast. CONTRAST:  6mL GADAVIST GADOBUTROL 1 MMOL/ML IV SOLN COMPARISON:  None. FINDINGS: Brain: Superior right convexity meningioma measures 3.1 x 3.9 x 2.6 cm. There is moderate vasogenic edema within the posterior right hemisphere. There is 3 mm of leftward midline shift. No acute intracranial hemorrhage. Vascular: Major flow voids are preserved. Skull and upper cervical spine: Normal calvarium and skull base. Visualized upper cervical spine and soft tissues are normal. Sinuses/Orbits:No paranasal sinus fluid levels or advanced mucosal thickening. No mastoid or middle ear effusion. Normal orbits. IMPRESSION: 1. Superior right convexity meningioma measuring 3.1 x 3.9 x 2.6 cm with moderate vasogenic edema within the posterior right hemisphere and 3 mm of leftward midline shift. 2. No acute intracranial hemorrhage. Electronically Signed   By: Ulyses Jarred M.D.   On: 11/30/2020 02:48   IR REPLACE G-TUBE SIMPLE WO FLUORO  Result Date: 11/30/2020 INDICATION: History of dysphagia s/p 16 Fr balloon retention gastrostomy placement 03/22/20 in IR found to have dislodged G tube with 16 Fr Foley currently in tract. Request for bedside replacement of balloon retention gastrostomy tube. EXAM: BEDSIDE REPLACEMENT OF GASTROSTOMY TUBE COMPARISON:  None. MEDICATIONS: Viscous lidocaine CONTRAST:  None  FLUOROSCOPY TIME:  None COMPLICATIONS: None immediate. PROCEDURE: The balloon of the existing 16 Fr Foley within the gastrostomy tract was deflated and the  Foley was easily removed. The 16 Fr balloon gastrostomy tube was lubricated with viscous lidocaine and inserted into the existing tract without difficulty. The balloon was inflated with normal saline and pulled against the anterior inner lumen of the stomach, the external disc was cinched to the skin. The gastrostomy tube was flushed easily with 10 cc normal saline and gastric contents aspirated to confirm positioning. IMPRESSION: Successful bedside replacement of a new 16-French gastrostomy tube. The gastrostomy tube is ready for immediate use. Read by Candiss Norse, PA-C Electronically Signed   By: Michaelle Birks MD   On: 11/30/2020 12:06   DG CHEST PORT 1 VIEW  Result Date: 12/12/2020 CLINICAL DATA:  Shortness of breath.  Bronchitis. EXAM: PORTABLE CHEST 1 VIEW COMPARISON:  December 09, 2020 FINDINGS: Bibasilar opacities with small effusions are stable. The cardiomediastinal silhouette is stable. Tracheostomy tube is stable. No pneumothorax. No other changes. IMPRESSION: Persistent small effusions and bibasilar opacities. The bibasilar opacities may represent atelectasis or infiltrate. Recommend attention on follow-up. Electronically Signed   By: Dorise Bullion III M.D.   On: 12/12/2020 12:42   DG CHEST PORT 1 VIEW  Result Date: 12/09/2020 CLINICAL DATA:  Dyspnea. EXAM: PORTABLE CHEST 1 VIEW COMPARISON:  December 05, 2020. FINDINGS: Stable cardiomediastinal silhouette. Tracheostomy tube is unchanged in position. No pneumothorax is noted. Stable bibasilar subsegmental atelectasis or edema is noted with small pleural effusions. Bony thorax is unremarkable. IMPRESSION: Stable bibasilar subsegmental atelectasis or edema is noted with small pleural effusions. Electronically Signed   By: Marijo Conception M.D.   On: 12/09/2020 15:51   DG CHEST PORT 1  VIEW  Result Date: 12/05/2020 CLINICAL DATA:  Shortness of breath. EXAM: PORTABLE CHEST 1 VIEW COMPARISON:  12/01/2020 and prior studies FINDINGS: Tracheostomy tube in bilateral LOWER lung consolidation/atelectasis again noted. Bilateral pleural effusions are again noted. There is no evidence of pneumothorax. Pulmonary vascular congestion is again identified. IMPRESSION: Bilateral LOWER lung consolidation/atelectasis and bilateral pleural effusions again noted. Electronically Signed   By: Margarette Canada M.D.   On: 12/05/2020 12:20   DG CHEST PORT 1 VIEW  Result Date: 12/01/2020 CLINICAL DATA:  Hypoxia, tracheostomy EXAM: PORTABLE CHEST 1 VIEW COMPARISON:  11/28/2020 FINDINGS: Single frontal view of the chest demonstrates stable tracheostomy tube. There has been interval progression of the left basilar consolidation seen on prior x-ray. Bilateral pleural effusions have developed as well, left greater than right. No pneumothorax. No acute bony abnormalities. IMPRESSION: 1. Progressive left basilar consolidation, with interval development of bilateral pleural effusions. Electronically Signed   By: Randa Ngo M.D.   On: 12/01/2020 21:27   DG Chest Port 1 View  Result Date: 11/28/2020 CLINICAL DATA:  Patient with weakness. EXAM: PORTABLE CHEST 1 VIEW COMPARISON:  Chest radiograph 04/29/2020 FINDINGS: Tracheostomy tube terminates at the thoracic inlet. Monitoring leads overlie the patient. Stable enlarged cardiac and mediastinal contours. Mild left basilar consolidation. No pleural effusion or pneumothorax. IMPRESSION: Patchy consolidation within the left lower lung may represent atelectasis or infection. Electronically Signed   By: Lovey Newcomer M.D.   On: 11/28/2020 17:51   DG Swallowing Func-Speech Pathology  Result Date: 12/01/2020 Formatting of this result is different from the original. Objective Swallowing Evaluation: Type of Study: MBS-Modified Barium Swallow Study  Patient Details Name: Veronica Horton  MRN: [id number] Date of Birth: [birth year] Today's Date: 12/01/2020 Time: SLP Start Time (ACUTE ONLY): 0930 -SLP Stop Time (ACUTE ONLY): 1000 SLP Time Calculation (min) (ACUTE ONLY): 30 min Past Medical History: Past Medical  History: Diagnosis Date  Bronchitis   Class 3 obesity 01/23/2020  COPD (chronic obstructive pulmonary disease) (HCC)   Degenerative disc disease, lumbar   Hiatal hernia   Hypertension   Throat cancer [facility])  Past Surgical History: Past Surgical History: Procedure Laterality Date  BIOPSY  04/12/2020  Procedure: BIOPSY;  Surgeon: Wilford Corner, MD;  Location: [facility];  Service: Gastroenterology;;  CHOLECYSTECTOMY    degenerative bone disease    DIRECT LARYNGOSCOPY N/A 03/13/2020  Procedure: DIRECT LARYNGOSCOPY WITH BIOPSY;  Surgeon: Jason Coop, DO;  Location: [place];  Service: ENT;  Laterality: N/A;  FLEXIBLE SIGMOIDOSCOPY N/A 04/12/2020  Procedure: Beryle Quant;  Surgeon: Wilford Corner, MD;  Location: [facility];  Service: Gastroenterology;  Laterality: N/A;  INCISIONAL HERNIA REPAIR N/A 01/15/2019  Procedure: [name] HERNIORRHAPHY WITH MESH;  Surgeon: [name], MD;  Location: [facility];  Service: General;  Laterality: N/A;  IR GASTROSTOMY TUBE MOD SED  03/22/2020  IR [name] TUBE CHANGE  07/24/2020  IR RADIOLOGIST EVAL & MGMT  05/07/2020  IR REPLACE G-TUBE SIMPLE WO FLUORO  11/30/2020  OMENTECTOMY N/A 01/15/2019  Procedure: OMENTECTOMY;  Surgeon: [name], MD;  Location: [facility];  Service: General;  Laterality: N/A;  POLYPECTOMY  04/12/2020  Procedure: POLYPECTOMY;  Surgeon: Wilford Corner, MD;  Location: [facility];  Service: Gastroenterology;;  TRACHEOSTOMY TUBE PLACEMENT N/A 03/13/2020  Procedure: AWAKE TRACHEOSTOMY;  Surgeon: Jason Coop, DO;  Location: [place];  Service: ENT;  Laterality: N/A; HPI: 60 year old woman with PMHx significant for laryngeal squamous cell carcinoma (s/p trach on 3L trach collar at home), chronic PEG tube, COPD, HTN, obesity who  presented to the ED with generalized weakness and then experienced generalized tonic-clonic shaking with concern for seizure activity. Of note, she was admitted with aspiration PNA 03/2020 and CT Head at that time showed extensive vasogenic edema R parietal/occipital region with concern for meningioma.  Patient refused an MRI at that time and was to follow with neurosurgery outpatient; she states that she did not follow up for insurance reasons.  CT head shows redemonstrated mass in the R parietal lobe with extensive vasogenic edema throughout the R parietal lobe. 8/1 vent via chronic trach; 8/2 transitioned to ATC.  Agitated and self-removed PEG 8/2. Completed radiation and chemo 05/31/20. Last PET 6/22 with decreased size of supraglottic mass.  Pt has been followed by SLP during prior admissions, undergoing MBS and PMV evaluations.   Last MBS was11/27/21- dys3/thin liquids was recommended.  Subjective: quiet, follows commands Assessment / Plan / Recommendation CHL IP CLINICAL IMPRESSIONS 12/01/2020 Clinical Impression Pt's swallow function is mildly worse than when last assessed via MBS in December of 2021.  Presence of mass within larynx continues to interfere with laryngeal closure, allowing trace amounts of thin and nectar thick liquids to penetrate into the larynx and eventually pass below the vocal folds into the trachea.  Aspiration is trace - it did not elicit a cough response.  Presence of PMV did not improve airway protection.  Pt's Sp02 dropped from 88% to 83% during MBS - difficult to attribute impact of PMV to desaturation. 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 Recommend allowing purees/soft solids and honey-thick liquids for now since PEG can be used for primary nutrition.  Pt will benefit from further conversations about oral nutrition in the context of her chronic medical issues as well as current dx of  meningioma. Doubt she will be satisfied with diet  restrictions in the long-term.  D/W RN. SLP Visit Diagnosis Dysphagia, pharyngeal phase (R13.13) Attention and concentration deficit following -- Frontal lobe and executive function deficit following -- Impact on safety and function Mild aspiration risk   CHL IP TREATMENT RECOMMENDATION 12/01/2020 Treatment Recommendations Therapy as outlined in treatment plan below   Prognosis 12/01/2020 Prognosis for Safe Diet Advancement Fair Barriers to Reach Goals -- Barriers/Prognosis Comment -- CHL IP DIET RECOMMENDATION 12/01/2020 SLP Diet Recommendations Dysphagia 1 (Puree) solids;Honey thick liquids Liquid Administration via Cup Medication Administration Via alternative means Compensations Small sips/bites Postural Changes Seated upright at 90 degrees   CHL IP OTHER RECOMMENDATIONS 12/01/2020 Recommended Consults -- Oral Care Recommendations Oral care BID Other Recommendations Order thickener from pharmacy   CHL IP FOLLOW UP RECOMMENDATIONS 12/01/2020 Follow up Recommendations Other (comment)   CHL IP FREQUENCY AND DURATION 12/01/2020 Speech Therapy Frequency (ACUTE ONLY) min 2x/week Treatment Duration 1 week      CHL IP ORAL PHASE 12/01/2020 Oral Phase WFL Oral - Pudding Teaspoon -- Oral - Pudding Cup -- Oral - Honey Teaspoon -- Oral - Honey Cup -- Oral - Nectar Teaspoon -- Oral - Nectar Cup -- Oral - Nectar Straw -- Oral - Thin Teaspoon -- Oral - Thin Cup -- Oral - Thin Straw -- Oral - Puree -- Oral - Mech Soft -- Oral - Regular -- Oral - Multi-Consistency -- Oral - Pill -- Oral Phase - Comment --  CHL IP PHARYNGEAL PHASE 12/01/2020 Pharyngeal Phase Impaired Pharyngeal- Pudding Teaspoon -- Pharyngeal -- Pharyngeal- Pudding Cup -- Pharyngeal -- Pharyngeal- Honey Teaspoon -- Pharyngeal -- Pharyngeal- Honey Cup -- Pharyngeal -- Pharyngeal- Nectar Teaspoon -- Pharyngeal -- Pharyngeal- Nectar Cup -- Pharyngeal -- Pharyngeal- Nectar Straw Reduced anterior laryngeal mobility;Reduced laryngeal  elevation;Reduced airway/laryngeal closure;Penetration/Aspiration during swallow;Trace aspiration Pharyngeal Material enters airway, passes BELOW cords without attempt by patient to eject out (silent aspiration) Pharyngeal- Thin Teaspoon -- Pharyngeal -- Pharyngeal- Thin Cup Reduced anterior laryngeal mobility;Reduced laryngeal elevation;Reduced airway/laryngeal closure;Penetration/Aspiration during swallow Pharyngeal Material enters airway, passes BELOW cords without attempt by patient to eject out (silent aspiration) Pharyngeal- Thin Straw Reduced anterior laryngeal mobility;Reduced laryngeal elevation;Reduced airway/laryngeal closure;Penetration/Aspiration during swallow Pharyngeal Material enters airway, passes BELOW cords without attempt by patient to eject out (silent aspiration) Pharyngeal- Puree WFL Pharyngeal -- Pharyngeal- Mechanical Soft -- Pharyngeal -- Pharyngeal- Regular -- Pharyngeal -- Pharyngeal- Multi-consistency -- Pharyngeal -- Pharyngeal- Pill -- Pharyngeal -- Pharyngeal Comment --  CHL IP CERVICAL ESOPHAGEAL PHASE 04/26/2020 Cervical Esophageal Phase -- Pudding Teaspoon -- Pudding Cup -- Honey Teaspoon -- Honey Cup -- Nectar Teaspoon -- Nectar Cup -- Nectar Straw -- Thin Teaspoon -- Thin Cup Prominent cricopharyngeal segment Thin Straw -- Puree -- Mechanical Soft -- Regular -- Multi-consistency -- Pill -- Cervical Esophageal Comment no backflow observed during SLP MBS Juan Quam Laurice 12/01/2020, 11:19 AM              EEG adult  Result Date: 11/29/2020 Lora Havens, MD     11/29/2020 11:01 AM Patient Name: Veronica Horton MRN: [id number] Epilepsy Attending: Lora Havens Referring Physician/Provider: Mickel Baas Gleason, PA Date: 11/29/2020 Duration: 23.04 mins Patient [id number]:82MH F with 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 EEG to evaluate for seizure Level of alertness: Awake, asleep AEDs during EEG study: LEV Technical aspects: This EEG study was  done with scalp electrodes positioned according to the 10-20 International system of electrode placement. Electrical activity was acquired at a sampling rate of 500Hz  and reviewed  with a high frequency filter of 70Hz  and a low frequency filter of 1Hz . EEG data were recorded continuously and digitally stored. Description: The posterior dominant rhythm consists of 8-9Hz  activity of moderate voltage (25-35 uV) seen predominantly in posterior head regions, symmetric and reactive to eye opening and eye closing. Sleep was characterized by vertex waves, sleep spindles (12 to 14 Hz), maximal frontocentral region. EEG showed intermittent generalized 3 to 6 Hz theta-delta slowing. Hyperventilation and photic stimulation were not performed.   ABNORMALITY - Intermittent slow, generalized IMPRESSION: This study is suggestive of mild diffuse encephalopathy, nonspecific etiology. No seizures or epileptiform discharges were seen throughout the recording. Priyanka Barbra Sarks      Subjective: Patient seen and examined the bedside this morning.  Hemodynamically stable for discharge.  Discharge Exam: Vitals:   12/13/20 0802 12/13/20 1152  BP:  (!) 91/57  Pulse: (!) 43 (!) 44  Resp: 18 18  Temp:  98.1 F (36.7 C)  SpO2: 93%    Vitals:   12/13/20 0736 12/13/20 0741 12/13/20 0802 12/13/20 1152  BP: 105/61 105/61  (!) 91/57  Pulse: (!) 48 (!) 48 (!) 43 (!) 44  Resp: 13 19 18 18   Temp: 97.9 F (36.6 C) 97.9 F (36.6 C)  98.1 F (36.7 C)  TempSrc: Oral Oral  Oral  SpO2:   93%   Weight:      Height:        General: Pt is alert, awake, not in acute distress,trach Cardiovascular: RRR, S1/S2 +, no rubs, no gallops Respiratory: CTA bilaterally, no wheezing, no rhonchi Abdominal: Soft, NT, ND, bowel sounds +,PEG Extremities: no edema, no cyanosis    The results of significant diagnostics from this hospitalization (including imaging, microbiology, ancillary and laboratory) are listed below for reference.      Microbiology: Recent Results (from the past 240 hour(s))  Culture, blood (routine x 2)     Status: None   Collection Time: 12/05/20  9:21 AM   Specimen: BLOOD RIGHT ARM  Result Value Ref Range Status   Specimen Description BLOOD RIGHT ARM  Final   Special Requests   Final    BOTTLES DRAWN AEROBIC ONLY Blood Culture adequate volume   Culture   Final    NO GROWTH 5 DAYS Performed at [facility], [address] [place], [place] [address]    Report Status 12/10/2020 FINAL  Final  Culture, blood (routine x 2)     Status: None   Collection Time: 12/05/20  9:35 AM   Specimen: BLOOD RIGHT HAND  Result Value Ref Range Status   Specimen Description BLOOD RIGHT HAND  Final   Special Requests   Final    BOTTLES DRAWN AEROBIC AND ANAEROBIC Blood Culture results may not be optimal due to an inadequate volume of blood received in culture bottles   Culture   Final    NO GROWTH 5 DAYS Performed at [facility], [place] [address], [place], [place] [address]    Report Status 12/10/2020 FINAL  Final     Labs: BNP (last 3 results) Recent Labs    01/23/20 0234 03/10/20 1227 03/29/20 0515  BNP 50.0 183.0* 24.2   Basic Metabolic Panel: Recent Labs  Lab 12/09/20 0125 12/10/20 0507 12/11/20 0353 12/12/20 0344 12/13/20 0216  NA 135 136 136 136 136  K 4.1 4.5 4.4 4.6 4.6  CL 94* 93* 94* 97* 96*  CO2 31 32 33* 30 31  GLUCOSE 130* 116* 110* 89 124*  BUN 60* 58* 57* 54* 51*  CREATININE 1.92* 2.10* 2.07* 2.00* 2.17*  CALCIUM 8.7* 8.9 8.5* 8.7* 8.9   Liver Function Tests: Recent Labs  Lab 12/10/20 0507  AST 26  ALT 17  ALKPHOS 43  BILITOT 0.6  PROT 6.2*  ALBUMIN 2.8*   No results for input(s): LIPASE, AMYLASE in the last 168 hours. No results for input(s): AMMONIA in the last 168 hours. CBC: Recent Labs  Lab 12/09/20 0424 12/09/20 0635 12/10/20 0507 12/11/20 0353 12/12/20 0344 12/13/20 0216  WBC 12.0*  --  14.6* 13.4* 12.7* 13.0*  NEUTROABS 10.6*  --   13.1* 12.3* 11.5* 11.7*  HGB 6.5* 6.5* 6.6* 7.6* 9.2* 9.4*  HCT 19.4* 19.6* 19.6* 22.6* 27.6* 28.4*  MCV 100.0  --  100.0 97.0 98.2 98.3  PLT 110*  --  121* 117* 141* 160   Cardiac Enzymes: No results for input(s): CKTOTAL, CKMB, CKMBINDEX, TROPONINI in the last 168 hours. BNP: Invalid input(s): POCBNP CBG: Recent Labs  Lab 12/12/20 2132 12/12/20 2337 12/13/20 0423 12/13/20 0732 12/13/20 1149  GLUCAP 100* 95 106* 96 82   D-Dimer No results for input(s): DDIMER in the last 72 hours. Hgb A1c No results for input(s): HGBA1C in the last 72 hours. Lipid Profile No results for input(s): CHOL, HDL, LDLCALC, TRIG, CHOLHDL, LDLDIRECT in the last 72 hours. Thyroid function studies No results for input(s): TSH, T4TOTAL, T3FREE, THYROIDAB in the last 72 hours.  Invalid input(s): FREET3 Anemia work up No results for input(s): VITAMINB12, FOLATE, FERRITIN, TIBC, IRON, RETICCTPCT in the last 72 hours. Urinalysis    Component Value Date/Time   COLORURINE YELLOW 12/05/2020 1100   APPEARANCEUR CLEAR 12/05/2020 1100   LABSPEC 1.019 12/05/2020 1100   PHURINE 5.0 12/05/2020 1100   GLUCOSEU NEGATIVE 12/05/2020 1100   HGBUR NEGATIVE 12/05/2020 1100   BILIRUBINUR NEGATIVE 12/05/2020 1100   KETONESUR NEGATIVE 12/05/2020 1100   PROTEINUR NEGATIVE 12/05/2020 1100   UROBILINOGEN 0.2 09/25/2010 0530   NITRITE NEGATIVE 12/05/2020 1100   LEUKOCYTESUR NEGATIVE 12/05/2020 1100   Sepsis Labs Invalid input(s): PROCALCITONIN,  WBC,  LACTICIDVEN Microbiology Recent Results (from the past 240 hour(s))  Culture, blood (routine x 2)     Status: None   Collection Time: 12/05/20  9:21 AM   Specimen: BLOOD RIGHT ARM  Result Value Ref Range Status   Specimen Description BLOOD RIGHT ARM  Final   Special Requests   Final    BOTTLES DRAWN AEROBIC ONLY Blood Culture adequate volume   Culture   Final    NO GROWTH 5 DAYS Performed at [facility], [address], [place], [place] [address]     Report Status 12/10/2020 FINAL  Final  Culture, blood (routine x 2)     Status: None   Collection Time: 12/05/20  9:35 AM   Specimen: BLOOD RIGHT HAND  Result Value Ref Range Status   Specimen Description BLOOD RIGHT HAND  Final   Special Requests   Final    BOTTLES DRAWN AEROBIC AND ANAEROBIC Blood Culture results may not be optimal due to an inadequate volume of blood received in culture bottles   Culture   Final    NO GROWTH 5 DAYS Performed at [facility], [place] [address] [place], [place] [address]    Report Status 12/10/2020 FINAL  Final    Please note: You were cared for by a hospitalist during your hospital stay. Once you are discharged, your primary care physician will handle any further medical  issues. Please note that NO REFILLS for any discharge medications will be authorized once you are discharged, as it is imperative that you return to your primary care physician (or establish a relationship with a primary care physician if you do not have one) for your post hospital discharge needs so that they can reassess your need for medications and monitor your lab values.    Time coordinating discharge: 40 minutes  SIGNED:   Shelly Coss, MD  [facility] 12/13/2020, 12:52 PM Pager [phone]  If 7PM-7AM, please contact night-coverage [email or web address] Password [email or web address]

## 2020-12-13 NOTE — TOC Transition Note (Signed)
Transition of Care [facility]) - CM/SW Discharge Note   Patient Details  Name: Veronica Horton MRN: [id number] Date of Birth: 1960-05-19  Transition of Care [facility]) CM/SW Contact:  Pollie Friar, RN Phone Number: 12/13/2020, 3:48 PM   Clinical Narrative:    Patient is discharging home today with [facility] services through [facility] and they are aware of her d/c home.  [facility] trach team is starting insurance to see if she will qualify for services in the home. Information they requested was faxed to [facility].  CM has requested aides through her Medicaid company and they are seeing if she qualifies.  Pt has wheelchair for home at the bedside.  Medications for home delivered to the room per [place].  Pt states she has tube feedings at home. [employer] aware of d/c and will send new supplies out this week. Pt states she has trach supplies at home. She states she has a size 4 at home and CM has sent 2 size 6 trachs with her. She has suction catheters and cleaning kits.  Pts oxygen concentrator at home goes to 10 L per [employer]. Pt is currently using 8L. She states she has oxygen tubing at home.  Pt has suction machine already at home and tubing for it.  Pts sister and cousin to provide transport home. They are bringing oxygen for the transport.   Final next level of care: Home w Home Health Services Barriers to Discharge: No Barriers Identified   Patient Goals and CMS Choice   CMS Medicare.gov Compare Post Acute Care list provided to:: Patient Choice offered to / list presented to : Patient  Discharge Placement                       Discharge Plan and Services   Discharge Planning Services: CM Consult Post Acute Care Choice: Home Health, Durable Medical Equipment          DME Arranged: Lightweight manual wheelchair with seat cushion DME Agency: [employer] Date DME Agency Contacted: 12/07/20   Representative spoke with at DME Agency: [place]: RN, PT, OT, Nurse's Aide [place]  Agency:  Kathaleen Bury) Date HH Agency Contacted: 12/13/20   Representative spoke with at [place]: [place] updated on d/c home today  Social Determinants of Health ([place]) Interventions     Readmission Risk Interventions Readmission Risk Prevention Plan 04/26/2020 01/20/2019  Medication Screening - Complete  Transportation Screening Complete Complete  Medication Review ([place]) Complete -  PCP or Specialist appointment within 3-5 days of discharge Complete -  [place] or Home Care Consult Complete -  SW Recovery Care/Counseling Consult Complete -  [place] Patient Refused -  Some recent data might be hidden

## 2020-12-13 NOTE — Plan of Care (Signed)
  Problem: Inadequate Intake (NI-2.1) Goal: Food and/or nutrient delivery Description: Individualized approach for food/nutrient provision. Outcome: Adequate for Discharge

## 2020-12-14 ENCOUNTER — Inpatient Hospital Stay (HOSPITAL_COMMUNITY)
Admission: RE | Admit: 2020-12-14 | Discharge: 2020-12-14 | Disposition: A | Discharge status: Home / Self Care | Payer: Medicaid Other | Source: Ambulatory Visit

## 2021-01-06 ENCOUNTER — Other Ambulatory Visit: Payer: Self-pay

## 2021-01-06 ENCOUNTER — Inpatient Hospital Stay (HOSPITAL_COMMUNITY): Payer: Medicaid Other | Attending: Hematology | Admitting: Dietician

## 2021-01-06 NOTE — Progress Notes (Signed)
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 She is s/p PEG. Laryngoscopy 9/2 concerning  for persistent disease. Patient is considering salvage laryngectomy. She is followed by Dr. Hendricks Limes at [facility]  Met with patient and sister in clinic. Patient reports having a good appetite, eating "whatever she can get her hands on." She recalls usually 1 meal day. Yesterday she had meatloaf, mac/cheese, potato salad, yeast roll, spice cake. Patient is drinking 1-2 Ensure Plus, water, tea, and coffee. Patient requesting complimentary case of Ensure today. Patient continues to use feeding tube, she reports 2 cartons Osmolite 1.5. She flushes with 60 ml water before and after. Patient reports she has plenty of formula and supplies. Patient denies nausea, vomiting, constipation, diarrhea. Patient reports she has not decided if she wants to have surgery, she has pre-op appointment scheduled for 9/9.   Medications: reviewed  Labs: reviewed  Anthropometrics: Last weight 234 lb 3.2 oz on 8/15 increased 5 lbs from 229 lb 11.2 oz on 4/11  3/14 - 229 lb 4.8 oz 2/11 - 235 lb 6.4 oz 1/28 - 245 lb 3.2 oz   Estimated Energy Needs  Kcals: 2300-2500 Protein: 130-145 Fluid: 2.3 L  NUTRITION DIAGNOSIS: Inadequate oral intake improved, patient supplementing with tube feedings   INTERVENTION:  Educated on importance of adequate nutrition to support post-op healing  Continue eating soft, moist high protein foods as tolerated Continue giving 2 cartons Osmolite 1.5 via tube daily with 60 ml water flush before and after feedings Continue drinking 1-2 Ensure Plus/equivalent daily for added calories and protein, coupons provided Complimentary case of Ensure Plus provided today Patient and sister have contact information  MONITORING, EVALUATION, GOAL: weight trends, intake, tube feeding   NEXT VISIT: f/u via telephone ~ 6 weeks

## 2021-01-07 ENCOUNTER — Emergency Department (HOSPITAL_COMMUNITY): Payer: Medicaid Other

## 2021-01-07 ENCOUNTER — Emergency Department (HOSPITAL_COMMUNITY)
Admission: EM | Admit: 2021-01-07 | Discharge: 2021-01-07 | Disposition: A | Discharge status: Home / Self Care | Payer: Medicaid Other | Attending: Emergency Medicine | Admitting: Emergency Medicine

## 2021-01-07 ENCOUNTER — Encounter (HOSPITAL_COMMUNITY): Payer: Self-pay | Admitting: Emergency Medicine

## 2021-01-07 ENCOUNTER — Other Ambulatory Visit: Payer: Self-pay

## 2021-01-07 DIAGNOSIS — Z7951 Long term (current) use of inhaled steroids: Secondary | ICD-10-CM | POA: Diagnosis not present

## 2021-01-07 DIAGNOSIS — F17210 Nicotine dependence, cigarettes, uncomplicated: Secondary | ICD-10-CM | POA: Insufficient documentation

## 2021-01-07 DIAGNOSIS — I10 Essential (primary) hypertension: Secondary | ICD-10-CM | POA: Insufficient documentation

## 2021-01-07 DIAGNOSIS — Y733 Surgical instruments, materials and gastroenterology and urology devices (including sutures) associated with adverse incidents: Secondary | ICD-10-CM | POA: Insufficient documentation

## 2021-01-07 DIAGNOSIS — J441 Chronic obstructive pulmonary disease with (acute) exacerbation: Secondary | ICD-10-CM | POA: Diagnosis not present

## 2021-01-07 DIAGNOSIS — Z8521 Personal history of malignant neoplasm of larynx: Secondary | ICD-10-CM | POA: Insufficient documentation

## 2021-01-07 DIAGNOSIS — T85598A Other mechanical complication of other gastrointestinal prosthetic devices, implants and grafts, initial encounter: Secondary | ICD-10-CM | POA: Diagnosis not present

## 2021-01-07 DIAGNOSIS — K9420 Gastrostomy complication, unspecified: Secondary | ICD-10-CM

## 2021-01-07 DIAGNOSIS — Z7901 Long term (current) use of anticoagulants: Secondary | ICD-10-CM | POA: Diagnosis not present

## 2021-01-07 MED ORDER — STERILE WATER FOR INJECTION IJ SOLN
INTRAMUSCULAR | Status: AC
  Filled 2021-01-07: qty 10

## 2021-01-07 MED ORDER — IOHEXOL 300 MG/ML  SOLN
30.0000 mL | Freq: Once | INTRAMUSCULAR | Status: AC | PRN
  Administered 2021-01-07: 30 mL

## 2021-01-07 MED ORDER — DIATRIZOATE MEGLUMINE & SODIUM 66-10 % PO SOLN
20.0000 mL | Freq: Once | ORAL | Status: AC
  Administered 2021-01-07: 20 mL

## 2021-01-07 NOTE — Discharge Instructions (Addendum)
Continue to care for your g-tube the way you normally do.

## 2021-01-07 NOTE — ED Provider Notes (Signed)
Patient signed to me by Dr. Maryan Rued pending x-rays which showed tip of G-tube in the stomach.   Lacretia Leigh, MD 01/07/21 [phone]

## 2021-01-07 NOTE — ED Triage Notes (Signed)
Pt accidentally removed her feeding tube this AM, Veronica Horton, would like it replaced. No other complaints.

## 2021-01-07 NOTE — ED Provider Notes (Signed)
[facility] DEPT Provider Note   CSN: [id number] Arrival date & time: 01/07/21  1341     History No chief complaint on file.   Veronica Horton is a 60 y.o. female.  Patient is a 60 year old female with a history of COPD, hypertension, throat cancer status post tracheostomy and G-tube who was presenting today because her G-tube got ripped out.  She was walking by her walker and it got caught on it and pulled it out around 1130 today.  She does get Ensure through the feeding tube and they called their doctor who recommended she come to have it replaced.  She has a 57 [place].  She otherwise has no further complaints today.  The history is provided by the patient and medical records.      Past Medical History:  Diagnosis Date  . Bronchitis   . Class 3 obesity 01/23/2020  . COPD (chronic obstructive pulmonary disease) ([place])   . Degenerative disc disease, lumbar   . Hiatal hernia   . Hypertension   . Throat cancer [facility])     Patient Active Problem List   Diagnosis Date Noted  . Status epilepticus ([place]) 11/28/2020  . Seizure ([place]) 11/28/2020  . Pressure injury of skin   . Diarrhea   . Chronic respiratory failure ([place])   . SOB (shortness of breath)   . PNA (pneumonia) 04/24/2020  . Sepsis ([place]) 04/24/2020  . Abnormal findings on diagnostic imaging of abdomen 04/12/2020  . 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 Tracheostomy dependent ([place])   . Head and neck cancer ([place])   . Acute on chronic respiratory failure with hypoxia and hypercapnia ([place]) 03/13/2020  . Laryngeal mass 03/11/2020  . Acute respiratory failure with hypercapnia ([name]) 03/10/2020  . Acute exacerbation of chronic obstructive pulmonary disease (COPD) ([place]) 01/24/2020  . Acute respiratory failure with hypoxia and hypercapnia ([place]) 01/23/2020  . Class 3 obesity 01/23/2020  . Tobacco abuse 01/23/2020  . Pneumonia   . Aspiration pneumonitis ([place]) 01/17/2019  . Incisional hernia, without  obstruction or gangrene   . COPD with acute exacerbation ([place]) 01/14/2019  . Small bowel obstruction ([place]) 01/13/2019  . COPD (chronic obstructive pulmonary disease) ([place])   . Hypertension   . Acute respiratory failure with hypoxia ([place])   . Polycythemia     Past Surgical History:  Procedure Laterality Date  . BIOPSY  04/12/2020   Procedure: BIOPSY;  Surgeon: Wilford Corner, MD;  Location: [facility];  Service: Gastroenterology;;  . CHOLECYSTECTOMY    . degenerative bone disease    . DIRECT LARYNGOSCOPY N/A 03/13/2020   Procedure: DIRECT LARYNGOSCOPY WITH BIOPSY;  Surgeon: Jason Coop, DO;  Location: [facility];  Service: ENT;  Laterality: N/A;  . FLEXIBLE SIGMOIDOSCOPY N/A 04/12/2020   Procedure: FLEXIBLE SIGMOIDOSCOPY;  Surgeon: Wilford Corner, MD;  [facility];  Service: Gastroenterology;  Laterality: N/A;  . INCISIONAL HERNIA REPAIR N/A 01/15/2019   Procedure: [name] HERNIORRHAPHY WITH MESH;  Surgeon: [name], MD;  Location: [facility];  Service: General;  Laterality: N/A;  . IR GASTROSTOMY TUBE MOD SED  03/22/2020  . IR GJ TUBE CHANGE  07/24/2020  . IR RADIOLOGIST EVAL & MGMT  05/07/2020  . IR REPLACE G-TUBE SIMPLE WO FLUORO  11/30/2020  . OMENTECTOMY N/A 01/15/2019   Procedure: OMENTECTOMY;  Surgeon: [name], MD;  Location: [facility];  Service: General;  Laterality: N/A;  . POLYPECTOMY  04/12/2020   Procedure: POLYPECTOMY;  Surgeon: Wilford Corner, MD;  Location: [facility];  Service: Gastroenterology;;  . TRACHEOSTOMY TUBE PLACEMENT N/A 03/13/2020   Procedure: AWAKE TRACHEOSTOMY;  Surgeon: Jason Coop, DO;  Location: [facility];  Service: ENT;  Laterality: N/A;     OB History   No obstetric history on file.     Family History  Problem Relation Age of Onset  . Hypertension Mother   . Sudden Cardiac Death Neg Hx     Social History   Tobacco Use  . Smoking status: Every Day    Packs/day: 1.00    Types: Cigarettes  . Smokeless  tobacco: Never  Vaping Use  . Vaping Use: Never used  Substance Use Topics  . Alcohol use: No  . Drug use: No    Home Medications Prior to Admission medications   Medication Sig Start Date End Date Taking? Authorizing Provider  apixaban (ELIQUIS) 5 MG TABS tablet Take 1 tablet (5 mg total) by mouth 2 (two) times daily. Restart taking only after next Monday(8/22) 12/20/20   Shelly Coss, MD  arformoterol (BROVANA) 15 MCG/2ML NEBU Take 2 mLs (15 mcg total) by nebulization 2 (two) times daily. 04/16/20   Pokhrel, Laxman, MD  budesonide (PULMICORT) 0.5 MG/2ML nebulizer solution Take 2 mLs (0.5 mg total) by nebulization 2 (two) times daily. 04/16/20   Pokhrel, Corrie Mckusick, MD  dexamethasone (DECADRON) 4 MG tablet Place 1 tablet (4 mg total) into feeding tube 2 (two) times daily. 12/13/20   Shelly Coss, MD  diclofenac Sodium (VOLTAREN) 1 % GEL Apply 2 g topically 4 (four) times daily as needed (neck/back pain). 04/16/20   Pokhrel, Corrie Mckusick, MD  diphenhydrAMINE (BENADRYL) 12.5 MG/5ML elixir Place 5 mLs (12.5 mg total) into feeding tube every 6 (six) hours as needed for itching. 04/16/20   Pokhrel, Corrie Mckusick, MD  escitalopram (LEXAPRO) 5 MG tablet Place 1 tablet (5 mg total) into feeding tube daily. 12/13/20   Shelly Coss, MD  famotidine (PEPCID) 20 MG tablet Place 1 tablet (20 mg total) into feeding tube daily. 12/13/20 01/12/21  Shelly Coss, MD  folic acid (FOLVITE) 1 MG tablet Place 1 tablet (1 mg total) into feeding tube daily. 12/13/20   Shelly Coss, MD  furosemide (LASIX) 40 MG tablet Place 1 tablet (40 mg total) into feeding tube daily. Start taking On Thursday(12/16/20) 12/16/20   Shelly Coss, MD  gabapentin (NEURONTIN) 300 MG capsule Place 2 capsules (600 mg total) into feeding tube at bedtime. 12/13/20   Shelly Coss, MD  ipratropium-albuterol (DUONEB) 0.5-2.5 (3) MG/3ML SOLN Take 3 mLs by nebulization every 6 (six) hours as needed. Patient not taking: Reported on 11/29/2020 04/16/20  05/16/20  Flora Lipps, MD  ketotifen (ZADITOR) 0.025 % ophthalmic solution Place 1 drop into both eyes 2 (two) times daily. 04/16/20   Pokhrel, Corrie Mckusick, MD  levETIRAcetam (KEPPRA) 100 MG/ML solution Place 7.5 mLs (750 mg total) into feeding tube 2 (two) times daily. 12/13/20   Shelly Coss, MD  lidocaine (LIDODERM) 5 % Place 1 patch onto the skin daily. Remove & Discard patch within 12 hours or as directed by MD 08/09/20   Benay Pike, MD  midodrine (PROAMATINE) 5 MG tablet Place 1 tablet (5 mg total) into feeding tube 2 (two) times daily with a meal. 12/13/20   Shelly Coss, MD  Nutritional Supplements (FEEDING SUPPLEMENT, OSMOLITE 1.5 CAL,) LIQD Place 237 mLs into feeding tube 3 (three) times daily. 12/13/20   Shelly Coss, MD  ondansetron (ZOFRAN) 8 MG tablet Place 1 tablet (8 mg total) into feeding tube every 8 (eight) hours  as needed for nausea or vomiting. 12/13/20   Shelly Coss, MD  oxyCODONE (OXY IR/ROXICODONE) 5 MG immediate release tablet Take 1 tablet (5 mg total) by mouth every 6 (six) hours as needed for moderate pain. 12/13/20   Shelly Coss, MD  polyethylene glycol powder (GLYCOLAX/MIRALAX) 17 GM/SCOOP powder Dissolve 1 capful (17 g) in liquid and add into feeding tube daily as needed for moderate constipation. 12/13/20   Shelly Coss, MD  potassium chloride SA (KLOR-CON) 20 MEQ tablet Take 1 tablet (20 mEq total) by mouth daily. 06/16/20   Benay Pike, MD  prochlorperazine (COMPAZINE) 10 MG tablet Place 1 tablet (10 mg total) into feeding tube every 6 (six) hours as needed for nausea or vomiting. 12/13/20   Shelly Coss, MD  scopolamine (TRANSDERM-SCOP) 1 MG/3DAYS Place 1 patch (1.5 mg total) onto the skin every 3 (three) days. Patient not taking: Reported on 11/29/2020 04/18/20   Flora Lipps, MD  vitamin B-12 (CYANOCOBALAMIN) 1000 MCG tablet Place 0.5 tablets (500 mcg total) into feeding tube daily. 12/13/20   Shelly Coss, MD    Allergies    Codeine and  Penicillins  Review of Systems   Review of Systems  All other systems reviewed and are negative.  Physical Exam Updated Vital Signs BP 111/64 (BP Location: Left Arm)   Pulse (!) 47   Temp 97.9 F (36.6 C) (Oral)   Resp 15   SpO2 92%   Physical Exam Vitals and nursing note reviewed.  Constitutional:      General: She is not in acute distress.    Appearance: Normal appearance.  HENT:     Nose: Nose normal.     Mouth/Throat:     Mouth: Mucous membranes are moist.  Eyes:     Extraocular Movements: Extraocular movements intact.     Pupils: Pupils are equal, round, and reactive to light.  Neck:     Comments: Lurline Idol present without significant discharge Cardiovascular:     Rate and Rhythm: Bradycardia present.  Pulmonary:     Effort: Pulmonary effort is normal.  Abdominal:     General: Bowel sounds are normal.     Palpations: Abdomen is soft.     Tenderness: There is no abdominal tenderness.     Comments: G-tube insertion site present in the left abdomen.  No drainage or erythema.  Musculoskeletal:     Right lower leg: No edema.     Left lower leg: No edema.  Skin:    General: Skin is warm and dry.  Neurological:     Mental Status: She is alert. Mental status is at baseline.  Psychiatric:        Mood and Affect: Mood normal.        Behavior: Behavior normal.    ED Results / Procedures / Treatments   Labs (all labs ordered are listed, but only abnormal results are displayed) Labs Reviewed - No data to display  EKG None  Radiology No results found.  Procedures Gastrostomy tube replacement  Date/Time: 01/07/2021 4:41 PM Performed by: Blanchie Dessert, MD Authorized by: Blanchie Dessert, MD  Consent: Verbal consent obtained. Consent given by: patient Patient understanding: patient states understanding of the procedure being performed Patient identity confirmed: verbally with patient Local anesthesia used: no  Anesthesia: Local anesthesia used:  no  Sedation: Patient sedated: no  Patient tolerance: patient tolerated the procedure well with no immediate complications Comments: Initially had to dilate tract with a 8f and then 51f foley but then was able to  place the 34f g-tube back in place      Medications Ordered in ED Medications  sterile water (preservative free) injection (  Not Given 01/07/21 1614)  diatrizoate meglumine-sodium (GASTROGRAFIN) 66-10 % solution 20 mL (has no administration in time range)    ED Course  I have reviewed the triage vital signs and the nursing notes.  Pertinent labs & imaging results that were available during my care of the patient were reviewed by me and considered in my medical decision making (see chart for details).    MDM Rules/Calculators/A&P                           Patient presenting today because her G-tube have been pulled out.  She has no other complaints at this time.  Is been out approximately 4 hours and initially unable to pass the 16 [place].  Dilated the tract with a 12 French Foley catheter then a 14 and was then able to pass a 16 [place] G-tube.  No complications noted.  Placement  to be confirmed with a KUB.   MDM   Amount and/or Complexity of Data Reviewed Tests in the radiology section of CPT: ordered and reviewed Independent visualization of images, tracings, or specimens: yes    Final Clinical Impression(s) / ED Diagnoses Final diagnoses:  Complication of gastrostomy tube [facility])    Rx / DC Orders ED Discharge Orders     None        Blanchie Dessert, MD 01/07/21 1711

## 2021-01-12 ENCOUNTER — Other Ambulatory Visit (HOSPITAL_COMMUNITY): Payer: Self-pay | Admitting: Nephrology

## 2021-01-12 DIAGNOSIS — D638 Anemia in other chronic diseases classified elsewhere: Secondary | ICD-10-CM

## 2021-01-12 DIAGNOSIS — C321 Malignant neoplasm of supraglottis: Secondary | ICD-10-CM

## 2021-01-12 DIAGNOSIS — Z931 Gastrostomy status: Secondary | ICD-10-CM

## 2021-01-12 DIAGNOSIS — N184 Chronic kidney disease, stage 4 (severe): Secondary | ICD-10-CM

## 2021-01-12 DIAGNOSIS — E6609 Other obesity due to excess calories: Secondary | ICD-10-CM

## 2021-01-12 DIAGNOSIS — I129 Hypertensive chronic kidney disease with stage 1 through stage 4 chronic kidney disease, or unspecified chronic kidney disease: Secondary | ICD-10-CM

## 2021-01-12 DIAGNOSIS — Z930 Tracheostomy status: Secondary | ICD-10-CM

## 2021-01-20 ENCOUNTER — Ambulatory Visit (HOSPITAL_COMMUNITY)
Admission: RE | Admit: 2021-01-20 | Discharge: 2021-01-20 | Disposition: A | Discharge status: Home / Self Care | Payer: Medicaid Other | Source: Ambulatory Visit | Attending: Nephrology | Admitting: Nephrology

## 2021-01-20 ENCOUNTER — Other Ambulatory Visit: Payer: Self-pay

## 2021-01-20 DIAGNOSIS — C321 Malignant neoplasm of supraglottis: Secondary | ICD-10-CM | POA: Diagnosis present

## 2021-01-20 DIAGNOSIS — D638 Anemia in other chronic diseases classified elsewhere: Secondary | ICD-10-CM | POA: Diagnosis present

## 2021-01-20 DIAGNOSIS — I129 Hypertensive chronic kidney disease with stage 1 through stage 4 chronic kidney disease, or unspecified chronic kidney disease: Secondary | ICD-10-CM | POA: Insufficient documentation

## 2021-01-20 DIAGNOSIS — Z931 Gastrostomy status: Secondary | ICD-10-CM | POA: Diagnosis present

## 2021-01-20 DIAGNOSIS — N184 Chronic kidney disease, stage 4 (severe): Secondary | ICD-10-CM | POA: Diagnosis not present

## 2021-01-20 DIAGNOSIS — E6609 Other obesity due to excess calories: Secondary | ICD-10-CM | POA: Diagnosis present

## 2021-01-20 DIAGNOSIS — Z930 Tracheostomy status: Secondary | ICD-10-CM | POA: Diagnosis present

## 2021-01-25 ENCOUNTER — Other Ambulatory Visit (HOSPITAL_COMMUNITY): Payer: Self-pay | Admitting: Hematology and Oncology

## 2021-02-03 ENCOUNTER — Telehealth: Payer: Self-pay | Admitting: Acute Care

## 2021-02-03 DIAGNOSIS — Z930 Tracheostomy status: Secondary | ICD-10-CM

## 2021-02-03 NOTE — Telephone Encounter (Signed)
Staff message received: Message Received: Joycelyn Rua, Ottis Stain, NP  P Lbpu Triage [facility] team can you send this to [facility]   Need the following trach care supplies   Trach care kits X 30 refill 12  Trach sponges 1 mo supply refill as needed  12 french suction catheters # 30 refill 12    Order has been placed. Nothing further needed.

## 2021-02-11 ENCOUNTER — Telehealth: Payer: Self-pay

## 2021-02-11 DIAGNOSIS — J9602 Acute respiratory failure with hypercapnia: Secondary | ICD-10-CM

## 2021-02-11 DIAGNOSIS — Z930 Tracheostomy status: Secondary | ICD-10-CM

## 2021-02-11 NOTE — Telephone Encounter (Signed)
-----   Message from Erick Colace, NP sent at 02/09/2021 12:56 PM EDT ----- More DME   Please order the following trach:  Shiley , size 4, and uncuffed  Backup trach  portex and size 4 cuffless  Supplies   tube holder/collar. Quantity: 5 refills12  and Suction catheters. size:12  Quantity30 refills12   Medication instructions.

## 2021-02-11 NOTE — Telephone Encounter (Signed)
Order placed. Nothing further needed at this time. 

## 2021-02-17 ENCOUNTER — Telehealth: Payer: Self-pay | Admitting: Dietician

## 2021-02-17 ENCOUNTER — Encounter (HOSPITAL_COMMUNITY): Payer: Medicaid Other | Admitting: Dietician

## 2021-02-17 NOTE — Telephone Encounter (Signed)
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 She is s/p PEG replacement on 9/9 after it was accidentally pulled out.   Spoke with sister via telephone. She reports patient has a good appetite, eating orally 3 times/day. Sister reports she eats "pretty much what she wants to except for bread" She is drinking at least one Ensure by mouth. Patient is giving 2 cartons Osmolite 1.5 via tube daily. Sister reports patient is undecided about salvage laryngectomy, states she has follow-up with nephrologist tomorrow and hoping to make a decision after.    Medications: reviewed  Labs: 9/2 - Glucose 63, BUN 34, Cr 2.0  Anthropometrics: Per care everywhere patient weighed 205 lb on 10/5 at PCP decreased from 219 lb on 9/14 at outpt nephrologist. This indicates (6.4%) 14 lb weight loss in 3 weeks which is significant for time frame.  8/15 - 234 lb 3.2 oz 4/11 - 229 lb 11.2 oz  Estimated Energy Needs  Kcals: 2070-2275 Protein: 100-118 Fluid: 2.1 L  NUTRITION DIAGNOSIS: Inadequate oral intake appears improved, pt supplementing with tube feedings   INTERVENTION:  Continue eating orally as tolerated Continue giving 2 cartons Osmolite 1.5 via tube daily with 60 ml water flush before and after each feeding Continue drinking 1-2 Ensure Plus/equivalent as needed for added calories and protein     MONITORING, EVALUATION, GOAL: weight trends, intake    NEXT VISIT: via telephone ~6 weeks

## 2021-02-24 ENCOUNTER — Telehealth: Payer: Self-pay

## 2021-02-24 NOTE — Telephone Encounter (Signed)
Dr. Theador Hawthorne called from [facility] in [place] regarding Veronica Horton who he is treating for chronic kidney disease.  He noted that her hemoglobin is 8.5 and he would normally start epogen.  Consulted with Dr. Lorenso Courier since Dr. Chryl Heck is on maternity leave and he said due to her not getting chemotherapy or radiation treatment, he would approve the use of epogen as a symptomatic and palliative relief.  If she were to pursue chemotherapy, we would need to reevaluate.  Dr. Theador Hawthorne is going to reach out to [facility] to see if she can get her epogen there.  Routing to appropriate individuals. Gardiner Rhyme, RN

## 2021-03-08 ENCOUNTER — Telehealth: Payer: Self-pay | Admitting: *Deleted

## 2021-03-08 NOTE — Telephone Encounter (Signed)
CALLED PATIENT TO INFORM OF LABS AND CT FOR 03-16-21- ARRIVAL TIME- 2:15 PM @ [facility], PATIENT TO HAVE WATER ONLY- 4 HRS. PRIOR TO TEST, PATIENT TO RECEIVE CT RESULTS FROM DR. SQUIRE ON 03-18-21 @ 11:20 AM, SPOKE WITH  PATIENT'S RELATIVE [name] JOHNSON AND SHE IS AWARE OF THESE APPTS.

## 2021-03-09 ENCOUNTER — Ambulatory Visit: Payer: Medicaid Other | Admitting: Radiation Oncology

## 2021-03-16 ENCOUNTER — Other Ambulatory Visit: Payer: Self-pay

## 2021-03-16 ENCOUNTER — Ambulatory Visit (HOSPITAL_COMMUNITY): Payer: Medicaid Other

## 2021-03-16 ENCOUNTER — Encounter (HOSPITAL_COMMUNITY)
Admission: RE | Admit: 2021-03-16 | Discharge: 2021-03-16 | Disposition: A | Discharge status: Home / Self Care | Payer: Medicaid Other | Source: Ambulatory Visit | Attending: Nephrology | Admitting: Nephrology

## 2021-03-16 DIAGNOSIS — D631 Anemia in chronic kidney disease: Secondary | ICD-10-CM | POA: Diagnosis not present

## 2021-03-16 DIAGNOSIS — N1832 Chronic kidney disease, stage 3b: Secondary | ICD-10-CM | POA: Insufficient documentation

## 2021-03-16 LAB — POCT HEMOGLOBIN-HEMACUE: Hemoglobin: 12.8 g/dL (ref 12.0–15.0)

## 2021-03-16 MED ORDER — EPOETIN ALFA-EPBX 3000 UNIT/ML IJ SOLN: 3000.0000 [IU] | Freq: Once | INTRAMUSCULAR | Status: DC

## 2021-03-17 ENCOUNTER — Telehealth: Payer: Self-pay | Admitting: *Deleted

## 2021-03-17 NOTE — Telephone Encounter (Signed)
CALLED PATIENT'S SISTER- [name] JOHNSON AND INFORMED OF CT @ [facility] ON 03-30-21 AND HER FU WITH DR. Isidore Moos FOR RESULTS ON 04-01-21 @ 11:20 AM, SPOKE WITH PATIENT'S SISTER- [name] JOHNSON AND SHE VERIFIED UNDERSTANDING THESE APPTS.

## 2021-03-17 NOTE — Telephone Encounter (Signed)
XXXXX

## 2021-03-18 ENCOUNTER — Inpatient Hospital Stay: Payer: Medicaid Other | Admitting: Nurse Practitioner

## 2021-03-18 ENCOUNTER — Ambulatory Visit: Payer: Self-pay | Admitting: Radiation Oncology

## 2021-03-29 ENCOUNTER — Other Ambulatory Visit: Payer: Self-pay

## 2021-03-29 ENCOUNTER — Telehealth: Payer: Self-pay | Admitting: *Deleted

## 2021-03-29 ENCOUNTER — Ambulatory Visit (HOSPITAL_COMMUNITY)
Admission: RE | Admit: 2021-03-29 | Discharge: 2021-03-29 | Disposition: A | Discharge status: Home / Self Care | Payer: Medicaid Other | Source: Ambulatory Visit | Attending: Acute Care | Admitting: Acute Care

## 2021-03-29 DIAGNOSIS — Z923 Personal history of irradiation: Secondary | ICD-10-CM | POA: Insufficient documentation

## 2021-03-29 DIAGNOSIS — J449 Chronic obstructive pulmonary disease, unspecified: Secondary | ICD-10-CM | POA: Insufficient documentation

## 2021-03-29 DIAGNOSIS — Z430 Encounter for attention to tracheostomy: Secondary | ICD-10-CM | POA: Insufficient documentation

## 2021-03-29 DIAGNOSIS — J9610 Chronic respiratory failure, unspecified whether with hypoxia or hypercapnia: Secondary | ICD-10-CM | POA: Insufficient documentation

## 2021-03-29 DIAGNOSIS — Z930 Tracheostomy status: Secondary | ICD-10-CM

## 2021-03-29 DIAGNOSIS — Z8521 Personal history of malignant neoplasm of larynx: Secondary | ICD-10-CM | POA: Insufficient documentation

## 2021-03-29 NOTE — Progress Notes (Signed)
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  She is status post trach and PEG.  Now status post radiation therapy.  I follow her for tracheostomy management she presents today for planned tracheostomy change since our last visit which has been sometime ago due to missed appointments she did have her tracheostomy dislodged, she was seen in outside hospital and they were only able to place a size 4.  She presents today for planned trach change   Review of Systems  Constitutional: Negative.   HENT:  Positive for congestion.   Eyes: Negative.   Respiratory:  Positive for cough and sputum production.   Cardiovascular: Negative.   Gastrointestinal: Negative.   Genitourinary: Negative.   Musculoskeletal: Negative.   Skin: Negative.   Neurological: Negative.   Endo/Heme/Allergies: Negative.   Psychiatric/Behavioral: Negative.     Exam Pulse oximetry mid 90s on humidified trach collar  General chronically ill-appearing 60 year old female she is wheelchair dependent HEENT size 4 tracheostomy cuffless tracheostomy site unremarkable.  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  I do need to see her more frequently she had significant biofilm buildup on her tracheostomy, I think this may be  contributing to some of her cough and airway irritation  Plan Continue routine trach care R OV 12 weeks  15 min  Erick Colace ACNP-BC [place] Pager # [phone] OR # [phone] if no answer

## 2021-03-29 NOTE — Progress Notes (Addendum)
Tracheostomy Procedure Note  Veronica Horton [id number] 07/17/1960  Pre Procedure Tracheostomy Information  Trach Brand: Shiley Flex Size:  4.0 Style: Uncuffed Secured by: Velcro   Procedure: Trach change and trach Cleaning    Post Procedure Tracheostomy Information  Trach Brand: Shiley Flex Size:  4.0 Style: Uncuffed Secured by: Velcro   Post Procedure Evaluation:  ETCO2 positive color change from yellow to purple : Yes.   Vital signs:VSS Patients current condition: stable Complications: No apparent complications Trach site exam: clean, dry Wound care done: 4 x 4 gauze Patient did tolerate procedure well.   Education: None  Prescription needs: Home Health trach supplies ordered by NP    Additional needs: PMV and an additional trach given to patient at this visit

## 2021-03-29 NOTE — Telephone Encounter (Signed)
CALLED PATIENT'S SISTER- [name] JOHNSON TO INFORM THAT HER  SISTER'S CT HAS BEEN CANCELLED DUE TO HER SISTER'S INSURANCE, SPOKE WITH MS. JOHNSON AND SHE VERIFIED UNDERSTANDING THAT THIS SCAN HAS BEEN CANCELLED

## 2021-03-29 NOTE — Telephone Encounter (Signed)
XXXX 

## 2021-03-30 ENCOUNTER — Encounter (HOSPITAL_COMMUNITY): Payer: Self-pay

## 2021-03-30 ENCOUNTER — Ambulatory Visit (HOSPITAL_COMMUNITY): Payer: Medicaid Other

## 2021-03-30 ENCOUNTER — Encounter (HOSPITAL_COMMUNITY)
Admission: RE | Admit: 2021-03-30 | Discharge: 2021-03-30 | Disposition: A | Discharge status: Home / Self Care | Payer: Medicaid Other | Source: Ambulatory Visit | Attending: Nephrology | Admitting: Nephrology

## 2021-03-30 DIAGNOSIS — N1832 Chronic kidney disease, stage 3b: Secondary | ICD-10-CM | POA: Diagnosis not present

## 2021-03-30 LAB — POCT HEMOGLOBIN-HEMACUE: Hemoglobin: 11.5 g/dL — ABNORMAL LOW (ref 12.0–15.0)

## 2021-03-30 MED ORDER — EPOETIN ALFA-EPBX 3000 UNIT/ML IJ SOLN: 3000.0000 [IU] | Freq: Once | INTRAMUSCULAR | Status: DC

## 2021-04-01 ENCOUNTER — Encounter: Payer: Self-pay | Admitting: Radiation Oncology

## 2021-04-01 ENCOUNTER — Ambulatory Visit: Payer: Medicaid Other | Admitting: Radiation Oncology

## 2021-04-01 ENCOUNTER — Inpatient Hospital Stay: Payer: Medicaid Other | Admitting: Nurse Practitioner

## 2021-04-01 NOTE — Progress Notes (Signed)
Patient's sister [name] called to say patient's insurance company informed her that patient's CT scan is still not authorized.   As it stands now, patient won't be coming for visit this week if scan doesn't get authorized/performed (so Palliative Care visit will also need to be rescheduled if things don't get squared away by the end of the week)    Staff is working on getting authorization. -----------------------------------  Eppie Gibson, MD

## 2021-04-13 ENCOUNTER — Encounter (HOSPITAL_COMMUNITY)
Admission: RE | Admit: 2021-04-13 | Discharge: 2021-04-13 | Disposition: A | Discharge status: Home / Self Care | Payer: Medicaid Other | Source: Ambulatory Visit | Attending: Nephrology | Admitting: Nephrology

## 2021-04-13 ENCOUNTER — Other Ambulatory Visit: Payer: Self-pay

## 2021-04-13 DIAGNOSIS — D631 Anemia in chronic kidney disease: Secondary | ICD-10-CM | POA: Insufficient documentation

## 2021-04-13 DIAGNOSIS — N1832 Chronic kidney disease, stage 3b: Secondary | ICD-10-CM | POA: Diagnosis not present

## 2021-04-13 LAB — POCT HEMOGLOBIN-HEMACUE: Hemoglobin: 12.2 g/dL (ref 12.0–15.0)

## 2021-04-13 MED ORDER — EPOETIN ALFA-EPBX 3000 UNIT/ML IJ SOLN: 3000.0000 [IU] | Freq: Once | INTRAMUSCULAR | Status: DC

## 2021-04-14 ENCOUNTER — Encounter (HOSPITAL_COMMUNITY): Payer: Medicaid Other | Admitting: Dietician

## 2021-04-14 ENCOUNTER — Telehealth (HOSPITAL_COMMUNITY): Payer: Self-pay | Admitting: Dietician

## 2021-04-14 NOTE — Telephone Encounter (Signed)
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 She is s/p PEG replacement on 9/9 after it was accidentally pulled out.   Spoke with [name] (pt sister), reports pt has a good appetite and eating "anything she can get her hands own" orally. She is drinking 1-2 Ensure/day. Patient continues giving 2 cartons of Osmolite 1.5 via PEG Denies nausea, vomiting, diarrhea, constipation.   Medications: reviewed  Labs: no new labs for review   Anthropometrics: Last weight 234 lb 2.1 oz on 11/30   10/27 - 224 lb (per outpatient nephrology office visit) 8/15 - 234 lb 3.2 oz  4/11 - 229 lb 11.2 oz    NUTRITION DIAGNOSIS: Inadequate oral intake improved, pt supplementing with tube feedings    INTERVENTION:  Continue eating a variety of foods orally  Continue 2 cartons Osmolite 1.5 via PEG with 60 ml water before and after each feeding Continue drinking Ensure Plus/equivalent as needed with decreased oral intake - will mail coupons  Patient has contact information     MONITORING, EVALUATION, GOAL: weight trends, intake   NEXT VISIT: via telephone ~6 weeks

## 2021-04-27 ENCOUNTER — Encounter (HOSPITAL_COMMUNITY)
Admission: RE | Admit: 2021-04-27 | Discharge: 2021-04-27 | Disposition: A | Discharge status: Home / Self Care | Payer: Medicaid Other | Source: Ambulatory Visit | Attending: Nephrology | Admitting: Nephrology

## 2021-04-27 ENCOUNTER — Other Ambulatory Visit: Payer: Self-pay

## 2021-04-27 ENCOUNTER — Encounter (HOSPITAL_COMMUNITY): Payer: Self-pay

## 2021-04-27 DIAGNOSIS — N1832 Chronic kidney disease, stage 3b: Secondary | ICD-10-CM | POA: Diagnosis not present

## 2021-04-27 LAB — POCT HEMOGLOBIN-HEMACUE: Hemoglobin: 12.2 g/dL (ref 12.0–15.0)

## 2021-04-27 MED ORDER — EPOETIN ALFA-EPBX 3000 UNIT/ML IJ SOLN: 3000.0000 [IU] | Freq: Once | INTRAMUSCULAR | Status: DC

## 2021-05-11 ENCOUNTER — Encounter (HOSPITAL_COMMUNITY)
Admission: RE | Admit: 2021-05-11 | Discharge: 2021-05-11 | Disposition: A | Discharge status: Home / Self Care | Payer: Medicaid Other | Source: Ambulatory Visit | Attending: Nephrology | Admitting: Nephrology

## 2021-05-11 ENCOUNTER — Encounter (HOSPITAL_COMMUNITY): Payer: Self-pay

## 2021-05-11 DIAGNOSIS — N1832 Chronic kidney disease, stage 3b: Secondary | ICD-10-CM | POA: Diagnosis present

## 2021-05-11 DIAGNOSIS — D631 Anemia in chronic kidney disease: Secondary | ICD-10-CM | POA: Diagnosis not present

## 2021-05-11 LAB — POCT HEMOGLOBIN-HEMACUE: Hemoglobin: 12.3 g/dL (ref 12.0–15.0)

## 2021-05-11 MED ORDER — EPOETIN ALFA-EPBX 3000 UNIT/ML IJ SOLN: 3000.0000 [IU] | Freq: Once | INTRAMUSCULAR | Status: DC

## 2021-05-25 ENCOUNTER — Encounter (HOSPITAL_COMMUNITY)
Admission: RE | Admit: 2021-05-25 | Discharge: 2021-05-25 | Disposition: A | Discharge status: Home / Self Care | Payer: Medicaid Other | Source: Ambulatory Visit | Attending: Nephrology | Admitting: Nephrology

## 2021-06-07 ENCOUNTER — Encounter (HOSPITAL_COMMUNITY)
Admission: RE | Admit: 2021-06-07 | Discharge: 2021-06-07 | Disposition: A | Discharge status: Home / Self Care | Payer: Medicaid Other | Source: Ambulatory Visit | Attending: Nephrology | Admitting: Nephrology

## 2021-06-07 ENCOUNTER — Encounter (HOSPITAL_COMMUNITY): Payer: Self-pay

## 2021-06-07 DIAGNOSIS — D631 Anemia in chronic kidney disease: Secondary | ICD-10-CM | POA: Insufficient documentation

## 2021-06-07 DIAGNOSIS — N1832 Chronic kidney disease, stage 3b: Secondary | ICD-10-CM | POA: Insufficient documentation

## 2021-06-07 LAB — POCT HEMOGLOBIN-HEMACUE: Hemoglobin: 12.4 g/dL (ref 12.0–15.0)

## 2021-06-07 MED ORDER — EPOETIN ALFA-EPBX 3000 UNIT/ML IJ SOLN: 3000.0000 [IU] | Freq: Once | INTRAMUSCULAR | Status: DC

## 2021-06-21 ENCOUNTER — Encounter (HOSPITAL_COMMUNITY): Payer: Self-pay

## 2021-06-21 ENCOUNTER — Encounter (HOSPITAL_COMMUNITY)
Admission: RE | Admit: 2021-06-21 | Discharge: 2021-06-21 | Disposition: A | Discharge status: Home / Self Care | Payer: Medicaid Other | Source: Ambulatory Visit | Attending: Nephrology | Admitting: Nephrology

## 2021-06-21 DIAGNOSIS — N1832 Chronic kidney disease, stage 3b: Secondary | ICD-10-CM | POA: Diagnosis not present

## 2021-06-21 LAB — POCT HEMOGLOBIN-HEMACUE: Hemoglobin: 13.2 g/dL (ref 12.0–15.0)

## 2021-06-21 MED ORDER — EPOETIN ALFA-EPBX 3000 UNIT/ML IJ SOLN: 3000.0000 [IU] | Freq: Once | INTRAMUSCULAR | Status: DC

## 2021-07-05 ENCOUNTER — Encounter (HOSPITAL_COMMUNITY)
Admission: RE | Admit: 2021-07-05 | Discharge: 2021-07-05 | Disposition: A | Discharge status: Home / Self Care | Payer: Medicaid Other | Source: Ambulatory Visit | Attending: Nephrology | Admitting: Nephrology

## 2021-07-05 DIAGNOSIS — N1832 Chronic kidney disease, stage 3b: Secondary | ICD-10-CM | POA: Insufficient documentation

## 2021-07-05 DIAGNOSIS — D631 Anemia in chronic kidney disease: Secondary | ICD-10-CM | POA: Insufficient documentation

## 2021-07-05 LAB — POCT HEMOGLOBIN-HEMACUE: Hemoglobin: 12.8 g/dL (ref 12.0–15.0)

## 2021-07-05 MED ORDER — EPOETIN ALFA-EPBX 3000 UNIT/ML IJ SOLN: 3000.0000 [IU] | Freq: Once | INTRAMUSCULAR | Status: DC

## 2021-07-13 ENCOUNTER — Inpatient Hospital Stay (HOSPITAL_COMMUNITY): Admission: RE | Admit: 2021-07-13 | Payer: Medicaid Other | Source: Ambulatory Visit

## 2021-07-19 ENCOUNTER — Encounter (HOSPITAL_COMMUNITY): Payer: Medicaid Other

## 2021-07-26 ENCOUNTER — Encounter (HOSPITAL_COMMUNITY)
Admission: RE | Admit: 2021-07-26 | Discharge: 2021-07-26 | Disposition: A | Discharge status: Home / Self Care | Payer: Medicaid Other | Source: Ambulatory Visit | Attending: Nephrology | Admitting: Nephrology

## 2021-07-26 ENCOUNTER — Encounter (HOSPITAL_COMMUNITY): Payer: Self-pay

## 2021-07-26 DIAGNOSIS — N1832 Chronic kidney disease, stage 3b: Secondary | ICD-10-CM | POA: Diagnosis not present

## 2021-07-26 LAB — POCT HEMOGLOBIN-HEMACUE: Hemoglobin: 12.3 g/dL (ref 12.0–15.0)

## 2021-07-26 MED ORDER — EPOETIN ALFA-EPBX 3000 UNIT/ML IJ SOLN: 3000.0000 [IU] | Freq: Once | INTRAMUSCULAR | Status: DC

## 2021-08-01 ENCOUNTER — Other Ambulatory Visit: Payer: Self-pay

## 2021-08-01 ENCOUNTER — Telehealth: Payer: Self-pay | Admitting: *Deleted

## 2021-08-01 DIAGNOSIS — C321 Malignant neoplasm of supraglottis: Secondary | ICD-10-CM

## 2021-08-01 NOTE — Telephone Encounter (Signed)
Called patient to inform of lab and CT on 08-10-21- arrival time- 8 am @ [facility], patient to have water only- 4 hrs. prior,  to test, spoke with patient's sister- [name] and she declined and wants to speak to her sister and get back with me later ?

## 2021-08-02 ENCOUNTER — Telehealth: Payer: Self-pay | Admitting: Hematology and Oncology

## 2021-08-02 ENCOUNTER — Telehealth: Payer: Self-pay | Admitting: *Deleted

## 2021-08-02 NOTE — Telephone Encounter (Signed)
CALLED PATIENT TO INFORM OF LAB AND CT ON 08-19-21- ARRIVAL TIME- 1:30 PM @ [facility], PATIENT TO HAVE WATER ONLY- 4 HRS. PRIOR TO TEST, PATIENT TO RECEIVE RESULTS FROM DR. SQUIRE ON 08-23-21 @ 2:40 PM FOR RESULTS, SPOKE WITH PATIENT'S RELATIVE [name] JOHNSON AND SHE IS AWARE OF THESE APPTS. ?

## 2021-08-02 NOTE — Telephone Encounter (Signed)
Scheduled appointment per provider. Patient is aware. 

## 2021-08-02 NOTE — Telephone Encounter (Signed)
RETURNED PATIENT'S SISTER ([place] PHONE CALL), LVM FOR A RETURN CALL ?

## 2021-08-05 ENCOUNTER — Telehealth: Payer: Self-pay

## 2021-08-05 NOTE — Telephone Encounter (Signed)
Our office received a referral for Veronica Horton. I called her sister, [name], to explain our role on her team and get her scheduled for an initial visit. I told her that we could see her after her appt with Dr. Isidore Moos on 4/25. She stated they also had an appt with Dr. Chryl Heck a few days after and asked if they could switch this to 4/25 as well, so they wouldn't have to drive back. Scheduling message sent. Patient scheduled to see Dr. Isidore Moos, our office, and Dr. Chryl Heck on 4/25.  ?

## 2021-08-08 ENCOUNTER — Encounter (HOSPITAL_COMMUNITY): Payer: Self-pay | Admitting: Emergency Medicine

## 2021-08-08 ENCOUNTER — Inpatient Hospital Stay (HOSPITAL_COMMUNITY)
Admission: EM | Admit: 2021-08-08 | Discharge: 2021-08-13 | DRG: 603 | Disposition: A | Discharge status: Home Health | Payer: Medicaid Other | Attending: Family Medicine | Admitting: Family Medicine

## 2021-08-08 ENCOUNTER — Other Ambulatory Visit: Payer: Self-pay

## 2021-08-08 ENCOUNTER — Emergency Department (HOSPITAL_COMMUNITY): Payer: Medicaid Other

## 2021-08-08 DIAGNOSIS — Z720 Tobacco use: Secondary | ICD-10-CM | POA: Diagnosis present

## 2021-08-08 DIAGNOSIS — L0390 Cellulitis, unspecified: Secondary | ICD-10-CM | POA: Diagnosis not present

## 2021-08-08 DIAGNOSIS — N1832 Chronic kidney disease, stage 3b: Secondary | ICD-10-CM | POA: Diagnosis present

## 2021-08-08 DIAGNOSIS — Z930 Tracheostomy status: Secondary | ICD-10-CM

## 2021-08-08 DIAGNOSIS — G40909 Epilepsy, unspecified, not intractable, without status epilepticus: Secondary | ICD-10-CM | POA: Diagnosis present

## 2021-08-08 DIAGNOSIS — A419 Sepsis, unspecified organism: Secondary | ICD-10-CM | POA: Diagnosis not present

## 2021-08-08 DIAGNOSIS — J387 Other diseases of larynx: Secondary | ICD-10-CM | POA: Diagnosis present

## 2021-08-08 DIAGNOSIS — J449 Chronic obstructive pulmonary disease, unspecified: Secondary | ICD-10-CM | POA: Diagnosis present

## 2021-08-08 DIAGNOSIS — D6489 Other specified anemias: Secondary | ICD-10-CM | POA: Diagnosis present

## 2021-08-08 DIAGNOSIS — I878 Other specified disorders of veins: Secondary | ICD-10-CM | POA: Diagnosis present

## 2021-08-08 DIAGNOSIS — Z9221 Personal history of antineoplastic chemotherapy: Secondary | ICD-10-CM | POA: Diagnosis not present

## 2021-08-08 DIAGNOSIS — C329 Malignant neoplasm of larynx, unspecified: Secondary | ICD-10-CM | POA: Diagnosis present

## 2021-08-08 DIAGNOSIS — I10 Essential (primary) hypertension: Secondary | ICD-10-CM | POA: Diagnosis present

## 2021-08-08 DIAGNOSIS — I959 Hypotension, unspecified: Secondary | ICD-10-CM | POA: Diagnosis present

## 2021-08-08 DIAGNOSIS — Z923 Personal history of irradiation: Secondary | ICD-10-CM

## 2021-08-08 DIAGNOSIS — L03115 Cellulitis of right lower limb: Secondary | ICD-10-CM | POA: Diagnosis present

## 2021-08-08 DIAGNOSIS — J9610 Chronic respiratory failure, unspecified whether with hypoxia or hypercapnia: Secondary | ICD-10-CM | POA: Diagnosis present

## 2021-08-08 DIAGNOSIS — Z9981 Dependence on supplemental oxygen: Secondary | ICD-10-CM | POA: Diagnosis not present

## 2021-08-08 DIAGNOSIS — Z7901 Long term (current) use of anticoagulants: Secondary | ICD-10-CM

## 2021-08-08 DIAGNOSIS — Z86011 Personal history of benign neoplasm of the brain: Secondary | ICD-10-CM

## 2021-08-08 DIAGNOSIS — Z79899 Other long term (current) drug therapy: Secondary | ICD-10-CM

## 2021-08-08 DIAGNOSIS — J9611 Chronic respiratory failure with hypoxia: Secondary | ICD-10-CM | POA: Diagnosis present

## 2021-08-08 DIAGNOSIS — F17210 Nicotine dependence, cigarettes, uncomplicated: Secondary | ICD-10-CM | POA: Diagnosis present

## 2021-08-08 DIAGNOSIS — Z7951 Long term (current) use of inhaled steroids: Secondary | ICD-10-CM | POA: Diagnosis not present

## 2021-08-08 DIAGNOSIS — Z8589 Personal history of malignant neoplasm of other organs and systems: Secondary | ICD-10-CM

## 2021-08-08 DIAGNOSIS — Z8249 Family history of ischemic heart disease and other diseases of the circulatory system: Secondary | ICD-10-CM

## 2021-08-08 DIAGNOSIS — L03116 Cellulitis of left lower limb: Secondary | ICD-10-CM | POA: Diagnosis present

## 2021-08-08 DIAGNOSIS — Z6841 Body Mass Index (BMI) 40.0 and over, adult: Secondary | ICD-10-CM | POA: Diagnosis not present

## 2021-08-08 DIAGNOSIS — Z86718 Personal history of other venous thrombosis and embolism: Secondary | ICD-10-CM | POA: Diagnosis not present

## 2021-08-08 DIAGNOSIS — I872 Venous insufficiency (chronic) (peripheral): Secondary | ICD-10-CM | POA: Diagnosis present

## 2021-08-08 DIAGNOSIS — L03119 Cellulitis of unspecified part of limb: Principal | ICD-10-CM

## 2021-08-08 DIAGNOSIS — Z993 Dependence on wheelchair: Secondary | ICD-10-CM | POA: Diagnosis not present

## 2021-08-08 DIAGNOSIS — I82409 Acute embolism and thrombosis of unspecified deep veins of unspecified lower extremity: Secondary | ICD-10-CM

## 2021-08-08 HISTORY — DX: Acute embolism and thrombosis of unspecified deep veins of unspecified lower extremity: I82.409

## 2021-08-08 LAB — BLOOD GAS, ARTERIAL
Acid-Base Excess: 4.9 mmol/L — ABNORMAL HIGH (ref 0.0–2.0)
Bicarbonate: 29.2 mmol/L — ABNORMAL HIGH (ref 20.0–28.0)
Drawn by: 41977
FIO2: 28 %
O2 Saturation: 96.5 %
Patient temperature: 37
pCO2 arterial: 41 mmHg (ref 32–48)
pH, Arterial: 7.46 — ABNORMAL HIGH (ref 7.35–7.45)
pO2, Arterial: 69 mmHg — ABNORMAL LOW (ref 83–108)

## 2021-08-08 LAB — COMPREHENSIVE METABOLIC PANEL
ALT: 25 U/L (ref 0–44)
AST: 26 U/L (ref 15–41)
Albumin: 2.9 g/dL — ABNORMAL LOW (ref 3.5–5.0)
Alkaline Phosphatase: 111 U/L (ref 38–126)
Anion gap: 12 (ref 5–15)
BUN: 75 mg/dL — ABNORMAL HIGH (ref 8–23)
CO2: 25 mmol/L (ref 22–32)
Calcium: 8.9 mg/dL (ref 8.9–10.3)
Chloride: 98 mmol/L (ref 98–111)
Creatinine, Ser: 2.56 mg/dL — ABNORMAL HIGH (ref 0.44–1.00)
GFR, Estimated: 21 mL/min — ABNORMAL LOW (ref >60)
Glucose, Bld: 104 mg/dL — ABNORMAL HIGH (ref 70–99)
Potassium: 4 mmol/L (ref 3.5–5.1)
Sodium: 135 mmol/L (ref 135–145)
Total Bilirubin: 0.9 mg/dL (ref 0.3–1.2)
Total Protein: 6.8 g/dL (ref 6.5–8.1)

## 2021-08-08 LAB — CBC WITH DIFFERENTIAL/PLATELET
Abs Immature Granulocytes: 0.11 10*3/uL — ABNORMAL HIGH (ref 0.00–0.07)
Basophils Absolute: 0 10*3/uL (ref 0.0–0.1)
Basophils Relative: 0 %
Eosinophils Absolute: 0 10*3/uL (ref 0.0–0.5)
Eosinophils Relative: 0 %
HCT: 34.6 % — ABNORMAL LOW (ref 36.0–46.0)
Hemoglobin: 11.2 g/dL — ABNORMAL LOW (ref 12.0–15.0)
Immature Granulocytes: 1 %
Lymphocytes Relative: 2 %
Lymphs Abs: 0.2 10*3/uL — ABNORMAL LOW (ref 0.7–4.0)
MCH: 29.9 pg (ref 26.0–34.0)
MCHC: 32.4 g/dL (ref 30.0–36.0)
MCV: 92.3 fL (ref 80.0–100.0)
Monocytes Absolute: 0.3 10*3/uL (ref 0.1–1.0)
Monocytes Relative: 3 %
Neutro Abs: 9 10*3/uL — ABNORMAL HIGH (ref 1.7–7.7)
Neutrophils Relative %: 94 %
Platelets: 208 10*3/uL (ref 150–400)
RBC: 3.75 MIL/uL — ABNORMAL LOW (ref 3.87–5.11)
RDW: 17.3 % — ABNORMAL HIGH (ref 11.5–15.5)
WBC: 9.6 10*3/uL (ref 4.0–10.5)
nRBC: 0 % (ref 0.0–0.2)

## 2021-08-08 LAB — LACTIC ACID, PLASMA
Lactic Acid, Venous: 2.1 mmol/L (ref 0.5–1.9)
Lactic Acid, Venous: 2.3 mmol/L (ref 0.5–1.9)

## 2021-08-08 LAB — PROTIME-INR
INR: 1.5 — ABNORMAL HIGH (ref 0.8–1.2)
Prothrombin Time: 17.6 seconds — ABNORMAL HIGH (ref 11.4–15.2)

## 2021-08-08 LAB — BRAIN NATRIURETIC PEPTIDE: B Natriuretic Peptide: 76 pg/mL (ref 0.0–100.0)

## 2021-08-08 MED ORDER — SODIUM CHLORIDE 0.9 % IV SOLN
2.0000 g | INTRAVENOUS | Status: DC
  Administered 2021-08-09 – 2021-08-12 (×4): 2 g via INTRAVENOUS
  Filled 2021-08-08 (×4): qty 20

## 2021-08-08 MED ORDER — SODIUM CHLORIDE 0.9 % IV SOLN: INTRAVENOUS | Status: DC

## 2021-08-08 MED ORDER — ONDANSETRON HCL 4 MG PO TABS: 4.0000 mg | ORAL_TABLET | Freq: Four times a day (QID) | ORAL | Status: DC | PRN

## 2021-08-08 MED ORDER — ARFORMOTEROL TARTRATE 15 MCG/2ML IN NEBU
15.0000 ug | INHALATION_SOLUTION | Freq: Two times a day (BID) | RESPIRATORY_TRACT | Status: DC
  Administered 2021-08-09 – 2021-08-13 (×9): 15 ug via RESPIRATORY_TRACT
  Filled 2021-08-08 (×9): qty 2

## 2021-08-08 MED ORDER — IPRATROPIUM-ALBUTEROL 0.5-2.5 (3) MG/3ML IN SOLN: 3.0000 mL | Freq: Four times a day (QID) | RESPIRATORY_TRACT | Status: DC | PRN

## 2021-08-08 MED ORDER — SODIUM CHLORIDE 0.9 % IV BOLUS
1000.0000 mL | Freq: Once | INTRAVENOUS | Status: AC
  Administered 2021-08-08: 1000 mL via INTRAVENOUS

## 2021-08-08 MED ORDER — SODIUM CHLORIDE 0.9% FLUSH
3.0000 mL | Freq: Two times a day (BID) | INTRAVENOUS | Status: DC
  Administered 2021-08-08 – 2021-08-13 (×8): 3 mL via INTRAVENOUS

## 2021-08-08 MED ORDER — ACETAMINOPHEN 650 MG RE SUPP: 650.0000 mg | Freq: Four times a day (QID) | RECTAL | Status: DC | PRN

## 2021-08-08 MED ORDER — SODIUM CHLORIDE 0.9 % IV SOLN
1.0000 g | Freq: Once | INTRAVENOUS | Status: AC
  Administered 2021-08-08: 1 g via INTRAVENOUS
  Filled 2021-08-08: qty 10

## 2021-08-08 MED ORDER — BUDESONIDE 0.5 MG/2ML IN SUSP
0.5000 mg | Freq: Two times a day (BID) | RESPIRATORY_TRACT | Status: DC
  Administered 2021-08-09 – 2021-08-13 (×9): 0.5 mg via RESPIRATORY_TRACT
  Filled 2021-08-08 (×9): qty 2

## 2021-08-08 MED ORDER — ONDANSETRON HCL 4 MG/2ML IJ SOLN: 4.0000 mg | Freq: Four times a day (QID) | INTRAMUSCULAR | Status: DC | PRN

## 2021-08-08 MED ORDER — POLYETHYLENE GLYCOL 3350 17 G PO PACK
17.0000 g | PACK | Freq: Every day | ORAL | Status: DC | PRN
  Administered 2021-08-11: 17 g via ORAL
  Filled 2021-08-08: qty 1

## 2021-08-08 MED ORDER — ESCITALOPRAM OXALATE 10 MG PO TABS
5.0000 mg | ORAL_TABLET | Freq: Every day | ORAL | Status: DC
  Administered 2021-08-09 – 2021-08-13 (×5): 5 mg via ORAL
  Filled 2021-08-08 (×5): qty 1

## 2021-08-08 MED ORDER — APIXABAN 5 MG PO TABS
5.0000 mg | ORAL_TABLET | Freq: Two times a day (BID) | ORAL | Status: DC
  Administered 2021-08-09 – 2021-08-13 (×9): 5 mg via ORAL
  Filled 2021-08-08 (×9): qty 1

## 2021-08-08 MED ORDER — SODIUM CHLORIDE 0.9 % IV SOLN
750.0000 mg | Freq: Two times a day (BID) | INTRAVENOUS | Status: DC
  Administered 2021-08-09 – 2021-08-11 (×6): 750 mg via INTRAVENOUS
  Filled 2021-08-08 (×10): qty 7.5

## 2021-08-08 MED ORDER — ACETAMINOPHEN 325 MG PO TABS
650.0000 mg | ORAL_TABLET | Freq: Four times a day (QID) | ORAL | Status: DC | PRN
  Administered 2021-08-09: 650 mg via ORAL
  Filled 2021-08-08: qty 2

## 2021-08-08 MED ORDER — MIDODRINE HCL 5 MG PO TABS: 5.0000 mg | ORAL_TABLET | Freq: Two times a day (BID) | ORAL | Status: DC

## 2021-08-08 MED ORDER — LEVETIRACETAM 500 MG/5ML IV SOLN
INTRAVENOUS | Status: AC
  Filled 2021-08-08: qty 10

## 2021-08-08 MED ORDER — LEVETIRACETAM 100 MG/ML PO SOLN
750.0000 mg | Freq: Two times a day (BID) | ORAL | Status: DC
Start: 2021-08-08 — End: 2021-08-08
  Filled 2021-08-08 (×4): qty 7.5

## 2021-08-08 NOTE — Plan of Care (Signed)
  Problem: Education: Goal: Knowledge of General Education information will improve Description: Including pain rating scale, medication(s)/side effects and non-pharmacologic comfort measures Outcome: Progressing   Problem: Health Behavior/Discharge Planning: Goal: Ability to manage health-related needs will improve Outcome: Progressing   Problem: Clinical Measurements: Goal: Will remain free from infection Outcome: Progressing Goal: Respiratory complications will improve Outcome: Progressing   Problem: Activity: Goal: Risk for activity intolerance will decrease Outcome: Progressing   Problem: Nutrition: Goal: Adequate nutrition will be maintained Outcome: Progressing   Problem: Pain Managment: Goal: General experience of comfort will improve Outcome: Progressing   Problem: Safety: Goal: Ability to remain free from injury will improve Outcome: Progressing   Problem: Skin Integrity: Goal: Risk for impaired skin integrity will decrease Outcome: Progressing   

## 2021-08-08 NOTE — ED Provider Notes (Addendum)
?[place] ?Provider Note ? ? ?CSN: [id number] ?Arrival date & time: 08/08/21  1424 ? ?  ? ?History ? ?Chief Complaint  ?Patient presents with  ? Hypotension  ? ? ?Veronica Horton is a 61 y.o. female with a history significant for hypertension, COPD, current laryngeal cancer under the care of heme-onc at [facility], history of sepsis presenting for evaluation of worsening right lower extremity swelling, pain and redness.  Her significant other is at the bedside and gives a fair amount of her history as patient herself is very drowsy at this time.  He states that she generally stays awake all night and sleeps during the day so that this is her norm.  She presents on home oxygen which is also a chronic home prescription.  Regardless, he states that she has had persistent redness in her bilateral legs from her knees down for significant amount of time along with weeping of clear fluid from her lower legs.  She has home, twice weekly for bandage changes.  The boyfriend noticed that the redness in her left leg has significantly changed over the past several days, reporting the redness is now expanding to her upper medial thigh.  She has had no fevers, nausea or vomiting to his knowledge.  She does have a tracheostomy in place secondary to her throat mass but is currently taking all of her intake as p.o. and soft food.  She is currently undecided about having surgical intervention and is supposed to have a repeat CT soft tissue neck next week to assess for stability of this mass.  She also has a history of seizure disorder but the boyfriend confirms she did not have a seizure today.  She fell out of her recliner chair prior to arriving here, stating she fell asleep and slid out of her chair.  She denies any injury or pain secondary to this fall. ? ?The history is provided by the patient, the EMS personnel and medical records.  ? ?  ? ?Home Medications ?Prior to Admission medications   ?Medication Sig  Start Date End Date Taking? Authorizing Provider  ?apixaban (ELIQUIS) 5 MG TABS tablet Take 1 tablet (5 mg total) by mouth 2 (two) times daily. Restart taking only after next Monday(8/22) 12/20/20   Shelly Coss, MD  ?arformoterol (BROVANA) 15 MCG/2ML NEBU Take 2 mLs (15 mcg total) by nebulization 2 (two) times daily. 04/16/20   Pokhrel, Corrie Mckusick, MD  ?budesonide (PULMICORT) 0.5 MG/2ML nebulizer solution Take 2 mLs (0.5 mg total) by nebulization 2 (two) times daily. 04/16/20   Pokhrel, Corrie Mckusick, MD  ?dexamethasone (DECADRON) 4 MG tablet Place 1 tablet (4 mg total) into feeding tube 2 (two) times daily. 12/13/20   Shelly Coss, MD  ?diclofenac Sodium (VOLTAREN) 1 % GEL Apply 2 g topically 4 (four) times daily as needed (neck/back pain). 04/16/20   Pokhrel, Corrie Mckusick, MD  ?diphenhydrAMINE (BENADRYL) 12.5 MG/5ML elixir Place 5 mLs (12.5 mg total) into feeding tube every 6 (six) hours as needed for itching. 04/16/20   Pokhrel, Corrie Mckusick, MD  ?escitalopram (LEXAPRO) 5 MG tablet Place 1 tablet (5 mg total) into feeding tube daily. 12/13/20   Shelly Coss, MD  ?famotidine (PEPCID) 20 MG tablet Place 1 tablet (20 mg total) into feeding tube daily. 12/13/20 01/12/21  Shelly Coss, MD  ?folic acid (FOLVITE) 1 MG tablet Place 1 tablet (1 mg total) into feeding tube daily. 12/13/20   Shelly Coss, MD  ?furosemide (LASIX) 40 MG tablet Place 1 tablet (40  mg total) into feeding tube daily. Start taking On Thursday(12/16/20) 12/16/20   Shelly Coss, MD  ?gabapentin (NEURONTIN) 300 MG capsule Place 2 capsules (600 mg total) into feeding tube at bedtime. 12/13/20   Shelly Coss, MD  ?ipratropium-albuterol (DUONEB) 0.5-2.5 (3) MG/3ML SOLN Take 3 mLs by nebulization every 6 (six) hours as needed. ?Patient not taking: Reported on 11/29/2020 04/16/20 05/16/20  Flora Lipps, MD  ?ketotifen (ZADITOR) 0.025 % ophthalmic solution Place 1 drop into both eyes 2 (two) times daily. 04/16/20   Pokhrel, Corrie Mckusick, MD  ?levETIRAcetam (KEPPRA) 100  MG/ML solution Place 7.5 mLs (750 mg total) into feeding tube 2 (two) times daily. 12/13/20   Shelly Coss, MD  ?lidocaine (LIDODERM) 5 % Place 1 patch onto the skin daily. Remove & Discard patch within 12 hours or as directed by MD 08/09/20   Benay Pike, MD  ?midodrine (PROAMATINE) 5 MG tablet Place 1 tablet (5 mg total) into feeding tube 2 (two) times daily with a meal. 12/13/20   Shelly Coss, MD  ?Nutritional Supplements (FEEDING SUPPLEMENT, OSMOLITE 1.5 CAL,) LIQD Place 237 mLs into feeding tube 3 (three) times daily. 12/13/20   Shelly Coss, MD  ?ondansetron (ZOFRAN) 8 MG tablet Place 1 tablet (8 mg total) into feeding tube every 8 (eight) hours as needed for nausea or vomiting. 12/13/20   Shelly Coss, MD  ?oxyCODONE (OXY IR/ROXICODONE) 5 MG immediate release tablet Take 1 tablet (5 mg total) by mouth every 6 (six) hours as needed for moderate pain. 12/13/20   Shelly Coss, MD  ?polyethylene glycol powder (GLYCOLAX/MIRALAX) 17 GM/SCOOP powder Dissolve 1 capful (17 g) in liquid and add into feeding tube daily as needed for moderate constipation. 12/13/20   Shelly Coss, MD  ?potassium chloride SA (KLOR-CON) 20 MEQ tablet Take 1 tablet (20 mEq total) by mouth daily. 06/16/20   Benay Pike, MD  ?prochlorperazine (COMPAZINE) 10 MG tablet Place 1 tablet (10 mg total) into feeding tube every 6 (six) hours as needed for nausea or vomiting. 12/13/20   Shelly Coss, MD  ?scopolamine (TRANSDERM-SCOP) 1 MG/3DAYS Place 1 patch (1.5 mg total) onto the skin every 3 (three) days. ?Patient not taking: Reported on 11/29/2020 04/18/20   Flora Lipps, MD  ?vitamin B-12 (CYANOCOBALAMIN) 1000 MCG tablet Place 0.5 tablets (500 mcg total) into feeding tube daily. 12/13/20   Shelly Coss, MD  ?   ? ?Allergies    ?Codeine and Penicillins   ? ?Review of Systems   ?Review of Systems  ?Constitutional:  Positive for fatigue. Negative for chills and fever.  ?HENT:  Negative for congestion and sore throat.   ?Eyes:  Negative.   ?Respiratory:  Negative for chest tightness and shortness of breath.   ?Cardiovascular:  Positive for leg swelling. Negative for chest pain and palpitations.  ?Gastrointestinal:  Negative for abdominal pain, nausea and vomiting.  ?Genitourinary: Negative.   ?Musculoskeletal:  Negative for arthralgias, joint swelling and neck pain.  ?Skin:  Positive for color change and wound. Negative for rash.  ?Neurological:  Positive for weakness. Negative for dizziness, light-headedness, numbness and headaches.  ?Psychiatric/Behavioral: Negative.    ?All other systems reviewed and are negative. ? ?Physical Exam ?Updated Vital Signs ?BP (!) 119/58   Pulse 78   Temp 98.3 ?F (36.8 ?C) (Oral)   Resp 20   Ht '5\' 5"'$  (1.651 m)   Wt 121.9 kg   SpO2 96%   BMI 44.71 kg/m?  ?Physical Exam ?Vitals and nursing note reviewed.  ?Constitutional:   ?  Appearance: She is well-developed.  ?HENT:  ?   Head: Normocephalic and atraumatic.  ?Eyes:  ?   Conjunctiva/sclera: Conjunctivae normal.  ?Cardiovascular:  ?   Rate and Rhythm: Normal rate and regular rhythm.  ?   Heart sounds: Normal heart sounds.  ?Pulmonary:  ?   Effort: Pulmonary effort is normal. No respiratory distress.  ?   Breath sounds: Normal breath sounds. No stridor. No wheezing.  ?   Comments: Reports on home O2 8L at baseline.  Tracheostomy in place. ?Abdominal:  ?   General: Bowel sounds are normal.  ?   Palpations: Abdomen is soft.  ?   Tenderness: There is no abdominal tenderness.  ?Musculoskeletal:     ?   General: Normal range of motion.  ?   Cervical back: Normal range of motion.  ?Skin: ?   General: Skin is warm and dry.  ?   Findings: Erythema present.  ?   Comments: Bilateral erythema lower extremities.  Weeping clear to yellow translucent fluid, left greater than right.  Left leg with new erythema spreading to medial upper thigh.   ?Neurological:  ?   Mental Status: She is lethargic.  ?   Comments: Drowsy, but wakes easily.  Responds appropriately,  whispers answers.  ? ? ?ED Results / Procedures / Treatments   ?Labs ?(all labs ordered are listed, but only abnormal results are displayed) ?Labs Reviewed  ?COMPREHENSIVE METABOLIC PANEL - Abnormal; Notable for th

## 2021-08-08 NOTE — ED Notes (Signed)
EDP notified that patient's O2 sat  dropping  near 88-87% on 2L/min. Increase oxygen to 5l/min via [place]. ?

## 2021-08-08 NOTE — H&P (Addendum)
?History and Physical  ? ? ?Patient: Veronica Horton UEA:[id number] DOB: February 15, 1961 ?DOA: 08/08/2021 ?DOS: the patient was seen and examined on 08/08/2021 ?PCP: [facility]  ?Patient coming from: Home. Wheelchair bound at baseline. ? ? ?Chief Complaint: Worsening left leg erythema with concerns for cellulitis. ?Chief Complaint  ?Patient presents with  ? Hypotension  ? ?HPI: Veronica Horton is a 61 y.o. 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 Right leg looks at baseline, but the left leg looks more erythematous per her significant other. She was noted to be drowsy by EDP, but boyfriend relates that she has her "days and nights backwards." She also fell asleep and slid out of her chair but no injuries reported from the fall. Fell onto sacral area. Veronica Horton is unable to provide much additional history secondary to lethargy.  She wakens enough to answer simple questions before falling back to sleep.  Denies SOB.  Says her legs look "worse" than they normally do, and that they "hurt." No family is at the bedside.  ? ?In the ED, noted to be mildly hypotensive and hypoxic requiring 5 L of oxygen. She was given a 500 cc bolus and started on maintenance IVF due to soft BP and was started on empiric antibiotics. ABG showed a pH of 7.46, pO2 69, PCO2 41.  CBC notable for WBC of 9.6.  Chemistries show a BUN of 75, creatinine 2.56 (baseline 2.17), GFR of 21, slightly worse than baseline.  BNP is 76. CXR shows bilateral bandlike atelectasis. ? ? ?Review of Systems: unable to review all systems  due to the inability of the patient to answer questions. She is quite drowsy. ? ?Past Medical History:  ?Diagnosis Date  ? Aspiration pneumonitis ([place]) 01/17/2019  ? Bronchitis   ? Class 3 obesity ([place]) 01/23/2020  ? COPD (chronic obstructive pulmonary disease) ([place])   ? Degenerative disc disease, lumbar   ? DVT (deep venous thrombosis) ([place]) 08/08/2021  ? Hiatal hernia   ? Hypertension   ? Seizure ([name]) 11/28/2020  ? Small bowel obstruction ([place]) 01/13/2019  ? Throat cancer ([place])   ? ?Past Surgical History:  ?Procedure Laterality Date  ? BIOPSY  04/12/2020  ? Procedure: BIOPSY;  Surgeon: Wilford Corner, MD;  Location: [facility];  Service: Gastroenterology;;  ? CHOLECYSTECTOMY    ? degenerative bone disease    ? DIRECT LARYNGOSCOPY N/A 03/13/2020  ? Procedure: DIRECT LARYNGOSCOPY WITH BIOPSY;  Surgeon: Jason Coop, DO;  Location: [place];  Service: ENT;  Laterality: N/A;  ? FLEXIBLE SIGMOIDOSCOPY N/A 04/12/2020  ? Procedure: FLEXIBLE SIGMOIDOSCOPY;  Surgeon: Wilford Corner, MD;  Location: [facility];  Service: Gastroenterology;  Laterality: N/A;  ? INCISIONAL HERNIA REPAIR N/A 01/15/2019  ? Procedure: INCISIONAL HERNIORRHAPHY WITH MESH;  Surgeon: [name], MD;  Location: [facility];  Service: General;  Laterality: N/A;  ? IR GASTROSTOMY TUBE MOD SED  03/22/2020  ? IR GJ TUBE CHANGE  07/24/2020  ? IR RADIOLOGIST EVAL & MGMT  05/07/2020  ? IR REPLACE G-TUBE SIMPLE WO FLUORO  11/30/2020  ? OMENTECTOMY N/A 01/15/2019  ? Procedure:  OMENTECTOMY;  Surgeon: [name], MD;  Location: [facility];  Service: General;  Laterality: N/A;  ? POLYPECTOMY  04/12/2020  ? Procedure: POLYPECTOMY;  Surgeon: Wilford Corner, MD;  Location: [facility];  Service: Gastroenterology;;  ? TRACHEOSTOMY TUBE PLACEMENT N/A 03/13/2020  ? Procedure: AWAKE TRACHEOSTOMY;  Surgeon: Jason Coop, DO;  Location: [place];  Service: ENT;  Laterality: N/A;  ? ?Social History:  reports that she has been smoking cigarettes. She has been  smoking an average of 1 pack per day. She has never used smokeless tobacco. She reports that she does not drink alcohol and does not use drugs. ? ?Allergies  ?Allergen Reactions  ? Codeine Hives and Itching  ?  Reports itching only per RN  ? Penicillins Hives  ?  Did it involve swelling of the face/tongue/throat, SOB, or low BP? No ?Did it involve sudden or severe rash/hives, skin peeling, or any reaction on the inside of your mouth or nose? Yes ?Did you need to seek medical attention at a hospital or doctor's office? Unknown ?When did it last happen?      Over 10 years ?If all above answers are ?NO?, may proceed with cephalosporin use. ?  ? ? ?Family History  ?Problem Relation Age of Onset  ? Hypertension Mother   ? Sudden Cardiac Death Neg Hx   ? ? ?Prior to Admission medications   ?Medication Sig Start Date End Date Taking? Authorizing Provider  ?apixaban (ELIQUIS) 5 MG TABS tablet Take 1 tablet (5 mg total) by mouth 2 (two) times daily. Restart taking only after next Monday(8/22) 12/20/20   Shelly Coss, MD  ?arformoterol (BROVANA) 15 MCG/2ML NEBU Take 2 mLs (15 mcg total) by nebulization 2 (two) times daily. 04/16/20   Pokhrel, Corrie Mckusick, MD  ?budesonide (PULMICORT) 0.5 MG/2ML nebulizer solution Take 2 mLs (0.5 mg total) by nebulization 2 (two) times daily. 04/16/20   Pokhrel, Corrie Mckusick, MD  ?dexamethasone (DECADRON) 4 MG tablet Place 1 tablet (4 mg total) into feeding tube 2 (two) times daily. 12/13/20   Shelly Coss, MD  ?diclofenac Sodium (VOLTAREN) 1 % GEL Apply 2 g topically 4 (four) times daily as needed (neck/back pain). 04/16/20   Pokhrel, Corrie Mckusick, MD  ?diphenhydrAMINE (BENADRYL) 12.5 MG/5ML elixir Place 5 mLs (12.5 mg total) into feeding tube every 6 (six) hours as needed for itching. 04/16/20   Pokhrel, Corrie Mckusick, MD  ?escitalopram (LEXAPRO) 5 MG tablet Place 1 tablet (5 mg total) into feeding tube daily. 12/13/20   Shelly Coss, MD  ?famotidine (PEPCID) 20 MG tablet Place 1 tablet (20 mg total) into  feeding tube daily. 12/13/20 01/12/21  Shelly Coss, MD  ?folic acid (FOLVITE) 1 MG tablet Place 1 tablet (1 mg total) into feeding tube daily. 12/13/20   Shelly Coss, MD  ?furosemide (LASIX) 40 MG tablet Place 1 tablet (40 mg total) into feeding tube daily. Start taking On Thursday(12/16/20) 12/16/20   Shelly Coss, MD  ?gabapentin (NEURONTIN) 300 MG capsule Place 2 capsules (600 mg total) into feeding tube at bedtime. 12/13/20   Shelly Coss, MD  ?ipratropium-albuterol (DUONEB) 0.5-2.5 (3) MG/3ML SOLN Take 3 mLs by nebulization every 6 (six) hours as needed. ?Patient not taking: Reported on 11/29/2020 04/16/20 05/16/20  Flora Lipps, MD  ?ketotifen (ZADITOR) 0.025 % ophthalmic solution Place 1 drop into both eyes 2 (two) times daily. 04/16/20   Pokhrel, Corrie Mckusick, MD  ?levETIRAcetam (KEPPRA) 100 MG/ML solution Place 7.5 mLs (750 mg total) into feeding tube 2 (two) times daily. 12/13/20   Adhikari,  Amrit, MD  ?lidocaine (LIDODERM) 5 % Place 1 patch onto the skin daily. Remove & Discard patch within 12 hours or as directed by MD 08/09/20   Benay Pike, MD  ?midodrine (PROAMATINE) 5 MG tablet Place 1 tablet (5 mg total) into feeding tube 2 (two) times daily with a meal. 12/13/20   Shelly Coss, MD  ?Nutritional Supplements (FEEDING SUPPLEMENT, OSMOLITE 1.5 CAL,) LIQD Place 237 mLs into feeding tube 3 (three) times daily. 12/13/20   Shelly Coss, MD  ?ondansetron (ZOFRAN) 8 MG tablet Place 1 tablet (8 mg total) into feeding tube every 8 (eight) hours as needed for nausea or vomiting. 12/13/20   Shelly Coss, MD  ?oxyCODONE (OXY IR/ROXICODONE) 5 MG immediate release tablet Take 1 tablet (5 mg total) by mouth every 6 (six) hours as needed for moderate pain. 12/13/20   Shelly Coss, MD  ?polyethylene glycol powder (GLYCOLAX/MIRALAX) 17 GM/SCOOP powder Dissolve 1 capful (17 g) in liquid and add into feeding tube daily as needed for moderate constipation. 12/13/20   Shelly Coss, MD  ?potassium chloride SA  (KLOR-CON) 20 MEQ tablet Take 1 tablet (20 mEq total) by mouth daily. 06/16/20   Benay Pike, MD  ?prochlorperazine (COMPAZINE) 10 MG tablet Place 1 tablet (10 mg total) into feeding tube every 6 (six) h

## 2021-08-08 NOTE — ED Triage Notes (Signed)
Pt arrives from home via [facility] with complaints of hypotension, bruising and weeping edema to bilateral legs. [place] nurse supervisor called EMS d/t noticing excessive edema and bruising to legs, however HH cna states this is normal. Pt reports sliding out of recliner this morning and lowering self to floor. Denies hitting head. Is taking eloquis. EMS states sbp ranging from 82-96. ?

## 2021-08-09 ENCOUNTER — Encounter (HOSPITAL_COMMUNITY)
Admission: RE | Admit: 2021-08-09 | Discharge: 2021-08-09 | Disposition: A | Discharge status: Home / Self Care | Payer: Medicaid Other | Source: Ambulatory Visit | Attending: Nephrology | Admitting: Nephrology

## 2021-08-09 DIAGNOSIS — A419 Sepsis, unspecified organism: Secondary | ICD-10-CM | POA: Diagnosis not present

## 2021-08-09 DIAGNOSIS — L0390 Cellulitis, unspecified: Secondary | ICD-10-CM | POA: Diagnosis not present

## 2021-08-09 LAB — BASIC METABOLIC PANEL
Anion gap: 8 (ref 5–15)
BUN: 66 mg/dL — ABNORMAL HIGH (ref 8–23)
CO2: 23 mmol/L (ref 22–32)
Calcium: 7.9 mg/dL — ABNORMAL LOW (ref 8.9–10.3)
Chloride: 107 mmol/L (ref 98–111)
Creatinine, Ser: 2.08 mg/dL — ABNORMAL HIGH (ref 0.44–1.00)
GFR, Estimated: 27 mL/min — ABNORMAL LOW (ref >60)
Glucose, Bld: 97 mg/dL (ref 70–99)
Potassium: 4 mmol/L (ref 3.5–5.1)
Sodium: 138 mmol/L (ref 135–145)

## 2021-08-09 LAB — BLOOD CULTURE ID PANEL (REFLEXED) - BCID2

## 2021-08-09 LAB — URINALYSIS, ROUTINE W REFLEX MICROSCOPIC
Bilirubin Urine: NEGATIVE
Glucose, UA: NEGATIVE mg/dL
Ketones, ur: NEGATIVE mg/dL
Leukocytes,Ua: NEGATIVE
Nitrite: NEGATIVE
Protein, ur: NEGATIVE mg/dL
Specific Gravity, Urine: 1.011 (ref 1.005–1.030)
pH: 5 (ref 5.0–8.0)

## 2021-08-09 LAB — CBC
HCT: 28.4 % — ABNORMAL LOW (ref 36.0–46.0)
Hemoglobin: 8.6 g/dL — ABNORMAL LOW (ref 12.0–15.0)
MCH: 28.6 pg (ref 26.0–34.0)
MCHC: 30.3 g/dL (ref 30.0–36.0)
MCV: 94.4 fL (ref 80.0–100.0)
Platelets: 147 10*3/uL — ABNORMAL LOW (ref 150–400)
RBC: 3.01 MIL/uL — ABNORMAL LOW (ref 3.87–5.11)
RDW: 16.8 % — ABNORMAL HIGH (ref 11.5–15.5)
WBC: 7.8 10*3/uL (ref 4.0–10.5)
nRBC: 0 % (ref 0.0–0.2)

## 2021-08-09 LAB — MRSA NEXT GEN BY PCR, NASAL: MRSA by PCR Next Gen: DETECTED — AB

## 2021-08-09 LAB — HIV ANTIBODY (ROUTINE TESTING W REFLEX): HIV Screen 4th Generation wRfx: NONREACTIVE

## 2021-08-09 MED ORDER — MIDODRINE HCL 5 MG PO TABS
10.0000 mg | ORAL_TABLET | Freq: Three times a day (TID) | ORAL | Status: DC
  Administered 2021-08-09 – 2021-08-13 (×13): 10 mg via ORAL
  Filled 2021-08-09 (×13): qty 2

## 2021-08-09 MED ORDER — SODIUM CHLORIDE 0.9 % IV BOLUS
1000.0000 mL | Freq: Once | INTRAVENOUS | Status: AC
Start: 2021-08-09 — End: 2021-08-09
  Administered 2021-08-09: 1000 mL via INTRAVENOUS

## 2021-08-09 MED ORDER — MIDODRINE HCL 5 MG PO TABS: 10.0000 mg | ORAL_TABLET | Freq: Three times a day (TID) | ORAL | Status: DC

## 2021-08-09 MED ORDER — CHLORHEXIDINE GLUCONATE CLOTH 2 % EX PADS
6.0000 | MEDICATED_PAD | Freq: Every day | CUTANEOUS | Status: DC
  Administered 2021-08-10 – 2021-08-12 (×3): 6 via TOPICAL

## 2021-08-09 MED ORDER — MIDODRINE HCL 5 MG PO TABS: 5.0000 mg | ORAL_TABLET | Freq: Three times a day (TID) | ORAL | Status: DC

## 2021-08-09 MED ORDER — MUPIROCIN 2 % EX OINT
TOPICAL_OINTMENT | Freq: Two times a day (BID) | CUTANEOUS | Status: DC
  Administered 2021-08-09: 1 via NASAL
  Filled 2021-08-09 (×2): qty 22

## 2021-08-09 NOTE — Progress Notes (Signed)
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 There is supposed to be an inner cannula but patient states she doesn't use one because she said it feels like its choking her. Explained to patient the importance of using an inner cannula to keep trach clear patent and open and so we can change them every shift. Patient requested suctioning. I went down her trach and suctioned a small to moderate amount of white clear mucus. Patient then wanted RT to take trach out and suction down stoma hole. RT explained that this was not a good idea and an inner cannula should be used to keep trach cleaned appropriately. Patient says she will leave it as is for now. ?

## 2021-08-09 NOTE — Progress Notes (Signed)
Patient level of care downgraded to Telemetry. Patient with new bed assignment [facility]- Room 314. Report called to Dorinda Hill, RN. Handoff completed.  ?Patient transferred to 300 bed, transported via bed by [facility] RN Alyse Low, RN.  ?

## 2021-08-09 NOTE — Progress Notes (Signed)
?PROGRESS NOTE ? ? ? ? ?Veronica Horton, is a 61 y.o. female, DOB - 03-24-1961, ZDG:[id number] ? ?Admit date - 08/08/2021   Admitting Physician Venetia Maxon Rama, MD ? ?Outpatient Primary MD for the patient is [facility] ? ?LOS - 1 ? ?Chief Complaint  ?Patient presents with  ? Hypotension  ?    ? ? ?Brief Narrative:  ?61 y.o. 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 ?  ?-Assessment and Plan: ?1) sepsis secondary to bilateral cellulitis superimposed on chronic venous insufficiency--- ?-Continue IV Rocephin ?-No fevers or leukocytosis at this time ? ?2) H/O stage IV laryngeal cancer s/p tracheostomy with chronic hypoxic respiratory failure--- continue oxygen supplementation and bronchodilators ? ?3) COPD with ongoing tobacco use despite being on home O2--- associated risk of fire and death discussed with patient ?-Oxygen] dilators as above #2 ? ?4)Seizure disorder with h/o meningioma ?-Stable, continue Keppra. ? ?5)Morbid Obesity- ?-Low calorie diet, portion control discussed with patient ?-Body mass index is 43.4 kg/m?. ? ?6)H/o DVT--continue Eliquis ? ?7) acute anemia--- while on Eliquis ?-Hgb is down to 8.6 from 11.2 suspect some component of hemodilution ?-No frank bleeding noted continue to monitor closely ? ?8)Social/Ethics---  [facility] director, Angie, states that they are active with pt for RN at home. She states pt has Home O2 and a trach. They see pt for dressing changes and pt's sig other and sister are supposed to be doing the care on days [facility] isn't there.   Angie reports having concerns about pt's care at home and also the living conditions. She states that pt and her sig other live  in the mobile home belonging to pt's elderly aunt. She reports that there were insects crawling on pt's legs when she arrived to see pt yesterday. Pt has not had HH SW or APS involved up to this point.  ?-TOC consult requested ?-Patient is a full code ? ?Disposition/Need for in-Hospital Stay- patient unable to be discharged at this time due to --cellulitis requiring IV antibiotics, may need placement of facility* ? ?Status is: Inpatient  ? ?Disposition: The patient is from: Home ?             Anticipated d/c is to:  Home with HH Vs SNF  ?             Anticipated d/c date is: 2 days ?             Patient currently is not medically stable to d/c. ?Barriers: Not Clinically Stable-  ? ?Code Status :  -  Code Status: Full Code  ? ?Family Communication:    NA (patient is alert, awake and coherent)  ? ?DVT Prophylaxis  :   -   ?apixaban (ELIQUIS) tablet 5 mg  ? ?Lab Results  ?Component Value Date  ? PLT 147 (L) 08/09/2021  ? ? ?Inpatient Medications ? ?Scheduled Meds: ? apixaban  5 mg Oral BID  ? arformoterol  15 mcg Nebulization BID  ? budesonide  0.5 mg Nebulization BID  ? Chlorhexidine Gluconate Cloth  6 each Topical Daily  ? escitalopram  5 mg Oral Daily  ? midodrine  10 mg Oral TID WC  ? mupirocin ointment  Nasal BID  ? sodium chloride flush  3 mL Intravenous Q12H  ? ?Continuous Infusions: ? sodium chloride 75 mL/hr at 08/09/21 0750  ? cefTRIAXone (ROCEPHIN)  IV 2 g (08/09/21 1720)  ? levETIRAcetam 750 mg (08/09/21 1541)  ? ?PRN Meds:.acetaminophen **OR** acetaminophen, ipratropium-albuterol, ondansetron **OR** ondansetron (ZOFRAN) IV, polyethylene glycol ? ? ?Anti-infectives (From admission, onward)  ? ? Start     Dose/Rate Route Frequency Ordered Stop  ? 08/09/21 1600  cefTRIAXone (ROCEPHIN) 2 g in sodium chloride 0.9 % 100 mL IVPB       ? 2 g ?200 mL/hr over 30 Minutes Intravenous Every 24 hours 08/08/21 2254 08/16/21 1559  ? 08/08/21 1600  cefTRIAXone (ROCEPHIN) 1 g in sodium chloride 0.9 % 100 mL IVPB       ? 1  g ?200 mL/hr over 30 Minutes Intravenous  Once 08/08/21 1548 08/08/21 1645  ? ?  ? ?  ? ?Subjective: ?Veronica Horton today has no fevers, no emesis,  No chest pain,  ?-No shortness of breath ?-Leg discomfort is not worse ? ? ?Objective: ?Vitals:  ? 08/09/21 1138 08/09/21 1430 08/09/21 1720 08/09/21 1954  ?BP:  (!) 92/47    ?Pulse:  (!) 57    ?Resp:  16    ?Temp:  98.1 ?F (36.7 ?C)    ?TempSrc:  Oral    ?SpO2: 94% 94% 94% 95%  ?Weight:      ?Height:      ? ? ?Intake/Output Summary (Last 24 hours) at 08/09/2021 1959 ?Last data filed at 08/09/2021 1330 ?Gross per 24 hour  ?Intake 2931.77 ml  ?Output 920 ml  ?Net 2011.77 ml  ? ?Filed Weights  ? 08/08/21 1450 08/08/21 2300 08/09/21 0500  ?Weight: 121.9 kg 118.6 kg 118.3 kg  ? ? ?Physical Exam ? ?Gen:- Awake Alert, morbidly obese, no conversational dyspnea ?HEENT:- -Tracheostomy with trach collar-- ?Neck-Supple Neck,No JVD,.  ?Lungs-  CTAB , fair symmetrical air movement ?CV- S1, S2 normal, regular  ?Abd-  +ve B.Sounds, Abd Soft, No tenderness, increased truncal adiposity,    ?Extremity/Skin:-Warmth, erythema, ecchymosis, tenderness consistent with cellulitis----please see photos in epic ?Psych-affect is appropriate, oriented x3 ?Neuro-generalized weakness , no new focal deficits, no tremors ? ?Data Reviewed: I have personally reviewed following labs and imaging studies ? ?CBC: ?Recent Labs  ?Lab 08/08/21 ?1440 08/09/21 ?0427  ?WBC 9.6 7.8  ?NEUTROABS 9.0*  --   ?HGB 11.2* 8.6*  ?HCT 34.6* 28.4*  ?MCV 92.3 94.4  ?PLT 208 147*  ? ?Basic Metabolic Panel: ?Recent Labs  ?Lab 08/08/21 ?1440 08/09/21 ?0427  ?NA 135 138  ?K 4.0 4.0  ?CL 98 107  ?CO2 25 23  ?GLUCOSE 104* 97  ?BUN 75* 66*  ?CREATININE 2.56* 2.08*  ?CALCIUM 8.9 7.9*  ? ?GFR: ?Estimated Creatinine Clearance: 36.5 mL/min (A) (by C-G formula based on SCr of 2.08 mg/dL (H)). ?Liver Function Tests: ?Recent Labs  ?Lab 08/08/21 ?1440  ?AST 26  ?ALT 25  ?ALKPHOS 111  ?BILITOT 0.9  ?PROT 6.8  ?ALBUMIN 2.9*  ? ?Cardiac  Enzymes: ?No results for input(s): CKTOTAL, CKMB, CKMBINDEX, TROPONINI in the last 168 hours. ?BNP (last 3 results) ?No results for input(s): PROBNP in the last 8760 hours. ?HbA1C: ?No results for input(s): HGBA1C in the last 72 hours. ?Sepsis Labs: ?'@LABRCNTIP'$ (procalcitonin:4,lacticidven:4) ?) ?Recent Results (from the past 240 hour(s))  ?Culture, blood (Routine x 2)     Status: None (Preliminary result)  ? Collection Time: 08/08/21  2:40 PM  ? Specimen: BLOOD RIGHT HAND  ?  Result Value Ref Range Status  ? Specimen Description BLOOD RIGHT HAND  Final  ? Special Requests   Final  ?  BOTTLES DRAWN AEROBIC ONLY Blood Culture results may not be optimal due to an inadequate volume of blood received in culture bottles  ? Culture   Final  ?  NO GROWTH < 12 HOURS ?Performed at [facility], [address], [place], [place] [address] ?  ? Report Status PENDING  Incomplete  ?Culture, blood (Routine x 2)     Status: None (Preliminary result)  ? Collection Time: 08/08/21  2:40 PM  ? Specimen: Left Antecubital; Blood  ?Result Value Ref Range Status  ? Specimen Description   Final  ?  LEFT ANTECUBITAL ?Performed at [facility], [address], [place], [place] [address] ?  ? Special Requests   Final  ?  BOTTLES DRAWN AEROBIC AND ANAEROBIC Blood Culture adequate volume ?Performed at [facility], [address], [place], [place] [address] ?  ? Culture  Setup Time   Final  ?  IN BOTH AEROBIC AND ANAEROBIC BOTTLES ?GRAM NEGATIVE RODS ?Gram Stain Report Called to,Read Back By and Verified With: ERIC FIERKE @ 0622 ON 08/09/21 C VARNER ?CORRECTED RESULTS ?PREVIOUSLY REPORTED AS: GRAM POSITIVE COCCI IN DIPLOS ?CORRECTED RESULTS CALLED TO: [place] [id number] AT 9741 BY CM ?Performed at [facility], [place] [address] [place], [place] [address] ?  ? Culture GRAM NEGATIVE RODS  Final  ? Report Status PENDING  Incomplete  ?Blood Culture ID Panel (Reflexed)     Status: Abnormal  ? Collection Time: 08/08/21  2:40 PM  ?Result Value Ref  Range Status  ? Enterococcus faecalis NOT DETECTED NOT DETECTED Final  ? Enterococcus Faecium NOT DETECTED NOT DETECTED Final  ? Listeria monocytogenes NOT DETECTED NOT DETECTED Final  ? Staphylococcus specie

## 2021-08-09 NOTE — Consult Note (Signed)
[facility] Nurse wound consult note ?Consultation was completed by review of records, images and assistance from the bedside nurse/clinical staff.  ? ?Reason for Consult:weeping legs and intertriginous skin damage related to moisture ?Wound type:  ?Venous stasis with hemosiderin staining. Does not appear from records that patient has had management of LE edema previously. ?She is noted to have redness up the LLE into the thigh, unclear of the etiology of this progression up the leg. She has a baseline venous insufficiency, possible new onset cellulitis LLE.   ?Intertriginous skin damage related to moisture in skin folds ?Pressure Injury POA: NA ?Drainage (amount, consistency, odor) serous drainage noted LEs  ?Periwound:LE edema; active cellulitis ?Dressing procedure/placement/frequency:  ?No recent ABIs that I can find ?She has active cellulitis, until this has resolved and ABIs to determine safety for compression would not recommend compression therapy. ? ?Topical care for weeping, elevate legs as much as possible.  ?Silver antimicrobial wicking fabric to treat intertriginous dermatitis.  ? ?FU in wound care center of the patient's choice for work up for venous stasis and long term compression therapy needs.  ? ?Discussed POC with patient and bedside nurse.  ?Re consult if needed, will not follow at this time. ?Thanks ? Joniqua Sidle [facility] MSN, RN,CWOCN, CNS, CWON-AP [phone])  ?

## 2021-08-09 NOTE — TOC Initial Note (Signed)
Transition of Care (TOC) - Initial/Assessment Note  ? ? ?Patient Details  ?Name: Veronica Horton ?MRN: [id number] ?Date of Birth: [birth year] ? ?Transition of Care (TOC) CM/SW Contact:    ?Shade Flood, LCSW ?Phone Number: ?08/09/2021, 2:40 PM ? ?Clinical Narrative:                 ? ?Pt admitted from home. She has a high readmission risk score. Pt unable to participate in assessment today. Attempted to reach family contacts though neither contact answered the phone. Spoke with [facility], Angie, who states that they are active with pt for RN at home. She states pt has Home O2 and a trach. They see pt for dressing changes and pt's sig other and sister are supposed to be doing the care on days [facility] isn't there. Angie went to the home yesterday to see pt for herself and she is the one who arranged for pt to come to the hospital. Angie reports having concerns about pt's care at home and also the living conditions. She states that pt and her sig other live in the mobile home belonging to pt's elderly aunt. She reports that there were insects crawling on pt's legs when she arrived to see pt yesterday. Pt has not had HH SW or APS involved up to this point.  ? ?TOC will attempt to speak with pt again tomorrow to determine her choice for dc destination. If pt chooses to return home, will attempt to add [facility] SW to pt's orders and will possibly refer for APS.  ?  ?Barriers to Discharge: Continued Medical Work up ? ? ?Patient Goals and CMS Choice ?Patient states their goals for this hospitalization and ongoing recovery are:: go home ?  ?  ? ?Expected Discharge Plan and Services ?  ?In-house Referral: Clinical Social Work ?  ?Post Acute Care Choice: Resumption of Svcs/PTA Provider ?Living arrangements for the past 2 months: [place] ?                ?  ?  ?  ?  ?  ?  ?  ?  ?  ?  ? ?Prior Living Arrangements/Services ?Living arrangements for the past 2 months: [place] ?  ?Patient language and need for  interpreter reviewed:: Yes ?Do you feel safe going back to the place where you live?: Yes      ?Need for Family Participation in Patient Care: Yes (Comment) ?Care giver support system in place?: Yes (comment) ?Current home services: DME ?Criminal Activity/Legal Involvement Pertinent to Current Situation/Hospitalization: No - Comment as needed ? ?Activities of Daily Living ?Home Assistive Devices/Equipment: Gilford Rile (specify type) ?ADL Screening (condition at time of admission) ?Patient's cognitive ability adequate to safely complete daily activities?: Yes ?Is the patient deaf or have difficulty hearing?: No ?Does the patient have difficulty seeing, even when wearing glasses/contacts?: No ?Does the patient have difficulty concentrating, remembering, or making decisions?: No ?Patient able to express need for assistance with ADLs?: Yes ?Does the patient have difficulty dressing or bathing?: Yes ?Independently performs ADLs?: No ?Communication: Independent ?Dressing (OT): Needs assistance ?Is this a change from baseline?: Change from baseline, expected to last >3 days ?Grooming: Needs assistance ?Is this a change from baseline?: Change from baseline, expected to last >3 days ?Feeding: Independent ?Bathing: Needs assistance ?Is this a change from baseline?: Change from baseline, expected to last >3 days ?Toileting: Independent ?In/Out Bed: Needs assistance ?Is this a change from baseline?: Change from baseline, expected  to last >3 days ?Walks in Home: Needs assistance ?Is this a change from baseline?: Change from baseline, expected to last >3 days ?Does the patient have difficulty walking or climbing stairs?: Yes ?Weakness of Legs: Both ?Weakness of Arms/Hands: None ? ?Permission Sought/Granted ?  ?  ?   ?   ?   ?   ? ?Emotional Assessment ?  ?  ?  ?Orientation: : Oriented to Self ?Alcohol / Substance Use: Not Applicable ?Psych Involvement: No (comment) ? ?Admission diagnosis:  Cellulitis of lower extremity, unspecified  laterality [L03.119] ?Sepsis due to cellulitis (HCC) [L03.90, A41.9] ?Sepsis with acute organ dysfunction without septic shock, due to unspecified organism, unspecified type ([place]) [A41.9, R65.20] ?Patient Active Problem List  ? Diagnosis Date Noted  ? 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  ? DVT (deep venous thrombosis) ([place]) 08/08/2021  ? Seizure disorder ([place]) 11/28/2020  ? Chronic respiratory failure ([place])   ? Sepsis due to cellulitis ([place]) 04/24/2020  ? Abnormal findings on diagnostic imaging of abdomen 04/12/2020  ? Laryngeal cancer ([place]) 03/18/2020  ? Tracheostomy status ([place])   ? Head and neck cancer ([place])   ? Class 3 obesity ([place]) 01/23/2020  ? Tobacco abuse 01/23/2020  ? Incisional hernia, without obstruction or gangrene   ? COPD (chronic obstructive pulmonary disease) ([place])   ? Hypertension   ? Polycythemia   ? ?PCP:  [facility] ?Pharmacy:   ?[place], [place] [address] ?[place] [address] ?Phone: [phone] Fax: [phone] ? ?[facility] ?[name] ?[place] [address] ?Phone: [phone] Fax: [phone] ? ? ? ? ?Social Determinants of Health (SDOH) Interventions ?  ? ?Readmission Risk Interventions ? ?  08/09/2021  ?  2:37 PM 04/26/2020  ? 10:28 AM 01/20/2019  ? 11:57 AM  ?Readmission Risk Prevention Plan  ?Medication Screening   Complete  ?Transportation Screening Complete Complete Complete  ?Medication Review Press photographer) Complete Complete   ?PCP or Specialist appointment within 3-5 days of discharge  Complete   ?[place] or Home Care Consult Complete Complete   ?SW Recovery Care/Counseling Consult Complete Complete   ?Palliative Care Screening Not Applicable Not Applicable   ?[place] Not Applicable Patient Refused   ? ? ? ?

## 2021-08-10 ENCOUNTER — Ambulatory Visit (HOSPITAL_COMMUNITY): Payer: Medicaid Other

## 2021-08-10 DIAGNOSIS — J449 Chronic obstructive pulmonary disease, unspecified: Secondary | ICD-10-CM | POA: Diagnosis not present

## 2021-08-10 DIAGNOSIS — G40909 Epilepsy, unspecified, not intractable, without status epilepticus: Secondary | ICD-10-CM

## 2021-08-10 DIAGNOSIS — I10 Essential (primary) hypertension: Secondary | ICD-10-CM

## 2021-08-10 DIAGNOSIS — L0390 Cellulitis, unspecified: Secondary | ICD-10-CM | POA: Diagnosis not present

## 2021-08-10 DIAGNOSIS — I872 Venous insufficiency (chronic) (peripheral): Secondary | ICD-10-CM | POA: Diagnosis not present

## 2021-08-10 DIAGNOSIS — Z720 Tobacco use: Secondary | ICD-10-CM

## 2021-08-10 LAB — RENAL FUNCTION PANEL
Albumin: 2.1 g/dL — ABNORMAL LOW (ref 3.5–5.0)
Anion gap: 10 (ref 5–15)
BUN: 55 mg/dL — ABNORMAL HIGH (ref 8–23)
CO2: 22 mmol/L (ref 22–32)
Calcium: 8.2 mg/dL — ABNORMAL LOW (ref 8.9–10.3)
Chloride: 109 mmol/L (ref 98–111)
Creatinine, Ser: 1.65 mg/dL — ABNORMAL HIGH (ref 0.44–1.00)
GFR, Estimated: 35 mL/min — ABNORMAL LOW (ref >60)
Glucose, Bld: 94 mg/dL (ref 70–99)
Phosphorus: 4.1 mg/dL (ref 2.5–4.6)
Potassium: 3.5 mmol/L (ref 3.5–5.1)
Sodium: 141 mmol/L (ref 135–145)

## 2021-08-10 LAB — CBC
HCT: 27.2 % — ABNORMAL LOW (ref 36.0–46.0)
Hemoglobin: 8.6 g/dL — ABNORMAL LOW (ref 12.0–15.0)
MCH: 29.8 pg (ref 26.0–34.0)
MCHC: 31.6 g/dL (ref 30.0–36.0)
MCV: 94.1 fL (ref 80.0–100.0)
Platelets: 177 10*3/uL (ref 150–400)
RBC: 2.89 MIL/uL — ABNORMAL LOW (ref 3.87–5.11)
RDW: 17 % — ABNORMAL HIGH (ref 11.5–15.5)
WBC: 8.8 10*3/uL (ref 4.0–10.5)
nRBC: 0 % (ref 0.0–0.2)

## 2021-08-10 MED ORDER — OXYCODONE HCL 5 MG PO TABS
5.0000 mg | ORAL_TABLET | ORAL | Status: DC | PRN
  Administered 2021-08-10 – 2021-08-11 (×2): 5 mg via ORAL
  Administered 2021-08-12 (×2): 7.5 mg via ORAL
  Filled 2021-08-10 (×2): qty 2
  Filled 2021-08-10 (×2): qty 1

## 2021-08-10 MED ORDER — LEVETIRACETAM 500 MG/5ML IV SOLN
INTRAVENOUS | Status: AC
  Filled 2021-08-10: qty 10

## 2021-08-10 NOTE — Progress Notes (Signed)
?PROGRESS NOTE ? ? ?Veronica Horton  GEZ:[id number] DOB: [birth year] DOA: 08/08/2021 ?PCP: [facility]  ? ?Chief Complaint  ?Patient presents with  ? Hypotension  ? ?Level of care: Telemetry ? ?Brief Admission History:  ?61 y.o. 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  ?Assessment and Plan: ? ?1) sepsis secondary to bilateral cellulitis superimposed on chronic venous insufficiency--- ?-Continue IV Rocephin ?-No fevers or leukocytosis at this time ?  ?2) H/O stage IV laryngeal cancer s/p tracheostomy with chronic hypoxic respiratory failure--- continue oxygen supplementation and bronchodilators ?  ?3) COPD with ongoing tobacco use despite being on home O2--- associated risk of fire and death discussed with patient ?-bronchodilators as above #2 ?  ?4)Seizure disorder with h/o meningioma ?-Stable, continue Keppra. ?  ?5)Morbid Obesity- ?-Low calorie diet, portion control discussed with patient ?-Body mass index is 43.4 kg/m?. ?  ?6)H/o DVT--continue Eliquis ?  ?7) acute anemia--- while on Eliquis ?-Hgb is down to 8.6 from 11.2 suspect hemodilution ?-No frank bleeding noted continue to monitor closely ?  ?8)Social/Ethics---  [facility] director, Angie, states that they are active with pt for RN at home. She states pt has Home O2 and a trach. They see pt for dressing changes and pt's sig other and sister are supposed to be doing the care on days [facility] isn't there.   Angie reports having concerns about pt's care at home and also the living conditions. She states that pt and her sig other live in the mobile home belonging to pt's elderly aunt. She reports that there were insects crawling on pt's legs  when she arrived to see pt yesterday. Pt has not had HH SW or APS involved up to this point.  ?-TOC consult requested ?-Patient is a full code ?  ?Disposition/Need for in-Hospital Stay- patient unable to be discharged at this time due to --cellulitis requiring IV antibiotics, may need placement of facility* ?  ?Status is: Inpatient  ?  ?Disposition: The patient is from: Home ?             Anticipated d/c is to:  Home with HH Vs SNF  ?             Anticipated d/c date is: 2 days ?             Patient currently is not medically stable to d/c. ? ?DVT prophylaxis: apixaban  ?Code Status: Full  ?Family Communication:  ?Disposition: Status is: Inpatient ?Remains inpatient appropriate because: continue to require IV antibiotic therapy  ?  ?Consultants:  ?wound ?Procedures:  ? ?Antimicrobials:  ?Ceftriaxone 4/11>>  ?Subjective: ?Pt reports painful legs asking for pain medication.   ?Objective: ?Vitals:  ? 08/10/21 0729 08/10/21 1252 08/10/21 1408 08/10/21 1508  ?BP:   (!) 105/53   ?Pulse: (!) 59 62 (!) 54 (!) 59  ?Resp: '16 18 18 18  '$ ?Temp:   97.8 ?F (36.6 ?C)   ?TempSrc:   Oral   ?SpO2: 100% 100% 100% 100%  ?Weight:      ?Height:      ? ? ?Intake/Output Summary (Last 24 hours) at 08/10/2021 1714 ?Last data filed at 08/10/2021 1300 ?Gross per 24 hour  ?Intake 365.01 ml  ?  Output --  ?Net 365.01 ml  ? ?Filed Weights  ? 08/08/21 1450 08/08/21 2300 08/09/21 0500  ?Weight: 121.9 kg 118.6 kg 118.3 kg  ? ?Examination: ? ?General exam: Appears calm and comfortable  ?Respiratory system: Clear to auscultation. Respiratory effort normal. ?Cardiovascular system: normal S1 & S2 heard. No JVD, murmurs, rubs, gallops or clicks. No pedal edema. ?Gastrointestinal system: Abdomen is nondistended, soft and nontender. No organomegaly or masses felt. Normal bowel sounds heard. ?Central nervous system: Alert and oriented. No focal neurological deficits. ?Extremities: chronic lymphedema BLEs.  Symmetric 5 x 5 power. ?Skin: diffuse erythema  consistent with cellulitis bilateral LEs. ?Psychiatry: Judgement and insight appear normal. Mood & affect appropriate.  ? ?Data Reviewed: I have personally reviewed following labs and imaging studies ? ?CBC: ?Recent Labs  ?Lab 08/08/21 ?1440 08/09/21 ?0427 08/10/21 ?0449  ?WBC 9.6 7.8 8.8  ?NEUTROABS 9.0*  --   --   ?HGB 11.2* 8.6* 8.6*  ?HCT 34.6* 28.4* 27.2*  ?MCV 92.3 94.4 94.1  ?PLT 208 147* 177  ? ? ?Basic Metabolic Panel: ?Recent Labs  ?Lab 08/08/21 ?1440 08/09/21 ?0427 08/10/21 ?0449  ?NA 135 138 141  ?K 4.0 4.0 3.5  ?CL 98 107 109  ?CO2 '25 23 22  '$ ?GLUCOSE 104* 97 94  ?BUN 75* 66* 55*  ?CREATININE 2.56* 2.08* 1.65*  ?CALCIUM 8.9 7.9* 8.2*  ?PHOS  --   --  4.1  ? ? ?CBG: ?No results for input(s): GLUCAP in the last 168 hours. ? ?Recent Results (from the past 240 hour(s))  ?Culture, blood (Routine x 2)     Status: None (Preliminary result)  ? Collection Time: 08/08/21  2:40 PM  ? Specimen: BLOOD RIGHT HAND  ?Result Value Ref Range Status  ? Specimen Description BLOOD RIGHT HAND  Final  ? Special Requests   Final  ?  BOTTLES DRAWN AEROBIC ONLY Blood Culture results may not be optimal due to an inadequate volume of blood received in culture bottles  ? Culture   Final  ?  NO GROWTH 2 DAYS ?Performed at [facility], [address], [place], [place] [address] ?  ? Report Status PENDING  Incomplete  ?Culture, blood (Routine x 2)     Status: Abnormal (Preliminary result)  ? Collection Time: 08/08/21  2:40 PM  ? Specimen: Left Antecubital; Blood  ?Result Value Ref Range Status  ? Specimen Description   Final  ?  LEFT ANTECUBITAL ?Performed at [facility], [address], [place], [place] [address] ?  ? Special Requests   Final  ?  BOTTLES DRAWN AEROBIC AND ANAEROBIC Blood Culture adequate volume ?Performed at [facility], [address], [place], [place] [address] ?  ? Culture  Setup Time   Final  ?  IN BOTH AEROBIC AND ANAEROBIC BOTTLES ?GRAM NEGATIVE RODS ?Gram Stain Report Called to,Read Back By and Verified With:  ERIC FIERKE @ 0622 ON 08/09/21 C VARNER ?CORRECTED RESULTS ?PREVIOUSLY REPORTED AS: GRAM POSITIVE COCCI IN DIPLOS ?CORRECTED RESULTS CALLED TO: [place] [id number] AT 9242 BY CM ?  ? Culture (A)  Final  ?  PROVIDENCIA RETTGERI ?SUSCEPTIBILITIES TO FOLLOW ?Performed at [facility], [place] [address], [place], [place] [address] ?  ? Report Status PENDING  Incomplete  ?Blood Culture ID Panel (Reflexed)     Status: Abnormal  ? Collection Time: 08/08/21  2:40 PM  ?Result Value Ref Range Status  ? Enterococcus faecalis NOT DETECTED NOT DETECTED Final  ? Enterococcus Faecium NOT DETECTED NOT  DETECTED Final  ? Listeria monocytogenes NOT DETECTED NOT DETECTED Final  ? Staphylococcus species NOT DETECTED NOT DETECTED Final  ? Staphylococcus aureus (BCID) NOT DETECTED NOT DETECTED Final  ? Staphylococcus epidermidis NOT DETECTED NOT DETECTED Final  ? Staphylococcus lugdunensis NOT DETECTED NOT DETECTED Final  ? Streptococcus species NOT DETECTED NOT DETECTED Final  ? Streptococcus agalactiae NOT DETECTED NOT DETECTED Final  ? Streptococcus pneumoniae NOT DETECTED NOT DETECTED Final  ? Streptococcus pyogenes NOT DETECTED NOT DETECTED Final  ? A.calcoaceticus-baumannii NOT DETECTED NOT DETECTED Final  ? Bacteroides fragilis NOT DETECTED NOT DETECTED Final  ? Enterobacterales DETECTED (A) NOT DETECTED Final  ?  Comment: Enterobacterales represent a large order of gram negative bacteria, not a single organism. Refer to culture for further identification. ?CRITICAL RESULT CALLED TO, READ BACK BY AND VERIFIED WITH: ?PHARMD LORI Irion [id number] AT 1109 BY CM ?  ? Enterobacter cloacae complex NOT DETECTED NOT DETECTED Final  ? Escherichia coli NOT DETECTED NOT DETECTED Final  ? Klebsiella aerogenes NOT DETECTED NOT DETECTED Final  ? Klebsiella oxytoca NOT DETECTED NOT DETECTED Final  ? Klebsiella pneumoniae NOT DETECTED NOT DETECTED Final  ? Proteus species NOT DETECTED NOT DETECTED Final  ? Salmonella species NOT DETECTED NOT  DETECTED Final  ? Serratia marcescens NOT DETECTED NOT DETECTED Final  ? Haemophilus influenzae NOT DETECTED NOT DETECTED Final  ? Neisseria meningitidis NOT DETECTED NOT DETECTED Final  ? Pseudomonas aeruginosa NO

## 2021-08-10 NOTE — TOC Progression Note (Signed)
Transition of Care (TOC) - Progression Note  ? ? ?Patient Details  ?Name: Veronica Horton ?MRN: [id number] ?Date of Birth: 08-21-1960 ? ?Transition of Care (TOC) CM/SW Contact  ?Shade Flood, LCSW ?Phone Number: ?08/10/2021, 12:40 PM ? ?Clinical Narrative:    ? ?TOC following. Met with pt at bedside today to assess and review dc planning. Pt is alert and oriented x4. Pt states that she is comfortable with returning to her previous living environment. She does not want SNF. Pt states that she won't be having compression wraps at dc. She does state that she needs more dressing supplies and that her [facility] agency is working on getting them. ? ?Updated pt's [place] So[employer] scientist. MD anticipating dc in a few days. Will refer to APS at dc. Will ask MD for RN and SW HH orders at dc as well. ? ?TOC will follow. ?  ?Barriers to Discharge: Continued Medical Work up ? ?Expected Discharge Plan and Services ?  ?In-house Referral: Clinical Social Work ?  ?Post Acute Care Choice: Resumption of Svcs/PTA Provider ?Living arrangements for the past 2 months: [place] ?                ?  ?  ?  ?  ?  ?  ?  ?  ?  ?  ? ? ?Social Determinants of Health (SDOH) Interventions ?  ? ?Readmission Risk Interventions ? ?  08/09/2021  ?  2:37 PM 04/26/2020  ? 10:28 AM 01/20/2019  ? 11:57 AM  ?Readmission Risk Prevention Plan  ?Medication Screening   Complete  ?Transportation Screening Complete Complete Complete  ?Medication Review Press photographer) Complete Complete   ?PCP or Specialist appointment within 3-5 days of discharge  Complete   ?[facility] or Home Care Consult Complete Complete   ?SW Recovery Care/Counseling Consult Complete Complete   ?Palliative Care Screening Not Applicable Not Applicable   ?[place] Not Applicable Patient Refused   ? ? ?

## 2021-08-11 DIAGNOSIS — J449 Chronic obstructive pulmonary disease, unspecified: Secondary | ICD-10-CM | POA: Diagnosis not present

## 2021-08-11 DIAGNOSIS — I10 Essential (primary) hypertension: Secondary | ICD-10-CM | POA: Diagnosis not present

## 2021-08-11 DIAGNOSIS — I872 Venous insufficiency (chronic) (peripheral): Secondary | ICD-10-CM | POA: Diagnosis not present

## 2021-08-11 DIAGNOSIS — L0390 Cellulitis, unspecified: Secondary | ICD-10-CM | POA: Diagnosis not present

## 2021-08-11 LAB — CULTURE, BLOOD (ROUTINE X 2): Special Requests: ADEQUATE

## 2021-08-11 MED ORDER — LORAZEPAM 2 MG/ML IJ SOLN: 2.0000 mg | INTRAMUSCULAR | Status: DC | PRN

## 2021-08-11 MED ORDER — LEVETIRACETAM 500 MG PO TABS
750.0000 mg | ORAL_TABLET | Freq: Two times a day (BID) | ORAL | Status: DC
  Administered 2021-08-11 – 2021-08-13 (×4): 750 mg via ORAL
  Filled 2021-08-11 (×4): qty 1

## 2021-08-11 NOTE — Progress Notes (Signed)
Dressings changed to BLE.  ?

## 2021-08-11 NOTE — Progress Notes (Signed)
?PROGRESS NOTE ? ? ?Veronica Horton  HCW:[id number] DOB: 14-Feb-1961 DOA: 08/08/2021 ?PCP: [facility]  ? ?Chief Complaint  ?Patient presents with  ? Hypotension  ? ?Level of care: Telemetry ? ?Brief Admission History:  ?61 y.o. 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  ?Assessment and Plan: ? ?1) sepsis secondary to bilateral cellulitis superimposed on chronic venous insufficiency--- ?-Continue IV Rocephin ?-No fevers or leukocytosis at this time ?  ?2) H/O stage IV laryngeal cancer s/p tracheostomy with chronic hypoxic respiratory failure--- continue oxygen supplementation and bronchodilators ?  ?3) COPD with ongoing tobacco use despite being on home O2--- associated risk of fire and death discussed with patient ?-bronchodilators as above #2 ?  ?4)Seizure disorder with h/o meningioma ?-Stable, continue Keppra. ?  ?5)Morbid Obesity- ?-Low calorie diet, portion control discussed with patient ?-Body mass index is 43.4 kg/m?. ?  ?6)H/o DVT--continue Eliquis ?  ?7) acute anemia--- while on Eliquis ?-Hgb is down to 8.6 from 11.2 suspect hemodilution ?-No frank bleeding noted continue to monitor closely ?  ?8)Social/Ethics---  [facility] HH director, Angie, states that they are active with pt for RN at home. She states pt has Home O2 and a trach. They see pt for dressing changes and pt's sig other and sister are supposed to be doing the care on days [facility] isn't there.   Angie reports having concerns about pt's care at home and also the living conditions. She states that pt and her sig other live in the mobile home belonging to pt's elderly aunt. She reports that there were insects crawling on pt's legs  when she arrived to see pt yesterday. Pt has not had HH SW or APS involved up to this point.  ?-TOC consult requested ?-Patient is a full code ?  ?Disposition/Need for in-Hospital Stay- patient unable to be discharged at this time due to --cellulitis requiring IV antibiotics, may need placement of facility* ?  ?Status is: Inpatient  ?  ?Disposition: The patient is from: Home ?             Anticipated d/c is to:  Home with HH, pt is declining SNF at this time   ?             Anticipated d/c date is: 2 days ?             Patient currently is not medically stable to d/c. ? ?DVT prophylaxis: apixaban  ?Code Status: Full  ?Family Communication:  ?Disposition: Status is: Inpatient ?Remains inpatient appropriate because: continue to require IV antibiotic therapy  ?  ?Consultants:  ?wound ?Procedures:  ? ?Antimicrobials:  ?Ceftriaxone 4/11>>  ?Subjective: ?Pt reports wanting to start working with PT today. Pt says pain is controlled.   ?Objective: ?Vitals:  ? 08/11/21 0425 08/11/21 0830 08/11/21 1215 08/11/21 1424  ?BP: (!) 121/54   (!) 120/59  ?Pulse: 72 68 65 (!) 58  ?Resp: '18 18 18 17  '$ ?Temp: 98.7 ?F (37.1 ?C)   98.6 ?F (37 ?C)  ?TempSrc:    Oral  ?SpO2: 98% 97% 95% 99%  ?Weight:      ?Height:      ? ? ?Intake/Output Summary (Last 24 hours) at 08/11/2021 1444 ?Last  data filed at 08/11/2021 1134 ?Gross per 24 hour  ?Intake 995.72 ml  ?Output 2150 ml  ?Net -1154.28 ml  ? ?Filed Weights  ? 08/08/21 1450 08/08/21 2300 08/09/21 0500  ?Weight: 121.9 kg 118.6 kg 118.3 kg  ? ?Examination: ? ?General exam: Appears calm and comfortable  ?Respiratory system: Clear to auscultation. Respiratory effort normal. ?Cardiovascular system: normal S1 & S2 heard. No JVD, murmurs, rubs, gallops or clicks. No pedal edema. ?Gastrointestinal system: Abdomen is nondistended, soft and nontender. No organomegaly or masses felt. Normal bowel sounds heard. ?Central nervous system: Alert and oriented. No focal neurological deficits. ?Extremities: chronic  lymphedema BLEs.  Symmetric 5 x 5 power. ?Skin: diffuse erythema consistent with cellulitis bilateral LEs slightly improved and wrinkling starting to be seen in skin. ?Psychiatry: Judgement and insight appear normal. Mood & affect appropriate.  ? ?Data Reviewed: I have personally reviewed following labs and imaging studies ? ?CBC: ?Recent Labs  ?Lab 08/08/21 ?1440 08/09/21 ?0427 08/10/21 ?0449  ?WBC 9.6 7.8 8.8  ?NEUTROABS 9.0*  --   --   ?HGB 11.2* 8.6* 8.6*  ?HCT 34.6* 28.4* 27.2*  ?MCV 92.3 94.4 94.1  ?PLT 208 147* 177  ? ? ?Basic Metabolic Panel: ?Recent Labs  ?Lab 08/08/21 ?1440 08/09/21 ?0427 08/10/21 ?0449  ?NA 135 138 141  ?K 4.0 4.0 3.5  ?CL 98 107 109  ?CO2 '25 23 22  '$ ?GLUCOSE 104* 97 94  ?BUN 75* 66* 55*  ?CREATININE 2.56* 2.08* 1.65*  ?CALCIUM 8.9 7.9* 8.2*  ?PHOS  --   --  4.1  ? ? ?CBG: ?No results for input(s): GLUCAP in the last 168 hours. ? ?Recent Results (from the past 240 hour(s))  ?Culture, blood (Routine x 2)     Status: None (Preliminary result)  ? Collection Time: 08/08/21  2:40 PM  ? Specimen: BLOOD RIGHT HAND  ?Result Value Ref Range Status  ? Specimen Description BLOOD RIGHT HAND  Final  ? Special Requests   Final  ?  BOTTLES DRAWN AEROBIC ONLY Blood Culture results may not be optimal due to an inadequate volume of blood received in culture bottles  ? Culture   Final  ?  NO GROWTH 3 DAYS ?Performed at [facility], [address] [place], [place] [address] ?  ? Report Status PENDING  Incomplete  ?Culture, blood (Routine x 2)     Status: Abnormal  ? Collection Time: 08/08/21  2:40 PM  ? Specimen: Left Antecubital; Blood  ?Result Value Ref Range Status  ? Specimen Description   Final  ?  LEFT ANTECUBITAL ?Performed at [facility], [name] [place], [place] [address] ?  ? Special Requests   Final  ?  BOTTLES DRAWN AEROBIC AND ANAEROBIC Blood Culture adequate volume ?Performed at [facility], [address] [name], [place] [address] ?  ? Culture  Setup Time   Final  ?  IN BOTH AEROBIC  AND ANAEROBIC BOTTLES ?GRAM NEGATIVE RODS ?Gram Stain Report Called to,Read Back By and Verified With: ERIC FIERKE @ 0622 ON 08/09/21 C VARNER ?CORRECTED RESULTS ?PREVIOUSLY REPORTED AS: GRAM POSITIVE COCCI IN DIPLOS ?CORRECTED RESULTS CALLED TO: [place] [id number] AT 3491 BY CM ?Performed at [facility], [place] [address] [place],  [address] ?  ? Culture PROVIDENCIA RETTGERI (A)  Final  ? Report Status 08/11/2021 FINAL  Final  ? Organism ID, Bacteria PROVIDENCIA RETTGERI  Final  ?    Susceptibility  ? Providencia rettgeri - MIC*  ?  AMPICILLIN  RESISTANT Resistant   ?  CEFAZOLIN RESISTANT Resistant   ?  CEFEPIME <=0.12 SENSITIVE Sensitive   ?  CEFTAZIDIME <=1 SENSITIVE Sensitive   ?  CEFTRIAXONE <=0.25 SENSITIVE Sensitive   ?  CIPROFLOXACIN <=0.25 SENSITIVE Sensitive   ?  GENTAMICIN <=1 SENSITIVE Sensitive   ?  IMIPENEM 1 SENSITIVE Sensitive   ?  TRIMETH/SULFA <=20 SENSITIVE Sensitive   ?  AMPICILLIN/SULBACTAM <=2 SENSITIVE Sensitive   ?  PIP/TAZO <=4 SENSITIVE Sensitive   ?  * PROVIDENCIA RETTGERI  ?Blood Culture ID Panel (Reflexed)     Status: Abnormal  ? Collection Time: 08/08/21  2:40 PM  ?Result Value Ref Range Status  ? Enterococcus faecalis NOT DETECTED NOT DETECTED Final  ? Enterococcus Faecium NOT DETECTED NOT DETECTED Final  ? Listeria monocytogenes NOT DETECTED NOT DETECTED Final  ? Staphylococcus species NOT DETECTED NOT DETECTED Final  ? Staphylococcus aureus (BCID) NOT DETECTED NOT DETECTED Final  ? Staphylococcus epidermidis NOT DETECTED NOT DETECTED Final  ? Staphylococcus lugdunensis NOT DETECTED NOT DETECTED Final  ? Streptococcus species NOT DETECTED NOT DETECTED Final  ? Streptococcus agalactiae NOT DETECTED NOT DETECTED Final  ? Streptococcus pneumoniae NOT DETECTED NOT DETECTED Final  ? Streptococcus pyogenes NOT DETECTED NOT DETECTED Final  ? A.calcoaceticus-baumannii NOT DETECTED NOT DETECTED Final  ? Bacteroides fragilis NOT DETECTED NOT DETECTED Final  ? Enterobacterales  DETECTED (A) NOT DETECTED Final  ?  Comment: Enterobacterales represent a large order of gram negative bacteria, not a single organism. Refer to culture for further identification. ?CRITICAL RESULT CALLE

## 2021-08-11 NOTE — Progress Notes (Signed)
Per MD Zierle-Ghosh received order to give patient her 750 mg Keppra now instead of at 2200.  ?

## 2021-08-11 NOTE — Progress Notes (Signed)
Patient had a seizure. Lasted two minutes. Eyes open during seizure. Patient was able to talk during seizure. Small shaking during seizure. No PRN orders at this time. MD Zierle-Ghosh notified.  ?

## 2021-08-12 ENCOUNTER — Ambulatory Visit: Payer: Self-pay | Admitting: Radiation Oncology

## 2021-08-12 DIAGNOSIS — I10 Essential (primary) hypertension: Secondary | ICD-10-CM | POA: Diagnosis not present

## 2021-08-12 DIAGNOSIS — L0390 Cellulitis, unspecified: Secondary | ICD-10-CM | POA: Diagnosis not present

## 2021-08-12 DIAGNOSIS — I872 Venous insufficiency (chronic) (peripheral): Secondary | ICD-10-CM | POA: Diagnosis not present

## 2021-08-12 DIAGNOSIS — J449 Chronic obstructive pulmonary disease, unspecified: Secondary | ICD-10-CM | POA: Diagnosis not present

## 2021-08-12 MED ORDER — LEVOTHYROXINE SODIUM 75 MCG PO TABS
150.0000 ug | ORAL_TABLET | Freq: Every day | ORAL | Status: DC
  Administered 2021-08-12 – 2021-08-13 (×2): 150 ug via ORAL
  Filled 2021-08-12 (×2): qty 2

## 2021-08-12 NOTE — Plan of Care (Signed)
?  Problem: Acute Rehab PT Goals(only PT should resolve) ?Goal: Pt Will Go Supine/Side To Sit ?Outcome: Progressing ?Flowsheets (Taken 08/12/2021 1008) ?Pt will go Supine/Side to Sit: ? with minimal assist ? with min guard assist ?Goal: Pt Will Go Sit To Supine/Side ?Outcome: Progressing ?Flowsheets (Taken 08/12/2021 1008) ?Pt will go Sit to Supine/Side: ? with min guard assist ? with minimal assist ?Goal: Patient Will Transfer Sit To/From Stand ?Outcome: Progressing ?Flowsheets (Taken 08/12/2021 1008) ?Patient will transfer sit to/from stand: with minimal assist ?Goal: Pt Will Transfer Bed To Chair/Chair To Bed ?Outcome: Progressing ?Flowsheets (Taken 08/12/2021 1008) ?Pt will Transfer Bed to Chair/Chair to Bed: ? with min assist ? with mod assist ?Goal: Pt Will Ambulate ?Outcome: Progressing ?Flowsheets (Taken 08/12/2021 1008) ?Pt will Ambulate: ? 10 feet ? with least restrictive assistive device ? with moderate assist ?Goal: Pt/caregiver will Perform Home Exercise Program ?Outcome: Progressing ?Flowsheets (Taken 08/12/2021 1008) ?Pt/caregiver will Perform Home Exercise Program: ? For increased strengthening ? For improved balance ? Independently ? 10:09 AM, 08/12/21 ?Mearl Latin PT, DPT ?Physical Therapist at [facility] ?[facility] ? ?

## 2021-08-12 NOTE — Evaluation (Signed)
Physical Therapy Evaluation ?Patient Details ?Name: Veronica Horton ?MRN: [id number] ?DOB: 11/16/1960 ?Today's Date: 08/12/2021 ? ?History of Present Illness ? Veronica Horton is a 61 y.o. 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 Right leg looks at baseline, but the left leg looks more erythematous per her significant other. She was noted to be drowsy by EDP, but boyfriend relates that she has her "days and nights backwards." She also fell asleep and slid out of her chair but no injuries reported from the fall. Fell onto sacral area. Veronica Horton is unable to provide much additional history secondary to lethargy.  She wakens enough to answer simple questions before falling back to sleep.  Denies SOB.  Says her legs look "worse" than they normally do, and that they "hurt." No family is at the bedside. ? ?  ?Clinical Impression ? Patient limited for functional mobility as stated below secondary to BLE weakness, fatigue and poor standing balance. Patient requires increased time for bed mobility with heavy use of bed rails and assist. She demonstrates good sitting tolerance EOB. Patient requires mod/max assist for transfers and very short distance ambulation due to weakness and balance deficit. Patient ends session seated in chair at bedside. Patient will benefit from continued physical therapy in hospital and recommended venue below to increase strength, balance, endurance for safe ADLs and gait. ?   ?   ? ?Recommendations for follow up therapy are one component of a multi-disciplinary discharge planning process,  led by the attending physician.  Recommendations may be updated based on patient status, additional functional criteria and insurance authorization. ? ?Follow Up Recommendations Skilled nursing-short term rehab (<3 hours/day) ? ?  ?Assistance Recommended at Discharge Frequent or constant Supervision/Assistance  ?Patient can return home with the following ? A lot of help with walking and/or transfers;A lot of help with bathing/dressing/bathroom;Assistance with cooking/housework;Help with stairs or ramp for entrance ? ?  ?Equipment Recommendations None recommended by PT  ?Recommendations for Other Services ?    ?  ?Functional Status Assessment Patient has had a recent decline in their functional status and demonstrates the ability to make significant improvements in function in a reasonable and predictable amount of time.  ? ?  ?Precautions / Restrictions Precautions ?Precautions: Fall ?Restrictions ?Weight Bearing Restrictions: No  ? ?  ? ?Mobility ? Bed Mobility ?Overal bed mobility: Needs Assistance ?Bed Mobility: Supine to Sit ?  ?  ?Supine to sit: Mod assist, HOB elevated ?  ?  ?General bed mobility comments: slow, labored, cueing for sequencing, assist for weakness ?  ? ?Transfers ?Overall transfer level: Needs assistance ?Equipment used: 1 person hand held assist ?Transfers: Sit to/from Stand, Bed to chair/wheelchair/BSC ?Sit to Stand: Mod assist, Max assist ?  ?Step pivot transfers: Mod assist, Max assist ?  ?  ?  ?General transfer comment: labored, requires several attempts and assist to power up from seated, unable to stand fully upright, use of hands on chair/bed/walls/ HHA for support ?  ? ?Ambulation/Gait ?Ambulation/Gait assistance: Mod assist ?Gait Distance (Feet): 2 Feet ?Assistive device: 1 person hand held assist ?Gait Pattern/deviations: Wide base of support,  Shuffle ?  ?  ?  ?General Gait Details: ambulates a few feet to chair at bedside ? ?Stairs ?  ?  ?  ?  ?  ? ?Wheelchair Mobility ?   ? ?Modified Rankin (Stroke Patients Only) ?  ? ?  ? ?Balance Overall balance assessment: Needs assistance ?Sitting-balance support: Bilateral upper extremity supported ?Sitting balance-Leahy Scale: Fair ?Sitting balance - Comments: seated EOB ?  ?Standing balance support: Bilateral upper extremity supported, Reliant on assistive device for balance ?Standing balance-Leahy Scale: Poor ?  ?  ?  ?  ?  ?  ?  ?  ?  ?  ?  ?  ?   ? ? ? ?Pertinent Vitals/Pain Pain Assessment ?Pain Assessment: No/denies pain  ? ? ?Home Living Family/patient expects to be discharged to:: Private residence ?Living Arrangements: Other relatives;Spouse/significant other ?Available Help at Discharge: Friend(s);Available 24 hours/day;Family ?Type of Home: Mobile home ?Home Access: Ramped entrance ?  ?  ?  ?Home Layout: One level ?Home Equipment: Conservation officer, nature (2 wheels);Cane - single point;Wheelchair - manual ?   ?  ?Prior Function Prior Level of Function : Needs assist ?  ?  ?  ?  ?  ?  ?Mobility Comments: household ambulation with RW ?ADLs Comments: family assists ?  ? ? ?Hand Dominance  ? Dominant Hand: Right ? ?  ?Extremity/Trunk Assessment  ? Upper Extremity Assessment ?Upper Extremity Assessment: Generalized weakness ?  ? ?Lower Extremity Assessment ?Lower Extremity Assessment: Generalized weakness ?  ? ?Cervical / Trunk Assessment ?Cervical / Trunk Assessment: Normal  ?Communication  ? Communication: Tracheostomy  ?Cognition Arousal/Alertness: Awake/alert ?Behavior During Therapy: [facility] for tasks assessed/performed ?Overall Cognitive Status: Within Functional Limits for tasks assessed ?  ?  ?  ?  ?  ?  ?  ?  ?  ?  ?  ?  ?  ?  ?  ?  ?  ?  ?  ? ?  ?General Comments   ? ?  ?Exercises    ? ?Assessment/Plan  ?  ?PT Assessment Patient needs continued PT services  ?PT Problem List Decreased strength;Decreased mobility;Decreased activity tolerance;Decreased balance;Obesity;Cardiopulmonary status limiting activity;Decreased skin integrity ? ?   ?   ?PT Treatment Interventions DME instruction;Therapeutic exercise;Gait training;Balance training;Stair training;Neuromuscular re-education;Functional mobility training;Therapeutic activities;Patient/family education   ? ?PT Goals (Current goals can be found in the Care Plan section)  ?Acute Rehab PT Goals ?Patient Stated Goal: Return home ?PT Goal Formulation: With patient ?Time For Goal Achievement: 08/26/21 ?Potential to Achieve Goals: Good ? ?  ?Frequency Min 3X/week ?  ? ? ?Co-evaluation   ?  ?  ?  ?  ? ? ?  ?AM-PAC PT "6 Clicks" Mobility  ?Outcome Measure Help needed turning from your back to your side while in a flat bed without using bedrails?: A Lot ?Help needed moving from lying on your back to sitting on the side of a flat bed without using bedrails?: A Lot ?Help needed moving to and from a bed to a chair (including a wheelchair)?: A Lot ?Help needed standing up from a chair using your arms (e.g., wheelchair or bedside chair)?: A Lot ?Help needed to walk in hospital room?: A Lot ?Help needed climbing 3-5 steps with a railing? : Total ?6 Click Score: 11 ? ?  ?End of Session   ?Activity Tolerance: Patient tolerated treatment well ?Patient left: in bed;with call bell/phone within reach;with nursing/sitter in room ?Nurse Communication: Mobility status ?PT Visit Diagnosis: Unsteadiness on feet (R26.81);Other abnormalities  of gait and mobility (R26.89);Muscle weakness (generalized) (M62.81) ?  ? ?Time: 1027-2536 ?PT Time Calculation (min) (ACUTE ONLY): 18 min ? ? ?Charges:   PT Evaluation ?$PT Eval Moderate Complexity: 1 Mod ?PT Treatments ?$Therapeutic Activity: 8-22 mins ?  ?   ? ? ?10:05 AM, 08/12/21 ?Mearl Latin PT, DPT ?Physical Therapist at [facility] ?[facility] ? ? ?

## 2021-08-12 NOTE — Progress Notes (Signed)
Changed to regular trach aerosol set up  -- 28% oxygen at 6 liters --saturation 96 ?

## 2021-08-12 NOTE — Progress Notes (Signed)
?PROGRESS NOTE ? ? ?Veronica Horton  IFO:[id number] DOB: [birth year] DOA: 08/08/2021 ?PCP: [facility]  ? ?Chief Complaint  ?Patient presents with  ? Hypotension  ? ?Level of care: Telemetry ? ?Brief Admission History:  ?61 y.o. 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  ?Assessment and Plan: ? ?1) sepsis secondary to bilateral cellulitis superimposed on chronic venous insufficiency---Sepsis physiology has resolved.  ?-Continue IV Rocephin ?-No fevers or leukocytosis.  Pt continues to improve on therapy. Cellulitis improving. Anticipate needs 1 more day of IV antibiotic and can transition to oral antibiotics starting 4/15 and likely discharge home.   ?  ?2) H/O stage IV laryngeal cancer s/p tracheostomy with chronic hypoxic respiratory failure--- continue oxygen supplementation and bronchodilators ?  ?3) COPD with ongoing tobacco use despite being on home O2--- associated risk of fire and death discussed with patient ?-bronchodilators as above #2 ?  ?4)Seizure disorder with h/o meningioma ?-Stable, continue Keppra. ?  ?5)Morbid Obesity- ?-Low calorie diet, portion control discussed with patient ?-Body mass index is 43.4 kg/m?. ?  ?6)H/o DVT--continue Eliquis ?  ?7) acute anemia--- while on Eliquis ?-Hgb is down to 8.6 from 11.2 suspect hemodilution ?-No frank bleeding noted continue to monitor closely ?  ?8)Social/Ethics---  [facility] director, Angie, states that they are active with pt for RN at home. She states pt has Home O2 and a trach. They see pt for dressing changes and pt's sig other and sister are supposed to be doing the care on days [facility] isn't there.   Angie reports having concerns  about pt's care at home and also the living conditions. She states that pt and her sig other live in the mobile home belonging to pt's elderly aunt. She reports that there were insects crawling on pt's legs when she arrived to see pt yesterday. Pt has not had HH SW or APS involved up to this point.  ?-TOC consult requested ?-Patient is a full code ?  ?Disposition/Need for in-Hospital Stay- patient unable to be discharged at this time due to --cellulitis requiring IV antibiotics, may need placement of facility* ?  ?Status is: Inpatient  ?  ?Disposition: The patient is from: Home ?             Anticipated d/c is to:  Home with HH, pt is declining SNF at this time   ?             Anticipated d/c date is: 1 days ?             Patient currently is not medically stable to d/c. Needs one more day of IV antibiotics.   ? ?DVT prophylaxis: apixaban  ?Code Status: Full  ?Family Communication:  ?Disposition: Status is: Inpatient ?Remains inpatient appropriate because: continue to require IV antibiotic therapy  ?  ?Consultants:  ?wound ?Procedures:  ? ?Antimicrobials:  ?Ceftriaxone 4/11>>  ?Subjective: ?Pt reports wanting to start working with PT today. Pt says pain is controlled.   ?Objective: ?Vitals:  ? 08/12/21 0455 08/12/21 0826 08/12/21 1015 08/12/21 1229  ?BP: (!) 126/57   134/62  ?Pulse: (!) 55   76  ?Resp:    20  ?Temp:    98.4 ?F (36.9 ?  C)  ?TempSrc:    Oral  ?SpO2: 98% 93% 93% 96%  ?Weight:      ?Height:      ? ? ?Intake/Output Summary (Last 24 hours) at 08/12/2021 1600 ?Last data filed at 08/12/2021 1100 ?Gross per 24 hour  ?Intake 240 ml  ?Output 1400 ml  ?Net -1160 ml  ? ?Filed Weights  ? 08/08/21 1450 08/08/21 2300 08/09/21 0500  ?Weight: 121.9 kg 118.6 kg 118.3 kg  ? ?Examination: ? ?General exam: Appears calm and comfortable  ?Respiratory system: Clear to auscultation. Respiratory effort normal. ?Cardiovascular system: normal S1 & S2 heard. No JVD, murmurs, rubs, gallops or clicks. No pedal  edema. ?Gastrointestinal system: Abdomen is nondistended, soft and nontender. No organomegaly or masses felt. Normal bowel sounds heard. ?Central nervous system: Alert and oriented. No focal neurological deficits. ?Extremities: chronic lymphedema BLEs.  Symmetric 5 x 5 power. ?Skin: diffuse erythema consistent with cellulitis bilateral LE continues improvement however still areas of bright red streaks and wrinkling starting to be seen in skin. ?Psychiatry: Judgement and insight appear normal. Mood & affect appropriate.  ? ?Data Reviewed: I have personally reviewed following labs and imaging studies ? ?CBC: ?Recent Labs  ?Lab 08/08/21 ?1440 08/09/21 ?0427 08/10/21 ?0449  ?WBC 9.6 7.8 8.8  ?NEUTROABS 9.0*  --   --   ?HGB 11.2* 8.6* 8.6*  ?HCT 34.6* 28.4* 27.2*  ?MCV 92.3 94.4 94.1  ?PLT 208 147* 177  ? ? ?Basic Metabolic Panel: ?Recent Labs  ?Lab 08/08/21 ?1440 08/09/21 ?0427 08/10/21 ?0449  ?NA 135 138 141  ?K 4.0 4.0 3.5  ?CL 98 107 109  ?CO2 '25 23 22  '$ ?GLUCOSE 104* 97 94  ?BUN 75* 66* 55*  ?CREATININE 2.56* 2.08* 1.65*  ?CALCIUM 8.9 7.9* 8.2*  ?PHOS  --   --  4.1  ? ? ?CBG: ?No results for input(s): GLUCAP in the last 168 hours. ? ?Recent Results (from the past 240 hour(s))  ?Culture, blood (Routine x 2)     Status: None (Preliminary result)  ? Collection Time: 08/08/21  2:40 PM  ? Specimen: BLOOD RIGHT HAND  ?Result Value Ref Range Status  ? Specimen Description BLOOD RIGHT HAND  Final  ? Special Requests   Final  ?  BOTTLES DRAWN AEROBIC ONLY Blood Culture results may not be optimal due to an inadequate volume of blood received in culture bottles  ? Culture   Final  ?  NO GROWTH 4 DAYS ?Performed at [facility], [address], [place], [place] [address] ?  ? Report Status PENDING  Incomplete  ?Culture, blood (Routine x 2)     Status: Abnormal  ? Collection Time: 08/08/21  2:40 PM  ? Specimen: Left Antecubital; Blood  ?Result Value Ref Range Status  ? Specimen Description   Final  ?  LEFT ANTECUBITAL ?Performed  at [facility], [address], [place], [place] [address] ?  ? Special Requests   Final  ?  BOTTLES DRAWN AEROBIC AND ANAEROBIC Blood Culture adequate volume ?Performed at [facility], [address], [place], [place] [address] ?  ? Culture  Setup Time   Final  ?  IN BOTH AEROBIC AND ANAEROBIC BOTTLES ?GRAM NEGATIVE RODS ?Gram Stain Report Called to,Read Back By and Verified With: ERIC FIERKE @ 0622 ON 08/09/21 C VARNER ?CORRECTED RESULTS ?PREVIOUSLY REPORTED AS: GRAM POSITIVE COCCI IN DIPLOS ?CORRECTED RESULTS CALLED TO: Parker [id number] AT 0258 BY CM ?Performed at [facility], [place],  [place] [address] ?  ? Culture PROVIDENCIA RETTGERI (A)  Final  ? Report Status 08/11/2021 FINAL  Final  ? Organism ID, Bacteria PROVIDENCIA RETTGERI  Final  ?    Susceptibility  ? Providencia rettgeri - MIC*  ?  AMPICILLIN RESISTANT Resistant   ?  CEFAZOLIN RESISTANT Resistant   ?  CEFEPIME <=0.12 SENSITIVE Sensitive   ?  CEFTAZIDIME <=1 SENSITIVE Sensitive   ?  CEFTRIAXONE <=0.25 SENSITIVE Sensitive   ?  CIPROFLOXACIN <=0.25 SENSITIVE Sensitive   ?  GENTAMICIN <=1 SENSITIVE Sensitive   ?  IMIPENEM 1 SENSITIVE Sensitive   ?  TRIMETH/SULFA <=20 SENSITIVE Sensitive   ?  AMPICILLIN/SULBACTAM <=2 SENSITIVE Sensitive   ?  PIP/TAZO <=4 SENSITIVE Sensitive   ?  * PROVIDENCIA RETTGERI  ?Blood Culture ID Panel (Reflexed)     Status: Abnormal  ? Collection Time: 08/08/21  2:40 PM  ?Result Value Ref Range Status  ? Enterococcus faecalis NOT DETECTED NOT DETECTED Final  ? Enterococcus Faecium NOT DETECTED NOT DETECTED Final  ? Listeria monocytogenes NOT DETECTED NOT DETECTED Final  ? Staphylococcus species NOT DETECTED NOT DETECTED Final  ? Staphylococcus aureus (BCID) NOT DETECTED NOT DETECTED Final  ? Staphylococcus epidermidis NOT DETECTED NOT DETECTED Final  ? Staphylococcus lugdunensis NOT DETECTED NOT DETECTED Final  ? Streptococcus species NOT DETECTED NOT DETECTED Final  ? Streptococcus agalactiae NOT  DETECTED NOT DETECTED Final  ? Streptococcus pneumoniae NOT DETECTED NOT DETECTED Final  ? Streptococcus pyogenes NOT DETECTED NOT DETECTED Final  ? A.calcoaceticus-baumannii NOT DETECTED NOT DETECTED Final  ? Bacteroides fragilis

## 2021-08-12 NOTE — TOC Progression Note (Addendum)
Transition of Care (TOC) - Progression Note  ? ? ?Patient Details  ?Name: Veronica Horton ?MRN: [id number] ?Date of Birth: [birth year] ? ?Transition of Care (TOC) CM/SW Contact  ?Shade Flood, LCSW ?Phone Number: ?08/12/2021, 3:29 PM ? ?Clinical Narrative:    ? ?TOC following. Updated by RN that pt needing max assist with DME back to bed today. RN states she strongly encouraged pt to consider SNF rehab at dc but pt refused. TOC also spoke with pt about rehab again and she is continuing to refuse. Pt states she wants to go home and try it at home and that if she cannot manage, she will call her PCP to get her into [facility]. TOC spoke with pt's sister and pt's son to update. Son is going to try and convince pt to go to rehab but when he called her earlier, she would not have a conversation about it.  ? ?Weekend TOC will follow up with pt again prior to dc but if she continues to refuse rehab referral, EMS transport will be arranged to take her home. Pt is active with [facility] and resumption orders can be placed.  ? ?[place] orders are already entered. Contacted Angie at [facility] to update on above. Faxed orders and clinical to Angie today. Her cell number is [phone]. She can be contacted over the weekend for dc. TOC will update HH prior to dc. ? ?  ?Barriers to Discharge: Continued Medical Work up ? ?Expected Discharge Plan and Services ?  ?In-house Referral: Clinical Social Work ?  ?Post Acute Care Choice: Resumption of Svcs/PTA Provider ?Living arrangements for the past 2 months: [place] ?                ?  ?  ?  ?  ?  ?  ?  ?  ?  ?  ? ? ?Social Determinants of Health (SDOH) Interventions ?  ? ?Readmission Risk Interventions ? ?  08/09/2021  ?  2:37 PM 04/26/2020  ? 10:28 AM 01/20/2019  ? 11:57 AM  ?Readmission Risk Prevention Plan  ?Medication Screening   Complete  ?Transportation Screening Complete Complete Complete  ?Medication Review Press photographer) Complete Complete   ?PCP or Specialist  appointment within 3-5 days of discharge  Complete   ?[place] or Home Care Consult Complete Complete   ?SW Recovery Care/Counseling Consult Complete Complete   ?Palliative Care Screening Not Applicable Not Applicable   ?[place] Not Applicable Patient Refused   ? ? ?

## 2021-08-13 DIAGNOSIS — Z930 Tracheostomy status: Secondary | ICD-10-CM

## 2021-08-13 LAB — CULTURE, BLOOD (ROUTINE X 2): Culture: NO GROWTH

## 2021-08-13 MED ORDER — MAGIC MOUTHWASH W/LIDOCAINE: 10.0000 mL | Freq: Three times a day (TID) | ORAL | 0 refills | Status: DC | PRN

## 2021-08-13 MED ORDER — DEXAMETHASONE 4 MG PO TABS: 4.0000 mg | ORAL_TABLET | Freq: Every day | ORAL | Status: DC

## 2021-08-13 MED ORDER — OXYCODONE HCL 5 MG PO TABS: 5.0000 mg | ORAL_TABLET | Freq: Four times a day (QID) | ORAL | 0 refills | Status: AC | PRN

## 2021-08-13 MED ORDER — FUROSEMIDE 40 MG PO TABS: 40.0000 mg | ORAL_TABLET | Freq: Two times a day (BID) | ORAL | Status: DC

## 2021-08-13 MED ORDER — DOXYCYCLINE HYCLATE 100 MG PO CAPS
100.0000 mg | ORAL_CAPSULE | Freq: Two times a day (BID) | ORAL | 0 refills | Status: AC
Start: 2021-08-13 — End: 2021-08-17

## 2021-08-13 MED ORDER — GERHARDT'S BUTT CREAM: 1.0000 "application " | TOPICAL_CREAM | Freq: Three times a day (TID) | CUTANEOUS | 2 refills | Status: AC | PRN

## 2021-08-13 NOTE — Progress Notes (Signed)
Patient is upset that EMS isn't here to take her home. Staff has explained to her since this morning when it comes to EMS transport its on there time. And the can come when they have time and don't have emergent calls. Patient stated that she wanted to borrow an oxygen tank and she would have someone pick her up. This nurse educated patient that it wouldn't be safe for her and we wouldn't want her to fall d/t staff having difficulty putting her to bed yesterday. Patient then spoke to charge nurse and stated one of her girlfriends are on the way to pick her up now and coming with an oxygen tank.Marland Kitchen  ?

## 2021-08-13 NOTE — Discharge Instructions (Signed)
IMPORTANT INFORMATION: PAY CLOSE ATTENTION   PHYSICIAN DISCHARGE INSTRUCTIONS  Follow with Primary care provider  [facility]  and other consultants as instructed by your Hospitalist Physician  SEEK MEDICAL CARE OR RETURN TO EMERGENCY ROOM IF SYMPTOMS COME BACK, WORSEN OR NEW PROBLEM DEVELOPS   Please note: You were cared for by a hospitalist during your hospital stay. Every effort will be made to forward records to your primary care provider.  You can request that your primary care provider send for your hospital records if they have not received them.  Once you are discharged, your primary care physician will handle any further medical issues. Please note that NO REFILLS for any discharge medications will be authorized once you are discharged, as it is imperative that you return to your primary care physician (or establish a relationship with a primary care physician if you do not have one) for your post hospital discharge needs so that they can reassess your need for medications and monitor your lab values.  Please get a complete blood count and chemistry panel checked by your Primary MD at your next visit, and again as instructed by your Primary MD.  Get Medicines reviewed and adjusted: Please take all your medications with you for your next visit with your Primary MD  Laboratory/radiological data: Please request your Primary MD to go over all hospital tests and procedure/radiological results at the follow up, please ask your primary care provider to get all Hospital records sent to his/her office.  In some cases, they will be blood work, cultures and biopsy results pending at the time of your discharge. Please request that your primary care provider follow up on these results.  If you are diabetic, please bring your blood sugar readings with you to your follow up appointment with primary care.    Please call and make your follow up appointments as soon as  possible.    Also Note the following: If you experience worsening of your admission symptoms, develop shortness of breath, life threatening emergency, suicidal or homicidal thoughts you must seek medical attention immediately by calling 911 or calling your MD immediately  if symptoms less severe.  You must read complete instructions/literature along with all the possible adverse reactions/side effects for all the Medicines you take and that have been prescribed to you. Take any new Medicines after you have completely understood and accpet all the possible adverse reactions/side effects.   Do not drive when taking Pain medications or sleeping medications (Benzodiazepines)  Do not take more than prescribed Pain, Sleep and Anxiety Medications. It is not advisable to combine anxiety,sleep and pain medications without talking with your primary care practitioner  Special Instructions: If you have smoked or chewed Tobacco  in the last 2 yrs please stop smoking, stop any regular Alcohol  and or any Recreational drug use.  Wear Seat belts while driving.  Do not drive if taking any narcotic, mind altering or controlled substances or recreational drugs or alcohol.       

## 2021-08-13 NOTE — TOC Transition Note (Addendum)
Transition of Care (TOC) - CM/SW Discharge Note ? ? ?Patient Details  ?Name: Veronica Horton ?MRN: [id number] ?Date of Birth: [birth year] ? ?Transition of Care (TOC) CM/SW Contact:  ?Iona Beard, LCSWA ?Phone Number: ?08/13/2021, 11:21 AM ? ? ?Clinical Narrative:    ?CSW reached out to [employer] with [employer] who will work on getting a shower chair ordered for pt at pts request. CSW explained that pt will dc today and it can be drop shipped to pts home. CSW to send DC summary to [facility] and update that pt is discharging. Angie with [facility] updated on pts discharge. TOC signing off.  ? ?  ?Barriers to Discharge: Continued Medical Work up ? ? ?Patient Goals and CMS Choice ?Patient states their goals for this hospitalization and ongoing recovery are:: go home ?  ?  ? ?Discharge Placement ?  ?           ?  ?  ?  ?  ? ?Discharge Plan and Services ?In-house Referral: Clinical Social Work ?  ?Post Acute Care Choice: Resumption of Svcs/PTA Provider          ?  ?  ?  ?  ?  ?  ?  ?  ?  ?  ? ?Social Determinants of Health (SDOH) Interventions ?  ? ? ?Readmission Risk Interventions ? ?  08/09/2021  ?  2:37 PM 04/26/2020  ? 10:28 AM 01/20/2019  ? 11:57 AM  ?Readmission Risk Prevention Plan  ?Medication Screening   Complete  ?Transportation Screening Complete Complete Complete  ?Medication Review Press photographer) Complete Complete   ?PCP or Specialist appointment within 3-5 days of discharge  Complete   ?[place] or Home Care Consult Complete Complete   ?SW Recovery Care/Counseling Consult Complete Complete   ?Palliative Care Screening Not Applicable Not Applicable   ?[place] Not Applicable Patient Refused   ? ? ? ? ? ?

## 2021-08-13 NOTE — Discharge Summary (Signed)
Physician Discharge Summary  ?Veronica Horton VEH:[id number] DOB: 1960/08/14 DOA: 08/08/2021 ? ?PCP: [facility] ? ?Admit date: 08/08/2021 ?Discharge date: 08/13/2021 ? ?Admitted From:  Home  ?Disposition: Home with HH (absolutely refusing SNF after intensive counseling and recommendations) ? ?Recommendations for Outpatient Follow-up:  ?Follow up with PCP in 1 weeks ?Follow up with pain management clinic as scheduled ? ?Home Health:  PT, RN, Social Work  ? ?Discharge Condition: STABLE   ?CODE STATUS: FULL ?DIET: soft, heart healthy  ? ?Brief Hospitalization Summary: ?Please see all hospital notes, images, labs for full details of the hospitalization. ?Brief Admission History:  ?61 y.o. 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  ?Assessment and Plan: ?  ?1) sepsis secondary to bilateral cellulitis superimposed on chronic venous insufficiency---Sepsis physiology has resolved.  ?-Pt was treated with high dose IV Rocephin ?-No fevers or leukocytosis.  Pt continues to improve on therapy. Cellulitis improving. Transition to oral antibiotics starting 4/15 and discharge home.   ?  ?2) H/O stage IV laryngeal cancer s/p tracheostomy with chronic hypoxic respiratory failure--- continue oxygen supplementation and bronchodilators ?  ?3) COPD with ongoing tobacco use despite being on home O2--- associated risk of fire and death discussed with patient ?-bronchodilators as above #2 ?  ?4)Seizure disorder with h/o meningioma ?-Stable, continue Keppra. ?  ?5)Morbid Obesity- ?-Low calorie diet, portion control discussed with patient ?-Body mass index is 43.4 kg/m?. ?  ?6)H/o DVT--continue Eliquis ?  ?7) acute anemia---  while on Eliquis ?-Hgb is down to 8.6 from 11.2 suspect hemodilution ?-No frank bleeding noted continue to monitor closely ?  ?8)Social/Ethics---  Pt absolutely refusing to go to SNF.  HH has been arranged by [facility].  She requires ambulance transport home due to not being able to ambulate.  She remains high risk for rehospitalization and / or adverse outcome but she adamantly refuses to go to SNF and she does have decisional capacity.   ?-Patient is a full code ?Discharge Diagnoses:  ?Principal Problem: ?  Sepsis due to cellulitis [facility]) ?Active Problems: ?  COPD (chronic obstructive pulmonary disease) ([place]) ?  Hypertension ?  Class 3 obesity ([facility]) ?  Tobacco abuse ?  Tracheostomy status ([place]) ?  Laryngeal cancer ([place]) ?  Chronic respiratory failure ([place]) ?  Seizure disorder ([place]) ?  Chronic venous stasis dermatitis ?  DVT (deep venous thrombosis) ([place]) ? ? ?Discharge Instructions: ? ?Allergies as of 08/13/2021   ? ?   Reactions  ? Codeine Hives, Itching  ? Reports itching only per RN  ? Penicillins Hives  ? Did it involve swelling of the face/tongue/throat, SOB, or low BP? No ?Did it involve sudden or severe rash/hives, skin peeling, or any reaction on the inside of your mouth or nose? Yes ?Did you need to seek medical attention at a hospital or doctor's office? Unknown ?When did it last happen?      Over 10 years ?If all above answers are ?NO?, may proceed with cephalosporin use.  ? ?  ? ?  ?Medication List  ?  ? ?STOP taking these medications   ? ?diclofenac Sodium 1 % Gel ?Commonly known as: VOLTAREN ?  ?diphenhydrAMINE 12.5 MG/5ML elixir ?Commonly known as: BENADRYL ?  ?  feeding supplement (OSMOLITE 1.5 CAL) Liqd ?  ?ketotifen 0.025 % ophthalmic solution ?Commonly known as: ZADITOR ?  ?ondansetron 8 MG tablet ?Commonly known as: ZOFRAN ?  ?potassium chloride SA 20 MEQ tablet ?Commonly known as: KLOR-CON M ?  ?prochlorperazine 10 MG tablet ?Commonly known as: COMPAZINE ?  ?scopolamine 1 MG/3DAYS ?Commonly known as:  TRANSDERM-SCOP ?  ? ?  ? ?TAKE these medications   ? ?apixaban 5 MG Tabs tablet ?Commonly known as: ELIQUIS ?Take 1 tablet (5 mg total) by mouth 2 (two) times daily. Restart taking only after next Monday(8/22) ?  ?arformoterol 15 MCG/2ML Nebu ?Commonly known as: BROVANA ?Take 2 mLs (15 mcg total) by nebulization 2 (two) times daily. ?  ?budesonide 0.5 MG/2ML nebulizer solution ?Commonly known as: PULMICORT ?Take 2 mLs (0.5 mg total) by nebulization 2 (two) times daily. ?  ?dexamethasone 4 MG tablet ?Commonly known as: DECADRON ?Place 1 tablet (4 mg total) into feeding tube daily. ?  ?doxycycline 100 MG capsule ?Commonly known as: VIBRAMYCIN ?Take 1 capsule (100 mg total) by mouth 2 (two) times daily for 4 days. ?  ?escitalopram 5 MG tablet ?Commonly known as: LEXAPRO ?Place 1 tablet (5 mg total) into feeding tube daily. ?  ?famotidine 20 MG tablet ?Commonly known as: PEPCID ?Place 1 tablet (20 mg total) into feeding tube daily. ?  ?folic acid 1 MG tablet ?Commonly known as: FOLVITE ?Place 1 tablet (1 mg total) into feeding tube daily. ?  ?furosemide 40 MG tablet ?Commonly known as: LASIX ?Place 1 tablet (40 mg total) into feeding tube 2 (two) times daily. ?  ?gabapentin 300 MG capsule ?Commonly known as: NEURONTIN ?Place 2 capsules (600 mg total) into feeding tube at bedtime. ?  ?Gerhardt's butt cream Crea ?Apply 1 application. topically 3 (three) times daily as needed for irritation. ?  ?ipratropium-albuterol 0.5-2.5 (3) MG/3ML Soln ?Commonly known as: DUONEB ?Take 3 mLs by nebulization every 6 (six) hours as needed. ?  ?levETIRAcetam 100 MG/ML solution ?Commonly known as: KEPPRA ?Place 7.5 mLs (750 mg total) into feeding tube 2 (two) times daily. ?  ?levothyroxine 150 MCG tablet ?Commonly known as: SYNTHROID ?Take 150 mcg by mouth daily. ?  ?lidocaine 5 % ?Commonly known as: LIDODERM ?Place 1 patch onto the skin daily. Remove & Discard patch within 12 hours or as directed by MD ?  ?magic mouthwash w/lidocaine  Soln ?Take 10 mLs by mouth 3 (three) times daily as needed for mouth pain. SWISH AND SPIT. DO NOT SWALLOW. ?  ?midodrine 5 MG tablet ?Commonly known as: PROAMATINE ?Place 1 tablet (5 mg total) into feeding tube 2 (two) times daily with a meal. ?What changed: when to take this ?  ?oxyCODONE 5 MG immediate release tablet ?Commonly known as: Oxy IR/ROXICODONE ?Take 1 tablet (5 mg total) by mouth every 6 (six) hours as needed for up to 3 days for moderate pain. ?  ?polyethylene glycol powder 17 GM/SCOOP powder ?Commonly known as: GLYCOLAX/MIRALAX ?Dissolve 1 capful (17 g) in liquid and add into feeding tube daily as needed for moderate constipation. ?  ?potassium chloride 20 MEQ/15ML (10%) Soln ?Take 15 mLs by mouth daily. ?  ?vitamin B-12 1000 MCG tablet ?Commonly known as: CYANOCOBALAMIN ?Place 0.5 tablets (500 mcg total) into feeding tube daily. ?  ? ?  ? ?  ?  ? ? ?  ?Durable Medical Equipment  ?(From admission, onward)  ?  ? ? ?  ? ?  Start     Ordered  ? 08/13/21  0942  For home use only DME Shower stool  Once       ?CommentsDonneta Romberg CHAIR  ? 08/13/21 0941  ? ?  ?  ? ?  ? ? Follow-up Information   ? ? [facility] Schedule an appointment as soon as possible for a visit in 1 week(s).   ?Why: Hospital Follow Up ?Contact information: ?[address] ?[place] [address] ?[phone] ? ? ?  ?  ? ?  ?  ? ?  ? ?Allergies  ?Allergen Reactions  ? Codeine Hives and Itching  ?  Reports itching only per RN  ? Penicillins Hives  ?  Did it involve swelling of the face/tongue/throat, SOB, or low BP? No ?Did it involve sudden or severe rash/hives, skin peeling, or any reaction on the inside of your mouth or nose? Yes ?Did you need to seek medical attention at a hospital or doctor's office? Unknown ?When did it last happen?      Over 10 years ?If all above answers are ?NO?, may proceed with cephalosporin use. ?  ? ?Allergies as of 08/13/2021   ? ?   Reactions  ? Codeine Hives, Itching  ? Reports  itching only per RN  ? Penicillins Hives  ? Did it involve swelling of the face/tongue/throat, SOB, or low BP? No ?Did it involve sudden or severe rash/hives, skin peeling, or any reaction on the inside of your

## 2021-08-17 ENCOUNTER — Telehealth: Payer: Self-pay | Admitting: *Deleted

## 2021-08-17 ENCOUNTER — Telehealth: Payer: Self-pay | Admitting: Hematology and Oncology

## 2021-08-17 NOTE — Telephone Encounter (Signed)
RETURNED PATIENT'S SISTER'S PHONE [place], SPOKE [place] ?

## 2021-08-17 NOTE — Telephone Encounter (Signed)
Per 4/19 in basket called pt and spoke to sister.  Pt sister said to cancel appointments and she will call back in the future to reschedule  ?

## 2021-08-19 ENCOUNTER — Ambulatory Visit (HOSPITAL_COMMUNITY): Payer: Medicaid Other

## 2021-08-22 ENCOUNTER — Telehealth: Payer: Self-pay

## 2021-08-22 NOTE — Telephone Encounter (Signed)
Received call from pt's sister, [name] who states she had to cancel all appts because the pt is not able to go have CT or come to the Dr, d/t not being about to bend legs because of infection. She states she will call back to r/s when she is able to.  ?

## 2021-08-23 ENCOUNTER — Encounter: Payer: Medicaid Other | Admitting: Nurse Practitioner

## 2021-08-23 ENCOUNTER — Ambulatory Visit: Payer: Self-pay | Admitting: Radiation Oncology

## 2021-08-23 ENCOUNTER — Ambulatory Visit: Payer: Medicaid Other | Admitting: Hematology and Oncology

## 2021-08-30 ENCOUNTER — Ambulatory Visit: Payer: Medicaid Other | Admitting: Hematology and Oncology

## 2021-11-08 ENCOUNTER — Telehealth: Payer: Self-pay | Admitting: *Deleted

## 2021-11-08 NOTE — Telephone Encounter (Signed)
Called patient to inform of CT for 11-29-21 - arrival time- 4:45 pm @ [facility] Radiology, patient to have water only- 4 hrs. prior to test, patient to receive results from Dr. Isidore Moos on 11-30-21 @ 11 am, lvm for a return call

## 2021-11-29 ENCOUNTER — Encounter (HOSPITAL_COMMUNITY): Payer: Self-pay

## 2021-11-29 ENCOUNTER — Ambulatory Visit (HOSPITAL_COMMUNITY): Admission: RE | Admit: 2021-11-29 | Payer: Medicaid Other | Source: Ambulatory Visit

## 2021-11-30 ENCOUNTER — Encounter: Payer: Self-pay | Admitting: Radiation Oncology

## 2021-11-30 ENCOUNTER — Ambulatory Visit: Payer: Medicaid Other | Admitting: Radiation Oncology

## 2021-11-30 ENCOUNTER — Telehealth: Payer: Self-pay | Admitting: *Deleted

## 2021-11-30 NOTE — Telephone Encounter (Signed)
CALLED PATIENT'S SISTER- [name] JOHNSON TO INQUIRE WHY HER SISTER MISSED HER SCAN, SPOKE WITH MS. JOHNSON AND SHE STATED THAT THEIR AUNT HAS DIED AND ANOTHER FAMILY MEMBER HAS CANCER AND HE HAS TO HAVE A SCAN AND SURGERY AND SOMEONE ELSE HAS BEEN IN [place] @ [facility], PATIENT'S SISTER ([name] JOHNSON STATED THAT THEY WOULD THINK ABOUT RESCHEDULING AND GET BACK WITH ME, NOTIFIED KATELIN (DR. SQUIRE'S NURSE) AND DR. SQUIRE

## 2021-11-30 NOTE — Progress Notes (Signed)
Veronica Horton is a very nice woman with many medical issues who had locally advanced laryngeal cancer.  She was treated a year and a half ago with chemoradiation.  She had to stop the chemotherapy early due to hospitalization and complications.  She has many comorbidities and social challenges.  She has no-showed for multiple appointments including today. Her scans were not done as scheduled, either.    She was sent to [facility] for possible laryngectomy as there was a concern for possible recurrent disease.  This was several months ago.  I do not think she ever got a biopsy to work that up further.  Per our scheduler's phone call today, my understanding is that the patient and her family are not electing to come in for appointments or scans in the foreseeable future.  They will call us to reschedule, possibly, with no concrete plans.   I sent a message to palliative care to ask them if they can schedule a telemedicine appointment and possibly establish her with some type of outpatient palliative care such as an agency for home visits.  If she ever decides she does want to come in to see me and the rest of the oncology team we can certainly do that, but palliative care at minimum may give her some support and improve her QOL.  -----------------------------------  Eppie Gibson, MD

## 2021-12-01 ENCOUNTER — Telehealth: Payer: Self-pay | Admitting: Hematology and Oncology

## 2021-12-01 ENCOUNTER — Telehealth: Payer: Self-pay | Admitting: Oncology

## 2021-12-01 NOTE — Telephone Encounter (Signed)
Called patient to try and schedule a palliative appointment per 8/3 staff message. Talked with the patients sister [name] and [name] stated that they are having family issues right now and the patient is unable to make appointments for at least 6-8 weeks. This is due to transportation and that individual having surgery. Scheduler informed [place] RN who sent the staff message for the patient to be scheduled.

## 2021-12-01 NOTE — Telephone Encounter (Signed)
Called the patients sister [name] to offer them a phone visit instead of an in-person visit. [name] stated that she is okay with this and we set up an upcoming appointment. Monday 12/19/21 at 1p. Patients best phone number was added to the appointment notes.

## 2021-12-05 ENCOUNTER — Other Ambulatory Visit (HOSPITAL_COMMUNITY): Payer: Self-pay | Admitting: Nephrology

## 2021-12-05 DIAGNOSIS — I129 Hypertensive chronic kidney disease with stage 1 through stage 4 chronic kidney disease, or unspecified chronic kidney disease: Secondary | ICD-10-CM

## 2021-12-12 ENCOUNTER — Other Ambulatory Visit (HOSPITAL_COMMUNITY): Payer: Medicaid Other

## 2021-12-13 ENCOUNTER — Encounter (HOSPITAL_COMMUNITY): Payer: Self-pay

## 2021-12-13 ENCOUNTER — Ambulatory Visit (HOSPITAL_COMMUNITY): Admission: RE | Admit: 2021-12-13 | Payer: Medicaid Other | Source: Ambulatory Visit

## 2021-12-22 ENCOUNTER — Inpatient Hospital Stay: Payer: Medicaid Other | Attending: Nurse Practitioner | Admitting: Nurse Practitioner

## 2021-12-22 DIAGNOSIS — Z66 Do not resuscitate: Secondary | ICD-10-CM | POA: Insufficient documentation

## 2021-12-22 DIAGNOSIS — C329 Malignant neoplasm of larynx, unspecified: Secondary | ICD-10-CM | POA: Insufficient documentation

## 2021-12-22 DIAGNOSIS — Z515 Encounter for palliative care: Secondary | ICD-10-CM | POA: Diagnosis not present

## 2021-12-22 DIAGNOSIS — G893 Neoplasm related pain (acute) (chronic): Secondary | ICD-10-CM

## 2021-12-22 DIAGNOSIS — R531 Weakness: Secondary | ICD-10-CM | POA: Insufficient documentation

## 2021-12-22 DIAGNOSIS — K5900 Constipation, unspecified: Secondary | ICD-10-CM | POA: Diagnosis not present

## 2021-12-22 DIAGNOSIS — K5903 Drug induced constipation: Secondary | ICD-10-CM

## 2021-12-22 DIAGNOSIS — F17210 Nicotine dependence, cigarettes, uncomplicated: Secondary | ICD-10-CM | POA: Diagnosis not present

## 2021-12-22 DIAGNOSIS — R5383 Other fatigue: Secondary | ICD-10-CM | POA: Insufficient documentation

## 2021-12-22 DIAGNOSIS — C321 Malignant neoplasm of supraglottis: Secondary | ICD-10-CM

## 2021-12-22 DIAGNOSIS — Z79899 Other long term (current) drug therapy: Secondary | ICD-10-CM | POA: Diagnosis not present

## 2021-12-22 DIAGNOSIS — R530 Neoplastic (malignant) related fatigue: Secondary | ICD-10-CM

## 2021-12-23 ENCOUNTER — Encounter: Payer: Self-pay | Admitting: Nurse Practitioner

## 2021-12-23 NOTE — Progress Notes (Signed)
[facility]  [phone] [phone]   Name: CINDEE MCLESTER Date: 12/23/2021 MRN: [id number]  DOB: May 08, 1960  Patient Care Team: [facility] as PCP - [place]ina, Franciso Bend, DO as Consulting Physician (Otolaryngology) Benay Pike, MD as Consulting Physician (Hematology and Oncology) Eppie Gibson, MD as Consulting Physician (Radiation Oncology) Malmfelt, Stephani Police, RN as Oncology Nurse Navigator   I connected with [name] Audino on 12/23/21 at  2:45 PM EDT by phoneand verified that I am speaking with the correct person using two identifiers.   I discussed the limitations, risks, security and privacy concerns of performing an evaluation and management service by telemedicine and the availability of in-person appointments. I also discussed with the patient that there may be a patient responsible charge related to this service. The patient expressed understanding and agreed to proceed.   Other persons participating in the visit and their role in the encounter: Maygan, RN and [name] (sister)   Patient's location: Home   Provider's location: [place]: JEFFRIE STANDER is a 61 y.o. female with medical history including locally advanced laryngeal cancer s/p chemoradiation. Palliative ask to see for symptom management and goals of care.    SOCIAL HISTORY:     reports that she has been smoking cigarettes. She has been smoking an average of 1 pack per day. She has never used smokeless tobacco. She reports that she does not drink alcohol and does not use drugs.  ADVANCE DIRECTIVES:    CODE STATUS: DNR  PAST MEDICAL HISTORY: Past Medical History:  Diagnosis Date   Aspiration pneumonitis ([place]) 01/17/2019   Bronchitis    Class 3 obesity ([place]) 01/23/2020   COPD (chronic obstructive pulmonary disease) ([facility])    Degenerative disc disease, lumbar    DVT  (deep venous thrombosis) ([place]) 08/08/2021   Hiatal hernia    Hypertension    Seizure ([place]) 11/28/2020   Small bowel obstruction ([place]) 01/13/2019   Throat cancer ([place])     PAST SURGICAL HISTORY:  Past Surgical History:  Procedure Laterality Date   BIOPSY  04/12/2020   Procedure: BIOPSY;  Surgeon: Wilford Corner, MD;  Location: [facility];  Service: Gastroenterology;;   CHOLECYSTECTOMY     degenerative bone disease     DIRECT LARYNGOSCOPY N/A 03/13/2020   Procedure: DIRECT LARYNGOSCOPY WITH BIOPSY;  Surgeon: Jason Coop, DO;  Location: [facility];  Service: ENT;  Laterality: N/A;   FLEXIBLE SIGMOIDOSCOPY N/A 04/12/2020   Procedure: Beryle Quant;  Surgeon: Wilford Corner, MD;  Location: [facility];  Service: Gastroenterology;  Laterality: N/A;   INCISIONAL HERNIA REPAIR N/A 01/15/2019   Procedure: [name] HERNIORRHAPHY WITH MESH;  Surgeon: [name], MD;  Location: [facility];  Service: General;  Laterality: N/A;   IR GASTROSTOMY TUBE MOD SED  03/22/2020   IR [place] TUBE CHANGE  07/24/2020   IR RADIOLOGIST EVAL & MGMT  05/07/2020   IR REPLACE G-TUBE SIMPLE WO FLUORO  11/30/2020   OMENTECTOMY N/A 01/15/2019   Procedure: OMENTECTOMY;  Surgeon: [name], MD;  Location: [facility];  Service: General;  Laterality: N/A;   POLYPECTOMY  04/12/2020   Procedure: POLYPECTOMY;  Surgeon: Wilford Corner, MD;  Location: [facility];  Service: Gastroenterology;;   TRACHEOSTOMY TUBE PLACEMENT N/A 03/13/2020   Procedure: AWAKE TRACHEOSTOMY;  Surgeon: Jason Coop, DO;  Location: [facility];  Service: ENT;  Laterality: N/A;    HEMATOLOGY/ONCOLOGY HISTORY:  Oncology History  Laryngeal cancer ([place])  03/18/2020 Initial Diagnosis   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[place])   03/22/2020 Cancer Staging   Staging form: Larynx - Supraglottis, AJCC 7th Edition - Clinical: Stage IVA (T4a, N2c, M0) - Signed by Eppie Gibson, MD on 03/22/2020   03/23/2020 - 03/23/2020  Chemotherapy   The patient had dexamethasone (DECADRON) 4 MG tablet, 8 mg, Oral, Daily, 0 of 1 cycle, Start date: --, End date: -- palonosetron (ALOXI) injection 0.25 mg, 0.25 mg, Intravenous,  Once, 0 of 10 cycles CISplatin (PLATINOL) 182 mg in sodium chloride 0.9 % 500 mL chemo infusion, 80 mg/m2, Intravenous,  Once, 0 of 10 cycles gemcitabine (GEMZAR) 2,280 mg in sodium chloride 0.9 % 250 mL chemo infusion, 1,000 mg/m2, Intravenous,  Once, 0 of 3 cycles fosaprepitant (EMEND) 150 mg in sodium chloride 0.9 % 145 mL IVPB, 150 mg, Intravenous,  Once, 0 of 10 cycles  for chemotherapy treatment.    03/29/2020 - 05/12/2020 Chemotherapy           ALLERGIES:  is allergic to codeine and penicillins.  MEDICATIONS:  Current Outpatient Medications  Medication Sig Dispense Refill   gabapentin (NEURONTIN) 300 MG capsule 1 capsule Orally Three times a day for 30     apixaban (ELIQUIS) 5 MG TABS tablet Take 1 tablet (5 mg total) by mouth 2 (two) times daily. Restart taking only after next Monday(8/22) 60 tablet 1   arformoterol (BROVANA) 15 MCG/2ML NEBU Take 2 mLs (15 mcg total) by nebulization 2 (two) times daily. 120 mL 2   budesonide (PULMICORT) 0.5 MG/2ML nebulizer solution Take 2 mLs (0.5 mg total) by nebulization 2 (two) times daily. 120 mL 2   dexamethasone (DECADRON) 4 MG tablet Place 1 tablet (4 mg total) into feeding tube daily.     escitalopram (LEXAPRO) 5 MG tablet Place 1 tablet (5 mg total) into feeding tube daily. 30 tablet 2   famotidine (PEPCID) 20 MG tablet Place 1 tablet (20 mg total) into feeding tube daily. 30 tablet 0   folic acid (FOLVITE) 1 MG tablet Place 1 tablet (1 mg total) into feeding tube daily. 30 tablet 2   furosemide (LASIX) 40 MG tablet Place 1 tablet (40 mg total) into feeding tube 2 (two) times daily.     gabapentin (NEURONTIN) 300 MG capsule Place 2 capsules (600 mg total) into feeding tube at bedtime.     ipratropium-albuterol (DUONEB) 0.5-2.5 (3) MG/3ML SOLN  Take 3 mLs by nebulization every 6 (six) hours as needed. (Patient not taking: Reported on 11/29/2020) 360 mL 2   levETIRAcetam (KEPPRA) 100 MG/ML solution Place 7.5 mLs (750 mg total) into feeding tube 2 (two) times daily. 473 mL 0   levothyroxine (SYNTHROID) 150 MCG tablet Take 150 mcg by mouth daily.     lidocaine (LIDODERM) 5 % Place 1 patch onto the skin daily. Remove & Discard patch within 12 hours or as directed by MD 30 patch 0   magic mouthwash w/lidocaine SOLN Take 10 mLs by mouth 3 (three) times daily as needed for mouth pain. SWISH AND SPIT. DO NOT SWALLOW. 360 mL 0   midodrine (PROAMATINE) 5 MG tablet Place 1 tablet (5 mg total) into feeding tube 2 (two) times daily with a meal. (Patient taking differently: Place 5 mg into feeding tube daily.) 60 tablet 2   Nystatin (GERHARDT'S BUTT CREAM) CREA Apply 1 application. topically 3 (three) times daily as needed for irritation. 1 each 2   oxyCODONE (OXY IR/ROXICODONE)  5 MG immediate release tablet Take by mouth.     polyethylene glycol powder (GLYCOLAX/MIRALAX) 17 GM/SCOOP powder Dissolve 1 capful (17 g) in liquid and add into feeding tube daily as needed for moderate constipation. (Patient not taking: Reported on 08/09/2021) 510 g 0   potassium chloride 20 MEQ/15ML (10%) SOLN Take 15 mLs by mouth daily.     vitamin B-12 (CYANOCOBALAMIN) 1000 MCG tablet Place 0.5 tablets (500 mcg total) into feeding tube daily. 30 tablet 3   No current facility-administered medications for this visit.    VITAL SIGNS: There were no vitals taken for this visit. There were no vitals filed for this visit.  Estimated body mass index is 43.4 kg/m as calculated from the following:   Height as of 08/08/21: '5\' 5"'$  (1.651 m).   Weight as of 08/09/21: 260 lb 12.9 oz (118.3 kg).  LABS: CBC:    Component Value Date/Time   WBC 8.8 08/10/2021 0449   HGB 8.6 (L) 08/10/2021 0449   HGB 9.8 (L) 05/10/2020 1328   HCT 27.2 (L) 08/10/2021 0449   PLT 177 08/10/2021 0449    PLT 179 05/10/2020 1328   MCV 94.1 08/10/2021 0449   NEUTROABS 9.0 (H) 08/08/2021 1440   LYMPHSABS 0.2 (L) 08/08/2021 1440   MONOABS 0.3 08/08/2021 1440   EOSABS 0.0 08/08/2021 1440   BASOSABS 0.0 08/08/2021 1440   Comprehensive Metabolic Panel:    Component Value Date/Time   NA 141 08/10/2021 0449   K 3.5 08/10/2021 0449   CL 109 08/10/2021 0449   CO2 22 08/10/2021 0449   BUN 55 (H) 08/10/2021 0449   CREATININE 1.65 (H) 08/10/2021 0449   CREATININE 1.39 (H) 05/10/2020 1328   GLUCOSE 94 08/10/2021 0449   CALCIUM 8.2 (L) 08/10/2021 0449   AST 26 08/08/2021 1440   AST 9 (L) 05/10/2020 1328   ALT 25 08/08/2021 1440   ALT 9 05/10/2020 1328   ALKPHOS 111 08/08/2021 1440   BILITOT 0.9 08/08/2021 1440   BILITOT 0.5 05/10/2020 1328   PROT 6.8 08/08/2021 1440   ALBUMIN 2.1 (L) 08/10/2021 0449    RADIOGRAPHIC STUDIES: No results found.  PERFORMANCE STATUS (ECOG) : 1 - Symptomatic but completely ambulatory   IMPRESSION: This is my initial visit with Ms.Pae. Her sister [name] also joined [place] on the call at patient's request.  No acute distress identified.  Patient is alert and able to engage in discussions appropriately.  I introduced myself, Maygan RN, and Palliative's role in collaboration with the oncology team. Concept of Palliative Care was introduced as specialized medical care for people and their families living with serious illness.  It focuses on providing relief from the symptoms and stress of a serious illness.  The goal is to improve quality of life for both the patient and the family. Values and goals of care important to patient and family were attempted to be elicited.   Ms. Flanery lives in the home with her boyfriend Milta Deiters) and cousin Lanny Hurst).  Her aunt recently passed away who also lives in the home.  Divorced.  She has 1 son who lives in [place].  3 dogs.  She shares that her sister is involved in her care.  Unfortunately she also has another sister who is battling  stage IV cancer.  Complains of ongoing weakness and fatigue.  Able to perform most ADLs independently or with some assistance.  Ambulates with a cane.  Also uses a rollator at times.  Appetite is good.  Pain Pamala Hurry  states she has ongoing pain.  Her pain is generally in her neck, back, and sometimes lower extremities.  States she has pain on a daily basis.   She is currently taking oxycodone 5 mg every 6 hours as needed for pain which she just filled 8/22.  Also using lidocaine 5% patches to her back.  She does feel current regimen is effective.  She rates her pain 8 out of 10 and this will decrease to 4-5 out of 10 with medication.  We discussed continuing current regimen with close monitoring.  If patient continues to have persistent pain could potentially consider a more long-acting pain medication.  Constipation Endorses occasional constipation.  Education provided on the use of daily MiraLAX for bowel regimen in the setting of opioid use.  Her sister confirms they do have MiraLAX on hand at home and will begin taking.  Goals of care We discussed her current illness and what it means in the larger context of Her on-going co-morbidities. Natural disease trajectory and expectations were discussed.  Ms. Charrier and her sister are realistic in their understanding.  She does not wish to pursue further treatment or work-up at this time with understanding if her cancer has recurred could potentially progress.  She is clear in her expressed wishes to continue taking life 1 day at a time and focusing on her comfort.  Education provided on outpatient palliative support in addition to continued support through the cancer center.  She and family verbalized understanding and is open to referral.  I discussed the importance of continued conversation with family and their medical providers regarding overall plan of care and treatment options, ensuring decisions are within the context of the patients values  and GOCs.  PLAN: Establish therapeutic relationship.  Education provided on palliative's role in collaboration with oncology/radiation medical team. Continue oxycodone as needed for pain Lidocaine 5% patches for pain MiraLAX daily for bowel regimen Ongoing goals of care discussions and support Patient is clear and expressed wishes for no further work-up at this time with realistic understanding of all conditions.  Patient is open to outpatient palliative referral.  We will send a referral to [facility] as requested.   I will plan to see patient back in 2-4 weeks in collaboration to other oncology appointments.    Patient expressed understanding and was in agreement with this plan. She also understands that She can call the clinic at any time with any questions, concerns, or complaints.   Thank you for your referral and allowing Palliative to assist in Mr./Mrs. [place] care.   Number and complexity of problems addressed: 3 HIGH - 1 or more chronic illnesses with SEVERE exacerbation, progression, or side effects of treatment - advanced cancer, pain. Any controlled substances utilized were prescribed in the context of palliative care.  Time Total: 50 min   Visit consisted of counseling and education dealing with the complex and emotionally intense issues of symptom management and palliative care in the setting of serious and potentially life-threatening illness.Greater than 50%  of this time was spent counseling and coordinating care related to the above assessment and plan.  Signed by: Alda Lea, AGPCNP-BC Palliative Medicine Team/[place]

## 2022-01-03 ENCOUNTER — Other Ambulatory Visit: Payer: Self-pay

## 2022-01-03 ENCOUNTER — Encounter (HOSPITAL_COMMUNITY): Payer: Self-pay | Admitting: *Deleted

## 2022-01-03 ENCOUNTER — Inpatient Hospital Stay (HOSPITAL_COMMUNITY)
Admission: EM | Admit: 2022-01-03 | Discharge: 2022-01-09 | DRG: 602 | Disposition: A | Discharge status: Home / Self Care | Payer: Medicaid Other | Attending: Internal Medicine | Admitting: Internal Medicine

## 2022-01-03 ENCOUNTER — Emergency Department (HOSPITAL_COMMUNITY): Payer: Medicaid Other

## 2022-01-03 DIAGNOSIS — Z79899 Other long term (current) drug therapy: Secondary | ICD-10-CM | POA: Diagnosis not present

## 2022-01-03 DIAGNOSIS — G8929 Other chronic pain: Secondary | ICD-10-CM | POA: Diagnosis present

## 2022-01-03 DIAGNOSIS — L03119 Cellulitis of unspecified part of limb: Secondary | ICD-10-CM | POA: Diagnosis present

## 2022-01-03 DIAGNOSIS — N390 Urinary tract infection, site not specified: Secondary | ICD-10-CM | POA: Diagnosis present

## 2022-01-03 DIAGNOSIS — K219 Gastro-esophageal reflux disease without esophagitis: Secondary | ICD-10-CM | POA: Diagnosis present

## 2022-01-03 DIAGNOSIS — Z86718 Personal history of other venous thrombosis and embolism: Secondary | ICD-10-CM

## 2022-01-03 DIAGNOSIS — Z7189 Other specified counseling: Secondary | ICD-10-CM | POA: Diagnosis not present

## 2022-01-03 DIAGNOSIS — E860 Dehydration: Secondary | ICD-10-CM | POA: Diagnosis present

## 2022-01-03 DIAGNOSIS — Z6841 Body Mass Index (BMI) 40.0 and over, adult: Secondary | ICD-10-CM | POA: Diagnosis not present

## 2022-01-03 DIAGNOSIS — F17210 Nicotine dependence, cigarettes, uncomplicated: Secondary | ICD-10-CM | POA: Diagnosis present

## 2022-01-03 DIAGNOSIS — I10 Essential (primary) hypertension: Secondary | ICD-10-CM | POA: Diagnosis not present

## 2022-01-03 DIAGNOSIS — Z66 Do not resuscitate: Secondary | ICD-10-CM | POA: Diagnosis present

## 2022-01-03 DIAGNOSIS — Z930 Tracheostomy status: Secondary | ICD-10-CM | POA: Diagnosis not present

## 2022-01-03 DIAGNOSIS — C329 Malignant neoplasm of larynx, unspecified: Secondary | ICD-10-CM

## 2022-01-03 DIAGNOSIS — B9620 Unspecified Escherichia coli [E. coli] as the cause of diseases classified elsewhere: Secondary | ICD-10-CM | POA: Diagnosis present

## 2022-01-03 DIAGNOSIS — I824Y9 Acute embolism and thrombosis of unspecified deep veins of unspecified proximal lower extremity: Secondary | ICD-10-CM | POA: Diagnosis not present

## 2022-01-03 DIAGNOSIS — J9611 Chronic respiratory failure with hypoxia: Secondary | ICD-10-CM | POA: Diagnosis present

## 2022-01-03 DIAGNOSIS — J690 Pneumonitis due to inhalation of food and vomit: Secondary | ICD-10-CM | POA: Diagnosis present

## 2022-01-03 DIAGNOSIS — E875 Hyperkalemia: Secondary | ICD-10-CM | POA: Diagnosis present

## 2022-01-03 DIAGNOSIS — I472 Ventricular tachycardia, unspecified: Secondary | ICD-10-CM | POA: Diagnosis not present

## 2022-01-03 DIAGNOSIS — Z9981 Dependence on supplemental oxygen: Secondary | ICD-10-CM | POA: Diagnosis not present

## 2022-01-03 DIAGNOSIS — J449 Chronic obstructive pulmonary disease, unspecified: Secondary | ICD-10-CM | POA: Diagnosis present

## 2022-01-03 DIAGNOSIS — N1832 Chronic kidney disease, stage 3b: Secondary | ICD-10-CM | POA: Diagnosis present

## 2022-01-03 DIAGNOSIS — I5031 Acute diastolic (congestive) heart failure: Secondary | ICD-10-CM | POA: Diagnosis not present

## 2022-01-03 DIAGNOSIS — Z8521 Personal history of malignant neoplasm of larynx: Secondary | ICD-10-CM

## 2022-01-03 DIAGNOSIS — Z85819 Personal history of malignant neoplasm of unspecified site of lip, oral cavity, and pharynx: Secondary | ICD-10-CM

## 2022-01-03 DIAGNOSIS — Z515 Encounter for palliative care: Secondary | ICD-10-CM | POA: Diagnosis not present

## 2022-01-03 DIAGNOSIS — L304 Erythema intertrigo: Secondary | ICD-10-CM | POA: Diagnosis present

## 2022-01-03 DIAGNOSIS — Z880 Allergy status to penicillin: Secondary | ICD-10-CM

## 2022-01-03 DIAGNOSIS — G40909 Epilepsy, unspecified, not intractable, without status epilepticus: Secondary | ICD-10-CM | POA: Diagnosis present

## 2022-01-03 DIAGNOSIS — R000 Tachycardia, unspecified: Secondary | ICD-10-CM

## 2022-01-03 DIAGNOSIS — Z9049 Acquired absence of other specified parts of digestive tract: Secondary | ICD-10-CM

## 2022-01-03 DIAGNOSIS — N179 Acute kidney failure, unspecified: Secondary | ICD-10-CM | POA: Diagnosis present

## 2022-01-03 DIAGNOSIS — I130 Hypertensive heart and chronic kidney disease with heart failure and stage 1 through stage 4 chronic kidney disease, or unspecified chronic kidney disease: Secondary | ICD-10-CM | POA: Diagnosis present

## 2022-01-03 DIAGNOSIS — L03115 Cellulitis of right lower limb: Secondary | ICD-10-CM | POA: Diagnosis present

## 2022-01-03 DIAGNOSIS — Z79890 Hormone replacement therapy (postmenopausal): Secondary | ICD-10-CM

## 2022-01-03 DIAGNOSIS — Z7951 Long term (current) use of inhaled steroids: Secondary | ICD-10-CM

## 2022-01-03 DIAGNOSIS — L03116 Cellulitis of left lower limb: Principal | ICD-10-CM | POA: Diagnosis present

## 2022-01-03 DIAGNOSIS — Z789 Other specified health status: Secondary | ICD-10-CM

## 2022-01-03 DIAGNOSIS — E876 Hypokalemia: Secondary | ICD-10-CM | POA: Diagnosis present

## 2022-01-03 DIAGNOSIS — I872 Venous insufficiency (chronic) (peripheral): Secondary | ICD-10-CM | POA: Diagnosis present

## 2022-01-03 DIAGNOSIS — Z8589 Personal history of malignant neoplasm of other organs and systems: Secondary | ICD-10-CM

## 2022-01-03 DIAGNOSIS — K5900 Constipation, unspecified: Secondary | ICD-10-CM | POA: Diagnosis present

## 2022-01-03 DIAGNOSIS — Z8249 Family history of ischemic heart disease and other diseases of the circulatory system: Secondary | ICD-10-CM

## 2022-01-03 DIAGNOSIS — Z86011 Personal history of benign neoplasm of the brain: Secondary | ICD-10-CM

## 2022-01-03 DIAGNOSIS — Z7901 Long term (current) use of anticoagulants: Secondary | ICD-10-CM

## 2022-01-03 DIAGNOSIS — Z720 Tobacco use: Secondary | ICD-10-CM | POA: Diagnosis present

## 2022-01-03 DIAGNOSIS — J90 Pleural effusion, not elsewhere classified: Secondary | ICD-10-CM

## 2022-01-03 DIAGNOSIS — I82409 Acute embolism and thrombosis of unspecified deep veins of unspecified lower extremity: Secondary | ICD-10-CM | POA: Diagnosis present

## 2022-01-03 DIAGNOSIS — Z885 Allergy status to narcotic agent status: Secondary | ICD-10-CM

## 2022-01-03 LAB — CBC WITH DIFFERENTIAL/PLATELET
Abs Immature Granulocytes: 0.16 10*3/uL — ABNORMAL HIGH (ref 0.00–0.07)
Basophils Absolute: 0.1 10*3/uL (ref 0.0–0.1)
Basophils Relative: 0 %
Eosinophils Absolute: 0.1 10*3/uL (ref 0.0–0.5)
Eosinophils Relative: 1 %
HCT: 34.5 % — ABNORMAL LOW (ref 36.0–46.0)
Hemoglobin: 10.7 g/dL — ABNORMAL LOW (ref 12.0–15.0)
Immature Granulocytes: 1 %
Lymphocytes Relative: 2 %
Lymphs Abs: 0.3 10*3/uL — ABNORMAL LOW (ref 0.7–4.0)
MCH: 29 pg (ref 26.0–34.0)
MCHC: 31 g/dL (ref 30.0–36.0)
MCV: 93.5 fL (ref 80.0–100.0)
Monocytes Absolute: 0.3 10*3/uL (ref 0.1–1.0)
Monocytes Relative: 2 %
Neutro Abs: 14.3 10*3/uL — ABNORMAL HIGH (ref 1.7–7.7)
Neutrophils Relative %: 94 %
Platelets: 211 10*3/uL (ref 150–400)
RBC: 3.69 MIL/uL — ABNORMAL LOW (ref 3.87–5.11)
RDW: 22.3 % — ABNORMAL HIGH (ref 11.5–15.5)
WBC: 15.2 10*3/uL — ABNORMAL HIGH (ref 4.0–10.5)
nRBC: 0 % (ref 0.0–0.2)

## 2022-01-03 LAB — COMPREHENSIVE METABOLIC PANEL
ALT: 14 U/L (ref 0–44)
AST: 20 U/L (ref 15–41)
Albumin: 3.2 g/dL — ABNORMAL LOW (ref 3.5–5.0)
Alkaline Phosphatase: 130 U/L — ABNORMAL HIGH (ref 38–126)
Anion gap: 9 (ref 5–15)
BUN: 39 mg/dL — ABNORMAL HIGH (ref 8–23)
CO2: 22 mmol/L (ref 22–32)
Calcium: 8.6 mg/dL — ABNORMAL LOW (ref 8.9–10.3)
Chloride: 105 mmol/L (ref 98–111)
Creatinine, Ser: 2.58 mg/dL — ABNORMAL HIGH (ref 0.44–1.00)
GFR, Estimated: 20 mL/min — ABNORMAL LOW (ref >60)
Glucose, Bld: 93 mg/dL (ref 70–99)
Potassium: 5.9 mmol/L — ABNORMAL HIGH (ref 3.5–5.1)
Sodium: 136 mmol/L (ref 135–145)
Total Bilirubin: 1.3 mg/dL — ABNORMAL HIGH (ref 0.3–1.2)
Total Protein: 6.8 g/dL (ref 6.5–8.1)

## 2022-01-03 LAB — BLOOD GAS, ARTERIAL
Acid-base deficit: 1.8 mmol/L (ref 0.0–2.0)
Bicarbonate: 23.1 mmol/L (ref 20.0–28.0)
Drawn by: 41977
FIO2: 36 %
O2 Saturation: 95.7 %
Patient temperature: 37
pCO2 arterial: 39 mmHg (ref 32–48)
pH, Arterial: 7.38 (ref 7.35–7.45)
pO2, Arterial: 73 mmHg — ABNORMAL LOW (ref 83–108)

## 2022-01-03 LAB — URINALYSIS, ROUTINE W REFLEX MICROSCOPIC
Bilirubin Urine: NEGATIVE
Glucose, UA: NEGATIVE mg/dL
Ketones, ur: NEGATIVE mg/dL
Nitrite: POSITIVE — AB
Protein, ur: 30 mg/dL — AB
Specific Gravity, Urine: 1.012 (ref 1.005–1.030)
WBC, UA: >50 WBC/hpf — ABNORMAL HIGH (ref 0–5)
pH: 5 (ref 5.0–8.0)

## 2022-01-03 LAB — CBG MONITORING, ED: Glucose-Capillary: 103 mg/dL — ABNORMAL HIGH (ref 70–99)

## 2022-01-03 LAB — LACTIC ACID, PLASMA: Lactic Acid, Venous: 1.8 mmol/L (ref 0.5–1.9)

## 2022-01-03 LAB — MAGNESIUM: Magnesium: 2.6 mg/dL — ABNORMAL HIGH (ref 1.7–2.4)

## 2022-01-03 MED ORDER — MEDIHONEY WOUND/BURN DRESSING EX PSTE
1.0000 | PASTE | Freq: Every day | CUTANEOUS | Status: DC
  Administered 2022-01-04 – 2022-01-08 (×5): 1 via TOPICAL
  Filled 2022-01-03: qty 44

## 2022-01-03 MED ORDER — ONDANSETRON HCL 4 MG/2ML IJ SOLN: 4.0000 mg | Freq: Four times a day (QID) | INTRAMUSCULAR | Status: DC | PRN

## 2022-01-03 MED ORDER — VANCOMYCIN HCL 2000 MG/400ML IV SOLN
2000.0000 mg | Freq: Once | INTRAVENOUS | Status: AC
  Administered 2022-01-03: 2000 mg via INTRAVENOUS
  Filled 2022-01-03: qty 400

## 2022-01-03 MED ORDER — SODIUM CHLORIDE 0.9 % IV SOLN: INTRAVENOUS | Status: AC

## 2022-01-03 MED ORDER — IPRATROPIUM-ALBUTEROL 0.5-2.5 (3) MG/3ML IN SOLN: 3.0000 mL | Freq: Four times a day (QID) | RESPIRATORY_TRACT | Status: DC | PRN

## 2022-01-03 MED ORDER — ACETAMINOPHEN 325 MG PO TABS
650.0000 mg | ORAL_TABLET | Freq: Four times a day (QID) | ORAL | Status: DC | PRN
  Administered 2022-01-04 – 2022-01-06 (×2): 650 mg via ORAL
  Filled 2022-01-03 (×2): qty 2

## 2022-01-03 MED ORDER — ONDANSETRON HCL 4 MG PO TABS: 4.0000 mg | ORAL_TABLET | Freq: Four times a day (QID) | ORAL | Status: DC | PRN

## 2022-01-03 MED ORDER — POLYETHYLENE GLYCOL 3350 17 GM/SCOOP PO POWD: 17.0000 g | Freq: Every day | ORAL | Status: DC | PRN

## 2022-01-03 MED ORDER — CALCIUM GLUCONATE-NACL 1-0.675 GM/50ML-% IV SOLN
1.0000 g | Freq: Once | INTRAVENOUS | Status: AC
Start: 2022-01-03 — End: 2022-01-03
  Administered 2022-01-03: 1000 mg via INTRAVENOUS
  Filled 2022-01-03: qty 50

## 2022-01-03 MED ORDER — NYSTATIN 100000 UNIT/GM EX POWD
Freq: Two times a day (BID) | CUTANEOUS | Status: DC
  Filled 2022-01-03: qty 15

## 2022-01-03 MED ORDER — FAMOTIDINE 20 MG PO TABS
20.0000 mg | ORAL_TABLET | Freq: Every day | ORAL | Status: DC
  Administered 2022-01-04 – 2022-01-09 (×6): 20 mg
  Filled 2022-01-03 (×6): qty 1

## 2022-01-03 MED ORDER — GABAPENTIN 300 MG PO CAPS
300.0000 mg | ORAL_CAPSULE | Freq: Every day | ORAL | Status: DC
  Administered 2022-01-03 – 2022-01-04 (×2): 300 mg
  Filled 2022-01-03 (×2): qty 1

## 2022-01-03 MED ORDER — LEVETIRACETAM 100 MG/ML PO SOLN
750.0000 mg | Freq: Two times a day (BID) | ORAL | Status: DC
  Administered 2022-01-03 – 2022-01-09 (×12): 750 mg
  Filled 2022-01-03 (×16): qty 7.5

## 2022-01-03 MED ORDER — ACETAMINOPHEN 650 MG RE SUPP: 650.0000 mg | Freq: Four times a day (QID) | RECTAL | Status: DC | PRN

## 2022-01-03 MED ORDER — MIDODRINE HCL 5 MG PO TABS
5.0000 mg | ORAL_TABLET | Freq: Two times a day (BID) | ORAL | Status: DC
  Administered 2022-01-04: 5 mg
  Filled 2022-01-03: qty 1

## 2022-01-03 MED ORDER — FOLIC ACID 1 MG PO TABS
1.0000 mg | ORAL_TABLET | Freq: Every day | ORAL | Status: DC
  Administered 2022-01-04 – 2022-01-09 (×6): 1 mg
  Filled 2022-01-03 (×6): qty 1

## 2022-01-03 MED ORDER — SODIUM BICARBONATE 8.4 % IV SOLN
INTRAVENOUS | Status: AC
  Filled 2022-01-03: qty 50

## 2022-01-03 MED ORDER — VITAMIN B-12 100 MCG PO TABS
500.0000 ug | ORAL_TABLET | Freq: Every day | ORAL | Status: DC
  Administered 2022-01-04 – 2022-01-09 (×6): 500 ug
  Filled 2022-01-03 (×6): qty 5

## 2022-01-03 MED ORDER — SODIUM ZIRCONIUM CYCLOSILICATE 10 G PO PACK
10.0000 g | PACK | Freq: Every day | ORAL | Status: AC
  Administered 2022-01-03 – 2022-01-04 (×2): 10 g via ORAL
  Filled 2022-01-03: qty 2
  Filled 2022-01-03: qty 1

## 2022-01-03 MED ORDER — SODIUM BICARBONATE 8.4 % IV SOLN
50.0000 meq | Freq: Once | INTRAVENOUS | Status: AC
  Administered 2022-01-03: 50 meq via INTRAVENOUS

## 2022-01-03 MED ORDER — BUDESONIDE 0.5 MG/2ML IN SUSP
0.5000 mg | Freq: Two times a day (BID) | RESPIRATORY_TRACT | Status: DC
  Administered 2022-01-03 – 2022-01-09 (×12): 0.5 mg via RESPIRATORY_TRACT
  Filled 2022-01-03 (×12): qty 2

## 2022-01-03 MED ORDER — VANCOMYCIN HCL 1250 MG/250ML IV SOLN
1250.0000 mg | INTRAVENOUS | Status: DC
  Administered 2022-01-05: 1250 mg via INTRAVENOUS
  Filled 2022-01-03: qty 250

## 2022-01-03 MED ORDER — APIXABAN 5 MG PO TABS
5.0000 mg | ORAL_TABLET | Freq: Two times a day (BID) | ORAL | Status: DC
  Administered 2022-01-03 – 2022-01-09 (×12): 5 mg via ORAL
  Filled 2022-01-03 (×12): qty 1

## 2022-01-03 MED ORDER — CITALOPRAM HYDROBROMIDE 20 MG PO TABS
20.0000 mg | ORAL_TABLET | Freq: Every day | ORAL | Status: DC
  Administered 2022-01-04 – 2022-01-09 (×6): 20 mg via ORAL
  Filled 2022-01-03 (×6): qty 1

## 2022-01-03 MED ORDER — CEFEPIME HCL 2 G IV SOLR
2.0000 g | Freq: Once | INTRAVENOUS | Status: AC
  Administered 2022-01-03: 2 g via INTRAVENOUS
  Filled 2022-01-03: qty 12.5

## 2022-01-03 MED ORDER — LEVOTHYROXINE SODIUM 75 MCG PO TABS
150.0000 ug | ORAL_TABLET | Freq: Every day | ORAL | Status: DC
  Administered 2022-01-04 – 2022-01-09 (×6): 150 ug via ORAL
  Filled 2022-01-03 (×6): qty 2

## 2022-01-03 MED ORDER — MAGIC MOUTHWASH W/LIDOCAINE: 10.0000 mL | Freq: Three times a day (TID) | ORAL | Status: DC | PRN

## 2022-01-03 NOTE — Progress Notes (Signed)
Pharmacy Antibiotic Note  Veronica Horton is a 61 y.o. female admitted on 01/03/2022 with cellulitis.  Pharmacy has been consulted for Vancomycin dosing.  Plan: Vancomycin '2000mg'$  IV loading dose then 1250 mg IV Q 48 hrs. Goal AUC 400-550. Expected AUC: 499 SCr used: 2.58  F/U cultures and clinical progress Monitor V/S, labs and levels as indicated  Height: 5' 5.5" (166.4 cm) Weight: 115.7 kg (255 lb) IBW/kg (Calculated) : 58.15  Temp (24hrs), Avg:98.8 F (37.1 C), Min:98.8 F (37.1 C), Max:98.8 F (37.1 C)  Recent Labs  Lab 01/03/22 1204  WBC 15.2*  CREATININE 2.58*  LATICACIDVEN 1.8    Estimated Creatinine Clearance: 29 mL/min (A) (by C-G formula based on SCr of 2.58 mg/dL (H)).    Allergies  Allergen Reactions   Codeine Hives and Itching    Reports itching only per RN   Penicillins Hives    Did it involve swelling of the face/tongue/throat, SOB, or low BP? No Did it involve sudden or severe rash/hives, skin peeling, or any reaction on the inside of your mouth or nose? Yes Did you need to seek medical attention at a hospital or doctor's office? Unknown When did it last happen?      Over 10 years If all above answers are "NO", may proceed with cephalosporin use.     Antimicrobials this admission: Vancomycin 9/5 >>   Microbiology results: 9/5 BCx: pending  MRSA PCR:   Thank you for allowing pharmacy to be a part of this patient's care.  Isac Sarna, BS Pharm D, BCPS Clinical Pharmacist 01/03/2022 4:01 PM

## 2022-01-03 NOTE — Assessment & Plan Note (Addendum)
-  Lifestyle modification -Body mass index is 41.79 kg/m.

## 2022-01-03 NOTE — ED Notes (Signed)
Cleaned and rewrapped bilateral leg wounds with xeroform, abd pad, and kerlix

## 2022-01-03 NOTE — Assessment & Plan Note (Signed)
-   Continue treatment with Eliquis -In the ED bilateral lower extremity ultrasound demonstrating no blood clots.

## 2022-01-03 NOTE — H&P (Signed)
History and Physical    Patient: Veronica Horton WUJ:[id number] DOB: Sep 16, 1960 DOA: 01/03/2022 DOS: the patient was seen and examined on 01/03/2022 PCP: [facility]  Patient coming from: Home  Chief Complaint:  Chief Complaint  Patient presents with   Fall   HPI: Veronica Horton is a 61 y.o. 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  Patient reports recent visit to ED in [facility] where she was found with cellulitis and placed on clindamycin.  Despite taking the antibiotics symptoms continue to worsen and she feel unable to ambulate due to ongoing pain and swelling.  Patient reports no fever, no chills, no nausea, no vomiting, no chest pain, no worsening breathing or sick contacts.  Patient reported that herself and her sister are the ones changing wound cares at there is no home health agency that we will provide home health nurses to her house.  In the ED work-up demonstrated purulent cellulitis with worsening swelling, erythematous changes and pain to her lower extremities (left more than right).  Cultures were taken and antibiotics were initiated.  TRH consulted to place patient in the hospital for further evaluation and management.  Review of Systems: As mentioned in the history of present illness. All other systems reviewed and are negative. Past Medical History:  Diagnosis Date   Aspiration pneumonitis ([place]) 01/17/2019   Bronchitis    Class 3 obesity ([place]) 01/23/2020   COPD (chronic obstructive pulmonary disease) (HCC)    Degenerative disc disease, lumbar    DVT (deep venous thrombosis)  ([place]) 08/08/2021   Hiatal hernia    Hypertension    Seizure ([place]) 11/28/2020   Small bowel obstruction ([place]) 01/13/2019   Throat cancer [facility])    Past Surgical History:  Procedure Laterality Date   BIOPSY  04/12/2020   Procedure: BIOPSY;  Surgeon: Wilford Corner, MD;  Location: [facility];  Service: Gastroenterology;;   CHOLECYSTECTOMY     degenerative bone disease     DIRECT LARYNGOSCOPY N/A 03/13/2020   Procedure: DIRECT LARYNGOSCOPY WITH BIOPSY;  Surgeon: Jason Coop, DO;  Location: [place];  Service: ENT;  Laterality: N/A;   FLEXIBLE SIGMOIDOSCOPY N/A 04/12/2020   Procedure: Beryle Quant;  Surgeon: Wilford Corner, MD;  Location: [facility];  Service: Gastroenterology;  Laterality: N/A;   INCISIONAL HERNIA REPAIR N/A 01/15/2019   Procedure: [name] HERNIORRHAPHY WITH MESH;  Surgeon: [name], MD;  Location: [facility];  Service: General;  Laterality: N/A;   IR GASTROSTOMY TUBE MOD SED  03/22/2020   IR [place] TUBE CHANGE  07/24/2020   IR RADIOLOGIST EVAL & MGMT  05/07/2020   IR REPLACE G-TUBE SIMPLE WO FLUORO  11/30/2020   OMENTECTOMY N/A 01/15/2019   Procedure: OMENTECTOMY;  Surgeon: [name], MD;  Location: [facility];  Service: General;  Laterality: N/A;   POLYPECTOMY  04/12/2020   Procedure: POLYPECTOMY;  Surgeon: Wilford Corner, MD;  Location: [facility];  Service: Gastroenterology;;   TRACHEOSTOMY TUBE PLACEMENT N/A 03/13/2020   Procedure: AWAKE TRACHEOSTOMY;  Surgeon: Jason Coop, DO;  Location: [place];  Service: ENT;  Laterality: N/A;   Social History:  reports that she has been smoking cigarettes. She has been smoking an average  of 1 pack per day. She has never used smokeless tobacco. She reports that she does not drink alcohol and does not use drugs.  Allergies  Allergen Reactions   Codeine Hives and Itching    Reports itching only per RN   Penicillins Hives    Did it involve swelling of the face/tongue/throat, SOB, or low BP? No Did it  involve sudden or severe rash/hives, skin peeling, or any reaction on the inside of your mouth or nose? Yes Did you need to seek medical attention at a hospital or doctor's office? Unknown When did it last happen?      Over 10 years If all above answers are "NO", may proceed with cephalosporin use.     Family History  Problem Relation Age of Onset   Hypertension Mother    Sudden Cardiac Death Neg Hx     Prior to Admission medications   Medication Sig Start Date End Date Taking? Authorizing Provider  apixaban (ELIQUIS) 5 MG TABS tablet Take 1 tablet (5 mg total) by mouth 2 (two) times daily. Restart taking only after next Monday(8/22) 12/20/20  Yes Adhikari, Amrit, MD  budesonide (PULMICORT) 0.5 MG/2ML nebulizer solution Take 2 mLs (0.5 mg total) by nebulization 2 (two) times daily. 04/16/20  Yes Pokhrel, Laxman, MD  calcitRIOL (ROCALTROL) 0.25 MCG capsule Take by mouth. 11/16/21  Yes [provider]  D3 SUPER STRENGTH 50 MCG (2000 UT) CAPS Take 1 capsule by mouth daily. 11/16/21  Yes [provider]  dexamethasone (DECADRON) 4 MG tablet Place 1 tablet (4 mg total) into feeding tube daily. 08/13/21  Yes Johnson, Clanford L, MD  escitalopram (LEXAPRO) 5 MG tablet Place 1 tablet (5 mg total) into feeding tube daily. 12/13/20  Yes Shelly Coss, MD  famotidine (PEPCID) 20 MG tablet Place 1 tablet (20 mg total) into feeding tube daily. 12/13/20 01/03/22 Yes Shelly Coss, MD  folic acid (FOLVITE) 1 MG tablet Place 1 tablet (1 mg total) into feeding tube daily. 12/13/20  Yes Shelly Coss, MD  gabapentin (NEURONTIN) 300 MG capsule Place 2 capsules (600 mg total) into feeding tube at bedtime. Patient taking differently: Place 300 mg into feeding tube 3 (three) times daily as needed (nerve pain). 12/13/20  Yes Adhikari, Tamsen Meek, MD  ipratropium-albuterol (DUONEB) 0.5-2.5 (3) MG/3ML SOLN Take 3 mLs by nebulization every 6 (six) hours as needed. 04/16/20 01/03/22 Yes Pokhrel, Corrie Mckusick, MD   levETIRAcetam (KEPPRA) 100 MG/ML solution Place 7.5 mLs (750 mg total) into feeding tube 2 (two) times daily. 12/13/20  Yes Shelly Coss, MD  levothyroxine (SYNTHROID) 150 MCG tablet Take 150 mcg by mouth daily. 08/08/21  Yes [provider]  lidocaine (LIDODERM) 5 % Place 1 patch onto the skin daily. Remove & Discard patch within 12 hours or as directed by MD 08/09/20  Yes Benay Pike, MD  losartan (COZAAR) 25 MG tablet Take by mouth. 11/16/21  Yes [provider]  magic mouthwash w/lidocaine SOLN Take 10 mLs by mouth 3 (three) times daily as needed for mouth pain. SWISH AND SPIT. DO NOT SWALLOW. 08/13/21  Yes Johnson, Clanford L, MD  midodrine (PROAMATINE) 5 MG tablet Place 1 tablet (5 mg total) into feeding tube 2 (two) times daily with a meal. Patient taking differently: Place 5 mg into feeding tube daily. 12/13/20  Yes Shelly Coss, MD  Nystatin (GERHARDT'S BUTT CREAM) CREA Apply 1 application. topically 3 (three) times daily as needed for irritation. 08/13/21  Yes Johnson, Clanford L, MD  polyethylene glycol powder (GLYCOLAX/MIRALAX)  17 GM/SCOOP powder Dissolve 1 capful (17 g) in liquid and add into feeding tube daily as needed for moderate constipation. 12/13/20  Yes Adhikari, Tamsen Meek, MD  potassium chloride 20 MEQ/15ML (10%) SOLN Take 15 mLs by mouth daily. 07/28/21  Yes [provider]  VENTOLIN HFA 108 (90 Base) MCG/ACT inhaler Inhale 1 puff into the lungs every 4 (four) hours as needed for wheezing or shortness of breath. 12/01/21  Yes [provider]  vitamin B-12 (CYANOCOBALAMIN) 1000 MCG tablet Place 0.5 tablets (500 mcg total) into feeding tube daily. 12/13/20  Yes Shelly Coss, MD  furosemide (LASIX) 40 MG tablet Place 1 tablet (40 mg total) into feeding tube 2 (two) times daily. Patient not taking: Reported on 01/03/2022 08/13/21   Murlean Iba, MD    Physical Exam: Vitals:   01/03/22 1200 01/03/22 1447 01/03/22 1500 01/03/22 1700  BP:  120/60  (!) 117/58 129/61  Pulse: 76  81 86  Resp: '14  13 16  '$ Temp:  98.8 F (37.1 C)  98 F (36.7 C)  TempSrc:  Rectal    SpO2: 91% 97% 93% 91%  Weight:      Height:       General exam: Alert, awake, oriented x 3; no chest pain, no nausea or vomiting.  Good oxygen saturation on chronic supplementation. Respiratory system: Trach in place with 8 L oxygen supplementation and good saturation.  Positive scattered rhonchi; no wheezing or crackles. Cardiovascular system:RRR. No rub or gallops; unable to assess JVD with body habitus. Gastrointestinal system: Abdomen is obese, nondistended, soft and nontender. No organomegaly or masses felt. Normal bowel sounds heard. Central nervous system: Alert and oriented. No focal neurological deficits. Extremities: Please refer to images in epic for visual description of ongoing lower extremity changes; there was no cyanosis or clubbing appreciated.  Positive swelling, erythematous changes especially on the left side ascending into her thighs and presence of purulent discharges. Skin: No petechiae.  Chronic venous stasis dermatitis changes appreciated on her legs bilaterally. Psychiatry: Judgement and insight appear normal.  Flat affect.  Data Reviewed: CBC: WBCs 15.2, hemoglobin 10.7, platelet count 211 K Lactic acid 1.8 Comprehensive metabolic panel: Sodium 557, potassium 5.9, chloride 105, bicarb 22, BUN 39, creatinine 2.58, normal AST/ALT; alkaline phosphatase 130 and total bilirubin 1.3 Magnesium 2.6  Assessment and Plan: * Lower extremity cellulitis - Patient not meeting criteria for sepsis currently. -Found with purulent cellulitis bilaterally (affecting left more than right). -Outpatient treatment with clindamycin without clearance of ongoing infection. -Following cellulitis protocol patient has been started on vancomycin -Will provide fluid resuscitation, leg elevation, wound care has been consulted and will follow recommendations. -Follow  clinical response. -Patient will need referral as an outpatient for further wound care. -Provide as needed analgesics.  Acute renal failure superimposed on stage 3b chronic kidney disease ([place]) - In the setting of decreased oral intake with infection and continue use of nephrotoxic agents -Avoid hypotension, the use of contrast and minimize nephrotoxic agents usage -Fluid resuscitation will be provided overnight -Continue to closely follow renal function and electrolytes.  DVT (deep venous thrombosis) ([place]) - Continue treatment with Eliquis -In the ED bilateral lower extremity ultrasound demonstrating no blood clots.  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  Tracheostomy status ([place]) - Trach in place; adequate saturation and no complaints of shortness of  breath currently. -Continue supportive care.  Class 3 obesity (HCC) - Low calorie diet and portion control discussed with patient -Body mass index is 41.79 kg/m.   Hypertension -Stable overall -Resewn home antihypertensive regimen except for ARB and diuretics at this moment -Heart healthy diet discussed with patient.  Hyperkalemia -In the setting of chronic potassium supplementation and worsening renal -Potassium supplementation has been discontinued -Lokelma x2 provided -Calcium gluconate and bicarbonate were given -Continue telemetry monitoring.    Advance Care Planning:   Code Status: DNR   Consults: Wound care  Family Communication: No family at bedside.  Severity of Illness: The appropriate patient status for this patient is INPATIENT. Inpatient status is judged to be reasonable and necessary in order to provide the required intensity of service to ensure the patient's safety. The patient's presenting symptoms, physical exam  findings, and initial radiographic and laboratory data in the context of their chronic comorbidities is felt to place them at high risk for further clinical deterioration. Furthermore, it is not anticipated that the patient will be medically stable for discharge from the hospital within 2 midnights of admission.   * I certify that at the point of admission it is my clinical judgment that the patient will require inpatient hospital care spanning beyond 2 midnights from the point of admission due to high intensity of service, high risk for further deterioration and high frequency of surveillance required.*  Author: [name], MD 01/03/2022 6:07 PM  For on call review [email or web address].

## 2022-01-03 NOTE — Assessment & Plan Note (Addendum)
CHRONIC RESPIRATORY FAILURE WITH HYPOXIA - ON 6-8 L AT [place] in place; adequate saturation -stable

## 2022-01-03 NOTE — ED Notes (Signed)
Pt c/o redness under bilateral breasts

## 2022-01-03 NOTE — Assessment & Plan Note (Addendum)
Baseline creatinine 1.6-1.8 Presented with serum creatinine 2.58 Secondary to volume depletion Patient was told to stop her furosemide by her PCP recently Continue judicious IV fluids>>saline lock Now fluid overloaded>>start IV lasix

## 2022-01-03 NOTE — ED Provider Notes (Signed)
[facility] EMERGENCY DEPARTMENT Provider Note   CSN: [id number] Arrival date & time: 01/03/22  1129     History  Chief Complaint  Patient presents with   Lytle Michaels    Veronica Horton is a 61 y.o. female.  Pt is a 61 yo female with a pmhx significant for htn, ddd, copd on home O2 (4-8L), obesity, seizure, DVT on Eliquis, and stage IV supraglottic cancer s/p trach.  It looks like pt is no longer getting any treatment for her cancer.  Pt was seen at [facility] ED on 8/28 for leg redness.  She was d/c from the ED on clinda.  She feels like her legs are worse.  Pt said she lives alone.  Her sister helps her change the bandages on her legs.  She said home health won't come out to her house.  (EMS reported the house was very dirty).  Pt said she normally can walk, but has not been able to for the last 2 days.       Home Medications Prior to Admission medications   Medication Sig Start Date End Date Taking? Authorizing Provider  apixaban (ELIQUIS) 5 MG TABS tablet Take 1 tablet (5 mg total) by mouth 2 (two) times daily. Restart taking only after next Monday(8/22) 12/20/20   Shelly Coss, MD  arformoterol (BROVANA) 15 MCG/2ML NEBU Take 2 mLs (15 mcg total) by nebulization 2 (two) times daily. 04/16/20   Pokhrel, Laxman, MD  budesonide (PULMICORT) 0.5 MG/2ML nebulizer solution Take 2 mLs (0.5 mg total) by nebulization 2 (two) times daily. 04/16/20   Pokhrel, Corrie Mckusick, MD  dexamethasone (DECADRON) 4 MG tablet Place 1 tablet (4 mg total) into feeding tube daily. 08/13/21   Johnson, Clanford L, MD  escitalopram (LEXAPRO) 5 MG tablet Place 1 tablet (5 mg total) into feeding tube daily. 12/13/20   Shelly Coss, MD  famotidine (PEPCID) 20 MG tablet Place 1 tablet (20 mg total) into feeding tube daily. 12/13/20 08/09/21  Shelly Coss, MD  folic acid (FOLVITE) 1 MG tablet Place 1 tablet (1 mg total) into feeding tube daily. 12/13/20   Shelly Coss, MD  furosemide (LASIX) 40 MG tablet Place 1 tablet (40  mg total) into feeding tube 2 (two) times daily. 08/13/21   Johnson, Clanford L, MD  gabapentin (NEURONTIN) 300 MG capsule Place 2 capsules (600 mg total) into feeding tube at bedtime. 12/13/20   Shelly Coss, MD  gabapentin (NEURONTIN) 300 MG capsule 1 capsule Orally Three times a day for 30 10/24/21   [provider]  ipratropium-albuterol (DUONEB) 0.5-2.5 (3) MG/3ML SOLN Take 3 mLs by nebulization every 6 (six) hours as needed. Patient not taking: Reported on 11/29/2020 04/16/20 05/16/20  Flora Lipps, MD  levETIRAcetam (KEPPRA) 100 MG/ML solution Place 7.5 mLs (750 mg total) into feeding tube 2 (two) times daily. 12/13/20   Shelly Coss, MD  levothyroxine (SYNTHROID) 150 MCG tablet Take 150 mcg by mouth daily. 08/08/21   [provider]  lidocaine (LIDODERM) 5 % Place 1 patch onto the skin daily. Remove & Discard patch within 12 hours or as directed by MD 08/09/20   Benay Pike, MD  magic mouthwash w/lidocaine SOLN Take 10 mLs by mouth 3 (three) times daily as needed for mouth pain. SWISH AND SPIT. DO NOT SWALLOW. 08/13/21   Johnson, Clanford L, MD  midodrine (PROAMATINE) 5 MG tablet Place 1 tablet (5 mg total) into feeding tube 2 (two) times daily with a meal. Patient taking differently: Place 5 mg into feeding  tube daily. 12/13/20   Shelly Coss, MD  Nystatin (GERHARDT'S BUTT CREAM) CREA Apply 1 application. topically 3 (three) times daily as needed for irritation. 08/13/21   Murlean Iba, MD  oxyCODONE (OXY IR/ROXICODONE) 5 MG immediate release tablet Take by mouth. 12/20/21   [provider]  polyethylene glycol powder (GLYCOLAX/MIRALAX) 17 GM/SCOOP powder Dissolve 1 capful (17 g) in liquid and add into feeding tube daily as needed for moderate constipation. Patient not taking: Reported on 08/09/2021 12/13/20   Shelly Coss, MD  potassium chloride 20 MEQ/15ML (10%) SOLN Take 15 mLs by mouth daily. 07/28/21   [provider]  vitamin B-12  (CYANOCOBALAMIN) 1000 MCG tablet Place 0.5 tablets (500 mcg total) into feeding tube daily. 12/13/20   Shelly Coss, MD      Allergies    Codeine and Penicillins    Review of Systems   Review of Systems  Skin:  Positive for color change and rash.  All other systems reviewed and are negative.   Physical Exam Updated Vital Signs BP 120/60   Pulse 76   Temp 98.8 F (37.1 C) (Rectal)   Resp 14   Ht 5' 5.5" (1.664 m)   Wt 115.7 kg   SpO2 97%   BMI 41.79 kg/m  Physical Exam Vitals and nursing note reviewed.  Constitutional:      Appearance: Normal appearance. She is obese.  HENT:     Head: Normocephalic and atraumatic.     Right Ear: External ear normal.     Left Ear: External ear normal.     Nose: Nose normal.     Mouth/Throat:     Mouth: Mucous membranes are dry.  Eyes:     Extraocular Movements: Extraocular movements intact.     Conjunctiva/sclera: Conjunctivae normal.     Pupils: Pupils are equal, round, and reactive to light.  Neck:     Comments: Trach in place Cardiovascular:     Rate and Rhythm: Normal rate and regular rhythm.     Pulses: Normal pulses.     Heart sounds: Normal heart sounds.  Pulmonary:     Effort: Pulmonary effort is normal.     Breath sounds: Normal breath sounds.  Abdominal:     General: Abdomen is flat. Bowel sounds are normal.     Palpations: Abdomen is soft.  Musculoskeletal:        General: Normal range of motion.  Skin:    Comments: Cellulitis to both legs (left worse than right).  Intertrigo noted under both breasts and under pannus.  See pictures of legs.  Neurological:     Mental Status: She is easily aroused.        ED Results / Procedures / Treatments   Labs (all labs ordered are listed, but only abnormal results are displayed) Labs Reviewed  CBC WITH DIFFERENTIAL/PLATELET - Abnormal; Notable for the following components:      Result Value   WBC 15.2 (*)    RBC 3.69 (*)    Hemoglobin 10.7 (*)    HCT 34.5 (*)     RDW 22.3 (*)    Neutro Abs 14.3 (*)    Lymphs Abs 0.3 (*)    Abs Immature Granulocytes 0.16 (*)    All other components within normal limits  COMPREHENSIVE METABOLIC PANEL - Abnormal; Notable for the following components:   Potassium 5.9 (*)    BUN 39 (*)    Creatinine, Ser 2.58 (*)    Calcium 8.6 (*)  Albumin 3.2 (*)    Alkaline Phosphatase 130 (*)    Total Bilirubin 1.3 (*)    GFR, Estimated 20 (*)    All other components within normal limits  BLOOD GAS, ARTERIAL - Abnormal; Notable for the following components:   pO2, Arterial 73 (*)    All other components within normal limits  MAGNESIUM - Abnormal; Notable for the following components:   Magnesium 2.6 (*)    All other components within normal limits  CBG MONITORING, ED - Abnormal; Notable for the following components:   Glucose-Capillary 103 (*)    All other components within normal limits  CULTURE, BLOOD (ROUTINE X 2)  CULTURE, BLOOD (ROUTINE X 2)  LACTIC ACID, PLASMA  URINALYSIS, ROUTINE W REFLEX MICROSCOPIC    EKG EKG Interpretation  Date/Time:  Tuesday January 03 2022 12:28:11 EDT Ventricular Rate:  86 PR Interval:  140 QRS Duration: 65 QT Interval:  453 QTC Calculation: 542 R Axis:   73 Text Interpretation: Sinus rhythm Low voltage, precordial leads Borderline T abnormalities, anterior leads Prolonged QT interval No significant change since last tracing Confirmed by Isla Pence [phone]) on 01/03/2022 12:38:32 PM  Radiology DG Chest Portable 1 View  Result Date: 01/03/2022 CLINICAL DATA:  Provided history: Shortness of breath. EXAM: PORTABLE CHEST 1 VIEW COMPARISON:  Prior chest radiographs 08/08/2021 and earlier. FINDINGS: A tracheostomy tube is noted. Cardiomegaly. Central pulmonary vascular congestion. Prominence of the interstitial lung markings bilaterally, compatible with pulmonary interstitial edema. Irregular and nodular opacities within the right lung base. The nodular opacities measure up to 16  mm. Irregular opacities within the left lung base. No evidence of pneumothorax. No acute bony abnormality identified. IMPRESSION: Cardiomegaly with pulmonary interstitial edema. Opacities within bilateral lung bases, which may reflect atelectasis and/or pneumonia. Superimposed more nodular opacities within the right lung base. A chest CT is recommended to better assess for possible pulmonary nodules at this site. Small left pleural effusion, which may be partially loculated. Electronically Signed   By: Kellie Simmering D.O.   On: 01/03/2022 14:50   US Venous Img Lower Bilateral  Result Date: 01/03/2022 CLINICAL DATA:  Bilateral open wounds. EXAM: BILATERAL LOWER EXTREMITY VENOUS DOPPLER ULTRASOUND TECHNIQUE: Gray-scale sonography with graded compression, as well as color Doppler and duplex ultrasound were performed to evaluate the lower extremity deep venous systems from the level of the common femoral vein and including the common femoral, femoral, profunda femoral, popliteal and calf veins including the posterior tibial, peroneal and gastrocnemius veins when visible. The superficial great saphenous vein was also interrogated. Spectral Doppler was utilized to evaluate flow at rest and with distal augmentation maneuvers in the common femoral, femoral and popliteal veins. COMPARISON:  None Available. FINDINGS: RIGHT LOWER EXTREMITY Common Femoral Vein: No evidence of thrombus. Normal compressibility, respiratory phasicity and response to augmentation. Saphenofemoral Junction: No evidence of thrombus. Normal compressibility and flow on color Doppler imaging. Profunda Femoral Vein: No evidence of thrombus. Normal compressibility and flow on color Doppler imaging. Femoral Vein: No evidence of thrombus. Normal compressibility, respiratory phasicity and response to augmentation. Popliteal Vein: No evidence of thrombus. Normal compressibility, respiratory phasicity and response to augmentation. Calf Veins: Limited  evaluation. Superficial Great Saphenous Vein: Limited evaluation. Other Findings:  Large amount of subcutaneous edema. LEFT LOWER EXTREMITY Common Femoral Vein: No evidence of thrombus. Normal compressibility, respiratory phasicity and response to augmentation. Saphenofemoral Junction: No evidence of thrombus. Normal compressibility and flow on color Doppler imaging. Profunda Femoral Vein: No evidence of thrombus. Normal compressibility and  flow on color Doppler imaging. Femoral Vein: No evidence of thrombus. Normal compressibility, respiratory phasicity and response to augmentation. Popliteal Vein: No evidence of thrombus. Normal compressibility, respiratory phasicity and response to augmentation. Calf Veins: Limited evaluation. Superficial Great Saphenous Vein: No evidence of thrombus. Normal compressibility. Other Findings:  Subcutaneous edema. IMPRESSION: No evidence of deep venous thrombosis in either lower extremity. However, the study has technical limitations due to body habitus and edema. Limited evaluation the bilateral deep calf veins. Electronically Signed   By: Markus Daft M.D.   On: 01/03/2022 13:45    Procedures Procedures    Medications Ordered in ED Medications  vancomycin (VANCOREADY) IVPB 2000 mg/400 mL (2,000 mg Intravenous New Bag/Given 01/03/22 1448)  sodium zirconium cyclosilicate (LOKELMA) packet 10 g (has no administration in time range)  sodium bicarbonate injection 50 mEq (has no administration in time range)  calcium gluconate 1 g/ 50 mL sodium chloride IVPB (has no administration in time range)  0.9 %  sodium chloride infusion (has no administration in time range)  nystatin (MYCOSTATIN/NYSTOP) topical powder (has no administration in time range)  ceFEPIme (MAXIPIME) 2 g in sodium chloride 0.9 % 100 mL IVPB (0 g Intravenous Stopped 01/03/22 1400)    ED Course/ Medical Decision Making/ A&P                           Medical Decision Making Amount and/or Complexity of Data  Reviewed Labs: ordered. Radiology: ordered.  Risk Prescription drug management. Decision regarding hospitalization.   This patient presents to the ED for concern of leg pain, this involves an extensive number of treatment options, and is a complaint that carries with it a high risk of complications and morbidity.  The differential diagnosis includes cellulitis, dvt   Co morbidities that complicate the patient evaluation  htn, ddd, copd on home O2 (4-8L), obesity, seizure, DVT on Eliquis, and stage IV supraglottic cancer s/p trach   Additional history obtained:  Additional history obtained from epic chart review External records from outside source obtained and reviewed including EMS report   Lab Tests:  I Ordered, and personally interpreted labs.  The pertinent results include:  abg with ph 7.38, pco2 39, po2 73 (on 4 L)   Imaging Studies ordered:  I ordered imaging studies including cxr and [place] I independently visualized and interpreted imaging which showed  CXR: IMPRESSION:  Cardiomegaly with pulmonary interstitial edema.    Opacities within bilateral lung bases, which may reflect atelectasis  and/or pneumonia.    Superimposed more nodular opacities within the right lung base. A  chest CT is recommended to better assess for possible pulmonary  nodules at this site.    Small left pleural effusion, which may be partially loculated.  [place]: IMPRESSION:  No evidence of deep venous thrombosis in either lower extremity.  However, the study has technical limitations due to body habitus and  edema. Limited evaluation the bilateral deep calf veins.   I agree with the radiologist interpretation   Cardiac Monitoring:  The patient was maintained on a cardiac monitor.  I personally viewed and interpreted the cardiac monitored which showed an underlying rhythm of: nsr   Medicines ordered and prescription drug management:  I ordered medication including vanc and maxipime    for cellulitis  Reevaluation of the patient after these medicines showed that the patient improved I have reviewed the patients home medicines and have made adjustments as needed   Test Considered:  [place]   Critical Interventions:  abx   Consultations Obtained:  I requested consultation with the hospitalist (Dr. Dyann Kief),  and discussed lab and imaging findings as well as pertinent plan - he will admit   Problem List / ED Course:  Cellulitis with failure of outpatient tx:  pt given vanc and maxipime Intertrigo:  pt given nystatin powder Chronic resp failure:  pt on 4L oxygen via [place].  She is stable.  PCO2 is not elevated Acute on chronic renal insufficiency:  pt normally has stage 3b ckd per nephrology note in July.  She is now in stage 4.  This is likely related to some mild dehydration for not eating/drinking much.  Hyperkalemia:  pt has no EKG changes.  Lokelma ordered.   Reevaluation:  After the interventions noted above, I reevaluated the patient and found that they have :improved   Social Determinants of Health:  Lives at home   Dispostion:  After consideration of the diagnostic results and the patients response to treatment, I feel that the patent would benefit from admission.    CRITICAL CARE Performed by: Isla Pence   Total critical care time: 30 minutes  Critical care time was exclusive of separately billable procedures and treating other patients.  Critical care was necessary to treat or prevent imminent or life-threatening deterioration.  Critical care was time spent personally by me on the following activities: development of treatment plan with patient and/or surrogate as well as nursing, discussions with consultants, evaluation of patient's response to treatment, examination of patient, obtaining history from patient or surrogate, ordering and performing treatments and interventions, ordering and review of laboratory studies, ordering and review of  radiographic studies, pulse oximetry and re-evaluation of patient's condition.         Final Clinical Impression(s) / ED Diagnoses Final diagnoses:  Cellulitis of right lower extremity  Cellulitis of left lower extremity  Intertrigo  Failure of outpatient treatment  History of cancer of head or neck  Chronic respiratory failure with hypoxia (HCC)  Acute renal failure superimposed on stage 3b chronic kidney disease, unspecified acute renal failure type [facility])    Rx / DC Orders ED Discharge Orders     None         Isla Pence, MD 01/03/22 [phone]

## 2022-01-03 NOTE — Assessment & Plan Note (Addendum)
-   Patient not meeting criteria for sepsis currently. -Found with purulent cellulitis bilaterally (affecting left more than right). -Failed outpatient clindamycin -Continue vancomycin>>slowly improving>>doxy -Wound care consult appreciated.

## 2022-01-03 NOTE — Consult Note (Signed)
[place] Nurse Consult Note: Reason for Consult:bilateral LEs with erythema, edema, full thickness wound to pretibial (left) LE and partial thickness to medial knee (left) LE Also reported was erythema intertrigo in the inframammary and subpannicular areas.  Wound type:infection, venous insufficiency, moisture  ICD-10 CM Codes for Irritant Dermatitis L30.4  - Erythema intertrigo. Also used for abrasion of the hand, chafing of the skin, dermatitis due to sweating and friction, friction dermatitis, friction eczema, and genital/thigh intertrigo.   Pressure Injury POA: N/A Measurement:To be obtained by Bedside RN with next application of dressings today Wound bed: partial thickness (pink, moist), full thickness (yellow, moist) Drainage (amount, consistency, odor) As noted above Periwound:intact, dry with poor hygiene Dressing procedure/placement/frequency: I have provided Nursing with guidance for the care of the moisture affected areas in the intertriginous areas such as the inframammary and the subpannicular folds using InterDry, our house antimicrobial wicking textile. Care to the LEs will be to wash with soap and water daily, rinse and dry, then dress left LE wounds with MediHoney (antimicrobial gel) topped with silicone foam. Mild compression to both legs will be with a dry boot (ie., Kerlix wrapped from just below toes to just below knees, then topped with 6-inch ACE bandages). Floatation of heels by placing the feet into Prevalon boots is requested. A silicone foam to the sacrum is requested for PI prevention. Turning and repositioning is in place.  I have discussed my recommendation for an ABI to be obtained in house and for referral at time of discharge to an outpatient wound care center if she is discharged to the home.  [facility] nursing team will not follow, but will remain available to this patient, the nursing and medical teams.  Please re-consult if needed.  Thank you for inviting [place] to  participate in this patient's Plan of Care.  Maudie Flakes, MSN, RN, CNS, [place], Serita Grammes, [employer], [employer] phone:  [phone]

## 2022-01-03 NOTE — Assessment & Plan Note (Signed)
-  Stable overall -Resewn home antihypertensive regimen except for ARB and diuretics at this moment -Heart healthy diet discussed with patient.

## 2022-01-03 NOTE — Assessment & Plan Note (Signed)
-   Patient has expressed not interested in further evaluation or treatment for the cancer -Palliative has seen her in the outpatient setting and planning to establish care more consistently -She is DNR.

## 2022-01-03 NOTE — ED Triage Notes (Signed)
BP 104/80 HR 98 O2 96%  Pt brought in by [facility] for c/o weakness and fall; pt was taken off her lasix a couple of weeks ago by her PCP; pt has increased swelling and weeping to bilateral legs  Pt lives by herself and can usually perform her own ADLs but has been unable for the last 2 days

## 2022-01-03 NOTE — ED Notes (Signed)
Pt's boyfriend took patients belongings (purse, clothes, and watch) home with him.

## 2022-01-04 ENCOUNTER — Inpatient Hospital Stay (HOSPITAL_COMMUNITY): Payer: Medicaid Other

## 2022-01-04 ENCOUNTER — Other Ambulatory Visit: Payer: Medicaid Other | Admitting: Primary Care

## 2022-01-04 ENCOUNTER — Inpatient Hospital Stay: Payer: Medicaid Other | Admitting: Nurse Practitioner

## 2022-01-04 DIAGNOSIS — L03116 Cellulitis of left lower limb: Secondary | ICD-10-CM | POA: Diagnosis not present

## 2022-01-04 DIAGNOSIS — J9611 Chronic respiratory failure with hypoxia: Secondary | ICD-10-CM | POA: Diagnosis not present

## 2022-01-04 DIAGNOSIS — J90 Pleural effusion, not elsewhere classified: Secondary | ICD-10-CM

## 2022-01-04 DIAGNOSIS — N179 Acute kidney failure, unspecified: Secondary | ICD-10-CM | POA: Diagnosis not present

## 2022-01-04 DIAGNOSIS — Z7189 Other specified counseling: Secondary | ICD-10-CM | POA: Diagnosis not present

## 2022-01-04 DIAGNOSIS — N1832 Chronic kidney disease, stage 3b: Secondary | ICD-10-CM | POA: Diagnosis not present

## 2022-01-04 LAB — BASIC METABOLIC PANEL
Anion gap: 7 (ref 5–15)
BUN: 39 mg/dL — ABNORMAL HIGH (ref 8–23)
CO2: 24 mmol/L (ref 22–32)
Calcium: 8.5 mg/dL — ABNORMAL LOW (ref 8.9–10.3)
Chloride: 109 mmol/L (ref 98–111)
Creatinine, Ser: 2.23 mg/dL — ABNORMAL HIGH (ref 0.44–1.00)
GFR, Estimated: 24 mL/min — ABNORMAL LOW (ref >60)
Glucose, Bld: 92 mg/dL (ref 70–99)
Potassium: 4.8 mmol/L (ref 3.5–5.1)
Sodium: 140 mmol/L (ref 135–145)

## 2022-01-04 LAB — CBC
HCT: 33.5 % — ABNORMAL LOW (ref 36.0–46.0)
Hemoglobin: 10.2 g/dL — ABNORMAL LOW (ref 12.0–15.0)
MCH: 29.1 pg (ref 26.0–34.0)
MCHC: 30.4 g/dL (ref 30.0–36.0)
MCV: 95.4 fL (ref 80.0–100.0)
Platelets: 207 10*3/uL (ref 150–400)
RBC: 3.51 MIL/uL — ABNORMAL LOW (ref 3.87–5.11)
RDW: 22.5 % — ABNORMAL HIGH (ref 11.5–15.5)
WBC: 12.5 10*3/uL — ABNORMAL HIGH (ref 4.0–10.5)
nRBC: 0 % (ref 0.0–0.2)

## 2022-01-04 LAB — BRAIN NATRIURETIC PEPTIDE: B Natriuretic Peptide: 228 pg/mL — ABNORMAL HIGH (ref 0.0–100.0)

## 2022-01-04 LAB — PROCALCITONIN: Procalcitonin: 1.36 ng/mL

## 2022-01-04 LAB — MRSA NEXT GEN BY PCR, NASAL: MRSA by PCR Next Gen: DETECTED — AB

## 2022-01-04 MED ORDER — MIDODRINE HCL 5 MG PO TABS
5.0000 mg | ORAL_TABLET | Freq: Three times a day (TID) | ORAL | Status: DC
Start: 2022-01-04 — End: 2022-01-10
  Administered 2022-01-04 – 2022-01-09 (×16): 5 mg
  Filled 2022-01-04 (×17): qty 1

## 2022-01-04 MED ORDER — SODIUM CHLORIDE 0.9 % IV SOLN
1.0000 g | INTRAVENOUS | Status: DC
  Administered 2022-01-04 – 2022-01-06 (×3): 1 g via INTRAVENOUS
  Filled 2022-01-04 (×3): qty 10

## 2022-01-04 NOTE — Assessment & Plan Note (Signed)
Tobacco cessation discussed 

## 2022-01-04 NOTE — Consult Note (Addendum)
Consultation Note Date: 01/04/2022   Patient Name: Veronica Horton  DOB: May 25, 1960  MRN: [id number]  Age / Sex: 61 y.o., female  PCP: [facility] Referring Physician: Orson Eva, MD  Reason for Consultation: Goals of care  HPI/Patient Profile: 61 y.o. 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  She had a PET scan in June 2022 with some concern for residual disease-she elected no further work-up there has not been evidence of cancer progression since.  She has a chronic trach in place, she has history of seizures as well as a benign meningioma, CKD, admitted on 01/03/2022 with lower extremity cellulitis.  Palliative medicine consulted for goals of care.  Primary Decision Maker PATIENT  Discussion: Chart reviewed including labs progress notes and imaging. Veronica Horton was seen in the outpatient palliative clinic by Alda Lea, NP on August 24.  At that visit pain and symptom issues were addressed she has chronic pain in her back and lower extremities.  She also has occasional constipation.  Goals of care at that time were established that she would not want any further treatment or work-up for her cancer and that she would focus on taking 1 day at a time and focusing on her comfort.  Follow-up with outpatient palliative at home throughout for care was requested. Today Veronica Horton is seen sitting up awake alert and oriented. She lives at home with her sister.  Prior to this admission she was able to ambulate she was independent with all of her ADLs. We discussed her goals of care. She feels that she is improving as she can now move her legs.  Her pain is well controlled with current interventions. Her goals of care are to treat what is treatable with the exception of any work-up or further  treatments for her cancer.  She would not want to go to a nursing facility.    SUMMARY OF RECOMMENDATIONS -Continue current level of care with treatment for her cellulitis -She is hopeful to discharge home -Planned outpatient follow-up with Ortho or care they are aware -PMT will follow peripherally  Code Status/Advance Care Planning: DNR   Prognosis:   Unable to determine  Discharge Planning: Home with Palliative Services  Primary Diagnoses: Present on Admission:  Lower extremity cellulitis  Laryngeal cancer ([place])  Hypertension  DVT (deep venous thrombosis) (HCC)  Class 3 obesity ([place])  Acute renal failure superimposed on stage 3b chronic kidney disease ([place])  Chronic venous stasis dermatitis  Tobacco abuse   Review of Systems  Constitutional:  Positive for activity change.  Respiratory:  Negative for shortness of breath.   Cardiovascular:  Positive for leg swelling.    Physical Exam Vitals and nursing note reviewed.  Pulmonary:     Comments: Trach in place Neurological:     Mental Status: She is oriented to person, place, and time.     Vital Signs: BP (!) 94/50 (BP Location: Right Arm) Comment: MD Tat  notified.  Pulse 66   Temp 98.9 F (37.2 C) (Oral)   Resp 16   Ht 5' 5.5" (1.664 m)   Wt 115.7 kg   SpO2 93%   BMI 41.79 kg/m  Pain Scale: 0-10   Pain Score: 0-No pain   SpO2: SpO2: 93 % O2 Device:SpO2: 93 % O2 Flow Rate: .O2 Flow Rate (L/min): 10 L/min  IO: Intake/output summary:  Intake/Output Summary (Last 24 hours) at 01/04/2022 1654 Last data filed at 01/04/2022 1400 Gross per 24 hour  Intake 1180.78 ml  Output 500 ml  Net 680.78 ml    LBM: Last BM Date : 01/02/22 Baseline Weight: Weight: 115.7 kg Most recent weight: Weight: 115.7 kg       Thank you for this consult. Palliative medicine will continue to follow and assist as needed.  Time Total: 75 minutes Greater than 50%  of this time was spent counseling and coordinating care related to  the above assessment and plan.  Signed by: Mariana Kaufman, AGNP-C Palliative Medicine    Please contact Palliative Medicine Team phone at [phone] for questions and concerns.  For individual provider: See Shea Evans

## 2022-01-04 NOTE — Progress Notes (Signed)
   01/04/22 1351  Vitals  Temp 98.9 F (37.2 C)  Temp Source Oral  BP (!) 94/50 (MD Tat notified.)  MAP (mmHg) (!) 64  BP Location Right Arm  BP Method Automatic  Patient Position (if appropriate) Lying  Pulse Rate 66  Pulse Rate Source Monitor  Resp 16  MEWS COLOR  MEWS Score Color Green  Oxygen Therapy  SpO2 93 %  O2 Device Tracheostomy Collar  O2 Flow Rate (L/min) 10 L/min  MEWS Score  MEWS Temp 0  MEWS Systolic 1  MEWS Pulse 0  MEWS RR 0  MEWS LOC 0  MEWS Score 1   MD Tat notified.

## 2022-01-04 NOTE — Hospital Course (Addendum)
61 year old female with a history of COPD, tobacco abuse, chronic respiratory failure with tracheostomy on 6-8 L at home, laryngeal cancer status post tracheostomy, seizure disorder, GERD, hypertension, orthostatic hypotension presenting with generalized weakness and increasing left lower extremity redness and pain.  The patient visited the emergency department at [facility] on 12/27/2026 with lower extremity edema and pain.  The patient was discharged home from the ED with clindamycin with which she had been compliant.  She continued to have left lower extremity pain and edema.  She stated that she had her left leg against the wall approximately 1 week prior to this admission.  She denies any other recent injuries or falls.  She uses a walker at baseline.  She continues to smoke 1/2 pack/day.  She denies any fevers, chills, chest pain, coughing, hemoptysis, nausea, vomiting, diarrhea.  She states that she has chronic shortness of breath which is not any worse than usual. In the ED, the patient was afebrile hemodynamically stable with oxygen saturation 91-93% on 8 L.  WBC 15.2, hemoglobin 10.7, platelets 207,000.  Sodium 140, potassium 4.8, bicarbonate 24, serum creatinine 2.23.  The patient was started on IV vancomycin and IV fluids.  CT chest showed patchy airspace disease in LUL, LLL< RLL.  UA showed >50 WBC.  Urine culture showed E coli.  She was started on ceftriaxone to cover for UTI and aspiration pneumonia.

## 2022-01-04 NOTE — Progress Notes (Signed)
AP [facility]) Hospital Liaison note:  This is a pending outpatient-based Palliative Care patient. Will continue to follow for disposition.  Please call with any outpatient palliative questions or concerns.  Thank you, Lorelee Market, LPN [facility] Liaison [phone]

## 2022-01-04 NOTE — Progress Notes (Signed)
PROGRESS NOTE  Veronica Horton LDJ:[id number] DOB: 03-23-1961 DOA: 01/03/2022 PCP: [facility]  Brief History:  61 year old female with a history of COPD, tobacco abuse, chronic respiratory failure with tracheostomy on 6-8 L at home, laryngeal cancer status post tracheostomy, seizure disorder, GERD, hypertension, orthostatic hypotension presenting with generalized weakness and increasing left lower extremity redness and pain.  The patient visited the emergency department at [facility] on 12/27/2026 with lower extremity edema and pain.  The patient was discharged home from the ED with clindamycin with which she had been compliant.  She continued to have left lower extremity pain and edema.  She stated that she had her left leg against the wall approximately 1 week prior to this admission.  She denies any other recent injuries or falls.  She uses a walker at baseline.  She continues to smoke 1/2 pack/day.  She denies any fevers, chills, chest pain, coughing, hemoptysis, nausea, vomiting, diarrhea.  She states that she has chronic shortness of breath which is not any worse than usual. In the ED, the patient was afebrile hemodynamically stable with oxygen saturation 91-93% on 8 L.  WBC 15.2, hemoglobin 10.7, platelets 207,000.  Sodium 140, potassium 4.8, bicarbonate 24, serum creatinine 2.23.  The patient was started on IV vancomycin and IV fluids.    Assessment and Plan: * Lower extremity cellulitis - Patient not meeting criteria for sepsis currently. -Found with purulent cellulitis bilaterally (affecting left more than right). -Failed outpatient clindamycin -Continue vancomycin -Wound care consult appreciated. -See pictures below  Pleural effusion on left CT chest Check BNP  Acute renal failure superimposed on stage 3b chronic kidney disease (HCC) Baseline creatinine 1.6-1.8 Presented with serum creatinine 2.58 Secondary to volume depletion Patient was told to  stop her furosemide by her PCP recently Continue judicious IV fluids  DVT (deep venous thrombosis) ([place]) - Continue treatment with Eliquis -In the ED bilateral lower extremity ultrasound demonstrating no blood clots.  Chronic venous stasis dermatitis Appreciate wound care consult  Laryngeal cancer [facility]) - Patient has expressed not interested in further evaluation or treatment for the cancer -Palliative has seen her in the outpatient setting and planning to establish care more consistently -She is DNR.  Tracheostomy status (HCC) CHRONIC RESPIRATORY FAILURE WITH HYPOXIA - ON 6-8 L AT [facility] in place; adequate saturation -Continue supportive care.  Tobacco abuse Tobacco cessation discussed  Class 3 obesity (HCC) -Lifestyle modification -Body mass index is 41.79 kg/m.   Hypertension -Stable overall -Holding home antihypertensive medications secondary to soft BP           Family Communication:   no Family at bedside  Consultants:  none  Code Status:  DNR  DVT Prophylaxis:  apixaban   Procedures: As Listed in Progress Note Above  Antibiotics: Vanc 95>>     Subjective: She complains of bilateral leg pain and edema.  She denies any fevers, chills, chest pain, hemoptysis, nausea, vomiting, direct abdominal pain.  Objective: Vitals:   01/03/22 1500 01/03/22 1700 01/03/22 2000 01/04/22 0714  BP: (!) 117/58 129/61 (!) 100/59 96/61  Pulse: 81 86 67 74  Resp: '13 16 16 16  '$ Temp:  98 F (36.7 C) 98.2 F (36.8 C) 98.2 F (36.8 C)  TempSrc:   Axillary   SpO2: 93% 91%  96%  Weight:      Height:        Intake/Output Summary (Last 24 hours) at 01/04/2022 0813  Last data filed at 01/04/2022 0500 Gross per 24 hour  Intake 820.78 ml  Output 500 ml  Net 320.78 ml   Weight change:  Exam:  General:  Pt is alert, follows commands appropriately, not in acute distress HEENT: No icterus, No thrush, No neck mass, [place]/AT Cardiovascular: RRR, S1/S2, no rubs, no  gallops Respiratory: Upper airway wheezing.  Bibasilar crackles. Abdomen: Soft/+BS, non tender, non distended, no guarding Extremities: 1 + LE edema,  No petechiae, No rashes, no synovitis      Data Reviewed: I have personally reviewed following labs and imaging studies Basic Metabolic Panel: Recent Labs  Lab 01/03/22 1204 01/03/22 1239 01/04/22 0325  NA 136  --  140  K 5.9*  --  4.8  CL 105  --  109  CO2 22  --  24  GLUCOSE 93  --  92  BUN 39*  --  39*  CREATININE 2.58*  --  2.23*  CALCIUM 8.6*  --  8.5*  MG  --  2.6*  --    Liver Function Tests: Recent Labs  Lab 01/03/22 1204  AST 20  ALT 14  ALKPHOS 130*  BILITOT 1.3*  PROT 6.8  ALBUMIN 3.2*   No results for input(s): "LIPASE", "AMYLASE" in the last 168 hours. No results for input(s): "AMMONIA" in the last 168 hours. Coagulation Profile: No results for input(s): "INR", "PROTIME" in the last 168 hours. CBC: Recent Labs  Lab 01/03/22 1204 01/04/22 0325  WBC 15.2* 12.5*  NEUTROABS 14.3*  --   HGB 10.7* 10.2*  HCT 34.5* 33.5*  MCV 93.5 95.4  PLT 211 207   Cardiac Enzymes: No results for input(s): "CKTOTAL", "CKMB", "CKMBINDEX", "TROPONINI" in the last 168 hours. BNP: Invalid input(s): "POCBNP" CBG: Recent Labs  Lab 01/03/22 1238  GLUCAP 103*   HbA1C: No results for input(s): "HGBA1C" in the last 72 hours. Urine analysis:    Component Value Date/Time   COLORURINE YELLOW 01/03/2022 1820   APPEARANCEUR HAZY (A) 01/03/2022 1820   LABSPEC 1.012 01/03/2022 1820   PHURINE 5.0 01/03/2022 1820   GLUCOSEU NEGATIVE 01/03/2022 1820   HGBUR LARGE (A) 01/03/2022 1820   BILIRUBINUR NEGATIVE 01/03/2022 1820   KETONESUR NEGATIVE 01/03/2022 1820   PROTEINUR 30 (A) 01/03/2022 1820   UROBILINOGEN 0.2 09/25/2010 0530   NITRITE POSITIVE (A) 01/03/2022 1820   LEUKOCYTESUR LARGE (A) 01/03/2022 1820   Sepsis Labs: '@LABRCNTIP'$ (procalcitonin:4,lacticidven:4) ) Recent Results (from the past 240 hour(s))   Culture, blood (routine x 2)     Status: None (Preliminary result)   Collection Time: 01/03/22 12:04 PM   Specimen: BLOOD RIGHT HAND  Result Value Ref Range Status   Specimen Description BLOOD RIGHT HAND  Final   Special Requests   Final    BOTTLES DRAWN AEROBIC AND ANAEROBIC Blood Culture results may not be optimal due to an inadequate volume of blood received in culture bottles   Culture   Final    NO GROWTH < 24 HOURS Performed at [facility], [address], [place], [place] [address]    Report Status PENDING  Incomplete  Culture, blood (routine x 2)     Status: None (Preliminary result)   Collection Time: 01/03/22  1:45 PM   Specimen: BLOOD  Result Value Ref Range Status   Specimen Description BLOOD LEFT ANTECUBITAL  Final   Special Requests   Final    BOTTLES DRAWN AEROBIC AND ANAEROBIC Blood Culture results may not be optimal due to an inadequate volume of blood  received in culture bottles   Culture   Final    NO GROWTH < 24 HOURS Performed at [facility], [address], [place], [place] [address]    Report Status PENDING  Incomplete     Scheduled Meds:  apixaban  5 mg Oral BID   budesonide  0.5 mg Nebulization BID   citalopram  20 mg Oral Daily   famotidine  20 mg Per Tube Daily   folic acid  1 mg Per Tube Daily   gabapentin  300 mg Per Tube QHS   leptospermum manuka honey  1 Application Topical Daily   levETIRAcetam  750 mg Per Tube BID   levothyroxine  150 mcg Oral Daily   midodrine  5 mg Per Tube BID WC   sodium zirconium cyclosilicate  10 g Oral Daily   cyanocobalamin  500 mcg Per Tube Daily   Continuous Infusions:  sodium chloride 75 mL/hr at 01/04/22 0531   [START ON 01/05/2022] vancomycin      Procedures/Studies: DG Chest Portable 1 View  Result Date: 01/03/2022 CLINICAL DATA:  Provided history: Shortness of breath. EXAM: PORTABLE CHEST 1 VIEW COMPARISON:  Prior chest radiographs 08/08/2021 and earlier. FINDINGS: A tracheostomy tube is noted.  Cardiomegaly. Central pulmonary vascular congestion. Prominence of the interstitial lung markings bilaterally, compatible with pulmonary interstitial edema. Irregular and nodular opacities within the right lung base. The nodular opacities measure up to 16 mm. Irregular opacities within the left lung base. No evidence of pneumothorax. No acute bony abnormality identified. IMPRESSION: Cardiomegaly with pulmonary interstitial edema. Opacities within bilateral lung bases, which may reflect atelectasis and/or pneumonia. Superimposed more nodular opacities within the right lung base. A chest CT is recommended to better assess for possible pulmonary nodules at this site. Small left pleural effusion, which may be partially loculated. Electronically Signed   By: Kellie Simmering D.O.   On: 01/03/2022 14:50   US Venous Img Lower Bilateral  Result Date: 01/03/2022 CLINICAL DATA:  Bilateral open wounds. EXAM: BILATERAL LOWER EXTREMITY VENOUS DOPPLER ULTRASOUND TECHNIQUE: Gray-scale sonography with graded compression, as well as color Doppler and duplex ultrasound were performed to evaluate the lower extremity deep venous systems from the level of the common femoral vein and including the common femoral, femoral, profunda femoral, popliteal and calf veins including the posterior tibial, peroneal and gastrocnemius veins when visible. The superficial great saphenous vein was also interrogated. Spectral Doppler was utilized to evaluate flow at rest and with distal augmentation maneuvers in the common femoral, femoral and popliteal veins. COMPARISON:  None Available. FINDINGS: RIGHT LOWER EXTREMITY Common Femoral Vein: No evidence of thrombus. Normal compressibility, respiratory phasicity and response to augmentation. Saphenofemoral Junction: No evidence of thrombus. Normal compressibility and flow on color Doppler imaging. Profunda Femoral Vein: No evidence of thrombus. Normal compressibility and flow on color Doppler imaging.  Femoral Vein: No evidence of thrombus. Normal compressibility, respiratory phasicity and response to augmentation. Popliteal Vein: No evidence of thrombus. Normal compressibility, respiratory phasicity and response to augmentation. Calf Veins: Limited evaluation. Superficial Great Saphenous Vein: Limited evaluation. Other Findings:  Large amount of subcutaneous edema. LEFT LOWER EXTREMITY Common Femoral Vein: No evidence of thrombus. Normal compressibility, respiratory phasicity and response to augmentation. Saphenofemoral Junction: No evidence of thrombus. Normal compressibility and flow on color Doppler imaging. Profunda Femoral Vein: No evidence of thrombus. Normal compressibility and flow on color Doppler imaging. Femoral Vein: No evidence of thrombus. Normal compressibility, respiratory phasicity and response to augmentation. Popliteal Vein: No evidence of thrombus. Normal  compressibility, respiratory phasicity and response to augmentation. Calf Veins: Limited evaluation. Superficial Great Saphenous Vein: No evidence of thrombus. Normal compressibility. Other Findings:  Subcutaneous edema. IMPRESSION: No evidence of deep venous thrombosis in either lower extremity. However, the study has technical limitations due to body habitus and edema. Limited evaluation the bilateral deep calf veins. Electronically Signed   By: Markus Daft M.D.   On: 01/03/2022 13:45    Orson Eva, DO  [facility]  If 7PM-7AM, please contact night-coverage [email or web address] Password [email or web address] 01/04/2022, 8:13 AM   LOS: 1 day

## 2022-01-04 NOTE — Progress Notes (Signed)
SLP Cancellation Note  Patient Details Name: Veronica Horton MRN: [id number] DOB: [birth year]   Cancelled treatment:       Reason Eval/Treat Not Completed: Fatigue/lethargy limiting ability to participate. Pt has been reportedly very alert and responsive all day, however was very lethargic upon my arrival and only opened her eyes for brief periods. She did shake her head no when asked if she needed a new PMSV, however, this will be further assessed by SLP tomorrow. PMSV is still at bedside in pink denture cup. Thank you,  Dejan Angert H. Roddie Mc, CCC-SLP Speech Language Pathologist    Veronica Horton 01/04/2022, 1:43 PM

## 2022-01-04 NOTE — Progress Notes (Signed)
RT called to patients room per patient refusing to remove Nada Boozer despite her O2 levels dropping. Upon entering room RT educated patient on the importance of removing the Nada Boozer when she is not talking to anyone in hopes of maintaining an acceptable sat. Patient initially became slightly irritated but after education she agreed to be compliant and remove the [employer] when not speaking. Passy Damita Lack remains at bedside in pink denture cup when needed. RN aware.

## 2022-01-04 NOTE — TOC Progression Note (Signed)
Transition of Care [facility]) - Progression Note    Patient Details  Name: Veronica Horton MRN: [id number] Date of Birth: 1960/05/16  Transition of Care [facility]) CM/SW Contact  Salome Arnt, Coy Phone Number: 01/04/2022, 10:14 AM  Clinical Narrative:   Transition of Care [facility]) Screening Note   Patient Details  Name: Veronica Horton Date of Birth: 02/02/1961   Transition of Care [facility]) CM/SW Contact:    Salome Arnt, LCSW Phone Number: 01/04/2022, 10:14 AM  TOC received consult for home health/DME. Will follow and address any d/c needs.   Transition of Care Department [facility]) has reviewed patient and no TOC needs have been identified at this time. We will continue to monitor patient advancement through interdisciplinary progression rounds. If new patient transition needs arise, please place a TOC consult.            Expected Discharge Plan and Services                                                 Social Determinants of Health (SDOH) Interventions    Readmission Risk Interventions    08/09/2021    2:37 PM 04/26/2020   10:28 AM  Readmission Risk Prevention Plan  Transportation Screening Complete Complete  Medication Review ([place]) Complete Complete  PCP or Specialist appointment within 3-5 days of discharge  Complete  HRI or [place] Complete Complete  SW Recovery Care/Counseling Consult Complete Complete  Palliative Care Screening Not Applicable Not [place] Not Applicable Patient Refused

## 2022-01-04 NOTE — Progress Notes (Signed)
RT called to patients room per patients sats dropping. Upon entering room patients sats were in the 70's and patient was coughing profusely. RT suctioned patient extracting a copious amount of secretions as well as a mucus plug. RT obtained inner cannulas from materials and inserted one into her trach and also placed patient on ATC at 8L 35%. Patient is tolerating well at this time. RT educated patient on the importance of leaving ATC on as well as inner cannula in. Patient agrees to be compliant. Extra trach supplies are left at bedside.

## 2022-01-04 NOTE — Assessment & Plan Note (Addendum)
Appreciate wound care consult ----Care to the LEs will be to wash with soap and water daily, rinse and dry, then dress left LE wounds with MediHoney (antimicrobial gel) topped with silicone foam. Mild compression to both legs will be with a dry boot (ie., Kerlix wrapped from just below toes to just below knees, then topped with 6-inch ACE bandages). Floatation of heels by placing the feet into Prevalon boots is requested.

## 2022-01-04 NOTE — Assessment & Plan Note (Signed)
CT chest Check BNP

## 2022-01-05 DIAGNOSIS — N179 Acute kidney failure, unspecified: Secondary | ICD-10-CM | POA: Diagnosis not present

## 2022-01-05 DIAGNOSIS — J690 Pneumonitis due to inhalation of food and vomit: Secondary | ICD-10-CM

## 2022-01-05 DIAGNOSIS — L03116 Cellulitis of left lower limb: Secondary | ICD-10-CM | POA: Diagnosis not present

## 2022-01-05 DIAGNOSIS — L03115 Cellulitis of right lower limb: Secondary | ICD-10-CM | POA: Diagnosis not present

## 2022-01-05 LAB — CBC
HCT: 30 % — ABNORMAL LOW (ref 36.0–46.0)
Hemoglobin: 9.2 g/dL — ABNORMAL LOW (ref 12.0–15.0)
MCH: 29.1 pg (ref 26.0–34.0)
MCHC: 30.7 g/dL (ref 30.0–36.0)
MCV: 94.9 fL (ref 80.0–100.0)
Platelets: 173 10*3/uL (ref 150–400)
RBC: 3.16 MIL/uL — ABNORMAL LOW (ref 3.87–5.11)
RDW: 22 % — ABNORMAL HIGH (ref 11.5–15.5)
WBC: 8.1 10*3/uL (ref 4.0–10.5)
nRBC: 0 % (ref 0.0–0.2)

## 2022-01-05 LAB — BASIC METABOLIC PANEL
Anion gap: 4 — ABNORMAL LOW (ref 5–15)
BUN: 37 mg/dL — ABNORMAL HIGH (ref 8–23)
CO2: 24 mmol/L (ref 22–32)
Calcium: 8.2 mg/dL — ABNORMAL LOW (ref 8.9–10.3)
Chloride: 110 mmol/L (ref 98–111)
Creatinine, Ser: 2.05 mg/dL — ABNORMAL HIGH (ref 0.44–1.00)
GFR, Estimated: 27 mL/min — ABNORMAL LOW (ref >60)
Glucose, Bld: 73 mg/dL (ref 70–99)
Potassium: 4.1 mmol/L (ref 3.5–5.1)
Sodium: 138 mmol/L (ref 135–145)

## 2022-01-05 MED ORDER — POLYETHYLENE GLYCOL 3350 17 G PO PACK
17.0000 g | PACK | Freq: Every day | ORAL | Status: DC
  Administered 2022-01-05 – 2022-01-09 (×3): 17 g via ORAL
  Filled 2022-01-05 (×6): qty 1

## 2022-01-05 MED ORDER — BISACODYL 10 MG RE SUPP
10.0000 mg | Freq: Once | RECTAL | Status: AC
  Administered 2022-01-05: 10 mg via RECTAL
  Filled 2022-01-05: qty 1

## 2022-01-05 NOTE — Progress Notes (Signed)
PROGRESS NOTE  Veronica Horton YIF:[id number] DOB: [birth year] DOA: 01/03/2022 PCP: [facility]  Brief History:  61 year old female with a history of COPD, tobacco abuse, chronic respiratory failure with tracheostomy on 6-8 L at home, laryngeal cancer status post tracheostomy, seizure disorder, GERD, hypertension, orthostatic hypotension presenting with generalized weakness and increasing left lower extremity redness and pain.  The patient visited the emergency department at [facility] on 12/27/2026 with lower extremity edema and pain.  The patient was discharged home from the ED with clindamycin with which she had been compliant.  She continued to have left lower extremity pain and edema.  She stated that she had her left leg against the wall approximately 1 week prior to this admission.  She denies any other recent injuries or falls.  She uses a walker at baseline.  She continues to smoke 1/2 pack/day.  She denies any fevers, chills, chest pain, coughing, hemoptysis, nausea, vomiting, diarrhea.  She states that she has chronic shortness of breath which is not any worse than usual. In the ED, the patient was afebrile hemodynamically stable with oxygen saturation 91-93% on 8 L.  WBC 15.2, hemoglobin 10.7, platelets 207,000.  Sodium 140, potassium 4.8, bicarbonate 24, serum creatinine 2.23.  The patient was started on IV vancomycin and IV fluids.  CT chest showed patchy airspace disease in LUL, LLL< RLL.  UA showed >50 WBC.  Urine culture showed E coli.  She was started on ceftriaxone to cover for UTI and aspiration pneumonia.     Assessment and Plan: * Lower extremity cellulitis - Patient not meeting criteria for sepsis currently. -Found with purulent cellulitis bilaterally (affecting left more than right). -Failed outpatient clindamycin -Continue vancomycin>>slowly improving -Wound care consult appreciated. -See pictures below  Aspiration pneumonitis (HCC) CT chest  as discussed above Continue ceftriaxone Speech therapy eval  Acute renal failure superimposed on stage 3b chronic kidney disease (HCC) Baseline creatinine 1.6-1.8 Presented with serum creatinine 2.58 Secondary to volume depletion Patient was told to stop her furosemide by her PCP recently Continue judicious IV fluids  DVT (deep venous thrombosis) ([place]) - Continue treatment with Eliquis -In the ED bilateral lower extremity ultrasound demonstrating no blood clots.  Chronic venous stasis dermatitis Appreciate wound care consult  Laryngeal cancer [facility]) - Patient has expressed not interested in further evaluation or treatment for the cancer -Palliative has seen her in the outpatient setting and planning to establish care more consistently -She is DNR.  Tracheostomy status ([place]) CHRONIC RESPIRATORY FAILURE WITH HYPOXIA - ON 6-8 L AT [place] in place; adequate saturation -stable  Tobacco abuse Tobacco cessation discussed  Class 3 obesity (HCC) -Lifestyle modification -Body mass index is 41.79 kg/m.   Hypertension -Stable overall -Holding home antihypertensive medications secondary to soft BP      Family Communication:   no Family at bedside  Consultants:  palliative  Code Status:  FULL   DVT Prophylaxis:  apixaban   Procedures: As Listed in Progress Note Above  Antibiotics: Vanc 9/5 Ceftriaxone 9/7>>       Subjective: Patient states right leg pain and edema are slowly improving.  She denies f/c, cp, sob, n/v/d, abd pain  Objective: Vitals:   01/05/22 0745 01/05/22 0751 01/05/22 1100 01/05/22 1158  BP:      Pulse:    75  Resp:    20  Temp:      TempSrc:      SpO2: 91%  91% 91% 92%  Weight:      Height:        Intake/Output Summary (Last 24 hours) at 01/05/2022 1421 Last data filed at 01/05/2022 0800 Gross per 24 hour  Intake 340 ml  Output 1350 ml  Net -1010 ml   Weight change:  Exam:  General:  Pt is alert, follows commands  appropriately, not in acute distress HEENT: No icterus, No thrush, No neck mass, [place]/AT Cardiovascular: RRR, S1/S2, no rubs, no gallops Respiratory: bilateral rales. Abdomen: Soft/+BS, non tender, non distended, no guarding Extremities: R leg pictures>>       Data Reviewed: I have personally reviewed following labs and imaging studies Basic Metabolic Panel: Recent Labs  Lab 01/03/22 1204 01/03/22 1239 01/04/22 0325 01/05/22 0341  NA 136  --  140 138  K 5.9*  --  4.8 4.1  CL 105  --  109 110  CO2 22  --  24 24  GLUCOSE 93  --  92 73  BUN 39*  --  39* 37*  CREATININE 2.58*  --  2.23* 2.05*  CALCIUM 8.6*  --  8.5* 8.2*  MG  --  2.6*  --   --    Liver Function Tests: Recent Labs  Lab 01/03/22 1204  AST 20  ALT 14  ALKPHOS 130*  BILITOT 1.3*  PROT 6.8  ALBUMIN 3.2*   No results for input(s): "LIPASE", "AMYLASE" in the last 168 hours. No results for input(s): "AMMONIA" in the last 168 hours. Coagulation Profile: No results for input(s): "INR", "PROTIME" in the last 168 hours. CBC: Recent Labs  Lab 01/03/22 1204 01/04/22 0325 01/05/22 0341  WBC 15.2* 12.5* 8.1  NEUTROABS 14.3*  --   --   HGB 10.7* 10.2* 9.2*  HCT 34.5* 33.5* 30.0*  MCV 93.5 95.4 94.9  PLT 211 207 173   Cardiac Enzymes: No results for input(s): "CKTOTAL", "CKMB", "CKMBINDEX", "TROPONINI" in the last 168 hours. BNP: Invalid input(s): "POCBNP" CBG: Recent Labs  Lab 01/03/22 1238  GLUCAP 103*   HbA1C: No results for input(s): "HGBA1C" in the last 72 hours. Urine analysis:    Component Value Date/Time   COLORURINE YELLOW 01/03/2022 1820   APPEARANCEUR HAZY (A) 01/03/2022 1820   LABSPEC 1.012 01/03/2022 1820   PHURINE 5.0 01/03/2022 1820   GLUCOSEU NEGATIVE 01/03/2022 1820   HGBUR LARGE (A) 01/03/2022 1820   BILIRUBINUR NEGATIVE 01/03/2022 1820   KETONESUR NEGATIVE 01/03/2022 1820   PROTEINUR 30 (A) 01/03/2022 1820   UROBILINOGEN 0.2 09/25/2010 0530   NITRITE POSITIVE (A)  01/03/2022 1820   LEUKOCYTESUR LARGE (A) 01/03/2022 1820   Sepsis Labs: '@LABRCNTIP'$ (procalcitonin:4,lacticidven:4) ) Recent Results (from the past 240 hour(s))  Culture, blood (routine x 2)     Status: None (Preliminary result)   Collection Time: 01/03/22 12:04 PM   Specimen: BLOOD RIGHT HAND  Result Value Ref Range Status   Specimen Description BLOOD RIGHT HAND  Final   Special Requests   Final    BOTTLES DRAWN AEROBIC AND ANAEROBIC Blood Culture results may not be optimal due to an inadequate volume of blood received in culture bottles   Culture   Final    NO GROWTH 2 DAYS Performed at [facility], [address], [place], [place] [address]    Report Status PENDING  Incomplete  Culture, blood (routine x 2)     Status: None (Preliminary result)   Collection Time: 01/03/22  1:45 PM   Specimen: BLOOD  Result Value Ref Range Status  Specimen Description BLOOD LEFT ANTECUBITAL  Final   Special Requests   Final    BOTTLES DRAWN AEROBIC AND ANAEROBIC Blood Culture results may not be optimal due to an inadequate volume of blood received in culture bottles   Culture   Final    NO GROWTH 2 DAYS Performed at [facility], [address], [place], [place] [address]    Report Status PENDING  Incomplete  MRSA Next Gen by PCR, Nasal     Status: Abnormal   Collection Time: 01/04/22  8:04 AM   Specimen: Nasal Mucosa; Nasal Swab  Result Value Ref Range Status   MRSA by PCR Next Gen DETECTED (A) NOT DETECTED Final    Comment: RESULT CALLED TO, READ BACK BY AND VERIFIED WITH: Elijah Birk RN [phone] K FORSYTH (NOTE) The GeneXpert MRSA Assay (FDA approved for NASAL specimens only), is one component of a comprehensive MRSA colonization surveillance program. It is not intended to diagnose MRSA infection nor to guide or monitor treatment for MRSA infections. Test performance is not FDA approved in patients less than 41 years old. Performed at [facility], [address] [place], [place]  [address]   Urine Culture     Status: Abnormal (Preliminary result)   Collection Time: 01/04/22 12:28 PM   Specimen: Urine, Clean Catch  Result Value Ref Range Status   Specimen Description   Final    URINE, CLEAN CATCH Performed at [facility], [address], [place], [place] [address]    Special Requests   Final    NONE Performed at [facility], [address] [place],  [address]    Culture (A)  Final    20,000 COLONIES/mL ESCHERICHIA COLI SUSCEPTIBILITIES TO FOLLOW Performed at [facility], [place] [address], [place],  [address]    Report Status PENDING  Incomplete     Scheduled Meds:  apixaban  5 mg Oral BID   budesonide  0.5 mg Nebulization BID   citalopram  20 mg Oral Daily   famotidine  20 mg Per Tube Daily   folic acid  1 mg Per Tube Daily   leptospermum manuka honey  1 Application Topical Daily   levETIRAcetam  750 mg Per Tube BID   levothyroxine  150 mcg Oral Daily   midodrine  5 mg Per Tube TID WC   cyanocobalamin  500 mcg Per Tube Daily   Continuous Infusions:  cefTRIAXone (ROCEPHIN)  IV 1 g (01/04/22 1551)   vancomycin      Procedures/Studies: CT CHEST WO CONTRAST  Result Date: 01/04/2022 CLINICAL DATA:  A 61 year old female with chronic dyspnea presents for evaluation in the setting of abnormal chest radiograph. EXAM: CT CHEST WITHOUT CONTRAST TECHNIQUE: Multidetector CT imaging of the chest was performed following the standard protocol without IV contrast. RADIATION DOSE REDUCTION: This exam was performed according to the departmental dose-optimization program which includes automated exposure control, adjustment of the mA and/or kV according to patient size and/or use of iterative reconstruction technique. COMPARISON:  December of 2021 FINDINGS: Cardiovascular: Scattered aortic atherosclerosis. Three-vessel coronary artery disease. Normal heart size without pericardial effusion or nodularity. Aberrant RIGHT subclavian artery. Mild engorgement  of central pulmonary vasculature. Limited assessment of cardiovascular structures given lack of intravenous contrast. Mediastinum/Nodes: Tracheostomy tube terminating in the proximal trachea. No signs of adenopathy in the chest. No acute process in the mediastinum. Esophagus is grossly normal. Lungs/Pleura: Patchy airspace disease in the chest in LEFT upper lobe, lingula, LEFT lower lobe and RIGHT  lung base. Volume loss in the LEFT lung base associated also with consolidative changes and material in bronchial structures. No pneumothorax. Trace pleural effusions. Airways are otherwise patent. Upper Abdomen: Incidental imaging of upper abdominal contents without acute process to the extent evaluated. Musculoskeletal: Spinal degenerative changes. No acute or destructive bone process. Rib fractures of varying ages, no acute fractures most are chronic or subacute and without displacement. Fracture at the T9 level with 20-30% loss of height. Vacuum phenomenon within the fracture site. No surrounding stranding. T11 with loss of height as well. These have occurred since December of 2021. No listhesis. IMPRESSION: 1. Multifocal pneumonia with acute on chronic sequela of aspiration favored at the LEFT lung base. 2. Age-indeterminate compression fractures potentially subacute/recent at the T9 level and T11 level are new since 2021. Correlate with any history of prior fracture to these areas and with any current symptoms. 3. Aortic atherosclerosis and coronary artery disease. Aortic Atherosclerosis (ICD10-I70.0). Electronically Signed   By: Zetta Bills M.D.   On: 01/04/2022 17:23   DG Chest Portable 1 View  Result Date: 01/03/2022 CLINICAL DATA:  Provided history: Shortness of breath. EXAM: PORTABLE CHEST 1 VIEW COMPARISON:  Prior chest radiographs 08/08/2021 and earlier. FINDINGS: A tracheostomy tube is noted. Cardiomegaly. Central pulmonary vascular congestion. Prominence of the interstitial lung markings bilaterally,  compatible with pulmonary interstitial edema. Irregular and nodular opacities within the right lung base. The nodular opacities measure up to 16 mm. Irregular opacities within the left lung base. No evidence of pneumothorax. No acute bony abnormality identified. IMPRESSION: Cardiomegaly with pulmonary interstitial edema. Opacities within bilateral lung bases, which may reflect atelectasis and/or pneumonia. Superimposed more nodular opacities within the right lung base. A chest CT is recommended to better assess for possible pulmonary nodules at this site. Small left pleural effusion, which may be partially loculated. Electronically Signed   By: Kellie Simmering D.O.   On: 01/03/2022 14:50   US Venous Img Lower Bilateral  Result Date: 01/03/2022 CLINICAL DATA:  Bilateral open wounds. EXAM: BILATERAL LOWER EXTREMITY VENOUS DOPPLER ULTRASOUND TECHNIQUE: Gray-scale sonography with graded compression, as well as color Doppler and duplex ultrasound were performed to evaluate the lower extremity deep venous systems from the level of the common femoral vein and including the common femoral, femoral, profunda femoral, popliteal and calf veins including the posterior tibial, peroneal and gastrocnemius veins when visible. The superficial great saphenous vein was also interrogated. Spectral Doppler was utilized to evaluate flow at rest and with distal augmentation maneuvers in the common femoral, femoral and popliteal veins. COMPARISON:  None Available. FINDINGS: RIGHT LOWER EXTREMITY Common Femoral Vein: No evidence of thrombus. Normal compressibility, respiratory phasicity and response to augmentation. Saphenofemoral Junction: No evidence of thrombus. Normal compressibility and flow on color Doppler imaging. Profunda Femoral Vein: No evidence of thrombus. Normal compressibility and flow on color Doppler imaging. Femoral Vein: No evidence of thrombus. Normal compressibility, respiratory phasicity and response to augmentation.  Popliteal Vein: No evidence of thrombus. Normal compressibility, respiratory phasicity and response to augmentation. Calf Veins: Limited evaluation. Superficial Great Saphenous Vein: Limited evaluation. Other Findings:  Large amount of subcutaneous edema. LEFT LOWER EXTREMITY Common Femoral Vein: No evidence of thrombus. Normal compressibility, respiratory phasicity and response to augmentation. Saphenofemoral Junction: No evidence of thrombus. Normal compressibility and flow on color Doppler imaging. Profunda Femoral Vein: No evidence of thrombus. Normal compressibility and flow on color Doppler imaging. Femoral Vein: No evidence of thrombus. Normal compressibility, respiratory phasicity and response to augmentation. Popliteal Vein:  No evidence of thrombus. Normal compressibility, respiratory phasicity and response to augmentation. Calf Veins: Limited evaluation. Superficial Great Saphenous Vein: No evidence of thrombus. Normal compressibility. Other Findings:  Subcutaneous edema. IMPRESSION: No evidence of deep venous thrombosis in either lower extremity. However, the study has technical limitations due to body habitus and edema. Limited evaluation the bilateral deep calf veins. Electronically Signed   By: Markus Daft M.D.   On: 01/03/2022 13:45    Orson Eva, DO  [facility]  If 7PM-7AM, please contact night-coverage [email or web address] Password [email or web address] 01/05/2022, 2:21 PM   LOS: 2 days

## 2022-01-05 NOTE — TOC Progression Note (Addendum)
Transition of Care [facility]) - Progression Note    Patient Details  Name: Veronica Horton MRN: [id number] Date of Birth: 1961/01/16  [facility] CM/SW Contact  Ihor Gully, LCSW Phone Number: 01/05/2022, 2:30 PM  Clinical Narrative:    [name], [phone], patient became active with [facility] Paramedic program in [place] on the day of admission to the hospital. Spoke with Langston Masker, community health worker, with [employer], [phone]. Discussed that patient has been referred to and declined by [place], [place], [place], [place], [place]. Ms. Mikeal Hawthorne advised that she would also see if she could locate a company for [facility] services.  The community paramedic program does not perform wound care.     Barriers to Discharge: Continued Medical Work up  Expected Discharge Plan and Services                                                 Social Determinants of Health (SDOH) Interventions    Readmission Risk Interventions    08/09/2021    2:37 PM 04/26/2020   10:28 AM  Readmission Risk Prevention Plan  Transportation Screening Complete Complete  Medication Review ([place]) Complete Complete  PCP or Specialist appointment within 3-5 days of discharge  Complete  HRI or [place] Complete Complete  SW Recovery Care/Counseling Consult Complete Complete  Palliative Care Screening Not Applicable Not [place] Not Applicable Patient Refused

## 2022-01-05 NOTE — Assessment & Plan Note (Addendum)
CT chest as discussed above Continue ceftriaxone>>cefdinir>>home with 3 more days Speech therapy eval>>dys 3 with thin

## 2022-01-05 NOTE — Plan of Care (Signed)
  Problem: Acute Rehab PT Goals(only PT should resolve) Goal: Pt Will Go Supine/Side To Sit Outcome: Progressing Flowsheets (Taken 01/05/2022 1205) Pt will go Supine/Side to Sit:  with min guard assist  with minimal assist Goal: Patient Will Transfer Sit To/From Stand Outcome: Progressing Flowsheets (Taken 01/05/2022 1205) Patient will transfer sit to/from stand: with minimal assist Goal: Pt Will Transfer Bed To Chair/Chair To Bed Outcome: Progressing Flowsheets (Taken 01/05/2022 1205) Pt will Transfer Bed to Chair/Chair to Bed: with min assist Goal: Pt Will Ambulate Outcome: Progressing Flowsheets (Taken 01/05/2022 1205) Pt will Ambulate:  15 feet  with minimal assist  with moderate assist  with rolling walker   12:05 PM, 01/05/22 Lonell Grandchild, MPT Physical Therapist with [facility] [phone] office [phone] mobile phone

## 2022-01-05 NOTE — Evaluation (Signed)
Physical Therapy Evaluation Patient Details Name: Veronica Horton MRN: [id number] DOB: 04/25/1961 Today's Date: 01/05/2022  History of Present Illness  Veronica Horton is a 61 y.o. 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  Patient reports recent visit to ED in [facility] where she was found with cellulitis and placed on clindamycin.  Despite taking the antibiotics symptoms continue to worsen and she feel unable to ambulate due to ongoing pain and swelling.  Patient reports no fever, no chills, no nausea, no vomiting, no chest pain, no worsening breathing or sick contacts.  Patient reported that herself and her sister are the ones changing wound cares at there is no home health agency that we will provide home health nurses to her house.   Clinical Impression  Patient demonstrates slow labored movement for sitting up at bedside, had difficulty completing sit to stands due to BLE weakness, unsteady on feet and limited to a few side steps before having to sit due to fatigue and swelling in legs.  Patient tolerated sitting up in chair after therapy.  Patient will benefit from continued skilled physical therapy in hospital and recommended venue below to increase strength, balance, endurance for safe ADLs and gait.          Recommendations for follow up therapy are one component of a multi-disciplinary discharge planning process, led by the attending physician.  Recommendations may be updated based on patient status, additional functional criteria and insurance authorization.  Follow Up Recommendations Home health PT      Assistance  Recommended at Discharge Set up Supervision/Assistance  Patient can return home with the following  A little help with bathing/dressing/bathroom;A lot of help with walking and/or transfers;Assistance with cooking/housework;Help with stairs or ramp for entrance    Equipment Recommendations None recommended by PT  Recommendations for Other Services       Functional Status Assessment Patient has had a recent decline in their functional status and demonstrates the ability to make significant improvements in function in a reasonable and predictable amount of time.     Precautions / Restrictions Precautions Precautions: Fall Restrictions Weight Bearing Restrictions: No      Mobility  Bed Mobility Overal bed mobility: Needs Assistance Bed Mobility: Supine to Sit     Supine to sit: Min assist, Mod assist     General bed mobility comments: increased time, labored movement    Transfers Overall transfer level: Needs assistance Equipment used: Rolling walker (2 wheels) Transfers: Sit to/from Stand, Bed to chair/wheelchair/BSC Sit to Stand: Mod assist   Step pivot transfers: Mod assist       General transfer comment: slow labored movement with difficulty completing sit to stands due to BLE weakness    Ambulation/Gait Ambulation/Gait assistance: Mod assist Gait Distance (Feet): 5 Feet Assistive device: Rolling walker (2 wheels) Gait Pattern/deviations: Decreased step length - right, Decreased step length - left, Decreased stride length Gait velocity: slow     General Gait Details: limited to a few slow labored side steps due to generalized weakness, fatigue and swollen legs  Stairs            Wheelchair Mobility    Modified Rankin (Stroke Patients Only)  Balance Overall balance assessment: Needs assistance Sitting-balance support: Feet supported, No upper extremity supported Sitting balance-Leahy Scale: Fair Sitting balance - Comments: fair/good seated  at EOB   Standing balance support: Reliant on assistive device for balance, During functional activity, Bilateral upper extremity supported Standing balance-Leahy Scale: Poor Standing balance comment: fair/poor using RW                             Pertinent Vitals/Pain Pain Assessment Pain Assessment: Faces Faces Pain Scale: Hurts a little bit Pain Location: low back when supine Pain Descriptors / Indicators: Sore Pain Intervention(s): Limited activity within patient's tolerance, Monitored during session, Repositioned    Home Living Family/patient expects to be discharged to:: Private residence Living Arrangements: Spouse/significant other Available Help at Discharge: Friend(s);Available 24 hours/day;Family Type of Home: Mobile home Home Access: Ramped entrance       Home Layout: One level Home Equipment: Conservation officer, nature (2 wheels);Cane - single point;Wheelchair Probation officer (4 wheels)      Prior Function Prior Level of Function : Needs assist       Physical Assist : Mobility (physical);ADLs (physical) Mobility (physical): Bed mobility;Transfers;Gait;Stairs   Mobility Comments: short distanced household ambulator using Rollator ADLs Comments: assisted by family     Hand Dominance   Dominant Hand: Right    Extremity/Trunk Assessment   Upper Extremity Assessment Upper Extremity Assessment: Generalized weakness    Lower Extremity Assessment Lower Extremity Assessment: Generalized weakness    Cervical / Trunk Assessment Cervical / Trunk Assessment: Normal  Communication   Communication: Tracheostomy;Passy-Muir valve  Cognition Arousal/Alertness: Awake/alert Behavior During Therapy: WFL for tasks assessed/performed Overall Cognitive Status: Within Functional Limits for tasks assessed                                          General Comments      Exercises     Assessment/Plan    PT Assessment Patient needs continued PT  services  PT Problem List Decreased strength;Decreased activity tolerance;Decreased balance;Decreased mobility       PT Treatment Interventions DME instruction;Gait training;Stair training;Functional mobility training;Therapeutic activities;Therapeutic exercise;Patient/family education;Balance training    PT Goals (Current goals can be found in the Care Plan section)  Acute Rehab PT Goals Patient Stated Goal: return home with family to assist PT Goal Formulation: With patient Time For Goal Achievement: 01/12/22 Potential to Achieve Goals: Good    Frequency Min 3X/week     Co-evaluation               AM-PAC PT "6 Clicks" Mobility  Outcome Measure Help needed turning from your back to your side while in a flat bed without using bedrails?: A Little Help needed moving from lying on your back to sitting on the side of a flat bed without using bedrails?: A Lot Help needed moving to and from a bed to a chair (including a wheelchair)?: A Lot Help needed standing up from a chair using your arms (e.g., wheelchair or bedside chair)?: A Lot Help needed to walk in hospital room?: A Lot Help needed climbing 3-5 steps with a railing? : Total 6 Click Score: 12    End of Session Equipment Utilized During Treatment: Oxygen Activity Tolerance: Patient tolerated treatment well;Patient limited by fatigue Patient left: in chair;with call bell/phone within reach Nurse Communication: Mobility status PT Visit Diagnosis: Unsteadiness  on feet (R26.81);Other abnormalities of gait and mobility (R26.89);Muscle weakness (generalized) (M62.81)    Time: 2761-4709 PT Time Calculation (min) (ACUTE ONLY): 30 min   Charges:   PT Evaluation $PT Eval Moderate Complexity: 1 Mod PT Treatments $Therapeutic Activity: 23-37 mins        12:02 PM, 01/05/22 Lonell Grandchild, MPT Physical Therapist with [facility] [phone] office [phone] mobile phone

## 2022-01-05 NOTE — Evaluation (Signed)
Clinical/Bedside Swallow Evaluation Patient Details  Name: Veronica Horton MRN: [id number] Date of Birth: 1960/11/03  Today's Date: 01/05/2022 Time: SLP Start Time (ACUTE ONLY): 6967 SLP Stop Time (ACUTE ONLY): 1600 SLP Time Calculation (min) (ACUTE ONLY): 24 min  Past Medical History:  Past Medical History:  Diagnosis Date   Aspiration pneumonitis ([place]) 01/17/2019   Bronchitis    Class 3 obesity ([place]) 01/23/2020   COPD (chronic obstructive pulmonary disease) ([facility])    Degenerative disc disease, lumbar    DVT (deep venous thrombosis) ([place]) 08/08/2021   Hiatal hernia    Hypertension    Seizure ([place]) 11/28/2020   Small bowel obstruction ([place]) 01/13/2019   Throat cancer ([place])    Past Surgical History:  Past Surgical History:  Procedure Laterality Date   BIOPSY  04/12/2020   Procedure: BIOPSY;  Surgeon: Wilford Corner, MD;  Location: [facility];  Service: Gastroenterology;;   CHOLECYSTECTOMY     degenerative bone disease     DIRECT LARYNGOSCOPY N/A 03/13/2020   Procedure: DIRECT LARYNGOSCOPY WITH BIOPSY;  Surgeon: Jason Coop, DO;  Location: [place];  Service: ENT;  Laterality: N/A;   FLEXIBLE SIGMOIDOSCOPY N/A 04/12/2020   Procedure: Beryle Quant;  Surgeon: Wilford Corner, MD;  Location: [facility];  Service: Gastroenterology;  Laterality: N/A;   INCISIONAL HERNIA REPAIR N/A 01/15/2019   Procedure: [name] HERNIORRHAPHY WITH MESH;  Surgeon: [name], MD;  Location: [facility];  Service: General;  Laterality: N/A;   IR GASTROSTOMY TUBE MOD SED  03/22/2020   IR [place] TUBE CHANGE  07/24/2020   IR RADIOLOGIST EVAL & MGMT  05/07/2020   IR REPLACE G-TUBE SIMPLE WO FLUORO  11/30/2020   OMENTECTOMY N/A 01/15/2019   Procedure: OMENTECTOMY;  Surgeon: [name], MD;  Location: [facility];  Service: General;  Laterality: N/A;   POLYPECTOMY  04/12/2020   Procedure: POLYPECTOMY;  Surgeon: Wilford Corner, MD;  Location: [facility];  Service: Gastroenterology;;    TRACHEOSTOMY TUBE PLACEMENT N/A 03/13/2020   Procedure: AWAKE TRACHEOSTOMY;  Surgeon: Jason Coop, DO;  Location: [place];  Service: ENT;  Laterality: N/A;   HPI:  61 year old female with a history of COPD, tobacco abuse, chronic respiratory failure with tracheostomy on 6-8 L at home, laryngeal cancer status post tracheostomy, seizure disorder, GERD, hypertension, orthostatic hypotension presenting with generalized weakness and increasing left lower extremity redness and pain.  The patient visited the emergency department at [facility] on 12/27/2026 with lower extremity edema and pain.  The patient was discharged home from the ED with clindamycin with which she had been compliant.  She continued to have left lower extremity pain and edema.  She stated that she had her left leg against the wall approximately 1 week prior to this admission.  She denies any other recent injuries or falls.  She uses a walker at baseline.  She continues to smoke 1/2 pack/day.  She denies any fevers, chills, chest pain, coughing, hemoptysis, nausea, vomiting, diarrhea.  She states that she has chronic shortness of breath which is not any worse than usual.  In the ED, the patient was afebrile hemodynamically stable with oxygen saturation 91-93% on 8 L.  WBC 15.2, hemoglobin 10.7, platelets 207,000.  Sodium 140, potassium 4.8, bicarbonate 24, serum creatinine 2.23.  The patient was started on IV vancomycin and IV fluids.     CT chest showed patchy airspace disease in LUL, LLL< RLL.  UA showed >50 WBC.  Urine culture showed E coli.  She was started on ceftriaxone to  cover for UTI and aspiration pneumonia.    Assessment / Plan / Recommendation  Clinical Impression  Pt known to SLP service from previous admissions. Pt last had MBSS 12/01/2020 at [facility] and D3/HTL was recommended. Pt reports that she has not been thickening her liquids since that time and is not agreable to thickening them. She consumed thin liquids via straw sips with  occasional delayed cough/throat clear and consumed soft textures without incident. Pt states that she would not be interested in a laryngectomy (possibly recommended at [facility]), but that she would pursue chemo/radiation if that was recommended to her. She was encouraged to contact Dr. Lanell Persons and/or her oncologist to discuss. She is not interested in pursuing further swallow assessment at this time. SLP gave Pt SLP contact information should she decide to pursue further swallow intervention and she thanked me for the visit. Can continue to order Pt's diet as per her wishes if this is within her goals of care. SLP will sign off. Reviewed with Dr. Carles Collet. SLP Visit Diagnosis: Dysphagia, oropharyngeal phase (R13.12)    Aspiration Risk  Moderate aspiration risk    Diet Recommendation Dysphagia 3 (Mech soft);Thin liquid   Liquid Administration via: Cup Medication Administration: Whole meds with liquid Supervision: Patient able to self feed Compensations: Slow rate;Small sips/bites Postural Changes: Seated upright at 90 degrees;Remain upright for at least 30 minutes after po intake    Other  Recommendations Recommended Consults:  (f/u with radiation oncology) Oral Care Recommendations: Oral care BID Other Recommendations: Clarify dietary restrictions    Recommendations for follow up therapy are one component of a multi-disciplinary discharge planning process, led by the attending physician.  Recommendations may be updated based on patient status, additional functional criteria and insurance authorization.  Follow up Recommendations No SLP follow up      Assistance Recommended at Discharge    Functional Status Assessment Patient has not had a recent decline in their functional status  Frequency and Duration            Prognosis Prognosis for Safe Diet Advancement: Fair Barriers to Reach Goals: Motivation      Swallow Study   General Date of Onset: 01/03/22 HPI: 61 year old female with a  history of COPD, tobacco abuse, chronic respiratory failure with tracheostomy on 6-8 L at home, laryngeal cancer status post tracheostomy, seizure disorder, GERD, hypertension, orthostatic hypotension presenting with generalized weakness and increasing left lower extremity redness and pain.  [facility] with lower extremity edema and pain.  The patient was discharged home from the ED with clindamycin with which she had been compliant.  She continued to have left lower extremity pain and edema.  She stated that she had her left leg against the wall approximately 1 week prior to this admission.  She denies any other recent injuries or falls.  She uses a walker at baseline.  She continues to smoke 1/2 pack/day.  She denies any fevers, chills, chest pain, coughing, hemoptysis, nausea, vomiting, diarrhea.  She states that she has chronic shortness of breath which is not any worse than usual.  In the ED, the patient was afebrile hemodynamically stable with oxygen saturation 91-93% on 8 L.  WBC 15.2, hemoglobin 10.7, platelets 207,000.  Sodium 140, potassium 4.8, bicarbonate 24, serum creatinine 2.23.  The patient was started on IV vancomycin and IV fluids.     CT chest showed patchy airspace disease in LUL, LLL< RLL.  UA showed >50 WBC.  Urine culture showed E coli.  She was started on ceftriaxone to cover for UTI and aspiration pneumonia. Type of Study: Bedside Swallow Evaluation Diet Prior to this Study: Regular;Thin liquids Temperature Spikes Noted: No Respiratory Status: Room air;Trach Collar History of Recent Intubation: No Behavior/Cognition: Alert;Cooperative;Pleasant mood Oral Cavity Assessment: Within Functional Limits Oral Care Completed by SLP: No Oral Cavity - Dentition: Edentulous Vision: Functional for self-feeding Self-Feeding Abilities: Able to feed self Patient Positioning: Upright in chair Baseline Vocal Quality: Hoarse (Pt has  PMSV) Volitional Cough: Strong Volitional Swallow: Able to elicit    Oral/Motor/Sensory Function Overall Oral Motor/Sensory Function: Within functional limits   Ice Chips Ice chips: Not tested   Thin Liquid Thin Liquid: Within functional limits Presentation: Self Fed;Straw    Nectar Thick Nectar Thick Liquid: Not tested   Honey Thick Honey Thick Liquid: Not tested   Puree Puree: Within functional limits Presentation: Spoon   Solid     Solid: Within functional limits Presentation: Self Fed     Thank you,  [name], [place]  Vinita Prentiss 01/05/2022,4:18 PM

## 2022-01-06 DIAGNOSIS — L03116 Cellulitis of left lower limb: Secondary | ICD-10-CM | POA: Diagnosis not present

## 2022-01-06 DIAGNOSIS — L03119 Cellulitis of unspecified part of limb: Secondary | ICD-10-CM

## 2022-01-06 DIAGNOSIS — N179 Acute kidney failure, unspecified: Secondary | ICD-10-CM | POA: Diagnosis not present

## 2022-01-06 DIAGNOSIS — N390 Urinary tract infection, site not specified: Secondary | ICD-10-CM

## 2022-01-06 DIAGNOSIS — J690 Pneumonitis due to inhalation of food and vomit: Secondary | ICD-10-CM | POA: Diagnosis not present

## 2022-01-06 LAB — BASIC METABOLIC PANEL
Anion gap: 8 (ref 5–15)
BUN: 31 mg/dL — ABNORMAL HIGH (ref 8–23)
CO2: 22 mmol/L (ref 22–32)
Calcium: 8 mg/dL — ABNORMAL LOW (ref 8.9–10.3)
Chloride: 107 mmol/L (ref 98–111)
Creatinine, Ser: 1.73 mg/dL — ABNORMAL HIGH (ref 0.44–1.00)
GFR, Estimated: 33 mL/min — ABNORMAL LOW (ref >60)
Glucose, Bld: 92 mg/dL (ref 70–99)
Potassium: 3.8 mmol/L (ref 3.5–5.1)
Sodium: 137 mmol/L (ref 135–145)

## 2022-01-06 LAB — MAGNESIUM: Magnesium: 2.3 mg/dL (ref 1.7–2.4)

## 2022-01-06 LAB — URINE CULTURE: Culture: 20000 — AB

## 2022-01-06 MED ORDER — FUROSEMIDE 10 MG/ML IJ SOLN
40.0000 mg | Freq: Once | INTRAMUSCULAR | Status: AC
  Administered 2022-01-06: 40 mg via INTRAVENOUS
  Filled 2022-01-06: qty 4

## 2022-01-06 MED ORDER — VANCOMYCIN HCL 1250 MG/250ML IV SOLN
1250.0000 mg | INTRAVENOUS | Status: DC
  Administered 2022-01-06 – 2022-01-07 (×2): 1250 mg via INTRAVENOUS
  Filled 2022-01-06 (×2): qty 250

## 2022-01-06 MED ORDER — CEFDINIR 300 MG PO CAPS
300.0000 mg | ORAL_CAPSULE | Freq: Two times a day (BID) | ORAL | Status: DC
  Administered 2022-01-07 – 2022-01-09 (×5): 300 mg via ORAL
  Filled 2022-01-06 (×5): qty 1

## 2022-01-06 NOTE — Progress Notes (Signed)
Physical Therapy Treatment Patient Details Name: Veronica Horton MRN: [id number] DOB: Nov 07, 1960 Today's Date: 01/06/2022   History of Present Illness Veronica Horton is a 61 y.o. 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  Patient reports recent visit to ED in [facility] where she was found with cellulitis and placed on clindamycin.  Despite taking the antibiotics symptoms continue to worsen and she feel unable to ambulate due to ongoing pain and swelling.  Patient reports no fever, no chills, no nausea, no vomiting, no chest pain, no worsening breathing or sick contacts.  Patient reported that herself and her sister are the ones changing wound cares at there is no home health agency that we will provide home health nurses to her house.    PT Comments    Patient not wishing to perform out of bed mobility as she had just returned from sitting in the chair for several hours and was fatigued. She performed supine exercises with difficulty on L secondary to L hip pain. Patient requires assist with LLE exercises involving her hip due to pain/weakness. Patient will benefit from continued skilled physical therapy in hospital and recommended venue below to increase strength, balance, endurance for safe ADLs and gait.   Recommendations for follow up therapy are one component of a multi-disciplinary discharge planning process, led by the attending physician.  Recommendations may be updated based on patient status, additional functional criteria and insurance authorization.  Follow Up Recommendations  Home health PT     Assistance  Recommended at Discharge Set up Supervision/Assistance  Patient can return home with the following A little help with bathing/dressing/bathroom;A lot of help with walking and/or transfers;Assistance with cooking/housework;Help with stairs or ramp for entrance   Equipment Recommendations  None recommended by PT    Recommendations for Other Services       Precautions / Restrictions Precautions Precautions: Fall Restrictions Weight Bearing Restrictions: No     Mobility  Bed Mobility                    Transfers                        Ambulation/Gait                   Stairs             Wheelchair Mobility    Modified Rankin (Stroke Patients Only)       Balance                                            Cognition Arousal/Alertness: Awake/alert Behavior During Therapy: WFL for tasks assessed/performed Overall Cognitive Status: Within Functional Limits for tasks assessed                                          Exercises General Exercises - Lower Extremity Ankle Circles/Pumps: AROM, Both, 20 reps, Supine Quad Sets: AROM, Both, 20 reps, Supine  Heel Slides: AROM, 10 reps, Supine, Both Straight Leg Raises: AROM, AAROM, Both, 5 reps, Supine    General Comments        Pertinent Vitals/Pain Pain Assessment Pain Assessment: Faces Faces Pain Scale: Hurts little more Pain Location: low back and L hip Pain Descriptors / Indicators: Sore Pain Intervention(s): Limited activity within patient's tolerance, Monitored during session, Repositioned    Home Living                          Prior Function            PT Goals (current goals can now be found in the care plan section) Acute Rehab PT Goals Patient Stated Goal: return home with family to assist PT Goal Formulation: With patient Time For Goal Achievement: 01/12/22 Potential to Achieve Goals: Good Progress towards PT goals:  Progressing toward goals    Frequency    Min 3X/week      PT Plan Current plan remains appropriate    Co-evaluation              AM-PAC PT "6 Clicks" Mobility   Outcome Measure  Help needed turning from your back to your side while in a flat bed without using bedrails?: A Little Help needed moving from lying on your back to sitting on the side of a flat bed without using bedrails?: A Lot Help needed moving to and from a bed to a chair (including a wheelchair)?: A Lot Help needed standing up from a chair using your arms (e.g., wheelchair or bedside chair)?: A Lot Help needed to walk in hospital room?: A Lot Help needed climbing 3-5 steps with a railing? : Total 6 Click Score: 12    End of Session Equipment Utilized During Treatment: Oxygen Activity Tolerance: Patient tolerated treatment well;Patient limited by fatigue Patient left: in bed;with call bell/phone within reach Nurse Communication: Mobility status PT Visit Diagnosis: Unsteadiness on feet (R26.81);Other abnormalities of gait and mobility (R26.89);Muscle weakness (generalized) (M62.81)     Time: 1141-1150 PT Time Calculation (min) (ACUTE ONLY): 9 min  Charges:  $Therapeutic Exercise: 8-22 mins                     12:26 PM, 01/06/22 Mearl Latin PT, DPT Physical Therapist at [facility]

## 2022-01-06 NOTE — Progress Notes (Signed)
Pharmacy Antibiotic Note  Veronica Horton is a 61 y.o. female admitted on 01/03/2022 with cellulitis.  Pharmacy has been consulted for Vancomycin dosing.  Plan: Vancomycin 1.25gm iv q24h  F/U cultures and clinical progress Monitor V/S, labs and levels as indicated  Height: 5' 5.5" (166.4 cm) Weight: 115.7 kg (255 lb) IBW/kg (Calculated) : 58.15  Temp (24hrs), Avg:98.3 F (36.8 C), Min:98 F (36.7 C), Max:99 F (37.2 C)  Recent Labs  Lab 01/03/22 1204 01/04/22 0325 01/05/22 0341 01/06/22 0334  WBC 15.2* 12.5* 8.1  --   CREATININE 2.58* 2.23* 2.05* 1.73*  LATICACIDVEN 1.8  --   --   --      Estimated Creatinine Clearance: 43.2 mL/min (A) (by C-G formula based on SCr of 1.73 mg/dL (H)).    Allergies  Allergen Reactions   Codeine Hives and Itching    Reports itching only per RN   Penicillins Hives    Did it involve swelling of the face/tongue/throat, SOB, or low BP? No Did it involve sudden or severe rash/hives, skin peeling, or any reaction on the inside of your mouth or nose? Yes Did you need to seek medical attention at a hospital or doctor's office? Unknown When did it last happen?      Over 10 years If all above answers are "NO", may proceed with cephalosporin use.     Antimicrobials this admission: Vancomycin 9/5 >>  Cefepime 9/5 x 1  Ceftriaxone 9/6 >>   Microbiology results: 9/5 BCx: NGTD 9/6 Ucx: 20k e coli 9/6 MRSA PCR: positive   Thank you for allowing pharmacy to be a part of this patient's care.  Thomasenia Sales, PharmD, [facility] Clinical Pharmacist

## 2022-01-06 NOTE — Progress Notes (Signed)
PROGRESS NOTE  Veronica Horton LHT:[id number] DOB: [birth year] DOA: 01/03/2022 PCP: [facility]  Brief History:  61 year old female with a history of COPD, tobacco abuse, chronic respiratory failure with tracheostomy on 6-8 L at home, laryngeal cancer status post tracheostomy, seizure disorder, GERD, hypertension, orthostatic hypotension presenting with generalized weakness and increasing left lower extremity redness and pain.  The patient visited the emergency department at [facility] on 12/27/2026 with lower extremity edema and pain.  The patient was discharged home from the ED with clindamycin with which she had been compliant.  She continued to have left lower extremity pain and edema.  She stated that she had her left leg against the wall approximately 1 week prior to this admission.  She denies any other recent injuries or falls.  She uses a walker at baseline.  She continues to smoke 1/2 pack/day.  She denies any fevers, chills, chest pain, coughing, hemoptysis, nausea, vomiting, diarrhea.  She states that she has chronic shortness of breath which is not any worse than usual. In the ED, the patient was afebrile hemodynamically stable with oxygen saturation 91-93% on 8 L.  WBC 15.2, hemoglobin 10.7, platelets 207,000.  Sodium 140, potassium 4.8, bicarbonate 24, serum creatinine 2.23.  The patient was started on IV vancomycin and IV fluids.  CT chest showed patchy airspace disease in LUL, LLL< RLL.  UA showed >50 WBC.  Urine culture showed E coli.  She was started on ceftriaxone to cover for UTI and aspiration pneumonia.     Assessment and Plan: * Lower extremity cellulitis - Patient not meeting criteria for sepsis currently. -Found with purulent cellulitis bilaterally (affecting left more than right). -Failed outpatient clindamycin -Continue vancomycin>>slowly improving -Wound care consult appreciated.  Aspiration pneumonitis (HCC) CT chest as discussed  above Continue ceftriaxone>>cefdinir Speech therapy eval  UTI (urinary tract infection) 9/5 UA >50 WBC Urine culture= Ecoli Initially started on ceftriaxone>>cefdinir  Acute renal failure superimposed on stage 3b chronic kidney disease (HCC) Baseline creatinine 1.6-1.8 Presented with serum creatinine 2.58 Secondary to volume depletion Patient was told to stop her furosemide by her PCP recently Continue judicious IV fluids>>saline lock Lasix IV x 1 as pt appears fluid overloaded now  DVT (deep venous thrombosis) ([place]) - Continue treatment with Eliquis -In the ED bilateral lower extremity ultrasound demonstrating no blood clots.  Chronic venous stasis dermatitis Appreciate wound care consult  Laryngeal cancer [facility]) - Patient has expressed not interested in further evaluation or treatment for the cancer -Palliative has seen her in the outpatient setting and planning to establish care more consistently -She is DNR.  Tracheostomy status ([place]) CHRONIC RESPIRATORY FAILURE WITH HYPOXIA - ON 6-8 L AT [place] in place; adequate saturation -stable  Tobacco abuse Tobacco cessation discussed  Class 3 obesity (HCC) -Lifestyle modification -Body mass index is 41.79 kg/m.   Hypertension -Stable overall -Holding home antihypertensive medications secondary to soft BP    Family Communication:   no Family at bedside   Consultants:  palliative   Code Status:  FULL    DVT Prophylaxis:  apixaban     Procedures: As Listed in Progress Note Above   Antibiotics: Vanc 9/5>> Ceftriaxone 9/6>>9/8        Subjective: Patient denies fevers, chills, headache, chest pain, dyspnea, nausea, vomiting, diarrhea, abdominal pain, dysuria, hematuria, hematochezia, and melena.   Objective: Vitals:   01/06/22 0727 01/06/22 1146 01/06/22 1328 01/06/22 1500  BP:   Marland Kitchen)  121/56   Pulse:  80 83 96  Resp:  '20 18 20  '$ Temp:   98.7 F (37.1 C)   TempSrc:   Oral   SpO2: 92% 91% 95% 92%   Weight:      Height:        Intake/Output Summary (Last 24 hours) at 01/06/2022 1807 Last data filed at 01/06/2022 1739 Gross per 24 hour  Intake 1024.3 ml  Output 1901 ml  Net -876.7 ml   Weight change:  Exam:  General:  Pt is alert, follows commands appropriately, not in acute distress HEENT: No icterus, No thrush, No neck mass, [place]/AT Cardiovascular: RRR, S1/S2, no rubs, no gallops Respiratory: scattered bilateral rales. No wheeze Abdomen: Soft/+BS, non tender, non distended, no guarding Extremities: 2 + LE edema, No lymphangitis, No petechiae, No rashes, no synovitis   Data Reviewed: I have personally reviewed following labs and imaging studies Basic Metabolic Panel: Recent Labs  Lab 01/03/22 1204 01/03/22 1239 01/04/22 0325 01/05/22 0341 01/06/22 0334  NA 136  --  140 138 137  K 5.9*  --  4.8 4.1 3.8  CL 105  --  109 110 107  CO2 22  --  '24 24 22  '$ GLUCOSE 93  --  92 73 92  BUN 39*  --  39* 37* 31*  CREATININE 2.58*  --  2.23* 2.05* 1.73*  CALCIUM 8.6*  --  8.5* 8.2* 8.0*  MG  --  2.6*  --   --  2.3   Liver Function Tests: Recent Labs  Lab 01/03/22 1204  AST 20  ALT 14  ALKPHOS 130*  BILITOT 1.3*  PROT 6.8  ALBUMIN 3.2*   No results for input(s): "LIPASE", "AMYLASE" in the last 168 hours. No results for input(s): "AMMONIA" in the last 168 hours. Coagulation Profile: No results for input(s): "INR", "PROTIME" in the last 168 hours. CBC: Recent Labs  Lab 01/03/22 1204 01/04/22 0325 01/05/22 0341  WBC 15.2* 12.5* 8.1  NEUTROABS 14.3*  --   --   HGB 10.7* 10.2* 9.2*  HCT 34.5* 33.5* 30.0*  MCV 93.5 95.4 94.9  PLT 211 207 173   Cardiac Enzymes: No results for input(s): "CKTOTAL", "CKMB", "CKMBINDEX", "TROPONINI" in the last 168 hours. BNP: Invalid input(s): "POCBNP" CBG: Recent Labs  Lab 01/03/22 1238  GLUCAP 103*   HbA1C: No results for input(s): "HGBA1C" in the last 72 hours. Urine analysis:    Component Value Date/Time   COLORURINE  YELLOW 01/03/2022 1820   APPEARANCEUR HAZY (A) 01/03/2022 1820   LABSPEC 1.012 01/03/2022 1820   PHURINE 5.0 01/03/2022 1820   GLUCOSEU NEGATIVE 01/03/2022 1820   HGBUR LARGE (A) 01/03/2022 1820   BILIRUBINUR NEGATIVE 01/03/2022 1820   KETONESUR NEGATIVE 01/03/2022 1820   PROTEINUR 30 (A) 01/03/2022 1820   UROBILINOGEN 0.2 09/25/2010 0530   NITRITE POSITIVE (A) 01/03/2022 1820   LEUKOCYTESUR LARGE (A) 01/03/2022 1820   Sepsis Labs: '@LABRCNTIP'$ (procalcitonin:4,lacticidven:4) ) Recent Results (from the past 240 hour(s))  Culture, blood (routine x 2)     Status: None (Preliminary result)   Collection Time: 01/03/22 12:04 PM   Specimen: BLOOD RIGHT HAND  Result Value Ref Range Status   Specimen Description BLOOD RIGHT HAND  Final   Special Requests   Final    BOTTLES DRAWN AEROBIC AND ANAEROBIC Blood Culture results may not be optimal due to an inadequate volume of blood received in culture bottles   Culture   Final    NO GROWTH 3 DAYS Performed  at [facility], [address] [place], [name] [address]    Report Status PENDING  Incomplete  Culture, blood (routine x 2)     Status: None (Preliminary result)   Collection Time: 01/03/22  1:45 PM   Specimen: BLOOD  Result Value Ref Range Status   Specimen Description BLOOD LEFT ANTECUBITAL  Final   Special Requests   Final    BOTTLES DRAWN AEROBIC AND ANAEROBIC Blood Culture results may not be optimal due to an inadequate volume of blood received in culture bottles   Culture   Final    NO GROWTH 3 DAYS Performed at [facility], [address], [place], [place] [address]    Report Status PENDING  Incomplete  MRSA Next Gen by PCR, Nasal     Status: Abnormal   Collection Time: 01/04/22  8:04 AM   Specimen: Nasal Mucosa; Nasal Swab  Result Value Ref Range Status   MRSA by PCR Next Gen DETECTED (A) NOT DETECTED Final    Comment: RESULT CALLED TO, READ BACK BY AND VERIFIED WITH: Elijah Birk RN [phone] K FORSYTH (NOTE) The  GeneXpert MRSA Assay (FDA approved for NASAL specimens only), is one component of a comprehensive MRSA colonization surveillance program. It is not intended to diagnose MRSA infection nor to guide or monitor treatment for MRSA infections. Test performance is not FDA approved in patients less than 79 years old. Performed at [facility], [address], [place], [place] [address]   Urine Culture     Status: Abnormal   Collection Time: 01/04/22 12:28 PM   Specimen: Urine, Clean Catch  Result Value Ref Range Status   Specimen Description   Final    URINE, CLEAN CATCH Performed at [facility], [address], [place], [place] [address]    Special Requests   Final    NONE Performed at [facility], [address] [place], [place] [address]    Culture 20,000 COLONIES/mL ESCHERICHIA COLI (A)  Final   Report Status 01/06/2022 FINAL  Final   Organism ID, Bacteria ESCHERICHIA COLI (A)  Final      Susceptibility   Escherichia coli - MIC*    AMPICILLIN >=32 RESISTANT Resistant     CEFAZOLIN <=4 SENSITIVE Sensitive     CEFEPIME <=0.12 SENSITIVE Sensitive     CEFTRIAXONE <=0.25 SENSITIVE Sensitive     CIPROFLOXACIN <=0.25 SENSITIVE Sensitive     GENTAMICIN <=1 SENSITIVE Sensitive     IMIPENEM <=0.25 SENSITIVE Sensitive     NITROFURANTOIN <=16 SENSITIVE Sensitive     TRIMETH/SULFA <=20 SENSITIVE Sensitive     AMPICILLIN/SULBACTAM 16 INTERMEDIATE Intermediate     PIP/TAZO <=4 SENSITIVE Sensitive     * 20,000 COLONIES/mL ESCHERICHIA COLI     Scheduled Meds:  apixaban  5 mg Oral BID   budesonide  0.5 mg Nebulization BID   [START ON 01/07/2022] cefdinir  300 mg Oral Q12H   citalopram  20 mg Oral Daily   famotidine  20 mg Per Tube Daily   folic acid  1 mg Per Tube Daily   leptospermum manuka honey  1 Application Topical Daily   levETIRAcetam  750 mg Per Tube BID   levothyroxine  150 mcg Oral Daily   midodrine  5 mg Per Tube TID WC   polyethylene glycol  17 g Oral Daily   cyanocobalamin   500 mcg Per Tube Daily   Continuous Infusions:  vancomycin 166.7 mL/hr at 01/06/22 1739    Procedures/Studies: CT CHEST WO CONTRAST  Result Date: 01/04/2022 CLINICAL DATA:  A 61 year old female with chronic dyspnea presents for evaluation in the setting of abnormal chest radiograph. EXAM: CT CHEST WITHOUT CONTRAST TECHNIQUE: Multidetector CT imaging of the chest was performed following the standard protocol without IV contrast. RADIATION DOSE REDUCTION: This exam was performed according to the departmental dose-optimization program which includes automated exposure control, adjustment of the mA and/or kV according to patient size and/or use of iterative reconstruction technique. COMPARISON:  December of 2021 FINDINGS: Cardiovascular: Scattered aortic atherosclerosis. Three-vessel coronary artery disease. Normal heart size without pericardial effusion or nodularity. Aberrant RIGHT subclavian artery. Mild engorgement of central pulmonary vasculature. Limited assessment of cardiovascular structures given lack of intravenous contrast. Mediastinum/Nodes: Tracheostomy tube terminating in the proximal trachea. No signs of adenopathy in the chest. No acute process in the mediastinum. Esophagus is grossly normal. Lungs/Pleura: Patchy airspace disease in the chest in LEFT upper lobe, lingula, LEFT lower lobe and RIGHT lung base. Volume loss in the LEFT lung base associated also with consolidative changes and material in bronchial structures. No pneumothorax. Trace pleural effusions. Airways are otherwise patent. Upper Abdomen: Incidental imaging of upper abdominal contents without acute process to the extent evaluated. Musculoskeletal: Spinal degenerative changes. No acute or destructive bone process. Rib fractures of varying ages, no acute fractures most are chronic or subacute and without displacement. Fracture at the T9 level with 20-30% loss of height. Vacuum phenomenon within the fracture site. No surrounding  stranding. T11 with loss of height as well. These have occurred since December of 2021. No listhesis. IMPRESSION: 1. Multifocal pneumonia with acute on chronic sequela of aspiration favored at the LEFT lung base. 2. Age-indeterminate compression fractures potentially subacute/recent at the T9 level and T11 level are new since 2021. Correlate with any history of prior fracture to these areas and with any current symptoms. 3. Aortic atherosclerosis and coronary artery disease. Aortic Atherosclerosis (ICD10-I70.0). Electronically Signed   By: Zetta Bills M.D.   On: 01/04/2022 17:23   DG Chest Portable 1 View  Result Date: 01/03/2022 CLINICAL DATA:  Provided history: Shortness of breath. EXAM: PORTABLE CHEST 1 VIEW COMPARISON:  Prior chest radiographs 08/08/2021 and earlier. FINDINGS: A tracheostomy tube is noted. Cardiomegaly. Central pulmonary vascular congestion. Prominence of the interstitial lung markings bilaterally, compatible with pulmonary interstitial edema. Irregular and nodular opacities within the right lung base. The nodular opacities measure up to 16 mm. Irregular opacities within the left lung base. No evidence of pneumothorax. No acute bony abnormality identified. IMPRESSION: Cardiomegaly with pulmonary interstitial edema. Opacities within bilateral lung bases, which may reflect atelectasis and/or pneumonia. Superimposed more nodular opacities within the right lung base. A chest CT is recommended to better assess for possible pulmonary nodules at this site. Small left pleural effusion, which may be partially loculated. Electronically Signed   By: Kellie Simmering D.O.   On: 01/03/2022 14:50   US Venous Img Lower Bilateral  Result Date: 01/03/2022 CLINICAL DATA:  Bilateral open wounds. EXAM: BILATERAL LOWER EXTREMITY VENOUS DOPPLER ULTRASOUND TECHNIQUE: Gray-scale sonography with graded compression, as well as color Doppler and duplex ultrasound were performed to evaluate the lower extremity deep  venous systems from the level of the common femoral vein and including the common femoral, femoral, profunda femoral, popliteal and calf veins including the posterior tibial, peroneal and gastrocnemius veins when visible. The superficial great saphenous vein was also interrogated. Spectral Doppler was utilized to evaluate flow at rest and with distal augmentation maneuvers in the common femoral, femoral and popliteal veins.  COMPARISON:  None Available. FINDINGS: RIGHT LOWER EXTREMITY Common Femoral Vein: No evidence of thrombus. Normal compressibility, respiratory phasicity and response to augmentation. Saphenofemoral Junction: No evidence of thrombus. Normal compressibility and flow on color Doppler imaging. Profunda Femoral Vein: No evidence of thrombus. Normal compressibility and flow on color Doppler imaging. Femoral Vein: No evidence of thrombus. Normal compressibility, respiratory phasicity and response to augmentation. Popliteal Vein: No evidence of thrombus. Normal compressibility, respiratory phasicity and response to augmentation. Calf Veins: Limited evaluation. Superficial Great Saphenous Vein: Limited evaluation. Other Findings:  Large amount of subcutaneous edema. LEFT LOWER EXTREMITY Common Femoral Vein: No evidence of thrombus. Normal compressibility, respiratory phasicity and response to augmentation. Saphenofemoral Junction: No evidence of thrombus. Normal compressibility and flow on color Doppler imaging. Profunda Femoral Vein: No evidence of thrombus. Normal compressibility and flow on color Doppler imaging. Femoral Vein: No evidence of thrombus. Normal compressibility, respiratory phasicity and response to augmentation. Popliteal Vein: No evidence of thrombus. Normal compressibility, respiratory phasicity and response to augmentation. Calf Veins: Limited evaluation. Superficial Great Saphenous Vein: No evidence of thrombus. Normal compressibility. Other Findings:  Subcutaneous edema. IMPRESSION:  No evidence of deep venous thrombosis in either lower extremity. However, the study has technical limitations due to body habitus and edema. Limited evaluation the bilateral deep calf veins. Electronically Signed   By: Markus Daft M.D.   On: 01/03/2022 13:45    Orson Eva, DO  [facility]  If 7PM-7AM, please contact night-coverage [email or web address] Password [email or web address] 01/06/2022, 6:07 PM   LOS: 3 days

## 2022-01-06 NOTE — Assessment & Plan Note (Signed)
9/5 UA >50 WBC Urine culture= Ecoli Initially started on ceftriaxone>>cefdinir>>home with 3 more days

## 2022-01-07 ENCOUNTER — Inpatient Hospital Stay (HOSPITAL_COMMUNITY): Payer: Medicaid Other

## 2022-01-07 DIAGNOSIS — J690 Pneumonitis due to inhalation of food and vomit: Secondary | ICD-10-CM | POA: Diagnosis not present

## 2022-01-07 DIAGNOSIS — N179 Acute kidney failure, unspecified: Secondary | ICD-10-CM | POA: Diagnosis not present

## 2022-01-07 DIAGNOSIS — I5031 Acute diastolic (congestive) heart failure: Secondary | ICD-10-CM | POA: Diagnosis not present

## 2022-01-07 DIAGNOSIS — E876 Hypokalemia: Secondary | ICD-10-CM

## 2022-01-07 DIAGNOSIS — L03116 Cellulitis of left lower limb: Secondary | ICD-10-CM | POA: Diagnosis not present

## 2022-01-07 LAB — BRAIN NATRIURETIC PEPTIDE: B Natriuretic Peptide: 127 pg/mL — ABNORMAL HIGH (ref 0.0–100.0)

## 2022-01-07 LAB — ECHOCARDIOGRAM COMPLETE
AR max vel: 2.06 cm2
AV Area VTI: 2.33 cm2
AV Area mean vel: 2.15 cm2
AV Mean grad: 4 mmHg
AV Peak grad: 9.3 mmHg
Ao pk vel: 1.52 m/s
Height: 65.5 in
S' Lateral: 2.5 cm
Weight: 4080 oz

## 2022-01-07 LAB — BASIC METABOLIC PANEL
Anion gap: 7 (ref 5–15)
BUN: 28 mg/dL — ABNORMAL HIGH (ref 8–23)
CO2: 25 mmol/L (ref 22–32)
Calcium: 8.4 mg/dL — ABNORMAL LOW (ref 8.9–10.3)
Chloride: 107 mmol/L (ref 98–111)
Creatinine, Ser: 1.69 mg/dL — ABNORMAL HIGH (ref 0.44–1.00)
GFR, Estimated: 34 mL/min — ABNORMAL LOW (ref >60)
Glucose, Bld: 78 mg/dL (ref 70–99)
Potassium: 3.4 mmol/L — ABNORMAL LOW (ref 3.5–5.1)
Sodium: 139 mmol/L (ref 135–145)

## 2022-01-07 MED ORDER — OXYCODONE HCL 5 MG PO TABS
5.0000 mg | ORAL_TABLET | Freq: Once | ORAL | Status: AC
  Administered 2022-01-07: 5 mg via ORAL
  Filled 2022-01-07: qty 1

## 2022-01-07 MED ORDER — POTASSIUM CHLORIDE CRYS ER 20 MEQ PO TBCR
20.0000 meq | EXTENDED_RELEASE_TABLET | Freq: Once | ORAL | Status: AC
Start: 2022-01-07 — End: 2022-01-07
  Administered 2022-01-07: 20 meq via ORAL
  Filled 2022-01-07: qty 1

## 2022-01-07 MED ORDER — PERFLUTREN LIPID MICROSPHERE
1.0000 mL | INTRAVENOUS | Status: AC | PRN
  Administered 2022-01-07: 4 mL via INTRAVENOUS

## 2022-01-07 MED ORDER — FUROSEMIDE 10 MG/ML IJ SOLN
40.0000 mg | Freq: Every day | INTRAMUSCULAR | Status: DC
  Administered 2022-01-07 – 2022-01-08 (×2): 40 mg via INTRAVENOUS
  Filled 2022-01-07 (×3): qty 4

## 2022-01-07 NOTE — Progress Notes (Signed)
Echocardiogram 2D Echocardiogram has been performed.  Oneal Deputy Khalon Cansler RDCS 01/07/2022, 1:56 PM

## 2022-01-07 NOTE — Assessment & Plan Note (Signed)
Clinically fluid overloaded>> improved at the time of discharge Started IV lasix 9/8--continue>> discharged home with furosemide 40 mg twice daily po --9/10 echo--EF 65 to 70%, no WMA. Normal RV; trivial TR

## 2022-01-07 NOTE — Progress Notes (Addendum)
PROGRESS NOTE  Veronica Horton IHK:[id number] DOB: 08-Jul-1960 DOA: 01/03/2022 PCP: [facility]  Brief History:  61 year old female with a history of COPD, tobacco abuse, chronic respiratory failure with tracheostomy on 6-8 L at home, laryngeal cancer status post tracheostomy, seizure disorder, GERD, hypertension, orthostatic hypotension presenting with generalized weakness and increasing left lower extremity redness and pain.  The patient visited the emergency department at [facility] on 12/27/2026 with lower extremity edema and pain.  The patient was discharged home from the ED with clindamycin with which she had been compliant.  She continued to have left lower extremity pain and edema.  She stated that she had her left leg against the wall approximately 1 week prior to this admission.  She denies any other recent injuries or falls.  She uses a walker at baseline.  She continues to smoke 1/2 pack/day.  She denies any fevers, chills, chest pain, coughing, hemoptysis, nausea, vomiting, diarrhea.  She states that she has chronic shortness of breath which is not any worse than usual. In the ED, the patient was afebrile hemodynamically stable with oxygen saturation 91-93% on 8 L.  WBC 15.2, hemoglobin 10.7, platelets 207,000.  Sodium 140, potassium 4.8, bicarbonate 24, serum creatinine 2.23.  The patient was started on IV vancomycin and IV fluids.  CT chest showed patchy airspace disease in LUL, LLL< RLL.  UA showed >50 WBC.  Urine culture showed E coli.  She was started on ceftriaxone to cover for UTI and aspiration pneumonia.    Assessment and Plan: * Lower extremity cellulitis - Patient not meeting criteria for sepsis currently. -Found with purulent cellulitis bilaterally (affecting left more than right). -Failed outpatient clindamycin -Continue vancomycin>>slowly improving -Wound care consult appreciated.  Aspiration pneumonitis (HCC) CT chest as discussed  above Continue ceftriaxone>>cefdinir Speech therapy eval>>dys 3 with thin  Hypokalemia replete  Acute diastolic CHF (congestive heart failure) (HCC) Clinically fluid overloaded Started IV lasix 9/8--continue Echo  UTI (urinary tract infection) 9/5 UA >50 WBC Urine culture= Ecoli Initially started on ceftriaxone>>cefdinir  Acute renal failure superimposed on stage 3b chronic kidney disease (HCC) Baseline creatinine 1.6-1.8 Presented with serum creatinine 2.58 Secondary to volume depletion Patient was told to stop her furosemide by her PCP recently Continue judicious IV fluids>>saline lock Now fluid overloaded>>start IV lasix  DVT (deep venous thrombosis) ([place]) - Continue treatment with Eliquis -In the ED bilateral lower extremity ultrasound demonstrating no blood clots.  Chronic venous stasis dermatitis Appreciate wound care consult ----Care to the LEs will be to wash with soap and water daily, rinse and dry, then dress left LE wounds with MediHoney (antimicrobial gel) topped with silicone foam. Mild compression to both legs will be with a dry boot (ie., Kerlix wrapped from just below toes to just below knees, then topped with 6-inch ACE bandages). Floatation of heels by placing the feet into Prevalon boots is requested.   Laryngeal cancer [facility]) - Patient has expressed not interested in further evaluation or treatment for the cancer -Palliative has seen her in the outpatient setting and planning to establish care more consistently -She is DNR.  Tracheostomy status ([place]) CHRONIC RESPIRATORY FAILURE WITH HYPOXIA - ON 6-8 L AT [place] in place; adequate saturation -stable  Tobacco abuse Tobacco cessation discussed  Class 3 obesity (HCC) -Lifestyle modification -Body mass index is 41.79 kg/m.   Hypertension -Stable overall -Holding home antihypertensive medications secondary to soft BP   Family Communication:  no Family at bedside   Consultants:   palliative   Code Status:  FULL    DVT Prophylaxis:  apixaban     Procedures: As Listed in Progress Note Above   Antibiotics: Vanc 9/5>> Ceftriaxone 9/6>>9/8 Cefdinir 9/9>>            Subjective: She is breathing better and feeling better since getting IV lasix. Patient denies fevers, chills, headache, chest pain, dyspnea, nausea, vomiting, diarrhea, abdominal pain, dysuria, hematuria, hematochezia, and melena.   Objective: Vitals:   01/06/22 2202 01/07/22 0451 01/07/22 0742 01/07/22 1130  BP: 125/74 (!) 99/48    Pulse: 94 70    Resp: 18 16    Temp: 99.2 F (37.3 C) 98.3 F (36.8 C)    TempSrc: Oral     SpO2: 98% 96% 100% 95%  Weight:      Height:        Intake/Output Summary (Last 24 hours) at 01/07/2022 1242 Last data filed at 01/07/2022 0900 Gross per 24 hour  Intake 1169.95 ml  Output 1200 ml  Net -30.05 ml   Weight change:  Exam:  General:  Pt is alert, follows commands appropriately, not in acute distress HEENT: No icterus, No thrush, No neck mass, [place]/AT Cardiovascular: RRR, S1/S2, no rubs, no gallops Respiratory: fine bibasilar crackles, no wheeze Abdomen: Soft/+BS, non tender, non distended, no guarding Extremities: 2 + LE edema, No lymphangitis, No petechiae, No rashes, no synovitis   Data Reviewed: I have personally reviewed following labs and imaging studies Basic Metabolic Panel: Recent Labs  Lab 01/03/22 1204 01/03/22 1239 01/04/22 0325 01/05/22 0341 01/06/22 0334 01/07/22 0437  NA 136  --  140 138 137 139  K 5.9*  --  4.8 4.1 3.8 3.4*  CL 105  --  109 110 107 107  CO2 22  --  '24 24 22 25  '$ GLUCOSE 93  --  92 73 92 78  BUN 39*  --  39* 37* 31* 28*  CREATININE 2.58*  --  2.23* 2.05* 1.73* 1.69*  CALCIUM 8.6*  --  8.5* 8.2* 8.0* 8.4*  MG  --  2.6*  --   --  2.3  --    Liver Function Tests: Recent Labs  Lab 01/03/22 1204  AST 20  ALT 14  ALKPHOS 130*  BILITOT 1.3*  PROT 6.8  ALBUMIN 3.2*   No results for input(s):  "LIPASE", "AMYLASE" in the last 168 hours. No results for input(s): "AMMONIA" in the last 168 hours. Coagulation Profile: No results for input(s): "INR", "PROTIME" in the last 168 hours. CBC: Recent Labs  Lab 01/03/22 1204 01/04/22 0325 01/05/22 0341  WBC 15.2* 12.5* 8.1  NEUTROABS 14.3*  --   --   HGB 10.7* 10.2* 9.2*  HCT 34.5* 33.5* 30.0*  MCV 93.5 95.4 94.9  PLT 211 207 173   Cardiac Enzymes: No results for input(s): "CKTOTAL", "CKMB", "CKMBINDEX", "TROPONINI" in the last 168 hours. BNP: Invalid input(s): "POCBNP" CBG: Recent Labs  Lab 01/03/22 1238  GLUCAP 103*   HbA1C: No results for input(s): "HGBA1C" in the last 72 hours. Urine analysis:    Component Value Date/Time   COLORURINE YELLOW 01/03/2022 1820   APPEARANCEUR HAZY (A) 01/03/2022 1820   LABSPEC 1.012 01/03/2022 1820   PHURINE 5.0 01/03/2022 1820   GLUCOSEU NEGATIVE 01/03/2022 1820   HGBUR LARGE (A) 01/03/2022 1820   BILIRUBINUR NEGATIVE 01/03/2022 1820   KETONESUR NEGATIVE 01/03/2022 1820   PROTEINUR 30 (A) 01/03/2022 1820   UROBILINOGEN 0.2  09/25/2010 0530   NITRITE POSITIVE (A) 01/03/2022 1820   LEUKOCYTESUR LARGE (A) 01/03/2022 1820   Sepsis Labs: '@LABRCNTIP'$ (procalcitonin:4,lacticidven:4) ) Recent Results (from the past 240 hour(s))  Culture, blood (routine x 2)     Status: None (Preliminary result)   Collection Time: 01/03/22 12:04 PM   Specimen: BLOOD RIGHT HAND  Result Value Ref Range Status   Specimen Description BLOOD RIGHT HAND  Final   Special Requests   Final    BOTTLES DRAWN AEROBIC AND ANAEROBIC Blood Culture results may not be optimal due to an inadequate volume of blood received in culture bottles   Culture   Final    NO GROWTH 4 DAYS Performed at [facility], [address], [place], [place] [address]    Report Status PENDING  Incomplete  Culture, blood (routine x 2)     Status: None (Preliminary result)   Collection Time: 01/03/22  1:45 PM   Specimen: BLOOD  Result Value  Ref Range Status   Specimen Description BLOOD LEFT ANTECUBITAL  Final   Special Requests   Final    BOTTLES DRAWN AEROBIC AND ANAEROBIC Blood Culture results may not be optimal due to an inadequate volume of blood received in culture bottles   Culture   Final    NO GROWTH 4 DAYS Performed at [facility], [address] [place], [place] [address]    Report Status PENDING  Incomplete  MRSA Next Gen by PCR, Nasal     Status: Abnormal   Collection Time: 01/04/22  8:04 AM   Specimen: Nasal Mucosa; Nasal Swab  Result Value Ref Range Status   MRSA by PCR Next Gen DETECTED (A) NOT DETECTED Final    Comment: RESULT CALLED TO, READ BACK BY AND VERIFIED WITH: Elijah Birk RN [phone] K FORSYTH (NOTE) The GeneXpert MRSA Assay (FDA approved for NASAL specimens only), is one component of a comprehensive MRSA colonization surveillance program. It is not intended to diagnose MRSA infection nor to guide or monitor treatment for MRSA infections. Test performance is not FDA approved in patients less than 62 years old. Performed at [facility], [address] [place], [place] [address]   Urine Culture     Status: Abnormal   Collection Time: 01/04/22 12:28 PM   Specimen: Urine, Clean Catch  Result Value Ref Range Status   Specimen Description   Final    URINE, CLEAN CATCH Performed at [facility], [address] [place], [place] [address]    Special Requests   Final    NONE Performed at [facility], [address], [place], [place] [address]    Culture 20,000 COLONIES/mL ESCHERICHIA COLI (A)  Final   Report Status 01/06/2022 FINAL  Final   Organism ID, Bacteria ESCHERICHIA COLI (A)  Final      Susceptibility   Escherichia coli - MIC*    AMPICILLIN >=32 RESISTANT Resistant     CEFAZOLIN <=4 SENSITIVE Sensitive     CEFEPIME <=0.12 SENSITIVE Sensitive     CEFTRIAXONE <=0.25 SENSITIVE Sensitive     CIPROFLOXACIN <=0.25 SENSITIVE Sensitive     GENTAMICIN <=1 SENSITIVE Sensitive     IMIPENEM  <=0.25 SENSITIVE Sensitive     NITROFURANTOIN <=16 SENSITIVE Sensitive     TRIMETH/SULFA <=20 SENSITIVE Sensitive     AMPICILLIN/SULBACTAM 16 INTERMEDIATE Intermediate     PIP/TAZO <=4 SENSITIVE Sensitive     * 20,000 COLONIES/mL ESCHERICHIA COLI     Scheduled Meds:  apixaban  5 mg Oral BID  budesonide  0.5 mg Nebulization BID   cefdinir  300 mg Oral Q12H   citalopram  20 mg Oral Daily   famotidine  20 mg Per Tube Daily   folic acid  1 mg Per Tube Daily   furosemide  40 mg Intravenous Daily   leptospermum manuka honey  1 Application Topical Daily   levETIRAcetam  750 mg Per Tube BID   levothyroxine  150 mcg Oral Daily   midodrine  5 mg Per Tube TID WC   polyethylene glycol  17 g Oral Daily   cyanocobalamin  500 mcg Per Tube Daily   Continuous Infusions:  vancomycin 166.7 mL/hr at 01/06/22 1739    Procedures/Studies: CT CHEST WO CONTRAST  Result Date: 01/04/2022 CLINICAL DATA:  A 61 year old female with chronic dyspnea presents for evaluation in the setting of abnormal chest radiograph. EXAM: CT CHEST WITHOUT CONTRAST TECHNIQUE: Multidetector CT imaging of the chest was performed following the standard protocol without IV contrast. RADIATION DOSE REDUCTION: This exam was performed according to the departmental dose-optimization program which includes automated exposure control, adjustment of the mA and/or kV according to patient size and/or use of iterative reconstruction technique. COMPARISON:  December of 2021 FINDINGS: Cardiovascular: Scattered aortic atherosclerosis. Three-vessel coronary artery disease. Normal heart size without pericardial effusion or nodularity. Aberrant RIGHT subclavian artery. Mild engorgement of central pulmonary vasculature. Limited assessment of cardiovascular structures given lack of intravenous contrast. Mediastinum/Nodes: Tracheostomy tube terminating in the proximal trachea. No signs of adenopathy in the chest. No acute process in the mediastinum.  Esophagus is grossly normal. Lungs/Pleura: Patchy airspace disease in the chest in LEFT upper lobe, lingula, LEFT lower lobe and RIGHT lung base. Volume loss in the LEFT lung base associated also with consolidative changes and material in bronchial structures. No pneumothorax. Trace pleural effusions. Airways are otherwise patent. Upper Abdomen: Incidental imaging of upper abdominal contents without acute process to the extent evaluated. Musculoskeletal: Spinal degenerative changes. No acute or destructive bone process. Rib fractures of varying ages, no acute fractures most are chronic or subacute and without displacement. Fracture at the T9 level with 20-30% loss of height. Vacuum phenomenon within the fracture site. No surrounding stranding. T11 with loss of height as well. These have occurred since December of 2021. No listhesis. IMPRESSION: 1. Multifocal pneumonia with acute on chronic sequela of aspiration favored at the LEFT lung base. 2. Age-indeterminate compression fractures potentially subacute/recent at the T9 level and T11 level are new since 2021. Correlate with any history of prior fracture to these areas and with any current symptoms. 3. Aortic atherosclerosis and coronary artery disease. Aortic Atherosclerosis (ICD10-I70.0). Electronically Signed   By: Zetta Bills M.D.   On: 01/04/2022 17:23   DG Chest Portable 1 View  Result Date: 01/03/2022 CLINICAL DATA:  Provided history: Shortness of breath. EXAM: PORTABLE CHEST 1 VIEW COMPARISON:  Prior chest radiographs 08/08/2021 and earlier. FINDINGS: A tracheostomy tube is noted. Cardiomegaly. Central pulmonary vascular congestion. Prominence of the interstitial lung markings bilaterally, compatible with pulmonary interstitial edema. Irregular and nodular opacities within the right lung base. The nodular opacities measure up to 16 mm. Irregular opacities within the left lung base. No evidence of pneumothorax. No acute bony abnormality identified.  IMPRESSION: Cardiomegaly with pulmonary interstitial edema. Opacities within bilateral lung bases, which may reflect atelectasis and/or pneumonia. Superimposed more nodular opacities within the right lung base. A chest CT is recommended to better assess for possible pulmonary nodules at this site. Small left pleural effusion, which may be  partially loculated. Electronically Signed   By: Kellie Simmering D.O.   On: 01/03/2022 14:50   US Venous Img Lower Bilateral  Result Date: 01/03/2022 CLINICAL DATA:  Bilateral open wounds. EXAM: BILATERAL LOWER EXTREMITY VENOUS DOPPLER ULTRASOUND TECHNIQUE: Gray-scale sonography with graded compression, as well as color Doppler and duplex ultrasound were performed to evaluate the lower extremity deep venous systems from the level of the common femoral vein and including the common femoral, femoral, profunda femoral, popliteal and calf veins including the posterior tibial, peroneal and gastrocnemius veins when visible. The superficial great saphenous vein was also interrogated. Spectral Doppler was utilized to evaluate flow at rest and with distal augmentation maneuvers in the common femoral, femoral and popliteal veins. COMPARISON:  None Available. FINDINGS: RIGHT LOWER EXTREMITY Common Femoral Vein: No evidence of thrombus. Normal compressibility, respiratory phasicity and response to augmentation. Saphenofemoral Junction: No evidence of thrombus. Normal compressibility and flow on color Doppler imaging. Profunda Femoral Vein: No evidence of thrombus. Normal compressibility and flow on color Doppler imaging. Femoral Vein: No evidence of thrombus. Normal compressibility, respiratory phasicity and response to augmentation. Popliteal Vein: No evidence of thrombus. Normal compressibility, respiratory phasicity and response to augmentation. Calf Veins: Limited evaluation. Superficial Great Saphenous Vein: Limited evaluation. Other Findings:  Large amount of subcutaneous edema. LEFT  LOWER EXTREMITY Common Femoral Vein: No evidence of thrombus. Normal compressibility, respiratory phasicity and response to augmentation. Saphenofemoral Junction: No evidence of thrombus. Normal compressibility and flow on color Doppler imaging. Profunda Femoral Vein: No evidence of thrombus. Normal compressibility and flow on color Doppler imaging. Femoral Vein: No evidence of thrombus. Normal compressibility, respiratory phasicity and response to augmentation. Popliteal Vein: No evidence of thrombus. Normal compressibility, respiratory phasicity and response to augmentation. Calf Veins: Limited evaluation. Superficial Great Saphenous Vein: No evidence of thrombus. Normal compressibility. Other Findings:  Subcutaneous edema. IMPRESSION: No evidence of deep venous thrombosis in either lower extremity. However, the study has technical limitations due to body habitus and edema. Limited evaluation the bilateral deep calf veins. Electronically Signed   By: Markus Daft M.D.   On: 01/03/2022 13:45    Orson Eva, DO  [facility]  If 7PM-7AM, please contact night-coverage [email or web address] Password [email or web address] 01/07/2022, 12:42 PM   LOS: 4 days

## 2022-01-07 NOTE — Assessment & Plan Note (Signed)
-  replete °

## 2022-01-08 DIAGNOSIS — I5031 Acute diastolic (congestive) heart failure: Secondary | ICD-10-CM | POA: Diagnosis not present

## 2022-01-08 DIAGNOSIS — N179 Acute kidney failure, unspecified: Secondary | ICD-10-CM | POA: Diagnosis not present

## 2022-01-08 DIAGNOSIS — L03115 Cellulitis of right lower limb: Secondary | ICD-10-CM | POA: Diagnosis not present

## 2022-01-08 DIAGNOSIS — L03116 Cellulitis of left lower limb: Secondary | ICD-10-CM | POA: Diagnosis not present

## 2022-01-08 LAB — BASIC METABOLIC PANEL
Anion gap: 7 (ref 5–15)
BUN: 20 mg/dL (ref 8–23)
CO2: 26 mmol/L (ref 22–32)
Calcium: 8 mg/dL — ABNORMAL LOW (ref 8.9–10.3)
Chloride: 106 mmol/L (ref 98–111)
Creatinine, Ser: 1.44 mg/dL — ABNORMAL HIGH (ref 0.44–1.00)
GFR, Estimated: 41 mL/min — ABNORMAL LOW (ref >60)
Glucose, Bld: 80 mg/dL (ref 70–99)
Potassium: 3.1 mmol/L — ABNORMAL LOW (ref 3.5–5.1)
Sodium: 139 mmol/L (ref 135–145)

## 2022-01-08 LAB — CULTURE, BLOOD (ROUTINE X 2)
Culture: NO GROWTH
Culture: NO GROWTH

## 2022-01-08 LAB — BRAIN NATRIURETIC PEPTIDE: B Natriuretic Peptide: 184 pg/mL — ABNORMAL HIGH (ref 0.0–100.0)

## 2022-01-08 LAB — MAGNESIUM: Magnesium: 1.8 mg/dL (ref 1.7–2.4)

## 2022-01-08 MED ORDER — DOXYCYCLINE HYCLATE 100 MG PO TABS
100.0000 mg | ORAL_TABLET | Freq: Two times a day (BID) | ORAL | Status: DC
  Filled 2022-01-08: qty 1

## 2022-01-08 MED ORDER — OXYCODONE HCL 5 MG PO TABS
5.0000 mg | ORAL_TABLET | Freq: Once | ORAL | Status: AC
  Administered 2022-01-08: 5 mg via ORAL
  Filled 2022-01-08: qty 1

## 2022-01-08 MED ORDER — CLINDAMYCIN HCL 150 MG PO CAPS
300.0000 mg | ORAL_CAPSULE | Freq: Four times a day (QID) | ORAL | Status: DC
  Administered 2022-01-08 – 2022-01-09 (×5): 300 mg via ORAL
  Filled 2022-01-08 (×6): qty 2

## 2022-01-08 MED ORDER — FUROSEMIDE 10 MG/ML IJ SOLN
40.0000 mg | Freq: Once | INTRAMUSCULAR | Status: AC
  Administered 2022-01-08: 40 mg via INTRAVENOUS
  Filled 2022-01-08: qty 4

## 2022-01-08 MED ORDER — HYDROCODONE-ACETAMINOPHEN 5-325 MG PO TABS
0.5000 | ORAL_TABLET | ORAL | Status: DC | PRN
  Administered 2022-01-08 – 2022-01-09 (×3): 0.5 via ORAL
  Filled 2022-01-08 (×3): qty 1

## 2022-01-08 MED ORDER — POTASSIUM CHLORIDE CRYS ER 20 MEQ PO TBCR
40.0000 meq | EXTENDED_RELEASE_TABLET | Freq: Once | ORAL | Status: AC
  Administered 2022-01-08: 40 meq via ORAL
  Filled 2022-01-08: qty 2

## 2022-01-08 MED ORDER — FUROSEMIDE 40 MG PO TABS
40.0000 mg | ORAL_TABLET | Freq: Two times a day (BID) | ORAL | Status: DC
  Administered 2022-01-09 (×2): 40 mg via ORAL
  Filled 2022-01-08 (×2): qty 1

## 2022-01-08 NOTE — Progress Notes (Signed)
PROGRESS NOTE  Veronica Horton:[id number] DOB: July 04, 1960 DOA: 01/03/2022 PCP: [facility]  Brief History:  61 year old female with a history of COPD, tobacco abuse, chronic respiratory failure with tracheostomy on 6-8 L at home, laryngeal cancer status post tracheostomy, seizure disorder, GERD, hypertension, orthostatic hypotension presenting with generalized weakness and increasing left lower extremity redness and pain.  The patient visited the emergency department at [facility] on 12/27/2026 with lower extremity edema and pain.  The patient was discharged home from the ED with clindamycin with which she had been compliant.  She continued to have left lower extremity pain and edema.  She stated that she had her left leg against the wall approximately 1 week prior to this admission.  She denies any other recent injuries or falls.  She uses a walker at baseline.  She continues to smoke 1/2 pack/day.  She denies any fevers, chills, chest pain, coughing, hemoptysis, nausea, vomiting, diarrhea.  She states that she has chronic shortness of breath which is not any worse than usual. In the ED, the patient was afebrile hemodynamically stable with oxygen saturation 91-93% on 8 L.  WBC 15.2, hemoglobin 10.7, platelets 207,000.  Sodium 140, potassium 4.8, bicarbonate 24, serum creatinine 2.23.  The patient was started on IV vancomycin and IV fluids.  CT chest showed patchy airspace disease in LUL, LLL< RLL.  UA showed >50 WBC.  Urine culture showed E coli.  She was started on ceftriaxone to cover for UTI and aspiration pneumonia.  The patient's cellulitis and pneumonia were clinically improving.  Fortunately, the patient's hospitalization has been prolonged secondary to development of fluid overload and acute diastolic CHF for which the patient was started on IV furosemide with clinical improvement.     Assessment and Plan: * Lower extremity cellulitis - Patient not meeting  criteria for sepsis currently. -Found with purulent cellulitis bilaterally (affecting left more than right). -Failed outpatient clindamycin -Continue vancomycin>>slowly improving>>doxy -Wound care consult appreciated.  Aspiration pneumonitis (HCC) CT chest as discussed above Continue ceftriaxone>>cefdinir Speech therapy eval>>dys 3 with thin  Hypokalemia replete  Acute diastolic CHF (congestive heart failure) (HCC) Clinically fluid overloaded Started IV lasix 9/8--continue>> Echo  UTI (urinary tract infection) 9/5 UA >50 WBC Urine culture= Ecoli Initially started on ceftriaxone>>cefdinir  Acute renal failure superimposed on stage 3b chronic kidney disease (HCC) Baseline creatinine 1.6-1.8 Presented with serum creatinine 2.58 Secondary to volume depletion Patient was told to stop her furosemide by her PCP recently Continue judicious IV fluids>>saline lock Now fluid overloaded>>start IV lasix  DVT (deep venous thrombosis) ([place]) - Continue treatment with Eliquis -In the ED bilateral lower extremity ultrasound demonstrating no blood clots.  Chronic venous stasis dermatitis Appreciate wound care consult ----Care to the LEs will be to wash with soap and water daily, rinse and dry, then dress left LE wounds with MediHoney (antimicrobial gel) topped with silicone foam. Mild compression to both legs will be with a dry boot (ie., Kerlix wrapped from just below toes to just below knees, then topped with 6-inch ACE bandages). Floatation of heels by placing the feet into Prevalon boots is requested.   Laryngeal cancer [facility]) - Patient has expressed not interested in further evaluation or treatment for the cancer -Palliative has seen her in the outpatient setting and planning to establish care more consistently -She is DNR.  Tracheostomy status (HCC) CHRONIC RESPIRATORY FAILURE WITH HYPOXIA - ON 6-8 L AT [place] in place;  adequate saturation -stable  Tobacco abuse Tobacco  cessation discussed  Class 3 obesity (HCC) -Lifestyle modification -Body mass index is 41.79 kg/m.   Hypertension -Stable overall -Holding home antihypertensive medications secondary to soft BP   Family Communication:   no Family at bedside   Consultants:  palliative   Code Status:  FULL    DVT Prophylaxis:  apixaban     Procedures: As Listed in Progress Note Above   Antibiotics: Vanc 9/5>> Ceftriaxone 9/6>>9/8 Cefdinir 9/9>>         Subjective:  Patient states that her breathing is continuing to improve with IV Lasix.  She denies any chest pain, nausea, vomiting, direct abdominal pain.  There is no dysuria or hematuria. Objective: Vitals:   01/08/22 0537 01/08/22 0722 01/08/22 0725 01/08/22 1130  BP: (!) 119/55     Pulse: 65     Resp: (!) 22     Temp: 99.1 F (37.3 C)     TempSrc: Oral     SpO2: 95% 95% 95% 97%  Weight:      Height:        Intake/Output Summary (Last 24 hours) at 01/08/2022 1207 Last data filed at 01/08/2022 0900 Gross per 24 hour  Intake 480 ml  Output 1400 ml  Net -920 ml   Weight change:  Exam:  General:  Pt is alert, follows commands appropriately, not in acute distress HEENT: No icterus, No thrush, No neck mass, [place]/AT Cardiovascular: RRR, S1/S2, no rubs, no gallops Respiratory: Bibasilar crackles.  No wheezing.  Good air movement Abdomen: Soft/+BS, non tender, non distended, no guarding Extremities: 1 + LE edema, No lymphangitis, No petechiae, No rashes, no synovitis   Data Reviewed: I have personally reviewed following labs and imaging studies Basic Metabolic Panel: Recent Labs  Lab 01/03/22 1239 01/04/22 0325 01/05/22 0341 01/06/22 0334 01/07/22 0437 01/08/22 0511  NA  --  140 138 137 139 139  K  --  4.8 4.1 3.8 3.4* 3.1*  CL  --  109 110 107 107 106  CO2  --  '24 24 22 25 26  '$ GLUCOSE  --  92 73 92 78 80  BUN  --  39* 37* 31* 28* 20  CREATININE  --  2.23* 2.05* 1.73* 1.69* 1.44*  CALCIUM  --  8.5* 8.2* 8.0*  8.4* 8.0*  MG 2.6*  --   --  2.3  --  1.8   Liver Function Tests: Recent Labs  Lab 01/03/22 1204  AST 20  ALT 14  ALKPHOS 130*  BILITOT 1.3*  PROT 6.8  ALBUMIN 3.2*   No results for input(s): "LIPASE", "AMYLASE" in the last 168 hours. No results for input(s): "AMMONIA" in the last 168 hours. Coagulation Profile: No results for input(s): "INR", "PROTIME" in the last 168 hours. CBC: Recent Labs  Lab 01/03/22 1204 01/04/22 0325 01/05/22 0341  WBC 15.2* 12.5* 8.1  NEUTROABS 14.3*  --   --   HGB 10.7* 10.2* 9.2*  HCT 34.5* 33.5* 30.0*  MCV 93.5 95.4 94.9  PLT 211 207 173   Cardiac Enzymes: No results for input(s): "CKTOTAL", "CKMB", "CKMBINDEX", "TROPONINI" in the last 168 hours. BNP: Invalid input(s): "POCBNP" CBG: Recent Labs  Lab 01/03/22 1238  GLUCAP 103*   HbA1C: No results for input(s): "HGBA1C" in the last 72 hours. Urine analysis:    Component Value Date/Time   COLORURINE YELLOW 01/03/2022 1820   APPEARANCEUR HAZY (A) 01/03/2022 1820   LABSPEC 1.012 01/03/2022 1820   PHURINE 5.0  01/03/2022 1820   GLUCOSEU NEGATIVE 01/03/2022 1820   HGBUR LARGE (A) 01/03/2022 [place] 01/03/2022 1820   KETONESUR NEGATIVE 01/03/2022 1820   PROTEINUR 30 (A) 01/03/2022 1820   UROBILINOGEN 0.2 09/25/2010 0530   NITRITE POSITIVE (A) 01/03/2022 1820   LEUKOCYTESUR LARGE (A) 01/03/2022 1820   Sepsis Labs: '@LABRCNTIP'$ (procalcitonin:4,lacticidven:4) ) Recent Results (from the past 240 hour(s))  Culture, blood (routine x 2)     Status: None   Collection Time: 01/03/22 12:04 PM   Specimen: BLOOD RIGHT HAND  Result Value Ref Range Status   Specimen Description BLOOD RIGHT HAND  Final   Special Requests   Final    BOTTLES DRAWN AEROBIC AND ANAEROBIC Blood Culture results may not be optimal due to an inadequate volume of blood received in culture bottles   Culture   Final    NO GROWTH 5 DAYS Performed at [facility], [address] [place], [place]  [address]    Report Status 01/08/2022 FINAL  Final  Culture, blood (routine x 2)     Status: None   Collection Time: 01/03/22  1:45 PM   Specimen: BLOOD  Result Value Ref Range Status   Specimen Description BLOOD LEFT ANTECUBITAL  Final   Special Requests   Final    BOTTLES DRAWN AEROBIC AND ANAEROBIC Blood Culture results may not be optimal due to an inadequate volume of blood received in culture bottles   Culture   Final    NO GROWTH 5 DAYS Performed at [facility], [address], [place], [place] [address]    Report Status 01/08/2022 FINAL  Final  MRSA Next Gen by PCR, Nasal     Status: Abnormal   Collection Time: 01/04/22  8:04 AM   Specimen: Nasal Mucosa; Nasal Swab  Result Value Ref Range Status   MRSA by PCR Next Gen DETECTED (A) NOT DETECTED Final    Comment: RESULT CALLED TO, READ BACK BY AND VERIFIED WITH: Elijah Birk RN [phone] K FORSYTH (NOTE) The GeneXpert MRSA Assay (FDA approved for NASAL specimens only), is one component of a comprehensive MRSA colonization surveillance program. It is not intended to diagnose MRSA infection nor to guide or monitor treatment for MRSA infections. Test performance is not FDA approved in patients less than 81 years old. Performed at [facility], [address], [place], [place] [address]   Urine Culture     Status: Abnormal   Collection Time: 01/04/22 12:28 PM   Specimen: Urine, Clean Catch  Result Value Ref Range Status   Specimen Description   Final    URINE, CLEAN CATCH Performed at [facility], [address] [place], [place] [address]    Special Requests   Final    NONE Performed at [facility], [address] [place], [place] [address]    Culture 20,000 COLONIES/mL ESCHERICHIA COLI (A)  Final   Report Status 01/06/2022 FINAL  Final   Organism ID, Bacteria ESCHERICHIA COLI (A)  Final      Susceptibility   Escherichia coli - MIC*    AMPICILLIN >=32 RESISTANT Resistant     CEFAZOLIN <=4 SENSITIVE Sensitive     CEFEPIME  <=0.12 SENSITIVE Sensitive     CEFTRIAXONE <=0.25 SENSITIVE Sensitive     CIPROFLOXACIN <=0.25 SENSITIVE Sensitive     GENTAMICIN <=1 SENSITIVE Sensitive     IMIPENEM <=0.25 SENSITIVE Sensitive     NITROFURANTOIN <=16 SENSITIVE Sensitive     TRIMETH/SULFA <=20 SENSITIVE Sensitive  AMPICILLIN/SULBACTAM 16 INTERMEDIATE Intermediate     PIP/TAZO <=4 SENSITIVE Sensitive     * 20,000 COLONIES/mL ESCHERICHIA COLI     Scheduled Meds:  apixaban  5 mg Oral BID   budesonide  0.5 mg Nebulization BID   cefdinir  300 mg Oral Q12H   citalopram  20 mg Oral Daily   doxycycline  100 mg Oral Q12H   famotidine  20 mg Per Tube Daily   folic acid  1 mg Per Tube Daily   furosemide  40 mg Intravenous Once   [START ON 01/09/2022] furosemide  40 mg Oral BID   leptospermum manuka honey  1 Application Topical Daily   levETIRAcetam  750 mg Per Tube BID   levothyroxine  150 mcg Oral Daily   midodrine  5 mg Per Tube TID WC   polyethylene glycol  17 g Oral Daily   cyanocobalamin  500 mcg Per Tube Daily   Continuous Infusions:    Procedures/Studies: ECHOCARDIOGRAM COMPLETE  Result Date: 01/07/2022    ECHOCARDIOGRAM REPORT   Patient Name:   Veronica Horton Date of Exam: 01/07/2022 Medical Rec #:  [id number]       Height:       65.5 in Accession #:    [id number]      Weight:       255.0 lb Date of Birth:  Oct 08, 1960       BSA:          2.206 m Patient Age:    10 years        BP:           99/48 mmHg Patient Gender: F               HR:           72 bpm. Exam Location:  [facility] Procedure: 2D Echo, Color Doppler, Cardiac Doppler and Intracardiac            Opacification Agent Indications:    H84.69 Acute diastolic (congestive) heart failure  History:        Patient has prior history of Echocardiogram examinations, most                 recent 04/19/2021. CHF, Arrythmias:Atrial Fibrillation; Risk                 Factors:Hypertension, Diabetes, Dyslipidemia and Current Smoker.  Sonographer:    Raquel Sarna Senior RDCS  Referring Phys: [phone] Ciana Simmon  Sonographer Comments: Technically difficult due to patient body habitus. Patient is morbidly obese. IMPRESSIONS  1. Left ventricular ejection fraction, by estimation, is 65 to 70%. The left ventricle has normal function. The left ventricle has no regional wall motion abnormalities. Left ventricular diastolic parameters were normal.  2. Right ventricular systolic function is normal. The right ventricular size is normal. Tricuspid regurgitation signal is inadequate for assessing PA pressure.  3. The mitral valve is normal in structure. No evidence of mitral valve regurgitation. No evidence of mitral stenosis.  4. The aortic valve was not well visualized. Aortic valve regurgitation is not visualized. No aortic stenosis is present.  5. The inferior vena cava is dilated in size with <50% respiratory variability, suggesting right atrial pressure of 15 mmHg. FINDINGS  Left Ventricle: Left ventricular ejection fraction, by estimation, is 65 to 70%. The left ventricle has normal function. The left ventricle has no regional wall motion abnormalities. Definity contrast agent was given IV to delineate the left ventricular  endocardial borders. The left  ventricular internal cavity size was normal in size. There is no left ventricular hypertrophy. Left ventricular diastolic parameters were normal. Right Ventricle: The right ventricular size is normal. Right vetricular wall thickness was not well visualized. Right ventricular systolic function is normal. Tricuspid regurgitation signal is inadequate for assessing PA pressure. Left Atrium: Left atrial size was normal in size. Right Atrium: Right atrial size was normal in size. Pericardium: There is no evidence of pericardial effusion. Mitral Valve: The mitral valve is normal in structure. No evidence of mitral valve regurgitation. No evidence of mitral valve stenosis. Tricuspid Valve: The tricuspid valve is not well visualized. Tricuspid valve  regurgitation is trivial. No evidence of tricuspid stenosis. Aortic Valve: The aortic valve was not well visualized. Aortic valve regurgitation is not visualized. No aortic stenosis is present. Aortic valve mean gradient measures 4.0 mmHg. Aortic valve peak gradient measures 9.3 mmHg. Aortic valve area, by VTI measures 2.33 cm. Pulmonic Valve: The pulmonic valve was not well visualized. Pulmonic valve regurgitation is not visualized. No evidence of pulmonic stenosis. Aorta: The aortic root is normal in size and structure. Venous: The inferior vena cava is dilated in size with less than 50% respiratory variability, suggesting right atrial pressure of 15 mmHg. IAS/Shunts: The interatrial septum was not well visualized.  LEFT VENTRICLE PLAX 2D LVIDd:         4.60 cm LVIDs:         2.50 cm LV PW:         0.80 cm LV IVS:        1.00 cm LVOT diam:     1.80 cm LV SV:         67 LV SV Index:   30 LVOT Area:     2.54 cm  RIGHT VENTRICLE RV S prime:     13.30 cm/s TAPSE (M-mode): 2.0 cm LEFT ATRIUM             Index        RIGHT ATRIUM           Index LA diam:        3.60 cm 1.63 cm/m   RA Area:     20.30 cm LA Vol (A2C):   45.4 ml 20.58 ml/m  RA Volume:   64.40 ml  29.20 ml/m LA Vol (A4C):   63.1 ml 28.61 ml/m LA Biplane Vol: 54.0 ml 24.48 ml/m  AORTIC VALVE AV Area (Vmax):    2.06 cm AV Area (Vmean):   2.15 cm AV Area (VTI):     2.33 cm AV Vmax:           152.23 cm/s AV Vmean:          92.193 cm/s AV VTI:            0.289 m AV Peak Grad:      9.3 mmHg AV Mean Grad:      4.0 mmHg LVOT Vmax:         123.00 cm/s LVOT Vmean:        78.000 cm/s LVOT VTI:          0.264 m LVOT/AV VTI ratio: 0.91  AORTA Ao Root diam: 3.00 cm Ao Asc diam:  2.90 cm  SHUNTS Systemic VTI:  0.26 m Systemic Diam: 1.80 cm Carlyle Dolly MD Electronically signed by Carlyle Dolly MD Signature Date/Time: 01/07/2022/3:23:07 PM    Final    CT CHEST WO CONTRAST  Result Date: 01/04/2022 CLINICAL DATA:  A 61 year old female with  chronic dyspnea  presents for evaluation in the setting of abnormal chest radiograph. EXAM: CT CHEST WITHOUT CONTRAST TECHNIQUE: Multidetector CT imaging of the chest was performed following the standard protocol without IV contrast. RADIATION DOSE REDUCTION: This exam was performed according to the departmental dose-optimization program which includes automated exposure control, adjustment of the mA and/or kV according to patient size and/or use of iterative reconstruction technique. COMPARISON:  December of 2021 FINDINGS: Cardiovascular: Scattered aortic atherosclerosis. Three-vessel coronary artery disease. Normal heart size without pericardial effusion or nodularity. Aberrant RIGHT subclavian artery. Mild engorgement of central pulmonary vasculature. Limited assessment of cardiovascular structures given lack of intravenous contrast. Mediastinum/Nodes: Tracheostomy tube terminating in the proximal trachea. No signs of adenopathy in the chest. No acute process in the mediastinum. Esophagus is grossly normal. Lungs/Pleura: Patchy airspace disease in the chest in LEFT upper lobe, lingula, LEFT lower lobe and RIGHT lung base. Volume loss in the LEFT lung base associated also with consolidative changes and material in bronchial structures. No pneumothorax. Trace pleural effusions. Airways are otherwise patent. Upper Abdomen: Incidental imaging of upper abdominal contents without acute process to the extent evaluated. Musculoskeletal: Spinal degenerative changes. No acute or destructive bone process. Rib fractures of varying ages, no acute fractures most are chronic or subacute and without displacement. Fracture at the T9 level with 20-30% loss of height. Vacuum phenomenon within the fracture site. No surrounding stranding. T11 with loss of height as well. These have occurred since December of 2021. No listhesis. IMPRESSION: 1. Multifocal pneumonia with acute on chronic sequela of aspiration favored at the LEFT lung base. 2.  Age-indeterminate compression fractures potentially subacute/recent at the T9 level and T11 level are new since 2021. Correlate with any history of prior fracture to these areas and with any current symptoms. 3. Aortic atherosclerosis and coronary artery disease. Aortic Atherosclerosis (ICD10-I70.0). Electronically Signed   By: Zetta Bills M.D.   On: 01/04/2022 17:23   DG Chest Portable 1 View  Result Date: 01/03/2022 CLINICAL DATA:  Provided history: Shortness of breath. EXAM: PORTABLE CHEST 1 VIEW COMPARISON:  Prior chest radiographs 08/08/2021 and earlier. FINDINGS: A tracheostomy tube is noted. Cardiomegaly. Central pulmonary vascular congestion. Prominence of the interstitial lung markings bilaterally, compatible with pulmonary interstitial edema. Irregular and nodular opacities within the right lung base. The nodular opacities measure up to 16 mm. Irregular opacities within the left lung base. No evidence of pneumothorax. No acute bony abnormality identified. IMPRESSION: Cardiomegaly with pulmonary interstitial edema. Opacities within bilateral lung bases, which may reflect atelectasis and/or pneumonia. Superimposed more nodular opacities within the right lung base. A chest CT is recommended to better assess for possible pulmonary nodules at this site. Small left pleural effusion, which may be partially loculated. Electronically Signed   By: Kellie Simmering D.O.   On: 01/03/2022 14:50   US Venous Img Lower Bilateral  Result Date: 01/03/2022 CLINICAL DATA:  Bilateral open wounds. EXAM: BILATERAL LOWER EXTREMITY VENOUS DOPPLER ULTRASOUND TECHNIQUE: Gray-scale sonography with graded compression, as well as color Doppler and duplex ultrasound were performed to evaluate the lower extremity deep venous systems from the level of the common femoral vein and including the common femoral, femoral, profunda femoral, popliteal and calf veins including the posterior tibial, peroneal and gastrocnemius veins when  visible. The superficial great saphenous vein was also interrogated. Spectral Doppler was utilized to evaluate flow at rest and with distal augmentation maneuvers in the common femoral, femoral and popliteal veins. COMPARISON:  None Available. FINDINGS: RIGHT LOWER EXTREMITY Common  Femoral Vein: No evidence of thrombus. Normal compressibility, respiratory phasicity and response to augmentation. Saphenofemoral Junction: No evidence of thrombus. Normal compressibility and flow on color Doppler imaging. Profunda Femoral Vein: No evidence of thrombus. Normal compressibility and flow on color Doppler imaging. Femoral Vein: No evidence of thrombus. Normal compressibility, respiratory phasicity and response to augmentation. Popliteal Vein: No evidence of thrombus. Normal compressibility, respiratory phasicity and response to augmentation. Calf Veins: Limited evaluation. Superficial Great Saphenous Vein: Limited evaluation. Other Findings:  Large amount of subcutaneous edema. LEFT LOWER EXTREMITY Common Femoral Vein: No evidence of thrombus. Normal compressibility, respiratory phasicity and response to augmentation. Saphenofemoral Junction: No evidence of thrombus. Normal compressibility and flow on color Doppler imaging. Profunda Femoral Vein: No evidence of thrombus. Normal compressibility and flow on color Doppler imaging. Femoral Vein: No evidence of thrombus. Normal compressibility, respiratory phasicity and response to augmentation. Popliteal Vein: No evidence of thrombus. Normal compressibility, respiratory phasicity and response to augmentation. Calf Veins: Limited evaluation. Superficial Great Saphenous Vein: No evidence of thrombus. Normal compressibility. Other Findings:  Subcutaneous edema. IMPRESSION: No evidence of deep venous thrombosis in either lower extremity. However, the study has technical limitations due to body habitus and edema. Limited evaluation the bilateral deep calf veins. Electronically Signed    By: Markus Daft M.D.   On: 01/03/2022 13:45    Orson Eva, DO  [facility]  If 7PM-7AM, please contact night-coverage [email or web address] Password [email or web address] 01/08/2022, 12:07 PM   LOS: 5 days

## 2022-01-09 ENCOUNTER — Telehealth: Payer: Self-pay

## 2022-01-09 DIAGNOSIS — I4729 Other ventricular tachycardia: Secondary | ICD-10-CM

## 2022-01-09 DIAGNOSIS — I5031 Acute diastolic (congestive) heart failure: Secondary | ICD-10-CM | POA: Diagnosis not present

## 2022-01-09 DIAGNOSIS — L03116 Cellulitis of left lower limb: Secondary | ICD-10-CM | POA: Diagnosis not present

## 2022-01-09 DIAGNOSIS — J690 Pneumonitis due to inhalation of food and vomit: Secondary | ICD-10-CM | POA: Diagnosis not present

## 2022-01-09 DIAGNOSIS — R000 Tachycardia, unspecified: Secondary | ICD-10-CM

## 2022-01-09 DIAGNOSIS — I872 Venous insufficiency (chronic) (peripheral): Secondary | ICD-10-CM

## 2022-01-09 DIAGNOSIS — N179 Acute kidney failure, unspecified: Secondary | ICD-10-CM | POA: Diagnosis not present

## 2022-01-09 LAB — BASIC METABOLIC PANEL
Anion gap: 9 (ref 5–15)
BUN: 16 mg/dL (ref 8–23)
CO2: 28 mmol/L (ref 22–32)
Calcium: 8.2 mg/dL — ABNORMAL LOW (ref 8.9–10.3)
Chloride: 102 mmol/L (ref 98–111)
Creatinine, Ser: 1.44 mg/dL — ABNORMAL HIGH (ref 0.44–1.00)
GFR, Estimated: 41 mL/min — ABNORMAL LOW (ref >60)
Glucose, Bld: 87 mg/dL (ref 70–99)
Potassium: 3 mmol/L — ABNORMAL LOW (ref 3.5–5.1)
Sodium: 139 mmol/L (ref 135–145)

## 2022-01-09 LAB — MAGNESIUM: Magnesium: 1.7 mg/dL (ref 1.7–2.4)

## 2022-01-09 MED ORDER — METOPROLOL TARTRATE 25 MG PO TABS
12.5000 mg | ORAL_TABLET | Freq: Two times a day (BID) | ORAL | Status: DC
  Administered 2022-01-09: 12.5 mg via ORAL
  Filled 2022-01-09: qty 1

## 2022-01-09 MED ORDER — MIDODRINE HCL 5 MG PO TABS: 5.0000 mg | ORAL_TABLET | Freq: Three times a day (TID) | ORAL | 1 refills | Status: DC

## 2022-01-09 MED ORDER — MAGNESIUM SULFATE 2 GM/50ML IV SOLN
2.0000 g | Freq: Once | INTRAVENOUS | Status: AC
  Administered 2022-01-09: 2 g via INTRAVENOUS
  Filled 2022-01-09: qty 50

## 2022-01-09 MED ORDER — CHLORHEXIDINE GLUCONATE CLOTH 2 % EX PADS: 6.0000 | MEDICATED_PAD | Freq: Every day | CUTANEOUS | Status: DC

## 2022-01-09 MED ORDER — CLINDAMYCIN HCL 300 MG PO CAPS: 300.0000 mg | ORAL_CAPSULE | Freq: Four times a day (QID) | ORAL | 0 refills | Status: DC

## 2022-01-09 MED ORDER — METOPROLOL TARTRATE 25 MG PO TABS: 12.5000 mg | ORAL_TABLET | Freq: Two times a day (BID) | ORAL | 1 refills | Status: DC

## 2022-01-09 MED ORDER — FUROSEMIDE 40 MG PO TABS: 40.0000 mg | ORAL_TABLET | Freq: Two times a day (BID) | ORAL | 1 refills | Status: DC

## 2022-01-09 MED ORDER — CEFDINIR 300 MG PO CAPS: 300.0000 mg | ORAL_CAPSULE | Freq: Two times a day (BID) | ORAL | 0 refills | Status: DC

## 2022-01-09 MED ORDER — MUPIROCIN 2 % EX OINT
1.0000 | TOPICAL_OINTMENT | Freq: Two times a day (BID) | CUTANEOUS | Status: DC
  Administered 2022-01-09: 1 via NASAL
  Filled 2022-01-09: qty 22

## 2022-01-09 MED ORDER — POTASSIUM CHLORIDE CRYS ER 20 MEQ PO TBCR
40.0000 meq | EXTENDED_RELEASE_TABLET | Freq: Four times a day (QID) | ORAL | Status: AC
  Administered 2022-01-09 (×2): 40 meq via ORAL
  Filled 2022-01-09 (×2): qty 2

## 2022-01-09 NOTE — Consult Note (Signed)
Veronica Horton with a history of COPD, tobacco abuse, chronic respiratory failure with tracheostomy on 6-8 L at home, laryngeal cancer status post tracheostomy, seizure disorder, GERD, hypertension, orthostatic hypotension presenting with generalized weakness and increasing left lower extremity redness and pain. The patient visited the emergency department at [facility] on 12/27/2026 with lower extremity edema and pain. The patient was discharged home from the ED with clindamycin with which she had been compliant. She continued to have left lower extremity pain and edema.  CT chest showed patchy airspace disease in LUL, LLL< RLL.  UA showed >50 WBC.  Urine culture showed E coli.  She was started on ceftriaxone to cover for UTI and aspiration pneumonia   We are asked to see for NSVT-several runs.K 3.0. Echo 01/07/22 normal LVEF 65-70% . Patient refuses to be seen today by cardiology and wants discharged. She's willing to be seen as outpatient. We will arrange outpatient f/u and Zio.   Current problems:  NSVT asymptomatic K 3.0 crt 1.44 Patient refuses to be seen today by Cardiology. Will arrange outpatient f/u and 2 week Zio  Acute CHF echo 01/07/22 normal LVEF 65-70% on echo 01/07/22  Aspiration pneumonia per primary team  UTI per primary team  Chronic resp failure with trach on 6-8L at home due to laryngeal cancer  HTN  History of orthostatic hypotension  Cellulites treated AKI on CKD Crt 2.58 (baseline 1.6-1.8)  History of DVT on eliquis

## 2022-01-09 NOTE — Consult Note (Signed)
See other note. Patient refused to be seen today

## 2022-01-09 NOTE — Discharge Summary (Signed)
Physician Discharge Summary   Patient: Veronica Horton MRN: [id number] DOB: Sep 09, 1960  Admit date:     01/03/2022  Discharge date: 01/09/22  Discharge Physician: Shanon Brow Lambros Cerro   PCP: [facility]   Recommendations at discharge:   Please follow up with primary care provider within 1-2 weeks  Please repeat BMP and CBC in one week  Hospital Course: 61 year old female with a history of COPD, tobacco abuse, chronic respiratory failure with tracheostomy on 6-8 L at home, laryngeal cancer status post tracheostomy, seizure disorder, GERD, hypertension, orthostatic hypotension presenting with generalized weakness and increasing left lower extremity redness and pain.  The patient visited the emergency department at [facility] on 12/27/2026 with lower extremity edema and pain.  The patient was discharged home from the ED with clindamycin with which she had been compliant.  She continued to have left lower extremity pain and edema.  She stated that she had her left leg against the wall approximately 1 week prior to this admission.  She denies any other recent injuries or falls.  She uses a walker at baseline.  She continues to smoke 1/2 pack/day.  She denies any fevers, chills, chest pain, coughing, hemoptysis, nausea, vomiting, diarrhea.  She states that she has chronic shortness of breath which is not any worse than usual. In the ED, the patient was afebrile hemodynamically stable with oxygen saturation 91-93% on 8 L.  WBC 15.2, hemoglobin 10.7, platelets 207,000.  Sodium 140, potassium 4.8, bicarbonate 24, serum creatinine 2.23.  The patient was started on IV vancomycin and IV fluids.  CT chest showed patchy airspace disease in LUL, LLL< RLL.  UA showed >50 WBC.  Urine culture showed E coli.  She was started on ceftriaxone to cover for UTI and aspiration pneumonia.  The patient's cellulitis and pneumonia were clinically improving.  Fortunately, the patient's hospitalization has been prolonged  secondary to development of fluid overload and acute diastolic CHF for which the patient was started on IV furosemide with clinical improvement.  Assessment and Plan: * Lower extremity cellulitis - Patient not meeting criteria for sepsis currently. -Found with purulent cellulitis bilaterally (affecting left more than right). -Failed outpatient clindamycin -Continue vancomycin>>slowly improving>>doxy -Wound care consult appreciated.  Aspiration pneumonitis (HCC) CT chest as discussed above Continue ceftriaxone>>cefdinir>>home with 3 more days Speech therapy eval>>dys 3 with thin  Hypokalemia replete  Wide-complex tachycardia 01/08/22--patient had nonsustained episodes of Vtach; she was asymptomatic lying in bed --9/10 echo--EF 65 to 70%, no WMA. Normal RV; trivial TR --9/10 EKG--personally reviewed--sinus rhythm, no ST-T wave change -- Cardiology consult was placed and attempted -- Patient told the cardiology service that she did not want to be seen nor have further inpatient work-up at this time; she understood the risks involved of refusing further work-up including life-threatening arrhythmia; patient was willing to wear heart monitor upon discharge and to follow-up in the outpatient setting with cardiology -- Again, patient expressed understanding of the risks of dysrhythmia including but not limited to syncope, shortness of breath, and death -- Her electrolytes were optimized; cardiology will arrange outpatient follow-up and outpatient heart monitor -Start low-dose metoprolol 12.5 mg twice daily  Acute diastolic CHF (congestive heart failure) ([place]) Clinically fluid overloaded>> improved at the time of discharge Started IV lasix 9/8--continue>> discharged home with furosemide 40 mg twice daily po --9/10 echo--EF 65 to 70%, no WMA. Normal RV; trivial TR  UTI (urinary tract infection) 9/5 UA >50 WBC Urine culture= Ecoli Initially started on ceftriaxone>>cefdinir>>home with 3  more  days  Acute renal failure superimposed on stage 3b chronic kidney disease ([place]) Baseline creatinine 1.6-1.8 Presented with serum creatinine 2.58 Secondary to volume depletion Patient was told to stop her furosemide by her PCP recently Continue judicious IV fluids>>saline lock Now fluid overloaded>>start IV lasix  DVT (deep venous thrombosis) ([place]) - Continue treatment with Eliquis -In the ED bilateral lower extremity ultrasound demonstrating no blood clots.  Chronic venous stasis dermatitis Appreciate wound care consult ----Care to the LEs will be to wash with soap and water daily, rinse and dry, then dress left LE wounds with MediHoney (antimicrobial gel) topped with silicone foam. Mild compression to both legs will be with a dry boot (ie., Kerlix wrapped from just below toes to just below knees, then topped with 6-inch ACE bandages). Floatation of heels by placing the feet into Prevalon boots is requested.   Laryngeal cancer [facility]) - Patient has expressed not interested in further evaluation or treatment for the cancer -Palliative has seen her in the outpatient setting and planning to establish care more consistently -She is DNR.  Tracheostomy status ([place]) CHRONIC RESPIRATORY FAILURE WITH HYPOXIA - ON 6-8 L AT [place] in place; adequate saturation -stable  Tobacco abuse Tobacco cessation discussed  Class 3 obesity (HCC) -Lifestyle modification -Body mass index is 41.79 kg/m.   Hypertension -Stable overall -Holding home antihypertensive medications secondary to soft BP         Consultants: cardiology Procedures performed: none  Disposition: Home Diet recommendation:  Cardiac diet DISCHARGE MEDICATION: Allergies as of 01/09/2022       Reactions   Codeine Hives, Itching   Reports itching only per RN   Penicillins Hives   Did it involve swelling of the face/tongue/throat, SOB, or low BP? No Did it involve sudden or severe rash/hives, skin peeling, or any  reaction on the inside of your mouth or nose? Yes Did you need to seek medical attention at a hospital or doctor's office? Unknown When did it last happen?      Over 10 years If all above answers are "NO", may proceed with cephalosporin use.        Medication List     STOP taking these medications    dexamethasone 4 MG tablet Commonly known as: DECADRON   famotidine 20 MG tablet Commonly known as: PEPCID   losartan 25 MG tablet Commonly known as: COZAAR       TAKE these medications    apixaban 5 MG Tabs tablet Commonly known as: ELIQUIS Take 1 tablet (5 mg total) by mouth 2 (two) times daily. Restart taking only after next Monday(8/22)   budesonide 0.5 MG/2ML nebulizer solution Commonly known as: PULMICORT Take 2 mLs (0.5 mg total) by nebulization 2 (two) times daily.   calcitRIOL 0.25 MCG capsule Commonly known as: ROCALTROL Take by mouth.   cefdinir 300 MG capsule Commonly known as: OMNICEF Take 1 capsule (300 mg total) by mouth every 12 (twelve) hours.   clindamycin 300 MG capsule Commonly known as: CLEOCIN Take 1 capsule (300 mg total) by mouth every 6 (six) hours.   cyanocobalamin 1000 MCG tablet Commonly known as: VITAMIN B12 Place 0.5 tablets (500 mcg total) into feeding tube daily.   D3 Super Strength 50 MCG (2000 UT) Caps Generic drug: Cholecalciferol Take 1 capsule by mouth daily.   escitalopram 5 MG tablet Commonly known as: LEXAPRO Place 1 tablet (5 mg total) into feeding tube daily.   folic acid 1 MG tablet Commonly known as: [employer]  Place 1 tablet (1 mg total) into feeding tube daily.   furosemide 40 MG tablet Commonly known as: LASIX Take 1 tablet (40 mg total) by mouth 2 (two) times daily. What changed: how to take this   gabapentin 300 MG capsule Commonly known as: NEURONTIN Place 2 capsules (600 mg total) into feeding tube at bedtime. What changed:  how much to take when to take this reasons to take this   Gerhardt's butt  cream Crea Apply 1 application. topically 3 (three) times daily as needed for irritation.   ipratropium-albuterol 0.5-2.5 (3) MG/3ML Soln Commonly known as: DUONEB Take 3 mLs by nebulization every 6 (six) hours as needed.   levETIRAcetam 100 MG/ML solution Commonly known as: KEPPRA Place 7.5 mLs (750 mg total) into feeding tube 2 (two) times daily.   levothyroxine 150 MCG tablet Commonly known as: SYNTHROID Take 150 mcg by mouth daily.   lidocaine 5 % Commonly known as: LIDODERM Place 1 patch onto the skin daily. Remove & Discard patch within 12 hours or as directed by MD   magic mouthwash w/lidocaine Soln Take 10 mLs by mouth 3 (three) times daily as needed for mouth pain. SWISH AND SPIT. DO NOT SWALLOW.   metoprolol tartrate 25 MG tablet Commonly known as: LOPRESSOR Take 0.5 tablets (12.5 mg total) by mouth 2 (two) times daily.   midodrine 5 MG tablet Commonly known as: PROAMATINE Place 1 tablet (5 mg total) into feeding tube 3 (three) times daily with meals. What changed: when to take this   polyethylene glycol powder 17 GM/SCOOP powder Commonly known as: GLYCOLAX/MIRALAX Dissolve 1 capful (17 g) in liquid and add into feeding tube daily as needed for moderate constipation.   potassium chloride 20 MEQ/15ML (10%) Soln Take 15 mLs by mouth daily.   Ventolin HFA 108 (90 Base) MCG/ACT inhaler Generic drug: albuterol Inhale 1 puff into the lungs every 4 (four) hours as needed for wheezing or shortness of breath.        Discharge Exam: Filed Weights   01/03/22 1140  Weight: 115.7 kg   HEENT:  [place]/AT, No thrush, no icterus CV:  RRR, no rub, no S3, no S4 Lung: Bibasilar rales.  No wheezing Abd:  soft/+BS, NT Ext:  trace LE edema, no lymphangitis, no synovitis, no rash   Condition at discharge: stable  The results of significant diagnostics from this hospitalization (including imaging, microbiology, ancillary and laboratory) are listed below for reference.    Imaging Studies: ECHOCARDIOGRAM COMPLETE  Result Date: 01/07/2022    ECHOCARDIOGRAM REPORT   Patient Name:   Veronica Horton Date of Exam: 01/07/2022 Medical Rec #:  [id number]       Height:       65.5 in Accession #:    [id number]      Weight:       255.0 lb Date of Birth:  1960-06-11       BSA:          2.206 m Patient Age:    36 years        BP:           99/48 mmHg Patient Gender: F               HR:           72 bpm. Exam Location:  [facility] Procedure: 2D Echo, Color Doppler, Cardiac Doppler and Intracardiac            Opacification Agent Indications:  G40.10 Acute diastolic (congestive) heart failure  History:        Patient has prior history of Echocardiogram examinations, most                 recent 04/19/2021. CHF, Arrythmias:Atrial Fibrillation; Risk                 Factors:Hypertension, Diabetes, Dyslipidemia and Current Smoker.  Sonographer:    Raquel Sarna Senior RDCS Referring Phys: [phone] Abraham Margulies  Sonographer Comments: Technically difficult due to patient body habitus. Patient is morbidly obese. IMPRESSIONS  1. Left ventricular ejection fraction, by estimation, is 65 to 70%. The left ventricle has normal function. The left ventricle has no regional wall motion abnormalities. Left ventricular diastolic parameters were normal.  2. Right ventricular systolic function is normal. The right ventricular size is normal. Tricuspid regurgitation signal is inadequate for assessing PA pressure.  3. The mitral valve is normal in structure. No evidence of mitral valve regurgitation. No evidence of mitral stenosis.  4. The aortic valve was not well visualized. Aortic valve regurgitation is not visualized. No aortic stenosis is present.  5. The inferior vena cava is dilated in size with <50% respiratory variability, suggesting right atrial pressure of 15 mmHg. FINDINGS  Left Ventricle: Left ventricular ejection fraction, by estimation, is 65 to 70%. The left ventricle has normal function. The left ventricle has no  regional wall motion abnormalities. Definity contrast agent was given IV to delineate the left ventricular  endocardial borders. The left ventricular internal cavity size was normal in size. There is no left ventricular hypertrophy. Left ventricular diastolic parameters were normal. Right Ventricle: The right ventricular size is normal. Right vetricular wall thickness was not well visualized. Right ventricular systolic function is normal. Tricuspid regurgitation signal is inadequate for assessing PA pressure. Left Atrium: Left atrial size was normal in size. Right Atrium: Right atrial size was normal in size. Pericardium: There is no evidence of pericardial effusion. Mitral Valve: The mitral valve is normal in structure. No evidence of mitral valve regurgitation. No evidence of mitral valve stenosis. Tricuspid Valve: The tricuspid valve is not well visualized. Tricuspid valve regurgitation is trivial. No evidence of tricuspid stenosis. Aortic Valve: The aortic valve was not well visualized. Aortic valve regurgitation is not visualized. No aortic stenosis is present. Aortic valve mean gradient measures 4.0 mmHg. Aortic valve peak gradient measures 9.3 mmHg. Aortic valve area, by VTI measures 2.33 cm. Pulmonic Valve: The pulmonic valve was not well visualized. Pulmonic valve regurgitation is not visualized. No evidence of pulmonic stenosis. Aorta: The aortic root is normal in size and structure. Venous: The inferior vena cava is dilated in size with less than 50% respiratory variability, suggesting right atrial pressure of 15 mmHg. IAS/Shunts: The interatrial septum was not well visualized.  LEFT VENTRICLE PLAX 2D LVIDd:         4.60 cm LVIDs:         2.50 cm LV PW:         0.80 cm LV IVS:        1.00 cm LVOT diam:     1.80 cm LV SV:         67 LV SV Index:   30 LVOT Area:     2.54 cm  RIGHT VENTRICLE RV S prime:     13.30 cm/s TAPSE (M-mode): 2.0 cm LEFT ATRIUM             Index        RIGHT ATRIUM  Index  LA diam:        3.60 cm 1.63 cm/m   RA Area:     20.30 cm LA Vol (A2C):   45.4 ml 20.58 ml/m  RA Volume:   64.40 ml  29.20 ml/m LA Vol (A4C):   63.1 ml 28.61 ml/m LA Biplane Vol: 54.0 ml 24.48 ml/m  AORTIC VALVE AV Area (Vmax):    2.06 cm AV Area (Vmean):   2.15 cm AV Area (VTI):     2.33 cm AV Vmax:           152.23 cm/s AV Vmean:          92.193 cm/s AV VTI:            0.289 m AV Peak Grad:      9.3 mmHg AV Mean Grad:      4.0 mmHg LVOT Vmax:         123.00 cm/s LVOT Vmean:        78.000 cm/s LVOT VTI:          0.264 m LVOT/AV VTI ratio: 0.91  AORTA Ao Root diam: 3.00 cm Ao Asc diam:  2.90 cm  SHUNTS Systemic VTI:  0.26 m Systemic Diam: 1.80 cm Carlyle Dolly MD Electronically signed by Carlyle Dolly MD Signature Date/Time: 01/07/2022/3:23:07 PM    Final    CT CHEST WO CONTRAST  Result Date: 01/04/2022 CLINICAL DATA:  A 61 year old female with chronic dyspnea presents for evaluation in the setting of abnormal chest radiograph. EXAM: CT CHEST WITHOUT CONTRAST TECHNIQUE: Multidetector CT imaging of the chest was performed following the standard protocol without IV contrast. RADIATION DOSE REDUCTION: This exam was performed according to the departmental dose-optimization program which includes automated exposure control, adjustment of the mA and/or kV according to patient size and/or use of iterative reconstruction technique. COMPARISON:  December of 2021 FINDINGS: Cardiovascular: Scattered aortic atherosclerosis. Three-vessel coronary artery disease. Normal heart size without pericardial effusion or nodularity. Aberrant RIGHT subclavian artery. Mild engorgement of central pulmonary vasculature. Limited assessment of cardiovascular structures given lack of intravenous contrast. Mediastinum/Nodes: Tracheostomy tube terminating in the proximal trachea. No signs of adenopathy in the chest. No acute process in the mediastinum. Esophagus is grossly normal. Lungs/Pleura: Patchy airspace disease in the chest in  LEFT upper lobe, lingula, LEFT lower lobe and RIGHT lung base. Volume loss in the LEFT lung base associated also with consolidative changes and material in bronchial structures. No pneumothorax. Trace pleural effusions. Airways are otherwise patent. Upper Abdomen: Incidental imaging of upper abdominal contents without acute process to the extent evaluated. Musculoskeletal: Spinal degenerative changes. No acute or destructive bone process. Rib fractures of varying ages, no acute fractures most are chronic or subacute and without displacement. Fracture at the T9 level with 20-30% loss of height. Vacuum phenomenon within the fracture site. No surrounding stranding. T11 with loss of height as well. These have occurred since December of 2021. No listhesis. IMPRESSION: 1. Multifocal pneumonia with acute on chronic sequela of aspiration favored at the LEFT lung base. 2. Age-indeterminate compression fractures potentially subacute/recent at the T9 level and T11 level are new since 2021. Correlate with any history of prior fracture to these areas and with any current symptoms. 3. Aortic atherosclerosis and coronary artery disease. Aortic Atherosclerosis (ICD10-I70.0). Electronically Signed   By: Zetta Bills M.D.   On: 01/04/2022 17:23   DG Chest Portable 1 View  Result Date: 01/03/2022 CLINICAL DATA:  Provided history: Shortness of breath. EXAM: PORTABLE CHEST 1  VIEW COMPARISON:  Prior chest radiographs 08/08/2021 and earlier. FINDINGS: A tracheostomy tube is noted. Cardiomegaly. Central pulmonary vascular congestion. Prominence of the interstitial lung markings bilaterally, compatible with pulmonary interstitial edema. Irregular and nodular opacities within the right lung base. The nodular opacities measure up to 16 mm. Irregular opacities within the left lung base. No evidence of pneumothorax. No acute bony abnormality identified. IMPRESSION: Cardiomegaly with pulmonary interstitial edema. Opacities within bilateral  lung bases, which may reflect atelectasis and/or pneumonia. Superimposed more nodular opacities within the right lung base. A chest CT is recommended to better assess for possible pulmonary nodules at this site. Small left pleural effusion, which may be partially loculated. Electronically Signed   By: Kellie Simmering D.O.   On: 01/03/2022 14:50   US Venous Img Lower Bilateral  Result Date: 01/03/2022 CLINICAL DATA:  Bilateral open wounds. EXAM: BILATERAL LOWER EXTREMITY VENOUS DOPPLER ULTRASOUND TECHNIQUE: Gray-scale sonography with graded compression, as well as color Doppler and duplex ultrasound were performed to evaluate the lower extremity deep venous systems from the level of the common femoral vein and including the common femoral, femoral, profunda femoral, popliteal and calf veins including the posterior tibial, peroneal and gastrocnemius veins when visible. The superficial great saphenous vein was also interrogated. Spectral Doppler was utilized to evaluate flow at rest and with distal augmentation maneuvers in the common femoral, femoral and popliteal veins. COMPARISON:  None Available. FINDINGS: RIGHT LOWER EXTREMITY Common Femoral Vein: No evidence of thrombus. Normal compressibility, respiratory phasicity and response to augmentation. Saphenofemoral Junction: No evidence of thrombus. Normal compressibility and flow on color Doppler imaging. Profunda Femoral Vein: No evidence of thrombus. Normal compressibility and flow on color Doppler imaging. Femoral Vein: No evidence of thrombus. Normal compressibility, respiratory phasicity and response to augmentation. Popliteal Vein: No evidence of thrombus. Normal compressibility, respiratory phasicity and response to augmentation. Calf Veins: Limited evaluation. Superficial Great Saphenous Vein: Limited evaluation. Other Findings:  Large amount of subcutaneous edema. LEFT LOWER EXTREMITY Common Femoral Vein: No evidence of thrombus. Normal compressibility,  respiratory phasicity and response to augmentation. Saphenofemoral Junction: No evidence of thrombus. Normal compressibility and flow on color Doppler imaging. Profunda Femoral Vein: No evidence of thrombus. Normal compressibility and flow on color Doppler imaging. Femoral Vein: No evidence of thrombus. Normal compressibility, respiratory phasicity and response to augmentation. Popliteal Vein: No evidence of thrombus. Normal compressibility, respiratory phasicity and response to augmentation. Calf Veins: Limited evaluation. Superficial Great Saphenous Vein: No evidence of thrombus. Normal compressibility. Other Findings:  Subcutaneous edema. IMPRESSION: No evidence of deep venous thrombosis in either lower extremity. However, the study has technical limitations due to body habitus and edema. Limited evaluation the bilateral deep calf veins. Electronically Signed   By: Markus Daft M.D.   On: 01/03/2022 13:45    Microbiology: Results for orders placed or performed during the hospital encounter of 01/03/22  Culture, blood (routine x 2)     Status: None   Collection Time: 01/03/22 12:04 PM   Specimen: BLOOD RIGHT HAND  Result Value Ref Range Status   Specimen Description BLOOD RIGHT HAND  Final   Special Requests   Final    BOTTLES DRAWN AEROBIC AND ANAEROBIC Blood Culture results may not be optimal due to an inadequate volume of blood received in culture bottles   Culture   Final    NO GROWTH 5 DAYS Performed at [facility], [address], [place], [place] [address]    Report Status 01/08/2022 FINAL  Final  Culture, blood (  routine x 2)     Status: None   Collection Time: 01/03/22  1:45 PM   Specimen: BLOOD  Result Value Ref Range Status   Specimen Description BLOOD LEFT ANTECUBITAL  Final   Special Requests   Final    BOTTLES DRAWN AEROBIC AND ANAEROBIC Blood Culture results may not be optimal due to an inadequate volume of blood received in culture bottles   Culture   Final    NO GROWTH 5  DAYS Performed at [facility], [address], [place], [place] [address]    Report Status 01/08/2022 FINAL  Final  MRSA Next Gen by PCR, Nasal     Status: Abnormal   Collection Time: 01/04/22  8:04 AM   Specimen: Nasal Mucosa; Nasal Swab  Result Value Ref Range Status   MRSA by PCR Next Gen DETECTED (A) NOT DETECTED Final    Comment: RESULT CALLED TO, READ BACK BY AND VERIFIED WITH: Elijah Birk RN [phone] K FORSYTH (NOTE) The GeneXpert MRSA Assay (FDA approved for NASAL specimens only), is one component of a comprehensive MRSA colonization surveillance program. It is not intended to diagnose MRSA infection nor to guide or monitor treatment for MRSA infections. Test performance is not FDA approved in patients less than 46 years old. Performed at [facility], [address] [place], [place] [address]   Urine Culture     Status: Abnormal   Collection Time: 01/04/22 12:28 PM   Specimen: Urine, Clean Catch  Result Value Ref Range Status   Specimen Description   Final    URINE, CLEAN CATCH Performed at [facility], [address], [place], [place] [address]    Special Requests   Final    NONE Performed at [facility], [address] [place],  [address]    Culture 20,000 COLONIES/mL ESCHERICHIA COLI (A)  Final   Report Status 01/06/2022 FINAL  Final   Organism ID, Bacteria ESCHERICHIA COLI (A)  Final      Susceptibility   Escherichia coli - MIC*    AMPICILLIN >=32 RESISTANT Resistant     CEFAZOLIN <=4 SENSITIVE Sensitive     CEFEPIME <=0.12 SENSITIVE Sensitive     CEFTRIAXONE <=0.25 SENSITIVE Sensitive     CIPROFLOXACIN <=0.25 SENSITIVE Sensitive     GENTAMICIN <=1 SENSITIVE Sensitive     IMIPENEM <=0.25 SENSITIVE Sensitive     NITROFURANTOIN <=16 SENSITIVE Sensitive     TRIMETH/SULFA <=20 SENSITIVE Sensitive     AMPICILLIN/SULBACTAM 16 INTERMEDIATE Intermediate     PIP/TAZO <=4 SENSITIVE Sensitive     * 20,000 COLONIES/mL ESCHERICHIA COLI    Labs: CBC: Recent  Labs  Lab 01/03/22 1204 01/04/22 0325 01/05/22 0341  WBC 15.2* 12.5* 8.1  NEUTROABS 14.3*  --   --   HGB 10.7* 10.2* 9.2*  HCT 34.5* 33.5* 30.0*  MCV 93.5 95.4 94.9  PLT 211 207 768   Basic Metabolic Panel: Recent Labs  Lab 01/03/22 1239 01/04/22 0325 01/05/22 0341 01/06/22 0334 01/07/22 0437 01/08/22 0511 01/09/22 0501  NA  --    < > 138 137 139 139 139  K  --    < > 4.1 3.8 3.4* 3.1* 3.0*  CL  --    < > 110 107 107 106 102  CO2  --    < > '24 22 25 26 28  '$ GLUCOSE  --    < > 73 92 78 80 87  BUN  --    < > 37* 31* 28* 20  16  CREATININE  --    < > 2.05* 1.73* 1.69* 1.44* 1.44*  CALCIUM  --    < > 8.2* 8.0* 8.4* 8.0* 8.2*  MG 2.6*  --   --  2.3  --  1.8 1.7   < > = values in this interval not displayed.   Liver Function Tests: Recent Labs  Lab 01/03/22 1204  AST 20  ALT 14  ALKPHOS 130*  BILITOT 1.3*  PROT 6.8  ALBUMIN 3.2*   CBG: Recent Labs  Lab 01/03/22 1238  GLUCAP 103*    Discharge time spent: greater than 30 minutes.  Signed: Orson Eva, MD [facility] 01/09/2022

## 2022-01-09 NOTE — TOC Transition Note (Addendum)
Transition of Care [facility]) - CM/SW Discharge Note   Patient Details  Name: Veronica Horton MRN: [id number] Date of Birth: 12/09/1960  [facility] CM/SW Contact:  Ihor Gully, LCSW Phone Number: 01/09/2022, 11:23 AM   Clinical Narrative:    Patient agreeable to OPPT Palliative services. Discussed palliative providers. Referral made to [facility] [place] for Palliative services.  Patient requests a trapeze bar. Attending notified and DME ordered with Zach at [facility].   Final next level of care: Other (comment) (home with [facility] palliative and EMS program) Barriers to Discharge: No Barriers Identified   Patient Goals and CMS Choice        Discharge Placement                       Discharge Plan and Services                                     Social Determinants of Health (SDOH) Interventions     Readmission Risk Interventions    08/09/2021    2:37 PM 04/26/2020   10:28 AM  Readmission Risk Prevention Plan  Transportation Screening Complete Complete  Medication Review ([place]) Complete Complete  PCP or Specialist appointment within 3-5 days of discharge  Complete  HRI or [place] Complete Complete  SW Recovery Care/Counseling Consult Complete Complete  Palliative Care Screening Not Applicable Not [place] Not Applicable Patient Refused

## 2022-01-09 NOTE — Progress Notes (Signed)
[facility] ([facility]) Hospital Liaison Note  Notified by TOC manager of patient/family request for [facility] palliative services at home after discharge.   [facility] hospital liaison will follow patient for discharge disposition.   Please call with any hospice or outpatient palliative care related questions.   Thank you for the opportunity to participate in this patient's care.   Shanita Wicker, LCSW [facility] Hospital Liaison [phone]  

## 2022-01-09 NOTE — Assessment & Plan Note (Addendum)
01/08/22--patient had nonsustained episodes of Vtach; she was asymptomatic lying in bed --9/10 echo--EF 65 to 70%, no WMA. Normal RV; trivial TR --9/10 EKG--personally reviewed--sinus rhythm, no ST-T wave change -- Cardiology consult was placed and attempted -- Patient told the cardiology service that she did not want to be seen nor have further inpatient work-up at this time; she understood the risks involved of refusing further work-up including life-threatening arrhythmia; patient was willing to wear heart monitor upon discharge and to follow-up in the outpatient setting with cardiology -- Again, patient expressed understanding of the risks of dysrhythmia including but not limited to syncope, shortness of breath, and death -- Her electrolytes were optimized; cardiology will arrange outpatient follow-up and outpatient heart monitor -Start low-dose metoprolol 12.5 mg twice daily

## 2022-01-09 NOTE — Telephone Encounter (Signed)
Per M.Lenze, PA-C: can you schedule this patient for a new patient visit with MD, next available? she also needs 2 week ZIO for NSVT-Cathy can you arrange? Thanks     Zio monitor ordered, to be shipped to patients home.

## 2022-01-10 ENCOUNTER — Telehealth: Payer: Self-pay

## 2022-01-10 NOTE — Telephone Encounter (Signed)
Spoke with Veronica Horton patient's SO and he reports patient was D/C from hospital on 9/11. She fell after coming home and had to go back to [facility] ER. Emailed hospital liaison to follow.

## 2022-01-12 ENCOUNTER — Telehealth: Payer: Self-pay

## 2022-01-12 NOTE — Telephone Encounter (Signed)
1115 am.  Follow up call made to patient.  Sister [name] answered call.  She states patient is now agreeable to a 30 day stay at the [facility] in [place] so wound care and rehab can be done.  [name] has already notified PCP of this desire and they will assist.  Update provided to Ralene Bathe, NP.

## 2022-01-14 ENCOUNTER — Other Ambulatory Visit: Payer: Self-pay

## 2022-01-14 ENCOUNTER — Encounter (HOSPITAL_COMMUNITY): Payer: Self-pay

## 2022-01-14 ENCOUNTER — Emergency Department (HOSPITAL_COMMUNITY): Payer: Medicaid Other

## 2022-01-14 ENCOUNTER — Inpatient Hospital Stay (HOSPITAL_COMMUNITY)
Admission: EM | Admit: 2022-01-14 | Discharge: 2022-02-01 | DRG: 177 | Disposition: A | Discharge status: Hospice | Payer: Medicaid Other | Attending: Family Medicine | Admitting: Family Medicine

## 2022-01-14 DIAGNOSIS — I472 Ventricular tachycardia, unspecified: Secondary | ICD-10-CM | POA: Diagnosis present

## 2022-01-14 DIAGNOSIS — J181 Lobar pneumonia, unspecified organism: Secondary | ICD-10-CM | POA: Diagnosis not present

## 2022-01-14 DIAGNOSIS — N179 Acute kidney failure, unspecified: Secondary | ICD-10-CM | POA: Diagnosis present

## 2022-01-14 DIAGNOSIS — D631 Anemia in chronic kidney disease: Secondary | ICD-10-CM | POA: Diagnosis present

## 2022-01-14 DIAGNOSIS — G40909 Epilepsy, unspecified, not intractable, without status epilepticus: Secondary | ICD-10-CM

## 2022-01-14 DIAGNOSIS — Z9981 Dependence on supplemental oxygen: Secondary | ICD-10-CM

## 2022-01-14 DIAGNOSIS — D329 Benign neoplasm of meninges, unspecified: Secondary | ICD-10-CM | POA: Diagnosis present

## 2022-01-14 DIAGNOSIS — I82409 Acute embolism and thrombosis of unspecified deep veins of unspecified lower extremity: Secondary | ICD-10-CM | POA: Diagnosis present

## 2022-01-14 DIAGNOSIS — D75839 Thrombocytosis, unspecified: Secondary | ICD-10-CM | POA: Diagnosis present

## 2022-01-14 DIAGNOSIS — Z930 Tracheostomy status: Secondary | ICD-10-CM

## 2022-01-14 DIAGNOSIS — G9341 Metabolic encephalopathy: Secondary | ICD-10-CM

## 2022-01-14 DIAGNOSIS — I5031 Acute diastolic (congestive) heart failure: Secondary | ICD-10-CM | POA: Diagnosis not present

## 2022-01-14 DIAGNOSIS — T17908A Unspecified foreign body in respiratory tract, part unspecified causing other injury, initial encounter: Secondary | ICD-10-CM | POA: Diagnosis not present

## 2022-01-14 DIAGNOSIS — J9621 Acute and chronic respiratory failure with hypoxia: Secondary | ICD-10-CM | POA: Diagnosis present

## 2022-01-14 DIAGNOSIS — B9620 Unspecified Escherichia coli [E. coli] as the cause of diseases classified elsewhere: Secondary | ICD-10-CM | POA: Diagnosis present

## 2022-01-14 DIAGNOSIS — Z9049 Acquired absence of other specified parts of digestive tract: Secondary | ICD-10-CM

## 2022-01-14 DIAGNOSIS — Z8521 Personal history of malignant neoplasm of larynx: Secondary | ICD-10-CM

## 2022-01-14 DIAGNOSIS — R627 Adult failure to thrive: Secondary | ICD-10-CM | POA: Diagnosis present

## 2022-01-14 DIAGNOSIS — Z885 Allergy status to narcotic agent status: Secondary | ICD-10-CM

## 2022-01-14 DIAGNOSIS — R1310 Dysphagia, unspecified: Secondary | ICD-10-CM | POA: Diagnosis not present

## 2022-01-14 DIAGNOSIS — E079 Disorder of thyroid, unspecified: Secondary | ICD-10-CM | POA: Diagnosis present

## 2022-01-14 DIAGNOSIS — T17990A Other foreign object in respiratory tract, part unspecified in causing asphyxiation, initial encounter: Secondary | ICD-10-CM | POA: Diagnosis present

## 2022-01-14 DIAGNOSIS — Z7901 Long term (current) use of anticoagulants: Secondary | ICD-10-CM

## 2022-01-14 DIAGNOSIS — J690 Pneumonitis due to inhalation of food and vomit: Secondary | ICD-10-CM | POA: Diagnosis present

## 2022-01-14 DIAGNOSIS — Z66 Do not resuscitate: Secondary | ICD-10-CM | POA: Diagnosis present

## 2022-01-14 DIAGNOSIS — Z7189 Other specified counseling: Secondary | ICD-10-CM | POA: Diagnosis not present

## 2022-01-14 DIAGNOSIS — Z6841 Body Mass Index (BMI) 40.0 and over, adult: Secondary | ICD-10-CM | POA: Diagnosis not present

## 2022-01-14 DIAGNOSIS — Z881 Allergy status to other antibiotic agents status: Secondary | ICD-10-CM

## 2022-01-14 DIAGNOSIS — I824Y9 Acute embolism and thrombosis of unspecified deep veins of unspecified proximal lower extremity: Secondary | ICD-10-CM | POA: Diagnosis not present

## 2022-01-14 DIAGNOSIS — M79605 Pain in left leg: Secondary | ICD-10-CM | POA: Diagnosis present

## 2022-01-14 DIAGNOSIS — Z515 Encounter for palliative care: Secondary | ICD-10-CM

## 2022-01-14 DIAGNOSIS — E876 Hypokalemia: Secondary | ICD-10-CM | POA: Diagnosis present

## 2022-01-14 DIAGNOSIS — J449 Chronic obstructive pulmonary disease, unspecified: Secondary | ICD-10-CM | POA: Diagnosis present

## 2022-01-14 DIAGNOSIS — I878 Other specified disorders of veins: Secondary | ICD-10-CM | POA: Diagnosis present

## 2022-01-14 DIAGNOSIS — F32A Depression, unspecified: Secondary | ICD-10-CM | POA: Diagnosis present

## 2022-01-14 DIAGNOSIS — X58XXXA Exposure to other specified factors, initial encounter: Secondary | ICD-10-CM | POA: Diagnosis present

## 2022-01-14 DIAGNOSIS — J189 Pneumonia, unspecified organism: Secondary | ICD-10-CM | POA: Diagnosis present

## 2022-01-14 DIAGNOSIS — Z1152 Encounter for screening for COVID-19: Secondary | ICD-10-CM | POA: Diagnosis not present

## 2022-01-14 DIAGNOSIS — J9601 Acute respiratory failure with hypoxia: Secondary | ICD-10-CM | POA: Diagnosis present

## 2022-01-14 DIAGNOSIS — F17210 Nicotine dependence, cigarettes, uncomplicated: Secondary | ICD-10-CM | POA: Diagnosis present

## 2022-01-14 DIAGNOSIS — J9622 Acute and chronic respiratory failure with hypercapnia: Secondary | ICD-10-CM | POA: Diagnosis present

## 2022-01-14 DIAGNOSIS — N1832 Acute kidney failure, unspecified: Secondary | ICD-10-CM | POA: Diagnosis present

## 2022-01-14 DIAGNOSIS — K219 Gastro-esophageal reflux disease without esophagitis: Secondary | ICD-10-CM | POA: Diagnosis present

## 2022-01-14 DIAGNOSIS — D649 Anemia, unspecified: Secondary | ICD-10-CM | POA: Diagnosis present

## 2022-01-14 DIAGNOSIS — I5033 Acute on chronic diastolic (congestive) heart failure: Secondary | ICD-10-CM | POA: Diagnosis present

## 2022-01-14 DIAGNOSIS — I130 Hypertensive heart and chronic kidney disease with heart failure and stage 1 through stage 4 chronic kidney disease, or unspecified chronic kidney disease: Secondary | ICD-10-CM | POA: Diagnosis present

## 2022-01-14 DIAGNOSIS — Z85819 Personal history of malignant neoplasm of unspecified site of lip, oral cavity, and pharynx: Secondary | ICD-10-CM

## 2022-01-14 DIAGNOSIS — T501X5A Adverse effect of loop [high-ceiling] diuretics, initial encounter: Secondary | ICD-10-CM | POA: Diagnosis present

## 2022-01-14 DIAGNOSIS — N390 Urinary tract infection, site not specified: Secondary | ICD-10-CM | POA: Diagnosis present

## 2022-01-14 DIAGNOSIS — Z880 Allergy status to penicillin: Secondary | ICD-10-CM

## 2022-01-14 DIAGNOSIS — Z91119 Patient's noncompliance with dietary regimen due to unspecified reason: Secondary | ICD-10-CM

## 2022-01-14 DIAGNOSIS — G8929 Other chronic pain: Secondary | ICD-10-CM | POA: Diagnosis present

## 2022-01-14 DIAGNOSIS — Z86718 Personal history of other venous thrombosis and embolism: Secondary | ICD-10-CM

## 2022-01-14 DIAGNOSIS — Z8249 Family history of ischemic heart disease and other diseases of the circulatory system: Secondary | ICD-10-CM

## 2022-01-14 DIAGNOSIS — T17908D Unspecified foreign body in respiratory tract, part unspecified causing other injury, subsequent encounter: Secondary | ICD-10-CM | POA: Diagnosis not present

## 2022-01-14 DIAGNOSIS — M549 Dorsalgia, unspecified: Secondary | ICD-10-CM | POA: Diagnosis present

## 2022-01-14 LAB — BASIC METABOLIC PANEL
Anion gap: 10 (ref 5–15)
BUN: 25 mg/dL — ABNORMAL HIGH (ref 8–23)
CO2: 30 mmol/L (ref 22–32)
Calcium: 8.7 mg/dL — ABNORMAL LOW (ref 8.9–10.3)
Chloride: 99 mmol/L (ref 98–111)
Creatinine, Ser: 2.44 mg/dL — ABNORMAL HIGH (ref 0.44–1.00)
GFR, Estimated: 22 mL/min — ABNORMAL LOW (ref >60)
Glucose, Bld: 94 mg/dL (ref 70–99)
Potassium: 3.4 mmol/L — ABNORMAL LOW (ref 3.5–5.1)
Sodium: 139 mmol/L (ref 135–145)

## 2022-01-14 LAB — BLOOD GAS, ARTERIAL
Acid-Base Excess: 3.4 mmol/L — ABNORMAL HIGH (ref 0.0–2.0)
Bicarbonate: 29.6 mmol/L — ABNORMAL HIGH (ref 20.0–28.0)
Drawn by: 10555
FIO2: 100 %
O2 Saturation: 87 %
Patient temperature: 37
pCO2 arterial: 50 mmHg — ABNORMAL HIGH (ref 32–48)
pH, Arterial: 7.38 (ref 7.35–7.45)
pO2, Arterial: 53 mmHg — ABNORMAL LOW (ref 83–108)

## 2022-01-14 LAB — CBC WITH DIFFERENTIAL/PLATELET
Abs Immature Granulocytes: 0.15 10*3/uL — ABNORMAL HIGH (ref 0.00–0.07)
Basophils Absolute: 0 10*3/uL (ref 0.0–0.1)
Basophils Relative: 1 %
Eosinophils Absolute: 0.1 10*3/uL (ref 0.0–0.5)
Eosinophils Relative: 1 %
HCT: 31.4 % — ABNORMAL LOW (ref 36.0–46.0)
Hemoglobin: 9.7 g/dL — ABNORMAL LOW (ref 12.0–15.0)
Immature Granulocytes: 2 %
Lymphocytes Relative: 9 %
Lymphs Abs: 0.8 10*3/uL (ref 0.7–4.0)
MCH: 28.9 pg (ref 26.0–34.0)
MCHC: 30.9 g/dL (ref 30.0–36.0)
MCV: 93.5 fL (ref 80.0–100.0)
Monocytes Absolute: 0.6 10*3/uL (ref 0.1–1.0)
Monocytes Relative: 6 %
Neutro Abs: 7.2 10*3/uL (ref 1.7–7.7)
Neutrophils Relative %: 81 %
Platelets: 406 10*3/uL — ABNORMAL HIGH (ref 150–400)
RBC: 3.36 MIL/uL — ABNORMAL LOW (ref 3.87–5.11)
RDW: 19.7 % — ABNORMAL HIGH (ref 11.5–15.5)
WBC: 8.8 10*3/uL (ref 4.0–10.5)
nRBC: 0 % (ref 0.0–0.2)

## 2022-01-14 LAB — BRAIN NATRIURETIC PEPTIDE: B Natriuretic Peptide: 126 pg/mL — ABNORMAL HIGH (ref 0.0–100.0)

## 2022-01-14 LAB — RESP PANEL BY RT-PCR (FLU A&B, COVID) ARPGX2
Influenza A by PCR: NEGATIVE
Influenza B by PCR: NEGATIVE
SARS Coronavirus 2 by RT PCR: NEGATIVE

## 2022-01-14 LAB — PROCALCITONIN: Procalcitonin: 0.2 ng/mL

## 2022-01-14 MED ORDER — ACETAMINOPHEN 325 MG PO TABS: 650.0000 mg | ORAL_TABLET | Freq: Four times a day (QID) | ORAL | Status: DC | PRN

## 2022-01-14 MED ORDER — SODIUM CHLORIDE 0.9 % IV SOLN
2.0000 g | Freq: Two times a day (BID) | INTRAVENOUS | Status: AC
  Administered 2022-01-14 – 2022-01-19 (×10): 2 g via INTRAVENOUS
  Filled 2022-01-14 (×9): qty 12.5

## 2022-01-14 MED ORDER — LEVOTHYROXINE SODIUM 75 MCG PO TABS
150.0000 ug | ORAL_TABLET | Freq: Every day | ORAL | Status: DC
  Administered 2022-01-15 – 2022-01-20 (×4): 150 ug via ORAL
  Filled 2022-01-14 (×5): qty 2

## 2022-01-14 MED ORDER — SODIUM CHLORIDE 3 % IN NEBU: 4.0000 mL | INHALATION_SOLUTION | RESPIRATORY_TRACT | Status: AC | PRN

## 2022-01-14 MED ORDER — POTASSIUM CHLORIDE 10 MEQ/100ML IV SOLN
10.0000 meq | INTRAVENOUS | Status: AC
  Administered 2022-01-14 – 2022-01-15 (×3): 10 meq via INTRAVENOUS
  Filled 2022-01-14 (×3): qty 100

## 2022-01-14 MED ORDER — MIDODRINE HCL 5 MG PO TABS
5.0000 mg | ORAL_TABLET | Freq: Three times a day (TID) | ORAL | Status: DC
  Administered 2022-01-15: 5 mg
  Filled 2022-01-14: qty 1

## 2022-01-14 MED ORDER — SODIUM CHLORIDE 0.9 % IV SOLN
2.0000 g | Freq: Three times a day (TID) | INTRAVENOUS | Status: DC
  Filled 2022-01-14: qty 12.5

## 2022-01-14 MED ORDER — POLYETHYLENE GLYCOL 3350 17 GM/SCOOP PO POWD: 17.0000 g | Freq: Every day | ORAL | Status: DC | PRN

## 2022-01-14 MED ORDER — IPRATROPIUM-ALBUTEROL 0.5-2.5 (3) MG/3ML IN SOLN
3.0000 mL | Freq: Four times a day (QID) | RESPIRATORY_TRACT | Status: DC
  Administered 2022-01-15 – 2022-01-21 (×28): 3 mL via RESPIRATORY_TRACT
  Filled 2022-01-14 (×28): qty 3

## 2022-01-14 MED ORDER — CITALOPRAM HYDROBROMIDE 20 MG PO TABS
20.0000 mg | ORAL_TABLET | Freq: Every day | ORAL | Status: DC
  Administered 2022-01-14 – 2022-01-21 (×5): 20 mg via ORAL
  Filled 2022-01-14 (×6): qty 1

## 2022-01-14 MED ORDER — CALCITRIOL 0.25 MCG PO CAPS
0.2500 ug | ORAL_CAPSULE | Freq: Every day | ORAL | Status: DC
  Administered 2022-01-14 – 2022-01-21 (×5): 0.25 ug via ORAL
  Filled 2022-01-14 (×6): qty 1

## 2022-01-14 MED ORDER — SODIUM CHLORIDE 0.9 % IV SOLN
2.0000 g | Freq: Once | INTRAVENOUS | Status: AC
  Administered 2022-01-14: 2 g via INTRAVENOUS
  Filled 2022-01-14: qty 20

## 2022-01-14 MED ORDER — SODIUM CHLORIDE 0.9 % IV SOLN
500.0000 mg | INTRAVENOUS | Status: AC
  Administered 2022-01-15 – 2022-01-18 (×5): 500 mg via INTRAVENOUS
  Filled 2022-01-14 (×4): qty 5

## 2022-01-14 MED ORDER — ALBUTEROL SULFATE (2.5 MG/3ML) 0.083% IN NEBU
2.5000 mg | INHALATION_SOLUTION | RESPIRATORY_TRACT | Status: DC | PRN
  Filled 2022-01-14: qty 3

## 2022-01-14 MED ORDER — GABAPENTIN 300 MG PO CAPS: 300.0000 mg | ORAL_CAPSULE | Freq: Three times a day (TID) | ORAL | Status: DC | PRN

## 2022-01-14 MED ORDER — FOLIC ACID 1 MG PO TABS
1.0000 mg | ORAL_TABLET | Freq: Every day | ORAL | Status: DC
  Administered 2022-01-14 – 2022-01-15 (×2): 1 mg
  Filled 2022-01-14 (×2): qty 1

## 2022-01-14 MED ORDER — SODIUM CHLORIDE 0.9 % IV SOLN: INTRAVENOUS | Status: DC

## 2022-01-14 MED ORDER — LEVETIRACETAM 100 MG/ML PO SOLN
750.0000 mg | Freq: Two times a day (BID) | ORAL | Status: DC
  Administered 2022-01-15: 750 mg
  Filled 2022-01-14 (×4): qty 7.5

## 2022-01-14 MED ORDER — METOPROLOL TARTRATE 25 MG PO TABS
12.5000 mg | ORAL_TABLET | Freq: Two times a day (BID) | ORAL | Status: DC
  Administered 2022-01-14 – 2022-01-19 (×7): 12.5 mg via ORAL
  Filled 2022-01-14 (×10): qty 1

## 2022-01-14 MED ORDER — BUDESONIDE 0.5 MG/2ML IN SUSP
0.5000 mg | Freq: Two times a day (BID) | RESPIRATORY_TRACT | Status: DC
  Administered 2022-01-14 – 2022-01-22 (×16): 0.5 mg via RESPIRATORY_TRACT
  Filled 2022-01-14 (×16): qty 2

## 2022-01-14 MED ORDER — APIXABAN 5 MG PO TABS
5.0000 mg | ORAL_TABLET | Freq: Two times a day (BID) | ORAL | Status: DC
  Administered 2022-01-14 – 2022-01-17 (×6): 5 mg via ORAL
  Filled 2022-01-14 (×7): qty 1

## 2022-01-14 MED ORDER — OXYCODONE HCL 5 MG PO TABS: 10.0000 mg | ORAL_TABLET | Freq: Four times a day (QID) | ORAL | Status: DC | PRN

## 2022-01-14 MED ORDER — IPRATROPIUM-ALBUTEROL 0.5-2.5 (3) MG/3ML IN SOLN
3.0000 mL | Freq: Four times a day (QID) | RESPIRATORY_TRACT | Status: DC | PRN
  Administered 2022-01-14: 3 mL via RESPIRATORY_TRACT
  Filled 2022-01-14: qty 3

## 2022-01-14 MED ORDER — VITAMIN B-12 100 MCG PO TABS
500.0000 ug | ORAL_TABLET | Freq: Every day | ORAL | Status: DC
  Administered 2022-01-14 – 2022-01-15 (×2): 500 ug
  Filled 2022-01-14: qty 1
  Filled 2022-01-14: qty 5

## 2022-01-14 NOTE — Assessment & Plan Note (Signed)
Continue levothyroxine 

## 2022-01-14 NOTE — ED Triage Notes (Signed)
Pt to er room number one via ems. Per ems pt is a trach pt who is here for choking, states that when she tries to eat or drink she chokes. States that they found her with increased work of breathing and low O2 sat, stats that they suctioned her trach and pt improved, states that they gave oxygen and her work of breathing improved. Pt has wounds on lower extremities

## 2022-01-14 NOTE — ED Notes (Signed)
Increased oxygen to 100 percent, 13 liters , Saturation 91- 92

## 2022-01-14 NOTE — Assessment & Plan Note (Signed)
-   Continue Pulmicort and DuoNeb - As needed albuterol - Continue to monitor

## 2022-01-14 NOTE — Assessment & Plan Note (Addendum)
-   Continue folic acid and N34 - Hemoglobin stable, recheck CBC in AM

## 2022-01-14 NOTE — Assessment & Plan Note (Signed)
Continue Keppra.

## 2022-01-14 NOTE — Assessment & Plan Note (Addendum)
-   repleted -mag 2.0

## 2022-01-14 NOTE — ED Notes (Addendum)
Patient's boyfriend is at the bedside and voices concerns for the patient to go back home with him. Patient's boyfriend states it is hard for him to care for her. Patient is alert & oriented X3 and states she understands the discharge instructions of clear lqiuids. Boyfriend also voices concerns over oxygen, per patient and boyfriend she has an oxygen concentrator at home that she is supposed to wear and refuses to wear it at times.  Dr. Roderic Palau made aware of boyfriend;s concerns and states he would speak with the patient regarding discharge again.

## 2022-01-14 NOTE — Assessment & Plan Note (Addendum)
-   History of DVT - Continue apixaban

## 2022-01-14 NOTE — ED Notes (Addendum)
Patient's legs unwrapped from gauze at this time. Patient has swelling with scaling and wounds with different healing stages to right leg and skin tear to posterior right leg. Patient also wounds to anterior left leg with granulation.

## 2022-01-14 NOTE — Progress Notes (Signed)
Came in to look at trach for Veronica Horton, RRT while he was tied up in another room.  Patient sat was in upper 70s to low 80s.  Suctioned patient several times and was able to get out a rather large, brown plug and secretions that were white and tan at times.  Sats still showing in low 80s, Robert bagged lavaged patient, suctioned performed again, but not much after that.  Sats came up to 92% on hand-held pulse ox meter with probe on ear.  Hot hands were placed on patient due to cold, mottling on both extremities so pulse ox on hand reading lower than ear.  Patient currently on 13L 98% trach collar.

## 2022-01-14 NOTE — H&P (Signed)
History and Physical    Patient: Veronica Horton INO:[id number] DOB: 1960-07-08 DOA: 01/14/2022 DOS: the patient was seen and examined on 01/14/2022 PCP: [facility]  Patient coming from: SNF  Chief Complaint:  Chief Complaint  Patient presents with   Choking   HPI: Veronica Horton is a 61 y.o. female with medical history significant of aspiration pneumonitis, bronchitis, class III obesity, COPD, DVT, hypertension, seizure, laryngeal cancer status post trach, presents to the ED from facility for vomiting.  Patient was recently discharged from the hospital on September 11.  She has been seen in the hospitals several times recently.  She was in [facility] are with lower extremity edema and pain.  She was discharged home from the ED with clindamycin for which she reports she finished the course.  She continued to have left lower extremity pain and edema.  During her hospitalization here she was started on vancomycin for lower extremity cellulitis.  Patient had a UA that was indicative of UTI and she was started on Rocephin to cover that an aspiration pneumonia.  Her hospitalization was complicated with fluid overload secondary to diastolic CHF for which she was started on IV Lasix and showed improvement.  She had an echo done that showed an ejection fraction of 65-70% as well.  Communication with patient is difficult given that she mouths words at you.  She reports she came in today because she could not breathe.  This started last night.  She has had increase in sputum production.  She denies chest pain.  Apparently was also reported to the ED staff the patient had more nausea and vomiting at the facility.  Patient denies any fevers.  Her only other complaint at this time is chronic back pain.  Patient is DNR, not getting treatment for her laryngeal cancer, and following with palliative. Review of Systems: As mentioned in the history of present illness. All other systems reviewed and  are negative. Past Medical History:  Diagnosis Date   Aspiration pneumonitis ([place]) 01/17/2019   Bronchitis    Class 3 obesity ([place]) 01/23/2020   COPD (chronic obstructive pulmonary disease) (HCC)    Degenerative disc disease, lumbar    DVT (deep venous thrombosis) ([place]) 08/08/2021   Hiatal hernia    Hypertension    Seizure ([place]) 11/28/2020   Small bowel obstruction ([place]) 01/13/2019   Throat cancer [facility])    Past Surgical History:  Procedure Laterality Date   BIOPSY  04/12/2020   Procedure: BIOPSY;  Surgeon: Wilford Corner, MD;  Location: [facility];  Service: Gastroenterology;;   CHOLECYSTECTOMY     degenerative bone disease     DIRECT LARYNGOSCOPY N/A 03/13/2020   Procedure: DIRECT LARYNGOSCOPY WITH BIOPSY;  Surgeon: Jason Coop, DO;  Location: [facility];  Service: ENT;  Laterality: N/A;   FLEXIBLE SIGMOIDOSCOPY N/A 04/12/2020   Procedure: Beryle Quant;  Surgeon: Wilford Corner, MD;  Location: [facility];  Service: Gastroenterology;  Laterality: N/A;   INCISIONAL HERNIA REPAIR N/A 01/15/2019   Procedure: [name] HERNIORRHAPHY WITH MESH;  Surgeon: [name], MD;  Location: [facility] ORS;  Service: General;  Laterality: N/A;   IR GASTROSTOMY TUBE MOD SED  03/22/2020   IR [place] TUBE CHANGE  07/24/2020   IR RADIOLOGIST EVAL & MGMT  05/07/2020   IR REPLACE G-TUBE SIMPLE WO FLUORO  11/30/2020   OMENTECTOMY N/A 01/15/2019   Procedure: OMENTECTOMY;  Surgeon: [name], MD;  Location: [facility];  Service: General;  Laterality: N/A;  POLYPECTOMY  04/12/2020   Procedure: POLYPECTOMY;  Surgeon: Wilford Corner, MD;  Location: [facility];  Service: Gastroenterology;;   TRACHEOSTOMY TUBE PLACEMENT N/A 03/13/2020   Procedure: AWAKE TRACHEOSTOMY;  Surgeon: Jason Coop, DO;  Location: [place];  Service: ENT;  Laterality: N/A;   Social History:  reports that she has been smoking cigarettes. She has been smoking an average of 1 pack per day. She has never used smokeless  tobacco. She reports that she does not drink alcohol and does not use drugs.  Allergies  Allergen Reactions   Codeine Hives and Itching    Reports itching only per RN   Doxycycline Hives   Penicillins Hives    Did it involve swelling of the face/tongue/throat, SOB, or low BP? No Did it involve sudden or severe rash/hives, skin peeling, or any reaction on the inside of your mouth or nose? Yes Did you need to seek medical attention at a hospital or doctor's office? Unknown When did it last happen?      Over 10 years If all above answers are "NO", may proceed with cephalosporin use.     Family History  Problem Relation Age of Onset   Hypertension Mother    Sudden Cardiac Death Neg Hx     Prior to Admission medications   Medication Sig Start Date End Date Taking? Authorizing Provider  apixaban (ELIQUIS) 5 MG TABS tablet Take 1 tablet (5 mg total) by mouth 2 (two) times daily. Restart taking only after next Monday(8/22) 12/20/20   Shelly Coss, MD  budesonide (PULMICORT) 0.5 MG/2ML nebulizer solution Take 2 mLs (0.5 mg total) by nebulization 2 (two) times daily. 04/16/20   Pokhrel, Corrie Mckusick, MD  calcitRIOL (ROCALTROL) 0.25 MCG capsule Take by mouth. 11/16/21   [provider]  cefdinir (OMNICEF) 300 MG capsule Take 1 capsule (300 mg total) by mouth every 12 (twelve) hours. 01/09/22   Orson Eva, MD  clindamycin (CLEOCIN) 300 MG capsule Take 1 capsule (300 mg total) by mouth every 6 (six) hours. 01/09/22   Orson Eva, MD  D3 SUPER STRENGTH 50 MCG (2000 UT) CAPS Take 1 capsule by mouth daily. 11/16/21   [provider]  escitalopram (LEXAPRO) 5 MG tablet Place 1 tablet (5 mg total) into feeding tube daily. 12/13/20   Shelly Coss, MD  folic acid (FOLVITE) 1 MG tablet Place 1 tablet (1 mg total) into feeding tube daily. 12/13/20   Shelly Coss, MD  furosemide (LASIX) 40 MG tablet Take 1 tablet (40 mg total) by mouth 2 (two) times daily. 01/09/22   Orson Eva, MD  gabapentin  (NEURONTIN) 300 MG capsule Place 2 capsules (600 mg total) into feeding tube at bedtime. Patient taking differently: Place 300 mg into feeding tube 3 (three) times daily as needed (nerve pain). 12/13/20   Shelly Coss, MD  ipratropium-albuterol (DUONEB) 0.5-2.5 (3) MG/3ML SOLN Take 3 mLs by nebulization every 6 (six) hours as needed. 04/16/20 01/03/22  Pokhrel, Corrie Mckusick, MD  levETIRAcetam (KEPPRA) 100 MG/ML solution Place 7.5 mLs (750 mg total) into feeding tube 2 (two) times daily. 12/13/20   Shelly Coss, MD  levothyroxine (SYNTHROID) 150 MCG tablet Take 150 mcg by mouth daily. 08/08/21   [provider]  lidocaine (LIDODERM) 5 % Place 1 patch onto the skin daily. Remove & Discard patch within 12 hours or as directed by MD 08/09/20   Benay Pike, MD  magic mouthwash w/lidocaine SOLN Take 10 mLs by mouth 3 (three) times daily as needed for mouth pain.  SWISH AND SPIT. DO NOT SWALLOW. 08/13/21   Johnson, Clanford L, MD  metoprolol tartrate (LOPRESSOR) 25 MG tablet Take 0.5 tablets (12.5 mg total) by mouth 2 (two) times daily. 01/09/22   Orson Eva, MD  midodrine (PROAMATINE) 5 MG tablet Place 1 tablet (5 mg total) into feeding tube 3 (three) times daily with meals. 01/09/22   Orson Eva, MD  Nystatin (GERHARDT'S BUTT CREAM) CREA Apply 1 application. topically 3 (three) times daily as needed for irritation. 08/13/21   Johnson, Clanford L, MD  polyethylene glycol powder (GLYCOLAX/MIRALAX) 17 GM/SCOOP powder Dissolve 1 capful (17 g) in liquid and add into feeding tube daily as needed for moderate constipation. 12/13/20   Shelly Coss, MD  potassium chloride 20 MEQ/15ML (10%) SOLN Take 15 mLs by mouth daily. 07/28/21   [provider]  VENTOLIN HFA 108 (90 Base) MCG/ACT inhaler Inhale 1 puff into the lungs every 4 (four) hours as needed for wheezing or shortness of breath. 12/01/21   [provider]  vitamin B-12 (CYANOCOBALAMIN) 1000 MCG tablet Place 0.5 tablets (500 mcg total) into  feeding tube daily. 12/13/20   Shelly Coss, MD    Physical Exam: Vitals:   01/14/22 2130 01/14/22 2215 01/14/22 2235 01/14/22 2238  BP: (!) 120/51 (!) 112/59    Pulse: 85 70    Resp: 19 17    Temp:  98.4 F (36.9 C)    TempSrc:  Oral    SpO2: 91% 97% 95% 95%  Weight:      Height:       1.  General: Patient lying supine in bed, head of bed elevated  2. Psychiatric: Alert and oriented x 3, mood and behavior normal for situation, pleasant and cooperative with exam   3. Neurologic: Speech is at baseline, face is symmetric, moves all 4 extremities voluntarily, at baseline without acute deficits on limited exam   4. HEENMT:  Head is atraumatic, normocephalic, pupils reactive to light, neck is supple, trachea is midline with trach collar in place, mucous membranes are moist   5. Respiratory : Lungs are rhonchorous, currently being suctioned with thick yellow mucus and brown mucous plug, actively coughing, no increased work of breathing, having a difficult time picking up oxygen saturations with pulse ox   6. Cardiovascular : Heart rate normal, rhythm is regular, no murmurs, rubs or gallops, significant peripheral edema with venous stasis changes, peripheral pulses palpated   7. Gastrointestinal:  Abdomen is soft, nondistended, nontender to palpation bowel sounds active, no masses or organomegaly palpated   8. Skin:  Skin is warm, dry and intact without rashes, acute lesions, or ulcers on limited exam   9.Musculoskeletal:  No acute deformities or trauma, no asymmetry in tone, peripheral edema present, peripheral pulses palpated, no tenderness to palpation in the extremities  Data Reviewed: In the ED Temp 98.4, heart rate 61-70, respiratory rate 12-16, blood pressure 104/59-114/91, satting 82% on 8 L, 95% on 13 L CBC shows a thrombocytosis of 406 Patient is a hypokalemia at 3.4 AKI with a BUN 25 and creatinine 2.44 Chest x-ray shows persistent retrocardiac airspace opacity  with fluid evaluation due to patient rotation, chronic blunting of costophrenic angles EKG shows a heart rate of 66, sinus rhythm, QTc 450 Rocephin started in the ED Admission requested for acute respiratory failure with hypoxia  Assessment and Plan: Hypokalemia - Potassium 3.4 - Likely secondary to Lasix - Replace and recheck in the a.m.  Chronic anemia - Continue folic acid and Z61 -  Hemoglobin stable  Thyroid disease - Continue levothyroxine  Acute respiratory failure with hypoxia (HCC) - Discharged on 6-8 L via trach - Hypoxic in the low 80s on home oxygen - Requiring 13 L to maintain oxygen sats - Multifactorial with mucous plugging, pneumonia, COPD - No evidence of pulmonary edema on chest x-ray -BNP 126, down from previous - Low suspicion for PE given that patient is on Eliquis, no missed doses -Wean down O2 as tolerated - Continue breathing treatments - Hypertonic neb to thin secretions -ABG pending - Continue to monitor   DVT (deep venous thrombosis) (HCC) - History of DVT - Continue Eliquis  Seizure disorder (HCC) - Continue Keppra  Pneumonia - Retrocardiac opacity on chest x-ray - Thrombocytosis of 406 - Acute respiratory failure with hypoxia - Excessive sputum production - Hypertonic neb to thin secretions - Patient recently in the hospital and discharged on cefdinir and clindamycin so will cover with broad-spectrum antibiotics including cefepime and Zithromax - Expectorated sputum culture - Blood cultures - Urine Legionella and strep antigens - Continue to monitor  COPD (chronic obstructive pulmonary disease) (HCC) - Continue Pulmicort and DuoNeb - As needed albuterol - Continue to monitor      Advance Care Planning:   Code Status: DNR   Consults: None  Family Communication: No family at bedside  Severity of Illness: The appropriate patient status for this patient is INPATIENT. Inpatient status is judged to be reasonable and necessary in  order to provide the required intensity of service to ensure the patient's safety. The patient's presenting symptoms, physical exam findings, and initial radiographic and laboratory data in the context of their chronic comorbidities is felt to place them at high risk for further clinical deterioration. Furthermore, it is not anticipated that the patient will be medically stable for discharge from the hospital within 2 midnights of admission.   * I certify that at the point of admission it is my clinical judgment that the patient will require inpatient hospital care spanning beyond 2 midnights from the point of admission due to high intensity of service, high risk for further deterioration and high frequency of surveillance required.*  Author: Rolla Plate, DO 01/14/2022 11:03 PM  For on call review [email or web address].

## 2022-01-14 NOTE — Assessment & Plan Note (Addendum)
-   Retrocardiac opacity on chest x-ray - Hypertonic neb to thin secretions - continue broad-spectrum antibiotics including cefepime and Zithromax - Expectorated sputum culture - Blood cultures--neg - Urine Legionella and strep antigens - Continue to monitor

## 2022-01-14 NOTE — ED Provider Notes (Signed)
[facility] EMERGENCY DEPARTMENT Provider Note   CSN: [id number] Arrival date & time: 01/14/22  1702     History {Add pertinent medical, surgical, social history, OB history to HPI:1} Chief Complaint  Patient presents with   Choking    Veronica Horton is a 61 y.o. female.  Patient has a history of laryngeal cancer.  She has a trach.  She has been having difficulties this week swallowing.  Patient states she has no problems swallowing liquids and soft foods.  She was choking earlier today but is swallowing liquids fine now.  Patient was just in the hospital with pneumonitis and she has a history of laryngeal cancer which she has decided not to do anything about.  She is a DNR and palliative care has been arranged when she was discharged a few days ago.  Patient is on 8 L at home but has been hypoxic   Weakness      Home Medications Prior to Admission medications   Medication Sig Start Date End Date Taking? Authorizing Provider  apixaban (ELIQUIS) 5 MG TABS tablet Take 1 tablet (5 mg total) by mouth 2 (two) times daily. Restart taking only after next Monday(8/22) 12/20/20   Shelly Coss, MD  budesonide (PULMICORT) 0.5 MG/2ML nebulizer solution Take 2 mLs (0.5 mg total) by nebulization 2 (two) times daily. 04/16/20   Pokhrel, Corrie Mckusick, MD  calcitRIOL (ROCALTROL) 0.25 MCG capsule Take by mouth. 11/16/21   [provider]  cefdinir (OMNICEF) 300 MG capsule Take 1 capsule (300 mg total) by mouth every 12 (twelve) hours. 01/09/22   Orson Eva, MD  clindamycin (CLEOCIN) 300 MG capsule Take 1 capsule (300 mg total) by mouth every 6 (six) hours. 01/09/22   Orson Eva, MD  D3 SUPER STRENGTH 50 MCG (2000 UT) CAPS Take 1 capsule by mouth daily. 11/16/21   [provider]  escitalopram (LEXAPRO) 5 MG tablet Place 1 tablet (5 mg total) into feeding tube daily. 12/13/20   Shelly Coss, MD  folic acid (FOLVITE) 1 MG tablet Place 1 tablet (1 mg total) into feeding tube daily. 12/13/20    Shelly Coss, MD  furosemide (LASIX) 40 MG tablet Take 1 tablet (40 mg total) by mouth 2 (two) times daily. 01/09/22   Orson Eva, MD  gabapentin (NEURONTIN) 300 MG capsule Place 2 capsules (600 mg total) into feeding tube at bedtime. Patient taking differently: Place 300 mg into feeding tube 3 (three) times daily as needed (nerve pain). 12/13/20   Shelly Coss, MD  ipratropium-albuterol (DUONEB) 0.5-2.5 (3) MG/3ML SOLN Take 3 mLs by nebulization every 6 (six) hours as needed. 04/16/20 01/03/22  Pokhrel, Corrie Mckusick, MD  levETIRAcetam (KEPPRA) 100 MG/ML solution Place 7.5 mLs (750 mg total) into feeding tube 2 (two) times daily. 12/13/20   Shelly Coss, MD  levothyroxine (SYNTHROID) 150 MCG tablet Take 150 mcg by mouth daily. 08/08/21   [provider]  lidocaine (LIDODERM) 5 % Place 1 patch onto the skin daily. Remove & Discard patch within 12 hours or as directed by MD 08/09/20   Benay Pike, MD  magic mouthwash w/lidocaine SOLN Take 10 mLs by mouth 3 (three) times daily as needed for mouth pain. SWISH AND SPIT. DO NOT SWALLOW. 08/13/21   Johnson, Clanford L, MD  metoprolol tartrate (LOPRESSOR) 25 MG tablet Take 0.5 tablets (12.5 mg total) by mouth 2 (two) times daily. 01/09/22   Orson Eva, MD  midodrine (PROAMATINE) 5 MG tablet Place 1 tablet (5 mg total) into feeding tube 3 (  three) times daily with meals. 01/09/22   Orson Eva, MD  Nystatin (GERHARDT'S BUTT CREAM) CREA Apply 1 application. topically 3 (three) times daily as needed for irritation. 08/13/21   Johnson, Clanford L, MD  polyethylene glycol powder (GLYCOLAX/MIRALAX) 17 GM/SCOOP powder Dissolve 1 capful (17 g) in liquid and add into feeding tube daily as needed for moderate constipation. 12/13/20   Shelly Coss, MD  potassium chloride 20 MEQ/15ML (10%) SOLN Take 15 mLs by mouth daily. 07/28/21   [provider]  VENTOLIN HFA 108 (90 Base) MCG/ACT inhaler Inhale 1 puff into the lungs every 4 (four) hours as needed for  wheezing or shortness of breath. 12/01/21   [provider]  vitamin B-12 (CYANOCOBALAMIN) 1000 MCG tablet Place 0.5 tablets (500 mcg total) into feeding tube daily. 12/13/20   Shelly Coss, MD      Allergies    Codeine, Doxycycline, and Penicillins    Review of Systems   Review of Systems  Neurological:  Positive for weakness.    Physical Exam Updated Vital Signs BP (!) 107/59 (BP Location: Left Wrist)   Pulse 62   Temp 98.4 F (36.9 C) (Oral)   Resp 16   Ht 5' 5.5" (1.664 m)   Wt 115.7 kg   SpO2 95%   BMI 41.79 kg/m  Physical Exam  ED Results / Procedures / Treatments   Labs (all labs ordered are listed, but only abnormal results are displayed) Labs Reviewed - No data to display  EKG None  Radiology No results found.  Procedures Procedures  {Document cardiac monitor, telemetry assessment procedure when appropriate:1}  Medications Ordered in ED Medications - No data to display  ED Course/ Medical Decision Making/ A&P                           Medical Decision Making Amount and/or Complexity of Data Reviewed Labs: ordered. Radiology: ordered.  Patient with laryngeal cancer and palliative care.  She also has respiratory failure and pneumonitis from aspiration.  Patient unable to keep her sats above 80% on the 8 L she has at home.  She will be admitted for continued increased oxygen need  {Document critical care time when appropriate:1} {Document review of labs and clinical decision tools ie heart score, Chads2Vasc2 etc:1}  {Document your independent review of radiology images, and any outside records:1} {Document your discussion with family members, caretakers, and with consultants:1} {Document social determinants of health affecting pt's care:1} {Document your decision making why or why not admission, treatments were needed:1} Final Clinical Impression(s) / ED Diagnoses Final diagnoses:  Dysphagia, unspecified type    Rx / DC Orders ED  Discharge Orders     None

## 2022-01-14 NOTE — ED Notes (Signed)
Pt able to tolerate PO Meds crushed and mixed with applesauce.

## 2022-01-14 NOTE — ED Notes (Signed)
Phlebotomy at bedside collecting Blood Cultures at this time

## 2022-01-14 NOTE — ED Notes (Signed)
Per Dr. Roderic Palau patient is not choking and is at her baseline. Patient has patent airway with normal respiratory effort. Dr. Roderic Palau educated patient on a liquid diet prior to discharge and patient verbalized understanding.

## 2022-01-14 NOTE — Discharge Instructions (Addendum)
-  Transfer to residential hospice house

## 2022-01-14 NOTE — Assessment & Plan Note (Addendum)
-   recently discharged on 6-8 L via trach from OSH - Presented hypoxic in the low 80s on home oxygen - Required up to 13 L to maintain oxygen sats on admission - Multifactorial with mucous plugging, pneumonia, COPD - New evidence of pulmonary edema on chest x-ray 9/18 - continue IV lasix>>stopped with transition to comfort measures - Low suspicion for PE given that patient is on Eliquis, no missed doses -Wean down O2 as tolerated - Continue breathing treatments - Hypertonic neb to thin secretions initially - Continue to monitor

## 2022-01-15 DIAGNOSIS — N179 Acute kidney failure, unspecified: Secondary | ICD-10-CM | POA: Diagnosis not present

## 2022-01-15 DIAGNOSIS — I824Y9 Acute embolism and thrombosis of unspecified deep veins of unspecified proximal lower extremity: Secondary | ICD-10-CM | POA: Diagnosis not present

## 2022-01-15 DIAGNOSIS — D649 Anemia, unspecified: Secondary | ICD-10-CM | POA: Diagnosis not present

## 2022-01-15 DIAGNOSIS — J449 Chronic obstructive pulmonary disease, unspecified: Secondary | ICD-10-CM | POA: Diagnosis not present

## 2022-01-15 LAB — MRSA NEXT GEN BY PCR, NASAL: MRSA by PCR Next Gen: DETECTED — AB

## 2022-01-15 LAB — COMPREHENSIVE METABOLIC PANEL
ALT: 17 U/L (ref 0–44)
AST: 15 U/L (ref 15–41)
Albumin: 2.5 g/dL — ABNORMAL LOW (ref 3.5–5.0)
Alkaline Phosphatase: 88 U/L (ref 38–126)
Anion gap: 10 (ref 5–15)
BUN: 24 mg/dL — ABNORMAL HIGH (ref 8–23)
CO2: 27 mmol/L (ref 22–32)
Calcium: 8.1 mg/dL — ABNORMAL LOW (ref 8.9–10.3)
Chloride: 100 mmol/L (ref 98–111)
Creatinine, Ser: 2.21 mg/dL — ABNORMAL HIGH (ref 0.44–1.00)
GFR, Estimated: 25 mL/min — ABNORMAL LOW (ref >60)
Glucose, Bld: 92 mg/dL (ref 70–99)
Potassium: 3.9 mmol/L (ref 3.5–5.1)
Sodium: 137 mmol/L (ref 135–145)
Total Bilirubin: 0.6 mg/dL (ref 0.3–1.2)
Total Protein: 6.4 g/dL — ABNORMAL LOW (ref 6.5–8.1)

## 2022-01-15 LAB — CBC WITH DIFFERENTIAL/PLATELET
Abs Immature Granulocytes: 0.09 10*3/uL — ABNORMAL HIGH (ref 0.00–0.07)
Basophils Absolute: 0.1 10*3/uL (ref 0.0–0.1)
Basophils Relative: 1 %
Eosinophils Absolute: 0.1 10*3/uL (ref 0.0–0.5)
Eosinophils Relative: 1 %
HCT: 30.3 % — ABNORMAL LOW (ref 36.0–46.0)
Hemoglobin: 9 g/dL — ABNORMAL LOW (ref 12.0–15.0)
Immature Granulocytes: 1 %
Lymphocytes Relative: 11 %
Lymphs Abs: 1 10*3/uL (ref 0.7–4.0)
MCH: 28.3 pg (ref 26.0–34.0)
MCHC: 29.7 g/dL — ABNORMAL LOW (ref 30.0–36.0)
MCV: 95.3 fL (ref 80.0–100.0)
Monocytes Absolute: 0.6 10*3/uL (ref 0.1–1.0)
Monocytes Relative: 7 %
Neutro Abs: 7.1 10*3/uL (ref 1.7–7.7)
Neutrophils Relative %: 79 %
Platelets: 366 10*3/uL (ref 150–400)
RBC: 3.18 MIL/uL — ABNORMAL LOW (ref 3.87–5.11)
RDW: 19.5 % — ABNORMAL HIGH (ref 11.5–15.5)
WBC: 8.9 10*3/uL (ref 4.0–10.5)
nRBC: 0 % (ref 0.0–0.2)

## 2022-01-15 LAB — MAGNESIUM: Magnesium: 2.2 mg/dL (ref 1.7–2.4)

## 2022-01-15 LAB — PROCALCITONIN: Procalcitonin: 0.14 ng/mL

## 2022-01-15 LAB — HIV ANTIBODY (ROUTINE TESTING W REFLEX): HIV Screen 4th Generation wRfx: NONREACTIVE

## 2022-01-15 MED ORDER — LEVETIRACETAM 100 MG/ML PO SOLN
750.0000 mg | Freq: Two times a day (BID) | ORAL | Status: DC
  Administered 2022-01-15 – 2022-01-17 (×5): 750 mg via ORAL
  Filled 2022-01-15 (×11): qty 7.5

## 2022-01-15 MED ORDER — POLYETHYLENE GLYCOL 3350 17 GM/SCOOP PO POWD: 17.0000 g | Freq: Every day | ORAL | Status: DC | PRN

## 2022-01-15 MED ORDER — FUROSEMIDE 40 MG PO TABS
40.0000 mg | ORAL_TABLET | Freq: Two times a day (BID) | ORAL | Status: DC
  Administered 2022-01-15 – 2022-01-17 (×4): 40 mg via ORAL
  Filled 2022-01-15 (×5): qty 1

## 2022-01-15 MED ORDER — VANCOMYCIN HCL 1250 MG/250ML IV SOLN
1250.0000 mg | INTRAVENOUS | Status: DC
  Administered 2022-01-17: 1250 mg via INTRAVENOUS
  Filled 2022-01-15: qty 250

## 2022-01-15 MED ORDER — MUPIROCIN 2 % EX OINT
1.0000 | TOPICAL_OINTMENT | Freq: Two times a day (BID) | CUTANEOUS | Status: AC
  Administered 2022-01-15 – 2022-01-19 (×9): 1 via NASAL
  Filled 2022-01-15: qty 22

## 2022-01-15 MED ORDER — CHLORHEXIDINE GLUCONATE CLOTH 2 % EX PADS
6.0000 | MEDICATED_PAD | Freq: Every day | CUTANEOUS | Status: AC
  Administered 2022-01-15 – 2022-01-18 (×5): 6 via TOPICAL

## 2022-01-15 MED ORDER — MIDODRINE HCL 5 MG PO TABS
5.0000 mg | ORAL_TABLET | Freq: Three times a day (TID) | ORAL | Status: DC
  Administered 2022-01-15 – 2022-01-21 (×12): 5 mg via ORAL
  Filled 2022-01-15 (×17): qty 1

## 2022-01-15 MED ORDER — OXYCODONE HCL 5 MG PO TABS
10.0000 mg | ORAL_TABLET | Freq: Four times a day (QID) | ORAL | Status: DC | PRN
  Administered 2022-01-15 – 2022-01-20 (×9): 10 mg via ORAL
  Filled 2022-01-15 (×9): qty 2

## 2022-01-15 MED ORDER — GABAPENTIN 300 MG PO CAPS: 300.0000 mg | ORAL_CAPSULE | Freq: Three times a day (TID) | ORAL | Status: DC | PRN

## 2022-01-15 MED ORDER — VANCOMYCIN HCL 2000 MG/400ML IV SOLN
2000.0000 mg | Freq: Once | INTRAVENOUS | Status: AC
  Administered 2022-01-15: 2000 mg via INTRAVENOUS
  Filled 2022-01-15: qty 400

## 2022-01-15 MED ORDER — FOLIC ACID 1 MG PO TABS
1.0000 mg | ORAL_TABLET | Freq: Every day | ORAL | Status: DC
  Administered 2022-01-17 – 2022-01-21 (×3): 1 mg via ORAL
  Filled 2022-01-15 (×4): qty 1

## 2022-01-15 MED ORDER — VITAMIN B-12 100 MCG PO TABS
500.0000 ug | ORAL_TABLET | Freq: Every day | ORAL | Status: DC
  Administered 2022-01-17 – 2022-01-21 (×3): 500 ug via ORAL
  Filled 2022-01-15 (×4): qty 5

## 2022-01-15 NOTE — Assessment & Plan Note (Signed)
--  stable, following

## 2022-01-15 NOTE — Progress Notes (Signed)
Pharmacy Antibiotic Note  Veronica Horton is a 61 y.o. female admitted on 01/14/2022 with pneumonia.  Pharmacy has been consulted for vancomcyin dosing. She has a history of laryngeal cancer status post trach, presents to the ED from facility for vomiting.  Patient was recently discharged from the hospital on September 11. She was discharged with clindamycin for which she reports she finished the course.  Plan: Vancomcyin '2000mg'$  IV once then '1250mg'$  IV q48 hours ([id number]) Consider random vancomycin level 9/18 evening  Height: 5' 5.5" (166.4 cm) Weight: 115.7 kg (255 lb) IBW/kg (Calculated) : 58.15  Temp (24hrs), Avg:98.3 F (36.8 C), Min:98.2 F (36.8 C), Max:98.4 F (36.9 C)  Recent Labs  Lab 01/08/22 0511 01/09/22 0501 01/14/22 2006  WBC  --   --  8.8  CREATININE 1.44* 1.44* 2.44*    Estimated Creatinine Clearance: 30.6 mL/min (A) (by C-G formula based on SCr of 2.44 mg/dL (H)).    Allergies  Allergen Reactions   Codeine Hives and Itching    Reports itching only per RN   Doxycycline Hives   Penicillins Hives    Did it involve swelling of the face/tongue/throat, SOB, or low BP? No Did it involve sudden or severe rash/hives, skin peeling, or any reaction on the inside of your mouth or nose? Yes Did you need to seek medical attention at a hospital or doctor's office? Unknown When did it last happen?      Over 10 years If all above answers are "NO", may proceed with cephalosporin use.     Antimicrobials this admission: Vancomycin 9/17>> Cefepime 9/16  Microbiology results: BCx: pending MRSA PCR: +  Thank you for allowing pharmacy to be a part of this patient's care.  Veronica Horton 01/15/2022 1:35 AM

## 2022-01-15 NOTE — Hospital Course (Addendum)
61 year old female with a history of COPD, tobacco abuse, chronic respiratory failure with tracheostomy on 6-8 L at home, laryngeal cancer status post tracheostomy, seizure disorder, GERD, hypertension, orthostatic hypotension presenting with generalized weakness and increasing left lower extremity redness and pain.  The patient visited the emergency department at [facility] on 12/27/2026 with lower extremity edema and pain.  The patient was discharged home from the ED with clindamycin with which she had been compliant.  She continued to have left lower extremity pain and edema.  She stated that she had her left leg against the wall approximately 1 week prior to this admission.  She denies any other recent injuries or falls.  She uses a walker at baseline.  She continues to smoke 1/2 pack/day Although the patient did not formally leave [facility], she aggressively advocated for discharge on 01/09/2022. On 01/10/2022, the patient slid out of bed and went to the ED at [facility].  She refused any blood work and was subsequently discharged home. She presented again on 01/14/2022 with shortness of breath with nausea and vomiting.  Chest x-ray showed increased interstitial markings with moderate left pleural effusion and small right pleural effusion.  The patient was started on Lasix.  She was also started on vancomycin and cefepime for pneumonia.  Since admission to the hospital, the patient's condition has waxed and waned without much improvement.  Her mentation also has been waxing and waning.  Palliative medicine was consulted.  Pulmonary medicine was consulted.  She was started antibiotics and lasix.  Although she remained afebrile and hemodynamically stable, her overall medical condition continued to deteriorate with decline in mental status and refusing po intake.  [place] discussions were held with sister and boyfriend.  They abdicated further decision making to patient's son.  [name] was contacted and favors a  more comfort focus approach.  Hospital stay prolonged due to inability to find accepting residential hospice facility.  After speaking with son, he was agreeable for SNF placement with hospice services via [facility].

## 2022-01-15 NOTE — Progress Notes (Signed)
PROGRESS NOTE   Veronica Horton  ZHY:[id number] DOB: 1961/01/09 DOA: 01/14/2022 PCP: [facility]   Chief Complaint  Patient presents with   Choking   Level of care: Stepdown  Brief Admission History:  61 y.o. female with medical history significant of aspiration pneumonitis, bronchitis, class III obesity, COPD, DVT, hypertension, seizure, laryngeal cancer status post trach, presents to the ED from facility for vomiting.  Patient was recently discharged from the hospital on September 11.  She has been seen in the hospitals several times recently.  She was in [facility] are with lower extremity edema and pain.  She was discharged home from the ED with clindamycin for which she reports she finished the course.  She continued to have left lower extremity pain and edema.  During her hospitalization here she was started on vancomycin for lower extremity cellulitis.  Patient had a UA that was indicative of UTI and she was started on Rocephin to cover that an aspiration pneumonia.  Her hospitalization was complicated with fluid overload secondary to diastolic CHF for which she was started on IV Lasix and showed improvement.  She had an echo done that showed an ejection fraction of 65-70% as well.  Communication with patient is difficult given that she mouths words at you.  She reports she came in today because she could not breathe.  This started last night.  She has had increase in sputum production.  She denies chest pain.  Apparently was also reported to the ED staff the patient had more nausea and vomiting at the facility.  Patient denies any fevers.  Her only other complaint at this time is chronic back pain.   Patient is DNR, not getting treatment for her laryngeal cancer, and following with palliative.   Assessment and Plan: Chronic anemia - Continue folic acid and E95 - Hemoglobin stable  Thyroid disease - Continue levothyroxine  Acute respiratory failure with hypoxia  (HCC) - Discharged on 6-8 L via trach - Hypoxic in the low 80s on home oxygen - Requiring 13 L to maintain oxygen sats - Multifactorial with mucous plugging, pneumonia, COPD - No evidence of pulmonary edema on chest x-ray -BNP 126, down from previous - Low suspicion for PE given that patient is on Eliquis, no missed doses -Wean down O2 as tolerated - Continue breathing treatments - Hypertonic neb to thin secretions - Continue to monitor   Hypokalemia - repleted  Acute renal failure superimposed on stage 3b chronic kidney disease (HCC) --stable, following  DVT (deep venous thrombosis) (HCC) - History of DVT - Continue apixaban  Seizure disorder (HCC) - Continue Keppra  Pneumonia - Retrocardiac opacity on chest x-ray - Thrombocytosis of 406 - Acute respiratory failure with hypoxia - Excessive sputum production - Hypertonic neb to thin secretions - continue broad-spectrum antibiotics including cefepime and Zithromax - Expectorated sputum culture - Blood cultures - Urine Legionella and strep antigens - Continue to monitor  COPD (chronic obstructive pulmonary disease) (HCC) - Continue Pulmicort and DuoNeb - As needed albuterol - Continue to monitor   DVT prophylaxis: apixaban Code Status: DNR  Family Communication:  Disposition: Status is: Inpatient Remains inpatient appropriate because: intensity    Consultants:  Palliative  Procedures:   Antimicrobials:  Cefepime/azithromycin  Subjective: Pt resting comfortably, no SOB with higher oxygen delivery  Objective: Vitals:   01/15/22 0500 01/15/22 0600 01/15/22 0700 01/15/22 0747  BP: (!) 112/57 108/60    Pulse: 61 (!) 59    Resp: 13  13    Temp:   97.9 F (36.6 C)   TempSrc:   Oral   SpO2: 98% 96% 100% 100%  Weight:      Height:        Intake/Output Summary (Last 24 hours) at 01/15/2022 1140 Last data filed at 01/15/2022 0600 Gross per 24 hour  Intake 1437.77 ml  Output --  Net 1437.77 ml   Filed  Weights   01/14/22 1706  Weight: 115.7 kg   Examination:  General exam: chronically ill appearing female on trach collar, Appears calm and comfortable  Respiratory system: rales heard moderate increased work of breathing. Cardiovascular system: normal S1 & S2 heard. No JVD, murmurs, rubs, gallops or clicks. No pedal edema. Gastrointestinal system: Abdomen is nondistended, soft and nontender. No organomegaly or masses felt. Normal bowel sounds heard. Central nervous system: Alert and oriented. No focal neurological deficits. Extremities: Symmetric 5 x 5 power. Skin: No rashes, lesions or ulcers. Psychiatry: Judgement and insight UTD. Mood & affect appropriate.   Data Reviewed: I have personally reviewed following labs and imaging studies  CBC: Recent Labs  Lab 01/14/22 2006 01/15/22 0415  WBC 8.8 8.9  NEUTROABS 7.2 7.1  HGB 9.7* 9.0*  HCT 31.4* 30.3*  MCV 93.5 95.3  PLT 406* 160    Basic Metabolic Panel: Recent Labs  Lab 01/09/22 0501 01/14/22 2006 01/15/22 0415  NA 139 139 137  K 3.0* 3.4* 3.9  CL 102 99 100  CO2 '28 30 27  '$ GLUCOSE 87 94 92  BUN 16 25* 24*  CREATININE 1.44* 2.44* 2.21*  CALCIUM 8.2* 8.7* 8.1*  MG 1.7  --  2.2    CBG: No results for input(s): "GLUCAP" in the last 168 hours.  Recent Results (from the past 240 hour(s))  Resp Panel by RT-PCR (Flu A&B, Covid) Anterior Nasal Swab     Status: None   Collection Time: 01/14/22  9:03 PM   Specimen: Anterior Nasal Swab  Result Value Ref Range Status   SARS Coronavirus 2 by RT PCR NEGATIVE NEGATIVE Final    Comment: (NOTE) SARS-CoV-2 target nucleic acids are NOT DETECTED.  The SARS-CoV-2 RNA is generally detectable in upper respiratory specimens during the acute phase of infection. The lowest concentration of SARS-CoV-2 viral copies this assay can detect is 138 copies/mL. A negative result does not preclude SARS-Cov-2 infection and should not be used as the sole basis for treatment or other patient  management decisions. A negative result may occur with  improper specimen collection/handling, submission of specimen other than nasopharyngeal swab, presence of viral mutation(s) within the areas targeted by this assay, and inadequate number of viral copies(<138 copies/mL). A negative result must be combined with clinical observations, patient history, and epidemiological information. The expected result is Negative.  Fact Sheet for Patients:  EntrepreneurPulse.com.au  Fact Sheet for Healthcare Providers:  IncredibleEmployment.be  This test is no t yet approved or cleared by the [place] FDA and  has been authorized for detection and/or diagnosis of SARS-CoV-2 by FDA under an Emergency Use Authorization (EUA). This EUA will remain  in effect (meaning this test can be used) for the duration of the COVID-19 declaration under Section 564(b)(1) of the Act, 21 U.S.C.section 360bbb-3(b)(1), unless the authorization is terminated  or revoked sooner.       Influenza A by PCR NEGATIVE NEGATIVE Final   Influenza B by PCR NEGATIVE NEGATIVE Final    Comment: (NOTE) The Xpert Xpress SARS-CoV-2/FLU/RSV plus assay is intended as an aid  in the diagnosis of influenza from Nasopharyngeal swab specimens and should not be used as a sole basis for treatment. Nasal washings and aspirates are unacceptable for Xpert Xpress SARS-CoV-2/FLU/RSV testing.  Fact Sheet for Patients: EntrepreneurPulse.com.au  Fact Sheet for Healthcare Providers: IncredibleEmployment.be  This test is not yet approved or cleared by the [place] FDA and has been authorized for detection and/or diagnosis of SARS-CoV-2 by FDA under an Emergency Use Authorization (EUA). This EUA will remain in effect (meaning this test can be used) for the duration of the COVID-19 declaration under Section 564(b)(1) of the Act, 21 U.S.C. section 360bbb-3(b)(1),  unless the authorization is terminated or revoked.  Performed at [facility], [address] [place], [place] [address]   Culture, blood (routine x 2) Call MD if unable to obtain prior to antibiotics being given     Status: None (Preliminary result)   Collection Time: 01/14/22 10:13 PM   Specimen: BLOOD RIGHT HAND  Result Value Ref Range Status   Specimen Description BLOOD RIGHT HAND BOTTLES DRAWN AEROBIC ONLY  Final   Special Requests   Final    Blood Culture results may not be optimal due to an inadequate volume of blood received in culture bottles   Culture   Final    NO GROWTH <12 HOURS Performed at [facility], [address], [place], [place] [address]    Report Status PENDING  Incomplete  Culture, blood (routine x 2) Call MD if unable to obtain prior to antibiotics being given     Status: None (Preliminary result)   Collection Time: 01/14/22 10:22 PM   Specimen: Left Antecubital; Blood  Result Value Ref Range Status   Specimen Description   Final    LEFT ANTECUBITAL BOTTLES DRAWN AEROBIC AND ANAEROBIC   Special Requests Blood Culture adequate volume  Final   Culture   Final    NO GROWTH <12 HOURS Performed at [facility], [address] [place], [place] [address]    Report Status PENDING  Incomplete  MRSA Next Gen by PCR, Nasal     Status: Abnormal   Collection Time: 01/14/22 11:19 PM   Specimen: Nasal Mucosa; Nasal Swab  Result Value Ref Range Status   MRSA by PCR Next Gen DETECTED (A) NOT DETECTED Final    Comment: RESULT CALLED TO, READ BACK BY AND VERIFIED WITH: WHITE,D @ 0102 ON 01/15/22 BY JUW Performed at [facility], [address] [place],  [address]      Radiology Studies: [facility] Chest Port 1 View  Result Date: 01/14/2022 CLINICAL DATA:  sob EXAM: PORTABLE CHEST 1 VIEW.  Patient is rotated. COMPARISON:  Chest x-ray 01/03/2022, CT chest 01/04/2022 FINDINGS: Tracheostomy tube with tip terminating 8.5 cm above the carina. The heart and mediastinal  contours are grossly unchanged. Low lung volumes persistent retrocardiac airspace opacity. No pulmonary edema. Similar-appearing blunting of bilateral costophrenic angles with no significant pleural effusion. No pneumothorax. No acute osseous abnormality. IMPRESSION: 1. Low lung volumes, persistent retrocardiac airspace opacity with limited evaluation due to patient rotation. 2. Similar-appearing blunting of bilateral costophrenic angles with no significant pleural effusion. Electronically Signed   By: Iven Finn M.D.   On: 01/14/2022 19:49    Scheduled Meds:  apixaban  5 mg Oral BID   budesonide  0.5 mg Nebulization BID   calcitRIOL  0.25 mcg Oral Daily   Chlorhexidine Gluconate Cloth  6 each Topical Q0600   citalopram  20 mg Oral Daily   [START ON 5/99/3570] folic  acid  1 mg Oral Daily   ipratropium-albuterol  3 mL Nebulization Q6H   levETIRAcetam  750 mg Oral BID   levothyroxine  150 mcg Oral Daily   metoprolol tartrate  12.5 mg Oral BID   midodrine  5 mg Oral TID WC   mupirocin ointment  1 Application Nasal BID   [START ON 01/16/2022] cyanocobalamin  500 mcg Oral Daily   Continuous Infusions:  sodium chloride 75 mL/hr at 01/14/22 2203   azithromycin Stopped (01/15/22 0110)   ceFEPime (MAXIPIME) IV 2 g (01/15/22 0858)   [START ON 01/17/2022] vancomycin       LOS: 1 day   Time spent: 35 mins  Maddox Hlavaty Wynetta Emery, MD How to contact the [facility] Attending or Consulting provider [place] or covering provider during after hours [place], for this patient?  Check the care team in [facility] and look for a) attending/consulting TRH provider listed and b) the [facility] team listed Log into [email or web address] and use [place]'s universal password to access. If you do not have the password, please contact the hospital operator. Locate the [facility] provider you are looking for under [facility] and page to a number that you can be directly reached. If you still have difficulty reaching the provider, please page  the [facility] (Director on Call) for the Hospitalists listed on amion for assistance.  01/15/2022, 11:40 AM

## 2022-01-16 ENCOUNTER — Inpatient Hospital Stay (HOSPITAL_COMMUNITY): Payer: Medicaid Other

## 2022-01-16 ENCOUNTER — Encounter (HOSPITAL_COMMUNITY): Payer: Self-pay | Admitting: Family Medicine

## 2022-01-16 DIAGNOSIS — N179 Acute kidney failure, unspecified: Secondary | ICD-10-CM | POA: Diagnosis not present

## 2022-01-16 DIAGNOSIS — J181 Lobar pneumonia, unspecified organism: Secondary | ICD-10-CM | POA: Diagnosis not present

## 2022-01-16 DIAGNOSIS — Z7189 Other specified counseling: Secondary | ICD-10-CM | POA: Diagnosis not present

## 2022-01-16 DIAGNOSIS — D649 Anemia, unspecified: Secondary | ICD-10-CM | POA: Diagnosis not present

## 2022-01-16 DIAGNOSIS — Z515 Encounter for palliative care: Secondary | ICD-10-CM

## 2022-01-16 DIAGNOSIS — J449 Chronic obstructive pulmonary disease, unspecified: Secondary | ICD-10-CM | POA: Diagnosis not present

## 2022-01-16 DIAGNOSIS — I824Y9 Acute embolism and thrombosis of unspecified deep veins of unspecified proximal lower extremity: Secondary | ICD-10-CM | POA: Diagnosis not present

## 2022-01-16 DIAGNOSIS — J9601 Acute respiratory failure with hypoxia: Secondary | ICD-10-CM | POA: Diagnosis not present

## 2022-01-16 LAB — PROCALCITONIN: Procalcitonin: <0.1 ng/mL

## 2022-01-16 LAB — STREP PNEUMONIAE URINARY ANTIGEN: Strep Pneumo Urinary Antigen: NEGATIVE

## 2022-01-16 MED ORDER — HYDROMORPHONE HCL 1 MG/ML IJ SOLN
0.5000 mg | INTRAMUSCULAR | Status: DC | PRN
  Administered 2022-01-16 – 2022-01-17 (×5): 0.5 mg via INTRAVENOUS
  Filled 2022-01-16 (×5): qty 0.5

## 2022-01-16 MED ORDER — SODIUM CHLORIDE 3 % IN NEBU
4.0000 mL | INHALATION_SOLUTION | Freq: Two times a day (BID) | RESPIRATORY_TRACT | Status: AC
  Administered 2022-01-16 – 2022-01-18 (×6): 4 mL via RESPIRATORY_TRACT
  Filled 2022-01-16 (×6): qty 4

## 2022-01-16 MED ORDER — DIPHENHYDRAMINE HCL 25 MG PO CAPS
50.0000 mg | ORAL_CAPSULE | Freq: Four times a day (QID) | ORAL | Status: DC | PRN
  Administered 2022-01-16 – 2022-01-17 (×2): 50 mg via ORAL
  Filled 2022-01-16 (×2): qty 2

## 2022-01-16 NOTE — NC FL2 (Signed)
[place] LEVEL OF CARE SCREENING TOOL     IDENTIFICATION  Patient Name: Veronica Horton Birthdate: [birth year] Sex: female Admission Date (Current Location): 01/14/2022  [facility] and [place] Number:  [employer] and Address:  [facility] [address] [place]      Provider Number: [id number]  Attending Physician Name and Address:  Murlean Iba, MD  Relative Name and Phone Number:       Current Level of Care: Hospital Recommended Level of Care: [place] Prior Approval Number:    Date Approved/Denied:   PASRR Number: [id number]  Discharge Plan: SNF    Current Diagnoses: Patient Active Problem List   Diagnosis Date Noted   Acute respiratory failure with hypoxia ([place]) 01/14/2022   Thyroid disease 01/14/2022   Chronic anemia 01/14/2022   Wide-complex tachycardia 72/12/4707   Acute diastolic CHF (congestive heart failure) ([place]) 01/07/2022   Hypokalemia 01/07/2022   UTI (urinary tract infection) 01/06/2022   Lower extremity cellulitis 01/03/2022   Acute renal failure superimposed on stage 3b chronic kidney disease ([place]) 01/03/2022   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   DVT (deep venous thrombosis) ([place]) 08/08/2021   Seizure disorder ([place]) 11/28/2020   Chronic respiratory failure ([place])    Sepsis due to cellulitis ([place]) 04/24/2020   Abnormal findings on diagnostic imaging of abdomen 04/12/2020   Laryngeal cancer ([place]) 03/18/2020   Tracheostomy status ([place])    Head and neck cancer ([place])    Class 3 obesity ([place]) 01/23/2020   Tobacco abuse 01/23/2020   Pneumonia    Aspiration pneumonitis ([place]) 01/17/2019   Incisional hernia, without obstruction or gangrene    COPD (chronic obstructive pulmonary disease) (HCC)    Hypertension    Polycythemia     Orientation RESPIRATION BLADDER Height & Weight     Self, Time, Situation, Place  O2, Tracheostomy (trach collar, shiley flexible 6 mm  uncuffed) Continent Weight: 270 lb 4.5 oz (122.6 kg) Height:  5' 5.5" (166.4 cm)  BEHAVIORAL SYMPTOMS/MOOD NEUROLOGICAL BOWEL NUTRITION STATUS    Convulsions/Seizures (controlled with medication) Continent Diet (see dc summary)  AMBULATORY STATUS COMMUNICATION OF NEEDS Skin   Extensive Assist Verbally Normal                       Personal Care Assistance Level of Assistance  Bathing, Feeding, Dressing Bathing Assistance: Limited assistance Feeding assistance: Independent Dressing Assistance: Limited assistance     Functional Limitations Info  Sight, Hearing, Speech Sight Info: Adequate Hearing Info: Adequate Speech Info: Adequate    SPECIAL CARE FACTORS FREQUENCY  PT (By licensed PT), OT (By licensed OT)     PT Frequency: 3-5x week OT Frequency: 2-3x week            Contractures Contractures Info: Not present    Additional Factors Info  Code Status, Allergies Code Status Info: DNR Allergies Info: Codeine, Doxycycline, Penicillins           Current Medications (01/16/2022):  This is the current hospital active medication list Current Facility-Administered Medications  Medication Dose Route Frequency Provider Last Rate Last Admin   0.9 %  sodium chloride infusion   Intravenous Continuous Johnson, Clanford L, MD   Stopped at 01/16/22 0814   acetaminophen (TYLENOL) tablet 650 mg  650 mg Per Tube Q6H PRN Zierle-Ghosh, Asia B, DO       albuterol (PROVENTIL) (2.5 MG/3ML) 0.083% nebulizer solution 2.5 mg  2.5 mg Nebulization Q4H  PRN Zierle-Ghosh, [place] B, DO       apixaban (ELIQUIS) tablet 5 mg  5 mg Oral BID Zierle-Ghosh, Asia B, DO   5 mg at 01/15/22 2128   azithromycin (ZITHROMAX) 500 mg in sodium chloride 0.9 % 250 mL IVPB  500 mg Intravenous Q24H Zierle-Ghosh, Asia B, DO 250 mL/hr at 01/15/22 2136 500 mg at 01/15/22 2136   budesonide (PULMICORT) nebulizer solution 0.5 mg  0.5 mg Nebulization BID Zierle-Ghosh, Asia B, DO   0.5 mg at 01/16/22 0736   calcitRIOL  (ROCALTROL) capsule 0.25 mcg  0.25 mcg Oral Daily Zierle-Ghosh, Asia B, DO   0.25 mcg at 01/15/22 0858   ceFEPIme (MAXIPIME) 2 g in sodium chloride 0.9 % 100 mL IVPB  2 g Intravenous Q12H Zierle-Ghosh, Asia B, DO   Stopped at 01/16/22 1047   Chlorhexidine Gluconate Cloth 2 % PADS 6 each  6 each Topical Q0600 Zierle-Ghosh, Asia B, DO   6 each at 01/16/22 0517   citalopram (CELEXA) tablet 20 mg  20 mg Oral Daily Zierle-Ghosh, Asia B, DO   20 mg at 44/81/85 6314   folic acid (FOLVITE) tablet 1 mg  1 mg Oral Daily Johnson, Clanford L, MD       furosemide (LASIX) tablet 40 mg  40 mg Oral BID Johnson, Clanford L, MD   40 mg at 01/15/22 1738   gabapentin (NEURONTIN) capsule 300 mg  300 mg Oral TID PRN Wynetta Emery, Clanford L, MD       HYDROmorphone (DILAUDID) injection 0.5 mg  0.5 mg Intravenous Q4H PRN Johnson, Clanford L, MD   0.5 mg at 01/16/22 1518   ipratropium-albuterol (DUONEB) 0.5-2.5 (3) MG/3ML nebulizer solution 3 mL  3 mL Nebulization Q6H Zierle-Ghosh, Asia B, DO   3 mL at 01/16/22 1329   levETIRAcetam (KEPPRA) 100 MG/ML solution 750 mg  750 mg Oral BID Wynetta Emery, Clanford L, MD   750 mg at 01/15/22 2129   levothyroxine (SYNTHROID) tablet 150 mcg  150 mcg Oral Daily Zierle-Ghosh, Asia B, DO   150 mcg at 01/15/22 0857   metoprolol tartrate (LOPRESSOR) tablet 12.5 mg  12.5 mg Oral BID Zierle-Ghosh, Asia B, DO   12.5 mg at 01/15/22 2127   midodrine (PROAMATINE) tablet 5 mg  5 mg Oral TID WC Johnson, Clanford L, MD   5 mg at 01/16/22 1407   mupirocin ointment (BACTROBAN) 2 % 1 Application  1 Application Nasal BID Zierle-Ghosh, Asia B, DO   1 Application at 97/02/63 1005   oxyCODONE (Oxy IR/ROXICODONE) immediate release tablet 10 mg  10 mg Oral Q6H PRN Johnson, Clanford L, MD   10 mg at 01/15/22 1641   polyethylene glycol powder (GLYCOLAX/MIRALAX) container 17 g  17 g Oral Daily PRN Johnson, Clanford L, MD       sodium chloride HYPERTONIC 3 % nebulizer solution 4 mL  4 mL Nebulization PRN Zierle-Ghosh, Asia  B, DO       sodium chloride HYPERTONIC 3 % nebulizer solution 4 mL  4 mL Nebulization BID Kara Mead V, MD   4 mL at 01/16/22 0912   [START ON 01/17/2022] vancomycin (VANCOREADY) IVPB 1250 mg/250 mL  1,250 mg Intravenous Q48H Rudisill, Tony L, RPH       vitamin B-12 (CYANOCOBALAMIN) tablet 500 mcg  500 mcg Oral Daily Johnson, Clanford L, MD         Discharge Medications: Please see discharge summary for a list of discharge medications.  Relevant Imaging Results:  Relevant Lab Results:  Additional Information SSN: [place], [place]

## 2022-01-16 NOTE — Consult Note (Signed)
NAME:  Veronica Horton, MRN:  [id number], DOB:  06-01-1960, LOS: 2 ADMISSION DATE:  01/14/2022, CONSULTATION DATE:  01/16/2022  REFERRING MD:  Burnard Bunting , CHIEF COMPLAINT: Hypoxia  History of Present Illness:  61 year old woman with laryngeal cancer status post tracheostomy who was recently admitted 9/5 to 01/09/2022 for lower extremity cellulitis .  CT chest showed patchy airspace disease in left upper lobe and bilateral lower lobes and urine culture showed E. coli.  She was treated with ceftriaxone to cover UTI and aspiration pneumonia and cellulitis.  She developed fluid overload and was diuresed with Lasix .  Echo showed normal LV EF, no wall motion abnormality, she developed nonsustained episodes of V. tach on 9/10 and was started on low-dose metoprolol.  She expressed no interest in evaluation or treatment of cancer, and was discharged on 8 L oxygen via trach collar with DNR status and outpatient follow-up with palliative care She was readmitted 9/16 with worsening shortness of breath Unclear whether there was an episode of nausea and vomiting.  She required up to 13 L of oxygen via trach collar, labs were significant for AKI with BUN/creatinine of 25/2.4.  Chest x-ray showed persistent retrocardiac opacity. She was treated with Rocephin and PCCM was consulted Antibiotics were then broadened to cefepime/vancomycin/azithromycin  Pertinent  Medical History  Laryngeal cancer status post tracheostomy 03/2020 "COPD" , tobacco abuse DVT Hypertension Seizure disorder  Significant Hospital Events: Including procedures, antibiotic start and stop dates in addition to other pertinent events   8/3 MBS -dysphagia 3 diet 9/2 swallow reassessment, noncompliant, dysphagia 3 diet again recommended  Interim History / Subjective:  Critically ill-appearing on 13 L oxygen via trach collar, 80%  Objective   Blood pressure (!) 115/45, pulse 74, temperature 98.4 F (36.9 C), temperature source Oral,  resp. rate 20, height 5' 5.5" (1.664 m), weight 122.6 kg, SpO2 94 %.    FiO2 (%):  [40 %-98 %] 40 %   Intake/Output Summary (Last 24 hours) at 01/16/2022 1357 Last data filed at 01/16/2022 0600 Gross per 24 hour  Intake 1690.15 ml  Output 1000 ml  Net 690.15 ml   Filed Weights   01/14/22 1706 01/16/22 0500  Weight: 115.7 kg 122.6 kg    Examination: General: Acutely ill-appearing, obese woman, lying supine in bed HENT: 6.5 tracheostomy, short neck, no lymphadenopathy Lungs: Decreased breath sounds on left, cannot appreciate on right, no accessory muscle use Cardiovascular: S1-S2 distant, no murmur Abdomen: Soft, obese, nontender Extremities: 1+ edema, no deformity Neuro: Awake, interactive, can communicate by mouthing words and writing   Chest x-ray independently reviewed shows left lower lobe infiltrate/volume loss Labs show normal electrolytes, creatinine slight decrease to 2.2, low procalcitonin, no leukocytosis, stable anemia  Resolved Hospital Problem list     Assessment & Plan:  Acute on chronic hypoxic respiratory failure Lower lobe pneumonia/atelectasis Recurrent aspiration suspected  -Repeat swallow evaluation planned -Continue broad-spectrum antibiotics cefepime/vancomycin while awaiting respiratory culture -Chest PT while bed -Use hypertonic saline nebs twice daily -DNR noted, palliative conversations ongoing  Best Practice (right click and "Reselect all SmartList Selections" daily)    Code Status:  DNR Last date of multidisciplinary goals of care discussion [Per primary]  Labs   CBC: Recent Labs  Lab 01/14/22 2006 01/15/22 0415  WBC 8.8 8.9  NEUTROABS 7.2 7.1  HGB 9.7* 9.0*  HCT 31.4* 30.3*  MCV 93.5 95.3  PLT 406* 025    Basic Metabolic Panel: Recent Labs  Lab 01/14/22 2006 01/15/22 0415  NA 139 137  K 3.4* 3.9  CL 99 100  CO2 30 27  GLUCOSE 94 92  BUN 25* 24*  CREATININE 2.44* 2.21*  CALCIUM 8.7* 8.1*  MG  --  2.2    GFR: Estimated Creatinine Clearance: 35 mL/min (A) (by C-G formula based on SCr of 2.21 mg/dL (H)). Recent Labs  Lab 01/14/22 2006 01/15/22 0415 01/16/22 0422  PROCALCITON 0.20 0.14 <0.10  WBC 8.8 8.9  --     Liver Function Tests: Recent Labs  Lab 01/15/22 0415  AST 15  ALT 17  ALKPHOS 88  BILITOT 0.6  PROT 6.4*  ALBUMIN 2.5*   No results for input(s): "LIPASE", "AMYLASE" in the last 168 hours. No results for input(s): "AMMONIA" in the last 168 hours.  ABG    Component Value Date/Time   PHART 7.38 01/14/2022 2208   PCO2ART 50 (H) 01/14/2022 2208   PO2ART 53 (L) 01/14/2022 2208   HCO3 29.6 (H) 01/14/2022 2208   TCO2 32 11/29/2020 0331   ACIDBASEDEF 1.8 01/03/2022 1314   O2SAT 87 01/14/2022 2208     Coagulation Profile: No results for input(s): "INR", "PROTIME" in the last 168 hours.  Cardiac Enzymes: No results for input(s): "CKTOTAL", "CKMB", "CKMBINDEX", "TROPONINI" in the last 168 hours.  HbA1C: Hgb A1c MFr Bld  Date/Time Value Ref Range Status  11/29/2020 04:22 AM 5.5 4.8 - 5.6 % Final    Comment:    (NOTE) Pre diabetes:          5.7%-6.4%  Diabetes:              >6.4%  Glycemic control for   <7.0% adults with diabetes   03/15/2020 04:21 PM 5.4 4.8 - 5.6 % Final    Comment:    (NOTE) Pre diabetes:          5.7%-6.4%  Diabetes:              >6.4%  Glycemic control for   <7.0% adults with diabetes     CBG: No results for input(s): "GLUCAP" in the last 168 hours.  Review of Systems:   Shortness of breath Increased oxygen requirement Bed to wheelchair bound  Past Medical History:  She,  has a past medical history of Aspiration pneumonitis ([place]) (01/17/2019), Bronchitis, Class 3 obesity ([place]) (01/23/2020), COPD (chronic obstructive pulmonary disease) ([place]), Degenerative disc disease, lumbar, DVT (deep venous thrombosis) ([place]) (08/08/2021), Hiatal hernia, Hypertension, Seizure ([name]) (11/28/2020), Small bowel obstruction ([place]) (01/13/2019),  and Throat cancer ([place]).   Surgical History:   Past Surgical History:  Procedure Laterality Date   BIOPSY  04/12/2020   Procedure: BIOPSY;  Surgeon: Wilford Corner, MD;  Location: [facility];  Service: Gastroenterology;;   CHOLECYSTECTOMY     degenerative bone disease     DIRECT LARYNGOSCOPY N/A 03/13/2020   Procedure: DIRECT LARYNGOSCOPY WITH BIOPSY;  Surgeon: Jason Coop, DO;  Location: [name];  Service: ENT;  Laterality: N/A;   FLEXIBLE SIGMOIDOSCOPY N/A 04/12/2020   Procedure: FLEXIBLE SIGMOIDOSCOPY;  Surgeon: Wilford Corner, MD;  Location: [facility];  Service: Gastroenterology;  Laterality: N/A;   INCISIONAL HERNIA REPAIR N/A 01/15/2019   Procedure: [name] HERNIORRHAPHY WITH MESH;  Surgeon: [name], MD;  Location: [facility];  Service: General;  Laterality: N/A;   IR GASTROSTOMY TUBE MOD SED  03/22/2020   IR GJ TUBE CHANGE  07/24/2020   IR RADIOLOGIST EVAL & MGMT  05/07/2020   IR REPLACE G-TUBE SIMPLE WO FLUORO  11/30/2020   OMENTECTOMY N/A 01/15/2019  Procedure: OMENTECTOMY;  Surgeon: [name], MD;  Location: [facility];  Service: General;  Laterality: N/A;   POLYPECTOMY  04/12/2020   Procedure: POLYPECTOMY;  Surgeon: Wilford Corner, MD;  Location: [facility];  Service: Gastroenterology;;   TRACHEOSTOMY TUBE PLACEMENT N/A 03/13/2020   Procedure: AWAKE TRACHEOSTOMY;  Surgeon: Jason Coop, DO;  Location: [place];  Service: ENT;  Laterality: N/A;     Social History:   reports that she has been smoking cigarettes. She has been smoking an average of 1 pack per day. She has never used smokeless tobacco. She reports that she does not drink alcohol and does not use drugs.   Family History:  Her family history includes Hypertension in her mother. There is no history of Sudden Cardiac Death.   Allergies Allergies  Allergen Reactions   Codeine Hives and Itching    Reports itching only per RN   Doxycycline Hives   Penicillins Hives    Did it involve  swelling of the face/tongue/throat, SOB, or low BP? No Did it involve sudden or severe rash/hives, skin peeling, or any reaction on the inside of your mouth or nose? Yes Did you need to seek medical attention at a hospital or doctor's office? Unknown When did it last happen?      Over 10 years If all above answers are "NO", may proceed with cephalosporin use.      Home Medications  Prior to Admission medications   Medication Sig Start Date End Date Taking? Authorizing Provider  apixaban (ELIQUIS) 5 MG TABS tablet Take 1 tablet (5 mg total) by mouth 2 (two) times daily. Restart taking only after next Monday(8/22) Patient taking differently: Take 5 mg by mouth 2 (two) times daily. 12/20/20  Yes Shelly Coss, MD  budesonide (PULMICORT) 0.5 MG/2ML nebulizer solution Take 2 mLs (0.5 mg total) by nebulization 2 (two) times daily. 04/16/20  Yes Pokhrel, [place]man, MD  calcitRIOL (ROCALTROL) 0.25 MCG capsule Take 0.25 mcg by mouth daily. 11/16/21  Yes [provider]  D3 SUPER STRENGTH 50 MCG (2000 UT) CAPS Take 2,000 Units by mouth daily. 11/16/21  Yes [provider]  escitalopram (LEXAPRO) 5 MG tablet Place 1 tablet (5 mg total) into feeding tube daily. 12/13/20  Yes Shelly Coss, MD  folic acid (FOLVITE) 1 MG tablet Place 1 tablet (1 mg total) into feeding tube daily. 12/13/20  Yes Shelly Coss, MD  furosemide (LASIX) 40 MG tablet Take 1 tablet (40 mg total) by mouth 2 (two) times daily. 01/09/22  Yes Tat, Shanon Brow, MD  gabapentin (NEURONTIN) 300 MG capsule Place 2 capsules (600 mg total) into feeding tube at bedtime. Patient taking differently: Place 300 mg into feeding tube 3 (three) times daily as needed (nerve pain). 12/13/20  Yes Shelly Coss, MD  levETIRAcetam (KEPPRA) 100 MG/ML solution Place 7.5 mLs (750 mg total) into feeding tube 2 (two) times daily. 12/13/20  Yes Shelly Coss, MD  levothyroxine (SYNTHROID) 150 MCG tablet Take 150 mcg by mouth daily. 08/08/21  Yes  [provider]  metoprolol tartrate (LOPRESSOR) 25 MG tablet Take 0.5 tablets (12.5 mg total) by mouth 2 (two) times daily. 01/09/22  Yes Tat, Shanon Brow, MD  midodrine (PROAMATINE) 5 MG tablet Place 1 tablet (5 mg total) into feeding tube 3 (three) times daily with meals. 01/09/22  Yes Tat, Shanon Brow, MD  Nystatin (GERHARDT'S BUTT CREAM) CREA Apply 1 application. topically 3 (three) times daily as needed for irritation. 08/13/21  Yes Johnson, Clanford L, MD  oxyCODONE (OXY IR/ROXICODONE)  5 MG immediate release tablet Take 5 mg by mouth every 6 (six) hours as needed for moderate pain. 01/04/22  Yes [provider]  polyethylene glycol powder (GLYCOLAX/MIRALAX) 17 GM/SCOOP powder Dissolve 1 capful (17 g) in liquid and add into feeding tube daily as needed for moderate constipation. 12/13/20  Yes Adhikari, Tamsen Meek, MD  potassium chloride 20 MEQ/15ML (10%) SOLN Take 15 mLs by mouth daily. 07/28/21  Yes [provider]  VENTOLIN HFA 108 (90 Base) MCG/ACT inhaler Inhale 1 puff into the lungs every 4 (four) hours as needed for wheezing or shortness of breath. 12/01/21  Yes [provider]  vitamin B-12 (CYANOCOBALAMIN) 1000 MCG tablet Place 0.5 tablets (500 mcg total) into feeding tube daily. 12/13/20  Yes Shelly Coss, MD  cefdinir (OMNICEF) 300 MG capsule Take 1 capsule (300 mg total) by mouth every 12 (twelve) hours. Patient not taking: Reported on 01/15/2022 01/09/22   Orson Eva, MD  clindamycin (CLEOCIN) 300 MG capsule Take 1 capsule (300 mg total) by mouth every 6 (six) hours. Patient not taking: Reported on 01/15/2022 01/09/22   Orson Eva, MD  ipratropium-albuterol (DUONEB) 0.5-2.5 (3) MG/3ML SOLN Take 3 mLs by nebulization every 6 (six) hours as needed. Patient not taking: Reported on 01/15/2022 04/16/20 01/03/22  Pokhrel, Corrie Mckusick, MD  lidocaine (LIDODERM) 5 % Place 1 patch onto the skin daily. Remove & Discard patch within 12 hours or as directed by MD Patient not taking: Reported on  01/15/2022 08/09/20   Benay Pike, MD  magic mouthwash w/lidocaine SOLN Take 10 mLs by mouth 3 (three) times daily as needed for mouth pain. SWISH AND SPIT. DO NOT SWALLOW. Patient not taking: Reported on 01/15/2022 08/13/21   Murlean Iba, MD       Kara Mead MD. FCCP. [facility] Pager : [phone]  If no response to pager , please call [phone] until 7 pm After 7:00 pm call Elink  [phone]   01/16/2022

## 2022-01-16 NOTE — Progress Notes (Signed)
[facility]) Hospital Liaison note:  This is a pending outpatient-based Palliative Care patient. Will continue to follow for disposition.  Please call with any outpatient palliative questions or concerns.  Thank you, Lorelee Market, LPN [facility] Liaison [phone]

## 2022-01-16 NOTE — Evaluation (Signed)
Clinical/Bedside Swallow Evaluation Patient Details  Name: Veronica Horton MRN: [id number] Date of Birth: 09-25-1960  Today's Date: 01/16/2022 Time: SLP Start Time (ACUTE ONLY): 9150 SLP Stop Time (ACUTE ONLY): 5697 SLP Time Calculation (min) (ACUTE ONLY): 25 min  Past Medical History:  Past Medical History:  Diagnosis Date   Aspiration pneumonitis ([place]) 01/17/2019   Bronchitis    Class 3 obesity ([place]) 01/23/2020   COPD (chronic obstructive pulmonary disease) (HCC)    Degenerative disc disease, lumbar    DVT (deep venous thrombosis) ([place]) 08/08/2021   Hiatal hernia    Hypertension    Seizure ([place]) 11/28/2020   Small bowel obstruction ([place]) 01/13/2019   Throat cancer ([place])    Past Surgical History:  Past Surgical History:  Procedure Laterality Date   BIOPSY  04/12/2020   Procedure: BIOPSY;  Surgeon: Wilford Corner, MD;  Location: [facility];  Service: Gastroenterology;;   CHOLECYSTECTOMY     degenerative bone disease     DIRECT LARYNGOSCOPY N/A 03/13/2020   Procedure: DIRECT LARYNGOSCOPY WITH BIOPSY;  Surgeon: Jason Coop, DO;  Location: [facility];  Service: ENT;  Laterality: N/A;   FLEXIBLE SIGMOIDOSCOPY N/A 04/12/2020   Procedure: Beryle Quant;  Surgeon: Wilford Corner, MD;  Location: [facility];  Service: Gastroenterology;  Laterality: N/A;   INCISIONAL HERNIA REPAIR N/A 01/15/2019   Procedure: [name] HERNIORRHAPHY WITH MESH;  Surgeon: [name], MD;  Location: [facility];  Service: General;  Laterality: N/A;   IR GASTROSTOMY TUBE MOD SED  03/22/2020   IR [place] TUBE CHANGE  07/24/2020   IR RADIOLOGIST EVAL & MGMT  05/07/2020   IR REPLACE G-TUBE SIMPLE WO FLUORO  11/30/2020   OMENTECTOMY N/A 01/15/2019   Procedure: OMENTECTOMY;  Surgeon: [name], MD;  Location: [facility];  Service: General;  Laterality: N/A;   POLYPECTOMY  04/12/2020   Procedure: POLYPECTOMY;  Surgeon: Wilford Corner, MD;  [facility];  Service: Gastroenterology;;    TRACHEOSTOMY TUBE PLACEMENT N/A 03/13/2020   Procedure: AWAKE TRACHEOSTOMY;  Surgeon: Jason Coop, DO;  Location: [facility];  Service: ENT;  Laterality: N/A;   HPI:  61 year old woman with laryngeal cancer status post tracheostomy who was recently admitted 9/5 to 01/09/2022 for lower extremity cellulitis .  CT chest showed patchy airspace disease in left upper lobe and bilateral lower lobes and urine culture showed E. coli.  She was treated with ceftriaxone to cover UTI and aspiration pneumonia and cellulitis.  She developed fluid overload and was diuresed with Lasix .  Echo showed normal LV EF, no wall motion abnormality, she developed nonsustained episodes of V. tach on 9/10 and was started on low-dose metoprolol.  She expressed no interest in evaluation or treatment of cancer, and was discharged on 8 L oxygen via trach collar with DNR status and outpatient follow-up with palliative care  She was readmitted 9/16 with worsening shortness of breath  Unclear whether there was an episode of nausea and vomiting.  She required up to 13 L of oxygen via trach collar, labs were significant for AKI with BUN/creatinine of 25/2.4.  Chest x-ray showed persistent retrocardiac opacity.    Assessment / Plan / Recommendation  Clinical Impression  Pt known to SLP service from previous admissions. Pt last had MBSS 12/01/2020 at [facility] and D3/HTL was recommended. Pt reports that she has not been thickening her liquids since that time. Pt was seen for BSE during recent admission (12/2021) and Pt stated that she did not want to modify her liquids  and was placed on D3 and thin. She is now back in the hospital with suspicion of aspiration PNA. Pt needs further discussion in regards to goals of care- she previously stated that she did not want further treatment for her laryngeal CA. Can try to complete MBSS while in acute stay to provide further assessment of swallow (last was August 2022), however unsure if this will alter  Pt's decisions on acceptance of modified textures/consistencies. Pt does NOT have her PMSV present, however her RN will call family and ask them to bring it in to help with speaking and she should also be wearing during PO intake. Will downgrade diet to D1/puree and NTL for now.    SLP Visit Diagnosis: Dysphagia, oropharyngeal phase (R13.12)    Aspiration Risk  Moderate aspiration risk    Diet Recommendation Dysphagia 1 (Puree);Nectar-thick liquid   Liquid Administration via: Cup;Straw Medication Administration: Whole meds with puree Supervision: Patient able to self feed Compensations: Slow rate;Small sips/bites Postural Changes: Seated upright at 90 degrees;Remain upright for at least 30 minutes after po intake    Other  Recommendations Oral Care Recommendations: Oral care BID Other Recommendations: Clarify dietary restrictions    Recommendations for follow up therapy are one component of a multi-disciplinary discharge planning process, led by the attending physician.  Recommendations may be updated based on patient status, additional functional criteria and insurance authorization.  Follow up Recommendations No SLP follow up      Assistance Recommended at Discharge Set up Supervision/Assistance  Functional Status Assessment Patient has had a recent decline in their functional status and demonstrates the ability to make significant improvements in function in a reasonable and predictable amount of time.  Frequency and Duration min 1 x/week  1 week       Prognosis Prognosis for Safe Diet Advancement: Fair Barriers to Reach Goals: Motivation      Swallow Study   General Date of Onset: 01/14/22 HPI: 61 year old woman with laryngeal cancer status post tracheostomy who was recently admitted 9/5 to 01/09/2022 for lower extremity cellulitis .  CT chest showed patchy airspace disease in left upper lobe and bilateral lower lobes and urine culture showed E. coli.  She was treated with  ceftriaxone to cover UTI and aspiration pneumonia and cellulitis.  She developed fluid overload and was diuresed with Lasix .  Echo showed normal LV EF, no wall motion abnormality, she developed nonsustained episodes of V. tach on 9/10 and was started on low-dose metoprolol.  She expressed no interest in evaluation or treatment of cancer, and was discharged on 8 L oxygen via trach collar with DNR status and outpatient follow-up with palliative care  She was readmitted 9/16 with worsening shortness of breath  Unclear whether there was an episode of nausea and vomiting.  She required up to 13 L of oxygen via trach collar, labs were significant for AKI with BUN/creatinine of 25/2.4.  Chest x-ray showed persistent retrocardiac opacity. Type of Study: Bedside Swallow Evaluation Previous Swallow Assessment: BSE Sept 2023, MBSS 12/01/20 D3/HTL Diet Prior to this Study: Dysphagia 3 (soft);Thin liquids Temperature Spikes Noted: No Respiratory Status: Room air;Trach Collar History of Recent Intubation: No Behavior/Cognition: Alert;Cooperative;Pleasant mood Oral Cavity Assessment: Within Functional Limits Oral Care Completed by SLP: Recent completion by staff Oral Cavity - Dentition: Edentulous Vision: Functional for self-feeding Self-Feeding Abilities: Able to feed self Patient Positioning: Upright in bed Baseline Vocal Quality: Not observed (Pt does not have PMSV present) Volitional Cough: Strong Volitional Swallow: Able to elicit  Oral/Motor/Sensory Function Overall Oral Motor/Sensory Function: Within functional limits   Ice Chips Ice chips: Not tested   Thin Liquid Thin Liquid: Impaired Presentation: Spoon;Self Fed;Cup Pharyngeal  Phase Impairments: Cough - Delayed    Nectar Thick Nectar Thick Liquid: Within functional limits Presentation: Cup;Self Fed   Honey Thick Honey Thick Liquid: Not tested   Puree Puree: Within functional limits Presentation: Spoon   Solid     Solid: Not tested      Thank you,  [name], [place] 01/16/2022,2:40 PM

## 2022-01-16 NOTE — Progress Notes (Signed)
PROGRESS NOTE   Veronica Horton  IRC:[id number] DOB: 08-25-1960 DOA: 01/14/2022 PCP: [facility]   Chief Complaint  Patient presents with   Choking   Level of care: Stepdown  Brief Admission History:  61 y.o. female with medical history significant of aspiration pneumonitis, bronchitis, class III obesity, COPD, DVT, hypertension, seizure, laryngeal cancer status post trach, presents to the ED from facility for vomiting.  Patient was recently discharged from the hospital on September 11.  She has been seen in the hospitals several times recently.  She was in [facility] are with lower extremity edema and pain.  She was discharged home from the ED with clindamycin for which she reports she finished the course.  She continued to have left lower extremity pain and edema.  During her hospitalization here she was started on vancomycin for lower extremity cellulitis.  Patient had a UA that was indicative of UTI and she was started on Rocephin to cover that an aspiration pneumonia.  Her hospitalization was complicated with fluid overload secondary to diastolic CHF for which she was started on IV Lasix and showed improvement.  She had an echo done that showed an ejection fraction of 65-70% as well.  Communication with patient is difficult given that she mouths words at you.  She reports she came in today because she could not breathe.  This started last night.  She has had increase in sputum production.  She denies chest pain.  Apparently was also reported to the ED staff the patient had more nausea and vomiting at the facility.  Patient denies any fevers.  Her only other complaint at this time is chronic back pain.   Patient is DNR, not getting treatment for her laryngeal cancer, and following with palliative.   Assessment and Plan: Chronic anemia - Continue folic acid and P10 - Hemoglobin stable  Thyroid disease - Continue levothyroxine  Acute respiratory failure with hypoxia  (HCC) - recently discharged on 6-8 L via trach from OSH - Hypoxic in the low 80s on home oxygen - Requiring 13 L to maintain oxygen sats - Multifactorial with mucous plugging, pneumonia, COPD - No evidence of pulmonary edema on chest x-ray -BNP 126, down from previous - Low suspicion for PE given that patient is on Eliquis, no missed doses -Wean down O2 as tolerated - Continue breathing treatments - Hypertonic neb to thin secretions - Continue to monitor   Hypokalemia - repleted  Acute renal failure superimposed on stage 3b chronic kidney disease (HCC) --stable, following  DVT (deep venous thrombosis) (HCC) - History of DVT - Continue apixaban  Seizure disorder (HCC) - Continue Keppra  Pneumonia - Retrocardiac opacity on chest x-ray - Thrombocytosis of 406 - Acute respiratory failure with hypoxia - Excessive sputum production - Hypertonic neb to thin secretions - continue broad-spectrum antibiotics including cefepime and Zithromax - Expectorated sputum culture - Blood cultures - Urine Legionella and strep antigens - Continue to monitor  COPD (chronic obstructive pulmonary disease) (HCC) - Continue Pulmicort and DuoNeb - As needed albuterol - Continue to monitor   DVT prophylaxis: apixaban Code Status: DNR  Family Communication:  Disposition: Status is: Inpatient Remains inpatient appropriate because: intensity    Consultants:  Palliative  Procedures:   Antimicrobials:  Cefepime/azithromycin  Subjective: Pt reporting that her legs are hurting.  Having difficulty swallowing.   Objective: Vitals:   01/16/22 1200 01/16/22 1300 01/16/22 1329 01/16/22 1334  BP: (!) 123/52 (!) 115/45    Pulse: 79  80  74  Resp: 20   20  Temp: 98.4 F (36.9 C)     TempSrc: Oral     SpO2: 94% 97% 94% 94%  Weight:      Height:        Intake/Output Summary (Last 24 hours) at 01/16/2022 1405 Last data filed at 01/16/2022 0600 Gross per 24 hour  Intake 1028.75 ml  Output  1000 ml  Net 28.75 ml   Filed Weights   01/14/22 1706 01/16/22 0500  Weight: 115.7 kg 122.6 kg   Examination:  General exam: chronically ill appearing female on trach collar, Appears calm and comfortable  Respiratory system: trach; moderate increased work of breathing. Cardiovascular system: normal S1 & S2 heard. No JVD, murmurs, rubs, gallops or clicks. No pedal edema. Gastrointestinal system: Abdomen is nondistended, soft and nontender. No organomegaly or masses felt. Normal bowel sounds heard. Central nervous system: Alert and oriented. No focal neurological deficits. Extremities: Symmetric 5 x 5 power. Skin: No rashes, lesions or ulcers. Psychiatry: Judgement and insight UTD. Mood & affect appropriate.   Data Reviewed: I have personally reviewed following labs and imaging studies  CBC: Recent Labs  Lab 01/14/22 2006 01/15/22 0415  WBC 8.8 8.9  NEUTROABS 7.2 7.1  HGB 9.7* 9.0*  HCT 31.4* 30.3*  MCV 93.5 95.3  PLT 406* 038    Basic Metabolic Panel: Recent Labs  Lab 01/14/22 2006 01/15/22 0415  NA 139 137  K 3.4* 3.9  CL 99 100  CO2 30 27  GLUCOSE 94 92  BUN 25* 24*  CREATININE 2.44* 2.21*  CALCIUM 8.7* 8.1*  MG  --  2.2    CBG: No results for input(s): "GLUCAP" in the last 168 hours.  Recent Results (from the past 240 hour(s))  Resp Panel by RT-PCR (Flu A&B, Covid) Anterior Nasal Swab     Status: None   Collection Time: 01/14/22  9:03 PM   Specimen: Anterior Nasal Swab  Result Value Ref Range Status   SARS Coronavirus 2 by RT PCR NEGATIVE NEGATIVE Final    Comment: (NOTE) SARS-CoV-2 target nucleic acids are NOT DETECTED.  The SARS-CoV-2 RNA is generally detectable in upper respiratory specimens during the acute phase of infection. The lowest concentration of SARS-CoV-2 viral copies this assay can detect is 138 copies/mL. A negative result does not preclude SARS-Cov-2 infection and should not be used as the sole basis for treatment or other patient  management decisions. A negative result may occur with  improper specimen collection/handling, submission of specimen other than nasopharyngeal swab, presence of viral mutation(s) within the areas targeted by this assay, and inadequate number of viral copies(<138 copies/mL). A negative result must be combined with clinical observations, patient history, and epidemiological information. The expected result is Negative.  Fact Sheet for Patients:  EntrepreneurPulse.com.au  Fact Sheet for Healthcare Providers:  IncredibleEmployment.be  This test is no t yet approved or cleared by the [place] FDA and  has been authorized for detection and/or diagnosis of SARS-CoV-2 by FDA under an Emergency Use Authorization (EUA). This EUA will remain  in effect (meaning this test can be used) for the duration of the COVID-19 declaration under Section 564(b)(1) of the Act, 21 U.S.C.section 360bbb-3(b)(1), unless the authorization is terminated  or revoked sooner.       Influenza A by PCR NEGATIVE NEGATIVE Final   Influenza B by PCR NEGATIVE NEGATIVE Final    Comment: (NOTE) The Xpert Xpress SARS-CoV-2/FLU/RSV plus assay is intended as an aid in  the diagnosis of influenza from Nasopharyngeal swab specimens and should not be used as a sole basis for treatment. Nasal washings and aspirates are unacceptable for Xpert Xpress SARS-CoV-2/FLU/RSV testing.  Fact Sheet for Patients: EntrepreneurPulse.com.au  Fact Sheet for Healthcare Providers: IncredibleEmployment.be  This test is not yet approved or cleared by the [place] FDA and has been authorized for detection and/or diagnosis of SARS-CoV-2 by FDA under an Emergency Use Authorization (EUA). This EUA will remain in effect (meaning this test can be used) for the duration of the COVID-19 declaration under Section 564(b)(1) of the Act, 21 U.S.C. section 360bbb-3(b)(1),  unless the authorization is terminated or revoked.  Performed at [facility], [address] [place], [place] [address]   Culture, blood (routine x 2) Call MD if unable to obtain prior to antibiotics being given     Status: None (Preliminary result)   Collection Time: 01/14/22 10:13 PM   Specimen: BLOOD RIGHT HAND  Result Value Ref Range Status   Specimen Description BLOOD RIGHT HAND BOTTLES DRAWN AEROBIC ONLY  Final   Special Requests   Final    Blood Culture results may not be optimal due to an inadequate volume of blood received in culture bottles   Culture   Final    NO GROWTH 2 DAYS Performed at [facility], [address], [place], [place] [address]    Report Status PENDING  Incomplete  Culture, blood (routine x 2) Call MD if unable to obtain prior to antibiotics being given     Status: None (Preliminary result)   Collection Time: 01/14/22 10:22 PM   Specimen: Left Antecubital; Blood  Result Value Ref Range Status   Specimen Description   Final    LEFT ANTECUBITAL BOTTLES DRAWN AEROBIC AND ANAEROBIC   Special Requests Blood Culture adequate volume  Final   Culture   Final    NO GROWTH 2 DAYS Performed at [facility], [address] [place], [place] [address]    Report Status PENDING  Incomplete  MRSA Next Gen by PCR, Nasal     Status: Abnormal   Collection Time: 01/14/22 11:19 PM   Specimen: Nasal Mucosa; Nasal Swab  Result Value Ref Range Status   MRSA by PCR Next Gen DETECTED (A) NOT DETECTED Final    Comment: RESULT CALLED TO, READ BACK BY AND VERIFIED WITH: WHITE,D @ 0102 ON 01/15/22 BY JUW Performed at [facility], [address] [place], [place] [address]      Radiology Studies: [facility] Chest Port 1 View  Result Date: 01/16/2022 CLINICAL DATA:  Respiratory distress EXAM: PORTABLE CHEST 1 VIEW COMPARISON:  Chest radiograph 01/15/2019 FINDINGS: Increased bilateral pleural effusions, left-greater-than-right. Right basilar linear and left basilar consolidative  opacities may represent atelectasis, but superimposed infection is difficult to exclude. Increased interstitial opacities and peribronchial wall thickening are suggestive of mild pulmonary edema. There is a tracheostomy in place. Visualized upper abdomen is unremarkable. No acute osseous abnormality. IMPRESSION: New mild pulmonary edema with moderate left and small right pleural effusions. Bibasilar opacities may represent atelectasis in the setting of worsening pleural effusions, but a component of superimposed infection is difficult to exclude. Electronically Signed   By: Marin Roberts M.D.   On: 01/16/2022 09:01   DG Chest Port 1 View  Result Date: 01/14/2022 CLINICAL DATA:  sob EXAM: PORTABLE CHEST 1 VIEW.  Patient is rotated. COMPARISON:  Chest x-ray 01/03/2022, CT chest 01/04/2022 FINDINGS: Tracheostomy tube with tip terminating 8.5 cm above the carina. The heart and  mediastinal contours are grossly unchanged. Low lung volumes persistent retrocardiac airspace opacity. No pulmonary edema. Similar-appearing blunting of bilateral costophrenic angles with no significant pleural effusion. No pneumothorax. No acute osseous abnormality. IMPRESSION: 1. Low lung volumes, persistent retrocardiac airspace opacity with limited evaluation due to patient rotation. 2. Similar-appearing blunting of bilateral costophrenic angles with no significant pleural effusion. Electronically Signed   By: Iven Finn M.D.   On: 01/14/2022 19:49    Scheduled Meds:  apixaban  5 mg Oral BID   budesonide  0.5 mg Nebulization BID   calcitRIOL  0.25 mcg Oral Daily   Chlorhexidine Gluconate Cloth  6 each Topical Q0600   citalopram  20 mg Oral Daily   folic acid  1 mg Oral Daily   furosemide  40 mg Oral BID   ipratropium-albuterol  3 mL Nebulization Q6H   levETIRAcetam  750 mg Oral BID   levothyroxine  150 mcg Oral Daily   metoprolol tartrate  12.5 mg Oral BID   midodrine  5 mg Oral TID WC   mupirocin ointment  1  Application Nasal BID   sodium chloride HYPERTONIC  4 mL Nebulization BID   cyanocobalamin  500 mcg Oral Daily   Continuous Infusions:  sodium chloride 10 mL/hr at 01/15/22 1738   azithromycin 500 mg (01/15/22 2136)   ceFEPime (MAXIPIME) IV 2 g (01/16/22 1017)   [START ON 01/17/2022] vancomycin       LOS: 2 days   Time spent: 35 mins  Dimas Scheck Wynetta Emery, MD How to contact the [facility] Attending or Consulting provider [place] or covering provider during after hours [place], for this patient?  Check the care team in [facility] and look for a) attending/consulting [facility] provider listed and b) the [facility] team listed Log into [email or web address] and use [place]'s universal password to access. If you do not have the password, please contact the hospital operator. Locate the [facility] provider you are looking for under [facility] and page to a number that you can be directly reached. If you still have difficulty reaching the provider, please page the [facility] (Director on Call) for the Hospitalists listed on amion for assistance.  01/16/2022, 2:05 PM

## 2022-01-16 NOTE — Consult Note (Signed)
Consultation Note Date: 01/16/2022   Patient Name: Veronica Horton  DOB: [birth year]  MRN: [id number]  Age / Sex: 61 y.o., female  PCP: [facility] Referring Physician: Murlean Iba, MD  Reason for Consultation: Establishing goals of care  HPI/Patient Profile: 61 y.o. 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   Clinical Assessment and Goals of Care: I have reviewed medical records including EPIC notes, labs and imaging, received report from RN, assessed the patient.  Veronica Horton, Veronica Horton, is lying quietly in bed.  She appears acutely/chronically ill, obese and frail.  She is alert and oriented, able to make her basic needs known.  There is no family at bedside at this time.  We may eat at the bedside to discuss diagnosis prognosis, GOC, EOL wishes, disposition and options.  I introduced Palliative Medicine as specialized medical care for people living with serious illness. It focuses on providing relief from the symptoms and stress of a serious illness. The goal is to improve quality of life for both the patient and the family.    We focused on their current illness.  We talk about her lower extremity cellulitis and the treatment plan.  We talk about her respiratory status and the treatment plan.  The natural disease trajectory and expectations at EOL were discussed.  Veronica Horton states that she has "some" help at home.  May benefit from short-term rehab, would need evaluation.  Advanced directives, concepts specific to code  status, artifical feeding and hydration, and rehospitalization were considered and discussed.  Veronica Horton endorses DNR/"treat the treatable, but allowing natural passing".  Palliative Care services outpatient were explained and offered.  We talk about the benefits of outpatient palliative services.  Veronica Horton is agreeable to outpatient palliative services with [facility].  Discussed the importance of continued conversation with family and the medical providers regarding overall plan of care and treatment options, ensuring decisions are within the context of the patient's values and GOCs.  Questions and concerns were addressed.  The patient was encouraged to call with questions or concerns.  PMT will continue to support holistically.  Conference with attending, bedside nursing staff, transition of care team related to patient condition, needs, goals of care, disposition.   HCPOA  HCPOA -Veronica Horton names her sister, Veronica Horton, as her healthcare surrogate.    SUMMARY OF RECOMMENDATIONS   At this point continue to treat the treatable but no CPR or intubation Outpatient palliative services with [facility]   Code Status/Advance Care Planning: DNR  Symptom Management:  Per hospitalist, no additional needs at this time.  Palliative Prophylaxis:  Frequent Pain Assessment, Oral Care, Palliative Wound Care, and Turn Reposition  Additional Recommendations (Limitations, Scope, Preferences): Treat the treatable  but no CPR or intubation  Psycho-social/Spiritual:  Desire for further Chaplaincy support:no Additional Recommendations: Caregiving  Support/Resources and Education on Hospice  Prognosis:  Unable to determine, based on outcomes.  Guarded at this point 3 hospital stays in the last 6 months, poor functional status.  Discharge Planning: To be determined, based on outcomes.      Primary Diagnoses: Present on Admission:  Acute respiratory failure with hypoxia ([place])  Acute renal failure  superimposed on stage 3b chronic kidney disease (HCC)  COPD (chronic obstructive pulmonary disease) (HCC)  DVT (deep venous thrombosis) (HCC)  Hypokalemia  Thyroid disease  Chronic anemia  Pneumonia   I have reviewed the medical record, interviewed the patient and family, and examined the patient. The following aspects are pertinent.  Past Medical History:  Diagnosis Date   Aspiration pneumonitis ([place]) 01/17/2019   Bronchitis    Class 3 obesity ([place]) 01/23/2020   COPD (chronic obstructive pulmonary disease) (HCC)    Degenerative disc disease, lumbar    DVT (deep venous thrombosis) ([place]) 08/08/2021   Hiatal hernia    Hypertension    Seizure ([place]) 11/28/2020   Small bowel obstruction ([place]) 01/13/2019   Throat cancer ([place])    Social History   Socioeconomic History   Marital status: Divorced    Spouse name: Not on file   Number of children: Not on file   Years of education: Not on file   Highest education level: Not on file  Occupational History   Not on file  Tobacco Use   Smoking status: Some Days    Packs/day: 1.00    Types: Cigarettes   Smokeless tobacco: Never  Vaping Use   Vaping Use: Never used  Substance and Sexual Activity   Alcohol use: No   Drug use: No   Sexual activity: Yes    Birth control/protection: Post-menopausal  Other Topics Concern   Not on file  Social History Narrative   Not on file   Social Determinants of Health   Financial Resource Strain: High Risk (08/12/2020)   Overall Financial Resource Strain (CARDIA)    Difficulty of Paying Living Expenses: Very hard  Food Insecurity: No Food Insecurity (01/15/2022)   Hunger Vital Sign    Worried About Running Out of Food in the Last Year: Never true    Ran Out of Food in the Last Year: Never true  Transportation Needs: No Transportation Needs (01/15/2022)   PRAPARE - Hydrologist (Medical): No    Lack of Transportation (Non-Medical): No  Physical Activity: Not on  file  Stress: Not on file  Social Connections: Not on file   Family History  Problem Relation Age of Onset   Hypertension Mother    Sudden Cardiac Death Neg Hx    Scheduled Meds:  apixaban  5 mg Oral BID   budesonide  0.5 mg Nebulization BID   calcitRIOL  0.25 mcg Oral Daily   Chlorhexidine Gluconate Cloth  6 each Topical Q0600   citalopram  20 mg Oral Daily   folic acid  1 mg Oral Daily   furosemide  40 mg Oral BID   ipratropium-albuterol  3 mL Nebulization Q6H   levETIRAcetam  750 mg Oral BID   levothyroxine  150 mcg Oral Daily   metoprolol tartrate  12.5 mg Oral BID   midodrine  5 mg Oral TID WC   mupirocin ointment  1 Application Nasal BID   sodium chloride HYPERTONIC  4 mL Nebulization BID  cyanocobalamin  500 mcg Oral Daily   Continuous Infusions:  sodium chloride 10 mL/hr at 01/15/22 1738   azithromycin 500 mg (01/15/22 2136)   ceFEPime (MAXIPIME) IV 2 g (01/16/22 1017)   [START ON 01/17/2022] vancomycin     PRN Meds:.acetaminophen, albuterol, gabapentin, HYDROmorphone (DILAUDID) injection, oxyCODONE, polyethylene glycol powder, sodium chloride HYPERTONIC Medications Prior to Admission:  Prior to Admission medications   Medication Sig Start Date End Date Taking? Authorizing Provider  apixaban (ELIQUIS) 5 MG TABS tablet Take 1 tablet (5 mg total) by mouth 2 (two) times daily. Restart taking only after next Monday(8/22) Patient taking differently: Take 5 mg by mouth 2 (two) times daily. 12/20/20  Yes Shelly Coss, MD  budesonide (PULMICORT) 0.5 MG/2ML nebulizer solution Take 2 mLs (0.5 mg total) by nebulization 2 (two) times daily. 04/16/20  Yes Pokhrel, Laxman, MD  calcitRIOL (ROCALTROL) 0.25 MCG capsule Take 0.25 mcg by mouth daily. 11/16/21  Yes [provider]  D3 SUPER STRENGTH 50 MCG (2000 UT) CAPS Take 2,000 Units by mouth daily. 11/16/21  Yes [provider]  escitalopram (LEXAPRO) 5 MG tablet Place 1 tablet (5 mg total) into feeding tube daily.  12/13/20  Yes Shelly Coss, MD  folic acid (FOLVITE) 1 MG tablet Place 1 tablet (1 mg total) into feeding tube daily. 12/13/20  Yes Shelly Coss, MD  furosemide (LASIX) 40 MG tablet Take 1 tablet (40 mg total) by mouth 2 (two) times daily. 01/09/22  Yes Tat, Shanon Brow, MD  gabapentin (NEURONTIN) 300 MG capsule Place 2 capsules (600 mg total) into feeding tube at bedtime. Patient taking differently: Place 300 mg into feeding tube 3 (three) times daily as needed (nerve pain). 12/13/20  Yes Shelly Coss, MD  levETIRAcetam (KEPPRA) 100 MG/ML solution Place 7.5 mLs (750 mg total) into feeding tube 2 (two) times daily. 12/13/20  Yes Shelly Coss, MD  levothyroxine (SYNTHROID) 150 MCG tablet Take 150 mcg by mouth daily. 08/08/21  Yes [provider]  metoprolol tartrate (LOPRESSOR) 25 MG tablet Take 0.5 tablets (12.5 mg total) by mouth 2 (two) times daily. 01/09/22  Yes Tat, Shanon Brow, MD  midodrine (PROAMATINE) 5 MG tablet Place 1 tablet (5 mg total) into feeding tube 3 (three) times daily with meals. 01/09/22  Yes Tat, Shanon Brow, MD  Nystatin (GERHARDT'S BUTT CREAM) CREA Apply 1 application. topically 3 (three) times daily as needed for irritation. 08/13/21  Yes Johnson, Clanford L, MD  oxyCODONE (OXY IR/ROXICODONE) 5 MG immediate release tablet Take 5 mg by mouth every 6 (six) hours as needed for moderate pain. 01/04/22  Yes [provider]  polyethylene glycol powder (GLYCOLAX/MIRALAX) 17 GM/SCOOP powder Dissolve 1 capful (17 g) in liquid and add into feeding tube daily as needed for moderate constipation. 12/13/20  Yes Adhikari, Tamsen Meek, MD  potassium chloride 20 MEQ/15ML (10%) SOLN Take 15 mLs by mouth daily. 07/28/21  Yes [provider]  VENTOLIN HFA 108 (90 Base) MCG/ACT inhaler Inhale 1 puff into the lungs every 4 (four) hours as needed for wheezing or shortness of breath. 12/01/21  Yes [provider]  vitamin B-12 (CYANOCOBALAMIN) 1000 MCG tablet Place 0.5 tablets (500 mcg  total) into feeding tube daily. 12/13/20  Yes Shelly Coss, MD  cefdinir (OMNICEF) 300 MG capsule Take 1 capsule (300 mg total) by mouth every 12 (twelve) hours. Patient not taking: Reported on 01/15/2022 01/09/22   Orson Eva, MD  clindamycin (CLEOCIN) 300 MG capsule Take 1 capsule (300 mg total) by mouth every 6 (six) hours. Patient  not taking: Reported on 01/15/2022 01/09/22   Tat, Shanon Brow, MD  ipratropium-albuterol (DUONEB) 0.5-2.5 (3) MG/3ML SOLN Take 3 mLs by nebulization every 6 (six) hours as needed. Patient not taking: Reported on 01/15/2022 04/16/20 01/03/22  Pokhrel, Corrie Mckusick, MD  lidocaine (LIDODERM) 5 % Place 1 patch onto the skin daily. Remove & Discard patch within 12 hours or as directed by MD Patient not taking: Reported on 01/15/2022 08/09/20   Benay Pike, MD  magic mouthwash w/lidocaine SOLN Take 10 mLs by mouth 3 (three) times daily as needed for mouth pain. SWISH AND SPIT. DO NOT SWALLOW. Patient not taking: Reported on 01/15/2022 08/13/21   Murlean Iba, MD   Allergies  Allergen Reactions   Codeine Hives and Itching    Reports itching only per RN   Doxycycline Hives   Penicillins Hives    Did it involve swelling of the face/tongue/throat, SOB, or low BP? No Did it involve sudden or severe rash/hives, skin peeling, or any reaction on the inside of your mouth or nose? Yes Did you need to seek medical attention at a hospital or doctor's office? Unknown When did it last happen?      Over 10 years If all above answers are "NO", may proceed with cephalosporin use.    Review of Systems  Unable to perform ROS: Other    Physical Exam Vitals and nursing note reviewed.  Constitutional:      General: She is not in acute distress.    Appearance: She is obese. She is ill-appearing.  Cardiovascular:     Rate and Rhythm: Normal rate.  Pulmonary:     Effort: Pulmonary effort is normal.  Musculoskeletal:        General: Swelling present.  Skin:    General: Skin is warm and  dry.     Findings: Erythema present.  Neurological:     Mental Status: She is alert and oriented to person, place, and time.  Psychiatric:        Mood and Affect: Mood normal.        Behavior: Behavior normal.     Vital Signs: BP (!) 98/44   Pulse 94   Temp 98.4 F (36.9 C) (Oral)   Resp 20   Ht 5' 5.5" (1.664 m)   Wt 122.6 kg   SpO2 94%   BMI 44.29 kg/m  Pain Scale: 0-10   Pain Score: 0-No pain   SpO2: SpO2: 94 % O2 Device:SpO2: 94 % O2 Flow Rate: .O2 Flow Rate (L/min): 10 L/min  IO: Intake/output summary:  Intake/Output Summary (Last 24 hours) at 01/16/2022 1423 Last data filed at 01/16/2022 0600 Gross per 24 hour  Intake 1028.75 ml  Output 1000 ml  Net 28.75 ml    LBM:   Baseline Weight: Weight: 115.7 kg Most recent weight: Weight: 122.6 kg     Palliative Assessment/Data:   Flowsheet Rows    Flowsheet Row Most Recent Value  Intake Tab   Referral Department Hospitalist  Unit at Time of Referral Intermediate Care Unit  Palliative Care Primary Diagnosis Pulmonary  Date Notified 01/15/22  Palliative Care Type New Palliative care  Reason for referral Clarify Goals of Care  Clinical Assessment   Palliative Performance Scale Score 30%  Pain Max last 24 hours Not able to report  Pain Min Last 24 hours Not able to report  Dyspnea Max Last 24 Hours Not able to report  Dyspnea Min Last 24 hours Not able to report  Psychosocial & Spiritual Assessment  Palliative Care Outcomes        Time In: 1130 Time Out: 1225 Time Total: 55 minutes Greater than 50%  of this time was spent counseling and coordinating care related to the above assessment and plan.  Signed by: Drue Novel, NP   Please contact Palliative Medicine Team phone at [phone] for questions and concerns.  For individual provider: See Shea Evans

## 2022-01-16 NOTE — Progress Notes (Signed)
SLP Dabney at bedside while this RN administered Midodrine in applesauce. BPt tolerated well. Pt does not have her speaking valve which would help with her swallowing. This RN called family to bring valve. [name]

## 2022-01-16 NOTE — Progress Notes (Signed)
Per report from night shift RN, patient had difficulty with swallowing meds. She coughed a lot and required to be suctioned. This RN reached out to Dr. Wynetta Emery regarding holding Po meds. He reports he will also place another SLP evaluation. [name]

## 2022-01-16 NOTE — TOC Initial Note (Signed)
Transition of Care [facility]) - Initial/Assessment Note    Patient Details  Name: Veronica Horton MRN: [id number] Date of Birth: May 12, 1960  Transition of Care [facility]) CM/SW Contact:    Shade Flood, LCSW Phone Number: 01/16/2022, 4:17 PM  Clinical Narrative:                  Pt admitted from home. She was recently discharged from [facility]. At this time, pt and family asking for SNF referral to [facility]. She would like to go there for 30 days of rehab. Updated Bryson Ha at [facility] and they will assess once referral received in Epic.  TOC will follow.  Expected Discharge Plan: Skilled Nursing Facility Barriers to Discharge: Continued Medical Work up   Patient Goals and CMS Choice Patient states their goals for this hospitalization and ongoing recovery are:: get better CMS Medicare.gov Compare Post Acute Care list provided to:: Patient Represenative (must comment) Choice offered to / list presented to : Sibling  Expected Discharge Plan and Services Expected Discharge Plan: [place] In-house Referral: Clinical Social Work   Post Acute Care Choice: [place] Living arrangements for the past 2 months: [place]                                      Prior Living Arrangements/Services Living arrangements for the past 2 months: Single Family Home Lives with:: Significant Other Patient language and need for interpreter reviewed:: Yes Do you feel safe going back to the place where you live?: Yes      Need for Family Participation in Patient Care: Yes (Comment) Care giver support system in place?: Yes (comment) Current home services: DME Criminal Activity/Legal Involvement Pertinent to Current Situation/Hospitalization: No - Comment as needed  Activities of Daily Living Home Assistive Devices/Equipment: Bedside commode/3-in-1, CBG Meter, Cough assistive device (Peds), Nebulizer, Oxygen, Shower chair without back, Walker  (specify type) ADL Screening (condition at time of admission) Patient's cognitive ability adequate to safely complete daily activities?: Yes Is the patient deaf or have difficulty hearing?: No Does the patient have difficulty seeing, even when wearing glasses/contacts?: No Does the patient have difficulty concentrating, remembering, or making decisions?: Yes Patient able to express need for assistance with ADLs?: No Does the patient have difficulty dressing or bathing?: Yes Independently performs ADLs?: No Communication: Independent with device (comment) Dressing (OT): Needs assistance Is this a change from baseline?: Pre-admission baseline Grooming: Needs assistance Is this a change from baseline?: Pre-admission baseline Feeding: Needs assistance Is this a change from baseline?: Pre-admission baseline Bathing: Needs assistance Is this a change from baseline?: Pre-admission baseline Toileting: Needs assistance Is this a change from baseline?: Pre-admission baseline In/Out Bed: Needs assistance Is this a change from baseline?: Pre-admission baseline Walks in Home: Needs assistance Is this a change from baseline?: Pre-admission baseline Does the patient have difficulty walking or climbing stairs?: Yes Weakness of Legs: Both Weakness of Arms/Hands: Both  Permission Sought/Granted Permission sought to share information with : Chartered certified accountant granted to share information with : Yes, Verbal Permission Granted     Permission granted to share info w AGENCY: snfs        Emotional Assessment       Orientation: : Oriented to Self, Oriented to Place, Oriented to  Time, Oriented to Situation Alcohol / Substance Use: Not Applicable Psych Involvement: No (comment)  Admission diagnosis:  Acute  respiratory failure with hypoxia ([place]) [J96.01] Dysphagia, unspecified type [R13.10] Patient Active Problem List   Diagnosis Date Noted   Acute respiratory failure  with hypoxia ([place]) 01/14/2022   Thyroid disease 01/14/2022   Chronic anemia 01/14/2022   Wide-complex tachycardia 99/35/7017   Acute diastolic CHF (congestive heart failure) ([place]) 01/07/2022   Hypokalemia 01/07/2022   UTI (urinary tract infection) 01/06/2022   Lower extremity cellulitis 01/03/2022   Acute renal failure superimposed on stage 3b chronic kidney disease ([place]) 01/03/2022   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   DVT (deep venous thrombosis) ([place]) 08/08/2021   Seizure disorder ([place]) 11/28/2020   Chronic respiratory failure ([place])    Sepsis due to cellulitis ([employer] [facility]) 04/24/2020   Abnormal findings on diagnostic imaging of abdomen 04/12/2020   Laryngeal cancer ([place]) 03/18/2020   Tracheostomy status ([place])    Head and neck cancer ([place])    Class 3 obesity ([place]) 01/23/2020   Tobacco abuse 01/23/2020   Pneumonia    Aspiration pneumonitis ([place]) 01/17/2019   Incisional hernia, without obstruction or gangrene    COPD (chronic obstructive pulmonary disease) ([place])    Hypertension    Polycythemia    PCP:  [facility]:   [place], [place] - [facility] [address] [place] [address] Phone: [phone] Fax: [phone]  [facility] [address] [place] [address] Phone: [phone] Fax: [phone]     Social Determinants of Health (SDOH) Interventions    Readmission Risk Interventions    08/09/2021    2:37 PM 04/26/2020   10:28 AM  Readmission Risk Prevention Plan  Transportation Screening Complete Complete  Medication Review ([place]) Complete Complete  PCP or Specialist appointment within 3-5 days of discharge  Complete  HRI or [place] Complete Complete  SW Recovery Care/Counseling Consult Complete Complete  Palliative Care Screening Not Applicable Not [place] Not Applicable Patient Refused

## 2022-01-17 ENCOUNTER — Inpatient Hospital Stay: Payer: Medicaid Other | Admitting: Nurse Practitioner

## 2022-01-17 DIAGNOSIS — T17908A Unspecified foreign body in respiratory tract, part unspecified causing other injury, initial encounter: Secondary | ICD-10-CM

## 2022-01-17 DIAGNOSIS — I824Y9 Acute embolism and thrombosis of unspecified deep veins of unspecified proximal lower extremity: Secondary | ICD-10-CM | POA: Diagnosis not present

## 2022-01-17 DIAGNOSIS — J9601 Acute respiratory failure with hypoxia: Secondary | ICD-10-CM | POA: Diagnosis not present

## 2022-01-17 DIAGNOSIS — D649 Anemia, unspecified: Secondary | ICD-10-CM | POA: Diagnosis not present

## 2022-01-17 DIAGNOSIS — J449 Chronic obstructive pulmonary disease, unspecified: Secondary | ICD-10-CM | POA: Diagnosis not present

## 2022-01-17 DIAGNOSIS — N179 Acute kidney failure, unspecified: Secondary | ICD-10-CM | POA: Diagnosis not present

## 2022-01-17 NOTE — Progress Notes (Signed)
NAME:  Veronica Horton, MRN:  [id number], DOB:  [birth year], LOS: 3 ADMISSION DATE:  01/14/2022, CONSULTATION DATE:  01/17/2022  REFERRING MD:  Burnard Bunting , CHIEF COMPLAINT: Hypoxia  History of Present Illness:  61 year old woman with laryngeal cancer status post tracheostomy who was recently admitted 9/5 to 01/09/2022 for lower extremity cellulitis .  CT chest showed patchy airspace disease in left upper lobe and bilateral lower lobes and urine culture showed E. coli.  She was treated with ceftriaxone to cover UTI and aspiration pneumonia and cellulitis.  She developed fluid overload and was diuresed with Lasix .  Echo showed normal LV EF, no wall motion abnormality, she developed nonsustained episodes of V. tach on 9/10 and was started on low-dose metoprolol.  She expressed no interest in evaluation or treatment of cancer, and was discharged on 8 L oxygen via trach collar with DNR status and outpatient follow-up with palliative care She was readmitted 9/16 with worsening shortness of breath Unclear whether there was an episode of nausea and vomiting.  She required up to 13 L of oxygen via trach collar, labs were significant for AKI with BUN/creatinine of 25/2.4.  Chest x-ray showed persistent retrocardiac opacity. 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 Able to phonate with Passy-Muir valve Denies chest pain or dyspnea Per RT, tracheostomy fell out and was replaced. Patient states that she did  not mean to and fell out accidentally   Objective   Blood pressure (!) 121/55, pulse (!) 52, temperature 98.5 F (36.9 C), temperature source Oral, resp. rate 17, height 5' 5.5" (1.664 m), weight 123.3 kg, SpO2 93 %.    FiO2 (%):  [40 %-60 %] 40 %   Intake/Output Summary (Last 24 hours) at 01/17/2022 1136 Last data filed at 01/17/2022 1057 Gross per 24 hour  Intake 772.6 ml  Output 800 ml  Net -27.4 ml    Filed Weights   01/14/22 1706 01/16/22 0500 01/17/22 0616  Weight: 115.7 kg 122.6 kg 123.3 kg    Examination: General: Chronically ill-appearing, obese woman, lying supine in bed HENT: 6.5 tracheostomy, short neck, no lymphadenopathy Lungs: No accessory muscle use, decreased breath sounds on left Cardiovascular: S1-S2 distant, no murmur Abdomen: Soft, obese, nontender Extremities: 1+ edema, no deformity Neuro: Awake, interactive, can phonate with Passy-Muir valve   Chest x-ray independently reviewed shows left lower lobe infiltrate/volume loss Labs 9/18 show normal electrolytes, creatinine slight decrease to 2.2, low procalcitonin, no leukocytosis, stable anemia  Resolved Hospital Problem list     Assessment & Plan:  Acute on chronic hypoxic respiratory failure Lower lobe pneumonia/atelectasis Recurrent aspiration suspected  -Main issue here is that she keeps aspirating and is not compliant with dysphagia diet as prescribed. -Continue broad-spectrum antibiotics cefepime/vancomycin while awaiting respiratory culture -Chest PT while bed -Use hypertonic saline nebs twice daily  Laryngeal cancer -does not want aggressive therapy -DNR noted, since she is not compliant with dysphagia diet, would elevate level of palliation  PCCM available as needed  Best Practice (right click and "Reselect all SmartList Selections" daily)    Code  Status:  DNR Last date of multidisciplinary goals of care discussion [Per primary]  Labs   CBC: Recent Labs  Lab 01/14/22 2006  01/15/22 0415  WBC 8.8 8.9  NEUTROABS 7.2 7.1  HGB 9.7* 9.0*  HCT 31.4* 30.3*  MCV 93.5 95.3  PLT 406* 366     Basic Metabolic Panel: Recent Labs  Lab 01/14/22 2006 01/15/22 0415  NA 139 137  K 3.4* 3.9  CL 99 100  CO2 30 27  GLUCOSE 94 92  BUN 25* 24*  CREATININE 2.44* 2.21*  CALCIUM 8.7* 8.1*  MG  --  2.2    GFR: Estimated Creatinine Clearance: 35.1 mL/min (A) (by C-G formula based on SCr of 2.21 mg/dL (H)). Recent Labs  Lab 01/14/22 2006 01/15/22 0415 01/16/22 0422  PROCALCITON 0.20 0.14 <0.10  WBC 8.8 8.9  --      Liver Function Tests: Recent Labs  Lab 01/15/22 0415  AST 15  ALT 17  ALKPHOS 88  BILITOT 0.6  PROT 6.4*  ALBUMIN 2.5*    No results for input(s): "LIPASE", "AMYLASE" in the last 168 hours. No results for input(s): "AMMONIA" in the last 168 hours.  ABG    Component Value Date/Time   PHART 7.38 01/14/2022 2208   PCO2ART 50 (H) 01/14/2022 2208   PO2ART 53 (L) 01/14/2022 2208   HCO3 29.6 (H) 01/14/2022 2208   TCO2 32 11/29/2020 0331   ACIDBASEDEF 1.8 01/03/2022 1314   O2SAT 87 01/14/2022 2208     Coagulation Profile: No results for input(s): "INR", "PROTIME" in the last 168 hours.  Cardiac Enzymes: No results for input(s): "CKTOTAL", "CKMB", "CKMBINDEX", "TROPONINI" in the last 168 hours.  HbA1C: Hgb A1c MFr Bld  Date/Time Value Ref Range Status  11/29/2020 04:22 AM 5.5 4.8 - 5.6 % Final    Comment:    (NOTE) Pre diabetes:          5.7%-6.4%  Diabetes:              >6.4%  Glycemic control for   <7.0% adults with diabetes   03/15/2020 04:21 PM 5.4 4.8 - 5.6 % Final    Comment:    (NOTE) Pre diabetes:          5.7%-6.4%  Diabetes:              >6.4%  Glycemic control for   <7.0% adults with diabetes     CBG: No results for input(s): "GLUCAP" in the last 168 hours.   Kara Mead MD. Shade Flood. [place] [facility] Pager : [phone]  If no response to pager , please call [phone] until 7 pm After  7:00 pm call Elink  [phone]   01/17/2022

## 2022-01-17 NOTE — Progress Notes (Signed)
Came into patient's room to give treatment and patient had pulled inner cannula out and it was laying on bedside table.  Will replace after treatment.

## 2022-01-17 NOTE — Progress Notes (Signed)
Patient pulled her trach out.  Able to use obturator to put it back in and confirmed placement with ETCO2 detector and Bilateral BS equal. Suctioned for thick, yellow secretions, changed inner cannula and replaced trach collar.  Tolerated well. Sats stable at 94%

## 2022-01-17 NOTE — Progress Notes (Signed)
Patient, again, has pulled trach collar off multiple times. This RN asks patient what she needs, she says "help me" multiple times. Asked patient how can we help her. Patient just states "help me". Finally after multiple tries, patient finally tells this RN that her back hurts. Patient agreeable to pain medication. Pain medication administered per MAR orders. MD, Chaplain, Palliative NP made aware of patient pulling her trach collar off and requested assistance with further palliative/comfort care discussion. Further discussions planned for tomorrow 9/20.

## 2022-01-17 NOTE — Progress Notes (Signed)
Patient continues to pull her oxygen through trach collar off, and oxygen saturations fall into the 70s. Patient educated about leaving her oxygen through the trach collar on to help with her oxygenation.

## 2022-01-17 NOTE — Progress Notes (Signed)
PROGRESS NOTE   Veronica Horton  XKG:[id number] DOB: [birth year] DOA: 01/14/2022 PCP: [facility]   Chief Complaint  Patient presents with   Choking   Level of care: Stepdown  Brief Admission History:  61 y.o. female with medical history significant of aspiration pneumonitis, bronchitis, class III obesity, COPD, DVT, hypertension, seizure, laryngeal cancer status post trach, presents to the ED from facility for vomiting.  Patient was recently discharged from the hospital on September 11.  She has been seen in the hospitals several times recently.  She was in [facility] are with lower extremity edema and pain.  She was discharged home from the ED with clindamycin for which she reports she finished the course.  She continued to have left lower extremity pain and edema.  During her hospitalization here she was started on vancomycin for lower extremity cellulitis.  Patient had a UA that was indicative of UTI and she was started on Rocephin to cover that an aspiration pneumonia.  Her hospitalization was complicated with fluid overload secondary to diastolic CHF for which she was started on IV Lasix and showed improvement.  She had an echo done that showed an ejection fraction of 65-70% as well.  Communication with patient is difficult given that she mouths words at you.  She reports she came in today because she could not breathe.  This started last night.  She has had increase in sputum production.  She denies chest pain.  Apparently was also reported to the ED staff the patient had more nausea and vomiting at the facility.  Patient denies any fevers.  Her only other complaint at this time is chronic back pain.   Patient is DNR, not getting treatment for her laryngeal cancer, and following with palliative.   Assessment and Plan: Acute respiratory failure with hypoxia (HCC) - recently discharged on 6-8 L via trach from OSH - Presented hypoxic in the low 80s on home oxygen - Required  up to 13 L to maintain oxygen sats on admission - Multifactorial with mucous plugging, pneumonia, COPD - New evidence of pulmonary edema on chest x-ray 9/18 - continue IV lasix  - Low suspicion for PE given that patient is on Eliquis, no missed doses -Wean down O2 as tolerated - Continue breathing treatments - Hypertonic neb to thin secretions - Continue to monitor   Acute heart failure with preserved ejection fraction (HFpEF) ([place]) -- continue IV lasix for diuresis, recheck pCXR in AM   Chronic anemia - Continue folic acid and F02 - Hemoglobin stable, recheck CBC in AM  Thyroid disease - Continue levothyroxine  Hypokalemia - repleted  Acute renal failure superimposed on stage 3b chronic kidney disease (HCC) --stable, following  DVT (deep venous thrombosis) ([place]) - History of DVT - Continue apixaban  Seizure disorder ([place]) - Continue Keppra  Pneumonia - Retrocardiac opacity on chest x-ray - Thrombocytosis of 406 - Acute respiratory failure with hypoxia - Excessive sputum production - Hypertonic neb to thin secretions - continue broad-spectrum antibiotics including cefepime and Zithromax - Expectorated sputum culture - Blood cultures - Urine Legionella and strep antigens - Continue to monitor  COPD (chronic obstructive pulmonary disease) (HCC) - Continue Pulmicort and DuoNeb - As needed albuterol - Continue to monitor   DVT prophylaxis: apixaban Code Status: DNR  Family Communication:  Disposition: Status is: Inpatient Remains inpatient appropriate because: intensity    Consultants:  Palliative  Procedures:   Antimicrobials:  Cefepime/azithromycin  Subjective: Pt pulled out trach this  morning, RRT was able to replace it and suction patient of thick yellow secretions.   Objective: Vitals:   01/17/22 0747 01/17/22 0800 01/17/22 0845 01/17/22 0900  BP:  (!) 101/47  114/65  Pulse:  84 77 96  Resp:  18 18 (!) 21  Temp:      TempSrc:      SpO2: 92%  92% 95% 93%  Weight:      Height:        Intake/Output Summary (Last 24 hours) at 01/17/2022 0939 Last data filed at 01/17/2022 3716 Gross per 24 hour  Intake 372.6 ml  Output 800 ml  Net -427.4 ml   Filed Weights   01/14/22 1706 01/16/22 0500 01/17/22 0616  Weight: 115.7 kg 122.6 kg 123.3 kg   Examination:  General exam: chronically ill appearing female on trach collar, Appears calm and comfortable  Respiratory system: trach; moderate increased work of breathing. Cardiovascular system: normal S1 & S2 heard. No JVD, murmurs, rubs, gallops or clicks. No pedal edema. Gastrointestinal system: Abdomen is nondistended, soft and nontender. No organomegaly or masses felt. Normal bowel sounds heard. Central nervous system: Alert and oriented. No focal neurological deficits. Extremities: Symmetric 5 x 5 power. Skin: No rashes, lesions or ulcers. Psychiatry: Judgement and insight UTD. Mood & affect appropriate.   Data Reviewed: I have personally reviewed following labs and imaging studies  CBC: Recent Labs  Lab 01/14/22 2006 01/15/22 0415  WBC 8.8 8.9  NEUTROABS 7.2 7.1  HGB 9.7* 9.0*  HCT 31.4* 30.3*  MCV 93.5 95.3  PLT 406* 967    Basic Metabolic Panel: Recent Labs  Lab 01/14/22 2006 01/15/22 0415  NA 139 137  K 3.4* 3.9  CL 99 100  CO2 30 27  GLUCOSE 94 92  BUN 25* 24*  CREATININE 2.44* 2.21*  CALCIUM 8.7* 8.1*  MG  --  2.2    CBG: No results for input(s): "GLUCAP" in the last 168 hours.  Recent Results (from the past 240 hour(s))  Resp Panel by RT-PCR (Flu A&B, Covid) Anterior Nasal Swab     Status: None   Collection Time: 01/14/22  9:03 PM   Specimen: Anterior Nasal Swab  Result Value Ref Range Status   SARS Coronavirus 2 by RT PCR NEGATIVE NEGATIVE Final    Comment: (NOTE) SARS-CoV-2 target nucleic acids are NOT DETECTED.  The SARS-CoV-2 RNA is generally detectable in upper respiratory specimens during the acute phase of infection. The  lowest concentration of SARS-CoV-2 viral copies this assay can detect is 138 copies/mL. A negative result does not preclude SARS-Cov-2 infection and should not be used as the sole basis for treatment or other patient management decisions. A negative result may occur with  improper specimen collection/handling, submission of specimen other than nasopharyngeal swab, presence of viral mutation(s) within the areas targeted by this assay, and inadequate number of viral copies(<138 copies/mL). A negative result must be combined with clinical observations, patient history, and epidemiological information. The expected result is Negative.  Fact Sheet for Patients:  EntrepreneurPulse.com.au  Fact Sheet for Healthcare Providers:  IncredibleEmployment.be  This test is no t yet approved or cleared by the [place] FDA and  has been authorized for detection and/or diagnosis of SARS-CoV-2 by FDA under an Emergency Use Authorization (EUA). This EUA will remain  in effect (meaning this test can be used) for the duration of the COVID-19 declaration under Section 564(b)(1) of the Act, 21 U.S.C.section 360bbb-3(b)(1), unless the authorization is terminated  or revoked  sooner.       Influenza A by PCR NEGATIVE NEGATIVE Final   Influenza B by PCR NEGATIVE NEGATIVE Final    Comment: (NOTE) The Xpert Xpress SARS-CoV-2/FLU/RSV plus assay is intended as an aid in the diagnosis of influenza from Nasopharyngeal swab specimens and should not be used as a sole basis for treatment. Nasal washings and aspirates are unacceptable for Xpert Xpress SARS-CoV-2/FLU/RSV testing.  Fact Sheet for Patients: EntrepreneurPulse.com.au  Fact Sheet for Healthcare Providers: IncredibleEmployment.be  This test is not yet approved or cleared by the [place] FDA and has been authorized for detection and/or diagnosis of SARS-CoV-2 by FDA under  an Emergency Use Authorization (EUA). This EUA will remain in effect (meaning this test can be used) for the duration of the COVID-19 declaration under Section 564(b)(1) of the Act, 21 U.S.C. section 360bbb-3(b)(1), unless the authorization is terminated or revoked.  Performed at [facility], [address] [place], [place] [address]   Culture, blood (routine x 2) Call MD if unable to obtain prior to antibiotics being given     Status: None (Preliminary result)   Collection Time: 01/14/22 10:13 PM   Specimen: BLOOD RIGHT HAND  Result Value Ref Range Status   Specimen Description BLOOD RIGHT HAND BOTTLES DRAWN AEROBIC ONLY  Final   Special Requests   Final    Blood Culture results may not be optimal due to an inadequate volume of blood received in culture bottles   Culture   Final    NO GROWTH 3 DAYS Performed at [facility], [address], [place], [place] [address]    Report Status PENDING  Incomplete  Culture, blood (routine x 2) Call MD if unable to obtain prior to antibiotics being given     Status: None (Preliminary result)   Collection Time: 01/14/22 10:22 PM   Specimen: Left Antecubital; Blood  Result Value Ref Range Status   Specimen Description   Final    LEFT ANTECUBITAL BOTTLES DRAWN AEROBIC AND ANAEROBIC   Special Requests Blood Culture adequate volume  Final   Culture   Final    NO GROWTH 3 DAYS Performed at [facility], [address], [place], [place] [address]    Report Status PENDING  Incomplete  MRSA Next Gen by PCR, Nasal     Status: Abnormal   Collection Time: 01/14/22 11:19 PM   Specimen: Nasal Mucosa; Nasal Swab  Result Value Ref Range Status   MRSA by PCR Next Gen DETECTED (A) NOT DETECTED Final    Comment: RESULT CALLED TO, READ BACK BY AND VERIFIED WITH: WHITE,D @ 0102 ON 01/15/22 BY JUW Performed at [facility], [address] [place],  [address]      Radiology Studies: [facility] Chest Port 1 View  Result Date: 01/16/2022 CLINICAL DATA:   Respiratory distress EXAM: PORTABLE CHEST 1 VIEW COMPARISON:  Chest radiograph 01/15/2019 FINDINGS: Increased bilateral pleural effusions, left-greater-than-right. Right basilar linear and left basilar consolidative opacities may represent atelectasis, but superimposed infection is difficult to exclude. Increased interstitial opacities and peribronchial wall thickening are suggestive of mild pulmonary edema. There is a tracheostomy in place. Visualized upper abdomen is unremarkable. No acute osseous abnormality. IMPRESSION: New mild pulmonary edema with moderate left and small right pleural effusions. Bibasilar opacities may represent atelectasis in the setting of worsening pleural effusions, but a component of superimposed infection is difficult to exclude. Electronically Signed   By: Marin Roberts M.D.   On: 01/16/2022 09:01    Scheduled Meds:  apixaban  5 mg Oral BID   budesonide  0.5 mg Nebulization BID   calcitRIOL  0.25 mcg Oral Daily   Chlorhexidine Gluconate Cloth  6 each Topical Q0600   citalopram  20 mg Oral Daily   folic acid  1 mg Oral Daily   furosemide  40 mg Oral BID   ipratropium-albuterol  3 mL Nebulization Q6H   levETIRAcetam  750 mg Oral BID   levothyroxine  150 mcg Oral Daily   metoprolol tartrate  12.5 mg Oral BID   midodrine  5 mg Oral TID WC   mupirocin ointment  1 Application Nasal BID   sodium chloride HYPERTONIC  4 mL Nebulization BID   cyanocobalamin  500 mcg Oral Daily   Continuous Infusions:  sodium chloride Stopped (01/16/22 0814)   azithromycin Stopped (01/16/22 2214)   ceFEPime (MAXIPIME) IV Stopped (01/16/22 2315)   vancomycin 1,250 mg (01/17/22 0836)     LOS: 3 days   Time spent: 52 mins  Brielle Moro Wynetta Emery, MD How to contact the [facility] Attending or Consulting provider [place] or covering provider during after hours [place], for this patient?  Check the care team in [facility] and look for a) attending/consulting TR[facility] provider listed and b) the [facility] team listed Log  into [email or web address] and use [place]'s universal password to access. If you do not have the password, please contact the hospital operator. Locate the [facility] provider you are looking for under [facility] and page to a number that you can be directly reached. If you still have difficulty reaching the provider, please page the [facility] (Director on Call) for the Hospitalists listed on amion for assistance.  01/17/2022, 9:39 AM

## 2022-01-17 NOTE — Progress Notes (Signed)
Pt refuses nectar thick liquids and will only drink thin liquids, pt educated on why it is important for nectar thick liquids and preventing aspiration.

## 2022-01-17 NOTE — Evaluation (Signed)
Physical Therapy Evaluation Patient Details Name: Veronica Horton MRN: [id number] DOB: 1960/08/06 Today's Date: 01/17/2022  History of Present Illness  Veronica Horton is a 61 y.o. female with medical history significant of aspiration pneumonitis, bronchitis, class III obesity, COPD, DVT, hypertension, seizure, laryngeal cancer status post trach, presents to the ED from facility for vomiting.  Patient was recently discharged from the hospital on September 11.  She has been seen in the hospitals several times recently.  She was in [facility] are with lower extremity edema and pain.  She was discharged home from the ED with clindamycin for which she reports she finished the course.  She continued to have left lower extremity pain and edema.  During her hospitalization here she was started on vancomycin for lower extremity cellulitis.  Patient had a UA that was indicative of UTI and she was started on Rocephin to cover that an aspiration pneumonia.  Her hospitalization was complicated with fluid overload secondary to diastolic CHF for which she was started on IV Lasix and showed improvement.  She had an echo done that showed an ejection fraction of 65-70% as well.  Communication with patient is difficult given that she mouths words at you.  She reports she came in today because she could not breathe.  This started last night.  She has had increase in sputum production.  She denies chest pain.  Apparently was also reported to the ED staff the patient had more nausea and vomiting at the facility.  Patient denies any fevers.  Her only other complaint at this time is chronic back pain.   Clinical Impression  Patient demonstrates slow labored movement for sitting up at bedside with most difficulty moving legs due to weakness, very unsteady on feet and limited to 1 shuffling side steps due to weakness and poor carryover for keeping hands on RW.  Patient required Max assist to put back to bed.  Patient will benefit from  continued skilled physical therapy in hospital and recommended venue below to increase strength, balance, endurance for safe ADLs and gait.         Recommendations for follow up therapy are one component of a multi-disciplinary discharge planning process, led by the attending physician.  Recommendations may be updated based on patient status, additional functional criteria and insurance authorization.  Follow Up Recommendations Skilled nursing-short term rehab (<3 hours/day) Can patient physically be transported by private vehicle: No    Assistance Recommended at Discharge Intermittent Supervision/Assistance  Patient can return home with the following  A lot of help with bathing/dressing/bathroom;A lot of help with walking and/or transfers;Help with stairs or ramp for entrance;Assistance with cooking/housework    Equipment Recommendations None recommended by PT  Recommendations for Other Services       Functional Status Assessment Patient has had a recent decline in their functional status and demonstrates the ability to make significant improvements in function in a reasonable and predictable amount of time.     Precautions / Restrictions Precautions Precautions: Fall Restrictions Weight Bearing Restrictions: No      Mobility  Bed Mobility Overal bed mobility: Needs Assistance Bed Mobility: Supine to Sit, Sit to Supine     Supine to sit: Mod assist, Max assist Sit to supine: Max assist   General bed mobility comments: increased time, labored movement, required HOB raised    Transfers Overall transfer level: Needs assistance Equipment used: Rolling walker (2 wheels) Transfers: Sit to/from Stand Sit to Stand: Mod assist, Max assist  Ambulation/Gait Ambulation/Gait assistance: Max assist Gait Distance (Feet): 1 Feet Assistive device: Rolling walker (2 wheels) Gait Pattern/deviations: Decreased step length - right, Decreased step length - left,  Decreased stride length, Shuffle Gait velocity: slow     General Gait Details: limited to 1 shuffling step at bedside, unable to advance BLE due to weakness and poor carryover for holding onto RW  Stairs            Wheelchair Mobility    Modified Rankin (Stroke Patients Only)       Balance Overall balance assessment: Needs assistance Sitting-balance support: Feet supported, No upper extremity supported Sitting balance-Leahy Scale: Fair Sitting balance - Comments: seated at EOB   Standing balance support: Reliant on assistive device for balance, Bilateral upper extremity supported Standing balance-Leahy Scale: Poor Standing balance comment: using RW                             Pertinent Vitals/Pain Pain Assessment Pain Assessment: No/denies pain    Home Living Family/patient expects to be discharged to:: Private residence Living Arrangements: Spouse/significant other Available Help at Discharge: Friend(s);Available 24 hours/day;Family Type of Home: Mobile home Home Access: Ramped entrance       Home Layout: One level Home Equipment: Conservation officer, nature (2 wheels);Cane - single point;Wheelchair Probation officer (4 wheels)      Prior Function Prior Level of Function : Needs assist       Physical Assist : Mobility (physical);ADLs (physical) Mobility (physical): Bed mobility;Transfers;Gait;Stairs   Mobility Comments: short distanced household ambulator using Rollator ADLs Comments: assisted by family     Hand Dominance   Dominant Hand: Right    Extremity/Trunk Assessment   Upper Extremity Assessment Upper Extremity Assessment: Generalized weakness    Lower Extremity Assessment Lower Extremity Assessment: Generalized weakness    Cervical / Trunk Assessment Cervical / Trunk Assessment: Normal  Communication   Communication: Tracheostomy;Passy-Muir valve  Cognition Arousal/Alertness: Awake/alert Behavior During Therapy: WFL for tasks  assessed/performed Overall Cognitive Status: Within Functional Limits for tasks assessed                                          General Comments      Exercises     Assessment/Plan    PT Assessment Patient needs continued PT services  PT Problem List Decreased strength;Decreased activity tolerance;Decreased balance;Decreased mobility       PT Treatment Interventions DME instruction;Gait training;Stair training;Functional mobility training;Therapeutic activities;Therapeutic exercise;Patient/family education;Balance training    PT Goals (Current goals can be found in the Care Plan section)  Acute Rehab PT Goals Patient Stated Goal: return home with family to assist PT Goal Formulation: With patient Time For Goal Achievement: 01/31/22 Potential to Achieve Goals: Fair    Frequency Min 3X/week     Co-evaluation               AM-PAC PT "6 Clicks" Mobility  Outcome Measure Help needed turning from your back to your side while in a flat bed without using bedrails?: A Lot Help needed moving from lying on your back to sitting on the side of a flat bed without using bedrails?: A Lot Help needed moving to and from a bed to a chair (including a wheelchair)?: Total Help needed standing up from a chair using your arms (e.g., wheelchair or bedside chair)?: A Lot Help  needed to walk in hospital room?: Total Help needed climbing 3-5 steps with a railing? : Total 6 Click Score: 9    End of Session Equipment Utilized During Treatment: Oxygen Activity Tolerance: Patient tolerated treatment well;Patient limited by fatigue Patient left: in bed;with call bell/phone within reach Nurse Communication: Mobility status PT Visit Diagnosis: Unsteadiness on feet (R26.81);Other abnormalities of gait and mobility (R26.89);Muscle weakness (generalized) (M62.81)    Time: 5391-2258 PT Time Calculation (min) (ACUTE ONLY): 30 min   Charges:   PT Evaluation $PT Eval Moderate  Complexity: 1 Mod PT Treatments $Therapeutic Activity: 23-37 mins        3:57 PM, 01/17/22 Lonell Grandchild, MPT Physical Therapist with [facility] [phone] office [phone] mobile phone

## 2022-01-17 NOTE — TOC Progression Note (Addendum)
Transition of Care [facility]) - Progression Note    Patient Details  Name: LAVAEH BAU MRN: [id number] Date of Birth: 11-23-1960  Transition of [facility]) CM/SW Contact  Salome Arnt, [place] Phone Number: 01/17/2022, 7:59 AM  Clinical Narrative:  LCSW followed up with pt's significant other, Nori Riis regarding bed offers. Pt has bed offer at [facility] and [place]. Nori Riis reports they would like some time to think about it as they were hoping she could go to [place]. TOC will follow up when closer to d/c for decision.   *Addendum: Per palliative, pt/family agreeable to outpatient palliative follow up with [facility]. LCSW discussed with Nori Riis and he said he would like to think about this further as well and asked TOC to follow up tomorrow.     Expected Discharge Plan: [place] Barriers to Discharge: Continued Medical Work up  Expected Discharge Plan and Services Expected Discharge Plan: [place] In-house Referral: Clinical Social Work   Post Acute Care Choice: [place] Living arrangements for the past 2 months: [place] Determinants of Health (SDOH) Interventions    Readmission Risk Interventions    08/09/2021    2:37 PM 04/26/2020   10:28 AM  Readmission Risk Prevention Plan  Transportation Screening Complete Complete  Medication Review (RN Care Manager) Complete Complete  PCP or Specialist appointment within 3-5 days of discharge  Complete  HRI or [place] Complete Complete  SW Recovery Care/Counseling Consult Complete Complete  Palliative Care Screening Not Applicable Not [place] Not Applicable Patient Refused

## 2022-01-17 NOTE — Plan of Care (Signed)
  Problem: Acute Rehab PT Goals(only PT should resolve) Goal: Pt Will Go Supine/Side To Sit Outcome: Progressing Flowsheets (Taken 01/17/2022 1559) Pt will go Supine/Side to Sit: with moderate assist Goal: Patient Will Transfer Sit To/From Stand Outcome: Progressing Flowsheets (Taken 01/17/2022 1559) Patient will transfer sit to/from stand: with moderate assist Goal: Pt Will Transfer Bed To Chair/Chair To Bed Outcome: Progressing Flowsheets (Taken 01/17/2022 1559) Pt will Transfer Bed to Chair/Chair to Bed: with mod assist Goal: Pt Will Ambulate Outcome: Progressing Flowsheets (Taken 01/17/2022 1559) Pt will Ambulate:  10 feet  with moderate assist  with rolling walker   3:59 PM, 01/17/22 Lonell Grandchild, MPT Physical Therapist with [facility] [phone] office [phone] mobile phone

## 2022-01-17 NOTE — Progress Notes (Signed)
Pt has removed trach collar off multiple times, pt desats into the 70s, pt educated about leaving trach collar on, nurse asked pt why she continuously removes oxygen and pt states "I don't know"

## 2022-01-17 NOTE — Assessment & Plan Note (Signed)
--   continue IV lasix for diuresis, recheck pCXR in AM

## 2022-01-18 ENCOUNTER — Inpatient Hospital Stay (HOSPITAL_COMMUNITY): Payer: Medicaid Other

## 2022-01-18 ENCOUNTER — Other Ambulatory Visit: Payer: Self-pay

## 2022-01-18 DIAGNOSIS — I5031 Acute diastolic (congestive) heart failure: Secondary | ICD-10-CM

## 2022-01-18 DIAGNOSIS — E876 Hypokalemia: Secondary | ICD-10-CM | POA: Diagnosis not present

## 2022-01-18 DIAGNOSIS — R1310 Dysphagia, unspecified: Secondary | ICD-10-CM | POA: Diagnosis not present

## 2022-01-18 DIAGNOSIS — Z7189 Other specified counseling: Secondary | ICD-10-CM | POA: Diagnosis not present

## 2022-01-18 DIAGNOSIS — T17908D Unspecified foreign body in respiratory tract, part unspecified causing other injury, subsequent encounter: Secondary | ICD-10-CM

## 2022-01-18 DIAGNOSIS — J9601 Acute respiratory failure with hypoxia: Secondary | ICD-10-CM | POA: Diagnosis not present

## 2022-01-18 DIAGNOSIS — Z515 Encounter for palliative care: Secondary | ICD-10-CM | POA: Diagnosis not present

## 2022-01-18 LAB — BASIC METABOLIC PANEL
Anion gap: 9 (ref 5–15)
Anion gap: 13 (ref 5–15)
BUN: 23 mg/dL (ref 8–23)
BUN: 24 mg/dL — ABNORMAL HIGH (ref 8–23)
CO2: 28 mmol/L (ref 22–32)
CO2: 26 mmol/L (ref 22–32)
Calcium: 8.7 mg/dL — ABNORMAL LOW (ref 8.9–10.3)
Calcium: 8.8 mg/dL — ABNORMAL LOW (ref 8.9–10.3)
Chloride: 105 mmol/L (ref 98–111)
Chloride: 104 mmol/L (ref 98–111)
Creatinine, Ser: 1.97 mg/dL — ABNORMAL HIGH (ref 0.44–1.00)
Creatinine, Ser: 2.1 mg/dL — ABNORMAL HIGH (ref 0.44–1.00)
GFR, Estimated: 28 mL/min — ABNORMAL LOW (ref >60)
GFR, Estimated: 26 mL/min — ABNORMAL LOW (ref >60)
Glucose, Bld: 84 mg/dL (ref 70–99)
Glucose, Bld: 76 mg/dL (ref 70–99)
Potassium: 3.2 mmol/L — ABNORMAL LOW (ref 3.5–5.1)
Potassium: 3 mmol/L — ABNORMAL LOW (ref 3.5–5.1)
Sodium: 142 mmol/L (ref 135–145)
Sodium: 143 mmol/L (ref 135–145)

## 2022-01-18 LAB — CBC
HCT: 31.9 % — ABNORMAL LOW (ref 36.0–46.0)
Hemoglobin: 9.5 g/dL — ABNORMAL LOW (ref 12.0–15.0)
MCH: 28.5 pg (ref 26.0–34.0)
MCHC: 29.8 g/dL — ABNORMAL LOW (ref 30.0–36.0)
MCV: 95.8 fL (ref 80.0–100.0)
Platelets: 325 10*3/uL (ref 150–400)
RBC: 3.33 MIL/uL — ABNORMAL LOW (ref 3.87–5.11)
RDW: 20.2 % — ABNORMAL HIGH (ref 11.5–15.5)
WBC: 5.5 10*3/uL (ref 4.0–10.5)
nRBC: 0 % (ref 0.0–0.2)

## 2022-01-18 LAB — MAGNESIUM: Magnesium: 2 mg/dL (ref 1.7–2.4)

## 2022-01-18 LAB — LEGIONELLA PNEUMOPHILA SEROGP 1 UR AG: L. pneumophila Serogp 1 Ur Ag: NEGATIVE

## 2022-01-18 MED ORDER — VANCOMYCIN HCL 1500 MG/300ML IV SOLN
1500.0000 mg | INTRAVENOUS | Status: DC
  Administered 2022-01-19: 1500 mg via INTRAVENOUS
  Filled 2022-01-18: qty 300

## 2022-01-18 MED ORDER — LEVETIRACETAM 500 MG/5ML IV SOLN
INTRAVENOUS | Status: AC
  Filled 2022-01-18: qty 10

## 2022-01-18 MED ORDER — POTASSIUM CHLORIDE CRYS ER 20 MEQ PO TBCR: 40.0000 meq | EXTENDED_RELEASE_TABLET | Freq: Once | ORAL | Status: DC

## 2022-01-18 MED ORDER — CHLORHEXIDINE GLUCONATE CLOTH 2 % EX PADS
6.0000 | MEDICATED_PAD | Freq: Every day | CUTANEOUS | Status: DC
  Administered 2022-01-19 – 2022-01-21 (×3): 6 via TOPICAL

## 2022-01-18 MED ORDER — POTASSIUM CHLORIDE 10 MEQ/100ML IV SOLN
10.0000 meq | INTRAVENOUS | Status: AC
  Administered 2022-01-19 (×4): 10 meq via INTRAVENOUS
  Filled 2022-01-18 (×4): qty 100

## 2022-01-18 MED ORDER — ENOXAPARIN SODIUM 120 MG/0.8ML IJ SOSY
1.0000 mg/kg | PREFILLED_SYRINGE | Freq: Two times a day (BID) | INTRAMUSCULAR | Status: DC
  Administered 2022-01-19 – 2022-01-20 (×5): 120 mg via SUBCUTANEOUS
  Filled 2022-01-18 (×5): qty 0.8

## 2022-01-18 MED ORDER — SODIUM CHLORIDE 0.9 % IV SOLN
750.0000 mg | Freq: Two times a day (BID) | INTRAVENOUS | Status: DC
  Administered 2022-01-18 – 2022-01-20 (×5): 750 mg via INTRAVENOUS
  Filled 2022-01-18 (×8): qty 7.5

## 2022-01-18 MED ORDER — FUROSEMIDE 10 MG/ML IJ SOLN
40.0000 mg | Freq: Every day | INTRAMUSCULAR | Status: DC
  Administered 2022-01-18 – 2022-01-20 (×3): 40 mg via INTRAVENOUS
  Filled 2022-01-18 (×3): qty 4

## 2022-01-18 NOTE — Progress Notes (Incomplete)
Patient continues to thrash against side of bed, cuss, and threaten staff. Patient has removed or dislodged every peripheral IV. Multiple attempts have been made to regain IV access. ED and [facility] called to try and come attempt IV. Patient is in bilateral wrist restraints due to patient pulling out trach and pulling off trach collar which caused patient to desaturate into the 60s but recovered after. MD is aware of that. Potassium was not replenished during day. Redraw orders were placed. Potassium was 3.0 and Magnesium was 2.0. See new orders for replacement. Electrolytes will be replenished as soon as IV access is obtained. MD also aware of agitation and stated that new orders would be placed after completion of electrolyte replacement for that. Patient continues to bang against side of bed causing bleeding and abrasions on arms.

## 2022-01-18 NOTE — Progress Notes (Signed)
Pharmacy Antibiotic Note  Veronica Horton is a 61 y.o. female admitted on 01/14/2022 with pneumonia.  Pharmacy has been consulted for vancomcyin dosing.  Plan: Vancomcyin  '1500mg'$  IV q48 hours ([id number]) Cefepime 2000 mg IV every 12 hours.  Height: 5' 5.5" (166.4 cm) Weight: 120.9 kg (266 lb 8.6 oz) IBW/kg (Calculated) : 58.15  Temp (24hrs), Avg:98 F (36.7 C), Min:97.5 F (36.4 C), Max:98.7 F (37.1 C)  Recent Labs  Lab 01/14/22 2006 01/15/22 0415 01/18/22 0444  WBC 8.8 8.9 5.5  CREATININE 2.44* 2.21* 1.97*     Estimated Creatinine Clearance: 38.9 mL/min (A) (by C-G formula based on SCr of 1.97 mg/dL (H)).    Allergies  Allergen Reactions   Codeine Hives and Itching    Reports itching only per RN   Doxycycline Hives   Penicillins Hives    Did it involve swelling of the face/tongue/throat, SOB, or low BP? No Did it involve sudden or severe rash/hives, skin peeling, or any reaction on the inside of your mouth or nose? Yes Did you need to seek medical attention at a hospital or doctor's office? Unknown When did it last happen?      Over 10 years If all above answers are "NO", may proceed with cephalosporin use.     Antimicrobials this admission: Vancomycin 9/17>> Cefepime 9/16>>  Azithromycin 9/16 >> 9/20  Microbiology results: 9/16 BCx: ngtd Sputum Cx: pending 9/16 MRSA PCR: +  Thank you for allowing pharmacy to be a part of this patient's care.  Ramond Craver 01/18/2022 3:02 PM

## 2022-01-18 NOTE — Progress Notes (Signed)
Patient pulled her trach out again for the second time in 2 days.  Her trach site is red and irritated.  Able to get trach in with obturator with no problem and ETCO2 and BS confirm placement.  Placed back on Trach Collar and suctioned for large amounts of thick, yellow secretions.  Put new inner cannula in.  Will continue to monitor pt.

## 2022-01-18 NOTE — Progress Notes (Signed)
Palliative:   [name], Veronica Horton, is lying quietly in bed.  She appears acutely/chronically ill, obese.  Her eyes are half closed, she will not make eye contact unless asks.  It is difficult to determine orientation.  When seen Monday she was able to clearly mouth words that were intelligible.  She looks weaker than when seen Monday.  I am not sure that she can make her basic needs known.  There is no family present at bedside at this time.  Nursing staff is available to assist.  Worker from [employer] is present for evaluation of STR/LTC.  Veronica Horton and I talk about her acute illness.  I ask how she is doing, and she is noncommittal.  I ask if she wants [place] to continue to do these things to her, especially in light of her removing trach collar and inner cannula of tracheostomy.  She looks at me and mouths, "I love you".  I shared that if she does not want to continue in her current state she does not have to, we would care for her.  She again mouth, "I love you", and I responded that I love her also.   I ask if she has known anyone who was on hospice care.  She mouths something, but I am unable to discern.  I ask if she is ready for hospice care again she mouths something that I am not able to understand.  I ask again if she is ready for hospice care, asking that she shake her head yes or no.  She purposefully does not neither.  I reassure her that we are continuing to care for her.  I shared that we will reach out to her sister who she names as her healthcare surrogate.  Call to sister, Veronica Horton.  No answer, unable to leave voicemail message.  Conference with attending, bedside nursing staff, transition care team related to patient condition, needs, goals of care, disposition.  Plan: At this point continue to treat the treatable but no CPR or intubation.  Time for outcomes.  Would benefit from goals of care discussion face-to-face with sister. Prognosis: Guarded at this point.  In hospital death would  not be surprising if she continues to remove her oxygen.     28 minutes  Veronica Axe, NP Palliative medicine team Team phone [phone] Greater than 50% of this time was spent counseling and coordinating care related to the above assessment and plan.

## 2022-01-18 NOTE — Progress Notes (Signed)
ANTICOAGULATION CONSULT NOTE - Initial Consult  Pharmacy Consult for Lovenox Indication: DVT  Allergies  Allergen Reactions   Codeine Hives and Itching    Reports itching only per RN   Doxycycline Hives   Penicillins Hives    Did it involve swelling of the face/tongue/throat, SOB, or low BP? No Did it involve sudden or severe rash/hives, skin peeling, or any reaction on the inside of your mouth or nose? Yes Did you need to seek medical attention at a hospital or doctor's office? Unknown When did it last happen?      Over 10 years If all above answers are "NO", may proceed with cephalosporin use.     Patient Measurements: Height: 5' 5.5" (166.4 cm) Weight: 120.9 kg (266 lb 8.6 oz) IBW/kg (Calculated) : 58.15  Vital Signs: Temp: 96.1 F (35.6 C) (09/20 1940) Temp Source: Axillary (09/20 1940) BP: 145/55 (09/20 1900) Pulse Rate: 60 (09/20 1900)  Labs: Recent Labs    01/18/22 0444  HGB 9.5*  HCT 31.9*  PLT 325  CREATININE 1.97*    Estimated Creatinine Clearance: 38.9 mL/min (A) (by C-G formula based on SCr of 1.97 mg/dL (H)).   Medical History: Past Medical History:  Diagnosis Date   Aspiration pneumonitis ([place]) 01/17/2019   Bronchitis    Class 3 obesity ([place]) 01/23/2020   COPD (chronic obstructive pulmonary disease) (HCC)    Degenerative disc disease, lumbar    DVT (deep venous thrombosis) ([place]) 08/08/2021   Hiatal hernia    Hypertension    Seizure ([place]) 11/28/2020   Small bowel obstruction ([place]) 01/13/2019   Throat cancer ([place])     Medications:  Medications Prior to Admission  Medication Sig Dispense Refill Last Dose   apixaban (ELIQUIS) 5 MG TABS tablet Take 1 tablet (5 mg total) by mouth 2 (two) times daily. Restart taking only after next Monday(8/22) (Patient taking differently: Take 5 mg by mouth 2 (two) times daily.) 60 tablet 1 Past Week at unk   budesonide (PULMICORT) 0.5 MG/2ML nebulizer solution Take 2 mLs (0.5 mg total) by nebulization 2 (two)  times daily. 120 mL 2 Past Week   calcitRIOL (ROCALTROL) 0.25 MCG capsule Take 0.25 mcg by mouth daily.   Past Week   D3 SUPER STRENGTH 50 MCG (2000 UT) CAPS Take 2,000 Units by mouth daily.   Past Week   escitalopram (LEXAPRO) 5 MG tablet Place 1 tablet (5 mg total) into feeding tube daily. 30 tablet 2 Past Week   folic acid (FOLVITE) 1 MG tablet Place 1 tablet (1 mg total) into feeding tube daily. 30 tablet 2 Past Week   furosemide (LASIX) 40 MG tablet Take 1 tablet (40 mg total) by mouth 2 (two) times daily. 60 tablet 1 Past Week   gabapentin (NEURONTIN) 300 MG capsule Place 2 capsules (600 mg total) into feeding tube at bedtime. (Patient taking differently: Place 300 mg into feeding tube 3 (three) times daily as needed (nerve pain).)   unk   levETIRAcetam (KEPPRA) 100 MG/ML solution Place 7.5 mLs (750 mg total) into feeding tube 2 (two) times daily. 473 mL 0 Past Week   levothyroxine (SYNTHROID) 150 MCG tablet Take 150 mcg by mouth daily.   Past Week   metoprolol tartrate (LOPRESSOR) 25 MG tablet Take 0.5 tablets (12.5 mg total) by mouth 2 (two) times daily. 60 tablet 1 Past Week at unk   midodrine (PROAMATINE) 5 MG tablet Place 1 tablet (5 mg total) into feeding tube 3 (three) times daily with meals.  90 tablet 1 Past Week   Nystatin (GERHARDT'S BUTT CREAM) CREA Apply 1 application. topically 3 (three) times daily as needed for irritation. 1 each 2 Past Month   oxyCODONE (OXY IR/ROXICODONE) 5 MG immediate release tablet Take 5 mg by mouth every 6 (six) hours as needed for moderate pain.   Past Week   polyethylene glycol powder (GLYCOLAX/MIRALAX) 17 GM/SCOOP powder Dissolve 1 capful (17 g) in liquid and add into feeding tube daily as needed for moderate constipation. 510 g 0 Past Month   potassium chloride 20 MEQ/15ML (10%) SOLN Take 15 mLs by mouth daily.   Past Week   VENTOLIN HFA 108 (90 Base) MCG/ACT inhaler Inhale 1 puff into the lungs every 4 (four) hours as needed for wheezing or shortness  of breath.   unk   vitamin B-12 (CYANOCOBALAMIN) 1000 MCG tablet Place 0.5 tablets (500 mcg total) into feeding tube daily. 30 tablet 3 Past Week   cefdinir (OMNICEF) 300 MG capsule Take 1 capsule (300 mg total) by mouth every 12 (twelve) hours. (Patient not taking: Reported on 01/15/2022) 6 capsule 0 Completed Course   clindamycin (CLEOCIN) 300 MG capsule Take 1 capsule (300 mg total) by mouth every 6 (six) hours. (Patient not taking: Reported on 01/15/2022) 9 capsule 0 Completed Course   ipratropium-albuterol (DUONEB) 0.5-2.5 (3) MG/3ML SOLN Take 3 mLs by nebulization every 6 (six) hours as needed. (Patient not taking: Reported on 01/15/2022) 360 mL 2 Not Taking   lidocaine (LIDODERM) 5 % Place 1 patch onto the skin daily. Remove & Discard patch within 12 hours or as directed by MD (Patient not taking: Reported on 01/15/2022) 30 patch 0 Not Taking   magic mouthwash w/lidocaine SOLN Take 10 mLs by mouth 3 (three) times daily as needed for mouth pain. SWISH AND SPIT. DO NOT SWALLOW. (Patient not taking: Reported on 01/15/2022) 360 mL 0 Not Taking   Scheduled:   budesonide  0.5 mg Nebulization BID   calcitRIOL  0.25 mcg Oral Daily   [COMPLETED] Chlorhexidine Gluconate Cloth  6 each Topical Q0600   [START ON 01/19/2022] Chlorhexidine Gluconate Cloth  6 each Topical Daily   citalopram  20 mg Oral Daily   folic acid  1 mg Oral Daily   furosemide  40 mg Intravenous Daily   ipratropium-albuterol  3 mL Nebulization Q6H   levothyroxine  150 mcg Oral Daily   metoprolol tartrate  12.5 mg Oral BID   midodrine  5 mg Oral TID WC   mupirocin ointment  1 Application Nasal BID   cyanocobalamin  500 mcg Oral Daily    Assessment: 14 yof admitted for vomiting. Changing to Lovenox from PTA Eliquis 5 mg BID, last dose 9/19 ~2200. Eliquis PTA for history of DVT in 03/2020. Patient weighs ~121 kg, BMI 43.68. Renal function is poor but improving. eCrCl today is ~39 ml/min with creatinine down to 1.97.  Goal of Therapy:   Anti-Xa level 0.6-1 units/ml 4hrs after LMWH dose given -- consider checking if on for a prolonged time.  Monitor platelets by anticoagulation protocol: Yes   Plan:  Lovenox 120 mg [place] BID  Monitor Q3 day CBC and renal function  F/u need for anti-Xa level  F/u ability to tolerate orals and changing back to Eliquis   Eddie Candle, PharmD, BCPS, BCOP Clinical Pharmacist 01/18/2022,9:51 PM

## 2022-01-18 NOTE — Progress Notes (Signed)
PROGRESS NOTE  Veronica Horton WUX:[id number] DOB: 21-Sep-1960 DOA: 01/14/2022 PCP: [facility]  Brief History:  61 y.o. female with medical history significant of aspiration pneumonitis, bronchitis, class III obesity, COPD, DVT, hypertension, seizure, laryngeal cancer status post trach, presents to the ED from facility for vomiting.  Patient was recently discharged from the hospital on September 11.  She has been seen in the hospitals several times recently.  She was in [facility] are with lower extremity edema and pain.  She was discharged home from the ED with clindamycin for which she reports she finished the course.  She continued to have left lower extremity pain and edema.  During her hospitalization here she was started on vancomycin for lower extremity cellulitis.  Patient had a UA that was indicative of UTI and she was started on Rocephin to cover that an aspiration pneumonia.  Her hospitalization was complicated with fluid overload secondary to diastolic CHF for which she was started on IV Lasix and showed improvement.  She had an echo done that showed an ejection fraction of 65-70% as well.  Communication with patient is difficult given that she mouths words at you.  She reports she came in today because she could not breathe.  This started last night.  She has had increase in sputum production.  She denies chest pain.  Apparently was also reported to the ED staff the patient had more nausea and vomiting at the facility.  Patient denies any fevers.  Her only other complaint at this time is chronic back pain.   Patient is DNR, not getting treatment for her laryngeal cancer, and following with palliative.   Assessment/Plan: Acute on chronic respiratory failure with hypoxia and hypercarbia -Multifactorial including aspiration pneumonitis, COPD, fluid overload -The patient is noncompliant with her prescribed diet -Currently stable on 8 L -She is chronically on 8 L  at home  Aspiration pneumonitis ([place]) CT chest on 01/04/2022 showedMultifocal pneumonia with acute on chronic sequela of aspiration Continue ceftriaxone>>cefdinir>>home with 3 more days Speech therapy eval>>dys 3 with thin  Acute on chronic diastolic CHF (congestive heart failure) (HCC) Clinically fluid overloaded initially with pleural effusions and interstitial edema Continue po lasix for now>>if refuses, transition to IV lasix --9/10 echo--EF 65 to 70%, no WMA. Normal RV; trivial TR  Hypokalemia -replete  Acute renal failure superimposed on stage 3b chronic kidney disease (HCC) Baseline creatinine 1.6-1.8 Presented with serum creatinine 2.44 Now fluid overloaded>>started IV lasix initially   Laryngeal cancer ([place]) - Patient has expressed not interested in further evaluation or treatment for the cancer -Palliative has seen her in the outpatient setting and planning to establish care more consistently -She is DNR.   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    Hypertension -Stable overall -Holding home antihypertensive medications secondary to soft BP  Family Communication:  sister/significant other updated 9/20  Consultants:  palliative, pulm  Code Status:  DNR  DVT Prophylaxis: apixaban   Procedures: As Listed in Progress Note Above  Antibiotics: None Cefepime 9/16>> Vanc 9/17>>      Subjective: Patient denies any chest pain or shortness of breath.  Review systems is limited secondary to her confusion.  She denies any abdominal pain.  Objective: Vitals:  01/18/22 1000 01/18/22 1100 01/18/22 1153 01/18/22 1200  BP: 116/62 (!) 121/57 (!) 121/57 (!) 117/54  Pulse: (!) 58 (!) 55 71 67  Resp: 11 16 (!) 22 14  Temp:      TempSrc:       SpO2: 98% 99% 96% 96%  Weight:      Height:        Intake/Output Summary (Last 24 hours) at 01/18/2022 1433 Last data filed at 01/18/2022 0500 Gross per 24 hour  Intake 376.8 ml  Output 800 ml  Net -423.2 ml   Weight change: -2.4 kg Exam:  General:  Pt is alert, does not follow commands appropriately, not in acute distress HEENT: No icterus, No thrush, No neck mass, [place]/AT Cardiovascular: RRR, S1/S2, no rubs, no gallops Respiratory: bilateral rales.  Bibasilar wheeze Abdomen: Soft/+BS, non tender, non distended, no guarding Extremities: trace LE edema, No lymphangitis, No petechiae, No rashes, no synovitis   Data Reviewed: I have personally reviewed following labs and imaging studies Basic Metabolic Panel: Recent Labs  Lab 01/14/22 2006 01/15/22 0415 01/18/22 0444  NA 139 137 142  K 3.4* 3.9 3.2*  CL 99 100 105  CO2 '30 27 28  '$ GLUCOSE 94 92 84  BUN 25* 24* 23  CREATININE 2.44* 2.21* 1.97*  CALCIUM 8.7* 8.1* 8.7*  MG  --  2.2  --    Liver Function Tests: Recent Labs  Lab 01/15/22 0415  AST 15  ALT 17  ALKPHOS 88  BILITOT 0.6  PROT 6.4*  ALBUMIN 2.5*   No results for input(s): "LIPASE", "AMYLASE" in the last 168 hours. No results for input(s): "AMMONIA" in the last 168 hours. Coagulation Profile: No results for input(s): "INR", "PROTIME" in the last 168 hours. CBC: Recent Labs  Lab 01/14/22 2006 01/15/22 0415 01/18/22 0444  WBC 8.8 8.9 5.5  NEUTROABS 7.2 7.1  --   HGB 9.7* 9.0* 9.5*  HCT 31.4* 30.3* 31.9*  MCV 93.5 95.3 95.8  PLT 406* 366 325   Cardiac Enzymes: No results for input(s): "CKTOTAL", "CKMB", "CKMBINDEX", "TROPONINI" in the last 168 hours. BNP: Invalid input(s): "POCBNP" CBG: No results for input(s): "GLUCAP" in the last 168 hours. HbA1C: No results for input(s): "HGBA1C" in the last 72 hours. Urine analysis:    Component Value Date/Time   COLORURINE YELLOW 01/03/2022 1820   APPEARANCEUR HAZY (A) 01/03/2022 1820   LABSPEC 1.012  01/03/2022 1820   PHURINE 5.0 01/03/2022 1820   GLUCOSEU NEGATIVE 01/03/2022 1820   HGBUR LARGE (A) 01/03/2022 1820   BILIRUBINUR NEGATIVE 01/03/2022 1820   KETONESUR NEGATIVE 01/03/2022 1820   PROTEINUR 30 (A) 01/03/2022 1820   UROBILINOGEN 0.2 09/25/2010 0530   NITRITE POSITIVE (A) 01/03/2022 1820   LEUKOCYTESUR LARGE (A) 01/03/2022 1820   Sepsis Labs: '@LABRCNTIP'$ (procalcitonin:4,lacticidven:4) ) Recent Results (from the past 240 hour(s))  Resp Panel by RT-PCR (Flu A&B, Covid) Anterior Nasal Swab     Status: None   Collection Time: 01/14/22  9:03 PM   Specimen: Anterior Nasal Swab  Result Value Ref Range Status   SARS Coronavirus 2 by RT PCR NEGATIVE NEGATIVE Final    Comment: (NOTE) SARS-CoV-2 target nucleic acids are NOT DETECTED.  The SARS-CoV-2 RNA is generally detectable in upper respiratory specimens during the acute phase of infection. The lowest concentration of SARS-CoV-2 viral copies this assay can detect is 138 copies/mL. A negative result does not preclude SARS-Cov-2 infection and should not be used as the sole basis for treatment or other patient management decisions.  A negative result may occur with  improper specimen collection/handling, submission of specimen other than nasopharyngeal swab, presence of viral mutation(s) within the areas targeted by this assay, and inadequate number of viral copies(<138 copies/mL). A negative result must be combined with clinical observations, patient history, and epidemiological information. The expected result is Negative.  Fact Sheet for Patients:  EntrepreneurPulse.com.au  Fact Sheet for Healthcare Providers:  IncredibleEmployment.be  This test is no t yet approved or cleared by the [place] FDA and  has been authorized for detection and/or diagnosis of SARS-CoV-2 by FDA under an Emergency Use Authorization (EUA). This EUA will remain  in effect (meaning this test can be used)  for the duration of the COVID-19 declaration under Section 564(b)(1) of the Act, 21 U.S.C.section 360bbb-3(b)(1), unless the authorization is terminated  or revoked sooner.       Influenza A by PCR NEGATIVE NEGATIVE Final   Influenza B by PCR NEGATIVE NEGATIVE Final    Comment: (NOTE) The Xpert Xpress SARS-CoV-2/FLU/RSV plus assay is intended as an aid in the diagnosis of influenza from Nasopharyngeal swab specimens and should not be used as a sole basis for treatment. Nasal washings and aspirates are unacceptable for Xpert Xpress SARS-CoV-2/FLU/RSV testing.  Fact Sheet for Patients: EntrepreneurPulse.com.au  Fact Sheet for Healthcare Providers: IncredibleEmployment.be  This test is not yet approved or cleared by the [place] FDA and has been authorized for detection and/or diagnosis of SARS-CoV-2 by FDA under an Emergency Use Authorization (EUA). This EUA will remain in effect (meaning this test can be used) for the duration of the COVID-19 declaration under Section 564(b)(1) of the Act, 21 U.S.C. section 360bbb-3(b)(1), unless the authorization is terminated or revoked.  Performed at [facility], [address], [place], [place] [address]   Culture, blood (routine x 2) Call MD if unable to obtain prior to antibiotics being given     Status: None (Preliminary result)   Collection Time: 01/14/22 10:13 PM   Specimen: BLOOD RIGHT HAND  Result Value Ref Range Status   Specimen Description BLOOD RIGHT HAND BOTTLES DRAWN AEROBIC ONLY  Final   Special Requests   Final    Blood Culture results may not be optimal due to an inadequate volume of blood received in culture bottles   Culture   Final    NO GROWTH 4 DAYS Performed at [facility], [address], [place], [place] [address]    Report Status PENDING  Incomplete  Culture, blood (routine x 2) Call MD if unable to obtain prior to antibiotics being given     Status: None (Preliminary  result)   Collection Time: 01/14/22 10:22 PM   Specimen: Left Antecubital; Blood  Result Value Ref Range Status   Specimen Description   Final    LEFT ANTECUBITAL BOTTLES DRAWN AEROBIC AND ANAEROBIC   Special Requests Blood Culture adequate volume  Final   Culture   Final    NO GROWTH 4 DAYS Performed at [facility], [address] [place], [place] [address]    Report Status PENDING  Incomplete  MRSA Next Gen by PCR, Nasal     Status: Abnormal   Collection Time: 01/14/22 11:19 PM   Specimen: Nasal Mucosa; Nasal Swab  Result Value Ref Range Status   MRSA by PCR Next Gen DETECTED (A) NOT DETECTED Final    Comment: RESULT CALLED TO, READ BACK BY AND VERIFIED WITH: WHITE,D @ 0102 ON 01/15/22 BY JUW Performed at [facility], [address], [place],  [place] [address]   Culture, Respiratory w Gram Stain     Status: None (Preliminary result)   Collection Time: 01/17/22 11:45 AM   Specimen: Tracheal Aspirate; Respiratory  Result Value Ref Range Status   Specimen Description   Final    TRACHEAL ASPIRATE Performed at [facility], [address], [place], [place] [address]    Special Requests   Final    NONE Performed at [facility], [address], [place], [place] [address]    Gram Stain   Final    WBC PRESENT, PREDOMINANTLY MONONUCLEAR NO ORGANISMS SEEN    Culture   Final    CULTURE REINCUBATED FOR BETTER GROWTH Performed at [facility], [place] [address] [place], [place] [address]    Report Status PENDING  Incomplete     Scheduled Meds:  apixaban  5 mg Oral BID   budesonide  0.5 mg Nebulization BID   calcitRIOL  0.25 mcg Oral Daily   Chlorhexidine Gluconate Cloth  6 each Topical Q0600   citalopram  20 mg Oral Daily   folic acid  1 mg Oral Daily   furosemide  40 mg Oral BID   ipratropium-albuterol  3 mL Nebulization Q6H   levETIRAcetam  750 mg Oral BID   levothyroxine  150 mcg Oral Daily   metoprolol tartrate  12.5 mg Oral BID   midodrine  5 mg Oral TID WC    mupirocin ointment  1 Application Nasal BID   sodium chloride HYPERTONIC  4 mL Nebulization BID   cyanocobalamin  500 mcg Oral Daily   Continuous Infusions:  sodium chloride Stopped (01/16/22 0814)   azithromycin 500 mg (01/17/22 2123)   ceFEPime (MAXIPIME) IV 2 g (01/18/22 0947)   vancomycin Stopped (01/17/22 1000)    Procedures/Studies: DG CHEST PORT 1 VIEW  Result Date: 01/18/2022 CLINICAL DATA:  [id number].  Shortness of breath. EXAM: PORTABLE CHEST 1 VIEW COMPARISON:  Portable 01/16/2022 at 8:37 a.m. FINDINGS: 4:56 a.m. An oxygen mask superimposes over the left apical area. There is a tracheostomy cannula with the tip 5.4 cm from the carina. There is a low inspiration. There is patchy consolidation, atelectasis or combination in the lower lung fields overlying small pleural effusions. The upper lung fields remain clear. No new or worsened opacity is seen. There is mild cardiomegaly without CHF. Stable mediastinum with mild aortic atherosclerosis. Degenerative change thoracic spine and asymmetrically of the right shoulder. IMPRESSION: There previously was interstitial edema which is not seen today. Bibasilar opacities, atelectasis and/or pneumonia, and small underlying pleural effusions appear similar. Overall aeration is unchanged. Electronically Signed   By: Telford Nab M.D.   On: 01/18/2022 06:02   DG Chest Port 1 View  Result Date: 01/16/2022 CLINICAL DATA:  Respiratory distress EXAM: PORTABLE CHEST 1 VIEW COMPARISON:  Chest radiograph 01/15/2019 FINDINGS: Increased bilateral pleural effusions, left-greater-than-right. Right basilar linear and left basilar consolidative opacities may represent atelectasis, but superimposed infection is difficult to exclude. Increased interstitial opacities and peribronchial wall thickening are suggestive of mild pulmonary edema. There is a tracheostomy in place. Visualized upper abdomen is unremarkable. No acute osseous abnormality. IMPRESSION: New mild  pulmonary edema with moderate left and small right pleural effusions. Bibasilar opacities may represent atelectasis in the setting of worsening pleural effusions, but a component of superimposed infection is difficult to exclude. Electronically Signed   By: Marin Roberts M.D.   On: 01/16/2022 09:01   DG Chest Port 1 View  Result Date: 01/14/2022 CLINICAL DATA:  sob  EXAM: PORTABLE CHEST 1 VIEW.  Patient is rotated. COMPARISON:  Chest x-ray 01/03/2022, CT chest 01/04/2022 FINDINGS: Tracheostomy tube with tip terminating 8.5 cm above the carina. The heart and mediastinal contours are grossly unchanged. Low lung volumes persistent retrocardiac airspace opacity. No pulmonary edema. Similar-appearing blunting of bilateral costophrenic angles with no significant pleural effusion. No pneumothorax. No acute osseous abnormality. IMPRESSION: 1. Low lung volumes, persistent retrocardiac airspace opacity with limited evaluation due to patient rotation. 2. Similar-appearing blunting of bilateral costophrenic angles with no significant pleural effusion. Electronically Signed   By: Iven Finn M.D.   On: 01/14/2022 19:49   ECHOCARDIOGRAM COMPLETE  Result Date: 01/07/2022    ECHOCARDIOGRAM REPORT   Patient Name:   MAKILA COLOMBE Date of Exam: 01/07/2022 Medical Rec #:  [id number]       Height:       65.5 in Accession #:    [id number]      Weight:       255.0 lb Date of Birth:  29-Aug-1960       BSA:          2.206 m Patient Age:    59 years        BP:           99/48 mmHg Patient Gender: F               HR:           72 bpm. Exam Location:  [facility] Procedure: 2D Echo, Color Doppler, Cardiac Doppler and Intracardiac            Opacification Agent Indications:    E93.81 Acute diastolic (congestive) heart failure  History:        Patient has prior history of Echocardiogram examinations, most                 recent 04/19/2021. CHF, Arrythmias:Atrial Fibrillation; Risk                 Factors:Hypertension, Diabetes,  Dyslipidemia and Current Smoker.  Sonographer:    Raquel Sarna Senior RDCS Referring Phys: [phone] Loryn Haacke  Sonographer Comments: Technically difficult due to patient body habitus. Patient is morbidly obese. IMPRESSIONS  1. Left ventricular ejection fraction, by estimation, is 65 to 70%. The left ventricle has normal function. The left ventricle has no regional wall motion abnormalities. Left ventricular diastolic parameters were normal.  2. Right ventricular systolic function is normal. The right ventricular size is normal. Tricuspid regurgitation signal is inadequate for assessing PA pressure.  3. The mitral valve is normal in structure. No evidence of mitral valve regurgitation. No evidence of mitral stenosis.  4. The aortic valve was not well visualized. Aortic valve regurgitation is not visualized. No aortic stenosis is present.  5. The inferior vena cava is dilated in size with <50% respiratory variability, suggesting right atrial pressure of 15 mmHg. FINDINGS  Left Ventricle: Left ventricular ejection fraction, by estimation, is 65 to 70%. The left ventricle has normal function. The left ventricle has no regional wall motion abnormalities. Definity contrast agent was given IV to delineate the left ventricular  endocardial borders. The left ventricular internal cavity size was normal in size. There is no left ventricular hypertrophy. Left ventricular diastolic parameters were normal. Right Ventricle: The right ventricular size is normal. Right vetricular wall thickness was not well visualized. Right ventricular systolic function is normal. Tricuspid regurgitation signal is inadequate for assessing PA pressure. Left Atrium: Left atrial size was normal in size.  Right Atrium: Right atrial size was normal in size. Pericardium: There is no evidence of pericardial effusion. Mitral Valve: The mitral valve is normal in structure. No evidence of mitral valve regurgitation. No evidence of mitral valve stenosis. Tricuspid Valve:  The tricuspid valve is not well visualized. Tricuspid valve regurgitation is trivial. No evidence of tricuspid stenosis. Aortic Valve: The aortic valve was not well visualized. Aortic valve regurgitation is not visualized. No aortic stenosis is present. Aortic valve mean gradient measures 4.0 mmHg. Aortic valve peak gradient measures 9.3 mmHg. Aortic valve area, by VTI measures 2.33 cm. Pulmonic Valve: The pulmonic valve was not well visualized. Pulmonic valve regurgitation is not visualized. No evidence of pulmonic stenosis. Aorta: The aortic root is normal in size and structure. Venous: The inferior vena cava is dilated in size with less than 50% respiratory variability, suggesting right atrial pressure of 15 mmHg. IAS/Shunts: The interatrial septum was not well visualized.  LEFT VENTRICLE PLAX 2D LVIDd:         4.60 cm LVIDs:         2.50 cm LV PW:         0.80 cm LV IVS:        1.00 cm LVOT diam:     1.80 cm LV SV:         67 LV SV Index:   30 LVOT Area:     2.54 cm  RIGHT VENTRICLE RV S prime:     13.30 cm/s TAPSE (M-mode): 2.0 cm LEFT ATRIUM             Index        RIGHT ATRIUM           Index LA diam:        3.60 cm 1.63 cm/m   RA Area:     20.30 cm LA Vol (A2C):   45.4 ml 20.58 ml/m  RA Volume:   64.40 ml  29.20 ml/m LA Vol (A4C):   63.1 ml 28.61 ml/m LA Biplane Vol: 54.0 ml 24.48 ml/m  AORTIC VALVE AV Area (Vmax):    2.06 cm AV Area (Vmean):   2.15 cm AV Area (VTI):     2.33 cm AV Vmax:           152.23 cm/s AV Vmean:          92.193 cm/s AV VTI:            0.289 m AV Peak Grad:      9.3 mmHg AV Mean Grad:      4.0 mmHg LVOT Vmax:         123.00 cm/s LVOT Vmean:        78.000 cm/s LVOT VTI:          0.264 m LVOT/AV VTI ratio: 0.91  AORTA Ao Root diam: 3.00 cm Ao Asc diam:  2.90 cm  SHUNTS Systemic VTI:  0.26 m Systemic Diam: 1.80 cm Carlyle Dolly MD Electronically signed by Carlyle Dolly MD Signature Date/Time: 01/07/2022/3:23:07 PM    Final    CT CHEST WO CONTRAST  Result Date:  01/04/2022 CLINICAL DATA:  A 61 year old female with chronic dyspnea presents for evaluation in the setting of abnormal chest radiograph. EXAM: CT CHEST WITHOUT CONTRAST TECHNIQUE: Multidetector CT imaging of the chest was performed following the standard protocol without IV contrast. RADIATION DOSE REDUCTION: This exam was performed according to the departmental dose-optimization program which includes automated exposure control, adjustment of the mA and/or kV according to  patient size and/or use of iterative reconstruction technique. COMPARISON:  December of 2021 FINDINGS: Cardiovascular: Scattered aortic atherosclerosis. Three-vessel coronary artery disease. Normal heart size without pericardial effusion or nodularity. Aberrant RIGHT subclavian artery. Mild engorgement of central pulmonary vasculature. Limited assessment of cardiovascular structures given lack of intravenous contrast. Mediastinum/Nodes: Tracheostomy tube terminating in the proximal trachea. No signs of adenopathy in the chest. No acute process in the mediastinum. Esophagus is grossly normal. Lungs/Pleura: Patchy airspace disease in the chest in LEFT upper lobe, lingula, LEFT lower lobe and RIGHT lung base. Volume loss in the LEFT lung base associated also with consolidative changes and material in bronchial structures. No pneumothorax. Trace pleural effusions. Airways are otherwise patent. Upper Abdomen: Incidental imaging of upper abdominal contents without acute process to the extent evaluated. Musculoskeletal: Spinal degenerative changes. No acute or destructive bone process. Rib fractures of varying ages, no acute fractures most are chronic or subacute and without displacement. Fracture at the T9 level with 20-30% loss of height. Vacuum phenomenon within the fracture site. No surrounding stranding. T11 with loss of height as well. These have occurred since December of 2021. No listhesis. IMPRESSION: 1. Multifocal pneumonia with acute on  chronic sequela of aspiration favored at the LEFT lung base. 2. Age-indeterminate compression fractures potentially subacute/recent at the T9 level and T11 level are new since 2021. Correlate with any history of prior fracture to these areas and with any current symptoms. 3. Aortic atherosclerosis and coronary artery disease. Aortic Atherosclerosis (ICD10-I70.0). Electronically Signed   By: Zetta Bills M.D.   On: 01/04/2022 17:23   DG Chest Portable 1 View  Result Date: 01/03/2022 CLINICAL DATA:  Provided history: Shortness of breath. EXAM: PORTABLE CHEST 1 VIEW COMPARISON:  Prior chest radiographs 08/08/2021 and earlier. FINDINGS: A tracheostomy tube is noted. Cardiomegaly. Central pulmonary vascular congestion. Prominence of the interstitial lung markings bilaterally, compatible with pulmonary interstitial edema. Irregular and nodular opacities within the right lung base. The nodular opacities measure up to 16 mm. Irregular opacities within the left lung base. No evidence of pneumothorax. No acute bony abnormality identified. IMPRESSION: Cardiomegaly with pulmonary interstitial edema. Opacities within bilateral lung bases, which may reflect atelectasis and/or pneumonia. Superimposed more nodular opacities within the right lung base. A chest CT is recommended to better assess for possible pulmonary nodules at this site. Small left pleural effusion, which may be partially loculated. Electronically Signed   By: Kellie Simmering D.O.   On: 01/03/2022 14:50   US Venous Img Lower Bilateral  Result Date: 01/03/2022 CLINICAL DATA:  Bilateral open wounds. EXAM: BILATERAL LOWER EXTREMITY VENOUS DOPPLER ULTRASOUND TECHNIQUE: Gray-scale sonography with graded compression, as well as color Doppler and duplex ultrasound were performed to evaluate the lower extremity deep venous systems from the level of the common femoral vein and including the common femoral, femoral, profunda femoral, popliteal and calf veins including  the posterior tibial, peroneal and gastrocnemius veins when visible. The superficial great saphenous vein was also interrogated. Spectral Doppler was utilized to evaluate flow at rest and with distal augmentation maneuvers in the common femoral, femoral and popliteal veins. COMPARISON:  None Available. FINDINGS: RIGHT LOWER EXTREMITY Common Femoral Vein: No evidence of thrombus. Normal compressibility, respiratory phasicity and response to augmentation. Saphenofemoral Junction: No evidence of thrombus. Normal compressibility and flow on color Doppler imaging. Profunda Femoral Vein: No evidence of thrombus. Normal compressibility and flow on color Doppler imaging. Femoral Vein: No evidence of thrombus. Normal compressibility, respiratory phasicity and response to augmentation. Popliteal Vein: No  evidence of thrombus. Normal compressibility, respiratory phasicity and response to augmentation. Calf Veins: Limited evaluation. Superficial Great Saphenous Vein: Limited evaluation. Other Findings:  Large amount of subcutaneous edema. LEFT LOWER EXTREMITY Common Femoral Vein: No evidence of thrombus. Normal compressibility, respiratory phasicity and response to augmentation. Saphenofemoral Junction: No evidence of thrombus. Normal compressibility and flow on color Doppler imaging. Profunda Femoral Vein: No evidence of thrombus. Normal compressibility and flow on color Doppler imaging. Femoral Vein: No evidence of thrombus. Normal compressibility, respiratory phasicity and response to augmentation. Popliteal Vein: No evidence of thrombus. Normal compressibility, respiratory phasicity and response to augmentation. Calf Veins: Limited evaluation. Superficial Great Saphenous Vein: No evidence of thrombus. Normal compressibility. Other Findings:  Subcutaneous edema. IMPRESSION: No evidence of deep venous thrombosis in either lower extremity. However, the study has technical limitations due to body habitus and edema. Limited  evaluation the bilateral deep calf veins. Electronically Signed   By: Markus Daft M.D.   On: 01/03/2022 13:45    Orson Eva, DO  [facility]  If 7PM-7AM, please contact night-coverage [email or web address] Password [facility] 01/18/2022, 2:33 PM   LOS: 4 days

## 2022-01-19 ENCOUNTER — Inpatient Hospital Stay (HOSPITAL_COMMUNITY): Payer: Medicaid Other

## 2022-01-19 DIAGNOSIS — Z7189 Other specified counseling: Secondary | ICD-10-CM | POA: Diagnosis not present

## 2022-01-19 DIAGNOSIS — I5031 Acute diastolic (congestive) heart failure: Secondary | ICD-10-CM | POA: Diagnosis not present

## 2022-01-19 DIAGNOSIS — T17908D Unspecified foreign body in respiratory tract, part unspecified causing other injury, subsequent encounter: Secondary | ICD-10-CM | POA: Diagnosis not present

## 2022-01-19 DIAGNOSIS — N179 Acute kidney failure, unspecified: Secondary | ICD-10-CM | POA: Diagnosis not present

## 2022-01-19 DIAGNOSIS — N1832 Chronic kidney disease, stage 3b: Secondary | ICD-10-CM | POA: Diagnosis not present

## 2022-01-19 DIAGNOSIS — Z515 Encounter for palliative care: Secondary | ICD-10-CM | POA: Diagnosis not present

## 2022-01-19 DIAGNOSIS — J9601 Acute respiratory failure with hypoxia: Secondary | ICD-10-CM | POA: Diagnosis not present

## 2022-01-19 LAB — CULTURE, BLOOD (ROUTINE X 2)
Culture: NO GROWTH
Culture: NO GROWTH
Special Requests: ADEQUATE

## 2022-01-19 LAB — CBC
HCT: 29.2 % — ABNORMAL LOW (ref 36.0–46.0)
Hemoglobin: 9 g/dL — ABNORMAL LOW (ref 12.0–15.0)
MCH: 29.2 pg (ref 26.0–34.0)
MCHC: 30.8 g/dL (ref 30.0–36.0)
MCV: 94.8 fL (ref 80.0–100.0)
Platelets: 359 10*3/uL (ref 150–400)
RBC: 3.08 MIL/uL — ABNORMAL LOW (ref 3.87–5.11)
RDW: 20 % — ABNORMAL HIGH (ref 11.5–15.5)
WBC: 6.1 10*3/uL (ref 4.0–10.5)
nRBC: 0 % (ref 0.0–0.2)

## 2022-01-19 LAB — URINALYSIS, COMPLETE (UACMP) WITH MICROSCOPIC
Bilirubin Urine: NEGATIVE
Glucose, UA: NEGATIVE mg/dL
Ketones, ur: 5 mg/dL — AB
Leukocytes,Ua: NEGATIVE
Nitrite: NEGATIVE
Protein, ur: NEGATIVE mg/dL
Specific Gravity, Urine: 1.009 (ref 1.005–1.030)
pH: 5 (ref 5.0–8.0)

## 2022-01-19 LAB — CULTURE, RESPIRATORY W GRAM STAIN

## 2022-01-19 LAB — VITAMIN B12: Vitamin B-12: 1968 pg/mL — ABNORMAL HIGH (ref 180–914)

## 2022-01-19 LAB — BASIC METABOLIC PANEL
Anion gap: 10 (ref 5–15)
BUN: 22 mg/dL (ref 8–23)
CO2: 24 mmol/L (ref 22–32)
Calcium: 8.6 mg/dL — ABNORMAL LOW (ref 8.9–10.3)
Chloride: 107 mmol/L (ref 98–111)
Creatinine, Ser: 2.05 mg/dL — ABNORMAL HIGH (ref 0.44–1.00)
GFR, Estimated: 27 mL/min — ABNORMAL LOW (ref >60)
Glucose, Bld: 85 mg/dL (ref 70–99)
Potassium: 3.1 mmol/L — ABNORMAL LOW (ref 3.5–5.1)
Sodium: 141 mmol/L (ref 135–145)

## 2022-01-19 LAB — FOLATE: Folate: 31.4 ng/mL (ref >5.9)

## 2022-01-19 LAB — BLOOD GAS, VENOUS
Acid-Base Excess: 3.7 mmol/L — ABNORMAL HIGH (ref 0.0–2.0)
Bicarbonate: 27.7 mmol/L (ref 20.0–28.0)
Drawn by: 6000
O2 Saturation: 98.1 %
Patient temperature: 35.6
pCO2, Ven: 37 mmHg — ABNORMAL LOW (ref 44–60)
pH, Ven: 7.48 — ABNORMAL HIGH (ref 7.25–7.43)
pO2, Ven: 76 mmHg — ABNORMAL HIGH (ref 32–45)

## 2022-01-19 LAB — MAGNESIUM: Magnesium: 1.9 mg/dL (ref 1.7–2.4)

## 2022-01-19 LAB — TSH: TSH: 16.364 u[IU]/mL — ABNORMAL HIGH (ref 0.350–4.500)

## 2022-01-19 LAB — BRAIN NATRIURETIC PEPTIDE: B Natriuretic Peptide: 211 pg/mL — ABNORMAL HIGH (ref 0.0–100.0)

## 2022-01-19 LAB — T4, FREE: Free T4: 1.12 ng/dL (ref 0.61–1.12)

## 2022-01-19 LAB — AMMONIA: Ammonia: <10 umol/L (ref 9–35)

## 2022-01-19 MED ORDER — HALOPERIDOL LACTATE 5 MG/ML IJ SOLN
2.0000 mg | Freq: Once | INTRAMUSCULAR | Status: AC
  Administered 2022-01-19: 2 mg via INTRAMUSCULAR
  Filled 2022-01-19: qty 1

## 2022-01-19 NOTE — Progress Notes (Signed)
PROGRESS NOTE  Veronica Horton UJW:[id number] DOB: Jul 09, 1960 DOA: 01/14/2022 PCP: [facility]  Brief History:  61 year old female with a history of COPD, tobacco abuse, chronic respiratory failure with tracheostomy on 6-8 L at home, laryngeal cancer status post tracheostomy, seizure disorder, GERD, hypertension, orthostatic hypotension presenting with generalized weakness and increasing left lower extremity redness and pain.  The patient visited the emergency department at [facility] on 12/27/2026 with lower extremity edema and pain.  The patient was discharged home from the ED with clindamycin with which she had been compliant.  She continued to have left lower extremity pain and edema.  She stated that she had her left leg against the wall approximately 1 week prior to this admission.  She denies any other recent injuries or falls.  She uses a walker at baseline.  She continues to smoke 1/2 pack/day Although the patient did not formally leave [facility], she aggressively advocated for discharge on 01/09/2022. On 01/10/2022, the patient slid out of bed and went to the ED at [facility].  She refused any blood work and was subsequently discharged home. She presented again on 01/14/2022 with shortness of breath with nausea and vomiting.  Chest x-ray showed increased interstitial markings with moderate left pleural effusion and small right pleural effusion.  The patient was started on Lasix.  She was also started on vancomycin and cefepime for pneumonia.  Since admission to the hospital, the patient's condition has waxed and waned without much improvement.  Her mentation also has been waxing and waning.  Palliative medicine was consulted.  Pulmonary medicine was consulted.     Assessment and Plan: Acute on chronic respiratory failure with hypoxia and hypercarbia -Multifactorial including aspiration pneumonitis, COPD, fluid overload -The patient is noncompliant with her  prescribed diet -Currently stable on 8 L -She is chronically on 8 L at home   Aspiration pneumonitis ([place]) CT chest on 01/04/2022 showed Multifocal pneumonia with acute on chronic sequela of aspiration 01/16/22 Speech therapy eval>>dys 3 with thin -MRSA+ -on vanc/cefepime   Acute on chronic diastolic CHF (congestive heart failure) (HCC) Clinically fluid overloaded initially with pleural effusions and interstitial edema transition to IV lasix as pt intermittently refuses pills --9/10 echo--EF 65 to 70%, no WMA. Normal RV; trivial TR   Hypokalemia -replete -mag 1.9   Acute renal failure superimposed on stage 3b chronic kidney disease (HCC) Baseline creatinine 1.6-1.8 Presented with serum creatinine 2.44 Now fluid overloaded>>started IV lasix initially  Acute metabolic Encephalopathy -remains confused and agitated -obtain UA/culture -B12 1968 -folate 31.4 -TSH 16.36, Free T4 = 1.12 -ammonia <10 -VBG 7.48/37/76/27 -obtain UA -order CT brain   Laryngeal cancer [facility]) - Patient has expressed not interested in further evaluation or treatment for the cancer -Palliative has seen her in the outpatient setting and planning to establish care more consistently -She is DNR.   Tracheostomy status ([place]) CHRONIC RESPIRATORY FAILURE WITH HYPOXIA - ON 6-8 L AT [place] in place; adequate saturation -stable   Tobacco abuse Tobacco cessation discussed   Class 3 obesity (HCC) -Lifestyle modification -Body mass index is 41.79 kg/m.    Hypertension -Stable overall -Holding home antihypertensive medications secondary to soft BP  Goals of Care -discussed with son  -we discussed patient's overall declining medical condition despite optimal therapy.  We discussed that patient is worse than just a few weeks ago.  We discussed her overall poor prognosis in the setting of her  poor baseline function, numerous significant comorbidities and overall FTT and declining health condition.  He  agrees with DNR and open to pursue full comfort measure approach -son stated he will come to see patient on 9/23   Family Communication:  son updated 9/21   Consultants:  palliative, pulm   Code Status:  DNR   DVT Prophylaxis: apixaban     Procedures: As Listed in Progress Note Above   Antibiotics: None Cefepime 9/16>> Vanc 9/17>>     Total time spent 50 minutes.  Greater than 50% spent face to face counseling and coordinating care.     Subjective: Patient is confused and agitated.  ROS is limited due to encephalopathy  Objective: Vitals:   01/19/22 1050 01/19/22 1100 01/19/22 1400 01/19/22 1404  BP: (!) 99/44 (!) 110/52 (!) 114/57   Pulse: (!) 57 (!) 56 64   Resp: '13 13 12   '$ Temp:      TempSrc:      SpO2: 94% 95% 93% 96%  Weight:      Height:        Intake/Output Summary (Last 24 hours) at 01/19/2022 1501 Last data filed at 01/19/2022 0800 Gross per 24 hour  Intake 192.96 ml  Output 1050 ml  Net -857.04 ml   Weight change:  Exam:  General:  Pt is alert, fconfused, agitated HEENT: No icterus, No thrush, No neck mass, [place]/AT Cardiovascular: RRR, S1/S2, no rubs, no gallops Respiratory: bibasilar rales. No wheeze Abdomen: Soft/+BS, non tender, non distended, no guarding Extremities: trace LE edema, No lymphangitis, No petechiae, No rashes, no synovitis   Data Reviewed: I have personally reviewed following labs and imaging studies Basic Metabolic Panel: Recent Labs  Lab 01/14/22 2006 01/15/22 0415 01/18/22 0444 01/18/22 2233 01/19/22 0655  NA 139 137 142 143 141  K 3.4* 3.9 3.2* 3.0* 3.1*  CL 99 100 105 104 107  CO2 '30 27 28 26 24  '$ GLUCOSE 94 92 84 76 85  BUN 25* 24* 23 24* 22  CREATININE 2.44* 2.21* 1.97* 2.10* 2.05*  CALCIUM 8.7* 8.1* 8.7* 8.8* 8.6*  MG  --  2.2  --  2.0 1.9   Liver Function Tests: Recent Labs  Lab 01/15/22 0415  AST 15  ALT 17  ALKPHOS 88  BILITOT 0.6  PROT 6.4*  ALBUMIN 2.5*   No results for input(s): "LIPASE",  "AMYLASE" in the last 168 hours. Recent Labs  Lab 01/19/22 0655  AMMONIA <10   Coagulation Profile: No results for input(s): "INR", "PROTIME" in the last 168 hours. CBC: Recent Labs  Lab 01/14/22 2006 01/15/22 0415 01/18/22 0444 01/19/22 0655  WBC 8.8 8.9 5.5 6.1  NEUTROABS 7.2 7.1  --   --   HGB 9.7* 9.0* 9.5* 9.0*  HCT 31.4* 30.3* 31.9* 29.2*  MCV 93.5 95.3 95.8 94.8  PLT 406* 366 325 359   Cardiac Enzymes: No results for input(s): "CKTOTAL", "CKMB", "CKMBINDEX", "TROPONINI" in the last 168 hours. BNP: Invalid input(s): "POCBNP" CBG: No results for input(s): "GLUCAP" in the last 168 hours. HbA1C: No results for input(s): "HGBA1C" in the last 72 hours. Urine analysis:    Component Value Date/Time   COLORURINE YELLOW 01/03/2022 1820   APPEARANCEUR HAZY (A) 01/03/2022 1820   LABSPEC 1.012 01/03/2022 1820   PHURINE 5.0 01/03/2022 1820   GLUCOSEU NEGATIVE 01/03/2022 1820   HGBUR LARGE (A) 01/03/2022 1820   BILIRUBINUR NEGATIVE 01/03/2022 1820   KETONESUR NEGATIVE 01/03/2022 1820   PROTEINUR 30 (A) 01/03/2022 1820  UROBILINOGEN 0.2 09/25/2010 0530   NITRITE POSITIVE (A) 01/03/2022 1820   LEUKOCYTESUR LARGE (A) 01/03/2022 1820   Sepsis Labs: '@LABRCNTIP'$ (procalcitonin:4,lacticidven:4) ) Recent Results (from the past 240 hour(s))  Resp Panel by RT-PCR (Flu A&B, Covid) Anterior Nasal Swab     Status: None   Collection Time: 01/14/22  9:03 PM   Specimen: Anterior Nasal Swab  Result Value Ref Range Status   SARS Coronavirus 2 by RT PCR NEGATIVE NEGATIVE Final    Comment: (NOTE) SARS-CoV-2 target nucleic acids are NOT DETECTED.  The SARS-CoV-2 RNA is generally detectable in upper respiratory specimens during the acute phase of infection. The lowest concentration of SARS-CoV-2 viral copies this assay can detect is 138 copies/mL. A negative result does not preclude SARS-Cov-2 infection and should not be used as the sole basis for treatment or other patient management  decisions. A negative result may occur with  improper specimen collection/handling, submission of specimen other than nasopharyngeal swab, presence of viral mutation(s) within the areas targeted by this assay, and inadequate number of viral copies(<138 copies/mL). A negative result must be combined with clinical observations, patient history, and epidemiological information. The expected result is Negative.  Fact Sheet for Patients:  EntrepreneurPulse.com.au  Fact Sheet for Healthcare Providers:  IncredibleEmployment.be  This test is no t yet approved or cleared by the [place] FDA and  has been authorized for detection and/or diagnosis of SARS-CoV-2 by FDA under an Emergency Use Authorization (EUA). This EUA will remain  in effect (meaning this test can be used) for the duration of the COVID-19 declaration under Section 564(b)(1) of the Act, 21 U.S.C.section 360bbb-3(b)(1), unless the authorization is terminated  or revoked sooner.       Influenza A by PCR NEGATIVE NEGATIVE Final   Influenza B by PCR NEGATIVE NEGATIVE Final    Comment: (NOTE) The Xpert Xpress SARS-CoV-2/FLU/RSV plus assay is intended as an aid in the diagnosis of influenza from Nasopharyngeal swab specimens and should not be used as a sole basis for treatment. Nasal washings and aspirates are unacceptable for Xpert Xpress SARS-CoV-2/FLU/RSV testing.  Fact Sheet for Patients: EntrepreneurPulse.com.au  Fact Sheet for Healthcare Providers: IncredibleEmployment.be  This test is not yet approved or cleared by the [place] FDA and has been authorized for detection and/or diagnosis of SARS-CoV-2 by FDA under an Emergency Use Authorization (EUA). This EUA will remain in effect (meaning this test can be used) for the duration of the COVID-19 declaration under Section 564(b)(1) of the Act, 21 U.S.C. section 360bbb-3(b)(1), unless the  authorization is terminated or revoked.  Performed at [facility], [address] [place], [place] [address]   Culture, blood (routine x 2) Call MD if unable to obtain prior to antibiotics being given     Status: None   Collection Time: 01/14/22 10:13 PM   Specimen: BLOOD RIGHT HAND  Result Value Ref Range Status   Specimen Description BLOOD RIGHT HAND BOTTLES DRAWN AEROBIC ONLY  Final   Special Requests   Final    Blood Culture results may not be optimal due to an inadequate volume of blood received in culture bottles   Culture   Final    NO GROWTH 5 DAYS Performed at [facility], [address] [place], [place] [address]    Report Status 01/19/2022 FINAL  Final  Culture, blood (routine x 2) Call MD if unable to obtain prior to antibiotics being given     Status: None   Collection Time: 01/14/22 10:22 PM  Specimen: Left Antecubital; Blood  Result Value Ref Range Status   Specimen Description   Final    LEFT ANTECUBITAL BOTTLES DRAWN AEROBIC AND ANAEROBIC   Special Requests Blood Culture adequate volume  Final   Culture   Final    NO GROWTH 5 DAYS Performed at [facility], [address], [place], [place] [address]    Report Status 01/19/2022 FINAL  Final  MRSA Next Gen by PCR, Nasal     Status: Abnormal   Collection Time: 01/14/22 11:19 PM   Specimen: Nasal Mucosa; Nasal Swab  Result Value Ref Range Status   MRSA by PCR Next Gen DETECTED (A) NOT DETECTED Final    Comment: RESULT CALLED TO, READ BACK BY AND VERIFIED WITH: WHITE,D @ 0102 ON 01/15/22 BY JUW Performed at [facility], [address], [place], [place] [address]   Culture, Respiratory w Gram Stain     Status: None   Collection Time: 01/17/22 11:45 AM   Specimen: Tracheal Aspirate; Respiratory  Result Value Ref Range Status   Specimen Description   Final    TRACHEAL ASPIRATE Performed at [facility], [address], [place], [place] [address]    Special Requests   Final    NONE Performed at [facility], [address] [place], [place] [address]    Gram Stain   Final    WBC PRESENT, PREDOMINANTLY MONONUCLEAR NO ORGANISMS SEEN Performed at [facility], [place] [address], [place], [place] [address]    Culture FEW CANDIDA TROPICALIS  Final   Report Status 01/19/2022 FINAL  Final     Scheduled Meds:  budesonide  0.5 mg Nebulization BID   calcitRIOL  0.25 mcg Oral Daily   Chlorhexidine Gluconate Cloth  6 each Topical Daily   citalopram  20 mg Oral Daily   enoxaparin (LOVENOX) injection  1 mg/kg Subcutaneous M57Q   folic acid  1 mg Oral Daily   furosemide  40 mg Intravenous Daily   ipratropium-albuterol  3 mL Nebulization Q6H   levothyroxine  150 mcg Oral Daily   metoprolol tartrate  12.5 mg Oral BID   midodrine  5 mg Oral TID WC   mupirocin ointment  1 Application Nasal BID   potassium chloride  40 mEq Oral Once   cyanocobalamin  500 mcg Oral Daily   Continuous Infusions:  sodium chloride Stopped (01/16/22 0814)   levETIRAcetam 750 mg (01/19/22 1034)   vancomycin 1,500 mg (01/19/22 0812)    Procedures/Studies: DG CHEST PORT 1 VIEW  Result Date: 01/18/2022 CLINICAL DATA:  [id number].  Shortness of breath. EXAM: PORTABLE CHEST 1 VIEW COMPARISON:  Portable 01/16/2022 at 8:37 a.m. FINDINGS: 4:56 a.m. An oxygen mask superimposes over the left apical area. There is a tracheostomy cannula with the tip 5.4 cm from the carina. There is a low inspiration. There is patchy consolidation, atelectasis or combination in the lower lung fields overlying small pleural effusions. The upper lung fields remain clear. No new or worsened opacity is seen. There is mild cardiomegaly without CHF. Stable mediastinum with mild aortic atherosclerosis. Degenerative change thoracic spine and asymmetrically of the right shoulder. IMPRESSION: There previously was interstitial edema which is not seen today. Bibasilar opacities, atelectasis and/or pneumonia, and small underlying pleural effusions appear similar.  Overall aeration is unchanged. Electronically Signed   By: Telford Nab M.D.   On: 01/18/2022 06:02   DG Chest Port 1 View  Result Date: 01/16/2022 CLINICAL DATA:  Respiratory distress EXAM: PORTABLE CHEST 1 VIEW  COMPARISON:  Chest radiograph 01/15/2019 FINDINGS: Increased bilateral pleural effusions, left-greater-than-right. Right basilar linear and left basilar consolidative opacities may represent atelectasis, but superimposed infection is difficult to exclude. Increased interstitial opacities and peribronchial wall thickening are suggestive of mild pulmonary edema. There is a tracheostomy in place. Visualized upper abdomen is unremarkable. No acute osseous abnormality. IMPRESSION: New mild pulmonary edema with moderate left and small right pleural effusions. Bibasilar opacities may represent atelectasis in the setting of worsening pleural effusions, but a component of superimposed infection is difficult to exclude. Electronically Signed   By: Marin Roberts M.D.   On: 01/16/2022 09:01   DG Chest Port 1 View  Result Date: 01/14/2022 CLINICAL DATA:  sob EXAM: PORTABLE CHEST 1 VIEW.  Patient is rotated. COMPARISON:  Chest x-ray 01/03/2022, CT chest 01/04/2022 FINDINGS: Tracheostomy tube with tip terminating 8.5 cm above the carina. The heart and mediastinal contours are grossly unchanged. Low lung volumes persistent retrocardiac airspace opacity. No pulmonary edema. Similar-appearing blunting of bilateral costophrenic angles with no significant pleural effusion. No pneumothorax. No acute osseous abnormality. IMPRESSION: 1. Low lung volumes, persistent retrocardiac airspace opacity with limited evaluation due to patient rotation. 2. Similar-appearing blunting of bilateral costophrenic angles with no significant pleural effusion. Electronically Signed   By: Iven Finn M.D.   On: 01/14/2022 19:49   ECHOCARDIOGRAM COMPLETE  Result Date: 01/07/2022    ECHOCARDIOGRAM REPORT   Patient Name:   BREIONA COUVILLON Date of Exam: 01/07/2022 Medical Rec #:  [id number]       Height:       65.5 in Accession #:    [id number]      Weight:       255.0 lb Date of Birth:  [birth year]       BSA:          2.206 m Patient Age:    3 years        BP:           99/48 mmHg Patient Gender: F               HR:           72 bpm. Exam Location:  [facility] Procedure: 2D Echo, Color Doppler, Cardiac Doppler and Intracardiac            Opacification Agent Indications:    N02.72 Acute diastolic (congestive) heart failure  History:        Patient has prior history of Echocardiogram examinations, most                 recent 04/19/2021. CHF, Arrythmias:Atrial Fibrillation; Risk                 Factors:Hypertension, Diabetes, Dyslipidemia and Current Smoker.  Sonographer:    Raquel Sarna Senior RDCS Referring Phys: [phone] Zebastian Carico  Sonographer Comments: Technically difficult due to patient body habitus. Patient is morbidly obese. IMPRESSIONS  1. Left ventricular ejection fraction, by estimation, is 65 to 70%. The left ventricle has normal function. The left ventricle has no regional wall motion abnormalities. Left ventricular diastolic parameters were normal.  2. Right ventricular systolic function is normal. The right ventricular size is normal. Tricuspid regurgitation signal is inadequate for assessing PA pressure.  3. The mitral valve is normal in structure. No evidence of mitral valve regurgitation. No evidence of mitral stenosis.  4. The aortic valve was not well visualized. Aortic valve regurgitation is not visualized. No aortic stenosis is present.  5. The inferior vena  cava is dilated in size with <50% respiratory variability, suggesting right atrial pressure of 15 mmHg. FINDINGS  Left Ventricle: Left ventricular ejection fraction, by estimation, is 65 to 70%. The left ventricle has normal function. The left ventricle has no regional wall motion abnormalities. Definity contrast agent was given IV to delineate the left ventricular  endocardial  borders. The left ventricular internal cavity size was normal in size. There is no left ventricular hypertrophy. Left ventricular diastolic parameters were normal. Right Ventricle: The right ventricular size is normal. Right vetricular wall thickness was not well visualized. Right ventricular systolic function is normal. Tricuspid regurgitation signal is inadequate for assessing PA pressure. Left Atrium: Left atrial size was normal in size. Right Atrium: Right atrial size was normal in size. Pericardium: There is no evidence of pericardial effusion. Mitral Valve: The mitral valve is normal in structure. No evidence of mitral valve regurgitation. No evidence of mitral valve stenosis. Tricuspid Valve: The tricuspid valve is not well visualized. Tricuspid valve regurgitation is trivial. No evidence of tricuspid stenosis. Aortic Valve: The aortic valve was not well visualized. Aortic valve regurgitation is not visualized. No aortic stenosis is present. Aortic valve mean gradient measures 4.0 mmHg. Aortic valve peak gradient measures 9.3 mmHg. Aortic valve area, by VTI measures 2.33 cm. Pulmonic Valve: The pulmonic valve was not well visualized. Pulmonic valve regurgitation is not visualized. No evidence of pulmonic stenosis. Aorta: The aortic root is normal in size and structure. Venous: The inferior vena cava is dilated in size with less than 50% respiratory variability, suggesting right atrial pressure of 15 mmHg. IAS/Shunts: The interatrial septum was not well visualized.  LEFT VENTRICLE PLAX 2D LVIDd:         4.60 cm LVIDs:         2.50 cm LV PW:         0.80 cm LV IVS:        1.00 cm LVOT diam:     1.80 cm LV SV:         67 LV SV Index:   30 LVOT Area:     2.54 cm  RIGHT VENTRICLE RV S prime:     13.30 cm/s TAPSE (M-mode): 2.0 cm LEFT ATRIUM             Index        RIGHT ATRIUM           Index LA diam:        3.60 cm 1.63 cm/m   RA Area:     20.30 cm LA Vol (A2C):   45.4 ml 20.58 ml/m  RA Volume:   64.40 ml   29.20 ml/m LA Vol (A4C):   63.1 ml 28.61 ml/m LA Biplane Vol: 54.0 ml 24.48 ml/m  AORTIC VALVE AV Area (Vmax):    2.06 cm AV Area (Vmean):   2.15 cm AV Area (VTI):     2.33 cm AV Vmax:           152.23 cm/s AV Vmean:          92.193 cm/s AV VTI:            0.289 m AV Peak Grad:      9.3 mmHg AV Mean Grad:      4.0 mmHg LVOT Vmax:         123.00 cm/s LVOT Vmean:        78.000 cm/s LVOT VTI:          0.264 m LVOT/AV  VTI ratio: 0.91  AORTA Ao Root diam: 3.00 cm Ao Asc diam:  2.90 cm  SHUNTS Systemic VTI:  0.26 m Systemic Diam: 1.80 cm Carlyle Dolly MD Electronically signed by Carlyle Dolly MD Signature Date/Time: 01/07/2022/3:23:07 PM    Final    CT CHEST WO CONTRAST  Result Date: 01/04/2022 CLINICAL DATA:  A 61 year old female with chronic dyspnea presents for evaluation in the setting of abnormal chest radiograph. EXAM: CT CHEST WITHOUT CONTRAST TECHNIQUE: Multidetector CT imaging of the chest was performed following the standard protocol without IV contrast. RADIATION DOSE REDUCTION: This exam was performed according to the departmental dose-optimization program which includes automated exposure control, adjustment of the mA and/or kV according to patient size and/or use of iterative reconstruction technique. COMPARISON:  December of 2021 FINDINGS: Cardiovascular: Scattered aortic atherosclerosis. Three-vessel coronary artery disease. Normal heart size without pericardial effusion or nodularity. Aberrant RIGHT subclavian artery. Mild engorgement of central pulmonary vasculature. Limited assessment of cardiovascular structures given lack of intravenous contrast. Mediastinum/Nodes: Tracheostomy tube terminating in the proximal trachea. No signs of adenopathy in the chest. No acute process in the mediastinum. Esophagus is grossly normal. Lungs/Pleura: Patchy airspace disease in the chest in LEFT upper lobe, lingula, LEFT lower lobe and RIGHT lung base. Volume loss in the LEFT lung base associated also with  consolidative changes and material in bronchial structures. No pneumothorax. Trace pleural effusions. Airways are otherwise patent. Upper Abdomen: Incidental imaging of upper abdominal contents without acute process to the extent evaluated. Musculoskeletal: Spinal degenerative changes. No acute or destructive bone process. Rib fractures of varying ages, no acute fractures most are chronic or subacute and without displacement. Fracture at the T9 level with 20-30% loss of height. Vacuum phenomenon within the fracture site. No surrounding stranding. T11 with loss of height as well. These have occurred since December of 2021. No listhesis. IMPRESSION: 1. Multifocal pneumonia with acute on chronic sequela of aspiration favored at the LEFT lung base. 2. Age-indeterminate compression fractures potentially subacute/recent at the T9 level and T11 level are new since 2021. Correlate with any history of prior fracture to these areas and with any current symptoms. 3. Aortic atherosclerosis and coronary artery disease. Aortic Atherosclerosis (ICD10-I70.0). Electronically Signed   By: Zetta Bills M.D.   On: 01/04/2022 17:23   DG Chest Portable 1 View  Result Date: 01/03/2022 CLINICAL DATA:  Provided history: Shortness of breath. EXAM: PORTABLE CHEST 1 VIEW COMPARISON:  Prior chest radiographs 08/08/2021 and earlier. FINDINGS: A tracheostomy tube is noted. Cardiomegaly. Central pulmonary vascular congestion. Prominence of the interstitial lung markings bilaterally, compatible with pulmonary interstitial edema. Irregular and nodular opacities within the right lung base. The nodular opacities measure up to 16 mm. Irregular opacities within the left lung base. No evidence of pneumothorax. No acute bony abnormality identified. IMPRESSION: Cardiomegaly with pulmonary interstitial edema. Opacities within bilateral lung bases, which may reflect atelectasis and/or pneumonia. Superimposed more nodular opacities within the right lung  base. A chest CT is recommended to better assess for possible pulmonary nodules at this site. Small left pleural effusion, which may be partially loculated. Electronically Signed   By: Kellie Simmering D.O.   On: 01/03/2022 14:50   US Venous Img Lower Bilateral  Result Date: 01/03/2022 CLINICAL DATA:  Bilateral open wounds. EXAM: BILATERAL LOWER EXTREMITY VENOUS DOPPLER ULTRASOUND TECHNIQUE: Gray-scale sonography with graded compression, as well as color Doppler and duplex ultrasound were performed to evaluate the lower extremity deep venous systems from the level of the common femoral vein  and including the common femoral, femoral, profunda femoral, popliteal and calf veins including the posterior tibial, peroneal and gastrocnemius veins when visible. The superficial great saphenous vein was also interrogated. Spectral Doppler was utilized to evaluate flow at rest and with distal augmentation maneuvers in the common femoral, femoral and popliteal veins. COMPARISON:  None Available. FINDINGS: RIGHT LOWER EXTREMITY Common Femoral Vein: No evidence of thrombus. Normal compressibility, respiratory phasicity and response to augmentation. Saphenofemoral Junction: No evidence of thrombus. Normal compressibility and flow on color Doppler imaging. Profunda Femoral Vein: No evidence of thrombus. Normal compressibility and flow on color Doppler imaging. Femoral Vein: No evidence of thrombus. Normal compressibility, respiratory phasicity and response to augmentation. Popliteal Vein: No evidence of thrombus. Normal compressibility, respiratory phasicity and response to augmentation. Calf Veins: Limited evaluation. Superficial Great Saphenous Vein: Limited evaluation. Other Findings:  Large amount of subcutaneous edema. LEFT LOWER EXTREMITY Common Femoral Vein: No evidence of thrombus. Normal compressibility, respiratory phasicity and response to augmentation. Saphenofemoral Junction: No evidence of thrombus. Normal  compressibility and flow on color Doppler imaging. Profunda Femoral Vein: No evidence of thrombus. Normal compressibility and flow on color Doppler imaging. Femoral Vein: No evidence of thrombus. Normal compressibility, respiratory phasicity and response to augmentation. Popliteal Vein: No evidence of thrombus. Normal compressibility, respiratory phasicity and response to augmentation. Calf Veins: Limited evaluation. Superficial Great Saphenous Vein: No evidence of thrombus. Normal compressibility. Other Findings:  Subcutaneous edema. IMPRESSION: No evidence of deep venous thrombosis in either lower extremity. However, the study has technical limitations due to body habitus and edema. Limited evaluation the bilateral deep calf veins. Electronically Signed   By: Markus Daft M.D.   On: 01/03/2022 13:45    Orson Eva, DO  [facility]  If 7PM-7AM, please contact night-coverage [email or web address] Password [email or web address] 01/19/2022, 3:01 PM   LOS: 5 days

## 2022-01-19 NOTE — Progress Notes (Signed)
Palliative: [name], Veronica Horton, is lying quietly in bed.  She appears acutely/chronically ill, frail, morbidly obese.  She has significantly worsened over the last few days.  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  At this point, she is not oriented, she is clearly unable to make her basic needs known.  There is no family at bedside at this time.  Conference with bedside nursing staff related to patient condition, needs, goals of care, disposition.  Veronica Horton named her sister, Veronica Horton as her healthcare surrogate when she was alert and oriented Monday.  Call to sister, Veronica Horton.  Veronica Horton shares that she does not feel comfortable making healthcare choices for [name].  She states that she prefer Veronica Horton, Veronica Horton, the Media planner.   Call to Horton, Veronica Horton.  No answer, left voicemail message.  Conference with attending, pulmonologist, bedside nursing staff, transition of care team related to patient condition, needs, goals of care, disposition.  Plan: At this point continue to treat the treatable but no CPR or intubation.  Time for outcomes.  Per attending, Horton is to arrive on Saturday.  Will continue goals of care discussions at that time.  HCPOA: Although Veronica Horton had named her sister, Veronica Horton, as her healthcare surrogate, Veronica Horton decision-making states that she would prefer Horton, Veronica Horton, be [name].  85 minutes  Veronica Axe, NP Palliative medicine team Team phone [phone] Greater than 50% of this time was spent counseling and coordinating care related to the above assessment and plan.

## 2022-01-20 ENCOUNTER — Inpatient Hospital Stay (HOSPITAL_COMMUNITY): Payer: Medicaid Other

## 2022-01-20 ENCOUNTER — Other Ambulatory Visit: Payer: Self-pay

## 2022-01-20 DIAGNOSIS — N179 Acute kidney failure, unspecified: Secondary | ICD-10-CM | POA: Diagnosis not present

## 2022-01-20 DIAGNOSIS — I472 Ventricular tachycardia, unspecified: Secondary | ICD-10-CM | POA: Insufficient documentation

## 2022-01-20 DIAGNOSIS — G40909 Epilepsy, unspecified, not intractable, without status epilepticus: Secondary | ICD-10-CM | POA: Diagnosis not present

## 2022-01-20 DIAGNOSIS — G9341 Metabolic encephalopathy: Secondary | ICD-10-CM

## 2022-01-20 DIAGNOSIS — N1832 Chronic kidney disease, stage 3b: Secondary | ICD-10-CM | POA: Diagnosis not present

## 2022-01-20 DIAGNOSIS — J690 Pneumonitis due to inhalation of food and vomit: Principal | ICD-10-CM

## 2022-01-20 LAB — CREATININE, SERUM
Creatinine, Ser: 2.48 mg/dL — ABNORMAL HIGH (ref 0.44–1.00)
GFR, Estimated: 21 mL/min — ABNORMAL LOW (ref >60)

## 2022-01-20 LAB — GLUCOSE, CAPILLARY: Glucose-Capillary: 77 mg/dL (ref 70–99)

## 2022-01-20 LAB — URINE CULTURE: Culture: NO GROWTH

## 2022-01-20 MED ORDER — METOPROLOL TARTRATE 5 MG/5ML IV SOLN
5.0000 mg | Freq: Four times a day (QID) | INTRAVENOUS | Status: DC
  Administered 2022-01-20 – 2022-01-21 (×3): 5 mg via INTRAVENOUS
  Filled 2022-01-20 (×3): qty 5

## 2022-01-20 NOTE — Progress Notes (Signed)
PT Cancellation Note  Patient Details Name: Veronica Horton MRN: [id number] DOB: 1960/05/19   Cancelled Treatment:    Reason Eval/Treat Not Completed: Fatigue/lethargy limiting ability to participate. Therapy held due to patient not responding to commands and unable to participate.   9:25 AM, 01/20/22 Lonell Grandchild, MPT Physical Therapist with [facility] [phone] office [phone] mobile phone'

## 2022-01-20 NOTE — TOC Progression Note (Signed)
Transition of Care [facility]) - Progression Note    Patient Details  Name: Veronica Horton MRN: [id number] Date of Birth: 04/13/1961  [facility] CM/SW Contact  Shade Flood, LCSW Phone Number: 01/20/2022, 9:14 AM  Clinical Narrative:     TOC following. Per MD, pt's condition is not improving and he has spoken with pt's son who is next of kin decision maker. Son will be here tomorrow to see patient and have further [place] discussion with MD. Donella Stade will follow and assist after son and MD have spoken.  Expected Discharge Plan: [place] Barriers to Discharge: Continued Medical Work up  Expected Discharge Plan and Services Expected Discharge Plan: [place] In-house Referral: Clinical Social Work   Post Acute Care Choice: [place] Living arrangements for the past 2 months: [place] Determinants of Health (SDOH) Interventions    Readmission Risk Interventions    08/09/2021    2:37 PM 04/26/2020   10:28 AM  Readmission Risk Prevention Plan  Transportation Screening Complete Complete  Medication Review (RN Care Manager) Complete Complete  PCP or Specialist appointment within 3-5 days of discharge  Complete  HRI or [place] Complete Complete  SW Recovery Care/Counseling Consult Complete Complete  Palliative Care Screening Not Applicable Not [place] Not Applicable Patient Refused

## 2022-01-20 NOTE — Progress Notes (Signed)
PROGRESS NOTE  Veronica Horton:[id number] DOB: 13-Oct-1960 DOA: 01/14/2022 PCP: [facility]  Brief History:  61 year old female with a history of COPD, tobacco abuse, chronic respiratory failure with tracheostomy on 6-8 L at home, laryngeal cancer status post tracheostomy, seizure disorder, GERD, hypertension, orthostatic hypotension presenting with generalized weakness and increasing left lower extremity redness and pain.  The patient visited the emergency department at [facility] on 12/27/2026 with lower extremity edema and pain.  The patient was discharged home from the ED with clindamycin with which she had been compliant.  She continued to have left lower extremity pain and edema.  She stated that she had her left leg against the wall approximately 1 week prior to this admission.  She denies any other recent injuries or falls.  She uses a walker at baseline.  She continues to smoke 1/2 pack/day Although the patient did not formally leave [place], she aggressively advocated for discharge on 01/09/2022. On 01/10/2022, the patient slid out of bed and went to the ED at [facility].  She refused any blood work and was subsequently discharged home. She presented again on 01/14/2022 with shortness of breath with nausea and vomiting.  Chest x-ray showed increased interstitial markings with moderate left pleural effusion and small right pleural effusion.  The patient was started on Lasix.  She was also started on vancomycin and cefepime for pneumonia.  Since admission to the hospital, the patient's condition has waxed and waned without much improvement.  Her mentation also has been waxing and waning.  Palliative medicine was consulted.  Pulmonary medicine was consulted.  She was started antibiotics and lasix.  Although she remained afebrile and hemodynamically stable, her overall medical condition continued to deteriorate with decline in mental status and refusing po  intake.  [place] discussions were held with sister and boyfriend.  They abdicated further decision making to patient's son.  [name] was contacted and favors a more comfort focus approach.   Assessment/Plan: Acute on chronic respiratory failure with hypoxia and hypercarbia -Multifactorial including aspiration pneumonitis, COPD, fluid overload -The patient is noncompliant with her prescribed diet -Currently stable on 8 L -She is chronically on 8 L at home   Aspiration pneumonitis ([place]) CT chest on 01/04/2022 showed Multifocal pneumonia with acute on chronic sequela of aspiration 01/16/22 Speech therapy eval>>dys 3 with thin -MRSA+ -on vanc/cefepime--had 6 days -stopped cefepime due to altered mental status   Acute on chronic diastolic CHF (congestive heart failure) (HCC) Clinically fluid overloaded initially with pleural effusions and interstitial edema transition to IV lasix as pt intermittently refuses pills>>d/c 9/22 --9/10 echo--EF 65 to 70%, no WMA. Normal RV; trivial TR   Hypokalemia -replete -mag 1.9   Acute renal failure superimposed on stage 3b chronic kidney disease (HCC) Baseline creatinine 1.6-1.8 Presented with serum creatinine 2.44 Now fluid overloaded>>started IV lasix initially>>stop on 6/38   Acute metabolic Encephalopathy -remains confused and agitated -B12 1968 -folate 31.4 -TSH 16.36, Free T4 = 1.12 -ammonia <10 -VBG 7.48/37/76/27 -obtain UA--no pyuria -order CT brain--No significant interval change in the size of the right parietal mass with associated mass effect and edema -MR brain   Laryngeal cancer [facility]) - Patient has expressed not interested in further evaluation or treatment for the cancer -Palliative has seen her in the outpatient setting and planning to establish care more consistently -She is DNR.   Tracheostomy status (HCC) CHRONIC RESPIRATORY FAILURE WITH HYPOXIA - ON  6-8 L AT [place] in place; adequate saturation -stable   Tobacco  abuse Tobacco cessation discussed   Class 3 obesity (HCC) -Lifestyle modification -Body mass index is 41.79 kg/m.    Hypertension -Stable overall -started IV lopressor--pt refusing po meds   Goals of Care -discussed with son  -we discussed patient's overall declining medical condition despite optimal therapy.  We discussed that patient is worse than just a few weeks ago.  We discussed her overall poor prognosis in the setting of her poor baseline function, numerous significant comorbidities and overall FTT and declining health condition.  He agrees with DNR and open to pursue full comfort measure approach -son stated he will come to see patient on 9/23   Family Communication:  son updated 9/21   Consultants:  palliative, pulm   Code Status:  DNR   DVT Prophylaxis: apixaban     Procedures: As Listed in Progress Note Above   Antibiotics: None Cefepime 9/16>>9/22 Vanc 9/17>>9/22       Subjective: Patient is awake but confused.  ROS not possible due to encephalopathy.  No vomiting, diarrhea, resp distress.  Objective: Vitals:   01/20/22 1500 01/20/22 1600 01/20/22 1638 01/20/22 1700  BP: (!) 105/57 (!) 102/56  (!) 105/54  Pulse: 64 63 85 (!) 51  Resp: '14 12 19 12  '$ Temp:      TempSrc:      SpO2: 97% 95% 98% 98%  Weight:      Height:        Intake/Output Summary (Last 24 hours) at 01/20/2022 1717 Last data filed at 01/20/2022 1245 Gross per 24 hour  Intake 216 ml  Output 300 ml  Net -84 ml   Weight change:  Exam:  General:  Pt is alert, does not follow commands appropriately, not in acute distress HEENT: No icterus, No thrush, No neck mass, [place]/AT Cardiovascular: RRR, S1/S2, no rubs, no gallops Respiratory: bilateral scattered rales Abdomen: Soft/+BS, non tender, non distended, no guarding Extremities: No edema, No lymphangitis, No petechiae, No rashes, no synovitis   Data Reviewed: I have personally reviewed following labs and imaging studies Basic  Metabolic Panel: Recent Labs  Lab 01/14/22 2006 01/15/22 0415 01/18/22 0444 01/18/22 2233 01/19/22 0655 01/20/22 0318  NA 139 137 142 143 141  --   K 3.4* 3.9 3.2* 3.0* 3.1*  --   CL 99 100 105 104 107  --   CO2 '30 27 28 26 24  '$ --   GLUCOSE 94 92 84 76 85  --   BUN 25* 24* 23 24* 22  --   CREATININE 2.44* 2.21* 1.97* 2.10* 2.05* 2.48*  CALCIUM 8.7* 8.1* 8.7* 8.8* 8.6*  --   MG  --  2.2  --  2.0 1.9  --    Liver Function Tests: Recent Labs  Lab 01/15/22 0415  AST 15  ALT 17  ALKPHOS 88  BILITOT 0.6  PROT 6.4*  ALBUMIN 2.5*   No results for input(s): "LIPASE", "AMYLASE" in the last 168 hours. Recent Labs  Lab 01/19/22 0655  AMMONIA <10   Coagulation Profile: No results for input(s): "INR", "PROTIME" in the last 168 hours. CBC: Recent Labs  Lab 01/14/22 2006 01/15/22 0415 01/18/22 0444 01/19/22 0655  WBC 8.8 8.9 5.5 6.1  NEUTROABS 7.2 7.1  --   --   HGB 9.7* 9.0* 9.5* 9.0*  HCT 31.4* 30.3* 31.9* 29.2*  MCV 93.5 95.3 95.8 94.8  PLT 406* 366 325 359   Cardiac Enzymes: No  results for input(s): "CKTOTAL", "CKMB", "CKMBINDEX", "TROPONINI" in the last 168 hours. BNP: Invalid input(s): "POCBNP" CBG: Recent Labs  Lab 01/20/22 1557  GLUCAP 77   HbA1C: No results for input(s): "HGBA1C" in the last 72 hours. Urine analysis:    Component Value Date/Time   COLORURINE STRAW (A) 01/19/2022 1800   APPEARANCEUR HAZY (A) 01/19/2022 1800   LABSPEC 1.009 01/19/2022 1800   PHURINE 5.0 01/19/2022 1800   GLUCOSEU NEGATIVE 01/19/2022 1800   HGBUR MODERATE (A) 01/19/2022 1800   BILIRUBINUR NEGATIVE 01/19/2022 1800   KETONESUR 5 (A) 01/19/2022 1800   PROTEINUR NEGATIVE 01/19/2022 1800   UROBILINOGEN 0.2 09/25/2010 0530   NITRITE NEGATIVE 01/19/2022 1800   LEUKOCYTESUR NEGATIVE 01/19/2022 1800   Sepsis Labs: '@LABRCNTIP'$ (procalcitonin:4,lacticidven:4) ) Recent Results (from the past 240 hour(s))  Resp Panel by RT-PCR (Flu A&B, Covid) Anterior Nasal Swab     Status:  None   Collection Time: 01/14/22  9:03 PM   Specimen: Anterior Nasal Swab  Result Value Ref Range Status   SARS Coronavirus 2 by RT PCR NEGATIVE NEGATIVE Final    Comment: (NOTE) SARS-CoV-2 target nucleic acids are NOT DETECTED.  The SARS-CoV-2 RNA is generally detectable in upper respiratory specimens during the acute phase of infection. The lowest concentration of SARS-CoV-2 viral copies this assay can detect is 138 copies/mL. A negative result does not preclude SARS-Cov-2 infection and should not be used as the sole basis for treatment or other patient management decisions. A negative result may occur with  improper specimen collection/handling, submission of specimen other than nasopharyngeal swab, presence of viral mutation(s) within the areas targeted by this assay, and inadequate number of viral copies(<138 copies/mL). A negative result must be combined with clinical observations, patient history, and epidemiological information. The expected result is Negative.  Fact Sheet for Patients:  EntrepreneurPulse.com.au  Fact Sheet for Healthcare Providers:  IncredibleEmployment.be  This test is no t yet approved or cleared by the [place] FDA and  has been authorized for detection and/or diagnosis of SARS-CoV-2 by FDA under an Emergency Use Authorization (EUA). This EUA will remain  in effect (meaning this test can be used) for the duration of the COVID-19 declaration under Section 564(b)(1) of the Act, 21 U.S.C.section 360bbb-3(b)(1), unless the authorization is terminated  or revoked sooner.       Influenza A by PCR NEGATIVE NEGATIVE Final   Influenza B by PCR NEGATIVE NEGATIVE Final    Comment: (NOTE) The Xpert Xpress SARS-CoV-2/FLU/RSV plus assay is intended as an aid in the diagnosis of influenza from Nasopharyngeal swab specimens and should not be used as a sole basis for treatment. Nasal washings and aspirates are unacceptable  for Xpert Xpress SARS-CoV-2/FLU/RSV testing.  Fact Sheet for Patients: EntrepreneurPulse.com.au  Fact Sheet for Healthcare Providers: IncredibleEmployment.be  This test is not yet approved or cleared by the [place] FDA and has been authorized for detection and/or diagnosis of SARS-CoV-2 by FDA under an Emergency Use Authorization (EUA). This EUA will remain in effect (meaning this test can be used) for the duration of the COVID-19 declaration under Section 564(b)(1) of the Act, 21 U.S.C. section 360bbb-3(b)(1), unless the authorization is terminated or revoked.  Performed at [facility] [address] [place], [place] [address]   Culture, blood (routine x 2) Call MD if unable to obtain prior to antibiotics being given     Status: None   Collection Time: 01/14/22 10:13 PM   Specimen: BLOOD RIGHT HAND  Result Value Ref Range Status  Specimen Description BLOOD RIGHT HAND BOTTLES DRAWN AEROBIC ONLY  Final   Special Requests   Final    Blood Culture results may not be optimal due to an inadequate volume of blood received in culture bottles   Culture   Final    NO GROWTH 5 DAYS Performed at [facility], [address] [place], [place] [address]    Report Status 01/19/2022 FINAL  Final  Culture, blood (routine x 2) Call MD if unable to obtain prior to antibiotics being given     Status: None   Collection Time: 01/14/22 10:22 PM   Specimen: Left Antecubital; Blood  Result Value Ref Range Status   Specimen Description   Final    LEFT ANTECUBITAL BOTTLES DRAWN AEROBIC AND ANAEROBIC   Special Requests Blood Culture adequate volume  Final   Culture   Final    NO GROWTH 5 DAYS Performed at [facility], [address], [place], [place] [address]    Report Status 01/19/2022 FINAL  Final  MRSA Next Gen by PCR, Nasal     Status: Abnormal   Collection Time: 01/14/22 11:19 PM   Specimen: Nasal Mucosa; Nasal Swab  Result Value Ref Range Status    MRSA by PCR Next Gen DETECTED (A) NOT DETECTED Final    Comment: RESULT CALLED TO, READ BACK BY AND VERIFIED WITH: WHITE,D @ 0102 ON 01/15/22 BY JUW Performed at [facility], [address] [place], [place] [address]   Culture, Respiratory w Gram Stain     Status: None   Collection Time: 01/17/22 11:45 AM   Specimen: Tracheal Aspirate; Respiratory  Result Value Ref Range Status   Specimen Description   Final    TRACHEAL ASPIRATE Performed at [facility], [address], [place], [place] [address]    Special Requests   Final    NONE Performed at [facility], [address] [place],  [address]    Gram Stain   Final    WBC PRESENT, PREDOMINANTLY MONONUCLEAR NO ORGANISMS SEEN Performed at [facility], [place] [address], [place],  [address]    Culture FEW CANDIDA TROPICALIS  Final   Report Status 01/19/2022 FINAL  Final     Scheduled Meds:  budesonide  0.5 mg Nebulization BID   calcitRIOL  0.25 mcg Oral Daily   Chlorhexidine Gluconate Cloth  6 each Topical Daily   citalopram  20 mg Oral Daily   enoxaparin (LOVENOX) injection  1 mg/kg Subcutaneous Q33A   folic acid  1 mg Oral Daily   ipratropium-albuterol  3 mL Nebulization Q6H   levothyroxine  150 mcg Oral Daily   metoprolol tartrate  5 mg Intravenous Q6H   midodrine  5 mg Oral TID WC   potassium chloride  40 mEq Oral Once   cyanocobalamin  500 mcg Oral Daily   Continuous Infusions:  sodium chloride Stopped (01/16/22 0814)   levETIRAcetam Stopped (01/20/22 1024)   vancomycin Stopped (01/19/22 1018)    Procedures/Studies: CT HEAD WO CONTRAST (5MM)  Result Date: 01/19/2022 CLINICAL DATA:  Altered mental status. EXAM: CT HEAD WITHOUT CONTRAST TECHNIQUE: Contiguous axial images were obtained from the base of the skull through the vertex without intravenous contrast. RADIATION DOSE REDUCTION: This exam was performed according to the departmental dose-optimization program which includes automated exposure  control, adjustment of the mA and/or kV according to patient size and/or use of iterative reconstruction technique. COMPARISON:  Head CT dated 11/28/2020. FINDINGS: Evaluation of this exam is limited due to  motion artifact. 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 This is relatively unchanged compared to prior CT. There is associated mass effect and edema in the right parietal lobe also similar to prior CT. There is no acute intracranial hemorrhage. No midline shift. No extra-axial fluid collection. Vascular: No hyperdense vessel or unexpected calcification. Skull: Normal. Negative for fracture or focal lesion. Sinuses/Orbits: No acute finding. Other: None IMPRESSION: 1. No acute intracranial hemorrhage. 2. No significant interval change in the size of the right parietal mass with associated mass effect and edema. Electronically Signed   By: Anner Crete M.D.   On: 01/19/2022 18:31   DG CHEST PORT 1 VIEW  Result Date: 01/18/2022 CLINICAL DATA:  [id number].  Shortness of breath. EXAM: PORTABLE CHEST 1 VIEW COMPARISON:  Portable 01/16/2022 at 8:37 a.m. FINDINGS: 4:56 a.m. An oxygen mask superimposes over the left apical area. There is a tracheostomy cannula with the tip 5.4 cm from the carina. There is a low inspiration. There is patchy consolidation, atelectasis or combination in the lower lung fields overlying small pleural effusions. The upper lung fields remain clear. No new or worsened opacity is seen. There is mild cardiomegaly without CHF. Stable mediastinum with mild aortic atherosclerosis. Degenerative change thoracic spine and asymmetrically of the right shoulder. IMPRESSION: There previously was interstitial edema which is not seen today. Bibasilar opacities, atelectasis and/or pneumonia, and small underlying pleural effusions appear similar. Overall aeration is unchanged.  Electronically Signed   By: Telford Nab M.D.   On: 01/18/2022 06:02   DG Chest Port 1 View  Result Date: 01/16/2022 CLINICAL DATA:  Respiratory distress EXAM: PORTABLE CHEST 1 VIEW COMPARISON:  Chest radiograph 01/15/2019 FINDINGS: Increased bilateral pleural effusions, left-greater-than-right. Right basilar linear and left basilar consolidative opacities may represent atelectasis, but superimposed infection is difficult to exclude. Increased interstitial opacities and peribronchial wall thickening are suggestive of mild pulmonary edema. There is a tracheostomy in place. Visualized upper abdomen is unremarkable. No acute osseous abnormality. IMPRESSION: New mild pulmonary edema with moderate left and small right pleural effusions. Bibasilar opacities may represent atelectasis in the setting of worsening pleural effusions, but a component of superimposed infection is difficult to exclude. Electronically Signed   By: Marin Roberts M.D.   On: 01/16/2022 09:01   DG Chest Port 1 View  Result Date: 01/14/2022 CLINICAL DATA:  sob EXAM: PORTABLE CHEST 1 VIEW.  Patient is rotated. COMPARISON:  Chest x-ray 01/03/2022, CT chest 01/04/2022 FINDINGS: Tracheostomy tube with tip terminating 8.5 cm above the carina. The heart and mediastinal contours are grossly unchanged. Low lung volumes persistent retrocardiac airspace opacity. No pulmonary edema. Similar-appearing blunting of bilateral costophrenic angles with no significant pleural effusion. No pneumothorax. No acute osseous abnormality. IMPRESSION: 1. Low lung volumes, persistent retrocardiac airspace opacity with limited evaluation due to patient rotation. 2. Similar-appearing blunting of bilateral costophrenic angles with no significant pleural effusion. Electronically Signed   By: Iven Finn M.D.   On: 01/14/2022 19:49   ECHOCARDIOGRAM COMPLETE  Result Date: 01/07/2022    ECHOCARDIOGRAM REPORT   Patient Name:   VENETIA PREWITT Date of Exam: 01/07/2022  Medical Rec #:  [id number]       Height:       65.5 in Accession #:    [id number]      Weight:       255.0 lb Date of Birth:  March 20, 1961  BSA:          2.206 m Patient Age:    37 years        BP:           99/48 mmHg Patient Gender: F               HR:           72 bpm. Exam Location:  [facility] Procedure: 2D Echo, Color Doppler, Cardiac Doppler and Intracardiac            Opacification Agent Indications:    H06.23 Acute diastolic (congestive) heart failure  History:        Patient has prior history of Echocardiogram examinations, most                 recent 04/19/2021. CHF, Arrythmias:Atrial Fibrillation; Risk                 Factors:Hypertension, Diabetes, Dyslipidemia and Current Smoker.  Sonographer:    Raquel Sarna Senior RDCS Referring Phys: [phone] Aldous Housel  Sonographer Comments: Technically difficult due to patient body habitus. Patient is morbidly obese. IMPRESSIONS  1. Left ventricular ejection fraction, by estimation, is 65 to 70%. The left ventricle has normal function. The left ventricle has no regional wall motion abnormalities. Left ventricular diastolic parameters were normal.  2. Right ventricular systolic function is normal. The right ventricular size is normal. Tricuspid regurgitation signal is inadequate for assessing PA pressure.  3. The mitral valve is normal in structure. No evidence of mitral valve regurgitation. No evidence of mitral stenosis.  4. The aortic valve was not well visualized. Aortic valve regurgitation is not visualized. No aortic stenosis is present.  5. The inferior vena cava is dilated in size with <50% respiratory variability, suggesting right atrial pressure of 15 mmHg. FINDINGS  Left Ventricle: Left ventricular ejection fraction, by estimation, is 65 to 70%. The left ventricle has normal function. The left ventricle has no regional wall motion abnormalities. Definity contrast agent was given IV to delineate the left ventricular  endocardial borders. The left ventricular  internal cavity size was normal in size. There is no left ventricular hypertrophy. Left ventricular diastolic parameters were normal. Right Ventricle: The right ventricular size is normal. Right vetricular wall thickness was not well visualized. Right ventricular systolic function is normal. Tricuspid regurgitation signal is inadequate for assessing PA pressure. Left Atrium: Left atrial size was normal in size. Right Atrium: Right atrial size was normal in size. Pericardium: There is no evidence of pericardial effusion. Mitral Valve: The mitral valve is normal in structure. No evidence of mitral valve regurgitation. No evidence of mitral valve stenosis. Tricuspid Valve: The tricuspid valve is not well visualized. Tricuspid valve regurgitation is trivial. No evidence of tricuspid stenosis. Aortic Valve: The aortic valve was not well visualized. Aortic valve regurgitation is not visualized. No aortic stenosis is present. Aortic valve mean gradient measures 4.0 mmHg. Aortic valve peak gradient measures 9.3 mmHg. Aortic valve area, by VTI measures 2.33 cm. Pulmonic Valve: The pulmonic valve was not well visualized. Pulmonic valve regurgitation is not visualized. No evidence of pulmonic stenosis. Aorta: The aortic root is normal in size and structure. Venous: The inferior vena cava is dilated in size with less than 50% respiratory variability, suggesting right atrial pressure of 15 mmHg. IAS/Shunts: The interatrial septum was not well visualized.  LEFT VENTRICLE PLAX 2D LVIDd:         4.60 cm LVIDs:  2.50 cm LV PW:         0.80 cm LV IVS:        1.00 cm LVOT diam:     1.80 cm LV SV:         67 LV SV Index:   30 LVOT Area:     2.54 cm  RIGHT VENTRICLE RV S prime:     13.30 cm/s TAPSE (M-mode): 2.0 cm LEFT ATRIUM             Index        RIGHT ATRIUM           Index LA diam:        3.60 cm 1.63 cm/m   RA Area:     20.30 cm LA Vol (A2C):   45.4 ml 20.58 ml/m  RA Volume:   64.40 ml  29.20 ml/m LA Vol (A4C):    63.1 ml 28.61 ml/m LA Biplane Vol: 54.0 ml 24.48 ml/m  AORTIC VALVE AV Area (Vmax):    2.06 cm AV Area (Vmean):   2.15 cm AV Area (VTI):     2.33 cm AV Vmax:           152.23 cm/s AV Vmean:          92.193 cm/s AV VTI:            0.289 m AV Peak Grad:      9.3 mmHg AV Mean Grad:      4.0 mmHg LVOT Vmax:         123.00 cm/s LVOT Vmean:        78.000 cm/s LVOT VTI:          0.264 m LVOT/AV VTI ratio: 0.91  AORTA Ao Root diam: 3.00 cm Ao Asc diam:  2.90 cm  SHUNTS Systemic VTI:  0.26 m Systemic Diam: 1.80 cm Carlyle Dolly MD Electronically signed by Carlyle Dolly MD Signature Date/Time: 01/07/2022/3:23:07 PM    Final    CT CHEST WO CONTRAST  Result Date: 01/04/2022 CLINICAL DATA:  A 61 year old female with chronic dyspnea presents for evaluation in the setting of abnormal chest radiograph. EXAM: CT CHEST WITHOUT CONTRAST TECHNIQUE: Multidetector CT imaging of the chest was performed following the standard protocol without IV contrast. RADIATION DOSE REDUCTION: This exam was performed according to the departmental dose-optimization program which includes automated exposure control, adjustment of the mA and/or kV according to patient size and/or use of iterative reconstruction technique. COMPARISON:  December of 2021 FINDINGS: Cardiovascular: Scattered aortic atherosclerosis. Three-vessel coronary artery disease. Normal heart size without pericardial effusion or nodularity. Aberrant RIGHT subclavian artery. Mild engorgement of central pulmonary vasculature. Limited assessment of cardiovascular structures given lack of intravenous contrast. Mediastinum/Nodes: Tracheostomy tube terminating in the proximal trachea. No signs of adenopathy in the chest. No acute process in the mediastinum. Esophagus is grossly normal. Lungs/Pleura: Patchy airspace disease in the chest in LEFT upper lobe, lingula, LEFT lower lobe and RIGHT lung base. Volume loss in the LEFT lung base associated also with consolidative changes and  material in bronchial structures. No pneumothorax. Trace pleural effusions. Airways are otherwise patent. Upper Abdomen: Incidental imaging of upper abdominal contents without acute process to the extent evaluated. Musculoskeletal: Spinal degenerative changes. No acute or destructive bone process. Rib fractures of varying ages, no acute fractures most are chronic or subacute and without displacement. Fracture at the T9 level with 20-30% loss of height. Vacuum phenomenon within the fracture site. No surrounding stranding. T11 with loss of  height as well. These have occurred since December of 2021. No listhesis. IMPRESSION: 1. Multifocal pneumonia with acute on chronic sequela of aspiration favored at the LEFT lung base. 2. Age-indeterminate compression fractures potentially subacute/recent at the T9 level and T11 level are new since 2021. Correlate with any history of prior fracture to these areas and with any current symptoms. 3. Aortic atherosclerosis and coronary artery disease. Aortic Atherosclerosis (ICD10-I70.0). Electronically Signed   By: Zetta Bills M.D.   On: 01/04/2022 17:23   DG Chest Portable 1 View  Result Date: 01/03/2022 CLINICAL DATA:  Provided history: Shortness of breath. EXAM: PORTABLE CHEST 1 VIEW COMPARISON:  Prior chest radiographs 08/08/2021 and earlier. FINDINGS: A tracheostomy tube is noted. Cardiomegaly. Central pulmonary vascular congestion. Prominence of the interstitial lung markings bilaterally, compatible with pulmonary interstitial edema. Irregular and nodular opacities within the right lung base. The nodular opacities measure up to 16 mm. Irregular opacities within the left lung base. No evidence of pneumothorax. No acute bony abnormality identified. IMPRESSION: Cardiomegaly with pulmonary interstitial edema. Opacities within bilateral lung bases, which may reflect atelectasis and/or pneumonia. Superimposed more nodular opacities within the right lung base. A chest CT is  recommended to better assess for possible pulmonary nodules at this site. Small left pleural effusion, which may be partially loculated. Electronically Signed   By: Kellie Simmering D.O.   On: 01/03/2022 14:50   US Venous Img Lower Bilateral  Result Date: 01/03/2022 CLINICAL DATA:  Bilateral open wounds. EXAM: BILATERAL LOWER EXTREMITY VENOUS DOPPLER ULTRASOUND TECHNIQUE: Gray-scale sonography with graded compression, as well as color Doppler and duplex ultrasound were performed to evaluate the lower extremity deep venous systems from the level of the common femoral vein and including the common femoral, femoral, profunda femoral, popliteal and calf veins including the posterior tibial, peroneal and gastrocnemius veins when visible. The superficial great saphenous vein was also interrogated. Spectral Doppler was utilized to evaluate flow at rest and with distal augmentation maneuvers in the common femoral, femoral and popliteal veins. COMPARISON:  None Available. FINDINGS: RIGHT LOWER EXTREMITY Common Femoral Vein: No evidence of thrombus. Normal compressibility, respiratory phasicity and response to augmentation. Saphenofemoral Junction: No evidence of thrombus. Normal compressibility and flow on color Doppler imaging. Profunda Femoral Vein: No evidence of thrombus. Normal compressibility and flow on color Doppler imaging. Femoral Vein: No evidence of thrombus. Normal compressibility, respiratory phasicity and response to augmentation. Popliteal Vein: No evidence of thrombus. Normal compressibility, respiratory phasicity and response to augmentation. Calf Veins: Limited evaluation. Superficial Great Saphenous Vein: Limited evaluation. Other Findings:  Large amount of subcutaneous edema. LEFT LOWER EXTREMITY Common Femoral Vein: No evidence of thrombus. Normal compressibility, respiratory phasicity and response to augmentation. Saphenofemoral Junction: No evidence of thrombus. Normal compressibility and flow on color  Doppler imaging. Profunda Femoral Vein: No evidence of thrombus. Normal compressibility and flow on color Doppler imaging. Femoral Vein: No evidence of thrombus. Normal compressibility, respiratory phasicity and response to augmentation. Popliteal Vein: No evidence of thrombus. Normal compressibility, respiratory phasicity and response to augmentation. Calf Veins: Limited evaluation. Superficial Great Saphenous Vein: No evidence of thrombus. Normal compressibility. Other Findings:  Subcutaneous edema. IMPRESSION: No evidence of deep venous thrombosis in either lower extremity. However, the study has technical limitations due to body habitus and edema. Limited evaluation the bilateral deep calf veins. Electronically Signed   By: Markus Daft M.D.   On: 01/03/2022 13:45    Orson Eva, DO  [facility]  If 7PM-7AM, please contact night-coverage [email or web address] Password  [email or web address] 01/20/2022, 5:17 PM   LOS: 6 days

## 2022-01-21 DIAGNOSIS — R1310 Dysphagia, unspecified: Secondary | ICD-10-CM | POA: Diagnosis not present

## 2022-01-21 DIAGNOSIS — J690 Pneumonitis due to inhalation of food and vomit: Secondary | ICD-10-CM | POA: Diagnosis not present

## 2022-01-21 DIAGNOSIS — G9341 Metabolic encephalopathy: Secondary | ICD-10-CM | POA: Diagnosis not present

## 2022-01-21 DIAGNOSIS — N179 Acute kidney failure, unspecified: Secondary | ICD-10-CM | POA: Diagnosis not present

## 2022-01-21 LAB — COMPREHENSIVE METABOLIC PANEL
ALT: 12 U/L (ref 0–44)
AST: 16 U/L (ref 15–41)
Albumin: 2.5 g/dL — ABNORMAL LOW (ref 3.5–5.0)
Alkaline Phosphatase: 104 U/L (ref 38–126)
Anion gap: 12 (ref 5–15)
BUN: 28 mg/dL — ABNORMAL HIGH (ref 8–23)
CO2: 26 mmol/L (ref 22–32)
Calcium: 8.9 mg/dL (ref 8.9–10.3)
Chloride: 108 mmol/L (ref 98–111)
Creatinine, Ser: 3.07 mg/dL — ABNORMAL HIGH (ref 0.44–1.00)
GFR, Estimated: 17 mL/min — ABNORMAL LOW (ref >60)
Glucose, Bld: 75 mg/dL (ref 70–99)
Potassium: 3.2 mmol/L — ABNORMAL LOW (ref 3.5–5.1)
Sodium: 146 mmol/L — ABNORMAL HIGH (ref 135–145)
Total Bilirubin: 1 mg/dL (ref 0.3–1.2)
Total Protein: 6.5 g/dL (ref 6.5–8.1)

## 2022-01-21 LAB — CBC
HCT: 32.6 % — ABNORMAL LOW (ref 36.0–46.0)
Hemoglobin: 9.8 g/dL — ABNORMAL LOW (ref 12.0–15.0)
MCH: 28.9 pg (ref 26.0–34.0)
MCHC: 30.1 g/dL (ref 30.0–36.0)
MCV: 96.2 fL (ref 80.0–100.0)
Platelets: 336 10*3/uL (ref 150–400)
RBC: 3.39 MIL/uL — ABNORMAL LOW (ref 3.87–5.11)
RDW: 20.1 % — ABNORMAL HIGH (ref 11.5–15.5)
WBC: 5.2 10*3/uL (ref 4.0–10.5)
nRBC: 0 % (ref 0.0–0.2)

## 2022-01-21 LAB — MAGNESIUM: Magnesium: 2 mg/dL (ref 1.7–2.4)

## 2022-01-21 LAB — GLUCOSE, CAPILLARY: Glucose-Capillary: 106 mg/dL — ABNORMAL HIGH (ref 70–99)

## 2022-01-21 MED ORDER — IPRATROPIUM-ALBUTEROL 0.5-2.5 (3) MG/3ML IN SOLN
3.0000 mL | Freq: Three times a day (TID) | RESPIRATORY_TRACT | Status: DC
  Administered 2022-01-22 – 2022-01-30 (×25): 3 mL via RESPIRATORY_TRACT
  Filled 2022-01-21 (×25): qty 3

## 2022-01-21 MED ORDER — HYDROMORPHONE HCL 1 MG/ML IJ SOLN
0.5000 mg | INTRAMUSCULAR | Status: DC | PRN
  Administered 2022-01-22 – 2022-02-01 (×29): 0.5 mg via INTRAVENOUS
  Filled 2022-01-21 (×30): qty 0.5

## 2022-01-21 MED ORDER — OXYCODONE HCL 5 MG PO TABS
5.0000 mg | ORAL_TABLET | Freq: Four times a day (QID) | ORAL | Status: DC | PRN
  Administered 2022-01-21: 5 mg via ORAL
  Filled 2022-01-21 (×2): qty 1

## 2022-01-21 MED ORDER — ENOXAPARIN SODIUM 120 MG/0.8ML IJ SOSY: 1.0000 mg/kg | PREFILLED_SYRINGE | INTRAMUSCULAR | Status: DC

## 2022-01-21 MED ORDER — LEVETIRACETAM IN NACL 500 MG/100ML IV SOLN
500.0000 mg | Freq: Two times a day (BID) | INTRAVENOUS | Status: DC
  Administered 2022-01-21 – 2022-02-01 (×23): 500 mg via INTRAVENOUS
  Filled 2022-01-21 (×23): qty 100

## 2022-01-21 MED ORDER — LORAZEPAM 2 MG/ML IJ SOLN
1.0000 mg | INTRAMUSCULAR | Status: DC | PRN
  Administered 2022-01-26 – 2022-02-01 (×17): 1 mg via INTRAVENOUS
  Filled 2022-01-21 (×18): qty 1

## 2022-01-21 MED ORDER — LEVOTHYROXINE SODIUM 100 MCG/5ML IV SOLN
75.0000 ug | Freq: Every day | INTRAVENOUS | Status: DC
  Administered 2022-01-22 – 2022-02-01 (×11): 75 ug via INTRAVENOUS
  Filled 2022-01-21 (×11): qty 5

## 2022-01-21 MED ORDER — GLYCOPYRROLATE 0.2 MG/ML IJ SOLN
0.1000 mg | INTRAMUSCULAR | Status: DC | PRN
  Administered 2022-01-22 – 2022-01-29 (×6): 0.1 mg via INTRAVENOUS
  Filled 2022-01-21 (×6): qty 1

## 2022-01-21 MED ORDER — METOPROLOL TARTRATE 5 MG/5ML IV SOLN
2.5000 mg | Freq: Four times a day (QID) | INTRAVENOUS | Status: DC
  Filled 2022-01-21: qty 5

## 2022-01-21 NOTE — Progress Notes (Signed)
PROGRESS NOTE  Veronica Horton IEP:[id number] DOB: [birth year] DOA: 01/14/2022 PCP: [facility]  Brief History:  61 year old female with a history of COPD, tobacco abuse, chronic respiratory failure with tracheostomy on 6-8 L at home, laryngeal cancer status post tracheostomy, seizure disorder, GERD, hypertension, orthostatic hypotension presenting with generalized weakness and increasing left lower extremity redness and pain.  The patient visited the emergency department at [facility] on 12/27/2026 with lower extremity edema and pain.  The patient was discharged home from the ED with clindamycin with which she had been compliant.  She continued to have left lower extremity pain and edema.  She stated that she had her left leg against the wall approximately 1 week prior to this admission.  She denies any other recent injuries or falls.  She uses a walker at baseline.  She continues to smoke 1/2 pack/day Although the patient did not formally leave [facility], she aggressively advocated for discharge on 01/09/2022. On 01/10/2022, the patient slid out of bed and went to the ED at [facility].  She refused any blood work and was subsequently discharged home. She presented again on 01/14/2022 with shortness of breath with nausea and vomiting.  Chest x-ray showed increased interstitial markings with moderate left pleural effusion and small right pleural effusion.  The patient was started on Lasix.  She was also started on vancomycin and cefepime for pneumonia.  Since admission to the hospital, the patient's condition has waxed and waned without much improvement.  Her mentation also has been waxing and waning.  Palliative medicine was consulted.  Pulmonary medicine was consulted.  She was started antibiotics and lasix.  Although she remained afebrile and hemodynamically stable, her overall medical condition continued to deteriorate with decline in mental status and refusing po  intake.  [place] discussions were held with sister and boyfriend.  They abdicated further decision making to patient's son.  [name] was contacted and favors a more comfort focus approach.   Assessment/Plan: Acute on chronic respiratory failure with hypoxia and hypercarbia -Multifactorial including aspiration pneumonitis, COPD, fluid overload -The patient is noncompliant with her prescribed diet -Currently stable on 8 L -She is chronically on 8 L at home   Aspiration pneumonitis ([name]) CT chest on 01/04/2022 showed Multifocal pneumonia with acute on chronic sequela of aspiration 01/16/22 Speech therapy eval>>dys 3 with thin -MRSA+ -on vanc/cefepime--had 6 days -stopped cefepime due to altered mental status   Acute on chronic diastolic CHF (congestive heart failure) (HCC) Clinically fluid overloaded initially with pleural effusions and interstitial edema transition to IV lasix as pt intermittently refuses pills>>d/c 9/22 up to uptrending creatinine and euvolemia -01/08/22 echo--EF 65 to 70%, no WMA. Normal RV; trivial TR   Hypokalemia -replete -mag 1.9   Acute renal failure superimposed on stage 3b chronic kidney disease (HCC) Baseline creatinine 1.6-1.8 Presented with serum creatinine 2.44 Now fluid overloaded>>started IV lasix initially>>stop on 6/60   Acute metabolic Encephalopathy -remains confused and agitated -B12 1968 -folate 31.4 -TSH 16.36, Free T4 = 1.12 -ammonia <10 -VBG 7.48/37/76/27 -obtain UA--no pyuria -order CT brain--No significant interval change in the size of the right parietal mass with associated mass effect and edema -MR brain--3.7 cm right parietal convexity meningioma with associated vasogenic edema, similar to prior; no other acute findings   Laryngeal cancer [facility]) - Patient has expressed not interested in further evaluation or treatment for the cancer -Palliative has seen her in the outpatient setting  and planning to establish care more consistently -She  is DNR.   Tracheostomy status ([place]) CHRONIC RESPIRATORY FAILURE WITH HYPOXIA - ON 6-8 L AT [place] in place; adequate saturation -stable   Tobacco abuse Tobacco cessation discussed   Class 3 obesity (HCC) -Lifestyle modification -Body mass index is 41.79 kg/m.    Hypertension -Stable overall -started IV lopressor--pt refusing po meds   Goals of Care -discussed with son  -we discussed patient's overall declining medical condition despite optimal therapy.  We discussed that patient is worse than just a few weeks ago.  We discussed her overall poor prognosis in the setting of her poor baseline function, numerous significant comorbidities and overall FTT and declining health condition.  He agrees with DNR and open to pursue full comfort measure approach -son stated he will come to see patient on 9/23   Family Communication:  son updated 9/21   Consultants:  palliative, pulm   Code Status:  DNR   DVT Prophylaxis: apixaban     Procedures: As Listed in Progress Note Above   Antibiotics: None Cefepime 9/16>>9/22 Vanc 9/17>>9/22       Subjective: Pt denies cp, sob, abd pain  Objective: Vitals:   01/21/22 0730 01/21/22 0823 01/21/22 0833 01/21/22 1151  BP: (!) 144/84     Pulse: 78     Resp: 18     Temp: 98.3 F (36.8 C)   98.4 F (36.9 C)  TempSrc: Oral   Oral  SpO2: 97% 98% 100%   Weight:      Height:        Intake/Output Summary (Last 24 hours) at 01/21/2022 1259 Last data filed at 01/20/2022 1735 Gross per 24 hour  Intake --  Output 400 ml  Net -400 ml   Weight change:  Exam:  General:  Pt is alert, follows commands appropriately, not in acute distress HEENT: No icterus, No thrush, No neck mass, [place]/AT Cardiovascular: RRR, S1/S2, no rubs, no gallops Respiratory: bilateral scattered rhonchi Abdomen: Soft/+BS, non tender, non distended, no guarding Extremities: No edema, No lymphangitis, No petechiae, No rashes, no synovitis   Data  Reviewed: I have personally reviewed following labs and imaging studies Basic Metabolic Panel: Recent Labs  Lab 01/15/22 0415 01/18/22 0444 01/18/22 2233 01/19/22 0655 01/20/22 0318 01/21/22 0426  NA 137 142 143 141  --  146*  K 3.9 3.2* 3.0* 3.1*  --  3.2*  CL 100 105 104 107  --  108  CO2 '27 28 26 24  '$ --  26  GLUCOSE 92 84 76 85  --  75  BUN 24* 23 24* 22  --  28*  CREATININE 2.21* 1.97* 2.10* 2.05* 2.48* 3.07*  CALCIUM 8.1* 8.7* 8.8* 8.6*  --  8.9  MG 2.2  --  2.0 1.9  --  2.0   Liver Function Tests: Recent Labs  Lab 01/15/22 0415 01/21/22 0426  AST 15 16  ALT 17 12  ALKPHOS 88 104  BILITOT 0.6 1.0  PROT 6.4* 6.5  ALBUMIN 2.5* 2.5*   No results for input(s): "LIPASE", "AMYLASE" in the last 168 hours. Recent Labs  Lab 01/19/22 0655  AMMONIA <10   Coagulation Profile: No results for input(s): "INR", "PROTIME" in the last 168 hours. CBC: Recent Labs  Lab 01/14/22 2006 01/15/22 0415 01/18/22 0444 01/19/22 0655 01/21/22 0426  WBC 8.8 8.9 5.5 6.1 5.2  NEUTROABS 7.2 7.1  --   --   --   HGB 9.7* 9.0* 9.5* 9.0* 9.8*  HCT 31.4* 30.3* 31.9* 29.2* 32.6*  MCV 93.5 95.3 95.8 94.8 96.2  PLT 406* 366 325 359 336   Cardiac Enzymes: No results for input(s): "CKTOTAL", "CKMB", "CKMBINDEX", "TROPONINI" in the last 168 hours. BNP: Invalid input(s): "POCBNP" CBG: Recent Labs  Lab 01/20/22 1557  GLUCAP 77   HbA1C: No results for input(s): "HGBA1C" in the last 72 hours. Urine analysis:    Component Value Date/Time   COLORURINE STRAW (A) 01/19/2022 1800   APPEARANCEUR HAZY (A) 01/19/2022 1800   LABSPEC 1.009 01/19/2022 1800   PHURINE 5.0 01/19/2022 1800   GLUCOSEU NEGATIVE 01/19/2022 1800   HGBUR MODERATE (A) 01/19/2022 1800   BILIRUBINUR NEGATIVE 01/19/2022 1800   KETONESUR 5 (A) 01/19/2022 1800   PROTEINUR NEGATIVE 01/19/2022 1800   UROBILINOGEN 0.2 09/25/2010 0530   NITRITE NEGATIVE 01/19/2022 1800   LEUKOCYTESUR NEGATIVE 01/19/2022 1800   Sepsis  Labs: '@LABRCNTIP'$ (procalcitonin:4,lacticidven:4) ) Recent Results (from the past 240 hour(s))  Resp Panel by RT-PCR (Flu A&B, Covid) Anterior Nasal Swab     Status: None   Collection Time: 01/14/22  9:03 PM   Specimen: Anterior Nasal Swab  Result Value Ref Range Status   SARS Coronavirus 2 by RT PCR NEGATIVE NEGATIVE Final    Comment: (NOTE) SARS-CoV-2 target nucleic acids are NOT DETECTED.  The SARS-CoV-2 RNA is generally detectable in upper respiratory specimens during the acute phase of infection. The lowest concentration of SARS-CoV-2 viral copies this assay can detect is 138 copies/mL. A negative result does not preclude SARS-Cov-2 infection and should not be used as the sole basis for treatment or other patient management decisions. A negative result may occur with  improper specimen collection/handling, submission of specimen other than nasopharyngeal swab, presence of viral mutation(s) within the areas targeted by this assay, and inadequate number of viral copies(<138 copies/mL). A negative result must be combined with clinical observations, patient history, and epidemiological information. The expected result is Negative.  Fact Sheet for Patients:  EntrepreneurPulse.com.au  Fact Sheet for Healthcare Providers:  IncredibleEmployment.be  This test is no t yet approved or cleared by the [place] FDA and  has been authorized for detection and/or diagnosis of SARS-CoV-2 by FDA under an Emergency Use Authorization (EUA). This EUA will remain  in effect (meaning this test can be used) for the duration of the COVID-19 declaration under Section 564(b)(1) of the Act, 21 U.S.C.section 360bbb-3(b)(1), unless the authorization is terminated  or revoked sooner.       Influenza A by PCR NEGATIVE NEGATIVE Final   Influenza B by PCR NEGATIVE NEGATIVE Final    Comment: (NOTE) The Xpert Xpress SARS-CoV-2/FLU/RSV plus assay is intended as an  aid in the diagnosis of influenza from Nasopharyngeal swab specimens and should not be used as a sole basis for treatment. Nasal washings and aspirates are unacceptable for Xpert Xpress SARS-CoV-2/FLU/RSV testing.  Fact Sheet for Patients: EntrepreneurPulse.com.au  Fact Sheet for Healthcare Providers: IncredibleEmployment.be  This test is not yet approved or cleared by the [place] FDA and has been authorized for detection and/or diagnosis of SARS-CoV-2 by FDA under an Emergency Use Authorization (EUA). This EUA will remain in effect (meaning this test can be used) for the duration of the COVID-19 declaration under Section 564(b)(1) of the Act, 21 U.S.C. section 360bbb-3(b)(1), unless the authorization is terminated or revoked.  Performed at [facility], [address], [place], [place] [address]   Culture, blood (routine x 2) Call MD if unable to obtain prior to antibiotics being given  Status: None   Collection Time: 01/14/22 10:13 PM   Specimen: BLOOD RIGHT HAND  Result Value Ref Range Status   Specimen Description BLOOD RIGHT HAND BOTTLES DRAWN AEROBIC ONLY  Final   Special Requests   Final    Blood Culture results may not be optimal due to an inadequate volume of blood received in culture bottles   Culture   Final    NO GROWTH 5 DAYS Performed at [facility], [address], [place], [place] [address]    Report Status 01/19/2022 FINAL  Final  Culture, blood (routine x 2) Call MD if unable to obtain prior to antibiotics being given     Status: None   Collection Time: 01/14/22 10:22 PM   Specimen: Left Antecubital; Blood  Result Value Ref Range Status   Specimen Description   Final    LEFT ANTECUBITAL BOTTLES DRAWN AEROBIC AND ANAEROBIC   Special Requests Blood Culture adequate volume  Final   Culture   Final    NO GROWTH 5 DAYS Performed at [facility], [address], [place], [place] [address]    Report Status 01/19/2022  FINAL  Final  MRSA Next Gen by PCR, Nasal     Status: Abnormal   Collection Time: 01/14/22 11:19 PM   Specimen: Nasal Mucosa; Nasal Swab  Result Value Ref Range Status   MRSA by PCR Next Gen DETECTED (A) NOT DETECTED Final    Comment: RESULT CALLED TO, READ BACK BY AND VERIFIED WITH: WHITE,D @ 0102 ON 01/15/22 BY JUW Performed at [facility], [address] [place], [place] [address]   Culture, Respiratory w Gram Stain     Status: None   Collection Time: 01/17/22 11:45 AM   Specimen: Tracheal Aspirate; Respiratory  Result Value Ref Range Status   Specimen Description   Final    TRACHEAL ASPIRATE Performed at [facility], [address], [place], [place] [address]    Special Requests   Final    NONE Performed at [facility], [address] [place], [place] [address]    Gram Stain   Final    WBC PRESENT, PREDOMINANTLY MONONUCLEAR NO ORGANISMS SEEN Performed at [facility], [place] [address] [place], [place] [address]    Culture FEW CANDIDA TROPICALIS  Final   Report Status 01/19/2022 FINAL  Final  Urine Culture     Status: None   Collection Time: 01/19/22  6:00 PM   Specimen: Urine, Clean Catch  Result Value Ref Range Status   Specimen Description   Final    URINE, CLEAN CATCH Performed at [facility], [address] [place], [place] [address]    Special Requests   Final    NONE Performed at [facility], [address] [place], [place] [address]    Culture   Final    NO GROWTH Performed at [facility], [place] [address], [place], [place] [address]    Report Status 01/20/2022 FINAL  Final     Scheduled Meds:  budesonide  0.5 mg Nebulization BID   calcitRIOL  0.25 mcg Oral Daily   Chlorhexidine Gluconate Cloth  6 each Topical Daily   citalopram  20 mg Oral Daily   enoxaparin (LOVENOX) injection  1 mg/kg Subcutaneous T41D   folic acid  1 mg Oral Daily   ipratropium-albuterol  3 mL Nebulization Q6H   levothyroxine  150 mcg Oral Daily   metoprolol  tartrate  5 mg Intravenous Q6H   midodrine  5 mg  Oral TID WC   potassium chloride  40 mEq Oral Once   cyanocobalamin  500 mcg Oral Daily   Continuous Infusions:  sodium chloride Stopped (01/16/22 0814)   levETIRAcetam 500 mg (01/21/22 1158)    Procedures/Studies: MR BRAIN WO CONTRAST  Result Date: 01/20/2022 CLINICAL DATA:  Initial evaluation for mental status change. EXAM: MRI HEAD WITHOUT CONTRAST TECHNIQUE: Multiplanar, multiecho pulse sequences of the brain and surrounding structures were obtained without intravenous contrast. COMPARISON:  Prior CT from 01/19/2022. FINDINGS: Brain: Examination severely degraded by motion artifact, and is nearly nondiagnostic. Cerebral volume within normal limits. No visible foci of restricted diffusion to suggest acute or subacute ischemia. No visible acute or chronic intracranial blood products. Previously identified meningioma centered at the right parietal convexity again seen, measuring up to approximately 3.7 cm in size. Associated regional mass effect with vasogenic edema, similar to prior CT. No other visible mass lesion or mass effect. No significant midline shift. Ventricles grossly within normal limits for size without hydrocephalus. No extra-axial fluid collection. Pituitary gland suprasellar region grossly within normal limits. Vascular: Major intracranial vascular flow voids are grossly maintained at the skull base, although poorly assessed due to significant motion. Skull and upper cervical spine: Craniocervical junction within normal limits. Bone marrow signal intensity grossly normal. No scalp soft tissue abnormality. Sinuses/Orbits: Globes and orbital soft tissues grossly within normal limits. Paranasal sinuses are largely clear. No obvious significant mastoid effusion. Other: None. IMPRESSION: 1. Severely motion degraded and nearly nondiagnostic examination. 2. 3.7 cm right parietal convexity meningioma with associated vasogenic edema, similar to  prior. 3. No other definite acute intracranial abnormality. Electronically Signed   By: Jeannine Boga M.D.   On: 01/20/2022 23:02   CT HEAD WO CONTRAST (5MM)  Result Date: 01/19/2022 CLINICAL DATA:  Altered mental status. EXAM: CT HEAD WITHOUT CONTRAST TECHNIQUE: Contiguous axial images were obtained from the base of the skull through the vertex without intravenous contrast. RADIATION DOSE REDUCTION: This exam was performed according to the departmental dose-optimization program which includes automated exposure control, adjustment of the mA and/or kV according to patient size and/or use of iterative reconstruction technique. COMPARISON:  Head CT dated 11/28/2020. FINDINGS: Evaluation of this exam is limited due to motion artifact. 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 This is relatively unchanged compared to prior CT. There is associated mass effect and edema in the right parietal lobe also similar to prior CT. There is no acute intracranial hemorrhage. No midline shift. No extra-axial fluid collection. Vascular: No hyperdense vessel or unexpected calcification. Skull: Normal. Negative for fracture or focal lesion. Sinuses/Orbits: No acute finding. Other: None IMPRESSION: 1. No acute intracranial hemorrhage. 2. No significant interval change in the size of the right parietal mass with associated mass effect and edema. Electronically Signed   By: Anner Crete M.D.   On: 01/19/2022 18:31   DG CHEST PORT 1 VIEW  Result Date: 01/18/2022 CLINICAL DATA:  [address].  Shortness of breath. EXAM: PORTABLE CHEST 1 VIEW COMPARISON:  Portable 01/16/2022 at 8:37 a.m. FINDINGS: 4:56 a.m. An oxygen mask superimposes over the left apical area. There is a tracheostomy cannula with the tip 5.4 cm from the carina. There is a low inspiration. There is patchy consolidation, atelectasis or  combination in the lower lung fields overlying small pleural effusions. The upper lung fields remain clear. No new or worsened opacity is seen. There is  mild cardiomegaly without CHF. Stable mediastinum with mild aortic atherosclerosis. Degenerative change thoracic spine and asymmetrically of the right shoulder. IMPRESSION: There previously was interstitial edema which is not seen today. Bibasilar opacities, atelectasis and/or pneumonia, and small underlying pleural effusions appear similar. Overall aeration is unchanged. Electronically Signed   By: Telford Nab M.D.   On: 01/18/2022 06:02   DG Chest Port 1 View  Result Date: 01/16/2022 CLINICAL DATA:  Respiratory distress EXAM: PORTABLE CHEST 1 VIEW COMPARISON:  Chest radiograph 01/15/2019 FINDINGS: Increased bilateral pleural effusions, left-greater-than-right. Right basilar linear and left basilar consolidative opacities may represent atelectasis, but superimposed infection is difficult to exclude. Increased interstitial opacities and peribronchial wall thickening are suggestive of mild pulmonary edema. There is a tracheostomy in place. Visualized upper abdomen is unremarkable. No acute osseous abnormality. IMPRESSION: New mild pulmonary edema with moderate left and small right pleural effusions. Bibasilar opacities may represent atelectasis in the setting of worsening pleural effusions, but a component of superimposed infection is difficult to exclude. Electronically Signed   By: Marin Roberts M.D.   On: 01/16/2022 09:01   DG Chest Port 1 View  Result Date: 01/14/2022 CLINICAL DATA:  sob EXAM: PORTABLE CHEST 1 VIEW.  Patient is rotated. COMPARISON:  Chest x-ray 01/03/2022, CT chest 01/04/2022 FINDINGS: Tracheostomy tube with tip terminating 8.5 cm above the carina. The heart and mediastinal contours are grossly unchanged. Low lung volumes persistent retrocardiac airspace opacity. No pulmonary edema. Similar-appearing blunting of bilateral costophrenic  angles with no significant pleural effusion. No pneumothorax. No acute osseous abnormality. IMPRESSION: 1. Low lung volumes, persistent retrocardiac airspace opacity with limited evaluation due to patient rotation. 2. Similar-appearing blunting of bilateral costophrenic angles with no significant pleural effusion. Electronically Signed   By: Iven Finn M.D.   On: 01/14/2022 19:49   ECHOCARDIOGRAM COMPLETE  Result Date: 01/07/2022    ECHOCARDIOGRAM REPORT   Patient Name:   SHUNTIA EXTON Date of Exam: 01/07/2022 Medical Rec #:  [id number]       Height:       65.5 in Accession #:    [id number]      Weight:       255.0 lb Date of Birth:  03-25-1961       BSA:          2.206 m Patient Age:    28 years        BP:           99/48 mmHg Patient Gender: F               HR:           72 bpm. Exam Location:  [facility] Procedure: 2D Echo, Color Doppler, Cardiac Doppler and Intracardiac            Opacification Agent Indications:    X32.44 Acute diastolic (congestive) heart failure  History:        Patient has prior history of Echocardiogram examinations, most                 recent 04/19/2021. CHF, Arrythmias:Atrial Fibrillation; Risk                 Factors:Hypertension, Diabetes, Dyslipidemia and Current Smoker.  Sonographer:    Raquel Sarna Senior RDCS Referring Phys: [phone] Thurza Kwiecinski  Sonographer Comments: Technically difficult due to patient body habitus. Patient is morbidly obese. IMPRESSIONS  1. Left ventricular ejection fraction, by estimation, is 65 to 70%. The left ventricle has normal function. The  left ventricle has no regional wall motion abnormalities. Left ventricular diastolic parameters were normal.  2. Right ventricular systolic function is normal. The right ventricular size is normal. Tricuspid regurgitation signal is inadequate for assessing PA pressure.  3. The mitral valve is normal in structure. No evidence of mitral valve regurgitation. No evidence of mitral stenosis.  4. The aortic valve was not well  visualized. Aortic valve regurgitation is not visualized. No aortic stenosis is present.  5. The inferior vena cava is dilated in size with <50% respiratory variability, suggesting right atrial pressure of 15 mmHg. FINDINGS  Left Ventricle: Left ventricular ejection fraction, by estimation, is 65 to 70%. The left ventricle has normal function. The left ventricle has no regional wall motion abnormalities. Definity contrast agent was given IV to delineate the left ventricular  endocardial borders. The left ventricular internal cavity size was normal in size. There is no left ventricular hypertrophy. Left ventricular diastolic parameters were normal. Right Ventricle: The right ventricular size is normal. Right vetricular wall thickness was not well visualized. Right ventricular systolic function is normal. Tricuspid regurgitation signal is inadequate for assessing PA pressure. Left Atrium: Left atrial size was normal in size. Right Atrium: Right atrial size was normal in size. Pericardium: There is no evidence of pericardial effusion. Mitral Valve: The mitral valve is normal in structure. No evidence of mitral valve regurgitation. No evidence of mitral valve stenosis. Tricuspid Valve: The tricuspid valve is not well visualized. Tricuspid valve regurgitation is trivial. No evidence of tricuspid stenosis. Aortic Valve: The aortic valve was not well visualized. Aortic valve regurgitation is not visualized. No aortic stenosis is present. Aortic valve mean gradient measures 4.0 mmHg. Aortic valve peak gradient measures 9.3 mmHg. Aortic valve area, by VTI measures 2.33 cm. Pulmonic Valve: The pulmonic valve was not well visualized. Pulmonic valve regurgitation is not visualized. No evidence of pulmonic stenosis. Aorta: The aortic root is normal in size and structure. Venous: The inferior vena cava is dilated in size with less than 50% respiratory variability, suggesting right atrial pressure of 15 mmHg. IAS/Shunts: The  interatrial septum was not well visualized.  LEFT VENTRICLE PLAX 2D LVIDd:         4.60 cm LVIDs:         2.50 cm LV PW:         0.80 cm LV IVS:        1.00 cm LVOT diam:     1.80 cm LV SV:         67 LV SV Index:   30 LVOT Area:     2.54 cm  RIGHT VENTRICLE RV S prime:     13.30 cm/s TAPSE (M-mode): 2.0 cm LEFT ATRIUM             Index        RIGHT ATRIUM           Index LA diam:        3.60 cm 1.63 cm/m   RA Area:     20.30 cm LA Vol (A2C):   45.4 ml 20.58 ml/m  RA Volume:   64.40 ml  29.20 ml/m LA Vol (A4C):   63.1 ml 28.61 ml/m LA Biplane Vol: 54.0 ml 24.48 ml/m  AORTIC VALVE AV Area (Vmax):    2.06 cm AV Area (Vmean):   2.15 cm AV Area (VTI):     2.33 cm AV Vmax:           152.23 cm/s AV Vmean:  92.193 cm/s AV VTI:            0.289 m AV Peak Grad:      9.3 mmHg AV Mean Grad:      4.0 mmHg LVOT Vmax:         123.00 cm/s LVOT Vmean:        78.000 cm/s LVOT VTI:          0.264 m LVOT/AV VTI ratio: 0.91  AORTA Ao Root diam: 3.00 cm Ao Asc diam:  2.90 cm  SHUNTS Systemic VTI:  0.26 m Systemic Diam: 1.80 cm Carlyle Dolly MD Electronically signed by Carlyle Dolly MD Signature Date/Time: 01/07/2022/3:23:07 PM    Final    CT CHEST WO CONTRAST  Result Date: 01/04/2022 CLINICAL DATA:  A 61 year old female with chronic dyspnea presents for evaluation in the setting of abnormal chest radiograph. EXAM: CT CHEST WITHOUT CONTRAST TECHNIQUE: Multidetector CT imaging of the chest was performed following the standard protocol without IV contrast. RADIATION DOSE REDUCTION: This exam was performed according to the departmental dose-optimization program which includes automated exposure control, adjustment of the mA and/or kV according to patient size and/or use of iterative reconstruction technique. COMPARISON:  December of 2021 FINDINGS: Cardiovascular: Scattered aortic atherosclerosis. Three-vessel coronary artery disease. Normal heart size without pericardial effusion or nodularity. Aberrant RIGHT  subclavian artery. Mild engorgement of central pulmonary vasculature. Limited assessment of cardiovascular structures given lack of intravenous contrast. Mediastinum/Nodes: Tracheostomy tube terminating in the proximal trachea. No signs of adenopathy in the chest. No acute process in the mediastinum. Esophagus is grossly normal. Lungs/Pleura: Patchy airspace disease in the chest in LEFT upper lobe, lingula, LEFT lower lobe and RIGHT lung base. Volume loss in the LEFT lung base associated also with consolidative changes and material in bronchial structures. No pneumothorax. Trace pleural effusions. Airways are otherwise patent. Upper Abdomen: Incidental imaging of upper abdominal contents without acute process to the extent evaluated. Musculoskeletal: Spinal degenerative changes. No acute or destructive bone process. Rib fractures of varying ages, no acute fractures most are chronic or subacute and without displacement. Fracture at the T9 level with 20-30% loss of height. Vacuum phenomenon within the fracture site. No surrounding stranding. T11 with loss of height as well. These have occurred since December of 2021. No listhesis. IMPRESSION: 1. Multifocal pneumonia with acute on chronic sequela of aspiration favored at the LEFT lung base. 2. Age-indeterminate compression fractures potentially subacute/recent at the T9 level and T11 level are new since 2021. Correlate with any history of prior fracture to these areas and with any current symptoms. 3. Aortic atherosclerosis and coronary artery disease. Aortic Atherosclerosis (ICD10-I70.0). Electronically Signed   By: Zetta Bills M.D.   On: 01/04/2022 17:23   DG Chest Portable 1 View  Result Date: 01/03/2022 CLINICAL DATA:  Provided history: Shortness of breath. EXAM: PORTABLE CHEST 1 VIEW COMPARISON:  Prior chest radiographs 08/08/2021 and earlier. FINDINGS: A tracheostomy tube is noted. Cardiomegaly. Central pulmonary vascular congestion. Prominence of the  interstitial lung markings bilaterally, compatible with pulmonary interstitial edema. Irregular and nodular opacities within the right lung base. The nodular opacities measure up to 16 mm. Irregular opacities within the left lung base. No evidence of pneumothorax. No acute bony abnormality identified. IMPRESSION: Cardiomegaly with pulmonary interstitial edema. Opacities within bilateral lung bases, which may reflect atelectasis and/or pneumonia. Superimposed more nodular opacities within the right lung base. A chest CT is recommended to better assess for possible pulmonary nodules at this site. Small left pleural effusion, which  may be partially loculated. Electronically Signed   By: Kellie Simmering D.O.   On: 01/03/2022 14:50   US Venous Img Lower Bilateral  Result Date: 01/03/2022 CLINICAL DATA:  Bilateral open wounds. EXAM: BILATERAL LOWER EXTREMITY VENOUS DOPPLER ULTRASOUND TECHNIQUE: Gray-scale sonography with graded compression, as well as color Doppler and duplex ultrasound were performed to evaluate the lower extremity deep venous systems from the level of the common femoral vein and including the common femoral, femoral, profunda femoral, popliteal and calf veins including the posterior tibial, peroneal and gastrocnemius veins when visible. The superficial great saphenous vein was also interrogated. Spectral Doppler was utilized to evaluate flow at rest and with distal augmentation maneuvers in the common femoral, femoral and popliteal veins. COMPARISON:  None Available. FINDINGS: RIGHT LOWER EXTREMITY Common Femoral Vein: No evidence of thrombus. Normal compressibility, respiratory phasicity and response to augmentation. Saphenofemoral Junction: No evidence of thrombus. Normal compressibility and flow on color Doppler imaging. Profunda Femoral Vein: No evidence of thrombus. Normal compressibility and flow on color Doppler imaging. Femoral Vein: No evidence of thrombus. Normal compressibility, respiratory  phasicity and response to augmentation. Popliteal Vein: No evidence of thrombus. Normal compressibility, respiratory phasicity and response to augmentation. Calf Veins: Limited evaluation. Superficial Great Saphenous Vein: Limited evaluation. Other Findings:  Large amount of subcutaneous edema. LEFT LOWER EXTREMITY Common Femoral Vein: No evidence of thrombus. Normal compressibility, respiratory phasicity and response to augmentation. Saphenofemoral Junction: No evidence of thrombus. Normal compressibility and flow on color Doppler imaging. Profunda Femoral Vein: No evidence of thrombus. Normal compressibility and flow on color Doppler imaging. Femoral Vein: No evidence of thrombus. Normal compressibility, respiratory phasicity and response to augmentation. Popliteal Vein: No evidence of thrombus. Normal compressibility, respiratory phasicity and response to augmentation. Calf Veins: Limited evaluation. Superficial Great Saphenous Vein: No evidence of thrombus. Normal compressibility. Other Findings:  Subcutaneous edema. IMPRESSION: No evidence of deep venous thrombosis in either lower extremity. However, the study has technical limitations due to body habitus and edema. Limited evaluation the bilateral deep calf veins. Electronically Signed   By: Markus Daft M.D.   On: 01/03/2022 13:45    Orson Eva, DO  [facility]  If 7PM-7AM, please contact night-coverage [email or web address] Password [facility] 01/21/2022, 12:59 PM   LOS: 7 days

## 2022-01-21 NOTE — Progress Notes (Signed)
Met with son at bedside and discussed [place] -we discussed patient's overall declining medical condition despite optimal therapy.  We discussed that patient is worse than just a few weeks ago.  We discussed her overall poor prognosis in the setting of her poor baseline function, numerous significant comorbidities and overall FTT and declining health condition.  We discussed her trajectory for recurrent and frequent hospitalizations.  He stated that patient wound not have wanted to live such a poor quality of life. He agrees with DNR and wishes to pursue full comfort measure approach. Plan to consult TOC for transition to residential hospice.  D/c all labs and radiographic studies  DTat

## 2022-01-22 DIAGNOSIS — G9341 Metabolic encephalopathy: Secondary | ICD-10-CM | POA: Diagnosis not present

## 2022-01-22 DIAGNOSIS — J690 Pneumonitis due to inhalation of food and vomit: Secondary | ICD-10-CM | POA: Diagnosis not present

## 2022-01-22 DIAGNOSIS — N179 Acute kidney failure, unspecified: Secondary | ICD-10-CM | POA: Diagnosis not present

## 2022-01-22 DIAGNOSIS — I5031 Acute diastolic (congestive) heart failure: Secondary | ICD-10-CM | POA: Diagnosis not present

## 2022-01-22 LAB — CBC
HCT: 33.1 % — ABNORMAL LOW (ref 36.0–46.0)
Hemoglobin: 10 g/dL — ABNORMAL LOW (ref 12.0–15.0)
MCH: 28.7 pg (ref 26.0–34.0)
MCHC: 30.2 g/dL (ref 30.0–36.0)
MCV: 95.1 fL (ref 80.0–100.0)
Platelets: 324 10*3/uL (ref 150–400)
RBC: 3.48 MIL/uL — ABNORMAL LOW (ref 3.87–5.11)
RDW: 20.2 % — ABNORMAL HIGH (ref 11.5–15.5)
WBC: 6.4 10*3/uL (ref 4.0–10.5)
nRBC: 0 % (ref 0.0–0.2)

## 2022-01-22 LAB — BASIC METABOLIC PANEL
Anion gap: 11 (ref 5–15)
BUN: 31 mg/dL — ABNORMAL HIGH (ref 8–23)
CO2: 26 mmol/L (ref 22–32)
Calcium: 9 mg/dL (ref 8.9–10.3)
Chloride: 104 mmol/L (ref 98–111)
Creatinine, Ser: 3.39 mg/dL — ABNORMAL HIGH (ref 0.44–1.00)
GFR, Estimated: 15 mL/min — ABNORMAL LOW (ref >60)
Glucose, Bld: 83 mg/dL (ref 70–99)
Potassium: 2.9 mmol/L — ABNORMAL LOW (ref 3.5–5.1)
Sodium: 141 mmol/L (ref 135–145)

## 2022-01-22 MED ORDER — NYSTATIN 100000 UNIT/GM EX POWD
Freq: Two times a day (BID) | CUTANEOUS | Status: DC
  Filled 2022-01-22 (×3): qty 15

## 2022-01-22 NOTE — Progress Notes (Signed)
PROGRESS NOTE  Veronica Horton IHK:[id number] DOB: [birth year] DOA: 01/14/2022 PCP: [facility]  Brief History:  61 year old female with a history of COPD, tobacco abuse, chronic respiratory failure with tracheostomy on 6-8 L at home, laryngeal cancer status post tracheostomy, seizure disorder, GERD, hypertension, orthostatic hypotension presenting with generalized weakness and increasing left lower extremity redness and pain.  The patient visited the emergency department at [facility] on 12/27/2026 with lower extremity edema and pain.  The patient was discharged home from the ED with clindamycin with which she had been compliant.  She continued to have left lower extremity pain and edema.  She stated that she had her left leg against the wall approximately 1 week prior to this admission.  She denies any other recent injuries or falls.  She uses a walker at baseline.  She continues to smoke 1/2 pack/day Although the patient did not formally leave [place], she aggressively advocated for discharge on 01/09/2022. On 01/10/2022, the patient slid out of bed and went to the ED at [facility].  She refused any blood work and was subsequently discharged home. She presented again on 01/14/2022 with shortness of breath with nausea and vomiting.  Chest x-ray showed increased interstitial markings with moderate left pleural effusion and small right pleural effusion.  The patient was started on Lasix.  She was also started on vancomycin and cefepime for pneumonia.  Since admission to the hospital, the patient's condition has waxed and waned without much improvement.  Her mentation also has been waxing and waning.  Palliative medicine was consulted.  Pulmonary medicine was consulted.  She was started antibiotics and lasix.  Although she remained afebrile and hemodynamically stable, her overall medical condition continued to deteriorate with decline in mental status and refusing po  intake.  [place] discussions were held with sister and boyfriend.  They abdicated further decision making to patient's son.  [name] was contacted and favors a more comfort focus approach.     Assessment and Plan:  Acute on chronic respiratory failure with hypoxia and hypercarbia -Multifactorial including aspiration pneumonitis, COPD, fluid overload -The patient is noncompliant with her prescribed diet -Currently stable on 8 L -She is chronically on 8 L at home -after [place] discussion with patient and son>>goal now is to focus on comfort measure hopeful for transition to residential hospice   Aspiration pneumonitis ([place]) CT chest on 01/04/2022 showed Multifocal pneumonia with acute on chronic sequela of aspiration 01/16/22 Speech therapy eval>>dys 3 with thin -MRSA+ -on vanc/cefepime--had 6 days -stopped cefepime due to altered mental status --after [place] discussion with patient and son>>goal now is to focus on comfort measure hopeful for transition to residential hospice   Acute on chronic diastolic CHF (congestive heart failure) ([place]) Clinically fluid overloaded initially with pleural effusions and interstitial edema transition to IV lasix as pt intermittently refuses pills>>d/c 9/22 up to uptrending creatinine and euvolemia -01/08/22 echo--EF 65 to 70%, no WMA. Normal RV; trivial TR -after GOC discussion with patient and son>>goal now is to focus on comfort measure hopeful for transition to residential hospice   Hypokalemia -replete -mag 1.9   Acute renal failure superimposed on stage 3b chronic kidney disease ([place]) Baseline creatinine 1.6-1.8 Presented with serum creatinine 2.44 Now fluid overloaded>>started IV lasix initially>>stop on 9/22 -after [place] discussion with patient and son>>goal now is to focus on comfort measure hopeful for transition to residential hospice   Acute metabolic Encephalopathy -remains  confused and agitated -B12 1968 -folate 31.4 -TSH 16.36, Free T4 =  1.12 -ammonia <10 -VBG 7.48/37/76/27 -obtain UA--no pyuria -order CT brain--No significant interval change in the size of the right parietal mass with associated mass effect and edema -MR brain--3.7 cm right parietal convexity meningioma with associated vasogenic edema, similar to prior; no other acute findings   Laryngeal cancer [facility]) - Patient has expressed not interested in further evaluation or treatment for the cancer -Palliative has seen her in the outpatient setting and planning to establish care more consistently -She is DNR. -after [place] discussion with patient and son>>goal now is to focus on comfort measure hopeful for transition to residential hospice   Tracheostomy status ([place]) CHRONIC RESPIRATORY FAILURE WITH HYPOXIA - ON 6-8 L AT [place] in place; adequate saturation -stable   Tobacco abuse Tobacco cessation discussed   Class 3 obesity (HCC) -Lifestyle modification -Body mass index is 41.79 kg/m.    Hypertension -Stable overall -started IV lopressor--pt refusing po meds   Goals of Care Met with son at bedside and discussed GOC -we discussed patient's overall declining medical condition despite optimal therapy.  We discussed that patient is worse than just a few weeks ago.  We discussed her overall poor prognosis in the setting of her poor baseline function, numerous significant comorbidities and overall FTT and declining health condition.  We discussed her trajectory for recurrent and frequent hospitalizations.  He stated that patient wound not have wanted to live such a poor quality of life. He agrees with DNR and wishes to pursue full comfort measure approach. Plan to consult TOC for transition to residential hospice.  D/c all labs and radiographic studies     Family Communication:  son updated 9/23   Consultants:  palliative, pulm   Code Status:  DNR   DVT Prophylaxis: apixaban     Procedures: As Listed in Progress Note Above    Antibiotics: None Cefepime 9/16>>9/22 Vanc 9/17>>9/22      Subjective: Patient denies fevers, chills, headache, chest pain, dyspnea, nausea, vomiting, diarrhea, abdominal pain,   Objective: Vitals:   01/22/22 0622 01/22/22 0807 01/22/22 0811 01/22/22 1326  BP: 128/63     Pulse: 72     Resp: 18     Temp: 98 F (36.7 C)     TempSrc:      SpO2: 90% (!) 77% (!) 89% (!) 88%  Weight:      Height:        Intake/Output Summary (Last 24 hours) at 01/22/2022 1618 Last data filed at 01/22/2022 1500 Gross per 24 hour  Intake 500 ml  Output --  Net 500 ml   Weight change:  Exam:  General:  Pt is alert, follows commands appropriately, not in acute distress HEENT: No icterus, No thrush, No neck mass, [place]/AT Cardiovascular: RRR, S1/S2, no rubs, no gallops Respiratory: bilateral rhonchi Abdomen: Soft/+BS, non tender, non distended, no guarding Extremities: 1 + LE edema, Right lower posterior thigh hematoma   Data Reviewed: I have personally reviewed following labs and imaging studies Basic Metabolic Panel: Recent Labs  Lab 01/18/22 0444 01/18/22 2233 01/19/22 0655 01/20/22 0318 01/21/22 0426 01/22/22 0505  NA 142 143 141  --  146* 141  K 3.2* 3.0* 3.1*  --  3.2* 2.9*  CL 105 104 107  --  108 104  CO2 $Re'28 26 24  'WSp$ --  26 26  GLUCOSE 84 76 85  --  75 83  BUN 23 24* 22  --  28*  31*  CREATININE 1.97* 2.10* 2.05* 2.48* 3.07* 3.39*  CALCIUM 8.7* 8.8* 8.6*  --  8.9 9.0  MG  --  2.0 1.9  --  2.0  --    Liver Function Tests: Recent Labs  Lab 01/21/22 0426  AST 16  ALT 12  ALKPHOS 104  BILITOT 1.0  PROT 6.5  ALBUMIN 2.5*   No results for input(s): "LIPASE", "AMYLASE" in the last 168 hours. Recent Labs  Lab 01/19/22 0655  AMMONIA <10   Coagulation Profile: No results for input(s): "INR", "PROTIME" in the last 168 hours. CBC: Recent Labs  Lab 01/18/22 0444 01/19/22 0655 01/21/22 0426 01/22/22 0505  WBC 5.5 6.1 5.2 6.4  HGB 9.5* 9.0* 9.8* 10.0*  HCT 31.9*  29.2* 32.6* 33.1*  MCV 95.8 94.8 96.2 95.1  PLT 325 359 336 324   Cardiac Enzymes: No results for input(s): "CKTOTAL", "CKMB", "CKMBINDEX", "TROPONINI" in the last 168 hours. BNP: Invalid input(s): "POCBNP" CBG: Recent Labs  Lab 01/20/22 1557 01/21/22 2154  GLUCAP 77 106*   HbA1C: No results for input(s): "HGBA1C" in the last 72 hours. Urine analysis:    Component Value Date/Time   COLORURINE STRAW (A) 01/19/2022 1800   APPEARANCEUR HAZY (A) 01/19/2022 1800   LABSPEC 1.009 01/19/2022 1800   PHURINE 5.0 01/19/2022 1800   GLUCOSEU NEGATIVE 01/19/2022 1800   HGBUR MODERATE (A) 01/19/2022 1800   BILIRUBINUR NEGATIVE 01/19/2022 1800   KETONESUR 5 (A) 01/19/2022 1800   PROTEINUR NEGATIVE 01/19/2022 1800   UROBILINOGEN 0.2 09/25/2010 0530   NITRITE NEGATIVE 01/19/2022 1800   LEUKOCYTESUR NEGATIVE 01/19/2022 1800   Sepsis Labs: $RemoveBefo'@LABRCNTIP'bUmWResHmyU$ (procalcitonin:4,lacticidven:4) ) Recent Results (from the past 240 hour(s))  Resp Panel by RT-PCR (Flu A&B, Covid) Anterior Nasal Swab     Status: None   Collection Time: 01/14/22  9:03 PM   Specimen: Anterior Nasal Swab  Result Value Ref Range Status   SARS Coronavirus 2 by RT PCR NEGATIVE NEGATIVE Final    Comment: (NOTE) SARS-CoV-2 target nucleic acids are NOT DETECTED.  The SARS-CoV-2 RNA is generally detectable in upper respiratory specimens during the acute phase of infection. The lowest concentration of SARS-CoV-2 viral copies this assay can detect is 138 copies/mL. A negative result does not preclude SARS-Cov-2 infection and should not be used as the sole basis for treatment or other patient management decisions. A negative result may occur with  improper specimen collection/handling, submission of specimen other than nasopharyngeal swab, presence of viral mutation(s) within the areas targeted by this assay, and inadequate number of viral copies(<138 copies/mL). A negative result must be combined with clinical observations,  patient history, and epidemiological information. The expected result is Negative.  Fact Sheet for Patients:  EntrepreneurPulse.com.au  Fact Sheet for Healthcare Providers:  IncredibleEmployment.be  This test is no t yet approved or cleared by the [place] FDA and  has been authorized for detection and/or diagnosis of SARS-CoV-2 by FDA under an Emergency Use Authorization (EUA). This EUA will remain  in effect (meaning this test can be used) for the duration of the COVID-19 declaration under Section 564(b)(1) of the Act, 21 U.S.C.section 360bbb-3(b)(1), unless the authorization is terminated  or revoked sooner.       Influenza A by PCR NEGATIVE NEGATIVE Final   Influenza B by PCR NEGATIVE NEGATIVE Final    Comment: (NOTE) The Xpert Xpress SARS-CoV-2/FLU/RSV plus assay is intended as an aid in the diagnosis of influenza from Nasopharyngeal swab specimens and should not be used as a sole basis  for treatment. Nasal washings and aspirates are unacceptable for Xpert Xpress SARS-CoV-2/FLU/RSV testing.  Fact Sheet for Patients: EntrepreneurPulse.com.au  Fact Sheet for Healthcare Providers: IncredibleEmployment.be  This test is not yet approved or cleared by the [place] FDA and has been authorized for detection and/or diagnosis of SARS-CoV-2 by FDA under an Emergency Use Authorization (EUA). This EUA will remain in effect (meaning this test can be used) for the duration of the COVID-19 declaration under Section 564(b)(1) of the Act, 21 U.S.C. section 360bbb-3(b)(1), unless the authorization is terminated or revoked.  Performed at [facility], [address], [place], [place] [address]   Culture, blood (routine x 2) Call MD if unable to obtain prior to antibiotics being given     Status: None   Collection Time: 01/14/22 10:13 PM   Specimen: BLOOD RIGHT HAND  Result Value Ref Range Status   Specimen  Description BLOOD RIGHT HAND BOTTLES DRAWN AEROBIC ONLY  Final   Special Requests   Final    Blood Culture results may not be optimal due to an inadequate volume of blood received in culture bottles   Culture   Final    NO GROWTH 5 DAYS Performed at [name], [address], [place], [place] [address]    Report Status 01/19/2022 FINAL  Final  Culture, blood (routine x 2) Call MD if unable to obtain prior to antibiotics being given     Status: None   Collection Time: 01/14/22 10:22 PM   Specimen: Left Antecubital; Blood  Result Value Ref Range Status   Specimen Description   Final    LEFT ANTECUBITAL BOTTLES DRAWN AEROBIC AND ANAEROBIC   Special Requests Blood Culture adequate volume  Final   Culture   Final    NO GROWTH 5 DAYS Performed at [facility], [address], [place], [place] [address]    Report Status 01/19/2022 FINAL  Final  MRSA Next Gen by PCR, Nasal     Status: Abnormal   Collection Time: 01/14/22 11:19 PM   Specimen: Nasal Mucosa; Nasal Swab  Result Value Ref Range Status   MRSA by PCR Next Gen DETECTED (A) NOT DETECTED Final    Comment: RESULT CALLED TO, READ BACK BY AND VERIFIED WITH: WHITE,D @ 0102 ON 01/15/22 BY JUW Performed at [facility], [address], [place], [place] [address]   Culture, Respiratory w Gram Stain     Status: None   Collection Time: 01/17/22 11:45 AM   Specimen: Tracheal Aspirate; Respiratory  Result Value Ref Range Status   Specimen Description   Final    TRACHEAL ASPIRATE Performed at [facility], [address], [place], [place] [address]    Special Requests   Final    NONE Performed at [facility], [address], [place], [place] [address]    Gram Stain   Final    WBC PRESENT, PREDOMINANTLY MONONUCLEAR NO ORGANISMS SEEN Performed at [facility], [place] [address] [place], [place] [address]    Culture FEW CANDIDA TROPICALIS  Final   Report Status 01/19/2022 FINAL  Final  Urine Culture     Status: None   Collection  Time: 01/19/22  6:00 PM   Specimen: Urine, Clean Catch  Result Value Ref Range Status   Specimen Description   Final    URINE, CLEAN CATCH Performed at [facility], [address] [place], [place] [address]    Special Requests   Final    NONE Performed at [facility], [address], [place], [place] [address]    Culture   Final    NO GROWTH Performed at [facility], [place] [address], [place], [place] [address]    Report Status 01/20/2022 FINAL  Final     Scheduled Meds:  ipratropium-albuterol  3 mL Nebulization TID   levothyroxine  75 mcg Intravenous Daily   nystatin   Topical BID   potassium chloride  40 mEq Oral Once   Continuous Infusions:  levETIRAcetam 500 mg (01/22/22 0907)    Procedures/Studies: MR BRAIN WO CONTRAST  Result Date: 01/20/2022 CLINICAL DATA:  Initial evaluation for mental status change. EXAM: MRI HEAD WITHOUT CONTRAST TECHNIQUE: Multiplanar, multiecho pulse sequences of the brain and surrounding structures were obtained without intravenous contrast. COMPARISON:  Prior CT from 01/19/2022. FINDINGS: Brain: Examination severely degraded by motion artifact, and is nearly nondiagnostic. Cerebral volume within normal limits. No visible foci of restricted diffusion to suggest acute or subacute ischemia. No visible acute or chronic intracranial blood products. Previously identified meningioma centered at the right parietal convexity again seen, measuring up to approximately 3.7 cm in size. Associated regional mass effect with vasogenic edema, similar to prior CT. No other visible mass lesion or mass effect. No significant midline shift. Ventricles grossly within normal limits for size without hydrocephalus. No extra-axial fluid collection. Pituitary gland suprasellar region grossly within normal limits. Vascular: Major intracranial vascular flow voids are grossly maintained at the skull base, although poorly assessed due to significant motion. Skull and upper  cervical spine: Craniocervical junction within normal limits. Bone marrow signal intensity grossly normal. No scalp soft tissue abnormality. Sinuses/Orbits: Globes and orbital soft tissues grossly within normal limits. Paranasal sinuses are largely clear. No obvious significant mastoid effusion. Other: None. IMPRESSION: 1. Severely motion degraded and nearly nondiagnostic examination. 2. 3.7 cm right parietal convexity meningioma with associated vasogenic edema, similar to prior. 3. No other definite acute intracranial abnormality. Electronically Signed   By: Jeannine Boga M.D.   On: 01/20/2022 23:02   CT HEAD WO CONTRAST (5MM)  Result Date: 01/19/2022 CLINICAL DATA:  Altered mental status. EXAM: CT HEAD WITHOUT CONTRAST TECHNIQUE: Contiguous axial images were obtained from the base of the skull through the vertex without intravenous contrast. RADIATION DOSE REDUCTION: This exam was performed according to the departmental dose-optimization program which includes automated exposure control, adjustment of the mA and/or kV according to patient size and/or use of iterative reconstruction technique. COMPARISON:  Head CT dated 11/28/2020. FINDINGS: Evaluation of this exam is limited due to motion artifact. 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 This is relatively unchanged compared to prior CT. There is associated mass effect and edema in the right parietal lobe also similar to prior CT. There is no acute intracranial hemorrhage. No midline shift. No extra-axial fluid collection. Vascular: No hyperdense vessel or unexpected calcification. Skull: Normal. Negative for fracture or focal lesion. Sinuses/Orbits: No acute finding. Other: None IMPRESSION: 1. No acute intracranial hemorrhage. 2. No significant interval change in the size of the right parietal mass with associated mass effect and  edema. Electronically Signed   By: Anner Crete M.D.   On: 01/19/2022 18:31   DG CHEST PORT 1 VIEW  Result Date: 01/18/2022 CLINICAL DATA:  426[address].  Shortness of breath. EXAM: PORTABLE CHEST 1 VIEW COMPARISON:  Portable 01/16/2022 at 8:37 a.m. FINDINGS: 4:56 a.m. An oxygen mask superimposes over the left apical area.  There is a tracheostomy cannula with the tip 5.4 cm from the carina. There is a low inspiration. There is patchy consolidation, atelectasis or combination in the lower lung fields overlying small pleural effusions. The upper lung fields remain clear. No new or worsened opacity is seen. There is mild cardiomegaly without CHF. Stable mediastinum with mild aortic atherosclerosis. Degenerative change thoracic spine and asymmetrically of the right shoulder. IMPRESSION: There previously was interstitial edema which is not seen today. Bibasilar opacities, atelectasis and/or pneumonia, and small underlying pleural effusions appear similar. Overall aeration is unchanged. Electronically Signed   By: Telford Nab M.D.   On: 01/18/2022 06:02   DG Chest Port 1 View  Result Date: 01/16/2022 CLINICAL DATA:  Respiratory distress EXAM: PORTABLE CHEST 1 VIEW COMPARISON:  Chest radiograph 01/15/2019 FINDINGS: Increased bilateral pleural effusions, left-greater-than-right. Right basilar linear and left basilar consolidative opacities may represent atelectasis, but superimposed infection is difficult to exclude. Increased interstitial opacities and peribronchial wall thickening are suggestive of mild pulmonary edema. There is a tracheostomy in place. Visualized upper abdomen is unremarkable. No acute osseous abnormality. IMPRESSION: New mild pulmonary edema with moderate left and small right pleural effusions. Bibasilar opacities may represent atelectasis in the setting of worsening pleural effusions, but a component of superimposed infection is difficult to exclude. Electronically Signed   By: Marin Roberts  M.D.   On: 01/16/2022 09:01   DG Chest Port 1 View  Result Date: 01/14/2022 CLINICAL DATA:  sob EXAM: PORTABLE CHEST 1 VIEW.  Patient is rotated. COMPARISON:  Chest x-ray 01/03/2022, CT chest 01/04/2022 FINDINGS: Tracheostomy tube with tip terminating 8.5 cm above the carina. The heart and mediastinal contours are grossly unchanged. Low lung volumes persistent retrocardiac airspace opacity. No pulmonary edema. Similar-appearing blunting of bilateral costophrenic angles with no significant pleural effusion. No pneumothorax. No acute osseous abnormality. IMPRESSION: 1. Low lung volumes, persistent retrocardiac airspace opacity with limited evaluation due to patient rotation. 2. Similar-appearing blunting of bilateral costophrenic angles with no significant pleural effusion. Electronically Signed   By: Iven Finn M.D.   On: 01/14/2022 19:49   ECHOCARDIOGRAM COMPLETE  Result Date: 01/07/2022    ECHOCARDIOGRAM REPORT   Patient Name:   Veronica Horton Date of Exam: 01/07/2022 Medical Rec #:  [id number]       Height:       65.5 in Accession #:    [id number]      Weight:       255.0 lb Date of Birth:  12/29/1960       BSA:          2.206 m Patient Age:    3 years        BP:           99/48 mmHg Patient Gender: F               HR:           72 bpm. Exam Location:  [facility] Procedure: 2D Echo, Color Doppler, Cardiac Doppler and Intracardiac            Opacification Agent Indications:    M57.84 Acute diastolic (congestive) heart failure  History:        Patient has prior history of Echocardiogram examinations, most                 recent 04/19/2021. CHF, Arrythmias:Atrial Fibrillation; Risk                 Factors:Hypertension, Diabetes, Dyslipidemia and  Current Smoker.  Sonographer:    Raquel Sarna Senior RDCS Referring Phys: [phone] Karman Biswell  Sonographer Comments: Technically difficult due to patient body habitus. Patient is morbidly obese. IMPRESSIONS  1. Left ventricular ejection fraction, by estimation, is 65 to 70%.  The left ventricle has normal function. The left ventricle has no regional wall motion abnormalities. Left ventricular diastolic parameters were normal.  2. Right ventricular systolic function is normal. The right ventricular size is normal. Tricuspid regurgitation signal is inadequate for assessing PA pressure.  3. The mitral valve is normal in structure. No evidence of mitral valve regurgitation. No evidence of mitral stenosis.  4. The aortic valve was not well visualized. Aortic valve regurgitation is not visualized. No aortic stenosis is present.  5. The inferior vena cava is dilated in size with <50% respiratory variability, suggesting right atrial pressure of 15 mmHg. FINDINGS  Left Ventricle: Left ventricular ejection fraction, by estimation, is 65 to 70%. The left ventricle has normal function. The left ventricle has no regional wall motion abnormalities. Definity contrast agent was given IV to delineate the left ventricular  endocardial borders. The left ventricular internal cavity size was normal in size. There is no left ventricular hypertrophy. Left ventricular diastolic parameters were normal. Right Ventricle: The right ventricular size is normal. Right vetricular wall thickness was not well visualized. Right ventricular systolic function is normal. Tricuspid regurgitation signal is inadequate for assessing PA pressure. Left Atrium: Left atrial size was normal in size. Right Atrium: Right atrial size was normal in size. Pericardium: There is no evidence of pericardial effusion. Mitral Valve: The mitral valve is normal in structure. No evidence of mitral valve regurgitation. No evidence of mitral valve stenosis. Tricuspid Valve: The tricuspid valve is not well visualized. Tricuspid valve regurgitation is trivial. No evidence of tricuspid stenosis. Aortic Valve: The aortic valve was not well visualized. Aortic valve regurgitation is not visualized. No aortic stenosis is present. Aortic valve mean gradient  measures 4.0 mmHg. Aortic valve peak gradient measures 9.3 mmHg. Aortic valve area, by VTI measures 2.33 cm. Pulmonic Valve: The pulmonic valve was not well visualized. Pulmonic valve regurgitation is not visualized. No evidence of pulmonic stenosis. Aorta: The aortic root is normal in size and structure. Venous: The inferior vena cava is dilated in size with less than 50% respiratory variability, suggesting right atrial pressure of 15 mmHg. IAS/Shunts: The interatrial septum was not well visualized.  LEFT VENTRICLE PLAX 2D LVIDd:         4.60 cm LVIDs:         2.50 cm LV PW:         0.80 cm LV IVS:        1.00 cm LVOT diam:     1.80 cm LV SV:         67 LV SV Index:   30 LVOT Area:     2.54 cm  RIGHT VENTRICLE RV S prime:     13.30 cm/s TAPSE (M-mode): 2.0 cm LEFT ATRIUM             Index        RIGHT ATRIUM           Index LA diam:        3.60 cm 1.63 cm/m   RA Area:     20.30 cm LA Vol (A2C):   45.4 ml 20.58 ml/m  RA Volume:   64.40 ml  29.20 ml/m LA Vol (A4C):   63.1 ml 28.61 ml/m LA Biplane  Vol: 54.0 ml 24.48 ml/m  AORTIC VALVE AV Area (Vmax):    2.06 cm AV Area (Vmean):   2.15 cm AV Area (VTI):     2.33 cm AV Vmax:           152.23 cm/s AV Vmean:          92.193 cm/s AV VTI:            0.289 m AV Peak Grad:      9.3 mmHg AV Mean Grad:      4.0 mmHg LVOT Vmax:         123.00 cm/s LVOT Vmean:        78.000 cm/s LVOT VTI:          0.264 m LVOT/AV VTI ratio: 0.91  AORTA Ao Root diam: 3.00 cm Ao Asc diam:  2.90 cm  SHUNTS Systemic VTI:  0.26 m Systemic Diam: 1.80 cm Carlyle Dolly MD Electronically signed by Carlyle Dolly MD Signature Date/Time: 01/07/2022/3:23:07 PM    Final    CT CHEST WO CONTRAST  Result Date: 01/04/2022 CLINICAL DATA:  A 61 year old female with chronic dyspnea presents for evaluation in the setting of abnormal chest radiograph. EXAM: CT CHEST WITHOUT CONTRAST TECHNIQUE: Multidetector CT imaging of the chest was performed following the standard protocol without IV contrast.  RADIATION DOSE REDUCTION: This exam was performed according to the departmental dose-optimization program which includes automated exposure control, adjustment of the mA and/or kV according to patient size and/or use of iterative reconstruction technique. COMPARISON:  December of 2021 FINDINGS: Cardiovascular: Scattered aortic atherosclerosis. Three-vessel coronary artery disease. Normal heart size without pericardial effusion or nodularity. Aberrant RIGHT subclavian artery. Mild engorgement of central pulmonary vasculature. Limited assessment of cardiovascular structures given lack of intravenous contrast. Mediastinum/Nodes: Tracheostomy tube terminating in the proximal trachea. No signs of adenopathy in the chest. No acute process in the mediastinum. Esophagus is grossly normal. Lungs/Pleura: Patchy airspace disease in the chest in LEFT upper lobe, lingula, LEFT lower lobe and RIGHT lung base. Volume loss in the LEFT lung base associated also with consolidative changes and material in bronchial structures. No pneumothorax. Trace pleural effusions. Airways are otherwise patent. Upper Abdomen: Incidental imaging of upper abdominal contents without acute process to the extent evaluated. Musculoskeletal: Spinal degenerative changes. No acute or destructive bone process. Rib fractures of varying ages, no acute fractures most are chronic or subacute and without displacement. Fracture at the T9 level with 20-30% loss of height. Vacuum phenomenon within the fracture site. No surrounding stranding. T11 with loss of height as well. These have occurred since December of 2021. No listhesis. IMPRESSION: 1. Multifocal pneumonia with acute on chronic sequela of aspiration favored at the LEFT lung base. 2. Age-indeterminate compression fractures potentially subacute/recent at the T9 level and T11 level are new since 2021. Correlate with any history of prior fracture to these areas and with any current symptoms. 3. Aortic  atherosclerosis and coronary artery disease. Aortic Atherosclerosis (ICD10-I70.0). Electronically Signed   By: Zetta Bills M.D.   On: 01/04/2022 17:23   DG Chest Portable 1 View  Result Date: 01/03/2022 CLINICAL DATA:  Provided history: Shortness of breath. EXAM: PORTABLE CHEST 1 VIEW COMPARISON:  Prior chest radiographs 08/08/2021 and earlier. FINDINGS: A tracheostomy tube is noted. Cardiomegaly. Central pulmonary vascular congestion. Prominence of the interstitial lung markings bilaterally, compatible with pulmonary interstitial edema. Irregular and nodular opacities within the right lung base. The nodular opacities measure up to 16 mm. Irregular opacities within the left  lung base. No evidence of pneumothorax. No acute bony abnormality identified. IMPRESSION: Cardiomegaly with pulmonary interstitial edema. Opacities within bilateral lung bases, which may reflect atelectasis and/or pneumonia. Superimposed more nodular opacities within the right lung base. A chest CT is recommended to better assess for possible pulmonary nodules at this site. Small left pleural effusion, which may be partially loculated. Electronically Signed   By: Kellie Simmering D.O.   On: 01/03/2022 14:50   US Venous Img Lower Bilateral  Result Date: 01/03/2022 CLINICAL DATA:  Bilateral open wounds. EXAM: BILATERAL LOWER EXTREMITY VENOUS DOPPLER ULTRASOUND TECHNIQUE: Gray-scale sonography with graded compression, as well as color Doppler and duplex ultrasound were performed to evaluate the lower extremity deep venous systems from the level of the common femoral vein and including the common femoral, femoral, profunda femoral, popliteal and calf veins including the posterior tibial, peroneal and gastrocnemius veins when visible. The superficial great saphenous vein was also interrogated. Spectral Doppler was utilized to evaluate flow at rest and with distal augmentation maneuvers in the common femoral, femoral and popliteal veins.  COMPARISON:  None Available. FINDINGS: RIGHT LOWER EXTREMITY Common Femoral Vein: No evidence of thrombus. Normal compressibility, respiratory phasicity and response to augmentation. Saphenofemoral Junction: No evidence of thrombus. Normal compressibility and flow on color Doppler imaging. Profunda Femoral Vein: No evidence of thrombus. Normal compressibility and flow on color Doppler imaging. Femoral Vein: No evidence of thrombus. Normal compressibility, respiratory phasicity and response to augmentation. Popliteal Vein: No evidence of thrombus. Normal compressibility, respiratory phasicity and response to augmentation. Calf Veins: Limited evaluation. Superficial Great Saphenous Vein: Limited evaluation. Other Findings:  Large amount of subcutaneous edema. LEFT LOWER EXTREMITY Common Femoral Vein: No evidence of thrombus. Normal compressibility, respiratory phasicity and response to augmentation. Saphenofemoral Junction: No evidence of thrombus. Normal compressibility and flow on color Doppler imaging. Profunda Femoral Vein: No evidence of thrombus. Normal compressibility and flow on color Doppler imaging. Femoral Vein: No evidence of thrombus. Normal compressibility, respiratory phasicity and response to augmentation. Popliteal Vein: No evidence of thrombus. Normal compressibility, respiratory phasicity and response to augmentation. Calf Veins: Limited evaluation. Superficial Great Saphenous Vein: No evidence of thrombus. Normal compressibility. Other Findings:  Subcutaneous edema. IMPRESSION: No evidence of deep venous thrombosis in either lower extremity. However, the study has technical limitations due to body habitus and edema. Limited evaluation the bilateral deep calf veins. Electronically Signed   By: Markus Daft M.D.   On: 01/03/2022 13:45    Orson Eva, DO  [facility]  If 7PM-7AM, please contact night-coverage [email or web address] Password [email or web address] 01/22/2022, 4:18 PM   LOS: 8 days

## 2022-01-22 NOTE — Progress Notes (Signed)
TOC CSW contacted [facility] [phone].  CSW is currently awaiting a call back from nurse.  [facility]  Aleyah Balik Tarpley-Carter, MSW, LCSW-A Pronouns:  She/Her/Hers [facility] Clinical Social Worker Direct Number:  [phone] [email or web address] [email or web address]

## 2022-01-22 NOTE — Progress Notes (Signed)
TOC CSW received a call back from RN at [facility].  RN will call to make transfer tomorrow with TOC CSW Marlou Starks.  Annaleise Burger Tarpley-Carter, MSW, LCSW-A Pronouns:  She/Her/Hers [facility] Clinical Social Worker Direct Number:  [phone] [email or web address]

## 2022-01-23 DIAGNOSIS — I5031 Acute diastolic (congestive) heart failure: Secondary | ICD-10-CM | POA: Diagnosis not present

## 2022-01-23 DIAGNOSIS — N179 Acute kidney failure, unspecified: Secondary | ICD-10-CM | POA: Diagnosis not present

## 2022-01-23 DIAGNOSIS — Z515 Encounter for palliative care: Secondary | ICD-10-CM | POA: Diagnosis not present

## 2022-01-23 DIAGNOSIS — J9601 Acute respiratory failure with hypoxia: Secondary | ICD-10-CM | POA: Diagnosis not present

## 2022-01-23 DIAGNOSIS — Z7189 Other specified counseling: Secondary | ICD-10-CM | POA: Diagnosis not present

## 2022-01-23 DIAGNOSIS — J690 Pneumonitis due to inhalation of food and vomit: Secondary | ICD-10-CM | POA: Diagnosis not present

## 2022-01-23 DIAGNOSIS — G9341 Metabolic encephalopathy: Secondary | ICD-10-CM | POA: Diagnosis not present

## 2022-01-23 NOTE — TOC Progression Note (Signed)
Transition of Care [facility]) - Progression Note    Patient Details  Name: Veronica Horton MRN: [id number] Date of Birth: [birth year]  Transition of Care [facility]) CM/SW Contact  Shade Flood, LCSW Phone Number: 01/23/2022, 2:07 PM  Clinical Narrative:     TOC following. Pt is on comfort measures per MD. Julienne Kass TOC sent referral to [facility]. This LCSW spoke with Tammy at Hospice who states that they are reviewing pt for residential hospice eligibility but that they do not have an RN available to come and assess pt today. Tammy states that they are trying to arrange for the assessment tomorrow. Currently there are no available beds at the [facility] so if pt is found to be appropriate, she would be admitted as GIP.   Will update MD and follow up tomorrow.  Expected Discharge Plan: [place] Barriers to Discharge: Continued Medical Work up  Expected Discharge Plan and Services Expected Discharge Plan: [place] In-house Referral: Clinical Social Work   Post Acute Care Choice: [place] Living arrangements for the past 2 months: [place] Determinants of Health (SDOH) Interventions    Readmission Risk Interventions    08/09/2021    2:37 PM 04/26/2020   10:28 AM  Readmission Risk Prevention Plan  Transportation Screening Complete Complete  Medication Review (RN Care Manager) Complete Complete  PCP or Specialist appointment within 3-5 days of discharge  Complete  HRI or [place] Complete Complete  SW Recovery Care/Counseling Consult Complete Complete  Palliative Care Screening Not Applicable Not [place] Not Applicable Patient Refused

## 2022-01-23 NOTE — Progress Notes (Signed)
Palliative: Ms. Douglas, [place], is resting quietly in bed.  She has been transitioned to full comfort care and appears comfortable.  She denies issues or concerns at this time.  There is no family at bedside at this time.  We talk about symptom management.  I encouraged her to work with nursing staff for needs.  She states understanding.  Conference with attending, bedside nursing staff, transition of care team related to patient condition, needs, goals of care, disposition.  Plan: Full comfort care.  Symptom management medications in place. DNR/goldenrod form completed and placed on chart.  46 minutes Quinn Axe, NP Palliative medicine team Team phone [phone] Greater than 50% of this time was spent counseling and coordinating care related to the above assessment and plan.

## 2022-01-23 NOTE — Progress Notes (Signed)
PROGRESS NOTE  Veronica Horton VQQ:[id number] DOB: Feb 21, 1961 DOA: 01/14/2022 PCP: [facility]  Brief History:  61 year old female with a history of COPD, tobacco abuse, chronic respiratory failure with tracheostomy on 6-8 L at home, laryngeal cancer status post tracheostomy, seizure disorder, GERD, hypertension, orthostatic hypotension presenting with generalized weakness and increasing left lower extremity redness and pain.  The patient visited the emergency department at [facility] on 12/27/2026 with lower extremity edema and pain.  The patient was discharged home from the ED with clindamycin with which she had been compliant.  She continued to have left lower extremity pain and edema.  She stated that she had her left leg against the wall approximately 1 week prior to this admission.  She denies any other recent injuries or falls.  She uses a walker at baseline.  She continues to smoke 1/2 pack/day Although the patient did not formally leave [place], she aggressively advocated for discharge on 01/09/2022. On 01/10/2022, the patient slid out of bed and went to the ED at [facility].  She refused any blood work and was subsequently discharged home. She presented again on 01/14/2022 with shortness of breath with nausea and vomiting.  Chest x-ray showed increased interstitial markings with moderate left pleural effusion and small right pleural effusion.  The patient was started on Lasix.  She was also started on vancomycin and cefepime for pneumonia.  Since admission to the hospital, the patient's condition has waxed and waned without much improvement.  Her mentation also has been waxing and waning.  Palliative medicine was consulted.  Pulmonary medicine was consulted.  She was started antibiotics and lasix.  Although she remained afebrile and hemodynamically stable, her overall medical condition continued to deteriorate with decline in mental status and refusing po  intake.  [place] discussions were held with sister and boyfriend.  They abdicated further decision making to patient's son.  [name] was contacted and favors a more comfort focus approach.    Assessment and Plan:  Acute on chronic respiratory failure with hypoxia and hypercarbia -Multifactorial including aspiration pneumonitis, COPD, fluid overload -The patient is noncompliant with her prescribed diet -Currently stable on 8 L -She is chronically on 8 L at home -after [place] discussion with patient and son>>goal now is to focus on comfort measure hopeful for transition to residential hospice   Aspiration pneumonitis ([place]) CT chest on 01/04/2022 showed Multifocal pneumonia with acute on chronic sequela of aspiration 01/16/22 Speech therapy eval>>dys 3 with thin -MRSA+ -on vanc/cefepime--had 6 days -stopped cefepime due to altered mental status --after [place] discussion with patient and son>>goal now is to focus on comfort measure hopeful for transition to residential hospice   Acute on chronic diastolic CHF (congestive heart failure) ([place]) Clinically fluid overloaded initially with pleural effusions and interstitial edema transition to IV lasix as pt intermittently refuses pills>>d/c 9/22 up to uptrending creatinine and euvolemia -01/08/22 echo--EF 65 to 70%, no WMA. Normal RV; trivial TR -after [place] discussion with patient and son>>goal now is to focus on comfort measure hopeful for transition to residential hospice   Hypokalemia -replete -mag 1.9   Acute renal failure superimposed on stage 3b chronic kidney disease ([place]) Baseline creatinine 1.6-1.8 Presented with serum creatinine 2.44 Now fluid overloaded>>started IV lasix initially>>stop on 9/22 -after [place] discussion with patient and son>>goal now is to focus on comfort measure hopeful for transition to residential hospice   Acute metabolic Encephalopathy -remains confused  intermittently but not agitated -B12 1968 -folate 31.4 -TSH 16.36,  Free T4 = 1.12 -ammonia <10 -VBG 7.48/37/76/27 -obtain UA--no pyuria -order CT brain--No significant interval change in the size of the right parietal mass with associated mass effect and edema -MR brain--3.7 cm right parietal convexity meningioma with associated vasogenic edema, similar to prior; no other acute findings   Laryngeal cancer [facility]) - Patient has expressed not interested in further evaluation or treatment for the cancer -Palliative has seen her in the outpatient setting and planning to establish care more consistently -She is DNR. -after [place] discussion with patient and son>>goal now is to focus on comfort measure hopeful for transition to residential hospice   Tracheostomy status ([place]) CHRONIC RESPIRATORY FAILURE WITH HYPOXIA - ON 6-8 L AT [place] in place; adequate saturation -stable   Tobacco abuse Tobacco cessation discussed   Class 3 obesity (HCC) -Lifestyle modification -Body mass index is 41.79 kg/m.    Hypertension -Stable overall -started IV lopressor--pt refusing po meds   Goals of Care Met with son at bedside and discussed GOC -we discussed patient's overall declining medical condition despite optimal therapy.  We discussed that patient is worse than just a few weeks ago.  We discussed her overall poor prognosis in the setting of her poor baseline function, numerous significant comorbidities and overall FTT and declining health condition.  We discussed her trajectory for recurrent and frequent hospitalizations.  He stated that patient wound not have wanted to live such a poor quality of life. He agrees with DNR and wishes to pursue full comfort measure approach. Plan to consult TOC for transition to residential hospice.  D/c all labs and radiographic studies       Family Communication:  son updated 9/23   Consultants:  palliative, pulm   Code Status:  DNR   DVT Prophylaxis: apixaban     Procedures: As Listed in Progress Note Above    Antibiotics: None Cefepime 9/16>>9/22 Vanc 9/17>>9/22           Subjective: Patient denies fevers, chills, headache, chest pain, dyspnea, nausea, vomiting, diarrhea, abdominal pain, dysuria, hematuria, hematochezia, and melena.   Objective: Vitals:   01/23/22 0333 01/23/22 0803 01/23/22 1229 01/23/22 1430  BP: (!) 105/57  (!) 108/53   Pulse: 80  90   Resp: 16  20   Temp: (!) 97.2 F (36.2 C)  98.1 F (36.7 C)   TempSrc:   Oral   SpO2: 100% 90% 91% 95%  Weight:      Height:        Intake/Output Summary (Last 24 hours) at 01/23/2022 1753 Last data filed at 01/23/2022 0254 Gross per 24 hour  Intake 240 ml  Output 250 ml  Net -10 ml   Weight change:  Exam:  General:  Pt is alert, follows commands appropriately, not in acute distress HEENT: No icterus, No thrush, No neck mass, [place]/AT Cardiovascular: RRR, S1/S2, no rubs, no gallops Respiratory: scattered rhonchi Abdomen: Soft/+BS, non tender, non distended, no guarding Extremities: 1 + LE edema, Right lower posterior thigh hematoma   Data Reviewed: I have personally reviewed following labs and imaging studies Basic Metabolic Panel: Recent Labs  Lab 01/18/22 0444 01/18/22 2233 01/19/22 0655 01/20/22 0318 01/21/22 0426 01/22/22 0505  NA 142 143 141  --  146* 141  K 3.2* 3.0* 3.1*  --  3.2* 2.9*  CL 105 104 107  --  108 104  CO2 $Re'28 26 24  'Awp$ --  26 26  GLUCOSE  84 76 85  --  75 83  BUN 23 24* 22  --  28* 31*  CREATININE 1.97* 2.10* 2.05* 2.48* 3.07* 3.39*  CALCIUM 8.7* 8.8* 8.6*  --  8.9 9.0  MG  --  2.0 1.9  --  2.0  --    Liver Function Tests: Recent Labs  Lab 01/21/22 0426  AST 16  ALT 12  ALKPHOS 104  BILITOT 1.0  PROT 6.5  ALBUMIN 2.5*   No results for input(s): "LIPASE", "AMYLASE" in the last 168 hours. Recent Labs  Lab 01/19/22 0655  AMMONIA <10   Coagulation Profile: No results for input(s): "INR", "PROTIME" in the last 168 hours. CBC: Recent Labs  Lab 01/18/22 0444 01/19/22 0655  01/21/22 0426 01/22/22 0505  WBC 5.5 6.1 5.2 6.4  HGB 9.5* 9.0* 9.8* 10.0*  HCT 31.9* 29.2* 32.6* 33.1*  MCV 95.8 94.8 96.2 95.1  PLT 325 359 336 324   Cardiac Enzymes: No results for input(s): "CKTOTAL", "CKMB", "CKMBINDEX", "TROPONINI" in the last 168 hours. BNP: Invalid input(s): "POCBNP" CBG: Recent Labs  Lab 01/20/22 1557 01/21/22 2154  GLUCAP 77 106*   HbA1C: No results for input(s): "HGBA1C" in the last 72 hours. Urine analysis:    Component Value Date/Time   COLORURINE STRAW (A) 01/19/2022 1800   APPEARANCEUR HAZY (A) 01/19/2022 1800   LABSPEC 1.009 01/19/2022 1800   PHURINE 5.0 01/19/2022 1800   GLUCOSEU NEGATIVE 01/19/2022 1800   HGBUR MODERATE (A) 01/19/2022 1800   BILIRUBINUR NEGATIVE 01/19/2022 1800   KETONESUR 5 (A) 01/19/2022 1800   PROTEINUR NEGATIVE 01/19/2022 1800   UROBILINOGEN 0.2 09/25/2010 0530   NITRITE NEGATIVE 01/19/2022 1800   LEUKOCYTESUR NEGATIVE 01/19/2022 1800   Sepsis Labs: $RemoveBefo'@[id number]'[id number]$ (procalcitonin:4,lacticidven:4) ) Recent Results (from the past 240 hour(s))  Resp Panel by RT-PCR (Flu A&B, Covid) Anterior Nasal Swab     Status: None   Collection Time: 01/14/22  9:03 PM   Specimen: Anterior Nasal Swab  Result Value Ref Range Status   SARS Coronavirus 2 by RT PCR NEGATIVE NEGATIVE Final    Comment: (NOTE) SARS-CoV-2 target nucleic acids are NOT DETECTED.  The SARS-CoV-2 RNA is generally detectable in upper respiratory specimens during the acute phase of infection. The lowest concentration of SARS-CoV-2 viral copies this assay can detect is 138 copies/mL. A negative result does not preclude SARS-Cov-2 infection and should not be used as the sole basis for treatment or other patient management decisions. A negative result may occur with  improper specimen collection/handling, submission of specimen other than nasopharyngeal swab, presence of viral mutation(s) within the areas targeted by this assay, and inadequate number of  viral copies(<138 copies/mL). A negative result must be combined with clinical observations, patient history, and epidemiological information. The expected result is Negative.  Fact Sheet for Patients:  EntrepreneurPulse.com.au  Fact Sheet for Healthcare Providers:  IncredibleEmployment.be  This test is no t yet approved or cleared by the [place] FDA and  has been authorized for detection and/or diagnosis of SARS-CoV-2 by FDA under an Emergency Use Authorization (EUA). This EUA will remain  in effect (meaning this test can be used) for the duration of the COVID-19 declaration under Section 564(b)(1) of the Act, 21 U.S.C.section 360bbb-3(b)(1), unless the authorization is terminated  or revoked sooner.       Influenza A by PCR NEGATIVE NEGATIVE Final   Influenza B by PCR NEGATIVE NEGATIVE Final    Comment: (NOTE) The Xpert Xpress SARS-CoV-2/FLU/RSV plus assay is intended as an aid in  the diagnosis of influenza from Nasopharyngeal swab specimens and should not be used as a sole basis for treatment. Nasal washings and aspirates are unacceptable for Xpert Xpress SARS-CoV-2/FLU/RSV testing.  Fact Sheet for Patients: BloggerCourse.com  Fact Sheet for Healthcare Providers: SeriousBroker.it  This test is not yet approved or cleared by the [place] FDA and has been authorized for detection and/or diagnosis of SARS-CoV-2 by FDA under an Emergency Use Authorization (EUA). This EUA will remain in effect (meaning this test can be used) for the duration of the COVID-19 declaration under Section 564(b)(1) of the Act, 21 U.S.C. section 360bbb-3(b)(1), unless the authorization is terminated or revoked.  Performed at [facility], [address], [place], [place] [address]   Culture, blood (routine x 2) Call MD if unable to obtain prior to antibiotics being given     Status: None   Collection  Time: 01/14/22 10:13 PM   Specimen: BLOOD RIGHT HAND  Result Value Ref Range Status   Specimen Description BLOOD RIGHT HAND BOTTLES DRAWN AEROBIC ONLY  Final   Special Requests   Final    Blood Culture results may not be optimal due to an inadequate volume of blood received in culture bottles   Culture   Final    NO GROWTH 5 DAYS Performed at [facility], [address] [place], [place] [address]    Report Status 01/19/2022 FINAL  Final  Culture, blood (routine x 2) Call MD if unable to obtain prior to antibiotics being given     Status: None   Collection Time: 01/14/22 10:22 PM   Specimen: Left Antecubital; Blood  Result Value Ref Range Status   Specimen Description   Final    LEFT ANTECUBITAL BOTTLES DRAWN AEROBIC AND ANAEROBIC   Special Requests Blood Culture adequate volume  Final   Culture   Final    NO GROWTH 5 DAYS Performed at [facility], [address], [place], [place] [address]    Report Status 01/19/2022 FINAL  Final  MRSA Next Gen by PCR, Nasal     Status: Abnormal   Collection Time: 01/14/22 11:19 PM   Specimen: Nasal Mucosa; Nasal Swab  Result Value Ref Range Status   MRSA by PCR Next Gen DETECTED (A) NOT DETECTED Final    Comment: RESULT CALLED TO, READ BACK BY AND VERIFIED WITH: WHITE,D @ 0102 ON 01/15/22 BY JUW Performed at [facility], [address] [place], [place] [address]   Culture, Respiratory w Gram Stain     Status: None   Collection Time: 01/17/22 11:45 AM   Specimen: Tracheal Aspirate; Respiratory  Result Value Ref Range Status   Specimen Description   Final    TRACHEAL ASPIRATE Performed at [facility], [address], [place], [place] [address]    Special Requests   Final    NONE Performed at [facility], [address], [name], [place] [address]    Gram Stain   Final    WBC PRESENT, PREDOMINANTLY MONONUCLEAR NO ORGANISMS SEEN Performed at [facility], [address], [place], [place] [address]    Culture FEW CANDIDA TROPICALIS   Final   Report Status 01/19/2022 FINAL  Final  Urine Culture     Status: None   Collection Time: 01/19/22  6:00 PM   Specimen: Urine, Clean Catch  Result Value Ref Range Status   Specimen Description   Final    URINE, CLEAN CATCH Performed at [facility], [address], [place], [place] [address]  Special Requests   Final    NONE Performed at [facility], [address] [place], [place] [address]    Culture   Final    NO GROWTH Performed at [facility], [place] [address] [place],  [address]    Report Status 01/20/2022 FINAL  Final     Scheduled Meds:  ipratropium-albuterol  3 mL Nebulization TID   levothyroxine  75 mcg Intravenous Daily   nystatin   Topical BID   Continuous Infusions:  levETIRAcetam 500 mg (01/23/22 1023)    Procedures/Studies: MR BRAIN WO CONTRAST  Result Date: 01/20/2022 CLINICAL DATA:  Initial evaluation for mental status change. EXAM: MRI HEAD WITHOUT CONTRAST TECHNIQUE: Multiplanar, multiecho pulse sequences of the brain and surrounding structures were obtained without intravenous contrast. COMPARISON:  Prior CT from 01/19/2022. FINDINGS: Brain: Examination severely degraded by motion artifact, and is nearly nondiagnostic. Cerebral volume within normal limits. No visible foci of restricted diffusion to suggest acute or subacute ischemia. No visible acute or chronic intracranial blood products. Previously identified meningioma centered at the right parietal convexity again seen, measuring up to approximately 3.7 cm in size. Associated regional mass effect with vasogenic edema, similar to prior CT. No other visible mass lesion or mass effect. No significant midline shift. Ventricles grossly within normal limits for size without hydrocephalus. No extra-axial fluid collection. Pituitary gland suprasellar region grossly within normal limits. Vascular: Major intracranial vascular flow voids are grossly maintained at the skull base, although poorly  assessed due to significant motion. Skull and upper cervical spine: Craniocervical junction within normal limits. Bone marrow signal intensity grossly normal. No scalp soft tissue abnormality. Sinuses/Orbits: Globes and orbital soft tissues grossly within normal limits. Paranasal sinuses are largely clear. No obvious significant mastoid effusion. Other: None. IMPRESSION: 1. Severely motion degraded and nearly nondiagnostic examination. 2. 3.7 cm right parietal convexity meningioma with associated vasogenic edema, similar to prior. 3. No other definite acute intracranial abnormality. Electronically Signed   By: Jeannine Boga M.D.   On: 01/20/2022 23:02   CT HEAD WO CONTRAST (5MM)  Result Date: 01/19/2022 CLINICAL DATA:  Altered mental status. EXAM: CT HEAD WITHOUT CONTRAST TECHNIQUE: Contiguous axial images were obtained from the base of the skull through the vertex without intravenous contrast. RADIATION DOSE REDUCTION: This exam was performed according to the departmental dose-optimization program which includes automated exposure control, adjustment of the mA and/or kV according to patient size and/or use of iterative reconstruction technique. COMPARISON:  Head CT dated 11/28/2020. FINDINGS: Evaluation of this exam is limited due to motion artifact. 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 This is relatively unchanged compared to prior CT. There is associated mass effect and edema in the right parietal lobe also similar to prior CT. There is no acute intracranial hemorrhage. No midline shift. No extra-axial fluid collection. Vascular: No hyperdense vessel or unexpected calcification. Skull: Normal. Negative for fracture or focal lesion. Sinuses/Orbits: No acute finding. Other: None IMPRESSION: 1. No acute intracranial hemorrhage. 2. No significant interval change in the size of the  right parietal mass with associated mass effect and edema. Electronically Signed   By: Anner Crete M.D.   On: 01/19/2022 18:31   DG CHEST PORT 1 VIEW  Result Date: 01/18/2022 CLINICAL DATA:  [id number].  Shortness of breath. EXAM: PORTABLE CHEST 1 VIEW COMPARISON:  Portable 01/16/2022 at 8:37 a.m. FINDINGS: 4:56 a.m. An oxygen mask  superimposes over the left apical area. There is a tracheostomy cannula with the tip 5.4 cm from the carina. There is a low inspiration. There is patchy consolidation, atelectasis or combination in the lower lung fields overlying small pleural effusions. The upper lung fields remain clear. No new or worsened opacity is seen. There is mild cardiomegaly without CHF. Stable mediastinum with mild aortic atherosclerosis. Degenerative change thoracic spine and asymmetrically of the right shoulder. IMPRESSION: There previously was interstitial edema which is not seen today. Bibasilar opacities, atelectasis and/or pneumonia, and small underlying pleural effusions appear similar. Overall aeration is unchanged. Electronically Signed   By: Telford Nab M.D.   On: 01/18/2022 06:02   DG Chest Port 1 View  Result Date: 01/16/2022 CLINICAL DATA:  Respiratory distress EXAM: PORTABLE CHEST 1 VIEW COMPARISON:  Chest radiograph 01/15/2019 FINDINGS: Increased bilateral pleural effusions, left-greater-than-right. Right basilar linear and left basilar consolidative opacities may represent atelectasis, but superimposed infection is difficult to exclude. Increased interstitial opacities and peribronchial wall thickening are suggestive of mild pulmonary edema. There is a tracheostomy in place. Visualized upper abdomen is unremarkable. No acute osseous abnormality. IMPRESSION: New mild pulmonary edema with moderate left and small right pleural effusions. Bibasilar opacities may represent atelectasis in the setting of worsening pleural effusions, but a component of superimposed infection is difficult to  exclude. Electronically Signed   By: Marin Roberts M.D.   On: 01/16/2022 09:01   DG Chest Port 1 View  Result Date: 01/14/2022 CLINICAL DATA:  sob EXAM: PORTABLE CHEST 1 VIEW.  Patient is rotated. COMPARISON:  Chest x-ray 01/03/2022, CT chest 01/04/2022 FINDINGS: Tracheostomy tube with tip terminating 8.5 cm above the carina. The heart and mediastinal contours are grossly unchanged. Low lung volumes persistent retrocardiac airspace opacity. No pulmonary edema. Similar-appearing blunting of bilateral costophrenic angles with no significant pleural effusion. No pneumothorax. No acute osseous abnormality. IMPRESSION: 1. Low lung volumes, persistent retrocardiac airspace opacity with limited evaluation due to patient rotation. 2. Similar-appearing blunting of bilateral costophrenic angles with no significant pleural effusion. Electronically Signed   By: Iven Finn M.D.   On: 01/14/2022 19:49   ECHOCARDIOGRAM COMPLETE  Result Date: 01/07/2022    ECHOCARDIOGRAM REPORT   Patient Name:   Veronica Horton Date of Exam: 01/07/2022 Medical Rec #:  [id number]       Height:       65.5 in Accession #:    [id number]      Weight:       255.0 lb Date of Birth:  06/23/1960       BSA:          2.206 m Patient Age:    69 years        BP:           99/48 mmHg Patient Gender: F               HR:           72 bpm. Exam Location:  [facility] Procedure: 2D Echo, Color Doppler, Cardiac Doppler and Intracardiac            Opacification Agent Indications:    R44.31 Acute diastolic (congestive) heart failure  History:        Patient has prior history of Echocardiogram examinations, most                 recent 04/19/2021. CHF, Arrythmias:Atrial Fibrillation; Risk  Factors:Hypertension, Diabetes, Dyslipidemia and Current Smoker.  Sonographer:    Raquel Sarna Senior RDCS Referring Phys: [phone] Kelle Ruppert  Sonographer Comments: Technically difficult due to patient body habitus. Patient is morbidly obese. IMPRESSIONS  1. Left  ventricular ejection fraction, by estimation, is 65 to 70%. The left ventricle has normal function. The left ventricle has no regional wall motion abnormalities. Left ventricular diastolic parameters were normal.  2. Right ventricular systolic function is normal. The right ventricular size is normal. Tricuspid regurgitation signal is inadequate for assessing PA pressure.  3. The mitral valve is normal in structure. No evidence of mitral valve regurgitation. No evidence of mitral stenosis.  4. The aortic valve was not well visualized. Aortic valve regurgitation is not visualized. No aortic stenosis is present.  5. The inferior vena cava is dilated in size with <50% respiratory variability, suggesting right atrial pressure of 15 mmHg. FINDINGS  Left Ventricle: Left ventricular ejection fraction, by estimation, is 65 to 70%. The left ventricle has normal function. The left ventricle has no regional wall motion abnormalities. Definity contrast agent was given IV to delineate the left ventricular  endocardial borders. The left ventricular internal cavity size was normal in size. There is no left ventricular hypertrophy. Left ventricular diastolic parameters were normal. Right Ventricle: The right ventricular size is normal. Right vetricular wall thickness was not well visualized. Right ventricular systolic function is normal. Tricuspid regurgitation signal is inadequate for assessing PA pressure. Left Atrium: Left atrial size was normal in size. Right Atrium: Right atrial size was normal in size. Pericardium: There is no evidence of pericardial effusion. Mitral Valve: The mitral valve is normal in structure. No evidence of mitral valve regurgitation. No evidence of mitral valve stenosis. Tricuspid Valve: The tricuspid valve is not well visualized. Tricuspid valve regurgitation is trivial. No evidence of tricuspid stenosis. Aortic Valve: The aortic valve was not well visualized. Aortic valve regurgitation is not  visualized. No aortic stenosis is present. Aortic valve mean gradient measures 4.0 mmHg. Aortic valve peak gradient measures 9.3 mmHg. Aortic valve area, by VTI measures 2.33 cm. Pulmonic Valve: The pulmonic valve was not well visualized. Pulmonic valve regurgitation is not visualized. No evidence of pulmonic stenosis. Aorta: The aortic root is normal in size and structure. Venous: The inferior vena cava is dilated in size with less than 50% respiratory variability, suggesting right atrial pressure of 15 mmHg. IAS/Shunts: The interatrial septum was not well visualized.  LEFT VENTRICLE PLAX 2D LVIDd:         4.60 cm LVIDs:         2.50 cm LV PW:         0.80 cm LV IVS:        1.00 cm LVOT diam:     1.80 cm LV SV:         67 LV SV Index:   30 LVOT Area:     2.54 cm  RIGHT VENTRICLE RV S prime:     13.30 cm/s TAPSE (M-mode): 2.0 cm LEFT ATRIUM             Index        RIGHT ATRIUM           Index LA diam:        3.60 cm 1.63 cm/m   RA Area:     20.30 cm LA Vol (A2C):   45.4 ml 20.58 ml/m  RA Volume:   64.40 ml  29.20 ml/m LA Vol (A4C):   63.1 ml  28.61 ml/m LA Biplane Vol: 54.0 ml 24.48 ml/m  AORTIC VALVE AV Area (Vmax):    2.06 cm AV Area (Vmean):   2.15 cm AV Area (VTI):     2.33 cm AV Vmax:           152.23 cm/s AV Vmean:          92.193 cm/s AV VTI:            0.289 m AV Peak Grad:      9.3 mmHg AV Mean Grad:      4.0 mmHg LVOT Vmax:         123.00 cm/s LVOT Vmean:        78.000 cm/s LVOT VTI:          0.264 m LVOT/AV VTI ratio: 0.91  AORTA Ao Root diam: 3.00 cm Ao Asc diam:  2.90 cm  SHUNTS Systemic VTI:  0.26 m Systemic Diam: 1.80 cm Carlyle Dolly MD Electronically signed by Carlyle Dolly MD Signature Date/Time: 01/07/2022/3:23:07 PM    Final    CT CHEST WO CONTRAST  Result Date: 01/04/2022 CLINICAL DATA:  A 61 year old female with chronic dyspnea presents for evaluation in the setting of abnormal chest radiograph. EXAM: CT CHEST WITHOUT CONTRAST TECHNIQUE: Multidetector CT imaging of the chest was  performed following the standard protocol without IV contrast. RADIATION DOSE REDUCTION: This exam was performed according to the departmental dose-optimization program which includes automated exposure control, adjustment of the mA and/or kV according to patient size and/or use of iterative reconstruction technique. COMPARISON:  December of 2021 FINDINGS: Cardiovascular: Scattered aortic atherosclerosis. Three-vessel coronary artery disease. Normal heart size without pericardial effusion or nodularity. Aberrant RIGHT subclavian artery. Mild engorgement of central pulmonary vasculature. Limited assessment of cardiovascular structures given lack of intravenous contrast. Mediastinum/Nodes: Tracheostomy tube terminating in the proximal trachea. No signs of adenopathy in the chest. No acute process in the mediastinum. Esophagus is grossly normal. Lungs/Pleura: Patchy airspace disease in the chest in LEFT upper lobe, lingula, LEFT lower lobe and RIGHT lung base. Volume loss in the LEFT lung base associated also with consolidative changes and material in bronchial structures. No pneumothorax. Trace pleural effusions. Airways are otherwise patent. Upper Abdomen: Incidental imaging of upper abdominal contents without acute process to the extent evaluated. Musculoskeletal: Spinal degenerative changes. No acute or destructive bone process. Rib fractures of varying ages, no acute fractures most are chronic or subacute and without displacement. Fracture at the T9 level with 20-30% loss of height. Vacuum phenomenon within the fracture site. No surrounding stranding. T11 with loss of height as well. These have occurred since December of 2021. No listhesis. IMPRESSION: 1. Multifocal pneumonia with acute on chronic sequela of aspiration favored at the LEFT lung base. 2. Age-indeterminate compression fractures potentially subacute/recent at the T9 level and T11 level are new since 2021. Correlate with any history of prior fracture  to these areas and with any current symptoms. 3. Aortic atherosclerosis and coronary artery disease. Aortic Atherosclerosis (ICD10-I70.0). Electronically Signed   By: Zetta Bills M.D.   On: 01/04/2022 17:23   DG Chest Portable 1 View  Result Date: 01/03/2022 CLINICAL DATA:  Provided history: Shortness of breath. EXAM: PORTABLE CHEST 1 VIEW COMPARISON:  Prior chest radiographs 08/08/2021 and earlier. FINDINGS: A tracheostomy tube is noted. Cardiomegaly. Central pulmonary vascular congestion. Prominence of the interstitial lung markings bilaterally, compatible with pulmonary interstitial edema. Irregular and nodular opacities within the right lung base. The nodular opacities measure up to 16 mm. Irregular  opacities within the left lung base. No evidence of pneumothorax. No acute bony abnormality identified. IMPRESSION: Cardiomegaly with pulmonary interstitial edema. Opacities within bilateral lung bases, which may reflect atelectasis and/or pneumonia. Superimposed more nodular opacities within the right lung base. A chest CT is recommended to better assess for possible pulmonary nodules at this site. Small left pleural effusion, which may be partially loculated. Electronically Signed   By: Kellie Simmering D.O.   On: 01/03/2022 14:50   US Venous Img Lower Bilateral  Result Date: 01/03/2022 CLINICAL DATA:  Bilateral open wounds. EXAM: BILATERAL LOWER EXTREMITY VENOUS DOPPLER ULTRASOUND TECHNIQUE: Gray-scale sonography with graded compression, as well as color Doppler and duplex ultrasound were performed to evaluate the lower extremity deep venous systems from the level of the common femoral vein and including the common femoral, femoral, profunda femoral, popliteal and calf veins including the posterior tibial, peroneal and gastrocnemius veins when visible. The superficial great saphenous vein was also interrogated. Spectral Doppler was utilized to evaluate flow at rest and with distal augmentation maneuvers in  the common femoral, femoral and popliteal veins. COMPARISON:  None Available. FINDINGS: RIGHT LOWER EXTREMITY Common Femoral Vein: No evidence of thrombus. Normal compressibility, respiratory phasicity and response to augmentation. Saphenofemoral Junction: No evidence of thrombus. Normal compressibility and flow on color Doppler imaging. Profunda Femoral Vein: No evidence of thrombus. Normal compressibility and flow on color Doppler imaging. Femoral Vein: No evidence of thrombus. Normal compressibility, respiratory phasicity and response to augmentation. Popliteal Vein: No evidence of thrombus. Normal compressibility, respiratory phasicity and response to augmentation. Calf Veins: Limited evaluation. Superficial Great Saphenous Vein: Limited evaluation. Other Findings:  Large amount of subcutaneous edema. LEFT LOWER EXTREMITY Common Femoral Vein: No evidence of thrombus. Normal compressibility, respiratory phasicity and response to augmentation. Saphenofemoral Junction: No evidence of thrombus. Normal compressibility and flow on color Doppler imaging. Profunda Femoral Vein: No evidence of thrombus. Normal compressibility and flow on color Doppler imaging. Femoral Vein: No evidence of thrombus. Normal compressibility, respiratory phasicity and response to augmentation. Popliteal Vein: No evidence of thrombus. Normal compressibility, respiratory phasicity and response to augmentation. Calf Veins: Limited evaluation. Superficial Great Saphenous Vein: No evidence of thrombus. Normal compressibility. Other Findings:  Subcutaneous edema. IMPRESSION: No evidence of deep venous thrombosis in either lower extremity. However, the study has technical limitations due to body habitus and edema. Limited evaluation the bilateral deep calf veins. Electronically Signed   By: Markus Daft M.D.   On: 01/03/2022 13:45    Orson Eva, DO  [facility]  If 7PM-7AM, please contact night-coverage [email or web address] Password  [facility] 01/23/2022, 5:53 PM   LOS: 9 days

## 2022-01-24 DIAGNOSIS — J9601 Acute respiratory failure with hypoxia: Secondary | ICD-10-CM | POA: Diagnosis not present

## 2022-01-24 DIAGNOSIS — N179 Acute kidney failure, unspecified: Secondary | ICD-10-CM | POA: Diagnosis not present

## 2022-01-24 DIAGNOSIS — G9341 Metabolic encephalopathy: Secondary | ICD-10-CM | POA: Diagnosis not present

## 2022-01-24 DIAGNOSIS — J690 Pneumonitis due to inhalation of food and vomit: Secondary | ICD-10-CM | POA: Diagnosis not present

## 2022-01-24 NOTE — Progress Notes (Signed)
PROGRESS NOTE  Veronica Horton BXU:[id number] DOB: [birth year] DOA: 01/14/2022 PCP: [facility]  Brief History:  60 year old female with a history of COPD, tobacco abuse, chronic respiratory failure with tracheostomy on 6-8 L at home, laryngeal cancer status post tracheostomy, seizure disorder, GERD, hypertension, orthostatic hypotension presenting with generalized weakness and increasing left lower extremity redness and pain.  The patient visited the emergency department at [facility] on 12/27/2026 with lower extremity edema and pain.  The patient was discharged home from the ED with clindamycin with which she had been compliant.  She continued to have left lower extremity pain and edema.  She stated that she had her left leg against the wall approximately 1 week prior to this admission.  She denies any other recent injuries or falls.  She uses a walker at baseline.  She continues to smoke 1/2 pack/day Although the patient did not formally leave [place], she aggressively advocated for discharge on 01/09/2022. On 01/10/2022, the patient slid out of bed and went to the ED at [facility].  She refused any blood work and was subsequently discharged home. She presented again on 01/14/2022 with shortness of breath with nausea and vomiting.  Chest x-ray showed increased interstitial markings with moderate left pleural effusion and small right pleural effusion.  The patient was started on Lasix.  She was also started on vancomycin and cefepime for pneumonia.  Since admission to the hospital, the patient's condition has waxed and waned without much improvement.  Her mentation also has been waxing and waning.  Palliative medicine was consulted.  Pulmonary medicine was consulted.  She was started antibiotics and lasix.  Although she remained afebrile and hemodynamically stable, her overall medical condition continued to deteriorate with decline in mental status and refusing po  intake.  [place] discussions were held with sister and boyfriend.  They abdicated further decision making to patient's son.  [name] was contacted and favors a more comfort focus approach.    Assessment and Plan: Acute on chronic respiratory failure with hypoxia and hypercarbia -Multifactorial including aspiration pneumonitis, COPD, fluid overload -The patient is noncompliant with her prescribed diet -Currently stable on 8 L -She is chronically on 8 L at home -after [place] discussion with patient and son>>goal now is to focus on comfort measure hopeful for transition to residential hospice   Aspiration pneumonitis ([place]) CT chest on 01/04/2022 showed Multifocal pneumonia with acute on chronic sequela of aspiration 01/16/22 Speech therapy eval>>dys 3 with thin -MRSA+ -on vanc/cefepime--had 6 days -stopped cefepime due to altered mental status --after [place] discussion with patient and son>>goal now is to focus on comfort measure hopeful for transition to residential hospice   Acute on chronic diastolic CHF (congestive heart failure) ([place]) Clinically fluid overloaded initially with pleural effusions and interstitial edema transition to IV lasix as pt intermittently refuses pills>>d/c 9/22 up to uptrending creatinine and euvolemia -01/08/22 echo--EF 65 to 70%, no WMA. Normal RV; trivial TR -after GOC discussion with patient and son>>goal now is to focus on comfort measure hopeful for transition to residential hospice   Hypokalemia -replete -mag 1.9   Acute renal failure superimposed on stage 3b chronic kidney disease ([place]) Baseline creatinine 1.6-1.8 Presented with serum creatinine 2.44 Now fluid overloaded>>started IV lasix initially>>stop on 9/22 -after [place] discussion with patient and son>>goal now is to focus on comfort measure hopeful for transition to residential hospice   Acute metabolic Encephalopathy -remains confused intermittently  but not agitated -B12 1968 -folate 31.4 -TSH 16.36,  Free T4 = 1.12 -ammonia <10 -VBG 7.48/37/76/27 -obtain UA--no pyuria -order CT brain--No significant interval change in the size of the right parietal mass with associated mass effect and edema -MR brain--3.7 cm right parietal convexity meningioma with associated vasogenic edema, similar to prior; no other acute findings   Laryngeal cancer [facility]) - Patient has expressed not interested in further evaluation or treatment for the cancer -Palliative has seen her in the outpatient setting and planning to establish care more consistently -She is DNR. -after [place] discussion with patient and son>>goal now is to focus on comfort measure hopeful for transition to residential hospice   Tracheostomy status ([place]) CHRONIC RESPIRATORY FAILURE WITH HYPOXIA - ON 6-8 L AT [place] in place; adequate saturation -stable   Tobacco abuse Tobacco cessation discussed   Class 3 obesity (HCC) -Lifestyle modification -Body mass index is 41.79 kg/m.    Hypertension -Stable overall -started IV lopressor--pt refusing po meds initially -now focus on comfort measures   Goals of Care Met with son at bedside and discussed GOC -we discussed patient's overall declining medical condition despite optimal therapy.  We discussed that patient is worse than just a few weeks ago.  We discussed her overall poor prognosis in the setting of her poor baseline function, numerous significant comorbidities and overall FTT and declining health condition.  We discussed her trajectory for recurrent and frequent hospitalizations.  He stated that patient wound not have wanted to live such a poor quality of life. He agrees with DNR and wishes to pursue full comfort measure approach. Plan to consult TOC for transition to residential hospice.  D/c all labs and radiographic studies  Family Communication:  son updated 9/24   Consultants:  palliative, pulm   Code Status:  DNR   DVT Prophylaxis: apixaban     Procedures: As  Listed in Progress Note Above   Antibiotics: None Cefepime 9/16>>9/22 Vanc 9/17>>9/22          Subjective: Patient denies fevers, chills, headache, chest pain, dyspnea, nausea, vomiting, diarrhea   Objective: Vitals:   01/24/22 1117 01/24/22 1318 01/24/22 1321 01/24/22 1541  BP:   115/66   Pulse:   76   Resp: $Remo'18  18 17  'HcGsA$ Temp:   98 F (36.7 C)   TempSrc:   Oral   SpO2: 99% 98% 100% 100%  Weight:      Height:        Intake/Output Summary (Last 24 hours) at 01/24/2022 1728 Last data filed at 01/24/2022 1300 Gross per 24 hour  Intake 720 ml  Output 700 ml  Net 20 ml   Weight change:  Exam:  General:  Pt is alert, follows commands appropriately, not in acute distress HEENT: No icterus, No thrush, No neck mass, [place]/AT Cardiovascular: RRR, S1/S2, no rubs, no gallops Respiratory: scattered rhonchi. Abdomen: Soft/+BS, non tender, non distended, no guarding Extremities: RLE post thigh with hematoma and erythema   Data Reviewed: I have personally reviewed following labs and imaging studies Basic Metabolic Panel: Recent Labs  Lab 01/18/22 0444 01/18/22 2233 01/19/22 0655 01/20/22 0318 01/21/22 0426 01/22/22 0505  NA 142 143 141  --  146* 141  K 3.2* 3.0* 3.1*  --  3.2* 2.9*  CL 105 104 107  --  108 104  CO2 $Re'28 26 24  'pND$ --  26 26  GLUCOSE 84 76 85  --  75 83  BUN 23 24* 22  --  28* 31*  CREATININE 1.97* 2.10* 2.05* 2.48* 3.07* 3.39*  CALCIUM 8.7* 8.8* 8.6*  --  8.9 9.0  MG  --  2.0 1.9  --  2.0  --    Liver Function Tests: Recent Labs  Lab 01/21/22 0426  AST 16  ALT 12  ALKPHOS 104  BILITOT 1.0  PROT 6.5  ALBUMIN 2.5*   No results for input(s): "LIPASE", "AMYLASE" in the last 168 hours. Recent Labs  Lab 01/19/22 0655  AMMONIA <10   Coagulation Profile: No results for input(s): "INR", "PROTIME" in the last 168 hours. CBC: Recent Labs  Lab 01/18/22 0444 01/19/22 0655 01/21/22 0426 01/22/22 0505  WBC 5.5 6.1 5.2 6.4  HGB 9.5* 9.0* 9.8* 10.0*   HCT 31.9* 29.2* 32.6* 33.1*  MCV 95.8 94.8 96.2 95.1  PLT 325 359 336 324   Cardiac Enzymes: No results for input(s): "CKTOTAL", "CKMB", "CKMBINDEX", "TROPONINI" in the last 168 hours. BNP: Invalid input(s): "POCBNP" CBG: Recent Labs  Lab 01/20/22 1557 01/21/22 2154  GLUCAP 77 106*   HbA1C: No results for input(s): "HGBA1C" in the last 72 hours. Urine analysis:    Component Value Date/Time   COLORURINE STRAW (A) 01/19/2022 1800   APPEARANCEUR HAZY (A) 01/19/2022 1800   LABSPEC 1.009 01/19/2022 1800   PHURINE 5.0 01/19/2022 1800   GLUCOSEU NEGATIVE 01/19/2022 1800   HGBUR MODERATE (A) 01/19/2022 1800   BILIRUBINUR NEGATIVE 01/19/2022 1800   KETONESUR 5 (A) 01/19/2022 1800   PROTEINUR NEGATIVE 01/19/2022 1800   UROBILINOGEN 0.2 09/25/2010 0530   NITRITE NEGATIVE 01/19/2022 1800   LEUKOCYTESUR NEGATIVE 01/19/2022 1800   Sepsis Labs: $RemoveBefo'@[id number]'[id number]$ (procalcitonin:4,lacticidven:4) ) Recent Results (from the past 240 hour(s))  Resp Panel by RT-PCR (Flu A&B, Covid) Anterior Nasal Swab     Status: None   Collection Time: 01/14/22  9:03 PM   Specimen: Anterior Nasal Swab  Result Value Ref Range Status   SARS Coronavirus 2 by RT PCR NEGATIVE NEGATIVE Final    Comment: (NOTE) SARS-CoV-2 target nucleic acids are NOT DETECTED.  The SARS-CoV-2 RNA is generally detectable in upper respiratory specimens during the acute phase of infection. The lowest concentration of SARS-CoV-2 viral copies this assay can detect is 138 copies/mL. A negative result does not preclude SARS-Cov-2 infection and should not be used as the sole basis for treatment or other patient management decisions. A negative result may occur with  improper specimen collection/handling, submission of specimen other than nasopharyngeal swab, presence of viral mutation(s) within the areas targeted by this assay, and inadequate number of viral copies(<138 copies/mL). A negative result must be combined with clinical  observations, patient history, and epidemiological information. The expected result is Negative.  Fact Sheet for Patients:  EntrepreneurPulse.com.au  Fact Sheet for Healthcare Providers:  IncredibleEmployment.be  This test is no t yet approved or cleared by the [place] FDA and  has been authorized for detection and/or diagnosis of SARS-CoV-2 by FDA under an Emergency Use Authorization (EUA). This EUA will remain  in effect (meaning this test can be used) for the duration of the COVID-19 declaration under Section 564(b)(1) of the Act, 21 U.S.C.section 360bbb-3(b)(1), unless the authorization is terminated  or revoked sooner.       Influenza A by PCR NEGATIVE NEGATIVE Final   Influenza B by PCR NEGATIVE NEGATIVE Final    Comment: (NOTE) The Xpert Xpress SARS-CoV-2/FLU/RSV plus assay is intended as an aid in the diagnosis of influenza from Nasopharyngeal swab specimens and should not be used as a sole  basis for treatment. Nasal washings and aspirates are unacceptable for Xpert Xpress SARS-CoV-2/FLU/RSV testing.  Fact Sheet for Patients: EntrepreneurPulse.com.au  Fact Sheet for Healthcare Providers: IncredibleEmployment.be  This test is not yet approved or cleared by the [place] FDA and has been authorized for detection and/or diagnosis of SARS-CoV-2 by FDA under an Emergency Use Authorization (EUA). This EUA will remain in effect (meaning this test can be used) for the duration of the COVID-19 declaration under Section 564(b)(1) of the Act, 21 U.S.C. section 360bbb-3(b)(1), unless the authorization is terminated or revoked.  Performed at [facility], [address] [place], [place] [address]   Culture, blood (routine x 2) Call MD if unable to obtain prior to antibiotics being given     Status: None   Collection Time: 01/14/22 10:13 PM   Specimen: BLOOD RIGHT HAND  Result Value Ref Range  Status   Specimen Description BLOOD RIGHT HAND BOTTLES DRAWN AEROBIC ONLY  Final   Special Requests   Final    Blood Culture results may not be optimal due to an inadequate volume of blood received in culture bottles   Culture   Final    NO GROWTH 5 DAYS Performed at [facility], [address], [place], [place] [address]    Report Status 01/19/2022 FINAL  Final  Culture, blood (routine x 2) Call MD if unable to obtain prior to antibiotics being given     Status: None   Collection Time: 01/14/22 10:22 PM   Specimen: Left Antecubital; Blood  Result Value Ref Range Status   Specimen Description   Final    LEFT ANTECUBITAL BOTTLES DRAWN AEROBIC AND ANAEROBIC   Special Requests Blood Culture adequate volume  Final   Culture   Final    NO GROWTH 5 DAYS Performed at [facility], [address], [place], [place] [address]    Report Status 01/19/2022 FINAL  Final  MRSA Next Gen by PCR, Nasal     Status: Abnormal   Collection Time: 01/14/22 11:19 PM   Specimen: Nasal Mucosa; Nasal Swab  Result Value Ref Range Status   MRSA by PCR Next Gen DETECTED (A) NOT DETECTED Final    Comment: RESULT CALLED TO, READ BACK BY AND VERIFIED WITH: WHITE,D @ 0102 ON 01/15/22 BY JUW Performed at [facility], [address] [place], [place] [address]   Culture, Respiratory w Gram Stain     Status: None   Collection Time: 01/17/22 11:45 AM   Specimen: Tracheal Aspirate; Respiratory  Result Value Ref Range Status   Specimen Description   Final    TRACHEAL ASPIRATE Performed at [facility], [address], [place], [place] [address]    Special Requests   Final    NONE Performed at [facility], [address] [place], [place] [address]    Gram Stain   Final    WBC PRESENT, PREDOMINANTLY MONONUCLEAR NO ORGANISMS SEEN Performed at [facility], [place] [address] [place], [place] [address]    Culture FEW CANDIDA TROPICALIS  Final   Report Status 01/19/2022 FINAL  Final  Urine Culture     Status:  None   Collection Time: 01/19/22  6:00 PM   Specimen: Urine, Clean Catch  Result Value Ref Range Status   Specimen Description   Final    URINE, CLEAN CATCH Performed at [facility], [address], [place],  [address]    Special Requests   Final    NONE Performed at [facility],  [address], [place], [place] [address]    Culture   Final    NO GROWTH Performed at [place] [facility], [place] [address], [place],  [address]    Report Status 01/20/2022 FINAL  Final     Scheduled Meds:  ipratropium-albuterol  3 mL Nebulization TID   levothyroxine  75 mcg Intravenous Daily   nystatin   Topical BID   Continuous Infusions:  levETIRAcetam 500 mg (01/24/22 1009)    Procedures/Studies: MR BRAIN WO CONTRAST  Result Date: 01/20/2022 CLINICAL DATA:  Initial evaluation for mental status change. EXAM: MRI HEAD WITHOUT CONTRAST TECHNIQUE: Multiplanar, multiecho pulse sequences of the brain and surrounding structures were obtained without intravenous contrast. COMPARISON:  Prior CT from 01/19/2022. FINDINGS: Brain: Examination severely degraded by motion artifact, and is nearly nondiagnostic. Cerebral volume within normal limits. No visible foci of restricted diffusion to suggest acute or subacute ischemia. No visible acute or chronic intracranial blood products. Previously identified meningioma centered at the right parietal convexity again seen, measuring up to approximately 3.7 cm in size. Associated regional mass effect with vasogenic edema, similar to prior CT. No other visible mass lesion or mass effect. No significant midline shift. Ventricles grossly within normal limits for size without hydrocephalus. No extra-axial fluid collection. Pituitary gland suprasellar region grossly within normal limits. Vascular: Major intracranial vascular flow voids are grossly maintained at the skull base, although poorly assessed due to significant motion. Skull and upper cervical spine:  Craniocervical junction within normal limits. Bone marrow signal intensity grossly normal. No scalp soft tissue abnormality. Sinuses/Orbits: Globes and orbital soft tissues grossly within normal limits. Paranasal sinuses are largely clear. No obvious significant mastoid effusion. Other: None. IMPRESSION: 1. Severely motion degraded and nearly nondiagnostic examination. 2. 3.7 cm right parietal convexity meningioma with associated vasogenic edema, similar to prior. 3. No other definite acute intracranial abnormality. Electronically Signed   By: Jeannine Boga M.D.   On: 01/20/2022 23:02   CT HEAD WO CONTRAST (5MM)  Result Date: 01/19/2022 CLINICAL DATA:  Altered mental status. EXAM: CT HEAD WITHOUT CONTRAST TECHNIQUE: Contiguous axial images were obtained from the base of the skull through the vertex without intravenous contrast. RADIATION DOSE REDUCTION: This exam was performed according to the departmental dose-optimization program which includes automated exposure control, adjustment of the mA and/or kV according to patient size and/or use of iterative reconstruction technique. COMPARISON:  Head CT dated 11/28/2020. FINDINGS: Evaluation of this exam is limited due to motion artifact. 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 This is relatively unchanged compared to prior CT. There is associated mass effect and edema in the right parietal lobe also similar to prior CT. There is no acute intracranial hemorrhage. No midline shift. No extra-axial fluid collection. Vascular: No hyperdense vessel or unexpected calcification. Skull: Normal. Negative for fracture or focal lesion. Sinuses/Orbits: No acute finding. Other: None IMPRESSION: 1. No acute intracranial hemorrhage. 2. No significant interval change in the size of the right parietal mass with associated mass effect and edema.  Electronically Signed   By: Anner Crete M.D.   On: 01/19/2022 18:31   DG CHEST PORT 1 VIEW  Result Date: 01/18/2022 CLINICAL DATA:  [address].  Shortness of breath. EXAM: PORTABLE CHEST 1 VIEW COMPARISON:  Portable 01/16/2022 at 8:37 a.m. FINDINGS: 4:56 a.m. An oxygen mask superimposes over the left apical area. There is a tracheostomy cannula with the tip  5.4 cm from the carina. There is a low inspiration. There is patchy consolidation, atelectasis or combination in the lower lung fields overlying small pleural effusions. The upper lung fields remain clear. No new or worsened opacity is seen. There is mild cardiomegaly without CHF. Stable mediastinum with mild aortic atherosclerosis. Degenerative change thoracic spine and asymmetrically of the right shoulder. IMPRESSION: There previously was interstitial edema which is not seen today. Bibasilar opacities, atelectasis and/or pneumonia, and small underlying pleural effusions appear similar. Overall aeration is unchanged. Electronically Signed   By: Telford Nab M.D.   On: 01/18/2022 06:02   DG Chest Port 1 View  Result Date: 01/16/2022 CLINICAL DATA:  Respiratory distress EXAM: PORTABLE CHEST 1 VIEW COMPARISON:  Chest radiograph 01/15/2019 FINDINGS: Increased bilateral pleural effusions, left-greater-than-right. Right basilar linear and left basilar consolidative opacities may represent atelectasis, but superimposed infection is difficult to exclude. Increased interstitial opacities and peribronchial wall thickening are suggestive of mild pulmonary edema. There is a tracheostomy in place. Visualized upper abdomen is unremarkable. No acute osseous abnormality. IMPRESSION: New mild pulmonary edema with moderate left and small right pleural effusions. Bibasilar opacities may represent atelectasis in the setting of worsening pleural effusions, but a component of superimposed infection is difficult to exclude. Electronically Signed   By: Marin Roberts M.D.    On: 01/16/2022 09:01   DG Chest Port 1 View  Result Date: 01/14/2022 CLINICAL DATA:  sob EXAM: PORTABLE CHEST 1 VIEW.  Patient is rotated. COMPARISON:  Chest x-ray 01/03/2022, CT chest 01/04/2022 FINDINGS: Tracheostomy tube with tip terminating 8.5 cm above the carina. The heart and mediastinal contours are grossly unchanged. Low lung volumes persistent retrocardiac airspace opacity. No pulmonary edema. Similar-appearing blunting of bilateral costophrenic angles with no significant pleural effusion. No pneumothorax. No acute osseous abnormality. IMPRESSION: 1. Low lung volumes, persistent retrocardiac airspace opacity with limited evaluation due to patient rotation. 2. Similar-appearing blunting of bilateral costophrenic angles with no significant pleural effusion. Electronically Signed   By: Iven Finn M.D.   On: 01/14/2022 19:49   ECHOCARDIOGRAM COMPLETE  Result Date: 01/07/2022    ECHOCARDIOGRAM REPORT   Patient Name:   MARIKA MAHAFFY Date of Exam: 01/07/2022 Medical Rec #:  [id number]       Height:       65.5 in Accession #:    [id number]      Weight:       255.0 lb Date of Birth:  21-Sep-1960       BSA:          2.206 m Patient Age:    70 years        BP:           99/48 mmHg Patient Gender: F               HR:           72 bpm. Exam Location:  [facility] Procedure: 2D Echo, Color Doppler, Cardiac Doppler and Intracardiac            Opacification Agent Indications:    U54.27 Acute diastolic (congestive) heart failure  History:        Patient has prior history of Echocardiogram examinations, most                 recent 04/19/2021. CHF, Arrythmias:Atrial Fibrillation; Risk                 Factors:Hypertension, Diabetes, Dyslipidemia and Current Smoker.  Sonographer:    Raquel Sarna  Senior RDCS Referring Phys: [phone] Zahari Fazzino  Sonographer Comments: Technically difficult due to patient body habitus. Patient is morbidly obese. IMPRESSIONS  1. Left ventricular ejection fraction, by estimation, is 65 to 70%. The left  ventricle has normal function. The left ventricle has no regional wall motion abnormalities. Left ventricular diastolic parameters were normal.  2. Right ventricular systolic function is normal. The right ventricular size is normal. Tricuspid regurgitation signal is inadequate for assessing PA pressure.  3. The mitral valve is normal in structure. No evidence of mitral valve regurgitation. No evidence of mitral stenosis.  4. The aortic valve was not well visualized. Aortic valve regurgitation is not visualized. No aortic stenosis is present.  5. The inferior vena cava is dilated in size with <50% respiratory variability, suggesting right atrial pressure of 15 mmHg. FINDINGS  Left Ventricle: Left ventricular ejection fraction, by estimation, is 65 to 70%. The left ventricle has normal function. The left ventricle has no regional wall motion abnormalities. Definity contrast agent was given IV to delineate the left ventricular  endocardial borders. The left ventricular internal cavity size was normal in size. There is no left ventricular hypertrophy. Left ventricular diastolic parameters were normal. Right Ventricle: The right ventricular size is normal. Right vetricular wall thickness was not well visualized. Right ventricular systolic function is normal. Tricuspid regurgitation signal is inadequate for assessing PA pressure. Left Atrium: Left atrial size was normal in size. Right Atrium: Right atrial size was normal in size. Pericardium: There is no evidence of pericardial effusion. Mitral Valve: The mitral valve is normal in structure. No evidence of mitral valve regurgitation. No evidence of mitral valve stenosis. Tricuspid Valve: The tricuspid valve is not well visualized. Tricuspid valve regurgitation is trivial. No evidence of tricuspid stenosis. Aortic Valve: The aortic valve was not well visualized. Aortic valve regurgitation is not visualized. No aortic stenosis is present. Aortic valve mean gradient measures  4.0 mmHg. Aortic valve peak gradient measures 9.3 mmHg. Aortic valve area, by VTI measures 2.33 cm. Pulmonic Valve: The pulmonic valve was not well visualized. Pulmonic valve regurgitation is not visualized. No evidence of pulmonic stenosis. Aorta: The aortic root is normal in size and structure. Venous: The inferior vena cava is dilated in size with less than 50% respiratory variability, suggesting right atrial pressure of 15 mmHg. IAS/Shunts: The interatrial septum was not well visualized.  LEFT VENTRICLE PLAX 2D LVIDd:         4.60 cm LVIDs:         2.50 cm LV PW:         0.80 cm LV IVS:        1.00 cm LVOT diam:     1.80 cm LV SV:         67 LV SV Index:   30 LVOT Area:     2.54 cm  RIGHT VENTRICLE RV S prime:     13.30 cm/s TAPSE (M-mode): 2.0 cm LEFT ATRIUM             Index        RIGHT ATRIUM           Index LA diam:        3.60 cm 1.63 cm/m   RA Area:     20.30 cm LA Vol (A2C):   45.4 ml 20.58 ml/m  RA Volume:   64.40 ml  29.20 ml/m LA Vol (A4C):   63.1 ml 28.61 ml/m LA Biplane Vol: 54.0 ml 24.48 ml/m  AORTIC VALVE  AV Area (Vmax):    2.06 cm AV Area (Vmean):   2.15 cm AV Area (VTI):     2.33 cm AV Vmax:           152.23 cm/s AV Vmean:          92.193 cm/s AV VTI:            0.289 m AV Peak Grad:      9.3 mmHg AV Mean Grad:      4.0 mmHg LVOT Vmax:         123.00 cm/s LVOT Vmean:        78.000 cm/s LVOT VTI:          0.264 m LVOT/AV VTI ratio: 0.91  AORTA Ao Root diam: 3.00 cm Ao Asc diam:  2.90 cm  SHUNTS Systemic VTI:  0.26 m Systemic Diam: 1.80 cm Carlyle Dolly MD Electronically signed by Carlyle Dolly MD Signature Date/Time: 01/07/2022/3:23:07 PM    Final    CT CHEST WO CONTRAST  Result Date: 01/04/2022 CLINICAL DATA:  A 61 year old female with chronic dyspnea presents for evaluation in the setting of abnormal chest radiograph. EXAM: CT CHEST WITHOUT CONTRAST TECHNIQUE: Multidetector CT imaging of the chest was performed following the standard protocol without IV contrast. RADIATION DOSE  REDUCTION: This exam was performed according to the departmental dose-optimization program which includes automated exposure control, adjustment of the mA and/or kV according to patient size and/or use of iterative reconstruction technique. COMPARISON:  December of 2021 FINDINGS: Cardiovascular: Scattered aortic atherosclerosis. Three-vessel coronary artery disease. Normal heart size without pericardial effusion or nodularity. Aberrant RIGHT subclavian artery. Mild engorgement of central pulmonary vasculature. Limited assessment of cardiovascular structures given lack of intravenous contrast. Mediastinum/Nodes: Tracheostomy tube terminating in the proximal trachea. No signs of adenopathy in the chest. No acute process in the mediastinum. Esophagus is grossly normal. Lungs/Pleura: Patchy airspace disease in the chest in LEFT upper lobe, lingula, LEFT lower lobe and RIGHT lung base. Volume loss in the LEFT lung base associated also with consolidative changes and material in bronchial structures. No pneumothorax. Trace pleural effusions. Airways are otherwise patent. Upper Abdomen: Incidental imaging of upper abdominal contents without acute process to the extent evaluated. Musculoskeletal: Spinal degenerative changes. No acute or destructive bone process. Rib fractures of varying ages, no acute fractures most are chronic or subacute and without displacement. Fracture at the T9 level with 20-30% loss of height. Vacuum phenomenon within the fracture site. No surrounding stranding. T11 with loss of height as well. These have occurred since December of 2021. No listhesis. IMPRESSION: 1. Multifocal pneumonia with acute on chronic sequela of aspiration favored at the LEFT lung base. 2. Age-indeterminate compression fractures potentially subacute/recent at the T9 level and T11 level are new since 2021. Correlate with any history of prior fracture to these areas and with any current symptoms. 3. Aortic atherosclerosis and  coronary artery disease. Aortic Atherosclerosis (ICD10-I70.0). Electronically Signed   By: Zetta Bills M.D.   On: 01/04/2022 17:23   DG Chest Portable 1 View  Result Date: 01/03/2022 CLINICAL DATA:  Provided history: Shortness of breath. EXAM: PORTABLE CHEST 1 VIEW COMPARISON:  Prior chest radiographs 08/08/2021 and earlier. FINDINGS: A tracheostomy tube is noted. Cardiomegaly. Central pulmonary vascular congestion. Prominence of the interstitial lung markings bilaterally, compatible with pulmonary interstitial edema. Irregular and nodular opacities within the right lung base. The nodular opacities measure up to 16 mm. Irregular opacities within the left lung base. No evidence of pneumothorax. No acute  bony abnormality identified. IMPRESSION: Cardiomegaly with pulmonary interstitial edema. Opacities within bilateral lung bases, which may reflect atelectasis and/or pneumonia. Superimposed more nodular opacities within the right lung base. A chest CT is recommended to better assess for possible pulmonary nodules at this site. Small left pleural effusion, which may be partially loculated. Electronically Signed   By: Kellie Simmering D.O.   On: 01/03/2022 14:50   US Venous Img Lower Bilateral  Result Date: 01/03/2022 CLINICAL DATA:  Bilateral open wounds. EXAM: BILATERAL LOWER EXTREMITY VENOUS DOPPLER ULTRASOUND TECHNIQUE: Gray-scale sonography with graded compression, as well as color Doppler and duplex ultrasound were performed to evaluate the lower extremity deep venous systems from the level of the common femoral vein and including the common femoral, femoral, profunda femoral, popliteal and calf veins including the posterior tibial, peroneal and gastrocnemius veins when visible. The superficial great saphenous vein was also interrogated. Spectral Doppler was utilized to evaluate flow at rest and with distal augmentation maneuvers in the common femoral, femoral and popliteal veins. COMPARISON:  None Available.  FINDINGS: RIGHT LOWER EXTREMITY Common Femoral Vein: No evidence of thrombus. Normal compressibility, respiratory phasicity and response to augmentation. Saphenofemoral Junction: No evidence of thrombus. Normal compressibility and flow on color Doppler imaging. Profunda Femoral Vein: No evidence of thrombus. Normal compressibility and flow on color Doppler imaging. Femoral Vein: No evidence of thrombus. Normal compressibility, respiratory phasicity and response to augmentation. Popliteal Vein: No evidence of thrombus. Normal compressibility, respiratory phasicity and response to augmentation. Calf Veins: Limited evaluation. Superficial Great Saphenous Vein: Limited evaluation. Other Findings:  Large amount of subcutaneous edema. LEFT LOWER EXTREMITY Common Femoral Vein: No evidence of thrombus. Normal compressibility, respiratory phasicity and response to augmentation. Saphenofemoral Junction: No evidence of thrombus. Normal compressibility and flow on color Doppler imaging. Profunda Femoral Vein: No evidence of thrombus. Normal compressibility and flow on color Doppler imaging. Femoral Vein: No evidence of thrombus. Normal compressibility, respiratory phasicity and response to augmentation. Popliteal Vein: No evidence of thrombus. Normal compressibility, respiratory phasicity and response to augmentation. Calf Veins: Limited evaluation. Superficial Great Saphenous Vein: No evidence of thrombus. Normal compressibility. Other Findings:  Subcutaneous edema. IMPRESSION: No evidence of deep venous thrombosis in either lower extremity. However, the study has technical limitations due to body habitus and edema. Limited evaluation the bilateral deep calf veins. Electronically Signed   By: Markus Daft M.D.   On: 01/03/2022 13:45    Orson Eva, DO  [facility]  If 7PM-7AM, please contact night-coverage [email or web address] Password [facility] 01/24/2022, 5:28 PM   LOS: 10 days

## 2022-01-24 NOTE — Progress Notes (Signed)
Patient requested dressing be removed from leg and arm. Patient educated on the importance of the dressing. Patient still requested it be removed. Removed dressing and MD Tat made aware.

## 2022-01-24 NOTE — TOC Progression Note (Addendum)
Transition of Care [facility]) - Progression Note    Patient Details  Name: OAKLEIGH HESKETH MRN: [id number] Date of Birth: [birth year]  [facility] CM/SW Contact  Shade Flood, LCSW Phone Number: 01/24/2022, 1:27 PM  Clinical Narrative:     TOC following. [facility] RN has assessed pt at bedside and states pt is not a candidate for residential hospice at this time. Spoke with pt's son and offered referral to [facility] for possible residential hospice at their facility vs hospice care at the [facility] in [place] that previously made a bed offer. [name] agreeable to referral to [facility] for hospice care at whichever setting most appropriate.   Referral made to Sarah at [facility]. Updated Mika at [facility] and awaiting their review. Will follow.  1535: Per Judson Roch at [facility], pt does not meet criteria for residential hospice. She states that she has spoken to son and he is agreeable for [facility] to follow at [facility]. Awaiting return call from [facility] at [facility] still to confirm that they can accept pt.   Expected Discharge Plan: [place] Barriers to Discharge: Continued Medical Work up  Expected Discharge Plan and Services Expected Discharge Plan: [place] In-house Referral: Clinical Social Work   Post Acute Care Choice: [place] Living arrangements for the past 2 months: [place] Determinants of Health (SDOH) Interventions    Readmission Risk Interventions    08/09/2021    2:37 PM 04/26/2020   10:28 AM  Readmission Risk Prevention Plan  Transportation Screening Complete Complete  Medication Review (RN Care Manager) Complete Complete  PCP or Specialist appointment within 3-5 days of discharge  Complete  HRI or [place] Complete Complete  SW Recovery Care/Counseling Consult Complete Complete  Palliative Care Screening Not  Applicable Not [place] Not Applicable Patient Refused

## 2022-01-24 NOTE — Progress Notes (Signed)
[facility]) Hospital Liaison Note   Received request from Transitions of Care Manager, Shade Flood, for hospice services at a facility after discharge.   Spoke with patient's son, Gerald Stabs to initiate education related to hospice philosophy, services, and team approach to care. Gerald Stabs verbalized understanding of information given. Per discussion, the plan is for patient to discharge to a nursing home once placement has been arranged.    Will continue to follow patient in the hospital for discharge disposition.     [facility] information and contact numbers given to family & above information shared with TOC.   Please call with any questions/concerns.    Thank you for the opportunity to participate in this patient's care.   Zigmund Gottron  [facility] Liaison  [phone]

## 2022-01-24 NOTE — Progress Notes (Signed)
Chaplain engaged in an initial visit with Pamala Hurry.  Leeta voiced that she had met Chaplain before at [facility].  Chaplain offered listening as Shaquisha shared about her healthcare journey and desires.  She voiced wanting to have been able to live.  She also expressed wanting to have family and friends more present around her.    During visit, Dayanira called a family member, [name], by phone.  [name] wanted to know about where Adrean was being transferred to hospice and details about the facility's hours.  Chaplain let her know that the Case Worker would know that information.  Family wants to create a schedule around seeing Doniesha but feel unaware of what to do.  Chaplain spoke with [name] about following up with Jeralyn's son.  She understands that he is Management consultant concerning her health.   Chaplain offered listening, support, and presence as Yailin expressed her feelings concerning her health.     01/24/22 1400  Clinical Encounter Type  Visited With Patient  Visit Type Initial;Spiritual support

## 2022-01-25 DIAGNOSIS — G9341 Metabolic encephalopathy: Secondary | ICD-10-CM | POA: Diagnosis not present

## 2022-01-25 DIAGNOSIS — J690 Pneumonitis due to inhalation of food and vomit: Secondary | ICD-10-CM | POA: Diagnosis not present

## 2022-01-25 DIAGNOSIS — T17908D Unspecified foreign body in respiratory tract, part unspecified causing other injury, subsequent encounter: Secondary | ICD-10-CM | POA: Diagnosis not present

## 2022-01-25 DIAGNOSIS — R1310 Dysphagia, unspecified: Secondary | ICD-10-CM | POA: Diagnosis not present

## 2022-01-25 NOTE — Progress Notes (Signed)
Pt has removed inner cannula x 2 tonight, currently resting comfortably, will replace later if pt allows.

## 2022-01-25 NOTE — Progress Notes (Signed)
Palliative: Conference with attending, bedside nursing staff, transition of care team related to patient condition, needs, goals of care, disposition.  Transition of care team is working diligently for disposition.  No needs at this time.  No charge Quinn Axe, NP Palliative medicine team Team phone [phone] Greater than 50% of this time was spent counseling and coordinating care related to the above assessment and plan.

## 2022-01-25 NOTE — Progress Notes (Signed)
PROGRESS NOTE  Veronica Horton UTM:[id number] DOB: 03-24-1961 DOA: 01/14/2022 PCP: [facility]  Brief History:  61 year old female with a history of COPD, tobacco abuse, chronic respiratory failure with tracheostomy on 6-8 L at home, laryngeal cancer status post tracheostomy, seizure disorder, GERD, hypertension, orthostatic hypotension presenting with generalized weakness and increasing left lower extremity redness and pain.  The patient visited the emergency department at [facility] on 12/27/2026 with lower extremity edema and pain.  The patient was discharged home from the ED with clindamycin with which she had been compliant.  She continued to have left lower extremity pain and edema.  She stated that she had her left leg against the wall approximately 1 week prior to this admission.  She denies any other recent injuries or falls.  She uses a walker at baseline.  She continues to smoke 1/2 pack/day Although the patient did not formally leave [place], she aggressively advocated for discharge on 01/09/2022. On 01/10/2022, the patient slid out of bed and went to the ED at [facility].  She refused any blood work and was subsequently discharged home. She presented again on 01/14/2022 with shortness of breath with nausea and vomiting.  Chest x-ray showed increased interstitial markings with moderate left pleural effusion and small right pleural effusion.  The patient was started on Lasix.  She was also started on vancomycin and cefepime for pneumonia.  Since admission to the hospital, the patient's condition has waxed and waned without much improvement.  Her mentation also has been waxing and waning.  Palliative medicine was consulted.  Pulmonary medicine was consulted.  She was started antibiotics and lasix.  Although she remained afebrile and hemodynamically stable, her overall medical condition continued to deteriorate with decline in mental status and refusing po  intake.  [place] discussions were held with sister and boyfriend.  They abdicated further decision making to patient's son.  [name] was contacted and favors a more comfort focus approach.  Hospital stay prolonged due to inability to find accepting residential hospice facility.  After speaking with son, he was agreeable for SNF placement with hospice services via [facility].    Assessment and Plan: Acute on chronic respiratory failure with hypoxia and hypercarbia -Multifactorial including aspiration pneumonitis, COPD, fluid overload -The patient is noncompliant with her prescribed diet -Currently stable on 8 L -She is chronically on 8 L at home -after [place] discussion with patient and son>>goal now is to focus on comfort measure hopeful for transition to residential hospice   Aspiration pneumonitis ([place]) CT chest on 01/04/2022 showed Multifocal pneumonia with acute on chronic sequela of aspiration 01/16/22 Speech therapy eval>>dys 3 with thin -MRSA+ -on vanc/cefepime--had 6 days -stopped cefepime due to altered mental status --after [place] discussion with patient and son>>goal now is to focus on comfort measure hopeful for transition to residential hospice   Acute on chronic diastolic CHF (congestive heart failure) ([place]) Clinically fluid overloaded initially with pleural effusions and interstitial edema transition to IV lasix as pt intermittently refuses pills>>d/c 9/22 up to uptrending creatinine and euvolemia -01/08/22 echo--EF 65 to 70%, no WMA. Normal RV; trivial TR -after [place] discussion with patient and son>>goal now is to focus on comfort measure hopeful for transition to residential hospice   Hypokalemia -repleted -last mag level 1.9 -Focusing on comfort care and symptomatic management only. -No further bloodwork to be done with intention to follow electrolytes trend.   Acute renal failure superimposed  on stage 3b chronic kidney disease ([place]) Baseline creatinine 1.6-1.8 Presented with  serum creatinine 2.44 Now fluid overloaded>>started IV lasix initially>>stop on 9/22 -after [place] discussion with patient and son>>goal now is to focus on comfort measure hopeful for transition to residential hospice   Acute metabolic Encephalopathy -remains confused intermittently but not agitated -B12 1968 -folate 31.4 -TSH 16.36, Free T4 = 1.12 -ammonia <10 -VBG 7.48/37/76/27 -obtain UA--no pyuria -order CT brain--No significant interval change in the size of the right parietal mass with associated mass effect and edema -MR brain--3.7 cm right parietal convexity meningioma with associated vasogenic edema, similar to prior; no other acute findings   Laryngeal cancer [facility]) - Patient has expressed not interested in further evaluation or treatment for the cancer -Palliative has seen her in the outpatient setting and planning to establish care more consistently -She is DNR. -after [place] discussion with patient and son>>goal now is to focus on comfort measure hopeful for transition to residential hospice VS SNF with hospice folow up -TOC on board to assist with transition of care.   Tracheostomy status (HCC) CHRONIC RESPIRATORY FAILURE WITH HYPOXIA - ON 6-8 L AT [place] in place; adequate saturation (with some intermittent hypoxic events when patient remove inner canula)   Tobacco abuse -Tobacco cessation discussed -patient declined nicotine patch.   Class 3 obesity (HCC) -Lifestyle modification were recommended before. -Body mass index is 41.79 kg/m.  -plan is for comfort care and symptomatic management only.   Hypertension -Stable overall -pt refusing po meds initially -now focus on comfort measures -follow VS and use PRN meds for comfort.   Goals of Care Met with son at bedside and discussed GOC -we discussed patient's overall declining medical condition despite optimal therapy.  We discussed that patient is worse than just a few weeks ago.  We discussed her overall poor  prognosis in the setting of her poor baseline function, numerous significant comorbidities and overall FTT and declining health condition.  We discussed her trajectory for recurrent and frequent hospitalizations.  He stated that patient wound not have wanted to live such a poor quality of life. He agrees with DNR and wishes to pursue full comfort measure approach. Plan to consult TOC for assistance with discharge and transition of care plans. All labs and radiographic studies at this point has been discontinued..   Family Communication:  son updated 9/24   Consultants:  palliative, pulm   Code Status:  DNR   DVT Prophylaxis: apixaban     Procedures: As Listed in Progress Note Above   Antibiotics: None Cefepime 9/16>>9/22 Vanc 9/17>>9/22    Subjective: Afebrile, no chest pain, no nausea or vomiting.  Appears comfortable and in not acute distress currently.  Patient has been intermittently removing her inner cannula from trach.   Objective: Vitals:   01/24/22 1321 01/24/22 1541 01/24/22 1933 01/25/22 0705  BP: 115/66  105/61   Pulse: 76  87 73  Resp: $Remo'18 17 19 16  'wxYsw$ Temp: 98 F (36.7 C)  98.2 F (36.8 C)   TempSrc: Oral  Oral   SpO2: 100% 100% (!) 86% 92%  Weight:      Height:        Intake/Output Summary (Last 24 hours) at 01/25/2022 1102 Last data filed at 01/25/2022 0554 Gross per 24 hour  Intake 980 ml  Output 1250 ml  Net -270 ml   Weight change:   Exam: General exam: Alert, awake and following commands. No fever.  Respiratory system: positive rhonchi, no using  accessory muscles. Cardiovascular system:RRR. No rubs or gallops. Unable to assess JVD due to body habitus. Gastrointestinal system: Abdomen is obese, nondistended, soft and nontender. No organomegaly or masses felt. Normal bowel sounds heard. Central nervous system: Alert and oriented. No focal neurological deficits. Extremities: No cyanosis or clubbing; 1+ edema appreciated bilaterally. Skin: No  petechiae; venous stasis dermatitis appreciated on both legs. RLE postero-lateral aspect with hematoma/bruise.  Psychiatry: flat affect   Data Reviewed: I have personally reviewed following labs and imaging studies Basic Metabolic Panel: Recent Labs  Lab 01/18/22 2233 01/19/22 0655 01/20/22 0318 01/21/22 0426 01/22/22 0505  NA 143 141  --  146* 141  K 3.0* 3.1*  --  3.2* 2.9*  CL 104 107  --  108 104  CO2 26 24  --  26 26  GLUCOSE 76 85  --  75 83  BUN 24* 22  --  28* 31*  CREATININE 2.10* 2.05* 2.48* 3.07* 3.39*  CALCIUM 8.8* 8.6*  --  8.9 9.0  MG 2.0 1.9  --  2.0  --    Liver Function Tests: Recent Labs  Lab 01/21/22 0426  AST 16  ALT 12  ALKPHOS 104  BILITOT 1.0  PROT 6.5  ALBUMIN 2.5*    Recent Labs  Lab 01/19/22 0655  AMMONIA <10   CBC: Recent Labs  Lab 01/19/22 0655 01/21/22 0426 01/22/22 0505  WBC 6.1 5.2 6.4  HGB 9.0* 9.8* 10.0*  HCT 29.2* 32.6* 33.1*  MCV 94.8 96.2 95.1  PLT 359 336 324   CBG: Recent Labs  Lab 01/20/22 1557 01/21/22 2154  GLUCAP 77 106*   Urine analysis:    Component Value Date/Time   COLORURINE STRAW (A) 01/19/2022 1800   APPEARANCEUR HAZY (A) 01/19/2022 1800   LABSPEC 1.009 01/19/2022 1800   PHURINE 5.0 01/19/2022 1800   GLUCOSEU NEGATIVE 01/19/2022 1800   HGBUR MODERATE (A) 01/19/2022 1800   BILIRUBINUR NEGATIVE 01/19/2022 1800   KETONESUR 5 (A) 01/19/2022 1800   PROTEINUR NEGATIVE 01/19/2022 1800   UROBILINOGEN 0.2 09/25/2010 0530   NITRITE NEGATIVE 01/19/2022 1800   LEUKOCYTESUR NEGATIVE 01/19/2022 1800   Sepsis Labs:  Recent Results (from the past 240 hour(s))  Culture, Respiratory w Gram Stain     Status: None   Collection Time: 01/17/22 11:45 AM   Specimen: Tracheal Aspirate; Respiratory  Result Value Ref Range Status   Specimen Description   Final    TRACHEAL ASPIRATE Performed at [facility], [address] [place], [place] [address]    Special Requests   Final    NONE Performed at [facility], [address], [place], [place] [address]    Gram Stain   Final    WBC PRESENT, PREDOMINANTLY MONONUCLEAR NO ORGANISMS SEEN Performed at [facility], [place] [address] [place], [place] [address]    Culture FEW CANDIDA TROPICALIS  Final   Report Status 01/19/2022 FINAL  Final  Urine Culture     Status: None   Collection Time: 01/19/22  6:00 PM   Specimen: Urine, Clean Catch  Result Value Ref Range Status   Specimen Description   Final    URINE, CLEAN CATCH Performed at [facility], [address] [place], [place] [address]    Special Requests   Final    NONE Performed at [facility], [address] [place], [place] [address]    Culture   Final    NO GROWTH Performed at [facility], [place] [address],  [place], [place] [address]    Report Status 01/20/2022 FINAL  Final     Scheduled Meds:  ipratropium-albuterol  3 mL Nebulization TID   levothyroxine  75 mcg Intravenous Daily   nystatin   Topical BID   Continuous Infusions:  levETIRAcetam 500 mg (01/25/22 1001)    Procedures/Studies: MR BRAIN WO CONTRAST  Result Date: 01/20/2022 CLINICAL DATA:  Initial evaluation for mental status change. EXAM: MRI HEAD WITHOUT CONTRAST TECHNIQUE: Multiplanar, multiecho pulse sequences of the brain and surrounding structures were obtained without intravenous contrast. COMPARISON:  Prior CT from 01/19/2022. FINDINGS: Brain: Examination severely degraded by motion artifact, and is nearly nondiagnostic. Cerebral volume within normal limits. No visible foci of restricted diffusion to suggest acute or subacute ischemia. No visible acute or chronic intracranial blood products. Previously identified meningioma centered at the right parietal convexity again seen, measuring up to approximately 3.7 cm in size. Associated regional mass effect with vasogenic edema, similar to prior CT. No other visible mass lesion or mass effect. No significant midline shift. Ventricles grossly within normal  limits for size without hydrocephalus. No extra-axial fluid collection. Pituitary gland suprasellar region grossly within normal limits. Vascular: Major intracranial vascular flow voids are grossly maintained at the skull base, although poorly assessed due to significant motion. Skull and upper cervical spine: Craniocervical junction within normal limits. Bone marrow signal intensity grossly normal. No scalp soft tissue abnormality. Sinuses/Orbits: Globes and orbital soft tissues grossly within normal limits. Paranasal sinuses are largely clear. No obvious significant mastoid effusion. Other: None. IMPRESSION: 1. Severely motion degraded and nearly nondiagnostic examination. 2. 3.7 cm right parietal convexity meningioma with associated vasogenic edema, similar to prior. 3. No other definite acute intracranial abnormality. Electronically Signed   By: Jeannine Boga M.D.   On: 01/20/2022 23:02   CT HEAD WO CONTRAST (5MM)  Result Date: 01/19/2022 CLINICAL DATA:  Altered mental status. EXAM: CT HEAD WITHOUT CONTRAST TECHNIQUE: Contiguous axial images were obtained from the base of the skull through the vertex without intravenous contrast. RADIATION DOSE REDUCTION: This exam was performed according to the departmental dose-optimization program which includes automated exposure control, adjustment of the mA and/or kV according to patient size and/or use of iterative reconstruction technique. COMPARISON:  Head CT dated 11/28/2020. FINDINGS: Evaluation of this exam is limited due to motion artifact. 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 This is relatively unchanged compared to prior CT. There is associated mass effect and edema in the right parietal lobe also similar to prior CT. There is no acute intracranial hemorrhage. No midline shift. No extra-axial fluid collection. Vascular: No  hyperdense vessel or unexpected calcification. Skull: Normal. Negative for fracture or focal lesion. Sinuses/Orbits: No acute finding. Other: None IMPRESSION: 1. No acute intracranial hemorrhage. 2. No significant interval change in the size of the right parietal mass with associated mass effect and edema. Electronically Signed   By: Anner Crete M.D.   On: 01/19/2022 18:31   DG CHEST PORT 1 VIEW  Result Date: 01/18/2022 CLINICAL DATA:  88110.  Shortness of breath. EXAM: PORTABLE CHEST 1 VIEW COMPARISON:  Portable 01/16/2022 at 8:37 a.m. FINDINGS: 4:56 a.m. An oxygen mask superimposes over the left apical area. There is a tracheostomy cannula with the tip 5.4 cm from the carina. There is a low inspiration. There is patchy consolidation, atelectasis or combination in the lower lung fields overlying small pleural effusions. The upper  lung fields remain clear. No new or worsened opacity is seen. There is mild cardiomegaly without CHF. Stable mediastinum with mild aortic atherosclerosis. Degenerative change thoracic spine and asymmetrically of the right shoulder. IMPRESSION: There previously was interstitial edema which is not seen today. Bibasilar opacities, atelectasis and/or pneumonia, and small underlying pleural effusions appear similar. Overall aeration is unchanged. Electronically Signed   By: Telford Nab M.D.   On: 01/18/2022 06:02   DG Chest Port 1 View  Result Date: 01/16/2022 CLINICAL DATA:  Respiratory distress EXAM: PORTABLE CHEST 1 VIEW COMPARISON:  Chest radiograph 01/15/2019 FINDINGS: Increased bilateral pleural effusions, left-greater-than-right. Right basilar linear and left basilar consolidative opacities may represent atelectasis, but superimposed infection is difficult to exclude. Increased interstitial opacities and peribronchial wall thickening are suggestive of mild pulmonary edema. There is a tracheostomy in place. Visualized upper abdomen is unremarkable. No acute osseous  abnormality. IMPRESSION: New mild pulmonary edema with moderate left and small right pleural effusions. Bibasilar opacities may represent atelectasis in the setting of worsening pleural effusions, but a component of superimposed infection is difficult to exclude. Electronically Signed   By: Marin Roberts M.D.   On: 01/16/2022 09:01   DG Chest Port 1 View  Result Date: 01/14/2022 CLINICAL DATA:  sob EXAM: PORTABLE CHEST 1 VIEW.  Patient is rotated. COMPARISON:  Chest x-ray 01/03/2022, CT chest 01/04/2022 FINDINGS: Tracheostomy tube with tip terminating 8.5 cm above the carina. The heart and mediastinal contours are grossly unchanged. Low lung volumes persistent retrocardiac airspace opacity. No pulmonary edema. Similar-appearing blunting of bilateral costophrenic angles with no significant pleural effusion. No pneumothorax. No acute osseous abnormality. IMPRESSION: 1. Low lung volumes, persistent retrocardiac airspace opacity with limited evaluation due to patient rotation. 2. Similar-appearing blunting of bilateral costophrenic angles with no significant pleural effusion. Electronically Signed   By: Iven Finn M.D.   On: 01/14/2022 19:49   ECHOCARDIOGRAM COMPLETE  Result Date: 01/07/2022    ECHOCARDIOGRAM REPORT   Patient Name:   BRITTANYA WINBURN Date of Exam: 01/07/2022 Medical Rec #:  [id number]       Height:       65.5 in Accession #:    [id number]      Weight:       255.0 lb Date of Birth:  [birth year]       BSA:          2.206 m Patient Age:    67 years        BP:           99/48 mmHg Patient Gender: F               HR:           72 bpm. Exam Location:  [facility] Procedure: 2D Echo, Color Doppler, Cardiac Doppler and Intracardiac            Opacification Agent Indications:    X83.33 Acute diastolic (congestive) heart failure  History:        Patient has prior history of Echocardiogram examinations, most                 recent 04/19/2021. CHF, Arrythmias:Atrial Fibrillation; Risk                  Factors:Hypertension, Diabetes, Dyslipidemia and Current Smoker.  Sonographer:    Raquel Sarna Senior RDCS Referring Phys: [phone] DAVID TAT  Sonographer Comments: Technically difficult due to patient body habitus. Patient is morbidly obese. IMPRESSIONS  1. Left ventricular ejection  fraction, by estimation, is 65 to 70%. The left ventricle has normal function. The left ventricle has no regional wall motion abnormalities. Left ventricular diastolic parameters were normal.  2. Right ventricular systolic function is normal. The right ventricular size is normal. Tricuspid regurgitation signal is inadequate for assessing PA pressure.  3. The mitral valve is normal in structure. No evidence of mitral valve regurgitation. No evidence of mitral stenosis.  4. The aortic valve was not well visualized. Aortic valve regurgitation is not visualized. No aortic stenosis is present.  5. The inferior vena cava is dilated in size with <50% respiratory variability, suggesting right atrial pressure of 15 mmHg. FINDINGS  Left Ventricle: Left ventricular ejection fraction, by estimation, is 65 to 70%. The left ventricle has normal function. The left ventricle has no regional wall motion abnormalities. Definity contrast agent was given IV to delineate the left ventricular  endocardial borders. The left ventricular internal cavity size was normal in size. There is no left ventricular hypertrophy. Left ventricular diastolic parameters were normal. Right Ventricle: The right ventricular size is normal. Right vetricular wall thickness was not well visualized. Right ventricular systolic function is normal. Tricuspid regurgitation signal is inadequate for assessing PA pressure. Left Atrium: Left atrial size was normal in size. Right Atrium: Right atrial size was normal in size. Pericardium: There is no evidence of pericardial effusion. Mitral Valve: The mitral valve is normal in structure. No evidence of mitral valve regurgitation. No evidence of mitral  valve stenosis. Tricuspid Valve: The tricuspid valve is not well visualized. Tricuspid valve regurgitation is trivial. No evidence of tricuspid stenosis. Aortic Valve: The aortic valve was not well visualized. Aortic valve regurgitation is not visualized. No aortic stenosis is present. Aortic valve mean gradient measures 4.0 mmHg. Aortic valve peak gradient measures 9.3 mmHg. Aortic valve area, by VTI measures 2.33 cm. Pulmonic Valve: The pulmonic valve was not well visualized. Pulmonic valve regurgitation is not visualized. No evidence of pulmonic stenosis. Aorta: The aortic root is normal in size and structure. Venous: The inferior vena cava is dilated in size with less than 50% respiratory variability, suggesting right atrial pressure of 15 mmHg. IAS/Shunts: The interatrial septum was not well visualized.  LEFT VENTRICLE PLAX 2D LVIDd:         4.60 cm LVIDs:         2.50 cm LV PW:         0.80 cm LV IVS:        1.00 cm LVOT diam:     1.80 cm LV SV:         67 LV SV Index:   30 LVOT Area:     2.54 cm  RIGHT VENTRICLE RV S prime:     13.30 cm/s TAPSE (M-mode): 2.0 cm LEFT ATRIUM             Index        RIGHT ATRIUM           Index LA diam:        3.60 cm 1.63 cm/m   RA Area:     20.30 cm LA Vol (A2C):   45.4 ml 20.58 ml/m  RA Volume:   64.40 ml  29.20 ml/m LA Vol (A4C):   63.1 ml 28.61 ml/m LA Biplane Vol: 54.0 ml 24.48 ml/m  AORTIC VALVE AV Area (Vmax):    2.06 cm AV Area (Vmean):   2.15 cm AV Area (VTI):     2.33 cm AV Vmax:  152.23 cm/s AV Vmean:          92.193 cm/s AV VTI:            0.289 m AV Peak Grad:      9.3 mmHg AV Mean Grad:      4.0 mmHg LVOT Vmax:         123.00 cm/s LVOT Vmean:        78.000 cm/s LVOT VTI:          0.264 m LVOT/AV VTI ratio: 0.91  AORTA Ao Root diam: 3.00 cm Ao Asc diam:  2.90 cm  SHUNTS Systemic VTI:  0.26 m Systemic Diam: 1.80 cm Carlyle Dolly MD Electronically signed by Carlyle Dolly MD Signature Date/Time: 01/07/2022/3:23:07 PM    Final    CT CHEST WO  CONTRAST  Result Date: 01/04/2022 CLINICAL DATA:  A 61 year old female with chronic dyspnea presents for evaluation in the setting of abnormal chest radiograph. EXAM: CT CHEST WITHOUT CONTRAST TECHNIQUE: Multidetector CT imaging of the chest was performed following the standard protocol without IV contrast. RADIATION DOSE REDUCTION: This exam was performed according to the departmental dose-optimization program which includes automated exposure control, adjustment of the mA and/or kV according to patient size and/or use of iterative reconstruction technique. COMPARISON:  December of 2021 FINDINGS: Cardiovascular: Scattered aortic atherosclerosis. Three-vessel coronary artery disease. Normal heart size without pericardial effusion or nodularity. Aberrant RIGHT subclavian artery. Mild engorgement of central pulmonary vasculature. Limited assessment of cardiovascular structures given lack of intravenous contrast. Mediastinum/Nodes: Tracheostomy tube terminating in the proximal trachea. No signs of adenopathy in the chest. No acute process in the mediastinum. Esophagus is grossly normal. Lungs/Pleura: Patchy airspace disease in the chest in LEFT upper lobe, lingula, LEFT lower lobe and RIGHT lung base. Volume loss in the LEFT lung base associated also with consolidative changes and material in bronchial structures. No pneumothorax. Trace pleural effusions. Airways are otherwise patent. Upper Abdomen: Incidental imaging of upper abdominal contents without acute process to the extent evaluated. Musculoskeletal: Spinal degenerative changes. No acute or destructive bone process. Rib fractures of varying ages, no acute fractures most are chronic or subacute and without displacement. Fracture at the T9 level with 20-30% loss of height. Vacuum phenomenon within the fracture site. No surrounding stranding. T11 with loss of height as well. These have occurred since December of 2021. No listhesis. IMPRESSION: 1. Multifocal  pneumonia with acute on chronic sequela of aspiration favored at the LEFT lung base. 2. Age-indeterminate compression fractures potentially subacute/recent at the T9 level and T11 level are new since 2021. Correlate with any history of prior fracture to these areas and with any current symptoms. 3. Aortic atherosclerosis and coronary artery disease. Aortic Atherosclerosis (ICD10-I70.0). Electronically Signed   By: Zetta Bills M.D.   On: 01/04/2022 17:23   DG Chest Portable 1 View  Result Date: 01/03/2022 CLINICAL DATA:  Provided history: Shortness of breath. EXAM: PORTABLE CHEST 1 VIEW COMPARISON:  Prior chest radiographs 08/08/2021 and earlier. FINDINGS: A tracheostomy tube is noted. Cardiomegaly. Central pulmonary vascular congestion. Prominence of the interstitial lung markings bilaterally, compatible with pulmonary interstitial edema. Irregular and nodular opacities within the right lung base. The nodular opacities measure up to 16 mm. Irregular opacities within the left lung base. No evidence of pneumothorax. No acute bony abnormality identified. IMPRESSION: Cardiomegaly with pulmonary interstitial edema. Opacities within bilateral lung bases, which may reflect atelectasis and/or pneumonia. Superimposed more nodular opacities within the right lung base. A chest CT is recommended to better  assess for possible pulmonary nodules at this site. Small left pleural effusion, which may be partially loculated. Electronically Signed   By: Kellie Simmering D.O.   On: 01/03/2022 14:50   US Venous Img Lower Bilateral  Result Date: 01/03/2022 CLINICAL DATA:  Bilateral open wounds. EXAM: BILATERAL LOWER EXTREMITY VENOUS DOPPLER ULTRASOUND TECHNIQUE: Gray-scale sonography with graded compression, as well as color Doppler and duplex ultrasound were performed to evaluate the lower extremity deep venous systems from the level of the common femoral vein and including the common femoral, femoral, profunda femoral, popliteal  and calf veins including the posterior tibial, peroneal and gastrocnemius veins when visible. The superficial great saphenous vein was also interrogated. Spectral Doppler was utilized to evaluate flow at rest and with distal augmentation maneuvers in the common femoral, femoral and popliteal veins. COMPARISON:  None Available. FINDINGS: RIGHT LOWER EXTREMITY Common Femoral Vein: No evidence of thrombus. Normal compressibility, respiratory phasicity and response to augmentation. Saphenofemoral Junction: No evidence of thrombus. Normal compressibility and flow on color Doppler imaging. Profunda Femoral Vein: No evidence of thrombus. Normal compressibility and flow on color Doppler imaging. Femoral Vein: No evidence of thrombus. Normal compressibility, respiratory phasicity and response to augmentation. Popliteal Vein: No evidence of thrombus. Normal compressibility, respiratory phasicity and response to augmentation. Calf Veins: Limited evaluation. Superficial Great Saphenous Vein: Limited evaluation. Other Findings:  Large amount of subcutaneous edema. LEFT LOWER EXTREMITY Common Femoral Vein: No evidence of thrombus. Normal compressibility, respiratory phasicity and response to augmentation. Saphenofemoral Junction: No evidence of thrombus. Normal compressibility and flow on color Doppler imaging. Profunda Femoral Vein: No evidence of thrombus. Normal compressibility and flow on color Doppler imaging. Femoral Vein: No evidence of thrombus. Normal compressibility, respiratory phasicity and response to augmentation. Popliteal Vein: No evidence of thrombus. Normal compressibility, respiratory phasicity and response to augmentation. Calf Veins: Limited evaluation. Superficial Great Saphenous Vein: No evidence of thrombus. Normal compressibility. Other Findings:  Subcutaneous edema. IMPRESSION: No evidence of deep venous thrombosis in either lower extremity. However, the study has technical limitations due to body  habitus and edema. Limited evaluation the bilateral deep calf veins. Electronically Signed   By: Markus Daft M.D.   On: 01/03/2022 13:45    [name], MD  [facility]  If 7PM-7AM, please contact night-coverage [email or web address] Password [email or web address] 01/25/2022, 11:02 AM   LOS: 11 days

## 2022-01-25 NOTE — Progress Notes (Signed)
[facility]) Hospital Liaison Note   The plan is for patient to discharge with hospice once placement has been arranged. [facility] will continue to follow patient in the hospital for discharge disposition.     [facility] information and contact numbers given to family and TOC.   Please call with any questions/concerns.    Thank you for the opportunity to participate in this patient's care.   Zigmund Gottron  [facility] Liaison  [phone]

## 2022-01-25 NOTE — Progress Notes (Signed)
Received call from RN that patient had pulled trach out.  Went into room and replaced trach.  Trach re-placement confirmed with CO2 detector.  Secured trach.  HR 99, O2 sat 90% on 10L 60% ATC.

## 2022-01-26 DIAGNOSIS — J690 Pneumonitis due to inhalation of food and vomit: Secondary | ICD-10-CM | POA: Diagnosis not present

## 2022-01-26 DIAGNOSIS — R1310 Dysphagia, unspecified: Secondary | ICD-10-CM | POA: Diagnosis not present

## 2022-01-26 DIAGNOSIS — T17908D Unspecified foreign body in respiratory tract, part unspecified causing other injury, subsequent encounter: Secondary | ICD-10-CM | POA: Diagnosis not present

## 2022-01-26 DIAGNOSIS — G9341 Metabolic encephalopathy: Secondary | ICD-10-CM | POA: Diagnosis not present

## 2022-01-26 MED ORDER — SCOPOLAMINE 1 MG/3DAYS TD PT72
1.0000 | MEDICATED_PATCH | TRANSDERMAL | Status: DC
  Administered 2022-01-26 – 2022-02-01 (×3): 1.5 mg via TRANSDERMAL
  Filled 2022-01-26 (×3): qty 1

## 2022-01-26 NOTE — Progress Notes (Signed)
Updated patient's sister [name] on patient condition.

## 2022-01-26 NOTE — Progress Notes (Signed)
PROGRESS NOTE  Veronica Horton:[id number] DOB: Oct 13, 1960 DOA: 01/14/2022 PCP: [name]  Brief History:  61 year old female with a history of COPD, tobacco abuse, chronic respiratory failure with tracheostomy on 6-8 L at home, laryngeal cancer status post tracheostomy, seizure disorder, GERD, hypertension, orthostatic hypotension presenting with generalized weakness and increasing left lower extremity redness and pain.  The patient visited the emergency department at [facility] on 12/27/2026 with lower extremity edema and pain.  The patient was discharged home from the ED with clindamycin with which she had been compliant.  She continued to have left lower extremity pain and edema.  She stated that she had her left leg against the wall approximately 1 week prior to this admission.  She denies any other recent injuries or falls.  She uses a walker at baseline.  She continues to smoke 1/2 pack/day Although the patient did not formally leave [place], she aggressively advocated for discharge on 01/09/2022. On 01/10/2022, the patient slid out of bed and went to the ED at [facility].  She refused any blood work and was subsequently discharged home. She presented again on 01/14/2022 with shortness of breath with nausea and vomiting.  Chest x-ray showed increased interstitial markings with moderate left pleural effusion and small right pleural effusion.  The patient was started on Lasix.  She was also started on vancomycin and cefepime for pneumonia.  Since admission to the hospital, the patient's condition has waxed and waned without much improvement.  Her mentation also has been waxing and waning.  Palliative medicine was consulted.  Pulmonary medicine was consulted.  She was started antibiotics and lasix.  Although she remained afebrile and hemodynamically stable, her overall medical condition continued to deteriorate with decline in mental status and refusing po  intake.  [place] discussions were held with sister and boyfriend.  They abdicated further decision making to patient's son.  [name] was contacted and favors a more comfort focus approach.  Hospital stay prolonged due to inability to find accepting residential hospice facility.  After speaking with son, he was agreeable for SNF placement with hospice services via [facility].    Assessment and Plan: Acute on chronic respiratory failure with hypoxia and hypercarbia -Multifactorial including aspiration pneumonitis, COPD, fluid overload -The patient is noncompliant with her prescribed diet -Currently stable on 10 L; (She is chronically on 8 L at home) -after [place] discussion with patient and son>>goal now is to focus on comfort measure hopeful for transition to residential hospice. -will add scopolamine patch to help with secretions.    Aspiration pneumonitis (HCC) CT chest on 01/04/2022 showed Multifocal pneumonia with acute on chronic sequela of aspiration 01/16/22 Speech therapy eval>>dys 3 with thin -MRSA+ -on vanc/cefepime--had 6 days -stopped cefepime due to altered mental status --after [place] discussion with patient and son>>goal now is to focus on comfort measure hopeful for transition to residential hospice   Acute on chronic diastolic CHF (congestive heart failure) ([place]) Clinically fluid overloaded initially with pleural effusions and interstitial edema transition to IV lasix as pt intermittently refuses pills>>d/c 9/22 up to uptrending creatinine and euvolemia -01/08/22 echo--EF 65 to 70%, no WMA. Normal RV; trivial TR -after [place] discussion with patient and son>>goal now is to focus on comfort measure hopeful for transition to residential hospice   Hypokalemia -repleted -last mag level 1.9 -Focusing on comfort care and symptomatic management only. -No further bloodwork to be done with intention to  follow electrolytes trend.   Acute renal failure superimposed on stage 3b chronic kidney  disease ([place]) Baseline creatinine 1.6-1.8 Presented with serum creatinine 2.44 Now fluid overloaded>>started IV lasix initially>>stop on 9/22 -after [place] discussion with patient and son>>goal now is to focus on comfort measure hopeful for transition to residential hospice   Acute metabolic Encephalopathy -remains confused intermittently but not agitated -B12 1968 -folate 31.4 -TSH 16.36, Free T4 = 1.12 -ammonia <10 -VBG 7.48/37/76/27 -obtain UA--no pyuria -order CT brain--No significant interval change in the size of the right parietal mass with associated mass effect and edema -MR brain--3.7 cm right parietal convexity meningioma with associated vasogenic edema, similar to prior; no other acute findings   Laryngeal cancer [facility]) - Patient has expressed not interested in further evaluation or treatment for the cancer -Palliative has seen her in the outpatient setting and planning to establish care more consistently -She is DNR. -after [place] discussion with patient and son>>goal now is to focus on comfort measure hopeful for transition to residential hospice VS SNF with hospice folow up -TOC on board to assist with transition of care.   Tracheostomy status (HCC) CHRONIC RESPIRATORY FAILURE WITH HYPOXIA - ON 6-8 L AT [place] in place; adequate saturation (with some intermittent hypoxic events when patient remove inner canula)   Tobacco abuse -Tobacco cessation discussed -patient declined nicotine patch.   Class 3 obesity (HCC) -Lifestyle modification were recommended before. -Body mass index is 41.79 kg/m.  -plan is for comfort care and symptomatic management only.   Hypertension -Stable overall -pt refusing po meds initially -now focus on comfort measures -follow VS and use PRN meds for comfort.   Goals of Care Met with son at bedside and discussed GOC -we discussed patient's overall declining medical condition despite optimal therapy.  We discussed that patient is worse  than just a few weeks ago.  We discussed her overall poor prognosis in the setting of her poor baseline function, numerous significant comorbidities and overall FTT and declining health condition.  We discussed her trajectory for recurrent and frequent hospitalizations.  He stated that patient wound not have wanted to live such a poor quality of life. He agrees with DNR and wishes to pursue full comfort measure approach. Plan to consult TOC for assistance with discharge and transition of care plans. All labs and radiographic studies at this point has been discontinued..   Family Communication:  no family at bedside.   Consultants:  palliative, pulm   Code Status:  DNR   DVT Prophylaxis: apixaban     Procedures: As Listed in Progress Note Above   Antibiotics: None Cefepime 9/16>>9/22 Vanc 9/17>>9/22    Subjective: No fever, currently calm and in no distress, episodes of hypoxia noticed overnight after pulling off her trach.  Objective: Vitals:   01/25/22 1500 01/25/22 1931 01/26/22 0519 01/26/22 0754  BP:   103/69   Pulse:   96   Resp: 18     Temp:   98.9 F (37.2 C)   TempSrc:      SpO2: 94% 92% 94% 90%  Weight:      Height:        Intake/Output Summary (Last 24 hours) at 01/26/2022 1025 Last data filed at 01/26/2022 7035 Gross per 24 hour  Intake 200 ml  Output 400 ml  Net -200 ml   Weight change:   Exam: General exam: Alert, awake and following simple commands. No fever. Overnight with episode of hypoxia after pulling her trach out. Respiratory  system: no wheezing, no crackles, positive rhonchi appreciated. Upper airway/trach secretions sounds heard on exam. Cardiovascular system:no rubs or gallops. Rate controlled. Gastrointestinal system: Abdomen is obese, non-distended, soft and nontender. No organomegaly or masses felt. Normal bowel sounds heard. Central nervous system: Alert and oriented. No focal neurological deficits. Extremities: No cyanosis, no clubbing.  1 + edema appreciated bilaterally Skin: No petechiae. RLE posterior aspect with bruises/hematoma and tenderness to palpation. Psychiatry: depressed mood, flat affect.  Data Reviewed: I have personally reviewed following labs and imaging studies  Basic Metabolic Panel: Recent Labs  Lab 01/20/22 0318 01/21/22 0426 01/22/22 0505  NA  --  146* 141  K  --  3.2* 2.9*  CL  --  108 104  CO2  --  26 26  GLUCOSE  --  75 83  BUN  --  28* 31*  CREATININE 2.48* 3.07* 3.39*  CALCIUM  --  8.9 9.0  MG  --  2.0  --    Liver Function Tests: Recent Labs  Lab 01/21/22 0426  AST 16  ALT 12  ALKPHOS 104  BILITOT 1.0  PROT 6.5  ALBUMIN 2.5*    No results for input(s): "AMMONIA" in the last 168 hours.  CBC: Recent Labs  Lab 01/21/22 0426 01/22/22 0505  WBC 5.2 6.4  HGB 9.8* 10.0*  HCT 32.6* 33.1*  MCV 96.2 95.1  PLT 336 324   CBG: Recent Labs  Lab 01/20/22 1557 01/21/22 2154  GLUCAP 77 106*   Urine analysis:    Component Value Date/Time   COLORURINE STRAW (A) 01/19/2022 1800   APPEARANCEUR HAZY (A) 01/19/2022 1800   LABSPEC 1.009 01/19/2022 1800   PHURINE 5.0 01/19/2022 1800   GLUCOSEU NEGATIVE 01/19/2022 1800   HGBUR MODERATE (A) 01/19/2022 1800   BILIRUBINUR NEGATIVE 01/19/2022 1800   KETONESUR 5 (A) 01/19/2022 1800   PROTEINUR NEGATIVE 01/19/2022 1800   UROBILINOGEN 0.2 09/25/2010 0530   NITRITE NEGATIVE 01/19/2022 1800   LEUKOCYTESUR NEGATIVE 01/19/2022 1800   Sepsis Labs:  Recent Results (from the past 240 hour(s))  Culture, Respiratory w Gram Stain     Status: None   Collection Time: 01/17/22 11:45 AM   Specimen: Tracheal Aspirate; Respiratory  Result Value Ref Range Status   Specimen Description   Final    TRACHEAL ASPIRATE Performed at [facility], [address] [place], [place] [address]    Special Requests   Final    NONE Performed at [facility], [address] [place], [place] [address]    Gram Stain   Final    WBC PRESENT, PREDOMINANTLY  MONONUCLEAR NO ORGANISMS SEEN Performed at [facility], [place] [address] [place], [place] [address]    Culture FEW CANDIDA TROPICALIS  Final   Report Status 01/19/2022 FINAL  Final  Urine Culture     Status: None   Collection Time: 01/19/22  6:00 PM   Specimen: Urine, Clean Catch  Result Value Ref Range Status   Specimen Description   Final    URINE, CLEAN CATCH Performed at [facility], [address] [place], [place] [address]    Special Requests   Final    NONE Performed at [facility], [address] [place], [place] [address]    Culture   Final    NO GROWTH Performed at [facility], [place] [address] [place], [place] [address]    Report Status 01/20/2022 FINAL  Final     Scheduled Meds:  ipratropium-albuterol  3 mL Nebulization  TID   levothyroxine  75 mcg Intravenous Daily   nystatin   Topical BID   Continuous Infusions:  levETIRAcetam 500 mg (01/26/22 0949)    Procedures/Studies: MR BRAIN WO CONTRAST  Result Date: 01/20/2022 CLINICAL DATA:  Initial evaluation for mental status change. EXAM: MRI HEAD WITHOUT CONTRAST TECHNIQUE: Multiplanar, multiecho pulse sequences of the brain and surrounding structures were obtained without intravenous contrast. COMPARISON:  Prior CT from 01/19/2022. FINDINGS: Brain: Examination severely degraded by motion artifact, and is nearly nondiagnostic. Cerebral volume within normal limits. No visible foci of restricted diffusion to suggest acute or subacute ischemia. No visible acute or chronic intracranial blood products. Previously identified meningioma centered at the right parietal convexity again seen, measuring up to approximately 3.7 cm in size. Associated regional mass effect with vasogenic edema, similar to prior CT. No other visible mass lesion or mass effect. No significant midline shift. Ventricles grossly within normal limits for size without hydrocephalus. No extra-axial fluid collection. Pituitary gland suprasellar  region grossly within normal limits. Vascular: Major intracranial vascular flow voids are grossly maintained at the skull base, although poorly assessed due to significant motion. Skull and upper cervical spine: Craniocervical junction within normal limits. Bone marrow signal intensity grossly normal. No scalp soft tissue abnormality. Sinuses/Orbits: Globes and orbital soft tissues grossly within normal limits. Paranasal sinuses are largely clear. No obvious significant mastoid effusion. Other: None. IMPRESSION: 1. Severely motion degraded and nearly nondiagnostic examination. 2. 3.7 cm right parietal convexity meningioma with associated vasogenic edema, similar to prior. 3. No other definite acute intracranial abnormality. Electronically Signed   By: Jeannine Boga M.D.   On: 01/20/2022 23:02   CT HEAD WO CONTRAST (5MM)  Result Date: 01/19/2022 CLINICAL DATA:  Altered mental status. EXAM: CT HEAD WITHOUT CONTRAST TECHNIQUE: Contiguous axial images were obtained from the base of the skull through the vertex without intravenous contrast. RADIATION DOSE REDUCTION: This exam was performed according to the departmental dose-optimization program which includes automated exposure control, adjustment of the mA and/or kV according to patient size and/or use of iterative reconstruction technique. COMPARISON:  Head CT dated 11/28/2020. FINDINGS: Evaluation of this exam is limited due to motion artifact. 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 This is relatively unchanged compared to prior CT. There is associated mass effect and edema in the right parietal lobe also similar to prior CT. There is no acute intracranial hemorrhage. No midline shift. No extra-axial fluid collection. Vascular: No hyperdense vessel or unexpected calcification. Skull: Normal. Negative for fracture or focal lesion.  Sinuses/Orbits: No acute finding. Other: None IMPRESSION: 1. No acute intracranial hemorrhage. 2. No significant interval change in the size of the right parietal mass with associated mass effect and edema. Electronically Signed   By: Anner Crete M.D.   On: 01/19/2022 18:31   DG CHEST PORT 1 VIEW  Result Date: 01/18/2022 CLINICAL DATA:  89[address].  Shortness of breath. EXAM: PORTABLE CHEST 1 VIEW COMPARISON:  Portable 01/16/2022 at 8:37 a.m. FINDINGS: 4:56 a.m. An oxygen mask superimposes over the left apical area. There is a tracheostomy cannula with the tip 5.4 cm from the carina. There is a low inspiration. There is patchy consolidation, atelectasis or combination in the lower lung fields overlying small pleural effusions. The upper lung fields remain clear. No new or worsened opacity is seen. There is mild cardiomegaly without CHF. Stable mediastinum with mild aortic atherosclerosis. Degenerative  change thoracic spine and asymmetrically of the right shoulder. IMPRESSION: There previously was interstitial edema which is not seen today. Bibasilar opacities, atelectasis and/or pneumonia, and small underlying pleural effusions appear similar. Overall aeration is unchanged. Electronically Signed   By: Telford Nab M.D.   On: 01/18/2022 06:02   DG Chest Port 1 View  Result Date: 01/16/2022 CLINICAL DATA:  Respiratory distress EXAM: PORTABLE CHEST 1 VIEW COMPARISON:  Chest radiograph 01/15/2019 FINDINGS: Increased bilateral pleural effusions, left-greater-than-right. Right basilar linear and left basilar consolidative opacities may represent atelectasis, but superimposed infection is difficult to exclude. Increased interstitial opacities and peribronchial wall thickening are suggestive of mild pulmonary edema. There is a tracheostomy in place. Visualized upper abdomen is unremarkable. No acute osseous abnormality. IMPRESSION: New mild pulmonary edema with moderate left and small right pleural effusions.  Bibasilar opacities may represent atelectasis in the setting of worsening pleural effusions, but a component of superimposed infection is difficult to exclude. Electronically Signed   By: Marin Roberts M.D.   On: 01/16/2022 09:01   DG Chest Port 1 View  Result Date: 01/14/2022 CLINICAL DATA:  sob EXAM: PORTABLE CHEST 1 VIEW.  Patient is rotated. COMPARISON:  Chest x-ray 01/03/2022, CT chest 01/04/2022 FINDINGS: Tracheostomy tube with tip terminating 8.5 cm above the carina. The heart and mediastinal contours are grossly unchanged. Low lung volumes persistent retrocardiac airspace opacity. No pulmonary edema. Similar-appearing blunting of bilateral costophrenic angles with no significant pleural effusion. No pneumothorax. No acute osseous abnormality. IMPRESSION: 1. Low lung volumes, persistent retrocardiac airspace opacity with limited evaluation due to patient rotation. 2. Similar-appearing blunting of bilateral costophrenic angles with no significant pleural effusion. Electronically Signed   By: Iven Finn M.D.   On: 01/14/2022 19:49   ECHOCARDIOGRAM COMPLETE  Result Date: 01/07/2022    ECHOCARDIOGRAM REPORT   Patient Name:   Veronica Horton Date of Exam: 01/07/2022 Medical Rec #:  [id number]       Height:       65.5 in Accession #:    [id number]      Weight:       255.0 lb Date of Birth:  14-Mar-1961       BSA:          2.206 m Patient Age:    68 years        BP:           99/48 mmHg Patient Gender: F               HR:           72 bpm. Exam Location:  [facility] Procedure: 2D Echo, Color Doppler, Cardiac Doppler and Intracardiac            Opacification Agent Indications:    M57.84 Acute diastolic (congestive) heart failure  History:        Patient has prior history of Echocardiogram examinations, most                 recent 04/19/2021. CHF, Arrythmias:Atrial Fibrillation; Risk                 Factors:Hypertension, Diabetes, Dyslipidemia and Current Smoker.  Sonographer:    Raquel Sarna Senior RDCS Referring  Phys: [phone] DAVID TAT  Sonographer Comments: Technically difficult due to patient body habitus. Patient is morbidly obese. IMPRESSIONS  1. Left ventricular ejection fraction, by estimation, is 65 to 70%. The left ventricle has normal function. The left ventricle has no regional wall motion abnormalities. Left ventricular  diastolic parameters were normal.  2. Right ventricular systolic function is normal. The right ventricular size is normal. Tricuspid regurgitation signal is inadequate for assessing PA pressure.  3. The mitral valve is normal in structure. No evidence of mitral valve regurgitation. No evidence of mitral stenosis.  4. The aortic valve was not well visualized. Aortic valve regurgitation is not visualized. No aortic stenosis is present.  5. The inferior vena cava is dilated in size with <50% respiratory variability, suggesting right atrial pressure of 15 mmHg. FINDINGS  Left Ventricle: Left ventricular ejection fraction, by estimation, is 65 to 70%. The left ventricle has normal function. The left ventricle has no regional wall motion abnormalities. Definity contrast agent was given IV to delineate the left ventricular  endocardial borders. The left ventricular internal cavity size was normal in size. There is no left ventricular hypertrophy. Left ventricular diastolic parameters were normal. Right Ventricle: The right ventricular size is normal. Right vetricular wall thickness was not well visualized. Right ventricular systolic function is normal. Tricuspid regurgitation signal is inadequate for assessing PA pressure. Left Atrium: Left atrial size was normal in size. Right Atrium: Right atrial size was normal in size. Pericardium: There is no evidence of pericardial effusion. Mitral Valve: The mitral valve is normal in structure. No evidence of mitral valve regurgitation. No evidence of mitral valve stenosis. Tricuspid Valve: The tricuspid valve is not well visualized. Tricuspid valve regurgitation is  trivial. No evidence of tricuspid stenosis. Aortic Valve: The aortic valve was not well visualized. Aortic valve regurgitation is not visualized. No aortic stenosis is present. Aortic valve mean gradient measures 4.0 mmHg. Aortic valve peak gradient measures 9.3 mmHg. Aortic valve area, by VTI measures 2.33 cm. Pulmonic Valve: The pulmonic valve was not well visualized. Pulmonic valve regurgitation is not visualized. No evidence of pulmonic stenosis. Aorta: The aortic root is normal in size and structure. Venous: The inferior vena cava is dilated in size with less than 50% respiratory variability, suggesting right atrial pressure of 15 mmHg. IAS/Shunts: The interatrial septum was not well visualized.  LEFT VENTRICLE PLAX 2D LVIDd:         4.60 cm LVIDs:         2.50 cm LV PW:         0.80 cm LV IVS:        1.00 cm LVOT diam:     1.80 cm LV SV:         67 LV SV Index:   30 LVOT Area:     2.54 cm  RIGHT VENTRICLE RV S prime:     13.30 cm/s TAPSE (M-mode): 2.0 cm LEFT ATRIUM             Index        RIGHT ATRIUM           Index LA diam:        3.60 cm 1.63 cm/m   RA Area:     20.30 cm LA Vol (A2C):   45.4 ml 20.58 ml/m  RA Volume:   64.40 ml  29.20 ml/m LA Vol (A4C):   63.1 ml 28.61 ml/m LA Biplane Vol: 54.0 ml 24.48 ml/m  AORTIC VALVE AV Area (Vmax):    2.06 cm AV Area (Vmean):   2.15 cm AV Area (VTI):     2.33 cm AV Vmax:           152.23 cm/s AV Vmean:          92.193 cm/s AV  VTI:            0.289 m AV Peak Grad:      9.3 mmHg AV Mean Grad:      4.0 mmHg LVOT Vmax:         123.00 cm/s LVOT Vmean:        78.000 cm/s LVOT VTI:          0.264 m LVOT/AV VTI ratio: 0.91  AORTA Ao Root diam: 3.00 cm Ao Asc diam:  2.90 cm  SHUNTS Systemic VTI:  0.26 m Systemic Diam: 1.80 cm Carlyle Dolly MD Electronically signed by Carlyle Dolly MD Signature Date/Time: 01/07/2022/3:23:07 PM    Final    CT CHEST WO CONTRAST  Result Date: 01/04/2022 CLINICAL DATA:  A 61 year old female with chronic dyspnea presents for  evaluation in the setting of abnormal chest radiograph. EXAM: CT CHEST WITHOUT CONTRAST TECHNIQUE: Multidetector CT imaging of the chest was performed following the standard protocol without IV contrast. RADIATION DOSE REDUCTION: This exam was performed according to the departmental dose-optimization program which includes automated exposure control, adjustment of the mA and/or kV according to patient size and/or use of iterative reconstruction technique. COMPARISON:  December of 2021 FINDINGS: Cardiovascular: Scattered aortic atherosclerosis. Three-vessel coronary artery disease. Normal heart size without pericardial effusion or nodularity. Aberrant RIGHT subclavian artery. Mild engorgement of central pulmonary vasculature. Limited assessment of cardiovascular structures given lack of intravenous contrast. Mediastinum/Nodes: Tracheostomy tube terminating in the proximal trachea. No signs of adenopathy in the chest. No acute process in the mediastinum. Esophagus is grossly normal. Lungs/Pleura: Patchy airspace disease in the chest in LEFT upper lobe, lingula, LEFT lower lobe and RIGHT lung base. Volume loss in the LEFT lung base associated also with consolidative changes and material in bronchial structures. No pneumothorax. Trace pleural effusions. Airways are otherwise patent. Upper Abdomen: Incidental imaging of upper abdominal contents without acute process to the extent evaluated. Musculoskeletal: Spinal degenerative changes. No acute or destructive bone process. Rib fractures of varying ages, no acute fractures most are chronic or subacute and without displacement. Fracture at the T9 level with 20-30% loss of height. Vacuum phenomenon within the fracture site. No surrounding stranding. T11 with loss of height as well. These have occurred since December of 2021. No listhesis. IMPRESSION: 1. Multifocal pneumonia with acute on chronic sequela of aspiration favored at the LEFT lung base. 2. Age-indeterminate  compression fractures potentially subacute/recent at the T9 level and T11 level are new since 2021. Correlate with any history of prior fracture to these areas and with any current symptoms. 3. Aortic atherosclerosis and coronary artery disease. Aortic Atherosclerosis (ICD10-I70.0). Electronically Signed   By: Zetta Bills M.D.   On: 01/04/2022 17:23   DG Chest Portable 1 View  Result Date: 01/03/2022 CLINICAL DATA:  Provided history: Shortness of breath. EXAM: PORTABLE CHEST 1 VIEW COMPARISON:  Prior chest radiographs 08/08/2021 and earlier. FINDINGS: A tracheostomy tube is noted. Cardiomegaly. Central pulmonary vascular congestion. Prominence of the interstitial lung markings bilaterally, compatible with pulmonary interstitial edema. Irregular and nodular opacities within the right lung base. The nodular opacities measure up to 16 mm. Irregular opacities within the left lung base. No evidence of pneumothorax. No acute bony abnormality identified. IMPRESSION: Cardiomegaly with pulmonary interstitial edema. Opacities within bilateral lung bases, which may reflect atelectasis and/or pneumonia. Superimposed more nodular opacities within the right lung base. A chest CT is recommended to better assess for possible pulmonary nodules at this site. Small left pleural effusion, which may be partially  loculated. Electronically Signed   By: Kellie Simmering D.O.   On: 01/03/2022 14:50   US Venous Img Lower Bilateral  Result Date: 01/03/2022 CLINICAL DATA:  Bilateral open wounds. EXAM: BILATERAL LOWER EXTREMITY VENOUS DOPPLER ULTRASOUND TECHNIQUE: Gray-scale sonography with graded compression, as well as color Doppler and duplex ultrasound were performed to evaluate the lower extremity deep venous systems from the level of the common femoral vein and including the common femoral, femoral, profunda femoral, popliteal and calf veins including the posterior tibial, peroneal and gastrocnemius veins when visible. The  superficial great saphenous vein was also interrogated. Spectral Doppler was utilized to evaluate flow at rest and with distal augmentation maneuvers in the common femoral, femoral and popliteal veins. COMPARISON:  None Available. FINDINGS: RIGHT LOWER EXTREMITY Common Femoral Vein: No evidence of thrombus. Normal compressibility, respiratory phasicity and response to augmentation. Saphenofemoral Junction: No evidence of thrombus. Normal compressibility and flow on color Doppler imaging. Profunda Femoral Vein: No evidence of thrombus. Normal compressibility and flow on color Doppler imaging. Femoral Vein: No evidence of thrombus. Normal compressibility, respiratory phasicity and response to augmentation. Popliteal Vein: No evidence of thrombus. Normal compressibility, respiratory phasicity and response to augmentation. Calf Veins: Limited evaluation. Superficial Great Saphenous Vein: Limited evaluation. Other Findings:  Large amount of subcutaneous edema. LEFT LOWER EXTREMITY Common Femoral Vein: No evidence of thrombus. Normal compressibility, respiratory phasicity and response to augmentation. Saphenofemoral Junction: No evidence of thrombus. Normal compressibility and flow on color Doppler imaging. Profunda Femoral Vein: No evidence of thrombus. Normal compressibility and flow on color Doppler imaging. Femoral Vein: No evidence of thrombus. Normal compressibility, respiratory phasicity and response to augmentation. Popliteal Vein: No evidence of thrombus. Normal compressibility, respiratory phasicity and response to augmentation. Calf Veins: Limited evaluation. Superficial Great Saphenous Vein: No evidence of thrombus. Normal compressibility. Other Findings:  Subcutaneous edema. IMPRESSION: No evidence of deep venous thrombosis in either lower extremity. However, the study has technical limitations due to body habitus and edema. Limited evaluation the bilateral deep calf veins. Electronically Signed   By: Markus Daft M.D.   On: 01/03/2022 13:45    [name], MD  [facility]  If 7PM-7AM, please contact night-coverage [email or web address] Password [email or web address] 01/26/2022, 10:25 AM   LOS: 12 days

## 2022-01-26 NOTE — Progress Notes (Signed)
Have suctioned patient a total of 4 times today.

## 2022-01-26 NOTE — Progress Notes (Signed)
[facility] [address] [facility]) Hospital Liaison Note   This is a pending patient of [facility]. We will continue to follow patient in the hospital for discharge disposition.     [facility] information and contact numbers given to family and TOC.   Please call with any questions/concerns.    Thank you for the opportunity to participate in this patient's care.   Zigmund Gottron  [facility] Liaison  [phone]

## 2022-01-26 NOTE — Progress Notes (Signed)
Nurse called pt pulled trach out again, trach replaced confirmed with co2 detector, trach secure, spo2 64%  increasec o2 to 98% to get o2 sats to 89% decreased back down to 60% 10lpm ATC spo2 83-85%

## 2022-01-26 NOTE — Progress Notes (Signed)
Patient suctioned via trach site. Small amount of secretions.

## 2022-01-26 NOTE — TOC Progression Note (Signed)
Transition of Care [facility]) - Progression Note    Patient Details  Name: Veronica Horton MRN: [id number] Date of Birth: 06-13-1960  [facility]) CM/SW Contact  Shade Flood, LCSW Phone Number: 01/26/2022, 11:45 AM  Clinical Narrative:     TOC following. Spoke with the English as a second language teacher for [facility] regarding pending referral. Was informed that pt needs to be on 6L max O2 and also can only require suctioning once each shift. Pt does not meet this criteria at this time. They will re-consider pt if she reaches the point where she meets that criteria.   Contacted Angie at [facility] and referral sent. Angie states they do not currently have a female bed available but that they may soon.  TOC will continue to work on finding placement for pt.  Expected Discharge Plan: [place] Barriers to Discharge: Continued Medical Work up  Expected Discharge Plan and Services Expected Discharge Plan: [place] In-house Referral: Clinical Social Work   Post Acute Care Choice: [place] Living arrangements for the past 2 months: [place] Determinants of Health (SDOH) Interventions    Readmission Risk Interventions    08/09/2021    2:37 PM 04/26/2020   10:28 AM  Readmission Risk Prevention Plan  Transportation Screening Complete Complete  Medication Review (RN Care Manager) Complete Complete  PCP or Specialist appointment within 3-5 days of discharge  Complete  HRI or [place] Complete Complete  SW Recovery Care/Counseling Consult Complete Complete  Palliative Care Screening Not Applicable Not [place] Not Applicable Patient Refused

## 2022-01-27 DIAGNOSIS — T17908D Unspecified foreign body in respiratory tract, part unspecified causing other injury, subsequent encounter: Secondary | ICD-10-CM | POA: Diagnosis not present

## 2022-01-27 DIAGNOSIS — J690 Pneumonitis due to inhalation of food and vomit: Secondary | ICD-10-CM | POA: Diagnosis not present

## 2022-01-27 DIAGNOSIS — G9341 Metabolic encephalopathy: Secondary | ICD-10-CM | POA: Diagnosis not present

## 2022-01-27 DIAGNOSIS — R1310 Dysphagia, unspecified: Secondary | ICD-10-CM | POA: Diagnosis not present

## 2022-01-27 NOTE — TOC Progression Note (Signed)
Transition of Care [facility]) - Progression Note    Patient Details  Name: Veronica Horton MRN: [id number] Date of Birth: Apr 19, 1961  Transition [facility]) CM/SW Contact  Shade Flood, LCSW Phone Number: 01/27/2022, 11:58 AM  Clinical Narrative:     TOC following. Spoke with CM, Lonn Georgia [phone]), at pt's Medicaid plan to discuss coverage for possible placement at [facility] as [facility] informed this LCSW yesterday that they do not have any contracts with Managed Medicaid plans. Per Lonn Georgia, they are able to negotiate single case agreements with SNFs if the facility is agreeable.   Also spoke with Judson Roch at [facility] to inquire if patients with managed medicaid revert to traditional medicaid when they sign on to hospice and she and her colleagues indicated that they believe this is the case.  Spoke with Angie at [facility] [phone]) to update on above. Per Angie, the SNF has negotiated single case agreements in the past so she believes either way it goes, they should be able to figure out the financial aspect. Angie states she will forward pt's clinical information to her team to determine if they can meet pt's clinical needs. She states that if they can, they would not be able to admit patient until one of their female beds becomes available. Per Angie, they are currently working to discharge two of their current patients but she could not give a specific timeframe.  Updated TOC supervisor and will continue to follow.  Expected Discharge Plan: [place] Barriers to Discharge: Continued Medical Work up  Expected Discharge Plan and Services Expected Discharge Plan: [place] In-house Referral: Clinical Social Work   Post Acute Care Choice: [place] Living arrangements for the past 2 months: [place] Determinants of Health (SDOH) Interventions     Readmission Risk Interventions    08/09/2021    2:37 PM 04/26/2020   10:28 AM  Readmission Risk Prevention Plan  Transportation Screening Complete Complete  Medication Review (RN Care Manager) Complete Complete  PCP or Specialist appointment within 3-5 days of discharge  Complete  HRI or [place] Complete Complete  SW Recovery Care/Counseling Consult Complete Complete  Palliative Care Screening Not Applicable Not [place] Not Applicable Patient Refused

## 2022-01-27 NOTE — Progress Notes (Signed)
RT entered room to administer 1400 neb treatment to find patient has dislodged her trach again. Veronica Horton was reinserted without complication by RT. BBS noted. Patient then suctioned and nebulizer given. Patient was stable throughout the procedure.

## 2022-01-27 NOTE — Progress Notes (Signed)
PROGRESS NOTE  Veronica Horton JKD:[id number] DOB: Oct 13, 1960 DOA: 01/14/2022 PCP: [name]  Brief History:  61 year old female with a history of COPD, tobacco abuse, chronic respiratory failure with tracheostomy on 6-8 L at home, laryngeal cancer status post tracheostomy, seizure disorder, GERD, hypertension, orthostatic hypotension presenting with generalized weakness and increasing left lower extremity redness and pain.  The patient visited the emergency department at [facility] on 12/27/2026 with lower extremity edema and pain.  The patient was discharged home from the ED with clindamycin with which she had been compliant.  She continued to have left lower extremity pain and edema.  She stated that she had her left leg against the wall approximately 1 week prior to this admission.  She denies any other recent injuries or falls.  She uses a walker at baseline.  She continues to smoke 1/2 pack/day Although the patient did not formally leave [place], she aggressively advocated for discharge on 01/09/2022. On 01/10/2022, the patient slid out of bed and went to the ED at [name].  She refused any blood work and was subsequently discharged home. She presented again on 01/14/2022 with shortness of breath with nausea and vomiting.  Chest x-ray showed increased interstitial markings with moderate left pleural effusion and small right pleural effusion.  The patient was started on Lasix.  She was also started on vancomycin and cefepime for pneumonia.  Since admission to the hospital, the patient's condition has waxed and waned without much improvement.  Her mentation also has been waxing and waning.  Palliative medicine was consulted.  Pulmonary medicine was consulted.  She was started antibiotics and lasix.  Although she remained afebrile and hemodynamically stable, her overall medical condition continued to deteriorate with decline in mental status and refusing po  intake.  [place] discussions were held with sister and boyfriend.  They abdicated further decision making to patient's son.  [name] was contacted and favors a more comfort focus approach.  Hospital stay prolonged due to inability to find accepting residential hospice facility.  After speaking with son, he was agreeable for SNF placement with hospice services via [facility].    Assessment and Plan: Acute on chronic respiratory failure with hypoxia and hypercarbia -Multifactorial including aspiration pneumonitis, COPD, fluid overload -The patient is noncompliant with her prescribed diet -Currently stable on 10 L; (She is chronically on 8 L at home) -after [place] discussion with patient and son>>goal now is to focus on comfort measure hopeful for transition to residential hospice. -will add scopolamine patch to help with secretions.    Aspiration pneumonitis (HCC) CT chest on 01/04/2022 showed Multifocal pneumonia with acute on chronic sequela of aspiration 01/16/22 Speech therapy eval>>dys 3 with thin -MRSA+ -on vanc/cefepime--had 6 days -stopped cefepime due to altered mental status --after [place] discussion with patient and son>>goal now is to focus on comfort measure hopeful for transition to residential hospice   Acute on chronic diastolic CHF (congestive heart failure) ([place]) Clinically fluid overloaded initially with pleural effusions and interstitial edema transition to IV lasix as pt intermittently refuses pills>>d/c 9/22 up to uptrending creatinine and euvolemia -01/08/22 echo--EF 65 to 70%, no WMA. Normal RV; trivial TR -after [place] discussion with patient and son>>goal now is to focus on comfort measure hopeful for transition to residential hospice   Hypokalemia -repleted -last mag level 1.9 -Focusing on comfort care and symptomatic management only. -No further bloodwork to be done with intention to  follow electrolytes trend.   Acute renal failure superimposed on stage 3b chronic kidney  disease ([place]) Baseline creatinine 1.6-1.8 Presented with serum creatinine 2.44 Now fluid overloaded>>started IV lasix initially>>stop on 9/22 -after [place] discussion with patient and son>>goal now is to focus on comfort measure hopeful for transition to residential hospice   Acute metabolic Encephalopathy -remains confused intermittently but not agitated -B12 1968 -folate 31.4 -TSH 16.36, Free T4 = 1.12 -ammonia <10 -VBG 7.48/37/76/27 -obtain UA--no pyuria -order CT brain--No significant interval change in the size of the right parietal mass with associated mass effect and edema -MR brain--3.7 cm right parietal convexity meningioma with associated vasogenic edema, similar to prior; no other acute findings   Laryngeal cancer [facility]) - Patient has expressed not interested in further evaluation or treatment for the cancer -Palliative has seen her in the outpatient setting and planning to establish care more consistently -She is DNR. -after [place] discussion with patient and son>>goal now is to focus on comfort measure hopeful for transition to residential hospice VS SNF with hospice folow up -TOC on board to assist with transition of care.   Tracheostomy status (HCC) CHRONIC RESPIRATORY FAILURE WITH HYPOXIA - ON 6-8 L AT [place] in place; adequate saturation (with some intermittent hypoxic events when patient remove inner canula)   Tobacco abuse -Tobacco cessation discussed -patient declined nicotine patch.   Class 3 obesity (HCC) -Lifestyle modification were recommended before. -Body mass index is 41.79 kg/m.  -plan is for comfort care and symptomatic management only.   Hypertension -Stable overall -pt refusing po meds initially -now focus on comfort measures -follow VS and use PRN meds for comfort.   Goals of Care Met with son at bedside and discussed GOC -we discussed patient's overall declining medical condition despite optimal therapy.  We discussed that patient is worse  than just a few weeks ago.  We discussed her overall poor prognosis in the setting of her poor baseline function, numerous significant comorbidities and overall FTT and declining health condition.  We discussed her trajectory for recurrent and frequent hospitalizations.  He stated that patient wound not have wanted to live such a poor quality of life. He agrees with DNR and wishes to pursue full comfort measure approach. Plan to consult TOC for assistance with discharge and transition of care plans. All labs and radiographic studies at this point has been discontinued..   Family Communication:  no family at bedside.   Consultants:  palliative, pulm   Code Status:  DNR   DVT Prophylaxis: apixaban     Procedures: As Listed in Progress Note Above   Antibiotics: None Cefepime 9/16>>9/22 Vanc 9/17>>9/22    Subjective: No fever, no chest pain, no nausea vomiting.  Continue to experience intermittent episode of tracheostomy dislodging per RT/RN reports.  Nursing staff also reporting significant decrease in her oral intake.  Objective: Vitals:   01/27/22 0541 01/27/22 0806 01/27/22 1415 01/27/22 1428  BP: 110/61     Pulse: 79  80 82  Resp: _0 Temp: 97.9 F (36.6 C)     TempSrc: Oral     SpO2: 93% 93% (!) 88% 94%  Weight:      Height:        Intake/Output Summary (Last 24 hours) at 01/27/2022 1532 Last data filed at 01/27/2022 0900 Gross per 24 hour  Intake 700 ml  Output --  Net 700 ml   Weight change:   Exam: General exam: Alert, awake, oriented x 3; flat  affect, following commands.  Nursing staff reporting significant decrease in her oral intake. Respiratory system: Good saturation on 10 L supplementation through tracheostomy.  Positive rhonchi and increased secretions appreciated. Cardiovascular system:Rate controlled. No rubs or gallops. Gastrointestinal system: Abdomen is obese, nondistended, soft and nontender. No organomegaly or masses felt. Normal bowel sounds  heard. Central nervous system: No focal neurological deficits. Extremities: No cyanosis or clubbing; 1+ edema appreciated bilaterally. Skin: No petechiae.  Right lower extremity bruises/hematoma unchanged. Psychiatry: Flat affect, depressed mood.  Data Reviewed: I have personally reviewed following labs and imaging studies  Basic Metabolic Panel: Recent Labs  Lab 01/21/22 0426 01/22/22 0505  NA 146* 141  K 3.2* 2.9*  CL 108 104  CO2 26 26  GLUCOSE 75 83  BUN 28* 31*  CREATININE 3.07* 3.39*  CALCIUM 8.9 9.0  MG 2.0  --    Liver Function Tests: Recent Labs  Lab 01/21/22 0426  AST 16  ALT 12  ALKPHOS 104  BILITOT 1.0  PROT 6.5  ALBUMIN 2.5*    No results for input(s): "AMMONIA" in the last 168 hours.  CBC: Recent Labs  Lab 01/21/22 0426 01/22/22 0505  WBC 5.2 6.4  HGB 9.8* 10.0*  HCT 32.6* 33.1*  MCV 96.2 95.1  PLT 336 324   CBG: Recent Labs  Lab 01/20/22 1557 01/21/22 2154  GLUCAP 77 106*   Urine analysis:    Component Value Date/Time   COLORURINE STRAW (A) 01/19/2022 1800   APPEARANCEUR HAZY (A) 01/19/2022 1800   LABSPEC 1.009 01/19/2022 1800   PHURINE 5.0 01/19/2022 1800   GLUCOSEU NEGATIVE 01/19/2022 1800   HGBUR MODERATE (A) 01/19/2022 1800   BILIRUBINUR NEGATIVE 01/19/2022 1800   KETONESUR 5 (A) 01/19/2022 1800   PROTEINUR NEGATIVE 01/19/2022 1800   UROBILINOGEN 0.2 09/25/2010 0530   NITRITE NEGATIVE 01/19/2022 1800   LEUKOCYTESUR NEGATIVE 01/19/2022 1800   Sepsis Labs:  Recent Results (from the past 240 hour(s))  Urine Culture     Status: None   Collection Time: 01/19/22  6:00 PM   Specimen: Urine, Clean Catch  Result Value Ref Range Status   Specimen Description   Final    URINE, CLEAN CATCH Performed at [facility], [address], [place], [place] [address]    Special Requests   Final    NONE Performed at [facility], [address], [place], [place] [address]    Culture   Final    NO GROWTH Performed at [facility], [place] [address] [place], [place] [address]    Report Status 01/20/2022 FINAL  Final     Scheduled Meds:  ipratropium-albuterol  3 mL Nebulization TID   levothyroxine  75 mcg Intravenous Daily   nystatin   Topical BID   scopolamine  1 patch Transdermal Q72H   Continuous Infusions:  levETIRAcetam 500 mg (01/27/22 0906)    Procedures/Studies: MR BRAIN WO CONTRAST  Result Date: 01/20/2022 CLINICAL DATA:  Initial evaluation for mental status change. EXAM: MRI HEAD WITHOUT CONTRAST TECHNIQUE: Multiplanar, multiecho pulse sequences of the brain and surrounding structures were obtained without intravenous contrast. COMPARISON:  Prior CT from 01/19/2022. FINDINGS: Brain: Examination severely degraded by motion artifact, and is nearly nondiagnostic. Cerebral volume within normal limits. No visible foci of restricted diffusion to suggest acute or subacute ischemia. No visible acute or chronic intracranial blood products. Previously identified meningioma centered at the right parietal convexity again seen, measuring up to approximately 3.7 cm in size. Associated regional mass effect with vasogenic  edema, similar to prior CT. No other visible mass lesion or mass effect. No significant midline shift. Ventricles grossly within normal limits for size without hydrocephalus. No extra-axial fluid collection. Pituitary gland suprasellar region grossly within normal limits. Vascular: Major intracranial vascular flow voids are grossly maintained at the skull base, although poorly assessed due to significant motion. Skull and upper cervical spine: Craniocervical junction within normal limits. Bone marrow signal intensity grossly normal. No scalp soft tissue abnormality. Sinuses/Orbits: Globes and orbital soft tissues grossly within normal limits. Paranasal sinuses are largely clear. No obvious significant mastoid effusion. Other: None. IMPRESSION: 1. Severely motion degraded and nearly nondiagnostic examination.  2. 3.7 cm right parietal convexity meningioma with associated vasogenic edema, similar to prior. 3. No other definite acute intracranial abnormality. Electronically Signed   By: Jeannine Boga M.D.   On: 01/20/2022 23:02   CT HEAD WO CONTRAST (5MM)  Result Date: 01/19/2022 CLINICAL DATA:  Altered mental status. EXAM: CT HEAD WITHOUT CONTRAST TECHNIQUE: Contiguous axial images were obtained from the base of the skull through the vertex without intravenous contrast. RADIATION DOSE REDUCTION: This exam was performed according to the departmental dose-optimization program which includes automated exposure control, adjustment of the mA and/or kV according to patient size and/or use of iterative reconstruction technique. COMPARISON:  Head CT dated 11/28/2020. FINDINGS: Evaluation of this exam is limited due to motion artifact. 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 This is relatively unchanged compared to prior CT. There is associated mass effect and edema in the right parietal lobe also similar to prior CT. There is no acute intracranial hemorrhage. No midline shift. No extra-axial fluid collection. Vascular: No hyperdense vessel or unexpected calcification. Skull: Normal. Negative for fracture or focal lesion. Sinuses/Orbits: No acute finding. Other: None IMPRESSION: 1. No acute intracranial hemorrhage. 2. No significant interval change in the size of the right parietal mass with associated mass effect and edema. Electronically Signed   By: Anner Crete M.D.   On: 01/19/2022 18:31   DG CHEST PORT 1 VIEW  Result Date: 01/18/2022 CLINICAL DATA:  888[address].  Shortness of breath. EXAM: PORTABLE CHEST 1 VIEW COMPARISON:  Portable 01/16/2022 at 8:37 a.m. FINDINGS: 4:56 a.m. An oxygen mask superimposes over the left apical area. There is a tracheostomy cannula with the tip 5.4 cm from the  carina. There is a low inspiration. There is patchy consolidation, atelectasis or combination in the lower lung fields overlying small pleural effusions. The upper lung fields remain clear. No new or worsened opacity is seen. There is mild cardiomegaly without CHF. Stable mediastinum with mild aortic atherosclerosis. Degenerative change thoracic spine and asymmetrically of the right shoulder. IMPRESSION: There previously was interstitial edema which is not seen today. Bibasilar opacities, atelectasis and/or pneumonia, and small underlying pleural effusions appear similar. Overall aeration is unchanged. Electronically Signed   By: Telford Nab M.D.   On: 01/18/2022 06:02   DG Chest Port 1 View  Result Date: 01/16/2022 CLINICAL DATA:  Respiratory distress EXAM: PORTABLE CHEST 1 VIEW COMPARISON:  Chest radiograph 01/15/2019 FINDINGS: Increased bilateral pleural effusions, left-greater-than-right. Right basilar linear and left basilar consolidative opacities may represent atelectasis, but superimposed infection is difficult to exclude. Increased interstitial opacities and peribronchial wall thickening are suggestive of mild pulmonary edema. There is a tracheostomy in place. Visualized upper abdomen is unremarkable. No acute osseous abnormality. IMPRESSION: New mild pulmonary edema with moderate left  and small right pleural effusions. Bibasilar opacities may represent atelectasis in the setting of worsening pleural effusions, but a component of superimposed infection is difficult to exclude. Electronically Signed   By: Marin Roberts M.D.   On: 01/16/2022 09:01   DG Chest Port 1 View  Result Date: 01/14/2022 CLINICAL DATA:  sob EXAM: PORTABLE CHEST 1 VIEW.  Patient is rotated. COMPARISON:  Chest x-ray 01/03/2022, CT chest 01/04/2022 FINDINGS: Tracheostomy tube with tip terminating 8.5 cm above the carina. The heart and mediastinal contours are grossly unchanged. Low lung volumes persistent retrocardiac airspace  opacity. No pulmonary edema. Similar-appearing blunting of bilateral costophrenic angles with no significant pleural effusion. No pneumothorax. No acute osseous abnormality. IMPRESSION: 1. Low lung volumes, persistent retrocardiac airspace opacity with limited evaluation due to patient rotation. 2. Similar-appearing blunting of bilateral costophrenic angles with no significant pleural effusion. Electronically Signed   By: Iven Finn M.D.   On: 01/14/2022 19:49   ECHOCARDIOGRAM COMPLETE  Result Date: 01/07/2022    ECHOCARDIOGRAM REPORT   Patient Name:   ALBIRTHA GRINAGE Date of Exam: 01/07/2022 Medical Rec #:  [id number]       Height:       65.5 in Accession #:    [id number]      Weight:       255.0 lb Date of Birth:  [birth year]       BSA:          2.206 m Patient Age:    54 years        BP:           99/48 mmHg Patient Gender: F               HR:           72 bpm. Exam Location:  [facility] Procedure: 2D Echo, Color Doppler, Cardiac Doppler and Intracardiac            Opacification Agent Indications:    T34.28 Acute diastolic (congestive) heart failure  History:        Patient has prior history of Echocardiogram examinations, most                 recent 04/19/2021. CHF, Arrythmias:Atrial Fibrillation; Risk                 Factors:Hypertension, Diabetes, Dyslipidemia and Current Smoker.  Sonographer:    Raquel Sarna Senior RDCS Referring Phys: [phone] DAVID TAT  Sonographer Comments: Technically difficult due to patient body habitus. Patient is morbidly obese. IMPRESSIONS  1. Left ventricular ejection fraction, by estimation, is 65 to 70%. The left ventricle has normal function. The left ventricle has no regional wall motion abnormalities. Left ventricular diastolic parameters were normal.  2. Right ventricular systolic function is normal. The right ventricular size is normal. Tricuspid regurgitation signal is inadequate for assessing PA pressure.  3. The mitral valve is normal in structure. No evidence of mitral valve  regurgitation. No evidence of mitral stenosis.  4. The aortic valve was not well visualized. Aortic valve regurgitation is not visualized. No aortic stenosis is present.  5. The inferior vena cava is dilated in size with <50% respiratory variability, suggesting right atrial pressure of 15 mmHg. FINDINGS  Left Ventricle: Left ventricular ejection fraction, by estimation, is 65 to 70%. The left ventricle has normal function. The left ventricle has no regional wall motion abnormalities. Definity contrast agent was given IV to delineate the left ventricular  endocardial borders. The left ventricular internal cavity  size was normal in size. There is no left ventricular hypertrophy. Left ventricular diastolic parameters were normal. Right Ventricle: The right ventricular size is normal. Right vetricular wall thickness was not well visualized. Right ventricular systolic function is normal. Tricuspid regurgitation signal is inadequate for assessing PA pressure. Left Atrium: Left atrial size was normal in size. Right Atrium: Right atrial size was normal in size. Pericardium: There is no evidence of pericardial effusion. Mitral Valve: The mitral valve is normal in structure. No evidence of mitral valve regurgitation. No evidence of mitral valve stenosis. Tricuspid Valve: The tricuspid valve is not well visualized. Tricuspid valve regurgitation is trivial. No evidence of tricuspid stenosis. Aortic Valve: The aortic valve was not well visualized. Aortic valve regurgitation is not visualized. No aortic stenosis is present. Aortic valve mean gradient measures 4.0 mmHg. Aortic valve peak gradient measures 9.3 mmHg. Aortic valve area, by VTI measures 2.33 cm. Pulmonic Valve: The pulmonic valve was not well visualized. Pulmonic valve regurgitation is not visualized. No evidence of pulmonic stenosis. Aorta: The aortic root is normal in size and structure. Venous: The inferior vena cava is dilated in size with less than 50%  respiratory variability, suggesting right atrial pressure of 15 mmHg. IAS/Shunts: The interatrial septum was not well visualized.  LEFT VENTRICLE PLAX 2D LVIDd:         4.60 cm LVIDs:         2.50 cm LV PW:         0.80 cm LV IVS:        1.00 cm LVOT diam:     1.80 cm LV SV:         67 LV SV Index:   30 LVOT Area:     2.54 cm  RIGHT VENTRICLE RV S prime:     13.30 cm/s TAPSE (M-mode): 2.0 cm LEFT ATRIUM             Index        RIGHT ATRIUM           Index LA diam:        3.60 cm 1.63 cm/m   RA Area:     20.30 cm LA Vol (A2C):   45.4 ml 20.58 ml/m  RA Volume:   64.40 ml  29.20 ml/m LA Vol (A4C):   63.1 ml 28.61 ml/m LA Biplane Vol: 54.0 ml 24.48 ml/m  AORTIC VALVE AV Area (Vmax):    2.06 cm AV Area (Vmean):   2.15 cm AV Area (VTI):     2.33 cm AV Vmax:           152.23 cm/s AV Vmean:          92.193 cm/s AV VTI:            0.289 m AV Peak Grad:      9.3 mmHg AV Mean Grad:      4.0 mmHg LVOT Vmax:         123.00 cm/s LVOT Vmean:        78.000 cm/s LVOT VTI:          0.264 m LVOT/AV VTI ratio: 0.91  AORTA Ao Root diam: 3.00 cm Ao Asc diam:  2.90 cm  SHUNTS Systemic VTI:  0.26 m Systemic Diam: 1.80 cm Carlyle Dolly MD Electronically signed by Carlyle Dolly MD Signature Date/Time: 01/07/2022/3:23:07 PM    Final    CT CHEST WO CONTRAST  Result Date: 01/04/2022 CLINICAL DATA:  A 61 year old female with chronic dyspnea presents  for evaluation in the setting of abnormal chest radiograph. EXAM: CT CHEST WITHOUT CONTRAST TECHNIQUE: Multidetector CT imaging of the chest was performed following the standard protocol without IV contrast. RADIATION DOSE REDUCTION: This exam was performed according to the departmental dose-optimization program which includes automated exposure control, adjustment of the mA and/or kV according to patient size and/or use of iterative reconstruction technique. COMPARISON:  December of 2021 FINDINGS: Cardiovascular: Scattered aortic atherosclerosis. Three-vessel coronary artery disease.  Normal heart size without pericardial effusion or nodularity. Aberrant RIGHT subclavian artery. Mild engorgement of central pulmonary vasculature. Limited assessment of cardiovascular structures given lack of intravenous contrast. Mediastinum/Nodes: Tracheostomy tube terminating in the proximal trachea. No signs of adenopathy in the chest. No acute process in the mediastinum. Esophagus is grossly normal. Lungs/Pleura: Patchy airspace disease in the chest in LEFT upper lobe, lingula, LEFT lower lobe and RIGHT lung base. Volume loss in the LEFT lung base associated also with consolidative changes and material in bronchial structures. No pneumothorax. Trace pleural effusions. Airways are otherwise patent. Upper Abdomen: Incidental imaging of upper abdominal contents without acute process to the extent evaluated. Musculoskeletal: Spinal degenerative changes. No acute or destructive bone process. Rib fractures of varying ages, no acute fractures most are chronic or subacute and without displacement. Fracture at the T9 level with 20-30% loss of height. Vacuum phenomenon within the fracture site. No surrounding stranding. T11 with loss of height as well. These have occurred since December of 2021. No listhesis. IMPRESSION: 1. Multifocal pneumonia with acute on chronic sequela of aspiration favored at the LEFT lung base. 2. Age-indeterminate compression fractures potentially subacute/recent at the T9 level and T11 level are new since 2021. Correlate with any history of prior fracture to these areas and with any current symptoms. 3. Aortic atherosclerosis and coronary artery disease. Aortic Atherosclerosis (ICD10-I70.0). Electronically Signed   By: Zetta Bills M.D.   On: 01/04/2022 17:23   DG Chest Portable 1 View  Result Date: 01/03/2022 CLINICAL DATA:  Provided history: Shortness of breath. EXAM: PORTABLE CHEST 1 VIEW COMPARISON:  Prior chest radiographs 08/08/2021 and earlier. FINDINGS: A tracheostomy tube is noted.  Cardiomegaly. Central pulmonary vascular congestion. Prominence of the interstitial lung markings bilaterally, compatible with pulmonary interstitial edema. Irregular and nodular opacities within the right lung base. The nodular opacities measure up to 16 mm. Irregular opacities within the left lung base. No evidence of pneumothorax. No acute bony abnormality identified. IMPRESSION: Cardiomegaly with pulmonary interstitial edema. Opacities within bilateral lung bases, which may reflect atelectasis and/or pneumonia. Superimposed more nodular opacities within the right lung base. A chest CT is recommended to better assess for possible pulmonary nodules at this site. Small left pleural effusion, which may be partially loculated. Electronically Signed   By: Kellie Simmering D.O.   On: 01/03/2022 14:50   US Venous Img Lower Bilateral  Result Date: 01/03/2022 CLINICAL DATA:  Bilateral open wounds. EXAM: BILATERAL LOWER EXTREMITY VENOUS DOPPLER ULTRASOUND TECHNIQUE: Gray-scale sonography with graded compression, as well as color Doppler and duplex ultrasound were performed to evaluate the lower extremity deep venous systems from the level of the common femoral vein and including the common femoral, femoral, profunda femoral, popliteal and calf veins including the posterior tibial, peroneal and gastrocnemius veins when visible. The superficial great saphenous vein was also interrogated. Spectral Doppler was utilized to evaluate flow at rest and with distal augmentation maneuvers in the common femoral, femoral and popliteal veins. COMPARISON:  None Available. FINDINGS: RIGHT LOWER EXTREMITY Common Femoral Vein: No evidence  of thrombus. Normal compressibility, respiratory phasicity and response to augmentation. Saphenofemoral Junction: No evidence of thrombus. Normal compressibility and flow on color Doppler imaging. Profunda Femoral Vein: No evidence of thrombus. Normal compressibility and flow on color Doppler imaging.  Femoral Vein: No evidence of thrombus. Normal compressibility, respiratory phasicity and response to augmentation. Popliteal Vein: No evidence of thrombus. Normal compressibility, respiratory phasicity and response to augmentation. Calf Veins: Limited evaluation. Superficial Great Saphenous Vein: Limited evaluation. Other Findings:  Large amount of subcutaneous edema. LEFT LOWER EXTREMITY Common Femoral Vein: No evidence of thrombus. Normal compressibility, respiratory phasicity and response to augmentation. Saphenofemoral Junction: No evidence of thrombus. Normal compressibility and flow on color Doppler imaging. Profunda Femoral Vein: No evidence of thrombus. Normal compressibility and flow on color Doppler imaging. Femoral Vein: No evidence of thrombus. Normal compressibility, respiratory phasicity and response to augmentation. Popliteal Vein: No evidence of thrombus. Normal compressibility, respiratory phasicity and response to augmentation. Calf Veins: Limited evaluation. Superficial Great Saphenous Vein: No evidence of thrombus. Normal compressibility. Other Findings:  Subcutaneous edema. IMPRESSION: No evidence of deep venous thrombosis in either lower extremity. However, the study has technical limitations due to body habitus and edema. Limited evaluation the bilateral deep calf veins. Electronically Signed   By: Markus Daft M.D.   On: 01/03/2022 13:45    [name], MD  [facility]  If 7PM-7AM, please contact night-coverage [email or web address] Password [facility] 01/27/2022, 3:32 PM   LOS: 13 days

## 2022-01-28 DIAGNOSIS — G9341 Metabolic encephalopathy: Secondary | ICD-10-CM | POA: Diagnosis not present

## 2022-01-28 DIAGNOSIS — R1310 Dysphagia, unspecified: Secondary | ICD-10-CM | POA: Diagnosis not present

## 2022-01-28 DIAGNOSIS — T17908D Unspecified foreign body in respiratory tract, part unspecified causing other injury, subsequent encounter: Secondary | ICD-10-CM | POA: Diagnosis not present

## 2022-01-28 DIAGNOSIS — J690 Pneumonitis due to inhalation of food and vomit: Secondary | ICD-10-CM | POA: Diagnosis not present

## 2022-01-28 NOTE — Progress Notes (Signed)
Patient has been stable this shift and able to make her needs known.  Patient has been suctioned by respiratory. Patient has been comfortable this shift.

## 2022-01-28 NOTE — Progress Notes (Signed)
PROGRESS NOTE  Veronica Horton JKD:[id number] DOB: Oct 13, 1960 DOA: 01/14/2022 PCP: [name]  Brief History:  61 year old female with a history of COPD, tobacco abuse, chronic respiratory failure with tracheostomy on 6-8 L at home, laryngeal cancer status post tracheostomy, seizure disorder, GERD, hypertension, orthostatic hypotension presenting with generalized weakness and increasing left lower extremity redness and pain.  The patient visited the emergency department at [facility] on 12/27/2026 with lower extremity edema and pain.  The patient was discharged home from the ED with clindamycin with which she had been compliant.  She continued to have left lower extremity pain and edema.  She stated that she had her left leg against the wall approximately 1 week prior to this admission.  She denies any other recent injuries or falls.  She uses a walker at baseline.  She continues to smoke 1/2 pack/day Although the patient did not formally leave [place], she aggressively advocated for discharge on 01/09/2022. On 01/10/2022, the patient slid out of bed and went to the ED at [facility].  She refused any blood work and was subsequently discharged home. She presented again on 01/14/2022 with shortness of breath with nausea and vomiting.  Chest x-ray showed increased interstitial markings with moderate left pleural effusion and small right pleural effusion.  The patient was started on Lasix.  She was also started on vancomycin and cefepime for pneumonia.  Since admission to the hospital, the patient's condition has waxed and waned without much improvement.  Her mentation also has been waxing and waning.  Palliative medicine was consulted.  Pulmonary medicine was consulted.  She was started antibiotics and lasix.  Although she remained afebrile and hemodynamically stable, her overall medical condition continued to deteriorate with decline in mental status and refusing po  intake.  [place] discussions were held with sister and boyfriend.  They abdicated further decision making to patient's son.  [name] was contacted and favors a more comfort focus approach.  Hospital stay prolonged due to inability to find accepting residential hospice facility.  After speaking with son, he was agreeable for SNF placement with hospice services via [facility].    Assessment and Plan: Acute on chronic respiratory failure with hypoxia and hypercarbia -Multifactorial including aspiration pneumonitis, COPD, fluid overload -The patient is noncompliant with her prescribed diet -Currently stable on 10 L; (She is chronically on 8 L at home) -after [place] discussion with patient and son>>goal now is to focus on comfort measure hopeful for transition to residential hospice. -will add scopolamine patch to help with secretions.    Aspiration pneumonitis (HCC) CT chest on 01/04/2022 showed Multifocal pneumonia with acute on chronic sequela of aspiration 01/16/22 Speech therapy eval>>dys 3 with thin -MRSA+ -on vanc/cefepime--had 6 days -stopped cefepime due to altered mental status --after [place] discussion with patient and son>>goal now is to focus on comfort measure hopeful for transition to residential hospice   Acute on chronic diastolic CHF (congestive heart failure) ([place]) Clinically fluid overloaded initially with pleural effusions and interstitial edema transition to IV lasix as pt intermittently refuses pills>>d/c 9/22 up to uptrending creatinine and euvolemia -01/08/22 echo--EF 65 to 70%, no WMA. Normal RV; trivial TR -after [place] discussion with patient and son>>goal now is to focus on comfort measure hopeful for transition to residential hospice   Hypokalemia -repleted -last mag level 1.9 -Focusing on comfort care and symptomatic management only. -No further bloodwork to be done with intention to  follow electrolytes trend.   Acute renal failure superimposed on stage 3b chronic kidney  disease ([place]) Baseline creatinine 1.6-1.8 Presented with serum creatinine 2.44 Now fluid overloaded>>started IV lasix initially>>stop on 9/22 -after [place] discussion with patient and son>>goal now is to focus on comfort measure hopeful for transition to residential hospice. -No further blood work anticipated; continue symptomatic management only.   Acute metabolic Encephalopathy -remains confused intermittently but not agitated -B12 1968 -folate 31.4 -TSH 16.36, Free T4 = 1.12 -ammonia <10 -VBG 7.48/37/76/27 -obtain UA--no pyuria -order CT brain--No significant interval change in the size of the right parietal mass with associated mass effect and edema -MR brain--3.7 cm right parietal convexity meningioma with associated vasogenic edema, similar to prior; no other acute findings. -Continue supportive care and symptomatic management.   Laryngeal cancer [facility]) - Patient has expressed not interested in further evaluation or treatment for the cancer -Palliative has seen her in the outpatient setting and planning to establish care more consistently -She is DNR. -after [place] discussion with patient and son>>goal now is to focus on comfort measure hopeful for transition to residential hospice VS SNF with hospice folow up -TOC on board to assist with transition of care. -Still looking for a place that can take her to further provide comfort care and management.   Tracheostomy status (HCC) CHRONIC RESPIRATORY FAILURE WITH HYPOXIA - ON 6-8 L AT [place] in place; adequate saturation (with some intermittent hypoxic events when patient remove inner canula)   Tobacco abuse -Tobacco cessation discussed -patient declined nicotine patch.   Class 3 obesity (HCC) -Lifestyle modification were recommended before. -Body mass index is 41.79 kg/m.  -plan is for comfort care and symptomatic management only.   Hypertension -Stable overall -pt refusing po meds initially -now focus on comfort  measures -follow VS and use PRN meds for comfort.   Goals of Care Met with son at bedside and discussed GOC -we discussed patient's overall declining medical condition despite optimal therapy.  We discussed that patient is worse than just a few weeks ago.  We discussed her overall poor prognosis in the setting of her poor baseline function, numerous significant comorbidities and overall FTT and declining health condition.  We discussed her trajectory for recurrent and frequent hospitalizations.  He stated that patient wound not have wanted to live such a poor quality of life. He agrees with DNR and wishes to pursue full comfort measure approach. Plan to consult TOC for assistance with discharge and transition of care plans. All labs and radiographic studies at this point has been discontinued..   Family Communication:  no family at bedside.   Consultants:  palliative, pulm   Code Status:  DNR   DVT Prophylaxis: apixaban     Procedures: As Listed in Progress Note Above   Antibiotics: None Cefepime 9/16>>9/22 Vanc 9/17>>9/22    Subjective: Continues to have decreasing her oral intake; no overnight events.  Patient denies chest pain, nausea and vomiting.  Appears to be comfortable currently.  Objective: Vitals:   01/27/22 1428 01/27/22 1633 01/27/22 1953 01/28/22 0457  BP:    109/64  Pulse: 82   79  Resp: $Remo'18 16  14  'CrMgc$ Temp:    97.7 F (36.5 C)  TempSrc:    Oral  SpO2: 94% 94% 93% 91%  Weight:      Height:       No intake or output data in the 24 hours ending 01/28/22 0904  Weight change:   Exam: General exam: Alert, awake,  oriented x 3; comfortable currently; no overnight events.  Continues to receive intermittent suctioning.  Nursing staff reporting decreased oral intake.  No fever, no chest pain, no nausea, no vomiting. Respiratory system: Diffuse rhonchi bilaterally; still with appreciated increase upper airway secretions. Cardiovascular system: No rubs, no gallops,  unable to properly assess JVD with body habitus. Gastrointestinal system: Abdomen is obese, nondistended, soft and nontender. No organomegaly or masses felt. Normal bowel sounds heard. Central nervous system: Alert and oriented. No focal neurological deficits. Extremities: No cyanosis or clubbing. Skin: Unchanged right lower extremity posterior aspect bruises/hematoma; no petechiae. Psychiatry: Flat affect; depressed mood appreciated on exam.  Data Reviewed: I have personally reviewed following labs and imaging studies  Basic Metabolic Panel: Recent Labs  Lab 01/22/22 0505  NA 141  K 2.9*  CL 104  CO2 26  GLUCOSE 83  BUN 31*  CREATININE 3.39*  CALCIUM 9.0   Liver Function Tests: No results for input(s): "AST", "ALT", "ALKPHOS", "BILITOT", "PROT", "ALBUMIN" in the last 168 hours.   No results for input(s): "AMMONIA" in the last 168 hours.  CBC: Recent Labs  Lab 01/22/22 0505  WBC 6.4  HGB 10.0*  HCT 33.1*  MCV 95.1  PLT 324   CBG: Recent Labs  Lab 01/21/22 2154  GLUCAP 106*   Urine analysis:    Component Value Date/Time   COLORURINE STRAW (A) 01/19/2022 1800   APPEARANCEUR HAZY (A) 01/19/2022 1800   LABSPEC 1.009 01/19/2022 1800   PHURINE 5.0 01/19/2022 1800   GLUCOSEU NEGATIVE 01/19/2022 1800   HGBUR MODERATE (A) 01/19/2022 1800   BILIRUBINUR NEGATIVE 01/19/2022 1800   KETONESUR 5 (A) 01/19/2022 1800   PROTEINUR NEGATIVE 01/19/2022 1800   UROBILINOGEN 0.2 09/25/2010 0530   NITRITE NEGATIVE 01/19/2022 1800   LEUKOCYTESUR NEGATIVE 01/19/2022 1800   Sepsis Labs:  Recent Results (from the past 240 hour(s))  Urine Culture     Status: None   Collection Time: 01/19/22  6:00 PM   Specimen: Urine, Clean Catch  Result Value Ref Range Status   Specimen Description   Final    URINE, CLEAN CATCH Performed at [facility], [address] [place], [place] [address]    Special Requests   Final    NONE Performed at [facility], [address],  [place], [place] [address]    Culture   Final    NO GROWTH Performed at [facility], [place] [address], [place], [place] [address]    Report Status 01/20/2022 FINAL  Final     Scheduled Meds:  ipratropium-albuterol  3 mL Nebulization TID   levothyroxine  75 mcg Intravenous Daily   nystatin   Topical BID   scopolamine  1 patch Transdermal Q72H   Continuous Infusions:  levETIRAcetam 500 mg (01/27/22 2157)    Procedures/Studies: MR BRAIN WO CONTRAST  Result Date: 01/20/2022 CLINICAL DATA:  Initial evaluation for mental status change. EXAM: MRI HEAD WITHOUT CONTRAST TECHNIQUE: Multiplanar, multiecho pulse sequences of the brain and surrounding structures were obtained without intravenous contrast. COMPARISON:  Prior CT from 01/19/2022. FINDINGS: Brain: Examination severely degraded by motion artifact, and is nearly nondiagnostic. Cerebral volume within normal limits. No visible foci of restricted diffusion to suggest acute or subacute ischemia. No visible acute or chronic intracranial blood products. Previously identified meningioma centered at the right parietal convexity again seen, measuring up to approximately 3.7 cm in size. Associated regional mass effect with vasogenic edema, similar to prior CT. No other visible mass lesion or mass effect.  No significant midline shift. Ventricles grossly within normal limits for size without hydrocephalus. No extra-axial fluid collection. Pituitary gland suprasellar region grossly within normal limits. Vascular: Major intracranial vascular flow voids are grossly maintained at the skull base, although poorly assessed due to significant motion. Skull and upper cervical spine: Craniocervical junction within normal limits. Bone marrow signal intensity grossly normal. No scalp soft tissue abnormality. Sinuses/Orbits: Globes and orbital soft tissues grossly within normal limits. Paranasal sinuses are largely clear. No obvious significant mastoid effusion. Other:  None. IMPRESSION: 1. Severely motion degraded and nearly nondiagnostic examination. 2. 3.7 cm right parietal convexity meningioma with associated vasogenic edema, similar to prior. 3. No other definite acute intracranial abnormality. Electronically Signed   By: Jeannine Boga M.D.   On: 01/20/2022 23:02   CT HEAD WO CONTRAST (5MM)  Result Date: 01/19/2022 CLINICAL DATA:  Altered mental status. EXAM: CT HEAD WITHOUT CONTRAST TECHNIQUE: Contiguous axial images were obtained from the base of the skull through the vertex without intravenous contrast. RADIATION DOSE REDUCTION: This exam was performed according to the departmental dose-optimization program which includes automated exposure control, adjustment of the mA and/or kV according to patient size and/or use of iterative reconstruction technique. COMPARISON:  Head CT dated 11/28/2020. FINDINGS: Evaluation of this exam is limited due to motion artifact. 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 This is relatively unchanged compared to prior CT. There is associated mass effect and edema in the right parietal lobe also similar to prior CT. There is no acute intracranial hemorrhage. No midline shift. No extra-axial fluid collection. Vascular: No hyperdense vessel or unexpected calcification. Skull: Normal. Negative for fracture or focal lesion. Sinuses/Orbits: No acute finding. Other: None IMPRESSION: 1. No acute intracranial hemorrhage. 2. No significant interval change in the size of the right parietal mass with associated mass effect and edema. Electronically Signed   By: Anner Crete M.D.   On: 01/19/2022 18:31   DG CHEST PORT 1 VIEW  Result Date: 01/18/2022 CLINICAL DATA:  [address].  Shortness of breath. EXAM: PORTABLE CHEST 1 VIEW COMPARISON:  Portable 01/16/2022 at 8:37 a.m. FINDINGS: 4:56 a.m. An oxygen mask superimposes over  the left apical area. There is a tracheostomy cannula with the tip 5.4 cm from the carina. There is a low inspiration. There is patchy consolidation, atelectasis or combination in the lower lung fields overlying small pleural effusions. The upper lung fields remain clear. No new or worsened opacity is seen. There is mild cardiomegaly without CHF. Stable mediastinum with mild aortic atherosclerosis. Degenerative change thoracic spine and asymmetrically of the right shoulder. IMPRESSION: There previously was interstitial edema which is not seen today. Bibasilar opacities, atelectasis and/or pneumonia, and small underlying pleural effusions appear similar. Overall aeration is unchanged. Electronically Signed   By: Telford Nab M.D.   On: 01/18/2022 06:02   DG Chest Port 1 View  Result Date: 01/16/2022 CLINICAL DATA:  Respiratory distress EXAM: PORTABLE CHEST 1 VIEW COMPARISON:  Chest radiograph 01/15/2019 FINDINGS: Increased bilateral pleural effusions, left-greater-than-right. Right basilar linear and left basilar consolidative opacities may represent atelectasis, but superimposed infection is difficult to exclude. Increased interstitial opacities and peribronchial wall thickening are suggestive of mild pulmonary edema. There is a tracheostomy in place. Visualized upper abdomen is unremarkable. No acute osseous abnormality. IMPRESSION: New mild pulmonary edema with moderate left and small right pleural effusions. Bibasilar opacities may represent atelectasis in the setting  of worsening pleural effusions, but a component of superimposed infection is difficult to exclude. Electronically Signed   By: Marin Roberts M.D.   On: 01/16/2022 09:01   DG Chest Port 1 View  Result Date: 01/14/2022 CLINICAL DATA:  sob EXAM: PORTABLE CHEST 1 VIEW.  Patient is rotated. COMPARISON:  Chest x-ray 01/03/2022, CT chest 01/04/2022 FINDINGS: Tracheostomy tube with tip terminating 8.5 cm above the carina. The heart and mediastinal  contours are grossly unchanged. Low lung volumes persistent retrocardiac airspace opacity. No pulmonary edema. Similar-appearing blunting of bilateral costophrenic angles with no significant pleural effusion. No pneumothorax. No acute osseous abnormality. IMPRESSION: 1. Low lung volumes, persistent retrocardiac airspace opacity with limited evaluation due to patient rotation. 2. Similar-appearing blunting of bilateral costophrenic angles with no significant pleural effusion. Electronically Signed   By: Iven Finn M.D.   On: 01/14/2022 19:49   ECHOCARDIOGRAM COMPLETE  Result Date: 01/07/2022    ECHOCARDIOGRAM REPORT   Patient Name:   NYEMAH WATTON Date of Exam: 01/07/2022 Medical Rec #:  [id number]       Height:       65.5 in Accession #:    [id number]      Weight:       255.0 lb Date of Birth:  01-05-1961       BSA:          2.206 m Patient Age:    39 years        BP:           99/48 mmHg Patient Gender: F               HR:           72 bpm. Exam Location:  [facility] Procedure: 2D Echo, Color Doppler, Cardiac Doppler and Intracardiac            Opacification Agent Indications:    E72.09 Acute diastolic (congestive) heart failure  History:        Patient has prior history of Echocardiogram examinations, most                 recent 04/19/2021. CHF, Arrythmias:Atrial Fibrillation; Risk                 Factors:Hypertension, Diabetes, Dyslipidemia and Current Smoker.  Sonographer:    Raquel Sarna Senior RDCS Referring Phys: [phone] DAVID TAT  Sonographer Comments: Technically difficult due to patient body habitus. Patient is morbidly obese. IMPRESSIONS  1. Left ventricular ejection fraction, by estimation, is 65 to 70%. The left ventricle has normal function. The left ventricle has no regional wall motion abnormalities. Left ventricular diastolic parameters were normal.  2. Right ventricular systolic function is normal. The right ventricular size is normal. Tricuspid regurgitation signal is inadequate for assessing PA  pressure.  3. The mitral valve is normal in structure. No evidence of mitral valve regurgitation. No evidence of mitral stenosis.  4. The aortic valve was not well visualized. Aortic valve regurgitation is not visualized. No aortic stenosis is present.  5. The inferior vena cava is dilated in size with <50% respiratory variability, suggesting right atrial pressure of 15 mmHg. FINDINGS  Left Ventricle: Left ventricular ejection fraction, by estimation, is 65 to 70%. The left ventricle has normal function. The left ventricle has no regional wall motion abnormalities. Definity contrast agent was given IV to delineate the left ventricular  endocardial borders. The left ventricular internal cavity size was normal in size. There is no left ventricular hypertrophy. Left ventricular  diastolic parameters were normal. Right Ventricle: The right ventricular size is normal. Right vetricular wall thickness was not well visualized. Right ventricular systolic function is normal. Tricuspid regurgitation signal is inadequate for assessing PA pressure. Left Atrium: Left atrial size was normal in size. Right Atrium: Right atrial size was normal in size. Pericardium: There is no evidence of pericardial effusion. Mitral Valve: The mitral valve is normal in structure. No evidence of mitral valve regurgitation. No evidence of mitral valve stenosis. Tricuspid Valve: The tricuspid valve is not well visualized. Tricuspid valve regurgitation is trivial. No evidence of tricuspid stenosis. Aortic Valve: The aortic valve was not well visualized. Aortic valve regurgitation is not visualized. No aortic stenosis is present. Aortic valve mean gradient measures 4.0 mmHg. Aortic valve peak gradient measures 9.3 mmHg. Aortic valve area, by VTI measures 2.33 cm. Pulmonic Valve: The pulmonic valve was not well visualized. Pulmonic valve regurgitation is not visualized. No evidence of pulmonic stenosis. Aorta: The aortic root is normal in size and  structure. Venous: The inferior vena cava is dilated in size with less than 50% respiratory variability, suggesting right atrial pressure of 15 mmHg. IAS/Shunts: The interatrial septum was not well visualized.  LEFT VENTRICLE PLAX 2D LVIDd:         4.60 cm LVIDs:         2.50 cm LV PW:         0.80 cm LV IVS:        1.00 cm LVOT diam:     1.80 cm LV SV:         67 LV SV Index:   30 LVOT Area:     2.54 cm  RIGHT VENTRICLE RV S prime:     13.30 cm/s TAPSE (M-mode): 2.0 cm LEFT ATRIUM             Index        RIGHT ATRIUM           Index LA diam:        3.60 cm 1.63 cm/m   RA Area:     20.30 cm LA Vol (A2C):   45.4 ml 20.58 ml/m  RA Volume:   64.40 ml  29.20 ml/m LA Vol (A4C):   63.1 ml 28.61 ml/m LA Biplane Vol: 54.0 ml 24.48 ml/m  AORTIC VALVE AV Area (Vmax):    2.06 cm AV Area (Vmean):   2.15 cm AV Area (VTI):     2.33 cm AV Vmax:           152.23 cm/s AV Vmean:          92.193 cm/s AV VTI:            0.289 m AV Peak Grad:      9.3 mmHg AV Mean Grad:      4.0 mmHg LVOT Vmax:         123.00 cm/s LVOT Vmean:        78.000 cm/s LVOT VTI:          0.264 m LVOT/AV VTI ratio: 0.91  AORTA Ao Root diam: 3.00 cm Ao Asc diam:  2.90 cm  SHUNTS Systemic VTI:  0.26 m Systemic Diam: 1.80 cm Carlyle Dolly MD Electronically signed by Carlyle Dolly MD Signature Date/Time: 01/07/2022/3:23:07 PM    Final    CT CHEST WO CONTRAST  Result Date: 01/04/2022 CLINICAL DATA:  A 61 year old female with chronic dyspnea presents for evaluation in the setting of abnormal chest radiograph. EXAM: CT CHEST WITHOUT  CONTRAST TECHNIQUE: Multidetector CT imaging of the chest was performed following the standard protocol without IV contrast. RADIATION DOSE REDUCTION: This exam was performed according to the departmental dose-optimization program which includes automated exposure control, adjustment of the mA and/or kV according to patient size and/or use of iterative reconstruction technique. COMPARISON:  December of 2021 FINDINGS:  Cardiovascular: Scattered aortic atherosclerosis. Three-vessel coronary artery disease. Normal heart size without pericardial effusion or nodularity. Aberrant RIGHT subclavian artery. Mild engorgement of central pulmonary vasculature. Limited assessment of cardiovascular structures given lack of intravenous contrast. Mediastinum/Nodes: Tracheostomy tube terminating in the proximal trachea. No signs of adenopathy in the chest. No acute process in the mediastinum. Esophagus is grossly normal. Lungs/Pleura: Patchy airspace disease in the chest in LEFT upper lobe, lingula, LEFT lower lobe and RIGHT lung base. Volume loss in the LEFT lung base associated also with consolidative changes and material in bronchial structures. No pneumothorax. Trace pleural effusions. Airways are otherwise patent. Upper Abdomen: Incidental imaging of upper abdominal contents without acute process to the extent evaluated. Musculoskeletal: Spinal degenerative changes. No acute or destructive bone process. Rib fractures of varying ages, no acute fractures most are chronic or subacute and without displacement. Fracture at the T9 level with 20-30% loss of height. Vacuum phenomenon within the fracture site. No surrounding stranding. T11 with loss of height as well. These have occurred since December of 2021. No listhesis. IMPRESSION: 1. Multifocal pneumonia with acute on chronic sequela of aspiration favored at the LEFT lung base. 2. Age-indeterminate compression fractures potentially subacute/recent at the T9 level and T11 level are new since 2021. Correlate with any history of prior fracture to these areas and with any current symptoms. 3. Aortic atherosclerosis and coronary artery disease. Aortic Atherosclerosis (ICD10-I70.0). Electronically Signed   By: Zetta Bills M.D.   On: 01/04/2022 17:23   DG Chest Portable 1 View  Result Date: 01/03/2022 CLINICAL DATA:  Provided history: Shortness of breath. EXAM: PORTABLE CHEST 1 VIEW COMPARISON:   Prior chest radiographs 08/08/2021 and earlier. FINDINGS: A tracheostomy tube is noted. Cardiomegaly. Central pulmonary vascular congestion. Prominence of the interstitial lung markings bilaterally, compatible with pulmonary interstitial edema. Irregular and nodular opacities within the right lung base. The nodular opacities measure up to 16 mm. Irregular opacities within the left lung base. No evidence of pneumothorax. No acute bony abnormality identified. IMPRESSION: Cardiomegaly with pulmonary interstitial edema. Opacities within bilateral lung bases, which may reflect atelectasis and/or pneumonia. Superimposed more nodular opacities within the right lung base. A chest CT is recommended to better assess for possible pulmonary nodules at this site. Small left pleural effusion, which may be partially loculated. Electronically Signed   By: Kellie Simmering D.O.   On: 01/03/2022 14:50   US Venous Img Lower Bilateral  Result Date: 01/03/2022 CLINICAL DATA:  Bilateral open wounds. EXAM: BILATERAL LOWER EXTREMITY VENOUS DOPPLER ULTRASOUND TECHNIQUE: Gray-scale sonography with graded compression, as well as color Doppler and duplex ultrasound were performed to evaluate the lower extremity deep venous systems from the level of the common femoral vein and including the common femoral, femoral, profunda femoral, popliteal and calf veins including the posterior tibial, peroneal and gastrocnemius veins when visible. The superficial great saphenous vein was also interrogated. Spectral Doppler was utilized to evaluate flow at rest and with distal augmentation maneuvers in the common femoral, femoral and popliteal veins. COMPARISON:  None Available. FINDINGS: RIGHT LOWER EXTREMITY Common Femoral Vein: No evidence of thrombus. Normal compressibility, respiratory phasicity and response to augmentation. Saphenofemoral Junction: No  evidence of thrombus. Normal compressibility and flow on color Doppler imaging. Profunda Femoral Vein:  No evidence of thrombus. Normal compressibility and flow on color Doppler imaging. Femoral Vein: No evidence of thrombus. Normal compressibility, respiratory phasicity and response to augmentation. Popliteal Vein: No evidence of thrombus. Normal compressibility, respiratory phasicity and response to augmentation. Calf Veins: Limited evaluation. Superficial Great Saphenous Vein: Limited evaluation. Other Findings:  Large amount of subcutaneous edema. LEFT LOWER EXTREMITY Common Femoral Vein: No evidence of thrombus. Normal compressibility, respiratory phasicity and response to augmentation. Saphenofemoral Junction: No evidence of thrombus. Normal compressibility and flow on color Doppler imaging. Profunda Femoral Vein: No evidence of thrombus. Normal compressibility and flow on color Doppler imaging. Femoral Vein: No evidence of thrombus. Normal compressibility, respiratory phasicity and response to augmentation. Popliteal Vein: No evidence of thrombus. Normal compressibility, respiratory phasicity and response to augmentation. Calf Veins: Limited evaluation. Superficial Great Saphenous Vein: No evidence of thrombus. Normal compressibility. Other Findings:  Subcutaneous edema. IMPRESSION: No evidence of deep venous thrombosis in either lower extremity. However, the study has technical limitations due to body habitus and edema. Limited evaluation the bilateral deep calf veins. Electronically Signed   By: Markus Daft M.D.   On: 01/03/2022 13:45    [name], MD  [facility]  If 7PM-7AM, please contact night-coverage [email or web address] Password [email or web address] 01/28/2022, 9:04 AM   LOS: 14 days

## 2022-01-28 NOTE — Progress Notes (Signed)
Upon arrival to room patient had taken inner cannula out of trach. This RT replaced with new one and instructed patient to leave inner cannula in place. Suctioned patient and breathing treatment given.

## 2022-01-29 DIAGNOSIS — R1310 Dysphagia, unspecified: Secondary | ICD-10-CM | POA: Diagnosis not present

## 2022-01-29 DIAGNOSIS — G9341 Metabolic encephalopathy: Secondary | ICD-10-CM | POA: Diagnosis not present

## 2022-01-29 DIAGNOSIS — J690 Pneumonitis due to inhalation of food and vomit: Secondary | ICD-10-CM | POA: Diagnosis not present

## 2022-01-29 DIAGNOSIS — T17908D Unspecified foreign body in respiratory tract, part unspecified causing other injury, subsequent encounter: Secondary | ICD-10-CM | POA: Diagnosis not present

## 2022-01-29 MED ORDER — ORAL CARE MOUTH RINSE
15.0000 mL | OROMUCOSAL | Status: DC
  Administered 2022-01-29 – 2022-02-01 (×12): 15 mL via OROMUCOSAL

## 2022-01-29 MED ORDER — GLYCOPYRROLATE 1 MG PO TABS
1.0000 mg | ORAL_TABLET | Freq: Once | ORAL | Status: DC
  Filled 2022-01-29: qty 1

## 2022-01-29 MED ORDER — ORAL CARE MOUTH RINSE: 15.0000 mL | OROMUCOSAL | Status: DC | PRN

## 2022-01-29 NOTE — Progress Notes (Signed)
RT called because pt had pulled trach tube out again (3rd time today). Upon arrival nursing staff was able to insert spare trach of same size back into patient's stoma. AC called for another spare trach to be brought at bedside. Patient suctioned out and ensured that trach ties were tight. Encouraged patient to quit pulling at trach. Nursing staff placing green mittens on patient however pt is still reaching up and pulling with mittens on.

## 2022-01-29 NOTE — Progress Notes (Signed)
Late entry: Approximately 2115 patient noted to have removed trach collar and pulled out her trach.  New trach of same size at bedside was successfully placed, changed trach ties and reapplied trach collar. Safety mitts applied as patient continues to pull trach out, removed IV site just prior to shift change. Encouraged patient to avoid pulling at trach collar and not remove trach.  Trach suction provided and RT notified, small amount of thick, mucous secretions removed. Notified provider of loss of IV access and inability to obtain new access. Requested robinol SL for excessive secretions, oral robinol ordered x 1 but patient refused when attempting to administer and shoved away. Notified AC and ED staff trained in ultrasound IV placement of need for new IV access.

## 2022-01-29 NOTE — Progress Notes (Signed)
Approximately 1338 trach cleaned and re-inserted (patient had removed both the inner cannula and trach.) Trach collar replaced and nebulizer treatment administered at 1344. BBS noted to be diminished with rhonchi, SpO2 92%.

## 2022-01-29 NOTE — Progress Notes (Signed)
PROGRESS NOTE  Veronica Horton JKD:[id number] DOB: Oct 13, 1960 DOA: 01/14/2022 PCP: [name]  Brief History:  61 year old female with a history of COPD, tobacco abuse, chronic respiratory failure with tracheostomy on 6-8 L at home, laryngeal cancer status post tracheostomy, seizure disorder, GERD, hypertension, orthostatic hypotension presenting with generalized weakness and increasing left lower extremity redness and pain.  The patient visited the emergency department at [facility] on 12/27/2026 with lower extremity edema and pain.  The patient was discharged home from the ED with clindamycin with which she had been compliant.  She continued to have left lower extremity pain and edema.  She stated that she had her left leg against the wall approximately 1 week prior to this admission.  She denies any other recent injuries or falls.  She uses a walker at baseline.  She continues to smoke 1/2 pack/day Although the patient did not formally leave [place], she aggressively advocated for discharge on 01/09/2022. On 01/10/2022, the patient slid out of bed and went to the ED at [facility].  She refused any blood work and was subsequently discharged home. She presented again on 01/14/2022 with shortness of breath with nausea and vomiting.  Chest x-ray showed increased interstitial markings with moderate left pleural effusion and small right pleural effusion.  The patient was started on Lasix.  She was also started on vancomycin and cefepime for pneumonia.  Since admission to the hospital, the patient's condition has waxed and waned without much improvement.  Her mentation also has been waxing and waning.  Palliative medicine was consulted.  Pulmonary medicine was consulted.  She was started antibiotics and lasix.  Although she remained afebrile and hemodynamically stable, her overall medical condition continued to deteriorate with decline in mental status and refusing po  intake.  [place] discussions were held with sister and boyfriend.  They abdicated further decision making to patient's son.  [name] was contacted and favors a more comfort focus approach.  Hospital stay prolonged due to inability to find accepting residential hospice facility.  After speaking with son, he was agreeable for SNF placement with hospice services via [facility].    Assessment and Plan: Acute on chronic respiratory failure with hypoxia and hypercarbia -Multifactorial including aspiration pneumonitis, COPD, fluid overload -The patient is noncompliant with her prescribed diet -Currently stable on 10 L; (She is chronically on 8 L at home) -after [place] discussion with patient and son>>goal now is to focus on comfort measure hopeful for transition to residential hospice. -will add scopolamine patch to help with secretions.    Aspiration pneumonitis (HCC) CT chest on 01/04/2022 showed Multifocal pneumonia with acute on chronic sequela of aspiration 01/16/22 Speech therapy eval>>dys 3 with thin -MRSA+ -on vanc/cefepime--had 6 days -stopped cefepime due to altered mental status --after [place] discussion with patient and son>>goal now is to focus on comfort measure hopeful for transition to residential hospice   Acute on chronic diastolic CHF (congestive heart failure) ([place]) Clinically fluid overloaded initially with pleural effusions and interstitial edema transition to IV lasix as pt intermittently refuses pills>>d/c 9/22 up to uptrending creatinine and euvolemia -01/08/22 echo--EF 65 to 70%, no WMA. Normal RV; trivial TR -after [place] discussion with patient and son>>goal now is to focus on comfort measure hopeful for transition to residential hospice   Hypokalemia -repleted -last mag level 1.9 -Focusing on comfort care and symptomatic management only. -No further bloodwork to be done with intention to  follow electrolytes trend.   Acute renal failure superimposed on stage 3b chronic kidney  disease ([place]) Baseline creatinine 1.6-1.8 Presented with serum creatinine 2.44 Now fluid overloaded>>started IV lasix initially>>stop on 9/22 -after [place] discussion with patient and son>>goal now is to focus on comfort measure hopeful for transition to residential hospice. -No further blood work anticipated; continue symptomatic management only.   Acute metabolic Encephalopathy -remains confused intermittently but not agitated -B12 1968 -folate 31.4 -TSH 16.36, Free T4 = 1.12 -ammonia <10 -VBG 7.48/37/76/27 -obtain UA--no pyuria -order CT brain--No significant interval change in the size of the right parietal mass with associated mass effect and edema -MR brain--3.7 cm right parietal convexity meningioma with associated vasogenic edema, similar to prior; no other acute findings. -Continue supportive care and symptomatic management.   Laryngeal cancer [facility]) - Patient has expressed not interested in further evaluation or treatment for the cancer -Palliative has seen her in the outpatient setting and planning to establish care more consistently -She is DNR. -after [place] discussion with patient and son>>goal now is to focus on comfort measure hopeful for transition to residential hospice VS SNF with hospice folow up -TOC on board to assist with transition of care. -Still looking for a place that can take her to further provide comfort care and management.   Tracheostomy status (HCC) CHRONIC RESPIRATORY FAILURE WITH HYPOXIA - ON 6-8 L AT [place] in place; adequate saturation (with some intermittent hypoxic events when patient remove inner canula)   Tobacco abuse -Tobacco cessation discussed -patient declined nicotine patch.   Class 3 obesity (HCC) -Lifestyle modification were recommended before. -Body mass index is 41.79 kg/m.  -plan is for comfort care and symptomatic management only.   Hypertension -Stable overall -pt refusing po meds initially -now focus on comfort  measures -follow VS and use PRN meds for comfort.   Goals of Care Met with son at bedside and discussed GOC -we discussed patient's overall declining medical condition despite optimal therapy.  We discussed that patient is worse than just a few weeks ago.  We discussed her overall poor prognosis in the setting of her poor baseline function, numerous significant comorbidities and overall FTT and declining health condition.  We discussed her trajectory for recurrent and frequent hospitalizations.  He stated that patient wound not have wanted to live such a poor quality of life. He agrees with DNR and wishes to pursue full comfort measure approach. Plan to consult TOC for assistance with discharge and transition of care plans. All labs and radiographic studies at this point has been discontinued..   Family Communication:  no family at bedside.   Consultants:  palliative, pulm   Code Status:  DNR   DVT Prophylaxis: apixaban     Procedures: As Listed in Progress Note Above   Antibiotics: None Cefepime 9/16>>9/22 Vanc 9/17>>9/22    Subjective: Decreased oral intake reported by nursing staff; no fever, no nausea, no vomiting, no chest pain.  Overnight patient in the removing inner cannula out of her trach; replaced and stabilized.  Objective: Vitals:   01/28/22 1924 01/29/22 0041 01/29/22 0420 01/29/22 0423  BP:    (!) 118/51  Pulse:    95  Resp:    16  Temp:    98.6 F (37 C)  TempSrc:    Oral  SpO2: 95% 100% 93% 93%  Weight:      Height:        Intake/Output Summary (Last 24 hours) at 01/29/2022 0916 Last data filed at 01/29/2022  0424 Gross per 24 hour  Intake 460 ml  Output --  Net 460 ml    Weight change:   Exam: General exam: Alert, awake and able to follow simple commands; comfortable on examination.  No fever. Respiratory system: Clear to auscultation. Respiratory effort normal.  Good saturation on 10 L oxygen supplementation through tracheostomy. Cardiovascular  system: Rate controlled, no rubs, no gallops, unable to see JVD with body habitus. Gastrointestinal system: Abdomen is obese, nondistended, soft and nontender. No organomegaly or masses felt. Normal bowel sounds heard. Central nervous system: Alert and oriented. No focal neurological deficits. Extremities: No cyanosis or clubbing; 1+ edema bilaterally appreciated on exam. Skin: No petechiae; on change bruises/hematoma in the posterior aspect of her right thigh. Psychiatry: Flat affect.  Data Reviewed: I have personally reviewed following labs and imaging studies  Basic Metabolic Panel: No results for input(s): "NA", "K", "CL", "CO2", "GLUCOSE", "BUN", "CREATININE", "CALCIUM", "MG", "PHOS" in the last 168 hours.  CBC: No results for input(s): "WBC", "NEUTROABS", "HGB", "HCT", "MCV", "PLT" in the last 168 hours.  CBG: No results for input(s): "GLUCAP" in the last 168 hours.  Urine analysis:    Component Value Date/Time   COLORURINE STRAW (A) 01/19/2022 1800   APPEARANCEUR HAZY (A) 01/19/2022 1800   LABSPEC 1.009 01/19/2022 1800   PHURINE 5.0 01/19/2022 1800   GLUCOSEU NEGATIVE 01/19/2022 1800   HGBUR MODERATE (A) 01/19/2022 1800   BILIRUBINUR NEGATIVE 01/19/2022 1800   KETONESUR 5 (A) 01/19/2022 1800   PROTEINUR NEGATIVE 01/19/2022 1800   UROBILINOGEN 0.2 09/25/2010 0530   NITRITE NEGATIVE 01/19/2022 1800   LEUKOCYTESUR NEGATIVE 01/19/2022 1800   Sepsis Labs:  Recent Results (from the past 240 hour(s))  Urine Culture     Status: None   Collection Time: 01/19/22  6:00 PM   Specimen: Urine, Clean Catch  Result Value Ref Range Status   Specimen Description   Final    URINE, CLEAN CATCH Performed at [facility], [address] [name], [place] [address]    Special Requests   Final    NONE Performed at [name], [address], [place], [place] [address]    Culture   Final    NO GROWTH Performed at [facility], [place] [address], [place], [place] [address]     Report Status 01/20/2022 FINAL  Final     Scheduled Meds:  ipratropium-albuterol  3 mL Nebulization TID   levothyroxine  75 mcg Intravenous Daily   nystatin   Topical BID   scopolamine  1 patch Transdermal Q72H   Continuous Infusions:  levETIRAcetam Stopped (01/28/22 2214)   Procedures/Studies: MR BRAIN WO CONTRAST  Result Date: 01/20/2022 CLINICAL DATA:  Initial evaluation for mental status change. EXAM: MRI HEAD WITHOUT CONTRAST TECHNIQUE: Multiplanar, multiecho pulse sequences of the brain and surrounding structures were obtained without intravenous contrast. COMPARISON:  Prior CT from 01/19/2022. FINDINGS: Brain: Examination severely degraded by motion artifact, and is nearly nondiagnostic. Cerebral volume within normal limits. No visible foci of restricted diffusion to suggest acute or subacute ischemia. No visible acute or chronic intracranial blood products. Previously identified meningioma centered at the right parietal convexity again seen, measuring up to approximately 3.7 cm in size. Associated regional mass effect with vasogenic edema, similar to prior CT. No other visible mass lesion or mass effect. No significant midline shift. Ventricles grossly within normal limits for size without hydrocephalus. No extra-axial fluid collection. Pituitary gland suprasellar region grossly within normal limits. Vascular: Major intracranial vascular flow voids  are grossly maintained at the skull base, although poorly assessed due to significant motion. Skull and upper cervical spine: Craniocervical junction within normal limits. Bone marrow signal intensity grossly normal. No scalp soft tissue abnormality. Sinuses/Orbits: Globes and orbital soft tissues grossly within normal limits. Paranasal sinuses are largely clear. No obvious significant mastoid effusion. Other: None. IMPRESSION: 1. Severely motion degraded and nearly nondiagnostic examination. 2. 3.7 cm right parietal convexity meningioma with  associated vasogenic edema, similar to prior. 3. No other definite acute intracranial abnormality. Electronically Signed   By: Jeannine Boga M.D.   On: 01/20/2022 23:02   CT HEAD WO CONTRAST (5MM)  Result Date: 01/19/2022 CLINICAL DATA:  Altered mental status. EXAM: CT HEAD WITHOUT CONTRAST TECHNIQUE: Contiguous axial images were obtained from the base of the skull through the vertex without intravenous contrast. RADIATION DOSE REDUCTION: This exam was performed according to the departmental dose-optimization program which includes automated exposure control, adjustment of the mA and/or kV according to patient size and/or use of iterative reconstruction technique. COMPARISON:  Head CT dated 11/28/2020. FINDINGS: Evaluation of this exam is limited due to motion artifact. 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 This is relatively unchanged compared to prior CT. There is associated mass effect and edema in the right parietal lobe also similar to prior CT. There is no acute intracranial hemorrhage. No midline shift. No extra-axial fluid collection. Vascular: No hyperdense vessel or unexpected calcification. Skull: Normal. Negative for fracture or focal lesion. Sinuses/Orbits: No acute finding. Other: None IMPRESSION: 1. No acute intracranial hemorrhage. 2. No significant interval change in the size of the right parietal mass with associated mass effect and edema. Electronically Signed   By: Anner Crete M.D.   On: 01/19/2022 18:31   DG CHEST PORT 1 VIEW  Result Date: 01/18/2022 CLINICAL DATA:  [id number].  Shortness of breath. EXAM: PORTABLE CHEST 1 VIEW COMPARISON:  Portable 01/16/2022 at 8:37 a.m. FINDINGS: 4:56 a.m. An oxygen mask superimposes over the left apical area. There is a tracheostomy cannula with the tip 5.4 cm from the carina. There is a low inspiration. There is patchy  consolidation, atelectasis or combination in the lower lung fields overlying small pleural effusions. The upper lung fields remain clear. No new or worsened opacity is seen. There is mild cardiomegaly without CHF. Stable mediastinum with mild aortic atherosclerosis. Degenerative change thoracic spine and asymmetrically of the right shoulder. IMPRESSION: There previously was interstitial edema which is not seen today. Bibasilar opacities, atelectasis and/or pneumonia, and small underlying pleural effusions appear similar. Overall aeration is unchanged. Electronically Signed   By: Telford Nab M.D.   On: 01/18/2022 06:02   DG Chest Port 1 View  Result Date: 01/16/2022 CLINICAL DATA:  Respiratory distress EXAM: PORTABLE CHEST 1 VIEW COMPARISON:  Chest radiograph 01/15/2019 FINDINGS: Increased bilateral pleural effusions, left-greater-than-right. Right basilar linear and left basilar consolidative opacities may represent atelectasis, but superimposed infection is difficult to exclude. Increased interstitial opacities and peribronchial wall thickening are suggestive of mild pulmonary edema. There is a tracheostomy in place. Visualized upper abdomen is unremarkable. No acute osseous abnormality. IMPRESSION: New mild pulmonary edema with moderate left and small right pleural effusions. Bibasilar opacities may represent atelectasis in the setting of worsening pleural effusions, but a component of superimposed infection is difficult to exclude. Electronically Signed   By: Marin Roberts M.D.   On: 01/16/2022 09:01   DG  Chest Port 1 View  Result Date: 01/14/2022 CLINICAL DATA:  sob EXAM: PORTABLE CHEST 1 VIEW.  Patient is rotated. COMPARISON:  Chest x-ray 01/03/2022, CT chest 01/04/2022 FINDINGS: Tracheostomy tube with tip terminating 8.5 cm above the carina. The heart and mediastinal contours are grossly unchanged. Low lung volumes persistent retrocardiac airspace opacity. No pulmonary edema. Similar-appearing  blunting of bilateral costophrenic angles with no significant pleural effusion. No pneumothorax. No acute osseous abnormality. IMPRESSION: 1. Low lung volumes, persistent retrocardiac airspace opacity with limited evaluation due to patient rotation. 2. Similar-appearing blunting of bilateral costophrenic angles with no significant pleural effusion. Electronically Signed   By: Iven Finn M.D.   On: 01/14/2022 19:49   ECHOCARDIOGRAM COMPLETE  Result Date: 01/07/2022    ECHOCARDIOGRAM REPORT   Patient Name:   MARCEA ROJEK Date of Exam: 01/07/2022 Medical Rec #:  [id number]       Height:       65.5 in Accession #:    [id number]      Weight:       255.0 lb Date of Birth:  10-30-1960       BSA:          2.206 m Patient Age:    61 years        BP:           99/48 mmHg Patient Gender: F               HR:           72 bpm. Exam Location:  [facility] Procedure: 2D Echo, Color Doppler, Cardiac Doppler and Intracardiac            Opacification Agent Indications:    Y63.78 Acute diastolic (congestive) heart failure  History:        Patient has prior history of Echocardiogram examinations, most                 recent 04/19/2021. CHF, Arrythmias:Atrial Fibrillation; Risk                 Factors:Hypertension, Diabetes, Dyslipidemia and Current Smoker.  Sonographer:    Raquel Sarna Senior RDCS Referring Phys: [phone] DAVID TAT  Sonographer Comments: Technically difficult due to patient body habitus. Patient is morbidly obese. IMPRESSIONS  1. Left ventricular ejection fraction, by estimation, is 65 to 70%. The left ventricle has normal function. The left ventricle has no regional wall motion abnormalities. Left ventricular diastolic parameters were normal.  2. Right ventricular systolic function is normal. The right ventricular size is normal. Tricuspid regurgitation signal is inadequate for assessing PA pressure.  3. The mitral valve is normal in structure. No evidence of mitral valve regurgitation. No evidence of mitral stenosis.  4.  The aortic valve was not well visualized. Aortic valve regurgitation is not visualized. No aortic stenosis is present.  5. The inferior vena cava is dilated in size with <50% respiratory variability, suggesting right atrial pressure of 15 mmHg. FINDINGS  Left Ventricle: Left ventricular ejection fraction, by estimation, is 65 to 70%. The left ventricle has normal function. The left ventricle has no regional wall motion abnormalities. Definity contrast agent was given IV to delineate the left ventricular  endocardial borders. The left ventricular internal cavity size was normal in size. There is no left ventricular hypertrophy. Left ventricular diastolic parameters were normal. Right Ventricle: The right ventricular size is normal. Right vetricular wall thickness was not well visualized. Right ventricular systolic function is normal. Tricuspid regurgitation signal is inadequate  for assessing PA pressure. Left Atrium: Left atrial size was normal in size. Right Atrium: Right atrial size was normal in size. Pericardium: There is no evidence of pericardial effusion. Mitral Valve: The mitral valve is normal in structure. No evidence of mitral valve regurgitation. No evidence of mitral valve stenosis. Tricuspid Valve: The tricuspid valve is not well visualized. Tricuspid valve regurgitation is trivial. No evidence of tricuspid stenosis. Aortic Valve: The aortic valve was not well visualized. Aortic valve regurgitation is not visualized. No aortic stenosis is present. Aortic valve mean gradient measures 4.0 mmHg. Aortic valve peak gradient measures 9.3 mmHg. Aortic valve area, by VTI measures 2.33 cm. Pulmonic Valve: The pulmonic valve was not well visualized. Pulmonic valve regurgitation is not visualized. No evidence of pulmonic stenosis. Aorta: The aortic root is normal in size and structure. Venous: The inferior vena cava is dilated in size with less than 50% respiratory variability, suggesting right atrial pressure of  15 mmHg. IAS/Shunts: The interatrial septum was not well visualized.  LEFT VENTRICLE PLAX 2D LVIDd:         4.60 cm LVIDs:         2.50 cm LV PW:         0.80 cm LV IVS:        1.00 cm LVOT diam:     1.80 cm LV SV:         67 LV SV Index:   30 LVOT Area:     2.54 cm  RIGHT VENTRICLE RV S prime:     13.30 cm/s TAPSE (M-mode): 2.0 cm LEFT ATRIUM             Index        RIGHT ATRIUM           Index LA diam:        3.60 cm 1.63 cm/m   RA Area:     20.30 cm LA Vol (A2C):   45.4 ml 20.58 ml/m  RA Volume:   64.40 ml  29.20 ml/m LA Vol (A4C):   63.1 ml 28.61 ml/m LA Biplane Vol: 54.0 ml 24.48 ml/m  AORTIC VALVE AV Area (Vmax):    2.06 cm AV Area (Vmean):   2.15 cm AV Area (VTI):     2.33 cm AV Vmax:           152.23 cm/s AV Vmean:          92.193 cm/s AV VTI:            0.289 m AV Peak Grad:      9.3 mmHg AV Mean Grad:      4.0 mmHg LVOT Vmax:         123.00 cm/s LVOT Vmean:        78.000 cm/s LVOT VTI:          0.264 m LVOT/AV VTI ratio: 0.91  AORTA Ao Root diam: 3.00 cm Ao Asc diam:  2.90 cm  SHUNTS Systemic VTI:  0.26 m Systemic Diam: 1.80 cm Carlyle Dolly MD Electronically signed by Carlyle Dolly MD Signature Date/Time: 01/07/2022/3:23:07 PM    Final    CT CHEST WO CONTRAST  Result Date: 01/04/2022 CLINICAL DATA:  A 61 year old female with chronic dyspnea presents for evaluation in the setting of abnormal chest radiograph. EXAM: CT CHEST WITHOUT CONTRAST TECHNIQUE: Multidetector CT imaging of the chest was performed following the standard protocol without IV contrast. RADIATION DOSE REDUCTION: This exam was performed according to the departmental dose-optimization program which  includes automated exposure control, adjustment of the mA and/or kV according to patient size and/or use of iterative reconstruction technique. COMPARISON:  December of 2021 FINDINGS: Cardiovascular: Scattered aortic atherosclerosis. Three-vessel coronary artery disease. Normal heart size without pericardial effusion or  nodularity. Aberrant RIGHT subclavian artery. Mild engorgement of central pulmonary vasculature. Limited assessment of cardiovascular structures given lack of intravenous contrast. Mediastinum/Nodes: Tracheostomy tube terminating in the proximal trachea. No signs of adenopathy in the chest. No acute process in the mediastinum. Esophagus is grossly normal. Lungs/Pleura: Patchy airspace disease in the chest in LEFT upper lobe, lingula, LEFT lower lobe and RIGHT lung base. Volume loss in the LEFT lung base associated also with consolidative changes and material in bronchial structures. No pneumothorax. Trace pleural effusions. Airways are otherwise patent. Upper Abdomen: Incidental imaging of upper abdominal contents without acute process to the extent evaluated. Musculoskeletal: Spinal degenerative changes. No acute or destructive bone process. Rib fractures of varying ages, no acute fractures most are chronic or subacute and without displacement. Fracture at the T9 level with 20-30% loss of height. Vacuum phenomenon within the fracture site. No surrounding stranding. T11 with loss of height as well. These have occurred since December of 2021. No listhesis. IMPRESSION: 1. Multifocal pneumonia with acute on chronic sequela of aspiration favored at the LEFT lung base. 2. Age-indeterminate compression fractures potentially subacute/recent at the T9 level and T11 level are new since 2021. Correlate with any history of prior fracture to these areas and with any current symptoms. 3. Aortic atherosclerosis and coronary artery disease. Aortic Atherosclerosis (ICD10-I70.0). Electronically Signed   By: Zetta Bills M.D.   On: 01/04/2022 17:23   DG Chest Portable 1 View  Result Date: 01/03/2022 CLINICAL DATA:  Provided history: Shortness of breath. EXAM: PORTABLE CHEST 1 VIEW COMPARISON:  Prior chest radiographs 08/08/2021 and earlier. FINDINGS: A tracheostomy tube is noted. Cardiomegaly. Central pulmonary vascular  congestion. Prominence of the interstitial lung markings bilaterally, compatible with pulmonary interstitial edema. Irregular and nodular opacities within the right lung base. The nodular opacities measure up to 16 mm. Irregular opacities within the left lung base. No evidence of pneumothorax. No acute bony abnormality identified. IMPRESSION: Cardiomegaly with pulmonary interstitial edema. Opacities within bilateral lung bases, which may reflect atelectasis and/or pneumonia. Superimposed more nodular opacities within the right lung base. A chest CT is recommended to better assess for possible pulmonary nodules at this site. Small left pleural effusion, which may be partially loculated. Electronically Signed   By: Kellie Simmering D.O.   On: 01/03/2022 14:50   US Venous Img Lower Bilateral  Result Date: 01/03/2022 CLINICAL DATA:  Bilateral open wounds. EXAM: BILATERAL LOWER EXTREMITY VENOUS DOPPLER ULTRASOUND TECHNIQUE: Gray-scale sonography with graded compression, as well as color Doppler and duplex ultrasound were performed to evaluate the lower extremity deep venous systems from the level of the common femoral vein and including the common femoral, femoral, profunda femoral, popliteal and calf veins including the posterior tibial, peroneal and gastrocnemius veins when visible. The superficial great saphenous vein was also interrogated. Spectral Doppler was utilized to evaluate flow at rest and with distal augmentation maneuvers in the common femoral, femoral and popliteal veins. COMPARISON:  None Available. FINDINGS: RIGHT LOWER EXTREMITY Common Femoral Vein: No evidence of thrombus. Normal compressibility, respiratory phasicity and response to augmentation. Saphenofemoral Junction: No evidence of thrombus. Normal compressibility and flow on color Doppler imaging. Profunda Femoral Vein: No evidence of thrombus. Normal compressibility and flow on color Doppler imaging. Femoral Vein: No evidence of  thrombus. Normal  compressibility, respiratory phasicity and response to augmentation. Popliteal Vein: No evidence of thrombus. Normal compressibility, respiratory phasicity and response to augmentation. Calf Veins: Limited evaluation. Superficial Great Saphenous Vein: Limited evaluation. Other Findings:  Large amount of subcutaneous edema. LEFT LOWER EXTREMITY Common Femoral Vein: No evidence of thrombus. Normal compressibility, respiratory phasicity and response to augmentation. Saphenofemoral Junction: No evidence of thrombus. Normal compressibility and flow on color Doppler imaging. Profunda Femoral Vein: No evidence of thrombus. Normal compressibility and flow on color Doppler imaging. Femoral Vein: No evidence of thrombus. Normal compressibility, respiratory phasicity and response to augmentation. Popliteal Vein: No evidence of thrombus. Normal compressibility, respiratory phasicity and response to augmentation. Calf Veins: Limited evaluation. Superficial Great Saphenous Vein: No evidence of thrombus. Normal compressibility. Other Findings:  Subcutaneous edema. IMPRESSION: No evidence of deep venous thrombosis in either lower extremity. However, the study has technical limitations due to body habitus and edema. Limited evaluation the bilateral deep calf veins. Electronically Signed   By: Markus Daft M.D.   On: 01/03/2022 13:45    [name], MD  [facility]  If 7PM-7AM, please contact night-coverage [email or web address] Password [email or web address] 01/29/2022, 9:16 AM   LOS: 15 days

## 2022-01-29 NOTE — Plan of Care (Signed)
  Problem: Clinical Measurements: Goal: Will remain free from infection Outcome: Progressing   Problem: Clinical Measurements: Goal: Respiratory complications will improve Outcome: Progressing   Problem: Activity: Goal: Risk for activity intolerance will decrease Outcome: Progressing   Problem: Nutrition: Goal: Adequate nutrition will be maintained Outcome: Progressing   Problem: Coping: Goal: Level of anxiety will decrease Outcome: Progressing

## 2022-01-29 NOTE — Plan of Care (Signed)
  Problem: Clinical Measurements: Goal: Respiratory complications will improve Outcome: Progressing   Problem: Skin Integrity: Goal: Risk for impaired skin integrity will decrease Outcome: Progressing

## 2022-01-30 DIAGNOSIS — J690 Pneumonitis due to inhalation of food and vomit: Secondary | ICD-10-CM | POA: Diagnosis not present

## 2022-01-30 DIAGNOSIS — T17908D Unspecified foreign body in respiratory tract, part unspecified causing other injury, subsequent encounter: Secondary | ICD-10-CM | POA: Diagnosis not present

## 2022-01-30 DIAGNOSIS — G9341 Metabolic encephalopathy: Secondary | ICD-10-CM | POA: Diagnosis not present

## 2022-01-30 DIAGNOSIS — Z515 Encounter for palliative care: Secondary | ICD-10-CM | POA: Diagnosis not present

## 2022-01-30 DIAGNOSIS — R1310 Dysphagia, unspecified: Secondary | ICD-10-CM | POA: Diagnosis not present

## 2022-01-30 MED ORDER — GLYCOPYRROLATE 0.2 MG/ML IJ SOLN
0.4000 mg | INTRAMUSCULAR | Status: DC | PRN
  Administered 2022-02-01 (×2): 0.4 mg via INTRAVENOUS
  Filled 2022-01-30 (×3): qty 2

## 2022-01-30 MED ORDER — IPRATROPIUM-ALBUTEROL 0.5-2.5 (3) MG/3ML IN SOLN: 3.0000 mL | Freq: Four times a day (QID) | RESPIRATORY_TRACT | Status: DC | PRN

## 2022-01-30 NOTE — Progress Notes (Signed)
Patient agitated and restless during beginning of shift. Calmed and appeared more comfortable after new IV access obtained and dilaudid and ativan given, see MAR.  Patient slept most of the night until this morning when she woke up restless, noted some facial grimacing. Dilaudid and ativan given as ordered PRN, see MAR. Patient had small stool this am, purewick remains in place due to incontinence.

## 2022-01-30 NOTE — Progress Notes (Signed)
PROGRESS NOTE  Veronica Horton JKD:[id number] DOB: Oct 13, 1960 DOA: 01/14/2022 PCP: [name]  Brief History:  61 year old female with a history of COPD, tobacco abuse, chronic respiratory failure with tracheostomy on 6-8 L at home, laryngeal cancer status post tracheostomy, seizure disorder, GERD, hypertension, orthostatic hypotension presenting with generalized weakness and increasing left lower extremity redness and pain.  The patient visited the emergency department at [facility] on 12/27/2026 with lower extremity edema and pain.  The patient was discharged home from the ED with clindamycin with which she had been compliant.  She continued to have left lower extremity pain and edema.  She stated that she had her left leg against the wall approximately 1 week prior to this admission.  She denies any other recent injuries or falls.  She uses a walker at baseline.  She continues to smoke 1/2 pack/day Although the patient did not formally leave [place], she aggressively advocated for discharge on 01/09/2022. On 01/10/2022, the patient slid out of bed and went to the ED at [facility].  She refused any blood work and was subsequently discharged home. She presented again on 01/14/2022 with shortness of breath with nausea and vomiting.  Chest x-ray showed increased interstitial markings with moderate left pleural effusion and small right pleural effusion.  The patient was started on Lasix.  She was also started on vancomycin and cefepime for pneumonia.  Since admission to the hospital, the patient's condition has waxed and waned without much improvement.  Her mentation also has been waxing and waning.  Palliative medicine was consulted.  Pulmonary medicine was consulted.  She was started antibiotics and lasix.  Although she remained afebrile and hemodynamically stable, her overall medical condition continued to deteriorate with decline in mental status and refusing po  intake.  [place] discussions were held with sister and boyfriend.  They abdicated further decision making to patient's son.  [name] was contacted and favors a more comfort focus approach.  Hospital stay prolonged due to inability to find accepting residential hospice facility.  After speaking with son, he was agreeable for SNF placement with hospice services via [facility].    Assessment and Plan: Acute on chronic respiratory failure with hypoxia and hypercarbia -Multifactorial including aspiration pneumonitis, COPD, fluid overload -The patient is noncompliant with her prescribed diet -Currently stable on 10 L; (She is chronically on 8 L at home) -after [place] discussion with patient and son>>goal now is to focus on comfort measure hopeful for transition to residential hospice. -will add scopolamine patch to help with secretions.    Aspiration pneumonitis (HCC) CT chest on 01/04/2022 showed Multifocal pneumonia with acute on chronic sequela of aspiration 01/16/22 Speech therapy eval>>dys 3 with thin -MRSA+ -on vanc/cefepime--had 6 days -stopped cefepime due to altered mental status --after [place] discussion with patient and son>>goal now is to focus on comfort measure hopeful for transition to residential hospice   Acute on chronic diastolic CHF (congestive heart failure) ([place]) Clinically fluid overloaded initially with pleural effusions and interstitial edema transition to IV lasix as pt intermittently refuses pills>>d/c 9/22 up to uptrending creatinine and euvolemia -01/08/22 echo--EF 65 to 70%, no WMA. Normal RV; trivial TR -after [place] discussion with patient and son>>goal now is to focus on comfort measure hopeful for transition to residential hospice   Hypokalemia -repleted -last mag level 1.9 -Focusing on comfort care and symptomatic management only. -No further bloodwork to be done with intention to  follow electrolytes trend.   Acute renal failure superimposed on stage 3b chronic kidney  disease ([place]) Baseline creatinine 1.6-1.8 Presented with serum creatinine 2.44 Now fluid overloaded>>started IV lasix initially>>stop on 9/22 -after [place] discussion with patient and son>>goal now is to focus on comfort measure hopeful for transition to residential hospice. -No further blood work anticipated; continue symptomatic management only.   Acute metabolic Encephalopathy -remains confused intermittently but not agitated -B12 1968 -folate 31.4 -TSH 16.36, Free T4 = 1.12 -ammonia <10 -VBG 7.48/37/76/27 -obtain UA--no pyuria -order CT brain--No significant interval change in the size of the right parietal mass with associated mass effect and edema -MR brain--3.7 cm right parietal convexity meningioma with associated vasogenic edema, similar to prior; no other acute findings. -Continue supportive care and symptomatic management.   Laryngeal cancer [facility]) - Patient has expressed not interested in further evaluation or treatment for the cancer -Palliative has seen her in the outpatient setting and planning to establish care more consistently -She is DNR. -after [place] discussion with patient and son>>goal now is to focus on comfort measure hopeful for transition to residential hospice VS SNF with hospice folow up -TOC on board to assist with transition of care. -Still looking for a place that can take her to further provide comfort care and management.   Tracheostomy status (HCC) CHRONIC RESPIRATORY FAILURE WITH HYPOXIA - ON 6-8 L AT [place] in place; adequate saturation (with some intermittent hypoxic events when patient remove inner canula)   Tobacco abuse -Tobacco cessation discussed -patient declined nicotine patch.   Class 3 obesity (HCC) -Lifestyle modification were recommended before. -Body mass index is 41.79 kg/m.  -plan is for comfort care and symptomatic management only.   Hypertension -Stable overall -pt refusing po meds initially -now focus on comfort  measures -follow VS and use PRN meds for comfort.   Goals of Care Met with son at bedside and discussed GOC -we discussed patient's overall declining medical condition despite optimal therapy.  We discussed that patient is worse than just a few weeks ago.  We discussed her overall poor prognosis in the setting of her poor baseline function, numerous significant comorbidities and overall FTT and declining health condition.  We discussed her trajectory for recurrent and frequent hospitalizations.  He stated that patient wound not have wanted to live such a poor quality of life. He agrees with DNR and wishes to pursue full comfort measure approach. Plan to consult TOC for assistance with discharge and transition of care plans. All labs and radiographic studies at this point has been discontinued..   Moiture damaged skin -Continue pattern -Foley catheter placement to prevent breakdown comfort care been ordered.  Family Communication:  no family at bedside.   Consultants:  palliative, pulm   Code Status:  DNR   DVT Prophylaxis: apixaban     Procedures: As Listed in Progress Note Above   Antibiotics: None Cefepime 9/16>>9/22 Vanc 9/17>>9/22    Subjective: No overnight events.  No chest pain, no nausea vomiting.  Agitated and restless earlier today.  Objective: Vitals:   01/29/22 2032 01/30/22 0432 01/30/22 0507 01/30/22 0725  BP: (!) 115/59  (!) 110/59   Pulse: 93  88 82  Resp: $Remo'16  18 14  'Wmlfo$ Temp: 98.4 F (36.9 C)  98.6 F (37 C)   TempSrc:      SpO2: 95% 95% 99% 98%  Weight:      Height:        Intake/Output Summary (Last 24 hours) at 01/30/2022 1138  Last data filed at 01/29/2022 2300 Gross per 24 hour  Intake 160 ml  Output 100 ml  Net 60 ml    Weight change:   Exam: General exam: Chronically ill in appearance, oriented x3 and currently calm.  Overnight demonstrating restlessness and agitation at the beginning of the shift.  No nausea or vomiting. Respiratory  system: Diffuse rhonchi are appreciated bilaterally.  No using accessory muscles. Cardiovascular system: No rubs, no gallops.  Unable to properly assess JVD with body habitus. Gastrointestinal system: Abdomen is obese, nondistended, soft and nontender. No organomegaly or masses felt. Normal bowel sounds heard. Central nervous system: Alert and oriented. No focal neurological deficits. Extremities: No cyanosis or clubbing; trace to 1+ edema appreciated bilaterally. Skin: No petechiae; bruises/hematoma posterior aspect of the leg unchanged.  Significant redness in her private areas reported by nursing staff in the setting of moisture damage despite the use of purewick.  Patient appears uncomfortable from that. Psychiatry: flat Affect.  Data Reviewed: I have personally reviewed following labs and imaging studies  CBG: No results for input(s): "GLUCAP" in the last 168 hours.  Urine analysis:    Component Value Date/Time   COLORURINE STRAW (A) 01/19/2022 1800   APPEARANCEUR HAZY (A) 01/19/2022 1800   LABSPEC 1.009 01/19/2022 1800   PHURINE 5.0 01/19/2022 1800   GLUCOSEU NEGATIVE 01/19/2022 1800   HGBUR MODERATE (A) 01/19/2022 1800   BILIRUBINUR NEGATIVE 01/19/2022 1800   KETONESUR 5 (A) 01/19/2022 1800   PROTEINUR NEGATIVE 01/19/2022 1800   UROBILINOGEN 0.2 09/25/2010 0530   NITRITE NEGATIVE 01/19/2022 1800   LEUKOCYTESUR NEGATIVE 01/19/2022 1800   Sepsis Labs:  No results found for this or any previous visit (from the past 240 hour(s)).    Scheduled Meds:  glycopyrrolate  1 mg Oral Once   levothyroxine  75 mcg Intravenous Daily   nystatin   Topical BID   mouth rinse  15 mL Mouth Rinse 4 times per day   scopolamine  1 patch Transdermal Q72H   Continuous Infusions:  levETIRAcetam 500 mg (01/30/22 0910)   Procedures/Studies: MR BRAIN WO CONTRAST  Result Date: 01/20/2022 CLINICAL DATA:  Initial evaluation for mental status change. EXAM: MRI HEAD WITHOUT CONTRAST TECHNIQUE:  Multiplanar, multiecho pulse sequences of the brain and surrounding structures were obtained without intravenous contrast. COMPARISON:  Prior CT from 01/19/2022. FINDINGS: Brain: Examination severely degraded by motion artifact, and is nearly nondiagnostic. Cerebral volume within normal limits. No visible foci of restricted diffusion to suggest acute or subacute ischemia. No visible acute or chronic intracranial blood products. Previously identified meningioma centered at the right parietal convexity again seen, measuring up to approximately 3.7 cm in size. Associated regional mass effect with vasogenic edema, similar to prior CT. No other visible mass lesion or mass effect. No significant midline shift. Ventricles grossly within normal limits for size without hydrocephalus. No extra-axial fluid collection. Pituitary gland suprasellar region grossly within normal limits. Vascular: Major intracranial vascular flow voids are grossly maintained at the skull base, although poorly assessed due to significant motion. Skull and upper cervical spine: Craniocervical junction within normal limits. Bone marrow signal intensity grossly normal. No scalp soft tissue abnormality. Sinuses/Orbits: Globes and orbital soft tissues grossly within normal limits. Paranasal sinuses are largely clear. No obvious significant mastoid effusion. Other: None. IMPRESSION: 1. Severely motion degraded and nearly nondiagnostic examination. 2. 3.7 cm right parietal convexity meningioma with associated vasogenic edema, similar to prior. 3. No other definite acute intracranial abnormality. Electronically Signed   By: Marland Kitchen  Jeannine Boga M.D.   On: 01/20/2022 23:02   CT HEAD WO CONTRAST (5MM)  Result Date: 01/19/2022 CLINICAL DATA:  Altered mental status. EXAM: CT HEAD WITHOUT CONTRAST TECHNIQUE: Contiguous axial images were obtained from the base of the skull through the vertex without intravenous contrast. RADIATION DOSE REDUCTION: This exam was  performed according to the departmental dose-optimization program which includes automated exposure control, adjustment of the mA and/or kV according to patient size and/or use of iterative reconstruction technique. COMPARISON:  Head CT dated 11/28/2020. FINDINGS: Evaluation of this exam is limited due to motion artifact. 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 This is relatively unchanged compared to prior CT. There is associated mass effect and edema in the right parietal lobe also similar to prior CT. There is no acute intracranial hemorrhage. No midline shift. No extra-axial fluid collection. Vascular: No hyperdense vessel or unexpected calcification. Skull: Normal. Negative for fracture or focal lesion. Sinuses/Orbits: No acute finding. Other: None IMPRESSION: 1. No acute intracranial hemorrhage. 2. No significant interval change in the size of the right parietal mass with associated mass effect and edema. Electronically Signed   By: Anner Crete M.D.   On: 01/19/2022 18:31   DG CHEST PORT 1 VIEW  Result Date: 01/18/2022 CLINICAL DATA:  [id number].  Shortness of breath. EXAM: PORTABLE CHEST 1 VIEW COMPARISON:  Portable 01/16/2022 at 8:37 a.m. FINDINGS: 4:56 a.m. An oxygen mask superimposes over the left apical area. There is a tracheostomy cannula with the tip 5.4 cm from the carina. There is a low inspiration. There is patchy consolidation, atelectasis or combination in the lower lung fields overlying small pleural effusions. The upper lung fields remain clear. No new or worsened opacity is seen. There is mild cardiomegaly without CHF. Stable mediastinum with mild aortic atherosclerosis. Degenerative change thoracic spine and asymmetrically of the right shoulder. IMPRESSION: There previously was interstitial edema which is not seen today. Bibasilar opacities, atelectasis and/or  pneumonia, and small underlying pleural effusions appear similar. Overall aeration is unchanged. Electronically Signed   By: Telford Nab M.D.   On: 01/18/2022 06:02   DG Chest Port 1 View  Result Date: 01/16/2022 CLINICAL DATA:  Respiratory distress EXAM: PORTABLE CHEST 1 VIEW COMPARISON:  Chest radiograph 01/15/2019 FINDINGS: Increased bilateral pleural effusions, left-greater-than-right. Right basilar linear and left basilar consolidative opacities may represent atelectasis, but superimposed infection is difficult to exclude. Increased interstitial opacities and peribronchial wall thickening are suggestive of mild pulmonary edema. There is a tracheostomy in place. Visualized upper abdomen is unremarkable. No acute osseous abnormality. IMPRESSION: New mild pulmonary edema with moderate left and small right pleural effusions. Bibasilar opacities may represent atelectasis in the setting of worsening pleural effusions, but a component of superimposed infection is difficult to exclude. Electronically Signed   By: Marin Roberts M.D.   On: 01/16/2022 09:01   DG Chest Port 1 View  Result Date: 01/14/2022 CLINICAL DATA:  sob EXAM: PORTABLE CHEST 1 VIEW.  Patient is rotated. COMPARISON:  Chest x-ray 01/03/2022, CT chest 01/04/2022 FINDINGS: Tracheostomy tube with tip terminating 8.5 cm above the carina. The heart and mediastinal contours are grossly unchanged. Low lung volumes persistent retrocardiac airspace opacity. No pulmonary edema. Similar-appearing blunting of bilateral costophrenic angles with no significant pleural effusion. No pneumothorax. No acute osseous abnormality. IMPRESSION: 1. Low lung volumes, persistent retrocardiac airspace opacity with limited evaluation due to patient rotation. 2. Similar-appearing blunting  of bilateral costophrenic angles with no significant pleural effusion. Electronically Signed   By: Iven Finn M.D.   On: 01/14/2022 19:49   ECHOCARDIOGRAM COMPLETE  Result Date:  01/07/2022    ECHOCARDIOGRAM REPORT   Patient Name:   CHALON ZOBRIST Date of Exam: 01/07/2022 Medical Rec #:  [id number]       Height:       65.5 in Accession #:    [id number]      Weight:       255.0 lb Date of Birth:  1961-02-10       BSA:          2.206 m Patient Age:    77 years        BP:           99/48 mmHg Patient Gender: F               HR:           72 bpm. Exam Location:  [facility] Procedure: 2D Echo, Color Doppler, Cardiac Doppler and Intracardiac            Opacification Agent Indications:    V78.58 Acute diastolic (congestive) heart failure  History:        Patient has prior history of Echocardiogram examinations, most                 recent 04/19/2021. CHF, Arrythmias:Atrial Fibrillation; Risk                 Factors:Hypertension, Diabetes, Dyslipidemia and Current Smoker.  Sonographer:    Raquel Sarna Senior RDCS Referring Phys: [phone] DAVID TAT  Sonographer Comments: Technically difficult due to patient body habitus. Patient is morbidly obese. IMPRESSIONS  1. Left ventricular ejection fraction, by estimation, is 65 to 70%. The left ventricle has normal function. The left ventricle has no regional wall motion abnormalities. Left ventricular diastolic parameters were normal.  2. Right ventricular systolic function is normal. The right ventricular size is normal. Tricuspid regurgitation signal is inadequate for assessing PA pressure.  3. The mitral valve is normal in structure. No evidence of mitral valve regurgitation. No evidence of mitral stenosis.  4. The aortic valve was not well visualized. Aortic valve regurgitation is not visualized. No aortic stenosis is present.  5. The inferior vena cava is dilated in size with <50% respiratory variability, suggesting right atrial pressure of 15 mmHg. FINDINGS  Left Ventricle: Left ventricular ejection fraction, by estimation, is 65 to 70%. The left ventricle has normal function. The left ventricle has no regional wall motion abnormalities. Definity contrast agent was  given IV to delineate the left ventricular  endocardial borders. The left ventricular internal cavity size was normal in size. There is no left ventricular hypertrophy. Left ventricular diastolic parameters were normal. Right Ventricle: The right ventricular size is normal. Right vetricular wall thickness was not well visualized. Right ventricular systolic function is normal. Tricuspid regurgitation signal is inadequate for assessing PA pressure. Left Atrium: Left atrial size was normal in size. Right Atrium: Right atrial size was normal in size. Pericardium: There is no evidence of pericardial effusion. Mitral Valve: The mitral valve is normal in structure. No evidence of mitral valve regurgitation. No evidence of mitral valve stenosis. Tricuspid Valve: The tricuspid valve is not well visualized. Tricuspid valve regurgitation is trivial. No evidence of tricuspid stenosis. Aortic Valve: The aortic valve was not well visualized. Aortic valve regurgitation is not visualized. No aortic stenosis is present. Aortic valve mean gradient  measures 4.0 mmHg. Aortic valve peak gradient measures 9.3 mmHg. Aortic valve area, by VTI measures 2.33 cm. Pulmonic Valve: The pulmonic valve was not well visualized. Pulmonic valve regurgitation is not visualized. No evidence of pulmonic stenosis. Aorta: The aortic root is normal in size and structure. Venous: The inferior vena cava is dilated in size with less than 50% respiratory variability, suggesting right atrial pressure of 15 mmHg. IAS/Shunts: The interatrial septum was not well visualized.  LEFT VENTRICLE PLAX 2D LVIDd:         4.60 cm LVIDs:         2.50 cm LV PW:         0.80 cm LV IVS:        1.00 cm LVOT diam:     1.80 cm LV SV:         67 LV SV Index:   30 LVOT Area:     2.54 cm  RIGHT VENTRICLE RV S prime:     13.30 cm/s TAPSE (M-mode): 2.0 cm LEFT ATRIUM             Index        RIGHT ATRIUM           Index LA diam:        3.60 cm 1.63 cm/m   RA Area:     20.30 cm LA  Vol (A2C):   45.4 ml 20.58 ml/m  RA Volume:   64.40 ml  29.20 ml/m LA Vol (A4C):   63.1 ml 28.61 ml/m LA Biplane Vol: 54.0 ml 24.48 ml/m  AORTIC VALVE AV Area (Vmax):    2.06 cm AV Area (Vmean):   2.15 cm AV Area (VTI):     2.33 cm AV Vmax:           152.23 cm/s AV Vmean:          92.193 cm/s AV VTI:            0.289 m AV Peak Grad:      9.3 mmHg AV Mean Grad:      4.0 mmHg LVOT Vmax:         123.00 cm/s LVOT Vmean:        78.000 cm/s LVOT VTI:          0.264 m LVOT/AV VTI ratio: 0.91  AORTA Ao Root diam: 3.00 cm Ao Asc diam:  2.90 cm  SHUNTS Systemic VTI:  0.26 m Systemic Diam: 1.80 cm Carlyle Dolly MD Electronically signed by Carlyle Dolly MD Signature Date/Time: 01/07/2022/3:23:07 PM    Final    CT CHEST WO CONTRAST  Result Date: 01/04/2022 CLINICAL DATA:  A 61 year old female with chronic dyspnea presents for evaluation in the setting of abnormal chest radiograph. EXAM: CT CHEST WITHOUT CONTRAST TECHNIQUE: Multidetector CT imaging of the chest was performed following the standard protocol without IV contrast. RADIATION DOSE REDUCTION: This exam was performed according to the departmental dose-optimization program which includes automated exposure control, adjustment of the mA and/or kV according to patient size and/or use of iterative reconstruction technique. COMPARISON:  December of 2021 FINDINGS: Cardiovascular: Scattered aortic atherosclerosis. Three-vessel coronary artery disease. Normal heart size without pericardial effusion or nodularity. Aberrant RIGHT subclavian artery. Mild engorgement of central pulmonary vasculature. Limited assessment of cardiovascular structures given lack of intravenous contrast. Mediastinum/Nodes: Tracheostomy tube terminating in the proximal trachea. No signs of adenopathy in the chest. No acute process in the mediastinum. Esophagus is grossly normal. Lungs/Pleura: Patchy airspace disease in the chest  in LEFT upper lobe, lingula, LEFT lower lobe and RIGHT lung base.  Volume loss in the LEFT lung base associated also with consolidative changes and material in bronchial structures. No pneumothorax. Trace pleural effusions. Airways are otherwise patent. Upper Abdomen: Incidental imaging of upper abdominal contents without acute process to the extent evaluated. Musculoskeletal: Spinal degenerative changes. No acute or destructive bone process. Rib fractures of varying ages, no acute fractures most are chronic or subacute and without displacement. Fracture at the T9 level with 20-30% loss of height. Vacuum phenomenon within the fracture site. No surrounding stranding. T11 with loss of height as well. These have occurred since December of 2021. No listhesis. IMPRESSION: 1. Multifocal pneumonia with acute on chronic sequela of aspiration favored at the LEFT lung base. 2. Age-indeterminate compression fractures potentially subacute/recent at the T9 level and T11 level are new since 2021. Correlate with any history of prior fracture to these areas and with any current symptoms. 3. Aortic atherosclerosis and coronary artery disease. Aortic Atherosclerosis (ICD10-I70.0). Electronically Signed   By: Zetta Bills M.D.   On: 01/04/2022 17:23   DG Chest Portable 1 View  Result Date: 01/03/2022 CLINICAL DATA:  Provided history: Shortness of breath. EXAM: PORTABLE CHEST 1 VIEW COMPARISON:  Prior chest radiographs 08/08/2021 and earlier. FINDINGS: A tracheostomy tube is noted. Cardiomegaly. Central pulmonary vascular congestion. Prominence of the interstitial lung markings bilaterally, compatible with pulmonary interstitial edema. Irregular and nodular opacities within the right lung base. The nodular opacities measure up to 16 mm. Irregular opacities within the left lung base. No evidence of pneumothorax. No acute bony abnormality identified. IMPRESSION: Cardiomegaly with pulmonary interstitial edema. Opacities within bilateral lung bases, which may reflect atelectasis and/or pneumonia.  Superimposed more nodular opacities within the right lung base. A chest CT is recommended to better assess for possible pulmonary nodules at this site. Small left pleural effusion, which may be partially loculated. Electronically Signed   By: Kellie Simmering D.O.   On: 01/03/2022 14:50   US Venous Img Lower Bilateral  Result Date: 01/03/2022 CLINICAL DATA:  Bilateral open wounds. EXAM: BILATERAL LOWER EXTREMITY VENOUS DOPPLER ULTRASOUND TECHNIQUE: Gray-scale sonography with graded compression, as well as color Doppler and duplex ultrasound were performed to evaluate the lower extremity deep venous systems from the level of the common femoral vein and including the common femoral, femoral, profunda femoral, popliteal and calf veins including the posterior tibial, peroneal and gastrocnemius veins when visible. The superficial great saphenous vein was also interrogated. Spectral Doppler was utilized to evaluate flow at rest and with distal augmentation maneuvers in the common femoral, femoral and popliteal veins. COMPARISON:  None Available. FINDINGS: RIGHT LOWER EXTREMITY Common Femoral Vein: No evidence of thrombus. Normal compressibility, respiratory phasicity and response to augmentation. Saphenofemoral Junction: No evidence of thrombus. Normal compressibility and flow on color Doppler imaging. Profunda Femoral Vein: No evidence of thrombus. Normal compressibility and flow on color Doppler imaging. Femoral Vein: No evidence of thrombus. Normal compressibility, respiratory phasicity and response to augmentation. Popliteal Vein: No evidence of thrombus. Normal compressibility, respiratory phasicity and response to augmentation. Calf Veins: Limited evaluation. Superficial Great Saphenous Vein: Limited evaluation. Other Findings:  Large amount of subcutaneous edema. LEFT LOWER EXTREMITY Common Femoral Vein: No evidence of thrombus. Normal compressibility, respiratory phasicity and response to augmentation.  Saphenofemoral Junction: No evidence of thrombus. Normal compressibility and flow on color Doppler imaging. Profunda Femoral Vein: No evidence of thrombus. Normal compressibility and flow on color Doppler imaging. Femoral Vein: No evidence of  thrombus. Normal compressibility, respiratory phasicity and response to augmentation. Popliteal Vein: No evidence of thrombus. Normal compressibility, respiratory phasicity and response to augmentation. Calf Veins: Limited evaluation. Superficial Great Saphenous Vein: No evidence of thrombus. Normal compressibility. Other Findings:  Subcutaneous edema. IMPRESSION: No evidence of deep venous thrombosis in either lower extremity. However, the study has technical limitations due to body habitus and edema. Limited evaluation the bilateral deep calf veins. Electronically Signed   By: Markus Daft M.D.   On: 01/03/2022 13:45    [name], MD  [facility]  If 7PM-7AM, please contact night-coverage [email or web address] Password [email or web address] 01/30/2022, 11:38 AM   LOS: 16 days

## 2022-01-30 NOTE — Progress Notes (Addendum)
Palliative:  HPI: 61 y.o. 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   I reviewed chart and records. Discussed with RN and Dr. Dyann Kief. Veronica Horton is sleeping peacefully and no family at bedside. I adjusted Robinul PRN for improved secretion control if needed. Per RN Veronica Horton continues with some intake. I worry with her increased symptoms overnight. May monitor for worsening to see if she is declining to be appropriate for hospice facility - not convinced this is the case today but may be if she is having more symptom burden. Continue comfort measures and disposition remains a challenge.   Exam: Sleeping comfortably without distress. I did not awaken her.   Plan: - Continue comfort measures as previously decided by family.  - Will continue to monitor peripherally for symptom management and prognostication if she is becoming appropriate for hospice facility.   25 min  Vinie Sill, NP Palliative Medicine Team Pager [phone] (Please see [email or web address] for schedule) Team Phone [phone]    Greater than 50%  of this time was spent counseling and coordinating care related to the above assessment and plan

## 2022-01-31 NOTE — Progress Notes (Signed)
Palliative:  HPI: 61 y.o. 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   I met today with [name] but she is agitated and with labored breathing. She is unable to speak with me and make needs known. No family/visitors at bedside. I discussed with RN who reports increased medication needed for comfort and she is not longer eating or drinking. She is resistant to intake and care at this stage. I attempted to call son to discuss progression but no answer. At this stage with her decline in intake, declined mentation, and increased need for symptom management I feel she would be a good candidate for inpatient hospice facility - discussed with TOC.   Exam: Agitated, labored breathing. Trach. Mittens on hands - does not appear comfortable.   Plan: - DNR, comfort care - May need to consider scheduled medication for comfort but will continue PRN for now - Consider transition to hospice facility  25 min   , NP Palliative Medicine Team Pager [phone] (Please see [email or web address] for schedule) Team Phone [phone]    Greater than 50%  of this time was spent counseling and coordinating care related to the above assessment and plan   

## 2022-01-31 NOTE — TOC Progression Note (Addendum)
Transition of Care [facility]) - Progression Note    Patient Details  Name: [name] MRN: [id number] Date of Birth: 02-20-1961  Transition of [facility]) CM/SW Contact  Shade Flood, LCSW Phone Number: 01/31/2022, 2:19 PM  Clinical Narrative:     TOC following. Spoke with Angie at [facility] who states that she has not heard from her clinical team about whether pt is a candidate for their SNF. She states she will let TOC know when she has an answer. They also do not have a bed available yet.  Also discussed pt's status with Palliative APNP who states that pt is not eating or drinking and she is requiring a fair amount of IV pain medication. She suggests that [facility] contact hospice again to see if pt now meets criteria for residential hospice. Will reach out to [place].  TOC will follow.  1440: Spoke with Shanita at [facility] to request re-evaluation for residential hospice. She states that she will come to [facility] tomorrow AM to assess patient.  Expected Discharge Plan: [employer] Barriers to Discharge: Continued Medical Work up  Expected Discharge Plan and Services Expected Discharge Plan: [place] In-house Referral: Clinical Social Work   Post Acute Care Choice: [place] Living arrangements for the past 2 months: [place] Determinants of Health (SDOH) Interventions    Readmission Risk Interventions    08/09/2021    2:37 PM 04/26/2020   10:28 AM  Readmission Risk Prevention Plan  Transportation Screening Complete Complete  Medication Review (RN Care Manager) Complete Complete  PCP or Specialist appointment within 3-5 days of discharge  Complete  HRI or [place] Complete Complete  SW Recovery Care/Counseling Consult Complete Complete  Palliative Care Screening Not Applicable Not [place] Not Applicable Patient Refused

## 2022-01-31 NOTE — Progress Notes (Signed)
PROGRESS NOTE  [name]:[id number] DOB: Oct 13, 1960 DOA: 01/14/2022 PCP: [name]  Brief History:  61 year old female with a history of COPD, tobacco abuse, chronic respiratory failure with tracheostomy on 6-8 L at home, laryngeal cancer status post tracheostomy, seizure disorder, GERD, hypertension, orthostatic hypotension presenting with generalized weakness and increasing left lower extremity redness and pain.  The patient visited the emergency department at [facility] on 12/27/2026 with lower extremity edema and pain.  The patient was discharged home from the ED with clindamycin with which she had been compliant.  She continued to have left lower extremity pain and edema.  She stated that she had her left leg against the wall approximately 1 week prior to this admission.  She denies any other recent injuries or falls.  She uses a walker at baseline.  She continues to smoke 1/2 pack/day Although the patient did not formally leave [place], she aggressively advocated for discharge on 01/09/2022. On 01/10/2022, the patient slid out of bed and went to the ED at [facility].  She refused any blood work and was subsequently discharged home. She presented again on 01/14/2022 with shortness of breath with nausea and vomiting.  Chest x-ray showed increased interstitial markings with moderate left pleural effusion and small right pleural effusion.  The patient was started on Lasix.  She was also started on vancomycin and cefepime for pneumonia.  Since admission to the hospital, the patient's condition has waxed and waned without much improvement.  Her mentation also has been waxing and waning.  Palliative medicine was consulted.  Pulmonary medicine was consulted.  She was started antibiotics and lasix.  Although she remained afebrile and hemodynamically stable, her overall medical condition continued to deteriorate with decline in mental status and refusing po  intake.  [place] discussions were held with sister and boyfriend.  They abdicated further decision making to patient's son.  [name] was contacted and favors a more comfort focus approach.  Hospital stay prolonged due to inability to find accepting residential hospice facility.  After speaking with son, he was agreeable for SNF placement with hospice services via [facility].    Assessment and Plan: Acute on chronic respiratory failure with hypoxia and hypercarbia -Multifactorial including aspiration pneumonitis, COPD, fluid overload -The patient is noncompliant with her prescribed diet -Currently stable on 10 L; (She is chronically on 8 L at home) -after [place] discussion with patient and son>>goal now is to focus on comfort measure hopeful for transition to residential hospice. -will add scopolamine patch to help with secretions.    Aspiration pneumonitis (HCC) CT chest on 01/04/2022 showed Multifocal pneumonia with acute on chronic sequela of aspiration 01/16/22 Speech therapy eval>>dys 3 with thin -MRSA+ -on vanc/cefepime--had 6 days -stopped cefepime due to altered mental status --after [place] discussion with patient and son>>goal now is to focus on comfort measure hopeful for transition to residential hospice   Acute on chronic diastolic CHF (congestive heart failure) ([place]) Clinically fluid overloaded initially with pleural effusions and interstitial edema transition to IV lasix as pt intermittently refuses pills>>d/c 9/22 up to uptrending creatinine and euvolemia -01/08/22 echo--EF 65 to 70%, no WMA. Normal RV; trivial TR -after [place] discussion with patient and son>>goal now is to focus on comfort measure hopeful for transition to residential hospice   Hypokalemia -repleted -last mag level 1.9 -Focusing on comfort care and symptomatic management only. -No further bloodwork to be done with intention to  follow electrolytes trend.   Acute renal failure superimposed on stage 3b chronic kidney  disease ([place]) Baseline creatinine 1.6-1.8 Presented with serum creatinine 2.44 Now fluid overloaded>>started IV lasix initially>>stop on 9/22 -after [place] discussion with patient and son>>goal now is to focus on comfort measure hopeful for transition to residential hospice. -No further blood work anticipated; continue symptomatic management only.   Acute metabolic Encephalopathy -remains confused intermittently but not agitated -B12 1968 -folate 31.4 -TSH 16.36, Free T4 = 1.12 -ammonia <10 -VBG 7.48/37/76/27 -obtain UA--no pyuria -order CT brain--No significant interval change in the size of the right parietal mass with associated mass effect and edema -MR brain--3.7 cm right parietal convexity meningioma with associated vasogenic edema, similar to prior; no other acute findings. -Continue supportive care and symptomatic management.   Laryngeal cancer [facility]) - Patient has expressed not interested in further evaluation or treatment for the cancer -Palliative has seen her in the outpatient setting and planning to establish care more consistently -She is DNR. -after [place] discussion with patient and son>>goal now is to focus on comfort measure hopeful for transition to residential hospice VS SNF with hospice folow up -TOC on board to assist with transition of care. -Still looking for a place that can take her to further provide comfort care and management.   Tracheostomy status (HCC) CHRONIC RESPIRATORY FAILURE WITH HYPOXIA - ON 6-8 L AT [place] in place; adequate saturation (with some intermittent hypoxic events when patient remove inner canula)   Tobacco abuse -Tobacco cessation discussed -patient declined nicotine patch.   Class 3 obesity (HCC) -Lifestyle modification were recommended before. -Body mass index is 41.79 kg/m.  -plan is for comfort care and symptomatic management only.   Hypertension -Stable overall -pt refusing po meds initially -now focus on comfort  measures -follow VS and use PRN meds for comfort.   Goals of Care Met with son at bedside and discussed GOC -we discussed patient's overall declining medical condition despite optimal therapy.  We discussed that patient is worse than just a few weeks ago.  We discussed her overall poor prognosis in the setting of her poor baseline function, numerous significant comorbidities and overall FTT and declining health condition.  We discussed her trajectory for recurrent and frequent hospitalizations.  He stated that patient wound not have wanted to live such a poor quality of life. He agrees with DNR and wishes to pursue full comfort measure approach. Plan to consult TOC for assistance with discharge and transition of care plans. All labs and radiographic studies at this point has been discontinued..   Moiture damaged skin -Continue pattern -Foley catheter placement to prevent breakdown comfort care been ordered.  Family Communication:  no family at bedside.   Consultants:  palliative, pulm   Code Status:  DNR   DVT Prophylaxis: apixaban     Procedures: As Listed in Progress Note Above   Antibiotics: None Cefepime 9/16>>9/22 Vanc 9/17>>9/22    Subjective: No overnight events, resting comfortably.  General appears to be in acute distress.  [place] in place (to prevent patient to remove inner cannula or trach collar).  Afebrile.  Objective: Vitals:   01/30/22 1900 01/30/22 2134 01/31/22 0726 01/31/22 1115  BP: (!) 142/132 126/76    Pulse: (!) 125 (!) 105 87 84  Resp: (!) _0 Temp: 98.2 F (36.8 C) 98.3 F (36.8 C)    TempSrc: Axillary Axillary    SpO2: (!) 20% (!) 89% 90% 91%  Weight:  Height:        Intake/Output Summary (Last 24 hours) at 01/31/2022 1312 Last data filed at 01/31/2022 0900 Gross per 24 hour  Intake 0 ml  Output --  Net 0 ml    Weight change:   Exam: General exam: In no acute distress; no overnight events. Respiratory system: Diffuse  rhonchi appreciated; no using accessory muscles.  Tracheostomy in place with 10 L oxygen supplementation. Cardiovascular system:RRR. No rubs or gallops. Gastrointestinal system: Abdomen is obese, nondistended, soft and nontender. No organomegaly or masses felt. Normal bowel sounds heard. Central nervous system: Alert and oriented. No focal neurological deficits. Extremities: No cyanosis or clubbing.  Trace to 1+ edema appreciated bilaterally. Skin: No petechiae. Psychiatry: Flat affect; depressed mood.  Data Reviewed: I have personally reviewed following labs and imaging studies  CBG: No results for input(s): "GLUCAP" in the last 168 hours.  Urine analysis:    Component Value Date/Time   COLORURINE STRAW (A) 01/19/2022 1800   APPEARANCEUR HAZY (A) 01/19/2022 1800   LABSPEC 1.009 01/19/2022 1800   PHURINE 5.0 01/19/2022 1800   GLUCOSEU NEGATIVE 01/19/2022 1800   HGBUR MODERATE (A) 01/19/2022 1800   BILIRUBINUR NEGATIVE 01/19/2022 1800   KETONESUR 5 (A) 01/19/2022 1800   PROTEINUR NEGATIVE 01/19/2022 1800   UROBILINOGEN 0.2 09/25/2010 0530   NITRITE NEGATIVE 01/19/2022 1800   LEUKOCYTESUR NEGATIVE 01/19/2022 1800   Sepsis Labs:  No results found for this or any previous visit (from the past 240 hour(s)).    Scheduled Meds:  glycopyrrolate  1 mg Oral Once   levothyroxine  75 mcg Intravenous Daily   nystatin   Topical BID   mouth rinse  15 mL Mouth Rinse 4 times per day   scopolamine  1 patch Transdermal Q72H   Continuous Infusions:  levETIRAcetam 500 mg (01/31/22 0845)   Procedures/Studies: MR BRAIN WO CONTRAST  Result Date: 01/20/2022 CLINICAL DATA:  Initial evaluation for mental status change. EXAM: MRI HEAD WITHOUT CONTRAST TECHNIQUE: Multiplanar, multiecho pulse sequences of the brain and surrounding structures were obtained without intravenous contrast. COMPARISON:  Prior CT from 01/19/2022. FINDINGS: Brain: Examination severely degraded by motion artifact, and is  nearly nondiagnostic. Cerebral volume within normal limits. No visible foci of restricted diffusion to suggest acute or subacute ischemia. No visible acute or chronic intracranial blood products. Previously identified meningioma centered at the right parietal convexity again seen, measuring up to approximately 3.7 cm in size. Associated regional mass effect with vasogenic edema, similar to prior CT. No other visible mass lesion or mass effect. No significant midline shift. Ventricles grossly within normal limits for size without hydrocephalus. No extra-axial fluid collection. Pituitary gland suprasellar region grossly within normal limits. Vascular: Major intracranial vascular flow voids are grossly maintained at the skull base, although poorly assessed due to significant motion. Skull and upper cervical spine: Craniocervical junction within normal limits. Bone marrow signal intensity grossly normal. No scalp soft tissue abnormality. Sinuses/Orbits: Globes and orbital soft tissues grossly within normal limits. Paranasal sinuses are largely clear. No obvious significant mastoid effusion. Other: None. IMPRESSION: 1. Severely motion degraded and nearly nondiagnostic examination. 2. 3.7 cm right parietal convexity meningioma with associated vasogenic edema, similar to prior. 3. No other definite acute intracranial abnormality. Electronically Signed   By: Jeannine Boga M.D.   On: 01/20/2022 23:02   CT HEAD WO CONTRAST (5MM)  Result Date: 01/19/2022 CLINICAL DATA:  Altered mental status. EXAM: CT HEAD WITHOUT CONTRAST TECHNIQUE: Contiguous axial images were obtained from the base of  the skull through the vertex without intravenous contrast. RADIATION DOSE REDUCTION: This exam was performed according to the departmental dose-optimization program which includes automated exposure control, adjustment of the mA and/or kV according to patient size and/or use of iterative reconstruction technique. COMPARISON:  Head  CT dated 11/28/2020. FINDINGS: Evaluation of this exam is limited due to motion artifact. 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 This is relatively unchanged compared to prior CT. There is associated mass effect and edema in the right parietal lobe also similar to prior CT. There is no acute intracranial hemorrhage. No midline shift. No extra-axial fluid collection. Vascular: No hyperdense vessel or unexpected calcification. Skull: Normal. Negative for fracture or focal lesion. Sinuses/Orbits: No acute finding. Other: None IMPRESSION: 1. No acute intracranial hemorrhage. 2. No significant interval change in the size of the right parietal mass with associated mass effect and edema. Electronically Signed   By: Anner Crete M.D.   On: 01/19/2022 18:31   DG CHEST PORT 1 VIEW  Result Date: 01/18/2022 CLINICAL DATA:  31497.  Shortness of breath. EXAM: PORTABLE CHEST 1 VIEW COMPARISON:  Portable 01/16/2022 at 8:37 a.m. FINDINGS: 4:56 a.m. An oxygen mask superimposes over the left apical area. There is a tracheostomy cannula with the tip 5.4 cm from the carina. There is a low inspiration. There is patchy consolidation, atelectasis or combination in the lower lung fields overlying small pleural effusions. The upper lung fields remain clear. No new or worsened opacity is seen. There is mild cardiomegaly without CHF. Stable mediastinum with mild aortic atherosclerosis. Degenerative change thoracic spine and asymmetrically of the right shoulder. IMPRESSION: There previously was interstitial edema which is not seen today. Bibasilar opacities, atelectasis and/or pneumonia, and small underlying pleural effusions appear similar. Overall aeration is unchanged. Electronically Signed   By: Telford Nab M.D.   On: 01/18/2022 06:02   DG Chest Port 1 View  Result Date: 01/16/2022 CLINICAL DATA:   Respiratory distress EXAM: PORTABLE CHEST 1 VIEW COMPARISON:  Chest radiograph 01/15/2019 FINDINGS: Increased bilateral pleural effusions, left-greater-than-right. Right basilar linear and left basilar consolidative opacities may represent atelectasis, but superimposed infection is difficult to exclude. Increased interstitial opacities and peribronchial wall thickening are suggestive of mild pulmonary edema. There is a tracheostomy in place. Visualized upper abdomen is unremarkable. No acute osseous abnormality. IMPRESSION: New mild pulmonary edema with moderate left and small right pleural effusions. Bibasilar opacities may represent atelectasis in the setting of worsening pleural effusions, but a component of superimposed infection is difficult to exclude. Electronically Signed   By: Marin Roberts M.D.   On: 01/16/2022 09:01   DG Chest Port 1 View  Result Date: 01/14/2022 CLINICAL DATA:  sob EXAM: PORTABLE CHEST 1 VIEW.  Patient is rotated. COMPARISON:  Chest x-ray 01/03/2022, CT chest 01/04/2022 FINDINGS: Tracheostomy tube with tip terminating 8.5 cm above the carina. The heart and mediastinal contours are grossly unchanged. Low lung volumes persistent retrocardiac airspace opacity. No pulmonary edema. Similar-appearing blunting of bilateral costophrenic angles with no significant pleural effusion. No pneumothorax. No acute osseous abnormality. IMPRESSION: 1. Low lung volumes, persistent retrocardiac airspace opacity with limited evaluation due to patient rotation. 2. Similar-appearing blunting of bilateral costophrenic angles with no significant pleural effusion. Electronically Signed   By: Iven Finn M.D.   On: 01/14/2022 19:49   ECHOCARDIOGRAM COMPLETE  Result Date: 01/07/2022    ECHOCARDIOGRAM REPORT   Patient  Name:   Mikey Kirschner Date of Exam: 01/07/2022 Medical Rec #:  [id number]       Height:       65.5 in Accession #:    [id number]      Weight:       255.0 lb Date of Birth:  [birth year]        BSA:          2.206 m Patient Age:    48 years        BP:           99/48 mmHg Patient Gender: F               HR:           72 bpm. Exam Location:  [facility] Procedure: 2D Echo, Color Doppler, Cardiac Doppler and Intracardiac            Opacification Agent Indications:    H20.94 Acute diastolic (congestive) heart failure  History:        Patient has prior history of Echocardiogram examinations, most                 recent 04/19/2021. CHF, Arrythmias:Atrial Fibrillation; Risk                 Factors:Hypertension, Diabetes, Dyslipidemia and Current Smoker.  Sonographer:    Raquel Sarna Senior RDCS Referring Phys: [phone] DAVID TAT  Sonographer Comments: Technically difficult due to patient body habitus. Patient is morbidly obese. IMPRESSIONS  1. Left ventricular ejection fraction, by estimation, is 65 to 70%. The left ventricle has normal function. The left ventricle has no regional wall motion abnormalities. Left ventricular diastolic parameters were normal.  2. Right ventricular systolic function is normal. The right ventricular size is normal. Tricuspid regurgitation signal is inadequate for assessing PA pressure.  3. The mitral valve is normal in structure. No evidence of mitral valve regurgitation. No evidence of mitral stenosis.  4. The aortic valve was not well visualized. Aortic valve regurgitation is not visualized. No aortic stenosis is present.  5. The inferior vena cava is dilated in size with <50% respiratory variability, suggesting right atrial pressure of 15 mmHg. FINDINGS  Left Ventricle: Left ventricular ejection fraction, by estimation, is 65 to 70%. The left ventricle has normal function. The left ventricle has no regional wall motion abnormalities. Definity contrast agent was given IV to delineate the left ventricular  endocardial borders. The left ventricular internal cavity size was normal in size. There is no left ventricular hypertrophy. Left ventricular diastolic parameters were normal. Right  Ventricle: The right ventricular size is normal. Right vetricular wall thickness was not well visualized. Right ventricular systolic function is normal. Tricuspid regurgitation signal is inadequate for assessing PA pressure. Left Atrium: Left atrial size was normal in size. Right Atrium: Right atrial size was normal in size. Pericardium: There is no evidence of pericardial effusion. Mitral Valve: The mitral valve is normal in structure. No evidence of mitral valve regurgitation. No evidence of mitral valve stenosis. Tricuspid Valve: The tricuspid valve is not well visualized. Tricuspid valve regurgitation is trivial. No evidence of tricuspid stenosis. Aortic Valve: The aortic valve was not well visualized. Aortic valve regurgitation is not visualized. No aortic stenosis is present. Aortic valve mean gradient measures 4.0 mmHg. Aortic valve peak gradient measures 9.3 mmHg. Aortic valve area, by VTI measures 2.33 cm. Pulmonic Valve: The pulmonic valve was not well visualized. Pulmonic valve regurgitation is not visualized. No evidence of pulmonic stenosis. Aorta:  The aortic root is normal in size and structure. Venous: The inferior vena cava is dilated in size with less than 50% respiratory variability, suggesting right atrial pressure of 15 mmHg. IAS/Shunts: The interatrial septum was not well visualized.  LEFT VENTRICLE PLAX 2D LVIDd:         4.60 cm LVIDs:         2.50 cm LV PW:         0.80 cm LV IVS:        1.00 cm LVOT diam:     1.80 cm LV SV:         67 LV SV Index:   30 LVOT Area:     2.54 cm  RIGHT VENTRICLE RV S prime:     13.30 cm/s TAPSE (M-mode): 2.0 cm LEFT ATRIUM             Index        RIGHT ATRIUM           Index LA diam:        3.60 cm 1.63 cm/m   RA Area:     20.30 cm LA Vol (A2C):   45.4 ml 20.58 ml/m  RA Volume:   64.40 ml  29.20 ml/m LA Vol (A4C):   63.1 ml 28.61 ml/m LA Biplane Vol: 54.0 ml 24.48 ml/m  AORTIC VALVE AV Area (Vmax):    2.06 cm AV Area (Vmean):   2.15 cm AV Area (VTI):      2.33 cm AV Vmax:           152.23 cm/s AV Vmean:          92.193 cm/s AV VTI:            0.289 m AV Peak Grad:      9.3 mmHg AV Mean Grad:      4.0 mmHg LVOT Vmax:         123.00 cm/s LVOT Vmean:        78.000 cm/s LVOT VTI:          0.264 m LVOT/AV VTI ratio: 0.91  AORTA Ao Root diam: 3.00 cm Ao Asc diam:  2.90 cm  SHUNTS Systemic VTI:  0.26 m Systemic Diam: 1.80 cm Carlyle Dolly MD Electronically signed by Carlyle Dolly MD Signature Date/Time: 01/07/2022/3:23:07 PM    Final    CT CHEST WO CONTRAST  Result Date: 01/04/2022 CLINICAL DATA:  A 60 year old female with chronic dyspnea presents for evaluation in the setting of abnormal chest radiograph. EXAM: CT CHEST WITHOUT CONTRAST TECHNIQUE: Multidetector CT imaging of the chest was performed following the standard protocol without IV contrast. RADIATION DOSE REDUCTION: This exam was performed according to the departmental dose-optimization program which includes automated exposure control, adjustment of the mA and/or kV according to patient size and/or use of iterative reconstruction technique. COMPARISON:  December of 2021 FINDINGS: Cardiovascular: Scattered aortic atherosclerosis. Three-vessel coronary artery disease. Normal heart size without pericardial effusion or nodularity. Aberrant RIGHT subclavian artery. Mild engorgement of central pulmonary vasculature. Limited assessment of cardiovascular structures given lack of intravenous contrast. Mediastinum/Nodes: Tracheostomy tube terminating in the proximal trachea. No signs of adenopathy in the chest. No acute process in the mediastinum. Esophagus is grossly normal. Lungs/Pleura: Patchy airspace disease in the chest in LEFT upper lobe, lingula, LEFT lower lobe and RIGHT lung base. Volume loss in the LEFT lung base associated also with consolidative changes and material in bronchial structures. No pneumothorax. Trace pleural effusions. Airways are otherwise patent. Upper  Abdomen: Incidental imaging of  upper abdominal contents without acute process to the extent evaluated. Musculoskeletal: Spinal degenerative changes. No acute or destructive bone process. Rib fractures of varying ages, no acute fractures most are chronic or subacute and without displacement. Fracture at the T9 level with 20-30% loss of height. Vacuum phenomenon within the fracture site. No surrounding stranding. T11 with loss of height as well. These have occurred since December of 2021. No listhesis. IMPRESSION: 1. Multifocal pneumonia with acute on chronic sequela of aspiration favored at the LEFT lung base. 2. Age-indeterminate compression fractures potentially subacute/recent at the T9 level and T11 level are new since 2021. Correlate with any history of prior fracture to these areas and with any current symptoms. 3. Aortic atherosclerosis and coronary artery disease. Aortic Atherosclerosis (ICD10-I70.0). Electronically Signed   By: Zetta Bills M.D.   On: 01/04/2022 17:23   DG Chest Portable 1 View  Result Date: 01/03/2022 CLINICAL DATA:  Provided history: Shortness of breath. EXAM: PORTABLE CHEST 1 VIEW COMPARISON:  Prior chest radiographs 08/08/2021 and earlier. FINDINGS: A tracheostomy tube is noted. Cardiomegaly. Central pulmonary vascular congestion. Prominence of the interstitial lung markings bilaterally, compatible with pulmonary interstitial edema. Irregular and nodular opacities within the right lung base. The nodular opacities measure up to 16 mm. Irregular opacities within the left lung base. No evidence of pneumothorax. No acute bony abnormality identified. IMPRESSION: Cardiomegaly with pulmonary interstitial edema. Opacities within bilateral lung bases, which may reflect atelectasis and/or pneumonia. Superimposed more nodular opacities within the right lung base. A chest CT is recommended to better assess for possible pulmonary nodules at this site. Small left pleural effusion, which may be partially loculated.  Electronically Signed   By: Kellie Simmering D.O.   On: 01/03/2022 14:50   US Venous Img Lower Bilateral  Result Date: 01/03/2022 CLINICAL DATA:  Bilateral open wounds. EXAM: BILATERAL LOWER EXTREMITY VENOUS DOPPLER ULTRASOUND TECHNIQUE: Gray-scale sonography with graded compression, as well as color Doppler and duplex ultrasound were performed to evaluate the lower extremity deep venous systems from the level of the common femoral vein and including the common femoral, femoral, profunda femoral, popliteal and calf veins including the posterior tibial, peroneal and gastrocnemius veins when visible. The superficial great saphenous vein was also interrogated. Spectral Doppler was utilized to evaluate flow at rest and with distal augmentation maneuvers in the common femoral, femoral and popliteal veins. COMPARISON:  None Available. FINDINGS: RIGHT LOWER EXTREMITY Common Femoral Vein: No evidence of thrombus. Normal compressibility, respiratory phasicity and response to augmentation. Saphenofemoral Junction: No evidence of thrombus. Normal compressibility and flow on color Doppler imaging. Profunda Femoral Vein: No evidence of thrombus. Normal compressibility and flow on color Doppler imaging. Femoral Vein: No evidence of thrombus. Normal compressibility, respiratory phasicity and response to augmentation. Popliteal Vein: No evidence of thrombus. Normal compressibility, respiratory phasicity and response to augmentation. Calf Veins: Limited evaluation. Superficial Great Saphenous Vein: Limited evaluation. Other Findings:  Large amount of subcutaneous edema. LEFT LOWER EXTREMITY Common Femoral Vein: No evidence of thrombus. Normal compressibility, respiratory phasicity and response to augmentation. Saphenofemoral Junction: No evidence of thrombus. Normal compressibility and flow on color Doppler imaging. Profunda Femoral Vein: No evidence of thrombus. Normal compressibility and flow on color Doppler imaging. Femoral Vein:  No evidence of thrombus. Normal compressibility, respiratory phasicity and response to augmentation. Popliteal Vein: No evidence of thrombus. Normal compressibility, respiratory phasicity and response to augmentation. Calf Veins: Limited evaluation. Superficial Great Saphenous Vein: No evidence of thrombus. Normal compressibility. Other Findings:  Subcutaneous edema. IMPRESSION: No evidence of deep venous thrombosis in either lower extremity. However, the study has technical limitations due to body habitus and edema. Limited evaluation the bilateral deep calf veins. Electronically Signed   By: Markus Daft M.D.   On: 01/03/2022 13:45    [name], MD  [facility]  If 7PM-7AM, please contact night-coverage [email or web address] Password [email or web address] 01/31/2022, 1:12 PM   LOS: 17 days

## 2022-02-01 MED ORDER — ALBUTEROL SULFATE (2.5 MG/3ML) 0.083% IN NEBU: 2.5000 mg | INHALATION_SOLUTION | RESPIRATORY_TRACT | 12 refills | Status: AC | PRN

## 2022-02-01 MED ORDER — SCOPOLAMINE 1 MG/3DAYS TD PT72: 1.0000 | MEDICATED_PATCH | TRANSDERMAL | 12 refills | Status: AC

## 2022-02-01 NOTE — Progress Notes (Addendum)
[facility]) Hospital Liaison Note  Received request from Iona Beard, [facility] for family interest in [place]. Spoke with patient's son, Gerald Stabs, to confirm interest and explain services. Eligibility confirmed per [facility] MD. Gerald Stabs is agreeable to transfer today.   Iona Beard, TOC aware.  RN, please call report to [phone] prior to patient leaving the unit.  Please send signed and completed DNR with patient at discharge.   Thank you,  Zigmund Gottron RN  [facility] Liaison [phone]

## 2022-02-01 NOTE — Plan of Care (Signed)
  Problem: Clinical Measurements: Goal: Cardiovascular complication will be avoided Outcome: Progressing   

## 2022-02-01 NOTE — Progress Notes (Signed)
Patient stable and discharging to hospice home. Writer called report spoke with stephanie and she verbalized understanding. Patient discharging with right FA IV and foley catheter. Writer sent trach supplies with patient.

## 2022-02-01 NOTE — TOC Transition Note (Signed)
Transition of Care [facility]) - CM/SW Discharge Note   Patient Details  Name: Veronica Horton MRN: [id number] Date of Birth: [birth year]  Transition of Care [facility]) CM/SW Contact:  Iona Beard, [place] Phone Number: 02/01/2022, 3:47 PM   Clinical Narrative:    CSW updated that [facility] has approved pt for residential hospice admission. Pt can arrive to the [place] in [place] after 5pm. CSW placed pt on EMS list at this time. MD and RN updated. Pts son aware and agreeable to plan. TOC signing off.   Final next level of care: [place] Barriers to Discharge: Barriers Resolved   Patient Goals and CMS Choice Patient states their goals for this hospitalization and ongoing recovery are:: residential hospice CMS Medicare.gov Compare Post Acute Care list provided to:: Patient Represenative (must comment) Choice offered to / list presented to : Adult Children  Discharge Placement                       Discharge Plan and Services In-house Referral: Clinical Social Work   Post Acute Care Choice: [place]                               Social Determinants of Health (SDOH) Interventions     Readmission Risk Interventions    08/09/2021    2:37 PM 04/26/2020   10:28 AM  Readmission Risk Prevention Plan  Transportation Screening Complete Complete  Medication Review ([place]) Complete Complete  PCP or Specialist appointment within 3-5 days of discharge  Complete  HRI or [place] Complete Complete  SW Recovery Care/Counseling Consult Complete Complete  Palliative Care Screening Not Applicable Not [place] Not Applicable Patient Refused

## 2022-02-01 NOTE — Discharge Summary (Signed)
Veronica Horton, is a 61 y.o. female  DOB 1961/01/10  MRN [id number].  Admission date:  01/14/2022  Admitting Physician  Rolla Plate, DO  Discharge Date:  02/01/2022   Primary MD  [facility]  Recommendations for primary care physician for things to follow:  Transfer to residential hospice house  Admission Diagnosis  Acute respiratory failure with hypoxia ([place]) [J96.01] Dysphagia, unspecified type [R13.10]   Discharge Diagnosis  Acute respiratory failure with hypoxia ([place]) [J96.01] Dysphagia, unspecified type [R13.10]    Active Problems:   Aspiration pneumonitis (HCC)   Hypokalemia   COPD (chronic obstructive pulmonary disease) ([place])   Pneumonia   Seizure disorder ([place])   DVT (deep venous thrombosis) ([place])   Acute renal failure superimposed on stage 3b chronic kidney disease ([name])   Acute heart failure with preserved ejection fraction (HFpEF) (HCC)   Acute respiratory failure with hypoxia (HCC)   Thyroid disease   Chronic anemia   Aspiration into airway   Dysphagia   Acute metabolic encephalopathy      Past Medical History:  Diagnosis Date   Aspiration pneumonitis ([place]) 01/17/2019   Bronchitis    Class 3 obesity ([place]) 01/23/2020   COPD (chronic obstructive pulmonary disease) ([place])    Degenerative disc disease, lumbar    DVT (deep venous thrombosis) ([place]) 08/08/2021   Hiatal hernia    Hypertension    Seizure ([place]) 11/28/2020   Small bowel obstruction ([place]) 01/13/2019   Throat cancer ([place])     Past Surgical History:  Procedure Laterality Date   BIOPSY  04/12/2020   Procedure: BIOPSY;  Surgeon: Wilford Corner, MD;  Location: [facility];  Service: Gastroenterology;;   CHOLECYSTECTOMY     degenerative bone disease     DIRECT LARYNGOSCOPY N/A 03/13/2020   Procedure: DIRECT LARYNGOSCOPY WITH BIOPSY;  Surgeon: Jason Coop, DO;  Location:  [place];  Service: ENT;  Laterality: N/A;   FLEXIBLE SIGMOIDOSCOPY N/A 04/12/2020   Procedure: Beryle Quant;  Surgeon: Wilford Corner, MD;  Location: [facility];  Service: Gastroenterology;  Laterality: N/A;   INCISIONAL HERNIA REPAIR N/A 01/15/2019   Procedure: [name] HERNIORRHAPHY WITH MESH;  Surgeon: [name], MD;  Location: [facility];  Service: General;  Laterality: N/A;   IR GASTROSTOMY TUBE MOD SED  03/22/2020   IR [place] TUBE CHANGE  07/24/2020   IR RADIOLOGIST EVAL & MGMT  05/07/2020   IR REPLACE G-TUBE SIMPLE WO FLUORO  11/30/2020   OMENTECTOMY N/A 01/15/2019   Procedure: OMENTECTOMY;  Surgeon: [name], MD;  Location: [facility];  Service: General;  Laterality: N/A;   POLYPECTOMY  04/12/2020   Procedure: POLYPECTOMY;  Surgeon: Wilford Corner, MD;  Location: [facility];  Service: Gastroenterology;;   TRACHEOSTOMY TUBE PLACEMENT N/A 03/13/2020   Procedure: AWAKE TRACHEOSTOMY;  Surgeon: Jason Coop, DO;  Location: [facility];  Service: ENT;  Laterality: N/A;       HPI  from the history and physical done on the day of admission:   HPI: Veronica  ANUREET Horton is a 61 y.o. female with medical history significant of aspiration pneumonitis, bronchitis, class III obesity, COPD, DVT, hypertension, seizure, laryngeal cancer status post trach, presents to the ED from facility for vomiting.  Patient was recently discharged from the hospital on September 11.  She has been seen in the hospitals several times recently.  She was in [facility] are with lower extremity edema and pain.  She was discharged home from the ED with clindamycin for which she reports she finished the course.  She continued to have left lower extremity pain and edema.  During her hospitalization here she was started on vancomycin for lower extremity cellulitis.  Patient had a UA that was indicative of UTI and she was started on Rocephin to cover that an aspiration pneumonia.  Her hospitalization was complicated with fluid  overload secondary to diastolic CHF for which she was started on IV Lasix and showed improvement.  She had an echo done that showed an ejection fraction of 65-70% as well.  Communication with patient is difficult given that she mouths words at you.  She reports she came in today because she could not breathe.  This started last night.  She has had increase in sputum production.  She denies chest pain.  Apparently was also reported to the ED staff the patient had more nausea and vomiting at the facility.  Patient denies any fevers.  Her only other complaint at this time is chronic back pain.   Patient is DNR, not getting treatment for her laryngeal cancer, and following with palliative. Review of Systems: As mentioned in the history of present illness. All other systems reviewed and are negative.     Hospital Course:     61 year old female with a history of COPD, tobacco abuse, chronic respiratory failure with tracheostomy on 6-8 L at home, laryngeal cancer status post tracheostomy, seizure disorder, GERD, hypertension, orthostatic hypotension presenting with generalized weakness and increasing left lower extremity redness and pain.  The patient visited the emergency department at [facility] on 12/27/2026 with lower extremity edema and pain.  The patient was discharged home from the ED with clindamycin with which she had been compliant.  She continued to have left lower extremity pain and edema.  She stated that she had her left leg against the wall approximately 1 week prior to this admission.  She denies any other recent injuries or falls.  She uses a walker at baseline.  She continues to smoke 1/2 pack/day Although the patient did not formally leave [facility], she aggressively advocated for discharge on 01/09/2022. On 01/10/2022, the patient slid out of bed and went to the ED at [facility].  She refused any blood work and was subsequently discharged home. She presented again on 01/14/2022 with shortness  of breath with nausea and vomiting.  Chest x-ray showed increased interstitial markings with moderate left pleural effusion and small right pleural effusion.  The patient was started on Lasix.  She was also started on vancomycin and cefepime for pneumonia.  Since admission to the hospital, the patient's condition has waxed and waned without much improvement.  Her mentation also has been waxing and waning.  Palliative medicine was consulted.  Pulmonary medicine was consulted.  She was started antibiotics and lasix.  Although she remained afebrile and hemodynamically stable, her overall medical condition continued to deteriorate with decline in mental status and refusing po intake.  [place] discussions were held with sister and boyfriend.  They abdicated further decision making to patient's  son.  [name] was contacted and favors a more comfort focus approach.  Hospital stay prolonged due to inability to find accepting residential hospice facility.  After speaking with son, he was agreeable for SNF placement with hospice services via [facility].  Assessment and Plan: Acute on chronic respiratory failure with hypoxia and hypercarbia -Multifactorial including aspiration pneumonitis, COPD, fluid overload -The patient is noncompliant with her prescribed diet -Currently stable on 10 L; (She is chronically on 8 L at home) -after [place] discussion with patient and son>>goal now is to focus on comfort measure hopeful for transition to residential hospice. -will add scopolamine patch to help with secretions.    Aspiration pneumonitis (HCC) CT chest on 01/04/2022 showed Multifocal pneumonia with acute on chronic sequela of aspiration 01/16/22 Speech therapy eval>>dys 3 with thin -MRSA+ -on vanc/cefepime--had 6 days -stopped cefepime due to altered mental status --after [place] discussion with patient and son>>goal now is to focus on comfort measure hopeful for transition to residential hospice   Acute on chronic  diastolic CHF (congestive heart failure) ([place]) Clinically fluid overloaded initially with pleural effusions and interstitial edema transition to IV lasix as pt intermittently refuses pills>>d/c 9/22 up to uptrending creatinine and euvolemia -01/08/22 echo--EF 65 to 70%, no WMA. Normal RV; trivial TR -after [place] discussion with patient and son>>goal now is to focus on comfort measure hopeful for transition to residential hospice   Hypokalemia -repleted -last mag level 1.9 -Focusing on comfort care and symptomatic management only. -No further bloodwork to be done with intention to follow electrolytes trend.   Acute renal failure superimposed on stage 3b chronic kidney disease ([place]) Baseline creatinine 1.6-1.8 Presented with serum creatinine 2.44 Now fluid overloaded>>started IV lasix initially>>stop on 9/22 -after [place] discussion with patient and son>>goal now is to focus on comfort measure hopeful for transition to residential hospice. -No further blood work anticipated; continue symptomatic management only.   Acute metabolic Encephalopathy -Lethargic but peaceful - CT brain--No significant interval change in the size of the right parietal mass with associated mass effect and edema -MR brain--3.7 cm right parietal convexity meningioma with associated vasogenic edema, similar to prior; no other acute findings. -Continue supportive care and symptomatic management. -Transfer to residential hospice on 02/01/2022   Laryngeal cancer ([place]) - -Previously declined further treatment -She is DNR. -after [place] discussion with patient and son>>goal now is to focus on comfort measure hopeful for transition to residential hospice VS SNF with hospice folow up --Transfer to residential hospice on 02/01/2022   Tracheostomy status ([place]) CHRONIC RESPIRATORY FAILURE WITH HYPOXIA - ON 6-8 L AT HOME --Transfer to residential hospice on 02/01/2022   Hypertension -Transfer to residential hospice on 02/01/2022.    Goals of Care DNR/DNI --Transfer to residential hospice on 02/01/2022   [place] damaged skin -Continue pattern -Foley catheter placement to prevent breakdown comfort care been ordered.   Discharge Condition: -Transfer to residential hospice on 02/01/2022   Consults obtained -palliative care and hospice  Diet and Activity recommendation:  As advised  Discharge Instructions    Discharge Instructions     Call MD for:  difficulty breathing, headache or visual disturbances   Complete by: As directed    Call MD for:  persistant nausea and vomiting   Complete by: As directed    Call MD for:  severe uncontrolled pain   Complete by: As directed    Call MD for:  temperature >100.4   Complete by: As directed    Diet general   Complete by:  As directed    Discharge instructions   Complete by: As directed    -- Transfer to residential hospice sounds   Increase activity slowly   Complete by: As directed    No wound care   Complete by: As directed         Discharge Medications     Allergies as of 02/01/2022       Reactions   Codeine Hives, Itching   Reports itching only per RN   Doxycycline Hives   Penicillins Hives   Did it involve swelling of the face/tongue/throat, SOB, or low BP? No Did it involve sudden or severe rash/hives, skin peeling, or any reaction on the inside of your mouth or nose? Yes Did you need to seek medical attention at a hospital or doctor's office? Unknown When did it last happen?      Over 10 years If all above answers are "NO", may proceed with cephalosporin use.        Medication List     STOP taking these medications    apixaban 5 MG Tabs tablet Commonly known as: ELIQUIS   cefdinir 300 MG capsule Commonly known as: OMNICEF   clindamycin 300 MG capsule Commonly known as: CLEOCIN   cyanocobalamin 1000 MCG tablet Commonly known as: VITAMIN B12   D3 Super Strength 50 MCG (2000 UT) Caps Generic drug: Cholecalciferol   escitalopram 5  MG tablet Commonly known as: LEXAPRO   folic acid 1 MG tablet Commonly known as: FOLVITE   furosemide 40 MG tablet Commonly known as: LASIX   gabapentin 300 MG capsule Commonly known as: NEURONTIN   ipratropium-albuterol 0.5-2.5 (3) MG/3ML Soln Commonly known as: DUONEB   levETIRAcetam 100 MG/ML solution Commonly known as: KEPPRA   levothyroxine 150 MCG tablet Commonly known as: SYNTHROID   lidocaine 5 % Commonly known as: LIDODERM   magic mouthwash w/lidocaine Soln   metoprolol tartrate 25 MG tablet Commonly known as: LOPRESSOR   midodrine 5 MG tablet Commonly known as: PROAMATINE   oxyCODONE 5 MG immediate release tablet Commonly known as: Oxy IR/ROXICODONE   polyethylene glycol powder 17 GM/SCOOP powder Commonly known as: GLYCOLAX/MIRALAX   potassium chloride 20 MEQ/15ML (10%) Soln   Ventolin HFA 108 (90 Base) MCG/ACT inhaler Generic drug: albuterol Replaced by: albuterol (2.5 MG/3ML) 0.083% nebulizer solution       TAKE these medications    albuterol (2.5 MG/3ML) 0.083% nebulizer solution Commonly known as: PROVENTIL Take 3 mLs (2.5 mg total) by nebulization every 4 (four) hours as needed for wheezing or shortness of breath. Replaces: Ventolin HFA 108 (90 Base) MCG/ACT inhaler   budesonide 0.5 MG/2ML nebulizer solution Commonly known as: PULMICORT Take 2 mLs (0.5 mg total) by nebulization 2 (two) times daily.   calcitRIOL 0.25 MCG capsule Commonly known as: ROCALTROL Take 0.25 mcg by mouth daily.   Gerhardt's butt cream Crea Apply 1 application. topically 3 (three) times daily as needed for irritation.   scopolamine 1 MG/3DAYS Commonly known as: TRANSDERM-SCOP Place 1 patch (1.5 mg total) onto the skin every 3 (three) days. Start taking on: February 04, 2022       Major procedures and Radiology Reports - PLEASE review detailed and final reports for all details, in brief -   MR BRAIN WO CONTRAST  Result Date: 01/20/2022 CLINICAL DATA:   Initial evaluation for mental status change. EXAM: MRI HEAD WITHOUT CONTRAST TECHNIQUE: Multiplanar, multiecho pulse sequences of the brain and surrounding structures were obtained without intravenous contrast. COMPARISON:  Prior CT from 01/19/2022. FINDINGS: Brain: Examination severely degraded by motion artifact, and is nearly nondiagnostic. Cerebral volume within normal limits. No visible foci of restricted diffusion to suggest acute or subacute ischemia. No visible acute or chronic intracranial blood products. Previously identified meningioma centered at the right parietal convexity again seen, measuring up to approximately 3.7 cm in size. Associated regional mass effect with vasogenic edema, similar to prior CT. No other visible mass lesion or mass effect. No significant midline shift. Ventricles grossly within normal limits for size without hydrocephalus. No extra-axial fluid collection. Pituitary gland suprasellar region grossly within normal limits. Vascular: Major intracranial vascular flow voids are grossly maintained at the skull base, although poorly assessed due to significant motion. Skull and upper cervical spine: Craniocervical junction within normal limits. Bone marrow signal intensity grossly normal. No scalp soft tissue abnormality. Sinuses/Orbits: Globes and orbital soft tissues grossly within normal limits. Paranasal sinuses are largely clear. No obvious significant mastoid effusion. Other: None. IMPRESSION: 1. Severely motion degraded and nearly nondiagnostic examination. 2. 3.7 cm right parietal convexity meningioma with associated vasogenic edema, similar to prior. 3. No other definite acute intracranial abnormality. Electronically Signed   By: Jeannine Boga M.D.   On: 01/20/2022 23:02   CT HEAD WO CONTRAST (5MM)  Result Date: 01/19/2022 CLINICAL DATA:  Altered mental status. EXAM: CT HEAD WITHOUT CONTRAST TECHNIQUE: Contiguous axial images were obtained from the base of the skull  through the vertex without intravenous contrast. RADIATION DOSE REDUCTION: This exam was performed according to the departmental dose-optimization program which includes automated exposure control, adjustment of the mA and/or kV according to patient size and/or use of iterative reconstruction technique. COMPARISON:  Head CT dated 11/28/2020. FINDINGS: Evaluation of this exam is limited due to motion artifact. 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 This is relatively unchanged compared to prior CT. There is associated mass effect and edema in the right parietal lobe also similar to prior CT. There is no acute intracranial hemorrhage. No midline shift. No extra-axial fluid collection. Vascular: No hyperdense vessel or unexpected calcification. Skull: Normal. Negative for fracture or focal lesion. Sinuses/Orbits: No acute finding. Other: None IMPRESSION: 1. No acute intracranial hemorrhage. 2. No significant interval change in the size of the right parietal mass with associated mass effect and edema. Electronically Signed   By: Anner Crete M.D.   On: 01/19/2022 18:31   DG CHEST PORT 1 VIEW  Result Date: 01/18/2022 CLINICAL DATA:  [id number].  Shortness of breath. EXAM: PORTABLE CHEST 1 VIEW COMPARISON:  Portable 01/16/2022 at 8:37 a.m. FINDINGS: 4:56 a.m. An oxygen mask superimposes over the left apical area. There is a tracheostomy cannula with the tip 5.4 cm from the carina. There is a low inspiration. There is patchy consolidation, atelectasis or combination in the lower lung fields overlying small pleural effusions. The upper lung fields remain clear. No new or worsened opacity is seen. There is mild cardiomegaly without CHF. Stable mediastinum with mild aortic atherosclerosis. Degenerative change thoracic spine and asymmetrically of the right shoulder. IMPRESSION: There previously was  interstitial edema which is not seen today. Bibasilar opacities, atelectasis and/or pneumonia, and small underlying pleural effusions appear similar. Overall aeration is unchanged. Electronically Signed   By: Telford Nab M.D.   On: 01/18/2022 06:02   DG Chest Port 1 View  Result Date: 01/16/2022 CLINICAL DATA:  Respiratory distress EXAM: PORTABLE CHEST 1  VIEW COMPARISON:  Chest radiograph 01/15/2019 FINDINGS: Increased bilateral pleural effusions, left-greater-than-right. Right basilar linear and left basilar consolidative opacities may represent atelectasis, but superimposed infection is difficult to exclude. Increased interstitial opacities and peribronchial wall thickening are suggestive of mild pulmonary edema. There is a tracheostomy in place. Visualized upper abdomen is unremarkable. No acute osseous abnormality. IMPRESSION: New mild pulmonary edema with moderate left and small right pleural effusions. Bibasilar opacities may represent atelectasis in the setting of worsening pleural effusions, but a component of superimposed infection is difficult to exclude. Electronically Signed   By: Marin Roberts M.D.   On: 01/16/2022 09:01   DG Chest Port 1 View  Result Date: 01/14/2022 CLINICAL DATA:  sob EXAM: PORTABLE CHEST 1 VIEW.  Patient is rotated. COMPARISON:  Chest x-ray 01/03/2022, CT chest 01/04/2022 FINDINGS: Tracheostomy tube with tip terminating 8.5 cm above the carina. The heart and mediastinal contours are grossly unchanged. Low lung volumes persistent retrocardiac airspace opacity. No pulmonary edema. Similar-appearing blunting of bilateral costophrenic angles with no significant pleural effusion. No pneumothorax. No acute osseous abnormality. IMPRESSION: 1. Low lung volumes, persistent retrocardiac airspace opacity with limited evaluation due to patient rotation. 2. Similar-appearing blunting of bilateral costophrenic angles with no significant pleural effusion. Electronically Signed   By:  Iven Finn M.D.   On: 01/14/2022 19:49   ECHOCARDIOGRAM COMPLETE  Result Date: 01/07/2022    ECHOCARDIOGRAM REPORT   Patient Name:   Veronica Horton Date of Exam: 01/07/2022 Medical Rec #:  [id number]       Height:       65.5 in Accession #:    [id number]      Weight:       255.0 lb Date of Birth:  [birth year]       BSA:          2.206 m Patient Age:    28 years        BP:           99/48 mmHg Patient Gender: F               HR:           72 bpm. Exam Location:  [facility] Procedure: 2D Echo, Color Doppler, Cardiac Doppler and Intracardiac            Opacification Agent Indications:    H37.16 Acute diastolic (congestive) heart failure  History:        Patient has prior history of Echocardiogram examinations, most                 recent 04/19/2021. CHF, Arrythmias:Atrial Fibrillation; Risk                 Factors:Hypertension, Diabetes, Dyslipidemia and Current Smoker.  Sonographer:    Raquel Sarna Senior RDCS Referring Phys: [phone] DAVID TAT  Sonographer Comments: Technically difficult due to patient body habitus. Patient is morbidly obese. IMPRESSIONS  1. Left ventricular ejection fraction, by estimation, is 65 to 70%. The left ventricle has normal function. The left ventricle has no regional wall motion abnormalities. Left ventricular diastolic parameters were normal.  2. Right ventricular systolic function is normal. The right ventricular size is normal. Tricuspid regurgitation signal is inadequate for assessing PA pressure.  3. The mitral valve is normal in structure. No evidence of mitral valve regurgitation. No evidence of mitral stenosis.  4. The aortic valve was not well visualized. Aortic valve regurgitation is not visualized. No aortic stenosis is present.  5. The inferior  vena cava is dilated in size with <50% respiratory variability, suggesting right atrial pressure of 15 mmHg. FINDINGS  Left Ventricle: Left ventricular ejection fraction, by estimation, is 65 to 70%. The left ventricle has normal function.  The left ventricle has no regional wall motion abnormalities. Definity contrast agent was given IV to delineate the left ventricular  endocardial borders. The left ventricular internal cavity size was normal in size. There is no left ventricular hypertrophy. Left ventricular diastolic parameters were normal. Right Ventricle: The right ventricular size is normal. Right vetricular wall thickness was not well visualized. Right ventricular systolic function is normal. Tricuspid regurgitation signal is inadequate for assessing PA pressure. Left Atrium: Left atrial size was normal in size. Right Atrium: Right atrial size was normal in size. Pericardium: There is no evidence of pericardial effusion. Mitral Valve: The mitral valve is normal in structure. No evidence of mitral valve regurgitation. No evidence of mitral valve stenosis. Tricuspid Valve: The tricuspid valve is not well visualized. Tricuspid valve regurgitation is trivial. No evidence of tricuspid stenosis. Aortic Valve: The aortic valve was not well visualized. Aortic valve regurgitation is not visualized. No aortic stenosis is present. Aortic valve mean gradient measures 4.0 mmHg. Aortic valve peak gradient measures 9.3 mmHg. Aortic valve area, by VTI measures 2.33 cm. Pulmonic Valve: The pulmonic valve was not well visualized. Pulmonic valve regurgitation is not visualized. No evidence of pulmonic stenosis. Aorta: The aortic root is normal in size and structure. Venous: The inferior vena cava is dilated in size with less than 50% respiratory variability, suggesting right atrial pressure of 15 mmHg. IAS/Shunts: The interatrial septum was not well visualized.  LEFT VENTRICLE PLAX 2D LVIDd:         4.60 cm LVIDs:         2.50 cm LV PW:         0.80 cm LV IVS:        1.00 cm LVOT diam:     1.80 cm LV SV:         67 LV SV Index:   30 LVOT Area:     2.54 cm  RIGHT VENTRICLE RV S prime:     13.30 cm/s TAPSE (M-mode): 2.0 cm LEFT ATRIUM             Index         RIGHT ATRIUM           Index LA diam:        3.60 cm 1.63 cm/m   RA Area:     20.30 cm LA Vol (A2C):   45.4 ml 20.58 ml/m  RA Volume:   64.40 ml  29.20 ml/m LA Vol (A4C):   63.1 ml 28.61 ml/m LA Biplane Vol: 54.0 ml 24.48 ml/m  AORTIC VALVE AV Area (Vmax):    2.06 cm AV Area (Vmean):   2.15 cm AV Area (VTI):     2.33 cm AV Vmax:           152.23 cm/s AV Vmean:          92.193 cm/s AV VTI:            0.289 m AV Peak Grad:      9.3 mmHg AV Mean Grad:      4.0 mmHg LVOT Vmax:         123.00 cm/s LVOT Vmean:        78.000 cm/s LVOT VTI:          0.264 m  LVOT/AV VTI ratio: 0.91  AORTA Ao Root diam: 3.00 cm Ao Asc diam:  2.90 cm  SHUNTS Systemic VTI:  0.26 m Systemic Diam: 1.80 cm Carlyle Dolly MD Electronically signed by Carlyle Dolly MD Signature Date/Time: 01/07/2022/3:23:07 PM    Final    CT CHEST WO CONTRAST  Result Date: 01/04/2022 CLINICAL DATA:  A 61 year old female with chronic dyspnea presents for evaluation in the setting of abnormal chest radiograph. EXAM: CT CHEST WITHOUT CONTRAST TECHNIQUE: Multidetector CT imaging of the chest was performed following the standard protocol without IV contrast. RADIATION DOSE REDUCTION: This exam was performed according to the departmental dose-optimization program which includes automated exposure control, adjustment of the mA and/or kV according to patient size and/or use of iterative reconstruction technique. COMPARISON:  December of 2021 FINDINGS: Cardiovascular: Scattered aortic atherosclerosis. Three-vessel coronary artery disease. Normal heart size without pericardial effusion or nodularity. Aberrant RIGHT subclavian artery. Mild engorgement of central pulmonary vasculature. Limited assessment of cardiovascular structures given lack of intravenous contrast. Mediastinum/Nodes: Tracheostomy tube terminating in the proximal trachea. No signs of adenopathy in the chest. No acute process in the mediastinum. Esophagus is grossly normal. Lungs/Pleura: Patchy  airspace disease in the chest in LEFT upper lobe, lingula, LEFT lower lobe and RIGHT lung base. Volume loss in the LEFT lung base associated also with consolidative changes and material in bronchial structures. No pneumothorax. Trace pleural effusions. Airways are otherwise patent. Upper Abdomen: Incidental imaging of upper abdominal contents without acute process to the extent evaluated. Musculoskeletal: Spinal degenerative changes. No acute or destructive bone process. Rib fractures of varying ages, no acute fractures most are chronic or subacute and without displacement. Fracture at the T9 level with 20-30% loss of height. Vacuum phenomenon within the fracture site. No surrounding stranding. T11 with loss of height as well. These have occurred since December of 2021. No listhesis. IMPRESSION: 1. Multifocal pneumonia with acute on chronic sequela of aspiration favored at the LEFT lung base. 2. Age-indeterminate compression fractures potentially subacute/recent at the T9 level and T11 level are new since 2021. Correlate with any history of prior fracture to these areas and with any current symptoms. 3. Aortic atherosclerosis and coronary artery disease. Aortic Atherosclerosis (ICD10-I70.0). Electronically Signed   By: Zetta Bills M.D.   On: 01/04/2022 17:23   DG Chest Portable 1 View  Result Date: 01/03/2022 CLINICAL DATA:  Provided history: Shortness of breath. EXAM: PORTABLE CHEST 1 VIEW COMPARISON:  Prior chest radiographs 08/08/2021 and earlier. FINDINGS: A tracheostomy tube is noted. Cardiomegaly. Central pulmonary vascular congestion. Prominence of the interstitial lung markings bilaterally, compatible with pulmonary interstitial edema. Irregular and nodular opacities within the right lung base. The nodular opacities measure up to 16 mm. Irregular opacities within the left lung base. No evidence of pneumothorax. No acute bony abnormality identified. IMPRESSION: Cardiomegaly with pulmonary interstitial  edema. Opacities within bilateral lung bases, which may reflect atelectasis and/or pneumonia. Superimposed more nodular opacities within the right lung base. A chest CT is recommended to better assess for possible pulmonary nodules at this site. Small left pleural effusion, which may be partially loculated. Electronically Signed   By: Kellie Simmering D.O.   On: 01/03/2022 14:50   US Venous Img Lower Bilateral  Result Date: 01/03/2022 CLINICAL DATA:  Bilateral open wounds. EXAM: BILATERAL LOWER EXTREMITY VENOUS DOPPLER ULTRASOUND TECHNIQUE: Gray-scale sonography with graded compression, as well as color Doppler and duplex ultrasound were performed to evaluate the lower extremity deep venous systems from the level of the common femoral  vein and including the common femoral, femoral, profunda femoral, popliteal and calf veins including the posterior tibial, peroneal and gastrocnemius veins when visible. The superficial great saphenous vein was also interrogated. Spectral Doppler was utilized to evaluate flow at rest and with distal augmentation maneuvers in the common femoral, femoral and popliteal veins. COMPARISON:  None Available. FINDINGS: RIGHT LOWER EXTREMITY Common Femoral Vein: No evidence of thrombus. Normal compressibility, respiratory phasicity and response to augmentation. Saphenofemoral Junction: No evidence of thrombus. Normal compressibility and flow on color Doppler imaging. Profunda Femoral Vein: No evidence of thrombus. Normal compressibility and flow on color Doppler imaging. Femoral Vein: No evidence of thrombus. Normal compressibility, respiratory phasicity and response to augmentation. Popliteal Vein: No evidence of thrombus. Normal compressibility, respiratory phasicity and response to augmentation. Calf Veins: Limited evaluation. Superficial Great Saphenous Vein: Limited evaluation. Other Findings:  Large amount of subcutaneous edema. LEFT LOWER EXTREMITY Common Femoral Vein: No evidence of  thrombus. Normal compressibility, respiratory phasicity and response to augmentation. Saphenofemoral Junction: No evidence of thrombus. Normal compressibility and flow on color Doppler imaging. Profunda Femoral Vein: No evidence of thrombus. Normal compressibility and flow on color Doppler imaging. Femoral Vein: No evidence of thrombus. Normal compressibility, respiratory phasicity and response to augmentation. Popliteal Vein: No evidence of thrombus. Normal compressibility, respiratory phasicity and response to augmentation. Calf Veins: Limited evaluation. Superficial Great Saphenous Vein: No evidence of thrombus. Normal compressibility. Other Findings:  Subcutaneous edema. IMPRESSION: No evidence of deep venous thrombosis in either lower extremity. However, the study has technical limitations due to body habitus and edema. Limited evaluation the bilateral deep calf veins. Electronically Signed   By: Markus Daft M.D.   On: 01/03/2022 13:45    Micro Results   Today   Subjective    Veronica Horton today has no fevers -Very lethargic but appears comfortable --Transfer to residential hospice on 02/01/2022   Patient has been seen and examined prior to discharge   Objective   Blood pressure 124/71, pulse (!) 106, temperature 98.4 F (36.9 C), temperature source Oral, resp. rate 18, height 5' 5.5" (1.664 m), weight 120.9 kg, SpO2 91 %.   Intake/Output Summary (Last 24 hours) at 02/01/2022 1614 Last data filed at 01/31/2022 1700 Gross per 24 hour  Intake 0 ml  Output --  Net 0 ml    Exam Gen:-Very lethargic, appears comfortable HEENT:- [place].AT, No sclera icterus Neck-trach with trach collar Lungs-lung sounds better after Robinul and suctioning and breathing treatments  CV- S1, S2 normal, regular Abd-  +ve B.Sounds, Abd Soft, No tenderness,    NeuroPsych-lethargic, mostly unresponsive, appears comfortable  Data Review   CBC w Diff:  Lab Results  Component Value Date   WBC 6.4 01/22/2022    HGB 10.0 (L) 01/22/2022   HGB 9.8 (L) 05/10/2020   HCT 33.1 (L) 01/22/2022   PLT 324 01/22/2022   PLT 179 05/10/2020   LYMPHOPCT 11 01/15/2022   MONOPCT 7 01/15/2022   EOSPCT 1 01/15/2022   BASOPCT 1 01/15/2022    CMP:  Lab Results  Component Value Date   NA 141 01/22/2022   K 2.9 (L) 01/22/2022   CL 104 01/22/2022   CO2 26 01/22/2022   BUN 31 (H) 01/22/2022   CREATININE 3.39 (H) 01/22/2022   CREATININE 1.39 (H) 05/10/2020   PROT 6.5 01/21/2022   ALBUMIN 2.5 (L) 01/21/2022   BILITOT 1.0 01/21/2022   BILITOT 0.5 05/10/2020   ALKPHOS 104 01/21/2022   AST 16 01/21/2022   AST 9 (L)  05/10/2020   ALT 12 01/21/2022   ALT 9 05/10/2020  .  Total Discharge time is about 33 minutes  Roxan Hockey M.D on 02/01/2022 at 4:14 PM  Go to [email or web address] -  for contact info  [facility] - Office  [phone]

## 2022-02-20 ENCOUNTER — Ambulatory Visit: Payer: Medicaid Other | Admitting: Internal Medicine

## 2022-03-01 DEATH — deceased

## 2022-08-28 IMAGING — CT CT NECK W/ CM
3 of 4 series · 12 of 33 positions shown, 14 images · IV contrast (Omnipaque or Isovue)
Comparison: Chest CT 01/28/2019.
COMPARISON: Chest CT 01/28/2019.

Addendum:
CLINICAL DATA: 59-year-old female with dysphagia. Tonsillitis
suspected.

EXAM:
CT NECK WITH CONTRAST
TECHNIQUE: Multidetector CT imaging of the neck was performed using the
standard protocol following the bolus administration of intravenous
contrast.
CONTRAST:  75mL OMNIPAQUE IOHEXOL 300 MG/ML  SOLN

[Series 2: axial neck · axial · 0.61mm/px · z∈[+83,+241]mm · 4 of 119 slices shown, 5 images]
[im 20/119  soft-tissue]
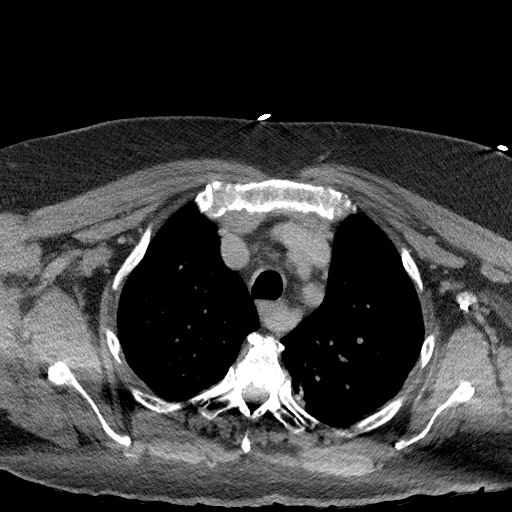
[im 20/119  bone]
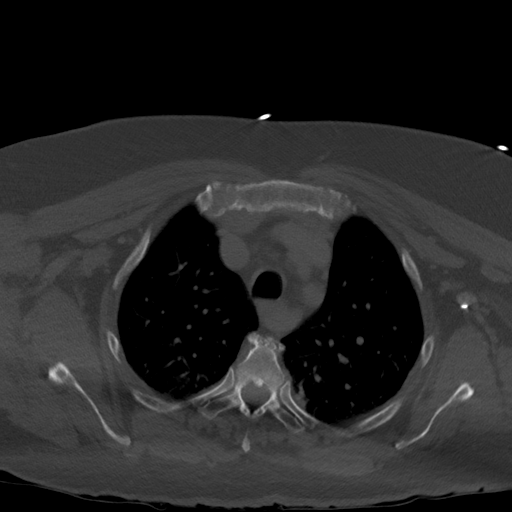
[im 40/119  bone]
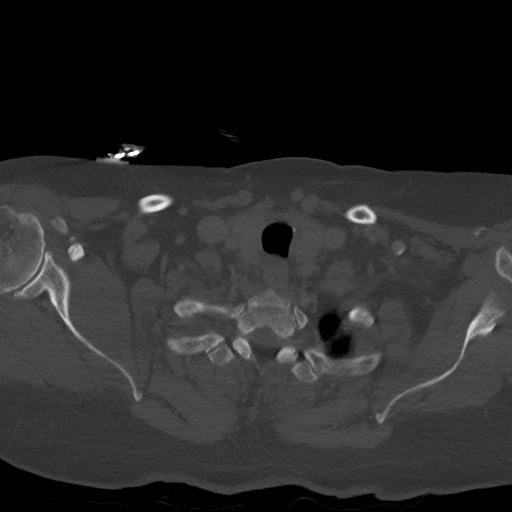
[im 79/119  bone]
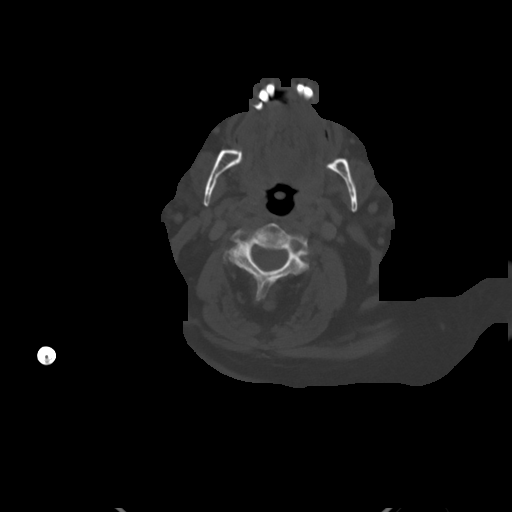
[im 99/119  bone]
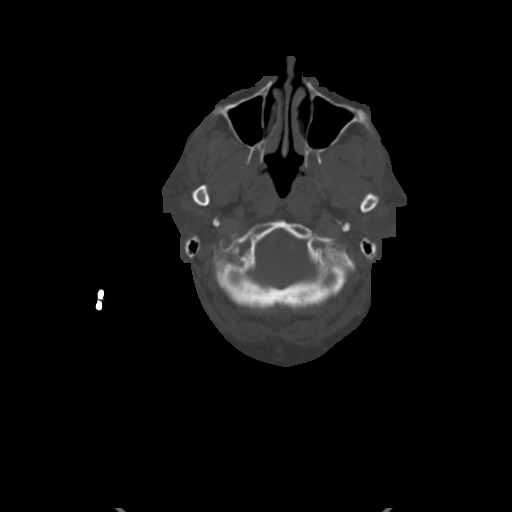

[Series 4: cor neck · coronal · 0.43mm/px · 3 of 134 slices shown]
[im 27/134  bone]
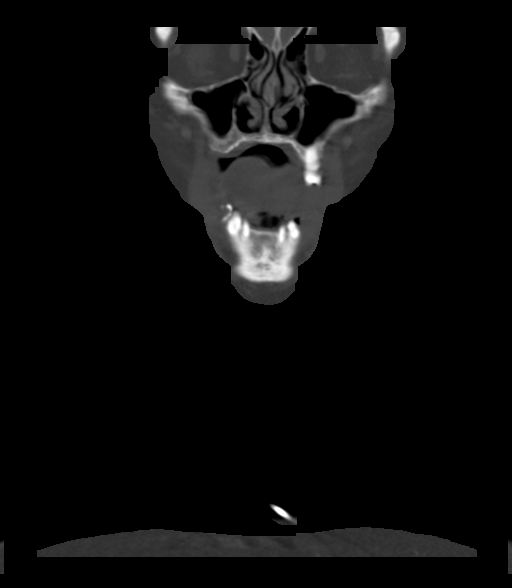
[im 54/134  bone]
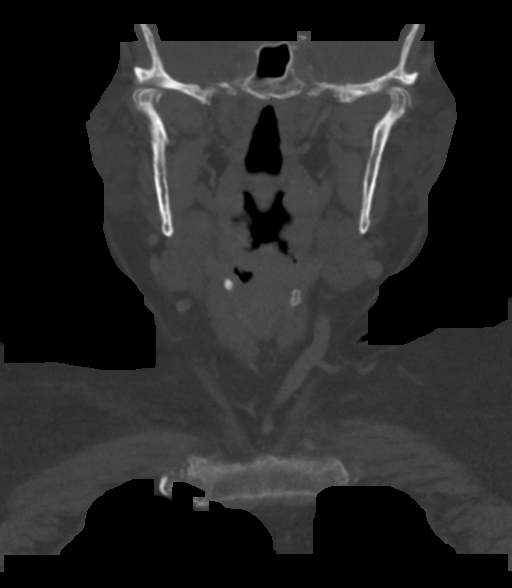
[im 80/134  bone]
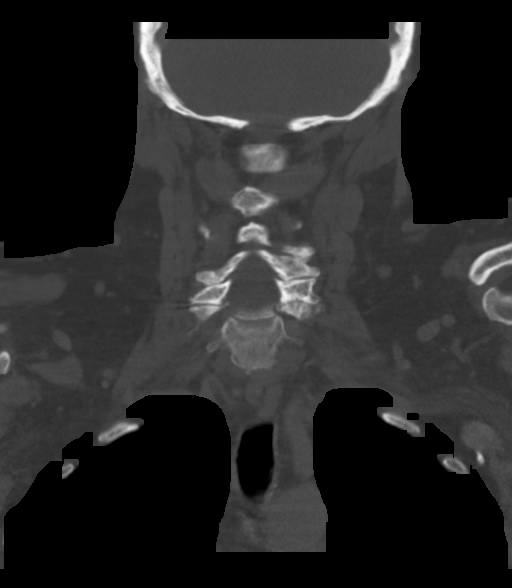

[Series 5: sag neck · sagittal · 0.50mm/px · 5 of 95 slices shown, 6 images]
[im 32/95  bone]
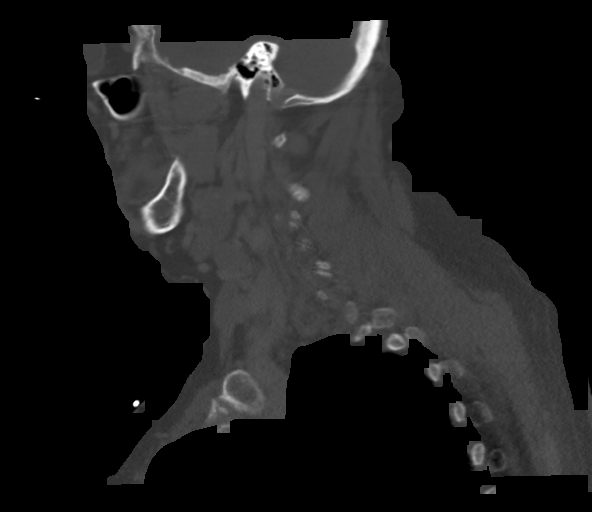
[im 40/95  bone]
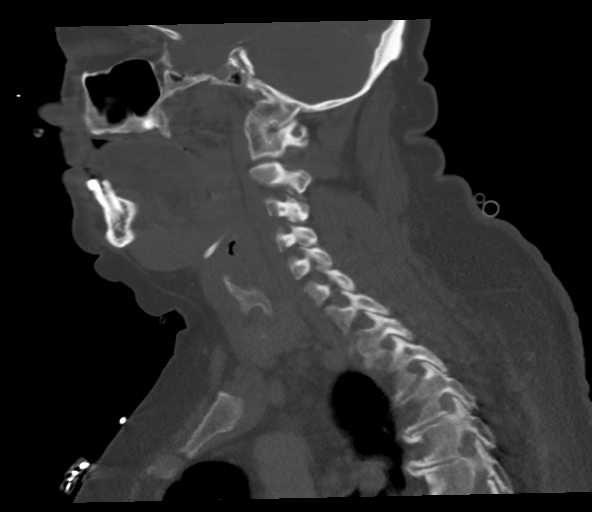
[im 48/95  soft-tissue]
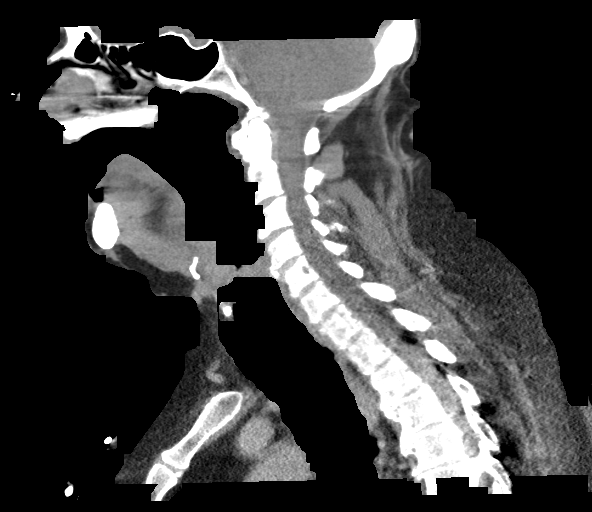
[im 48/95  bone]
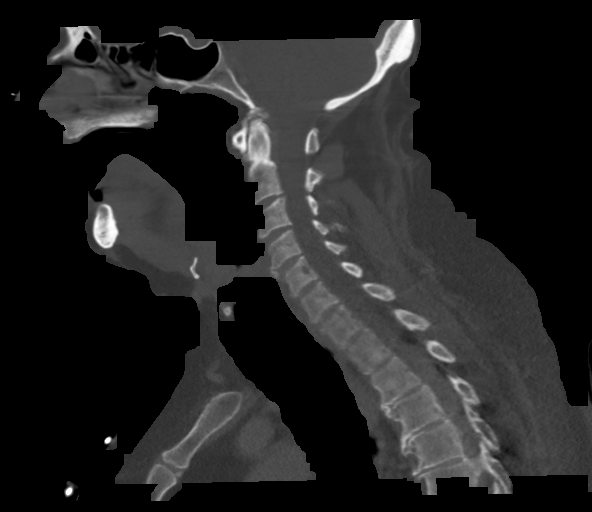
[im 55/95  bone]
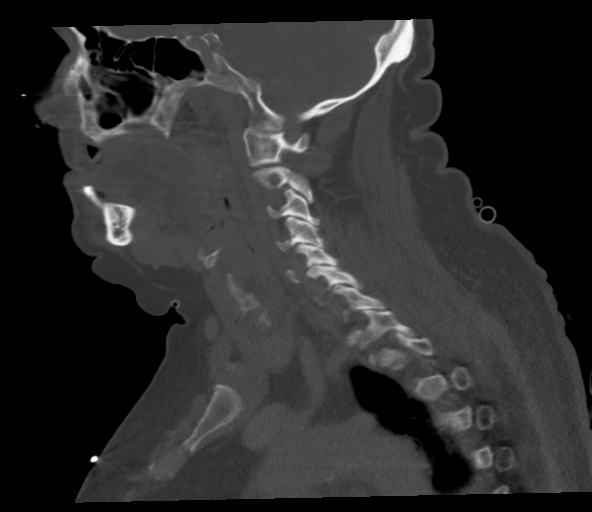
[im 63/95  bone]
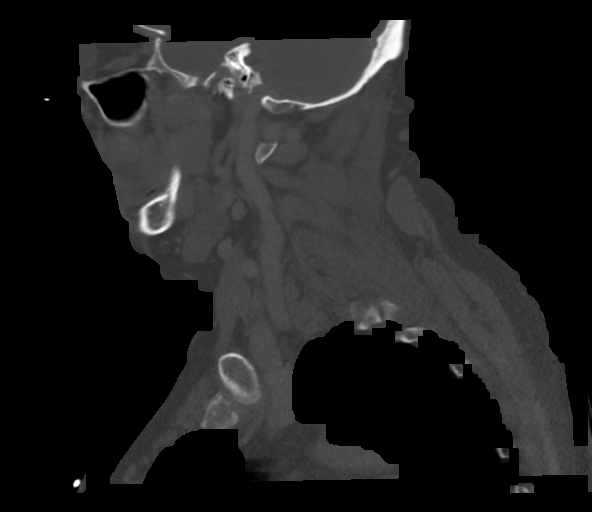

[12 of 33 positions shown; findings below may reference images not displayed]

FINDINGS: Pharynx and larynx: Bulky soft tissue mass throughout the bilateral
supraglottic larynx and affecting the hypopharynx. Lobulated diffuse
thickening of the epiglottis (series 2, image 53). Diffuse false
cord and anterior commissure involvement with evidence of extension
into the right paraglottic space on series 2 image 61 - where
nodular direct extension of tumor and/or inseparable abnormal right
level 3 lymph node protrudes on coronal image 58.

Asymmetric erosion of the undersurface of the right thyroid
cartilage on image 65. Asymmetric sclerosis of the right arytenoid
on image 67., and midline extension of tumor is suspected through
the anterior commissure a distance of about 11 mm as seen on series
2, image 67 and sagittal image 48. All told, tumor size is estimated
at 37 x 52 by 45 mm (AP by transverse by CC). There is evidence of
airway compromise.

The true cords may be spared as seen on series 2, image 71.
Subglottic larynx is within normal limits.

Above the vallecula pharyngeal contours appear normal. Normal
superior parapharyngeal and retropharyngeal spaces.

Salivary glands: Negative sublingual space. Submandibular glands and
parotid glands remain within normal limits.

Thyroid: Negative.

Lymph nodes: Malignant left level IIIb lymph node measures 17 mm
short axis and 29 mm long axis (series 2, image 55 and coronal image
73).

As stated above it is possible of malignant right level IIIa lymph
node is inseparable from the parent tumor along the right
paraglottic space (coronal image 58).

Smaller but asymmetrically enlarged left level 4 nodes measure up to
9 mm short axis (coronal image 72).

No other abnormal or suspicious lymph nodes identified.

Vascular: Major vascular structures in the neck and at the skull
base remain patent including both internal jugular veins. The left
vertebral artery appears dominant. Calcified atherosclerosis at the
skull base.

Limited intracranial: Negative.

Visualized orbits: Negative.

Mastoids and visualized paranasal sinuses: Clear.

Skeleton: Absent and carious dentition.

Cervical spine degeneration. No suspicious osseous lesion
identified.

Upper chest: Aberrant origin right subclavian artery (normal
variant). No superior mediastinal lymphadenopathy. Visible axillary
lymph nodes are normal.

Mild dependent atelectasis.  No upper lung nodule identified.
IMPRESSION: 1. Bulky bilateral supraglottic tumor with possible airway
compromise. Recommend ENT consultation.
Involvement of right laryngeal cartilages, with extension in the
midline anterior to the strap muscles, and also extension through
the right paraglottic space and/or inseparable malignant right level
IIIa lymph node.
Estimated tumor long axis 5.2 cm.

2. Malignant contralateral left level 3b lymph node is 2.9 cm long
axis. Smaller indeterminate left level 4 lymph nodes.

3. No distant metastatic disease identified in the neck or upper
chest.

ADDENDUM:
Study discussed by telephone with Dr. DON LOLITO ONACRAM on 03/11/2020
at 6276 hours.

*** End of Addendum ***
FINDINGS: Pharynx and larynx: Bulky soft tissue mass throughout the bilateral
supraglottic larynx and affecting the hypopharynx. Lobulated diffuse
thickening of the epiglottis (series 2, image 53). Diffuse false
cord and anterior commissure involvement with evidence of extension
into the right paraglottic space on series 2 image 61 - where
nodular direct extension of tumor and/or inseparable abnormal right
level 3 lymph node protrudes on coronal image 58.

Asymmetric erosion of the undersurface of the right thyroid
cartilage on image 65. Asymmetric sclerosis of the right arytenoid
on image 67., and midline extension of tumor is suspected through
the anterior commissure a distance of about 11 mm as seen on series
2, image 67 and sagittal image 48. All told, tumor size is estimated
at 37 x 52 by 45 mm (AP by transverse by CC). There is evidence of
airway compromise.

The true cords may be spared as seen on series 2, image 71.
Subglottic larynx is within normal limits.

Above the vallecula pharyngeal contours appear normal. Normal
superior parapharyngeal and retropharyngeal spaces.

Salivary glands: Negative sublingual space. Submandibular glands and
parotid glands remain within normal limits.

Thyroid: Negative.

Lymph nodes: Malignant left level IIIb lymph node measures 17 mm
short axis and 29 mm long axis (series 2, image 55 and coronal image
73).

As stated above it is possible of malignant right level IIIa lymph
node is inseparable from the parent tumor along the right
paraglottic space (coronal image 58).

Smaller but asymmetrically enlarged left level 4 nodes measure up to
9 mm short axis (coronal image 72).

No other abnormal or suspicious lymph nodes identified.

Vascular: Major vascular structures in the neck and at the skull
base remain patent including both internal jugular veins. The left
vertebral artery appears dominant. Calcified atherosclerosis at the
skull base.

Limited intracranial: Negative.

Visualized orbits: Negative.

Mastoids and visualized paranasal sinuses: Clear.

Skeleton: Absent and carious dentition.

Cervical spine degeneration. No suspicious osseous lesion
identified.

Upper chest: Aberrant origin right subclavian artery (normal
variant). No superior mediastinal lymphadenopathy. Visible axillary
lymph nodes are normal.

Mild dependent atelectasis.  No upper lung nodule identified.
IMPRESSION: 1. Bulky bilateral supraglottic tumor with possible airway
compromise. Recommend ENT consultation.
Involvement of right laryngeal cartilages, with extension in the
midline anterior to the strap muscles, and also extension through
the right paraglottic space and/or inseparable malignant right level
IIIa lymph node.
Estimated tumor long axis 5.2 cm.

2. Malignant contralateral left level 3b lymph node is 2.9 cm long
axis. Smaller indeterminate left level 4 lymph nodes.

3. No distant metastatic disease identified in the neck or upper
chest.

## 2022-09-20 IMAGING — DX DG CHEST 1V PORT
1 series · 1 of 1 positions shown · non-contrast
Comparison: 03/31/2020

CLINICAL DATA: Increasing shortness of breath

EXAM:
PORTABLE CHEST 1 VIEW

[chest ap]
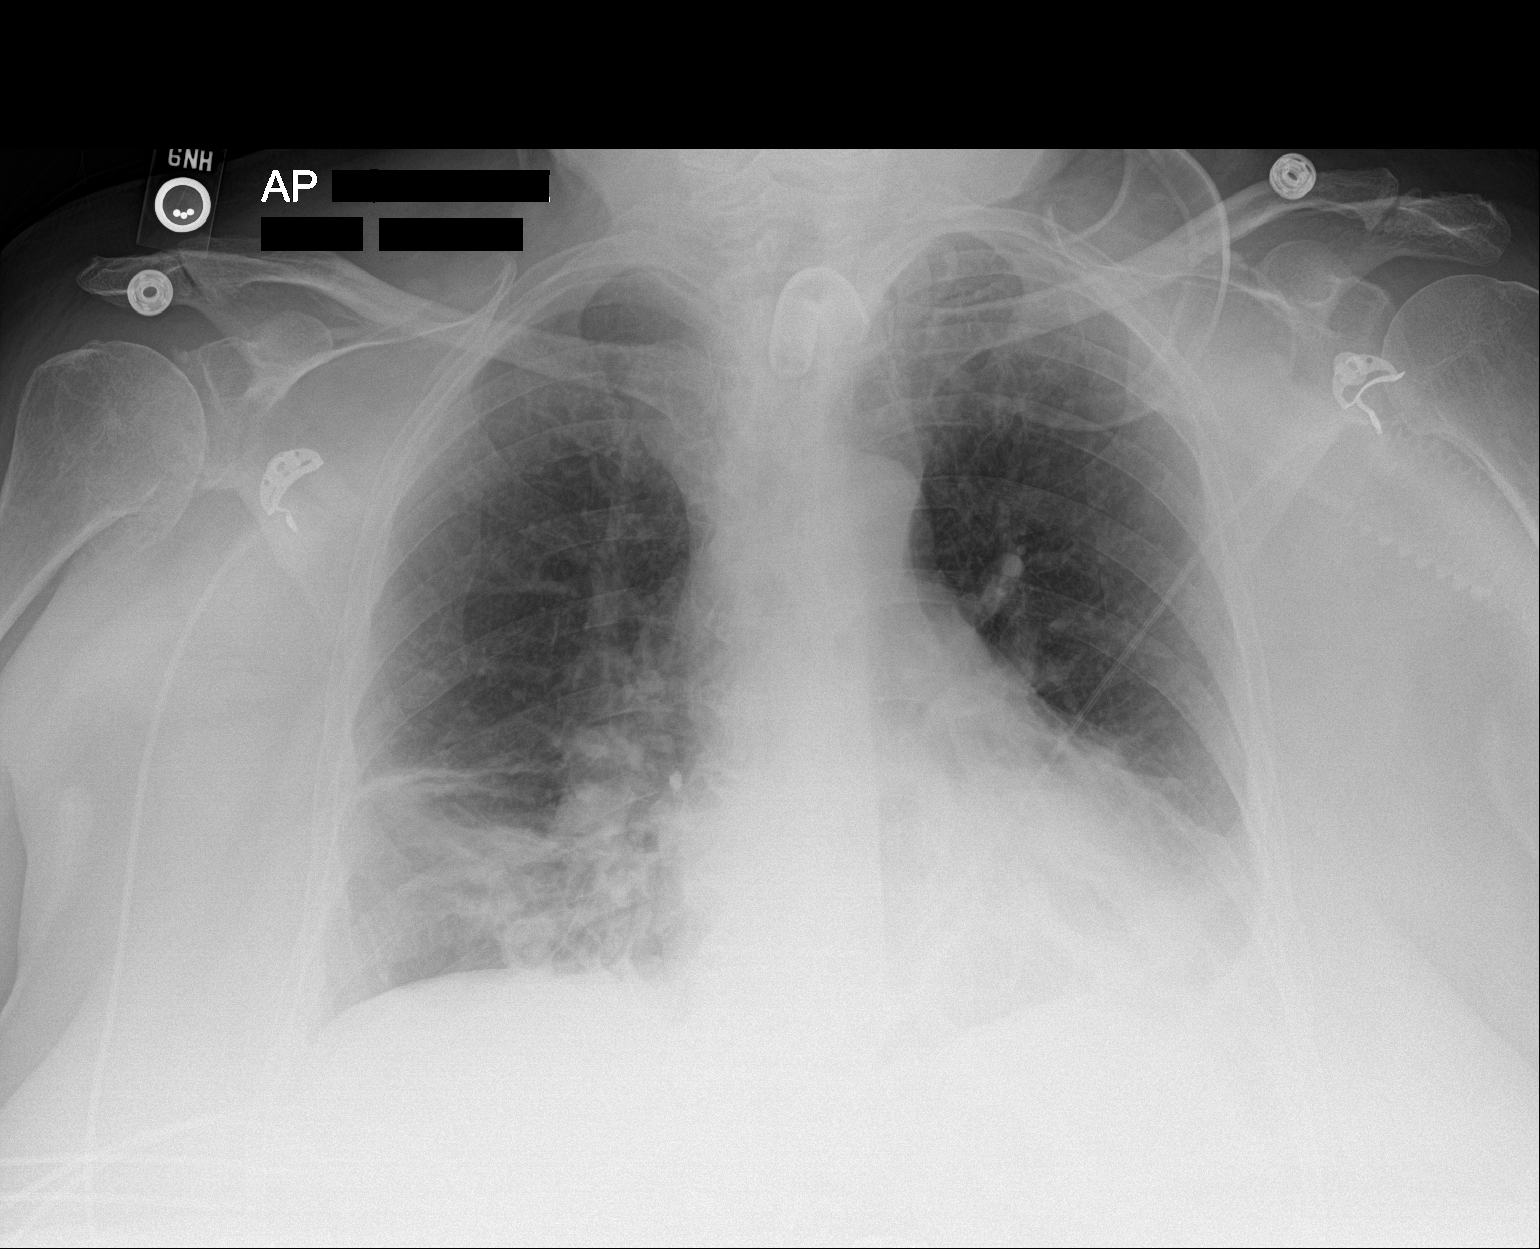

[1 of 1 positions shown; findings below may reference images not displayed]

FINDINGS: Tracheostomy tube is noted in satisfactory position. Cardiac shadow
is enlarged but stable. Some persistent bibasilar opacities are
noted although mildly improved when compared with the prior exam. No
new focal abnormality is noted.
IMPRESSION: Improved aeration as described.

## 2022-09-24 IMAGING — DX DG ABD PORTABLE 1V
1 series · 2 of 2 positions shown · non-contrast
Comparison: CT 03/15/2020

CLINICAL DATA: Abdomen pain

EXAM:
PORTABLE ABDOMEN - 1 VIEW

[Series 1: abdomen kub · 0.14mm/px · 2 of 2 slices shown]
[im 1/2]
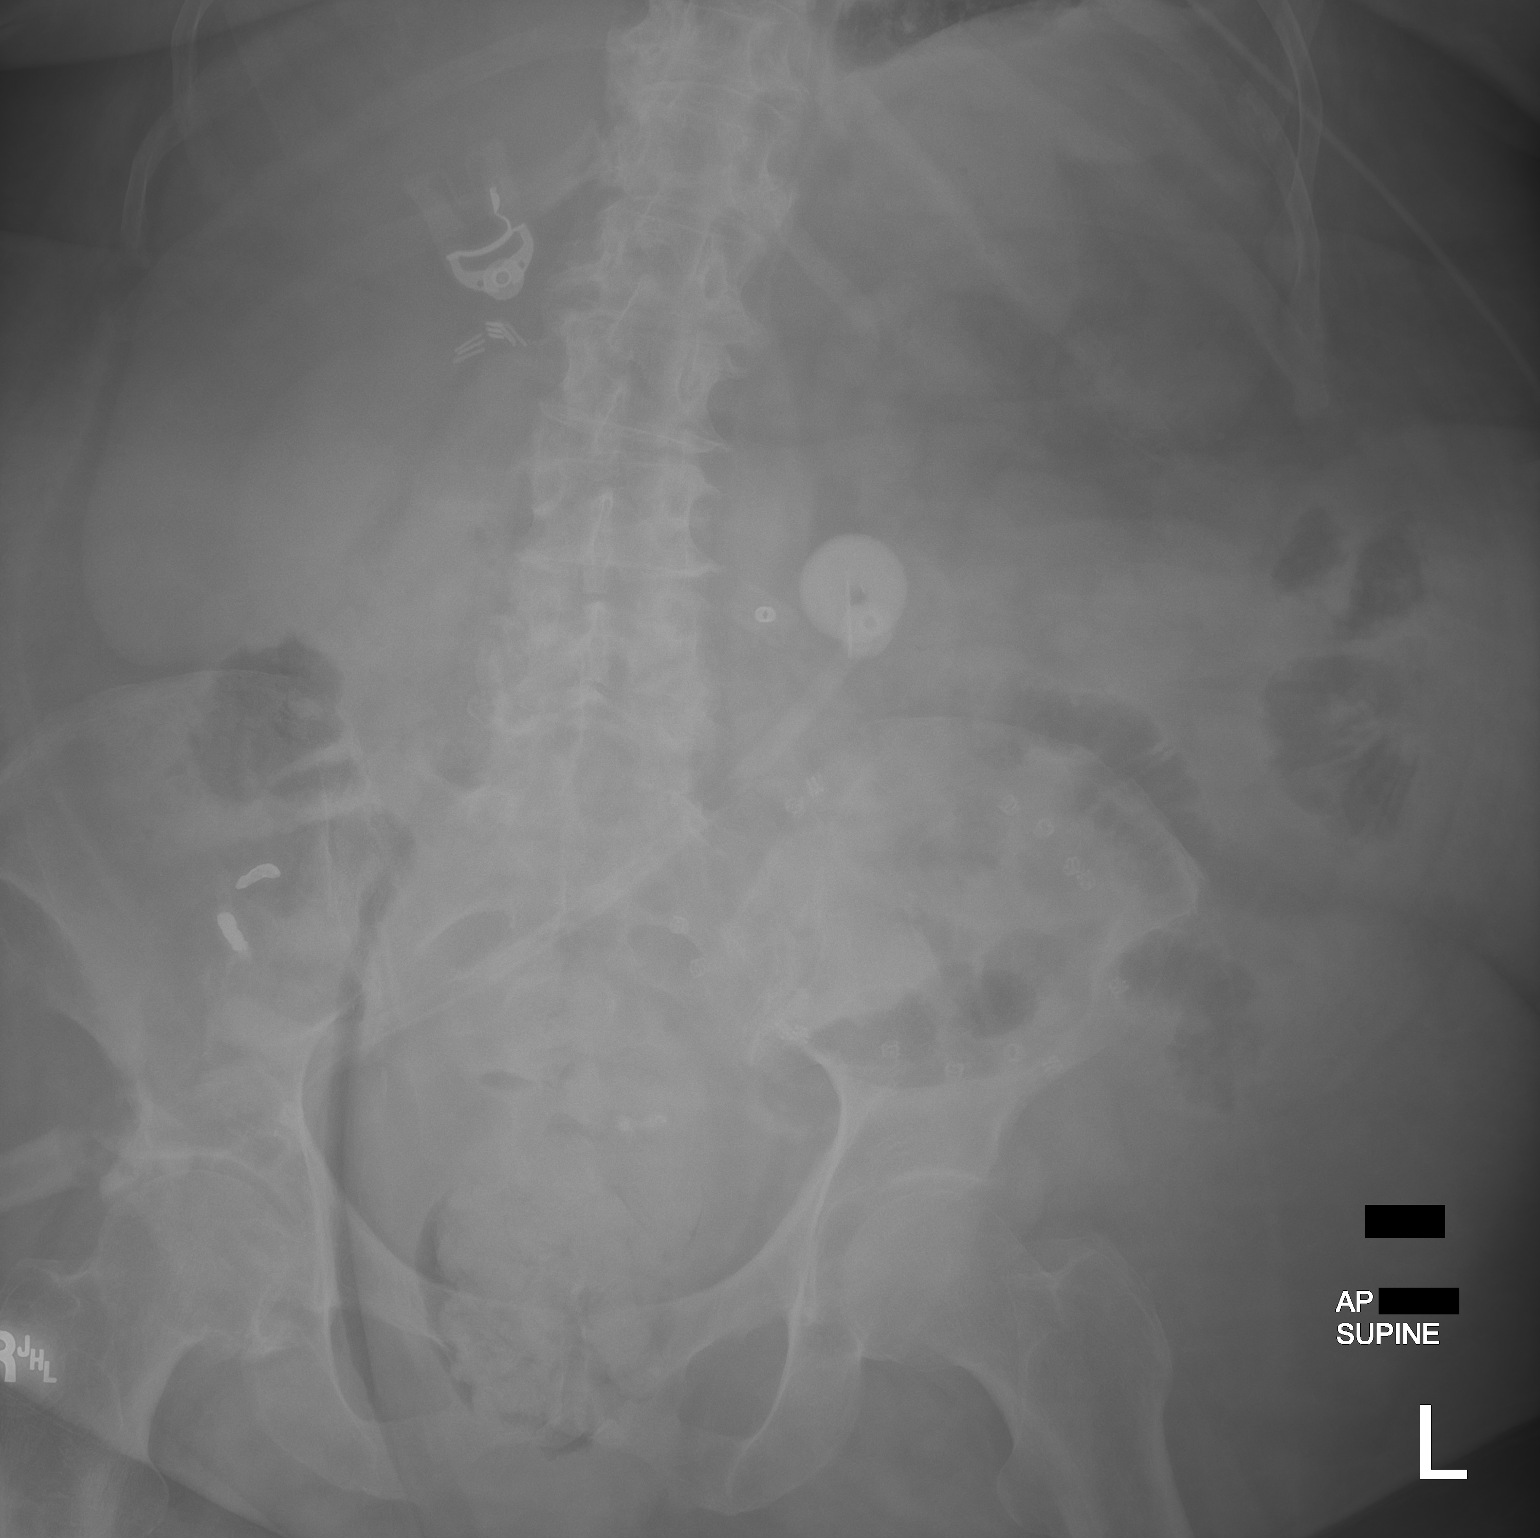
[im 2/2]
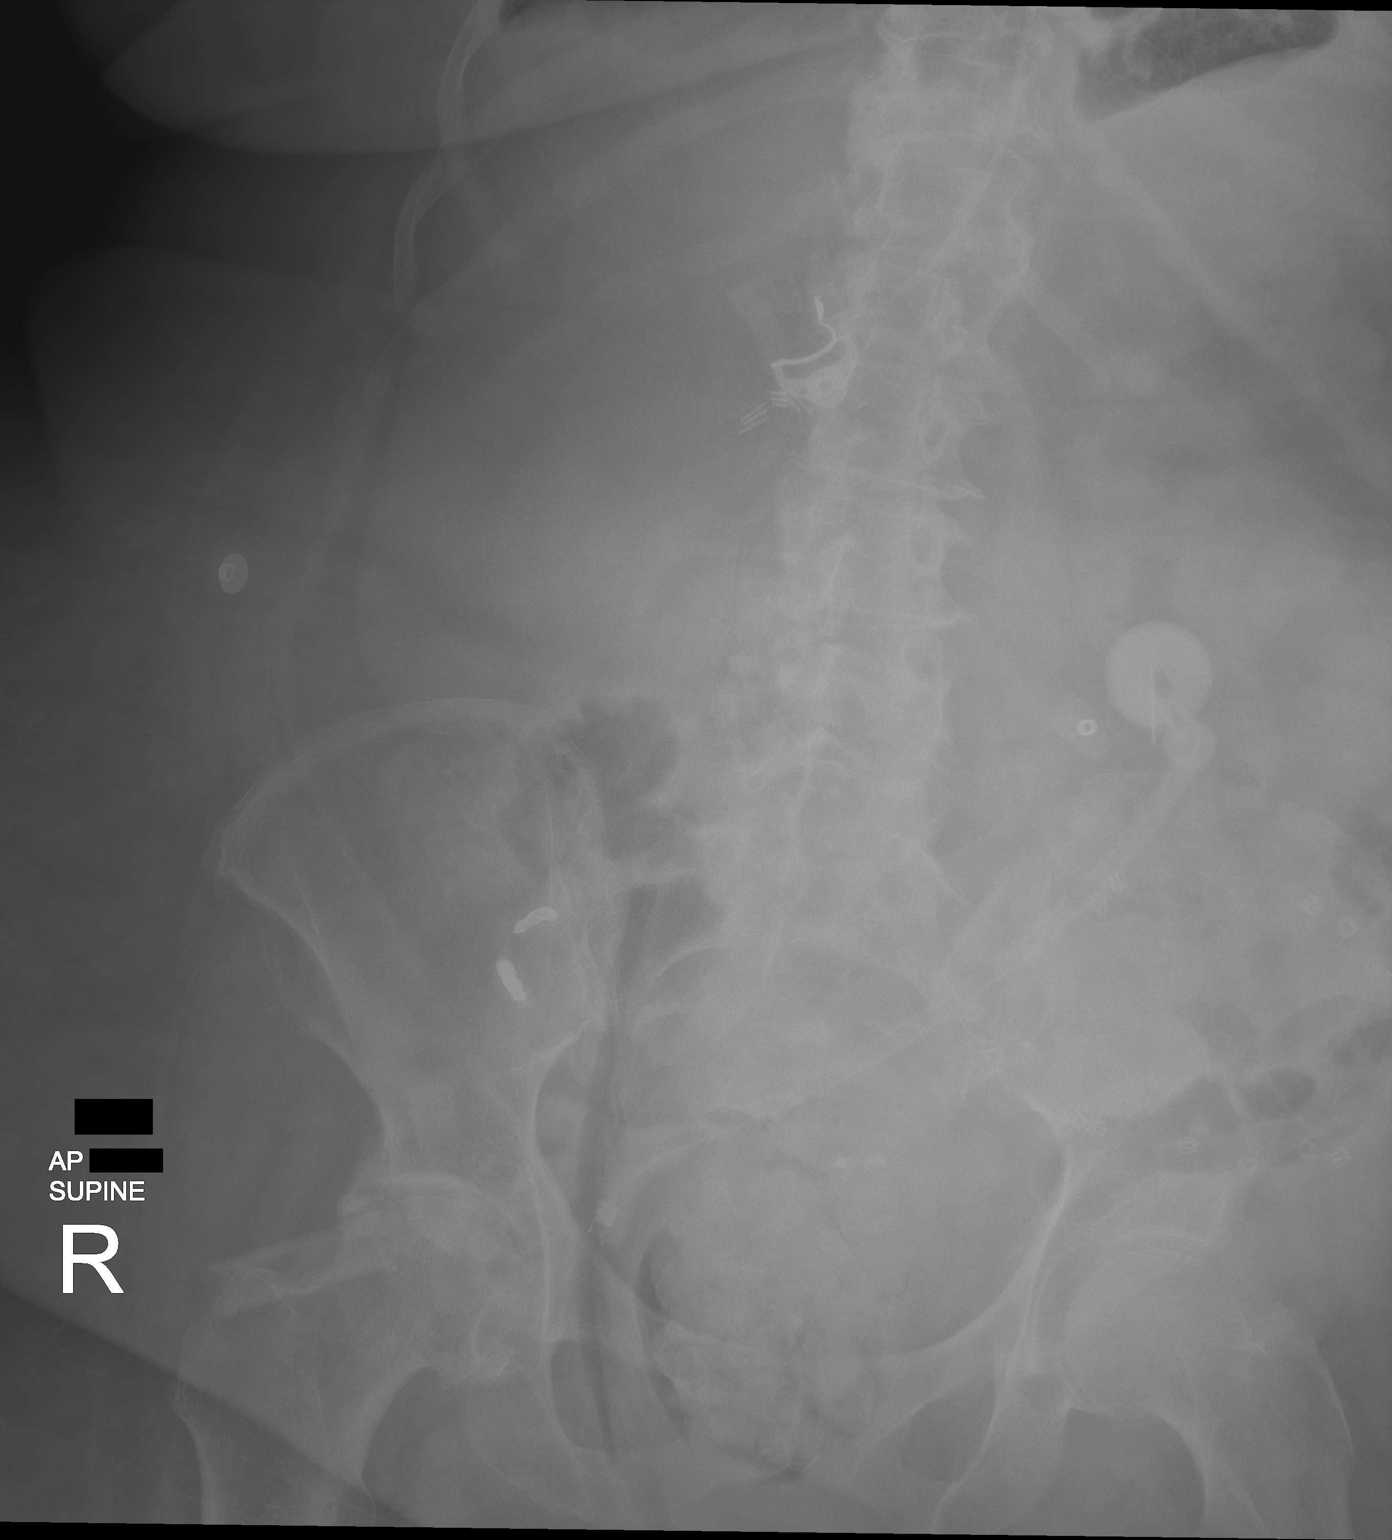

[2 of 2 positions shown; findings below may reference images not displayed]

FINDINGS: Gastrostomy tube in the left mid abdomen. Nonobstructed bowel-gas
pattern. Evidence of prior hernia repair. Curvilinear densities in
the right lower quadrant, possible calcifications or retained
contrast within appendix.
IMPRESSION: Nonobstructed gas pattern.
